# Patient Record
Sex: Male | Born: 1960 | Race: Black or African American | Hispanic: No | Marital: Single | Smoking: Current every day smoker
Health system: Southern US, Community
[De-identification: ages and names within clinical notes are randomized; demographics above are authoritative.]

## PROBLEM LIST (undated history)

## (undated) DIAGNOSIS — K259 Gastric ulcer, unspecified as acute or chronic, without hemorrhage or perforation: Secondary | ICD-10-CM

## (undated) DIAGNOSIS — I429 Cardiomyopathy, unspecified: Secondary | ICD-10-CM

## (undated) DIAGNOSIS — Z95811 Presence of heart assist device: Secondary | ICD-10-CM

## (undated) DIAGNOSIS — I509 Heart failure, unspecified: Secondary | ICD-10-CM

## (undated) DIAGNOSIS — M549 Dorsalgia, unspecified: Secondary | ICD-10-CM

## (undated) DIAGNOSIS — G8929 Other chronic pain: Secondary | ICD-10-CM

## (undated) DIAGNOSIS — Z8739 Personal history of other diseases of the musculoskeletal system and connective tissue: Secondary | ICD-10-CM

## (undated) DIAGNOSIS — I10 Essential (primary) hypertension: Secondary | ICD-10-CM

## (undated) DIAGNOSIS — K529 Noninfective gastroenteritis and colitis, unspecified: Secondary | ICD-10-CM

## (undated) DIAGNOSIS — I517 Cardiomegaly: Secondary | ICD-10-CM

## (undated) DIAGNOSIS — E119 Type 2 diabetes mellitus without complications: Secondary | ICD-10-CM

## (undated) HISTORY — DX: Other chronic pain: G89.29

## (undated) HISTORY — DX: Personal history of other diseases of the musculoskeletal system and connective tissue: Z87.39

## (undated) HISTORY — DX: Cardiomyopathy, unspecified: I42.9

## (undated) HISTORY — PX: ORCHIECTOMY: SHX2116

## (undated) HISTORY — PX: LUMBAR LAMINECTOMY/DECOMPRESSION MICRODISCECTOMY: SHX5026

## (undated) HISTORY — DX: Gastric ulcer, unspecified as acute or chronic, without hemorrhage or perforation: K25.9

## (undated) HISTORY — DX: Noninfective gastroenteritis and colitis, unspecified: K52.9

## (undated) HISTORY — DX: Dorsalgia, unspecified: M54.9

---

## 2000-08-25 ENCOUNTER — Emergency Department (HOSPITAL_COMMUNITY): Admission: EM | Admit: 2000-08-25 | Discharge: 2000-08-25 | Payer: Self-pay | Admitting: *Deleted

## 2001-01-30 ENCOUNTER — Encounter: Payer: Self-pay | Admitting: Orthopedic Surgery

## 2001-01-30 ENCOUNTER — Ambulatory Visit (HOSPITAL_COMMUNITY): Admission: RE | Admit: 2001-01-30 | Discharge: 2001-01-30 | Payer: Self-pay | Admitting: Orthopedic Surgery

## 2001-02-17 ENCOUNTER — Encounter: Admission: RE | Admit: 2001-02-17 | Discharge: 2001-02-17 | Payer: Self-pay | Admitting: Orthopedic Surgery

## 2001-02-17 ENCOUNTER — Encounter: Payer: Self-pay | Admitting: Orthopedic Surgery

## 2001-03-04 ENCOUNTER — Encounter: Payer: Self-pay | Admitting: Orthopedic Surgery

## 2001-03-04 ENCOUNTER — Encounter: Admission: RE | Admit: 2001-03-04 | Discharge: 2001-03-04 | Payer: Self-pay | Admitting: Orthopedic Surgery

## 2001-03-20 ENCOUNTER — Encounter: Admission: RE | Admit: 2001-03-20 | Discharge: 2001-03-20 | Payer: Self-pay | Admitting: Orthopedic Surgery

## 2001-03-20 ENCOUNTER — Encounter: Payer: Self-pay | Admitting: Orthopedic Surgery

## 2001-04-02 ENCOUNTER — Emergency Department (HOSPITAL_COMMUNITY): Admission: EM | Admit: 2001-04-02 | Discharge: 2001-04-02 | Payer: Self-pay | Admitting: Emergency Medicine

## 2001-04-02 ENCOUNTER — Encounter: Payer: Self-pay | Admitting: Emergency Medicine

## 2002-07-21 ENCOUNTER — Encounter: Payer: Self-pay | Admitting: Orthopedic Surgery

## 2002-07-21 ENCOUNTER — Encounter: Admission: RE | Admit: 2002-07-21 | Discharge: 2002-07-21 | Payer: Self-pay | Admitting: Orthopedic Surgery

## 2002-08-05 ENCOUNTER — Encounter: Payer: Self-pay | Admitting: Orthopedic Surgery

## 2002-08-05 ENCOUNTER — Encounter: Admission: RE | Admit: 2002-08-05 | Discharge: 2002-08-05 | Payer: Self-pay | Admitting: Orthopedic Surgery

## 2008-02-27 ENCOUNTER — Inpatient Hospital Stay (HOSPITAL_COMMUNITY): Admission: EM | Admit: 2008-02-27 | Discharge: 2008-02-28 | Payer: Self-pay | Admitting: Emergency Medicine

## 2008-05-03 ENCOUNTER — Emergency Department (HOSPITAL_COMMUNITY): Admission: EM | Admit: 2008-05-03 | Discharge: 2008-05-03 | Payer: Self-pay | Admitting: Emergency Medicine

## 2011-04-02 NOTE — Discharge Summary (Signed)
Theodore Welch, Theodore Welch              ACCOUNT NO.:  [id number]   MEDICAL RECORD NO.:  [id number]          PATIENT TYPE:  INP   LOCATION:  5030                         FACILITY:  [facility]   PHYSICIAN:  Herbie Saxon, MDDATE OF BIRTH:  Apr 27, 1961   DATE OF ADMISSION:  02/26/2008  DATE OF DISCHARGE:  02/28/2008                               DISCHARGE SUMMARY   DISCHARGE DIAGNOSES:  1. Diffuse ileus, resolved.  2. Gastroenteritis, improved.  3. Chronic back pain.  4. History of gastric ulcer.  5. History of degenerative disk disease.  6. History of seasonal allergies.   CONSULTS:  None.   PROCEDURES:  None.   RADIOLOGY:  The abdominal x-ray of February 28, 2008 shows nonspecific  bowel gas pattern.  A CT abdomen on February 26, 2008 was consistent with  diffuse  ileus.  No definite obstruction seen.  Note that abdomen x-ray  was on February 28, 2008.   HOSPITAL COURSE:  This 50 year old African American male presented to  the emergency room complaining of ongoing nausea, vomiting, and diarrhea  for about 10 days and was having severe abdominal cramps 3 days prior to  presentation.  The CT of abdomen on presentation showed diffuse ileus.  He was put on nothing oral, started on IV fluid hydration, p.r.n. Reglan  alternating with Phenergan, started on Protonix 40 mg IV daily.  He  declined nicotine patch and Lovenox for DVT prophylaxis.  However, with  bowel rest his abdominal cramps and vomiting has subsided, was started  on IV Cipro and Flagyl and diarrhea has also improved.  He is anxious to  be discharged today.  However, he was noted to be having moderate  hypertension on this admission, and he is going to be started on  amlodipine 5 mg daily on discharge.  The patient is tolerating thin  liquid diet and has been advanced to regular.  He is to be discharged to  home tonight if he tolerates lunch.   DISCHARGE CONDITION:  Unstable.   DIET:  To be heart healthy.   ACTIVITY:  No  restrictions.   FOLLOWUP:  Follow up with his primary care physician at the [facility]  in [place] in 5-7 days.   MEDICATIONS ON DISCHARGE:  1. Cipro 500 mg twice daily for 5 days.  2. Flagyl 500 mg t.i.d. for 5 days.  3. Imodium 2 mg p.o. every 8 hours for nausea.  4. Prilosec 20 p.o. daily.  5. Phenergan 12.5 mg p.o. every 8 hours as needed.  6. Amlodipine 5 mg p.o. daily.   His blood count needs to be monitored as an outpatient by the primary  care physician.  The patient has been counseled extensively on the need  to stop smoking cigarettes.   PHYSICAL EXAMINATION:  GENERAL:  On examination today, he is a middle-  aged male in no acute distress.  VITAL SIGNS:  Temperature is 98, pulse is 58, respiratory rate is 18,  and blood pressure is 153/94.  HEENT:  Pupils are equal and reacting to light and accommodation.  NECK:  Supple.  Oropharynx and the pharynx  are clear.  HEAD:  Atraumatic and normocephalic.  There is no elevated JVD.  No  thyromegaly.  No carotid bruits.  No submandibular lymphadenopathy.  CHEST:  Clinically clear.  No chest wall deformity or tenderness on the  ribcage.  Heart sounds 1 and 2, regular, and no murmurs, no heaves, no  thrill.  ABDOMEN:  Soft, nontender, no organomegaly.  Bowel sounds are  normoactive.  Inguinal orifices are patent.  NEUROLOGIC:  He is alert and oriented to time, place, and person.  He is  5 globally.  Deep tendon reflexes are 2+ globally.  Peripheral pulses  present.  No pedal edema.   LABORATORY DATA:  Sodium 137, potassium 4.1, chloride 107, bicarbonate  24, glucose 108, BUN 8, creatinine 1.0.  WBC 8.9, hematocrit 38.6, and  platelet count is 250.   Discharge greater than 30 minutes.      Herbie Saxon, MD  Electronically Signed     MIO/MEDQ  D:  02/28/2008  T:  02/29/2008  Job:  [id number]

## 2011-04-02 NOTE — H&P (Signed)
NAMEBRODRICK, CURRAN              ACCOUNT NO.:  [id number]   MEDICAL RECORD NO.:  [id number]          PATIENT TYPE:  INP   LOCATION:  5030                         FACILITY:  [facility]   PHYSICIAN:  Mobolaji B. Bakare, M.D.DATE OF BIRTH:  [birth year]   DATE OF ADMISSION:  02/26/2008  DATE OF DISCHARGE:                              HISTORY & PHYSICAL   PRIMARY CARE PHYSICIAN:  Unassigned.  Patient goes to [place] in [place].   CHIEF COMPLAINT:  Nausea, vomiting, diarrhea for about 10 days.   HISTORY OF PRESENTING COMPLAINT:  [name] is a 50 year old African-  American male with medical history of chronic back pain, degenerative  disk disease, and gastric ulcer about six years ago.  He was in his  usual state of health until last week when he developed nausea,  vomiting, diarrhea, associated with fever.  There was a history of a  sick contact.  These symptoms appeared to have been waxing and waning.  The abdominal pain especially was crampy in nature, associated with  nausea and vomiting, but he also had diarrhea and this lasted for about  two days last week.  He got somewhat better over the weekend, then the  symptoms recurred again on Monday.  He continues to have nausea and  abdominal pain, but the diarrhea seems to have subsided since three days  ago.  His current problem now is nausea and abdominal cramps.  He had a  CT scan of the abdomen done in the emergency room, which was negative  for intestinal obstruction.  Patient has an ileus noted on this CT.  His  potassium is 3.6.  He currently denies anymore fever.   REVIEW OF SYSTEMS:  No shortness of breath, chest pain, headaches.  There is no dysuria or increasing frequency of micturition.  Patient has  been unable to tolerate p.o. meals at all.   PAST MEDICAL HISTORY:  1. Chronic back pain.  2. A history of gastric ulcer six years ago.  Apparently from      patient's description, it was diagnosed on barium swallow and  follow-through.  3. History of allergies.  4. Degenerative disk disease.   PAST SURGICAL HISTORY:  Left testicular excision, likely secondary to  torsion, from patient's description.   CURRENT MEDICATIONS:  1. Acetaminophen 650 mg p.r.n.  2. Tramadol/acetaminophen 50 mg b.i.d.  3. Baclofen 10 mg t.i.d.  4. Chlorpheniramine 4 mg q.8h. p.r.n.   ALLERGIES:  No known drug allergies.   FAMILY HISTORY:  Both parents are deceased.  Father passed away at the  age of 25 from probable MI.  Mother had brain aneurysm in early 26s as  well.   SOCIAL HISTORY:  Patient is laid off.  He was a Naval architect.  He smokes  half a pack of cigarettes a day.  He has been smoking for about 25  years.  He is a retired Education administrator.  He lives with family.  Additionally,  he drinks alcohol, 1-2 beers per weekend.  He is single.   PHYSICAL EXAMINATION:  INITIAL VITALS:  Temperature 97.6, blood pressure  144/94, pulse 78,  respiratory rate 18, O2 sats 96%.  GENERAL:  Patient is awake, alert and oriented to time, place, and  person.  Normocephalic and atraumatic head.  Pupils are equal, round and reactive  to light.  Extraocular muscle movement intact.  No elevated JVD.  No  carotid bruit.  Mucous membranes moist.  No oral thrush.  LUNGS:  Clear clinically to auscultation.  CVS:  S1 and S2.  No murmur or gallop.  No rub.  ABDOMEN:  Nondistended.  It is soft.  There is mild periumbilical  tenderness without guarding or rebound.  Bowel sounds are hypoactive.  No inguinal hernias noted.  EXTREMITIES:  No pedal edema or calf tenderness.  Dorsalis pedis pulses  palpable bilaterally.  CNS:  No focal neurological deficits.   LABORATORY DATA:  White cell count 11.2, hemoglobin 13.5, hematocrit  44.5, platelets 305.  Normal differential.  Sodium 126, potassium 3.6,  chloride 103, CO2 25, glucose 104, BUN 11, creatinine 0.93, calcium 9.1,  total protein 6.7, albumin 4.1, AST 60, ALT 16, lipase 30.   X-ray of abdomen  showed air/fluid levels in the right lower quadrant,  probable partial small bowel obstruction versus reflex ileus.   CT scan of the abdomen and pelvis showed fluid in both large and small  bowels, that is ileus.  The pattern is most consistent with diffuse  ileus.  On CT of the abdomen, no definite bowel obstruction was seen.  Urinary bladder is unremarkable.  Prostate is normal in size.  No  colonic abnormality noted.   ASSESSMENT/PLAN:  [name] is a 50 year old African-American male  presenting with nausea, vomiting, and diarrhea, which has been  intermittent the last 10 days.__________ does include nausea and  abdominal cramp.  An abdominal x-ray and CT scan of the abdomen suggests  ileus.  Potassium is on the low side of normal at 3.6.  He is  hemodynamically stable.  He has mild leukocytosis.  He will be admitted  for further management.   ADMISSION DIAGNOSES:  1. Nausea, vomiting, diarrhea:  Likely viral gastroenteritis.  Patient      will be placed on a clear-liquid diet, IV fluids of normal saline      at 150 cc/hr with 20 mEq of potassium in the fluid x2 liters, then      reduce rates to 125 cc/hr.  Will give Phenergan 12.5 mg q.4h.      p.r.n.  Protonix 40 mg daily.  2. Ileus, likely secondary to gastroenteritis.  Would optimize      potassium and supplement potassium in IV fluids.  Will ambulate      p.r.n.  Give low dose Reglan at 5 mg q.8h., noting that patient      does not have anymore diarrhea at this time but would discontinue      Reglan should diarrhea recur.  3. Tobacco abuse:  Will give nicotine patch 14 mg daily and offer      tobacco cessation counseling.      Mobolaji B. Corky Downs, M.D.  Electronically Signed     MBB/MEDQ  D:  02/27/2008  T:  02/27/2008  Job:  [id number]

## 2011-08-13 LAB — COMPREHENSIVE METABOLIC PANEL
ALT: 11
ALT: 16
AST: 16
Albumin: 3.2 — ABNORMAL LOW
Alkaline Phosphatase: 86
Calcium: 9.1
Chloride: 109
Creatinine, Ser: 0.93
GFR calc Af Amer: >60
GFR calc Af Amer: >60
Glucose, Bld: 104 — ABNORMAL HIGH
Potassium: 4.1
Sodium: 136
Sodium: 142
Total Bilirubin: 1.3 — ABNORMAL HIGH
Total Protein: 5.2 — ABNORMAL LOW
Total Protein: 6.7

## 2011-08-13 LAB — URINALYSIS, ROUTINE W REFLEX MICROSCOPIC
Glucose, UA: NEGATIVE
Ketones, ur: 15 — AB
Nitrite: NEGATIVE
Specific Gravity, Urine: >1.046 — ABNORMAL HIGH
pH: 6

## 2011-08-13 LAB — CBC
Hemoglobin: 12.9 — ABNORMAL LOW
MCHC: 33.3
MCHC: 34.7
MCV: 90.2
Platelets: 250
RDW: 13.6
RDW: 13.8

## 2011-08-13 LAB — DIFFERENTIAL
Eosinophils Absolute: 0.4
Lymphocytes Relative: 19
Lymphs Abs: 2.1
Monocytes Relative: 9
Neutrophils Relative %: 67

## 2011-08-13 LAB — BASIC METABOLIC PANEL
BUN: 8
CO2: 24
Calcium: 8.3 — ABNORMAL LOW
GFR calc non Af Amer: >60
Glucose, Bld: 108 — ABNORMAL HIGH

## 2011-08-15 LAB — URINALYSIS, ROUTINE W REFLEX MICROSCOPIC
Bilirubin Urine: NEGATIVE
Glucose, UA: NEGATIVE
Ketones, ur: NEGATIVE
Protein, ur: NEGATIVE
Urobilinogen, UA: 0.2

## 2011-08-15 LAB — CBC
Hemoglobin: 15.9
MCHC: 34
RBC: 5.13
RDW: 13.3

## 2011-08-15 LAB — COMPREHENSIVE METABOLIC PANEL
ALT: 31
AST: 29
Alkaline Phosphatase: 105
Calcium: 9.3
GFR calc Af Amer: >60
Glucose, Bld: 120 — ABNORMAL HIGH
Potassium: 3.9
Sodium: 139
Total Protein: 6.9

## 2011-08-15 LAB — DIFFERENTIAL
Basophils Relative: 1
Eosinophils Absolute: 0.5
Eosinophils Relative: 5
Lymphs Abs: 1.9
Monocytes Absolute: 1
Monocytes Relative: 9

## 2011-08-15 LAB — URINE MICROSCOPIC-ADD ON

## 2013-11-15 DIAGNOSIS — M48062 Spinal stenosis, lumbar region with neurogenic claudication: Secondary | ICD-10-CM | POA: Insufficient documentation

## 2013-12-07 DIAGNOSIS — Z9889 Other specified postprocedural states: Secondary | ICD-10-CM | POA: Insufficient documentation

## 2014-01-24 DIAGNOSIS — M5416 Radiculopathy, lumbar region: Secondary | ICD-10-CM | POA: Insufficient documentation

## 2017-01-27 ENCOUNTER — Encounter: Payer: Non-veteran care | Attending: Physical Medicine & Rehabilitation

## 2017-01-27 ENCOUNTER — Encounter: Payer: Self-pay | Admitting: Physical Medicine & Rehabilitation

## 2017-01-27 ENCOUNTER — Ambulatory Visit (HOSPITAL_BASED_OUTPATIENT_CLINIC_OR_DEPARTMENT_OTHER): Payer: Non-veteran care | Admitting: Physical Medicine & Rehabilitation

## 2017-01-27 VITALS — BP 116/100 | HR 77 | Resp 14

## 2017-01-27 DIAGNOSIS — G894 Chronic pain syndrome: Secondary | ICD-10-CM | POA: Diagnosis not present

## 2017-01-27 DIAGNOSIS — F17210 Nicotine dependence, cigarettes, uncomplicated: Secondary | ICD-10-CM | POA: Insufficient documentation

## 2017-01-27 DIAGNOSIS — Z5181 Encounter for therapeutic drug level monitoring: Secondary | ICD-10-CM | POA: Insufficient documentation

## 2017-01-27 DIAGNOSIS — M961 Postlaminectomy syndrome, not elsewhere classified: Secondary | ICD-10-CM | POA: Insufficient documentation

## 2017-01-27 DIAGNOSIS — M797 Fibromyalgia: Secondary | ICD-10-CM | POA: Diagnosis not present

## 2017-01-27 DIAGNOSIS — M4802 Spinal stenosis, cervical region: Secondary | ICD-10-CM | POA: Insufficient documentation

## 2017-01-27 DIAGNOSIS — M791 Myalgia: Secondary | ICD-10-CM

## 2017-01-27 DIAGNOSIS — Z79899 Other long term (current) drug therapy: Secondary | ICD-10-CM

## 2017-01-27 DIAGNOSIS — M7918 Myalgia, other site: Secondary | ICD-10-CM | POA: Insufficient documentation

## 2017-01-27 NOTE — Patient Instructions (Signed)
Will need MRI from [place], will schedule lumbar medial branch blocks  May also require lumbar epidural if MBB not  Will need notes from neurosurgery from [facility] after evaluation   Trigger point injections perfornmed today

## 2017-01-27 NOTE — Progress Notes (Signed)
Subjective:    Patient ID: Theodore Welch, male    DOB: 07/06/1961, 56 y.o.   MRN: [id number] Consult requested by [facility], Dr [name] HPI CC Low back and  Bilateral neck/shoulder pain Secondary c/o pain in RLE  56 yo with hx of Low back pain and LLE since 2008, underwent bilateral foraminotony L4-5 and Left hemilaminectomy 11/23/2013 Patient has been evaluated by numerous physicians including primary care at the [facility], physical medicine and rehabilitation at the [facility], neurosurgery at [facility], upcoming appointment neurosurgery,  [facility]. Recent cervical MRI reviewed. Upcoming neurosurgery appointment is to evaluate for cervical ACDF.   Prior hx of syncope, poor balance, had 2 falls last week, while using cane rather than walker  Prior MRI of the lumbar spine in [facility] in 2002 demonstrated 6 lumbar vertebrae, last vertebrae transitional.  Patient indicates that his last MRI was performed through the [place] within the last 2 years, but does not have the report.  Had epidurals at L4-5 as well as L5- 6, these were performed at [facility] imaging in 2003  On disability from the [place] for Right shoulder rotator cuff but didn't have surgery Has had bilateral CTR, complains of dropping objects in both hands on a chronic basis.  Recent MRI of the cervical spine performed at the [place] in [place], 11/01/2016. This was compared to a prior MRI of the cervical spine which performed at [facility] on 09/07/2014.  Summary C2-C3. Left facet degenerative changes. No stenosis C3-4: Disc osteophyte complex worsened compared to prior study, mild central stenosis, no foraminal stenosis, left facet degenerative change. C4-5: Disc osteophyte complex contacting anterior cervical cord. Minimal flattening, moderate central stenosis, bilateral facet degenerative changes. Mild left foraminal stenosis C5-C6: Disc osteophyte complex eccentric left paracentral minimal  flattening left anterior cervical cord. Bilateral facet degenerative changes, mild to moderate central canal stenosis, moderate left foraminal stenosis. C6-7 disc osteophyte complex, mild central stenosis, mild right and moderate to severe left neural foraminal stenosis. C7-T1: No central stenosis. Possible mild left neural foraminal stenosis.  Physical therapy in 2015 and 2016. This was for neck pain as well as low back pain.  Prior back injections per imaging in 2008, which were helpful. Prior  injections performed by physical medicine rehabilitation at the [facility]. These were in several locations actually in both shoulder and neck. Likely trigger point injections  Smoked cannabis Saturday  Pain Inventory Average Pain 9 Pain Right Now 7 My pain is sharp, tingling and aching  In the last 24 hours, has pain interfered with the following? General activity 7 Relation with others 5 Enjoyment of life 4 What TIME of day is your pain at its worst? morning evening and night Sleep (in general) Poor  Pain is worse with: walking, bending, sitting and standing Pain improves with: medication and injections Relief from Meds: 5  Mobility walk with assistance use a walker how many minutes can you walk? 10 ability to climb steps?  no do you drive?  no  Function disabled: date disabled May 2015 I need assistance with the following:  dressing and household duties  Neuro/Psych numbness trouble walking dizziness anxiety  Prior Studies Any changes since last visit?  no  Physicians involved in your care Any changes since last visit?  no Primary care [employer] [facility]   No family history on file. Social History   Social History  . Marital status: Single    Spouse name:  N/A  . Number of children: N/A  . Years of education: N/A   Social History Main Topics  . Smoking status: Current Every Day Smoker    Types: Cigarettes    Start date: 2003  . Smokeless  tobacco: Never Used  . Alcohol use None  . Drug use: Unknown  . Sexual activity: Not Asked   Other Topics Concern  . None   Social History Narrative  . None   No past surgical history on file. No past medical history on file. BP (!) 116/100   Pulse 77   Resp 14   SpO2 98%   Opioid Risk Score:   Fall Risk Score:  `1  Depression screen PHQ 2/9  Depression screen PHQ 2/9 01/27/2017  Decreased Interest 0  Down, Depressed, Hopeless 2  PHQ - 2 Score 2  Altered sleeping 3  Tired, decreased energy 1  Change in appetite 3  Feeling bad or failure about yourself  0  Trouble concentrating 1  Moving slowly or fidgety/restless 0  Suicidal thoughts 0  PHQ-9 Score 10  Difficult doing work/chores Somewhat difficult   Review of Systems  Constitutional: Positive for diaphoresis.  HENT: Negative.   Eyes: Negative.   Respiratory: Positive for cough.   Cardiovascular: Negative.   Gastrointestinal: Negative.   Endocrine:       High blood sugar  Genitourinary: Negative.   Musculoskeletal: Positive for gait problem.  Skin: Negative.   Allergic/Immunologic: Positive for environmental allergies.  Neurological: Positive for dizziness and numbness.  Hematological: Negative.   Psychiatric/Behavioral: Positive for dysphoric mood. The patient is nervous/anxious.   All other systems reviewed and are negative.      Objective:   Physical Exam  Constitutional: He is oriented to person, place, and time. He appears well-developed and well-nourished.  HENT:  Head: Normocephalic and atraumatic.  Right Ear: External ear normal.  Left Ear: External ear normal.  Wears hearing aides, bilateral  Eyes: Conjunctivae and EOM are normal. Pupils are equal, round, and reactive to light.  Neck: Normal range of motion.  Cardiovascular: Normal rate, regular rhythm and normal heart sounds.   No murmur heard. Pulmonary/Chest: Effort normal and breath sounds normal. No respiratory distress. He has no  wheezes.  Abdominal: Soft. Bowel sounds are normal. He exhibits distension. There is no tenderness.  Musculoskeletal:       Right hip: Normal.       Left hip: Normal.       Cervical back: He exhibits decreased range of motion and tenderness.       Thoracic back: He exhibits decreased range of motion and tenderness.       Lumbar back: He exhibits decreased range of motion and tenderness.  Decreased lumbar range of motion 25% flexion, extension, lateral bending and rotation.  Neurological: He is alert and oriented to person, place, and time. Gait abnormal.  Reflex Scores:      Tricep reflexes are 3+ on the right side and 3+ on the left side.      Bicep reflexes are 3+ on the right side and 3+ on the left side.      Brachioradialis reflexes are 3+ on the right side and 3+ on the left side.      Patellar reflexes are 3+ on the right side and 3+ on the left side.      Achilles reflexes are 2+ on the right side and 2+ on the left side. Overflow in left upper limb, finger flexion with testing.  Brachioradialis reflex, negative Hoffmann's bilaterally  Nursing note and vitals reviewed.  Intact sensation to pinprick bilateral C5, C6 and left C7, reduced right C7, intact, bilateral C8, L2, L3, L4, reduced bilateral L5-S1, has stocking distribution pinprick reduction both lower extremities  Motor strength is 5/5 bilateral deltoids, biceps, triceps, grip, hip flexor, knee extensor, ankle dorsiflexor, plantar flexor  Gait using a rolling walker, for flex gait. No evidence to drag or knee instability  Tenderness to palpation bilateral trapezius, bilateral levator scapula, bilateral infraspinatus. Also has some tenderness around the lumbar  11/18 fibromyalgia tender points. Positive  Negative Tinel's bilaterally at the wrist negative straight leg raising bilaterally     Assessment & Plan:  1.  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  Pain not responsive to  conservative care, will trial MBB bilateral L6-S1 , may need to trial more proximal levels if needed  If painful segment identified and only gets temporary response with medial branch blocks, would proceed to radiofrequency neurotomies  RLE radicular pain may respond to L4-5 ESI but would need most recent MRI to review first  2. Neck pain has cervical spinal stenosis with increased deep tendon reflexes likely reflective of a mild myelopathy, undergoing neurosurgical evaluation for decompressive surgery. We will hold off on any spinal injections in the cervical spine for now.  3. Shoulder and neck pain. Much of this is myofascial, has had good response previously with trigger point injections. We will repeat today Trigger Point Injection  Indication: cervical and periscapular Myofascial pain not relieved by medication management and other conservative care.  Informed consent was obtained after describing risk and benefits of the procedure with the patient, this includes bleeding, bruising, infection and medication side effects.  The patient wishes to proceed and has given written consent.  The patient was placed in a seated  position.  The bilateral trapezius,Levator and  infraspinatus area was marked and prepped with Betadine.  It was entered with a 25-gauge 1-1/2 inch needle and 1 mL of 1% lidocaine was injected into each of 6 trigger points, after negative draw back for blood.  The patient tolerated the procedure well.  Post procedure instructions were given.  . 4. Widespread body pain, most likely has fibromyalgia syndrome, may benefit from Lyrica in place of gabapentin, already on duloxetine

## 2017-01-31 LAB — TOXASSURE SELECT,+ANTIDEPR,UR

## 2017-02-25 ENCOUNTER — Ambulatory Visit: Payer: Self-pay | Admitting: Physical Medicine & Rehabilitation

## 2017-03-03 ENCOUNTER — Telehealth: Payer: Self-pay | Admitting: Registered Nurse

## 2017-03-03 NOTE — Telephone Encounter (Signed)
Mr. Mcindoe had a UDS performed on 01/27/2017, his UDS is inconsistent. +THC.

## 2017-03-03 NOTE — Telephone Encounter (Signed)
-----   Message from Erick Colace, MD sent at 03/03/2017 12:18 PM EDT ----- Non narcotic ----- Message ----- From: Jones Bales, NP Sent: 03/03/2017  10:11 AM To: Erick Colace, MD  Dr. Wynn Banker you seen this patient on 01/27/2017, new patient. + THC, how would you like to proceed.

## 2017-03-04 ENCOUNTER — Telehealth: Payer: Self-pay | Admitting: *Deleted

## 2017-03-04 NOTE — Telephone Encounter (Signed)
Notified Theodore Welch he will be non narcotic treatment ( injections etc) but Dr Wynn Banker will not prescribe narcotics due to UDS positive for THC.  He acknowledges.

## 2017-03-24 ENCOUNTER — Encounter: Payer: Self-pay | Admitting: Physical Medicine & Rehabilitation

## 2017-03-24 ENCOUNTER — Ambulatory Visit (HOSPITAL_BASED_OUTPATIENT_CLINIC_OR_DEPARTMENT_OTHER): Payer: Non-veteran care | Admitting: Physical Medicine & Rehabilitation

## 2017-03-24 ENCOUNTER — Encounter: Payer: Non-veteran care | Attending: Physical Medicine & Rehabilitation

## 2017-03-24 VITALS — BP 142/92 | HR 70

## 2017-03-24 DIAGNOSIS — M797 Fibromyalgia: Secondary | ICD-10-CM | POA: Insufficient documentation

## 2017-03-24 DIAGNOSIS — M47816 Spondylosis without myelopathy or radiculopathy, lumbar region: Secondary | ICD-10-CM | POA: Diagnosis not present

## 2017-03-24 DIAGNOSIS — G894 Chronic pain syndrome: Secondary | ICD-10-CM | POA: Diagnosis present

## 2017-03-24 DIAGNOSIS — M961 Postlaminectomy syndrome, not elsewhere classified: Secondary | ICD-10-CM | POA: Diagnosis not present

## 2017-03-24 DIAGNOSIS — M4802 Spinal stenosis, cervical region: Secondary | ICD-10-CM | POA: Diagnosis not present

## 2017-03-24 DIAGNOSIS — F17210 Nicotine dependence, cigarettes, uncomplicated: Secondary | ICD-10-CM | POA: Diagnosis not present

## 2017-03-24 DIAGNOSIS — Z79899 Other long term (current) drug therapy: Secondary | ICD-10-CM | POA: Diagnosis not present

## 2017-03-24 DIAGNOSIS — Z5181 Encounter for therapeutic drug level monitoring: Secondary | ICD-10-CM | POA: Diagnosis not present

## 2017-03-24 NOTE — Progress Notes (Signed)
Bilateral Lumbar L5  medial branch blocks and S1dorsal ramus injection under fluoroscopic guidance  Indication: Lumbar pain which is not relieved by medication management or other conservative care and interfering with self-care and mobility. Note that pt has 6 lumbar vertebrae  Informed consent was obtained after describing risks and benefits of the procedure with the patient, this includes bleeding, infection, paralysis and medication side effects.  The patient wishes to proceed and has given written consent.  The patient was placed in prone position.  The lumbar area was marked and prepped with Betadine.  One mL of 1% lidocaine was injected into each of 4 areas into the skin and subcutaneous tissue.  Then a 22-gauge 3.5in spinal needle was inserted targeting the junction of the left S1 superior articular process and sacral ala junction. Needle was advanced under fluoroscopic guidance.  Bone contact was made.  Isovue 200 was injected x 0.5 mL demonstrating no intravascular uptake.  Then a solution  of 2% MPF lidocaine was injected x 0.5 mL.  Then the left L6 superior articular process in transverse process junction was targeted.  Bone contact was made.  Isovue 200 was injected x 0.5 mL demonstrating no intravascular uptake. Then a solution containing  2% MPF lidocaine was injected x 0.5 mL.  T.  This same procedure was performed on the right side using the same needle, technique and injectate.  Patient tolerated procedure well.  Post procedure instructions were given.

## 2017-03-24 NOTE — Patient Instructions (Signed)

## 2017-03-24 NOTE — Progress Notes (Signed)
  PROCEDURE RECORD [facility]   Name: Theodore Welch DOB:[birth year] MRN: [id number]  Date:03/24/2017  Physician: Claudette Laws, MD    Nurse/CMA: Bright CMA  Allergies: No Known Allergies  Consent Signed: Yes.    Is patient diabetic? Yes.    CBG today? 134  Pregnant: No. LMP: No LMP for male patient. (age 58-55)  Anticoagulants: no Anti-inflammatory: no Antibiotics: no  Procedure:bilateral l5-s1 Position: Prone Start Time:1012am  End Time1030:  Fluoro Time: 45s  RN/CMA Hales LPN Bright CMA    Time 941am 1024am    BP 142/92 151/99    Pulse 70 70    Respirations 16 16    O2 Sat 98 98    S/S 6 6    Pain Level 6/10 6/10     D/C home with [place] Dec sister, patient A & O X 3, D/C instructions reviewed, and sits independently.

## 2017-04-22 ENCOUNTER — Encounter: Payer: Self-pay | Admitting: Physical Medicine & Rehabilitation

## 2017-04-22 ENCOUNTER — Ambulatory Visit (HOSPITAL_BASED_OUTPATIENT_CLINIC_OR_DEPARTMENT_OTHER): Payer: Non-veteran care | Admitting: Physical Medicine & Rehabilitation

## 2017-04-22 ENCOUNTER — Encounter: Payer: Non-veteran care | Attending: Physical Medicine & Rehabilitation

## 2017-04-22 VITALS — BP 147/87 | HR 75

## 2017-04-22 DIAGNOSIS — Z5181 Encounter for therapeutic drug level monitoring: Secondary | ICD-10-CM | POA: Insufficient documentation

## 2017-04-22 DIAGNOSIS — M961 Postlaminectomy syndrome, not elsewhere classified: Secondary | ICD-10-CM | POA: Diagnosis not present

## 2017-04-22 DIAGNOSIS — G894 Chronic pain syndrome: Secondary | ICD-10-CM | POA: Insufficient documentation

## 2017-04-22 DIAGNOSIS — F17210 Nicotine dependence, cigarettes, uncomplicated: Secondary | ICD-10-CM | POA: Diagnosis not present

## 2017-04-22 DIAGNOSIS — M4802 Spinal stenosis, cervical region: Secondary | ICD-10-CM | POA: Diagnosis not present

## 2017-04-22 DIAGNOSIS — Z79899 Other long term (current) drug therapy: Secondary | ICD-10-CM | POA: Diagnosis not present

## 2017-04-22 DIAGNOSIS — M797 Fibromyalgia: Secondary | ICD-10-CM | POA: Insufficient documentation

## 2017-04-22 DIAGNOSIS — M47816 Spondylosis without myelopathy or radiculopathy, lumbar region: Secondary | ICD-10-CM | POA: Diagnosis not present

## 2017-04-22 NOTE — Patient Instructions (Signed)
Please call for another L5-S1 bilateral medial branch block. Once back pain starts increasing again

## 2017-04-22 NOTE — Progress Notes (Signed)
Subjective:    Patient ID: Theodore Welch, male    DOB: 1961/09/07, 56 y.o.   MRN: [id number]  HPI BIlateral  L5 S1 MBB performed one month ago resulted in pain relief  8/10 pre injection , 5/10 post injection  Still taking Gabapentin 600mg  TID Still taking meloxicam 7.5mg  BID Methocarbamol 750mg   TID Pain Inventory Average Pain 8 Pain Right Now 5 My pain is sharp, dull and aching  In the last 24 hours, has pain interfered with the following? General activity 6 Relation with others 7 Enjoyment of life 7 What TIME of day is your pain at its worst? morning evening and night Sleep (in general) NA  Pain is worse with: walking, bending and standing Pain improves with: medication and injections Relief from Meds: 6  Mobility walk with assistance use a walker how many minutes can you walk? 5-10 ability to climb steps?  no do you drive?  no  Function disabled: date disabled 2013 I need assistance with the following:  household duties  Neuro/Psych weakness numbness trouble walking dizziness anxiety  Prior Studies Any changes since last visit?  no  Physicians involved in your care Any changes since last visit?  no   No family history on file. Social History   Social History  . Marital status: Single    Spouse name: N/A  . Number of children: N/A  . Years of education: N/A   Social History Main Topics  . Smoking status: Current Every Day Smoker    Types: Cigarettes    Start date: 2003  . Smokeless tobacco: Never Used  . Alcohol use None  . Drug use: Unknown  . Sexual activity: Not Asked   Other Topics Concern  . None   Social History Narrative  . None   No past surgical history on file. No past medical history on file. BP (!) 147/87   Pulse 75   SpO2 94%   Opioid Risk Score:   Fall Risk Score:  `1  Depression screen PHQ 2/9  Depression screen [facility] 2/9 04/22/2017 01/27/2017  Decreased Interest 0 0  Down, Depressed, Hopeless 0 2  PHQ - 2  Score 0 2  Altered sleeping - 3  Tired, decreased energy - 1  Change in appetite - 3  Feeling bad or failure about yourself  - 0  Trouble concentrating - 1  Moving slowly or fidgety/restless - 0  Suicidal thoughts - 0  PHQ-9 Score - 10  Difficult doing work/chores - Somewhat difficult    Review of Systems  Constitutional: Negative.   HENT: Negative.   Eyes: Negative.   Respiratory: Positive for apnea and shortness of breath.   Cardiovascular: Negative.   Gastrointestinal: Negative.   Endocrine:       High blood sugars  Genitourinary: Negative.   Musculoskeletal: Positive for gait problem.  Skin: Negative.   Neurological: Positive for dizziness and numbness.  Hematological: Negative.   Psychiatric/Behavioral: The patient is nervous/anxious.   All other systems reviewed and are negative.      Objective:   Physical Exam  Constitutional: He is oriented to person, place, and time. He appears well-developed and well-nourished.  HENT:  Head: Normocephalic and atraumatic.  Eyes: Conjunctivae and EOM are normal. Pupils are equal, round, and reactive to light.  Neurological: He is alert and oriented to person, place, and time.  Psychiatric: He has a normal mood and affect.  Nursing note and vitals reviewed.   Lumbar ROM reduced Flex, ext ,  lateral rotation and bending Motor strength is 5/5 bilateral hip flexor, knee extensor, ankle dorsal flexor     Assessment & Plan:  1. 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 Good results from medial branch blocks, these are still effective. We discussed the difficulty in predicting duration response. He will call when another injection is needed.

## 2017-06-16 ENCOUNTER — Ambulatory Visit (HOSPITAL_BASED_OUTPATIENT_CLINIC_OR_DEPARTMENT_OTHER): Payer: Non-veteran care | Admitting: Physical Medicine & Rehabilitation

## 2017-06-16 ENCOUNTER — Encounter: Payer: Non-veteran care | Attending: Physical Medicine & Rehabilitation

## 2017-06-16 ENCOUNTER — Encounter: Payer: Self-pay | Admitting: Physical Medicine & Rehabilitation

## 2017-06-16 VITALS — BP 128/83 | HR 79 | Resp 14

## 2017-06-16 DIAGNOSIS — M797 Fibromyalgia: Secondary | ICD-10-CM | POA: Insufficient documentation

## 2017-06-16 DIAGNOSIS — M47816 Spondylosis without myelopathy or radiculopathy, lumbar region: Secondary | ICD-10-CM | POA: Diagnosis not present

## 2017-06-16 DIAGNOSIS — G894 Chronic pain syndrome: Secondary | ICD-10-CM | POA: Diagnosis not present

## 2017-06-16 DIAGNOSIS — Z79899 Other long term (current) drug therapy: Secondary | ICD-10-CM | POA: Insufficient documentation

## 2017-06-16 DIAGNOSIS — M4802 Spinal stenosis, cervical region: Secondary | ICD-10-CM | POA: Diagnosis not present

## 2017-06-16 DIAGNOSIS — M961 Postlaminectomy syndrome, not elsewhere classified: Secondary | ICD-10-CM | POA: Insufficient documentation

## 2017-06-16 DIAGNOSIS — F17210 Nicotine dependence, cigarettes, uncomplicated: Secondary | ICD-10-CM | POA: Insufficient documentation

## 2017-06-16 DIAGNOSIS — Z5181 Encounter for therapeutic drug level monitoring: Secondary | ICD-10-CM | POA: Diagnosis not present

## 2017-06-16 NOTE — Patient Instructions (Signed)

## 2017-06-16 NOTE — Progress Notes (Signed)
Bilateral Lumbar L5  medial branch blocks and S1dorsal ramus injection under fluoroscopic guidance  Indication: Lumbar pain which is not relieved by medication management or other conservative care and interfering with self-care and mobility. Note that pt has 6 lumbar vertebrae  Informed consent was obtained after describing risks and benefits of the procedure with the patient, this includes bleeding, infection, paralysis and medication side effects.  The patient wishes to proceed and has given written consent.  The patient was placed in prone position.  The lumbar area was marked and prepped with Betadine.  One mL of 1% lidocaine was injected into each of 4 areas into the skin and subcutaneous tissue.  Then a 22-gauge 3.5in spinal needle was inserted targeting the junction of the left S1 superior articular process and sacral ala junction. Needle was advanced under fluoroscopic guidance.  Bone contact was made.  Isovue 200 was injected x 0.5 mL demonstrating no intravascular uptake.  Then a solution  of 2% MPF lidocaine was injected x 0.5 mL.  Then the left L6 superior articular process in transverse process junction was targeted.  Bone contact was made.  Isovue 200 was injected x 0.5 mL demonstrating no intravascular uptake. Then a solution containing  2% MPF lidocaine was injected x 0.5 mL.  T.  This same procedure was performed on the right side using the same needle, technique and injectate.  Patient tolerated procedure well.  Post procedure instructions were given. 

## 2017-06-16 NOTE — Progress Notes (Signed)
  PROCEDURE RECORD [facility]   Name: Theodore Welch DOB:1961-10-23 MRN: [id number]  Date:06/16/2017  Physician: Claudette Laws, MD    Nurse/CMA: Daleiza Bacchi, CMA  Allergies: No Known Allergies  Consent Signed: Yes.    Is patient diabetic? Yes.    CBG today? ?  Pregnant: No. LMP: No LMP for male patient. (age 58-55)  Anticoagulants: no Anti-inflammatory: no Antibiotics: no  Procedure: bilateral medial branch block  Position: Prone Start Time:   10:02 am    End Time: 10:09am  Fluoro Time: 31  RN/CMA Markeshia Giebel, CMA Sher Hellinger, CMA    Time 9:45am 10:13am    BP 128/83 149/96    Pulse 79 77    Respirations 14 14    O2 Sat 96 97    S/S 6 6    Pain Level 7/10 7/10     D/C home with [name], patient A & O X 3, D/C instructions reviewed, and sits independently.

## 2017-07-14 ENCOUNTER — Encounter: Payer: Non-veteran care | Attending: Physical Medicine & Rehabilitation

## 2017-07-14 ENCOUNTER — Ambulatory Visit: Payer: Non-veteran care | Admitting: Physical Medicine & Rehabilitation

## 2017-07-14 DIAGNOSIS — M4802 Spinal stenosis, cervical region: Secondary | ICD-10-CM | POA: Insufficient documentation

## 2017-07-14 DIAGNOSIS — F17210 Nicotine dependence, cigarettes, uncomplicated: Secondary | ICD-10-CM | POA: Insufficient documentation

## 2017-07-14 DIAGNOSIS — M797 Fibromyalgia: Secondary | ICD-10-CM | POA: Insufficient documentation

## 2017-07-14 DIAGNOSIS — Z79899 Other long term (current) drug therapy: Secondary | ICD-10-CM | POA: Insufficient documentation

## 2017-07-14 DIAGNOSIS — G894 Chronic pain syndrome: Secondary | ICD-10-CM | POA: Insufficient documentation

## 2017-07-14 DIAGNOSIS — M961 Postlaminectomy syndrome, not elsewhere classified: Secondary | ICD-10-CM | POA: Insufficient documentation

## 2017-07-14 DIAGNOSIS — Z5181 Encounter for therapeutic drug level monitoring: Secondary | ICD-10-CM | POA: Insufficient documentation

## 2018-05-18 ENCOUNTER — Telehealth: Payer: Self-pay | Admitting: *Deleted

## 2018-05-18 NOTE — Telephone Encounter (Signed)
REFERRAL SENT TO SCHEDULING FROM [facility] [phone] EXT [phone].

## 2018-06-19 ENCOUNTER — Ambulatory Visit: Payer: Non-veteran care | Admitting: Cardiovascular Disease

## 2018-06-20 ENCOUNTER — Other Ambulatory Visit: Payer: Self-pay

## 2018-06-20 ENCOUNTER — Emergency Department (HOSPITAL_COMMUNITY)
Admission: EM | Admit: 2018-06-20 | Discharge: 2018-06-20 | Disposition: A | Discharge status: Home / Self Care | Payer: Medicare Other | Attending: Emergency Medicine | Admitting: Emergency Medicine

## 2018-06-20 ENCOUNTER — Encounter (HOSPITAL_COMMUNITY): Payer: Self-pay | Admitting: Emergency Medicine

## 2018-06-20 ENCOUNTER — Emergency Department (HOSPITAL_COMMUNITY): Payer: Medicare Other

## 2018-06-20 DIAGNOSIS — R0789 Other chest pain: Secondary | ICD-10-CM | POA: Diagnosis present

## 2018-06-20 DIAGNOSIS — R42 Dizziness and giddiness: Secondary | ICD-10-CM | POA: Diagnosis not present

## 2018-06-20 DIAGNOSIS — R0602 Shortness of breath: Secondary | ICD-10-CM | POA: Insufficient documentation

## 2018-06-20 DIAGNOSIS — Z7982 Long term (current) use of aspirin: Secondary | ICD-10-CM | POA: Diagnosis not present

## 2018-06-20 DIAGNOSIS — I2510 Atherosclerotic heart disease of native coronary artery without angina pectoris: Secondary | ICD-10-CM | POA: Diagnosis not present

## 2018-06-20 DIAGNOSIS — Z7984 Long term (current) use of oral hypoglycemic drugs: Secondary | ICD-10-CM | POA: Diagnosis not present

## 2018-06-20 DIAGNOSIS — Z79899 Other long term (current) drug therapy: Secondary | ICD-10-CM | POA: Diagnosis not present

## 2018-06-20 DIAGNOSIS — F17210 Nicotine dependence, cigarettes, uncomplicated: Secondary | ICD-10-CM | POA: Diagnosis not present

## 2018-06-20 DIAGNOSIS — R079 Chest pain, unspecified: Secondary | ICD-10-CM

## 2018-06-20 DIAGNOSIS — I5042 Chronic combined systolic (congestive) and diastolic (congestive) heart failure: Secondary | ICD-10-CM | POA: Diagnosis not present

## 2018-06-20 HISTORY — DX: Cardiomegaly: I51.7

## 2018-06-20 HISTORY — DX: Heart failure, unspecified: I50.9

## 2018-06-20 LAB — BASIC METABOLIC PANEL
ANION GAP: 17 — AB (ref 5–15)
BUN: 19 mg/dL (ref 6–20)
CO2: 23 mmol/L (ref 22–32)
Calcium: 8.9 mg/dL (ref 8.9–10.3)
Chloride: 97 mmol/L — ABNORMAL LOW (ref 98–111)
Creatinine, Ser: 1.24 mg/dL (ref 0.61–1.24)
Glucose, Bld: 109 mg/dL — ABNORMAL HIGH (ref 70–99)
Potassium: 3.6 mmol/L (ref 3.5–5.1)
SODIUM: 137 mmol/L (ref 135–145)

## 2018-06-20 LAB — CBC
HCT: 44.4 % (ref 39.0–52.0)
HEMOGLOBIN: 13.8 g/dL (ref 13.0–17.0)
MCH: 28.6 pg (ref 26.0–34.0)
MCHC: 31.1 g/dL (ref 30.0–36.0)
MCV: 92.1 fL (ref 78.0–100.0)
Platelets: 141 10*3/uL — ABNORMAL LOW (ref 150–400)
RBC: 4.82 MIL/uL (ref 4.22–5.81)
RDW: 14.5 % (ref 11.5–15.5)
WBC: 9.3 10*3/uL (ref 4.0–10.5)

## 2018-06-20 LAB — I-STAT TROPONIN, ED
TROPONIN I, POC: 0.04 ng/mL (ref 0.00–0.08)
Troponin i, poc: 0.01 ng/mL (ref 0.00–0.08)

## 2018-06-20 MED ORDER — SODIUM CHLORIDE 0.9 % IV BOLUS
500.0000 mL | Freq: Once | INTRAVENOUS | Status: AC
  Administered 2018-06-20: 500 mL via INTRAVENOUS

## 2018-06-20 MED ORDER — FENTANYL CITRATE (PF) 100 MCG/2ML IJ SOLN
50.0000 ug | Freq: Once | INTRAMUSCULAR | Status: DC
  Filled 2018-06-20: qty 2

## 2018-06-20 NOTE — ED Triage Notes (Addendum)
Reports L sided chest pain and sob since 1am.  States pain woke him up.  Denies nausea and vomiting.  Reports history of CHF- admitted x 2 in [place] over the past month for same.

## 2018-06-20 NOTE — ED Notes (Signed)
Patient transported to X-ray 

## 2018-06-20 NOTE — ED Notes (Signed)
ED Provider at bedside. 

## 2018-06-20 NOTE — ED Notes (Signed)
Assisted to restroom without event

## 2018-06-20 NOTE — ED Provider Notes (Signed)
[facility] EMERGENCY DEPARTMENT Provider Note   CSN: [id number] Arrival date & time: 06/20/18  0448     History   Chief Complaint Chief Complaint  Patient presents with  . Chest Pain    HPI Theodore Welch is a 57 y.o. male.  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  Patient states that he awoke from sleep at approximately midnight with complaint of left-sided chest pain was sharp in nature.  Currently is at a 6 out of 10.  Pain did not radiate.  Patient had associated shortness of breath and trouble breathing.  States that he took his diuretic pill prior to this and has been compliant.  States that his cardiologist is at [facility] and he plans on going back there for further treatment as he may be a candidate for heart transplant.  The onset of this condition was acute. The course is constant. Aggravating factors: none. Alleviating factors: none.       Past Medical History:  Diagnosis Date  . Cardiomyopathy, unspecified (HCC)   . CHF (congestive heart failure) (HCC)   . Chronic back pain   . Enlarged heart   . Gastric ulcer   . Gastroenteritis   . H/O degenerative disc disease     Patient Active Problem List   Diagnosis Date Noted  . Lumbar post-laminectomy syndrome 01/27/2017  . Spinal stenosis in cervical region 01/27/2017  . Fibromyalgia syndrome 01/27/2017  . Myofascial pain syndrome, cervical 01/27/2017    History reviewed. No pertinent surgical history.      Home Medications    Prior to Admission medications   Medication Sig Start Date End Date Taking? Authorizing Provider  amLODipine (NORVASC) 10 MG tablet Take 10 mg by mouth daily.    [provider]  aspirin EC 81 MG tablet Take 81 mg by mouth daily.    [provider]    atorvastatin (LIPITOR) 10 MG tablet Take 10 mg by mouth daily.    [provider]  carbamide peroxide (DEBROX) 6.5 % otic solution Place 5 drops into both ears 3 (three) times daily. 5-10 drops    [provider]  cetirizine (ZYRTEC) 10 MG tablet Take 10 mg by mouth at bedtime.    [provider]  clotrimazole (LOTRIMIN) 1 % external solution Apply 1 application topically daily.    [provider]  cyanocobalamin 500 MCG tablet Take 500 mcg by mouth daily.    [provider]  DULoxetine (CYMBALTA) 60 MG capsule Take 60 mg by mouth daily.    [provider]  fluticasone (FLONASE) 50 MCG/ACT nasal spray Place 1 spray into both nostrils 2 (two) times daily.    [provider]  gabapentin (NEURONTIN) 300 MG capsule Take 600 mg by mouth 3 (three) times daily.    [provider]  hydrochlorothiazide (HYDRODIURIL) 25 MG tablet Take 25 mg by mouth daily.    [provider]  hydrOXYzine (ATARAX/VISTARIL) 10 MG tablet Take 10 mg by mouth at bedtime as needed. One to two tablets    [provider]  Ipratropium-Albuterol (COMBIVENT) 20-100 MCG/ACT AERS respimat Inhale 1 puff into the lungs every 6 (six) hours.    [provider]  lidocaine (LIDODERM) 5 % Place 1 patch onto the skin daily. Remove & Discard patch within 12 hours or as directed  by MD    [provider]  lidocaine (LMX) 4 % cream Apply 1 application topically 2 (two) times daily as needed.    [provider]  meloxicam (MOBIC) 7.5 MG tablet Take 7.5 mg by mouth 2 (two) times daily.    [provider]  metFORMIN (GLUCOPHAGE-XR) 500 MG 24 hr tablet Take 500 mg by mouth 2 (two) times daily.    [provider]  methocarbamol (ROBAXIN) 750 MG tablet Take 750 mg by mouth 3 (three) times daily as needed. 02/21/14   [provider]  omeprazole (PRILOSEC) 20 MG capsule Take 20 mg by mouth daily.    [provider]  sildenafil (VIAGRA) 100 MG tablet Take 50 mg by mouth daily as needed for erectile dysfunction.    [provider]  thiamine 100 MG tablet Take 100 mg by mouth daily.    [provider]    Family History No family history on file.  Social History Social History   Tobacco Use  . Smoking status: Current Every Day Smoker    Types: Cigarettes    Start date: 2003  . Smokeless tobacco: Never Used  Substance Use Topics  . Alcohol use: Not Currently  . Drug use: Not Currently     Allergies   Patient has no known allergies.   Review of Systems Review of Systems  Constitutional: Negative for diaphoresis and fever.  Eyes: Negative for redness.  Respiratory: Positive for shortness of breath. Negative for cough.   Cardiovascular: Positive for chest pain. Negative for palpitations and leg swelling.  Gastrointestinal: Negative for abdominal pain, nausea and vomiting.  Genitourinary: Negative for dysuria.  Musculoskeletal: Negative for back pain and neck pain.  Skin: Negative for rash.  Neurological: Positive for dizziness. Negative for syncope and light-headedness.  Psychiatric/Behavioral: The patient is not nervous/anxious.      Physical Exam Updated Vital Signs BP (!) 89/79   Pulse 95   Resp 12   Ht 5\' 10"  (1.778 m)   Wt 65.3 kg (144 lb)   SpO2 100%   BMI 20.66 kg/m   Physical Exam  Constitutional: He appears well-developed and well-nourished.  HENT:  Head: Normocephalic and atraumatic.  Mouth/Throat: Mucous membranes are normal. Mucous membranes are not dry.  Eyes: Conjunctivae are normal.  Neck: Trachea normal and normal range of motion. Neck supple. Normal carotid pulses and no JVD present. No muscular tenderness present. Carotid bruit is not present. No tracheal deviation present.  Cardiovascular: Normal rate, regular rhythm, S1 normal, S2 normal, normal heart sounds and intact distal pulses. Exam reveals no distant heart sounds and  no decreased pulses.  No murmur heard. Pulmonary/Chest: Effort normal and breath sounds normal. No respiratory distress. He has no wheezes. He exhibits no tenderness.  Abdominal: Soft. Normal aorta and bowel sounds are normal. There is no tenderness. There is no rebound and no guarding.  Musculoskeletal: He exhibits no edema.  Neurological: He is alert.  Skin: Skin is warm and dry. He is not diaphoretic. No cyanosis. No pallor.  Psychiatric: He has a normal mood and affect.  Nursing note and vitals reviewed.    ED Treatments / Results  Labs (all labs ordered are listed, but only abnormal results are displayed) Labs Reviewed  BASIC METABOLIC PANEL - Abnormal; Notable for the following components:      Result Value   Chloride 97 (*)    Glucose, Bld 109 (*)    Anion gap 17 (*)    All  other components within normal limits  CBC - Abnormal; Notable for the following components:   Platelets 141 (*)    All other components within normal limits  I-STAT TROPONIN, ED  I-STAT TROPONIN, ED    EKG EKG Interpretation  Date/Time:  Saturday June 20 2018 04:52:55 EDT Ventricular Rate:  86 PR Interval:  174 QRS Duration: 82 QT Interval:  460 QTC Calculation: 550 R Axis:   -33 Text Interpretation:  Normal sinus rhythm Possible Left atrial enlargement Left axis deviation Inferior infarct , age undetermined Anterior infarct , age undetermined Abnormal ECG QT prolonged No old tracing to compare Confirmed by Raeford Razor [id number]) on 06/20/2018 6:57:36 AM   Radiology Dg Chest 2 View  Result Date: 06/20/2018 CLINICAL DATA:  Awoke with chest pain. EXAM: CHEST - 2 VIEW COMPARISON:  None. FINDINGS: The heart is enlarged. Mediastinal contours are normal. No pulmonary edema. No focal airspace disease, pleural effusion or pneumothorax. Widening of the right acromioclavicular joint. IMPRESSION: Cardiomegaly without congestive failure or acute pulmonary process. Electronically Signed   By: Rubye Oaks M.D.   On: 06/20/2018 06:15    Procedures Procedures (including critical care time)  Medications Ordered in ED Medications  fentaNYL (SUBLIMAZE) injection 50 mcg (has no administration in time range)  sodium chloride 0.9 % bolus 500 mL (has no administration in time range)     Initial Impression / Assessment and Plan / ED Course  I have reviewed the triage vital signs and the nursing notes.  Pertinent labs & imaging results that were available during my care of the patient were reviewed by me and considered in my medical decision making (see chart for details).     Patient seen and examined. Work-up initiated. Initial labs are reassuring. Medications ordered.   Vital signs reviewed and are as follows: BP (!) 88/79   Pulse 95   Resp 12   Ht 5\' 10"  (1.778 m)   Wt 65.3 kg (144 lb)   SpO2 100%   BMI 20.66 kg/m   ED ECG REPORT   Date: 06/20/2018  Rate: 86  Rhythm: normal sinus rhythm  QRS Axis: left  Intervals: QT prolonged  ST/T Wave abnormalities: normal  Conduction Disutrbances:none  Narrative Interpretation:   Old EKG Reviewed: none available  I have personally reviewed the EKG tracing and agree with the computerized printout as noted.  9:35 AM Repeat EKG/trop pending.   Records requested.  On first attempt, I received back results of 1 CMP only.  Secretary to try again.  12:03 PM Received 60 pages covering patient's admissions in June and July 2019.  Patient had left heart cath in 04/2018 demonstrating several areas of no more than 25% blockage.  He has EF in the 20 to 25% range.  He was discharged home with LifeVest.  Patient seen earlier by Dr. Juleen China.   Patient is currently asymptomatic.  He is feeling much better and ready for discharged home.  We discussed that he needs to call his cardiologist to let them know he was seen in the emergency department.  He is to follow-up with his cardiologist in 10 days and with [facility] in [place] later in the month for  consideration of heart transplant.  We discussed signs and symptoms that should cause his return to the hospital including recurrent or worsening chest pain, shortness of breath, or if he feels dizzy or lightheaded, or passes out.  Patient states that he is very willing to return if these symptoms occur.  He  is to continue his fluid restriction at home as prescribed by his doctor.  He will monitor his weights closely.  Questions from family answered.  Ready for discharge.  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  Chest pain today evaluated with chest x-ray, troponin x2, EKG x2.  EKGs are unchanged from previous compared to those during recent hospitalization.  Troponin negative.  Chest x-ray does not show any signs of overt failure.  Patient without tachycardia or hypoxia.  Patient's blood pressures have been low however he seems to be asymptomatic from the standpoint.  He is not lightheaded and has not passed out.  He states that he typically will stand up very slowly but his symptoms have not worsened recently.  Patient was discharged last month of the blood pressure of about 100 systolic.  He has been in the 80s to 90s here without signs of sepsis, cardiogenic shock.  No signs of AKI.  Feel patient is well compensated and has an appropriate therapy in place. No indications for admission or observation today. Pt/family seem motivated to return with worsening or changing symptoms.   ED Discharge Orders    None       Renne Crigler, Cordelia Poche 06/20/18 1212    Raeford Razor, MD 06/21/18 [phone]

## 2018-06-20 NOTE — Discharge Instructions (Signed)
Please read and follow all provided instructions.  Your diagnoses today include:  1. Chest pain, unspecified type    Tests performed today include:  An EKG of your heart  A chest x-ray  Cardiac enzymes - a blood test for heart muscle damage  Blood counts and electrolytes  Vital signs. See below for your results today.   Medications prescribed:   None  Take any prescribed medications only as directed.  Follow-up instructions: Please follow-up with your primary care provider and cardiologist.   Return instructions:  SEEK IMMEDIATE MEDICAL ATTENTION IF:  You have severe chest pain, especially if the pain is crushing or pressure-like and spreads to the arms, back, neck, or jaw, or if you have sweating, nausea (feeling sick to your stomach), or shortness of breath. THIS IS AN EMERGENCY. Don't wait to see if the pain will go away. Get medical help at once. Call 911 or 0 (operator). DO NOT drive yourself to the hospital.   Your chest pain gets worse and does not go away with rest.   You have an attack of chest pain lasting longer than usual, despite rest and treatment with the medications your caregiver has prescribed.   You wake from sleep with chest pain or shortness of breath.  You feel dizzy or faint.  You have chest pain not typical of your usual pain for which you originally saw your caregiver.   You have any other emergent concerns regarding your health.  Additional Information: Chest pain comes from many different causes. Your caregiver has diagnosed you as having chest pain that is not specific for one problem, but does not require admission.  You are at low risk for an acute heart condition or other serious illness.   Your vital signs today were: BP (!) 85/68 (BP Location: Right Arm)    Pulse 94    Temp 98.5 F (36.9 C) (Oral)    Resp 16    Ht 5\' 10"  (1.778 m)    Wt 65.3 kg (144 lb)    SpO2 100%    BMI 20.66 kg/m  If your blood pressure (BP) was elevated above  135/85 this visit, please have this repeated by your doctor within one month. --------------

## 2018-06-20 NOTE — ED Notes (Signed)
Report taken from [place], RN - care assumed at this time; resting quietly on stretcher with wife at bedside - states feels "so-so"; ccm showing SR rate 94 with occasional unifocal PVCs; reports "a little bit of dyspnea and left-sided chest pain; skin dark, warm, dry; resp even, nonlabored at 20 breaths per minute; able to speak in complete sentences; o2 sat 99% RA

## 2018-07-21 ENCOUNTER — Emergency Department (HOSPITAL_COMMUNITY): Payer: Medicare Other

## 2018-07-21 ENCOUNTER — Encounter (HOSPITAL_COMMUNITY): Payer: Self-pay | Admitting: Emergency Medicine

## 2018-07-21 ENCOUNTER — Other Ambulatory Visit: Payer: Self-pay

## 2018-07-21 ENCOUNTER — Inpatient Hospital Stay (HOSPITAL_COMMUNITY): Payer: Medicare Other

## 2018-07-21 ENCOUNTER — Inpatient Hospital Stay (HOSPITAL_COMMUNITY)
Admission: EM | Admit: 2018-07-21 | Discharge: 2018-08-12 | DRG: 001 | Disposition: A | Discharge status: Home / Self Care | Payer: Medicare Other | Attending: Surgery | Admitting: Surgery

## 2018-07-21 ENCOUNTER — Inpatient Hospital Stay: Payer: Self-pay

## 2018-07-21 DIAGNOSIS — I10 Essential (primary) hypertension: Secondary | ICD-10-CM | POA: Insufficient documentation

## 2018-07-21 DIAGNOSIS — J449 Chronic obstructive pulmonary disease, unspecified: Secondary | ICD-10-CM | POA: Diagnosis present

## 2018-07-21 DIAGNOSIS — Z7984 Long term (current) use of oral hypoglycemic drugs: Secondary | ICD-10-CM

## 2018-07-21 DIAGNOSIS — G9341 Metabolic encephalopathy: Secondary | ICD-10-CM | POA: Diagnosis present

## 2018-07-21 DIAGNOSIS — I5022 Chronic systolic (congestive) heart failure: Secondary | ICD-10-CM | POA: Diagnosis not present

## 2018-07-21 DIAGNOSIS — K7460 Unspecified cirrhosis of liver: Secondary | ICD-10-CM | POA: Diagnosis not present

## 2018-07-21 DIAGNOSIS — Z95811 Presence of heart assist device: Secondary | ICD-10-CM | POA: Diagnosis not present

## 2018-07-21 DIAGNOSIS — Z87891 Personal history of nicotine dependence: Secondary | ICD-10-CM | POA: Diagnosis not present

## 2018-07-21 DIAGNOSIS — G4733 Obstructive sleep apnea (adult) (pediatric): Secondary | ICD-10-CM | POA: Diagnosis present

## 2018-07-21 DIAGNOSIS — I420 Dilated cardiomyopathy: Secondary | ICD-10-CM | POA: Diagnosis present

## 2018-07-21 DIAGNOSIS — R188 Other ascites: Secondary | ICD-10-CM

## 2018-07-21 DIAGNOSIS — Z7189 Other specified counseling: Secondary | ICD-10-CM | POA: Diagnosis not present

## 2018-07-21 DIAGNOSIS — I82409 Acute embolism and thrombosis of unspecified deep veins of unspecified lower extremity: Secondary | ICD-10-CM

## 2018-07-21 DIAGNOSIS — I5082 Biventricular heart failure: Secondary | ICD-10-CM | POA: Diagnosis present

## 2018-07-21 DIAGNOSIS — D696 Thrombocytopenia, unspecified: Secondary | ICD-10-CM | POA: Diagnosis present

## 2018-07-21 DIAGNOSIS — M545 Low back pain: Secondary | ICD-10-CM | POA: Diagnosis not present

## 2018-07-21 DIAGNOSIS — R570 Cardiogenic shock: Secondary | ICD-10-CM | POA: Diagnosis present

## 2018-07-21 DIAGNOSIS — F17210 Nicotine dependence, cigarettes, uncomplicated: Secondary | ICD-10-CM | POA: Diagnosis present

## 2018-07-21 DIAGNOSIS — I081 Rheumatic disorders of both mitral and tricuspid valves: Secondary | ICD-10-CM | POA: Diagnosis present

## 2018-07-21 DIAGNOSIS — M7989 Other specified soft tissue disorders: Secondary | ICD-10-CM | POA: Diagnosis not present

## 2018-07-21 DIAGNOSIS — E785 Hyperlipidemia, unspecified: Secondary | ICD-10-CM | POA: Diagnosis present

## 2018-07-21 DIAGNOSIS — D649 Anemia, unspecified: Secondary | ICD-10-CM | POA: Diagnosis present

## 2018-07-21 DIAGNOSIS — D62 Acute posthemorrhagic anemia: Secondary | ICD-10-CM | POA: Diagnosis not present

## 2018-07-21 DIAGNOSIS — I82491 Acute embolism and thrombosis of other specified deep vein of right lower extremity: Secondary | ICD-10-CM | POA: Diagnosis present

## 2018-07-21 DIAGNOSIS — I509 Heart failure, unspecified: Secondary | ICD-10-CM | POA: Diagnosis not present

## 2018-07-21 DIAGNOSIS — I729 Aneurysm of unspecified site: Secondary | ICD-10-CM | POA: Diagnosis not present

## 2018-07-21 DIAGNOSIS — Z7901 Long term (current) use of anticoagulants: Secondary | ICD-10-CM

## 2018-07-21 DIAGNOSIS — I472 Ventricular tachycardia: Secondary | ICD-10-CM | POA: Diagnosis present

## 2018-07-21 DIAGNOSIS — I5023 Acute on chronic systolic (congestive) heart failure: Secondary | ICD-10-CM | POA: Diagnosis present

## 2018-07-21 DIAGNOSIS — I97630 Postprocedural hematoma of a circulatory system organ or structure following a cardiac catheterization: Secondary | ICD-10-CM | POA: Diagnosis not present

## 2018-07-21 DIAGNOSIS — K761 Chronic passive congestion of liver: Secondary | ICD-10-CM | POA: Diagnosis present

## 2018-07-21 DIAGNOSIS — E1142 Type 2 diabetes mellitus with diabetic polyneuropathy: Secondary | ICD-10-CM | POA: Diagnosis present

## 2018-07-21 DIAGNOSIS — Z7982 Long term (current) use of aspirin: Secondary | ICD-10-CM

## 2018-07-21 DIAGNOSIS — E119 Type 2 diabetes mellitus without complications: Secondary | ICD-10-CM

## 2018-07-21 DIAGNOSIS — F419 Anxiety disorder, unspecified: Secondary | ICD-10-CM | POA: Diagnosis present

## 2018-07-21 DIAGNOSIS — Z79899 Other long term (current) drug therapy: Secondary | ICD-10-CM | POA: Diagnosis not present

## 2018-07-21 DIAGNOSIS — E876 Hypokalemia: Secondary | ICD-10-CM | POA: Diagnosis present

## 2018-07-21 DIAGNOSIS — R0602 Shortness of breath: Secondary | ICD-10-CM

## 2018-07-21 DIAGNOSIS — Z720 Tobacco use: Secondary | ICD-10-CM | POA: Diagnosis present

## 2018-07-21 DIAGNOSIS — I361 Nonrheumatic tricuspid (valve) insufficiency: Secondary | ICD-10-CM | POA: Diagnosis not present

## 2018-07-21 DIAGNOSIS — F1290 Cannabis use, unspecified, uncomplicated: Secondary | ICD-10-CM | POA: Diagnosis present

## 2018-07-21 DIAGNOSIS — I340 Nonrheumatic mitral (valve) insufficiency: Secondary | ICD-10-CM | POA: Diagnosis not present

## 2018-07-21 DIAGNOSIS — I5043 Acute on chronic combined systolic (congestive) and diastolic (congestive) heart failure: Secondary | ICD-10-CM | POA: Diagnosis not present

## 2018-07-21 DIAGNOSIS — I429 Cardiomyopathy, unspecified: Secondary | ICD-10-CM | POA: Diagnosis not present

## 2018-07-21 DIAGNOSIS — K7469 Other cirrhosis of liver: Secondary | ICD-10-CM | POA: Diagnosis not present

## 2018-07-21 DIAGNOSIS — G8929 Other chronic pain: Secondary | ICD-10-CM | POA: Diagnosis present

## 2018-07-21 DIAGNOSIS — Z515 Encounter for palliative care: Secondary | ICD-10-CM | POA: Diagnosis present

## 2018-07-21 DIAGNOSIS — E440 Moderate protein-calorie malnutrition: Secondary | ICD-10-CM | POA: Diagnosis present

## 2018-07-21 DIAGNOSIS — J984 Other disorders of lung: Secondary | ICD-10-CM | POA: Diagnosis present

## 2018-07-21 DIAGNOSIS — I110 Hypertensive heart disease with heart failure: Principal | ICD-10-CM | POA: Diagnosis present

## 2018-07-21 DIAGNOSIS — G629 Polyneuropathy, unspecified: Secondary | ICD-10-CM | POA: Diagnosis present

## 2018-07-21 DIAGNOSIS — I97638 Postprocedural hematoma of a circulatory system organ or structure following other circulatory system procedure: Secondary | ICD-10-CM | POA: Diagnosis not present

## 2018-07-21 DIAGNOSIS — I82401 Acute embolism and thrombosis of unspecified deep veins of right lower extremity: Secondary | ICD-10-CM | POA: Diagnosis not present

## 2018-07-21 DIAGNOSIS — I43 Cardiomyopathy in diseases classified elsewhere: Secondary | ICD-10-CM | POA: Diagnosis not present

## 2018-07-21 DIAGNOSIS — Z681 Body mass index (BMI) 19 or less, adult: Secondary | ICD-10-CM

## 2018-07-21 DIAGNOSIS — Z8711 Personal history of peptic ulcer disease: Secondary | ICD-10-CM

## 2018-07-21 DIAGNOSIS — K7031 Alcoholic cirrhosis of liver with ascites: Secondary | ICD-10-CM | POA: Diagnosis present

## 2018-07-21 DIAGNOSIS — Z01818 Encounter for other preprocedural examination: Secondary | ICD-10-CM

## 2018-07-21 LAB — TROPONIN I
Troponin I: <0.03 ng/mL (ref <0.03)
Troponin I: <0.03 ng/mL (ref <0.03)
Troponin I: <0.03 ng/mL (ref <0.03)

## 2018-07-21 LAB — ECHOCARDIOGRAM COMPLETE
HEIGHTINCHES: 62 in
WEIGHTICAEL: 2384 [oz_av]

## 2018-07-21 LAB — BLOOD GAS, ARTERIAL
ACID-BASE EXCESS: 1.5 mmol/L (ref 0.0–2.0)
BICARBONATE: 24.2 mmol/L (ref 20.0–28.0)
Drawn by: 129711
O2 CONTENT: 2 L/min
O2 SAT: 97.5 %
PCO2 ART: 29.5 mmHg — AB (ref 32.0–48.0)
PH ART: 7.525 — AB (ref 7.350–7.450)
PO2 ART: 89.2 mmHg (ref 83.0–108.0)
Patient temperature: 98.6

## 2018-07-21 LAB — BASIC METABOLIC PANEL
Anion gap: 14 (ref 5–15)
BUN: 22 mg/dL — AB (ref 6–20)
CHLORIDE: 99 mmol/L (ref 98–111)
CO2: 24 mmol/L (ref 22–32)
Calcium: 9.6 mg/dL (ref 8.9–10.3)
Creatinine, Ser: 1.15 mg/dL (ref 0.61–1.24)
GFR calc Af Amer: >60 mL/min (ref >60)
GFR calc non Af Amer: >60 mL/min (ref >60)
Glucose, Bld: 93 mg/dL (ref 70–99)
POTASSIUM: 3.6 mmol/L (ref 3.5–5.1)
SODIUM: 137 mmol/L (ref 135–145)

## 2018-07-21 LAB — LIPID PANEL
CHOL/HDL RATIO: 3 ratio
CHOLESTEROL: 97 mg/dL (ref 0–200)
HDL: 32 mg/dL — ABNORMAL LOW (ref >40)
LDL Cholesterol: 57 mg/dL (ref 0–99)
Triglycerides: 39 mg/dL (ref <150)
VLDL: 8 mg/dL (ref 0–40)

## 2018-07-21 LAB — HEPATIC FUNCTION PANEL
ALK PHOS: 126 U/L (ref 38–126)
ALT: 15 U/L (ref 0–44)
AST: 23 U/L (ref 15–41)
Albumin: 3.9 g/dL (ref 3.5–5.0)
BILIRUBIN DIRECT: 1.1 mg/dL — AB (ref 0.0–0.2)
Indirect Bilirubin: 2.3 mg/dL — ABNORMAL HIGH (ref 0.3–0.9)
Total Bilirubin: 3.4 mg/dL — ABNORMAL HIGH (ref 0.3–1.2)
Total Protein: 7.5 g/dL (ref 6.5–8.1)

## 2018-07-21 LAB — CBC
HCT: 43.2 % (ref 39.0–52.0)
Hemoglobin: 14.1 g/dL (ref 13.0–17.0)
MCH: 28.3 pg (ref 26.0–34.0)
MCHC: 32.6 g/dL (ref 30.0–36.0)
MCV: 86.7 fL (ref 78.0–100.0)
Platelets: 160 10*3/uL (ref 150–400)
RBC: 4.98 MIL/uL (ref 4.22–5.81)
RDW: 15.7 % — AB (ref 11.5–15.5)
WBC: 7.8 10*3/uL (ref 4.0–10.5)

## 2018-07-21 LAB — CBC WITH DIFFERENTIAL/PLATELET
ABS IMMATURE GRANULOCYTES: 0.1 10*3/uL (ref 0.0–0.1)
BASOS ABS: 0.1 10*3/uL (ref 0.0–0.1)
Basophils Relative: 1 %
Eosinophils Absolute: 0.1 10*3/uL (ref 0.0–0.7)
Eosinophils Relative: 1 %
HEMATOCRIT: 40.4 % (ref 39.0–52.0)
HEMOGLOBIN: 13.1 g/dL (ref 13.0–17.0)
Immature Granulocytes: 1 %
LYMPHS ABS: 1.8 10*3/uL (ref 0.7–4.0)
LYMPHS PCT: 20 %
MCH: 28.5 pg (ref 26.0–34.0)
MCHC: 32.4 g/dL (ref 30.0–36.0)
MCV: 87.8 fL (ref 78.0–100.0)
MONO ABS: 1.2 10*3/uL — AB (ref 0.1–1.0)
Monocytes Relative: 13 %
NEUTROS ABS: 5.8 10*3/uL (ref 1.7–7.7)
Neutrophils Relative %: 64 %
Platelets: 148 10*3/uL — ABNORMAL LOW (ref 150–400)
RBC: 4.6 MIL/uL (ref 4.22–5.81)
RDW: 15.7 % — ABNORMAL HIGH (ref 11.5–15.5)
WBC: 9 10*3/uL (ref 4.0–10.5)

## 2018-07-21 LAB — GLUCOSE, CAPILLARY: GLUCOSE-CAPILLARY: 106 mg/dL — AB (ref 70–99)

## 2018-07-21 LAB — HEMOGLOBIN A1C
Hgb A1c MFr Bld: 6.5 % — ABNORMAL HIGH (ref 4.8–5.6)
Mean Plasma Glucose: 139.85 mg/dL

## 2018-07-21 LAB — BRAIN NATRIURETIC PEPTIDE: B Natriuretic Peptide: 1343.9 pg/mL — ABNORMAL HIGH (ref 0.0–100.0)

## 2018-07-21 LAB — CREATININE, SERUM: CREATININE: 1.1 mg/dL (ref 0.61–1.24)

## 2018-07-21 LAB — COOXEMETRY PANEL
Carboxyhemoglobin: 1.7 % — ABNORMAL HIGH (ref 0.5–1.5)
Methemoglobin: 1.6 % — ABNORMAL HIGH (ref 0.0–1.5)
O2 SAT: 43.2 %
Total hemoglobin: 13 g/dL (ref 12.0–16.0)

## 2018-07-21 LAB — I-STAT TROPONIN, ED: TROPONIN I, POC: 0.05 ng/mL (ref 0.00–0.08)

## 2018-07-21 LAB — AMMONIA: AMMONIA: 64 umol/L — AB (ref 9–35)

## 2018-07-21 MED ORDER — METHOCARBAMOL 500 MG PO TABS
750.0000 mg | ORAL_TABLET | Freq: Three times a day (TID) | ORAL | Status: DC | PRN
  Administered 2018-07-31 – 2018-08-02 (×4): 750 mg via ORAL
  Filled 2018-07-21 (×4): qty 2

## 2018-07-21 MED ORDER — MILRINONE LACTATE IN DEXTROSE 20-5 MG/100ML-% IV SOLN
0.3750 ug/kg/min | INTRAVENOUS | Status: DC
  Administered 2018-07-21: 0.25 ug/kg/min via INTRAVENOUS
  Administered 2018-07-22 – 2018-08-03 (×19): 0.375 ug/kg/min via INTRAVENOUS
  Filled 2018-07-21 (×23): qty 100

## 2018-07-21 MED ORDER — SODIUM CHLORIDE 0.9% FLUSH
10.0000 mL | Freq: Two times a day (BID) | INTRAVENOUS | Status: DC
  Administered 2018-07-21 – 2018-08-02 (×19): 10 mL

## 2018-07-21 MED ORDER — SODIUM CHLORIDE 0.9% FLUSH
10.0000 mL | INTRAVENOUS | Status: DC | PRN
  Administered 2018-07-31: 10 mL
  Filled 2018-07-21: qty 40

## 2018-07-21 MED ORDER — IOPAMIDOL (ISOVUE-370) INJECTION 76%
100.0000 mL | Freq: Once | INTRAVENOUS | Status: AC | PRN
  Administered 2018-07-21: 100 mL via INTRAVENOUS

## 2018-07-21 MED ORDER — IPRATROPIUM-ALBUTEROL 0.5-2.5 (3) MG/3ML IN SOLN: 3.0000 mL | Freq: Four times a day (QID) | RESPIRATORY_TRACT | Status: DC | PRN

## 2018-07-21 MED ORDER — IOPAMIDOL (ISOVUE-370) INJECTION 76%
INTRAVENOUS | Status: AC
  Filled 2018-07-21: qty 100

## 2018-07-21 MED ORDER — IPRATROPIUM-ALBUTEROL 0.5-2.5 (3) MG/3ML IN SOLN
3.0000 mL | Freq: Four times a day (QID) | RESPIRATORY_TRACT | Status: DC
  Administered 2018-07-21 (×3): 3 mL via RESPIRATORY_TRACT
  Filled 2018-07-21 (×2): qty 3

## 2018-07-21 MED ORDER — NICOTINE 7 MG/24HR TD PT24
7.0000 mg | MEDICATED_PATCH | Freq: Every day | TRANSDERMAL | Status: DC | PRN
  Filled 2018-07-21: qty 1

## 2018-07-21 MED ORDER — SODIUM CHLORIDE 0.9% FLUSH
3.0000 mL | Freq: Two times a day (BID) | INTRAVENOUS | Status: DC
  Administered 2018-07-21 – 2018-08-02 (×9): 3 mL via INTRAVENOUS

## 2018-07-21 MED ORDER — HYDROXYZINE HCL 25 MG PO TABS: 25.0000 mg | ORAL_TABLET | Freq: Three times a day (TID) | ORAL | Status: DC | PRN

## 2018-07-21 MED ORDER — SENNOSIDES-DOCUSATE SODIUM 8.6-50 MG PO TABS: 1.0000 | ORAL_TABLET | Freq: Every evening | ORAL | Status: DC | PRN

## 2018-07-21 MED ORDER — FUROSEMIDE 10 MG/ML IJ SOLN
80.0000 mg | Freq: Once | INTRAMUSCULAR | Status: AC
  Administered 2018-07-21: 80 mg via INTRAVENOUS
  Filled 2018-07-21: qty 8

## 2018-07-21 MED ORDER — IPRATROPIUM-ALBUTEROL 20-100 MCG/ACT IN AERS: 1.0000 | INHALATION_SPRAY | Freq: Four times a day (QID) | RESPIRATORY_TRACT | Status: DC

## 2018-07-21 MED ORDER — THIAMINE HCL 100 MG PO TABS: 100.0000 mg | ORAL_TABLET | Freq: Every day | ORAL | Status: DC

## 2018-07-21 MED ORDER — DULOXETINE HCL 60 MG PO CPEP
60.0000 mg | ORAL_CAPSULE | Freq: Every day | ORAL | Status: DC
  Administered 2018-07-21 – 2018-08-12 (×22): 60 mg via ORAL
  Filled 2018-07-21 (×23): qty 1

## 2018-07-21 MED ORDER — INSULIN ASPART 100 UNIT/ML ~~LOC~~ SOLN
0.0000 [IU] | Freq: Three times a day (TID) | SUBCUTANEOUS | Status: DC
  Administered 2018-07-24: 2 [IU] via SUBCUTANEOUS
  Administered 2018-07-27 – 2018-07-31 (×2): 1 [IU] via SUBCUTANEOUS

## 2018-07-21 MED ORDER — IPRATROPIUM-ALBUTEROL 0.5-2.5 (3) MG/3ML IN SOLN
RESPIRATORY_TRACT | Status: AC
  Administered 2018-07-21: 3 mL via RESPIRATORY_TRACT
  Filled 2018-07-21: qty 3

## 2018-07-21 MED ORDER — ASPIRIN EC 81 MG PO TBEC
81.0000 mg | DELAYED_RELEASE_TABLET | Freq: Every day | ORAL | Status: DC
  Administered 2018-07-21 – 2018-08-02 (×13): 81 mg via ORAL
  Filled 2018-07-21 (×13): qty 1

## 2018-07-21 MED ORDER — GABAPENTIN 300 MG PO CAPS
600.0000 mg | ORAL_CAPSULE | Freq: Three times a day (TID) | ORAL | Status: DC
  Administered 2018-07-21 – 2018-08-12 (×66): 600 mg via ORAL
  Filled 2018-07-21 (×67): qty 2

## 2018-07-21 MED ORDER — SPIRONOLACTONE 12.5 MG HALF TABLET
12.5000 mg | ORAL_TABLET | Freq: Every day | ORAL | Status: DC
  Administered 2018-07-21 – 2018-07-23 (×3): 12.5 mg via ORAL
  Filled 2018-07-21 (×3): qty 1

## 2018-07-21 MED ORDER — ACETAMINOPHEN 650 MG RE SUPP: 650.0000 mg | Freq: Four times a day (QID) | RECTAL | Status: DC | PRN

## 2018-07-21 MED ORDER — ATORVASTATIN CALCIUM 10 MG PO TABS
10.0000 mg | ORAL_TABLET | Freq: Every day | ORAL | Status: DC
  Administered 2018-07-21 – 2018-08-02 (×13): 10 mg via ORAL
  Filled 2018-07-21 (×13): qty 1

## 2018-07-21 MED ORDER — HYDROXYZINE PAMOATE 25 MG PO CAPS: 25.0000 mg | ORAL_CAPSULE | Freq: Three times a day (TID) | ORAL | Status: DC | PRN

## 2018-07-21 MED ORDER — ALBUTEROL SULFATE (2.5 MG/3ML) 0.083% IN NEBU
5.0000 mg | INHALATION_SOLUTION | Freq: Once | RESPIRATORY_TRACT | Status: AC
  Administered 2018-07-21: 5 mg via RESPIRATORY_TRACT
  Filled 2018-07-21: qty 6

## 2018-07-21 MED ORDER — PANTOPRAZOLE SODIUM 40 MG PO TBEC
40.0000 mg | DELAYED_RELEASE_TABLET | Freq: Every day | ORAL | Status: DC
  Administered 2018-07-21 – 2018-08-02 (×13): 40 mg via ORAL
  Filled 2018-07-21 (×13): qty 1

## 2018-07-21 MED ORDER — ACETAMINOPHEN 325 MG PO TABS
650.0000 mg | ORAL_TABLET | Freq: Four times a day (QID) | ORAL | Status: DC | PRN
  Administered 2018-07-29 – 2018-07-30 (×3): 650 mg via ORAL
  Filled 2018-07-21 (×3): qty 2

## 2018-07-21 MED ORDER — VITAMIN B-1 100 MG PO TABS
100.0000 mg | ORAL_TABLET | Freq: Every day | ORAL | Status: DC
Start: 2018-07-21 — End: 2018-08-12
  Administered 2018-07-21 – 2018-08-12 (×22): 100 mg via ORAL
  Filled 2018-07-21 (×22): qty 1

## 2018-07-21 MED ORDER — FUROSEMIDE 10 MG/ML IJ SOLN
80.0000 mg | Freq: Two times a day (BID) | INTRAMUSCULAR | Status: DC
  Administered 2018-07-21 – 2018-07-22 (×3): 80 mg via INTRAVENOUS
  Filled 2018-07-21 (×3): qty 8

## 2018-07-21 MED ORDER — ENOXAPARIN SODIUM 40 MG/0.4ML ~~LOC~~ SOLN
40.0000 mg | SUBCUTANEOUS | Status: DC
  Administered 2018-07-21: 40 mg via SUBCUTANEOUS
  Filled 2018-07-21: qty 0.4

## 2018-07-21 NOTE — Progress Notes (Signed)
Notified of coox. After discussion with Dr. Shirlee Latch, started milrinone 0.25 mcg/kg/min.

## 2018-07-21 NOTE — Progress Notes (Signed)
To the best of my knowledge, documentation by Audry Pili, nursing student at [facility] [facility] [employer] [facility], is correct.

## 2018-07-21 NOTE — ED Notes (Signed)
Pt now has 0/10 CP

## 2018-07-21 NOTE — H&P (Addendum)
Date: 07/21/2018               Patient Name:  Theodore Welch MRN: [id number]  DOB: 08/08/1961 Age / Sex: 57 y.o., male   PCP: [name], MD         Medical Service: Internal Medicine Teaching Service         Attending Physician: Dr. Inez Catalina, MD    First Contact: MS4 Steen Pager: [phone]  Second Contact: Dr. 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 He follows with [facility] since his diagnosis of NICM 04/2018 and most recently saw his HF-MD 8/22 who increased Lasix from 80 mg BID to 100 mg BID and again to 120 mg BID due to above complaint. His leg swelling improved however respiratory complaints have worsened and now reports he is unable to lay flat for any period of time due to orthopnea and PND.   He's not sure what caused his NICM or his current presentation and feels he is compliant with sodium and fluid restriction (although reports his cardiologist said ice was OK and has been having this "all day"). He denies recent illness or stressor, change in medication besides lasix increase and reports compliance with medications. Denies any fevers, chills, chest pressure, productive cough, abdominal pain, nausea, vomiting, changes in bowel movements or recent immobilization. Did report brief chest discomfort to EDP however denied any to me. Admits to asymmetric leg swelling, R>L, for several months. He weighs himself  regularly and reports his weight at home has been ~143-148 but feels his "normal" weight is ~170-180 lbs although does report this was before diuresis during his last admission.   In the ED temp 97.9*, pulse 92, BP 102/85, respirations 17 and was saturating 98% on RA. 2-view CXR with cardiomegaly BMP and CBC with differential wnl except for Plts 148,000. BNP 1300 and POC troponin 0.05. EKG without acute ischemic changes. He was given IV Lasix 80mg  x1, albuterol and IMTS was contacted for admission.   Meds: Current Meds  Medication Sig  . aspirin EC 81 MG tablet Take 81 mg by mouth daily.  Marland Kitchen atorvastatin (LIPITOR) 20 MG tablet Take 10 mg by mouth daily.   . carbamide peroxide (DEBROX) 6.5 % otic solution Place 5 drops into both ears 3 (three) times daily. 5-10 drops  . clotrimazole (LOTRIMIN) 1 % external solution Apply 1 application topically daily.  . cyanocobalamin 500 MCG tablet Take 500 mcg by mouth daily.  . DULoxetine (CYMBALTA) 60 MG capsule Take 60 mg by mouth daily.  . fluticasone (FLONASE) 50 MCG/ACT nasal spray Place 1 spray into both nostrils 2 (two) times daily as needed for allergies.   . furosemide (LASIX) 80 MG tablet Take 100 mg by mouth 2 (two) times daily.   Marland Kitchen gabapentin (NEURONTIN) 300 MG capsule Take 600 mg by mouth 3 (three) times daily.  . hydrOXYzine (VISTARIL) 25 MG capsule  Take 25 mg by mouth 3 (three) times daily as needed for anxiety.  . Ipratropium-Albuterol (COMBIVENT) 20-100 MCG/ACT AERS respimat Inhale 1 puff into the lungs every 6 (six) hours.  . lidocaine (LIDODERM) 5 % Place 1 patch onto the skin daily. Remove & Discard patch within 12 hours or as directed by MD  . metFORMIN (GLUCOPHAGE-XR) 500 MG 24 hr tablet Take 500 mg by mouth 2 (two) times daily.  . methocarbamol (ROBAXIN) 750 MG tablet Take 750 mg by mouth 3 (three) times daily as needed for muscle spasms.   Marland Kitchen omeprazole (PRILOSEC) 20 MG capsule Take 20 mg by mouth daily.  . polyethylene glycol (MIRALAX  / GLYCOLAX) packet Take 17 g by mouth daily as needed for mild constipation.  . sildenafil (VIAGRA) 100 MG tablet Take 50 mg by mouth daily as needed for erectile dysfunction.  . thiamine 100 MG tablet Take 100 mg by mouth daily.   Allergies: Allergies as of 07/21/2018  . (No Known Allergies)   Past Medical History:  Diagnosis Date  . Cardiomyopathy, unspecified (HCC)   . CHF (congestive heart failure) (HCC)   . Chronic back pain   . Enlarged heart   . Gastric ulcer   . Gastroenteritis   . H/O degenerative disc disease    Family History: Denied family history of cardiac or pulmonary disease and specifically denied history of SCD. Mother deceased from CNS aneurysm.   Social History: Current smoker, currently smoking 1/2 cigarette a few times a day. Denied any history of heavy EtOH use and last drink was 3-4 months ago. Denied any recreational drug use. Retired on disability, served in [employer].   Review of Systems: A complete ROS was negative except as per HPI.   Physical Exam: Blood pressure 112/89, pulse 97, temperature 97.9 F (36.6 C), temperature source Oral, resp. rate 20, height 5\' 2"  (1.575 m), weight 67.6 kg, SpO2 100 %. General: Very pleasant african [place] male who appears older than stated age. Fiance present at bedside. In no acute distress. Resting comfortably with HOB elevated ~80*.  HENT: +JVD to mandible. EOMI. No conjunctival injection, icterus or ptosis. Oropharynx clear, mucous membranes moist.  Cardiovascular: Regular rate and rhythm. No murmur or rub appreciated. Pulmonary: Bibasilar rales but breathing unlabored on RA. No wheezing and speaks in complete sentences.  Abdomen: Slightly distended but non-tender throughout. Normoactive bowel sounds. No pitting edema of abdominal wall appreciated today. Extremities: Warm and perfused. 1-2+ pitting edema BL LE (R>L). Slight TTP posterior fossa R knee but no erythema.  Skin: No diaphoresis. No cyanosis.  Psych: Mood  normal and affect was mood congruent. Responds to questions appropriately.   EKG: personally reviewed my interpretation is sinus tachycardia with rate 96. Narrow complex with normal axis. Biphasic P-waves V1-V3. No new Q-waves. No new TWI or ST segment changes.   CXR: personally reviewed my interpretation is stable cardiomegaly without acute infiltrate or significant pleural effusion.   ADMISSION 04/2018, Summary from [facility] Office Note 07/09/18: Admitted to OSH 04/28/18 with acute CP and SOB. TTE with EF 20-25% and R/LHC with non-obstructive CAD however RHC with markedly elevated filling pressures and low output with RA 15, PAP 41/26/32 and PCWP 23. During admission he was unable to tolerate GDMT due to persistently-low SBP's and a LifeVest was provided due to frequent episodes of NSVT. He was re-admitted 05/18/18 at OSH for syncope and volume overload. According to the office note, inotrope's to be discussed at their next visit  Assessment & Plan by Problem: Mr. Molina is a 57 year-old male with NICM (EF 20-25%; LHC without occlusion), DM2, HTN, HLD and ongoing tobacco use who presents with peripheral edema, orthopnea and PND concerning for decompensated heart failure which did not respond to outpatient therapy. Medication optimization limited by hypotension.   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 Unfortunately, his medication optimization is limited by persistently low SBP's and he is not on ACE-I/ARB, BB/CCB or nitrate. He feels a little better since receiving IV Lasix 80 mg in ED and is  saturating well on RA but unable to lower HOB without dyspnea and PND. He denied recent illness, stressor or significant chest pain and reports compliance with his medications at home. He does have asymmetric leg swelling on examination, R>L and this along with tachycardia, tachypnea, chest discomfort and elevated filling pressures confers an intermediate risk for PE via Wells Criteria.  -No current indication for inotrope's -CTA PE, Doppler RLE -Repeat ECHO to evaluate for valvular disease which could be contributing and also r/o pericardial effusion although very-low suspicion.  -Admit to telemetry -IV Lasix 80mg  BID - may benefit from Torsemide per outpatient HF MD -Monitor daily weights and strict I&os -Follow renal function closely -Trend troponins -Repeat EKG in AM -Life Vest in place -ASA, statin continued -UDS  Type 2 Diabetes Well-controlled. A1c today 6.5%. Takes Metformin 500 mg BID at home. Will hold during admission and cover with sensitive sliding-scale insulin.  -HH/Carb mod diet -Sensitive SSI  Hyperlipidemia Continue home Atorvastatin 10mg . Have ordered lipid panel for RF modification, increase statin intensity if needed.   Thrombocytopenia History of EtOH Use Plts 148,000 on admission, was 141,000 1 month ago. Denies any history of heavy EtOH use however do suspect he was being conservative with his history. Last drink >3 months ago, will not order CIWA at this time.  -Hepatic function panel added-on to AM labs, pending  COPD Tobacco Use PFTs apparently completed at OSH, not available for viewing in our system currently.  -Nicotine patch PRN -Tobacco cessation provided  Anxiety On Cymbalta 60mg  (for both anxiety and chronic back pain) and Hydroxyzine 25mg  TID prn at home. These will be continued during admission.   Diet: HH/Carb mod DVT PPx: Lovenox IVF: None Code: FULL  Dispo: Admit patient to Inpatient with expected length of stay greater than 2  midnights.  SignedNoemi Chapel, DO 07/21/2018, 7:39 AM  Pager: [phone]

## 2018-07-21 NOTE — Consult Note (Addendum)
Advanced Heart Failure Team Consult Note   Primary Physician: [name], MD PCP-Cardiologist:  No primary care provider on file.  Reason for Consultation: severe biventricular CHF  HPI:    Theodore Welch is seen today for evaluation of severe biventricular CHF at the request of Dr. Tawana Scale is a 57 y.o. 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  R/LHC 06/08/18 at [facility] showed no obstruction, but markedly elevated filling pressures and low cardiac output. (Dr. Nils Flack). Has been seen by Dr. Valorie Roosevelt at [facility]. Pt has lifevest in place due to frequent NSVT.   Pt presented to [facility] today with 2-3 weeks worsening of LE edema, orthopnea, and PND with acute worsening over the past 2-3 days. Most recently seen at [facility] 07/09/18 and lasix increased from 80 mg BID to 100, then 120 mg BID. He states his edema initially improved however his breathing continue to worsen.  Weights at home range 143 -148. Pertinent labs on admission include K 3.6, Cr 1.10, BNP 1343, Troponin negative, WBC 7.8, Hgb 14.1, Hgb A1C 6.5. Taken for Echo as below which showed marked LV dysfunction, so CHF team consulted.   Pt feeling somewhat better with IV lasix. Pt denies any fever, chills, chest pressure, cough, abd pain, nausea, or vomiting. He has been feeling worse the past 2-3 days. He initially felt better with increased lasix as above, but again worsened. He is SOB with minimal activity and very orthopnea. He also has + daytime fatigue and sleepiness. He has not had any alcohol or cigarettes since his HF diagnosis in June of this year. He had no history of "heart problems" prior to that.  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 No IV drug use, and has a very difficult time with needles. He has had swelling in  his legs and abdomen, decreased appetite and fatigue. He says he thinks he may have had a cMRI in [place].   Echo 07/21/18 LVEF 10-20%,  Severe, diffuse, HK with no specific WMA, Grade 3 DD, diastolic flattening of septum, Moderate to severe MR, Massive LAE, Severe RV dilation, Massive RAE, Malcoaption of the TV leaflets due to RV dilation with severe TR, Mod to Severe PI, trivial pericardial effusion  CT angio 07/21/18 -  No evidence of PE.  - Slightly irregular hepatic margins with ? Cirrhosis and + ascites.   Review of Systems: [y] = yes, [ ]  = no   General: Weight gain [ ] ; Weight loss [ ] ; Anorexia [ ] ; Fatigue [y]; Fever [ ] ; Chills [ ] ; Weakness [y]  Cardiac: Chest pain/pressure [ ] ; Resting SOB [y]; Exertional SOB [y]; Orthopnea [y]; Pedal Edema [y]; Palpitations [ ] ; Syncope [ ] ; Presyncope [ ] ; Paroxysmal nocturnal dyspnea[y]  Pulmonary: Cough [y]; Wheezing[ ] ; Hemoptysis[ ] ; Sputum [ ] ; Snoring [ ]   GI: Vomiting[ ] ; Dysphagia[ ] ; Melena[ ] ; Hematochezia [ ] ; Heartburn[ ] ; Abdominal pain [ ] ; Constipation [ ] ; Diarrhea [ ] ; BRBPR [ ]   GU: Hematuria[ ] ; Dysuria [ ] ; Nocturia[ ]   Vascular: Pain in legs with walking [ ] ; Pain in feet with lying flat [ ] ; Non-healing sores [ ] ; Stroke [ ] ; TIA [ ] ; Slurred speech [ ] ;  Neuro: Headaches[ ] ; Vertigo[ ] ; Seizures[ ] ; Paresthesias[ ] ;Blurred vision [ ] ; Diplopia [ ] ; Vision  changes [ ]   Ortho/Skin: Arthritis [y]; Joint pain [y]; Muscle pain [ ] ; Joint swelling [ ] ; Back Pain [ ] ; Rash [ ]   Psych: Depression[ ] ; Anxiety[ ]   Heme: Bleeding problems [ ] ; Clotting disorders [ ] ; Anemia [ ]   Endocrine: Diabetes [ ] ; Thyroid dysfunction[ ]   Home Medications Prior to Admission medications   Medication Sig Start Date End Date Taking? Authorizing Provider  aspirin EC 81 MG tablet Take 81 mg by mouth daily.   Yes [provider]  atorvastatin (LIPITOR) 20 MG tablet Take 10 mg by mouth daily.    Yes [provider]  carbamide  peroxide (DEBROX) 6.5 % otic solution Place 5 drops into both ears 3 (three) times daily. 5-10 drops   Yes [provider]  clotrimazole (LOTRIMIN) 1 % external solution Apply 1 application topically daily.   Yes [provider]  cyanocobalamin 500 MCG tablet Take 500 mcg by mouth daily.   Yes [provider]  DULoxetine (CYMBALTA) 60 MG capsule Take 60 mg by mouth daily.   Yes [provider]  fluticasone (FLONASE) 50 MCG/ACT nasal spray Place 1 spray into both nostrils 2 (two) times daily as needed for allergies.    Yes [provider]  furosemide (LASIX) 80 MG tablet Take 100 mg by mouth 2 (two) times daily.  06/10/18  Yes [provider]  gabapentin (NEURONTIN) 300 MG capsule Take 600 mg by mouth 3 (three) times daily.   Yes [provider]  hydrOXYzine (VISTARIL) 25 MG capsule Take 25 mg by mouth 3 (three) times daily as needed for anxiety.   Yes [provider]  Ipratropium-Albuterol (COMBIVENT) 20-100 MCG/ACT AERS respimat Inhale 1 puff into the lungs every 6 (six) hours.   Yes [provider]  lidocaine (LIDODERM) 5 % Place 1 patch onto the skin daily. Remove & Discard patch within 12 hours or as directed by MD   Yes [provider]  metFORMIN (GLUCOPHAGE-XR) 500 MG 24 hr tablet Take 500 mg by mouth 2 (two) times daily.   Yes [provider]  methocarbamol (ROBAXIN) 750 MG tablet Take 750 mg by mouth 3 (three) times daily as needed for muscle spasms.  02/21/14  Yes [provider]  omeprazole (PRILOSEC) 20 MG capsule Take 20 mg by mouth daily.   Yes [provider]  polyethylene glycol (MIRALAX / GLYCOLAX) packet Take 17 g by mouth daily as needed for mild constipation.   Yes [provider]  sildenafil (VIAGRA) 100 MG tablet Take 50 mg by mouth daily as needed for erectile dysfunction.   Yes [provider]  thiamine 100 MG tablet Take 100 mg by mouth daily.    Yes [provider]    Past Medical History: Past Medical History:  Diagnosis Date  . Cardiomyopathy, unspecified (HCC)   . CHF (congestive heart failure) (HCC)   . Chronic back pain   . Enlarged heart   . Gastric ulcer   . Gastroenteritis   . H/O degenerative disc disease     Past Surgical History: Past Surgical History:  Procedure Laterality Date  . LUMBAR LAMINECTOMY/DECOMPRESSION MICRODISCECTOMY    . ORCHIECTOMY      Family History: No family history on file.  Social History: Social History   Socioeconomic History  . Marital status: Single    Spouse name: Not on file  . Number of children: Not on file  . Years of education: Not on file  . Highest education level:  Not on file  Occupational History  . Not on file  Social Needs  . Financial resource strain: Not on file  . Food insecurity:    Worry: Not on file    Inability: Not on file  . Transportation needs:    Medical: Not on file    Non-medical: Not on file  Tobacco Use  . Smoking status: Current Every Day Smoker    Types: Cigarettes    Start date: 2003  . Smokeless tobacco: Never Used  Substance and Sexual Activity  . Alcohol use: Not Currently  . Drug use: Not Currently  . Sexual activity: Not on file  Lifestyle  . Physical activity:    Days per week: Not on file    Minutes per session: Not on file  . Stress: Not on file  Relationships  . Social connections:    Talks on phone: Not on file    Gets together: Not on file    Attends religious service: Not on file    Active member of club or organization: Not on file    Attends meetings of clubs or organizations: Not on file    Relationship status: Not on file  Other Topics Concern  . Not on file  Social History Narrative  . Not on file    Allergies:  No Known Allergies  Objective:    Vital Signs:   Temp:  [97.9 F (36.6 C)] 97.9 F (36.6 C) (09/03 0527) Pulse Rate:  [90-107] 107 (09/03 1015) Resp:  [13-30] 26 (09/03  1045) BP: (100-118)/(78-95) 110/82 (09/03 1100) SpO2:  [98 %-100 %] 100 % (09/03 1100) Weight:  [67.6 kg-67.7 kg] 67.7 kg (09/03 1108)    Weight change: Filed Weights   07/21/18 0529 07/21/18 1108  Weight: 67.6 kg 67.7 kg    Intake/Output:   Intake/Output Summary (Last 24 hours) at 07/21/2018 1428 Last data filed at 07/21/2018 1025 Gross per 24 hour  Intake -  Output 1175 ml  Net -1175 ml      Physical Exam    General:  Chronically ill and fatigued appearing. SOB with conversation.  HEENT: + scleral icterus Neck: supple. JVP to jaw. Carotids 2+ bilat; no bruits. No lymphadenopathy or thyromegaly appreciated. Cor: PMI nondisplaced. Regular, tachy. ? S3. ? RV lift. 3/6 HSM LLSB/apex Lungs: Diminished basilar sounds.  Abdomen: + distended, nontender. No hepatosplenomegaly. No bruits or masses. Good bowel sounds. Extremities: no cyanosis, clubbing, or rash. Trace to 1+ peripheral edema R>L  Neuro: alert & oriented x 3, but falls asleep easily. Cranial nerves grossly intact. moves all 4 extremities w/o difficulty. Affect flat but appropriate.   Telemetry   NSR/Sinus Tach 90-100s, narrow QRS. Personally reviewed  EKG    NSR 96 bpm, personally reviewed  Labs   Basic Metabolic Panel: Recent Labs  Lab 07/21/18 0540 07/21/18 1012  NA 137  --   K 3.6  --   CL 99  --   CO2 24  --   GLUCOSE 93  --   BUN 22*  --   CREATININE 1.15 1.10  CALCIUM 9.6  --     Liver Function Tests: Recent Labs  Lab 07/21/18 1012  AST 23  ALT 15  ALKPHOS 126  BILITOT 3.4*  PROT 7.5  ALBUMIN 3.9   No results for input(s): LIPASE, AMYLASE in the last 168 hours. No results for input(s): AMMONIA in the last 168 hours.  CBC: Recent Labs  Lab 07/21/18 0540 07/21/18 1012  WBC 9.0 7.8  NEUTROABS 5.8  --   HGB 13.1 14.1  HCT 40.4 43.2  MCV 87.8 86.7  PLT 148* 160    Cardiac Enzymes: Recent Labs  Lab 07/21/18 1012  TROPONINI <0.03    BNP: BNP (last 3 results) Recent Labs     07/21/18 0540  BNP 1,343.9*    ProBNP (last 3 results) No results for input(s): PROBNP in the last 8760 hours.   CBG: No results for input(s): GLUCAP in the last 168 hours.  Coagulation Studies: No results for input(s): LABPROT, INR in the last 72 hours.   Imaging   Dg Chest 2 View  Result Date: 07/21/2018 CLINICAL DATA:  Chest pain, shortness of breath and edema for 2 days. EXAM: CHEST - 2 VIEW COMPARISON:  Chest radiograph June 20, 2018 FINDINGS: Stable cardiomegaly. No pleural effusion or focal consolidation. Mediastinal silhouette is normal. Pulmonary vasculature appears normal. No pneumothorax. Soft tissue planes and included osseous structures are non suspicious. IMPRESSION: Stable cardiomegaly.  No acute pulmonary process. Electronically Signed   By: Awilda Metro M.D.   On: 07/21/2018 06:09   Ct Angio Chest Pe W Or Wo Contrast  Result Date: 07/21/2018 CLINICAL DATA:  Shortness of breath, diabetes mellitus, hypertension, occasional smoker EXAM: CT ANGIOGRAPHY CHEST WITH CONTRAST TECHNIQUE: Multidetector CT imaging of the chest was performed using the standard protocol during bolus administration of intravenous contrast. Multiplanar CT image reconstructions and MIPs were obtained to evaluate the vascular anatomy. CONTRAST:  ISOVUE-370 IOPAMIDOL (ISOVUE-370) INJECTION 76% IV COMPARISON:  None FINDINGS: Cardiovascular: Mild atherosclerotic calcification aorta. Aorta normal caliber. Pulmonary arteries wel opacified and patent. No evidence of pulmonary embolism. Dilatation of cardiac chambers. Minimal pericardial fluid or thickening. Significant reflux of contrast into IVC and hepatic veins. Mediastinum/Nodes: Esophagus unremarkable. Enlarged RIGHT paratracheal lymph node 11 mm short axis image 39. Few additional scattered normal size mediastinal lymph nodes. Base of cervical region unremarkable. Lungs/Pleura: Minimal dependent atelectasis in the posterior lungs. Lungs  otherwise clear. No pulmonary infiltrate, pleural effusion or pneumothorax. Upper Abdomen: Significant ascites in upper abdomen. Spleen normal size. Hepatic margins are question slightly irregular cannot exclude cirrhosis. Musculoskeletal: No acute osseous findings. Review of the MIP images confirms the above findings. IMPRESSION: No evidence of pulmonary embolism. No significant intrathoracic abnormalities. Upper abdominal ascites with question slightly nodular hepatic margins cannot exclude cirrhosis. Aortic Atherosclerosis (ICD10-I70.0). Electronically Signed   By: Ulyses Southward M.D.   On: 07/21/2018 13:24      Medications:     Current Medications: . aspirin EC  81 mg Oral Daily  . atorvastatin  10 mg Oral Daily  . DULoxetine  60 mg Oral Daily  . enoxaparin (LOVENOX) injection  40 mg Subcutaneous Q24H  . furosemide  80 mg Intravenous BID  . gabapentin  600 mg Oral TID  . insulin aspart  0-9 Units Subcutaneous TID WC  . iopamidol      . ipratropium-albuterol  3 mL Nebulization Q6H  . ipratropium-albuterol      . pantoprazole  40 mg Oral Daily  . sodium chloride flush  3 mL Intravenous Q12H  . thiamine  100 mg Oral Daily    Infusions:     Patient Profile   Theodore Welch is a 57 y.o. 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  Admitted with worsening SOB and orthopnea in setting of severe biventricular CHF.   Assessment/Plan   1. Severe, biventricular CHF -> NICM - Echo  07/21/18 LVEF 10-20%,  Severe, diffuse, HK with no specific WMA, Grade 3 DD, diastolic flattening of septum, Moderate to severe MR, Massive LAE, Severe RV dilation, Massive RAE, Malcoaption of the TV leaflets due to RV dilation with severe TR, Mod to Severe PI, trivial pericardial effusion - Live vest off for no. Will need to continue on d/c. - Medication optimization has been limited by low SBPs. He is not on ACE/ARB, BB, or nitrate.  - NYHA  IIIb - IV - Volume status overloaded on exam, but appears to be R>L CHF.   - Continue lasix 80 mg IV BID for now with decent response thus far. Should consider torsemide on d/c.  - Avoid BB for now with concerns for low output.  - Consider low dose ARB once diuresed/BP more stable.  - Will start spiro 12.5 mg daily - Consider digoxin.  - Check Ammonia.  - CTA PE negative. Doppler RLE pending - UDS pending. - Discussed with [facility]. Seen by Dr. Valorie Roosevelt there. Has not had a cMRI. They had lifevest on and were planning med optimization prior to planning anything per patient.  - Will need cMRI once can tolerate without orthopnea.  - Will order limited RUQ [name] to look for cirrhosis with ascites.  2. COPD - Stable. No wheeze.  3. DM2 - Per primary. Hgb A1c 6.5% 07/21/18, so well controlled.  4. History of EtOH Use - No ETOH > 3 months ago.  5. Tobacco Use - No PFTs available in system. ( complete at OSH)  - Encouraged cessation.  - Has Nicotine patch PRN 6. Anxiety - Per primary. Continuing home meds.  7. OSA on CPAP - Reports compliance.  Medication concerns reviewed with patient and pharmacy team. Barriers identified: None at this time.   Length of Stay: 0  Luane School  07/21/2018, 2:28 PM  Advanced Heart Failure Team Pager [phone] (M-F; 7a - 4p)  Please contact [facility] for night-coverage after hours (4p -7a ) and weekends on [email or web address]  Patient seen with PA, agree with the above note.    Patient was diagnosed with dilated cardiomyopathy in 6/19 in [place].  LHC/RHC showed elevated filling pressures, low output (numbers not accessible) and no significant coronary disease.  He was admitted with progressive dyspnea.  Echo done today was reviewed, EF 15%, moderate-severe probably functional MR, tricuspid valve malcoaptation with severe TR, severely dilated RV.  He is has been somewhat lethargic today.  He is in NSR, SBP 100s-110s.  Mildly tachy HR  around 100. Scleral icterus with tbili 3.6.   On exam, JVP 16 cm.  Cheyne-Stokes breathing.  Regular S1S2, mildly tachy, soft S3, lateral PMI, 3/6 HSM LLSB/apex.  1+ ankle edema.   1. Acute on chronic systolic CHF: Nonischemic cardiomyopathy based on cath 7/19 in [place]. Remote heavy ETOH, more recently about a 6 pack/week.  No drugs.  Echo was done today with biventricular failure.  There is is moderate to severe MR and severe TR with poor coaptation of TV leaflets.  On exam, he is markedly volume overloaded.  With lethargy and Cheyne Stokes breathing, I am concerned for low output HF.  - Suspect low output, place PICC line (will be done now) => follow CVP and co-ox, suspect he will need milrinone (will get co-ox tonight when PICC line is placed then likely start milrinone).  Will move to step-down.  - Plan for eventual cardiac MRI.  - Marked volume overload, continue Lasix 80 mg IV bid.  -  No beta blocker with suspicion for low output.  - No ACEI/ARB/spironolactone for now with soft BP at times, will try to start.  - Will eventually need RHC again this admission.  - Will likely need transplant versus LVAD evaluation.  LVAD may not be feasible with RV failure.  - HIV check pending.  2. OSA: Restart pm CPAP.  3. COPD: With lethargy, will get ABG.  Will need eventual PFTs.  He no longer smokes.  4. 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  - RUQ [name] to assess for cirrhosis.  - Check NH3.   Marca Ancona 07/21/2018 5:17 PM

## 2018-07-21 NOTE — ED Triage Notes (Signed)
Pt reports "I have fluid built on my lungs.  Can't lay down and sleep."  Pt wearing an external defibrillator.

## 2018-07-21 NOTE — Progress Notes (Signed)
  Echocardiogram 2D Echocardiogram has been performed.  Manuella Blackson G Tytianna Greenley 07/21/2018, 12:12 PM

## 2018-07-21 NOTE — ED Provider Notes (Addendum)
[facility] EMERGENCY DEPARTMENT Provider Note   CSN: [id number] Arrival date & time: 07/21/18  0518     History   Chief Complaint Chief Complaint  Patient presents with  . Shortness of Breath  . Chest Pain    HPI CARLESS SLATTEN is a 57 y.o. male.  HPI   THADDAEUS GRANJA is a 57 y.o. male, with a history of CHF with EF of 25%, heart transplant candidacy through [facility], cardiomyopathy, and LifeVest external defibrillator, presenting to the ED with shortness of breath for two days.  Accompanied by nausea, orthopnea, and lower extremity edema.  He states he has had increased lower extremity edema since mid August.  On August 22, his Lasix was increased to 100 mg twice daily, which has improved his lower extremity edema.  He experienced some chest pain last night, left chest, 5-6/10, "felt like a twinge," nonradiating.  Lasted for approximately 15 minutes.  No chest pain today. Patient states he is typically followed through the [place] for his healthcare.  He is being evaluated for heart transplant through [facility].  He cannot recall the physician's name with whom he met there at [facility]. Denies fever, cough, abdominal pain, vomiting, diarrhea, dizziness, syncope, or any other complaints.   Past Medical History:  Diagnosis Date  . Cardiomyopathy, unspecified ([place])   . CHF (congestive heart failure) ([place])   . Chronic back pain   . Enlarged heart   . Gastric ulcer   . Gastroenteritis   . H/O degenerative disc disease     Patient Active Problem List   Diagnosis Date Noted  . COPD (chronic obstructive pulmonary disease) ([place]) 07/21/2018  . HTN (hypertension) 07/21/2018  . Fibromyalgia syndrome 01/27/2017  . Myofascial pain syndrome, cervical 01/27/2017  . Lumbar radiculopathy 01/24/2014  . History of lumbar laminectomy for spinal cord decompression 12/07/2013  . 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    History reviewed. No  pertinent surgical history.      Home Medications    Prior to Admission medications   Medication Sig Start Date End Date Taking? Authorizing Provider  aspirin EC 81 MG tablet Take 81 mg by mouth daily.   Yes [provider]  atorvastatin (LIPITOR) 20 MG tablet Take 10 mg by mouth daily.    Yes [provider]  carbamide peroxide (DEBROX) 6.5 % otic solution Place 5 drops into both ears 3 (three) times daily. 5-10 drops   Yes [provider]  clotrimazole (LOTRIMIN) 1 % external solution Apply 1 application topically daily.   Yes [provider]  cyanocobalamin 500 MCG tablet Take 500 mcg by mouth daily.   Yes [provider]  DULoxetine (CYMBALTA) 60 MG capsule Take 60 mg by mouth daily.   Yes [provider]  fluticasone (FLONASE) 50 MCG/ACT nasal spray Place 1 spray into both nostrils 2 (two) times daily as needed for allergies.    Yes [provider]  furosemide (LASIX) 80 MG tablet Take 100 mg by mouth 2 (two) times daily.  06/10/18  Yes [provider]  gabapentin (NEURONTIN) 300 MG capsule Take 600 mg by mouth 3 (three) times daily.   Yes [provider]  hydrOXYzine (VISTARIL) 25 MG capsule Take 25 mg by mouth 3 (three) times daily as needed for anxiety.   Yes [provider]  Ipratropium-Albuterol (COMBIVENT) 20-100 MCG/ACT AERS respimat Inhale 1 puff into the lungs every 6 (six) hours.   Yes [provider]  lidocaine (LIDODERM) 5 % Place 1 patch onto the skin daily. Remove & Discard patch within 12 hours or as directed by MD   Yes [provider]  metFORMIN (GLUCOPHAGE-XR) 500 MG 24 hr tablet Take 500 mg by mouth 2 (two) times daily.   Yes [provider]  methocarbamol (ROBAXIN) 750 MG tablet Take 750 mg by mouth 3 (three) times daily as needed for muscle spasms.  02/21/14  Yes [provider]  omeprazole (PRILOSEC) 20 MG capsule Take 20 mg by mouth daily.   Yes  [provider]  polyethylene glycol (MIRALAX / GLYCOLAX) packet Take 17 g by mouth daily as needed for mild constipation.   Yes [provider]  sildenafil (VIAGRA) 100 MG tablet Take 50 mg by mouth daily as needed for erectile dysfunction.   Yes [provider]  thiamine 100 MG tablet Take 100 mg by mouth daily.   Yes [provider]    Family History No family history on file.  Social History Social History   Tobacco Use  . Smoking status: Current Every Day Smoker    Types: Cigarettes    Start date: 2003  . Smokeless tobacco: Never Used  Substance Use Topics  . Alcohol use: Not Currently  . Drug use: Not Currently     Allergies   Patient has no known allergies.   Review of Systems Review of Systems  Constitutional: Negative for chills, diaphoresis and fever.  Respiratory: Positive for shortness of breath. Negative for cough.   Cardiovascular: Positive for chest pain (resolved) and leg swelling.  Gastrointestinal: Positive for nausea. Negative for abdominal pain, diarrhea and vomiting.  All other systems reviewed and are negative.    Physical Exam Updated Vital Signs BP 102/85 (BP Location: Left Arm)   Pulse 97   Temp 97.9 F (36.6 C) (Oral)   Resp (!) 26   Ht 5' 2" (1.575 m)   Wt 67.6 kg   SpO2 99%   BMI 27.25 kg/m   Physical Exam  Constitutional: He appears well-developed and well-nourished. No distress.  HENT:  Head: Normocephalic and atraumatic.  Eyes: Conjunctivae are normal.  Neck: Neck supple.  Cardiovascular: Normal rate, regular rhythm, normal heart sounds and intact distal pulses.  Pulmonary/Chest: He has wheezes in the right lower field and the left lower field.  Patient initially with expiratory wheezing in the lung bases.  Wheezing resolved and shortness of breath improved following albuterol nebulizer.  Abdominal: Soft. There is no tenderness. There is no guarding.  Musculoskeletal: He exhibits edema.    Some mild, bilateral, pitting lower extremity edema.  No tenderness, erythema, or increased warmth.  Lymphadenopathy:    He has no cervical adenopathy.  Neurological: He is alert.  Skin: Skin is warm and dry. He is not diaphoretic.  Psychiatric: He has a normal mood and affect. His behavior is normal.  Nursing note and vitals reviewed.    ED Treatments / Results  Labs (all labs ordered are listed, but only abnormal results are displayed) Labs Reviewed  CBC WITH DIFFERENTIAL/PLATELET - Abnormal; Notable for the following components:      Result Value   RDW 15.7 (*)    Platelets 148 (*)    Monocytes Absolute 1.2 (*)    All other components within normal limits  BASIC METABOLIC PANEL - Abnormal; Notable for the following components:   BUN 22 (*)    All other components within normal limits  BRAIN NATRIURETIC PEPTIDE - Abnormal; Notable for the  following components:   B Natriuretic Peptide 1,343.9 (*)    All other components within normal limits  I-STAT TROPONIN, ED    EKG EKG Interpretation  Date/Time:  Tuesday July 21 2018 05:34:29 EDT Ventricular Rate:  96 PR Interval:    QRS Duration: 100 QT Interval:  370 QTC Calculation: 468 R Axis:   -46 Text Interpretation:  Sinus rhythm Ventricular premature complex Probable left atrial enlargement Inferior infarct, old Anterior infarct, old Lateral leads are also involved Confirmed by Dene Gentry [phone]) on 07/21/2018 7:12:56 AM Also confirmed by Dene Gentry [phone]), editor Hattie Perch ([id number])  on 07/21/2018 7:52:39 AM   Radiology Dg Chest 2 View  Result Date: 07/21/2018 CLINICAL DATA:  Chest pain, shortness of breath and edema for 2 days. EXAM: CHEST - 2 VIEW COMPARISON:  Chest radiograph June 20, 2018 FINDINGS: Stable cardiomegaly. No pleural effusion or focal consolidation. Mediastinal silhouette is normal. Pulmonary vasculature appears normal. No pneumothorax. Soft tissue planes and included osseous structures  are non suspicious. IMPRESSION: Stable cardiomegaly.  No acute pulmonary process. Electronically Signed   By: Elon Alas M.D.   On: 07/21/2018 06:09    Procedures .Critical Care Performed by: Lorayne Bender, PA-C Authorized by: Lorayne Bender, PA-C   Critical care provider statement:    Critical care time (minutes):  35   Critical care time was exclusive of:  Separately billable procedures and treating other patients   Critical care was necessary to treat or prevent imminent or life-threatening deterioration of the following conditions:  Cardiac failure   Critical care was time spent personally by me on the following activities:  Development of treatment plan with patient or surrogate, discussions with consultants, evaluation of patient's response to treatment, examination of patient, obtaining history from patient or surrogate, ordering and performing treatments and interventions, ordering and review of laboratory studies, ordering and review of radiographic studies, pulse oximetry, re-evaluation of patient's condition and review of old charts   I assumed direction of critical care for this patient from another provider in my specialty: no     (including critical care time)  Medications Ordered in ED Medications  furosemide (LASIX) injection 80 mg (has no administration in time range)  albuterol (PROVENTIL) (2.5 MG/3ML) 0.083% nebulizer solution 5 mg (5 mg Nebulization Given 07/21/18 0607)     Initial Impression / Assessment and Plan / ED Course  I have reviewed the triage vital signs and the nursing notes.  Pertinent labs & imaging results that were available during my care of the patient were reviewed by me and considered in my medical decision making (see chart for details).  Clinical Course as of Jul 22 755  Tue Jul 21, 2018  0630 Consistent with most recent previous value.  Platelets(!): 148 [SJ]  0746 Spoke with Dr. Danford Bad, IM resident, who will come evaluate the patient for  admission.   [SJ]    Clinical Course User Index [SJ] Joy, Shawn C, PA-C   Patient with complex cardiac and heart failure history presents with complaint of shortness of breath.  Peripheral edema with BNP over 1300, suspect CHF exacerbation.  He has no local cardiologist.  Admitted through internal medicine teaching service.  Findings and plan of care discussed with Dene Gentry, MD. Dr. Francia Greaves personally evaluated and examined this patient.  Vitals:   07/21/18 0615 07/21/18 0645 07/21/18 0700 07/21/18 0749  BP: 112/89 (!) 113/95 105/86 100/78  Pulse:  100 94 94  Resp:  18 (!) 28 (!) 28  Temp:      TempSrc:      SpO2:  100% 99% 99%  Weight:      Height:         Final Clinical Impressions(s) / ED Diagnoses   Final diagnoses:  Shortness of breath  Acute on chronic congestive heart failure, unspecified heart failure type [facility])    ED Discharge Orders    None       Layla Maw 07/21/18 0803    Valarie Merino, MD 07/23/18 1114   Added critical care time.   Lorayne Bender, PA-C 07/31/18 1745    Valarie Merino, MD 08/03/18 [phone]

## 2018-07-21 NOTE — Progress Notes (Signed)
Peripherally Inserted Central Catheter/Midline Placement  The IV Nurse has discussed with the patient and/or persons authorized to consent for the patient, the purpose of this procedure and the potential benefits and risks involved with this procedure.  The benefits include less needle sticks, lab draws from the catheter, and the patient may be discharged home with the catheter. Risks include, but not limited to, infection, bleeding, blood clot (thrombus formation), and puncture of an artery; nerve damage and irregular heartbeat and possibility to perform a PICC exchange if needed/ordered by physician.  Alternatives to this procedure were also discussed.  Bard Power PICC patient education guide, fact sheet on infection prevention and patient information card has been provided to patient /or left at bedside.    PICC/Midline Placement Documentation  PICC Double Lumen 07/21/18 PICC Right Basilic 39 cm 0 cm (Active)  Indication for Insertion or Continuance of Line Vasoactive infusions 07/21/2018  6:00 PM  Exposed Catheter (cm) 0 cm 07/21/2018  6:00 PM  Site Assessment Clean;Dry;Intact 07/21/2018  6:00 PM  Lumen #1 Status Flushed;Blood return noted 07/21/2018  6:00 PM  Lumen #2 Status Flushed;Blood return noted 07/21/2018  6:00 PM  Dressing Type Transparent 07/21/2018  6:00 PM  Dressing Status Clean;Dry;Intact;Antimicrobial disc in place 07/21/2018  6:00 PM  Dressing Intervention New dressing 07/21/2018  6:00 PM  Dressing Change Due 07/28/18 07/21/2018  6:00 PM       Stacie Glaze Horton 07/21/2018, 6:14 PM

## 2018-07-21 NOTE — Progress Notes (Signed)
After lasix was administered pt was noted to have cheyne-stokes respiration with periods of apnea lasting 30-35 seconds. Pt was difficult to arouse and unable to answer questions. SaO2 remained 94% or greater during apnea. Radial pulse rate 100%. Primary nurse notified. Oxgen was started at 2L per min using nasal canula and normal saline IV began 10 ml per hour after orders received.

## 2018-07-22 ENCOUNTER — Inpatient Hospital Stay (HOSPITAL_COMMUNITY): Payer: Medicare Other

## 2018-07-22 DIAGNOSIS — E785 Hyperlipidemia, unspecified: Secondary | ICD-10-CM

## 2018-07-22 DIAGNOSIS — I43 Cardiomyopathy in diseases classified elsewhere: Secondary | ICD-10-CM

## 2018-07-22 DIAGNOSIS — F419 Anxiety disorder, unspecified: Secondary | ICD-10-CM

## 2018-07-22 DIAGNOSIS — Z79899 Other long term (current) drug therapy: Secondary | ICD-10-CM

## 2018-07-22 DIAGNOSIS — G4733 Obstructive sleep apnea (adult) (pediatric): Secondary | ICD-10-CM

## 2018-07-22 DIAGNOSIS — I5043 Acute on chronic combined systolic (congestive) and diastolic (congestive) heart failure: Secondary | ICD-10-CM

## 2018-07-22 DIAGNOSIS — Z95828 Presence of other vascular implants and grafts: Secondary | ICD-10-CM

## 2018-07-22 DIAGNOSIS — R188 Other ascites: Secondary | ICD-10-CM

## 2018-07-22 DIAGNOSIS — D696 Thrombocytopenia, unspecified: Secondary | ICD-10-CM

## 2018-07-22 DIAGNOSIS — M7989 Other specified soft tissue disorders: Secondary | ICD-10-CM

## 2018-07-22 DIAGNOSIS — I5082 Biventricular heart failure: Secondary | ICD-10-CM

## 2018-07-22 DIAGNOSIS — J449 Chronic obstructive pulmonary disease, unspecified: Secondary | ICD-10-CM

## 2018-07-22 DIAGNOSIS — Z7984 Long term (current) use of oral hypoglycemic drugs: Secondary | ICD-10-CM

## 2018-07-22 DIAGNOSIS — Z7289 Other problems related to lifestyle: Secondary | ICD-10-CM

## 2018-07-22 DIAGNOSIS — E119 Type 2 diabetes mellitus without complications: Secondary | ICD-10-CM

## 2018-07-22 DIAGNOSIS — I110 Hypertensive heart disease with heart failure: Principal | ICD-10-CM

## 2018-07-22 DIAGNOSIS — I82491 Acute embolism and thrombosis of other specified deep vein of right lower extremity: Secondary | ICD-10-CM

## 2018-07-22 DIAGNOSIS — Z720 Tobacco use: Secondary | ICD-10-CM

## 2018-07-22 DIAGNOSIS — K7460 Unspecified cirrhosis of liver: Secondary | ICD-10-CM

## 2018-07-22 LAB — COMPREHENSIVE METABOLIC PANEL
ALBUMIN: 3.1 g/dL — AB (ref 3.5–5.0)
ALT: 13 U/L (ref 0–44)
ANION GAP: 9 (ref 5–15)
AST: 20 U/L (ref 15–41)
Alkaline Phosphatase: 110 U/L (ref 38–126)
BUN: 17 mg/dL (ref 6–20)
CHLORIDE: 106 mmol/L (ref 98–111)
CO2: 28 mmol/L (ref 22–32)
Calcium: 9.3 mg/dL (ref 8.9–10.3)
Creatinine, Ser: 1.13 mg/dL (ref 0.61–1.24)
GFR calc Af Amer: >60 mL/min (ref >60)
GLUCOSE: 104 mg/dL — AB (ref 70–99)
POTASSIUM: 3.2 mmol/L — AB (ref 3.5–5.1)
Sodium: 143 mmol/L (ref 135–145)
Total Bilirubin: 3.5 mg/dL — ABNORMAL HIGH (ref 0.3–1.2)
Total Protein: 6 g/dL — ABNORMAL LOW (ref 6.5–8.1)

## 2018-07-22 LAB — GLUCOSE, CAPILLARY
GLUCOSE-CAPILLARY: 89 mg/dL (ref 70–99)
GLUCOSE-CAPILLARY: 116 mg/dL — AB (ref 70–99)
Glucose-Capillary: 97 mg/dL (ref 70–99)
Glucose-Capillary: 100 mg/dL — ABNORMAL HIGH (ref 70–99)

## 2018-07-22 LAB — CBC
HEMATOCRIT: 39.7 % (ref 39.0–52.0)
Hemoglobin: 12.7 g/dL — ABNORMAL LOW (ref 13.0–17.0)
MCH: 28.1 pg (ref 26.0–34.0)
MCHC: 32 g/dL (ref 30.0–36.0)
MCV: 87.8 fL (ref 78.0–100.0)
Platelets: 152 10*3/uL (ref 150–400)
RBC: 4.52 MIL/uL (ref 4.22–5.81)
RDW: 15.8 % — AB (ref 11.5–15.5)
WBC: 6.3 10*3/uL (ref 4.0–10.5)

## 2018-07-22 LAB — COOXEMETRY PANEL
Carboxyhemoglobin: 1.9 % — ABNORMAL HIGH (ref 0.5–1.5)
Carboxyhemoglobin: 2.1 % — ABNORMAL HIGH (ref 0.5–1.5)
METHEMOGLOBIN: 1.4 % (ref 0.0–1.5)
Methemoglobin: 1.7 % — ABNORMAL HIGH (ref 0.0–1.5)
O2 Saturation: 52.6 %
O2 Saturation: 70.4 %
TOTAL HEMOGLOBIN: 12.8 g/dL (ref 12.0–16.0)
Total hemoglobin: 13.1 g/dL (ref 12.0–16.0)

## 2018-07-22 LAB — PROTIME-INR
INR: 1.41
Prothrombin Time: 17.2 seconds — ABNORMAL HIGH (ref 11.4–15.2)

## 2018-07-22 LAB — HIV ANTIBODY (ROUTINE TESTING W REFLEX): HIV Screen 4th Generation wRfx: NONREACTIVE

## 2018-07-22 MED ORDER — HEPARIN (PORCINE) IN NACL 100-0.45 UNIT/ML-% IJ SOLN
1350.0000 [IU]/h | INTRAMUSCULAR | Status: DC
  Administered 2018-07-22: 1050 [IU]/h via INTRAVENOUS
  Administered 2018-07-23: 1250 [IU]/h via INTRAVENOUS
  Administered 2018-07-24: 1350 [IU]/h via INTRAVENOUS
  Filled 2018-07-22 (×3): qty 250

## 2018-07-22 MED ORDER — DIGOXIN 125 MCG PO TABS
0.1250 mg | ORAL_TABLET | Freq: Every day | ORAL | Status: DC
  Administered 2018-07-22 – 2018-08-12 (×21): 0.125 mg via ORAL
  Filled 2018-07-22 (×21): qty 1

## 2018-07-22 MED ORDER — POTASSIUM CHLORIDE CRYS ER 20 MEQ PO TBCR
40.0000 meq | EXTENDED_RELEASE_TABLET | Freq: Two times a day (BID) | ORAL | Status: DC
  Administered 2018-07-22 – 2018-07-23 (×4): 40 meq via ORAL
  Filled 2018-07-22 (×4): qty 2

## 2018-07-22 MED ORDER — LOSARTAN POTASSIUM 25 MG PO TABS
12.5000 mg | ORAL_TABLET | Freq: Every day | ORAL | Status: DC
  Administered 2018-07-22: 12.5 mg via ORAL
  Filled 2018-07-22: qty 1

## 2018-07-22 MED ORDER — HEPARIN BOLUS VIA INFUSION
3350.0000 [IU] | Freq: Once | INTRAVENOUS | Status: AC
  Administered 2018-07-22: 3350 [IU] via INTRAVENOUS
  Filled 2018-07-22: qty 3350

## 2018-07-22 NOTE — Progress Notes (Signed)
RLE venous duplex prelim: short segment of DVT Right peroneal vein. Farrel Demark, RDMS, RVT    Called results to [facility]

## 2018-07-22 NOTE — Progress Notes (Signed)
  Date: 07/22/2018  Patient name: Theodore Welch  Medical record number: [id number]  Date of birth: 1961-11-16   I have seen and evaluated Theodore Welch and discussed their care with the Residency Team. Briefly, Theodore Welch is a 57yo man with PMH of DM2, HLD, HTN, OSA and combined NICM who presented with worsening LE edema, orthopnea and PND.  He was increased on his lasix with only minimal improvement.  He denies any increased sodium intake and reports only social drinking to [name], and possibly heavy ETOH use in the 70s.  In the ED, he was noted to be SOB but with good saturation on pulse ox.  He was given lasix and improved somewhat.  However, on re-evaluation by the team he was not doing well.  TTE showed a worsening of EF.  Heart Failure team did see him and started him on milrinone.  He is improved this morning.    Vitals:   07/22/18 0406 07/22/18 0716  BP: 92/76 106/86  Pulse: 86 92  Resp: 15 (!) 21  Temp:  98.2 F (36.8 C)  SpO2: 99% 97%   General: Thin gentleman, sitting up in bed, NAD Eyes: Anicteric sclerae HENT: JVP ~ 12cm, neck supple CV: Regular, no murmur noted Pulm: CTAB, no wheezing Abd: Soft, + distention, non tender Ext: Trace edema only, no rash or wounds Neuro: Grossly intact, moving all extremities, conversing easily.    Cr. 1.13  AST/ALT WNL  Assessment and Plan: I have seen and evaluated the patient as outlined above. I agree with the formulated Assessment and Plan as detailed in the residents' note, with the following changes:   1. Acute on chronic severe biventricular CHF, NICM - Medication optimization has been difficult due to low blood pressures, now on milrinone - CHF team following, have reviewed documentation and recommendations - Lasix 80mg  IV BID - Added losartan and spironolactone - Recommended c MRI once patient can lie flat - Monitor in SDU on telemetry - Daily weight - I/Os - Continue statin and aspirin - Off life vest for now - BMET  daily for renal function - May need transplant evaluation  2. DM2 - SSI, monitor CBG  3. Mild thrombocytopenia - Improved today, hepatic function WNL - Abd [name] with possible cirrhosis  -? Related to ETOH - Would check hepatitis panel  Other issues per resident note.   Inez Catalina, MD 9/4/20199:57 AM

## 2018-07-22 NOTE — Progress Notes (Addendum)
Subjective:  Theodore Welch is alert on exam. PICC line place last night and patient started on milrinone. He is net negative 2 L in last 24hrs.   He reports heavy alcohol use in the 70's and calls himself a social drinker. He is unable to give a certain number of drinks, but says he drinks a beer when watching a football game. He denies any family history of non - ischemic heart disease and reports his parents were healthy.   Objective:  Vital signs in last 24 hours: Vitals:   07/22/18 0406 07/22/18 0500 07/22/18 0716 07/22/18 1104  BP: 92/76  106/86 104/84  Pulse: 86  92 99  Resp: 15  (!) 21   Temp:   98.2 F (36.8 C) 97.6 F (36.4 C)  TempSrc:   Oral Oral  SpO2: 99%  97% 97%  Weight:  66.5 kg    Height:       Weight change: 0.136 kg  Intake/Output Summary (Last 24 hours) at 07/22/2018 1315 Last data filed at 07/22/2018 1152 Gross per 24 hour  Intake 649.03 ml  Output 3075 ml  Net -2425.97 ml   Physical Exam  Eyes: No scleral icterus.    Physical Exam Gen: Alert, NAD. Resting in bed , bed inclined HENT: /AT. Old scar on forehead  Eyes: No scleral icterus Neck: JVD present CV: RRR, no mrg Pulm: Bilateral rales in the bases Abdominal: Soft, NT MS: no LE edema      Assessment/Plan:  Principal Problem:   CHF (congestive heart failure) (HCC) Active Problems:   Tobacco use   Congestive heart failure (HCC)   Type 2 diabetes mellitus (HCC)   Hyperlipidemia   Ascites    Severe biventricular CHF 2/2 NICM Echo on 07/21/18 shows LVEF 10-20%. Patient presented with PND,orthopnea, and LE edema that has worsened over the past 2-3 weeks. PICC line placed. He is feeling better with diuresis and addition of milrinone overnight. He put out 2L overnight. Bilateral crackles at the bases. LE edema is much improved.   Will send for cardiac c-MRI when patient can tolerate lying flat. Appreciate possible RHC on Friday after pt is fully diuresed for further evaluation for advanced  therapies.   - following cardiology-heart failure recommendations  - Lasix 56m BID - Continue spirolactone 12.576mqd, ASA, Statin - Cardiology managing milrinone - Start losartan 12.28m30md - Start digoxin .1228m3mily   Cirrhosis  On admission total Billi 3.4 , Direct 1.1 / Albumin 3.9 / INR 1.4. Nl AST/ALT/Alk P. Mild thrombocytopenia on admission.  RUQ US 9[name] shows ascites and cirrhotic changes. -hepatitis panel   Mild Thrombocytopenia:  141,000 1 month ago. On admission 148,000, and 160 on repeat today.  - check B-12, Folate  DM2 Hgb A1c 6.5%. Holding home Metformin 500 mg BID.  - HH/Carb mod diet - Sensitive sling-scale insulin  HLD : LDL 57 HDL 32 on 9/4 -continue home atorvostatin 10mg70mxiety - Continue home Cymbalta (also for chronic low back pain) - Continue home PRN Hydroxyzine 228mg 70m  COPD : Recent PFT's completed at OSH.  Tobacco use  -nicotine patch PRN   Dispo: Pending further diuresis , workup , and planning of advance therapy options for biventricular CHF. Patient on milrinone; Will need planning to go home or to skilled nursing facility.   Diet: HH/Carb mod DVT PPx: Lovenox IVF: None Code: Full   Rhandi Despain,Lyndal Pulleycal Student 07/22/2018, 1:15 PM   Addendum: Nursing reports RLE venous duplex shows  DVT R. Peroneal vein. Will start heparin. Appreciate pharmacy recommendations.

## 2018-07-22 NOTE — Progress Notes (Addendum)
Advanced Heart Failure Rounding Note  PCP-Cardiologist: No primary care provider on file.   Subjective:    PICC line placed 07/21/18. Initial Coox 43%. Started on milrinone 0.25 mcg/kg/min.   Coox 52.6% this am. He diuresed well, CVP 13 with weight down 3 lbs.   Feeling much better this am on milrinone. More alert. Denies SOB at rest. No lightheadedness or dizziness, but hasn't really been out of bed.   Objective:   Weight Range: 66.5 kg Body mass index is 21.04 kg/m.   Vital Signs:   Temp:  [97.5 F (36.4 C)-98.2 F (36.8 C)] 98.2 F (36.8 C) (09/04 0716) Pulse Rate:  [86-107] 92 (09/04 0716) Resp:  [13-30] 21 (09/04 0716) BP: (92-118)/(73-95) 106/86 (09/04 0716) SpO2:  [97 %-100 %] 97 % (09/04 0716) Weight:  [66.5 kg-67.7 kg] 66.5 kg (09/04 0500)    Weight change: Filed Weights   07/21/18 0529 07/21/18 1108 07/22/18 0500  Weight: 67.6 kg 67.7 kg 66.5 kg    Intake/Output:   Intake/Output Summary (Last 24 hours) at 07/22/2018 0806 Last data filed at 07/22/2018 0400 Gross per 24 hour  Intake 289.03 ml  Output 2250 ml  Net -1960.97 ml      Physical Exam    General:  Fatigued appearing. No resp difficulty HEENT: Normal Neck: Supple. JVP ~12 cm. Carotids 2+ bilat; no bruits. No lymphadenopathy or thyromegaly appreciated. Cor: PMI nondisplaced. Regular. ? S3.  Lungs: Clear Abdomen: + distended, nontender. No hepatosplenomegaly. No bruits or masses. Good bowel sounds. Extremities: No cyanosis, clubbing, or rash. Trace edema.  Neuro: Alert & orientedx3, cranial nerves grossly intact. moves all 4 extremities w/o difficulty. Affect pleasant   Telemetry   NSR 80-90s, personally reviewed  EKG    No new tracings.    Labs    CBC Recent Labs    07/21/18 0540 07/21/18 1012 07/22/18 0437  WBC 9.0 7.8 6.3  NEUTROABS 5.8  --   --   HGB 13.1 14.1 12.7*  HCT 40.4 43.2 39.7  MCV 87.8 86.7 87.8  PLT 148* 160 152   Basic Metabolic Panel Recent Labs   07/21/18 0540 07/21/18 1012 07/22/18 0437  NA 137  --  143  K 3.6  --  3.2*  CL 99  --  106  CO2 24  --  28  GLUCOSE 93  --  104*  BUN 22*  --  17  CREATININE 1.15 1.10 1.13  CALCIUM 9.6  --  9.3   Liver Function Tests Recent Labs    07/21/18 1012 07/22/18 0437  AST 23 20  ALT 15 13  ALKPHOS 126 110  BILITOT 3.4* 3.5*  PROT 7.5 6.0*  ALBUMIN 3.9 3.1*   No results for input(s): LIPASE, AMYLASE in the last 72 hours. Cardiac Enzymes Recent Labs    07/21/18 1012 07/21/18 1447 07/21/18 2113  TROPONINI <0.03 <0.03 <0.03    BNP: BNP (last 3 results) Recent Labs    07/21/18 0540  BNP 1,343.9*    ProBNP (last 3 results) No results for input(s): PROBNP in the last 8760 hours.   D-Dimer No results for input(s): DDIMER in the last 72 hours. Hemoglobin A1C Recent Labs    07/21/18 1012  HGBA1C 6.5*   Fasting Lipid Panel Recent Labs    07/21/18 1012  CHOL 97  HDL 32*  LDLCALC 57  TRIG 39  CHOLHDL 3.0   Thyroid Function Tests No results for input(s): TSH, T4TOTAL, T3FREE, THYROIDAB in the last 72 hours.  Invalid input(s): FREET3  Other results:   Imaging    Ct Angio Chest Pe W Or Wo Contrast  Result Date: 07/21/2018 CLINICAL DATA:  Shortness of breath, diabetes mellitus, hypertension, occasional smoker EXAM: CT ANGIOGRAPHY CHEST WITH CONTRAST TECHNIQUE: Multidetector CT imaging of the chest was performed using the standard protocol during bolus administration of intravenous contrast. Multiplanar CT image reconstructions and MIPs were obtained to evaluate the vascular anatomy. CONTRAST:  ISOVUE-370 IOPAMIDOL (ISOVUE-370) INJECTION 76% IV COMPARISON:  None FINDINGS: Cardiovascular: Mild atherosclerotic calcification aorta. Aorta normal caliber. Pulmonary arteries wel opacified and patent. No evidence of pulmonary embolism. Dilatation of cardiac chambers. Minimal pericardial fluid or thickening. Significant reflux of contrast into IVC and hepatic veins.  Mediastinum/Nodes: Esophagus unremarkable. Enlarged RIGHT paratracheal lymph node 11 mm short axis image 39. Few additional scattered normal size mediastinal lymph nodes. Base of cervical region unremarkable. Lungs/Pleura: Minimal dependent atelectasis in the posterior lungs. Lungs otherwise clear. No pulmonary infiltrate, pleural effusion or pneumothorax. Upper Abdomen: Significant ascites in upper abdomen. Spleen normal size. Hepatic margins are question slightly irregular cannot exclude cirrhosis. Musculoskeletal: No acute osseous findings. Review of the MIP images confirms the above findings. IMPRESSION: No evidence of pulmonary embolism. No significant intrathoracic abnormalities. Upper abdominal ascites with question slightly nodular hepatic margins cannot exclude cirrhosis. Aortic Atherosclerosis (ICD10-I70.0). Electronically Signed   By: Ulyses Southward M.D.   On: 07/21/2018 13:24   [name] Ekg Site Rite  Result Date: 07/21/2018 If Site Rite image not attached, placement could not be confirmed due to current cardiac rhythm.  US Abdomen Limited Ruq  Result Date: 07/21/2018 CLINICAL DATA:  Abdominal distention and lower extremity edema for 2-3 weeks. Question cirrhosis or ascites. EXAM: ULTRASOUND ABDOMEN LIMITED RIGHT UPPER QUADRANT COMPARISON:  None. FINDINGS: Gallbladder: No gallstones or wall thickening visualized. The gallbladder is nondistended and single wall thickness is 2.2 mm. No sonographic Murphy sign noted by sonographer. Common bile duct: Diameter: 3.1 mm proximally and 6.7 mm along the midportion. No choledocholithiasis. Liver: No space-occupying mass of the included liver however there are morphologic changes of cirrhosis with surface nodularity noted in slight coarsened appearance of the liver echotexture. Moderate volume of ascites is demonstrated within all 4 quadrants. Portal vein is patent on color Doppler imaging with normal direction of blood flow towards the liver. IMPRESSION: 1. Cirrhotic  appearance of the liver with moderate volume ascites. No space-occupying mass of the liver is identified. 2. Unremarkable appearance of the gallbladder. Electronically Signed   By: Tollie Eth M.D.   On: 07/21/2018 21:29      Medications:     Scheduled Medications: . aspirin EC  81 mg Oral Daily  . atorvastatin  10 mg Oral Daily  . digoxin  0.125 mg Oral Daily  . DULoxetine  60 mg Oral Daily  . enoxaparin (LOVENOX) injection  40 mg Subcutaneous Q24H  . furosemide  80 mg Intravenous BID  . gabapentin  600 mg Oral TID  . insulin aspart  0-9 Units Subcutaneous TID WC  . losartan  12.5 mg Oral Daily  . pantoprazole  40 mg Oral Daily  . potassium chloride  40 mEq Oral BID  . sodium chloride flush  10-40 mL Intracatheter Q12H  . sodium chloride flush  3 mL Intravenous Q12H  . spironolactone  12.5 mg Oral Daily  . thiamine  100 mg Oral Daily     Infusions: . milrinone 0.25 mcg/kg/min (07/22/18 0400)     PRN Medications:  acetaminophen **OR** acetaminophen, hydrOXYzine,  ipratropium-albuterol, methocarbamol, nicotine, senna-docusate, sodium chloride flush    Patient Profile   Theodore Welch is a 57 y.o. 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  Admitted with worsening SOB and orthopnea in setting of severe biventricular CHF.   Assessment/Plan   1. Severe, biventricular CHF -> NICM - Echo 07/21/18 LVEF 10-20%,  Severe, diffuse, HK with no specific WMA, Grade 3 DD, diastolic flattening of septum, Moderate to severe MR, Massive LAE, Severe RV dilation, Massive RAE, Malcoaption of the TV leaflets due to RV dilation with severe TR, Mod to Severe PI, trivial pericardial effusion - Live vest off for no. Will need to continue on d/c. - Medication optimization has been limited by low SBPs. He is not on ACE/ARB, BB, or nitrate.  - Coox 52.6% on milrinone 0.25 mcg/kg/min this am. Will increase milrinone to 0.375  mcg/kg/min and follow.  - Volume status remains elevated. CVP 13 - Continue lasix 80 mg IV BID for now  - Avoid BB with low output.  - Add low dose losartan 12.5 mg daily.  - Continue  spiro 12.5 mg daily - Add digoxin 0.125 mg daily.  - Ammonia 64 07/21/18 - CTA PE negative. Doppler RLE pending - UDS pending. - Will need cMRI once can tolerate without orthopnea.  - Will likely need transplant evaluation. Poor candidate for LVAD with severe biventricular failure.  2. COPD - Stable. No wheeze.  3. DM2 - Per primary. Per primary. Hgb A1c 6.5% 07/21/18 4. History of EtOH Use - No heavy ETOH > 3 months ago. Still drinks about a 6-pack/week.  5. Tobacco Use - No PFTs available in system. ( complete at OSH)  - Encouraged cessation.  - Has Nicotine patch PRN 6. Anxiety - Per primary. Continuing home meds.  7. OSA on CPAP - Reports compliance. Continue qhs and prn.  8. Cirrhosis - With ascites and cirrhotic changes noted on RUQ [name] 07/21/18. - Ammonia 64 this am. Mentation much more clear with inotrope support. 9. Hypokalemia - K 3.2. Will supp.   Medication concerns reviewed with patient and pharmacy team. Barriers identified: None at this time.   Length of Stay: 1  Luane School  07/22/2018, 8:06 AM  Advanced Heart Failure Team Pager [phone] (M-F; 7a - 4p)  Please contact [facility] for night-coverage after hours (4p -7a ) and weekends on [email or web address]  Patient seen with PA, agree with the above note.  This morning, he looks better on milrinone.  Co-ox up to 53% but still low. Much clearer mentally.  Diuresed well, weight down 3 lbs.  CVP 13.   On exam, JVP 12-14 cm.  Regular S1S2, mildly tachy, soft S3, lateral PMI, 2/6 HSM LLSB/apex.  Trace ankle edema.   1. Acute on chronic systolic CHF: Nonischemic cardiomyopathy based on cath 7/19 in [place]. Remote heavy ETOH, more recently about a 6 pack/week.  HIV negative.  No drugs.  Echo was done this admission with  biventricular failure. There is moderate to severe MR and severe TR with poor coaptation of TV leaflets.  On exam, he is markedly volume overloaded.  Low output HF indicated by low Co-ox of 43% initially, now on milrinone with co-ox up to 53%.  CVP 13.   - Increase milrinone to 0.375.  Repeat co-ox in pm.   - Will order cardiac MRI.   - Continue Lasix 80 mg IV bid today, replace K.   -  No beta blocker with low output.  - Continue low dose spironolactone and will add losartan 12.5 daily.  - Add digoxin 0.125 daily.  - Will eventually need RHC again this admission => plan for after full diuresis, possibly Friday.  - I suspect that he is nearing the need for advanced therapies.  We may not be able to successfully wean him off milrinone.  He has been noted to have cirrhosis on RUQ [name] and has significant RV dysfunction.  LVAD may not be a good option for him with RV dysfunction, will see what his RHC looks like after diuresis.  He will need evaluation in a transplant center.  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   2. OSA: CPAP qhs.  3. COPD: Will need eventual PFTs.  He no longer smokes.  4. 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 ?component of ETOH cirrhosis given prior heavy ETOH. RUQ US showed ascites with evidence for cirrhosis. NH3 with elevated at 64 on 9/3.  He is much less lethargic today.  - Send viral hepatitis labs.   CRITICAL CARE Performed by: Marca Ancona  Total critical care time: 35 minutes  Critical care time was exclusive of separately billable procedures and treating other patients.  Critical care was necessary to treat or prevent imminent or life-threatening deterioration.  Critical care was time spent personally by me on the following activities: development of treatment plan with patient and/or surrogate as well as nursing, discussions with consultants, evaluation of patient's response to treatment, examination of patient, obtaining  history from patient or surrogate, ordering and performing treatments and interventions, ordering and review of laboratory studies, ordering and review of radiographic studies, pulse oximetry and re-evaluation of patient's condition.   Marca Ancona 07/22/2018 8:25 AM

## 2018-07-22 NOTE — Plan of Care (Signed)
  Problem: Education: Goal: Knowledge of General Education information will improve Description: Including pain rating scale, medication(s)/side effects and non-pharmacologic comfort measures Outcome: Progressing   Problem: Health Behavior/Discharge Planning: Goal: Ability to manage health-related needs will improve Outcome: Progressing   Problem: Clinical Measurements: Goal: Ability to maintain clinical measurements within normal limits will improve Outcome: Progressing Goal: Will remain free from infection Outcome: Progressing Goal: Diagnostic test results will improve Outcome: Progressing Goal: Respiratory complications will improve Outcome: Progressing Goal: Cardiovascular complication will be avoided Outcome: Progressing   Problem: Activity: Goal: Risk for activity intolerance will decrease Outcome: Progressing   Problem: Nutrition: Goal: Adequate nutrition will be maintained Outcome: Progressing   Problem: Coping: Goal: Level of anxiety will decrease Outcome: Progressing   Problem: Elimination: Goal: Will not experience complications related to bowel motility Outcome: Progressing Goal: Will not experience complications related to urinary retention Outcome: Progressing   Problem: Pain Managment: Goal: General experience of comfort will improve Outcome: Progressing   Problem: Safety: Goal: Ability to remain free from injury will improve Outcome: Progressing   Problem: Skin Integrity: Goal: Risk for impaired skin integrity will decrease Outcome: Progressing   Problem: Education: Goal: Ability to demonstrate management of disease process will improve Outcome: Progressing   Problem: Activity: Goal: Capacity to carry out activities will improve Outcome: Progressing   Problem: Cardiac: Goal: Ability to achieve and maintain adequate cardiopulmonary perfusion will improve Outcome: Progressing   

## 2018-07-22 NOTE — Progress Notes (Signed)
ANTICOAGULATION CONSULT NOTE - Initial Consult  Pharmacy Consult for heparin Indication: DVT  No Known Allergies  Patient Measurements: Height: 5\' 10"  (177.8 cm) Weight: 146 lb 9.7 oz (66.5 kg) IBW/kg (Calculated) : 73 Heparin Dosing Weight: 67.7 kg  Vital Signs: Temp: 97.6 F (36.4 C) (09/04 1104) Temp Source: Oral (09/04 1104) BP: 104/84 (09/04 1104) Pulse Rate: 99 (09/04 1104)  Labs: Recent Labs    07/21/18 0540 07/21/18 1012 07/21/18 1447 07/21/18 2113 07/22/18 0437  HGB 13.1 14.1  --   --  12.7*  HCT 40.4 43.2  --   --  39.7  PLT 148* 160  --   --  152  LABPROT  --   --   --   --  17.2*  INR  --   --   --   --  1.41  CREATININE 1.15 1.10  --   --  1.13  TROPONINI  --  <0.03 <0.03 <0.03  --     Estimated Creatinine Clearance: 66.2 mL/min (by C-G formula based on SCr of 1.13 mg/dL).   Medical History: Past Medical History:  Diagnosis Date  . Cardiomyopathy, unspecified (HCC)   . CHF (congestive heart failure) (HCC)   . Chronic back pain   . Enlarged heart   . Gastric ulcer   . Gastroenteritis   . H/O degenerative disc disease     Medications:  Scheduled:  . aspirin EC  81 mg Oral Daily  . atorvastatin  10 mg Oral Daily  . digoxin  0.125 mg Oral Daily  . DULoxetine  60 mg Oral Daily  . enoxaparin (LOVENOX) injection  40 mg Subcutaneous Q24H  . furosemide  80 mg Intravenous BID  . gabapentin  600 mg Oral TID  . insulin aspart  0-9 Units Subcutaneous TID WC  . losartan  12.5 mg Oral Daily  . pantoprazole  40 mg Oral Daily  . potassium chloride  40 mEq Oral BID  . sodium chloride flush  10-40 mL Intracatheter Q12H  . sodium chloride flush  3 mL Intravenous Q12H  . spironolactone  12.5 mg Oral Daily  . thiamine  100 mg Oral Daily    Assessment: 57 yom with hx of HF s/p PICC line placed last night and started on milrinone. RLE venous duplex showing short segment of DVT peroneal vein.   Plan to start heparin until cath on Friday. Last dose of  enoxaparin for DVT ppx was on 9/3 at 1817. Hgb 12.7, plt 152. No s/sx of bleeding. No anticoag PTA. CTA showing no evidence of pulmonary embolism.    Goal of Therapy:  Heparin level 0.3-0.7 units/ml Monitor platelets by anticoagulation protocol: Yes   Plan:  Give 3350 units bolus x 1 Start heparin infusion at 1050 units/hr Check anti-Xa level in 6 hours and daily while on heparin Continue to monitor H&H and platelets  Girard Cooter, PharmD Clinical Pharmacist  Pager: [phone] Phone: [phone] 07/22/2018,3:49 PM

## 2018-07-22 NOTE — Progress Notes (Signed)
Patient noted to have decreased responsiveness when nursing student entered room to give medication. I was immediately notified and patient aroused to sternal rub, answered questions approriately but unable to stay awake. Sats and HR remained stable throughout on monitor. O2 placed on patient. Rapid Response/ Andy PA/ Dr. Shirlee Latch with CHF team notified and up to patients bedside. Orders received. Will transfer to [facility] after PICC placement. Report called to [facility] [id number].

## 2018-07-22 NOTE — Progress Notes (Signed)
  RLE [name] today with DVT.   Discussed with Dr. Shirlee Latch. Will cover with heparin until after RHC on Friday.    Casimiro Needle [address]" [place], PA-C 07/22/2018 3:53 PM

## 2018-07-22 NOTE — Care Management (Signed)
CM received consult regarding obtaining records from outside facility.  Unit secretary will request records from appropriate facility

## 2018-07-23 ENCOUNTER — Inpatient Hospital Stay (HOSPITAL_COMMUNITY): Payer: Medicare Other

## 2018-07-23 DIAGNOSIS — I429 Cardiomyopathy, unspecified: Secondary | ICD-10-CM

## 2018-07-23 DIAGNOSIS — G8929 Other chronic pain: Secondary | ICD-10-CM

## 2018-07-23 DIAGNOSIS — I82401 Acute embolism and thrombosis of unspecified deep veins of right lower extremity: Secondary | ICD-10-CM

## 2018-07-23 DIAGNOSIS — R570 Cardiogenic shock: Secondary | ICD-10-CM

## 2018-07-23 DIAGNOSIS — M545 Low back pain: Secondary | ICD-10-CM

## 2018-07-23 LAB — VITAMIN B12: VITAMIN B 12: 1126 pg/mL — AB (ref 180–914)

## 2018-07-23 LAB — GLUCOSE, CAPILLARY
Glucose-Capillary: 85 mg/dL (ref 70–99)
Glucose-Capillary: 127 mg/dL — ABNORMAL HIGH (ref 70–99)

## 2018-07-23 LAB — BASIC METABOLIC PANEL
Anion gap: 12 (ref 5–15)
BUN: 15 mg/dL (ref 6–20)
CHLORIDE: 101 mmol/L (ref 98–111)
CO2: 26 mmol/L (ref 22–32)
Calcium: 8.8 mg/dL — ABNORMAL LOW (ref 8.9–10.3)
Creatinine, Ser: 1.08 mg/dL (ref 0.61–1.24)
Glucose, Bld: 94 mg/dL (ref 70–99)
Potassium: 3.7 mmol/L (ref 3.5–5.1)
SODIUM: 139 mmol/L (ref 135–145)

## 2018-07-23 LAB — CBC
HCT: 36.9 % — ABNORMAL LOW (ref 39.0–52.0)
Hemoglobin: 12.2 g/dL — ABNORMAL LOW (ref 13.0–17.0)
MCH: 28.6 pg (ref 26.0–34.0)
MCHC: 33.1 g/dL (ref 30.0–36.0)
MCV: 86.6 fL (ref 78.0–100.0)
PLATELETS: 154 10*3/uL (ref 150–400)
RBC: 4.26 MIL/uL (ref 4.22–5.81)
RDW: 15.8 % — AB (ref 11.5–15.5)
WBC: 7.6 10*3/uL (ref 4.0–10.5)

## 2018-07-23 LAB — COOXEMETRY PANEL
Carboxyhemoglobin: 2 % — ABNORMAL HIGH (ref 0.5–1.5)
METHEMOGLOBIN: 1.5 % (ref 0.0–1.5)
O2 Saturation: 77.9 %
TOTAL HEMOGLOBIN: 12.1 g/dL (ref 12.0–16.0)

## 2018-07-23 LAB — HEPARIN LEVEL (UNFRACTIONATED)
HEPARIN UNFRACTIONATED: 0.91 [IU]/mL — AB (ref 0.30–0.70)
Heparin Unfractionated: 0.19 IU/mL — ABNORMAL LOW (ref 0.30–0.70)
Heparin Unfractionated: 0.33 IU/mL (ref 0.30–0.70)

## 2018-07-23 LAB — HCV AB W REFLEX TO QUANT PCR: HCV Ab: <0.1 s/co ratio (ref 0.0–0.9)

## 2018-07-23 LAB — HEPATITIS B SURFACE ANTIGEN: Hepatitis B Surface Ag: NEGATIVE

## 2018-07-23 LAB — HCV INTERPRETATION

## 2018-07-23 LAB — HEPATITIS B CORE ANTIBODY, IGM: Hep B C IgM: NEGATIVE

## 2018-07-23 LAB — FOLATE: Folate: 15.6 ng/mL (ref >5.9)

## 2018-07-23 LAB — HEPATITIS B SURFACE ANTIBODY, QUANTITATIVE

## 2018-07-23 LAB — MAGNESIUM: MAGNESIUM: 1.8 mg/dL (ref 1.7–2.4)

## 2018-07-23 MED ORDER — FUROSEMIDE 10 MG/ML IJ SOLN
80.0000 mg | Freq: Two times a day (BID) | INTRAMUSCULAR | Status: AC
  Administered 2018-07-23 (×2): 80 mg via INTRAVENOUS
  Filled 2018-07-23 (×2): qty 8

## 2018-07-23 MED ORDER — SODIUM CHLORIDE 0.9 % IV SOLN: 250.0000 mL | INTRAVENOUS | Status: DC | PRN

## 2018-07-23 MED ORDER — SODIUM CHLORIDE 0.9% FLUSH
3.0000 mL | Freq: Two times a day (BID) | INTRAVENOUS | Status: DC
  Administered 2018-07-23: 3 mL via INTRAVENOUS

## 2018-07-23 MED ORDER — SODIUM CHLORIDE 0.9 % IV SOLN
INTRAVENOUS | Status: DC
  Administered 2018-07-24: via INTRAVENOUS

## 2018-07-23 MED ORDER — SODIUM CHLORIDE 0.9% FLUSH
3.0000 mL | INTRAVENOUS | Status: DC | PRN
  Administered 2018-07-23: 3 mL via INTRAVENOUS
  Filled 2018-07-23: qty 3

## 2018-07-23 MED ORDER — HEPARIN BOLUS VIA INFUSION
2000.0000 [IU] | Freq: Once | INTRAVENOUS | Status: AC
  Administered 2018-07-23: 2000 [IU] via INTRAVENOUS
  Filled 2018-07-23: qty 2000

## 2018-07-23 MED ORDER — MAGNESIUM SULFATE 2 GM/50ML IV SOLN
2.0000 g | Freq: Once | INTRAVENOUS | Status: AC
  Administered 2018-07-23: 2 g via INTRAVENOUS
  Filled 2018-07-23: qty 50

## 2018-07-23 MED ORDER — LOSARTAN POTASSIUM 25 MG PO TABS
12.5000 mg | ORAL_TABLET | Freq: Two times a day (BID) | ORAL | Status: DC
  Administered 2018-07-23 – 2018-08-02 (×22): 12.5 mg via ORAL
  Filled 2018-07-23 (×22): qty 1

## 2018-07-23 MED ORDER — GADOBENATE DIMEGLUMINE 529 MG/ML IV SOLN
22.0000 mL | Freq: Once | INTRAVENOUS | Status: AC | PRN
  Administered 2018-07-23: 22 mL via INTRAVENOUS

## 2018-07-23 NOTE — Progress Notes (Signed)
Went to put patient on CPAP.  Patient said he put himself on and used it earlier but wanted to take it off for awhile. Patient stated he would put himself back on when he was ready to use again.

## 2018-07-23 NOTE — Progress Notes (Signed)
Patient ID: Theodore Welch, male   DOB: Aug 11, 1961, 57 y.o.   MRN: [id number]     Advanced Heart Failure Rounding Note  PCP-Cardiologist: No primary care provider on file.   Subjective:    PICC line placed 07/21/18. Initial Coox 43%. Started on milrinone 0.25 mcg/kg/min, increased to 0.375 with persistently low co-ox.  Co-ox today is improved at 78%. CVP 9-10 today. Good diuresis yesterday.    Feeling much better on milrinone. No dyspnea, has walked in room.   Venous US yesterday showed right peroneal vein DVT, now on heparin gtt.   Objective:   Weight Range: 66.5 kg Body mass index is 21.04 kg/m.   Vital Signs:   Temp:  [97.2 F (36.2 C)-98.7 F (37.1 C)] 98.7 F (37.1 C) (09/05 0400) Pulse Rate:  [85-103] 85 (09/05 0600) Resp:  [13-22] 13 (09/05 0600) BP: (86-106)/(67-86) 89/67 (09/05 0600) SpO2:  [95 %-100 %] 95 % (09/05 0600) Weight:  [66.5 kg] 66.5 kg (09/05 0508) Last BM Date: 07/22/18  Weight change: Filed Weights   07/21/18 1108 07/22/18 0500 07/23/18 0508  Weight: 67.7 kg 66.5 kg 66.5 kg    Intake/Output:   Intake/Output Summary (Last 24 hours) at 07/23/2018 0645 Last data filed at 07/23/2018 0509 Gross per 24 hour  Intake 1221.43 ml  Output 3125 ml  Net -1903.57 ml      Physical Exam    General: NAD Neck: JVP 10 cm, no thyromegaly or thyroid nodule.  Lungs: Clear to auscultation bilaterally with normal respiratory effort. CV: Lateral PMI.  Heart regular S1/S2, no S3/S4, 2/6 HSM apex/LLSB.  No peripheral edema.   Abdomen: Soft, nontender, no hepatosplenomegaly, mild distention.  Skin: Intact without lesions or rashes.  Neurologic: Alert and oriented x 3.  Psych: Normal affect. Extremities: No clubbing or cyanosis.  HEENT: Normal.    Telemetry   NSR 80-90s, personally reviewed  EKG    No new tracings.    Labs    CBC Recent Labs    07/21/18 0540  07/22/18 0437 07/23/18 0225  WBC 9.0   < > 6.3 7.6  NEUTROABS 5.8  --   --   --   HGB 13.1    < > 12.7* 12.2*  HCT 40.4   < > 39.7 36.9*  MCV 87.8   < > 87.8 86.6  PLT 148*   < > 152 154   < > = values in this interval not displayed.   Basic Metabolic Panel Recent Labs    56/21/30 0437 07/23/18 0225  NA 143 139  K 3.2* 3.7  CL 106 101  CO2 28 26  GLUCOSE 104* 94  BUN 17 15  CREATININE 1.13 1.08  CALCIUM 9.3 8.8*  MG  --  1.8   Liver Function Tests Recent Labs    07/21/18 1012 07/22/18 0437  AST 23 20  ALT 15 13  ALKPHOS 126 110  BILITOT 3.4* 3.5*  PROT 7.5 6.0*  ALBUMIN 3.9 3.1*   No results for input(s): LIPASE, AMYLASE in the last 72 hours. Cardiac Enzymes Recent Labs    07/21/18 1012 07/21/18 1447 07/21/18 2113  TROPONINI <0.03 <0.03 <0.03    BNP: BNP (last 3 results) Recent Labs    07/21/18 0540  BNP 1,343.9*    ProBNP (last 3 results) No results for input(s): PROBNP in the last 8760 hours.   D-Dimer No results for input(s): DDIMER in the last 72 hours. Hemoglobin A1C Recent Labs    07/21/18 1012  HGBA1C  6.5*   Fasting Lipid Panel Recent Labs    07/21/18 1012  CHOL 97  HDL 32*  LDLCALC 57  TRIG 39  CHOLHDL 3.0   Thyroid Function Tests No results for input(s): TSH, T4TOTAL, T3FREE, THYROIDAB in the last 72 hours.  Invalid input(s): FREET3  Other results:   Imaging    No results found.   Medications:     Scheduled Medications: . aspirin EC  81 mg Oral Daily  . atorvastatin  10 mg Oral Daily  . digoxin  0.125 mg Oral Daily  . DULoxetine  60 mg Oral Daily  . furosemide  80 mg Intravenous BID  . gabapentin  600 mg Oral TID  . insulin aspart  0-9 Units Subcutaneous TID WC  . losartan  12.5 mg Oral BID  . pantoprazole  40 mg Oral Daily  . potassium chloride  40 mEq Oral BID  . sodium chloride flush  10-40 mL Intracatheter Q12H  . sodium chloride flush  3 mL Intravenous Q12H  . spironolactone  12.5 mg Oral Daily  . thiamine  100 mg Oral Daily    Infusions: . heparin 1,250 Units/hr (07/23/18 0330)  .  milrinone 0.375 mcg/kg/min (07/23/18 0037)    PRN Medications: acetaminophen **OR** acetaminophen, hydrOXYzine, ipratropium-albuterol, methocarbamol, nicotine, senna-docusate, sodium chloride flush    Patient Profile   CINQUE RIAL is a 57 y.o. 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  Admitted with worsening SOB and orthopnea in setting of severe biventricular CHF.   Assessment/Plan   1. Acute on chronic systolic CHF: Nonischemic cardiomyopathy based on cath 7/19 in [place]. Remote heavy ETOH, more recently about a 6 pack/week.  HIV negative.  No drugs.  Echo was done this admission with biventricular failure, LV EF 10-20% with dilated RV. There is moderate to severe MR and severe TR with poor coaptation of TV leaflets.  On exam, he was markedly volume overloaded initially.  Low output HF indicated by low Co-ox of 43% initially, now on milrinone 0.375 with co-ox up to 78%.  CVP down to 9-10 this morning, good diuresis again yesterday.   - Continue milrinone 0.375 today.    - Awaiting cardiac MRI.   - Continue Lasix 80 mg IV bid today, probably to po tomorrow. Replace K.   - No beta blocker with low output.  - Continue low dose spironolactone and will increase losartan to 12.5 mg bid.  - Continue digoxin 0.125 daily.  - RHC tomorrow after full diuresis.  We discussed risks/benefits of procedure and he agrees.  - I suspect that he is nearing the need for advanced therapies.  We may not be able to successfully wean him off milrinone.  He has been noted to have cirrhosis on RUQ [name] and has significant RV dysfunction.  LVAD may not be a good option for him with RV dysfunction, will see what his RHC looks like after diuresis.  He will need evaluation in a transplant center.  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   2. OSA: CPAP qhs.  3. COPD: Will need eventual PFTs.  He no longer smokes.  4. Cirrhosis:  Suspect congestive hepatopathy due to RV failure. ?component of ETOH cirrhosis given prior heavy ETOH. RUQ US showed ascites with evidence for cirrhosis. NH3 with elevated at 64 on 9/3.  He is much less lethargic today than initially.  - Send viral hepatitis labs => pending.  5. RLE DVT: He has a right peroneal vein (below knee) DVT.  Even though below knee, think he is a relatively high risk patient with severe cardiomyopathy.  Would favor 6 months anticoagulation.  He is on heparin gtt for now, switch to DOAC after RHC tomorrow.   CRITICAL CARE Performed by: Marca Ancona  Total critical care time: 35 minutes  Critical care time was exclusive of separately billable procedures and treating other patients.  Critical care was necessary to treat or prevent imminent or life-threatening deterioration.  Critical care was time spent personally by me on the following activities: development of treatment plan with patient and/or surrogate as well as nursing, discussions with consultants, evaluation of patient's response to treatment, examination of patient, obtaining history from patient or surrogate, ordering and performing treatments and interventions, ordering and review of laboratory studies, ordering and review of radiographic studies, pulse oximetry and re-evaluation of patient's condition.   Marca Ancona 07/23/2018 6:45 AM

## 2018-07-23 NOTE — Progress Notes (Signed)
ANTICOAGULATION CONSULT NOTE  Pharmacy Consult for heparin Indication: DVT  No Known Allergies  Patient Measurements: Height: 5\' 10"  (177.8 cm) Weight: 146 lb 9.7 oz (66.5 kg) IBW/kg (Calculated) : 73 Heparin Dosing Weight: 67.7 kg  Vital Signs: Temp: 97.7 F (36.5 C) (09/05 1148) Temp Source: Oral (09/05 1148) BP: 86/75 (09/05 1148) Pulse Rate: 97 (09/05 1148)  Labs: Recent Labs    07/21/18 1012 07/21/18 1447 07/21/18 2113 07/22/18 0437 07/23/18 0225 07/23/18 1203  HGB 14.1  --   --  12.7* 12.2*  --   HCT 43.2  --   --  39.7 36.9*  --   PLT 160  --   --  152 154  --   LABPROT  --   --   --  17.2*  --   --   INR  --   --   --  1.41  --   --   HEPARINUNFRC  --   --   --   --  0.19* 0.91*  CREATININE 1.10  --   --  1.13 1.08  --   TROPONINI <0.03 <0.03 <0.03  --   --   --     Estimated Creatinine Clearance: 69.3 mL/min (by C-G formula based on SCr of 1.08 mg/dL).   Assessment: 57 y.o. male with RLE DVT for heparin. No anticoagulation PTA. Heparin is infusing in central line; level was paused for 10 minutes and draw from same line as per nursing policy. Initial level was subtherapeutic at 0.19 - now supratherapeutic at 0.91, on 1250 units/hr.  When drawn from peripheral location, level came back therapeutic at 0.33. No s/sx of bleeding. No infusion issues. Hgb 12.2, plt 154.    Goal of Therapy:  Heparin level 0.3-0.7 units/ml Monitor platelets by anticoagulation protocol: Yes   Plan:  Increase heparin infusion slightly to 1300 units/hr to keep in goal range Monitor daily HL, CBC, s/sx of bleeding  Girard Cooter, PharmD Clinical Pharmacist  Pager: [phone] Phone: [phone] 07/23/2018,1:12 PM

## 2018-07-23 NOTE — Progress Notes (Signed)
ANTICOAGULATION CONSULT NOTE  Pharmacy Consult for heparin Indication: DVT  No Known Allergies  Patient Measurements: Height: 5\' 10"  (177.8 cm) Weight: 146 lb 9.7 oz (66.5 kg) IBW/kg (Calculated) : 73 Heparin Dosing Weight: 67.7 kg  Vital Signs: Temp: 97.2 F (36.2 C) (09/05 0000) Temp Source: Axillary (09/05 0000) BP: 101/76 (09/05 0028) Pulse Rate: 94 (09/05 0028)  Labs: Recent Labs    07/21/18 0540 07/21/18 1012 07/21/18 1447 07/21/18 2113 07/22/18 0437 07/23/18 0225  HGB 13.1 14.1  --   --  12.7* 12.2*  HCT 40.4 43.2  --   --  39.7 36.9*  PLT 148* 160  --   --  152 154  LABPROT  --   --   --   --  17.2*  --   INR  --   --   --   --  1.41  --   HEPARINUNFRC  --   --   --   --   --  0.19*  CREATININE 1.15 1.10  --   --  1.13  --   TROPONINI  --  <0.03 <0.03 <0.03  --   --     Estimated Creatinine Clearance: 66.2 mL/min (by C-G formula based on SCr of 1.13 mg/dL).   Assessment: 57 y.o. male with RLE DVT for heparin  Goal of Therapy:  Heparin level 0.3-0.7 units/ml Monitor platelets by anticoagulation protocol: Yes   Plan:  Heparin 2000 units IV bolus, then increase heparin 1250 units/hr Check heparin level in 6 hours.   Geannie Risen, PharmD, BCPS  07/23/2018,3:08 AM

## 2018-07-23 NOTE — Progress Notes (Signed)
  Date: 07/23/2018  Patient name: Theodore Welch  Medical record number: [id number]  Date of birth: [birth year]   I have seen and evaluated this patient and I have discussed the plan of care with the house staff. Please see Dr. Neita Garnet note for complete details. I concur with her findings.  Inez Catalina, MD 07/23/2018, 4:39 PM

## 2018-07-23 NOTE — Progress Notes (Addendum)
Subjective:  Theodore Welch was started on heparin gtt for right peroneal vein DVT yesterday.   He is alert and sitting on the side of his bed this morning. He has not tried lying flat, but reports feeling good when he walked around his room earlier.   Objective:  Vital signs in last 24 hours: Vitals:   07/23/18 0508 07/23/18 0600 07/23/18 0643 07/23/18 1148  BP:  (!) 89/67 97/74 (!) 86/75  Pulse:  85 92 97  Resp:  13 (!) 35 19  Temp:   97.7 F (36.5 C) 97.7 F (36.5 C)  TempSrc:   Oral Oral  SpO2:  95% 98% 100%  Weight: 66.5 kg     Height:       Weight change: -1.222 kg  Intake/Output Summary (Last 24 hours) at 07/23/2018 1206 Last data filed at 07/23/2018 1100 Gross per 24 hour  Intake 1101.43 ml  Output 1575 ml  Net -473.57 ml   Physical exam Gen: Alert, NAD, sitting on edge of bed CV: regular rate, rhythm .  Pulm: RRR, nl S1 S2 MS: no LE edema.   Assessment/Plan:  Principal Problem:   CHF (congestive heart failure) (HCC) Active Problems:   Tobacco use   Congestive heart failure (HCC)   Type 2 diabetes mellitus (HCC)   Hyperlipidemia   Ascites   Theodore Welch is a 57yo M with DM2,HTN, HLD, COPD, who presents with worsening of severe biventricular heart failure.  Severe biventricular CHF 2/2 NICM Echo on 07/21/18 shows LVEF 10-20%. Patient presented with PND,orthopnea, and LE edema that has worsened over the past 2-3 weeks. PICC line placed. He is feeling better with diuresis and new medications. Net 4L since admission.  Co-ox improving, currently 78% .  Will have cardiac c-MRI when patient can tolerate lying flat. Plan for [place] on 9/6 for further evaluation and planning of advanced therapies for CHF.    - following cardiology-heart failure recommendations  - IV Lasix '80mg'$  BID. Transition to PO tomorrow pending clinical improvement - Continue spirolactone 12.'5mg'$  qd, ASA, Statin - Cardiology managing milrinone. Will coordinate plan for continuation with  cardiology.  - Increase losartan 12.'5mg'$  to BID - Continue digoxin 0.125 - NPO at midnight  RLE DVT - Continue heparin gtt - switch to DOAC after RHC tomorrow  Cirrhosis  On admission total Billi 3.4 , Direct 1.1 / Albumin 3.9 / INR 1.4. Nl AST/ALT/Alk P. Mild thrombocytopenia on admission.  RUQ [name] 9/3 shows ascites and cirrhotic changes. Hepatitis panel neg.  Neg hep B antibody - will need immunization of hep B in future  DM2 CBGs controlled.  Hgb A1c 6.5%. Holding home Metformin 500 mg BID.  - HH/Carb mod diet - Sensitive sling-scale insulin  HLD : LDL 57 HDL 32 on 9/4 -continue home atorvostatin '10mg'$   Anxiety - Continue home Cymbalta (also for chronic low back pain) - Continue home PRN Hydroxyzine '25mg'$  TID   COPD : From record, PFT's 5 years ago at [facility] . Schedule PFT's pending clinical improvement  Tobacco use  -nicotine patch PRN  Dispo: Pending further diuresis , workup , and planning of advance therapy options for biventricular CHF. Patient on milrinone; Will need planning to go home or to skilled nursing facility.   Diet: HH/Carb mod DVT PPx: Lovenox IVF: None Code: Full    LOS: 2 days   Lyndal Pulley, Medical Student 07/23/2018, 12:06 PM   Attestation for Student Documentation:  I personally was present and performed or re-performed the history, physical  exam and medical decision-making activities of this service and have verified that the service and findings are accurately documented in the student's note.  Kalman Shan Beclabito, DO 07/23/2018, 3:13 PM

## 2018-07-23 NOTE — Care Management (Addendum)
Pt is alert and oriented, pt is independent from home with wife.  CM informed pt may discharge home on milrinone.  Pt informed CM that he is wanting to use his medicare benefit for his discharge needs including milrinone, pt also informed CM that he has active medicaid.  [facility] follow for discharge needs  [facility] informed [facility] of admit.  Pts PCP is at [facility] Dr. Marko Stai.  [facility] social worker is Alger Simons [phone] ext [phone]).  No request from [place] at this time

## 2018-07-24 ENCOUNTER — Encounter (HOSPITAL_COMMUNITY)
Admission: EM | Disposition: A | Discharge status: Home / Self Care | Payer: Self-pay | Source: Home / Self Care | Attending: Internal Medicine

## 2018-07-24 ENCOUNTER — Inpatient Hospital Stay (HOSPITAL_COMMUNITY): Payer: Medicare Other

## 2018-07-24 DIAGNOSIS — I5023 Acute on chronic systolic (congestive) heart failure: Secondary | ICD-10-CM

## 2018-07-24 HISTORY — PX: RIGHT HEART CATH: CATH118263

## 2018-07-24 LAB — BASIC METABOLIC PANEL
Anion gap: 9 (ref 5–15)
BUN: 19 mg/dL (ref 6–20)
CHLORIDE: 101 mmol/L (ref 98–111)
CO2: 25 mmol/L (ref 22–32)
Calcium: 8.8 mg/dL — ABNORMAL LOW (ref 8.9–10.3)
Creatinine, Ser: 1.02 mg/dL (ref 0.61–1.24)
GFR calc Af Amer: >60 mL/min (ref >60)
GFR calc non Af Amer: >60 mL/min (ref >60)
GLUCOSE: 95 mg/dL (ref 70–99)
POTASSIUM: 4 mmol/L (ref 3.5–5.1)
SODIUM: 135 mmol/L (ref 135–145)

## 2018-07-24 LAB — APTT: aPTT: 37 seconds — ABNORMAL HIGH (ref 24–36)

## 2018-07-24 LAB — URINALYSIS, ROUTINE W REFLEX MICROSCOPIC
BILIRUBIN URINE: NEGATIVE
Glucose, UA: NEGATIVE mg/dL
HGB URINE DIPSTICK: NEGATIVE
KETONES UR: NEGATIVE mg/dL
Leukocytes, UA: NEGATIVE
Nitrite: NEGATIVE
PH: 6 (ref 5.0–8.0)
Protein, ur: NEGATIVE mg/dL
Specific Gravity, Urine: 1.02 (ref 1.005–1.030)

## 2018-07-24 LAB — POCT I-STAT 3, VENOUS BLOOD GAS (G3P V)
ACID-BASE EXCESS: 1 mmol/L (ref 0.0–2.0)
BICARBONATE: 25.1 mmol/L (ref 20.0–28.0)
BICARBONATE: 25.8 mmol/L (ref 20.0–28.0)
O2 Saturation: 69 %
O2 Saturation: 70 %
PH VEN: 7.378 (ref 7.250–7.430)
TCO2: 26 mmol/L (ref 22–32)
TCO2: 27 mmol/L (ref 22–32)
pCO2, Ven: 42.7 mmHg — ABNORMAL LOW (ref 44.0–60.0)
pCO2, Ven: 43 mmHg — ABNORMAL LOW (ref 44.0–60.0)
pH, Ven: 7.387 (ref 7.250–7.430)
pO2, Ven: 37 mmHg (ref 32.0–45.0)
pO2, Ven: 38 mmHg (ref 32.0–45.0)

## 2018-07-24 LAB — HEPATITIS PANEL, ACUTE
HCV Ab: <0.1 s/co ratio (ref 0.0–0.9)
HEP B S AG: NEGATIVE
Hep A IgM: NEGATIVE
Hep B C IgM: NEGATIVE

## 2018-07-24 LAB — CBC
HCT: 38.2 % — ABNORMAL LOW (ref 39.0–52.0)
HEMATOCRIT: 39.1 % (ref 39.0–52.0)
Hemoglobin: 12.3 g/dL — ABNORMAL LOW (ref 13.0–17.0)
Hemoglobin: 12.7 g/dL — ABNORMAL LOW (ref 13.0–17.0)
MCH: 27.8 pg (ref 26.0–34.0)
MCH: 28.4 pg (ref 26.0–34.0)
MCHC: 32.2 g/dL (ref 30.0–36.0)
MCHC: 32.5 g/dL (ref 30.0–36.0)
MCV: 86.4 fL (ref 78.0–100.0)
MCV: 87.5 fL (ref 78.0–100.0)
Platelets: 152 10*3/uL (ref 150–400)
Platelets: 162 10*3/uL (ref 150–400)
RBC: 4.42 MIL/uL (ref 4.22–5.81)
RBC: 4.47 MIL/uL (ref 4.22–5.81)
RDW: 15.4 % (ref 11.5–15.5)
RDW: 15.7 % — ABNORMAL HIGH (ref 11.5–15.5)
WBC: 6.9 10*3/uL (ref 4.0–10.5)
WBC: 6.1 10*3/uL (ref 4.0–10.5)

## 2018-07-24 LAB — COOXEMETRY PANEL
CARBOXYHEMOGLOBIN: 1.7 % — AB (ref 0.5–1.5)
METHEMOGLOBIN: 0.7 % (ref 0.0–1.5)
O2 Saturation: 61.1 %
Total hemoglobin: 11.9 g/dL — ABNORMAL LOW (ref 12.0–16.0)

## 2018-07-24 LAB — ANTITHROMBIN III: AntiThromb III Func: 67 % — ABNORMAL LOW (ref 75–120)

## 2018-07-24 LAB — T4, FREE: Free T4: 1.15 ng/dL (ref 0.82–1.77)

## 2018-07-24 LAB — PROTIME-INR
INR: 1.24
PROTHROMBIN TIME: 15.5 s — AB (ref 11.4–15.2)

## 2018-07-24 LAB — URIC ACID: URIC ACID, SERUM: 9.4 mg/dL — AB (ref 3.7–8.6)

## 2018-07-24 LAB — TYPE AND SCREEN
ABO/RH(D): O POS
ANTIBODY SCREEN: NEGATIVE

## 2018-07-24 LAB — GLUCOSE, CAPILLARY
GLUCOSE-CAPILLARY: 92 mg/dL (ref 70–99)
Glucose-Capillary: 84 mg/dL (ref 70–99)
Glucose-Capillary: 196 mg/dL — ABNORMAL HIGH (ref 70–99)
Glucose-Capillary: 97 mg/dL (ref 70–99)

## 2018-07-24 LAB — CREATININE, SERUM
Creatinine, Ser: 0.95 mg/dL (ref 0.61–1.24)
GFR calc Af Amer: >60 mL/min (ref >60)
GFR calc non Af Amer: >60 mL/min (ref >60)

## 2018-07-24 LAB — MRSA PCR SCREENING: MRSA by PCR: NEGATIVE

## 2018-07-24 LAB — ABO/RH: ABO/RH(D): O POS

## 2018-07-24 LAB — POCT ACTIVATED CLOTTING TIME: ACTIVATED CLOTTING TIME: 114 s

## 2018-07-24 LAB — PREALBUMIN: Prealbumin: 10.7 mg/dL — ABNORMAL LOW (ref 18–38)

## 2018-07-24 LAB — TSH: TSH: 0.88 u[IU]/mL (ref 0.350–4.500)

## 2018-07-24 LAB — LACTATE DEHYDROGENASE: LDH: 180 U/L (ref 98–192)

## 2018-07-24 LAB — HEPARIN LEVEL (UNFRACTIONATED)
HEPARIN UNFRACTIONATED: 0.34 [IU]/mL (ref 0.30–0.70)
Heparin Unfractionated: 0.28 IU/mL — ABNORMAL LOW (ref 0.30–0.70)

## 2018-07-24 SURGERY — RIGHT HEART CATH
Anesthesia: LOCAL

## 2018-07-24 MED ORDER — IOPAMIDOL (ISOVUE-300) INJECTION 61%
100.0000 mL | Freq: Once | INTRAVENOUS | Status: AC | PRN
  Administered 2018-07-24: 100 mL via INTRAVENOUS

## 2018-07-24 MED ORDER — IOPAMIDOL (ISOVUE-300) INJECTION 61%
INTRAVENOUS | Status: AC
  Filled 2018-07-24: qty 100

## 2018-07-24 MED ORDER — APIXABAN 5 MG PO TABS: 5.0000 mg | ORAL_TABLET | Freq: Two times a day (BID) | ORAL | Status: DC

## 2018-07-24 MED ORDER — ONDANSETRON HCL 4 MG/2ML IJ SOLN: 4.0000 mg | Freq: Four times a day (QID) | INTRAMUSCULAR | Status: DC | PRN

## 2018-07-24 MED ORDER — SODIUM CHLORIDE 0.9% FLUSH
3.0000 mL | Freq: Two times a day (BID) | INTRAVENOUS | Status: DC
  Administered 2018-07-25 – 2018-08-02 (×5): 3 mL via INTRAVENOUS

## 2018-07-24 MED ORDER — MIDAZOLAM HCL 2 MG/2ML IJ SOLN
INTRAMUSCULAR | Status: AC
  Filled 2018-07-24: qty 2

## 2018-07-24 MED ORDER — FENTANYL CITRATE (PF) 100 MCG/2ML IJ SOLN
INTRAMUSCULAR | Status: DC | PRN
  Administered 2018-07-24 (×2): 25 ug via INTRAVENOUS

## 2018-07-24 MED ORDER — MIDAZOLAM HCL 2 MG/2ML IJ SOLN
INTRAMUSCULAR | Status: DC | PRN
  Administered 2018-07-24: 1 mg via INTRAVENOUS

## 2018-07-24 MED ORDER — SPIRONOLACTONE 25 MG PO TABS
25.0000 mg | ORAL_TABLET | Freq: Every day | ORAL | Status: DC
  Administered 2018-07-24 – 2018-08-02 (×10): 25 mg via ORAL
  Filled 2018-07-24 (×10): qty 1

## 2018-07-24 MED ORDER — FUROSEMIDE 10 MG/ML IJ SOLN
80.0000 mg | Freq: Two times a day (BID) | INTRAMUSCULAR | Status: AC
  Administered 2018-07-24: 80 mg via INTRAVENOUS
  Filled 2018-07-24 (×2): qty 8

## 2018-07-24 MED ORDER — HEPARIN (PORCINE) IN NACL 1000-0.9 UT/500ML-% IV SOLN
INTRAVENOUS | Status: DC | PRN
  Administered 2018-07-24: 500 mL

## 2018-07-24 MED ORDER — LIDOCAINE HCL (PF) 1 % IJ SOLN
INTRAMUSCULAR | Status: AC
  Filled 2018-07-24: qty 30

## 2018-07-24 MED ORDER — HEPARIN (PORCINE) IN NACL 1000-0.9 UT/500ML-% IV SOLN
INTRAVENOUS | Status: AC
  Filled 2018-07-24: qty 500

## 2018-07-24 MED ORDER — APIXABAN 5 MG PO TABS: 10.0000 mg | ORAL_TABLET | Freq: Two times a day (BID) | ORAL | Status: DC

## 2018-07-24 MED ORDER — SODIUM CHLORIDE 0.9 % IV SOLN: 250.0000 mL | INTRAVENOUS | Status: DC | PRN

## 2018-07-24 MED ORDER — LIDOCAINE HCL (PF) 1 % IJ SOLN
INTRAMUSCULAR | Status: DC | PRN
  Administered 2018-07-24: 15 mL

## 2018-07-24 MED ORDER — HEPARIN SODIUM (PORCINE) 5000 UNIT/ML IJ SOLN: 5000.0000 [IU] | Freq: Three times a day (TID) | INTRAMUSCULAR | Status: DC

## 2018-07-24 MED ORDER — SODIUM CHLORIDE 0.9% FLUSH: 3.0000 mL | INTRAVENOUS | Status: DC | PRN

## 2018-07-24 MED ORDER — ACETAMINOPHEN 325 MG PO TABS: 650.0000 mg | ORAL_TABLET | ORAL | Status: DC | PRN

## 2018-07-24 MED ORDER — FENTANYL CITRATE (PF) 100 MCG/2ML IJ SOLN
INTRAMUSCULAR | Status: AC
  Filled 2018-07-24: qty 2

## 2018-07-24 MED ORDER — HEPARIN (PORCINE) IN NACL 100-0.45 UNIT/ML-% IJ SOLN
1400.0000 [IU]/h | INTRAMUSCULAR | Status: DC
  Administered 2018-07-24: 1350 [IU]/h via INTRAVENOUS
  Administered 2018-07-26 – 2018-07-29 (×5): 1400 [IU]/h via INTRAVENOUS
  Filled 2018-07-24 (×6): qty 250

## 2018-07-24 MED ORDER — POTASSIUM CHLORIDE CRYS ER 20 MEQ PO TBCR
20.0000 meq | EXTENDED_RELEASE_TABLET | Freq: Once | ORAL | Status: AC
  Administered 2018-07-24: 20 meq via ORAL
  Filled 2018-07-24: qty 1

## 2018-07-24 SURGICAL SUPPLY — 9 items
CATH SWAN GANZ 7F STRAIGHT (CATHETERS) ×2 IMPLANT
PACK CARDIAC CATHETERIZATION (CUSTOM PROCEDURE TRAY) ×2 IMPLANT
PROTECTION STATION PRESSURIZED (MISCELLANEOUS) ×2
SHEATH PINNACLE 7F 10CM (SHEATH) ×2 IMPLANT
STATION PROTECTION PRESSURIZED (MISCELLANEOUS) ×1 IMPLANT
TRANSDUCER W/STOPCOCK (MISCELLANEOUS) ×2 IMPLANT
TUBING ART PRESS 72  MALE/FEM (TUBING) ×1
TUBING ART PRESS 72 MALE/FEM (TUBING) ×1 IMPLANT
WIRE EMERALD 3MM-J .025X260CM (WIRE) ×2 IMPLANT

## 2018-07-24 NOTE — Progress Notes (Addendum)
Subjective:  Theodore Welch went for c-MRI yesterday. He is scheduled for RHC today.  He reports doing well overnight. Discussed alcohol use and importance of abstinence. Diuresed approx 3.5 L yesterday, net negative 7.5 liters since admission.   Objective:  Vital signs in last 24 hours: Vitals:   07/23/18 1923 07/23/18 2358 07/24/18 0030 07/24/18 0434  BP: 94/84 (!) 86/72 (!) 88/76 95/74  Pulse: 98 92  91  Resp: (!) 21 14  14   Temp: 97.6 F (36.4 C) 97.7 F (36.5 C)  97.7 F (36.5 C)  TempSrc: Oral Oral  Oral  SpO2: 100% 100%  97%  Weight:    66.7 kg  Height:       Weight change: 0.2 kg  Intake/Output Summary (Last 24 hours) at 07/24/2018 2482 Last data filed at 07/24/2018 0430 Gross per 24 hour  Intake 680.45 ml  Output 4100 ml  Net -3419.55 ml   Physical Exam Gen: A/O x4, waking up in a pleasant mood, afebrile, nondiaphoretic  CV: RRR, no mrg's Pulm: CTAB, absent  MS: no LE edema  Assessment/Plan:  Principal Problem:   CHF (congestive heart failure) (HCC) Active Problems:   Tobacco use   Congestive heart failure (HCC)   Type 2 diabetes mellitus (HCC)   Hyperlipidemia   Ascites   Theodore Welch is a 57yo M with DM2,HTN, HLD, COPD, who presents with worsening of severe biventricular heart failure.  Severe biventricular CHF 2/2 NICM  Patient presented with PND,orthopnea, and LE edema that has worsened over the past 2-3 weeks. He is feeling better with diuresis and new medications. Net negative 7L since admission.  Co-ox improved since admission.  C-MRI yesterday without signs of infiltrative disease. HF Cards believes patient will likely be milrinone dependent on discharge and need evaluation in transplant center. RHC today.  - following cardiology-heart failure recommendations  - Patient wearing Lifevest at home  - Continue digoxin 0.125. Milrinone, ASA, Statin, losartan 12.5mg  BID - IV Lasix 80mg  BID today Transition to PO lasix 100mg  BID tomorrow. Consider  adding torsemide.  - Increase spirolactone  to 25mg  qd  RLE DVT - stop heparin gtt - switch to Xarelto 15mg  BID for 21 days followed by 20mg  once daily. Patient is smoker , recent hospitalizations, and heart failure. Agree w/ cardiology recs for 6 months of therapy.    Cirrhosis  RUQ [name] 9/3 shows cirrhotic changes. Heavy alcohol use off and on for many years.  He has quit drinking for 4 months now. Hepatitis panel neg.  Neg hep B antibody - will need immunization of hep B in future  DM2 CBGs controlled.  Hgb A1c 6.5%. Holding home Metformin 500 mg BID.  - HH/Carb mod diet - Sensitive sling-scale insulin  HLD : LDL 57 HDL 32 on 9/4 -continue home atorvostatin 10mg   Anxiety - Continue home Cymbalta (also for chronic low back pain) - Continue home PRN Hydroxyzine 25mg  TID   COPD : From record, PFT's 5 years ago at [facility] . Schedule PFT's pending clinical improvement  Tobacco use : -nicotine patch PRN  Dispo: Pending further diuresis , workup , and planning of advance therapy options for biventricular CHF. Patient likely dependent on milrinone and will need discharge to transplant center.   Diet: HH/Carb mod DVT PPx: Lovenox IVF: None Code: Full   LOS: 3 days   Gardenia Phlegm, Medical Student 07/24/2018, 7:13 AM   Attestation for Student Documentation:  I personally was present and performed or re-performed the history, physical exam  and medical decision-making activities of this service and have verified that the service and findings are accurately documented in the student's note.  Lanelle Bal, MD 07/24/2018, 1:14 PM

## 2018-07-24 NOTE — Progress Notes (Addendum)
ANTICOAGULATION CONSULT NOTE  Pharmacy Consult for heparin Indication: DVT  No Known Allergies  Patient Measurements: Height: 5\' 10"  (177.8 cm) Weight: 145 lb 1 oz (65.8 kg) IBW/kg (Calculated) : 73 Heparin Dosing Weight: 67.7 kg  Vital Signs: Temp: 98.3 F (36.8 C) (09/06 1118) Temp Source: Oral (09/06 1118) BP: 98/78 (09/06 1118) Pulse Rate: 92 (09/06 1118)  Labs: Recent Labs    07/21/18 1447 07/21/18 2113  07/22/18 0437 07/23/18 0225  07/23/18 1355 07/24/18 0245 07/24/18 0937  HGB  --   --    < > 12.7* 12.2*  --   --  12.3*  --   HCT  --   --   --  39.7 36.9*  --   --  38.2*  --   PLT  --   --   --  152 154  --   --  152  --   LABPROT  --   --   --  17.2*  --   --   --   --   --   INR  --   --   --  1.41  --   --   --   --   --   HEPARINUNFRC  --   --   --   --  0.19*   < > 0.33 0.28* 0.34  CREATININE  --   --   --  1.13 1.08  --   --  1.02  --   TROPONINI <0.03 <0.03  --   --   --   --   --   --   --    < > = values in this interval not displayed.    Estimated Creatinine Clearance: 72.6 mL/min (by C-G formula based on SCr of 1.02 mg/dL).   Assessment: 57 y.o. male with RLE DVT for heparin. No anticoagulation PTA.   Heparin level is therapeutic at 0.34, on 1350 units/hr. Hgb 12.3, plt 152. No s/sx of bleeding. No infusion issues.   Goal of Therapy:  Heparin level 0.3-0.7 units/ml Monitor platelets by anticoagulation protocol: Yes   Plan:  Continue heparin infusion at 1350 units/hr  Monitor daily HL, CBC, s/sx of bleeding  Girard Cooter, PharmD Clinical Pharmacist  Pager: [phone] Phone: [phone] 07/24/2018,11:55 AM  ADDENDUM Underwent RHC today. Plan to resume apixaban 10 mg twice daily for 7 days then 5 mg twice daily starting tonight per conversation with Dr. Shirlee Latch. Scr 1.02 (CrCl 72 mL/min). No signs/symptoms of bleeding.  1. Discontinue heparin infusion 2. Start apixaban 10 mg twice daily tonight 3. Monitor renal function, CBC, and for s/sx of  bleeding 4. Will provide education prior to discharge.  Girard Cooter, PharmD Clinical Pharmacist

## 2018-07-24 NOTE — Care Management Important Message (Signed)
Important Message  Patient Details  Name: Theodore Welch MRN: [id number] Date of Birth: 1961/11/02   Medicare Important Message Given:  Yes    Theodore Welch Theodore Welch 07/24/2018, 3:18 PM

## 2018-07-24 NOTE — Interval H&P Note (Signed)
History and Physical Interval Note:  07/24/2018 1:25 PM  Theodore Welch  has presented today for surgery, with the diagnosis of hf  The various methods of treatment have been discussed with the patient and family. After consideration of risks, benefits and other options for treatment, the patient has consented to  Procedure(s): RIGHT HEART CATH (N/A) as a surgical intervention .  The patient's history has been reviewed, patient examined, no change in status, stable for surgery.  I have reviewed the patient's chart and labs.  Questions were answered to the patient's satisfaction.     Dalton [employer]

## 2018-07-24 NOTE — Progress Notes (Signed)
  Date: 07/24/2018  Patient name: Theodore Welch  Medical record number: [id number]  Date of birth: 1961/05/18   I have seen and evaluated this patient and I have discussed the plan of care with the house staff. Please see Dr. Crista Elliot and MS4 Steen's note for complete details. I concur with their findings with the following additions/corrections:   Per review of pharmacy notes, Dr. Shirlee Latch preferred Apixaban (Eliquis) and this was started with the assistance of pharmacy.  Xarelto would be a reasonable alternative which is listed in Resident Documentation.  Eliquis will be started after RHC today.    Inez Catalina, MD 07/24/2018, 4:02 PM

## 2018-07-24 NOTE — Progress Notes (Signed)
Notified Md about pt's bp 86/64 with meds to be given. Clarification to hold or to give medication. Will continue to monitor. Karena Addison T

## 2018-07-24 NOTE — Progress Notes (Signed)
Back from the cath lab awake and alert, right groin dressing dry and intact. Instructed not to bend right knee and to call for any signs of bleeding. Bed rest emphasized.

## 2018-07-24 NOTE — Discharge Instructions (Addendum)

## 2018-07-24 NOTE — Progress Notes (Addendum)
Patient ID: Theodore Welch, male   DOB: 03/09/1961, 57 y.o.   MRN: [id number]     Advanced Heart Failure Rounding Note  PCP-Cardiologist: No primary care provider on file.   Subjective:    PICC line placed 07/21/18. Initial Coox 43%. Started on milrinone 0.25 mcg/kg/min, increased to 0.375 with persistently low co-ox.    Coox 61.1% today on milrinone 0.375 mcg/kg/min. CVP 9-10 cm.  Good diuresis again yesterday.   Feeling good this am. Snoring heavily on my arrival to room but awakens easily. Denies SOB, orthopnea, or PND.   Venous US 07/22/18 showed right peroneal vein DVT, now on heparin gtt.   cMRI 07/23/18 1.  Moderate dilated LV with EF 14%, severe diffuse hypokinesis. 2. Severely dilated RV with moderate to severe systolic dysfunction, EF 25%. 3. There mid-wall LGE at the inferior RV insertion site. This is a nonspecific LGE pattern and can be seen with pressure/volume overload.   Objective:    Weight Range: 66.7 kg Body mass index is 21.1 kg/m.   Vital Signs:   Temp:  [97.6 F (36.4 C)-98.3 F (36.8 C)] 98.3 F (36.8 C) (09/06 0700) Pulse Rate:  [91-112] 96 (09/06 0700) Resp:  [14-21] 14 (09/06 0700) BP: (81-100)/(68-84) 100/80 (09/06 0700) SpO2:  [97 %-100 %] 99 % (09/06 0700) Weight:  [66.7 kg] 66.7 kg (09/06 0434) Last BM Date: 07/23/18  Weight change: Filed Weights   07/22/18 0500 07/23/18 0508 07/24/18 0434  Weight: 66.5 kg 66.5 kg 66.7 kg   Intake/Output:   Intake/Output Summary (Last 24 hours) at 07/24/2018 0840 Last data filed at 07/24/2018 0800 Gross per 24 hour  Intake 701.57 ml  Output 4600 ml  Net -3898.43 ml    Physical Exam    General: NAD  HEENT: Normal Neck: Supple. JVP 9-10 cm. Carotids 2+ bilat; no bruits. No thyromegaly or nodule noted. Cor: PMI nondisplaced. RRR, 2/6 HSM apex/LLSB.  Lungs: CTAB, normal effort. Abdomen: Soft, non-tender, non-distended, no HSM. No bruits or masses. +BS  Extremities: No cyanosis, clubbing, or rash. R  and LLE no edema.  Neuro: Alert & orientedx3, cranial nerves grossly intact. moves all 4 extremities w/o difficulty. Affect pleasant   Telemetry   NSR 80-90s, personally reviewed.   EKG    No new tracings.    Labs    CBC Recent Labs    07/23/18 0225 07/24/18 0245  WBC 7.6 6.9  HGB 12.2* 12.3*  HCT 36.9* 38.2*  MCV 86.6 86.4  PLT 154 152   Basic Metabolic Panel Recent Labs    07/23/18 0225 07/24/18 0245  NA 139 135  K 3.7 4.0  CL 101 101  CO2 26 25  GLUCOSE 94 95  BUN 15 19  CREATININE 1.08 1.02  CALCIUM 8.8* 8.8*  MG 1.8  --    Liver Function Tests Recent Labs    07/21/18 1012 07/22/18 0437  AST 23 20  ALT 15 13  ALKPHOS 126 110  BILITOT 3.4* 3.5*  PROT 7.5 6.0*  ALBUMIN 3.9 3.1*   No results for input(s): LIPASE, AMYLASE in the last 72 hours. Cardiac Enzymes Recent Labs    07/21/18 1012 07/21/18 1447 07/21/18 2113  TROPONINI <0.03 <0.03 <0.03    BNP: BNP (last 3 results) Recent Labs    07/21/18 0540  BNP 1,343.9*    ProBNP (last 3 results) No results for input(s): PROBNP in the last 8760 hours.   D-Dimer No results for input(s): DDIMER in the last 72 hours. Hemoglobin A1C   Recent Labs    07/21/18 1012  HGBA1C 6.5*   Fasting Lipid Panel Recent Labs    07/21/18 1012  CHOL 97  HDL 32*  LDLCALC 57  TRIG 39  CHOLHDL 3.0   Thyroid Function Tests No results for input(s): TSH, T4TOTAL, T3FREE, THYROIDAB in the last 72 hours.  Invalid input(s): FREET3  Other results:   Imaging    Mr Cardiac Morphology W Wo Contrast  Result Date: 07/23/2018 CLINICAL DATA:  Cardiomyopathy of uncertain etiology EXAM: CARDIAC MRI TECHNIQUE: The patient was scanned on a 1.5 Tesla GE magnet. A dedicated cardiac coil was used. Functional imaging was done using Fiesta sequences. 2,3, and 4 chamber views were done to assess for RWMA's. Modified Simpson's rule using a short axis stack was used to calculate an ejection fraction on a dedicated work  station using Circle software. The patient received 28 cc of Multihance. After 10 minutes inversion recovery sequences were used to assess for infiltration and scar tissue. CONTRAST:  28 cc Multihance FINDINGS: Limited images of the lung fields with no gross abnormalities. There was a small circumferential pericardial effusion. Moderately dilated left ventricle with normal wall thickness. Severe diffuse hypokinesis, LV EF 14%. No LV thrombus noted. Severe RV dilation and moderate to severe systolic dysfunction, EF 25%. Moderate to severe biatrial enlargement. Severe tricuspid regurgitation. Moderate to severe mitral regurgitation (flow sequences to quantify mitral regurgitation were not done). Trileaflet aortic valve without significant stenosis or regurgitation. On delayed enhancement imaging, there was mid-wall late gadolinium enhancement (LGE) in the basal to mid inferoseptal wall RV at the inferior RV insertion site. Measurements: LVEDV 342 mL LVSV 48 mL LVEF 14% RVEDV 412 mL RVSV 102 mL RVEF 25% IMPRESSION: 1.  Moderate dilated LV with EF 14%, severe diffuse hypokinesis. 2. Severely dilated RV with moderate to severe systolic dysfunction, EF 25%. 3. There mid-wall LGE at the inferior RV insertion site. This is a nonspecific LGE pattern and can be seen with pressure/volume overload. Toure Edmonds Electronically Signed   By: Quentyn Kolbeck M.D.   On: 07/23/2018 22:18     Medications:     Scheduled Medications: . aspirin EC  81 mg Oral Daily  . atorvastatin  10 mg Oral Daily  . digoxin  0.125 mg Oral Daily  . DULoxetine  60 mg Oral Daily  . gabapentin  600 mg Oral TID  . insulin aspart  0-9 Units Subcutaneous TID WC  . losartan  12.5 mg Oral BID  . pantoprazole  40 mg Oral Daily  . sodium chloride flush  10-40 mL Intracatheter Q12H  . sodium chloride flush  3 mL Intravenous Q12H  . sodium chloride flush  3 mL Intravenous Q12H  . spironolactone  25 mg Oral Daily  . thiamine  100 mg Oral Daily     Infusions: . sodium chloride    . sodium chloride 10 mL/hr at 07/24/18 0027  . heparin 1,350 Units/hr (07/24/18 0402)  . milrinone 0.375 mcg/kg/min (07/24/18 0147)    PRN Medications: sodium chloride, acetaminophen **OR** acetaminophen, hydrOXYzine, ipratropium-albuterol, methocarbamol, nicotine, senna-docusate, sodium chloride flush, sodium chloride flush    Patient Profile   Eri E Wertheim is a 57 y.o. 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  Admitted with worsening SOB and orthopnea in setting of severe biventricular CHF.   Assessment/Plan   1. Acute on chronic systolic CHF: -  Nonischemic cardiomyopathy based on cath 7/19 in [place]. Remote heavy   ETOH, more recently about a 6 pack/week. HIV negative.  No drugs.  Echo was done this admission with biventricular failure, LV EF 10-20% with dilated RV. There is moderate to severe MR and severe TR with poor coaptation of TV leaflets.  On exam, he was markedly volume overloaded initially.  Low output HF indicated by low Co-ox of 43% initially.  - Coox 61.1% on milrinone 0.375 today.    - cMRI 07/23/18 LVEF EF 14%, Severe RF dysfunction (EF 25%) nonspecific RV insertion site LGE pattern likely from pressure/volume overload. - One more day IV lasix 80 mg IV bid with stable creatinine and CVP 9-10. He was on lasix 100 mg BID at home. Will likely need torsemide now.  - No beta blocker with low output.  - Increase spironolactone to 25 mg daily.  - Continue losartan 12.5 mg BID.  - Continue digoxin 0.125 daily.  - RHC today.  We discussed risks/benefits of procedure and he agrees.  - Suspect that he is nearing the need for advanced therapies.  We may not be able to successfully wean him off milrinone.  He has been noted to have cirrhosis on RUQ US and has significant RV dysfunction.  LVAD may not be a good option for him with RV dysfunction, will see what his RHC  looks like after diuresis.  He will need evaluation in a transplant center.  Will ask our LVAD nurses to see him.  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   2. OSA:  - CPAP qhs.   3. COPD: - Will need eventual PFTs.  He had mostly quit smoking before admission.  - No change to current plan.   4. 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 ?component of ETOH cirrhosis given prior heavy ETOH (now drinking only 4-5 beers in a week's time). RUQ US showed ascites with evidence for cirrhosis. NH3 with elevated at 64 on 9/3.  He is much less lethargic today than initially.  - Send viral hepatitis labs => Negative.  5. RLE DVT: -  He has a right peroneal vein (below knee) DVT.  Even though below knee, think he is a relatively high risk patient with severe cardiomyopathy.  Would favor 6 months anticoagulation.  -  He is on heparin gtt for now, switch to DOAC after RHC today.   Michael Andrew Tillery, PA-C  07/24/2018 8:40 AM  Advanced Heart Failure Team Pager [phone] (M-F; 7a - 4p)  Please contact [facility] for night-coverage after hours (4p -7a ) and weekends on [email or web address]  Patient seen with PA, agree with the above note.    He diuresed well again yesterday.  CVP 9-10 today with co-ox 61%.  Cardiac MRI yesterday as noted above, has biventricular failure.    On exam, JVP 9-10 cm.  Regular S1S2 with 2/6 HSM LLSB/apex. No edema. Mild abdominal distention but soft.   Continue milrinone 0.375 today and will give 1 more day of IV Lasix, likely to torsemide tomorrow.  Increase spironolactone to 25 mg daily and continue losartan and digoxin.  - RHC today, need to pay especial attention to RV function.   Cirrhotic-appearing liver on US.  Negative viral hepatitis labs.  Used to drink heavily, may be related to combination of ETOH and RV failure.  He now drinks just a few beers/week (corroborated by significant other).   I suspect he is at the point where he needs  advanced therapies.  He has biventricular   failure with significant RV dysfunction on imaging.  He also has a cirrhotic-appearing liver on US.  Albumin mildly low and total bilirubin mildly elevated.  Unfortunately, he was still smoking a little prior to admission (not heavily though).  - Check INR today for liver synthetic function and will get type and screen.  - Will evaluate for LVAD here.  Will see what RHC looks like today, but RV function may be prohibitive.  - Suspect he will be milrinone dependent.  If no LVAD, will need evaluation in transplant center.  He will need to stay completely off cigarettes and ETOH.   CRITICAL CARE Performed by: Janeene Sand  Total critical care time: 35 minutes  Critical care time was exclusive of separately billable procedures and treating other patients.  Critical care was necessary to treat or prevent imminent or life-threatening deterioration.  Critical care was time spent personally by me on the following activities: development of treatment plan with patient and/or surrogate as well as nursing, discussions with consultants, evaluation of patient's response to treatment, examination of patient, obtaining history from patient or surrogate, ordering and performing treatments and interventions, ordering and review of laboratory studies, ordering and review of radiographic studies, pulse oximetry and re-evaluation of patient's condition.  Chemere Steffler 07/24/2018 8:58 AM     

## 2018-07-24 NOTE — Progress Notes (Signed)
ANTICOAGULATION CONSULT NOTE - Follow Up Consult  Pharmacy Consult for heparin Indication: DVT  Labs: Recent Labs    07/21/18 1012 07/21/18 1447 07/21/18 2113 07/22/18 0437  07/23/18 0225 07/23/18 1203 07/23/18 1355 07/24/18 0245  HGB 14.1  --   --  12.7*  --  12.2*  --   --  12.3*  HCT 43.2  --   --  39.7  --  36.9*  --   --  38.2*  PLT 160  --   --  152  --  154  --   --  152  LABPROT  --   --   --  17.2*  --   --   --   --   --   INR  --   --   --  1.41  --   --   --   --   --   HEPARINUNFRC  --   --   --   --    < > 0.19* 0.91* 0.33 0.28*  CREATININE 1.10  --   --  1.13  --  1.08  --   --  1.02  TROPONINI <0.03 <0.03 <0.03  --   --   --   --   --   --    < > = values in this interval not displayed.    Assessment: 57yo male subtherapeutic on heparin after one level at low end of goal; order was never changed and heparin is currently running at 1250 units/hr.  Goal of Therapy:  Heparin level 0.3-0.7 units/ml   Plan:  Will increase heparin gtt by 1-2 units/kg/hr to 1350 units/hr and check level in 6 hours.    Vernard Gambles, PharmD, BCPS  07/24/2018,4:04 AM

## 2018-07-24 NOTE — Progress Notes (Signed)
ANTICOAGULATION CONSULT NOTE  Pharmacy Consult for heparin Indication: DVT  No Known Allergies  Patient Measurements: Height: 5\' 10"  (177.8 cm) Weight: 145 lb 1 oz (65.8 kg) IBW/kg (Calculated) : 73 Heparin Dosing Weight: 67.7 kg  Vital Signs: Temp: 97.6 F (36.4 C) (09/06 1418) Temp Source: Oral (09/06 1418) BP: 92/74 (09/06 1530) Pulse Rate: 92 (09/06 1530)  Labs: Recent Labs    07/21/18 2113  07/22/18 0437 07/23/18 0225  07/23/18 1355 07/24/18 0245 07/24/18 0937 07/24/18 1119  HGB  --    < > 12.7* 12.2*  --   --  12.3*  --  12.7*  HCT  --    < > 39.7 36.9*  --   --  38.2*  --  39.1  PLT  --    < > 152 154  --   --  152  --  162  LABPROT  --   --  17.2*  --   --   --   --   --   --   INR  --   --  1.41  --   --   --   --   --   --   HEPARINUNFRC  --   --   --  0.19*   < > 0.33 0.28* 0.34  --   CREATININE  --    < > 1.13 1.08  --   --  1.02  --  0.95  TROPONINI <0.03  --   --   --   --   --   --   --   --    < > = values in this interval not displayed.    Estimated Creatinine Clearance: 77.9 mL/min (by C-G formula based on SCr of 0.95 mg/dL).   Assessment: 57 y.o. male with RLE DVT for heparin. No anticoagulation PTA.   Heparin level is therapeutic this morning at 0.34, on 1350 units/hr. Hgb 12.7, plt 162. No s/sx of bleeding.   Patient s/p RHC this afternoon, original discussion was to restart apixaban, but plans have changed and will now restart heparin this afternoon in anticipation of possible heart pump.   Goal of Therapy:  Heparin level 0.3-0.7 units/ml Monitor platelets by anticoagulation protocol: Yes   Plan:  Restart heparin infusion at 1350 units/hr this evening Monitor daily HL, CBC, s/sx of bleeding  Sheppard Coil PharmD., BCPS Clinical Pharmacist 07/24/2018 4:01 PM

## 2018-07-24 NOTE — H&P (View-Only) (Signed)
Patient ID: Theodore Welch, male   DOB: 1961/11/13, 57 y.o.   MRN: [id number]     Advanced Heart Failure Rounding Note  PCP-Cardiologist: No primary care provider on file.   Subjective:    PICC line placed 07/21/18. Initial Coox 43%. Started on milrinone 0.25 mcg/kg/min, increased to 0.375 with persistently low co-ox.    Coox 61.1% today on milrinone 0.375 mcg/kg/min. CVP 9-10 cm.  Good diuresis again yesterday.   Feeling good this am. Snoring heavily on my arrival to room but awakens easily. Denies SOB, orthopnea, or PND.   Venous US 07/22/18 showed right peroneal vein DVT, now on heparin gtt.   cMRI 07/23/18 1.  Moderate dilated LV with EF 14%, severe diffuse hypokinesis. 2. Severely dilated RV with moderate to severe systolic dysfunction, EF 25%. 3. There mid-wall LGE at the inferior RV insertion site. This is a nonspecific LGE pattern and can be seen with pressure/volume overload.   Objective:    Weight Range: 66.7 kg Body mass index is 21.1 kg/m.   Vital Signs:   Temp:  [97.6 F (36.4 C)-98.3 F (36.8 C)] 98.3 F (36.8 C) (09/06 0700) Pulse Rate:  [91-112] 96 (09/06 0700) Resp:  [14-21] 14 (09/06 0700) BP: (81-100)/(68-84) 100/80 (09/06 0700) SpO2:  [97 %-100 %] 99 % (09/06 0700) Weight:  [66.7 kg] 66.7 kg (09/06 0434) Last BM Date: 07/23/18  Weight change: Filed Weights   07/22/18 0500 07/23/18 0508 07/24/18 0434  Weight: 66.5 kg 66.5 kg 66.7 kg   Intake/Output:   Intake/Output Summary (Last 24 hours) at 07/24/2018 0840 Last data filed at 07/24/2018 0800 Gross per 24 hour  Intake 701.57 ml  Output 4600 ml  Net -3898.43 ml    Physical Exam    General: NAD  HEENT: Normal Neck: Supple. JVP 9-10 cm. Carotids 2+ bilat; no bruits. No thyromegaly or nodule noted. Cor: PMI nondisplaced. RRR, 2/6 HSM apex/LLSB.  Lungs: CTAB, normal effort. Abdomen: Soft, non-tender, non-distended, no HSM. No bruits or masses. +BS  Extremities: No cyanosis, clubbing, or rash. R  and LLE no edema.  Neuro: Alert & orientedx3, cranial nerves grossly intact. moves all 4 extremities w/o difficulty. Affect pleasant   Telemetry   NSR 80-90s, personally reviewed.   EKG    No new tracings.    Labs    CBC Recent Labs    07/23/18 0225 07/24/18 0245  WBC 7.6 6.9  HGB 12.2* 12.3*  HCT 36.9* 38.2*  MCV 86.6 86.4  PLT 154 152   Basic Metabolic Panel Recent Labs    13/08/65 0225 07/24/18 0245  NA 139 135  K 3.7 4.0  CL 101 101  CO2 26 25  GLUCOSE 94 95  BUN 15 19  CREATININE 1.08 1.02  CALCIUM 8.8* 8.8*  MG 1.8  --    Liver Function Tests Recent Labs    07/21/18 1012 07/22/18 0437  AST 23 20  ALT 15 13  ALKPHOS 126 110  BILITOT 3.4* 3.5*  PROT 7.5 6.0*  ALBUMIN 3.9 3.1*   No results for input(s): LIPASE, AMYLASE in the last 72 hours. Cardiac Enzymes Recent Labs    07/21/18 1012 07/21/18 1447 07/21/18 2113  TROPONINI <0.03 <0.03 <0.03    BNP: BNP (last 3 results) Recent Labs    07/21/18 0540  BNP 1,343.9*    ProBNP (last 3 results) No results for input(s): PROBNP in the last 8760 hours.   D-Dimer No results for input(s): DDIMER in the last 72 hours. Hemoglobin A1C  Recent Labs    07/21/18 1012  HGBA1C 6.5*   Fasting Lipid Panel Recent Labs    07/21/18 1012  CHOL 97  HDL 32*  LDLCALC 57  TRIG 39  CHOLHDL 3.0   Thyroid Function Tests No results for input(s): TSH, T4TOTAL, T3FREE, THYROIDAB in the last 72 hours.  Invalid input(s): FREET3  Other results:   Imaging    Mr Cardiac Morphology W Wo Contrast  Result Date: 07/23/2018 CLINICAL DATA:  Cardiomyopathy of uncertain etiology EXAM: CARDIAC MRI TECHNIQUE: The patient was scanned on a 1.5 Tesla GE magnet. A dedicated cardiac coil was used. Functional imaging was done using Fiesta sequences. 2,3, and 4 chamber views were done to assess for RWMA's. Modified Simpson's rule using a short axis stack was used to calculate an ejection fraction on a dedicated work  [employer]. The patient received 28 cc of Multihance. After 10 minutes inversion recovery sequences were used to assess for infiltration and scar tissue. CONTRAST:  28 cc Multihance FINDINGS: Limited images of the lung fields with no gross abnormalities. There was a small circumferential pericardial effusion. Moderately dilated left ventricle with normal wall thickness. Severe diffuse hypokinesis, LV EF 14%. No LV thrombus noted. Severe RV dilation and moderate to severe systolic dysfunction, EF 25%. Moderate to severe biatrial enlargement. Severe tricuspid regurgitation. Moderate to severe mitral regurgitation (flow sequences to quantify mitral regurgitation were not done). Trileaflet aortic valve without significant stenosis or regurgitation. On delayed enhancement imaging, there was mid-wall late gadolinium enhancement (LGE) in the basal to mid inferoseptal wall RV at the inferior RV insertion site. Measurements: LVEDV 342 mL LVSV 48 mL LVEF 14% RVEDV 412 mL RVSV 102 mL RVEF 25% IMPRESSION: 1.  Moderate dilated LV with EF 14%, severe diffuse hypokinesis. 2. Severely dilated RV with moderate to severe systolic dysfunction, EF 25%. 3. There mid-wall LGE at the inferior RV insertion site. This is a nonspecific LGE pattern and can be seen with pressure/volume overload. Xavien Dauphinais Electronically Signed   By: Marca Ancona M.D.   On: 07/23/2018 22:18     Medications:     Scheduled Medications: . aspirin EC  81 mg Oral Daily  . atorvastatin  10 mg Oral Daily  . digoxin  0.125 mg Oral Daily  . DULoxetine  60 mg Oral Daily  . gabapentin  600 mg Oral TID  . insulin aspart  0-9 Units Subcutaneous TID WC  . losartan  12.5 mg Oral BID  . pantoprazole  40 mg Oral Daily  . sodium chloride flush  10-40 mL Intracatheter Q12H  . sodium chloride flush  3 mL Intravenous Q12H  . sodium chloride flush  3 mL Intravenous Q12H  . spironolactone  25 mg Oral Daily  . thiamine  100 mg Oral Daily     Infusions: . sodium chloride    . sodium chloride 10 mL/hr at 07/24/18 0027  . heparin 1,350 Units/hr (07/24/18 0402)  . milrinone 0.375 mcg/kg/min (07/24/18 0147)    PRN Medications: sodium chloride, acetaminophen **OR** acetaminophen, hydrOXYzine, ipratropium-albuterol, methocarbamol, nicotine, senna-docusate, sodium chloride flush, sodium chloride flush    Patient Profile   Theodore Welch is a 57 y.o. 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  Admitted with worsening SOB and orthopnea in setting of severe biventricular CHF.   Assessment/Plan   1. Acute on chronic systolic CHF: -  Nonischemic cardiomyopathy based on cath 7/19 in [place]. Remote heavy  ETOH, more recently about a 6 pack/week. HIV negative.  No drugs.  Echo was done this admission with biventricular failure, LV EF 10-20% with dilated RV. There is moderate to severe MR and severe TR with poor coaptation of TV leaflets.  On exam, he was markedly volume overloaded initially.  Low output HF indicated by low Co-ox of 43% initially.  - Coox 61.1% on milrinone 0.375 today.    - cMRI 07/23/18 LVEF EF 14%, Severe RF dysfunction (EF 25%) nonspecific RV insertion site LGE pattern likely from pressure/volume overload. - One more day IV lasix 80 mg IV bid with stable creatinine and CVP 9-10. He was on lasix 100 mg BID at home. Will likely need torsemide now.  - No beta blocker with low output.  - Increase spironolactone to 25 mg daily.  - Continue losartan 12.5 mg BID.  - Continue digoxin 0.125 daily.  - RHC today.  We discussed risks/benefits of procedure and he agrees.  - Suspect that he is nearing the need for advanced therapies.  We may not be able to successfully wean him off milrinone.  He has been noted to have cirrhosis on RUQ [name] and has significant RV dysfunction.  LVAD may not be a good option for him with RV dysfunction, will see what his RHC  looks like after diuresis.  He will need evaluation in a transplant center.  Will ask our LVAD nurses to see him.  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   2. OSA:  - CPAP qhs.   3. COPD: - Will need eventual PFTs.  He had mostly quit smoking before admission.  - No change to current plan.   4. 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 ?component of ETOH cirrhosis given prior heavy ETOH (now drinking only 4-5 beers in a week's time). RUQ US showed ascites with evidence for cirrhosis. NH3 with elevated at 64 on 9/3.  He is much less lethargic today than initially.  - Send viral hepatitis labs => Negative.  5. RLE DVT: -  He has a right peroneal vein (below knee) DVT.  Even though below knee, think he is a relatively high risk patient with severe cardiomyopathy.  Would favor 6 months anticoagulation.  -  He is on heparin gtt for now, switch to DOAC after RHC today.   Graciella Freer, PA-C  07/24/2018 8:40 AM  Advanced Heart Failure Team Pager [phone] (M-F; 7a - 4p)  Please contact [facility] for night-coverage after hours (4p -7a ) and weekends on [email or web address]  Patient seen with PA, agree with the above note.    He diuresed well again yesterday.  CVP 9-10 today with co-ox 61%.  Cardiac MRI yesterday as noted above, has biventricular failure.    On exam, JVP 9-10 cm.  Regular S1S2 with 2/6 HSM LLSB/apex. No edema. Mild abdominal distention but soft.   Continue milrinone 0.375 today and will give 1 more day of IV Lasix, likely to torsemide tomorrow.  Increase spironolactone to 25 mg daily and continue losartan and digoxin.  - RHC today, need to pay especial attention to RV function.   Cirrhotic-appearing liver on [name].  Negative viral hepatitis labs.  Used to drink heavily, may be related to combination of ETOH and RV failure.  He now drinks just a few beers/week (corroborated by significant other).   I suspect he is at the point where he needs  advanced therapies.  He has biventricular  failure with significant RV dysfunction on imaging.  He also has a cirrhotic-appearing liver on [name].  Albumin mildly low and total bilirubin mildly elevated.  Unfortunately, he was still smoking a little prior to admission (not heavily though).  - Check INR today for liver synthetic function and will get type and screen.  - Will evaluate for LVAD here.  Will see what RHC looks like today, but RV function may be prohibitive.  - Suspect he will be milrinone dependent.  If no LVAD, will need evaluation in transplant center.  He will need to stay completely off cigarettes and ETOH.   CRITICAL CARE Performed by: Marca Ancona  Total critical care time: 35 minutes  Critical care time was exclusive of separately billable procedures and treating other patients.  Critical care was necessary to treat or prevent imminent or life-threatening deterioration.  Critical care was time spent personally by me on the following activities: development of treatment plan with patient and/or surrogate as well as nursing, discussions with consultants, evaluation of patient's response to treatment, examination of patient, obtaining history from patient or surrogate, ordering and performing treatments and interventions, ordering and review of laboratory studies, ordering and review of radiographic studies, pulse oximetry and re-evaluation of patient's condition.  Marca Ancona 07/24/2018 8:58 AM

## 2018-07-24 NOTE — Progress Notes (Signed)
Transported to the cath lab by bed awake and alert. 

## 2018-07-25 DIAGNOSIS — E440 Moderate protein-calorie malnutrition: Secondary | ICD-10-CM

## 2018-07-25 LAB — BASIC METABOLIC PANEL
Anion gap: 8 (ref 5–15)
BUN: 15 mg/dL (ref 6–20)
CHLORIDE: 105 mmol/L (ref 98–111)
CO2: 25 mmol/L (ref 22–32)
Calcium: 8.8 mg/dL — ABNORMAL LOW (ref 8.9–10.3)
Creatinine, Ser: 0.94 mg/dL (ref 0.61–1.24)
GFR calc non Af Amer: >60 mL/min (ref >60)
Glucose, Bld: 98 mg/dL (ref 70–99)
POTASSIUM: 4.6 mmol/L (ref 3.5–5.1)
SODIUM: 138 mmol/L (ref 135–145)

## 2018-07-25 LAB — COOXEMETRY PANEL
Carboxyhemoglobin: 1.9 % — ABNORMAL HIGH (ref 0.5–1.5)
Methemoglobin: 0.9 % (ref 0.0–1.5)
O2 Saturation: 75.1 %
Total hemoglobin: 12.2 g/dL (ref 12.0–16.0)

## 2018-07-25 LAB — CBC
HEMATOCRIT: 38.9 % — AB (ref 39.0–52.0)
HEMOGLOBIN: 12.6 g/dL — AB (ref 13.0–17.0)
MCH: 28.3 pg (ref 26.0–34.0)
MCHC: 32.4 g/dL (ref 30.0–36.0)
MCV: 87.2 fL (ref 78.0–100.0)
Platelets: 165 10*3/uL (ref 150–400)
RBC: 4.46 MIL/uL (ref 4.22–5.81)
RDW: 15.6 % — ABNORMAL HIGH (ref 11.5–15.5)
WBC: 6.7 10*3/uL (ref 4.0–10.5)

## 2018-07-25 LAB — RAPID URINE DRUG SCREEN, HOSP PERFORMED
AMPHETAMINES: NOT DETECTED
Barbiturates: NOT DETECTED
Benzodiazepines: POSITIVE — AB
COCAINE: NOT DETECTED
Opiates: NOT DETECTED
Tetrahydrocannabinol: POSITIVE — AB

## 2018-07-25 LAB — HEPARIN LEVEL (UNFRACTIONATED)
HEPARIN UNFRACTIONATED: 0.29 [IU]/mL — AB (ref 0.30–0.70)
Heparin Unfractionated: 0.49 IU/mL (ref 0.30–0.70)

## 2018-07-25 LAB — PROTIME-INR
INR: 1.31
Prothrombin Time: 16.1 seconds — ABNORMAL HIGH (ref 11.4–15.2)

## 2018-07-25 LAB — GLUCOSE, CAPILLARY
GLUCOSE-CAPILLARY: 90 mg/dL (ref 70–99)
Glucose-Capillary: 98 mg/dL (ref 70–99)
Glucose-Capillary: 101 mg/dL — ABNORMAL HIGH (ref 70–99)
Glucose-Capillary: 108 mg/dL — ABNORMAL HIGH (ref 70–99)

## 2018-07-25 LAB — LUPUS ANTICOAGULANT PANEL
DRVVT: 32.8 s (ref 0.0–47.0)
PTT Lupus Anticoagulant: 40.4 s (ref 0.0–51.9)

## 2018-07-25 LAB — MAGNESIUM: Magnesium: 2 mg/dL (ref 1.7–2.4)

## 2018-07-25 MED ORDER — TORSEMIDE 20 MG PO TABS
60.0000 mg | ORAL_TABLET | Freq: Every day | ORAL | Status: DC
  Administered 2018-07-25: 60 mg via ORAL
  Filled 2018-07-25: qty 3

## 2018-07-25 MED ORDER — ENSURE ENLIVE PO LIQD
237.0000 mL | Freq: Two times a day (BID) | ORAL | Status: DC
  Administered 2018-07-25 – 2018-08-02 (×19): 237 mL via ORAL

## 2018-07-25 NOTE — Progress Notes (Signed)
Initial Nutrition Assessment  DOCUMENTATION CODES:  Non-severe (moderate) malnutrition in context of chronic illness  INTERVENTION:  Assessed patient current eating habits, HF diet knowledge and ability to follow HF guidelines in event receives LVAD  Pt has a good handle on HF diet.  He seems to have support w/ cooking +shopping He knows salt/fluid guidelines He has poor appetite/malnutrition, but relates this to manifestation of end stage HF  Provided further education. Answered patient/family questions  Ensure Enlive po BID, each supplement provides 350 kcal and 20 grams of protein  NUTRITION DIAGNOSIS:  Increased nutrient needs related to catabolic illness(End stage HF, cirrhosis) as evidenced by moderate muscle/fat loss and apparent loss of 40 lbs (20%) bx in just over 1 year  GOAL:  Patient will meet greater than or equal to 90% of their needs  MONITOR:  PO intake, Supplement acceptance, Labs, Weight trends  REASON FOR ASSESSMENT:  Consult LVAD Eval  ASSESSMENT:  57 y/o male PMHx DM2, HTN/HLD, OSA on CPAP, CHF, Cirrhosis, ongoing Tobacco abuse, past etoh abuse (3 mo ago). Diagnosed w/ NICM 04/2018. Has had worsening edema despite titration of diuretics. Presented w/ 2-3 hx of acute increase of BLE edema, Orthopnea and PND. Admitted for AoC HF. With worsening HF, LVAD process started.   Met with patient and family member. Patient reports a good appetite, but family member says the patient does not eat well at home and prefers to nap. Patient relates this to poor quality of sleep because of apnea. Which is understandable. This would improve with lvad  Going through diet recall, pt seems to have a very good handle on the HF diet. He recalls many of the items containing hidden salt without any prompting. He also verbalizes the salt/fluid guidelines without any prompting. They read labels. They do not eat fast food. They eat fresh/frozen items.   In event pt is selected for LVAD,  RD asked if he would be able to maintain this lifestyle, cook foods at home and be able to purchase groceries. He says he is certain he could. He notes he has many family members willing to assist him.   RD also reviewed importance of high calorie/protein diet given significant level of stress on his body. He already exhibits some muscle/fat wasting and weight loss.  Per Care Everywhere, pt has to have lost significant wt in the last year, being last documented May 2018 at [facility] at 189.2 and then in July 2018 at [facility] at 183 lbs. His dry weight this admission is 145 lbs.   Given suboptimal appetite, RD recommended supplements. He was agreeable. Will begin EE BID   Labs: Bgs 90-100, A1C 6/3:6.5 Meds: PPI, Demadex, Milrinone, Thiamine  Recent Labs  Lab 07/23/18 0225 07/24/18 0245 07/24/18 1119 07/25/18 0227  NA 139 135  --  138  K 3.7 4.0  --  4.6  CL 101 101  --  105  CO2 26 25  --  25  BUN 15 19  --  15  CREATININE 1.08 1.02 0.95 0.94  CALCIUM 8.8* 8.8*  --  8.8*  MG 1.8  --   --  2.0  GLUCOSE 94 95  --  98   NUTRITION - FOCUSED PHYSICAL EXAM:   Most Recent Value  Orbital Region  Mild depletion  Upper Arm Region  Moderate depletion  Thoracic and Lumbar Region  Moderate depletion  Buccal Region  Mild depletion  Temple Region  Mild depletion  Clavicle Bone Region  Moderate depletion  Clavicle  and Acromion Bone Region  Moderate depletion  Scapular Bone Region  Mild depletion  Dorsal Hand  No depletion  Patellar Region  No depletion  Anterior Thigh Region  Mild depletion  Posterior Calf Region  No depletion  Edema (RD Assessment)  None     Diet Order:   Diet Order            Diet Carb Modified Fluid consistency: Thin; Room service appropriate? Yes  Diet effective now             EDUCATION NEEDS:  Education needs have been addressed  Skin:  Skin Assessment: Reviewed RN Assessment  Last BM:  9/5  Height:  Ht Readings from Last 1 Encounters:  07/21/18 '5\' 10"'$   (1.778 m)   Weight:  Wt Readings from Last 1 Encounters:  07/25/18 67.3 kg   Wt Readings from Last 10 Encounters:  07/25/18 67.3 kg  06/20/18 65.3 kg  Dry wt: 66 kg   Ideal Body Weight:  75.45 kg  BMI:  Body mass index is 21.29 kg/m.  Estimated Nutritional Needs:  Kcal:  2100-2300 kcals (32-35 kcal/kg bw) Protein:  112-134g (1.7-2g/kg bw) Fluid:  Per MD fluid goals  Burtis Junes RD, LDN, CNSC Clinical Nutrition Available Tues-Sat via Pager: [phone] 07/25/2018 10:35 AM

## 2018-07-25 NOTE — Progress Notes (Addendum)
Subjective:  TheodoreTheodore Welch is out of bed and sitting in a chair on exam this morning. He reports feeling well and reviewing material regarding "the pump" (LVAD). He reports his case will be discussed on Monday and he is optimistic about receiving LVAD.   Objective:  Vital signs in last 24 hours: Vitals:   07/24/18 2335 07/25/18 0000 07/25/18 0353 07/25/18 0725  BP: (!) 87/68 (!) 86/68 92/73 101/81  Pulse: 99 94 97 96  Resp: (!) 30 (!) 30 (!) 22 20  Temp: 98.2 F (36.8 C)  (!) 97.5 F (36.4 C) 98.3 F (36.8 C)  TempSrc: Oral  Oral Oral  SpO2: 100% 97% 96% 95%  Weight:   67.3 kg   Height:       Weight change: 0.6 kg  Intake/Output Summary (Last 24 hours) at 07/25/2018 1127 Last data filed at 07/25/2018 0600 Gross per 24 hour  Intake 879.4 ml  Output 1600 ml  Net -720.6 ml   Physical Exam General: Pleasant mood, sitting in chair CV: RRR, nl s1, s2 Lungs: no rales, wheezes , rhonchi Abdomen: Soft, NT, ND  Ext: No LE edema. No erythema   Assessment/Plan:  Principal Problem:   CHF (congestive heart failure) (HCC) Active Problems:   Tobacco use   Congestive heart failure (HCC)   Type 2 diabetes mellitus (HCC)   Hyperlipidemia   Ascites  Patient Profile: Theodore Welch is a 57yo M with DM2,HTN, HLD, COPD, who presents with worsening of severe biventricular heart failure.  Patient presented with PND,orthopnea, and LE edema that has worsened over the past 2-3 weeks. He is feeling better with diuresisand new medications.Net negative 8.5 Lsinceadmission.Co-ox improved on milrinone.C-MRI on 9/5 without signs of infiltrative disease.  RHC on 9/6 showed good cardiac output and cardiology discussing possible LVAD placement with entire team early next week.  Severe biventricular CHF 2/2 NICM    C-MRI yesterday without signs of infiltrative disease. HF Cards believes patient will likely be milrinone dependent but discussion for LVAD underway and family/patient appear amenable.  Will still need to consider evaluation for transplant. - following cardiology-heart failure recommendations  - Patient wearing Lifevest at home  - Continue digoxin0.125. Milrinone .375, ASA 81mg , losartan 12.5mg  BID, spirolactone 25mg  -Stop IV lasix. Heart failure to start torsemide 60 mg daily  RLE DVT Right peroneal DVT. Plan for 6 months anticoagulation. - Continue heparin gtt for now with possible LVAD placement in mind early next week.  Cirrhosis  RUQ [name] 9/3 shows cirrhotic changes.Heavy alcohol use off and on for many years.  He has quit drinking for 4 months now. Hepatitis panel neg. Likely worsened 2/2 congestive heart failure. Neg hep B antibody. INR 1.2.  - will need immunization of hep B in future  DM2 CBGs controlled.Hgb A1c 6.5%. Holding home Metformin 500 mg BID.  - HH/Carb mod diet - Sensitive sling-scale insulin but CBG's ~90's  Anxiety - Continue home Cymbalta (also for chronic low back pain) - Continue home PRN Hydroxyzine 25mg  TID   COPD :From record, PFT's 5 years ago at [facility] . Schedule PFT's pending clinical improvement  Dispo: Pending further diuresis , workup , and planning of advance therapy options for biventricular CHF. Patient likely dependent on milrinone and will need discharge to transplant center.   Diet: HH/Carb mod DVT PPx: Heparin gtt IVF: None Code: Full [place] Phlegm, Medical Student 07/25/2018, 11:27 AM   Attestation for Student Documentation:  I personally was present and performed or re-performed the history, physical exam  and medical decision-making activities of this service and have verified that the service and findings are accurately documented in the student's note.  Lanelle Bal, MD 07/25/2018, 12:14 PM

## 2018-07-25 NOTE — Progress Notes (Addendum)
Patient ID: Theodore Welch, male   DOB: 1961/06/19, 57 y.o.   MRN: [id number]     Advanced Heart Failure Rounding Note  PCP-Cardiologist: No primary care provider on file.   Subjective:    PICC line placed 07/21/18. Initial Co-ox 43%. Started on milrinone 0.25 mcg/kg/min, increased to 0.375 with persistently low co-ox.   Coox 75% today on milrinone 0.375 mcg/kg/min. Sitting up in chair today, no CVP.     Feels good on milrinone. No complaints today, good appetite.   Venous US 07/22/18 showed right peroneal vein DVT, now on heparin gtt.   cMRI 07/23/18 1.  Moderate dilated LV with EF 14%, severe diffuse hypokinesis. 2. Severely dilated RV with moderate to severe systolic dysfunction, EF 25%. 3. There mid-wall LGE at the inferior RV insertion site. This is a nonspecific LGE pattern and can be seen with pressure/volume Overload.  RHC Procedural Findings (9/7): Hemodynamics (on milrinone 0.375 mcg/kg/min) RA mean 8 RV 40/12 PA 40/19, mean 27 PCWP mean 12 CVP/PCWP 0.66 PAPi 2.65 Oxygen saturations: PA 70% AO 95% Cardiac Output (Fick) 5.84  Cardiac Index (Fick) 3.18 PVR 2.6 WU (Fick) CardiacOutput (Thermo) 4.97 Cardiac Index (Themo) 2.71  PVR 3 WU (Thermo)   Objective:    Weight Range: 67.3 kg Body mass index is 21.29 kg/m.   Vital Signs:   Temp:  [97.4 F (36.3 C)-98.3 F (36.8 C)] 98.3 F (36.8 C) (09/07 0725) Pulse Rate:  [0-100] 96 (09/07 0725) Resp:  [0-60] 20 (09/07 0725) BP: (84-105)/(64-81) 101/81 (09/07 0725) SpO2:  [0 %-100 %] 95 % (09/07 0725) Weight:  [67.3 kg] 67.3 kg (09/07 0353) Last BM Date: 07/23/18  Weight change: Filed Weights   07/24/18 0434 07/24/18 0700 07/25/18 0353  Weight: 66.7 kg 65.8 kg 67.3 kg   Intake/Output:   Intake/Output Summary (Last 24 hours) at 07/25/2018 0938 Last data filed at 07/25/2018 0600 Gross per 24 hour  Intake 921.64 ml  Output 1600 ml  Net -678.36 ml    Physical Exam    General: NAD Neck: JVP 8 cm, no  thyromegaly or thyroid nodule.  Lungs: Clear to auscultation bilaterally with normal respiratory effort. CV: Lateral PMI.  Heart regular S1/S2, no S3/S4, 2/6 HSM LLSB/apex.  No peripheral edema.   Abdomen: Soft, nontender, no hepatosplenomegaly, no distention.  Skin: Intact without lesions or rashes.  Neurologic: Alert and oriented x 3.  Psych: Normal affect. Extremities: No clubbing or cyanosis.  HEENT: Normal.   Telemetry   NSR 90s, personally reviewed.   EKG    No new tracings.    Labs    CBC Recent Labs    07/24/18 1119 07/25/18 0227  WBC 6.1 6.7  HGB 12.7* 12.6*  HCT 39.1 38.9*  MCV 87.5 87.2  PLT 162 165   Basic Metabolic Panel Recent Labs    40/98/11 0225 07/24/18 0245 07/24/18 1119 07/25/18 0227  NA 139 135  --  138  K 3.7 4.0  --  4.6  CL 101 101  --  105  CO2 26 25  --  25  GLUCOSE 94 95  --  98  BUN 15 19  --  15  CREATININE 1.08 1.02 0.95 0.94  CALCIUM 8.8* 8.8*  --  8.8*  MG 1.8  --   --  2.0   Liver Function Tests No results for input(s): AST, ALT, ALKPHOS, BILITOT, PROT, ALBUMIN in the last 72 hours. No results for input(s): LIPASE, AMYLASE in the last 72 hours. Cardiac Enzymes No  results for input(s): CKTOTAL, CKMB, CKMBINDEX, TROPONINI in the last 72 hours.  BNP: BNP (last 3 results) Recent Labs    07/21/18 0540  BNP 1,343.9*    ProBNP (last 3 results) No results for input(s): PROBNP in the last 8760 hours.   D-Dimer No results for input(s): DDIMER in the last 72 hours. Hemoglobin A1C No results for input(s): HGBA1C in the last 72 hours. Fasting Lipid Panel No results for input(s): CHOL, HDL, LDLCALC, TRIG, CHOLHDL, LDLDIRECT in the last 72 hours. Thyroid Function Tests Recent Labs    07/24/18 1623  TSH 0.880    Other results:   Imaging    Dg Orthopantogram  Result Date: 07/24/2018 CLINICAL DATA:  Preoperative evaluation prior to a VAD placement. EXAM: ORTHOPANTOGRAM/PANORAMIC COMPARISON:  None. FINDINGS: Bilateral  upper molar metallic fillings. The mid lower teeth are either missing and replaced with a prosthesis or obscured by artifact. No dental caries seen. IMPRESSION: No visible dental cavities. Electronically Signed   By: Beckie Salts M.D.   On: 07/24/2018 23:12   Ct Chest W Contrast  Result Date: 07/24/2018 CLINICAL DATA:  VAD candidacy evaluation. EXAM: CT CHEST, ABDOMEN, AND PELVIS WITH CONTRAST TECHNIQUE: Multidetector CT imaging of the chest, abdomen and pelvis was performed following the standard protocol during bolus administration of intravenous contrast. CONTRAST:  ISOVUE-300 IOPAMIDOL (ISOVUE-300) INJECTION 61% COMPARISON:  Cardiac MRI dated 07/23/2018 and chest CTA dated 07/21/2018. Abdomen and pelvis CT dated 05/03/2008. FINDINGS: CT CHEST FINDINGS Cardiovascular: Diffusely enlarged heart and vomiting all 4 chambers. No pericardial effusion. Mild aortic and proximal coronary artery calcification. Mildly enlarged main pulmonary artery with a transverse diameter of 3.2 cm on image number 28 series 3. Normal sized aorta. Mediastinum/Nodes: Mildly enlarged mediastinal nodes without significant change. The previously demonstrated 1.1 cm short axis right anterior paratracheal node has a short axis diameter of 1.0 cm today on image number 22 series 3. Normal appearing thyroid gland. Lungs/Pleura: Interval minimal bilateral pleural fluid. Mild bibasilar atelectasis, increased. Musculoskeletal: Unremarkable bones. CT ABDOMEN PELVIS FINDINGS Hepatobiliary: Heterogeneous liver with an enlarged lateral segment left lobe and caudate lobe and somewhat small right lobe. Pancreas: Unremarkable. No pancreatic ductal dilatation or surrounding inflammatory changes. 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  The spleen is normal in size. Adrenals/Urinary Tract: Normal appearing adrenal glands. Bilateral renal cysts. Unremarkable bladder and ureters. Stomach/Bowel: Stomach is within normal limits. Appendix appears normal. No  evidence of bowel wall thickening, distention, or inflammatory changes. Vascular/Lymphatic: Atheromatous aortic and iliac artery calcifications without aneurysm. No enlarged lymph nodes. Reproductive: Mildly enlarged prostate gland. Other: Small to moderate amount of free peritoneal fluid with mild improvement since 07/21/2018. Musculoskeletal: Lumbar spine degenerative changes. IMPRESSION: 1. Cardiomegaly involving all 4 chambers. 2. Mildly enlarged main pulmonary artery, possibly due to pulmonary arterial hypertension. 3. Interval minimal bilateral pleural fluid. 4. Mild bibasilar atelectasis, increased. 5. Stable mild mediastinal adenopathy. 6. Heterogeneous liver with findings compatible with cirrhosis. 7. Small to moderate amount of ascites, improved. 8. Mild prostatic hypertrophy. Electronically Signed   By: Beckie Salts M.D.   On: 07/24/2018 22:43   Ct Abdomen Pelvis W Contrast  Result Date: 07/24/2018 CLINICAL DATA:  VAD candidacy evaluation. EXAM: CT CHEST, ABDOMEN, AND PELVIS WITH CONTRAST TECHNIQUE: Multidetector CT imaging of the chest, abdomen and pelvis was performed following the standard protocol during bolus administration of intravenous contrast. CONTRAST:  ISOVUE-300 IOPAMIDOL (ISOVUE-300) INJECTION 61% COMPARISON:  Cardiac MRI dated 07/23/2018 and chest CTA dated 07/21/2018. Abdomen and pelvis CT dated  05/03/2008. FINDINGS: CT CHEST FINDINGS Cardiovascular: Diffusely enlarged heart and vomiting all 4 chambers. No pericardial effusion. Mild aortic and proximal coronary artery calcification. Mildly enlarged main pulmonary artery with a transverse diameter of 3.2 cm on image number 28 series 3. Normal sized aorta. Mediastinum/Nodes: Mildly enlarged mediastinal nodes without significant change. The previously demonstrated 1.1 cm short axis right anterior paratracheal node has a short axis diameter of 1.0 cm today on image number 22 series 3. Normal appearing thyroid gland. Lungs/Pleura:  Interval minimal bilateral pleural fluid. Mild bibasilar atelectasis, increased. Musculoskeletal: Unremarkable bones. CT ABDOMEN PELVIS FINDINGS Hepatobiliary: Heterogeneous liver with an enlarged lateral segment left lobe and caudate lobe and somewhat small right lobe. Pancreas: Unremarkable. No pancreatic ductal dilatation or surrounding inflammatory changes. 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  The spleen is normal in size. Adrenals/Urinary Tract: Normal appearing adrenal glands. Bilateral renal cysts. Unremarkable bladder and ureters. Stomach/Bowel: Stomach is within normal limits. Appendix appears normal. No evidence of bowel wall thickening, distention, or inflammatory changes. Vascular/Lymphatic: Atheromatous aortic and iliac artery calcifications without aneurysm. No enlarged lymph nodes. Reproductive: Mildly enlarged prostate gland. Other: Small to moderate amount of free peritoneal fluid with mild improvement since 07/21/2018. Musculoskeletal: Lumbar spine degenerative changes. IMPRESSION: 1. Cardiomegaly involving all 4 chambers. 2. Mildly enlarged main pulmonary artery, possibly due to pulmonary arterial hypertension. 3. Interval minimal bilateral pleural fluid. 4. Mild bibasilar atelectasis, increased. 5. Stable mild mediastinal adenopathy. 6. Heterogeneous liver with findings compatible with cirrhosis. 7. Small to moderate amount of ascites, improved. 8. Mild prostatic hypertrophy. Electronically Signed   By: Beckie Salts M.D.   On: 07/24/2018 22:43     Medications:     Scheduled Medications: . aspirin EC  81 mg Oral Daily  . atorvastatin  10 mg Oral Daily  . digoxin  0.125 mg Oral Daily  . DULoxetine  60 mg Oral Daily  . gabapentin  600 mg Oral TID  . insulin aspart  0-9 Units Subcutaneous TID WC  . losartan  12.5 mg Oral BID  . pantoprazole  40 mg Oral Daily  . sodium chloride flush  10-40 mL Intracatheter Q12H  . sodium chloride flush  3 mL Intravenous Q12H  . sodium chloride flush   3 mL Intravenous Q12H  . spironolactone  25 mg Oral Daily  . thiamine  100 mg Oral Daily    Infusions: . sodium chloride 10 mL/hr at 07/24/18 1932  . heparin 1,350 Units/hr (07/25/18 0421)  . milrinone 0.375 mcg/kg/min (07/25/18 0421)    PRN Medications: sodium chloride, acetaminophen **OR** acetaminophen, hydrOXYzine, ipratropium-albuterol, methocarbamol, nicotine, ondansetron (ZOFRAN) IV, senna-docusate, sodium chloride flush, sodium chloride flush    Patient Profile   Theodore Welch is a 57 y.o. 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  Admitted with worsening SOB and orthopnea in setting of severe biventricular CHF.   Assessment/Plan   1. Acute on chronic systolic CHF:  Nonischemic cardiomyopathy based on cath 7/19 in [place]. Remote heavy ETOH, says he has quit drinking completely for about 2 months now. HIV negative.  No cocaine/amphetamines.  Echo was done this admission with biventricular failure, LV EF 10-20% with dilated RV. There is moderate to severe MR and severe TR with poor coaptation of TV leaflets.  On exam, he was markedly volume overloaded initially.  Low output HF indicated by low Co-ox of 43% initially.  cMRI 07/23/18 LVEF EF 14%, Severe RF dysfunction (EF 25%) with nonspecific  RV insertion site LGE pattern likely from pressure/volume overload. RHC showed good cardiac output on milrinone with filling pressures nearing normal.  PAPi 2.65 suggests that RV function is better than expected from imaging.  Co-ox 75% today, does not appear significantly volume overloaded on exam (no CVP this morning and is sitting in chair).  - Start torsemide 60 mg daily and follow CVP.  - Continue milrinone 0.375.  - No beta blocker with low output.  - Continue spironolactone 25 mg daily.  - Continue losartan 12.5 mg BID, no BP room to titrate.  - Continue digoxin 0.125 daily.  - Suspect that he will need for  advanced therapies.  I think that he is going to be milrinone-dependent.  He has been noted to have cirrhosis on RUQ [name] and CT and has significant RV dysfunction.  INR 1.2, so liver synthetic function is not markedly abnormal.  He was smoking marijuana and occasional cigarettes prior to admission, so will not be a transplant candidate until abstinent 6 months.  I do not think that he has that long to wait.  RHC on milrinone and with diuresis on 9/6 looked good, PAPi was 2.65 which seems to indicate adequate RV function. We have initiated LVAD workup, will need evaluation by LVAD surgeon.  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   2. OSA: CPAP qhs.   3. COPD:  He had mostly quit smoking before admission but still smoked a few cigarettes/week.  - Check PFTs.   4. 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 ?component of ETOH cirrhosis given prior heavy ETOH (has now quit). RUQ US showed ascites with evidence for cirrhosis. NH3 with initial elevation at 64 on 9/3. CT abdomen showed resolution of ascites but there was evidence for cirrhosis.  INR 1.2, adequate liver synthetic function.  - Send viral hepatitis labs => Negative.  5. RLE DVT:  He has a right peroneal vein (below knee) DVT.  Even though below knee, think he is a relatively high risk patient with severe cardiomyopathy.  Would favor 6 months anticoagulation.  -  He is on heparin gtt for now.  Will leave on heparin gtt due to possibility of LVAD this admission.  6. Valvular heart disease: Moderate to severe MR and severe TR on echo.  Suspect this is functional due to annular dilation. Hopefully TR has improved with diuresis.   Marca Ancona, MD  07/25/2018 9:38 AM  Advanced Heart Failure Team Pager [phone] (M-F; 7a - 4p)  Please contact [facility] for night-coverage after hours (4p -7a ) and weekends on [email or web address]

## 2018-07-25 NOTE — Progress Notes (Signed)
Patient declined CPAP at this time, no distress noted RCP will continue to follow.  

## 2018-07-25 NOTE — Progress Notes (Signed)
  Date: 07/25/2018  Patient name: Theodore Welch  Medical record number: [id number]  Date of birth: January 24, 1961   I have seen and evaluated this patient and I have discussed the plan of care with the house staff. Please see Dr Crista Elliot and MS4 Steen's note for complete details. I concur with their findings.  Yesterday, was decided to remain on heparin until decision about LVAD made.  CT surgery to see patient, possibly Monday.   Inez Catalina, MD 07/25/2018, 5:27 PM

## 2018-07-25 NOTE — Progress Notes (Signed)
ANTICOAGULATION CONSULT NOTE  Pharmacy Consult for heparin Indication: DVT  No Known Allergies  Patient Measurements: Height: 5\' 10"  (177.8 cm) Weight: 148 lb 5.9 oz (67.3 kg) IBW/kg (Calculated) : 73 Heparin Dosing Weight: 67.7 kg  Vital Signs: Temp: 98.3 F (36.8 C) (09/07 0725) Temp Source: Oral (09/07 0725) BP: 101/81 (09/07 0725) Pulse Rate: 96 (09/07 0725)  Labs: Recent Labs    07/24/18 0245 07/24/18 0937 07/24/18 1119 07/24/18 1623 07/25/18 0227  HGB 12.3*  --  12.7*  --  12.6*  HCT 38.2*  --  39.1  --  38.9*  PLT 152  --  162  --  165  APTT  --   --   --  37*  --   LABPROT  --   --   --  15.5* 16.1*  INR  --   --   --  1.24 1.31  HEPARINUNFRC 0.28* 0.34  --   --  0.29*  CREATININE 1.02  --  0.95  --  0.94    Estimated Creatinine Clearance: 80.5 mL/min (by C-G formula based on SCr of 0.94 mg/dL).   Assessment: 57 y.o. male with RLE DVT for heparin. No anticoagulation PTA.   Heparin level is slightly subtherapeutic this morning at 0.29, on 1350 units/hr. Hgb stable. No s/sx of bleeding or issues with infusion reported by nursing.   Patient s/p RHC 9/6, original discussion was to restart apixaban, but plans have changed. Restart heparin in anticipation of possible LVAD.   Goal of Therapy:  Heparin level 0.3-0.7 units/ml Monitor platelets by anticoagulation protocol: Yes   Plan:  Increase heparin infusion to 1400 units/hr  Check anti-Xa level in 6 hours and daily while on heparin Continue to monitor H&H and platelets   Marcelino Freestone, PharmD PGY2 Cardiology Pharmacy Resident Phone [phone] 07/25/2018 10:48 AM

## 2018-07-26 ENCOUNTER — Inpatient Hospital Stay (HOSPITAL_COMMUNITY): Payer: Medicare Other

## 2018-07-26 DIAGNOSIS — E119 Type 2 diabetes mellitus without complications: Secondary | ICD-10-CM

## 2018-07-26 DIAGNOSIS — I509 Heart failure, unspecified: Secondary | ICD-10-CM

## 2018-07-26 DIAGNOSIS — I5023 Acute on chronic systolic (congestive) heart failure: Secondary | ICD-10-CM

## 2018-07-26 LAB — CBC
HEMATOCRIT: 41.1 % (ref 39.0–52.0)
HEMOGLOBIN: 13.4 g/dL (ref 13.0–17.0)
MCH: 28.2 pg (ref 26.0–34.0)
MCHC: 32.6 g/dL (ref 30.0–36.0)
MCV: 86.5 fL (ref 78.0–100.0)
Platelets: 169 10*3/uL (ref 150–400)
RBC: 4.75 MIL/uL (ref 4.22–5.81)
RDW: 15.3 % (ref 11.5–15.5)
WBC: 6 10*3/uL (ref 4.0–10.5)

## 2018-07-26 LAB — HEPARIN LEVEL (UNFRACTIONATED): Heparin Unfractionated: 0.45 IU/mL (ref 0.30–0.70)

## 2018-07-26 LAB — BASIC METABOLIC PANEL
ANION GAP: 10 (ref 5–15)
BUN: 16 mg/dL (ref 6–20)
CHLORIDE: 100 mmol/L (ref 98–111)
CO2: 30 mmol/L (ref 22–32)
Calcium: 9.3 mg/dL (ref 8.9–10.3)
Creatinine, Ser: 1 mg/dL (ref 0.61–1.24)
GFR calc Af Amer: >60 mL/min (ref >60)
GFR calc non Af Amer: >60 mL/min (ref >60)
Glucose, Bld: 106 mg/dL — ABNORMAL HIGH (ref 70–99)
POTASSIUM: 3.9 mmol/L (ref 3.5–5.1)
Sodium: 140 mmol/L (ref 135–145)

## 2018-07-26 LAB — DIGOXIN LEVEL: Digoxin Level: 0.2 ng/mL — ABNORMAL LOW (ref 0.8–2.0)

## 2018-07-26 LAB — COOXEMETRY PANEL
Carboxyhemoglobin: 1.9 % — ABNORMAL HIGH (ref 0.5–1.5)
Methemoglobin: 0.6 % (ref 0.0–1.5)
O2 Saturation: 75.8 %
TOTAL HEMOGLOBIN: 13.3 g/dL (ref 12.0–16.0)

## 2018-07-26 LAB — GLUCOSE, CAPILLARY
GLUCOSE-CAPILLARY: 104 mg/dL — AB (ref 70–99)
Glucose-Capillary: 94 mg/dL (ref 70–99)
Glucose-Capillary: 115 mg/dL — ABNORMAL HIGH (ref 70–99)

## 2018-07-26 MED ORDER — TORSEMIDE 20 MG PO TABS: 40.0000 mg | ORAL_TABLET | Freq: Every day | ORAL | Status: DC

## 2018-07-26 MED ORDER — TORSEMIDE 20 MG PO TABS
40.0000 mg | ORAL_TABLET | Freq: Every day | ORAL | Status: DC
  Administered 2018-07-27: 40 mg via ORAL
  Filled 2018-07-26: qty 2

## 2018-07-26 NOTE — Progress Notes (Addendum)
Subjective:  [name] reports doing well overnight. He has been researching the LVAD and looking forward to discussion with CT Surgeon. He reports walking yesterday without difficulties. Denied nausea, vomiting, chest pain or dyspnea.   Objective:  Vital signs in last 24 hours: Vitals:   07/25/18 1944 07/25/18 2236 07/25/18 2300 07/26/18 0319  BP: (!) 89/71 97/64  93/72  Pulse:  91  (!) 56  Resp: (!) 22 (!) 24 (!) 25 19  Temp: 98.2 F (36.8 C) 98.3 F (36.8 C)  98.3 F (36.8 C)  TempSrc: Oral Oral  Oral  SpO2: 100% 97%  (!) 89%  Weight:    63.2 kg  Height:       Weight change: -4.1 kg  Intake/Output Summary (Last 24 hours) at 07/26/2018 7619 Last data filed at 07/26/2018 0510 Gross per 24 hour  Intake 1611.71 ml  Output 5401 ml  Net -3789.29 ml   Physical Exam General: Pleasant mood Neck: No JVD, trachea midline Heart : RRR, no m/r/g Lungs: normal effort, no rales, wheezes or rhonchi  Ext: No edema  Assessment/Plan:  Principal Problem:   CHF (congestive heart failure) (HCC) Active Problems:   Tobacco use   Congestive heart failure (HCC)   Type 2 diabetes mellitus (HCC)   Hyperlipidemia   Ascites   Malnutrition of moderate degree  Patient Profile: [name] is a 57yo M with DM2,HTN, HLD, COPD, who presents with worsening of severe biventricular heart failure. Patient presented with PND,orthopnea, and LE edema that has worsened over the past 2-3 weeks. He is feeling better with diuresisand new milrinone. Netnegative 12 Lsinceadmission.Co-ox improved on milrinone.C-MRI on 9/5 without signs of infiltrative disease.  RHC on 9/6 showed good cardiac output and cardiology discussing possible LVAD placement with entire team early next week.  Assessment and Plan: Severe biventricular CHF 2/2 NICM C-MRI on 09/05 without signs of infiltrative disease. HF cards believes patient will likely be milrinone dependent but discussion for LVAD underway and  family/patient appear amenable. Will still need to consider evaluation for transplant. - Following cardiology-heart failure recommendations - Patient wearing Lifevest at home - Continue digoxin0.125. Milrinone .375, ASA 81mg , losartan 12.5mg  BID, spirolactone 25mg  -3.7L out yesterday. Optivolemic on exam. Stop torsemide 60 mg today. Restart 40mg  daily tomorrow as per Heart Failure  RLE DVT Right peroneal DVT. Plan for 6 months anticoagulation. -Continue heparin gtt for now with possible LVAD placement in mind early next week.  Cirrhosis  RUQ [name] 9/3 shows cirrhotic changes.Heavy alcohol use off and on for many years. He has quit drinking for 4 months now. Hepatitis panel neg. Likely worsened 2/2 congestive heart failure. Neg hep B antibody. INR 1.2.  - will need immunization of hep B in future  DM2 CBGs controlled.Hgb A1c 6.5%. Holding home Metformin 500 mg BID.  - HH/Carb mod diet - Sensitive sling-scale insulin but CBG's ~90's  Anxiety - Continue home Cymbalta (also for chronic low back pain) - Continue home PRN Hydroxyzine 25mg  TID   COPD :From record, PFT's 5 years ago at [facility]. PFT's ordered  Dispo: Pending LVAD workup . Patientlikely dependent onmilrinone and will need discharge to transplant center.  Diet: HH/Carb mod DVT PPx: Heparin gtt IVF: None Code: Full Theodore Welch, Medical Student 07/26/2018, 7:13 AM   Attestation for Student Documentation:  I personally was present and performed or re-performed the history, physical exam and medical decision-making activities of this service and have verified that the service and findings are accurately documented in the student's  noteLanelle Bal, MD 07/26/2018, 10:20 AM

## 2018-07-26 NOTE — Progress Notes (Signed)
ANTICOAGULATION CONSULT NOTE  Pharmacy Consult for heparin Indication: DVT  No Known Allergies  Patient Measurements: Height: 5\' 10"  (177.8 cm) Weight: 139 lb 5.3 oz (63.2 kg) IBW/kg (Calculated) : 73 Heparin Dosing Weight: 67.7 kg  Vital Signs: Temp: 97.9 F (36.6 C) (09/08 0752) Temp Source: Oral (09/08 0752) BP: 86/72 (09/08 0752) Pulse Rate: 56 (09/08 0319)  Labs: Recent Labs    07/24/18 1119 07/24/18 1623 07/25/18 0227 07/25/18 1631 07/26/18 0308 07/26/18 0856  HGB 12.7*  --  12.6*  --  13.4  --   HCT 39.1  --  38.9*  --  41.1  --   PLT 162  --  165  --  169  --   APTT  --  37*  --   --   --   --   LABPROT  --  15.5* 16.1*  --   --   --   INR  --  1.24 1.31  --   --   --   HEPARINUNFRC  --   --  0.29* 0.49  --  0.45  CREATININE 0.95  --  0.94  --  1.00  --     Estimated Creatinine Clearance: 71.1 mL/min (by C-G formula based on SCr of 1 mg/dL).   Assessment: 57 y.o. male with RLE DVT for heparin. No anticoagulation PTA.   Patient s/p RHC 9/6, original discussion was to restart apixaban, but plans have changed. Restart heparin in anticipation of possible LVAD.  Heparin level is therapeutic this morning at 0.45, on 1400 units/hr. Hgb stable. No s/sx of bleeding or issues with infusion reported by nursing. Heparin drip was turned off at some point last night, level was checked later this morning within appropriate time frame.    Goal of Therapy:  Heparin level 0.3-0.7 units/ml Monitor platelets by anticoagulation protocol: Yes   Plan:  Continue heparin infusion to 1400 units/hr  Check anti-Xa level daily while on heparin Continue to monitor H&H and platelets   Marcelino Freestone, PharmD PGY2 Cardiology Pharmacy Resident Phone [phone] 07/26/2018 10:37 AM

## 2018-07-26 NOTE — Progress Notes (Signed)
  Date: 07/26/2018  Patient name: AMOD LIQUORI  Medical record number: [id number]  Date of birth: 1961-02-20   This patient's plan of care was discussed with the house staff. Please see their note for complete details. I concur with their findings.   Inez Catalina, MD 07/26/2018, 7:53 PM

## 2018-07-26 NOTE — Progress Notes (Signed)
Pre-op Cardiac Surgery  Carotid Findings:  Bilateral - 1% to 39% ICA stenosis. Vertebral artey flow is antegrade.  Upper Extremity Right Left  Brachial Pressures PICC line Triphasic 85 Triphasic       Lower  Extremity Right Left  Dorsalis Pedis 94 Biphasic 77 Biphasic  Posterior Tibial 94 Triphasic 81 Biphaic  Ankle/ Brachial index - Toe/Brachial index 1.11 / 1.61  0.95 / 0.74    Findings:  ABIs and Doppler waveforms are within normal limits bilaterally. TBIs indicate normal perfusion bilaterally.  Leta Jungling. RDCS [employer]  07/26/2018 4:32 PM

## 2018-07-26 NOTE — Consult Note (Signed)
[address]       [place], [address]             [phone]      Cardiothoracic Surgery Consultation   Reason for Consult: Nonischemic cardiomyopathy with acute on chronic biventricular systolic congestive heart failure  Referring Physician: Dr. Loralie Champagne  Theodore Welch is an 57 y.o. male.  HPI:   The patient is a 57 year old gentleman with history of type 2 diabetes, hyperlipidemia, obstructive sleep apnea on CPAP, tobacco abuse until recently, and chronic biventricular congestive heart failure with ejection fraction of 20 to 25% followed at [facility].  The patient reportedly had a right and left heart cath at [facility] on 06/08/2018 showing no obstructive coronary disease but markedly elevated filling pressures and low cardiac output.  He has been in a LifeVest due to frequent NSVT.  He has been seen at [facility] on 07/09/2018 and his Lasix was increased from 80 mg twice daily to 100 twice daily and then to 120 mg twice daily.  He said that his lower extremity edema improved but his breathing continued to worsen. The patient presented to the [facility] emergency department on 07/21/2018 with a 2 to 3-week history of worsening lower extremity edema, orthopnea, and PND with acute worsening over several days prior to presentation.  A 2D echocardiogram showed an ejection fraction of 10 to 20% with severe diffuse hypokinesis.  There is grade 3 diastolic dysfunction.  There is moderate to severe MR.  There is severe right ventricular dilation with severe TR.  There is moderate to severe pulmonary insufficiency.  There is marked dilatation of the left and right atrium.  He had a CT Angio of the chest on 07/21/2018 that showed no evidence of pulmonary embolism.  There were slightly irregular margins to the liver suggesting cirrhosis as well as a small amount of ascites.  A PICC line was placed and his initial co-ox was 43%.  He was started on milrinone 0.25 which was  increased to 0.375 due to persistently low Co-ox.  His Co-ox today was 76% on milrinone 0.375.  A cardiac MRI on 07/23/2018 showed a left ventricular ejection fraction of 14% with diffuse severe hypokinesis.  There was severe dilation of the RV with moderate to severe systolic dysfunction with ejection fraction 25%.  Right heart catheterization yesterday showed:  RHC Procedural Findings (9/7): Hemodynamics (on milrinone 0.375 mcg/kg/min) RA mean 8 RV 40/12 PA 40/19, mean 27 PCWP mean 12 CVP/PCWP 0.66 PAPi 2.65 Oxygen saturations: PA 70% AO 95% Cardiac Output (Fick) 5.84  Cardiac Index (Fick) 3.18 PVR 2.6 WU (Fick) CardiacOutput (Thermo) 4.97 Cardiac Index (Themo) 2.71  PVR 3 WU (Thermo)  He was diuresed about 5 L over the previous 2 days and his CVP this morning was 2-3.  He said that he feels much better and breathing well.  He did develop some pain in his right lower leg and on ultrasound showed a right peroneal DVT.  He has been placed on heparin.  Past Medical History:  Diagnosis Date  . Cardiomyopathy, unspecified ([place])   . CHF (congestive heart failure) ([place])   . Chronic back pain   . Enlarged heart   . Gastric ulcer   . Gastroenteritis   . H/O degenerative disc disease     Past Surgical History:  Procedure Laterality Date  . LUMBAR LAMINECTOMY/DECOMPRESSION MICRODISCECTOMY    . ORCHIECTOMY      History reviewed. No pertinent  family history.  Social History:  reports that he has been smoking cigarettes. He started smoking about 16 years ago. He has never used smokeless tobacco. He reports that he drank alcohol. He reports that he has current or past drug history.  Allergies: No Known Allergies  Medications:  I have reviewed the patient's current medications. Prior to Admission:  Medications Prior to Admission  Medication Sig Dispense Refill Last Dose  . aspirin EC 81 MG tablet Take 81 mg by mouth daily.   07/20/2018 at Unknown time  . atorvastatin (LIPITOR) 20 MG  tablet Take 10 mg by mouth daily.    07/20/2018 at Unknown time  . carbamide peroxide (DEBROX) 6.5 % otic solution Place 5 drops into both ears 3 (three) times daily. 5-10 drops   07/20/2018 at Unknown time  . clotrimazole (LOTRIMIN) 1 % external solution Apply 1 application topically daily.   07/20/2018 at Unknown time  . cyanocobalamin 500 MCG tablet Take 500 mcg by mouth daily.   07/20/2018 at Unknown time  . DULoxetine (CYMBALTA) 60 MG capsule Take 60 mg by mouth daily.   07/20/2018 at Unknown time  . fluticasone (FLONASE) 50 MCG/ACT nasal spray Place 1 spray into both nostrils 2 (two) times daily as needed for allergies.    unk  . furosemide (LASIX) 80 MG tablet Take 100 mg by mouth 2 (two) times daily.   0 07/20/2018 at Unknown time  . gabapentin (NEURONTIN) 300 MG capsule Take 600 mg by mouth 3 (three) times daily.   07/20/2018 at Unknown time  . hydrOXYzine (VISTARIL) 25 MG capsule Take 25 mg by mouth 3 (three) times daily as needed for anxiety.   06/19/2018 at Unknown time  . Ipratropium-Albuterol (COMBIVENT) 20-100 MCG/ACT AERS respimat Inhale 1 puff into the lungs every 6 (six) hours.   07/20/2018 at Unknown time  . lidocaine (LIDODERM) 5 % Place 1 patch onto the skin daily. Remove & Discard patch within 12 hours or as directed by MD   07/20/2018 at Unknown time  . metFORMIN (GLUCOPHAGE-XR) 500 MG 24 hr tablet Take 500 mg by mouth 2 (two) times daily.   07/20/2018 at Unknown time  . methocarbamol (ROBAXIN) 750 MG tablet Take 750 mg by mouth 3 (three) times daily as needed for muscle spasms.    unk  . omeprazole (PRILOSEC) 20 MG capsule Take 20 mg by mouth daily.   07/20/2018 at Unknown time  . polyethylene glycol (MIRALAX / GLYCOLAX) packet Take 17 g by mouth daily as needed for mild constipation.   unk  . sildenafil (VIAGRA) 100 MG tablet Take 50 mg by mouth daily as needed for erectile dysfunction.   unk  . thiamine 100 MG tablet Take 100 mg by mouth daily.   07/20/2018 at Unknown time   Scheduled: . aspirin  EC  81 mg Oral Daily  . atorvastatin  10 mg Oral Daily  . digoxin  0.125 mg Oral Daily  . DULoxetine  60 mg Oral Daily  . feeding supplement (ENSURE ENLIVE)  237 mL Oral BID BM  . gabapentin  600 mg Oral TID  . insulin aspart  0-9 Units Subcutaneous TID WC  . losartan  12.5 mg Oral BID  . pantoprazole  40 mg Oral Daily  . sodium chloride flush  10-40 mL Intracatheter Q12H  . sodium chloride flush  3 mL Intravenous Q12H  . sodium chloride flush  3 mL Intravenous Q12H  . spironolactone  25 mg Oral Daily  . thiamine  100 mg Oral Daily  . [START ON 07/27/2018] torsemide  40 mg Oral Daily   Continuous: . sodium chloride Stopped (07/25/18 2100)  . heparin 1,400 Units/hr (07/26/18 0510)  . milrinone 0.375 mcg/kg/min (07/26/18 0509)   ZYS:AYTKZS chloride, acetaminophen **OR** acetaminophen, hydrOXYzine, ipratropium-albuterol, methocarbamol, nicotine, ondansetron (ZOFRAN) IV, senna-docusate, sodium chloride flush, sodium chloride flush Anti-infectives (From admission, onward)   None      Results for orders placed or performed during the hospital encounter of 07/21/18 (from the past 48 hour(s))  Prealbumin     Status: Abnormal   Collection Time: 07/24/18  4:23 PM  Result Value Ref Range   Prealbumin 10.7 (L) 18 - 38 mg/dL    Comment: Performed at [facility] [address] [place], [place] [address]  Antithrombin III     Status: Abnormal   Collection Time: 07/24/18  4:23 PM  Result Value Ref Range   AntiThromb III Func 67 (L) 75 - 120 %    Comment: Performed at [facility] [address], [place], [place] [address]  Lupus anticoagulant panel     Status: None   Collection Time: 07/24/18  4:23 PM  Result Value Ref Range   PTT Lupus Anticoagulant 40.4 0.0 - 51.9 sec   DRVVT 32.8 0.0 - 47.0 sec   Lupus Anticoag Interp Comment:     Comment: (NOTE) No lupus anticoagulant was detected. Performed At: [facility] [place], [place] [id number] Rush Farmer MD CW:[id number]   T4, free     Status: None   Collection Time: 07/24/18  4:23 PM  Result Value Ref Range   Free T4 1.15 0.82 - 1.77 ng/dL    Comment: (NOTE) Biotin ingestion may interfere with free T4 tests. If the results are inconsistent with the TSH level, previous test results, or the clinical presentation, then consider biotin interference. If needed, order repeat testing after stopping biotin. Performed at [facility], [place] [address] [place], [place] [address]   TSH     Status: None   Collection Time: 07/24/18  4:23 PM  Result Value Ref Range   TSH 0.880 0.350 - 4.500 uIU/mL    Comment: Performed by a 3rd Generation assay with a functional sensitivity of <=0.01 uIU/mL. Performed at [facility], [place] [address] [place], [place] [address]   Uric acid     Status: Abnormal   Collection Time: 07/24/18  4:23 PM  Result Value Ref Range   Uric Acid, Serum 9.4 (H) 3.7 - 8.6 mg/dL    Comment: Performed at [facility] [address] [place], [place] [address]  Lactate dehydrogenase     Status: None   Collection Time: 07/24/18  4:23 PM  Result Value Ref Range   LDH 180 98 - 192 U/L    Comment: Performed at [facility], [place] [address] [place], [place] [address]  Protime-INR     Status: Abnormal   Collection Time: 07/24/18  4:23 PM  Result Value Ref Range   Prothrombin Time 15.5 (H) 11.4 - 15.2 seconds   INR 1.24     Comment: Performed at [facility] [address] [place], [place] [address]  APTT     Status: Abnormal   Collection Time: 07/24/18  4:23 PM  Result Value Ref Range   aPTT 37 (H) 24 - 36 seconds    Comment:        IF BASELINE aPTT  IS ELEVATED, SUGGEST PATIENT RISK ASSESSMENT BE USED TO DETERMINE APPROPRIATE ANTICOAGULANT THERAPY. Performed at [facility], [place] [address], [place], [place] [address]   Glucose, capillary     Status: Abnormal   Collection Time: 07/24/18  4:43 PM  Result Value Ref Range    Glucose-Capillary 196 (H) 70 - 99 mg/dL  Glucose, capillary     Status: None   Collection Time: 07/24/18  9:08 PM  Result Value Ref Range   Glucose-Capillary 97 70 - 99 mg/dL  Rapid urine drug screen (hospital performed)     Status: Abnormal   Collection Time: 07/24/18 11:52 PM  Result Value Ref Range   Opiates NONE DETECTED NONE DETECTED   Cocaine NONE DETECTED NONE DETECTED   Benzodiazepines POSITIVE (A) NONE DETECTED   Amphetamines NONE DETECTED NONE DETECTED   Tetrahydrocannabinol POSITIVE (A) NONE DETECTED   Barbiturates NONE DETECTED NONE DETECTED    Comment: (NOTE) DRUG SCREEN FOR MEDICAL PURPOSES ONLY.  IF CONFIRMATION IS NEEDED FOR ANY PURPOSE, NOTIFY LAB WITHIN 5 DAYS. LOWEST DETECTABLE LIMITS FOR URINE DRUG SCREEN Drug Class                     Cutoff (ng/mL) Amphetamine and metabolites    1000 Barbiturate and metabolites    200 Benzodiazepine                 191 Tricyclics and metabolites     300 Opiates and metabolites        300 Cocaine and metabolites        300 THC                            50 Performed at [facility], [place] [address] [place], [place] [address]   Urinalysis, Routine w reflex microscopic     Status: None   Collection Time: 07/24/18 11:52 PM  Result Value Ref Range   Color, Urine YELLOW YELLOW   APPearance CLEAR CLEAR   Specific Gravity, Urine 1.020 1.005 - 1.030   pH 6.0 5.0 - 8.0   Glucose, UA NEGATIVE NEGATIVE mg/dL   Hgb urine dipstick NEGATIVE NEGATIVE   Bilirubin Urine NEGATIVE NEGATIVE   Ketones, ur NEGATIVE NEGATIVE mg/dL   Protein, ur NEGATIVE NEGATIVE mg/dL   Nitrite NEGATIVE NEGATIVE   Leukocytes, UA NEGATIVE NEGATIVE    Comment: Performed at [facility] [address], [place], [place] [address]  Basic metabolic panel     Status: Abnormal   Collection Time: 07/25/18  2:27 AM  Result Value Ref Range   Sodium 138 135 - 145 mmol/L   Potassium 4.6 3.5 - 5.1 mmol/L   Chloride 105 98 - 111 mmol/L   CO2 25 22 -  32 mmol/L   Glucose, Bld 98 70 - 99 mg/dL   BUN 15 6 - 20 mg/dL   Creatinine, Ser 0.94 0.61 - 1.24 mg/dL   Calcium 8.8 (L) 8.9 - 10.3 mg/dL   GFR calc non Af Amer >60 >60 mL/min   GFR calc Af Amer >60 >60 mL/min    Comment: (NOTE) The eGFR has been calculated using the CKD EPI equation. This calculation has not been validated in all clinical situations. eGFR's persistently <60 mL/min signify possible Chronic Kidney Disease.    Anion gap 8 5 - 15    Comment: Performed at [facility] [address], [place], [place] [address]  CBC     Status: Abnormal   Collection Time: 07/25/18  2:27 AM  Result Value Ref Range   WBC 6.7 4.0 - 10.5 K/uL   RBC 4.46 4.22 - 5.81 MIL/uL   Hemoglobin 12.6 (L) 13.0 - 17.0 g/dL   HCT 38.9 (L) 39.0 - 52.0 %   MCV 87.2 78.0 - 100.0 fL   MCH 28.3 26.0 - 34.0 pg   MCHC 32.4 30.0 - 36.0 g/dL   RDW 15.6 (H) 11.5 - 15.5 %   Platelets 165 150 - 400 K/uL    Comment: Performed at [facility] [address] [place], [place] [address]  Magnesium     Status: None   Collection Time: 07/25/18  2:27 AM  Result Value Ref Range   Magnesium 2.0 1.7 - 2.4 mg/dL    Comment: Performed at [facility] [address], [place], [place] [address]  Protime-INR     Status: Abnormal   Collection Time: 07/25/18  2:27 AM  Result Value Ref Range   Prothrombin Time 16.1 (H) 11.4 - 15.2 seconds   INR 1.31     Comment: Performed at [facility] [address] [place], [place] [address]  Heparin level (unfractionated)     Status: Abnormal   Collection Time: 07/25/18  2:27 AM  Result Value Ref Range   Heparin Unfractionated 0.29 (L) 0.30 - 0.70 IU/mL    Comment: (NOTE) If heparin results are below expected values, and patient dosage has  been confirmed, suggest follow up testing of antithrombin III levels. Performed at [facility], [place] [address], [place], [place] [address]   .Cooxemetry Panel (carboxy, met, total hgb, O2 sat)     Status:  Abnormal   Collection Time: 07/25/18  5:10 AM  Result Value Ref Range   Total hemoglobin 12.2 12.0 - 16.0 g/dL   O2 Saturation 75.1 %   Carboxyhemoglobin 1.9 (H) 0.5 - 1.5 %   Methemoglobin 0.9 0.0 - 1.5 %  Glucose, capillary     Status: None   Collection Time: 07/25/18  8:10 AM  Result Value Ref Range   Glucose-Capillary 90 70 - 99 mg/dL  Glucose, capillary     Status: None   Collection Time: 07/25/18 11:35 AM  Result Value Ref Range   Glucose-Capillary 98 70 - 99 mg/dL  Heparin level (unfractionated)     Status: None   Collection Time: 07/25/18  4:31 PM  Result Value Ref Range   Heparin Unfractionated 0.49 0.30 - 0.70 IU/mL    Comment: (NOTE) If heparin results are below expected values, and patient dosage has  been confirmed, suggest follow up testing of antithrombin III levels. Performed at [facility], [place] [address] [place], [place] [address]   Glucose, capillary     Status: Abnormal   Collection Time: 07/25/18  4:50 PM  Result Value Ref Range   Glucose-Capillary 101 (H) 70 - 99 mg/dL  Glucose, capillary     Status: Abnormal   Collection Time: 07/25/18  8:55 PM  Result Value Ref Range   Glucose-Capillary 108 (H) 70 - 99 mg/dL  Basic metabolic panel     Status: Abnormal   Collection Time: 07/26/18  3:08 AM  Result Value Ref Range   Sodium 140 135 - 145 mmol/L   Potassium 3.9 3.5 - 5.1 mmol/L   Chloride 100 98 - 111 mmol/L   CO2 30 22 - 32 mmol/L   Glucose, Bld 106 (  H) 70 - 99 mg/dL   BUN 16 6 - 20 mg/dL   Creatinine, Ser 1.00 0.61 - 1.24 mg/dL   Calcium 9.3 8.9 - 10.3 mg/dL   GFR calc non Af Amer >60 >60 mL/min   GFR calc Af Amer >60 >60 mL/min    Comment: (NOTE) The eGFR has been calculated using the CKD EPI equation. This calculation has not been validated in all clinical situations. eGFR's persistently <60 mL/min signify possible Chronic Kidney Disease.    Anion gap 10 5 - 15    Comment: Performed at [facility] [address]  [place] [address]  CBC     Status: None   Collection Time: 07/26/18  3:08 AM  Result Value Ref Range   WBC 6.0 4.0 - 10.5 K/uL   RBC 4.75 4.22 - 5.81 MIL/uL   Hemoglobin 13.4 13.0 - 17.0 g/dL   HCT 41.1 39.0 - 52.0 %   MCV 86.5 78.0 - 100.0 fL   MCH 28.2 26.0 - 34.0 pg   MCHC 32.6 30.0 - 36.0 g/dL   RDW 15.3 11.5 - 15.5 %   Platelets 169 150 - 400 K/uL    Comment: Performed at [facility], [place] [address] [place], [place] [address]  Digoxin level     Status: Abnormal   Collection Time: 07/26/18  3:08 AM  Result Value Ref Range   Digoxin Level 0.2 (L) 0.8 - 2.0 ng/mL    Comment: Performed at [facility] [address] [place], [place] [address]  .Cooxemetry Panel (carboxy, met, total hgb, O2 sat)     Status: Abnormal   Collection Time: 07/26/18  3:38 AM  Result Value Ref Range   Total hemoglobin 13.3 12.0 - 16.0 g/dL   O2 Saturation 75.8 %   Carboxyhemoglobin 1.9 (H) 0.5 - 1.5 %   Methemoglobin 0.6 0.0 - 1.5 %  Glucose, capillary     Status: None   Collection Time: 07/26/18  7:43 AM  Result Value Ref Range   Glucose-Capillary 94 70 - 99 mg/dL  Heparin level (unfractionated)     Status: None   Collection Time: 07/26/18  8:56 AM  Result Value Ref Range   Heparin Unfractionated 0.45 0.30 - 0.70 IU/mL    Comment: (NOTE) If heparin results are below expected values, and patient dosage has  been confirmed, suggest follow up testing of antithrombin III levels. Performed at [facility], [place] [address] [place], [place] [address]   Glucose, capillary     Status: Abnormal   Collection Time: 07/26/18 11:08 AM  Result Value Ref Range   Glucose-Capillary 115 (H) 70 - 99 mg/dL    Dg Orthopantogram  Result Date: 07/24/2018 CLINICAL DATA:  Preoperative evaluation prior to a VAD placement. EXAM: ORTHOPANTOGRAM/PANORAMIC COMPARISON:  None. FINDINGS: Bilateral upper molar metallic fillings. The mid lower teeth are either missing and replaced with a prosthesis or  obscured by artifact. No dental caries seen. IMPRESSION: No visible dental cavities. Electronically Signed   By: Claudie Revering M.D.   On: 07/24/2018 23:12   Ct Chest W Contrast  Result Date: 07/24/2018 CLINICAL DATA:  VAD candidacy evaluation. EXAM: CT CHEST, ABDOMEN, AND PELVIS WITH CONTRAST TECHNIQUE: Multidetector CT imaging of the chest, abdomen and pelvis was performed following the standard protocol during bolus administration of intravenous contrast. CONTRAST:  156m ISOVUE-300 IOPAMIDOL (ISOVUE-300) INJECTION 61% COMPARISON:  Cardiac MRI dated 07/23/2018 and chest CTA dated 07/21/2018. Abdomen and pelvis  CT dated 05/03/2008. FINDINGS: CT CHEST FINDINGS Cardiovascular: Diffusely enlarged heart and vomiting all 4 chambers. No pericardial effusion. Mild aortic and proximal coronary artery calcification. Mildly enlarged main pulmonary artery with a transverse diameter of 3.2 cm on image number 28 series 3. Normal sized aorta. Mediastinum/Nodes: Mildly enlarged mediastinal nodes without significant change. The previously demonstrated 1.1 cm short axis right anterior paratracheal node has a short axis diameter of 1.0 cm today on image number 22 series 3. Normal appearing thyroid gland. Lungs/Pleura: Interval minimal bilateral pleural fluid. Mild bibasilar atelectasis, increased. Musculoskeletal: Unremarkable bones. CT ABDOMEN PELVIS FINDINGS Hepatobiliary: Heterogeneous liver with an enlarged lateral segment left lobe and caudate lobe and somewhat small right lobe. Pancreas: Unremarkable. No pancreatic ductal dilatation or surrounding inflammatory changes. 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  The spleen is normal in size. Adrenals/Urinary Tract: Normal appearing adrenal glands. Bilateral renal cysts. Unremarkable bladder and ureters. Stomach/Bowel: Stomach is within normal limits. Appendix appears normal. No evidence of bowel wall thickening, distention, or inflammatory changes. Vascular/Lymphatic: Atheromatous  aortic and iliac artery calcifications without aneurysm. No enlarged lymph nodes. Reproductive: Mildly enlarged prostate gland. Other: Small to moderate amount of free peritoneal fluid with mild improvement since 07/21/2018. Musculoskeletal: Lumbar spine degenerative changes. IMPRESSION: 1. Cardiomegaly involving all 4 chambers. 2. Mildly enlarged main pulmonary artery, possibly due to pulmonary arterial hypertension. 3. Interval minimal bilateral pleural fluid. 4. Mild bibasilar atelectasis, increased. 5. Stable mild mediastinal adenopathy. 6. Heterogeneous liver with findings compatible with cirrhosis. 7. Small to moderate amount of ascites, improved. 8. Mild prostatic hypertrophy. Electronically Signed   By: Claudie Revering M.D.   On: 07/24/2018 22:43   Ct Abdomen Pelvis W Contrast  Result Date: 07/24/2018 CLINICAL DATA:  VAD candidacy evaluation. EXAM: CT CHEST, ABDOMEN, AND PELVIS WITH CONTRAST TECHNIQUE: Multidetector CT imaging of the chest, abdomen and pelvis was performed following the standard protocol during bolus administration of intravenous contrast. CONTRAST:  159m ISOVUE-300 IOPAMIDOL (ISOVUE-300) INJECTION 61% COMPARISON:  Cardiac MRI dated 07/23/2018 and chest CTA dated 07/21/2018. Abdomen and pelvis CT dated 05/03/2008. FINDINGS: CT CHEST FINDINGS Cardiovascular: Diffusely enlarged heart and vomiting all 4 chambers. No pericardial effusion. Mild aortic and proximal coronary artery calcification. Mildly enlarged main pulmonary artery with a transverse diameter of 3.2 cm on image number 28 series 3. Normal sized aorta. Mediastinum/Nodes: Mildly enlarged mediastinal nodes without significant change. The previously demonstrated 1.1 cm short axis right anterior paratracheal node has a short axis diameter of 1.0 cm today on image number 22 series 3. Normal appearing thyroid gland. Lungs/Pleura: Interval minimal bilateral pleural fluid. Mild bibasilar atelectasis, increased. Musculoskeletal:  Unremarkable bones. CT ABDOMEN PELVIS FINDINGS Hepatobiliary: Heterogeneous liver with an enlarged lateral segment left lobe and caudate lobe and somewhat small right lobe. Pancreas: Unremarkable. No pancreatic ductal dilatation or surrounding inflammatory changes. 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  The spleen is normal in size. Adrenals/Urinary Tract: Normal appearing adrenal glands. Bilateral renal cysts. Unremarkable bladder and ureters. Stomach/Bowel: Stomach is within normal limits. Appendix appears normal. No evidence of bowel wall thickening, distention, or inflammatory changes. Vascular/Lymphatic: Atheromatous aortic and iliac artery calcifications without aneurysm. No enlarged lymph nodes. Reproductive: Mildly enlarged prostate gland. Other: Small to moderate amount of free peritoneal fluid with mild improvement since 07/21/2018. Musculoskeletal: Lumbar spine degenerative changes. IMPRESSION: 1. Cardiomegaly involving all 4 chambers. 2. Mildly enlarged main pulmonary artery, possibly due to pulmonary arterial hypertension. 3. Interval minimal bilateral pleural fluid. 4. Mild bibasilar atelectasis, increased. 5. Stable mild mediastinal adenopathy. 6.  Heterogeneous liver with findings compatible with cirrhosis. 7. Small to moderate amount of ascites, improved. 8. Mild prostatic hypertrophy. Electronically Signed   By: Claudie Revering M.D.   On: 07/24/2018 22:43    Review of Systems  Constitutional: Positive for malaise/fatigue. Negative for chills and fever.  HENT:       No pain in teeth or loose teeth.  Eyes: Negative.   Respiratory: Positive for cough and shortness of breath.   Cardiovascular: Positive for orthopnea, leg swelling and PND. Negative for chest pain.  Gastrointestinal: Negative.   Genitourinary: Negative.   Musculoskeletal: Positive for joint pain.  Skin: Negative.   Neurological: Negative for dizziness and loss of consciousness ( ).  Endo/Heme/Allergies: Negative.     Psychiatric/Behavioral: Negative.    Blood pressure (!) 86/61, pulse (!) 56, temperature 98.3 F (36.8 C), temperature source Oral, resp. rate 19, height _0  (1.778 m), weight 63.2 kg, SpO2 (!) 89 %. Physical Exam  Constitutional: He is oriented to person, place, and time. He appears well-developed and well-nourished. No distress.  HENT:  Head: Normocephalic and atraumatic.  Mouth/Throat: Oropharynx is clear and moist.  Missing a couple of front teeth but other teeth appear in fair condition.  Eyes: Pupils are equal, round, and reactive to light. EOM are normal.  Neck: Normal range of motion. Neck supple. No JVD present. No thyromegaly present.  Cardiovascular: Normal rate, regular rhythm and intact distal pulses.  Murmur heard. 1/6 systolic murmur at apex  Respiratory: Effort normal and breath sounds normal. No respiratory distress. He has no wheezes. He has no rales.  GI: Soft. Bowel sounds are normal. He exhibits no distension. There is no tenderness.  Musculoskeletal: Normal range of motion. He exhibits no edema or tenderness.  Lymphadenopathy:    He has no cervical adenopathy.  Neurological: He is alert and oriented to person, place, and time. He has normal strength. No cranial nerve deficit or sensory deficit.  Skin: Skin is warm and dry.  Psychiatric: He has a normal mood and affect.   Result status: Edited Result - FINAL                              *[facility]*                         [place] [place], [place] [address]                            [phone]  ------------------------------------------------------------------- Transthoracic Echocardiography  Patient:    Bing, Duffey MR #:       [id number] Study Date: 07/21/2018 Gender:     M Age:        41 Height:     157.5 cm Weight:     67.6 kg BSA:        1.74 m^2 Pt. Status: Room:       [address]    Varney Biles [id number]  ADMITTING    Cameron Sprang  REFERRING    Sid Falcon  PERFORMING   [facility], Inpatient  REFERRING    Molt, Bethany  SONOGRAPHER  Dance, Tiffany  cc:  ------------------------------------------------------------------- LV EF: 10% -   20%  ------------------------------------------------------------------- Indications:      CHF - 428.0.  ------------------------------------------------------------------- History:   PMH:  Enlarged Heart. Cardiomyopathy.  ------------------------------------------------------------------- Study Conclusions  - Left ventricle: The cavity size was moderately to severely   dilated. Wall thickness was normal. Systolic function was   severely reduced. The estimated ejection fraction was in the   range of 10% to 20%. Severe diffuse hypokinesis. Although no   diagnostic regional wall motion abnormality was identified, this   possibility cannot be completely excluded on the basis of this   study. Doppler parameters are consistent with a reversible   restrictive pattern, indicative of decreased left ventricular   diastolic compliance and/or increased left atrial pressure (grade   3 diastolic dysfunction). - Ventricular septum: The contour showed diastolic flattening.   These changes are consistent with RV volume and pressure   overload. - Mitral valve: There was moderate to severe regurgitation. - Left atrium: The atrium was massively dilated. - Right ventricle: The cavity size was severely dilated. Wall   thickness was normal. - Right atrium: The atrium was massively dilated. - Tricuspid valve: There was malcoaptation of the valve   leaflets--they do not appear to coapt due to RV dilation. There   was severe regurgitation. - Pulmonic valve: There was moderate to severe regurgitation. - Pericardium, extracardiac: A trivial pericardial effusion was   identified. Features were not consistent with  tamponade   physiology. Ascites was noted.  Impressions:  - Severe LV dysunction. All four chambers dilated. Severe TR due to   lack of coaptation of leaflets 2/2 RV dilation. Suspect that TR   jet underestimates filling pressures. Echo consistent with RV   pressure and volume overload. Moderate-severe MR and PR as well.   Trivial pericardial effusion without tamponade. Ascites present.  ------------------------------------------------------------------- Study data:  No prior study was available for comparison.  Study status:  Routine.  Procedure:  The patient reported no pain pre or post test. Transthoracic echocardiography. Image quality was adequate.  Study completion:  There were no complications. Transthoracic echocardiography.  M-mode, complete 2D, spectral Doppler, and color Doppler.  Birthdate:  Patient birthdate: 1961-06-17.  Age:  Patient is 57 yr old.  Sex:  Gender: male. BMI: 27.2 kg/m^2.  Blood pressure:     110/82  Patient status: Inpatient.  Study date:  Study date: 07/21/2018. Study time: 11:28 AM.  Location:  Bedside.  -------------------------------------------------------------------  ------------------------------------------------------------------- Left ventricle:  The cavity size was moderately to severely dilated. Wall thickness was normal. Systolic function was severely reduced. The estimated ejection fraction was in the range of 10% to 20%.  Severe diffuse hypokinesis. Although no diagnostic regional wall motion abnormality was identified, this possibility cannot be completely excluded on the basis of this study. Doppler parameters are consistent with a reversible restrictive pattern, indicative of decreased left ventricular diastolic compliance and/or increased left atrial pressure (grade 3 diastolic dysfunction).  ------------------------------------------------------------------- Aortic valve:   Trileaflet; normal thickness leaflets. Mobility  was not restricted.  Doppler:  Transvalvular velocity was within the normal range. There was no stenosis. There was no regurgitation.   ------------------------------------------------------------------- Aorta:  Aortic root: The aortic root was normal in size.  ------------------------------------------------------------------- Mitral valve:   Structurally normal valve.   Mobility was not restricted.  Doppler:  Transvalvular velocity was within the normal range. There was no evidence for  stenosis. There was moderate to severe regurgitation.    Peak gradient (D): 4 mm Hg.  ------------------------------------------------------------------- Left atrium:  The atrium was massively dilated.  ------------------------------------------------------------------- Right ventricle:  The cavity size was severely dilated. Wall thickness was normal. Systolic function was low normal.  ------------------------------------------------------------------- Ventricular septum:   The contour showed diastolic flattening. These changes are consistent with RV volume and pressure overload.   ------------------------------------------------------------------- Pulmonic valve:    The valve appears to be grossly normal. Doppler:  Transvalvular velocity was within the normal range. There was no evidence for stenosis. There was moderate to severe regurgitation.  ------------------------------------------------------------------- Tricuspid valve:   Structurally normal valve.   There was malcoaptation of the valve leaflets--they do not appear to coapt due to RV dilation.  Doppler:  Transvalvular velocity was within the normal range. There was severe regurgitation.  ------------------------------------------------------------------- Pulmonary artery:   The main pulmonary artery was normal-sized. Systolic pressure was within the normal  range.  ------------------------------------------------------------------- Right atrium:  The atrium was massively dilated.  ------------------------------------------------------------------- Pericardium:  A trivial pericardial effusion was identified. Doppler:  Features were not consistent with tamponade physiology.   ------------------------------------------------------------------- Systemic veins: Inferior vena cava: The vessel was dilated. The respirophasic diameter changes were blunted (< 50%), consistent with elevated central venous pressure.  ------------------------------------------------------------------- Abdomen:  Ascites was noted.  ------------------------------------------------------------------- Measurements   Left ventricle                          Value        Reference  LV ID, ED, PLAX chordal         (H)     56    mm     43 - 52  LV ID, ES, PLAX chordal         (H)     51.2  mm     23 - 38  LV fx shortening, PLAX chordal  (L)     9     %      >=29  LV PW thickness, ED                     13.4  mm     ----------  IVS/LV PW ratio, ED                     0.61         <=1.3  LV e&', lateral                          6.18  cm/s   ----------  LV E/e&', lateral                        16.83        ----------  LV e&', medial                           2.81  cm/s   ----------  LV E/e&', medial                         37.01        ----------  LV e&', average                          4.5   cm/s   ----------  LV E/e&', average                        23.14        ----------    Ventricular septum                      Value        Reference  IVS thickness, ED                       8.11  mm     ----------    LVOT                                    Value        Reference  LVOT ID, S                              19    mm     ----------  LVOT area                               2.84  cm^2   ----------    Aorta                                   Value        Reference   Aortic root ID, ED                      34    mm     ----------  Ascending aorta ID, A-P, S              34    mm     ----------    Left atrium                             Value        Reference  LA ID, A-P, ES                          51    mm     ----------  LA ID/bsa, A-P                  (H)     2.93  cm/m^2 <=2.2  LA volume, S                            120   ml     ----------  LA volume/bsa, S                        69    ml/m^2 ----------  LA volume, ES, 1-p A4C                  108   ml     ----------  LA volume/bsa, ES, 1-p A4C              62.1  ml/m^2 ----------  LA volume, ES, 1-p A2C  132   ml     ----------  LA volume/bsa, ES, 1-p A2C              75.9  ml/m^2 ----------    Mitral valve                            Value        Reference  Mitral E-wave peak velocity             104   cm/s   ----------  Mitral A-wave peak velocity             40.3  cm/s   ----------  Mitral deceleration time                165   ms     150 - 230  Mitral peak gradient, D                 4     mm Hg  ----------  Mitral E/A ratio, peak                  2.6          ----------    Pulmonary arteries                      Value        Reference  PA pressure, S, DP              (H)     36    mm Hg  <=30    Tricuspid valve                         Value        Reference  Tricuspid regurg peak velocity          227   cm/s   ----------  Tricuspid peak RV-RA gradient           21    mm Hg  ----------    Right atrium                            Value        Reference  RA ID, S-I, ES, A4C             (H)     70.1  mm     34 - 49  RA area, ES, A4C                (H)     36.9  cm^2   8.3 - 19.5  RA volume, ES, A/L                      158   ml     ----------  RA volume/bsa, ES, A/L                  90.9  ml/m^2 ----------    Systemic veins                          Value        Reference  Estimated CVP                           15  mm Hg  ----------    Right ventricle                          Value        Reference  RV ID, minor axis, ED, A4C base         46.8  mm     ----------  RV ID, minor axis, ED, A4C mid          28.9  mm     ----------  RV ID, major axis, ED, A4C      (H)     93.5  mm     55 - 91  TAPSE                                   18.3  mm     ----------  RV pressure, S, DP              (H)     36    mm Hg  <=30  RV s&', lateral, S                       8.76  cm/s   ----------    Pulmonic valve                          Value        Reference  Pulmonic regurg velocity, ED            93.7  cm/s   ----------  Legend: (L)  and  (H)  mark values outside specified reference range.  ------------------------------------------------------------------- Prepared and Electronically Authenticated by  Buford Dresser 2019-09-03T12:51:56                             *[facility]*                         [address], [place] [address]                            [phone]  ------------------------------------------------------------------- Transthoracic Echocardiography  (Report amended )  Patient:    Bladen, Umar MR #:       [id number] Study Date: 07/21/2018 Gender:     M Age:        65 Height:     157.5 cm Weight:     67.6 kg BSA:        1.74 m^2 Pt. Status: Room:       3E14C   ATTENDING    Varney Biles [id number]  ADMITTING    Cameron Sprang  REFERRING    Sid Falcon  PERFORMING   [facility], Inpatient  REFERRING    Molt, Bethany  SONOGRAPHER  Dance, Tiffany  cc:  ------------------------------------------------------------------- LV EF: 10% -   20%  ------------------------------------------------------------------- Indications:      CHF - 428.0.  -------------------------------------------------------------------  History:   PMH:  Enlarged Heart.  Cardiomyopathy.  ------------------------------------------------------------------- Study Conclusions  - Left ventricle: The cavity size was moderately to severely   dilated. Wall thickness was normal. Systolic function was   severely reduced. The estimated ejection fraction was in the   range of 10% to 20%. Severe diffuse hypokinesis. Although no   diagnostic regional wall motion abnormality was identified, this   possibility cannot be completely excluded on the basis of this   study. Doppler parameters are consistent with a reversible   restrictive pattern, indicative of decreased left ventricular   diastolic compliance and/or increased left atrial pressure (grade   3 diastolic dysfunction). - Ventricular septum: The contour showed diastolic flattening.   These changes are consistent with RV volume and pressure   overload. - Mitral valve: There was moderate to severe regurgitation. - Left atrium: The atrium was massively dilated. - Right ventricle: The cavity size was severely dilated. Wall   thickness was normal. - Right atrium: The atrium was massively dilated. - Tricuspid valve: There was malcoaptation of the valve   leaflets--they do not appear to coapt due to RV dilation. There   was severe regurgitation. - Pulmonic valve: There was moderate to severe regurgitation. - Pericardium, extracardiac: A trivial pericardial effusion was   identified. Features were not consistent with tamponade   physiology. Ascites was noted.  Impressions:  - Severe LV dysunction. All four chambers dilated. Severe TR due to   lack of coaptation of leaflets 2/2 RV dilation. Suspect that TR   jet underestimates filling pressures. Echo consistent with RV   pressure and volume overload. Moderate-severe MR and PR as well.   Trivial pericardial effusion without tamponade. Ascites present.  Recommendations:  Contacted primary team with results, recommend consulting advanced heart failure  service.  ------------------------------------------------------------------- Study data:  No prior study was available for comparison.  Study status:  Routine.  Procedure:  The patient reported no pain pre or post test. Transthoracic echocardiography. Image quality was adequate.  Study completion:  There were no complications. Transthoracic echocardiography.  M-mode, complete 2D, spectral Doppler, and color Doppler.  Birthdate:  Patient birthdate: 03/09/1961.  Age:  Patient is 57 yr old.  Sex:  Gender: male. BMI: 27.2 kg/m^2.  Blood pressure:     110/82  Patient status: Inpatient.  Study date:  Study date: 07/21/2018. Study time: 11:28 AM.  Location:  Bedside.  -------------------------------------------------------------------  ------------------------------------------------------------------- Left ventricle:  The cavity size was moderately to severely dilated. Wall thickness was normal. Systolic function was severely reduced. The estimated ejection fraction was in the range of 10% to 20%.  Severe diffuse hypokinesis. Although no diagnostic regional wall motion abnormality was identified, this possibility cannot be completely excluded on the basis of this study. Doppler parameters are consistent with a reversible restrictive pattern, indicative of decreased left ventricular diastolic compliance and/or increased left atrial pressure (grade 3 diastolic dysfunction).  ------------------------------------------------------------------- Aortic valve:   Trileaflet; normal thickness leaflets. Mobility was not restricted.  Doppler:  Transvalvular velocity was within the normal range. There was no stenosis. There was no regurgitation.   ------------------------------------------------------------------- Aorta:  Aortic root: The aortic root was normal in size.  ------------------------------------------------------------------- Mitral valve:   Structurally normal valve.   Mobility  was not restricted.  Doppler:  Transvalvular velocity was within the normal range. There was no evidence for stenosis. There was moderate to severe regurgitation.    Peak gradient (D): 4 mm Hg.  ------------------------------------------------------------------- Left atrium:  The atrium was massively dilated.  -------------------------------------------------------------------  Right ventricle:  The cavity size was severely dilated. Wall thickness was normal. Systolic function was low normal.  ------------------------------------------------------------------- Ventricular septum:   The contour showed diastolic flattening. These changes are consistent with RV volume and pressure overload.   ------------------------------------------------------------------- Pulmonic valve:    The valve appears to be grossly normal. Doppler:  Transvalvular velocity was within the normal range. There was no evidence for stenosis. There was moderate to severe regurgitation.  ------------------------------------------------------------------- Tricuspid valve:   Structurally normal valve.   There was malcoaptation of the valve leaflets--they do not appear to coapt due to RV dilation.  Doppler:  Transvalvular velocity was within the normal range. There was severe regurgitation.  ------------------------------------------------------------------- Pulmonary artery:   The main pulmonary artery was normal-sized. Systolic pressure was within the normal range.  ------------------------------------------------------------------- Right atrium:  The atrium was massively dilated.  ------------------------------------------------------------------- Pericardium:  A trivial pericardial effusion was identified. Doppler:  Features were not consistent with tamponade physiology.   ------------------------------------------------------------------- Systemic veins: Inferior vena cava: The vessel was dilated. The  respirophasic diameter changes were blunted (< 50%), consistent with elevated central venous pressure.  ------------------------------------------------------------------- Abdomen:  Ascites was noted.  ------------------------------------------------------------------- Measurements   Left ventricle                          Value        Reference  LV ID, ED, PLAX chordal         (H)     56    mm     43 - 52  LV ID, ES, PLAX chordal         (H)     51.2  mm     23 - 38  LV fx shortening, PLAX chordal  (L)     9     %      >=29  LV PW thickness, ED                     13.4  mm     ----------  IVS/LV PW ratio, ED                     0.61         <=1.3  LV e&', lateral                          6.18  cm/s   ----------  LV E/e&', lateral                        16.83        ----------  LV e&', medial                           2.81  cm/s   ----------  LV E/e&', medial                         37.01        ----------  LV e&', average                          4.5   cm/s   ----------  LV E/e&', average                        23.14        ----------  Ventricular septum                      Value        Reference  IVS thickness, ED                       8.11  mm     ----------    LVOT                                    Value        Reference  LVOT ID, S                              19    mm     ----------  LVOT area                               2.84  cm^2   ----------    Aorta                                   Value        Reference  Aortic root ID, ED                      34    mm     ----------  Ascending aorta ID, A-P, S              34    mm     ----------    Left atrium                             Value        Reference  LA ID, A-P, ES                          51    mm     ----------  LA ID/bsa, A-P                  (H)     2.93  cm/m^2 <=2.2  LA volume, S                            120   ml     ----------  LA volume/bsa, S                        69    ml/m^2 ----------  LA volume,  ES, 1-p A4C                  108   ml     ----------  LA volume/bsa, ES, 1-p A4C              62.1  ml/m^2 ----------  LA volume, ES, 1-p A2C                  132   ml     ----------  LA volume/bsa, ES, 1-p A2C  75.9  ml/m^2 ----------    Mitral valve                            Value        Reference  Mitral E-wave peak velocity             104   cm/s   ----------  Mitral A-wave peak velocity             40.3  cm/s   ----------  Mitral deceleration time                165   ms     150 - 230  Mitral peak gradient, D                 4     mm Hg  ----------  Mitral E/A ratio, peak                  2.6          ----------    Pulmonary arteries                      Value        Reference  PA pressure, S, DP              (H)     36    mm Hg  <=30    Tricuspid valve                         Value        Reference  Tricuspid regurg peak velocity          227   cm/s   ----------  Tricuspid peak RV-RA gradient           21    mm Hg  ----------    Right atrium                            Value        Reference  RA ID, S-I, ES, A4C             (H)     70.1  mm     34 - 49  RA area, ES, A4C                (H)     36.9  cm^2   8.3 - 19.5  RA volume, ES, A/L                      158   ml     ----------  RA volume/bsa, ES, A/L                  90.9  ml/m^2 ----------    Systemic veins                          Value        Reference  Estimated CVP                           15    mm Hg  ----------    Right ventricle  Value        Reference  RV ID, minor axis, ED, A4C base         46.8  mm     ----------  RV ID, minor axis, ED, A4C mid          28.9  mm     ----------  RV ID, major axis, ED, A4C      (H)     93.5  mm     55 - 91  TAPSE                                   18.3  mm     ----------  RV pressure, S, DP              (H)     36    mm Hg  <=30  RV s&', lateral, S                       8.76  cm/s   ----------    Pulmonic valve                          Value         Reference  Pulmonic regurg velocity, ED            93.7  cm/s   ----------  Legend: (L)  and  (H)  mark values outside specified reference range.  ------------------------------------------------------------------- Delford Field, Bridgette 2019-09-03T12:53:05    CLINICAL DATA:  VAD candidacy evaluation.  EXAM: CT CHEST, ABDOMEN, AND PELVIS WITH CONTRAST  TECHNIQUE: Multidetector CT imaging of the chest, abdomen and pelvis was performed following the standard protocol during bolus administration of intravenous contrast.  CONTRAST:  149m ISOVUE-300 IOPAMIDOL (ISOVUE-300) INJECTION 61%  COMPARISON:  Cardiac MRI dated 07/23/2018 and chest CTA dated 07/21/2018. Abdomen and pelvis CT dated 05/03/2008.  FINDINGS: CT CHEST FINDINGS  Cardiovascular: Diffusely enlarged heart and vomiting all 4 chambers. No pericardial effusion. Mild aortic and proximal coronary artery calcification. Mildly enlarged main pulmonary artery with a transverse diameter of 3.2 cm on image number 28 series 3. Normal sized aorta.  Mediastinum/Nodes: Mildly enlarged mediastinal nodes without significant change. The previously demonstrated 1.1 cm short axis right anterior paratracheal node has a short axis diameter of 1.0 cm today on image number 22 series 3. Normal appearing thyroid gland.  Lungs/Pleura: Interval minimal bilateral pleural fluid. Mild bibasilar atelectasis, increased.  Musculoskeletal: Unremarkable bones.  CT ABDOMEN PELVIS FINDINGS  Hepatobiliary: Heterogeneous liver with an enlarged lateral segment left lobe and caudate lobe and somewhat small right lobe.  Pancreas: Unremarkable. No pancreatic ductal dilatation or surrounding inflammatory changes.  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  The spleen is normal in size.  Adrenals/Urinary Tract: Normal appearing adrenal glands. Bilateral renal cysts. Unremarkable bladder and ureters.  Stomach/Bowel:  Stomach is within normal limits. Appendix appears normal. No evidence of bowel wall thickening, distention, or inflammatory changes.  Vascular/Lymphatic: Atheromatous aortic and iliac artery calcifications without aneurysm. No enlarged lymph nodes.  Reproductive: Mildly enlarged prostate gland.  Other: Small to moderate amount of free peritoneal fluid with mild improvement since 07/21/2018.  Musculoskeletal: Lumbar spine degenerative changes.  IMPRESSION: 1. Cardiomegaly involving all 4 chambers. 2. Mildly enlarged main pulmonary artery, possibly due to pulmonary arterial hypertension. 3. Interval minimal bilateral pleural fluid. 4. Mild bibasilar atelectasis, increased.  5. Stable mild mediastinal adenopathy. 6. Heterogeneous liver with findings compatible with cirrhosis. 7. Small to moderate amount of ascites, improved. 8. Mild prostatic hypertrophy.   Electronically Signed   By: Claudie Revering M.D.   On: 07/24/2018 22:43   CLINICAL DATA:  Preoperative evaluation prior to a VAD placement.  EXAM: ORTHOPANTOGRAM/PANORAMIC  COMPARISON:  None.  FINDINGS: Bilateral upper molar metallic fillings. The mid lower teeth are either missing and replaced with a prosthesis or obscured by artifact. No dental caries seen.  IMPRESSION: No visible dental cavities.   Electronically Signed   By: Claudie Revering M.D.   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  He was admitted with progressive fluid overload with worsening shortness of breath, orthopnea, and peripheral edema.  Echocardiogram on admission also showed a dilated and severely hypokinetic right ventricle with severe tricuspid regurgitation as well as moderate to severe mitral regurgitation.  He has improved since being started on  milrinone with good cardiac output and a drop in his filling pressures to normal.  His co-ox today was 76% with a CVP of 2-3.  His CVP/wedge ratio was 0.66 and his PAPi 2.65 on his right heart catheterization yesterday and his CVP is lower today.  This may suggest that he has better right ventricular function then his initial echocardiogram would suggest.  I agree that he will need advanced therapies and is not a transplant candidate at this time due to occasionally smoking marijuana and cigarettes prior to admission.  I think he is probably an acceptable candidate for LVAD therapy.  I would like to repeat his echocardiogram now that he has been diuresed to see if there has been improvement in his valvular function.  He may require tricuspid valve repair.  Moderate to severe pulmonary valve regurgitation is also concerning for longer-term right ventricular function.  He has had cirrhosis noted on right upper quadrant ultrasound and CT scan of the abdomen.  His INR is 1.2 and his transaminases are normal.  Total bilirubin is 3.5 and albumin is 3.1.  He tells me that he has never been a heavy ethanol user and only drinks an occasional beer.  There is some mention in his chart of having a previous heavy alcohol use.  It is certainly possible that his liver dysfunction now may be due to congestive hepatopathy due to right ventricular failure.  His hepatitis screen is negative.  He was a previous smoker but said that he has not smoked over the previous few months since he began having more shortness of breath.  I discussed advanced heart failure therapies with him including heart transplantation, LVAD therapy, and home inotropic therapy.  He is not currently a heart transplant candidate so I think LVAD therapy is the best option for him.  I discussed the surgical procedure, benefits and risks, as well as the postoperative recovery with him and his wife.  He is interested in proceeding with evaluation.  We will  discuss him tomorrow at [facility].  I spent 60 minutes performing this consultation and > 50% of this time was spent face to face counseling and coordinating the care of this patient's severe biventricular systolic congestive heart failure.  Theodore Welch 07/26/2018, 2:06 PM

## 2018-07-26 NOTE — Progress Notes (Signed)
Patient ID: Theodore Welch, male   DOB: Jul 26, 1961, 57 y.o.   MRN: [id number]     Advanced Heart Failure Rounding Note  PCP-Cardiologist: No primary care provider on file.   Subjective:    PICC line placed 07/21/18. Initial Co-ox 43%. Started on milrinone 0.25 mcg/kg/min, increased to 0.375 with persistently low co-ox.   Coox 76% today on milrinone 0.375 mcg/kg/min. Eating breakfast, feels good.  CVP down to 2-3.   Feels good on milrinone. No complaints today.   Venous US 07/22/18 showed right peroneal vein DVT, now on heparin gtt.   cMRI 07/23/18 1.  Moderate dilated LV with EF 14%, severe diffuse hypokinesis. 2. Severely dilated RV with moderate to severe systolic dysfunction, EF 25%. 3. There mid-wall LGE at the inferior RV insertion site. This is a nonspecific LGE pattern and can be seen with pressure/volume Overload.  RHC Procedural Findings (9/7): Hemodynamics (on milrinone 0.375 mcg/kg/min) RA mean 8 RV 40/12 PA 40/19, mean 27 PCWP mean 12 CVP/PCWP 0.66 PAPi 2.65 Oxygen saturations: PA 70% AO 95% Cardiac Output (Fick) 5.84  Cardiac Index (Fick) 3.18 PVR 2.6 WU (Fick) CardiacOutput (Thermo) 4.97 Cardiac Index (Themo) 2.71  PVR 3 WU (Thermo)   Objective:    Weight Range: 63.2 kg Body mass index is 19.99 kg/m.   Vital Signs:   Temp:  [97.5 F (36.4 C)-98.5 F (36.9 C)] 97.9 F (36.6 C) (09/08 0752) Pulse Rate:  [56-96] 56 (09/08 0319) Resp:  [16-25] 19 (09/08 0319) BP: (86-99)/(64-86) 86/72 (09/08 0752) SpO2:  [89 %-100 %] 89 % (09/08 0319) Weight:  [63.2 kg] 63.2 kg (09/08 0319) Last BM Date: 07/25/18  Weight change: Filed Weights   07/24/18 0700 07/25/18 0353 07/26/18 0319  Weight: 65.8 kg 67.3 kg 63.2 kg   Intake/Output:   Intake/Output Summary (Last 24 hours) at 07/26/2018 0844 Last data filed at 07/26/2018 0510 Gross per 24 hour  Intake 1236.48 ml  Output 5001 ml  Net -3764.52 ml    Physical Exam    General: NAD Neck: No JVD, no thyromegaly  or thyroid nodule.  Lungs: Clear to auscultation bilaterally with normal respiratory effort. CV: Nondisplaced PMI.  Heart regular S1/S2, no S3/S4, 1/6 HSM apex.  No peripheral edema.   Abdomen: Soft, nontender, no hepatosplenomegaly, no distention.  Skin: Intact without lesions or rashes.  Neurologic: Alert and oriented x 3.  Psych: Normal affect. Extremities: No clubbing or cyanosis.  HEENT: Normal.    Telemetry   NSR 90s, personally reviewed.   EKG    No new tracings.    Labs    CBC Recent Labs    07/25/18 0227 07/26/18 0308  WBC 6.7 6.0  HGB 12.6* 13.4  HCT 38.9* 41.1  MCV 87.2 86.5  PLT 165 169   Basic Metabolic Panel Recent Labs    78/29/56 0227 07/26/18 0308  NA 138 140  K 4.6 3.9  CL 105 100  CO2 25 30  GLUCOSE 98 106*  BUN 15 16  CREATININE 0.94 1.00  CALCIUM 8.8* 9.3  MG 2.0  --    Liver Function Tests No results for input(s): AST, ALT, ALKPHOS, BILITOT, PROT, ALBUMIN in the last 72 hours. No results for input(s): LIPASE, AMYLASE in the last 72 hours. Cardiac Enzymes No results for input(s): CKTOTAL, CKMB, CKMBINDEX, TROPONINI in the last 72 hours.  BNP: BNP (last 3 results) Recent Labs    07/21/18 0540  BNP 1,343.9*    ProBNP (last 3 results) No results for input(s): PROBNP  in the last 8760 hours.   D-Dimer No results for input(s): DDIMER in the last 72 hours. Hemoglobin A1C No results for input(s): HGBA1C in the last 72 hours. Fasting Lipid Panel No results for input(s): CHOL, HDL, LDLCALC, TRIG, CHOLHDL, LDLDIRECT in the last 72 hours. Thyroid Function Tests Recent Labs    07/24/18 1623  TSH 0.880    Other results:   Imaging    No results found.   Medications:     Scheduled Medications: . aspirin EC  81 mg Oral Daily  . atorvastatin  10 mg Oral Daily  . digoxin  0.125 mg Oral Daily  . DULoxetine  60 mg Oral Daily  . feeding supplement (ENSURE ENLIVE)  237 mL Oral BID BM  . gabapentin  600 mg Oral TID  .  insulin aspart  0-9 Units Subcutaneous TID WC  . losartan  12.5 mg Oral BID  . pantoprazole  40 mg Oral Daily  . sodium chloride flush  10-40 mL Intracatheter Q12H  . sodium chloride flush  3 mL Intravenous Q12H  . sodium chloride flush  3 mL Intravenous Q12H  . spironolactone  25 mg Oral Daily  . thiamine  100 mg Oral Daily  . [START ON 07/27/2018] torsemide  40 mg Oral Daily    Infusions: . sodium chloride Stopped (07/25/18 2100)  . heparin 1,400 Units/hr (07/26/18 0510)  . milrinone 0.375 mcg/kg/min (07/26/18 0509)    PRN Medications: sodium chloride, acetaminophen **OR** acetaminophen, hydrOXYzine, ipratropium-albuterol, methocarbamol, nicotine, ondansetron (ZOFRAN) IV, senna-docusate, sodium chloride flush, sodium chloride flush    Patient Profile   Theodore Welch is a 57 y.o. 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  Admitted with worsening SOB and orthopnea in setting of severe biventricular CHF.   Assessment/Plan   1. Acute on chronic systolic CHF:  Nonischemic cardiomyopathy based on cath 7/19 in [place]. Remote heavy ETOH, says he has quit drinking completely for about 2 months now. HIV negative.  No cocaine/amphetamines.  Echo was done this admission with biventricular failure, LV EF 10-20% with dilated RV. There is moderate to severe MR and severe TR with poor coaptation of TV leaflets.  On exam, he was markedly volume overloaded initially.  Low output HF indicated by low Co-ox of 43% initially.  cMRI 07/23/18 LVEF EF 14%, Severe RF dysfunction (EF 25%) with nonspecific RV insertion site LGE pattern likely from pressure/volume overload. RHC showed good cardiac output on milrinone with filling pressures nearing normal.  PAPi 2.65 suggests that RV function is better than expected from imaging.  Co-ox 76% today, CVP 2-3. Vigorous diuresis yesterday, weight down.  - Hold torsemide today, restart 40 mg daily  tomorrow.  - Continue milrinone 0.375.  - No beta blocker with low output.  - Continue spironolactone 25 mg daily.  - Continue losartan 12.5 mg BID, no BP room to titrate.  - Continue digoxin 0.125 daily, level ok today.  - Suspect that he will need for advanced therapies.  I think that he is going to be milrinone-dependent.  He has been noted to have cirrhosis on RUQ [name] and CT and has significant RV dysfunction.  INR 1.2, so liver synthetic function is not markedly abnormal.  He was smoking marijuana and occasional cigarettes prior to admission, so will not be a transplant candidate until abstinent 6 months.  I do not think that he has that long to wait.  RHC on milrinone and with  diuresis on 9/6 looked good, PAPi was 2.65 which seems to indicate adequate RV function. We have initiated LVAD workup, will need evaluation by LVAD surgeon.  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   2. OSA: CPAP qhs.   3. COPD:  He had mostly quit smoking before admission but still smoked a few cigarettes/week.  - Check PFTs => hopefully can get today.   4. 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 ?component of ETOH cirrhosis given prior heavy ETOH (has now quit). RUQ US showed ascites with evidence for cirrhosis. NH3 with initial elevation at 64 on 9/3. CT abdomen showed resolution of ascites but there was evidence for cirrhosis.  INR 1.2, adequate liver synthetic function.  - Send viral hepatitis labs => Negative.  5. RLE DVT:  He has a right peroneal vein (below knee) DVT.  Even though below knee, think he is a relatively high risk patient with severe cardiomyopathy.  Would favor 6 months anticoagulation.  -  He is on heparin gtt for now.  Will leave on heparin gtt due to possibility of LVAD this admission.  6. Valvular heart disease: Moderate to severe MR and severe TR on echo.  Suspect this is functional due to annular dilation. Hopefully TR has improved with diuresis.   Marca Ancona, MD  07/26/2018 8:44 AM  Advanced Heart Failure Team Pager [phone] (M-F; 7a - 4p)  Please contact [facility] for night-coverage after hours (4p -7a ) and weekends on [email or web address]

## 2018-07-26 NOTE — Progress Notes (Signed)
Patient declined CPAP at this time, no distress noted RCP will continue to follow.  

## 2018-07-27 ENCOUNTER — Encounter (HOSPITAL_COMMUNITY): Payer: Self-pay | Admitting: Cardiology

## 2018-07-27 ENCOUNTER — Inpatient Hospital Stay (HOSPITAL_COMMUNITY): Payer: Medicare Other

## 2018-07-27 DIAGNOSIS — Z515 Encounter for palliative care: Secondary | ICD-10-CM

## 2018-07-27 DIAGNOSIS — Z7189 Other specified counseling: Secondary | ICD-10-CM

## 2018-07-27 LAB — CBC
HCT: 39.7 % (ref 39.0–52.0)
Hemoglobin: 12.7 g/dL — ABNORMAL LOW (ref 13.0–17.0)
MCH: 27.7 pg (ref 26.0–34.0)
MCHC: 32 g/dL (ref 30.0–36.0)
MCV: 86.5 fL (ref 78.0–100.0)
Platelets: 166 10*3/uL (ref 150–400)
RBC: 4.59 MIL/uL (ref 4.22–5.81)
RDW: 15.1 % (ref 11.5–15.5)
WBC: 6.4 10*3/uL (ref 4.0–10.5)

## 2018-07-27 LAB — PULMONARY FUNCTION TEST
DL/VA % pred: 92 %
DL/VA: 4.26 ml/min/mmHg/L
DLCO COR % PRED: 56 %
DLCO UNC % PRED: 53 %
DLCO UNC: 17.14 ml/min/mmHg
DLCO cor: 18.2 ml/min/mmHg
FEF 25-75 Pre: 2.34 L/sec
FEF2575-%Pred-Pre: 79 %
FEV1-%PRED-PRE: 78 %
FEV1-PRE: 2.47 L
FEV1FVC-%PRED-PRE: 99 %
FEV6-%Pred-Pre: 80 %
FEV6-Pre: 3.14 L
FEV6FVC-%PRED-PRE: 104 %
FVC-%Pred-Pre: 79 %
FVC-Pre: 3.19 L
PRE FEV1/FVC RATIO: 78 %
Pre FEV6/FVC Ratio: 100 %
RV % PRED: 118 %
RV: 2.65 L
TLC % PRED: 82 %
TLC: 5.75 L

## 2018-07-27 LAB — COOXEMETRY PANEL
CARBOXYHEMOGLOBIN: 1.8 % — AB (ref 0.5–1.5)
METHEMOGLOBIN: 0.7 % (ref 0.0–1.5)
O2 Saturation: 72.2 %
TOTAL HEMOGLOBIN: 13.1 g/dL (ref 12.0–16.0)

## 2018-07-27 LAB — BASIC METABOLIC PANEL
Anion gap: 8 (ref 5–15)
BUN: 14 mg/dL (ref 6–20)
CALCIUM: 9.2 mg/dL (ref 8.9–10.3)
CO2: 30 mmol/L (ref 22–32)
Chloride: 101 mmol/L (ref 98–111)
Creatinine, Ser: 0.94 mg/dL (ref 0.61–1.24)
GFR calc Af Amer: >60 mL/min (ref >60)
GLUCOSE: 96 mg/dL (ref 70–99)
Potassium: 4.1 mmol/L (ref 3.5–5.1)
Sodium: 139 mmol/L (ref 135–145)

## 2018-07-27 LAB — HEPARIN LEVEL (UNFRACTIONATED): HEPARIN UNFRACTIONATED: 0.43 [IU]/mL (ref 0.30–0.70)

## 2018-07-27 LAB — GLUCOSE, CAPILLARY
GLUCOSE-CAPILLARY: 93 mg/dL (ref 70–99)
Glucose-Capillary: 98 mg/dL (ref 70–99)
Glucose-Capillary: 114 mg/dL — ABNORMAL HIGH (ref 70–99)
Glucose-Capillary: 138 mg/dL — ABNORMAL HIGH (ref 70–99)

## 2018-07-27 NOTE — Progress Notes (Addendum)
Subjective: Theodore Welch reports feeling well this morning without complaint. Coox 72% today. Notes ABIs and TBIs normal yesterday. No questions.  Objective:  Vital signs in last 24 hours: Vitals:   07/26/18 2300 07/27/18 0357 07/27/18 0735 07/27/18 1213  BP: 99/74 95/73 98/63  99/82  Pulse:   97 94  Resp: (!) 26 20 18  (!) 24  Temp: 98.2 F (36.8 C) 98.2 F (36.8 C) 98.5 F (36.9 C) 97.7 F (36.5 C)  TempSrc: Oral Oral Oral Oral  SpO2:   98% 97%  Weight:  65 kg    Height:       Weight change: 1.8 kg  Intake/Output Summary (Last 24 hours) at 07/27/2018 1219 Last data filed at 07/27/2018 0800 Gross per 24 hour  Intake 983.45 ml  Output 2975 ml  Net -1991.55 ml   Physical Exam General: Pleasant mood, eating breakfast Neck: No JVD, trachea midline Heart: RRR, no m/r/g Lungs: normal effort. CTAB Ext: No edema. 2+ dorsalis pedis and posterior tibial   Assessment/Plan:  Principal Problem:   CHF (congestive heart failure) (HCC) Active Problems:   Tobacco use   Congestive heart failure (HCC)   Type 2 diabetes mellitus (HCC)   Hyperlipidemia   Ascites   Malnutrition of moderate degree   Patient Profile: Theodore Welch is a 57yo M with DM2,HTN, HLD, COPD, who presents with worsening of severe biventricular heart failure. Patient presented with PND,orthopnea, and LE edema that has worsened over the past 2-3 weeks. He is feeling better with diuresisand new milrinone. Netnegative12Lsinceadmission.Co-ox improved on milrinone.C-MRI on 9/5 without signs of infiltrative disease. RHC on 9/6 showed good cardiac output and cardiology discussing possible LVAD placement.  Assessment and Plan: Severe biventricular CHF 2/2 NICM C-MRI on 09/05 without signs of infiltrative disease. Seen by CT Surgery yesterday and evaluating to schedule LVAD surgery. Will still need to considerevaluationfortransplant. ABIs and TBIs nl yesterday. - Following cardiology-heart failure  recommendations - Patient wearing Lifevest at home - Continue digoxin0.125. Milrinone.375, ASA81mg ,losartan12.5mg  BID, spirolactone 25mg  - Per CT surgery, TTE today to evaluate improvement of TV and MV after diuresis.  LVAD maybe this week  -1.7L out yesterday. Restart torsemide 40 mg today as per Heart Failure.   RLE DVT Right peroneal DVT. Plan for 6 months anticoagulation. -Continue heparin gtt for now with possible LVAD placementthis week  Cirrhosis  RUQ [name] 9/3 shows cirrhotic changes.Heavy alcohol use off and on for many years. He has quit drinking for 4 months now. Hepatitis panel neg.Likely worsened 2/2 congestive heart failure.Neg hep B antibody. INR 1.2. - will need immunization of hep B in future  DM2 CBGs controlled.Hgb A1c 6.5%. Holding home Metformin 500 mg BID.  - HH/Carb mod diet - Sensitive sling-scale insulinbut CBG's ~90's  Anxiety - Continue home Cymbalta (also for chronic low back pain) - Continue home PRN Hydroxyzine 25mg  TID   COPD : - PFT's today  Dispo: Pending LVAD workup . Patientlikely dependent onmilrinone and will need discharge to transplant center.  Diet: HH/Carb mod DVT HAL:[id number] gtt IVF: None Code: Full   LOS: 6 days   Gardenia Phlegm, Medical Student 07/27/2018, 12:19 PM   Attestation for Student Documentation:  I personally was present and performed or re-performed the history, physical exam and medical decision-making activities of this service and have verified that the service and findings are accurately documented in the student's note. Currently undergoing work-up for LVAD placement. Appreciate coordination of care by HF and CVTS.   Katriana Dortch, DO 07/27/2018, 1:09 PM

## 2018-07-27 NOTE — Progress Notes (Signed)
Patient ID: Theodore Welch, male   DOB: 1961/01/05, 57 y.o.   MRN: [id number]     Advanced Heart Failure Rounding Note  PCP-Cardiologist: No primary care provider on file.   Subjective:    PICC line placed 07/21/18. Initial Co-ox 43%. Started on milrinone 0.25 mcg/kg/min, increased to 0.375 with persistently low co-ox.   Coox 72% today on milrinone 0.375 mcg/kg/min. No complaints.   Feels good on milrinone.  CVP 5, no diuretic yesterday.    Venous US 07/22/18 showed right peroneal vein DVT, now on heparin gtt.   cMRI 07/23/18 1.  Moderate dilated LV with EF 14%, severe diffuse hypokinesis. 2. Severely dilated RV with moderate to severe systolic dysfunction, EF 25%. 3. There mid-wall LGE at the inferior RV insertion site. This is a nonspecific LGE pattern and can be seen with pressure/volume Overload.  RHC Procedural Findings (9/7): Hemodynamics (on milrinone 0.375 mcg/kg/min) RA mean 8 RV 40/12 PA 40/19, mean 27 PCWP mean 12 CVP/PCWP 0.66 PAPi 2.65 Oxygen saturations: PA 70% AO 95% Cardiac Output (Fick) 5.84  Cardiac Index (Fick) 3.18 PVR 2.6 WU (Fick) CardiacOutput (Thermo) 4.97 Cardiac Index (Themo) 2.71  PVR 3 WU (Thermo)   Objective:    Weight Range: 65 kg Body mass index is 20.56 kg/m.   Vital Signs:   Temp:  [97.4 F (36.3 C)-98.5 F (36.9 C)] 98.5 F (36.9 C) (09/09 0735) Pulse Rate:  [97] 97 (09/09 0735) Resp:  [18-26] 18 (09/09 0735) BP: (86-101)/(61-79) 98/63 (09/09 0735) SpO2:  [97 %-98 %] 98 % (09/09 0735) Weight:  [65 kg] 65 kg (09/09 0357) Last BM Date: 07/25/18  Weight change: Filed Weights   07/25/18 0353 07/26/18 0319 07/27/18 0357  Weight: 67.3 kg 63.2 kg 65 kg   Intake/Output:   Intake/Output Summary (Last 24 hours) at 07/27/2018 0806 Last data filed at 07/27/2018 0756 Gross per 24 hour  Intake 983.45 ml  Output 3050 ml  Net -2066.55 ml    Physical Exam    General: NAD Neck: No JVD, no thyromegaly or thyroid nodule.  Lungs: Clear  to auscultation bilaterally with normal respiratory effort. CV: Nondisplaced PMI.  Heart regular S1/S2, no S3/S4, 1/6 HSM apex.  No peripheral edema.   Abdomen: Soft, nontender, no hepatosplenomegaly, no distention.  Skin: Intact without lesions or rashes.  Neurologic: Alert and oriented x 3.  Psych: Normal affect. Extremities: No clubbing or cyanosis.  HEENT: Normal.    Telemetry   NSR 90s, personally reviewed.   EKG    No new tracings.    Labs    CBC Recent Labs    07/26/18 0308 07/27/18 0211  WBC 6.0 6.4  HGB 13.4 12.7*  HCT 41.1 39.7  MCV 86.5 86.5  PLT 169 166   Basic Metabolic Panel Recent Labs    40/98/11 0227 07/26/18 0308 07/27/18 0211  NA 138 140 139  K 4.6 3.9 4.1  CL 105 100 101  CO2 25 30 30   GLUCOSE 98 106* 96  BUN 15 16 14   CREATININE 0.94 1.00 0.94  CALCIUM 8.8* 9.3 9.2  MG 2.0  --   --    Liver Function Tests No results for input(s): AST, ALT, ALKPHOS, BILITOT, PROT, ALBUMIN in the last 72 hours. No results for input(s): LIPASE, AMYLASE in the last 72 hours. Cardiac Enzymes No results for input(s): CKTOTAL, CKMB, CKMBINDEX, TROPONINI in the last 72 hours.  BNP: BNP (last 3 results) Recent Labs    07/21/18 0540  BNP 1,343.9*  ProBNP (last 3 results) No results for input(s): PROBNP in the last 8760 hours.   D-Dimer No results for input(s): DDIMER in the last 72 hours. Hemoglobin A1C No results for input(s): HGBA1C in the last 72 hours. Fasting Lipid Panel No results for input(s): CHOL, HDL, LDLCALC, TRIG, CHOLHDL, LDLDIRECT in the last 72 hours. Thyroid Function Tests Recent Labs    07/24/18 1623  TSH 0.880    Other results:   Imaging    No results found.   Medications:     Scheduled Medications: . aspirin EC  81 mg Oral Daily  . atorvastatin  10 mg Oral Daily  . digoxin  0.125 mg Oral Daily  . DULoxetine  60 mg Oral Daily  . feeding supplement (ENSURE ENLIVE)  237 mL Oral BID BM  . gabapentin  600 mg  Oral TID  . insulin aspart  0-9 Units Subcutaneous TID WC  . losartan  12.5 mg Oral BID  . pantoprazole  40 mg Oral Daily  . sodium chloride flush  10-40 mL Intracatheter Q12H  . sodium chloride flush  3 mL Intravenous Q12H  . sodium chloride flush  3 mL Intravenous Q12H  . spironolactone  25 mg Oral Daily  . thiamine  100 mg Oral Daily  . torsemide  40 mg Oral Daily    Infusions: . sodium chloride Stopped (07/25/18 2100)  . heparin 1,400 Units/hr (07/26/18 2151)  . milrinone 0.375 mcg/kg/min (07/26/18 2307)    PRN Medications: sodium chloride, acetaminophen **OR** acetaminophen, hydrOXYzine, ipratropium-albuterol, methocarbamol, nicotine, ondansetron (ZOFRAN) IV, senna-docusate, sodium chloride flush, sodium chloride flush    Patient Profile   Theodore Welch is a 57 y.o. 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  Admitted with worsening SOB and orthopnea in setting of severe biventricular CHF.   Assessment/Plan   1. Acute on chronic systolic CHF:  Nonischemic cardiomyopathy based on cath 7/19 in [place]. Remote heavy ETOH, says he has quit drinking completely for about 2 months now. HIV negative.  No cocaine/amphetamines.  Echo was done this admission with biventricular failure, LV EF 10-20% with dilated RV. There is moderate to severe MR and severe TR with poor coaptation of TV leaflets.  On exam, he was markedly volume overloaded initially.  Low output HF indicated by low Co-ox of 43% initially.  cMRI 07/23/18 LVEF EF 14%, Severe RF dysfunction (EF 25%) with nonspecific RV insertion site LGE pattern likely from pressure/volume overload. RHC showed good cardiac output on milrinone with filling pressures nearing normal.  PAPi 2.65 suggests that RV function is better than expected from imaging.  Co-ox 72% today, CVP 5.  - Can restart torsemide 40 mg daily.   - Continue milrinone 0.375.  - No beta blocker with low  output.  - Continue spironolactone 25 mg daily.  - Continue losartan 12.5 mg BID, no BP room to titrate.  - Continue digoxin 0.125 daily.  - Suspect that he will need for advanced therapies.  I think that he is going to be milrinone-dependent.  He has been noted to have cirrhosis on RUQ [name] and CT and has significant RV dysfunction.  INR 1.2, so liver synthetic function is not markedly abnormal.  He was smoking marijuana and occasional cigarettes prior to admission, so will not be a transplant candidate until abstinent 6 months.  I do not think that he has that long to wait.  RHC on milrinone and with diuresis on 9/6  looked good, PAPi was 2.65 which seems to indicate adequate RV function. We have initiated LVAD workup, he has been seen by Dr. Laneta Simmers.  Will discuss in MRB, hopefully LVAD this week. 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   2. OSA: CPAP qhs.   3. COPD:  He had mostly quit smoking before admission but still smoked a few cigarettes/week.  - Check PFTs => needs today.   4. 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 ?component of ETOH cirrhosis given prior heavy ETOH (has now quit). RUQ US showed ascites with evidence for cirrhosis. NH3 with initial elevation at 64 on 9/3. CT abdomen showed resolution of ascites but there was evidence for cirrhosis.  INR 1.2, adequate liver synthetic function.  - Send viral hepatitis labs => Negative.  5. RLE DVT:  He has a right peroneal vein (below knee) DVT.  Even though below knee, think he is a relatively high risk patient with severe cardiomyopathy.  Would favor 6 months anticoagulation.  -  He is on heparin gtt for now.  Will leave on heparin gtt due to possibility of LVAD this admission.  6. Valvular heart disease: Moderate to severe MR and severe TR on echo.  Suspect this is functional due to annular dilation. Hopefully TR has improved with diuresis.  - Repeat echo now that he has been diuresed to reassess TR.    Marca Ancona, MD  07/27/2018 8:06 AM  Advanced Heart Failure Team Pager [phone] (M-F; 7a - 4p)  Please contact [facility] for night-coverage after hours (4p -7a ) and weekends on [email or web address]

## 2018-07-27 NOTE — Progress Notes (Signed)
  Date: 07/27/2018  Patient name: [facility]  Medical record number: [id number]  Date of birth: 1961-06-04   I have seen and evaluated this patient and I have discussed the plan of care with the house staff. Please see Dr. Lana Fish note for complete details. I concur with her findings.    Inez Catalina, MD 07/27/2018, 2:16 PM

## 2018-07-27 NOTE — Progress Notes (Signed)
RT placed pt on CPAP for the night on home setting of 4 as stated by pt. Pt comfortable and will call if he needs assistance. Rt will continue to monitor.

## 2018-07-27 NOTE — Progress Notes (Signed)
CARDIAC REHAB PHASE I   Attempted to see pt x2. First try, pt off the floor for testing.  Second attempt pt was in the middle of his VAD interview. Will try to catch pt for 6 minute walk test tomorrow.   Reynold Bowen, RN BSN 07/27/2018 2:14 PM

## 2018-07-27 NOTE — Progress Notes (Signed)
ANTICOAGULATION CONSULT NOTE  Pharmacy Consult for heparin Indication: DVT  No Known Allergies  Patient Measurements: Height: 5\' 10"  (177.8 cm) Weight: 143 lb 4.8 oz (65 kg) IBW/kg (Calculated) : 73 Heparin Dosing Weight: 67.7 kg  Vital Signs: Temp: 98.5 F (36.9 C) (09/09 0735) Temp Source: Oral (09/09 0735) BP: 98/63 (09/09 0735) Pulse Rate: 97 (09/09 0735)  Labs: Recent Labs    07/24/18 1623  07/25/18 0227 07/25/18 1631 07/26/18 0308 07/26/18 0856 07/27/18 0211  HGB  --   --  12.6*  --  13.4  --  12.7*  HCT  --   --  38.9*  --  41.1  --  39.7  PLT  --   --  165  --  169  --  166  APTT 37*  --   --   --   --   --   --   LABPROT 15.5*  --  16.1*  --   --   --   --   INR 1.24  --  1.31  --   --   --   --   HEPARINUNFRC  --    < > 0.29* 0.49  --  0.45 0.43  CREATININE  --   --  0.94  --  1.00  --  0.94   < > = values in this interval not displayed.    Estimated Creatinine Clearance: 77.8 mL/min (by C-G formula based on SCr of 0.94 mg/dL).   Assessment: 56 y.o. male with RLE DVT for heparin. No anticoagulation PTA.   Patient s/p RHC 9/6, original discussion was to restart apixaban, but plans have changed. Restart heparin in anticipation of possible LVAD.  Heparin level continues to be therapeutic this morning at 0.43, on 1400 units/hr. Hgb stable. No s/sx of bleeding or issues with infusion reported by nursing.  Goal of Therapy:  Heparin level 0.3-0.7 units/ml Monitor platelets by anticoagulation protocol: Yes   Plan:  Continue heparin infusion to 1400 units/hr  Check anti-Xa level daily while on heparin Continue to monitor H&H and platelets  Sheppard Coil PharmD., BCPS Clinical Pharmacist 07/27/2018 10:40 AM

## 2018-07-27 NOTE — Progress Notes (Signed)
VAD evaluation consent reviewed and signed by pt and caregiver. Initial VAD teaching completed with pt and caregiver(s). VAD teaching packet left at bedside for their review. Patient and caregiver(s) asked questions and had good interaction with VAD coordinator; all questions answered regarding VAD implant,  hospital stay, and what to expect when discharged home. Pt identified [name] as primary caregiver(s). Explained need for 24/7 care when pt discharged home;  both pt and caregiver(s) verbalized understanding of above.   Explained that LVAD can be implanted for two indications: 1. Bridge to transplant - used for patients who cannot safely wait for heart transplant.  2. Destination therapy - used for patients until end of life or recovery of heart function.  Patient and caregiver(s) acknowledge understanding that pt will be getting LVAD as Destination therapy because of current smoking.  [name] RN, BSN VAD Coordinator 24/7 Pager [phone]

## 2018-07-27 NOTE — Consult Note (Signed)
Consultation Note Date: 07/27/2018   Patient Name: Theodore Welch  DOB: [birth year]  MRN: [id number]  Age / Sex: 57 y.o., male  PCP: Reubin Milan, MD Referring Physician: Sid Falcon, MD  Reason for Consultation: LVAD eval  HPI/Patient Profile: 57 y.o. 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 Undergoing evaluation for LVAD currently.   Clinical Assessment and Goals of Care: I met today with Theodore Welch and we were joined mid-conversation by his significant other. We discussed LVAD procedure and care, recovery period, and expectations that this can be a rocky road. Theodore Welch is optimistic for LVAD and we shared in his goals for improved day-to-day QOL and to spend more time with his family and great grandchildren. He has 7 children and multiple grands and great grands. He has a large family and a sister that is a retired Marine scientist and nieces who are nurses that he knows plan to help him after surgery along with his significant other. Caregiver support is not an issue.   We discussed further about Living Will/HCPOA that he has not previously discussed. I encouraged Theodore Welch about the importance of this issue for all of [name] and that having this could help alleviate stress from his loved ones if they are ever faced with these decisions for him. He seemed somewhat uncomfortable discussing this but does express desire to review document so I did provide this for him. His significant other was more comfortable discussing and shares that her mother's wishes were clear and this enabled them to make sure that she had a peaceful death. I provided some examples of  discussion points and encouraged Theodore Welch to talk with his loved ones about his QOL and what he considers to be living vs existing and are there any scenarios he would NOT want his life prolonged. He has a difficult time thinking about this under the light of hope given him from current LVAD discussion. He does express that he has arrangements already in place for his funeral and that he is thankful that he has had a good life but is hopeful for more time.   Ultimately they asked good questions regarding LVAD and accepted Living Will packets. I left them with my contact information as well. Plan to follow up tomorrow.   Primary Decision Maker PATIENT    SUMMARY OF RECOMMENDATIONS   - Good candidate for LVAD from palliative viewpoint  - Somewhat uncomfortable discussing EOL issues/wishes  Code Status/Advance Care Planning:  Full code   Symptom Management:   Per primary and heart failure team.   Palliative Prophylaxis:   Bowel Regimen and Frequent Pain Assessment  Additional Recommendations (Limitations, Scope, Preferences):  Full Scope Treatment  Psycho-social/Spiritual:   Desire for further Chaplaincy support: yes he expresses  that he relies on his faith and trust in God  Additional Recommendations: Caregiving  Support/Resources  Prognosis:   Unable to determine  Discharge Planning: To Be Determined      Primary Diagnoses: Present on Admission: **None**   I have reviewed the medical record, interviewed the patient and family, and examined the patient. The following aspects are pertinent.  Past Medical History:  Diagnosis Date  . Cardiomyopathy, unspecified ([place])   . CHF (congestive heart failure) ([place])   . Chronic back pain   . Enlarged heart   . Gastric ulcer   . Gastroenteritis   . H/O degenerative disc disease    Social History   Socioeconomic History  . Marital status: Single    Spouse name: Not on file  . Number of children: Not on file  .  Years of education: Not on file  . Highest education level: Not on file  Occupational History  . Not on file  Social Needs  . Financial resource strain: Not on file  . Food insecurity:    Worry: Not on file    Inability: Not on file  . Transportation needs:    Medical: Not on file    Non-medical: Not on file  Tobacco Use  . Smoking status: Current Every Day Smoker    Types: Cigarettes    Start date: 2003  . Smokeless tobacco: Never Used  Substance and Sexual Activity  . Alcohol use: Not Currently  . Drug use: Not Currently  . Sexual activity: Not on file  Lifestyle  . Physical activity:    Days per week: Not on file    Minutes per session: Not on file  . Stress: Not on file  Relationships  . Social connections:    Talks on phone: Not on file    Gets together: Not on file    Attends religious service: Not on file    Active member of club or organization: Not on file    Attends meetings of clubs or organizations: Not on file    Relationship status: Not on file  Other Topics Concern  . Not on file  Social History Narrative  . Not on file   History reviewed. No pertinent family history. Scheduled Meds: . aspirin EC  81 mg Oral Daily  . atorvastatin  10 mg Oral Daily  . digoxin  0.125 mg Oral Daily  . DULoxetine  60 mg Oral Daily  . feeding supplement (ENSURE ENLIVE)  237 mL Oral BID BM  . gabapentin  600 mg Oral TID  . insulin aspart  0-9 Units Subcutaneous TID WC  . losartan  12.5 mg Oral BID  . pantoprazole  40 mg Oral Daily  . sodium chloride flush  10-40 mL Intracatheter Q12H  . sodium chloride flush  3 mL Intravenous Q12H  . sodium chloride flush  3 mL Intravenous Q12H  . spironolactone  25 mg Oral Daily  . thiamine  100 mg Oral Daily  . torsemide  40 mg Oral Daily   Continuous Infusions: . sodium chloride Stopped (07/25/18 2100)  . heparin 1,400 Units/hr (07/26/18 2151)  . milrinone 0.375 mcg/kg/min (07/27/18 1258)   PRN Meds:.sodium chloride,  acetaminophen **OR** acetaminophen, hydrOXYzine, ipratropium-albuterol, methocarbamol, nicotine, ondansetron (ZOFRAN) IV, senna-docusate, sodium chloride flush, sodium chloride flush No Known Allergies Review of Systems  Constitutional: Positive for fatigue.  Respiratory: Positive for shortness of breath.     Physical Exam  Constitutional: He is oriented to person, place, and time. He appears  well-developed.  HENT:  Head: Normocephalic and atraumatic.  Cardiovascular: Normal rate.  Pulmonary/Chest: Effort normal. No accessory muscle usage. No tachypnea. No respiratory distress.  Abdominal: Normal appearance.  Neurological: He is alert and oriented to person, place, and time.  Nursing note and vitals reviewed.   Vital Signs: BP 99/82 (BP Location: Left Arm)   Pulse 94   Temp 97.7 F (36.5 C) (Oral)   Resp (!) 24   Ht _0  (1.778 m)   Wt 65 kg   SpO2 97%   BMI 20.56 kg/m  Pain Scale: 0-10 POSS *See Group Information*: 1-Acceptable,Awake and alert Pain Score: 0-No pain   SpO2: SpO2: 97 % O2 Device:SpO2: 97 % O2 Flow Rate: .O2 Flow Rate (L/min): 2 L/min  IO: Intake/output summary:   Intake/Output Summary (Last 24 hours) at 07/27/2018 1441 Last data filed at 07/27/2018 0800 Gross per 24 hour  Intake 743.45 ml  Output 2575 ml  Net -1831.55 ml    LBM: Last BM Date: 07/25/18 Baseline Weight: Weight: 67.6 kg Most recent weight: Weight: 65 kg     Palliative Assessment/Data: 60%     Time In: 1245 Time Out: 1345 Time Total: 60 min Greater than 50%  of this time was spent counseling and coordinating care related to the above assessment and plan.  Signed by: Vinie Sill, NP Palliative Medicine Team Pager # [phone] (M-F 8a-5p) Team Phone # [phone] (Nights/Weekends)
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
## 2018-07-28 ENCOUNTER — Inpatient Hospital Stay (HOSPITAL_COMMUNITY): Payer: Medicare Other

## 2018-07-28 DIAGNOSIS — Z87891 Personal history of nicotine dependence: Secondary | ICD-10-CM

## 2018-07-28 DIAGNOSIS — I361 Nonrheumatic tricuspid (valve) insufficiency: Secondary | ICD-10-CM

## 2018-07-28 LAB — CBC WITH DIFFERENTIAL/PLATELET
ABS IMMATURE GRANULOCYTES: 0 10*3/uL (ref 0.0–0.1)
Basophils Absolute: 0.1 10*3/uL (ref 0.0–0.1)
Basophils Relative: 2 %
EOS PCT: 5 %
Eosinophils Absolute: 0.3 10*3/uL (ref 0.0–0.7)
HEMATOCRIT: 42.3 % (ref 39.0–52.0)
HEMOGLOBIN: 13.3 g/dL (ref 13.0–17.0)
Immature Granulocytes: 0 %
LYMPHS ABS: 2 10*3/uL (ref 0.7–4.0)
LYMPHS PCT: 33 %
MCH: 27.5 pg (ref 26.0–34.0)
MCHC: 31.4 g/dL (ref 30.0–36.0)
MCV: 87.4 fL (ref 78.0–100.0)
Monocytes Absolute: 1.1 10*3/uL — ABNORMAL HIGH (ref 0.1–1.0)
Monocytes Relative: 17 %
NEUTROS ABS: 2.7 10*3/uL (ref 1.7–7.7)
Neutrophils Relative %: 43 %
Platelets: 182 10*3/uL (ref 150–400)
RBC: 4.84 MIL/uL (ref 4.22–5.81)
RDW: 15.3 % (ref 11.5–15.5)
WBC: 6.1 10*3/uL (ref 4.0–10.5)

## 2018-07-28 LAB — COMPREHENSIVE METABOLIC PANEL
ALBUMIN: 3.1 g/dL — AB (ref 3.5–5.0)
ALT: 17 U/L (ref 0–44)
ANION GAP: 7 (ref 5–15)
AST: 25 U/L (ref 15–41)
Alkaline Phosphatase: 106 U/L (ref 38–126)
BUN: 13 mg/dL (ref 6–20)
CHLORIDE: 103 mmol/L (ref 98–111)
CO2: 30 mmol/L (ref 22–32)
Calcium: 9.4 mg/dL (ref 8.9–10.3)
Creatinine, Ser: 0.89 mg/dL (ref 0.61–1.24)
GFR calc Af Amer: >60 mL/min (ref >60)
GFR calc non Af Amer: >60 mL/min (ref >60)
Glucose, Bld: 88 mg/dL (ref 70–99)
POTASSIUM: 4.2 mmol/L (ref 3.5–5.1)
Sodium: 140 mmol/L (ref 135–145)
Total Bilirubin: 1.5 mg/dL — ABNORMAL HIGH (ref 0.3–1.2)
Total Protein: 5.9 g/dL — ABNORMAL LOW (ref 6.5–8.1)

## 2018-07-28 LAB — COOXEMETRY PANEL
CARBOXYHEMOGLOBIN: 1.6 % — AB (ref 0.5–1.5)
Carboxyhemoglobin: 1.6 % — ABNORMAL HIGH (ref 0.5–1.5)
METHEMOGLOBIN: 1.5 % (ref 0.0–1.5)
Methemoglobin: 1.4 % (ref 0.0–1.5)
O2 SAT: 54.4 %
O2 Saturation: 63 %
TOTAL HEMOGLOBIN: 14.2 g/dL (ref 12.0–16.0)
Total hemoglobin: 13.5 g/dL (ref 12.0–16.0)

## 2018-07-28 LAB — GLUCOSE, CAPILLARY
GLUCOSE-CAPILLARY: 89 mg/dL (ref 70–99)
GLUCOSE-CAPILLARY: 111 mg/dL — AB (ref 70–99)
GLUCOSE-CAPILLARY: 161 mg/dL — AB (ref 70–99)
Glucose-Capillary: 111 mg/dL — ABNORMAL HIGH (ref 70–99)

## 2018-07-28 LAB — AMMONIA: AMMONIA: 23 umol/L (ref 9–35)

## 2018-07-28 LAB — ECHOCARDIOGRAM COMPLETE
Height: 70 in
Weight: 2236.35 oz

## 2018-07-28 LAB — HEPARIN LEVEL (UNFRACTIONATED): HEPARIN UNFRACTIONATED: 0.53 [IU]/mL (ref 0.30–0.70)

## 2018-07-28 MED ORDER — TORSEMIDE 20 MG PO TABS
20.0000 mg | ORAL_TABLET | Freq: Every day | ORAL | Status: DC
  Administered 2018-07-28 – 2018-07-31 (×4): 20 mg via ORAL
  Filled 2018-07-28 (×4): qty 1

## 2018-07-28 NOTE — Progress Notes (Signed)
  Date: 07/28/2018  Patient name: Theodore Welch  Medical record number: [id number]  Date of birth: [birth year]   I have seen and evaluated this patient and I have discussed the plan of care with the house staff. Please see Dr. Godfrey Pick note for complete details. I concur with his findings.  Inez Catalina, MD 07/28/2018, 10:17 PM

## 2018-07-28 NOTE — Progress Notes (Addendum)
  CSW spoke with son's boss and emailed letter asking that pt son be excused from working Monday and Tuesday of next week to be present for patient surgery.   CSW informed by VAD coordinator that patient is requesting help getting his son, Theodore Welch [phone]), excused from work for his upcoming surgery.  CSW met with pt and confirmed that he would like CSW to contact his son's boss, Theodore Welch, to advocate that patient is excused from work Monday and Tuesday of next week.  CSW spoke with son's boss and emailed letter asking that pt son be excused from working Monday and Tuesday of next week to be present for patient surgery.   CSW will continue to follow   Jorge Ny, LCSW Clinical Social Worker

## 2018-07-28 NOTE — Progress Notes (Signed)
  Echocardiogram 2D Echocardiogram has been performed.  Theodore Welch 07/28/2018, 3:19 PM

## 2018-07-28 NOTE — Progress Notes (Signed)
ANTICOAGULATION CONSULT NOTE  Pharmacy Consult for heparin Indication: DVT  No Known Allergies  Patient Measurements: Height: 5\' 10"  (177.8 cm) Weight: 139 lb 12.4 oz (63.4 kg) IBW/kg (Calculated) : 73 Heparin Dosing Weight: 67.7 kg  Vital Signs: Temp: 98.5 F (36.9 C) (09/10 0758) Temp Source: Oral (09/10 0758) BP: 95/76 (09/10 0800) Pulse Rate: 97 (09/10 0758)  Labs: Recent Labs    07/26/18 0308 07/26/18 0856 07/27/18 0211 07/28/18 0338 07/28/18 0456  HGB 13.4  --  12.7*  --  13.3  HCT 41.1  --  39.7  --  42.3  PLT 169  --  166  --  182  HEPARINUNFRC  --  0.45 0.43 0.53  --   CREATININE 1.00  --  0.94  --  0.89    Estimated Creatinine Clearance: 80.1 mL/min (by C-G formula based on SCr of 0.89 mg/dL).   Assessment: 57 y.o. male with RLE DVT for heparin. No anticoagulation PTA.   Patient s/p RHC 9/6, original discussion was to restart apixaban, but plans have changed.   Heparin level continues to be therapeutic this morning at 0.53, on 1400 units/hr. Hgb stable. No s/sx of bleeding or issues with infusion reported by nursing.  Tentative plan for LVAD next Monday, will hold off on any oral anticoagulation for now.   Goal of Therapy:  Heparin level 0.3-0.7 units/ml Monitor platelets by anticoagulation protocol: Yes   Plan:  Continue heparin infusion to 1400 units/hr  Check anti-Xa level daily while on heparin Continue to monitor H&H and platelets  Sheppard Coil PharmD., BCPS Clinical Pharmacist 07/28/2018 8:07 AM

## 2018-07-28 NOTE — Progress Notes (Addendum)
Patient ID: Theodore Welch, male   DOB: 1961-02-22, 57 y.o.   MRN: [id number]     Advanced Heart Failure Rounding Note  PCP-Cardiologist: No primary care provider on file.   Subjective:    PICC line placed 07/21/18. Initial Co-ox 43%. Started on milrinone 0.25 mcg/kg/min, increased to 0.375 with persistently low co-ox.    Coox pending today on milrinone 0.375 mcg/kg/min. CVP ~3. Total Bilirubin 1.5. NH3 23.   Feeling OK this am. Denies SOB. No lightheadedness or dizziness. No palpitations.   Venous US 07/22/18 showed right peroneal vein DVT, now on heparin gtt.   PFTs 07/24/18  FVC 3.19 (79%) FEV1 2.47 (78%) DLCO 53% TLC 82%  cMRI 07/23/18 1.  Moderate dilated LV with EF 14%, severe diffuse hypokinesis. 2. Severely dilated RV with moderate to severe systolic dysfunction, EF 25%. 3. There mid-wall LGE at the inferior RV insertion site. This is a nonspecific LGE pattern and can be seen with pressure/volume Overload.  RHC Procedural Findings (9/7): Hemodynamics (on milrinone 0.375 mcg/kg/min) RA mean 8 RV 40/12 PA 40/19, mean 27 PCWP mean 12 CVP/PCWP 0.66 PAPi 2.65 Oxygen saturations: PA 70% AO 95% Cardiac Output (Fick) 5.84  Cardiac Index (Fick) 3.18 PVR 2.6 WU (Fick) CardiacOutput (Thermo) 4.97 Cardiac Index (Themo) 2.71  PVR 3 WU (Thermo)   Objective:    Weight Range: 63.4 kg Body mass index is 20.06 kg/m.   Vital Signs:   Temp:  [96.5 F (35.8 C)-98.5 F (36.9 C)] 98.5 F (36.9 C) (09/09 2300) Pulse Rate:  [94-98] 97 (09/09 2300) Resp:  [14-24] 23 (09/09 2300) BP: (98-105)/(63-85) 98/74 (09/09 2300) SpO2:  [91 %-98 %] 96 % (09/09 2300) Weight:  [63.4 kg] 63.4 kg (09/10 0453) Last BM Date: 07/27/18  Weight change: Filed Weights   07/26/18 0319 07/27/18 0357 07/28/18 0453  Weight: 63.2 kg 65 kg 63.4 kg   Intake/Output:   Intake/Output Summary (Last 24 hours) at 07/28/2018 0734 Last data filed at 07/28/2018 0300 Gross per 24 hour  Intake 507.17 ml    Output 2900 ml  Net -2392.83 ml    Physical Exam    General: NAD HEENT: Normal Neck: Supple. JVP not elevated. Carotids 2+ bilat; no bruits. No thyromegaly or nodule noted. Cor: PMI nondisplaced. RRR, 1/6 HSM apex.  Lungs: CTAB, normal effort. Abdomen: Soft, non-tender, non-distended, no HSM. No bruits or masses. +BS  Extremities: No cyanosis, clubbing, or rash. R and LLE no edema.  Neuro: Alert & orientedx3, cranial nerves grossly intact. moves all 4 extremities w/o difficulty. Affect pleasant   Telemetry   NSR 90s, personally reviewed.   EKG    No new tracings.    Labs    CBC Recent Labs    07/27/18 0211 07/28/18 0456  WBC 6.4 6.1  NEUTROABS  --  2.7  HGB 12.7* 13.3  HCT 39.7 42.3  MCV 86.5 87.4  PLT 166 182   Basic Metabolic Panel Recent Labs    41/93/79 0211 07/28/18 0456  NA 139 140  K 4.1 4.2  CL 101 103  CO2 30 30  GLUCOSE 96 88  BUN 14 13  CREATININE 0.94 0.89  CALCIUM 9.2 9.4   Liver Function Tests Recent Labs    07/28/18 0456  AST 25  ALT 17  ALKPHOS 106  BILITOT 1.5*  PROT 5.9*  ALBUMIN 3.1*   No results for input(s): LIPASE, AMYLASE in the last 72 hours. Cardiac Enzymes No results for input(s): CKTOTAL, CKMB, CKMBINDEX, TROPONINI in the last  72 hours.  BNP: BNP (last 3 results) Recent Labs    07/21/18 0540  BNP 1,343.9*    ProBNP (last 3 results) No results for input(s): PROBNP in the last 8760 hours.   D-Dimer No results for input(s): DDIMER in the last 72 hours. Hemoglobin A1C No results for input(s): HGBA1C in the last 72 hours. Fasting Lipid Panel No results for input(s): CHOL, HDL, LDLCALC, TRIG, CHOLHDL, LDLDIRECT in the last 72 hours. Thyroid Function Tests No results for input(s): TSH, T4TOTAL, T3FREE, THYROIDAB in the last 72 hours.  Invalid input(s): FREET3  Other results:   Imaging    No results found.   Medications:     Scheduled Medications: . aspirin EC  81 mg Oral Daily  . atorvastatin   10 mg Oral Daily  . digoxin  0.125 mg Oral Daily  . DULoxetine  60 mg Oral Daily  . feeding supplement (ENSURE ENLIVE)  237 mL Oral BID BM  . gabapentin  600 mg Oral TID  . insulin aspart  0-9 Units Subcutaneous TID WC  . losartan  12.5 mg Oral BID  . pantoprazole  40 mg Oral Daily  . sodium chloride flush  10-40 mL Intracatheter Q12H  . sodium chloride flush  3 mL Intravenous Q12H  . sodium chloride flush  3 mL Intravenous Q12H  . spironolactone  25 mg Oral Daily  . thiamine  100 mg Oral Daily  . torsemide  40 mg Oral Daily    Infusions: . sodium chloride Stopped (07/25/18 2100)  . heparin 1,400 Units/hr (07/27/18 1624)  . milrinone 0.375 mcg/kg/min (07/28/18 0057)    PRN Medications: sodium chloride, acetaminophen **OR** acetaminophen, hydrOXYzine, ipratropium-albuterol, methocarbamol, nicotine, ondansetron (ZOFRAN) IV, senna-docusate, sodium chloride flush, sodium chloride flush    Patient Profile   [name] is a 57 y.o. 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  Admitted with worsening SOB and orthopnea in setting of severe biventricular CHF.   Assessment/Plan   1. Acute on chronic systolic CHF:  Nonischemic cardiomyopathy based on cath 7/19 in [place]. Remote heavy ETOH, says he has quit drinking completely for about 2 months now. HIV negative.  No cocaine/amphetamines.  Echo was done this admission with biventricular failure, LV EF 10-20% with dilated RV. There is moderate to severe MR and severe TR with poor coaptation of TV leaflets.  On exam, he was markedly volume overloaded initially.  Low output HF indicated by low Co-ox of 43% initially.  cMRI 07/23/18 LVEF EF 14%, Severe RF dysfunction (EF 25%) with nonspecific RV insertion site LGE pattern likely from pressure/volume overload. RHC showed good cardiac output on milrinone with filling pressures nearing normal.  PAPi 2.65 suggests that RV  function is better than expected from imaging.   - Last co-ox 72% and stable CVP ~3  - Will cut torsemide back to 20 mg daily with prodigious UOP - Continue milrinone 0.375.  - No beta blocker with low output.  - Continue spironolactone 25 mg daily.  - Continue losartan 12.5 mg BID, no BP room to titrate.  - Continue digoxin 0.125 daily.  - Suspect that he will need for advanced therapies.  I think that he is going to be milrinone-dependent.  He has been noted to have cirrhosis on RUQ [name] and CT and has significant RV dysfunction.  - INR 1.31 07/25/18, so liver synthetic function is not markedly abnormal.  He was smoking marijuana and occasional cigarettes  prior to admission, so will not be a transplant candidate until abstinent 6 months. Do not think that he has that long to wait.  - RHC on milrinone and with diuresis on 9/6 looked good, PAPi was 2.65 which seems to indicate adequate RV function. We have initiated LVAD workup, he has been seen by Dr. Laneta Simmers.   - Discussed at VAD MRB 07/27/18 and approved. Plan tentatively for LVAD 08/03/18 (Monday) - He had been wearing Lifevest at home with h/o NSVT.   2. OSA:  - Continue CPAP qhs.   3. COPD:  He had mostly quit smoking before admission but still smoked a few cigarettes/week.  - PFTs 07/24/18 FVC 3.19 (79%), FEV1 2.47 (78%), DLCO 53%, TLC 82% 4. 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 ?component of ETOH cirrhosis given prior heavy ETOH (has now quit). RUQ US showed ascites with evidence for cirrhosis. NH3 with initial elevation at 64 on 9/3. CT abdomen showed resolution of ascites but there was evidence for cirrhosis.  -  INR 1.31 07/25/18, adequate liver synthetic function.  - Sent viral hepatitis labs => Negative.  - Total bili 1.5 today. Improved with decongestion (Improved from 3.5 on 07/22/18).  - NH3 down to 23.  5. RLE DVT:  He has a right peroneal vein (below knee) DVT.  Even though below knee, think he is a relatively high  risk patient with severe cardiomyopathy.  Would favor 6 months anticoagulation.  - Will leave on heparin gtt for now due to possibility of LVAD this admission.  6. Valvular heart disease: Moderate to severe MR and severe TR on echo.  Suspect this is functional due to annular dilation. Hopefully TR has improved with diuresis.  - Repeat echo now that he has been diuresed to reassess TR. May need repair at same time as VAD.  Continue current course of optimization for tentative LVAD 08/03/18. Repeat Echo pending. Bilirubin improved with decongestion.   Graciella Freer, PA-C  07/28/2018 7:34 AM  Advanced Heart Failure Team Pager [phone] (M-F; 7a - 4p)  Please contact [facility] for night-coverage after hours (4p -7a ) and weekends on [email or web address]  Patient seen with NP, agree with the above note.   He continues on milrinone 0.375.  Excellent UOP yesterday, CVP 3.  Pending co-ox today.   On exam, no JVD. No edema.  Regular S1S2, no S3.   Repeat tbili down to 1.5 and NH3 to 23.  He has responded well to decongestion.    CVP 3, vigorous UOP yesterday.  - Continue milrinone.  - Decrease torsemide to 20 mg daily.   PFTs adequate for LVAD surgery.   We discussed patient at [facility] yesterday, tentatively posted for LVAD on Monday.  Discussed with patient and girlfriend today.   Marca Ancona 07/28/2018 8:30 AM

## 2018-07-28 NOTE — Progress Notes (Signed)
MCS EDUCATION NOTE:   Provided brief equipment overview and demonstration with Heartmate III training loop including discussion on the following:  a) power module  b) system controller  c) universal Magazine features editor  d) battery clips  e) Batteries  f) Perc lock  g) Percutaneous lead   Demonstrated and discussed:  a) changing power source on system controller from tethered (power module) to untethered (battery) mode  b) changing power source on system controller from untethered (battery) to tethered (power module) mode  c) how to monitor battery life both on the system controller and on each individual battery  d) changing batteries   Reviewed and supplied a copy of home inspection check list stressing that only three pronged grounded power outlets can be used for VAD equipment. [name] confirmed home has electrical outlets that will support the equipment.   Identified the following lifestyle modifications while living on MCS:   1. No tub baths while pump implanted, and shower only when doctor gives permission.  2. No swimming or submersion in water while implanted with pump.  3. No contact sports or engaging in jumping activities.  4. Always have a backup controller, charged spare batteries, and battery clips nearby at all times in case of emergency.  5. Plan to sleep only when connected to the power module.  6. Keep a backup system controller, charged batteries, battery clips, and flashlight near you during sleep in case of electrical power outage.   Had patient and his girlfriend Clinical biochemist with mock power source connecter.   All questions have been answered at this time and contact information was provided should they encounter any further questions.  Mr. Rupani verbalized information of all teaching topics today.   Alyce Pagan RN VAD Coordinator  Office: [phone]  24/7 Pager: [phone]

## 2018-07-28 NOTE — Progress Notes (Addendum)
Subjective:   Patient continued to be worked up for LVAD yesterday. He was seen by social work and palliative care. He also went for PFT's  Theodore Welch feels well this morning. He reports he will be drinking his ensure while waiting for LVAD surgery planned for Monday.   Objective:  Vital signs in last 24 hours: Vitals:   07/28/18 0453 07/28/18 0758 07/28/18 0800 07/28/18 1112  BP:  95/74 95/76 (!) 85/64  Pulse:  97  95  Resp:  18 (!) 21 20  Temp:  98.5 F (36.9 C)  98 F (36.7 C)  TempSrc:  Oral  Oral  SpO2:      Weight: 63.4 kg     Height:       Weight change: -1.6 kg  Intake/Output Summary (Last 24 hours) at 07/28/2018 1115 Last data filed at 07/28/2018 1100 Gross per 24 hour  Intake 855.25 ml  Output 4525 ml  Net -3669.75 ml   Physical Exam General: Alert, energetic mood Heart: rrr, no mrg Lungs: CTAB. No w/r/r Extremites: No edema, clubbing cyanosis    Assessment/Plan:  Principal Problem:   CHF (congestive heart failure) (HCC) Active Problems:   Tobacco use   Congestive heart failure (HCC)   Type 2 diabetes mellitus (HCC)   Hyperlipidemia   Ascites   Malnutrition of moderate degree   Goals of care, counseling/discussion   Advance care planning   Palliative care encounter  Theodore Welch is a 57yo M with DM2,HTN, HLD, COPD, who presents with worsening of severe biventricular heart failure. Patient presented with PND,orthopnea, and LE edema that has worsened over the past 2-3 weeks. He is feeling better with diuresisand new milrinone. Netnegative12Lsinceadmission.Co-ox improved on milrinone.C-MRI on 9/5 without signs of infiltrative disease. RHC on 9/6 showed good cardiac output and CT surgery plans for LVAD placement on Monday.  Assessment and Plan: Severe biventricular CHF 2/2 NICM C-MRIon 09/05without signs of infiltrative disease. CT Surgery working patient up for LVAD surgery on Monday. Will still need to  considerevaluationfortransplant.  -Following cardiology-heart failure recommendations - Patient wearing Lifevest at home - Continue digoxin0.125. Milrinone.375, ASA81mg ,losartan12.5mg  BID, spirolactone 25mg ,  -Cutback Torsemide to 20 mgtoday as per Heart Failure.   RLE DVT Right peroneal DVT. Plan for 6 months anticoagulation. -Continue heparin gtt for now with LVAD placement planned for Monday  Cirrhosis  RUQ [name] 9/3 shows cirrhotic changes.Heavy alcohol use off and on for many years. He has quit drinking for 4 months now. Hepatitis panel neg.Likely worsened 2/2 congestive heart failure.Neg hep B antibody.TBili and NH3 improved with diuresis.  INR 1.31 on 9/7. - will need immunization of hep B in future  DM2 CBGs controlled.Hgb A1c 6.5%. Holding home Metformin 500 mg BID.  - HH/Carb mod diet - Sensitive sling-scale insulinbut CBG's contolled  Anxiety - Continue home Cymbalta (also for chronic low back pain) - Continue home PRN Hydroxyzine 25mg  TID    COPD (not supported on PFT's)  PFT's not consistent with COPD diagnosis. PFT's show moderate diffusion defect and minimal obstructive airway disease. Patient has quit smoking for months. Acceptable study with good patient effort noted and Hgb value 12.7. FEV1/FVC (99% predicted) , FVC (79% pred) , FEV1 (78% pred) , TLC (82% pred) , DLCO (53% pred)  Dispo: Pending LVAD  Diet: HH/Carb mod DVT [id number] gtt IVF: None Code: Full   LOS: 7 days   Gardenia Phlegm, Medical Student 07/28/2018, 11:15 AM   Attestation for Student Documentation:  I personally was present and performed  or re-performed the history, physical exam and medical decision-making activities of this service and have verified that the service and findings are accurately documented in the student's note.  Lanelle Bal, MD 07/28/2018, 7:10 PM

## 2018-07-28 NOTE — Progress Notes (Signed)
Palliative:  I met again today with Theodore Welch and his significant other, Theodore Welch, is at bedside. They are in good spirits in preparation for LVAD implant on Monday. He says that he is a little anxious but happy to have a plan in place. Theodore Welch says that this evening is her last day to work as she is quitting her job to care for him. He has an excellent support system.   I asked them if they had reviewed or had a chance to speak about HCPOA/Living Will and if so if they had any questions. They have not reviewed yet but say they plan to review today or tomorrow (although Theodore Welch talks about reviewing this weekend when his siblings and other family are present). I encouraged him to review and discussed the importance of HCPOA and that as it stands his children would be his decision makers in the event that he cannot speak for himself. I also explained that since he has multiple children if faced with decisions it could be difficult if they disagree and recommend a clear HCPOA of his choosing.   All questions/concerns addressed. Emotional support provided.   Exam: Alert, oriented. No distress. Sitting up in recliner in good spirits.   25 min  Alicia Parker, NP Palliative Medicine Team Pager # [phone] (M-F 8a-5p) Team Phone # [phone] (Nights/Weekends) 

## 2018-07-28 NOTE — Progress Notes (Signed)
CSW completed LVAD psychosocial assessment with patient and caregiver. Presented findings in MRB.  Patient and caregiver motivated to get LVAD and have good understanding of procedure and recovery process.  CSW will continue to follow and assist as needed.  Burna Sis, LCSW Clinical Social Worker [phone]

## 2018-07-28 NOTE — Care Management Note (Signed)
Case Management Note  Patient Details  Name: Theodore Welch MRN: [id number] Date of Birth: 1961-07-27  Subjective/Objective:   PT admitted with advanced HF - tentative plan is for pt to receive LVAD                 Action/Plan:  PTA Independent from home.  Pt has PCP and denies barriers with obtaining and paying for medications.     Expected Discharge Date:                  Expected Discharge Plan:     In-House Referral:  Clinical Social Work  Discharge planning Services  CM Consult  Post Acute Care Choice:    Choice offered to:     DME Arranged:    DME Agency:     HH Arranged:    HH Agency:     Status of Service:     If discussed at [employer], dates discussed:    Additional Comments:  Cherylann Parr, RN 07/28/2018, 12:57 PM

## 2018-07-28 NOTE — Progress Notes (Signed)
RT offered to place pt on CPAP for the night pt states he will do it himself that he was not quite ready to go on yet. RT will continue to monitor.

## 2018-07-28 NOTE — Progress Notes (Signed)
CARDIAC REHAB PHASE I   PRE:  Rate/Rhythm: 102 ST    BP: sitting 109/78    SaO2: 100 RA  MODE:  Ambulation: 870 ft in 6 minutes  POST:  Rate/Rhythm: 105 ST    BP: sitting 124/88     SaO2: 100 RA  Pt has been walking independently. sts he likes to walk. Ambulated independently with me, 870 ft in 6 minutes. No c/o, sts he doesn't have SOB anymore. Pt practiced IS, 1500 mL. He was excited to have something to work on. Discussed sternal precautions. Pt has good reception. Encouraged him to keep walking independently and we will f/u as time allows. [id number]   Harriet Masson CES, ACSM 07/28/2018 2:45 PM
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
## 2018-07-29 DIAGNOSIS — K7469 Other cirrhosis of liver: Secondary | ICD-10-CM

## 2018-07-29 LAB — COMPREHENSIVE METABOLIC PANEL
ALT: 19 U/L (ref 0–44)
AST: 25 U/L (ref 15–41)
Albumin: 3 g/dL — ABNORMAL LOW (ref 3.5–5.0)
Alkaline Phosphatase: 97 U/L (ref 38–126)
Anion gap: 10 (ref 5–15)
BILIRUBIN TOTAL: 1.6 mg/dL — AB (ref 0.3–1.2)
BUN: 19 mg/dL (ref 6–20)
CHLORIDE: 102 mmol/L (ref 98–111)
CO2: 26 mmol/L (ref 22–32)
Calcium: 9.3 mg/dL (ref 8.9–10.3)
Creatinine, Ser: 1.06 mg/dL (ref 0.61–1.24)
GFR calc Af Amer: >60 mL/min (ref >60)
GFR calc non Af Amer: >60 mL/min (ref >60)
GLUCOSE: 100 mg/dL — AB (ref 70–99)
POTASSIUM: 4.3 mmol/L (ref 3.5–5.1)
Sodium: 138 mmol/L (ref 135–145)
Total Protein: 5.6 g/dL — ABNORMAL LOW (ref 6.5–8.1)

## 2018-07-29 LAB — GLUCOSE, CAPILLARY
GLUCOSE-CAPILLARY: 121 mg/dL — AB (ref 70–99)
GLUCOSE-CAPILLARY: 143 mg/dL — AB (ref 70–99)
Glucose-Capillary: 83 mg/dL (ref 70–99)
Glucose-Capillary: 123 mg/dL — ABNORMAL HIGH (ref 70–99)

## 2018-07-29 LAB — COOXEMETRY PANEL
Carboxyhemoglobin: 1.5 % (ref 0.5–1.5)
Methemoglobin: 1.4 % (ref 0.0–1.5)
O2 SAT: 65.3 %
TOTAL HEMOGLOBIN: 12.8 g/dL (ref 12.0–16.0)

## 2018-07-29 LAB — HEPARIN LEVEL (UNFRACTIONATED): Heparin Unfractionated: 0.47 IU/mL (ref 0.30–0.70)

## 2018-07-29 LAB — HEPATITIS C ANTIBODY

## 2018-07-29 NOTE — Progress Notes (Addendum)
Patient ID: Theodore Welch, male   DOB: May 06, 1961, 57 y.o.   MRN: [id number]     Advanced Heart Failure Rounding Note  PCP-Cardiologist: No primary care provider on file.   Subjective:    PICC line placed 07/21/18. Initial Co-ox 43%. Started on milrinone 0.25 mcg/kg/min, increased to 0.375 with persistently low co-ox.    Coox 65.3% and 0.375 mcg/kg/min. CVP 4-5 Total Bilirubin 1.5. NH3 23.   Feeling OK this am. Denies lightheadedness or dizziness. No SOB. Had some R groin soreness after walking halls yesterday. Better now.   Venous US 07/22/18 showed right peroneal vein DVT, now on heparin gtt.   Echo 07/28/18 LVEF 20-25%, Grade 2 DD, mild to mod MR, Mod LAE, Mod RAE, Mod RV dilation, PA peak pressure 51 mm Hg, moderate TR.   PFTs 07/24/18  FVC 3.19 (79%) FEV1 2.47 (78%) DLCO 53% TLC 82%  cMRI 07/23/18 1.  Moderate dilated LV with EF 14%, severe diffuse hypokinesis. 2. Severely dilated RV with moderate to severe systolic dysfunction, EF 25%. 3. There mid-wall LGE at the inferior RV insertion site. This is a nonspecific LGE pattern and can be seen with pressure/volume Overload.  RHC Procedural Findings (9/7): Hemodynamics (on milrinone 0.375 mcg/kg/min) RA mean 8 RV 40/12 PA 40/19, mean 27 PCWP mean 12 CVP/PCWP 0.66 PAPi 2.65 Oxygen saturations: PA 70% AO 95% Cardiac Output (Fick) 5.84  Cardiac Index (Fick) 3.18 PVR 2.6 WU (Fick) CardiacOutput (Thermo) 4.97 Cardiac Index (Themo) 2.71  PVR 3 WU (Thermo)  Objective:    Weight Range: 64.1 kg Body mass index is 20.28 kg/m.   Vital Signs:   Temp:  [97.9 F (36.6 C)-98.5 F (36.9 C)] 98 F (36.7 C) (09/11 0340) Pulse Rate:  [95-99] 95 (09/10 2025) Resp:  [15-26] 15 (09/11 0340) BP: (85-106)/(64-76) 86/70 (09/11 0340) SpO2:  [94 %] 94 % (09/10 2025) Weight:  [64.1 kg] 64.1 kg (09/11 0340) Last BM Date: 07/28/18  Weight change: Filed Weights   07/27/18 0357 07/28/18 0453 07/29/18 0340  Weight: 65 kg 63.4 kg 64.1  kg   Intake/Output:   Intake/Output Summary (Last 24 hours) at 07/29/2018 0729 Last data filed at 07/29/2018 0036 Gross per 24 hour  Intake 1162.32 ml  Output 1875 ml  Net -712.68 ml    Physical Exam    General: Well appearing. No resp difficulty. HEENT: Normal Neck: Supple. JVP 5-6. Carotids 2+ bilat; no bruits. No thyromegaly or nodule noted. Cor: PMI nondisplaced. RRR, 1/6 HSM apex.  Lungs: CTAB, normal effort. Abdomen: Soft, non-tender, non-distended, no HSM. No bruits or masses. +BS  Extremities: No cyanosis, clubbing, or rash. R and LLE no edema.  Neuro: Alert & orientedx3, cranial nerves grossly intact. moves all 4 extremities w/o difficulty. Affect pleasant   Telemetry   NSR 90s, personally reviewed.   EKG    No new tracings.    Labs    CBC Recent Labs    07/27/18 0211 07/28/18 0456  WBC 6.4 6.1  NEUTROABS  --  2.7  HGB 12.7* 13.3  HCT 39.7 42.3  MCV 86.5 87.4  PLT 166 182   Basic Metabolic Panel Recent Labs    46/96/29 0456 07/29/18 0302  NA 140 138  K 4.2 4.3  CL 103 102  CO2 30 26  GLUCOSE 88 100*  BUN 13 19  CREATININE 0.89 1.06  CALCIUM 9.4 9.3   Liver Function Tests Recent Labs    07/28/18 0456 07/29/18 0302  AST 25 25  ALT 17  19  ALKPHOS 106 97  BILITOT 1.5* 1.6*  PROT 5.9* 5.6*  ALBUMIN 3.1* 3.0*   No results for input(s): LIPASE, AMYLASE in the last 72 hours. Cardiac Enzymes No results for input(s): CKTOTAL, CKMB, CKMBINDEX, TROPONINI in the last 72 hours.  BNP: BNP (last 3 results) Recent Labs    07/21/18 0540  BNP 1,343.9*    ProBNP (last 3 results) No results for input(s): PROBNP in the last 8760 hours.   D-Dimer No results for input(s): DDIMER in the last 72 hours. Hemoglobin A1C No results for input(s): HGBA1C in the last 72 hours. Fasting Lipid Panel No results for input(s): CHOL, HDL, LDLCALC, TRIG, CHOLHDL, LDLDIRECT in the last 72 hours. Thyroid Function Tests No results for input(s): TSH, T4TOTAL,  T3FREE, THYROIDAB in the last 72 hours.  Invalid input(s): FREET3  Other results:   Imaging    No results found.   Medications:     Scheduled Medications: . aspirin EC  81 mg Oral Daily  . atorvastatin  10 mg Oral Daily  . digoxin  0.125 mg Oral Daily  . DULoxetine  60 mg Oral Daily  . feeding supplement (ENSURE ENLIVE)  237 mL Oral BID BM  . gabapentin  600 mg Oral TID  . insulin aspart  0-9 Units Subcutaneous TID WC  . losartan  12.5 mg Oral BID  . pantoprazole  40 mg Oral Daily  . sodium chloride flush  10-40 mL Intracatheter Q12H  . sodium chloride flush  3 mL Intravenous Q12H  . sodium chloride flush  3 mL Intravenous Q12H  . spironolactone  25 mg Oral Daily  . thiamine  100 mg Oral Daily  . torsemide  20 mg Oral Daily    Infusions: . sodium chloride Stopped (07/25/18 2100)  . heparin 1,400 Units/hr (07/29/18 0539)  . milrinone 0.375 mcg/kg/min (07/29/18 0400)    PRN Medications: sodium chloride, acetaminophen **OR** acetaminophen, hydrOXYzine, ipratropium-albuterol, methocarbamol, nicotine, ondansetron (ZOFRAN) IV, senna-docusate, sodium chloride flush, sodium chloride flush    Patient Profile   Theodore Welch is a 57 y.o. 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  Admitted with worsening SOB and orthopnea in setting of severe biventricular CHF.   Assessment/Plan   1. Acute on chronic systolic CHF:  Nonischemic cardiomyopathy based on cath 7/19 in [place]. Remote heavy ETOH, says he has quit drinking completely for about 2 months now. HIV negative.  No cocaine/amphetamines.  Echo was done this admission with biventricular failure, LV EF 10-20% with dilated RV. There is moderate to severe MR and severe TR with poor coaptation of TV leaflets.  On exam, he was markedly volume overloaded initially.  Low output HF indicated by low Co-ox of 43% initially.  cMRI 07/23/18 LVEF EF 14%, Severe RF  dysfunction (EF 25%) with nonspecific RV insertion site LGE pattern likely from pressure/volume overload. RHC showed good cardiac output on milrinone with filling pressures nearing normal.  PAPi 2.65 suggests that RV function is better than expected from imaging.  Echo on 9/10 after diuresis showed EF 20-25%, RV looked better, mild to moderate MR, moderate TR.  - Coox 65.3% on milrinone 0.375 mcg/kg/min. CVP 4-5 this am.  - Continue torsemide 20 mg daily.  - No beta blocker with low output.  - Continue spironolactone 25 mg daily.  - Continue losartan 12.5 mg BID, no BP room to titrate.  - Continue digoxin 0.125 daily.  - Suspect that he will  need for advanced therapies. Think that he is going to be milrinone-dependent.  He has been noted to have cirrhosis on RUQ [name] and CT and has significant RV dysfunction.  - INR 1.31 07/25/18, so liver synthetic function is not markedly abnormal.  He was smoking marijuana and occasional cigarettes prior to admission, so will not be a transplant candidate until abstinent 6 months. Do not think that he has that long to wait.  - RHC on milrinone and with diuresis on 9/6 looked good, PAPi was 2.65 which seems to indicate adequate RV function. We have initiated LVAD workup, he has been seen by Dr. Laneta Simmers.   - Discussed at VAD MRB 07/27/18 and approved. Plan tentatively for LVAD 08/03/18 (Monday) - He had been wearing Lifevest at home with h/o NSVT.   - Cardiac Rehab following.  2. OSA:  - Continue CPAP qhs.   3. COPD:  He had mostly quit smoking before admission but still smoked a few cigarettes/week.  - PFTs 07/24/18 FVC 3.19 (79%), FEV1 2.47 (78%), DLCO 53%, TLC 82% - No change to current plan.   4. 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 ?component of ETOH cirrhosis given prior heavy ETOH (has now quit). RUQ US showed ascites with evidence for cirrhosis. NH3 with initial elevation at 64 on 9/3. CT abdomen showed resolution of ascites but there was  evidence for cirrhosis.  -  INR 1.31 07/25/18, adequate liver synthetic function.  - Sent viral hepatitis labs => Negative.  - Total bili 1.5 07/28/18. Improved with decongestion (Improved from 3.5 on 07/22/18).  - NH3 down to 23.  5. RLE DVT:  He has a right peroneal vein (below knee) DVT.  Even though below knee, think he is a relatively high risk patient with severe cardiomyopathy.  Would favor 6 months anticoagulation.  - Will leave on heparin gtt for now due to possibility of LVAD this admission.  6. Valvular heart disease: Moderate to severe MR and severe TR on initial echo.  Suspect this is functional due to annular dilation.  Repeat echo after diuresis on 9/10 showed mild-moderate MR, moderate TR.    Continue current course of optimization for tentative LVAD 08/03/18.   Graciella Freer, PA-C  07/29/2018 7:29 AM  Advanced Heart Failure Team Pager [phone] (M-F; 7a - 4p)  Please contact [facility] for night-coverage after hours (4p -7a ) and weekends on [email or web address]  Patient seen with NP, agree with the above note.   He continues on milrinone 0.375.  Excellent UOP yesterday, CVP 4-5.  Co-ox 63% today.   On exam, no JVD. No edema.  Regular S1S2, no S3.   Repeat tbili down to 1.5 and NH3 to 23.  He has responded well to decongestion.    CVP 4-5.  - Continue milrinone.  - Continue torsemide 20 mg daily.  - BP too soft to titrate meds.   Echo 9/10 showed improved RV function with mild-moderate MR, moderate TR now.   PFTs adequate for LVAD surgery.   Plan for LVAD on Monday.   Marca Ancona 07/29/2018 9:55 AM

## 2018-07-29 NOTE — Progress Notes (Signed)
  Date: 07/29/2018  Patient name: Theodore Welch  Medical record number: [id number]  Date of birth: 07-21-1961   I have seen and evaluated this patient and I have discussed the plan of care with the house staff. Please see Dr. Crista Elliot and MS4 Steen's note for complete details. I concur with their findings.  Inez Catalina, MD 07/29/2018, 5:12 PM

## 2018-07-29 NOTE — Progress Notes (Signed)
ANTICOAGULATION CONSULT NOTE  Pharmacy Consult for heparin Indication: DVT  No Known Allergies  Patient Measurements: Height: 5\' 10"  (177.8 cm) Weight: 141 lb 5 oz (64.1 kg) IBW/kg (Calculated) : 73 Heparin Dosing Weight: 67.7 kg  Vital Signs: Temp: 98 F (36.7 C) (09/11 0340) Temp Source: Oral (09/11 0340) BP: 86/70 (09/11 0340)  Labs: Recent Labs    07/27/18 0211 07/28/18 0338 07/28/18 0456 07/29/18 0302  HGB 12.7*  --  13.3  --   HCT 39.7  --  42.3  --   PLT 166  --  182  --   HEPARINUNFRC 0.43 0.53  --  0.47  CREATININE 0.94  --  0.89 1.06    Estimated Creatinine Clearance: 68 mL/min (by C-G formula based on SCr of 1.06 mg/dL).   Assessment: 57 y.o. male with RLE DVT for heparin. No anticoagulation PTA.   Patient s/p RHC 9/6, original discussion was to restart apixaban, but plans have changed.   Heparin level continues to be therapeutic this morning at 0.47, on 1400 units/hr. Hgb stable. No bleeding issues noted.   Tentative plan for LVAD next Monday, will hold off on any oral anticoagulation for now.   Goal of Therapy:  Heparin level 0.3-0.7 units/ml Monitor platelets by anticoagulation protocol: Yes   Plan:  Continue heparin infusion to 1400 units/hr  Check anti-Xa level daily while on heparin Continue to monitor H&H and platelets  Sheppard Coil PharmD., BCPS Clinical Pharmacist 07/29/2018 10:31 AM

## 2018-07-29 NOTE — Progress Notes (Signed)
CSW met with patient at bedside to check in.  CSW introduced patient to VAD CSW, Kennyth Lose, and further discussed upcoming surgery.  Patient remains optimistic about his surgery and seems anxious to get it over with.  No questions or concerns reported at this time.  CSW will continue to follow and assist as needed  Jorge Ny, LCSW Clinical Social Worker

## 2018-07-29 NOTE — Care Management Note (Signed)
Case Management Note  Patient Details  Name: Theodore Welch MRN: [id number] Date of Birth: Nov 11, 1961  Subjective/Objective:   PT admitted with advanced HF - tentative plan is for pt to receive LVAD                 Action/Plan:  PTA Independent from home.  Pt has PCP and denies barriers with obtaining and paying for medications.     Expected Discharge Date:                  Expected Discharge Plan:     In-House Referral:  Clinical Social Work  Discharge planning Services  CM Consult  Post Acute Care Choice:    Choice offered to:     DME Arranged:    DME Agency:     HH Arranged:    HH Agency:     Status of Service:     If discussed at [employer], dates discussed:    Additional Comments: 07/29/2018 Plan is for LVAD 08/03/18 Cherylann Parr, RN 07/29/2018, 9:33 AM

## 2018-07-29 NOTE — Progress Notes (Signed)
Subjective:  Palliative care followed up with patient yesterday regarding HCPOA/livinq will and he says he will have his nieces working in healthcare review the information this weekend. He was able to walk 870 ft in 6 minutes on walk test and started LVAD training.   Patient continues to look well on milrinone and current management.    Objective:  Vital signs in last 24 hours: Vitals:   07/28/18 2309 07/29/18 0300 07/29/18 0340 07/29/18 1132  BP: 91/70  (!) 86/70 102/73  Pulse:      Resp: (!) 23 18 15  (!) 24  Temp: 98.2 F (36.8 C)  98 F (36.7 C) (!) 97.4 F (36.3 C)  TempSrc: Oral  Oral Oral  SpO2:      Weight:   64.1 kg   Height:       Weight change: 0.7 kg  Intake/Output Summary (Last 24 hours) at 07/29/2018 1145 Last data filed at 07/29/2018 1014 Gross per 24 hour  Intake 814.24 ml  Output 1950 ml  Net -1135.76 ml   Physical Exam Gen: NAD, pleasant mood, sitting in chair Heart: RRR, no mrg Lungs: CTAB, no w/r/r Ext: no edema, cyanosis, clubbing   Assessment/Plan:  Principal Problem:   CHF (congestive heart failure) (HCC) Active Problems:   Tobacco use   Congestive heart failure (HCC)   Type 2 diabetes mellitus (HCC)   Hyperlipidemia   Ascites   Malnutrition of moderate degree   Goals of care, counseling/discussion   Advance care planning   Palliative care encounter  Theodore Welch is a 57yo M with DM2,HTN, HLD, COPD, who presents with worsening of severe biventricular heart failure. Patient presented with PND,orthopnea, and LE edema that has worsened over the past 2-3 weeks. Initial Co-ox 43% and LV EF on TTE was 10-20%. RV was dilated , moderate to severe MR and severe TR. Co-ox improved on milrinone.C-MRI on 9/5 without signs of infiltrative disease. Patient was negative 8.6L  on 9/6. RHC on 9/6 showed good cardiac output . TEE obtained on 9/10 showed improved EF to 20-25% , improved RV , and improved AV valve regurgitation. Patient is likely dependent  on milrinone and CT surgery consulted. CT surgery plans for LVAD placement on Monday 9/16,  continues to work patient up for this surgery, and to provide education/ training on LVAD.   Assessment and Plan: Severe biventricular CHF 2/2 NICM CT Surgery working patient up for LVAD surgery on Monday.Will still need to considerevaluationfortransplant.  -Following cardiology-heart failure recommendations - Patient wearing Lifevest at home - Continue digoxin0.125. Milrinone.375, ASA81mg ,losartan12.5mg  BID, spirolactone 25mg ,  -ContinueTorsemide 20 mgas per Heart Failure.  RLE DVT Right peroneal DVT. Plan for 6 months anticoagulation. 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 Likely worsened 2/2 congestive heart failure.He has quit drinking for 4 months now. Hepatitis panel neg,Neg hep B antibody, TBili and NH3 improved with diuresis,  INR 1.31 on 9/7. - will need immunization of hep B in future  DM2 CBGs controlled.Hgb A1c 6.5%. Holding home Metformin 500 mg BID.  - HH/Carb mod diet - Sensitive sling-scale insulinbut CBG's contolled  Anxiety - Continue home Cymbalta (also for chronic low back pain) - Continue home PRN Hydroxyzine 25mg  TID    COPD (not supported on PFT's)  PFT's not consistent with COPD diagnosis. PFT's show moderate diffusion defect and minimal obstructive airway disease. Patient has quit  smoking for months. Acceptable study with good patient effort noted and Hgb value 12.7. FEV1/FVC (99% predicted) , FVC (79% pred) , FEV1 (78% pred) , TLC (82% pred) , DLCO (53% pred)  Dispo: Pending LVAD Diet: HH/Carb mod DVT LOV:FIEPPIR gtt IVF: None   LOS: 8 days   Theodore Welch, Medical Student 07/29/2018, 11:45 AM

## 2018-07-29 NOTE — Progress Notes (Signed)
Nutrition Follow-up  DOCUMENTATION CODES:   Non-severe (moderate) malnutrition in context of chronic illness  INTERVENTION:   -Continue Ensure Enlive po BID, each supplement provides 350 kcal and 20 grams of protein  NUTRITION DIAGNOSIS:   Increased nutrient needs related to catabolic illness(End stage HF, cirrhosis) as evidenced by moderate fat depletion, moderate muscle depletion, percent weight loss.  Ongoing  GOAL:   Patient will meet greater than or equal to 90% of their needs  Progressing  MONITOR:   PO intake, Supplement acceptance, Labs, Weight trends  REASON FOR ASSESSMENT:   Consult LVAD Eval  ASSESSMENT:   57 y/o male PMHx DM2, HTN/HLD, OSA on CPAP, CHF, Cirrhosis, ongoing Tobacco abuse, past etoh abuse (3 mo ago). Diagnosed w/ NICM 04/2018. Has had worsening edema despite titration of diuretics. Presented w/ 2-3 hx of acute increase of BLE edema, Orthopnea and PND. Admitted for AoC HF. With worsening HF, LVAD process started.   Reviewed I/O's: -712 ml x 24 hours and -18.8 L since admission  Spoke with pt, who is in good spirits today. He was walking the hallways and laughing and joking with everyone on the unit today. He reports he has a great appetite; noted meal completion 100%. He shares that he is hungry and requesting steak and baked potatoes; he just received a snack of teddy grahams from nurse secretary prior to RD speaking with pt. He reports he is drinking his Ensure and is ready for lunch ("the doctor told me I can eat whatever I want as long as there's no salt in it"). Encouraged pt on continued good consumption of meals and supplement to promote healing post-op.   Pt confirms LVAD has been scheduled for Monday, 08/03/18; pt is looking forward to procedure.   Labs reviewed: CBGS: 83-161 (inpatient orders for glycemic control are 0-9 units insulin aspart TID with meals).   Diet Order:   Diet Order            Diet Carb Modified Fluid consistency: Thin;  Room service appropriate? Yes  Diet effective now              EDUCATION NEEDS:   Education needs have been addressed  Skin:  Skin Assessment: Reviewed RN Assessment  Last BM:  07/28/18  Height:   Ht Readings from Last 1 Encounters:  07/21/18 5\' 10"  (1.778 m)    Weight:   Wt Readings from Last 1 Encounters:  07/29/18 64.1 kg    Ideal Body Weight:  75.45 kg  BMI:  Body mass index is 20.28 kg/m.  Estimated Nutritional Needs:   Kcal:  2100-2300 kcals (32-35 kcal/kg bw)  Protein:  112-134g (1.7-2g/kg bw)  Fluid:  Per MD fluid goals    Satya Bohall A. Mayford Knife, RD, LDN, CDE Pager: [phone] After hours Pager: [phone]

## 2018-07-29 NOTE — Progress Notes (Signed)
[facility] EDUCATION NOTE:   One of our current patients Theodore Welch) visited with Mr. Headings today to answer questions about living on and caring for someone on [facility] to assist with decision making.    Alyce Pagan RN VAD Coordinator  Office: [phone]  24/7 Pager: [phone]

## 2018-07-29 NOTE — Progress Notes (Signed)
CARDIAC REHAB PHASE I   PRE:  Rate/Rhythm: 100 ST    BP: sitting 99/76    SaO2:   MODE:  Ambulation: 870 ft   POST:  Rate/Rhythm: 104 ST    BP: sitting 102/76     SaO2: 96 RA  Pt had previously walked x3 today. I offered to walk with him outside which he was happy to do. Pt ambulated independently to ED exit and stood for 5 min then returned. He has a slight limp today as his right groin is sore. Return to recliner.  [id number]   Harriet Masson CES, ACSM 07/29/2018 2:24 PM

## 2018-07-29 NOTE — Progress Notes (Signed)
RT offered to place pt on CPAP, pt states he will put it on himself when he is ready for bed. Pt has demonstrated to this RT that he is able to apply mask and turn machine on himself. RT will continue to monitor.

## 2018-07-30 ENCOUNTER — Inpatient Hospital Stay (HOSPITAL_COMMUNITY): Payer: Medicare Other

## 2018-07-30 DIAGNOSIS — I729 Aneurysm of unspecified site: Secondary | ICD-10-CM

## 2018-07-30 DIAGNOSIS — I509 Heart failure, unspecified: Secondary | ICD-10-CM

## 2018-07-30 LAB — CBC
HEMATOCRIT: 41.1 % (ref 39.0–52.0)
HEMOGLOBIN: 12.8 g/dL — AB (ref 13.0–17.0)
MCH: 27.9 pg (ref 26.0–34.0)
MCHC: 31.1 g/dL (ref 30.0–36.0)
MCV: 89.7 fL (ref 78.0–100.0)
Platelets: 190 10*3/uL (ref 150–400)
RBC: 4.58 MIL/uL (ref 4.22–5.81)
RDW: 15.6 % — ABNORMAL HIGH (ref 11.5–15.5)
WBC: 7.5 10*3/uL (ref 4.0–10.5)

## 2018-07-30 LAB — COMPREHENSIVE METABOLIC PANEL
ALK PHOS: 105 U/L (ref 38–126)
ALT: 20 U/L (ref 0–44)
ANION GAP: 5 (ref 5–15)
AST: 26 U/L (ref 15–41)
Albumin: 3.2 g/dL — ABNORMAL LOW (ref 3.5–5.0)
BILIRUBIN TOTAL: 1.5 mg/dL — AB (ref 0.3–1.2)
BUN: 15 mg/dL (ref 6–20)
CALCIUM: 9.5 mg/dL (ref 8.9–10.3)
CO2: 28 mmol/L (ref 22–32)
Chloride: 106 mmol/L (ref 98–111)
Creatinine, Ser: 0.99 mg/dL (ref 0.61–1.24)
GFR calc Af Amer: >60 mL/min (ref >60)
GLUCOSE: 127 mg/dL — AB (ref 70–99)
POTASSIUM: 4.5 mmol/L (ref 3.5–5.1)
Sodium: 139 mmol/L (ref 135–145)
TOTAL PROTEIN: 6.4 g/dL — AB (ref 6.5–8.1)

## 2018-07-30 LAB — COOXEMETRY PANEL
Carboxyhemoglobin: 1.7 % — ABNORMAL HIGH (ref 0.5–1.5)
METHEMOGLOBIN: 1.6 % — AB (ref 0.0–1.5)
O2 Saturation: 71 %
TOTAL HEMOGLOBIN: 12.1 g/dL (ref 12.0–16.0)

## 2018-07-30 LAB — FACTOR 5 LEIDEN

## 2018-07-30 LAB — GLUCOSE, CAPILLARY
GLUCOSE-CAPILLARY: 93 mg/dL (ref 70–99)
GLUCOSE-CAPILLARY: 112 mg/dL — AB (ref 70–99)
GLUCOSE-CAPILLARY: 114 mg/dL — AB (ref 70–99)
Glucose-Capillary: 116 mg/dL — ABNORMAL HIGH (ref 70–99)

## 2018-07-30 LAB — HEPARIN LEVEL (UNFRACTIONATED): Heparin Unfractionated: 1.48 IU/mL — ABNORMAL HIGH (ref 0.30–0.70)

## 2018-07-30 MED ORDER — HEPARIN (PORCINE) IN NACL 100-0.45 UNIT/ML-% IJ SOLN
1300.0000 [IU]/h | INTRAMUSCULAR | Status: DC
  Administered 2018-07-30: 1400 [IU]/h via INTRAVENOUS
  Administered 2018-07-31 – 2018-08-02 (×3): 1300 [IU]/h via INTRAVENOUS
  Filled 2018-07-30 (×4): qty 250

## 2018-07-30 NOTE — Progress Notes (Signed)
Patient complaining of pain in his Right groin.  Assessed area and it was swollen.  Notified MD, new orders for ultrasound.  Held Heparin gtt and held pressure to site per order.  Site has decreased in size and is now softer.  Jaclyn Shaggy Everhart

## 2018-07-30 NOTE — Progress Notes (Signed)
Received page from RN that right groin [name] was positive for hematoma. Discussed with Dr Shirlee Latch. Will hold heparin for 4 hours and have RN compress hematoma. Discussed with RN and adjusted heparin order.  Alford Highland, NP

## 2018-07-30 NOTE — Progress Notes (Addendum)
Patient ID: Theodore Welch, male   DOB: [birth year], 59 y.o.   MRN: [id number]     Advanced Heart Failure Rounding Note  PCP-Cardiologist: No primary care provider on file.   Subjective:    PICC line placed 07/21/18. Initial Co-ox 43%. Started on milrinone 0.25 mcg/kg/min, increased to 0.375 with persistently low co-ox.   Coox 71.0% this am on milrinone 0.375 mcg/kg/min. CVP 4-5   Feeling OK this am. Had some continued soreness in his groin with ambulation yesterday, but improved. Denies lightheadedness or dizziness. No SOB or orthopnea.   Venous US 07/22/18 showed right peroneal vein DVT, now on heparin gtt.   Echo 07/28/18 LVEF 20-25%, Grade 2 DD, mild to mod MR, Mod LAE, Mod RAE, Mod RV dilation, PA peak pressure 51 mm Hg, moderate TR.   PFTs 07/24/18  FVC 3.19 (79%) FEV1 2.47 (78%) DLCO 53% TLC 82%  cMRI 07/23/18 1.  Moderate dilated LV with EF 14%, severe diffuse hypokinesis. 2. Severely dilated RV with moderate to severe systolic dysfunction, EF 25%. 3. There mid-wall LGE at the inferior RV insertion site. This is a nonspecific LGE pattern and can be seen with pressure/volume Overload.  RHC Procedural Findings (9/7): Hemodynamics (on milrinone 0.375 mcg/kg/min) RA mean 8 RV 40/12 PA 40/19, mean 27 PCWP mean 12 CVP/PCWP 0.66 PAPi 2.65 Oxygen saturations: PA 70% AO 95% Cardiac Output (Fick) 5.84  Cardiac Index (Fick) 3.18 PVR 2.6 WU (Fick) CardiacOutput (Thermo) 4.97 Cardiac Index (Themo) 2.71  PVR 3 WU (Thermo)  Objective:    Weight Range: 64.4 kg Body mass index is 20.37 kg/m.   Vital Signs:   Temp:  [97.4 F (36.3 C)-98.4 F (36.9 C)] 98.3 F (36.8 C) (09/12 0618) Resp:  [18-27] 22 (09/12 0618) BP: (90-109)/(66-85) 91/75 (09/12 0618) Weight:  [64.4 kg] 64.4 kg (09/12 0618) Last BM Date: 07/29/18  Weight change: Filed Weights   07/28/18 0453 07/29/18 0340 07/30/18 0618  Weight: 63.4 kg 64.1 kg 64.4 kg   Intake/Output:   Intake/Output Summary (Last  24 hours) at 07/30/2018 0722 Last data filed at 07/30/2018 0450 Gross per 24 hour  Intake 376.96 ml  Output 3425 ml  Net -3048.04 ml    Physical Exam    General: Well appearing. No resp difficulty. HEENT: Normal Neck: Supple. JVP 5-6. Carotids 2+ bilat; no bruits. No thyromegaly or nodule noted. Cor: PMI nondisplaced. RRR, 1/6 HSM apex.  Lungs: CTAB, normal effort. Abdomen: Soft, non-tender, non-distended, no HSM. No bruits or masses. +BS  Extremities: No cyanosis, clubbing, or rash. R and LLE no edema.  Neuro: Alert & orientedx3, cranial nerves grossly intact. moves all 4 extremities w/o difficulty. Affect pleasant   Telemetry   NSR 90s, personally reviewed.   EKG    No new tracings.    Labs    CBC Recent Labs    07/28/18 0456  WBC 6.1  NEUTROABS 2.7  HGB 13.3  HCT 42.3  MCV 87.4  PLT 182   Basic Metabolic Panel Recent Labs    40/98/11 0456 07/29/18 0302  NA 140 138  K 4.2 4.3  CL 103 102  CO2 30 26  GLUCOSE 88 100*  BUN 13 19  CREATININE 0.89 1.06  CALCIUM 9.4 9.3   Liver Function Tests Recent Labs    07/28/18 0456 07/29/18 0302  AST 25 25  ALT 17 19  ALKPHOS 106 97  BILITOT 1.5* 1.6*  PROT 5.9* 5.6*  ALBUMIN 3.1* 3.0*   No results for input(s): LIPASE, AMYLASE  in the last 72 hours. Cardiac Enzymes No results for input(s): CKTOTAL, CKMB, CKMBINDEX, TROPONINI in the last 72 hours.  BNP: BNP (last 3 results) Recent Labs    07/21/18 0540  BNP 1,343.9*    ProBNP (last 3 results) No results for input(s): PROBNP in the last 8760 hours.   D-Dimer No results for input(s): DDIMER in the last 72 hours. Hemoglobin A1C No results for input(s): HGBA1C in the last 72 hours. Fasting Lipid Panel No results for input(s): CHOL, HDL, LDLCALC, TRIG, CHOLHDL, LDLDIRECT in the last 72 hours. Thyroid Function Tests No results for input(s): TSH, T4TOTAL, T3FREE, THYROIDAB in the last 72 hours.  Invalid input(s): FREET3  Other results:   Imaging     No results found.   Medications:     Scheduled Medications: . aspirin EC  81 mg Oral Daily  . atorvastatin  10 mg Oral Daily  . digoxin  0.125 mg Oral Daily  . DULoxetine  60 mg Oral Daily  . feeding supplement (ENSURE ENLIVE)  237 mL Oral BID BM  . gabapentin  600 mg Oral TID  . insulin aspart  0-9 Units Subcutaneous TID WC  . losartan  12.5 mg Oral BID  . pantoprazole  40 mg Oral Daily  . sodium chloride flush  10-40 mL Intracatheter Q12H  . sodium chloride flush  3 mL Intravenous Q12H  . sodium chloride flush  3 mL Intravenous Q12H  . spironolactone  25 mg Oral Daily  . thiamine  100 mg Oral Daily  . torsemide  20 mg Oral Daily    Infusions: . sodium chloride Stopped (07/25/18 2100)  . heparin 1,400 Units/hr (07/29/18 0539)  . milrinone 0.375 mcg/kg/min (07/29/18 0400)    PRN Medications: sodium chloride, acetaminophen **OR** acetaminophen, hydrOXYzine, ipratropium-albuterol, methocarbamol, nicotine, ondansetron (ZOFRAN) IV, senna-docusate, sodium chloride flush, sodium chloride flush    Patient Profile   Theodore Welch is a 57 y.o. 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  Admitted with worsening SOB and orthopnea in setting of severe biventricular CHF.   Assessment/Plan   1. Acute on chronic systolic CHF:  Nonischemic cardiomyopathy based on cath 7/19 in [place]. Remote heavy ETOH, says he has quit drinking completely for about 2 months now. HIV negative.  No cocaine/amphetamines.  Echo was done this admission with biventricular failure, LV EF 10-20% with dilated RV. There is moderate to severe MR and severe TR with poor coaptation of TV leaflets.  On exam, he was markedly volume overloaded initially.  Low output HF indicated by low Co-ox of 43% initially.  cMRI 07/23/18 LVEF EF 14%, Severe RF dysfunction (EF 25%) with nonspecific RV insertion site LGE pattern likely from pressure/volume  overload. RHC showed good cardiac output on milrinone with filling pressures nearing normal.  PAPi 2.65 suggests that RV function is better than expected from imaging.  Echo on 9/10 after diuresis showed EF 20-25%, RV looked better, mild to moderate MR, moderate TR.  - Coox 71% on milrinone 0.375 mcg/kg/min. CVP 4-5 this am.  - Continue torsemide 20 mg daily.  - No beta blocker with low output.  - Continue spironolactone 25 mg daily.  - Continue losartan 12.5 mg BID, no BP room to titrate.  - Continue digoxin 0.125 daily.  - Suspect that he will need for advanced therapies. Think that he is going to be milrinone-dependent.  He has been noted to have cirrhosis on RUQ [name] and CT  and has significant RV dysfunction.  - INR 1.31 07/25/18, so liver synthetic function is not markedly abnormal.  He was smoking marijuana and occasional cigarettes prior to admission, so will not be a transplant candidate until abstinent 6 months. Do not think that he has that long to wait.  - RHC on milrinone and with diuresis on 9/6 looked good, PAPi was 2.65 which seems to indicate adequate RV function. We have initiated LVAD workup, he has been seen by Dr. Laneta Simmers.   - Discussed at V[facility] MRB 07/27/18 and approved. Plan tentatively for LVAD 08/03/18 (Monday) - He had been wearing Lifevest at home with h/o NSVT.   - Cardiac Rehab following. .  2. OSA:  - Continue CPAP qhs.   3. COPD:  He had mostly quit smoking before admission but still smoked a few cigarettes/week.  - PFTs 07/24/18 FVC 3.19 (79%), FEV1 2.47 (78%), DLCO 53%, TLC 82% - No change to current plan.   4. 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 ?component of ETOH cirrhosis given prior heavy ETOH (has now quit). RUQ US showed ascites with evidence for cirrhosis. NH3 with initial elevation at 64 on 9/3. CT abdomen showed resolution of ascites but there was evidence for cirrhosis.  -  INR 1.31 07/25/18, adequate liver synthetic function.  - Sent viral  hepatitis labs => Negative.  - Total bili 1.6 07/29/18. Improved with decongestion (Improved from 3.5 on 07/22/18).  - NH3 down to 23 07/28/18 5. RLE DVT:  He has a right peroneal vein (below knee) DVT.  Even though below knee, think he is a relatively high risk patient with severe cardiomyopathy.  Would favor 6 months anticoagulation.  - Will leave on heparin gtt for now due to possibility of LVAD this admission.  - No change to current plan.   6. Valvular heart disease: Moderate to severe MR and severe TR on initial echo.  Suspect this is functional due to annular dilation.  Repeat echo after diuresis on 9/10 showed mild-moderate MR, moderate TR.   - No change to current plan.    Continue current course of optimization for tentative LVAD 08/03/18.   Graciella Freer, PA-C  07/30/2018 7:22 AM  Advanced Heart Failure Team Pager [phone] (M-F; 7a - 4p)   Patient seen with PA, agree with the above note.  Doing well, good co-ox and CVP 4-5.  Would continue current medical regimen, he is planned for LVAD implantation on Monday.  Will need to discuss with a transplant center.   Marca Ancona 07/30/2018 9:48 AM  Please contact [facility] for night-coverage after hours (4p -7a ) and weekends on [email or web address]

## 2018-07-30 NOTE — Progress Notes (Signed)
  Date: 07/30/2018  Patient name: Theodore Welch  Medical record number: [id number]  Date of birth: 1961/11/12   I have seen and evaluated this patient and I have discussed the plan of care with the house staff. Please see Dr. Crista Elliot and MS4 Steen's note for complete details. I concur with their findings.    Inez Catalina, MD 07/30/2018, 9:07 PM

## 2018-07-30 NOTE — Progress Notes (Addendum)
Subjective:  Theodore Welch sitting on side of bed on exam and reports he is doing well. On milrinone waiting on LVAD placement . Request to have his barber come by this week.  Objective:  Vital signs in last 24 hours: Vitals:   07/29/18 1619 07/29/18 2038 07/29/18 2334 07/30/18 0618  BP: 90/66 109/85 102/81 91/75  Pulse:      Resp: (!) 26 (!) 27 (!) 21 (!) 22  Temp: 98 F (36.7 C) 97.8 F (36.6 C) 98.4 F (36.9 C) 98.3 F (36.8 C)  TempSrc: Oral Oral Oral Oral  SpO2:      Weight:    64.4 kg  Height:       Weight change: 0.3 kg  Intake/Output Summary (Last 24 hours) at 07/30/2018 1610 Last data filed at 07/30/2018 0450 Gross per 24 hour  Intake 376.96 ml  Output 3425 ml  Net -3048.04 ml   Physical Exam Gen: Pleasant, NAD Heart: RRR, no mrg Lungs: CTAB, no w/r/r Ext: no LE edema, clubbing , cyanosis   Assessment/Plan:  Principal Problem:   CHF (congestive heart failure) (HCC) Active Problems:   Tobacco use   Congestive heart failure (HCC)   Type 2 diabetes mellitus (HCC)   Hyperlipidemia   Ascites   Malnutrition of moderate degree   Goals of care, counseling/discussion   Advance care planning   Palliative care encounter  Theodore Welch is a 57yo M with DM2,HTN, HLD, COPD, who presents with worsening of severe biventricular heart failure. Patient presented with PND,orthopnea, and LE edema that has worsened over the past 2-3 weeks. Initial Co-ox 43% and LV EF on TTE was 10-20%. RV was dilated , moderate to severe MR and severe TR. Co-ox improved on milrinone.C-MRI on 9/5 without signs of infiltrative disease. Patient was negative 8.6L on 9/6. RHC on 9/6 showed good cardiac output . TEE obtained on 9/10 showed improved EF to 20-25% , improved RV , and improved AV valve regurgitation. Patient is likely dependent on milrinone and CT surgery consulted. CT surgery plans forLVAD placementon Monday 9/16,  continues to work patient up for this surgery, and to provide  education/ training on LVAD.   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  -Following cardiology-heart failure recommendations - Patient wearing Lifevest at home - Continue digoxin0.125. Milrinone.375, ASA81mg ,losartan12.5mg  BID, spirolactone 25mg , -Continue Torsemide 20 mgas per Heart Failure.  RLE DVT Right peroneal DVT. Plan for 6 months anticoagulation. 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 Likely worsened 2/2 congestive heart failure.He has quit drinking for 4 months now. Hepatitis panel neg,Neg hep B antibody, TBili and NH3 improved with diuresis,INR 1.31 on 9/7. - will need immunization of hep B in future  DM2 CBGs controlled.Hgb A1c 6.5%. Holding home Metformin 500 mg BID.  - HH/Carb mod diet - Sensitive sling-scale insulinbut CBG'scontolled  Anxiety - Continue home Cymbalta (also for chronic low back pain) - Continue home PRN Hydroxyzine 25mg  TID   COPD (not supported on PFT's)  PFT's not consistent with COPD diagnosis. PFT's show moderate diffusion defect and minimal obstructive airway disease. Patient has quit smoking for months. Acceptable study with good patient effort noted and Hgb value 12.7. FEV1/FVC (99% predicted) , FVC (79% pred) , FEV1 (78% pred) , TLC (82% pred) , DLCO (53% pred)  Dispo:  Pending LVAD Diet: HH/Carb mod DVT VQM:[id number] gtt IVF: None   LOS: 9 days   Gardenia Phlegm, Medical Student 07/30/2018, 6:59 AM   Attestation for Student Documentation:  I personally was present and performed or re-performed the history, physical exam and medical decision-making activities of this service and have verified that the service and findings are  accurately documented in the student's note.  Lanelle Bal, MD 07/30/2018, 6:08 PM

## 2018-07-30 NOTE — Progress Notes (Signed)
CARDIAC REHAB PHASE I   PRE:  Rate/Rhythm: 103 ST  BP:  Supine:   Sitting: 101/76  Standing:    SaO2: 100%RA  MODE:  Ambulation: 870 ft   POST:  Rate/Rhythm: 103 ST  BP:  Supine:   Sitting: 109/81  Standing:    SaO2: 95%RA 1350-1415 Pt stated twice to me that he went outside yesterday. Seemed to really enjoy that so I offered to go with pt.. Pt walked independently to ED entrance with me by his side and he stood outside for 5 mins. No DOE and tolerated well. Back to room to recliner. Pt stated he has been walking in hall way independently and using IS.   Luetta Nutting, RN BSN  07/30/2018 2:12 PM

## 2018-07-30 NOTE — Progress Notes (Addendum)
CSW met with pt to check prior to surgery Monday.  Patient continues to be optimistic about surgery but acknowledges anxiety in waiting- relates it to Christmas morning  CSW spoke with pt again regarding finances since his fiance is quitting work to care for him- he states she is 65 and receiving about $1000 in [employer]- he feels as if they will be able to manage since her part time job was only bringing in a little over $100 per pay check (q2weeks)  Pt has no questions or concerns at this time regarding surgery  CSW will continue to follow and assist as needed  Jorge Ny, [place] Social Worker [phone]
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
## 2018-07-30 NOTE — Plan of Care (Signed)
Patient has been walking in hallway and ambulating in room.  Patient tolerating activity well.

## 2018-07-30 NOTE — Progress Notes (Signed)
6 Days Post-Op Procedure(s) (LRB): RIGHT HEART CATH (N/A) Subjective: No shortness of breath. Has been ambulating. Eating well. Only complaint is of some pain in right groin with ambulation. Has a small hematoma there from cath.   Remains on milrinone 0.375. CVP 4-5  Objective: Vital signs in last 24 hours: Temp:  [97.5 F (36.4 C)-98.4 F (36.9 C)] 97.5 F (36.4 C) (09/12 1225) Pulse Rate:  [31-54] 54 (09/12 1225) Cardiac Rhythm: Normal sinus rhythm (09/12 0900) Resp:  [17-27] 21 (09/12 1225) BP: (90-109)/(64-85) 102/79 (09/12 1225) SpO2:  [98 %-100 %] 100 % (09/12 1225) Weight:  [64.4 kg] 64.4 kg (09/12 0618)  Hemodynamic parameters for last 24 hours: CVP:  [6 mmHg-9 mmHg] 6 mmHg  Intake/Output from previous day: 09/11 0701 - 09/12 0700 In: 377 [I.V.:377] Out: 3425 [Urine:3425] Intake/Output this shift: Total I/O In: 10 [I.V.:10] Out: 2750 [Urine:2750]  General appearance: alert and cooperative Neurologic: intact Heart: regular rate and rhythm, S1, S2 normal, no murmur, click, rub or gallop Lungs: clear to auscultation bilaterally Extremities: extremities normal, atraumatic, no cyanosis or edema  Lab Results: Recent Labs    07/28/18 0456 07/30/18 1049  WBC 6.1 7.5  HGB 13.3 12.8*  HCT 42.3 41.1  PLT 182 190   BMET:  Recent Labs    07/29/18 0302 07/30/18 1049  NA 138 139  K 4.3 4.5  CL 102 106  CO2 26 28  GLUCOSE 100* 127*  BUN 19 15  CREATININE 1.06 0.99  CALCIUM 9.3 9.5    PT/INR: No results for input(s): LABPROT, INR in the last 72 hours. ABG    Component Value Date/Time   PHART 7.525 (H) 07/21/2018 1626   HCO3 25.8 07/24/2018 1346   HCO3 25.1 07/24/2018 1346   TCO2 27 07/24/2018 1346   TCO2 26 07/24/2018 1346   O2SAT 71.0 07/30/2018 0609   CBG (last 3)  Recent Labs    07/29/18 1621 07/29/18 2134 07/30/18 0804  GLUCAP 123* 143* 93    Assessment/Plan:  He has been stable on milrinone 0.375. I reviewed his repeat echo from  07/28/2018 and I thought his RV looked better. TR improved to moderate from severe and it is central. MR improved to mild to moderate. Pulmonary valve not seen well but had trivial PI. We will plane to proceed with LVAD on Monday. I discussed the operative procedure with the patient including alternatives, benefits and risks; including but not limited to bleeding, blood transfusion, infection, stroke, myocardial infarction, , heart block requiring a permanent pacemaker, organ dysfunction, pump thrombosis, GI bleeding and death.  Theodore Welch understands and agrees to proceed.   LOS: 9 days    Alleen Borne 07/30/2018

## 2018-07-30 NOTE — Progress Notes (Signed)
Palliative:  I met again today with Mr. Vold and Cinda Quest. He continues to be in good spirits and excited to be preparing for LVAD Monday. They are joking and laughing. They have no questions or concerns.   Discussed Living Will and he wishes to discuss with his family (especially niece who is a Marine scientist) Friday. I did tell them that it will need to notarized to be official and notary may be difficult to obtain on the weekend. Encouraged them to discuss the earlier the better.   Emotional support provided.   20 min  Vinie Sill, NP Palliative Medicine Team Pager # [phone] (M-F 8a-5p) Team Phone # [phone] (Nights/Weekends)

## 2018-07-30 NOTE — Progress Notes (Signed)
RT NOTE:  Pt has CPAP @ bedside and puts on when he is ready. RT available if assistance needed.

## 2018-07-30 NOTE — Progress Notes (Signed)
ANTICOAGULATION CONSULT NOTE  Pharmacy Consult for heparin Indication: DVT  No Known Allergies  Patient Measurements: Height: 5\' 10"  (177.8 cm) Weight: 141 lb 15.6 oz (64.4 kg) IBW/kg (Calculated) : 73 Heparin Dosing Weight: 67.7 kg  Vital Signs: Temp: 98.3 F (36.8 C) (09/12 0618) Temp Source: Oral (09/12 0618) BP: 91/75 (09/12 0618)  Labs: Recent Labs    07/28/18 0338 07/28/18 0456 07/29/18 0302  HGB  --  13.3  --   HCT  --  42.3  --   PLT  --  182  --   HEPARINUNFRC 0.53  --  0.47  CREATININE  --  0.89 1.06    Estimated Creatinine Clearance: 68.3 mL/min (by C-G formula based on SCr of 1.06 mg/dL).   Assessment: 57 y.o. male with RLE DVT for heparin. No anticoagulation PTA.   Patient s/p RHC 9/6, original discussion was to restart apixaban, but plans have changed.   Heparin level elevated at 1.48 on am labs, this was drawn from PICC which heparin is also infusing through. No bleeding or issues noted per RN, patient has been very stable on current heparin rate for a week so will assume the elevated level is due to erroneous lab draw. No rate changes, will follow up with am labs. Hgb stable this am at 12.8.   Tentative plan for LVAD next Monday.  Goal of Therapy:  Heparin level 0.3-0.7 units/ml Monitor platelets by anticoagulation protocol: Yes   Plan:  Continue heparin infusion to 1400 units/hr  Check anti-Xa level daily while on heparin Continue to monitor H&H and platelets  Sheppard Coil PharmD., BCPS Clinical Pharmacist 07/30/2018 8:00 AM

## 2018-07-31 DIAGNOSIS — I081 Rheumatic disorders of both mitral and tricuspid valves: Secondary | ICD-10-CM

## 2018-07-31 DIAGNOSIS — I97638 Postprocedural hematoma of a circulatory system organ or structure following other circulatory system procedure: Secondary | ICD-10-CM

## 2018-07-31 LAB — CBC
HCT: 38.3 % — ABNORMAL LOW (ref 39.0–52.0)
Hemoglobin: 12.3 g/dL — ABNORMAL LOW (ref 13.0–17.0)
MCH: 27.7 pg (ref 26.0–34.0)
MCHC: 32.1 g/dL (ref 30.0–36.0)
MCV: 86.3 fL (ref 78.0–100.0)
PLATELETS: 175 10*3/uL (ref 150–400)
RBC: 4.44 MIL/uL (ref 4.22–5.81)
RDW: 15.2 % (ref 11.5–15.5)
WBC: 8.5 10*3/uL (ref 4.0–10.5)

## 2018-07-31 LAB — HEPARIN LEVEL (UNFRACTIONATED): HEPARIN UNFRACTIONATED: 0.5 [IU]/mL (ref 0.30–0.70)

## 2018-07-31 LAB — COMPREHENSIVE METABOLIC PANEL
ALK PHOS: 97 U/L (ref 38–126)
ALT: 20 U/L (ref 0–44)
AST: 24 U/L (ref 15–41)
Albumin: 3.2 g/dL — ABNORMAL LOW (ref 3.5–5.0)
Anion gap: 8 (ref 5–15)
BUN: 19 mg/dL (ref 6–20)
CALCIUM: 9.7 mg/dL (ref 8.9–10.3)
CO2: 29 mmol/L (ref 22–32)
CREATININE: 1.01 mg/dL (ref 0.61–1.24)
Chloride: 103 mmol/L (ref 98–111)
GFR calc Af Amer: >60 mL/min (ref >60)
Glucose, Bld: 113 mg/dL — ABNORMAL HIGH (ref 70–99)
Potassium: 4.5 mmol/L (ref 3.5–5.1)
Sodium: 140 mmol/L (ref 135–145)
TOTAL PROTEIN: 6.3 g/dL — AB (ref 6.5–8.1)
Total Bilirubin: 1.7 mg/dL — ABNORMAL HIGH (ref 0.3–1.2)

## 2018-07-31 LAB — GLUCOSE, CAPILLARY
GLUCOSE-CAPILLARY: 101 mg/dL — AB (ref 70–99)
GLUCOSE-CAPILLARY: 142 mg/dL — AB (ref 70–99)
GLUCOSE-CAPILLARY: 153 mg/dL — AB (ref 70–99)
Glucose-Capillary: 100 mg/dL — ABNORMAL HIGH (ref 70–99)

## 2018-07-31 LAB — COOXEMETRY PANEL
Carboxyhemoglobin: 1.7 % — ABNORMAL HIGH (ref 0.5–1.5)
METHEMOGLOBIN: 1.4 % (ref 0.0–1.5)
O2 Saturation: 70.7 %
Total hemoglobin: 12.5 g/dL (ref 12.0–16.0)

## 2018-07-31 NOTE — Progress Notes (Signed)
  Date: 07/31/2018  Patient name: Theodore Welch  Medical record number: [id number]  Date of birth: 04-May-1961   I have seen and evaluated this patient and I have discussed the plan of care with the house staff. Please see Dr. Vincente Liberty and MS4 Steen's note for complete details. I concur with their findings.  Inez Catalina, MD 07/31/2018, 9:26 PM

## 2018-07-31 NOTE — Progress Notes (Addendum)
Subjective: Theodore Welch had a right groin [name] yesterday positive for hematoma. Heparin held for four hours and hematoma compressed. Pharmacy restarted heparin last night and aiming to keep level around approximately 3 while.  His hematoma is stable in size and only bothering him when he walks. He will continue to compress the hematoma and reports it will not keep him from being active today. Denies SOB.   Objective:  Vital signs in last 24 hours: Vitals:   07/31/18 0601 07/31/18 0754 07/31/18 1038 07/31/18 1217  BP:  96/71  105/78  Pulse:   98   Resp:  14  (!) 28  Temp:  98.5 F (36.9 C)  97.8 F (36.6 C)  TempSrc:  Oral  Oral  SpO2:      Weight: 63.7 kg     Height:       Weight change: -0.7 kg  Intake/Output Summary (Last 24 hours) at 07/31/2018 1227 Last data filed at 07/31/2018 1218 Gross per 24 hour  Intake 189.17 ml  Output 5875 ml  Net -5685.83 ml   Physical Exam Gen: NAD, pleasant mood.  Heart: RRR, no mrg, no JVD Lungs: CTAB, no w/r/r Groin: hematoma in R. Groin Ext: No LE edema, clubbing or cyanosis  Assessment/Plan:  Principal Problem:   CHF (congestive heart failure) (HCC) Active Problems:   Tobacco use   Congestive heart failure (HCC)   Type 2 diabetes mellitus (HCC)   Hyperlipidemia   Ascites   Malnutrition of moderate degree   Goals of care, counseling/discussion   Advance care planning   Palliative care encounter   Theodore Welch is a 57yo M with DM2,HTN, HLD, COPD, who presents with worsening of severe biventricular heart failure. Patient presented with PND,orthopnea, and LE edema that has worsened over the past 2-3 weeks.Initial Co-ox 43% and LV EF on TTE was 10-20%. RV was dilated , moderate to severe MR and severe TR.Co-ox improved on milrinone.C-MRI on 9/5 without signs of infiltrative disease. Patient was negative 8.6Lon 9/6. RHC on 9/6showed good cardiac output .TEE obtained on 9/10 showed improved EF to 20-25% , improved RV , and  improved AV valve regurgitation. Patient is likely dependent on milrinone and CT surgery consulted.CT surgery plans forLVAD placementon [id number]/45, continues to work patient up for this surgery, and to provide education/ training on LVAD. Developed R.groin hematoma on 9/12. Hematoma viewed on [name] and noted to be at previous heart cath site. Hematoma stable with stopping Heparin for 4 hours.   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  -Following cardiology-heart failure recommendations - Patient wearing Lifevest at home - Continue digoxin0.125. Milrinone.375, ASA81mg ,losartan12.5mg  BID, spirolactone 25mg , -Continue Torsemide 20 mgas per Heart Failure.  RLE DVT Right peroneal DVT. Plan for 6 months anticoagulation. 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 Likely worsened 2/2 congestive heart failure.He has quit drinking for 4 months now. Hepatitis panel neg,Neg hep B antibody,TBili and NH3 improved with diuresis,INR 1.31 on 9/7. - will need immunization of hep B in future  DM2 CBGs controlled.Hgb A1c 6.5%. Holding home Metformin 500 mg BID.  - HH/Carb mod diet - Sensitive sling-scale insulinbut CBG'scontolled  Anxiety - Continue home Cymbalta (also for chronic low back pain) - Continue home PRN Hydroxyzine 25mg  TID   COPD (not supported on PFT's)  PFT's not  consistent with COPD diagnosis. PFT's show moderate diffusion defect and minimal obstructive airway disease. Patient has quit smoking for months. Acceptable study with good patient effort noted and Hgb value 12.7. FEV1/FVC (99% predicted) , FVC (79% pred) , FEV1 (78% pred) , TLC (82% pred) , DLCO (53% pred)  7. R groin  hematoma Hematoma viewed on [name] and noted to be at previous heart cath site. Hematoma stable with stopping Heparin for 4 hours.   Dispo: Pending LVAD Diet: HH/Carb mod DVT [id number] gtt IVF: None   LOS: 10 days   Gardenia Phlegm, Medical Student 07/31/2018, 12:27 PM   Attestation for Student Documentation:  I re-performed the history, physical exam and medical decision-making activities of this service and have verified that the service and findings are accurately documented in the student's note.  Finola Rosal, DO 07/31/2018, 2:00 PM

## 2018-07-31 NOTE — Progress Notes (Addendum)
Patient ID: Theodore Welch, male   DOB: 02-28-1961, 57 y.o.   MRN: [id number]     Advanced Heart Failure Rounding Note  PCP-Cardiologist: No primary care provider on file.   Subjective:    PICC line placed 07/21/18. Initial Co-ox 43%. Started on milrinone 0.25 mcg/kg/min, increased to 0.375 with persistently low co-ox.    Coox 70.7% on milrinone 0.375 mcg/kg/min. CVP stable ~4-5. Cr stable at 1.01  R groin [name] with hematoma. Heparin held x 4 hours and compressed.   Feeling good this am. Hematoma improving, but remains somewhat sore. Denies SOB, lightheadedness, dizziness, or CP walking halls.   Venous US 07/22/18 showed right peroneal vein DVT, now on heparin gtt.   Echo 07/28/18 LVEF 20-25%, Grade 2 DD, mild to mod MR, Mod LAE, Mod RAE, Mod RV dilation, PA peak pressure 51 mm Hg, moderate TR.   PFTs 07/24/18  FVC 3.19 (79%) FEV1 2.47 (78%) DLCO 53% TLC 82%  cMRI 07/23/18 1.  Moderate dilated LV with EF 14%, severe diffuse hypokinesis. 2. Severely dilated RV with moderate to severe systolic dysfunction, EF 25%. 3. There mid-wall LGE at the inferior RV insertion site. This is a nonspecific LGE pattern and can be seen with pressure/volume Overload.  RHC Procedural Findings (9/7): Hemodynamics (on milrinone 0.375 mcg/kg/min) RA mean 8 RV 40/12 PA 40/19, mean 27 PCWP mean 12 CVP/PCWP 0.66 PAPi 2.65 Oxygen saturations: PA 70% AO 95% Cardiac Output (Fick) 5.84  Cardiac Index (Fick) 3.18 PVR 2.6 WU (Fick) CardiacOutput (Thermo) 4.97 Cardiac Index (Themo) 2.71  PVR 3 WU (Thermo)  Objective:    Weight Range: 63.7 kg Body mass index is 20.15 kg/m.   Vital Signs:   Temp:  [97.5 F (36.4 C)-98.4 F (36.9 C)] 98.2 F (36.8 C) (09/13 0305) Pulse Rate:  [31-100] 99 (09/13 0305) Resp:  [14-23] 15 (09/13 0305) BP: (89-107)/(64-82) 92/69 (09/13 0305) SpO2:  [98 %-100 %] 100 % (09/13 0305) Weight:  [63.7 kg] 63.7 kg (09/13 0601) Last BM Date: 07/29/18  Weight change: Filed  Weights   07/29/18 0340 07/30/18 0618 07/31/18 0601  Weight: 64.1 kg 64.4 kg 63.7 kg   Intake/Output:   Intake/Output Summary (Last 24 hours) at 07/31/2018 0727 Last data filed at 07/31/2018 0500 Gross per 24 hour  Intake 179.17 ml  Output 5450 ml  Net -5270.83 ml    Physical Exam    General: No resp difficulty. HEENT: Normal Neck: Supple. JVP 5-6. Carotids 2+ bilat; no bruits. No thyromegaly or nodule noted. Cor: PMI nondisplaced. RRR, 1/6 HSM apex.  Lungs: CTAB, normal effort. Abdomen: Soft, non-tender, non-distended, no HSM. No bruits or masses. +BS  Extremities: No cyanosis, clubbing, or rash. R and LLE no edema.  Neuro: Alert & orientedx3, cranial nerves grossly intact. moves all 4 extremities w/o difficulty. Affect pleasant   Telemetry   NSR/ST 90-100s, personally reviewed.   EKG    No new tracings.    Labs    CBC Recent Labs    07/30/18 1049 07/31/18 0628  WBC 7.5 8.5  HGB 12.8* 12.3*  HCT 41.1 38.3*  MCV 89.7 86.3  PLT 190 175   Basic Metabolic Panel Recent Labs    40/98/11 1049 07/31/18 0628  NA 139 140  K 4.5 4.5  CL 106 103  CO2 28 29  GLUCOSE 127* 113*  BUN 15 19  CREATININE 0.99 1.01  CALCIUM 9.5 9.7   Liver Function Tests Recent Labs    07/30/18 1049 07/31/18 0628  AST 26  24  ALT 20 20  ALKPHOS 105 97  BILITOT 1.5* 1.7*  PROT 6.4* 6.3*  ALBUMIN 3.2* 3.2*   No results for input(s): LIPASE, AMYLASE in the last 72 hours. Cardiac Enzymes No results for input(s): CKTOTAL, CKMB, CKMBINDEX, TROPONINI in the last 72 hours.  BNP: BNP (last 3 results) Recent Labs    07/21/18 0540  BNP 1,343.9*    ProBNP (last 3 results) No results for input(s): PROBNP in the last 8760 hours.   D-Dimer No results for input(s): DDIMER in the last 72 hours. Hemoglobin A1C No results for input(s): HGBA1C in the last 72 hours. Fasting Lipid Panel No results for input(s): CHOL, HDL, LDLCALC, TRIG, CHOLHDL, LDLDIRECT in the last 72  hours. Thyroid Function Tests No results for input(s): TSH, T4TOTAL, T3FREE, THYROIDAB in the last 72 hours.  Invalid input(s): FREET3  Other results:   Imaging    No results found.   Medications:     Scheduled Medications: . aspirin EC  81 mg Oral Daily  . atorvastatin  10 mg Oral Daily  . digoxin  0.125 mg Oral Daily  . DULoxetine  60 mg Oral Daily  . feeding supplement (ENSURE ENLIVE)  237 mL Oral BID BM  . gabapentin  600 mg Oral TID  . insulin aspart  0-9 Units Subcutaneous TID WC  . losartan  12.5 mg Oral BID  . pantoprazole  40 mg Oral Daily  . sodium chloride flush  10-40 mL Intracatheter Q12H  . sodium chloride flush  3 mL Intravenous Q12H  . sodium chloride flush  3 mL Intravenous Q12H  . spironolactone  25 mg Oral Daily  . thiamine  100 mg Oral Daily  . torsemide  20 mg Oral Daily    Infusions: . sodium chloride Stopped (07/25/18 2100)  . heparin 1,400 Units/hr (07/30/18 2250)  . milrinone 0.375 mcg/kg/min (07/30/18 1946)    PRN Medications: sodium chloride, acetaminophen **OR** acetaminophen, hydrOXYzine, ipratropium-albuterol, methocarbamol, nicotine, ondansetron (ZOFRAN) IV, senna-docusate, sodium chloride flush, sodium chloride flush    Patient Profile   Theodore Welch is a 57 y.o. 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  Admitted with worsening SOB and orthopnea in setting of severe biventricular CHF.   Assessment/Plan   1. Acute on chronic systolic CHF:  Nonischemic cardiomyopathy based on cath 7/19 in [place]. Remote heavy ETOH, says he has quit drinking completely for about 2 months now. HIV negative.  No cocaine/amphetamines.  Echo was done this admission with biventricular failure, LV EF 10-20% with dilated RV. There is moderate to severe MR and severe TR with poor coaptation of TV leaflets.  On exam, he was markedly volume overloaded initially.  Low output HF  indicated by low Co-ox of 43% initially.  cMRI 07/23/18 LVEF EF 14%, Severe RF dysfunction (EF 25%) with nonspecific RV insertion site LGE pattern likely from pressure/volume overload. RHC showed good cardiac output on milrinone with filling pressures nearing normal.  PAPi 2.65 suggests that RV function is better than expected from imaging.  Echo on 9/10 after diuresis showed EF 20-25%, RV looked better, mild to moderate MR, moderate TR.  - Coox 70.7% on milrinone 0.375 mcg/kg/min. CVP stable ~4-5 - Continue torsemide 20 mg daily.  - No beta blocker with low output.  - Continue spironolactone 25 mg daily.  - Continue losartan 12.5 mg BID, no BP room to titrate.  - Continue digoxin 0.125 daily.  - Suspect that  he will need for advanced therapies. Think that he is going to be milrinone-dependent.  He has been noted to have cirrhosis on RUQ [name] and CT and has significant RV dysfunction.  - INR 1.31 07/25/18, so liver synthetic function is not markedly abnormal.  He was smoking marijuana and occasional cigarettes prior to admission, so will not be a transplant candidate until abstinent 6 months. Do not think that he has that long to wait.  - RHC on milrinone and with diuresis on 9/6 looked good, PAPi was 2.65 which seems to indicate adequate RV function. We have initiated LVAD workup, he has been seen by Dr. Laneta Simmers.   - Discussed at VAD MRB 07/27/18 and approved. Plan for LVAD 08/03/18 (Monday) - He had been wearing Lifevest at home with h/o NSVT.   - Cardiac Rehab following.  - Will need to be discussed with transplant center for eventual transplant consideration.  2. OSA:  - Continue CPAP qhs.   3. COPD:  He had mostly quit smoking before admission but still smoked a few cigarettes/week.  - PFTs 07/24/18 FVC 3.19 (79%), FEV1 2.47 (78%), DLCO 53%, TLC 82% - No change to current plan.   4. 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 ?component of ETOH cirrhosis given prior heavy ETOH (has now  quit). RUQ US showed ascites with evidence for cirrhosis. NH3 with initial elevation at 64 on 9/3. CT abdomen showed resolution of ascites but there was evidence for cirrhosis.  -  INR 1.31 07/25/18, adequate liver synthetic function.  - Sent viral hepatitis labs => Negative.  - Total bili 1.6 07/29/18. Improved with decongestion (Improved from 3.5 on 07/22/18).  - NH3 down to 23 07/28/18 - No change to current plan.   5. RLE DVT:  He has a right peroneal vein (below knee) DVT.  Even though below knee, think he is a relatively high risk patient with severe cardiomyopathy.  Would favor 6 months anticoagulation.  - Will leave on heparin gtt for now due to possibility of LVAD this admission.  - No change to current plan.   6. Valvular heart disease: - Moderate to severe MR and severe TR on initial echo.  Suspect this is functional due to annular dilation.  Repeat echo after diuresis on 9/10 showed mild-moderate MR, moderate TR.   - No change to current plan.   7. R groin hematoma - Post cath, by [name] 07/30/18 - Improved with compression and holding heparin x 4 hrs.   Continue current course of optimization for tentative LVAD 08/03/18.   Graciella Freer, PA-C  07/31/2018 7:27 AM  Advanced Heart Failure Team Pager [phone] (M-F; 7a - 4p)  Please contact [facility] for night-coverage after hours (4p -7a ) and weekends on [email or web address]  Patient seen with PA, agree with the above note.    He remains stable today, co-ox 71% with CVP 4-5 on milrinone gtt and torsemide 20 mg daily.   Right groin hematoma from cath improved today, soft.   He remains on heparin gtt with below-the-knee DVT on left.    RV function, MR and TR looked better on repeat echo.  Plan for LVAD placement on Monday by Dr. Laneta Simmers. Discussed with Dr Edwena Blow at [facility].   Marca Ancona 07/31/2018 10:46 AM

## 2018-07-31 NOTE — Progress Notes (Signed)
CARDIAC REHAB PHASE I   PRE:  Rate/Rhythm: 101 ST  BP:  Supine: 109/81  Sitting:   Standing:    SaO2: 96%RA  MODE:  Ambulation: 870 ft   POST:  Rate/Rhythm: 104 ST  BP:  Supine: 108/80  Sitting:   Standing:    SaO2: 96%RA 1435-1500 Pt walked to ER entrance and we sat outside for a few minutes. Pt enjoyed being outside. Steady gait and no complaints. Tolerated well.    Luetta Nutting, RN BSN  07/31/2018 2:57 PM

## 2018-07-31 NOTE — Progress Notes (Addendum)
9/1[name] has been discussed with the VAD Medical Review board on 07/27/18. The team feels as if the patient is a good candidate for Destination LVAD therapy. The patient meets criteria for a LVAD implant as listed below:  1)  Has failed to respond to optimal medical management (including beta-blockers and ACE inhibitors if tolerated) for at least 45 of the last 60 days, or have been balloon dependent for 7 days, or IV inotrope dependent for 14 days; and,       *On Inotropes Milrinone started 07/21/18  2)  Has a left ventricular ejection fraction (LVEF) < 25% and, have demonstrated functional limitation with a peak oxygen consumption of <14 ml/kg/min unless balloon pump or inotrope dependent or physically unable to perform the test.         *EF 20-25% By echo (date) 07/28/18            *CPX unable to be completed due to Milrinone  3)  Social work and palliative care evaluations demonstrate appropriate support system in place for discharge to home with a VAD and that end of life discussions have taken place. Both services have expressed no concern regarding patient's candidacy.         *Social work consult (date): 07/27/18        *Palliative Care Consult (date): 07/27/18  4)  Primary caretaker identified that can be taught along with the patient how to manage        the VAD equipment.        *Name: Theodore Welch   5)  Deemed appropriate by our financial coordinator: Theodore Welch        Prior approval: has Medicare A&B. No prior auth required  6)  VAD Coordinator, Theodore Welch has met with patient and caregiver, shown them the VAD equipment and discussed with the patient and caregiver about lifestyle changes necessary for success on mechanical circulatory device.        *Met with Theodore Welch and Theodore Welch on 07/28/18       *Consent for VAD Evaluation/Caregiver Agreement/HIPPA Release/Photo Release signed on 07/24/18  7)  Patient has been discussed with [facility] Dr. Mosetta Pigeon and felt to be an appropriate  candidate.    8)  Intermacs profile: 3  INTERMACS 1: Critical cardiogenic shock describes a patient who is "crashing and burning", in which a patient has life-threatening hypotension and rapidly escalating inotropic pressor support, with critical organ hypoperfusion often confirmed by worsening acidosis and lactate levels.  INTERMACS 2: Progressive decline describes a patient who has been demonstrated "dependent" on inotropic support but nonetheless shows signs of continuing deterioration in nutrition, renal function, fluid retention, or other major status indicator. Patient profile 2 can also describe a patient with refractory volume overload, perhaps with evidence of impaired perfusion, in whom inotropic infusions cannot be maintained due to tachyarrhythmias, clinical ischemia, or other intolerance.  INTERMACS 3: Stable but inotrope dependent describes a patient who is clinically stable on mild-moderate doses of intravenous inotropes (or has a temporary circulatory support device) after repeated documentation of failure to wean without symptomatic hypotension, worsening symptoms, or progressive organ dysfunction (usually renal). It is critical to monitor nutrition, renal function, fluid balance, and overall status carefully in order to distinguish between a  patient who is truly stable at Patient Profile 3 and a patient who has unappreciated decline rendering them Patient Profile 2. This patient may be either at home or in the hospital.   INTERMACS 4: Resting symptoms describes a patient who is  at home on oral therapy but frequently has symptoms of congestion at rest or with activities of daily living (ADL). He or she may have orthopnea, shortness of breath during ADL such as dressing or bathing, gastrointestinal symptoms (abdominal discomfort, nausea, poor appetite), disabling ascites or severe lower extremity edema. This patient should be carefully considered for more intensive management and  surveillance programs, which may in some cases, reveal poor compliance that would compromise outcomes with any therapy.  .  INTERMACS 5: Exertion Intolerant describes a patient who is comfortable at rest but unable to engage in any activity, living predominantly within the house or housebound. This patient has no congestive symptoms, but may have chronically elevated volume status, frequently with renal dysfunction, and may be characterized as exercise intolerant.   INTERMACS 6: Exertion Limited also describes a patient who is comfortable at rest without evidence of fluid overload, but who is able to do some mild activity. Activities of daily living are comfortable and minor activities outside the home such as visiting friends or going to a restaurant can be performed, but fatigue results within a few minutes of any meaningful physical exertion. This patient has occasional episodes of worsening symptoms and is likely to have had a hospitalization for heart failure within the past year.   INTERMACS 7: Advanced NYHA Class 3 describes a patient who is clinically stable with a reasonable level of comfortable activity, despite history of previous decompensation that is not recent. This patient is usually able to walk more than a block. Any decompensation requiring intravenous diuretics or hospitalization within the previous month should make this person a Patient Profile 6 or lower.    9)  NYHA Class: [place] Coordinator  Office: [phone]  24/7 Pager: [phone]

## 2018-07-31 NOTE — Progress Notes (Signed)
ANTICOAGULATION CONSULT NOTE  Pharmacy Consult for heparin Indication: DVT  No Known Allergies  Patient Measurements: Height: 5\' 10"  (177.8 cm) Weight: 140 lb 6.9 oz (63.7 kg) IBW/kg (Calculated) : 73 Heparin Dosing Weight: 67.7 kg  Vital Signs: Temp: 98.2 F (36.8 C) (09/13 0305) Temp Source: Oral (09/13 0305) BP: 92/69 (09/13 0305) Pulse Rate: 99 (09/13 0305)  Labs: Recent Labs    07/29/18 0302 07/30/18 0500 07/30/18 1049 07/31/18 0628  HGB  --   --  12.8*  --   HCT  --   --  41.1  --   PLT  --   --  190  --   HEPARINUNFRC 0.47 1.48*  --  0.50  CREATININE 1.06  --  0.99  --     Estimated Creatinine Clearance: 72.4 mL/min (by C-G formula based on SCr of 0.99 mg/dL).   Assessment: 57 y.o. male with RLE DVT for heparin. No anticoagulation PTA.   Patient s/p RHC 9/6, original discussion was to restart apixaban, but plans have changed.   Heparin level within goal range this morning at 0.5,peripheral stick. Groin hematoma noted yesterday and heparin was paused for some time, then restarted last night. Will aim for low end of heparin range (~0.3). Hgb down slightly to 12.3, pltc stable.   Tentative plan for LVAD next Monday.   Goal of Therapy:  Heparin level 0.3-0.7 units/ml Monitor platelets by anticoagulation protocol: Yes   Plan:  Reduce heparin infusion to 1300 units/hr to keep on lower end of range Check anti-Xa level daily while on heparin Continue to monitor H&H and platelets  Sheppard Coil PharmD., BCPS Clinical Pharmacist 07/31/2018 7:22 AM

## 2018-07-31 NOTE — Progress Notes (Signed)
RT NOTE:  CPAP @ bedside. Pt manages when ready to wear.

## 2018-08-01 DIAGNOSIS — I509 Heart failure, unspecified: Secondary | ICD-10-CM

## 2018-08-01 LAB — CBC
HEMATOCRIT: 38.1 % — AB (ref 39.0–52.0)
Hemoglobin: 12.2 g/dL — ABNORMAL LOW (ref 13.0–17.0)
MCH: 28 pg (ref 26.0–34.0)
MCHC: 32 g/dL (ref 30.0–36.0)
MCV: 87.4 fL (ref 78.0–100.0)
Platelets: 194 10*3/uL (ref 150–400)
RBC: 4.36 MIL/uL (ref 4.22–5.81)
RDW: 15.4 % (ref 11.5–15.5)
WBC: 8.7 10*3/uL (ref 4.0–10.5)

## 2018-08-01 LAB — GLUCOSE, CAPILLARY
GLUCOSE-CAPILLARY: 112 mg/dL — AB (ref 70–99)
Glucose-Capillary: 90 mg/dL (ref 70–99)
Glucose-Capillary: 100 mg/dL — ABNORMAL HIGH (ref 70–99)
Glucose-Capillary: 84 mg/dL (ref 70–99)

## 2018-08-01 LAB — COMPREHENSIVE METABOLIC PANEL WITH GFR
ALT: 22 U/L (ref 0–44)
AST: 26 U/L (ref 15–41)
Albumin: 3.4 g/dL — ABNORMAL LOW (ref 3.5–5.0)
Alkaline Phosphatase: 103 U/L (ref 38–126)
Anion gap: 8 (ref 5–15)
BUN: 24 mg/dL — ABNORMAL HIGH (ref 6–20)
CO2: 30 mmol/L (ref 22–32)
Calcium: 10.2 mg/dL (ref 8.9–10.3)
Chloride: 101 mmol/L (ref 98–111)
Creatinine, Ser: 0.91 mg/dL (ref 0.61–1.24)
GFR calc Af Amer: >60 mL/min
GFR calc non Af Amer: >60 mL/min
Glucose, Bld: 111 mg/dL — ABNORMAL HIGH (ref 70–99)
Potassium: 4.6 mmol/L (ref 3.5–5.1)
Sodium: 139 mmol/L (ref 135–145)
Total Bilirubin: 1.9 mg/dL — ABNORMAL HIGH (ref 0.3–1.2)
Total Protein: 6.5 g/dL (ref 6.5–8.1)

## 2018-08-01 LAB — SURGICAL PCR SCREEN
MRSA, PCR: NEGATIVE
Staphylococcus aureus: NEGATIVE

## 2018-08-01 LAB — COOXEMETRY PANEL
CARBOXYHEMOGLOBIN: 1.8 % — AB (ref 0.5–1.5)
METHEMOGLOBIN: 1.4 % (ref 0.0–1.5)
O2 SAT: 74.8 %
TOTAL HEMOGLOBIN: 12.4 g/dL (ref 12.0–16.0)

## 2018-08-01 LAB — HEPARIN LEVEL (UNFRACTIONATED): Heparin Unfractionated: 0.43 IU/mL (ref 0.30–0.70)

## 2018-08-01 MED ORDER — TORSEMIDE 20 MG PO TABS
20.0000 mg | ORAL_TABLET | Freq: Every day | ORAL | Status: DC
  Administered 2018-08-02: 20 mg via ORAL
  Filled 2018-08-01: qty 1

## 2018-08-01 NOTE — Progress Notes (Signed)
8 Days Post-Op Procedure(s) (LRB): RIGHT HEART CATH (N/A) Subjective: Stable on heparin and milrinone with normal mixed venous saturation Complaining of pain in the right groin from post cath hematoma.  Ultrasound shows no pseudoaneurysm. Labs stable.  CVP normal. Chest x-ray ordered for tomorrow Plan HeartMate 3 LVAD implantation Monday Objective: Vital signs in last 24 hours: Temp:  [97.5 F (36.4 C)-98.3 F (36.8 C)] 97.9 F (36.6 C) (09/14 1217) Pulse Rate:  [98-113] 99 (09/14 1217) Cardiac Rhythm: Sinus tachycardia (09/14 1211) Resp:  [16-26] 18 (09/14 1217) BP: (93-106)/(67-80) 106/80 (09/14 1217) SpO2:  [98 %-100 %] 100 % (09/14 1217) Weight:  [63.1 kg] 63.1 kg (09/14 0555)  Hemodynamic parameters for last 24 hours: CVP:  [7 mmHg-10 mmHg] 10 mmHg  Intake/Output from previous day: 09/13 0701 - 09/14 0700 In: 1476.2 [P.O.:1077; I.V.:399.2] Out: 5625 [Urine:5625] Intake/Output this shift: Total I/O In: 620.7 [P.O.:477; I.V.:143.7] Out: -        Exam    General- alert and comfortable    Neck- no JVD, no cervical adenopathy palpable, no carotid bruit   Lungs- clear without rales, wheezes   Cor- regular rate and rhythm, no murmur , gallop   Abdomen- soft, non-tender   Extremities - warm, non-tender, minimal edema   Neuro- oriented, appropriate, no focal weakness   Lab Results: Recent Labs    07/31/18 0628 08/01/18 0500  WBC 8.5 8.7  HGB 12.3* 12.2*  HCT 38.3* 38.1*  PLT 175 194   BMET:  Recent Labs    07/31/18 0628 08/01/18 0500  NA 140 139  K 4.5 4.6  CL 103 101  CO2 29 30  GLUCOSE 113* 111*  BUN 19 24*  CREATININE 1.01 0.91  CALCIUM 9.7 10.2    PT/INR: No results for input(s): LABPROT, INR in the last 72 hours. ABG    Component Value Date/Time   PHART 7.525 (H) 07/21/2018 1626   HCO3 25.8 07/24/2018 1346   HCO3 25.1 07/24/2018 1346   TCO2 27 07/24/2018 1346   TCO2 26 07/24/2018 1346   O2SAT 74.8 08/01/2018 0605   CBG (last 3)  Recent  Labs    07/31/18 2110 08/01/18 0759 08/01/18 1220  GLUCAP 153* 90 100*    Assessment/Plan: S/P Procedure(s) (LRB): RIGHT HEART CATH (N/A) Mobilize Diuresis Plan HeartMate 3 implantation Monday a.m.  Procedure benefits and risks discussed with patient. Patient understands he is not a candidate for heart transplantation at this time because he is not on a heart transplant waiting list.  He understands the risks of bleeding, blood transfusion, postoperative RV dysfunction, postoperative organ failure postoperative infection, and death.  He agrees to proceed with surgery on September 16  LOS: 11 days    [name] 08/01/2018

## 2018-08-01 NOTE — Progress Notes (Signed)
ANTICOAGULATION CONSULT NOTE  Pharmacy Consult for heparin Indication: DVT  No Known Allergies  Patient Measurements: Height: 5\' 10"  (177.8 cm) Weight: 139 lb 1.8 oz (63.1 kg) IBW/kg (Calculated) : 73 Heparin Dosing Weight: 67.7 kg  Vital Signs: Temp: 97.9 F (36.6 C) (09/14 1217) Temp Source: Oral (09/14 1217) BP: 106/80 (09/14 1217) Pulse Rate: 99 (09/14 1217)  Labs: Recent Labs    07/30/18 0500  07/30/18 1049 07/31/18 0628 08/01/18 0500  HGB  --    < > 12.8* 12.3* 12.2*  HCT  --   --  41.1 38.3* 38.1*  PLT  --   --  190 175 194  HEPARINUNFRC 1.48*  --   --  0.50 0.43  CREATININE  --   --  0.99 1.01 0.91   < > = values in this interval not displayed.    Estimated Creatinine Clearance: 78 mL/min (by C-G formula based on SCr of 0.91 mg/dL).   Assessment: 57 y.o. male with RLE DVT for heparin. No anticoagulation PTA.   Patient s/p RHC 9/6, original discussion was to restart apixaban, but plans have changed.   Heparin level at goal this AM.    Tentative plan for LVAD next Monday.   Goal of Therapy:  Heparin level 0.3-0.7 units/ml Monitor platelets by anticoagulation protocol: Yes   Plan:  Continue IV heparin at current rate. Check anti-Xa level daily while on heparin Continue to monitor H&H and platelets  Jenetta Downer, [facility] Clinical Pharmacist Phone [phone]  08/01/2018 2:03 PM

## 2018-08-01 NOTE — Progress Notes (Signed)
Brief cardiology follow up note  Seen briefly today, states all his questions were answered with Dr. Donata Clay. No concerns at this time. In good spirits, visiting with family today.  General cardiology available (for advanced heart failure) today if any needs arise. We will follow up tomorrow. Pending VAD on 08/03/18.  Jodelle Red, MD, PhD [facility]  [address], [address] [place], [place] [address] [phone]

## 2018-08-01 NOTE — Progress Notes (Signed)
CARDIAC REHAB PHASE I   Pt visiting with numerous family members at this time.  Cardiac Rehab will f/u after surgery. Encouraged walking with nursing staff over the weekend.   Nikki Dom, RN 08/01/2018 3:11 PM

## 2018-08-01 NOTE — Progress Notes (Addendum)
Subjective:  Theodore Welch has had discomfort from R.groin hematoma overnight, but otherwise reports he is doing well. CO-OX this morning 74.8.   Objective:  Vital signs in last 24 hours: Vitals:   08/01/18 0000 08/01/18 0309 08/01/18 0400 08/01/18 0555  BP:  93/74    Pulse:  (!) 106    Resp: 19 (!) 21 (!) 21   Temp:  98.3 F (36.8 C)    TempSrc:  Oral    SpO2:  99%    Weight:    63.1 kg  Height:       Weight change: -0.6 kg  Intake/Output Summary (Last 24 hours) at 08/01/2018 0748 Last data filed at 08/01/2018 0555 Gross per 24 hour  Intake 1476.16 ml  Output 5075 ml  Net -3598.84 ml   Physical Exam Gen: NAD, sitting on side of bed Heart: RRR, no mrg,  Lungs: CTAB, no w/r/r Goin: hematoma in R.groin Ext: No LE edema, clubbing, or cyanosis  Assessment/Plan:  Principal Problem:   CHF (congestive heart failure) (HCC) Active Problems:   Tobacco use   Congestive heart failure (HCC)   Type 2 diabetes mellitus (HCC)   Hyperlipidemia   Ascites   Malnutrition of moderate degree   Goals of care, counseling/discussion   Advance care planning   Palliative care encounter  Theodore Welch is a 57yo M with DM2,HTN, HLD, COPD, who presents with worsening of severe biventricular heart failure. Patient presented with PND,orthopnea, and LE edema that has worsened over the past 2-3 weeks.Initial Co-ox 43% and LV EF on TTE was 10-20%. RV was dilated , moderate to severe MR and severe TR.Co-ox improved on milrinone.C-MRI on 9/5 without signs of infiltrative disease. Patient was negative 8.6Lon 9/6. RHC on 9/6showed good cardiac output .TEE obtained on 9/10 showed improved EF to 20-25% , improved RV , and improved AV valve regurgitation. Patient is likely dependent on milrinone and CT surgery consulted.CT surgery plans forLVAD placementon KCLEXN1/70, continues to work patient up for this surgery, and to provide education/ training on LVAD. Developed R.groin hematoma on 9/12.  Hematoma viewed on [name] and noted to be at previous heart cath site. Hematoma stable with stopping Heparin for 4 hours.   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  -Following cardiology-heart failure recommendations - Patient wearing Lifevest at home - Continue digoxin0.125. Milrinone.375, ASA81mg ,losartan12.5mg  BID, spirolactone 25mg , -Hold Torsemide 20 mg today . Substantial  urine output in last two days , decreased weight, & mild tachycardia. Resume in am.   RLE DVT Right peroneal DVT. Plan for 6 months anticoagulation. 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 Likely worsened 2/2 congestive heart failure.He has quit drinking for 4 months now. Hepatitis panel neg,Neg hep B antibody,TBili and NH3 improved with diuresis,INR 1.31 on 9/7. - will need immunization of hep B in future  DM2 CBGs controlled.Hgb A1c 6.5%. Holding home Metformin 500 mg BID.  - HH/Carb mod diet - Sensitive sling-scale insulinbut CBG'scontolled  Anxiety - Continue home Cymbalta (also for chronic low back pain) - Continue home PRN Hydroxyzine 25mg  TID   COPD (not supported on PFT's)  PFT's not consistent with COPD diagnosis. PFT's show moderate diffusion defect and minimal obstructive airway disease. Patient has quit smoking for months. Acceptable study with good patient effort noted and Hgb value 12.7.  FEV1/FVC (99% predicted) , FVC (79% pred) , FEV1 (78% pred) , TLC (82% pred) , DLCO (53% pred)  7. R groin hematoma Hematoma viewed on [name] and noted to be at previous heart cath site. Hematoma stable with stopping Heparin for 4 hours.  - hematoma stable   Dispo: Pending LVAD Diet: HH/Carb mod DVT  JXB:JYNWGNF gtt IVF: None   LOS: 11 days   Gardenia Phlegm, Medical Student 08/01/2018, 7:48 AM   Attestation for Student Documentation:  I personally was present and performed or re-performed the history, physical exam and medical decision-making activities of this service and have verified that the service and findings are accurately documented in the student's note. Awaiting LVAD placement on 08/03/2018. Holding Torsemide today as above. Hematoma appears stable.    Lanelle Bal, MD 08/01/2018, 9:37 AM

## 2018-08-02 ENCOUNTER — Inpatient Hospital Stay (HOSPITAL_COMMUNITY): Payer: Medicare Other

## 2018-08-02 DIAGNOSIS — I82409 Acute embolism and thrombosis of unspecified deep veins of unspecified lower extremity: Secondary | ICD-10-CM

## 2018-08-02 DIAGNOSIS — E1142 Type 2 diabetes mellitus with diabetic polyneuropathy: Secondary | ICD-10-CM

## 2018-08-02 LAB — BLOOD GAS, ARTERIAL
ACID-BASE EXCESS: 1.6 mmol/L (ref 0.0–2.0)
BICARBONATE: 25.6 mmol/L (ref 20.0–28.0)
DRAWN BY: 511331
FIO2: 21
O2 Saturation: 96.4 %
PCO2 ART: 39.1 mmHg (ref 32.0–48.0)
PH ART: 7.43 (ref 7.350–7.450)
Patient temperature: 98.2
pO2, Arterial: 83.1 mmHg (ref 83.0–108.0)

## 2018-08-02 LAB — COOXEMETRY PANEL
CARBOXYHEMOGLOBIN: 1.9 % — AB (ref 0.5–1.5)
METHEMOGLOBIN: 1.5 % (ref 0.0–1.5)
O2 Saturation: 77.3 %
Total hemoglobin: 11.2 g/dL — ABNORMAL LOW (ref 12.0–16.0)

## 2018-08-02 LAB — CBC
HCT: 37.3 % — ABNORMAL LOW (ref 39.0–52.0)
Hemoglobin: 11.7 g/dL — ABNORMAL LOW (ref 13.0–17.0)
MCH: 27.8 pg (ref 26.0–34.0)
MCHC: 31.4 g/dL (ref 30.0–36.0)
MCV: 88.6 fL (ref 78.0–100.0)
PLATELETS: 208 10*3/uL (ref 150–400)
RBC: 4.21 MIL/uL — AB (ref 4.22–5.81)
RDW: 15.6 % — AB (ref 11.5–15.5)
WBC: 10 10*3/uL (ref 4.0–10.5)

## 2018-08-02 LAB — HEPARIN LEVEL (UNFRACTIONATED): Heparin Unfractionated: 0.33 IU/mL (ref 0.30–0.70)

## 2018-08-02 LAB — GLUCOSE, CAPILLARY
GLUCOSE-CAPILLARY: 93 mg/dL (ref 70–99)
Glucose-Capillary: 77 mg/dL (ref 70–99)
Glucose-Capillary: 75 mg/dL (ref 70–99)
Glucose-Capillary: 156 mg/dL — ABNORMAL HIGH (ref 70–99)

## 2018-08-02 LAB — APTT: aPTT: 53 seconds — ABNORMAL HIGH (ref 24–36)

## 2018-08-02 LAB — BASIC METABOLIC PANEL
ANION GAP: 7 (ref 5–15)
BUN: 24 mg/dL — ABNORMAL HIGH (ref 6–20)
CALCIUM: 9.9 mg/dL (ref 8.9–10.3)
CO2: 27 mmol/L (ref 22–32)
CREATININE: 0.96 mg/dL (ref 0.61–1.24)
Chloride: 104 mmol/L (ref 98–111)
GLUCOSE: 140 mg/dL — AB (ref 70–99)
Potassium: 4.6 mmol/L (ref 3.5–5.1)
Sodium: 138 mmol/L (ref 135–145)

## 2018-08-02 LAB — PREPARE RBC (CROSSMATCH)

## 2018-08-02 LAB — MRSA PCR SCREENING: MRSA by PCR: NEGATIVE

## 2018-08-02 LAB — PROTIME-INR
INR: 1.03
PROTHROMBIN TIME: 13.4 s (ref 11.4–15.2)

## 2018-08-02 MED ORDER — MUPIROCIN 2 % EX OINT
1.0000 "application " | TOPICAL_OINTMENT | Freq: Two times a day (BID) | CUTANEOUS | Status: AC
  Administered 2018-08-02 (×2): 1 via NASAL
  Filled 2018-08-02: qty 22

## 2018-08-02 MED ORDER — CHLORHEXIDINE GLUCONATE 0.12 % MT SOLN
15.0000 mL | Freq: Once | OROMUCOSAL | Status: AC
  Administered 2018-08-03: 15 mL via OROMUCOSAL
  Filled 2018-08-02: qty 15

## 2018-08-02 MED ORDER — BISACODYL 5 MG PO TBEC
5.0000 mg | DELAYED_RELEASE_TABLET | Freq: Once | ORAL | Status: AC
  Administered 2018-08-02: 5 mg via ORAL
  Filled 2018-08-02: qty 1

## 2018-08-02 MED ORDER — SODIUM CHLORIDE 0.9 % IV SOLN
INTRAVENOUS | Status: AC
  Administered 2018-08-03: 1 [IU]/h via INTRAVENOUS
  Filled 2018-08-02: qty 1

## 2018-08-02 MED ORDER — TRANEXAMIC ACID 1000 MG/10ML IV SOLN
1.5000 mg/kg/h | INTRAVENOUS | Status: AC
  Administered 2018-08-03: 1.5 mg/kg/h via INTRAVENOUS
  Filled 2018-08-02: qty 25

## 2018-08-02 MED ORDER — VANCOMYCIN HCL 10 G IV SOLR
1250.0000 mg | INTRAVENOUS | Status: AC
  Administered 2018-08-03: 1250 mg via INTRAVENOUS
  Filled 2018-08-02: qty 1250

## 2018-08-02 MED ORDER — ALPRAZOLAM 0.25 MG PO TABS
0.2500 mg | ORAL_TABLET | ORAL | Status: DC | PRN
  Administered 2018-08-02: 0.5 mg via ORAL
  Filled 2018-08-02: qty 2

## 2018-08-02 MED ORDER — SODIUM CHLORIDE 0.9 % IV SOLN
750.0000 mg | INTRAVENOUS | Status: DC
  Filled 2018-08-02: qty 750

## 2018-08-02 MED ORDER — SODIUM CHLORIDE 0.9 % IV SOLN
0.0000 ug/min | INTRAVENOUS | Status: DC
  Filled 2018-08-02: qty 2

## 2018-08-02 MED ORDER — SODIUM CHLORIDE 0.9 % IV SOLN
600.0000 mg | INTRAVENOUS | Status: AC
  Administered 2018-08-03: 600 mg via INTRAVENOUS
  Filled 2018-08-02: qty 600

## 2018-08-02 MED ORDER — SODIUM CHLORIDE 0.9 % IV SOLN
INTRAVENOUS | Status: DC
  Filled 2018-08-02: qty 30

## 2018-08-02 MED ORDER — CHLORHEXIDINE GLUCONATE 4 % EX LIQD
60.0000 mL | Freq: Once | CUTANEOUS | Status: AC
  Administered 2018-08-03: 4 via TOPICAL
  Filled 2018-08-02: qty 60

## 2018-08-02 MED ORDER — DEXMEDETOMIDINE HCL IN NACL 400 MCG/100ML IV SOLN
0.1000 ug/kg/h | INTRAVENOUS | Status: AC
  Administered 2018-08-03: .5 ug/kg/h via INTRAVENOUS
  Filled 2018-08-02 (×3): qty 100

## 2018-08-02 MED ORDER — FLUCONAZOLE IN SODIUM CHLORIDE 400-0.9 MG/200ML-% IV SOLN
400.0000 mg | INTRAVENOUS | Status: AC
  Administered 2018-08-03: 400 mg via INTRAVENOUS
  Filled 2018-08-02: qty 200

## 2018-08-02 MED ORDER — EPINEPHRINE PF 1 MG/ML IJ SOLN
0.0000 ug/min | INTRAVENOUS | Status: AC
  Administered 2018-08-03: .5 ug/min via INTRAVENOUS
  Filled 2018-08-02: qty 4

## 2018-08-02 MED ORDER — VANCOMYCIN HCL 1 G IV SOLR
1000.0000 mg | INTRAVENOUS | Status: AC
  Administered 2018-08-03: 1000 mg
  Filled 2018-08-02 (×3): qty 1000

## 2018-08-02 MED ORDER — SODIUM CHLORIDE 0.9 % IV SOLN
1.5000 g | INTRAVENOUS | Status: AC
  Administered 2018-08-03: 1.5 g via INTRAVENOUS
  Filled 2018-08-02: qty 1.5

## 2018-08-02 MED ORDER — CHLORHEXIDINE GLUCONATE 4 % EX LIQD
60.0000 mL | Freq: Once | CUTANEOUS | Status: AC
  Administered 2018-08-02: 4 via TOPICAL
  Filled 2018-08-02: qty 60

## 2018-08-02 MED ORDER — MAGNESIUM SULFATE 50 % IJ SOLN
40.0000 meq | INTRAMUSCULAR | Status: DC
  Filled 2018-08-02: qty 9.85

## 2018-08-02 MED ORDER — TEMAZEPAM 15 MG PO CAPS
15.0000 mg | ORAL_CAPSULE | Freq: Once | ORAL | Status: AC | PRN
  Administered 2018-08-02: 15 mg via ORAL
  Filled 2018-08-02: qty 1

## 2018-08-02 MED ORDER — TRANEXAMIC ACID (OHS) BOLUS VIA INFUSION
15.0000 mg/kg | INTRAVENOUS | Status: AC
  Administered 2018-08-03: 954 mg via INTRAVENOUS
  Filled 2018-08-02: qty 954

## 2018-08-02 MED ORDER — TRANEXAMIC ACID (OHS) PUMP PRIME SOLUTION
2.0000 mg/kg | INTRAVENOUS | Status: DC
  Filled 2018-08-02: qty 1.27

## 2018-08-02 MED ORDER — CHLORHEXIDINE GLUCONATE 4 % EX LIQD: 60.0000 mL | Freq: Once | CUTANEOUS | Status: AC

## 2018-08-02 MED ORDER — NITROGLYCERIN IN D5W 200-5 MCG/ML-% IV SOLN
0.0000 ug/min | INTRAVENOUS | Status: DC
  Filled 2018-08-02: qty 250

## 2018-08-02 MED ORDER — DOPAMINE-DEXTROSE 3.2-5 MG/ML-% IV SOLN
0.0000 ug/kg/min | INTRAVENOUS | Status: DC
  Filled 2018-08-02: qty 250

## 2018-08-02 MED ORDER — DOBUTAMINE IN D5W 4-5 MG/ML-% IV SOLN
2.0000 ug/kg/min | INTRAVENOUS | Status: DC
  Filled 2018-08-02: qty 250

## 2018-08-02 MED ORDER — POTASSIUM CHLORIDE 2 MEQ/ML IV SOLN
80.0000 meq | INTRAVENOUS | Status: DC
  Filled 2018-08-02: qty 40

## 2018-08-02 MED ORDER — MILRINONE LACTATE IN DEXTROSE 20-5 MG/100ML-% IV SOLN
0.3000 ug/kg/min | INTRAVENOUS | Status: AC
  Administered 2018-08-03: 0.375 ug/kg/min via INTRAVENOUS
  Filled 2018-08-02: qty 100

## 2018-08-02 MED ORDER — VASOPRESSIN 20 UNIT/ML IV SOLN
0.0400 [IU]/min | INTRAVENOUS | Status: AC
  Administered 2018-08-03: 0.03 [IU]/min via INTRAVENOUS
  Filled 2018-08-02: qty 2

## 2018-08-02 MED ORDER — NOREPINEPHRINE 4 MG/250ML-% IV SOLN
0.0000 ug/min | INTRAVENOUS | Status: AC
  Administered 2018-08-03: 2 ug/min via INTRAVENOUS
  Filled 2018-08-02: qty 250

## 2018-08-02 NOTE — Progress Notes (Signed)
  Date: 08/02/2018  Patient name: Theodore Welch  Medical record number: [id number]  Date of birth: 1961-02-20   I have seen and evaluated this patient and I have discussed the plan of care with the house staff. Please see their note for complete details. I concur with their findings with the following additions/corrections:   57 year old man admitted with biventricular heart failure with LVEF 10 to 20%.  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  He has stabilized on milrinone, daily torsemide, spironolactone, low-dose losartan, and digoxin.  Held torsemide yesterday for mild tachycardia and weight loss, appearing slightly dry.  Resumed torsemide today.  Planning for LVAD tomorrow.  Co-ox stable today at 77%.  Continues to have some pain from the hematoma at his groin catheterization site.  He reports he is going down for imaging of this site today.  He remains on a heparin drip for his DVT.  Jessy Oto, M.D., Ph.D. 08/02/2018, 10:08 AM

## 2018-08-02 NOTE — Progress Notes (Signed)
   Subjective: The patient was ambulating the hallway today prior to our exam. He is to have imaging completed related to his hematoma that resulted from the catheterization. He otherwise denied acute concerns. The patient remains in agreement with completion of the LVAD placement on Monday.   Objective:  Vital signs in last 24 hours: Vitals:   08/01/18 1953 08/01/18 2328 08/02/18 0354 08/02/18 0500  BP: 99/63 92/76 98/60    Pulse: (!) 107 (!) 104 (!) 105   Resp: 18 (!) 37 (!) 22   Temp: 98.7 F (37.1 C) 98.3 F (36.8 C) 98.7 F (37.1 C)   TempSrc: Oral Oral Oral   SpO2: 99% 100% 100%   Weight:    63.6 kg  Height:       Physical Exam: General: A/Ox4, in no acute distress, afebrile Cardio: mildly tachycardic, rhythm regular, no MRG auscultated surprisingly  Pulm: CTAB, no w/r/r Abdo/Genitourinary: area of tenderness with mild ecchymosis at the site of the right femoral RHC and diminished hematoma  Ext: Bilateral lower extremities nontender, nonedematous   Assessment/Plan:  Principal Problem:   CHF (congestive heart failure) (HCC) Active Problems:   Tobacco use   Congestive heart failure (HCC)   Type 2 diabetes mellitus (HCC)   Hyperlipidemia   Ascites   Malnutrition of moderate degree   Goals of care, counseling/discussion   Advance care planning   Palliative care encounter  Theodore Welch is a 57yo M with DM2,HTN, HLD, COPD, who presents with worsening of severe biventricular heart failure. Patient presented with PND,orthopnea, and LE edema that worsened over 2-3 weeks prior to his admission.Initial Co-ox 43% and LV EF on TTE was 10-20% w/ his RV dilated , moderate to severe MR and severe TR.Co-ox improved on milrinone while he awaited further evaluation by the advanced heart failure cardiology team.C-MRI on 9/5 without signs of infiltrative disease was completed. Patient continued to diureses well throughout his stay and remained stable prior to his LVAD placement. Patient  did develop a small right groin hematoma on 9/12 likely secondary to the RHC as confirmed on ultrasound which improved with pressure and a temporary hold of his heparin.  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 Evaluated by CTS on 09/14 w/ plans to proceed with LVAD placement. No apparent contraindications noted. We agree with his primary cardiologists assessment and current treatment.  -Continue torsemide, digoxin, milrinone, ASA, losartan, and spironolactone as per Cardiology -Continue Heparin for VTE treament -Hematoma improving  DMII: No acute issues, CBG's stable ~140-77. Continue SSI sensitive and HH/Carb modified diet. Continue Gabapentin for peripheral neuropathy.   Cirrhosis: NO acute issues. Continue observation. Feel he will benefit most from the LVAD.   Dispo: Anticipated discharge in approximately when cleared by cardiothoracic surgery.   Lanelle Bal, MD 08/02/2018, 6:48 AM Pager: Pager# [phone]

## 2018-08-02 NOTE — Anesthesia Preprocedure Evaluation (Addendum)
Anesthesia Evaluation  Patient identified by MRN, date of birth, ID band Patient awake    Reviewed: Allergy & Precautions, NPO status , Patient's Chart, lab work & pertinent test results  History of Anesthesia Complications Negative for: history of anesthetic complications  Airway Mallampati: II  TM Distance: >3 FB Neck ROM: Full    Dental no notable dental hx. (+) Dental Advisory Given   Pulmonary sleep apnea and Continuous Positive Airway Pressure Ventilation , COPD, Current Smoker,    Pulmonary exam normal        Cardiovascular hypertension, +CHF and + DVT  Normal cardiovascular exam  Study Conclusions  - Left ventricle: The cavity size was moderately dilated. Wall thickness was normal. Systolic function was severely reduced. The estimated ejection fraction was in the range of 20% to 25%. Severe diffuse hypokinesis with no identifiable regional variations. Features are consistent with a pseudonormal left ventricular filling pattern, with concomitant abnormal relaxation and increased filling pressure (grade 2 diastolic dysfunction). No evidence of thrombus. - Ventricular septum: The contour showed diastolic flattening. These changes are consistent with RV volume overload. - Mitral valve: There was mild to moderate regurgitation directed centrally. - Left atrium: The atrium was moderately dilated. - Right ventricle: The cavity size was moderately dilated. - Right atrium: The atrium was moderately dilated. - Tricuspid valve: There was malcoaptation of the valve leaflets. There was moderate regurgitation directed centrally. - Pulmonary arteries: Systolic pressure was moderately increased. PA peak pressure: 51 mm Hg (S).      Neuro/Psych negative neurological ROS  negative psych ROS   GI/Hepatic Neg liver ROS, PUD,   Endo/Other  diabetes  Renal/GU negative Renal ROS     Musculoskeletal negative musculoskeletal  ROS (+)   Abdominal   Peds  Hematology negative hematology ROS (+)   Anesthesia Other Findings Day of surgery medications reviewed with the patient.  Reproductive/Obstetrics                            Anesthesia Physical Anesthesia Plan  ASA: IV  Anesthesia Plan: General   Post-op Pain Management:    Induction: Intravenous  PONV Risk Score and Plan: 2 and Ondansetron and Dexamethasone  Airway Management Planned: Oral ETT  Additional Equipment: Arterial line, PA Cath, 3D TEE and Ultrasound Guidance Line Placement  Intra-op Plan:   Post-operative Plan: Post-operative intubation/ventilation  Informed Consent: I have reviewed the patients History and Physical, chart, labs and discussed the procedure including the risks, benefits and alternatives for the proposed anesthesia with the patient or authorized representative who has indicated his/her understanding and acceptance.   Dental advisory given  Plan Discussed with: Anesthesiologist, CRNA and Surgeon  Anesthesia Plan Comments:        Anesthesia Quick Evaluation

## 2018-08-02 NOTE — Progress Notes (Signed)
9 Days Post-Op Procedure(s) (LRB): RIGHT HEART CATH (N/A) Subjective: Patient feels well but is slightly anxious regarding upcoming surgery Chest x-ray this a.m. shows clear lung fields Preop lab work reviewed and is satisfactory Normal sinus rhythm blood pressure 95/60, CVP is low Plan HeartMate 3 implantation in a.m., orders placed Continue milrinone and heparin on-call to surgery  Objective: Vital signs in last 24 hours: Temp:  [97.8 F (36.6 C)-98.7 F (37.1 C)] 98.1 F (36.7 C) (09/15 1110) Pulse Rate:  [100-107] 103 (09/15 1110) Cardiac Rhythm: Sinus tachycardia (09/15 1200) Resp:  [18-39] 39 (09/15 1200) BP: (90-104)/(60-76) 90/63 (09/15 1110) SpO2:  [99 %-100 %] 100 % (09/15 1200) Weight:  [63.6 kg] 63.6 kg (09/15 0500)  Hemodynamic parameters for last 24 hours: CVP:  [3 mmHg-8 mmHg] 3 mmHg  Intake/Output from previous day: 09/14 0701 - 09/15 0700 In: 1574.7 [P.O.:1074; I.V.:500.7] Out: 2550 [Urine:2550] Intake/Output this shift: Total I/O In: 240 [P.O.:240] Out: 1325 [Urine:1325]  Middle-aged thin male no acute distress Lungs clear Heart rate regular No edema  Lab Results: Recent Labs    08/01/18 0500 08/02/18 0407  WBC 8.7 10.0  HGB 12.2* 11.7*  HCT 38.1* 37.3*  PLT 194 208   BMET:  Recent Labs    08/01/18 0500 08/02/18 0407  NA 139 138  K 4.6 4.6  CL 101 104  CO2 30 27  GLUCOSE 111* 140*  BUN 24* 24*  CREATININE 0.91 0.96  CALCIUM 10.2 9.9    PT/INR: No results for input(s): LABPROT, INR in the last 72 hours. ABG    Component Value Date/Time   PHART 7.525 (H) 07/21/2018 1626   HCO3 25.8 07/24/2018 1346   HCO3 25.1 07/24/2018 1346   TCO2 27 07/24/2018 1346   TCO2 26 07/24/2018 1346   O2SAT 77.3 08/02/2018 0520   CBG (last 3)  Recent Labs    08/01/18 2118 08/02/18 0753 08/02/18 1202  GLUCAP 84 77 93    Assessment/Plan: S/P Procedure(s) (LRB): RIGHT HEART CATH (N/A) Nonischemic cardiomyopathy Cardiogenic shock requiring  milrinone inotropic support HeartMate 3 implantation plan for a.m.  Patient understands the benefits risks and alternatives of surgery.   LOS: 12 days    [name] 08/02/2018

## 2018-08-02 NOTE — Progress Notes (Signed)
Pt states he doesn't want to go on CPAP at the moment but can place it on himself when ready. RT made pt aware if he were to need any help to let us know. RT will continue to monitor as needed.

## 2018-08-02 NOTE — Progress Notes (Signed)
ANTICOAGULATION CONSULT NOTE   Pharmacy Consult for heparin Indication: DVT  No Known Allergies  Patient Measurements: Height: 5\' 10"  (177.8 cm) Weight: 140 lb 4.8 oz (63.6 kg) IBW/kg (Calculated) : 73 Heparin Dosing Weight: 67.7 kg  Vital Signs: Temp: 98.1 F (36.7 C) (09/15 1110) Temp Source: Oral (09/15 1110) BP: 90/63 (09/15 1110) Pulse Rate: 103 (09/15 1110)  Labs: Recent Labs    07/31/18 0628 08/01/18 0500 08/02/18 0407  HGB 12.3* 12.2* 11.7*  HCT 38.3* 38.1* 37.3*  PLT 175 194 208  HEPARINUNFRC 0.50 0.43 0.33  CREATININE 1.01 0.91 0.96    Estimated Creatinine Clearance: 74.5 mL/min (by C-G formula based on SCr of 0.96 mg/dL).   Assessment: 57 y.o. male with RLE DVT for heparin. No anticoagulation PTA. Heparin level at goal this morning.  Awaiting LVAD placement tomorrow.    Goal of Therapy:  Heparin level 0.3-0.7 units/ml Monitor platelets by anticoagulation protocol: Yes   Plan:  Continue IV heparin at current rate. Check anti-Xa level daily while on heparin Continue to monitor H&H and platelets  Jenetta Downer, [facility] Clinical Pharmacist Phone [phone]  08/02/2018 11:38 AM

## 2018-08-02 NOTE — Progress Notes (Signed)
Pt showering with Hibiclens.  Wife at bedside.Karena Addison T

## 2018-08-03 ENCOUNTER — Inpatient Hospital Stay (HOSPITAL_COMMUNITY): Payer: Medicare Other

## 2018-08-03 ENCOUNTER — Encounter (HOSPITAL_COMMUNITY)
Admission: EM | Disposition: A | Discharge status: Home / Self Care | Payer: Self-pay | Source: Home / Self Care | Attending: Internal Medicine

## 2018-08-03 ENCOUNTER — Inpatient Hospital Stay (HOSPITAL_COMMUNITY): Payer: Medicare Other | Admitting: Certified Registered Nurse Anesthetist

## 2018-08-03 DIAGNOSIS — Z95811 Presence of heart assist device: Secondary | ICD-10-CM

## 2018-08-03 HISTORY — PX: TEE WITHOUT CARDIOVERSION: SHX5443

## 2018-08-03 HISTORY — PX: INSERTION OF IMPLANTABLE LEFT VENTRICULAR ASSIST DEVICE: SHX5866

## 2018-08-03 LAB — POCT I-STAT, CHEM 8
BUN: 26 mg/dL — AB (ref 6–20)
BUN: 25 mg/dL — ABNORMAL HIGH (ref 6–20)
BUN: 22 mg/dL — AB (ref 6–20)
BUN: 23 mg/dL — AB (ref 6–20)
BUN: 22 mg/dL — AB (ref 6–20)
BUN: 20 mg/dL (ref 6–20)
BUN: 20 mg/dL (ref 6–20)
CALCIUM ION: 1.29 mmol/L (ref 1.15–1.40)
CALCIUM ION: 1.27 mmol/L (ref 1.15–1.40)
CALCIUM ION: 1.3 mmol/L (ref 1.15–1.40)
CHLORIDE: 100 mmol/L (ref 98–111)
CHLORIDE: 102 mmol/L (ref 98–111)
CHLORIDE: 100 mmol/L (ref 98–111)
CHLORIDE: 101 mmol/L (ref 98–111)
CHLORIDE: 101 mmol/L (ref 98–111)
CHLORIDE: 102 mmol/L (ref 98–111)
CREATININE: 0.6 mg/dL — AB (ref 0.61–1.24)
CREATININE: 0.6 mg/dL — AB (ref 0.61–1.24)
CREATININE: 0.6 mg/dL — AB (ref 0.61–1.24)
Calcium, Ion: 1.29 mmol/L (ref 1.15–1.40)
Calcium, Ion: 1.19 mmol/L (ref 1.15–1.40)
Calcium, Ion: 1.22 mmol/L (ref 1.15–1.40)
Calcium, Ion: 1.13 mmol/L — ABNORMAL LOW (ref 1.15–1.40)
Chloride: 103 mmol/L (ref 98–111)
Creatinine, Ser: 0.6 mg/dL — ABNORMAL LOW (ref 0.61–1.24)
Creatinine, Ser: 0.5 mg/dL — ABNORMAL LOW (ref 0.61–1.24)
Creatinine, Ser: 0.6 mg/dL — ABNORMAL LOW (ref 0.61–1.24)
Creatinine, Ser: 0.7 mg/dL (ref 0.61–1.24)
GLUCOSE: 109 mg/dL — AB (ref 70–99)
GLUCOSE: 111 mg/dL — AB (ref 70–99)
Glucose, Bld: 128 mg/dL — ABNORMAL HIGH (ref 70–99)
Glucose, Bld: 123 mg/dL — ABNORMAL HIGH (ref 70–99)
Glucose, Bld: 136 mg/dL — ABNORMAL HIGH (ref 70–99)
Glucose, Bld: 145 mg/dL — ABNORMAL HIGH (ref 70–99)
Glucose, Bld: 151 mg/dL — ABNORMAL HIGH (ref 70–99)
HCT: 36 % — ABNORMAL LOW (ref 39.0–52.0)
HCT: 30 % — ABNORMAL LOW (ref 39.0–52.0)
HCT: 27 % — ABNORMAL LOW (ref 39.0–52.0)
HCT: 27 % — ABNORMAL LOW (ref 39.0–52.0)
HEMATOCRIT: 34 % — AB (ref 39.0–52.0)
HEMATOCRIT: 30 % — AB (ref 39.0–52.0)
HEMATOCRIT: 26 % — AB (ref 39.0–52.0)
Hemoglobin: 12.2 g/dL — ABNORMAL LOW (ref 13.0–17.0)
Hemoglobin: 11.6 g/dL — ABNORMAL LOW (ref 13.0–17.0)
Hemoglobin: 10.2 g/dL — ABNORMAL LOW (ref 13.0–17.0)
Hemoglobin: 10.2 g/dL — ABNORMAL LOW (ref 13.0–17.0)
Hemoglobin: 8.8 g/dL — ABNORMAL LOW (ref 13.0–17.0)
Hemoglobin: 9.2 g/dL — ABNORMAL LOW (ref 13.0–17.0)
Hemoglobin: 9.2 g/dL — ABNORMAL LOW (ref 13.0–17.0)
POTASSIUM: 4.6 mmol/L (ref 3.5–5.1)
Potassium: 4.7 mmol/L (ref 3.5–5.1)
Potassium: 4.5 mmol/L (ref 3.5–5.1)
Potassium: 3.9 mmol/L (ref 3.5–5.1)
Potassium: 4.4 mmol/L (ref 3.5–5.1)
Potassium: 4.3 mmol/L (ref 3.5–5.1)
Potassium: 4.2 mmol/L (ref 3.5–5.1)
SODIUM: 137 mmol/L (ref 135–145)
SODIUM: 137 mmol/L (ref 135–145)
SODIUM: 140 mmol/L (ref 135–145)
Sodium: 136 mmol/L (ref 135–145)
Sodium: 136 mmol/L (ref 135–145)
Sodium: 138 mmol/L (ref 135–145)
Sodium: 139 mmol/L (ref 135–145)
TCO2: 31 mmol/L (ref 22–32)
TCO2: 30 mmol/L (ref 22–32)
TCO2: 28 mmol/L (ref 22–32)
TCO2: 28 mmol/L (ref 22–32)
TCO2: 30 mmol/L (ref 22–32)
TCO2: 29 mmol/L (ref 22–32)
TCO2: 28 mmol/L (ref 22–32)

## 2018-08-03 LAB — CBC
HCT: 26 % — ABNORMAL LOW (ref 39.0–52.0)
HCT: 27.7 % — ABNORMAL LOW (ref 39.0–52.0)
HEMATOCRIT: 37.4 % — AB (ref 39.0–52.0)
HEMOGLOBIN: 11.9 g/dL — AB (ref 13.0–17.0)
Hemoglobin: 8.4 g/dL — ABNORMAL LOW (ref 13.0–17.0)
Hemoglobin: 8.8 g/dL — ABNORMAL LOW (ref 13.0–17.0)
MCH: 28.1 pg (ref 26.0–34.0)
MCH: 28.6 pg (ref 26.0–34.0)
MCH: 28.4 pg (ref 26.0–34.0)
MCHC: 31.8 g/dL (ref 30.0–36.0)
MCHC: 32.3 g/dL (ref 30.0–36.0)
MCHC: 31.8 g/dL (ref 30.0–36.0)
MCV: 88.2 fL (ref 78.0–100.0)
MCV: 88.4 fL (ref 78.0–100.0)
MCV: 89.4 fL (ref 78.0–100.0)
PLATELETS: 120 10*3/uL — AB (ref 150–400)
PLATELETS: 134 10*3/uL — AB (ref 150–400)
Platelets: 219 10*3/uL (ref 150–400)
RBC: 4.24 MIL/uL (ref 4.22–5.81)
RBC: 2.94 MIL/uL — AB (ref 4.22–5.81)
RBC: 3.1 MIL/uL — AB (ref 4.22–5.81)
RDW: 15.6 % — ABNORMAL HIGH (ref 11.5–15.5)
RDW: 15.4 % (ref 11.5–15.5)
RDW: 15.5 % (ref 11.5–15.5)
WBC: 8.6 10*3/uL (ref 4.0–10.5)
WBC: 9.4 10*3/uL (ref 4.0–10.5)
WBC: 9.8 10*3/uL (ref 4.0–10.5)

## 2018-08-03 LAB — BASIC METABOLIC PANEL
Anion gap: 10 (ref 5–15)
Anion gap: 10 (ref 5–15)
BUN: 27 mg/dL — AB (ref 6–20)
BUN: 19 mg/dL (ref 6–20)
CHLORIDE: 99 mmol/L (ref 98–111)
CHLORIDE: 105 mmol/L (ref 98–111)
CO2: 28 mmol/L (ref 22–32)
CO2: 27 mmol/L (ref 22–32)
CREATININE: 0.78 mg/dL (ref 0.61–1.24)
CREATININE: 0.7 mg/dL (ref 0.61–1.24)
Calcium: 10.1 mg/dL (ref 8.9–10.3)
Calcium: 9 mg/dL (ref 8.9–10.3)
GFR calc Af Amer: >60 mL/min (ref >60)
GFR calc non Af Amer: >60 mL/min (ref >60)
GLUCOSE: 102 mg/dL — AB (ref 70–99)
Glucose, Bld: 109 mg/dL — ABNORMAL HIGH (ref 70–99)
POTASSIUM: 4.3 mmol/L (ref 3.5–5.1)
POTASSIUM: 4.3 mmol/L (ref 3.5–5.1)
SODIUM: 142 mmol/L (ref 135–145)
Sodium: 137 mmol/L (ref 135–145)

## 2018-08-03 LAB — POCT I-STAT 3, ART BLOOD GAS (G3+)
ACID-BASE EXCESS: 4 mmol/L — AB (ref 0.0–2.0)
Acid-Base Excess: 3 mmol/L — ABNORMAL HIGH (ref 0.0–2.0)
Acid-Base Excess: 2 mmol/L (ref 0.0–2.0)
Acid-Base Excess: 3 mmol/L — ABNORMAL HIGH (ref 0.0–2.0)
BICARBONATE: 29.3 mmol/L — AB (ref 20.0–28.0)
Bicarbonate: 27.6 mmol/L (ref 20.0–28.0)
Bicarbonate: 27.4 mmol/L (ref 20.0–28.0)
Bicarbonate: 27.8 mmol/L (ref 20.0–28.0)
O2 SAT: 100 %
O2 SAT: 100 %
O2 SAT: 100 %
O2 Saturation: 100 %
PCO2 ART: 42.9 mmHg (ref 32.0–48.0)
PCO2 ART: 43.9 mmHg (ref 32.0–48.0)
PCO2 ART: 43.4 mmHg (ref 32.0–48.0)
PO2 ART: 229 mmHg — AB (ref 83.0–108.0)
PO2 ART: 390 mmHg — AB (ref 83.0–108.0)
Patient temperature: 36.1
TCO2: 29 mmol/L (ref 22–32)
TCO2: 31 mmol/L (ref 22–32)
TCO2: 29 mmol/L (ref 22–32)
TCO2: 29 mmol/L (ref 22–32)
pCO2 arterial: 45.9 mmHg (ref 32.0–48.0)
pH, Arterial: 7.416 (ref 7.350–7.450)
pH, Arterial: 7.413 (ref 7.350–7.450)
pH, Arterial: 7.4 (ref 7.350–7.450)
pH, Arterial: 7.411 (ref 7.350–7.450)
pO2, Arterial: 166 mmHg — ABNORMAL HIGH (ref 83.0–108.0)
pO2, Arterial: 191 mmHg — ABNORMAL HIGH (ref 83.0–108.0)

## 2018-08-03 LAB — MAGNESIUM
MAGNESIUM: 2.7 mg/dL — AB (ref 1.7–2.4)
Magnesium: 1.7 mg/dL (ref 1.7–2.4)

## 2018-08-03 LAB — DIC (DISSEMINATED INTRAVASCULAR COAGULATION)PANEL
D-Dimer, Quant: 1.62 ug/mL-FEU — ABNORMAL HIGH (ref 0.00–0.50)
Fibrinogen: 352 mg/dL (ref 210–475)
INR: 1.33
Platelets: 120 10*3/uL — ABNORMAL LOW (ref 150–400)
Smear Review: NONE SEEN
aPTT: 35 seconds (ref 24–36)

## 2018-08-03 LAB — GLUCOSE, CAPILLARY
GLUCOSE-CAPILLARY: 91 mg/dL (ref 70–99)
GLUCOSE-CAPILLARY: 127 mg/dL — AB (ref 70–99)
GLUCOSE-CAPILLARY: 137 mg/dL — AB (ref 70–99)
GLUCOSE-CAPILLARY: 151 mg/dL — AB (ref 70–99)
GLUCOSE-CAPILLARY: 139 mg/dL — AB (ref 70–99)
GLUCOSE-CAPILLARY: 140 mg/dL — AB (ref 70–99)
Glucose-Capillary: 113 mg/dL — ABNORMAL HIGH (ref 70–99)
Glucose-Capillary: 118 mg/dL — ABNORMAL HIGH (ref 70–99)
Glucose-Capillary: 140 mg/dL — ABNORMAL HIGH (ref 70–99)
Glucose-Capillary: 167 mg/dL — ABNORMAL HIGH (ref 70–99)

## 2018-08-03 LAB — URINALYSIS, ROUTINE W REFLEX MICROSCOPIC
BILIRUBIN URINE: NEGATIVE
GLUCOSE, UA: NEGATIVE mg/dL
HGB URINE DIPSTICK: NEGATIVE
KETONES UR: NEGATIVE mg/dL
Leukocytes, UA: NEGATIVE
Nitrite: NEGATIVE
PROTEIN: NEGATIVE mg/dL
Specific Gravity, Urine: 1.005 (ref 1.005–1.030)
pH: 5 (ref 5.0–8.0)

## 2018-08-03 LAB — COOXEMETRY PANEL
Carboxyhemoglobin: 2.1 % — ABNORMAL HIGH (ref 0.5–1.5)
Carboxyhemoglobin: 1.6 % — ABNORMAL HIGH (ref 0.5–1.5)
Carboxyhemoglobin: 1.7 % — ABNORMAL HIGH (ref 0.5–1.5)
METHEMOGLOBIN: 2.1 % — AB (ref 0.0–1.5)
Methemoglobin: 0.8 % (ref 0.0–1.5)
Methemoglobin: 2 % — ABNORMAL HIGH (ref 0.0–1.5)
O2 Saturation: 70.2 %
O2 Saturation: 72.4 %
O2 Saturation: 74.7 %
TOTAL HEMOGLOBIN: 8.3 g/dL — AB (ref 12.0–16.0)
TOTAL HEMOGLOBIN: 8.8 g/dL — AB (ref 12.0–16.0)
Total hemoglobin: 12.3 g/dL (ref 12.0–16.0)

## 2018-08-03 LAB — FIBRINOGEN: Fibrinogen: 392 mg/dL (ref 210–475)

## 2018-08-03 LAB — CREATININE, SERUM
CREATININE: 0.73 mg/dL (ref 0.61–1.24)
GFR calc non Af Amer: >60 mL/min (ref >60)

## 2018-08-03 LAB — HEMOGLOBIN AND HEMATOCRIT, BLOOD
HEMATOCRIT: 28.6 % — AB (ref 39.0–52.0)
HEMOGLOBIN: 9.4 g/dL — AB (ref 13.0–17.0)

## 2018-08-03 LAB — HEPARIN LEVEL (UNFRACTIONATED): Heparin Unfractionated: 0.26 IU/mL — ABNORMAL LOW (ref 0.30–0.70)

## 2018-08-03 LAB — PLATELET COUNT: Platelets: 178 10*3/uL (ref 150–400)

## 2018-08-03 LAB — DIC (DISSEMINATED INTRAVASCULAR COAGULATION) PANEL: PROTHROMBIN TIME: 16.4 s — AB (ref 11.4–15.2)

## 2018-08-03 SURGERY — INSERTION OF IMPLANTABLE LEFT VENTRICULAR ASSIST DEVICE
Anesthesia: General | Site: Chest

## 2018-08-03 MED ORDER — POTASSIUM CHLORIDE 10 MEQ/50ML IV SOLN: 10.0000 meq | INTRAVENOUS | Status: AC

## 2018-08-03 MED ORDER — LACTATED RINGERS IV SOLN
INTRAVENOUS | Status: DC | PRN
  Administered 2018-08-03: 07:00:00 via INTRAVENOUS

## 2018-08-03 MED ORDER — DEXAMETHASONE SODIUM PHOSPHATE 10 MG/ML IJ SOLN
INTRAMUSCULAR | Status: AC
  Filled 2018-08-03: qty 1

## 2018-08-03 MED ORDER — EPINEPHRINE PF 1 MG/ML IJ SOLN
0.0000 ug/min | INTRAVENOUS | Status: DC
  Filled 2018-08-03 (×2): qty 4

## 2018-08-03 MED ORDER — SODIUM CHLORIDE 0.9% FLUSH: 3.0000 mL | INTRAVENOUS | Status: DC | PRN

## 2018-08-03 MED ORDER — ACETAMINOPHEN 160 MG/5ML PO SOLN
1000.0000 mg | Freq: Four times a day (QID) | ORAL | Status: AC
  Administered 2018-08-04 (×3): 1000 mg
  Filled 2018-08-03 (×2): qty 40.6

## 2018-08-03 MED ORDER — ROCURONIUM BROMIDE 50 MG/5ML IV SOSY
PREFILLED_SYRINGE | INTRAVENOUS | Status: AC
  Filled 2018-08-03: qty 5

## 2018-08-03 MED ORDER — SODIUM CHLORIDE 0.9 % IV SOLN
INTRAVENOUS | Status: DC | PRN
  Administered 2018-08-03: 750 mg via INTRAVENOUS

## 2018-08-03 MED ORDER — LIDOCAINE 2% (20 MG/ML) 5 ML SYRINGE
INTRAMUSCULAR | Status: AC
  Filled 2018-08-03: qty 5

## 2018-08-03 MED ORDER — RIFAMPIN 300 MG PO CAPS
600.0000 mg | ORAL_CAPSULE | Freq: Once | ORAL | Status: AC
  Administered 2018-08-04: 600 mg via ORAL
  Filled 2018-08-03: qty 2

## 2018-08-03 MED ORDER — PHENYLEPHRINE 40 MCG/ML (10ML) SYRINGE FOR IV PUSH (FOR BLOOD PRESSURE SUPPORT)
PREFILLED_SYRINGE | INTRAVENOUS | Status: AC
  Filled 2018-08-03: qty 10

## 2018-08-03 MED ORDER — MIDAZOLAM HCL 2 MG/2ML IJ SOLN
2.0000 mg | INTRAMUSCULAR | Status: DC | PRN
  Administered 2018-08-04: 1 mg via INTRAVENOUS
  Filled 2018-08-03: qty 2

## 2018-08-03 MED ORDER — HEPARIN SODIUM (PORCINE) 1000 UNIT/ML IJ SOLN
INTRAMUSCULAR | Status: AC
  Filled 2018-08-03: qty 1

## 2018-08-03 MED ORDER — HEMOSTATIC AGENTS (NO CHARGE) OPTIME
TOPICAL | Status: DC | PRN
  Administered 2018-08-03: 1 via TOPICAL

## 2018-08-03 MED ORDER — ACETAMINOPHEN 160 MG/5ML PO SOLN: 650.0000 mg | Freq: Once | ORAL | Status: AC

## 2018-08-03 MED ORDER — BISACODYL 5 MG PO TBEC
10.0000 mg | DELAYED_RELEASE_TABLET | Freq: Every day | ORAL | Status: DC
  Administered 2018-08-04 – 2018-08-11 (×4): 10 mg via ORAL
  Filled 2018-08-03 (×9): qty 2

## 2018-08-03 MED ORDER — FAMOTIDINE IN NACL 20-0.9 MG/50ML-% IV SOLN
20.0000 mg | Freq: Two times a day (BID) | INTRAVENOUS | Status: AC
  Administered 2018-08-03: 20 mg via INTRAVENOUS
  Filled 2018-08-03: qty 50

## 2018-08-03 MED ORDER — PHENYLEPHRINE 40 MCG/ML (10ML) SYRINGE FOR IV PUSH (FOR BLOOD PRESSURE SUPPORT)
PREFILLED_SYRINGE | INTRAVENOUS | Status: DC | PRN
  Administered 2018-08-03: 40 ug via INTRAVENOUS

## 2018-08-03 MED ORDER — CHLORHEXIDINE GLUCONATE 0.12% ORAL RINSE (MEDLINE KIT)
15.0000 mL | Freq: Two times a day (BID) | OROMUCOSAL | Status: DC
  Administered 2018-08-03 – 2018-08-04 (×2): 15 mL via OROMUCOSAL

## 2018-08-03 MED ORDER — ALBUMIN HUMAN 5 % IV SOLN
INTRAVENOUS | Status: DC | PRN
  Administered 2018-08-03 (×2): via INTRAVENOUS

## 2018-08-03 MED ORDER — MORPHINE SULFATE (PF) 2 MG/ML IV SOLN
2.0000 mg | INTRAVENOUS | Status: DC | PRN
  Administered 2018-08-04: 4 mg via INTRAVENOUS
  Administered 2018-08-04: 2 mg via INTRAVENOUS
  Administered 2018-08-04: 4 mg via INTRAVENOUS
  Administered 2018-08-04: 2 mg via INTRAVENOUS
  Administered 2018-08-04 – 2018-08-05 (×4): 4 mg via INTRAVENOUS
  Administered 2018-08-09 – 2018-08-11 (×4): 2 mg via INTRAVENOUS
  Filled 2018-08-03: qty 1
  Filled 2018-08-03: qty 2
  Filled 2018-08-03 (×2): qty 1
  Filled 2018-08-03: qty 2
  Filled 2018-08-03: qty 1
  Filled 2018-08-03 (×4): qty 2
  Filled 2018-08-03 (×2): qty 1
  Filled 2018-08-03: qty 2

## 2018-08-03 MED ORDER — VECURONIUM BROMIDE 10 MG IV SOLR
INTRAVENOUS | Status: AC
  Filled 2018-08-03: qty 10

## 2018-08-03 MED ORDER — DEXMEDETOMIDINE HCL IN NACL 400 MCG/100ML IV SOLN
0.1000 ug/kg/h | INTRAVENOUS | Status: DC
  Administered 2018-08-03 – 2018-08-04 (×2): 0.7 ug/kg/h via INTRAVENOUS
  Filled 2018-08-03 (×2): qty 100

## 2018-08-03 MED ORDER — MIDAZOLAM HCL 10 MG/2ML IJ SOLN
INTRAMUSCULAR | Status: AC
  Filled 2018-08-03: qty 2

## 2018-08-03 MED ORDER — BISACODYL 10 MG RE SUPP
10.0000 mg | Freq: Every day | RECTAL | Status: DC
  Filled 2018-08-03: qty 1

## 2018-08-03 MED ORDER — OXYCODONE HCL 5 MG PO TABS
5.0000 mg | ORAL_TABLET | ORAL | Status: DC | PRN
  Administered 2018-08-04 – 2018-08-05 (×7): 10 mg via ORAL
  Administered 2018-08-07 (×2): 5 mg via ORAL
  Administered 2018-08-08 – 2018-08-09 (×4): 10 mg via ORAL
  Administered 2018-08-09: 5 mg via ORAL
  Administered 2018-08-10 – 2018-08-12 (×9): 10 mg via ORAL
  Filled 2018-08-03 (×7): qty 2
  Filled 2018-08-03: qty 1
  Filled 2018-08-03 (×8): qty 2
  Filled 2018-08-03: qty 1
  Filled 2018-08-03 (×7): qty 2

## 2018-08-03 MED ORDER — METOCLOPRAMIDE HCL 5 MG/ML IJ SOLN
10.0000 mg | Freq: Four times a day (QID) | INTRAMUSCULAR | Status: DC
  Administered 2018-08-03 – 2018-08-06 (×11): 10 mg via INTRAVENOUS
  Filled 2018-08-03 (×8): qty 2

## 2018-08-03 MED ORDER — ARTIFICIAL TEARS OPHTHALMIC OINT
TOPICAL_OINTMENT | OPHTHALMIC | Status: DC | PRN
  Administered 2018-08-03: 1 via OPHTHALMIC

## 2018-08-03 MED ORDER — ASPIRIN 81 MG PO CHEW: 324.0000 mg | CHEWABLE_TABLET | Freq: Every day | ORAL | Status: DC

## 2018-08-03 MED ORDER — VANCOMYCIN HCL 1000 MG IV SOLR
INTRAVENOUS | Status: DC | PRN
  Administered 2018-08-03: 1000 mg

## 2018-08-03 MED ORDER — PROTAMINE SULFATE 10 MG/ML IV SOLN
INTRAVENOUS | Status: DC | PRN
  Administered 2018-08-03: 10 mg via INTRAVENOUS
  Administered 2018-08-03: 180 mg via INTRAVENOUS

## 2018-08-03 MED ORDER — ORAL CARE MOUTH RINSE
15.0000 mL | OROMUCOSAL | Status: DC
  Administered 2018-08-03 – 2018-08-04 (×7): 15 mL via OROMUCOSAL

## 2018-08-03 MED ORDER — THROMBIN 5000 UNITS EX SOLR
OROMUCOSAL | Status: DC | PRN
  Administered 2018-08-03 (×3): 4 mL via TOPICAL

## 2018-08-03 MED ORDER — ASPIRIN 300 MG RE SUPP: 300.0000 mg | Freq: Every day | RECTAL | Status: DC

## 2018-08-03 MED ORDER — PROTAMINE SULFATE 10 MG/ML IV SOLN
INTRAVENOUS | Status: AC
  Filled 2018-08-03: qty 25

## 2018-08-03 MED ORDER — NOREPINEPHRINE 4 MG/250ML-% IV SOLN
0.0000 ug/min | INTRAVENOUS | Status: DC
  Filled 2018-08-03: qty 250

## 2018-08-03 MED ORDER — LACTATED RINGERS IV SOLN
INTRAVENOUS | Status: DC | PRN
  Administered 2018-08-03 (×2): via INTRAVENOUS

## 2018-08-03 MED ORDER — DEXAMETHASONE SODIUM PHOSPHATE 10 MG/ML IJ SOLN
INTRAMUSCULAR | Status: DC | PRN
  Administered 2018-08-03: 10 mg via INTRAVENOUS

## 2018-08-03 MED ORDER — VANCOMYCIN HCL 1000 MG IV SOLR
INTRAVENOUS | Status: AC
  Filled 2018-08-03: qty 1000

## 2018-08-03 MED ORDER — ONDANSETRON HCL 4 MG/2ML IJ SOLN
4.0000 mg | Freq: Four times a day (QID) | INTRAMUSCULAR | Status: DC | PRN
  Administered 2018-08-06: 4 mg via INTRAVENOUS
  Filled 2018-08-03: qty 2

## 2018-08-03 MED ORDER — ROCURONIUM BROMIDE 50 MG/5ML IV SOSY
PREFILLED_SYRINGE | INTRAVENOUS | Status: AC
  Filled 2018-08-03: qty 15

## 2018-08-03 MED ORDER — ONDANSETRON HCL 4 MG/2ML IJ SOLN
INTRAMUSCULAR | Status: AC
  Filled 2018-08-03: qty 2

## 2018-08-03 MED ORDER — SODIUM CHLORIDE 0.45 % IV SOLN: INTRAVENOUS | Status: DC | PRN

## 2018-08-03 MED ORDER — ACETAMINOPHEN 500 MG PO TABS
1000.0000 mg | ORAL_TABLET | Freq: Four times a day (QID) | ORAL | Status: AC
  Administered 2018-08-04 – 2018-08-08 (×16): 1000 mg via ORAL
  Filled 2018-08-03 (×17): qty 2

## 2018-08-03 MED ORDER — PROPOFOL 10 MG/ML IV BOLUS
INTRAVENOUS | Status: AC
  Filled 2018-08-03: qty 20

## 2018-08-03 MED ORDER — MILRINONE LACTATE IN DEXTROSE 20-5 MG/100ML-% IV SOLN: 0.3000 ug/kg/min | INTRAVENOUS | Status: DC

## 2018-08-03 MED ORDER — SODIUM CHLORIDE 0.9 % IV SOLN
1.5000 g | Freq: Two times a day (BID) | INTRAVENOUS | Status: AC
  Administered 2018-08-03 – 2018-08-05 (×4): 1.5 g via INTRAVENOUS
  Filled 2018-08-03 (×4): qty 1.5

## 2018-08-03 MED ORDER — NOREPINEPHRINE BITARTRATE 1 MG/ML IV SOLN
0.0000 ug/min | INTRAVENOUS | Status: DC
  Filled 2018-08-03 (×3): qty 4

## 2018-08-03 MED ORDER — FENTANYL CITRATE (PF) 250 MCG/5ML IJ SOLN
INTRAMUSCULAR | Status: AC
  Filled 2018-08-03: qty 10

## 2018-08-03 MED ORDER — ALBUMIN HUMAN 5 % IV SOLN
250.0000 mL | INTRAVENOUS | Status: AC | PRN
  Administered 2018-08-03 (×3): 12.5 g via INTRAVENOUS
  Filled 2018-08-03 (×2): qty 250

## 2018-08-03 MED ORDER — ETOMIDATE 2 MG/ML IV SOLN
INTRAVENOUS | Status: DC | PRN
  Administered 2018-08-03: 14 mg via INTRAVENOUS

## 2018-08-03 MED ORDER — FENTANYL CITRATE (PF) 250 MCG/5ML IJ SOLN
INTRAMUSCULAR | Status: DC | PRN
  Administered 2018-08-03 (×2): 50 ug via INTRAVENOUS
  Administered 2018-08-03: 100 ug via INTRAVENOUS
  Administered 2018-08-03 (×2): 50 ug via INTRAVENOUS
  Administered 2018-08-03: 150 ug via INTRAVENOUS
  Administered 2018-08-03: 50 ug via INTRAVENOUS
  Administered 2018-08-03: 100 ug via INTRAVENOUS
  Administered 2018-08-03 (×2): 50 ug via INTRAVENOUS
  Administered 2018-08-03: 100 ug via INTRAVENOUS
  Administered 2018-08-03 (×3): 50 ug via INTRAVENOUS

## 2018-08-03 MED ORDER — ONDANSETRON HCL 4 MG/2ML IJ SOLN
INTRAMUSCULAR | Status: DC | PRN
  Administered 2018-08-03: 4 mg via INTRAVENOUS

## 2018-08-03 MED ORDER — THROMBIN 5000 UNITS EX SOLR
CUTANEOUS | Status: AC
  Filled 2018-08-03: qty 20000

## 2018-08-03 MED ORDER — MAGNESIUM SULFATE 4 GM/100ML IV SOLN
4.0000 g | Freq: Once | INTRAVENOUS | Status: AC
  Administered 2018-08-03: 4 g via INTRAVENOUS
  Filled 2018-08-03: qty 100

## 2018-08-03 MED ORDER — LACTATED RINGERS IV SOLN
INTRAVENOUS | Status: DC
  Administered 2018-08-05: 12:00:00 via INTRAVENOUS

## 2018-08-03 MED ORDER — LACTATED RINGERS IV SOLN
INTRAVENOUS | Status: DC
  Administered 2018-08-04: 06:00:00 via INTRAVENOUS

## 2018-08-03 MED ORDER — MIDAZOLAM HCL 5 MG/5ML IJ SOLN
INTRAMUSCULAR | Status: DC | PRN
  Administered 2018-08-03: 1 mg via INTRAVENOUS
  Administered 2018-08-03: 2 mg via INTRAVENOUS
  Administered 2018-08-03: 3 mg via INTRAVENOUS
  Administered 2018-08-03 (×2): 2 mg via INTRAVENOUS

## 2018-08-03 MED ORDER — ASPIRIN EC 325 MG PO TBEC
325.0000 mg | DELAYED_RELEASE_TABLET | Freq: Every day | ORAL | Status: DC
  Administered 2018-08-04 – 2018-08-07 (×4): 325 mg via ORAL
  Filled 2018-08-03 (×4): qty 1

## 2018-08-03 MED ORDER — INSULIN REGULAR BOLUS VIA INFUSION
0.0000 [IU] | Freq: Three times a day (TID) | INTRAVENOUS | Status: DC
  Filled 2018-08-03: qty 10

## 2018-08-03 MED ORDER — NOREPINEPHRINE 4 MG/250ML-% IV SOLN
0.0000 ug/min | INTRAVENOUS | Status: AC
  Filled 2018-08-03: qty 250

## 2018-08-03 MED ORDER — LACTATED RINGERS IV SOLN
500.0000 mL | Freq: Once | INTRAVENOUS | Status: AC | PRN
  Administered 2018-08-03: 500 mL via INTRAVENOUS

## 2018-08-03 MED ORDER — 0.9 % SODIUM CHLORIDE (POUR BTL) OPTIME
TOPICAL | Status: DC | PRN
  Administered 2018-08-03: 4000 mL

## 2018-08-03 MED ORDER — ROCURONIUM BROMIDE 10 MG/ML (PF) SYRINGE
PREFILLED_SYRINGE | INTRAVENOUS | Status: DC | PRN
  Administered 2018-08-03: 30 mg via INTRAVENOUS
  Administered 2018-08-03 (×2): 50 mg via INTRAVENOUS
  Administered 2018-08-03: 70 mg via INTRAVENOUS

## 2018-08-03 MED ORDER — HEPARIN SODIUM (PORCINE) 1000 UNIT/ML IJ SOLN
INTRAMUSCULAR | Status: DC | PRN
  Administered 2018-08-03: 19000 [IU] via INTRAVENOUS

## 2018-08-03 MED ORDER — ARTIFICIAL TEARS OPHTHALMIC OINT
TOPICAL_OINTMENT | OPHTHALMIC | Status: AC
  Filled 2018-08-03: qty 3.5

## 2018-08-03 MED ORDER — SODIUM CHLORIDE 0.9 % IV SOLN
INTRAVENOUS | Status: DC
  Filled 2018-08-03 (×3): qty 1

## 2018-08-03 MED ORDER — TRAMADOL HCL 50 MG PO TABS: 50.0000 mg | ORAL_TABLET | ORAL | Status: DC | PRN

## 2018-08-03 MED ORDER — MORPHINE SULFATE (PF) 2 MG/ML IV SOLN
1.0000 mg | INTRAVENOUS | Status: AC | PRN
  Administered 2018-08-03: 4 mg via INTRAVENOUS
  Administered 2018-08-03: 2 mg via INTRAVENOUS
  Filled 2018-08-03: qty 1
  Filled 2018-08-03: qty 2

## 2018-08-03 MED ORDER — SODIUM CHLORIDE 0.9% FLUSH
3.0000 mL | Freq: Two times a day (BID) | INTRAVENOUS | Status: DC
  Administered 2018-08-04 – 2018-08-08 (×9): 3 mL via INTRAVENOUS

## 2018-08-03 MED ORDER — FENTANYL CITRATE (PF) 250 MCG/5ML IJ SOLN
INTRAMUSCULAR | Status: AC
  Filled 2018-08-03: qty 5

## 2018-08-03 MED ORDER — VASOPRESSIN 20 UNIT/ML IV SOLN
0.0400 [IU]/min | INTRAVENOUS | Status: AC
  Filled 2018-08-03: qty 2

## 2018-08-03 MED ORDER — VECURONIUM BROMIDE 10 MG IV SOLR
INTRAVENOUS | Status: DC | PRN
  Administered 2018-08-03: 10 mg via INTRAVENOUS

## 2018-08-03 MED ORDER — CHLORHEXIDINE GLUCONATE 0.12 % MT SOLN
15.0000 mL | OROMUCOSAL | Status: AC
  Administered 2018-08-03: 15 mL via OROMUCOSAL

## 2018-08-03 MED ORDER — VANCOMYCIN HCL IN DEXTROSE 1-5 GM/200ML-% IV SOLN
1000.0000 mg | Freq: Two times a day (BID) | INTRAVENOUS | Status: AC
  Administered 2018-08-03 – 2018-08-04 (×3): 1000 mg via INTRAVENOUS
  Filled 2018-08-03 (×3): qty 200

## 2018-08-03 MED ORDER — FLUCONAZOLE IN SODIUM CHLORIDE 400-0.9 MG/200ML-% IV SOLN
400.0000 mg | Freq: Once | INTRAVENOUS | Status: AC
  Administered 2018-08-04: 400 mg via INTRAVENOUS
  Filled 2018-08-03: qty 200

## 2018-08-03 MED ORDER — SODIUM CHLORIDE 0.9 % IV SOLN: 250.0000 mL | INTRAVENOUS | Status: DC

## 2018-08-03 MED ORDER — DOCUSATE SODIUM 100 MG PO CAPS
200.0000 mg | ORAL_CAPSULE | Freq: Every day | ORAL | Status: DC
  Administered 2018-08-04 – 2018-08-11 (×8): 200 mg via ORAL
  Filled 2018-08-03 (×10): qty 2

## 2018-08-03 MED ORDER — EPINEPHRINE PF 1 MG/ML IJ SOLN
0.0000 ug/min | INTRAVENOUS | Status: DC
  Filled 2018-08-03: qty 4

## 2018-08-03 MED ORDER — ACETAMINOPHEN 650 MG RE SUPP
650.0000 mg | Freq: Once | RECTAL | Status: AC
  Administered 2018-08-03: 650 mg via RECTAL

## 2018-08-03 MED ORDER — MILRINONE LACTATE IN DEXTROSE 20-5 MG/100ML-% IV SOLN
0.3750 ug/kg/min | INTRAVENOUS | Status: DC
  Administered 2018-08-03 – 2018-08-04 (×3): 0.375 ug/kg/min via INTRAVENOUS
  Filled 2018-08-03 (×3): qty 100

## 2018-08-03 MED ORDER — THROMBIN 5000 UNITS EX SOLR
CUTANEOUS | Status: DC | PRN
  Administered 2018-08-03 (×3): 5000 [IU] via TOPICAL

## 2018-08-03 MED ORDER — PANTOPRAZOLE SODIUM 40 MG PO TBEC
40.0000 mg | DELAYED_RELEASE_TABLET | Freq: Every day | ORAL | Status: DC
  Administered 2018-08-05 – 2018-08-12 (×8): 40 mg via ORAL
  Filled 2018-08-03 (×9): qty 1

## 2018-08-03 MED ORDER — SODIUM CHLORIDE 0.9 % IV SOLN
INTRAVENOUS | Status: DC
  Administered 2018-08-03 – 2018-08-04 (×4): via INTRAVENOUS

## 2018-08-03 MED ORDER — LIDOCAINE 2% (20 MG/ML) 5 ML SYRINGE
INTRAMUSCULAR | Status: DC | PRN
  Administered 2018-08-03: 100 mg via INTRAVENOUS

## 2018-08-03 SURGICAL SUPPLY — 140 items
ADAPTER CARDIO PERF ANTE/RETRO (ADAPTER) ×3 IMPLANT
APPLICATOR CHLORAPREP 3ML ORNG (MISCELLANEOUS) ×3 IMPLANT
ATTRACTOMAT 16X20 MAGNETIC DRP (DRAPES) ×3 IMPLANT
BAG DECANTER FOR FLEXI CONT (MISCELLANEOUS) ×6 IMPLANT
BLADE STERNUM SYSTEM 6 (BLADE) ×6 IMPLANT
BLADE SURG 10 STRL SS (BLADE) IMPLANT
BLADE SURG 12 STRL SS (BLADE) ×3 IMPLANT
BLADE SURG 15 STRL LF DISP TIS (BLADE) ×2 IMPLANT
BLADE SURG 15 STRL SS (BLADE) ×1
BLADE SURG ROTATE 9660 (MISCELLANEOUS) ×3 IMPLANT
CANISTER SUCT 3000ML PPV (MISCELLANEOUS) ×6 IMPLANT
CANN PRFSN 3/8X14X24FR PCFC (MISCELLANEOUS)
CANN PRFSN 3/8XCNCT ST RT ANG (MISCELLANEOUS)
CANNULA AORTIC ROOT 9FR (CANNULA) ×3 IMPLANT
CANNULA ARTERIAL NVNT 3/8 20FR (MISCELLANEOUS) ×3 IMPLANT
CANNULA ARTERIAL VENT 3/8 20FR (CANNULA) ×3 IMPLANT
CANNULA GUNDRY RCSP 15FR (MISCELLANEOUS) ×3 IMPLANT
CANNULA MC2 2 STG 36/46 NON-V (CANNULA) ×2 IMPLANT
CANNULA PRFSN 3/8X14X24FR PCFC (MISCELLANEOUS) IMPLANT
CANNULA PRFSN 3/8XCNCT RT ANG (MISCELLANEOUS) IMPLANT
CANNULA SUMP PERICARDIAL (CANNULA) ×3 IMPLANT
CANNULA VEN MTL TIP RT (MISCELLANEOUS)
CANNULA VENOUS 2 STG 34/46 (CANNULA) ×1
CANNULA VENOUS LOW PROF 34X46 (CANNULA) ×3 IMPLANT
CATH FOLEY 2WAY SLVR  5CC 14FR (CATHETERS) ×1
CATH FOLEY 2WAY SLVR 5CC 14FR (CATHETERS) ×2 IMPLANT
CATH HEART VENT LEFT (CATHETERS) IMPLANT
CATH HYDRAGLIDE XL THORACIC (CATHETERS) ×3 IMPLANT
CATH ROBINSON RED A/P 18FR (CATHETERS) ×15 IMPLANT
CATH THORACIC 28FR (CATHETERS) IMPLANT
CATH THORACIC 36FR (CATHETERS) ×3 IMPLANT
CATH THORACIC 36FR RT ANG (CATHETERS) ×3 IMPLANT
CHLORAPREP W/TINT 26ML (MISCELLANEOUS) IMPLANT
CONN 1/2X1/2X1/2  BEN (MISCELLANEOUS) ×1
CONN 1/2X1/2X1/2 BEN (MISCELLANEOUS) ×2 IMPLANT
CONN 3/8X1/2 ST GISH (MISCELLANEOUS) ×6 IMPLANT
CONN ST 1/4X3/8  BEN (MISCELLANEOUS)
CONN ST 1/4X3/8 BEN (MISCELLANEOUS) IMPLANT
CONT SPEC 4OZ CLIKSEAL STRL BL (MISCELLANEOUS) ×3 IMPLANT
COVER BACK TABLE 60X90IN (DRAPES) ×3 IMPLANT
COVER SURGICAL LIGHT HANDLE (MISCELLANEOUS) ×6 IMPLANT
CRADLE DONUT ADULT HEAD (MISCELLANEOUS) ×6 IMPLANT
DRAIN CHANNEL 28F RND 3/8 FF (WOUND CARE) IMPLANT
DRAIN CHANNEL 32F RND 10.7 FF (WOUND CARE) IMPLANT
DRAPE BILATERAL SPLIT (DRAPES) ×3 IMPLANT
DRAPE INCISE IOBAN 66X45 STRL (DRAPES) ×6 IMPLANT
DRAPE SLUSH/WARMER DISC (DRAPES) ×6 IMPLANT
DRSG COVADERM 4X14 (GAUZE/BANDAGES/DRESSINGS) ×3 IMPLANT
ELECT BLADE 4.0 EZ CLEAN MEGAD (MISCELLANEOUS) ×3
ELECT BLADE 6.5 EXT (BLADE) ×3 IMPLANT
ELECT CAUTERY BLADE 6.4 (BLADE) ×3 IMPLANT
ELECT PAD GROUND ADT 9 (MISCELLANEOUS) ×6 IMPLANT
ELECT REM PT RETURN 9FT ADLT (ELECTROSURGICAL) ×9
ELECTRODE BLDE 4.0 EZ CLN MEGD (MISCELLANEOUS) ×2 IMPLANT
ELECTRODE REM PT RTRN 9FT ADLT (ELECTROSURGICAL) ×6 IMPLANT
FELT TEFLON 1X6 (MISCELLANEOUS) ×6 IMPLANT
FELT TEFLON 6X6 (MISCELLANEOUS) ×3 IMPLANT
GAUZE SPONGE 4X4 12PLY STRL (GAUZE/BANDAGES/DRESSINGS) ×3 IMPLANT
GLOVE BIO SURGEON STRL SZ 6 (GLOVE) ×12 IMPLANT
GLOVE BIO SURGEON STRL SZ 6.5 (GLOVE) ×6 IMPLANT
GLOVE BIO SURGEON STRL SZ7 (GLOVE) IMPLANT
GLOVE BIO SURGEON STRL SZ7.5 (GLOVE) ×3 IMPLANT
GLOVE BIOGEL M 6.5 STRL (GLOVE) ×6 IMPLANT
GLOVE BIOGEL M STRL SZ7.5 (GLOVE) ×6 IMPLANT
GLOVE BIOGEL PI IND STRL 6 (GLOVE) ×6 IMPLANT
GLOVE BIOGEL PI IND STRL 7.0 (GLOVE) ×6 IMPLANT
GLOVE BIOGEL PI INDICATOR 6 (GLOVE) ×3
GLOVE BIOGEL PI INDICATOR 7.0 (GLOVE) ×3
GLOVE EUDERMIC 7 POWDERFREE (GLOVE) ×12 IMPLANT
GOWN STRL REUS W/ TWL LRG LVL3 (GOWN DISPOSABLE) ×22 IMPLANT
GOWN STRL REUS W/ TWL XL LVL3 (GOWN DISPOSABLE) ×4 IMPLANT
GOWN STRL REUS W/TWL LRG LVL3 (GOWN DISPOSABLE) ×11
GOWN STRL REUS W/TWL XL LVL3 (GOWN DISPOSABLE) ×2
HEMOSTAT POWDER SURGIFOAM 1G (HEMOSTASIS) ×18 IMPLANT
HEMOSTAT SURGICEL 2X14 (HEMOSTASIS) ×6 IMPLANT
INSERT FOGARTY XLG (MISCELLANEOUS) IMPLANT
IV SOD CHL 0.9% 1000ML (IV SOLUTION) ×3 IMPLANT
KIT BASIN OR (CUSTOM PROCEDURE TRAY) ×6 IMPLANT
KIT CATH CPB BARTLE (MISCELLANEOUS) ×3 IMPLANT
KIT LVAD HEARTMATE 3 W-CNTRL (Prosthesis & Implant Heart) ×3 IMPLANT
KIT SUCTION CATH 14FR (SUCTIONS) ×6 IMPLANT
KIT TURNOVER KIT B (KITS) ×6 IMPLANT
LEAD PACING MYOCARDI (MISCELLANEOUS) ×3 IMPLANT
LINE VENT (MISCELLANEOUS) ×3 IMPLANT
NS IRRIG 1000ML POUR BTL (IV SOLUTION) ×30 IMPLANT
PACK E OPEN HEART (SUTURE) ×3 IMPLANT
PACK OPEN HEART (CUSTOM PROCEDURE TRAY) ×6 IMPLANT
PAD ARMBOARD 7.5X6 YLW CONV (MISCELLANEOUS) ×12 IMPLANT
PAD DEFIB R2 (MISCELLANEOUS) ×3 IMPLANT
PUNCH AORTIC ROTATE  4.5MM 8IN (MISCELLANEOUS) ×3 IMPLANT
PUNCH AORTIC ROTATE 4.5MM 8IN (MISCELLANEOUS) ×3 IMPLANT
SEALANT SURG COSEAL 8ML (VASCULAR PRODUCTS) ×3 IMPLANT
SET CARDIOPLEGIA MPS 5001102 (MISCELLANEOUS) ×3 IMPLANT
SHEATH AVANTI 11CM 5FR (SHEATH) IMPLANT
SPONGE LAP 18X18 X RAY DECT (DISPOSABLE) ×3 IMPLANT
SUCKER INTRACARDIAC WEIGHTED (SUCKER) ×3 IMPLANT
SUCKER WEIGHTED FLEX (MISCELLANEOUS) ×3 IMPLANT
SUT BONE WAX W31G (SUTURE) ×3 IMPLANT
SUT ETHIBOND 2 0 SH (SUTURE) ×5 IMPLANT
SUT ETHIBOND 2 0 SH 36X2 (SUTURE) ×10 IMPLANT
SUT ETHIBOND 2 0 V4 (SUTURE) IMPLANT
SUT ETHIBOND 2 0V4 GREEN (SUTURE) IMPLANT
SUT ETHIBOND NAB MH 2-0 36IN (SUTURE) ×36 IMPLANT
SUT ETHILON 3 0 FSL (SUTURE) ×3 IMPLANT
SUT PROLENE 3 0 SH 1 (SUTURE) ×3 IMPLANT
SUT PROLENE 3 0 SH 48 (SUTURE) ×3 IMPLANT
SUT PROLENE 3 0 SH DA (SUTURE) ×6 IMPLANT
SUT PROLENE 4 0 RB 1 (SUTURE) ×11
SUT PROLENE 4 0 SH DA (SUTURE) IMPLANT
SUT PROLENE 4-0 RB1 .5 CRCL 36 (SUTURE) ×22 IMPLANT
SUT PROLENE 5 0 C1 (SUTURE) IMPLANT
SUT SILK  1 MH (SUTURE) ×5
SUT SILK 1 MH (SUTURE) ×10 IMPLANT
SUT SILK 1 TIES 10X30 (SUTURE) ×3 IMPLANT
SUT SILK 2 0 SH CR/8 (SUTURE) ×6 IMPLANT
SUT STEEL 6MS V (SUTURE) ×12 IMPLANT
SUT STEEL SZ 6 DBL 3X14 BALL (SUTURE) ×3 IMPLANT
SUT TEM PAC WIRE 2 0 SH (SUTURE) IMPLANT
SUT VIC AB 1 CTX 27 (SUTURE) ×6 IMPLANT
SUT VIC AB 1 CTX 36 (SUTURE) ×3
SUT VIC AB 1 CTX36XBRD ANBCTR (SUTURE) ×6 IMPLANT
SUT VIC AB 2-0 CT1 27 (SUTURE) ×1
SUT VIC AB 2-0 CT1 TAPERPNT 27 (SUTURE) ×2 IMPLANT
SUT VIC AB 2-0 CTX 27 (SUTURE) ×6 IMPLANT
SUT VIC AB 3-0 SH 8-18 (SUTURE) ×3 IMPLANT
SUT VIC AB 3-0 X1 27 (SUTURE) ×9 IMPLANT
SYR 50ML LL SCALE MARK (SYRINGE) IMPLANT
SYSTEM SAHARA CHEST DRAIN ATS (WOUND CARE) ×6 IMPLANT
TAPE CLOTH SURG 4X10 WHT LF (GAUZE/BANDAGES/DRESSINGS) ×3 IMPLANT
TAPE PAPER 2X10 WHT MICROPORE (GAUZE/BANDAGES/DRESSINGS) ×3 IMPLANT
TOWEL GREEN STERILE (TOWEL DISPOSABLE) ×6 IMPLANT
TOWEL GREEN STERILE FF (TOWEL DISPOSABLE) ×3 IMPLANT
TRAY CATH LUMEN 1 20CM STRL (SET/KITS/TRAYS/PACK) ×3 IMPLANT
TRAY FOLEY SLVR 14FR TEMP STAT (SET/KITS/TRAYS/PACK) ×3 IMPLANT
TRAY FOLEY SLVR 16FR TEMP STAT (SET/KITS/TRAYS/PACK) ×3 IMPLANT
TUBE CONNECTING 12X1/4 (SUCTIONS) ×3 IMPLANT
UNDERPAD 30X30 (UNDERPADS AND DIAPERS) ×6 IMPLANT
VENT LEFT HEART 12002 (CATHETERS)
WATER STERILE IRR 1000ML POUR (IV SOLUTION) ×12 IMPLANT
YANKAUER SUCT BULB TIP NO VENT (SUCTIONS) ×3 IMPLANT

## 2018-08-03 NOTE — Op Note (Signed)
CARDIOVASCULAR SURGERY OPERATIVE NOTE  Theodore Welch [id number] 08/03/2018   Surgeon:  Alleen Borne, MD  First Assistant: Kerin Perna, MD  Preoperative Diagnosis: Class IV heart failure with LVEF 20%   Postoperative Diagnosis:  Same   Procedure  1. Median Sternotomy 2. Extracorporeal circulation 3.   Implantation of HeartMate 3 Left Ventricular Assist Device.   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  He was admitted with progressive fluid overload with worsening shortness of breath, orthopnea, and peripheral edema.  Echocardiogram on admission also showed a dilated and severely hypokinetic right ventricle with severe tricuspid regurgitation as well as moderate to severe mitral regurgitation.  He has improved since being started on milrinone with good cardiac output and a drop in his filling pressures to normal with Co-ox  76% with a CVP of 2-3.  His CVP/wedge ratio was 0.66 and his PAPi 2.65 on his right heart catheterization.  This may suggest that he has better right ventricular function then his initial echocardiogram would suggest.  I agree that he will need advanced therapies and is not a transplant candidate at this time due to occasionally smoking marijuana and cigarettes prior to admission.  I think he is acceptable candidate for LVAD therapy.   I discussed advanced heart failure therapies with him including heart transplantation, LVAD therapy, and home inotropic therapy.  He is not currently a heart transplant candidate so I think LVAD therapy is the best option for him.  I discussed the operative procedure with the patient and his wife including alternatives, benefits and risks; including but not limited to bleeding, blood transfusion, infection,  stroke, myocardial infarction, heart block requiring a permanent pacemaker, organ dysfunction, pump malfunction or thrombosis possibly requiring replacement, GI bleeding, right heart failure possibly requiring long term inotropic therapy or RVAD placement and death.  Theodore Welch understands and agrees to proceed.      Preparation:  The patient was seen in the preoperative holding area and the correct patient, correct operation were confirmed with the patient after reviewing the medical record and catheterization. The consent was signed by me. Preoperative antibiotics were given. A pulmonary arterial line and radial arterial line were placed by the anesthesia team. The patient's old PICC line was removed. The patient was taken back to the operating room and positioned supine on the operating room table. After being placed under general endotracheal anesthesia by the anesthesia team a foley catheter was placed. The neck, chest, abdomen, and both legs were prepped with betadine soap and solution and draped in the usual sterile manner. A surgical time-out was taken and the correct patient and operative procedure were confirmed with the nursing and anesthesia staff.   Pre-bypass TEE:   Complete TEE assessment was performed by Dr. Adonis Huguenin.   PHILIPPOS ROTI  ECHO TEE (OR) W 3D  Order# [id number]  Reading physician: Heather Roberts, MD Ordering physician: Alleen Borne, MD Study date: 08/03/18  Conclusions   Result status: Edited Result - FINAL   Septum: No Patent Foramen Ovale present.  Left atrium: Patent foramen ovale not present.  Left atrium: Cavity is moderately dilated.  Mitral valve: Dilated mitral annulus. No leaflet thickening and calcification present. Mild mitral annular calcification. Moderate to severe regurgitation.  Right ventricle: Normal wall thickness. Cavity is moderately to severely dilated. Moderate to  severely reduced systolic function.  Right atrium: Cavity is  moderately dilated.  Tricuspid valve: Valve has a dilated annulus. Mild to moderate regurgitation. The tricuspid valve regurgitation jet is central.  Pulmonic valve: Mild regurgitation.    Median sternotomy:    A median sternotomy was performed. The pericardium was opened in the midline. Right ventricular function appeared moderately depressed with mild distension. The ascending aorta was of small size and had no palpable plaque. There were no contraindications to aortic cannulation or cross-clamping. The pericardium was opened along the left diaphragm out to the apex. The left diaphragm was taken down from the anterior chest wall over a short distance medially using electrocautery. Then a 4 cm transverse incision was made to the left of the umbilicus and continued down to the rectus fascia using electrocautery. A skin punch was used to create the drive line exit site about 3 cm below the right costal margin in the anterior axillary line. The straight tunneling device was passed in a subcutaneous plane from the transverse abdominal incision to a point midway to the pericardium and then through the rectus fascia into the pre-peritoneal space. Care was taken to make sure that the peritoneum was separated from the posterior rectus fascia at this location and the entrance site of the trocar into the preperitoneal space was observed while passing the trocar to prevent intraabdominal injury. The  tunneling device was passed from the abdominal incision the the skin exit site.  Cardiopulmonary Bypass:   The patient was fully systemically heparinized and the ACT was maintained > 400 sec. The proximal aortic arch was cannulated with a 20 F aortic cannula for arterial inflow. Venous cannulation was performed via the right atrial appendage using a two-staged venous cannula. Carbon dioxide was insufflated into the pericardium at 6L/min throughout the procedure to minimize intracardiac air. The systemic temperature  was allowed to drift downward slightly during the procedure and the patient was actively rewarmed.   Insertion of apical outflow cannula:  The patient was placed on cardiopulmonary bypass. Laparotomy pads were placed behind the heart to aid exposure of the apex. A site was chosen on the anterior wall lateral to the LAD and superior to the true apex. A stab incision was made and a foley catheter placed through the coring knife and into the left ventricle. The balloon was inflated and with gentle traction on the catheter the coring knife was carefully advanced toward the mitral valve to create the ventriculotomy. The balloon was deflated and removed. A sump was placed into the LV and the ventricular core excised and sent to pathology. Trabeculations that might cause obstruction were excised. Then a series of 2-0 Ethibond horizontal mattress sutures were placed around the ventriculotomy. This took 12 sutures. The sutures were placed through the apical cuff. The sutures were tied and the apical cuff appeared to be well-sealed to the apex. Coseal was applied to seal needle holes. Then volume was left in the heart and the lungs inflated. The HeartMate 3 was brought onto the operative field. The inflow cannula was inserted into the apical cuff and the apical cuff lock engaged.  The outflow end of the pump was connected to vent suction and the heart and pump deaired by filling the heart and ventilating the lungs. With the LV distended the apical cuff appeared hemostatic. The drive line was then tunneled using the previously placed tunneling devices. The outflow graft and bend relief were already attached to the pump by the nurses. The black  line on the graft was oriented along the margin of the pericardium to prevent twisting of the graft. The bend relief was fully engaged and tested with an axial pull. The pump was turned into the appropriate position. The heart and pump were returned to the pericardium and appeared  to sit nicely.   Aortic outflow graft anastomosis:  The outflow graft was distended and positioned along the inferior margin of the heart and around the right atrium. The outflow graft was cut to the appropriate length. The ascending aorta was partially occluded with a Lemole clamp. A slightly diagonall midline aortotomy was created and the ends enlarged with a 4.5 mm punch. The outflow graft was anastomosed to the aorta using 4-0 prolene pledgetted sutures at the toe and heal that were run down each side. Coseal was used to seal the needle holes in the graft. The Lemole clamp was removed and the graft clamped with a peripheral Debakey clamp and deaired using a 21 gauge needle. The aortic anastomosis was hemostatic.   Weaning from bypass:  The patient was started on Milrinone 0.3 mcg/kg/min, epinephrine 4 mcg/kg/min, levophed at 4 mcg/kg/min and nitric oxide at 30 ppm. The HeartMate 3 pump was started at 3000 rpm. TEE confirmed that the intracardiac air was removed.  Cardiopulmonary bypass flow was decreased to half flow and the clamp removed from the outflow graft. The speed was gradually increased to 4000 rpm. Flow was decreased to 1/4 and the speed gradually increased to 4800 rpm. Cardiopulmonary bypass was discontinued and the speed increased to 5400 rpm.  Pulmonary artery pressures were normal  with a CVP of 8. CO was 5.5 L/min. TEE showed excellent apical cannula position. There was mild MR, trivial TR. RV function appeared good.  Completion:  Two temporary epicardial pacing wires were placed on the right ventricle and right atrium. Heparin was fully reversed with protamine and the aortic and venous cannulas removed. Hemostasis was achieved. Two mediastinal drainage tubes and a left pleural chest tube and a pocket drain were placed.  The sternum was closed with  #6 stainless steel wires. The fascia was closed with continuous # 1 vicryl suture. The subcutaneous tissue was closed with 2-0 vicryl  continuous suture. The skin was closed with 3-0 vicryl subcuticular suture. The abdominal incision was closed with continuous 3-0 vicryl subcutaneous suture and 3-0 vicryl subcuticular skin suture. The drive line exit site was re-approximated  3-0 nylon skin sutures. The two drive line fasteners were applied. All sponge, needle, and instrument counts were reported correct at the end of the case. Dry sterile dressings were placed over the incisions and around the chest tubes which were connected to pleurevac suction. The patient was then transported to the surgical intensive care unit in critical but stable condition.

## 2018-08-03 NOTE — Transfer of Care (Signed)
Immediate Anesthesia Transfer of Care Note  Patient: Theodore Welch  Procedure(s) Performed: INSERTION OF IMPLANTABLE LEFT VENTRICULAR ASSIST DEVICE - HM3 (N/A Chest) TRANSESOPHAGEAL ECHOCARDIOGRAM (TEE) (N/A )  Patient Location: SICU  Anesthesia Type:General  Level of Consciousness: sedated and Patient remains intubated per anesthesia plan  Airway & Oxygen Therapy: Patient remains intubated per anesthesia plan and Patient placed on Ventilator (see vital sign flow sheet for setting)  Post-op Assessment: Report given to RN and Post -op Vital signs reviewed and stable  Post vital signs: Reviewed and stable  Last Vitals:  Vitals Value Taken Time  BP 99/69 08/03/2018  1:18 PM  Temp    Pulse    Resp 12 08/03/2018  1:25 PM  SpO2    Vitals shown include unvalidated device data.  Last Pain:  Vitals:   08/03/18 0523  TempSrc: Oral  PainSc:       Patients Stated Pain Goal: 2 (08/02/18 0354)  Complications: No apparent anesthesia complications

## 2018-08-03 NOTE — Care Management Note (Addendum)
Case Management Note Previous note by Raynald Blend RNCM Patient Details  Name: YASSIN KRAVETS MRN: [id number] Date of Birth: 1960-11-22  Subjective/Objective:   PT admitted with advanced HF - tentative plan is for pt to receive LVAD                 Action/Plan:  PTA Independent from home.  Pt has PCP and denies barriers with obtaining and paying for medications.     Expected Discharge Date:                  Expected Discharge Plan:     In-House Referral:  Clinical Social Work  Discharge planning Services  CM Consult  Post Acute Care Choice:    Choice offered to:     DME Arranged:    DME Agency:     HH Arranged:    HH Agency:     Status of Service:     If discussed at [employer], dates discussed:    Additional Comments: 08/03/18 @ 1322-Crystalyn Delia RNCM-CM consult acknowledged. POD#0 LVAD placement, with CM to follow for needs.   Colleen Can RN, BSN, NCM-BC, ACM-RN [phone] 08/03/2018, 1:22 PM

## 2018-08-03 NOTE — Anesthesia Postprocedure Evaluation (Signed)
Anesthesia Post Note  Patient: Theodore Welch  Procedure(s) Performed: INSERTION OF IMPLANTABLE LEFT VENTRICULAR ASSIST DEVICE - HM3 (N/A Chest) TRANSESOPHAGEAL ECHOCARDIOGRAM (TEE) (N/A )     Patient location during evaluation: SICU Anesthesia Type: General Level of consciousness: sedated Pain management: pain level controlled Vital Signs Assessment: post-procedure vital signs reviewed and stable Respiratory status: patient remains intubated per anesthesia plan Cardiovascular status: stable Postop Assessment: no apparent nausea or vomiting Anesthetic complications: no    Last Vitals:  Vitals:   08/03/18 0523 08/03/18 1325  BP: 92/73 (!) 74/68  Pulse: 100 89  Resp: (!) 22 12  Temp: 37.1 C   SpO2: 99%     Last Pain:  Vitals:   08/03/18 0523  TempSrc: Oral  PainSc:                  Theodore Welch

## 2018-08-03 NOTE — Progress Notes (Signed)
  Echocardiogram Echocardiogram Transesophageal has been performed.  Celene Skeen 08/03/2018, 8:41 AM

## 2018-08-03 NOTE — Progress Notes (Signed)
Pharmacy Antibiotic Note  Theodore Welch is a 57 y.o. male admitted on 07/21/2018 with surgical prophylaxis.  Pharmacy has been consulted for vancomycin dosing x 48 hrs.  Plan: Continue vancomycin 1g IV q 12 hrs x 3 additional doses.  Height: 5\' 10"  (177.8 cm) Weight: 138 lb 0.1 oz (62.6 kg) IBW/kg (Calculated) : 73  Temp (24hrs), Avg:98.2 F (36.8 C), Min:97.6 F (36.4 C), Max:98.7 F (37.1 C)  Recent Labs  Lab 07/30/18 1049 07/31/18 0628 08/01/18 0500 08/02/18 0407 08/03/18 0412 08/03/18 0805 08/03/18 0919 08/03/18 0947 08/03/18 1024 08/03/18 1136  WBC 7.5 8.5 8.7 10.0 8.6  --   --   --   --   --   CREATININE 0.99 1.01 0.91 0.96 0.78 0.60* 0.60* 0.60* 0.50* 0.60*    Estimated Creatinine Clearance: 88 mL/min (A) (by C-G formula based on SCr of 0.6 mg/dL (L)).    No Known Allergies   Thank you for allowing pharmacy to be a part of this patient's care.  Jenetta Downer, [facility] Clinical Pharmacist Phone [phone]  08/03/2018 1:28 PM

## 2018-08-03 NOTE — Anesthesia Procedure Notes (Signed)
Central Venous Catheter Insertion Performed by: Heather Roberts, MD, anesthesiologist Start/End9/16/2019 6:48 AM, 08/03/2018 6:59 AM Patient location: Pre-op. Preanesthetic checklist: patient identified, IV checked, site marked, risks and benefits discussed, surgical consent, monitors and equipment checked, pre-op evaluation, timeout performed and anesthesia consent Position: Trendelenburg Lidocaine 1% used for infiltration and patient sedated Hand hygiene performed , maximum sterile barriers used  and Seldinger technique used Catheter size: 8.5 Fr Total catheter length 8. PA cath was placed.Sheath introducer Swan type:thermodilution PA Cath depth:50 Procedure performed using ultrasound guided technique. Ultrasound Notes:anatomy identified, needle tip was noted to be adjacent to the nerve/plexus identified, no ultrasound evidence of intravascular and/or intraneural injection and image(s) printed for medical record Attempts: 1 Following insertion, line sutured, dressing applied and Biopatch. Post procedure assessment: free fluid flow, blood return through all ports and no air  Patient tolerated the procedure well with no immediate complications.

## 2018-08-03 NOTE — Brief Op Note (Signed)
08/03/2018  1:05 PM  PATIENT:  Theodore Welch  57 y.o. male  PRE-OPERATIVE DIAGNOSIS:  ICM  POST-OPERATIVE DIAGNOSIS:  ICM  PROCEDURE:  Procedure(s) with comments: INSERTION OF IMPLANTABLE LEFT VENTRICULAR ASSIST DEVICE - HM3 (N/A) - HM3 TRANSESOPHAGEAL ECHOCARDIOGRAM (TEE) (N/A)  SURGEON:  Surgeon(s) and Role:    * Bartle, Payton Doughty, MD - Primary    * Kerin Perna, MD - Assisting  PHYSICIAN ASSISTANT: none  ASSISTANTS: Tanda Rockers, RNFA   ANESTHESIA:   general  EBL:  705 mL   BLOOD ADMINISTERED:3 units FFP  DRAINS: 2 mediastinal drains, left pleural and pocket drain.   LOCAL MEDICATIONS USED:  NONE  SPECIMEN:  Apical myocardial plug  DISPOSITION OF SPECIMEN:  PATHOLOGY  COUNTS:  YES  TOURNIQUET:  * No tourniquets in log *  DICTATION: .Note written in EPIC  PLAN OF CARE: Admit to inpatient   PATIENT DISPOSITION:  ICU - intubated and critically ill.   Delay start of Pharmacological VTE agent (>24hrs) due to surgical blood loss or risk of bleeding: yes

## 2018-08-03 NOTE — Progress Notes (Signed)
Transported pt to OR on tele monitor.  Report given to [facility] Anesthesia.  VS stable. Karena Addison T

## 2018-08-03 NOTE — OR Nursing (Signed)
11:30 - 45 minute call to SICU charge nurse 12:20 - 20 minute call to SICU charge nurse

## 2018-08-03 NOTE — Anesthesia Procedure Notes (Signed)
Arterial Line Insertion Start/End9/16/2019 7:00 AM, 08/03/2018 7:15 AM Performed by: Rosalio Macadamia, CRNA, CRNA  Patient location: Pre-op. Preanesthetic checklist: patient identified, IV checked, site marked, risks and benefits discussed, surgical consent, monitors and equipment checked, pre-op evaluation, timeout performed and anesthesia consent Lidocaine 1% used for infiltration and patient sedated Left, radial was placed Catheter size: 20 G Hand hygiene performed  and maximum sterile barriers used  Allen's test indicative of satisfactory collateral circulation Attempts: 2 Procedure performed without using ultrasound guided technique.

## 2018-08-03 NOTE — Progress Notes (Signed)
Chaplain received a request from the Nursing Unit for assistance in helping this patient with completion of a Advance Directive for a patient who is having surgery on tomorrow. The [facility] provided the Notary of the document, and visitors on the Unit provided witnesses for this the AD.  A copy was placed in the patient's medical record, and copies were provided for the designated HCPOA. Chaplain Janell Quiet [phone]

## 2018-08-03 NOTE — Progress Notes (Signed)
EKG obtained and placed in chart.  Prep clipped and hibiclens per order.  Karena Addison T

## 2018-08-04 ENCOUNTER — Encounter (HOSPITAL_COMMUNITY): Payer: Self-pay | Admitting: Surgery

## 2018-08-04 ENCOUNTER — Inpatient Hospital Stay (HOSPITAL_COMMUNITY): Payer: Medicare Other

## 2018-08-04 DIAGNOSIS — Z95811 Presence of heart assist device: Secondary | ICD-10-CM

## 2018-08-04 LAB — CBC
HEMATOCRIT: 28.7 % — AB (ref 39.0–52.0)
HEMOGLOBIN: 9.1 g/dL — AB (ref 13.0–17.0)
MCH: 28.4 pg (ref 26.0–34.0)
MCHC: 31.7 g/dL (ref 30.0–36.0)
MCV: 89.7 fL (ref 78.0–100.0)
Platelets: 144 10*3/uL — ABNORMAL LOW (ref 150–400)
RBC: 3.2 MIL/uL — AB (ref 4.22–5.81)
RDW: 15.9 % — ABNORMAL HIGH (ref 11.5–15.5)
WBC: 16.1 10*3/uL — ABNORMAL HIGH (ref 4.0–10.5)

## 2018-08-04 LAB — BPAM FFP
BLOOD PRODUCT EXPIRATION DATE: 201909192359
BLOOD PRODUCT EXPIRATION DATE: 201909202359
Blood Product Expiration Date: 201909192359
Blood Product Expiration Date: 201909202359
ISSUE DATE / TIME: 201909160943
ISSUE DATE / TIME: 201909160943
ISSUE DATE / TIME: 201909160943
ISSUE DATE / TIME: 201909162335
UNIT TYPE AND RH: 6200
UNIT TYPE AND RH: 6200
Unit Type and Rh: 6200
Unit Type and Rh: 6200

## 2018-08-04 LAB — POTASSIUM: Potassium: 3.9 mmol/L (ref 3.5–5.1)

## 2018-08-04 LAB — HEMOGLOBIN A1C
Hgb A1c MFr Bld: 6.2 % — ABNORMAL HIGH (ref 4.8–5.6)
Mean Plasma Glucose: 131 mg/dL

## 2018-08-04 LAB — COOXEMETRY PANEL
Carboxyhemoglobin: 1.5 % (ref 0.5–1.5)
Carboxyhemoglobin: 1.8 % — ABNORMAL HIGH (ref 0.5–1.5)
METHEMOGLOBIN: 1.7 % — AB (ref 0.0–1.5)
METHEMOGLOBIN: 1.9 % — AB (ref 0.0–1.5)
O2 SAT: 72.7 %
O2 Saturation: 70.5 %
TOTAL HEMOGLOBIN: 8 g/dL — AB (ref 12.0–16.0)
Total hemoglobin: 8 g/dL — ABNORMAL LOW (ref 12.0–16.0)

## 2018-08-04 LAB — MAGNESIUM
MAGNESIUM: 2.3 mg/dL (ref 1.7–2.4)
MAGNESIUM: 1.8 mg/dL (ref 1.7–2.4)

## 2018-08-04 LAB — PREPARE FRESH FROZEN PLASMA
UNIT DIVISION: 0
Unit division: 0
Unit division: 0
Unit division: 0

## 2018-08-04 LAB — BLOOD GAS, ARTERIAL
Acid-Base Excess: 1.4 mmol/L (ref 0.0–2.0)
BICARBONATE: 25.7 mmol/L (ref 20.0–28.0)
FIO2: 50
Nitric Oxide: 10
O2 SAT: 99.8 %
PEEP: 5 cmH2O
PO2 ART: 231 mmHg — AB (ref 83.0–108.0)
PRESSURE SUPPORT: 10 cmH2O
Patient temperature: 97.9
RATE: 12 resp/min
VT: 600 mL
pCO2 arterial: 41.2 mmHg (ref 32.0–48.0)
pH, Arterial: 7.41 (ref 7.350–7.450)

## 2018-08-04 LAB — COMPREHENSIVE METABOLIC PANEL
ALBUMIN: 3.6 g/dL (ref 3.5–5.0)
ALK PHOS: 66 U/L (ref 38–126)
ALT: 18 U/L (ref 0–44)
ANION GAP: 7 (ref 5–15)
AST: 64 U/L — ABNORMAL HIGH (ref 15–41)
BILIRUBIN TOTAL: 2.5 mg/dL — AB (ref 0.3–1.2)
BUN: 14 mg/dL (ref 6–20)
CALCIUM: 9.8 mg/dL (ref 8.9–10.3)
CO2: 27 mmol/L (ref 22–32)
Chloride: 107 mmol/L (ref 98–111)
Creatinine, Ser: 0.62 mg/dL (ref 0.61–1.24)
GFR calc Af Amer: >60 mL/min (ref >60)
GFR calc non Af Amer: >60 mL/min (ref >60)
Glucose, Bld: 103 mg/dL — ABNORMAL HIGH (ref 70–99)
Potassium: 4.5 mmol/L (ref 3.5–5.1)
Sodium: 141 mmol/L (ref 135–145)
TOTAL PROTEIN: 6.1 g/dL — AB (ref 6.5–8.1)

## 2018-08-04 LAB — POCT I-STAT 3, ART BLOOD GAS (G3+)
BICARBONATE: 25.1 mmol/L (ref 20.0–28.0)
O2 Saturation: 99 %
TCO2: 26 mmol/L (ref 22–32)
pCO2 arterial: 39 mmHg (ref 32.0–48.0)
pH, Arterial: 7.414 (ref 7.350–7.450)
pO2, Arterial: 134 mmHg — ABNORMAL HIGH (ref 83.0–108.0)

## 2018-08-04 LAB — POCT I-STAT EG7
Acid-Base Excess: 3 mmol/L — ABNORMAL HIGH (ref 0.0–2.0)
Bicarbonate: 28.4 mmol/L — ABNORMAL HIGH (ref 20.0–28.0)
CALCIUM ION: 1.17 mmol/L (ref 1.15–1.40)
HCT: 27 % — ABNORMAL LOW (ref 39.0–52.0)
HEMOGLOBIN: 9.2 g/dL — AB (ref 13.0–17.0)
O2 Saturation: 79 %
Potassium: 4.1 mmol/L (ref 3.5–5.1)
SODIUM: 142 mmol/L (ref 135–145)
TCO2: 30 mmol/L (ref 22–32)
pCO2, Ven: 44.8 mmHg (ref 44.0–60.0)
pH, Ven: 7.408 (ref 7.250–7.430)
pO2, Ven: 43 mmHg (ref 32.0–45.0)

## 2018-08-04 LAB — GLUCOSE, CAPILLARY
GLUCOSE-CAPILLARY: 116 mg/dL — AB (ref 70–99)
GLUCOSE-CAPILLARY: 121 mg/dL — AB (ref 70–99)
GLUCOSE-CAPILLARY: 125 mg/dL — AB (ref 70–99)
GLUCOSE-CAPILLARY: 126 mg/dL — AB (ref 70–99)
GLUCOSE-CAPILLARY: 153 mg/dL — AB (ref 70–99)
Glucose-Capillary: 120 mg/dL — ABNORMAL HIGH (ref 70–99)
Glucose-Capillary: 98 mg/dL (ref 70–99)
Glucose-Capillary: 104 mg/dL — ABNORMAL HIGH (ref 70–99)
Glucose-Capillary: 98 mg/dL (ref 70–99)
Glucose-Capillary: 106 mg/dL — ABNORMAL HIGH (ref 70–99)
Glucose-Capillary: 144 mg/dL — ABNORMAL HIGH (ref 70–99)
Glucose-Capillary: 91 mg/dL (ref 70–99)
Glucose-Capillary: 121 mg/dL — ABNORMAL HIGH (ref 70–99)
Glucose-Capillary: 132 mg/dL — ABNORMAL HIGH (ref 70–99)
Glucose-Capillary: 146 mg/dL — ABNORMAL HIGH (ref 70–99)

## 2018-08-04 LAB — CBC WITH DIFFERENTIAL/PLATELET
Abs Immature Granulocytes: 0.1 10*3/uL (ref 0.0–0.1)
BASOS ABS: 0 10*3/uL (ref 0.0–0.1)
BASOS PCT: 0 %
EOS ABS: 0.1 10*3/uL (ref 0.0–0.7)
EOS PCT: 1 %
HCT: 26.6 % — ABNORMAL LOW (ref 39.0–52.0)
Hemoglobin: 8.3 g/dL — ABNORMAL LOW (ref 13.0–17.0)
Immature Granulocytes: 1 %
Lymphocytes Relative: 7 %
Lymphs Abs: 0.9 10*3/uL (ref 0.7–4.0)
MCH: 27.7 pg (ref 26.0–34.0)
MCHC: 31.2 g/dL (ref 30.0–36.0)
MCV: 88.7 fL (ref 78.0–100.0)
MONO ABS: 2.1 10*3/uL — AB (ref 0.1–1.0)
Monocytes Relative: 17 %
NEUTROS ABS: 9.5 10*3/uL — AB (ref 1.7–7.7)
Neutrophils Relative %: 74 %
PLATELETS: 114 10*3/uL — AB (ref 150–400)
RBC: 3 MIL/uL — ABNORMAL LOW (ref 4.22–5.81)
RDW: 15.5 % (ref 11.5–15.5)
WBC: 12.7 10*3/uL — ABNORMAL HIGH (ref 4.0–10.5)

## 2018-08-04 LAB — POCT I-STAT, CHEM 8
BUN: 12 mg/dL (ref 6–20)
CALCIUM ION: 1.33 mmol/L (ref 1.15–1.40)
CHLORIDE: 101 mmol/L (ref 98–111)
Creatinine, Ser: 0.5 mg/dL — ABNORMAL LOW (ref 0.61–1.24)
Glucose, Bld: 116 mg/dL — ABNORMAL HIGH (ref 70–99)
HCT: 30 % — ABNORMAL LOW (ref 39.0–52.0)
Hemoglobin: 10.2 g/dL — ABNORMAL LOW (ref 13.0–17.0)
Potassium: 3.9 mmol/L (ref 3.5–5.1)
Sodium: 138 mmol/L (ref 135–145)
TCO2: 26 mmol/L (ref 22–32)

## 2018-08-04 LAB — CREATININE, SERUM: CREATININE: 0.56 mg/dL — AB (ref 0.61–1.24)

## 2018-08-04 LAB — PROTIME-INR
INR: 1.14
Prothrombin Time: 14.5 seconds (ref 11.4–15.2)

## 2018-08-04 LAB — BRAIN NATRIURETIC PEPTIDE: B Natriuretic Peptide: 658.6 pg/mL — ABNORMAL HIGH (ref 0.0–100.0)

## 2018-08-04 LAB — LACTATE DEHYDROGENASE: LDH: 240 U/L — ABNORMAL HIGH (ref 98–192)

## 2018-08-04 LAB — PHOSPHORUS: Phosphorus: 4.4 mg/dL (ref 2.5–4.6)

## 2018-08-04 MED ORDER — ORAL CARE MOUTH RINSE
15.0000 mL | Freq: Two times a day (BID) | OROMUCOSAL | Status: DC
  Administered 2018-08-04 (×2): 15 mL via OROMUCOSAL

## 2018-08-04 MED ORDER — INSULIN ASPART 100 UNIT/ML ~~LOC~~ SOLN
0.0000 [IU] | SUBCUTANEOUS | Status: DC
  Administered 2018-08-04 (×3): 2 [IU] via SUBCUTANEOUS

## 2018-08-04 MED ORDER — INSULIN DETEMIR 100 UNIT/ML ~~LOC~~ SOLN
10.0000 [IU] | Freq: Every day | SUBCUTANEOUS | Status: DC
  Filled 2018-08-04: qty 0.1

## 2018-08-04 MED ORDER — ENSURE ENLIVE PO LIQD
237.0000 mL | Freq: Two times a day (BID) | ORAL | Status: DC
  Administered 2018-08-04 – 2018-08-09 (×9): 237 mL via ORAL

## 2018-08-04 MED ORDER — INSULIN DETEMIR 100 UNIT/ML ~~LOC~~ SOLN
10.0000 [IU] | Freq: Once | SUBCUTANEOUS | Status: AC
  Administered 2018-08-04: 10 [IU] via SUBCUTANEOUS
  Filled 2018-08-04: qty 0.1

## 2018-08-04 MED ORDER — EPINEPHRINE PF 1 MG/ML IJ SOLN
0.5000 ug/min | INTRAVENOUS | Status: DC
  Administered 2018-08-05: 0.5 ug/min via INTRAVENOUS
  Filled 2018-08-04: qty 4

## 2018-08-04 MED ORDER — FUROSEMIDE 10 MG/ML IJ SOLN
40.0000 mg | Freq: Two times a day (BID) | INTRAMUSCULAR | Status: DC
  Administered 2018-08-04 (×2): 40 mg via INTRAVENOUS
  Filled 2018-08-04 (×3): qty 4

## 2018-08-04 MED ORDER — WARFARIN SODIUM 2.5 MG PO TABS
2.5000 mg | ORAL_TABLET | Freq: Once | ORAL | Status: AC
  Administered 2018-08-04: 2.5 mg via ORAL
  Filled 2018-08-04: qty 1

## 2018-08-04 MED ORDER — HYDRALAZINE HCL 20 MG/ML IJ SOLN
10.0000 mg | INTRAMUSCULAR | Status: DC | PRN
  Administered 2018-08-04 – 2018-08-06 (×4): 10 mg via INTRAVENOUS
  Filled 2018-08-04 (×5): qty 1

## 2018-08-04 MED ORDER — WARFARIN - PHYSICIAN DOSING INPATIENT
Freq: Every day | Status: DC
  Administered 2018-08-08: 1

## 2018-08-04 MED ORDER — ENOXAPARIN SODIUM 40 MG/0.4ML ~~LOC~~ SOLN
40.0000 mg | Freq: Every day | SUBCUTANEOUS | Status: DC
  Administered 2018-08-04 – 2018-08-05 (×2): 40 mg via SUBCUTANEOUS
  Filled 2018-08-04 (×2): qty 0.4

## 2018-08-04 NOTE — Procedures (Signed)
Extubation Procedure Note  Patient Details:   Name: Theodore Welch DOB: 1961/08/21 MRN: [id number]   Airway Documentation:    Vent end date: 08/04/18 Vent end time: 0911   Evaluation  O2 sats: stable throughout Complications: No apparent complications Patient did tolerate procedure well. Bilateral Breath Sounds: Clear, Diminished   Yes   Pt extubated to 3L [place] per Open Heart Rapid Wean Protocol. ABG within normal limits. NIF -35, VC 1.3L. Pt able to speak and has a strong non productive cough post extubation. Nitric turned off at this time. No stridor, no increased WOB, Pt denies SOB. VS within normal limits.   Carolan Shiver 08/04/2018, 9:12 AM

## 2018-08-04 NOTE — Progress Notes (Signed)
CSW met with patient and caregiver [name] at bedside. Patient was getting ready to transfer to chair and stated he is feeling well. Patient appeared in good spirits and motivated for recovery. Patient's caregiver noted that she has begun to read the VAD manual and ready to get started learning the dressing changes. Both patient and caregiver appear in good spirits and motivated for recovery. CSW continues to follow for supportive intervention throughout implant hospitalization. Jackie Brennan, LCSW, CCSW-MCS [phone]  

## 2018-08-04 NOTE — Progress Notes (Addendum)
Pt VS-  A line 90/70 (77); Blood Pressure via cuff- 102/66, Doppler pressure 102 R Brachial. HR 111, 02- 100% on Room Air -Doppler pressures correlating with SBP. PI event noted at 2005. Current VAD settings: PF- 4.4/ PI- 4.6/ Speed-5400/ PP- 3.8  @0000 - Pt Hx showed 12 PI events between 2000 and 0000. MAPS currently  (59-65), Pt is ST with increased ectopy  frequent PVCs, non sustained VT,  Dr. Laneta Simmers contacted orders given for 1 Albumin and 3 runs K, and additional Albumin x1 if needed.  Will continue to monitor.

## 2018-08-04 NOTE — Progress Notes (Signed)
Patient ID: Theodore Welch, male   DOB: May 07, 1961, 57 y.o.   MRN: [id number] HeartMate 3 Rounding Note  Subjective:    Extubated this am. Feels fine. Pain controlled. PA 31/13 CI 2.5 CVP 10 Epi 0.5, milrinone 0.375  LVAD INTERROGATION:  HeartMate IIl LVAD:  Flow 4.2 liters/min, speed 5400, power 4, PI 3.3.    Objective:    Vital Signs:   Temp:  [96.6 F (35.9 C)-98.2 F (36.8 C)] 97.3 F (36.3 C) (09/17 0930) Pulse Rate:  [28-118] 42 (09/17 0930) Resp:  [10-22] 21 (09/17 0930) BP: (74-117)/(49-85) 94/79 (09/17 0900) SpO2:  [100 %] 100 % (09/17 0930) Arterial Line BP: (70-131)/(53-82) 86/68 (09/17 0930) FiO2 (%):  [50 %] 50 % (09/17 0800) Weight:  [71.5 kg] 71.5 kg (09/17 0424) Last BM Date: 08/02/18 Mean arterial Pressure 80's off levophed  Intake/Output:   Intake/Output Summary (Last 24 hours) at 08/04/2018 1058 Last data filed at 08/04/2018 1000 Gross per 24 hour  Intake 5474.39 ml  Output 3645 ml  Net 1829.39 ml     Physical Exam: General:  Well appearing. No resp difficulty HEENT: normal Cor: occasional heart sounds with LVAD hum present. Lungs: clear Chest dressing dry Abdomen: soft, nontender, nondistended. No hepatosplenomegaly. No bruits or masses. few bowel sounds. Extremities: no cyanosis, clubbing, rash, edema Neuro: alert & orientedx3, cranial nerves grossly intact. moves all 4 extremities w/o difficulty. Affect pleasant  Telemetry: sinus 80's  Labs: Basic Metabolic Panel: Recent Labs  Lab 08/01/18 0500 08/02/18 0407 08/03/18 0412  08/03/18 1136 08/03/18 1224 08/03/18 1317 08/03/18 1329 08/03/18 1920 08/03/18 1926 08/04/18 0311  NA 139 138 137   < > 138 142 142 140  --  139 141  K 4.6 4.6 4.3   < > 4.4 4.1 4.3 4.3  --  4.2 4.5  CL 101 104 99   < > 101  --  105 102  --  103 107  CO2 30 27 28   --   --   --  27  --   --   --  27  GLUCOSE 111* 140* 102*   < > 145*  --  109* 111*  --  151* 103*  BUN 24* 24* 27*   < > 22*  --  19 20  --  20  14  CREATININE 0.91 0.96 0.78   < > 0.60*  --  0.70 0.70 0.73 0.60* 0.62  CALCIUM 10.2 9.9 10.1  --   --   --  9.0  --   --   --  9.8  MG  --   --   --   --   --   --  1.7  --  2.7*  --  2.3  PHOS  --   --   --   --   --   --   --   --   --   --  4.4   < > = values in this interval not displayed.    Liver Function Tests: Recent Labs  Lab 07/29/18 0302 07/30/18 1049 07/31/18 0628 08/01/18 0500 08/04/18 0311  AST 25 26 24 26  64*  ALT 19 20 20 22 18   ALKPHOS 97 105 97 103 66  BILITOT 1.6* 1.5* 1.7* 1.9* 2.5*  PROT 5.6* 6.4* 6.3* 6.5 6.1*  ALBUMIN 3.0* 3.2* 3.2* 3.4* 3.6   No results for input(s): LIPASE, AMYLASE in the last 168 hours. No results for input(s): AMMONIA in the last 168 hours.  CBC: Recent Labs  Lab 08/02/18 0407 08/03/18 0412  08/03/18 1025  08/03/18 1317 08/03/18 1329 08/03/18 1920 08/03/18 1926 08/04/18 0311  WBC 10.0 8.6  --   --   --  9.4  --  9.8  --  12.7*  NEUTROABS  --   --   --   --   --   --   --   --   --  9.5*  HGB 11.7* 11.9*   < > 9.4*   < > 8.4* 9.2* 8.8* 9.2* 8.3*  HCT 37.3* 37.4*   < > 28.6*   < > 26.0* 27.0* 27.7* 27.0* 26.6*  MCV 88.6 88.2  --   --   --  88.4  --  89.4  --  88.7  PLT 208 219  --  178  --  120*  120*  --  134*  --  114*   < > = values in this interval not displayed.    INR: Recent Labs  Lab 08/02/18 2309 08/03/18 1317 08/04/18 0311  INR 1.03 1.33 1.14    Other results:  EKG:   Imaging: Dg Chest Port 1 View  Result Date: 08/04/2018 CLINICAL DATA:  LVAD present EXAM: PORTABLE CHEST 1 VIEW COMPARISON:  Yesterday FINDINGS: Endotracheal tube tip between the clavicular heads and carina. There is a Swan-Ganz catheter from the right with tip at the main pulmonary artery. An orogastric tube at least reaches the stomach. Thoracic drains in place without visible pneumothorax. Stable cardiac enlargement. No edema or effusion. IMPRESSION: Stable chest.  No failure or visible pneumothorax. Electronically Signed   By:  Marnee Spring M.D.   On: 08/04/2018 09:16   Dg Chest Port 1 View  Result Date: 08/03/2018 CLINICAL DATA:  LVAD placement EXAM: PORTABLE CHEST 1 VIEW COMPARISON:  Yesterday FINDINGS: New partially covered LVAD. There is endotracheal tube with tip between the clavicular heads and carina. An orogastric tube reaches the stomach at least. Thoracic drains in place. Cardiomegaly similar to prior when accounting for differences in technique and lower volumes. Swan-Ganz catheter from the right with tip at the main pulmonary artery. No edema, effusion, or pneumothorax. Extensive artifact from EKG leads. IMPRESSION: Expected postoperative chest with hardware described above. Electronically Signed   By: Marnee Spring M.D.   On: 08/03/2018 14:07      Medications:     Scheduled Medications: . acetaminophen  1,000 mg Oral Q6H   Or  . acetaminophen (TYLENOL) oral liquid 160 mg/5 mL  1,000 mg Per Tube Q6H  . aspirin EC  325 mg Oral Daily   Or  . aspirin  324 mg Per Tube Daily   Or  . aspirin  300 mg Rectal Daily  . bisacodyl  10 mg Oral Daily   Or  . bisacodyl  10 mg Rectal Daily  . digoxin  0.125 mg Oral Daily  . docusate sodium  200 mg Oral Daily  . DULoxetine  60 mg Oral Daily  . enoxaparin (LOVENOX) injection  40 mg Subcutaneous QHS  . gabapentin  600 mg Oral TID  . insulin aspart  0-24 Units Subcutaneous Q4H  . insulin detemir  10 Units Subcutaneous Once  . [START ON 08/05/2018] insulin detemir  10 Units Subcutaneous Daily  . insulin regular  0-10 Units Intravenous TID WC  . mouth rinse  15 mL Mouth Rinse BID  . metoCLOPramide (REGLAN) injection  10 mg Intravenous Q6H  . [START ON 08/05/2018] pantoprazole  40 mg Oral Daily  .  sodium chloride flush  3 mL Intravenous Q12H  . thiamine  100 mg Oral Daily  . warfarin  2.5 mg Oral ONCE-1800     Infusions: . sodium chloride Stopped (08/04/18 0917)  . sodium chloride    . sodium chloride 10 mL/hr at 08/04/18 0401  . albumin human 12.5 g  (08/03/18 2051)  . cefUROXime (ZINACEF)  IV Stopped (08/04/18 0645)  . dexmedetomidine (PRECEDEX) IV infusion Stopped (08/04/18 0900)  . epinephrine 0.5 mcg/min (08/04/18 1000)  . insulin (NOVOLIN-R) infusion 0.9 mL/hr at 08/04/18 1000  . lactated ringers 10 mL/hr at 08/04/18 0700  . lactated ringers 20 mL/hr at 08/04/18 1000  . milrinone 0.375 mcg/kg/min (08/04/18 1011)  . norepinephrine (LEVOPHED) Adult infusion 1 mcg/min (08/04/18 1000)  . vancomycin 1,000 mg (08/04/18 0917)  . vasopressin (PITRESSIN) infusion - *FOR SHOCK* Stopped (08/03/18 1541)     PRN Medications:  sodium chloride, albumin human, midazolam, morphine injection, nicotine, ondansetron (ZOFRAN) IV, oxyCODONE, sodium chloride flush, traMADol   Assessment/Plan/Discussion:     POD 1 s/p Heartmate III for nonischemic cardiomyopathy with EF 10-20% on admission with dilated and hypokinetic RV, moderate to severe MR and severe TR. He tuned up nicely preop with milrinone and diuresis with reduction of CVP to normal, moderate MR and mild to moderate TR.  Hemodynamics look good on milrinone and small dose of epi.  MAP is up since extubation to 90 so I think we can diurese and use prn hydralazine if needed.  DC MT's today. Continue left pleural tube and pocket drain and probably remove them tomorrow.  Start Coumdin 2.5 tonight. Continue ASA. Lovenox 40 for DVT prophylaxis until INR rises. He had a right peroneal DVT preop.  Remove swan  DM: start Levemir 10 units and SSI every 4.  IS, OOB.   I reviewed the LVAD parameters from today, and compared the results to the patient's prior recorded data.  No programming changes were made.  The LVAD is functioning within specified parameters.  The patient performs LVAD self-test daily.  LVAD interrogation was negative for any significant power changes, alarms or PI events/speed drops.  LVAD equipment check completed and is in good working order.  Back-up equipment present.   LVAD  education done on emergency procedures and precautions and reviewed exit site care.  Length of Stay: [address]  Payton Doughty [facility] 08/04/2018, 10:58 AM

## 2018-08-04 NOTE — Progress Notes (Addendum)
Patient ID: Theodore Welch, male   DOB: 1961-02-05, 57 y.o.   MRN: [id number]     Advanced Heart Failure Rounding Note  PCP-Cardiologist: No primary care provider on file.   Subjective:   S/P HMIII on 9/16  On epi 0.5 mcg, norepi 1 mcg, and milrinone 0.375 mcg. CO-OX 73%. Extubated this morning.   Swan numbers this am.  CVP 12 PAP 26/13  CO 4.5 CI 2.5   Feeling ok.   LDH 240, creatinine 0.62, tbili 2.5, hgb 8.3, WBCs 12.7.   HMIII Speed 5400 Flow 4.5 Power 4 PI 2.6  Objective:    Weight Range: 71.5 kg Body mass index is 22.62 kg/m.   Vital Signs:   Temp:  [96.6 F (35.9 C)-98.2 F (36.8 C)] 97.5 F (36.4 C) (09/17 0815) Pulse Rate:  [28-118] 71 (09/17 0815) Resp:  [10-22] 18 (09/17 0815) BP: (74-117)/(49-85) 86/76 (09/17 0723) SpO2:  [100 %] 100 % (09/17 0911) Arterial Line BP: (70-131)/(53-82) 83/69 (09/17 0815) FiO2 (%):  [50 %] 50 % (09/17 0723) Weight:  [71.5 kg] 71.5 kg (09/17 0424) Last BM Date: 08/02/18  Weight change: Filed Weights   08/02/18 0500 08/03/18 0403 08/04/18 0424  Weight: 63.6 kg 62.6 kg 71.5 kg   Intake/Output:   Intake/Output Summary (Last 24 hours) at 08/04/2018 0926 Last data filed at 08/04/2018 0900 Gross per 24 hour  Intake 5310.4 ml  Output 3750 ml  Net 1560.4 ml    Physical Exam   CVP 10-11 Physical Exam: GENERAL:NAD . In bed.  HEENT: normal  NECK: Supple, JVP difficult to assess. Carotids  2+ bilaterally, no bruits.  No lymphadenopathy or thyromegaly appreciated.   CARDIAC:  Mechanical heart sounds with LVAD hum present.  LUNGS:  Clear to auscultation bilaterally on 2 liters .  ABDOMEN:  Soft, round, nontender, no bowel sounds.     LVAD exit site: dressing intact  EXTREMITIES:  Warm and dry, no cyanosis, clubbing, rash or edema  NEUROLOGIC:  A&Ox3 MAE with SCDs in place.   Telemetry   NSR 70-80s personally checked.   EKG    No new tracings.    Labs    CBC Recent Labs    08/03/18 1920 08/03/18 1926  08/04/18 0311  WBC 9.8  --  12.7*  NEUTROABS  --   --  9.5*  HGB 8.8* 9.2* 8.3*  HCT 27.7* 27.0* 26.6*  MCV 89.4  --  88.7  PLT 134*  --  114*   Basic Metabolic Panel Recent Labs    88/91/69 1317  08/03/18 1920 08/03/18 1926 08/04/18 0311  NA 142   < >  --  139 141  K 4.3   < >  --  4.2 4.5  CL 105   < >  --  103 107  CO2 27  --   --   --  27  GLUCOSE 109*   < >  --  151* 103*  BUN 19   < >  --  20 14  CREATININE 0.70   < > 0.73 0.60* 0.62  CALCIUM 9.0  --   --   --  9.8  MG 1.7  --  2.7*  --  2.3  PHOS  --   --   --   --  4.4   < > = values in this interval not displayed.   Liver Function Tests Recent Labs    08/04/18 0311  AST 64*  ALT 18  ALKPHOS 66  BILITOT 2.5*  PROT 6.1*  ALBUMIN 3.6   No results for input(s): LIPASE, AMYLASE in the last 72 hours. Cardiac Enzymes No results for input(s): CKTOTAL, CKMB, CKMBINDEX, TROPONINI in the last 72 hours.  BNP: BNP (last 3 results) Recent Labs    07/21/18 0540  BNP 1,343.9*    ProBNP (last 3 results) No results for input(s): PROBNP in the last 8760 hours.   D-Dimer Recent Labs    08/03/18 1317  DDIMER 1.62*   Hemoglobin A1C Recent Labs    08/03/18 0413  HGBA1C 6.2*   Fasting Lipid Panel No results for input(s): CHOL, HDL, LDLCALC, TRIG, CHOLHDL, LDLDIRECT in the last 72 hours. Thyroid Function Tests No results for input(s): TSH, T4TOTAL, T3FREE, THYROIDAB in the last 72 hours.  Invalid input(s): FREET3  Other results:   Imaging    Dg Chest Port 1 View  Result Date: 08/04/2018 CLINICAL DATA:  LVAD present EXAM: PORTABLE CHEST 1 VIEW COMPARISON:  Yesterday FINDINGS: Endotracheal tube tip between the clavicular heads and carina. There is a Swan-Ganz catheter from the right with tip at the main pulmonary artery. An orogastric tube at least reaches the stomach. Thoracic drains in place without visible pneumothorax. Stable cardiac enlargement. No edema or effusion. IMPRESSION: Stable chest.  No  failure or visible pneumothorax. Electronically Signed   By: Marnee Spring M.D.   On: 08/04/2018 09:16   Dg Chest Port 1 View  Result Date: 08/03/2018 CLINICAL DATA:  LVAD placement EXAM: PORTABLE CHEST 1 VIEW COMPARISON:  Yesterday FINDINGS: New partially covered LVAD. There is endotracheal tube with tip between the clavicular heads and carina. An orogastric tube reaches the stomach at least. Thoracic drains in place. Cardiomegaly similar to prior when accounting for differences in technique and lower volumes. Swan-Ganz catheter from the right with tip at the main pulmonary artery. No edema, effusion, or pneumothorax. Extensive artifact from EKG leads. IMPRESSION: Expected postoperative chest with hardware described above. Electronically Signed   By: Marnee Spring M.D.   On: 08/03/2018 14:07     Medications:     Scheduled Medications: . acetaminophen  1,000 mg Oral Q6H   Or  . acetaminophen (TYLENOL) oral liquid 160 mg/5 mL  1,000 mg Per Tube Q6H  . aspirin EC  325 mg Oral Daily   Or  . aspirin  324 mg Per Tube Daily   Or  . aspirin  300 mg Rectal Daily  . bisacodyl  10 mg Oral Daily   Or  . bisacodyl  10 mg Rectal Daily  . digoxin  0.125 mg Oral Daily  . docusate sodium  200 mg Oral Daily  . DULoxetine  60 mg Oral Daily  . gabapentin  600 mg Oral TID  . insulin regular  0-10 Units Intravenous TID WC  . mouth rinse  15 mL Mouth Rinse BID  . metoCLOPramide (REGLAN) injection  10 mg Intravenous Q6H  . [START ON 08/05/2018] pantoprazole  40 mg Oral Daily  . sodium chloride flush  3 mL Intravenous Q12H  . thiamine  100 mg Oral Daily    Infusions: . sodium chloride Stopped (08/04/18 0515)  . sodium chloride    . sodium chloride 10 mL/hr at 08/04/18 0401  . albumin human 12.5 g (08/03/18 2051)  . cefUROXime (ZINACEF)  IV Stopped (08/04/18 0645)  . dexmedetomidine (PRECEDEX) IV infusion 0.7 mcg/kg/hr (08/04/18 0700)  . epinephrine 0.5 mcg/min (08/04/18 0700)  . famotidine  (PEPCID) IV Stopped (08/03/18 2256)  . insulin (NOVOLIN-R) infusion 3.4 mL/hr at  08/04/18 0700  . lactated ringers 10 mL/hr at 08/04/18 0700  . lactated ringers 20 mL/hr at 08/04/18 0700  . milrinone 0.375 mcg/kg/min (08/04/18 0700)  . norepinephrine (LEVOPHED) Adult infusion 1 mcg/min (08/04/18 0700)  . vancomycin 1,000 mg (08/04/18 0917)  . vasopressin (PITRESSIN) infusion - *FOR SHOCK* Stopped (08/03/18 1541)    PRN Medications: sodium chloride, albumin human, midazolam, morphine injection, nicotine, ondansetron (ZOFRAN) IV, oxyCODONE, sodium chloride flush, traMADol    Patient Profile   Theodore Welch is a 57 y.o. 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  Admitted with worsening SOB and orthopnea in setting of severe biventricular CHF.   Assessment/Plan   1. Acute on chronic systolic CHF:  Nonischemic cardiomyopathy based on cath 7/19 in [place]. Remote heavy ETOH, says he has quit drinking completely for about 2 months now. HIV negative.  No cocaine/amphetamines.  Echo was done this admission with biventricular failure, LV EF 10-20% with dilated RV. There is moderate to severe MR and severe TR with poor coaptation of TV leaflets. On exam, he was markedly volume overloaded initially.  Low output HF indicated by low Co-ox of 43% initially.  cMRI 07/23/18 LVEF EF 14%, Severe RF dysfunction (EF 25%) with nonspecific RV insertion site LGE pattern likely from pressure/volume overload. RHC showed good cardiac output on milrinone with filling pressures nearing normal.  PAPi 2.65 suggested that RV function is better than expected from imaging.  Echo on 9/10 after diuresis showed EF 20-25%, RV looked better, mild to moderate MR, moderate TR.  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 CO-OX 73% today, good cardiac index from [place].  Now off norepinephrine, continues on low dose epi 0.5 and milrinone 0.375.  Weight up, CVP around 10.  - Start  Lasix 80 mg IV bid.  - on 325 mg asa.  - Timing of heparin drip/warfarin per CT surgery.  - INR 1.14  2. OSA:  In the community on CPAP.    3. COPD:  He had mostly quit smoking before admission but still smoked a few cigarettes/week.  - PFTs 07/24/18 FVC 3.19 (79%), FEV1 2.47 (78%), DLCO 53%, TLC 82% - No change to current plan.   4. 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 ?component of ETOH cirrhosis given prior heavy ETOH (has now quit). RUQ US showed ascites with evidence for cirrhosis. NH3 with initial elevation at 64 on 9/3. CT abdomen showed resolution of ascites but there was evidence for cirrhosis.  -  INR 1.31 07/25/18, adequate liver synthetic function.  - Sent viral hepatitis labs => Negative.  - Total bili 1.6 07/29/18. Improved with decongestion (Improved from 3.5 on 07/22/18).  - NH3 down to 23 07/28/18 - No change to current plan.   5. RLE DVT:  He has a right peroneal vein (below knee) DVT.  Even though below knee, think he is a relatively high risk patient with severe cardiomyopathy.  He will be on anticoagulation for LVAD.   6. Valvular heart disease: - Moderate to severe MR and severe TR on initial echo.  Suspect this is functional due to annular dilation.  Repeat echo after diuresis on 9/10 showed mild-moderate MR, moderate TR.   - No change to current plan.   7. R groin hematoma - Post cath, by [name] 07/30/18 - Improved with compression  Tonye Becket, NP  08/04/2018 9:26 AM  Advanced Heart Failure Team Pager [phone] (M-F; 7a - 4p)  Please  contact [facility] for night-coverage after hours (4p -7a ) and weekends on [email or web address]  Patient seen with NP, agree with the above note.   HM3 LVAD placed yesterday, now extubated and on low dose epinephrine 0.5 and milrinone 0.375.  Good CO by co-ox and thermodilution.  CVP 10 with significant weight-gain post-op.  Awake/alert, no complaints.  Renal function stable.  MAP 90s now.   On exam, normal LVAD sounds.  JVP  9-10 cm.  No edema.  Decreased BS at bases.   LVAD parameters assessed and stable.   Mild volume overload, will start Lasix 40 mg IV bid and follow response.  Renal function stable.   Tbili 2.5, continue to follow.   Has titrated off norepinephrine.  Next step will be to come off epinephrine.  Continue milrinone for now.   Initiation of heparin gtt/warfarin per TCTS.    CRITICAL CARE Performed by: Marca Ancona  Total critical care time: 35 minutes  Critical care time was exclusive of separately billable procedures and treating other patients.  Critical care was necessary to treat or prevent imminent or life-threatening deterioration.  Critical care was time spent personally by me on the following activities: development of treatment plan with patient and/or surrogate as well as nursing, discussions with consultants, evaluation of patient's response to treatment, examination of patient, obtaining history from patient or surrogate, ordering and performing treatments and interventions, ordering and review of laboratory studies, ordering and review of radiographic studies, pulse oximetry and re-evaluation of patient's condition.  Marca Ancona 08/04/2018 11:15 AM

## 2018-08-04 NOTE — Progress Notes (Signed)
Nutrition Follow-up  DOCUMENTATION CODES:   Non-severe (moderate) malnutrition in context of chronic illness  INTERVENTION:   Ensure Enlive po BID, each supplement provides 350 kcal and 20 grams of protein   NUTRITION DIAGNOSIS:   Moderate Malnutrition related to chronic illness(CHF, cirrhosis) as evidenced by moderate fat depletion, moderate muscle depletion.  GOAL:   Patient will meet greater than or equal to 90% of their needs  Progressing  MONITOR:   PO intake, Supplement acceptance, Labs, Weight trends  REASON FOR ASSESSMENT:   Consult LVAD Eval  ASSESSMENT:   57 y/o male PMHx DM2, HTN/HLD, OSA on CPAP, CHF, Cirrhosis, ongoing Tobacco abuse, past etoh abuse (3 mo ago). Diagnosed w/ NICM 04/2018. Has had worsening edema despite titration of diuretics. Presented w/ 2-3 hx of acute increase of BLE edema, Orthopnea and PND. Admitted for AoC HF. With worsening HF, LVAD process started.   9/16 LVAD placed, destination therapy 9/17 Extubated  Diet advanced to Heart Healthy Carb Mod today; pt ate 100% at lunch. Recorded po intake 90--100% of meals prior to procedure Pt drinking Ensure Enlive well prior to LVAD, will reorder today  Weight up post-op; current wt 71.5 kg; weight of 62.6 kg prior to procedure; pt weighed 67.7 kg on admission.   Labs: reviewed Meds: lasix, reglan  Diet Order:   Diet Order            Diet heart healthy/carb modified Room service appropriate? Yes; Fluid consistency: Thin  Diet effective now              EDUCATION NEEDS:   Education needs have been addressed  Skin:  Skin Assessment: Skin Integrity Issues: Skin Integrity Issues:: Incisions Incisions: chest: sternotomy  Last BM:  9/15  Height:   Ht Readings from Last 1 Encounters:  07/21/18 5\' 10"  (1.778 m)    Weight:   Wt Readings from Last 1 Encounters:  08/04/18 71.5 kg    Ideal Body Weight:  75.45 kg  BMI:  Body mass index is 22.62 kg/m.  Estimated Nutritional  Needs:   Kcal:  2100-2300 kcals   Protein:  105-120 g  Fluid:  1.5 L   Romelle Starcher MS, RD, LDN, CNSC [phone] Pager  [phone] Weekend/On-Call Pager

## 2018-08-04 NOTE — Progress Notes (Signed)
Initiated Open Heart Rapid Wean per Protocol 

## 2018-08-04 NOTE — Progress Notes (Signed)
CSW spoke with VAD Coordinator who stated patient did well during surgery and was transferred to ICU. CSW visited bedside although no family present at the time of visit. Patient intubated and sedated. CSW will follow up tomorrow with bedside visit. Lasandra Beech, LCSW, CCSW-MCS [phone]

## 2018-08-04 NOTE — Progress Notes (Signed)
LVAD Coordinator Rounding Note:  Admitted 07/21/18 due to worsening of LE edema, orthopnea and PND over the past 2-3 days. Dr. Shirlee Latch consulted.   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 Laneta Simmers under DT criteria due to current smoker and positive THC.  Pt was sitting up in bed on my arrival this morning. Pt was extubated around 0900. He is talkative and asking questions about his drive line and his controller.   Vital signs: Tmax: 97.9 HR: 77 Doppler Pressure: 92 Arterial BP: 81/72(77) O2 Sat: 100% on 4 L/[place] Wt:  157.6 lbs   LVAD interrogation reveals:   Speed: 5400 Flow: 4.3 Power:  3.8 PI: 3.4  Alarms: none Events:  1 PI overnight Hematocrit: 26.6 Fixed speed: 5400 Low speed limit: 5100   Drive Line: Daily dressing w/silver strip. Existing VAD dressing removed and site care performed using sterile technique. Drive line exit site cleaned with Chlora prep applicators x 2, allowed to dry, and gauze dressing with silver strip re-applied. Exit site healing and unincorporated, the velour is fully implanted at exit site. 1 suture intact. Small amount of bloody drainage. No redness, tenderness, foul odor or rash noted. Drive line anchor re-applied.  Next dressing change due 08/05/18.    Labs:  LDH trend: 240  INR trend: 1.14  Anticoagulation Plan: -INR Goal: 2-2.5 -ASA Dose: 325 mg until INR therapeutiv  Blood Products:  -intra op: 08/03/18 3 u FFP  Arrythmias:   Respiratory: Pt extubated around 0900 am. NO off.  Infection:   Renal:  -BUN/CRT: 14/0.62  Gtts: Milrinone 0.375 mcg/kg/min Levophed 1 mcg/min Epi 0.5 mcg/min   Adverse Events on VAD:  VAD Education: 1. Pt educated this morning on the need to hat/mask during drive line dressing change.  2. Pt educated on controller and the need for 2 batteries.  Plan/Recommendations:   1. VAD Coordinator or nurse champion to change drive line dressings. Next dressing change due 08/05/18. 2. Continue to mobilize pt.    3. Call VAD coordinator for any equipment issues or drive line concerns.   [name] RN, BSN VAD Coordinator 24/7 Pager [phone]

## 2018-08-05 ENCOUNTER — Inpatient Hospital Stay: Payer: Self-pay

## 2018-08-05 ENCOUNTER — Inpatient Hospital Stay (HOSPITAL_COMMUNITY): Payer: Medicare Other

## 2018-08-05 LAB — CBC WITH DIFFERENTIAL/PLATELET
ABS IMMATURE GRANULOCYTES: 0.1 10*3/uL (ref 0.0–0.1)
Abs Immature Granulocytes: 0.1 10*3/uL (ref 0.0–0.1)
BASOS ABS: 0.1 10*3/uL (ref 0.0–0.1)
Basophils Absolute: 0.1 10*3/uL (ref 0.0–0.1)
Basophils Relative: 0 %
Basophils Relative: 0 %
EOS ABS: 0.1 10*3/uL (ref 0.0–0.7)
Eosinophils Absolute: 0.8 10*3/uL — ABNORMAL HIGH (ref 0.0–0.7)
Eosinophils Relative: 5 %
Eosinophils Relative: 1 %
HCT: 26.3 % — ABNORMAL LOW (ref 39.0–52.0)
HEMATOCRIT: 25.7 % — AB (ref 39.0–52.0)
HEMOGLOBIN: 8.5 g/dL — AB (ref 13.0–17.0)
HEMOGLOBIN: 8.3 g/dL — AB (ref 13.0–17.0)
IMMATURE GRANULOCYTES: 0 %
IMMATURE GRANULOCYTES: 0 %
LYMPHS ABS: 1.2 10*3/uL (ref 0.7–4.0)
LYMPHS PCT: 9 %
Lymphocytes Relative: 8 %
Lymphs Abs: 1.3 10*3/uL (ref 0.7–4.0)
MCH: 28.8 pg (ref 26.0–34.0)
MCH: 28.4 pg (ref 26.0–34.0)
MCHC: 32.3 g/dL (ref 30.0–36.0)
MCHC: 32.3 g/dL (ref 30.0–36.0)
MCV: 89.2 fL (ref 78.0–100.0)
MCV: 88 fL (ref 78.0–100.0)
MONOS PCT: 19 %
Monocytes Absolute: 3 10*3/uL — ABNORMAL HIGH (ref 0.1–1.0)
Monocytes Absolute: 2.9 10*3/uL — ABNORMAL HIGH (ref 0.1–1.0)
Monocytes Relative: 20 %
NEUTROS ABS: 9.9 10*3/uL — AB (ref 1.7–7.7)
NEUTROS PCT: 72 %
Neutro Abs: 11.3 10*3/uL — ABNORMAL HIGH (ref 1.7–7.7)
Neutrophils Relative %: 66 %
Platelets: 146 10*3/uL — ABNORMAL LOW (ref 150–400)
Platelets: 131 10*3/uL — ABNORMAL LOW (ref 150–400)
RBC: 2.95 MIL/uL — AB (ref 4.22–5.81)
RBC: 2.92 MIL/uL — AB (ref 4.22–5.81)
RDW: 15.9 % — ABNORMAL HIGH (ref 11.5–15.5)
RDW: 15.9 % — AB (ref 11.5–15.5)
WBC: 15.2 10*3/uL — AB (ref 4.0–10.5)
WBC: 15.7 10*3/uL — AB (ref 4.0–10.5)

## 2018-08-05 LAB — BLOOD GAS, ARTERIAL
ACID-BASE EXCESS: 3.4 mmol/L — AB (ref 0.0–2.0)
BICARBONATE: 26.9 mmol/L (ref 20.0–28.0)
O2 CONTENT: 1 L/min
O2 SAT: 94.2 %
PCO2 ART: 37.2 mmHg (ref 32.0–48.0)
PH ART: 7.473 — AB (ref 7.350–7.450)
PO2 ART: 67 mmHg — AB (ref 83.0–108.0)
Patient temperature: 98.6

## 2018-08-05 LAB — PROTIME-INR
INR: 1.28
PROTHROMBIN TIME: 15.9 s — AB (ref 11.4–15.2)

## 2018-08-05 LAB — PHOSPHORUS: PHOSPHORUS: 3.4 mg/dL (ref 2.5–4.6)

## 2018-08-05 LAB — COMPREHENSIVE METABOLIC PANEL
ALBUMIN: 3.7 g/dL (ref 3.5–5.0)
ALT: 20 U/L (ref 0–44)
AST: 55 U/L — AB (ref 15–41)
Alkaline Phosphatase: 67 U/L (ref 38–126)
Anion gap: 10 (ref 5–15)
BILIRUBIN TOTAL: 2.3 mg/dL — AB (ref 0.3–1.2)
BUN: 13 mg/dL (ref 6–20)
CO2: 26 mmol/L (ref 22–32)
Calcium: 9.8 mg/dL (ref 8.9–10.3)
Chloride: 101 mmol/L (ref 98–111)
Creatinine, Ser: 0.51 mg/dL — ABNORMAL LOW (ref 0.61–1.24)
GFR calc Af Amer: >60 mL/min (ref >60)
GLUCOSE: 109 mg/dL — AB (ref 70–99)
POTASSIUM: 4.6 mmol/L (ref 3.5–5.1)
Sodium: 137 mmol/L (ref 135–145)
TOTAL PROTEIN: 5.9 g/dL — AB (ref 6.5–8.1)

## 2018-08-05 LAB — LACTATE DEHYDROGENASE: LDH: 263 U/L — AB (ref 98–192)

## 2018-08-05 LAB — MAGNESIUM
MAGNESIUM: 0.9 mg/dL — AB (ref 1.7–2.4)
MAGNESIUM: 2.1 mg/dL (ref 1.7–2.4)

## 2018-08-05 LAB — COOXEMETRY PANEL
Carboxyhemoglobin: 2.1 % — ABNORMAL HIGH (ref 0.5–1.5)
Methemoglobin: 1.8 % — ABNORMAL HIGH (ref 0.0–1.5)
O2 SAT: 66.9 %
Total hemoglobin: 8.4 g/dL — ABNORMAL LOW (ref 12.0–16.0)

## 2018-08-05 LAB — GLUCOSE, CAPILLARY
GLUCOSE-CAPILLARY: 116 mg/dL — AB (ref 70–99)
GLUCOSE-CAPILLARY: 93 mg/dL (ref 70–99)
Glucose-Capillary: 106 mg/dL — ABNORMAL HIGH (ref 70–99)
Glucose-Capillary: 148 mg/dL — ABNORMAL HIGH (ref 70–99)
Glucose-Capillary: 131 mg/dL — ABNORMAL HIGH (ref 70–99)

## 2018-08-05 MED ORDER — SODIUM CHLORIDE 0.9% FLUSH
10.0000 mL | Freq: Two times a day (BID) | INTRAVENOUS | Status: DC
  Administered 2018-08-05 – 2018-08-06 (×3): 10 mL
  Administered 2018-08-07: 20 mL
  Administered 2018-08-07: 10 mL

## 2018-08-05 MED ORDER — ALBUMIN HUMAN 5 % IV SOLN
12.5000 g | Freq: Once | INTRAVENOUS | Status: AC
  Administered 2018-08-05: 12.5 g via INTRAVENOUS
  Filled 2018-08-05: qty 250

## 2018-08-05 MED ORDER — POTASSIUM CHLORIDE 10 MEQ/100ML IV SOLN
10.0000 meq | INTRAVENOUS | Status: AC
  Administered 2018-08-05 (×3): 10 meq via INTRAVENOUS
  Filled 2018-08-05 (×3): qty 100

## 2018-08-05 MED ORDER — SODIUM CHLORIDE 0.9% FLUSH: 10.0000 mL | INTRAVENOUS | Status: DC | PRN

## 2018-08-05 MED ORDER — ALBUMIN HUMAN 5 % IV SOLN
INTRAVENOUS | Status: AC
  Administered 2018-08-05: 12.5 g via INTRAVENOUS
  Filled 2018-08-05: qty 250

## 2018-08-05 MED ORDER — INSULIN ASPART 100 UNIT/ML ~~LOC~~ SOLN
0.0000 [IU] | Freq: Three times a day (TID) | SUBCUTANEOUS | Status: DC
  Administered 2018-08-05 (×2): 2 [IU] via SUBCUTANEOUS
  Administered 2018-08-06: 8 [IU] via SUBCUTANEOUS
  Administered 2018-08-08 – 2018-08-11 (×2): 2 [IU] via SUBCUTANEOUS

## 2018-08-05 MED ORDER — MILRINONE LACTATE IN DEXTROSE 20-5 MG/100ML-% IV SOLN
0.1250 ug/kg/min | INTRAVENOUS | Status: DC
  Administered 2018-08-05 – 2018-08-06 (×2): 0.25 ug/kg/min via INTRAVENOUS
  Filled 2018-08-05 (×2): qty 100

## 2018-08-05 MED ORDER — ALBUMIN HUMAN 5 % IV SOLN
12.5000 g | Freq: Once | INTRAVENOUS | Status: AC
  Administered 2018-08-05: 12.5 g via INTRAVENOUS

## 2018-08-05 MED ORDER — TRAMADOL HCL 50 MG PO TABS
50.0000 mg | ORAL_TABLET | ORAL | Status: DC | PRN
  Administered 2018-08-07 – 2018-08-08 (×2): 50 mg via ORAL
  Filled 2018-08-05 (×2): qty 1

## 2018-08-05 MED ORDER — CHLORHEXIDINE GLUCONATE CLOTH 2 % EX PADS
6.0000 | MEDICATED_PAD | Freq: Every day | CUTANEOUS | Status: DC
  Administered 2018-08-05 – 2018-08-06 (×2): 6 via TOPICAL

## 2018-08-05 MED ORDER — MAGNESIUM SULFATE 2 GM/50ML IV SOLN
2.0000 g | Freq: Once | INTRAVENOUS | Status: AC
  Administered 2018-08-05: 2 g via INTRAVENOUS
  Filled 2018-08-05: qty 50

## 2018-08-05 MED ORDER — NOREPINEPHRINE 4 MG/250ML-% IV SOLN
0.0000 ug/min | INTRAVENOUS | Status: DC
  Administered 2018-08-05: 2 ug/min via INTRAVENOUS
  Filled 2018-08-05: qty 250

## 2018-08-05 MED ORDER — WARFARIN SODIUM 5 MG PO TABS
5.0000 mg | ORAL_TABLET | Freq: Once | ORAL | Status: AC
  Administered 2018-08-05: 5 mg via ORAL
  Filled 2018-08-05: qty 1

## 2018-08-05 MED FILL — Heparin Sodium (Porcine) Inj 1000 Unit/ML: INTRAMUSCULAR | Qty: 20 | Status: AC

## 2018-08-05 MED FILL — Potassium Chloride Inj 2 mEq/ML: INTRAVENOUS | Qty: 40 | Status: AC

## 2018-08-05 MED FILL — Electrolyte-R (PH 7.4) Solution: INTRAVENOUS | Qty: 4000 | Status: AC

## 2018-08-05 MED FILL — Sodium Chloride IV Soln 0.9%: INTRAVENOUS | Qty: 2000 | Status: AC

## 2018-08-05 MED FILL — Heparin Sodium (Porcine) Inj 1000 Unit/ML: INTRAMUSCULAR | Qty: 30 | Status: AC

## 2018-08-05 MED FILL — Sodium Bicarbonate IV Soln 8.4%: INTRAVENOUS | Qty: 50 | Status: AC

## 2018-08-05 MED FILL — Mannitol IV Soln 20%: INTRAVENOUS | Qty: 500 | Status: AC

## 2018-08-05 MED FILL — Magnesium Sulfate Inj 50%: INTRAMUSCULAR | Qty: 10 | Status: AC

## 2018-08-05 NOTE — Progress Notes (Signed)
CT surgery p.m. Rounds  Patient did well today, ambulating in hallway Epinephrine almost weaned off O2 sat 95% on room air CVP low Urine output adequate Coumadin 5 mg this p.m.

## 2018-08-05 NOTE — Progress Notes (Signed)
Patient ID: Theodore Welch, male   DOB: 25-Apr-1961, 57 y.o.   MRN: [id number]     Advanced Heart Failure Rounding Note  PCP-Cardiologist: No primary care provider on file.   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 CO-OX 67%. Aggressive diuresis yesterday, net negative 1720 cc.  Low Mg, had run of NSVT and multiple PI events.  CVP 12. MAP 80.   Up to chair yesterday.  Did not sleep well, otherwise feels ok.   LDH 240 => 263, creatinine 0.62 => 0.5, tbili 2.5 => 2.3, hgb 8.3 => 8.5, WBCs 12.7 => 15.   HMIII Speed 5400 Flow 4.3 Power 3.8 PI 4.0.  15 PI events.    Objective:    Weight Range: 71.9 kg Body mass index is 22.74 kg/m.   Vital Signs:   Temp:  [97.3 F (36.3 C)-99.4 F (37.4 C)] 99.4 F (37.4 C) (09/18 0400) Pulse Rate:  [32-117] 78 (09/18 0815) Resp:  [17-33] 31 (09/18 0815) BP: (68-120)/(47-102) 99/84 (09/18 0800) SpO2:  [89 %-100 %] 100 % (09/18 0815) Arterial Line BP: (79-125)/(49-84) 104/70 (09/18 0815) Weight:  [71.9 kg] 71.9 kg (09/18 0500) Last BM Date: 08/02/18  Weight change: Filed Weights   08/03/18 0403 08/04/18 0424 08/05/18 0500  Weight: 62.6 kg 71.5 kg 71.9 kg   Intake/Output:   Intake/Output Summary (Last 24 hours) at 08/05/2018 0902 Last data filed at 08/05/2018 0800 Gross per 24 hour  Intake 3769.25 ml  Output 5580 ml  Net -1810.75 ml    Physical Exam   CVP 10 Physical Exam: General: Well appearing this am. NAD.  HEENT: Normal. Neck: Supple, JVP 8 cm. Carotids OK.  Cardiac:  Mechanical heart sounds with LVAD hum present.  Lungs:  CTAB, normal effort.  Abdomen:  NT, ND, no HSM. No bruits or masses. +BS  LVAD exit site: Well-healed and incorporated. Dressing dry and intact. No erythema or drainage. Stabilization device present and accurately applied. Driveline dressing changed daily per sterile technique. Extremities:  Warm and dry. No cyanosis, clubbing,  rash, or edema.  Neuro:  Alert & oriented x 3. Cranial nerves grossly intact. Moves all 4 extremities w/o difficulty. Affect pleasant    Telemetry   Sinus tachy 100s, had episode of VT overnight. Personally checked.   EKG    No new tracings.    Labs    CBC Recent Labs    08/05/18 0252 08/05/18 0519  WBC 15.2* 15.7*  NEUTROABS 9.9* 11.3*  HGB 8.5* 8.3*  HCT 26.3* 25.7*  MCV 89.2 88.0  PLT 146* 131*   Basic Metabolic Panel Recent Labs    93/73/42 0311  08/04/18 1622 08/05/18 0252 08/05/18 0746  NA 141  --  138 137  --   K 4.5   < > 3.9 4.6  --   CL 107  --  101 101  --   CO2 27  --   --  26  --   GLUCOSE 103*  --  116* 109*  --   BUN 14  --  12 13  --   CREATININE 0.62   < > 0.50* 0.51*  --   CALCIUM 9.8  --   --  9.8  --   MG 2.3   < >  --  0.9* 2.1  PHOS 4.4  --   --  3.4  --    < > =  values in this interval not displayed.   Liver Function Tests Recent Labs    08/04/18 0311 08/05/18 0252  AST 64* 55*  ALT 18 20  ALKPHOS 66 67  BILITOT 2.5* 2.3*  PROT 6.1* 5.9*  ALBUMIN 3.6 3.7   No results for input(s): LIPASE, AMYLASE in the last 72 hours. Cardiac Enzymes No results for input(s): CKTOTAL, CKMB, CKMBINDEX, TROPONINI in the last 72 hours.  BNP: BNP (last 3 results) Recent Labs    07/21/18 0540 08/04/18 0902  BNP 1,343.9* 658.6*    ProBNP (last 3 results) No results for input(s): PROBNP in the last 8760 hours.   D-Dimer Recent Labs    08/03/18 1317  DDIMER 1.62*   Hemoglobin A1C Recent Labs    08/03/18 0413  HGBA1C 6.2*   Fasting Lipid Panel No results for input(s): CHOL, HDL, LDLCALC, TRIG, CHOLHDL, LDLDIRECT in the last 72 hours. Thyroid Function Tests No results for input(s): TSH, T4TOTAL, T3FREE, THYROIDAB in the last 72 hours.  Invalid input(s): FREET3  Other results:   Imaging    No results found.   Medications:     Scheduled Medications: . acetaminophen  1,000 mg Oral Q6H   Or  . acetaminophen (TYLENOL)  oral liquid 160 mg/5 mL  1,000 mg Per Tube Q6H  . aspirin EC  325 mg Oral Daily   Or  . aspirin  324 mg Per Tube Daily   Or  . aspirin  300 mg Rectal Daily  . bisacodyl  10 mg Oral Daily   Or  . bisacodyl  10 mg Rectal Daily  . digoxin  0.125 mg Oral Daily  . docusate sodium  200 mg Oral Daily  . DULoxetine  60 mg Oral Daily  . enoxaparin (LOVENOX) injection  40 mg Subcutaneous QHS  . feeding supplement (ENSURE ENLIVE)  237 mL Oral BID BM  . gabapentin  600 mg Oral TID  . insulin aspart  0-24 Units Subcutaneous Q4H  . insulin detemir  10 Units Subcutaneous Daily  . insulin regular  0-10 Units Intravenous TID WC  . mouth rinse  15 mL Mouth Rinse BID  . metoCLOPramide (REGLAN) injection  10 mg Intravenous Q6H  . pantoprazole  40 mg Oral Daily  . sodium chloride flush  3 mL Intravenous Q12H  . thiamine  100 mg Oral Daily  . Warfarin - Physician Dosing Inpatient   Does not apply q1800    Infusions: . sodium chloride Stopped (08/04/18 1055)  . sodium chloride    . sodium chloride 10 mL/hr at 08/05/18 0700  . dexmedetomidine (PRECEDEX) IV infusion Stopped (08/04/18 0900)  . epinephrine 0.5 mcg/min (08/05/18 0800)  . insulin (NOVOLIN-R) infusion Stopped (08/04/18 1133)  . lactated ringers Stopped (08/04/18 0715)  . lactated ringers 10 mL/hr at 08/05/18 0800  . milrinone    . norepinephrine (LEVOPHED) Adult infusion 2 mcg/min (08/05/18 0700)    PRN Medications: sodium chloride, hydrALAZINE, midazolam, morphine injection, nicotine, ondansetron (ZOFRAN) IV, oxyCODONE, sodium chloride flush, traMADol    Patient Profile   Theodore Welch is a 57 y.o. 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  Admitted with worsening SOB and orthopnea in setting of severe biventricular CHF.   Assessment/Plan   1. Acute on chronic systolic CHF:  Nonischemic cardiomyopathy based on cath 7/19 in [place]. Remote heavy ETOH,  says he has quit drinking completely for about 2 months now. HIV negative.  No cocaine/amphetamines.  Echo was done this admission with biventricular failure, LV EF 10-20% with dilated RV. There is moderate to severe MR and severe TR with poor coaptation of TV leaflets. On exam, he was markedly volume overloaded initially.  Low output HF indicated by low Co-ox of 43% initially.  cMRI 07/23/18 LVEF EF 14%, Severe RF dysfunction (EF 25%) with nonspecific RV insertion site LGE pattern likely from pressure/volume overload. RHC showed good cardiac output on milrinone with filling pressures nearing normal.  PAPi 2.65 suggested that RV function is better than expected from imaging.  Echo on 9/10 after diuresis showed EF 20-25%, RV looked better, mild to moderate MR, moderate TR. 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 CO-OX 67% today, CVP 10.  Had brisk diuresis yesterday with low Mg, PI events, and run of VT.  Currently on epinephrine 0.5, norepinephrine 1, milrinone 0.375.  MAP 80. Swan out.  - Hold diuretic for now.  - Decrease milrinone to 0.25 today.  Can likely stop norepinephrine this morning as well.  - 325 mg ASA until INR 2.  - Coumadin started.   2. OSA: In the community on CPAP.    3. COPD:  He had mostly quit smoking before admission but still smoked a few cigarettes/week. PFTs 07/24/18 FVC 3.19 (79%), FEV1 2.47 (78%), DLCO 53%, TLC 82%.  - No change to current plan.   4. 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 ?component of ETOH cirrhosis given prior heavy ETOH (has now quit). RUQ US showed ascites with evidence for cirrhosis. NH3 with initial elevation at 64 on 9/3. CT abdomen showed resolution of ascites but there was evidence for cirrhosis. Tbili 2.3 today.    5. RLE DVT:  He has a right peroneal vein (below knee) DVT.  Even though below knee, think he is a relatively high risk patient with severe cardiomyopathy.  He will be on anticoagulation for LVAD.   6. Valvular heart disease:  Moderate  to severe MR and severe TR on initial echo.  Suspect this was functional due to annular dilation.  Repeat echo after diuresis on 9/10 showed mild-moderate MR, moderate TR.    7. VT: Run overnight 9/17 in setting of hypomagnesemia and aggressive diuresis.  Replacing Mg, backing off on diuresis.   Mobilize today.   CRITICAL CARE Performed by: Marca Ancona  Total critical care time: 35 minutes  Critical care time was exclusive of separately billable procedures and treating other patients.  Critical care was necessary to treat or prevent imminent or life-threatening deterioration.  Critical care was time spent personally by me on the following activities: development of treatment plan with patient and/or surrogate as well as nursing, discussions with consultants, evaluation of patient's response to treatment, examination of patient, obtaining history from patient or surrogate, ordering and performing treatments and interventions, ordering and review of laboratory studies, ordering and review of radiographic studies, pulse oximetry and re-evaluation of patient's condition.  Marca Ancona, MD  08/05/2018 9:02 AM

## 2018-08-05 NOTE — Progress Notes (Signed)
LVAD Coordinator Rounding Note:  Admitted 07/21/18 due to worsening of LE edema, orthopnea and PND over the past 2-3 days. Dr. Shirlee Latch consulted.   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 Laneta Simmers under DT criteria due to current smoker and positive THC.  Pt lying in bed this am, c/o being very tired. Reports he was awake "all night" with multiple staff members in room. Dr. Shirlee Latch and Dr. Laneta Simmers at bedside assessing patient.    Vital signs: Tmax: 99.8 HR: 112 Doppler Pressure:   Arterial BP: 89/67 (74) O2: 98% on RA Wt: 138.0>157.6>158.5 lbs   LVAD interrogation reveals:   Speed: 5400 Flow: 4.3 Power:  3.8 PI: 3.9  Alarms: none Events:  11 PI events 9/17 Hematocrit: 26 Fixed speed: 5400 Low speed limit: 5100   Drive Line: daily dressing with silver strip and gauze dressing. VAD coordinator, Nurse Alla Feeling, or trained caregiver to perform dressing changes.    Labs:  LDH trend: 240>263  INR trend: 1.14>1.28  Anticoagulation Plan: -INR Goal: 2-2.5 -ASA Dose: 325 mg until INR therapeutiv  Blood Products:  -intra op: 08/03/18 3 u FFP  Arrythmias:   Respiratory: Pt extubated 08/05/18  Gtts: Milrinone 0.25 mcg/kg/min Epi 0.5 mcg/min   Adverse Events on VAD:  VAD Education: 1. Caregiver will observe dressing change today. 2. Delivered Patient VAD Discharge Binder to room for patient and caregiver review. Caregiver has begun reading HM III Patient Handbook per bedside nurse.   Plan/Recommendations:   1. VAD Coordinator or nurse champion to change drive line dressings.  2. Continue to mobilize pt.  3. Call VAD coordinator for any equipment issues or drive line concerns.   Hessie Diener RN, BSN VAD Coordinator 24/7 Pager [phone]

## 2018-08-05 NOTE — Progress Notes (Addendum)
Patient ID: Theodore Welch, male   DOB: Jan 16, 1961, 57 y.o.   MRN: [id number] HeartMate 3 Rounding Note  Subjective:    Had multiple PI events overnight and some NSVT after large diuresis yesterday. Gave him some albumin and this seemed to improve that. Put back on low dose levophed to support MAP.  Feels tired because he did not sleep much Passing flatus.  MAP 80 CVP 10 Epi 0.5, milrinone 0.375, NE 2  LVAD INTERROGATION:  HeartMate IIl LVAD:  Flow 4.1 liters/min, speed 5400, power 3.9, PI 3.3.    Objective:    Vital Signs:   Temp:  [97.3 F (36.3 C)-99.4 F (37.4 C)] 99.4 F (37.4 C) (09/18 0400) Pulse Rate:  [32-117] 78 (09/18 0815) Resp:  [17-33] 31 (09/18 0815) BP: (68-120)/(47-102) 99/84 (09/18 0800) SpO2:  [89 %-100 %] 100 % (09/18 0815) Arterial Line BP: (79-125)/(49-84) 104/70 (09/18 0815) Weight:  [71.9 kg] 71.9 kg (09/18 0500) Last BM Date: 08/02/18 Mean arterial Pressure 80  Intake/Output:   Intake/Output Summary (Last 24 hours) at 08/05/2018 0903 Last data filed at 08/05/2018 0800 Gross per 24 hour  Intake 3769.25 ml  Output 5580 ml  Net -1810.75 ml     Physical Exam: General:  Well appearing but tired because he did not sleep. No resp difficulty HEENT: normal Cor: occasional heart sounds with LVAD hum present. Lungs: clear Chest dressing dry Abdomen: soft, nontender, nondistended. No hepatosplenomegaly. No bruits or masses. Good bowel sounds. Extremities: no cyanosis, clubbing, rash, edema Neuro: alert & orientedx3, cranial nerves grossly intact. moves all 4 extremities w/o difficulty. Affect pleasant  Telemetry: sinus 80's  Labs: Basic Metabolic Panel: Recent Labs  Lab 08/02/18 0407 08/03/18 0412  08/03/18 1317 08/03/18 1329 08/03/18 1920 08/03/18 1926 08/04/18 0311 08/04/18 1612 08/04/18 1622 08/05/18 0252 08/05/18 0746  NA 138 137   < > 142 140  --  139 141  --  138 137  --   K 4.6 4.3   < > 4.3 4.3  --  4.2 4.5 3.9 3.9 4.6  --   CL  104 99   < > 105 102  --  103 107  --  101 101  --   CO2 27 28  --  27  --   --   --  27  --   --  26  --   GLUCOSE 140* 102*   < > 109* 111*  --  151* 103*  --  116* 109*  --   BUN 24* 27*   < > 19 20  --  20 14  --  12 13  --   CREATININE 0.96 0.78   < > 0.70 0.70 0.73 0.60* 0.62 0.56* 0.50* 0.51*  --   CALCIUM 9.9 10.1  --  9.0  --   --   --  9.8  --   --  9.8  --   MG  --   --    < > 1.7  --  2.7*  --  2.3 1.8  --  0.9* 2.1  PHOS  --   --   --   --   --   --   --  4.4  --   --  3.4  --    < > = values in this interval not displayed.    Liver Function Tests: Recent Labs  Lab 07/30/18 1049 07/31/18 0628 08/01/18 0500 08/04/18 0311 08/05/18 0252  AST 26 24 26  64* 55*  ALT 20 20 22 18 20   ALKPHOS 105 97 103 66 67  BILITOT 1.5* 1.7* 1.9* 2.5* 2.3*  PROT 6.4* 6.3* 6.5 6.1* 5.9*  ALBUMIN 3.2* 3.2* 3.4* 3.6 3.7   No results for input(s): LIPASE, AMYLASE in the last 168 hours. No results for input(s): AMMONIA in the last 168 hours.  CBC: Recent Labs  Lab 08/03/18 1920  08/04/18 0311 08/04/18 1612 08/04/18 1622 08/05/18 0252 08/05/18 0519  WBC 9.8  --  12.7* 16.1*  --  15.2* 15.7*  NEUTROABS  --   --  9.5*  --   --  9.9* 11.3*  HGB 8.8*   < > 8.3* 9.1* 10.2* 8.5* 8.3*  HCT 27.7*   < > 26.6* 28.7* 30.0* 26.3* 25.7*  MCV 89.4  --  88.7 89.7  --  89.2 88.0  PLT 134*  --  114* 144*  --  146* 131*   < > = values in this interval not displayed.    INR: Recent Labs  Lab 08/02/18 2309 08/03/18 1317 08/04/18 0311 08/05/18 0252  INR 1.03 1.33 1.14 1.28    Other results:  EKG:   Imaging: Dg Chest Port 1 View  Result Date: 08/04/2018 CLINICAL DATA:  LVAD present EXAM: PORTABLE CHEST 1 VIEW COMPARISON:  Yesterday FINDINGS: Endotracheal tube tip between the clavicular heads and carina. There is a Swan-Ganz catheter from the right with tip at the main pulmonary artery. An orogastric tube at least reaches the stomach. Thoracic drains in place without visible pneumothorax.  Stable cardiac enlargement. No edema or effusion. IMPRESSION: Stable chest.  No failure or visible pneumothorax. Electronically Signed   By: Marnee Spring M.D.   On: 08/04/2018 09:16   Dg Chest Port 1 View  Result Date: 08/03/2018 CLINICAL DATA:  LVAD placement EXAM: PORTABLE CHEST 1 VIEW COMPARISON:  Yesterday FINDINGS: New partially covered LVAD. There is endotracheal tube with tip between the clavicular heads and carina. An orogastric tube reaches the stomach at least. Thoracic drains in place. Cardiomegaly similar to prior when accounting for differences in technique and lower volumes. Swan-Ganz catheter from the right with tip at the main pulmonary artery. No edema, effusion, or pneumothorax. Extensive artifact from EKG leads. IMPRESSION: Expected postoperative chest with hardware described above. Electronically Signed   By: Marnee Spring M.D.   On: 08/03/2018 14:07     Medications:     Scheduled Medications: . acetaminophen  1,000 mg Oral Q6H   Or  . acetaminophen (TYLENOL) oral liquid 160 mg/5 mL  1,000 mg Per Tube Q6H  . aspirin EC  325 mg Oral Daily   Or  . aspirin  324 mg Per Tube Daily   Or  . aspirin  300 mg Rectal Daily  . bisacodyl  10 mg Oral Daily   Or  . bisacodyl  10 mg Rectal Daily  . digoxin  0.125 mg Oral Daily  . docusate sodium  200 mg Oral Daily  . DULoxetine  60 mg Oral Daily  . enoxaparin (LOVENOX) injection  40 mg Subcutaneous QHS  . feeding supplement (ENSURE ENLIVE)  237 mL Oral BID BM  . gabapentin  600 mg Oral TID  . insulin aspart  0-24 Units Subcutaneous Q4H  . insulin detemir  10 Units Subcutaneous Daily  . insulin regular  0-10 Units Intravenous TID WC  . mouth rinse  15 mL Mouth Rinse BID  . metoCLOPramide (REGLAN) injection  10 mg Intravenous Q6H  . pantoprazole  40 mg Oral Daily  .  sodium chloride flush  3 mL Intravenous Q12H  . thiamine  100 mg Oral Daily  . Warfarin - Physician Dosing Inpatient   Does not apply q1800    Infusions: .  sodium chloride Stopped (08/04/18 1055)  . sodium chloride    . sodium chloride 10 mL/hr at 08/05/18 0700  . dexmedetomidine (PRECEDEX) IV infusion Stopped (08/04/18 0900)  . epinephrine 0.5 mcg/min (08/05/18 0800)  . insulin (NOVOLIN-R) infusion Stopped (08/04/18 1133)  . lactated ringers Stopped (08/04/18 0715)  . lactated ringers 10 mL/hr at 08/05/18 0800  . milrinone    . norepinephrine (LEVOPHED) Adult infusion 2 mcg/min (08/05/18 0700)    PRN Medications: sodium chloride, hydrALAZINE, midazolam, morphine injection, nicotine, ondansetron (ZOFRAN) IV, oxyCODONE, sodium chloride flush, traMADol   Assessment/Plan/Discussion:     POD 2 s/p Heartmate III for nonischemic cardiomyopathy with EF 10-20% on admission with dilated and hypokinetic RV, moderate to severe MR and severe TR. He tuned up nicely preop with milrinone and diuresis with reduction of CVP to normal, moderate MR and mild to moderate TR.  Hemodynamics look good on milrinone and small dose of epi. Co-ox is 67% with arterial sat 94%. May be able to turn down milrinone but will discuss with Dr. Shirlee Latch. PI events and NSVT overnight probably due to large response to diuresis. Will need to diurese more slowly. Replace K+ and Mg 2+ as needed.   Output from pocket drain and left pleural tube low so can remove later today after he is up some.  Coumadin 5 mg tonight. Continue ASA. Lovenox 40 for DVT prophylaxis until INR rises. He had a right peroneal DVT preop.  Remove arterial line.  DM: glucose under good control. Will stop Levemir and continue SSI AC/HS.  IS, OOB.   I reviewed the LVAD parameters from today, and compared the results to the patient's prior recorded data.  No programming changes were made.  The LVAD is functioning within specified parameters.  The patient performs LVAD self-test daily.  LVAD interrogation was negative for any significant power changes, alarms or PI events/speed drops.  LVAD equipment check  completed and is in good working order.  Back-up equipment present.   LVAD education done on emergency procedures and precautions and reviewed exit site care.  Length of Stay: [address]  [facility] 08/05/2018, 9:03 AM

## 2018-08-05 NOTE — Progress Notes (Signed)
Drive Line: Daily dressing w/silver strip. Existing VAD dressing removed and site care performed using sterile technique. Drive line exit site cleaned with Chlora prep applicators x 2, allowed to dry, and gauze dressing with silver strip re-applied. Exit site healing and unincorporated, the velour is fully implanted at exit site. 1 suture intact. Moderate amount of bloody drainage. Clot dislodged from site during cleaning. No redness, tenderness, foul odor or rash noted. 1 drive line anchor intact; 2nd pulling away from skin. Second drive line anchor re-applied.  Next dressing change due 08/06/18.    VAD Education:  Significant other [name] at beside. Started teaching about dressing change and sterile technique. Discussed the importance of wearing cap/mask during every dressing change. [name] observed dressing change. Talked her through dressing change step by step, twice. Had her practice don/doff of sterile gloves. Reiterated importance of sterile technique through out entire dressing change process. Left 2 pairs of sterile gloves for her to practice with, along with a print out of the step by step process of the dressing change. Answered all questions regarding dressing change at this time.   Alyce Pagan RN VAD Coordinator  Office: [phone]  24/7 Pager: [phone]

## 2018-08-05 NOTE — Progress Notes (Signed)
Peripherally Inserted Central Catheter/Midline Placement  The IV Nurse has discussed with the patient and/or persons authorized to consent for the patient, the purpose of this procedure and the potential benefits and risks involved with this procedure.  The benefits include less needle sticks, lab draws from the catheter, and the patient may be discharged home with the catheter. Risks include, but not limited to, infection, bleeding, blood clot (thrombus formation), and puncture of an artery; nerve damage and irregular heartbeat and possibility to perform a PICC exchange if needed/ordered by physician.  Alternatives to this procedure were also discussed.  Bard Power PICC patient education guide, fact sheet on infection prevention and patient information card has been provided to patient /or left at bedside.    PICC/Midline Placement Documentation  PICC Double Lumen 08/05/18 PICC Right Brachial 39 cm 0 cm (Active)  Indication for Insertion or Continuance of Line Vasoactive infusions 08/05/2018 11:24 AM  Exposed Catheter (cm) 0 cm 08/05/2018 11:24 AM  Site Assessment Clean;Dry;Intact 08/05/2018 11:24 AM  Lumen #1 Status Flushed;Blood return noted 08/05/2018 11:24 AM  Lumen #2 Status Flushed;Blood return noted 08/05/2018 11:24 AM  Dressing Type Transparent 08/05/2018 11:24 AM  Dressing Status Clean;Dry;Intact;Antimicrobial disc in place 08/05/2018 11:24 AM  Dressing Intervention New dressing 08/05/2018 11:24 AM  Dressing Change Due 08/12/18 08/05/2018 11:24 AM       Reginia Forts [place] 08/05/2018, 11:25 AM

## 2018-08-05 NOTE — Progress Notes (Signed)
CSW met with patient's caregiver [name] at bedside. Patient was resting comfortable. Caregiver noted that he did not sleep well last night so was tired today. Caregiver mentioned she is ready to start observing dressing change and has been reading manual. Caregiver appears very supportive and motivated. CSW will continue to follow for support throughout implant hospitalization. Raquel Sarna, [place], [place]

## 2018-08-06 ENCOUNTER — Inpatient Hospital Stay (HOSPITAL_COMMUNITY): Payer: Medicare Other

## 2018-08-06 LAB — GLUCOSE, CAPILLARY
GLUCOSE-CAPILLARY: 117 mg/dL — AB (ref 70–99)
GLUCOSE-CAPILLARY: 203 mg/dL — AB (ref 70–99)
Glucose-Capillary: 111 mg/dL — ABNORMAL HIGH (ref 70–99)
Glucose-Capillary: 76 mg/dL (ref 70–99)
Glucose-Capillary: 121 mg/dL — ABNORMAL HIGH (ref 70–99)

## 2018-08-06 LAB — COMPREHENSIVE METABOLIC PANEL
ALK PHOS: 70 U/L (ref 38–126)
ALT: 17 U/L (ref 0–44)
AST: 30 U/L (ref 15–41)
Albumin: 3.3 g/dL — ABNORMAL LOW (ref 3.5–5.0)
Anion gap: 11 (ref 5–15)
BUN: 10 mg/dL (ref 6–20)
CALCIUM: 9.8 mg/dL (ref 8.9–10.3)
CHLORIDE: 101 mmol/L (ref 98–111)
CO2: 24 mmol/L (ref 22–32)
CREATININE: 0.55 mg/dL — AB (ref 0.61–1.24)
GFR calc Af Amer: >60 mL/min (ref >60)
GFR calc non Af Amer: >60 mL/min (ref >60)
Glucose, Bld: 145 mg/dL — ABNORMAL HIGH (ref 70–99)
Potassium: 4.2 mmol/L (ref 3.5–5.1)
SODIUM: 136 mmol/L (ref 135–145)
Total Bilirubin: 1.9 mg/dL — ABNORMAL HIGH (ref 0.3–1.2)
Total Protein: 6.1 g/dL — ABNORMAL LOW (ref 6.5–8.1)

## 2018-08-06 LAB — BPAM RBC
Blood Product Expiration Date: 201910092359
Blood Product Expiration Date: 201910092359
Blood Product Expiration Date: 201910112359
Blood Product Expiration Date: 201910112359
ISSUE DATE / TIME: 201909131214
Unit Type and Rh: 5100
Unit Type and Rh: 5100
Unit Type and Rh: 5100
Unit Type and Rh: 5100

## 2018-08-06 LAB — TYPE AND SCREEN
ABO/RH(D): O POS
Antibody Screen: NEGATIVE
Unit division: 0
Unit division: 0
Unit division: 0
Unit division: 0

## 2018-08-06 LAB — CBC WITH DIFFERENTIAL/PLATELET
Abs Immature Granulocytes: 0.1 10*3/uL (ref 0.0–0.1)
Basophils Absolute: 0.1 10*3/uL (ref 0.0–0.1)
Basophils Relative: 0 %
EOS ABS: 0.1 10*3/uL (ref 0.0–0.7)
EOS PCT: 1 %
HEMATOCRIT: 25.3 % — AB (ref 39.0–52.0)
Hemoglobin: 8.2 g/dL — ABNORMAL LOW (ref 13.0–17.0)
IMMATURE GRANULOCYTES: 0 %
LYMPHS ABS: 1.4 10*3/uL (ref 0.7–4.0)
Lymphocytes Relative: 9 %
MCH: 28.6 pg (ref 26.0–34.0)
MCHC: 32.4 g/dL (ref 30.0–36.0)
MCV: 88.2 fL (ref 78.0–100.0)
MONO ABS: 2.8 10*3/uL — AB (ref 0.1–1.0)
MONOS PCT: 18 %
Neutro Abs: 11.3 10*3/uL — ABNORMAL HIGH (ref 1.7–7.7)
Neutrophils Relative %: 72 %
Platelets: 140 10*3/uL — ABNORMAL LOW (ref 150–400)
RBC: 2.87 MIL/uL — ABNORMAL LOW (ref 4.22–5.81)
RDW: 16.2 % — AB (ref 11.5–15.5)
WBC: 15.7 10*3/uL — ABNORMAL HIGH (ref 4.0–10.5)

## 2018-08-06 LAB — COOXEMETRY PANEL
Carboxyhemoglobin: 2.7 % — ABNORMAL HIGH (ref 0.5–1.5)
Methemoglobin: 1.8 % — ABNORMAL HIGH (ref 0.0–1.5)
O2 Saturation: 71.7 %
TOTAL HEMOGLOBIN: 8.2 g/dL — AB (ref 12.0–16.0)

## 2018-08-06 LAB — PROTIME-INR
INR: 1.29
Prothrombin Time: 15.9 seconds — ABNORMAL HIGH (ref 11.4–15.2)

## 2018-08-06 LAB — MAGNESIUM: Magnesium: 1.9 mg/dL (ref 1.7–2.4)

## 2018-08-06 LAB — LACTATE DEHYDROGENASE: LDH: 255 U/L — AB (ref 98–192)

## 2018-08-06 LAB — CALCIUM, IONIZED: CALCIUM, IONIZED, SERUM: 4.9 mg/dL (ref 4.5–5.6)

## 2018-08-06 LAB — PHOSPHORUS: Phosphorus: 2.9 mg/dL (ref 2.5–4.6)

## 2018-08-06 MED ORDER — WARFARIN SODIUM 5 MG PO TABS
5.0000 mg | ORAL_TABLET | Freq: Once | ORAL | Status: AC
  Administered 2018-08-06: 5 mg via ORAL
  Filled 2018-08-06: qty 1

## 2018-08-06 MED ORDER — INFLUENZA VAC SPLIT QUAD 0.5 ML IM SUSY
0.5000 mL | PREFILLED_SYRINGE | INTRAMUSCULAR | Status: AC | PRN
  Administered 2018-08-12: 0.5 mL via INTRAMUSCULAR

## 2018-08-06 MED ORDER — SORBITOL 70 % SOLN
30.0000 mL | Freq: Once | Status: AC
  Administered 2018-08-06: 30 mL via ORAL
  Filled 2018-08-06: qty 30

## 2018-08-06 NOTE — Plan of Care (Deleted)
NA

## 2018-08-06 NOTE — Progress Notes (Signed)
CSW met with patient and caregiver at bedside. Caregiver states she was able to watch a dressing change again today and looking forward to participating in the dressing change soon. Patient stated he is doing well and feeling positive towards his recovery. Patient and caregiver appear motivated for recovery. CSW continues to follow for supportive needs. Raquel Sarna, [place], [place]

## 2018-08-06 NOTE — Evaluation (Signed)
Physical Therapy Evaluation Patient Details Name: Theodore Welch MRN: [id number] DOB: 17-Aug-1961 Today's Date: 08/06/2018   History of Present Illness  Pt is a 57 y.o. male admitted 07/21/18 with worsening SOB and orthopnea in setting of severe biventricular CHF. Now s/p LVAD on 9/16. PMH includes CHF, DDD, chronic back pain.    Clinical Impression  Pt presents with an overall decrease in functional mobility secondary to above. PTA, pt mod indep with intermittent use of rollator, drives, retired from work; lives with supportive fiance who will provide 24/7 support. Today, pt able to amb 400' with RW and intermittent min guard; VSS. Pt able to switch from wall to battery power with minimal cues. Already demonstrating great ability to manage device. Pt would benefit from continued acute PT services to maximize functional mobility and independence prior to d/c home.     Follow Up Recommendations Supervision for mobility/OOB;Other (comment)(OP Cardiac Rehab)    Equipment Recommendations  (TBD)    Recommendations for Other Services       Precautions / Restrictions Precautions Precautions: Sternal;Fall Precaution Comments: LVAD. Verbally reviewed sternal precautions      Mobility  Bed Mobility Overal bed mobility: Needs Assistance Bed Mobility: Sit to Supine       Sit to supine: Min assist   General bed mobility comments: min assist for LEs into bed, cues to maintain sternal precautions, increased time. Pt preferring to go from long sitting to supine despite educ on log roll  Transfers Overall transfer level: Needs assistance Equipment used: Rolling walker (2 wheeled) Transfers: Sit to/from Stand Sit to Stand: Min assist;Min guard         General transfer comment: Initial minA standing from recliner; min guard to stand from [facility] over toilet; cues for hands on knees  Ambulation/Gait Ambulation/Gait assistance: Min guard;+2 safety/equipment Gait Distance (Feet): 400  Feet Assistive device: Rolling walker (2 wheeled) Gait Pattern/deviations: Step-through pattern;Decreased stride length;Trunk flexed Gait velocity: Decreased Gait velocity interpretation: 1.31 - 2.62 ft/sec, indicative of limited community ambulator General Gait Details: Slow, steady amb with RW and intermittent min guard for balance. Pt intermittently dragging feet with trunk flexed but not interested in correcting despite cues. No standing rest breaks required; DOE 2/4  Stairs            Wheelchair Mobility    Modified Rankin (Stroke Patients Only)       Balance Overall balance assessment: Needs assistance   Sitting balance-Leahy Scale: Good       Standing balance-Leahy Scale: Fair                               Pertinent Vitals/Pain Pain Assessment: Faces Faces Pain Scale: Hurts a little bit Pain Location: Incision with coughing Pain Descriptors / Indicators: Grimacing;Sore Pain Intervention(s): Monitored during session    Home Living Family/patient expects to be discharged to:: Private residence Living Arrangements: Spouse/significant other(Fiance) Available Help at Discharge: Friend(s);Available 24 hours/day Type of Home: House Home Access: Ramped entrance     Home Layout: One level Home Equipment: Walker - 4 wheels;Hand held shower head;Shower seat;Cane - single point Additional Comments: Has grab bars for bathroom but not installed yet    Prior Function Level of Independence: Independent with assistive device(s)         Comments: walks with a rollator when outside home, use of 02 for cluster headaches     Hand Dominance   Dominant Hand: Right  Extremity/Trunk Assessment   Upper Extremity Assessment Upper Extremity Assessment: Overall WFL for tasks assessed    Lower Extremity Assessment Lower Extremity Assessment: Overall WFL for tasks assessed    Cervical / Trunk Assessment Cervical / Trunk Assessment: Kyphotic(forward  head)  Communication   Communication: No difficulties  Cognition Arousal/Alertness: Awake/alert Behavior During Therapy: WFL for tasks assessed/performed Overall Cognitive Status: Within Functional Limits for tasks assessed                                        General Comments General comments (skin integrity, edema, etc.): Fiance present throughout session and very supportive    Exercises     Assessment/Plan    PT Assessment Patient needs continued PT services  PT Problem List Decreased strength;Decreased activity tolerance;Decreased balance;Decreased mobility;Decreased knowledge of use of DME;Decreased knowledge of precautions;Cardiopulmonary status limiting activity       PT Treatment Interventions DME instruction;Gait training;Stair training;Functional mobility training;Therapeutic activities;Therapeutic exercise;Balance training;Patient/family education    PT Goals (Current goals can be found in the Care Plan section)  Acute Rehab PT Goals Patient Stated Goal: to live for his grandchildren PT Goal Formulation: With patient Time For Goal Achievement: 08/20/18 Potential to Achieve Goals: Good    Frequency Min 3X/week   Barriers to discharge        Co-evaluation PT/OT/SLP Co-Evaluation/Treatment: Yes Reason for Co-Treatment: Complexity of the patient's impairments (multi-system involvement) PT goals addressed during session: Mobility/safety with mobility;Balance;Proper use of DME OT goals addressed during session: ADL's and self-care       AM-PAC PT "6 Clicks" Daily Activity  Outcome Measure Difficulty turning over in bed (including adjusting bedclothes, sheets and blankets)?: A Little Difficulty moving from lying on back to sitting on the side of the bed? : Unable Difficulty sitting down on and standing up from a chair with arms (e.g., wheelchair, bedside commode, etc,.)?: Unable Help needed moving to and from a bed to chair (including a  wheelchair)?: A Little Help needed walking in hospital room?: A Little Help needed climbing 3-5 steps with a railing? : A Little 6 Click Score: 14    End of Session   Activity Tolerance: Patient tolerated treatment well Patient left: in bed;with call bell/phone within reach;with family/visitor present Nurse Communication: Mobility status PT Visit Diagnosis: Other abnormalities of gait and mobility (R26.89)    Time: 1001-1041 PT Time Calculation (min) (ACUTE ONLY): 40 min   Charges:   PT Evaluation $PT Eval High Complexity: 1 High         Ina Homes, PT, DPT Acute Rehabilitation Services  Pager [phone] Office [phone]  Malachy Chamber 08/06/2018, 1:30 PM

## 2018-08-06 NOTE — Progress Notes (Signed)
Patient ID: Theodore Welch, male   DOB: Jan 15, 1961, 57 y.o.   MRN: [id number] TCTS Evening Rounds:  Hemodynamically stable in sinus 105 CVP 10 VAD parameters stable Passing urine with foley out. Feels like he needs to have a BM but nothing coming out yet, just gas.  Exit site dressing dry.

## 2018-08-06 NOTE — Evaluation (Signed)
Occupational Therapy Evaluation Patient Details Name: Theodore Welch MRN: [id number] DOB: June 24, 1961 Today's Date: 08/06/2018    History of Present Illness Pt is a 57 y.o. male admitted 07/21/18 with worsening SOB and orthopnea in setting of severe biventricular CHF. Now s/p LVAD on 9/16. PMH includes CHF, DDD, chronic back pain.   Clinical Impression   Pt is typically modified independent in self care and ambulates with a rollator outside his home. He uses 02 for cluster headaches per his report. He presents with post operative pain, generalized weakness and impaired standing balance. He requires set up to moderate assistance for ADL. Pt walked entire unit with RW and manage LVAD power source change and donning/doffing vest with min assist. He vital signs were stable throughout. Pt likely to progress well and not requires post acute OT. Will follow acutely.    Follow Up Recommendations  No OT follow up    Equipment Recommendations  3 in 1 bedside commode    Recommendations for Other Services       Precautions / Restrictions Precautions Precautions: Sternal;Fall Precaution Comments: LVAD. Verbally reviewed sternal precautions Restrictions Weight Bearing Restrictions: Yes      Mobility Bed Mobility Overal bed mobility: Needs Assistance Bed Mobility: Sit to Supine       Sit to supine: Min assist   General bed mobility comments: min assist for LEs into bed, cues to maintain sternal precautions, increased time  Transfers Overall transfer level: Needs assistance Equipment used: Rolling walker (2 wheeled) Transfers: Sit to/from Stand Sit to Stand: Min assist;Min guard         General transfer comment: min assist to rise from recliner, min from 3 in 1, cues for hands on knees    Balance                                           ADL either performed or assessed with clinical judgement   ADL Overall ADL's : Needs assistance/impaired Eating/Feeding:  Set up;Sitting   Grooming: Min guard;Standing   Upper Body Bathing: Minimal assistance;Sitting   Lower Body Bathing: Moderate assistance;Sit to/from stand   Upper Body Dressing : Minimal assistance;Sitting   Lower Body Dressing: Moderate assistance;Sit to/from stand   Toilet Transfer: Ambulation;RW;BSC;Min guard(over toilet)   Toileting- Architect and Hygiene: Moderate assistance;Sit to/from stand   Tub/ Shower Transfer: Min guard;Ambulation;Rolling walker   Functional mobility during ADLs: Min guard;Rolling walker;+2 for safety/equipment General ADL Comments: min assist to manage changing power sources and LVAD vest, cues to check battery power and to take black bag with him, pt able to identify items in black bag     Vision Baseline Vision/History: Wears glasses Wears Glasses: At all times Patient Visual Report: No change from baseline       Perception     Praxis      Pertinent Vitals/Pain Pain Assessment: Faces Faces Pain Scale: Hurts a little bit Pain Location: Incision with coughing Pain Descriptors / Indicators: Grimacing;Sore Pain Intervention(s): Monitored during session;Repositioned     Hand Dominance Right   Extremity/Trunk Assessment Upper Extremity Assessment Upper Extremity Assessment: Overall WFL for tasks assessed   Lower Extremity Assessment Lower Extremity Assessment: Defer to PT evaluation   Cervical / Trunk Assessment Cervical / Trunk Assessment: Kyphotic(with forward head)   Communication Communication Communication: No difficulties   Cognition Arousal/Alertness: Awake/alert Behavior During Therapy: WFL for tasks assessed/performed  Overall Cognitive Status: Within Functional Limits for tasks assessed                                     General Comments       Exercises     Shoulder Instructions      Home Living Family/patient expects to be discharged to:: Private residence Living Arrangements:  Spouse/significant other(fiance) Available Help at Discharge: Friend(s);Available 24 hours/day Type of Home: House Home Access: Ramped entrance     Home Layout: One level     Bathroom Shower/Tub: Chief Strategy Officer: Standard     Home Equipment: Walker - 4 wheels;Hand held shower head;Shower seat;Cane - single point(has a grab bar, but not installed, 02)          Prior Functioning/Environment Level of Independence: Independent with assistive device(s)        Comments: walks with a rollator when outside home, use of 02 for cluster headaches        OT Problem List: Decreased strength;Decreased activity tolerance;Impaired balance (sitting and/or standing);Decreased knowledge of use of DME or AE;Pain;Cardiopulmonary status limiting activity;Impaired UE functional use      OT Treatment/Interventions: Self-care/ADL training;DME and/or AE instruction;Patient/family education;Balance training;Therapeutic activities;Energy conservation    OT Goals(Current goals can be found in the care plan section) Acute Rehab OT Goals Patient Stated Goal: to live for his grandchildren OT Goal Formulation: With patient Time For Goal Achievement: 08/20/18 Potential to Achieve Goals: Good ADL Goals Pt Will Perform Grooming: with supervision;standing Pt Will Perform Lower Body Bathing: with supervision;sit to/from stand Pt Will Perform Lower Body Dressing: with supervision;sit to/from stand Pt Will Transfer to Toilet: with supervision;ambulating Pt Will Perform Toileting - Clothing Manipulation and hygiene: with supervision;sit to/from stand Additional ADL Goal #1: Pt will perform bed mobility modified independently. Additional ADL Goal #2: Pt will manage LVAD equipment independently  OT Frequency: Min 2X/week   Barriers to D/C:            Co-evaluation PT/OT/SLP Co-Evaluation/Treatment: Yes Reason for Co-Treatment: Complexity of the patient's impairments (multi-system  involvement)   OT goals addressed during session: ADL's and self-care      AM-PAC PT "6 Clicks" Daily Activity     Outcome Measure Help from another person eating meals?: None Help from another person taking care of personal grooming?: A Little Help from another person toileting, which includes using toliet, bedpan, or urinal?: A Lot Help from another person bathing (including washing, rinsing, drying)?: A Lot Help from another person to put on and taking off regular upper body clothing?: A Little Help from another person to put on and taking off regular lower body clothing?: A Lot 6 Click Score: 16   End of Session Equipment Utilized During Treatment: Gait belt;Rolling walker Nurse Communication: Mobility status  Activity Tolerance: Patient tolerated treatment well Patient left: in bed;with call bell/phone within reach;with family/visitor present  OT Visit Diagnosis: Unsteadiness on feet (R26.81);Other abnormalities of gait and mobility (R26.89);Pain;Muscle weakness (generalized) (M62.81)                Time: 1001-1043 OT Time Calculation (min): 42 min Charges:  OT General Charges $OT Visit: 1 Visit OT Evaluation $OT Eval High Complexity: 1 High OT Treatments $Self Care/Home Management : 8-22 mins  Martie Round, OTR/L Acute Rehabilitation Services Pager: [phone] Office: [phone]  Evern Bio 08/06/2018, 11:44 AM

## 2018-08-06 NOTE — Progress Notes (Signed)
Patient ID: Theodore Welch, male   DOB: 1961-01-11, 57 y.o.   MRN: [id number]     Advanced Heart Failure Rounding Note  PCP-Cardiologist: No primary care provider on file.   Subjective:   - S/P HMIII on 9/16 - Extubated and Swan out 9/17  On epi 0.5 mcg and milrinone 0.25 mcg. CO-OX 72%.  I/Os negative with no diuretics.  CVP 5-7. MAP 80s.   Walked unit yesterday.  No dyspnea, pain at surgical site.   LDH 240 => 263 => 255, creatinine 0.62 => 0.5 => 0.55, tbili 2.5 => 2.3 => 1.9, hgb 8.3 => 8.5 => 8.2, WBCs 12.7 => 15 => 15.7.    HMIII Speed 5400 Flow 4.5 Power 3.9 PI 4.3. 7 PI events.    Objective:    Weight Range: 66.8 kg Body mass index is 21.13 kg/m.   Vital Signs:   Temp:  [98.6 F (37 C)-99.8 F (37.7 C)] 99.3 F (37.4 C) (09/19 0757) Pulse Rate:  [25-122] 112 (09/19 0800) Resp:  [18-39] 26 (09/19 0800) BP: (82-139)/(64-118) 84/68 (09/19 0800) SpO2:  [94 %-100 %] 100 % (09/19 0800) Arterial Line BP: (80-98)/(53-82) 97/82 (09/18 1045) Weight:  [66.8 kg] 66.8 kg (09/19 0500) Last BM Date: 08/02/18  Weight change: Filed Weights   08/04/18 0424 08/05/18 0500 08/06/18 0500  Weight: 71.5 kg 71.9 kg 66.8 kg   Intake/Output:   Intake/Output Summary (Last 24 hours) at 08/06/2018 0849 Last data filed at 08/06/2018 0823 Gross per 24 hour  Intake 1517.73 ml  Output 3095 ml  Net -1577.27 ml    Physical Exam   CVP 6 Physical Exam: General: Well appearing this am. NAD.  HEENT: Normal. Neck: Supple, JVP 7-8 cm. Carotids OK.  Cardiac:  Mechanical heart sounds with LVAD hum present.  Lungs:  CTAB, normal effort.  Abdomen:  NT, ND, no HSM. No bruits or masses. +BS  LVAD exit site: Well-healed and incorporated. Dressing dry and intact. No erythema or drainage. Stabilization device present and accurately applied. Driveline dressing changed daily per sterile technique. Extremities:  Warm and dry. No cyanosis, clubbing, rash, or edema.  Neuro:  Alert & oriented x 3.  Cranial nerves grossly intact. Moves all 4 extremities w/o difficulty. Affect pleasant      Telemetry   Sinus tachy 100s, personally reviewed.   EKG    Sinus tachy  Labs    CBC Recent Labs    08/05/18 0519 08/06/18 0500  WBC 15.7* 15.7*  NEUTROABS 11.3* 11.3*  HGB 8.3* 8.2*  HCT 25.7* 25.3*  MCV 88.0 88.2  PLT 131* 140*   Basic Metabolic Panel Recent Labs    66/44/03 0252 08/05/18 0746 08/06/18 0500  NA 137  --  136  K 4.6  --  4.2  CL 101  --  101  CO2 26  --  24  GLUCOSE 109*  --  145*  BUN 13  --  10  CREATININE 0.51*  --  0.55*  CALCIUM 9.8  --  9.8  MG 0.9* 2.1 1.9  PHOS 3.4  --  2.9   Liver Function Tests Recent Labs    08/05/18 0252 08/06/18 0500  AST 55* 30  ALT 20 17  ALKPHOS 67 70  BILITOT 2.3* 1.9*  PROT 5.9* 6.1*  ALBUMIN 3.7 3.3*   No results for input(s): LIPASE, AMYLASE in the last 72 hours. Cardiac Enzymes No results for input(s): CKTOTAL, CKMB, CKMBINDEX, TROPONINI in the last 72 hours.  BNP: BNP (last 3  results) Recent Labs    07/21/18 0540 08/04/18 0902  BNP 1,343.9* 658.6*    ProBNP (last 3 results) No results for input(s): PROBNP in the last 8760 hours.   D-Dimer Recent Labs    08/03/18 1317  DDIMER 1.62*   Hemoglobin A1C No results for input(s): HGBA1C in the last 72 hours. Fasting Lipid Panel No results for input(s): CHOL, HDL, LDLCALC, TRIG, CHOLHDL, LDLDIRECT in the last 72 hours. Thyroid Function Tests No results for input(s): TSH, T4TOTAL, T3FREE, THYROIDAB in the last 72 hours.  Invalid input(s): FREET3  Other results:   Imaging    [name] Ekg Site Rite  Result Date: 08/05/2018 If Site Rite image not attached, placement could not be confirmed due to current cardiac rhythm.    Medications:     Scheduled Medications: . acetaminophen  1,000 mg Oral Q6H   Or  . acetaminophen (TYLENOL) oral liquid 160 mg/5 mL  1,000 mg Per Tube Q6H  . aspirin EC  325 mg Oral Daily   Or  . aspirin  324 mg Per  Tube Daily   Or  . aspirin  300 mg Rectal Daily  . bisacodyl  10 mg Oral Daily   Or  . bisacodyl  10 mg Rectal Daily  . Chlorhexidine Gluconate Cloth  6 each Topical Daily  . digoxin  0.125 mg Oral Daily  . docusate sodium  200 mg Oral Daily  . DULoxetine  60 mg Oral Daily  . feeding supplement (ENSURE ENLIVE)  237 mL Oral BID BM  . gabapentin  600 mg Oral TID  . insulin aspart  0-24 Units Subcutaneous TID WC  . pantoprazole  40 mg Oral Daily  . sodium chloride flush  10-40 mL Intracatheter Q12H  . sodium chloride flush  3 mL Intravenous Q12H  . thiamine  100 mg Oral Daily  . warfarin  5 mg Oral ONCE-1800  . Warfarin - Physician Dosing Inpatient   Does not apply q1800    Infusions: . sodium chloride Stopped (08/04/18 1055)  . sodium chloride    . sodium chloride Stopped (08/05/18 0728)  . lactated ringers Stopped (08/04/18 0715)  . lactated ringers 10 mL/hr at 08/06/18 0800  . milrinone 0.25 mcg/kg/min (08/06/18 0800)    PRN Medications: sodium chloride, hydrALAZINE, Influenza vac split quadrivalent PF, morphine injection, nicotine, ondansetron (ZOFRAN) IV, oxyCODONE, sodium chloride flush, sodium chloride flush, traMADol    Patient Profile   Theodore Welch is a 57 y.o. 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  Admitted with worsening SOB and orthopnea in setting of severe biventricular CHF.   Assessment/Plan   1. Acute on chronic systolic CHF:  Nonischemic cardiomyopathy based on cath 7/19 in [place]. Remote heavy ETOH, says he has quit drinking completely for about 2 months now. HIV negative.  No cocaine/amphetamines.  Echo was done this admission with biventricular failure, LV EF 10-20% with dilated RV. There is moderate to severe MR and severe TR with poor coaptation of TV leaflets. On exam, he was markedly volume overloaded initially.  Low output HF indicated by low Co-ox of 43% initially.  cMRI  07/23/18 LVEF EF 14%, Severe RF dysfunction (EF 25%) with nonspecific RV insertion site LGE pattern likely from pressure/volume overload. RHC showed good cardiac output on milrinone with filling pressures nearing normal.  PAPi 2.65 suggested that RV function is better than expected from imaging.  Echo on 9/10 after diuresis showed EF 20-25%,  RV looked better, mild to moderate MR, moderate TR. 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 CO-OX 72% today, CVP 6.  Currently on epinephrine 0.5, milrinone 0.25.  MAP 80s.  - Good UOP, no Lasix for now.  - Decrease milrinone to 0.125 today.   - Stop epinephrine today.   - 325 mg ASA until INR 2.  - Coumadin.  - Ramp echo when off milrinone.  2. OSA: In the community on CPAP.    3. COPD:  He had mostly quit smoking before admission but still smoked a few cigarettes/week. PFTs 07/24/18 FVC 3.19 (79%), FEV1 2.47 (78%), DLCO 53%, TLC 82%.  - No change to current plan.   4. 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 ?component of ETOH cirrhosis given prior heavy ETOH (has now quit). RUQ US showed ascites with evidence for cirrhosis. NH3 with initial elevation at 64 on 9/3. CT abdomen showed resolution of ascites but there was evidence for cirrhosis. Tbili 1.9 today.    5. RLE DVT:  He has a right peroneal vein (below knee) DVT.  Even though below knee, think he is a relatively high risk patient with severe cardiomyopathy.  He will be on anticoagulation for LVAD.   6. Valvular heart disease:  Moderate to severe MR and severe TR on initial echo.  Suspect this was functional due to annular dilation.  Repeat echo after diuresis on 9/10 showed mild-moderate MR, moderate TR.    7. VT: Run overnight 9/17 in setting of hypomagnesemia and aggressive diuresis.  No further VT.    Mobilize today.   Marca Ancona, MD  08/06/2018 8:49 AM

## 2018-08-06 NOTE — Progress Notes (Signed)
Patient ID: Theodore Welch, male   DOB: 1961/03/01, 57 y.o.   MRN: [id number] HeartMate 3 Rounding Note  Subjective:    Feels better today. Ambulating well, pain controlled. Passing flatus but no BM yet.  MAP 80's CVP 5-6 Epi 0.5, milrinone 0.25  LVAD INTERROGATION:  HeartMate IIl LVAD:  Flow 4.4 liters/min, speed 5400, power 3.9, PI 3.3.    Objective:    Vital Signs:   Temp:  [98.6 F (37 C)-99.8 F (37.7 C)] 99.3 F (37.4 C) (09/19 0757) Pulse Rate:  [25-122] 112 (09/19 0800) Resp:  [18-39] 26 (09/19 0800) BP: (82-139)/(64-118) 84/68 (09/19 0800) SpO2:  [94 %-100 %] 100 % (09/19 0800) Arterial Line BP: (80-98)/(53-82) 97/82 (09/18 1045) Weight:  [66.8 kg] 66.8 kg (09/19 0500) Last BM Date: 08/02/18 Mean arterial Pressure 80's  Intake/Output:   Intake/Output Summary (Last 24 hours) at 08/06/2018 0829 Last data filed at 08/06/2018 0823 Gross per 24 hour  Intake 1517.73 ml  Output 3095 ml  Net -1577.27 ml     Physical Exam: General:  Well appearing. No resp difficulty HEENT: normal Cor: occasional heart sounds with LVAD hum present. Lungs: clear Chest dressing dry Abdomen: soft, nontender, nondistended. No hepatosplenomegaly. No bruits or masses. Good bowel sounds. Bloody drainage saturating drive line dressing. Extremities: no cyanosis, clubbing, rash, edema Neuro: alert & orientedx3, cranial nerves grossly intact. moves all 4 extremities w/o difficulty. Affect pleasant  Telemetry: sinus 90-100's  Labs: Basic Metabolic Panel: Recent Labs  Lab 08/03/18 0412  08/03/18 1317  08/03/18 1926 08/04/18 0311 08/04/18 1612 08/04/18 1622 08/05/18 0252 08/05/18 0746 08/06/18 0500  NA 137   < > 142   < > 139 141  --  138 137  --  136  K 4.3   < > 4.3   < > 4.2 4.5 3.9 3.9 4.6  --  4.2  CL 99   < > 105   < > 103 107  --  101 101  --  101  CO2 28  --  27  --   --  27  --   --  26  --  24  GLUCOSE 102*   < > 109*   < > 151* 103*  --  116* 109*  --  145*  BUN 27*    < > 19   < > 20 14  --  12 13  --  10  CREATININE 0.78   < > 0.70   < > 0.60* 0.62 0.56* 0.50* 0.51*  --  0.55*  CALCIUM 10.1  --  9.0  --   --  9.8  --   --  9.8  --  9.8  MG  --   --  1.7   < >  --  2.3 1.8  --  0.9* 2.1 1.9  PHOS  --   --   --   --   --  4.4  --   --  3.4  --  2.9   < > = values in this interval not displayed.    Liver Function Tests: Recent Labs  Lab 07/31/18 0628 08/01/18 0500 08/04/18 0311 08/05/18 0252 08/06/18 0500  AST 24 26 64* 55* 30  ALT 20 22 18 20 17   ALKPHOS 97 103 66 67 70  BILITOT 1.7* 1.9* 2.5* 2.3* 1.9*  PROT 6.3* 6.5 6.1* 5.9* 6.1*  ALBUMIN 3.2* 3.4* 3.6 3.7 3.3*   No results for input(s): LIPASE, AMYLASE in the last 168 hours.  No results for input(s): AMMONIA in the last 168 hours.  CBC: Recent Labs  Lab 08/04/18 0311 08/04/18 1612 08/04/18 1622 08/05/18 0252 08/05/18 0519 08/06/18 0500  WBC 12.7* 16.1*  --  15.2* 15.7* 15.7*  NEUTROABS 9.5*  --   --  9.9* 11.3* 11.3*  HGB 8.3* 9.1* 10.2* 8.5* 8.3* 8.2*  HCT 26.6* 28.7* 30.0* 26.3* 25.7* 25.3*  MCV 88.7 89.7  --  89.2 88.0 88.2  PLT 114* 144*  --  146* 131* 140*    INR: Recent Labs  Lab 08/02/18 2309 08/03/18 1317 08/04/18 0311 08/05/18 0252 08/06/18 0500  INR 1.03 1.33 1.14 1.28 1.29    Other results:  EKG:   Imaging: Dg Chest Port 1 View  Result Date: 08/05/2018 CLINICAL DATA:  57 year old male postoperative day 2 status post LVAD. EXAM: PORTABLE CHEST 1 VIEW COMPARISON:  08/04/2018 and earlier. FINDINGS: Portable AP semi upright view at 0631 hours. Extubated. Enteric tube removed. Swan-Ganz catheter removed, right IJ introducer sheath remains. Stable left chest tube. Partially visible LVAD. Mildly lower lung volumes with obscuration of the central diaphragm. No pneumothorax or pulmonary edema. No definite pleural effusion. Stable cardiomegaly and mediastinal contours. IMPRESSION: 1. Extubated, enteric tube and Swan-Ganz catheter removed. Stable left chest tube. 2.  Lower lung volumes with basilar atelectasis. No pneumothorax or edema. Electronically Signed   By: Odessa Fleming M.D.   On: 08/05/2018 10:40   [name] Ekg Site Rite  Result Date: 08/05/2018 If Site Rite image not attached, placement could not be confirmed due to current cardiac rhythm.    Medications:     Scheduled Medications: . acetaminophen  1,000 mg Oral Q6H   Or  . acetaminophen (TYLENOL) oral liquid 160 mg/5 mL  1,000 mg Per Tube Q6H  . aspirin EC  325 mg Oral Daily   Or  . aspirin  324 mg Per Tube Daily   Or  . aspirin  300 mg Rectal Daily  . bisacodyl  10 mg Oral Daily   Or  . bisacodyl  10 mg Rectal Daily  . Chlorhexidine Gluconate Cloth  6 each Topical Daily  . digoxin  0.125 mg Oral Daily  . docusate sodium  200 mg Oral Daily  . DULoxetine  60 mg Oral Daily  . feeding supplement (ENSURE ENLIVE)  237 mL Oral BID BM  . gabapentin  600 mg Oral TID  . insulin aspart  0-24 Units Subcutaneous TID WC  . pantoprazole  40 mg Oral Daily  . sodium chloride flush  10-40 mL Intracatheter Q12H  . sodium chloride flush  3 mL Intravenous Q12H  . thiamine  100 mg Oral Daily  . warfarin  5 mg Oral ONCE-1800  . Warfarin - Physician Dosing Inpatient   Does not apply q1800    Infusions: . sodium chloride Stopped (08/04/18 1055)  . sodium chloride    . sodium chloride Stopped (08/05/18 0728)  . lactated ringers Stopped (08/04/18 0715)  . lactated ringers 10 mL/hr at 08/06/18 0800  . milrinone 0.25 mcg/kg/min (08/06/18 0800)    PRN Medications: sodium chloride, hydrALAZINE, Influenza vac split quadrivalent PF, morphine injection, nicotine, ondansetron (ZOFRAN) IV, oxyCODONE, sodium chloride flush, sodium chloride flush, traMADol   Assessment/Plan/Discussion:     POD 3 s/p Heartmate III for nonischemic cardiomyopathy with EF 10-20% on admission with dilated and hypokinetic RV, moderate to severe MR and severe TR. He tuned up nicely preop with milrinone and diuresis with reduction of CVP  to normal, moderate MR and  mild to moderate TR.  Hemodynamics look good on milrinone and small dose of epi. Co-ox is 72%. DC epi.  Discuss decreasing milrinone.  Bloody drainage from drive line site. Will check it when dressing change done today.  INR has not bumped yet. Coumadin 5 mg tonight. Continue ASA. Hold further Lovenox with bloody drainage from drive line site. He had a right peroneal DVT preop.  Diuresed well on his own yesterday, -2L. Wt is down 11 lbs but probably not accurate. Still about 7 lbs over preop. CVP is low.   DC foley.  DM: glucose under adequate control. Continue SSI AC/HS.  IS, OOB, ambulate.   I reviewed the LVAD parameters from today, and compared the results to the patient's prior recorded data.  No programming changes were made.  The LVAD is functioning within specified parameters.  The patient performs LVAD self-test daily.  LVAD interrogation was negative for any significant power changes, alarms or PI events/speed drops.  LVAD equipment check completed and is in good working order.  Back-up equipment present.   LVAD education done on emergency procedures and precautions and reviewed exit site care.  Length of Stay: [address]  Payton Doughty [facility] 08/06/2018, 8:29 AM

## 2018-08-06 NOTE — Progress Notes (Signed)
LVAD Coordinator Rounding Note:  Admitted 07/21/18 due to worsening of LE edema, orthopnea and PND over the past 2-3 days. Dr. Shirlee Latch consulted.   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 Laneta Simmers under DT criteria due to current smoker and positive THC.  Pt lying in bed this am, states that he feels much better than yesterday. Dr. Laneta Simmers at bedside to assess driveline site. Nurse had to reinforce dressing this morning due to bloody saturation.  Vital signs: Tmax: 98.4 HR: 111 Doppler Pressure:  82 Arterial BP: 84/68 (74) O2: 100% on RA Wt: 138.0>157.6>158.5>147.2 lbs   LVAD interrogation reveals:   Speed: 5400 Flow: 4.5 Power:  4.0 PI: 3.8 Alarms: none Events:  50+ PI events for today Hematocrit: 25 Fixed speed: 5400 Low speed limit: 5100   Drive Line: Daily dressing w/silver strip. Existing VAD dressing removed and site care performed using sterile technique. Drive line exit site cleaned with Chlora prep applicators x 2, allowed to dry, and gauze dressing w/extra 4 x 4 along with silver strip re-applied. Exit site healing and unincorporated, the velour is fully implanted at exit site. 1 suture intact. Gross amount of bloody drainage. Clot wrapped around driveline, removed with cleaning. No active bleeding.  No redness, tenderness, foul odor or rash noted. Next dressing change due 08/07/18.     Labs:  LDH trend: 240>263>255  INR trend: 1.14>1.28>1.29  Anticoagulation Plan: -INR Goal: 2-2.5 -ASA Dose: 325 mg until INR therapeutiv  Blood Products:  -intra op: 08/03/18 3 u FFP  Arrythmias:   Respiratory: Pt extubated 08/05/18  Gtts: Milrinone 0.125 mcg/kg/min Epi 0.5 mcg/min off 08/06/18   Adverse Events on VAD:  VAD Education: 1. Delivered Patient VAD Discharge Binder to room for patient and caregiver review. Caregiver has begun reading HM III Patient Handbook per bedside nurse.   Plan/Recommendations:   1. VAD Coordinator or nurse champion to change drive line  dressings.  2. Continue to mobilize pt.  3. Call VAD coordinator for any equipment issues or drive line concerns.   [name] RN, BSN VAD Coordinator 24/7 Pager [phone]

## 2018-08-06 NOTE — Progress Notes (Signed)
Palliative:  I met today with Theodore Welch and Theodore Welch. Theodore Welch is very motivated to proceed with learning dressing change and LVAD care. [name] is having some pain but is relieved with prn medication. They are happy with progress. He appears to be doing very well. Offered encouragement and emotional support.   No charge  Vinie Sill, NP Palliative Medicine Team Pager # [phone] (M-F 8a-5p) Team Phone # [phone] (Nights/Weekends)

## 2018-08-07 LAB — BASIC METABOLIC PANEL
Anion gap: 11 (ref 5–15)
BUN: 11 mg/dL (ref 6–20)
CO2: 25 mmol/L (ref 22–32)
Calcium: 9.8 mg/dL (ref 8.9–10.3)
Chloride: 103 mmol/L (ref 98–111)
Creatinine, Ser: 0.54 mg/dL — ABNORMAL LOW (ref 0.61–1.24)
GFR calc Af Amer: >60 mL/min (ref >60)
GFR calc non Af Amer: >60 mL/min (ref >60)
Glucose, Bld: 103 mg/dL — ABNORMAL HIGH (ref 70–99)
Potassium: 4.1 mmol/L (ref 3.5–5.1)
Sodium: 139 mmol/L (ref 135–145)

## 2018-08-07 LAB — GLUCOSE, CAPILLARY
GLUCOSE-CAPILLARY: 75 mg/dL (ref 70–99)
Glucose-Capillary: 100 mg/dL — ABNORMAL HIGH (ref 70–99)
Glucose-Capillary: 102 mg/dL — ABNORMAL HIGH (ref 70–99)
Glucose-Capillary: 148 mg/dL — ABNORMAL HIGH (ref 70–99)

## 2018-08-07 LAB — CBC WITH DIFFERENTIAL/PLATELET
ABS IMMATURE GRANULOCYTES: 0.1 10*3/uL (ref 0.0–0.1)
BASOS ABS: 0 10*3/uL (ref 0.0–0.1)
Basophils Relative: 0 %
Eosinophils Absolute: 0.3 10*3/uL (ref 0.0–0.7)
Eosinophils Relative: 2 %
HCT: 24.3 % — ABNORMAL LOW (ref 39.0–52.0)
Hemoglobin: 7.8 g/dL — ABNORMAL LOW (ref 13.0–17.0)
IMMATURE GRANULOCYTES: 1 %
LYMPHS PCT: 11 %
Lymphs Abs: 1.3 10*3/uL (ref 0.7–4.0)
MCH: 28.5 pg (ref 26.0–34.0)
MCHC: 32.1 g/dL (ref 30.0–36.0)
MCV: 88.7 fL (ref 78.0–100.0)
Monocytes Absolute: 2.1 10*3/uL — ABNORMAL HIGH (ref 0.1–1.0)
Monocytes Relative: 17 %
NEUTROS ABS: 8.7 10*3/uL — AB (ref 1.7–7.7)
NEUTROS PCT: 69 %
Platelets: 182 10*3/uL (ref 150–400)
RBC: 2.74 MIL/uL — AB (ref 4.22–5.81)
RDW: 15.9 % — ABNORMAL HIGH (ref 11.5–15.5)
WBC: 12.4 10*3/uL — AB (ref 4.0–10.5)

## 2018-08-07 LAB — PROTIME-INR
INR: 1.28
Prothrombin Time: 15.8 seconds — ABNORMAL HIGH (ref 11.4–15.2)

## 2018-08-07 LAB — COOXEMETRY PANEL
Carboxyhemoglobin: 2.5 % — ABNORMAL HIGH (ref 0.5–1.5)
Methemoglobin: 0.8 % (ref 0.0–1.5)
O2 Saturation: 61.6 %
Total hemoglobin: 9.5 g/dL — ABNORMAL LOW (ref 12.0–16.0)

## 2018-08-07 LAB — MAGNESIUM: MAGNESIUM: 1.8 mg/dL (ref 1.7–2.4)

## 2018-08-07 LAB — LACTATE DEHYDROGENASE: LDH: 242 U/L — AB (ref 98–192)

## 2018-08-07 LAB — PHOSPHORUS: PHOSPHORUS: 3.8 mg/dL (ref 2.5–4.6)

## 2018-08-07 MED ORDER — FUROSEMIDE 10 MG/ML IJ SOLN
40.0000 mg | Freq: Once | INTRAMUSCULAR | Status: AC
  Administered 2018-08-07: 40 mg via INTRAVENOUS
  Filled 2018-08-07: qty 4

## 2018-08-07 MED ORDER — WARFARIN SODIUM 7.5 MG PO TABS
7.5000 mg | ORAL_TABLET | Freq: Once | ORAL | Status: AC
  Administered 2018-08-07: 7.5 mg via ORAL
  Filled 2018-08-07: qty 1

## 2018-08-07 MED ORDER — LOSARTAN POTASSIUM 25 MG PO TABS
25.0000 mg | ORAL_TABLET | Freq: Every day | ORAL | Status: DC
  Administered 2018-08-07 – 2018-08-12 (×6): 25 mg via ORAL
  Filled 2018-08-07 (×6): qty 1

## 2018-08-07 MED ORDER — FE FUMARATE-B12-VIT C-FA-IFC PO CAPS
1.0000 | ORAL_CAPSULE | Freq: Three times a day (TID) | ORAL | Status: DC
  Administered 2018-08-07 – 2018-08-12 (×16): 1 via ORAL
  Filled 2018-08-07 (×18): qty 1

## 2018-08-07 MED ORDER — POTASSIUM CHLORIDE CRYS ER 20 MEQ PO TBCR
20.0000 meq | EXTENDED_RELEASE_TABLET | Freq: Three times a day (TID) | ORAL | Status: AC
  Administered 2018-08-07 (×3): 20 meq via ORAL
  Filled 2018-08-07 (×3): qty 1

## 2018-08-07 NOTE — Progress Notes (Signed)
LVAD Coordinator Rounding Note:  Admitted 07/21/18 due to worsening of LE edema, orthopnea and PND over the past 2-3 days. Dr. Shirlee Latch consulted.   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 Laneta Simmers under DT criteria due to current smoker and positive THC.  Pt sitting up in the chair, states that he has been walking around the unit today.   Vital signs: Tmax: 98.1 HR: 110 Doppler Pressure: 104 Arterial BP: 107/94 (100) O2: 98% on RA Wt: 138.0>157.6>158.5>147.2>149.4 lbs   LVAD interrogation reveals:   Speed: 5400 Flow: 3.9 Power:  4.0 PI: 5.0 Alarms: none Events:  1 PI event for today Hematocrit: 25 Fixed speed: 5400 Low speed limit: 5100   Drive Line: Daily dressing w/silver strip. Existing VAD dressing removed and site care performed using sterile technique. Drive line exit site cleaned with Chlora prep applicators x 2, allowed to dry, and gauze dressing w/extra 4 x 4 along with silver strip re-applied. Exit site healing and unincorporated, the velour is fully implanted at exit site. 1 suture intact. Moderate amount of bloody drainage, much less than yesterday. No active bleeding.  No redness, tenderness, foul odor or rash noted. Next dressing change due 08/08/18, Jae Dire nurse champion will perform this dressing change along with caregiver.      Labs:  LDH trend: 240>263>255>242  INR trend: 1.14>1.28>1.29>1.28  Anticoagulation Plan: -INR Goal: 2-2.5 -ASA Dose: 325 mg until INR therapeutiv  Blood Products:  -intra op: 08/03/18 3 u FFP  Arrythmias:   Respiratory: Pt extubated 08/05/18  Gtts: Milrinone 0.125 mcg/kg/min off 08/07/18 Epi 0.5 mcg/min off 08/06/18   Adverse Events on VAD:  VAD Education: 1. Delivered Patient VAD Discharge Binder to room for patient and caregiver review. Caregiver has begun reading HM III Patient Handbook per bedside nurse.  2. Bedside nurse to practice donning sterile gloves with pt caregiver-[name].  Plan/Recommendations:   1. VAD  Coordinator or nurse champion to change drive line dressings.  2. Continue to mobilize pt.  3. Call VAD coordinator for any equipment issues or drive line concerns.  4. Pt may transfer to 2C, all pt home equipment has been ordered.  [name] RN, BSN VAD Coordinator 24/7 Pager [phone]

## 2018-08-07 NOTE — Progress Notes (Signed)
Patient ID: Theodore Welch, male   DOB: Oct 02, 1961, 57 y.o.   MRN: [id number]     Advanced Heart Failure Rounding Note  PCP-Cardiologist: No primary care provider on file.   Subjective:   - S/P HMIII on 9/16 - Extubated and Swan out 9/17  He is on milrinone 0.125 with co-ox 62%, CVP ranging 9-11.  MAP 80s-90s.   Walked unit yesterday.  No dyspnea, pain is controlled.   LDH 240 => 263 => 255 => 242, creatinine 0.62 => 0.5 => 0.55 => 0.54, tbili 2.5 => 2.3 => 1.9, hgb 8.3 => 8.5 => 8.2 => 7.8, WBCs 12.7 => 15 => 15.7 => 12.4.    HMIII Speed 5400 Flow 4.6 Power 3.8 PI 4.1. Few PI events.    Objective:    Weight Range: 67.8 kg Body mass index is 21.45 kg/m.   Vital Signs:   Temp:  [98.2 F (36.8 C)-99.7 F (37.6 C)] 99.7 F (37.6 C) (09/20 0400) Pulse Rate:  [34-124] 117 (09/20 0600) Resp:  [20-32] 24 (09/20 0600) BP: (77-111)/(50-94) 107/94 (09/20 0738) SpO2:  [92 %-100 %] 100 % (09/19 1900) Weight:  [67.8 kg] 67.8 kg (09/20 0500) Last BM Date: 08/02/18  Weight change: Filed Weights   08/05/18 0500 08/06/18 0500 08/07/18 0500  Weight: 71.9 kg 66.8 kg 67.8 kg   Intake/Output:   Intake/Output Summary (Last 24 hours) at 08/07/2018 0837 Last data filed at 08/07/2018 0700 Gross per 24 hour  Intake 1319.81 ml  Output 377 ml  Net 942.81 ml    Physical Exam   CVP 9-11 Physical Exam: General: Well appearing this am. NAD.  HEENT: Normal. Neck: Supple, JVP 8 cm. Carotids OK.  Cardiac:  Mechanical heart sounds with LVAD hum present.  Lungs:  CTAB, normal effort.  Abdomen:  NT, ND, no HSM. No bruits or masses. +BS  LVAD exit site: Well-healed and incorporated. Dressing dry and intact. No erythema or drainage. Stabilization device present and accurately applied. Driveline dressing changed daily per sterile technique. Extremities:  Warm and dry. No cyanosis, clubbing, rash, or edema.  Neuro:  Alert & oriented x 3. Cranial nerves grossly intact. Moves all 4 extremities w/o  difficulty. Affect pleasant     Telemetry   Sinus tachy 100s, personally reviewed.   EKG    Sinus tachy  Labs    CBC Recent Labs    08/06/18 0500 08/07/18 0536  WBC 15.7* 12.4*  NEUTROABS 11.3* 8.7*  HGB 8.2* 7.8*  HCT 25.3* 24.3*  MCV 88.2 88.7  PLT 140* 182   Basic Metabolic Panel Recent Labs    40/98/11 0500 08/07/18 0536  NA 136 139  K 4.2 4.1  CL 101 103  CO2 24 25  GLUCOSE 145* 103*  BUN 10 11  CREATININE 0.55* 0.54*  CALCIUM 9.8 9.8  MG 1.9 1.8  PHOS 2.9 3.8   Liver Function Tests Recent Labs    08/05/18 0252 08/06/18 0500  AST 55* 30  ALT 20 17  ALKPHOS 67 70  BILITOT 2.3* 1.9*  PROT 5.9* 6.1*  ALBUMIN 3.7 3.3*   No results for input(s): LIPASE, AMYLASE in the last 72 hours. Cardiac Enzymes No results for input(s): CKTOTAL, CKMB, CKMBINDEX, TROPONINI in the last 72 hours.  BNP: BNP (last 3 results) Recent Labs    07/21/18 0540 08/04/18 0902  BNP 1,343.9* 658.6*    ProBNP (last 3 results) No results for input(s): PROBNP in the last 8760 hours.   D-Dimer No results for input(s):  DDIMER in the last 72 hours. Hemoglobin A1C No results for input(s): HGBA1C in the last 72 hours. Fasting Lipid Panel No results for input(s): CHOL, HDL, LDLCALC, TRIG, CHOLHDL, LDLDIRECT in the last 72 hours. Thyroid Function Tests No results for input(s): TSH, T4TOTAL, T3FREE, THYROIDAB in the last 72 hours.  Invalid input(s): FREET3  Other results:   Imaging    No results found.   Medications:     Scheduled Medications: . acetaminophen  1,000 mg Oral Q6H   Or  . acetaminophen (TYLENOL) oral liquid 160 mg/5 mL  1,000 mg Per Tube Q6H  . aspirin EC  325 mg Oral Daily   Or  . aspirin  324 mg Per Tube Daily   Or  . aspirin  300 mg Rectal Daily  . bisacodyl  10 mg Oral Daily   Or  . bisacodyl  10 mg Rectal Daily  . Chlorhexidine Gluconate Cloth  6 each Topical Daily  . digoxin  0.125 mg Oral Daily  . docusate sodium  200 mg Oral  Daily  . DULoxetine  60 mg Oral Daily  . feeding supplement (ENSURE ENLIVE)  237 mL Oral BID BM  . ferrous fumarate-b12-vitamic C-folic acid  1 capsule Oral TID PC  . furosemide  40 mg Intravenous Once  . gabapentin  600 mg Oral TID  . insulin aspart  0-24 Units Subcutaneous TID WC  . losartan  25 mg Oral Daily  . pantoprazole  40 mg Oral Daily  . potassium chloride  20 mEq Oral TID  . sodium chloride flush  10-40 mL Intracatheter Q12H  . sodium chloride flush  3 mL Intravenous Q12H  . thiamine  100 mg Oral Daily  . warfarin  7.5 mg Oral ONCE-1800  . Warfarin - Physician Dosing Inpatient   Does not apply q1800    Infusions: . sodium chloride Stopped (08/04/18 1055)  . sodium chloride    . sodium chloride Stopped (08/05/18 0728)  . lactated ringers Stopped (08/04/18 0715)  . lactated ringers 10 mL/hr at 08/07/18 0700    PRN Medications: sodium chloride, hydrALAZINE, Influenza vac split quadrivalent PF, morphine injection, nicotine, ondansetron (ZOFRAN) IV, oxyCODONE, sodium chloride flush, sodium chloride flush, traMADol    Patient Profile   Theodore Welch is a 57 y.o. 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  Admitted with worsening SOB and orthopnea in setting of severe biventricular CHF.   Assessment/Plan   1. Acute on chronic systolic CHF:  Nonischemic cardiomyopathy based on cath 7/19 in [place]. Remote heavy ETOH, says he has quit drinking completely for about 2 months now. HIV negative.  No cocaine/amphetamines.  Echo was done this admission with biventricular failure, LV EF 10-20% with dilated RV. There is moderate to severe MR and severe TR with poor coaptation of TV leaflets. On exam, he was markedly volume overloaded initially.  Low output HF indicated by low Co-ox of 43% initially.  cMRI 07/23/18 LVEF EF 14%, Severe RF dysfunction (EF 25%) with nonspecific RV insertion site LGE pattern likely from  pressure/volume overload. RHC showed good cardiac output on milrinone with filling pressures nearing normal.  PAPi 2.65 suggested that RV function is better than expected from imaging.  Echo on 9/10 after diuresis showed EF 20-25%, RV looked better, mild to moderate MR, moderate TR. 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 CO-OX 62% today, CVP 9-11.  MAP 80s-90s.  Currently on milrinone 0.125.  - Will give  1 dose IV Lasix this morning, probably will need po Lasix tomorrow.   - Stop milrinone.    - 325 mg ASA until INR 2.  - Coumadin, no heparin for now.  - Ramp echo Monday.   2. OSA: In the community on CPAP.    3. COPD:  He had mostly quit smoking before admission but still smoked a few cigarettes/week. PFTs 07/24/18 FVC 3.19 (79%), FEV1 2.47 (78%), DLCO 53%, TLC 82%.  - No change to current plan.   4. 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 ?component of ETOH cirrhosis given prior heavy ETOH (has now quit). RUQ US showed ascites with evidence for cirrhosis. NH3 with initial elevation at 64 on 9/3. CT abdomen showed resolution of ascites but there was evidence for cirrhosis. Bilirubin trended down after surgery.   5. RLE DVT:  He has a right peroneal vein (below knee) DVT.  Even though below knee, think he is a relatively high risk patient with severe cardiomyopathy.  He will be on anticoagulation for LVAD.   6. Valvular heart disease:  Moderate to severe MR and severe TR on initial echo.  Suspect this was functional due to annular dilation.  Repeat echo after diuresis on 9/10 showed mild-moderate MR, moderate TR.    7. VT: Run overnight 9/17 in setting of hypomagnesemia and aggressive diuresis.  No further VT.    Continue to mobilize today, doing well with LVAD teaching.    Marca Ancona, MD  08/07/2018 8:37 AM

## 2018-08-07 NOTE — Progress Notes (Signed)
Physical Therapy Treatment Patient Details Name: Theodore Welch MRN: [id number] DOB: [birth year] Today's Date: 08/07/2018    History of Present Illness Pt is a 57 y.o. male admitted 07/21/18 with worsening SOB and orthopnea in setting of severe biventricular CHF. Now s/p LVAD on 9/16. PMH includes CHF, DDD, chronic back pain.    PT Comments    Pt pleasant on arrival, talking on phone with elbows up and out. Pt educated on all sternal precautions with frequent cues for elbows to remain in,  increased carryover through session with recall and demonstration, however further reinforcement required. Pt progressing nicely with 800 ft ambulation with use of Rolator with supervision for lines. Pt DOE 2/4 at end of walk. Pt encouraged to continue walking with nursing staff to increase activity tolerance. Pt able to transfer from wall power to batteries with only verbal cues. Needs mod cues for donning vest and adhering to precautions.   Vitals prior to session. Flow: 4.1 PI: 4.2 Power: 3.9 Speed: 5400 HR 106, BP 107/88 (95), RR 31, SpO2 92% (no post vitals as pt left in bathroom, claiming would be awhile) nurse present.        Follow Up Recommendations  Supervision for mobility/OOB;Other (comment)(Op cardiac rehab )     Equipment Recommendations  None recommended by PT    Recommendations for Other Services       Precautions / Restrictions Precautions Precautions: Sternal;Fall Precaution Comments: LVAD. Verbally reviewed sternal precautions Restrictions Weight Bearing Restrictions: Yes    Mobility  Bed Mobility               General bed mobility comments: in chair on arrival   Transfers Overall transfer level: Needs assistance Equipment used: 4-wheeled walker Transfers: Sit to/from Stand Sit to Stand: Supervision         General transfer comment: cues for hands on knees.   Ambulation/Gait Ambulation/Gait assistance: Supervision(supervision for lines ) Gait Distance  (Feet): 800 Feet Assistive device: 4-wheeled walker Gait Pattern/deviations: Step-through pattern;Decreased stride length;Trunk flexed Gait velocity: decreased  Gait velocity interpretation: 1.31 - 2.62 ft/sec, indicative of limited community ambulator General Gait Details: cues to look up with ambulation. pt steady with some feet drag. no rest break, slight SOB at end. VSS    Stairs             Wheelchair Mobility    Modified Rankin (Stroke Patients Only)       Balance Overall balance assessment: Modified Independent Sitting-balance support: No upper extremity supported;Feet supported Sitting balance-Leahy Scale: Good     Standing balance support: Bilateral upper extremity supported Standing balance-Leahy Scale: Fair Standing balance comment: can stand without support, but with movement bil UE reliance on rollator                             Cognition Arousal/Alertness: Awake/alert Behavior During Therapy: WFL for tasks assessed/performed Overall Cognitive Status: Within Functional Limits for tasks assessed                                 General Comments: decreased recall of precautions, early session remembered no push, carryover through session for all but stay in the tube, pt with repeated verbal and tactile cues to keep elbows in.       Exercises      General Comments        Pertinent Vitals/Pain Pain Assessment:  No/denies pain    Home Living                      Prior Function            PT Goals (current goals can now be found in the care plan section) Progress towards PT goals: Progressing toward goals    Frequency    Min 3X/week      PT Plan Current plan remains appropriate    Co-evaluation              AM-PAC PT "6 Clicks" Daily Activity  Outcome Measure  Difficulty turning over in bed (including adjusting bedclothes, sheets and blankets)?: A Little Difficulty moving from lying on back to  sitting on the side of the bed? : A Lot Difficulty sitting down on and standing up from a chair with arms (e.g., wheelchair, bedside commode, etc,.)?: None Help needed moving to and from a bed to chair (including a wheelchair)?: A Little Help needed walking in hospital room?: A Little Help needed climbing 3-5 steps with a railing? : A Little 6 Click Score: 18    End of Session Equipment Utilized During Treatment: Gait belt Activity Tolerance: Patient tolerated treatment well Patient left: Other (comment)(pt left on toilet with instruction to call nurse prior to standing. nurse aware. ) Nurse Communication: Mobility status;Patient requests pain meds PT Visit Diagnosis: Other abnormalities of gait and mobility (R26.89)     Time: 0900-0930 PT Time Calculation (min) (ACUTE ONLY): 30 min  Charges:  $Gait Training: 8-22 mins $Therapeutic Activity: 8-22 mins                     Carma Lair, [place]  [facility] [phone]    Carma Lair 08/07/2018, 9:58 AM

## 2018-08-07 NOTE — Progress Notes (Signed)
Patient ID: Theodore Welch, male   DOB: [birth year], 57 y.o.   MRN: [id number] HeartMate 3 Rounding Note  Subjective:    Feels well. Ambulating well, pain controlled. Bowels moving now after laxative last night.  MAP 90's CVP 11 milrinone 0.125  LVAD INTERROGATION:  HeartMate IIl LVAD:  Flow 4.2 liters/min, speed 5400, power 4, PI 3.8.    Objective:    Vital Signs:   Temp:  [98.2 F (36.8 C)-99.7 F (37.6 C)] 99.7 F (37.6 C) (09/20 0400) Pulse Rate:  [34-124] 117 (09/20 0600) Resp:  [20-32] 24 (09/20 0600) BP: (77-111)/(50-94) 105/92 (09/20 0600) SpO2:  [92 %-100 %] 100 % (09/19 1900) Weight:  [67.8 kg] 67.8 kg (09/20 0500) Last BM Date: 08/02/18 Mean arterial Pressure 90's  Intake/Output:   Intake/Output Summary (Last 24 hours) at 08/07/2018 0737 Last data filed at 08/07/2018 0700 Gross per 24 hour  Intake 1834.34 ml  Output 477 ml  Net 1357.34 ml     Physical Exam: General:  Well appearing. No resp difficulty HEENT: normal Cor: occasional heart sounds with LVAD hum present. Lungs: decreased left base Chest dressing dry Abdomen: soft, nontender, nondistended. No hepatosplenomegaly. No bruits or masses. Good bowel sounds. Minimal drainage on exit site dressing. Extremities: no cyanosis, clubbing, rash, edema Neuro: alert & orientedx3, cranial nerves grossly intact. moves all 4 extremities w/o difficulty. Affect pleasant  Telemetry: sinus 90-100's  Labs: Basic Metabolic Panel: Recent Labs  Lab 08/03/18 1317  08/04/18 0311 08/04/18 1612 08/04/18 1622 08/05/18 0252 08/05/18 0746 08/06/18 0500 08/07/18 0536  NA 142   < > 141  --  138 137  --  136 139  K 4.3   < > 4.5 3.9 3.9 4.6  --  4.2 4.1  CL 105   < > 107  --  101 101  --  101 103  CO2 27  --  27  --   --  26  --  24 25  GLUCOSE 109*   < > 103*  --  116* 109*  --  145* 103*  BUN 19   < > 14  --  12 13  --  10 11  CREATININE 0.70   < > 0.62 0.56* 0.50* 0.51*  --  0.55* 0.54*  CALCIUM 9.0  --  9.8  --    --  9.8  --  9.8 9.8  MG 1.7   < > 2.3 1.8  --  0.9* 2.1 1.9 1.8  PHOS  --   --  4.4  --   --  3.4  --  2.9 3.8   < > = values in this interval not displayed.    Liver Function Tests: Recent Labs  Lab 08/01/18 0500 08/04/18 0311 08/05/18 0252 08/06/18 0500  AST 26 64* 55* 30  ALT 22 18 20 17   ALKPHOS 103 66 67 70  BILITOT 1.9* 2.5* 2.3* 1.9*  PROT 6.5 6.1* 5.9* 6.1*  ALBUMIN 3.4* 3.6 3.7 3.3*   No results for input(s): LIPASE, AMYLASE in the last 168 hours. No results for input(s): AMMONIA in the last 168 hours.  CBC: Recent Labs  Lab 08/04/18 0311 08/04/18 1612 08/04/18 1622 08/05/18 0252 08/05/18 0519 08/06/18 0500 08/07/18 0536  WBC 12.7* 16.1*  --  15.2* 15.7* 15.7* 12.4*  NEUTROABS 9.5*  --   --  9.9* 11.3* 11.3* 8.7*  HGB 8.3* 9.1* 10.2* 8.5* 8.3* 8.2* 7.8*  HCT 26.6* 28.7* 30.0* 26.3* 25.7* 25.3* 24.3*  MCV 88.7 89.7  --  89.2 88.0 88.2 88.7  PLT 114* 144*  --  146* 131* 140* 182    INR: Recent Labs  Lab 08/03/18 1317 08/04/18 0311 08/05/18 0252 08/06/18 0500 08/07/18 0536  INR 1.33 1.14 1.28 1.29 1.28    Other results:  EKG:   Imaging: Dg Chest Port 1 View  Result Date: 08/06/2018 CLINICAL DATA:  Chest soreness, LVAD EXAM: PORTABLE CHEST 1 VIEW COMPARISON:  Portable chest x-ray of 08/05/2018 and 08/04/2017 FINDINGS: In the interval, the left chest tube has been removed and no left pneumothorax is seen. Right central venous line also has been removed and a PICC line is present from the right with the tip overlying the mid lower SVC. No pneumothorax is evident. Cardiomegaly is stable and there may be mild pulmonary vascular congestion present. LVAD remains. IMPRESSION: 1. Left chest tube removed.  No pneumothorax. 2. Right IJ central venous line removed with right PICC line tip overlying the mid lower SVC. No pneumothorax. 3. Stable cardiomegaly with mild pulmonary vascular congestion and left ventricular assist device remains. Electronically Signed    By: Dwyane Dee M.D.   On: 08/06/2018 09:48   [name] Ekg Site Rite  Result Date: 08/05/2018 If Site Rite image not attached, placement could not be confirmed due to current cardiac rhythm.    Medications:     Scheduled Medications: . acetaminophen  1,000 mg Oral Q6H   Or  . acetaminophen (TYLENOL) oral liquid 160 mg/5 mL  1,000 mg Per Tube Q6H  . aspirin EC  325 mg Oral Daily   Or  . aspirin  324 mg Per Tube Daily   Or  . aspirin  300 mg Rectal Daily  . bisacodyl  10 mg Oral Daily   Or  . bisacodyl  10 mg Rectal Daily  . Chlorhexidine Gluconate Cloth  6 each Topical Daily  . digoxin  0.125 mg Oral Daily  . docusate sodium  200 mg Oral Daily  . DULoxetine  60 mg Oral Daily  . feeding supplement (ENSURE ENLIVE)  237 mL Oral BID BM  . ferrous fumarate-b12-vitamic C-folic acid  1 capsule Oral TID PC  . gabapentin  600 mg Oral TID  . insulin aspart  0-24 Units Subcutaneous TID WC  . pantoprazole  40 mg Oral Daily  . sodium chloride flush  10-40 mL Intracatheter Q12H  . sodium chloride flush  3 mL Intravenous Q12H  . thiamine  100 mg Oral Daily  . warfarin  7.5 mg Oral ONCE-1800  . Warfarin - Physician Dosing Inpatient   Does not apply q1800    Infusions: . sodium chloride Stopped (08/04/18 1055)  . sodium chloride    . sodium chloride Stopped (08/05/18 0728)  . lactated ringers Stopped (08/04/18 0715)  . lactated ringers 10 mL/hr at 08/07/18 0700  . milrinone 0.125 mcg/kg/min (08/07/18 0700)    PRN Medications: sodium chloride, hydrALAZINE, Influenza vac split quadrivalent PF, morphine injection, nicotine, ondansetron (ZOFRAN) IV, oxyCODONE, sodium chloride flush, sodium chloride flush, traMADol   Assessment/Plan/Discussion:     POD 4 s/p Heartmate III for nonischemic cardiomyopathy with EF 10-20% on admission with dilated and hypokinetic RV, moderate to severe MR and severe TR. He tuned up nicely preop with milrinone and diuresis with reduction of CVP to normal,  moderate MR and mild to moderate TR.  Hemodynamics look good on milrinone 0.125.  Co-ox is pending this am. If ok can probably DC milrinone.    INR has not bumped yet. Coumadin 7.5 mg  tonight. Continue ASA. I would not start IV heparin POD 4 with some bleeding around drive line site.  He had a right peroneal DVT preop.  I/O + 1300 cc yesterday and weight up slightly. Will give a dose of lasix this am and could probably put on oral diuretic.   DM: glucose under adequate control. Continue SSI AC/HS.  IS, OOB, ambulate.  Transfer to [facility].   I reviewed the LVAD parameters from today, and compared the results to the patient's prior recorded data.  No programming changes were made.  The LVAD is functioning within specified parameters.  The patient performs LVAD self-test daily.  LVAD interrogation was negative for any significant power changes, alarms or PI events/speed drops.  LVAD equipment check completed and is in good working order.  Back-up equipment present.   LVAD education done on emergency procedures and precautions and reviewed exit site care.  Length of Stay: [address]  Payton Doughty [facility] 08/07/2018, 7:37 AM

## 2018-08-08 LAB — BASIC METABOLIC PANEL
ANION GAP: 9 (ref 5–15)
BUN: 12 mg/dL (ref 6–20)
CHLORIDE: 104 mmol/L (ref 98–111)
CO2: 27 mmol/L (ref 22–32)
CREATININE: 0.59 mg/dL — AB (ref 0.61–1.24)
Calcium: 9.6 mg/dL (ref 8.9–10.3)
GFR calc Af Amer: >60 mL/min (ref >60)
GFR calc non Af Amer: >60 mL/min (ref >60)
GLUCOSE: 95 mg/dL (ref 70–99)
Potassium: 4.1 mmol/L (ref 3.5–5.1)
Sodium: 140 mmol/L (ref 135–145)

## 2018-08-08 LAB — CBC WITH DIFFERENTIAL/PLATELET
ABS IMMATURE GRANULOCYTES: 0.1 10*3/uL (ref 0.0–0.1)
BASOS ABS: 0.1 10*3/uL (ref 0.0–0.1)
Basophils Relative: 1 %
Eosinophils Absolute: 0.5 10*3/uL (ref 0.0–0.7)
Eosinophils Relative: 4 %
HEMATOCRIT: 22.6 % — AB (ref 39.0–52.0)
HEMOGLOBIN: 7.2 g/dL — AB (ref 13.0–17.0)
Immature Granulocytes: 1 %
LYMPHS ABS: 1.3 10*3/uL (ref 0.7–4.0)
LYMPHS PCT: 12 %
MCH: 28.1 pg (ref 26.0–34.0)
MCHC: 31.9 g/dL (ref 30.0–36.0)
MCV: 88.3 fL (ref 78.0–100.0)
Monocytes Absolute: 2.1 10*3/uL — ABNORMAL HIGH (ref 0.1–1.0)
Monocytes Relative: 20 %
NEUTROS ABS: 6.7 10*3/uL (ref 1.7–7.7)
Neutrophils Relative %: 62 %
Platelets: 232 10*3/uL (ref 150–400)
RBC: 2.56 MIL/uL — AB (ref 4.22–5.81)
RDW: 15.6 % — ABNORMAL HIGH (ref 11.5–15.5)
WBC: 10.7 10*3/uL — AB (ref 4.0–10.5)

## 2018-08-08 LAB — PREPARE RBC (CROSSMATCH)

## 2018-08-08 LAB — GLUCOSE, CAPILLARY
GLUCOSE-CAPILLARY: 74 mg/dL (ref 70–99)
GLUCOSE-CAPILLARY: 140 mg/dL — AB (ref 70–99)
Glucose-Capillary: 117 mg/dL — ABNORMAL HIGH (ref 70–99)
Glucose-Capillary: 91 mg/dL (ref 70–99)

## 2018-08-08 LAB — COOXEMETRY PANEL
Carboxyhemoglobin: 2.2 % — ABNORMAL HIGH (ref 0.5–1.5)
Methemoglobin: 1.6 % — ABNORMAL HIGH (ref 0.0–1.5)
O2 SAT: 66.6 %
Total hemoglobin: 7.6 g/dL — ABNORMAL LOW (ref 12.0–16.0)

## 2018-08-08 LAB — PROTIME-INR
INR: 1.46
PROTHROMBIN TIME: 17.6 s — AB (ref 11.4–15.2)

## 2018-08-08 LAB — MAGNESIUM: Magnesium: 1.8 mg/dL (ref 1.7–2.4)

## 2018-08-08 LAB — PHOSPHORUS: Phosphorus: 4.4 mg/dL (ref 2.5–4.6)

## 2018-08-08 LAB — LACTATE DEHYDROGENASE: LDH: 225 U/L — ABNORMAL HIGH (ref 98–192)

## 2018-08-08 MED ORDER — SODIUM CHLORIDE 0.9% IV SOLUTION
Freq: Once | INTRAVENOUS | Status: AC
  Administered 2018-08-08: 10 mL/h via INTRAVENOUS

## 2018-08-08 MED ORDER — FUROSEMIDE 40 MG PO TABS: 40.0000 mg | ORAL_TABLET | Freq: Every day | ORAL | Status: DC

## 2018-08-08 MED ORDER — WARFARIN SODIUM 7.5 MG PO TABS
7.5000 mg | ORAL_TABLET | Freq: Once | ORAL | Status: AC
  Administered 2018-08-08: 7.5 mg via ORAL
  Filled 2018-08-08: qty 1

## 2018-08-08 NOTE — Progress Notes (Signed)
LVAD dressing changed using sterile technique by RN and patient's significant other. Driveline and anchors intact, no complications.

## 2018-08-08 NOTE — Progress Notes (Signed)
Patient ID: Theodore Welch, male   DOB: August 07, 1961, 57 y.o.   MRN: [id number] HeartMate 3 Rounding Note  Subjective:    Feels well. Ambulating well, pain controlled. Bowels moving, hungry  MAP 84 CVP 10   LVAD INTERROGATION:  HeartMate IIl LVAD:  Flow 4.1 liters/min, speed 5400, power 4, PI 4.3  Several PI events overnight.    Objective:    Vital Signs:   Temp:  [97.6 F (36.4 C)-98.5 F (36.9 C)] 97.9 F (36.6 C) (09/21 0753) Pulse Rate:  [79-93] 92 (09/21 0345) Resp:  [21-36] 26 (09/21 0753) BP: (76-100)/(47-84) 100/74 (09/21 0755) SpO2:  [97 %-98 %] 97 % (09/21 0753) Weight:  [64.7 kg] 64.7 kg (09/21 0659) Last BM Date: 08/07/18 Mean arterial Pressure 80's  Intake/Output:   Intake/Output Summary (Last 24 hours) at 08/08/2018 0925 Last data filed at 08/08/2018 0700 Gross per 24 hour  Intake 1364.24 ml  Output 2600 ml  Net -1235.76 ml     Physical Exam: General:  Well appearing. No resp difficulty HEENT: normal Cor: occasional heart sounds with LVAD hum present. Lungs: decreased left base Chest dressing dry Abdomen: soft, nontender, nondistended. No hepatosplenomegaly. No bruits or masses. Good bowel sounds. Minimal drainage on exit site dressing. Extremities: no cyanosis, clubbing, rash, edema Neuro: alert & orientedx3, cranial nerves grossly intact. moves all 4 extremities w/o difficulty. Affect pleasant  Telemetry: sinus 90-100's  Labs: Basic Metabolic Panel: Recent Labs  Lab 08/04/18 0311  08/04/18 1622 08/05/18 0252 08/05/18 0746 08/06/18 0500 08/07/18 0536 08/08/18 0500  NA 141  --  138 137  --  136 139 140  K 4.5   < > 3.9 4.6  --  4.2 4.1 4.1  CL 107  --  101 101  --  101 103 104  CO2 27  --   --  26  --  24 25 27   GLUCOSE 103*  --  116* 109*  --  145* 103* 95  BUN 14  --  12 13  --  10 11 12   CREATININE 0.62   < > 0.50* 0.51*  --  0.55* 0.54* 0.59*  CALCIUM 9.8  --   --  9.8  --  9.8 9.8 9.6  MG 2.3   < >  --  0.9* 2.1 1.9 1.8 1.8  PHOS  4.4  --   --  3.4  --  2.9 3.8 4.4   < > = values in this interval not displayed.    Liver Function Tests: Recent Labs  Lab 08/04/18 0311 08/05/18 0252 08/06/18 0500  AST 64* 55* 30  ALT 18 20 17   ALKPHOS 66 67 70  BILITOT 2.5* 2.3* 1.9*  PROT 6.1* 5.9* 6.1*  ALBUMIN 3.6 3.7 3.3*   No results for input(s): LIPASE, AMYLASE in the last 168 hours. No results for input(s): AMMONIA in the last 168 hours.  CBC: Recent Labs  Lab 08/05/18 0252 08/05/18 0519 08/06/18 0500 08/07/18 0536 08/08/18 0500  WBC 15.2* 15.7* 15.7* 12.4* 10.7*  NEUTROABS 9.9* 11.3* 11.3* 8.7* 6.7  HGB 8.5* 8.3* 8.2* 7.8* 7.2*  HCT 26.3* 25.7* 25.3* 24.3* 22.6*  MCV 89.2 88.0 88.2 88.7 88.3  PLT 146* 131* 140* 182 232    INR: Recent Labs  Lab 08/04/18 0311 08/05/18 0252 08/06/18 0500 08/07/18 0536 08/08/18 0500  INR 1.14 1.28 1.29 1.28 1.46    Other results:  EKG:   Imaging: No results found.   Medications:     Scheduled Medications: .  acetaminophen  1,000 mg Oral Q6H   Or  . acetaminophen (TYLENOL) oral liquid 160 mg/5 mL  1,000 mg Per Tube Q6H  . bisacodyl  10 mg Oral Daily   Or  . bisacodyl  10 mg Rectal Daily  . digoxin  0.125 mg Oral Daily  . docusate sodium  200 mg Oral Daily  . DULoxetine  60 mg Oral Daily  . feeding supplement (ENSURE ENLIVE)  237 mL Oral BID BM  . ferrous fumarate-b12-vitamic C-folic acid  1 capsule Oral TID PC  . gabapentin  600 mg Oral TID  . insulin aspart  0-24 Units Subcutaneous TID WC  . losartan  25 mg Oral Daily  . pantoprazole  40 mg Oral Daily  . sodium chloride flush  10-40 mL Intracatheter Q12H  . sodium chloride flush  3 mL Intravenous Q12H  . thiamine  100 mg Oral Daily  . warfarin  7.5 mg Oral ONCE-1800  . Warfarin - Physician Dosing Inpatient   Does not apply q1800    Infusions: . sodium chloride Stopped (08/04/18 1055)  . sodium chloride    . sodium chloride Stopped (08/05/18 0728)  . lactated ringers Stopped (08/04/18 0715)   . lactated ringers 10 mL/hr at 08/07/18 1200    PRN Medications: sodium chloride, hydrALAZINE, Influenza vac split quadrivalent PF, morphine injection, nicotine, ondansetron (ZOFRAN) IV, oxyCODONE, sodium chloride flush, sodium chloride flush, traMADol   Assessment/Plan/Discussion:     POD 5 s/p Heartmate III for nonischemic cardiomyopathy with EF 10-20% on admission with dilated and hypokinetic RV, moderate to severe MR and severe TR. He tuned up nicely preop with milrinone and diuresis with reduction of CVP to normal, moderate MR and mild to moderate TR.  Hemodynamics look good off milrinone since yesterday  Co-ox 67 this am.   INR has bumped to 1.46. Coumadin 7.5 mg tonight. Continue ASA. I think pharmacy can start dosing Coumadin after today.  I/O -970 cc yesterday and weight down. Not sure that 7 lbs is accurate but weight is about 4 lbs over preop. Oral diuretic per heart failure team.  DM: glucose under good control. He was on Metformin preop and Hgb A1c was 6.2 with bad heart failure so he may not need any meds now.   Expected acute postop blood loss anemia: Hgb trend is downward to 7.2 this am. No visible blood loss except around drive line a few days ago and blood draws. He is on iron replacement. Trying to avoid transfusion but may need to if it continues to drop.  IS, OOB, ambulate.  Teaching is going well.    I reviewed the LVAD parameters from today, and compared the results to the patient's prior recorded data.  No programming changes were made.  The LVAD is functioning within specified parameters.  The patient performs LVAD self-test daily.  LVAD interrogation was negative for any significant power changes, alarms or PI events/speed drops.  LVAD equipment check completed and is in good working order.  Back-up equipment present.   LVAD education done on emergency procedures and precautions and reviewed exit site care.  Length of Stay: [address]  Payton Doughty [facility] 08/08/2018,  9:25 AM

## 2018-08-08 NOTE — Progress Notes (Signed)
Pt sleeping now. Spoke with RN, he is quite ambulatory and she will walk with him later. Will f/u Monday unless we have time to check back later today. Ethelda Chick CES, ACSM 11:56 AM 08/08/2018

## 2018-08-08 NOTE — Progress Notes (Addendum)
Patient ID: Theodore Welch, male   DOB: Mar 30, 1961, 57 y.o.   MRN: [id number]     Advanced Heart Failure Rounding Note  PCP-Cardiologist: No primary care provider on file.   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  Co-ox 67%. Feels ok. Tired at times. Denies SOB. Had BM yesterday. Having some pocket pain. Improved with gabapentin.   Diuresed well on IV lasix Weight down 7 pounds. Hgb 7.2. INR 1.4  MAP 80-90   HMIII Speed 5400 Flow 4.1 Power 4.0 PI 4.3.  Objective:    Weight Range: 64.7 kg Body mass index is 20.47 kg/m.   Vital Signs:   Temp:  [97.6 F (36.4 C)-98.5 F (36.9 C)] 98 F (36.7 C) (09/21 1100) Pulse Rate:  [79-104] 104 (09/21 1100) Resp:  [21-36] 34 (09/21 1100) BP: (76-104)/(47-88) 104/88 (09/21 1100) SpO2:  [97 %-100 %] 100 % (09/21 1100) Weight:  [64.7 kg] 64.7 kg (09/21 0659) Last BM Date: 08/07/18  Weight change: Filed Weights   08/06/18 0500 08/07/18 0500 08/08/18 0659  Weight: 66.8 kg 67.8 kg 64.7 kg   Intake/Output:   Intake/Output Summary (Last 24 hours) at 08/08/2018 1221 Last data filed at 08/08/2018 1100 Gross per 24 hour  Intake 1440 ml  Output 1400 ml  Net 40 ml    Physical Exam   General:  NAD.  HEENT: normal  Neck: supple. JVP not elevated.  Carotids 2+ bilat; no bruits. No lymphadenopathy or thryomegaly appreciated. Cor: sternal wound healed nicely LVAD hum.  Lungs: Clear. Abdomen: soft, nontender, non-distended. No hepatosplenomegaly. No bruits or masses. Good bowel sounds. Driveline site clean. Anchor in place.  Extremities: no cyanosis, clubbing, rash. Warm no edema  Neuro: alert & oriented x 3. No focal deficits. Moves all 4 without problem     Telemetry   Sinus tachy 100-105, personally reviewed.   EKG    Sinus tachy  Labs    CBC Recent Labs    08/07/18 0536 08/08/18 0500  WBC 12.4* 10.7*  NEUTROABS 8.7* 6.7  HGB 7.8* 7.2*  HCT 24.3* 22.6*  MCV 88.7 88.3  PLT 182  232   Basic Metabolic Panel Recent Labs    40/98/11 0536 08/08/18 0500  NA 139 140  K 4.1 4.1  CL 103 104  CO2 25 27  GLUCOSE 103* 95  BUN 11 12  CREATININE 0.54* 0.59*  CALCIUM 9.8 9.6  MG 1.8 1.8  PHOS 3.8 4.4   Liver Function Tests Recent Labs    08/06/18 0500  AST 30  ALT 17  ALKPHOS 70  BILITOT 1.9*  PROT 6.1*  ALBUMIN 3.3*   No results for input(s): LIPASE, AMYLASE in the last 72 hours. Cardiac Enzymes No results for input(s): CKTOTAL, CKMB, CKMBINDEX, TROPONINI in the last 72 hours.  BNP: BNP (last 3 results) Recent Labs    07/21/18 0540 08/04/18 0902  BNP 1,343.9* 658.6*    ProBNP (last 3 results) No results for input(s): PROBNP in the last 8760 hours.   D-Dimer No results for input(s): DDIMER in the last 72 hours. Hemoglobin A1C No results for input(s): HGBA1C in the last 72 hours. Fasting Lipid Panel No results for input(s): CHOL, HDL, LDLCALC, TRIG, CHOLHDL, LDLDIRECT in the last 72 hours. Thyroid Function Tests No results for input(s): TSH, T4TOTAL, T3FREE, THYROIDAB in the last 72 hours.  Invalid input(s): FREET3  Other results:   Imaging    No results found.  Medications:     Scheduled Medications: . acetaminophen  1,000 mg Oral Q6H   Or  . acetaminophen (TYLENOL) oral liquid 160 mg/5 mL  1,000 mg Per Tube Q6H  . bisacodyl  10 mg Oral Daily   Or  . bisacodyl  10 mg Rectal Daily  . digoxin  0.125 mg Oral Daily  . docusate sodium  200 mg Oral Daily  . DULoxetine  60 mg Oral Daily  . feeding supplement (ENSURE ENLIVE)  237 mL Oral BID BM  . ferrous fumarate-b12-vitamic C-folic acid  1 capsule Oral TID PC  . gabapentin  600 mg Oral TID  . insulin aspart  0-24 Units Subcutaneous TID WC  . losartan  25 mg Oral Daily  . pantoprazole  40 mg Oral Daily  . sodium chloride flush  10-40 mL Intracatheter Q12H  . sodium chloride flush  3 mL Intravenous Q12H  . thiamine  100 mg Oral Daily  . warfarin  7.5 mg Oral ONCE-1800  .  Warfarin - Physician Dosing Inpatient   Does not apply q1800    Infusions: . sodium chloride Stopped (08/04/18 1055)  . sodium chloride    . sodium chloride Stopped (08/05/18 0728)  . lactated ringers Stopped (08/04/18 0715)  . lactated ringers 10 mL/hr at 08/07/18 1200    PRN Medications: sodium chloride, hydrALAZINE, Influenza vac split quadrivalent PF, morphine injection, nicotine, ondansetron (ZOFRAN) IV, oxyCODONE, sodium chloride flush, sodium chloride flush, traMADol    Patient Profile   Theodore Welch is a 57 y.o. 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  Admitted with worsening SOB and orthopnea in setting of severe biventricular CHF.   Assessment/Plan   1. Acute on chronic systolic CHF:  Nonischemic cardiomyopathy based on cath 7/19 in [place]. Remote heavy ETOH, says he has quit drinking completely for about 2 months now. HIV negative.  No cocaine/amphetamines.  Echo was done this admission with biventricular failure, LV EF 10-20% with dilated RV. There is moderate to severe MR and severe TR with poor coaptation of TV leaflets. On exam, he was markedly volume overloaded initially.  Low output HF indicated by low Co-ox of 43% initially.  cMRI 07/23/18 LVEF EF 14%, Severe RF dysfunction (EF 25%) with nonspecific RV insertion site LGE pattern likely from pressure/volume overload. RHC showed good cardiac output on milrinone with filling pressures nearing normal.  PAPi 2.65 suggested that RV function is better than expected from imaging.  Echo on 9/10 after diuresis showed EF 20-25%, RV looked better, mild to moderate MR, moderate TR. 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 CO-OX 62% today, CVP 9-11.  MAP 80s-90s.  Milrinone off co-ox 67% - weight down to 142 (was 139 pre-op).. Will switch to po lasix 40 daily.      - 325 mg ASA until INR 2.  - Coumadin, no heparin for now. INR 1.42. Discussed dosing with PharmD  personally. - Ramp echo Monday.   2. OSA: In the community on CPAP.    3. COPD:  He had mostly quit smoking before admission but still smoked a few cigarettes/week. PFTs 07/24/18 FVC 3.19 (79%), FEV1 2.47 (78%), DLCO 53%, TLC 82%.  - No change to current plan.   4. 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 ?component of ETOH cirrhosis given prior heavy ETOH (has now quit). RUQ US showed ascites with evidence for cirrhosis. NH3 with initial elevation at 64 on 9/3. CT abdomen showed  resolution of ascites but there was evidence for cirrhosis. Bilirubin trended down after surgery.   5. RLE DVT:  He has a right peroneal vein (below knee) DVT.  Even though below knee, think he is a relatively high risk patient with severe cardiomyopathy.  He is on anticoagulation for LVAD.   6. Valvular heart disease:  Moderate to severe MR and severe TR on initial echo.  Suspect this was functional due to annular dilation.  Repeat echo after diuresis on 9/10 showed mild-moderate MR, moderate TR.    7. VT: Run overnight 9/17 in setting of hypomagnesemia and aggressive diuresis.  No further VT.  Keep K> 4.0 MG > 2.0  8. Post-op anemia - hgb down to 7.2 Will give 1u RBCs  Continue to mobilize and educate.    Arvilla Meres, MD  08/08/2018 12:21 PM

## 2018-08-09 LAB — TYPE AND SCREEN
ABO/RH(D): O POS
ANTIBODY SCREEN: NEGATIVE
UNIT DIVISION: 0

## 2018-08-09 LAB — BASIC METABOLIC PANEL
Anion gap: 11 (ref 5–15)
BUN: 13 mg/dL (ref 6–20)
CHLORIDE: 104 mmol/L (ref 98–111)
CO2: 25 mmol/L (ref 22–32)
CREATININE: 0.55 mg/dL — AB (ref 0.61–1.24)
Calcium: 9.3 mg/dL (ref 8.9–10.3)
GFR calc Af Amer: >60 mL/min (ref >60)
GFR calc non Af Amer: >60 mL/min (ref >60)
Glucose, Bld: 90 mg/dL (ref 70–99)
POTASSIUM: 4.2 mmol/L (ref 3.5–5.1)
Sodium: 140 mmol/L (ref 135–145)

## 2018-08-09 LAB — CBC WITH DIFFERENTIAL/PLATELET
Abs Immature Granulocytes: 0.3 10*3/uL — ABNORMAL HIGH (ref 0.0–0.1)
BASOS ABS: 0.1 10*3/uL (ref 0.0–0.1)
BASOS PCT: 1 %
EOS ABS: 0.5 10*3/uL (ref 0.0–0.7)
EOS PCT: 5 %
HCT: 26.5 % — ABNORMAL LOW (ref 39.0–52.0)
Hemoglobin: 8.7 g/dL — ABNORMAL LOW (ref 13.0–17.0)
IMMATURE GRANULOCYTES: 3 %
Lymphocytes Relative: 16 %
Lymphs Abs: 1.6 10*3/uL (ref 0.7–4.0)
MCH: 28.9 pg (ref 26.0–34.0)
MCHC: 32.8 g/dL (ref 30.0–36.0)
MCV: 88 fL (ref 78.0–100.0)
Monocytes Absolute: 2.2 10*3/uL — ABNORMAL HIGH (ref 0.1–1.0)
Monocytes Relative: 22 %
NEUTROS PCT: 53 %
Neutro Abs: 5.5 10*3/uL (ref 1.7–7.7)
PLATELETS: 250 10*3/uL (ref 150–400)
RBC: 3.01 MIL/uL — AB (ref 4.22–5.81)
RDW: 15.3 % (ref 11.5–15.5)
WBC: 10.2 10*3/uL (ref 4.0–10.5)

## 2018-08-09 LAB — GLUCOSE, CAPILLARY
GLUCOSE-CAPILLARY: 103 mg/dL — AB (ref 70–99)
GLUCOSE-CAPILLARY: 117 mg/dL — AB (ref 70–99)
Glucose-Capillary: 109 mg/dL — ABNORMAL HIGH (ref 70–99)
Glucose-Capillary: 95 mg/dL (ref 70–99)

## 2018-08-09 LAB — COOXEMETRY PANEL
Carboxyhemoglobin: 2 % — ABNORMAL HIGH (ref 0.5–1.5)
Methemoglobin: 0.9 % (ref 0.0–1.5)
O2 Saturation: 61.1 %
Total hemoglobin: 11.4 g/dL — ABNORMAL LOW (ref 12.0–16.0)

## 2018-08-09 LAB — BPAM RBC
BLOOD PRODUCT EXPIRATION DATE: 201910172359
ISSUE DATE / TIME: 201909211424
UNIT TYPE AND RH: 5100

## 2018-08-09 LAB — MAGNESIUM: Magnesium: 2 mg/dL (ref 1.7–2.4)

## 2018-08-09 LAB — PROTIME-INR
INR: 1.63
Prothrombin Time: 19.2 seconds — ABNORMAL HIGH (ref 11.4–15.2)

## 2018-08-09 LAB — LACTATE DEHYDROGENASE: LDH: 224 U/L — AB (ref 98–192)

## 2018-08-09 LAB — PHOSPHORUS: PHOSPHORUS: 4.1 mg/dL (ref 2.5–4.6)

## 2018-08-09 MED ORDER — ENSURE ENLIVE PO LIQD: 237.0000 mL | Freq: Every day | ORAL | Status: DC | PRN

## 2018-08-09 MED ORDER — WARFARIN - PHARMACIST DOSING INPATIENT
Freq: Every day | Status: DC
  Administered 2018-08-11: 1

## 2018-08-09 MED ORDER — ASPIRIN EC 81 MG PO TBEC
81.0000 mg | DELAYED_RELEASE_TABLET | Freq: Every day | ORAL | Status: DC
  Administered 2018-08-09 – 2018-08-12 (×4): 81 mg via ORAL
  Filled 2018-08-09 (×4): qty 1

## 2018-08-09 MED ORDER — WARFARIN SODIUM 7.5 MG PO TABS
7.5000 mg | ORAL_TABLET | Freq: Once | ORAL | Status: AC
  Administered 2018-08-09: 7.5 mg via ORAL
  Filled 2018-08-09: qty 1

## 2018-08-09 MED ORDER — FUROSEMIDE 10 MG/ML IJ SOLN
40.0000 mg | Freq: Two times a day (BID) | INTRAMUSCULAR | Status: AC
  Administered 2018-08-09 (×2): 40 mg via INTRAVENOUS
  Filled 2018-08-09 (×2): qty 4

## 2018-08-09 MED ORDER — FUROSEMIDE 10 MG/ML IJ SOLN: 40.0000 mg | Freq: Two times a day (BID) | INTRAMUSCULAR | Status: DC

## 2018-08-09 NOTE — Progress Notes (Signed)
Drive Line: Daily dressing w/silver strip.Existing VAD dressing removed and site care performed using sterile technique. Drive line exit site cleaned with Chlora prep applicators x 2, allowed to dry, and gauze dressing w/extra 4 x 4 along with silver strip re-applied. Exit site healing and unincorporated, the velour is fully implanted at exit site. 1 suture intact. Small dried scab noted on site, under silver strip. No active bleeding. No redness, tenderness, foul odor or rash noted. Patient's fiance watched dressing change. [place], Mitzi Hansen

## 2018-08-09 NOTE — Progress Notes (Signed)
Patient ID: Theodore Welch, male   DOB: [birth year], 59 y.o.   MRN: [id number]     Advanced Heart Failure Rounding Note  PCP-Cardiologist: No primary care provider on file.   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  Co-ox 61%.  Doing well today, walking, no dyspnea.  Pain better.   CVP 13 today.  He is on po Lasix.    1 unit PRBCs yesterday, appropriate increase in hgb to 8.7.  LDH 224.   MAP 70s-80s.   HMIII Speed 5400 Flow 4.1 Power 3.9 PI 4.5.  He had about 15 PI events/24 hrs.   Objective:    Weight Range: 66.6 kg Body mass index is 21.07 kg/m.   Vital Signs:   Temp:  [97.7 F (36.5 C)-99.2 F (37.3 C)] 98 F (36.7 C) (09/22 0747) Pulse Rate:  [100-106] 104 (09/22 0747) Resp:  [14-34] 23 (09/22 0747) BP: (83-108)/(72-95) 104/73 (09/22 0747) SpO2:  [95 %-100 %] 95 % (09/22 0747) Weight:  [66.6 kg] 66.6 kg (09/22 0412) Last BM Date: 08/08/18  Weight change: Filed Weights   08/07/18 0500 08/08/18 0659 08/09/18 0412  Weight: 67.8 kg 64.7 kg 66.6 kg   Intake/Output:   Intake/Output Summary (Last 24 hours) at 08/09/2018 0854 Last data filed at 08/09/2018 0700 Gross per 24 hour  Intake 1644 ml  Output 1475 ml  Net 169 ml    Physical Exam   General: Well appearing this am. NAD.  HEENT: Normal. Neck: Supple, JVP 12 cm. Carotids OK.  Cardiac:  Mechanical heart sounds with LVAD hum present.  Lungs:  CTAB, normal effort.  Abdomen:  NT, ND, no HSM. No bruits or masses. +BS  LVAD exit site: Well-healed and incorporated. Dressing dry and intact. No erythema or drainage. Stabilization device present and accurately applied. Driveline dressing changed daily per sterile technique. Extremities:  Warm and dry. No cyanosis, clubbing, rash, or edema.  Neuro:  Alert & oriented x 3. Cranial nerves grossly intact. Moves all 4 extremities w/o difficulty. Affect pleasant     Telemetry   Sinus tachy 100s, personally reviewed.   Labs    CBC Recent Labs    08/08/18 0500 08/09/18 0537  WBC 10.7* 10.2  NEUTROABS 6.7 5.5  HGB 7.2* 8.7*  HCT 22.6* 26.5*  MCV 88.3 88.0  PLT 232 250   Basic Metabolic Panel Recent Labs    40/98/11 0500 08/09/18 0537  NA 140 140  K 4.1 4.2  CL 104 104  CO2 27 25  GLUCOSE 95 90  BUN 12 13  CREATININE 0.59* 0.55*  CALCIUM 9.6 9.3  MG 1.8 2.0  PHOS 4.4 4.1   Liver Function Tests No results for input(s): AST, ALT, ALKPHOS, BILITOT, PROT, ALBUMIN in the last 72 hours. No results for input(s): LIPASE, AMYLASE in the last 72 hours. Cardiac Enzymes No results for input(s): CKTOTAL, CKMB, CKMBINDEX, TROPONINI in the last 72 hours.  BNP: BNP (last 3 results) Recent Labs    07/21/18 0540 08/04/18 0902  BNP 1,343.9* 658.6*    ProBNP (last 3 results) No results for input(s): PROBNP in the last 8760 hours.   D-Dimer No results for input(s): DDIMER in the last 72 hours. Hemoglobin A1C No results for input(s): HGBA1C in the last 72 hours. Fasting Lipid Panel No results for input(s): CHOL, HDL, LDLCALC, TRIG, CHOLHDL, LDLDIRECT in the last 72 hours. Thyroid Function Tests No results for input(s): TSH, T4TOTAL, T3FREE, THYROIDAB in  the last 72 hours.  Invalid input(s): FREET3  Other results:   Imaging    No results found.   Medications:     Scheduled Medications: . bisacodyl  10 mg Oral Daily   Or  . bisacodyl  10 mg Rectal Daily  . digoxin  0.125 mg Oral Daily  . docusate sodium  200 mg Oral Daily  . DULoxetine  60 mg Oral Daily  . feeding supplement (ENSURE ENLIVE)  237 mL Oral BID BM  . ferrous fumarate-b12-vitamic C-folic acid  1 capsule Oral TID PC  . furosemide  40 mg Intravenous BID  . gabapentin  600 mg Oral TID  . insulin aspart  0-24 Units Subcutaneous TID WC  . losartan  25 mg Oral Daily  . pantoprazole  40 mg Oral Daily  . thiamine  100 mg Oral Daily  . Warfarin - Physician Dosing Inpatient   Does not apply q1800    Infusions: . lactated  ringers Stopped (08/04/18 0715)  . lactated ringers 10 mL/hr at 08/07/18 1200    PRN Medications: hydrALAZINE, Influenza vac split quadrivalent PF, morphine injection, nicotine, ondansetron (ZOFRAN) IV, oxyCODONE, traMADol    Patient Profile   [name] is a 57 y.o. 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  Admitted with worsening SOB and orthopnea in setting of severe biventricular CHF.   Assessment/Plan   1. Acute on chronic systolic CHF:  Nonischemic cardiomyopathy based on cath 7/19 in [place]. Remote heavy ETOH, says he has quit drinking completely for about 2 months now. HIV negative.  No cocaine/amphetamines.  Echo was done this admission with biventricular failure, LV EF 10-20% with dilated RV. There is moderate to severe MR and severe TR with poor coaptation of TV leaflets. On exam, he was markedly volume overloaded initially.  Low output HF indicated by low Co-ox of 43% initially.  cMRI 07/23/18 LVEF EF 14%, Severe RF dysfunction (EF 25%) with nonspecific RV insertion site LGE pattern likely from pressure/volume overload. RHC showed good cardiac output on milrinone with filling pressures nearing normal.  PAPi 2.65 suggested that RV function is better than expected from imaging.  Echo on 9/10 after diuresis showed EF 20-25%, RV looked better, mild to moderate MR, moderate TR. 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 CO-OX 61% today, CVP 13.  MAP 70s-80s.  Milrinone off.  Some volume overload still on exam.  - Will give Lasix 40 mg IV bid today.      - 325 mg ASA until INR 2.  - Coumadin, no heparin for now. INR 1.6.  - Ramp echo Monday.   - He remains on digoxin, check level in am. Will likely stop at followup.  2. OSA: In the community on CPAP.    3. COPD:  He had mostly quit smoking before admission but still smoked a few cigarettes/week. PFTs 07/24/18 FVC 3.19 (79%), FEV1 2.47 (78%), DLCO 53%, TLC 82%.  - No change to  current plan.   4. 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 ?component of ETOH cirrhosis given prior heavy ETOH (has now quit). RUQ US showed ascites with evidence for cirrhosis. NH3 with initial elevation at 64 on 9/3. CT abdomen showed resolution of ascites but there was evidence for cirrhosis. Bilirubin trended down after surgery.   5. RLE DVT:  He has a right peroneal vein (below knee) DVT.  Even though below knee, think he is a relatively high risk  patient with severe cardiomyopathy.  He is on anticoagulation for LVAD.   6. Valvular heart disease:  Moderate to severe MR and severe TR on initial echo.  Suspect this was functional due to annular dilation.  Repeat echo after diuresis on 9/10 showed mild-moderate MR, moderate TR.    7. VT: Run overnight 9/17 in setting of hypomagnesemia and aggressive diuresis.  No further VT.  Keep K> 4.0 MG > 2.0  8. Post-op anemia: Got 1 unit PRBCs on 9/21 with appropriate rise in hgb.   Continue to mobilize and educate.  Maybe home around Tuesday.   Marca Ancona, MD  08/09/2018 8:54 AM

## 2018-08-09 NOTE — Progress Notes (Signed)
ANTICOAGULATION CONSULT NOTE   Pharmacy Consult for Warfarin Indication: LVAD  No Known Allergies  Patient Measurements: Height: 5\' 10"  (177.8 cm) Weight: 146 lb 13.2 oz (66.6 kg) IBW/kg (Calculated) : 73 Heparin Dosing Weight: 67.7 kg  Vital Signs: Temp: 98.1 F (36.7 C) (09/22 1055) Temp Source: Oral (09/22 1055) BP: 98/77 (09/22 1055) Pulse Rate: 107 (09/22 1055)  Labs: Recent Labs    08/07/18 0536 08/08/18 0500 08/09/18 0537  HGB 7.8* 7.2* 8.7*  HCT 24.3* 22.6* 26.5*  PLT 182 232 250  LABPROT 15.8* 17.6* 19.2*  INR 1.28 1.46 1.63  CREATININE 0.54* 0.59* 0.55*    Estimated Creatinine Clearance: 93.7 mL/min (A) (by C-G formula based on SCr of 0.55 mg/dL (L)).   Assessment: 57 y.o. male with RLE DVT no PE on heparin preop.  S/p LVAD 9/16 on warfarin post op.  INR 1.7.  CBC improved post PRBC.    DC planning for this week, eating meals will decrease ensure supplements.    Goal of Therapy:  INR goal 2-2.5 Monitor platelets by anticoagulation protocol: Yes   Plan:  Warfarin 7.5mg  x1 repeat tonight  Daily INR   [name] Pharm.D. CPP, BCPS Clinical Pharmacist [phone] 08/09/2018 11:57 AM

## 2018-08-10 ENCOUNTER — Inpatient Hospital Stay (HOSPITAL_COMMUNITY): Payer: Medicare Other

## 2018-08-10 DIAGNOSIS — I361 Nonrheumatic tricuspid (valve) insufficiency: Secondary | ICD-10-CM

## 2018-08-10 DIAGNOSIS — Z95811 Presence of heart assist device: Secondary | ICD-10-CM

## 2018-08-10 DIAGNOSIS — I5022 Chronic systolic (congestive) heart failure: Secondary | ICD-10-CM

## 2018-08-10 LAB — GLUCOSE, CAPILLARY
GLUCOSE-CAPILLARY: 75 mg/dL (ref 70–99)
Glucose-Capillary: 92 mg/dL (ref 70–99)
Glucose-Capillary: 100 mg/dL — ABNORMAL HIGH (ref 70–99)
Glucose-Capillary: 150 mg/dL — ABNORMAL HIGH (ref 70–99)

## 2018-08-10 LAB — CBC
HEMATOCRIT: 29 % — AB (ref 39.0–52.0)
Hemoglobin: 9.2 g/dL — ABNORMAL LOW (ref 13.0–17.0)
MCH: 28.1 pg (ref 26.0–34.0)
MCHC: 31.7 g/dL (ref 30.0–36.0)
MCV: 88.7 fL (ref 78.0–100.0)
PLATELETS: 355 10*3/uL (ref 150–400)
RBC: 3.27 MIL/uL — AB (ref 4.22–5.81)
RDW: 15.1 % (ref 11.5–15.5)
WBC: 12.7 10*3/uL — ABNORMAL HIGH (ref 4.0–10.5)

## 2018-08-10 LAB — CBC WITH DIFFERENTIAL/PLATELET
Abs Immature Granulocytes: 0.4 10*3/uL — ABNORMAL HIGH (ref 0.0–0.1)
BASOS ABS: 0.1 10*3/uL (ref 0.0–0.1)
Basophils Relative: 1 %
EOS PCT: 4 %
Eosinophils Absolute: 0.4 10*3/uL (ref 0.0–0.7)
HEMATOCRIT: 27.7 % — AB (ref 39.0–52.0)
HEMOGLOBIN: 8.9 g/dL — AB (ref 13.0–17.0)
Immature Granulocytes: 4 %
LYMPHS ABS: 1.7 10*3/uL (ref 0.7–4.0)
LYMPHS PCT: 17 %
MCH: 28 pg (ref 26.0–34.0)
MCHC: 32.1 g/dL (ref 30.0–36.0)
MCV: 87.1 fL (ref 78.0–100.0)
MONO ABS: 2.4 10*3/uL — AB (ref 0.1–1.0)
Monocytes Relative: 24 %
Neutro Abs: 4.9 10*3/uL (ref 1.7–7.7)
Neutrophils Relative %: 50 %
Platelets: 328 10*3/uL (ref 150–400)
RBC: 3.18 MIL/uL — ABNORMAL LOW (ref 4.22–5.81)
RDW: 15.3 % (ref 11.5–15.5)
WBC: 9.9 10*3/uL (ref 4.0–10.5)

## 2018-08-10 LAB — DIGOXIN LEVEL: Digoxin Level: 0.3 ng/mL — ABNORMAL LOW (ref 0.8–2.0)

## 2018-08-10 LAB — COOXEMETRY PANEL
CARBOXYHEMOGLOBIN: 1.9 % — AB (ref 0.5–1.5)
METHEMOGLOBIN: 0.9 % (ref 0.0–1.5)
O2 Saturation: 61 %
TOTAL HEMOGLOBIN: 10 g/dL — AB (ref 12.0–16.0)

## 2018-08-10 LAB — BRAIN NATRIURETIC PEPTIDE: B NATRIURETIC PEPTIDE 5: 537 pg/mL — AB (ref 0.0–100.0)

## 2018-08-10 LAB — BASIC METABOLIC PANEL
Anion gap: 10 (ref 5–15)
BUN: 10 mg/dL (ref 6–20)
CALCIUM: 9.5 mg/dL (ref 8.9–10.3)
CO2: 27 mmol/L (ref 22–32)
Chloride: 101 mmol/L (ref 98–111)
Creatinine, Ser: 0.59 mg/dL — ABNORMAL LOW (ref 0.61–1.24)
GFR calc Af Amer: >60 mL/min (ref >60)
GLUCOSE: 87 mg/dL (ref 70–99)
Potassium: 4 mmol/L (ref 3.5–5.1)
Sodium: 138 mmol/L (ref 135–145)

## 2018-08-10 LAB — MAGNESIUM: Magnesium: 1.9 mg/dL (ref 1.7–2.4)

## 2018-08-10 LAB — ECHOCARDIOGRAM LIMITED
Height: 70 in
Weight: 2286.4 oz

## 2018-08-10 LAB — PROTIME-INR
INR: 1.79
Prothrombin Time: 20.7 seconds — ABNORMAL HIGH (ref 11.4–15.2)

## 2018-08-10 LAB — LACTATE DEHYDROGENASE: LDH: 217 U/L — ABNORMAL HIGH (ref 98–192)

## 2018-08-10 LAB — PHOSPHORUS: Phosphorus: 4.7 mg/dL — ABNORMAL HIGH (ref 2.5–4.6)

## 2018-08-10 MED ORDER — SPIRONOLACTONE 12.5 MG HALF TABLET
12.5000 mg | ORAL_TABLET | Freq: Every day | ORAL | Status: DC
  Administered 2018-08-10: 12.5 mg via ORAL
  Filled 2018-08-10: qty 1

## 2018-08-10 MED ORDER — WARFARIN SODIUM 7.5 MG PO TABS
7.5000 mg | ORAL_TABLET | Freq: Once | ORAL | Status: AC
  Administered 2018-08-10: 7.5 mg via ORAL
  Filled 2018-08-10: qty 1

## 2018-08-10 MED ORDER — MAGNESIUM SULFATE 2 GM/50ML IV SOLN
2.0000 g | Freq: Once | INTRAVENOUS | Status: AC
  Administered 2018-08-10: 2 g via INTRAVENOUS
  Filled 2018-08-10: qty 50

## 2018-08-10 MED ORDER — FUROSEMIDE 40 MG PO TABS
40.0000 mg | ORAL_TABLET | Freq: Two times a day (BID) | ORAL | Status: DC
  Administered 2018-08-10 – 2018-08-12 (×5): 40 mg via ORAL
  Filled 2018-08-10 (×5): qty 1

## 2018-08-10 NOTE — Care Management Important Message (Signed)
Important Message  Patient Details  Name: Theodore Welch MRN: [id number] Date of Birth: [birth year]   Medicare Important Message Given:  Yes    [name] [name] 08/10/2018, 11:45 AM

## 2018-08-10 NOTE — Progress Notes (Signed)
  Speed  Flow  PI  Power  LVIDD  AI  Aortic openings  MR  TR  Septum  RV   5400 4.6 3.3 3.9 4.8 none 5/5 Trace Trace Bow to right Mod HK   5500  4.7 3.5 4.0 4.9 none 5/5 Trace Trace Midline Mod HK  5600  4.8 3.5 4.2 4.9 none 5/5 trace mild Midline Mod HK  5700  4.9 3.6 4.4 4.8 none 5/5 minimal mild midline Mod HK                             Doppler MAP: 80 Auto cuff: 91   Ramp ECHO performed at bedside.  At completion of ramp study, patients primary and back up controller programmed:  Fixed speed: 5700 Low speed limit: 5400  Cherlyn Roberts RN, VAD Coordinator 24/7 pager [phone]

## 2018-08-10 NOTE — Progress Notes (Signed)
Physical Therapy Treatment Patient Details Name: Theodore Welch MRN: [id number] DOB: [birth year] Today's Date: 08/10/2018    History of Present Illness Pt is a 57 y.o. male admitted 07/21/18 with worsening SOB and orthopnea in setting of severe biventricular CHF. Now s/p LVAD on 9/16. PMH includes CHF, DDD, chronic back pain.    PT Comments    Pt moving well but remains unable to maintain elbows in with activity, needs cues to recall all precautions. Pt able to transition from main power to battery without physical assist however cues given to don vest in sitting and then place batteries and controller in. Pt initially standing with equipment hanging off his body onto chair causing tension on drive line to don vest and educated for importance of changing this technique. Pt impulsive with activity and resistant to cues to keep elbows in. Pt overall mobilizing well with no need for assist with transfers, gait or balance. Reinforced precautions and safety with lines but all further therapy needs have been met. Will sign off with pt aware and agreeable.  HR 113 Flow 4.1-4.4, speed 5400, PI 3.9-4.7, power 3.9   Follow Up Recommendations  Supervision for mobility/OOB;No PT follow up     Equipment Recommendations  None recommended by PT    Recommendations for Other Services       Precautions / Restrictions Precautions Precautions: Sternal;Fall Precaution Comments: LVAD. Verbally reviewed sternal precautions    Mobility  Bed Mobility Overal bed mobility: Independent                Transfers Overall transfer level: Independent                  Ambulation/Gait Ambulation/Gait assistance: Modified independent (Device/Increase time) Gait Distance (Feet): 800 Feet Assistive device: None Gait Pattern/deviations: WFL(Within Functional Limits)   Gait velocity interpretation: >4.37 ft/sec, indicative of normal walking speed General Gait Details: Pt able to walk with upright  posture without use of AD with stable gait and quick turns. Pt reports no SOB, dizziness or impaired function   Stairs             Wheelchair Mobility    Modified Rankin (Stroke Patients Only)       Balance     Sitting balance-Leahy Scale: Normal       Standing balance-Leahy Scale: Good                              Cognition Arousal/Alertness: Awake/alert Behavior During Therapy: Impulsive Overall Cognitive Status: Within Functional Limits for tasks assessed                                 General Comments: pt able to recall no pushing and lifting, states to keep elbows in but does not follow with activity      Exercises      General Comments        Pertinent Vitals/Pain Pain Assessment: No/denies pain    Home Living                      Prior Function            PT Goals (current goals can now be found in the care plan section) Progress towards PT goals: Goals met/education completed, patient discharged from PT    Frequency  PT Plan Current plan remains appropriate    Co-evaluation              AM-PAC PT "6 Clicks" Daily Activity  Outcome Measure  Difficulty turning over in bed (including adjusting bedclothes, sheets and blankets)?: None Difficulty moving from lying on back to sitting on the side of the bed? : None Difficulty sitting down on and standing up from a chair with arms (e.g., wheelchair, bedside commode, etc,.)?: None Help needed moving to and from a bed to chair (including a wheelchair)?: None Help needed walking in hospital room?: None Help needed climbing 3-5 steps with a railing? : A Little 6 Click Score: 23    End of Session   Activity Tolerance: Patient tolerated treatment well Patient left: in chair;with call bell/phone within reach Nurse Communication: Mobility status;Precautions PT Visit Diagnosis: Other abnormalities of gait and mobility (R26.89)     Time:  3462-1947 PT Time Calculation (min) (ACUTE ONLY): 25 min  Charges:  $Gait Training: 8-22 mins $Therapeutic Activity: 8-22 mins                     Martha Pager: [phone] Office: [place] 08/10/2018, 11:55 AM

## 2018-08-10 NOTE — Progress Notes (Signed)
Patient ID: Theodore Welch, male   DOB: October 13, 1961, 57 y.o.   MRN: [id number] HeartMate 3 Rounding Note  Subjective:    Feels well. Ambulating well, pain controlled. Bowels moving Diuresed well yesterday -4L  MAP 80's CVP 8   LVAD INTERROGATION:  HeartMate IIl LVAD:  Flow 5 liters/min, speed 5700, power 4, PI 3.4    Objective:    Vital Signs:   Temp:  [97.8 F (36.6 C)-99.5 F (37.5 C)] 99.5 F (37.5 C) (09/23 2000) Pulse Rate:  [97-113] 109 (09/23 1229) Resp:  [20-30] 25 (09/23 1526) BP: (90-110)/(62-93) 90/74 (09/23 2000) SpO2:  [100 %] 100 % (09/23 1526) Weight:  [64.8 kg] 64.8 kg (09/23 0600) Last BM Date: 08/10/18 Mean arterial Pressure 80's  Intake/Output:   Intake/Output Summary (Last 24 hours) at 08/10/2018 2210 Last data filed at 08/10/2018 2000 Gross per 24 hour  Intake 2060 ml  Output 3500 ml  Net -1440 ml     Physical Exam: General:  Well appearing. No resp difficulty HEENT: normal Cor: occasional heart sounds with LVAD hum present. Lungs: decreased left base Chest dressing dry Abdomen: soft, nontender, nondistended. No hepatosplenomegaly. No bruits or masses. Good bowel sounds. No drainage on exit site dressing. Extremities: no cyanosis, clubbing, rash, edema Neuro: alert & orientedx3, cranial nerves grossly intact. moves all 4 extremities w/o difficulty. Affect pleasant  Telemetry: sinus 90-100's  Labs: Basic Metabolic Panel: Recent Labs  Lab 08/06/18 0500 08/07/18 0536 08/08/18 0500 08/09/18 0537 08/10/18 0500  NA 136 139 140 140 138  K 4.2 4.1 4.1 4.2 4.0  CL 101 103 104 104 101  CO2 24 25 27 25 27   GLUCOSE 145* 103* 95 90 87  BUN 10 11 12 13 10   CREATININE 0.55* 0.54* 0.59* 0.55* 0.59*  CALCIUM 9.8 9.8 9.6 9.3 9.5  MG 1.9 1.8 1.8 2.0 1.9  PHOS 2.9 3.8 4.4 4.1 4.7*    Liver Function Tests: Recent Labs  Lab 08/04/18 0311 08/05/18 0252 08/06/18 0500  AST 64* 55* 30  ALT 18 20 17   ALKPHOS 66 67 70  BILITOT 2.5* 2.3* 1.9*   PROT 6.1* 5.9* 6.1*  ALBUMIN 3.6 3.7 3.3*   No results for input(s): LIPASE, AMYLASE in the last 168 hours. No results for input(s): AMMONIA in the last 168 hours.  CBC: Recent Labs  Lab 08/06/18 0500 08/07/18 0536 08/08/18 0500 08/09/18 0537 08/10/18 0500 08/10/18 1327  WBC 15.7* 12.4* 10.7* 10.2 9.9 12.7*  NEUTROABS 11.3* 8.7* 6.7 5.5 4.9  --   HGB 8.2* 7.8* 7.2* 8.7* 8.9* 9.2*  HCT 25.3* 24.3* 22.6* 26.5* 27.7* 29.0*  MCV 88.2 88.7 88.3 88.0 87.1 88.7  PLT 140* 182 232 250 328 355    INR: Recent Labs  Lab 08/06/18 0500 08/07/18 0536 08/08/18 0500 08/09/18 0537 08/10/18 0500  INR 1.29 1.28 1.46 1.63 1.79    Other results:  EKG:   Imaging: No results found.   Medications:     Scheduled Medications: . aspirin EC  81 mg Oral Daily  . bisacodyl  10 mg Oral Daily   Or  . bisacodyl  10 mg Rectal Daily  . digoxin  0.125 mg Oral Daily  . docusate sodium  200 mg Oral Daily  . DULoxetine  60 mg Oral Daily  . ferrous fumarate-b12-vitamic C-folic acid  1 capsule Oral TID PC  . furosemide  40 mg Oral BID  . gabapentin  600 mg Oral TID  . insulin aspart  0-24 Units Subcutaneous TID  WC  . losartan  25 mg Oral Daily  . pantoprazole  40 mg Oral Daily  . spironolactone  12.5 mg Oral Daily  . thiamine  100 mg Oral Daily  . Warfarin - Pharmacist Dosing Inpatient   Does not apply q1800    Infusions: . lactated ringers Stopped (08/04/18 0715)  . lactated ringers 10 mL/hr at 08/07/18 1200    PRN Medications: feeding supplement (ENSURE ENLIVE), hydrALAZINE, Influenza vac split quadrivalent PF, morphine injection, nicotine, ondansetron (ZOFRAN) IV, oxyCODONE, traMADol   Assessment/Plan/Discussion:     POD 7 s/p Heartmate III for nonischemic cardiomyopathy with EF 10-20% on admission with dilated and hypokinetic RV, moderate to severe MR and severe TR. He tuned up nicely preop with milrinone and diuresis with reduction of CVP to normal, moderate MR and mild to  moderate TR.  Hemodynamics look good.  Co-ox 67 this am. Ramp echo done today and speed increased to 5700.   INR has bumped to 1.79. Coumadin per pharmacy. Continue ASA.   Wt is almost down to preop.  DM: glucose under good control.   Expected acute postop blood loss anemia: Hgb improved after transfusion.   IS, OOB, ambulate.  Teaching is going well.  He is about ready to go home.    I reviewed the LVAD parameters from today, and compared the results to the patient's prior recorded data.  No programming changes were made.  The LVAD is functioning within specified parameters.  The patient performs LVAD self-test daily.  LVAD interrogation was negative for any significant power changes, alarms or PI events/speed drops.  LVAD equipment check completed and is in good working order.  Back-up equipment present.   LVAD education done on emergency procedures and precautions and reviewed exit site care.  Length of Stay: [address]  Alleen Borne 08/10/2018, 10:10 PM

## 2018-08-10 NOTE — Progress Notes (Signed)
ANTICOAGULATION CONSULT NOTE   Pharmacy Consult for Warfarin Indication: LVAD  No Known Allergies  Patient Measurements: Height: 5\' 10"  (177.8 cm) Weight: 142 lb 14.4 oz (64.8 kg) IBW/kg (Calculated) : 73 Heparin Dosing Weight: 67.7 kg  Vital Signs: Temp: 98.1 F (36.7 C) (09/23 0720) Temp Source: Oral (09/23 0720) BP: 110/81 (09/23 0600) Pulse Rate: 113 (09/23 1151)  Labs: Recent Labs    08/08/18 0500 08/09/18 0537 08/10/18 0500  HGB 7.2* 8.7* 8.9*  HCT 22.6* 26.5* 27.7*  PLT 232 250 328  LABPROT 17.6* 19.2* 20.7*  INR 1.46 1.63 1.79  CREATININE 0.59* 0.55* 0.59*    Estimated Creatinine Clearance: 91.1 mL/min (A) (by C-G formula based on SCr of 0.59 mg/dL (L)).   Assessment: 57 y.o. male with RLE DVT on heparin preop now s/p LVAD 9/16. Pt is currently on warfarin with INR trending up appropriately. CBC and LDH stable today.  Goal of Therapy:  INR goal 2-2.5 Monitor platelets by anticoagulation protocol: Yes   Plan:  -Warfarin 7.5mg  x1 repeat tonight  -Daily INR, LDH, CBC  Fredonia Highland, PharmD, BCPS Clinical Pharmacist [phone] Please check AMION for all [facility] numbers 08/10/2018

## 2018-08-10 NOTE — Progress Notes (Addendum)
Patient ID: Theodore Welch, male   DOB: 1961-06-29, 57 y.o.   MRN: [id number]     Advanced Heart Failure Rounding Note  PCP-Cardiologist: No primary care provider on file.   Subjective:    Denies SOB. Feels fine.Had BM today. Appetite improving.   HMIII Speed 5400 Flow 4.3  Power 3.8 PI 3.8      Objective:    Weight Range: 64.8 kg Body mass index is 20.5 kg/m.   Vital Signs:   Temp:  [97.8 F (36.6 C)-98.5 F (36.9 C)] 98.1 F (36.7 C) (09/23 0720) Pulse Rate:  [97-111] 97 (09/23 0720) Resp:  [20-30] 29 (09/23 0720) BP: (71-110)/(56-81) 110/81 (09/23 0600) SpO2:  [100 %] 100 % (09/22 2339) Weight:  [64.8 kg] 64.8 kg (09/23 0600) Last BM Date: 08/08/18  Weight change: Filed Weights   08/08/18 0659 08/09/18 0412 08/10/18 0600  Weight: 64.7 kg 66.6 kg 64.8 kg   Intake/Output:   Intake/Output Summary (Last 24 hours) at 08/10/2018 0910 Last data filed at 08/10/2018 0600 Gross per 24 hour  Intake 960 ml  Output 4300 ml  Net -3340 ml    Physical Exam  CVP 8  General:   No resp difficulty. Sitting on the side of the bed.  HEENT: normal Neck: supple. JVP 6-7 . Carotids 2+ bilat; no bruits. No lymphadenopathy or thryomegaly appreciated. Cor: PMI nondisplaced. Regular rate & rhythm. No rubs, gallops or murmurs.Sternal incision approximated.  Lungs: clear Abdomen: soft, nontender, nondistended. No hepatosplenomegaly. No bruits or masses. Good bowel sounds. Extremities: no cyanosis, clubbing, rash, edema Neuro: alert & orientedx3, cranial nerves grossly intact. moves all 4 extremities w/o difficulty. Affect pleasant   Telemetry   Sinus Tach 100s   Labs    CBC Recent Labs    08/09/18 0537 08/10/18 0500  WBC 10.2 9.9  NEUTROABS 5.5 4.9  HGB 8.7* 8.9*  HCT 26.5* 27.7*  MCV 88.0 87.1  PLT 250 328   Basic Metabolic Panel Recent Labs    25/95/63 0537 08/10/18 0500  NA 140 138  K 4.2 4.0  CL 104 101  CO2 25 27  GLUCOSE 90 87  BUN 13 10  CREATININE 0.55*  0.59*  CALCIUM 9.3 9.5  MG 2.0 1.9  PHOS 4.1 4.7*   Liver Function Tests No results for input(s): AST, ALT, ALKPHOS, BILITOT, PROT, ALBUMIN in the last 72 hours. No results for input(s): LIPASE, AMYLASE in the last 72 hours. Cardiac Enzymes No results for input(s): CKTOTAL, CKMB, CKMBINDEX, TROPONINI in the last 72 hours.  BNP: BNP (last 3 results) Recent Labs    07/21/18 0540 08/04/18 0902 08/10/18 0026  BNP 1,343.9* 658.6* 537.0*    ProBNP (last 3 results) No results for input(s): PROBNP in the last 8760 hours.   D-Dimer No results for input(s): DDIMER in the last 72 hours. Hemoglobin A1C No results for input(s): HGBA1C in the last 72 hours. Fasting Lipid Panel No results for input(s): CHOL, HDL, LDLCALC, TRIG, CHOLHDL, LDLDIRECT in the last 72 hours. Thyroid Function Tests No results for input(s): TSH, T4TOTAL, T3FREE, THYROIDAB in the last 72 hours.  Invalid input(s): FREET3  Other results:   Imaging    No results found.   Medications:     Scheduled Medications: . aspirin EC  81 mg Oral Daily  . bisacodyl  10 mg Oral Daily   Or  . bisacodyl  10 mg Rectal Daily  . digoxin  0.125 mg Oral Daily  . docusate sodium  200 mg Oral  Daily  . DULoxetine  60 mg Oral Daily  . ferrous fumarate-b12-vitamic C-folic acid  1 capsule Oral TID PC  . gabapentin  600 mg Oral TID  . insulin aspart  0-24 Units Subcutaneous TID WC  . losartan  25 mg Oral Daily  . pantoprazole  40 mg Oral Daily  . thiamine  100 mg Oral Daily  . Warfarin - Pharmacist Dosing Inpatient   Does not apply q1800    Infusions: . lactated ringers Stopped (08/04/18 0715)  . lactated ringers 10 mL/hr at 08/07/18 1200    PRN Medications: feeding supplement (ENSURE ENLIVE), hydrALAZINE, Influenza vac split quadrivalent PF, morphine injection, nicotine, ondansetron (ZOFRAN) IV, oxyCODONE, traMADol    Patient Profile   Theodore Welch is a 57 y.o. male with DM2, HLD, OSA, CPAP, tobacco use,  Chronic biventricular CHF (EF 20-25%) followed by [employer].  Admitted with worsening SOB and orthopnea in setting of severe biventricular CHF.   Assessment/Plan   1. Acute on chronic systolic CHF:  Nonischemic cardiomyopathy based on cath 7/19 in [place]. Remote heavy ETOH, says he has quit drinking completely for about 2 months now. HIV negative.  No cocaine/amphetamines.  Echo was done this admission with biventricular failure, LV EF 10-20% with dilated RV. There is moderate to severe MR and severe TR with poor coaptation of TV leaflets. On exam, he was markedly volume overloaded initially.  Low output HF indicated by low Co-ox of 43% initially.  cMRI 07/23/18 LVEF EF 14%, Severe RF dysfunction (EF 25%) with nonspecific RV insertion site LGE pattern likely from pressure/volume overload. RHC showed good cardiac output on milrinone with filling pressures nearing normal.  PAPi 2.65 suggested that RV function is better than expected from imaging.  Echo on 9/10 after diuresis showed EF 20-25%, RV looked better, mild to moderate MR, moderate TR. 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 CO-OX 61%. CVP 8. Volume status stable.  Maps 80-90s.  - Start 12.5 mg spiro daily.  - Stop IV lasix. Start lasix 40 mg twice a day po.      - 325 mg ASA until INR 2.  - INR 1.8.  - Ramp echo today.  - He remains on digoxin, dig level 0.3   2. OSA: In the community on CPAP.    3. COPD:  He had mostly quit smoking before admission but still smoked a few cigarettes/week. PFTs 07/24/18 FVC 3.19 (79%), FEV1 2.47 (78%), DLCO 53%, TLC 82%.  - No change to current plan.   4. 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 ?component of ETOH cirrhosis given prior heavy ETOH (has now quit). RUQ US showed ascites with evidence for cirrhosis. NH3 with initial elevation at 64 on 9/3. CT abdomen showed resolution of ascites but there was evidence for cirrhosis. Bilirubin trended down after surgery.   5. RLE DVT:  He has a right  peroneal vein (below knee) DVT.  Even though below knee, think he is a relatively high risk patient with severe cardiomyopathy.  He is on anticoagulation for LVAD.   - On coumadin.  6. Valvular heart disease:  Moderate to severe MR and severe TR on initial echo.  Suspect this was functional due to annular dilation.  Repeat echo after diuresis on 9/10 showed mild-moderate MR, moderate TR.    7. VT: Run overnight 9/17 in setting of hypomagnesemia and aggressive diuresis.  No further VT.  Keep K> 4.0 MG > 2.0  Mag 1.9 . Give 2 grams Mag.  8. Post-op anemia: Got 1 unit PRBCs on 9/21 with appropriate rise in hgb. Hgb 9.9.   Anticipate d/c tomorrow.   Tonye Becket, NP  08/10/2018 9:10 AM   Patient seen with NP, agree with the above note.  He is doing well today.  INR up to 1.8.  Volume status looks ok, will transition to po Lasix.   Ramp echo done today, speed increased to 5700 rpm.  Will watch overnight on higher speed.    If he is stable in the morning with therapeutic INR, I think that he can go home.    Marca Ancona 08/10/2018 2:21 PM

## 2018-08-10 NOTE — Progress Notes (Signed)
CSW met with pt and pt fiance, Theodore Welch, at bedside to check in.  Patient reports feeling good- reports better breathing following surgery and is remaining in good spirits regarding his recovery and current condition.  Pt caregiver, Cinda Quest, also reports feeling positive but having some concerns about dressing changes- is having problems with putting on the sterile gloves.  Per patient, concerns have already been reported to Heart Failure NP and they are planning on doing further teaching with patient and caregiver today.  Patient also reports that their neighbor is a retired Marine scientist and she can provide help and supervision for caregiver until she is comfortable.  Patient is feeling good about going home and is hopeful for discharge tomorrow.   CSW will continue to follow and assist as needed.  Theodore Ny, LCSW Clinical Social Worker

## 2018-08-10 NOTE — Progress Notes (Signed)
Occupational Therapy Treatment Patient Details Name: Theodore Welch MRN: [id number] DOB: 10-06-1961 Today's Date: 08/10/2018    History of present illness Pt is a 58 y.o. male admitted 07/21/18 with worsening SOB and orthopnea in setting of severe biventricular CHF. Now s/p LVAD on 9/16. PMH includes CHF, DDD, chronic back pain.   OT comments  Pt progressing towards established OT goals. Providing education on UB/LB dressing and compensatory techniques for adherence to sternal precautions. When switching from LVAD batteries, Min VCs for sequencing. However, pt requiring Max cues to adhere to sternal precautions and to not stand without control panel in his hand. Pt presenting with impulsivity and poor awareness. Continue to recommend dc home and will continue to follow acutely as admitted.    Follow Up Recommendations  No OT follow up    Equipment Recommendations  3 in 1 bedside commode    Recommendations for Other Services      Precautions / Restrictions Precautions Precautions: Sternal;Fall Precaution Comments: LVAD. Reviewed all sternal precautions. Pt able to verbalize understanding. However, demonstrated poor understanding.  Restrictions Weight Bearing Restrictions: No       Mobility Bed Mobility Overal bed mobility: Independent Bed Mobility: Sit to Supine       Sit to supine: Min guard   General bed mobility comments: Min Guard A for safety. Pt declining log roll technique  Transfers Overall transfer level: Independent Equipment used: 4-wheeled walker Transfers: Sit to/from Stand Sit to Stand: Supervision         General transfer comment: cues for hands on knees.     Balance Overall balance assessment: Modified Independent Sitting-balance support: No upper extremity supported;Feet supported Sitting balance-Leahy Scale: Normal       Standing balance-Leahy Scale: Good                             ADL either performed or assessed with clinical  judgement   ADL Overall ADL's : Needs assistance/impaired                 Upper Body Dressing : Sitting;Min guard;Cueing for sequencing;Cueing for safety;Cueing for compensatory techniques;Cueing for UE precautions Upper Body Dressing Details (indicate cue type and reason): Educating pt on UB dressing. When donning vest for LVAD controller and batteries, pt requiring Max cues for safety and to adheir to sternal precautions. Pt attempting to reach behind himself multiple times.    Lower Body Dressing Details (indicate cue type and reason): Educating pt on donning LB clothing and pt bringing his ankles to his knees while supine in bed. Pt also reporting he has and uses AE for LB ADLs.        Toileting - Clothing Manipulation Details (indicate cue type and reason): Educating pt on compensatory techniques for toilet hygiene after BM. Pt verbalized understanding     Functional mobility during ADLs: Min guard;Rolling walker General ADL Comments: Pt requiring Min cues when managing LVAD controls and batteries. Pt with decreased awareness of sternal precautions and required cues throughout.     Vision       Perception     Praxis      Cognition Arousal/Alertness: Awake/alert Behavior During Therapy: Impulsive Overall Cognitive Status: Within Functional Limits for tasks assessed  Exercises     Shoulder Instructions       General Comments fiance present throughout session. Soft BP    Pertinent Vitals/ Pain       Pain Assessment: Faces Faces Pain Scale: Hurts a little bit Pain Location: Incision with coughing Pain Descriptors / Indicators: Grimacing;Sore Pain Intervention(s): Monitored during session  Home Living                                          Prior Functioning/Environment              Frequency  Min 2X/week        Progress Toward Goals  OT Goals(current goals can now be  found in the care plan section)  Progress towards OT goals: Progressing toward goals  Acute Rehab OT Goals Patient Stated Goal: to live for his grandchildren OT Goal Formulation: With patient Time For Goal Achievement: 08/20/18 Potential to Achieve Goals: Good ADL Goals Pt Will Perform Grooming: with supervision;standing Pt Will Perform Lower Body Bathing: with supervision;sit to/from stand Pt Will Perform Lower Body Dressing: with supervision;sit to/from stand Pt Will Transfer to Toilet: with supervision;ambulating Pt Will Perform Toileting - Clothing Manipulation and hygiene: with supervision;sit to/from stand Additional ADL Goal #1: Pt will perform bed mobility modified independently. Additional ADL Goal #2: Pt will manage LVAD equipment independently  Plan Discharge plan remains appropriate    Co-evaluation                 AM-PAC PT "6 Clicks" Daily Activity     Outcome Measure   Help from another person eating meals?: None Help from another person taking care of personal grooming?: A Little Help from another person toileting, which includes using toliet, bedpan, or urinal?: A Little Help from another person bathing (including washing, rinsing, drying)?: A Little Help from another person to put on and taking off regular upper body clothing?: A Little Help from another person to put on and taking off regular lower body clothing?: A Little 6 Click Score: 19    End of Session Equipment Utilized During Treatment: Rolling walker  OT Visit Diagnosis: Unsteadiness on feet (R26.81);Other abnormalities of gait and mobility (R26.89);Pain;Muscle weakness (generalized) (M62.81)   Activity Tolerance Patient tolerated treatment well   Patient Left with call bell/phone within reach;with family/visitor present;in chair(EOB)   Nurse Communication Mobility status;Other (comment)(change dressing)        Time: 8099-8338 OT Time Calculation (min): 43 min  Charges: OT General  Charges $OT Visit: 1 Visit OT Treatments $Self Care/Home Management : 38-52 mins  Eutha Cude MSOT, OTR/L Acute Rehab Pager: [phone] Office: [phone]   Theodoro Grist Anatalia Kronk 08/10/2018, 5:34 PM

## 2018-08-11 LAB — CBC
HCT: 30.1 % — ABNORMAL LOW (ref 39.0–52.0)
Hemoglobin: 9.6 g/dL — ABNORMAL LOW (ref 13.0–17.0)
MCH: 28.2 pg (ref 26.0–34.0)
MCHC: 31.9 g/dL (ref 30.0–36.0)
MCV: 88.5 fL (ref 78.0–100.0)
PLATELETS: 368 10*3/uL (ref 150–400)
RBC: 3.4 MIL/uL — ABNORMAL LOW (ref 4.22–5.81)
RDW: 15 % (ref 11.5–15.5)
WBC: 12.8 10*3/uL — ABNORMAL HIGH (ref 4.0–10.5)

## 2018-08-11 LAB — BASIC METABOLIC PANEL
Anion gap: 11 (ref 5–15)
BUN: 11 mg/dL (ref 6–20)
CALCIUM: 9.8 mg/dL (ref 8.9–10.3)
CO2: 26 mmol/L (ref 22–32)
CREATININE: 0.63 mg/dL (ref 0.61–1.24)
Chloride: 101 mmol/L (ref 98–111)
GFR calc Af Amer: >60 mL/min (ref >60)
Glucose, Bld: 136 mg/dL — ABNORMAL HIGH (ref 70–99)
Potassium: 4.1 mmol/L (ref 3.5–5.1)
SODIUM: 138 mmol/L (ref 135–145)

## 2018-08-11 LAB — PROTIME-INR
INR: 1.68
Prothrombin Time: 19.7 seconds — ABNORMAL HIGH (ref 11.4–15.2)

## 2018-08-11 LAB — COOXEMETRY PANEL
CARBOXYHEMOGLOBIN: 1.8 % — AB (ref 0.5–1.5)
METHEMOGLOBIN: 1.5 % (ref 0.0–1.5)
O2 SAT: 67.5 %
TOTAL HEMOGLOBIN: 9.7 g/dL — AB (ref 12.0–16.0)

## 2018-08-11 LAB — LACTATE DEHYDROGENASE: LDH: 227 U/L — AB (ref 98–192)

## 2018-08-11 LAB — GLUCOSE, CAPILLARY
GLUCOSE-CAPILLARY: 103 mg/dL — AB (ref 70–99)
GLUCOSE-CAPILLARY: 147 mg/dL — AB (ref 70–99)
Glucose-Capillary: 105 mg/dL — ABNORMAL HIGH (ref 70–99)

## 2018-08-11 LAB — MAGNESIUM: MAGNESIUM: 2 mg/dL (ref 1.7–2.4)

## 2018-08-11 MED ORDER — WARFARIN SODIUM 10 MG PO TABS
10.0000 mg | ORAL_TABLET | Freq: Once | ORAL | Status: AC
  Administered 2018-08-11: 10 mg via ORAL
  Filled 2018-08-11: qty 1

## 2018-08-11 MED ORDER — ENSURE ENLIVE PO LIQD
237.0000 mL | Freq: Two times a day (BID) | ORAL | Status: DC
  Administered 2018-08-11 – 2018-08-12 (×2): 237 mL via ORAL

## 2018-08-11 MED ORDER — SPIRONOLACTONE 25 MG PO TABS
25.0000 mg | ORAL_TABLET | Freq: Every day | ORAL | Status: DC
  Administered 2018-08-11 – 2018-08-12 (×2): 25 mg via ORAL
  Filled 2018-08-11 (×2): qty 1

## 2018-08-11 NOTE — Progress Notes (Signed)
Driveline dressing change done by significant other with RN observing. Required some cueing for sterile technique. Existing VAD dressing removed. Site care performed using sterile technique. Drive line exit site cleansed with chlora prep x2 allowed to dry then silver strip and gauze dressing applied.

## 2018-08-11 NOTE — Progress Notes (Signed)
LVAD Coordinator Rounding Note:  Admitted 07/21/18 due to worsening of LE edema, orthopnea and PND over the past 2-3 days. Dr. Shirlee Latch consulted.   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 Laneta Simmers under DT criteria due to current smoker and positive THC.  Pt up in chair with caregiver at his bedside. Discharge teaching was rescheduled to today at 9:00. Pt excited to go home, but admits to being a "little anxious" with VAD, equipment, and lifestyle changes.  Vital signs: Tmax: 98.2 HR: 100 Doppler:  86 Auto BP:  103/82 (90)  O2: 99% on RA Wt: 138.0>157.6>158.5>147.2>149.4>142>146>142>146>142>144 lbs   LVAD interrogation reveals:  Speed: 5700 Flow: 4.6 Power:  4.4w PI: 3.2 Alarms: none Events: none Hematocrit: 27 Fixed speed: 5700 Low speed limit: 5400  Drive Line: Every other day dressing w/silver strip. Next dressing change due today.  Dressing may be changed by VAD Coordinator, Nurse Alla Feeling, or trained caregiver.     Labs:  LDH trend: 240>263>255>242>225>224>217>227  INR trend: 1.14>1.28>1.29>1.28>1.46>1.63>1.79>1.68  Anticoagulation Plan: -INR Goal: 2-2.5 -ASA Dose: 81 mg daily  Blood Products:  -intra op: 08/03/18 3 u FFP  Respiratory: Pt extubated 08/05/18  Gtts: Milrinone 0.125 mcg/kg/min off 08/07/18 Epi 0.5 mcg/min off 08/06/18   Adverse Events on VAD:  VAD Education: 1. Patient VAD discharge education today with patient and [name].   2. Every other day dressing changes per VAD Coordinator, Nurse Alla Feeling, or trained caregiver. [name] will change today under Okey Regal ([place]) supervisions.  3. Delivered VAD home equipment today.   Plan/Recommendations:   1. VAD Coordinator, nurse champion, or trained caregiver to change drive line dressings every other day.  2. Continue to mobilize pt.  3. Call VAD coordinator for any equipment issues or drive line concerns.   Hessie Diener RN, BSN VAD Coordinator 24/7 Pager [phone]

## 2018-08-11 NOTE — Discharge Summary (Signed)
Advanced Heart Failure Team  Discharge Summary   Patient ID: Theodore Welch MRN: [id number], DOB/AGE: [birth year] 57 y.o. Admit date: 07/21/2018 D/C date:     08/12/2018   Primary Discharge Diagnoses:  1. 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 OSA 3. COPD 4. Cirrhosis 5. RLE DVT 6. Valvular Heart Disease 7. VT 8. 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  Admitted with worsening SOB and orthopnea in setting of severe biventricular CHF.ECHO showed biventricular failure with LVEF 10-15%. Diuresed with IV lasix and placed with milrinone. CT surgery consulted for mechanical support. He was evaluated by the VAD Team and deemed appropriate for HMIII under DT.  Once optimizied he underwent HMIII on 08/03/2018 . Post operatively he was extubated on post op day 1 and  drips were gradually weaned off. He was able to tolerate regular diet and pain was controlled with appropriate medications. See below for details.   He and his fiance  received extensive VAD education. He will continue to  be followed closely in the VAD clinic.   1. Acute on chronic systolic CHF:  Nonischemic cardiomyopathy based on cath 7/19 in [place].Remote heavy ETOH, says he has quit drinking completely for about 2 months now. HIV negative.  No cocaine/amphetamines. Echo was done this admission with biventricular failure, LV EF 10-20% with dilated RV. There is moderate to severe MR and severe TR with poor coaptation of TV leaflets. On exam, he was markedly volume overloaded initially. Low output HF indicated by low Co-ox of 43% initially.  cMRI 07/23/18 LVEF EF 14%, Severe RF dysfunction (EF 25%) with nonspecific RV insertion site LGE pattern likely from pressure/volume overload. RHC showed good cardiac output on milrinone with filling pressures nearing normal.   PAPi 2.65 suggested that RV function is better than expected from imaging.  Echo on 9/10 after diuresis showed EF 20-25%, RV looked better, mild to moderate MR, moderate TR.  Once optimized he underwent HMIII LVAD on 9/16. Ramp ECHO completed on 9/23 with speed increased to 5700. CO-OX remained stable off milrinone.   Plan to continue spironolactone, lasix, and losartan.  - Renal function was followed closely and remained stable.  - Continue 81 mg asa.  - INR therapeutic at the time discharge. - He remains on digoxin, dig level 0.3  Should be able to stop as an outpatient.   2. OSA: In the community on CPAP.    3. COPD:  He had mostly quit smoking before admission but still smoked a few cigarettes/week. PFTs 07/24/18 FVC 3.19 (79%), FEV1 2.47 (78%), DLCO 53%, TLC 82%.  - No change to current plan.   4. 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 ?component of ETOH cirrhosis given prior heavy ETOH (has now quit). RUQ US showed ascites with evidence for cirrhosis. NH3 with initial elevation at 64 on 9/3. CT abdomen showed resolution of ascites but there was evidence for cirrhosis. Bilirubin trended down after surgery.   5. RLE DVT:  He has a right peroneal vein (below knee) DVT.  Even though below knee, think he is a relatively high risk patient with severe cardiomyopathy.  He is on anticoagulation for LVAD.   - On coumadin.  6. Valvular heart disease:  Moderate to severe MR and severe TR on initial echo.  Suspect this was functional  due to annular dilation.  Repeat echo after diuresis on 9/10 showed mild-moderate MR, moderate TR.    7. VT: Run overnight 9/17 in setting of hypomagnesemia and aggressive diuresis.  No further VT.  Keep K> 4.0 MG > 2.0  8. Post-op anemia: Got 1 unit PRBCs on 9/21 with appropriate rise in hgb. Plan to to check CBC next week.   LVAD Interrogation HM III:   Speed: 5700    Flow:  5    PI:  3.1     Power:   4         Discharge Weight: 144 pounds.  Discharge  Vitals: Blood pressure 98/87, pulse 99, temperature 98.4 F (36.9 C), temperature source Oral, resp. rate 16, height 5\' 10"  (1.778 m), weight 65.5 kg, SpO2 97 %.  Labs: Lab Results  Component Value Date   WBC 14.2 (H) 08/12/2018   HGB 9.3 (L) 08/12/2018   HCT 29.4 (L) 08/12/2018   MCV 89.1 08/12/2018   PLT 352 08/12/2018    Recent Labs  Lab 08/06/18 0500  08/12/18 0346  NA 136   < > 134*  K 4.2   < > 4.0  CL 101   < > 99  CO2 24   < > 24  BUN 10   < > 13  CREATININE 0.55*   < > 0.72  CALCIUM 9.8   < > 9.6  PROT 6.1*  --   --   BILITOT 1.9*  --   --   ALKPHOS 70  --   --   ALT 17  --   --   AST 30  --   --   GLUCOSE 145*   < > 187*   < > = values in this interval not displayed.   Lab Results  Component Value Date   CHOL 97 07/21/2018   HDL 32 (L) 07/21/2018   LDLCALC 57 07/21/2018   TRIG 39 07/21/2018   BNP (last 3 results) Recent Labs    07/21/18 0540 08/04/18 0902 08/10/18 0026  BNP 1,343.9* 658.6* 537.0*    ProBNP (last 3 results) No results for input(s): PROBNP in the last 8760 hours.   Diagnostic Studies/Procedures   No results found.  Discharge Medications   Allergies as of 08/12/2018   No Known Allergies     Medication List    STOP taking these medications   lidocaine 5 % Commonly known as:  LIDODERM     TAKE these medications   aspirin EC 81 MG tablet Take 81 mg by mouth daily.   atorvastatin 20 MG tablet Commonly known as:  LIPITOR Take 10 mg by mouth daily.   carbamide peroxide 6.5 % OTIC solution Commonly known as:  DEBROX Place 5 drops into both ears 3 (three) times daily. 5-10 drops   clotrimazole 1 % external solution Commonly known as:  LOTRIMIN Apply 1 application topically daily.   digoxin 0.125 MG tablet Commonly known as:  LANOXIN Take 1 tablet (0.125 mg total) by mouth daily. Start taking on:  08/13/2018   docusate sodium 100 MG capsule Commonly known as:  COLACE Take 2 capsules (200 mg total) by mouth daily  as needed for mild constipation.   DULoxetine 60 MG capsule Commonly known as:  CYMBALTA Take 60 mg by mouth daily.   fluticasone 50 MCG/ACT nasal spray Commonly known as:  FLONASE Place 1 spray into both nostrils 2 (two) times daily as needed for allergies.   furosemide 40 MG tablet Commonly  known as:  LASIX Take 1 tablet (40 mg total) by mouth 2 (two) times daily. What changed:    medication strength  how much to take  when to take this   gabapentin 300 MG capsule Commonly known as:  NEURONTIN Take 600 mg by mouth 3 (three) times daily.   hydrOXYzine 25 MG capsule Commonly known as:  VISTARIL Take 25 mg by mouth 3 (three) times daily as needed for anxiety.   Ipratropium-Albuterol 20-100 MCG/ACT Aers respimat Commonly known as:  COMBIVENT Inhale 1 puff into the lungs every 6 (six) hours.   losartan 25 MG tablet Commonly known as:  COZAAR Take 1 tablet (25 mg total) by mouth daily. Start taking on:  08/13/2018   metFORMIN 500 MG 24 hr tablet Commonly known as:  GLUCOPHAGE-XR Take 500 mg by mouth 2 (two) times daily.   methocarbamol 750 MG tablet Commonly known as:  ROBAXIN Take 750 mg by mouth 3 (three) times daily as needed for muscle spasms.   nicotine 7 mg/24hr patch Commonly known as:  NICODERM CQ - dosed in mg/24 hr Place 1 patch (7 mg total) onto the skin daily as needed (tobacco craving).   omeprazole 20 MG capsule Commonly known as:  PRILOSEC Take 20 mg by mouth daily.   oxyCODONE 5 MG immediate release tablet Commonly known as:  Oxy IR/ROXICODONE Take 1 tablet (5 mg total) by mouth every 8 (eight) hours as needed for severe pain.   polyethylene glycol packet Commonly known as:  MIRALAX / GLYCOLAX Take 17 g by mouth daily as needed for mild constipation.   sildenafil 100 MG tablet Commonly known as:  VIAGRA Take 50 mg by mouth daily as needed for erectile dysfunction.   spironolactone 25 MG tablet Commonly known as:  ALDACTONE Take 1 tablet  (25 mg total) by mouth daily. Start taking on:  08/13/2018   thiamine 100 MG tablet Take 100 mg by mouth daily.   traMADol 50 MG tablet Commonly known as:  ULTRAM Take 1 tablet (50 mg total) by mouth every 4 (four) hours as needed for moderate pain.   vitamin B-12 500 MCG tablet Commonly known as:  CYANOCOBALAMIN Take 500 mcg by mouth daily.   warfarin 5 MG tablet Commonly known as:  COUMADIN Take 7.5 mg daily except 5 mg on Wednesday and Friday       Disposition   The patient will be discharged in stable condition to home. Discharge Instructions    (HEART FAILURE PATIENTS) Call MD:  Anytime you have any of the following symptoms: 1) 3 pound weight gain in 24 hours or 5 pounds in 1 week 2) shortness of breath, with or without a dry hacking cough 3) swelling in the hands, feet or stomach 4) if you have to sleep on extra pillows at night in order to breathe.   Complete by:  As directed    Amb Referral to Cardiac Rehabilitation   Complete by:  As directed    Diagnosis:  Heart Failure (see criteria below if ordering Phase II) Comment - LVAD   Heart Failure Type:  Chronic Systolic   Diet - low sodium heart healthy   Complete by:  As directed    Heart Failure patients record your daily weight using the same scale at the same time of day   Complete by:  As directed    INR  Goal: 2 - 2.5   Complete by:  As directed    Goal:  2 - 2.5  Increase activity slowly   Complete by:  As directed    Page VAD Coordinator at [phone]  Notify for: any VAD alarms, sustained elevations of power >10 watts, sustained drop in Pulse Index <3   Complete by:  As directed    Notify for:   any VAD alarms sustained elevations of power >10 watts sustained drop in Pulse Index <3     Speed Settings:   Complete by:  As directed    Fixed 5700 RPM Low 5400 RPM     Follow-up Information    Laurey Morale, MD Follow up on 08/18/2018.   Specialty:  Cardiology Why:  VAD follow up at 1000 Contact  information: [address] [place] [address] [phone]             Duration of Discharge Encounter: Greater than 35 minutes   Signed, Autumn Pruitt NP-C  08/12/2018, 10:24 AM

## 2018-08-11 NOTE — Progress Notes (Signed)
ANTICOAGULATION CONSULT NOTE   Pharmacy Consult for Warfarin Indication: LVAD  No Known Allergies  Patient Measurements: Height: 5\' 10"  (177.8 cm) Weight: 144 lb 6.4 oz (65.5 kg) IBW/kg (Calculated) : 73 Heparin Dosing Weight: 67.7 kg  Vital Signs: Temp: 98.2 F (36.8 C) (09/24 0705) Temp Source: Oral (09/24 0705) BP: 91/66 (09/24 0705) Pulse Rate: 45 (09/24 0705)  Labs: Recent Labs    08/09/18 0537 08/10/18 0500 08/10/18 1327 08/11/18 0440  HGB 8.7* 8.9* 9.2*  --   HCT 26.5* 27.7* 29.0*  --   PLT 250 328 355  --   LABPROT 19.2* 20.7*  --  19.7*  INR 1.63 1.79  --  1.68  CREATININE 0.55* 0.59*  --  0.63    Estimated Creatinine Clearance: 92.1 mL/min (by C-G formula based on SCr of 0.63 mg/dL).   Assessment: 57 y.o. male with RLE DVT on heparin preop now s/p LVAD 9/16. Pt is currently on warfarin post/op. INR down this morning to 1.68, LVAD speed increased yesterday. LDH stable.  Goal of Therapy:  INR goal 2-2.5 Monitor platelets by anticoagulation protocol: Yes   Plan:  -Warfarin 10mg  x1 tonight  -Daily INR, LDH, CBC  Fredonia Highland, PharmD, BCPS Clinical Pharmacist [phone] Please check AMION for all [facility] Pharmacy numbers 08/11/2018

## 2018-08-11 NOTE — Progress Notes (Signed)
Nutrition Follow-up  DOCUMENTATION CODES:   Non-severe (moderate) malnutrition in context of chronic illness  INTERVENTION:   -Continue Ensure Enlive po BID, each supplement provides 350 kcal and 20 grams of protein -Continue ferrous fumarate-B12-vitamin C- folic acid supplement daily  NUTRITION DIAGNOSIS:   Moderate Malnutrition related to chronic illness(CHF, cirrhosis) as evidenced by moderate fat depletion, moderate muscle depletion.  Ongoing  GOAL:   Patient will meet greater than or equal to 90% of their needs  Progressing  MONITOR:   PO intake, Supplement acceptance, Labs, Weight trends  REASON FOR ASSESSMENT:   Consult LVAD Eval  ASSESSMENT:   57 y/o male PMHx DM2, HTN/HLD, OSA on CPAP, CHF, Cirrhosis, ongoing Tobacco abuse, past etoh abuse (3 mo ago). Diagnosed w/ NICM 04/2018. Has had worsening edema despite titration of diuretics. Presented w/ 2-3 hx of acute increase of BLE edema, Orthopnea and PND. Admitted for AoC HF. With worsening HF, LVAD process started.   9/16 LVAD placed, destination therapy 9/17 Extubated  Reviewed I/O's: -1 L x 24 hours and -21.9 L since 07/28/18  Reviewed MD notes; pt will likely discharge home today after LVAD teaching.   Pt in with nursing and receiving nursing care at time of visit. Pt remains with good appetite; noted meal completion 40-100%.  Wt has been stable over the past month.   Labs reviewed: CBGS: 103-150 (inpatient orders for glycemic control are 0-24 units insulin aspart TID with meals).   Diet Order:   Diet Order            Diet Carb Modified Fluid consistency: Thin; Room service appropriate? Yes  Diet effective now              EDUCATION NEEDS:   Education needs have been addressed  Skin:  Skin Assessment: Skin Integrity Issues: Skin Integrity Issues:: Incisions Incisions: chest: sternotomy  Last BM:  08/10/18  Height:   Ht Readings from Last 1 Encounters:  07/21/18 5\' 10"  (1.778 m)     Weight:   Wt Readings from Last 1 Encounters:  08/11/18 65.5 kg    Ideal Body Weight:  75.45 kg  BMI:  Body mass index is 20.72 kg/m.  Estimated Nutritional Needs:   Kcal:  2100-2300 kcals   Protein:  105-120 g  Fluid:  1.5 L    Isidoro Santillana A. Mayford Knife, RD, LDN, CDE Pager: [phone] After hours Pager: [phone]

## 2018-08-11 NOTE — Progress Notes (Signed)
[id number] Pt stated he has walked twice today independently.  Resting now.  Stated he feels comfortable going from batteries to power source and back. Discussed CRP 2 and referred to [place].  Gave walking instructions and encouraged sternal precautions. Gave pt low sodium diet and encouraged adherence.  Luetta Nutting RN BSN 08/11/2018 1:52 PM

## 2018-08-11 NOTE — Progress Notes (Signed)
LVAD Coordinator Rounding Note:  Admitted 07/21/18 due to worsening of LE edema, orthopnea and PND over the past 2-3 days. Dr. Shirlee Latch consulted.   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 Laneta Simmers under DT criteria due to current smoker and positive THC.  Pt denies any complaints, has been up walking and feels "good".  Pt sitting up in bed, caregiver at bedside. Discharge planning scheduled at 2 pm today; caregiver will be in attendance.   Vital signs: Tmax: 98.0 HR: 105 Doppler: 92 Auto BP: 110/93 (101)  O2: 99% on RA Wt: 138.0>157.6>158.5>147.2>149.4>142>146>142>146>142 lbs   LVAD interrogation reveals:  Speed: 5400 Flow: 4.2 Power:  4.0w PI: 3.8 Alarms: none Events:  9 PI events on 9/22 Hematocrit: 27 Fixed speed: 5400 Low speed limit: 5100  Drive Line: Every other day dressing w/silver strip. Next dressing change due 08/11/18. Dressing may be changed by VAD Coordinator, Nurse Alla Feeling, or trained caregiver.     Labs:  LDH trend: 240>263>255>242>225>224>217  INR trend: 1.14>1.28>1.29>1.28>1.46>1.63>1.79  Anticoagulation Plan: -INR Goal: 2-2.5 -ASA Dose: 81 mg daily  Blood Products:  -intra op: 08/03/18 3 u FFP  Respiratory: Pt extubated 08/05/18  Gtts: Milrinone 0.125 mcg/kg/min off 08/07/18 Epi 0.5 mcg/min off 08/06/18   Adverse Events on VAD:  VAD Education: 1. Patient VAD Discharge Binder at bedside, encouraged pt and caregiver to complete reading.   2. Next dressing change due tomorrow; will ask [name] to perform under nursing supervision.   Plan/Recommendations:   1. VAD Coordinator, nurse champion, or trained caregiver to change drive line dressings every other day.  2. Continue to mobilize pt.  3. Call VAD coordinator for any equipment issues or drive line concerns.   Hessie Diener RN, BSN VAD Coordinator 24/7 Pager [phone]

## 2018-08-11 NOTE — Progress Notes (Addendum)
Patient ID: Theodore Welch, male   DOB: December 20, 1960, 57 y.o.   MRN: [id number]     Advanced Heart Failure Rounding Note  PCP-Cardiologist: No primary care provider on file.   Subjective:    Yesterday Ramp ECHO completed. Speed increased to 5700. INR 1.7   Wants to go home. Denies SOB.   HMIII Speed 5700 Flow 4.8  Power 4 PI 3 3.2     Objective:    Weight Range: 65.5 kg Body mass index is 20.72 kg/m.   Vital Signs:   Temp:  [98 F (36.7 C)-99.5 F (37.5 C)] 98.2 F (36.8 C) (09/24 0705) Pulse Rate:  [45-113] 45 (09/24 0705) Resp:  [17-30] 17 (09/24 0705) BP: (90-110)/(62-93) 91/66 (09/24 0705) SpO2:  [99 %-100 %] 99 % (09/24 0705) Weight:  [65.5 kg] 65.5 kg (09/24 0400) Last BM Date: 08/10/18  Weight change: Filed Weights   08/09/18 0412 08/10/18 0600 08/11/18 0400  Weight: 66.6 kg 64.8 kg 65.5 kg   Intake/Output:   Intake/Output Summary (Last 24 hours) at 08/11/2018 0815 Last data filed at 08/11/2018 0500 Gross per 24 hour  Intake 1930 ml  Output 3200 ml  Net -1270 ml    Physical Exam  Physical Exam: GENERAL: NAD. Sitting in the chair.  HEENT: normal  NECK: Supple, JVP  6-7 .  Carotids 2+ bilaterally, no bruits.  No lymphadenopathy or thyromegaly appreciated.   CARDIAC:  Mechanical heart sounds with LVAD hum present.  LUNGS:  Clear to auscultation bilaterally.  ABDOMEN:  Soft, round, nontender, positive bowel sounds x4.     LVAD exit site:   Dressing dry and intact.  No erythema or drainage.  Stabilization device present and accurately applied.EXTREMITIES:  Warm and dry, no cyanosis, clubbing, rash or edema  NEUROLOGIC:  Alert and oriented x 4.  Gait steady.  No aphasia.  No dysarthria.  Affect pleasant.        Telemetry   SR - ST 90-100s  With occasional PVCs  Labs    CBC Recent Labs    08/09/18 0537 08/10/18 0500 08/10/18 1327  WBC 10.2 9.9 12.7*  NEUTROABS 5.5 4.9  --   HGB 8.7* 8.9* 9.2*  HCT 26.5* 27.7* 29.0*  MCV 88.0 87.1 88.7  PLT 250  328 355   Basic Metabolic Panel Recent Labs    40/98/11 0537 08/10/18 0500 08/11/18 0440  NA 140 138 138  K 4.2 4.0 4.1  CL 104 101 101  CO2 25 27 26   GLUCOSE 90 87 136*  BUN 13 10 11   CREATININE 0.55* 0.59* 0.63  CALCIUM 9.3 9.5 9.8  MG 2.0 1.9  --   PHOS 4.1 4.7*  --    Liver Function Tests No results for input(s): AST, ALT, ALKPHOS, BILITOT, PROT, ALBUMIN in the last 72 hours. No results for input(s): LIPASE, AMYLASE in the last 72 hours. Cardiac Enzymes No results for input(s): CKTOTAL, CKMB, CKMBINDEX, TROPONINI in the last 72 hours.  BNP: BNP (last 3 results) Recent Labs    07/21/18 0540 08/04/18 0902 08/10/18 0026  BNP 1,343.9* 658.6* 537.0*    ProBNP (last 3 results) No results for input(s): PROBNP in the last 8760 hours.   D-Dimer No results for input(s): DDIMER in the last 72 hours. Hemoglobin A1C No results for input(s): HGBA1C in the last 72 hours. Fasting Lipid Panel No results for input(s): CHOL, HDL, LDLCALC, TRIG, CHOLHDL, LDLDIRECT in the last 72 hours. Thyroid Function Tests No results for input(s): TSH, T4TOTAL, T3FREE, THYROIDAB  in the last 72 hours.  Invalid input(s): FREET3  Other results:   Imaging    No results found.   Medications:     Scheduled Medications: . aspirin EC  81 mg Oral Daily  . bisacodyl  10 mg Oral Daily   Or  . bisacodyl  10 mg Rectal Daily  . digoxin  0.125 mg Oral Daily  . docusate sodium  200 mg Oral Daily  . DULoxetine  60 mg Oral Daily  . ferrous fumarate-b12-vitamic C-folic acid  1 capsule Oral TID PC  . furosemide  40 mg Oral BID  . gabapentin  600 mg Oral TID  . insulin aspart  0-24 Units Subcutaneous TID WC  . losartan  25 mg Oral Daily  . pantoprazole  40 mg Oral Daily  . spironolactone  12.5 mg Oral Daily  . thiamine  100 mg Oral Daily  . warfarin  10 mg Oral ONCE-1800  . Warfarin - Pharmacist Dosing Inpatient   Does not apply q1800    Infusions: . lactated ringers Stopped (08/04/18  0715)  . lactated ringers 10 mL/hr at 08/07/18 1200    PRN Medications: feeding supplement (ENSURE ENLIVE), hydrALAZINE, Influenza vac split quadrivalent PF, morphine injection, nicotine, ondansetron (ZOFRAN) IV, oxyCODONE, traMADol    Patient Profile   Theodore Welch is a 57 y.o. 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  Admitted with worsening SOB and orthopnea in setting of severe biventricular CHF.   Assessment/Plan   1. Acute on chronic systolic CHF:  Nonischemic cardiomyopathy based on cath 7/19 in [place]. Remote heavy ETOH, says he has quit drinking completely for about 2 months now. HIV negative.  No cocaine/amphetamines.  Echo was done this admission with biventricular failure, LV EF 10-20% with dilated RV. There is moderate to severe MR and severe TR with poor coaptation of TV leaflets. On exam, he was markedly volume overloaded initially.  Low output HF indicated by low Co-ox of 43% initially.  cMRI 07/23/18 LVEF EF 14%, Severe RF dysfunction (EF 25%) with nonspecific RV insertion site LGE pattern likely from pressure/volume overload. RHC showed good cardiac output on milrinone with filling pressures nearing normal.  PAPi 2.65 suggested that RV function is better than expected from imaging.  Echo on 9/10 after diuresis showed EF 20-25%, RV looked better, mild to moderate MR, moderate TR. 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 Ramp ECHO completed with speed increased to 5700.  CO-OX 67.5%. Renal function stable.  - Increase spiro 25 mg daily.  - Continue lasix 40 mg twice a day po.      - 325 mg ASA until INR 2.  - INR down to 1.7.  - He remains on digoxin, dig level 0.3  Should be able to stop soon.  2. OSA: In the community on CPAP.    3. COPD:  He had mostly quit smoking before admission but still smoked a few cigarettes/week. PFTs 07/24/18 FVC 3.19 (79%), FEV1 2.47 (78%), DLCO 53%, TLC 82%.  - No change to current  plan.   4. 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 ?component of ETOH cirrhosis given prior heavy ETOH (has now quit). RUQ US showed ascites with evidence for cirrhosis. NH3 with initial elevation at 64 on 9/3. CT abdomen showed resolution of ascites but there was evidence for cirrhosis. Bilirubin trended down after surgery.   5. RLE DVT:  He has a right peroneal vein (below knee)  DVT.  Even though below knee, think he is a relatively high risk patient with severe cardiomyopathy.  He is on anticoagulation for LVAD.   - On coumadin.  6. Valvular heart disease:  Moderate to severe MR and severe TR on initial echo.  Suspect this was functional due to annular dilation.  Repeat echo after diuresis on 9/10 showed mild-moderate MR, moderate TR.    7. VT: Run overnight 9/17 in setting of hypomagnesemia and aggressive diuresis.  No further VT.  Keep K> 4.0 MG > 2.0  Check Mag  8. Post-op anemia: Got 1 unit PRBCs on 9/21 with appropriate rise in hgb. CBC pending.   Home after INR therapeutic and after LVAD teaching is complete. He is able to perform self checks.    Tonye Becket, NP  08/11/2018 8:15 AM   Patient seen with NP, agree with the above note.  He is stable today, tolerated increased LVAD speed with no problems.  No dyspnea, overall feels good.   INR 1.7.  Plan for home tomorrow if INR > 1.8.  Will decrease ASA to 81 mg daily for home.   Volume status looks ok. MAP around 90, will increase spironolactone to 25 mg daily.   Marca Ancona 08/11/2018 1:38 PM

## 2018-08-11 NOTE — Progress Notes (Signed)
VAD Discharge Teaching Note:  Discharge VAD teaching completed with patient and caregiver [name]).  The home inspection checklist has been reviewed and no unsafe conditions have been identified. Family reports that there are at least two dedicated grounded, 3-prong outlets with clearly labeled circuit breaker has been established in the bedroom for power module and Magazine features editor.   Both patient and caregivers have been trained on the following:  1. HM III LVAD overview of system operations  2. Overview of major lifestyle accommodations and cautions   3. Overview of system components (features and functions) 4. Changing power sources 5. Overview of alerts and alarms 6. How to identify and manage an emergency including when pump is running and when pump has stopped  7. Changing system controller 8. Maintain emergency contact list and medications  The patient and caregiver(s) have successfully demonstrated:  1. Changing power source batteries to MPU and from MPU to batteries 2. Perform system controller self test  3. Check and charge batteries  4. Change system controller 5. Paged VAD pager and programmed number in phones  A daily flow sheet with patient  weight, temperature,  flow, speed, power, and PI, along with daily self checks on system controller has been performed by patient and caregiver(s) during hospitalization and will also be done daily at home.   The caregiver(s) has been trained on percutaneous lead exit site care, care of the driveline and dressing changes. She will perform dressing change today and tomorrow under VAD Coordinator or Nurse Alla Feeling supervision. The importance of lead immobilization has been stressed to patient and caregiver(s) using the attachment device. The caregiver has successfully demonstrated the following: 1. Cleansing site with sterile technique 2. Dressing care and maintenance  3. Immobilizing driveline  The following routine activities and  maintenance have been reviewed with patient and caregiver(s) and both verbalize understanding:  1. Stressed importance of never disconnecting power from both controller power leads at the same time, and never disconnecting both batteries at the same time, or the power will be supplied by controller back up battery for @ 15 minutes.  2. Plug the universal battery charger (UBC) into properly grounded (3 prong) outlets dedicated to VAD equipment use. Do NOT use adapter (cheater plug) for ungrounded outlets or multiple portable socket outlets (power strips) 3. Do not connect the MPU or UBC to an outlet controlled by wall switch or the device may not work 4. Keep a backup system controller, charged batteries, battery clips, ander 5. Transfer from MPU to batteries during [facility] mains power failure. Keep a flashlight near you at night in case of power outage. 8. Clean battery, battery clip, and universal battery charger contacts weekly 9. Visually inspect percutaneous lead daily 10. Check cables and connectors when changing power source  11. Rotate batteries; keep all eight batteries charged 12. Always have backup system controller, battery clips, fully charged batteries, and spare fully charged batteries when traveling 13. Re-calibrate batteries every 70 uses; monitor battery life of 36 months or 360 uses; replace batteries at end of battery life   Identified the following changes in activities of daily living with pump:  1. No driving for at least six weeks and then only if doctor gives    permission to do so 2. No tub baths while pump implanted, and shower only if doctor gives    permission 3. No swimming or submersion in water while implanted with pump 4. Keep all VAD equipment away from water or moisture 5. Keep all  VAD connections clean and dry 6. No contact sports or engage in jumping activities 7. Avoid strong static electricity (touching TV/computer screens, vacuuming) 8. Never have an MRI  while implanted with the pump 9. Never leave or store batteries in extremely hot or cold places (such as  trunk of your car), or the battery life will be shortened 10. Call the doctor or hospital contact person if any change in how the pump                  sounds, feels, or works 11. Plan to sleep only when connected to the power module. 12. Keep a backup system controller, charged batteries, battery clips, and             flashlight near you during sleep in case of electrical power outage 13. Do not sleep on your stomach 14. Talk with doctor before any long distance travel plans 15. Patient will need antibiotics prior to any dental procedure; instructed to contact VAD coordinator before any dental procedures (including routine cleaning)   Discharge binder given to patient and include the following:  1. Discharge instructions sheet 2. List of emergency contacts 3. Wallet card 4. HM III Luggage tags 5. HM III Alarms for Patients and their Caregivers 6. HM III Patient Handbook 7. HM III Patient Education Program DVD 8. Daily diary sheets 9. [place] VAD Patient Education Booklet   Discharge equipment includes:  1. Two system controllers 2. One mobile power unit (MPU) with 20' patient cable 3. One universal Magazine features editor (UBC) 4. Eight fully charged batteries  5. Four battery clips 6. One travel case 7. One battery holster 8. Wearable accessory package 9. 20 dressings and 5 anchors  Following notification process completed with: [facility] [employer]    Discussed frequency and importance of INR checks; emphasized importance of maintaining INR goal to prevent clotting and or bleeding issues with pump. Patient will have INR managed with the assistance of Home Health RN ([facility]) while on home milrinone; current INR goal is 2.0 - 2.5.    The patient has completed a proficiency test for the HM II and all questions have been answered. The  pt and family have been instructed to call if any questions, problems, or concerns arise. Pt and caregiver successfully paged VAD coordinator using VAD pager emergency number and have been instructed to use this number only for emergencies. Patient and caregiver(s) asked appropriate questions, had good interaction with VAD coordinator, and verbalized understanding of above instructions.   Total elapsed time: 3 hours  Hessie Diener, RN VAD Coordinator  Office: [phone] 24/7 VAD Pager: [phone]

## 2018-08-11 NOTE — Plan of Care (Signed)
  Problem: Education: Goal: Knowledge of General Education information will improve Description Including pain rating scale, medication(s)/side effects and non-pharmacologic comfort measures Outcome: Progressing   Problem: Health Behavior/Discharge Planning: Goal: Ability to manage health-related needs will improve Outcome: Progressing   Problem: Clinical Measurements: Goal: Ability to maintain clinical measurements within normal limits will improve Outcome: Progressing Goal: Will remain free from infection Outcome: Progressing Goal: Diagnostic test results will improve Outcome: Progressing Goal: Respiratory complications will improve Outcome: Progressing Goal: Cardiovascular complication will be avoided Outcome: Progressing   Problem: Activity: Goal: Risk for activity intolerance will decrease Outcome: Progressing   Problem: Nutrition: Goal: Adequate nutrition will be maintained Outcome: Progressing   Problem: Coping: Goal: Level of anxiety will decrease Outcome: Progressing   Problem: Elimination: Goal: Will not experience complications related to bowel motility Outcome: Progressing Goal: Will not experience complications related to urinary retention Outcome: Progressing   Problem: Pain Managment: Goal: General experience of comfort will improve Outcome: Progressing   Problem: Skin Integrity: Goal: Risk for impaired skin integrity will decrease Outcome: Progressing   Problem: Education: Goal: Ability to demonstrate management of disease process will improve Outcome: Progressing   Problem: Activity: Goal: Capacity to carry out activities will improve Outcome: Progressing   Problem: Cardiac: Goal: Ability to achieve and maintain adequate cardiopulmonary perfusion will improve Outcome: Progressing   Problem: Education: Goal: Knowledge of the prescribed therapeutic regimen will improve Outcome: Progressing   Problem: Activity: Goal: Risk for activity  intolerance will decrease Outcome: Progressing   Problem: Cardiac: Goal: Ability to maintain an adequate cardiac output will improve Outcome: Progressing   Problem: Coping: Goal: Level of anxiety will decrease Outcome: Progressing   Problem: Fluid Volume: Goal: Risk for excess fluid volume will decrease Outcome: Progressing   Problem: Clinical Measurements: Goal: Ability to maintain clinical measurements within normal limits will improve Outcome: Progressing Goal: Will remain free from infection Outcome: Progressing   Problem: Respiratory: Goal: Will regain and/or maintain adequate ventilation Outcome: Progressing

## 2018-08-12 ENCOUNTER — Encounter: Payer: Self-pay | Admitting: *Deleted

## 2018-08-12 LAB — BASIC METABOLIC PANEL
ANION GAP: 11 (ref 5–15)
BUN: 13 mg/dL (ref 6–20)
CHLORIDE: 99 mmol/L (ref 98–111)
CO2: 24 mmol/L (ref 22–32)
Calcium: 9.6 mg/dL (ref 8.9–10.3)
Creatinine, Ser: 0.72 mg/dL (ref 0.61–1.24)
Glucose, Bld: 187 mg/dL — ABNORMAL HIGH (ref 70–99)
POTASSIUM: 4 mmol/L (ref 3.5–5.1)
SODIUM: 134 mmol/L — AB (ref 135–145)

## 2018-08-12 LAB — CBC
HEMATOCRIT: 29.4 % — AB (ref 39.0–52.0)
HEMOGLOBIN: 9.3 g/dL — AB (ref 13.0–17.0)
MCH: 28.2 pg (ref 26.0–34.0)
MCHC: 31.6 g/dL (ref 30.0–36.0)
MCV: 89.1 fL (ref 78.0–100.0)
Platelets: 352 10*3/uL (ref 150–400)
RBC: 3.3 MIL/uL — AB (ref 4.22–5.81)
RDW: 14.9 % (ref 11.5–15.5)
WBC: 14.2 10*3/uL — AB (ref 4.0–10.5)

## 2018-08-12 LAB — COOXEMETRY PANEL
Carboxyhemoglobin: 1.7 % — ABNORMAL HIGH (ref 0.5–1.5)
Methemoglobin: 1.5 % (ref 0.0–1.5)
O2 Saturation: 73.6 %
Total hemoglobin: 9.6 g/dL — ABNORMAL LOW (ref 12.0–16.0)

## 2018-08-12 LAB — LACTATE DEHYDROGENASE: LDH: 233 U/L — AB (ref 98–192)

## 2018-08-12 LAB — GLUCOSE, CAPILLARY
Glucose-Capillary: 83 mg/dL (ref 70–99)
Glucose-Capillary: 114 mg/dL — ABNORMAL HIGH (ref 70–99)

## 2018-08-12 LAB — PROTIME-INR
INR: 2.06
Prothrombin Time: 23 seconds — ABNORMAL HIGH (ref 11.4–15.2)

## 2018-08-12 MED ORDER — SPIRONOLACTONE 25 MG PO TABS: 25.0000 mg | ORAL_TABLET | Freq: Every day | ORAL | 6 refills | Status: DC

## 2018-08-12 MED ORDER — WARFARIN SODIUM 5 MG PO TABS: ORAL_TABLET | ORAL | 6 refills | Status: DC

## 2018-08-12 MED ORDER — OXYCODONE HCL 5 MG PO TABS: 5.0000 mg | ORAL_TABLET | Freq: Three times a day (TID) | ORAL | 0 refills | Status: DC | PRN

## 2018-08-12 MED ORDER — LOSARTAN POTASSIUM 25 MG PO TABS: 25.0000 mg | ORAL_TABLET | Freq: Every day | ORAL | 6 refills | Status: DC

## 2018-08-12 MED ORDER — TRAMADOL HCL 50 MG PO TABS: 50.0000 mg | ORAL_TABLET | ORAL | 0 refills | Status: DC | PRN

## 2018-08-12 MED ORDER — NICOTINE 7 MG/24HR TD PT24: 7.0000 mg | MEDICATED_PATCH | Freq: Every day | TRANSDERMAL | 0 refills | Status: DC | PRN

## 2018-08-12 MED ORDER — WARFARIN SODIUM 5 MG PO TABS: 5.0000 mg | ORAL_TABLET | Freq: Once | ORAL | Status: AC

## 2018-08-12 MED ORDER — WARFARIN SODIUM 5 MG PO TABS
5.0000 mg | ORAL_TABLET | Freq: Once | ORAL | Status: AC
  Administered 2018-08-12: 5 mg via ORAL
  Filled 2018-08-12: qty 1

## 2018-08-12 MED ORDER — DIGOXIN 125 MCG PO TABS: 0.1250 mg | ORAL_TABLET | Freq: Every day | ORAL | 6 refills | Status: DC

## 2018-08-12 MED ORDER — FUROSEMIDE 40 MG PO TABS: 40.0000 mg | ORAL_TABLET | Freq: Two times a day (BID) | ORAL | 6 refills | Status: DC

## 2018-08-12 MED ORDER — WARFARIN SODIUM 5 MG PO TABS: 5.0000 mg | ORAL_TABLET | Freq: Once | ORAL | Status: DC

## 2018-08-12 MED ORDER — DOCUSATE SODIUM 100 MG PO CAPS: 200.0000 mg | ORAL_CAPSULE | Freq: Every day | ORAL | 0 refills | Status: AC | PRN

## 2018-08-12 NOTE — Progress Notes (Signed)
Per MD order, PICC line removed. Cath intact at 39cm. Vaseline pressure gauze to site, pressure held x 5min. No bleeding to site. Pt instructed to keep dressing CDI x 24 hours. Avoid heavy lifting, pushing or pulling x 24 hours,  If bleeding occurs hold pressure, if bleeding does not stop contact MD or go to the ED. Pt does not have any questions. Margie Urbanowicz M  

## 2018-08-12 NOTE — Progress Notes (Signed)
Occupational Therapy Treatment and Discharge Patient Details Name: Theodore Welch MRN: [id number] DOB: 03-06-1961 Today's Date: 08/12/2018    History of present illness Pt is a 57 y.o. male admitted 07/21/18 with worsening SOB and orthopnea in setting of severe biventricular CHF. Now s/p LVAD on 9/16. PMH includes CHF, DDD, chronic back pain.   OT comments  Pt is functioning modified independently in ADL and mobility. He demonstrated ability to change LVAD power sources independently. He is eager to go home today. All OT goals met.  Follow Up Recommendations  No OT follow up    Equipment Recommendations  3 in 1 bedside commode    Recommendations for Other Services      Precautions / Restrictions Precautions Precautions: Sternal;Fall Precaution Comments: reviewed sternal precautions related to IADL       Mobility Bed Mobility               General bed mobility comments: pt OOB upon arrival  Transfers Overall transfer level: Independent Equipment used: 4-wheeled walker                  Balance             Standing balance-Leahy Scale: Good Standing balance comment: can release walker and ambulate short distances steadily                           ADL either performed or assessed with clinical judgement   ADL Overall ADL's : Modified independent                     Lower Body Dressing: Modified independent Lower Body Dressing Details (indicate cue type and reason): pt able to cross foot over opposite knee to reach feet Toilet Transfer: Modified Independent;RW;Ambulation   Toileting- Clothing Manipulation and Hygiene: Modified independent;Sit to/from stand       Functional mobility during ADLs: Modified independent;Rolling walker General ADL Comments: pt recalling to bring black bag with correct devices inside, able to state what to do in the event of a power outtage at home     Vision       Perception     Praxis       Cognition Arousal/Alertness: Awake/alert Behavior During Therapy: WFL for tasks assessed/performed Overall Cognitive Status: Within Functional Limits for tasks assessed                                          Exercises     Shoulder Instructions       General Comments      Pertinent Vitals/ Pain       Pain Assessment: No/denies pain  Home Living                                          Prior Functioning/Environment              Frequency           Progress Toward Goals  OT Goals(current goals can now be found in the care plan section)  Progress towards OT goals: Goals met/education completed, patient discharged from OT  Acute Rehab OT Goals Patient Stated Goal: to live for his grandchildren  Plan All goals met and education completed,  patient discharged from OT services    Co-evaluation                 AM-PAC PT "6 Clicks" Daily Activity     Outcome Measure   Help from another person eating meals?: None Help from another person taking care of personal grooming?: None Help from another person toileting, which includes using toliet, bedpan, or urinal?: None Help from another person bathing (including washing, rinsing, drying)?: None Help from another person to put on and taking off regular upper body clothing?: None Help from another person to put on and taking off regular lower body clothing?: None 6 Click Score: 24    End of Session Equipment Utilized During Treatment: Rolling walker  OT Visit Diagnosis: Unsteadiness on feet (R26.81);Other abnormalities of gait and mobility (R26.89)   Activity Tolerance Patient tolerated treatment well   Patient Left in chair;with call bell/phone within reach;with family/visitor present;with nursing/sitter in room   Nurse Communication          Time: [phone] OT Time Calculation (min): 14 min  Charges: OT General Charges $OT Visit: 1 Visit OT Treatments $Self  Care/Home Management : 8-22 mins  Nestor Lewandowsky, OTR/L Acute Rehabilitation Services Pager: [phone] Office: [phone]   Malka So 08/12/2018, 9:29 AM

## 2018-08-12 NOTE — Progress Notes (Addendum)
Patient ID: Theodore Welch, male   DOB: October 13, 1961, 57 y.o.   MRN: [id number]     Advanced Heart Failure Rounding Note  PCP-Cardiologist: No primary care provider on file.   Subjective:    INR 2.1   Denies SOB.. Want to go home.  Having some soreness.   HMIII Speed 5700 Flow 5  Power 4 PI 3.1 Rare PI events.    Objective:    Weight Range: 65.5 kg Body mass index is 20.72 kg/m.   Vital Signs:   Temp:  [97.7 F (36.5 C)-98.6 F (37 C)] 98.4 F (36.9 C) (09/25 0700) Pulse Rate:  [74-103] 95 (09/25 0700) Resp:  [16-30] 16 (09/25 0700) BP: (98-103)/(76-93) 98/87 (09/25 0700) SpO2:  [97 %-98 %] 97 % (09/25 0337) Weight:  [65.5 kg] 65.5 kg (09/25 0232) Last BM Date: 08/11/18  Weight change: Filed Weights   08/10/18 0600 08/11/18 0400 08/12/18 0232  Weight: 64.8 kg 65.5 kg 65.5 kg   Intake/Output:   Intake/Output Summary (Last 24 hours) at 08/12/2018 0730 Last data filed at 08/12/2018 0337 Gross per 24 hour  Intake 720 ml  Output 1700 ml  Net -980 ml    Physical Exam   Physical Exam: GENERAL:NAD. Sitting in the chair.  HEENT: normal  NECK: Supple, JVP 5-6  . Carotids 2+ bilaterally, no bruits.  No lymphadenopathy or thyromegaly appreciated.   CARDIAC:  Mechanical heart sounds with LVAD hum present.  LUNGS:  Clear to auscultation bilaterally.  ABDOMEN:  Soft, round, nontender, positive bowel sounds x4.     LVAD exit site: Dressing dry and intact.  No erythema or drainage.  Stabilization device present and accurately applied.  Driveline dressing is being changed daily per sterile technique. EXTREMITIES:  Warm and dry, no cyanosis, clubbing, rash or edema  NEUROLOGIC:  Alert and oriented x 4.    Affect pleasant.         Telemetry   SR-ST 90-100s   Labs    CBC Recent Labs    08/10/18 0500  08/11/18 0832 08/12/18 0346  WBC 9.9   < > 12.8* 14.2*  NEUTROABS 4.9  --   --   --   HGB 8.9*   < > 9.6* 9.3*  HCT 27.7*   < > 30.1* 29.4*  MCV 87.1   < > 88.5  89.1  PLT 328   < > 368 352   < > = values in this interval not displayed.   Basic Metabolic Panel Recent Labs    27/25/36 0500 08/11/18 0440 08/11/18 0832 08/12/18 0346  NA 138 138  --  134*  K 4.0 4.1  --  4.0  CL 101 101  --  99  CO2 27 26  --  24  GLUCOSE 87 136*  --  187*  BUN 10 11  --  13  CREATININE 0.59* 0.63  --  0.72  CALCIUM 9.5 9.8  --  9.6  MG 1.9  --  2.0  --   PHOS 4.7*  --   --   --    Liver Function Tests No results for input(s): AST, ALT, ALKPHOS, BILITOT, PROT, ALBUMIN in the last 72 hours. No results for input(s): LIPASE, AMYLASE in the last 72 hours. Cardiac Enzymes No results for input(s): CKTOTAL, CKMB, CKMBINDEX, TROPONINI in the last 72 hours.  BNP: BNP (last 3 results) Recent Labs    07/21/18 0540 08/04/18 0902 08/10/18 0026  BNP 1,343.9* 658.6* 537.0*    ProBNP (last 3  results) No results for input(s): PROBNP in the last 8760 hours.   D-Dimer No results for input(s): DDIMER in the last 72 hours. Hemoglobin A1C No results for input(s): HGBA1C in the last 72 hours. Fasting Lipid Panel No results for input(s): CHOL, HDL, LDLCALC, TRIG, CHOLHDL, LDLDIRECT in the last 72 hours. Thyroid Function Tests No results for input(s): TSH, T4TOTAL, T3FREE, THYROIDAB in the last 72 hours.  Invalid input(s): FREET3  Other results:   Imaging    No results found.   Medications:     Scheduled Medications: . aspirin EC  81 mg Oral Daily  . bisacodyl  10 mg Oral Daily   Or  . bisacodyl  10 mg Rectal Daily  . digoxin  0.125 mg Oral Daily  . docusate sodium  200 mg Oral Daily  . DULoxetine  60 mg Oral Daily  . feeding supplement (ENSURE ENLIVE)  237 mL Oral BID BM  . ferrous fumarate-b12-vitamic C-folic acid  1 capsule Oral TID PC  . furosemide  40 mg Oral BID  . gabapentin  600 mg Oral TID  . insulin aspart  0-24 Units Subcutaneous TID WC  . losartan  25 mg Oral Daily  . pantoprazole  40 mg Oral Daily  . spironolactone  25 mg Oral  Daily  . thiamine  100 mg Oral Daily  . warfarin  5 mg Oral ONCE-1800  . Warfarin - Pharmacist Dosing Inpatient   Does not apply q1800    Infusions: . lactated ringers Stopped (08/04/18 0715)  . lactated ringers 10 mL/hr at 08/07/18 1200    PRN Medications: hydrALAZINE, Influenza vac split quadrivalent PF, morphine injection, nicotine, ondansetron (ZOFRAN) IV, oxyCODONE, traMADol    Patient Profile   Theodore Welch is a 57 y.o. 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  Admitted with worsening SOB and orthopnea in setting of severe biventricular CHF.   Assessment/Plan   1. Acute on chronic systolic CHF:  Nonischemic cardiomyopathy based on cath 7/19 in [place]. Remote heavy ETOH, says he has quit drinking completely for about 2 months now. HIV negative.  No cocaine/amphetamines.  Echo was done this admission with biventricular failure, LV EF 10-20% with dilated RV. There is moderate to severe MR and severe TR with poor coaptation of TV leaflets. On exam, he was markedly volume overloaded initially.  Low output HF indicated by low Co-ox of 43% initially.  cMRI 07/23/18 LVEF EF 14%, Severe RF dysfunction (EF 25%) with nonspecific RV insertion site LGE pattern likely from pressure/volume overload. RHC showed good cardiac output on milrinone with filling pressures nearing normal.  PAPi 2.65 suggested that RV function is better than expected from imaging.  Echo on 9/10 after diuresis showed EF 20-25%, RV looked better, mild to moderate MR, moderate TR. 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 Ramp ECHO completed with speed increased to 5700.  CO-OX 74%. Renal function stable.  - Continue spiro 25 mg daily.  - Continue lasix 40 mg twice a day po.      - on 81 mg asa.  - INR 2.1  - He remains on digoxin, dig level 0.3  Should be able to stop soon.  2. OSA: In the community on CPAP.    3. COPD:  He had mostly quit smoking before  admission but still smoked a few cigarettes/week. PFTs 07/24/18 FVC 3.19 (79%), FEV1 2.47 (78%), DLCO 53%, TLC 82%.  - No change to current plan.  4. 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 ?component of ETOH cirrhosis given prior heavy ETOH (has now quit). RUQ US showed ascites with evidence for cirrhosis. NH3 with initial elevation at 64 on 9/3. CT abdomen showed resolution of ascites but there was evidence for cirrhosis. Bilirubin trended down after surgery.   5. RLE DVT:  He has a right peroneal vein (below knee) DVT.  Even though below knee, think he is a relatively high risk patient with severe cardiomyopathy.  He is on anticoagulation for LVAD.   - On coumadin.  6. Valvular heart disease:  Moderate to severe MR and severe TR on initial echo.  Suspect this was functional due to annular dilation.  Repeat echo after diuresis on 9/10 showed mild-moderate MR, moderate TR.    7. VT: Run overnight 9/17 in setting of hypomagnesemia and aggressive diuresis.  No further VT.  Keep K> 4.0 MG > 2.0  Mag and K ok.  8. Post-op anemia: Got 1 unit PRBCs on 9/21 with appropriate rise in hgb. Hgb 9.3.   Home today. Follow up early next week.   Tonye Becket, NP  08/12/2018 7:30 AM   Patient seen with NP, agree with the above note.  He is doing very well today.  No dyspnea.  INR therapeutic.  He looks euvolemic on exam. Weight stable.    I think he is ready to go home.  Will have close followup in LVAD clinic.   Meds for discharge: warfarin per pharmacist, ASA 81, Lasix 40 qam/20 qpm, spironolactone 25 daily, losartan 25 mg daily, digoxin 0.125 daily (will likely stop at followup), gabapentin.   Marca Ancona 08/12/2018 9:05 AM

## 2018-08-12 NOTE — Progress Notes (Signed)
ANTICOAGULATION CONSULT NOTE   Pharmacy Consult for Warfarin Indication: LVAD  No Known Allergies  Patient Measurements: Height: 5\' 10"  (177.8 cm) Weight: 144 lb 6.4 oz (65.5 kg) IBW/kg (Calculated) : 73 Heparin Dosing Weight: 67.7 kg  Vital Signs: Temp: 98.6 F (37 C) (09/25 0337) Temp Source: Oral (09/25 0337) BP: 102/86 (09/25 0337) Pulse Rate: 103 (09/25 0337)  Labs: Recent Labs    08/10/18 0500 08/10/18 1327 08/11/18 0440 08/11/18 0832 08/12/18 0346  HGB 8.9* 9.2*  --  9.6* 9.3*  HCT 27.7* 29.0*  --  30.1* 29.4*  PLT 328 355  --  368 352  LABPROT 20.7*  --  19.7*  --  23.0*  INR 1.79  --  1.68  --  2.06  CREATININE 0.59*  --  0.63  --  0.72    Estimated Creatinine Clearance: 92.1 mL/min (by C-G formula based on SCr of 0.72 mg/dL).   Assessment: 57 y.o. male with RLE DVT on heparin preop now s/p LVAD 9/16. Pt is currently on warfarin post/op. INR now therapeutic this morning at 2.06, CBC and LDH stable. Pt likely to be discharged home today, see recommendations below.  Goal of Therapy:  INR goal 2-2.5 Monitor platelets by anticoagulation protocol: Yes   Plan:  -Would discharge with 5mg  tablets and INR follow-up early next week (9/30 or 10/1) -Warfarin 5mg  Wed/Fri, 7.5mg  all other days -Warfarin education has been completed  Fredonia Highland, PharmD, BCPS Clinical Pharmacist [phone] Please check AMION for all [facility] numbers 08/12/2018

## 2018-08-12 NOTE — Care Management Note (Signed)
Case Management Note  Patient Details  Name: Theodore Welch MRN: [id number] Date of Birth: 06/29/1961  Subjective/Objective:   PT admitted with advanced HF - tentative plan is for pt to receive LVAD                 Action/Plan:  PTA Independent from home.  Pt has PCP and denies barriers with obtaining and paying for medications.     Expected Discharge Date:  08/12/18               Expected Discharge Plan:     In-House Referral:  Clinical Social Work  Discharge planning Services  CM Consult  Post Acute Care Choice:    Choice offered to:     DME Arranged:  3-N-1 DME Agency:  [facility]  HH Arranged:    HH Agency:     Status of Service:  Completed, signed off  If discussed at [employer], dates discussed:    Additional Comments: 08/12/2018  Pt is stable for discharge home, support at bedside.  HF team confirmed that pt does not require any HH at discharge, DME agency choice given - [facility] chosen and referral accepted.  Pt confirmed active relationship with PCP and denied barriers with obtaining/paying for medications.  No other CM needs determined at the time this note was written  07/29/18 Plan is for LVAD 08/03/18 Cherylann Parr, RN 08/12/2018, 2:14 PM

## 2018-08-12 NOTE — Progress Notes (Signed)
CSW met with pt and fiance to check in.  Reports that they plan on going to home today and is feeling good about taking care of the LVAD at home.  Caregiver reports increased confidence with dressing changes but is planning on doing it again today with VAD coordinator to make sure.  CSW will continue to follow patient in [place], [place] Social Worker [phone]

## 2018-08-12 NOTE — Progress Notes (Signed)
Patient provided with D/C instructions: patient and family verbalized understanding; Prescriptions provided; all belongings packed; PICC line removed per IV team; Kirt Boys, LVAD coordinator spoke with family and patient and reviewed dressing change as well as emergency numbers to call; patient vital signs stable at D/C; no signs of infections; drive line and PICC sites without signs of infection; patient ambulated with RN's to private vehicle with belongings.

## 2018-08-12 NOTE — Progress Notes (Addendum)
  LVAD Coordinator Rounding Note:  Admitted 07/21/18 due to worsening of LE edema, orthopnea and PND over the past 2-3 days. Dr. Shirlee Latch consulted.   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 Laneta Simmers under DT criteria due to current smoker and positive THC.  Pt sitting up in bedside, Theodore Welch (caregiver) at bedside. Reviewed discharge plan and all questions answered.    Vital signs: Tmax: 98.4 HR: 97 Doppler:  86 Auto BP:  103/82 (90)  O2: 98% on RA Wt: 138.0>157.6>158.5>147.2>149.4>142>146>142>146>142>144>144>142 lbs   LVAD interrogation reveals:  Speed: 5700 Flow: 4.7 Power:  4.2w PI: 3.7 Alarms: none Events: none Hematocrit: 27 Fixed speed: 5700 Low speed limit: 5400  Drive Line:  Existing VAD dressing removed and site care performed using sterile technique per Theodore Welch (caregiver) under VAD Coordinator observation. Drive line exit site cleaned with Chlora prep applicators x 2, allowed to dry, and gauze dressing with silver strip re-applied. Exit site unincorporated; one suture remains intact, the velour is fully implanted at exit site. Small amount bloody drainage; no redness, tenderness, foul odor or rash noted. Drive line anchor x 2 replaced.  I removed CT sutures, areas cleaned, and bandaids placed over sites.   Labs:  LDH trend: 240>263>255>242>225>224>217>227>>233  INR trend: 1.14>1.28>1.29>1.28>1.46>1.63>1.79>1.68>2.06  Anticoagulation Plan: -INR Goal: 2-2.5 -ASA Dose: 81 mg daily  Blood Products:  -intra op: 08/03/18 3 u FFP  Respiratory: Pt extubated 08/05/18  Gtts: Milrinone 0.125 mcg/kg/min off 08/07/18 Epi 0.5 mcg/min off 08/06/18   Adverse Events on VAD:  VAD Education: 1.  Patient and caregiver completed VAD discharge education yesterday.  2.  Theodore Welch performed dressing change today while I observed. She did very well with sterile technique and verbalized understanding and demonstrated appropriate steps for changing dressing.  3. Theodore Welch practiced paging VAD  pager again today and has no problems/questions about same.  4. Planned discharge home today; re-enforced pt/caregiver may page me with any questions or concerns when they get home. 5. Stressed importance of picking up warfarin Rx and not missing any doses - both verbalized understanding of same.   Plan/Recommendations:  1. Caregiver will perform dressing change today.  2. Pt will be discharged home today.  3. Call VAD coordinator for any equipment issues or drive line concerns.  Hessie Diener RN, BSN VAD Coordinator 24/7 Pager [phone]

## 2018-08-14 ENCOUNTER — Other Ambulatory Visit (HOSPITAL_COMMUNITY): Payer: Self-pay | Admitting: *Deleted

## 2018-08-14 ENCOUNTER — Telehealth (HOSPITAL_COMMUNITY): Payer: Self-pay

## 2018-08-14 ENCOUNTER — Emergency Department (HOSPITAL_COMMUNITY)
Admission: EM | Admit: 2018-08-14 | Discharge: 2018-08-15 | Disposition: A | Discharge status: Home / Self Care | Payer: Medicare Other | Attending: Emergency Medicine | Admitting: Emergency Medicine

## 2018-08-14 DIAGNOSIS — D72829 Elevated white blood cell count, unspecified: Secondary | ICD-10-CM | POA: Diagnosis not present

## 2018-08-14 DIAGNOSIS — F17210 Nicotine dependence, cigarettes, uncomplicated: Secondary | ICD-10-CM | POA: Diagnosis not present

## 2018-08-14 DIAGNOSIS — I509 Heart failure, unspecified: Secondary | ICD-10-CM | POA: Insufficient documentation

## 2018-08-14 DIAGNOSIS — R040 Epistaxis: Secondary | ICD-10-CM

## 2018-08-14 DIAGNOSIS — Z7982 Long term (current) use of aspirin: Secondary | ICD-10-CM | POA: Diagnosis not present

## 2018-08-14 DIAGNOSIS — Z79899 Other long term (current) drug therapy: Secondary | ICD-10-CM | POA: Insufficient documentation

## 2018-08-14 DIAGNOSIS — Z7984 Long term (current) use of oral hypoglycemic drugs: Secondary | ICD-10-CM | POA: Diagnosis not present

## 2018-08-14 DIAGNOSIS — Z95811 Presence of heart assist device: Secondary | ICD-10-CM

## 2018-08-14 DIAGNOSIS — Z7901 Long term (current) use of anticoagulants: Secondary | ICD-10-CM | POA: Insufficient documentation

## 2018-08-14 NOTE — Telephone Encounter (Signed)
Pt insurance is active and benefits verified through Medicare A/B. Co-pay $0.00, DED $185.00/$185.00 met, out of pocket $0.00/$0.00 met, co-insurance 20%. No pre-authorization. Passport, 08/14/18 @ 9:07AM, MOQ#[id number]  Patient insurance is active through [place]. CLE#[id number]  Will fax over [facility] Reimbursement form to Dr. [name].  Will contact patient to see if he is interested in the Cardiac Rehab Program. If interested, patient will need to complete follow up appt. Once completed, patient will be contacted for scheduling upon review by the RN Navigator.

## 2018-08-14 NOTE — Telephone Encounter (Signed)
Attempted to call patient in regards to Cardiac Rehab - LM on VM 

## 2018-08-15 ENCOUNTER — Encounter (HOSPITAL_COMMUNITY): Payer: Self-pay | Admitting: Emergency Medicine

## 2018-08-15 ENCOUNTER — Other Ambulatory Visit: Payer: Self-pay

## 2018-08-15 ENCOUNTER — Telehealth (HOSPITAL_COMMUNITY): Payer: Self-pay | Admitting: *Deleted

## 2018-08-15 DIAGNOSIS — Z95811 Presence of heart assist device: Secondary | ICD-10-CM

## 2018-08-15 DIAGNOSIS — R040 Epistaxis: Secondary | ICD-10-CM | POA: Diagnosis not present

## 2018-08-15 DIAGNOSIS — D72828 Other elevated white blood cell count: Secondary | ICD-10-CM

## 2018-08-15 LAB — PROTIME-INR
INR: 2.17
PROTHROMBIN TIME: 24 s — AB (ref 11.4–15.2)

## 2018-08-15 LAB — CBC
HCT: 30.2 % — ABNORMAL LOW (ref 39.0–52.0)
Hemoglobin: 9.6 g/dL — ABNORMAL LOW (ref 13.0–17.0)
MCH: 27.9 pg (ref 26.0–34.0)
MCHC: 31.8 g/dL (ref 30.0–36.0)
MCV: 87.8 fL (ref 78.0–100.0)
PLATELETS: 432 10*3/uL — AB (ref 150–400)
RBC: 3.44 MIL/uL — AB (ref 4.22–5.81)
RDW: 14.6 % (ref 11.5–15.5)
WBC: 17.1 10*3/uL — AB (ref 4.0–10.5)

## 2018-08-15 LAB — LACTATE DEHYDROGENASE: LDH: 210 U/L — ABNORMAL HIGH (ref 98–192)

## 2018-08-15 MED ORDER — OXYMETAZOLINE HCL 0.05 % NA SOLN
2.0000 | Freq: Once | NASAL | Status: AC
  Administered 2018-08-15: 2 via NASAL
  Filled 2018-08-15: qty 15

## 2018-08-15 MED ORDER — DOXYCYCLINE HYCLATE 50 MG PO CAPS: 100.0000 mg | ORAL_CAPSULE | Freq: Two times a day (BID) | ORAL | 0 refills | Status: AC

## 2018-08-15 NOTE — ED Notes (Signed)
BP 90/palp

## 2018-08-15 NOTE — ED Notes (Signed)
Charge RN notified of LVAD pt

## 2018-08-15 NOTE — ED Triage Notes (Signed)
PT states he was discharged from hospital on Wednesday.  C/o nosebleed (R nostril x 45 min).

## 2018-08-15 NOTE — ED Notes (Signed)
Spoke with Hessie Diener, VAD coordinator and lab orders received.

## 2018-08-15 NOTE — Telephone Encounter (Signed)
Pt presented to ED last night with nosebleed. ED MD called to report it had stopped after Afrin and pressure x 30 mins. He did report WBCs were 17 (were 14 at discharge). Pt has had no signs of infection. Dr. Shirlee Latch updated - will send Rx for Doxy 100 mg twice daily for 7 days.   Called pt and caregiver - left messages on both cell phones. Ask them to call VAD pager if any questions.  Hessie Diener RN, VAD Coordinator [phone]

## 2018-08-15 NOTE — Discharge Instructions (Addendum)
You may use nasal saline to keep your mucous membranes moist. You may use a humidifier.  Other than nasal saline, please do not put anything into your nose for the next 3-4 days. Please do not blow your nose for the next 3-4 days. If your nose begins to bleed again, at this time it is okay to blow your nose and blow out all of the clots and then spray Afrin nasal spray into both nostrils and hold direct pressure for 30 minutes without stopping. If this does not stop the bleeding, please return to the hospital.  

## 2018-08-15 NOTE — ED Notes (Signed)
Rapid response nurse notified of LVAD pt.

## 2018-08-15 NOTE — ED Provider Notes (Signed)
TIME SEEN: 12:41 AM  CHIEF COMPLAINT: Right-sided epistaxis  HPI: Patient is a 57 year old male with history of cardiomyopathy status post LVAD 08/03/18 who presents to the emergency department with right-sided epistaxis that started at 9:30 PM.  No injury to the nose.  Has not been putting anything into his nose.  No recent nasal or sinus surgery.  He is on Coumadin.  Has been holding pressure underneath his nose intermittently but nothing constant.  Continues to have a slow drip.  ROS: See HPI Constitutional: no fever  Eyes: no drainage  ENT: no runny nose   Cardiovascular:  no chest pain  Resp: no SOB  GI: no vomiting GU: no dysuria Integumentary: no rash  Allergy: no hives  Musculoskeletal: no leg swelling  Neurological: no slurred speech ROS otherwise negative  PAST MEDICAL HISTORY/PAST SURGICAL HISTORY:  Past Medical History:  Diagnosis Date  . Cardiomyopathy, unspecified (HCC)   . CHF (congestive heart failure) (HCC)   . Chronic back pain   . Enlarged heart   . Gastric ulcer   . Gastroenteritis   . H/O degenerative disc disease     MEDICATIONS:  Prior to Admission medications   Medication Sig Start Date End Date Taking? Authorizing Provider  aspirin EC 81 MG tablet Take 81 mg by mouth daily.    [provider]  atorvastatin (LIPITOR) 20 MG tablet Take 10 mg by mouth daily.     [provider]  carbamide peroxide (DEBROX) 6.5 % otic solution Place 5 drops into both ears 3 (three) times daily. 5-10 drops    [provider]  clotrimazole (LOTRIMIN) 1 % external solution Apply 1 application topically daily.    [provider]  cyanocobalamin 500 MCG tablet Take 500 mcg by mouth daily.    [provider]  digoxin (LANOXIN) 0.125 MG tablet Take 1 tablet (0.125 mg total) by mouth daily. 08/13/18   Clegg, Amy D, NP  docusate sodium (COLACE) 100 MG capsule Take 2 capsules (200 mg total) by mouth daily as needed for mild constipation.  08/12/18   Clegg, Amy D, NP  DULoxetine (CYMBALTA) 60 MG capsule Take 60 mg by mouth daily.    [provider]  fluticasone (FLONASE) 50 MCG/ACT nasal spray Place 1 spray into both nostrils 2 (two) times daily as needed for allergies.     [provider]  furosemide (LASIX) 40 MG tablet Take 1 tablet (40 mg total) by mouth 2 (two) times daily. 08/12/18   Clegg, Amy D, NP  gabapentin (NEURONTIN) 300 MG capsule Take 600 mg by mouth 3 (three) times daily.    [provider]  hydrOXYzine (VISTARIL) 25 MG capsule Take 25 mg by mouth 3 (three) times daily as needed for anxiety.    [provider]  Ipratropium-Albuterol (COMBIVENT) 20-100 MCG/ACT AERS respimat Inhale 1 puff into the lungs every 6 (six) hours.    [provider]  losartan (COZAAR) 25 MG tablet Take 1 tablet (25 mg total) by mouth daily. 08/13/18   Clegg, Amy D, NP  metFORMIN (GLUCOPHAGE-XR) 500 MG 24 hr tablet Take 500 mg by mouth 2 (two) times daily.    [provider]  methocarbamol (ROBAXIN) 750 MG tablet Take 750 mg by mouth 3 (three) times daily as needed for muscle spasms.  02/21/14   [provider]  nicotine (NICODERM CQ - DOSED IN MG/24 HR) 7 mg/24hr patch Place 1 patch (7 mg total) onto the skin daily as needed (tobacco craving). 08/12/18  Clegg, Amy D, NP  omeprazole (PRILOSEC) 20 MG capsule Take 20 mg by mouth daily.    [provider]  oxyCODONE (OXY IR/ROXICODONE) 5 MG immediate release tablet Take 1 tablet (5 mg total) by mouth every 8 (eight) hours as needed for severe pain. 08/12/18   Clegg, Amy D, NP  polyethylene glycol (MIRALAX / GLYCOLAX) packet Take 17 g by mouth daily as needed for mild constipation.    [provider]  sildenafil (VIAGRA) 100 MG tablet Take 50 mg by mouth daily as needed for erectile dysfunction.    [provider]  spironolactone (ALDACTONE) 25 MG tablet Take 1 tablet (25 mg total) by mouth daily. 08/13/18   Clegg, Amy  D, NP  thiamine 100 MG tablet Take 100 mg by mouth daily.    [provider]  traMADol (ULTRAM) 50 MG tablet Take 1 tablet (50 mg total) by mouth every 4 (four) hours as needed for moderate pain. 08/12/18   Tonye Becket D, NP  warfarin (COUMADIN) 5 MG tablet Take 7.5 mg daily except 5 mg on Wednesday and Friday 08/12/18   Tonye Becket D, NP    ALLERGIES:  No Known Allergies  SOCIAL HISTORY:  Social History   Tobacco Use  . Smoking status: Current Every Day Smoker    Types: Cigarettes    Start date: 2003  . Smokeless tobacco: Never Used  Substance Use Topics  . Alcohol use: Not Currently    FAMILY HISTORY: No family history on file.  EXAM: Pulse (!) 106   Temp 98.6 F (37 C) (Oral)   Resp 18   SpO2 99%  CONSTITUTIONAL: Alert and oriented and responds appropriately to questions. Well-appearing; well-nourished HEAD: Normocephalic EYES: Conjunctivae clear, pupils appear equal, EOMI ENT: normal nose; moist mucous membranes, patient does have a large clot noted in the right nostril with slow drip of bright red blood.  No blood in the posterior oropharynx.  Left nostril is normal. NECK: Supple, no meningismus, no nuchal rigidity, no LAD  CARD: + murmur CHEST: Surgical incision site is healing well with no redness, warmth, drainage, bleeding RESP: Normal chest excursion without splinting or tachypnea; breath sounds clear and equal bilaterally; no wheezes, no rhonchi, no rales, no hypoxia or respiratory distress, speaking full sentences ABD/GI: Normal bowel sounds; non-distended; soft, non-tender, no rebound, no guarding, no peritoneal signs, no hepatosplenomegaly BACK:  The back appears normal and is non-tender to palpation, there is no CVA tenderness EXT: Normal ROM in all joints; non-tender to palpation; no edema; normal capillary refill; no cyanosis, no calf tenderness or swelling    SKIN: Normal color for age and race; warm; no rash NEURO: Moves all extremities  equally PSYCH: The patient's mood and manner are appropriate. Grooming and personal hygiene are appropriate.  MEDICAL DECISION MAKING: Patient here with epistaxis.  We have sprayed Afrin in the right nostril as this appears to be likely an anterior bleed and will have him hold pressure for 30 minutes.  No blood in the posterior oropharynx.  No hemorrhage.  LVAD coordinator has been contacted.  Labs pending.  ED PROGRESS: 1:30 AM  Pt's bleeding has stopped after Afrin and direct pressure.  Will continue to monitor.  2:15 AM  No other bleeding.  Labs show hemoglobin of 9.6 which is near his recent baseline.  Does have a white blood cell count of 17 but no infectious symptoms including fever, cough, vomiting, diarrhea, rash, dysuria, hematuria.  INR is therapeutic at 2.17.  Will  discuss with cardiology on-call given leukocytosis but anticipate discharge home with close outpatient follow-up.  2:35 AM  D/w Hessie Diener, LVAD coordinator.  She states they will follow-up with patient tomorrow and he has an appointment on Tuesday with the LVAD clinic.  Patient abnormal with plan for discharge home.  At this time, I do not feel there is any life-threatening condition present. I have reviewed and discussed all results (EKG, imaging, lab, urine as appropriate) and exam findings with patient/family. I have reviewed nursing notes and appropriate previous records.  I feel the patient is safe to be discharged home without further emergent workup and can continue workup as an outpatient as needed. Discussed usual and customary return precautions. Patient/family verbalize understanding and are comfortable with this plan.  Outpatient follow-up has been provided if needed. All questions have been answered.    Marland KitchenEpistaxis Management Date/Time: 08/15/2018 2:18 AM Performed by: Brison Fiumara, Layla Maw, DO Authorized by: Skye Rodarte, Layla Maw, DO   Consent:    Consent obtained:  Verbal   Consent given by:  Patient Anesthesia (see  MAR for exact dosages):    Anesthesia method:  None Procedure details:    Treatment site:  R anterior   Repair method: Afrin and direct pressure.   Treatment complexity:  Limited   Treatment episode: initial   Post-procedure details:    Assessment:  Bleeding stopped   Patient tolerance of procedure:  Tolerated well, no immediate complications        Tyjon Bowen, Layla Maw, DO 08/15/18 [id number]

## 2018-08-18 ENCOUNTER — Ambulatory Visit (HOSPITAL_COMMUNITY)
Admission: RE | Admit: 2018-08-18 | Discharge: 2018-08-18 | Disposition: A | Discharge status: Home / Self Care | Payer: Medicare Other | Source: Ambulatory Visit | Attending: Cardiology | Admitting: Cardiology

## 2018-08-18 ENCOUNTER — Encounter (HOSPITAL_COMMUNITY): Payer: Self-pay

## 2018-08-18 ENCOUNTER — Ambulatory Visit (HOSPITAL_COMMUNITY): Payer: Self-pay | Admitting: Pharmacist

## 2018-08-18 VITALS — BP 99/73 | HR 105 | Ht 70.0 in | Wt 136.0 lb

## 2018-08-18 DIAGNOSIS — Z7982 Long term (current) use of aspirin: Secondary | ICD-10-CM | POA: Diagnosis not present

## 2018-08-18 DIAGNOSIS — I420 Dilated cardiomyopathy: Secondary | ICD-10-CM | POA: Insufficient documentation

## 2018-08-18 DIAGNOSIS — Z95811 Presence of heart assist device: Secondary | ICD-10-CM

## 2018-08-18 DIAGNOSIS — W19XXXA Unspecified fall, initial encounter: Secondary | ICD-10-CM | POA: Diagnosis not present

## 2018-08-18 DIAGNOSIS — Z4509 Encounter for adjustment and management of other cardiac device: Secondary | ICD-10-CM | POA: Diagnosis not present

## 2018-08-18 DIAGNOSIS — Z79899 Other long term (current) drug therapy: Secondary | ICD-10-CM | POA: Insufficient documentation

## 2018-08-18 DIAGNOSIS — Z7901 Long term (current) use of anticoagulants: Secondary | ICD-10-CM | POA: Diagnosis not present

## 2018-08-18 DIAGNOSIS — E785 Hyperlipidemia, unspecified: Secondary | ICD-10-CM | POA: Insufficient documentation

## 2018-08-18 DIAGNOSIS — E119 Type 2 diabetes mellitus without complications: Secondary | ICD-10-CM | POA: Diagnosis not present

## 2018-08-18 DIAGNOSIS — D72829 Elevated white blood cell count, unspecified: Secondary | ICD-10-CM | POA: Diagnosis not present

## 2018-08-18 DIAGNOSIS — R55 Syncope and collapse: Secondary | ICD-10-CM | POA: Diagnosis not present

## 2018-08-18 DIAGNOSIS — Z86718 Personal history of other venous thrombosis and embolism: Secondary | ICD-10-CM | POA: Diagnosis not present

## 2018-08-18 DIAGNOSIS — I5022 Chronic systolic (congestive) heart failure: Secondary | ICD-10-CM

## 2018-08-18 DIAGNOSIS — Z7984 Long term (current) use of oral hypoglycemic drugs: Secondary | ICD-10-CM | POA: Insufficient documentation

## 2018-08-18 DIAGNOSIS — J90 Pleural effusion, not elsewhere classified: Secondary | ICD-10-CM | POA: Insufficient documentation

## 2018-08-18 DIAGNOSIS — S80211A Abrasion, right knee, initial encounter: Secondary | ICD-10-CM | POA: Insufficient documentation

## 2018-08-18 DIAGNOSIS — R040 Epistaxis: Secondary | ICD-10-CM | POA: Insufficient documentation

## 2018-08-18 DIAGNOSIS — I517 Cardiomegaly: Secondary | ICD-10-CM

## 2018-08-18 DIAGNOSIS — G4733 Obstructive sleep apnea (adult) (pediatric): Secondary | ICD-10-CM | POA: Insufficient documentation

## 2018-08-18 DIAGNOSIS — K219 Gastro-esophageal reflux disease without esophagitis: Secondary | ICD-10-CM | POA: Insufficient documentation

## 2018-08-18 DIAGNOSIS — I428 Other cardiomyopathies: Secondary | ICD-10-CM | POA: Diagnosis not present

## 2018-08-18 DIAGNOSIS — K7030 Alcoholic cirrhosis of liver without ascites: Secondary | ICD-10-CM | POA: Insufficient documentation

## 2018-08-18 DIAGNOSIS — F17200 Nicotine dependence, unspecified, uncomplicated: Secondary | ICD-10-CM | POA: Diagnosis not present

## 2018-08-18 LAB — CBC
HCT: 31.3 % — ABNORMAL LOW (ref 39.0–52.0)
Hemoglobin: 9.7 g/dL — ABNORMAL LOW (ref 13.0–17.0)
MCH: 27.6 pg (ref 26.0–34.0)
MCHC: 31 g/dL (ref 30.0–36.0)
MCV: 89.2 fL (ref 78.0–100.0)
PLATELETS: 495 10*3/uL — AB (ref 150–400)
RBC: 3.51 MIL/uL — AB (ref 4.22–5.81)
RDW: 14.6 % (ref 11.5–15.5)
WBC: 15.7 10*3/uL — ABNORMAL HIGH (ref 4.0–10.5)

## 2018-08-18 LAB — BASIC METABOLIC PANEL
Anion gap: 11 (ref 5–15)
BUN: 18 mg/dL (ref 6–20)
CO2: 27 mmol/L (ref 22–32)
CREATININE: 0.78 mg/dL (ref 0.61–1.24)
Calcium: 10.1 mg/dL (ref 8.9–10.3)
Chloride: 99 mmol/L (ref 98–111)
GFR calc Af Amer: >60 mL/min (ref >60)
GLUCOSE: 108 mg/dL — AB (ref 70–99)
POTASSIUM: 4.3 mmol/L (ref 3.5–5.1)
SODIUM: 137 mmol/L (ref 135–145)

## 2018-08-18 LAB — PROTIME-INR
INR: 1.95
Prothrombin Time: 22.1 seconds — ABNORMAL HIGH (ref 11.4–15.2)

## 2018-08-18 LAB — LACTATE DEHYDROGENASE: LDH: 185 U/L (ref 98–192)

## 2018-08-18 MED ORDER — SPIRONOLACTONE 25 MG PO TABS: 12.5000 mg | ORAL_TABLET | Freq: Every day | ORAL | 6 refills | Status: DC

## 2018-08-18 MED ORDER — FUROSEMIDE 40 MG PO TABS: 20.0000 mg | ORAL_TABLET | Freq: Every day | ORAL | 6 refills | Status: DC

## 2018-08-18 NOTE — Progress Notes (Addendum)
Patient presents for hospital  follow up in VAD Clinic today following his VAD implant on 08/03/18. Reports no problems with VAD equipment or concerns with drive line.  Pt did have an episode of his nose bleeding Saturday pts caregiver [name] states that she did call the VAD pager but no one called her back. I had her practice doing this in clinic today.  Pt also states that he has "blacked out" 4x since discharge from the hospital. He states that he wasn't injured either time. Pt has a small abrasion on his right knee from one of the falls.   Vital Signs:  Doppler Pressure: 80   Automatc BP: 99/73 (84) HR: 105  SPO2: UTA  %  Weight: 136 lb w/o eqt Last weight: 144 lb Home weights:  lbs   VAD Indication: Destination Therapy    VAD interrogation & Equipment Management: Speed: 5700 Flow: 4.7 Power: 4.4 w    PI: 3.3  Alarms: no clinical alarms Events: 20-25 daily  Fixed speed: 5700 Low speed limit: 5400  Primary Controller:  Replace back up battery in 30 months. Back up controller:   Replace back up battery in 30 months.  Annual Equipment Maintenance on UBC/PM was performed on 07/2018.   I reviewed the LVAD parameters from today and compared the results to the patient's prior recorded data. LVAD interrogation was NEGATIVE for significant power changes, NEGATIVE for clinical alarms and STABLE for PI events/speed drops. No programming changes were made and pump is functioning within specified parameters. Pt is performing daily controller and system monitor self tests along with completing weekly and monthly maintenance for LVAD equipment.  LVAD equipment check completed and is in good working order. Back-up equipment present. Charged back up battery and performed self-test on equipment.   Exit Site Care: Drive line is being maintained daily by [name].  Existing VAD dressing removed and site care performed using sterile technique. Drive line exit site cleaned with Chlora  prep applicators x 2, allowed to dry, and gauze dressing with silver strip re-applied. Exit site healing and unincorporated, the velour is fully implanted at exit site. Single stitch removed today. No redness, tenderness, drainage, foul odor or rash noted. Drive line anchor re-applied. Pt denies fever or chills. May advance to twice weekly dressing changes.    BP & Labs:  MAP 80 - Doppler is reflecting MAP  Hgb 9.7 - No S/S of bleeding. Specifically denies melena/BRBPR or nosebleeds.  LDH stable at 185 with established baseline of 175 - 255. Denies tea-colored urine. No power elevations noted on interrogation.   2 view chest xray obtained today:  1. Resolution of previously noted pulmonary vascular congestion. 2. Stable cardiomegaly with small left effusion and LVAD.   Pt is requesting that his note from todays visit as well as labs and medication list be faxed over to the [place]. His doctor there is Dr. [name] fax number [phone].   Plan: 1. Stop Lasix for 4 days resume on Sunday at a dose of 20 mg once daily. 2. Stop Digoxin. 3. Decrease Spirolactone to 12.5 mg daily. 4. Refer to Cardiac Rehab.  5. Return to clinic in 1 week.    [name] RN VAD Coordinator  Office: [phone] 24/7 Emergency VAD Pager: [phone]    OZH:[id number], Lucita Lora, MD  Cardiology: Dr. Shirlee Latch  57 yo with history of nonischemic cardiomyopathy, RLE DVT, cirrhosis, smoking, and OSA returns for followup of CHF/LVAD placement.  Cardiomyopathy was diagnosed in 6/19 in [place], [place] at  that time showed low output.  He was admitted to [facility] in 9/19 with low output HF and was started on milrinone and diuresed.  Unable to wean off milrinone.  He had a degree of RV failure, but this improved on milrinone.  Valvular heart disease also looked better with milrinone and diuresis.  On 08/03/18, he had Heartmate 3 LVAD placed.  Speed was optimized by ramp echo post-op.  Post-op course was relatively  unremarkable.    Since going home, patient states that his breathing has been doing quite well.  No dyspnea walking on flat ground.  No BRBPR/melena.  Minimal surgical site pain.  He is still smoking a little and is going to get nicotine patches from the [place].  Mild lightheadedness with standing since coming home.  He actually has had 4 episodes where he has fallen, he thinks he passes out briefly with these episodes.  Each time, this has happened after standing up abruptly. He went to the ER with epistaxis last weekend, this resolved.  WBCs elevated at that time and he was started empirically on doxycycline.  He has had stable 20-25 PI events/day with no suction or low flow.  Good appetite.   Labs (9/19): LDH 210, INR 2.17, WBCs 17.1 => 15, hgb 9.7, creatinine 0.72  Denies LVAD alarms.  Denies driveline trauma, erythema or drainage.  Denies ICD shocks.   Reports taking Coumadin as prescribed and adherence to anticoagulation based dietary restrictions.  Denies bright red blood per rectum or melena, no dark urine or hematuria.    PMH: 1. Degenerative disc disease.  2. GERD 3. Chronic systolic CHF:  Nonischemic cardiomyopathy.  Dilated cardiomyopathy diagnosed 6/19 in [place].  LHC/RHC in 7/19 showed elevated filling pressures, low cardiac output, and no significant CAD.   - Echo (9/19): Severe LV dilation with EF 10-20%, moderate-severe MR, severely dilated RV with mildly decreased systolic function, severe TR.  - Cardiac MRI (9/19): EF 14%, moderate dilated LV, severely dilated RV with mod-severe systolic function and EF 25%, nonspecific RV insertion site LGE.  - RHC (5/19) on milrinone 0.375: mean RA 8, PA 40/19, mean PCWP 12, PAPi 2.65, CI 2.71.  - Echo (9/19, on milrinone and diuresed): EF 20-25% with moderate LV dilation, moderately dilated RV with mildly decreased systolic function, mild-moderate MR, moderate TR, PASP 51 mmHg.  Cannot rule out noncompaction.  - Heartmate 3 LVAD placement in 9/19.   4. OSA: CPAP use.  5. Active smoker 6. Type 2 diabetes 7. Hyperlipidemia.  8. H/o NSVT 9. 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  10. RLE DVT: found in 9/19.   Current Outpatient Medications  Medication Sig Dispense Refill  . aspirin EC 81 MG tablet Take 81 mg by mouth daily.    Marland Kitchen atorvastatin (LIPITOR) 20 MG tablet Take 20 mg by mouth daily.     . carbamide peroxide (DEBROX) 6.5 % otic solution Place 5 drops into both ears 3 (three) times daily. 5-10 drops    . clotrimazole (LOTRIMIN) 1 % external solution Apply 1 application topically daily.    . cyanocobalamin 500 MCG tablet Take 500 mcg by mouth daily.    Marland Kitchen doxycycline (VIBRAMYCIN) 50 MG capsule Take 2 capsules (100 mg total) by mouth 2 (two) times daily for 7 days. 28 capsule 0  . DULoxetine (CYMBALTA) 60 MG capsule Take 60 mg by mouth daily.    . fluticasone (FLONASE) 50 MCG/ACT nasal spray Place 1 spray into both nostrils 2 (two) times daily  as needed for allergies.     . furosemide (LASIX) 40 MG tablet Take 0.5 tablets (20 mg total) by mouth daily. 60 tablet 6  . gabapentin (NEURONTIN) 300 MG capsule Take 600 mg by mouth 3 (three) times daily.    . hydrOXYzine (VISTARIL) 25 MG capsule Take 25 mg by mouth 3 (three) times daily as needed for anxiety.    . Ipratropium-Albuterol (COMBIVENT) 20-100 MCG/ACT AERS respimat Inhale 1 puff into the lungs every 6 (six) hours.    Marland Kitchen losartan (COZAAR) 25 MG tablet Take 1 tablet (25 mg total) by mouth daily. 30 tablet 6  . metFORMIN (GLUCOPHAGE-XR) 500 MG 24 hr tablet Take 500 mg by mouth 2 (two) times daily.    . methocarbamol (ROBAXIN) 750 MG tablet Take 750 mg by mouth 3 (three) times daily as needed for muscle spasms.     Marland Kitchen omeprazole (PRILOSEC) 20 MG capsule Take 20 mg by mouth daily.    Marland Kitchen oxyCODONE (OXY IR/ROXICODONE) 5 MG immediate release tablet Take 1 tablet (5 mg total) by mouth every 8 (eight) hours as needed for severe pain. 30 tablet 0  . sildenafil  (VIAGRA) 100 MG tablet Take 50 mg by mouth daily as needed for erectile dysfunction.    Marland Kitchen spironolactone (ALDACTONE) 25 MG tablet Take 0.5 tablets (12.5 mg total) by mouth daily. 30 tablet 6  . thiamine 100 MG tablet Take 100 mg by mouth daily.    . traMADol (ULTRAM) 50 MG tablet Take 1 tablet (50 mg total) by mouth every 4 (four) hours as needed for moderate pain. 30 tablet 0  . warfarin (COUMADIN) 5 MG tablet Take 7.5 mg daily except 5 mg on Wednesday and Friday 45 tablet 6  . docusate sodium (COLACE) 100 MG capsule Take 2 capsules (200 mg total) by mouth daily as needed for mild constipation. (Patient not taking: Reported on 08/18/2018) 10 capsule 0  . nicotine (NICODERM CQ - DOSED IN MG/24 HR) 7 mg/24hr patch Place 1 patch (7 mg total) onto the skin daily as needed (tobacco craving). (Patient not taking: Reported on 08/18/2018) 28 patch 0  . polyethylene glycol (MIRALAX / GLYCOLAX) packet Take 17 g by mouth daily as needed for mild constipation.     No current facility-administered medications for this encounter.     Patient has no known allergies.  REVIEW OF SYSTEMS: All systems negative except as listed in HPI, PMH and Problem list.   LVAD INTERROGATION:  Please see LVAD nurse's note above for details.    I reviewed the LVAD parameters from today, and compared the results to the patient's prior recorded data.  No programming changes were made.  The LVAD is functioning within specified parameters.  The patient performs LVAD self-test daily.  LVAD interrogation was negative for any significant power changes, alarms or PI events/speed drops.  LVAD equipment check completed and is in good working order.  Back-up equipment present.   LVAD education done on emergency procedures and precautions and reviewed exit site care.    Vitals:   08/18/18 1025 08/18/18 1027  BP: (!) 80/0 99/73  Pulse: 99 (!) 105  Weight: 61.7 kg (136 lb)   Height: 5\' 10"  (1.778 m)     Physical Exam: GENERAL: Well  appearing, male who presents to clinic today in no acute distress. HEENT: normal  NECK: Supple, JVP not elevated.  2+ bilaterally, no bruits.  No lymphadenopathy or thyromegaly appreciated.   CARDIAC:  Mechanical heart sounds with LVAD hum present.  LUNGS:  Clear to auscultation bilaterally.  ABDOMEN:  Soft, round, nontender, positive bowel sounds x4.     LVAD exit site: well-healed and incorporated.  Dressing dry and intact.  No erythema or drainage.  Stabilization device present and accurately applied.  Driveline dressing is being changed daily per sterile technique. EXTREMITIES:  Warm and dry, no cyanosis, clubbing, rash or edema  NEUROLOGIC:  Alert and oriented x 4.  Gait steady.  No aphasia.  No dysarthria.  Affect pleasant.     EKG: NSR, LVH (9/19, personally reviewed).   ASSESSMENT AND PLAN:   1. 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  LVAD parameters stable, no suction or low flow.  Orthostatic symptoms, near-syncope x 4 with standing.  He is on Lasix 40 mg bid.   - Hold Lasix x 4 days, then decrease to 20 mg daily.   - Decrease spironolactone to 12.5 mg daily.  - He can stop digoxin.  - Cardiac rehab in future.  - Continue ASA 81 daily.  - Continue warfarin for INR 2-2.5.  No evidence for GI bleeding.  CBC today.  - Should be transplant candidate eventually if he can stop smoking.  2. Smoking: Needs to quit completely.  He will be getting nicotine patches from the [place].  3. ID: Elevated WBCs in ER this weekend, given doxycycline 100 mg bid x 7 days.  No fever, WBCs down to 15 today.  4. RLE DVT: On warfarin for LVAD.  5. OSA: Continue CPAP.  6. Hyperlipidemia: Atorvastatin.  7. Type II diabetes: Metformin, per PCP.   Marca Ancona 08/18/2018 4:25 PM

## 2018-08-18 NOTE — Patient Instructions (Addendum)
1. Stop Lasix for 4 days restart on Sunday at 20 mg once daily. 2. Stop Digoxin. 3. Decrease Spirolactone to 12.5mg  daily. 4. Refer to Cardiac Rehab.  5. Return to clinic in 1 week.

## 2018-08-24 ENCOUNTER — Other Ambulatory Visit (HOSPITAL_COMMUNITY): Payer: Self-pay | Admitting: *Deleted

## 2018-08-24 DIAGNOSIS — Z7901 Long term (current) use of anticoagulants: Secondary | ICD-10-CM

## 2018-08-24 DIAGNOSIS — Z95811 Presence of heart assist device: Secondary | ICD-10-CM

## 2018-08-26 ENCOUNTER — Ambulatory Visit (HOSPITAL_COMMUNITY)
Admission: RE | Admit: 2018-08-26 | Discharge: 2018-08-26 | Disposition: A | Discharge status: Home / Self Care | Payer: No Typology Code available for payment source | Source: Ambulatory Visit | Attending: Internal Medicine | Admitting: Internal Medicine

## 2018-08-26 ENCOUNTER — Encounter (HOSPITAL_COMMUNITY): Payer: Self-pay

## 2018-08-26 ENCOUNTER — Ambulatory Visit (HOSPITAL_COMMUNITY): Payer: Self-pay | Admitting: Pharmacist

## 2018-08-26 VITALS — BP 115/84 | HR 92 | Wt 146.8 lb

## 2018-08-26 DIAGNOSIS — Z86718 Personal history of other venous thrombosis and embolism: Secondary | ICD-10-CM | POA: Insufficient documentation

## 2018-08-26 DIAGNOSIS — I5022 Chronic systolic (congestive) heart failure: Secondary | ICD-10-CM | POA: Insufficient documentation

## 2018-08-26 DIAGNOSIS — Z4509 Encounter for adjustment and management of other cardiac device: Secondary | ICD-10-CM | POA: Insufficient documentation

## 2018-08-26 DIAGNOSIS — Z95811 Presence of heart assist device: Secondary | ICD-10-CM | POA: Diagnosis present

## 2018-08-26 DIAGNOSIS — Z7901 Long term (current) use of anticoagulants: Secondary | ICD-10-CM | POA: Diagnosis not present

## 2018-08-26 DIAGNOSIS — I428 Other cardiomyopathies: Secondary | ICD-10-CM | POA: Diagnosis not present

## 2018-08-26 DIAGNOSIS — Z7982 Long term (current) use of aspirin: Secondary | ICD-10-CM | POA: Insufficient documentation

## 2018-08-26 DIAGNOSIS — E785 Hyperlipidemia, unspecified: Secondary | ICD-10-CM | POA: Diagnosis not present

## 2018-08-26 DIAGNOSIS — Z7984 Long term (current) use of oral hypoglycemic drugs: Secondary | ICD-10-CM | POA: Insufficient documentation

## 2018-08-26 DIAGNOSIS — Z79899 Other long term (current) drug therapy: Secondary | ICD-10-CM | POA: Diagnosis not present

## 2018-08-26 DIAGNOSIS — Z87891 Personal history of nicotine dependence: Secondary | ICD-10-CM | POA: Diagnosis not present

## 2018-08-26 DIAGNOSIS — E119 Type 2 diabetes mellitus without complications: Secondary | ICD-10-CM | POA: Insufficient documentation

## 2018-08-26 DIAGNOSIS — G4733 Obstructive sleep apnea (adult) (pediatric): Secondary | ICD-10-CM | POA: Insufficient documentation

## 2018-08-26 DIAGNOSIS — K219 Gastro-esophageal reflux disease without esophagitis: Secondary | ICD-10-CM | POA: Diagnosis not present

## 2018-08-26 LAB — BASIC METABOLIC PANEL
ANION GAP: 8 (ref 5–15)
BUN: 6 mg/dL (ref 6–20)
CALCIUM: 9.6 mg/dL (ref 8.9–10.3)
CO2: 27 mmol/L (ref 22–32)
CREATININE: 0.62 mg/dL (ref 0.61–1.24)
Chloride: 107 mmol/L (ref 98–111)
GFR calc Af Amer: >60 mL/min (ref >60)
GLUCOSE: 94 mg/dL (ref 70–99)
Potassium: 4.1 mmol/L (ref 3.5–5.1)
Sodium: 142 mmol/L (ref 135–145)

## 2018-08-26 LAB — PROTIME-INR
INR: 3.45
Prothrombin Time: 34.5 seconds — ABNORMAL HIGH (ref 11.4–15.2)

## 2018-08-26 LAB — CBC
HEMATOCRIT: 32.9 % — AB (ref 39.0–52.0)
Hemoglobin: 9.6 g/dL — ABNORMAL LOW (ref 13.0–17.0)
MCH: 26.4 pg (ref 26.0–34.0)
MCHC: 29.2 g/dL — AB (ref 30.0–36.0)
MCV: 90.4 fL (ref 80.0–100.0)
NRBC: 0 % (ref 0.0–0.2)
PLATELETS: 460 10*3/uL — AB (ref 150–400)
RBC: 3.64 MIL/uL — ABNORMAL LOW (ref 4.22–5.81)
RDW: 14.9 % (ref 11.5–15.5)
WBC: 9.7 10*3/uL (ref 4.0–10.5)

## 2018-08-26 LAB — LACTATE DEHYDROGENASE: LDH: 195 U/L — AB (ref 98–192)

## 2018-08-26 MED ORDER — LOSARTAN POTASSIUM 25 MG PO TABS: 25.0000 mg | ORAL_TABLET | Freq: Two times a day (BID) | ORAL | 6 refills | Status: DC

## 2018-08-26 NOTE — Patient Instructions (Signed)
1. Increase Losartan 25mg  to twice a day.  2. Support group Oct 21st at 5pm in the Heart and Vascular conference room.

## 2018-08-26 NOTE — Progress Notes (Addendum)
Patient presents for 1 week follow up in VAD Clinic today with significant other [name]. Reports no problems with VAD equipment or concerns with drive line.  Pt also states that he had 1 fall when he was transferring from the couch to the recliner. He states that he stood up too fast and felt dizzy/lightheaded, but did not lose consciousness. He states that he wasn't injured when he fell.   Pt states that he has quit smoking over the weekend. He is wearing a Nicoderm patch currently.   Vital Signs:  Doppler Pressure: 90   Automatc BP: 115/84 (99) HR: 92  SPO2: UTA  %  Weight: 146.58 lb w/o eqt Last weight: 136 lb Home weights:137-144 lbs   VAD Indication: Destination Therapy    VAD interrogation & Equipment Management: Speed: 5700 Flow: 4.6 Power: 4.5 w    PI: 3.3  Alarms: no clinical alarms Events: 5-15 daily  Fixed speed: 5700 Low speed limit: 5400  Primary Controller:  Replace back up battery in 30 months. Back up controller:   Replace back up battery in 30 months.  Annual Equipment Maintenance on UBC/PM was performed on 07/2018.   I reviewed the LVAD parameters from today and compared the results to the patient's prior recorded data. LVAD interrogation was NEGATIVE for significant power changes, NEGATIVE for clinical alarms and STABLE for PI events/speed drops. No programming changes were made and pump is functioning within specified parameters. Pt is performing daily controller and system monitor self tests along with completing weekly and monthly maintenance for LVAD equipment.  LVAD equipment check completed and is in good working order. Back-up equipment present.   Exit Site Care: Drive line is being maintained daily by [name].  Existing VAD dressing removed and site care performed using sterile technique. Drive line exit site cleaned with Chlora prep applicators x 2, allowed to dry, and gauze dressing with silver strip re-applied. Exit site healing and  unincorporated, the velour is fully implanted at exit site. No redness, tenderness, drainage, foul odor or rash noted. Drive line anchor re-applied. Pt denies fever or chills. May advance to twice weekly dressing changes.    BP & Labs:  MAP 90 - Doppler is reflecting MAP  Hgb 9.6 - No S/S of bleeding. Specifically denies melena/BRBPR or nosebleeds.  LDH stable at 195 with established baseline of 175 - 255. Denies tea-colored urine. No power elevations noted on interrogation.    Plan: 1. Increase Losartan to 25mg  BID  2. Return to clinic in 1 week.    Alyce Pagan RN VAD Coordinator  Office: [phone]  24/7 Pager: [phone]   GNF:[id number], Lucita Lora, MD  Cardiology: Dr. Shirlee Latch  57 yo with history of nonischemic cardiomyopathy, RLE DVT, cirrhosis, smoking, and OSA returns for followup of CHF/LVAD placement.  Cardiomyopathy was diagnosed in 6/19 in [place], [place] at that time showed low output.  He was admitted to [facility] in 9/19 with low output HF and was started on milrinone and diuresed.  Unable to wean off milrinone.  He had a degree of RV failure, but this improved on milrinone.  Valvular heart disease also looked better with milrinone and diuresis.  On 08/03/18, he had Heartmate 3 LVAD placed.  Speed was optimized by ramp echo post-op.  Post-op course was relatively unremarkable.    At last appointment, we thought he was dehydrated and cut back Lasix considerably. He is now taking 20 mg daily Lasix.  He had one "dizzy spell" Friday of last week but did not  pass out. LVAD parameters improved, significantly fewer PI events.  No dyspnea walking on flat ground.  Able to do all ADLs.  He is not smoking.  Weight is up 4 lbs and he is eating better.  MAP higher in 90s.    Labs (9/19): LDH 210, INR 2.17, WBCs 17.1 => 15, hgb 9.7, creatinine 0.72 Labs (10/19): K 4.3, creatinine 0.78  Denies LVAD alarms.  Denies driveline trauma, erythema or drainage.  Denies ICD shocks.   Reports  taking Coumadin as prescribed and adherence to anticoagulation based dietary restrictions.  Denies bright red blood per rectum or melena, no dark urine or hematuria.    PMH: 1. Degenerative disc disease.  2. GERD 3. Chronic systolic CHF:  Nonischemic cardiomyopathy.  Dilated cardiomyopathy diagnosed 6/19 in [place].  LHC/RHC in 7/19 showed elevated filling pressures, low cardiac output, and no significant CAD.   - Echo (9/19): Severe LV dilation with EF 10-20%, moderate-severe MR, severely dilated RV with mildly decreased systolic function, severe TR.  - Cardiac MRI (9/19): EF 14%, moderate dilated LV, severely dilated RV with mod-severe systolic function and EF 25%, nonspecific RV insertion site LGE.  - RHC (5/19) on milrinone 0.375: mean RA 8, PA 40/19, mean PCWP 12, PAPi 2.65, CI 2.71.  - Echo (9/19, on milrinone and diuresed): EF 20-25% with moderate LV dilation, moderately dilated RV with mildly decreased systolic function, mild-moderate MR, moderate TR, PASP 51 mmHg.  Cannot rule out noncompaction.  - Heartmate 3 LVAD placement in 9/19.  4. OSA: CPAP use.  5. Active smoker 6. Type 2 diabetes 7. Hyperlipidemia.  8. H/o NSVT 9. 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  10. RLE DVT: found in 9/19.   Current Outpatient Medications  Medication Sig Dispense Refill  . aspirin EC 81 MG tablet Take 81 mg by mouth daily.    Marland Kitchen atorvastatin (LIPITOR) 20 MG tablet Take 20 mg by mouth daily.     . carbamide peroxide (DEBROX) 6.5 % otic solution Place 5 drops into both ears 3 (three) times daily. 5-10 drops    . clotrimazole (LOTRIMIN) 1 % external solution Apply 1 application topically daily.    . cyanocobalamin 500 MCG tablet Take 500 mcg by mouth daily.    Marland Kitchen docusate sodium (COLACE) 100 MG capsule Take 2 capsules (200 mg total) by mouth daily as needed for mild constipation. 10 capsule 0  . DULoxetine (CYMBALTA) 60 MG capsule Take 60 mg by mouth daily.    . fluticasone  (FLONASE) 50 MCG/ACT nasal spray Place 1 spray into both nostrils 2 (two) times daily as needed for allergies.     . furosemide (LASIX) 40 MG tablet Take 0.5 tablets (20 mg total) by mouth daily. 60 tablet 6  . gabapentin (NEURONTIN) 300 MG capsule Take 600 mg by mouth 3 (three) times daily.    . hydrOXYzine (VISTARIL) 25 MG capsule Take 25 mg by mouth 3 (three) times daily as needed for anxiety.    . Ipratropium-Albuterol (COMBIVENT) 20-100 MCG/ACT AERS respimat Inhale 1 puff into the lungs every 6 (six) hours.    Marland Kitchen losartan (COZAAR) 25 MG tablet Take 1 tablet (25 mg total) by mouth 2 (two) times daily. 30 tablet 6  . metFORMIN (GLUCOPHAGE-XR) 500 MG 24 hr tablet Take 500 mg by mouth 2 (two) times daily.    . methocarbamol (ROBAXIN) 750 MG tablet Take 750 mg by mouth 3 (three) times daily as needed for muscle spasms.     Marland Kitchen  nicotine (NICODERM CQ - DOSED IN MG/24 HR) 7 mg/24hr patch Place 1 patch (7 mg total) onto the skin daily as needed (tobacco craving). 28 patch 0  . omeprazole (PRILOSEC) 20 MG capsule Take 20 mg by mouth daily.    Marland Kitchen oxyCODONE (OXY IR/ROXICODONE) 5 MG immediate release tablet Take 1 tablet (5 mg total) by mouth every 8 (eight) hours as needed for severe pain. 30 tablet 0  . sildenafil (VIAGRA) 100 MG tablet Take 50 mg by mouth daily as needed for erectile dysfunction.    Marland Kitchen spironolactone (ALDACTONE) 25 MG tablet Take 0.5 tablets (12.5 mg total) by mouth daily. 30 tablet 6  . thiamine 100 MG tablet Take 100 mg by mouth daily.    . traMADol (ULTRAM) 50 MG tablet Take 1 tablet (50 mg total) by mouth every 4 (four) hours as needed for moderate pain. 30 tablet 0  . warfarin (COUMADIN) 5 MG tablet Take 7.5 mg daily except 5 mg on Wednesday and Friday 45 tablet 6  . polyethylene glycol (MIRALAX / GLYCOLAX) packet Take 17 g by mouth daily as needed for mild constipation.     No current facility-administered medications for this encounter.     Patient has no known allergies.  REVIEW  OF SYSTEMS: All systems negative except as listed in HPI, PMH and Problem list.   LVAD INTERROGATION:  Please see LVAD nurse's note above for details.    I reviewed the LVAD parameters from today, and compared the results to the patient's prior recorded data.  No programming changes were made.  The LVAD is functioning within specified parameters.  The patient performs LVAD self-test daily.  LVAD interrogation was negative for any significant power changes, alarms or PI events/speed drops.  LVAD equipment check completed and is in good working order.  Back-up equipment present.   LVAD education done on emergency procedures and precautions and reviewed exit site care.    Vitals:   08/26/18 0930 08/26/18 0931  BP: (!) 90/0 115/84  Pulse: 92   Weight: 66.6 kg (146 lb 12.8 oz)     Physical Exam: General: Well appearing this am. NAD.  HEENT: Normal. Neck: Supple, JVP 7-8 cm. Carotids OK.  Cardiac:  Mechanical heart sounds with LVAD hum present.  Lungs:  CTAB, normal effort.  Abdomen:  NT, ND, no HSM. No bruits or masses. +BS  LVAD exit site: Well-healed and incorporated. Dressing dry and intact. No erythema or drainage. Stabilization device present and accurately applied. Driveline dressing changed daily per sterile technique. Extremities:  Warm and dry. No cyanosis, clubbing, rash, or edema.  Neuro:  Alert & oriented x 3. Cranial nerves grossly intact. Moves all 4 extremities w/o difficulty. Affect pleasant      ASSESSMENT AND PLAN:   1. 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  LVAD parameters stable, no suction or low flow.  Orthostasis mostly resolved on lower dose of Lasix, fewer PI events.     - Continue Lasix at 20 mg daily.   - Continue spironolactone 12.5 mg daily.  - With elevated MAP, increase losartan to 25 mg bid.   - Cardiac rehab in future.  - Continue ASA 81 daily.  - Continue warfarin for INR 2-2.5.  No evidence for GI  bleeding.  CBC today.  - Should be transplant candidate eventually if he can stay off cigarettes.   2. Smoking: He has quit, using nicotine patches.  3. RLE DVT: On warfarin for LVAD.  4. OSA: Continue CPAP.  5. Hyperlipidemia: Atorvastatin.  6. Type II diabetes: Metformin, per PCP.   Marca Ancona 08/27/2018 3:05 PM

## 2018-08-26 NOTE — Progress Notes (Signed)
error 

## 2018-08-26 NOTE — Progress Notes (Addendum)
CSW met with patient in the clinic for follow up visit. Patient reports he is feeling well and adjusting at home to VAD life. Patient's primary caregiver [name] asked questions about contacting [employer] to make patient on the priority list. CSW assured her that when discharged from the hospital the [facility] send a letter to the [place]. Patient and caregiver verbalizes understanding and grateful for assistance. CSW encouraged patient and caregiver to attend the LVAD Support Group this month for added peer support. They appeared interested. CSW continues to follow for supportive intervention. Raquel Sarna, [place], [place]

## 2018-08-27 ENCOUNTER — Telehealth (HOSPITAL_COMMUNITY): Payer: Self-pay | Admitting: *Deleted

## 2018-08-27 NOTE — Telephone Encounter (Signed)
Clinical review of pt follow up appt on 08/26/18 cardiologist office note with Dr. Shirlee Latch and VAD Coordinator, Revonda Standard. Pt is making the expected progress in recovery.  Pt appropriate for scheduling for cardiac rehab.  Will forward to support staff for scheduling with pt consent.  Nikki Dom

## 2018-08-28 ENCOUNTER — Telehealth (HOSPITAL_COMMUNITY): Payer: Self-pay

## 2018-08-28 NOTE — Telephone Encounter (Signed)
Attempted to contact patient in regards to Cardiac Rehab - lm on vm °

## 2018-09-01 ENCOUNTER — Other Ambulatory Visit (HOSPITAL_COMMUNITY): Payer: Self-pay | Admitting: Unknown Physician Specialty

## 2018-09-01 DIAGNOSIS — Z7901 Long term (current) use of anticoagulants: Secondary | ICD-10-CM

## 2018-09-01 DIAGNOSIS — Z95811 Presence of heart assist device: Secondary | ICD-10-CM

## 2018-09-02 ENCOUNTER — Other Ambulatory Visit (HOSPITAL_COMMUNITY): Payer: Self-pay | Admitting: *Deleted

## 2018-09-02 ENCOUNTER — Ambulatory Visit (HOSPITAL_COMMUNITY): Payer: Self-pay | Admitting: Pharmacist

## 2018-09-02 ENCOUNTER — Ambulatory Visit (HOSPITAL_COMMUNITY)
Admission: RE | Admit: 2018-09-02 | Discharge: 2018-09-02 | Disposition: A | Discharge status: Home / Self Care | Payer: No Typology Code available for payment source | Source: Ambulatory Visit | Attending: Internal Medicine | Admitting: Internal Medicine

## 2018-09-02 ENCOUNTER — Encounter (HOSPITAL_COMMUNITY): Payer: Self-pay

## 2018-09-02 ENCOUNTER — Telehealth (HOSPITAL_COMMUNITY): Payer: Self-pay | Admitting: *Deleted

## 2018-09-02 VITALS — BP 114/87 | HR 95 | Wt 159.2 lb

## 2018-09-02 DIAGNOSIS — G4733 Obstructive sleep apnea (adult) (pediatric): Secondary | ICD-10-CM

## 2018-09-02 DIAGNOSIS — Z48812 Encounter for surgical aftercare following surgery on the circulatory system: Secondary | ICD-10-CM | POA: Diagnosis not present

## 2018-09-02 DIAGNOSIS — I5022 Chronic systolic (congestive) heart failure: Secondary | ICD-10-CM

## 2018-09-02 DIAGNOSIS — Z0120 Encounter for dental examination and cleaning without abnormal findings: Secondary | ICD-10-CM

## 2018-09-02 DIAGNOSIS — Z7901 Long term (current) use of anticoagulants: Secondary | ICD-10-CM | POA: Diagnosis present

## 2018-09-02 DIAGNOSIS — F17210 Nicotine dependence, cigarettes, uncomplicated: Secondary | ICD-10-CM | POA: Diagnosis not present

## 2018-09-02 DIAGNOSIS — Z95811 Presence of heart assist device: Secondary | ICD-10-CM

## 2018-09-02 DIAGNOSIS — Z720 Tobacco use: Secondary | ICD-10-CM | POA: Diagnosis not present

## 2018-09-02 DIAGNOSIS — E7800 Pure hypercholesterolemia, unspecified: Secondary | ICD-10-CM

## 2018-09-02 DIAGNOSIS — I159 Secondary hypertension, unspecified: Secondary | ICD-10-CM

## 2018-09-02 LAB — CBC
HCT: 33.3 % — ABNORMAL LOW (ref 39.0–52.0)
HEMOGLOBIN: 9.6 g/dL — AB (ref 13.0–17.0)
MCH: 26 pg (ref 26.0–34.0)
MCHC: 28.8 g/dL — ABNORMAL LOW (ref 30.0–36.0)
MCV: 90.2 fL (ref 80.0–100.0)
NRBC: 0 % (ref 0.0–0.2)
Platelets: 368 10*3/uL (ref 150–400)
RBC: 3.69 MIL/uL — ABNORMAL LOW (ref 4.22–5.81)
RDW: 15.2 % (ref 11.5–15.5)
WBC: 8.9 10*3/uL (ref 4.0–10.5)

## 2018-09-02 LAB — BASIC METABOLIC PANEL
ANION GAP: 7 (ref 5–15)
BUN: 8 mg/dL (ref 6–20)
CHLORIDE: 108 mmol/L (ref 98–111)
CO2: 28 mmol/L (ref 22–32)
Calcium: 9.2 mg/dL (ref 8.9–10.3)
Creatinine, Ser: 0.83 mg/dL (ref 0.61–1.24)
GFR calc non Af Amer: >60 mL/min (ref >60)
Glucose, Bld: 105 mg/dL — ABNORMAL HIGH (ref 70–99)
Potassium: 4.1 mmol/L (ref 3.5–5.1)
Sodium: 143 mmol/L (ref 135–145)

## 2018-09-02 LAB — PROTIME-INR
INR: 3.72
Prothrombin Time: 36.3 seconds — ABNORMAL HIGH (ref 11.4–15.2)

## 2018-09-02 LAB — LACTATE DEHYDROGENASE: LDH: 193 U/L — ABNORMAL HIGH (ref 98–192)

## 2018-09-02 MED ORDER — AMOXICILLIN 500 MG PO CAPS: 2000.0000 mg | ORAL_CAPSULE | Freq: Once | ORAL | 3 refills | Status: AC

## 2018-09-02 MED ORDER — FUROSEMIDE 40 MG PO TABS: 20.0000 mg | ORAL_TABLET | ORAL | 6 refills | Status: DC | PRN

## 2018-09-02 MED ORDER — SACUBITRIL-VALSARTAN 24-26 MG PO TABS: 1.0000 | ORAL_TABLET | Freq: Two times a day (BID) | ORAL | 4 refills | Status: DC

## 2018-09-02 NOTE — Progress Notes (Signed)
Patient presents for 1 week follow up in VAD Clinic today with significant other [name]. Reports no problems with VAD equipment or concerns with drive line. No episodes of dizziness or falling.   He is still wearing his Nicoderm patch, but says he has "smoked a few" cigarettes.   Upon VAD interrogation PI up into 6s-7s intermittently.  He states that he is not drinking much (less than a liter a day.) Educated on need to increase PO intake to at least 1.5L a day. He verbalized understanding.   Patient/caregiver  surveys completed.    Vital Signs:  Doppler Pressure: 102             Automatc BP: 114/87 (97) HR: 95 SPO2: 100 %  Weight: 159.2 lb w/o eqt Last weight: 149 lb Home weights: 139-148 lbs   VAD Indication: Destination Therapy    VAD interrogation & Equipment Management: Speed: 5700 Flow: 4.2 Power: 4.4 w PI: 5.7  Alarms: no clinical alarms Events: 5-15 daily  Fixed speed: 5700 Low speed limit: 5400  Primary Controller: Replace back up battery in 30 months. Back up controller: Replace back up battery in 30months.  Annual Equipment Maintenance on UBC/PM was performed on 07/2018.  I reviewed the LVAD parameters from todayand compared the results to the patient's prior recorded data.LVAD interrogation was NEGATIVEfor significant power changes, NEGATIVEfor clinicalalarms and STABLEfor PI events/speed drops. No programming changes were madeand pump is functioning within specified parameters. Pt is performing daily controller and system monitor self tests along with completing weekly and monthly maintenance for LVAD equipment.  LVAD equipment check completed and is in good working order. Back-up equipment present.   Exit Site Care: Drive line is being maintained [employer].  Existing VAD dressing removed and site care performed using sterile technique. Drive line exit site cleaned with Chlora prep applicators x 2, allowed to dry, and  gauze dressing with silver strip re-applied. Exit site healing and unincorporated, the velour is fully implanted at exit site. No redness, tenderness, drainage, foul odor or rash noted. Drive line anchor re-applied. Pt denies fever or chills. One more week of twice a week dressing changes.    BP &Labs:  MAP 102- Doppler is reflecting MAP  Hgb 9.6- No S/S of bleeding. Specifically denies melena/BRBPR or nosebleeds.  LDH stable at 193with established baseline of 175 - 255. Denies tea-colored urine. No power elevations noted on interrogation.    Plan: 1. Stop Losartan.  2. Start Entresto 24-26 BID 3. Lasix PRN- will have patient call VAD pager if he feels fluid is on board to assess VAD numbers prior to taking lasix. His PI has been running high intermittently from lack of PO intake.  4. Discussed need to increase PO intake.  5. Return to VAD clinic in 1 week.    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  Cardiomyopathy was diagnosed in 6/19 in [place], [place] at that time showed low output.  He was admitted to [facility] in 9/19 with low output HF and was started on milrinone and diuresed.  Unable to wean off milrinone.  He had a degree of RV failure, but this improved on milrinone.  Valvular heart disease also looked better with  milrinone and diuresis.  On 08/03/18, he had Heartmate 3 LVAD placed.  Speed was optimized by ramp echo post-op.  Post-op course was relatively unremarkable.    Labs (9/19): LDH 210, INR 2.17, WBCs 17.1 => 15, hgb 9.7, creatinine 0.72 Labs (10/19): K 4.3, creatinine 0.78  Feeling great today. Appetite improving and being more active. Waiting to hear back from CR.  Walked 1 mile + around the [employer] this week. Denies LVAD alarms, driveline trauma, erythema, or drainage. No ICD shocks. Have encouraged fluid intake with previous dehydration. Infrequent PI events.   PMH: 1. Degenerative disc disease.  2. GERD 3. Chronic systolic CHF:  Nonischemic cardiomyopathy.  Dilated cardiomyopathy diagnosed 6/19 in [place].  LHC/RHC in 7/19 showed elevated filling pressures, low cardiac output, and no significant CAD.   - Echo (9/19): Severe LV dilation with EF 10-20%, moderate-severe MR, severely dilated RV with mildly decreased systolic function, severe TR.  - Cardiac MRI (9/19): EF 14%, moderate dilated LV, severely dilated RV with mod-severe systolic function and EF 25%, nonspecific RV insertion site LGE.  - RHC (5/19) on milrinone 0.375: mean RA 8, PA 40/19, mean PCWP 12, PAPi 2.65, CI 2.71.  - Echo (9/19, on milrinone and diuresed): EF 20-25% with moderate LV dilation, moderately dilated RV with mildly decreased systolic function, mild-moderate MR, moderate TR, PASP 51 mmHg.  Cannot rule out noncompaction.  - Heartmate 3 LVAD placement in 9/19.  4. OSA: CPAP use.  5. Active smoker 6. Type 2 diabetes 7. Hyperlipidemia.  8. H/o NSVT 9. 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  10. RLE DVT: found in 9/19.   Current Outpatient Medications  Medication Sig Dispense Refill  . aspirin EC 81 MG tablet Take 81 mg by mouth daily.    Marland Kitchen atorvastatin (LIPITOR) 20 MG tablet Take 20 mg by mouth daily.     . carbamide peroxide (DEBROX) 6.5 % otic solution Place 5 drops into both ears 3 (three) times daily. 5-10 drops    . clotrimazole (LOTRIMIN) 1 % external solution Apply 1 application topically daily.    . cyanocobalamin 500 MCG tablet Take 500 mcg by mouth daily.    Marland Kitchen docusate sodium (COLACE) 100 MG capsule Take 2 capsules (200 mg total) by mouth daily as needed for mild constipation. 10 capsule 0  . DULoxetine (CYMBALTA) 60 MG  capsule Take 60 mg by mouth daily.    . fluticasone (FLONASE) 50 MCG/ACT nasal spray Place 1 spray into both nostrils 2 (two) times daily as needed for allergies.     . furosemide (LASIX) 40 MG tablet Take 0.5 tablets (20 mg total) by mouth daily. 60 tablet 6  . gabapentin (NEURONTIN) 300 MG capsule Take 600 mg by mouth 3 (three) times daily.    . hydrOXYzine (VISTARIL) 25 MG capsule Take 25 mg by mouth 3 (three) times daily as needed for anxiety.    . Ipratropium-Albuterol (COMBIVENT) 20-100 MCG/ACT AERS respimat Inhale 1 puff into the lungs every 6 (six) hours.    Marland Kitchen losartan (COZAAR) 25 MG tablet Take 1 tablet (25 mg total) by mouth 2 (two) times daily. 30 tablet 6  . metFORMIN (GLUCOPHAGE-XR) 500 MG 24 hr tablet Take 500 mg by mouth 2 (two) times daily.    . methocarbamol (ROBAXIN) 750 MG tablet Take 750 mg by mouth 3 (three) times daily as needed for muscle spasms.     . nicotine (NICODERM CQ - DOSED IN MG/24 HR) 7 mg/24hr patch Place  1 patch (7 mg total) onto the skin daily as needed (tobacco craving). 28 patch 0  . omeprazole (PRILOSEC) 20 MG capsule Take 20 mg by mouth daily.    Marland Kitchen oxyCODONE (OXY IR/ROXICODONE) 5 MG immediate release tablet Take 1 tablet (5 mg total) by mouth every 8 (eight) hours as needed for severe pain. 30 tablet 0  . polyethylene glycol (MIRALAX / GLYCOLAX) packet Take 17 g by mouth daily as needed for mild constipation.    . sildenafil (VIAGRA) 100 MG tablet Take 50 mg by mouth daily as needed for erectile dysfunction.    Marland Kitchen spironolactone (ALDACTONE) 25 MG tablet Take 0.5 tablets (12.5 mg total) by mouth daily. 30 tablet 6  . thiamine 100 MG tablet Take 100 mg by mouth daily.    . traMADol (ULTRAM) 50 MG tablet Take 1 tablet (50 mg total) by mouth every 4 (four) hours as needed for moderate pain. 30 tablet 0  . warfarin (COUMADIN) 5 MG tablet Take 7.5 mg daily except 5 mg on Wednesday and Friday 45 tablet 6   No current facility-administered medications for this  encounter.     Patient has no known allergies.  Review of systems complete and found to be negative unless listed in HPI.     LVAD INTERROGATION:  Please see LVAD nurse's note above for details.   I reviewed the LVAD parameters from today, and compared the results to the patient's prior recorded data.  No programming changes were made.  The LVAD is functioning within specified parameters.  The patient performs LVAD self-test daily.  LVAD interrogation was negative for any significant power changes, alarms or PI events/speed drops.  LVAD equipment check completed and is in good working order.  Back-up equipment present.   LVAD education done on emergency procedures and precautions and reviewed exit site care.   Vitals:   09/02/18 0900 09/02/18 0912  BP: (!) 102/0 114/87  Pulse: 95   SpO2: 100%   Weight: 72.2 kg (159 lb 3.2 oz)    Physical Exam: General: Well appearing this am. NAD.  HEENT: Normal. Neck: Supple, JVP 7-8 cm. Carotids OK.  Cardiac:  Mechanical heart sounds with LVAD hum present.  Lungs:  CTAB, normal effort.  Abdomen:  NT, ND, no HSM. No bruits or masses. +BS  LVAD exit site: Well-healed and incorporated. Dressing dry and intact. No erythema or drainage. Stabilization device present and accurately applied. Driveline dressing changed daily per sterile technique. Extremities:  Warm and dry. No cyanosis, clubbing, rash, or edema.  Neuro:  Alert & oriented x 3. Cranial nerves grossly intact. Moves all 4 extremities w/o difficulty. Affect pleasant       ASSESSMENT AND PLAN:   1. 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  - VAD interrogated personally. Parameters stable.   - NYHA II symptoms with recent VAD.  - Volume status stable on exam. Diuretics previously cut back for orthostasis.  - MAPs remain high.  - Stop losartan. Change to Entresto 24/26 mg BID. Reinforced need for hydration.  - Stop lasix. Change to as needed only.    - Continue spironolactone 12.5 mg daily.  - Cardiac rehab in future. Waiting to hear back. Have arranged.  - Continue ASA 81 daily.  - Continue warfarin for INR 2-2.5.  INR 3.72. Discussed dosing with PharmD personally. No evidence of GI bleeding.  - Should be transplant candidate eventually if he can stay off cigarettes.   2. Smoking:  -  Encouraged continued cessation. Using nicotine patches.  3. RLE DVT:  - On coumadin as above.  4. OSA:  - Continue CPAP.  5. Hyperlipidemia:  - Continue Atorvastatin.  6. Type II diabetes:  - Metformin, per PCP.  - Not ideal candidate for Jardiance right now with ongoing difficulties with fluid balance (on dry end)  Graciella Freer, PA-C  09/02/2018 9:31 AM   Greater than 50% of the 45 minute visit was spent in counseling/coordination of care regarding disease state education, salt/fluid restriction, sliding scale diuretics, and medication compliance.

## 2018-09-02 NOTE — Telephone Encounter (Signed)
Spoke with patient regarding Amoxicillan prescription called in for dental appointment Nov 4th with Dr. Chilton Si. Instructed patient to take 4 tablets 4min-1 hour prior to procedure. Patient verbalized understanding.   Alyce Pagan RN VAD Coordinator  Office: [phone]  24/7 Pager: [phone]

## 2018-09-02 NOTE — Progress Notes (Signed)
Patient presents for 1 week follow up in VAD Clinic today with significant other [name]. Reports no problems with VAD equipment or concerns with drive line. No episodes of dizziness or falling.   He is still wearing his Nicoderm patch, but says he has "smoked a few" cigarettes.   Upon VAD interrogation PI up into 6s-7s intermittently.  He states that he is not drinking much (less than a liter a day.) Educated on need to increase PO intake to at least 1.5L a day. He verbalized understanding.   Patient/caregiver  surveys completed.    Vital Signs:  Doppler Pressure: 102  Automatc BP: 114/87 (97) HR: 95  SPO2: 100 %  Weight: 159.2 lb w/o eqt Last weight: 149 lb Home weights: 139-148 lbs   VAD Indication: Destination Therapy    VAD interrogation & Equipment Management: Speed: 5700 Flow: 4.2 Power: 4.4 w    PI: 5.7  Alarms: no clinical alarms Events: 5-15 daily  Fixed speed: 5700 Low speed limit: 5400  Primary Controller:  Replace back up battery in 30 months. Back up controller:   Replace back up battery in 30 months.  Annual Equipment Maintenance on UBC/PM was performed on 07/2018.   I reviewed the LVAD parameters from today and compared the results to the patient's prior recorded data. LVAD interrogation was NEGATIVE for significant power changes, NEGATIVE for clinical alarms and STABLE for PI events/speed drops. No programming changes were made and pump is functioning within specified parameters. Pt is performing daily controller and system monitor self tests along with completing weekly and monthly maintenance for LVAD equipment.  LVAD equipment check completed and is in good working order. Back-up equipment present.   Exit Site Care: Drive line is being maintained daily by [name].  Existing VAD dressing removed and site care performed using sterile technique. Drive line exit site cleaned with Chlora prep applicators x 2, allowed to dry, and gauze dressing with  silver strip re-applied. Exit site healing and unincorporated, the velour is fully implanted at exit site. No redness, tenderness, drainage, foul odor or rash noted. Drive line anchor re-applied. Pt denies fever or chills. One more week of twice a week dressing changes.    BP & Labs:  MAP 102 - Doppler is reflecting MAP  Hgb 9.6- No S/S of bleeding. Specifically denies melena/BRBPR or nosebleeds.  LDH stable at 193 with established baseline of 175 - 255. Denies tea-colored urine. No power elevations noted on interrogation.    Plan: 1. Stop Losartan.  2. Start Entresto 24-26 BID 3. Lasix PRN- will have patient call VAD pager if he feels fluid is on board to assess VAD numbers prior to taking lasix. His PI has been running high intermittently from lack of PO intake.  4. Discussed need to increase PO intake.  5. Return to VAD clinic in 1 week.    Alyce Pagan RN VAD Coordinator  Office: [phone]  24/7 Pager: [phone]

## 2018-09-02 NOTE — Patient Instructions (Addendum)
1. Stop Losartan.  2. Start Entresto 24-26 twice day.  3. Stop daily Lasix- may use if feel like you have fluid on board- please call VAD coordinators prior to taking any doses.  4. Increase by mouth intake.  5. Return to VAD clinic in 1 week

## 2018-09-04 ENCOUNTER — Telehealth (HOSPITAL_COMMUNITY): Payer: Self-pay

## 2018-09-04 ENCOUNTER — Telehealth (HOSPITAL_COMMUNITY): Payer: Self-pay | Admitting: Pharmacist

## 2018-09-04 NOTE — Telephone Encounter (Signed)
2nd attempt to contact patient in regards to Cardiac Rehab - lm on vm. Sent letter.

## 2018-09-04 NOTE — Telephone Encounter (Signed)
Entresto 24-26 mg BID PA approved by [facility] Part D through 11/18/19.   Tyler Deis. Bonnye Fava, PharmD, BCPS, CPP Clinical Pharmacist Phone: [phone] 09/04/2018 12:30 PM

## 2018-09-08 ENCOUNTER — Other Ambulatory Visit (HOSPITAL_COMMUNITY): Payer: Self-pay | Admitting: *Deleted

## 2018-09-08 DIAGNOSIS — Z95811 Presence of heart assist device: Secondary | ICD-10-CM

## 2018-09-08 DIAGNOSIS — Z7901 Long term (current) use of anticoagulants: Secondary | ICD-10-CM

## 2018-09-08 DIAGNOSIS — I5043 Acute on chronic combined systolic (congestive) and diastolic (congestive) heart failure: Secondary | ICD-10-CM

## 2018-09-09 ENCOUNTER — Ambulatory Visit (HOSPITAL_COMMUNITY)
Admission: RE | Admit: 2018-09-09 | Discharge: 2018-09-09 | Disposition: A | Discharge status: Home / Self Care | Payer: No Typology Code available for payment source | Source: Ambulatory Visit | Attending: Internal Medicine | Admitting: Internal Medicine

## 2018-09-09 ENCOUNTER — Encounter (HOSPITAL_COMMUNITY): Payer: Self-pay

## 2018-09-09 ENCOUNTER — Ambulatory Visit (HOSPITAL_COMMUNITY): Payer: Self-pay | Admitting: Pharmacist

## 2018-09-09 VITALS — BP 109/81 | HR 94 | Ht 70.0 in | Wt 156.4 lb

## 2018-09-09 DIAGNOSIS — E785 Hyperlipidemia, unspecified: Secondary | ICD-10-CM | POA: Insufficient documentation

## 2018-09-09 DIAGNOSIS — Z86718 Personal history of other venous thrombosis and embolism: Secondary | ICD-10-CM | POA: Diagnosis not present

## 2018-09-09 DIAGNOSIS — K219 Gastro-esophageal reflux disease without esophagitis: Secondary | ICD-10-CM | POA: Diagnosis not present

## 2018-09-09 DIAGNOSIS — Z87891 Personal history of nicotine dependence: Secondary | ICD-10-CM | POA: Diagnosis not present

## 2018-09-09 DIAGNOSIS — I428 Other cardiomyopathies: Secondary | ICD-10-CM | POA: Diagnosis not present

## 2018-09-09 DIAGNOSIS — Z7901 Long term (current) use of anticoagulants: Secondary | ICD-10-CM | POA: Insufficient documentation

## 2018-09-09 DIAGNOSIS — I420 Dilated cardiomyopathy: Secondary | ICD-10-CM | POA: Diagnosis not present

## 2018-09-09 DIAGNOSIS — Z79899 Other long term (current) drug therapy: Secondary | ICD-10-CM | POA: Insufficient documentation

## 2018-09-09 DIAGNOSIS — E119 Type 2 diabetes mellitus without complications: Secondary | ICD-10-CM | POA: Insufficient documentation

## 2018-09-09 DIAGNOSIS — I10 Essential (primary) hypertension: Secondary | ICD-10-CM | POA: Diagnosis not present

## 2018-09-09 DIAGNOSIS — K761 Chronic passive congestion of liver: Secondary | ICD-10-CM | POA: Insufficient documentation

## 2018-09-09 DIAGNOSIS — Z7982 Long term (current) use of aspirin: Secondary | ICD-10-CM | POA: Diagnosis not present

## 2018-09-09 DIAGNOSIS — G4733 Obstructive sleep apnea (adult) (pediatric): Secondary | ICD-10-CM | POA: Insufficient documentation

## 2018-09-09 DIAGNOSIS — I5022 Chronic systolic (congestive) heart failure: Secondary | ICD-10-CM | POA: Insufficient documentation

## 2018-09-09 DIAGNOSIS — Z95811 Presence of heart assist device: Secondary | ICD-10-CM | POA: Diagnosis not present

## 2018-09-09 DIAGNOSIS — Z4509 Encounter for adjustment and management of other cardiac device: Secondary | ICD-10-CM | POA: Insufficient documentation

## 2018-09-09 DIAGNOSIS — I5043 Acute on chronic combined systolic (congestive) and diastolic (congestive) heart failure: Secondary | ICD-10-CM | POA: Diagnosis not present

## 2018-09-09 DIAGNOSIS — Z7984 Long term (current) use of oral hypoglycemic drugs: Secondary | ICD-10-CM | POA: Diagnosis not present

## 2018-09-09 LAB — CBC
HEMATOCRIT: 34.7 % — AB (ref 39.0–52.0)
HEMOGLOBIN: 10.2 g/dL — AB (ref 13.0–17.0)
MCH: 26.1 pg (ref 26.0–34.0)
MCHC: 29.4 g/dL — ABNORMAL LOW (ref 30.0–36.0)
MCV: 88.7 fL (ref 80.0–100.0)
NRBC: 0 % (ref 0.0–0.2)
Platelets: 319 10*3/uL (ref 150–400)
RBC: 3.91 MIL/uL — AB (ref 4.22–5.81)
RDW: 14.8 % (ref 11.5–15.5)
WBC: 7.7 10*3/uL (ref 4.0–10.5)

## 2018-09-09 LAB — PROTIME-INR
INR: 2.51
Prothrombin Time: 26.7 seconds — ABNORMAL HIGH (ref 11.4–15.2)

## 2018-09-09 LAB — BASIC METABOLIC PANEL
ANION GAP: 5 (ref 5–15)
BUN: 8 mg/dL (ref 6–20)
CALCIUM: 9.2 mg/dL (ref 8.9–10.3)
CHLORIDE: 109 mmol/L (ref 98–111)
CO2: 28 mmol/L (ref 22–32)
Creatinine, Ser: 0.8 mg/dL (ref 0.61–1.24)
GFR calc Af Amer: >60 mL/min (ref >60)
GFR calc non Af Amer: >60 mL/min (ref >60)
Glucose, Bld: 104 mg/dL — ABNORMAL HIGH (ref 70–99)
Potassium: 4.4 mmol/L (ref 3.5–5.1)
Sodium: 142 mmol/L (ref 135–145)

## 2018-09-09 LAB — LACTATE DEHYDROGENASE: LDH: 193 U/L — ABNORMAL HIGH (ref 98–192)

## 2018-09-09 MED ORDER — SACUBITRIL-VALSARTAN 49-51 MG PO TABS: 1.0000 | ORAL_TABLET | Freq: Two times a day (BID) | ORAL | 6 refills | Status: DC

## 2018-09-09 NOTE — Patient Instructions (Signed)
1. Increase Entresto to 49/51 mg twice daily. 2. Advance dressing changes to once weekly using weekly dressing kit.  5. Return to [place] clinic in 2 week.

## 2018-09-09 NOTE — Progress Notes (Signed)
error 

## 2018-09-09 NOTE — Progress Notes (Addendum)
Patient presents for 1 week follow up in [facility] today with significant other [name]. Reports no problems with VAD equipment or concerns with drive line.   Pt did stop Losartan and started Entresto 24-26 mg bid. Reports one dizzy episode with standing during past week; resolved after lying down for few mins.  He is wearing his Nicoderm patch and reports no smoking.    Pt has increased his fluid intake to > 1.5 liter/day.   Pt has  F/u with [place] pod[facility] 08/04/18. He has dental appt on 09/21/18 for cleaning. Pt has picked up prophylactic antibiotic for any dental procedure.   Vital Signs:  Doppler Pressure: 100  Automatc BP: 109/81 (101) HR: 94 SPO2: 100 % on RA  Weight: 156.4lb w/o eqt Last weight: 159.2 lb Home weights: 151 - 159 lbs  VAD Indication: Destination Therapy    VAD interrogation & Equipment Management: Speed: 5700 Flow: 4.3 Power: 4.4 w    PI: 5.7 Hct: 34  Alarms: no clinical alarms Events: 5-15 daily  Fixed speed: 5700 Low speed limit: 5400  Primary Controller:  Replace back up battery in 30 months. Back up controller:   Replace back up battery in 30 months.  Annual Equipment Maintenance on UBC/PM was performed on 07/2018.   I reviewed the LVAD parameters from today and compared the results to the patient's prior recorded data. LVAD interrogation was NEGATIVE for significant power changes, NEGATIVE for clinical alarms and STABLE for PI events/speed drops. No programming changes were made and pump is functioning within specified parameters. Pt is performing daily controller and system monitor self tests along with completing weekly and monthly maintenance for LVAD equipment.  LVAD equipment check completed and is in good working order. Back-up equipment present.   Exit Site Care: Drive line is being maintained weekly by [employer] using gauze dressing.  Existing VAD dressing removed and site care performed using sterile technique. Drive line exit site  cleaned with Chlora prep applicators x 2, allowed to dry, and gauze dressing with silver strip re-applied. Exit site incorporated, the velour is fully implanted at exit site. Some crusty residue around exit site removed with cleaning; no redness, tenderness, drainage, foul odor or rash noted. Drive line anchor re-applied. Pt denies fever or chills. Will advance to weekly dressing changes with weekly kit using Sorbaview dressing. Demonstrated use to [place] with verbal understanding. Anchor x 2 replaced. Provided pt with 3 weekly dressing kits and 3 extra anchors.     BP & Labs:  MAP 100 - Doppler is reflecting MAP  Hgb 10.2 - No S/S of bleeding. Specifically denies melena/BRBPR or nosebleeds.  LDH stable at 193 with established baseline of 175 - 255. Denies tea-colored urine. No power elevations noted on interrogation.   ICD: N/A   Plan: 1. Increase Entresto to 49/51 mg twice daily. 2. Advance dressing changes to once weekly using weekly dressing kit.  5. Return to [place] [facility] in 2 week.   Zada Girt RN VAD Coordinator  Office: [phone]  24/7 Pager: [phone]   EQA:[id number], Junious Dresser, MD  Cardiology: Dr. [name]  57 yo with history of nonischemic cardiomyopathy, RLE DVT, cirrhosis, smoking, and OSA returns for followup of CHF/LVAD placement.  Cardiomyopathy was diagnosed in 6/19 in [place], [place] at that time showed low output.  He was admitted to [facility] in 9/19 with low output HF and was started on milrinone and diuresed.  Unable to wean off milrinone.  He had a degree of RV failure, but this improved  on milrinone.  Valvular heart disease also looked better with milrinone and diuresis.  On 08/03/18, he had Heartmate 3 LVAD placed.  Speed was optimized by ramp echo post-op.  Post-op course was relatively unremarkable.    He is doing well today.  Uses cane or walker for balance still but denies lightheadedness.  Not short of breath with ambulation.  Going to start cardiac rehab soon.    LVAD parameters stable.  MAP mildly elevated in 90s.     Labs (9/19): LDH 210, INR 2.17, WBCs 17.1 => 15, hgb 9.7, creatinine 0.72 Labs (10/19): K 4.3, creatinine 0.78 => 0.83, hgb 10.2  Denies LVAD alarms.  Denies driveline trauma, erythema or drainage.  Denies ICD shocks.   Reports taking Coumadin as prescribed and adherence to anticoagulation based dietary restrictions.  Denies bright red blood per rectum or melena, no dark urine or hematuria.    PMH: 1. Degenerative disc disease.  2. GERD 3. Chronic systolic CHF:  Nonischemic cardiomyopathy.  Dilated cardiomyopathy diagnosed 6/19 in [place].  LHC/RHC in 7/19 showed elevated filling pressures, low cardiac output, and no significant CAD.   - Echo (9/19): Severe LV dilation with EF 10-20%, moderate-severe MR, severely dilated RV with mildly decreased systolic function, severe TR.  - Cardiac MRI (9/19): EF 14%, moderate dilated LV, severely dilated RV with mod-severe systolic function and EF 19%, nonspecific RV insertion site LGE.  - RHC (5/19) on milrinone 0.375: mean RA 8, PA 40/19, mean PCWP 12, PAPi 2.65, CI 2.71.  - Echo (9/19, on milrinone and diuresed): EF 20-25% with moderate LV dilation, moderately dilated RV with mildly decreased systolic function, mild-moderate MR, moderate TR, PASP 51 mmHg.  Cannot rule out noncompaction.  - Heartmate 3 LVAD placement in 9/19.  4. OSA: CPAP use.  5. Active smoker 6. Type 2 diabetes 7. Hyperlipidemia.  8. H/o NSVT 9. 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  10. RLE DVT: found in 9/19.   Current Outpatient Medications  Medication Sig Dispense Refill  . aspirin EC 81 MG tablet Take 81 mg by mouth daily.    Marland Kitchen atorvastatin (LIPITOR) 20 MG tablet Take 20 mg by mouth daily.     . carbamide peroxide (DEBROX) 6.5 % otic solution Place 5 drops into both ears 3 (three) times daily. 5-10 drops    . clotrimazole (LOTRIMIN) 1 % external solution Apply 1 application topically  daily.    . cyanocobalamin 500 MCG tablet Take 500 mcg by mouth daily.    . DULoxetine (CYMBALTA) 60 MG capsule Take 60 mg by mouth daily.    . fluticasone (FLONASE) 50 MCG/ACT nasal spray Place 1 spray into both nostrils 2 (two) times daily as needed for allergies.     Marland Kitchen gabapentin (NEURONTIN) 300 MG capsule Take 600 mg by mouth 3 (three) times daily.    . hydrOXYzine (VISTARIL) 25 MG capsule Take 25 mg by mouth 3 (three) times daily as needed for anxiety.    . Ipratropium-Albuterol (COMBIVENT) 20-100 MCG/ACT AERS respimat Inhale 1 puff into the lungs every 6 (six) hours.    . metFORMIN (GLUCOPHAGE-XR) 500 MG 24 hr tablet Take 500 mg by mouth 2 (two) times daily.    . methocarbamol (ROBAXIN) 750 MG tablet Take 750 mg by mouth 3 (three) times daily as needed for muscle spasms.     . nicotine (NICODERM CQ - DOSED IN MG/24 HR) 7 mg/24hr patch Place 1 patch (7 mg total) onto the skin daily as needed (tobacco  craving). 28 patch 0  . omeprazole (PRILOSEC) 20 MG capsule Take 20 mg by mouth daily.    Marland Kitchen oxyCODONE (OXY IR/ROXICODONE) 5 MG immediate release tablet Take 1 tablet (5 mg total) by mouth every 8 (eight) hours as needed for severe pain. 30 tablet 0  . sildenafil (VIAGRA) 100 MG tablet Take 50 mg by mouth daily as needed for erectile dysfunction.    Marland Kitchen spironolactone (ALDACTONE) 25 MG tablet Take 0.5 tablets (12.5 mg total) by mouth daily. 30 tablet 6  . thiamine 100 MG tablet Take 100 mg by mouth daily.    . traMADol (ULTRAM) 50 MG tablet Take 1 tablet (50 mg total) by mouth every 4 (four) hours as needed for moderate pain. 30 tablet 0  . warfarin (COUMADIN) 5 MG tablet Take 1 tablet (5 mg) daily except 1 and 1/2 tablets (7.5 mg ) on Monday and Friday    . docusate sodium (COLACE) 100 MG capsule Take 2 capsules (200 mg total) by mouth daily as needed for mild constipation. (Patient not taking: Reported on 09/09/2018) 10 capsule 0  . furosemide (LASIX) 40 MG tablet Take 0.5 tablets (20 mg total) by  mouth as needed. (Patient not taking: Reported on 09/09/2018) 60 tablet 6  . polyethylene glycol (MIRALAX / GLYCOLAX) packet Take 17 g by mouth daily as needed for mild constipation.    . sacubitril-valsartan (ENTRESTO) 49-51 MG Take 1 tablet by mouth 2 (two) times daily. 60 tablet 6   No current facility-administered medications for this encounter.     Patient has no known allergies.  REVIEW OF SYSTEMS: All systems negative except as listed in HPI, PMH and Problem list.   LVAD INTERROGATION:  Please see LVAD nurse's note above for details.    I reviewed the LVAD parameters from today, and compared the results to the patient's prior recorded data.  No programming changes were made.  The LVAD is functioning within specified parameters.  The patient performs LVAD self-test daily.  LVAD interrogation was negative for any significant power changes, alarms or PI events/speed drops.  LVAD equipment check completed and is in good working order.  Back-up equipment present.   LVAD education done on emergency procedures and precautions and reviewed exit site care.    Vitals:   09/09/18 0922 09/09/18 0923  BP: (!) 100/0 109/81  Pulse:  94  SpO2:  100%  Weight:  70.9 kg (156 lb 6.4 oz)  Height:  _0  (1.778 m)    Physical Exam: General: Well appearing this am. NAD.  HEENT: Normal. Neck: Supple, JVP 7-8 cm. Carotids OK.  Cardiac:  Mechanical heart sounds with LVAD hum present.  Lungs:  CTAB, normal effort.  Abdomen:  NT, ND, no HSM. No bruits or masses. +BS  LVAD exit site: Well-healed and incorporated. Dressing dry and intact. No erythema or drainage. Stabilization device present and accurately applied. Driveline dressing changed daily per sterile technique. Extremities:  Warm and dry. No cyanosis, clubbing, rash, or edema.  Neuro:  Alert & oriented x 3. Cranial nerves grossly intact. Moves all 4 extremities w/o difficulty. Affect pleasant        ASSESSMENT AND PLAN:   1. Chronic  systolic CHF: Nonischemic cardiomyopathy, now s/p Heartmate 3 LVAD in 9/19.  LVAD parameters stable, no suction or low flow.  No orthostatic symptoms, NYHA class II.  Few PI events.  Looks euvolemic, weight down 3 lbs.      - Can continue prn Lasix.    -  Continue spironolactone 12.5 mg daily.  - Increase Entresto to 49/51 bid with elevated MAP   - To start CR soon.   - Continue ASA 81 daily.  - Continue warfarin for INR 2-2.5.  No evidence for GI bleeding.  Hgb stable today.  - Should be transplant candidate eventually if he can stay off cigarettes.   2. Smoking: He has quit, using nicotine patches.  3. RLE DVT: On warfarin for LVAD.  4. OSA: Continue CPAP.  5. Hyperlipidemia: Atorvastatin.  6. Type II diabetes: Metformin, per PCP.   Loralie Champagne 09/09/2018 1:21 PM

## 2018-09-10 ENCOUNTER — Encounter (HOSPITAL_COMMUNITY): Payer: Self-pay

## 2018-09-10 NOTE — Addendum Note (Signed)
Encounter addended by: Marcy Siren, LCSW on: 09/10/2018 4:25 PM  Actions taken: Visit Navigator Flowsheet section accepted

## 2018-09-11 ENCOUNTER — Telehealth (HOSPITAL_COMMUNITY): Payer: Self-pay

## 2018-09-11 NOTE — Telephone Encounter (Signed)
3rd attempt to contact pt in regards to CR - lm on vm

## 2018-09-11 NOTE — Telephone Encounter (Signed)
Patient returned Cardiac Rehab phone call and stated he is interested in participate in Cardiac Rehab. Patient will come in for orientation on 11/24/2018 @ 1:30PM and will attend the 8:15AM exercise class.  Mailed homework package.  Went over insurance, patient verbalized understanding.

## 2018-09-21 ENCOUNTER — Other Ambulatory Visit (HOSPITAL_COMMUNITY): Payer: Self-pay | Admitting: *Deleted

## 2018-09-21 DIAGNOSIS — Z95811 Presence of heart assist device: Secondary | ICD-10-CM

## 2018-09-21 DIAGNOSIS — Z7901 Long term (current) use of anticoagulants: Secondary | ICD-10-CM

## 2018-09-23 ENCOUNTER — Encounter (HOSPITAL_COMMUNITY): Payer: Self-pay

## 2018-09-23 ENCOUNTER — Ambulatory Visit (HOSPITAL_COMMUNITY)
Admission: RE | Admit: 2018-09-23 | Discharge: 2018-09-23 | Disposition: A | Discharge status: Home / Self Care | Payer: Medicare Other | Source: Ambulatory Visit | Attending: Internal Medicine | Admitting: Internal Medicine

## 2018-09-23 ENCOUNTER — Ambulatory Visit (HOSPITAL_COMMUNITY): Payer: Self-pay | Admitting: Pharmacist

## 2018-09-23 DIAGNOSIS — E119 Type 2 diabetes mellitus without complications: Secondary | ICD-10-CM | POA: Diagnosis not present

## 2018-09-23 DIAGNOSIS — Z7901 Long term (current) use of anticoagulants: Secondary | ICD-10-CM | POA: Diagnosis present

## 2018-09-23 DIAGNOSIS — Z86718 Personal history of other venous thrombosis and embolism: Secondary | ICD-10-CM | POA: Insufficient documentation

## 2018-09-23 DIAGNOSIS — Z87891 Personal history of nicotine dependence: Secondary | ICD-10-CM | POA: Insufficient documentation

## 2018-09-23 DIAGNOSIS — Z95811 Presence of heart assist device: Secondary | ICD-10-CM

## 2018-09-23 DIAGNOSIS — Z7982 Long term (current) use of aspirin: Secondary | ICD-10-CM | POA: Diagnosis not present

## 2018-09-23 DIAGNOSIS — I420 Dilated cardiomyopathy: Secondary | ICD-10-CM | POA: Diagnosis not present

## 2018-09-23 DIAGNOSIS — Z7984 Long term (current) use of oral hypoglycemic drugs: Secondary | ICD-10-CM | POA: Diagnosis not present

## 2018-09-23 DIAGNOSIS — K219 Gastro-esophageal reflux disease without esophagitis: Secondary | ICD-10-CM | POA: Insufficient documentation

## 2018-09-23 DIAGNOSIS — K7030 Alcoholic cirrhosis of liver without ascites: Secondary | ICD-10-CM | POA: Insufficient documentation

## 2018-09-23 DIAGNOSIS — I5022 Chronic systolic (congestive) heart failure: Secondary | ICD-10-CM

## 2018-09-23 DIAGNOSIS — E785 Hyperlipidemia, unspecified: Secondary | ICD-10-CM | POA: Insufficient documentation

## 2018-09-23 DIAGNOSIS — G4733 Obstructive sleep apnea (adult) (pediatric): Secondary | ICD-10-CM | POA: Diagnosis not present

## 2018-09-23 DIAGNOSIS — Z79899 Other long term (current) drug therapy: Secondary | ICD-10-CM | POA: Insufficient documentation

## 2018-09-23 LAB — BASIC METABOLIC PANEL
ANION GAP: 7 (ref 5–15)
BUN: 9 mg/dL (ref 6–20)
CHLORIDE: 108 mmol/L (ref 98–111)
CO2: 27 mmol/L (ref 22–32)
Calcium: 9.4 mg/dL (ref 8.9–10.3)
Creatinine, Ser: 0.79 mg/dL (ref 0.61–1.24)
GFR calc non Af Amer: >60 mL/min (ref >60)
Glucose, Bld: 98 mg/dL (ref 70–99)
POTASSIUM: 3.5 mmol/L (ref 3.5–5.1)
Sodium: 142 mmol/L (ref 135–145)

## 2018-09-23 LAB — CBC
HEMATOCRIT: 34.4 % — AB (ref 39.0–52.0)
Hemoglobin: 10.3 g/dL — ABNORMAL LOW (ref 13.0–17.0)
MCH: 25.8 pg — ABNORMAL LOW (ref 26.0–34.0)
MCHC: 29.9 g/dL — AB (ref 30.0–36.0)
MCV: 86.2 fL (ref 80.0–100.0)
Platelets: 257 10*3/uL (ref 150–400)
RBC: 3.99 MIL/uL — ABNORMAL LOW (ref 4.22–5.81)
RDW: 14.4 % (ref 11.5–15.5)
WBC: 7.4 10*3/uL (ref 4.0–10.5)
nRBC: 0 % (ref 0.0–0.2)

## 2018-09-23 LAB — PROTIME-INR
INR: 2.28
Prothrombin Time: 24.8 seconds — ABNORMAL HIGH (ref 11.4–15.2)

## 2018-09-23 LAB — LACTATE DEHYDROGENASE: LDH: 185 U/L (ref 98–192)

## 2018-09-23 NOTE — Patient Instructions (Addendum)
1. No medication changes. 2. May increase salt and fluid intake.  3. Return to clinic in 2 weeks for follow up.  4. You may now shower  Here are some tips for washing:  . Avoid getting the driveline exit site dressing wet, and consider planning bathing times around exit site dressing changes. . Use only the shower bag we gave you to shower with and don't get creative with your equipment to shower. Water and electricity do not mix and will cause your pump to stop.   . Sit on a chair or bathing stool in the bathtub or shower stall, and use a basin of warm water and washcloth or sponge to wash. Or, wash at the sink while standing on a towel, so as not to get the floor or bath rug wet. . To wash your hair, try using a hand-held shower wand or sprayer while standing over the kitchen sink or bathtub. . Be sure that all floor surfaces are dry when walking around after bathing, to avoid slipping. . Do not use powder around the exit site dressing. . Anytime your dressing appears wet CHANGE IT!

## 2018-09-23 NOTE — Progress Notes (Addendum)
Patient presents for 2 week follow up in [facility] today with significant other [name]. Reports no problems with VAD equipment or concerns with drive line.   Denies any dizzy spells, or falls. States he has been drinking over 2 liters per day, and that he is urinating frequently.   He is wearing his Nicoderm patch and reports no smoking.    Patient had dental appt scheduled on 09/21/18 but the office had to reschedule. Pt has picked up prophylactic antibiotic for any dental procedure.  Patient states that he is starting cardiac rehab on January 7th.   Shower education provided. Patient and [name] verbalized understanding of all instructions.  . Avoid getting the driveline exit site dressing wet, and consider planning bathing times around exit site dressing changes. . Use only the shower bag we gave you to shower with and don't get creative with your equipment to shower. Water and electricity do not mix and will cause your pump to stop.   . Sit on a chair or bathing stool in the bathtub or shower stall, and use a basin of warm water and washcloth or sponge to wash. Or, wash at the sink while standing on a towel, so as not to get the floor or bath rug wet. . To wash your hair, try using a hand-held shower wand or sprayer while standing over the kitchen sink or bathtub. . Be sure that all floor surfaces are dry when walking around after bathing, to avoid slipping. . Do not use powder around the exit site dressing. . Anytime your dressing appears wet CHANGE IT!  Vital Signs:  Doppler Pressure: 100  Automatc BP: 129/71 (79) HR: 84 SPO2: 99 % on RA  Weight: 158.4lb w/o eqt Last weight: 156.4 lb Home weights: 151 - 159 lbs  VAD Indication: Destination Therapy    VAD interrogation & Equipment Management: Speed: 5700 Flow: 4.4 Power: 4.4 w    PI: 4.1 Hct: 34  Alarms: 1 low voltage Events: 30-42 per day  Fixed speed: 5700 Low speed limit: 5400  Primary Controller:  Replace  back up battery in 29 months. Back up controller:   Replace back up battery in 30 months.  Annual Equipment Maintenance on UBC/PM was performed on 07/2018.   I reviewed the LVAD parameters from today and compared the results to the patient's prior recorded data. LVAD interrogation was NEGATIVE for significant power changes, NEGATIVE for clinical alarms and STABLE for PI events/speed drops. No programming changes were made and pump is functioning within specified parameters. Pt is performing daily controller and system monitor self tests along with completing weekly and monthly maintenance for LVAD equipment.  LVAD equipment check completed and is in good working order. Back-up equipment present.   Exit Site Care: Drive line is being maintained weekly by [employer] using gauze dressing.  Existing VAD dressing removed and site care performed using sterile technique. Drive line exit site cleaned with Chlora prep applicators x 2, allowed to dry, and gauze dressing with silver strip re-applied. Exit site incorporated, the velour is fully implanted at exit site. Some crusty residue around exit site removed with cleaning; no redness, tenderness, drainage, foul odor or rash noted. Drive line anchor re-applied. Pt denies fever or chills. Will continue weekly dressing changes with weekly kit using Sorbaview dressing. Demonstrated use to [place] with verbal understanding. Anchor x 2 replaced. Provided pt with 4 weekly dressing kits and 4 extra anchors.     BP & Labs:  MAP 100 - Doppler  is reflecting MAP  Hgb 10.3  - No S/S of bleeding. Specifically denies melena/BRBPR or nosebleeds.  LDH stable at 185  with established baseline of 175 - 255. Denies tea-colored urine. No power elevations noted on interrogation.   ICD: N/A   Plan: 1. No medication changes. 2. May increase salt and fluid intake.  3. Return to clinic in 2 weeks for follow up.  4. Patient may shower.   Emerson Monte RN VAD  Coordinator  Office: [phone]  24/7 Pager: [phone]   BLT:[id number], Junious Dresser, MD  Cardiology: Dr. [name]  57 yo with history of nonischemic cardiomyopathy, RLE DVT, cirrhosis, smoking, and OSA returns for followup of CHF/LVAD placement.  Cardiomyopathy was diagnosed in 6/19 in [place], [place] at that time showed low output.  He was admitted to [facility] in 9/19 with low output HF and was started on milrinone and diuresed.  Unable to wean off milrinone.  He had a degree of RV failure, but this improved on milrinone.  Valvular heart disease also looked better with milrinone and diuresis.  On 08/03/18, he had Heartmate 3 LVAD placed.  Speed was optimized by ramp echo post-op.  Post-op course was relatively unremarkable.    He is doing well today. Frequent PI events, no low flows noted on device interrogation.  No exertional dyspnea walking on flat ground.  Able to do all ADLs.  No lightheadedness.  Using nicotine patch and not smoking.   Labs (9/19): LDH 210, INR 2.17, WBCs 17.1 => 15, hgb 9.7, creatinine 0.72 Labs (10/19): K 4.3, creatinine 0.78 => 0.83, hgb 10.2  Denies LVAD alarms.  Denies driveline trauma, erythema or drainage.  Denies ICD shocks.   Reports taking Coumadin as prescribed and adherence to anticoagulation based dietary restrictions.  Denies bright red blood per rectum or melena, no dark urine or hematuria.    PMH: 1. Degenerative disc disease.  2. GERD 3. Chronic systolic CHF:  Nonischemic cardiomyopathy.  Dilated cardiomyopathy diagnosed 6/19 in [place].  LHC/RHC in 7/19 showed elevated filling pressures, low cardiac output, and no significant CAD.   - Echo (9/19): Severe LV dilation with EF 10-20%, moderate-severe MR, severely dilated RV with mildly decreased systolic function, severe TR.  - Cardiac MRI (9/19): EF 14%, moderate dilated LV, severely dilated RV with mod-severe systolic function and EF 23%, nonspecific RV insertion site LGE.  - RHC (5/19) on milrinone 0.375: mean  RA 8, PA 40/19, mean PCWP 12, PAPi 2.65, CI 2.71.  - Echo (9/19, on milrinone and diuresed): EF 20-25% with moderate LV dilation, moderately dilated RV with mildly decreased systolic function, mild-moderate MR, moderate TR, PASP 51 mmHg.  Cannot rule out noncompaction.  - Heartmate 3 LVAD placement in 9/19.  4. OSA: CPAP use.  5. Prior smoker 6. Type 2 diabetes 7. Hyperlipidemia.  8. H/o NSVT 9. 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  10. RLE DVT: found in 9/19.   Current Outpatient Medications  Medication Sig Dispense Refill  . aspirin EC 81 MG tablet Take 81 mg by mouth daily.    Marland Kitchen atorvastatin (LIPITOR) 20 MG tablet Take 20 mg by mouth daily.     . carbamide peroxide (DEBROX) 6.5 % otic solution Place 5 drops into both ears 3 (three) times daily. 5-10 drops    . clotrimazole (LOTRIMIN) 1 % external solution Apply 1 application topically daily.    . cyanocobalamin 500 MCG tablet Take 500 mcg by mouth daily.    . DULoxetine (CYMBALTA) 60 MG capsule  Take 60 mg by mouth daily.    . fluticasone (FLONASE) 50 MCG/ACT nasal spray Place 1 spray into both nostrils 2 (two) times daily as needed for allergies.     Marland Kitchen gabapentin (NEURONTIN) 300 MG capsule Take 600 mg by mouth 3 (three) times daily.    . hydrOXYzine (VISTARIL) 25 MG capsule Take 25 mg by mouth 3 (three) times daily as needed for anxiety.    . Ipratropium-Albuterol (COMBIVENT) 20-100 MCG/ACT AERS respimat Inhale 1 puff into the lungs every 6 (six) hours.    . metFORMIN (GLUCOPHAGE-XR) 500 MG 24 hr tablet Take 500 mg by mouth 2 (two) times daily.    . methocarbamol (ROBAXIN) 750 MG tablet Take 750 mg by mouth 3 (three) times daily as needed for muscle spasms.     . nicotine (NICODERM CQ - DOSED IN MG/24 HR) 7 mg/24hr patch Place 1 patch (7 mg total) onto the skin daily as needed (tobacco craving). 28 patch 0  . omeprazole (PRILOSEC) 20 MG capsule Take 20 mg by mouth daily.    Marland Kitchen oxyCODONE (OXY IR/ROXICODONE) 5  MG immediate release tablet Take 1 tablet (5 mg total) by mouth every 8 (eight) hours as needed for severe pain. 30 tablet 0  . polyethylene glycol (MIRALAX / GLYCOLAX) packet Take 17 g by mouth daily as needed for mild constipation.    . sacubitril-valsartan (ENTRESTO) 49-51 MG Take 1 tablet by mouth 2 (two) times daily. 60 tablet 6  . sildenafil (VIAGRA) 100 MG tablet Take 50 mg by mouth daily as needed for erectile dysfunction.    Marland Kitchen spironolactone (ALDACTONE) 25 MG tablet Take 0.5 tablets (12.5 mg total) by mouth daily. 30 tablet 6  . thiamine 100 MG tablet Take 100 mg by mouth daily.    . traMADol (ULTRAM) 50 MG tablet Take 1 tablet (50 mg total) by mouth every 4 (four) hours as needed for moderate pain. 30 tablet 0  . warfarin (COUMADIN) 5 MG tablet Take 1 tablet (5 mg) daily except 1 and 1/2 tablets (7.5 mg ) on Monday and Friday    . docusate sodium (COLACE) 100 MG capsule Take 2 capsules (200 mg total) by mouth daily as needed for mild constipation. (Patient not taking: Reported on 09/09/2018) 10 capsule 0  . furosemide (LASIX) 40 MG tablet Take 0.5 tablets (20 mg total) by mouth as needed. (Patient not taking: Reported on 09/09/2018) 60 tablet 6   No current facility-administered medications for this encounter.     Patient has no known allergies.  REVIEW OF SYSTEMS: All systems negative except as listed in HPI, PMH and Problem list.   LVAD INTERROGATION:  Please see LVAD nurse's note above for details.    I reviewed the LVAD parameters from today, and compared the results to the patient's prior recorded data.  No programming changes were made.  The LVAD is functioning within specified parameters.  The patient performs LVAD self-test daily.  LVAD interrogation was negative for any significant power changes, alarms or PI events/speed drops.  LVAD equipment check completed and is in good working order.  Back-up equipment present.   LVAD education done on emergency procedures and  precautions and reviewed exit site care.    Vitals:   09/23/18 1032 09/23/18 1038  BP: (!) 100/0 129/71  Pulse: 85   SpO2: 99%   Weight: 71.8 kg (158 lb 6.4 oz)   MAP 89  Physical Exam: General: Well appearing this am. NAD.  HEENT: Normal. Neck: Supple, JVP  7-8 cm. Carotids OK.  Cardiac:  Mechanical heart sounds with LVAD hum present.  Lungs:  CTAB, normal effort.  Abdomen:  NT, ND, no HSM. No bruits or masses. +BS  LVAD exit site: Well-healed and incorporated. Dressing dry and intact. No erythema or drainage. Stabilization device present and accurately applied. Driveline dressing changed daily per sterile technique. Extremities:  Warm and dry. No cyanosis, clubbing, rash, or edema.  Neuro:  Alert & oriented x 3. Cranial nerves grossly intact. Moves all 4 extremities w/o difficulty. Affect pleasant        ASSESSMENT AND PLAN:   1. 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  LVAD parameters stable except for frequent PI events (no low flows).  No orthostatic symptoms, NYHA class II.   He does not look volume overloaded. MAP controlled.  - Continue to avoid Lasix.  With frequent PIs, will allow him to liberalize sodium and fluid intake a bit.     - Continue spironolactone 12.5 mg daily.  - Increase Entresto to 49/51 bid with elevated MAP   - Will be starting cardiac rehab.   - Continue ASA 81 daily.  - Continue warfarin for INR 2-2.5.  No evidence for GI bleeding. CBC today.  - Should be transplant candidate eventually if he can stay off cigarettes.   - Send BMET and LDH today.  2. Smoking: He has quit, using nicotine patches.  3. RLE DVT: On warfarin for LVAD.  4. OSA: Continue CPAP.  5. Hyperlipidemia: Atorvastatin.  6. Type II diabetes: Metformin, per PCP.   Loralie Champagne 09/24/2018 10:56 AM

## 2018-10-06 ENCOUNTER — Other Ambulatory Visit (HOSPITAL_COMMUNITY): Payer: Self-pay | Admitting: *Deleted

## 2018-10-06 DIAGNOSIS — Z7901 Long term (current) use of anticoagulants: Secondary | ICD-10-CM

## 2018-10-06 DIAGNOSIS — Z95811 Presence of heart assist device: Secondary | ICD-10-CM

## 2018-10-07 ENCOUNTER — Ambulatory Visit (HOSPITAL_COMMUNITY): Payer: Self-pay | Admitting: Pharmacist

## 2018-10-07 ENCOUNTER — Other Ambulatory Visit (HOSPITAL_COMMUNITY): Payer: Self-pay | Admitting: *Deleted

## 2018-10-07 ENCOUNTER — Ambulatory Visit (HOSPITAL_COMMUNITY)
Admission: RE | Admit: 2018-10-07 | Discharge: 2018-10-07 | Disposition: A | Discharge status: Home / Self Care | Payer: Medicare Other | Source: Ambulatory Visit | Attending: Internal Medicine | Admitting: Internal Medicine

## 2018-10-07 DIAGNOSIS — Z95811 Presence of heart assist device: Secondary | ICD-10-CM

## 2018-10-07 DIAGNOSIS — E785 Hyperlipidemia, unspecified: Secondary | ICD-10-CM | POA: Diagnosis not present

## 2018-10-07 DIAGNOSIS — I82401 Acute embolism and thrombosis of unspecified deep veins of right lower extremity: Secondary | ICD-10-CM | POA: Diagnosis not present

## 2018-10-07 DIAGNOSIS — I10 Essential (primary) hypertension: Secondary | ICD-10-CM

## 2018-10-07 DIAGNOSIS — G4733 Obstructive sleep apnea (adult) (pediatric): Secondary | ICD-10-CM | POA: Diagnosis not present

## 2018-10-07 DIAGNOSIS — Z7951 Long term (current) use of inhaled steroids: Secondary | ICD-10-CM | POA: Insufficient documentation

## 2018-10-07 DIAGNOSIS — Z7982 Long term (current) use of aspirin: Secondary | ICD-10-CM | POA: Insufficient documentation

## 2018-10-07 DIAGNOSIS — Z79899 Other long term (current) drug therapy: Secondary | ICD-10-CM | POA: Diagnosis not present

## 2018-10-07 DIAGNOSIS — K219 Gastro-esophageal reflux disease without esophagitis: Secondary | ICD-10-CM | POA: Insufficient documentation

## 2018-10-07 DIAGNOSIS — Z7901 Long term (current) use of anticoagulants: Secondary | ICD-10-CM

## 2018-10-07 DIAGNOSIS — K7030 Alcoholic cirrhosis of liver without ascites: Secondary | ICD-10-CM | POA: Insufficient documentation

## 2018-10-07 DIAGNOSIS — I5022 Chronic systolic (congestive) heart failure: Secondary | ICD-10-CM

## 2018-10-07 DIAGNOSIS — I420 Dilated cardiomyopathy: Secondary | ICD-10-CM | POA: Insufficient documentation

## 2018-10-07 DIAGNOSIS — F17210 Nicotine dependence, cigarettes, uncomplicated: Secondary | ICD-10-CM | POA: Diagnosis not present

## 2018-10-07 DIAGNOSIS — E119 Type 2 diabetes mellitus without complications: Secondary | ICD-10-CM | POA: Diagnosis not present

## 2018-10-07 DIAGNOSIS — Z7984 Long term (current) use of oral hypoglycemic drugs: Secondary | ICD-10-CM | POA: Insufficient documentation

## 2018-10-07 LAB — CBC
HCT: 34.2 % — ABNORMAL LOW (ref 39.0–52.0)
Hemoglobin: 10 g/dL — ABNORMAL LOW (ref 13.0–17.0)
MCH: 25.1 pg — AB (ref 26.0–34.0)
MCHC: 29.2 g/dL — AB (ref 30.0–36.0)
MCV: 85.7 fL (ref 80.0–100.0)
PLATELETS: 207 10*3/uL (ref 150–400)
RBC: 3.99 MIL/uL — AB (ref 4.22–5.81)
RDW: 14.6 % (ref 11.5–15.5)
WBC: 8.6 10*3/uL (ref 4.0–10.5)
nRBC: 0 % (ref 0.0–0.2)

## 2018-10-07 LAB — PROTIME-INR
INR: 1.88
PROTHROMBIN TIME: 21.4 s — AB (ref 11.4–15.2)

## 2018-10-07 LAB — BASIC METABOLIC PANEL
Anion gap: 7 (ref 5–15)
BUN: 7 mg/dL (ref 6–20)
CO2: 25 mmol/L (ref 22–32)
CREATININE: 0.86 mg/dL (ref 0.61–1.24)
Calcium: 9.3 mg/dL (ref 8.9–10.3)
Chloride: 109 mmol/L (ref 98–111)
GFR calc non Af Amer: >60 mL/min (ref >60)
Glucose, Bld: 107 mg/dL — ABNORMAL HIGH (ref 70–99)
Potassium: 4.1 mmol/L (ref 3.5–5.1)
Sodium: 141 mmol/L (ref 135–145)

## 2018-10-07 LAB — LACTATE DEHYDROGENASE: LDH: 194 U/L — AB (ref 98–192)

## 2018-10-07 MED ORDER — SACUBITRIL-VALSARTAN 97-103 MG PO TABS: 1.0000 | ORAL_TABLET | Freq: Two times a day (BID) | ORAL | 6 refills | Status: DC

## 2018-10-07 NOTE — Progress Notes (Addendum)
Patient presents for 2 week follow up in VAD Clinic today with significant other [name]. Reports no problems with VAD equipment or concerns with drive line. No episodes of dizziness or falling.   He is still wearing his Nicoderm patch, but says he has "smoked a few" cigarettes.   He reports walking 1 mile once a week on the treadmill at the [facility].    Provided with new patient vest to try.   Vital Signs:  Doppler Pressure: 120  Automatc BP: 131/82 (106)  138/90 (107) HR: 94 SPO2: 100%  Weight: 166.2lb w/o eqt Last weight: 159.2 lb Home weights: 152-163 lbs   VAD Indication: Destination Therapy    VAD interrogation & Equipment Management: Speed: 5700 Flow: 3.9 Power:  4.4w    PI: 7.5 Hct: 34  Alarms: no clinical alarms Events: 25-30 daily (2 suction events daily)  Fixed speed: 5700 Low speed limit: 5400  Primary Controller:  Replace back up battery in 29 months. Back up controller:   Replace back up battery in 29 months.  Annual Equipment Maintenance on UBC/PM was performed on 07/2018.   I reviewed the LVAD parameters from today and compared the results to the patient's prior recorded data. LVAD interrogation was NEGATIVE for significant power changes, NEGATIVE for clinical alarms and STABLE for PI events/speed drops. No programming changes were made and pump is functioning within specified parameters. Pt is performing daily controller and system monitor self tests along with completing weekly and monthly maintenance for LVAD equipment.  LVAD equipment check completed and is in good working order. Back-up equipment present.   Exit Site Care: Drive line is being maintained weekly by [name].  Existing VAD dressing removed and site care performed using sterile technique. Drive line exit site cleaned with Chlora prep applicators x 2, allowed to dry, and rinsed with saline. Sorbaview dressing with biopatch re-applied. Exit site healing and unincorporated, the velour is  fully implanted at exit site. Raised, itchy patches underneath dressing and anchor noted. Did not use skin prep. No redness, tenderness, drainage, or foul odor noted. Drive line anchor re-applied. Pt denies fever or chills.  Instructed [name] to wipe skin with saline wipe after Chlora prep applicator, and to no longer use skin prep swab. Provided with 4 weekly kits, 4 anchors, and sterile saline wipes.  BP & Labs:  MAP 120  - Doppler is reflecting modified systolic.  Hgb 10.0 - No S/S of bleeding. Specifically denies melena/BRBPR or nosebleeds.  LDH stable at 194 with established baseline of 175 - 255. Denies tea-colored urine. No power elevations noted on interrogation.    Plan: 1. Increase Entresto 97/103 BID 2. Return to clinic on Wednesday 11/27 1030 for labs & BP check.  3. Return to VAD clinic in 3 weeks for follow up.  Alyce Pagan RN VAD Coordinator  Office: [phone]  24/7 Pager: [phone]   OZD:GUYQIH, Lucita Lora, MD  Cardiology: Dr. Shirlee Latch  57 yo with history of nonischemic cardiomyopathy, RLE DVT, cirrhosis, smoking, and OSA returns for followup of CHF/LVAD placement.  Cardiomyopathy was diagnosed in 6/19 in [place], [place] at that time showed low output.  He was admitted to [facility] in 9/19 with low output HF and was started on milrinone and diuresed.  Unable to wean off milrinone.  He had a degree of RV failure, but this improved on milrinone.  Valvular heart disease also looked better with milrinone and diuresis.  On 08/03/18, he had Heartmate 3 LVAD placed.  Speed was optimized by ramp echo  post-op.  Post-op course was relatively unremarkable.    He is doing well.  Walking 1 mile/day at the [facility].  No exertional dyspnea.  No lightheadedness.  No BRBPR/melena.  MAP remains elevated. He is not using any Lasix.  Still with frequent PI events (around 25/day).    Labs (9/19): LDH 210, INR 2.17, WBCs 17.1 => 15, hgb 9.7, creatinine 0.72 Labs (10/19): K 4.3, creatinine  0.78 => 0.83, hgb 10.2 Labs (11/19): creatinine 0.79, hgb 10  Denies LVAD alarms.  Denies driveline trauma, erythema or drainage.  Denies ICD shocks.   Reports taking Coumadin as prescribed and adherence to anticoagulation based dietary restrictions.  Denies bright red blood per rectum or melena, no dark urine or hematuria.    PMH: 1. Degenerative disc disease.  2. GERD 3. Chronic systolic CHF:  Nonischemic cardiomyopathy.  Dilated cardiomyopathy diagnosed 6/19 in [place].  LHC/RHC in 7/19 showed elevated filling pressures, low cardiac output, and no significant CAD.   - Echo (9/19): Severe LV dilation with EF 10-20%, moderate-severe MR, severely dilated RV with mildly decreased systolic function, severe TR.  - Cardiac MRI (9/19): EF 14%, moderate dilated LV, severely dilated RV with mod-severe systolic function and EF 25%, nonspecific RV insertion site LGE.  - RHC (5/19) on milrinone 0.375: mean RA 8, PA 40/19, mean PCWP 12, PAPi 2.65, CI 2.71.  - Echo (9/19, on milrinone and diuresed): EF 20-25% with moderate LV dilation, moderately dilated RV with mildly decreased systolic function, mild-moderate MR, moderate TR, PASP 51 mmHg.  Cannot rule out noncompaction.  - Heartmate 3 LVAD placement in 9/19.  4. OSA: CPAP use.  5. Prior smoker 6. Type 2 diabetes 7. Hyperlipidemia.  8. H/o NSVT 9. 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  10. RLE DVT: found in 9/19.   Current Outpatient Medications  Medication Sig Dispense Refill  . aspirin EC 81 MG tablet Take 81 mg by mouth daily.    Marland Kitchen atorvastatin (LIPITOR) 20 MG tablet Take 20 mg by mouth daily.     . carbamide peroxide (DEBROX) 6.5 % otic solution Place 5 drops into both ears 3 (three) times daily. 5-10 drops    . clotrimazole (LOTRIMIN) 1 % external solution Apply 1 application topically daily.    . cyanocobalamin 500 MCG tablet Take 500 mcg by mouth daily.    Marland Kitchen docusate sodium (COLACE) 100 MG capsule Take 2  capsules (200 mg total) by mouth daily as needed for mild constipation. 10 capsule 0  . DULoxetine (CYMBALTA) 60 MG capsule Take 60 mg by mouth daily.    . fluticasone (FLONASE) 50 MCG/ACT nasal spray Place 1 spray into both nostrils 2 (two) times daily as needed for allergies.     Marland Kitchen gabapentin (NEURONTIN) 300 MG capsule Take 600 mg by mouth 3 (three) times daily.    . hydrOXYzine (VISTARIL) 25 MG capsule Take 25 mg by mouth 3 (three) times daily as needed for anxiety.    . Ipratropium-Albuterol (COMBIVENT) 20-100 MCG/ACT AERS respimat Inhale 1 puff into the lungs every 6 (six) hours.    . metFORMIN (GLUCOPHAGE-XR) 500 MG 24 hr tablet Take 500 mg by mouth 2 (two) times daily.    . methocarbamol (ROBAXIN) 750 MG tablet Take 750 mg by mouth 3 (three) times daily as needed for muscle spasms.     . nicotine (NICODERM CQ - DOSED IN MG/24 HR) 7 mg/24hr patch Place 1 patch (7 mg total) onto the skin daily as needed (tobacco craving).  28 patch 0  . omeprazole (PRILOSEC) 20 MG capsule Take 20 mg by mouth daily.    Marland Kitchen oxyCODONE (OXY IR/ROXICODONE) 5 MG immediate release tablet Take 1 tablet (5 mg total) by mouth every 8 (eight) hours as needed for severe pain. 30 tablet 0  . polyethylene glycol (MIRALAX / GLYCOLAX) packet Take 17 g by mouth daily as needed for mild constipation.    . sildenafil (VIAGRA) 100 MG tablet Take 50 mg by mouth daily as needed for erectile dysfunction.    Marland Kitchen spironolactone (ALDACTONE) 25 MG tablet Take 0.5 tablets (12.5 mg total) by mouth daily. 30 tablet 6  . thiamine 100 MG tablet Take 100 mg by mouth daily.    . traMADol (ULTRAM) 50 MG tablet Take 1 tablet (50 mg total) by mouth every 4 (four) hours as needed for moderate pain. 30 tablet 0  . warfarin (COUMADIN) 5 MG tablet Take 1 tablet (5 mg) daily except 1 and 1/2 tablets (7.5 mg ) on Monday, Wednesday and Friday    . furosemide (LASIX) 40 MG tablet Take 0.5 tablets (20 mg total) by mouth as needed. (Patient not taking: Reported  on 09/09/2018) 60 tablet 6  . sacubitril-valsartan (ENTRESTO) 97-103 MG Take 1 tablet by mouth 2 (two) times daily. 60 tablet 6   No current facility-administered medications for this encounter.     Patient has no known allergies.  REVIEW OF SYSTEMS: All systems negative except as listed in HPI, PMH and Problem list.   LVAD INTERROGATION:  Please see LVAD nurse's note above for details.    I reviewed the LVAD parameters from today, and compared the results to the patient's prior recorded data.  No programming changes were made.  The LVAD is functioning within specified parameters.  The patient performs LVAD self-test daily.  LVAD interrogation was negative for any significant power changes, alarms or PI events/speed drops.  LVAD equipment check completed and is in good working order.  Back-up equipment present.   LVAD education done on emergency procedures and precautions and reviewed exit site care.    Vitals:   10/07/18 1022 10/07/18 1034 10/07/18 1040  BP: (!) 120/0 131/82 138/90  Pulse: 94    SpO2:  100%   Weight: 75.4 kg (166 lb 3.2 oz)    MAP 107  Physical Exam: General: Well appearing this am. NAD.  HEENT: Normal. Neck: Supple, JVP 7-8 cm. Carotids OK.  Cardiac:  Mechanical heart sounds with LVAD hum present.  Lungs:  CTAB, normal effort.  Abdomen:  NT, ND, no HSM. No bruits or masses. +BS  LVAD exit site: Well-healed and incorporated. Dressing dry and intact. No erythema or drainage. Stabilization device present and accurately applied. Driveline dressing changed daily per sterile technique. Extremities:  Warm and dry. No cyanosis, clubbing, rash, or edema.  Neuro:  Alert & oriented x 3. Cranial nerves grossly intact. Moves all 4 extremities w/o difficulty. Affect pleasant      ASSESSMENT AND PLAN:   1. 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  LVAD parameters stable except for frequent PI events (no low flows).  No orthostatic  symptoms, NYHA class II.   He does not look volume overloaded. MAP remains high.  - He does not need Lasix.      - Continue spironolactone 12.5 mg daily.  - Increase Entresto to 97/103 bid with elevated MAP.  He will return next week for BP check.  - Will be starting cardiac rehab  in January.   - Continue ASA 81 daily.  - Continue warfarin for INR 2-2.5.  No evidence for GI bleeding. CBC today.  - Should be transplant candidate eventually if he can stay off cigarettes, still smoking a few a day but using nicotine patches.   - Pending BMET and LDH today.  2. Smoking: Still smoking a few cigs/day. Using nicotine patches.  Knows he needs to quit to be a transplant candidate.  3. RLE DVT: On warfarin for LVAD.  4. OSA: Continue CPAP.  5. Hyperlipidemia: Atorvastatin.  6. Type II diabetes: Metformin, per PCP.   Marca Ancona 10/07/2018 10:53 PM

## 2018-10-07 NOTE — Addendum Note (Signed)
Encounter addended by: Laurey Morale, MD on: 10/07/2018 11:07 PM  Actions taken: LOS modified

## 2018-10-14 ENCOUNTER — Ambulatory Visit (HOSPITAL_COMMUNITY): Payer: Self-pay | Admitting: Pharmacist

## 2018-10-14 ENCOUNTER — Encounter (HOSPITAL_COMMUNITY): Payer: Self-pay

## 2018-10-14 ENCOUNTER — Ambulatory Visit (HOSPITAL_COMMUNITY)
Admission: RE | Admit: 2018-10-14 | Discharge: 2018-10-14 | Disposition: A | Discharge status: Home / Self Care | Payer: Medicare Other | Source: Ambulatory Visit | Attending: Internal Medicine | Admitting: Internal Medicine

## 2018-10-14 DIAGNOSIS — Z95811 Presence of heart assist device: Secondary | ICD-10-CM | POA: Diagnosis present

## 2018-10-14 DIAGNOSIS — Z7901 Long term (current) use of anticoagulants: Secondary | ICD-10-CM

## 2018-10-14 LAB — PROTIME-INR
INR: 2.53
PROTHROMBIN TIME: 26.9 s — AB (ref 11.4–15.2)

## 2018-10-14 LAB — BASIC METABOLIC PANEL
Anion gap: 7 (ref 5–15)
BUN: 6 mg/dL (ref 6–20)
CALCIUM: 9 mg/dL (ref 8.9–10.3)
CHLORIDE: 107 mmol/L (ref 98–111)
CO2: 25 mmol/L (ref 22–32)
CREATININE: 0.83 mg/dL (ref 0.61–1.24)
GFR calc non Af Amer: >60 mL/min (ref >60)
Glucose, Bld: 109 mg/dL — ABNORMAL HIGH (ref 70–99)
Potassium: 4.2 mmol/L (ref 3.5–5.1)
SODIUM: 139 mmol/L (ref 135–145)

## 2018-10-14 MED ORDER — AMLODIPINE BESYLATE 5 MG PO TABS: 5.0000 mg | ORAL_TABLET | Freq: Every day | ORAL | 6 refills | Status: DC

## 2018-10-14 MED ORDER — SPIRONOLACTONE 25 MG PO TABS: 25.0000 mg | ORAL_TABLET | Freq: Every day | ORAL | 6 refills | Status: DC

## 2018-10-14 NOTE — Addendum Note (Signed)
Encounter addended by: Levonne Spiller, RN on: 10/14/2018 11:41 AM  Actions taken: Vitals modified, Sign clinical note

## 2018-10-14 NOTE — Patient Instructions (Addendum)
1. Increase spiro (Aldactone) to 25 mg daily. 2. Start amlodipine (Norvasc) 5 mg daily. 3. Return for scheduled VAD clinic visit.  4. Cicero Duck will call you with coumadin dosing instructions.

## 2018-10-14 NOTE — Progress Notes (Signed)
Patient here for BP check; Entresto increased last clinic visit to 97/103 bid.  Pt denies any dizziness or lightheadedness. He is not requiring prn Lasix.   He is c/o itching under sorbaview dressing - see below.  BP remains elevated today - reviewed with Dr. Shirlee Latch. Increased his aldactone and added amlodipine.   Vital Signs:  Doppler Pressure: 120  Automatc BP: 127/86 (102) HR: 83 SPO2: 97%  Weight: 165.2 lb w/o eqt Last weight: 166.2 lb   Exit Site Care: Drive line is being maintained weekly by [name].  Existing VAD dressing removed and site care performed using sterile technique. Drive line exit site cleaned with sterile saline then with betadine swabs; outer area rinsed with sterile saline; allowed to dry and Sorbaview dressing with biopatch re-applied. Exit site icorporated, the velour is fully implanted at exit site. Raised, itchy patches underneath dressing and anchor noted. Will continue to leave off skin prep and switch to betadine swabs for cleansing. Betadine swabs supplied to pt/caregiver. Exit site with no redness, tenderness, drainage, or foul odor noted. Drive line anchor re-applied. Pt denies fever or chills  Plan: 1. Increase spiro (Aldactone) to 25 mg daily. 2. Start amlodipine (Norvasc) 5 mg daily. 3. Return for scheduled VAD clinic visit.  4. Cicero Duck will call you with coumadin dosing instructions.   Hessie Diener RN VAD Coordinator  Office: [phone]  24/7 Pager: [phone]

## 2018-10-14 NOTE — Addendum Note (Signed)
Encounter addended by: Levonne Spiller, RN on: 10/14/2018 11:08 AM  Actions taken: Order Reconciliation Section accessed, Home Medications modified, Medication List reviewed, Medication taking status modified, Order list changed, Sign clinical note

## 2018-10-21 ENCOUNTER — Telehealth (HOSPITAL_COMMUNITY): Payer: Self-pay

## 2018-10-21 NOTE — Telephone Encounter (Signed)
Called pt to reschedule his orientation for CR, LMTCB.

## 2018-10-23 ENCOUNTER — Telehealth (HOSPITAL_COMMUNITY): Payer: Self-pay

## 2018-10-23 NOTE — Telephone Encounter (Signed)
Called and spoke to pt to reschedule for CR Rehab. Patient will come in for orientation on 11/17/18 @ 8AM and will attend the 815AM exercise class.

## 2018-10-27 ENCOUNTER — Encounter (HOSPITAL_COMMUNITY): Payer: Self-pay | Admitting: Unknown Physician Specialty

## 2018-10-27 ENCOUNTER — Other Ambulatory Visit (HOSPITAL_COMMUNITY): Payer: Self-pay | Admitting: *Deleted

## 2018-10-27 DIAGNOSIS — Z7901 Long term (current) use of anticoagulants: Secondary | ICD-10-CM

## 2018-10-27 DIAGNOSIS — Z95811 Presence of heart assist device: Secondary | ICD-10-CM

## 2018-10-28 ENCOUNTER — Ambulatory Visit (HOSPITAL_COMMUNITY): Payer: Self-pay | Admitting: Pharmacist

## 2018-10-28 ENCOUNTER — Encounter (HOSPITAL_COMMUNITY): Payer: Self-pay

## 2018-10-28 ENCOUNTER — Other Ambulatory Visit (HOSPITAL_COMMUNITY): Payer: Self-pay | Admitting: *Deleted

## 2018-10-28 ENCOUNTER — Ambulatory Visit (HOSPITAL_COMMUNITY)
Admission: RE | Admit: 2018-10-28 | Discharge: 2018-10-28 | Disposition: A | Discharge status: Home / Self Care | Payer: Medicare Other | Source: Ambulatory Visit | Attending: Internal Medicine | Admitting: Internal Medicine

## 2018-10-28 VITALS — BP 105/55 | HR 97 | Ht 70.0 in | Wt 165.2 lb

## 2018-10-28 DIAGNOSIS — K219 Gastro-esophageal reflux disease without esophagitis: Secondary | ICD-10-CM | POA: Diagnosis not present

## 2018-10-28 DIAGNOSIS — Z7901 Long term (current) use of anticoagulants: Secondary | ICD-10-CM

## 2018-10-28 DIAGNOSIS — B369 Superficial mycosis, unspecified: Secondary | ICD-10-CM

## 2018-10-28 DIAGNOSIS — Z720 Tobacco use: Secondary | ICD-10-CM

## 2018-10-28 DIAGNOSIS — Z7982 Long term (current) use of aspirin: Secondary | ICD-10-CM | POA: Insufficient documentation

## 2018-10-28 DIAGNOSIS — Z9989 Dependence on other enabling machines and devices: Secondary | ICD-10-CM | POA: Insufficient documentation

## 2018-10-28 DIAGNOSIS — I10 Essential (primary) hypertension: Secondary | ICD-10-CM

## 2018-10-28 DIAGNOSIS — I472 Ventricular tachycardia: Secondary | ICD-10-CM | POA: Diagnosis not present

## 2018-10-28 DIAGNOSIS — Z95811 Presence of heart assist device: Secondary | ICD-10-CM

## 2018-10-28 DIAGNOSIS — Z79899 Other long term (current) drug therapy: Secondary | ICD-10-CM | POA: Insufficient documentation

## 2018-10-28 DIAGNOSIS — I110 Hypertensive heart disease with heart failure: Secondary | ICD-10-CM | POA: Insufficient documentation

## 2018-10-28 DIAGNOSIS — R21 Rash and other nonspecific skin eruption: Secondary | ICD-10-CM | POA: Insufficient documentation

## 2018-10-28 DIAGNOSIS — I420 Dilated cardiomyopathy: Secondary | ICD-10-CM | POA: Insufficient documentation

## 2018-10-28 DIAGNOSIS — E119 Type 2 diabetes mellitus without complications: Secondary | ICD-10-CM | POA: Diagnosis not present

## 2018-10-28 DIAGNOSIS — Z5181 Encounter for therapeutic drug level monitoring: Secondary | ICD-10-CM

## 2018-10-28 DIAGNOSIS — E785 Hyperlipidemia, unspecified: Secondary | ICD-10-CM | POA: Insufficient documentation

## 2018-10-28 DIAGNOSIS — F17210 Nicotine dependence, cigarettes, uncomplicated: Secondary | ICD-10-CM | POA: Insufficient documentation

## 2018-10-28 DIAGNOSIS — G4733 Obstructive sleep apnea (adult) (pediatric): Secondary | ICD-10-CM | POA: Diagnosis not present

## 2018-10-28 DIAGNOSIS — Z7984 Long term (current) use of oral hypoglycemic drugs: Secondary | ICD-10-CM | POA: Diagnosis not present

## 2018-10-28 DIAGNOSIS — I5022 Chronic systolic (congestive) heart failure: Secondary | ICD-10-CM

## 2018-10-28 DIAGNOSIS — K7030 Alcoholic cirrhosis of liver without ascites: Secondary | ICD-10-CM | POA: Diagnosis not present

## 2018-10-28 DIAGNOSIS — Z86718 Personal history of other venous thrombosis and embolism: Secondary | ICD-10-CM | POA: Diagnosis not present

## 2018-10-28 LAB — COMPREHENSIVE METABOLIC PANEL
ALBUMIN: 3.8 g/dL (ref 3.5–5.0)
ALK PHOS: 149 U/L — AB (ref 38–126)
ALT: 12 U/L (ref 0–44)
AST: 22 U/L (ref 15–41)
Anion gap: 8 (ref 5–15)
BUN: 11 mg/dL (ref 6–20)
CALCIUM: 9.3 mg/dL (ref 8.9–10.3)
CHLORIDE: 107 mmol/L (ref 98–111)
CO2: 24 mmol/L (ref 22–32)
Creatinine, Ser: 0.76 mg/dL (ref 0.61–1.24)
GFR calc Af Amer: >60 mL/min (ref >60)
Glucose, Bld: 100 mg/dL — ABNORMAL HIGH (ref 70–99)
POTASSIUM: 4.6 mmol/L (ref 3.5–5.1)
SODIUM: 139 mmol/L (ref 135–145)
TOTAL PROTEIN: 7.1 g/dL (ref 6.5–8.1)
Total Bilirubin: 0.6 mg/dL (ref 0.3–1.2)

## 2018-10-28 LAB — CBC
HCT: 39.4 % (ref 39.0–52.0)
Hemoglobin: 11.6 g/dL — ABNORMAL LOW (ref 13.0–17.0)
MCH: 24.5 pg — ABNORMAL LOW (ref 26.0–34.0)
MCHC: 29.4 g/dL — ABNORMAL LOW (ref 30.0–36.0)
MCV: 83.3 fL (ref 80.0–100.0)
Platelets: 189 10*3/uL (ref 150–400)
RBC: 4.73 MIL/uL (ref 4.22–5.81)
RDW: 15.2 % (ref 11.5–15.5)
WBC: 7.3 10*3/uL (ref 4.0–10.5)
nRBC: 0 % (ref 0.0–0.2)

## 2018-10-28 LAB — PROTIME-INR
INR: 1.37
Prothrombin Time: 16.7 seconds — ABNORMAL HIGH (ref 11.4–15.2)

## 2018-10-28 LAB — LACTATE DEHYDROGENASE: LDH: 192 U/L (ref 98–192)

## 2018-10-28 LAB — PREALBUMIN: PREALBUMIN: 25.2 mg/dL (ref 18–38)

## 2018-10-28 MED ORDER — NYSTATIN 100000 UNIT/GM EX POWD: CUTANEOUS | 3 refills | Status: DC

## 2018-10-28 MED ORDER — WARFARIN SODIUM 5 MG PO TABS: ORAL_TABLET | ORAL | 3 refills | Status: DC

## 2018-10-28 MED ORDER — ENOXAPARIN SODIUM 80 MG/0.8ML ~~LOC~~ SOLN: 80.0000 mg | Freq: Two times a day (BID) | SUBCUTANEOUS | 3 refills | Status: DC

## 2018-10-28 MED ORDER — SACUBITRIL-VALSARTAN 97-103 MG PO TABS: 1.0000 | ORAL_TABLET | Freq: Two times a day (BID) | ORAL | 3 refills | Status: DC

## 2018-10-28 NOTE — Progress Notes (Signed)
Patient presents for 3 week follow up in VAD Clinic today with significant other [name]. Reports no problems with VAD equipment or concerns with drive line. No episodes of dizziness or falling.   He reports walking 1 mile once a week on the treadmill at the [facility].    Pt confirms he increased his Cleda Daub to 25 mg daily and started Norvasc 5 mg daily as instructed   Pt asking about beach trip week of Christmas. Will send him letter with nearest VAD center and 24/7 emergency contact information.   Nickolas Madrid, PharmD in and spoke with patient about subtherapeutic INR. Instructions given on how and why he needs to use Lovenox injections until INR higher. Pt will check INR Friday.   Vital Signs:  Doppler Pressure:  90  Automatc BP:  105/55 (89) HR: 97 SPO2: 99%  Weight: 165.2 lb w/o eqt Last weight: 166.2 lb Home weights: 157 - 164 lbs   VAD Indication: Destination Therapy due to smoking status  VAD interrogation & Equipment Management: Speed: 5700 Flow: 4.7 Power:  4.7w PI: 4.4 Hct: 34  Alarms: no clinical alarms Events:  30 - 60 PI events  Fixed speed: 5700 Low speed limit: 5400  Primary Controller: Replace back up battery in 28 months. Back up controller: Replace back up battery in 28months.  Annual Equipment Maintenance on UBC/PM was performed on 07/2018.  I reviewed the LVAD parameters from todayand compared the results to the patient's prior recorded data.LVAD interrogation was NEGATIVEfor significant power changes, NEGATIVEfor clinicalalarms and STABLEfor PI events/speed drops. No programming changes were madeand pump is functioning within specified parameters. Pt is performing daily controller and system monitor self tests along with completing weekly and monthly maintenance for LVAD equipment.  LVAD equipment check completed and is in good working order. Back-up equipment present.   Exit Site Care: Drive line is being maintained  [employer]. She reports she "ran out of weekly dressing kits" and used gauze dressing with silver; last changed Sunday 10/25/18. Wife reports his site looks better after rinsing area with sterile saline after cleansing with betadine swabs. Wife asked Katrina Stack, PA to look at site. Existing VAD dressing removed and site care performed using sterile technique.  Increased area of skin irritation noted. Tonye Becket, NP in to assess site. Drive line exit site cleaned with sterile saline only; allowed to dryandgauze dressing with silver strip re-applied. Exit site icorporated, the velour is fully implanted at exit site. Raised, itchy patches underneath dressing and anchor noted. Exit site with small amount yellow drainage;  no redness, tenderness, or foul odor noted. Drive line anchor replaced x 2. Pt denies fever or chills. [name] will switch to daily dressing kits and change daily until pt returns Friday for INR and wound check.       BP &Labs: Doppler 90 and is reflective of MAP.   Hgb 11.6 - No S/S of bleeding. Specifically denies melena/BRBPR or nosebleeds.  LDH stable at 192with established baseline of 175 - 255. Denies tea-colored urine. No power elevations noted on interrogation.   3 month Intermacs follow up completed including: Quality of Life, KCCQ-12, and Neurocognitive trail making.   Pt completed 1400 feet during 6 minute walk.   Patient Instructions: 1. Change VAD dressing daily until re-check Friday. Apply Nystatin powder on skin rash (except avoid right around drive line exit site) and dab with skin prep to seal. Clean site gingerly with sterile saline only. Use gauze dressing kits, avoid Sorbaview dressing and Tegaderm  covering. Need to avoid any moisture.  2. Return on Friday for INR and wound check.  2. Return to VAD Clinic in 6 weeks.  Hessie Diener, RN VAD Coordinator  Office: [phone]  24/7 Pager: [phone]   GNF:AOZHYQ, Lucita Lora, MD    Cardiology: Dr. Shirlee Latch  57 yo with history of nonischemic cardiomyopathy, RLE DVT, cirrhosis, smoking, and OSA returns for followup of CHF/LVAD placement.  Cardiomyopathy was diagnosed in 6/19 in [place], [place] at that time showed low output.  He was admitted to [facility] in 9/19 with low output HF and was started on milrinone and diuresed.  Unable to wean off milrinone.  He had a degree of RV failure, but this improved on milrinone.  Valvular heart disease also looked better with milrinone and diuresis.  On 08/03/18, he had Heartmate 3 LVAD placed.  Speed was optimized by ramp echo post-op.  Post-op course was relatively unremarkable.    He returns today for VAD follow up. Feeling great. Last visit spiro was increased and amlodipine was started for elevated MAPs. MAP is much improved at 90 today. Denies any dizziness. No SOB, orthopnea, or edema. No hematuria or dark stools. No s/s infection. He does have a lot of irritation under driveline dressing. Continues to have frequent PIs 30-60 daily. He is going to [facility] over Christmas. INR low at 1.37 today. Taking all medications. Drinking 3L fluid/day.   Labs (9/19): LDH 210, INR 2.17, WBCs 17.1 => 15, hgb 9.7, creatinine 0.72 Labs (10/19): K 4.3, creatinine 0.78 => 0.83, hgb 10.2 Labs (11/19): creatinine 0.79, hgb 10  Denies LVAD alarms.  Denies driveline trauma, erythema or drainage.  Denies ICD shocks.   Reports taking Coumadin as prescribed and adherence to anticoagulation based dietary restrictions.  Denies bright red blood per rectum or melena, no dark urine or hematuria.    PMH: 1. Degenerative disc disease.  2. GERD 3. Chronic systolic CHF:  Nonischemic cardiomyopathy.  Dilated cardiomyopathy diagnosed 6/19 in [place].  LHC/RHC in 7/19 showed elevated filling pressures, low cardiac output, and no significant CAD.   - Echo (9/19): Severe LV dilation with EF 10-20%, moderate-severe MR, severely dilated RV with mildly decreased systolic function,  severe TR.  - Cardiac MRI (9/19): EF 14%, moderate dilated LV, severely dilated RV with mod-severe systolic function and EF 25%, nonspecific RV insertion site LGE.  - RHC (5/19) on milrinone 0.375: mean RA 8, PA 40/19, mean PCWP 12, PAPi 2.65, CI 2.71.  - Echo (9/19, on milrinone and diuresed): EF 20-25% with moderate LV dilation, moderately dilated RV with mildly decreased systolic function, mild-moderate MR, moderate TR, PASP 51 mmHg.  Cannot rule out noncompaction.  - Heartmate 3 LVAD placement in 9/19.  4. OSA: CPAP use.  5. Prior smoker 6. Type 2 diabetes 7. Hyperlipidemia.  8. H/o NSVT 9. 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  10. RLE DVT: found in 9/19.   Current Outpatient Medications  Medication Sig Dispense Refill  . amLODipine (NORVASC) 5 MG tablet Take 1 tablet (5 mg total) by mouth daily. 30 tablet 6  . aspirin EC 81 MG tablet Take 81 mg by mouth daily.    Marland Kitchen atorvastatin (LIPITOR) 20 MG tablet Take 20 mg by mouth daily.     . carbamide peroxide (DEBROX) 6.5 % otic solution Place 5 drops into both ears 3 (three) times daily. 5-10 drops    . clotrimazole (LOTRIMIN) 1 % external solution Apply 1 application topically daily.    . cyanocobalamin  500 MCG tablet Take 500 mcg by mouth daily.    Marland Kitchen docusate sodium (COLACE) 100 MG capsule Take 2 capsules (200 mg total) by mouth daily as needed for mild constipation. 10 capsule 0  . DULoxetine (CYMBALTA) 60 MG capsule Take 60 mg by mouth daily.    . fluticasone (FLONASE) 50 MCG/ACT nasal spray Place 1 spray into both nostrils 2 (two) times daily as needed for allergies.     Marland Kitchen gabapentin (NEURONTIN) 300 MG capsule Take 600 mg by mouth 3 (three) times daily.    . hydrOXYzine (VISTARIL) 25 MG capsule Take 25 mg by mouth 3 (three) times daily as needed for anxiety.    . Ipratropium-Albuterol (COMBIVENT) 20-100 MCG/ACT AERS respimat Inhale 1 puff into the lungs every 6 (six) hours.    . metFORMIN (GLUCOPHAGE-XR)  500 MG 24 hr tablet Take 500 mg by mouth 2 (two) times daily.    . methocarbamol (ROBAXIN) 750 MG tablet Take 750 mg by mouth 3 (three) times daily as needed for muscle spasms.     . nicotine (NICODERM CQ - DOSED IN MG/24 HR) 7 mg/24hr patch Place 1 patch (7 mg total) onto the skin daily as needed (tobacco craving). 28 patch 0  . omeprazole (PRILOSEC) 20 MG capsule Take 20 mg by mouth daily.    Marland Kitchen oxyCODONE (OXY IR/ROXICODONE) 5 MG immediate release tablet Take 1 tablet (5 mg total) by mouth every 8 (eight) hours as needed for severe pain. 30 tablet 0  . polyethylene glycol (MIRALAX / GLYCOLAX) packet Take 17 g by mouth daily as needed for mild constipation.    . sacubitril-valsartan (ENTRESTO) 97-103 MG Take 1 tablet by mouth 2 (two) times daily. 180 tablet 3  . sildenafil (VIAGRA) 100 MG tablet Take 50 mg by mouth daily as needed for erectile dysfunction.    Marland Kitchen spironolactone (ALDACTONE) 25 MG tablet Take 1 tablet (25 mg total) by mouth daily. 30 tablet 6  . thiamine 100 MG tablet Take 100 mg by mouth daily.    . traMADol (ULTRAM) 50 MG tablet Take 1 tablet (50 mg total) by mouth every 4 (four) hours as needed for moderate pain. 30 tablet 0  . warfarin (COUMADIN) 5 MG tablet Take 1 tablet (5 mg) daily except 1 and 1/2 tablets (7.5 mg ) on Monday, Wednesday and Friday or as directed 180 tablet 3  . furosemide (LASIX) 40 MG tablet Take 0.5 tablets (20 mg total) by mouth as needed. (Patient not taking: Reported on 10/14/2018) 60 tablet 6   No current facility-administered medications for this encounter.     Patient has no known allergies.  Review of systems complete and found to be negative unless listed in HPI.   LVAD INTERROGATION:  Please see LVAD nurse's note from above.   I reviewed the LVAD parameters from today, and compared the results to the patient's prior recorded data.  No programming changes were made.  The LVAD is functioning within specified parameters.  The patient performs LVAD  self-test daily.  LVAD interrogation was negative for any significant power changes, alarms or PI events/speed drops.  LVAD equipment check completed and is in good working order.  Back-up equipment present.   LVAD education done on emergency procedures and precautions and reviewed exit site care.    Vitals:   10/28/18 1024 10/28/18 1025  BP: (!) 90/0 (!) 105/55  Pulse:  97  SpO2:  99%  Weight:  74.9 kg (165 lb 3.2 oz)  Height:  5'  10" (1.778 m)  MAP 89  Physical Exam: General: Well appearing this am. NAD.  HEENT: Normal. Neck: Supple, JVP 5-6 cm. Carotids OK.  Cardiac:  Mechanical heart sounds with LVAD hum present.  Lungs:  CTAB, normal effort.  Abdomen:  NT, ND, no HSM. No bruits or masses. +BS  LVAD exit site: Rash noted around driveline. See VAD coordinator photo. Dressing dry and intact. No erythema or drainage. Stabilization device present and accurately applied.  Extremities:  Warm and dry. No cyanosis, clubbing, rash, or edema.  Neuro:  Alert & oriented x 3. Cranial nerves grossly intact. Moves all 4 extremities w/o difficulty. Affect pleasant     ASSESSMENT AND PLAN:   1. 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  LVAD parameters stable except for frequent PI events (no low flows).  No orthostatic symptoms, NYHA class II.  Volume stable. MAP improved today.  - He does not need Lasix.      - Continue spironolactone 25 mg daily.  - Continue Entresto to 97/103 bid  - Continue amlodipine 5 mg daily. MAP improved from 120 to 90  - Will be starting cardiac rehab in January.  Has orientation 12/31 - Continue ASA 81 daily.  - Continue warfarin for INR 2-2.5.  No evidence for GI bleeding. INR low today at 1.37. Start lovenox bridge 80 mg BID with hx of DVT per pharmacy. Recheck INR on Friday. - Should be transplant candidate eventually if he can stay off cigarettes, still smoking a few a day but using nicotine patches.   - Creatinine  stable 0.76. LDH stable at 192. 2. Smoking: Still smoking a few cigs/day. Using nicotine patches.  Knows he needs to quit to be a transplant candidate.  3. RLE DVT: On warfarin for LVAD. See above. 4. OSA: Continue CPAP. No change. 5. Hyperlipidemia: Atorvastatin.  6. Type II diabetes: Metformin, per PCP.  7. HTN - Much better control with amlodipine and spiro.  8. Rash - ?Fungal infection under driveline dressing - See photo. Patient is having a lot of irritation and itching under driveline dressing. Will start nystatin powder (not at exit site, but around). He can do daily dressing changes with saline only for now. Provided dressing supplies. Recheck site on Friday. - Continue hydroxyzine to help with itching.  INR and rash check on Friday. Follow up in 6 weeks for full visit.  Alford Highland 10/28/2018 11:22 AM

## 2018-10-28 NOTE — Patient Instructions (Signed)
1 

## 2018-10-28 NOTE — Progress Notes (Signed)
Patient presents for 3 week follow up in VAD Clinic today with significant other [name]. Reports no problems with VAD equipment or concerns with drive line. No episodes of dizziness or falling.   He reports walking 1 mile once a week on the treadmill at the [facility].    Pt confirms he increased his Cleda Daub to 25 mg daily and started Norvasc 5 mg daily as instructed   Pt asking about beach trip week of Christmas. Will send him letter with nearest VAD center and 24/7 emergency contact information.   Nickolas Madrid, PharmD in and spoke with patient about subtherapeutic INR. Instructions given on how and why he needs to use Lovenox injections until INR higher. Pt will check INR Friday.   Vital Signs:  Doppler Pressure:  90  Automatc BP:  105/55 (89) HR: 97 SPO2: 99%  Weight: 165.2 lb w/o eqt Last weight: 166.2 lb Home weights: 157 - 164 lbs   VAD Indication: Destination Therapy due to smoking status  VAD interrogation & Equipment Management: Speed: 5700 Flow: 4.7 Power:  4.7w    PI: 4.4 Hct: 34  Alarms: no clinical alarms Events:  30 - 60 PI events  Fixed speed: 5700 Low speed limit: 5400  Primary Controller:  Replace back up battery in 28 months. Back up controller:   Replace back up battery in 28 months.  Annual Equipment Maintenance on UBC/PM was performed on 07/2018.   I reviewed the LVAD parameters from today and compared the results to the patient's prior recorded data. LVAD interrogation was NEGATIVE for significant power changes, NEGATIVE for clinical alarms and STABLE for PI events/speed drops. No programming changes were made and pump is functioning within specified parameters. Pt is performing daily controller and system monitor self tests along with completing weekly and monthly maintenance for LVAD equipment.  LVAD equipment check completed and is in good working order. Back-up equipment present.   Exit Site Care: Drive line is being maintained [employer]. She reports she "ran out of weekly dressing kits" and used gauze dressing with silver; last changed Sunday 10/25/18. Wife reports his site looks better after rinsing area with sterile saline after cleansing with betadine swabs. Wife asked Katrina Stack, PA to look at site. Existing VAD dressing removed and site care performed using sterile technique.  Increased area of skin irritation noted. Tonye Becket, NP in to assess site. Drive line exit site cleaned with sterile saline only; allowed to dry and gauze dressing with silver strip re-applied. Exit site icorporated, the velour is fully implanted at exit site. Raised, itchy patches underneath dressing and anchor noted. Exit site with small amount yellow drainage;  no redness, tenderness, or foul odor noted. Drive line anchor replaced x 2. Pt denies fever or chills. [name] will switch to daily dressing kits and change daily until pt returns Friday for INR and wound check.       BP & Labs: Doppler 90 and is reflective of MAP.   Hgb 11.6 - No S/S of bleeding. Specifically denies melena/BRBPR or nosebleeds.  LDH stable at 192 with established baseline of 175 - 255. Denies tea-colored urine. No power elevations noted on interrogation.   3 month Intermacs follow up completed including: Quality of Life, KCCQ-12, and Neurocognitive trail making.   Pt completed 1400 feet during 6 minute walk.   Patient Instructions: 1. Change VAD dressing daily until re-check Friday. Apply Nystatin powder on skin rash (except avoid right around drive line exit site) and dab with skin  prep to seal. Clean site gingerly with sterile saline only. Use gauze dressing kits, avoid Sorbaview dressing and Tegaderm covering. Need to avoid any moisture.  2. Return on Friday for INR and wound check.  2. Return to VAD Clinic in 6 weeks.  Hessie Diener, RN VAD Coordinator  Office: [phone]  24/7 Pager: [phone]

## 2018-10-29 ENCOUNTER — Encounter (HOSPITAL_COMMUNITY): Payer: Self-pay | Admitting: *Deleted

## 2018-10-29 ENCOUNTER — Telehealth (HOSPITAL_COMMUNITY): Payer: Self-pay | Admitting: Pharmacist

## 2018-10-29 MED FILL — ENOXAPARIN 80 MG/0.8 ML SYR: 80 | 5 days supply | Qty: 8 | Fill #0

## 2018-10-29 NOTE — Telephone Encounter (Signed)
Enoxaparin 80 mg BID PA approved by [employer] Part D through 11/18/19. [facility] pharmacy does not have any in stock. Will transfer to our Outpatient Pharmacy. Called and left VM for patient and fiance, [name], with this information. Copay should be $1.25.   Tyler Deis. Bonnye Fava, PharmD, BCPS, CPP Clinical Pharmacist Phone: [phone] 10/29/2018 11:16 AM

## 2018-10-30 ENCOUNTER — Ambulatory Visit (HOSPITAL_COMMUNITY)
Admission: RE | Admit: 2018-10-30 | Discharge: 2018-10-30 | Disposition: A | Discharge status: Home / Self Care | Payer: Medicare Other | Source: Ambulatory Visit | Attending: Cardiology | Admitting: Cardiology

## 2018-10-30 ENCOUNTER — Other Ambulatory Visit (HOSPITAL_COMMUNITY): Payer: Self-pay | Admitting: *Deleted

## 2018-10-30 ENCOUNTER — Ambulatory Visit (HOSPITAL_COMMUNITY): Payer: Self-pay | Admitting: Pharmacist

## 2018-10-30 DIAGNOSIS — Z5181 Encounter for therapeutic drug level monitoring: Secondary | ICD-10-CM

## 2018-10-30 DIAGNOSIS — Z4800 Encounter for change or removal of nonsurgical wound dressing: Secondary | ICD-10-CM | POA: Insufficient documentation

## 2018-10-30 DIAGNOSIS — Z95811 Presence of heart assist device: Secondary | ICD-10-CM

## 2018-10-30 DIAGNOSIS — R21 Rash and other nonspecific skin eruption: Secondary | ICD-10-CM | POA: Diagnosis not present

## 2018-10-30 DIAGNOSIS — Z7901 Long term (current) use of anticoagulants: Secondary | ICD-10-CM

## 2018-10-30 LAB — PROTIME-INR
INR: 1.63
Prothrombin Time: 19.2 seconds — ABNORMAL HIGH (ref 11.4–15.2)

## 2018-10-30 NOTE — Addendum Note (Signed)
Encounter addended by: Levonne Spiller, RN on: 10/30/2018 3:33 PM  Actions taken: Clinical Note Signed

## 2018-10-30 NOTE — Progress Notes (Signed)
Patient presents for wound check and INR.  Pt had rash around drive line exit site and abdomen. Tonye Becket, NP started pt on Nystatin powder with daily dressing changes using gauze dressing only two days ago. Site is drying, no new blisters/extension of rash noted. [name] (caregiver) believes it was reaction to [employer] dressing. Will only use gauze dressings with no Tegaderm sheets covering dressing.   Existing VAD dressing removed and site care performed using sterile technique. Drive line exit site cleaned with sterile saline only, allowed to dry, and gauze dressing with silver strip re-applied. Exit site healed and incorporated, the velour is fully implanted at exit site. No redness, tenderness, drainage, foul odor noted. Drive line anchor x 2 re-applied. Pt denies fever or chills. Provided pt with extra cut aquacel silver strips.   Instructed [name] to advance dressing changes to every other day and then twice weekly, then back to weekly if rash completely clears and pt has no drainage from site.    Hessie Diener, RN VAD Coordinator  Office: [phone]  24/7 Pager: [phone]

## 2018-11-02 ENCOUNTER — Ambulatory Visit (HOSPITAL_COMMUNITY): Payer: Self-pay | Admitting: Pharmacist

## 2018-11-02 ENCOUNTER — Telehealth (HOSPITAL_COMMUNITY): Payer: Self-pay | Admitting: Pharmacist

## 2018-11-02 ENCOUNTER — Ambulatory Visit (HOSPITAL_COMMUNITY)
Admission: RE | Admit: 2018-11-02 | Discharge: 2018-11-02 | Disposition: A | Discharge status: Home / Self Care | Payer: Medicare Other | Source: Ambulatory Visit | Attending: Cardiology | Admitting: Cardiology

## 2018-11-02 DIAGNOSIS — Z7901 Long term (current) use of anticoagulants: Secondary | ICD-10-CM | POA: Diagnosis present

## 2018-11-02 DIAGNOSIS — Z95811 Presence of heart assist device: Secondary | ICD-10-CM

## 2018-11-02 LAB — LACTATE DEHYDROGENASE: LDH: 176 U/L (ref 98–192)

## 2018-11-02 LAB — PROTIME-INR
INR: 2.05
Prothrombin Time: 22.9 seconds — ABNORMAL HIGH (ref 11.4–15.2)

## 2018-11-02 NOTE — Telephone Encounter (Signed)
Cardiac Rehab Medication Review by a Pharmacist  Does the patient  feel that his/her medications are working for him/her?  yes  Has the patient been experiencing any side effects to the medications prescribed?  no  Does the patient measure his/her own blood pressure or blood glucose at home? Patient takes BP occassionally - but can't recall what it was today  Does the patient have any problems obtaining medications due to transportation or finances?   no  Understanding of regimen: good Understanding of indications: good Potential of compliance: good  Pharmacist comments:  Patient reported he was recently taken off of furosemide, and has stopped his lovenox injections as of 11/02/2018. Patient also reported taking cyanocobalamin 1000 mcg by mouth daily. Medication changes were updated on patients medication list. Overall patient reported he is doing well and has been able to walk more as well as going to the [facility] to exercise.   Thank you for allowing pharmacy to be a part of this patient's care.  Lenord Carbo, PharmD PGY1 Pharmacy Resident Phone: [phone] 11/02/2018 5:03 PM

## 2018-11-13 ENCOUNTER — Other Ambulatory Visit (HOSPITAL_COMMUNITY): Payer: Self-pay | Admitting: *Deleted

## 2018-11-13 DIAGNOSIS — Z95811 Presence of heart assist device: Secondary | ICD-10-CM

## 2018-11-13 DIAGNOSIS — Z7901 Long term (current) use of anticoagulants: Secondary | ICD-10-CM

## 2018-11-16 ENCOUNTER — Ambulatory Visit (HOSPITAL_COMMUNITY): Payer: Self-pay | Admitting: Pharmacist

## 2018-11-16 ENCOUNTER — Ambulatory Visit (HOSPITAL_COMMUNITY)
Admission: RE | Admit: 2018-11-16 | Discharge: 2018-11-16 | Disposition: A | Discharge status: Home / Self Care | Payer: Medicare Other | Source: Ambulatory Visit | Attending: Cardiology | Admitting: Cardiology

## 2018-11-16 DIAGNOSIS — Z7901 Long term (current) use of anticoagulants: Secondary | ICD-10-CM | POA: Diagnosis not present

## 2018-11-16 DIAGNOSIS — Z95811 Presence of heart assist device: Secondary | ICD-10-CM | POA: Insufficient documentation

## 2018-11-16 LAB — PROTIME-INR
INR: 3.9
PROTHROMBIN TIME: 37.6 s — AB (ref 11.4–15.2)

## 2018-11-16 NOTE — Progress Notes (Signed)
Kari BaarsBilly E Manard 57 y.o. male DOB 04/26/1961 MRN [id number]       Nutrition  No diagnosis found. Past Medical History:  Diagnosis Date  . Cardiomyopathy, unspecified (HCC)   . CHF (congestive heart failure) (HCC)   . Chronic back pain  T II DM    . Enlarged heart   . Gastric ulcer   . Gastroenteritis   . H/O degenerative disc disease    Meds reviewed.    Current Outpatient Medications (Endocrine & Metabolic):  .  metFORMIN (GLUCOPHAGE-XR) 500 MG 24 hr tablet, Take 500 mg by mouth 2 (two) times daily.  Current Outpatient Medications (Cardiovascular):  .  amLODipine (NORVASC) 5 MG tablet, Take 1 tablet (5 mg total) by mouth daily. Marland Kitchen.  atorvastatin (LIPITOR) 20 MG tablet, Take 20 mg by mouth daily.  .  furosemide (LASIX) 40 MG tablet, Take 0.5 tablets (20 mg total) by mouth as needed. (Patient not taking: Reported on 10/14/2018) .  sacubitril-valsartan (ENTRESTO) 97-103 MG, Take 1 tablet by mouth 2 (two) times daily. .  sildenafil (VIAGRA) 100 MG tablet, Take 50 mg by mouth daily as needed for erectile dysfunction. Marland Kitchen.  spironolactone (ALDACTONE) 25 MG tablet, Take 1 tablet (25 mg total) by mouth daily.  Current Outpatient Medications (Respiratory):  .  fluticasone (FLONASE) 50 MCG/ACT nasal spray, Place 1 spray into both nostrils 2 (two) times daily as needed for allergies.  .  Ipratropium-Albuterol (COMBIVENT) 20-100 MCG/ACT AERS respimat, Inhale 1 puff into the lungs every 6 (six) hours.  Current Outpatient Medications (Analgesics):  .  aspirin EC 81 MG tablet, Take 81 mg by mouth daily. Marland Kitchen.  oxyCODONE (OXY IR/ROXICODONE) 5 MG immediate release tablet, Take 1 tablet (5 mg total) by mouth every 8 (eight) hours as needed for severe pain. .  traMADol (ULTRAM) 50 MG tablet, Take 1 tablet (50 mg total) by mouth every 4 (four) hours as needed for moderate pain.  Current Outpatient Medications (Hematological):  .  cyanocobalamin 500 MCG tablet, Take 1,000 mcg by mouth daily.  Marland Kitchen.   enoxaparin (LOVENOX) 80 MG/0.8ML injection, Inject 0.8 mLs (80 mg total) into the skin every 12 (twelve) hours. (Patient not taking: Reported on 11/02/2018) .  warfarin (COUMADIN) 5 MG tablet, Take 1 and 1/2 tablets (7.5 mg) daily except 1 tablet (5 mg) on Monday, Wednesday and Friday  Current Outpatient Medications (Other):  .  carbamide peroxide (DEBROX) 6.5 % otic solution, Place 5 drops into both ears 3 (three) times daily. 5-10 drops .  clotrimazole (LOTRIMIN) 1 % external solution, Apply 1 application topically daily. Marland Kitchen.  docusate sodium (COLACE) 100 MG capsule, Take 2 capsules (200 mg total) by mouth daily as needed for mild constipation. .  DULoxetine (CYMBALTA) 60 MG capsule, Take 60 mg by mouth daily. Marland Kitchen.  gabapentin (NEURONTIN) 300 MG capsule, Take 600 mg by mouth 3 (three) times daily. .  hydrOXYzine (VISTARIL) 25 MG capsule, Take 25 mg by mouth 3 (three) times daily as needed for anxiety. .  methocarbamol (ROBAXIN) 750 MG tablet, Take 750 mg by mouth 3 (three) times daily as needed for muscle spasms.  .  nicotine (NICODERM CQ - DOSED IN MG/24 HR) 7 mg/24hr patch, Place 1 patch (7 mg total) onto the skin daily as needed (tobacco craving). .  nystatin (MYCOSTATIN/NYSTOP) powder, Use with VAD dressing changes daily, advance as directed. Marland Kitchen.  omeprazole (PRILOSEC) 20 MG capsule, Take 20 mg by mouth daily. .  polyethylene glycol (MIRALAX / GLYCOLAX) packet, Take 17 g by  mouth daily as needed for mild constipation. .  thiamine 100 MG tablet, Take 100 mg by mouth daily.   HT: Ht Readings from Last 1 Encounters:  10/28/18 5\' 10"  (1.778 m)    WT: Wt Readings from Last 5 Encounters:  10/28/18 165 lb 3.2 oz (74.9 kg)  10/14/18 165 lb 3.2 oz (74.9 kg)  10/07/18 166 lb 3.2 oz (75.4 kg)  09/23/18 158 lb 6.4 oz (71.8 kg)  09/09/18 156 lb 6.4 oz (70.9 kg)     There is no height or weight on file to calculate BMI.   Current tobacco use? No       Labs:  Lipid Panel     Component Value  Date/Time   CHOL 97 07/21/2018 1012   TRIG 39 07/21/2018 1012   HDL 32 (L) 07/21/2018 1012   CHOLHDL 3.0 07/21/2018 1012   VLDL 8 07/21/2018 1012   LDLCALC 57 07/21/2018 1012    Lab Results  Component Value Date   HGBA1C 6.2 (H) 08/03/2018   CBG (last 3)  No results for input(s): GLUCAP in the last 72 hours.  Nutrition Diagnosis ? Food-and nutrition-related knowledge deficit related to lack of exposure to information as related to diagnosis of: ? CVD ? Type 2 Diabetes  Nutrition Goal(s):  ? To be determined  Plan:  Pt to attend nutrition classes ? Nutrition I ? Nutrition II ? Portion Distortion  ? Diabetes Blitz ? Diabetes Q & A Will provide client-centered nutrition education as part of interdisciplinary care.   Monitor and evaluate progress toward nutrition goal with team.  Ross Marcus, MS, RD, LDN 11/16/2018 10:06 AM

## 2018-11-17 ENCOUNTER — Encounter (HOSPITAL_COMMUNITY)
Admission: RE | Admit: 2018-11-17 | Discharge: 2018-11-17 | Disposition: A | Discharge status: Home / Self Care | Payer: Medicare Other | Source: Ambulatory Visit | Attending: Cardiology | Admitting: Cardiology

## 2018-11-17 ENCOUNTER — Encounter (HOSPITAL_COMMUNITY): Payer: Self-pay

## 2018-11-17 VITALS — Ht 67.75 in | Wt 166.2 lb

## 2018-11-17 DIAGNOSIS — I5022 Chronic systolic (congestive) heart failure: Secondary | ICD-10-CM | POA: Insufficient documentation

## 2018-11-17 DIAGNOSIS — Z95811 Presence of heart assist device: Secondary | ICD-10-CM | POA: Insufficient documentation

## 2018-11-17 LAB — GLUCOSE, CAPILLARY: Glucose-Capillary: 112 mg/dL — ABNORMAL HIGH (ref 70–99)

## 2018-11-17 NOTE — Progress Notes (Signed)
Cardiac Individual Treatment Plan  Patient Details  Name: Theodore Welch MRN: [id number] Date of Birth: 1961/05/23 Referring Provider:   Flowsheet Row CARDIAC REHAB PHASE II ORIENTATION from 11/17/2018 in [facility] CARDIAC REHAB  Referring Provider  Dr. Shirlee Latch      Initial Encounter Date:  Flowsheet Row CARDIAC REHAB PHASE II ORIENTATION from 11/17/2018 in [facility] CARDIAC REH[facility]  Date  11/17/18      Visit Diagnosis: 08/03/2018 LVAD (left ventricular assist device) present (HCC)  2019 Chronic systolic congestive heart failure (HCC)  Patient's Home Medications on Admission:  Current Outpatient Medications:  .  amLODipine (NORVASC) 5 MG tablet, Take 1 tablet (5 mg total) by mouth daily., Disp: 30 tablet, Rfl: 6 .  aspirin EC 81 MG tablet, Take 81 mg by mouth daily., Disp: , Rfl:  .  atorvastatin (LIPITOR) 20 MG tablet, Take 20 mg by mouth daily. , Disp: , Rfl:  .  carbamide peroxide (DEBROX) 6.5 % otic solution, Place 5 drops into both ears 3 (three) times daily. 5-10 drops, Disp: , Rfl:  .  clotrimazole (LOTRIMIN) 1 % external solution, Apply 1 application topically daily., Disp: , Rfl:  .  cyanocobalamin 500 MCG tablet, Take 1,000 mcg by mouth daily. , Disp: , Rfl:  .  docusate sodium (COLACE) 100 MG capsule, Take 2 capsules (200 mg total) by mouth daily as needed for mild constipation., Disp: 10 capsule, Rfl: 0 .  DULoxetine (CYMBALTA) 60 MG capsule, Take 60 mg by mouth daily., Disp: , Rfl:  .  enoxaparin (LOVENOX) 80 MG/0.8ML injection, Inject 0.8 mLs (80 mg total) into the skin every 12 (twelve) hours., Disp: 10 Syringe, Rfl: 3 .  fluticasone (FLONASE) 50 MCG/ACT nasal spray, Place 1 spray into both nostrils 2 (two) times daily as needed for allergies. , Disp: , Rfl:  .  furosemide (LASIX) 40 MG tablet, Take 0.5 tablets (20 mg total) by mouth as needed., Disp: 60 tablet, Rfl: 6 .  gabapentin (NEURONTIN) 300 MG capsule, Take 600 mg by mouth 3  (three) times daily., Disp: , Rfl:  .  hydrOXYzine (VISTARIL) 25 MG capsule, Take 25 mg by mouth 3 (three) times daily as needed for anxiety., Disp: , Rfl:  .  Ipratropium-Albuterol (COMBIVENT) 20-100 MCG/ACT AERS respimat, Inhale 1 puff into the lungs every 6 (six) hours., Disp: , Rfl:  .  metFORMIN (GLUCOPHAGE-XR) 500 MG 24 hr tablet, Take 500 mg by mouth 2 (two) times daily., Disp: , Rfl:  .  methocarbamol (ROBAXIN) 750 MG tablet, Take 750 mg by mouth 3 (three) times daily as needed for muscle spasms. , Disp: , Rfl:  .  nicotine (NICODERM CQ - DOSED IN MG/24 HR) 7 mg/24hr patch, Place 1 patch (7 mg total) onto the skin daily as needed (tobacco craving)., Disp: 28 patch, Rfl: 0 .  nystatin (MYCOSTATIN/NYSTOP) powder, Use with VAD dressing changes daily, advance as directed., Disp: 15 g, Rfl: 3 .  omeprazole (PRILOSEC) 20 MG capsule, Take 20 mg by mouth daily., Disp: , Rfl:  .  oxyCODONE (OXY IR/ROXICODONE) 5 MG immediate release tablet, Take 1 tablet (5 mg total) by mouth every 8 (eight) hours as needed for severe pain., Disp: 30 tablet, Rfl: 0 .  polyethylene glycol (MIRALAX / GLYCOLAX) packet, Take 17 g by mouth daily as needed for mild constipation., Disp: , Rfl:  .  sacubitril-valsartan (ENTRESTO) 97-103 MG, Take 1 tablet by mouth 2 (two) times daily., Disp: 180 tablet, Rfl: 3 .  sildenafil (VIAGRA) 100 MG tablet, Take 50 mg by mouth daily as needed for erectile dysfunction., Disp: , Rfl:  .  spironolactone (ALDACTONE) 25 MG tablet, Take 1 tablet (25 mg total) by mouth daily., Disp: 30 tablet, Rfl: 6 .  thiamine 100 MG tablet, Take 100 mg by mouth daily., Disp: , Rfl:  .  traMADol (ULTRAM) 50 MG tablet, Take 1 tablet (50 mg total) by mouth every 4 (four) hours as needed for moderate pain., Disp: 30 tablet, Rfl: 0 .  warfarin (COUMADIN) 5 MG tablet, Take 1 and 1/2 tablets (7.5 mg) daily except 1 tablet (5 mg) on Monday, Wednesday and Friday, Disp: , Rfl:   Past Medical History: Past Medical  History:  Diagnosis Date  . Cardiomyopathy, unspecified (HCC)   . CHF (congestive heart failure) (HCC)   . Chronic back pain   . Enlarged heart   . Gastric ulcer   . Gastroenteritis   . H/O degenerative disc disease     Tobacco Use: Social History   Tobacco Use  Smoking Status Current Every Day Smoker  . Types: Cigarettes  . Start date: 2003  Smokeless Tobacco Never Used  Tobacco Comment   Nicoderm patch     Labs: Recent Review Flowsheet Data    Labs for ITP Cardiac and Pulmonary Rehab Latest Ref Rng & Units 08/08/2018 08/09/2018 08/10/2018 08/11/2018 08/12/2018   Cholestrol 0 - 200 mg/dL - - - - -   LDLCALC 0 - 99 mg/dL - - - - -   HDL >95>40 mg/dL - - - - -   Trlycerides <150 mg/dL - - - - -   Hemoglobin A1c 4.8 - 5.6 % - - - - -   PHART 7.350 - 7.450 - - - - -   PCO2ART 32.0 - 48.0 mmHg - - - - -   HCO3 20.0 - 28.0 mmol/L - - - - -   TCO2 22 - 32 mmol/L - - - - -   O2SAT % 66.6 61.1 61.0 67.5 73.6      Capillary Blood Glucose: Lab Results  Component Value Date   GLUCAP 112 (H) 11/17/2018   GLUCAP 114 (H) 08/12/2018   GLUCAP 83 08/12/2018   GLUCAP 105 (H) 08/11/2018   GLUCAP 147 (H) 08/11/2018     Exercise Target Goals: Exercise Program Goal: Individual exercise prescription set using results from initial 6 min walk test and THRR while considering  patient's activity barriers and safety.   Exercise Prescription Goal: Initial exercise prescription builds to 30-45 minutes a day of aerobic activity, 2-3 days per week.  Home exercise guidelines will be given to patient during program as part of exercise prescription that the participant will acknowledge.  Activity Barriers & Risk Stratification: Activity Barriers & Cardiac Risk Stratification - 11/17/18 1046    Activity Barriers & Cardiac Risk Stratification          Activity Barriers  Other (comment);Muscular Weakness;Deconditioning;Balance Concerns    Comments  LVAD    Cardiac Risk Stratification  High            6 Minute Walk: 6 Minute Walk    6 Minute Walk    Row Name 11/17/18 1043   Phase  Initial   Distance  1485 feet   Walk Time  6 minutes   # of Rest Breaks  0   MPH  2.8   METS  3.73   RPE  11   VO2 Peak  13.06   Symptoms  No   Resting HR  84 bpm   Resting BP  78/0   Resting Oxygen Saturation   99 %   Max Ex. HR  123 bpm   Max Ex. BP  82/0   2 Minute Post BP  80/0          Oxygen Initial Assessment:   Oxygen Re-Evaluation:   Oxygen Discharge (Final Oxygen Re-Evaluation):   Initial Exercise Prescription: Initial Exercise Prescription - 11/17/18 1000    Date of Initial Exercise RX and Referring Provider          Date  11/17/18    Referring Provider  Dr. Shirlee Latch    Expected Discharge Date  02/26/19        NuStep          Level  3    SPM  85    Minutes  10    METs  3.6        Arm Ergometer          Level  1    Watts  47    Minutes  10    METs  4.3        Track          Laps  11    Minutes  10    METs  2.91        Prescription Details          Frequency (times per week)  3    Duration  Progress to 30 minutes of continuous aerobic without signs/symptoms of physical distress        Intensity          THRR 40-80% of Max Heartrate  64-129    Ratings of Perceived Exertion  11-13        Progression          Progression  Continue to progress workloads to maintain intensity without signs/symptoms of physical distress.        Resistance Training          Training Prescription  Yes    Weight  3 lbs.    Reps  10-15           Perform Capillary Blood Glucose checks as needed.  Exercise Prescription Changes:   Exercise Comments:   Exercise Goals and Review: Exercise Goals    Exercise Goals    Row Name 11/17/18 1047   Increase Physical Activity  Yes   Intervention  Provide advice, education, support and counseling about physical activity/exercise needs.;Develop an individualized exercise prescription for aerobic and resistive  training based on initial evaluation findings, risk stratification, comorbidities and participant's personal goals.   Expected Outcomes  Short Term: Attend rehab on a regular basis to increase amount of physical activity.   Increase Strength and Stamina  Yes   Intervention  Provide advice, education, support and counseling about physical activity/exercise needs.;Develop an individualized exercise prescription for aerobic and resistive training based on initial evaluation findings, risk stratification, comorbidities and participant's personal goals.   Expected Outcomes  Short Term: Increase workloads from initial exercise prescription for resistance, speed, and METs.;Short Term: Perform resistance training exercises routinely during rehab and add in resistance training at home;Long Term: Improve cardiorespiratory fitness, muscular endurance and strength as measured by increased METs and functional capacity ( )   Able to understand and use rate of perceived exertion (RPE) scale  Yes   Intervention  Provide education and explanation on how to use RPE scale   Expected  Outcomes  Short Term: Able to use RPE daily in rehab to express subjective intensity level;Long Term:  Able to use RPE to guide intensity level when exercising independently   Knowledge and understanding of Target Heart Rate Range (THRR)  Yes   Intervention  Provide education and explanation of THRR including how the numbers were predicted and where they are located for reference   Expected Outcomes  Short Term: Able to state/look up THRR;Long Term: Able to use THRR to govern intensity when exercising independently;Short Term: Able to use daily as guideline for intensity in rehab   Able to check pulse independently  Yes   Intervention  Provide education and demonstration on how to check pulse in carotid and radial arteries.;Review the importance of being able to check your own pulse for safety during independent exercise   Expected Outcomes   Short Term: Able to explain why pulse checking is important during independent exercise;Long Term: Able to check pulse independently and accurately   Understanding of Exercise Prescription  Yes   Intervention  Provide education, explanation, and written materials on patient's individual exercise prescription   Expected Outcomes  Short Term: Able to explain program exercise prescription;Long Term: Able to explain home exercise prescription to exercise independently          Exercise Goals Re-Evaluation :   Discharge Exercise Prescription (Final Exercise Prescription Changes):   Nutrition:  Target Goals: Understanding of nutrition guidelines, daily intake of sodium 1500mg , cholesterol 200mg , calories 30% from fat and 7% or less from saturated fats, daily to have 5 or more servings of fruits and vegetables.  Biometrics: Pre Biometrics - 11/17/18 1047    Pre Biometrics          Height  5' 7.75" (1.721 m)    Weight  75.4 kg    Waist Circumference  37 inches    Hip Circumference  39.5 inches    Waist to Hip Ratio  0.94 %    BMI (Calculated)  25.46    Triceps Skinfold  18 mm    % Body Fat  25.8 %    Grip Strength  42 kg    Flexibility  0 in    Single Leg Stand  30 seconds            Nutrition Therapy Plan and Nutrition Goals:   Nutrition Assessments:   Nutrition Goals Re-Evaluation:   Nutrition Goals Re-Evaluation:   Nutrition Goals Discharge (Final Nutrition Goals Re-Evaluation):   Psychosocial: Target Goals: Acknowledge presence or absence of significant depression and/or stress, maximize coping skills, provide positive support system. Participant is able to verbalize types and ability to use techniques and skills needed for reducing stress and depression.  Initial Review & Psychosocial Screening: Initial Psych Review & Screening - 11/17/18 1024    Initial Review          Current issues with  None Identified;Current Anxiety/Panic   health related anxiety         Family Dynamics          Good Support System?  Yes   family       Barriers          Psychosocial barriers to participate in program  The patient should benefit from training in stress management and relaxation.        Screening Interventions          Interventions  Encouraged to exercise;To provide support and resources with identified psychosocial needs    Expected Outcomes  Short Term goal: Utilizing psychosocial counselor, staff and physician to assist with identification of specific Stressors or current issues interfering with healing process. Setting desired goal for each stressor or current issue identified.;Long Term Goal: Stressors or current issues are controlled or eliminated.           Quality of Life Scores: Quality of Life - 11/17/18 1025    Quality of Life          Select  Quality of Life        Quality of Life Scores          Health/Function Pre  30 %    Socioeconomic Pre  30 %    Psych/Spiritual Pre  30 %    Family Pre  30 %    GLOBAL Pre  30 %          Scores of 19 and below usually indicate a poorer quality of life in these areas.  A difference of  2-3 points is a clinically meaningful difference.  A difference of 2-3 points in the total score of the Quality of Life Index has been associated with significant improvement in overall quality of life, self-image, physical symptoms, and general health in studies assessing change in quality of life.  PHQ-9: Recent Review Flowsheet Data    Depression screen [facility] 2/9 04/22/2017 01/27/2017   Decreased Interest 0 0   Down, Depressed, Hopeless 0 2   PHQ - 2 Score 0 2   Altered sleeping - 3   Tired, decreased energy - 1   Change in appetite - 3   Feeling bad or failure about yourself  - 0   Trouble concentrating - 1   Moving slowly or fidgety/restless - 0   Suicidal thoughts - 0   PHQ-9 Score - 10   Difficult doing work/chores - Somewhat difficult     Interpretation of Total Score  Total Score  Depression Severity:  1-4 = Minimal depression, 5-9 = Mild depression, 10-14 = Moderate depression, 15-19 = Moderately severe depression, 20-27 = Severe depression   Psychosocial Evaluation and Intervention:   Psychosocial Re-Evaluation:   Psychosocial Discharge (Final Psychosocial Re-Evaluation):   Vocational Rehabilitation: Provide vocational rehab assistance to qualifying candidates.   Vocational Rehab Evaluation & Intervention: Vocational Rehab - 11/17/18 1026    Initial Vocational Rehab Evaluation & Intervention          Assessment shows need for Vocational Rehabilitation  No   disabled           Education: Education Goals: Education classes will be provided on a weekly basis, covering required topics. Participant will state understanding/return demonstration of topics presented.  Learning Barriers/Preferences: Learning Barriers/Preferences - 11/17/18 1048    Learning Barriers/Preferences          Learning Barriers  Hearing    Learning Preferences  Audio;Computer/Internet;Group Instruction;Individual Instruction;Pictoral;Skilled Demonstration;Verbal Instruction;Video;Written Material           Education Topics: Count Your Pulse:  -Group instruction provided by verbal instruction, demonstration, patient participation and written materials to support subject.  Instructors address importance of being able to find your pulse and how to count your pulse when at home without a heart monitor.  Patients get hands on experience counting their pulse with staff help and individually.   Heart Attack, Angina, and Risk Factor Modification:  -Group instruction provided by verbal instruction, video, and written materials to support subject.  Instructors address signs and symptoms of angina and heart attacks.  Also discuss risk factors for heart disease and how to make changes to improve heart health risk factors.   Functional Fitness:  -Group instruction provided by verbal  instruction, demonstration, patient participation, and written materials to support subject.  Instructors address safety measures for doing things around the house.  Discuss how to get up and down off the floor, how to pick things up properly, how to safely get out of a chair without assistance, and balance training.   Meditation and Mindfulness:  -Group instruction provided by verbal instruction, patient participation, and written materials to support subject.  Instructor addresses importance of mindfulness and meditation practice to help reduce stress and improve awareness.  Instructor also leads participants through a meditation exercise.    Stretching for Flexibility and Mobility:  -Group instruction provided by verbal instruction, patient participation, and written materials to support subject.  Instructors lead participants through series of stretches that are designed to increase flexibility thus improving mobility.  These stretches are additional exercise for major muscle groups that are typically performed during regular warm up and cool down.   Hands Only CPR:  -Group verbal, video, and participation provides a basic overview of AHA guidelines for community CPR. Role-play of emergencies allow participants the opportunity to practice calling for help and chest compression technique with discussion of AED use.   Hypertension: -Group verbal and written instruction that provides a basic overview of hypertension including the most recent diagnostic guidelines, risk factor reduction with self-care instructions and medication management.    Nutrition I class: Heart Healthy Eating:  -Group instruction provided by PowerPoint slides, verbal discussion, and written materials to support subject matter. The instructor gives an explanation and review of the Therapeutic Lifestyle Changes diet recommendations, which includes a discussion on lipid goals, dietary fat, sodium, fiber, plant stanol/sterol  esters, sugar, and the components of a well-balanced, healthy diet.   Nutrition II class: Lifestyle Skills:  -Group instruction provided by PowerPoint slides, verbal discussion, and written materials to support subject matter. The instructor gives an explanation and review of label reading, grocery shopping for heart health, heart healthy recipe modifications, and ways to make healthier choices when eating out.   Diabetes Question & Answer:  -Group instruction provided by PowerPoint slides, verbal discussion, and written materials to support subject matter. The instructor gives an explanation and review of diabetes co-morbidities, pre- and post-prandial blood glucose goals, pre-exercise blood glucose goals, signs, symptoms, and treatment of hypoglycemia and hyperglycemia, and foot care basics.   Diabetes Blitz:  -Group instruction provided by PowerPoint slides, verbal discussion, and written materials to support subject matter. The instructor gives an explanation and review of the physiology behind type 1 and type 2 diabetes, diabetes medications and rational behind using different medications, pre- and post-prandial blood glucose recommendations and Hemoglobin A1c goals, diabetes diet, and exercise including blood glucose guidelines for exercising safely.    Portion Distortion:  -Group instruction provided by PowerPoint slides, verbal discussion, written materials, and food models to support subject matter. The instructor gives an explanation of serving size versus portion size, changes in portions sizes over the last 20 years, and what consists of a serving from each food group.   Stress Management:  -Group instruction provided by verbal instruction, video, and written materials to support subject matter.  Instructors review role of stress in heart disease and how to cope with stress positively.     Exercising on Your Own:  -Group instruction provided by verbal instruction, power point, and  written  materials to support subject.  Instructors discuss benefits of exercise, components of exercise, frequency and intensity of exercise, and end points for exercise.  Also discuss use of nitroglycerin and activating EMS.  Review options of places to exercise outside of rehab.  Review guidelines for sex with heart disease.   Cardiac Drugs I:  -Group instruction provided by verbal instruction and written materials to support subject.  Instructor reviews cardiac drug classes: antiplatelets, anticoagulants, beta blockers, and statins.  Instructor discusses reasons, side effects, and lifestyle considerations for each drug class.   Cardiac Drugs II:  -Group instruction provided by verbal instruction and written materials to support subject.  Instructor reviews cardiac drug classes: angiotensin converting enzyme inhibitors (ACE-I), angiotensin II receptor blockers (ARBs), nitrates, and calcium channel blockers.  Instructor discusses reasons, side effects, and lifestyle considerations for each drug class.   Anatomy and Physiology of the Circulatory System:  Group verbal and written instruction and models provide basic cardiac anatomy and physiology, with the coronary electrical and arterial systems. Review of: AMI, Angina, Valve disease, Heart Failure, Peripheral Artery Disease, Cardiac Arrhythmia, Pacemakers, and the ICD.   Other Education:  -Group or individual verbal, written, or video instructions that support the educational goals of the cardiac rehab program.   Holiday Eating Survival Tips:  -Group instruction provided by PowerPoint slides, verbal discussion, and written materials to support subject matter. The instructor gives patients tips, tricks, and techniques to help them not only survive but enjoy the holidays despite the onslaught of food that accompanies the holidays.   Knowledge Questionnaire Score: Knowledge Questionnaire Score - 11/17/18 1026    Knowledge Questionnaire Score           Pre Score  25/28           Core Components/Risk Factors/Patient Goals at Admission: Personal Goals and Risk Factors at Admission - 11/17/18 1049    Core Components/Risk Factors/Patient Goals on Admission           Weight Management  Yes;Weight Maintenance    Intervention  Weight Management: Develop a combined nutrition and exercise program designed to reach desired caloric intake, while maintaining appropriate intake of nutrient and fiber, sodium and fats, and appropriate energy expenditure required for the weight goal.;Weight Management: Provide education and appropriate resources to help participant work on and attain dietary goals.    Admit Weight  166 lb 3.6 oz (75.4 kg)    Expected Outcomes  Short Term: Continue to assess and modify interventions until short term weight is achieved;Long Term: Adherence to nutrition and physical activity/exercise program aimed toward attainment of established weight goal;Weight Maintenance: Understanding of the daily nutrition guidelines, which includes 25-35% calories from fat, 7% or less cal from saturated fats, less than 200mg  cholesterol, less than 1.5gm of sodium, & 5 or more servings of fruits and vegetables daily;Understanding recommendations for meals to include 15-35% energy as protein, 25-35% energy from fat, 35-60% energy from carbohydrates, less than 200mg  of dietary cholesterol, 20-35 gm of total fiber daily;Understanding of distribution of calorie intake throughout the day with the consumption of 4-5 meals/snacks    Diabetes  Yes    Intervention  Provide education about signs/symptoms and action to take for hypo/hyperglycemia.;Provide education about proper nutrition, including hydration, and aerobic/resistive exercise prescription along with prescribed medications to achieve blood glucose in normal ranges: Fasting glucose 65-99 mg/dL    Expected Outcomes  Short Term: Participant verbalizes understanding of the signs/symptoms and immediate  care of hyper/hypoglycemia, proper foot care and importance  of medication, aerobic/resistive exercise and nutrition plan for blood glucose control.;Long Term: Attainment of HbA1C < 7%.    Heart Failure  Yes    Intervention  Provide a combined exercise and nutrition program that is supplemented with education, support and counseling about heart failure. Directed toward relieving symptoms such as shortness of breath, decreased exercise tolerance, and extremity edema.    Expected Outcomes  Improve functional capacity of life;Short term: Attendance in program 2-3 days a week with increased exercise capacity. Reported lower sodium intake. Reported increased fruit and vegetable intake. Reports medication compliance.;Short term: Daily weights obtained and reported for increase. Utilizing diuretic protocols set by physician.;Long term: Adoption of self-care skills and reduction of barriers for early signs and symptoms recognition and intervention leading to self-care maintenance.    Hypertension  Yes    Intervention  Provide education on lifestyle modifcations including regular physical activity/exercise, weight management, moderate sodium restriction and increased consumption of fresh fruit, vegetables, and low fat dairy, alcohol moderation, and smoking cessation.;Monitor prescription use compliance.    Expected Outcomes  Long Term: Maintenance of blood pressure at goal levels.;Short Term: Continued assessment and intervention until BP is < 140/64mm HG in hypertensive participants. < 130/63mm HG in hypertensive participants with diabetes, heart failure or chronic kidney disease.    Lipids  Yes    Intervention  Provide education and support for participant on nutrition & aerobic/resistive exercise along with prescribed medications to achieve LDL 70mg , HDL >40mg .    Expected Outcomes  Short Term: Participant states understanding of desired cholesterol values and is compliant with medications prescribed. Participant  is following exercise prescription and nutrition guidelines.;Long Term: Cholesterol controlled with medications as prescribed, with individualized exercise RX and with personalized nutrition plan. Value goals: LDL < 70mg , HDL > 40 mg.    Stress  Yes    Intervention  Offer individual and/or small group education and counseling on adjustment to heart disease, stress management and health-related lifestyle change. Teach and support self-help strategies.;Refer participants experiencing significant psychosocial distress to appropriate mental health specialists for further evaluation and treatment. When possible, include family members and significant others in education/counseling sessions.    Expected Outcomes  Short Term: Participant demonstrates changes in health-related behavior, relaxation and other stress management skills, ability to obtain effective social support, and compliance with psychotropic medications if prescribed.;Long Term: Emotional wellbeing is indicated by absence of clinically significant psychosocial distress or social isolation.           Core Components/Risk Factors/Patient Goals Review:    Core Components/Risk Factors/Patient Goals at Discharge (Final Review):    ITP Comments: ITP Comments    Row Name 11/17/18 [phone]   ITP Comments  Dr. Armanda Magic, Medical Director       Comments:Patient attended orientation from [phone] to [phone] to review rules and guidelines for program. Completed 6 minute walk test, Intitial ITP, and exercise prescription.  VSS. Telemetry-sinus rhythm, LVH,   Asymptomatic. Deveron Furlong, RN, BSN Cardiac Pulmonary Rehab 11/17/18 11:54 AM

## 2018-11-17 NOTE — Progress Notes (Signed)
Kari BaarsBilly E Hollars 57 y.o. male DOB: 02/27/1961 MRN: [id number]      Nutrition Note  1. 08/03/2018 LVAD (left ventricular assist device) present (HCC)   2. 2019 Chronic systolic congestive heart failure [facility](HCC)    Past Medical History:  Diagnosis Date  . Cardiomyopathy, unspecified (HCC)   . CHF (congestive heart failure) (HCC)   . Chronic back pain   . Enlarged heart   . Gastric ulcer   . Gastroenteritis   . H/O degenerative disc disease    Meds reviewed.   Current Outpatient Medications (Endocrine & Metabolic):  .  metFORMIN (GLUCOPHAGE-XR) 500 MG 24 hr tablet, Take 500 mg by mouth 2 (two) times daily.  Current Outpatient Medications (Cardiovascular):  .  amLODipine (NORVASC) 5 MG tablet, Take 1 tablet (5 mg total) by mouth daily. Marland Kitchen.  atorvastatin (LIPITOR) 20 MG tablet, Take 20 mg by mouth daily.  .  furosemide (LASIX) 40 MG tablet, Take 0.5 tablets (20 mg total) by mouth as needed. .  sacubitril-valsartan (ENTRESTO) 97-103 MG, Take 1 tablet by mouth 2 (two) times daily. .  sildenafil (VIAGRA) 100 MG tablet, Take 50 mg by mouth daily as needed for erectile dysfunction. Marland Kitchen.  spironolactone (ALDACTONE) 25 MG tablet, Take 1 tablet (25 mg total) by mouth daily.  Current Outpatient Medications (Respiratory):  .  fluticasone (FLONASE) 50 MCG/ACT nasal spray, Place 1 spray into both nostrils 2 (two) times daily as needed for allergies.  .  Ipratropium-Albuterol (COMBIVENT) 20-100 MCG/ACT AERS respimat, Inhale 1 puff into the lungs every 6 (six) hours.  Current Outpatient Medications (Analgesics):  .  aspirin EC 81 MG tablet, Take 81 mg by mouth daily. Marland Kitchen.  oxyCODONE (OXY IR/ROXICODONE) 5 MG immediate release tablet, Take 1 tablet (5 mg total) by mouth every 8 (eight) hours as needed for severe pain. .  traMADol (ULTRAM) 50 MG tablet, Take 1 tablet (50 mg total) by mouth every 4 (four) hours as needed for moderate pain.  Current Outpatient Medications (Hematological):  .  cyanocobalamin 500  MCG tablet, Take 1,000 mcg by mouth daily.  Marland Kitchen.  enoxaparin (LOVENOX) 80 MG/0.8ML injection, Inject 0.8 mLs (80 mg total) into the skin every 12 (twelve) hours. Marland Kitchen.  warfarin (COUMADIN) 5 MG tablet, Take 1 and 1/2 tablets (7.5 mg) daily except 1 tablet (5 mg) on Monday, Wednesday and Friday  Current Outpatient Medications (Other):  .  carbamide peroxide (DEBROX) 6.5 % otic solution, Place 5 drops into both ears 3 (three) times daily. 5-10 drops .  clotrimazole (LOTRIMIN) 1 % external solution, Apply 1 application topically daily. Marland Kitchen.  docusate sodium (COLACE) 100 MG capsule, Take 2 capsules (200 mg total) by mouth daily as needed for mild constipation. .  DULoxetine (CYMBALTA) 60 MG capsule, Take 60 mg by mouth daily. Marland Kitchen.  gabapentin (NEURONTIN) 300 MG capsule, Take 600 mg by mouth 3 (three) times daily. .  hydrOXYzine (VISTARIL) 25 MG capsule, Take 25 mg by mouth 3 (three) times daily as needed for anxiety. .  methocarbamol (ROBAXIN) 750 MG tablet, Take 750 mg by mouth 3 (three) times daily as needed for muscle spasms.  .  nicotine (NICODERM CQ - DOSED IN MG/24 HR) 7 mg/24hr patch, Place 1 patch (7 mg total) onto the skin daily as needed (tobacco craving). .  nystatin (MYCOSTATIN/NYSTOP) powder, Use with VAD dressing changes daily, advance as directed. Marland Kitchen.  omeprazole (PRILOSEC) 20 MG capsule, Take 20 mg by mouth daily. .  polyethylene glycol (MIRALAX / GLYCOLAX) packet, Take 17 g  by mouth daily as needed for mild constipation. .  thiamine 100 MG tablet, Take 100 mg by mouth daily.   HT: Ht Readings from Last 1 Encounters:  11/17/18 5' 7.75" (1.721 m)    WT: Wt Readings from Last 5 Encounters:  11/17/18 166 lb 3.6 oz (75.4 kg)  10/28/18 165 lb 3.2 oz (74.9 kg)  10/14/18 165 lb 3.2 oz (74.9 kg)  10/07/18 166 lb 3.2 oz (75.4 kg)  09/23/18 158 lb 6.4 oz (71.8 kg)     Body mass index is 25.46 kg/m.   Current tobacco use? No  Labs:  Lipid Panel     Component Value Date/Time   CHOL 97  07/21/2018 1012   TRIG 39 07/21/2018 1012   HDL 32 (L) 07/21/2018 1012   CHOLHDL 3.0 07/21/2018 1012   VLDL 8 07/21/2018 1012   LDLCALC 57 07/21/2018 1012    Lab Results  Component Value Date   HGBA1C 6.2 (H) 08/03/2018   CBG (last 3)  Recent Labs    11/17/18 0835  GLUCAP 112*    Nutrition Note Spoke with pt. Nutrition plan and goals reviewed with pt. Pt is following Step 1 of the Therapeutic Lifestyle Changes diet. H3eart healthy diabetic eating tips reviewed (label reading, how to build a healthy plate, portion sizes, eating frequently across the day). Pt has Type 2 Diabetes. Last A1c indicates blood glucose well-controlled. This Clinical research associate went over Diabetes Education test results. Pt checks CBG's 2 times a day. Fasting CBG's reportedly 120-140's mg/dL. Per discussion, pt does not use canned/convenience foods often. Pt does not add salt to food. Pt does not eat out frequently. Pt expressed understanding of the information reviewed. Pt aware of nutrition education classes offered and would not like to attend nutrition classes.  Nutrition Diagnosis ? Food-and nutrition-related knowledge deficit related to lack of exposure to information as related to diagnosis of: ? CVD ? Type 2 Diabetes Nutrition Intervention ? Pt's individual nutrition plan and goals reviewed with pt. ? Pt given handouts for: ? Nutrition I class ? Nutrition II class  ? Diabetes Blitz Class ? Diabetes Q & A class ? DM   Nutrition Goal(s):  ? Pt to identify and limit food sources of saturated fat, trans fat, refined carbohydrates and sodium ? Pt to watch out for sweets and added sugar. ? Pt able to name foods that affect blood glucose.   Plan:  ? Pt to attend nutrition classes ? Nutrition I ? Nutrition II ? Portion Distortion  ? Diabetes Blitz ? Diabetes Q & Ae determined ? Will provide client-centered nutrition education as part of interdisciplinary care ? Monitor and evaluate progress toward nutrition goal with  team.   Ross Marcus, MS, RD, LDN 11/17/2018 12:11 PM

## 2018-11-19 ENCOUNTER — Telehealth (HOSPITAL_COMMUNITY): Payer: Self-pay | Admitting: Cardiology

## 2018-11-19 NOTE — Telephone Encounter (Signed)
PA submitted via [employer] for entresto

## 2018-11-20 ENCOUNTER — Other Ambulatory Visit (HOSPITAL_COMMUNITY): Payer: Self-pay | Admitting: Unknown Physician Specialty

## 2018-11-20 DIAGNOSIS — Z95811 Presence of heart assist device: Secondary | ICD-10-CM

## 2018-11-20 DIAGNOSIS — Z7901 Long term (current) use of anticoagulants: Secondary | ICD-10-CM

## 2018-11-21 ENCOUNTER — Telehealth (HOSPITAL_COMMUNITY): Payer: Self-pay | Admitting: Unknown Physician Specialty

## 2018-11-21 MED ORDER — DOXYCYCLINE HYCLATE 100 MG PO CAPS: 100.0000 mg | ORAL_CAPSULE | Freq: Two times a day (BID) | ORAL | 0 refills | Status: DC

## 2018-11-21 NOTE — Telephone Encounter (Signed)
Received page from pts fiance [name]. Stating that pt has "pus and blood gushing" from the old abdominal incision near his navel. [name] states that the pt looked down and saw the blood and fluid. Pt denies any trauma to the area. [name] states that there is a "hole" at one end of the incision that the fluid is draining from. [name] also states that the driveline has "pus" coming out of it. Pt denies fevers or chills. [name] was instructed to clean the old incision with a chloraprep and place a silver strip in the area that is draining and cover with 4 x 4. [name] also instructed that if the pt develops fever or chills to page VAD pager. Dr. Shirlee Latch aware. Pt instructed to start Doxycycline 100mg  bid. We will see the pt in clinic on Monday to assess extent of the wound and to assess the driveline. Doxycycline called into the [facility] on [address].  [name] RN, BSN VAD Coordinator 24/7 Pager [phone]

## 2018-11-23 ENCOUNTER — Other Ambulatory Visit (HOSPITAL_COMMUNITY): Payer: Medicare Other

## 2018-11-23 ENCOUNTER — Ambulatory Visit (HOSPITAL_COMMUNITY)
Admission: RE | Admit: 2018-11-23 | Discharge: 2018-11-23 | Disposition: A | Discharge status: Home / Self Care | Payer: Medicare HMO | Source: Ambulatory Visit | Attending: Cardiology | Admitting: Cardiology

## 2018-11-23 ENCOUNTER — Inpatient Hospital Stay (HOSPITAL_COMMUNITY)
Admission: AD | Admit: 2018-11-23 | Discharge: 2018-12-10 | DRG: 264 | Disposition: A | Discharge status: Home Health | Payer: Medicare HMO | Source: Ambulatory Visit | Attending: Cardiology | Admitting: Cardiology

## 2018-11-23 ENCOUNTER — Ambulatory Visit (HOSPITAL_COMMUNITY)
Admission: RE | Admit: 2018-11-23 | Discharge: 2018-11-23 | Disposition: A | Discharge status: Home / Self Care | Payer: Medicare HMO | Source: Ambulatory Visit | Attending: Internal Medicine | Admitting: Internal Medicine

## 2018-11-23 ENCOUNTER — Other Ambulatory Visit (HOSPITAL_COMMUNITY): Payer: Self-pay | Admitting: Unknown Physician Specialty

## 2018-11-23 DIAGNOSIS — Z79899 Other long term (current) drug therapy: Secondary | ICD-10-CM

## 2018-11-23 DIAGNOSIS — R1084 Generalized abdominal pain: Secondary | ICD-10-CM

## 2018-11-23 DIAGNOSIS — Y831 Surgical operation with implant of artificial internal device as the cause of abnormal reaction of the patient, or of later complication, without mention of misadventure at the time of the procedure: Secondary | ICD-10-CM | POA: Diagnosis present

## 2018-11-23 DIAGNOSIS — B9561 Methicillin susceptible Staphylococcus aureus infection as the cause of diseases classified elsewhere: Secondary | ICD-10-CM | POA: Diagnosis present

## 2018-11-23 DIAGNOSIS — T829XXA Unspecified complication of cardiac and vascular prosthetic device, implant and graft, initial encounter: Secondary | ICD-10-CM | POA: Diagnosis present

## 2018-11-23 DIAGNOSIS — F17210 Nicotine dependence, cigarettes, uncomplicated: Secondary | ICD-10-CM

## 2018-11-23 DIAGNOSIS — Z9079 Acquired absence of other genital organ(s): Secondary | ICD-10-CM | POA: Diagnosis not present

## 2018-11-23 DIAGNOSIS — I110 Hypertensive heart disease with heart failure: Secondary | ICD-10-CM | POA: Diagnosis present

## 2018-11-23 DIAGNOSIS — K7460 Unspecified cirrhosis of liver: Secondary | ICD-10-CM | POA: Diagnosis present

## 2018-11-23 DIAGNOSIS — I5022 Chronic systolic (congestive) heart failure: Secondary | ICD-10-CM | POA: Diagnosis present

## 2018-11-23 DIAGNOSIS — I82401 Acute embolism and thrombosis of unspecified deep veins of right lower extremity: Secondary | ICD-10-CM | POA: Diagnosis present

## 2018-11-23 DIAGNOSIS — Z7951 Long term (current) use of inhaled steroids: Secondary | ICD-10-CM | POA: Diagnosis not present

## 2018-11-23 DIAGNOSIS — Z716 Tobacco abuse counseling: Secondary | ICD-10-CM | POA: Diagnosis not present

## 2018-11-23 DIAGNOSIS — Z23 Encounter for immunization: Secondary | ICD-10-CM

## 2018-11-23 DIAGNOSIS — Z79891 Long term (current) use of opiate analgesic: Secondary | ICD-10-CM

## 2018-11-23 DIAGNOSIS — Z7982 Long term (current) use of aspirin: Secondary | ICD-10-CM

## 2018-11-23 DIAGNOSIS — T827XXA Infection and inflammatory reaction due to other cardiac and vascular devices, implants and grafts, initial encounter: Principal | ICD-10-CM | POA: Diagnosis present

## 2018-11-23 DIAGNOSIS — L089 Local infection of the skin and subcutaneous tissue, unspecified: Secondary | ICD-10-CM | POA: Diagnosis not present

## 2018-11-23 DIAGNOSIS — Z95811 Presence of heart assist device: Secondary | ICD-10-CM

## 2018-11-23 DIAGNOSIS — Z7901 Long term (current) use of anticoagulants: Secondary | ICD-10-CM | POA: Insufficient documentation

## 2018-11-23 DIAGNOSIS — I429 Cardiomyopathy, unspecified: Secondary | ICD-10-CM | POA: Diagnosis not present

## 2018-11-23 DIAGNOSIS — I5023 Acute on chronic systolic (congestive) heart failure: Secondary | ICD-10-CM

## 2018-11-23 DIAGNOSIS — I82409 Acute embolism and thrombosis of unspecified deep veins of unspecified lower extremity: Secondary | ICD-10-CM | POA: Diagnosis present

## 2018-11-23 DIAGNOSIS — L02211 Cutaneous abscess of abdominal wall: Secondary | ICD-10-CM | POA: Insufficient documentation

## 2018-11-23 DIAGNOSIS — Z7984 Long term (current) use of oral hypoglycemic drugs: Secondary | ICD-10-CM

## 2018-11-23 DIAGNOSIS — L03311 Cellulitis of abdominal wall: Secondary | ICD-10-CM | POA: Diagnosis present

## 2018-11-23 DIAGNOSIS — E785 Hyperlipidemia, unspecified: Secondary | ICD-10-CM | POA: Diagnosis present

## 2018-11-23 DIAGNOSIS — I509 Heart failure, unspecified: Secondary | ICD-10-CM

## 2018-11-23 DIAGNOSIS — Z720 Tobacco use: Secondary | ICD-10-CM | POA: Diagnosis present

## 2018-11-23 DIAGNOSIS — G4733 Obstructive sleep apnea (adult) (pediatric): Secondary | ICD-10-CM | POA: Diagnosis present

## 2018-11-23 DIAGNOSIS — B369 Superficial mycosis, unspecified: Secondary | ICD-10-CM | POA: Diagnosis present

## 2018-11-23 DIAGNOSIS — I428 Other cardiomyopathies: Secondary | ICD-10-CM | POA: Insufficient documentation

## 2018-11-23 DIAGNOSIS — T829XXD Unspecified complication of cardiac and vascular prosthetic device, implant and graft, subsequent encounter: Secondary | ICD-10-CM | POA: Diagnosis not present

## 2018-11-23 DIAGNOSIS — I10 Essential (primary) hypertension: Secondary | ICD-10-CM | POA: Diagnosis present

## 2018-11-23 DIAGNOSIS — I5043 Acute on chronic combined systolic (congestive) and diastolic (congestive) heart failure: Secondary | ICD-10-CM | POA: Diagnosis not present

## 2018-11-23 DIAGNOSIS — G8929 Other chronic pain: Secondary | ICD-10-CM | POA: Diagnosis present

## 2018-11-23 DIAGNOSIS — E119 Type 2 diabetes mellitus without complications: Secondary | ICD-10-CM | POA: Diagnosis present

## 2018-11-23 DIAGNOSIS — R21 Rash and other nonspecific skin eruption: Secondary | ICD-10-CM | POA: Diagnosis not present

## 2018-11-23 LAB — BASIC METABOLIC PANEL
ANION GAP: 15 (ref 5–15)
BUN: 11 mg/dL (ref 6–20)
CO2: 18 mmol/L — ABNORMAL LOW (ref 22–32)
Calcium: 9.2 mg/dL (ref 8.9–10.3)
Chloride: 105 mmol/L (ref 98–111)
Creatinine, Ser: 0.95 mg/dL (ref 0.61–1.24)
GFR calc Af Amer: >60 mL/min (ref >60)
GFR calc non Af Amer: >60 mL/min (ref >60)
Glucose, Bld: 250 mg/dL — ABNORMAL HIGH (ref 70–99)
Potassium: 4 mmol/L (ref 3.5–5.1)
Sodium: 138 mmol/L (ref 135–145)

## 2018-11-23 LAB — CBC
HCT: 39.9 % (ref 39.0–52.0)
Hemoglobin: 11.6 g/dL — ABNORMAL LOW (ref 13.0–17.0)
MCH: 24.5 pg — ABNORMAL LOW (ref 26.0–34.0)
MCHC: 29.1 g/dL — ABNORMAL LOW (ref 30.0–36.0)
MCV: 84.2 fL (ref 80.0–100.0)
Platelets: 312 10*3/uL (ref 150–400)
RBC: 4.74 MIL/uL (ref 4.22–5.81)
RDW: 16.9 % — ABNORMAL HIGH (ref 11.5–15.5)
WBC: 10.4 10*3/uL (ref 4.0–10.5)
nRBC: 0 % (ref 0.0–0.2)

## 2018-11-23 LAB — HEPARIN LEVEL (UNFRACTIONATED): Heparin Unfractionated: 0.11 IU/mL — ABNORMAL LOW (ref 0.30–0.70)

## 2018-11-23 LAB — GLUCOSE, CAPILLARY: Glucose-Capillary: 87 mg/dL (ref 70–99)

## 2018-11-23 LAB — PROTIME-INR
INR: 1.56
Prothrombin Time: 18.5 seconds — ABNORMAL HIGH (ref 11.4–15.2)

## 2018-11-23 LAB — LACTATE DEHYDROGENASE: LDH: 165 U/L (ref 98–192)

## 2018-11-23 MED ORDER — METFORMIN HCL ER 500 MG PO TB24
500.0000 mg | ORAL_TABLET | Freq: Two times a day (BID) | ORAL | Status: DC
  Administered 2018-11-23 – 2018-12-10 (×27): 500 mg via ORAL
  Filled 2018-11-23 (×34): qty 1

## 2018-11-23 MED ORDER — NYSTATIN 100000 UNIT/GM EX POWD
Freq: Two times a day (BID) | CUTANEOUS | Status: DC
  Administered 2018-12-04 – 2018-12-08 (×4): via TOPICAL
  Filled 2018-11-23: qty 15

## 2018-11-23 MED ORDER — HEPARIN (PORCINE) 25000 UT/250ML-% IV SOLN
1600.0000 [IU]/h | INTRAVENOUS | Status: DC
  Administered 2018-11-23: 1300 [IU]/h via INTRAVENOUS
  Administered 2018-11-24 – 2018-11-25 (×3): 1600 [IU]/h via INTRAVENOUS
  Filled 2018-11-23 (×4): qty 250

## 2018-11-23 MED ORDER — IPRATROPIUM-ALBUTEROL 0.5-2.5 (3) MG/3ML IN SOLN
3.0000 mL | Freq: Four times a day (QID) | RESPIRATORY_TRACT | Status: DC
  Administered 2018-11-23: 3 mL via RESPIRATORY_TRACT
  Filled 2018-11-23 (×2): qty 3

## 2018-11-23 MED ORDER — HYDROXYZINE HCL 10 MG PO TABS
25.0000 mg | ORAL_TABLET | Freq: Three times a day (TID) | ORAL | Status: DC | PRN
  Administered 2018-11-24 – 2018-12-09 (×7): 25 mg via ORAL
  Filled 2018-11-23 (×7): qty 3

## 2018-11-23 MED ORDER — DULOXETINE HCL 60 MG PO CPEP
60.0000 mg | ORAL_CAPSULE | Freq: Every day | ORAL | Status: DC
  Administered 2018-11-24 – 2018-12-10 (×16): 60 mg via ORAL
  Filled 2018-11-23 (×16): qty 1

## 2018-11-23 MED ORDER — TRAMADOL HCL 50 MG PO TABS
50.0000 mg | ORAL_TABLET | ORAL | Status: DC | PRN
  Administered 2018-11-26: 50 mg via ORAL
  Filled 2018-11-23: qty 1

## 2018-11-23 MED ORDER — VANCOMYCIN HCL 10 G IV SOLR
1500.0000 mg | Freq: Once | INTRAVENOUS | Status: AC
  Administered 2018-11-23: 1500 mg via INTRAVENOUS
  Filled 2018-11-23: qty 1500

## 2018-11-23 MED ORDER — DIPHENHYDRAMINE HCL 25 MG PO CAPS
25.0000 mg | ORAL_CAPSULE | Freq: Three times a day (TID) | ORAL | Status: DC | PRN
  Administered 2018-11-23 – 2018-12-09 (×22): 25 mg via ORAL
  Filled 2018-11-23 (×23): qty 1

## 2018-11-23 MED ORDER — VANCOMYCIN HCL IN DEXTROSE 1-5 GM/200ML-% IV SOLN
1000.0000 mg | Freq: Two times a day (BID) | INTRAVENOUS | Status: DC
  Administered 2018-11-24 – 2018-11-25 (×3): 1000 mg via INTRAVENOUS
  Filled 2018-11-23 (×3): qty 200

## 2018-11-23 MED ORDER — ONDANSETRON HCL 4 MG/2ML IJ SOLN: 4.0000 mg | Freq: Four times a day (QID) | INTRAMUSCULAR | Status: DC | PRN

## 2018-11-23 MED ORDER — AMLODIPINE BESYLATE 5 MG PO TABS
5.0000 mg | ORAL_TABLET | Freq: Every day | ORAL | Status: DC
  Administered 2018-11-24 – 2018-12-10 (×16): 5 mg via ORAL
  Filled 2018-11-23 (×17): qty 1

## 2018-11-23 MED ORDER — METHOCARBAMOL 500 MG PO TABS
750.0000 mg | ORAL_TABLET | Freq: Three times a day (TID) | ORAL | Status: DC | PRN
  Filled 2018-11-23: qty 2

## 2018-11-23 MED ORDER — GABAPENTIN 300 MG PO CAPS
600.0000 mg | ORAL_CAPSULE | Freq: Three times a day (TID) | ORAL | Status: DC
  Administered 2018-11-23 – 2018-12-10 (×49): 600 mg via ORAL
  Filled 2018-11-23 (×49): qty 2

## 2018-11-23 MED ORDER — ACETAMINOPHEN 325 MG PO TABS
650.0000 mg | ORAL_TABLET | ORAL | Status: DC | PRN
  Filled 2018-11-23: qty 2

## 2018-11-23 MED ORDER — FLUCONAZOLE IN SODIUM CHLORIDE 400-0.9 MG/200ML-% IV SOLN
800.0000 mg | Freq: Once | INTRAVENOUS | Status: AC
  Administered 2018-11-23: 800 mg via INTRAVENOUS
  Filled 2018-11-23: qty 400

## 2018-11-23 MED ORDER — INSULIN ASPART 100 UNIT/ML ~~LOC~~ SOLN
0.0000 [IU] | Freq: Three times a day (TID) | SUBCUTANEOUS | Status: DC
  Administered 2018-11-24: 1 [IU] via SUBCUTANEOUS
  Administered 2018-11-24: 3 [IU] via SUBCUTANEOUS
  Administered 2018-11-25: 1 [IU] via SUBCUTANEOUS
  Administered 2018-12-03 – 2018-12-04 (×2): 2 [IU] via SUBCUTANEOUS
  Administered 2018-12-04 – 2018-12-06 (×3): 1 [IU] via SUBCUTANEOUS
  Administered 2018-12-06: 2 [IU] via SUBCUTANEOUS
  Administered 2018-12-08 (×2): 1 [IU] via SUBCUTANEOUS

## 2018-11-23 MED ORDER — IOHEXOL 300 MG/ML  SOLN
80.0000 mL | Freq: Once | INTRAMUSCULAR | Status: AC | PRN
  Administered 2018-11-23: 80 mL via INTRAVENOUS

## 2018-11-23 MED ORDER — FLUCONAZOLE IN SODIUM CHLORIDE 400-0.9 MG/200ML-% IV SOLN: 400.0000 mg | INTRAVENOUS | Status: DC

## 2018-11-23 MED ORDER — VITAMIN B-12 1000 MCG PO TABS
1000.0000 ug | ORAL_TABLET | Freq: Every day | ORAL | Status: DC
  Administered 2018-11-24 – 2018-12-10 (×16): 1000 ug via ORAL
  Filled 2018-11-23 (×16): qty 1

## 2018-11-23 MED ORDER — VITAMIN B-1 100 MG PO TABS
100.0000 mg | ORAL_TABLET | Freq: Every day | ORAL | Status: DC
  Administered 2018-11-24 – 2018-12-10 (×16): 100 mg via ORAL
  Filled 2018-11-23 (×16): qty 1

## 2018-11-23 MED ORDER — SPIRONOLACTONE 25 MG PO TABS
25.0000 mg | ORAL_TABLET | Freq: Every day | ORAL | Status: DC
  Administered 2018-11-24 – 2018-12-10 (×16): 25 mg via ORAL
  Filled 2018-11-23 (×16): qty 1

## 2018-11-23 MED ORDER — SODIUM CHLORIDE 0.9 % IV SOLN
2.0000 g | Freq: Two times a day (BID) | INTRAVENOUS | Status: DC
  Administered 2018-11-23 – 2018-11-24 (×3): 2 g via INTRAVENOUS
  Filled 2018-11-23 (×3): qty 2

## 2018-11-23 MED ORDER — CARBAMIDE PEROXIDE 6.5 % OT SOLN
5.0000 [drp] | Freq: Three times a day (TID) | OTIC | Status: DC
  Administered 2018-11-23 – 2018-12-09 (×34): 5 [drp] via OTIC
  Filled 2018-11-23: qty 15

## 2018-11-23 MED ORDER — IPRATROPIUM-ALBUTEROL 20-100 MCG/ACT IN AERS: 1.0000 | INHALATION_SPRAY | Freq: Four times a day (QID) | RESPIRATORY_TRACT | Status: DC

## 2018-11-23 MED ORDER — OXYCODONE HCL 5 MG PO TABS
5.0000 mg | ORAL_TABLET | Freq: Three times a day (TID) | ORAL | Status: DC | PRN
  Administered 2018-11-26 – 2018-11-28 (×3): 5 mg via ORAL
  Filled 2018-11-23 (×3): qty 1

## 2018-11-23 MED ORDER — FLUTICASONE PROPIONATE 50 MCG/ACT NA SUSP
1.0000 | Freq: Two times a day (BID) | NASAL | Status: DC | PRN
  Administered 2018-11-29 (×2): 1 via NASAL
  Filled 2018-11-23: qty 16

## 2018-11-23 MED ORDER — SACUBITRIL-VALSARTAN 97-103 MG PO TABS
1.0000 | ORAL_TABLET | Freq: Two times a day (BID) | ORAL | Status: DC
  Administered 2018-11-23 – 2018-12-10 (×33): 1 via ORAL
  Filled 2018-11-23 (×35): qty 1

## 2018-11-23 MED ORDER — PANTOPRAZOLE SODIUM 40 MG PO TBEC
40.0000 mg | DELAYED_RELEASE_TABLET | Freq: Every day | ORAL | Status: DC
  Administered 2018-11-24 – 2018-12-10 (×16): 40 mg via ORAL
  Filled 2018-11-23 (×16): qty 1

## 2018-11-23 MED ORDER — SODIUM CHLORIDE 0.9 % IV SOLN
INTRAVENOUS | Status: DC | PRN
  Administered 2018-11-23: 250 mL via INTRAVENOUS

## 2018-11-23 MED ORDER — ATORVASTATIN CALCIUM 10 MG PO TABS
20.0000 mg | ORAL_TABLET | Freq: Every day | ORAL | Status: DC
  Administered 2018-11-24 – 2018-12-10 (×16): 20 mg via ORAL
  Filled 2018-11-23 (×16): qty 2

## 2018-11-23 MED ORDER — ASPIRIN EC 81 MG PO TBEC
81.0000 mg | DELAYED_RELEASE_TABLET | Freq: Every day | ORAL | Status: DC
  Administered 2018-11-24 – 2018-12-10 (×16): 81 mg via ORAL
  Filled 2018-11-23 (×16): qty 1

## 2018-11-23 NOTE — Progress Notes (Addendum)
Pharmacy Antibiotic Note  Theodore Welch is a 58 y.o. 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  Pharmacy has been consulted for vancomycin and cefepime dosing. Pt developed chills and failed doxycycline over the weekend.   Plan: Cefepime 2g IV q12h Vancomycin 1500mg  IV x1 then 1000mg  IV q12h Follow cultures, ID recs, renal function     Temp (24hrs), Avg:98.4 F (36.9 C), Min:98.4 F (36.9 C), Max:98.4 F (36.9 C)  Recent Labs  Lab 11/23/18 0900  WBC 10.4  CREATININE 0.95    Estimated Creatinine Clearance: 86.4 mL/min (by C-G formula based on SCr of 0.95 mg/dL).    No Known Allergies  Antimicrobials this admission: Vancomycin 1/6 >> Cefepime 1/6 >>  Dose adjustments this admission: none  Microbiology results: 1/6 BCx: sent  Fredonia Highland, PharmD, BCPS Clinical Pharmacist [phone] Please check AMION for all [facility] Pharmacy numbers 11/23/2018  [facility] consulted for fluconazole dosing in setting of candidemia. Scr stable at 0.95 (CrCl 86 mL/min).   Plan: 1. Order fluconazole 800 mg (12 mg/kg) IV loading dose once 2. Order fluconazole 400 mg IV daily starting on 1/7  3. Monitor clinical picture to transition to oral formulation   Sherron Monday, PharmD, BCCCP Clinical Pharmacist  Pager: [phone] Phone: [phone]

## 2018-11-23 NOTE — H&P (Addendum)
Advanced Heart Failure VAD History and Physical Note   PCP-Cardiologist: No primary care provider on file.   Reason for Admission: Possible Driveline Infection   HPI:   58 yo with history of nonischemic cardiomyopathy, RLE DVT, cirrhosis, smoking, and OSA. .  Cardiomyopathy was diagnosed in 6/19 in [place], LHC/RHC at that time showed low output.  He was admitted to [facility] in 9/19 with low output HF and was started on milrinone and diuresed.  Unable to wean off milrinone.  He had a degree of RV failure, but this improved on milrinone.  Valvular heart disease also looked better with milrinone and diuresis.  On 08/03/18, he had Heartmate 3 LVAD placed.  Speed was optimized by ramp echo post-op.  Post-op course was relatively unremarkable.    He has been doing well until Saturday he says was sitting down watching TV and  felt a "pop" on his abdomen then had had lots of pus come out. His wife has been changing the driveline dressing every other day. He denies dropping his controller. He called the VAD coordinators and Dr Shirlee Latch then he was started on doxycyline . He started doxycyline twice a day on 1/4 and has had 5 doses. He has also had chills over the weekend but no fever. Appetite has been ok. No bleeding issues. Denies SOB.   LVAD INTERROGATION:  HeartMate II LVAD:  Flow 5 liters/min, speed 5700, power 4.5 PI 3. Having 30 PI events.   Review of Systems: [y] = yes, [ ]  = no   General: Weight gain [ ] ; Weight loss [ ] ; Anorexia [ ] ; Fatigue [Y ]; Fever [ ] ; Chills [ ] ; Weakness [Y ]  Cardiac: Chest pain/pressure [ ] ; Resting SOB [ ] ; Exertional SOB [ ] ; Orthopnea [ ] ; Pedal Edema [ ] ; Palpitations [ ] ; Syncope [ ] ; Presyncope [ ] ; Paroxysmal nocturnal dyspnea[ ]   Pulmonary: Cough [ ] ; Wheezing[ ] ; Hemoptysis[ ] ; Sputum [ ] ; Snoring [ ]   GI: Vomiting[ ] ; Dysphagia[ ] ; Melena[ ] ; Hematochezia [ ] ; Heartburn[ ] ; Abdominal pain [ ] ; Constipation [ ] ; Diarrhea [ ] ; BRBPR [ ]   GU: Hematuria[ ] ;  Dysuria [ ] ; Nocturia[ ]   Vascular: Pain in legs with walking [ ] ; Pain in feet with lying flat [ ] ; Non-healing sores [ ] ; Stroke [ ] ; TIA [ ] ; Slurred speech [ ] ;  Neuro: Headaches[ ] ; Vertigo[ ] ; Seizures[ ] ; Paresthesias[ ] ;Blurred vision [ ] ; Diplopia [ ] ; Vision changes [ ]   Ortho/Skin: Arthritis [ ] ; Joint pain [ ] ; Muscle pain [ ] ; Joint swelling [ ] ; Back Pain [ ] ; Rash [ ]   Psych: Depression[ ] ; Anxiety[ ]   Heme: Bleeding problems [ ] ; Clotting disorders [ ] ; Anemia [ ]   Endocrine: Diabetes [Y ]; Thyroid dysfunction[ ]     Home Medications Prior to Admission medications   Medication Sig Start Date End Date Taking? Authorizing Provider  amLODipine (NORVASC) 5 MG tablet Take 1 tablet (5 mg total) by mouth daily. 10/14/18   Laurey Morale, MD  aspirin EC 81 MG tablet Take 81 mg by mouth daily.    [provider]  atorvastatin (LIPITOR) 20 MG tablet Take 20 mg by mouth daily.     [provider]  carbamide peroxide (DEBROX) 6.5 % otic solution Place 5 drops into both ears 3 (three) times daily. 5-10 drops    [provider]  clotrimazole (LOTRIMIN) 1 % external solution Apply 1 application topically daily.    [provider]  cyanocobalamin  500 MCG tablet Take 1,000 mcg by mouth daily.     [provider]  docusate sodium (COLACE) 100 MG capsule Take 2 capsules (200 mg total) by mouth daily as needed for mild constipation. 08/12/18   Clegg, Amy D, NP  doxycycline (VIBRAMYCIN) 100 MG capsule Take 1 capsule (100 mg total) by mouth 2 (two) times daily for 10 days. 11/21/18 12/01/18  Laurey Morale, MD  DULoxetine (CYMBALTA) 60 MG capsule Take 60 mg by mouth daily.    [provider]  enoxaparin (LOVENOX) 80 MG/0.8ML injection Inject 0.8 mLs (80 mg total) into the skin every 12 (twelve) hours. 10/28/18   Graciella Freer, PA-C  fluticasone (FLONASE) 50 MCG/ACT nasal spray Place 1 spray into both nostrils 2 (two) times daily as needed  for allergies.     [provider]  furosemide (LASIX) 40 MG tablet Take 0.5 tablets (20 mg total) by mouth as needed. 09/02/18   Laurey Morale, MD  gabapentin (NEURONTIN) 300 MG capsule Take 600 mg by mouth 3 (three) times daily.    [provider]  hydrOXYzine (VISTARIL) 25 MG capsule Take 25 mg by mouth 3 (three) times daily as needed for anxiety.    [provider]  Ipratropium-Albuterol (COMBIVENT) 20-100 MCG/ACT AERS respimat Inhale 1 puff into the lungs every 6 (six) hours.    [provider]  metFORMIN (GLUCOPHAGE-XR) 500 MG 24 hr tablet Take 500 mg by mouth 2 (two) times daily.    [provider]  methocarbamol (ROBAXIN) 750 MG tablet Take 750 mg by mouth 3 (three) times daily as needed for muscle spasms.  02/21/14   [provider]  nicotine (NICODERM CQ - DOSED IN MG/24 HR) 7 mg/24hr patch Place 1 patch (7 mg total) onto the skin daily as needed (tobacco craving). 08/12/18   Clegg, Amy D, NP  nystatin (MYCOSTATIN/NYSTOP) powder Use with VAD dressing changes daily, advance as directed. 10/28/18   Clegg, Amy D, NP  omeprazole (PRILOSEC) 20 MG capsule Take 20 mg by mouth daily.    [provider]  oxyCODONE (OXY IR/ROXICODONE) 5 MG immediate release tablet Take 1 tablet (5 mg total) by mouth every 8 (eight) hours as needed for severe pain. 08/12/18   Clegg, Amy D, NP  polyethylene glycol (MIRALAX / GLYCOLAX) packet Take 17 g by mouth daily as needed for mild constipation.    [provider]  sacubitril-valsartan (ENTRESTO) 97-103 MG Take 1 tablet by mouth 2 (two) times daily. 10/28/18   Laurey Morale, MD  sildenafil (VIAGRA) 100 MG tablet Take 50 mg by mouth daily as needed for erectile dysfunction.    [provider]  spironolactone (ALDACTONE) 25 MG tablet Take 1 tablet (25 mg total) by mouth daily. 10/14/18   Laurey Morale, MD  thiamine 100 MG tablet Take 100 mg by mouth daily.    [provider]    traMADol (ULTRAM) 50 MG tablet Take 1 tablet (50 mg total) by mouth every 4 (four) hours as needed for moderate pain. 08/12/18   Tonye Becket D, NP  warfarin (COUMADIN) 5 MG tablet Take 1 and 1/2 tablets (7.5 mg) daily except 1 tablet (5 mg) on Monday, Wednesday and Friday    [provider]    Past Medical History: Past Medical History:  Diagnosis Date  . Cardiomyopathy, unspecified (HCC)   . CHF (congestive heart failure) (HCC)   . Chronic back pain   . Enlarged heart   . Gastric  ulcer   . Gastroenteritis   . H/O degenerative disc disease     Past Surgical History: Past Surgical History:  Procedure Laterality Date  . INSERTION OF IMPLANTABLE LEFT VENTRICULAR ASSIST DEVICE N/A 08/03/2018   Procedure: INSERTION OF IMPLANTABLE LEFT VENTRICULAR ASSIST DEVICE - HM3;  Surgeon: Alleen Borne, MD;  Location: [facility] OR;  Service: Open Heart Surgery;  Laterality: N/A;  HM3  . LUMBAR LAMINECTOMY/DECOMPRESSION MICRODISCECTOMY    . ORCHIECTOMY    . RIGHT HEART CATH N/A 07/24/2018   Procedure: RIGHT HEART CATH;  Surgeon: Laurey Morale, MD;  Location: [facility] INVASIVE CV LAB;  Service: Cardiovascular;  Laterality: N/A;  . TEE WITHOUT CARDIOVERSION N/A 08/03/2018   Procedure: TRANSESOPHAGEAL ECHOCARDIOGRAM (TEE);  Surgeon: Alleen Borne, MD;  Location: [facility] OR;  Service: Open Heart Surgery;  Laterality: N/A;    Family History: No family history of coronary disease of heart failure. Social History: Social History   Socioeconomic History  . Marital status: Single    Spouse name: Not on file  . Number of children: 7  . Years of education: 9th  . Highest education level: 9th grade  Occupational History  . Occupation: disabled  Social Needs  . Financial resource strain: Hard  . Food insecurity:    Worry: Sometimes true    Inability: Sometimes true  . Transportation needs:    Medical: No    Non-medical: No  Tobacco Use  . Smoking status: Current Every Day Smoker    Types: Cigarettes     Start date: 2003  . Smokeless tobacco: Never Used  . Tobacco comment: Nicoderm patch   Substance and Sexual Activity  . Alcohol use: Not Currently  . Drug use: Not Currently  . Sexual activity: Not on file  Lifestyle  . Physical activity:    Days per week: Not on file    Minutes per session: Not on file  . Stress: Not on file  Relationships  . Social connections:    Talks on phone: Not on file    Gets together: Not on file    Attends religious service: Not on file    Active member of club or organization: Not on file    Attends meetings of clubs or organizations: Not on file    Relationship status: Not on file  Other Topics Concern  . Not on file  Social History Narrative  . Not on file    Allergies:  No Known Allergies  Objective:    Vital Signs:   BP: ()/()  Arterial Line BP: ()/()    There were no vitals filed for this visit.  Mean arterial Pressure 90   Physical Exam    General:   No resp difficulty HEENT: Normal Neck: supple. JVP 5-6 . Carotids 2+ bilat; no bruits. No lymphadenopathy or thyromegaly appreciated. Cor: Mechanical heart sounds with LVAD hum present. Lungs: Clear Abdomen: soft, tender, nondistended. No hepatosplenomegaly. No bruits or masses. Good bowel sounds. Indurated around the driveline. Copious exudate around to right of the driveline.  Driveline: C/D/I; securement device intact and driveline incorporated. No tunneling around the driveline.  Extremities: no cyanosis, clubbing, rash, edema Neuro: alert & orientedx3, cranial nerves grossly intact. moves all 4 extremities w/o difficulty. Affect pleasant   Telemetry   SR 90s   EKG  None    Labs    Basic Metabolic Panel: No results for input(s): NA, K, CL, CO2, GLUCOSE, BUN, CREATININE, CALCIUM, MG, PHOS in the last 168 hours.  Liver  Function Tests: No results for input(s): AST, ALT, ALKPHOS, BILITOT, PROT, ALBUMIN in the last 168 hours. No results for input(s): LIPASE, AMYLASE  in the last 168 hours. No results for input(s): AMMONIA in the last 168 hours.  CBC: Recent Labs  Lab 11/23/18 0900  WBC 10.4  HGB 11.6*  HCT 39.9  MCV 84.2  PLT 312    Cardiac Enzymes: No results for input(s): CKTOTAL, CKMB, CKMBINDEX, TROPONINI in the last 168 hours.  BNP: BNP (last 3 results) Recent Labs    07/21/18 0540 08/04/18 0902 08/10/18 0026  BNP 1,343.9* 658.6* 537.0*    ProBNP (last 3 results) No results for input(s): PROBNP in the last 8760 hours.   CBG: Recent Labs  Lab 11/17/18 0835  GLUCAP 112*    Coagulation Studies: Recent Labs    11/23/18 0900  LABPROT 18.5*  INR 1.56    Other results: EKG:   Imaging    No results found.    Patient Profile:   58 yo with history of nonischemic cardiomyopathy, RLE DVT, cirrhosis, smoking, OSA, and HMIII LVAD 07/2018.   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  Assessment/Plan:    1. 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  Wound culture obtained by Dr Maren BeachVantrigt in the VAD clinic. Blood CX x2 obtained.  Send for CT. Start vancomycin and cefepime. Consult ID.  2. 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  LVAD parameters stable except for frequent PI events (no low flows).   - He does not need Lasix.      - Continue spironolactone 25 mg daily.  - Continue Entresto to 97/103 bid  - Continue amlodipine 5 mg daily. - Continue ASA 81 daily.  - Continue warfarin for INR 2-2.5. Hold coumadin  -INR 1.5. Start heparin drip. Discussed with pharmacy.   2. Smoking: Still smoking a few cigs/day. Using nicotine patches.  Knows he needs to quit to be a transplant candidate.  3. RLE DVT: Start heparin 4. OSA: Continue CPAP. No change. 5. Hyperlipidemia: Atorvastatin.  6. Type II diabetes: Metformin. Start sliding scale.   7. HTN Stable. Continue home regimen.  8. Rash - ?Fungal infection under driveline dressing  Admit to 2C. Consult CT surgery and ID.   I  reviewed the LVAD parameters from today, and compared the results to the patient's prior recorded data.  No programming changes were made.  The LVAD is functioning within specified parameters.  The patient performs LVAD self-test daily.  LVAD interrogation was negative for any significant power changes, alarms or PI events/speed drops.  LVAD equipment check completed and is in good working order.  Back-up equipment present.   LVAD education done on emergency procedures and precautions and reviewed exit site care.  Length of Stay: 0  Tonye BecketAmy Clegg, NP 11/23/2018, 10:20 AM  VAD Team Pager [phone] (7am - 7am) +++VAD ISSUES ONLY+++   Advanced Heart Failure Team Pager [phone] (M-F; 7a - 4p)  Please contact [facility] for night-coverage after hours (4p -7a ) and weekends on [email or web address] for all non- LVAD Issues  Patient seen with NP, agree with the above note.   Patient has developed an infection at a site near his driveline with tracking back towards the driveline. Pus was expressed.  He is afebrile but had some chills over the weekend.  LVAD parameters stable.    On exam, no JVD.  Driveline infection as above. Clear lungs.  No edema.   We will admit Mr Garey HamMcClendon today.  He will  be started on vancomycin and cefepime for infection after blood cultures are drawn.  He will need CT abdomen/pelvis.  Dr. Donata Clay has seen, he will likely need debridement this admission. I will ask ID to see him.    Warfarin to be held for debridement.  INR 1.5 today so will start IV heparin gtt.   Marca Ancona 11/23/2018 12:31 PM

## 2018-11-23 NOTE — Progress Notes (Signed)
[facility]  [facility] Infusion Coordinator will follow pt with ID team to support Home Infusion Pharmacy services at [place] if home IV ABX are needed.  If patient discharges after hours, please call [phone].   Sedalia Muta 11/23/2018, 6:23 PM

## 2018-11-23 NOTE — Progress Notes (Signed)
Patient presents for sick visit in VAD Clinic today with significant other [name]. Reports no problems with VAD equipment. No episodes of dizziness or falling.   Pt paged Saturday night with issues from old abdominal incision post VAD implant (see note.)   Vital Signs:  Doppler Pressure:  78 Automatc BP:  82/42 (60) HR: 61 SPO2: 99%  Weight: 162 lb w/o eqt Last weight: 166.2 lb Home weights: 157 - 164 lbs   VAD Indication: Destination Therapy due to smoking status  VAD interrogation & Equipment Management: Speed: 5700 Flow: 5.1 Power:  4.4w    PI: 2.5 Hct: 39  Alarms: no clinical alarms Events:  30 - 40 PI events  Fixed speed: 5700 Low speed limit: 5400  Primary Controller:  Replace back up battery in 27 months. Back up controller:   Replace back up battery in 27 months.  Annual Equipment Maintenance on UBC/PM was performed on 07/2018.   I reviewed the LVAD parameters from today and compared the results to the patient's prior recorded data. LVAD interrogation was NEGATIVE for significant power changes, NEGATIVE for clinical alarms and STABLE for PI events/speed drops. No programming changes were made and pump is functioning within specified parameters. Pt is performing daily controller and system monitor self tests along with completing weekly and monthly maintenance for LVAD equipment.  LVAD equipment check completed and is in good working order. Back-up equipment present.   Exit Site Care: Drive line is being maintained [employer].  Existing VAD dressing removed and site care performed using sterile technique.Increased area of skin irritation noted that is worse today. [name] states that she has been using Nystatin powder to the area. Dr. Donata Clay in to assess site. Drive line exit site cleaned with sterile saline only; allowed to dry and gauze dressing with silver strip re-applied. Exit site incorporated, the velour is fully implanted at exit site.  Raised, itchy patches underneath dressing and anchor noted. Exit site with small amount yellow drainage;  no redness, tenderness, or foul odor noted. Drive line anchor replaced x 2. Pt denies fever or chills.    Upon assesment of abdomen pt has redness and cellutis. Large amount of bloody drainage is coming from the old incision. There is a hard area about the size of baseball between the driveline and opening of the old incision. There is scant amount of drainage from the actual drive line site. The abdominal incision tunnels approx 2-3 cm. This was cultured in clinic today. Redness marked on abdomen.       BP & Labs: Doppler 78 and is reflective of Modified systolic.   Hgb 11.6 - No S/S of bleeding. Specifically denies melena/BRBPR or nosebleeds.  LDH stable at 165 with established baseline of 175 - 255. Denies tea-colored urine. No power elevations noted on interrogation.     Patient Instructions: 1. Admit pt for IV antibiotics and possible wound debridement pending CT results.  [name], RN VAD Coordinator  Office: [phone]  24/7 Pager: [phone]

## 2018-11-23 NOTE — Progress Notes (Addendum)
  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  He had a short hospital stay after surgery and was discharged home with all incisions clean and dry. Over the weekend he developed bloody drainage from the periumbilical counterincision for the HeartMate 3 power cord.  He was started on oral antibiotic (doxycycline) and presented today to the clinic.  On exam there is a 2 cm skin opening of the mid abdominal incision.  This was prepped sterilely and cultures were obtained.  The depth of the wound is down to the rectus sheath, approximately 3 cm.  There is no tunneling towards the power cord exit site, no tunneling towards the pump pocket.  The wound was cleaned and packed with a aqua cell strip and this will be formed on a daily basis.  He also need broad-spectrum IV antibiotics until culture results are final.  His VAD parameters are in satisfactory range.  His anticoagulation is less than 2.0 INR.  Would recommend using heparin for anticoagulation while he is on IV antibiotics and we are observing the wound in case he needs a surgical procedure later in the week.  Objective: Vital signs in last 24 hours: Temp:  [98.4 F (36.9 C)] 98.4 F (36.9 C) (01/06 1018) Pulse Rate:  [61] 61 (01/06 1022) BP: (78-82)/(0-42) 82/42 (01/06 1022) Weight:  [73.5 kg] 73.5 kg (01/06 1018)  Hemodynamic parameters for last 24 hours:    Intake/Output from previous day: No intake/output data recorded. Intake/Output this shift: No intake/output data recorded. Exam Alert and comfortable No JVD Lungs clear Sternal incision well-healed nontender and stable No tenderness over the pump pocket proximal driveline Heart rhythm regular, normal VAD hum No pedal edema Neuro intact Lab Results: Recent Labs    11/23/18 0900  WBC 10.4  HGB 11.6*  HCT 39.9  PLT 312   BMET:  No results for input(s): NA, K, CL, CO2, GLUCOSE, BUN, CREATININE, CALCIUM in the last 72 hours.  PT/INR:  Recent Labs    11/23/18 0900  LABPROT 18.5*  INR 1.56   ABG    Component Value Date/Time   PHART 7.473 (H) 08/05/2018 0345   HCO3 26.9 08/05/2018 0345   TCO2 26 08/04/2018 1622   O2SAT 73.6 08/12/2018 0345   CBG (last 3)  No results for input(s): GLUCAP in the last 72 hours.  Assessment/Plan: S/P HeartMate 3 LVAD implantation September 2019 Superficial abdominal wound involving driveline tunnel   Admit for IV antibiotics and daily wound care Use heparin for anticoagulation until we are sure he will not need surgical debridement and wound VAC therapy.  CT scan of chest and abdomen is pending.  LOS: 0 days    Theodore Welch 11/23/2018

## 2018-11-23 NOTE — Consult Note (Signed)
[facility]    Date of Admission:  11/23/2018    Total days of antibiotics 3        Day 1 vancomycin        Day 1 cefepime              Reason for Consult: Wound infection following LVAD implantation    Referring Provider: Tonye Becket, NP  Assessment: He has developed an acute infection at a previous incision site.  When he was examined this morning in clinic it tunneling down to the rectus sheath but did not appear to connect with the pump or driveline.  Drainage Gram stain shows gram-positive cocci.  I favor continuing both vancomycin and cefepime pending final cultures.  Plan: 1. Continue current antibiotics pending final cultures  Principal Problem:   Complication involving left ventricular assist device (LVAD) Active Problems:   HTN (hypertension)   Tobacco use   Congestive heart failure (HCC)   DVT (deep venous thrombosis) (HCC)   OSA (obstructive sleep apnea)   Scheduled Meds: . amLODipine  5 mg Oral Daily  . aspirin EC  81 mg Oral Daily  . atorvastatin  20 mg Oral Daily  . carbamide peroxide  5 drop Both EARS TID  . DULoxetine  60 mg Oral Daily  . gabapentin  600 mg Oral TID  . insulin aspart  0-9 Units Subcutaneous TID WC  . Ipratropium-Albuterol  1 puff Inhalation Q6H  . metFORMIN  500 mg Oral BID  . nystatin   Topical BID  . pantoprazole  40 mg Oral Daily  . sacubitril-valsartan  1 tablet Oral BID  . spironolactone  25 mg Oral Daily  . thiamine  100 mg Oral Daily  . vitamin B-12  1,000 mcg Oral Daily   Continuous Infusions: . sodium chloride 250 mL (11/23/18 1622)  . ceFEPime (MAXIPIME) IV 2 g (11/23/18 1623)  . [START ON 11/24/2018] fluconazole (DIFLUCAN) IV    . fluconazole (DIFLUCAN) IV    . heparin    . vancomycin    . [START ON 11/24/2018] vancomycin     PRN Meds:.sodium chloride, acetaminophen, diphenhydrAMINE, fluticasone, hydrOXYzine, methocarbamol, ondansetron (ZOFRAN) IV, oxyCODONE, traMADol  HPI: Theodore Welch is a 58 y.o. male who underwent LVAD placement for cardiomyopathy several months ago.  He did well until 3 days ago when his wife noted some swelling over his mid abdomen at the site of the previous incision.  This was medial to his driveline exit site.  It was not causing any pain and he had not had any fever, chills or sweats.  He did not think anything of it until he suddenly felt a "pop" and noticed brown, purulent drainage from the site 2 days ago.  He was started on empiric doxycycline and seen in clinic this morning.  There was copious purulent drainage from the site leading to his admission.   Review of Systems: Review of Systems  Constitutional: Negative for chills, diaphoresis, fever and malaise/fatigue.    Past Medical History:  Diagnosis Date  . Cardiomyopathy, unspecified (HCC)   . CHF (congestive heart failure) (HCC)   . Chronic back pain   . Enlarged heart   . Gastric ulcer   . Gastroenteritis   . H/O degenerative disc disease     Social History   Tobacco Use  . Smoking status: Current Every Day Smoker    Types: Cigarettes    Start date: 2003  .  Smokeless tobacco: Never Used  . Tobacco comment: Nicoderm patch   Substance Use Topics  . Alcohol use: Not Currently  . Drug use: Not Currently    No family history on file. No Known Allergies  OBJECTIVE: There were no vitals taken for this visit.  Physical Exam Constitutional:      Comments: He is resting quietly in bed.  He is talkative and in good spirits.  Cardiovascular:     Comments: LVAD hum noted. Chest:       Lab Results Lab Results  Component Value Date   WBC 10.4 11/23/2018   HGB 11.6 (L) 11/23/2018   HCT 39.9 11/23/2018   MCV 84.2 11/23/2018   PLT 312 11/23/2018    Lab Results  Component Value Date   CREATININE 0.95 11/23/2018   BUN 11 11/23/2018   NA 138 11/23/2018   K 4.0 11/23/2018   CL 105 11/23/2018   CO2 18 (L) 11/23/2018    Lab Results  Component Value Date   ALT  12 10/28/2018   AST 22 10/28/2018   ALKPHOS 149 (H) 10/28/2018   BILITOT 0.6 10/28/2018     Microbiology: Recent Results (from the past 240 hour(s))  Culture, blood (routine x 2)     Status: None (Preliminary result)   Collection Time: 11/23/18  9:13 AM  Result Value Ref Range Status   Specimen Description BLOOD RIGHT FOREARM  Final   Special Requests   Final    BOTTLES DRAWN AEROBIC AND ANAEROBIC Blood Culture adequate volume Performed at [facility], [address] [place], [place] [address]    Culture NO GROWTH < 12 HOURS  Final   Report Status PENDING  Incomplete  Culture, blood (Routine X 2) w Reflex to ID Panel     Status: None (Preliminary result)   Collection Time: 11/23/18  9:17 AM  Result Value Ref Range Status   Specimen Description BLOOD LEFT FOREARM  Final   Special Requests   Final    BOTTLES DRAWN AEROBIC AND ANAEROBIC Blood Culture adequate volume Performed at [facility], [address] [place], [place] [address]    Culture NO GROWTH < 12 HOURS  Final   Report Status PENDING  Incomplete  Aerobic Culture (superficial specimen)     Status: None (Preliminary result)   Collection Time: 11/23/18 10:27 AM  Result Value Ref Range Status   Specimen Description WOUND LVAD  Final   Special Requests NONE  Final   Gram Stain   Final    RARE WBC PRESENT, PREDOMINANTLY PMN RARE GRAM POSITIVE COCCI Performed at [facility], [address] [place], [place] [address]    Culture PENDING  Incomplete   Report Status PENDING  Incomplete    Cliffton AstersJohn Sly Parlee, MD [facility] [phone] pager   [phone] cell 11/23/2018, 5:04 PM

## 2018-11-23 NOTE — Progress Notes (Signed)
ANTICOAGULATION CONSULT NOTE - Initial Consult  Pharmacy Consult for heparin Indication: LVAD  No Known Allergies  Patient Measurements:   Heparin Dosing Weight: 73kg  Vital Signs: Temp: 98.4 F (36.9 C) (01/06 1018) BP: 82/42 (01/06 1022) Pulse Rate: 61 (01/06 1022)  Labs: Recent Labs    11/23/18 0900  HGB 11.6*  HCT 39.9  PLT 312  LABPROT 18.5*  INR 1.56  CREATININE 0.95    Estimated Creatinine Clearance: 86.4 mL/min (by C-G formula based on SCr of 0.95 mg/dL).   Medical History: Past Medical History:  Diagnosis Date  . Cardiomyopathy, unspecified (HCC)   . CHF (congestive heart failure) (HCC)   . Chronic back pain   . Enlarged heart   . Gastric ulcer   . Gastroenteritis   . H/O degenerative disc disease     Assessment: 9 yoM with LVAD admitted with driveline infection. INR subtherapeutic on admit, will start heparin and hold warfarin in case procedures are needed.  Goal of Therapy:  Heparin level 0.3-0.7 units/ml Monitor platelets by anticoagulation protocol: Yes   Plan:  -Heparin 1300 units/hr -Check 6hr heparin level -Daily INR, LDH, CBC, heparin level  Fredonia Highland, PharmD, BCPS Clinical Pharmacist [phone] Please check AMION for all [facility] numbers 11/23/2018

## 2018-11-24 ENCOUNTER — Ambulatory Visit (HOSPITAL_COMMUNITY): Payer: Medicare Other

## 2018-11-24 ENCOUNTER — Telehealth (HOSPITAL_COMMUNITY): Payer: Self-pay

## 2018-11-24 ENCOUNTER — Other Ambulatory Visit: Payer: Self-pay

## 2018-11-24 ENCOUNTER — Encounter (HOSPITAL_COMMUNITY): Payer: Self-pay

## 2018-11-24 DIAGNOSIS — Z95811 Presence of heart assist device: Secondary | ICD-10-CM

## 2018-11-24 DIAGNOSIS — R21 Rash and other nonspecific skin eruption: Secondary | ICD-10-CM

## 2018-11-24 DIAGNOSIS — T829XXD Unspecified complication of cardiac and vascular prosthetic device, implant and graft, subsequent encounter: Secondary | ICD-10-CM

## 2018-11-24 DIAGNOSIS — B9561 Methicillin susceptible Staphylococcus aureus infection as the cause of diseases classified elsewhere: Secondary | ICD-10-CM

## 2018-11-24 DIAGNOSIS — T827XXA Infection and inflammatory reaction due to other cardiac and vascular devices, implants and grafts, initial encounter: Principal | ICD-10-CM

## 2018-11-24 DIAGNOSIS — I5043 Acute on chronic combined systolic (congestive) and diastolic (congestive) heart failure: Secondary | ICD-10-CM

## 2018-11-24 LAB — CBC
HCT: 34.4 % — ABNORMAL LOW (ref 39.0–52.0)
Hemoglobin: 10.7 g/dL — ABNORMAL LOW (ref 13.0–17.0)
MCH: 25.1 pg — ABNORMAL LOW (ref 26.0–34.0)
MCHC: 31.1 g/dL (ref 30.0–36.0)
MCV: 80.6 fL (ref 80.0–100.0)
Platelets: 333 10*3/uL (ref 150–400)
RBC: 4.27 MIL/uL (ref 4.22–5.81)
RDW: 17 % — ABNORMAL HIGH (ref 11.5–15.5)
WBC: 10 10*3/uL (ref 4.0–10.5)
nRBC: 0 % (ref 0.0–0.2)

## 2018-11-24 LAB — GLUCOSE, CAPILLARY
GLUCOSE-CAPILLARY: 66 mg/dL — AB (ref 70–99)
Glucose-Capillary: 101 mg/dL — ABNORMAL HIGH (ref 70–99)
Glucose-Capillary: 121 mg/dL — ABNORMAL HIGH (ref 70–99)
Glucose-Capillary: 213 mg/dL — ABNORMAL HIGH (ref 70–99)
Glucose-Capillary: 141 mg/dL — ABNORMAL HIGH (ref 70–99)

## 2018-11-24 LAB — BASIC METABOLIC PANEL
Anion gap: 10 (ref 5–15)
BUN: 8 mg/dL (ref 6–20)
CO2: 20 mmol/L — ABNORMAL LOW (ref 22–32)
Calcium: 9.1 mg/dL (ref 8.9–10.3)
Chloride: 109 mmol/L (ref 98–111)
Creatinine, Ser: 0.87 mg/dL (ref 0.61–1.24)
GFR calc non Af Amer: >60 mL/min (ref >60)
Glucose, Bld: 110 mg/dL — ABNORMAL HIGH (ref 70–99)
Potassium: 3.5 mmol/L (ref 3.5–5.1)
SODIUM: 139 mmol/L (ref 135–145)

## 2018-11-24 LAB — LACTATE DEHYDROGENASE: LDH: 127 U/L (ref 98–192)

## 2018-11-24 LAB — PROTIME-INR
INR: 1.61
Prothrombin Time: 18.9 seconds — ABNORMAL HIGH (ref 11.4–15.2)

## 2018-11-24 LAB — HEPARIN LEVEL (UNFRACTIONATED): Heparin Unfractionated: 0.2 IU/mL — ABNORMAL LOW (ref 0.30–0.70)

## 2018-11-24 MED ORDER — FLUCONAZOLE 100 MG PO TABS
100.0000 mg | ORAL_TABLET | Freq: Every day | ORAL | Status: DC
  Administered 2018-11-24 – 2018-11-27 (×4): 100 mg via ORAL
  Filled 2018-11-24 (×5): qty 1

## 2018-11-24 MED ORDER — IPRATROPIUM-ALBUTEROL 0.5-2.5 (3) MG/3ML IN SOLN: 3.0000 mL | RESPIRATORY_TRACT | Status: DC | PRN

## 2018-11-24 MED ORDER — WARFARIN SODIUM 4 MG PO TABS
4.0000 mg | ORAL_TABLET | Freq: Once | ORAL | Status: AC
  Administered 2018-11-24: 4 mg via ORAL
  Filled 2018-11-24: qty 1

## 2018-11-24 MED ORDER — FLUCONAZOLE 200 MG PO TABS
200.0000 mg | ORAL_TABLET | Freq: Every day | ORAL | Status: DC
  Filled 2018-11-24: qty 1

## 2018-11-24 MED ORDER — WARFARIN - PHARMACIST DOSING INPATIENT: Freq: Every day | Status: DC

## 2018-11-24 MED ORDER — WARFARIN SODIUM 4 MG PO TABS: 4.0000 mg | ORAL_TABLET | Freq: Once | ORAL | Status: DC

## 2018-11-24 MED ORDER — SODIUM CHLORIDE 0.9 % IV SOLN
2.0000 g | Freq: Three times a day (TID) | INTRAVENOUS | Status: DC
  Filled 2018-11-24: qty 2

## 2018-11-24 NOTE — Telephone Encounter (Signed)
Pt called to state he is currently in the hospital.

## 2018-11-24 NOTE — Progress Notes (Signed)
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 Shirlee Latch.   HM III LVAD implanted on 08/03/18 by Dr. Laneta Simmers under Destination Therapy criteria due to smoking status.  Pt awake, eating food from home. Reports Amy Clegg, NP has already been in and changed his DL dressing using silk tape. He says itching has been somewhat relieved by prn Benadryl.  After walking the halls, pt called VAD coordinator to room - dressing is coming off, silk tape not sticking, skin under dressing is wet. Re-dressed using gauze and ABD dressing, skin prep; secured with paper tape.   Vital signs: Temp: 97.8  HR: 84 Doppler Pressure: Automatic BP:  O2 Sat: 100% on RA Wt:  157 lbs   LVAD interrogation reveals:  Speed: 5700 Flow: 4.3 Power:  4.5w PI: 4.6 Alarms: none Events:  30 PI events on 11/23/17 Hematocrit: 39  Fixed speed: 5700 Low speed limit:  5400   Drive Line and periumbilical counterincision: changed per Tonye Becket, NP; anchor intact and accurately applied. Pt requires daily dressing changes. Reinforced by VAD Coordinator - see above note.   Labs:  LDH trend: 165>127  INR trend: 1.56>1.61  Anticoagulation Plan: -INR Goal:  2.0 - 2.5  -ASA Dose:  81 mg daily - Coumadin on hold for possible wound debridement in OR later this week   Device: N/A - no ICD   Infection:  - BC's x 2 11/23/18>>NTD - LVAD Wound culture 11/24/18>>preliminary rare gram pos cocci  Adverse Events on VAD:   Plan/Recommendations:  1. Contact VAD Coordinator for any VAD equipment or driveline issues.  Hessie Diener RN, VAD Coordinator 24/7 VAD Pager: [phone]

## 2018-11-24 NOTE — Progress Notes (Signed)
ANTICOAGULATION CONSULT NOTE - Follow-Up Consult  Pharmacy Consult for Warfarin restart (Remains on IV Heparin) Indication: LVAD  No Known Allergies  Patient Measurements: Weight: 157 lb 3 oz (71.3 kg)(Scale B) Heparin Dosing Weight: 73kg  Vital Signs: Temp: 98.1 F (36.7 C) (01/07 1500) Temp Source: Oral (01/07 1500) BP: 98/71 (01/07 1635) Pulse Rate: 85 (01/07 1500)  Labs: Recent Labs    11/23/18 0900 11/23/18 2204 11/24/18 0223 11/24/18 0918  HGB 11.6*  --  10.7*  --   HCT 39.9  --  34.4*  --   PLT 312  --  333  --   LABPROT 18.5*  --  18.9*  --   INR 1.56  --  1.61  --   HEPARINUNFRC  --  0.11*  --  0.20*  CREATININE 0.95  --  0.87  --     Estimated Creatinine Clearance: 92.2 mL/min (by C-G formula based on SCr of 0.87 mg/dL).   Medical History: Past Medical History:  Diagnosis Date  . Cardiomyopathy, unspecified (HCC)   . CHF (congestive heart failure) (HCC)   . Chronic back pain   . Enlarged heart   . Gastric ulcer   . Gastroenteritis   . H/O degenerative disc disease     Assessment: 36 yoM with LVAD admitted with driveline infection. INR was subtherapeutic on admit so heparin started. Warfarin was initially held for possible procedures. No procedures planned now so order to resume Warfarin.   INR remains subtherapeutic but did bump up slightly with start of IV Fluconazole therapy which can interact with Warfarin. Will be cautious and start back at lower dose in setting of this interaction. *PTA Dose = 7.5mg  TTSS, 5mg  MWF. Last dose was 11/22/18.  Goal of Therapy:  Heparin level 0.3-0.7 units/ml INR 2-2.5 Monitor platelets by anticoagulation protocol: Yes   Plan:  -Restart Warfarin at lower dose of 4mg  po x1 tonight (lower than normal 7.5 due to interaction noted) -Daily PT/INR  Link Snuffer, PharmD, BCPS, BCCCP Clinical Pharmacist Clinical phone 11/24/2018 until 10:30PM- #[phone] Please check AMION for all [facility] Pharm[facility] numbers 11/24/2018

## 2018-11-24 NOTE — Progress Notes (Addendum)
Advanced Heart Failure VAD Team Note  PCP-Cardiologist: No primary care provider on file.   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 Placed on vancomycin and cefepime.  Having ongoing abdominal tenderness. Denies SOB.   Wound CX pending.  Blood CX pending.    LVAD INTERROGATION:  HeartMate III LVAD:   Flow 4.1 liters/min, speed 5700, power 4 PI 4.3  Objective:    Vital Signs:   Temp:  [98.1 F (36.7 C)-98.3 F (36.8 C)] 98.3 F (36.8 C) (01/07 0723) Pulse Rate:  [92-93] 92 (01/07 0723) Resp:  [16-28] 22 (01/07 0723) BP: (91-112)/(74-93) 99/82 (01/07 0723) SpO2:  [98 %-100 %] 100 % (01/07 0723) Weight:  [71.3 kg] 71.3 kg (01/07 0500)   Mean arterial Pressure 88   Intake/Output:   Intake/Output Summary (Last 24 hours) at 11/24/2018 0759 Last data filed at 11/24/2018 0600 Gross per 24 hour  Intake 1193.52 ml  Output -  Net 1193.52 ml     Physical Exam    General:   No resp difficulty HEENT: normal Neck: supple. JVP 5-6 . Carotids 2+ bilat; no bruits. No lymphadenopathy or thyromegaly appreciated. Cor: Mechanical heart sounds with LVAD hum present. Lungs: clear Abdomen: soft, tender, nondistended. No hepatosplenomegaly. No bruits or masses. Good bowel sounds. Driveline: C/D/I; securement device intact and driveline incorporated Extremities: no cyanosis, clubbing, rash, edema Neuro: alert & orientedx3, cranial nerves grossly intact. moves all 4 extremities w/o difficulty. Affect pleasant Skin: Wound to right of the driveline 3 cm with copious purulence. LLQ rash with satellite lesions.    Telemetry  NSR 90s    EKG    N/A  Labs   Basic Metabolic Panel: Recent Labs  Lab 11/23/18 0900 11/24/18 0223  NA 138 139  K 4.0 3.5  CL 105 109  CO2 18* 20*  GLUCOSE 250* 110*  BUN 11 8  CREATININE 0.95 0.87  CALCIUM 9.2 9.1    Liver Function Tests: No results for input(s): AST, ALT, ALKPHOS, BILITOT, PROT,  ALBUMIN in the last 168 hours. No results for input(s): LIPASE, AMYLASE in the last 168 hours. No results for input(s): AMMONIA in the last 168 hours.  CBC: Recent Labs  Lab 11/23/18 0900 11/24/18 0223  WBC 10.4 10.0  HGB 11.6* 10.7*  HCT 39.9 34.4*  MCV 84.2 80.6  PLT 312 333    INR: Recent Labs  Lab 11/23/18 0900 11/24/18 0223  INR 1.56 1.61    Other results:  EKG:    Imaging   Ct Abdomen Pelvis W Contrast  Result Date: 11/23/2018 CLINICAL DATA:  Left ventricular assist device placement 2 days ago with draining abdominal incision and low-grade fever. EXAM: CT ABDOMEN AND PELVIS WITH CONTRAST TECHNIQUE: Multidetector CT imaging of the abdomen and pelvis was performed using the standard protocol following bolus administration of intravenous contrast. CONTRAST:  80mL OMNIPAQUE IOHEXOL 300 MG/ML  SOLN COMPARISON:  CT 07/24/2018. FINDINGS: Lower chest: Interval placement of a left ventricular assist device. No intrathoracic complications are identified. There is stable cardiomegaly and no significant pericardial effusion. There is a small residual left pleural effusion and mild left lower lobe atelectasis. There are inflammatory changes and ill-defined air-fluid collection in the anterior abdominal wall inferior to the LVAD drive line in the left upper quadrant. This measures 2.9 x 3.0 cm maximally on image 42/5 and extend to the skin incision. There is no drainable fluid collection. Hepatobiliary: Interval resolved changes of chronic passive congestion  in the liver. No focal hepatic abnormality. The gallbladder is contracted. No evidence of gallstones, gallbladder wall thickening or biliary dilatation. Pancreas: No acute findings. 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 No pancreatic ductal dilatation or surrounding inflammation. Spleen: Normal in size without focal abnormality. Adrenals/Urinary Tract: Both adrenal glands appear normal. Stable small renal cysts. No evidence of urinary tract  calculus or hydronephrosis. The bladder appears normal for its degree of distention. Stomach/Bowel: No evidence of bowel wall thickening, distention or surrounding inflammatory change. The appendix appears normal. Vascular/Lymphatic: There are no enlarged abdominal or pelvic lymph nodes. Mild aortic and branch vessel atherosclerosis. No acute vascular findings. Reproductive: The prostate gland and seminal vesicles appear normal. Other: No ascites or intra-abdominal fluid collection. As above, small ill-defined fluid collection along the drive line in the subcutaneous fat of the left upper quadrant of the anterior abdominal wall. Musculoskeletal: No acute or significant osseous findings. Degenerative disc disease noted at L4-5. IMPRESSION: 1. Interval placement of left ventricular assist device. There are no complications identified within the chest. There is a small amount of inflammation and ill-defined fluid along the drive line in the anterior abdominal wall of the left upper quadrant, but no drainable fluid collection. 2. Interval resolution of anasarca. Small residual left pleural effusion and left lower lobe atelectasis. 3. Interval resolution of chronic passive congestion changes in the liver. 4. No acute abdominopelvic findings. Electronically Signed   By: Carey Bullocks M.D.   On: 11/23/2018 13:07      Medications:     Scheduled Medications: . amLODipine  5 mg Oral Daily  . aspirin EC  81 mg Oral Daily  . atorvastatin  20 mg Oral Daily  . carbamide peroxide  5 drop Both EARS TID  . DULoxetine  60 mg Oral Daily  . gabapentin  600 mg Oral TID  . insulin aspart  0-9 Units Subcutaneous TID WC  . metFORMIN  500 mg Oral BID WC  . nystatin   Topical BID  . pantoprazole  40 mg Oral Daily  . sacubitril-valsartan  1 tablet Oral BID  . spironolactone  25 mg Oral Daily  . thiamine  100 mg Oral Daily  . vitamin B-12  1,000 mcg Oral Daily     Infusions: . sodium chloride Stopped (11/23/18  1702)  . ceFEPime (MAXIPIME) IV Stopped (11/24/18 0118)  . fluconazole (DIFLUCAN) IV    . heparin 1,450 Units/hr (11/24/18 0514)  . 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    Assessment/Plan:   1. 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  Wound culture obtained by Dr Maren Beach in the VAD clinic. Blood CX x2 obtained.  CT with small amount of inflammation and fluid along the driveline.  Driveline exit site ok but to right of driveline he has opening with copious purulence. I personally changed the dressing. Open wound is 3 cm with induration noted. Also appears to have fungal rash likely due to moisture.  WBC ok.  Blood CX pending.  Continue  vancomycin and cefepime.  ID appreciated.   Off coumadin for possible debridement.  2. 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  -VAD parameters stable. LVAD parameters stable except for frequent PI events (no low flows).  - He  does not need Lasix.  -Continue home regimen.  - Continue spironolactone25mg  daily.  -ContinueEntresto to 97/103 bid - Continue amlodipine 5 mg daily. - Continue ASA 81 daily.  - Continue warfarin for INR 2-2.5. Hold coumadin  -INR 1.6. Continue heparin drip. No coumadin for now.    2. Smoking: Still smoking a few cigs/day. Using nicotine patches. Knows he needs to quit to be a transplant candidate.  3. RLE DVT: Start heparin 4. OSA: Continue CPAP.No change. 5. Hyperlipidemia: Atorvastatin.  6. Type II diabetes: Metformin. Continue sliding scale.  7. HTN Stable. Continue home regimen.  8.Rash -?Fungal infection under  driveline dressing - Started diflucan 11/23/2018  Possible surgical debridement.    I reviewed the LVAD parameters from today, and compared the results to the patient's prior recorded data.  No programming changes were made.  The LVAD is functioning within specified parameters.  The patient performs LVAD self-test daily.  LVAD interrogation was negative for any significant power changes, alarms or PI events/speed drops.  LVAD equipment check completed and is in good working order.  Back-up equipment present.   LVAD education done on emergency procedures and precautions and reviewed exit site care.  Length of Stay: 1  Amy Clegg, NP 11/24/2018, 7:59 AM  VAD Team --- VAD ISSUES ONLY--- Pager [phone] (7am - 7am)  Advanced Heart Failure Team  Pager [phone] (M-F; 7a - 4p)  Please contact [facility] for night-coverage after hours (4p -7a ) and weekends on [email or web address]  Patient seen with NP, agree with the above note.    Driveline site appears benign but there is a draining wound to the right of it. CT abdomen showed no abscess.    Afebrile, no WBCs.  ID following.  Now on vancomycin/cefepime.   LVAD parameters and MAP stable.  He continues on his home cardiac meds.   INR held for possible wound debridement, he is now on heparin gtt.   Will need to discuss with Dr. Donata Clay whether or not patient will need OR debridement.   Marca Ancona 11/24/2018 8:28 AM

## 2018-11-24 NOTE — Progress Notes (Addendum)
Patient ID: Theodore Welch, male   DOB: 11-05-1961, 58 y.o.   MRN: [id number]         [facility] for Infectious Disease  Date of Admission:  11/23/2018           Day 2 vancomycin        Day 2 cefepime        Day 2 fluconazole ASSESSMENT: His wound culture is growing staph aureus.  Continue vancomycin and stop cefepime now.  He was started on fluconazole for possible fungal rash on his left lateral abdomen.  Fluconazole has excellent bioavailability.  I will change him to oral dosing now.  PLAN: 1. Continue vancomycin pending final culture results 2. Discontinue cefepime 3. Change fluconazole to oral form  Principal Problem:   Complication involving left ventricular assist device (LVAD) Active Problems:   HTN (hypertension)   Tobacco use   Congestive heart failure (HCC)   DVT (deep venous thrombosis) (HCC)   OSA (obstructive sleep apnea)   Scheduled Meds: . amLODipine  5 mg Oral Daily  . aspirin EC  81 mg Oral Daily  . atorvastatin  20 mg Oral Daily  . carbamide peroxide  5 drop Both EARS TID  . DULoxetine  60 mg Oral Daily  . fluconazole  200 mg Oral Daily  . gabapentin  600 mg Oral TID  . insulin aspart  0-9 Units Subcutaneous TID WC  . metFORMIN  500 mg Oral BID WC  . nystatin   Topical BID  . pantoprazole  40 mg Oral Daily  . sacubitril-valsartan  1 tablet Oral BID  . spironolactone  25 mg Oral Daily  . thiamine  100 mg Oral Daily  . vitamin B-12  1,000 mcg Oral Daily   Continuous Infusions: . sodium chloride Stopped (11/23/18 1702)  . ceFEPime (MAXIPIME) IV    . heparin 1,600 Units/hr (11/24/18 1048)  . vancomycin 1,000 mg (11/24/18 0506)   PRN Meds:.sodium chloride, acetaminophen, diphenhydrAMINE, fluticasone, hydrOXYzine, ipratropium-albuterol, methocarbamol, ondansetron (ZOFRAN) IV, oxyCODONE, traMADol   SUBJECTIVE: He is feeling better.  He tells me that he is not having nearly as much drainage from his abdominal wound.  He is not having any pain.   He still has some dry skin and itching lateral to the driveline exit site.  Review of Systems: Review of Systems  Constitutional: Negative for chills, diaphoresis and fever.  Skin: Positive for itching and rash.    No Known Allergies  OBJECTIVE: Vitals:   11/24/18 0006 11/24/18 0500 11/24/18 0514 11/24/18 0723  BP: 112/89  (!) 111/93 99/82  Pulse: 93  92 92  Resp: 16  (!) 22 (!) 22  Temp: 98.3 F (36.8 C)  98.2 F (36.8 C) 98.3 F (36.8 C)  TempSrc: Oral  Oral Oral  SpO2: 98%  100% 100%  Weight:  71.3 kg     Body mass index is 22.55 kg/m.  Physical Exam Constitutional:      Comments: He is sitting up in bed reading the newspaper.  He is in good spirits.  Cardiovascular:     Comments: LVAD hum present. Pulmonary:     Effort: Pulmonary effort is normal.     Breath sounds: Normal breath sounds.  Abdominal:     Palpations: Abdomen is soft.     Tenderness: There is no abdominal tenderness.     Comments: Clean dry gauze dressing over abdominal wound and driveline exit site.  He has some dry flaking skin on his left abdomen.  Lab Results Lab Results  Component Value Date   WBC 10.0 11/24/2018   HGB 10.7 (L) 11/24/2018   HCT 34.4 (L) 11/24/2018   MCV 80.6 11/24/2018   PLT 333 11/24/2018    Lab Results  Component Value Date   CREATININE 0.87 11/24/2018   BUN 8 11/24/2018   NA 139 11/24/2018   K 3.5 11/24/2018   CL 109 11/24/2018   CO2 20 (L) 11/24/2018    Lab Results  Component Value Date   ALT 12 10/28/2018   AST 22 10/28/2018   ALKPHOS 149 (H) 10/28/2018   BILITOT 0.6 10/28/2018     Microbiology: Recent Results (from the past 240 hour(s))  Culture, blood (routine x 2)     Status: None (Preliminary result)   Collection Time: 11/23/18  9:13 AM  Result Value Ref Range Status   Specimen Description BLOOD RIGHT FOREARM  Final   Special Requests   Final    BOTTLES DRAWN AEROBIC AND ANAEROBIC Blood Culture adequate volume   Culture   Final    NO  GROWTH < 24 HOURS Performed at [facility], [address], [place], [place] [address]    Report Status PENDING  Incomplete  Culture, blood (Routine X 2) w Reflex to ID Panel     Status: None (Preliminary result)   Collection Time: 11/23/18  9:17 AM  Result Value Ref Range Status   Specimen Description BLOOD LEFT FOREARM  Final   Special Requests   Final    BOTTLES DRAWN AEROBIC AND ANAEROBIC Blood Culture adequate volume   Culture   Final    NO GROWTH < 24 HOURS Performed at [facility], [address], [place], [place] [address]    Report Status PENDING  Incomplete  Aerobic Culture (superficial specimen)     Status: None (Preliminary result)   Collection Time: 11/23/18 10:27 AM  Result Value Ref Range Status   Specimen Description WOUND LVAD  Final   Special Requests NONE  Final   Gram Stain   Final    RARE WBC PRESENT, PREDOMINANTLY PMN RARE GRAM POSITIVE COCCI    Culture   Final    FEW STAPHYLOCOCCUS AUREUS SUSCEPTIBILITIES TO FOLLOW Performed at [facility], [address] [place], [place] [address]    Report Status PENDING  Incomplete    Cliffton Asters, MD [facility] for Infectious Disease [facility] [phone] pager   [phone] cell 11/24/2018, 12:19 PM

## 2018-11-24 NOTE — Progress Notes (Signed)
ANTICOAGULATION CONSULT NOTE   Pharmacy Consult for Heparin (holding warfarin) Indication: LVAD  No Known Allergies  Patient Measurements:   Heparin Dosing Weight: 73kg  Vital Signs: Temp: 98.3 F (36.8 C) (01/07 0006) Temp Source: Oral (01/07 0006) BP: 112/89 (01/07 0006) Pulse Rate: 93 (01/07 0006)  Labs: Recent Labs    11/23/18 0900 11/23/18 2204  HGB 11.6*  --   HCT 39.9  --   PLT 312  --   LABPROT 18.5*  --   INR 1.56  --   HEPARINUNFRC  --  0.11*  CREATININE 0.95  --     Estimated Creatinine Clearance: 86.4 mL/min (by C-G formula based on SCr of 0.95 mg/dL).   Medical History: Past Medical History:  Diagnosis Date  . Cardiomyopathy, unspecified (HCC)   . CHF (congestive heart failure) (HCC)   . Chronic back pain   . Enlarged heart   . Gastric ulcer   . Gastroenteritis   . H/O degenerative disc disease     Assessment: 70 yoM with LVAD admitted with driveline infection. INR subtherapeutic on admit, will start heparin and hold warfarin in case procedures are needed.  1/7 AM update: heparin level low, no issues per RN, INR 1.56  Goal of Therapy:  Heparin level 0.3-0.7 units/ml Monitor platelets by anticoagulation protocol: Yes   Plan:  -Inc heparin to 1450 units/hr -0930 HL -Daily INR, LDH, CBC, heparin level  Abran Duke, PharmD, BCPS Clinical Pharmacist Phone: [phone]

## 2018-11-24 NOTE — Progress Notes (Signed)
PHARMACY NOTE:  ANTIMICROBIAL RENAL DOSAGE ADJUSTMENT  Current antimicrobial regimen includes a mismatch between antimicrobial dosage and estimated renal function.  As per policy approved by the Pharmacy & Therapeutics and Medical Executive Committees, the antimicrobial dosage will be adjusted accordingly.  Current antimicrobial dosage:  Cefepime 2 gm Q 12 hours  Indication: Post-op wound infection- s/p LVAD  Renal Function:  Estimated Creatinine Clearance: 92.2 mL/min (by C-G formula based on SCr of 0.87 mg/dL). []      On intermittent HD, scheduled: []      On CRRT    Antimicrobial dosage has been changed to: Cefepime 2 gm Q 8 hours  Additional comments: Increased cefepime dose to cover for pseudomonas empirically  Thank you for allowing pharmacy to be a part of this patient's care.  Sharin Mons, PharmD, BCPS, BCIDP Infectious Diseases Clinical Pharmacist Phone: [phone] 11/24/2018 10:33 AM

## 2018-11-24 NOTE — Progress Notes (Signed)
ANTICOAGULATION CONSULT NOTE - Follow-Up Consult  Pharmacy Consult for heparin Indication: LVAD  No Known Allergies  Patient Measurements: Weight: 157 lb 3 oz (71.3 kg)(Scale B) Heparin Dosing Weight: 73kg  Vital Signs: Temp: 98.3 F (36.8 C) (01/07 0723) Temp Source: Oral (01/07 0723) BP: 99/82 (01/07 0723) Pulse Rate: 92 (01/07 0723)  Labs: Recent Labs    11/23/18 0900 11/23/18 2204 11/24/18 0223 11/24/18 0918  HGB 11.6*  --  10.7*  --   HCT 39.9  --  34.4*  --   PLT 312  --  333  --   LABPROT 18.5*  --  18.9*  --   INR 1.56  --  1.61  --   HEPARINUNFRC  --  0.11*  --  0.20*  CREATININE 0.95  --  0.87  --     Estimated Creatinine Clearance: 92.2 mL/min (by C-G formula based on SCr of 0.87 mg/dL).   Medical History: Past Medical History:  Diagnosis Date  . Cardiomyopathy, unspecified (HCC)   . CHF (congestive heart failure) (HCC)   . Chronic back pain   . Enlarged heart   . Gastric ulcer   . Gastroenteritis   . H/O degenerative disc disease     Assessment: 49 yoM with LVAD admitted with driveline infection. INR was subtherapeutic on admit so heparin started and warfarin held for now in case procedures are needed involving wound. INR remains subtherapeutic as expected, H/H and LDH are stable. Heparin level subtherapeutic at 0.20 after rate increase this morning, will increase further and recheck in the morning.  Goal of Therapy:  Heparin level 0.3-0.7 units/ml Monitor platelets by anticoagulation protocol: Yes   Plan:  -Increase heparin to 1600 units/hr -Daily INR, LDH, CBC, heparin level  Fredonia Highland, PharmD, BCPS Clinical Pharmacist [phone] Please check AMION for all [facility] Pharmacy numbers 11/24/2018

## 2018-11-25 ENCOUNTER — Inpatient Hospital Stay: Payer: Self-pay

## 2018-11-25 LAB — CBC
HCT: 34.8 % — ABNORMAL LOW (ref 39.0–52.0)
Hemoglobin: 10.6 g/dL — ABNORMAL LOW (ref 13.0–17.0)
MCH: 24.4 pg — ABNORMAL LOW (ref 26.0–34.0)
MCHC: 30.5 g/dL (ref 30.0–36.0)
MCV: 80 fL (ref 80.0–100.0)
Platelets: 324 10*3/uL (ref 150–400)
RBC: 4.35 MIL/uL (ref 4.22–5.81)
RDW: 16.5 % — AB (ref 11.5–15.5)
WBC: 8.1 10*3/uL (ref 4.0–10.5)
nRBC: 0 % (ref 0.0–0.2)

## 2018-11-25 LAB — VANCOMYCIN, PEAK: Vancomycin Pk: 20 ug/mL — ABNORMAL LOW (ref 30–40)

## 2018-11-25 LAB — GLUCOSE, CAPILLARY
Glucose-Capillary: 121 mg/dL — ABNORMAL HIGH (ref 70–99)
Glucose-Capillary: 92 mg/dL (ref 70–99)
Glucose-Capillary: 76 mg/dL (ref 70–99)
Glucose-Capillary: 106 mg/dL — ABNORMAL HIGH (ref 70–99)

## 2018-11-25 LAB — BASIC METABOLIC PANEL
Anion gap: 7 (ref 5–15)
BUN: 5 mg/dL — ABNORMAL LOW (ref 6–20)
CO2: 23 mmol/L (ref 22–32)
CREATININE: 0.87 mg/dL (ref 0.61–1.24)
Calcium: 9.1 mg/dL (ref 8.9–10.3)
Chloride: 111 mmol/L (ref 98–111)
GFR calc Af Amer: >60 mL/min (ref >60)
GFR calc non Af Amer: >60 mL/min (ref >60)
Glucose, Bld: 103 mg/dL — ABNORMAL HIGH (ref 70–99)
Potassium: 4.1 mmol/L (ref 3.5–5.1)
SODIUM: 141 mmol/L (ref 135–145)

## 2018-11-25 LAB — HEPARIN LEVEL (UNFRACTIONATED): HEPARIN UNFRACTIONATED: 0.46 [IU]/mL (ref 0.30–0.70)

## 2018-11-25 LAB — AEROBIC CULTURE W GRAM STAIN (SUPERFICIAL SPECIMEN)

## 2018-11-25 LAB — LACTATE DEHYDROGENASE: LDH: 128 U/L (ref 98–192)

## 2018-11-25 LAB — PROTIME-INR
INR: 1.7
Prothrombin Time: 19.8 seconds — ABNORMAL HIGH (ref 11.4–15.2)

## 2018-11-25 MED ORDER — WARFARIN SODIUM 5 MG PO TABS
5.0000 mg | ORAL_TABLET | Freq: Once | ORAL | Status: AC
  Administered 2018-11-25: 5 mg via ORAL
  Filled 2018-11-25: qty 1

## 2018-11-25 MED ORDER — CEFAZOLIN SODIUM-DEXTROSE 2-4 GM/100ML-% IV SOLN
2.0000 g | Freq: Three times a day (TID) | INTRAVENOUS | Status: DC
  Administered 2018-11-25 – 2018-12-03 (×25): 2 g via INTRAVENOUS
  Filled 2018-11-25 (×29): qty 100

## 2018-11-25 MED ORDER — WARFARIN - PHARMACIST DOSING INPATIENT
Freq: Every day | Status: DC
  Administered 2018-11-25 – 2018-12-08 (×6)

## 2018-11-25 MED ORDER — CEFAZOLIN SODIUM-DEXTROSE 1-4 GM/50ML-% IV SOLN
1.0000 g | Freq: Three times a day (TID) | INTRAVENOUS | Status: DC
  Filled 2018-11-25: qty 50

## 2018-11-25 MED ORDER — PNEUMOCOCCAL VAC POLYVALENT 25 MCG/0.5ML IJ INJ
0.5000 mL | INJECTION | INTRAMUSCULAR | Status: AC
  Administered 2018-11-27: 0.5 mL via INTRAMUSCULAR
  Filled 2018-11-25: qty 0.5

## 2018-11-25 NOTE — Progress Notes (Addendum)
Advanced Heart Failure VAD Team Note  PCP-Cardiologist: No primary care provider on file.   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 Initially on  vancomycin Theodore cefepime. Yesterday narrowed to vancomycin.   Remains on heparin drip.   Denies SOB . Abdomen feeling not as tender.    Wound CX- Staph aureus  Blood CX- NGTD.    LVAD INTERROGATION:  HeartMate III LVAD:   Flow 4.4  liters/min, speed 5700, power 4 PI 4.3   Objective:    Vital Signs:   Temp:  [97.2 F (36.2 C)-98.1 F (36.7 C)] 97.9 F (36.6 C) (01/08 0401) Pulse Rate:  [82-85] 82 (01/07 1941) Resp:  [19-24] 19 (01/08 0401) BP: (84-98)/(71-78) 94/78 (01/07 2355) SpO2:  [99 %] 99 % (01/07 1941) Weight:  [72.6 kg] 72.6 kg (01/08 0401)   Mean arterial Pressure 80s   Intake/Output:  No intake or output data in the 24 hours ending 11/25/18 0725   Physical Exam   Physical Exam: GENERAL: NAD HEENT: normal  NECK: Supple, JVP  .  2+ bilaterally, no bruits.  No lymphadenopathy or thyromegaly appreciated.   CARDIAC:  Mechanical heart sounds with LVAD hum present.  LUNGS:  Clear to auscultation bilaterally.  ABDOMEN:  Soft, round, nontender, positive bowel sounds x4.     LVAD exit site: well-healed Theodore incorporated.  Dressing dry Theodore intact.  No erythema or drainage.  Stabilization device present Theodore accurately applied.  Driveline dressing is being changed daily per sterile technique. EXTREMITIES:  Warm Theodore dry, no cyanosis, clubbing, rash or edema  NEUROLOGIC:  Alert Theodore oriented x 4.  No aphasia.  No dysarthria.  Affect pleasant.  SKin:  Wound to right of the driveline copoius exudate.   Telemetry  NSR 90s    EKG    N/A  Labs   Basic Metabolic Panel: Recent Labs  Lab 11/23/18 0900 11/24/18 0223  NA 138 139  K 4.0 3.5  CL 105 109  CO2 18* 20*  GLUCOSE 250* 110*  BUN 11 8  CREATININE 0.95 0.87  CALCIUM 9.2 9.1    Liver Function Tests: No  results for input(s): AST, ALT, ALKPHOS, BILITOT, PROT, ALBUMIN in the last 168 hours. No results for input(s): LIPASE, AMYLASE in the last 168 hours. No results for input(s): AMMONIA in the last 168 hours.  CBC: Recent Labs  Lab 11/23/18 0900 11/24/18 0223  WBC 10.4 10.0  HGB 11.6* 10.7*  HCT 39.9 34.4*  MCV 84.2 80.6  PLT 312 333    INR: Recent Labs  Lab 11/23/18 0900 11/24/18 0223  INR 1.56 1.61    Other results:  EKG:    Imaging   Ct Abdomen Pelvis W Contrast  Result Date: 11/23/2018 CLINICAL DATA:  Left ventricular assist device placement 2 days ago with draining abdominal incision Theodore low-grade fever. EXAM: CT ABDOMEN Theodore PELVIS WITH CONTRAST TECHNIQUE: Multidetector CT imaging of the abdomen Theodore pelvis was performed using the standard protocol following bolus administration of intravenous contrast. CONTRAST:  80mL OMNIPAQUE IOHEXOL 300 MG/ML  SOLN COMPARISON:  CT 07/24/2018. FINDINGS: Lower chest: Interval placement of a left ventricular assist device. No intrathoracic complications are identified. There is stable cardiomegaly Theodore no significant pericardial effusion. There is a small residual left pleural effusion Theodore mild left lower lobe atelectasis. There are inflammatory changes Theodore ill-defined air-fluid collection in the anterior abdominal wall inferior to the LVAD drive line in the left upper quadrant. This  measures 2.9 x 3.0 cm maximally on image 42/5 Theodore extend to the skin incision. There is no drainable fluid collection. Hepatobiliary: Interval resolved changes of chronic passive congestion in the liver. No focal hepatic abnormality. The gallbladder is contracted. No evidence of gallstones, gallbladder wall thickening or biliary dilatation. Pancreas: No acute findings. 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 No pancreatic ductal dilatation or surrounding inflammation. Spleen: Normal in size without focal abnormality. Adrenals/Urinary Tract: Both adrenal glands appear normal. Stable  small renal cysts. No evidence of urinary tract calculus or hydronephrosis. The bladder appears normal for its degree of distention. Stomach/Bowel: No evidence of bowel wall thickening, distention or surrounding inflammatory change. The appendix appears normal. Vascular/Lymphatic: There are no enlarged abdominal or pelvic lymph nodes. Mild aortic Theodore branch vessel atherosclerosis. No acute vascular findings. Reproductive: The prostate gland Theodore seminal vesicles appear normal. Other: No ascites or intra-abdominal fluid collection. As above, small ill-defined fluid collection along the drive line in the subcutaneous fat of the left upper quadrant of the anterior abdominal wall. Musculoskeletal: No acute or significant osseous findings. Degenerative disc disease noted at L4-5. IMPRESSION: 1. Interval placement of left ventricular assist device. There are no complications identified within the chest. There is a small amount of inflammation Theodore ill-defined fluid along the drive line in the anterior abdominal wall of the left upper quadrant, but no drainable fluid collection. 2. Interval resolution of anasarca. Small residual left pleural effusion Theodore left lower lobe atelectasis. 3. Interval resolution of chronic passive congestion changes in the liver. 4. No acute abdominopelvic findings. Electronically Signed   By: Carey Bullocks M.D.   On: 11/23/2018 13:07     Medications:     Scheduled Medications: . amLODipine  5 mg Oral Daily  . aspirin EC  81 mg Oral Daily  . atorvastatin  20 mg Oral Daily  . carbamide peroxide  5 drop Both EARS TID  . DULoxetine  60 mg Oral Daily  . fluconazole  100 mg Oral Daily  . gabapentin  600 mg Oral TID  . insulin aspart  0-9 Units Subcutaneous TID WC  . metFORMIN  500 mg Oral BID WC  . nystatin   Topical BID  . pantoprazole  40 mg Oral Daily  . sacubitril-valsartan  1 tablet Oral BID  . spironolactone  25 mg Oral Daily  . thiamine  100 mg Oral Daily  . vitamin B-12   1,000 mcg Oral Daily  . Warfarin - Pharmacist Dosing Inpatient   Does not apply q1800    Infusions: . sodium chloride Stopped (11/23/18 1702)  . heparin 1,600 Units/hr (11/25/18 0312)  . vancomycin 1,000 mg (11/25/18 0412)    PRN Medications: sodium chloride, acetaminophen, diphenhydrAMINE, fluticasone, hydrOXYzine, ipratropium-albuterol, methocarbamol, ondansetron (ZOFRAN) IV, oxyCODONE, traMADol   Patient Profile  Theodore Welch, Theodore Welch, Theodore Welch, Theodore Welch, Theodore Welch, Theodore Welch.   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    Assessment/Plan:   1. 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  Wound culture obtained by Dr Maren Beach in the VAD clinic. Blood CX x2 obtained.  CT with small amount of inflammation Theodore fluid along the driveline.  Driveline exit site ok but to right of driveline he has opening with copious purulence.Open wound is 3 cm with induration noted. Also appears to have fungal rash likely due to moisture. Continue conservative wound care with Aquacel AG Labs pending.  Blood CX -NGTD. Wound CX- Few Staphylococcus Aureus   Continue  Vancomycin. Off cefepime.   ID appreciated.  No plan for debridement per Dr Maren Beach. Restart coumadin.   2. Chronic systolic CHF: Nonischemic Welch, now s/p Heartmate 3 LVAD in 9/19.  -VAD parameters stable. LVAD parameters stable except for frequent PI events (no low flows).  - He does not need Lasix.  From HF perspective he is stable.  - Continue spironolactone25mg  daily.  -ContinueEntresto to 97/103 bid - Continue amlodipine 5 mg daily. - Continue ASA 81 daily.  - Continue warfarin for INR 2-2.5. Hold coumadin  -INR pending.  Continue heparin drip. Resume coumadin.    2. Theodore Welch: Still Theodore Welch a few cigs/day. Using nicotine patches. Knows he needs to quit to be a transplant candidate.  3. Theodore Welch: On heparin Theodore restarting coumadin.  4. Theodore Welch: Continue CPAP.No change. 5.  Hyperlipidemia: Atorvastatin.  6. Type II diabetes: Metformin. Continue sliding scale.  7. HTN Stable.  8.Rash -?Fungal infection under driveline dressing - Started diflucan 11/23/2018  No plan for surgical debridement.    I reviewed the LVAD parameters from today, Theodore compared the results to the patient's prior recorded data.  No programming changes were made.  The LVAD is functioning within specified parameters.  The patient performs LVAD self-test daily.  LVAD interrogation was negative for any significant power changes, alarms or PI events/speed drops.  LVAD equipment check completed Theodore is in good working order.  Back-up equipment present.   LVAD education done on emergency procedures Theodore precautions Theodore reviewed exit site care.  Length of Stay: 2  Tonye Becket, NP 11/25/2018, 7:25 AM  VAD Team --- VAD ISSUES ONLY--- Pager [phone] (7am - 7am)  Advanced Heart Failure Team  Pager [phone] (M-F; 7a - 4p)  Please contact [facility] for night-coverage after hours (4p -7a ) Theodore weekends on [email or web address]  Patient seen with NP, agree with the above note.    Some drainage from driveline exit site, Theodore there is also a draining wound to the right of it. CT abdomen showed no abscess.    Afebrile, normal WBCs.  ID following.  Wound culture with S aureus, currently on IV vancomycin.  Will make decision on final antibiotic regimen once sensitivities on S aureus have returned. Will likely need a PICC.   LVAD parameters Theodore MAP stable.  He continues on his home cardiac meds.   Warfarin restart as it does not appear that he will need surgical debridement.  Can stop heparin gtt when INR > 1.8 (pending INR this morning).   Marca Ancona 11/25/2018 8:35 AM

## 2018-11-25 NOTE — Progress Notes (Signed)
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 [name].   HM III LVAD implanted on 08/03/18 by Dr. Cyndia Bent under Destination Therapy criteria due to smoking status.  Pt awake, denies complaints. Says he has very little "itching" around and under Driveline dressing. Dressing intact with some drainage noted; Theodore Grinder, NP has already assessed.    Vital signs: Temp: 97.9 HR: 87 Doppler Pressure: 90 Automatic BP: 113/90 (98) O2 Sat: 99% on RA Wt:  157>160 lbs   LVAD interrogation reveals:  Speed: 5700 Flow: 4.2 Power:  4.4w PI: 4.5 Alarms: none Events:  30 PI events on 11/24/17 Hematocrit: 39  Fixed speed: 5700 Low speed limit:  5400   Drive Line site: Existing VAD dressing removed and site care performed using sterile technique. Drive line exit site cleaned with betadine swab, allowed to dry, and gauze dressing with silver strip re-applied. Exit site incorporated, but small opening around exit site, the velour is fully implanted at exit site. Moderate amount yellow drainage with no redness, tenderness, or foul odor noted; rash much improved. Skin prep and tape supplied in kit used. .Drive line anchor re-applied. Daily dressing changes using daily kit with silver strip required.          Periumbilical counterincision site: Existing dressing removed and site care performed using sterile technique. Site cleaned with betadine swab, allowed to dry and re-packed with aquacel silver strip, then covered with large aquacel and gauze dressing. Expressed small amount yellow drainage from site, no redness, tenderness, or foul odor noted. Twice daily dressing changes required.              Labs:  LDH trend: 165>127>128  INR trend: 1.56>1.61>1.70  Anticoagulation Plan: -INR Goal:  2.0 - 2.5  -ASA Dose:  81 mg daily - Coumadin re-started 11/24/18  Device: N/A - no ICD  Infection:  - BC's x 2 11/23/18>>NTD - LVAD Wound culture  11/24/18>>staphylococcus aureus   Plan/Recommendations:  1. Contact VAD Coordinator for any VAD equipment or driveline issues. 2. Daily drive line dressing changes; twice daily periumbilical counterincision site dressing changes.VAD Coordinator will change today.   Zada Girt RN, VAD Coordinator 24/7 VAD Pager: [phone]

## 2018-11-25 NOTE — Telephone Encounter (Signed)
Pt hospitalized from wound infection. Pt will DC home with IV antibiotics. Pt CRPII on hold until home health care resolved.  Pt aware of this plan and verbalized understanding. Deveron Furlong, RN, BSN Cardiac Pulmonary Rehab

## 2018-11-25 NOTE — Progress Notes (Signed)
Periumbilical counterincision site: Existing dressing removed and site care performed using sterile technique. Site cleaned with betadine swab, allowed to dry and re-packed with aquacel silver strip, then covered with large aquacel and gauze dressing. Expressed small amount yellow drainage from site, no redness, tenderness, or foul odor noted. Twice daily dressing changes required.   Caregiver at bedside and observed dressing change.   Hessie Diener RN, VAD Coordinator [phone]

## 2018-11-25 NOTE — Progress Notes (Signed)
Patient ID: Theodore Welch, male   DOB: 25-Dec-1960, 58 y.o.   MRN: [id number]         [facility] Infectious Disease  Date of Admission:  11/23/2018           Day 3 vancomycin        Day 3 fluconazole ASSESSMENT: His wound culture is growing MSSA.  Blood cultures remain negative.  I will change vancomycin to cefazolin and order PICC placement.  PLAN: 1. Change vancomycin to cefazolin 2. Continue fluconazole 3. PICC placement  Principal Problem:   Complication involving left ventricular assist device (LVAD) Active Problems:   HTN (hypertension)   Tobacco use   Congestive heart failure (HCC)   DVT (deep venous thrombosis) (HCC)   OSA (obstructive sleep apnea)   Scheduled Meds: . amLODipine  5 mg Oral Daily  . aspirin EC  81 mg Oral Daily  . atorvastatin  20 mg Oral Daily  . carbamide peroxide  5 drop Both EARS TID  . DULoxetine  60 mg Oral Daily  . fluconazole  100 mg Oral Daily  . gabapentin  600 mg Oral TID  . insulin aspart  0-9 Units Subcutaneous TID WC  . metFORMIN  500 mg Oral BID WC  . nystatin   Topical BID  . pantoprazole  40 mg Oral Daily  . [START ON 11/26/2018] pneumococcal 23 valent vaccine  0.5 mL Intramuscular Tomorrow-1000  . sacubitril-valsartan  1 tablet Oral BID  . spironolactone  25 mg Oral Daily  . thiamine  100 mg Oral Daily  . vitamin B-12  1,000 mcg Oral Daily  . warfarin  5 mg Oral ONCE-1800  . Warfarin - Pharmacist Dosing Inpatient   Does not apply q1800   Continuous Infusions: . sodium chloride Stopped (11/23/18 1702)  . heparin 1,600 Units/hr (11/25/18 0312)   PRN Meds:.sodium chloride, acetaminophen, diphenhydrAMINE, fluticasone, hydrOXYzine, ipratropium-albuterol, methocarbamol, ondansetron (ZOFRAN) IV, oxyCODONE, traMADol   SUBJECTIVE: He is feeling okay.  He is not having as much itching lateral to his driveline exit site.  He tells me that his nurse noted some drainage from the exit site this morning that is new.  Review of  Systems: Review of Systems  Constitutional: Negative for chills, diaphoresis and fever.  Gastrointestinal: Negative for abdominal pain.  Skin: Positive for itching and rash.    No Known Allergies  OBJECTIVE: Vitals:   11/25/18 0401 11/25/18 0726 11/25/18 0800 11/25/18 1134  BP:  99/80 113/90 90/84  Pulse:  85  87  Resp: 19 19  20   Temp: 97.9 F (36.6 C) 97.9 F (36.6 C)  (!) 96.5 F (35.8 C)  TempSrc: Oral Oral  Axillary  SpO2:      Weight: 72.6 kg     Height:       Body mass index is 22.97 kg/m.  Physical Exam Constitutional:      Comments: He is pleasant and in good spirits.  Cardiovascular:     Comments: LVAD hum present. Pulmonary:     Effort: Pulmonary effort is normal.     Breath sounds: Normal breath sounds.  Abdominal:     Palpations: Abdomen is soft.     Tenderness: There is no abdominal tenderness.     Comments: Clean dry gauze dressing over abdominal wound and driveline exit site.  He has some dry flaking skin on his left abdomen.  I reviewed the pictures taken this morning during dressing change by Cherlyn Roberts, RN.  He has continued purulent  drainage from the periumbilical wound and has new, purulent drainage from a small area around his driveline exit site.     Lab Results Lab Results  Component Value Date   WBC 8.1 11/25/2018   HGB 10.6 (L) 11/25/2018   HCT 34.8 (L) 11/25/2018   MCV 80.0 11/25/2018   PLT 324 11/25/2018    Lab Results  Component Value Date   CREATININE 0.87 11/25/2018   BUN 5 (L) 11/25/2018   NA 141 11/25/2018   K 4.1 11/25/2018   CL 111 11/25/2018   CO2 23 11/25/2018    Lab Results  Component Value Date   ALT 12 10/28/2018   AST 22 10/28/2018   ALKPHOS 149 (H) 10/28/2018   BILITOT 0.6 10/28/2018     Microbiology: Recent Results (from the past 240 hour(s))  Culture, blood (routine x 2)     Status: None (Preliminary result)   Collection Time: 11/23/18  9:13 AM  Result Value Ref Range Status   Specimen Description  BLOOD RIGHT FOREARM  Final   Special Requests   Final    BOTTLES DRAWN AEROBIC AND ANAEROBIC Blood Culture adequate volume   Culture   Final    NO GROWTH 2 DAYS Performed at [facility], [address] [place], [place] [address]    Report Status PENDING  Incomplete  Culture, blood (Routine X 2) w Reflex to ID Panel     Status: None (Preliminary result)   Collection Time: 11/23/18  9:17 AM  Result Value Ref Range Status   Specimen Description BLOOD LEFT FOREARM  Final   Special Requests   Final    BOTTLES DRAWN AEROBIC AND ANAEROBIC Blood Culture adequate volume   Culture   Final    NO GROWTH 2 DAYS Performed at [facility], [address] [place], [place] [address]    Report Status PENDING  Incomplete  Aerobic Culture (superficial specimen)     Status: None   Collection Time: 11/23/18 10:27 AM  Result Value Ref Range Status   Specimen Description WOUND LVAD  Final   Special Requests NONE  Final   Gram Stain   Final    RARE WBC PRESENT, PREDOMINANTLY PMN RARE GRAM POSITIVE COCCI Performed at [facility], [address], [place], [place] [address]    Culture FEW STAPHYLOCOCCUS AUREUS  Final   Report Status 11/25/2018 FINAL  Final   Organism ID, Bacteria STAPHYLOCOCCUS AUREUS  Final      Susceptibility   Staphylococcus aureus - MIC*    CIPROFLOXACIN <=0.5 SENSITIVE Sensitive     ERYTHROMYCIN >=8 RESISTANT Resistant     GENTAMICIN <=0.5 SENSITIVE Sensitive     OXACILLIN <=0.25 SENSITIVE Sensitive     TETRACYCLINE <=1 SENSITIVE Sensitive     VANCOMYCIN <=0.5 SENSITIVE Sensitive     TRIMETH/SULFA <=10 SENSITIVE Sensitive     CLINDAMYCIN RESISTANT Resistant     RIFAMPIN <=0.5 SENSITIVE Sensitive     Inducible Clindamycin POSITIVE Resistant     * FEW STAPHYLOCOCCUS AUREUS    Cliffton Asters, MD [facility] for Infectious Disease [facility] [phone] pager   [phone] cell 11/25/2018, 11:51 AM

## 2018-11-25 NOTE — Progress Notes (Signed)
Procedure(s) (LRB): ABDOMINAL WOUND DEBRIDEMENT (N/A) APPLICATION OF WOUND VAC (N/A) Subjective: CT scan shows indurated mid abdominal soft tissue without abscess.  Open mid abdominal wound being packed to level of anterior rectus sheath Patient will not need surgical debridement and may continue Coumadin Patient will need extended course of IV antibiotics-Ancef for MSSA and agree with recommended PICC line placement Continue daily dressing changes Objective: Vital signs in last 24 hours: Temp:  [96.5 F (35.8 C)-98.1 F (36.7 C)] 96.5 F (35.8 C) (01/08 1134) Pulse Rate:  [82-87] 87 (01/08 1134) Cardiac Rhythm: Normal sinus rhythm (01/08 0700) Resp:  [19-24] 20 (01/08 1134) BP: (84-113)/(71-90) 90/84 (01/08 1134) SpO2:  [99 %] 99 % (01/07 1941) Weight:  [72.6 kg] 72.6 kg (01/08 0401)  Hemodynamic parameters for last 24 hours:    Intake/Output from previous day: No intake/output data recorded. Intake/Output this shift: No intake/output data recorded.  Alert and comfortable No abdominal tenderness Normal VAD hum  Lab Results: Recent Labs    11/24/18 0223 11/25/18 0818  WBC 10.0 8.1  HGB 10.7* 10.6*  HCT 34.4* 34.8*  PLT 333 324   BMET:  Recent Labs    11/24/18 0223 11/25/18 0818  NA 139 141  K 3.5 4.1  CL 109 111  CO2 20* 23  GLUCOSE 110* 103*  BUN 8 5*  CREATININE 0.87 0.87  CALCIUM 9.1 9.1    PT/INR:  Recent Labs    11/25/18 0818  LABPROT 19.8*  INR 1.70   ABG    Component Value Date/Time   PHART 7.473 (H) 08/05/2018 0345   HCO3 26.9 08/05/2018 0345   TCO2 26 08/04/2018 1622   O2SAT 73.6 08/12/2018 0345   CBG (last 3)  Recent Labs    11/24/18 2107 11/25/18 0724 11/25/18 1130  GLUCAP 141* 121* 92    Assessment/Plan: S/P Procedure(s) (LRB): ABDOMINAL WOUND DEBRIDEMENT (N/A) APPLICATION OF WOUND VAC (N/A) Continue IV antibiotics and dressing changes Does not need surgical debridement at this time   LOS: 2 days    Kathlee Nations  Trigt III 11/25/2018

## 2018-11-25 NOTE — Progress Notes (Addendum)
Pharmacy Antibiotic Note  Theodore Welch is a 58 y.o. 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  Pharmacy initially consulted for vancomycin and cefepime dosing, now narrowed to cefazolin with wound culture growing MSSA. Blood cultures remain negative, pt afebrile, WBC normal.  Plan: -Cefazolin 2g IV q8h - utilizing more aggressive dosing than SSTI given hardware -Monitor LOT, blood cultures, renal function  Height: 5\' 10"  (177.8 cm) Weight: 160 lb 0.9 oz (72.6 kg) IBW/kg (Calculated) : 73  Temp (24hrs), Avg:97.6 F (36.4 C), Min:96.5 F (35.8 C), Max:98.1 F (36.7 C)  Recent Labs  Lab 11/23/18 0900 11/24/18 0223 11/25/18 0818  WBC 10.4 10.0 8.1  CREATININE 0.95 0.87 0.87  VANCOPEAK  --   --  20*    Estimated Creatinine Clearance: 93.9 mL/min (by C-G formula based on SCr of 0.87 mg/dL).    No Known Allergies  Antimicrobials this admission: Cefazolin 1/8 >> Fluconazole 1/6 >> Vancomycin 1/6 >>1/8 Cefepime 1/6 >>1/7  Dose adjustments this admission: none  Microbiology results: 1/6 BCx: NGTD 1/6 LVAD Wound Cx: MSSA  Fredonia Highland, PharmD, BCPS Clinical Pharmacist [phone] Please check AMION for all [facility] Pharmacy numbers 11/25/2018

## 2018-11-25 NOTE — Progress Notes (Signed)
ANTICOAGULATION CONSULT NOTE - Follow-Up Consult  Pharmacy Consult for heparin + warfarin Indication: LVAD  No Known Allergies  Patient Measurements: Height: 5\' 10"  (177.8 cm) Weight: 160 lb 0.9 oz (72.6 kg) IBW/kg (Calculated) : 73 Heparin Dosing Weight: 73kg  Vital Signs: Temp: 97.9 F (36.6 C) (01/08 0726) Temp Source: Oral (01/08 0726) BP: 113/90 (01/08 0800) Pulse Rate: 85 (01/08 0726)  Labs: Recent Labs    11/23/18 0900 11/23/18 2204 11/24/18 0223 11/24/18 0918 11/25/18 0818  HGB 11.6*  --  10.7*  --  10.6*  HCT 39.9  --  34.4*  --  34.8*  PLT 312  --  333  --  324  LABPROT 18.5*  --  18.9*  --  19.8*  INR 1.56  --  1.61  --  1.70  HEPARINUNFRC  --  0.11*  --  0.20* 0.46  CREATININE 0.95  --  0.87  --  0.87    Estimated Creatinine Clearance: 93.9 mL/min (by C-G formula based on SCr of 0.87 mg/dL).   Medical History: Past Medical History:  Diagnosis Date  . Cardiomyopathy, unspecified (HCC)   . CHF (congestive heart failure) (HCC)   . Chronic back pain   . Enlarged heart   . Gastric ulcer   . Gastroenteritis   . H/O degenerative disc disease     Assessment: 37 yoM with LVAD admitted with driveline infection. INR was subtherapeutic on admit so heparin started and warfarin held for now in case procedures are needed involving wound.   Warfarin resumed 1/7, INR subtherapeutic as expected, noted DDI with fluconazole. Heparin therapeutic, CBC and LDH stable.  *PTA Warfarin Dose = 7.5mg  Tues/Thurs/Sat/Sun, 5mg  Mon/Wed/Fri  Goal of Therapy:  Heparin level 0.3-0.7 units/ml  INR 2-2.5 Monitor platelets by anticoagulation protocol: Yes   Plan:  -Heparin 1600 units/hr -Warfarin 5mg  PO x1 tonight -Daily INR, CBC, heparin level, LDH  Fredonia Highland, PharmD, BCPS Clinical Pharmacist [phone] Please check AMION for all [facility] Pharmacy numbers 11/25/2018

## 2018-11-26 ENCOUNTER — Inpatient Hospital Stay: Admit: 2018-11-26 | Payer: Medicare Other | Admitting: Cardiothoracic Surgery

## 2018-11-26 ENCOUNTER — Encounter (HOSPITAL_COMMUNITY)
Admission: AD | Disposition: A | Discharge status: Home Health | Payer: Self-pay | Source: Ambulatory Visit | Attending: Cardiology

## 2018-11-26 DIAGNOSIS — I5023 Acute on chronic systolic (congestive) heart failure: Secondary | ICD-10-CM

## 2018-11-26 DIAGNOSIS — L089 Local infection of the skin and subcutaneous tissue, unspecified: Secondary | ICD-10-CM

## 2018-11-26 LAB — BASIC METABOLIC PANEL
Anion gap: 7 (ref 5–15)
BUN: 7 mg/dL (ref 6–20)
CO2: 24 mmol/L (ref 22–32)
Calcium: 9.1 mg/dL (ref 8.9–10.3)
Chloride: 109 mmol/L (ref 98–111)
Creatinine, Ser: 0.92 mg/dL (ref 0.61–1.24)
GFR calc Af Amer: >60 mL/min (ref >60)
GFR calc non Af Amer: >60 mL/min (ref >60)
Glucose, Bld: 103 mg/dL — ABNORMAL HIGH (ref 70–99)
Potassium: 3.9 mmol/L (ref 3.5–5.1)
Sodium: 140 mmol/L (ref 135–145)

## 2018-11-26 LAB — CBC
HCT: 32.6 % — ABNORMAL LOW (ref 39.0–52.0)
Hemoglobin: 10.2 g/dL — ABNORMAL LOW (ref 13.0–17.0)
MCH: 25.4 pg — ABNORMAL LOW (ref 26.0–34.0)
MCHC: 31.3 g/dL (ref 30.0–36.0)
MCV: 81.1 fL (ref 80.0–100.0)
Platelets: 349 10*3/uL (ref 150–400)
RBC: 4.02 MIL/uL — ABNORMAL LOW (ref 4.22–5.81)
RDW: 16.5 % — ABNORMAL HIGH (ref 11.5–15.5)
WBC: 8.6 10*3/uL (ref 4.0–10.5)
nRBC: 0 % (ref 0.0–0.2)

## 2018-11-26 LAB — LACTATE DEHYDROGENASE: LDH: 120 U/L (ref 98–192)

## 2018-11-26 LAB — GLUCOSE, CAPILLARY
Glucose-Capillary: 97 mg/dL (ref 70–99)
Glucose-Capillary: 116 mg/dL — ABNORMAL HIGH (ref 70–99)
Glucose-Capillary: 106 mg/dL — ABNORMAL HIGH (ref 70–99)
Glucose-Capillary: 99 mg/dL (ref 70–99)

## 2018-11-26 LAB — PROTIME-INR
INR: 1.83
Prothrombin Time: 20.9 seconds — ABNORMAL HIGH (ref 11.4–15.2)

## 2018-11-26 LAB — HEPARIN LEVEL (UNFRACTIONATED): Heparin Unfractionated: 0.31 IU/mL (ref 0.30–0.70)

## 2018-11-26 SURGERY — DEBRIDEMENT, WOUND, STERNUM
Anesthesia: General

## 2018-11-26 MED ORDER — WARFARIN SODIUM 7.5 MG PO TABS
7.5000 mg | ORAL_TABLET | Freq: Once | ORAL | Status: AC
  Administered 2018-11-26: 7.5 mg via ORAL
  Filled 2018-11-26: qty 1

## 2018-11-26 MED ORDER — HEPARIN (PORCINE) 25000 UT/250ML-% IV SOLN
1600.0000 [IU]/h | INTRAVENOUS | Status: DC
  Administered 2018-11-27: 1600 [IU]/h via INTRAVENOUS
  Filled 2018-11-26 (×2): qty 250

## 2018-11-26 MED ORDER — SODIUM CHLORIDE 0.9% FLUSH
10.0000 mL | Freq: Two times a day (BID) | INTRAVENOUS | Status: DC
  Administered 2018-11-26 – 2018-12-03 (×13): 10 mL
  Administered 2018-12-04: 30 mL
  Administered 2018-12-04 – 2018-12-09 (×7): 10 mL

## 2018-11-26 MED ORDER — HYDROMORPHONE HCL 1 MG/ML IJ SOLN
INTRAMUSCULAR | Status: AC
  Filled 2018-11-26: qty 1

## 2018-11-26 MED ORDER — SODIUM CHLORIDE 0.9% FLUSH
10.0000 mL | INTRAVENOUS | Status: DC | PRN
  Administered 2018-12-10: 10 mL
  Filled 2018-11-26: qty 40

## 2018-11-26 MED ORDER — HYDROMORPHONE HCL 1 MG/ML IJ SOLN
1.0000 mg | Freq: Once | INTRAMUSCULAR | Status: AC
  Administered 2018-11-26: 1 mg via INTRAVENOUS

## 2018-11-26 NOTE — Progress Notes (Addendum)
Advanced Heart Failure VAD Team Note  PCP-Cardiologist: No primary care provider on file.   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 Initially on  vancomycin and cefepime. Remains on vancomycin.   Denies SOB. Abdomen not as tender.   Wound CX- Staph aureus -->MSSA Blood CX- NGTD.    LVAD INTERROGATION:  HeartMate III LVAD:   Flow 4.4  liters/min, speed 5700 , power 4 PI 3.2 Objective:    Vital Signs:   Temp:  [96.5 F (35.8 C)-97.9 F (36.6 C)] 97.9 F (36.6 C) (01/09 0409) Pulse Rate:  [64-87] 79 (01/08 2000) Resp:  [15-27] 27 (01/09 0409) BP: (90-114)/(49-90) 101/55 (01/09 0409) SpO2:  [98 %-100 %] 100 % (01/08 2308) Weight:  [72.4 kg] 72.4 kg (01/09 0409)   Mean arterial Pressure 80 s  Intake/Output:   Intake/Output Summary (Last 24 hours) at 11/26/2018 0727 Last data filed at 11/26/2018 0647 Gross per 24 hour  Intake 681.33 ml  Output -  Net 681.33 ml     Physical Exam   Physical Exam: GENERAL:NAD HEENT: normal  NECK: Supple, JVP flat.  2+ bilaterally, no bruits.  No lymphadenopathy or thyromegaly appreciated.   CARDIAC:  Mechanical heart sounds with LVAD hum present.  LUNGS:  Clear to auscultation bilaterally.  ABDOMEN:  Soft, round, tender, positive bowel sounds x4.     LVAD exit site: Dressing dry and intact EXTREMITIES:  Warm and dry, no cyanosis, clubbing, rash or edema  NEUROLOGIC:  Alert and oriented x 4.    No aphasia.  No dysarthria.  Affect pleasant.  SKIN: Abdominal wound right of driveline. Full thickness 1x1.5x2 cm. Indurated.       Telemetry  NSR 90s    EKG    N/A  Labs   Basic Metabolic Panel: Recent Labs  Lab 11/23/18 0900 11/24/18 0223 11/25/18 0818 11/26/18 0220  NA 138 139 141 140  K 4.0 3.5 4.1 3.9  CL 105 109 111 109  CO2 18* 20* 23 24  GLUCOSE 250* 110* 103* 103*  BUN 11 8 5* 7  CREATININE 0.95 0.87 0.87 0.92  CALCIUM 9.2 9.1 9.1 9.1    Liver Function  Tests: No results for input(s): AST, ALT, ALKPHOS, BILITOT, PROT, ALBUMIN in the last 168 hours. No results for input(s): LIPASE, AMYLASE in the last 168 hours. No results for input(s): AMMONIA in the last 168 hours.  CBC: Recent Labs  Lab 11/23/18 0900 11/24/18 0223 11/25/18 0818 11/26/18 0220  WBC 10.4 10.0 8.1 8.6  HGB 11.6* 10.7* 10.6* 10.2*  HCT 39.9 34.4* 34.8* 32.6*  MCV 84.2 80.6 80.0 81.1  PLT 312 333 324 349    INR: Recent Labs  Lab 11/23/18 0900 11/24/18 0223 11/25/18 0818 11/26/18 0220  INR 1.56 1.61 1.70 1.83    Other results:  EKG:    Imaging   [name] Ekg Site Rite  Result Date: 11/25/2018 If Site Rite image not attached, placement could not be confirmed due to current cardiac rhythm.    Medications:     Scheduled Medications: . amLODipine  5 mg Oral Daily  . aspirin EC  81 mg Oral Daily  . atorvastatin  20 mg Oral Daily  . carbamide peroxide  5 drop Both EARS TID  . DULoxetine  60 mg Oral Daily  . fluconazole  100 mg Oral Daily  . gabapentin  600 mg Oral TID  . insulin aspart  0-9 Units Subcutaneous TID WC  .  metFORMIN  500 mg Oral BID WC  . nystatin   Topical BID  . pantoprazole  40 mg Oral Daily  . pneumococcal 23 valent vaccine  0.5 mL Intramuscular Tomorrow-1000  . sacubitril-valsartan  1 tablet Oral BID  . spironolactone  25 mg Oral Daily  . thiamine  100 mg Oral Daily  . vitamin B-12  1,000 mcg Oral Daily  . Warfarin - Pharmacist Dosing Inpatient   Does not apply q1800    Infusions: . sodium chloride Stopped (11/23/18 1702)  .  ceFAZolin (ANCEF) IV 2 g (11/26/18 0617)  . 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    Assessment/Plan:   1. 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  Wound culture obtained by Dr Maren Beach in the VAD clinic. Blood CX x2 obtained.  CT with small amount of inflammation and fluid along the driveline.  Driveline exit site ok but to right of driveline he has opening with copious purulence.  Open wound is 3 cm with induration noted. Also appears to have fungal rash likely due to moisture. Continue conservative wound care with Aquacel AG Now with less exudate. Continue twice daily dressing to abdominal wound.  - Blood CX -NGTD. - Wound CX- Few Staphylococcus Aureus  --> MSSA.  Transition to cefazolin per ID, will need prolonged course to heal.  PICC line placed today.  - No plan for debridement per Dr Maren Beach.  2. 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  - VAD parameters stable. LVAD parameters stable except for frequent PI events (no low flows).  - He does not need Lasix. Remains stable from HF perspective.  - Continue spironolactone25mg  daily.  -ContinueEntresto to 97/103 bid - Continue amlodipine 5 mg daily. - Continue ASA 81 daily.  - Continue warfarin for INR 2-2.5.  - INR 1.8. Continue heparin drip, likely stop tomorrow. Continue coumadin.   2. Smoking: Still smoking a few cigs/day. Using nicotine patches. Knows he needs to quit to be a transplant candidate.  3. RLE DVT: On heparin and restarting coumadin.  4. OSA: Continue CPAP.No change. 5. Hyperlipidemia: Atorvastatin.  6. Type II diabetes: Metformin. Continue sliding scale.  7. HTN Stable  8.Rash -?Fungal infection under driveline dressing - Started diflucan 11/23/2018. Continue oral diflucan.   No plan for surgical debridement.   I reviewed the LVAD parameters from today, and compared the results to the patient's prior recorded data.  No programming changes were made.  The LVAD is functioning within specified parameters.  The  patient performs LVAD self-test daily.  LVAD interrogation was negative for any significant power changes, alarms or PI events/speed drops.  LVAD equipment check completed and is in good working order.  Back-up equipment present.   LVAD education done on emergency procedures and precautions and reviewed exit site care.  Length of Stay: 3  Amy Clegg, NP 11/26/2018, 7:27 AM  VAD Team --- VAD ISSUES ONLY--- Pager [phone] (7am - 7am)  Advanced Heart Failure Team  Pager [phone] (M-F; 7a - 4p)  Please contact [facility] for night-coverage after hours (4p -7a ) and weekends on [email or web address]  Patient seen with NP, agree with the above note.   Would management as above.  He has MSSA, so transitioning antibiotics  to cefazolin.  He has PICC in place.  No surgical debridement.  INR 1.8 today, coumadin restarted.  Stop heparin gtt probably tomorrow.   Will monitor wound for an additional day.  If site is improving, possibly home tomorrow.   Marca AnconaDalton Keerthi Hazell 11/26/2018 10:33 AM

## 2018-11-26 NOTE — Progress Notes (Addendum)
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 [name].   HM III LVAD implanted on 08/03/18 by Dr. Cyndia Bent under Destination Therapy criteria due to smoking status.  Pt awake, denies complaints. Darrick Grinder, NP at bedside, assessing wounds. VAD Coordinator will change dressings.  Vital signs: Temp:  98.0 HR: 92 Doppler Pressure:  90 Automatic BP:  103/69 (81) O2 Sat: 94% on RA Wt:  157>160>159.6 lbs   LVAD interrogation reveals:  Speed: 5700 Flow: 4.7 Power:  4.3w PI: 3.4 Alarms: none Events:  15 PI events on 11/25/17 Hematocrit: 33  Fixed speed: 5700 Low speed limit:  5400   Drive Line site: Existing VAD dressing removed and site care performed using sterile technique. Drive line exit site cleaned with betadine swab, allowed to dry, and gauze dressing with silver strip re-applied. Exit site incorporated, but small opening around exit site, the velour is fully implanted at exit site. Small amount yellow drainage with no redness, tenderness, or foul odor noted; rash much improved. Skin prep and tape supplied in kit used. Drive line anchor re-applied. Daily dressing changes using daily kit with silver strip required.  Periumbilical counterincision site: Existing dressing removed and site care performed using sterile technique. Site cleaned with betadine swab, allowed to dry and re-packed with aquacel silver strip, then covered with large aquacel and gauze dressing. Moderate amount yellow drainage from site, no redness, tenderness, or foul odor noted. Twice daily dressing changes required.           Labs:  LDH trend: 165>127>128>120  INR trend: 1.56>1.61>1.70>1.83  Anticoagulation Plan: -INR Goal:  2.0 - 2.5  -ASA Dose:  81 mg daily - Coumadin re-started 11/24/18  Device: N/A - no ICD  Infection:  - BC's x 2 11/23/18>>NTD - LVAD Wound culture 11/24/18>>staphylococcus aureus   Plan/Recommendations:  1. Contact VAD Coordinator for any  VAD equipment or driveline issues. 2. Daily drive line dressing changes; twice daily periumbilical counterincision site dressing changes.VAD Coordinator will change today.    Zada Girt RN, VAD Coordinator 24/7 VAD Pager: [phone]

## 2018-11-26 NOTE — Progress Notes (Signed)
PHARMACY CONSULT NOTE FOR:  OUTPATIENT  PARENTERAL ANTIBIOTIC THERAPY (OPAT)  Indication: LVAD-associated MSSA infection Regimen: Cefazolin 2g IV q8h End date: 12/21/2018  IV antibiotic discharge orders are pended. To discharging provider:  please sign these orders via discharge navigator,  Select New Orders & click on the button choice - Manage This Unsigned Work.    Thank you for allowing pharmacy to be a part of this patient's care.  Roderic Scarce Zigmund Daniel, PharmD, BCPS PGY2 Infectious Diseases Pharmacy Resident Phone: [phone] 11/26/2018, 12:31 PM

## 2018-11-26 NOTE — Progress Notes (Signed)
Procedure(s) (LRB): ABDOMINAL WOUND DEBRIDEMENT (N/A) APPLICATION OF WOUND VAC (N/A) Subjective: The patient's abdominal wound personally examined and dressing change performed There is a tunneled space from the periumbilical incision to the power cord exit site.  I placed a long 3/4 inch strip of Aquacel into the space.  These dressing changes are currently very painful and he required IV medication.  He needs to remain hospitalized for wound care until the infection starts showing improvement and until effective wound care can be performed at home without IV pain medicine.  Objective: Vital signs in last 24 hours: Temp:  [97.4 F (36.3 C)-98 F (36.7 C)] 98 F (36.7 C) (01/09 1151) Pulse Rate:  [64-92] 92 (01/09 1151) Cardiac Rhythm: Normal sinus rhythm (01/09 1151) Resp:  [15-27] 20 (01/09 1151) BP: (91-114)/(49-81) 91/57 (01/09 1151) SpO2:  [93 %-100 %] 94 % (01/09 1151) Weight:  [72.4 kg] 72.4 kg (01/09 0409)  Hemodynamic parameters for last 24 hours:    Intake/Output from previous day: 01/08 0701 - 01/09 0700 In: 681.3 [P.O.:240; I.V.:241.3; IV Piggyback:200] Out: -  Intake/Output this shift: Total I/O In: 16 [I.V.:16] Out: -   Indurated tissue remains in the anterior abdominal wall There is a tunnel space between the umbilical incision and the exit site Normal VAD hum  Lab Results: Recent Labs    11/25/18 0818 11/26/18 0220  WBC 8.1 8.6  HGB 10.6* 10.2*  HCT 34.8* 32.6*  PLT 324 349   BMET:  Recent Labs    11/25/18 0818 11/26/18 0220  NA 141 140  K 4.1 3.9  CL 111 109  CO2 23 24  GLUCOSE 103* 103*  BUN 5* 7  CREATININE 0.87 0.92  CALCIUM 9.1 9.1    PT/INR:  Recent Labs    11/26/18 0220  LABPROT 20.9*  INR 1.83   ABG    Component Value Date/Time   PHART 7.473 (H) 08/05/2018 0345   HCO3 26.9 08/05/2018 0345   TCO2 26 08/04/2018 1622   O2SAT 73.6 08/12/2018 0345   CBG (last 3)  Recent Labs    11/25/18 2135 11/26/18 0818  11/26/18 1147  GLUCAP 106* 97 116*    Assessment/Plan: S/P Procedure(s) (LRB): ABDOMINAL WOUND DEBRIDEMENT (N/A) APPLICATION OF WOUND VAC (N/A) Abdominal wall infection-cellulitis with a tunnel space between the periumbilical incision and the power cord exit site.  MSSA staph cultured from wound.  Continue daily dressing changes and IV Ancef.  It does not appear the patient needs a wound VAC at this time but there is still a realistic chance this will be necessary   LOS: 3 days    [name] 11/26/2018

## 2018-11-26 NOTE — Progress Notes (Signed)
Patient ID: ZAVEN KLEMENS, male   DOB: July 22, 1961, 58 y.o.   MRN: [id number]         [facility]  Date of Admission:  11/23/2018           Day 4 MSSA therapy        Day 4 fluconazole ASSESSMENT: He has MSSA soft tissue infection and his counterincision site adjacent to his LVAD and driveline.  Improving on cefazolin.  Prospective data or guidelines to help determine optimal duration of therapy but I agree that he will need a long course of treatment.  I will plan on at least 4 weeks of IV cefazolin.  I will see him back in my clinic at that time to determine if he needs a longer course, conversion to oral cephalexin, or if it seems reasonable to stop at that time.  PLAN: 1. Continue cefazolin at least 24 more days 2. Continue fluconazole for 3 more days 3. I will arrange follow-up in my clinic and sign off now  Diagnosis: LVAD associated infection  Culture Result: MSSA  No Known Allergies  OPAT Orders Discharge antibiotics: Per pharmacy protocol cefazolin  Duration: 4 weeks End Date: 12/21/2018  [facility] Care Per Protocol:  Labs weekly while on IV antibiotics: _x_ CBC with differential _x_ BMP __ CMP __ CRP __ ESR __ Vancomycin trough __ CK  __ Please pull PIC at completion of IV antibiotics _x_ Please leave PIC in place until doctor has seen patient or been notified  Fax weekly labs to [phone]  Clinic Follow Up Appt: 12/21/2018  Principal Problem:   Complication involving left ventricular assist device (LVAD) Active Problems:   HTN (hypertension)   Tobacco use   Congestive heart failure (HCC)   DVT (deep venous thrombosis) (HCC)   OSA (obstructive sleep apnea)   Scheduled Meds: . amLODipine  5 mg Oral Daily  . aspirin EC  81 mg Oral Daily  . atorvastatin  20 mg Oral Daily  . carbamide peroxide  5 drop Both EARS TID  . DULoxetine  60 mg Oral Daily  . fluconazole  100 mg Oral Daily  . gabapentin  600 mg Oral TID  . insulin  aspart  0-9 Units Subcutaneous TID WC  . metFORMIN  500 mg Oral BID WC  . nystatin   Topical BID  . pantoprazole  40 mg Oral Daily  . pneumococcal 23 valent vaccine  0.5 mL Intramuscular Tomorrow-1000  . sacubitril-valsartan  1 tablet Oral BID  . sodium chloride flush  10-40 mL Intracatheter Q12H  . spironolactone  25 mg Oral Daily  . thiamine  100 mg Oral Daily  . vitamin B-12  1,000 mcg Oral Daily  . warfarin  7.5 mg Oral ONCE-1800  . Warfarin - Pharmacist Dosing Inpatient   Does not apply q1800   Continuous Infusions: . sodium chloride Stopped (11/23/18 1702)  .  ceFAZolin (ANCEF) IV 2 g (11/26/18 0617)  . heparin 1,600 Units/hr (11/26/18 1100)   PRN Meds:.sodium chloride, acetaminophen, diphenhydrAMINE, fluticasone, hydrOXYzine, ipratropium-albuterol, methocarbamol, ondansetron (ZOFRAN) IV, oxyCODONE, sodium chloride flush, traMADol   SUBJECTIVE: He is feeling well.  Review of Systems: Review of Systems  Constitutional: Negative for chills, diaphoresis and fever.  Gastrointestinal: Negative for abdominal pain.  Skin: Positive for itching and rash.    No Known Allergies  OBJECTIVE: Vitals:   11/25/18 2323 11/26/18 0409 11/26/18 0813 11/26/18 1151  BP:  (!) 101/55 103/69 (!) 91/57  Pulse:   84  92  Resp:  (!) '27 20 20  '$ Temp: (!) 97.4 F (36.3 C) 97.9 F (36.6 C) 98 F (36.7 C) 98 F (36.7 C)  TempSrc: Oral Oral Oral Oral  SpO2:   93% 94%  Weight:  72.4 kg    Height:       Body mass index is 22.9 kg/m.  Physical Exam Constitutional:      Comments: He is in good spirits.  Cardiovascular:     Comments: LVAD hum present. Pulmonary:     Effort: Pulmonary effort is normal.     Breath sounds: Normal breath sounds.  Abdominal:     Palpations: Abdomen is soft.     Tenderness: There is no abdominal tenderness.     Comments: Clean dry gauze dressing over abdominal wound and driveline exit site.       Lab Results Lab Results  Component Value Date   WBC 8.6  11/26/2018   HGB 10.2 (L) 11/26/2018   HCT 32.6 (L) 11/26/2018   MCV 81.1 11/26/2018   PLT 349 11/26/2018    Lab Results  Component Value Date   CREATININE 0.92 11/26/2018   BUN 7 11/26/2018   NA 140 11/26/2018   K 3.9 11/26/2018   CL 109 11/26/2018   CO2 24 11/26/2018    Lab Results  Component Value Date   ALT 12 10/28/2018   AST 22 10/28/2018   ALKPHOS 149 (H) 10/28/2018   BILITOT 0.6 10/28/2018     Microbiology: Recent Results (from the past 240 hour(s))  Culture, blood (routine x 2)     Status: None (Preliminary result)   Collection Time: 11/23/18  9:13 AM  Result Value Ref Range Status   Specimen Description BLOOD RIGHT FOREARM  Final   Special Requests   Final    BOTTLES DRAWN AEROBIC AND ANAEROBIC Blood Culture adequate volume   Culture   Final    NO GROWTH 3 DAYS Performed at [facility], [address], [place], [place] [address]    Report Status PENDING  Incomplete  Culture, blood (Routine X 2) w Reflex to ID Panel     Status: None (Preliminary result)   Collection Time: 11/23/18  9:17 AM  Result Value Ref Range Status   Specimen Description BLOOD LEFT FOREARM  Final   Special Requests   Final    BOTTLES DRAWN AEROBIC AND ANAEROBIC Blood Culture adequate volume   Culture   Final    NO GROWTH 3 DAYS Performed at [facility], [place] [address] [place], [place] [address]    Report Status PENDING  Incomplete  Aerobic Culture (superficial specimen)     Status: None   Collection Time: 11/23/18 10:27 AM  Result Value Ref Range Status   Specimen Description WOUND LVAD  Final   Special Requests NONE  Final   Gram Stain   Final    RARE WBC PRESENT, PREDOMINANTLY PMN RARE GRAM POSITIVE COCCI Performed at [facility], [place] [address] [place], [place] [address]    Culture FEW STAPHYLOCOCCUS AUREUS  Final   Report Status 11/25/2018 FINAL  Final   Organism ID, Bacteria STAPHYLOCOCCUS AUREUS  Final      Susceptibility   Staphylococcus aureus  - MIC*    CIPROFLOXACIN <=0.5 SENSITIVE Sensitive     ERYTHROMYCIN >=8 RESISTANT Resistant     GENTAMICIN <=0.5 SENSITIVE Sensitive     OXACILLIN <=0.25 SENSITIVE Sensitive     TETRACYCLINE <=1 SENSITIVE Sensitive  VANCOMYCIN <=0.5 SENSITIVE Sensitive     TRIMETH/SULFA <=10 SENSITIVE Sensitive     CLINDAMYCIN RESISTANT Resistant     RIFAMPIN <=0.5 SENSITIVE Sensitive     Inducible Clindamycin POSITIVE Resistant     * FEW STAPHYLOCOCCUS AUREUS    Michel Bickers, MD [facility] [place] [facility] [phone] pager   [phone] cell 11/26/2018, 11:57 AM

## 2018-11-26 NOTE — Care Management Important Message (Signed)
Important Message  Patient Details  Name: Theodore Welch MRN: [id number] Date of Birth: 1961/03/24   Medicare Important Message Given:  Yes    Qusai [place] P Jacki Couse 11/26/2018, 4:24 PM

## 2018-11-26 NOTE — Progress Notes (Signed)
Peripherally Inserted Central Catheter/Midline Placement  The IV Nurse has discussed with the patient and/or persons authorized to consent for the patient, the purpose of this procedure and the potential benefits and risks involved with this procedure.  The benefits include less needle sticks, lab draws from the catheter, and the patient may be discharged home with the catheter. Risks include, but not limited to, infection, bleeding, blood clot (thrombus formation), and puncture of an artery; nerve damage and irregular heartbeat and possibility to perform a PICC exchange if needed/ordered by physician.  Alternatives to this procedure were also discussed.  Bard Power PICC patient education guide, fact sheet on infection prevention and patient information card has been provided to patient /or left at bedside.    PICC/Midline Placement Documentation        Theodore Welch 11/26/2018, 9:40 AM

## 2018-11-26 NOTE — Plan of Care (Signed)

## 2018-11-26 NOTE — Progress Notes (Addendum)
ANTICOAGULATION CONSULT NOTE - Follow-Up Consult  Pharmacy Consult for heparin + warfarin Indication: LVAD  No Known Allergies  Patient Measurements: Height: 5\' 10"  (177.8 cm) Weight: 159 lb 9.8 oz (72.4 kg) IBW/kg (Calculated) : 73 Heparin Dosing Weight: 73kg  Vital Signs: Temp: 98 F (36.7 C) (01/09 0813) Temp Source: Oral (01/09 0813) BP: 103/69 (01/09 0813) Pulse Rate: 84 (01/09 0813)  Labs: Recent Labs    11/24/18 0223 11/24/18 0918 11/25/18 0818 11/26/18 0220  HGB 10.7*  --  10.6* 10.2*  HCT 34.4*  --  34.8* 32.6*  PLT 333  --  324 349  LABPROT 18.9*  --  19.8* 20.9*  INR 1.61  --  1.70 1.83  HEPARINUNFRC  --  0.20* 0.46 0.31  CREATININE 0.87  --  0.87 0.92    Estimated Creatinine Clearance: 88.5 mL/min (by C-G formula based on SCr of 0.92 mg/dL).   Medical History: Past Medical History:  Diagnosis Date  . Cardiomyopathy, unspecified (HCC)   . CHF (congestive heart failure) (HCC)   . Chronic back pain   . Enlarged heart   . Gastric ulcer   . Gastroenteritis   . H/O degenerative disc disease     Assessment: 76 yoM with LVAD admitted with driveline infection. INR was subtherapeutic on admit so heparin started and warfarin held for now in case procedures are needed involving wound.   Warfarin resumed 1/7, INR subtherapeutic as expected but trending up. Per MD, continue heparin likely one more day. LDH and CBC stable.  *PTA Warfarin Dose = 7.5mg  Tues/Thurs/Sat/Sun, 5mg  Mon/Wed/Fri  Goal of Therapy:  Heparin level 0.3-0.7 units/ml  INR 2-2.5 Monitor platelets by anticoagulation protocol: Yes   Plan:  -Continue heparin 1600 units/hr - can likely stop tomorrow -Warfarin 7.5mg  PO x1 tonight -Daily INR, CBC, LDH  Fredonia Highland, PharmD, BCPS Clinical Pharmacist [phone] Please check AMION for all [facility] Pharmacy numbers 11/26/2018

## 2018-11-27 ENCOUNTER — Ambulatory Visit (HOSPITAL_COMMUNITY): Payer: Medicare Other

## 2018-11-27 LAB — GLUCOSE, CAPILLARY
GLUCOSE-CAPILLARY: 64 mg/dL — AB (ref 70–99)
GLUCOSE-CAPILLARY: 89 mg/dL (ref 70–99)
Glucose-Capillary: 66 mg/dL — ABNORMAL LOW (ref 70–99)
Glucose-Capillary: 104 mg/dL — ABNORMAL HIGH (ref 70–99)
Glucose-Capillary: 85 mg/dL (ref 70–99)

## 2018-11-27 LAB — PROTIME-INR
INR: 2.05
Prothrombin Time: 22.8 seconds — ABNORMAL HIGH (ref 11.4–15.2)

## 2018-11-27 LAB — HEPARIN LEVEL (UNFRACTIONATED): Heparin Unfractionated: 0.39 IU/mL (ref 0.30–0.70)

## 2018-11-27 LAB — CBC
HCT: 31.2 % — ABNORMAL LOW (ref 39.0–52.0)
Hemoglobin: 9.5 g/dL — ABNORMAL LOW (ref 13.0–17.0)
MCH: 25.1 pg — ABNORMAL LOW (ref 26.0–34.0)
MCHC: 30.4 g/dL (ref 30.0–36.0)
MCV: 82.5 fL (ref 80.0–100.0)
Platelets: 337 10*3/uL (ref 150–400)
RBC: 3.78 MIL/uL — ABNORMAL LOW (ref 4.22–5.81)
RDW: 16.2 % — ABNORMAL HIGH (ref 11.5–15.5)
WBC: 9.2 10*3/uL (ref 4.0–10.5)
nRBC: 0 % (ref 0.0–0.2)

## 2018-11-27 LAB — BASIC METABOLIC PANEL
Anion gap: 6 (ref 5–15)
BUN: 6 mg/dL (ref 6–20)
CO2: 27 mmol/L (ref 22–32)
CREATININE: 0.92 mg/dL (ref 0.61–1.24)
Calcium: 8.8 mg/dL — ABNORMAL LOW (ref 8.9–10.3)
Chloride: 106 mmol/L (ref 98–111)
GFR calc Af Amer: >60 mL/min (ref >60)
GFR calc non Af Amer: >60 mL/min (ref >60)
Glucose, Bld: 84 mg/dL (ref 70–99)
Potassium: 3.9 mmol/L (ref 3.5–5.1)
Sodium: 139 mmol/L (ref 135–145)

## 2018-11-27 LAB — LACTATE DEHYDROGENASE: LDH: 140 U/L (ref 98–192)

## 2018-11-27 MED ORDER — HYDROMORPHONE HCL 1 MG/ML IJ SOLN
1.0000 mg | Freq: Two times a day (BID) | INTRAMUSCULAR | Status: DC | PRN
  Administered 2018-11-28 – 2018-12-07 (×9): 1 mg via INTRAVENOUS
  Filled 2018-11-27 (×11): qty 1

## 2018-11-27 MED ORDER — WARFARIN SODIUM 5 MG PO TABS
5.0000 mg | ORAL_TABLET | Freq: Once | ORAL | Status: AC
  Administered 2018-11-27: 5 mg via ORAL
  Filled 2018-11-27: qty 1

## 2018-11-27 NOTE — Care Management Note (Addendum)
Case Management Note  Patient Details  Name: BRADEN FURLONG MRN: [id number] Date of Birth: [birth year]  Subjective/Objective:   Pt admitted with drive line infection of LVAD                 Action/Plan:   PTA Independent from home.  Pt in agreement with discharging home on IV antibiotics.  CM provided Medicare .gov HH list and choice for DME - pt in agreement with [facility] - agency contacted and referral accepted for both [facility] and DME.   Expected Discharge Date:                  Expected Discharge Plan:  Home w Home Health Services  In-House Referral:     Discharge planning Services  CM Consult  Post Acute Care Choice:    Choice offered to:  Patient  DME Arranged:  IV pump/equipment DME Agency:  [facility]  HH Arranged:  RN [facility] Agency:  [facility]  Status of Service:  In process, will continue to follow  If discussed at Long Length of Stay Meetings, dates discussed:    Additional Comments:  Cherylann Parr, RN 11/27/2018, 9:27 AM

## 2018-11-27 NOTE — Plan of Care (Signed)

## 2018-11-27 NOTE — Progress Notes (Addendum)
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 [name].   HM III LVAD implanted on 08/03/18 by Dr. Cyndia Bent under Destination Therapy criteria due to smoking status.  Pt sitting up on side of bed reading a book. He says his pain is a 5/10 this morning, but otherwise he feels ok. He plans on going for a walk this morning.   Vital signs: Temp:  97.9 HR: 85 Doppler Pressure:  82 Automatic BP:  103/88 (94) O2 Sat: 94% on RA Wt:  157>160>159.6>162.0 lbs   LVAD interrogation reveals:  Speed: 5700 Flow: 4.6 Power:  4.5w PI: 3.2 Alarms: none Events:  7 PI events today Hematocrit: 32  Fixed speed: 5700 Low speed limit:  5400   Drive Line site: Existing VAD dressing removed and site care performed using sterile technique. Drive line exit site cleaned with betadine swab, allowed to dry, and gauze dressing with silver strip re-applied. Exit site incorporated, but small opening around exit site, the velour is fully implanted at exit site. Small amount yellow drainage with no redness, tenderness, or foul odor noted; rash much improved. Skin prep and tape supplied in kit used. Drive line anchor re-applied. Daily dressing changes using daily kit with silver strip required.  Periumbilical counterincision site:  Existing dressing removed and site care performed using sterile technique. Site cleaned with betadine swab, allowed to dry and re-packed with aquacel silver strip, then covered with large aquacel and gauze dressing. Moderate amount yellow drainage from site, no redness, tenderness, or foul odor noted.  Daily dressing changes required per Dr. Prescott Gum.    Labs:  LDH trend: 165>127>128>120>140  INR trend: 1.56>1.61>1.70>1.83>2.05  Anticoagulation Plan: -INR Goal:  2.0 - 2.5  -ASA Dose:  81 mg daily - Coumadin re-started 11/24/18  Device: N/A - no ICD  Infection:  - BC's x 2 11/23/18>>NTD - LVAD Wound culture 11/24/18>>staphylococcus  aureus   Plan/Recommendations:  1. Contact VAD Coordinator for any VAD equipment or driveline issues. 2. Daily drive line dressing changes;daily periumbilical counterincision site dressing changes. VAD coordinator to change dressings over the weekend.    Emerson Monte RN Pink Coordinator  Office: [phone]  24/7 Pager: [phone]

## 2018-11-27 NOTE — Telephone Encounter (Signed)
Original request denied, redetermination information faxed as original PA was answered incorrectly.

## 2018-11-27 NOTE — Progress Notes (Signed)
ANTICOAGULATION CONSULT NOTE - Follow-Up Consult  Pharmacy Consult for heparin + warfarin Indication: LVAD  No Known Allergies  Patient Measurements: Height: 5\' 10"  (177.8 cm) Weight: 162 lb 0.6 oz (73.5 kg) IBW/kg (Calculated) : 73 Heparin Dosing Weight: 73kg  Vital Signs: Temp: 98 F (36.7 C) (01/10 0348) Temp Source: Oral (01/10 0348) BP: 103/88 (01/10 0800)  Labs: Recent Labs    11/25/18 0818 11/26/18 0220 11/27/18 0435  HGB 10.6* 10.2* 9.5*  HCT 34.8* 32.6* 31.2*  PLT 324 349 337  LABPROT 19.8* 20.9* 22.8*  INR 1.70 1.83 2.05  HEPARINUNFRC 0.46 0.31 0.39  CREATININE 0.87 0.92 0.92    Estimated Creatinine Clearance: 89.3 mL/min (by C-G formula based on SCr of 0.92 mg/dL).   Medical History: Past Medical History:  Diagnosis Date  . Cardiomyopathy, unspecified (HCC)   . CHF (congestive heart failure) (HCC)   . Chronic back pain   . Enlarged heart   . Gastric ulcer   . Gastroenteritis   . H/O degenerative disc disease     Assessment: 19 yoM with LVAD admitted with driveline infection. INR was subtherapeutic on admit so heparin started and warfarin held for now in case procedures are needed involving wound.   Warfarin resumed 1/7, INR now therapeutic at 2.05. Heparin has been stopped and CBC is stable. Fluconazole course to end after tomorrow's dose.  *PTA Warfarin Dose = 7.5mg  Tues/Thurs/Sat/Sun, 5mg  Mon/Wed/Fri  Goal of Therapy:  INR 2-2.5 Monitor platelets by anticoagulation protocol: Yes   Plan:  -Warfarin 5mg  PO x1 tonight -Daily INR, CBC, LDH  Fredonia Highland, PharmD, BCPS Clinical Pharmacist [phone] Please check AMION for all [facility] Pharmacy numbers 11/27/2018

## 2018-11-27 NOTE — Progress Notes (Addendum)
Advanced Heart Failure VAD Team Note  PCP-Cardiologist: No primary care provider on file.   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 Initially on  vancomycin and cefepime. Antibiotics narrowed to cefazolin. Continuing on po diflucan.     Denies SOB. Having some abdominal tenderness.   Wound CX- Staph aureus -->MSSA Blood CX- NGTD.    LVAD INTERROGATION:  HeartMate III LVAD:   Flow 4.7  liters/min, speed 5700 , power 4.4  PI 3.1  Objective:    Vital Signs:   Temp:  [97.7 F (36.5 C)-98 F (36.7 C)] 98 F (36.7 C) (01/10 0348) Pulse Rate:  [82-92] 82 (01/09 2000) Resp:  [17-25] 25 (01/10 0348) BP: (89-103)/(57-69) 94/57 (01/10 0348) SpO2:  [91 %-94 %] 94 % (01/09 2333) Weight:  [73.5 kg] 73.5 kg (01/10 0556) Last BM Date: 11/26/18 Mean arterial Pressure 80s   Intake/Output:   Intake/Output Summary (Last 24 hours) at 11/27/2018 0743 Last data filed at 11/27/2018 0605 Gross per 24 hour  Intake 623.02 ml  Output -  Net 623.02 ml     Physical Exam    Physical Exam: GENERAL: NAD . HEENT: normal  NECK: Supple, JVP 5-6   .  2+ bilaterally, no bruits.  No lymphadenopathy or thyromegaly appreciated.   CARDIAC:  Mechanical heart sounds with LVAD hum present.  LUNGS:  Clear to auscultation bilaterally.  ABDOMEN:  Soft, round, nontender, positive bowel sounds x4.     LVAD exit site:   Dressing dry and intact.    Stabilization device present and accurately applied.  Driveline dressing is being changed daily per sterile technique. EXTREMITIES:  Warm and dry, no cyanosis, clubbing, rash or edema . RUE PICC NEUROLOGIC:  Alert and oriented x 4.   No aphasia.  No dysarthria.  Affect pleasant.    SKin: Abdominal wound dressing intact       Telemetry  NSR 70 personally reviewed.    EKG    N/A  Labs   Basic Metabolic Panel: Recent Labs  Lab 11/23/18 0900 11/24/18 0223 11/25/18 0818 11/26/18 0220 11/27/18 0435    NA 138 139 141 140 139  K 4.0 3.5 4.1 3.9 3.9  CL 105 109 111 109 106  CO2 18* 20* 23 24 27   GLUCOSE 250* 110* 103* 103* 84  BUN 11 8 5* 7 6  CREATININE 0.95 0.87 0.87 0.92 0.92  CALCIUM 9.2 9.1 9.1 9.1 8.8*    Liver Function Tests: No results for input(s): AST, ALT, ALKPHOS, BILITOT, PROT, ALBUMIN in the last 168 hours. No results for input(s): LIPASE, AMYLASE in the last 168 hours. No results for input(s): AMMONIA in the last 168 hours.  CBC: Recent Labs  Lab 11/23/18 0900 11/24/18 0223 11/25/18 0818 11/26/18 0220 11/27/18 0435  WBC 10.4 10.0 8.1 8.6 9.2  HGB 11.6* 10.7* 10.6* 10.2* 9.5*  HCT 39.9 34.4* 34.8* 32.6* 31.2*  MCV 84.2 80.6 80.0 81.1 82.5  PLT 312 333 324 349 337    INR: Recent Labs  Lab 11/23/18 0900 11/24/18 0223 11/25/18 0818 11/26/18 0220 11/27/18 0435  INR 1.56 1.61 1.70 1.83 2.05    Other results:  EKG:    Imaging   [name] Ekg Site Rite  Result Date: 11/25/2018 If Site Rite image not attached, placement could not be confirmed due to current cardiac rhythm.    Medications:     Scheduled Medications: . amLODipine  5 mg Oral Daily  . aspirin EC  81 mg Oral Daily  . atorvastatin  20 mg Oral Daily  . carbamide peroxide  5 drop Both EARS TID  . DULoxetine  60 mg Oral Daily  . fluconazole  100 mg Oral Daily  . gabapentin  600 mg Oral TID  . insulin aspart  0-9 Units Subcutaneous TID WC  . metFORMIN  500 mg Oral BID WC  . nystatin   Topical BID  . pantoprazole  40 mg Oral Daily  . pneumococcal 23 valent vaccine  0.5 mL Intramuscular Tomorrow-1000  . sacubitril-valsartan  1 tablet Oral BID  . sodium chloride flush  10-40 mL Intracatheter Q12H  . spironolactone  25 mg Oral Daily  . thiamine  100 mg Oral Daily  . vitamin B-12  1,000 mcg Oral Daily  . Warfarin - Pharmacist Dosing Inpatient   Does not apply q1800    Infusions: . sodium chloride Stopped (11/23/18 1702)  .  ceFAZolin (ANCEF) IV Stopped (11/27/18 [id number])    PRN  Medications: sodium chloride, acetaminophen, diphenhydrAMINE, fluticasone, hydrOXYzine, ipratropium-albuterol, methocarbamol, ondansetron (ZOFRAN) IV, oxyCODONE, sodium chloride flush, traMADol   Patient Profile  58 yo with history of nonischemic cardiomyopathy, RLE DVT, cirrhosis, smoking, OSA, and HMIII LVAD 07/2018.   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    Assessment/Plan:   1. 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  Wound culture obtained by Dr Maren BeachVantrigt in the VAD clinic. Blood CX x2 obtained.  CT with small amount of inflammation and fluid along the driveline.  Continue twice daily dressing to abdominal wound.  - Blood CX -NGTD. - Wound CX- Few Staphylococcus Aureus  --> MSSA.  Transitioned to cefazolin per ID, will need prolonged course to heal.   PICC line placed 11/26/2018.   -[facility]  - No plan for debridement per Dr Maren BeachVantrigt.  2. 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  - VAD parameters stable. LVAD parameters stable except for frequent PI events (no low flows).  - He does not need Lasix. Remains stable from HF perspective.  - Continue spironolactone25mg  daily.  -ContinueEntresto to 97/103 bid - Continue amlodipine 5 mg daily. - Continue ASA 81 daily.  - Continue warfarin for INR 2-2.5.  - INR 2. Continue coumadin. .   2. Smoking: Still smoking a few cigs/day. Using nicotine patches. Knows he needs to quit to be a transplant candidate.  3. RLE DVT: On heparin and restarting coumadin.  4. OSA: Continue CPAP.No change. 5. Hyperlipidemia: Atorvastatin.  6. Type II diabetes: Metformin. Continue sliding scale.  7. HTN Stable  8.Rash -?Fungal infection under driveline dressing - Started diflucan 11/23/2018. Continue diflucan for 2 more days.   ID appreciated. Timing discharge per Dr Donata ClayVan Trigt  I reviewed the LVAD parameters from today, and compared the results to the patient's prior  recorded data.  No programming changes were made.  The LVAD is functioning within specified parameters.  The patient performs LVAD self-test daily.  LVAD interrogation was negative for any significant power changes, alarms or PI events/speed drops.  LVAD equipment check completed and is in good working order.  Back-up equipment present.   LVAD education done on emergency procedures and precautions and reviewed exit site care.  Length of Stay: 4  Tonye BecketAmy Clegg, NP 11/27/2018, 7:43 AM  VAD Team --- VAD ISSUES ONLY--- Pager [phone] (7am - 7am)  Advanced Heart Failure Team  Pager [phone] (M-F; 7a - 4p)  Please contact [facility] for night-coverage after hours (4p -7a )  and weekends on [email or web address]  Patient seen with NP, agree with the above note.   Dressing changes remain painful, he has been getting IV medication.  No debridement or wound vac at this time, but may need wound vac.   On exam, normal LVAD sounds and no JVD.  Clear lungs.   He is on IV cefazolin for MSSA wound infection.  Will need inpatient management until wound shows improvement and he does not need IV medication for dressing changes.  Appreciate Dr. Zenaida Niece Trigt's assistance.   Marca Ancona 11/27/2018 8:48 AM

## 2018-11-27 NOTE — Discharge Summary (Signed)
Advanced Heart Failure Team  Discharge Summary   Patient ID: Theodore Welch MRN: [id number], DOB/AGE: 60/05/1961 60 y.o. Admit date: 11/23/2018 D/C date:     12/10/2018   Primary Discharge Diagnoses:  1. LVAD complication, driveline infection  Home antibiotics with cefazolin 2. Chronic Systolic Heart Failure: [facility] with HMIII LVAD 3. Smoker 4. H/O RLE DVT 5. OSA 6. Hyperlipidemia 7. Type II Diabetes 8. HTN 9. Rash   Hospital Course:  58 yo with history of nonischemic cardiomyopathy, RLE DVT, cirrhosis, smoking, OSA, and HMIII LVAD 07/2018.   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 Presented to the VAD clinic with open wound to the right of his driveline. Spontaneously opened and had copious purulent exudate.Blood cultures were obtained and he was placed on antibiotics. Antibiotics were narrowed based on culture results.   He was also followed by Dr Maren BeachVantrigt. Surgical debridement was not recommended. Conservative wound care was pursued. Coumadin was held on admit and heparin drip was started. Coumadin later restarted when it was determined he would not need surgical intervention.   [facility]. He will continue to be followed closely by the VAD team and for weekly dressing changes.   1. 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  Wound culture obtained by Dr Maren BeachVantrigt in the VAD clinic. Blood CX x2 obtained. ID was consulted on admission.  CT with small amount of inflammation and fluid along the driveline.  Continue twice daily dressing to abdominal wound. He had tunnel that extended to his driveline.   - Blood CX -NGTD. - Wound CX- Few Staphylococcus Aureus  --> MSSA.   Transitioned to cefazolin per ID, will need prolonged course to heal.   PICC line placed 11/26/2018.   - [facility]  - No plan for debridement per Dr Maren BeachVantrigt.  2. Chronic systolic CHF: Nonischemic cardiomyopathy, now s/p Heartmate 3 LVAD in  9/19.  - VAD parameters stable. LVAD parameters stable except for frequent PI events (no low flows).  - He does not need Lasix. Remains stable from HF perspective.  - Continue spironolactone25mg  daily.  -ContinueEntresto to 97/103 bid - Continue amlodipine 5 mg daily. - Continue ASA 81 daily.  - Continue warfarin for INR 2-2.5. - INR was followed and monitored daily.   2. Smoking: Still smoking a few cigs/day. Using nicotine patches. Knows he needs to quit to be a transplant candidate.  3. RLE DVT:On heparin and restarting coumadin.  4. OSA: Continue CPAP.No change. 5. Hyperlipidemia: Atorvastatin.  6. Type II diabetes: He was continued on his home regimen and had sliding scale coverage.  7. HTN Stable  8.Rash -?Fungal infection under driveline dressing - Started IV diflucan 11/23/2018 and later had oral diflucan for a few days with improvement in rash.    LVAD Interrogation HM 3:   Speed: 5700    Flow: 4.6     PI: 3.3     Power: 4       Discharge Weight: 165 lbs Discharge Vitals: Blood pressure (!) 82/71, pulse 100, temperature 98.1 F (36.7 C), temperature source Oral, resp. rate 18, height 5\' 10"  (1.778 m), weight 75.1 kg, SpO2 98 %.  Labs: Lab Results  Component Value Date   WBC 6.8 12/10/2018   HGB 9.4 (L) 12/10/2018   HCT 30.6 (L) 12/10/2018   MCV 81.6 12/10/2018   PLT 279 12/10/2018    Recent Labs  Lab 12/10/18 0606  NA 141  K 4.3  CL 108  CO2 27  BUN 10  CREATININE 0.81  CALCIUM 9.0  GLUCOSE 106*   Lab Results  Component Value Date   CHOL 97 07/21/2018   HDL 32 (L) 07/21/2018   LDLCALC 57 07/21/2018   TRIG 39 07/21/2018   BNP (last 3 results) Recent Labs    07/21/18 0540 08/04/18 0902 08/10/18 0026  BNP 1,343.9* 658.6* 537.0*    ProBNP (last 3 results) No results for input(s): PROBNP in the last 8760 hours.   Diagnostic Studies/Procedures   [name] Ekg Site Rite  Result Date: 12/08/2018 If Site Rite image not attached, placement  could not be confirmed due to current cardiac rhythm.  [name] Ekg Site Rite  Result Date: 12/08/2018 If Site Rite image not attached, placement could not be confirmed due to current cardiac rhythm.   Discharge Medications   Allergies as of 12/10/2018   No Known Allergies     Medication List    STOP taking these medications   doxycycline 100 MG capsule Commonly known as:  VIBRAMYCIN   enoxaparin 80 MG/0.8ML injection Commonly known as:  LOVENOX     TAKE these medications   amLODipine 5 MG tablet Commonly known as:  NORVASC Take 1 tablet (5 mg total) by mouth daily.   aspirin EC 81 MG tablet Take 81 mg by mouth daily.   atorvastatin 20 MG tablet Commonly known as:  LIPITOR Take 1 tablet (20 mg total) by mouth daily.   carbamide peroxide 6.5 % OTIC solution Commonly known as:  DEBROX Place 5 drops into both ears 3 (three) times daily. What changed:  additional instructions   ceFAZolin  IVPB Commonly known as:  ANCEF Inject 2 g into the vein every 8 (eight) hours for 25 days. Indication: LVAD-associated MSSA infection Last Day of Therapy:  12/21/2018 Labs - Once weekly:  CBC/D and BMP   docusate sodium 100 MG capsule Commonly known as:  COLACE Take 2 capsules (200 mg total) by mouth daily as needed for mild constipation.   DULoxetine 60 MG capsule Commonly known as:  CYMBALTA Take 60 mg by mouth daily.   fluticasone 50 MCG/ACT nasal spray Commonly known as:  FLONASE Place 1 spray into both nostrils 2 (two) times daily as needed for allergies.   furosemide 40 MG tablet Commonly known as:  LASIX Take 0.5 tablets (20 mg total) by mouth as needed. What changed:  reasons to take this   gabapentin 300 MG capsule Commonly known as:  NEURONTIN Take 600 mg by mouth 3 (three) times daily.   hydrOXYzine 25 MG capsule Commonly known as:  VISTARIL Take 25 mg by mouth 3 (three) times daily as needed for anxiety.   Ipratropium-Albuterol 20-100 MCG/ACT Aers  respimat Commonly known as:  COMBIVENT Inhale 1 puff into the lungs every 6 (six) hours.   metFORMIN 500 MG 24 hr tablet Commonly known as:  GLUCOPHAGE-XR Take 500 mg by mouth 2 (two) times daily.   methocarbamol 750 MG tablet Commonly known as:  ROBAXIN Take 750 mg by mouth 3 (three) times daily as needed for muscle spasms.   nicotine 7 mg/24hr patch Commonly known as:  NICODERM CQ - dosed in mg/24 hr Place 1 patch (7 mg total) onto the skin daily as needed (tobacco craving).   nystatin powder Commonly known as:  MYCOSTATIN/NYSTOP Use with VAD dressing changes daily, advance as directed.   omeprazole 20 MG capsule Commonly known as:  PRILOSEC Take 20 mg by mouth daily.   oxyCODONE 5 MG immediate  release tablet Commonly known as:  Oxy IR/ROXICODONE Take 1 tablet (5 mg total) by mouth every 8 (eight) hours as needed for severe pain.   polyethylene glycol packet Commonly known as:  MIRALAX / GLYCOLAX Take 17 g by mouth daily as needed for mild constipation.   sacubitril-valsartan 97-103 MG Commonly known as:  ENTRESTO Take 1 tablet by mouth 2 (two) times daily.   sildenafil 100 MG tablet Commonly known as:  VIAGRA Take 50 mg by mouth daily as needed for erectile dysfunction.   spironolactone 25 MG tablet Commonly known as:  ALDACTONE Take 1 tablet (25 mg total) by mouth daily.   thiamine 100 MG tablet Take 100 mg by mouth daily.   traMADol 50 MG tablet Commonly known as:  ULTRAM Take 1 tablet (50 mg total) by mouth every 4 (four) hours as needed for moderate pain.   vitamin B-12 500 MCG tablet Commonly known as:  CYANOCOBALAMIN Take 1,000 mcg by mouth daily.   warfarin 5 MG tablet Commonly known as:  COUMADIN Take as directed. If you are unsure how to take this medication, talk to your nurse or doctor. Original instructions:  Take 1 and 1/2 tablets (7.5 mg) daily except 1 tablet (5 mg) on Monday, Wednesday and Friday            Home Infusion Instuctions   (From admission, onward)         Start     Ordered   12/10/18 0000  Home infusion instructions [facility] May follow [facility] Dosing Protocol; May administer Cathflo as needed to maintain patency of vascular access device.; Flushing of vascular access device: per [facility] Protocol: 0.9% NaCl pre/post medica...    Question Answer Comment  Instructions May follow [facility] Dosing Protocol   Instructions May administer Cathflo as needed to maintain patency of vascular access device.   Instructions Flushing of vascular access device: per [facility] Protocol: 0.9% NaCl pre/post medication administration and prn patency; Heparin 100 u/ml, 5ml for implanted ports and Heparin 10u/ml, 5ml for all other central venous catheters.   Instructions May follow [facility] Anaphylaxis Protocol for First Dose Administration in the home: 0.9% NaCl at 25-50 ml/hr to maintain IV access for protocol meds. Epinephrine 0.3 ml IV/IM PRN and Benadryl 25-50 IV/IM PRN s/s of anaphylaxis.   Instructions [facility] Infusion Coordinator (RN) to assist per patient IV care needs in the home PRN.      12/10/18 0851           Durable Medical Equipment  (From admission, onward)         Start     Ordered   11/26/18 1030  Heart failure home health orders  (Heart failure home health orders / Face to face)  Once    Comments:  Heart Failure Follow-up Care:  Verify follow-up appointments per Patient Discharge Instructions. Confirm transportation arranged. Reconcile home medications with discharge medication list. Remove discontinued medications from use. Assist patient/caregiver to manage medications using pill box. Reinforce low sodium food selection Assessments: Vital signs and oxygen saturation at each visit. Assess home environment for safety concerns, caregiver support and availability of low-sodium foods. Consult Child psychotherapist, PT/OT, Dietitian, and CNA based on assessments. Perform comprehensive cardiopulmonary  assessment. Notify MD for any change in condition or weight gain of 3 pounds in one day or 5 pounds in one week with symptoms. Daily Weights and Symptom Monitoring: Ensure patient has access to scales. Teach patient/caregiver to weigh daily before breakfast  and after voiding using same scale and record.    Teach patient/caregiver to track weight and symptoms and when to notify Provider. Activity: Develop individualized activity plan with patient/caregiver.  He has PICC for home antibiotics. Possible d/c 1/10  Question Answer Comment  Heart Failure Follow-up Care [facility] (AHF) Clinic at [phone]   Obtain the following labs Basic Metabolic Panel   Lab frequency Other see comments   Fax lab results to Other see comments   Diet Low Sodium Heart Healthy   Fluid restrictions: See other comments no  limit     11/26/18 1030          Disposition   The patient will be discharged in stable condition to home. Discharge Instructions    Diet - low sodium heart healthy   Complete by:  As directed    Heart Failure patients record your daily weight using the same scale at the same time of day   Complete by:  As directed    Home infusion instructions [facility] May follow [facility] Dosing Protocol; May administer Cathflo as needed to maintain patency of vascular access device.; Flushing of vascular access device: per [facility] Protocol: 0.9% NaCl pre/post medica...   Complete by:  As directed    Instructions:  May follow [facility] Dosing Protocol   Instructions:  May administer Cathflo as needed to maintain patency of vascular access device.   Instructions:  Flushing of vascular access device: per [facility] Protocol: 0.9% NaCl pre/post medication administration and prn patency; Heparin 100 u/ml, 47ml for implanted ports and Heparin 10u/ml, 51ml for all other central venous catheters.   Instructions:  May follow [facility] Anaphylaxis Protocol for First Dose Administration in the home:  0.9% NaCl at 25-50 ml/hr to maintain IV access for protocol meds. Epinephrine 0.3 ml IV/IM PRN and Benadryl 25-50 IV/IM PRN s/s of anaphylaxis.   Instructions:  [facility] Infusion Coordinator (RN) to assist per patient IV care needs in the home PRN.   INR  Goal: 2 - 2.5   Complete by:  As directed    Goal:  2 - 2.5   Increase activity slowly   Complete by:  As directed    Speed Settings:   Complete by:  As directed    Fixed 5700 RPM Low 5400 RPM     Follow-up Information    [facility] - Dme Follow up.   Why:  IV pump and infusion Contact information: [address] [place] [address] [phone]        Health, [facility]-Home Follow up.   Specialty:  Home Health Services Why:  Registered Nurse Contact information: [address] [place] [address] [phone]             Duration of Discharge Encounter: Greater than 35 minutes   Signed, Alford Highland NP-C  12/10/2018, 12:10 PM

## 2018-11-27 NOTE — Progress Notes (Signed)
Pharmacy Antibiotic Note  Theodore Welch is a 58 y.o. 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  Pharmacy initially consulted for vancomycin and cefepime dosing, now narrowed to cefazolin with wound culture growing MSSA. Blood cultures remain negative, pt afebrile, WBC normal. Fluconazole course to end after tomorrow, cefazolin to continue through 2/3 per ID.  Plan: -Cefazolin 2g IV q8h -Monitor S/Sx infection, renal function  Height: 5\' 10"  (177.8 cm) Weight: 162 lb 0.6 oz (73.5 kg) IBW/kg (Calculated) : 73  Temp (24hrs), Avg:97.9 F (36.6 C), Min:97.7 F (36.5 C), Max:98 F (36.7 C)  Recent Labs  Lab 11/23/18 0900 11/24/18 0223 11/25/18 0818 11/26/18 0220 11/27/18 0435  WBC 10.4 10.0 8.1 8.6 9.2  CREATININE 0.95 0.87 0.87 0.92 0.92  VANCOPEAK  --   --  20*  --   --     Estimated Creatinine Clearance: 89.3 mL/min (by C-G formula based on SCr of 0.92 mg/dL).    No Known Allergies  Antimicrobials this admission: Cefazolin 1/8 >> (2/3) Fluconazole 1/6 >> (1/11) Vancomycin 1/6 >>1/8 Cefepime 1/6 >>1/7  Dose adjustments this admission: none  Microbiology results: 1/6 BCx: NGTD 1/6 LVAD Wound Cx: MSSA  Fredonia Highland, PharmD, BCPS Clinical Pharmacist [phone] Please check AMION for all [facility] numbers 11/27/2018

## 2018-11-28 DIAGNOSIS — T827XXA Infection and inflammatory reaction due to other cardiac and vascular devices, implants and grafts, initial encounter: Secondary | ICD-10-CM

## 2018-11-28 DIAGNOSIS — I5022 Chronic systolic (congestive) heart failure: Secondary | ICD-10-CM

## 2018-11-28 LAB — CBC
HCT: 32.8 % — ABNORMAL LOW (ref 39.0–52.0)
Hemoglobin: 9.7 g/dL — ABNORMAL LOW (ref 13.0–17.0)
MCH: 24.2 pg — ABNORMAL LOW (ref 26.0–34.0)
MCHC: 29.6 g/dL — ABNORMAL LOW (ref 30.0–36.0)
MCV: 81.8 fL (ref 80.0–100.0)
Platelets: 341 K/uL (ref 150–400)
RBC: 4.01 MIL/uL — ABNORMAL LOW (ref 4.22–5.81)
RDW: 16.3 % — ABNORMAL HIGH (ref 11.5–15.5)
WBC: 7.4 K/uL (ref 4.0–10.5)
nRBC: 0 % (ref 0.0–0.2)

## 2018-11-28 LAB — CULTURE, BLOOD (ROUTINE X 2)
CULTURE: NO GROWTH
Culture: NO GROWTH
SPECIAL REQUESTS: ADEQUATE
Special Requests: ADEQUATE

## 2018-11-28 LAB — BASIC METABOLIC PANEL
Anion gap: 7 (ref 5–15)
BUN: 5 mg/dL — ABNORMAL LOW (ref 6–20)
CO2: 29 mmol/L (ref 22–32)
Calcium: 9.1 mg/dL (ref 8.9–10.3)
Chloride: 107 mmol/L (ref 98–111)
Creatinine, Ser: 0.83 mg/dL (ref 0.61–1.24)
GFR calc non Af Amer: >60 mL/min (ref >60)
Glucose, Bld: 80 mg/dL (ref 70–99)
Potassium: 4.1 mmol/L (ref 3.5–5.1)
SODIUM: 143 mmol/L (ref 135–145)

## 2018-11-28 LAB — GLUCOSE, CAPILLARY
Glucose-Capillary: 84 mg/dL (ref 70–99)
Glucose-Capillary: 143 mg/dL — ABNORMAL HIGH (ref 70–99)
Glucose-Capillary: 110 mg/dL — ABNORMAL HIGH (ref 70–99)
Glucose-Capillary: 145 mg/dL — ABNORMAL HIGH (ref 70–99)

## 2018-11-28 LAB — PROTIME-INR
INR: 2.35
Prothrombin Time: 25.4 s — ABNORMAL HIGH (ref 11.4–15.2)

## 2018-11-28 LAB — LACTATE DEHYDROGENASE: LDH: 151 U/L (ref 98–192)

## 2018-11-28 MED ORDER — WARFARIN SODIUM 7.5 MG PO TABS
7.5000 mg | ORAL_TABLET | ORAL | Status: DC
  Administered 2018-11-28: 7.5 mg via ORAL
  Filled 2018-11-28: qty 1

## 2018-11-28 MED ORDER — WARFARIN SODIUM 5 MG PO TABS: 5.0000 mg | ORAL_TABLET | ORAL | Status: DC

## 2018-11-28 NOTE — Progress Notes (Signed)
Advanced Heart Failure VAD Team Note  PCP-Cardiologist: No primary care provider on file.   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 Initially on  vancomycin and cefepime. Antibiotics narrowed to cefazolin. Continuing on po diflucan.    Walking hall. Feels good. Afebrile. Still mild ab tenderness.   Wound CX- Staph aureus -->MSSA Blood CX- NGTD.    LVAD INTERROGATION:  HeartMate III LVAD:   Flow 4.9 liters/min, speed 5700 , power 5.0  PI 3.1   Objective:    Vital Signs:   Temp:  [97.3 F (36.3 C)-97.9 F (36.6 C)] 97.9 F (36.6 C) (01/11 0744) Pulse Rate:  [91] 91 (01/11 0744) Resp:  [10-19] 19 (01/11 0744) BP: (91-115)/(52-89) 108/66 (01/11 0744) SpO2:  [96 %-100 %] 100 % (01/11 0744) Weight:  [72.9 kg] 72.9 kg (01/11 0500) Last BM Date: 12/27/18 Mean arterial Pressure 70-80s   Intake/Output:   Intake/Output Summary (Last 24 hours) at 11/28/2018 1323 Last data filed at 11/28/2018 0500 Gross per 24 hour  Intake 460 ml  Output -  Net 460 ml     Physical Exam    Physical Exam: General:  NAD.  HEENT: normal  Neck: supple. JVP not elevated.  Carotids 2+ bilat; no bruits. No lymphadenopathy or thryomegaly appreciated. Cor: LVAD hum.  Lungs: Clear. Abdomen: obese soft, nontender, non-distended. No hepatosplenomegaly. No bruits or masses. Good bowel sounds. Driveline site with bulk dressing in place. Mild TTP.  Extremities: no cyanosis, clubbing, rash. Warm no edema  Neuro: alert & oriented x 3. No focal deficits. Moves all 4 without problem    Telemetry   NSR 70-90 Personally reviewed   EKG    N/A  Labs   Basic Metabolic Panel: Recent Labs  Lab 11/24/18 0223 11/25/18 0818 11/26/18 0220 11/27/18 0435 11/28/18 0533  NA 139 141 140 139 143  K 3.5 4.1 3.9 3.9 4.1  CL 109 111 109 106 107  CO2 20* 23 24 27 29   GLUCOSE 110* 103* 103* 84 80  BUN 8 5* 7 6 5*  CREATININE 0.87 0.87 0.92 0.92 0.83    CALCIUM 9.1 9.1 9.1 8.8* 9.1    Liver Function Tests: No results for input(s): AST, ALT, ALKPHOS, BILITOT, PROT, ALBUMIN in the last 168 hours. No results for input(s): LIPASE, AMYLASE in the last 168 hours. No results for input(s): AMMONIA in the last 168 hours.  CBC: Recent Labs  Lab 11/24/18 0223 11/25/18 0818 11/26/18 0220 11/27/18 0435 11/28/18 0533  WBC 10.0 8.1 8.6 9.2 7.4  HGB 10.7* 10.6* 10.2* 9.5* 9.7*  HCT 34.4* 34.8* 32.6* 31.2* 32.8*  MCV 80.6 80.0 81.1 82.5 81.8  PLT 333 324 349 337 341    INR: Recent Labs  Lab 11/24/18 0223 11/25/18 0818 11/26/18 0220 11/27/18 0435 11/28/18 0533  INR 1.61 1.70 1.83 2.05 2.35    Other results:  EKG:    Imaging   No results found.   Medications:     Scheduled Medications: . amLODipine  5 mg Oral Daily  . aspirin EC  81 mg Oral Daily  . atorvastatin  20 mg Oral Daily  . carbamide peroxide  5 drop Both EARS TID  . DULoxetine  60 mg Oral Daily  . fluconazole  100 mg Oral Daily  . gabapentin  600 mg Oral TID  . insulin aspart  0-9 Units Subcutaneous TID WC  . metFORMIN  500 mg Oral BID WC  . nystatin  Topical BID  . pantoprazole  40 mg Oral Daily  . sacubitril-valsartan  1 tablet Oral BID  . sodium chloride flush  10-40 mL Intracatheter Q12H  . spironolactone  25 mg Oral Daily  . thiamine  100 mg Oral Daily  . vitamin B-12  1,000 mcg Oral Daily  . Warfarin - Pharmacist Dosing Inpatient   Does not apply q1800    Infusions: . sodium chloride Stopped (11/23/18 1702)  .  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   Admitted with suspected  driveline infection.  Assessment/Plan:    1. LVAD Driveline infection.  - Wound CX- Few Staphylococcus Aureus  --> MSSA.  Transitioned to cefazolin per ID, will need prolonged course to heal.   - CT with small amount of inflammation and fluid along the driveline.  - Remains tender to palpation. Continue twice daily dressing to abdominal wound.  - Blood CX -NGTD. - PICC line placed 11/26/2018.   - Referred to [facility] for home antibiotics.  - No plan for OR debridement per Dr Maren Beach. - Finishing 5 day course of Diflucan for surrounding fungal rash Now complete - not ready for d/c until wound improves and is less painful   2. Chronic systolic CHF s/p HM-III VAD: Nonischemic cardiomyopathy, now s/p Heartmate 3 LVAD in 9/19.  - VAD interrogated personally. Parameters stable. - Volume status looks good. Not on lasix.Remains stable from HF perspective.  - Continue spironolactone25mg  daily.  -ContinueEntresto to 97/103 bid - Continue amlodipine 5 mg daily. - Continue ASA 81 daily.  - Continue warfarin for INR 2-2.5.  - INR 2.35. Discussed dosing with PharmD personally.   3. Smoking: Still smoking a few cigs/day. Using nicotine patches. Knows he needs to quit to be a transplant candidate.   4. RLE DVT: On coumadin.   5. OSA: Continue CPAP.No change.  6. Type II diabetes: Metformin. Continue sliding scale.   7. HTN - MAPs stable    I reviewed the LVAD parameters from today, and compared the results to the patient's prior recorded data.  No programming changes were made.  The LVAD is functioning within specified parameters.  The patient performs LVAD self-test daily.  LVAD interrogation was negative for any significant power changes, alarms or PI events/speed drops.  LVAD equipment check completed and is in good working order.  Back-up equipment present.   LVAD education done on emergency procedures and precautions and reviewed exit site care.  Length of Stay: 5  Arvilla Meres, MD 11/28/2018, 1:23 PM  VAD Team --- VAD ISSUES ONLY--- Pager [phone] (7am - 7am)  Advanced Heart Failure Team  Pager [phone] (M-F; 7a - 4p)  Please contact [facility] for night-coverage after hours (4p -7a ) and weekends on [email or web address]

## 2018-11-28 NOTE — Progress Notes (Addendum)
LVAD Coordinator Rounding Note:  Drive Line site: Existing VAD dressing removed and site care performed using sterile technique. Drive line exit site cleaned with betadine swab x 2, allowed to dry, and gauze dressing with silver strip re-applied. Exit site incorporated, but small opening around exit site, the velour is fully implanted at exit site. Small amount yellow/green drainage with no redness, tenderness, or foul odor noted; rash much improved. Skin prep and tape supplied in kit used. Drive line anchor re-applied. Daily dressing changes using daily kit with silver strip required.  Periumbilical counterincision site:  Existing dressing removed and site care performed using sterile technique. Site cleaned with betadine swab, allowed to dry and re-packed with aquacel silver strip, then covered with large aquacel and gauze dressing. Moderate amount yellow/brown drainage from site, no redness, tenderness, or foul odor noted.  Daily dressing changes required by VAD coordinator per Dr. Prescott Gum.          Emerson Monte RN [place] Coordinator  Office: [phone]  24/7 Pager: [phone]

## 2018-11-28 NOTE — Progress Notes (Signed)
ANTICOAGULATION CONSULT NOTE - Follow-Up Consult  Pharmacy Consult for heparin + warfarin Indication: LVAD  No Known Allergies  Patient Measurements: Height: 5\' 10"  (177.8 cm) Weight: 160 lb 11.5 oz (72.9 kg) IBW/kg (Calculated) : 73 Heparin Dosing Weight: 73kg  Vital Signs: Temp: 97.9 F (36.6 C) (01/11 0744) Temp Source: Oral (01/11 0744) BP: 108/66 (01/11 0744) Pulse Rate: 91 (01/11 0744)  Labs: Recent Labs    11/26/18 0220 11/27/18 0435 11/28/18 0533  HGB 10.2* 9.5* 9.7*  HCT 32.6* 31.2* 32.8*  PLT 349 337 341  LABPROT 20.9* 22.8* 25.4*  INR 1.83 2.05 2.35  HEPARINUNFRC 0.31 0.39  --   CREATININE 0.92 0.92 0.83    Estimated Creatinine Clearance: 98.8 mL/min (by C-G formula based on SCr of 0.83 mg/dL).   Medical History: Past Medical History:  Diagnosis Date  . Cardiomyopathy, unspecified (HCC)   . CHF (congestive heart failure) (HCC)   . Chronic back pain   . Enlarged heart   . Gastric ulcer   . Gastroenteritis   . H/O degenerative disc disease     Assessment: 81 yoM with LVAD admitted with driveline infection. INR was subtherapeutic on admit so heparin started and warfarin held for now in case procedures are needed involving wound.   Warfarin resumed 1/7, INR now therapeutic at 2.35. Heparin has been stopped and CBC is stable. Fluconazole course complete. Will restart home dose   *PTA Warfarin Dose = 7.5mg  Tues/Thurs/Sat/Sun, 5mg  Mon/Wed/Fri  Goal of Therapy:  INR 2-2.5 Monitor platelets by anticoagulation protocol: Yes   Plan:  -Warfarin 5mg  MWF / 7.5mg  TTSS -Daily INR, CBC, LDH  [name] Pharm.D. CPP, BCPS Clinical Pharmacist [phone] 11/28/2018 1:46 PM

## 2018-11-29 LAB — BASIC METABOLIC PANEL
Anion gap: 5 (ref 5–15)
BUN: 7 mg/dL (ref 6–20)
CO2: 29 mmol/L (ref 22–32)
Calcium: 8.8 mg/dL — ABNORMAL LOW (ref 8.9–10.3)
Chloride: 105 mmol/L (ref 98–111)
Creatinine, Ser: 1.08 mg/dL (ref 0.61–1.24)
GFR calc Af Amer: >60 mL/min (ref >60)
GFR calc non Af Amer: >60 mL/min (ref >60)
Glucose, Bld: 97 mg/dL (ref 70–99)
Potassium: 4 mmol/L (ref 3.5–5.1)
Sodium: 139 mmol/L (ref 135–145)

## 2018-11-29 LAB — CBC
HCT: 31.6 % — ABNORMAL LOW (ref 39.0–52.0)
Hemoglobin: 9.3 g/dL — ABNORMAL LOW (ref 13.0–17.0)
MCH: 24.2 pg — AB (ref 26.0–34.0)
MCHC: 29.4 g/dL — AB (ref 30.0–36.0)
MCV: 82.3 fL (ref 80.0–100.0)
Platelets: 305 10*3/uL (ref 150–400)
RBC: 3.84 MIL/uL — ABNORMAL LOW (ref 4.22–5.81)
RDW: 16.4 % — ABNORMAL HIGH (ref 11.5–15.5)
WBC: 7.4 10*3/uL (ref 4.0–10.5)
nRBC: 0 % (ref 0.0–0.2)

## 2018-11-29 LAB — GLUCOSE, CAPILLARY
Glucose-Capillary: 84 mg/dL (ref 70–99)
Glucose-Capillary: 155 mg/dL — ABNORMAL HIGH (ref 70–99)
Glucose-Capillary: 98 mg/dL (ref 70–99)
Glucose-Capillary: 105 mg/dL — ABNORMAL HIGH (ref 70–99)

## 2018-11-29 LAB — PROTIME-INR
INR: 2.58
Prothrombin Time: 27.3 seconds — ABNORMAL HIGH (ref 11.4–15.2)

## 2018-11-29 LAB — LACTATE DEHYDROGENASE: LDH: 149 U/L (ref 98–192)

## 2018-11-29 MED ORDER — WARFARIN SODIUM 5 MG PO TABS
5.0000 mg | ORAL_TABLET | Freq: Once | ORAL | Status: AC
  Administered 2018-11-29: 5 mg via ORAL
  Filled 2018-11-29: qty 1

## 2018-11-29 NOTE — Plan of Care (Signed)
  Problem: Health Behavior/Discharge Planning: Goal: Ability to manage health-related needs will improve Outcome: Progressing   Problem: Clinical Measurements: Goal: Will remain free from infection Outcome: Progressing Goal: Diagnostic test results will improve Outcome: Progressing   Problem: Pain Managment: Goal: General experience of comfort will improve Outcome: Progressing   Problem: Skin Integrity: Goal: Risk for impaired skin integrity will decrease Outcome: Progressing   Problem: Education: Goal: Knowledge of General Education information will improve Description Including pain rating scale, medication(s)/side effects and non-pharmacologic comfort measures Outcome: Adequate for Discharge   Problem: Clinical Measurements: Goal: Ability to maintain clinical measurements within normal limits will improve Outcome: Adequate for Discharge Goal: Respiratory complications will improve Outcome: Adequate for Discharge Goal: Cardiovascular complication will be avoided Outcome: Adequate for Discharge   Problem: Nutrition: Goal: Adequate nutrition will be maintained Outcome: Adequate for Discharge   Problem: Coping: Goal: Level of anxiety will decrease Outcome: Adequate for Discharge   Problem: Elimination: Goal: Will not experience complications related to bowel motility Outcome: Adequate for Discharge   Problem: Safety: Goal: Ability to remain free from injury will improve Outcome: Adequate for Discharge

## 2018-11-29 NOTE — Progress Notes (Signed)
Advanced Heart Failure VAD Team Note  PCP-Cardiologist: No primary care provider on file.   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 Initially on  vancomycin and cefepime. Antibiotics narrowed to cefazolin. Finished 5 day course of diflucan for associated fungal rash.   Ab pain and tenderness much improved. Able to change dressings without a problem. No fevers chills. Drainage much decreased. Walking halls.   Wound CX- Staph aureus -->MSSA Blood CX- NGTD.    LVAD INTERROGATION:  HeartMate III LVAD:   Flow 4.7 liters/min, speed 5700 , power 4.0  PI 4.3 VAD interrogated personally. Parameters stable.   Objective:    Vital Signs:   Temp:  [98 F (36.7 C)-98.6 F (37 C)] 98 F (36.7 C) (01/12 1129) Pulse Rate:  [78-90] 87 (01/12 1129) Resp:  [18-25] 24 (01/12 1129) BP: (85-96)/(62-79) 94/62 (01/12 1129) SpO2:  [98 %-100 %] 100 % (01/12 1129) Weight:  [75.3 kg] 75.3 kg (01/12 0437) Last BM Date: 11/26/18 Mean arterial Pressure 80s   Intake/Output:   Intake/Output Summary (Last 24 hours) at 11/29/2018 1512 Last data filed at 11/29/2018 0900 Gross per 24 hour  Intake 880 ml  Output 1 ml  Net 879 ml     Physical Exam    Physical Exam: General:  NAD.  HEENT: normal  Neck: supple. JVP not elevated.  Carotids 2+ bilat; no bruits. No lymphadenopathy or thryomegaly appreciated. Cor: LVAD hum.  Lungs: Clear. Abdomen: obese soft, nontender, non-distended. No hepatosplenomegaly. No bruits or masses. Good bowel sounds. Driveline site with bulk bandage. Nontender. Anchor in place.  Extremities: no cyanosis, clubbing, rash. Warm no edema  Neuro: alert & oriented x 3. No focal deficits. Moves all 4 without problem    Telemetry   NSR 80s Personally reviewed   EKG    N/A  Labs   Basic Metabolic Panel: Recent Labs  Lab 11/25/18 0818 11/26/18 0220 11/27/18 0435 11/28/18 0533 11/29/18 0447  NA 141 140 139 143 139    K 4.1 3.9 3.9 4.1 4.0  CL 111 109 106 107 105  CO2 23 24 27 29 29   GLUCOSE 103* 103* 84 80 97  BUN 5* 7 6 5* 7  CREATININE 0.87 0.92 0.92 0.83 1.08  CALCIUM 9.1 9.1 8.8* 9.1 8.8*    Liver Function Tests: No results for input(s): AST, ALT, ALKPHOS, BILITOT, PROT, ALBUMIN in the last 168 hours. No results for input(s): LIPASE, AMYLASE in the last 168 hours. No results for input(s): AMMONIA in the last 168 hours.  CBC: Recent Labs  Lab 11/25/18 0818 11/26/18 0220 11/27/18 0435 11/28/18 0533 11/29/18 0447  WBC 8.1 8.6 9.2 7.4 7.4  HGB 10.6* 10.2* 9.5* 9.7* 9.3*  HCT 34.8* 32.6* 31.2* 32.8* 31.6*  MCV 80.0 81.1 82.5 81.8 82.3  PLT 324 349 337 341 305    INR: Recent Labs  Lab 11/25/18 0818 11/26/18 0220 11/27/18 0435 11/28/18 0533 11/29/18 0447  INR 1.70 1.83 2.05 2.35 2.58    Other results:  EKG:    Imaging   No results found.   Medications:     Scheduled Medications: . amLODipine  5 mg Oral Daily  . aspirin EC  81 mg Oral Daily  . atorvastatin  20 mg Oral Daily  . carbamide peroxide  5 drop Both EARS TID  . DULoxetine  60 mg Oral Daily  . gabapentin  600 mg Oral TID  . insulin aspart  0-9 Units Subcutaneous  TID WC  . metFORMIN  500 mg Oral BID WC  . nystatin   Topical BID  . pantoprazole  40 mg Oral Daily  . sacubitril-valsartan  1 tablet Oral BID  . sodium chloride flush  10-40 mL Intracatheter Q12H  . spironolactone  25 mg Oral Daily  . thiamine  100 mg Oral Daily  . vitamin B-12  1,000 mcg Oral Daily  . warfarin  5 mg Oral ONCE-1800  . Warfarin - Pharmacist Dosing Inpatient   Does not apply q1800    Infusions: . sodium chloride Stopped (11/23/18 1702)  .  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  Assessment/Plan:    1. LVAD Driveline infection.  - Wound CX- Few Staphylococcus Aureus  --> MSSA.  Transitioned to cefazolin per ID, will need prolonged course to heal.  Completed 5 day course of fluconazole on 11/28/18 for surrounding fungal infection - CT with small amount of inflammation and fluid along the driveline.  - Pain and drainage much improved - Blood CX -NGTD. - PICC line placed 11/26/2018.   - [facility].  - No plan for OR debridement per Dr Maren BeachVantrigt. - VAD coordinators to reassess today. Hopefully home soon  2. Chronic systolic CHF s/p HM-III VAD: Nonischemic cardiomyopathy, now s/p Heartmate 3 LVAD in 9/19.  - VAD interrogated personally. Parameters stable. - Volume status looks good. Not on lasix.Remains stable from HF perspective.  - Continue spironolactone25mg  daily.  -ContinueEntresto to 97/103 bid - Continue amlodipine 5 mg daily. - Continue ASA 81 daily.  - Continue warfarin for INR 2-2.5.  - INR 2.58. Discussed dosing with PharmD personally.  3. Smoking: Still smoking a few cigs/day. Using nicotine patches. Knows he needs to quit to be a transplant candidate.   4. RLE DVT: On coumadin.   5. OSA: Continue CPAP.No change.  6. Type II diabetes: Metformin. Continue sliding scale.   7. HTN - MAPs stable in 80s. Continue current meds.    I reviewed the LVAD parameters from today, and compared the results to the patient's prior recorded data.  No programming changes were made.  The LVAD is functioning within specified parameters.  The patient performs LVAD self-test daily.  LVAD interrogation was negative for any significant power changes, alarms or PI events/speed drops.  LVAD equipment check completed and is in good working order.  Back-up  equipment present.   LVAD education done on emergency procedures and precautions and reviewed exit site care.  Length of Stay: 6  Arvilla Meresaniel Breslyn Abdo, MD 11/29/2018, 3:12 PM  VAD Team --- VAD ISSUES ONLY--- Pager [phone] (7am - 7am)  Advanced Heart Failure Team  Pager [phone] (M-F; 7a - 4p)  Please contact [facility] for night-coverage after hours (4p -7a ) and weekends on [email or web address]

## 2018-11-29 NOTE — Progress Notes (Signed)
ANTICOAGULATION CONSULT NOTE - Follow-Up Consult  Pharmacy Consult for heparin + warfarin Indication: LVAD  No Known Allergies  Patient Measurements: Height: 5\' 10"  (177.8 cm) Weight: 166 lb 0.1 oz (75.3 kg) IBW/kg (Calculated) : 73 Heparin Dosing Weight: 73kg  Vital Signs: Temp: 98 F (36.7 C) (01/12 1129) Temp Source: Oral (01/12 1129) BP: 94/62 (01/12 1129) Pulse Rate: 87 (01/12 1129)  Labs: Recent Labs    11/27/18 0435 11/28/18 0533 11/29/18 0447  HGB 9.5* 9.7* 9.3*  HCT 31.2* 32.8* 31.6*  PLT 337 341 305  LABPROT 22.8* 25.4* 27.3*  INR 2.05 2.35 2.58  HEPARINUNFRC 0.39  --   --   CREATININE 0.92 0.83 1.08    Estimated Creatinine Clearance: 76 mL/min (by C-G formula based on SCr of 1.08 mg/dL).   Medical History: Past Medical History:  Diagnosis Date  . Cardiomyopathy, unspecified (HCC)   . CHF (congestive heart failure) (HCC)   . Chronic back pain   . Enlarged heart   . Gastric ulcer   . Gastroenteritis   . H/O degenerative disc disease     Assessment: 51 yoM with LVAD admitted with driveline infection. INR was subtherapeutic on admit so heparin started and warfarin held for now in case procedures are needed involving wound.   Warfarin resumed 1/7, INR  therapeutic 2.5 but trending up. Heparin has been stopped and CBC is stable. Fluconazole course complete - can increase INR   *PTA Warfarin Dose = 7.5mg  Tues/Thurs/Sat/Sun, 5mg  Mon/Wed/Fri  Goal of Therapy:  INR 2-2.5 Monitor platelets by anticoagulation protocol: Yes   Plan:  -Warfarin 5mg  x1 today hope can restart home dose tomorrow  5mg  MWF / 7.5mg  TTSS -Daily INR, CBC, LDH  [name] Pharm.D. CPP, BCPS Clinical Pharmacist [phone] 11/29/2018 2:10 PM

## 2018-11-30 ENCOUNTER — Ambulatory Visit (HOSPITAL_COMMUNITY): Payer: Medicare Other

## 2018-11-30 DIAGNOSIS — I5022 Chronic systolic (congestive) heart failure: Secondary | ICD-10-CM

## 2018-11-30 LAB — GLUCOSE, CAPILLARY
Glucose-Capillary: 84 mg/dL (ref 70–99)
Glucose-Capillary: 103 mg/dL — ABNORMAL HIGH (ref 70–99)
Glucose-Capillary: 68 mg/dL — ABNORMAL LOW (ref 70–99)
Glucose-Capillary: 152 mg/dL — ABNORMAL HIGH (ref 70–99)
Glucose-Capillary: 124 mg/dL — ABNORMAL HIGH (ref 70–99)

## 2018-11-30 LAB — BASIC METABOLIC PANEL
Anion gap: 7 (ref 5–15)
BUN: 8 mg/dL (ref 6–20)
CO2: 29 mmol/L (ref 22–32)
Calcium: 9.1 mg/dL (ref 8.9–10.3)
Chloride: 106 mmol/L (ref 98–111)
Creatinine, Ser: 0.87 mg/dL (ref 0.61–1.24)
GFR calc Af Amer: >60 mL/min (ref >60)
GFR calc non Af Amer: >60 mL/min (ref >60)
Glucose, Bld: 85 mg/dL (ref 70–99)
POTASSIUM: 4.1 mmol/L (ref 3.5–5.1)
Sodium: 142 mmol/L (ref 135–145)

## 2018-11-30 LAB — CBC
HCT: 32.7 % — ABNORMAL LOW (ref 39.0–52.0)
Hemoglobin: 9.9 g/dL — ABNORMAL LOW (ref 13.0–17.0)
MCH: 24.9 pg — ABNORMAL LOW (ref 26.0–34.0)
MCHC: 30.3 g/dL (ref 30.0–36.0)
MCV: 82.4 fL (ref 80.0–100.0)
PLATELETS: 365 10*3/uL (ref 150–400)
RBC: 3.97 MIL/uL — AB (ref 4.22–5.81)
RDW: 16.2 % — ABNORMAL HIGH (ref 11.5–15.5)
WBC: 8 10*3/uL (ref 4.0–10.5)
nRBC: 0 % (ref 0.0–0.2)

## 2018-11-30 LAB — LACTATE DEHYDROGENASE: LDH: 162 U/L (ref 98–192)

## 2018-11-30 LAB — PROTIME-INR
INR: 2.49
Prothrombin Time: 26.6 seconds — ABNORMAL HIGH (ref 11.4–15.2)

## 2018-11-30 NOTE — Progress Notes (Addendum)
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 [name].   HM III LVAD implanted on 08/03/18 by Dr. Cyndia Bent under Destination Therapy criteria due to smoking status.  Pt laying in bed this morning. He just finished taking a bath. He says he feels good today and is looking forward to going home. He is requiring IV pain medication prior to dressing changes.  Abdominal area surrounding drive line is edematous and a hard pocket palpated. Hardness tracks along drive line.    Vital signs: Temp:  97.8 HR: 83 Doppler Pressure:  88 Automatic BP:  Not done O2 Sat: 98% on RA Wt:  157>160>159.6>162.0>167.6 lbs   LVAD interrogation reveals:  Speed: 5700 Flow: 4.8 Power:  4.4w PI: 3.2 Alarms: none Events:  5-10 daily Hematocrit: 32  Fixed speed: 5700 Low speed limit:  5400   Drive Line site: Existing VAD dressing removed and site care performed using sterile technique. Drive line exit site cleaned with betadine swab x 2, allowed to dry, and gauze dressing with silver strip re-applied. Exit site incorporated, but small opening around exit site, the velour is fully implanted at exit site. Small amount yellow/green drainage with no redness, tenderness, or foul odor noted; rash much improved, but site still "itching" per patient. Skin prep and tape supplied in kit used. Drive line anchor re-applied. Daily dressing changes using daily kit with silver strip required.  Periumbilical counterincision site:  Existing dressing removed and site care performed using sterile technique. Site cleaned with betadine swab x 2, allowed to dry and re-packed with aquacel silver strip (site tunnels approximately 10 cm), covered with large aquacel and gauze dressing. Moderate amount yellow/green drainage from site, no redness, tenderness, or foul odor noted.  A few new blisters noted on right abdomen. Daily dressing changes required by VAD coordinator per Dr. Prescott Gum.         Labs:  LDH trend: 165>127>128>120>140>162  INR trend: 1.56>1.61>1.70>1.83>2.05>2.49  Anticoagulation Plan: -INR Goal:  2.0 - 2.5  -ASA Dose:  81 mg daily - Coumadin re-started 11/24/18  Device: N/A - no ICD  Infection:  - BC's x 2 11/23/18>>NTD - LVAD Wound culture 11/24/18>>staphylococcus aureus   Plan/Recommendations:  1. Contact VAD Coordinator for any VAD equipment or driveline issues. 2. Daily drive line dressing changes;daily periumbilical counterincision site dressing changes. VAD coordinator to change dressings over the weekend.    Emerson Monte RN [place] Coordinator  Office: [phone]  24/7 Pager: [phone]

## 2018-11-30 NOTE — Progress Notes (Addendum)
Advanced Heart Failure VAD Team Note  PCP-Cardiologist: No primary care provider on file.   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 Initially on  vancomycin and cefepime. Antibiotics narrowed to cefazolin. Finished 5 day course of diflucan for associated fungal rash.   Feeling great this am. Anxious to go home, but wants it to be at the right time. Has been told Dr. PVT to assess wound today.   Wound CX- Staph aureus -->MSSA Blood CX- NGTD.    Hgb stable 9.9. WBC 8.0  LVAD Interrogation HM 3: Speed: 5700 Flow: 4.4 PI: 4.3 Power: 4.0. 4-5 PI events daily  Objective:    Vital Signs:   Temp:  [97.8 F (36.6 C)-98.1 F (36.7 C)] 97.8 F (36.6 C) (01/13 0800) Pulse Rate:  [80-104] 104 (01/12 2300) Resp:  [18-28] 28 (01/12 2300) BP: (81-104)/(57-87) 91/58 (01/13 0548) SpO2:  [98 %-100 %] 98 % (01/13 0548) Weight:  [76.2 kg] 76.2 kg (01/13 0548) Last BM Date: 11/26/18 Mean arterial Pressure 80s   Intake/Output:   Intake/Output Summary (Last 24 hours) at 11/30/2018 0846 Last data filed at 11/29/2018 0900 Gross per 24 hour  Intake 240 ml  Output 1 ml  Net 239 ml     Physical Exam    Physical Exam: General: NAD HEENT: Normal. Neck: Supple, JVP 6-7 cm. Carotids OK.  Cardiac:  Mechanical heart sounds with LVAD hum present.  Lungs:  CTAB, normal effort.  Abdomen:  NT, ND, no HSM. No bruits or masses. +BS  LVAD exit site: Well-healed and incorporated. Dressing dry and intact. No erythema or drainage. Stabilization device present and accurately applied. Driveline dressing changed daily per sterile technique. Extremities:  Warm and dry. No cyanosis, clubbing, or rash. No edema.  Neuro:  Alert & oriented x 3. Cranial nerves grossly intact. Moves all 4 extremities w/o difficulty. Affect pleasant     Telemetry   NSR 80-90s, personally reviewed.   EKG    No new tracings.    Labs   Basic Metabolic Panel: Recent Labs    Lab 11/26/18 0220 11/27/18 0435 11/28/18 0533 11/29/18 0447 11/30/18 0523  NA 140 139 143 139 142  K 3.9 3.9 4.1 4.0 4.1  CL 109 106 107 105 106  CO2 24 27 29 29 29   GLUCOSE 103* 84 80 97 85  BUN 7 6 5* 7 8  CREATININE 0.92 0.92 0.83 1.08 0.87  CALCIUM 9.1 8.8* 9.1 8.8* 9.1    Liver Function Tests: No results for input(s): AST, ALT, ALKPHOS, BILITOT, PROT, ALBUMIN in the last 168 hours. No results for input(s): LIPASE, AMYLASE in the last 168 hours. No results for input(s): AMMONIA in the last 168 hours.  CBC: Recent Labs  Lab 11/26/18 0220 11/27/18 0435 11/28/18 0533 11/29/18 0447 11/30/18 0523  WBC 8.6 9.2 7.4 7.4 8.0  HGB 10.2* 9.5* 9.7* 9.3* 9.9*  HCT 32.6* 31.2* 32.8* 31.6* 32.7*  MCV 81.1 82.5 81.8 82.3 82.4  PLT 349 337 341 305 365    INR: Recent Labs  Lab 11/26/18 0220 11/27/18 0435 11/28/18 0533 11/29/18 0447 11/30/18 0523  INR 1.83 2.05 2.35 2.58 2.49    Other results:  EKG:    Imaging   No results found.   Medications:     Scheduled Medications: . amLODipine  5 mg Oral Daily  . aspirin EC  81 mg Oral Daily  . atorvastatin  20 mg Oral Daily  . carbamide peroxide  5 drop Both EARS TID  . DULoxetine  60 mg Oral Daily  . gabapentin  600 mg Oral TID  . insulin aspart  0-9 Units Subcutaneous TID WC  . metFORMIN  500 mg Oral BID WC  . nystatin   Topical BID  . pantoprazole  40 mg Oral Daily  . sacubitril-valsartan  1 tablet Oral BID  . sodium chloride flush  10-40 mL Intracatheter Q12H  . spironolactone  25 mg Oral Daily  . thiamine  100 mg Oral Daily  . vitamin B-12  1,000 mcg Oral Daily  . Warfarin - Pharmacist Dosing Inpatient   Does not apply q1800    Infusions: . sodium chloride Stopped (11/23/18 1702)  .  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  Assessment/Plan:    1. LVAD Driveline infection.  - Wound CX- Few Staphylococcus Aureus  --> MSSA.  Transitioned to cefazolin per ID, will need prolonged course to heal.  Completed 5 day course of fluconazole on 11/28/18 for surrounding fungal infection - CT with small amount of inflammation and fluid along the driveline.  - Pain and drainage much improved.  - Blood CX -NGTD. - PICC line placed 11/26/2018.   - Referred to [facility] for home antibiotics.  - No plan for OR debridement per Dr Maren Beach. To reassess prior to discharge.  - VAD coordinators to reassess this am.   2. Chronic systolic CHF s/p HM-III VAD: Nonischemic cardiomyopathy, now s/p Heartmate 3 LVAD in 9/19.  - VAD interrogated personally. Parameters stable.   - Volume status stable off po lasix.   - Continue spironolactone25mg  daily.  -ContinueEntresto to 97/103 bid - Continue amlodipine 5 mg daily. - Continue ASA 81 daily.  - Continue warfarin for INR 2-2.5.  - INR 2.49. Dosing per PharmD.   3. Smoking: Still smoking a few cigs/day. Using nicotine patches. Knows he needs to quit to be a transplant candidate. Encouraged cessation.   4. RLE DVT: On coumadin. Denies bleeding.   5. OSA: Continue CPAP.No change.   6. Type II diabetes: Metformin. Continue sliding scale while inpatient.   7. HTN - MAPs stable. Continue current meds.   I reviewed the LVAD parameters from today, and compared the results to the patient's prior recorded data.  No programming changes were made.  The LVAD is functioning within specified parameters.  The patient performs LVAD self-test daily.  LVAD interrogation was negative for any significant power changes, alarms or PI events/speed drops.  LVAD  equipment check completed and is in good working order.  Back-up equipment present.   LVAD education done on emergency procedures and precautions and reviewed exit site care.   Length of Stay: [address]  [employer] 11/30/2018, 8:46 AM  VAD Team --- VAD ISSUES ONLY--- Pager [phone] (7am - 7am)  Advanced Heart Failure Team  Pager [phone] (M-F; 7a - 4p)  Please contact [facility] for night-coverage after hours (4p -7a ) and weekends on [email or web address]  Patient seen with PA, agree with the above note.   Site improving, less drainage and pain.  No fever.   - He will need cefazolin at least 24 days from 1/9.  Will need ID followup.  Has PICC, will need AHC.   LVAD parameters stable.  MAP controlled.   Possible home today after Dr Donata ClayVan Trigt evaluates site.   Marca AnconaDalton Rodriques Badie 11/30/2018 9:04 AM

## 2018-11-30 NOTE — Progress Notes (Signed)
Drive Line site: Existing VAD dressing removed and site care performed using sterile technique. Drive line exit site cleaned with betadine swab x 2, allowed to dry, and gauze dressing with silver strip re-applied. Exit site incorporated, but small opening around exit site, the velour is fully implanted at exit site. Small amount yellow/green drainage with no redness, tenderness, or foul odor noted; rash much improved, but site still "itching" per patient. Skin prep and tape supplied in kit used. Drive line anchor re-applied. Daily dressing changes using daily kit with silver strip required.  Periumbilical counterincision site:  Existing dressing removed and site care performed using sterile technique. Site cleaned with betadine swab, allowed to dry and re-packed with aquacel silver strip (site tunnels (@ 10 cm), covered with large aquacel and gauze dressing. Moderate amount yellow drainage from site, no redness, tenderness, or foul odor noted.  A few new blisters noted on right abdomen. Daily dressing changes required by VAD coordinator per Dr. Prescott Gum.  Zada Girt RN, VAD Coordinator 24/7 VAD pager: [phone]

## 2018-11-30 NOTE — Progress Notes (Signed)
Pharmacy Antibiotic Note  Theodore Welch is a 58 y.o. 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  Pharmacy initially consulted for vancomycin and cefepime dosing, now narrowed to cefazolin with wound culture growing MSSA. Blood cultures remain negative, pt afebrile, WBC normal. Continues to have drainage from driveline incision -plan OR for debridement 1/16. S/p Fluconazole x5 days cefazolin to continue through 2/3 per ID.  Plan: -Cefazolin 2g IV q8h -foow up new Cx data after debridement 1/16 -Monitor S/Sx infection, renal function  Height: 5\' 10"  (177.8 cm) Weight: 167 lb 15.9 oz (76.2 kg) IBW/kg (Calculated) : 73  Temp (24hrs), Avg:98 F (36.7 C), Min:97.8 F (36.6 C), Max:98.1 F (36.7 C)  Recent Labs  Lab 11/25/18 0818 11/26/18 0220 11/27/18 0435 11/28/18 0533 11/29/18 0447 11/30/18 0523  WBC 8.1 8.6 9.2 7.4 7.4 8.0  CREATININE 0.87 0.92 0.92 0.83 1.08 0.87  VANCOPEAK 20*  --   --   --   --   --     Estimated Creatinine Clearance: 94.4 mL/min (by C-G formula based on SCr of 0.87 mg/dL).    No Known Allergies  Antimicrobials this admission: Cefazolin 1/8 >> (2/3) Fluconazole 1/6 >> (1/11) Vancomycin 1/6 >>1/8 Cefepime 1/6 >>1/7  Dose adjustments this admission: none  Microbiology results: 1/6 BCx: NGTD 1/6 LVAD Wound Cx: MSSA  [name] Pharm.D. CPP, BCPS Clinical Pharmacist [phone] 11/30/2018 4:16 PM

## 2018-11-30 NOTE — Progress Notes (Signed)
ANTICOAGULATION CONSULT NOTE - Follow-Up Consult  Pharmacy Consult for heparin + warfarin Indication: LVAD  No Known Allergies  Patient Measurements: Height: 5\' 10"  (177.8 cm) Weight: 167 lb 15.9 oz (76.2 kg) IBW/kg (Calculated) : 73 Heparin Dosing Weight: 73kg  Vital Signs: Temp: 97.8 F (36.6 C) (01/13 0800) Temp Source: Oral (01/13 0800) BP: 101/86 (01/13 1400) Pulse Rate: 92 (01/13 1400)  Labs: Recent Labs    11/28/18 0533 11/29/18 0447 11/30/18 0523  HGB 9.7* 9.3* 9.9*  HCT 32.8* 31.6* 32.7*  PLT 341 305 365  LABPROT 25.4* 27.3* 26.6*  INR 2.35 2.58 2.49  CREATININE 0.83 1.08 0.87    Estimated Creatinine Clearance: 94.4 mL/min (by C-G formula based on SCr of 0.87 mg/dL).   Medical History: Past Medical History:  Diagnosis Date  . Cardiomyopathy, unspecified (HCC)   . CHF (congestive heart failure) (HCC)   . Chronic back pain   . Enlarged heart   . Gastric ulcer   . Gastroenteritis   . H/O degenerative disc disease     Assessment: 78 yoM with LVAD admitted with driveline infection. INR was subtherapeutic on admit so heparin started and warfarin held for now in case procedures are needed involving wound.   Warfarin resumed 1/7, INR  therapeutic 2.5  Heparin has been stopped and CBC is stable. Fluconazole course complete - can increase INR Warfarin now on hold for planned OR drive line debridement 7/71 - follow up need heparin bridge  *PTA Warfarin Dose = 7.5mg  Tues/Thurs/Sat/Sun, 5mg  Mon/Wed/Fri  Goal of Therapy:  INR 2-2.5 Monitor platelets by anticoagulation protocol: Yes   Plan:  Hold warfarin follow up restart post OR 1/16 follow up need for heparin bridge -Daily INR, CBC, LDH  [name] Pharm.D. CPP, BCPS Clinical Pharmacist [phone] 11/30/2018 4:05 PM

## 2018-11-30 NOTE — Plan of Care (Signed)

## 2018-11-30 NOTE — Progress Notes (Signed)
Procedure(s) (LRB): ABDOMINAL WOUND DEBRIDEMENT (N/A) APPLICATION OF WOUND VAC (N/A) Subjective: I have examined the patient's abdominal wound and spoken with the patient and wife. The abdominal wall infection has not responded to dressing changes and IV antibiotics. The patient will need surgical debridement in the OR and wound VAC placement later this week when the INR has normalized.  Plan surgery Thursday a.m. under general anesthesia.  Objective: Vital signs in last 24 hours: Temp:  [97.8 F (36.6 C)-98.1 F (36.7 C)] 97.8 F (36.6 C) (01/13 0800) Pulse Rate:  [80-104] 104 (01/12 2300) Cardiac Rhythm: Normal sinus rhythm (01/13 0811) Resp:  [18-31] 31 (01/13 1100) BP: (81-104)/(57-87) 91/58 (01/13 0548) SpO2:  [98 %] 98 % (01/13 0800) Weight:  [76.2 kg] 76.2 kg (01/13 0548)  Hemodynamic parameters for last 24 hours:    Intake/Output from previous day: 01/12 0701 - 01/13 0700 In: 240 [P.O.:240] Out: 1 [Stool:1] Intake/Output this shift: Total I/O In: 10 [I.V.:10] Out: -   Large area of indurated tissue in left midabdomen Normal VAD hum  lungs clear Neuro intact   Lab Results: Recent Labs    11/29/18 0447 11/30/18 0523  WBC 7.4 8.0  HGB 9.3* 9.9*  HCT 31.6* 32.7*  PLT 305 365   BMET:  Recent Labs    11/29/18 0447 11/30/18 0523  NA 139 142  K 4.0 4.1  CL 105 106  CO2 29 29  GLUCOSE 97 85  BUN 7 8  CREATININE 1.08 0.87  CALCIUM 8.8* 9.1    PT/INR:  Recent Labs    11/30/18 0523  LABPROT 26.6*  INR 2.49   ABG    Component Value Date/Time   PHART 7.473 (H) 08/05/2018 0345   HCO3 26.9 08/05/2018 0345   TCO2 26 08/04/2018 1622   O2SAT 73.6 08/12/2018 0345   CBG (last 3)  Recent Labs    11/29/18 2126 11/30/18 0744 11/30/18 1225  GLUCAP 105* 84 103*    Assessment/Plan: S/P Procedure(s) (LRB): ABDOMINAL WOUND DEBRIDEMENT (N/A) APPLICATION OF WOUND VAC (N/A) Mid abdominal wound infection with MSSA in the patient's VAD power cord  tunnel No evidence of involvement of the pump pocket Plan wound debridement and wound VAC placement in OR on January 16   LOS: 7 days    Theodore Welch 11/30/2018

## 2018-12-01 LAB — BASIC METABOLIC PANEL
Anion gap: 4 — ABNORMAL LOW (ref 5–15)
BUN: 7 mg/dL (ref 6–20)
CO2: 31 mmol/L (ref 22–32)
Calcium: 9 mg/dL (ref 8.9–10.3)
Chloride: 107 mmol/L (ref 98–111)
Creatinine, Ser: 0.85 mg/dL (ref 0.61–1.24)
GFR calc Af Amer: >60 mL/min (ref >60)
GFR calc non Af Amer: >60 mL/min (ref >60)
Glucose, Bld: 90 mg/dL (ref 70–99)
POTASSIUM: 4 mmol/L (ref 3.5–5.1)
Sodium: 142 mmol/L (ref 135–145)

## 2018-12-01 LAB — GLUCOSE, CAPILLARY
Glucose-Capillary: 91 mg/dL (ref 70–99)
Glucose-Capillary: 77 mg/dL (ref 70–99)
Glucose-Capillary: 174 mg/dL — ABNORMAL HIGH (ref 70–99)
Glucose-Capillary: 108 mg/dL — ABNORMAL HIGH (ref 70–99)
Glucose-Capillary: 104 mg/dL — ABNORMAL HIGH (ref 70–99)

## 2018-12-01 LAB — PROTIME-INR
INR: 2.48
Prothrombin Time: 26.5 seconds — ABNORMAL HIGH (ref 11.4–15.2)

## 2018-12-01 LAB — CBC
HEMATOCRIT: 32.7 % — AB (ref 39.0–52.0)
Hemoglobin: 9.9 g/dL — ABNORMAL LOW (ref 13.0–17.0)
MCH: 25.1 pg — ABNORMAL LOW (ref 26.0–34.0)
MCHC: 30.3 g/dL (ref 30.0–36.0)
MCV: 82.8 fL (ref 80.0–100.0)
Platelets: 354 10*3/uL (ref 150–400)
RBC: 3.95 MIL/uL — ABNORMAL LOW (ref 4.22–5.81)
RDW: 16.1 % — ABNORMAL HIGH (ref 11.5–15.5)
WBC: 8 10*3/uL (ref 4.0–10.5)
nRBC: 0 % (ref 0.0–0.2)

## 2018-12-01 LAB — LACTATE DEHYDROGENASE: LDH: 172 U/L (ref 98–192)

## 2018-12-01 NOTE — Progress Notes (Addendum)
Advanced Heart Failure VAD Team Note  PCP-Cardiologist: No primary care provider on file.   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 Initially on  vancomycin and cefepime. Antibiotics narrowed to cefazolin. Finished 5 day course of diflucan for associated fungal rash.   Dr. Donata Clay assessed 11/30/2018. To OR 12/03/2018.  Feeling OK today. OK with needing to go to OR, would rather it be fully taken care of   Wound CX- Staph aureus -->MSSA Blood CX- NGTD.    Hgb stable 9.9. WBC 8.0.  INR 2.48. Holding for OR.   LVAD Interrogation HM 3: Speed: 5700 Flow: 4.9 PI: 2.8 Power: 4.0. 10-15 PI events   Objective:    Vital Signs:   Temp:  [97.8 F (36.6 C)] 97.8 F (36.6 C) (01/14 0002) Pulse Rate:  [92] 92 (01/13 1400) Resp:  [19-33] 20 (01/14 0417) BP: (80-104)/(64-88) 85/64 (01/14 0417) SpO2:  [98 %] 98 % (01/13 1400) Weight:  [73.3 kg] 73.3 kg (01/14 0418) Last BM Date: 11/30/18 Mean arterial Pressure 70-80s  Intake/Output:   Intake/Output Summary (Last 24 hours) at 12/01/2018 0744 Last data filed at 12/01/2018 0000 Gross per 24 hour  Intake 520 ml  Output -  Net 520 ml     Physical Exam   General: Well appearing this am. NAD.  HEENT: Normal. Neck: Supple, JVP 7-8 cm. Carotids OK.  Cardiac:  Mechanical heart sounds with LVAD hum present.  Lungs:  CTAB, normal effort.  Abdomen:  NT, ND, no HSM. No bruits or masses. +BS  LVAD exit site: Bulky dressing in place. Stabilization device present and accurately applied. Driveline dressing changed daily per sterile technique. Extremities:  Warm and dry. No cyanosis, clubbing, rash, or edema.  Neuro:  Alert & oriented x 3. Cranial nerves grossly intact. Moves all 4 extremities w/o difficulty. Affect pleasant     Telemetry   NSR 80-90s, personally reviewed.   EKG    No new tracings.    Labs   Basic Metabolic Panel: Recent Labs  Lab 11/27/18 0435 11/28/18 0533  11/29/18 0447 11/30/18 0523 12/01/18 0424  NA 139 143 139 142 142  K 3.9 4.1 4.0 4.1 4.0  CL 106 107 105 106 107  CO2 27 29 29 29 31   GLUCOSE 84 80 97 85 90  BUN 6 5* 7 8 7   CREATININE 0.92 0.83 1.08 0.87 0.85  CALCIUM 8.8* 9.1 8.8* 9.1 9.0    Liver Function Tests: No results for input(s): AST, ALT, ALKPHOS, BILITOT, PROT, ALBUMIN in the last 168 hours. No results for input(s): LIPASE, AMYLASE in the last 168 hours. No results for input(s): AMMONIA in the last 168 hours.  CBC: Recent Labs  Lab 11/27/18 0435 11/28/18 0533 11/29/18 0447 11/30/18 0523 12/01/18 0424  WBC 9.2 7.4 7.4 8.0 8.0  HGB 9.5* 9.7* 9.3* 9.9* 9.9*  HCT 31.2* 32.8* 31.6* 32.7* 32.7*  MCV 82.5 81.8 82.3 82.4 82.8  PLT 337 341 305 365 354    INR: Recent Labs  Lab 11/27/18 0435 11/28/18 0533 11/29/18 0447 11/30/18 0523 12/01/18 0424  INR 2.05 2.35 2.58 2.49 2.48    Other results:  EKG:    Imaging   No results found.   Medications:     Scheduled Medications: . amLODipine  5 mg Oral Daily  . aspirin EC  81 mg Oral Daily  . atorvastatin  20 mg Oral Daily  . carbamide peroxide  5 drop Both EARS  TID  . DULoxetine  60 mg Oral Daily  . gabapentin  600 mg Oral TID  . insulin aspart  0-9 Units Subcutaneous TID WC  . metFORMIN  500 mg Oral BID WC  . nystatin   Topical BID  . pantoprazole  40 mg Oral Daily  . sacubitril-valsartan  1 tablet Oral BID  . sodium chloride flush  10-40 mL Intracatheter Q12H  . spironolactone  25 mg Oral Daily  . thiamine  100 mg Oral Daily  . vitamin B-12  1,000 mcg Oral Daily  . Warfarin - Pharmacist Dosing Inpatient   Does not apply q1800    Infusions: . sodium chloride Stopped (11/23/18 1702)  .  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  Assessment/Plan:    1. LVAD Driveline infection.  - Wound CX- Few Staphylococcus Aureus  --> MSSA.  Transitioned to cefazolin per ID, will need prolonged course to heal.  Completed 5 day course of fluconazole on 11/28/18 for surrounding fungal infection - CT with small amount of inflammation and fluid along the driveline.  - Pain and drainage much improved.  - Blood CX -NGTD. - PICC line placed 11/26/2018.   - Referred to [facility] for home antibiotics. Will continue for 24 days total from 11/26/2018. - Plan for OR debridement per Dr Maren Beach 12/03/2018  - VAD coordinators following.   2. Chronic systolic CHF s/p HM-III VAD: Nonischemic cardiomyopathy, now s/p Heartmate 3 LVAD in 9/19.  - VAD interrogated personally. Parameters stable.   - Volume status stable off po lasix.  - Continue spironolactone25mg  daily.  -ContinueEntresto to 97/103 bid - Continue amlodipine 5 mg daily. - Continue ASA 81 daily.  - Continue warfarin for INR 2-2.5.  - INR 2.48. Dosing per PharmD.   3. Smoking:  - Still smoking a few cigs/day as outpatient. Using nicotine patches. Knows he needs to quit to be a transplant candidate. Encouraged cessation.   4. RLE DVT: On coumadin. Denies bleeding.  - Coumadin now on hold for OR.   5. OSA:  - Continue CPAP.No change.    6. Type II diabetes: Metformin.  - Continue sliding scale while inpatient. No change.   7. HTN - MAPs stable. Continue current meds.   I reviewed the LVAD parameters from today, and compared the results to the patient's prior recorded data.  No programming changes were made.  The LVAD is functioning within specified parameters.  The patient performs LVAD self-test daily.  LVAD interrogation was negative for any significant power changes,  alarms or PI events/speed drops.  LVAD equipment check completed and is in good working order.  Back-up equipment present.   LVAD education done on emergency procedures and precautions and reviewed exit site care.   Length of Stay: [address]  [employer] 12/01/2018, 7:44 AM  VAD Team --- VAD ISSUES ONLY--- Pager [phone] (7am - 7am)  Advanced Heart Failure Team  Pager [phone] (M-F; 7a - 4p)  Please contact [facility] for night-coverage after hours (4p -7a ) and weekends on [email or web address]  Patient seen with PA, agree with the above note.  Mild discomfort at wound site.   Due to lack of response to dressing changes  and IV antibiotics, patient will need debridement in OR on Thursday with wound vac.  Warfarin held, start heparin gtt with INR < 1.8.   LVAD parameters stable, MAP is controlled.    Marca AnconaDalton  12/01/2018 8:12 AM

## 2018-12-01 NOTE — Progress Notes (Addendum)
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 [name].   HM III LVAD implanted on 08/03/18 by Dr. Cyndia Bent under Destination Therapy criteria due to smoking status.  Pt sitting up in bed this morning after returning from a walk around the unit. He is in good spirits today, saying "I have accepted that I have to go to the OR Thursday, I just want everything to be completed correctly so I can go home and stay home."   Plan is OR onThursday 12/03/2018 for wound debridement with Dr. Prescott Gum.    Vital signs: Temp:  98.1 HR: 84 Doppler Pressure:  86 Automatic BP:  102/70 (81) O2 Sat: 98% on RA Wt:  157>160>159.6>162.0>167.6>161.2 lbs   LVAD interrogation reveals:  Speed: 5700 Flow: 4.9 Power:  4.4w PI: 2.9 Alarms: none Events:  5-10 daily Hematocrit: 32  Fixed speed: 5700 Low speed limit:  5400   Drive Line site: He is requiring IV pain medication prior to dressing changes; premedicated by bedside RN with IV pain medication. Existing VAD dressing removed and site care performed using sterile technique. Drive line exit site cleaned with betadine swab x 2, allowed to dry, and gauze dressing with silver strip re-applied. Exit site incorporated, but small opening around exit site, the velour is fully implanted at exit site. Small amount yellow/green drainage with no redness, tenderness, or foul odor noted; rash much improved, but site still "itching" per patient. Skin prep and tape supplied in kit used. Abdominal area surrounding drive line is edematous and a hard pocket palpated. Hardness tracks along drive line. Drive line anchor re-applied. Daily dressing changes using daily kit with silver strip required.  Periumbilical counterincision site:  Existing dressing removed and site care performed using sterile technique. Site cleaned with betadine swab x 2, allowed to dry and re-packed with aquacel silver strip (site tunnels approximately 10 cm),  covered with large aquacel and gauze dressing. Moderate amount yellow/green drainage from site, no redness, tenderness, or foul odor noted. A few new blisters noted on right abdomen. Daily dressing changes required by VAD coordinator per Dr. Prescott Gum.          Labs:  LDH trend: 165>127>128>120>140>162>172  INR trend: 1.56>1.61>1.70>1.83>2.05>2.49>2.48  Anticoagulation Plan: -INR Goal:  2.0 - 2.5  -ASA Dose:  81 mg daily - Coumadin re-started 11/24/18  Device: N/A - no ICD  Infection:  - BC's x 2 11/23/18>>NTD - LVAD Wound culture 11/24/18>>staphylococcus aureus   Plan/Recommendations:  1. Contact VAD Coordinator for any VAD equipment or driveline issues. 2. Daily drive line dressing changes;daily periumbilical counterincision site dressing changes. VAD coordinator to change dressings over the weekend.  3. VAD coordinator to accompany patient to OR on Thursday.    Emerson Monte RN [place] Coordinator  Office: [phone]  24/7 Pager: [phone]

## 2018-12-01 NOTE — Progress Notes (Signed)
ANTICOAGULATION CONSULT NOTE - Follow-Up Consult  Pharmacy Consult for heparin + warfarin Indication: LVAD  No Known Allergies  Patient Measurements: Height: 5\' 10"  (177.8 cm) Weight: 161 lb 9.6 oz (73.3 kg) IBW/kg (Calculated) : 73 Heparin Dosing Weight: 73kg  Vital Signs: Temp: 98.1 F (36.7 C) (01/14 0800) Temp Source: Oral (01/14 0800) BP: 102/70 (01/14 0800)  Labs: Recent Labs    11/29/18 0447 11/30/18 0523 12/01/18 0424  HGB 9.3* 9.9* 9.9*  HCT 31.6* 32.7* 32.7*  PLT 305 365 354  LABPROT 27.3* 26.6* 26.5*  INR 2.58 2.49 2.48  CREATININE 1.08 0.87 0.85    Estimated Creatinine Clearance: 96.6 mL/min (by C-G formula based on SCr of 0.85 mg/dL).   Medical History: Past Medical History:  Diagnosis Date  . Cardiomyopathy, unspecified (HCC)   . CHF (congestive heart failure) (HCC)   . Chronic back pain   . Enlarged heart   . Gastric ulcer   . Gastroenteritis   . H/O degenerative disc disease     Assessment: 45 yoM with LVAD admitted with driveline infection. INR was subtherapeutic on admit so heparin started and warfarin held for now in case procedures are needed involving wound.   Warfarin resumed 1/7, INR is therapeutic, however will now hold warfarin for OR on 1/16. Will start heparin once INR < 1.8. LDH and H/H stable.  *PTA Warfarin Dose = 7.5mg  Tues/Thurs/Sat/Sun, 5mg  Mon/Wed/Fri  Goal of Therapy:  INR 2-2.5 Monitor platelets by anticoagulation protocol: Yes   Plan:  -Hold warfarin tonight -Follow INR  Fredonia Highland, PharmD, BCPS Clinical Pharmacist [phone] Please check AMION for all [facility] Pharmacy numbers 12/01/2018

## 2018-12-01 NOTE — Progress Notes (Signed)
Inpatient Diabetes Program Recommendations  AACE/ADA: New Consensus Statement on Inpatient Glycemic Control (2015)  Target Ranges:  Prepandial:   less than 140 mg/dL      Peak postprandial:   less than 180 mg/dL (1-2 hours)      Critically ill patients:  140 - 180 mg/dL   Lab Results  Component Value Date   GLUCAP 124 (H) 11/30/2018   HGBA1C 6.2 (H) 08/03/2018    Review of Glycemic Control Results for Theodore Welch, Theodore Welch (MRN [id number]) as of 12/01/2018 07:58  Ref. Range 11/30/2018 07:44 11/30/2018 12:25 11/30/2018 17:12 11/30/2018 18:18 11/30/2018 20:33  Glucose-Capillary Latest Ref Range: 70 - 99 mg/dL 84 845 (H) 68 (L) 364 (H) 124 (H)   Diabetes history: DM2 Outpatient Diabetes medications: Metformin 500 mg bid Current orders for Inpatient glycemic control: Metformin 500 mg bid + Novolog sensitive tid  Inpatient Diabetes Program Recommendations:   While in the hospital: -D/C Metformin  Thank you, Keelin Fischer. Hale Chalfin, RN, MSN, CDE  Diabetes Coordinator Inpatient Glycemic Control Team Team Pager [phone] (8am-5pm) 12/01/2018 8:03 AM

## 2018-12-01 NOTE — Plan of Care (Signed)
  Problem: Education: Goal: Knowledge of General Education information will improve Description Including pain rating scale, medication(s)/side effects and non-pharmacologic comfort measures Outcome: Progressing   Problem: Health Behavior/Discharge Planning: Goal: Ability to manage health-related needs will improve Outcome: Progressing   Problem: Clinical Measurements: Goal: Ability to maintain clinical measurements within normal limits will improve Outcome: Progressing Goal: Will remain free from infection Outcome: Progressing Goal: Diagnostic test results will improve Outcome: Progressing Goal: Cardiovascular complication will be avoided Outcome: Progressing   Problem: Nutrition: Goal: Adequate nutrition will be maintained Outcome: Progressing   Problem: Coping: Goal: Level of anxiety will decrease Outcome: Progressing   Problem: Pain Managment: Goal: General experience of comfort will improve Outcome: Progressing   Problem: Safety: Goal: Ability to remain free from injury will improve Outcome: Progressing   Problem: Skin Integrity: Goal: Risk for impaired skin integrity will decrease Outcome: Progressing   

## 2018-12-01 NOTE — Care Management Important Message (Signed)
Important Message  Patient Details  Name: Theodore Welch MRN: [id number] Date of Birth: 1961/05/23   Medicare Important Message Given:  Yes    Oralia Rud Nikole Swartzentruber 12/01/2018, 4:37 PM

## 2018-12-02 ENCOUNTER — Inpatient Hospital Stay (HOSPITAL_COMMUNITY): Admission: RE | Admit: 2018-12-02 | Payer: Medicare Other | Source: Ambulatory Visit

## 2018-12-02 ENCOUNTER — Ambulatory Visit (HOSPITAL_COMMUNITY): Payer: Medicare Other

## 2018-12-02 LAB — BASIC METABOLIC PANEL
Anion gap: 6 (ref 5–15)
BUN: 8 mg/dL (ref 6–20)
CO2: 28 mmol/L (ref 22–32)
Calcium: 9 mg/dL (ref 8.9–10.3)
Chloride: 108 mmol/L (ref 98–111)
Creatinine, Ser: 0.83 mg/dL (ref 0.61–1.24)
GFR calc Af Amer: >60 mL/min (ref >60)
GFR calc non Af Amer: >60 mL/min (ref >60)
Glucose, Bld: 112 mg/dL — ABNORMAL HIGH (ref 70–99)
Potassium: 3.8 mmol/L (ref 3.5–5.1)
Sodium: 142 mmol/L (ref 135–145)

## 2018-12-02 LAB — LACTATE DEHYDROGENASE: LDH: 180 U/L (ref 98–192)

## 2018-12-02 LAB — TYPE AND SCREEN
ABO/RH(D): O POS
Antibody Screen: NEGATIVE

## 2018-12-02 LAB — CBC
HCT: 32.3 % — ABNORMAL LOW (ref 39.0–52.0)
Hemoglobin: 9.7 g/dL — ABNORMAL LOW (ref 13.0–17.0)
MCH: 24.6 pg — ABNORMAL LOW (ref 26.0–34.0)
MCHC: 30 g/dL (ref 30.0–36.0)
MCV: 82 fL (ref 80.0–100.0)
NRBC: 0 % (ref 0.0–0.2)
Platelets: 369 10*3/uL (ref 150–400)
RBC: 3.94 MIL/uL — ABNORMAL LOW (ref 4.22–5.81)
RDW: 16 % — ABNORMAL HIGH (ref 11.5–15.5)
WBC: 7.8 10*3/uL (ref 4.0–10.5)

## 2018-12-02 LAB — GLUCOSE, CAPILLARY
GLUCOSE-CAPILLARY: 99 mg/dL (ref 70–99)
Glucose-Capillary: 98 mg/dL (ref 70–99)
Glucose-Capillary: 95 mg/dL (ref 70–99)
Glucose-Capillary: 203 mg/dL — ABNORMAL HIGH (ref 70–99)

## 2018-12-02 LAB — PROTIME-INR
INR: 1.87
Prothrombin Time: 21.3 seconds — ABNORMAL HIGH (ref 11.4–15.2)

## 2018-12-02 MED ORDER — HEPARIN (PORCINE) 25000 UT/250ML-% IV SOLN
1300.0000 [IU]/h | INTRAVENOUS | Status: DC
  Administered 2018-12-02: 1300 [IU]/h via INTRAVENOUS
  Filled 2018-12-02: qty 250

## 2018-12-02 NOTE — Plan of Care (Signed)
  Problem: Education: Goal: Knowledge of General Education information will improve Description Including pain rating scale, medication(s)/side effects and non-pharmacologic comfort measures Outcome: Progressing   Problem: Health Behavior/Discharge Planning: Goal: Ability to manage health-related needs will improve Outcome: Progressing   Problem: Clinical Measurements: Goal: Ability to maintain clinical measurements within normal limits will improve Outcome: Progressing Goal: Will remain free from infection Outcome: Progressing Goal: Diagnostic test results will improve Outcome: Progressing Goal: Cardiovascular complication will be avoided Outcome: Progressing   Problem: Nutrition: Goal: Adequate nutrition will be maintained Outcome: Progressing   Problem: Coping: Goal: Level of anxiety will decrease Outcome: Progressing   Problem: Pain Managment: Goal: General experience of comfort will improve Outcome: Progressing   Problem: Safety: Goal: Ability to remain free from injury will improve Outcome: Progressing   Problem: Skin Integrity: Goal: Risk for impaired skin integrity will decrease Outcome: Progressing

## 2018-12-02 NOTE — Progress Notes (Signed)
Pharmacy Antibiotic Note  Theodore Welch is a 58 y.o. 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  Pharmacy initially consulted for vancomycin and cefepime dosing, now narrowed to cefazolin with wound culture growing MSSA. Blood cultures remain negative, pt afebrile, WBC normal. Continues to have drainage from driveline incision, debridement in OR planned for tomorrow.  Plan: -Cefazolin 2g IV q8h through 2/3 per ID -Follow-up new Cx data after debridement 1/16 -Monitor S/Sx infection, renal function  Height: 5\' 10"  (177.8 cm) Weight: 168 lb (76.2 kg) IBW/kg (Calculated) : 73  Temp (24hrs), Avg:98.1 F (36.7 C), Min:97.6 F (36.4 C), Max:98.5 F (36.9 C)  Recent Labs  Lab 11/28/18 0533 11/29/18 0447 11/30/18 0523 12/01/18 0424 12/02/18 0505  WBC 7.4 7.4 8.0 8.0 7.8  CREATININE 0.83 1.08 0.87 0.85 0.83    Estimated Creatinine Clearance: 98.9 mL/min (by C-G formula based on SCr of 0.83 mg/dL).    No Known Allergies  Antimicrobials this admission: Cefazolin 1/8 >> (2/3) Fluconazole 1/6 >> (1/11) Vancomycin 1/6 >>1/8 Cefepime 1/6 >>1/7  Dose adjustments this admission: none  Microbiology results: 1/6 BCx: NGTD 1/6 LVAD Wound Cx: MSSA  Fredonia Highland, PharmD, BCPS Clinical Pharmacist [phone] Please check AMION for all [facility] Pharmacy numbers 12/02/2018

## 2018-12-02 NOTE — Plan of Care (Signed)

## 2018-12-02 NOTE — Progress Notes (Addendum)
Advanced Heart Failure VAD Team Note  PCP-Cardiologist: No primary care provider on file.   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 Initially on  vancomycin and cefepime. Antibiotics narrowed to cefazolin. Finished 5 day course of diflucan for associated fungal rash.   Dr. Donata Clay assessed 11/30/2018. To OR 12/03/2018.  Feeling good this am. No drainage around bandage. No lightheadedness or dizziness. No SOB or pain.   Wound CX- Staph aureus -->MSSA Blood CX- NGTD.    Hgb stable 9.7. WBC 7.8. INR 1.87. Holding coumadin for OR. Will need heparin today.   LVAD Interrogation HM 3: Speed: 5700 Flow: 5.0 PI: 2.3 Power: 4.0. Walking halls, so PI events not noted. VAD interrogated personally. Parameters stable.     Objective:    Vital Signs:   Temp:  [97.6 F (36.4 C)-98.5 F (36.9 C)] 98.5 F (36.9 C) (01/15 0823) Pulse Rate:  [53-104] 104 (01/15 0823) Resp:  [13-25] 16 (01/15 0823) BP: (75-109)/(45-85) 109/85 (01/15 0823) SpO2:  [98 %-99 %] 99 % (01/15 0300) Weight:  [76.2 kg-76.8 kg] 76.2 kg (01/15 0300) Last BM Date: 12/01/18   Mean arterial Pressure 70-80s  Intake/Output:   Intake/Output Summary (Last 24 hours) at 12/02/2018 0849 Last data filed at 12/01/2018 2256 Gross per 24 hour  Intake 690 ml  Output -  Net 690 ml     Physical Exam   General: NAD HEENT: Normal. Neck: Supple, JVP 7-8 cm. Carotids OK.  Cardiac:  Mechanical heart sounds with LVAD hum present.  Lungs:  CTAB, normal effort.  Abdomen:  NT, ND, no HSM. No bruits or masses. +BS  LVAD exit site: Well-healed and incorporated. Dressing dry and intact. No erythema or drainage. Stabilization device present and accurately applied. Driveline dressing changed daily per sterile technique. Extremities:  Warm and dry. No cyanosis, clubbing, rash, or edema.  Neuro:  Alert & oriented x 3. Cranial nerves grossly intact. Moves all 4 extremities w/o difficulty.  Affect pleasant     Telemetry   NSR 90s, personally reviewed.   EKG    No new tracings.    Labs   Basic Metabolic Panel: Recent Labs  Lab 11/28/18 0533 11/29/18 0447 11/30/18 0523 12/01/18 0424 12/02/18 0505  NA 143 139 142 142 142  K 4.1 4.0 4.1 4.0 3.8  CL 107 105 106 107 108  CO2 29 29 29 31 28   GLUCOSE 80 97 85 90 112*  BUN 5* 7 8 7 8   CREATININE 0.83 1.08 0.87 0.85 0.83  CALCIUM 9.1 8.8* 9.1 9.0 9.0    Liver Function Tests: No results for input(s): AST, ALT, ALKPHOS, BILITOT, PROT, ALBUMIN in the last 168 hours. No results for input(s): LIPASE, AMYLASE in the last 168 hours. No results for input(s): AMMONIA in the last 168 hours.  CBC: Recent Labs  Lab 11/28/18 0533 11/29/18 0447 11/30/18 0523 12/01/18 0424 12/02/18 0505  WBC 7.4 7.4 8.0 8.0 7.8  HGB 9.7* 9.3* 9.9* 9.9* 9.7*  HCT 32.8* 31.6* 32.7* 32.7* 32.3*  MCV 81.8 82.3 82.4 82.8 82.0  PLT 341 305 365 354 369    INR: Recent Labs  Lab 11/28/18 0533 11/29/18 0447 11/30/18 0523 12/01/18 0424 12/02/18 0505  INR 2.35 2.58 2.49 2.48 1.87    Other results:  EKG:    Imaging   No results found.   Medications:     Scheduled Medications: . amLODipine  5 mg Oral Daily  . aspirin EC  81 mg Oral Daily  . atorvastatin  20 mg Oral Daily  . carbamide peroxide  5 drop Both EARS TID  . DULoxetine  60 mg Oral Daily  . gabapentin  600 mg Oral TID  . insulin aspart  0-9 Units Subcutaneous TID WC  . metFORMIN  500 mg Oral BID WC  . nystatin   Topical BID  . pantoprazole  40 mg Oral Daily  . sacubitril-valsartan  1 tablet Oral BID  . sodium chloride flush  10-40 mL Intracatheter Q12H  . spironolactone  25 mg Oral Daily  . thiamine  100 mg Oral Daily  . vitamin B-12  1,000 mcg Oral Daily  . Warfarin - Pharmacist Dosing Inpatient   Does not apply q1800    Infusions: . sodium chloride Stopped (11/23/18 1702)  .  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  Assessment/Plan:    1. LVAD Driveline infection.  - Wound CX- Few Staphylococcus Aureus  --> MSSA.  Transitioned to cefazolin per ID, will need prolonged course to heal.  Completed 5 day course of fluconazole on 11/28/18 for surrounding fungal infection - CT with small amount of inflammation and fluid along the driveline.  - Pain and drainage much improved.  - Blood CX -NGTD. - PICC line placed 11/26/2018.   - Referred to [facility] for home antibiotics. Will continue for 24 days total from 11/26/2018. - Plan for OR debridement per Dr Maren Beach 12/03/2018.  - INR 1.87. Will need heparin bridge. Discussed timing with Pharm-D and Dr. Maren Beach.   2. Chronic systolic CHF s/p HM-III VAD: Nonischemic cardiomyopathy, now s/p Heartmate 3 LVAD in 9/19.  - VAD interrogated personally. Parameters stable.   - Volume status stable off po lasix.  - Continue spironolactone25mg  daily.  -ContinueEntresto 97/103 mg BID.  - Continue amlodipine 5 mg daily. - Continue ASA 81 daily.  - Continue warfarin for INR 2-2.5.  - INR 1.87. Dosing per PharmD.   3. Smoking:  - Still smoking a few cigs/day as outpatient. Using nicotine patches. Knows he needs to quit to be a transplant candidate. Encouraged cessation.   4. RLE DVT:   - Coumadin now on hold for OR.. Will need heparin bridge.   5. OSA:  - Continue CPAP.No change.    6. Type II diabetes: Metformin.  - Continue sliding scale while inpatient. No change.   7. HTN - MAPs stable. Continue current meds.   I reviewed the LVAD parameters from today, and compared the results to  the patient's prior recorded data.  No programming changes were made.  The LVAD is functioning within specified parameters.  The patient performs LVAD self-test daily.  LVAD interrogation was negative for any significant power changes, alarms or PI events/speed drops.  LVAD equipment check completed and is in good working order.  Back-up equipment present.   LVAD education done on emergency procedures and precautions and reviewed exit site care.   Length of Stay: [phone]  [employer] 12/02/2018, 8:49 AM  VAD Team --- VAD ISSUES ONLY--- Pager [phone] (7am - 7am)  Advanced Heart Failure Team  Pager [phone] (M-F; 7a - 4p)  Please contact [facility] for night-coverage after hours (  4p -7a ) and weekends on [email or web address]  Patient seen with PA, agree with the above note.   Stable today.  Will cover with heparin gtt while INR subtherapeutic.  To OR tomorrow for wound debridement and wound vac.    Marca AnconaDalton Kamry Faraci 12/02/2018

## 2018-12-02 NOTE — Progress Notes (Signed)
CBG 203; patient refuses carb modified/2G Sodium diet; has had family bring in regular potato chips, now eating fried chicken.  Will continue to monitor and encourage compliance.

## 2018-12-02 NOTE — Progress Notes (Signed)
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 [name].   HM III LVAD implanted on 08/03/18 by Dr. Cyndia Bent under Destination Therapy criteria due to smoking status.  Pt sitting up in bed this morning. Says he feels great and is ready to have his procedure tomorrow.  Plan OR onThursday 12/03/2018 for wound debridement with Dr. Prescott Gum.    Vital signs: Temp:  98.5 HR: 94 Doppler Pressure:  80 Automatic BP:  109/85 (94) O2 Sat: 98% on RA Wt:  157>160>159.6>162.0>167.6>169.3>168.0 lbs   LVAD interrogation reveals:  Speed: 5700 Flow: 4.6 Power:  4.4w PI: 3.0 Alarms: none Events:  5-10 daily Hematocrit: 32  Fixed speed: 5700 Low speed limit:  5400   Drive Line site: He is requiring IV pain medication prior to dressing changes; premedicated by bedside RN with IV pain medication. Existing VAD dressing removed and site care performed using sterile technique. Drive line exit site cleaned with betadine swab x 2, allowed to dry, and gauze dressing with silver strip re-applied. Exit site incorporated, but small opening around exit site, the velour is fully implanted at exit site. Small amount yellow drainage with no redness, tenderness, or foul odor noted; rash much improved. Skin prep and tape supplied in kit used. Abdominal area surrounding drive line is edematous (improved slightly) and a hard pocket palpated. Hardness tracks along drive line. Drive line anchor re-applied. Daily dressing changes using daily kit with silver strip required.  Periumbilical counterincision site:  Existing dressing removed and site care performed using sterile technique. Site cleaned with betadine swab x 2, allowed to dry and re-packed with aquacel silver strip, covered with large aquacel and gauze dressing. Moderate amount yellow/green drainage from site, no redness, tenderness, or foul odor noted. Blisters noted on right abdomen are drying up and healing. Daily  dressing changes required by VAD coordinator.          Labs:  LDH trend: 165>127>128>120>140>162>172>193  INR trend: 1.56>1.61>1.70>1.83>2.05>2.49>2.48>2.25  Anticoagulation Plan: -INR Goal:  2.0 - 2.5  -ASA Dose:  81 mg daily - Coumadin re-started 11/24/18  Device: N/A - no ICD  Infection:  - BC's x 2 11/23/18>>NTD - LVAD Wound culture 11/24/18>>staphylococcus aureus   Plan/Recommendations:  1. Contact VAD Coordinator for any VAD equipment or driveline issues. 2. Daily drive line dressing changes;daily periumbilical counterincision site dressing changes.  3. VAD coordinator to accompany patient to OR on Thursday morning.   Emerson Monte RN [place] Coordinator  Office: [phone]  24/7 Pager: [phone]

## 2018-12-02 NOTE — Progress Notes (Signed)
ANTICOAGULATION CONSULT NOTE - Follow-Up Consult  Pharmacy Consult for heparin + warfarin Indication: LVAD  No Known Allergies  Patient Measurements: Height: 5\' 10"  (177.8 cm) Weight: 168 lb (76.2 kg) IBW/kg (Calculated) : 73 Heparin Dosing Weight: 73kg  Vital Signs: Temp: 98.4 F (36.9 C) (01/15 1200) Temp Source: Oral (01/15 1200) BP: 105/88 (01/15 1300) Pulse Rate: 80 (01/15 1200)  Labs: Recent Labs    11/30/18 0523 12/01/18 0424 12/02/18 0505  HGB 9.9* 9.9* 9.7*  HCT 32.7* 32.7* 32.3*  PLT 365 354 369  LABPROT 26.6* 26.5* 21.3*  INR 2.49 2.48 1.87  CREATININE 0.87 0.85 0.83    Estimated Creatinine Clearance: 98.9 mL/min (by C-G formula based on SCr of 0.83 mg/dL).   Medical History: Past Medical History:  Diagnosis Date  . Cardiomyopathy, unspecified (HCC)   . CHF (congestive heart failure) (HCC)   . Chronic back pain   . Enlarged heart   . Gastric ulcer   . Gastroenteritis   . H/O degenerative disc disease     Assessment: 38 yoM with LVAD admitted with driveline infection. INR was subtherapeutic on admit so heparin started and warfarin held for now in case procedures are needed involving wound.   Warfarin resumed 1/7 but now on hold for 3 days with plans for OR tomorrow. INR 1.87, likely to be subtherapeutic this afternoon. Discussed with HF team and PVT - will start low-dose heparin bridge this evening and stop on call to OR. Pt previously therapeutic on 1600 units/hr.  *PTA Warfarin Dose = 7.5mg  Tues/Thurs/Sat/Sun, 5mg  Mon/Wed/Fri  Goal of Therapy:  INR 2-2.5 Heparin level 0.3-0.5 (lower goal with upcoming OR) Monitor platelets by anticoagulation protocol: Yes   Plan:  -Hold warfarin again tonight -Start heparin 1300 units/hr with no bolus at 1800 tonight - check 6hr heparin level -Stop heparin on call to OR    Fredonia Highland, PharmD, BCPS Clinical Pharmacist [phone] Please check AMION for all [facility] numbers 12/02/2018

## 2018-12-02 NOTE — Progress Notes (Signed)
Procedure(s) (LRB): ABDOMINAL WOUND DEBRIDEMENT (N/A) APPLICATION OF WOUND VAC (N/A) Subjective: Patient remains comfortable, ambulating in hallway Abdominal wound with minimal improvement after a week of packing wound with Aquacel and IV antibiotics We will proceed with debridement of abdominal wound and placement of wound VAC.  Coumadin has been held for 3 days.  Procedure, benefits, and risks discussed with patient.  Objective: Vital signs in last 24 hours: Temp:  [97.6 F (36.4 C)-98.5 F (36.9 C)] 98.4 F (36.9 C) (01/15 1200) Pulse Rate:  [53-104] 80 (01/15 1200) Cardiac Rhythm: Normal sinus rhythm (01/15 1300) Resp:  [16-25] 22 (01/15 1200) BP: (75-109)/(45-88) 105/88 (01/15 1300) SpO2:  [97 %-99 %] 97 % (01/15 1200) Weight:  [76.2 kg-76.8 kg] 76.2 kg (01/15 0300)  Hemodynamic parameters for last 24 hours:  Stable Intake/Output from previous day: 01/14 0701 - 01/15 0700 In: 930 [P.O.:720; I.V.:10; IV Piggyback:200] Out: 2 [Urine:1; Stool:1] Intake/Output this shift: Total I/O In: 240 [P.O.:240] Out: -   Exam Lungs clear Abdominal bandage intact Minimal peripheral edema Normal VAD Hum Neuro intact  Lab Results: Recent Labs    12/01/18 0424 12/02/18 0505  WBC 8.0 7.8  HGB 9.9* 9.7*  HCT 32.7* 32.3*  PLT 354 369   BMET:  Recent Labs    12/01/18 0424 12/02/18 0505  NA 142 142  K 4.0 3.8  CL 107 108  CO2 31 28  GLUCOSE 90 112*  BUN 7 8  CREATININE 0.85 0.83  CALCIUM 9.0 9.0    PT/INR:  Recent Labs    12/02/18 0505  LABPROT 21.3*  INR 1.87   ABG    Component Value Date/Time   PHART 7.473 (H) 08/05/2018 0345   HCO3 26.9 08/05/2018 0345   TCO2 26 08/04/2018 1622   O2SAT 73.6 08/12/2018 0345   CBG (last 3)  Recent Labs    12/01/18 2150 12/02/18 0821 12/02/18 1158  GLUCAP 104* 98 95    Assessment/Plan: S/P Procedure(s) (LRB): ABDOMINAL WOUND DEBRIDEMENT (N/A) APPLICATION OF WOUND VAC (N/A) Plan to open power cord tunnel,  debride abscess, irrigate and place wound VAC tomorrow under anesthesia.  Risks of bleeding, persistent infection, and pain discussed with patient.   LOS: 9 days    [name] 12/02/2018

## 2018-12-03 ENCOUNTER — Inpatient Hospital Stay (HOSPITAL_COMMUNITY): Payer: Medicare HMO | Admitting: Registered Nurse

## 2018-12-03 ENCOUNTER — Encounter (HOSPITAL_COMMUNITY): Payer: Self-pay | Admitting: *Deleted

## 2018-12-03 ENCOUNTER — Encounter (HOSPITAL_COMMUNITY)
Admission: AD | Disposition: A | Discharge status: Home Health | Payer: Self-pay | Source: Ambulatory Visit | Attending: Cardiology

## 2018-12-03 DIAGNOSIS — T827XXA Infection and inflammatory reaction due to other cardiac and vascular devices, implants and grafts, initial encounter: Secondary | ICD-10-CM

## 2018-12-03 HISTORY — PX: APPLICATION OF WOUND VAC: SHX5189

## 2018-12-03 HISTORY — PX: STERNAL WOUND DEBRIDEMENT: SHX1058

## 2018-12-03 LAB — BASIC METABOLIC PANEL
Anion gap: 7 (ref 5–15)
BUN: 8 mg/dL (ref 6–20)
CO2: 24 mmol/L (ref 22–32)
Calcium: 8.9 mg/dL (ref 8.9–10.3)
Chloride: 108 mmol/L (ref 98–111)
Creatinine, Ser: 0.89 mg/dL (ref 0.61–1.24)
GFR calc Af Amer: >60 mL/min (ref >60)
GFR calc non Af Amer: >60 mL/min (ref >60)
Glucose, Bld: 138 mg/dL — ABNORMAL HIGH (ref 70–99)
Potassium: 3.9 mmol/L (ref 3.5–5.1)
Sodium: 139 mmol/L (ref 135–145)

## 2018-12-03 LAB — PROTIME-INR
INR: 1.47
Prothrombin Time: 17.7 seconds — ABNORMAL HIGH (ref 11.4–15.2)

## 2018-12-03 LAB — CBC
HCT: 31.5 % — ABNORMAL LOW (ref 39.0–52.0)
Hemoglobin: 9.5 g/dL — ABNORMAL LOW (ref 13.0–17.0)
MCH: 24.2 pg — ABNORMAL LOW (ref 26.0–34.0)
MCHC: 30.2 g/dL (ref 30.0–36.0)
MCV: 80.4 fL (ref 80.0–100.0)
PLATELETS: 362 10*3/uL (ref 150–400)
RBC: 3.92 MIL/uL — ABNORMAL LOW (ref 4.22–5.81)
RDW: 16.2 % — ABNORMAL HIGH (ref 11.5–15.5)
WBC: 7.6 10*3/uL (ref 4.0–10.5)
nRBC: 0 % (ref 0.0–0.2)

## 2018-12-03 LAB — LACTATE DEHYDROGENASE: LDH: 180 U/L (ref 98–192)

## 2018-12-03 LAB — GLUCOSE, CAPILLARY
Glucose-Capillary: 97 mg/dL (ref 70–99)
Glucose-Capillary: 102 mg/dL — ABNORMAL HIGH (ref 70–99)
Glucose-Capillary: 188 mg/dL — ABNORMAL HIGH (ref 70–99)
Glucose-Capillary: 221 mg/dL — ABNORMAL HIGH (ref 70–99)

## 2018-12-03 LAB — HEPARIN LEVEL (UNFRACTIONATED): Heparin Unfractionated: 0.26 IU/mL — ABNORMAL LOW (ref 0.30–0.70)

## 2018-12-03 SURGERY — DEBRIDEMENT, WOUND, STERNUM
Anesthesia: General

## 2018-12-03 MED ORDER — LACTATED RINGERS IV SOLN
INTRAVENOUS | Status: DC | PRN
  Administered 2018-12-03: 09:00:00 via INTRAVENOUS

## 2018-12-03 MED ORDER — VANCOMYCIN HCL 1000 MG IV SOLR
INTRAVENOUS | Status: AC
  Filled 2018-12-03: qty 1000

## 2018-12-03 MED ORDER — HYDROMORPHONE HCL 1 MG/ML IJ SOLN
1.0000 mg | Freq: Once | INTRAMUSCULAR | Status: AC
  Administered 2018-12-03: 1 mg via INTRAVENOUS

## 2018-12-03 MED ORDER — EPHEDRINE SULFATE-NACL 50-0.9 MG/10ML-% IV SOSY
PREFILLED_SYRINGE | INTRAVENOUS | Status: DC | PRN
  Administered 2018-12-03: 5 mg via INTRAVENOUS

## 2018-12-03 MED ORDER — ALBUMIN HUMAN 5 % IV SOLN
INTRAVENOUS | Status: DC | PRN
  Administered 2018-12-03 (×2): via INTRAVENOUS

## 2018-12-03 MED ORDER — ONDANSETRON HCL 4 MG/2ML IJ SOLN
INTRAMUSCULAR | Status: DC | PRN
  Administered 2018-12-03: 4 mg via INTRAVENOUS

## 2018-12-03 MED ORDER — PROPOFOL 10 MG/ML IV BOLUS
INTRAVENOUS | Status: AC
  Filled 2018-12-03: qty 20

## 2018-12-03 MED ORDER — LIDOCAINE 2% (20 MG/ML) 5 ML SYRINGE
INTRAMUSCULAR | Status: AC
  Filled 2018-12-03: qty 5

## 2018-12-03 MED ORDER — WARFARIN SODIUM 5 MG PO TABS
5.0000 mg | ORAL_TABLET | Freq: Once | ORAL | Status: AC
  Administered 2018-12-03: 5 mg via ORAL
  Filled 2018-12-03: qty 1

## 2018-12-03 MED ORDER — FENTANYL CITRATE (PF) 250 MCG/5ML IJ SOLN
INTRAMUSCULAR | Status: DC | PRN
  Administered 2018-12-03: 100 ug via INTRAVENOUS
  Administered 2018-12-03 (×6): 50 ug via INTRAVENOUS

## 2018-12-03 MED ORDER — ONDANSETRON HCL 4 MG/2ML IJ SOLN
INTRAMUSCULAR | Status: AC
  Filled 2018-12-03: qty 2

## 2018-12-03 MED ORDER — SODIUM CHLORIDE 0.9 % IV SOLN
INTRAVENOUS | Status: DC | PRN
  Administered 2018-12-03: 25 ug/min via INTRAVENOUS

## 2018-12-03 MED ORDER — SUGAMMADEX SODIUM 200 MG/2ML IV SOLN
INTRAVENOUS | Status: DC | PRN
  Administered 2018-12-03: 200 mg via INTRAVENOUS
  Administered 2018-12-03: 100 mg via INTRAVENOUS

## 2018-12-03 MED ORDER — FENTANYL CITRATE (PF) 250 MCG/5ML IJ SOLN
INTRAMUSCULAR | Status: AC
  Filled 2018-12-03: qty 5

## 2018-12-03 MED ORDER — HEPARIN (PORCINE) 25000 UT/250ML-% IV SOLN
1000.0000 [IU]/h | INTRAVENOUS | Status: DC
  Administered 2018-12-03 – 2018-12-04 (×2): 600 [IU]/h via INTRAVENOUS
  Administered 2018-12-07: 800 [IU]/h via INTRAVENOUS
  Administered 2018-12-08: 1000 [IU]/h via INTRAVENOUS
  Filled 2018-12-03 (×4): qty 250

## 2018-12-03 MED ORDER — LACTATED RINGERS IV SOLN
INTRAVENOUS | Status: DC
  Administered 2018-12-03: 08:00:00 via INTRAVENOUS

## 2018-12-03 MED ORDER — LIDOCAINE 2% (20 MG/ML) 5 ML SYRINGE
INTRAMUSCULAR | Status: DC | PRN
  Administered 2018-12-03: 100 mg via INTRAVENOUS

## 2018-12-03 MED ORDER — ROCURONIUM BROMIDE 10 MG/ML (PF) SYRINGE
PREFILLED_SYRINGE | INTRAVENOUS | Status: DC | PRN
  Administered 2018-12-03: 50 mg via INTRAVENOUS

## 2018-12-03 MED ORDER — CEFAZOLIN SODIUM-DEXTROSE 2-4 GM/100ML-% IV SOLN
2.0000 g | Freq: Three times a day (TID) | INTRAVENOUS | Status: DC
  Administered 2018-12-04 – 2018-12-10 (×19): 2 g via INTRAVENOUS
  Filled 2018-12-03 (×20): qty 100

## 2018-12-03 MED ORDER — PHENYLEPHRINE 40 MCG/ML (10ML) SYRINGE FOR IV PUSH (FOR BLOOD PRESSURE SUPPORT)
PREFILLED_SYRINGE | INTRAVENOUS | Status: DC | PRN
  Administered 2018-12-03 (×6): 80 ug via INTRAVENOUS

## 2018-12-03 MED ORDER — VANCOMYCIN HCL 1000 MG IV SOLR
INTRAVENOUS | Status: DC | PRN
  Administered 2018-12-03: 1000 mg

## 2018-12-03 MED ORDER — PROPOFOL 10 MG/ML IV BOLUS
INTRAVENOUS | Status: DC | PRN
  Administered 2018-12-03: 170 mg via INTRAVENOUS

## 2018-12-03 MED ORDER — SODIUM CHLORIDE 0.9 % IR SOLN
Status: DC | PRN
  Administered 2018-12-03: 1000 mL

## 2018-12-03 MED ORDER — DEXAMETHASONE SODIUM PHOSPHATE 10 MG/ML IJ SOLN
INTRAMUSCULAR | Status: DC | PRN
  Administered 2018-12-03: 4 mg via INTRAVENOUS

## 2018-12-03 MED ORDER — ROCURONIUM BROMIDE 50 MG/5ML IV SOSY
PREFILLED_SYRINGE | INTRAVENOUS | Status: AC
  Filled 2018-12-03: qty 5

## 2018-12-03 SURGICAL SUPPLY — 81 items
ATTRACTOMAT 16X20 MAGNETIC DRP (DRAPES) ×3 IMPLANT
BAG DECANTER FOR FLEXI CONT (MISCELLANEOUS) ×3 IMPLANT
BENZOIN TINCTURE PRP APPL 2/3 (GAUZE/BANDAGES/DRESSINGS) IMPLANT
BLADE SURG 10 STRL SS (BLADE) ×3 IMPLANT
BLADE SURG 15 STRL LF DISP TIS (BLADE) IMPLANT
BLADE SURG 15 STRL SS (BLADE)
BNDG GAUZE ELAST 4 BULKY (GAUZE/BANDAGES/DRESSINGS) IMPLANT
CANISTER SUCT 3000ML PPV (MISCELLANEOUS) ×3 IMPLANT
CATH FOLEY 2WAY SLVR  5CC 16FR (CATHETERS)
CATH FOLEY 2WAY SLVR 5CC 16FR (CATHETERS) IMPLANT
CATH THORACIC 28FR RT ANG (CATHETERS) IMPLANT
CATH THORACIC 36FR (CATHETERS) IMPLANT
CATH THORACIC 36FR RT ANG (CATHETERS) IMPLANT
CLIP VESOCCLUDE SM WIDE 24/CT (CLIP) IMPLANT
CONN Y 3/8X3/8X3/8  BEN (MISCELLANEOUS)
CONN Y 3/8X3/8X3/8 BEN (MISCELLANEOUS) IMPLANT
CONT SPEC 4OZ CLIKSEAL STRL BL (MISCELLANEOUS) IMPLANT
COVER SURGICAL LIGHT HANDLE (MISCELLANEOUS) ×8 IMPLANT
COVER WAND RF STERILE (DRAPES) ×3 IMPLANT
DEPRESSOR TONGUE BLADE STERILE (MISCELLANEOUS) ×2 IMPLANT
DRAPE LAPAROSCOPIC ABDOMINAL (DRAPES) ×3 IMPLANT
DRAPE SLUSH/WARMER DISC (DRAPES) IMPLANT
DRAPE WARM FLUID 44X44 (DRAPE) IMPLANT
DRSG AQUACEL AG ADV 3.5X14 (GAUZE/BANDAGES/DRESSINGS) ×3 IMPLANT
DRSG CUTIMED SORBACT 7X9 (GAUZE/BANDAGES/DRESSINGS) ×2 IMPLANT
DRSG PAD ABDOMINAL 8X10 ST (GAUZE/BANDAGES/DRESSINGS) IMPLANT
DRSG TEGADERM 4X4.75 (GAUZE/BANDAGES/DRESSINGS) ×8 IMPLANT
DRSG VAC ATS LRG SENSATRAC (GAUZE/BANDAGES/DRESSINGS) ×3 IMPLANT
DRSG VAC ATS MED SENSATRAC (GAUZE/BANDAGES/DRESSINGS) ×3 IMPLANT
DRSG VAC ATS SM SENSATRAC (GAUZE/BANDAGES/DRESSINGS) ×5 IMPLANT
ELECT BLADE 4.0 EZ CLEAN MEGAD (MISCELLANEOUS) ×3
ELECT REM PT RETURN 9FT ADLT (ELECTROSURGICAL) ×3
ELECTRODE BLDE 4.0 EZ CLN MEGD (MISCELLANEOUS) IMPLANT
ELECTRODE REM PT RTRN 9FT ADLT (ELECTROSURGICAL) ×1 IMPLANT
FILTER STRAW FLUID ASPIR (MISCELLANEOUS) ×2 IMPLANT
GAUZE SPONGE 4X4 12PLY STRL (GAUZE/BANDAGES/DRESSINGS) ×3 IMPLANT
GAUZE XEROFORM 5X9 LF (GAUZE/BANDAGES/DRESSINGS) IMPLANT
GLOVE BIO SURGEON STRL SZ7.5 (GLOVE) ×6 IMPLANT
GOWN STRL REUS W/ TWL LRG LVL3 (GOWN DISPOSABLE) ×4 IMPLANT
GOWN STRL REUS W/TWL LRG LVL3 (GOWN DISPOSABLE) ×8
HANDPIECE INTERPULSE COAX TIP (DISPOSABLE) ×2
HEMOSTAT POWDER SURGIFOAM 1G (HEMOSTASIS) IMPLANT
HEMOSTAT SURGICEL 2X14 (HEMOSTASIS) IMPLANT
KIT BASIN OR (CUSTOM PROCEDURE TRAY) ×3 IMPLANT
KIT SUCTION CATH 14FR (SUCTIONS) IMPLANT
KIT TURNOVER KIT B (KITS) ×3 IMPLANT
MATRIX WOUND 3-LAYER 7X10 (Tissue) ×1 IMPLANT
MICROMATRIX 1000MG (Tissue) ×6 IMPLANT
NS IRRIG 1000ML POUR BTL (IV SOLUTION) ×3 IMPLANT
PACK CHEST (CUSTOM PROCEDURE TRAY) ×3 IMPLANT
PACK GENERAL/GYN (CUSTOM PROCEDURE TRAY) ×3 IMPLANT
PAD ARMBOARD 7.5X6 YLW CONV (MISCELLANEOUS) ×6 IMPLANT
SET HNDPC FAN SPRY TIP SCT (DISPOSABLE) ×1 IMPLANT
SOL PREP POV-IOD 4OZ 10% (MISCELLANEOUS) IMPLANT
SOLUTION PARTIC MCRMTRX 1000MG (Tissue) IMPLANT
SPONGE LAP 18X18 X RAY DECT (DISPOSABLE) ×3 IMPLANT
SPONGE LAP 4X18 RFD (DISPOSABLE) ×4 IMPLANT
STAPLER VISISTAT 35W (STAPLE) IMPLANT
STRAP MONTGOMERY 1.25X11-1/8 (MISCELLANEOUS) IMPLANT
SUT ETHILON 3 0 FSL (SUTURE) IMPLANT
SUT SILK 2 0 SH (SUTURE) ×2 IMPLANT
SUT STEEL 6MS V (SUTURE) IMPLANT
SUT STEEL STERNAL CCS#1 18IN (SUTURE) IMPLANT
SUT STEEL SZ 6 DBL 3X14 BALL (SUTURE) IMPLANT
SUT VIC AB 1 CTX 36 (SUTURE) ×4
SUT VIC AB 1 CTX36XBRD ANBCTR (SUTURE) ×2 IMPLANT
SUT VIC AB 2-0 CTX 27 (SUTURE) ×6 IMPLANT
SUT VIC AB 3-0 SH 8-18 (SUTURE) ×2 IMPLANT
SUT VIC AB 3-0 X1 27 (SUTURE) ×6 IMPLANT
SWAB COLLECTION DEVICE MRSA (MISCELLANEOUS) ×2 IMPLANT
SWAB CULTURE ESWAB REG 1ML (MISCELLANEOUS) IMPLANT
SYR 20ML ECCENTRIC (SYRINGE) ×2 IMPLANT
SYR 3ML LL SCALE MARK (SYRINGE) ×2 IMPLANT
SYR 5ML LL (SYRINGE) IMPLANT
SYR CONTROL 10ML LL (SYRINGE) ×2 IMPLANT
TOWEL GREEN STERILE (TOWEL DISPOSABLE) ×3 IMPLANT
TOWEL GREEN STERILE FF (TOWEL DISPOSABLE) ×3 IMPLANT
TRAY FOLEY MTR SLVR 16FR STAT (SET/KITS/TRAYS/PACK) IMPLANT
WATER STERILE IRR 1000ML POUR (IV SOLUTION) ×3 IMPLANT
WND VAC CANISTER 500ML (MISCELLANEOUS) ×5 IMPLANT
WOUND MATRIX 3-LAYER 7X10 (Tissue) ×1 IMPLANT

## 2018-12-03 NOTE — Brief Op Note (Signed)
12/03/2018  10:53 AM  PATIENT:  Theodore Welch  58 y.o. male  PRE-OPERATIVE DIAGNOSIS:  WOUND INFECTION abdominal wall  POST-OPERATIVE DIAGNOSIS:  WOUND INFECTION abdominal wall  PROCEDURE: Excisional debridement, irrigation, application of A-Cell powder and membrane, application of wound VAC  SURGEON:  Surgeon(s) and Role:    Kerin Perna, MD - Primary  PHYSICIAN ASSISTANT: NA  ASSISTANTS: none   ANESTHESIA:   general  EBL:  25 mL   BLOOD ADMINISTERED:none  DRAINS: wound vac   LOCAL MEDICATIONS USED:  NONE  SPECIMEN:  Scraping  DISPOSITION OF SPECIMEN:  microbiology  COUNTS:  YES  TOURNIQUET:  * No tourniquets in log *  DICTATION: .Dragon Dictation  PLAN OF CARE: return to 2 C  PATIENT DISPOSITION:  PACU - hemodynamically stable.   Delay start of Pharmacological VTE agent (>24hrs) due to surgical blood loss or risk of bleeding: yes  discussed with Charlaine Dalton, Pharmacy

## 2018-12-03 NOTE — Anesthesia Preprocedure Evaluation (Addendum)
Anesthesia Evaluation  Patient identified by MRN, date of birth, ID band Patient awake    Reviewed: Allergy & Precautions, NPO status , Patient's Chart, lab work & pertinent test results  History of Anesthesia Complications Negative for: history of anesthetic complications  Airway Mallampati: II  TM Distance: >3 FB Neck ROM: Full    Dental no notable dental hx. (+) Dental Advisory Given   Pulmonary sleep apnea and Continuous Positive Airway Pressure Ventilation , COPD, Current Smoker,    Pulmonary exam normal        Cardiovascular hypertension, +CHF and + DVT  Normal cardiovascular exam  Study Conclusions  - Left ventricle: The cavity size was moderately dilated. Wall thickness was normal. Systolic function was severely reduced. The estimated ejection fraction was in the range of 20% to 25%. Severe diffuse hypokinesis with no identifiable regional variations. Features are consistent with a pseudonormal left ventricular filling pattern, with concomitant abnormal relaxation and increased filling pressure (grade 2 diastolic dysfunction). No evidence of thrombus. - Ventricular septum: The contour showed diastolic flattening. These changes are consistent with RV volume overload. - Mitral valve: There was mild to moderate regurgitation directed centrally. - Left atrium: The atrium was moderately dilated. - Right ventricle: The cavity size was moderately dilated. - Right atrium: The atrium was moderately dilated. - Tricuspid valve: There was malcoaptation of the valve leaflets. There was moderate regurgitation directed centrally. - Pulmonary arteries: Systolic pressure was moderately increased. PA peak pressure: 51 mm Hg (S).      Neuro/Psych negative neurological ROS  negative psych ROS   GI/Hepatic Neg liver ROS, PUD,   Endo/Other  diabetes  Renal/GU negative Renal ROS     Musculoskeletal negative musculoskeletal  ROS (+)   Abdominal   Peds  Hematology negative hematology ROS (+)   Anesthesia Other Findings Day of surgery medications reviewed with the patient.  Reproductive/Obstetrics                             Anesthesia Physical  Anesthesia Plan  ASA: IV  Anesthesia Plan: General   Post-op Pain Management:    Induction: Intravenous  PONV Risk Score and Plan: 2 and Ondansetron and Dexamethasone  Airway Management Planned: Oral ETT  Additional Equipment:   Intra-op Plan:   Post-operative Plan: Extubation in OR  Informed Consent: I have reviewed the patients History and Physical, chart, labs and discussed the procedure including the risks, benefits and alternatives for the proposed anesthesia with the patient or authorized representative who has indicated his/her understanding and acceptance.     Dental advisory given  Plan Discussed with: Anesthesiologist, CRNA and Surgeon  Anesthesia Plan Comments:        Anesthesia Quick Evaluation

## 2018-12-03 NOTE — Op Note (Signed)
NAME: Theodore Welch, [place] MEDICAL RECORD [id number] ACCOUNT [id number] DATE OF BIRTH:01-Aug-1961 FACILITY: [facility] LOCATION: [facility] PHYSICIAN:PETER VAN TRIGT III, MD  OPERATIVE REPORT  DATE OF PROCEDURE:  12/03/2018  PROCEDURE: 1.  Debridement of abdominal wound infection, left ventricular assist device power cord incision. 2.  Irrigation of abdominal wound. 3.  Application of ACell powder and ACell membrane to abdominal wound. 4.  Wound vacuum-assisted closure application to open abdominal wound.  SURGEON:  [name], MD  ANESTHESIA:  General.  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  DESCRIPTION OF PROCEDURE:  After informed consent was obtained in the preop holding area and documentation was completed and all questions addressed, the patient was brought to the operating room.  The LVAD coordinator was in attendance for the entire  procedure.  General anesthesia was induced, and the patient remained stable.  The LVAD flows remained normal.  The parameters were monitored during the entire operation by the LVAD coordinator.  The patient was prepped and draped as a sterile field after the previously placed packing in the power cord tunnel was removed.  A proper out time-out was performed, and the patient was prepped and draped as a sterile field from the mid chest to the  lower abdomen.  A wound culture was obtained.  The wound was explored, and there was a space from the exit site of the power cord up to the mid abdominal incision for the power cord tunnel.  This tunnel was opened from the skin down to the rectus sheath.  There was no gross pus, but the tissues were  indurated, and there was friable granulation tissue.  All of this abnormal tissue was excised.  Hemostasis was obtained.  The wound  was irrigated with vancomycin irrigation.  We next placed 2 g of powder of ACell material into the wound.  This was covered with a sheet of ACell membrane, tacked to the margin of the wound with interrupted 3-0 Vicryl sutures.  This was covered with a Sorbact covering, also tacked to the skin  edges with 3-0 Vicryl.  This was covered with lubricant, and then a small wound VAC sponge was cut to the appropriate size and shape, covered with a wound VAC sheet, and wound VAC suction cord was applied.  There was a good vacuum compression of the  wound VAC sponge.  The patient was reversed from anesthesia, extubated and returned to recovery room in stable condition.  LN/NUANCE  D:12/03/2018 T:12/03/2018 JOB:[id number]

## 2018-12-03 NOTE — Progress Notes (Signed)
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 Shirlee Latch.   HM III LVAD implanted on 08/03/18 by Dr. Laneta Simmers under Destination Therapy criteria due to smoking status.  Pt currently in Short Stay awaiting wound debridement of driveline and periumbilical counterincision site today with Dr. Donata Clay. Pt states that he is ready for surgery.  Vital signs: Temp:  98.1 HR: 73 Doppler Pressure:  82 Automatic BP:  109/67 (82) O2 Sat: 100% on RA Wt:  157>160>159.6>162.0>167.6>169.3>168.0 lbs   LVAD interrogation reveals:  Speed: 5700 Flow: 4.8 Power:  4.4w PI: 3.1 Alarms: none Events:  5-10 daily Hematocrit: 32  Fixed speed: 5700 Low speed limit:  5400   Drive Line site: CDI-wound vac will be applied in OR today.  Periumbilical counterincision site:  CDI-wound vac will be applied in OR today.         Labs:  LDH trend: 165>127>128>120>140>162>172>193>180  INR trend: 1.56>1.61>1.70>1.83>2.05>2.49>2.48>2.25>1.47  Anticoagulation Plan: -INR Goal:  2.0 - 2.5  -ASA Dose:  81 mg daily - Coumadin re-started 11/24/18  Device: N/A - no ICD  Infection:  - BC's x 2 11/23/18>>NTD - LVAD Wound culture 11/24/18>>staphylococcus aureus   Plan/Recommendations:  1. Contact VAD Coordinator for any VAD equipment or driveline issues. 2. Daily drive line dressing changes;daily periumbilical counterincision site dressing changes.  3. VAD coordinator to accompany patient to OR on today. 4. Please see wound care in VAD coordinator procedure note from OR later today.   [name] RN VAD Coordinator  Office: [phone]  24/7 Pager: [phone]

## 2018-12-03 NOTE — Progress Notes (Signed)
Pt returned to unit with vad co-ordinator,made comfortable in bed ,care continues.

## 2018-12-03 NOTE — Anesthesia Postprocedure Evaluation (Signed)
Anesthesia Post Note  Patient: Theodore Welch  Procedure(s) Performed: WOUND DEBRIDEMENT WITH A-CELL (N/A ) APPLICATION OF WOUND VAC (N/A )     Patient location during evaluation: PACU Anesthesia Type: General Level of consciousness: sedated Pain management: pain level controlled Vital Signs Assessment: post-procedure vital signs reviewed and stable Respiratory status: spontaneous breathing and respiratory function stable Cardiovascular status: stable Postop Assessment: no apparent nausea or vomiting Anesthetic complications: no    Last Vitals:  Vitals:   12/03/18 1100 12/03/18 1117  BP: 99/81   Pulse: 86 86  Resp: 13   Temp: 36.5 C 36.7 C  SpO2: 98%     Last Pain:  Vitals:   12/03/18 1200  TempSrc:   PainSc: 6                  Reia Viernes DANIEL

## 2018-12-03 NOTE — Progress Notes (Addendum)
Advanced Heart Failure VAD Team Note  PCP-Cardiologist: No primary care provider on file.   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 Initially on  vancomycin and cefepime. Antibiotics narrowed to cefazolin. Finished 5 day course of diflucan for associated fungal rash.   Dr. Donata Clay assessed 11/30/2018. To OR 12/03/2018.  Feeling OK this am. Plan for OR. He is ready and on batteries. Denies SOB, lightheadedness, or dizziness. Mild discomfort around driveline.   Wound CX- Staph aureus -->MSSA Blood CX- NGTD.    Hgb stable 9.5. WBC 7.6. INR 1.47, covering with heparin for OR today.   LVAD Interrogation HM 3: Speed: 5700 Flow: 4.7 PI: 3.1 Power: 4.0. 10-20 PI events daily  Objective:    Vital Signs:   Temp:  [97.1 F (36.2 C)-98.5 F (36.9 C)] 98.2 F (36.8 C) (01/16 0300) Pulse Rate:  [67-104] 70 (01/16 0300) Resp:  [16-31] 21 (01/16 0300) BP: (76-109)/(58-88) 97/84 (01/16 0300) SpO2:  [97 %-99 %] 99 % (01/16 0300) Weight:  [76.5 kg] 76.5 kg (01/16 0300) Last BM Date: 12/02/18   Mean arterial Pressure 80-90s  Intake/Output:   Intake/Output Summary (Last 24 hours) at 12/03/2018 0731 Last data filed at 12/03/2018 0443 Gross per 24 hour  Intake 793.59 ml  Output -  Net 793.59 ml     Physical Exam   General: NAD HEENT: Normal. Neck: Supple, JVP 7-8 cm. Carotids OK.  Cardiac:  Mechanical heart sounds with LVAD hum present.  Lungs:  CTAB, normal effort.  Abdomen:  NT, ND, no HSM. No bruits or masses. +BS  LVAD exit site: Bulky dressing in place. Stabilization device present and accurately applied. Driveline dressing changed daily per sterile technique. Extremities:  Warm and dry. No cyanosis, clubbing, rash, or edema.  Neuro:  Alert & oriented x 3. Cranial nerves grossly intact. Moves all 4 extremities w/o difficulty. Affect pleasant     Telemetry   NSR 70-80s, personally reviewed.   EKG    No new tracings.     Labs   Basic Metabolic Panel: Recent Labs  Lab 11/29/18 0447 11/30/18 0523 12/01/18 0424 12/02/18 0505 12/03/18 0025  NA 139 142 142 142 139  K 4.0 4.1 4.0 3.8 3.9  CL 105 106 107 108 108  CO2 29 29 31 28 24   GLUCOSE 97 85 90 112* 138*  BUN 7 8 7 8 8   CREATININE 1.08 0.87 0.85 0.83 0.89  CALCIUM 8.8* 9.1 9.0 9.0 8.9    Liver Function Tests: No results for input(s): AST, ALT, ALKPHOS, BILITOT, PROT, ALBUMIN in the last 168 hours. No results for input(s): LIPASE, AMYLASE in the last 168 hours. No results for input(s): AMMONIA in the last 168 hours.  CBC: Recent Labs  Lab 11/29/18 0447 11/30/18 0523 12/01/18 0424 12/02/18 0505 12/03/18 0025  WBC 7.4 8.0 8.0 7.8 7.6  HGB 9.3* 9.9* 9.9* 9.7* 9.5*  HCT 31.6* 32.7* 32.7* 32.3* 31.5*  MCV 82.3 82.4 82.8 82.0 80.4  PLT 305 365 354 369 362    INR: Recent Labs  Lab 11/29/18 0447 11/30/18 0523 12/01/18 0424 12/02/18 0505 12/03/18 0025  INR 2.58 2.49 2.48 1.87 1.47    Other results:  EKG:    Imaging   No results found.   Medications:     Scheduled Medications: . amLODipine  5 mg Oral Daily  . aspirin EC  81 mg Oral Daily  . atorvastatin  20 mg Oral Daily  .  carbamide peroxide  5 drop Both EARS TID  . DULoxetine  60 mg Oral Daily  . gabapentin  600 mg Oral TID  . insulin aspart  0-9 Units Subcutaneous TID WC  . metFORMIN  500 mg Oral BID WC  . nystatin   Topical BID  . pantoprazole  40 mg Oral Daily  . sacubitril-valsartan  1 tablet Oral BID  . sodium chloride flush  10-40 mL Intracatheter Q12H  . spironolactone  25 mg Oral Daily  . thiamine  100 mg Oral Daily  . vitamin B-12  1,000 mcg Oral Daily  . Warfarin - Pharmacist Dosing Inpatient   Does not apply q1800    Infusions: . sodium chloride Stopped (11/23/18 1702)  .  ceFAZolin (ANCEF) IV 2 g (12/03/18 0448)  . heparin 1,300 Units/hr (12/03/18 0443)    PRN Medications: sodium chloride, acetaminophen, diphenhydrAMINE, fluticasone,  HYDROmorphone (DILAUDID) injection, hydrOXYzine, ipratropium-albuterol, methocarbamol, ondansetron (ZOFRAN) IV, oxyCODONE, sodium chloride flush, traMADol   Patient Profile  58 yo with history of nonischemic cardiomyopathy, RLE DVT, cirrhosis, smoking, OSA, and HMIII LVAD 07/2018.   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  Assessment/Plan:    1. LVAD Driveline infection.  - Wound CX- Few Staphylococcus Aureus  --> MSSA.  Transitioned to cefazolin per ID, will need prolonged course to heal.  Completed 5 day course of fluconazole on 11/28/18 for surrounding fungal infection - CT with small amount of inflammation and fluid along the driveline.  - Pain and drainage much improved.  - Blood CX -NGTD. - PICC line placed 11/26/2018.   - Referred to [facility] for home antibiotics. Will continue for 24 days total from 11/26/2018. - Plan for OR debridement per Dr Maren Beach this am. +/- Wound vac.  - INR 1.47. On heparin bridge for OR today.   2. Chronic systolic CHF s/p HM-III VAD: Nonischemic cardiomyopathy, now s/p Heartmate 3 LVAD in 9/19.  - VAD interrogated personally. Parameters stable.   - Volume status stable off po lasix.  - Continue spironolactone25mg  daily.  -ContinueEntresto 97/103 mg BID.  - Continue amlodipine 5 mg daily. - Continue ASA 81 daily.  - Continue warfarin for INR 2-2.5.  - INR 1.47. On hold for OR today. Dosing per PharmD when resumed.   3. Smoking:  - Still smoking a few cigs/day as outpatient. Using nicotine patches. Knows he needs to quit to be a transplant candidate. Encouraged cessation.   4. RLE DVT:   - Coumadin now on hold for OR. On heparin bridge.   5. OSA:  - Continue CPAP. No change.   6. Type II diabetes: Metformin.  - Continue sliding scale while inpatient. No change.    7. HTN - MAPs stable on meds as above.   I reviewed the LVAD parameters from today, and compared the results to the patient's prior recorded data.  No programming changes were  made.  The LVAD is functioning within specified parameters.  The patient performs LVAD self-test daily.  LVAD interrogation was negative for any significant power changes, alarms or PI events/speed drops.  LVAD equipment check completed and is in good working order.  Back-up equipment present.   LVAD education done on emergency procedures and precautions and reviewed exit site care.   Length of Stay: [address]  Theodore Welch 12/03/2018, 7:31 AM  VAD Team --- VAD ISSUES ONLY--- Pager [phone] (7am - 7am)  Advanced Heart Failure Team  Pager [phone] (M-F; 7a - 4p)  Please contact [facility] for night-coverage after hours (  4p -7a ) and weekends on [email or web address]  Patient seen with PA, agree with the above note.  He is going to OR today for wound debridement and possible wound vac.   Stable clinically, LVAD parameters reviewed and stable.  MAP controlled.   Will need to restart anticoagulation post-op.   Theodore Welch 12/03/2018 7:48 AM

## 2018-12-03 NOTE — Progress Notes (Signed)
ANTICOAGULATION CONSULT NOTE - Follow-Up Consult  Pharmacy Consult for heparin + warfarin Indication: LVAD  No Known Allergies  Patient Measurements: Height: 5\' 10"  (177.8 cm) Weight: 168 lb 10.4 oz (76.5 kg) IBW/kg (Calculated) : 73 Heparin Dosing Weight: 73kg  Vital Signs: Temp: 97.7 F (36.5 C) (01/16 1045) Temp Source: Oral (01/16 0700) BP: 105/56 (01/16 1045) Pulse Rate: 87 (01/16 1045)  Labs: Recent Labs    12/01/18 0424 12/02/18 0505 12/03/18 0025  HGB 9.9* 9.7* 9.5*  HCT 32.7* 32.3* 31.5*  PLT 354 369 362  LABPROT 26.5* 21.3* 17.7*  INR 2.48 1.87 1.47  HEPARINUNFRC  --   --  0.26*  CREATININE 0.85 0.83 0.89    Estimated Creatinine Clearance: 92.3 mL/min (by C-G formula based on SCr of 0.89 mg/dL).   Medical History: Past Medical History:  Diagnosis Date  . Cardiomyopathy, unspecified (HCC)   . CHF (congestive heart failure) (HCC)   . Chronic back pain   . Enlarged heart   . Gastric ulcer   . Gastroenteritis   . H/O degenerative disc disease     Assessment: 37 yoM with LVAD admitted with driveline infection. INR was subtherapeutic on admit so heparin started and warfarin held for now in case procedures are needed involving wound.   Warfarin initially resumed 1/7 but now on hold for 3 days with plans for OR today for wound debridement. Discussed with Dr. Donata Clay - ok to resume warfarin tonight and will begin low-dose heparin infusion with no bolus at 1800 tonight with no titrations until tomorrow. INR 1.47, H/H stable, no immediate post/op complications reported.  *PTA Warfarin Dose = 7.5mg  Tues/Thurs/Sat/Sun, 5mg  Mon/Wed/Fri  Goal of Therapy:  INR 2-2.5 Heparin level 0.3-0.5 (lower goal with upcoming OR) Monitor platelets by anticoagulation protocol: Yes   Plan:  -Warfarin 5mg  PO x1 tonight -Start heparin 600 units/hr at 1800 tonight with no bolus -Daily INR, heparin level, CBC, LDH   Fredonia Highland, PharmD, BCPS Clinical  Pharmacist [phone] Please check AMION for all [facility] numbers 12/03/2018

## 2018-12-03 NOTE — Anesthesia Procedure Notes (Signed)
Procedure Name: Intubation Date/Time: 12/03/2018 9:45 AM Performed by: Jearld Pies, CRNA Pre-anesthesia Checklist: Patient identified, Emergency Drugs available, Suction available and Patient being monitored Patient Re-evaluated:Patient Re-evaluated prior to induction Oxygen Delivery Method: Circle System Utilized Preoxygenation: Pre-oxygenation with 100% oxygen Induction Type: IV induction Ventilation: Mask ventilation without difficulty Laryngoscope Size: Mac and 4 Grade View: Grade III Tube type: Oral Tube size: 7.5 mm Number of attempts: 1 Airway Equipment and Method: Stylet and Oral airway Placement Confirmation: ETT inserted through vocal cords under direct vision,  positive ETCO2 and breath sounds checked- equal and bilateral Secured at: 21 cm Tube secured with: Tape Dental Injury: Teeth and Oropharynx as per pre-operative assessment

## 2018-12-03 NOTE — Progress Notes (Signed)
ANTICOAGULATION CONSULT NOTE - Follow Up Consult  Pharmacy Consult for heparin Indication: LVAD  Labs: Recent Labs    11/30/18 0523 12/01/18 0424 12/02/18 0505 12/03/18 0025  HGB 9.9* 9.9* 9.7* 9.5*  HCT 32.7* 32.7* 32.3* 31.5*  PLT 365 354 369 362  LABPROT 26.6* 26.5* 21.3* 17.7*  INR 2.49 2.48 1.87 1.47  HEPARINUNFRC  --   --   --  0.26*  CREATININE 0.87 0.85 0.83  --     Assessment/Plan:  57yo male slightly subtherapeutic on heparin though likely to continue to accumulate with plan to hold heparin on call to OR this am. Will continue gtt at current rate until off this am.   Vernard Gambles, PharmD, BCPS  12/03/2018,1:09 AM

## 2018-12-03 NOTE — Plan of Care (Signed)
Patient is progressing toward all care goals.  Patient has planned debridement of driveline wound site later this AM.  Dressing changes have been completed regularly by VAD Coordinators up to this point.  Dressing remains C/D/I this evening.  Patient is now NPO for procedure; however, prior this evening, patient was quite non-compliant with his 2Gm Sodium Diet, requesting and consuming regular potato chips and a fast food fried chicken dinner brought in by his wife.  Patient encouraged to be compliant.  IV Heparin gtt remains at 1300 units/hr until patient proceeds to procedure this AM.  LVAD parameters within his functional limit settings.  Will continue to monitor.

## 2018-12-03 NOTE — Progress Notes (Signed)
Pre Procedure note for inpatients:   LD EPLIN has been scheduled for Procedure(s): WOUND DEBRIDEMENT WITH A-CELL (N/A) APPLICATION OF WOUND VAC (N/A) today. The various methods of treatment have been discussed with the patient. After consideration of the risks, benefits and treatment options the patient has consented to the planned procedure.   The patient has been seen and labs reviewed. There are no changes in the patient's condition to prevent proceeding with the planned procedure today.  Recent labs:  Lab Results  Component Value Date   WBC 7.6 12/03/2018   HGB 9.5 (L) 12/03/2018   HCT 31.5 (L) 12/03/2018   PLT 362 12/03/2018   GLUCOSE 138 (H) 12/03/2018   CHOL 97 07/21/2018   TRIG 39 07/21/2018   HDL 32 (L) 07/21/2018   LDLCALC 57 07/21/2018   ALT 12 10/28/2018   AST 22 10/28/2018   NA 139 12/03/2018   K 3.9 12/03/2018   CL 108 12/03/2018   CREATININE 0.89 12/03/2018   BUN 8 12/03/2018   CO2 24 12/03/2018   TSH 0.880 07/24/2018   INR 1.47 12/03/2018   HGBA1C 6.2 (H) 08/03/2018    [name], MD 12/03/2018 8:58 AM

## 2018-12-03 NOTE — Progress Notes (Signed)
Pt left unit for operating theatre  Accompanied by nurse and transporter, with equipment.

## 2018-12-03 NOTE — Transfer of Care (Signed)
Immediate Anesthesia Transfer of Care Note  Patient: Theodore Welch  Procedure(s) Performed: WOUND DEBRIDEMENT WITH A-CELL (N/A ) APPLICATION OF WOUND VAC (N/A )  Patient Location: PACU  Anesthesia Type:General  Level of Consciousness: awake, alert  and oriented  Airway & Oxygen Therapy: Patient Spontanous Breathing  Post-op Assessment: Report given to RN and Post -op Vital signs reviewed and stable  Post vital signs: Reviewed and stable  Last Vitals:  Vitals Value Taken Time  BP 105/56   Temp    Pulse 90   Resp 14   SpO2 98     Last Pain:  Vitals:   12/03/18 0700  TempSrc: Oral  PainSc:      Sarah RN at bedside: LVAD values: Flow 4.7 Speed 5700 PI 3.1   Patients Stated Pain Goal: 0 (11/30/18 1100)  Complications: No apparent anesthesia complications

## 2018-12-03 NOTE — Progress Notes (Signed)
VAD Coordinator Procedure Note:   VAD Coordinator met patient in Short Stay 70. Pt undergoing wound debridement of drive line and periumbilical counterincision  per Dr. Prescott Gum. Hemodynamics and VAD parameters monitored by myself and anesthesia throughout the procedure. Blood pressures were obtained with automatic cuff on left arm.    Time: Doppler Auto  BP Flow PI Power Speed  Pre-procedure:  0900  109/67(82) 4.8 3.1 4.4 5700                    Secation Induction: 0940  132/111(1119) 4.1 7.7 4.5 5700   0945  80/66(73) 4.8 2.7 4.3 5700   1000  107/56(70) 4.9 3.0 4.4 5700   1015  71/56(61) 4.5 2.8 4.2 5700   1030  96/64(76) 4.6 2.9 4.2 5700  Recovery Area: 1045  105/56(68) 4.7 3.3 4.3 5700             Patient tolerated the procedure well. VAD Coordinator accompanied and remained with patient in recovery area. Wound VAC was placed to driveline area at -929.   Patient Disposition: Pt transferred to [facility] and handoff given to bedside nurse.  Tanda Rockers RN, BSN VAD Coordinator 24/7 Pager [phone]

## 2018-12-04 ENCOUNTER — Ambulatory Visit (HOSPITAL_COMMUNITY): Payer: Medicare Other

## 2018-12-04 ENCOUNTER — Encounter (HOSPITAL_COMMUNITY): Payer: Self-pay | Admitting: Cardiothoracic Surgery

## 2018-12-04 LAB — GLUCOSE, CAPILLARY
GLUCOSE-CAPILLARY: 92 mg/dL (ref 70–99)
Glucose-Capillary: 190 mg/dL — ABNORMAL HIGH (ref 70–99)
Glucose-Capillary: 147 mg/dL — ABNORMAL HIGH (ref 70–99)
Glucose-Capillary: 180 mg/dL — ABNORMAL HIGH (ref 70–99)

## 2018-12-04 LAB — BASIC METABOLIC PANEL
Anion gap: 9 (ref 5–15)
BUN: 14 mg/dL (ref 6–20)
CO2: 25 mmol/L (ref 22–32)
Calcium: 9.7 mg/dL (ref 8.9–10.3)
Chloride: 105 mmol/L (ref 98–111)
Creatinine, Ser: 0.88 mg/dL (ref 0.61–1.24)
GFR calc Af Amer: >60 mL/min (ref >60)
GFR calc non Af Amer: >60 mL/min (ref >60)
Glucose, Bld: 108 mg/dL — ABNORMAL HIGH (ref 70–99)
Potassium: 4 mmol/L (ref 3.5–5.1)
Sodium: 139 mmol/L (ref 135–145)

## 2018-12-04 LAB — PROTIME-INR
INR: 1.13
Prothrombin Time: 14.4 seconds (ref 11.4–15.2)

## 2018-12-04 LAB — CBC
HCT: 33 % — ABNORMAL LOW (ref 39.0–52.0)
Hemoglobin: 10 g/dL — ABNORMAL LOW (ref 13.0–17.0)
MCH: 24.3 pg — ABNORMAL LOW (ref 26.0–34.0)
MCHC: 30.3 g/dL (ref 30.0–36.0)
MCV: 80.1 fL (ref 80.0–100.0)
Platelets: 410 10*3/uL — ABNORMAL HIGH (ref 150–400)
RBC: 4.12 MIL/uL — ABNORMAL LOW (ref 4.22–5.81)
RDW: 15.9 % — ABNORMAL HIGH (ref 11.5–15.5)
WBC: 17.4 10*3/uL — ABNORMAL HIGH (ref 4.0–10.5)
nRBC: 0 % (ref 0.0–0.2)

## 2018-12-04 LAB — LACTATE DEHYDROGENASE: LDH: 183 U/L (ref 98–192)

## 2018-12-04 LAB — HEPARIN LEVEL (UNFRACTIONATED): Heparin Unfractionated: <0.1 IU/mL — ABNORMAL LOW (ref 0.30–0.70)

## 2018-12-04 MED ORDER — WARFARIN SODIUM 7.5 MG PO TABS
7.5000 mg | ORAL_TABLET | Freq: Once | ORAL | Status: AC
  Administered 2018-12-04: 7.5 mg via ORAL
  Filled 2018-12-04: qty 1

## 2018-12-04 NOTE — Progress Notes (Addendum)
ANTICOAGULATION CONSULT NOTE - Follow-Up Consult  Pharmacy Consult for heparin + warfarin Indication: LVAD  No Known Allergies  Patient Measurements: Height: 5\' 10"  (177.8 cm) Weight: 167 lb 15.9 oz (76.2 kg) IBW/kg (Calculated) : 73 Heparin Dosing Weight: 73kg  Vital Signs: Temp: 98.1 F (36.7 C) (01/17 0231) Temp Source: Oral (01/17 0231) BP: 113/89 (01/17 0231) Pulse Rate: 92 (01/17 0231)  Labs: Recent Labs    12/02/18 0505 12/03/18 0025 12/04/18 0617  HGB 9.7* 9.5* 10.0*  HCT 32.3* 31.5* 33.0*  PLT 369 362 410*  LABPROT 21.3* 17.7* 14.4  INR 1.87 1.47 1.13  HEPARINUNFRC  --  0.26* <0.10*  CREATININE 0.83 0.89 0.88    Estimated Creatinine Clearance: 93.3 mL/min (by C-G formula based on SCr of 0.88 mg/dL).   Medical History: Past Medical History:  Diagnosis Date  . Cardiomyopathy, unspecified (HCC)   . CHF (congestive heart failure) (HCC)   . Chronic back pain   . Enlarged heart   . Gastric ulcer   . Gastroenteritis   . H/O degenerative disc disease     Assessment: 77 yoM with LVAD admitted with driveline infection. INR was subtherapeutic on admit so heparin started and warfarin held for now in case procedures are needed involving wound.   Warfarin initially resumed 1/7 but then held for OR 1/16 for drive line wound debridement. Discussed with Dr. Donata Clay - ok to resume warfarin 1/16 post op and will begin low-dose heparin infusion with no bolus - HL 1/17 < 0.1 and plans were to increase heparin to therapuetic dose with goal 0.3-0.4 - but oozing around driveline site this am Will continue low dose heparin for now and reassess bleeding this afternoon.  H/H stable,   *PTA Warfarin Dose = 7.5mg  Tues/Thurs/Sat/Sun, 5mg  Mon/Wed/Fri  Goal of Therapy:  INR 2-2.5 Heparin level 0.3-0.4 (lower goal with upcoming OR) Monitor platelets by anticoagulation protocol: Yes   Plan:  -Warfarin 7.5mg  PO x1 tonight -continue  heparin 600 units/hr - recheck bleeding  this afternoon and increase if improved -Daily INR, heparin level, CBC, LDH   [name] Pharm.D. CPP, BCPS Clinical Pharmacist [phone] 12/04/2018 11:35 AM

## 2018-12-04 NOTE — Care Management Important Message (Signed)
Important Message  Patient Details  Name: Theodore Welch MRN: [id number] Date of Birth: 1961/02/27   Medicare Important Message Given:  Yes    Georgio Hattabaugh P Kristyanna Barcelo 12/04/2018, 4:43 PM

## 2018-12-04 NOTE — Progress Notes (Signed)
1 Day Post-Op Procedure(s) (LRB): WOUND DEBRIDEMENT WITH A-CELL (N/A) APPLICATION OF WOUND VAC (N/A) Subjective: Patient with bleeding from surgical site this morning with 200 cc in VAC canister of serosanguineous fluid. Patient should need no further excisional debridement so Coumadin can be resumed.  Low-dose heparin bridging titrated to the clinical bleeding will be needed. Objective: Vital signs in last 24 hours: Temp:  [97.9 F (36.6 C)-98.2 F (36.8 C)] 98.2 F (36.8 C) (01/17 1608) Pulse Rate:  [87-102] 92 (01/17 0231) Cardiac Rhythm: Normal sinus rhythm (01/17 1608) Resp:  [15-27] 23 (01/17 1608) BP: (86-117)/(53-96) 117/53 (01/17 1608) SpO2:  [97 %-99 %] 97 % (01/17 1608) Weight:  [76.2 kg] 76.2 kg (01/17 0231)  Hemodynamic parameters for last 24 hours:  Stable  Intake/Output from previous day: 01/16 0701 - 01/17 0700 In: 938.1 [I.V.:438.1; IV Piggyback:500] Out: 1650 [Urine:1575; Drains:50; Blood:25] Intake/Output this shift: Total I/O In: 385.3 [P.O.:240; I.V.:45.3; IV Piggyback:100] Out: 2350 [Urine:2350]  VAC sponge compressed Normal VAD hum  Lab Results: Recent Labs    12/03/18 0025 12/04/18 0617  WBC 7.6 17.4*  HGB 9.5* 10.0*  HCT 31.5* 33.0*  PLT 362 410*   BMET:  Recent Labs    12/03/18 0025 12/04/18 0617  NA 139 139  K 3.9 4.0  CL 108 105  CO2 24 25  GLUCOSE 138* 108*  BUN 8 14  CREATININE 0.89 0.88  CALCIUM 8.9 9.7    PT/INR:  Recent Labs    12/04/18 0617  LABPROT 14.4  INR 1.13   ABG    Component Value Date/Time   PHART 7.473 (H) 08/05/2018 0345   HCO3 26.9 08/05/2018 0345   TCO2 26 08/04/2018 1622   O2SAT 73.6 08/12/2018 0345   CBG (last 3)  Recent Labs    12/04/18 0849 12/04/18 1135 12/04/18 1612  GLUCAP 190* 92 147*    Assessment/Plan: S/P Procedure(s) (LRB): WOUND DEBRIDEMENT WITH A-CELL (N/A) APPLICATION OF WOUND VAC (N/A) Continue Coumadin reloading and wound VAC therapy.  Plan wound VAC sponge change in  OR in 1 week   LOS: 11 days    [name] 12/04/2018

## 2018-12-04 NOTE — Plan of Care (Signed)
Discussed plan of care re bleeding and heparin restart.

## 2018-12-04 NOTE — Progress Notes (Signed)
Patient ID: Theodore BaarsBilly E Welch, male   DOB: 05/07/1961, 58 y.o.   MRN: [id number]   Advanced Heart Failure VAD Team Note  PCP-Cardiologist: No primary care provider on file.   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 Initially on  vancomycin and cefepime. Antibiotics narrowed to cefazolin. Finished 5 day course of diflucan for associated fungal rash.   To OR 12/03/2018 for debridement and wound vac. Wound culture sent, pending.   Feeling OK this am. Denies SOB, lightheadedness, or dizziness.  He has bleeding this morning at wound vac site, appears to track along driveline.   Wound CX- Staph aureus -->MSSA Blood CX- NGTD.    Hgb stable 10. WBC 17. INR 1.13.  Coumadin restarted but heparin gtt still at low dose.   LVAD Interrogation HM 3: Speed: 5700 Flow: 4.4 PI: 4.0 Power: 4.4.   Objective:    Vital Signs:   Temp:  [96.8 F (36 C)-98.1 F (36.7 C)] 98.1 F (36.7 C) (01/17 0231) Pulse Rate:  [86-102] 92 (01/17 0231) Resp:  [13-23] 23 (01/17 0231) BP: (86-126)/(56-96) 113/89 (01/17 0231) SpO2:  [97 %-99 %] 99 % (01/17 0231) Weight:  [76.2 kg-76.5 kg] 76.2 kg (01/17 0231) Last BM Date: 12/02/18   Mean arterial Pressure 80s-90s  Intake/Output:   Intake/Output Summary (Last 24 hours) at 12/04/2018 0804 Last data filed at 12/04/2018 0600 Gross per 24 hour  Intake 938.1 ml  Output 1650 ml  Net -711.9 ml     Physical Exam   General: Well appearing this am. NAD.  HEENT: Normal. Neck: Supple, JVP 7-8 cm. Carotids OK.  Cardiac:  Mechanical heart sounds with LVAD hum present.  Lungs:  CTAB, normal effort.  Abdomen:  NT, ND, no HSM. No bruits or masses. +BS  LVAD exit site: Wound vac present, there is some bleeding tracking along driveline.  Extremities:  Warm and dry. No cyanosis, clubbing, rash, or edema.  Neuro:  Alert & oriented x 3. Cranial nerves grossly intact. Moves all 4 extremities w/o difficulty. Affect pleasant      Telemetry   NSR 70-80s, personally reviewed.   EKG    No new tracings.    Labs   Basic Metabolic Panel: Recent Labs  Lab 11/30/18 0523 12/01/18 0424 12/02/18 0505 12/03/18 0025 12/04/18 0617  NA 142 142 142 139 139  K 4.1 4.0 3.8 3.9 4.0  CL 106 107 108 108 105  CO2 29 31 28 24 25   GLUCOSE 85 90 112* 138* 108*  BUN 8 7 8 8 14   CREATININE 0.87 0.85 0.83 0.89 0.88  CALCIUM 9.1 9.0 9.0 8.9 9.7    Liver Function Tests: No results for input(s): AST, ALT, ALKPHOS, BILITOT, PROT, ALBUMIN in the last 168 hours. No results for input(s): LIPASE, AMYLASE in the last 168 hours. No results for input(s): AMMONIA in the last 168 hours.  CBC: Recent Labs  Lab 11/30/18 0523 12/01/18 0424 12/02/18 0505 12/03/18 0025 12/04/18 0617  WBC 8.0 8.0 7.8 7.6 17.4*  HGB 9.9* 9.9* 9.7* 9.5* 10.0*  HCT 32.7* 32.7* 32.3* 31.5* 33.0*  MCV 82.4 82.8 82.0 80.4 80.1  PLT 365 354 369 362 410*    INR: Recent Labs  Lab 11/30/18 0523 12/01/18 0424 12/02/18 0505 12/03/18 0025 12/04/18 0617  INR 2.49 2.48 1.87 1.47 1.13    Other results:  EKG:    Imaging   No results found.   Medications:     Scheduled  Medications: . amLODipine  5 mg Oral Daily  . aspirin EC  81 mg Oral Daily  . atorvastatin  20 mg Oral Daily  . carbamide peroxide  5 drop Both EARS TID  . DULoxetine  60 mg Oral Daily  . gabapentin  600 mg Oral TID  . insulin aspart  0-9 Units Subcutaneous TID WC  . metFORMIN  500 mg Oral BID WC  . nystatin   Topical BID  . pantoprazole  40 mg Oral Daily  . sacubitril-valsartan  1 tablet Oral BID  . sodium chloride flush  10-40 mL Intracatheter Q12H  . spironolactone  25 mg Oral Daily  . thiamine  100 mg Oral Daily  . vitamin B-12  1,000 mcg Oral Daily  . Warfarin - Pharmacist Dosing Inpatient   Does not apply q1800    Infusions: . sodium chloride Stopped (11/23/18 1702)  .  ceFAZolin (ANCEF) IV 2 g (12/04/18 0225)  . heparin 600 Units/hr (12/04/18 0350)  .  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  PICC line placed 11/26/2018. Transitioned to cefazolin per ID, will need prolonged course to heal.  Completed 5 day course of fluconazole on 11/28/18 for surrounding fungal infection.  CT with small amount of inflammation and fluid along the driveline. Blood CX NGTD.  WBCs higher today post-driveline site debridement on 1/16. Now with bleeding at wound vac site.  - Referred to [facility] for home antibiotics. Will continue for 24 days total from 11/26/2018. - Dr. Donata Clay and LVAD nurse coordinators to assess wound vac site, hopefully can get by with reinforcing it.   - He continues on low dose heparin.  Now that he has bleeding at wound vac site, will discuss timing of increasing heparin gtt with Dr. Donata Clay.  2. Chronic systolic CHF s/p HM-III VAD: Nonischemic cardiomyopathy, now s/p Heartmate 3 LVAD in 9/19. VAD interrogated personally. Parameters stable.  Volume status stable off po lasix. MAP mildly elevated at times.  - Continue spironolactone25mg  daily.  -ContinueEntresto 97/103 mg BID.  - Continue amlodipine 5 mg daily. - Continue ASA 81 daily.  - Warfarin restarted for INR 2-2.5.  Dosing of heparin gtt per Dr. Donata Clay for now with bleeding at wound vac site.   3. Smoking: Still smoking a few cigs/day as outpatient. Using nicotine patches. Knows he needs to quit to be a transplant candidate. Encouraged cessation.  4. H/o RLE DVT:   Warfarin/heparin.  5. OSA: Continue CPAP. No change.  6. Type II diabetes: Continue sliding scale while inpatient. No change.    I reviewed the LVAD parameters from today, and compared the results to the patient's prior recorded data.  No programming changes were made.  The LVAD is functioning within specified parameters.  The patient performs LVAD self-test daily.  LVAD interrogation was negative for any significant power changes, alarms or PI events/speed drops.  LVAD equipment check completed and is in good working order.  Back-up equipment present.   LVAD education done on emergency procedures and precautions and reviewed exit site care.   Length of Stay: 11    Shirlee Latch, MD 12/04/2018, 8:04 AM  VAD Team --- VAD ISSUES ONLY--- Pager [phone] (7am - 7am)  Advanced Heart Failure Team  Pager [phone] (M-F; 7a - 4p)  Please contact [facility] for night-coverage after hours (4p -7a ) and weekends on [email or web address]

## 2018-12-04 NOTE — Progress Notes (Signed)
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 Shirlee Latch.   HM III LVAD implanted on 08/03/18 by Dr. Laneta Simmers under Destination Therapy criteria due to smoking status.  Received page from bedside nurse this morning stating that the pt has had a lot of bleeding under wound VAC. Upon assessment, there is a moderate amount of blood pooling under tegaderm. Heparin is on hold per PVT and nurse has placed order for wound vac supplies.  Vital signs: Temp:  98.2 HR: 95 Doppler Pressure:  94 Automatic BP:  114/86 (92) O2 Sat: 99% on RA Wt:  157>160>159.6>162.0>167.6>169.3>168.0>167.9 lbs   LVAD interrogation reveals:  Speed: 5700 Flow: 4.4 Power:  4.4w PI: 3.6 Alarms: none Events:  5-10 daily  Hematocrit: 32  Fixed speed: 5700 Low speed limit:  5400   Drive Line site/Periumbilical counterincision site: wound VAC in placed -125. Since above note, bedside nurse has changed. CDI.  Labs:  LDH trend: 165>127>128>120>140>162>172>193>180>183  INR trend: 1.56>1.61>1.70>1.83>2.05>2.49>2.48>2.25>1.47>1.13  WBC: 7.6>17.4  Anticoagulation Plan: -INR Goal:  2.0 - 2.5  -ASA Dose:  81 mg daily - Coumadin re-started 11/24/18  Device: N/A - no ICD  Infection:  - BC's x 2 11/23/18>>NTD - LVAD Wound culture 11/24/18>>staphylococcus aureus   Plan/Recommendations:  1. Contact VAD Coordinator for any VAD equipment or driveline issues.  [name] RN VAD Coordinator  Office: [phone]  24/7 Pager: [phone]

## 2018-12-04 NOTE — Progress Notes (Addendum)
Patient upset that he had to be stuck for heparin level this A.M. I tried to explain to him the reason. The beginning of the shift he wanted me to disconnect his IV I explained I couldn't because he had a heparin drip. He took his wound VAC attached to the pole and left the floor for about 15 minutes. This morning he left the floor again . He returned back to the floor and upon assessment of wound vac to drive line it was found to be bleeding. LVAD coordinator Huntley Dec was notified and Dr.Mclean. Site was reinforced until Dr. Donata Clay can assess. No alarms on wound vac machine it continues to have continuous suction. Day RN aware and taking over the care.

## 2018-12-04 NOTE — Progress Notes (Signed)
Initial Nutrition Assessment  DOCUMENTATION CODES:   Not applicable  INTERVENTION:   -Ensure Enlive po BID, each supplement provides 350 kcal and 20 grams of protein -MVI with minerals daily  NUTRITION DIAGNOSIS:   Increased nutrient needs related to post-op healing as evidenced by estimated needs.  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 Pt admitted with driveline infection.   Pt admitted with LVAD driveline infection.  1/8- CT scan showed indurated mid abdomen soft tissue without abscess 1/9- s/p PICC placement 1/16- s/p wound debridement with wound vac placement  Reviewed I/O's: -712 ml x 24 hours and +5.8 L since admission  Drain output: 50 ml x 24 hours  RD drawn to pt chart secondary to LOS and wound vac placement.   Spoke with pt, who is in good spirits today; he reports he is ready to take a walk. Pt reports good appetite; PTA he consumes 3-4 meals per day, which usually consist of a meat, starch, and vegetable. Pt reports he has been very vigilant about his sodium intake; he does not add salt to food and reads labels when purchasing foods. He has been consuming 75-100% of meals during hospitalization- he reports he is happy to now be on a regular diet.   Pt denies any recent wt loss, however, endorses weight loss prior to LVAD. Since LVAD, pt has been working on regaining strength and improving nutritional status. He shares that he was also in cardiac rehab, which assisted him greatly.   Discussed importance of good meal and supplement intake to promote healing. He is amenable to Ensure supplements, which he was taking sporadically at home.   Last Hgb A1c: 6.2 (08/04/19). PTA DM medications are 500  mg metformin BID. Pt reports good glycemic control at home and able to verbalize DM diet and self-management practices.   Labs reviewed: CBGS: 92-221 (inpatient orders for glycemic control are 500 mg metformin XR BID and 0-9 units insulin aspart TID with meals).   NUTRITION - FOCUSED PHYSICAL EXAM:    Most Recent Value  Orbital Region  Mild depletion  Upper Arm Region  No depletion  Thoracic and Lumbar Region  No depletion  Buccal Region  No depletion  Temple Region  Mild depletion  Clavicle Bone Region  Mild depletion  Clavicle and Acromion Bone Region  No depletion  Scapular Bone Region  No depletion  Dorsal Hand  No depletion  Patellar Region  No depletion  Anterior Thigh Region  No depletion  Posterior Calf Region  No depletion  Edema (RD Assessment)  Mild  Hair  Reviewed  Eyes  Reviewed  Mouth  Reviewed  Skin  Reviewed  Nails  Reviewed       Diet Order:   Diet Order            Diet regular Room service appropriate? Yes; Fluid consistency: Thin  Diet effective now              EDUCATION NEEDS:   Education needs have been addressed  Skin:  Skin Assessment: Skin Integrity Issues: Skin Integrity Issues:: Wound VAC Wound Vac: lt abdomen  Last BM:  12/03/18  Height:   Ht Readings from Last 1 Encounters:  12/03/18 5\' 10"  (1.778 m)    Weight:   Wt Readings from Last 1 Encounters:  12/04/18 76.2 kg    Ideal Body Weight:  75.5 kg  BMI:  Body mass index is 24.1 kg/m.  Estimated Nutritional Needs:   Kcal:  2200-2400  Protein:  115-130 grams  Fluid:  >2.2 L    Ovide Dusek A. Mayford Knife, RD, LDN, CDE Pager: [phone] After hours Pager: [phone]

## 2018-12-05 LAB — BASIC METABOLIC PANEL
Anion gap: 9 (ref 5–15)
BUN: 12 mg/dL (ref 6–20)
CO2: 25 mmol/L (ref 22–32)
Calcium: 9.2 mg/dL (ref 8.9–10.3)
Chloride: 108 mmol/L (ref 98–111)
Creatinine, Ser: 0.94 mg/dL (ref 0.61–1.24)
GFR calc Af Amer: >60 mL/min (ref >60)
GFR calc non Af Amer: >60 mL/min (ref >60)
Glucose, Bld: 119 mg/dL — ABNORMAL HIGH (ref 70–99)
Potassium: 3.9 mmol/L (ref 3.5–5.1)
Sodium: 142 mmol/L (ref 135–145)

## 2018-12-05 LAB — CBC
HCT: 32.5 % — ABNORMAL LOW (ref 39.0–52.0)
Hemoglobin: 9.6 g/dL — ABNORMAL LOW (ref 13.0–17.0)
MCH: 24.1 pg — ABNORMAL LOW (ref 26.0–34.0)
MCHC: 29.5 g/dL — ABNORMAL LOW (ref 30.0–36.0)
MCV: 81.5 fL (ref 80.0–100.0)
Platelets: 375 10*3/uL (ref 150–400)
RBC: 3.99 MIL/uL — ABNORMAL LOW (ref 4.22–5.81)
RDW: 16.3 % — ABNORMAL HIGH (ref 11.5–15.5)
WBC: 8.8 10*3/uL (ref 4.0–10.5)
nRBC: 0 % (ref 0.0–0.2)

## 2018-12-05 LAB — GLUCOSE, CAPILLARY
Glucose-Capillary: 94 mg/dL (ref 70–99)
Glucose-Capillary: 68 mg/dL — ABNORMAL LOW (ref 70–99)
Glucose-Capillary: 133 mg/dL — ABNORMAL HIGH (ref 70–99)
Glucose-Capillary: 144 mg/dL — ABNORMAL HIGH (ref 70–99)

## 2018-12-05 LAB — PROTIME-INR
INR: 1.29
Prothrombin Time: 15.9 seconds — ABNORMAL HIGH (ref 11.4–15.2)

## 2018-12-05 LAB — HEPARIN LEVEL (UNFRACTIONATED): Heparin Unfractionated: <0.1 IU/mL — ABNORMAL LOW (ref 0.30–0.70)

## 2018-12-05 LAB — LACTATE DEHYDROGENASE: LDH: 150 U/L (ref 98–192)

## 2018-12-05 MED ORDER — WARFARIN SODIUM 7.5 MG PO TABS
7.5000 mg | ORAL_TABLET | Freq: Once | ORAL | Status: AC
  Administered 2018-12-05: 7.5 mg via ORAL
  Filled 2018-12-05: qty 1

## 2018-12-05 NOTE — Progress Notes (Signed)
ANTICOAGULATION CONSULT NOTE - Follow-Up Consult  Pharmacy Consult for heparin + warfarin Indication: LVAD  No Known Allergies  Patient Measurements: Height: 5\' 10"  (177.8 cm) Weight: 164 lb 3.9 oz (74.5 kg) IBW/kg (Calculated) : 73 Heparin Dosing Weight: 73kg  Vital Signs: Temp: 98.4 F (36.9 C) (01/18 0804) Temp Source: Oral (01/18 0804) BP: 97/80 (01/18 0804) Pulse Rate: 84 (01/18 0804)  Labs: Recent Labs    12/03/18 0025 12/04/18 0617 12/05/18 0251  HGB 9.5* 10.0* 9.6*  HCT 31.5* 33.0* 32.5*  PLT 362 410* 375  LABPROT 17.7* 14.4 15.9*  INR 1.47 1.13 1.29  HEPARINUNFRC 0.26* <0.10* <0.10*  CREATININE 0.89 0.88 0.94    Estimated Creatinine Clearance: 87.4 mL/min (by C-G formula based on SCr of 0.94 mg/dL).   Medical History: Past Medical History:  Diagnosis Date  . Cardiomyopathy, unspecified (HCC)   . CHF (congestive heart failure) (HCC)   . Chronic back pain   . Enlarged heart   . Gastric ulcer   . Gastroenteritis   . H/O degenerative disc disease     Assessment: 79 yoM with LVAD admitted with driveline infection. INR was subtherapeutic on admit so heparin started and warfarin held for now in case procedures are needed involving wound.   Warfarin initially resumed 1/7 but then held for OR 1/16 for drive line wound debridement. Discussed with Dr. Donata Clay - ok to resume warfarin 1/16 post op and will begin low-dose heparin infusion with no bolus - HL 1/17 < 0.1 and plans were to increase heparin to therapuetic dose with goal 0.3-0.4 - but oozing around driveline site.   Heparin level undetectable this morning, bleeding stable/resolved today. D/w cardiology will increase heparin slightly to 700 units/hr. Will continue to be conservative for the next few days.   *PTA Warfarin Dose = 7.5mg  Tues/Thurs/Sat/Sun, 5mg  Mon/Wed/Fri  Goal of Therapy:  INR 2-2.5 Heparin level 0.3-0.4 (lower goal with upcoming OR) Monitor platelets by anticoagulation protocol:  Yes   Plan:  -Repeat Warfarin 7.5mg  PO x1 tonight -Increase IV heparin 700 units/hr  -Daily INR, heparin level, CBC, LDH  Sheppard Coil PharmD., BCPS Clinical Pharmacist 12/05/2018 8:23 AM

## 2018-12-05 NOTE — Progress Notes (Signed)
Pt wandered off unit without RN knowledge, pt telemetry not showing.

## 2018-12-05 NOTE — Progress Notes (Signed)
Patient ID: Theodore BaarsBilly E Goddard, male   DOB: 03/02/1961, 58 y.o.   MRN: [id number]   Advanced Heart Failure VAD Team Note  PCP-Cardiologist: No primary care provider on file.   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 Initially on  vancomycin and cefepime. Antibiotics narrowed to cefazolin. Finished 5 day course of diflucan for associated fungal rash.   To OR 12/03/2018 for debridement and wound vac. Wound culture sent, pending.   Feeling ok. Walking halls. No CP or SOB. On warfarin and heparin. Had bleeding from wound vac site yesterday. Wound vac adjusted and reinforced. Now no bleeding. No fevers or chills. INR 1.29  Wound CX- Staph aureus -->MSSA Blood CX- NGTD.    LVAD Interrogation HM 3: Speed: 5700 Flow: 4.4 PI: 4.0 Power: 4.4.   Objective:    Vital Signs:   Temp:  [97.1 F (36.2 C)-98.4 F (36.9 C)] 98.4 F (36.9 C) (01/18 0804) Pulse Rate:  [81-97] 84 (01/18 0804) Resp:  [18-28] 22 (01/18 0804) BP: (77-117)/(53-86) 97/80 (01/18 0804) SpO2:  [96 %-99 %] 97 % (01/18 0305) Weight:  [74.5 kg] 74.5 kg (01/18 0604) Last BM Date: 12/02/18   Mean arterial Pressure 80s Intake/Output:   Intake/Output Summary (Last 24 hours) at 12/05/2018 0940 Last data filed at 12/05/2018 0604 Gross per 24 hour  Intake 183.66 ml  Output 3975 ml  Net -3791.34 ml     Physical Exam   General:  NAD. Sitting up on side of bed eating breakfast HEENT: normal  Neck: supple. JVP not elevated.  Carotids 2+ bilat; no bruits. No lymphadenopathy or thryomegaly appreciated. Cor: LVAD hum.  Lungs: Clear. Abdomen: obese soft, nontender, non-distended. No hepatosplenomegaly. No bruits or masses. Good bowel sounds. Driveline site looks ok. Dressing in place. Small hematoma. Wound vac in place.  Extremities: no cyanosis, clubbing, rash. Warm no edema  Neuro: alert & oriented x 3. No focal deficits. Moves all 4 without problem    Telemetry   NSR 80s,  personally reviewed.   EKG    No new tracings.    Labs   Basic Metabolic Panel: Recent Labs  Lab 12/01/18 0424 12/02/18 0505 12/03/18 0025 12/04/18 0617 12/05/18 0251  NA 142 142 139 139 142  K 4.0 3.8 3.9 4.0 3.9  CL 107 108 108 105 108  CO2 31 28 24 25 25   GLUCOSE 90 112* 138* 108* 119*  BUN 7 8 8 14 12   CREATININE 0.85 0.83 0.89 0.88 0.94  CALCIUM 9.0 9.0 8.9 9.7 9.2    Liver Function Tests: No results for input(s): AST, ALT, ALKPHOS, BILITOT, PROT, ALBUMIN in the last 168 hours. No results for input(s): LIPASE, AMYLASE in the last 168 hours. No results for input(s): AMMONIA in the last 168 hours.  CBC: Recent Labs  Lab 12/01/18 0424 12/02/18 0505 12/03/18 0025 12/04/18 0617 12/05/18 0251  WBC 8.0 7.8 7.6 17.4* 8.8  HGB 9.9* 9.7* 9.5* 10.0* 9.6*  HCT 32.7* 32.3* 31.5* 33.0* 32.5*  MCV 82.8 82.0 80.4 80.1 81.5  PLT 354 369 362 410* 375    INR: Recent Labs  Lab 12/01/18 0424 12/02/18 0505 12/03/18 0025 12/04/18 0617 12/05/18 0251  INR 2.48 1.87 1.47 1.13 1.29    Other results:  EKG:    Imaging   No results found.   Medications:     Scheduled Medications: . amLODipine  5 mg Oral Daily  . aspirin EC  81 mg Oral Daily  .  atorvastatin  20 mg Oral Daily  . carbamide peroxide  5 drop Both EARS TID  . DULoxetine  60 mg Oral Daily  . gabapentin  600 mg Oral TID  . insulin aspart  0-9 Units Subcutaneous TID WC  . metFORMIN  500 mg Oral BID WC  . nystatin   Topical BID  . pantoprazole  40 mg Oral Daily  . sacubitril-valsartan  1 tablet Oral BID  . sodium chloride flush  10-40 mL Intracatheter Q12H  . spironolactone  25 mg Oral Daily  . thiamine  100 mg Oral Daily  . vitamin B-12  1,000 mcg Oral Daily  . warfarin  7.5 mg Oral ONCE-1800  . Warfarin - Pharmacist Dosing Inpatient   Does not apply q1800    Infusions: . sodium chloride Stopped (11/23/18 1702)  .  ceFAZolin (ANCEF) IV 2 g (12/05/18 0234)  . heparin 600 Units/hr (12/04/18  2014)  . 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  PICC line placed 11/26/2018. Transitioned to cefazolin per ID, will need prolonged course to heal.  Completed 5 day course of fluconazole on 11/28/18 for surrounding fungal infection.  CT with small amount of inflammation and fluid along the driveline. Blood CX NGTD.   - Referred to [facility] for home [facility]. Will continue for 24 days total from 11/26/2018. - Dr. Donata Clay and LVAD nurse coordinators to assess wound vac site, hopefully can get by with reinforcing it.   - He continues on low dose heparin.  Now that he has bleeding at wound vac site, will discuss timing of increasing heparin gtt with Dr. Donata Clay.   2. Chronic systolic CHF s/p HM-III VAD: Nonischemic cardiomyopathy, now s/p Heartmate 3 LVAD in 9/19. VAD interrogated personally. Parameters stable.  Volume status stable off po lasix. MAP mildly elevated at times.  - Well compensated with VAD support. Volume status stable.  - Continue spironolactone25mg  daily.  -ContinueEntresto 97/103 mg BID.  - Continue amlodipine 5 mg daily. - Continue ASA 81 daily.  - Warfarin restarted for INR 2-2.5. INR 1.29. On low-dose heparin. Discussed dosing with PharmD personally.Watch for recurrent bleeding at wound vac site - VAD interrogated personally. Parameters stable. - LDH 150 - INR 1.29  3. Smoking: Still smoking a few cigs/day as outpatient. Using nicotine patches. Knows he needs to  quit to be a transplant candidate. Encouraged cessation.  4. H/o RLE DVT:  Warfarin/heparin.  5. OSA: Continue CPAP. No change.  6. Type II diabetes: Continue sliding scale while inpatient. No change.    I reviewed the LVAD parameters from today, and compared the results to the patient's prior recorded data.  No programming changes were made.  The LVAD is functioning within specified parameters.  The patient performs LVAD self-test daily.  LVAD interrogation was negative for any significant power changes, alarms or PI events/speed drops.  LVAD equipment check completed and is in good working order.  Back-up equipment present.   LVAD education done on emergency procedures and precautions and reviewed exit site care.   Length of Stay: 12  Arvilla Meres, MD 12/05/2018, 9:40  AM  VAD Team --- VAD ISSUES ONLY--- Pager [phone] (7am - 7am)  Advanced Heart Failure Team  Pager [phone] (M-F; 7a - 4p)  Please contact [facility] for night-coverage after hours (4p -7a ) and weekends on [email or web address]

## 2018-12-06 LAB — GLUCOSE, CAPILLARY
Glucose-Capillary: 125 mg/dL — ABNORMAL HIGH (ref 70–99)
Glucose-Capillary: 73 mg/dL (ref 70–99)
Glucose-Capillary: 143 mg/dL — ABNORMAL HIGH (ref 70–99)
Glucose-Capillary: 131 mg/dL — ABNORMAL HIGH (ref 70–99)

## 2018-12-06 LAB — PROTIME-INR
INR: 1.49
Prothrombin Time: 17.8 seconds — ABNORMAL HIGH (ref 11.4–15.2)

## 2018-12-06 LAB — HEPARIN LEVEL (UNFRACTIONATED): Heparin Unfractionated: <0.1 IU/mL — ABNORMAL LOW (ref 0.30–0.70)

## 2018-12-06 LAB — BASIC METABOLIC PANEL
Anion gap: 9 (ref 5–15)
BUN: 13 mg/dL (ref 6–20)
CO2: 25 mmol/L (ref 22–32)
Calcium: 9.2 mg/dL (ref 8.9–10.3)
Chloride: 108 mmol/L (ref 98–111)
Creatinine, Ser: 0.89 mg/dL (ref 0.61–1.24)
GFR calc Af Amer: >60 mL/min (ref >60)
GFR calc non Af Amer: >60 mL/min (ref >60)
Glucose, Bld: 129 mg/dL — ABNORMAL HIGH (ref 70–99)
Potassium: 4.1 mmol/L (ref 3.5–5.1)
Sodium: 142 mmol/L (ref 135–145)

## 2018-12-06 LAB — CBC
HCT: 31.6 % — ABNORMAL LOW (ref 39.0–52.0)
Hemoglobin: 9.5 g/dL — ABNORMAL LOW (ref 13.0–17.0)
MCH: 24.4 pg — ABNORMAL LOW (ref 26.0–34.0)
MCHC: 30.1 g/dL (ref 30.0–36.0)
MCV: 81 fL (ref 80.0–100.0)
Platelets: 362 10*3/uL (ref 150–400)
RBC: 3.9 MIL/uL — ABNORMAL LOW (ref 4.22–5.81)
RDW: 16.3 % — ABNORMAL HIGH (ref 11.5–15.5)
WBC: 8.5 10*3/uL (ref 4.0–10.5)
nRBC: 0 % (ref 0.0–0.2)

## 2018-12-06 LAB — LACTATE DEHYDROGENASE: LDH: 150 U/L (ref 98–192)

## 2018-12-06 MED ORDER — WARFARIN SODIUM 7.5 MG PO TABS
7.5000 mg | ORAL_TABLET | Freq: Once | ORAL | Status: AC
  Administered 2018-12-06: 7.5 mg via ORAL
  Filled 2018-12-06: qty 1

## 2018-12-06 NOTE — Progress Notes (Signed)
Patient ID: Theodore Welch, male   DOB: March 08, 1961, 58 y.o.   MRN: [id number]   Advanced Heart Failure VAD Team Note  PCP-Cardiologist: No primary care provider on file.   Subjective:    Admitted from the clinic 1/6 with wound infection to the right of his driveline. Initially on vancomycin and cefepime. Antibiotics narrowed to cefazolin. Finished 5 day course of diflucan for associated fungal rash.   To OR 12/03/2018 for debridement and wound vac. Wound culture sent, pending.   Feels OK. No further bleeding around wound vac site. Has about 300-400cc of bloody fluid in vac container. Remains on low-dose heparin and coumadin. INR 1.49. Hgb 9.6->9.5  Wound CX- Staph aureus -->MSSA Blood CX- NGTD.    LVAD Interrogation HM 3: Speed: 5700 Flow: 4.8 PI: 3.4 Power: 4.0 VAD interrogated personally. Parameters stable.   Objective:    Vital Signs:   Temp:  [97.7 F (36.5 C)-98.5 F (36.9 C)] 98.5 F (36.9 C) (01/19 0828) Pulse Rate:  [80-109] 93 (01/19 0828) Resp:  [16-23] 21 (01/19 0300) BP: (70-100)/(48-76) 92/76 (01/19 0828) SpO2:  [99 %-100 %] 100 % (01/19 0300) Last BM Date: 12/06/18   Mean arterial Pressure 70-80s Intake/Output:   Intake/Output Summary (Last 24 hours) at 12/06/2018 1051 Last data filed at 12/06/2018 1005 Gross per 24 hour  Intake 480 ml  Output 1350 ml  Net -870 ml     Physical Exam   General:  Sitting in chair watching TV. NAD.  HEENT: normal  Neck: supple. JVP not elevated.  Carotids 2+ bilat; no bruits. No lymphadenopathy or thryomegaly appreciated. Cor: LVAD hum.  Lungs: Clear. Abdomen: obese soft, nontender, non-distended. No hepatosplenomegaly. No bruits or masses. Good bowel sounds. Driveline site clean. Wound vac in place with good seal. Small hematoma above entry site. Anchor in place.  Extremities: no cyanosis, clubbing, rash. Warm no edema  Neuro: alert & oriented x 3. No focal deficits. Moves all 4 without problem    Telemetry   NSR  80-90s, personally reviewed.   EKG    No new tracings.    Labs   Basic Metabolic Panel: Recent Labs  Lab 12/02/18 0505 12/03/18 0025 12/04/18 0617 12/05/18 0251 12/06/18 0251  NA 142 139 139 142 142  K 3.8 3.9 4.0 3.9 4.1  CL 108 108 105 108 108  CO2 28 24 25 25 25   GLUCOSE 112* 138* 108* 119* 129*  BUN 8 8 14 12 13   CREATININE 0.83 0.89 0.88 0.94 0.89  CALCIUM 9.0 8.9 9.7 9.2 9.2    Liver Function Tests: No results for input(s): AST, ALT, ALKPHOS, BILITOT, PROT, ALBUMIN in the last 168 hours. No results for input(s): LIPASE, AMYLASE in the last 168 hours. No results for input(s): AMMONIA in the last 168 hours.  CBC: Recent Labs  Lab 12/02/18 0505 12/03/18 0025 12/04/18 0617 12/05/18 0251 12/06/18 0251  WBC 7.8 7.6 17.4* 8.8 8.5  HGB 9.7* 9.5* 10.0* 9.6* 9.5*  HCT 32.3* 31.5* 33.0* 32.5* 31.6*  MCV 82.0 80.4 80.1 81.5 81.0  PLT 369 362 410* 375 362    INR: Recent Labs  Lab 12/02/18 0505 12/03/18 0025 12/04/18 0617 12/05/18 0251 12/06/18 0251  INR 1.87 1.47 1.13 1.29 1.49    Other results:    Imaging   No results found.   Medications:     Scheduled Medications: . amLODipine  5 mg Oral Daily  . aspirin EC  81 mg Oral Daily  . atorvastatin  20 mg Oral  Daily  . carbamide peroxide  5 drop Both EARS TID  . DULoxetine  60 mg Oral Daily  . gabapentin  600 mg Oral TID  . insulin aspart  0-9 Units Subcutaneous TID WC  . metFORMIN  500 mg Oral BID WC  . nystatin   Topical BID  . pantoprazole  40 mg Oral Daily  . sacubitril-valsartan  1 tablet Oral BID  . sodium chloride flush  10-40 mL Intracatheter Q12H  . spironolactone  25 mg Oral Daily  . thiamine  100 mg Oral Daily  . vitamin B-12  1,000 mcg Oral Daily  . warfarin  7.5 mg Oral ONCE-1800  . Warfarin - Pharmacist Dosing Inpatient   Does not apply q1800    Infusions: . sodium chloride Stopped (11/23/18 1702)  .  ceFAZolin (ANCEF) IV 2 g (12/06/18 1046)  . heparin 800 Units/hr  (12/06/18 0813)  . 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  PICC line placed 11/26/2018. Transitioned to cefazolin per ID, will need prolonged course to heal.  Completed 5 day course of fluconazole on 11/28/18 for surrounding fungal infection.  CT with small amount of inflammation and fluid along the driveline. Blood CX NGTD.   - Referred to [facility] for home antibiotics. Will continue for 24 days total from 11/26/2018. - No fevers or chills. WBC 8.5k - Dr. Donata Clay and LVAD nurse coordinators to assess wound vac site, hopefully can get by with reinforcing it.   - He continues on low-dose heparin. No further bleeding around wound vac but does have about 400c of bloody fluid in canister. Will follow.   2. Chronic systolic CHF s/p HM-III VAD: Nonischemic cardiomyopathy, now s/p Heartmate 3 LVAD in 9/19. VAD interrogated personally. Parameters stable.  Volume status stable off po lasix. MAPs ok - Well compensated with VAD support. Volume status stable.  - Continue spironolactone25mg  daily.  -ContinueEntresto 97/103 mg BID.  - Continue amlodipine 5 mg daily. - Continue ASA 81 daily.  - Warfarin restarted for INR 2-2.5. INR 1.29. On low-dose heparin. Discussed dosing with PharmD personally.Watch for recurrent bleeding at wound vac site - VAD interrogated personally. Parameters stable. - LDH 150 - INR 1.49. Will continue low-dose heparin and coumadin. Discussed dosing with PharmD  personally.  3. Smoking: Still smoking a few cigs/day as outpatient. Using nicotine patches. Knows he needs to quit to be a transplant candidate. Encouraged cessation.  4. H/o RLE DVT:  Warfarin/heparin.  5. OSA: Continue CPAP. No change.  6. Type II diabetes: Well-controlled. Continue sliding scale while inpatient. No change.    I reviewed the LVAD parameters from today, and compared the results to the patient's prior recorded data.  No programming changes were made.  The LVAD is functioning within specified parameters.  The patient performs LVAD self-test daily.  LVAD interrogation was negative for any significant power changes, alarms or PI events/speed drops.  LVAD equipment check completed and is in good working order.  Back-up equipment present.   LVAD education done on emergency procedures and precautions and reviewed exit site care.   Length of Stay: 13  Arvilla Meres, MD 12/06/2018, 10:51 AM  VAD Team --- VAD ISSUES ONLY--- Pager [phone] (7am - 7am)  Advanced Heart Failure Team  Pager [phone] (M-F; 7a - 4p)  Please contact [facility] for night-coverage after hours (4p -7a ) and weekends on [email or web address]

## 2018-12-06 NOTE — Progress Notes (Signed)
ANTICOAGULATION CONSULT NOTE - Follow-Up Consult  Pharmacy Consult for heparin + warfarin Indication: LVAD  No Known Allergies  Patient Measurements: Height: 5\' 10"  (177.8 cm) Weight: 164 lb 3.9 oz (74.5 kg) IBW/kg (Calculated) : 73 Heparin Dosing Weight: 73kg  Vital Signs: Temp: 97.8 F (36.6 C) (01/19 0300) Temp Source: Oral (01/19 0300) BP: 100/74 (01/19 0300) Pulse Rate: 80 (01/19 0300)  Labs: Recent Labs    12/04/18 0617 12/05/18 0251 12/06/18 0251  HGB 10.0* 9.6* 9.5*  HCT 33.0* 32.5* 31.6*  PLT 410* 375 362  LABPROT 14.4 15.9* 17.8*  INR 1.13 1.29 1.49  HEPARINUNFRC <0.10* <0.10* <0.10*  CREATININE 0.88 0.94 0.89    Estimated Creatinine Clearance: 92.3 mL/min (by C-G formula based on SCr of 0.89 mg/dL).   Medical History: Past Medical History:  Diagnosis Date  . Cardiomyopathy, unspecified (HCC)   . CHF (congestive heart failure) (HCC)   . Chronic back pain   . Enlarged heart   . Gastric ulcer   . Gastroenteritis   . H/O degenerative disc disease     Assessment: 66 yoM with LVAD admitted with driveline infection. INR was subtherapeutic on admit so heparin started and warfarin held for now in case procedures are needed involving wound.   Warfarin initially resumed 1/7 but then held for OR 1/16 for drive line wound debridement. Discussed with Dr. Donata Clay - ok to resume warfarin 1/16 post op and will begin low-dose heparin infusion with no bolus - HL 1/17 < 0.1 and plans were to increase heparin to therapuetic dose with goal 0.3-0.4 - but oozing around driveline site.   Heparin level still undetectable this morning, bleeding stable/resolved. INR continues to trend up to 1.49. Hgb stable 9.5, LDH stable 150. Will increase heparin slightly, repeat higher dose warfarin.   *PTA Warfarin Dose = 7.5mg  Tues/Thurs/Sat/Sun, 5mg  Mon/Wed/Fri  Goal of Therapy:  INR 2-2.5 Heparin level 0.3-0.4 (lower goal with upcoming OR) Monitor platelets by anticoagulation  protocol: Yes   Plan:  -Repeat Warfarin 7.5mg  PO x1 tonight -Increase IV heparin 800 units/hr  -Daily INR, heparin level, CBC, LDH  Sheppard Coil PharmD., BCPS Clinical Pharmacist 12/06/2018 7:57 AM

## 2018-12-07 ENCOUNTER — Ambulatory Visit (HOSPITAL_COMMUNITY): Payer: Medicare Other

## 2018-12-07 LAB — CBC
HCT: 31.2 % — ABNORMAL LOW (ref 39.0–52.0)
Hemoglobin: 9.3 g/dL — ABNORMAL LOW (ref 13.0–17.0)
MCH: 23.9 pg — ABNORMAL LOW (ref 26.0–34.0)
MCHC: 29.8 g/dL — ABNORMAL LOW (ref 30.0–36.0)
MCV: 80.2 fL (ref 80.0–100.0)
Platelets: 324 10*3/uL (ref 150–400)
RBC: 3.89 MIL/uL — ABNORMAL LOW (ref 4.22–5.81)
RDW: 16 % — ABNORMAL HIGH (ref 11.5–15.5)
WBC: 8.5 10*3/uL (ref 4.0–10.5)
nRBC: 0 % (ref 0.0–0.2)

## 2018-12-07 LAB — BASIC METABOLIC PANEL
Anion gap: 8 (ref 5–15)
BUN: 12 mg/dL (ref 6–20)
CO2: 24 mmol/L (ref 22–32)
Calcium: 9 mg/dL (ref 8.9–10.3)
Chloride: 109 mmol/L (ref 98–111)
Creatinine, Ser: 0.94 mg/dL (ref 0.61–1.24)
GFR calc Af Amer: >60 mL/min (ref >60)
GFR calc non Af Amer: >60 mL/min (ref >60)
Glucose, Bld: 123 mg/dL — ABNORMAL HIGH (ref 70–99)
Potassium: 4.1 mmol/L (ref 3.5–5.1)
Sodium: 141 mmol/L (ref 135–145)

## 2018-12-07 LAB — AEROBIC CULTURE W GRAM STAIN (SUPERFICIAL SPECIMEN): Gram Stain: NONE SEEN

## 2018-12-07 LAB — LACTATE DEHYDROGENASE: LDH: 145 U/L (ref 98–192)

## 2018-12-07 LAB — PROTIME-INR
INR: 1.72
Prothrombin Time: 19.9 seconds — ABNORMAL HIGH (ref 11.4–15.2)

## 2018-12-07 LAB — GLUCOSE, CAPILLARY
GLUCOSE-CAPILLARY: 71 mg/dL (ref 70–99)
GLUCOSE-CAPILLARY: 125 mg/dL — AB (ref 70–99)
Glucose-Capillary: 118 mg/dL — ABNORMAL HIGH (ref 70–99)
Glucose-Capillary: 101 mg/dL — ABNORMAL HIGH (ref 70–99)

## 2018-12-07 LAB — HEPARIN LEVEL (UNFRACTIONATED)
Heparin Unfractionated: <0.1 IU/mL — ABNORMAL LOW (ref 0.30–0.70)
Heparin Unfractionated: <0.1 IU/mL — ABNORMAL LOW (ref 0.30–0.70)

## 2018-12-07 MED ORDER — WARFARIN SODIUM 7.5 MG PO TABS
7.5000 mg | ORAL_TABLET | Freq: Once | ORAL | Status: AC
  Administered 2018-12-07: 7.5 mg via ORAL
  Filled 2018-12-07: qty 1

## 2018-12-07 NOTE — Progress Notes (Signed)
ANTICOAGULATION CONSULT NOTE - Follow-Up Consult  Pharmacy Consult for heparin + warfarin Indication: LVAD  No Known Allergies  Patient Measurements: Height: 5\' 10"  (177.8 cm) Weight: 168 lb 3.4 oz (76.3 kg) IBW/kg (Calculated) : 73 Heparin Dosing Weight: 73kg  Vital Signs: Temp: 98 F (36.7 C) (01/20 0800) Temp Source: Oral (01/20 0800) BP: 121/85 (01/20 0800) Pulse Rate: 95 (01/20 0800)  Labs: Recent Labs    12/05/18 0251 12/06/18 0251 12/07/18 0247  HGB 9.6* 9.5* 9.3*  HCT 32.5* 31.6* 31.2*  PLT 375 362 324  LABPROT 15.9* 17.8* 19.9*  INR 1.29 1.49 1.72  HEPARINUNFRC <0.10* <0.10* <0.10*  CREATININE 0.94 0.89 0.94    Estimated Creatinine Clearance: 87.4 mL/min (by C-G formula based on SCr of 0.94 mg/dL).   Medical History: Past Medical History:  Diagnosis Date  . Cardiomyopathy, unspecified (HCC)   . CHF (congestive heart failure) (HCC)   . Chronic back pain   . Enlarged heart   . Gastric ulcer   . Gastroenteritis   . H/O degenerative disc disease     Assessment: 71 yoM with LVAD admitted with driveline infection. INR was subtherapeutic on admit so heparin started and warfarin held for now in case procedures are needed involving wound.   Warfarin initially resumed 1/7 but then held for OR 1/16 for drive line wound debridement. Discussed with Dr. Donata Clay - ok to resume warfarin 1/16 post op and will begin low-dose heparin infusion with no bolus - HL 1/17 < 0.1 and plans were to increase heparin to therapuetic dose with goal 0.3-0.4 - but oozing around driveline site.   Heparin level still undetectable this morning, bleeding stable/resolved. INR rising slowly.  CBC low but stable.  *PTA Warfarin Dose = 7.5mg  Tues/Thurs/Sat/Sun, 5mg  Mon/Wed/Fri  Goal of Therapy:  INR 2-2.5 Heparin level 0.3-0.4 (lower goal with upcoming OR) Monitor platelets by anticoagulation protocol: Yes   Plan:  -Repeat Warfarin 7.5mg  PO x1 tonight -Increase IV heparin 900  units/hr  -Recheck heparin level in 6 hrs -Daily INR, heparin level, CBC, LDH  Sheppard Coil PharmD., BCPS Clinical Pharmacist 12/07/2018 11:37 AM

## 2018-12-07 NOTE — Plan of Care (Signed)
?  Problem: Education: ?Goal: Knowledge of General Education information will improve ?Description: Including pain rating scale, medication(s)/side effects and non-pharmacologic comfort measures ?Outcome: Progressing ?  ?Problem: Health Behavior/Discharge Planning: ?Goal: Ability to manage health-related needs will improve ?Outcome: Progressing ?  ?Problem: Clinical Measurements: ?Goal: Ability to maintain clinical measurements within normal limits will improve ?Outcome: Progressing ?Goal: Will remain free from infection ?Outcome: Progressing ?Goal: Diagnostic test results will improve ?Outcome: Progressing ?Goal: Cardiovascular complication will be avoided ?Outcome: Progressing ?  ?Problem: Pain Managment: ?Goal: General experience of comfort will improve ?Outcome: Progressing ?  ?Problem: Safety: ?Goal: Ability to remain free from injury will improve ?Outcome: Progressing ?  ?Problem: Skin Integrity: ?Goal: Risk for impaired skin integrity will decrease ?Outcome: Progressing ?  ?

## 2018-12-07 NOTE — Progress Notes (Signed)
Patient ID: Theodore Welch, male   DOB: 1961-06-21, 58 y.o.   MRN: [id number]   Advanced Heart Failure VAD Team Note  PCP-Cardiologist: No primary care provider on file.   Subjective:    Admitted from the clinic 1/6 with wound infection to the right of his driveline. Initially on vancomycin and cefepime. Antibiotics narrowed to cefazolin. Finished 5 day course of diflucan for associated fungal rash.   To OR 12/03/2018 for debridement and wound vac. Wound culture sent, few S aureus pending susceptibilities.   Feels OK. No further bleeding around wound vac site. Has about 300 cc of bloody fluid in vac container. Remains on low-dose heparin and coumadin. INR 1.72. Hgb 9.6->9.5->9.3  Wound CX- Staph aureus -->MSSA Blood CX- NGTD.    LVAD Interrogation HM 3: Speed: 5700 Flow: 4.9 PI: 3.6 Power: 4.2. VAD interrogated personally. Parameters stable.   Objective:    Vital Signs:   Temp:  [97.4 F (36.3 C)-98.5 F (36.9 C)] 98 F (36.7 C) (01/20 0800) Pulse Rate:  [88-96] 95 (01/20 0800) Resp:  [19-24] 19 (01/20 0600) BP: (89-121)/(56-86) 121/85 (01/20 0800) SpO2:  [92 %-98 %] 98 % (01/19 2300) Weight:  [76.3 kg] 76.3 kg (01/20 0600) Last BM Date: 12/06/18   Mean arterial Pressure 80s-90s Intake/Output:   Intake/Output Summary (Last 24 hours) at 12/07/2018 7614 Last data filed at 12/07/2018 0800 Gross per 24 hour  Intake 1273.11 ml  Output 4000 ml  Net -2726.89 ml     Physical Exam   General: Well appearing this am. NAD.  HEENT: Normal. Neck: Supple, JVP 7-8 cm. Carotids OK.  Cardiac:  Mechanical heart sounds with LVAD hum present.  Lungs:  CTAB, normal effort.  Abdomen:  NT, ND, no HSM. No bruits or masses. +BS  LVAD exit site: Well-healed and incorporated. Dressing dry and intact. No erythema or drainage. Stabilization device present and accurately applied. Driveline dressing changed daily per sterile technique. Extremities:  Warm and dry. No cyanosis, clubbing, rash, or  edema.  Neuro:  Alert & oriented x 3. Cranial nerves grossly intact. Moves all 4 extremities w/o difficulty. Affect pleasant    Telemetry   NSR 80-90s, personally reviewed.   EKG    No new tracings.    Labs   Basic Metabolic Panel: Recent Labs  Lab 12/03/18 0025 12/04/18 0617 12/05/18 0251 12/06/18 0251 12/07/18 0247  NA 139 139 142 142 141  K 3.9 4.0 3.9 4.1 4.1  CL 108 105 108 108 109  CO2 24 25 25 25 24   GLUCOSE 138* 108* 119* 129* 123*  BUN 8 14 12 13 12   CREATININE 0.89 0.88 0.94 0.89 0.94  CALCIUM 8.9 9.7 9.2 9.2 9.0    Liver Function Tests: No results for input(s): AST, ALT, ALKPHOS, BILITOT, PROT, ALBUMIN in the last 168 hours. No results for input(s): LIPASE, AMYLASE in the last 168 hours. No results for input(s): AMMONIA in the last 168 hours.  CBC: Recent Labs  Lab 12/03/18 0025 12/04/18 0617 12/05/18 0251 12/06/18 0251 12/07/18 0247  WBC 7.6 17.4* 8.8 8.5 8.5  HGB 9.5* 10.0* 9.6* 9.5* 9.3*  HCT 31.5* 33.0* 32.5* 31.6* 31.2*  MCV 80.4 80.1 81.5 81.0 80.2  PLT 362 410* 375 362 324    INR: Recent Labs  Lab 12/03/18 0025 12/04/18 0617 12/05/18 0251 12/06/18 0251 12/07/18 0247  INR 1.47 1.13 1.29 1.49 1.72    Other results:    Imaging   No results found.   Medications:  Scheduled Medications: . amLODipine  5 mg Oral Daily  . aspirin EC  81 mg Oral Daily  . atorvastatin  20 mg Oral Daily  . carbamide peroxide  5 drop Both EARS TID  . DULoxetine  60 mg Oral Daily  . gabapentin  600 mg Oral TID  . insulin aspart  0-9 Units Subcutaneous TID WC  . metFORMIN  500 mg Oral BID WC  . nystatin   Topical BID  . pantoprazole  40 mg Oral Daily  . sacubitril-valsartan  1 tablet Oral BID  . sodium chloride flush  10-40 mL Intracatheter Q12H  . spironolactone  25 mg Oral Daily  . thiamine  100 mg Oral Daily  . vitamin B-12  1,000 mcg Oral Daily  . Warfarin - Pharmacist Dosing Inpatient   Does not apply q1800    Infusions: .  sodium chloride Stopped (11/23/18 1702)  .  ceFAZolin (ANCEF) IV 2 g (12/07/18 0300)  . heparin 800 Units/hr (12/07/18 0600)  . 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  PICC line placed 11/26/2018. Transitioned to cefazolin per ID, will need prolonged course to heal.  Completed 5 day course of fluconazole on 11/28/18 for surrounding fungal infection.  CT with small amount of inflammation and fluid along the driveline. Blood CX NGTD.  Referred to [facility] for home antibiotics. Will continue for 24 days total from 11/26/2018.  No fevers or chills. WBC 8.5 k. He continues on low-dose heparin. No further bleeding around wound vac. LDH 145.  - Will need wound VAC sponge change in OR later this week.  2. Chronic systolic CHF s/p HM-III VAD: Nonischemic cardiomyopathy, now s/p Heartmate 3 LVAD in 9/19. VAD interrogated personally. Parameters stable.  Volume status stable off po lasix. MAPs ok.  Well compensated with VAD support. Volume status stable. INR 1.72 today, he is on warfarin/heparin gtt.  - Continue spironolactone25mg  daily.  -ContinueEntresto 97/103 mg BID.  - Continue amlodipine 5 mg daily. - Continue ASA 81 daily.  - Continue warfarin + heparin, stop heparin gtt when INR >1.8.  Watch for recurrent bleeding at wound vac site 3. Smoking: Still smoking a few cigs/day as outpatient. Using nicotine patches. Knows he needs to quit to be a transplant candidate. Encouraged cessation.  4. H/o RLE  DVT:  Warfarin/heparin.  5. OSA: Continue CPAP. No change.  6. Type II diabetes: Well-controlled. Continue sliding scale while inpatient. No change.    I reviewed the LVAD parameters from today, and compared the results to the patient's prior recorded data.  No programming changes were made.  The LVAD is functioning within specified parameters.  The patient performs LVAD self-test daily.  LVAD interrogation was negative for any significant power changes, alarms or PI events/speed drops.  LVAD equipment check completed and is in good working order.  Back-up equipment present.   LVAD education done on emergency procedures and precautions and reviewed exit site care.   Length of Stay: 14  Marca Ancona, MD 12/07/2018, 8:12 AM  VAD Team --- VAD ISSUES ONLY--- Pager [phone] (7am - 7am)  Advanced Heart Failure Team  Pager [phone] (M-F; [place])  Please contact [facility] for night-coverage after hours (4p -7a ) and weekends on [email or web address]

## 2018-12-07 NOTE — Progress Notes (Signed)
ANTICOAGULATION CONSULT NOTE - Follow-Up Consult  Pharmacy Consult for heparin + warfarin Indication: LVAD  No Known Allergies  Patient Measurements: Height: 5\' 10"  (177.8 cm) Weight: 168 lb 3.4 oz (76.3 kg) IBW/kg (Calculated) : 73 Heparin Dosing Weight: 73kg  Vital Signs: Temp: 98.4 F (36.9 C) (01/20 1202) Temp Source: Oral (01/20 1202) BP: 105/85 (01/20 1219) Pulse Rate: 88 (01/20 1202)  Labs: Recent Labs    12/05/18 0251 12/06/18 0251 12/07/18 0247 12/07/18 1319  HGB 9.6* 9.5* 9.3*  --   HCT 32.5* 31.6* 31.2*  --   PLT 375 362 324  --   LABPROT 15.9* 17.8* 19.9*  --   INR 1.29 1.49 1.72  --   HEPARINUNFRC <0.10* <0.10* <0.10* <0.10*  CREATININE 0.94 0.89 0.94  --     Estimated Creatinine Clearance: 87.4 mL/min (by C-G formula based on SCr of 0.94 mg/dL).   Medical History: Past Medical History:  Diagnosis Date  . Cardiomyopathy, unspecified (HCC)   . CHF (congestive heart failure) (HCC)   . Chronic back pain   . Enlarged heart   . Gastric ulcer   . Gastroenteritis   . H/O degenerative disc disease     Assessment: 16 yoM with LVAD admitted with driveline infection. INR was subtherapeutic on admit so heparin started and warfarin held for now in case procedures are needed involving wound.   Warfarin initially resumed 1/7 but then held for OR 1/16 for drive line wound debridement. Discussed with Dr. Donata Clay - ok to resume warfarin 1/16 post op and will begin low-dose heparin infusion with no bolus - HL 1/17 < 0.1 and plans were to increase heparin to therapuetic dose with goal 0.3-0.4 - but oozing around driveline site.   Heparin level still undetectable this afternoon, bleeding stable/resolved. INR rising slowly.  CBC low but stable.  *PTA Warfarin Dose = 7.5mg  Tues/Thurs/Sat/Sun, 5mg  Mon/Wed/Fri  Goal of Therapy:  INR 2-2.5 Heparin level 0.3-0.4 (lower goal with upcoming OR) Monitor platelets by anticoagulation protocol: Yes   Plan:  -Repeat  Warfarin 7.5mg  PO x1 tonight -Increase IV heparin 1000 units/hr - discussed with Dr. Donata Clay. -Recheck heparin level with morning labs. -Daily INR, heparin level, CBC, LDH  Jenetta Downer, [facility] Clinical Pharmacist Phone [phone]  12/07/2018 3:35 PM

## 2018-12-07 NOTE — Progress Notes (Signed)
Pharmacy Antibiotic Note  Theodore Welch is a 58 y.o. 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  Pharmacy initially consulted for vancomycin and cefepime dosing, now narrowed to cefazolin with wound culture growing MSSA.  Repeat culture from 1/16 still w/ MSSA. Blood cultures remain negative, pt afebrile, WBC normal.   Plan: -Cefazolin 2g IV q8h through 2/3 per ID -Monitor renal function  Height: 5\' 10"  (177.8 cm) Weight: 168 lb 3.4 oz (76.3 kg) IBW/kg (Calculated) : 73  Temp (24hrs), Avg:98 F (36.7 C), Min:97.4 F (36.3 C), Max:98.3 F (36.8 C)  Recent Labs  Lab 12/03/18 0025 12/04/18 0617 12/05/18 0251 12/06/18 0251 12/07/18 0247  WBC 7.6 17.4* 8.8 8.5 8.5  CREATININE 0.89 0.88 0.94 0.89 0.94    Estimated Creatinine Clearance: 87.4 mL/min (by C-G formula based on SCr of 0.94 mg/dL).    No Known Allergies  Antimicrobials this admission: Cefazolin 1/8 >> (2/3) Fluconazole 1/6 >> (1/11) Vancomycin 1/6 >>1/8 Cefepime 1/6 >>1/7  Dose adjustments this admission: none  Microbiology results: 1/16 Wound - MSSA 1/6 LVAD Wound: MSSA 1/6 BCx: NG  Jenetta Downer, [facility] Clinical Pharmacist Phone [phone]  12/07/2018 11:43 AM

## 2018-12-07 NOTE — Progress Notes (Signed)
4 Days Post-Op Procedure(s) (LRB): WOUND DEBRIDEMENT WITH A-CELL (N/A) APPLICATION OF WOUND VAC (N/A) Subjective: Skin.  No more bleeding from wound VAC site Patient on low-dose heparin bridge while Coumadin is being reloaded Operative culture demonstrates light growth of Staphylococcus Mild-moderate serosanguineous drainage from wound VAC Minimal tenderness over power cord in epigastrium  Plan wound VAC change in OR on December 10 728 Objective: Vital signs in last 24 hours: Temp:  [97.4 F (36.3 C)-98.3 F (36.8 C)] 98 F (36.7 C) (01/20 0800) Pulse Rate:  [88-96] 95 (01/20 0800) Cardiac Rhythm: Normal sinus rhythm (01/20 0800) Resp:  [19-24] 19 (01/20 0600) BP: (89-121)/(56-86) 121/85 (01/20 0800) SpO2:  [92 %-98 %] 98 % (01/19 2300) Weight:  [76.3 kg] 76.3 kg (01/20 0600)  Hemodynamic parameters for last 24 hours:  Stable  Intake/Output from previous day: 01/19 0701 - 01/20 0700 In: 1273.1 [P.O.:680; I.V.:393.1; IV Piggyback:200] Out: 3550 [Urine:3550] Intake/Output this shift: Total I/O In: -  Out: 450 [Urine:450]  Wound VAC sponge collapsed without clot Abdominal wound nontender Normal VAD hum  Lab Results: Recent Labs    12/06/18 0251 12/07/18 0247  WBC 8.5 8.5  HGB 9.5* 9.3*  HCT 31.6* 31.2*  PLT 362 324   BMET:  Recent Labs    12/06/18 0251 12/07/18 0247  NA 142 141  K 4.1 4.1  CL 108 109  CO2 25 24  GLUCOSE 129* 123*  BUN 13 12  CREATININE 0.89 0.94  CALCIUM 9.2 9.0    PT/INR:  Recent Labs    12/07/18 0247  LABPROT 19.9*  INR 1.72   ABG    Component Value Date/Time   PHART 7.473 (H) 08/05/2018 0345   HCO3 26.9 08/05/2018 0345   TCO2 26 08/04/2018 1622   O2SAT 73.6 08/12/2018 0345   CBG (last 3)  Recent Labs    12/06/18 1658 12/06/18 2107 12/07/18 0802  GLUCAP 143* 131* 118*    Assessment/Plan: S/P Procedure(s) (LRB): WOUND DEBRIDEMENT WITH A-CELL (N/A) APPLICATION OF WOUND VAC (N/A) Continue IV heparin bridge until  INR approaches 2.0. Acceptable INR for wound VAC change  Is  2.0-2.4   LOS: 14 days    Theodore Welch 12/07/2018

## 2018-12-07 NOTE — Care Management Note (Addendum)
Case Management Note  Patient Details  Name: Theodore Welch MRN: [id number] Date of Birth: 02-Sep-1961  Subjective/Objective:   Pt admitted with drive line infection of LVAD                 Action/Plan:   PTA Independent from home.  Pt in agreement with discharging home on IV antibiotics.  CM provided Medicare .gov HH list and choice for DME - pt in agreement with [facility] - agency contacted and referral accepted for both [facility] and DME.   Expected Discharge Date:                  Expected Discharge Plan:  Home w Home Health Services  In-House Referral:     Discharge planning Services  CM Consult  Post Acute Care Choice:    Choice offered to:  Patient  DME Arranged:  IV pump/equipment DME Agency:  [facility]  HH Arranged:  RN [facility] Agency:  [facility]  Status of Service:  In process, will continue to follow  If discussed at Long Length of Stay Meetings, dates discussed:    Additional Comments: 12/07/2018  CVTS deferred DME preference to HF team - HF team in agreement to utilize [facility] - pt also in agreement.  [facility] contacted and tentative referral accepting pending order.  HF team will need to complete order form placed on shadow chart.  CM informed pt will need wound vac at discharge.  CM text paged attending to determine which wound vac DME agency is preferred - pt states no preference Cherylann Parr, RN 12/07/2018, 10:41 AM

## 2018-12-07 NOTE — Progress Notes (Signed)
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 Shirlee Latch.   HM III LVAD implanted on 08/03/18 by Dr. Laneta Simmers under Destination Therapy criteria due to smoking status.  Pt out of bed in the chair. States that he feels great. No problems with wound VAC over the weekend.  Vital signs: Temp:  98 HR: 95 Doppler Pressure:  84 Automatic BP:  121/85 (97) O2 Sat: 99% on RA Wt:  157>160>159.6>162.0>167.6>169.3>168.0>167.9>168.2 lbs   LVAD interrogation reveals:  Speed: 5700 Flow: 5.0 Power:  4.4w PI: 2.5 Alarms: none Events:  10-30 daily  Hematocrit: 32  Fixed speed: 5700 Low speed limit:  5400   Drive Line site/Periumbilical counterincision site: wound VAC in placed -125. CDI.  Labs:  LDH trend: 165>127>128>120>140>162>172>193>180>183>145  INR trend: 1.56>1.61>1.70>1.83>2.05>2.49>2.48>2.25>1.47>1.13>1.72  WBC: 7.6>17.4  Anticoagulation Plan: -INR Goal:  2.0 - 2.5  -ASA Dose:  81 mg daily - Coumadin re-started 11/24/18  Device: N/A - no ICD  Infection:  - BC's x 2 11/23/18>>NTD - LVAD Wound culture 11/24/18>>staphylococcus aureus   Plan/Recommendations:  1. Contact VAD Coordinator for any VAD equipment or driveline issues. 2. VAD coordinator will accompany to the OR on Wednesday.   [name] RN VAD Coordinator  Office: [phone]  24/7 Pager: [phone]

## 2018-12-08 ENCOUNTER — Inpatient Hospital Stay: Payer: Self-pay

## 2018-12-08 LAB — GLUCOSE, CAPILLARY
GLUCOSE-CAPILLARY: 150 mg/dL — AB (ref 70–99)
Glucose-Capillary: 121 mg/dL — ABNORMAL HIGH (ref 70–99)
Glucose-Capillary: 84 mg/dL (ref 70–99)
Glucose-Capillary: 124 mg/dL — ABNORMAL HIGH (ref 70–99)

## 2018-12-08 LAB — BASIC METABOLIC PANEL
Anion gap: 5 (ref 5–15)
BUN: 12 mg/dL (ref 6–20)
CO2: 27 mmol/L (ref 22–32)
Calcium: 9.1 mg/dL (ref 8.9–10.3)
Chloride: 108 mmol/L (ref 98–111)
Creatinine, Ser: 0.84 mg/dL (ref 0.61–1.24)
GFR calc Af Amer: >60 mL/min (ref >60)
GFR calc non Af Amer: >60 mL/min (ref >60)
Glucose, Bld: 110 mg/dL — ABNORMAL HIGH (ref 70–99)
Potassium: 4 mmol/L (ref 3.5–5.1)
Sodium: 140 mmol/L (ref 135–145)

## 2018-12-08 LAB — PROTIME-INR
INR: 1.75
Prothrombin Time: 20.2 seconds — ABNORMAL HIGH (ref 11.4–15.2)

## 2018-12-08 LAB — CBC
HCT: 29.5 % — ABNORMAL LOW (ref 39.0–52.0)
Hemoglobin: 9.2 g/dL — ABNORMAL LOW (ref 13.0–17.0)
MCH: 25.1 pg — ABNORMAL LOW (ref 26.0–34.0)
MCHC: 31.2 g/dL (ref 30.0–36.0)
MCV: 80.4 fL (ref 80.0–100.0)
Platelets: 314 10*3/uL (ref 150–400)
RBC: 3.67 MIL/uL — ABNORMAL LOW (ref 4.22–5.81)
RDW: 16.3 % — ABNORMAL HIGH (ref 11.5–15.5)
WBC: 7.1 10*3/uL (ref 4.0–10.5)
nRBC: 0 % (ref 0.0–0.2)

## 2018-12-08 LAB — LACTATE DEHYDROGENASE: LDH: 140 U/L (ref 98–192)

## 2018-12-08 LAB — HEPARIN LEVEL (UNFRACTIONATED): Heparin Unfractionated: <0.1 IU/mL — ABNORMAL LOW (ref 0.30–0.70)

## 2018-12-08 MED ORDER — HEPARIN (PORCINE) 25000 UT/250ML-% IV SOLN
1000.0000 [IU]/h | INTRAVENOUS | Status: DC
  Filled 2018-12-08: qty 250

## 2018-12-08 MED ORDER — ENSURE ENLIVE PO LIQD
237.0000 mL | Freq: Two times a day (BID) | ORAL | Status: DC
  Administered 2018-12-08 – 2018-12-10 (×4): 237 mL via ORAL

## 2018-12-08 MED ORDER — WARFARIN SODIUM 4 MG PO TABS
8.0000 mg | ORAL_TABLET | Freq: Once | ORAL | Status: AC
  Administered 2018-12-08: 8 mg via ORAL
  Filled 2018-12-08: qty 2

## 2018-12-08 MED ORDER — ADULT MULTIVITAMIN W/MINERALS CH
1.0000 | ORAL_TABLET | Freq: Every day | ORAL | Status: DC
  Administered 2018-12-09 – 2018-12-10 (×2): 1 via ORAL
  Filled 2018-12-08 (×2): qty 1

## 2018-12-08 NOTE — Progress Notes (Signed)
Patient ID: Theodore Welch, male   DOB: [birth year], 58 y.o.   MRN: [id number]   Advanced Heart Failure VAD Team Note  PCP-Cardiologist: No primary care provider on file.   Subjective:    Admitted from the clinic 1/6 with wound infection to the right of his driveline. Initially on vancomycin and cefepime. Antibiotics narrowed to cefazolin. Finished 5 day course of diflucan for associated fungal rash.   To OR 12/03/2018 for debridement and wound vac. Wound culture sent, few S aureus pending susceptibilities.   Feels OK. No further bleeding around wound vac site.  Remains on low-dose heparin and coumadin. INR 1.75. Hgb 9.6->9.5->9.3->9.2  Wound CX- Staph aureus -->MSSA Blood CX- NGTD.    LVAD Interrogation HM 3: Speed: 5700 Flow: 4.6 PI: 3.1 Power: 4.4. about 12 PI events/24 hrs.  VAD interrogated personally. Parameters stable.   Objective:    Vital Signs:   Temp:  [97.7 F (36.5 C)-98.4 F (36.9 C)] 98 F (36.7 C) (01/21 0409) Pulse Rate:  [81-98] 98 (01/21 0409) Resp:  [14-22] 21 (01/21 0409) BP: (73-121)/(56-85) 97/67 (01/21 0409) SpO2:  [98 %-100 %] 98 % (01/21 0409) Weight:  [77.8 kg] 77.8 kg (01/21 0409) Last BM Date: 12/07/18   Mean arterial Pressure 80s Intake/Output:   Intake/Output Summary (Last 24 hours) at 12/08/2018 0721 Last data filed at 12/08/2018 0600 Gross per 24 hour  Intake 4079.64 ml  Output 2210 ml  Net 1869.64 ml     Physical Exam   General: Well appearing this am. NAD.  HEENT: Normal. Neck: Supple, JVP 7-8 cm. Carotids OK.  Cardiac:  Mechanical heart sounds with LVAD hum present.  Lungs:  CTAB, normal effort.  Abdomen:  NT, ND, no HSM. No bruits or masses. +BS  LVAD exit site: Wound vac site stable with no bleeding.  Extremities:  Warm and dry. No cyanosis, clubbing, rash, or edema.  Neuro:  Alert & oriented x 3. Cranial nerves grossly intact. Moves all 4 extremities w/o difficulty. Affect pleasant     Telemetry   NSR 80-90s, personally  reviewed.   EKG    No new tracings.    Labs   Basic Metabolic Panel: Recent Labs  Lab 12/04/18 0617 12/05/18 0251 12/06/18 0251 12/07/18 0247 12/08/18 0256  NA 139 142 142 141 140  K 4.0 3.9 4.1 4.1 4.0  CL 105 108 108 109 108  CO2 25 25 25 24 27   GLUCOSE 108* 119* 129* 123* 110*  BUN 14 12 13 12 12   CREATININE 0.88 0.94 0.89 0.94 0.84  CALCIUM 9.7 9.2 9.2 9.0 9.1    Liver Function Tests: No results for input(s): AST, ALT, ALKPHOS, BILITOT, PROT, ALBUMIN in the last 168 hours. No results for input(s): LIPASE, AMYLASE in the last 168 hours. No results for input(s): AMMONIA in the last 168 hours.  CBC: Recent Labs  Lab 12/04/18 0617 12/05/18 0251 12/06/18 0251 12/07/18 0247 12/08/18 0256  WBC 17.4* 8.8 8.5 8.5 7.1  HGB 10.0* 9.6* 9.5* 9.3* 9.2*  HCT 33.0* 32.5* 31.6* 31.2* 29.5*  MCV 80.1 81.5 81.0 80.2 80.4  PLT 410* 375 362 324 314    INR: Recent Labs  Lab 12/04/18 0617 12/05/18 0251 12/06/18 0251 12/07/18 0247 12/08/18 0256  INR 1.13 1.29 1.49 1.72 1.75    Other results:    Imaging   No results found.   Medications:     Scheduled Medications: . amLODipine  5 mg Oral Daily  . aspirin EC  81 mg Oral  Daily  . atorvastatin  20 mg Oral Daily  . carbamide peroxide  5 drop Both EARS TID  . DULoxetine  60 mg Oral Daily  . gabapentin  600 mg Oral TID  . insulin aspart  0-9 Units Subcutaneous TID WC  . metFORMIN  500 mg Oral BID WC  . nystatin   Topical BID  . pantoprazole  40 mg Oral Daily  . sacubitril-valsartan  1 tablet Oral BID  . sodium chloride flush  10-40 mL Intracatheter Q12H  . spironolactone  25 mg Oral Daily  . thiamine  100 mg Oral Daily  . vitamin B-12  1,000 mcg Oral Daily  . Warfarin - Pharmacist Dosing Inpatient   Does not apply q1800    Infusions: . sodium chloride Stopped (11/23/18 1702)  .  ceFAZolin (ANCEF) IV 2 g (12/08/18 0452)  . heparin 1,000 Units/hr (12/08/18 0450)  . 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  PICC line placed 11/26/2018. Transitioned to cefazolin per ID, will need prolonged course to heal.  Completed 5 day course of fluconazole on 11/28/18 for surrounding fungal infection.  CT with small amount of inflammation and fluid along the driveline. Blood CX NGTD.  Referred to [facility] for home antibiotics. Will continue for 24 days total from 11/26/2018.  No fevers or chills. WBC 7.1 k. He continues on low-dose heparin. No further bleeding around wound vac. LDH 140.  - Will need wound VAC sponge change in OR later this week, ?Wednesday.  OK for INR 2-2.4 for wound vac change.  2. Chronic systolic CHF s/p HM-III VAD: Nonischemic cardiomyopathy, now s/p Heartmate 3 LVAD in 9/19. VAD interrogated personally. Parameters stable.  Volume status stable off po lasix. MAPs ok.  Well compensated with VAD support. Volume status stable. INR 1.75 today, he is on warfarin/heparin gtt.  - Continue spironolactone25mg  daily.  -ContinueEntresto 97/103 mg BID.  - Continue amlodipine 5 mg daily. - Continue ASA 81 daily.  - Continue warfarin + heparin, stop heparin gtt when INR >1.8.  Watch for recurrent bleeding at wound vac site 3. Smoking: Still smoking a few cigs/day as outpatient. Using nicotine patches. Knows he needs to quit to be a transplant candidate. Encouraged cessation.  4. H/o RLE DVT:  Warfarin/heparin.  5. OSA: Continue CPAP. No change.  6. Type II diabetes: Well-controlled. Continue sliding  scale while inpatient. No change.    I reviewed the LVAD parameters from today, and compared the results to the patient's prior recorded data.  No programming changes were made.  The LVAD is functioning within specified parameters.  The patient performs LVAD self-test daily.  LVAD interrogation was negative for any significant power changes, alarms or PI events/speed drops.  LVAD equipment check completed and is in good working order.  Back-up equipment present.   LVAD education done on emergency procedures and precautions and reviewed exit site care.   Length of Stay: 15  Marca Ancona, MD 12/08/2018, 7:21 AM  VAD Team --- VAD ISSUES ONLY--- Pager [phone] (7am - 7am)  Advanced Heart Failure Team  Pager [phone] (M-F; 7a - 4p)  Please contact [facility] for night-coverage after hours (4p -7a ) and weekends on [email or web address]

## 2018-12-08 NOTE — Progress Notes (Addendum)
5 Days Post-Op Procedure(s) (LRB): WOUND DEBRIDEMENT WITH A-CELL (N/A) APPLICATION OF WOUND VAC (N/A) Subjective: afebrile No bleeding from wound Plan VAC change in OR tomorrow am Stop iv heparin at 0500 Objective: Vital signs in last 24 hours: Temp:  [97.7 F (36.5 C)-98.4 F (36.9 C)] 98 F (36.7 C) (01/21 0409) Pulse Rate:  [81-98] 98 (01/21 0409) Cardiac Rhythm: Normal sinus rhythm (01/21 0409) Resp:  [14-22] 21 (01/21 0409) BP: (73-106)/(56-85) 97/67 (01/21 0409) SpO2:  [98 %-100 %] 98 % (01/21 0409) Weight:  [77.8 kg] 77.8 kg (01/21 0409)  Hemodynamic parameters for last 24 hours:  stable  Intake/Output from previous day: 01/20 0701 - 01/21 0700 In: 4079.6 [I.V.:212.4; IV Piggyback:3867.2] Out: 2210 [Urine:2210] Intake/Output this shift: No intake/output data recorded.  Vac sponge compressed w/o clot Normal VAD hum  WOUND measurements: 7 cm long, 3 cm wide, 2.5 cm deep  Lab Results: Recent Labs    12/07/18 0247 12/08/18 0256  WBC 8.5 7.1  HGB 9.3* 9.2*  HCT 31.2* 29.5*  PLT 324 314   BMET:  Recent Labs    12/07/18 0247 12/08/18 0256  NA 141 140  K 4.1 4.0  CL 109 108  CO2 24 27  GLUCOSE 123* 110*  BUN 12 12  CREATININE 0.94 0.84  CALCIUM 9.0 9.1    PT/INR:  Recent Labs    12/08/18 0256  LABPROT 20.2*  INR 1.75   ABG    Component Value Date/Time   PHART 7.473 (H) 08/05/2018 0345   HCO3 26.9 08/05/2018 0345   TCO2 26 08/04/2018 1622   O2SAT 73.6 08/12/2018 0345   CBG (last 3)  Recent Labs    12/07/18 1201 12/07/18 1624 12/07/18 2127  GLUCAP 71 101* 125*    Assessment/Plan: S/P Procedure(s) (LRB): WOUND DEBRIDEMENT WITH A-CELL (N/A) APPLICATION OF WOUND VAC (N/A) OR in am for [facility] change May be ready for home vac therapy if wound has improved   LOS: 15 days    [name] 12/08/2018

## 2018-12-08 NOTE — Progress Notes (Signed)
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 Shirlee Latch.   HM III LVAD implanted on 08/03/18 by Dr. Laneta Simmers under Destination Therapy criteria due to smoking status.  Pt walking around room, states that he feels great. Wound VAC started alarming leakage last night. Nurses  Vital signs: Temp:  97.8 HR: 98 Doppler Pressure:  80 Automatic BP:  97/67 (76) O2 Sat: 98% on RA Wt:  157>160>159.6>162.0>167.6>169.3>168.0>167.9>168.2>171.5 lbs   LVAD interrogation reveals:  Speed: 5700 Flow: 4.8 Power:  4.5w PI: 2.9 Alarms: none Events:  10-20 daily  Hematocrit: 29  Fixed speed: 5700 Low speed limit:  5400   Drive Line site/Periumbilical counterincision site: wound VAC in placed. CDI. Started alarming last night. Nurses have reinforced tegaderm, continues to alarm. Suction dropped to -75, PVT aware. Pt going to OR in the morning for wound vac change.   Labs:  LDH trend: 165>127>128>120>140>162>172>193>180>183>145>140  INR trend: 1.56>1.61>1.70>1.83>2.05>2.49>2.48>2.25>1.47>1.13>1.72>1.75  WBC: 7.6>17.4>7.1  Anticoagulation Plan: -INR Goal:  2.0 - 2.5  -ASA Dose:  81 mg daily - Coumadin re-started 11/24/18 -Heparin gtt per pharmacy  Device: N/A - no ICD  Infection:  - BC's x 2 11/23/18>>NTD - LVAD Wound culture 11/24/18>>staphylococcus aureus   Plan/Recommendations:  1. Contact VAD Coordinator for any VAD equipment or driveline issues. 2. VAD coordinator will accompany to the OR on Wednesday.  3. Pt is NPO after midnight.  [name] RN VAD Coordinator  Office: [phone]  24/7 Pager: [phone]

## 2018-12-08 NOTE — Progress Notes (Addendum)
Nutrition Follow-up  DOCUMENTATION CODES:   Not applicable  INTERVENTION:   -Ensure Enlive po BID, each supplement provides 350 kcal and 20 grams of protein -MVI with minerals daily  NUTRITION DIAGNOSIS:   Increased nutrient needs related to post-op healing as evidenced by estimated needs.  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 Pt admitted with driveline infection.   1/8- CT scan showed indurated mid abdomen soft tissue without abscess 1/9- s/p PICC placement 1/16- s/p wound debridement with wound vac placement  Reviewed I/O's: +1.9 L x 24 hours and +944 ml since 11/24/18  Spoke with pt, who was in good spirits today. He reports he is preparing to walk around the unit. He reports his appetite continues to be good; noted meal completion 75-100%. Pt shares plan for wound vac change tomorrow with CVTS with potential for discharge home later this week.   Discussed with pt importance of good meal and supplement intake to promote healing. Pt expressed understanding, however, denies any further nutrition related questions or concerns at this time.   Labs reviewed: CBGS: 101-125 (inpatient orders for glycemic control are 0-9 units insulin aspart TID with meals and 500 mg metformin BID).   Diet Order:   Diet Order            Diet NPO time specified  Diet effective midnight        Diet regular Room service appropriate? Yes; Fluid consistency: Thin  Diet effective now              EDUCATION NEEDS:   Education needs have been addressed  Skin:  Skin Assessment: Skin Integrity Issues: Skin Integrity Issues:: Wound VAC Wound Vac: lt abdomen  Last BM:  12/08/18  Height:   Ht  Readings from Last 1 Encounters:  12/03/18 5\' 10"  (1.778 m)    Weight:   Wt Readings from Last 1 Encounters:  12/08/18 77.8 kg    Ideal Body Weight:  75.5 kg  BMI:  Body mass index is 24.61 kg/m.  Estimated Nutritional Needs:   Kcal:  2200-2400  Protein:  115-130 grams  Fluid:  >2.2 L    Konni Kesinger A. Mayford Knife, RD, LDN, CDE Pager: [phone] After hours Pager: [phone]

## 2018-12-08 NOTE — Progress Notes (Signed)
ANTICOAGULATION CONSULT NOTE - Follow-Up Consult  Pharmacy Consult for heparin + warfarin Indication: LVAD  No Known Allergies  Patient Measurements: Height: 5\' 10"  (177.8 cm) Weight: 171 lb 8.3 oz (77.8 kg) IBW/kg (Calculated) : 73 Heparin Dosing Weight: 73kg  Vital Signs: Temp: 98 F (36.7 C) (01/21 0409) Temp Source: Oral (01/21 0409) BP: 97/67 (01/21 0409) Pulse Rate: 98 (01/21 0409)  Labs: Recent Labs    12/06/18 0251 12/07/18 0247 12/07/18 1319 12/08/18 0256  HGB 9.5* 9.3*  --  9.2*  HCT 31.6* 31.2*  --  29.5*  PLT 362 324  --  314  LABPROT 17.8* 19.9*  --  20.2*  INR 1.49 1.72  --  1.75  HEPARINUNFRC <0.10* <0.10* <0.10* <0.10*  CREATININE 0.89 0.94  --  0.84    Estimated Creatinine Clearance: 97.8 mL/min (by C-G formula based on SCr of 0.84 mg/dL).   Medical History: Past Medical History:  Diagnosis Date  . Cardiomyopathy, unspecified (HCC)   . CHF (congestive heart failure) (HCC)   . Chronic back pain   . Enlarged heart   . Gastric ulcer   . Gastroenteritis   . H/O degenerative disc disease     Assessment: 66 yoM with LVAD admitted with driveline infection. INR was subtherapeutic on admit so heparin started and warfarin held for now in case procedures are needed involving wound.   Warfarin initially resumed 1/7 but then held for OR 1/16 for drive line wound debridement. Discussed with Dr. Donata Clay - ok to resume warfarin 1/16 post op and will begin low-dose heparin infusion with no bolus - HL 1/17 < 0.1 and plans were to increase heparin to therapuetic dose with goal 0.3-0.4 - but oozing around driveline site.   Heparin level still undetectable, bleeding stable/resolved. INR rising slowly. CBC low but stable. Plan to go to OR in am for VAC change, will not make heparin changes at this time and ensure 0500 stop time is entered.   *PTA Warfarin Dose = 7.5mg  Tues/Thurs/Sat/Sun, 5mg  Mon/Wed/Fri  Goal of Therapy:  INR 2-2.5 Heparin level 0.3-0.4  (lower goal with upcoming OR) Monitor platelets by anticoagulation protocol: Yes   Plan:  -Warfarin 8mg  PO x1 tonight -Continue IV heparin 1000 units/hr  -heparin off at 0500 for OR tomorrow -Daily INR, heparin level, CBC, LDH  Sheppard Coil PharmD., BCPS Clinical Pharmacist 12/08/2018 8:31 AM

## 2018-12-08 NOTE — Anesthesia Preprocedure Evaluation (Addendum)
Anesthesia Evaluation  Patient identified by MRN, date of birth, ID band Patient awake    Reviewed: Allergy & Precautions, NPO status , Patient's Chart, lab work & pertinent test results  History of Anesthesia Complications Negative for: history of anesthetic complications  Airway Mallampati: II  TM Distance: >3 FB Neck ROM: Full    Dental no notable dental hx.    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; PUD; cirrhosis; OSA; DM  Reproductive/Obstetrics                            Anesthesia Physical Anesthesia Plan  ASA: IV  Anesthesia Plan: General   Post-op Pain Management:    Induction: Intravenous  PONV Risk Score and Plan: 1 and Treatment may vary due to age or medical condition and Ondansetron  Airway Management Planned: LMA and Oral ETT  Additional Equipment: None  Intra-op Plan:   Post-operative Plan: Extubation in OR  Informed Consent: I have reviewed the patients History and Physical, chart, labs and discussed the procedure including the risks, benefits and alternatives for the proposed anesthesia with the patient or authorized representative who has indicated his/her understanding and acceptance.     Dental advisory given  Plan Discussed with:   Anesthesia Plan Comments:        Anesthesia Quick Evaluation

## 2018-12-09 ENCOUNTER — Encounter (HOSPITAL_COMMUNITY): Payer: Medicare Other

## 2018-12-09 ENCOUNTER — Ambulatory Visit (HOSPITAL_COMMUNITY): Payer: Medicare Other

## 2018-12-09 ENCOUNTER — Encounter (HOSPITAL_COMMUNITY)
Admission: AD | Disposition: A | Discharge status: Home Health | Payer: Self-pay | Source: Ambulatory Visit | Attending: Cardiology

## 2018-12-09 ENCOUNTER — Inpatient Hospital Stay (HOSPITAL_COMMUNITY): Payer: Medicare HMO

## 2018-12-09 HISTORY — PX: APPLICATION OF WOUND VAC: SHX5189

## 2018-12-09 LAB — BASIC METABOLIC PANEL
Anion gap: 9 (ref 5–15)
BUN: 12 mg/dL (ref 6–20)
CO2: 23 mmol/L (ref 22–32)
Calcium: 9 mg/dL (ref 8.9–10.3)
Chloride: 109 mmol/L (ref 98–111)
Creatinine, Ser: 0.81 mg/dL (ref 0.61–1.24)
GFR calc Af Amer: >60 mL/min (ref >60)
GFR calc non Af Amer: >60 mL/min (ref >60)
Glucose, Bld: 123 mg/dL — ABNORMAL HIGH (ref 70–99)
Potassium: 4.2 mmol/L (ref 3.5–5.1)
Sodium: 141 mmol/L (ref 135–145)

## 2018-12-09 LAB — HEPARIN LEVEL (UNFRACTIONATED): Heparin Unfractionated: <0.1 IU/mL — ABNORMAL LOW (ref 0.30–0.70)

## 2018-12-09 LAB — GLUCOSE, CAPILLARY
Glucose-Capillary: 95 mg/dL (ref 70–99)
Glucose-Capillary: 76 mg/dL (ref 70–99)
Glucose-Capillary: 111 mg/dL — ABNORMAL HIGH (ref 70–99)
Glucose-Capillary: 57 mg/dL — ABNORMAL LOW (ref 70–99)
Glucose-Capillary: 212 mg/dL — ABNORMAL HIGH (ref 70–99)

## 2018-12-09 LAB — CBC
HCT: 29.8 % — ABNORMAL LOW (ref 39.0–52.0)
Hemoglobin: 9.3 g/dL — ABNORMAL LOW (ref 13.0–17.0)
MCH: 25.2 pg — ABNORMAL LOW (ref 26.0–34.0)
MCHC: 31.2 g/dL (ref 30.0–36.0)
MCV: 80.8 fL (ref 80.0–100.0)
Platelets: 276 10*3/uL (ref 150–400)
RBC: 3.69 MIL/uL — ABNORMAL LOW (ref 4.22–5.81)
RDW: 16.3 % — ABNORMAL HIGH (ref 11.5–15.5)
WBC: 6.7 10*3/uL (ref 4.0–10.5)
nRBC: 0 % (ref 0.0–0.2)

## 2018-12-09 LAB — PROTIME-INR
INR: 1.9
Prothrombin Time: 21.6 seconds — ABNORMAL HIGH (ref 11.4–15.2)

## 2018-12-09 LAB — LACTATE DEHYDROGENASE: LDH: 172 U/L (ref 98–192)

## 2018-12-09 SURGERY — APPLICATION, WOUND VAC
Anesthesia: General | Site: Abdomen

## 2018-12-09 MED ORDER — MIDAZOLAM HCL 2 MG/2ML IJ SOLN
INTRAMUSCULAR | Status: AC
  Filled 2018-12-09: qty 2

## 2018-12-09 MED ORDER — FENTANYL CITRATE (PF) 100 MCG/2ML IJ SOLN: 25.0000 ug | INTRAMUSCULAR | Status: DC | PRN

## 2018-12-09 MED ORDER — LIDOCAINE 2% (20 MG/ML) 5 ML SYRINGE
INTRAMUSCULAR | Status: DC | PRN
  Administered 2018-12-09: 100 mg via INTRAVENOUS

## 2018-12-09 MED ORDER — LACTATED RINGERS IV SOLN
INTRAVENOUS | Status: DC | PRN
  Administered 2018-12-09: 07:00:00 via INTRAVENOUS

## 2018-12-09 MED ORDER — WARFARIN SODIUM 7.5 MG PO TABS
7.5000 mg | ORAL_TABLET | Freq: Once | ORAL | Status: AC
  Administered 2018-12-09: 7.5 mg via ORAL
  Filled 2018-12-09: qty 1

## 2018-12-09 MED ORDER — PROPOFOL 10 MG/ML IV BOLUS
INTRAVENOUS | Status: DC | PRN
  Administered 2018-12-09 (×2): 50 mg via INTRAVENOUS

## 2018-12-09 MED ORDER — ETOMIDATE 2 MG/ML IV SOLN
INTRAVENOUS | Status: AC
  Filled 2018-12-09: qty 10

## 2018-12-09 MED ORDER — OXYCODONE HCL 5 MG PO TABS: 5.0000 mg | ORAL_TABLET | Freq: Once | ORAL | Status: DC | PRN

## 2018-12-09 MED ORDER — SODIUM CHLORIDE 0.9 % IV SOLN
INTRAVENOUS | Status: DC | PRN
  Administered 2018-12-09: 25 ug/min via INTRAVENOUS

## 2018-12-09 MED ORDER — OXYCODONE HCL 5 MG/5ML PO SOLN: 5.0000 mg | Freq: Once | ORAL | Status: DC | PRN

## 2018-12-09 MED ORDER — ONDANSETRON HCL 4 MG/2ML IJ SOLN: 4.0000 mg | Freq: Once | INTRAMUSCULAR | Status: DC | PRN

## 2018-12-09 MED ORDER — SODIUM CHLORIDE 0.9 % IR SOLN
Status: DC | PRN
  Administered 2018-12-09: 1000 mL

## 2018-12-09 MED ORDER — HEPARIN (PORCINE) 25000 UT/250ML-% IV SOLN: 700.0000 [IU]/h | INTRAVENOUS | Status: DC

## 2018-12-09 MED ORDER — PROPOFOL 10 MG/ML IV BOLUS
INTRAVENOUS | Status: AC
  Filled 2018-12-09: qty 40

## 2018-12-09 MED ORDER — ONDANSETRON HCL 4 MG/2ML IJ SOLN
INTRAMUSCULAR | Status: DC | PRN
  Administered 2018-12-09: 4 mg via INTRAVENOUS

## 2018-12-09 MED ORDER — MIDAZOLAM HCL 2 MG/2ML IJ SOLN
INTRAMUSCULAR | Status: DC | PRN
  Administered 2018-12-09: 1 mg via INTRAVENOUS

## 2018-12-09 MED ORDER — FENTANYL CITRATE (PF) 250 MCG/5ML IJ SOLN
INTRAMUSCULAR | Status: DC | PRN
  Administered 2018-12-09 (×2): 50 ug via INTRAVENOUS

## 2018-12-09 MED ORDER — FENTANYL CITRATE (PF) 250 MCG/5ML IJ SOLN
INTRAMUSCULAR | Status: AC
  Filled 2018-12-09: qty 5

## 2018-12-09 SURGICAL SUPPLY — 25 items
CANISTER SUCT 3000ML PPV (MISCELLANEOUS) ×3 IMPLANT
DRAPE LAPAROSCOPIC ABDOMINAL (DRAPES) ×3 IMPLANT
DRSG CUTIMED SORBACT 7X9 (GAUZE/BANDAGES/DRESSINGS) ×3 IMPLANT
DRSG TEGADERM 4X4.75 (GAUZE/BANDAGES/DRESSINGS) ×6 IMPLANT
ELECT REM PT RETURN 9FT ADLT (ELECTROSURGICAL) ×3
ELECTRODE REM PT RTRN 9FT ADLT (ELECTROSURGICAL) ×1 IMPLANT
GAUZE SPONGE 4X4 12PLY STRL (GAUZE/BANDAGES/DRESSINGS) ×3 IMPLANT
GLOVE BIO SURGEON STRL SZ7.5 (GLOVE) ×6 IMPLANT
GOWN STRL REUS W/ TWL LRG LVL3 (GOWN DISPOSABLE) ×2 IMPLANT
GOWN STRL REUS W/TWL LRG LVL3 (GOWN DISPOSABLE) ×4
KIT BASIN OR (CUSTOM PROCEDURE TRAY) ×3 IMPLANT
KIT TURNOVER KIT B (KITS) ×3 IMPLANT
MATRIX WOUND 3-LAYER 5X5 (Tissue) ×2 IMPLANT
MICROMATRIX 500MG (Tissue) ×3 IMPLANT
NS IRRIG 1000ML POUR BTL (IV SOLUTION) ×3 IMPLANT
PACK GENERAL/GYN (CUSTOM PROCEDURE TRAY) ×3 IMPLANT
PAD ARMBOARD 7.5X6 YLW CONV (MISCELLANEOUS) ×6 IMPLANT
SOLUTION PARTIC MCRMTRX 500MG (Tissue) ×1 IMPLANT
SURGILUBE 2OZ TUBE FLIPTOP (MISCELLANEOUS) ×3 IMPLANT
SUT VIC AB 3-0 SH 8-18 (SUTURE) ×3 IMPLANT
SUT VIC AB 3-0 X1 27 (SUTURE) ×6 IMPLANT
TOWEL GREEN STERILE (TOWEL DISPOSABLE) ×3 IMPLANT
TOWEL GREEN STERILE FF (TOWEL DISPOSABLE) ×3 IMPLANT
WATER STERILE IRR 1000ML POUR (IV SOLUTION) ×3 IMPLANT
WOUND MATRIX 3-LAYER 5X5 (Tissue) ×1 IMPLANT

## 2018-12-09 NOTE — Anesthesia Procedure Notes (Signed)
Procedure Name: LMA Insertion Performed by: Alvera Novel, CRNA Pre-anesthesia Checklist: Patient identified, Emergency Drugs available, Suction available and Patient being monitored Patient Re-evaluated:Patient Re-evaluated prior to induction Oxygen Delivery Method: Circle System Utilized Preoxygenation: Pre-oxygenation with 100% oxygen Induction Type: IV induction Ventilation: Mask ventilation without difficulty LMA: LMA with gastric port inserted LMA Size: 5.0 Number of attempts: 1 Placement Confirmation: positive ETCO2 Tube secured with: Tape Dental Injury: Teeth and Oropharynx as per pre-operative assessment  Comments: Inserted by Peggyann Juba, SRNA

## 2018-12-09 NOTE — Anesthesia Postprocedure Evaluation (Signed)
Anesthesia Post Note  Patient: Theodore Welch  Procedure(s) Performed: Removal of Wound Vac, Application of ACELL (N/A Abdomen)     Patient location during evaluation: PACU Anesthesia Type: General Level of consciousness: awake and alert Pain management: pain level controlled Vital Signs Assessment: post-procedure vital signs reviewed and stable Respiratory status: spontaneous breathing, nonlabored ventilation and respiratory function stable Cardiovascular status: blood pressure returned to baseline and stable Postop Assessment: no apparent nausea or vomiting Anesthetic complications: no    Last Vitals:  Vitals:   12/09/18 0855 12/09/18 1239  BP: 94/75 (!) 85/74  Pulse: 84 96  Resp: (!) 27 20  Temp: (!) 36.3 C (!) 36.4 C  SpO2: 99% 100%    Last Pain:  Vitals:   12/09/18 1239  TempSrc: Oral  PainSc: 0-No pain                 Lucretia Kern

## 2018-12-09 NOTE — Op Note (Signed)
NAME: JYLEN, RICHNER MEDICAL RECORD LY:[id number] ACCOUNT [id number] DATE OF BIRTH:20-May-1961 FACILITY: [facility] LOCATION: [facility] PHYSICIAN:PETER VAN TRIGT III, MD  OPERATIVE REPORT  DATE OF PROCEDURE:  12/09/2018  OPERATION: 1.  Removal of wound VAC from abdominal wound and irrigation of wound. 2.  Application of ACell powder (500 mg) and ACell sheet.  SURGEON:  [name], MD  PREOPERATIVE DIAGNOSIS:  Open abdominal wound infection at left ventricular assist device power cord exit site, left upper quadrant.  POSTOPERATIVE DIAGNOSIS:  Open abdominal wound infection at left ventricular assist device power cord exit site, left upper quadrant.  ANESTHESIA:  General.  CLINICAL NOTE:  The patient was prepared for weekly wound VAC change or removal and wound care.  Informed consent for this procedure had been unchanged with general anesthesia planned.  The patient's heparin had been stopped 3 hours prior to the  procedure.  Informed consent was documented in preop holding, and all questions were addressed.  DESCRIPTION OF PROCEDURE:  The patient was brought to the operating room and placed supine on the operating table, and general anesthesia was induced.  The patient remained stable.  The VAD coordinator was present in the operating room for the entire  procedure to monitor VAD parameters and hemodynamic status.  The previous wound VAC sheaths and wound VAC sponge were removed.  The abdomen and lower chest were prepped and draped as a sterile field.  A proper time-out was performed.  The previously placed Sorbact sheath was removed by dividing the small sutures, keeping it intact.  The wound was inspected.  The wound measured 5 cm long, 2.5 cm wide, and 1.5 cm deep.  There was no purulence.  It was starting to incorporate well.  The previously placed ACell products were still intact.  The wound was gently irrigated, and 500 mg of powder was inserted into  the depths of the  wound.  Over the powder, a ACell sheet was placed and tacked with 3-0 Vicryl sutures.  Over this, a Sorbact sheath was placed and tacked to the skin with 3-0 Vicryl sutures.  Over this a lubricant, sterile gel, was placed followed by  wet-to-dry dressings and Tegaderm dressings.  The patient was reversed from anesthesia and returned to recovery room in stable condition.  Blood loss was minimal.  LN/NUANCE  D:12/09/2018 T:12/09/2018 JOB:[id number]

## 2018-12-09 NOTE — Progress Notes (Addendum)
Patient ID: Theodore BaarsBilly E Welch, male   DOB: 05/26/1961, 58 y.o.   MRN: [id number]   Advanced Heart Failure VAD Team Note  PCP-Cardiologist: No primary care provider on file.   Subjective:    Admitted from the clinic 1/6 with wound infection to the right of his driveline. Initially on vancomycin and cefepime. Antibiotics narrowed to cefazolin. Finished 5 day course of diflucan for associated fungal rash.   To OR 12/03/2018 for debridement and wound vac. Wound culture sent, few S aureus pending susceptibilities.   Returned to the OR today. VAC removed.   Wants  to go home.    Wound CX- Staph aureus -->MSSA Blood CX- NGTD.    LVAD Interrogation HM 3: Speed: 5700 Flow: 4.8  PI: 3.2 Power: 4   VAD interrogated personally. Parameters stable.   Objective:    Vital Signs:   Temp:  [97.3 F (36.3 C)-98.7 F (37.1 C)] 97.3 F (36.3 C) (01/22 0855) Pulse Rate:  [56-100] 84 (01/22 0855) Resp:  [14-28] 27 (01/22 0855) BP: (87-112)/(67-93) 94/75 (01/22 0855) SpO2:  [97 %-100 %] 99 % (01/22 0855) Weight:  [74.7 kg] 74.7 kg (01/22 0500) Last BM Date: 12/07/18   Mean arterial Pressure 80s Intake/Output:   Intake/Output Summary (Last 24 hours) at 12/09/2018 1055 Last data filed at 12/09/2018 0844 Gross per 24 hour  Intake 1440 ml  Output 1840 ml  Net -400 ml     Physical Exam  Physical Exam: GENERAL: NAD. Walking in the halls HEENT: normal  NECK: Supple, JVP  5-6 .  2+ bilaterally, no bruits.  No lymphadenopathy or thyromegaly appreciated.   CARDIAC:  Mechanical heart sounds with LVAD hum present.  LUNGS:  Clear to auscultation bilaterally.  ABDOMEN:  Soft, round, nontender, positive bowel sounds x4.     LVAD exit site: Dressing intact.  EXTREMITIES:  Warm and dry, no cyanosis, clubbing, rash or edema  NEUROLOGIC:  Alert and oriented x 4.  Gait steady.  No aphasia.  No dysarthria.  Affect pleasant.      Telemetry   NSR 80-90 s  EKG    No new tracings.    Labs   Basic  Metabolic Panel: Recent Labs  Lab 12/04/18 0617 12/05/18 0251 12/06/18 0251 12/07/18 0247 12/08/18 0256  NA 139 142 142 141 140  K 4.0 3.9 4.1 4.1 4.0  CL 105 108 108 109 108  CO2 25 25 25 24 27   GLUCOSE 108* 119* 129* 123* 110*  BUN 14 12 13 12 12   CREATININE 0.88 0.94 0.89 0.94 0.84  CALCIUM 9.7 9.2 9.2 9.0 9.1    Liver Function Tests: No results for input(s): AST, ALT, ALKPHOS, BILITOT, PROT, ALBUMIN in the last 168 hours. No results for input(s): LIPASE, AMYLASE in the last 168 hours. No results for input(s): AMMONIA in the last 168 hours.  CBC: Recent Labs  Lab 12/04/18 0617 12/05/18 0251 12/06/18 0251 12/07/18 0247 12/08/18 0256  WBC 17.4* 8.8 8.5 8.5 7.1  HGB 10.0* 9.6* 9.5* 9.3* 9.2*  HCT 33.0* 32.5* 31.6* 31.2* 29.5*  MCV 80.1 81.5 81.0 80.2 80.4  PLT 410* 375 362 324 314    INR: Recent Labs  Lab 12/04/18 0617 12/05/18 0251 12/06/18 0251 12/07/18 0247 12/08/18 0256  INR 1.13 1.29 1.49 1.72 1.75    Other results:    Imaging   [place] Ekg Site Rite  Result Date: 12/08/2018 If Site Rite image not attached, placement could not be confirmed due to current cardiac rhythm.  [place]  Ekg Site Rite  Result Date: 12/08/2018 If Site Rite image not attached, placement could not be confirmed due to current cardiac rhythm.    Medications:     Scheduled Medications: . amLODipine  5 mg Oral Daily  . aspirin EC  81 mg Oral Daily  . atorvastatin  20 mg Oral Daily  . carbamide peroxide  5 drop Both EARS TID  . DULoxetine  60 mg Oral Daily  . feeding supplement (ENSURE ENLIVE)  237 mL Oral BID BM  . gabapentin  600 mg Oral TID  . insulin aspart  0-9 Units Subcutaneous TID WC  . metFORMIN  500 mg Oral BID WC  . multivitamin with minerals  1 tablet Oral Daily  . nystatin   Topical BID  . pantoprazole  40 mg Oral Daily  . sacubitril-valsartan  1 tablet Oral BID  . sodium chloride flush  10-40 mL Intracatheter Q12H  . spironolactone  25 mg Oral Daily  .  thiamine  100 mg Oral Daily  . vitamin B-12  1,000 mcg Oral Daily  . Warfarin - Pharmacist Dosing Inpatient   Does not apply q1800    Infusions: . sodium chloride Stopped (11/23/18 1702)  .  ceFAZolin (ANCEF) IV 2 g (12/09/18 1032)  . lactated ringers 10 mL/hr at 12/03/18 3220    PRN Medications: sodium chloride, acetaminophen, diphenhydrAMINE, fluticasone, HYDROmorphone (DILAUDID) injection, hydrOXYzine, ipratropium-albuterol, methocarbamol, ondansetron (ZOFRAN) IV, oxyCODONE, sodium chloride flush, traMADol   Patient Profile  58 yo with history of nonischemic cardiomyopathy, RLE DVT, cirrhosis, smoking, OSA, and HMIII LVAD 07/2018.   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  PICC line placed 11/26/2018. Transitioned to cefazolin per ID, will need prolonged course to heal.  Completed 5 day course of fluconazole on 11/28/18 for surrounding fungal infection.  CT with small amount of inflammation and fluid along the driveline. Blood CX NGTD.  Referred to [facility] for home antibiotics. Will continue for 24 days total from 11/26/2018.  Plan to continue until 12/21/2018. Place PICC.  Restart heparin later this morning.  Wound VAC now off. Continue current dressing. Plan to change next week.   2. Chronic systolic CHF s/p HM-III VAD: Nonischemic cardiomyopathy, now s/p Heartmate 3 LVAD in 9/19. VAD interrogated personally. Parameters stable.  Volume status stable off po lasix. MAPs ok.  Well compensated with VAD support. Volume status stable.  - INR pending.   - Continue spironolactone25mg  daily.  -ContinueEntresto 97/103 mg BID.  - Continue amlodipine 5 mg daily. - Continue ASA 81 daily.  3. Smoking: Still smoking a few cigs/day as outpatient. Using nicotine patches. Knows he needs to quit to be a transplant candidate. Encouraged cessation.  4. H/o RLE DVT:  Warfarin/heparin.  5. OSA: Continue  CPAP. No change.  6. Type II diabetes: Well-controlled. Continue sliding scale while inpatient. No change.   Referred to [facility] for home antibiotics.  He will follow up next week in the VAD clinic next week for Dr Maren Beach to change the dressing.   I spoke to [facility] Representative today with d/c update Anticipate d/c tomorrow.  I reviewed the LVAD parameters from today, and compared the results to the patient's prior recorded data.  No programming changes were made.  The LVAD is functioning within specified parameters.  The patient performs LVAD self-test daily.  LVAD interrogation was negative for any significant power changes, alarms or PI events/speed drops.  LVAD equipment  check completed and is in good working order.  Back-up equipment present.   LVAD education done on emergency procedures and precautions and reviewed exit site care.   Length of Stay: 16  Tonye Becket, NP 12/09/2018, 10:55 AM  VAD Team --- VAD ISSUES ONLY--- Pager [phone] (7am - 7am)  Advanced Heart Failure Team  Pager [phone] (M-F; 7a - 4p)  Please contact [facility] for night-coverage after hours (4p -7a ) and weekends on [email or web address]  Patient seen with NP, agree with the above note.    He had wound vac removed in the OR today and ACell applied.  He is having a PICC line placed today for home antibiotics.    Labs, vitals, and LVAD parameters reviewed and stable as above.    He should be ready for home tomorrow.   Marca Ancona 12/09/2018 3:42 PM

## 2018-12-09 NOTE — Progress Notes (Signed)
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 Shirlee Latch.   HM III LVAD implanted on 08/03/18 by Dr. Laneta Simmers under Destination Therapy criteria due to smoking status.  Vital signs: Temp:  97.5 HR: 96 Doppler Pressure:  84 Automatic BP:  94/75 (80) O2 Sat: 98% on RA Wt:  157>160>159.6>162.0>167.6>169.3>168.0>167.9>168.2>171.5 >164.7bs   LVAD interrogation reveals:  Speed: 5700 Flow: 4.7 Power:  4.4w PI: 3.2 Alarms: none Events:  9 PI events daily  Hematocrit: 31  Fixed speed: 5700 Low speed limit:  5400   Drive Line site/Periumbilical counterincision site: wound VAC in placed. CDI.    Labs:  LDH trend: 165>127>128>120>140>162>172>193>180>183>145>140>172  INR trend: 1.56>1.61>1.70>1.83>2.05>2.49>2.48>2.25>1.47>1.13>1.72>1.75>1.90  WBC: 7.6>17.4>7.1>6.7  Anticoagulation Plan: -INR Goal:  2.0 - 2.5  -ASA Dose:  81 mg daily - Coumadin re-started 11/24/18  Device: N/A - no ICD  Infection:  - BC's x 2 11/23/18>>negative; final - LVAD Wound culture 11/24/18>>staphylococcus aureus   Plan/Recommendations:  1. Contact VAD Coordinator for any VAD equipment or driveline issues. 2. Possible d/c home tomorrow per Dr. Marisue Brooklyn RN VAD Coordinator  Office: [phone]  24/7 Pager: [phone]

## 2018-12-09 NOTE — Brief Op Note (Signed)
12/09/2018  8:30 AM  PATIENT:  Theodore Welch  58 y.o. male  PRE-OPERATIVE DIAGNOSIS:  WOUND INFECTION  POST-OPERATIVE DIAGNOSIS:  WOUND INFECTION  PROCEDURE:  Procedure(s): Removal of Wound Vac, Application of ACELL (N/A) powder 500mg  and sheet Irrigation of wound  SURGEON:  Surgeon(s) and Role:    Kerin Perna, MD - Primary  PHYSICIAN ASSISTANT:   ASSISTANTS: none   ANESTHESIA:   general  EBL:  15 mL   BLOOD ADMINISTERED:none  DRAINS: none   LOCAL MEDICATIONS USED:  NONE  SPECIMEN:  No Specimen  DISPOSITION OF SPECIMEN:  N/A  COUNTS:  YES  TOURNIQUET:  * No tourniquets in log *  DICTATION: .Dragon Dictation  PLAN OF CARE: return to 2 C  PATIENT DISPOSITION:  PACU - hemodynamically stable.   Delay start of Pharmacological VTE agent (>24hrs) due to surgical blood loss or risk of bleeding: yes. Resume heparin 700 u/hr at noon 1-22 and donot increase, dose coumadin this pm   PLAN - DC tomorrow with iv Ancef via PICC , clinic dressing changes 3 times per week

## 2018-12-09 NOTE — Progress Notes (Signed)
VAD Coordinator Procedure Note:  VAD Coordinator met patient in holding room this am and received handoff from [facility] staff nurse. Pt undergoing VAD wound debridement with possible wound vac change per Dr. Darcey Nora. Hemodynamics and VAD parameters monitored by myself and anesthesia throughout the procedure. Blood pressures were obtained with automatic cuff on left arm and correlated with Doppler MAP.     Time: Doppler Auto  BP Flow PI Power Speed  Pre-procedure:           7:15 84 94/76 (80) 4.7 3.2 4.4 5700           Secation Induction:          7:40 90 104/92 (98) 4.7 3.3 4.4    7:50  82/63 (71) 5.1 1.9 4.4    8:00  86/69 (77) 5.0 2.0 4.4    8:10  87/70 (78) 4.9 2.1 4.9    8:20  83/71 (77) 4.8 3.3 4.4            Recovery Area:          8:35  112/93 (101) 4.8 3.1 4.5     Patient tolerated the procedure well. VAD Coordinator accompanied and remained with patient in recovery area.    Patient Disposition: [place] RN, VAD Coordinator [phone] 24/7 [place] Pager

## 2018-12-09 NOTE — Transfer of Care (Signed)
Immediate Anesthesia Transfer of Care Note  Patient: Theodore Welch  Procedure(s) Performed: Removal of Wound Vac, Application of ACELL (N/A Abdomen)  Patient Location: PACU  Anesthesia Type:General  Level of Consciousness: awake, alert  and oriented  Airway & Oxygen Therapy: Patient Spontanous Breathing and Patient connected to face mask oxygen  Post-op Assessment: Report given to RN and Post -op Vital signs reviewed and stable  Post vital signs: stable  Last Vitals:  Vitals Value Taken Time  BP 112/93 12/09/2018  8:33 AM  Temp    Pulse 83 12/09/2018  8:34 AM  Resp 19 12/09/2018  8:34 AM  SpO2 100 % 12/09/2018  8:34 AM  Vitals shown include unvalidated device data.  Last Pain:  Vitals:   12/09/18 0500  TempSrc: Oral  PainSc: 0-No pain      Patients Stated Pain Goal: 0 (12/03/18 1200)  Complications: No apparent anesthesia complications

## 2018-12-09 NOTE — Progress Notes (Signed)
Patient educated to have black emergency bag with him at all times, especially when he goes off-unit. Patient verbalized understanding.

## 2018-12-09 NOTE — Progress Notes (Addendum)
ANTICOAGULATION CONSULT NOTE - Follow-Up Consult  Pharmacy Consult for heparin + warfarin Indication: LVAD  No Known Allergies  Patient Measurements: Height: 5\' 10"  (177.8 cm) Weight: 164 lb 10.9 oz (74.7 kg) IBW/kg (Calculated) : 73 Heparin Dosing Weight: 73kg  Vital Signs: Temp: 97.3 F (36.3 C) (01/22 0855) Temp Source: Oral (01/22 0855) BP: 94/75 (01/22 0855) Pulse Rate: 84 (01/22 0855)  Labs: Recent Labs    12/07/18 0247 12/07/18 1319 12/08/18 0256  HGB 9.3*  --  9.2*  HCT 31.2*  --  29.5*  PLT 324  --  314  LABPROT 19.9*  --  20.2*  INR 1.72  --  1.75  HEPARINUNFRC <0.10* <0.10* <0.10*  CREATININE 0.94  --  0.84    Estimated Creatinine Clearance: 97.8 mL/min (by C-G formula based on SCr of 0.84 mg/dL).   Medical History: Past Medical History:  Diagnosis Date  . Cardiomyopathy, unspecified (HCC)   . CHF (congestive heart failure) (HCC)   . Chronic back pain   . Enlarged heart   . Gastric ulcer   . Gastroenteritis   . H/O degenerative disc disease     Assessment: 36 yoM with LVAD admitted with driveline infection. INR was subtherapeutic on admit so heparin started and warfarin held for now in case procedures are needed involving wound.   Warfarin initially resumed 1/7 but then held for OR 1/16 for drive line wound debridement. Discussed with Dr. Donata Clay - ok to resume warfarin 1/16 post op and will begin low-dose heparin infusion with no bolus - HL 1/17 < 0.1 and plans were to increase heparin to therapuetic dose with goal 0.3-0.4 - but oozing around driveline site.   Patient s/p wound vac change in OR this morning. OK to resume heparin warfarin this afternoon. Labs collection delayed d/t procedure. Will check now and depending on INR will add back heparin at low rate outlined by surgery.    *PTA Warfarin Dose = 7.5mg  Tues/Thurs/Sat/Sun, 5mg  Mon/Wed/Fri  Goal of Therapy:  INR 2-2.5 Monitor platelets by anticoagulation protocol: Yes   Plan:  INR  now Heparin to resume at 700 units/hr by surgery at noon today - no titration Plan for home tomorrow  Sheppard Coil PharmD., BCPS Clinical Pharmacist 12/09/2018 11:09 AM  Addendum:  INR 1.9 drawn after procedure. No heparin needed, D/w cardiology. Will give 7.5mg  of warfarin tonight in anticipation of d/c tomorrow.   12/09/2018 1:19 PM

## 2018-12-09 NOTE — Progress Notes (Signed)
Peripherally Inserted Central Catheter/Midline Placement  The IV Nurse has discussed with the patient and/or persons authorized to consent for the patient, the purpose of this procedure and the potential benefits and risks involved with this procedure.  The benefits include less needle sticks, lab draws from the catheter, and the patient may be discharged home with the catheter. Risks include, but not limited to, infection, bleeding, blood clot (thrombus formation), and puncture of an artery; nerve damage and irregular heartbeat and possibility to perform a PICC exchange if needed/ordered by physician.  Alternatives to this procedure were also discussed.  Bard Power PICC patient education guide, fact sheet on infection prevention and patient information card has been provided to patient /or left at bedside.    PICC/Midline Placement Documentation        Theodore Welch 12/09/2018, 4:23 PM

## 2018-12-09 NOTE — Progress Notes (Signed)
Called to room by patient, Tegaderm dressing covering abdominal incision was loose/peeling, ACell sheet intact underneath. Covered site with sterile gauze, called VAD coordinator who gave RN instructions for moist-to-dry sterile gauze dressing secured with tape. RN used sterile Surgilube to moisten site beneath moist-to-dry gauze. Anchor changed. Patient and family walked through dressing change steps.

## 2018-12-09 NOTE — Progress Notes (Signed)
Pre Procedure note for inpatients:   Theodore Welch has been scheduled for Procedure(s): WOUND VAC CHANGE (N/A) today. The various methods of treatment have been discussed with the patient. After consideration of the risks, benefits and treatment options the patient has consented to the planned procedure.   The patient has been seen and labs reviewed. There are no changes in the patient's condition to prevent proceeding with the planned procedure today.  Recent labs:  Lab Results  Component Value Date   WBC 7.1 12/08/2018   HGB 9.2 (L) 12/08/2018   HCT 29.5 (L) 12/08/2018   PLT 314 12/08/2018   GLUCOSE 110 (H) 12/08/2018   CHOL 97 07/21/2018   TRIG 39 07/21/2018   HDL 32 (L) 07/21/2018   LDLCALC 57 07/21/2018   ALT 12 10/28/2018   AST 22 10/28/2018   NA 140 12/08/2018   K 4.0 12/08/2018   CL 108 12/08/2018   CREATININE 0.84 12/08/2018   BUN 12 12/08/2018   CO2 27 12/08/2018   TSH 0.880 07/24/2018   INR 1.75 12/08/2018   HGBA1C 6.2 (H) 08/03/2018    [name], MD 12/09/2018 7:08 AM

## 2018-12-10 ENCOUNTER — Encounter (HOSPITAL_COMMUNITY): Payer: Self-pay | Admitting: Cardiothoracic Surgery

## 2018-12-10 LAB — CBC
HCT: 30.6 % — ABNORMAL LOW (ref 39.0–52.0)
Hemoglobin: 9.4 g/dL — ABNORMAL LOW (ref 13.0–17.0)
MCH: 25.1 pg — ABNORMAL LOW (ref 26.0–34.0)
MCHC: 30.7 g/dL (ref 30.0–36.0)
MCV: 81.6 fL (ref 80.0–100.0)
Platelets: 279 10*3/uL (ref 150–400)
RBC: 3.75 MIL/uL — ABNORMAL LOW (ref 4.22–5.81)
RDW: 16.2 % — ABNORMAL HIGH (ref 11.5–15.5)
WBC: 6.8 10*3/uL (ref 4.0–10.5)
nRBC: 0 % (ref 0.0–0.2)

## 2018-12-10 LAB — PROTIME-INR
INR: 2.13
PROTHROMBIN TIME: 23.5 s — AB (ref 11.4–15.2)

## 2018-12-10 LAB — BASIC METABOLIC PANEL
Anion gap: 6 (ref 5–15)
BUN: 10 mg/dL (ref 6–20)
CO2: 27 mmol/L (ref 22–32)
Calcium: 9 mg/dL (ref 8.9–10.3)
Chloride: 108 mmol/L (ref 98–111)
Creatinine, Ser: 0.81 mg/dL (ref 0.61–1.24)
GFR calc Af Amer: >60 mL/min (ref >60)
GFR calc non Af Amer: >60 mL/min (ref >60)
Glucose, Bld: 106 mg/dL — ABNORMAL HIGH (ref 70–99)
Potassium: 4.3 mmol/L (ref 3.5–5.1)
Sodium: 141 mmol/L (ref 135–145)

## 2018-12-10 LAB — GLUCOSE, CAPILLARY
Glucose-Capillary: 87 mg/dL (ref 70–99)
Glucose-Capillary: 116 mg/dL — ABNORMAL HIGH (ref 70–99)

## 2018-12-10 LAB — LACTATE DEHYDROGENASE: LDH: 154 U/L (ref 98–192)

## 2018-12-10 MED ORDER — OXYCODONE HCL 5 MG PO TABS: 5.0000 mg | ORAL_TABLET | Freq: Three times a day (TID) | ORAL | 0 refills | Status: DC | PRN

## 2018-12-10 MED ORDER — CEFAZOLIN IV (FOR PTA / DISCHARGE USE ONLY): 2.0000 g | Freq: Three times a day (TID) | INTRAVENOUS | 0 refills | Status: DC

## 2018-12-10 MED ORDER — ATORVASTATIN CALCIUM 20 MG PO TABS: 20.0000 mg | ORAL_TABLET | Freq: Every day | ORAL | 6 refills | Status: DC

## 2018-12-10 MED ORDER — CARBAMIDE PEROXIDE 6.5 % OT SOLN: 5.0000 [drp] | Freq: Three times a day (TID) | OTIC | 0 refills | Status: DC

## 2018-12-10 MED ORDER — HEPARIN SOD (PORK) LOCK FLUSH 100 UNIT/ML IV SOLN
250.0000 [IU] | INTRAVENOUS | Status: AC | PRN
  Administered 2018-12-10: 250 [IU]

## 2018-12-10 NOTE — Progress Notes (Signed)
Patient discharged home with caregiver. Discharge instructions and medications reviewed. Patient and caregiver verbalize understanding of wound care and dressing changes- declined reviewing and changing dressing at bedside. RN discussed PICC line care- patient declined to wait for afternoon dose of antibiotics and additional education- RN informed NP and home IV therapy. Patient will receive his remaining 2 doses of Ancef at home. PICC flushed and capped by IV team for home use.

## 2018-12-10 NOTE — Care Management Note (Signed)
Case Management Note  Patient Details  Name: Theodore Welch MRN: [id number] Date of Birth: 1961/06/26  Subjective/Objective:   Pt admitted with drive line infection of LVAD                 Action/Plan:   PTA Independent from home.  Pt in agreement with discharging home on IV antibiotics.  CM provided Medicare .gov HH list and choice for DME - pt in agreement with [facility] - agency contacted and referral accepted for both [facility] and DME.   Expected Discharge Date:  12/10/18               Expected Discharge Plan:  Home w Home Health Services  In-House Referral:     Discharge planning Services  CM Consult  Post Acute Care Choice:    Choice offered to:  Patient  DME Arranged:  IV pump/equipment DME Agency:  [facility]  HH Arranged:  RN [facility] Agency:  [facility]  Status of Service:  In process, will continue to follow  If discussed at Long Length of Stay Meetings, dates discussed:    Additional Comments: 12/10/2018  Pt to discharge home today in care of wife. Wife will perform dressing changes post discharge.  [facility] aware pt will discharge home today and are prepared for IV antibiotic home infusion  12/07/18 CVTS deferred DME preference to HF team - HF team in agreement to utilize [facility] - pt also in agreement.  [facility] contacted and tentative referral accepting pending order.  HF team will need to complete order form placed on shadow chart.  CM informed pt will need wound vac at discharge.  CM text paged attending to determine which wound vac DME agency is preferred - pt states no preference Cherylann Parr, RN 12/10/2018, 9:51 AM

## 2018-12-10 NOTE — Progress Notes (Addendum)
Patient ID: Theodore Welch, male   DOB: 11/02/1961, 58 y.o.   MRN: [id number]   Advanced Heart Failure VAD Team Note  PCP-Cardiologist: No primary care provider on file.   Subjective:    Wants to go home.    Wound CX- Staph aureus -->MSSA Blood CX- NGTD.    LVAD Interrogation HM 3: Speed: 5700 Flow: 4.6  PI: 3.3  Power: 4     Objective:    Vital Signs:   Temp:  [97.3 F (36.3 C)-98.3 F (36.8 C)] 98.1 F (36.7 C) (01/23 0753) Pulse Rate:  [83-100] 100 (01/23 0753) Resp:  [14-28] 18 (01/23 0753) BP: (82-112)/(71-90) 82/71 (01/23 0753) SpO2:  [98 %-100 %] 98 % (01/23 0753) Weight:  [75.1 kg] 75.1 kg (01/23 0316) Last BM Date: 12/09/18   Mean arterial Pressure 80s  Intake/Output:   Intake/Output Summary (Last 24 hours) at 12/10/2018 0839 Last data filed at 12/09/2018 2033 Gross per 24 hour  Intake 320 ml  Output 325 ml  Net -5 ml     Physical Exam   Physical Exam: GENERAL: Walking in the hall. NAD  HEENT: normal  NECK: Supple, JVP 5-6  .  2+ bilaterally, no bruits.  No lymphadenopathy or thyromegaly appreciated.   CARDIAC:  Mechanical heart sounds with LVAD hum present. Dressing change.   LUNGS:  Clear to auscultation bilaterally.  ABDOMEN:  Soft, round, nontender, positive bowel sounds x4.     LVAD exit site: Dressing intact.  EXTREMITIES:  Warm and dry, no cyanosis, clubbing, rash or edema  NEUROLOGIC:  Alert and oriented x 4.  Gait steady.  No aphasia.  No dysarthria.  Affect pleasant.      Telemetry   NSR 80-90 s  EKG    No new tracings.    Labs   Basic Metabolic Panel: Recent Labs  Lab 12/06/18 0251 12/07/18 0247 12/08/18 0256 12/09/18 1143 12/10/18 0606  NA 142 141 140 141 141  K 4.1 4.1 4.0 4.2 4.3  CL 108 109 108 109 108  CO2 25 24 27 23 27   GLUCOSE 129* 123* 110* 123* 106*  BUN 13 12 12 12 10   CREATININE 0.89 0.94 0.84 0.81 0.81  CALCIUM 9.2 9.0 9.1 9.0 9.0    Liver Function Tests: No results for input(s): AST, ALT, ALKPHOS,  BILITOT, PROT, ALBUMIN in the last 168 hours. No results for input(s): LIPASE, AMYLASE in the last 168 hours. No results for input(s): AMMONIA in the last 168 hours.  CBC: Recent Labs  Lab 12/06/18 0251 12/07/18 0247 12/08/18 0256 12/09/18 1143 12/10/18 0606  WBC 8.5 8.5 7.1 6.7 6.8  HGB 9.5* 9.3* 9.2* 9.3* 9.4*  HCT 31.6* 31.2* 29.5* 29.8* 30.6*  MCV 81.0 80.2 80.4 80.8 81.6  PLT 362 324 314 276 279    INR: Recent Labs  Lab 12/06/18 0251 12/07/18 0247 12/08/18 0256 12/09/18 1143 12/10/18 0606  INR 1.49 1.72 1.75 1.90 2.13    Other results:    Imaging   [place] Ekg Site Rite  Result Date: 12/08/2018 If Site Rite image not attached, placement could not be confirmed due to current cardiac rhythm.  [place] Ekg Site Rite  Result Date: 12/08/2018 If Site Rite image not attached, placement could not be confirmed due to current cardiac rhythm.    Medications:     Scheduled Medications: . amLODipine  5 mg Oral Daily  . aspirin EC  81 mg Oral Daily  . atorvastatin  20 mg Oral Daily  . carbamide peroxide  5 drop Both EARS TID  . DULoxetine  60 mg Oral Daily  . feeding supplement (ENSURE ENLIVE)  237 mL Oral BID BM  . gabapentin  600 mg Oral TID  . insulin aspart  0-9 Units Subcutaneous TID WC  . metFORMIN  500 mg Oral BID WC  . multivitamin with minerals  1 tablet Oral Daily  . nystatin   Topical BID  . pantoprazole  40 mg Oral Daily  . sacubitril-valsartan  1 tablet Oral BID  . sodium chloride flush  10-40 mL Intracatheter Q12H  . spironolactone  25 mg Oral Daily  . thiamine  100 mg Oral Daily  . vitamin B-12  1,000 mcg Oral Daily  . Warfarin - Pharmacist Dosing Inpatient   Does not apply q1800    Infusions: . sodium chloride Stopped (11/23/18 1702)  .  ceFAZolin (ANCEF) IV 2 g (12/10/18 0308)  . 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  PICC line placed 11/26/2018. Transitioned to cefazolin per ID, will need prolonged course to heal.  Completed 5 day course of fluconazole on 11/28/18 for surrounding fungal infection.  CT with small amount of inflammation and fluid along the driveline. Blood CX NGTD.  Referred to [facility] for home antibiotics. Will continue for 24 days total from 11/26/2018.  Plan to continue until 12/21/2018. Placed PICC.  - Stop heparin drip.   - Wound VAC now off. Continue current dressing. Plan to change next week in the VAD clinic.    2. Chronic systolic CHF s/p HM-III VAD: Nonischemic cardiomyopathy, now s/p Heartmate 3 LVAD in 9/19. VAD interrogated personally. Parameters stable.  Volume status stable off po lasix. MAPs ok.  Well compensated with VAD support. Volume stable.  INR stable.  - Continue spironolactone25mg  daily.  -ContinueEntresto 97/103 mg BID.  - Continue amlodipine 5 mg daily. - Continue ASA 81 daily.  3. Smoking: Still smoking a few cigs/day as outpatient. Using nicotine patches. Knows he needs to quit to be a transplant candidate. Encouraged cessation.  4. H/o RLE DVT:  Warfarin/heparin.  5. OSA: Continue CPAP. No change.  6. Type II diabetes: Well-controlled. Continue sliding scale while inpatient. No change.  Home today with [facility].    I  reviewed the LVAD parameters from today, and compared the results to the patient's prior recorded data.  No programming changes were made.  The LVAD is functioning within specified parameters.  The patient performs LVAD self-test daily.  LVAD interrogation was negative for  any significant power changes, alarms or PI events/speed drops.  LVAD equipment check completed and is in good working order.  Back-up equipment present.   LVAD education done on emergency procedures and precautions and reviewed exit site care.   Length of Stay: 17  Tonye Becket, NP 12/10/2018, 8:39 AM  VAD Team --- VAD ISSUES ONLY--- Pager [phone] (7am - 7am)  Advanced Heart Failure Team  Pager [phone] (M-F; 7a - 4p)  Please contact [facility] for night-coverage after hours (4p -7a ) and weekends on [email or web address]  Patient seen with NP, agree with the above note.  He is doing well today, ready to go home. INR therapeutic, wound looks ok.  He will continue cefazolin at home until 2/3.  Followup in VAD clinic and with ID.   Theodore Welch 12/10/2018 8:44 AM

## 2018-12-10 NOTE — Care Management Important Message (Signed)
Important Message  Patient Details  Name: Theodore Welch MRN: [id number] Date of Birth: 04-07-1961   Medicare Important Message Given:  Yes    Kjerstin Abrigo P Georgeanna Radziewicz 12/10/2018, 11:06 AM

## 2018-12-10 NOTE — Progress Notes (Signed)
ANTICOAGULATION CONSULT NOTE - Follow-Up Consult  Pharmacy Consult for warfarin Indication: LVAD  No Known Allergies  Patient Measurements: Height: 5\' 10"  (177.8 cm) Weight: 165 lb 9.1 oz (75.1 kg) IBW/kg (Calculated) : 73 Heparin Dosing Weight: 73kg  Vital Signs: Temp: 98.1 F (36.7 C) (01/23 0305) Temp Source: Oral (01/23 0305) BP: 109/80 (01/23 0305)  Labs: Recent Labs    12/07/18 1319  12/08/18 0256 12/09/18 1143 12/10/18 0606  HGB  --    < > 9.2* 9.3* 9.4*  HCT  --   --  29.5* 29.8* 30.6*  PLT  --   --  314 276 279  LABPROT  --   --  20.2* 21.6* 23.5*  INR  --   --  1.75 1.90 2.13  HEPARINUNFRC <0.10*  --  <0.10* <0.10*  --   CREATININE  --   --  0.84 0.81 0.81   < > = values in this interval not displayed.    Estimated Creatinine Clearance: 101.4 mL/min (by C-G formula based on SCr of 0.81 mg/dL).   Medical History: Past Medical History:  Diagnosis Date  . Cardiomyopathy, unspecified (HCC)   . CHF (congestive heart failure) (HCC)   . Chronic back pain   . Enlarged heart   . Gastric ulcer   . Gastroenteritis   . H/O degenerative disc disease     Assessment: 47 yoM with LVAD admitted with driveline infection. INR was subtherapeutic on admit so heparin started and warfarin held for now in case procedures are needed involving wound.   Warfarin initially resumed 1/7 but then held for OR 1/16 for drive line wound debridement. Discussed with Dr. Donata Clay - ok to resume warfarin 1/16 post op and will begin low-dose heparin infusion with no bolus - HL 1/17 < 0.1 and plans were to increase heparin to therapuetic dose with goal 0.3-0.4 - but oozing around driveline site.   Patient s/p wound vac change in OR 1/22. Heparin not needed postop with INR of 1.9, now up to 2.1. CBC stable, possible d/c later today. LDH stable.    *PTA Warfarin Dose = 7.5mg  Tues/Thurs/Sat/Sun, 5mg  Mon/Wed/Fri  Goal of Therapy:  INR 2-2.5 Monitor platelets by anticoagulation protocol:  Yes   Plan:  Resume home dose of warfarin if discharged today Will plan on rechecking INR next week - 1/29 which is his scheduled follow up in clinic  Sheppard Coil PharmD., BCPS Clinical Pharmacist 12/10/2018 7:28 AM

## 2018-12-10 NOTE — Discharge Instructions (Signed)
PICC Home Care Guide ° °A peripherally inserted central catheter (PICC) is a form of IV access that allows medicines and IV fluids to be quickly distributed throughout the body. The PICC is a long, thin, flexible tube (catheter) that is inserted into a vein in the upper arm. The catheter ends in a large vein in the chest (superior vena cava, or SVC). After the PICC is inserted, a chest X-ray may be done to make sure that it is in the correct place. °A PICC may be placed for different reasons, such as: °· To give medicines and liquid nutrition. °· To give IV fluids and blood products. °· If there is trouble placing a peripheral intravenous (PIV) catheter. °If taken care of properly, a PICC can remain in place for several months. Having a PICC can also allow a person to go home from the hospital sooner. Medicine and PICC care can be managed at home by a family member, caregiver, or home health care team. °What are the risks? °Generally, having a PICC is safe. However, problems may occur, including: °· A blood clot (thrombus) forming in or at the tip of the PICC. °· A blood clot forming in a vein (deep vein thrombosis) or traveling to the lung (pulmonary embolism). °· Inflammation of the vein (phlebitis) in which the PICC is placed. °· Infection. Central line associated blood stream infection (CLABSI) is a serious infection that often requires hospitalization. °· PICC movement (malposition). The PICC tip may move from its original position due to excessive physical activity, forceful coughing, sneezing, or vomiting. °· A break or cut in the PICC. It is important not to use scissors near the PICC. °· Nerve or tendon irritation or injury during PICC insertion. °How to take care of your PICC °Preventing problems °· You and any caregivers should wash your hands often with soap. Wash hands: °? Before touching the PICC line or the infusion device. °? Before changing a bandage (dressing). °· Flush the PICC as told by your  health care provider. Let your health care provider know right away if the PICC is hard to flush or does not flush. Do not use force to flush the PICC. °· Do not use a syringe that is less than 10 mL to flush the PICC. °· Avoid blood pressure checks on the arm in which the PICC is placed. °· Never pull or tug on the PICC. °· Do not take the PICC out yourself. Only a trained clinical professional should remove the PICC. °· Use clean and sterile supplies only. Keep the supplies in a dry place. Do not reuse needles, syringes, or any other supplies. Doing that can lead to infection. °· Keep pets and children away from your PICC line. °· Check the PICC insertion site every day for signs of infection. Check for: °? Leakage. °? Redness, swelling, or pain. °? Fluid or blood. °? Warmth. °? Pus or a bad smell. °PICC dressing care °· Keep your PICC bandage (dressing) clean and dry to prevent infection. °· Do not take baths, swim, or use a hot tub until your health care provider approves. Ask your health care provider if you can take showers. You may only be allowed to take sponge baths for bathing. When you are allowed to shower: °? Ask your health care provider to teach you how to wrap the PICC line. °? Cover the PICC line with clear plastic wrap and tape to keep it dry while showering. °· Follow instructions from your health care provider   about how to take care of your insertion site and dressing. Make sure you: °? Wash your hands with soap and water before you change your bandage (dressing). If soap and water are not available, use hand sanitizer. °? Change your dressing as told by your health care provider. °? Leave stitches (sutures), skin glue, or adhesive strips in place. These skin closures may need to stay in place for 2 weeks or longer. If adhesive strip edges start to loosen and curl up, you may trim the loose edges. Do not remove adhesive strips completely unless your health care provider tells you to do  that. °· Change your PICC dressing if it becomes loose or wet. °General instructions ° °· Carry your PICC identification card or wear a medical alert bracelet at all times. °· Keep the tube clamped at all times, unless it is being used. °· Carry a smooth-edge clamp with you at all times to place on the tube if it breaks. °· Do not use scissors or sharp objects near the tube. °· You may bend your arm and move it freely. If your PICC is near or at the bend of your elbow, avoid activity with repeated motion at the elbow. °· Avoid lifting heavy objects as told by your health care provider. °· Keep all follow-up visits as told by your health care provider. This is important. °Disposal of supplies °· Throw away any syringes in a disposal container that is meant for sharp items (sharps container). You can buy a sharps container from a pharmacy, or you can make one by using an empty hard plastic bottle with a cover. °· Place any used dressings or infusion bags into a plastic bag. Throw that bag in the trash. °Contact a health care provider if: °· You have pain in your arm, ear, face, or teeth. °· You have a fever or chills. °· You have redness, swelling, or pain around the insertion site. °· You have fluid or blood coming from the insertion site. °· Your insertion site feels warm to the touch. °· You have pus or a bad smell coming from the insertion site. °· Your skin feels hard and raised around the insertion site. °Get help right away if: °· Your PICC is accidentally pulled all the way out. If this happens, cover the insertion site with a bandage or gauze dressing. Do not throw the PICC away. Your health care provider will need to check it. °· Your PICC was tugged or pulled and has partially come out. Do not  push the PICC back in. °· You cannot flush the PICC, it is hard to flush, or the PICC leaks around the insertion site when it is flushed. °· You hear a "flushing" sound when the PICC is flushed. °· You feel your  heart racing or skipping beats. °· There is a hole or tear in the PICC. °· You have swelling in the arm in which the PICC was inserted. °· You have a red streak going up your arm from where the PICC was inserted. °Summary °· A peripherally inserted central catheter (PICC) is a long, thin, flexible tube (catheter) that is inserted into a vein in the upper arm. °· The PICC is inserted using a sterile technique by a specially trained nurse or physician. Only a trained clinical professional should remove it. °· Keep your PICC identification card with you at all times. °· Avoid blood pressure checks on the arm in which the PICC is placed. °· If cared for   properly, a PICC can remain in place for several months. Having a PICC can also allow a person to go home from the hospital sooner. °This information is not intended to replace advice given to you by your health care provider. Make sure you discuss any questions you have with your health care provider. °Document Released: 05/11/2003 Document Revised: 12/07/2016 Document Reviewed: 12/07/2016 °Elsevier Interactive Patient Education © 2019 [employer] ° °

## 2018-12-11 ENCOUNTER — Inpatient Hospital Stay (HOSPITAL_COMMUNITY): Admission: RE | Admit: 2018-12-11 | Payer: Medicare Other | Source: Ambulatory Visit

## 2018-12-11 ENCOUNTER — Telehealth (HOSPITAL_COMMUNITY): Payer: Self-pay | Admitting: Unknown Physician Specialty

## 2018-12-11 ENCOUNTER — Ambulatory Visit (HOSPITAL_COMMUNITY): Payer: Medicare Other

## 2018-12-11 ENCOUNTER — Telehealth (HOSPITAL_COMMUNITY): Payer: Self-pay

## 2018-12-11 NOTE — Telephone Encounter (Signed)
Called pt per Dr. Maren Beach and re-scheduled pt's hospital d/c f/u from next Wed to Tues at 8:00 am. Informed him wife does not need to do dressing change Monday as previously instructed, we will change Tues at clinic visit. Pt verbalized understanding of above.

## 2018-12-11 NOTE — Telephone Encounter (Signed)
[employer] denied PA approval for entresto as patient has Medicare.  PA submitted through [facility]. Waiting for response

## 2018-12-14 ENCOUNTER — Ambulatory Visit (HOSPITAL_COMMUNITY): Payer: Medicare Other

## 2018-12-14 ENCOUNTER — Other Ambulatory Visit (HOSPITAL_COMMUNITY): Payer: Self-pay | Admitting: Unknown Physician Specialty

## 2018-12-14 DIAGNOSIS — Z95811 Presence of heart assist device: Secondary | ICD-10-CM

## 2018-12-14 DIAGNOSIS — Z7901 Long term (current) use of anticoagulants: Secondary | ICD-10-CM

## 2018-12-15 ENCOUNTER — Ambulatory Visit (HOSPITAL_COMMUNITY): Payer: Self-pay | Admitting: Pharmacist

## 2018-12-15 ENCOUNTER — Ambulatory Visit (HOSPITAL_COMMUNITY)
Admission: RE | Admit: 2018-12-15 | Discharge: 2018-12-15 | Disposition: A | Discharge status: Home / Self Care | Payer: Medicare HMO | Source: Ambulatory Visit | Attending: Cardiology | Admitting: Cardiology

## 2018-12-15 VITALS — BP 98/0 | HR 95 | Ht 70.0 in | Wt 176.0 lb

## 2018-12-15 DIAGNOSIS — L089 Local infection of the skin and subcutaneous tissue, unspecified: Secondary | ICD-10-CM

## 2018-12-15 DIAGNOSIS — Z95811 Presence of heart assist device: Secondary | ICD-10-CM | POA: Insufficient documentation

## 2018-12-15 DIAGNOSIS — G8929 Other chronic pain: Secondary | ICD-10-CM

## 2018-12-15 DIAGNOSIS — T148XXA Other injury of unspecified body region, initial encounter: Secondary | ICD-10-CM

## 2018-12-15 DIAGNOSIS — T827XXA Infection and inflammatory reaction due to other cardiac and vascular devices, implants and grafts, initial encounter: Secondary | ICD-10-CM

## 2018-12-15 DIAGNOSIS — Z7901 Long term (current) use of anticoagulants: Secondary | ICD-10-CM | POA: Insufficient documentation

## 2018-12-15 DIAGNOSIS — T8189XD Other complications of procedures, not elsewhere classified, subsequent encounter: Secondary | ICD-10-CM

## 2018-12-15 DIAGNOSIS — G8918 Other acute postprocedural pain: Secondary | ICD-10-CM

## 2018-12-15 LAB — CBC
HCT: 33.4 % — ABNORMAL LOW (ref 39.0–52.0)
HEMOGLOBIN: 10.2 g/dL — AB (ref 13.0–17.0)
MCH: 25.1 pg — ABNORMAL LOW (ref 26.0–34.0)
MCHC: 30.5 g/dL (ref 30.0–36.0)
MCV: 82.3 fL (ref 80.0–100.0)
Platelets: 260 10*3/uL (ref 150–400)
RBC: 4.06 MIL/uL — ABNORMAL LOW (ref 4.22–5.81)
RDW: 16.4 % — ABNORMAL HIGH (ref 11.5–15.5)
WBC: 10 10*3/uL (ref 4.0–10.5)
nRBC: 0 % (ref 0.0–0.2)

## 2018-12-15 LAB — BASIC METABOLIC PANEL
Anion gap: 9 (ref 5–15)
BUN: 13 mg/dL (ref 6–20)
CO2: 23 mmol/L (ref 22–32)
Calcium: 9 mg/dL (ref 8.9–10.3)
Chloride: 106 mmol/L (ref 98–111)
Creatinine, Ser: 0.83 mg/dL (ref 0.61–1.24)
GFR calc Af Amer: >60 mL/min (ref >60)
GFR calc non Af Amer: >60 mL/min (ref >60)
Glucose, Bld: 187 mg/dL — ABNORMAL HIGH (ref 70–99)
Potassium: 4.4 mmol/L (ref 3.5–5.1)
Sodium: 138 mmol/L (ref 135–145)

## 2018-12-15 LAB — PROTIME-INR
INR: 1.4
Prothrombin Time: 17 seconds — ABNORMAL HIGH (ref 11.4–15.2)

## 2018-12-15 LAB — LACTATE DEHYDROGENASE: LDH: 186 U/L (ref 98–192)

## 2018-12-15 MED ORDER — ZINC GLUCONATE 100 MG PO TABS: 100.0000 mg | ORAL_TABLET | Freq: Every day | ORAL | 3 refills | Status: AC

## 2018-12-15 MED ORDER — OXYCODONE HCL 5 MG PO TABS: ORAL_TABLET | ORAL | 0 refills | Status: DC

## 2018-12-15 MED ORDER — ENOXAPARIN SODIUM 40 MG/0.4ML ~~LOC~~ SOLN: 40.0000 mg | Freq: Two times a day (BID) | SUBCUTANEOUS | 1 refills | Status: DC

## 2018-12-15 NOTE — Progress Notes (Signed)
Patient presents for hospital follow up in VAD Clinic today with significant other [name]. Reports no problems with VAD equipment or concerns with drive line. Denies any falls.   Vital Signs:  Doppler Pressure:  98 Automat[employer] BP:  91/79 (85) HR: 95 SPO2: 100%  Weight: 176 lb w/o eqt Last weight: 165.2 lb Home weights: 157 - 164 lbs   VAD Indication: Destination Therapy due to smoking status  VAD interrogation & Equipment Management: Speed: 5700 Flow: 4.7 Power:  4.5w    PI: 3.0 Hct: 30  Alarms: no clinical alarms Events:  10-15 PI events  Fixed speed: 5700 Low speed limit: 5400  Primary Controller:  Replace back up battery in 28 months. Back up controller:   Replace back up battery in 28 months.  Annual Equipment Maintenance on UBC/PM was performed on 07/2018.   I reviewed the LVAD parameters from today and compared the results to the patient's prior recorded data. LVAD interrogation was NEGATIVE for significant power changes, NEGATIVE for clinical alarms and STABLE for PI events/speed drops. No programming changes were made and pump is functioning within specified parameters. Pt is performing daily controller and system monitor self tests along with completing weekly and monthly maintenance for LVAD equipment.  LVAD equipment check completed and is in good working order. Back-up equipment present.   Exit Site Care: Drive line is being maintained every other day by [name]. Drive line exit site cleaned with sterile saline only; PVT replaced sorbact with Mepitel sheet, applied sterile gel, saline saturated gauze, dry gauze and occlusive tape. Pt given a box of gauze, and sterile gel.    BP & Labs: Doppler 98 and is reflective of Modified systolic.   Hgb 10.2 - No S/S of bleeding. Specifically denies melena/BRBPR or nosebleeds.  LDH stable at 186 with established baseline of 175 - 255. Denies tea-colored urine. No power elevations noted on interrogation.     Patient Instructions: 1. Start Zinc gluconate 100 mg daily for increased wound healing. 2. Take pain meds prior to clinic visit next week. 3. Change dressing Thurs, Sat, Mon. 4. Take 2 tablets of Coumadin tonight and then resume a dose of 1 and 1/2 pills daily. 5. Start Lovenox 40 mg injections every 12 hours. 6. Return to VAD clinic next Wed at 8:00 am and Friday 1/31 for INR recheck.   [name], RN VAD Coordinator  Office: [phone]  24/7 Pager: [phone]

## 2018-12-15 NOTE — Patient Instructions (Addendum)
1. Start Zinc gluconate 100 mg daily for increased wound healing. 2. Take pain meds prior to clinic visit next week. 3. Change dressing Thurs, Sat, Mon. 4. Take 2 tablets of Coumadin tonight and then resume a dose of 1 and 1/2 pills daily.  5. Return to VAD clinic next Wed at 8:00 am.

## 2018-12-16 ENCOUNTER — Encounter (HOSPITAL_COMMUNITY): Payer: Medicare HMO

## 2018-12-16 ENCOUNTER — Ambulatory Visit (HOSPITAL_COMMUNITY): Payer: Medicare Other

## 2018-12-16 MED FILL — ENOXAPARIN 80 MG/0.8 ML SYR: 80 | 5 days supply | Qty: 8 | Fill #1

## 2018-12-16 NOTE — Telephone Encounter (Signed)
[facility] faxed signed orders to continue home health services per MD Shirlee Latch, faxed to [phone] attn Francetta Found RN

## 2018-12-17 ENCOUNTER — Other Ambulatory Visit (HOSPITAL_COMMUNITY): Payer: Self-pay | Admitting: Unknown Physician Specialty

## 2018-12-17 DIAGNOSIS — Z95811 Presence of heart assist device: Secondary | ICD-10-CM

## 2018-12-17 DIAGNOSIS — Z7901 Long term (current) use of anticoagulants: Secondary | ICD-10-CM

## 2018-12-18 ENCOUNTER — Ambulatory Visit (HOSPITAL_COMMUNITY)
Admission: RE | Admit: 2018-12-18 | Discharge: 2018-12-18 | Disposition: A | Discharge status: Home / Self Care | Payer: Medicare HMO | Source: Ambulatory Visit | Attending: Cardiology | Admitting: Cardiology

## 2018-12-18 ENCOUNTER — Ambulatory Visit (HOSPITAL_COMMUNITY): Payer: Medicare Other

## 2018-12-18 ENCOUNTER — Ambulatory Visit (HOSPITAL_COMMUNITY): Payer: Self-pay | Admitting: Pharmacist

## 2018-12-18 DIAGNOSIS — Z7901 Long term (current) use of anticoagulants: Secondary | ICD-10-CM | POA: Diagnosis not present

## 2018-12-18 DIAGNOSIS — Z95811 Presence of heart assist device: Secondary | ICD-10-CM

## 2018-12-18 LAB — PROTIME-INR
INR: 2
Prothrombin Time: 22.5 seconds — ABNORMAL HIGH (ref 11.4–15.2)

## 2018-12-18 MED ORDER — FREESTYLE LANCETS MISC: 12 refills | Status: DC

## 2018-12-18 MED ORDER — GLUCOSE BLOOD VI STRP: ORAL_STRIP | 12 refills | Status: DC

## 2018-12-18 NOTE — Progress Notes (Signed)
Pt presented to clinic today for labs. [name] states that the "white sheet" is coming off of the wound and ask VAD coordinator to change.  Exit Site Care: Drive line is being maintained every other day by [name]. Old Mepitel sheet was removed. Drive line exit site cleaned with betadine only; Mepitel sheet applied, sterile gel, saline saturated gauze, dry gauze and occlusive tape.   Will return Wednesday for wound change w/PVT.   [name] RN, BSN VAD Coordinator 24/7 Pager [phone]

## 2018-12-18 NOTE — Addendum Note (Signed)
Encounter addended by: Lebron Quam, RN on: 12/18/2018 3:05 PM  Actions taken: Order list changed

## 2018-12-18 NOTE — Addendum Note (Signed)
Encounter addended by: Lebron Quam, RN on: 12/18/2018 1:52 PM  Actions taken: Clinical Note Signed

## 2018-12-21 ENCOUNTER — Encounter: Payer: Self-pay | Admitting: Internal Medicine

## 2018-12-21 ENCOUNTER — Ambulatory Visit (HOSPITAL_COMMUNITY): Payer: Medicare Other

## 2018-12-21 ENCOUNTER — Ambulatory Visit (HOSPITAL_COMMUNITY)
Admission: RE | Admit: 2018-12-21 | Discharge: 2018-12-21 | Disposition: A | Discharge status: Home / Self Care | Payer: Medicare HMO | Source: Ambulatory Visit | Attending: Internal Medicine | Admitting: Internal Medicine

## 2018-12-21 ENCOUNTER — Ambulatory Visit (INDEPENDENT_AMBULATORY_CARE_PROVIDER_SITE_OTHER): Payer: Medicare HMO | Admitting: Internal Medicine

## 2018-12-21 ENCOUNTER — Telehealth: Payer: Self-pay

## 2018-12-21 DIAGNOSIS — B9561 Methicillin susceptible Staphylococcus aureus infection as the cause of diseases classified elsewhere: Secondary | ICD-10-CM | POA: Diagnosis not present

## 2018-12-21 DIAGNOSIS — Z87891 Personal history of nicotine dependence: Secondary | ICD-10-CM

## 2018-12-21 DIAGNOSIS — Z95811 Presence of heart assist device: Secondary | ICD-10-CM | POA: Diagnosis not present

## 2018-12-21 DIAGNOSIS — T827XXA Infection and inflammatory reaction due to other cardiac and vascular devices, implants and grafts, initial encounter: Secondary | ICD-10-CM

## 2018-12-21 DIAGNOSIS — Z95828 Presence of other vascular implants and grafts: Secondary | ICD-10-CM

## 2018-12-21 DIAGNOSIS — T827XXD Infection and inflammatory reaction due to other cardiac and vascular devices, implants and grafts, subsequent encounter: Secondary | ICD-10-CM | POA: Diagnosis not present

## 2018-12-21 DIAGNOSIS — Z792 Long term (current) use of antibiotics: Secondary | ICD-10-CM

## 2018-12-21 MED ORDER — CEPHALEXIN 500 MG PO CAPS: 500.0000 mg | ORAL_CAPSULE | Freq: Three times a day (TID) | ORAL | 11 refills | Status: DC

## 2018-12-21 NOTE — Progress Notes (Signed)
[facility]  Patient Active Problem List   Diagnosis Date Noted  . Infection associated with driveline of left ventricular assist device (LVAD) (HCC) 11/28/2018    Priority: High  . Chronic systolic heart failure (HCC) 11/28/2018  . Complication involving left ventricular assist device (LVAD) 11/23/2018  . OSA (obstructive sleep apnea) 09/02/2018  . LVAD (left ventricular assist device) present (HCC) 08/03/2018  . DVT (deep venous thrombosis) (HCC) 08/02/2018  . Goals of care, counseling/discussion   . Advance care planning   . Palliative care encounter   . Malnutrition of moderate degree 07/25/2018  . Ascites   . COPD (chronic obstructive pulmonary disease) (HCC) 07/21/2018  . HTN (hypertension) 07/21/2018  . Tobacco use 07/21/2018  . Congestive heart failure (HCC) 07/21/2018  . CHF (congestive heart failure) (HCC) 07/21/2018  . Type 2 diabetes mellitus (HCC) 07/21/2018  . Hyperlipidemia 07/21/2018  . Fibromyalgia syndrome 01/27/2017  . Myofascial pain syndrome, cervical 01/27/2017  . Lumbar radiculopathy 01/24/2014  . History of lumbar laminectomy for spinal cord decompression 12/07/2013  . 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    Patient's Medications  New Prescriptions   CEPHALEXIN (KEFLEX) 500 MG CAPSULE    Take 1 capsule (500 mg total) by mouth 3 (three) times daily.  Previous Medications   AMLODIPINE (NORVASC) 5 MG TABLET    Take 1 tablet (5 mg total) by mouth daily.   ASPIRIN EC 81 MG TABLET    Take 81 mg by mouth daily.   ATORVASTATIN (LIPITOR) 20 MG TABLET    Take 1 tablet (20 mg total) by mouth daily.   CARBAMIDE PEROXIDE (DEBROX) 6.5 % OTIC SOLUTION    Place 5 drops into both ears 3 (three) times daily.   CYANOCOBALAMIN 500 MCG TABLET    Take 1,000 mcg by mouth daily.    DOCUSATE SODIUM (COLACE) 100 MG CAPSULE    Take 2 capsules (200 mg total) by mouth daily as needed for mild constipation.     DULOXETINE (CYMBALTA) 60 MG CAPSULE    Take 60 mg by mouth daily.   ENOXAPARIN (LOVENOX) 40 MG/0.4ML INJECTION    Inject 0.4 mLs (40 mg total) into the skin every 12 (twelve) hours for 10 doses.   FLUTICASONE (FLONASE) 50 MCG/ACT NASAL SPRAY    Place 1 spray into both nostrils 2 (two) times daily as needed for allergies.    FUROSEMIDE (LASIX) 40 MG TABLET    Take 0.5 tablets (20 mg total) by mouth as needed.   GABAPENTIN (NEURONTIN) 300 MG CAPSULE    Take 600 mg by mouth 3 (three) times daily.   GLUCOSE BLOOD (CONTOUR NEXT TEST) TEST STRIP    Use as instructed   HYDROXYZINE (VISTARIL) 25 MG CAPSULE    Take 25 mg by mouth 3 (three) times daily as needed for anxiety.   IPRATROPIUM-ALBUTEROL (COMBIVENT) 20-100 MCG/ACT AERS RESPIMAT    Inhale 1 puff into the lungs every 6 (six) hours.   LANCETS (FREESTYLE) LANCETS    Use as instructed   METFORMIN (GLUCOPHAGE-XR) 500 MG 24 HR TABLET    Take 500 mg by mouth 2 (two) times daily.   METHOCARBAMOL (ROBAXIN) 750 MG TABLET    Take 750 mg by mouth 3 (three) times daily as needed for muscle spasms.    NICOTINE (NICODERM CQ - DOSED IN MG/24 HR) 7 MG/24HR PATCH    Place 1 patch (7 mg total) onto the skin daily  as needed (tobacco craving).   NYSTATIN (MYCOSTATIN/NYSTOP) POWDER    Use with VAD dressing changes daily, advance as directed.   OMEPRAZOLE (PRILOSEC) 20 MG CAPSULE    Take 20 mg by mouth daily.   OXYCODONE (OXY IR/ROXICODONE) 5 MG IMMEDIATE RELEASE TABLET    Take 1 - 2 tabs q 4 - 6 hrs prn pain   POLYETHYLENE GLYCOL (MIRALAX / GLYCOLAX) PACKET    Take 17 g by mouth daily as needed for mild constipation.   SACUBITRIL-VALSARTAN (ENTRESTO) 97-103 MG    Take 1 tablet by mouth 2 (two) times daily.   SILDENAFIL (VIAGRA) 100 MG TABLET    Take 50 mg by mouth daily as needed for erectile dysfunction.   SPIRONOLACTONE (ALDACTONE) 25 MG TABLET    Take 1 tablet (25 mg total) by mouth daily.   THIAMINE 100 MG TABLET    Take 100 mg by mouth daily.   TRAMADOL  (ULTRAM) 50 MG TABLET    Take 1 tablet (50 mg total) by mouth every 4 (four) hours as needed for moderate pain.   WARFARIN (COUMADIN) 5 MG TABLET    Take 2 tablets tonight and the resume a dose of 1 and 1/2 tablets (7.5 mg) daily    ZINC GLUCONATE 100 MG TABS    Take 1 tablet (100 mg total) by mouth daily.  Modified Medications   No medications on file  Discontinued Medications   CEFAZOLIN (ANCEF) IVPB    Inject 2 g into the vein every 8 (eight) hours for 25 days. Indication: LVAD-associated MSSA infection Last Day of Therapy:  12/21/2018 Labs - Once weekly:  CBC/D and BMP    Subjective: Theodore Welch is in with his significant other, [name], for his hospital follow-up visit.  He had an LVAD placed last year and recently developed some spontaneous drainage from the previous site of his mid abdomen counterincision.  It was opened and noted to travel down to the rectus sheath but initially did not appear to involve the driveline or device.  Cultures grew MSSA.  CT scan showed a small amount of inflammation and ill-defined fluid along the driveline in the anterior abdominal wall.  He was treated with IV cefazolin.  Vantrigt debrided the area on 12/03/2018.  He found that there was a space from the exit site of the power cord up to the mid abdominal incision.  Cultures again grew MSSA.  [name] told me that yesterday the sore back covering over the wound came off.  She redressed it with gauze.  He has not had any fever.  He has tolerated his PICC and cefazolin well.  He has now completed 4 weeks of therapy.  Review of Systems: Review of Systems  Constitutional: Negative for chills, diaphoresis and fever.  Gastrointestinal: Negative for abdominal pain, diarrhea, nausea and vomiting.  Skin: Negative for rash.    Past Medical History:  Diagnosis Date  . Cardiomyopathy, unspecified (HCC)   . CHF (congestive heart failure) (HCC)   . Chronic back pain   . Enlarged heart   . Gastric ulcer   . Gastroenteritis    . H/O degenerative disc disease     Social History   Tobacco Use  . Smoking status: Former Smoker    Types: Cigarettes    Start date: 2003  . Smokeless tobacco: Never Used  . Tobacco comment: Nicoderm patch   Substance Use Topics  . Alcohol use: Not Currently  . Drug use: Not Currently    No family history  on file.  No Known Allergies  Objective: Vitals:   12/21/18 0956  Weight: 180 lb (81.6 kg)   Body mass index is 25.83 kg/m.  Physical Exam Constitutional:      Comments: He is in good spirits.  Cardiovascular:     Comments: LVAD hum present. Pulmonary:     Effort: Pulmonary effort is normal.     Breath sounds: Normal breath sounds.  Abdominal:     Comments: He has a clean dry gauze dressing over his abdominal wound and exit site.     Lab Results    Problem List Items Addressed This Visit      High   Infection associated with driveline of left ventricular assist device (LVAD) (HCC)    Given probable involvement of his device with his MSSA infection we will keep him on therapy but switch from IV cefazolin to oral cephalexin now.  I will need to review optimal management with Dr. Donata ClayVan Trigt.  I will have his PICC removed.  He will follow-up here in 6 weeks.      Relevant Medications   cephALEXin (KEFLEX) 500 MG capsule       Cliffton AstersJohn Athenia Rys, MD [facility] [phone] pager   [phone] cell 12/21/2018, 10:29 AM

## 2018-12-21 NOTE — Progress Notes (Addendum)
Pt presented to clinic today for dressing change. [name] states that the "white sheet" was pulled off by patient during sleep last night and she found in trash can. She says she did dressing change last night and everything looked "good".  Exit Site Care: Drive line is being maintained every other day by [name]. Old Mepitel sheet was removed. Drive line exit site cleaned with betadine only; Mepitel sheet applied, sterile gel, saline saturated gauze, dry gauze and occlusive tape.   Will return Wednesday for wound change w/PVT.  Ordered VAD abdominal binder for patient; will give to patient as soon as it arrives. Pt verbalized understanding of same.   Johnny Bridge, BSN VAD Coordinator 24/7 Pager [phone]

## 2018-12-21 NOTE — Assessment & Plan Note (Signed)
Given probable involvement of his device with his MSSA infection we will keep him on therapy but switch from IV cefazolin to oral cephalexin now.  I will need to review optimal management with Dr. Donata Clay.  I will have his PICC removed.  He will follow-up here in 6 weeks.

## 2018-12-21 NOTE — Telephone Encounter (Signed)
Per Dr. Orvan Falconer called [facility] with verbal order to stop Cefazolin and pull picc line. Spoke with Melissa, who was able to take the verbal order. Will inform Home health nurse to pull picc line today. Lorenso Courier, [place]

## 2018-12-22 ENCOUNTER — Other Ambulatory Visit (HOSPITAL_COMMUNITY): Payer: Self-pay | Admitting: Unknown Physician Specialty

## 2018-12-22 MED ORDER — BLOOD GLUCOSE METER KIT: PACK | 0 refills | Status: DC

## 2018-12-23 ENCOUNTER — Ambulatory Visit (HOSPITAL_COMMUNITY): Payer: Medicare Other

## 2018-12-23 ENCOUNTER — Ambulatory Visit (HOSPITAL_COMMUNITY): Payer: Self-pay | Admitting: Pharmacist

## 2018-12-23 ENCOUNTER — Ambulatory Visit (HOSPITAL_COMMUNITY)
Admission: RE | Admit: 2018-12-23 | Discharge: 2018-12-23 | Disposition: A | Discharge status: Home / Self Care | Payer: Medicare HMO | Source: Ambulatory Visit | Attending: Cardiology | Admitting: Cardiology

## 2018-12-23 ENCOUNTER — Other Ambulatory Visit (HOSPITAL_COMMUNITY): Payer: Self-pay | Admitting: *Deleted

## 2018-12-23 ENCOUNTER — Encounter (HOSPITAL_COMMUNITY): Payer: Medicare HMO

## 2018-12-23 ENCOUNTER — Encounter (HOSPITAL_COMMUNITY): Payer: Self-pay

## 2018-12-23 ENCOUNTER — Ambulatory Visit (HOSPITAL_COMMUNITY): Payer: Medicare HMO

## 2018-12-23 DIAGNOSIS — I5021 Acute systolic (congestive) heart failure: Secondary | ICD-10-CM

## 2018-12-23 DIAGNOSIS — Z95811 Presence of heart assist device: Secondary | ICD-10-CM

## 2018-12-23 DIAGNOSIS — Z7901 Long term (current) use of anticoagulants: Secondary | ICD-10-CM | POA: Insufficient documentation

## 2018-12-23 LAB — BASIC METABOLIC PANEL
Anion gap: 12 (ref 5–15)
BUN: 15 mg/dL (ref 6–20)
CALCIUM: 9.1 mg/dL (ref 8.9–10.3)
CO2: 22 mmol/L (ref 22–32)
Chloride: 105 mmol/L (ref 98–111)
Creatinine, Ser: 0.9 mg/dL (ref 0.61–1.24)
GFR calc Af Amer: >60 mL/min (ref >60)
GFR calc non Af Amer: >60 mL/min (ref >60)
Glucose, Bld: 106 mg/dL — ABNORMAL HIGH (ref 70–99)
Potassium: 4.5 mmol/L (ref 3.5–5.1)
Sodium: 139 mmol/L (ref 135–145)

## 2018-12-23 LAB — CBC
HCT: 35.4 % — ABNORMAL LOW (ref 39.0–52.0)
Hemoglobin: 10.9 g/dL — ABNORMAL LOW (ref 13.0–17.0)
MCH: 24.5 pg — ABNORMAL LOW (ref 26.0–34.0)
MCHC: 30.8 g/dL (ref 30.0–36.0)
MCV: 79.6 fL — AB (ref 80.0–100.0)
PLATELETS: 292 10*3/uL (ref 150–400)
RBC: 4.45 MIL/uL (ref 4.22–5.81)
RDW: 16.7 % — ABNORMAL HIGH (ref 11.5–15.5)
WBC: 10 10*3/uL (ref 4.0–10.5)
nRBC: 0 % (ref 0.0–0.2)

## 2018-12-23 LAB — LACTATE DEHYDROGENASE: LDH: 195 U/L — ABNORMAL HIGH (ref 98–192)

## 2018-12-23 LAB — PROTIME-INR
INR: 2.35
PROTHROMBIN TIME: 25.4 s — AB (ref 11.4–15.2)

## 2018-12-23 NOTE — Progress Notes (Signed)
Patient presents for one week f/u with wound check per Dr. Donata Clay in VAD Clinic today with significant other [facility]. Reports no problems with VAD equipment or concerns with drive line. Denies any falls.   [name] reports dressing is still intact from Monday clinic visit for dressing change. Pt still is very active during sleep at night and she fears he will pull drive line/dressing during restless sleep. Provided patient with abdominal binder to try wearing at night to decrease risk of drive line trauma. Pt agrees to try.  Pt saw Dr. Orvan Falconer on 12/21/18 and had IV antibiotics (Cefazolin) stopped; PICC line d/c'd, and PO antibiotics Keflex 500 mg tid started. Pt confirms he is currently taking as prescribed.  Dr. Donata Clay in to assess wound and change dressing - see below.  Vital Signs:  Doppler Pressure:  98 Automatc BP:  99/83 (88) HR: 101 SPO2: 98%  Weight: 175.4 lb w/o eqt Last weight: 176  lb   VAD Indication: Destination Therapy due to smoking status  VAD interrogation & Equipment Management: Speed: 5700 Flow: 4.9 Power:  4.4w    PI: 3.1 Hct: 33  Alarms: no clinical alarms Events:  10-15 PI events  Fixed speed: 5700 Low speed limit: 5400  Primary Controller:  Replace back up battery in 28 months. Back up controller:   Replace back up battery in 28 months.  Annual Equipment Maintenance on UBC/PM was performed on 07/2018.   I reviewed the LVAD parameters from today and compared the results to the patient's prior recorded data. LVAD interrogation was NEGATIVE for significant power changes, NEGATIVE for clinical alarms and STABLE for PI events/speed drops. No programming changes were made and pump is functioning within specified parameters. Pt is performing daily controller and system monitor self tests along with completing weekly and monthly maintenance for LVAD equipment.  LVAD equipment check completed and is in good working order. Back-up equipment present.    Exit Site Care: Drive line is being maintained every other day by [name]. Existing dressing removed using sterile technique per Dr. Donata Clay. Site cleansed with betadine swab.Mepitel sheet replaced after adding Acell powder to wound, , applied sterile gel, saline saturated gauze, dry gauze and occlusive tape. Pt given a box of gauze, and sterile gel, betadine swabs, 8 daily dressing kits, and 4 anchors. Instructed to continue every other day dressing changes.    BP & Labs: Doppler 98 and is reflective of Modified systolic.   Hgb 10.9 - No S/S of bleeding. Specifically denies melena/BRBPR or nosebleeds.  LDH stable at 195 with established baseline of 175 - 255. Denies tea-colored urine. No power elevations noted on interrogation.   Patient Instructions: 1. No change in meds. 2. [name], PharmD will call you with INR and warfarin instructions. 3. Will call you if any abnormal labs. 4. Continue every other day dressing changes to VAD drive line: cover Mepitel sheet with sterile gel, saline soaked gauze, then cover with dry gauze and occlusive tape. Cleanse area with betadine (CHG allergy).  5. Wear abdominal binder at night to protect drive line site from trauma. 6. Return to VAD clinic in one week for wound check with Dr. Donata Clay.   Hessie Diener, RN VAD Coordinator  Office: [phone]  24/7 Pager: [phone]

## 2018-12-23 NOTE — Addendum Note (Signed)
Encounter addended by: Levonne Spiller, RN on: 12/23/2018 3:34 PM  Actions taken: Vitals modified, Home Medications modified, Medication taking status modified, Order Reconciliation Section accessed, Medication List reviewed, Clinical Note Signed

## 2018-12-24 NOTE — Addendum Note (Signed)
Encounter addended by: Robyne Peers, RN on: 12/24/2018 3:21 PM  Actions taken: Visit Navigator Flowsheet section accepted, Clinical Note Signed, Episode resolved

## 2018-12-24 NOTE — Progress Notes (Signed)
Discharge Progress Report  Patient Details  Name: Theodore Welch MRN: [id number] Date of Birth: [birth year] Referring Provider:   Flowsheet Row CARDIAC REHAB PHASE II ORIENTATION from 11/17/2018 in [facility]  Referring Provider  Dr. Shirlee Latch       Number of Visits: 1   Reason for Discharge:  Early Exit:  Lack of attendance; hospitalization   Smoking History:  Social History   Tobacco Use  Smoking Status Former Smoker  . Types: Cigarettes  . Start date: 2003  Smokeless Tobacco Never Used  Tobacco Comment   Nicoderm patch     Diagnosis:  08/03/2018 LVAD (left ventricular assist device) present (HCC)  2019 Chronic systolic congestive heart failure (HCC)  ADL [facility]:   Initial Exercise Prescription: Initial Exercise Prescription - 11/17/18 1000    Date of Initial Exercise RX and Referring Provider          Date  11/17/18    Referring Provider  Dr. Shirlee Latch    Expected Discharge Date  02/26/19        NuStep          Level  3    SPM  85    Minutes  10    METs  3.6        Arm Ergometer          Level  1    Watts  47    Minutes  10    METs  4.3        Track          Laps  11    Minutes  10    METs  2.91        Prescription Details          Frequency (times per week)  3    Duration  Progress to 30 minutes of continuous aerobic without signs/symptoms of physical distress        Intensity          THRR 40-80% of Max Heartrate  64-129    Ratings of Perceived Exertion  11-13        Progression          Progression  Continue to progress workloads to maintain intensity without signs/symptoms of physical distress.        Resistance Training          Training Prescription  Yes    Weight  3 lbs.    Reps  10-15           Discharge Exercise Prescription (Final Exercise Prescription Changes):   Functional Capacity: 6 Minute Walk    6 Minute Walk    Row Name 11/17/18 1043   Phase  Initial   Distance  1485 feet    Walk Time  6 minutes   # of Rest Breaks  0   MPH  2.8   METS  3.73   RPE  11   VO2 Peak  13.06   Symptoms  No   Resting HR  84 bpm   Resting BP  78/0   Resting Oxygen Saturation   99 %   Max Ex. HR  123 bpm   Max Ex. BP  82/0   2 Minute Post BP  80/0          Psychological, QOL, Others - Outcomes: PHQ 2/9: Depression screen [facility] 2/9 04/22/2017 01/27/2017  Decreased Interest 0 0  Down, Depressed, Hopeless 0 2  PHQ - 2 Score  0 2  Altered sleeping - 3  Tired, decreased energy - 1  Change in appetite - 3  Feeling bad or failure about yourself  - 0  Trouble concentrating - 1  Moving slowly or fidgety/restless - 0  Suicidal thoughts - 0  PHQ-9 Score - 10  Difficult doing work/chores - Somewhat difficult    Quality of Life: Quality of Life - 11/17/18 1025    Quality of Life          Select  Quality of Life        Quality of Life Scores          Health/Function Pre  30 %    Socioeconomic Pre  30 %    Psych/Spiritual Pre  30 %    Family Pre  30 %    GLOBAL Pre  30 %           Personal Goals: Goals established at orientation with interventions provided to work toward goal. Personal Goals and Risk Factors at Admission - 11/17/18 1049    Core Components/Risk Factors/Patient Goals on Admission           Weight Management  Yes;Weight Maintenance    Intervention  Weight Management: Develop a combined nutrition and exercise program designed to reach desired caloric intake, while maintaining appropriate intake of nutrient and fiber, sodium and fats, and appropriate energy expenditure required for the weight goal.;Weight Management: Provide education and appropriate resources to help participant work on and attain dietary goals.    Admit Weight  166 lb 3.6 oz (75.4 kg)    Expected Outcomes  Short Term: Continue to assess and modify interventions until short term weight is achieved;Long Term: Adherence to nutrition and physical activity/exercise program aimed toward  attainment of established weight goal;Weight Maintenance: Understanding of the daily nutrition guidelines, which includes 25-35% calories from fat, 7% or less cal from saturated fats, less than 200mg  cholesterol, less than 1.5gm of sodium, & 5 or more servings of fruits and vegetables daily;Understanding recommendations for meals to include 15-35% energy as protein, 25-35% energy from fat, 35-60% energy from carbohydrates, less than 200mg  of dietary cholesterol, 20-35 gm of total fiber daily;Understanding of distribution of calorie intake throughout the day with the consumption of 4-5 meals/snacks    Diabetes  Yes    Intervention  Provide education about signs/symptoms and action to take for hypo/hyperglycemia.;Provide education about proper nutrition, including hydration, and aerobic/resistive exercise prescription along with prescribed medications to achieve blood glucose in normal ranges: Fasting glucose 65-99 mg/dL    Expected Outcomes  Short Term: Participant verbalizes understanding of the signs/symptoms and immediate care of hyper/hypoglycemia, proper foot care and importance of medication, aerobic/resistive exercise and nutrition plan for blood glucose control.;Long Term: Attainment of HbA1C < 7%.    Heart Failure  Yes    Intervention  Provide a combined exercise and nutrition program that is supplemented with education, support and counseling about heart failure. Directed toward relieving symptoms such as shortness of breath, decreased exercise tolerance, and extremity edema.    Expected Outcomes  Improve functional capacity of life;Short term: Attendance in program 2-3 days a week with increased exercise capacity. Reported lower sodium intake. Reported increased fruit and vegetable intake. Reports medication compliance.;Short term: Daily weights obtained and reported for increase. Utilizing diuretic protocols set by physician.;Long term: Adoption of self-care skills and reduction of barriers for  early signs and symptoms recognition and intervention leading to self-care maintenance.    Hypertension  Yes  Intervention  Provide education on lifestyle modifcations including regular physical activity/exercise, weight management, moderate sodium restriction and increased consumption of fresh fruit, vegetables, and low fat dairy, alcohol moderation, and smoking cessation.;Monitor prescription use compliance.    Expected Outcomes  Long Term: Maintenance of blood pressure at goal levels.;Short Term: Continued assessment and intervention until BP is < 140/6990mm HG in hypertensive participants. < 130/4480mm HG in hypertensive participants with diabetes, heart failure or chronic kidney disease.    Lipids  Yes    Intervention  Provide education and support for participant on nutrition & aerobic/resistive exercise along with prescribed medications to achieve LDL 70mg , HDL >40mg .    Expected Outcomes  Short Term: Participant states understanding of desired cholesterol values and is compliant with medications prescribed. Participant is following exercise prescription and nutrition guidelines.;Long Term: Cholesterol controlled with medications as prescribed, with individualized exercise RX and with personalized nutrition plan. Value goals: LDL < 70mg , HDL > 40 mg.    Stress  Yes    Intervention  Offer individual and/or small group education and counseling on adjustment to heart disease, stress management and health-related lifestyle change. Teach and support self-help strategies.;Refer participants experiencing significant psychosocial distress to appropriate mental health specialists for further evaluation and treatment. When possible, include family members and significant others in education/counseling sessions.    Expected Outcomes  Short Term: Participant demonstrates changes in health-related behavior, relaxation and other stress management skills, ability to obtain effective social support, and compliance  with psychotropic medications if prescribed.;Long Term: Emotional wellbeing is indicated by absence of clinically significant psychosocial distress or social isolation.            Personal Goals Discharge:   Exercise Goals and Review: Exercise Goals    Exercise Goals    Row Name 11/17/18 1047   Increase Physical Activity  Yes   Intervention  Provide advice, education, support and counseling about physical activity/exercise needs.;Develop an individualized exercise prescription for aerobic and resistive training based on initial evaluation findings, risk stratification, comorbidities and participant's personal goals.   Expected Outcomes  Short Term: Attend rehab on a regular basis to increase amount of physical activity.   Increase Strength and Stamina  Yes   Intervention  Provide advice, education, support and counseling about physical activity/exercise needs.;Develop an individualized exercise prescription for aerobic and resistive training based on initial evaluation findings, risk stratification, comorbidities and participant's personal goals.   Expected Outcomes  Short Term: Increase workloads from initial exercise prescription for resistance, speed, and METs.;Short Term: Perform resistance training exercises routinely during rehab and add in resistance training at home;Long Term: Improve cardiorespiratory fitness, muscular endurance and strength as measured by increased METs and functional capacity (6MWT)   Able to understand and use rate of perceived exertion (RPE) scale  Yes   Intervention  Provide education and explanation on how to use RPE scale   Expected Outcomes  Short Term: Able to use RPE daily in rehab to express subjective intensity level;Long Term:  Able to use RPE to guide intensity level when exercising independently   Knowledge and understanding of Target Heart Rate Range (THRR)  Yes   Intervention  Provide education and explanation of THRR including how the numbers were  predicted and where they are located for reference   Expected Outcomes  Short Term: Able to state/look up THRR;Long Term: Able to use THRR to govern intensity when exercising independently;Short Term: Able to use daily as guideline for intensity in rehab   Able to check pulse  independently  Yes   Intervention  Provide education and demonstration on how to check pulse in carotid and radial arteries.;Review the importance of being able to check your own pulse for safety during independent exercise   Expected Outcomes  Short Term: Able to explain why pulse checking is important during independent exercise;Long Term: Able to check pulse independently and accurately   Understanding of Exercise Prescription  Yes   Intervention  Provide education, explanation, and written materials on patient's individual exercise prescription   Expected Outcomes  Short Term: Able to explain program exercise prescription;Long Term: Able to explain home exercise prescription to exercise independently          Exercise Goals Re-Evaluation:   Nutrition & Weight - Outcomes: Pre Biometrics - 11/17/18 1047    Pre Biometrics          Height  5' 7.75" (1.721 m)    Weight  75.4 kg    Waist Circumference  37 inches    Hip Circumference  39.5 inches    Waist to Hip Ratio  0.94 %    BMI (Calculated)  25.46    Triceps Skinfold  18 mm    % Body Fat  25.8 %    Grip Strength  42 kg    Flexibility  0 in    Single Leg Stand  30 seconds            Nutrition: Nutrition Therapy & Goals - 11/17/18 1208    Nutrition Therapy          Diet  heart healthy, carb modified        Personal Nutrition Goals          Nutrition Goal  Pt to identify and limit food sources of saturated fat, trans fat, refined carbohydrates and sodium    Personal Goal #2  Pt to watch out for sweets and added sugar    Personal Goal #3  Pt able to name foods that affect blood glucose.        Intervention Plan          Intervention  Prescribe,  educate and counsel regarding individualized specific dietary modifications aiming towards targeted core components such as weight, hypertension, lipid management, diabetes, heart failure and other comorbidities.    Expected Outcomes  Short Term Goal: Understand basic principles of dietary content, such as calories, fat, sodium, cholesterol and nutrients.;Long Term Goal: Adherence to prescribed nutrition plan.           Nutrition Discharge: Nutrition Assessments - 11/17/18 1537    MEDFICTS Scores          Pre Score  43           Education Questionnaire Score: Knowledge Questionnaire Score - 11/17/18 1026    Knowledge Questionnaire Score          Pre Score  25/28           Goals reviewed with patient; copy given to patient.

## 2018-12-25 ENCOUNTER — Ambulatory Visit (HOSPITAL_COMMUNITY): Payer: Medicare Other

## 2018-12-25 ENCOUNTER — Telehealth (HOSPITAL_COMMUNITY): Payer: Self-pay | Admitting: Unknown Physician Specialty

## 2018-12-25 MED ORDER — OSELTAMIVIR PHOSPHATE 75 MG PO CAPS: 75.0000 mg | ORAL_CAPSULE | Freq: Every day | ORAL | 0 refills | Status: AC

## 2018-12-25 NOTE — Telephone Encounter (Signed)
Pt stopped by the office to inform us that his wife was in the ER and has been diagnosed with the flu. The pt was worried he may get the flu too. Per Otilio Saber pt was started on Tamiflu 75 mg daily for 10 days.   [name] RN, BSN VAD Coordinator 24/7 Pager [phone]

## 2018-12-28 ENCOUNTER — Ambulatory Visit (HOSPITAL_COMMUNITY): Payer: Medicare Other

## 2018-12-29 ENCOUNTER — Other Ambulatory Visit (HOSPITAL_COMMUNITY): Payer: Self-pay | Admitting: *Deleted

## 2018-12-29 DIAGNOSIS — Z95811 Presence of heart assist device: Secondary | ICD-10-CM

## 2018-12-29 DIAGNOSIS — Z7901 Long term (current) use of anticoagulants: Secondary | ICD-10-CM

## 2018-12-30 ENCOUNTER — Ambulatory Visit (HOSPITAL_COMMUNITY): Payer: Self-pay | Admitting: Pharmacist

## 2018-12-30 ENCOUNTER — Ambulatory Visit (HOSPITAL_COMMUNITY)
Admission: RE | Admit: 2018-12-30 | Discharge: 2018-12-30 | Disposition: A | Discharge status: Home / Self Care | Payer: Medicare HMO | Source: Ambulatory Visit | Attending: Internal Medicine | Admitting: Internal Medicine

## 2018-12-30 ENCOUNTER — Telehealth (HOSPITAL_COMMUNITY): Payer: Self-pay

## 2018-12-30 ENCOUNTER — Ambulatory Visit (HOSPITAL_COMMUNITY): Payer: Medicare Other

## 2018-12-30 ENCOUNTER — Other Ambulatory Visit (HOSPITAL_COMMUNITY): Payer: Medicare HMO

## 2018-12-30 ENCOUNTER — Encounter (HOSPITAL_COMMUNITY): Payer: Self-pay

## 2018-12-30 DIAGNOSIS — Z7901 Long term (current) use of anticoagulants: Secondary | ICD-10-CM

## 2018-12-30 DIAGNOSIS — Z95811 Presence of heart assist device: Secondary | ICD-10-CM | POA: Insufficient documentation

## 2018-12-30 LAB — CBC
HCT: 37 % — ABNORMAL LOW (ref 39.0–52.0)
Hemoglobin: 10.9 g/dL — ABNORMAL LOW (ref 13.0–17.0)
MCH: 23.7 pg — ABNORMAL LOW (ref 26.0–34.0)
MCHC: 29.5 g/dL — ABNORMAL LOW (ref 30.0–36.0)
MCV: 80.4 fL (ref 80.0–100.0)
Platelets: 299 10*3/uL (ref 150–400)
RBC: 4.6 MIL/uL (ref 4.22–5.81)
RDW: 17.1 % — ABNORMAL HIGH (ref 11.5–15.5)
WBC: 7.8 10*3/uL (ref 4.0–10.5)
nRBC: 0 % (ref 0.0–0.2)

## 2018-12-30 LAB — BASIC METABOLIC PANEL
Anion gap: 10 (ref 5–15)
BUN: 15 mg/dL (ref 6–20)
CO2: 22 mmol/L (ref 22–32)
Calcium: 9.1 mg/dL (ref 8.9–10.3)
Chloride: 106 mmol/L (ref 98–111)
Creatinine, Ser: 1 mg/dL (ref 0.61–1.24)
GFR calc Af Amer: >60 mL/min (ref >60)
GFR calc non Af Amer: >60 mL/min (ref >60)
Glucose, Bld: 113 mg/dL — ABNORMAL HIGH (ref 70–99)
POTASSIUM: 4.6 mmol/L (ref 3.5–5.1)
Sodium: 138 mmol/L (ref 135–145)

## 2018-12-30 LAB — LACTATE DEHYDROGENASE: LDH: 199 U/L — ABNORMAL HIGH (ref 98–192)

## 2018-12-30 LAB — PROTIME-INR
INR: 2.99
Prothrombin Time: 30.6 seconds — ABNORMAL HIGH (ref 11.4–15.2)

## 2018-12-30 MED ORDER — SACUBITRIL-VALSARTAN 49-51 MG PO TABS: 1.0000 | ORAL_TABLET | Freq: Two times a day (BID) | ORAL | 3 refills | Status: DC

## 2018-12-30 NOTE — Patient Instructions (Signed)
1. Decrease Entresto to 49-51mg  BID.  2. Return to VAD clinic in 1 week for wound check and labs

## 2018-12-30 NOTE — Progress Notes (Signed)
Patient presents for one week f/u with wound check per Dr. Donata Clay in VAD Clinic today with significant other [facility]. Reports no problems with VAD equipment or concerns with drive line. Denies any falls.   Patient reports that he has been wearing his abdominal binder while sleeping. "It is working really well."    [employer] rep in to assist with assessment of wound and change dressing - see below.  Vital Signs:  Temp: 96.3 Doppler Pressure:  102 Automatc BP:  103/72 (83) HR: 104 SPO2: 98%  Weight: 178.4 lb w/o eqt Last weight: 175.4  lb   VAD Indication: Destination Therapy due to smoking status  VAD interrogation & Equipment Management: Speed: 5700 Flow: 4.7 Power:  4.4w    PI: 3.4 Hct: 33  Alarms: no clinical alarms Events:  80+ PI events daily (previously 10-15 daily) Moderate amount are true suction events.  Fixed speed: 5700 Low speed limit: 5400  Primary Controller:  Replace back up battery in 28 months. Back up controller:   Replace back up battery in 28 months.  Annual Equipment Maintenance on UBC/PM was performed on 07/2018.   I reviewed the LVAD parameters from today and compared the results to the patient's prior recorded data. LVAD interrogation was NEGATIVE for significant power changes, NEGATIVE for clinical alarms and POSITIVE for PI events/speed drops. No programming changes were made and pump is functioning within specified parameters. Pt is performing daily controller and system monitor self tests along with completing weekly and monthly maintenance for LVAD equipment.  LVAD equipment check completed and is in good working order. Back-up equipment present.   Exit Site Care: Drive line is being maintained every other day by [name]. Existing dressing removed using sterile technique. Site cleansed with betadine swab x 2. Mepitel sheet replaced after adding 200mg  Acell powder to wound, applied sterile gel, saline saturated gauze, dry gauze and  occlusive tape. Pt has adequate dressing supplies at home. Instructed to continue every other day dressing changes.          BP & Labs: Doppler 102 and is reflective of Modified systolic.   Hgb 10.9 - No S/S of bleeding. Specifically denies melena/BRBPR or nosebleeds.  LDH stable at 199 with established baseline of 175 - 255. Denies tea-colored urine. No power elevations noted on interrogation.   Patient Instructions: 1. Decrease Entresto to 49-51mg  BID  2. [name], PharmD will call you with INR and warfarin instructions. 3. Will call you if any abnormal labs. 4. Continue every other day dressing changes to VAD drive line: cover Mepitel sheet with sterile gel, saline soaked gauze, then cover with dry gauze and occlusive tape. Cleanse area with betadine (CHG allergy).  5. Wear abdominal binder at night to protect drive line site from trauma. 6. Return to VAD clinic in one week for wound check with Dr. Donata Clay.   Alyce Pagan RN VAD Coordinator  Office: [phone]  24/7 Pager: [phone]

## 2018-12-30 NOTE — Addendum Note (Signed)
Encounter addended by: Bernita Raisin, RN on: 12/30/2018 3:27 PM  Actions taken: Clinical Note Signed

## 2018-12-30 NOTE — Telephone Encounter (Signed)
HH cert and plan of care faxed

## 2019-01-01 ENCOUNTER — Ambulatory Visit (HOSPITAL_COMMUNITY): Payer: Medicare Other

## 2019-01-04 ENCOUNTER — Ambulatory Visit (HOSPITAL_COMMUNITY): Payer: Medicare Other

## 2019-01-05 ENCOUNTER — Other Ambulatory Visit (HOSPITAL_COMMUNITY): Payer: Self-pay | Admitting: Unknown Physician Specialty

## 2019-01-05 DIAGNOSIS — Z7901 Long term (current) use of anticoagulants: Secondary | ICD-10-CM

## 2019-01-05 DIAGNOSIS — Z95811 Presence of heart assist device: Secondary | ICD-10-CM

## 2019-01-06 ENCOUNTER — Ambulatory Visit (HOSPITAL_COMMUNITY): Payer: Self-pay | Admitting: Pharmacist

## 2019-01-06 ENCOUNTER — Ambulatory Visit (HOSPITAL_COMMUNITY): Payer: Medicare Other

## 2019-01-06 ENCOUNTER — Ambulatory Visit (HOSPITAL_COMMUNITY)
Admission: RE | Admit: 2019-01-06 | Discharge: 2019-01-06 | Disposition: A | Discharge status: Home / Self Care | Payer: Medicare HMO | Source: Ambulatory Visit | Attending: Cardiology | Admitting: Cardiology

## 2019-01-06 ENCOUNTER — Encounter (HOSPITAL_COMMUNITY): Payer: Self-pay

## 2019-01-06 VITALS — BP 100/65 | HR 92 | Wt 178.9 lb

## 2019-01-06 DIAGNOSIS — Z7901 Long term (current) use of anticoagulants: Secondary | ICD-10-CM | POA: Diagnosis not present

## 2019-01-06 DIAGNOSIS — F17210 Nicotine dependence, cigarettes, uncomplicated: Secondary | ICD-10-CM | POA: Diagnosis not present

## 2019-01-06 DIAGNOSIS — T827XXA Infection and inflammatory reaction due to other cardiac and vascular devices, implants and grafts, initial encounter: Secondary | ICD-10-CM

## 2019-01-06 DIAGNOSIS — Z95811 Presence of heart assist device: Secondary | ICD-10-CM

## 2019-01-06 LAB — BASIC METABOLIC PANEL
Anion gap: 9 (ref 5–15)
BUN: 12 mg/dL (ref 6–20)
CHLORIDE: 111 mmol/L (ref 98–111)
CO2: 19 mmol/L — ABNORMAL LOW (ref 22–32)
Calcium: 8.9 mg/dL (ref 8.9–10.3)
Creatinine, Ser: 0.79 mg/dL (ref 0.61–1.24)
GFR calc Af Amer: >60 mL/min (ref >60)
GFR calc non Af Amer: >60 mL/min (ref >60)
Glucose, Bld: 87 mg/dL (ref 70–99)
POTASSIUM: 3.9 mmol/L (ref 3.5–5.1)
Sodium: 139 mmol/L (ref 135–145)

## 2019-01-06 LAB — CBC
HCT: 33.8 % — ABNORMAL LOW (ref 39.0–52.0)
Hemoglobin: 10.4 g/dL — ABNORMAL LOW (ref 13.0–17.0)
MCH: 25 pg — ABNORMAL LOW (ref 26.0–34.0)
MCHC: 30.8 g/dL (ref 30.0–36.0)
MCV: 81.3 fL (ref 80.0–100.0)
NRBC: 0 % (ref 0.0–0.2)
Platelets: 281 10*3/uL (ref 150–400)
RBC: 4.16 MIL/uL — ABNORMAL LOW (ref 4.22–5.81)
RDW: 17.4 % — ABNORMAL HIGH (ref 11.5–15.5)
WBC: 9.5 10*3/uL (ref 4.0–10.5)

## 2019-01-06 LAB — PROTIME-INR
INR: 2.18
Prothrombin Time: 24 seconds — ABNORMAL HIGH (ref 11.4–15.2)

## 2019-01-06 LAB — LACTATE DEHYDROGENASE: LDH: 196 U/L — AB (ref 98–192)

## 2019-01-06 NOTE — Addendum Note (Signed)
Encounter addended by: Bernita Raisin, RN on: 01/06/2019 2:28 PM  Actions taken: Clinical Note Signed

## 2019-01-06 NOTE — Progress Notes (Signed)
Patient presents for one week f/u with wound check per Dr. Donata Clay in VAD Clinic today with significant other [facility]. Reports no problems with VAD equipment or concerns with drive line. Denies any falls.   Patient reports that he has been wearing his abdominal binder while sleeping. Dr. Donata Clay discussed importance of wearing anchor closer to exit site to assist with tissue granulation. Will trial clear anchor to see if this breaks out skin. Sent home with 6 clear anchors.   [employer] rep in to assist with assessment of wound and change dressing - see below. Wound culture sent per Dr. Donata Clay.   Reports he is using Nicotine patches, but is still smoking 1 cigarette "a little bit here and there over a day."    Vital Signs:  Temp:  Doppler Pressure:  82 Automatc BP:  100/65 (83) HR: 92 SPO2: 99%  Weight: 178.9 lb w/o eqt Last weight: 178.4  lb   VAD Indication: Destination Therapy due to smoking status  VAD interrogation & Equipment Management: Speed: 5700 Flow: 4.8 Power:  4.4w    PI: 3.2 Hct: 33  Alarms: no clinical alarms Events:  2/19- 20 PI events no true suction, 2/18- 37 PI with 1 true suction, 2/17- 47 PI with 4 true suction  Fixed speed: 5700 Low speed limit: 5400  Primary Controller:  Replace back up battery in 26 months. Back up controller:   Replace back up battery in 28 months.  Annual Equipment Maintenance on UBC/PM was performed on 07/2018.   I reviewed the LVAD parameters from today and compared the results to the patient's prior recorded data. LVAD interrogation was NEGATIVE for significant power changes, NEGATIVE for clinical alarms and POSITIVE for PI events/speed drops. No programming changes were made and pump is functioning within specified parameters. Pt is performing daily controller and system monitor self tests along with completing weekly and monthly maintenance for LVAD equipment.  LVAD equipment check completed and is in good  working order. Back-up equipment present.   Exit Site Care: Drive line is being maintained every other day by [name]. Dr. Donata Clay performed dressing change today.   Existing dressing removed using sterile technique. Site cleansed with betadine swab x 2. Mepitel sheet replaced after adding 200mg  Acell powder to wound, applied sterile gel, saline saturated gauze, dry gauze and occlusive tape. Pt has adequate dressing supplies at home. Instructed to continue every other day dressing changes. Applied second achor next to dressing for increased drive line stability.        BP & Labs: Doppler 82 and is reflective of MAP  Hgb 10.4 - No S/S of bleeding. Specifically denies melena/BRBPR or nosebleeds.  LDH stable at 196 with established baseline of 175 - 255. Denies tea-colored urine. No power elevations noted on interrogation.   Patient Instructions: 1. No medication changes today.  2. Return to VAD clinic in one week for wound check with Dr. Donata Clay.   Alyce Pagan RN VAD Coordinator  Office: [phone]  24/7 Pager: [phone]

## 2019-01-08 ENCOUNTER — Ambulatory Visit (HOSPITAL_COMMUNITY): Payer: Medicare Other

## 2019-01-08 ENCOUNTER — Other Ambulatory Visit (HOSPITAL_COMMUNITY): Payer: Self-pay | Admitting: Unknown Physician Specialty

## 2019-01-08 DIAGNOSIS — Z7901 Long term (current) use of anticoagulants: Secondary | ICD-10-CM

## 2019-01-08 DIAGNOSIS — Z95811 Presence of heart assist device: Secondary | ICD-10-CM

## 2019-01-08 LAB — AEROBIC CULTURE W GRAM STAIN (SUPERFICIAL SPECIMEN): Culture: NO GROWTH

## 2019-01-11 ENCOUNTER — Ambulatory Visit (HOSPITAL_COMMUNITY): Payer: Medicare Other

## 2019-01-13 ENCOUNTER — Ambulatory Visit (HOSPITAL_COMMUNITY): Payer: Medicare Other

## 2019-01-13 ENCOUNTER — Ambulatory Visit (HOSPITAL_COMMUNITY)
Admission: RE | Admit: 2019-01-13 | Discharge: 2019-01-13 | Disposition: A | Discharge status: Home / Self Care | Payer: Medicare HMO | Source: Ambulatory Visit | Attending: Internal Medicine | Admitting: Internal Medicine

## 2019-01-13 ENCOUNTER — Ambulatory Visit (HOSPITAL_COMMUNITY): Payer: Self-pay | Admitting: Pharmacist

## 2019-01-13 DIAGNOSIS — X58XXXA Exposure to other specified factors, initial encounter: Secondary | ICD-10-CM | POA: Diagnosis not present

## 2019-01-13 DIAGNOSIS — Z95811 Presence of heart assist device: Secondary | ICD-10-CM

## 2019-01-13 DIAGNOSIS — Z7901 Long term (current) use of anticoagulants: Secondary | ICD-10-CM | POA: Diagnosis not present

## 2019-01-13 DIAGNOSIS — T827XXA Infection and inflammatory reaction due to other cardiac and vascular devices, implants and grafts, initial encounter: Secondary | ICD-10-CM | POA: Diagnosis present

## 2019-01-13 LAB — PROTIME-INR
INR: 2.5 — ABNORMAL HIGH (ref 0.8–1.2)
Prothrombin Time: 26.4 seconds — ABNORMAL HIGH (ref 11.4–15.2)

## 2019-01-13 MED ORDER — AMLODIPINE BESYLATE 5 MG PO TABS: 10.0000 mg | ORAL_TABLET | Freq: Every day | ORAL | 6 refills | Status: DC

## 2019-01-13 NOTE — Patient Instructions (Addendum)
1. Increase Norvasc (amlodopine) to 10 mg daily (2 tablets). 2. Return to clinic next week for wound check w/Dr. Donata Clay.

## 2019-01-13 NOTE — Progress Notes (Signed)
Patient presents for one week f/u with wound check per Dr. Donata Clay in VAD Clinic today with significant other [facility]. Reports no problems with VAD equipment or concerns with drive line. Denies any falls.   Patient reports that he has been wearing his abdominal binder while sleeping. Dr. Donata Clay discussed importance of wearing anchor closer to exit site to assist with tissue granulation. pt  tried the clear anchors as recommended last visit but his skin broke out.  Reports he is using Nicotine patches, but is still smoking 1 cigarette "a little bit here and there over a day."    Vital Signs:  Doppler Pressure:  98 Automatc BP:  123/77 (95) HR: 92 SPO2: 99%  Weight: 178.9 lb w/o eqt Last weight: 178.4  lb   VAD Indication: Destination Therapy due to smoking status  VAD interrogation & Equipment Management: Speed: 5700 Flow: 4.6 Power:  4.5w    PI: 3.4 Hct: 33  Alarms: no clinical alarms Events:  20-30 PI daily Fixed speed: 5700 Low speed limit: 5400  Primary Controller:  Replace back up battery in 26 months. Back up controller:   Replace back up battery in 28 months.  Annual Equipment Maintenance on UBC/PM was performed on 07/2018.   I reviewed the LVAD parameters from today and compared the results to the patient's prior recorded data. LVAD interrogation was NEGATIVE for significant power changes, NEGATIVE for clinical alarms and POSITIVE for PI events/speed drops. No programming changes were made and pump is functioning within specified parameters. Pt is performing daily controller and system monitor self tests along with completing weekly and monthly maintenance for LVAD equipment.  LVAD equipment check completed and is in good working order. Back-up equipment present.   Exit Site Care: Drive line is being maintained every other day by [name]. Dr. Donata Clay performed dressing change today.   Existing dressing removed using sterile technique. Site cleansed with  betadine swab x 2. Mepitel sheet replaced after adding 200mg  Acell powder to wound, applied sterile gel, saline saturated gauze, dry gauze and occlusive tape. Pt has adequate dressing supplies at home. Instructed to continue every other day dressing changes. Anchor replaced.       BP & Labs: Doppler 98 and is reflective of MAP  Hgb 10.4 - No S/S of bleeding. Specifically denies melena/BRBPR or nosebleeds.  LDH stable at 196 with established baseline of 175 - 255. Denies tea-colored urine. No power elevations noted on interrogation.   Patient Instructions: 1. Increase Norvasc to 10 mg daily.  2. Return to VAD clinic in one week for wound check with Dr. Donata Clay. 3. Return to VAD clinic for full visit in 1 month.    [name] RN VAD Coordinator  Office: [phone]  24/7 Pager: [phone]

## 2019-01-14 ENCOUNTER — Telehealth (HOSPITAL_COMMUNITY): Payer: Self-pay

## 2019-01-14 NOTE — Telephone Encounter (Signed)
Called [employer] to clarify Entresto dose and submit new PA for review. Clarified pt is on Entresto 49/51mg  and is not on losartan as indicated in their system.  PA submitted and will take 2-3 days for review.  [employer] [phone]. PA ID [id number]

## 2019-01-15 ENCOUNTER — Other Ambulatory Visit (HOSPITAL_COMMUNITY): Payer: Self-pay | Admitting: *Deleted

## 2019-01-15 ENCOUNTER — Ambulatory Visit (HOSPITAL_COMMUNITY): Payer: Medicare Other

## 2019-01-15 DIAGNOSIS — Z95811 Presence of heart assist device: Secondary | ICD-10-CM

## 2019-01-15 DIAGNOSIS — Z7901 Long term (current) use of anticoagulants: Secondary | ICD-10-CM

## 2019-01-15 LAB — AEROBIC CULTURE W GRAM STAIN (SUPERFICIAL SPECIMEN): Culture: NORMAL

## 2019-01-18 ENCOUNTER — Ambulatory Visit (HOSPITAL_COMMUNITY): Payer: Medicare Other

## 2019-01-20 ENCOUNTER — Ambulatory Visit (HOSPITAL_COMMUNITY): Payer: Medicare Other

## 2019-01-20 ENCOUNTER — Ambulatory Visit (HOSPITAL_COMMUNITY)
Admission: RE | Admit: 2019-01-20 | Discharge: 2019-01-20 | Disposition: A | Discharge status: Home / Self Care | Payer: Medicare HMO | Source: Ambulatory Visit | Attending: Cardiology | Admitting: Cardiology

## 2019-01-20 ENCOUNTER — Ambulatory Visit (HOSPITAL_COMMUNITY): Payer: Self-pay | Admitting: Pharmacist

## 2019-01-20 ENCOUNTER — Encounter (HOSPITAL_COMMUNITY): Payer: Self-pay

## 2019-01-20 DIAGNOSIS — Z95811 Presence of heart assist device: Secondary | ICD-10-CM | POA: Insufficient documentation

## 2019-01-20 DIAGNOSIS — Z7901 Long term (current) use of anticoagulants: Secondary | ICD-10-CM | POA: Diagnosis not present

## 2019-01-20 LAB — PROTIME-INR
INR: 4 — ABNORMAL HIGH (ref 0.8–1.2)
PROTHROMBIN TIME: 38.7 s — AB (ref 11.4–15.2)

## 2019-01-20 NOTE — Progress Notes (Addendum)
Patient presents for one week f/u with wound check per Dr. Donata Clay in VAD Clinic today with significant other [facility]. Reports no problems with VAD equipment or concerns with drive line. Denies any falls.   Patient reports that he has been wearing his abdominal binder while sleeping. Dr. Donata Clay discussed importance of wearing anchor closer to exit site to assist with tissue granulation. pt tried the white and clear anchors as recommended but his skin broke out. He is wearing one anchor on his hip.   Reports he is using Nicotine patches, but is still smoking 1 cigarette "a little bit here and there over a day."    Vital Signs:  Doppler Pressure:  100 Automatc BP:  99/75 (89) HR: 77 SPO2: 99%  Weight: 178.9 lb w/o eqt Last weight: 178.4  lb   VAD Indication: Destination Therapy due to smoking status  VAD interrogation & Equipment Management: Speed: 5700 Flow: 4.8 Power:  4.5w    PI: 3.1 Hct: 33  Alarms: no clinical alarms Events:  20-60 PI daily Fixed speed: 5700 Low speed limit: 5400  Primary Controller:  Replace back up battery in 26 months. Back up controller:   Replace back up battery in 28 months.  Annual Equipment Maintenance on UBC/PM was performed on 07/2018.   I reviewed the LVAD parameters from today and compared the results to the patient's prior recorded data. LVAD interrogation was NEGATIVE for significant power changes, NEGATIVE for clinical alarms and POSITIVE for PI events/speed drops. No programming changes were made and pump is functioning within specified parameters. Pt is performing daily controller and system monitor self tests along with completing weekly and monthly maintenance for LVAD equipment.  LVAD equipment check completed and is in good working order. Back-up equipment present.   Exit Site Care: Drive line is being maintained every other day by [name]. Dr. Donata Clay performed dressing change today.   Existing dressing removed using  sterile technique. Site cleansed with betadine swab x 2. Mepitel sheet replaced after adding 300mg  Acell powder to wound, applied sterile gel, saline saturated gauze, dry gauze and occlusive tape. Pt has adequate dressing supplies at home. Instructed to continue every other day dressing changes. Anchor replaced.          BP & Labs: Doppler 100 and is reflective of modified systolic.   Hgb not drawn today - No S/S of bleeding. Specifically denies melena/BRBPR or nosebleeds.  LDH stable at not drawn today with established baseline of 175 - 255. Denies tea-colored urine. No power elevations noted on interrogation.   Patient Instructions: 1. Return to VAD clinic in one week for wound check with Dr. Donata Clay.    Alyce Pagan RN VAD Coordinator  Office: [phone]  24/7 Pager: [phone]

## 2019-01-22 ENCOUNTER — Ambulatory Visit (HOSPITAL_COMMUNITY): Payer: Medicare Other

## 2019-01-25 ENCOUNTER — Ambulatory Visit (HOSPITAL_COMMUNITY): Payer: Medicare Other

## 2019-01-26 ENCOUNTER — Other Ambulatory Visit (HOSPITAL_COMMUNITY): Payer: Self-pay | Admitting: Unknown Physician Specialty

## 2019-01-26 DIAGNOSIS — Z7901 Long term (current) use of anticoagulants: Secondary | ICD-10-CM

## 2019-01-26 DIAGNOSIS — Z95811 Presence of heart assist device: Secondary | ICD-10-CM

## 2019-01-27 ENCOUNTER — Ambulatory Visit (HOSPITAL_COMMUNITY): Payer: Self-pay | Admitting: Pharmacist

## 2019-01-27 ENCOUNTER — Other Ambulatory Visit: Payer: Self-pay

## 2019-01-27 ENCOUNTER — Ambulatory Visit (HOSPITAL_COMMUNITY)
Admission: RE | Admit: 2019-01-27 | Discharge: 2019-01-27 | Disposition: A | Discharge status: Home / Self Care | Payer: Medicare HMO | Source: Ambulatory Visit | Attending: Cardiology | Admitting: Cardiology

## 2019-01-27 ENCOUNTER — Ambulatory Visit (HOSPITAL_COMMUNITY): Payer: Medicare Other

## 2019-01-27 DIAGNOSIS — Z95811 Presence of heart assist device: Secondary | ICD-10-CM

## 2019-01-27 DIAGNOSIS — Z7901 Long term (current) use of anticoagulants: Secondary | ICD-10-CM | POA: Diagnosis not present

## 2019-01-27 LAB — PROTIME-INR
INR: 3.4 — ABNORMAL HIGH (ref 0.8–1.2)
Prothrombin Time: 33.4 seconds — ABNORMAL HIGH (ref 11.4–15.2)

## 2019-01-27 NOTE — Progress Notes (Signed)
Patient presents for one week f/u with wound check in VAD Clinic today with significant other [name]. Reports no problems with VAD equipment or concerns with drive line. Denies any falls. Patient reports that he has been wearing his abdominal binder while sleeping.    Exit Site Care: Drive line is being maintained every other day by [name]. Existing dressing removed using sterile technique. Site cleansed with betadine swab x 2. Mepitel sheet replaced after adding Acell powder to wound, applied sterile gel, and sterile dry gauze and occlusive tape. Pt supplied with box of cotton tip applicators, box of 4x4 gauze, and a roll of medipore tape. Instructed to continue every other day dressing changes. Anchor replaced.         Will see back in clinic in one week for wound check with Dr. Donata Clay.     Alyce Pagan RN VAD Coordinator  Office: [phone]  24/7 Pager: [phone]

## 2019-01-28 ENCOUNTER — Encounter: Payer: Self-pay | Admitting: Internal Medicine

## 2019-01-28 ENCOUNTER — Ambulatory Visit (INDEPENDENT_AMBULATORY_CARE_PROVIDER_SITE_OTHER): Payer: Medicare HMO | Admitting: Internal Medicine

## 2019-01-28 DIAGNOSIS — T827XXA Infection and inflammatory reaction due to other cardiac and vascular devices, implants and grafts, initial encounter: Secondary | ICD-10-CM | POA: Diagnosis not present

## 2019-01-28 NOTE — Assessment & Plan Note (Signed)
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  His last two exit site cultures have been negative.  I agree with observation off of antibiotics.  He will be followed in the LVAD clinic weekly so I will see him back here as needed.

## 2019-01-28 NOTE — Progress Notes (Signed)
[place] for Infectious Disease  Patient Active Problem List   Diagnosis Date Noted  . Infection associated with driveline of left ventricular assist device (LVAD) ([place]) 11/28/2018    Priority: High  . Chronic systolic heart failure ([place]) 11/28/2018  . Complication involving left ventricular assist device (LVAD) 11/23/2018  . OSA (obstructive sleep apnea) 09/02/2018  . LVAD (left ventricular assist device) present ([place]) 08/03/2018  . DVT (deep venous thrombosis) ([place]) 08/02/2018  . Goals of care, counseling/discussion   . Advance care planning   . Palliative care encounter   . Malnutrition of moderate degree 07/25/2018  . Ascites   . COPD (chronic obstructive pulmonary disease) ([place]) 07/21/2018  . HTN (hypertension) 07/21/2018  . Tobacco use 07/21/2018  . Congestive heart failure ([place]) 07/21/2018  . CHF (congestive heart failure) ([place]) 07/21/2018  . Type 2 diabetes mellitus ([place]) 07/21/2018  . Hyperlipidemia 07/21/2018  . Fibromyalgia syndrome 01/27/2017  . Myofascial pain syndrome, cervical 01/27/2017  . Lumbar radiculopathy 01/24/2014  . History of lumbar laminectomy for spinal cord decompression 12/07/2013  . 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    Patient's Medications  New Prescriptions   No medications on file  Previous Medications   AMLODIPINE (NORVASC) 5 MG TABLET    Take 2 tablets (10 mg total) by mouth daily.   ASPIRIN EC 81 MG TABLET    Take 81 mg by mouth daily.   ATORVASTATIN (LIPITOR) 20 MG TABLET    Take 1 tablet (20 mg total) by mouth daily.   BLOOD GLUCOSE METER KIT AND SUPPLIES    Dispense based on patient and insurance preference. Use up to four times daily as directed. (FOR ICD-10 E10.9, E11.9).   CARBAMIDE PEROXIDE (DEBROX) 6.5 % OTIC SOLUTION    Place 5 drops into both ears 3 (three) times daily.   CYANOCOBALAMIN 500 MCG TABLET    Take 1,000 mcg by mouth daily.    DOCUSATE SODIUM (COLACE) 100 MG  CAPSULE    Take 2 capsules (200 mg total) by mouth daily as needed for mild constipation.   DULOXETINE (CYMBALTA) 60 MG CAPSULE    Take 60 mg by mouth daily.   ENOXAPARIN (LOVENOX) 40 MG/0.4ML INJECTION    Inject 0.4 mLs (40 mg total) into the skin every 12 (twelve) hours for 10 doses.   FLUTICASONE (FLONASE) 50 MCG/ACT NASAL SPRAY    Place 1 spray into both nostrils 2 (two) times daily as needed for allergies.    FUROSEMIDE (LASIX) 40 MG TABLET    Take 0.5 tablets (20 mg total) by mouth as needed.   GABAPENTIN (NEURONTIN) 300 MG CAPSULE    Take 600 mg by mouth 3 (three) times daily.   GLUCOSE BLOOD (CONTOUR NEXT TEST) TEST STRIP    Use as instructed   HYDROXYZINE (VISTARIL) 25 MG CAPSULE    Take 25 mg by mouth 3 (three) times daily as needed for anxiety.   IPRATROPIUM-ALBUTEROL (COMBIVENT) 20-100 MCG/ACT AERS RESPIMAT    Inhale 1 puff into the lungs every 6 (six) hours.   LANCETS (FREESTYLE) LANCETS    Use as instructed   METFORMIN (GLUCOPHAGE-XR) 500 MG 24 HR TABLET    Take 500 mg by mouth 2 (two) times daily.   METHOCARBAMOL (ROBAXIN) 750 MG TABLET    Take 750 mg by mouth 3 (three) times daily as needed for muscle spasms.    NICOTINE (NICODERM CQ - DOSED IN MG/24 HR) 7 MG/24HR PATCH  Place 1 patch (7 mg total) onto the skin daily as needed (tobacco craving).   NYSTATIN (MYCOSTATIN/NYSTOP) POWDER    Use with VAD dressing changes daily, advance as directed.   OMEPRAZOLE (PRILOSEC) 20 MG CAPSULE    Take 20 mg by mouth daily.   OXYCODONE (OXY IR/ROXICODONE) 5 MG IMMEDIATE RELEASE TABLET    Take 1 - 2 tabs q 4 - 6 hrs prn pain   POLYETHYLENE GLYCOL (MIRALAX / GLYCOLAX) PACKET    Take 17 g by mouth daily as needed for mild constipation.   SACUBITRIL-VALSARTAN (ENTRESTO) 49-51 MG    Take 1 tablet by mouth 2 (two) times daily.   SILDENAFIL (VIAGRA) 100 MG TABLET    Take 50 mg by mouth daily as needed for erectile dysfunction.   SPIRONOLACTONE (ALDACTONE) 25 MG TABLET    Take 1 tablet (25 mg total) by  mouth daily.   THIAMINE 100 MG TABLET    Take 100 mg by mouth daily.   TRAMADOL (ULTRAM) 50 MG TABLET    Take 1 tablet (50 mg total) by mouth every 4 (four) hours as needed for moderate pain.   WARFARIN (COUMADIN) 5 MG TABLET    Take  1 and 1/2 tablets (7.5 mg) daily   ZINC GLUCONATE 100 MG TABS    Take 1 tablet (100 mg total) by mouth daily.  Modified Medications   No medications on file  Discontinued Medications   No medications on file    Subjective: Theodore Welch is in for his routine follow-up visit.  He had an LVAD placed last year and recently developed some spontaneous drainage from the previous site of his mid abdomen counterincision.  It was opened and noted to travel down to the rectus sheath but initially did not appear to involve the driveline or device.  Cultures grew MSSA.  CT scan showed a small amount of inflammation and ill-defined fluid along the driveline in the anterior abdominal wall.  He was treated with IV cefazolin.  Dr. Lucianne Lei Trigt debrided the area on 12/03/2018.  He found that there was a space from the exit site of the power cord up to the mid abdominal incision.  Cultures again grew MSSA.  He completed 4 weeks of IV cefazolin and then converted to oral cephalexin little over 1 month ago.  He saw Dr. Prescott Gum last week who recommended that he stop cephalexin because the wound was looking better.  Review of Systems: Review of Systems  Constitutional: Negative for chills, diaphoresis and fever.  Gastrointestinal: Negative for abdominal pain, diarrhea, nausea and vomiting.  Skin: Negative for rash.    Past Medical History:  Diagnosis Date  . Cardiomyopathy, unspecified ([place])   . CHF (congestive heart failure) ([place])   . Chronic back pain   . Enlarged heart   . Gastric ulcer   . Gastroenteritis   . H/O degenerative disc disease     Social History   Tobacco Use  . Smoking status: Former Smoker    Types: Cigarettes    Start date: 2003  . Smokeless tobacco: Never Used   . Tobacco comment: Nicoderm patch   Substance Use Topics  . Alcohol use: Not Currently  . Drug use: Not Currently    No family history on file.  No Known Allergies  Objective: Vitals:   01/28/19 1341  Temp: 98.3 F (36.8 C)  Weight: 182 lb (82.6 kg)   Body mass index is 26.11 kg/m.  Physical Exam Constitutional:  Comments: He is in good spirits.  Cardiovascular:     Comments: LVAD hum present. Pulmonary:     Effort: Pulmonary effort is normal.     Breath sounds: Normal breath sounds.  Abdominal:     Comments: He has a clean dry gauze dressing over his abdominal wound and exit site.     Lab Results    Problem List Items Addressed This Visit      High   Infection associated with driveline of left ventricular assist device (LVAD) ([place])    I am hopeful that his MSSA LVAD driveline site infection has been cured.  His last two exit site cultures have been negative.  I agree with observation off of antibiotics.  He will be followed in the LVAD clinic weekly so I will see him back here as needed.          Michel Bickers, MD [facility] [phone] pager   [phone] cell 01/28/2019, 2:04 PM

## 2019-01-29 ENCOUNTER — Other Ambulatory Visit (HOSPITAL_COMMUNITY): Payer: Self-pay | Admitting: Unknown Physician Specialty

## 2019-01-29 ENCOUNTER — Ambulatory Visit (HOSPITAL_COMMUNITY): Payer: Medicare Other

## 2019-01-29 DIAGNOSIS — Z95811 Presence of heart assist device: Secondary | ICD-10-CM

## 2019-01-29 DIAGNOSIS — Z7901 Long term (current) use of anticoagulants: Secondary | ICD-10-CM

## 2019-02-01 ENCOUNTER — Ambulatory Visit (HOSPITAL_COMMUNITY): Payer: Medicare Other

## 2019-02-02 ENCOUNTER — Telehealth (HOSPITAL_COMMUNITY): Payer: Self-pay | Admitting: Licensed Clinical Social Worker

## 2019-02-02 NOTE — Telephone Encounter (Signed)
CSW contacted patient to assure food, medications and confirmation of VAD pager number if concerns or emergency arise. Patient instructed to stay home and use of proper hygiene and call if needed.  Patient informed that VAD team will call the day before any upcoming appointments with instructions due to current CoVid 19 outbreak. Patient verbalizes understanding and denies any current concerns. Jackie Stanlee Roehrig, LCSW, CCSW-MCS [phone]  

## 2019-02-03 ENCOUNTER — Ambulatory Visit (HOSPITAL_COMMUNITY)
Admission: RE | Admit: 2019-02-03 | Discharge: 2019-02-03 | Disposition: A | Discharge status: Home / Self Care | Payer: Medicare HMO | Source: Ambulatory Visit | Attending: Internal Medicine | Admitting: Internal Medicine

## 2019-02-03 ENCOUNTER — Ambulatory Visit (HOSPITAL_COMMUNITY): Payer: Medicare Other

## 2019-02-03 ENCOUNTER — Ambulatory Visit (HOSPITAL_COMMUNITY): Payer: Self-pay | Admitting: Pharmacist

## 2019-02-03 ENCOUNTER — Telehealth (HOSPITAL_COMMUNITY): Payer: Self-pay | Admitting: *Deleted

## 2019-02-03 ENCOUNTER — Encounter (HOSPITAL_COMMUNITY): Payer: Self-pay

## 2019-02-03 ENCOUNTER — Other Ambulatory Visit: Payer: Self-pay

## 2019-02-03 VITALS — BP 100/0 | HR 72

## 2019-02-03 DIAGNOSIS — Z95811 Presence of heart assist device: Secondary | ICD-10-CM

## 2019-02-03 DIAGNOSIS — T827XXD Infection and inflammatory reaction due to other cardiac and vascular devices, implants and grafts, subsequent encounter: Secondary | ICD-10-CM

## 2019-02-03 DIAGNOSIS — T829XXD Unspecified complication of cardiac and vascular prosthetic device, implant and graft, subsequent encounter: Secondary | ICD-10-CM | POA: Diagnosis not present

## 2019-02-03 DIAGNOSIS — F17210 Nicotine dependence, cigarettes, uncomplicated: Secondary | ICD-10-CM | POA: Insufficient documentation

## 2019-02-03 DIAGNOSIS — Z7901 Long term (current) use of anticoagulants: Secondary | ICD-10-CM

## 2019-02-03 DIAGNOSIS — Z4800 Encounter for change or removal of nonsurgical wound dressing: Secondary | ICD-10-CM | POA: Insufficient documentation

## 2019-02-03 DIAGNOSIS — B999 Unspecified infectious disease: Secondary | ICD-10-CM | POA: Diagnosis not present

## 2019-02-03 LAB — PROTIME-INR
INR: 1.7 — AB (ref 0.8–1.2)
Prothrombin Time: 20.1 seconds — ABNORMAL HIGH (ref 11.4–15.2)

## 2019-02-03 MED ORDER — DOXYCYCLINE HYCLATE 50 MG PO CAPS: 100.0000 mg | ORAL_CAPSULE | Freq: Two times a day (BID) | ORAL | 0 refills | Status: DC

## 2019-02-03 NOTE — Telephone Encounter (Signed)
Called patient per Dr. Maren Beach - will need to start Doxy 100 mg twice daily for 14 days based on initial results of LVAD drive line culture obtained today. Rx sent to local pharmacy. Updated [name], PharmD (who manages his INR).  Pt verbalized understanding of above. Will see him back in VAD clinic next Wednesday.  Hessie Diener RN, VAD Coordinator [phone]

## 2019-02-03 NOTE — Progress Notes (Signed)
Patient presents for one week f/u with wound check per Dr. Donata Clay in VAD Clinic today.  Reports no problems with VAD equipment or concerns with drive line.    Drive line site care today per Dr. Donata Clay. Increased amount of drainage; he obtained wound culture today. Will continue every other day dressing changes.   Vital Signs:  Doppler Pressure:  100 Automatc BP:  102/76 (91) HR: 72 SPO2: 100%  VAD Indication: Destination Therapy due to smoking status  VAD interrogation & Equipment Management: Speed: 5700 Flow: 4.5 Power:  4.4w    PI: 3.9 Hct: 33  Alarms: no clinical alarms Events:  20-60 PI daily Fixed speed: 5700 Low speed limit: 5400  I reviewed the LVAD parameters from today and compared the results to the patient's prior recorded data. LVAD interrogation was NEGATIVE for significant power changes, NEGATIVE for clinical alarms and POSITIVE for PI events/speed drops. No programming changes were made and pump is functioning within specified parameters. Pt is performing daily controller and system monitor self tests along with completing weekly and monthly maintenance for LVAD equipment.  LVAD equipment check completed and is in good working order. Back-up equipment present.   Exit Site Care: Drive line is being maintained every other day by [name]. Dr. Donata Clay performed dressing change today.   Existing dressing removed using sterile technique. Site cleansed with betadine swab x 2. Mepitel sheet replaced after adding 300mg  Acell powder to wound, applied sterile gel, saline saturated gauze, dry gauze and occlusive tape. Pt provided with 6 dressing kits for home use. Instructed to continue every other day dressing changes. Anchor replaced. Wound culture sent to lab.        Patient Instructions: 1. Return to VAD clinic in one week for wound check with Dr. Donata Clay.    Hessie Diener RN VAD Coordinator  Office: [phone]  24/7 Pager: [phone]

## 2019-02-05 ENCOUNTER — Ambulatory Visit (HOSPITAL_COMMUNITY): Payer: Medicare Other

## 2019-02-06 LAB — AEROBIC CULTURE W GRAM STAIN (SUPERFICIAL SPECIMEN): Gram Stain: NONE SEEN

## 2019-02-08 ENCOUNTER — Ambulatory Visit (HOSPITAL_COMMUNITY): Payer: Medicare Other

## 2019-02-09 ENCOUNTER — Other Ambulatory Visit (HOSPITAL_COMMUNITY): Payer: Self-pay | Admitting: Unknown Physician Specialty

## 2019-02-09 DIAGNOSIS — Z95811 Presence of heart assist device: Secondary | ICD-10-CM

## 2019-02-09 DIAGNOSIS — Z7901 Long term (current) use of anticoagulants: Secondary | ICD-10-CM

## 2019-02-10 ENCOUNTER — Ambulatory Visit (HOSPITAL_COMMUNITY): Payer: Medicare Other

## 2019-02-10 ENCOUNTER — Other Ambulatory Visit: Payer: Self-pay

## 2019-02-10 ENCOUNTER — Ambulatory Visit (HOSPITAL_COMMUNITY)
Admission: RE | Admit: 2019-02-10 | Discharge: 2019-02-10 | Disposition: A | Discharge status: Home / Self Care | Payer: Medicare HMO | Source: Ambulatory Visit | Attending: Cardiology | Admitting: Cardiology

## 2019-02-10 ENCOUNTER — Ambulatory Visit (HOSPITAL_COMMUNITY): Payer: Self-pay | Admitting: Pharmacist

## 2019-02-10 DIAGNOSIS — K219 Gastro-esophageal reflux disease without esophagitis: Secondary | ICD-10-CM | POA: Diagnosis not present

## 2019-02-10 DIAGNOSIS — I428 Other cardiomyopathies: Secondary | ICD-10-CM | POA: Diagnosis not present

## 2019-02-10 DIAGNOSIS — F17210 Nicotine dependence, cigarettes, uncomplicated: Secondary | ICD-10-CM | POA: Insufficient documentation

## 2019-02-10 DIAGNOSIS — I82409 Acute embolism and thrombosis of unspecified deep veins of unspecified lower extremity: Secondary | ICD-10-CM | POA: Insufficient documentation

## 2019-02-10 DIAGNOSIS — K7460 Unspecified cirrhosis of liver: Secondary | ICD-10-CM | POA: Insufficient documentation

## 2019-02-10 DIAGNOSIS — I420 Dilated cardiomyopathy: Secondary | ICD-10-CM | POA: Diagnosis not present

## 2019-02-10 DIAGNOSIS — E785 Hyperlipidemia, unspecified: Secondary | ICD-10-CM | POA: Insufficient documentation

## 2019-02-10 DIAGNOSIS — Z4800 Encounter for change or removal of nonsurgical wound dressing: Secondary | ICD-10-CM | POA: Insufficient documentation

## 2019-02-10 DIAGNOSIS — E118 Type 2 diabetes mellitus with unspecified complications: Secondary | ICD-10-CM | POA: Insufficient documentation

## 2019-02-10 DIAGNOSIS — I5022 Chronic systolic (congestive) heart failure: Secondary | ICD-10-CM | POA: Insufficient documentation

## 2019-02-10 DIAGNOSIS — G4733 Obstructive sleep apnea (adult) (pediatric): Secondary | ICD-10-CM | POA: Diagnosis not present

## 2019-02-10 DIAGNOSIS — Z79899 Other long term (current) drug therapy: Secondary | ICD-10-CM | POA: Insufficient documentation

## 2019-02-10 DIAGNOSIS — Z7901 Long term (current) use of anticoagulants: Secondary | ICD-10-CM | POA: Insufficient documentation

## 2019-02-10 DIAGNOSIS — Z7982 Long term (current) use of aspirin: Secondary | ICD-10-CM | POA: Diagnosis not present

## 2019-02-10 DIAGNOSIS — Z7984 Long term (current) use of oral hypoglycemic drugs: Secondary | ICD-10-CM | POA: Diagnosis not present

## 2019-02-10 DIAGNOSIS — Z95811 Presence of heart assist device: Secondary | ICD-10-CM

## 2019-02-10 LAB — COMPREHENSIVE METABOLIC PANEL
ALT: 14 U/L (ref 0–44)
ANION GAP: 11 (ref 5–15)
AST: 21 U/L (ref 15–41)
Albumin: 3.8 g/dL (ref 3.5–5.0)
Alkaline Phosphatase: 139 U/L — ABNORMAL HIGH (ref 38–126)
BUN: 8 mg/dL (ref 6–20)
CO2: 22 mmol/L (ref 22–32)
Calcium: 9.2 mg/dL (ref 8.9–10.3)
Chloride: 108 mmol/L (ref 98–111)
Creatinine, Ser: 0.91 mg/dL (ref 0.61–1.24)
GFR calc Af Amer: >60 mL/min (ref >60)
GFR calc non Af Amer: >60 mL/min (ref >60)
Glucose, Bld: 129 mg/dL — ABNORMAL HIGH (ref 70–99)
POTASSIUM: 3.5 mmol/L (ref 3.5–5.1)
Sodium: 141 mmol/L (ref 135–145)
Total Bilirubin: 1 mg/dL (ref 0.3–1.2)
Total Protein: 6.8 g/dL (ref 6.5–8.1)

## 2019-02-10 LAB — CBC
HCT: 37.2 % — ABNORMAL LOW (ref 39.0–52.0)
HEMOGLOBIN: 11.2 g/dL — AB (ref 13.0–17.0)
MCH: 24 pg — ABNORMAL LOW (ref 26.0–34.0)
MCHC: 30.1 g/dL (ref 30.0–36.0)
MCV: 79.7 fL — ABNORMAL LOW (ref 80.0–100.0)
NRBC: 0 % (ref 0.0–0.2)
Platelets: 300 10*3/uL (ref 150–400)
RBC: 4.67 MIL/uL (ref 4.22–5.81)
RDW: 16.5 % — ABNORMAL HIGH (ref 11.5–15.5)
WBC: 8.6 10*3/uL (ref 4.0–10.5)

## 2019-02-10 LAB — PREALBUMIN: Prealbumin: 23.8 mg/dL (ref 18–38)

## 2019-02-10 LAB — PROTIME-INR
INR: 1.9 — ABNORMAL HIGH (ref 0.8–1.2)
Prothrombin Time: 21.4 seconds — ABNORMAL HIGH (ref 11.4–15.2)

## 2019-02-10 LAB — LACTATE DEHYDROGENASE: LDH: 174 U/L (ref 98–192)

## 2019-02-10 NOTE — Patient Instructions (Addendum)
Daily dressing change instructions:  1. Pack silver strip under driveline.  2. Saline moistened 4x4 on top of silver strip under drive line 3. Top with daily dressing.   1. Return to VAD clinic in 1 month.  2. Continue current Coumadin regimen

## 2019-02-10 NOTE — Progress Notes (Addendum)
Patient presents for one week f/u with wound check per Dr. Prescott Gum in [facility] today.  Reports no problems with VAD equipment or concerns with drive line. Denies dizziness, falls, shortness of breath.   Drive line site care today per Dr. Prescott Gum. Decreased drainage noted. Patient on Doxycycline '100mg'$  BID day 7 of 14. Will continue until next week.  Reports he is still smoking "a pull every now and then" of a cigarette daily. Using Nicotine gum and patches. Discussed importance of complete cessation for 6 months before we can discuss transplant. Patient verbalized understanding and he said he would "be done" and "show [name] he is serious about transplant."   Vital Signs:  Doppler Pressure:  96 Automatc BP:  103/81 (88) HR: 82 SPO2: 100%  Weight: 184.6 lb w/o eqt Last weight: 178.9 lb Home weights:  lbs  VAD Indication: Destination Therapy due to smoking status  VAD interrogation & Equipment Management: Speed: 5700 Flow: 4.5 Power:  4.4w    PI: 4.4 Hct: 33  Alarms: few low voltage Events:  10-50 PI daily Fixed speed: 5700 Low speed limit: 5400  I reviewed the LVAD parameters from today and compared the results to the patient's prior recorded data. LVAD interrogation was NEGATIVE for significant power changes, NEGATIVE for clinical alarms and POSITIVE for PI events/speed drops. No programming changes were made and pump is functioning within specified parameters. Pt is performing daily controller and system monitor self tests along with completing weekly and monthly maintenance for LVAD equipment.  LVAD equipment check completed and is in good working order. Back-up equipment present.   Exit Site Care: Drive line is being maintained every other day by [employer]. Dr. Prescott Gum performed dressing change today.   Existing dressing removed using sterile technique. Site cleansed with betadine swab x 2. Aquacel silver strip packed in wound bed. Saline moistened 4x4 placed on top of  silver strip. Topped with dressing from daily kit. Will increase dressing changes to daily.  Pt provided with 8 dressing kits, silver strips, and sterile 4x4s. He states that he has sterile saline flushes at home. Drive line anchor intact.      BP & Labs: Doppler 96 and is reflective of Modified systolic.   Hgb 11.2 - No S/S of bleeding. Specifically denies melena/BRBPR or nosebleeds.  LDH stable at 174 with established baseline of 175 - 255. Denies tea-colored urine. No power elevations noted on interrogation.  6 month Intermacs follow up completed including:  Quality of Life, KCCQ-12, and Neurocognitive trail making. Neurocognitive trail making explained thoroughly prior to start. Patient states "my head is just not right today" and continued to incorrectly fill out test despite VAD coordinator stopping, reeducation, and having him start again.   6 minute walk not completed. Patient needed to go home.    Back up controller: Equipment not present during visit.    Patient Instructions: 1. No medication changes. 2. Daily dressings changes with silver strip and wet to dry.  3. Continue current Coumadin dosing.  4. Return to [place] [facility] in one week for wound check with Dr. Prescott Gum.   Emerson Monte RN VAD Coordinator  Office: [phone]  24/7 Pager: [phone]   58 yo with history of nonischemic cardiomyopathy, RLE DVT, cirrhosis, smoking, and OSA returns for followup of CHF/LVAD placement.  Cardiomyopathy was diagnosed in 6/19 in [place], [place] at that time showed low output.  He was admitted to [facility] in 9/19 with low output HF and was started on  milrinone and diuresed.  Unable to wean off milrinone.  He had a degree of RV failure, but this improved on milrinone.  Valvular heart disease also looked better with milrinone and diuresis.  On 08/03/18, he had Heartmate 3 LVAD placed.  Speed was optimized by ramp echo post-op.  Post-op course was relatively unremarkable.  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     He feels good.  Has been walking for exercise, no significant exertional dyspnea.  Still smokes a cigarette or 2 a day but has cut back a lot.  No lightheadedness.  Currently on doxycycline for 1 more week with driveline drainage, this is improving.  Still with frequent PI events (10-50) but improved.   Labs (9/19): LDH 210, INR 2.17, WBCs 17.1 => 15, hgb 9.7, creatinine 0.72 Labs (10/19): K 4.3, creatinine 0.78 => 0.83, hgb 10.2 Labs (11/19): creatinine 0.79, hgb 10 Labs (2/20): K 3.9, creatinine 0.79  Denies LVAD alarms.  Denies driveline trauma, erythema or drainage.  Denies ICD shocks.   Reports taking Coumadin as prescribed and adherence to anticoagulation based dietary restrictions.  Denies bright red blood per rectum or melena, no dark urine or hematuria.    PMH: 1. Degenerative disc disease.  2. GERD 3. Chronic systolic CHF:  Nonischemic cardiomyopathy.  Dilated cardiomyopathy diagnosed 6/19 in [place].  LHC/RHC in 7/19 showed elevated filling pressures, low cardiac output, and no significant CAD.   - Echo (9/19): Severe LV dilation with EF 10-20%, moderate-severe MR, severely dilated RV with mildly decreased systolic function, severe TR.  - Cardiac MRI (9/19): EF 14%, moderate dilated LV, severely dilated RV with mod-severe systolic function and EF 65%, nonspecific RV insertion site LGE.  - RHC (5/19) on milrinone 0.375: mean RA 8, PA 40/19, mean PCWP 12, PAPi 2.65, CI 2.71.  - Echo (9/19, on milrinone and diuresed): EF 20-25% with moderate LV dilation, moderately dilated RV with mildly decreased systolic function, mild-moderate MR, moderate TR, PASP 51 mmHg.  Cannot rule out noncompaction.  - Heartmate 3 LVAD placement in 9/19.  4. OSA: CPAP use.  5. Prior smoker 6. Type 2 diabetes 7. Hyperlipidemia.  8. H/o NSVT 9. 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  10. RLE DVT: found in 9/19.  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   Current Outpatient Medications  Medication Sig Dispense Refill   amLODipine (NORVASC) 5 MG tablet Take 2 tablets (10 mg total) by mouth daily. 60 tablet 6   aspirin EC 81 MG tablet Take 81 mg by mouth daily.     atorvastatin (LIPITOR) 20 MG tablet Take 1 tablet (20 mg total) by mouth daily. 30 tablet 6   carbamide peroxide (DEBROX) 6.5 % OTIC solution Place 5 drops into both ears 3 (three) times daily. 15 mL 0   cyanocobalamin 500 MCG tablet Take 1,000 mcg by mouth daily.      docusate sodium (COLACE) 100 MG capsule Take 2 capsules (200 mg total) by mouth daily as needed for mild constipation. 10 capsule 0   doxycycline (VIBRAMYCIN) 50 MG capsule Take 2 capsules (100 mg total) by mouth 2 (two) times daily. 56 capsule 0   DULoxetine (CYMBALTA) 60 MG capsule Take 60 mg by mouth daily.     fluticasone (FLONASE) 50 MCG/ACT nasal spray Place 1 spray into both nostrils 2 (two) times daily as needed for allergies.      gabapentin (NEURONTIN) 300 MG capsule Take 600 mg by mouth 3 (three) times  daily.     glucose blood (CONTOUR NEXT TEST) test strip Use as instructed 100 each 12   hydrOXYzine (VISTARIL) 25 MG capsule Take 25 mg by mouth 3 (three) times daily as needed for anxiety.     Ipratropium-Albuterol (COMBIVENT) 20-100 MCG/ACT AERS respimat Inhale 1 puff into the lungs every 6 (six) hours.     Lancets (FREESTYLE) lancets Use as instructed 100 each 12   metFORMIN (GLUCOPHAGE-XR) 500 MG 24 hr tablet Take 500 mg by mouth 2 (two) times daily.     methocarbamol (ROBAXIN) 750 MG tablet Take 750 mg by mouth 3 (three) times daily as needed for muscle spasms.      nicotine (NICODERM CQ - DOSED IN MG/24 HR) 7 mg/24hr patch Place 1 patch (7 mg total) onto the skin daily as needed (tobacco craving). 28 patch 0   nystatin (MYCOSTATIN/NYSTOP) powder Use with VAD dressing changes daily, advance as directed. 15 g 3   omeprazole (PRILOSEC) 20 MG capsule Take 20 mg by  mouth daily.     oxyCODONE (OXY IR/ROXICODONE) 5 MG immediate release tablet Take 1 - 2 tabs q 4 - 6 hrs prn pain 30 tablet 0   polyethylene glycol (MIRALAX / GLYCOLAX) packet Take 17 g by mouth daily as needed for mild constipation.     sacubitril-valsartan (ENTRESTO) 49-51 MG Take 1 tablet by mouth 2 (two) times daily. 60 tablet 3   sildenafil (VIAGRA) 100 MG tablet Take 50 mg by mouth daily as needed for erectile dysfunction.     spironolactone (ALDACTONE) 25 MG tablet Take 1 tablet (25 mg total) by mouth daily. 30 tablet 6   thiamine 100 MG tablet Take 100 mg by mouth daily.     traMADol (ULTRAM) 50 MG tablet Take 1 tablet (50 mg total) by mouth every 4 (four) hours as needed for moderate pain. 30 tablet 0   warfarin (COUMADIN) 5 MG tablet Take  1 and 1/2 tablets (7.5 mg) TTSa and 1 tab = '5mg'$  MWFSu     Zinc Gluconate 100 MG TABS Take 1 tablet (100 mg total) by mouth daily. 90 tablet 3   enoxaparin (LOVENOX) 40 MG/0.4ML injection Inject 0.4 mLs (40 mg total) into the skin every 12 (twelve) hours for 10 doses. 15 Syringe 1   No current facility-administered medications for this encounter.     Patient has no known allergies.  REVIEW OF SYSTEMS: All systems negative except as listed in HPI, PMH and Problem list.   LVAD INTERROGATION:  Please see LVAD nurse's note above for details.    I reviewed the LVAD parameters from today, and compared the results to the patient's prior recorded data.  No programming changes were made.  The LVAD is functioning within specified parameters.  The patient performs LVAD self-test daily.  LVAD interrogation was negative for any significant power changes, alarms or PI events/speed drops.  LVAD equipment check completed and is in good working order.  Back-up equipment present.   LVAD education done on emergency procedures and precautions and reviewed exit site care.    MAP 88 HR 70  Physical Exam: General: Well appearing this am. NAD.  HEENT:  Normal. Neck: Supple, JVP 7-8 cm. Carotids OK.  Cardiac:  Mechanical heart sounds with LVAD hum present.  Lungs:  CTAB, normal effort.  Abdomen:  NT, ND, no HSM. No bruits or masses. +BS  LVAD exit site: Well-healed and incorporated. Dressing dry and intact. No erythema or drainage. Stabilization device present and accurately applied.  Driveline dressing changed daily per sterile technique. Extremities:  Warm and dry. No cyanosis, clubbing, rash, or edema.  Neuro:  Alert & oriented x 3. Cranial nerves grossly intact. Moves all 4 extremities w/o difficulty. Affect pleasant      ASSESSMENT AND PLAN:   1. 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  LVAD parameters stable except for frequent PI events (no low flows) but these are considerably fewer than in the past.  No orthostatic symptoms, NYHA class I-II.   He does not look volume overloaded. MAP controlled.  - He does not need Lasix.      - Continue spironolactone 12.5 mg daily.  - Continue Entresto 97/103 bid.   - Cardiac rehab on hold with coronavirus.    - Continue ASA 81 daily.  - Continue warfarin for INR 2-2.5.  No evidence for GI bleeding. CBC today.  - Should be transplant candidate eventually if he can stay off cigarettes, still smoking 1-2 a day but using nicotine patches.   - Pending BMET and LDH today.  2. Smoking: Still smoking 1-2 cigs/day. Has cut back a lot using nicotine patches.  Knows he needs to quit to be a transplant candidate and working on it.  3. RLE DVT: On warfarin for LVAD.  4. OSA: Continue CPAP.  5. Hyperlipidemia: Atorvastatin.  6. Type II diabetes: Metformin, per PCP.  7. Driveline infection: Currently on doxycycline for 1 more week.   Followup 1 month.   Loralie Champagne 02/10/2019 1:23 PM

## 2019-02-12 ENCOUNTER — Ambulatory Visit (HOSPITAL_COMMUNITY): Payer: Medicare Other

## 2019-02-12 ENCOUNTER — Other Ambulatory Visit (HOSPITAL_COMMUNITY): Payer: Self-pay | Admitting: Unknown Physician Specialty

## 2019-02-12 DIAGNOSIS — Z95811 Presence of heart assist device: Secondary | ICD-10-CM

## 2019-02-12 DIAGNOSIS — Z7901 Long term (current) use of anticoagulants: Secondary | ICD-10-CM

## 2019-02-15 ENCOUNTER — Ambulatory Visit (HOSPITAL_COMMUNITY): Payer: Medicare Other

## 2019-02-16 ENCOUNTER — Other Ambulatory Visit (HOSPITAL_COMMUNITY): Payer: Self-pay | Admitting: Cardiology

## 2019-02-17 ENCOUNTER — Ambulatory Visit (HOSPITAL_COMMUNITY): Payer: Medicare Other

## 2019-02-17 ENCOUNTER — Ambulatory Visit (HOSPITAL_COMMUNITY)
Admission: RE | Admit: 2019-02-17 | Discharge: 2019-02-17 | Disposition: A | Discharge status: Home / Self Care | Payer: Medicare HMO | Source: Ambulatory Visit | Attending: Cardiology | Admitting: Cardiology

## 2019-02-17 ENCOUNTER — Encounter (HOSPITAL_COMMUNITY): Payer: Self-pay

## 2019-02-17 ENCOUNTER — Other Ambulatory Visit: Payer: Self-pay

## 2019-02-17 ENCOUNTER — Ambulatory Visit (HOSPITAL_COMMUNITY): Payer: Self-pay | Admitting: Pharmacist

## 2019-02-17 DIAGNOSIS — Z7901 Long term (current) use of anticoagulants: Secondary | ICD-10-CM | POA: Insufficient documentation

## 2019-02-17 DIAGNOSIS — Z95811 Presence of heart assist device: Secondary | ICD-10-CM | POA: Diagnosis not present

## 2019-02-17 DIAGNOSIS — T827XXD Infection and inflammatory reaction due to other cardiac and vascular devices, implants and grafts, subsequent encounter: Secondary | ICD-10-CM | POA: Diagnosis present

## 2019-02-17 DIAGNOSIS — T829XXD Unspecified complication of cardiac and vascular prosthetic device, implant and graft, subsequent encounter: Secondary | ICD-10-CM

## 2019-02-17 DIAGNOSIS — Z4800 Encounter for change or removal of nonsurgical wound dressing: Secondary | ICD-10-CM | POA: Diagnosis not present

## 2019-02-17 LAB — PROTIME-INR
INR: 2.1 — ABNORMAL HIGH (ref 0.8–1.2)
Prothrombin Time: 23.2 seconds — ABNORMAL HIGH (ref 11.4–15.2)

## 2019-02-17 MED ORDER — DOXYCYCLINE HYCLATE 50 MG PO CAPS: 100.0000 mg | ORAL_CAPSULE | Freq: Every day | ORAL | 3 refills | Status: DC

## 2019-02-17 NOTE — Progress Notes (Addendum)
Patient presents for one week f/u with wound check per Dr. Prescott Gum in [facility] today.  Reports no problems with VAD equipment or concerns with drive line. Denies dizziness, falls, shortness of breath.   Drive line site care today per Dr. Prescott Gum. Patient completed course of Doxycycline. Will continue Doxycycline 126m daily for suppression per Dr. VPrescott Gum    Vital Signs:  Doppler Pressure:  110    (Patient has not taken his morning medications yet) Automatc BP:  104/79 (96) HR: 71 SPO2: UTO  Weight:  lb w/o eqt Last weight: 184.6 lb Home weights:  lbs  VAD Indication: Destination Therapy due to smoking status  VAD interrogation & Equipment Management: Speed: 5700 Flow: 4.6 Power:  4.4w    PI: 3.8 Hct: 33  Alarms: few low voltage Events:  21 PI today, 80+ yesterday (minimal true suction) Fixed speed: 5700 Low speed limit: 5400  I reviewed the LVAD parameters from today and compared the results to the patient's prior recorded data. LVAD interrogation was NEGATIVE for significant power changes, NEGATIVE for clinical alarms and POSITIVE for PI events/speed drops. No programming changes were made and pump is functioning within specified parameters. Pt is performing daily controller and system monitor self tests along with completing weekly and monthly maintenance for LVAD equipment.  LVAD equipment check completed and is in good working order. Back-up equipment present.   Exit Site Care: Drive [facility]. VPrescott Gumperformed dressing change today.  Existing dressing removed using sterile technique. Site cleansed with betadine swab x 2. Aquacel silver strip packed in wound bed. Saline moistened 2x2 placed on top of silver strip. Topped with dressing from daily kit. Continue with daily silver strip, wet to dry dressing changes.  Pt provided adhesive remover pads. Drive line anchor intact.      BP & Labs: Doppler 110 and is  reflective of Modified systolic.   Hgb not drawn today - No S/S of bleeding. Specifically denies melena/BRBPR or nosebleeds.  LDH stable at not drawn today with established baseline of 175 - 255. Denies tea-colored urine. No power elevations noted on interrogation.   Patient Instructions: 1. Doxycycline 1066mdaily (suppression) 2. Daily dressings changes with silver strip and wet to dry.  3. Continue current Coumadin dosing.  [facility]. [facility] wound check with Dr. VaPrescott Gum  AlEmerson MonteN VAEmporiumoordinator  Office: [phone] Pager: [phone]

## 2019-02-17 NOTE — Addendum Note (Signed)
Encounter addended by: Bernita Raisin, RN on: 02/17/2019 9:13 AM  Actions taken: Clinical Note Signed

## 2019-02-19 ENCOUNTER — Ambulatory Visit (HOSPITAL_COMMUNITY): Payer: Medicare Other

## 2019-02-19 ENCOUNTER — Telehealth (HOSPITAL_COMMUNITY): Payer: Self-pay | Admitting: Licensed Clinical Social Worker

## 2019-02-19 NOTE — Telephone Encounter (Signed)
Message left for return call to review needs for Covid 19 public health concerns and emergency VAD pager number. Jackie Halena Mohar, LCSW, CCSW-MCS [phone]  

## 2019-02-22 ENCOUNTER — Ambulatory Visit (HOSPITAL_COMMUNITY): Payer: Medicare Other

## 2019-02-24 ENCOUNTER — Encounter (HOSPITAL_COMMUNITY): Payer: Self-pay

## 2019-02-24 ENCOUNTER — Ambulatory Visit (HOSPITAL_COMMUNITY)
Admission: RE | Admit: 2019-02-24 | Discharge: 2019-02-24 | Disposition: A | Discharge status: Home / Self Care | Payer: Medicare HMO | Source: Ambulatory Visit | Attending: Internal Medicine | Admitting: Internal Medicine

## 2019-02-24 ENCOUNTER — Other Ambulatory Visit: Payer: Self-pay

## 2019-02-24 ENCOUNTER — Ambulatory Visit (HOSPITAL_COMMUNITY): Payer: Medicare Other

## 2019-02-24 ENCOUNTER — Other Ambulatory Visit (HOSPITAL_COMMUNITY): Payer: Self-pay | Admitting: *Deleted

## 2019-02-24 ENCOUNTER — Ambulatory Visit (HOSPITAL_COMMUNITY): Payer: Self-pay | Admitting: Pharmacist

## 2019-02-24 DIAGNOSIS — Z4800 Encounter for change or removal of nonsurgical wound dressing: Secondary | ICD-10-CM | POA: Insufficient documentation

## 2019-02-24 DIAGNOSIS — Z7901 Long term (current) use of anticoagulants: Secondary | ICD-10-CM | POA: Insufficient documentation

## 2019-02-24 DIAGNOSIS — F17200 Nicotine dependence, unspecified, uncomplicated: Secondary | ICD-10-CM | POA: Insufficient documentation

## 2019-02-24 DIAGNOSIS — Z95811 Presence of heart assist device: Secondary | ICD-10-CM

## 2019-02-24 LAB — PROTIME-INR
INR: 2.9 — ABNORMAL HIGH (ref 0.8–1.2)
Prothrombin Time: 29.7 seconds — ABNORMAL HIGH (ref 11.4–15.2)

## 2019-02-24 NOTE — Addendum Note (Signed)
Encounter addended by: Levonne Spiller, RN on: 02/24/2019 9:03 AM  Actions taken: Vitals modified, Order Reconciliation Section accessed, Home Medications modified, Medication taking status modified

## 2019-02-24 NOTE — Progress Notes (Signed)
Patient presents for one week f/u with wound check per Dr. Donata [facility] today by himself due to Covid-19 restrictions. Reports no problems with VAD equipment or concerns with drive line. Denies any falls, dizziness, lightheadedness, or syncope.   Pt has not taken am meds this morning.   Vital Signs:  Doppler Pressure:  120 Automatc BP:  104/73 (89) HR: 78 SPO2:100 % RA  Weight: 185.4 lb w/o eqt Last weight: 178.9  lb   VAD Indication: Destination Therapy due to smoking status  VAD interrogation & Equipment Management: Speed: 5700 Flow: 4.3 Power:  4.4w    PI: 4.6 Hct: 33  Alarms: no clinical alarms Events:  > 100 PI events yesterday Fixed speed: 5700 Low speed limit: 5400 Hct: 33  Primary Controller:  Replace back up battery in 24 months. Back up controller:   Replace back up battery in 26 months.  Annual Equipment Maintenance on UBC/PM was performed on 07/2018.   I reviewed the LVAD parameters from today and compared the results to the patient's prior recorded data. LVAD interrogation was NEGATIVE for significant power changes, NEGATIVE for clinical alarms and POSITIVE for PI events/speed drops. No programming changes were made and pump is functioning within specified parameters. Pt is performing daily controller and system monitor self tests along with completing weekly and monthly maintenance for LVAD equipment.  LVAD equipment check completed and is in good working order. Back-up equipment present.   Exit Site Care: Drive line is being maintained daily by [name]. Dr. Donata Clay performed dressing change today.   Existing dressing removed using sterile technique. Site cleansed with betadine swab x 2. Per Dr. Maren Beach, site with complete incorporation, no tunneling noted. Silver strip with saline saturated gauze over site, dry gauze and occlusive tape. Pt provided with 9 daily dressing kits and skin adhesive remover. Instructed to continue daily dressing  changes. Anchor replaced.  Patient Instructions: 1. Return to [facility] in one week for wound check with Dr. Donata Clay.   Hessie Diener RN VAD Coordinator  Office: [phone]  24/7 Pager: [phone]

## 2019-02-24 NOTE — Addendum Note (Signed)
Encounter addended by: Levonne Spiller, RN on: 02/24/2019 3:31 PM  Actions taken: Clinical Note Signed

## 2019-02-24 NOTE — Addendum Note (Signed)
Encounter addended by: Levonne Spiller, RN on: 02/24/2019 12:47 PM  Actions taken: Pend clinical note

## 2019-02-26 ENCOUNTER — Ambulatory Visit (HOSPITAL_COMMUNITY): Payer: Medicare Other

## 2019-02-26 ENCOUNTER — Other Ambulatory Visit (HOSPITAL_COMMUNITY): Payer: Self-pay | Admitting: *Deleted

## 2019-02-26 DIAGNOSIS — Z7901 Long term (current) use of anticoagulants: Secondary | ICD-10-CM

## 2019-02-26 DIAGNOSIS — Z95811 Presence of heart assist device: Secondary | ICD-10-CM

## 2019-03-01 ENCOUNTER — Ambulatory Visit (HOSPITAL_COMMUNITY): Payer: Medicare Other

## 2019-03-03 ENCOUNTER — Ambulatory Visit (HOSPITAL_COMMUNITY): Payer: Medicare Other

## 2019-03-03 ENCOUNTER — Other Ambulatory Visit: Payer: Self-pay

## 2019-03-03 ENCOUNTER — Encounter (HOSPITAL_COMMUNITY): Payer: Self-pay

## 2019-03-03 ENCOUNTER — Ambulatory Visit (HOSPITAL_COMMUNITY)
Admission: RE | Admit: 2019-03-03 | Discharge: 2019-03-03 | Disposition: A | Discharge status: Home / Self Care | Payer: Medicare HMO | Source: Ambulatory Visit | Attending: Cardiology | Admitting: Cardiology

## 2019-03-03 ENCOUNTER — Ambulatory Visit (HOSPITAL_COMMUNITY): Payer: Self-pay | Admitting: Pharmacist

## 2019-03-03 DIAGNOSIS — Z7901 Long term (current) use of anticoagulants: Secondary | ICD-10-CM | POA: Diagnosis not present

## 2019-03-03 DIAGNOSIS — Z95811 Presence of heart assist device: Secondary | ICD-10-CM | POA: Diagnosis present

## 2019-03-03 LAB — PROTIME-INR
INR: 1.6 — ABNORMAL HIGH (ref 0.8–1.2)
Prothrombin Time: 19 seconds — ABNORMAL HIGH (ref 11.4–15.2)

## 2019-03-03 NOTE — Progress Notes (Addendum)
Patient presents for one week f/u with wound check per Dr. Donata [facility] in [facility] today by himself due to Covid-19 restrictions. Reports no problems with VAD equipment or concerns with drive line. Denies any falls, dizziness, lightheadedness, or syncope.   Pt has taken am meds this morning.   Vital Signs:  Doppler Pressure:  100 Automatc BP:  107/70 (84)  HR: 76 SPO2:100 % RA  Weight: 186.8 lb w/o eqt Last weight: 185.4lb   VAD Indication: Destination Therapy due to smoking status  VAD interrogation & Equipment Management: Speed: 5700 Flow: 4.3 Power:  4.3w    PI: 4.9 Hct: 33  Alarms: no clinical alarms Events:  > 80 PI events yesterday Fixed speed: 5700 Low speed limit: 5400 Hct: 33  Primary Controller:  Replace back up battery in 24 months. Back up controller:   Replace back up battery in 26 months.  Annual Equipment Maintenance on UBC/PM was performed on 07/2018.   I reviewed the LVAD parameters from today and compared the results to the patient's prior recorded data. LVAD interrogation was NEGATIVE for significant power changes, NEGATIVE for clinical alarms and POSITIVE for PI events/speed drops. No programming changes were made and pump is functioning within specified parameters. Pt is performing daily controller and system monitor self tests along with completing weekly and monthly maintenance for LVAD equipment.  LVAD equipment check completed and is in good working order. Back-up equipment present.   Exit Site Care: Drive line is being maintained daily by [name]. Dr. Donata Clay performed dressing change today.   Existing dressing removed using sterile technique. Site cleansed with betadine swab x 2. Per Dr. Maren Beach, site with complete incorporation, no tunneling noted. Silver strip with saline saturated gauze over site, dry gauze and occlusive tape. Pt provided with 12 daily dressing kits and 15 sterile saline flushes. Instructed to continue daily dressing  changes. Anchor replaced.        Patient Instructions: 1. Return to [facility] in 2 weeks for wound check with Dr. Donata Clay. 2. Continue Doxycycline for 1 more week.    Alyce Pagan RN VAD Coordinator  Office: [phone]  24/7 Pager: [phone]

## 2019-03-03 NOTE — Patient Instructions (Signed)
1. Continue dressing changes packing silver strip into wound bed. Top with moist gauze and daily dressing kit.  2. Continue Doxycycline for 1 more week, then stop.  3. Theodore Welch will be in touch regarding Coumadin dosing.  4. Return to [facility] in 2 weeks.

## 2019-03-11 ENCOUNTER — Other Ambulatory Visit (HOSPITAL_COMMUNITY): Payer: Self-pay | Admitting: *Deleted

## 2019-03-11 DIAGNOSIS — Z7901 Long term (current) use of anticoagulants: Secondary | ICD-10-CM

## 2019-03-11 DIAGNOSIS — Z95811 Presence of heart assist device: Secondary | ICD-10-CM

## 2019-03-17 ENCOUNTER — Other Ambulatory Visit: Payer: Self-pay

## 2019-03-17 ENCOUNTER — Encounter (HOSPITAL_COMMUNITY): Payer: Self-pay

## 2019-03-17 ENCOUNTER — Ambulatory Visit (HOSPITAL_COMMUNITY): Payer: Self-pay | Admitting: Pharmacist

## 2019-03-17 ENCOUNTER — Ambulatory Visit (HOSPITAL_COMMUNITY)
Admission: RE | Admit: 2019-03-17 | Discharge: 2019-03-17 | Disposition: A | Discharge status: Home / Self Care | Payer: Medicare HMO | Source: Ambulatory Visit | Attending: Cardiology | Admitting: Cardiology

## 2019-03-17 VITALS — BP 104/0 | HR 71 | Temp 98.2°F | Ht 70.0 in | Wt 187.8 lb

## 2019-03-17 DIAGNOSIS — G4733 Obstructive sleep apnea (adult) (pediatric): Secondary | ICD-10-CM | POA: Diagnosis not present

## 2019-03-17 DIAGNOSIS — K7030 Alcoholic cirrhosis of liver without ascites: Secondary | ICD-10-CM | POA: Insufficient documentation

## 2019-03-17 DIAGNOSIS — I5022 Chronic systolic (congestive) heart failure: Secondary | ICD-10-CM

## 2019-03-17 DIAGNOSIS — E785 Hyperlipidemia, unspecified: Secondary | ICD-10-CM | POA: Insufficient documentation

## 2019-03-17 DIAGNOSIS — Z95811 Presence of heart assist device: Secondary | ICD-10-CM | POA: Diagnosis not present

## 2019-03-17 DIAGNOSIS — Z7984 Long term (current) use of oral hypoglycemic drugs: Secondary | ICD-10-CM | POA: Insufficient documentation

## 2019-03-17 DIAGNOSIS — Z79899 Other long term (current) drug therapy: Secondary | ICD-10-CM | POA: Diagnosis not present

## 2019-03-17 DIAGNOSIS — Z86718 Personal history of other venous thrombosis and embolism: Secondary | ICD-10-CM | POA: Insufficient documentation

## 2019-03-17 DIAGNOSIS — Z7982 Long term (current) use of aspirin: Secondary | ICD-10-CM | POA: Insufficient documentation

## 2019-03-17 DIAGNOSIS — K219 Gastro-esophageal reflux disease without esophagitis: Secondary | ICD-10-CM | POA: Diagnosis not present

## 2019-03-17 DIAGNOSIS — E119 Type 2 diabetes mellitus without complications: Secondary | ICD-10-CM | POA: Diagnosis not present

## 2019-03-17 DIAGNOSIS — Z7901 Long term (current) use of anticoagulants: Secondary | ICD-10-CM | POA: Insufficient documentation

## 2019-03-17 DIAGNOSIS — I420 Dilated cardiomyopathy: Secondary | ICD-10-CM | POA: Diagnosis not present

## 2019-03-17 DIAGNOSIS — Z4801 Encounter for change or removal of surgical wound dressing: Secondary | ICD-10-CM | POA: Diagnosis not present

## 2019-03-17 DIAGNOSIS — Z87891 Personal history of nicotine dependence: Secondary | ICD-10-CM | POA: Insufficient documentation

## 2019-03-17 LAB — CBC
HCT: 37.5 % — ABNORMAL LOW (ref 39.0–52.0)
Hemoglobin: 11.4 g/dL — ABNORMAL LOW (ref 13.0–17.0)
MCH: 24.4 pg — ABNORMAL LOW (ref 26.0–34.0)
MCHC: 30.4 g/dL (ref 30.0–36.0)
MCV: 80.3 fL (ref 80.0–100.0)
Platelets: 206 10*3/uL (ref 150–400)
RBC: 4.67 MIL/uL (ref 4.22–5.81)
RDW: 16.7 % — ABNORMAL HIGH (ref 11.5–15.5)
WBC: 7.8 10*3/uL (ref 4.0–10.5)
nRBC: 0 % (ref 0.0–0.2)

## 2019-03-17 LAB — BASIC METABOLIC PANEL
Anion gap: 11 (ref 5–15)
BUN: 14 mg/dL (ref 6–20)
CO2: 22 mmol/L (ref 22–32)
Calcium: 9.4 mg/dL (ref 8.9–10.3)
Chloride: 108 mmol/L (ref 98–111)
Creatinine, Ser: 1.15 mg/dL (ref 0.61–1.24)
GFR calc Af Amer: >60 mL/min (ref >60)
GFR calc non Af Amer: >60 mL/min (ref >60)
Glucose, Bld: 95 mg/dL (ref 70–99)
Potassium: 4 mmol/L (ref 3.5–5.1)
Sodium: 141 mmol/L (ref 135–145)

## 2019-03-17 LAB — LACTATE DEHYDROGENASE: LDH: 199 U/L — ABNORMAL HIGH (ref 98–192)

## 2019-03-17 LAB — PROTIME-INR
INR: 1.9 — ABNORMAL HIGH (ref 0.8–1.2)
Prothrombin Time: 21.5 seconds — ABNORMAL HIGH (ref 11.4–15.2)

## 2019-03-17 NOTE — Progress Notes (Addendum)
Patient presents for hospital  follow up in VAD Clinic today following his VAD implant on 08/03/18. Reports no problems with VAD equipment or concerns with drive line.  Pt presents to VAD clinic today for dressing change per Dr. Maren BeachVanTrigt and 1 mo f/u with Dr. Shirlee LatchMcLean.  Pt reports he is feeling well and has no physical limitations at this time. He is staying busy keeping his 592 yr old grandson.   Pt completed his Doxy for drive line infection on 1/61/094/22/20, reports no changes in drainage to date.   Vital Signs:  Ht: 5/10" Temp: 187.8 Doppler Pressure: 104  Automatc BP: 112/48 (90) HR: 71 SPO2: 100%  Weight: 187.8 lb w/o eqt Last weight: 184.6 lb Home weights: 180 - 187  VAD Indication: Destination Therapy    VAD interrogation & Equipment Management: Speed: 5700 Flow: 4.3 Power:  4.5w    PI: 4.9  Alarms: no clinical alarms Events:  40 - 50 daily  Fixed speed: 5700 Low speed limit: 5400  Primary Controller:  Replace back up battery in 24 months. Back up controller:   Replace back up battery in 24  months.  Annual Equipment Maintenance on UBC/PM was performed on 07/2018.   I reviewed the LVAD parameters from today and compared the results to the patient's prior recorded data. LVAD interrogation was NEGATIVE for significant power changes, NEGATIVE for clinical alarms and STABLE for PI events/speed drops. No programming changes were made and pump is functioning within specified parameters. Pt is performing daily controller and system monitor self tests along with completing weekly and monthly maintenance for LVAD equipment.  LVAD equipment check completed and is in good working order. Back-up equipment present.   Exit Site Care: Drive line is being maintained daily by [employer].  Existing VAD dressing removed and site care performed using sterile technique per Dr. Maren BeachVanTrigt. Drive line exit site cleaned with betadine swabs x 2, allowed to dry, and gauze dressing with silver  strip re-applied. Exit site healing and unincorporated, the velour is exposed 1 -2 cm. Yellow drainage with no foul odor; no redness, tenderness, or rash noted. Drive line anchor re-applied. Pt denies fever or chills. Will continue daily dressing changes per Dr. Maren BeachVanTrigt, will hold placing moisture on dressing. Will see patient back in two weeks for dressing change. Pt will call us with his dressing supply needs, "left my list at home".  Wife called with list of dressing needs; pt supplied with 10 daily kits, 20 anchors, adhesive remover, and sterile applicators. Informed wife pt no longer needs sterile saline added to dressing routine. She verbalized understanding of same.    BP & Labs:  MAP 104 - Doppler is reflecting modified systolic  Hgb 11.4- No S/S of bleeding. Specifically denies melena/BRBPR or nosebleeds.  LDH stable at 199 with established baseline of 175 - 255. Denies tea-colored urine. No power elevations noted on interrogation.    Plan: 1. Continue daily dressing changes; omit the saline application to gauze. 2. Return to VAD clinic in two weeks for dressing change per Dr. Maren BeachVanTrigt 3. No change in meds. 4. Return to VAD Clinic in 2 months for full clinic visit with Dr. Shirlee LatchMcLean.   Hessie DienerMolly Reece RN VAD Coordinator  Office: [phone] 24/7 Emergency VAD Pager: [phone]    58 y.o. with history of nonischemic cardiomyopathy, RLE DVT, cirrhosis, smoking, and OSA returns for followup of CHF/LVAD placement.  [place].  He was admitted  to [facility] in [facility]/19 with low output HF and was started on milrinone and diuresed.  Unable to wean off milrinone.  He had a degree of RV failure, but this improved on milrinone.  Valvular heart disease also looked better with milrinone and diuresis.  On 08/03/18, he had Heartmate 3 LVAD placed.  Speed was optimized by ramp echo post-op.  Post-op course was relatively  unremarkable.  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     Recently, he has been doing well symptomatically.  No significant exertional dyspnea, very active in general.  No lightheaded spells. No orthopnea/PND.  He still has multiple PI events/day (up to 50) but no low flows.  He is now smoking only a cigarette every couple of days.    Labs (9/19): LDH 210, INR 2.17, WBCs 17.1 => 15, hgb 9.7, creatinine 0.72 Labs (10/19): K 4.3, creatinine 0.78 => 0.83, hgb 10.2 Labs (11/19): creatinine 0.79, hgb 10 Labs (2/20): K 3.9, creatinine 0.79 Labs (4/20): K 4, creatinine 1.15, hgb 11.4, LDH 199, INR 1.9  Denies LVAD alarms.  Denies driveline trauma, erythema or drainage.  Denies ICD shocks.   Reports taking Coumadin as prescribed and adherence to anticoagulation based dietary restrictions.  Denies bright red blood per rectum or melena, no dark urine or hematuria.    PMH: 1. Degenerative disc disease.  2. GERD 3. Chronic systolic CHF:  Nonischemic cardiomyopathy.  Dilated cardiomyopathy diagnosed 6/19 in [place].  LHC/RHC in 7/19 showed elevated filling pressures, low cardiac output, and no significant CAD.   - Echo (9/19): Severe LV dilation with EF 10-20%, moderate-severe MR, severely dilated RV with mildly decreased systolic function, severe TR.  - Cardiac MRI (9/19): EF 14%, moderate dilated LV, severely dilated RV with mod-severe systolic function and EF 25%, nonspecific RV insertion site LGE.  - RHC (5/19) on milrinone 0.375: mean RA 8, PA 40/19, mean PCWP 12, PAPi 2.65, CI 2.71.  - Echo (9/19, on milrinone and diuresed): EF 20-25% with moderate LV dilation, moderately dilated RV with mildly decreased systolic function, mild-moderate MR, moderate TR, PASP 51 mmHg.  Cannot rule out noncompaction.  - Heartmate 3 LVAD placement in 9/19.  4. OSA: CPAP use.  5. Prior smoker 6. Type 2 diabetes 7. Hyperlipidemia.  8. H/o NSVT 9. 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  10. RLE DVT: found in 9/19.  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   Current Outpatient Medications  Medication Sig Dispense Refill  . amLODipine (NORVASC) 5 MG tablet Take 2 tablets (10 mg total) by mouth daily. 60 tablet 6  . aspirin EC 81 MG tablet Take 81 mg by mouth daily.    Marland Kitchen atorvastatin (LIPITOR) 20 MG tablet Take 1 tablet (20 mg total) by mouth daily. 30 tablet 6  . carbamide peroxide (DEBROX) 6.5 % OTIC solution Place 5 drops into both ears 3 (three) times daily. 15 mL 0  . cyanocobalamin 500 MCG tablet Take 1,000 mcg by mouth daily.     Marland Kitchen docusate sodium (COLACE) 100 MG capsule Take 2 capsules (200 mg total) by mouth daily as needed for mild constipation. 10 capsule 0  . DULoxetine (CYMBALTA) 60 MG capsule Take 60 mg by mouth daily.    . fluticasone (FLONASE) 50 MCG/ACT nasal spray Place 1 spray into both nostrils 2 (two) times daily as needed for allergies.     Marland Kitchen gabapentin (NEURONTIN) 300 MG capsule Take 600 mg by mouth 3 (three) times daily.    Marland Kitchen  glucose blood (CONTOUR NEXT TEST) test strip Use as instructed 100 each 12  . hydrOXYzine (VISTARIL) 25 MG capsule Take 25 mg by mouth 3 (three) times daily as needed for anxiety.    . Ipratropium-Albuterol (COMBIVENT) 20-100 MCG/ACT AERS respimat Inhale 1 puff into the lungs every 6 (six) hours.    . Lancets (FREESTYLE) lancets Use as instructed 100 each 12  . metFORMIN (GLUCOPHAGE-XR) 500 MG 24 hr tablet Take 500 mg by mouth 2 (two) times daily.    . methocarbamol (ROBAXIN) 750 MG tablet Take 750 mg by mouth 3 (three) times daily as needed for muscle spasms.     . nicotine (NICODERM CQ - DOSED IN MG/24 HR) 7 mg/24hr patch Place 1 patch (7 mg total) onto the skin daily as needed (tobacco craving). 28 patch 0  . nystatin (MYCOSTATIN/NYSTOP) powder Use with VAD dressing changes daily, advance as directed. 15 g 3  . omeprazole (PRILOSEC) 20 MG capsule Take 20 mg by mouth daily.    Marland Kitchen oxyCODONE (OXY IR/ROXICODONE) 5 MG  immediate release tablet Take 1 - 2 tabs q 4 - 6 hrs prn pain 30 tablet 0  . polyethylene glycol (MIRALAX / GLYCOLAX) packet Take 17 g by mouth daily as needed for mild constipation.    . sacubitril-valsartan (ENTRESTO) 49-51 MG Take 1 tablet by mouth 2 (two) times daily. 60 tablet 3  . sildenafil (VIAGRA) 100 MG tablet Take 50 mg by mouth daily as needed for erectile dysfunction.    Marland Kitchen spironolactone (ALDACTONE) 25 MG tablet Take 1 tablet (25 mg total) by mouth daily. 30 tablet 6  . thiamine 100 MG tablet Take 100 mg by mouth daily.    . traMADol (ULTRAM) 50 MG tablet Take 1 tablet (50 mg total) by mouth every 4 (four) hours as needed for moderate pain. 30 tablet 0  . warfarin (COUMADIN) 5 MG tablet Take  1 and 1/2 tablets (7.5 mg) Tue and Sa and 1 tab = 5mg  AOD Or as directed by AHF clinic    . Zinc Gluconate 100 MG TABS Take 1 tablet (100 mg total) by mouth daily. 90 tablet 3  . doxycycline (VIBRAMYCIN) 50 MG capsule Take 2 capsules (100 mg total) by mouth daily. (Patient not taking: Reported on 03/17/2019) 56 capsule 3   No current facility-administered medications for this encounter.     Patient has no known allergies.  REVIEW OF SYSTEMS: All systems negative except as listed in HPI, PMH and Problem list.   LVAD INTERROGATION:  Please see LVAD nurse's note above for details.    I reviewed the LVAD parameters from today, and compared the results to the patient's prior recorded data.  No programming changes were made.  The LVAD is functioning within specified parameters.  The patient performs LVAD self-test daily.  LVAD interrogation was negative for any significant power changes, alarms or PI events/speed drops.  LVAD equipment check completed and is in good working order.  Back-up equipment present.   LVAD education done on emergency procedures and precautions and reviewed exit site care.    MAP 88-90  Physical Exam: General: Well appearing this am. NAD.  HEENT: Normal. Neck:  Supple, JVP 7-8 cm. Carotids OK.  Cardiac:  Mechanical heart sounds with LVAD hum present.  Lungs:  CTAB, normal effort.  Abdomen:  NT, ND, no HSM. No bruits or masses. +BS  LVAD exit site: Well-healed and incorporated. Dressing dry and intact. No erythema or drainage. Stabilization device present and accurately applied.  Driveline dressing changed daily per sterile technique. Extremities:  Warm and dry. No cyanosis, clubbing, rash, or edema.  Neuro:  Alert & oriented x 3. Cranial nerves grossly intact. Moves all 4 extremities w/o difficulty. Affect pleasant       ASSESSMENT AND PLAN:   1. 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  LVAD parameters stable except for frequent PI events (no low flows).  This is chronic for him.  No orthostatic symptoms, NYHA class I-II.   He does not look volume overloaded. MAP controlled.  - He does not need Lasix.      - Continue spironolactone 25 mg daily.  - Continue Entresto 97/103 bid.   - Cardiac rehab on hold with coronavirus.    - Continue ASA 81 daily.  - Continue warfarin for INR 2-2.5.  No evidence for GI bleeding. Hgb today is stable.  - Should be transplant candidate eventually if he can stay off cigarettes, he has almost quit. 2. Smoking: Still smoking a couple cigarettes/week. Has cut back a lot using nicotine patches.  Knows he needs to quit to be a transplant candidate and working on it.  3. RLE DVT: On warfarin for LVAD.  4. OSA: Continue CPAP.  5. Hyperlipidemia: Atorvastatin.  6. Type II diabetes: Metformin, per PCP.  7. Driveline infection: Resolved, now off doxycycline.    Marca Ancona 03/18/2019

## 2019-03-18 NOTE — Addendum Note (Signed)
Encounter addended by: Levonne Spiller, RN on: 03/18/2019 12:36 PM  Actions taken: Clinical Note Signed

## 2019-03-18 NOTE — Addendum Note (Signed)
Encounter addended by: Laurey Morale, MD on: 03/18/2019 10:02 PM  Actions taken: Charge Capture section accepted, Clinical Note Signed, Visit diagnoses modified, LOS modified

## 2019-03-19 ENCOUNTER — Telehealth (HOSPITAL_COMMUNITY): Payer: Self-pay | Admitting: Licensed Clinical Social Worker

## 2019-03-19 NOTE — Telephone Encounter (Signed)
CSW contacted patient/caregiver to follow up on status with coronavirus and if anything needed. Patient instructed to stay home and use of proper hygiene and call if needed. CSW shared information about LVAD Support Group meeting virtually on May 4th and obtained email address if interested. Patient denies any concerns at this time and verbalizes understanding of process for contacting VAD Coordinators if needed. CSW continues to follow as needed. Jackie Dez Stauffer, LCSW, CCSW-MCS [phone] 

## 2019-03-26 ENCOUNTER — Other Ambulatory Visit (HOSPITAL_COMMUNITY): Payer: Self-pay | Admitting: *Deleted

## 2019-03-26 DIAGNOSIS — Z95811 Presence of heart assist device: Secondary | ICD-10-CM

## 2019-03-26 DIAGNOSIS — Z7901 Long term (current) use of anticoagulants: Secondary | ICD-10-CM

## 2019-03-30 ENCOUNTER — Telehealth (HOSPITAL_COMMUNITY): Payer: Self-pay | Admitting: *Deleted

## 2019-03-30 NOTE — Telephone Encounter (Signed)
[name] (caregiver) texted picture of pt's drive line last night while performing daily dressing change. Reports it had no looked like this until that dressing change. Pt denies any trauma to site, also denies any fevers or chills, but does report "pain up in chest area". Dr. Donata Clay updated.    Called patient today per Dr. Donata Clay and instructed them to continue daily dressing changes and to pack silver strips into skin under opening. We will see patient in am for dressing change. Pt verbalized understanding of same.   Hessie Diener RN, VAD Coordinator [phone]

## 2019-03-31 ENCOUNTER — Inpatient Hospital Stay (HOSPITAL_COMMUNITY)
Admission: AD | Admit: 2019-03-31 | Discharge: 2019-04-05 | DRG: 315 | Disposition: A | Discharge status: Home Health | Payer: Medicare HMO | Source: Ambulatory Visit | Attending: Cardiology | Admitting: Cardiology

## 2019-03-31 ENCOUNTER — Other Ambulatory Visit (HOSPITAL_COMMUNITY): Payer: Self-pay | Admitting: *Deleted

## 2019-03-31 ENCOUNTER — Other Ambulatory Visit: Payer: Self-pay

## 2019-03-31 ENCOUNTER — Other Ambulatory Visit (HOSPITAL_COMMUNITY): Payer: Medicare HMO

## 2019-03-31 ENCOUNTER — Encounter (HOSPITAL_COMMUNITY): Payer: Self-pay

## 2019-03-31 ENCOUNTER — Ambulatory Visit (HOSPITAL_COMMUNITY)
Admission: RE | Admit: 2019-03-31 | Discharge: 2019-03-31 | Disposition: A | Discharge status: Home / Self Care | Payer: Medicare HMO | Source: Ambulatory Visit | Attending: Internal Medicine | Admitting: Internal Medicine

## 2019-03-31 ENCOUNTER — Ambulatory Visit (HOSPITAL_COMMUNITY): Admission: RE | Admit: 2019-03-31 | Payer: Non-veteran care | Source: Ambulatory Visit

## 2019-03-31 ENCOUNTER — Inpatient Hospital Stay (HOSPITAL_COMMUNITY): Payer: Medicare HMO

## 2019-03-31 VITALS — BP 90/72 | HR 87 | Temp 98.4°F

## 2019-03-31 DIAGNOSIS — Z79899 Other long term (current) drug therapy: Secondary | ICD-10-CM

## 2019-03-31 DIAGNOSIS — Z7901 Long term (current) use of anticoagulants: Secondary | ICD-10-CM | POA: Diagnosis not present

## 2019-03-31 DIAGNOSIS — K7460 Unspecified cirrhosis of liver: Secondary | ICD-10-CM | POA: Diagnosis present

## 2019-03-31 DIAGNOSIS — E119 Type 2 diabetes mellitus without complications: Secondary | ICD-10-CM | POA: Diagnosis present

## 2019-03-31 DIAGNOSIS — R7881 Bacteremia: Secondary | ICD-10-CM | POA: Diagnosis present

## 2019-03-31 DIAGNOSIS — L0390 Cellulitis, unspecified: Secondary | ICD-10-CM | POA: Diagnosis present

## 2019-03-31 DIAGNOSIS — Y838 Other surgical procedures as the cause of abnormal reaction of the patient, or of later complication, without mention of misadventure at the time of the procedure: Secondary | ICD-10-CM | POA: Diagnosis present

## 2019-03-31 DIAGNOSIS — Z7982 Long term (current) use of aspirin: Secondary | ICD-10-CM | POA: Diagnosis not present

## 2019-03-31 DIAGNOSIS — T829XXD Unspecified complication of cardiac and vascular prosthetic device, implant and graft, subsequent encounter: Secondary | ICD-10-CM | POA: Insufficient documentation

## 2019-03-31 DIAGNOSIS — I429 Cardiomyopathy, unspecified: Secondary | ICD-10-CM | POA: Insufficient documentation

## 2019-03-31 DIAGNOSIS — Z9079 Acquired absence of other genital organ(s): Secondary | ICD-10-CM

## 2019-03-31 DIAGNOSIS — E785 Hyperlipidemia, unspecified: Secondary | ICD-10-CM | POA: Diagnosis present

## 2019-03-31 DIAGNOSIS — J449 Chronic obstructive pulmonary disease, unspecified: Secondary | ICD-10-CM | POA: Insufficient documentation

## 2019-03-31 DIAGNOSIS — T827XXA Infection and inflammatory reaction due to other cardiac and vascular devices, implants and grafts, initial encounter: Principal | ICD-10-CM | POA: Diagnosis present

## 2019-03-31 DIAGNOSIS — G4700 Insomnia, unspecified: Secondary | ICD-10-CM | POA: Diagnosis not present

## 2019-03-31 DIAGNOSIS — B9561 Methicillin susceptible Staphylococcus aureus infection as the cause of diseases classified elsewhere: Secondary | ICD-10-CM | POA: Diagnosis present

## 2019-03-31 DIAGNOSIS — I428 Other cardiomyopathies: Secondary | ICD-10-CM | POA: Diagnosis present

## 2019-03-31 DIAGNOSIS — Z86718 Personal history of other venous thrombosis and embolism: Secondary | ICD-10-CM | POA: Diagnosis not present

## 2019-03-31 DIAGNOSIS — G8929 Other chronic pain: Secondary | ICD-10-CM | POA: Diagnosis present

## 2019-03-31 DIAGNOSIS — Z1159 Encounter for screening for other viral diseases: Secondary | ICD-10-CM

## 2019-03-31 DIAGNOSIS — Z95811 Presence of heart assist device: Secondary | ICD-10-CM | POA: Insufficient documentation

## 2019-03-31 DIAGNOSIS — Z7984 Long term (current) use of oral hypoglycemic drugs: Secondary | ICD-10-CM

## 2019-03-31 DIAGNOSIS — Z7951 Long term (current) use of inhaled steroids: Secondary | ICD-10-CM

## 2019-03-31 DIAGNOSIS — G4733 Obstructive sleep apnea (adult) (pediatric): Secondary | ICD-10-CM | POA: Diagnosis present

## 2019-03-31 DIAGNOSIS — Z87891 Personal history of nicotine dependence: Secondary | ICD-10-CM

## 2019-03-31 DIAGNOSIS — Z978 Presence of other specified devices: Secondary | ICD-10-CM | POA: Diagnosis not present

## 2019-03-31 DIAGNOSIS — Z95828 Presence of other vascular implants and grafts: Secondary | ICD-10-CM | POA: Diagnosis not present

## 2019-03-31 DIAGNOSIS — R1013 Epigastric pain: Secondary | ICD-10-CM | POA: Diagnosis not present

## 2019-03-31 DIAGNOSIS — Z79891 Long term (current) use of opiate analgesic: Secondary | ICD-10-CM | POA: Diagnosis not present

## 2019-03-31 DIAGNOSIS — I5022 Chronic systolic (congestive) heart failure: Secondary | ICD-10-CM | POA: Diagnosis present

## 2019-03-31 DIAGNOSIS — D72829 Elevated white blood cell count, unspecified: Secondary | ICD-10-CM | POA: Diagnosis not present

## 2019-03-31 DIAGNOSIS — Z4801 Encounter for change or removal of surgical wound dressing: Secondary | ICD-10-CM | POA: Insufficient documentation

## 2019-03-31 LAB — BLOOD CULTURE ID PANEL (REFLEXED)

## 2019-03-31 LAB — COMPREHENSIVE METABOLIC PANEL
ALT: 14 U/L (ref 0–44)
AST: 19 U/L (ref 15–41)
Albumin: 3.1 g/dL — ABNORMAL LOW (ref 3.5–5.0)
Alkaline Phosphatase: 132 U/L — ABNORMAL HIGH (ref 38–126)
Anion gap: 13 (ref 5–15)
BUN: 10 mg/dL (ref 6–20)
CO2: 23 mmol/L (ref 22–32)
Calcium: 9 mg/dL (ref 8.9–10.3)
Chloride: 97 mmol/L — ABNORMAL LOW (ref 98–111)
Creatinine, Ser: 1.25 mg/dL — ABNORMAL HIGH (ref 0.61–1.24)
GFR calc Af Amer: >60 mL/min (ref >60)
GFR calc non Af Amer: >60 mL/min (ref >60)
Glucose, Bld: 125 mg/dL — ABNORMAL HIGH (ref 70–99)
Potassium: 3.6 mmol/L (ref 3.5–5.1)
Sodium: 133 mmol/L — ABNORMAL LOW (ref 135–145)
Total Bilirubin: 1.2 mg/dL (ref 0.3–1.2)
Total Protein: 7.3 g/dL (ref 6.5–8.1)

## 2019-03-31 LAB — CBC
HCT: 39.2 % (ref 39.0–52.0)
Hemoglobin: 11.5 g/dL — ABNORMAL LOW (ref 13.0–17.0)
MCH: 23.9 pg — ABNORMAL LOW (ref 26.0–34.0)
MCHC: 29.3 g/dL — ABNORMAL LOW (ref 30.0–36.0)
MCV: 81.5 fL (ref 80.0–100.0)
Platelets: 232 10*3/uL (ref 150–400)
RBC: 4.81 MIL/uL (ref 4.22–5.81)
RDW: 16.4 % — ABNORMAL HIGH (ref 11.5–15.5)
WBC: 19.9 10*3/uL — ABNORMAL HIGH (ref 4.0–10.5)
nRBC: 0 % (ref 0.0–0.2)

## 2019-03-31 LAB — GLUCOSE, CAPILLARY: Glucose-Capillary: 134 mg/dL — ABNORMAL HIGH (ref 70–99)

## 2019-03-31 LAB — PROTIME-INR
INR: 1.9 — ABNORMAL HIGH (ref 0.8–1.2)
INR: 2.1 — ABNORMAL HIGH (ref 0.8–1.2)
Prothrombin Time: 21.7 seconds — ABNORMAL HIGH (ref 11.4–15.2)
Prothrombin Time: 23.1 seconds — ABNORMAL HIGH (ref 11.4–15.2)

## 2019-03-31 LAB — MRSA PCR SCREENING: MRSA by PCR: NEGATIVE

## 2019-03-31 LAB — LACTATE DEHYDROGENASE: LDH: 148 U/L (ref 98–192)

## 2019-03-31 LAB — SARS CORONAVIRUS 2 BY RT PCR (HOSPITAL ORDER, PERFORMED IN ~~LOC~~ HOSPITAL LAB): SARS Coronavirus 2: NEGATIVE

## 2019-03-31 MED ORDER — PIPERACILLIN-TAZOBACTAM 3.375 G IVPB
3.3750 g | Freq: Three times a day (TID) | INTRAVENOUS | Status: DC
  Administered 2019-03-31: 3.375 g via INTRAVENOUS
  Filled 2019-03-31 (×4): qty 50

## 2019-03-31 MED ORDER — FLUTICASONE PROPIONATE 50 MCG/ACT NA SUSP: 1.0000 | Freq: Two times a day (BID) | NASAL | Status: DC | PRN

## 2019-03-31 MED ORDER — DULOXETINE HCL 60 MG PO CPEP
60.0000 mg | ORAL_CAPSULE | Freq: Every day | ORAL | Status: DC
  Administered 2019-03-31 – 2019-04-05 (×6): 60 mg via ORAL
  Filled 2019-03-31 (×6): qty 1

## 2019-03-31 MED ORDER — VITAMIN B-1 100 MG PO TABS
100.0000 mg | ORAL_TABLET | Freq: Every day | ORAL | Status: DC
  Administered 2019-03-31 – 2019-04-05 (×7): 100 mg via ORAL
  Filled 2019-03-31 (×7): qty 1

## 2019-03-31 MED ORDER — WARFARIN SODIUM 7.5 MG PO TABS: 7.5000 mg | ORAL_TABLET | Freq: Once | ORAL | Status: DC

## 2019-03-31 MED ORDER — HYDROXYZINE PAMOATE 25 MG PO CAPS
25.0000 mg | ORAL_CAPSULE | Freq: Three times a day (TID) | ORAL | Status: DC | PRN
  Administered 2019-03-31: 25 mg via ORAL
  Filled 2019-03-31 (×2): qty 1

## 2019-03-31 MED ORDER — IOHEXOL 300 MG/ML  SOLN
100.0000 mL | Freq: Once | INTRAMUSCULAR | Status: AC | PRN
  Administered 2019-03-31: 100 mL via INTRAVENOUS

## 2019-03-31 MED ORDER — OXYCODONE HCL 5 MG PO TABS
5.0000 mg | ORAL_TABLET | Freq: Four times a day (QID) | ORAL | Status: DC | PRN
  Administered 2019-03-31 – 2019-04-03 (×7): 5 mg via ORAL
  Filled 2019-03-31 (×7): qty 1

## 2019-03-31 MED ORDER — CARBAMIDE PEROXIDE 6.5 % OT SOLN
5.0000 [drp] | Freq: Three times a day (TID) | OTIC | Status: DC
  Administered 2019-04-01 – 2019-04-05 (×8): 5 [drp] via OTIC
  Filled 2019-03-31: qty 15

## 2019-03-31 MED ORDER — ONDANSETRON HCL 4 MG/2ML IJ SOLN: 4.0000 mg | Freq: Four times a day (QID) | INTRAMUSCULAR | Status: DC | PRN

## 2019-03-31 MED ORDER — METHOCARBAMOL 500 MG PO TABS
750.0000 mg | ORAL_TABLET | Freq: Three times a day (TID) | ORAL | Status: DC | PRN
  Administered 2019-04-03: 750 mg via ORAL
  Filled 2019-03-31: qty 2

## 2019-03-31 MED ORDER — NICOTINE 7 MG/24HR TD PT24
7.0000 mg | MEDICATED_PATCH | Freq: Every day | TRANSDERMAL | Status: DC | PRN
  Filled 2019-03-31: qty 1

## 2019-03-31 MED ORDER — VANCOMYCIN HCL 10 G IV SOLR
2000.0000 mg | Freq: Once | INTRAVENOUS | Status: AC
  Administered 2019-03-31: 2000 mg via INTRAVENOUS
  Filled 2019-03-31: qty 2000

## 2019-03-31 MED ORDER — IPRATROPIUM-ALBUTEROL 20-100 MCG/ACT IN AERS: 1.0000 | INHALATION_SPRAY | Freq: Four times a day (QID) | RESPIRATORY_TRACT | Status: DC

## 2019-03-31 MED ORDER — INSULIN ASPART 100 UNIT/ML ~~LOC~~ SOLN
0.0000 [IU] | Freq: Three times a day (TID) | SUBCUTANEOUS | Status: DC
  Administered 2019-04-02: 3 [IU] via SUBCUTANEOUS
  Administered 2019-04-02 – 2019-04-04 (×4): 2 [IU] via SUBCUTANEOUS

## 2019-03-31 MED ORDER — VITAMIN B-12 1000 MCG PO TABS
1000.0000 ug | ORAL_TABLET | Freq: Every day | ORAL | Status: DC
  Administered 2019-03-31 – 2019-04-05 (×6): 1000 ug via ORAL
  Filled 2019-03-31 (×6): qty 1

## 2019-03-31 MED ORDER — ATORVASTATIN CALCIUM 10 MG PO TABS
20.0000 mg | ORAL_TABLET | Freq: Every day | ORAL | Status: DC
  Administered 2019-03-31 – 2019-04-05 (×7): 20 mg via ORAL
  Filled 2019-03-31 (×6): qty 2

## 2019-03-31 MED ORDER — SODIUM CHLORIDE 0.9% FLUSH
10.0000 mL | INTRAVENOUS | Status: DC | PRN
  Administered 2019-04-02: 10 mL
  Filled 2019-03-31: qty 40

## 2019-03-31 MED ORDER — ZINC SULFATE 220 (50 ZN) MG PO CAPS
220.0000 mg | ORAL_CAPSULE | Freq: Every day | ORAL | Status: DC
  Administered 2019-03-31 – 2019-04-05 (×6): 220 mg via ORAL
  Filled 2019-03-31 (×6): qty 1

## 2019-03-31 MED ORDER — WARFARIN - PHARMACIST DOSING INPATIENT: Freq: Every day | Status: DC

## 2019-03-31 MED ORDER — ASPIRIN EC 81 MG PO TBEC
81.0000 mg | DELAYED_RELEASE_TABLET | Freq: Every day | ORAL | Status: DC
  Administered 2019-04-01 – 2019-04-05 (×5): 81 mg via ORAL
  Filled 2019-03-31 (×6): qty 1

## 2019-03-31 MED ORDER — SODIUM CHLORIDE 0.9% FLUSH
10.0000 mL | Freq: Two times a day (BID) | INTRAVENOUS | Status: DC
  Administered 2019-04-01 – 2019-04-05 (×8): 10 mL

## 2019-03-31 MED ORDER — HYDROXYZINE HCL 25 MG PO TABS
25.0000 mg | ORAL_TABLET | Freq: Three times a day (TID) | ORAL | Status: DC | PRN
  Administered 2019-04-01 – 2019-04-04 (×5): 25 mg via ORAL
  Filled 2019-03-31 (×5): qty 1

## 2019-03-31 MED ORDER — PANTOPRAZOLE SODIUM 40 MG PO TBEC
40.0000 mg | DELAYED_RELEASE_TABLET | Freq: Every day | ORAL | Status: DC
  Administered 2019-03-31 – 2019-04-05 (×6): 40 mg via ORAL
  Filled 2019-03-31 (×6): qty 1

## 2019-03-31 MED ORDER — ATORVASTATIN CALCIUM 10 MG PO TABS: 20.0000 mg | ORAL_TABLET | Freq: Every day | ORAL | Status: DC

## 2019-03-31 MED ORDER — IPRATROPIUM-ALBUTEROL 0.5-2.5 (3) MG/3ML IN SOLN
3.0000 mL | Freq: Four times a day (QID) | RESPIRATORY_TRACT | Status: DC
  Administered 2019-03-31 – 2019-04-01 (×2): 3 mL via RESPIRATORY_TRACT
  Filled 2019-03-31 (×3): qty 3

## 2019-03-31 MED ORDER — POLYETHYLENE GLYCOL 3350 17 G PO PACK: 17.0000 g | PACK | Freq: Every day | ORAL | Status: DC | PRN

## 2019-03-31 MED ORDER — DOCUSATE SODIUM 100 MG PO CAPS: 200.0000 mg | ORAL_CAPSULE | Freq: Every day | ORAL | Status: DC | PRN

## 2019-03-31 MED ORDER — ACETAMINOPHEN 325 MG PO TABS
650.0000 mg | ORAL_TABLET | ORAL | Status: DC | PRN
  Administered 2019-04-03 (×2): 650 mg via ORAL
  Filled 2019-03-31 (×2): qty 2

## 2019-03-31 MED ORDER — ASPIRIN 81 MG PO CHEW: 81.0000 mg | CHEWABLE_TABLET | Freq: Every day | ORAL | Status: DC

## 2019-03-31 MED ORDER — NYSTATIN 100000 UNIT/GM EX POWD
Freq: Every day | CUTANEOUS | Status: DC | PRN
  Filled 2019-03-31: qty 15

## 2019-03-31 MED ORDER — SPIRONOLACTONE 25 MG PO TABS
25.0000 mg | ORAL_TABLET | Freq: Every day | ORAL | Status: DC
  Administered 2019-03-31 – 2019-04-05 (×6): 25 mg via ORAL
  Filled 2019-03-31 (×6): qty 1

## 2019-03-31 MED ORDER — VANCOMYCIN HCL IN DEXTROSE 750-5 MG/150ML-% IV SOLN
750.0000 mg | Freq: Three times a day (TID) | INTRAVENOUS | Status: DC
  Administered 2019-03-31: 750 mg via INTRAVENOUS
  Filled 2019-03-31 (×2): qty 150

## 2019-03-31 MED ORDER — GABAPENTIN 300 MG PO CAPS
600.0000 mg | ORAL_CAPSULE | Freq: Three times a day (TID) | ORAL | Status: DC
  Administered 2019-03-31 – 2019-04-05 (×16): 600 mg via ORAL
  Filled 2019-03-31 (×16): qty 2

## 2019-03-31 MED ORDER — INSULIN ASPART 100 UNIT/ML ~~LOC~~ SOLN: 0.0000 [IU] | Freq: Every day | SUBCUTANEOUS | Status: DC

## 2019-03-31 MED ORDER — TRAMADOL HCL 50 MG PO TABS
50.0000 mg | ORAL_TABLET | ORAL | Status: DC | PRN
  Administered 2019-04-02 – 2019-04-03 (×3): 50 mg via ORAL
  Filled 2019-03-31 (×3): qty 1

## 2019-03-31 MED ORDER — HEPARIN (PORCINE) 25000 UT/250ML-% IV SOLN: 1400.0000 [IU]/h | INTRAVENOUS | Status: DC

## 2019-03-31 NOTE — Addendum Note (Signed)
Encounter addended by: Shea Stakes, RN on: 03/31/2019 9:38 AM  Actions taken: Visit diagnoses modified, Order list changed, Diagnosis association updated

## 2019-03-31 NOTE — Progress Notes (Addendum)
Patient presents for one week f/u with wound check per Dr. Donata Clay in VAD Clinic today by himself due to Covid-19 restrictions. Reports no problems with VAD equipment but does have concerns over drive line site.    Pt had increased drainage from site Monday evening with some "chest pain" - reviewed with Dr. Maren Beach. [name] was instructed to   Pack open area with silver strip and come to scheduled clinic appt today. Since then, pt has developed increased LUQ/left lower chest tenderness. CBC, CMP, and blood cultures x 2 sites added to today's INR. Will send patient for CT chest/abd/pelvis per Dr. Maren Beach.  CBC results returned with WBC 20 - will admit patient per PVT. Dr. Shirlee Latch updated, Morrie Sheldon, NP placing admission orders.   Vital Signs:  Doppler Pressure:  70 Automatc BP:  90/72 (74) HR: 87 SPO2:100 % RA  Exit Site Care: Drive line is being maintained daily by [name]. Dr. Donata Clay performed dressing change today.   Existing dressing removed using sterile technique. Site with more drainage than usual; wound culture obtained per Dr. Maren Beach. Site cleansed with betadine swab x 2. tunneling 1 cm on medial and lateral side of DL exit site; packed with silver strips; dry gauze dressing and occlusive tape applied.      Pt transported to 2C12 via w/c. Report given to bedside nurse. VAD cart placed in room.   Hessie Diener RN VAD Coordinator  Office: [phone]  24/7 Pager: [phone]

## 2019-03-31 NOTE — Addendum Note (Signed)
Encounter addended by: Levonne Spiller, RN on: 03/31/2019 2:25 PM  Actions taken: Vitals modified

## 2019-03-31 NOTE — Progress Notes (Signed)
Pharmacy Anticoagulation Note  Theodore Welch is a 58 y.o. male admitted on 03/31/19 with LVAD driveline infection. Pharmacy has been consulted for heparin dosing.  Warfarin currently on hold; INR this evening was 2.1. With possible needfor I&D, will continue to hold warfarin and start heparin when INR drops to <1.8. Recheck INR with AM labs.  Plan: Continue to hold warfarin and recheck INR with AM labs Start heparin at 1400 units/hr when INR<1.8  Thank you for allowing pharmacy to be a part of this patient's care.  Vicki Mallet, PharmD, BCPS, [facility] Clinical Pharmacist 03/31/2019 8:47 PM

## 2019-03-31 NOTE — Addendum Note (Signed)
Encounter addended by: Levonne Spiller, RN on: 03/31/2019 9:09 AM  Actions taken: Visit diagnoses modified, Order list changed, Diagnosis association updated

## 2019-03-31 NOTE — H&P (Addendum)
Advanced Heart Failure VAD History and Physical Note   PCP-Cardiologist: Dr Shirlee Latch  Reason for Admission: Driveline Infection  HPI:    58 y.o. with history of nonischemic cardiomyopathy, RLE DVT, cirrhosis, smoking, and OSA returns for followup of CHF/LVAD placement.  Cardiomyopathy was diagnosed in 6/19 in [place], [place] at that time showed low output.  He was admitted to [facility] in 9/19 with low output HF and was started on milrinone and diuresed.  Unable to wean off milrinone.  He had a degree of RV failure, but this improved on milrinone.  Valvular heart disease also looked better with milrinone and diuresis.  On 08/03/18, he had Heartmate 3 LVAD placed.  Speed was optimized by ramp echo post-op.  Post-op course was relatively unremarkable.  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     Last seen in VAD clinic 4/29. He was doing well. He had completed course of doxy for driveline infection on 4/22.  His caregiver contacted VAD coordinator yesterday with increased drainage from driveline. Pt denied trauma, fever, and chills. He did note some "pain up in chest area". Dr Maren Beach advised for him to continue daily dressing changes and to pack silver strips under opening. He was scheduled for appointment today in VAD clinic for dressing change.   He is being admitted today for driveline infection. He developed LUQ/left lower chest tenderness with increased driveline site drainage on Saturday.  Labs revealed WBC 19.9, Hgb 11.5, INR 1.9. BMET pending. CT abd/chest/pelvis with contrast is pending. Blood and wound cultures have been sent. He will be started on Vanc/Zosyn. Once WBC improved, will plan to place PICC line.   LVAD INTERROGATION:  HeartMate II LVAD:  Flow 5.3 liters/min, speed 5700, power 4.4, PI 2.3.  Multiple PI events.     Review of systems complete and found to be negative unless listed in HPI.   Home Medications Prior to Admission medications   Medication Sig  Start Date End Date Taking? Authorizing Provider  amLODipine (NORVASC) 5 MG tablet Take 2 tablets (10 mg total) by mouth daily. 01/13/19   Laurey Morale, MD  aspirin EC 81 MG tablet Take 81 mg by mouth daily.    [provider]  atorvastatin (LIPITOR) 20 MG tablet Take 1 tablet (20 mg total) by mouth daily. 12/10/18   Clegg, Amy D, NP  carbamide peroxide (DEBROX) 6.5 % OTIC solution Place 5 drops into both ears 3 (three) times daily. 12/10/18   Clegg, Amy D, NP  cyanocobalamin 500 MCG tablet Take 1,000 mcg by mouth daily.     [provider]  docusate sodium (COLACE) 100 MG capsule Take 2 capsules (200 mg total) by mouth daily as needed for mild constipation. 08/12/18   Clegg, Amy D, NP  doxycycline (VIBRAMYCIN) 50 MG capsule Take 2 capsules (100 mg total) by mouth daily. Patient not taking: Reported on 03/17/2019 02/17/19   Laurey Morale, MD  DULoxetine (CYMBALTA) 60 MG capsule Take 60 mg by mouth daily.    [provider]  fluticasone (FLONASE) 50 MCG/ACT nasal spray Place 1 spray into both nostrils 2 (two) times daily as needed for allergies.     [provider]  gabapentin (NEURONTIN) 300 MG capsule Take 600 mg by mouth 3 (three) times daily.    [provider]  glucose blood (CONTOUR NEXT TEST) test strip Use as instructed 12/18/18   Laurey Morale, MD  hydrOXYzine (VISTARIL) 25 MG capsule Take 25 mg by mouth 3 (  three) times daily as needed for anxiety.    [provider]  Ipratropium-Albuterol (COMBIVENT) 20-100 MCG/ACT AERS respimat Inhale 1 puff into the lungs every 6 (six) hours.    [provider]  Lancets (FREESTYLE) lancets Use as instructed 12/18/18   Laurey Morale, MD  metFORMIN (GLUCOPHAGE-XR) 500 MG 24 hr tablet Take 500 mg by mouth 2 (two) times daily.    [provider]  methocarbamol (ROBAXIN) 750 MG tablet Take 750 mg by mouth 3 (three) times daily as needed for muscle spasms.  02/21/14   [provider]  nicotine (NICODERM CQ - DOSED IN MG/24 HR) 7 mg/24hr patch Place 1 patch (7 mg total) onto the skin daily as needed (tobacco craving). 08/12/18   Clegg, Amy D, NP  nystatin (MYCOSTATIN/NYSTOP) powder Use with VAD dressing changes daily, advance as directed. 10/28/18   Clegg, Amy D, NP  omeprazole (PRILOSEC) 20 MG capsule Take 20 mg by mouth daily.    [provider]  oxyCODONE (OXY IR/ROXICODONE) 5 MG immediate release tablet Take 1 - 2 tabs q 4 - 6 hrs prn pain 12/15/18   Donata Clay, Theron Arista, MD  polyethylene glycol [facility] / Ethelene Hal) packet Take 17 g by mouth daily as needed for mild constipation.    [provider]  sacubitril-valsartan (ENTRESTO) 49-51 MG Take 1 tablet by mouth 2 (two) times daily. 12/30/18   Laurey Morale, MD  sildenafil (VIAGRA) 100 MG tablet Take 50 mg by mouth daily as needed for erectile dysfunction.    [provider]  spironolactone (ALDACTONE) 25 MG tablet Take 1 tablet (25 mg total) by mouth daily. 10/14/18   Laurey Morale, MD  thiamine 100 MG tablet Take 100 mg by mouth daily.    [provider]  traMADol (ULTRAM) 50 MG tablet Take 1 tablet (50 mg total) by mouth every 4 (four) hours as needed for moderate pain. 08/12/18   Clegg, Amy D, NP  warfarin (COUMADIN) 5 MG tablet Take  1 and 1/2 tablets (7.5 mg) Tue and Sa and 1 tab = 5mg  AOD Or as directed by AHF clinic    [provider]  Zinc Gluconate 100 MG TABS Take 1 tablet (100 mg total) by mouth daily. 12/15/18   Kerin Perna, MD    Past Medical History: Past Medical History:  Diagnosis Date  . Cardiomyopathy, unspecified (HCC)   . CHF (congestive heart failure) (HCC)   . Chronic back pain   . Enlarged heart   . Gastric ulcer   . Gastroenteritis   . H/O degenerative disc disease     Past Surgical History: Past Surgical History:  Procedure Laterality Date  . APPLICATION OF WOUND VAC N/A 12/03/2018   Procedure: APPLICATION OF WOUND VAC;   Surgeon: Donata Clay, Theron Arista, MD;  Location: [facility] OR;  Service: Thoracic;  Laterality: N/A;  . APPLICATION OF WOUND VAC N/A 12/09/2018   Procedure: Removal of Wound Vac, Application of ACELL;  Surgeon: Kerin Perna, MD;  Location: [facility] OR;  Service: Thoracic;  Laterality: N/A;  . INSERTION OF IMPLANTABLE LEFT VENTRICULAR ASSIST DEVICE N/A 08/03/2018   Procedure: INSERTION OF IMPLANTABLE LEFT VENTRICULAR ASSIST DEVICE - HM3;  Surgeon: Alleen Borne, MD;  Location: [facility] OR;  Service: Open Heart Surgery;  Laterality: N/A;  HM3  . LUMBAR LAMINECTOMY/DECOMPRESSION MICRODISCECTOMY    . ORCHIECTOMY    . RIGHT HEART CATH N/A 07/24/2018   Procedure: RIGHT HEART CATH;  Surgeon: Laurey Morale, MD;  Location: [facility] INVASIVE CV LAB;  Service: Cardiovascular;  Laterality: N/A;  . STERNAL WOUND DEBRIDEMENT N/A 12/03/2018   Procedure: WOUND DEBRIDEMENT WITH A-CELL;  Surgeon: Kerin Perna, MD;  Location: [facility] OR;  Service: Thoracic;  Laterality: N/A;  . TEE WITHOUT CARDIOVERSION N/A 08/03/2018   Procedure: TRANSESOPHAGEAL ECHOCARDIOGRAM (TEE);  Surgeon: Alleen Borne, MD;  Location: [facility] OR;  Service: Open Heart Surgery;  Laterality: N/A;    Family History: Denies known family history of cardiac problems.  Social History: Social History   Socioeconomic History  . Marital status: Single    Spouse name: Not on file  . Number of children: 7  . Years of education: 9th  . Highest education level: 9th grade  Occupational History  . Occupation: disabled  Social Needs  . Financial resource strain: Hard  . Food insecurity:    Worry: Sometimes true    Inability: Sometimes true  . Transportation needs:    Medical: No    Non-medical: No  Tobacco Use  . Smoking status: Former Smoker    Types: Cigarettes    Start date: 2003  . Smokeless tobacco: Never Used  . Tobacco comment: Nicoderm patch   Substance and Sexual Activity  . Alcohol use: Not Currently  . Drug use: Not Currently  . Sexual activity: Not on  file  Lifestyle  . Physical activity:    Days per week: Not on file    Minutes per session: Not on file  . Stress: Not on file  Relationships  . Social connections:    Talks on phone: Not on file    Gets together: Not on file    Attends religious service: Not on file    Active member of club or organization: Not on file    Attends meetings of clubs or organizations: Not on file    Relationship status: Not on file  Other Topics Concern  . Not on file  Social History Narrative  . Not on file    Allergies:  No Known Allergies  Objective:    Vital Signs:   BP: ()/()  Arterial Line BP: ()/()    There were no vitals filed for this visit.  Mean arterial Pressure 80  Physical Exam    General:  Well appearing. No resp difficulty HEENT: Normal Neck: supple. JVP not elevated. Carotids 2+ bilat; no bruits. No lymphadenopathy or thyromegaly appreciated. Cor: Mechanical heart sounds with LVAD hum present. Lungs: Clear Abdomen: soft, nondistended. No hepatosplenomegaly. No bruits or masses. Good bowel sounds.  There is tenderness left upper quadrant/left lower chest.  Driveline: Increased drainage and driveline exit site; securement device intact and driveline incorporated Extremities: no cyanosis, clubbing, rash, edema Neuro: alert & orientedx3, cranial nerves grossly intact. moves all 4 extremities w/o difficulty. Affect pleasant   Telemetry   NSR with PVCs in 80s (personally reviewed).   EKG   Pending  Labs    Basic Metabolic Panel: No results for input(s): NA, K, CL, CO2, GLUCOSE, BUN, CREATININE, CALCIUM, MG, PHOS in the last 168 hours.  Liver Function Tests: No results for input(s): AST, ALT, ALKPHOS, BILITOT, PROT, ALBUMIN in the last 168 hours. No results for input(s): LIPASE, AMYLASE in the last 168 hours. No results for input(s): AMMONIA in the last 168 hours.  CBC: Recent Labs  Lab 03/31/19 0832  WBC 19.9*  HGB 11.5*  HCT 39.2  MCV 81.5  PLT 232     Cardiac Enzymes: No results for input(s): CKTOTAL, CKMB, CKMBINDEX, TROPONINI  in the last 168 hours.  BNP: BNP (last 3 results) Recent Labs    07/21/18 0540 08/04/18 0902 08/10/18 0026  BNP 1,343.9* 658.6* 537.0*    ProBNP (last 3 results) No results for input(s): PROBNP in the last 8760 hours.   CBG: No results for input(s): GLUCAP in the last 168 hours.  Coagulation Studies: Recent Labs    03/31/19 0819  LABPROT 21.7*  INR 1.9*     Imaging    No results found.    Patient Profile:   58 y.o. with history of nonischemic cardiomyopathy, RLE DVT, cirrhosis, smoking, and OSA returns for followup of CHF/LVAD placement.  Cardiomyopathy was diagnosed in 6/19 in [place], [place] at that time showed low output.  He was admitted to [facility] in 9/19 with low output HF and was started on milrinone and diuresed.  Unable to wean off milrinone.  He had a degree of RV failure, but this improved on milrinone.  Valvular heart disease also looked better with milrinone and diuresis.  On 08/03/18, he had Heartmate 3 LVAD placed.  Speed was optimized by ramp echo post-op.  Post-op course was relatively unremarkable.  Admitted from VAD clinic 5/13 with driveline infection.  Assessment/Plan:    1. Driveline infection - Completed course of doxy 4/22 for driveline infection - WBC 19.9 today. CT chest/abdomen/pelvis with contrast pending. Dr Donata Clay aware and following. Will follow up and see if he will need I&D. - Blood cx and wound cx pending - Start Vanc and Zosyn per pharmacy - Hold antihypertensives for now - Plan to place PICC line once WBC improved.  2. 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  LVAD parameters stable except for frequent PI events (no low flows).  This is chronic for him.  MAP 80. He is not volume overloaded. - He does not need Lasix.      - Hold Entresto and amlodipine for now with infection/concern for sepsis. Can continue  spironolactone for now.  - Continue ASA 81 daily.  - He has been on warfarin with goal INR 2-2.5. Hgb stable 11.5. INR 1.9. Pharmacy consulted for warfarin management while admitted. May need to hold tonight if CT looks like he will need I&D. Will f/u with Dr Donata Clay. - Should be transplant candidate eventually if he can stay off cigarettes, he has almost quit. 3. Smoking:  Knows he needs to quit to be a transplant candidate and working on it. Continue nicotine patches while inpatient 4. RLE DVT: On warfarin for LVAD.  5. OSA: Continue CPAP.  5. Hyperlipidemia: Continue atorvastatin.  6. Type II diabetes: SSI while inpatient.    I reviewed the LVAD parameters from today, and compared the results to the patient's prior recorded data.  No programming changes were made.  The LVAD is functioning within specified parameters.  The patient performs LVAD self-test daily.  LVAD interrogation was negative for any significant power changes, alarms or PI events/speed drops.  LVAD equipment check completed and is in good working order.  Back-up equipment present.   LVAD education done on emergency procedures and precautions and reviewed exit site care.  Length of Stay: 0  Theodore Highland, NP 03/31/2019, 10:34 AM  VAD Team Pager [phone] (7am - 7am) +++VAD ISSUES ONLY+++   Advanced Heart Failure Team Pager [phone] (M-F; 7a - 4p)  Please contact [facility] for night-coverage after hours (4p -7a ) and weekends on [email or web address] for all non- LVAD Issues  Patient  seen with NP, agree with the above note.   He developed LUQ/left lower chest tenderness/pain as well as increased driveline drainage on Saturday.  Has history of driveline infections.  He is afebrile but WBCs elevated at 19.  Has not felt good, fatigued.    MAP 80.  He has had frequent PI events but this is chronic for him.   On exam, no JVD.  Normal LVAD sounds.  Clear lungs.  Tenderness LUQ/left lower chest.  Drainage at driveline site.      Suspect driveline site infection.  He will need CT chest/abd/pelvis to assess for abscess. I will start him on vancomycin/Zosyn.  Had prior infection with MSSA in 1/20.  He may need debridement, will hold warfarin for now and start heparin gtt when INR < 1.8. Blood and wound cultures obtained.   MAP 80.  I will hold Entresto and amlodipine for now in case his MAP drops with early sepsis physiology.  Will continue spironolactone for now.    Theodore Welch 03/31/2019 2:04 PM

## 2019-03-31 NOTE — Addendum Note (Signed)
Encounter addended by: Levonne Spiller, RN on: 03/31/2019 2:44 PM  Actions taken: Vitals modified, Order Reconciliation Section accessed, Home Medications modified, Medication List reviewed, Medication taking status modified, Clinical Note Signed

## 2019-03-31 NOTE — Progress Notes (Addendum)
Pharmacy Antibiotic Note  Theodore Welch is a 58 y.o. male admitted on 03/31/19 with driveline infection. Pharmacy has been consulted for vancomycin and zosyn dosing.  No fevers noted, wbc 19 today on clinic check.  Vancomycin  750mg  IV Q 8 hrs. Goal AUC 400-550. Expected AUC: 485 SCr used: 1.1   INR was just below goal at 1.9, with possible need of I&D will hold warfarin and start heparin when INR drops to <1.8. Will recheck INR tonight.    Plan: Vancomycin 2000mg  now then 750mg  IV every 8 hours.  Zosyn 3.375g IV q8 hours Start heparin at 1400 units/hr when INR<1.8 Check INR again tonight   No data recorded.  Recent Labs  Lab 03/31/19 0832  WBC 19.9*    CrCl cannot be calculated (Unknown ideal weight.).    No Known Allergies  Thank you for allowing pharmacy to be a part of this patient's care.  Sheppard Coil PharmD., BCPS Clinical Pharmacist 03/31/2019 10:42 AM

## 2019-03-31 NOTE — Addendum Note (Signed)
Encounter addended by: Theresia Bough, CMA on: 03/31/2019 9:09 AM  Actions taken: Order list changed, Diagnosis association updated

## 2019-04-01 DIAGNOSIS — B9561 Methicillin susceptible Staphylococcus aureus infection as the cause of diseases classified elsewhere: Secondary | ICD-10-CM

## 2019-04-01 DIAGNOSIS — Z95811 Presence of heart assist device: Secondary | ICD-10-CM

## 2019-04-01 DIAGNOSIS — T827XXA Infection and inflammatory reaction due to other cardiac and vascular devices, implants and grafts, initial encounter: Principal | ICD-10-CM

## 2019-04-01 DIAGNOSIS — Z87891 Personal history of nicotine dependence: Secondary | ICD-10-CM

## 2019-04-01 DIAGNOSIS — R7881 Bacteremia: Secondary | ICD-10-CM

## 2019-04-01 DIAGNOSIS — R1013 Epigastric pain: Secondary | ICD-10-CM

## 2019-04-01 LAB — GLUCOSE, CAPILLARY
Glucose-Capillary: 119 mg/dL — ABNORMAL HIGH (ref 70–99)
Glucose-Capillary: 97 mg/dL (ref 70–99)
Glucose-Capillary: 73 mg/dL (ref 70–99)
Glucose-Capillary: 196 mg/dL — ABNORMAL HIGH (ref 70–99)

## 2019-04-01 LAB — CBC WITH DIFFERENTIAL/PLATELET
Abs Immature Granulocytes: 0.06 10*3/uL (ref 0.00–0.07)
Basophils Absolute: 0.1 10*3/uL (ref 0.0–0.1)
Basophils Relative: 1 %
Eosinophils Absolute: 0.1 10*3/uL (ref 0.0–0.5)
Eosinophils Relative: 1 %
HCT: 30.6 % — ABNORMAL LOW (ref 39.0–52.0)
Hemoglobin: 9.5 g/dL — ABNORMAL LOW (ref 13.0–17.0)
Immature Granulocytes: 0 %
Lymphocytes Relative: 9 %
Lymphs Abs: 1.4 10*3/uL (ref 0.7–4.0)
MCH: 24 pg — ABNORMAL LOW (ref 26.0–34.0)
MCHC: 31 g/dL (ref 30.0–36.0)
MCV: 77.3 fL — ABNORMAL LOW (ref 80.0–100.0)
Monocytes Absolute: 2.9 10*3/uL — ABNORMAL HIGH (ref 0.1–1.0)
Monocytes Relative: 19 %
Neutro Abs: 11 10*3/uL — ABNORMAL HIGH (ref 1.7–7.7)
Neutrophils Relative %: 70 %
Platelets: 196 10*3/uL (ref 150–400)
RBC: 3.96 MIL/uL — ABNORMAL LOW (ref 4.22–5.81)
RDW: 16.2 % — ABNORMAL HIGH (ref 11.5–15.5)
WBC: 15.5 10*3/uL — ABNORMAL HIGH (ref 4.0–10.5)
nRBC: 0 % (ref 0.0–0.2)

## 2019-04-01 LAB — BASIC METABOLIC PANEL
Anion gap: 11 (ref 5–15)
BUN: 10 mg/dL (ref 6–20)
CO2: 26 mmol/L (ref 22–32)
Calcium: 8.9 mg/dL (ref 8.9–10.3)
Chloride: 99 mmol/L (ref 98–111)
Creatinine, Ser: 1.11 mg/dL (ref 0.61–1.24)
GFR calc Af Amer: >60 mL/min (ref >60)
GFR calc non Af Amer: >60 mL/min (ref >60)
Glucose, Bld: 114 mg/dL — ABNORMAL HIGH (ref 70–99)
Potassium: 3.4 mmol/L — ABNORMAL LOW (ref 3.5–5.1)
Sodium: 136 mmol/L (ref 135–145)

## 2019-04-01 LAB — LACTATE DEHYDROGENASE: LDH: 129 U/L (ref 98–192)

## 2019-04-01 LAB — PROTIME-INR
INR: 2.2 — ABNORMAL HIGH (ref 0.8–1.2)
Prothrombin Time: 23.8 seconds — ABNORMAL HIGH (ref 11.4–15.2)

## 2019-04-01 MED ORDER — WARFARIN SODIUM 5 MG PO TABS
5.0000 mg | ORAL_TABLET | Freq: Once | ORAL | Status: AC
  Administered 2019-04-01: 5 mg via ORAL
  Filled 2019-04-01: qty 1

## 2019-04-01 MED ORDER — IPRATROPIUM-ALBUTEROL 0.5-2.5 (3) MG/3ML IN SOLN: 3.0000 mL | Freq: Four times a day (QID) | RESPIRATORY_TRACT | Status: DC | PRN

## 2019-04-01 MED ORDER — SODIUM CHLORIDE 0.9 % IV SOLN
INTRAVENOUS | Status: DC | PRN
  Administered 2019-04-01: 250 mL via INTRAVENOUS

## 2019-04-01 MED ORDER — WARFARIN - PHARMACIST DOSING INPATIENT
Freq: Every day | Status: DC
  Administered 2019-04-01 – 2019-04-02 (×2): 1
  Administered 2019-04-03 – 2019-04-04 (×2)

## 2019-04-01 MED ORDER — CEFAZOLIN SODIUM-DEXTROSE 2-4 GM/100ML-% IV SOLN
2.0000 g | Freq: Three times a day (TID) | INTRAVENOUS | Status: DC
  Administered 2019-04-01 – 2019-04-05 (×14): 2 g via INTRAVENOUS
  Filled 2019-04-01 (×16): qty 100

## 2019-04-01 NOTE — Consult Note (Signed)
[facility]    Date of Admission:  03/31/2019     Total days of antibiotics 1  Day 1 cefazolin   (previously vancomycin/zosyn)             Reason for Consult: MSSA Bacteremia     Referring Provider: [employer]  Primary Care Provider: [name], MD   Assessment: Theodore Welch is a 58 y.o. 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 He has MSSA growing in 1/4 bottles of blood as well as gram positive cocci on gram stain from superficial wound culture taken yesterday. He was started on vancomycin and zosyn now narrowed to cefazolin alone given micro data. His CT scan does not reveal any drainable abscess around the driveline site or previously infected counter incision (which seems to be healed up nicely) and no new suspicions of any enhancing fluid collections around intrathoracic hardware, no gas/sius tract formation along any component including at the site of his tenderness. I am not certain exactly what is causing his pain.   For his staphylococcus aureus bacteremia will continue cefazolin 2g IV TID. Checking a TEE for him would be recommended if he was a surgical candidate for pump exchange should it return positive - otherwise would not necessarily change his medical treatment but may be of help if would like to know more about risk for embolic event. Repeat blood cultures ordered to prove clearance.   His midline will need to be removed and replaced with PICC line as this was placed prior to documenting clearance of bacteremia and will need more durable line for duration of therapy.   He is feeling better and was disappointed to know that he will indeed need a PICC line for treatment again. I also informed him that given his history with infections and now bacteremic would also recommend continuation of oral suppressive therapy after IV induction.   Plan: 1. Continue Cefazolin alone.  6 weeks therapy recommended after clearance with oral suppressive therapy thereafter.  2. Repeat blood cultures 3. Needs Midline exchanged for PICC when blood clear 4. Will defer TEE to VAD team    Principal Problem:   MSSA bacteremia Active Problems:   Infection associated with driveline of left ventricular assist device (LVAD) (HCC)   . aspirin EC  81 mg Oral Daily  . atorvastatin  20 mg Oral q1800  . carbamide peroxide  5 drop Both EARS TID  . DULoxetine  60 mg Oral Daily  . gabapentin  600 mg Oral TID  . insulin aspart  0-15 Units Subcutaneous TID WC  . insulin aspart  0-5 Units Subcutaneous QHS  . pantoprazole  40 mg Oral Daily  . sodium chloride flush  10-40 mL Intracatheter Q12H  . spironolactone  25 mg Oral Daily  . thiamine  100 mg Oral Daily  . vitamin B-12  1,000 mcg Oral Daily  . zinc sulfate  220 mg Oral Daily    HPI: Theodore Welch is a 58 y.o. male admitted with worsening drainage from LVAD exit site. He underwent Heartmate 3 LVAD placement for advanced heart failure 08/03/18. Unfortunately in early January 2020 he required admission for increased drainage and cellulitis with abscess to his counter incision medial to driveline exit site. Wound cultures from surgical debridement at that time revealed MSSA; he was not bacteremic at that time. He was treated with IV cefazolin x 4 weeks after  surgical debridement and wound vac placement 1/16. He followed up with Dr. Orvan Falconerampbell in ID clinic and closely with VAD team monitoring the progress of his wound. He last saw Dr. Orvan Falconerampbell March 12th where he was off antibiotics about a month as his wound was healing up nicely and repeated superficial cultures x 2 were no growth of any organisms.   Early April he began experiencing drainage at the exit site - he was switched back to daily dressings with Aquacel silver strips and wet-to-dry gauze and treated with doxycycline x 21 days total with last dose 4/22.   He was in usual state of  health up until Saturday May 9th when he woke up "just not feeling well" and skipped breakfast and experienced some low back pain as well as substernal/epigastric pains that were reproducible with palpation and cough. They did his daily driveline dressing change and noted increased purulent drainage from the site and his wife, Theodore Welch called the VAD coordinator to send them a photo. He had no fevers/chills. He wears his driveline anchor device regularly. He recalls no trauma to the site but did pick up his young grand child recently and hopes this had nothing to do with current infection. His back pain has resolved since Saturday on it's own and substernal/epigastric pain persists with same quality.   Infectious work up thus far reveals leukocytosis and bacteremia with MSSA in 1/4 bottles. CT of the C/A/P w/ contrast reveals no mediastinal lymphadenopathy, no axillary lymphadenopathy, normal lungs/GI system, soft tissue attenuation in subcutaneous fat around the drive line w/o focal or rim enhancing fluid collection; improvement in previous stranding/edema at the adjacent incision that was previously infected.   Review of Systems: Review of Systems  Constitutional: Positive for malaise/fatigue. Negative for chills and fever.  Eyes: Negative for blurred vision and photophobia.  Respiratory: Positive for cough. Negative for sputum production and shortness of breath.   Cardiovascular: Negative for chest pain.  Gastrointestinal: Positive for abdominal pain. Negative for diarrhea, nausea and vomiting.  Genitourinary: Negative for dysuria.  Skin: Negative for rash.  Neurological: Negative for headaches.    Past Medical History:  Diagnosis Date  . Cardiomyopathy, unspecified (HCC)   . CHF (congestive heart failure) (HCC)   . Chronic back pain   . Enlarged heart   . Gastric ulcer   . Gastroenteritis   . H/O degenerative disc disease     Social History   Tobacco Use  . Smoking status: Former  Smoker    Types: Cigarettes    Start date: 2003  . Smokeless tobacco: Never Used  . Tobacco comment: Nicoderm patch   Substance Use Topics  . Alcohol use: Not Currently  . Drug use: Not Currently    History reviewed. No pertinent family history. No Known Allergies  OBJECTIVE: Blood pressure (!) 88/73, pulse 88, temperature 98.7 F (37.1 C), temperature source Oral, resp. rate 19, height 5\' 10"  (1.778 m), weight 80.1 kg, SpO2 100 %.  Physical Exam Constitutional:      Comments: Sitting up in recliner, pleasant and well appearing.   HENT:     Mouth/Throat:     Mouth: Mucous membranes are moist.     Pharynx: Oropharynx is clear.  Eyes:     General: No scleral icterus.    Pupils: Pupils are equal, round, and reactive to light.  Cardiovascular:     Rate and Rhythm: Normal rate and regular rhythm.     Comments: LVAD humm +, no native heart tones Pulmonary:  Effort: Pulmonary effort is normal. No respiratory distress.     Breath sounds: Normal breath sounds.  Abdominal:     General: There is distension.     Palpations: Abdomen is soft.     Tenderness: There is abdominal tenderness (epigastric/substernal point tenderness with palpation between xiphoid and chest tube scars. Scar tissue appreciated but otherwise soft w/o mass or fluctuance. No erythema. ).  Skin:    General: Skin is warm and dry.     Capillary Refill: Capillary refill takes less than 2 seconds.     Comments: L Midline in place   Neurological:     Mental Status: He is oriented to person, place, and time.       Lab Results Lab Results  Component Value Date   WBC 15.5 (H) 04/01/2019   HGB 9.5 (L) 04/01/2019   HCT 30.6 (L) 04/01/2019   MCV 77.3 (L) 04/01/2019   PLT 196 04/01/2019    Lab Results  Component Value Date   CREATININE 1.11 04/01/2019   BUN 10 04/01/2019   NA 136 04/01/2019   K 3.4 (L) 04/01/2019   CL 99 04/01/2019   CO2 26 04/01/2019    Lab Results  Component Value Date   ALT 14  03/31/2019   AST 19 03/31/2019   ALKPHOS 132 (H) 03/31/2019   BILITOT 1.2 03/31/2019     Microbiology: Recent Results (from the past 240 hour(s))  Culture, blood (Routine X 2) w Reflex to ID Panel     Status: None (Preliminary result)   Collection Time: 03/31/19  8:55 AM  Result Value Ref Range Status   Specimen Description BLOOD LEFT ANTECUBITAL  Final   Special Requests   Final    BOTTLES DRAWN AEROBIC AND ANAEROBIC Blood Culture results may not be optimal due to an inadequate volume of blood received in culture bottles   Culture  Setup Time   Final    ANAEROBIC BOTTLE ONLY GRAM POSITIVE COCCI CRITICAL RESULT CALLED TO, READ BACK BY AND VERIFIED WITH: V BRYK PHARMD 03/31/19 2344 JDW    Culture   Final    GRAM POSITIVE COCCI CULTURE REINCUBATED FOR BETTER GROWTH Performed at [facility], [address] [place], [place] [address]    Report Status PENDING  Incomplete  Blood Culture ID Panel (Reflexed)     Status: Abnormal   Collection Time: 03/31/19  8:55 AM  Result Value Ref Range Status   Enterococcus species NOT DETECTED NOT DETECTED Final   Listeria monocytogenes NOT DETECTED NOT DETECTED Final   Staphylococcus species DETECTED (A) NOT DETECTED Final    Comment: CRITICAL RESULT CALLED TO, READ BACK BY AND VERIFIED WITH: V BRYK PHARMD 03/31/19 2344 JDW    Staphylococcus aureus (BCID) DETECTED (A) NOT DETECTED Final    Comment: Methicillin (oxacillin) susceptible Staphylococcus aureus (MSSA). Preferred therapy is anti staphylococcal beta lactam antibiotic (Cefazolin or Nafcillin), unless clinically contraindicated. CRITICAL RESULT CALLED TO, READ BACK BY AND VERIFIED WITH: V BRYK PHARMD 03/31/19 2344 JDW    Methicillin resistance NOT DETECTED NOT DETECTED Final   Streptococcus species NOT DETECTED NOT DETECTED Final   Streptococcus agalactiae NOT DETECTED NOT DETECTED Final   Streptococcus pneumoniae NOT DETECTED NOT DETECTED Final   Streptococcus pyogenes NOT  DETECTED NOT DETECTED Final   Acinetobacter baumannii NOT DETECTED NOT DETECTED Final   Enterobacteriaceae species NOT DETECTED NOT DETECTED Final   Enterobacter cloacae complex NOT DETECTED NOT DETECTED Final   Escherichia coli NOT DETECTED NOT DETECTED Final  Klebsiella oxytoca NOT DETECTED NOT DETECTED Final   Klebsiella pneumoniae NOT DETECTED NOT DETECTED Final   Proteus species NOT DETECTED NOT DETECTED Final   Serratia marcescens NOT DETECTED NOT DETECTED Final   Haemophilus influenzae NOT DETECTED NOT DETECTED Final   Neisseria meningitidis NOT DETECTED NOT DETECTED Final   Pseudomonas aeruginosa NOT DETECTED NOT DETECTED Final   Candida albicans NOT DETECTED NOT DETECTED Final   Candida glabrata NOT DETECTED NOT DETECTED Final   Candida krusei NOT DETECTED NOT DETECTED Final   Candida parapsilosis NOT DETECTED NOT DETECTED Final   Candida tropicalis NOT DETECTED NOT DETECTED Final    Comment: Performed at [facility], [address] [place], [place] [address]  Culture, blood (Routine X 2) w Reflex to ID Panel     Status: None (Preliminary result)   Collection Time: 03/31/19  9:00 AM  Result Value Ref Range Status   Specimen Description BLOOD LEFT FOREARM  Final   Special Requests   Final    BOTTLES DRAWN AEROBIC AND ANAEROBIC Blood Culture adequate volume   Culture  Setup Time   Final    GRAM POSITIVE COCCI AEROBIC BOTTLE ONLY CRITICAL VALUE NOTED.  VALUE IS CONSISTENT WITH PREVIOUSLY REPORTED AND CALLED VALUE. Performed at [facility], [address] [place], [place] [address]    Culture PENDING  Incomplete   Report Status PENDING  Incomplete  Aerobic Culture (superficial specimen)     Status: None (Preliminary result)   Collection Time: 03/31/19 10:08 AM  Result Value Ref Range Status   Specimen Description WOUND LVAD DRIVELINE SITE  Final   Special Requests NONE  Final   Gram Stain   Final    RARE WBC PRESENT,BOTH PMN AND MONONUCLEAR RARE GRAM  POSITIVE COCCI Performed at [facility], [address] [place], [place] [address]    Culture PENDING  Incomplete   Report Status PENDING  Incomplete  MRSA PCR Screening     Status: None   Collection Time: 03/31/19 11:27 AM  Result Value Ref Range Status   MRSA by PCR NEGATIVE NEGATIVE Final    Comment:        The GeneXpert MRSA Assay (FDA approved for NASAL specimens only), is one component of a comprehensive MRSA colonization surveillance program. It is not intended to diagnose MRSA infection nor to guide or monitor treatment for MRSA infections. Performed at [facility], [address] [place], [place] [address]   SARS Coronavirus 2 (CEPHEID - Performed in [facility] hospital lab), Hosp Order     Status: None   Collection Time: 03/31/19  6:05 PM  Result Value Ref Range Status   SARS Coronavirus 2 NEGATIVE NEGATIVE Final    Comment: (NOTE) If result is NEGATIVE SARS-CoV-2 target nucleic acids are NOT DETECTED. The SARS-CoV-2 RNA is generally detectable in upper and lower  respiratory specimens during the acute phase of infection. The lowest  concentration of SARS-CoV-2 viral copies this assay can detect is 250  copies / mL. A negative result does not preclude SARS-CoV-2 infection  and should not be used as the sole basis for treatment or other  patient management decisions.  A negative result may occur with  improper specimen collection / handling, submission of specimen other  than nasopharyngeal swab, presence of viral mutation(s) within the  areas targeted by this assay, and inadequate number of viral copies  (<250 copies / mL). A negative result must be combined with clinical  observations, patient history, and epidemiological information. If result is POSITIVE SARS-CoV-2 target nucleic acids are DETECTED. The SARS-CoV-2 RNA is generally detectable in upper and lower  respiratory specimens dur ing the acute phase of infection.  Positive  results are  indicative of active infection with SARS-CoV-2.  Clinical  correlation with patient history and other diagnostic information is  necessary to determine patient infection status.  Positive results do  not rule out bacterial infection or co-infection with other viruses. If result is PRESUMPTIVE POSTIVE SARS-CoV-2 nucleic acids MAY BE PRESENT.   A presumptive positive result was obtained on the submitted specimen  and confirmed on repeat testing.  While 2019 novel coronavirus  (SARS-CoV-2) nucleic acids may be present in the submitted sample  additional confirmatory testing may be necessary for epidemiological  and / or clinical management purposes  to differentiate between  SARS-CoV-2 and other Sarbecovirus currently known to infect humans.  If clinically indicated additional testing with an alternate test  methodology [phone]) is advised. The SARS-CoV-2 RNA is generally  detectable in upper and lower respiratory sp ecimens during the acute  phase of infection. The expected result is Negative. Fact Sheet for Patients:  BoilerBrush.com.cy Fact Sheet for Healthcare Providers: https://pope.com/ This test is not yet approved or cleared by the [place] FDA and has been authorized for detection and/or diagnosis of SARS-CoV-2 by FDA under an Emergency Use Authorization (EUA).  This EUA will remain in effect (meaning this test can be used) for the duration of the COVID-19 declaration under Section 564(b)(1) of the Act, 21 U.S.C. section 360bbb-3(b)(1), unless the authorization is terminated or revoked sooner. Performed at [facility], [address], [place], [place] [address]     Rexene Alberts, MSN, NP-C [facility] Cell: [phone] Pager: [phone]  04/01/2019 9:52 AM

## 2019-04-01 NOTE — Plan of Care (Signed)
  Problem: Clinical Measurements: Goal: Will remain free from infection Outcome: Progressing Goal: Cardiovascular complication will be avoided Outcome: Progressing   Problem: Education: Goal: Patient will be able to verbalize current INR target range and antiplatelet therapy for discharge home Outcome: Progressing   Problem: Clinical Measurements: Goal: Ability to maintain clinical measurements within normal limits will improve Outcome: Progressing Goal: Will remain free from infection Outcome: Progressing Goal: Diagnostic test results will improve Outcome: Progressing Goal: Cardiovascular complication will be avoided Outcome: Progressing   Problem: Pain Managment: Goal: General experience of comfort will improve Outcome: Progressing   Problem: Safety: Goal: Ability to remain free from injury will improve Outcome: Progressing   Problem: Skin Integrity: Goal: Risk for impaired skin integrity will decrease Outcome: Progressing   

## 2019-04-01 NOTE — Progress Notes (Signed)
Patient ID: Theodore Welch, male   DOB: 07/05/1961, 58 y.o.   MRN: [id number]   Advanced Heart Failure VAD Team Note  PCP-Cardiologist: No primary care provider on file.   Subjective:    Less tenderness in LUQ this morning.  Tm 99.1, WBCs 15.5.  MSSA in blood cultures, now on cefazolin. Still significant driveline drainage.  MAP stable, he is off Entresto and amlodipine currently.   CT chest/abdomen/pelvis did not show abscess at driveline site.     LVAD INTERROGATION:  HeartMate 3 LVAD:   Flow 5.3 liters/min, speed 5700, power 4.6, PI 2.7.    Objective:    Vital Signs:   Temp:  [98.4 F (36.9 C)-99.7 F (37.6 C)] 98.4 F (36.9 C) (05/14 1135) Pulse Rate:  [88-89] 89 (05/14 1135) Resp:  [17-21] 21 (05/14 1135) BP: (81-121)/(26-73) 85/39 (05/14 1135) SpO2:  [97 %-100 %] 100 % (05/14 1135) Weight:  [80.1 kg] 80.1 kg (05/14 0649) Last BM Date: 03/30/19 Mean arterial Pressure 80  Intake/Output:   Intake/Output Summary (Last 24 hours) at 04/01/2019 1201 Last data filed at 04/01/2019 0933 Gross per 24 hour  Intake 1370 ml  Output 500 ml  Net 870 ml     Physical Exam    General:  Well appearing. No resp difficulty HEENT: normal Neck: supple. JVP not elevated. Carotids 2+ bilat; no bruits. No lymphadenopathy or thyromegaly appreciated. Cor: Mechanical heart sounds with LVAD hum present. Lungs: clear Abdomen: soft, nontender, nondistended. No hepatosplenomegaly. No bruits or masses. Good bowel sounds. Driveline: C/D/I; securement device intact and driveline incorporated Extremities: no cyanosis, clubbing, rash, edema Neuro: alert & orientedx3, cranial nerves grossly intact. moves all 4 extremities w/o difficulty. Affect pleasant   Telemetry   NSR with occasional PVCs (personally reviewed)  Labs   Basic Metabolic Panel: Recent Labs  Lab 03/31/19 1106 04/01/19 0354  NA 133* 136  K 3.6 3.4*  CL 97* 99  CO2 23 26  GLUCOSE 125* 114*  BUN 10 10  CREATININE 1.25*  1.11  CALCIUM 9.0 8.9    Liver Function Tests: Recent Labs  Lab 03/31/19 1106  AST 19  ALT 14  ALKPHOS 132*  BILITOT 1.2  PROT 7.3  ALBUMIN 3.1*   No results for input(s): LIPASE, AMYLASE in the last 168 hours. No results for input(s): AMMONIA in the last 168 hours.  CBC: Recent Labs  Lab 03/31/19 0832 04/01/19 0354  WBC 19.9* 15.5*  NEUTROABS  --  11.0*  HGB 11.5* 9.5*  HCT 39.2 30.6*  MCV 81.5 77.3*  PLT 232 196    INR: Recent Labs  Lab 03/31/19 0819 03/31/19 1926 04/01/19 0354  INR 1.9* 2.1* 2.2*    Other results:  EKG:    Imaging   Ct Chest W Contrast  Result Date: 03/31/2019 CLINICAL DATA:  Increased drainage from drive line site Monday evening with some "chest pain" EXAM: CT CHEST, ABDOMEN, AND PELVIS WITH CONTRAST TECHNIQUE: Multidetector CT imaging of the chest, abdomen and pelvis was performed following the standard protocol during bolus administration of intravenous contrast. CONTRAST:  OMNIPAQUE IOHEXOL 300 MG/ML  SOLN COMPARISON:  11/23/2018 FINDINGS: CT CHEST FINDINGS Cardiovascular: The heart size is normal. No substantial pericardial effusion. Coronary artery calcification is evident. Atherosclerotic calcification is noted in the wall of the thoracic aorta. Left ventricular assist device again noted. Mediastinum/Nodes: No mediastinal lymphadenopathy. There is no hilar lymphadenopathy. The esophagus has normal imaging features. There is no axillary lymphadenopathy. Lungs/Pleura: The central tracheobronchial airways are patent.  Centrilobular emphsyema noted. Dependent atelectasis noted both lower lobes. No pleural effusion. Musculoskeletal: No worrisome lytic or sclerotic osseous abnormality. Bilateral gynecomastia. CT ABDOMEN PELVIS FINDINGS Hepatobiliary: No suspicious focal abnormality within the liver parenchyma. Gallbladder decompressed. No intrahepatic or extrahepatic biliary dilation. Pancreas: No focal mass lesion. No dilatation of the main  duct. No intraparenchymal cyst. No peripancreatic edema. Spleen: No splenomegaly. No focal mass lesion. Adrenals/Urinary Tract: No adrenal nodule or mass. 3.4 cm water density lesion interpolar right kidney is compatible with a cyst. 5 mm low-density lesion lower pole left kidney is stable in the interval and too small to characterize. No evidence for hydroureter. The urinary bladder appears normal for the degree of distention. Stomach/Bowel: Stomach is unremarkable. No gastric wall thickening. No evidence of outlet obstruction. Duodenum is normally positioned as is the ligament of Treitz. No small bowel wall thickening. No small bowel dilatation. The terminal ileum is normal. The appendix is normal. No gross colonic mass. No colonic wall thickening. Diverticular changes are noted in the left colon without evidence of diverticulitis. Vascular/Lymphatic: There is abdominal aortic atherosclerosis without aneurysm. There is no gastrohepatic or hepatoduodenal ligament lymphadenopathy. No intraperitoneal or retroperitoneal lymphadenopathy. No pelvic sidewall lymphadenopathy. Reproductive: The prostate gland and seminal vesicles are unremarkable. Other: No intraperitoneal free fluid. Musculoskeletal: Soft tissue attenuation is seen in the subcutaneous fat around the drive line, but there is no focal or rim enhancing fluid collection. Previous exam showed a small focus of ill-defined edema/inflammation just caudal to the skin entry site in this has coalesced into a homogeneous focus of soft tissue attenuation with slight retraction of the overlying skin. The stranding/edema in the adjacent subcutaneous fat on the previous study has resolved. No worrisome lytic or sclerotic osseous abnormality. IMPRESSION: 1. No evidence for focal or rim enhancing fluid collection in the anterior abdominal wall along the drive line site. There is some confluent, homogeneous soft tissue around the drive line and just caudal to it, likely  reflecting granulation/evolving scar. The subcutaneous edema seen just caudal to the drive line on the previous study has resolved in the interval. 2. No acute findings identified in the chest, abdomen, or pelvis. 3.  Aortic Atherosclerois (ICD10-170.0) Electronically Signed   By: Kennith CenterEric  Mansell M.D.   On: 03/31/2019 15:17   Ct Abdomen Pelvis W Contrast  Result Date: 03/31/2019 CLINICAL DATA:  Increased drainage from drive line site Monday evening with some "chest pain" EXAM: CT CHEST, ABDOMEN, AND PELVIS WITH CONTRAST TECHNIQUE: Multidetector CT imaging of the chest, abdomen and pelvis was performed following the standard protocol during bolus administration of intravenous contrast. CONTRAST:  100mL OMNIPAQUE IOHEXOL 300 MG/ML  SOLN COMPARISON:  11/23/2018 FINDINGS: CT CHEST FINDINGS Cardiovascular: The heart size is normal. No substantial pericardial effusion. Coronary artery calcification is evident. Atherosclerotic calcification is noted in the wall of the thoracic aorta. Left ventricular assist device again noted. Mediastinum/Nodes: No mediastinal lymphadenopathy. There is no hilar lymphadenopathy. The esophagus has normal imaging features. There is no axillary lymphadenopathy. Lungs/Pleura: The central tracheobronchial airways are patent. Centrilobular emphsyema noted. Dependent atelectasis noted both lower lobes. No pleural effusion. Musculoskeletal: No worrisome lytic or sclerotic osseous abnormality. Bilateral gynecomastia. CT ABDOMEN PELVIS FINDINGS Hepatobiliary: No suspicious focal abnormality within the liver parenchyma. Gallbladder decompressed. No intrahepatic or extrahepatic biliary dilation. Pancreas: No focal mass lesion. No dilatation of the main duct. No intraparenchymal cyst. No peripancreatic edema. Spleen: No splenomegaly. No focal mass lesion. Adrenals/Urinary Tract: No adrenal nodule or mass. 3.4 cm water density lesion  interpolar right kidney is compatible with a cyst. 5 mm low-density  lesion lower pole left kidney is stable in the interval and too small to characterize. No evidence for hydroureter. The urinary bladder appears normal for the degree of distention. Stomach/Bowel: Stomach is unremarkable. No gastric wall thickening. No evidence of outlet obstruction. Duodenum is normally positioned as is the ligament of Treitz. No small bowel wall thickening. No small bowel dilatation. The terminal ileum is normal. The appendix is normal. No gross colonic mass. No colonic wall thickening. Diverticular changes are noted in the left colon without evidence of diverticulitis. Vascular/Lymphatic: There is abdominal aortic atherosclerosis without aneurysm. There is no gastrohepatic or hepatoduodenal ligament lymphadenopathy. No intraperitoneal or retroperitoneal lymphadenopathy. No pelvic sidewall lymphadenopathy. Reproductive: The prostate gland and seminal vesicles are unremarkable. Other: No intraperitoneal free fluid. Musculoskeletal: Soft tissue attenuation is seen in the subcutaneous fat around the drive line, but there is no focal or rim enhancing fluid collection. Previous exam showed a small focus of ill-defined edema/inflammation just caudal to the skin entry site in this has coalesced into a homogeneous focus of soft tissue attenuation with slight retraction of the overlying skin. The stranding/edema in the adjacent subcutaneous fat on the previous study has resolved. No worrisome lytic or sclerotic osseous abnormality. IMPRESSION: 1. No evidence for focal or rim enhancing fluid collection in the anterior abdominal wall along the drive line site. There is some confluent, homogeneous soft tissue around the drive line and just caudal to it, likely reflecting granulation/evolving scar. The subcutaneous edema seen just caudal to the drive line on the previous study has resolved in the interval. 2. No acute findings identified in the chest, abdomen, or pelvis. 3.  Aortic Atherosclerois (ICD10-170.0)  Electronically Signed   By: Kennith Center M.D.   On: 03/31/2019 15:17      Medications:     Scheduled Medications:  aspirin EC  81 mg Oral Daily   atorvastatin  20 mg Oral q1800   carbamide peroxide  5 drop Both EARS TID   DULoxetine  60 mg Oral Daily   gabapentin  600 mg Oral TID   insulin aspart  0-15 Units Subcutaneous TID WC   insulin aspart  0-5 Units Subcutaneous QHS   pantoprazole  40 mg Oral Daily   sodium chloride flush  10-40 mL Intracatheter Q12H   spironolactone  25 mg Oral Daily   thiamine  100 mg Oral Daily   vitamin B-12  1,000 mcg Oral Daily   zinc sulfate  220 mg Oral Daily     Infusions:   ceFAZolin (ANCEF) IV 2 g (04/01/19 1610)     PRN Medications:  acetaminophen, docusate sodium, fluticasone, hydrOXYzine, ipratropium-albuterol, methocarbamol, nicotine, nystatin, ondansetron (ZOFRAN) IV, oxyCODONE, polyethylene glycol, sodium chloride flush, traMADol   Assessment/Plan:    1. Driveline infection: MSSA driveline infection in 1/20.  Completed course of doxycycline as outpatient 4/22 for driveline infection. Admitted with upper abdominal tenderness and increased driveline drainage, WBCs 19.9 => 15.5 today.  CT chest/abd/pelvis did not show abscess. Now with MSSA in blood again.  - He is on cefazolin, will continue 6 wks.  Per ID, do not absolutely need TEE at this point unless there are additional complications.  He will need a suppressive antibiotic po after the 6 wks of cefazolin.  - For now, no plan for debridement.  Can restart warfarin.  - Will need midline exchanged for PICC when blood clears.   2. Chronic systolic CHF: Nonischemic cardiomyopathy, now  s/p Heartmate 3 LVAD in 9/19. LVAD parameters stable except for frequent PI events (no low flows). This is chronic for him. MAP 80. He is not volume overloaded.  LDH 129, INR 2.2 today.  - He does not need Lasix.  - Hold Entresto and amlodipine for now with infection/concern for  sepsis. Can continue spironolactone for now.  - Continue ASA 81 daily.  - Can continue warfarin for now given no plans for I&D at this point. - Should be transplant candidate eventually if he can stay off cigarettes,he has almost quit. 3. Smoking: Knows he needs to quit to be a transplant candidate and working on it. Continue nicotine patches while inpatient 4. RLE DVT: On warfarin for LVAD.  5. OSA: Continue CPAP.  5. Hyperlipidemia: Continue atorvastatin.  6. Type II diabetes: SSI while inpatient.   I reviewed the LVAD parameters from today, and compared the results to the patient's prior recorded data.  No programming changes were made.  The LVAD is functioning within specified parameters.  The patient performs LVAD self-test daily.  LVAD interrogation was negative for any significant power changes, alarms or PI events/speed drops.  LVAD equipment check completed and is in good working order.  Back-up equipment present.   LVAD education done on emergency procedures and precautions and reviewed exit site care.  Length of Stay: 1  Marca Ancona, MD 04/01/2019, 12:01 PM  VAD Team --- VAD ISSUES ONLY--- Pager [phone] (7am - 7am)  Advanced Heart Failure Team  Pager [phone] (M-F; 7a - 4p)  Please contact [facility] for night-coverage after hours (4p -7a ) and weekends on [email or web address]

## 2019-04-01 NOTE — Progress Notes (Signed)
Pt. States he can place his cpap on when he is ready.

## 2019-04-01 NOTE — Progress Notes (Signed)
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   HM III LVAD implanted on 08/03/18 by Dr. Laneta Simmers under Destination Therapy criteria.  Pt awake, sitting in chair. Back to bed for dressing change.   Vital signs: Temp: 98.4 HR: 89 Doppler Pressure:  86 Automatic BP:  88/73 (80) O2 Sat: 100% RA Wt: 175.4>176.6 lbs    LVAD interrogation reveals:  Speed: 5700 Flow: 5.4 Power:  5.1 PI: 2.2 Alarms:   Events:   Hematocrit: 31 Fixed speed: 5700 Low speed limit: 5400  Drive Line:  Daily dressing changes using daily kits; pack silver strips in tunneling areas. Bedside nurse may change.  Existing dressing removed using sterile technique. Dressing and site with yellow/tan drainage; increased drainage than yesterday. Site cleansed with betadine swab x 2. tunneling 1 cm on medial and 2 cm on lateral side of DL exit site; packed with silver strips; dry gauze dressing and occlusive tape applied. Less tenderness around exit site today.        Labs:  LDH trend: 148>129  INR trend: 2.1>2.2  WBC trend: 19.9>15.5   Anticoagulation Plan: -INR Goal: 2.0 - 2.5 -ASA Dose: 81 mg  Infection:  - 03/31/19 blood cultures x 2>>gm pos cocci  - 03/31/19 DL wound culture>>rare gram positive cocci    Plan/Recommendations:  1. Daily dressing changes using daily kits; pack tunneled areas with silver strips. Bedside nurse may change. 2. Call VAD Pager if any VAD equipment or drive line issues.  Hessie Diener RN, VAD Coordinator [phone]

## 2019-04-01 NOTE — Progress Notes (Signed)
  Subjective: Feels better. WBC down Epigastric tenderness better Agree with ID rec Rx for positive blood cultures  Objective: Vital signs in last 24 hours: Temp:  [98.4 F (36.9 C)-99.7 F (37.6 C)] 99.2 F (37.3 C) (05/14 1448) Pulse Rate:  [88-99] 99 (05/14 1448) Cardiac Rhythm: Normal sinus rhythm (05/14 0844) Resp:  [17-21] 21 (05/14 1135) BP: (81-121)/(26-73) 85/39 (05/14 1135) SpO2:  [97 %-100 %] 99 % (05/14 1448) Weight:  [80.1 kg] 80.1 kg (05/14 0649)  Hemodynamic parameters for last 24 hours:    Intake/Output from previous day: 05/13 0701 - 05/14 0700 In: 1150 [P.O.:600; IV Piggyback:550] Out: 500 [Urine:500] Intake/Output this shift: Total I/O In: 1580 [P.O.:1480; IV Piggyback:100] Out: -   Alert and comfortable Lungs clear Normal VAD hum  Lab Results: Recent Labs    03/31/19 0832 04/01/19 0354  WBC 19.9* 15.5*  HGB 11.5* 9.5*  HCT 39.2 30.6*  PLT 232 196   BMET:  Recent Labs    03/31/19 1106 04/01/19 0354  NA 133* 136  K 3.6 3.4*  CL 97* 99  CO2 23 26  GLUCOSE 125* 114*  BUN 10 10  CREATININE 1.25* 1.11  CALCIUM 9.0 8.9    PT/INR:  Recent Labs    04/01/19 0354  LABPROT 23.8*  INR 2.2*   ABG    Component Value Date/Time   PHART 7.473 (H) 08/05/2018 0345   HCO3 26.9 08/05/2018 0345   TCO2 26 08/04/2018 1622   O2SAT 73.6 08/12/2018 0345   CBG (last 3)  Recent Labs    03/31/19 2101 04/01/19 0635 04/01/19 1117  GLUCAP 134* 119* 97    Assessment/Plan: S/P  Drive line tunnel infection/ cellulitis Continue current hospitalized care   LOS: 1 day    [name] 04/01/2019

## 2019-04-01 NOTE — Progress Notes (Signed)
PHARMACY - PHYSICIAN COMMUNICATION CRITICAL VALUE ALERT - BLOOD CULTURE IDENTIFICATION (BCID)  Theodore Welch is an 58 y.o. male who presented to [facility] on 03/31/2019 with a chief complaint of increased drainage from driveline site with some CP; direct admitted by HF clinic given h/o driveline infection and concern for same.  Assessment:  1/4 bottles growing MSSA; currently on vanc and Zosyn for concern for driveline infection.  Name of physician (or Provider) Contacted: Dr Shirlee Latch  Current antibiotics: Vancomycin and Zosyn  Changes to prescribed antibiotics recommended:  Recommendations accepted by provider: Change to Ancef.  Results for orders placed or performed in visit on 03/31/19  Blood Culture ID Panel (Reflexed) (Collected: 03/31/2019  8:55 AM)  Result Value Ref Range   Enterococcus species NOT DETECTED NOT DETECTED   Listeria monocytogenes NOT DETECTED NOT DETECTED   Staphylococcus species DETECTED (A) NOT DETECTED   Staphylococcus aureus (BCID) DETECTED (A) NOT DETECTED   Methicillin resistance NOT DETECTED NOT DETECTED   Streptococcus species NOT DETECTED NOT DETECTED   Streptococcus agalactiae NOT DETECTED NOT DETECTED   Streptococcus pneumoniae NOT DETECTED NOT DETECTED   Streptococcus pyogenes NOT DETECTED NOT DETECTED   Acinetobacter baumannii NOT DETECTED NOT DETECTED   Enterobacteriaceae species NOT DETECTED NOT DETECTED   Enterobacter cloacae complex NOT DETECTED NOT DETECTED   Escherichia coli NOT DETECTED NOT DETECTED   Klebsiella oxytoca NOT DETECTED NOT DETECTED   Klebsiella pneumoniae NOT DETECTED NOT DETECTED   Proteus species NOT DETECTED NOT DETECTED   Serratia marcescens NOT DETECTED NOT DETECTED   Haemophilus influenzae NOT DETECTED NOT DETECTED   Neisseria meningitidis NOT DETECTED NOT DETECTED   Pseudomonas aeruginosa NOT DETECTED NOT DETECTED   Candida albicans NOT DETECTED NOT DETECTED   Candida glabrata NOT DETECTED NOT DETECTED   Candida  krusei NOT DETECTED NOT DETECTED   Candida parapsilosis NOT DETECTED NOT DETECTED   Candida tropicalis NOT DETECTED NOT DETECTED    Theodore Welch, PharmD, BCPS  04/01/2019  12:30 AM

## 2019-04-01 NOTE — Progress Notes (Addendum)
Pharmacy Anticoagulation Note  Theodore Welch is a 58 y.o. male admitted on 03/31/19 with LVAD driveline infection. Pharmacy has been consulted for heparin dosing.  INR came back this morning at 2.2- last dose is 5/12. Hgb 9.5, plt 196. LDH 129. No s/sx of bleeding. Since no plan for I&D, okay to continue warfarin.   PTA regimen 5 mg daily except 7.5 on T/Sat  Goals: INR goal: 2-2.5  Plan: Will continue warfarin 5 mg tonight Monitor daily INR, CBC, and for s/sx of bleeding   Thank you for allowing pharmacy to be a part of this patient's care.  Sherron Monday, PharmD, BCCCP Clinical Pharmacist  Pager: [phone] Phone: [phone] 04/01/2019 8:09 AM

## 2019-04-02 DIAGNOSIS — Z95828 Presence of other vascular implants and grafts: Secondary | ICD-10-CM

## 2019-04-02 DIAGNOSIS — D72829 Elevated white blood cell count, unspecified: Secondary | ICD-10-CM

## 2019-04-02 LAB — GLUCOSE, CAPILLARY
Glucose-Capillary: 147 mg/dL — ABNORMAL HIGH (ref 70–99)
Glucose-Capillary: 77 mg/dL (ref 70–99)
Glucose-Capillary: 161 mg/dL — ABNORMAL HIGH (ref 70–99)

## 2019-04-02 LAB — CULTURE, BLOOD (ROUTINE X 2): Special Requests: ADEQUATE

## 2019-04-02 LAB — CBC WITH DIFFERENTIAL/PLATELET
Abs Immature Granulocytes: 0.09 10*3/uL — ABNORMAL HIGH (ref 0.00–0.07)
Basophils Absolute: 0.1 10*3/uL (ref 0.0–0.1)
Basophils Relative: 1 %
Eosinophils Absolute: 0.1 10*3/uL (ref 0.0–0.5)
Eosinophils Relative: 1 %
HCT: 31.1 % — ABNORMAL LOW (ref 39.0–52.0)
Hemoglobin: 9.6 g/dL — ABNORMAL LOW (ref 13.0–17.0)
Immature Granulocytes: 1 %
Lymphocytes Relative: 9 %
Lymphs Abs: 1.2 10*3/uL (ref 0.7–4.0)
MCH: 23.8 pg — ABNORMAL LOW (ref 26.0–34.0)
MCHC: 30.9 g/dL (ref 30.0–36.0)
MCV: 77 fL — ABNORMAL LOW (ref 80.0–100.0)
Monocytes Absolute: 2.6 10*3/uL — ABNORMAL HIGH (ref 0.1–1.0)
Monocytes Relative: 20 %
Neutro Abs: 9 10*3/uL — ABNORMAL HIGH (ref 1.7–7.7)
Neutrophils Relative %: 68 %
Platelets: 227 10*3/uL (ref 150–400)
RBC: 4.04 MIL/uL — ABNORMAL LOW (ref 4.22–5.81)
RDW: 16 % — ABNORMAL HIGH (ref 11.5–15.5)
WBC: 13.1 10*3/uL — ABNORMAL HIGH (ref 4.0–10.5)
nRBC: 0 % (ref 0.0–0.2)

## 2019-04-02 LAB — BASIC METABOLIC PANEL
Anion gap: 11 (ref 5–15)
BUN: 8 mg/dL (ref 6–20)
CO2: 26 mmol/L (ref 22–32)
Calcium: 8.6 mg/dL — ABNORMAL LOW (ref 8.9–10.3)
Chloride: 100 mmol/L (ref 98–111)
Creatinine, Ser: 1.04 mg/dL (ref 0.61–1.24)
GFR calc Af Amer: >60 mL/min (ref >60)
GFR calc non Af Amer: >60 mL/min (ref >60)
Glucose, Bld: 115 mg/dL — ABNORMAL HIGH (ref 70–99)
Potassium: 3.5 mmol/L (ref 3.5–5.1)
Sodium: 137 mmol/L (ref 135–145)

## 2019-04-02 LAB — PROTIME-INR
INR: 2.1 — ABNORMAL HIGH (ref 0.8–1.2)
Prothrombin Time: 23.4 seconds — ABNORMAL HIGH (ref 11.4–15.2)

## 2019-04-02 LAB — LACTATE DEHYDROGENASE: LDH: 163 U/L (ref 98–192)

## 2019-04-02 MED ORDER — POTASSIUM CHLORIDE CRYS ER 20 MEQ PO TBCR
20.0000 meq | EXTENDED_RELEASE_TABLET | Freq: Once | ORAL | Status: AC
  Administered 2019-04-02: 20 meq via ORAL
  Filled 2019-04-02: qty 1

## 2019-04-02 MED ORDER — WARFARIN SODIUM 5 MG PO TABS
5.0000 mg | ORAL_TABLET | Freq: Once | ORAL | Status: AC
  Administered 2019-04-02: 5 mg via ORAL
  Filled 2019-04-02: qty 1

## 2019-04-02 MED ORDER — MELATONIN 3 MG PO TABS
3.0000 mg | ORAL_TABLET | Freq: Once | ORAL | Status: AC
  Administered 2019-04-02: 3 mg via ORAL
  Filled 2019-04-02: qty 1

## 2019-04-02 MED ORDER — NON FORMULARY: 2.0000 mg | Freq: Once | Status: DC

## 2019-04-02 NOTE — TOC Initial Note (Signed)
Transition of Care [facility]) - Initial/Assessment Note    Patient Details  Name: Theodore Welch MRN: [id number] Date of Birth: 10/20/1961  [facility] CM/SW Contact:    Cherylann Parr, RN Phone Number: 04/02/2019, 12:59 PM  Clinical Narrative:  LVAD hx - admitted  with a drive line infection.  Pt is independent from home.  Pt may need long term IV antibiotics at discharge - pt previously used [facility] for HH/IV - pt will be provided medicare.gov list if it is determined that HH/IV needs are determined necessary at discharge                 Expected Discharge Plan: Home w Home Health Services Barriers to Discharge: Continued Medical Work up   Patient Goals and CMS Choice        Expected Discharge Plan and Services Expected Discharge Plan: Home w Home Health Services       Living arrangements for the past 2 months: Single Family Home Expected Discharge Date: 04/07/19                                    Prior Living Arrangements/Services Living arrangements for the past 2 months: Single Family Home                     Activities of Daily Living Home Assistive Devices/Equipment: None ADL Screening (condition at time of admission) Patient's cognitive ability adequate to safely complete daily activities?: Yes Is the patient deaf or have difficulty hearing?: No Does the patient have difficulty seeing, even when wearing glasses/contacts?: No Does the patient have difficulty concentrating, remembering, or making decisions?: No Patient able to express need for assistance with ADLs?: Yes Does the patient have difficulty dressing or bathing?: No Independently performs ADLs?: Yes (appropriate for developmental age) Does the patient have difficulty walking or climbing stairs?: No Weakness of Legs: None Weakness of Arms/Hands: None  Permission Sought/Granted                  Emotional Assessment     Affect (typically observed):  Accepting, Adaptable Orientation: : Oriented to Self, Oriented to Place, Oriented to  Time, Oriented to Situation   Psych Involvement: No (comment)  Admission diagnosis:  Drive line infection  Patient Active Problem List   Diagnosis Date Noted  . MSSA bacteremia 04/01/2019  . 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 Chronic systolic heart failure (HCC) 11/28/2018  . Complication involving left ventricular assist device (LVAD) 11/23/2018  . OSA (obstructive sleep apnea) 09/02/2018  . LVAD (left ventricular assist device) present (HCC) 08/03/2018  . DVT (deep venous thrombosis) (HCC) 08/02/2018  . Goals of care, counseling/discussion   . Advance care planning   . Palliative care encounter   . Malnutrition of moderate degree 07/25/2018  . Ascites   . COPD (chronic obstructive pulmonary disease) (HCC) 07/21/2018  . HTN (hypertension) 07/21/2018  . Tobacco use 07/21/2018  . Congestive heart failure (HCC) 07/21/2018  . CHF (congestive heart failure) (HCC) 07/21/2018  . Type 2 diabetes mellitus (HCC) 07/21/2018  . Hyperlipidemia 07/21/2018  . Fibromyalgia syndrome 01/27/2017  . Myofascial pain syndrome, cervical 01/27/2017  . Lumbar radiculopathy 01/24/2014  . History of lumbar laminectomy for spinal cord decompression 12/07/2013  . 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   PCP:  [name],  MD Pharmacy:   [facility] [address] [place], [place] - [address] [place] [address] Phone: [phone] Fax: [phone]     Social Determinants of Health (SDOH) Interventions    Readmission Risk Interventions Readmission Risk Prevention Plan 04/02/2019  Transportation Screening Complete  HRI or Home Care Consult Complete  Social Work Consult for Recovery Care Planning/Counseling Complete  Palliative Care Screening Not Applicable  Medication  Review Oceanographer) Complete  Some recent data might be hidden

## 2019-04-02 NOTE — Plan of Care (Signed)
  Problem: Clinical Measurements: Goal: Will remain free from infection Outcome: Progressing Goal: Cardiovascular complication will be avoided Outcome: Progressing   Problem: Education: Goal: Patient will be able to verbalize current INR target range and antiplatelet therapy for discharge home Outcome: Progressing   Problem: Clinical Measurements: Goal: Ability to maintain clinical measurements within normal limits will improve Outcome: Progressing Goal: Will remain free from infection Outcome: Progressing Goal: Diagnostic test results will improve Outcome: Progressing Goal: Cardiovascular complication will be avoided Outcome: Progressing   Problem: Pain Managment: Goal: General experience of comfort will improve Outcome: Progressing   Problem: Safety: Goal: Ability to remain free from injury will improve Outcome: Progressing   Problem: Skin Integrity: Goal: Risk for impaired skin integrity will decrease Outcome: Progressing   

## 2019-04-02 NOTE — Progress Notes (Signed)
ANTICOAGULATION CONSULT NOTE - Follow Up Consult  Pharmacy Consult for Coumadin Indication: LVAD  No Known Allergies  Patient Measurements: Height: 5\' 10"  (177.8 cm) Weight: 176 lb 9.4 oz (80.1 kg) IBW/kg (Calculated) : 73   Vital Signs: Temp: 98.1 F (36.7 C) (05/15 1114) Temp Source: Oral (05/15 1114) BP: 80/50 (05/15 1114) Pulse Rate: 88 (05/15 1114)  Labs: Recent Labs    03/31/19 0832 03/31/19 1106 03/31/19 1926 04/01/19 0354 04/02/19 0500  HGB 11.5*  --   --  9.5* 9.6*  HCT 39.2  --   --  30.6* 31.1*  PLT 232  --   --  196 227  LABPROT  --   --  23.1* 23.8* 23.4*  INR  --   --  2.1* 2.2* 2.1*  CREATININE  --  1.25*  --  1.11 1.04    Estimated Creatinine Clearance: 78 mL/min (by C-G formula based on SCr of 1.04 mg/dL).  Assessment: Theodore Welch is a 58 y.o. male admitted on 03/31/19 with LVAD driveline infection. Pharmacy has been consulted for heparin dosing.  INR today 2.1. CBC, LDH stable. No s/sx of bleeding. Since no plan for I&D, okay to continue warfarin.   PTA regimen 5 mg daily except 7.5 on T/Sat  Goal of Therapy:  INR 2-2.5 Monitor platelets by anticoagulation protocol: Yes   Plan:  1. Coumadin 5 mg x 1 tonight. 2. Daily INR.  Jenetta Downer, [facility] Clinical Pharmacist Phone [phone]  04/02/2019 1:02 PM

## 2019-04-02 NOTE — Progress Notes (Signed)
Patient ID: Theodore Welch, male   DOB: February 15, 1961, 58 y.o.   MRN: [id number]   Advanced Heart Failure VAD Team Note  PCP-Cardiologist: No primary care provider on file.   Subjective:    Less epigastric tenderness.  Afebrile, WBCs down to 13.  MSSA in blood and wound cultures, now on cefazolin. MAP stable at 86 today, he is off Entresto and amlodipine currently.   CT chest/abdomen/pelvis did not show abscess at driveline site.    LVAD INTERROGATION:  HeartMate 3 LVAD:   Flow 5.0 liters/min, speed 5700, power 4.4, PI 3.3.  He has frequent PI events (chronic)  Objective:    Vital Signs:   Temp:  [98.1 F (36.7 C)-100.2 F (37.9 C)] 98.1 F (36.7 C) (05/15 1114) Pulse Rate:  [88-115] 88 (05/15 1114) Resp:  [20-35] 22 (05/14 2321) BP: (80-116)/(49-91) 80/50 (05/15 1114) SpO2:  [98 %-100 %] 100 % (05/15 1114) Weight:  [80.1 kg] 80.1 kg (05/15 0500) Last BM Date: 03/30/19 Mean arterial Pressure 80  Intake/Output:   Intake/Output Summary (Last 24 hours) at 04/02/2019 1439 Last data filed at 04/02/2019 1300 Gross per 24 hour  Intake 1366.49 ml  Output 450 ml  Net 916.49 ml     Physical Exam    General: Well appearing this am. NAD.  HEENT: Normal. Neck: Supple, JVP 7-8 cm. Carotids OK.  Cardiac:  Mechanical heart sounds with LVAD hum present.  Lungs:  CTAB, normal effort.  Abdomen:  NT, ND, no HSM. No bruits or masses. +BS  LVAD exit site: Less drainage today.  Tender spot along driveline track in epigastric area but much less so than at admission. Stabilization device present and accurately applied. Driveline dressing changed daily per sterile technique. Extremities:  Warm and dry. No cyanosis, clubbing, rash, or edema.  Neuro:  Alert & oriented x 3. Cranial nerves grossly intact. Moves all 4 extremities w/o difficulty. Affect pleasant     Telemetry   NSR with occasional PVCs (personally reviewed)  Labs   Basic Metabolic Panel: Recent Labs  Lab 03/31/19 1106  04/01/19 0354 04/02/19 0500  NA 133* 136 137  K 3.6 3.4* 3.5  CL 97* 99 100  CO2 23 26 26   GLUCOSE 125* 114* 115*  BUN 10 10 8   CREATININE 1.25* 1.11 1.04  CALCIUM 9.0 8.9 8.6*    Liver Function Tests: Recent Labs  Lab 03/31/19 1106  AST 19  ALT 14  ALKPHOS 132*  BILITOT 1.2  PROT 7.3  ALBUMIN 3.1*   No results for input(s): LIPASE, AMYLASE in the last 168 hours. No results for input(s): AMMONIA in the last 168 hours.  CBC: Recent Labs  Lab 03/31/19 0832 04/01/19 0354 04/02/19 0500  WBC 19.9* 15.5* 13.1*  NEUTROABS  --  11.0* 9.0*  HGB 11.5* 9.5* 9.6*  HCT 39.2 30.6* 31.1*  MCV 81.5 77.3* 77.0*  PLT 232 196 227    INR: Recent Labs  Lab 03/31/19 0819 03/31/19 1926 04/01/19 0354 04/02/19 0500  INR 1.9* 2.1* 2.2* 2.1*    Other results:  EKG:    Imaging   Ct Chest W Contrast  Result Date: 03/31/2019 CLINICAL DATA:  Increased drainage from drive line site Monday evening with some "chest pain" EXAM: CT CHEST, ABDOMEN, AND PELVIS WITH CONTRAST TECHNIQUE: Multidetector CT imaging of the chest, abdomen and pelvis was performed following the standard protocol during bolus administration of intravenous contrast. CONTRAST:  OMNIPAQUE IOHEXOL 300 MG/ML  SOLN COMPARISON:  11/23/2018 FINDINGS: CT CHEST FINDINGS  Cardiovascular: The heart size is normal. No substantial pericardial effusion. Coronary artery calcification is evident. Atherosclerotic calcification is noted in the wall of the thoracic aorta. Left ventricular assist device again noted. Mediastinum/Nodes: No mediastinal lymphadenopathy. There is no hilar lymphadenopathy. The esophagus has normal imaging features. There is no axillary lymphadenopathy. Lungs/Pleura: The central tracheobronchial airways are patent. Centrilobular emphsyema noted. Dependent atelectasis noted both lower lobes. No pleural effusion. Musculoskeletal: No worrisome lytic or sclerotic osseous abnormality. Bilateral gynecomastia. CT  ABDOMEN PELVIS FINDINGS Hepatobiliary: No suspicious focal abnormality within the liver parenchyma. Gallbladder decompressed. No intrahepatic or extrahepatic biliary dilation. Pancreas: No focal mass lesion. No dilatation of the main duct. No intraparenchymal cyst. No peripancreatic edema. Spleen: No splenomegaly. No focal mass lesion. Adrenals/Urinary Tract: No adrenal nodule or mass. 3.4 cm water density lesion interpolar right kidney is compatible with a cyst. 5 mm low-density lesion lower pole left kidney is stable in the interval and too small to characterize. No evidence for hydroureter. The urinary bladder appears normal for the degree of distention. Stomach/Bowel: Stomach is unremarkable. No gastric wall thickening. No evidence of outlet obstruction. Duodenum is normally positioned as is the ligament of Treitz. No small bowel wall thickening. No small bowel dilatation. The terminal ileum is normal. The appendix is normal. No gross colonic mass. No colonic wall thickening. Diverticular changes are noted in the left colon without evidence of diverticulitis. Vascular/Lymphatic: There is abdominal aortic atherosclerosis without aneurysm. There is no gastrohepatic or hepatoduodenal ligament lymphadenopathy. No intraperitoneal or retroperitoneal lymphadenopathy. No pelvic sidewall lymphadenopathy. Reproductive: The prostate gland and seminal vesicles are unremarkable. Other: No intraperitoneal free fluid. Musculoskeletal: Soft tissue attenuation is seen in the subcutaneous fat around the drive line, but there is no focal or rim enhancing fluid collection. Previous exam showed a small focus of ill-defined edema/inflammation just caudal to the skin entry site in this has coalesced into a homogeneous focus of soft tissue attenuation with slight retraction of the overlying skin. The stranding/edema in the adjacent subcutaneous fat on the previous study has resolved. No worrisome lytic or sclerotic osseous abnormality.  IMPRESSION: 1. No evidence for focal or rim enhancing fluid collection in the anterior abdominal wall along the drive line site. There is some confluent, homogeneous soft tissue around the drive line and just caudal to it, likely reflecting granulation/evolving scar. The subcutaneous edema seen just caudal to the drive line on the previous study has resolved in the interval. 2. No acute findings identified in the chest, abdomen, or pelvis. 3.  Aortic Atherosclerois (ICD10-170.0) Electronically Signed   By: Kennith Center M.D.   On: 03/31/2019 15:17   Ct Abdomen Pelvis W Contrast  Result Date: 03/31/2019 CLINICAL DATA:  Increased drainage from drive line site Monday evening with some "chest pain" EXAM: CT CHEST, ABDOMEN, AND PELVIS WITH CONTRAST TECHNIQUE: Multidetector CT imaging of the chest, abdomen and pelvis was performed following the standard protocol during bolus administration of intravenous contrast. CONTRAST:  OMNIPAQUE IOHEXOL 300 MG/ML  SOLN COMPARISON:  11/23/2018 FINDINGS: CT CHEST FINDINGS Cardiovascular: The heart size is normal. No substantial pericardial effusion. Coronary artery calcification is evident. Atherosclerotic calcification is noted in the wall of the thoracic aorta. Left ventricular assist device again noted. Mediastinum/Nodes: No mediastinal lymphadenopathy. There is no hilar lymphadenopathy. The esophagus has normal imaging features. There is no axillary lymphadenopathy. Lungs/Pleura: The central tracheobronchial airways are patent. Centrilobular emphsyema noted. Dependent atelectasis noted both lower lobes. No pleural effusion. Musculoskeletal: No worrisome lytic or sclerotic osseous abnormality.  Bilateral gynecomastia. CT ABDOMEN PELVIS FINDINGS Hepatobiliary: No suspicious focal abnormality within the liver parenchyma. Gallbladder decompressed. No intrahepatic or extrahepatic biliary dilation. Pancreas: No focal mass lesion. No dilatation of the main duct. No  intraparenchymal cyst. No peripancreatic edema. Spleen: No splenomegaly. No focal mass lesion. Adrenals/Urinary Tract: No adrenal nodule or mass. 3.4 cm water density lesion interpolar right kidney is compatible with a cyst. 5 mm low-density lesion lower pole left kidney is stable in the interval and too small to characterize. No evidence for hydroureter. The urinary bladder appears normal for the degree of distention. Stomach/Bowel: Stomach is unremarkable. No gastric wall thickening. No evidence of outlet obstruction. Duodenum is normally positioned as is the ligament of Treitz. No small bowel wall thickening. No small bowel dilatation. The terminal ileum is normal. The appendix is normal. No gross colonic mass. No colonic wall thickening. Diverticular changes are noted in the left colon without evidence of diverticulitis. Vascular/Lymphatic: There is abdominal aortic atherosclerosis without aneurysm. There is no gastrohepatic or hepatoduodenal ligament lymphadenopathy. No intraperitoneal or retroperitoneal lymphadenopathy. No pelvic sidewall lymphadenopathy. Reproductive: The prostate gland and seminal vesicles are unremarkable. Other: No intraperitoneal free fluid. Musculoskeletal: Soft tissue attenuation is seen in the subcutaneous fat around the drive line, but there is no focal or rim enhancing fluid collection. Previous exam showed a small focus of ill-defined edema/inflammation just caudal to the skin entry site in this has coalesced into a homogeneous focus of soft tissue attenuation with slight retraction of the overlying skin. The stranding/edema in the adjacent subcutaneous fat on the previous study has resolved. No worrisome lytic or sclerotic osseous abnormality. IMPRESSION: 1. No evidence for focal or rim enhancing fluid collection in the anterior abdominal wall along the drive line site. There is some confluent, homogeneous soft tissue around the drive line and just caudal to it, likely reflecting  granulation/evolving scar. The subcutaneous edema seen just caudal to the drive line on the previous study has resolved in the interval. 2. No acute findings identified in the chest, abdomen, or pelvis. 3.  Aortic Atherosclerois (ICD10-170.0) Electronically Signed   By: Kennith Center M.D.   On: 03/31/2019 15:17     Medications:     Scheduled Medications:  aspirin EC  81 mg Oral Daily   atorvastatin  20 mg Oral q1800   carbamide peroxide  5 drop Both EARS TID   DULoxetine  60 mg Oral Daily   gabapentin  600 mg Oral TID   insulin aspart  0-15 Units Subcutaneous TID WC   insulin aspart  0-5 Units Subcutaneous QHS   pantoprazole  40 mg Oral Daily   sodium chloride flush  10-40 mL Intracatheter Q12H   spironolactone  25 mg Oral Daily   thiamine  100 mg Oral Daily   vitamin B-12  1,000 mcg Oral Daily   warfarin  5 mg Oral ONCE-1800   Warfarin - Pharmacist Dosing Inpatient   Does not apply q1800   zinc sulfate  220 mg Oral Daily    Infusions:  sodium chloride 250 mL (04/01/19 1435)    ceFAZolin (ANCEF) IV 2 g (04/02/19 1410)    PRN Medications: sodium chloride, acetaminophen, docusate sodium, fluticasone, hydrOXYzine, ipratropium-albuterol, methocarbamol, nicotine, nystatin, ondansetron (ZOFRAN) IV, oxyCODONE, polyethylene glycol, sodium chloride flush, traMADol   Assessment/Plan:    1. Driveline infection: MSSA driveline infection in 1/20.  Completed course of doxycycline as outpatient 4/22 for driveline infection. Admitted with upper abdominal tenderness and increased driveline drainage, WBCs 19.9 => 15.5 =>  13 today.  CT chest/abd/pelvis did not show abscess. Now with MSSA in blood and wound cultures again.  - He is on cefazolin, will continue 6 wks.  Per ID, do not absolutely need TEE at this point unless there are additional complications.  He will need a suppressive antibiotic po after the 6 wks of cefazolin.  - For now, no plan for debridement.   - Will need  midline exchanged for PICC if 2nd set of cultures remain negative (hopefully Monday).   2. 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 LVAD parameters stable except for frequent PI events (no low flows). This is chronic for him. MAP 86. He is not volume overloaded.  LDH 163, INR 2.1 today.  - He does not need Lasix.  - Entresto and amlodipine were held with infection/concern for sepsis, MAP in 80s without them currently. He remains on spironolactone.   - Continue ASA 81 daily.  - Can continue warfarin for now given no plans for I&D at this point.  - Should be transplant candidate eventually if he can stay off cigarettes,he has almost quit. 3. Smoking: Knows he needs to quit to be a transplant candidate and working on it.  4. RLE DVT: On warfarin for LVAD.  5. OSA: Continue CPAP.  5. Hyperlipidemia: Continue atorvastatin.  6. Type II diabetes: SSI while inpatient.   I reviewed the LVAD parameters from today, and compared the results to the patient's prior recorded data.  No programming changes were made.  The LVAD is functioning within specified parameters.  The patient performs LVAD self-test daily.  LVAD interrogation was negative for any significant power changes, alarms or PI events/speed drops.  LVAD equipment check completed and is in good working order.  Back-up equipment present.   LVAD education done on emergency procedures and precautions and reviewed exit site care.  Length of Stay: 2  Marca Ancona, MD 04/02/2019, 2:39 PM  VAD Team --- VAD ISSUES ONLY--- Pager [phone] (7am - 7am)  Advanced Heart Failure Team  Pager [phone] (M-F; 7a - 4p)  Please contact [facility] for night-coverage after hours (4p -7a ) and weekends on [email or web address]

## 2019-04-02 NOTE — Progress Notes (Signed)
PHARMACY CONSULT NOTE FOR:  OUTPATIENT  PARENTERAL ANTIBIOTIC THERAPY (OPAT)  Indication: MSSA bactreremia Regimen: cefazolin 2g IV q8h End date: 05/13/2019  IV antibiotic discharge orders are pended. To discharging provider:  please sign these orders via discharge navigator,  Select New Orders & click on the button choice - Manage This Unsigned Work.     Thank you for allowing pharmacy to be a part of this patient's care.  Mickeal Skinner 04/02/2019, 1:39 PM

## 2019-04-02 NOTE — Plan of Care (Signed)
  Problem: Clinical Measurements: Goal: Will remain free from infection Outcome: Progressing Goal: Cardiovascular complication will be avoided Outcome: Progressing   Problem: Education: Goal: Patient will be able to verbalize current INR target range and antiplatelet therapy for discharge home Outcome: Progressing   Problem: Clinical Measurements: Goal: Ability to maintain clinical measurements within normal limits will improve Outcome: Progressing Goal: Will remain free from infection Outcome: Progressing Goal: Diagnostic test results will improve Outcome: Progressing Goal: Cardiovascular complication will be avoided Outcome: Progressing   Problem: Pain Managment: Goal: General experience of comfort will improve Outcome: Progressing   Problem: Safety: Goal: Ability to remain free from injury will improve Outcome: Progressing   Problem: Skin Integrity: Goal: Risk for impaired skin integrity will decrease Outcome: Progressing

## 2019-04-02 NOTE — Progress Notes (Signed)
[facility]   Reason for visit: Follow up on bacteremia  Interval History: repeat blood cultures sent. WBC down to 13.1.  Remains afebrile.  No new complaints.  No associated rash or diarrhea.      Physical Exam: Constitutional:  Vitals:   04/02/19 0547 04/02/19 0746  BP:  (!) 116/91  Pulse:  89  Resp:    Temp: 99.5 F (37.5 C) 99.3 F (37.4 C)  SpO2: 99% 100%   patient appears in NAD Eyes: anicteric Respiratory: Normal respiratory effort; CTA B Cardiovascular: LVAD GI: soft, nt, nd  Review of Systems: Constitutional: negative for fevers and chills Gastrointestinal: negative for nausea and diarrhea Integument/breast: negative for rash  Lab Results  Component Value Date   WBC 13.1 (H) 04/02/2019   HGB 9.6 (L) 04/02/2019   HCT 31.1 (L) 04/02/2019   MCV 77.0 (L) 04/02/2019   PLT 227 04/02/2019    Lab Results  Component Value Date   CREATININE 1.04 04/02/2019   BUN 8 04/02/2019   NA 137 04/02/2019   K 3.5 04/02/2019   CL 100 04/02/2019   CO2 26 04/02/2019    Lab Results  Component Value Date   ALT 14 03/31/2019   AST 19 03/31/2019   ALKPHOS 132 (H) 03/31/2019     Microbiology: Recent Results (from the past 240 hour(s))  Culture, blood (Routine X 2) w Reflex to ID Panel     Status: Abnormal   Collection Time: 03/31/19  8:55 AM  Result Value Ref Range Status   Specimen Description BLOOD LEFT ANTECUBITAL  Final   Special Requests   Final    BOTTLES DRAWN AEROBIC AND ANAEROBIC Blood Culture results may not be optimal due to an inadequate volume of blood received in culture bottles   Culture  Setup Time   Final    ANAEROBIC BOTTLE ONLY GRAM POSITIVE COCCI CRITICAL RESULT CALLED TO, READ BACK BY AND VERIFIED WITHMerlene Morse [facility] 03/31/19 2344 JDW Performed at [facility], [address], [place], [place] [address]    Culture STAPHYLOCOCCUS AUREUS (A)  Final   Report Status 04/02/2019 FINAL  Final   Organism ID, Bacteria  STAPHYLOCOCCUS AUREUS  Final      Susceptibility   Staphylococcus aureus - MIC*    CIPROFLOXACIN 1 SENSITIVE Sensitive     ERYTHROMYCIN >=8 RESISTANT Resistant     GENTAMICIN <=0.5 SENSITIVE Sensitive     OXACILLIN <=0.25 SENSITIVE Sensitive     TETRACYCLINE <=1 SENSITIVE Sensitive     VANCOMYCIN 1 SENSITIVE Sensitive     TRIMETH/SULFA <=10 SENSITIVE Sensitive     CLINDAMYCIN RESISTANT Resistant     RIFAMPIN <=0.5 SENSITIVE Sensitive     Inducible Clindamycin POSITIVE Resistant     * STAPHYLOCOCCUS AUREUS  Blood Culture ID Panel (Reflexed)     Status: Abnormal   Collection Time: 03/31/19  8:55 AM  Result Value Ref Range Status   Enterococcus species NOT DETECTED NOT DETECTED Final   Listeria monocytogenes NOT DETECTED NOT DETECTED Final   Staphylococcus species DETECTED (A) NOT DETECTED Final    Comment: CRITICAL RESULT CALLED TO, READ BACK BY AND VERIFIED WITH: V BRYK PHARMD 03/31/19 2344 JDW    Staphylococcus aureus (BCID) DETECTED (A) NOT DETECTED Final    Comment: Methicillin (oxacillin) susceptible Staphylococcus aureus (MSSA). Preferred therapy is anti staphylococcal beta lactam antibiotic (Cefazolin or Nafcillin), unless clinically contraindicated. CRITICAL RESULT CALLED TO, READ BACK BY AND VERIFIED WITH: V BRYK PHARMD  03/31/19 2344 JDW    Methicillin resistance NOT DETECTED NOT DETECTED Final   Streptococcus species NOT DETECTED NOT DETECTED Final   Streptococcus agalactiae NOT DETECTED NOT DETECTED Final   Streptococcus pneumoniae NOT DETECTED NOT DETECTED Final   Streptococcus pyogenes NOT DETECTED NOT DETECTED Final   Acinetobacter baumannii NOT DETECTED NOT DETECTED Final   Enterobacteriaceae species NOT DETECTED NOT DETECTED Final   Enterobacter cloacae complex NOT DETECTED NOT DETECTED Final   Escherichia coli NOT DETECTED NOT DETECTED Final   Klebsiella oxytoca NOT DETECTED NOT DETECTED Final   Klebsiella pneumoniae NOT DETECTED NOT DETECTED Final   Proteus  species NOT DETECTED NOT DETECTED Final   Serratia marcescens NOT DETECTED NOT DETECTED Final   Haemophilus influenzae NOT DETECTED NOT DETECTED Final   Neisseria meningitidis NOT DETECTED NOT DETECTED Final   Pseudomonas aeruginosa NOT DETECTED NOT DETECTED Final   Candida albicans NOT DETECTED NOT DETECTED Final   Candida glabrata NOT DETECTED NOT DETECTED Final   Candida krusei NOT DETECTED NOT DETECTED Final   Candida parapsilosis NOT DETECTED NOT DETECTED Final   Candida tropicalis NOT DETECTED NOT DETECTED Final    Comment: Performed at [facility], [address] [place], [place] [address]  Culture, blood (Routine X 2) w Reflex to ID Panel     Status: Abnormal   Collection Time: 03/31/19  9:00 AM  Result Value Ref Range Status   Specimen Description BLOOD LEFT FOREARM  Final   Special Requests   Final    BOTTLES DRAWN AEROBIC AND ANAEROBIC Blood Culture adequate volume   Culture  Setup Time   Final    GRAM POSITIVE COCCI IN BOTH AEROBIC AND ANAEROBIC BOTTLES CRITICAL VALUE NOTED.  VALUE IS CONSISTENT WITH PREVIOUSLY REPORTED AND CALLED VALUE. Performed at [facility], [address] [place], [place] [address]    Culture STAPHYLOCOCCUS AUREUS (A)  Final   Report Status 04/02/2019 FINAL  Final  Aerobic Culture (superficial specimen)     Status: None (Preliminary result)   Collection Time: 03/31/19 10:08 AM  Result Value Ref Range Status   Specimen Description WOUND LVAD DRIVELINE SITE  Final   Special Requests NONE  Final   Gram Stain   Final    RARE WBC PRESENT,BOTH PMN AND MONONUCLEAR RARE GRAM POSITIVE COCCI    Culture   Final    CULTURE REINCUBATED FOR BETTER GROWTH Performed at [facility], [address], [place], [place] [address]    Report Status PENDING  Incomplete  MRSA PCR Screening     Status: None   Collection Time: 03/31/19 11:27 AM  Result Value Ref Range Status   MRSA by PCR NEGATIVE NEGATIVE Final    Comment:        The  GeneXpert MRSA Assay (FDA approved for NASAL specimens only), is one component of a comprehensive MRSA colonization surveillance program. It is not intended to diagnose MRSA infection nor to guide or monitor treatment for MRSA infections. Performed at [facility], [address] [place], [place] [address]   SARS Coronavirus 2 (CEPHEID - Performed in [facility] hospital lab), Hosp Order     Status: None   Collection Time: 03/31/19  6:05 PM  Result Value Ref Range Status   SARS Coronavirus 2 NEGATIVE NEGATIVE Final    Comment: (NOTE) If result is NEGATIVE SARS-CoV-2 target nucleic acids are NOT DETECTED. The SARS-CoV-2 RNA is generally detectable in upper and lower  respiratory specimens during  the acute phase of infection. The lowest  concentration of SARS-CoV-2 viral copies this assay can detect is 250  copies / mL. A negative result does not preclude SARS-CoV-2 infection  and should not be used as the sole basis for treatment or other  patient management decisions.  A negative result may occur with  improper specimen collection / handling, submission of specimen other  than nasopharyngeal swab, presence of viral mutation(s) within the  areas targeted by this assay, and inadequate number of viral copies  (<250 copies / mL). A negative result must be combined with clinical  observations, patient history, and epidemiological information. If result is POSITIVE SARS-CoV-2 target nucleic acids are DETECTED. The SARS-CoV-2 RNA is generally detectable in upper and lower  respiratory specimens dur ing the acute phase of infection.  Positive  results are indicative of active infection with SARS-CoV-2.  Clinical  correlation with patient history and other diagnostic information is  necessary to determine patient infection status.  Positive results do  not rule out bacterial infection or co-infection with other viruses. If result is PRESUMPTIVE POSTIVE SARS-CoV-2 nucleic acids  MAY BE PRESENT.   A presumptive positive result was obtained on the submitted specimen  and confirmed on repeat testing.  While 2019 novel coronavirus  (SARS-CoV-2) nucleic acids may be present in the submitted sample  additional confirmatory testing may be necessary for epidemiological  and / or clinical management purposes  to differentiate between  SARS-CoV-2 and other Sarbecovirus currently known to infect humans.  If clinically indicated additional testing with an alternate test  methodology [phone]) is advised. The SARS-CoV-2 RNA is generally  detectable in upper and lower respiratory sp ecimens during the acute  phase of infection. The expected result is Negative. Fact Sheet for Patients:  BoilerBrush.com.cy Fact Sheet for Healthcare Providers: https://pope.com/ This test is not yet approved or cleared by the [place] FDA and has been authorized for detection and/or diagnosis of SARS-CoV-2 by FDA under an Emergency Use Authorization (EUA).  This EUA will remain in effect (meaning this test can be used) for the duration of the COVID-19 declaration under Section 564(b)(1) of the Act, 21 U.S.C. section 360bbb-3(b)(1), unless the authorization is terminated or revoked sooner. Performed at [facility], [address] [place], [place] [address]     Impression/Plan:  1. Bacteremia - s/p mssa on cefazolin.  Will need 6 weeks of IV cefazolin through June 25th.  Will consider suppressive antibiotics after that.   Will continue to follow.    2.  Leukocytosis - improved today.  Will continue to monitor.  3.  Access - has midline.  Will need picc line after clearance of blood cultures, likely Monday.  Dr. Orvan Falconer on tomorrow.

## 2019-04-02 NOTE — Progress Notes (Signed)
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   HM III LVAD implanted on 08/03/18 by Dr. Laneta Simmers under Destination Therapy criteria.  Pt lying in bed resting. It good spirits today.  Vital signs: Temp: 98.1 HR: 88 Doppler Pressure:  86 Automatic BP:  80/50 (59) O2 Sat: 100% RA Wt: 175.4>176.6>176.5lbs    LVAD interrogation reveals:  Speed: 5700 Flow: 5.0 Power:  4.4 PI: 3.3 Alarms:  none Events:  13 today Hematocrit: 31 Fixed speed: 5700 Low speed limit: 5400  Drive Line:  Daily dressing changes using daily kits; pack silver strips in tunneling areas. Bedside nurse may change.  Changed by bedside nurse this morning.  Labs:  LDH trend: 148>129>163  INR trend: 2.1>2.2>2.1  WBC trend: 19.9>15.5>13.1   Anticoagulation Plan: -INR Goal: 2.0 - 2.5 -ASA Dose: 81 mg  Infection:  - 03/31/19 blood cultures x 2>>gm pos cocci  - 03/31/19 DL wound culture>>rare gram positive cocci    Plan/Recommendations:  1. Daily dressing changes using daily kits; pack tunneled areas with silver strips. Bedside nurse may change. 2. Call VAD Pager if any VAD equipment or drive line issues.  [name] RN, VAD Coordinator [phone]

## 2019-04-03 LAB — GLUCOSE, CAPILLARY
Glucose-Capillary: 123 mg/dL — ABNORMAL HIGH (ref 70–99)
Glucose-Capillary: 139 mg/dL — ABNORMAL HIGH (ref 70–99)
Glucose-Capillary: 127 mg/dL — ABNORMAL HIGH (ref 70–99)
Glucose-Capillary: 110 mg/dL — ABNORMAL HIGH (ref 70–99)
Glucose-Capillary: 94 mg/dL (ref 70–99)

## 2019-04-03 LAB — CBC WITH DIFFERENTIAL/PLATELET
Abs Immature Granulocytes: 0.13 10*3/uL — ABNORMAL HIGH (ref 0.00–0.07)
Basophils Absolute: 0.1 10*3/uL (ref 0.0–0.1)
Basophils Relative: 1 %
Eosinophils Absolute: 0.4 10*3/uL (ref 0.0–0.5)
Eosinophils Relative: 3 %
HCT: 29.1 % — ABNORMAL LOW (ref 39.0–52.0)
Hemoglobin: 9 g/dL — ABNORMAL LOW (ref 13.0–17.0)
Immature Granulocytes: 1 %
Lymphocytes Relative: 10 %
Lymphs Abs: 1.3 10*3/uL (ref 0.7–4.0)
MCH: 23.8 pg — ABNORMAL LOW (ref 26.0–34.0)
MCHC: 30.9 g/dL (ref 30.0–36.0)
MCV: 77 fL — ABNORMAL LOW (ref 80.0–100.0)
Monocytes Absolute: 2.5 10*3/uL — ABNORMAL HIGH (ref 0.1–1.0)
Monocytes Relative: 18 %
Neutro Abs: 9.2 10*3/uL — ABNORMAL HIGH (ref 1.7–7.7)
Neutrophils Relative %: 67 %
Platelets: 242 10*3/uL (ref 150–400)
RBC: 3.78 MIL/uL — ABNORMAL LOW (ref 4.22–5.81)
RDW: 16 % — ABNORMAL HIGH (ref 11.5–15.5)
WBC: 13.7 10*3/uL — ABNORMAL HIGH (ref 4.0–10.5)
nRBC: 0 % (ref 0.0–0.2)

## 2019-04-03 LAB — AEROBIC CULTURE W GRAM STAIN (SUPERFICIAL SPECIMEN)

## 2019-04-03 LAB — BASIC METABOLIC PANEL
Anion gap: 12 (ref 5–15)
BUN: 8 mg/dL (ref 6–20)
CO2: 25 mmol/L (ref 22–32)
Calcium: 8.8 mg/dL — ABNORMAL LOW (ref 8.9–10.3)
Chloride: 100 mmol/L (ref 98–111)
Creatinine, Ser: 0.96 mg/dL (ref 0.61–1.24)
GFR calc Af Amer: >60 mL/min (ref >60)
GFR calc non Af Amer: >60 mL/min (ref >60)
Glucose, Bld: 119 mg/dL — ABNORMAL HIGH (ref 70–99)
Potassium: 3.3 mmol/L — ABNORMAL LOW (ref 3.5–5.1)
Sodium: 137 mmol/L (ref 135–145)

## 2019-04-03 LAB — PROTIME-INR
INR: 2.1 — ABNORMAL HIGH (ref 0.8–1.2)
Prothrombin Time: 23.4 seconds — ABNORMAL HIGH (ref 11.4–15.2)

## 2019-04-03 LAB — LACTATE DEHYDROGENASE: LDH: 146 U/L (ref 98–192)

## 2019-04-03 MED ORDER — WARFARIN SODIUM 7.5 MG PO TABS
7.5000 mg | ORAL_TABLET | Freq: Once | ORAL | Status: AC
  Administered 2019-04-03: 7.5 mg via ORAL
  Filled 2019-04-03: qty 1

## 2019-04-03 MED ORDER — POTASSIUM CHLORIDE CRYS ER 20 MEQ PO TBCR
40.0000 meq | EXTENDED_RELEASE_TABLET | Freq: Once | ORAL | Status: AC
  Administered 2019-04-03: 40 meq via ORAL
  Filled 2019-04-03: qty 2

## 2019-04-03 MED ORDER — TRAZODONE HCL 50 MG PO TABS
50.0000 mg | ORAL_TABLET | Freq: Every evening | ORAL | Status: DC | PRN
  Administered 2019-04-03: 50 mg via ORAL
  Filled 2019-04-03: qty 1

## 2019-04-03 NOTE — Progress Notes (Signed)
Patient ID: Theodore Welch, male   DOB: [birth year], 58 y.o.   MRN: [id number]   Advanced Heart Failure VAD Team Note  PCP-Cardiologist: No primary care provider on file.   Subjective:    MSSA in blood and wound cultures, now on cefazolin. Ab pain improved. No fevers or chills. No drainage.   MAP stable at 80-90 overnight, he is off Entresto and amlodipine currently. Wants something for sleep.   CT chest/abdomen/pelvis did not show abscess at driveline site.    LVAD INTERROGATION:  HeartMate 3 LVAD:   Flow 4.9 liters/min, speed 5700, power 4.4, PI 2.7 VAD interrogated personally. Parameters stable. He has frequent PI events (chronic)  Objective:    Vital Signs:   Temp:  [97.9 F (36.6 C)-100.5 F (38.1 C)] 97.9 F (36.6 C) (05/16 0735) Pulse Rate:  [84-100] 84 (05/16 0735) Resp:  [17-26] 18 (05/16 0735) BP: (73-110)/(45-81) 100/81 (05/16 0735) SpO2:  [98 %-100 %] 98 % (05/16 0735) Weight:  [79.8 kg] 79.8 kg (05/16 0500) Last BM Date: 03/30/19 Mean arterial Pressure 80-90  Intake/Output:   Intake/Output Summary (Last 24 hours) at 04/03/2019 0841 Last data filed at 04/03/2019 0734 Gross per 24 hour  Intake 1400 ml  Output 500 ml  Net 900 ml     Physical Exam    General:  NAD.  HEENT: normal  Neck: supple. JVP not elevated.  Carotids 2+ bilat; no bruits. No lymphadenopathy or thryomegaly appreciated. Cor: LVAD hum.  Lungs: Clear. Abdomen: obese soft, nontender, non-distended. No hepatosplenomegaly. No bruits or masses. Good bowel sounds. Driveline site clean. Anchor in place.  Tender along driveline track in epigastric area but much less so than at admission. Extremities: no cyanosis, clubbing, rash. Warm no edema  Neuro: alert & oriented x 3. No focal deficits. Moves all 4 without problem     Telemetry   NSR 80s with occasional PVCs (personally reviewed)  Labs   Basic Metabolic Panel: Recent Labs  Lab 03/31/19 1106 04/01/19 0354 04/02/19 0500 04/03/19  0500  NA 133* 136 137 137  K 3.6 3.4* 3.5 3.3*  CL 97* 99 100 100  CO2 23 26 26 25   GLUCOSE 125* 114* 115* 119*  BUN 10 10 8 8   CREATININE 1.25* 1.11 1.04 0.96  CALCIUM 9.0 8.9 8.6* 8.8*    Liver Function Tests: Recent Labs  Lab 03/31/19 1106  AST 19  ALT 14  ALKPHOS 132*  BILITOT 1.2  PROT 7.3  ALBUMIN 3.1*   No results for input(s): LIPASE, AMYLASE in the last 168 hours. No results for input(s): AMMONIA in the last 168 hours.  CBC: Recent Labs  Lab 03/31/19 0832 04/01/19 0354 04/02/19 0500 04/03/19 0500  WBC 19.9* 15.5* 13.1* 13.7*  NEUTROABS  --  11.0* 9.0* 9.2*  HGB 11.5* 9.5* 9.6* 9.0*  HCT 39.2 30.6* 31.1* 29.1*  MCV 81.5 77.3* 77.0* 77.0*  PLT 232 196 227 242    INR: Recent Labs  Lab 03/31/19 0819 03/31/19 1926 04/01/19 0354 04/02/19 0500 04/03/19 0500  INR 1.9* 2.1* 2.2* 2.1* 2.1*    Other results:  EKG:    Imaging   No results found.   Medications:     Scheduled Medications: . aspirin EC  81 mg Oral Daily  . atorvastatin  20 mg Oral q1800  . carbamide peroxide  5 drop Both EARS TID  . DULoxetine  60 mg Oral Daily  . gabapentin  600 mg Oral TID  . insulin aspart  0-15 Units Subcutaneous  TID WC  . insulin aspart  0-5 Units Subcutaneous QHS  . pantoprazole  40 mg Oral Daily  . sodium chloride flush  10-40 mL Intracatheter Q12H  . spironolactone  25 mg Oral Daily  . thiamine  100 mg Oral Daily  . vitamin B-12  1,000 mcg Oral Daily  . Warfarin - Pharmacist Dosing Inpatient   Does not apply q1800  . zinc sulfate  220 mg Oral Daily    Infusions: . sodium chloride 250 mL (04/01/19 1435)  .  ceFAZolin (ANCEF) IV 2 g (04/03/19 0517)    PRN Medications: sodium chloride, acetaminophen, docusate sodium, fluticasone, hydrOXYzine, ipratropium-albuterol, methocarbamol, nicotine, nystatin, ondansetron (ZOFRAN) IV, oxyCODONE, polyethylene glycol, sodium chloride flush, traMADol, traZODone   Assessment/Plan:    1. Driveline infection:  MSSA driveline infection in 1/20.  Completed course of doxycycline as outpatient 4/22 for driveline infection. Admitted with upper abdominal tenderness and increased driveline drainage, WBCs 19.9 => 15.5 => 13 => 13.7 today.  CT chest/abd/pelvis did not show abscess. Now with MSSA in blood and wound cultures again.  - He is on cefazolin, will continue 6 wks.  Per ID, do not absolutely need TEE at this point unless there are additional complications.  He will need a suppressive antibiotic po after the 6 wks of cefazolin.  - For now, no plan for debridement.   - Will need midline exchanged for PICC if 2nd set of cultures remain negative (hopefully Monday).   2. 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 LVAD parameters stable except for frequent PI events (no low flows). This is chronic for him. MAP 86. He is not volume overloaded.  LDH 146, INR 2.1 today. Discussed dosing with PharmD personally. - He does not need Lasix.  - Entresto and amlodipine were held with infection/concern for sepsis, MAP in 80s without them currently. He remains on spironolactone.   - Continue ASA 81 daily.  - Can continue warfarin for now given no plans for I&D at this point.  - Should be transplant candidate eventually if he can stay off cigarettes,he has almost quit. 3. Smoking: Knows he needs to quit to be a transplant candidate and working on it.  4. RLE DVT: On warfarin for LVAD.  5. OSA: Continue CPAP.  5. Hyperlipidemia: Continue atorvastatin.  6. Type II diabetes: SSI while inpatient.  7. Insomnia: Trazodone prn  I reviewed the LVAD parameters from today, and compared the results to the patient's prior recorded data.  No programming changes were made.  The LVAD is functioning within specified parameters.  The patient performs LVAD self-test daily.  LVAD interrogation was negative for any significant power changes, alarms or PI events/speed drops.  LVAD equipment check  completed and is in good working order.  Back-up equipment present.   LVAD education done on emergency procedures and precautions and reviewed exit site care.  Length of Stay: 3  Arvilla Meres, MD 04/03/2019, 8:41 AM  VAD Team --- VAD ISSUES ONLY--- Pager [phone] (7am - 7am)  Advanced Heart Failure Team  Pager [phone] (M-F; 7a - 4p)  Please contact [facility] for night-coverage after hours (4p -7a ) and weekends on [email or web address]

## 2019-04-03 NOTE — Progress Notes (Signed)
ANTICOAGULATION CONSULT NOTE - Follow Up Consult  Pharmacy Consult for Coumadin Indication: LVAD  No Known Allergies  Patient Measurements: Height: 5\' 10"  (177.8 cm) Weight: 175 lb 14.4 oz (79.8 kg) IBW/kg (Calculated) : 73   Vital Signs: Temp: 97.9 F (36.6 C) (05/16 0735) Temp Source: Oral (05/16 0735) BP: 100/81 (05/16 0735) Pulse Rate: 84 (05/16 0735)  Labs: Recent Labs    04/01/19 0354 04/02/19 0500 04/03/19 0500  HGB 9.5* 9.6* 9.0*  HCT 30.6* 31.1* 29.1*  PLT 196 227 242  LABPROT 23.8* 23.4* 23.4*  INR 2.2* 2.1* 2.1*  CREATININE 1.11 1.04 0.96    Estimated Creatinine Clearance: 84.5 mL/min (by C-G formula based on SCr of 0.96 mg/dL).  Assessment: Theodore Welch is a 58 y.o. male admitted on 03/31/19 with LVAD driveline infection. Pharmacy has been consulted for heparin dosing.  INR continues to be within goal today at 2.1. hgb trending down to 9, LDH stable. No s/sx of bleeding.  PTA regimen 5 mg daily except 7.5 on T/Sat  Goal of Therapy:  INR 2-2.5 Monitor platelets by anticoagulation protocol: Yes   Plan:  1. Coumadin 7.5 mg x 1 tonight per home regimen 2. Daily INR.  Sheppard Coil PharmD., BCPS Clinical Pharmacist 04/03/2019 9:13 AM

## 2019-04-03 NOTE — Progress Notes (Signed)
  Subjective: Patient continues to feel better Epigastric abdominal tenderness and low back pain significantly improved Last set of blood cultures negative VAD flow and function satisfactory Power cord exit site dressing change daily with 1 cm space packed with silver strips Objective: Vital signs in last 24 hours: Temp:  [97.9 F (36.6 C)-100.5 F (38.1 C)] 97.9 F (36.6 C) (05/16 0735) Pulse Rate:  [84-100] 84 (05/16 0735) Cardiac Rhythm: Normal sinus rhythm (05/16 0815) Resp:  [17-26] 18 (05/16 0735) BP: (73-110)/(45-81) 100/81 (05/16 0735) SpO2:  [98 %-100 %] 98 % (05/16 0735) Weight:  [79.8 kg] 79.8 kg (05/16 0500)  Hemodynamic parameters for last 24 hours:    Intake/Output from previous day: 05/15 0701 - 05/16 0700 In: 1160 [P.O.:960; IV Piggyback:200] Out: 0  Intake/Output this shift: Total I/O In: 240 [P.O.:240] Out: 500 [Urine:500]  Alert and comfortable Lungs clear Normal VAD hum Extremities warm Abdominal dressing clean and dry  Lab Results: Recent Labs    04/02/19 0500 04/03/19 0500  WBC 13.1* 13.7*  HGB 9.6* 9.0*  HCT 31.1* 29.1*  PLT 227 242   BMET:  Recent Labs    04/02/19 0500 04/03/19 0500  NA 137 137  K 3.5 3.3*  CL 100 100  CO2 26 25  GLUCOSE 115* 119*  BUN 8 8  CREATININE 1.04 0.96  CALCIUM 8.6* 8.8*    PT/INR:  Recent Labs    04/03/19 0500  LABPROT 23.4*  INR 2.1*   ABG    Component Value Date/Time   PHART 7.473 (H) 08/05/2018 0345   HCO3 26.9 08/05/2018 0345   TCO2 26 08/04/2018 1622   O2SAT 73.6 08/12/2018 0345   CBG (last 3)  Recent Labs    04/02/19 1615 04/02/19 2109 04/03/19 0609  GLUCAP 161* 123* 139*    Assessment/Plan: S/P  Continue antibiotics and daily dressing changes PICC line to be placed for home IV antibiotics  LOS: 3 days    [name] 04/03/2019

## 2019-04-04 LAB — GLUCOSE, CAPILLARY
Glucose-Capillary: 122 mg/dL — ABNORMAL HIGH (ref 70–99)
Glucose-Capillary: 82 mg/dL (ref 70–99)
Glucose-Capillary: 84 mg/dL (ref 70–99)
Glucose-Capillary: 103 mg/dL — ABNORMAL HIGH (ref 70–99)

## 2019-04-04 LAB — CBC WITH DIFFERENTIAL/PLATELET
Abs Immature Granulocytes: 0.16 10*3/uL — ABNORMAL HIGH (ref 0.00–0.07)
Basophils Absolute: 0.1 10*3/uL (ref 0.0–0.1)
Basophils Relative: 1 %
Eosinophils Absolute: 0.3 10*3/uL (ref 0.0–0.5)
Eosinophils Relative: 3 %
HCT: 29.2 % — ABNORMAL LOW (ref 39.0–52.0)
Hemoglobin: 8.9 g/dL — ABNORMAL LOW (ref 13.0–17.0)
Immature Granulocytes: 1 %
Lymphocytes Relative: 9 %
Lymphs Abs: 1.2 10*3/uL (ref 0.7–4.0)
MCH: 23.8 pg — ABNORMAL LOW (ref 26.0–34.0)
MCHC: 30.5 g/dL (ref 30.0–36.0)
MCV: 78.1 fL — ABNORMAL LOW (ref 80.0–100.0)
Monocytes Absolute: 2.2 10*3/uL — ABNORMAL HIGH (ref 0.1–1.0)
Monocytes Relative: 17 %
Neutro Abs: 9.2 10*3/uL — ABNORMAL HIGH (ref 1.7–7.7)
Neutrophils Relative %: 69 %
Platelets: 296 10*3/uL (ref 150–400)
RBC: 3.74 MIL/uL — ABNORMAL LOW (ref 4.22–5.81)
RDW: 16.1 % — ABNORMAL HIGH (ref 11.5–15.5)
WBC: 13.2 10*3/uL — ABNORMAL HIGH (ref 4.0–10.5)
nRBC: 0 % (ref 0.0–0.2)

## 2019-04-04 LAB — BASIC METABOLIC PANEL
Anion gap: 12 (ref 5–15)
BUN: 5 mg/dL — ABNORMAL LOW (ref 6–20)
CO2: 25 mmol/L (ref 22–32)
Calcium: 8.6 mg/dL — ABNORMAL LOW (ref 8.9–10.3)
Chloride: 101 mmol/L (ref 98–111)
Creatinine, Ser: 0.95 mg/dL (ref 0.61–1.24)
GFR calc Af Amer: >60 mL/min (ref >60)
GFR calc non Af Amer: >60 mL/min (ref >60)
Glucose, Bld: 111 mg/dL — ABNORMAL HIGH (ref 70–99)
Potassium: 3.6 mmol/L (ref 3.5–5.1)
Sodium: 138 mmol/L (ref 135–145)

## 2019-04-04 LAB — LACTATE DEHYDROGENASE: LDH: 163 U/L (ref 98–192)

## 2019-04-04 LAB — PROTIME-INR
INR: 2.3 — ABNORMAL HIGH (ref 0.8–1.2)
Prothrombin Time: 24.7 seconds — ABNORMAL HIGH (ref 11.4–15.2)

## 2019-04-04 MED ORDER — TEMAZEPAM 15 MG PO CAPS
30.0000 mg | ORAL_CAPSULE | Freq: Every day | ORAL | Status: DC
  Administered 2019-04-04: 30 mg via ORAL
  Filled 2019-04-04: qty 2

## 2019-04-04 MED ORDER — WARFARIN SODIUM 5 MG PO TABS
5.0000 mg | ORAL_TABLET | Freq: Once | ORAL | Status: AC
  Administered 2019-04-04: 5 mg via ORAL
  Filled 2019-04-04: qty 1

## 2019-04-04 MED ORDER — POTASSIUM CHLORIDE CRYS ER 20 MEQ PO TBCR
40.0000 meq | EXTENDED_RELEASE_TABLET | Freq: Once | ORAL | Status: AC
  Administered 2019-04-04: 40 meq via ORAL
  Filled 2019-04-04: qty 2

## 2019-04-04 MED ORDER — SACUBITRIL-VALSARTAN 24-26 MG PO TABS
1.0000 | ORAL_TABLET | Freq: Two times a day (BID) | ORAL | Status: DC
  Administered 2019-04-04 – 2019-04-05 (×3): 1 via ORAL
  Filled 2019-04-04 (×3): qty 1

## 2019-04-04 NOTE — Progress Notes (Signed)
ANTICOAGULATION CONSULT NOTE - Follow Up Consult  Pharmacy Consult for Coumadin Indication: LVAD  No Known Allergies  Patient Measurements: Height: 5\' 10"  (177.8 cm) Weight: 178 lb 2.1 oz (80.8 kg)(scale A) IBW/kg (Calculated) : 73   Vital Signs: Temp: 99.7 F (37.6 C) (05/17 0900) Temp Source: Oral (05/17 0900) BP: 95/73 (05/17 0900) Pulse Rate: 102 (05/17 0332)  Labs: Recent Labs    04/02/19 0500 04/03/19 0500 04/04/19 0720  HGB 9.6* 9.0* 8.9*  HCT 31.1* 29.1* 29.2*  PLT 227 242 296  LABPROT 23.4* 23.4* 24.7*  INR 2.1* 2.1* 2.3*  CREATININE 1.04 0.96 0.95    Estimated Creatinine Clearance: 85.4 mL/min (by C-G formula based on SCr of 0.95 mg/dL).  Assessment: Theodore Welch is a 58 y.o. male admitted on 03/31/19 with LVAD driveline infection. Pharmacy has been consulted for heparin dosing.  INR continues to be within goal today at 2.3. hgb trending down to 8.9, LDH stable. No s/sx of bleeding.  PTA regimen 5 mg daily except 7.5 on T/Sat  Goal of Therapy:  INR 2-2.5 Monitor platelets by anticoagulation protocol: Yes   Plan:  1. Coumadin 5 mg tonight per home regimen 2. Daily INR.  Sheppard Coil PharmD., BCPS Clinical Pharmacist 04/04/2019 10:11 AM

## 2019-04-04 NOTE — Progress Notes (Signed)
Patient ID: SALVADORE Welch, male   DOB: [birth year], 58 y.o.   MRN: [id number]   Advanced Heart Failure VAD Team Note  PCP-Cardiologist: No primary care provider on file.   Subjective:    MSSA in blood and wound cultures, now on cefazolin. Ab pain improved. No fevers or chills. No drainage.  Main complaint is insomnia.   MAPs in 80-90s off Entresto and amlodipine.    CT chest/abdomen/pelvis did not show abscess at driveline site.    LVAD INTERROGATION:  HeartMate 3 LVAD:   Flow 4.8 liters/min, speed 5700, power 4.5, PI 3.0 VAD interrogated personally. Parameters stable. Occasional PI events (chronic)  Objective:    Vital Signs:   Temp:  [97.9 F (36.6 C)-100.9 F (38.3 C)] 99.7 F (37.6 C) (05/17 1112) Pulse Rate:  [45-102] 89 (05/17 1112) Resp:  [20-26] 20 (05/17 1112) BP: (86-113)/(68-94) 113/83 (05/17 1112) SpO2:  [98 %-100 %] 98 % (05/17 1112) Weight:  [80.8 kg] 80.8 kg (05/17 0332) Last BM Date: 04/03/19 Mean arterial Pressure 80-90  Intake/Output:   Intake/Output Summary (Last 24 hours) at 04/04/2019 1113 Last data filed at 04/04/2019 0400 Gross per 24 hour  Intake 562 ml  Output 0 ml  Net 562 ml     Physical Exam    General:  NAD.  HEENT: normal  Neck: supple. JVP not elevated.  Carotids 2+ bilat; no bruits. No lymphadenopathy or thryomegaly appreciated. Cor: LVAD hum.  Lungs: Clear. Abdomen: obese soft, nontender, non-distended. No hepatosplenomegaly. No bruits or masses. Good bowel sounds. Driveline site clean. Anchor in place.  Extremities: no cyanosis, clubbing, rash. Warm no edema  Neuro: alert & oriented x 3. No focal deficits. Moves all 4 without problem     Telemetry   NSR 80s with occasional PVCs (personally reviewed)  Labs   Basic Metabolic Panel: Recent Labs  Lab 03/31/19 1106 04/01/19 0354 04/02/19 0500 04/03/19 0500 04/04/19 0720  NA 133* 136 137 137 138  K 3.6 3.4* 3.5 3.3* 3.6  CL 97* 99 100 100 101  CO2 23 26 26 25 25    GLUCOSE 125* 114* 115* 119* 111*  BUN 10 10 8 8  5*  CREATININE 1.25* 1.11 1.04 0.96 0.95  CALCIUM 9.0 8.9 8.6* 8.8* 8.6*    Liver Function Tests: Recent Labs  Lab 03/31/19 1106  AST 19  ALT 14  ALKPHOS 132*  BILITOT 1.2  PROT 7.3  ALBUMIN 3.1*   No results for input(s): LIPASE, AMYLASE in the last 168 hours. No results for input(s): AMMONIA in the last 168 hours.  CBC: Recent Labs  Lab 03/31/19 0832 04/01/19 0354 04/02/19 0500 04/03/19 0500 04/04/19 0720  WBC 19.9* 15.5* 13.1* 13.7* 13.2*  NEUTROABS  --  11.0* 9.0* 9.2* 9.2*  HGB 11.5* 9.5* 9.6* 9.0* 8.9*  HCT 39.2 30.6* 31.1* 29.1* 29.2*  MCV 81.5 77.3* 77.0* 77.0* 78.1*  PLT 232 196 227 242 296    INR: Recent Labs  Lab 03/31/19 1926 04/01/19 0354 04/02/19 0500 04/03/19 0500 04/04/19 0720  INR 2.1* 2.2* 2.1* 2.1* 2.3*    Other results:      Imaging   No results found.   Medications:     Scheduled Medications: . aspirin EC  81 mg Oral Daily  . atorvastatin  20 mg Oral q1800  . carbamide peroxide  5 drop Both EARS TID  . DULoxetine  60 mg Oral Daily  . gabapentin  600 mg Oral TID  . insulin aspart  0-15 Units Subcutaneous TID  WC  . insulin aspart  0-5 Units Subcutaneous QHS  . pantoprazole  40 mg Oral Daily  . potassium chloride  40 mEq Oral Once  . sodium chloride flush  10-40 mL Intracatheter Q12H  . spironolactone  25 mg Oral Daily  . temazepam  30 mg Oral QHS  . thiamine  100 mg Oral Daily  . vitamin B-12  1,000 mcg Oral Daily  . warfarin  5 mg Oral ONCE-1800  . Warfarin - Pharmacist Dosing Inpatient   Does not apply q1800  . zinc sulfate  220 mg Oral Daily    Infusions: . sodium chloride 250 mL (04/01/19 1435)  .  ceFAZolin (ANCEF) IV 2 g (04/04/19 0616)    PRN Medications: sodium chloride, acetaminophen, docusate sodium, fluticasone, hydrOXYzine, ipratropium-albuterol, methocarbamol, nicotine, nystatin, ondansetron (ZOFRAN) IV, oxyCODONE, polyethylene glycol, sodium chloride  flush, traMADol, traZODone   Assessment/Plan:    1. Driveline infection: MSSA driveline infection in 1/20.  Completed course of doxycycline as outpatient 4/22 for driveline infection. Admitted with upper abdominal tenderness and increased driveline drainage, WBCs 19.9 => 15.5 => 13 => 13.7 => 13.2 today.  CT chest/abd/pelvis did not show abscess. Now with MSSA in blood and wound cultures again.  - He is on cefazolin, will continue 6 wks.  Per ID, do not absolutely need TEE at this point unless there are additional complications.  He will need a suppressive antibiotic po after the 6 wks of cefazolin.  - For now, no plan for debridement.   - Will need midline exchanged for PICC if 2nd set of cultures remain negative (likely tomorrow).   - D/W Dr. Donata Clay at bedside  2. 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 LVAD parameters stable except for frequent PI events (no low flows). This is chronic for him. MAP 86. He is not volume overloaded.  LDH 163, INR 2.3 today. Discussed dosing with PharmD personally. - He does not need Lasix.  - Entresto and amlodipine were held with infection/concern for sepsis, MAP in 80s without them currently. He remains on spironolactone.   - Will restart low-dose Entresto today - Continue ASA 81 daily.  - Can continue warfarin for now given no plans for I&D at this point. INR 2.3. Personally reviewed - Should be transplant candidate eventually if he can stay off cigarettes,he has almost quit. 3. Smoking: Knows he needs to quit to be a transplant candidate and working on it.  4. RLE DVT: On warfarin for LVAD.  5. OSA: Continue CPAP.  5. Hyperlipidemia: Continue atorvastatin.  6. Type II diabetes: SSI while inpatient.  7. Insomnia: Will give temazepam  I reviewed the LVAD parameters from today, and compared the results to the patient's prior recorded data.  No programming changes were made.  The LVAD is functioning  within specified parameters.  The patient performs LVAD self-test daily.  LVAD interrogation was negative for any significant power changes, alarms or PI events/speed drops.  LVAD equipment check completed and is in good working order.  Back-up equipment present.   LVAD education done on emergency procedures and precautions and reviewed exit site care.  Length of Stay: 4  Arvilla Meres, MD 04/04/2019, 11:13 AM  VAD Team --- VAD ISSUES ONLY--- Pager [phone] (7am - 7am)  Advanced Heart Failure Team  Pager [phone] (M-F; 7a - 4p)  Please contact [facility] for night-coverage after hours (4p -7a ) and weekends on [email or web address]

## 2019-04-05 ENCOUNTER — Inpatient Hospital Stay: Payer: Self-pay

## 2019-04-05 DIAGNOSIS — Z978 Presence of other specified devices: Secondary | ICD-10-CM

## 2019-04-05 LAB — BASIC METABOLIC PANEL
Anion gap: 12 (ref 5–15)
BUN: 6 mg/dL (ref 6–20)
CO2: 24 mmol/L (ref 22–32)
Calcium: 9.1 mg/dL (ref 8.9–10.3)
Chloride: 103 mmol/L (ref 98–111)
Creatinine, Ser: 1.03 mg/dL (ref 0.61–1.24)
GFR calc Af Amer: >60 mL/min (ref >60)
GFR calc non Af Amer: >60 mL/min (ref >60)
Glucose, Bld: 125 mg/dL — ABNORMAL HIGH (ref 70–99)
Potassium: 4.1 mmol/L (ref 3.5–5.1)
Sodium: 139 mmol/L (ref 135–145)

## 2019-04-05 LAB — CBC WITH DIFFERENTIAL/PLATELET
Abs Immature Granulocytes: 0.25 10*3/uL — ABNORMAL HIGH (ref 0.00–0.07)
Basophils Absolute: 0.1 10*3/uL (ref 0.0–0.1)
Basophils Relative: 1 %
Eosinophils Absolute: 0.4 10*3/uL (ref 0.0–0.5)
Eosinophils Relative: 3 %
HCT: 33 % — ABNORMAL LOW (ref 39.0–52.0)
Hemoglobin: 9.9 g/dL — ABNORMAL LOW (ref 13.0–17.0)
Immature Granulocytes: 2 %
Lymphocytes Relative: 12 %
Lymphs Abs: 1.8 10*3/uL (ref 0.7–4.0)
MCH: 23.6 pg — ABNORMAL LOW (ref 26.0–34.0)
MCHC: 30 g/dL (ref 30.0–36.0)
MCV: 78.8 fL — ABNORMAL LOW (ref 80.0–100.0)
Monocytes Absolute: 2.3 10*3/uL — ABNORMAL HIGH (ref 0.1–1.0)
Monocytes Relative: 15 %
Neutro Abs: 10.2 10*3/uL — ABNORMAL HIGH (ref 1.7–7.7)
Neutrophils Relative %: 67 %
Platelets: 348 10*3/uL (ref 150–400)
RBC: 4.19 MIL/uL — ABNORMAL LOW (ref 4.22–5.81)
RDW: 16.3 % — ABNORMAL HIGH (ref 11.5–15.5)
WBC: 15.1 10*3/uL — ABNORMAL HIGH (ref 4.0–10.5)
nRBC: 0 % (ref 0.0–0.2)

## 2019-04-05 LAB — GLUCOSE, CAPILLARY
Glucose-Capillary: 116 mg/dL — ABNORMAL HIGH (ref 70–99)
Glucose-Capillary: 110 mg/dL — ABNORMAL HIGH (ref 70–99)
Glucose-Capillary: 81 mg/dL (ref 70–99)

## 2019-04-05 LAB — LACTATE DEHYDROGENASE: LDH: 174 U/L (ref 98–192)

## 2019-04-05 LAB — PROTIME-INR
INR: 2.2 — ABNORMAL HIGH (ref 0.8–1.2)
Prothrombin Time: 24 seconds — ABNORMAL HIGH (ref 11.4–15.2)

## 2019-04-05 MED ORDER — HEPARIN SOD (PORK) LOCK FLUSH 100 UNIT/ML IV SOLN
250.0000 [IU] | INTRAVENOUS | Status: AC | PRN
  Administered 2019-04-05: 250 [IU]

## 2019-04-05 MED ORDER — SODIUM CHLORIDE 0.9% FLUSH: 10.0000 mL | INTRAVENOUS | Status: DC | PRN

## 2019-04-05 MED ORDER — WARFARIN SODIUM 7.5 MG PO TABS: 7.5000 mg | ORAL_TABLET | ORAL | Status: DC

## 2019-04-05 MED ORDER — SACUBITRIL-VALSARTAN 49-51 MG PO TABS
1.0000 | ORAL_TABLET | Freq: Two times a day (BID) | ORAL | Status: DC
  Filled 2019-04-05: qty 1

## 2019-04-05 MED ORDER — CEFAZOLIN IV (FOR PTA / DISCHARGE USE ONLY): 2.0000 g | Freq: Three times a day (TID) | INTRAVENOUS | 0 refills | Status: AC

## 2019-04-05 MED ORDER — THIAMINE HCL 100 MG PO TABS: 100.0000 mg | ORAL_TABLET | Freq: Every day | ORAL | 0 refills | Status: AC

## 2019-04-05 MED ORDER — WARFARIN SODIUM 5 MG PO TABS
5.0000 mg | ORAL_TABLET | ORAL | Status: DC
  Administered 2019-04-05: 5 mg via ORAL
  Filled 2019-04-05: qty 1

## 2019-04-05 MED ORDER — CEFAZOLIN IV (FOR PTA / DISCHARGE USE ONLY): 2.0000 g | Freq: Three times a day (TID) | INTRAVENOUS | 0 refills | Status: DC

## 2019-04-05 NOTE — Care Management Important Message (Signed)
Important Message  Patient Details  Name: Theodore Welch MRN: [id number] Date of Birth: [birth year]   Medicare Important Message Given:  Yes    Adela Esteban Stefan Church 04/05/2019, 2:47 PM

## 2019-04-05 NOTE — Progress Notes (Signed)
[facility] for Infectious Disease  Date of Admission:  03/31/2019       Total days of antibiotics 6  Day 5 Cefazolin         ASSESSMENT: Theodore Welch is a very nice 58 y.o. male with LVAD related exitsite infection with associated blood stream infection due to MSSA in 3/4 culture bottles. CT scan revealed no drainable abscess that would require surgery during current admission. His epigastric and abdominal wall tenderness have resolved. Drainage from exit site has resolved based on yesterday's dressing change. He is walking around doing well and ready to discharge.  He has has continued to have some intermittent fevers 100.1 - 100.9 F, of which he is asymptomatic for, with ongoing leukocytosis 15.1K throughout the weekend. Blood cultures drawn on 5/15 remain no growth. His current midline catheter was placed during bacteremia and may be a potential contributor to this. Would remove his midline catheter and replace with single lumen PICC line today. Will treat with 6 weeks IV cefazolin from today with chronic suppressive therapy (keflex TID vs doxy bid) for presumed pump involvement in the setting of staph aureus bacteremia. He is comfortable with this plan. Will have him follow up with either Dr. Megan Salon or myself in ID clinic at the end of therapy. OPAT defined below.    PLAN: 1. Please remove midline catheter.  2. Place PICC line for long term access for OPAT 3. Wound care per VAD team   OPAT ORDERS:  Diagnosis: Bacteremia in LVAD patient with exitsite infection   Culture Result: MSSA   No Known Allergies  Discharge antibiotics: Cefazolin 2 gm IV TID   Duration: 6 weeks   End Date: June 28th   [place] and Maintenance Per Protocol _x_ Please pull PIC at completion of IV antibiotics __ Please leave PIC in place until doctor has seen patient or been notified  Labs weekly while on IV antibiotics: _x_ CBC with differential _x_ BMP __ BMP TWICE WEEKLY** __ CMP  __ CRP __ ESR __ Vancomycin trough  Fax weekly labs to [phone]  Clinic Follow Up Appt: June 23rd @ 9:00 am with Colletta [place]     Principal Problem:   MSSA bacteremia Active Problems:   Infection associated with driveline of left ventricular assist device (LVAD) ([place])   Type 2 diabetes mellitus ([place])   . aspirin EC  81 mg Oral Daily  . atorvastatin  20 mg Oral q1800  . carbamide peroxide  5 drop Both EARS TID  . DULoxetine  60 mg Oral Daily  . gabapentin  600 mg Oral TID  . insulin aspart  0-15 Units Subcutaneous TID WC  . insulin aspart  0-5 Units Subcutaneous QHS  . pantoprazole  40 mg Oral Daily  . sacubitril-valsartan  1 tablet Oral BID  . sodium chloride flush  10-40 mL Intracatheter Q12H  . spironolactone  25 mg Oral Daily  . temazepam  30 mg Oral QHS  . thiamine  100 mg Oral Daily  . vitamin B-12  1,000 mcg Oral Daily  . warfarin  5 mg Oral Once per day on Sun Mon Wed Thu Fri  . [START ON 04/06/2019] warfarin  7.5 mg Oral Once per day on Tue Sat  . Warfarin - Pharmacist Dosing Inpatient   Does not apply q1800  . zinc sulfate  220 mg Oral Daily    SUBJECTIVE: Feeling much better. "Ready to go home." Abdominal tenderness has resolved both over  epigastric space and DL site. He is unaware he has had some low-grade temperatures over the weekend and has no new symptoms or concerns to report. Appetitie better. Getting around walking easily.   Review of Systems: Review of Systems  Constitutional: Negative for chills, fever and malaise/fatigue.  Respiratory: Negative for cough and shortness of breath.   Cardiovascular: Negative for chest pain.  Gastrointestinal: Negative for abdominal pain, nausea and vomiting.  Musculoskeletal: Negative for back pain, joint pain and neck pain.  Neurological: Negative for dizziness and headaches.    No Known Allergies  OBJECTIVE: Vitals:   04/04/19 1930 04/04/19 2320 04/05/19 0346 04/05/19 0745  BP: 121/76 (!) 88/60 96/83 (!)  103/91  Pulse: 98 94 94 92  Resp: (!) 27 (!) 34 (!) 27 (!) 26  Temp: 98.6 F (37 C) 99.4 F (37.4 C) 98.5 F (36.9 C) 98.8 F (37.1 C)  TempSrc: Oral Oral Oral Oral  SpO2: 99%  100% 99%  Weight:   84.1 kg   Height:       Body mass index is 26.6 kg/m.  Physical Exam Vitals signs reviewed.  Constitutional:      Comments: Seated comfortably in recliner. Pleasant. Breathing comfortably.   HENT:     Mouth/Throat:     Mouth: Mucous membranes are moist.     Pharynx: Oropharynx is clear.  Eyes:     General: No scleral icterus. Cardiovascular:     Rate and Rhythm: Normal rate and regular rhythm.     Comments: LVAD hum+ with native S1/S2 auscultated  Pulmonary:     Effort: Pulmonary effort is normal.     Breath sounds: Normal breath sounds.  Abdominal:     General: Bowel sounds are normal.     Palpations: Abdomen is soft.     Tenderness: There is no abdominal tenderness.     Comments: Exit site dressing intact w/o drainage. Due to be changed soon.   Skin:    General: Skin is warm.     Capillary Refill: Capillary refill takes less than 2 seconds.     Comments: LUE Midline dressing coming off. Otherwise w/o tenderness, cording, drainage. Biopatch in place.   Neurological:     Mental Status: He is oriented to person, place, and time.     Lab Results Lab Results  Component Value Date   WBC 15.1 (H) 04/05/2019   HGB 9.9 (L) 04/05/2019   HCT 33.0 (L) 04/05/2019   MCV 78.8 (L) 04/05/2019   PLT 348 04/05/2019    Lab Results  Component Value Date   CREATININE 1.03 04/05/2019   BUN 6 04/05/2019   NA 139 04/05/2019   K 4.1 04/05/2019   CL 103 04/05/2019   CO2 24 04/05/2019    Lab Results  Component Value Date   ALT 14 03/31/2019   AST 19 03/31/2019   ALKPHOS 132 (H) 03/31/2019   BILITOT 1.2 03/31/2019     Microbiology: Recent Results (from the past 240 hour(s))  Culture, blood (Routine X 2) w Reflex to ID Panel     Status: Abnormal   Collection Time: 03/31/19   8:55 AM  Result Value Ref Range Status   Specimen Description BLOOD LEFT ANTECUBITAL  Final   Special Requests   Final    BOTTLES DRAWN AEROBIC AND ANAEROBIC Blood Culture results may not be optimal due to an inadequate volume of blood received in culture bottles   Culture  Setup Time   Final    ANAEROBIC BOTTLE ONLY  GRAM POSITIVE COCCI CRITICAL RESULT CALLED TO, READ BACK BY AND VERIFIED WITHAlona Bene [facility] 03/31/19 2344 JDW Performed at [facility], [place] [address], [place], [place] [address]    Culture STAPHYLOCOCCUS AUREUS (A)  Final   Report Status 04/02/2019 FINAL  Final   Organism ID, Bacteria STAPHYLOCOCCUS AUREUS  Final      Susceptibility   Staphylococcus aureus - MIC*    CIPROFLOXACIN 1 SENSITIVE Sensitive     ERYTHROMYCIN >=8 RESISTANT Resistant     GENTAMICIN <=0.5 SENSITIVE Sensitive     OXACILLIN <=0.25 SENSITIVE Sensitive     TETRACYCLINE <=1 SENSITIVE Sensitive     VANCOMYCIN 1 SENSITIVE Sensitive     TRIMETH/SULFA <=10 SENSITIVE Sensitive     CLINDAMYCIN RESISTANT Resistant     RIFAMPIN <=0.5 SENSITIVE Sensitive     Inducible Clindamycin POSITIVE Resistant     * STAPHYLOCOCCUS AUREUS  Blood Culture ID Panel (Reflexed)     Status: Abnormal   Collection Time: 03/31/19  8:55 AM  Result Value Ref Range Status   Enterococcus species NOT DETECTED NOT DETECTED Final   Listeria monocytogenes NOT DETECTED NOT DETECTED Final   Staphylococcus species DETECTED (A) NOT DETECTED Final    Comment: CRITICAL RESULT CALLED TO, READ BACK BY AND VERIFIED WITH: V BRYK PHARMD 03/31/19 2344 JDW    Staphylococcus aureus (BCID) DETECTED (A) NOT DETECTED Final    Comment: Methicillin (oxacillin) susceptible Staphylococcus aureus (MSSA). Preferred therapy is anti staphylococcal beta lactam antibiotic (Cefazolin or Nafcillin), unless clinically contraindicated. CRITICAL RESULT CALLED TO, READ BACK BY AND VERIFIED WITH: V BRYK PHARMD 03/31/19 2344 JDW    Methicillin resistance NOT  DETECTED NOT DETECTED Final   Streptococcus species NOT DETECTED NOT DETECTED Final   Streptococcus agalactiae NOT DETECTED NOT DETECTED Final   Streptococcus pneumoniae NOT DETECTED NOT DETECTED Final   Streptococcus pyogenes NOT DETECTED NOT DETECTED Final   Acinetobacter baumannii NOT DETECTED NOT DETECTED Final   Enterobacteriaceae species NOT DETECTED NOT DETECTED Final   Enterobacter cloacae complex NOT DETECTED NOT DETECTED Final   Escherichia coli NOT DETECTED NOT DETECTED Final   Klebsiella oxytoca NOT DETECTED NOT DETECTED Final   Klebsiella pneumoniae NOT DETECTED NOT DETECTED Final   Proteus species NOT DETECTED NOT DETECTED Final   Serratia marcescens NOT DETECTED NOT DETECTED Final   Haemophilus influenzae NOT DETECTED NOT DETECTED Final   Neisseria meningitidis NOT DETECTED NOT DETECTED Final   Pseudomonas aeruginosa NOT DETECTED NOT DETECTED Final   Candida albicans NOT DETECTED NOT DETECTED Final   Candida glabrata NOT DETECTED NOT DETECTED Final   Candida krusei NOT DETECTED NOT DETECTED Final   Candida parapsilosis NOT DETECTED NOT DETECTED Final   Candida tropicalis NOT DETECTED NOT DETECTED Final    Comment: Performed at [facility], [place] [address] [place], [place] [address]  Culture, blood (Routine X 2) w Reflex to ID Panel     Status: Abnormal   Collection Time: 03/31/19  9:00 AM  Result Value Ref Range Status   Specimen Description BLOOD LEFT FOREARM  Final   Special Requests   Final    BOTTLES DRAWN AEROBIC AND ANAEROBIC Blood Culture adequate volume   Culture  Setup Time   Final    GRAM POSITIVE COCCI IN BOTH AEROBIC AND ANAEROBIC BOTTLES CRITICAL VALUE NOTED.  VALUE IS CONSISTENT WITH PREVIOUSLY REPORTED AND CALLED VALUE. Performed at [facility], [place] [address], [place], [place] [address]    Culture STAPHYLOCOCCUS AUREUS (  A)  Final   Report Status 04/02/2019 FINAL  Final  Aerobic Culture (superficial specimen)     Status: None    Collection Time: 03/31/19 10:08 AM  Result Value Ref Range Status   Specimen Description WOUND LVAD DRIVELINE SITE  Final   Special Requests NONE  Final   Gram Stain   Final    RARE WBC PRESENT,BOTH PMN AND MONONUCLEAR RARE GRAM POSITIVE COCCI Performed at [facility], [place] [address], [place], [place] [address]    Culture MODERATE STAPHYLOCOCCUS AUREUS  Final   Report Status 04/03/2019 FINAL  Final   Organism ID, Bacteria STAPHYLOCOCCUS AUREUS  Final      Susceptibility   Staphylococcus aureus - MIC*    CIPROFLOXACIN <=0.5 SENSITIVE Sensitive     ERYTHROMYCIN >=8 RESISTANT Resistant     GENTAMICIN <=0.5 SENSITIVE Sensitive     OXACILLIN <=0.25 SENSITIVE Sensitive     TETRACYCLINE <=1 SENSITIVE Sensitive     VANCOMYCIN <=0.5 SENSITIVE Sensitive     TRIMETH/SULFA <=10 SENSITIVE Sensitive     CLINDAMYCIN RESISTANT Resistant     RIFAMPIN <=0.5 SENSITIVE Sensitive     Inducible Clindamycin POSITIVE Resistant     * MODERATE STAPHYLOCOCCUS AUREUS  MRSA PCR Screening     Status: None   Collection Time: 03/31/19 11:27 AM  Result Value Ref Range Status   MRSA by PCR NEGATIVE NEGATIVE Final    Comment:        The GeneXpert MRSA Assay (FDA approved for NASAL specimens only), is one component of a comprehensive MRSA colonization surveillance program. It is not intended to diagnose MRSA infection nor to guide or monitor treatment for MRSA infections. Performed at [facility], [place] [address], [place], [place] [address]   SARS Coronavirus 2 (CEPHEID - Performed in [facility] hospital lab), Hosp Order     Status: None   Collection Time: 03/31/19  6:05 PM  Result Value Ref Range Status   SARS Coronavirus 2 NEGATIVE NEGATIVE Final    Comment: (NOTE) If result is NEGATIVE SARS-CoV-2 target nucleic acids are NOT DETECTED. The SARS-CoV-2 RNA is generally detectable in upper and lower  respiratory specimens during the acute phase of infection. The lowest  concentration of  SARS-CoV-2 viral copies this assay can detect is 250  copies / mL. A negative result does not preclude SARS-CoV-2 infection  and should not be used as the sole basis for treatment or other  patient management decisions.  A negative result may occur with  improper specimen collection / handling, submission of specimen other  than nasopharyngeal swab, presence of viral mutation(s) within the  areas targeted by this assay, and inadequate number of viral copies  (<250 copies / mL). A negative result must be combined with clinical  observations, patient history, and epidemiological information. If result is POSITIVE SARS-CoV-2 target nucleic acids are DETECTED. The SARS-CoV-2 RNA is generally detectable in upper and lower  respiratory specimens dur ing the acute phase of infection.  Positive  results are indicative of active infection with SARS-CoV-2.  Clinical  correlation with patient history and other diagnostic information is  necessary to determine patient infection status.  Positive results do  not rule out bacterial infection or co-infection with other viruses. If result is PRESUMPTIVE POSTIVE SARS-CoV-2 nucleic acids MAY BE PRESENT.   A presumptive positive result was obtained on the submitted specimen  and confirmed on repeat testing.  While 2019 novel coronavirus  (SARS-CoV-2) nucleic acids may be present  in the submitted sample  additional confirmatory testing may be necessary for epidemiological  and / or clinical management purposes  to differentiate between  SARS-CoV-2 and other Sarbecovirus currently known to infect humans.  If clinically indicated additional testing with an alternate test  methodology [phone]) is advised. The SARS-CoV-2 RNA is generally  detectable in upper and lower respiratory sp ecimens during the acute  phase of infection. The expected result is Negative. Fact Sheet for Patients:  StrictlyIdeas.no Fact Sheet for Healthcare  Providers: BankingDealers.co.za This test is not yet approved or cleared by the [place] FDA and has been authorized for detection and/or diagnosis of SARS-CoV-2 by FDA under an Emergency Use Authorization (EUA).  This EUA will remain in effect (meaning this test can be used) for the duration of the COVID-19 declaration under Section 564(b)(1) of the Act, 21 U.S.C. section 360bbb-3(b)(1), unless the authorization is terminated or revoked sooner. Performed at [facility], [place] [address], [place], [place] [address]   Culture, blood (routine x 2)     Status: None (Preliminary result)   Collection Time: 04/02/19  8:55 AM  Result Value Ref Range Status   Specimen Description BLOOD RIGHT ANTECUBITAL  Final   Special Requests   Final    BOTTLES DRAWN AEROBIC AND ANAEROBIC Blood Culture adequate volume   Culture   Final    NO GROWTH 2 DAYS Performed at [facility], [place] [address] [place], [place] [address]    Report Status PENDING  Incomplete  Culture, blood (routine x 2)     Status: None (Preliminary result)   Collection Time: 04/02/19  9:07 AM  Result Value Ref Range Status   Specimen Description BLOOD RIGHT HAND  Final   Special Requests   Final    BOTTLES DRAWN AEROBIC AND ANAEROBIC Blood Culture adequate volume   Culture   Final    NO GROWTH 2 DAYS Performed at [facility], [place] [address] [place], [place] [address]    Report Status PENDING  Incomplete    Janene Madeira, MSN, NP-C [facility] [place] Cell: [phone] Pager: [phone]  04/05/2019  9:34 AM

## 2019-04-05 NOTE — TOC Progression Note (Addendum)
Transition of Care [facility]) - Progression Note    Patient Details  Name: Theodore Welch MRN: [id number] Date of Birth: 05-01-1961  Transition of [facility] CM/SW Contact  Cherylann Parr, RN Phone Number: 04/05/2019, 10:33 AM  Clinical Narrative:    Pt to discharge home today with IV antibitoics.  [facility] and [facility] aware.  Pt declined barriers with returning to PTA conditions.  Pt will be followed closely by HF team   Expected Discharge Plan: Home w Home Health Services Barriers to Discharge: Barriers Resolved  Expected Discharge Plan and Services Expected Discharge Plan: Home w Home Health Services     Post Acute Care Choice: Home Health, Durable Medical Equipment Living arrangements for the past 2 months: Single Family Home Expected Discharge Date: 04/05/19                 DME Agency: [facility]) Date DME Agency Contacted: 04/05/19 Time DME Agency Contacted: [phone] Representative spoke with at DME Agency: Pam HH Arranged: RN HH Agency: [facility] Date HH Agency Contacted: 03/29/19 Time HH Agency Contacted: [phone] Representative spoke with at [facility] Agency: Lupita Leash   Social Determinants of Health (SDOH) Interventions    Readmission Risk Interventions Readmission Risk Prevention Plan 04/02/2019  Transportation Screening Complete  HRI or Home Care Consult Complete  Social Work Consult for Recovery Care Planning/Counseling Complete  Palliative Care Screening Not Applicable  Medication Review [employer]) Complete  Some recent data might be hidden

## 2019-04-05 NOTE — Progress Notes (Signed)
Peripherally Inserted Central Catheter/Midline Placement  The IV Nurse has discussed with the patient and/or persons authorized to consent for the patient, the purpose of this procedure and the potential benefits and risks involved with this procedure.  The benefits include less needle sticks, lab draws from the catheter, and the patient may be discharged home with the catheter. Risks include, but not limited to, infection, bleeding, blood clot (thrombus formation), and puncture of an artery; nerve damage and irregular heartbeat and possibility to perform a PICC exchange if needed/ordered by physician.  Alternatives to this procedure were also discussed.  Bard Power PICC patient education guide, fact sheet on infection prevention and patient information card has been provided to patient /or left at bedside.    PICC/Midline Placement Documentation        Theodore Welch 04/05/2019, 4:39 PM

## 2019-04-05 NOTE — Plan of Care (Signed)
  Problem: Clinical Measurements: Goal: Will remain free from infection Outcome: Progressing Goal: Cardiovascular complication will be avoided Outcome: Progressing   Problem: Education: Goal: Patient will be able to verbalize current INR target range and antiplatelet therapy for discharge home Outcome: Progressing   Problem: Clinical Measurements: Goal: Ability to maintain clinical measurements within normal limits will improve Outcome: Progressing Goal: Will remain free from infection Outcome: Progressing Goal: Diagnostic test results will improve Outcome: Progressing Goal: Cardiovascular complication will be avoided Outcome: Progressing   Problem: Pain Managment: Goal: General experience of comfort will improve Outcome: Progressing   Problem: Safety: Goal: Ability to remain free from injury will improve Outcome: Progressing   Problem: Skin Integrity: Goal: Risk for impaired skin integrity will decrease Outcome: Progressing   

## 2019-04-05 NOTE — Progress Notes (Signed)
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   HM III LVAD implanted on 08/03/18 by Dr. Laneta Simmers under Destination Therapy criteria.  Pt sitting up in recliner eating lunch. He is looking forward to going home after PICC line placement. Follow up appointment scheduled. Provided patient with 10 daily, 6 anchors, and cotton tip applicators.   Vital signs: Temp: 98.2 HR: 101 Doppler Pressure:  94 Automatic BP:  108/91 (98) O2 Sat: 100% RA Wt: 175.4>176.6>176.5> 185.4lbs    LVAD interrogation reveals:  Speed: 5700 Flow: 5.1 Power:  4.5 PI: 3.2 Alarms:  none Events:   Hematocrit: 31 Fixed speed: 5700 Low speed limit: 5400  Drive Line:  Daily dressing changes using daily kits; pack silver strips in tunneling areas. Bedside nurse may change.  Changed by bedside nurse this morning.        Labs:  LDH trend: 148>129>163>174  INR trend: 2.1>2.2>2.1>2.2  WBC trend: 19.9>15.5>13.1>15.1   Anticoagulation Plan: -INR Goal: 2.0 - 2.5 -ASA Dose: 81 mg  Infection:  - 03/31/19 blood cultures x 2>>gm pos cocci  - 03/31/19 DL wound culture>>rare gram positive cocci    Plan/Recommendations:  1. Daily dressing changes using daily kits; pack tunneled areas with silver strips. Bedside nurse may change. 2. Call VAD Pager if any VAD equipment or drive line issues. 3. Plan for PICC insertion today. Potential discharge home today per Dr. Shirlee Latch.     Alyce Pagan RN VAD Coordinator  Office: [phone]  24/7 Pager: [phone]

## 2019-04-05 NOTE — Progress Notes (Signed)
ANTICOAGULATION CONSULT NOTE - Follow Up Consult  Pharmacy Consult for Coumadin Indication: LVAD  No Known Allergies  Patient Measurements: Height: 5\' 10"  (177.8 cm) Weight: 185 lb 6.5 oz (84.1 kg) IBW/kg (Calculated) : 73   Vital Signs: Temp: 98.5 F (36.9 C) (05/18 0346) Temp Source: Oral (05/18 0346) BP: 96/83 (05/18 0346) Pulse Rate: 94 (05/18 0346)  Labs: Recent Labs    04/03/19 0500 04/04/19 0720 04/05/19 0540  HGB 9.0* 8.9* 9.9*  HCT 29.1* 29.2* 33.0*  PLT 242 296 348  LABPROT 23.4* 24.7* 24.0*  INR 2.1* 2.3* 2.2*  CREATININE 0.96 0.95 1.03    Estimated Creatinine Clearance: 78.7 mL/min (by C-G formula based on SCr of 1.03 mg/dL).  Assessment: Theodore Welch is a 58 y.o. male admitted on 03/31/19 with LVAD driveline infection. Pharmacy has been consulted for warfarin dosing.  INR continues to be within goal today at 2.2. hgb stable about 9, LDH stable. No s/sx of bleeding.  PTA regimen 5 mg daily except 7.5 on Tu/Sat  Goal of Therapy:  INR 2-2.5 Monitor platelets by anticoagulation protocol: Yes   Plan:  1. Restart Home Coumadin 5mg  daily except 7.5mg  Tu/Sat 2. Daily INR.  [name] Pharm.D. CPP, BCPS Clinical Pharmacist [phone] 04/05/2019 9:05 AM

## 2019-04-05 NOTE — Progress Notes (Signed)
Patient ID: Theodore BaarsBilly E Branton, male   DOB: 01/01/1961, 58 y.o.   MRN: [id number]   Advanced Heart Failure VAD Team Note  PCP-Cardiologist: No primary care provider on file.   Subjective:    MSSA in blood and wound cultures, now on cefazolin. Abdominal pain much improved. No fevers or chills. Minimal drainage now.    MAP 90s, low dose Entresto started back yesterday.     CT chest/abdomen/pelvis did not show abscess at driveline site.    LVAD INTERROGATION:  HeartMate 3 LVAD:   Flow 5.1 liters/min, speed 5700, power 4.5, PI 2.4. VAD interrogated personally. Parameters stable. Occasional PI events (chronic)  Objective:    Vital Signs:   Temp:  [97.7 F (36.5 C)-100.1 F (37.8 C)] 98.8 F (37.1 C) (05/18 0745) Pulse Rate:  [89-98] 92 (05/18 0745) Resp:  [20-34] 26 (05/18 0745) BP: (88-121)/(60-91) 103/91 (05/18 0745) SpO2:  [97 %-100 %] 99 % (05/18 0745) Weight:  [84.1 kg] 84.1 kg (05/18 0346) Last BM Date: 04/04/19 Mean arterial Pressure 90s  Intake/Output:   Intake/Output Summary (Last 24 hours) at 04/05/2019 0940 Last data filed at 04/05/2019 0900 Gross per 24 hour  Intake 932.31 ml  Output 600 ml  Net 332.31 ml     Physical Exam    General: Well appearing this am. NAD.  HEENT: Normal. Neck: Supple, JVP 7-8 cm. Carotids OK.  Cardiac:  Mechanical heart sounds with LVAD hum present.  Lungs:  CTAB, normal effort.  Abdomen:  Tenderness completely resolved, ND, no HSM. No bruits or masses. +BS  LVAD exit site: Well-healed and incorporated. Dressing dry and intact. No erythema or drainage. Stabilization device present and accurately applied. Driveline dressing changed daily per sterile technique. Extremities:  Warm and dry. No cyanosis, clubbing, rash, or edema.  Neuro:  Alert & oriented x 3. Cranial nerves grossly intact. Moves all 4 extremities w/o difficulty. Affect pleasant     Telemetry   NSR 80s with occasional PVCs (personally reviewed)  Labs   Basic Metabolic  Panel: Recent Labs  Lab 04/01/19 0354 04/02/19 0500 04/03/19 0500 04/04/19 0720 04/05/19 0540  NA 136 137 137 138 139  K 3.4* 3.5 3.3* 3.6 4.1  CL 99 100 100 101 103  CO2 26 26 25 25 24   GLUCOSE 114* 115* 119* 111* 125*  BUN 10 8 8  5* 6  CREATININE 1.11 1.04 0.96 0.95 1.03  CALCIUM 8.9 8.6* 8.8* 8.6* 9.1    Liver Function Tests: Recent Labs  Lab 03/31/19 1106  AST 19  ALT 14  ALKPHOS 132*  BILITOT 1.2  PROT 7.3  ALBUMIN 3.1*   No results for input(s): LIPASE, AMYLASE in the last 168 hours. No results for input(s): AMMONIA in the last 168 hours.  CBC: Recent Labs  Lab 04/01/19 0354 04/02/19 0500 04/03/19 0500 04/04/19 0720 04/05/19 0540  WBC 15.5* 13.1* 13.7* 13.2* 15.1*  NEUTROABS 11.0* 9.0* 9.2* 9.2* 10.2*  HGB 9.5* 9.6* 9.0* 8.9* 9.9*  HCT 30.6* 31.1* 29.1* 29.2* 33.0*  MCV 77.3* 77.0* 77.0* 78.1* 78.8*  PLT 196 227 242 296 348    INR: Recent Labs  Lab 04/01/19 0354 04/02/19 0500 04/03/19 0500 04/04/19 0720 04/05/19 0540  INR 2.2* 2.1* 2.1* 2.3* 2.2*    Other results:      Imaging   [place] Ekg Site Rite  Result Date: 04/05/2019 If Site Rite image not attached, placement could not be confirmed due to current cardiac rhythm.    Medications:     Scheduled  Medications: . aspirin EC  81 mg Oral Daily  . atorvastatin  20 mg Oral q1800  . carbamide peroxide  5 drop Both EARS TID  . DULoxetine  60 mg Oral Daily  . gabapentin  600 mg Oral TID  . insulin aspart  0-15 Units Subcutaneous TID WC  . insulin aspart  0-5 Units Subcutaneous QHS  . pantoprazole  40 mg Oral Daily  . sacubitril-valsartan  1 tablet Oral BID  . sodium chloride flush  10-40 mL Intracatheter Q12H  . spironolactone  25 mg Oral Daily  . temazepam  30 mg Oral QHS  . thiamine  100 mg Oral Daily  . vitamin B-12  1,000 mcg Oral Daily  . warfarin  5 mg Oral Once per day on Sun Mon Wed Thu Fri  . [START ON 04/06/2019] warfarin  7.5 mg Oral Once per day on Tue Sat  . Warfarin -  Pharmacist Dosing Inpatient   Does not apply q1800  . zinc sulfate  220 mg Oral Daily    Infusions: . sodium chloride 250 mL (04/01/19 1435)  .  ceFAZolin (ANCEF) IV 2 g (04/05/19 0543)    PRN Medications: sodium chloride, acetaminophen, docusate sodium, fluticasone, hydrOXYzine, ipratropium-albuterol, methocarbamol, nicotine, nystatin, ondansetron (ZOFRAN) IV, oxyCODONE, polyethylene glycol, sodium chloride flush, traMADol, traZODone   Assessment/Plan:    1. Driveline infection: MSSA driveline infection in 1/20.  Completed course of doxycycline as outpatient 4/22 for driveline infection. Admitted with upper abdominal tenderness and increased driveline drainage, WBCs 19.9 => 15.5 => 13 => 13.7 => 13.2 => 15 today.  CT chest/abd/pelvis did not show abscess. Now with MSSA in blood and wound cultures again. Abdominal tenderness resolved, afebrile, much decreased drainage. No plans for surgical debridement for now.  - He is on cefazolin, will continue 6 wks.  Per ID, do not absolutely need TEE at this point unless there are additional complications.  He will need a suppressive antibiotic po after the 6 wks of cefazolin.  - PICC to be placed today.  2. 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 LVAD parameters stable except for frequent PI events (no low flows). This is chronic for him. MAP 90s. He is not volume overloaded.  LDH 174, INR 2.2 today. Discussed dosing with PharmD personally. - He does not need Lasix.  - Increase Entresto back to 49/51 bid (home dose) and continue spironolactone.  He may not need amlodipine.  - Continue ASA 81 daily.  - Can continue warfarin for now given no plans for I&D at this point. INR 2.2.  - Should be transplant candidate eventually if he can stay off cigarettes,he has almost quit. 3. Smoking: Knows he needs to quit to be a transplant candidate and working on it.  4. RLE DVT: On warfarin for LVAD.  5. OSA:  Continue CPAP.  5. Hyperlipidemia: Continue atorvastatin.  6. Type II diabetes: SSI while inpatient.  7. Insomnia: Will give temazepam 8. Disposition: Should be able to go home after PICC placed but will check with Dr. Donata Clay.  He will need followup with ID.  He will need LVAD followup.  Continue home meds except he will need cefazolin IV x 6 wks and will stop amlodipine for now.   I reviewed the LVAD parameters from today, and compared the results to the patient's prior recorded data.  No programming changes were made.  The LVAD is functioning within specified parameters.  The patient performs LVAD self-test daily.  LVAD interrogation was negative for any significant power changes, alarms or PI events/speed drops.  LVAD equipment check completed and is in good working order.  Back-up equipment present.   LVAD education done on emergency procedures and precautions and reviewed exit site care.  Length of Stay: 5  Marca Ancona, MD 04/05/2019, 9:40 AM  VAD Team --- VAD ISSUES ONLY--- Pager [phone] (7am - 7am)  Advanced Heart Failure Team  Pager [phone] (M-F; 7a - 4p)  Please contact [facility] for night-coverage after hours (4p -7a ) and weekends on [email or web address]

## 2019-04-05 NOTE — Plan of Care (Signed)
  Problem: Clinical Measurements: Goal: Will remain free from infection 04/05/2019 1421 by Don Perking, RN Outcome: Completed/Met 04/05/2019 0920 by Don Perking, RN Outcome: Progressing Goal: Cardiovascular complication will be avoided 04/05/2019 1421 by Don Perking, RN Outcome: Completed/Met 04/05/2019 0920 by Don Perking, RN Outcome: Progressing   Problem: Education: Goal: Patient will be able to verbalize current INR target range and antiplatelet therapy for discharge home 04/05/2019 1421 by Don Perking, RN Outcome: Completed/Met 04/05/2019 0920 by Don Perking, RN Outcome: Progressing   Problem: Clinical Measurements: Goal: Ability to maintain clinical measurements within normal limits will improve 04/05/2019 1421 by Don Perking, RN Outcome: Completed/Met 04/05/2019 0920 by Don Perking, RN Outcome: Progressing Goal: Will remain free from infection 04/05/2019 1421 by Don Perking, RN Outcome: Completed/Met 04/05/2019 0920 by Don Perking, RN Outcome: Progressing Goal: Diagnostic test results will improve 04/05/2019 1421 by Don Perking, RN Outcome: Completed/Met 04/05/2019 0920 by Don Perking, RN Outcome: Progressing Goal: Cardiovascular complication will be avoided 04/05/2019 1421 by Don Perking, RN Outcome: Completed/Met 04/05/2019 0920 by Don Perking, RN Outcome: Progressing   Problem: Pain Managment: Goal: General experience of comfort will improve 04/05/2019 1421 by Don Perking, RN Outcome: Completed/Met 04/05/2019 0920 by Don Perking, RN Outcome: Progressing   Problem: Safety: Goal: Ability to remain free from injury will improve 04/05/2019 1421 by Don Perking, RN Outcome: Completed/Met 04/05/2019 0920 by Don Perking, RN Outcome: Progressing   Problem: Skin Integrity: Goal: Risk for impaired skin integrity will decrease 04/05/2019 1421 by Don Perking, RN Outcome: Completed/Met 04/05/2019 0920 by Don Perking, RN Outcome: Progressing

## 2019-04-05 NOTE — Discharge Summary (Signed)
Advanced Heart Failure Team  Discharge Summary   Patient ID: Theodore Welch MRN: [id number], DOB/AGE: [birth year] 60 y.o. 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 Chronic systolic HF 3. Smoking 4. RLE DVT 5. OSA 6. Hyperlipidemia 7. Type 2 DM 8. Insomnia  Hospital Course:  Theodore Welch is a 58 y.o. 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  He was admitted from [place] [facility] on 5/13 with driveline infection. He was started on broad spectrum antibiotics. Blood and wound cultures were positive for MSSA. ID consulted and he transitioned to Cefazolin. CT chest/abd/pelvis showed no abscess. PICC line placed once blood cultures were clean.  1. Driveline infection: MSSA driveline infection in 1/20.  Completed course of doxycycline as outpatient 4/22 for driveline infection. Admitted with upper abdominal tenderness and increased driveline drainage, WBCs improved to 15 by day of DC. CT chest/abd/pelvis did not show abscess. MSSA in blood and wound cultures. Abdominal tenderness resolved, afebrile, much decreased drainage. No plans for surgical debridement for now.  - Continue cefazolin for 6 wks.  Per ID, do not absolutely need TEE at this point unless there are additional complications.  He will need a suppressive antibiotic po after the 6 wks of cefazolin.  - PICC placed on day of discharge and will be managed by [facility]. 2. Chronic systolic JJO:[id number] cardiomyopathy, now s/p Heartmate 3 LVAD in 9/19. LVAD parameters stable except for frequent PI events (no low flows). This is chronic for him. - He does not need Lasix. - Continue Entresto 49/51 bid and continue spironolactone. Now off amlodipine with stable MAPs - Continue ASA 81 daily.  -Continue warfarin for now given no plans for I&D at this point.  - Should be transplant candidate  eventually if he can stay off cigarettes,he has almost quit. 3. Smoking: Knows he needs to quit to be a transplant candidate and working on it. 4. RLE DVT: On warfarin for LVAD. 5. WFU:[id number].  5. Hyperlipidemia:Continue atorvastatin.  6. Type II diabetes:SSI while inpatient. 7. Insomnia: Given temazepam  PICC line and IV antibiotics will be managed by [facility]. He will be followed closely in VAD clinic, with appointment as below. He will also follow up with ID, with appointment as below.   LVAD Interrogation HM III:   Speed: 5700     Flow: 5.1      PI: 2.4     Power: 4.5           Discharge Weight: 185 lbs Discharge Vitals: Blood pressure (!) 103/91, pulse 92, temperature 98.8 F (37.1 C), temperature source Oral, resp. rate (!) 26, height _0  (1.778 m), weight 84.1 kg, SpO2 99 %.  Labs: Lab Results  Component Value Date   WBC 15.1 (H) 04/05/2019   HGB 9.9 (L) 04/05/2019   HCT 33.0 (L) 04/05/2019   MCV 78.8 (L) 04/05/2019   PLT 348 04/05/2019    Recent Labs  Lab 03/31/19 1106  04/05/19 0540  NA 133*   < > 139  K 3.6   < > 4.1  CL 97*   < > 103  CO2 23   < > 24  BUN 10   < > 6  CREATININE 1.25*   < > 1.03  CALCIUM 9.0   < > 9.1  PROT 7.3  --   --   BILITOT  1.2  --   --   ALKPHOS 132*  --   --   ALT 14  --   --   AST 19  --   --   GLUCOSE 125*   < > 125*   < > = values in this interval not displayed.   Lab Results  Component Value Date   CHOL 97 07/21/2018   HDL 32 (L) 07/21/2018   LDLCALC 57 07/21/2018   TRIG 39 07/21/2018   BNP (last 3 results) Recent Labs    07/21/18 0540 08/04/18 0902 08/10/18 0026  BNP 1,343.9* 658.6* 537.0*    ProBNP (last 3 results) No results for input(s): PROBNP in the last 8760 hours.   Diagnostic Studies/Procedures   CT chest/abdomen/pelvis 03/31/19: 1. No evidence for focal or rim enhancing fluid collection in the anterior abdominal wall along the drive line site. There is some confluent, homogeneous soft  tissue around the drive line and just caudal to it, likely reflecting granulation/evolving scar. The subcutaneous edema seen just caudal to the drive line on the previous study has resolved in the interval. 2. No acute findings identified in the chest, abdomen, or pelvis. 3.  Aortic Atherosclerois (ICD10-170.0)  Discharge Medications   Allergies as of 04/05/2019   No Known Allergies     Medication List    STOP taking these medications   amLODipine 5 MG tablet Commonly known as:  NORVASC   doxycycline 50 MG capsule Commonly known as:  VIBRAMYCIN     TAKE these medications   aspirin EC 81 MG tablet Take 81 mg by mouth daily.   atorvastatin 20 MG tablet Commonly known as:  LIPITOR Take 1 tablet (20 mg total) by mouth daily.   carbamide peroxide 6.5 % OTIC solution Commonly known as:  DEBROX Place 5 drops into both ears 3 (three) times daily.   ceFAZolin  IVPB Commonly known as:  ANCEF Inject 2 g into the vein every 8 (eight) hours. Indication:  MSSA bacteremia Last Day of Therapy:  05/16/2019 Labs - Once weekly:  CBC/D and BMP, Labs - Every other week:  ESR and CRP   docusate sodium 100 MG capsule Commonly known as:  COLACE Take 2 capsules (200 mg total) by mouth daily as needed for mild constipation.   DULoxetine 60 MG capsule Commonly known as:  CYMBALTA Take 60 mg by mouth daily.   fluticasone 50 MCG/ACT nasal spray Commonly known as:  FLONASE Place 1 spray into both nostrils 2 (two) times daily as needed for allergies.   freestyle lancets Use as instructed   gabapentin 300 MG capsule Commonly known as:  NEURONTIN Take 600 mg by mouth 3 (three) times daily.   glucose blood test strip Commonly known as:  Contour Next Test Use as instructed   hydrOXYzine 25 MG capsule Commonly known as:  VISTARIL Take 25 mg by mouth 3 (three) times daily as needed for anxiety.   Ipratropium-Albuterol 20-100 MCG/ACT Aers respimat Commonly known as:  COMBIVENT Inhale 1  puff into the lungs every 6 (six) hours.   metFORMIN 500 MG 24 hr tablet Commonly known as:  GLUCOPHAGE-XR Take 500 mg by mouth 2 (two) times daily.   methocarbamol 750 MG tablet Commonly known as:  ROBAXIN Take 750 mg by mouth 3 (three) times daily as needed for muscle spasms.   nicotine 7 mg/24hr patch Commonly known as:  NICODERM CQ - dosed in mg/24 hr Place 1 patch (7 mg total) onto the skin daily as needed (  tobacco craving).   nystatin powder Commonly known as:  MYCOSTATIN/NYSTOP Use with VAD dressing changes daily, advance as directed.   omeprazole 20 MG capsule Commonly known as:  PRILOSEC Take 20 mg by mouth daily.   oxyCODONE 5 MG immediate release tablet Commonly known as:  Oxy IR/ROXICODONE Take 1 - 2 tabs q 4 - 6 hrs prn pain What changed:    how much to take  how to take this  when to take this  reasons to take this  additional instructions   polyethylene glycol 17 g packet Commonly known as:  MIRALAX / GLYCOLAX Take 17 g by mouth daily as needed for mild constipation.   sacubitril-valsartan 49-51 MG Commonly known as:  Entresto Take 1 tablet by mouth 2 (two) times daily.   sildenafil 100 MG tablet Commonly known as:  VIAGRA Take 50 mg by mouth daily as needed for erectile dysfunction.   spironolactone 25 MG tablet Commonly known as:  ALDACTONE Take 1 tablet (25 mg total) by mouth daily.   thiamine 100 MG tablet Take 1 tablet (100 mg total) by mouth daily.   traMADol 50 MG tablet Commonly known as:  ULTRAM Take 1 tablet (50 mg total) by mouth every 4 (four) hours as needed for moderate pain.   vitamin B-12 500 MCG tablet Commonly known as:  CYANOCOBALAMIN Take 1,000 mcg by mouth daily.   warfarin 5 MG tablet Commonly known as:  COUMADIN Take as directed. If you are unsure how to take this medication, talk to your nurse or doctor. Original instructions:  Take 5-7.5 mg by mouth See admin instructions. Take  1 and 1/2 tablets (7.5 mg) Tue  and Sat Take 1 tablet by mouth all other days Or as directed by AHF clinic   Zinc Gluconate 100 MG Tabs Take 1 tablet (100 mg total) by mouth daily. What changed:  when to take this            Home Infusion Instuctions  (From admission, onward)         Start     Ordered   04/05/19 0000  Home infusion instructions [facility] May follow [place] Dosing Protocol; May administer Cathflo as needed to maintain patency of vascular access device.; Flushing of vascular access device: per [facility] Protocol: 0.9% NaCl pre/post medica...    Question Answer Comment  Instructions May follow [place] Dosing Protocol   Instructions May administer Cathflo as needed to maintain patency of vascular access device.   Instructions Flushing of vascular access device: per [facility] Protocol: 0.9% NaCl pre/post medication administration and prn patency; Heparin 100 u/ml, 29m for implanted ports and Heparin 10u/ml, 511mfor all other central venous catheters.   Instructions May follow [facility] Anaphylaxis Protocol for First Dose Administration in the home: 0.9% NaCl at 25-50 ml/hr to maintain IV access for protocol meds. Epinephrine 0.3 ml IV/IM PRN and Benadryl 25-50 IV/IM PRN s/s of anaphylaxis.   Instructions [facility] Infusion Coordinator (RN) to assist per patient IV care needs in the home PRN.      04/05/19 1020           Durable Medical Equipment  (From admission, onward)         Start     Ordered   04/05/19 0000  Heart failure home health orders  (Heart failure home health orders / Face to face)    Comments:  Heart Failure Follow-up Care:  Verify follow-up appointments per Patient Discharge Instructions. Confirm  transportation arranged. Reconcile home medications with discharge medication list. Remove discontinued medications from use. Assist patient/caregiver to manage medications using pill box. Reinforce low sodium food selection Assessments: Vital signs and oxygen saturation at  each visit. Assess home environment for safety concerns, caregiver support and availability of low-sodium foods. Consult Education officer, museum, PT/OT, Dietitian, and CNA based on assessments. Perform comprehensive cardiopulmonary assessment. Notify MD for any change in condition or weight gain of 3 pounds in one day or 5 pounds in one week with symptoms. Daily Weights and Symptom Monitoring: Ensure patient has access to scales. Teach patient/caregiver to weigh daily before breakfast and after voiding using same scale and record.    Teach patient/caregiver to track weight and symptoms and when to notify Provider. Activity: Develop individualized activity plan with patient/caregiver.  Change PICC line dressing  Labs as directed by ID.  Question Answer Comment  Heart Failure Follow-up Care [facility] at [phone]   Lab frequency Other see comments   Fax lab results to [facility] Clinic at [phone]   Diet Low Sodium Heart Healthy   Fluid restrictions: 1800 mL Fluid      04/05/19 1020           Discharge Care Instructions  (From admission, onward)         Start     Ordered   04/05/19 0000  Change dressing (specify)    Comments:  Change driveline dressing daily as directed by LVAD coordinator.   04/05/19 1020          Disposition   The patient will be discharged in stable condition to home. Discharge Instructions    (HEART FAILURE PATIENTS) Call MD:  Anytime you have any of the following symptoms: 1) 3 pound weight gain in 24 hours or 5 pounds in 1 week 2) shortness of breath, with or without a dry hacking cough 3) swelling in the hands, feet or stomach 4) if you have to sleep on extra pillows at night in order to breathe.   Complete by:  As directed    Call MD for:  redness, tenderness, or signs of infection (pain, swelling, redness, odor or green/yellow discharge around incision site)   Complete by:  As directed    Call MD for:  temperature >100.4    Complete by:  As directed    Change dressing (specify)   Complete by:  As directed    Change driveline dressing daily as directed by LVAD coordinator.   Diet - low sodium heart healthy   Complete by:  As directed    Face-to-face encounter (required for Medicare/Medicaid patients)   Complete by:  As directed    I Georgiana Shore certify that this patient is under my care and that I, or a nurse practitioner or physician's assistant working with me, had a face-to-face encounter that meets the physician face-to-face encounter requirements with this patient on 04/05/2019. The encounter with the patient was in whole, or in part for the following medical condition(s) which is the primary reason for home health care (List medical condition): systolic heart failure, bacteremia   The encounter with the patient was in whole, or in part, for the following medical condition, which is the primary reason for home health care:  systolic heart failure, bacteremia   I certify that, based on my findings, the following services are medically necessary home health services:  Nursing   Reason for Medically Necessary Home Health Services:  Skilled Nursing- Assessment and Training  for Infusion Therapy, Line Care, and Infection Control   My clinical findings support the need for the above services:  Shortness of breath with activity   Further, I certify that my clinical findings support that this patient is homebound due to:  Shortness of Breath with activity   Heart Failure patients record your daily weight using the same scale at the same time of day   Complete by:  As directed    Heart failure home health orders   Complete by:  As directed    Heart Failure Follow-up Care:  Verify follow-up appointments per Patient Discharge Instructions. Confirm transportation arranged. Reconcile home medications with discharge medication list. Remove discontinued medications from use. Assist patient/caregiver to manage medications  using pill box. Reinforce low sodium food selection Assessments: Vital signs and oxygen saturation at each visit. Assess home environment for safety concerns, caregiver support and availability of low-sodium foods. Consult Education officer, museum, PT/OT, Dietitian, and CNA based on assessments. Perform comprehensive cardiopulmonary assessment. Notify MD for any change in condition or weight gain of 3 pounds in one day or 5 pounds in one week with symptoms. Daily Weights and Symptom Monitoring: Ensure patient has access to scales. Teach patient/caregiver to weigh daily before breakfast and after voiding using same scale and record.    Teach patient/caregiver to track weight and symptoms and when to notify Provider. Activity: Develop individualized activity plan with patient/caregiver.  Change PICC line dressing  Labs as directed by ID.   Heart Failure Follow-up Care:  [facility] at [phone]   Lab frequency:  Other see comments   Fax lab results to:  [facility] Clinic at [phone]   Diet:  Low Sodium Heart Healthy   Fluid restrictions:  1800 mL Fluid   Home infusion instructions [facility] May follow [place] Dosing Protocol; May administer Cathflo as needed to maintain patency of vascular access device.; Flushing of vascular access device: per [facility] Protocol: 0.9% NaCl pre/post medica...   Complete by:  As directed    Instructions:  May follow [place] Dosing Protocol   Instructions:  May administer Cathflo as needed to maintain patency of vascular access device.   Instructions:  Flushing of vascular access device: per [facility] Protocol: 0.9% NaCl pre/post medication administration and prn patency; Heparin 100 u/ml, 70m for implanted ports and Heparin 10u/ml, 570mfor all other central venous catheters.   Instructions:  May follow [facility] Anaphylaxis Protocol for First Dose Administration in the home: 0.9% NaCl at 25-50 ml/hr to maintain IV access for protocol meds.  Epinephrine 0.3 ml IV/IM PRN and Benadryl 25-50 IV/IM PRN s/s of anaphylaxis.   Instructions:  [facility] Coord[facility] (RN) to assist per patient IV care needs in the home PRN.   INR  Goal: 2 - 2.5   Complete by:  As directed    Goal:  2 - 2.5   Increase activity slowly   Complete by:  As directed    Page VAD Coordinator at [phone] for: any VAD alarms, sustained elevations of power >10 watts, sustained drop in Pulse Index <3   Complete by:  As directed    Notify for:   any VAD alarms sustained elevations of power >10 watts sustained drop in Pulse Index <3     Speed Settings:   Complete by:  As directed    Fixed 5700 RPM Low 5100 RPM     Follow-up [facility]  Infectious Disease Follow up on 05/11/2019.   Specialty:  Infectious Diseases Why:  9:00 am appointment with Janene Madeira, NP  Contact information: [address] [place], [place] [id number] [place]       Larey Dresser, MD Follow up on 04/14/2019.   Specialty:  Cardiology Why:  VAD follow up. 8:30 am. Please arriave at 8 am. Garage code for May is [id number].  Contact information: [place] [address] [phone]             Duration of Discharge Encounter: Greater than 35 minutes   Signed, Georgiana Shore, NP  04/05/2019, 10:57 AM

## 2019-04-05 NOTE — Progress Notes (Signed)
Discharge instructions explained and discussed with patient. Extra driveline dressing changes given, IV team came to heparinized PICC. Pt going home with [place]. No voice complains at this time.

## 2019-04-05 NOTE — Discharge Instructions (Signed)
Information on my medicine - Coumadin®   (Warfarin) ° °This medication education was reviewed with me or my healthcare representative as part of my discharge preparation.   ° °Why was Coumadin prescribed for you? °Coumadin was prescribed for you because you have a blood clot or a medical condition that can cause an increased risk of forming blood clots. Blood clots can cause serious health problems by blocking the flow of blood to the heart, lung, or brain. Coumadin can prevent harmful blood clots from forming. °As a reminder your indication for Coumadin is:   Blood Clot Prevention After Heart Pump Surgery ° °What test will check on my response to Coumadin? °While on Coumadin (warfarin) you will need to have an INR test regularly to ensure that your dose is keeping you in the desired range. The INR (international normalized ratio) number is calculated from the result of the laboratory test called prothrombin time (PT). ° °If an INR APPOINTMENT HAS NOT ALREADY BEEN MADE FOR YOU please schedule an appointment to have this lab work done by your health care provider within 7 days. °Your INR goal is a number between:  2-2.5  ° °What  do you need to  know  About  COUMADIN? °Take Coumadin (warfarin) exactly as prescribed by your healthcare provider about the same time each day.  DO NOT stop taking without talking to the doctor who prescribed the medication.  Stopping without other blood clot prevention medication to take the place of Coumadin may increase your risk of developing a new clot or stroke.  Get refills before you run out. ° °What do you do if you miss a dose? °If you miss a dose, take it as soon as you remember on the same day then continue your regularly scheduled regimen the next day.  Do not take two doses of Coumadin at the same time. ° °Important Safety Information °A possible side effect of Coumadin (Warfarin) is an increased risk of bleeding. You should call your healthcare provider right away if you  experience any of the following: °? Bleeding from an injury or your nose that does not stop. °? Unusual colored urine (red or dark brown) or unusual colored stools (red or black). °? Unusual bruising for unknown reasons. °? A serious fall or if you hit your head (even if there is no bleeding). ° °Some foods or medicines interact with Coumadin® (warfarin) and might alter your response to warfarin. To help avoid this: °? Eat a balanced diet, maintaining a consistent amount of Vitamin K. °? Notify your provider about major diet changes you plan to make. °? Avoid alcohol or limit your intake to 1 drink for women and 2 drinks for men per day. °(1 drink is 5 oz. wine, 12 oz. beer, or 1.5 oz. liquor.) ° °Make sure that ANY health care provider who prescribes medication for you knows that you are taking Coumadin (warfarin).  Also make sure the healthcare provider who is monitoring your Coumadin knows when you have started a new medication including herbals and non-prescription products. ° °Coumadin® (Warfarin)  Major Drug Interactions  °Increased Warfarin Effect Decreased Warfarin Effect  °Alcohol (large quantities) °Antibiotics (esp. Septra/Bactrim, Flagyl, Cipro) °Amiodarone (Cordarone) °Aspirin (ASA) °Cimetidine (Tagamet) °Megestrol (Megace) °NSAIDs (ibuprofen, naproxen, etc.) °Piroxicam (Feldene) °Propafenone (Rythmol SR) °Propranolol (Inderal) °Isoniazid (INH) °Posaconazole (Noxafil) Barbiturates (Phenobarbital) °Carbamazepine (Tegretol) °Chlordiazepoxide (Librium) °Cholestyramine (Questran) °Griseofulvin °Oral Contraceptives °Rifampin °Sucralfate (Carafate) °Vitamin K  ° °Coumadin® (Warfarin) Major Herbal Interactions  °Increased Warfarin Effect Decreased Warfarin Effect  °  Garlic °Ginseng °Ginkgo biloba Coenzyme Q10 °Green tea °St. John’s wort   ° °Coumadin® (Warfarin) FOOD Interactions  °Eat a consistent number of servings per week of foods HIGH in Vitamin K °(1 serving = ½ cup)  °Collards (cooked, or boiled &  drained) °Kale (cooked, or boiled & drained) °Mustard greens (cooked, or boiled & drained) °Parsley *serving size only = ¼ cup °Spinach (cooked, or boiled & drained) °Swiss chard (cooked, or boiled & drained) °Turnip greens (cooked, or boiled & drained)  °Eat a consistent number of servings per week of foods MEDIUM-HIGH in Vitamin K °(1 serving = 1 cup)  °Asparagus (cooked, or boiled & drained) °Broccoli (cooked, boiled & drained, or raw & chopped) °Brussel sprouts (cooked, or boiled & drained) *serving size only = ½ cup °Lettuce, raw (green leaf, endive, romaine) °Spinach, raw °Turnip greens, raw & chopped  ° °These websites have more information on Coumadin (warfarin):  www.coumadin.com; °www.ahrq.gov/consumer/coumadin.htm; ° ° ° °

## 2019-04-05 NOTE — Progress Notes (Signed)
PHARMACY CONSULT NOTE FOR:  OUTPATIENT  PARENTERAL ANTIBIOTIC THERAPY (OPAT)  Indication: MSSA bactreremia Regimen: cefazolin 2g IV q8h End date: 05/16/2019  IV antibiotic discharge orders are pended. To discharging provider:  please sign these orders via discharge navigator,  Select New Orders & click on the button choice - Manage This Unsigned Work.     Thank you for allowing pharmacy to be a part of this patient's care.  Sharin Mons, PharmD, BCPS, BCIDP Infectious Diseases Clinical Pharmacist Phone: [phone] 04/05/2019, 10:25 AM

## 2019-04-07 LAB — CULTURE, BLOOD (ROUTINE X 2)
Culture: NO GROWTH
Culture: NO GROWTH
Special Requests: ADEQUATE
Special Requests: ADEQUATE

## 2019-04-13 ENCOUNTER — Telehealth (HOSPITAL_COMMUNITY): Payer: Self-pay

## 2019-04-13 ENCOUNTER — Other Ambulatory Visit (HOSPITAL_COMMUNITY): Payer: Self-pay | Admitting: *Deleted

## 2019-04-13 ENCOUNTER — Other Ambulatory Visit (HOSPITAL_COMMUNITY): Payer: Self-pay | Admitting: Cardiology

## 2019-04-13 ENCOUNTER — Telehealth (HOSPITAL_COMMUNITY): Payer: Self-pay | Admitting: Unknown Physician Specialty

## 2019-04-13 DIAGNOSIS — Z7901 Long term (current) use of anticoagulants: Secondary | ICD-10-CM

## 2019-04-13 DIAGNOSIS — T827XXA Infection and inflammatory reaction due to other cardiac and vascular devices, implants and grafts, initial encounter: Secondary | ICD-10-CM

## 2019-04-13 DIAGNOSIS — Z95811 Presence of heart assist device: Secondary | ICD-10-CM

## 2019-04-13 MED ORDER — CEPHALEXIN 500 MG PO CAPS: 500.0000 mg | ORAL_CAPSULE | Freq: Three times a day (TID) | ORAL | 0 refills | Status: DC

## 2019-04-13 NOTE — Telephone Encounter (Signed)
pts HH nurse called the heart failure clinic stating that the pts picc line is out 18 cm. Per Dr. Shirlee Latch pt will need picc exchange. Arranged for pt to have picc changed on Thursday morning at 0900 in IR here at [facility]. Pt was instructed not to use the picc line at this time. Pt was also instructed to start Keflex 500 tid until he gets his new line and can resume IV antibiotics. Pt verbalized understanding of all instructions.   [name] RN, BSN VAD Coordinator 24/7 Pager [phone]

## 2019-04-13 NOTE — Telephone Encounter (Signed)
Received VM from [facility] nurse Lawanna Kobus stating that patients PICC line is 18cm out of insertion site.  Last week it measured 1.5 cm.  Pt called nurse this morning to say that dressing was completely removed during sleep last night.  She assessed him at home to note that dressing was off and PICC line was not in normal position. Per Dr Shirlee Latch, PICC line needs to be removed and replaced as patient is on abx.  Advised VAD coordinator Maralyn Sago of same. She will contact patient with plan.

## 2019-04-14 ENCOUNTER — Ambulatory Visit (HOSPITAL_COMMUNITY)
Admit: 2019-04-14 | Discharge: 2019-04-14 | Disposition: A | Discharge status: Home / Self Care | Payer: Medicare HMO | Source: Ambulatory Visit | Attending: Internal Medicine | Admitting: Internal Medicine

## 2019-04-14 ENCOUNTER — Other Ambulatory Visit: Payer: Self-pay

## 2019-04-14 ENCOUNTER — Ambulatory Visit (HOSPITAL_COMMUNITY): Payer: Self-pay | Admitting: Pharmacist

## 2019-04-14 DIAGNOSIS — Z95811 Presence of heart assist device: Secondary | ICD-10-CM

## 2019-04-14 DIAGNOSIS — B9561 Methicillin susceptible Staphylococcus aureus infection as the cause of diseases classified elsewhere: Secondary | ICD-10-CM

## 2019-04-14 DIAGNOSIS — T827XXA Infection and inflammatory reaction due to other cardiac and vascular devices, implants and grafts, initial encounter: Secondary | ICD-10-CM | POA: Diagnosis present

## 2019-04-14 DIAGNOSIS — Z22321 Carrier or suspected carrier of Methicillin susceptible Staphylococcus aureus: Secondary | ICD-10-CM | POA: Insufficient documentation

## 2019-04-14 DIAGNOSIS — Z7901 Long term (current) use of anticoagulants: Secondary | ICD-10-CM | POA: Insufficient documentation

## 2019-04-14 DIAGNOSIS — Y838 Other surgical procedures as the cause of abnormal reaction of the patient, or of later complication, without mention of misadventure at the time of the procedure: Secondary | ICD-10-CM | POA: Insufficient documentation

## 2019-04-14 DIAGNOSIS — R7881 Bacteremia: Secondary | ICD-10-CM | POA: Diagnosis not present

## 2019-04-14 LAB — BASIC METABOLIC PANEL
Anion gap: 9 (ref 5–15)
BUN: 5 mg/dL — ABNORMAL LOW (ref 6–20)
CO2: 24 mmol/L (ref 22–32)
Calcium: 9.3 mg/dL (ref 8.9–10.3)
Chloride: 109 mmol/L (ref 98–111)
Creatinine, Ser: 0.85 mg/dL (ref 0.61–1.24)
GFR calc Af Amer: >60 mL/min (ref >60)
GFR calc non Af Amer: >60 mL/min (ref >60)
Glucose, Bld: 152 mg/dL — ABNORMAL HIGH (ref 70–99)
Potassium: 3.9 mmol/L (ref 3.5–5.1)
Sodium: 142 mmol/L (ref 135–145)

## 2019-04-14 LAB — CBC
HCT: 34.1 % — ABNORMAL LOW (ref 39.0–52.0)
Hemoglobin: 10.1 g/dL — ABNORMAL LOW (ref 13.0–17.0)
MCH: 23.3 pg — ABNORMAL LOW (ref 26.0–34.0)
MCHC: 29.6 g/dL — ABNORMAL LOW (ref 30.0–36.0)
MCV: 78.8 fL — ABNORMAL LOW (ref 80.0–100.0)
Platelets: 568 10*3/uL — ABNORMAL HIGH (ref 150–400)
RBC: 4.33 MIL/uL (ref 4.22–5.81)
RDW: 16.3 % — ABNORMAL HIGH (ref 11.5–15.5)
WBC: 8.5 10*3/uL (ref 4.0–10.5)
nRBC: 0 % (ref 0.0–0.2)

## 2019-04-14 LAB — PROTIME-INR
INR: 1.8 — ABNORMAL HIGH (ref 0.8–1.2)
Prothrombin Time: 20.4 seconds — ABNORMAL HIGH (ref 11.4–15.2)

## 2019-04-14 LAB — LACTATE DEHYDROGENASE: LDH: 179 U/L (ref 98–192)

## 2019-04-14 MED ORDER — ZOLPIDEM TARTRATE 5 MG PO TABS: 5.0000 mg | ORAL_TABLET | Freq: Every day | ORAL | 0 refills | Status: DC

## 2019-04-14 MED ORDER — ZOLPIDEM TARTRATE 5 MG PO TABS: 5.0000 mg | ORAL_TABLET | Freq: Every evening | ORAL | 0 refills | Status: DC | PRN

## 2019-04-14 MED ORDER — AMLODIPINE BESYLATE 5 MG PO TABS: 5.0000 mg | ORAL_TABLET | Freq: Every day | ORAL | 3 refills | Status: DC

## 2019-04-14 NOTE — Patient Instructions (Signed)
1. Start Amlodipine 5mg  daily 2. Start Ambien 5 mg every night at bedtime 3. Return to VAD clinic in 1 week for wound check with Dr. Donata Clay. 4. Report to the main entrance of the hospital at 0830 in the morning for picc exchange.

## 2019-04-14 NOTE — Addendum Note (Signed)
Encounter addended by: Laurey Morale, MD on: 04/14/2019 2:23 PM  Actions taken: Clinical Note Signed, Charge Capture section accepted, LOS modified

## 2019-04-14 NOTE — Progress Notes (Addendum)
Patient presents for hospital f/u with wound check per Dr. Prescott Gum in [facility] today by himself due to Covid-19 restrictions. Reports no problems with VAD equipment or concerns with drive line. Denies any falls, dizziness, lightheadedness, or syncope.   Pt has taken am meds this morning. Pt is doing well with IV antibiotics. His picc line was accidentally pulled out 18 cm yesterday while sleeping. He is scheduled for a picc line exchange in the morning at 0900. In the interim we have started Keflex 500 mg tid per Dr. [name]. He has follow up w/ID on 05/11/19, IV antibiotics are scheduled to stop 05/16/19.  Vital Signs:  Doppler Pressure:  112 Automatc BP:  108/75 (95)  HR: 95 SPO2:100 % RA  Weight: 186.8 lb w/o eqt Last weight: 186.8lb   VAD Indication: Destination Therapy due to smoking status  VAD interrogation & Equipment Management: Speed: 5700 Flow: 4.4 Power:  4.5w    PI: 4.7 Hct: 33  Alarms: no clinical alarms Events: 5-10 daily Fixed speed: 5700 Low speed limit: 5400   Primary Controller:  Replace back up battery in 23 months. Back up controller:   Replace back up battery in 25 months.  Annual Equipment Maintenance on UBC/PM was performed on 07/2018.   I reviewed the LVAD parameters from today and compared the results to the patient's prior recorded data. LVAD interrogation was NEGATIVE for significant power changes, NEGATIVE for clinical alarms and NEGATIVE for PI events/speed drops. No programming changes were made and pump is functioning within specified parameters. Pt is performing daily controller and system monitor self tests along with completing weekly and monthly maintenance for LVAD equipment.  LVAD equipment check completed and is in good working order. Back-up equipment present.   Exit Site Care: Drive line is being maintained daily by [employer]. VAD coordinator performed dressing change today and reviewed w/Dr.Van Trigt.   Existing dressing removed  using sterile technique. Pt has small amount of yellow/green drainage on the silver strips. Site cleansed with betadine swab x 2. Culture obtained. Site tunnels approx 2 cm on the bottom and 0.5 cm on the top. Small area of proud flesh noted, silver nitrate used on this area. Silver strip packed in tunneled areas. Another silver strip wrapped around driveline w/ dry gauze and occlusive tape. Pt provided with 10 daily dressing kits and 6 anchors. Instructed to continue daily dressing changes. Anchor secure.      Patient Instructions: 1. Start Amlodipine 5 mg daily 2. Start Ambien 5 mg every night at bedtime 3. Return to [place] [facility] in 1 week for wound check with Dr. Prescott Gum. 4. Report to the main entrance of the hospital at 0830 in the morning for picc exchange.   Tanda Rockers RN VAD Coordinator  Office: [phone]  24/7 Pager: [phone]     58 y.o. with history of nonischemic cardiomyopathy, RLE DVT, cirrhosis, smoking, and OSA returns for followup of CHF/LVAD placement.  Cardiomyopathy was diagnosed in 6/19 in [place], [place] at that time showed low output.  He was admitted to [facility] in 9/19 with low output HF and was started on milrinone and diuresed.  Unable to wean off milrinone.  He had a degree of RV failure, but this improved on milrinone.  Valvular heart disease also looked better with milrinone and diuresis.  On 08/03/18, he had Heartmate 3 LVAD placed.  Speed was optimized by ramp echo post-op.  Post-op course was relatively unremarkable.  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  No abscess on CT.  He was started on cefazolin IV for 6 wks.  BP-active meds decreased with low MAP.   He has been doing well since getting home. Decreased driveline drainage.  MAP 95 today.  No exertional dyspnea with his usual activities. PICC has been pulled almost out accidentally.   Labs (9/19): LDH 210, INR  2.17, WBCs 17.1 => 15, hgb 9.7, creatinine 0.72 Labs (10/19): K 4.3, creatinine 0.78 => 0.83, hgb 10.2 Labs (11/19): creatinine 0.79, hgb 10 Labs (2/20): K 3.9, creatinine 0.79 Labs (4/20): K 4, creatinine 1.15, hgb 11.4, LDH 199, INR 1.9 Labs (5/20): hgb 10.4, WBCs 8.5  Denies LVAD alarms.  Denies driveline trauma, erythema or drainage.  Denies ICD shocks.   Reports taking Coumadin as prescribed and adherence to anticoagulation based dietary restrictions.  Denies bright red blood per rectum or melena, no dark urine or hematuria.    PMH: 1. Degenerative disc disease.  2. GERD 3. Chronic systolic CHF:  Nonischemic cardiomyopathy.  Dilated cardiomyopathy diagnosed 6/19 in [place].  LHC/RHC in 7/19 showed elevated filling pressures, low cardiac output, and no significant CAD.   - Echo (9/19): Severe LV dilation with EF 10-20%, moderate-severe MR, severely dilated RV with mildly decreased systolic function, severe TR.  - Cardiac MRI (9/19): EF 14%, moderate dilated LV, severely dilated RV with mod-severe systolic function and EF 76%, nonspecific RV insertion site LGE.  - RHC (5/19) on milrinone 0.375: mean RA 8, PA 40/19, mean PCWP 12, PAPi 2.65, CI 2.71.  - Echo (9/19, on milrinone and diuresed): EF 20-25% with moderate LV dilation, moderately dilated RV with mildly decreased systolic function, mild-moderate MR, moderate TR, PASP 51 mmHg.  Cannot rule out noncompaction.  - Heartmate 3 LVAD placement in 9/19.  4. OSA: CPAP use.  5. Prior smoker 6. Type 2 diabetes 7. Hyperlipidemia.  8. H/o NSVT 9. 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  10. RLE DVT: found in 9/19.  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   Current Outpatient Medications  Medication Sig Dispense Refill  . aspirin EC 81 MG tablet Take 81 mg by mouth daily.    Marland Kitchen atorvastatin (LIPITOR) 20 MG tablet Take 1 tablet (20 mg total) by mouth daily. 30 tablet 6  . carbamide  peroxide (DEBROX) 6.5 % OTIC solution Place 5 drops into both ears 3 (three) times daily. 15 mL 0  . ceFAZolin (ANCEF) IVPB Inject 2 g into the vein every 8 (eight) hours. Indication:  MSSA bacteremia Last Day of Therapy:  05/16/2019 Labs - Once weekly:  CBC/D and BMP, Labs - Every other week:  ESR and CRP 123 Units 0  . cephALEXin (KEFLEX) 500 MG capsule Take 1 capsule (500 mg total) by mouth 3 (three) times daily. 20 capsule 0  . cyanocobalamin 500 MCG tablet Take 1,000 mcg by mouth daily.     . DULoxetine (CYMBALTA) 60 MG capsule Take 60 mg by mouth daily.    . fluticasone (FLONASE) 50 MCG/ACT nasal spray Place 1 spray into both nostrils 2 (two) times daily as needed for allergies.     Marland Kitchen gabapentin (NEURONTIN) 300 MG capsule Take 600 mg by mouth 3 (three) times daily.    Marland Kitchen glucose blood (CONTOUR NEXT TEST) test strip Use as instructed 100 each 12  . hydrOXYzine (VISTARIL) 25 MG capsule Take 25 mg by mouth 3 (three) times daily as needed for  anxiety.    . Ipratropium-Albuterol (COMBIVENT) 20-100 MCG/ACT AERS respimat Inhale 1 puff into the lungs every 6 (six) hours.    . Lancets (FREESTYLE) lancets Use as instructed 100 each 12  . metFORMIN (GLUCOPHAGE-XR) 500 MG 24 hr tablet Take 500 mg by mouth 2 (two) times daily.    . nicotine (NICODERM CQ - DOSED IN MG/24 HR) 7 mg/24hr patch Place 1 patch (7 mg total) onto the skin daily as needed (tobacco craving). 28 patch 0  . nystatin (MYCOSTATIN/NYSTOP) powder Use with VAD dressing changes daily, advance as directed. 15 g 3  . omeprazole (PRILOSEC) 20 MG capsule Take 20 mg by mouth daily.    . sacubitril-valsartan (ENTRESTO) 49-51 MG Take 1 tablet by mouth 2 (two) times daily. 60 tablet 3  . sildenafil (VIAGRA) 100 MG tablet Take 50 mg by mouth daily as needed for erectile dysfunction.    Marland Kitchen spironolactone (ALDACTONE) 25 MG tablet Take 1 tablet (25 mg total) by mouth daily. 30 tablet 6  . thiamine 100 MG tablet Take 1 tablet (100 mg total) by mouth  daily. 30 tablet 0  . traMADol (ULTRAM) 50 MG tablet Take 1 tablet (50 mg total) by mouth every 4 (four) hours as needed for moderate pain. 30 tablet 0  . warfarin (COUMADIN) 5 MG tablet Take 5-7.5 mg by mouth See admin instructions. Take  1 and 1/2 tablets (7.5 mg) Tue, Thur and Sat Take 1 tablet by mouth all other days Or as directed by [facility] clinic    . Zinc Gluconate 100 MG TABS Take 1 tablet (100 mg total) by mouth daily. (Patient taking differently: Take 100 mg by mouth 2 (two) times a day. ) 90 tablet 3  . amLODipine (NORVASC) 5 MG tablet Take 1 tablet (5 mg total) by mouth daily. 180 tablet 3  . docusate sodium (COLACE) 100 MG capsule Take 2 capsules (200 mg total) by mouth daily as needed for mild constipation. (Patient not taking: Reported on 04/14/2019) 10 capsule 0  . methocarbamol (ROBAXIN) 750 MG tablet Take 750 mg by mouth 3 (three) times daily as needed for muscle spasms.     Marland Kitchen oxyCODONE (OXY IR/ROXICODONE) 5 MG immediate release tablet Take 1 - 2 tabs q 4 - 6 hrs prn pain (Patient not taking: Reported on 04/14/2019) 30 tablet 0  . polyethylene glycol (MIRALAX / GLYCOLAX) packet Take 17 g by mouth daily as needed for mild constipation.    Marland Kitchen zolpidem (AMBIEN) 5 MG tablet Take 1 tablet (5 mg total) by mouth at bedtime. 30 tablet 0   No current facility-administered medications for this encounter.     Patient has no known allergies.  REVIEW OF SYSTEMS: All systems negative except as listed in HPI, PMH and Problem list.   LVAD INTERROGATION:  Please see LVAD nurse's note above for details.    I reviewed the LVAD parameters from today, and compared the results to the patient's prior recorded data.  No programming changes were made.  The LVAD is functioning within specified parameters.  The patient performs LVAD self-test daily.  LVAD interrogation was negative for any significant power changes, alarms or PI events/speed drops.  LVAD equipment check completed and is in good working  order.  Back-up equipment present.   LVAD education done on emergency procedures and precautions and reviewed exit site care.    MAP 95  Physical Exam: General: Well appearing this am. NAD.  HEENT: Normal. Neck: Supple, JVP 7-8 cm. Carotids OK.  Cardiac:  Mechanical heart sounds with LVAD hum present.  Lungs:  CTAB, normal effort.  Abdomen:  NT, ND, no HSM. No bruits or masses. +BS  LVAD exit site: Well-healed and incorporated. Dressing dry and intact. No erythema or drainage. Stabilization device present and accurately applied. Driveline dressing changed daily per sterile technique. Extremities:  Warm and dry. No cyanosis, clubbing, rash, or edema.  Neuro:  Alert & oriented x 3. Cranial nerves grossly intact. Moves all 4 extremities w/o difficulty. Affect pleasant       ASSESSMENT AND PLAN:   1. 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  LVAD parameters stable with 5-10 PI events/day.  NYHA class I-II.   He does not look volume overloaded. MAP in 90s now.   - He does not need Lasix.      - Continue spironolactone 25 mg daily.  - Continue Entresto 97/103 bid.   - Restart amlodipine 5 mg daily to help control MAP.  - Continue ASA 81 daily.  - Continue warfarin for INR 2-2.5.  No evidence for GI bleeding. Hgb today is stable.  - Should be transplant candidate eventually if he can stay off cigarettes, he has almost quit. 2. Smoking: Still smoking a couple cigarettes/week. Has cut back a lot using nicotine patches.  Knows he needs to quit to be a transplant candidate and working on it.  3. RLE DVT: On warfarin for LVAD.  4. OSA: Continue CPAP.  5. Hyperlipidemia: Atorvastatin.  6. Type II diabetes: Metformin, per PCP.  7. Driveline infection: Needs total of 6 wks cefazolin for MSSA.  Site is improved.  PICC is almost out, pulled back accidentally.  Will remove and have scheduled it to be replaced tomorrow.  Until then, he will take Keflex instead  of cefazolin.  He has followup with ID.   Loralie Champagne 04/14/2019

## 2019-04-15 ENCOUNTER — Ambulatory Visit (HOSPITAL_COMMUNITY)
Admission: RE | Admit: 2019-04-15 | Discharge: 2019-04-15 | Disposition: A | Discharge status: Home / Self Care | Payer: Medicare HMO | Source: Ambulatory Visit | Attending: Cardiology | Admitting: Cardiology

## 2019-04-15 DIAGNOSIS — Y831 Surgical operation with implant of artificial internal device as the cause of abnormal reaction of the patient, or of later complication, without mention of misadventure at the time of the procedure: Secondary | ICD-10-CM | POA: Insufficient documentation

## 2019-04-15 DIAGNOSIS — T827XXA Infection and inflammatory reaction due to other cardiac and vascular devices, implants and grafts, initial encounter: Secondary | ICD-10-CM | POA: Insufficient documentation

## 2019-04-15 MED ORDER — HEPARIN SOD (PORK) LOCK FLUSH 100 UNIT/ML IV SOLN
INTRAVENOUS | Status: AC
  Filled 2019-04-15: qty 5

## 2019-04-15 MED ORDER — LIDOCAINE HCL (PF) 1 % IJ SOLN
INTRAMUSCULAR | Status: DC | PRN
  Administered 2019-04-15: 5 mL

## 2019-04-15 MED ORDER — LIDOCAINE HCL 1 % IJ SOLN
INTRAMUSCULAR | Status: AC
  Filled 2019-04-15: qty 20

## 2019-04-15 NOTE — Procedures (Signed)
PROCEDURE SUMMARY:  Successful exchange of single lumen PICC line to right basilic vein. Length 38cm Tip at lower SVC/RA No complications PICC capped Ready for use. EBL = trace  Please see full dictation in Imaging section for details.   Selenia Mihok S Reyaansh Merlo PA-C 04/15/2019 9:20 AM

## 2019-04-16 ENCOUNTER — Other Ambulatory Visit (HOSPITAL_COMMUNITY): Payer: Self-pay | Admitting: *Deleted

## 2019-04-16 DIAGNOSIS — Z95811 Presence of heart assist device: Secondary | ICD-10-CM

## 2019-04-16 DIAGNOSIS — Z7901 Long term (current) use of anticoagulants: Secondary | ICD-10-CM

## 2019-04-16 LAB — AEROBIC CULTURE W GRAM STAIN (SUPERFICIAL SPECIMEN): Culture: NO GROWTH

## 2019-04-20 ENCOUNTER — Other Ambulatory Visit (HOSPITAL_COMMUNITY): Payer: Self-pay | Admitting: Unknown Physician Specialty

## 2019-04-20 ENCOUNTER — Telehealth (HOSPITAL_COMMUNITY): Payer: Self-pay | Admitting: Unknown Physician Specialty

## 2019-04-20 DIAGNOSIS — Z7901 Long term (current) use of anticoagulants: Secondary | ICD-10-CM

## 2019-04-20 DIAGNOSIS — Z95811 Presence of heart assist device: Secondary | ICD-10-CM

## 2019-04-20 NOTE — Telephone Encounter (Signed)
Received call from [facility] nurse stating that pts picc line dressing was not secure this morning and that the pt had taped all around the his arm. Upon removing the tape the nurse stated the  picc line was out 5 cm. There was not a previous documentation of where the picc was at the last visit. picc line flushes and draws blood. We will obtain a chest xray at the pts visit tomorrow to check for picc placement. Dr. Shirlee Latch aware and is ok with the pt using the picc line at this time. Pt was informed that he needs to place the tube portion of a sock securely over the picc line at all times. Pt verbalized understanding of these instructions.  [name] RN, BSN VAD Coordinator 24/7 Pager [phone]

## 2019-04-20 NOTE — Addendum Note (Signed)
Addended by: [name] B on: 04/20/2019 12:33 PM   Modules accepted: Orders

## 2019-04-21 ENCOUNTER — Ambulatory Visit (HOSPITAL_COMMUNITY)
Admission: RE | Admit: 2019-04-21 | Discharge: 2019-04-21 | Disposition: A | Discharge status: Home / Self Care | Payer: Medicare HMO | Source: Ambulatory Visit | Attending: Internal Medicine | Admitting: Internal Medicine

## 2019-04-21 ENCOUNTER — Other Ambulatory Visit (HOSPITAL_COMMUNITY): Payer: Self-pay | Admitting: *Deleted

## 2019-04-21 ENCOUNTER — Ambulatory Visit (HOSPITAL_COMMUNITY)
Admission: RE | Admit: 2019-04-21 | Discharge: 2019-04-21 | Disposition: A | Discharge status: Home / Self Care | Payer: Medicare HMO | Source: Ambulatory Visit | Attending: Cardiology | Admitting: Cardiology

## 2019-04-21 ENCOUNTER — Ambulatory Visit (HOSPITAL_COMMUNITY): Payer: Self-pay | Admitting: Pharmacist

## 2019-04-21 ENCOUNTER — Other Ambulatory Visit: Payer: Self-pay

## 2019-04-21 DIAGNOSIS — Z7901 Long term (current) use of anticoagulants: Secondary | ICD-10-CM | POA: Diagnosis present

## 2019-04-21 DIAGNOSIS — Z95811 Presence of heart assist device: Secondary | ICD-10-CM

## 2019-04-21 DIAGNOSIS — Z4800 Encounter for change or removal of nonsurgical wound dressing: Secondary | ICD-10-CM | POA: Diagnosis present

## 2019-04-21 LAB — CBC WITH DIFFERENTIAL/PLATELET
Abs Immature Granulocytes: 0.02 10*3/uL (ref 0.00–0.07)
Basophils Absolute: 0.1 10*3/uL (ref 0.0–0.1)
Basophils Relative: 2 %
Eosinophils Absolute: 0.4 10*3/uL (ref 0.0–0.5)
Eosinophils Relative: 6 %
HCT: 34 % — ABNORMAL LOW (ref 39.0–52.0)
Hemoglobin: 10 g/dL — ABNORMAL LOW (ref 13.0–17.0)
Immature Granulocytes: 0 %
Lymphocytes Relative: 26 %
Lymphs Abs: 1.8 10*3/uL (ref 0.7–4.0)
MCH: 23 pg — ABNORMAL LOW (ref 26.0–34.0)
MCHC: 29.4 g/dL — ABNORMAL LOW (ref 30.0–36.0)
MCV: 78.2 fL — ABNORMAL LOW (ref 80.0–100.0)
Monocytes Absolute: 0.8 10*3/uL (ref 0.1–1.0)
Monocytes Relative: 12 %
Neutro Abs: 3.6 10*3/uL (ref 1.7–7.7)
Neutrophils Relative %: 54 %
Platelets: 304 10*3/uL (ref 150–400)
RBC: 4.35 MIL/uL (ref 4.22–5.81)
RDW: 16.1 % — ABNORMAL HIGH (ref 11.5–15.5)
WBC: 6.8 10*3/uL (ref 4.0–10.5)
nRBC: 0 % (ref 0.0–0.2)

## 2019-04-21 LAB — BASIC METABOLIC PANEL
Anion gap: 11 (ref 5–15)
BUN: 9 mg/dL (ref 6–20)
CO2: 21 mmol/L — ABNORMAL LOW (ref 22–32)
Calcium: 9.1 mg/dL (ref 8.9–10.3)
Chloride: 108 mmol/L (ref 98–111)
Creatinine, Ser: 0.82 mg/dL (ref 0.61–1.24)
GFR calc Af Amer: >60 mL/min (ref >60)
GFR calc non Af Amer: >60 mL/min (ref >60)
Glucose, Bld: 123 mg/dL — ABNORMAL HIGH (ref 70–99)
Potassium: 4.5 mmol/L (ref 3.5–5.1)
Sodium: 140 mmol/L (ref 135–145)

## 2019-04-21 LAB — PROTIME-INR
INR: 2 — ABNORMAL HIGH (ref 0.8–1.2)
Prothrombin Time: 22.5 seconds — ABNORMAL HIGH (ref 11.4–15.2)

## 2019-04-21 LAB — LACTATE DEHYDROGENASE: LDH: 185 U/L (ref 98–192)

## 2019-04-21 NOTE — Progress Notes (Addendum)
Patient presents for dressing change with [facility] check in [facility] today by himself due to Covid-19 restrictions. Reports no problems with VAD equipment or concerns with drive line. Denies any falls, dizziness, lightheadedness, or syncope.   Exit Site Care: Drive line is being maintained daily by [name]. VAD coordinator performed dressing change today and reviewed w/Dr.Van Trigt.   Existing dressing removed using sterile technique. Pt has small amount of yellow/green drainage on the silver strips. Site cleansed with betadine swab x 2. Unable to fit Qtip into small area open around drive line to measure tracking. Silver strip packed under drive line into wound bed.  Another silver strip wrapped around driveline w/ dry gauze and occlusive tape. Pt provided with 10 daily dressing kits. Instructed to continue daily dressing changes. Anchor secure.            Yesterday HHRN called and stated that PICC line was out 5 cm. PICC at the 3rd circle above 0cm at exit site today. No other number for measurement seen on exposed portion of PICC. PICC dressing partially coming off. Per patient HHRN changed yesterday. Dressing removed using sterile technique. Cleansed site with betadine swab and allowed to dry. Placed PICC securement device. Placed biopatch and sterile dressing. Topped dressing with 2 medium tegaderms for extra securement of dressing. Patient using tube portion of sock for securement at all times. Patient sent for chest xray to confirm PICC placement. Per Dr. Shirlee Latch PICC is ok to use based off xray results.   Per Dr. Donata Clay we will see patient back in clinic in 2 weeks for dressing change and wound check.   Alyce Pagan RN VAD Coordinator  Office: [phone]  24/7 Pager: [phone]

## 2019-04-22 ENCOUNTER — Other Ambulatory Visit (HOSPITAL_COMMUNITY): Payer: Self-pay

## 2019-04-22 MED ORDER — SACUBITRIL-VALSARTAN 49-51 MG PO TABS: 1.0000 | ORAL_TABLET | Freq: Two times a day (BID) | ORAL | 11 refills | Status: DC

## 2019-04-29 ENCOUNTER — Other Ambulatory Visit (HOSPITAL_COMMUNITY): Payer: Self-pay | Admitting: *Deleted

## 2019-04-29 DIAGNOSIS — Z7901 Long term (current) use of anticoagulants: Secondary | ICD-10-CM

## 2019-04-29 DIAGNOSIS — Z95811 Presence of heart assist device: Secondary | ICD-10-CM

## 2019-05-05 ENCOUNTER — Other Ambulatory Visit: Payer: Self-pay

## 2019-05-05 ENCOUNTER — Ambulatory Visit (HOSPITAL_COMMUNITY)
Admission: RE | Admit: 2019-05-05 | Discharge: 2019-05-05 | Disposition: A | Discharge status: Home / Self Care | Payer: No Typology Code available for payment source | Source: Ambulatory Visit | Attending: Internal Medicine | Admitting: Internal Medicine

## 2019-05-05 ENCOUNTER — Other Ambulatory Visit (HOSPITAL_COMMUNITY): Payer: Self-pay | Admitting: *Deleted

## 2019-05-05 ENCOUNTER — Ambulatory Visit (HOSPITAL_COMMUNITY): Payer: Self-pay | Admitting: Pharmacist

## 2019-05-05 DIAGNOSIS — T827XXA Infection and inflammatory reaction due to other cardiac and vascular devices, implants and grafts, initial encounter: Secondary | ICD-10-CM

## 2019-05-05 DIAGNOSIS — Z95811 Presence of heart assist device: Secondary | ICD-10-CM

## 2019-05-05 DIAGNOSIS — X58XXXA Exposure to other specified factors, initial encounter: Secondary | ICD-10-CM | POA: Insufficient documentation

## 2019-05-05 DIAGNOSIS — Z7901 Long term (current) use of anticoagulants: Secondary | ICD-10-CM | POA: Diagnosis not present

## 2019-05-05 LAB — CBC WITH DIFFERENTIAL/PLATELET
Abs Immature Granulocytes: 0.03 10*3/uL (ref 0.00–0.07)
Basophils Absolute: 0.1 10*3/uL (ref 0.0–0.1)
Basophils Relative: 1 %
Eosinophils Absolute: 0.4 10*3/uL (ref 0.0–0.5)
Eosinophils Relative: 5 %
HCT: 38.6 % — ABNORMAL LOW (ref 39.0–52.0)
Hemoglobin: 11.4 g/dL — ABNORMAL LOW (ref 13.0–17.0)
Immature Granulocytes: 0 %
Lymphocytes Relative: 23 %
Lymphs Abs: 1.8 10*3/uL (ref 0.7–4.0)
MCH: 23.2 pg — ABNORMAL LOW (ref 26.0–34.0)
MCHC: 29.5 g/dL — ABNORMAL LOW (ref 30.0–36.0)
MCV: 78.5 fL — ABNORMAL LOW (ref 80.0–100.0)
Monocytes Absolute: 1 10*3/uL (ref 0.1–1.0)
Monocytes Relative: 13 %
Neutro Abs: 4.6 10*3/uL (ref 1.7–7.7)
Neutrophils Relative %: 58 %
Platelets: 247 10*3/uL (ref 150–400)
RBC: 4.92 MIL/uL (ref 4.22–5.81)
RDW: 16.8 % — ABNORMAL HIGH (ref 11.5–15.5)
WBC: 7.9 10*3/uL (ref 4.0–10.5)
nRBC: 0 % (ref 0.0–0.2)

## 2019-05-05 LAB — BASIC METABOLIC PANEL
Anion gap: 9 (ref 5–15)
BUN: 13 mg/dL (ref 6–20)
CO2: 24 mmol/L (ref 22–32)
Calcium: 8.8 mg/dL — ABNORMAL LOW (ref 8.9–10.3)
Chloride: 104 mmol/L (ref 98–111)
Creatinine, Ser: 1.06 mg/dL (ref 0.61–1.24)
GFR calc Af Amer: >60 mL/min (ref >60)
GFR calc non Af Amer: >60 mL/min (ref >60)
Glucose, Bld: 131 mg/dL — ABNORMAL HIGH (ref 70–99)
Potassium: 4.2 mmol/L (ref 3.5–5.1)
Sodium: 137 mmol/L (ref 135–145)

## 2019-05-05 LAB — SEDIMENTATION RATE: Sed Rate: 4 mm/hr (ref 0–16)

## 2019-05-05 LAB — LACTATE DEHYDROGENASE: LDH: 208 U/L — ABNORMAL HIGH (ref 98–192)

## 2019-05-05 LAB — PROTIME-INR
INR: 1.6 — ABNORMAL HIGH (ref 0.8–1.2)
Prothrombin Time: 18.7 seconds — ABNORMAL HIGH (ref 11.4–15.2)

## 2019-05-05 LAB — C-REACTIVE PROTEIN: CRP: <0.8 mg/dL (ref <1.0)

## 2019-05-05 NOTE — Progress Notes (Signed)
Patient presents for dressing change with wound check in [facility] today by himself due to Covid-19 restrictions. Reports no problems with VAD equipment or concerns with drive line. Denies any falls, dizziness, lightheadedness, or syncope.   Exit Site Care: Drive line is being maintained daily by [employer]. VAD coordinator performed dressing change today and reviewed w/Dr.Van Trigt.   Existing dressing removed using sterile technique. Pt has small amount of yellow/green drainage on the silver strips. Site cleansed with betadine swab x 2.  Silver strip wrapped around driveline w/ dry gauze and occlusive tape. Pt provided with 14 daily dressing kits, a box of betadine swabs, a box of adhesive remover, and 10 anchors. Instructed to continue daily dressing changes. Anchor secure.  Wound culture sent today. Per Dr. Prescott Gum we will see patient back in clinic in 2 weeks for dressing change and wound check.   Patient has an appointment with Colletta [place] ID 05/11/19.   Emerson Monte RN [place] Coordinator  Office: [phone]  24/7 Pager: [phone]

## 2019-05-07 LAB — AEROBIC CULTURE W GRAM STAIN (SUPERFICIAL SPECIMEN): Culture: NO GROWTH

## 2019-05-10 ENCOUNTER — Telehealth: Payer: Self-pay | Admitting: Infectious Diseases

## 2019-05-10 NOTE — Progress Notes (Signed)
Duplicate

## 2019-05-10 NOTE — Telephone Encounter (Signed)
COVID-19 Pre-Screening Questions: ° °Do you currently have a fever (>100 °F), chills or unexplained body aches? No  ° °Are you currently experiencing new cough, shortness of breath, sore throat, runny nose? No  °•  °Have you recently travelled outside the state of [place] in the last 14 days? No  °•  °1. Have you been in contact with someone that is currently pending confirmation of Covid19 testing or has been confirmed to have the Covid19 virus?  No  ° °

## 2019-05-11 ENCOUNTER — Other Ambulatory Visit: Payer: Self-pay

## 2019-05-11 ENCOUNTER — Ambulatory Visit (INDEPENDENT_AMBULATORY_CARE_PROVIDER_SITE_OTHER): Payer: Medicare HMO | Admitting: Infectious Diseases

## 2019-05-11 ENCOUNTER — Telehealth: Payer: Self-pay

## 2019-05-11 DIAGNOSIS — R7881 Bacteremia: Secondary | ICD-10-CM

## 2019-05-11 DIAGNOSIS — B9561 Methicillin susceptible Staphylococcus aureus infection as the cause of diseases classified elsewhere: Secondary | ICD-10-CM

## 2019-05-11 DIAGNOSIS — T827XXA Infection and inflammatory reaction due to other cardiac and vascular devices, implants and grafts, initial encounter: Secondary | ICD-10-CM

## 2019-05-11 MED ORDER — CEPHALEXIN 500 MG PO CAPS: 500.0000 mg | ORAL_CAPSULE | Freq: Three times a day (TID) | ORAL | 2 refills | Status: DC

## 2019-05-11 NOTE — Telephone Encounter (Signed)
Per Janene Madeira, Np called [facility] with verbal order to pull patients picc. Spoke with Amy who was able to take verbal order. [facility] will have home health nurse pull picc today Aundria Rud, [place]

## 2019-05-11 NOTE — Patient Instructions (Signed)
So nice to see you doing well today!  We will call your home health team to have your PICC line removed.   Will will transition you to antibiotics by mouth - this is called keflex and will need to be taken one pill three times a day for now.   Will see you back in 3 months here - if you have any trouble or change in your driveline please either call our office or ask your VAD team to give me a call.

## 2019-05-12 ENCOUNTER — Encounter: Payer: Self-pay | Admitting: Infectious Diseases

## 2019-05-12 NOTE — Assessment & Plan Note (Signed)
No signs of recurrent bacteremia on exam today and he appears to be recovering nicely on treatment with IV Cefazolin. He has nearly completed 6 weeks, inflammatory markers have normalized and he is doing very well clinically with regards to his wound. 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 We did not do a TEE in the hospital as they are not sensitive for VAD related endocarditis and there is no need for surgical planning at this point. The goal from what I understand it will be to have him abstain from cigarettes for 6 months and then have him evaluated for heart transplant. Will suppress with chronic cephalexin in hopes he will be a candidate.  He is in agreement with this plan and can handle TID dosing. May in the future consider BID dosing if he tolerates this well.  He can follow up in 3 months.

## 2019-05-12 NOTE — Progress Notes (Signed)
Patient: Theodore Welch  DOB: 1961-10-15 MRN: [id number] PCP: Reubin Milan, MD    Patient Active Problem List   Diagnosis Date Noted  . MSSA bacteremia 04/01/2019    Priority: High  . Infection associated with driveline of left ventricular assist device (LVAD) ([place]) 11/28/2018    Priority: High  . Chronic systolic heart failure ([place]) 11/28/2018  . Complication involving left ventricular assist device (LVAD) 11/23/2018  . OSA (obstructive sleep apnea) 09/02/2018  . LVAD (left ventricular assist device) present ([place]) 08/03/2018  . DVT (deep venous thrombosis) ([place]) 08/02/2018  . Goals of care, counseling/discussion   . Advance care planning   . Palliative care encounter   . Malnutrition of moderate degree 07/25/2018  . Ascites   . COPD (chronic obstructive pulmonary disease) ([place]) 07/21/2018  . HTN (hypertension) 07/21/2018  . Tobacco use 07/21/2018  . Congestive heart failure ([place]) 07/21/2018  . CHF (congestive heart failure) ([place]) 07/21/2018  . Type 2 diabetes mellitus ([place]) 07/21/2018  . Hyperlipidemia 07/21/2018  . Fibromyalgia syndrome 01/27/2017  . Myofascial pain syndrome, cervical 01/27/2017  . Lumbar radiculopathy 01/24/2014  . History of lumbar laminectomy for spinal cord decompression 12/07/2013  . 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     Subjective:  CC: Hospital follow up MSSA bacteremia and driveline infection. Feeling very well and hopeful PICC can come out.    Brief ID Hx: Theodore Welch is a 58 y.o. male with LVAD for end stage heart failure with history of MSSA bacteremia in the setting of infection of his driveline exitsite. He has required surgical debridement for deep cutaneous abscess tracking along power cord in the past and infection involving the counter incision after initial implant. Admitted May 2020 with recurrent MSSA infection and secondary bacteremia. Did not perform TEE as he is not presently a  surgical candidate for explant and hopes to go on to be a candidate for heart transplant.   HPI: Since hospital discharge he has been doing very well and recovered nicely. His wife has helped with infusions of the cefazolin and he has missed no doses. No concern for side effect to medication and has been tolerating this very well. Denies any fevers or chills. He says that he has been working with the [facility] regularly for wound care and has had a few superficial cultures collected that have had nothing growing. He is now advanced back to regular Aquacel strip without packing and every other day dressing changes to start soon. He states that there has been no drainage and he is having no tenderness at the site or to the upper abdomen as he was having in the hospital.   PICC line is without pain, drainage or erythema and is well maintained by [facility] Team. No swelling or altered sensation in affected distal extremity.   Review of Systems  Constitutional: Negative for chills, fever, malaise/fatigue and weight loss.  HENT: Negative for sore throat.        No dental problems  Respiratory: Negative for cough and sputum production.   Cardiovascular: Negative for chest pain and leg swelling.  Gastrointestinal: Negative for abdominal pain, diarrhea and vomiting.  Genitourinary: Negative for dysuria and flank pain.  Musculoskeletal: Negative for joint pain, myalgias and neck pain.  Skin: Negative for rash.  Neurological: Negative for dizziness, tingling and headaches.  Psychiatric/Behavioral: Negative for depression and substance abuse. The patient is not nervous/anxious and does not have insomnia.  Past Medical History:  Diagnosis Date  . Cardiomyopathy, unspecified ([place])   . CHF (congestive heart failure) ([place])   . Chronic back pain   . Enlarged heart   . Gastric ulcer   . Gastroenteritis   . H/O degenerative disc disease     Outpatient Medications Prior to Visit  Medication Sig  Dispense Refill  . amLODipine (NORVASC) 5 MG tablet Take 1 tablet (5 mg total) by mouth daily. 180 tablet 3  . aspirin EC 81 MG tablet Take 81 mg by mouth daily.    Marland Kitchen atorvastatin (LIPITOR) 20 MG tablet Take 1 tablet (20 mg total) by mouth daily. 30 tablet 6  . carbamide peroxide (DEBROX) 6.5 % OTIC solution Place 5 drops into both ears 3 (three) times daily. 15 mL 0  . ceFAZolin (ANCEF) IVPB Inject 2 g into the vein every 8 (eight) hours. Indication:  MSSA bacteremia Last Day of Therapy:  05/16/2019 Labs - Once weekly:  CBC/D and BMP, Labs - Every other week:  ESR and CRP 123 Units 0  . cyanocobalamin 500 MCG tablet Take 1,000 mcg by mouth daily.     Marland Kitchen docusate sodium (COLACE) 100 MG capsule Take 2 capsules (200 mg total) by mouth daily as needed for mild constipation. 10 capsule 0  . DULoxetine (CYMBALTA) 60 MG capsule Take 60 mg by mouth daily.    . fluticasone (FLONASE) 50 MCG/ACT nasal spray Place 1 spray into both nostrils 2 (two) times daily as needed for allergies.     Marland Kitchen gabapentin (NEURONTIN) 300 MG capsule Take 600 mg by mouth 3 (three) times daily.    Marland Kitchen glucose blood (CONTOUR NEXT TEST) test strip Use as instructed 100 each 12  . hydrOXYzine (VISTARIL) 25 MG capsule Take 25 mg by mouth 3 (three) times daily as needed for anxiety.    . Ipratropium-Albuterol (COMBIVENT) 20-100 MCG/ACT AERS respimat Inhale 1 puff into the lungs every 6 (six) hours.    . Lancets (FREESTYLE) lancets Use as instructed 100 each 12  . metFORMIN (GLUCOPHAGE-XR) 500 MG 24 hr tablet Take 500 mg by mouth 2 (two) times daily.    . methocarbamol (ROBAXIN) 750 MG tablet Take 750 mg by mouth 3 (three) times daily as needed for muscle spasms.     . nicotine (NICODERM CQ - DOSED IN MG/24 HR) 7 mg/24hr patch Place 1 patch (7 mg total) onto the skin daily as needed (tobacco craving). 28 patch 0  . nystatin (MYCOSTATIN/NYSTOP) powder Use with VAD dressing changes daily, advance as directed. 15 g 3  . omeprazole (PRILOSEC)  20 MG capsule Take 20 mg by mouth daily.    Marland Kitchen oxyCODONE (OXY IR/ROXICODONE) 5 MG immediate release tablet Take 1 - 2 tabs q 4 - 6 hrs prn pain 30 tablet 0  . polyethylene glycol (MIRALAX / GLYCOLAX) packet Take 17 g by mouth daily as needed for mild constipation.    . sacubitril-valsartan (ENTRESTO) 49-51 MG Take 1 tablet by mouth 2 (two) times daily. 60 tablet 11  . sildenafil (VIAGRA) 100 MG tablet Take 50 mg by mouth daily as needed for erectile dysfunction.    Marland Kitchen spironolactone (ALDACTONE) 25 MG tablet Take 1 tablet (25 mg total) by mouth daily. 30 tablet 6  . thiamine 100 MG tablet Take 1 tablet (100 mg total) by mouth daily. 30 tablet 0  . traMADol (ULTRAM) 50 MG tablet Take 1 tablet (50 mg total) by mouth every 4 (four) hours as needed for moderate pain. [place]  tablet 0  . warfarin (COUMADIN) 5 MG tablet Take 5-7.5 mg by mouth See admin instructions. Take  1 and 1/2 tablets (7.5 mg) Tue, Thur and Sat Take 1 tablet by mouth all other days Or as directed by AHF clinic    . Zinc Gluconate 100 MG TABS Take 1 tablet (100 mg total) by mouth daily. (Patient taking differently: Take 100 mg by mouth 2 (two) times a day. ) 90 tablet 3  . zolpidem (AMBIEN) 5 MG tablet Take 1 tablet (5 mg total) by mouth at bedtime. 30 tablet 0  . cephALEXin (KEFLEX) 500 MG capsule Take 1 capsule (500 mg total) by mouth 3 (three) times daily. 20 capsule 0   No facility-administered medications prior to visit.      No Known Allergies  Social History   Tobacco Use  . Smoking status: Former Smoker    Types: Cigarettes    Start date: 2003  . Smokeless tobacco: Never Used  . Tobacco comment: Nicoderm patch   Substance Use Topics  . Alcohol use: Not Currently  . Drug use: Not Currently    No family history on file.  Objective:   Vitals:   05/11/19 0903  Temp: (!) 97.4 F (36.3 C)  TempSrc: Oral  Weight: 185 lb (83.9 kg)   Body mass index is 26.54 kg/m.  Physical Exam Vitals signs reviewed.   Constitutional:      Appearance: He is well-developed.     Comments: Seated comfortably in chair during visit. Pleasant and in good spirits. Well-appearing.   HENT:     Mouth/Throat:     Dentition: Normal dentition. No dental abscesses.  Cardiovascular:     Rate and Rhythm: Normal rate and regular rhythm.     Comments: LVAD humm, no native heart tones heard.  Pulmonary:     Effort: Pulmonary effort is normal.     Breath sounds: Normal breath sounds.  Abdominal:     Palpations: Abdomen is soft.     Tenderness: There is no abdominal tenderness.     Comments: Exit site clean and dry with no drainage noted on bandage.   Lymphadenopathy:     Cervical: No cervical adenopathy.  Skin:    General: Skin is warm and dry.     Findings: No rash.  Neurological:     Mental Status: He is alert and oriented to person, place, and time.  Psychiatric:        Judgment: Judgment normal.     Comments: In good spirits today and engaged in care discussion.      Lab Results: Lab Results  Component Value Date   WBC 7.9 05/05/2019   HGB 11.4 (L) 05/05/2019   HCT 38.6 (L) 05/05/2019   MCV 78.5 (L) 05/05/2019   PLT 247 05/05/2019    Lab Results  Component Value Date   CREATININE 1.06 05/05/2019   BUN 13 05/05/2019   NA 137 05/05/2019   K 4.2 05/05/2019   CL 104 05/05/2019   CO2 24 05/05/2019    Sed Rate (mm/hr)  Date Value  05/05/2019 4   CRP (mg/dL)  Date Value  05/05/2019 <0.8     Assessment & Plan:   Problem List Items Addressed This Visit      High   Infection associated with driveline of left ventricular assist device (LVAD) ([place])    VAD team notes reviewed - he has had good tissue ingrowth during treatment course for infection and now no longer has space to  pack. Very little to no drainage noted on Aquacel strips. No tenderness on exam - did not assess site today as I have no dressing kits in the office.  Exitsite care per VAD team.       Relevant Medications   cephALEXin  (KEFLEX) 500 MG capsule   MSSA bacteremia    No signs of recurrent bacteremia on exam today and he appears to be recovering nicely on treatment with IV Cefazolin. He has nearly completed 6 weeks, inflammatory markers have normalized and he is doing very well clinically with regards to his wound. 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 We did not do a TEE in the hospital as they are not sensitive for VAD related endocarditis and there is no need for surgical planning at this point. The goal from what I understand it will be to have him abstain from cigarettes for 6 months and then have him evaluated for heart transplant. Will suppress with chronic cephalexin in hopes he will be a candidate.  He is in agreement with this plan and can handle TID dosing. May in the future consider BID dosing if he tolerates this well.  He can follow up in 3 months.          Janene Madeira, MSN, NP-C [facility] [email or web address] Pager: [phone] Office: [phone] [place]: [phone]

## 2019-05-12 NOTE — Assessment & Plan Note (Signed)
VAD team notes reviewed - he has had good tissue ingrowth during treatment course for infection and now no longer has space to pack. Very little to no drainage noted on Aquacel strips. No tenderness on exam - did not assess site today as I have no dressing kits in the office.  Exitsite care per VAD team.

## 2019-05-18 ENCOUNTER — Telehealth (HOSPITAL_COMMUNITY): Payer: Self-pay

## 2019-05-18 ENCOUNTER — Other Ambulatory Visit (HOSPITAL_COMMUNITY): Payer: Self-pay | Admitting: *Deleted

## 2019-05-18 DIAGNOSIS — I5043 Acute on chronic combined systolic (congestive) and diastolic (congestive) heart failure: Secondary | ICD-10-CM

## 2019-05-18 DIAGNOSIS — T827XXA Infection and inflammatory reaction due to other cardiac and vascular devices, implants and grafts, initial encounter: Secondary | ICD-10-CM

## 2019-05-18 DIAGNOSIS — Z7901 Long term (current) use of anticoagulants: Secondary | ICD-10-CM

## 2019-05-18 NOTE — Telephone Encounter (Signed)
COVID-19 pre-appointment screening questions:   Do you have a history of COVID-19 or a positive test result in the past 7-10 days? No  To the best of your knowledge, have you been in close contact with anyone with a confirmed diagnosis of COVID 19? No  Have you had any one or more of the following: Fever, chills, cough, shortness of breath (out of the normal for you) or any flu-like symptoms? No  Are you experiencing any of the following symptoms that is new or out of usual for you: No  . Ear, nose or throat discomfort . Sore throat . Headache . Muscle Pain . Diarrhea . Loss of taste or smell   Reviewed all the following with patient: . Use of hand sanitizer when entering the building . Everyone is required to wear a mask in the building, if you do not have a mask we are happy to provide you with one when you arrive . NO Visitor guidelines   If patient answers YES to any of questions they must change to a virtual visit and place note in comments about symptoms  

## 2019-05-19 ENCOUNTER — Other Ambulatory Visit: Payer: Self-pay

## 2019-05-19 ENCOUNTER — Encounter (HOSPITAL_COMMUNITY): Payer: Self-pay

## 2019-05-19 ENCOUNTER — Ambulatory Visit (HOSPITAL_COMMUNITY)
Admission: RE | Admit: 2019-05-19 | Discharge: 2019-05-19 | Disposition: A | Discharge status: Home / Self Care | Payer: No Typology Code available for payment source | Source: Ambulatory Visit | Attending: Cardiology | Admitting: Cardiology

## 2019-05-19 ENCOUNTER — Ambulatory Visit (HOSPITAL_COMMUNITY): Payer: Self-pay | Admitting: Pharmacist

## 2019-05-19 VITALS — BP 100/67 | HR 82 | Temp 98.5°F | Ht 71.0 in | Wt 191.2 lb

## 2019-05-19 DIAGNOSIS — Z7984 Long term (current) use of oral hypoglycemic drugs: Secondary | ICD-10-CM | POA: Diagnosis not present

## 2019-05-19 DIAGNOSIS — I5043 Acute on chronic combined systolic (congestive) and diastolic (congestive) heart failure: Secondary | ICD-10-CM | POA: Diagnosis not present

## 2019-05-19 DIAGNOSIS — Y838 Other surgical procedures as the cause of abnormal reaction of the patient, or of later complication, without mention of misadventure at the time of the procedure: Secondary | ICD-10-CM | POA: Insufficient documentation

## 2019-05-19 DIAGNOSIS — K219 Gastro-esophageal reflux disease without esophagitis: Secondary | ICD-10-CM | POA: Insufficient documentation

## 2019-05-19 DIAGNOSIS — Z7982 Long term (current) use of aspirin: Secondary | ICD-10-CM | POA: Diagnosis not present

## 2019-05-19 DIAGNOSIS — I5022 Chronic systolic (congestive) heart failure: Secondary | ICD-10-CM | POA: Diagnosis not present

## 2019-05-19 DIAGNOSIS — Z95811 Presence of heart assist device: Secondary | ICD-10-CM | POA: Insufficient documentation

## 2019-05-19 DIAGNOSIS — A4901 Methicillin susceptible Staphylococcus aureus infection, unspecified site: Secondary | ICD-10-CM | POA: Insufficient documentation

## 2019-05-19 DIAGNOSIS — Z7901 Long term (current) use of anticoagulants: Secondary | ICD-10-CM

## 2019-05-19 DIAGNOSIS — E785 Hyperlipidemia, unspecified: Secondary | ICD-10-CM | POA: Insufficient documentation

## 2019-05-19 DIAGNOSIS — Z86718 Personal history of other venous thrombosis and embolism: Secondary | ICD-10-CM | POA: Diagnosis not present

## 2019-05-19 DIAGNOSIS — T827XXA Infection and inflammatory reaction due to other cardiac and vascular devices, implants and grafts, initial encounter: Secondary | ICD-10-CM | POA: Diagnosis present

## 2019-05-19 DIAGNOSIS — Z79899 Other long term (current) drug therapy: Secondary | ICD-10-CM | POA: Insufficient documentation

## 2019-05-19 DIAGNOSIS — E119 Type 2 diabetes mellitus without complications: Secondary | ICD-10-CM | POA: Diagnosis not present

## 2019-05-19 DIAGNOSIS — I420 Dilated cardiomyopathy: Secondary | ICD-10-CM | POA: Insufficient documentation

## 2019-05-19 DIAGNOSIS — F17210 Nicotine dependence, cigarettes, uncomplicated: Secondary | ICD-10-CM | POA: Diagnosis not present

## 2019-05-19 DIAGNOSIS — K7460 Unspecified cirrhosis of liver: Secondary | ICD-10-CM | POA: Insufficient documentation

## 2019-05-19 DIAGNOSIS — G4733 Obstructive sleep apnea (adult) (pediatric): Secondary | ICD-10-CM | POA: Diagnosis not present

## 2019-05-19 LAB — PROTIME-INR
INR: 2.5 — ABNORMAL HIGH (ref 0.8–1.2)
Prothrombin Time: 26.3 seconds — ABNORMAL HIGH (ref 11.4–15.2)

## 2019-05-19 LAB — CBC
HCT: 38.3 % — ABNORMAL LOW (ref 39.0–52.0)
Hemoglobin: 11.2 g/dL — ABNORMAL LOW (ref 13.0–17.0)
MCH: 23.3 pg — ABNORMAL LOW (ref 26.0–34.0)
MCHC: 29.2 g/dL — ABNORMAL LOW (ref 30.0–36.0)
MCV: 79.6 fL — ABNORMAL LOW (ref 80.0–100.0)
Platelets: 343 10*3/uL (ref 150–400)
RBC: 4.81 MIL/uL (ref 4.22–5.81)
RDW: 17.2 % — ABNORMAL HIGH (ref 11.5–15.5)
WBC: 10.5 10*3/uL (ref 4.0–10.5)
nRBC: 0 % (ref 0.0–0.2)

## 2019-05-19 LAB — BASIC METABOLIC PANEL
Anion gap: 10 (ref 5–15)
BUN: 9 mg/dL (ref 6–20)
CO2: 24 mmol/L (ref 22–32)
Calcium: 8.9 mg/dL (ref 8.9–10.3)
Chloride: 104 mmol/L (ref 98–111)
Creatinine, Ser: 0.96 mg/dL (ref 0.61–1.24)
GFR calc Af Amer: >60 mL/min (ref >60)
GFR calc non Af Amer: >60 mL/min (ref >60)
Glucose, Bld: 138 mg/dL — ABNORMAL HIGH (ref 70–99)
Potassium: 3.7 mmol/L (ref 3.5–5.1)
Sodium: 138 mmol/L (ref 135–145)

## 2019-05-19 LAB — LACTATE DEHYDROGENASE: LDH: 202 U/L — ABNORMAL HIGH (ref 98–192)

## 2019-05-19 NOTE — Progress Notes (Addendum)
Patient presents for 2 mo f/u and wound check per Dr. Donata ClayVan Trigt in VAD Clinic today by himself due to Covid-19 restrictions. Reports no problems with VAD equipment or concerns with drive line. Denies any falls, dizziness, lightheadedness, or syncope.   Pt says he has been feeling fine, has no limitations with his daily activities. He is gaining weight from eating more. Says his blood sugars are well controlled and have ranged from 110 - 126 over last few weeks.  He saw Rexene AlbertsStephanie Dixon, NP in Infection Control on 04/14/19. His IV antibiotics were stopped, [facility], and he was started on Keflex 500 mg tid. He says his wife has continued daily dressing changes using gauze dressing with silver strip.   Vital Signs:  Temp:  98.5 Doppler Pressure:  90 Automatc BP:  100/67 (86) HR:  82 SPO2:99 % RA  Weight: 191.2 lb w/o eqt Last weight: 186.8 lb   VAD Indication: Destination Therapy due to smoking status  VAD interrogation & Equipment Management: Speed: 5700 Flow:  4.7 Power:  4.3w    PI: 3.3 Hct: 33  Alarms: no clinical alarms Events:  10 - 20 PI events daily Fixed speed: 5700 Low speed limit: 5400  Primary Controller:  Replace back up battery in 21 months. Back up controller:   Replace back up battery in 23 months.  Annual Equipment Maintenance on UBC/PM was performed on 07/2018.   I reviewed the LVAD parameters from today and compared the results to the patient's prior recorded data. LVAD interrogation was NEGATIVE for significant power changes, NEGATIVE for clinical alarms and NEGATIVE for PI events/speed drops. No programming changes were made and pump is functioning within specified parameters. Pt is performing daily controller and system monitor self tests along with completing weekly and monthly maintenance for LVAD equipment.  LVAD equipment check completed and is in good working order. Back-up equipment present.   Exit Site Care: Drive line is being  maintained daily by [employer]. Gauze dressing with anchor intact and accurately applied. Dr. Maren BeachVanTrigt performed dressing change today. Existing dressing removed using sterile technique. Pt has small amount of yellow/green drainage on the silver strips with no foul odor, rash, or tenderness noted. Exit site with complete tissue ingrowth, no tunneling noted. Pt provided with 7 daily dressing kits and 5 anchors. Instructed to continue daily dressing changes.  Patient Instructions: 1. Continue daily dressing changes. 2. Return for DL check in 2 weeks. 3. Return for full visit in 2 months. 4. PharmD will call you with INR results and coumadin dosing.   Hessie DienerMolly Reece RN VAD Coordinator  Office: [phone]  24/7 Pager: [phone]    58 y.o. with history of nonischemic cardiomyopathy, RLE DVT, cirrhosis, smoking, and OSA returns for followup of CHF/LVAD placement.  [place].  [facility] low output HF and was started on milrinone and diuresed.  Unable to wean off milrinone.  He had a degree of RV failure, but this improved on milrinone.  Valvular heart disease also looked better with milrinone and diuresis.  On 08/03/18, he had Heartmate 3 LVAD placed.  Speed was optimized by ramp echo post-op.  Post-op course was relatively unremarkable.  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  No abscess on CT.  He was started on cefazolin IV for 6  wks.  BP-active meds decreased with low MAP.   He is now on chronic Keflex for MSSA suppression.  Weight is stable.  MAP excellent today. No significant exertional dyspnea.  Driveline site looks better, no tunneling.  No BRBPR, melena.  No lightheadedness. Smoking "1/2" cigarette/day.   Labs (9/19): LDH 210, INR 2.17, WBCs 17.1 => 15, hgb 9.7, creatinine 0.72 Labs (10/19): K 4.3,  creatinine 0.78 => 0.83, hgb 10.2 Labs (11/19): creatinine 0.79, hgb 10 Labs (2/20): K 3.9, creatinine 0.79 Labs (4/20): K 4, creatinine 1.15, hgb 11.4, LDH 199, INR 1.9 Labs (5/20): hgb 10.4, WBCs 8.5 Labs (6/20): K 4.2, creatinine 1.06, hgb 11.2  Denies LVAD alarms.  Denies driveline trauma, erythema or drainage.  Denies ICD shocks.   Reports taking Coumadin as prescribed and adherence to anticoagulation based dietary restrictions.  Denies bright red blood per rectum or melena, no dark urine or hematuria.    PMH: 1. Degenerative disc disease.  2. GERD 3. Chronic systolic CHF:  Nonischemic cardiomyopathy.  Dilated cardiomyopathy diagnosed 6/19 in [place].  LHC/RHC in 7/19 showed elevated filling pressures, low cardiac output, and no significant CAD.   - Echo (9/19): Severe LV dilation with EF 10-20%, moderate-severe MR, severely dilated RV with mildly decreased systolic function, severe TR.  - Cardiac MRI (9/19): EF 14%, moderate dilated LV, severely dilated RV with mod-severe systolic function and EF 25%, nonspecific RV insertion site LGE.  - RHC (5/19) on milrinone 0.375: mean RA 8, PA 40/19, mean PCWP 12, PAPi 2.65, CI 2.71.  - Echo (9/19, on milrinone and diuresed): EF 20-25% with moderate LV dilation, moderately dilated RV with mildly decreased systolic function, mild-moderate MR, moderate TR, PASP 51 mmHg.  Cannot rule out noncompaction.  - Heartmate 3 LVAD placement in 9/19.  4. OSA: CPAP use.  5. Prior smoker 6. Type 2 diabetes 7. Hyperlipidemia.  8. H/o NSVT 9. 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  10. RLE DVT: found in 9/19.  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   Current Outpatient Medications  Medication Sig Dispense Refill  . amLODipine (NORVASC) 5 MG tablet Take 1 tablet (5 mg total) by mouth daily. 180 tablet 3  . aspirin EC 81 MG tablet Take 81 mg by mouth daily.    Marland Kitchen atorvastatin (LIPITOR) 20 MG tablet  Take 1 tablet (20 mg total) by mouth daily. 30 tablet 6  . carbamide peroxide (DEBROX) 6.5 % OTIC solution Place 5 drops into both ears 3 (three) times daily. 15 mL 0  . cephALEXin (KEFLEX) 500 MG capsule Take 1 capsule (500 mg total) by mouth 3 (three) times daily. 90 capsule 2  . cyanocobalamin 500 MCG tablet Take 1,000 mcg by mouth daily.     Marland Kitchen docusate sodium (COLACE) 100 MG capsule Take 2 capsules (200 mg total) by mouth daily as needed for mild constipation. 10 capsule 0  . DULoxetine (CYMBALTA) 60 MG capsule Take 60 mg by mouth daily.    . fluticasone (FLONASE) 50 MCG/ACT nasal spray Place 1 spray into both nostrils 2 (two) times daily as needed for allergies.     Marland Kitchen gabapentin (NEURONTIN) 300 MG capsule Take 600 mg by mouth 3 (three) times daily.    Marland Kitchen glucose blood (CONTOUR NEXT TEST) test strip Use as instructed 100 each 12  . hydrOXYzine (VISTARIL) 25 MG capsule Take 25 mg by mouth 3 (three) times daily as needed for anxiety.    . Ipratropium-Albuterol (COMBIVENT) 20-100 MCG/ACT  AERS respimat Inhale 1 puff into the lungs every 6 (six) hours.    . Lancets (FREESTYLE) lancets Use as instructed 100 each 12  . metFORMIN (GLUCOPHAGE-XR) 500 MG 24 hr tablet Take 500 mg by mouth 2 (two) times daily.    . methocarbamol (ROBAXIN) 750 MG tablet Take 750 mg by mouth 3 (three) times daily as needed for muscle spasms.     Marland Kitchen. nystatin (MYCOSTATIN/NYSTOP) powder Use with VAD dressing changes daily, advance as directed. 15 g 3  . omeprazole (PRILOSEC) 20 MG capsule Take 20 mg by mouth daily.    . polyethylene glycol (MIRALAX / GLYCOLAX) packet Take 17 g by mouth daily as needed for mild constipation.    . sacubitril-valsartan (ENTRESTO) 49-51 MG Take 1 tablet by mouth 2 (two) times daily. 60 tablet 11  . sildenafil (VIAGRA) 100 MG tablet Take 50 mg by mouth daily as needed for erectile dysfunction.    Marland Kitchen. spironolactone (ALDACTONE) 25 MG tablet Take 1 tablet (25 mg total) by mouth daily. 30 tablet 6  .  thiamine 100 MG tablet Take 1 tablet (100 mg total) by mouth daily. 30 tablet 0  . traMADol (ULTRAM) 50 MG tablet Take 1 tablet (50 mg total) by mouth every 4 (four) hours as needed for moderate pain. 30 tablet 0  . warfarin (COUMADIN) 5 MG tablet Take 5-7.5 mg by mouth See admin instructions. Take  1 and 1/2 tablets (7.5 mg) Tue, Thur and Sat Take 1 tablet by mouth all other days Or as directed by A[facility] clinic    . Zinc Gluconate 100 MG TABS Take 1 tablet (100 mg total) by mouth daily. (Patient taking differently: Take 100 mg by mouth 2 (two) times a day. ) 90 tablet 3  . zolpidem (AMBIEN) 5 MG tablet Take 1 tablet (5 mg total) by mouth at bedtime. 30 tablet 0  . nicotine (NICODERM CQ - DOSED IN MG/24 HR) 7 mg/24hr patch Place 1 patch (7 mg total) onto the skin daily as needed (tobacco craving). (Patient not taking: Reported on 05/19/2019) 28 patch 0   No current facility-administered medications for this encounter.     Patient has no known allergies.  REVIEW OF SYSTEMS: All systems negative except as listed in HPI, PMH and Problem list.   LVAD INTERROGATION:  Please see LVAD nurse's note above for details.    I reviewed the LVAD parameters from today, and compared the results to the patient's prior recorded data.  No programming changes were made.  The LVAD is functioning within specified parameters.  The patient performs LVAD self-test daily.  LVAD interrogation was negative for any significant power changes, alarms or PI events/speed drops.  LVAD equipment check completed and is in good working order.  Back-up equipment present.   LVAD education done on emergency procedures and precautions and reviewed exit site care.    MAP 86  Physical Exam: General: Well appearing this am. NAD.  HEENT: Normal. Neck: Supple, JVP 7-8 cm. Carotids OK.  Cardiac:  Mechanical heart sounds with LVAD hum present.  Lungs:  CTAB, normal effort.  Abdomen:  NT, ND, no HSM. No bruits or masses. +BS  LVAD exit  site: Looks better, no tunneling. Dressing dry and intact. No erythema or drainage. Stabilization device present and accurately applied. Driveline dressing changed daily per sterile technique. Extremities:  Warm and dry. No cyanosis, clubbing, rash, or edema.  Neuro:  Alert & oriented x 3. Cranial nerves grossly intact. Moves all 4 extremities w/o  difficulty. Affect pleasant      ASSESSMENT AND PLAN:   1. 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  LVAD parameters stable with 5-10 PI events/day.  NYHA class I-II.   He does not look volume overloaded. MAP controlled.  - He does not need Lasix.      - Continue spironolactone 25 mg daily.  - Continue Entresto 97/103 bid.   - Continue amlodipine 5 mg daily.  - Continue ASA 81 daily.  - Continue warfarin for INR 2-2.5.  No evidence for GI bleeding. Hgb today is stable.  - Should be transplant candidate eventually if he can stay off cigarettes, he is very close to quitting.  Will make transplant clinic referral when he is off totally.  2. Smoking: Still smoking "1/2" cigarette/day. Knows he needs to quit to be a transplant candidate and working on it.  3. RLE DVT: On warfarin for LVAD.  4. OSA: Continue CPAP.  5. Hyperlipidemia: Atorvastatin.  6. Type II diabetes: Metformin, per PCP.  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   Loralie Champagne 05/20/2019

## 2019-05-28 ENCOUNTER — Other Ambulatory Visit (HOSPITAL_COMMUNITY): Payer: Self-pay | Admitting: *Deleted

## 2019-05-28 DIAGNOSIS — Z95811 Presence of heart assist device: Secondary | ICD-10-CM

## 2019-05-31 ENCOUNTER — Telehealth: Payer: Self-pay | Admitting: Cardiology

## 2019-05-31 NOTE — Telephone Encounter (Signed)
I called pt to confirm appt with Dr Harrell Gave on 06-01-19.

## 2019-06-01 ENCOUNTER — Ambulatory Visit: Payer: Medicare HMO | Admitting: Cardiology

## 2019-06-01 NOTE — Telephone Encounter (Signed)
Per chart review, pt is an LVAD. Called pt to cancel appointment and inform that Dr. Harrell Gave recommends he continue to be followed be the [facility]. Pt states he has an appointment with the tomorrow 7/15. Pt encouraged to keep appointment.

## 2019-06-02 ENCOUNTER — Other Ambulatory Visit: Payer: Self-pay

## 2019-06-02 ENCOUNTER — Ambulatory Visit (HOSPITAL_COMMUNITY): Payer: Self-pay | Admitting: Pharmacist

## 2019-06-02 ENCOUNTER — Ambulatory Visit (HOSPITAL_COMMUNITY)
Admission: RE | Admit: 2019-06-02 | Discharge: 2019-06-02 | Disposition: A | Discharge status: Home / Self Care | Payer: No Typology Code available for payment source | Source: Ambulatory Visit | Attending: Cardiology | Admitting: Cardiology

## 2019-06-02 DIAGNOSIS — Z95811 Presence of heart assist device: Secondary | ICD-10-CM

## 2019-06-02 LAB — PROTIME-INR
INR: 2.2 — ABNORMAL HIGH (ref 0.8–1.2)
Prothrombin Time: 24 seconds — ABNORMAL HIGH (ref 11.4–15.2)

## 2019-06-02 NOTE — Progress Notes (Signed)
Patient presents for dressing change with wound check in [facility] today by himself due to Covid-19 restrictions. Reports no problems with VAD equipment or concerns with drive line. Denies any falls, dizziness, lightheadedness, or syncope.   Pt confirms he is taking Keflex 500 mg three times daily.   Exit Site Care: Drive line is being maintained every other day by [employer]. VAD coordinator performed dressing change today and reviewed w/Dr.Van Trigt.   Existing dressing removed using sterile technique. Pt has small amount of tan drainage on the silver strip. Site cleansed with betadine swab x 2, silver strip re-applied, and covered with gauze dressing, anchor secure. No redness, tenderness, or rash noted. Pt provided with 10 daily dressing kits, Aquacel silver, sterile scissors, sterile cup, and a box of adhesive remover.  Instructed to continue advance to every 3 day dressing changes. If drainage increases, instructed pt to return to every other day or daily dressing changes to keep dressing dry. Also instructed to call VAD pager if any signs of infection occur. Pt verbalized understanding of same.  1. Return to [facility] in two weeks for dressing change. 2. PharmD will call you with warfarin dosing.   Zada Girt RN [place] Coordinator  Office: [phone]  24/7 Pager: [phone]

## 2019-06-02 NOTE — Addendum Note (Signed)
Encounter addended by: Lezlie Octave, RN on: 06/02/2019 10:29 AM  Actions taken: Clinical Note Signed

## 2019-06-11 ENCOUNTER — Other Ambulatory Visit (HOSPITAL_COMMUNITY): Payer: Self-pay | Admitting: *Deleted

## 2019-06-11 DIAGNOSIS — Z7901 Long term (current) use of anticoagulants: Secondary | ICD-10-CM

## 2019-06-11 DIAGNOSIS — Z95811 Presence of heart assist device: Secondary | ICD-10-CM

## 2019-06-15 ENCOUNTER — Other Ambulatory Visit (HOSPITAL_COMMUNITY): Payer: Self-pay | Admitting: Adult Health

## 2019-06-16 ENCOUNTER — Ambulatory Visit (HOSPITAL_COMMUNITY): Payer: Self-pay | Admitting: Pharmacist

## 2019-06-16 ENCOUNTER — Ambulatory Visit (HOSPITAL_COMMUNITY)
Admission: RE | Admit: 2019-06-16 | Discharge: 2019-06-16 | Disposition: A | Discharge status: Home / Self Care | Payer: No Typology Code available for payment source | Source: Ambulatory Visit | Attending: Cardiology | Admitting: Cardiology

## 2019-06-16 ENCOUNTER — Other Ambulatory Visit: Payer: Self-pay

## 2019-06-16 DIAGNOSIS — G4709 Other insomnia: Secondary | ICD-10-CM | POA: Diagnosis not present

## 2019-06-16 DIAGNOSIS — Z7901 Long term (current) use of anticoagulants: Secondary | ICD-10-CM | POA: Diagnosis not present

## 2019-06-16 DIAGNOSIS — Z95811 Presence of heart assist device: Secondary | ICD-10-CM

## 2019-06-16 LAB — PROTIME-INR
INR: 2.2 — ABNORMAL HIGH (ref 0.8–1.2)
Prothrombin Time: 24.1 seconds — ABNORMAL HIGH (ref 11.4–15.2)

## 2019-06-16 MED ORDER — ZOLPIDEM TARTRATE 5 MG PO TABS: 5.0000 mg | ORAL_TABLET | Freq: Every day | ORAL | 3 refills | Status: DC

## 2019-06-16 NOTE — Progress Notes (Addendum)
Patient presents for dressing change with wound check in [facility] today by himself due to Covid-19 restrictions. Reports no problems with VAD equipment or concerns with drive line. Denies any falls, dizziness, lightheadedness, or syncope.   Vital Signs:  Temp: 98.4 Automatc BP: 95/75 (83) HR: 72 SPO2: UTO   LVAD assessment VAD Speed: 5700 rpms Flow: 4.8 Power: 4.4 w    PI: 3.3 Alarms: none Events: 70 PI events on 06/15/19  Fixed speed: 5700 Low speed limit: 5400  Exit Site Care: Drive line is being maintained every third day by [facility]; last dressing change was performed Monday.Dr. Darcey Nora performed dressing change today. Existing dressing removed using sterile technique. Pt has small amount of old bloody drainage on the silver strips. Site cleansed with betadine swab x 2.  Silver strip wrapped around driveline w/ dry gauze and occlusive tape; anchor secure. No tunneling noted. Pt has adequate dressing supplies at home. Instructed to advance to twice weekly dressing changes.  Return to [facility] in 2 weeks for dressing change and INR.   Zada Girt RN [place] Coordinator  Office: [phone]  24/7 Pager: [phone]

## 2019-06-16 NOTE — Addendum Note (Signed)
Encounter addended by: Lezlie Octave, RN on: 06/16/2019 9:37 AM  Actions taken: Clinical Note Signed

## 2019-06-16 NOTE — Addendum Note (Signed)
Encounter addended by: Lezlie Octave, RN on: 06/16/2019 9:28 AM  Actions taken: Visit diagnoses modified, Order list changed, Diagnosis association updated, Clinical Note Signed

## 2019-06-25 ENCOUNTER — Other Ambulatory Visit (HOSPITAL_COMMUNITY): Payer: Self-pay | Admitting: *Deleted

## 2019-06-25 DIAGNOSIS — Z95811 Presence of heart assist device: Secondary | ICD-10-CM

## 2019-06-25 DIAGNOSIS — Z7901 Long term (current) use of anticoagulants: Secondary | ICD-10-CM

## 2019-06-30 ENCOUNTER — Other Ambulatory Visit (HOSPITAL_COMMUNITY): Payer: Self-pay | Admitting: *Deleted

## 2019-06-30 ENCOUNTER — Ambulatory Visit (HOSPITAL_COMMUNITY)
Admission: RE | Admit: 2019-06-30 | Discharge: 2019-06-30 | Disposition: A | Discharge status: Home / Self Care | Payer: No Typology Code available for payment source | Source: Ambulatory Visit | Attending: Internal Medicine | Admitting: Internal Medicine

## 2019-06-30 ENCOUNTER — Other Ambulatory Visit: Payer: Self-pay

## 2019-06-30 ENCOUNTER — Ambulatory Visit (HOSPITAL_COMMUNITY): Payer: Self-pay | Admitting: Pharmacist

## 2019-06-30 DIAGNOSIS — Z4801 Encounter for change or removal of surgical wound dressing: Secondary | ICD-10-CM | POA: Insufficient documentation

## 2019-06-30 DIAGNOSIS — Z95811 Presence of heart assist device: Secondary | ICD-10-CM | POA: Diagnosis not present

## 2019-06-30 DIAGNOSIS — Z7901 Long term (current) use of anticoagulants: Secondary | ICD-10-CM | POA: Diagnosis not present

## 2019-06-30 LAB — PROTIME-INR
INR: 1.6 — ABNORMAL HIGH (ref 0.8–1.2)
Prothrombin Time: 18.9 seconds — ABNORMAL HIGH (ref 11.4–15.2)

## 2019-06-30 LAB — LACTATE DEHYDROGENASE: LDH: 206 U/L — ABNORMAL HIGH (ref 98–192)

## 2019-06-30 NOTE — Addendum Note (Signed)
Encounter addended by: Mertha Baars, RN on: 06/30/2019 9:51 AM  Actions taken: Clinical Note Signed

## 2019-06-30 NOTE — Progress Notes (Signed)
Patient presents for dressing change with wound check in [facility] today by himself due to Covid-19 restrictions. Reports no problems with VAD equipment or concerns with drive line. Denies any falls, dizziness, lightheadedness, or syncope.    Exit Site Care: Drive line is being maintained every third day by [name]; last dressing change was performed Sunday.Dr. Darcey Nora performed dressing change today. Existing dressing removed using sterile technique. Pt has small amount of sticky green-yellow drainage on the silver strips. No odor noted. Site cleansed with betadine swab x 2.  Silver strip wrapped around driveline w/ dry gauze and occlusive tape; anchor secure. No tunneling noted. Pt has adequate dressing supplies at home. Continue Keflex 500mg  TID. Continue twice weekly dressing changes.        Return to [facility] in 2 weeks for dressing change and INR.   Emerson Monte RN [place] Coordinator  Office: [phone]  24/7 Pager: [phone]

## 2019-07-09 ENCOUNTER — Other Ambulatory Visit (HOSPITAL_COMMUNITY): Payer: Self-pay | Admitting: *Deleted

## 2019-07-09 DIAGNOSIS — Z7901 Long term (current) use of anticoagulants: Secondary | ICD-10-CM

## 2019-07-09 DIAGNOSIS — Z95811 Presence of heart assist device: Secondary | ICD-10-CM

## 2019-07-14 ENCOUNTER — Encounter (HOSPITAL_COMMUNITY): Payer: Self-pay

## 2019-07-14 ENCOUNTER — Ambulatory Visit (HOSPITAL_COMMUNITY): Payer: Self-pay | Admitting: Pharmacist

## 2019-07-14 ENCOUNTER — Ambulatory Visit (HOSPITAL_COMMUNITY)
Admission: RE | Admit: 2019-07-14 | Discharge: 2019-07-14 | Disposition: A | Discharge status: Home / Self Care | Payer: No Typology Code available for payment source | Source: Ambulatory Visit | Attending: Cardiology | Admitting: Cardiology

## 2019-07-14 ENCOUNTER — Other Ambulatory Visit: Payer: Self-pay

## 2019-07-14 VITALS — BP 120/72 | HR 83 | Temp 98.2°F | Wt 191.4 lb

## 2019-07-14 DIAGNOSIS — F17210 Nicotine dependence, cigarettes, uncomplicated: Secondary | ICD-10-CM | POA: Diagnosis not present

## 2019-07-14 DIAGNOSIS — E119 Type 2 diabetes mellitus without complications: Secondary | ICD-10-CM | POA: Insufficient documentation

## 2019-07-14 DIAGNOSIS — A4901 Methicillin susceptible Staphylococcus aureus infection, unspecified site: Secondary | ICD-10-CM | POA: Insufficient documentation

## 2019-07-14 DIAGNOSIS — Z7982 Long term (current) use of aspirin: Secondary | ICD-10-CM | POA: Insufficient documentation

## 2019-07-14 DIAGNOSIS — Z79899 Other long term (current) drug therapy: Secondary | ICD-10-CM | POA: Diagnosis not present

## 2019-07-14 DIAGNOSIS — G4733 Obstructive sleep apnea (adult) (pediatric): Secondary | ICD-10-CM | POA: Insufficient documentation

## 2019-07-14 DIAGNOSIS — Z95811 Presence of heart assist device: Secondary | ICD-10-CM

## 2019-07-14 DIAGNOSIS — K219 Gastro-esophageal reflux disease without esophagitis: Secondary | ICD-10-CM | POA: Insufficient documentation

## 2019-07-14 DIAGNOSIS — Z4800 Encounter for change or removal of nonsurgical wound dressing: Secondary | ICD-10-CM | POA: Diagnosis not present

## 2019-07-14 DIAGNOSIS — I82401 Acute embolism and thrombosis of unspecified deep veins of right lower extremity: Secondary | ICD-10-CM | POA: Diagnosis not present

## 2019-07-14 DIAGNOSIS — Z7901 Long term (current) use of anticoagulants: Secondary | ICD-10-CM

## 2019-07-14 DIAGNOSIS — I428 Other cardiomyopathies: Secondary | ICD-10-CM | POA: Diagnosis not present

## 2019-07-14 DIAGNOSIS — I5022 Chronic systolic (congestive) heart failure: Secondary | ICD-10-CM

## 2019-07-14 DIAGNOSIS — E785 Hyperlipidemia, unspecified: Secondary | ICD-10-CM | POA: Insufficient documentation

## 2019-07-14 DIAGNOSIS — E7800 Pure hypercholesterolemia, unspecified: Secondary | ICD-10-CM | POA: Diagnosis not present

## 2019-07-14 LAB — COMPREHENSIVE METABOLIC PANEL
ALT: 17 U/L (ref 0–44)
AST: 23 U/L (ref 15–41)
Albumin: 3.5 g/dL (ref 3.5–5.0)
Alkaline Phosphatase: 99 U/L (ref 38–126)
Anion gap: 12 (ref 5–15)
BUN: 11 mg/dL (ref 6–20)
CO2: 19 mmol/L — ABNORMAL LOW (ref 22–32)
Calcium: 8.7 mg/dL — ABNORMAL LOW (ref 8.9–10.3)
Chloride: 107 mmol/L (ref 98–111)
Creatinine, Ser: 1 mg/dL (ref 0.61–1.24)
GFR calc Af Amer: >60 mL/min (ref >60)
GFR calc non Af Amer: >60 mL/min (ref >60)
Glucose, Bld: 120 mg/dL — ABNORMAL HIGH (ref 70–99)
Potassium: 3.7 mmol/L (ref 3.5–5.1)
Sodium: 138 mmol/L (ref 135–145)
Total Bilirubin: 0.6 mg/dL (ref 0.3–1.2)
Total Protein: 6.9 g/dL (ref 6.5–8.1)

## 2019-07-14 LAB — CBC
HCT: 38.5 % — ABNORMAL LOW (ref 39.0–52.0)
Hemoglobin: 11.5 g/dL — ABNORMAL LOW (ref 13.0–17.0)
MCH: 24.2 pg — ABNORMAL LOW (ref 26.0–34.0)
MCHC: 29.9 g/dL — ABNORMAL LOW (ref 30.0–36.0)
MCV: 81.1 fL (ref 80.0–100.0)
Platelets: 181 10*3/uL (ref 150–400)
RBC: 4.75 MIL/uL (ref 4.22–5.81)
RDW: 18.4 % — ABNORMAL HIGH (ref 11.5–15.5)
WBC: 7.2 10*3/uL (ref 4.0–10.5)
nRBC: 0 % (ref 0.0–0.2)

## 2019-07-14 LAB — LACTATE DEHYDROGENASE: LDH: 186 U/L (ref 98–192)

## 2019-07-14 LAB — PROTIME-INR
INR: 2.5 — ABNORMAL HIGH (ref 0.8–1.2)
Prothrombin Time: 26.9 seconds — ABNORMAL HIGH (ref 11.4–15.2)

## 2019-07-14 LAB — PREALBUMIN: Prealbumin: 25.2 mg/dL (ref 18–38)

## 2019-07-14 MED ORDER — ATORVASTATIN CALCIUM 20 MG PO TABS: 20.0000 mg | ORAL_TABLET | Freq: Every day | ORAL | 3 refills | Status: DC

## 2019-07-14 NOTE — Addendum Note (Signed)
Encounter addended by: Larey Dresser, MD on: 07/14/2019 7:16 PM  Actions taken: Clinical Note Signed, Charge Capture section accepted, Level of Service modified, Visit diagnoses modified

## 2019-07-14 NOTE — Patient Instructions (Addendum)
1. No medication changes today  2. Continue every 3 day dressing changes 2. Audry Riles PharmD will be in touch with your Coumadin dosing 3. Please bring all your equipment to your next visit  4. Return to clinic for dressing change and INR in 2 weeks 5. Return to clinic in 2 months for full VAD follow up

## 2019-07-14 NOTE — Addendum Note (Signed)
Encounter addended by: Mertha Baars, RN on: 07/14/2019 12:19 PM  Actions taken: Clinical Note Signed

## 2019-07-14 NOTE — Progress Notes (Addendum)
Patient presents for 2 mo f/u and wound check per Dr. Donata ClayVan Trigt in [facility] today by himself due to Covid-19 restrictions. Reports no problems with VAD equipment or concerns with drive line. Denies any falls, dizziness, lightheadedness, or syncope.   Pt says he has been feeling fine, has no limitations with his daily activities.   Reports that he is smoking 1/2 a pack of cigarettes a day. Encouraged complete cessation.   Vital Signs:  Temp:  98.2 Doppler Pressure:  100 Automatc BP:  120/72 (91) HR:  83 SPO2: UTO  Weight: 191.4 lb w/o eqt Last weight: 191.2 lb  VAD Indication: Destination Therapy due to smoking status  VAD interrogation & Equipment Management: Speed: 5700 Flow:  4.5 Power:  4.4w    PI: 4.7 Hct: 38  Alarms: no clinical alarms Events:  20-60 PI events daily Fixed speed: 5700 Low speed limit: 5400  Primary Controller:  Replace back up battery in 20 months. Back up controller:   Replace back up battery in 23 months.  Annual Equipment Maintenance on UBC/PM was performed on 07/2018.   I reviewed the LVAD parameters from today and compared the results to the patient's prior recorded data. LVAD interrogation was NEGATIVE for significant power changes, NEGATIVE for clinical alarms and STABLE for PI events/speed drops. No programming changes were made and pump is functioning within specified parameters. Pt is performing daily controller and system monitor self tests along with completing weekly and monthly maintenance for LVAD equipment.  LVAD equipment check completed and is in good working order. Back-up equipment present.   Exit Site Care: Drive line is being maintained daily by [employer]. Gauze dressing with anchor intact and accurately applied. Dr. Maren BeachVanTrigt performed dressing change today. Existing dressing removed using sterile technique. Exit site cleansed with betadine swab x 2 and allowed to dry. Site cleansed with sterile swab and allowed to dry. Silver  strip applied to site and topped with sterile gauze dressing. Pt has small amount of yellow/green drainage on the silver strips with no foul odor, rash, or tenderness noted. Exit site with complete tissue ingrowth, no tunneling noted. Silver nitrate swab to hypergranulated tissue around exit site. Pt provided with 14 daily dressing kits, 28 anchors (14 of each type of anchor due to patient having to alternate type due to skin breakouts,) a box of cotton tip applicators, and a box of alcohol pads. Instructed to continue daily dressing changes.    Addendum: Per Dr Donata ClayVan Trigt will trial not using silver strip for next 2 weeks to assess if patient has sensitivity to silver strip that is causing drainage. Patient aware and agrees to plan.       BP &Labs:  Doppler BP 100- Doppler is reflecting modified systolic  Hgb 11.5 - No S/S of bleeding. Specifically denies melena/BRBPR or nosebleeds.  LDH 186 with established baseline of 200- 250. Denies tea-colored urine. No power elevations noted on interrogation.   1 year Intermacs follow up completed including:  Quality of Life, KCCQ-12, and Neurocognitive trail making.   Pt completed 1300 feet during 6 minute walk.   Patient Instructions: 1. No medication changes today  2. Continue every 3 day dressing changes 2. Karle PlumberLauren Kemp PharmD will be in touch with your Coumadin dosing 3. Please bring all your equipment to your next visit  4. Return to clinic for dressing change and INR in 2 weeks 5. Return to clinic in 2 months for full VAD follow up  Alyce PaganAllison Haggard RN VAD Coordinator  Office: [phone]  24/7 Pager: [phone]   58 y.o. with history of nonischemic cardiomyopathy, RLE DVT, cirrhosis, smoking, and OSA returns for followup of CHF/LVAD placement.  Cardiomyopathy was diagnosed in 6/19 in [place], [place] at that time showed low output.  He was admitted to [facility] in 9/19 with low output HF and was started on milrinone and diuresed.   Unable to wean off milrinone.  He had a degree of RV failure, but this improved on milrinone.  Valvular heart disease also looked better with milrinone and diuresis.  On 08/03/18, he had Heartmate 3 LVAD placed.  Speed was optimized by ramp echo post-op.  Post-op course was relatively unremarkable.  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  No abscess on CT.  He was started on cefazolin IV for 6 wks.  BP-active meds decreased with low MAP.   He is now on chronic Keflex for MSSA suppression.  Weight is stable.  MAP 91.  Multiple PI events but this is stable for him. Feeling good, no significant dyspnea. Has great-grandchildren staying with him because of the impending [place] hurricane.  He has been playing with them a lot.  No lightheadedness.  Still smoking occasionally.    Labs (9/19): LDH 210, INR 2.17, WBCs 17.1 => 15, hgb 9.7, creatinine 0.72 Labs (10/19): K 4.3, creatinine 0.78 => 0.83, hgb 10.2 Labs (11/19): creatinine 0.79, hgb 10 Labs (2/20): K 3.9, creatinine 0.79 Labs (4/20): K 4, creatinine 1.15, hgb 11.4, LDH 199, INR 1.9 Labs (5/20): hgb 10.4, WBCs 8.5 Labs (6/20): K 4.2, creatinine 1.06, hgb 11.2 Labs (8/20): k 3.7, creatinine 1.0, hgb 11.5, LDH 186  Denies LVAD alarms.  Denies driveline trauma, erythema or drainage.  Denies ICD shocks.   Reports taking Coumadin as prescribed and adherence to anticoagulation based dietary restrictions.  Denies bright red blood per rectum or melena, no dark urine or hematuria.    PMH: 1. Degenerative disc disease.  2. GERD 3. Chronic systolic CHF:  Nonischemic cardiomyopathy.  Dilated cardiomyopathy diagnosed 6/19 in [place].  LHC/RHC in 7/19 showed elevated filling pressures, low cardiac output, and no significant CAD.   - Echo (9/19): Severe LV dilation with EF 10-20%, moderate-severe MR, severely dilated RV with mildly decreased systolic function, severe  TR.  - Cardiac MRI (9/19): EF 14%, moderate dilated LV, severely dilated RV with mod-severe systolic function and EF 25%, nonspecific RV insertion site LGE.  - RHC (5/19) on milrinone 0.375: mean RA 8, PA 40/19, mean PCWP 12, PAPi 2.65, CI 2.71.  - Echo (9/19) on milrinone and diuresed): EF 20-25% with moderate LV dilation, moderately dilated RV with mildly decreased systolic function, mild-moderate MR, moderate TR, PASP 51 mmHg.  Cannot rule out noncompaction.  - Heartmate 3 LVAD placement in 9/19.  4. OSA: CPAP use.  5. Prior smoker 6. Type 2 diabetes 7. Hyperlipidemia.  8. H/o NSVT 9. 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  10. RLE DVT: found in 9/19.  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   Current Outpatient Medications  Medication Sig Dispense Refill  . aspirin EC 81 MG tablet Take 81 mg by mouth daily.    Marland Kitchen atorvastatin (LIPITOR) 20 MG tablet Take 1 tablet (20 mg total) by mouth daily. 90 tablet 3  . carbamide peroxide (DEBROX) 6.5 % OTIC solution Place 5 drops into both ears 3 (three) times daily. 15 mL  0  . cephALEXin (KEFLEX) 500 MG capsule Take 1 capsule (500 mg total) by mouth 3 (three) times daily. 90 capsule 2  . cyanocobalamin 500 MCG tablet Take 1,000 mcg by mouth daily.     . DULoxetine (CYMBALTA) 60 MG capsule Take 60 mg by mouth daily.    . fluticasone (FLONASE) 50 MCG/ACT nasal spray Place 1 spray into both nostrils 2 (two) times daily as needed for allergies.     Marland Kitchen gabapentin (NEURONTIN) 300 MG capsule Take 600 mg by mouth 3 (three) times daily.    Marland Kitchen glucose blood (CONTOUR NEXT TEST) test strip Use as instructed 100 each 12  . hydrOXYzine (VISTARIL) 25 MG capsule Take 25 mg by mouth 3 (three) times daily as needed for anxiety.    . Ipratropium-Albuterol (COMBIVENT) 20-100 MCG/ACT AERS respimat Inhale 1 puff into the lungs every 6 (six) hours.    . Lancets (FREESTYLE) lancets Use as instructed 100 each 12  .  metFORMIN (GLUCOPHAGE-XR) 500 MG 24 hr tablet Take 500 mg by mouth 2 (two) times daily.    . methocarbamol (ROBAXIN) 750 MG tablet Take 750 mg by mouth 3 (three) times daily as needed for muscle spasms.     Marland Kitchen omeprazole (PRILOSEC) 20 MG capsule Take 20 mg by mouth daily.    . sacubitril-valsartan (ENTRESTO) 49-51 MG Take 1 tablet by mouth 2 (two) times daily. 60 tablet 11  . sildenafil (VIAGRA) 100 MG tablet Take 50 mg by mouth daily as needed for erectile dysfunction.    Marland Kitchen spironolactone (ALDACTONE) 25 MG tablet Take 1 tablet (25 mg total) by mouth daily. 30 tablet 6  . thiamine 100 MG tablet Take 1 tablet (100 mg total) by mouth daily. 30 tablet 0  . traMADol (ULTRAM) 50 MG tablet Take 1 tablet (50 mg total) by mouth every 4 (four) hours as needed for moderate pain. 30 tablet 0  . warfarin (COUMADIN) 5 MG tablet Take 5-7.5 mg by mouth See admin instructions. Take  1 and 1/2 tablets (7.5 mg) Tue, Thur and Sat Take 1 tablet by mouth all other days Or as directed by [facility] clinic    . Zinc Gluconate 100 MG TABS Take 1 tablet (100 mg total) by mouth daily. (Patient taking differently: Take 100 mg by mouth 2 (two) times a day. ) 90 tablet 3  . zolpidem (AMBIEN) 5 MG tablet Take 1 tablet (5 mg total) by mouth at bedtime. 30 tablet 3  . amLODipine (NORVASC) 5 MG tablet Take 1 tablet (5 mg total) by mouth daily. 180 tablet 3  . docusate sodium (COLACE) 100 MG capsule Take 2 capsules (200 mg total) by mouth daily as needed for mild constipation. (Patient not taking: Reported on 07/14/2019) 10 capsule 0  . nicotine (NICODERM CQ - DOSED IN MG/24 HR) 7 mg/24hr patch Place 1 patch (7 mg total) onto the skin daily as needed (tobacco craving). (Patient not taking: Reported on 05/19/2019) 28 patch 0  . nystatin (MYCOSTATIN/NYSTOP) powder Use with VAD dressing changes daily, advance as directed. (Patient not taking: Reported on 07/14/2019) 15 g 3  . polyethylene glycol (MIRALAX / GLYCOLAX) packet Take 17 g by mouth daily  as needed for mild constipation.     No current facility-administered medications for this encounter.     Patient has no known allergies.  REVIEW OF SYSTEMS: All systems negative except as listed in HPI, PMH and Problem list.   LVAD INTERROGATION:  Please see LVAD nurse's note above for details.  I reviewed the LVAD parameters from today, and compared the results to the patient's prior recorded data.  No programming changes were made.  The LVAD is functioning within specified parameters.  The patient performs LVAD self-test daily.  LVAD interrogation was negative for any significant power changes, alarms or PI events/speed drops.  LVAD equipment check completed and is in good working order.  Back-up equipment present.   LVAD education done on emergency procedures and precautions and reviewed exit site care.    MAP 91  Physical Exam: General: Well appearing this am. NAD.  HEENT: Normal. Neck: Supple, JVP 7-8 cm. Carotids OK.  Cardiac:  Mechanical heart sounds with LVAD hum present.  Lungs:  CTAB, normal effort.  Abdomen:  NT, ND, no HSM. No bruits or masses. +BS  LVAD exit site: Well-healed and incorporated. Dressing dry and intact. No erythema or drainage. Stabilization device present and accurately applied. Driveline dressing changed daily per sterile technique. Extremities:  Warm and dry. No cyanosis, clubbing, rash, or edema.  Neuro:  Alert & oriented x 3. Cranial nerves grossly intact. Moves all 4 extremities w/o difficulty. Affect pleasant      ASSESSMENT AND PLAN:   1. 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  LVAD parameters stable with 10+ PI events/day.  NYHA class I-II.   He does not look volume overloaded. MAP controlled.  - He does not need Lasix.      - Continue spironolactone 25 mg daily.  - Continue Entresto 49/51 bid.   - Continue amlodipine 5 mg daily.  - Continue ASA 81 daily.  - Continue warfarin for INR 2-2.5.  No  evidence for GI bleeding. Hgb today is stable.  - Should be transplant candidate eventually if he can stay off cigarettes, he is very close to quitting.  Will make transplant clinic referral when he is off totally.  2. Smoking: Still smoking a few cigarettes/day. Knows he needs to quit to be a transplant candidate and working on it.  3. RLE DVT: On warfarin for LVAD.  4. OSA: Continue CPAP.  5. Hyperlipidemia: Atorvastatin.  6. Type II diabetes: Metformin, per PCP.  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   Marca AnconaDalton Maysen Sudol 07/14/2019

## 2019-07-14 NOTE — Addendum Note (Signed)
Encounter addended by: Mertha Baars, RN on: 07/14/2019 12:00 PM  Actions taken: Clinical Note Signed, Pend clinical note

## 2019-07-14 NOTE — Addendum Note (Signed)
Encounter addended by: Mertha Baars, RN on: 07/14/2019 9:06 AM  Actions taken: Vitals modified, Home Medications modified, Medication List reviewed, Medication taking status modified, Order Reconciliation Section accessed

## 2019-07-14 NOTE — Addendum Note (Signed)
Encounter addended by: Mertha Baars, RN on: 07/14/2019 9:45 AM  Actions taken: Order Reconciliation Section accessed, Visit diagnoses modified, Order list changed, Diagnosis association updated, Clinical Note Signed

## 2019-07-23 ENCOUNTER — Other Ambulatory Visit (HOSPITAL_COMMUNITY): Payer: Self-pay | Admitting: *Deleted

## 2019-07-23 DIAGNOSIS — Z7901 Long term (current) use of anticoagulants: Secondary | ICD-10-CM

## 2019-07-23 DIAGNOSIS — Z95811 Presence of heart assist device: Secondary | ICD-10-CM

## 2019-07-23 DIAGNOSIS — E7800 Pure hypercholesterolemia, unspecified: Secondary | ICD-10-CM

## 2019-07-27 ENCOUNTER — Other Ambulatory Visit (HOSPITAL_COMMUNITY): Payer: Self-pay | Admitting: *Deleted

## 2019-07-27 DIAGNOSIS — Z95811 Presence of heart assist device: Secondary | ICD-10-CM

## 2019-07-27 DIAGNOSIS — Z7901 Long term (current) use of anticoagulants: Secondary | ICD-10-CM

## 2019-07-28 ENCOUNTER — Ambulatory Visit (HOSPITAL_COMMUNITY): Payer: Self-pay | Admitting: Pharmacist

## 2019-07-28 ENCOUNTER — Ambulatory Visit (HOSPITAL_COMMUNITY)
Admission: RE | Admit: 2019-07-28 | Discharge: 2019-07-28 | Disposition: A | Discharge status: Home / Self Care | Payer: No Typology Code available for payment source | Source: Ambulatory Visit | Attending: Cardiology | Admitting: Cardiology

## 2019-07-28 ENCOUNTER — Other Ambulatory Visit: Payer: Self-pay

## 2019-07-28 ENCOUNTER — Telehealth (HOSPITAL_COMMUNITY): Payer: Self-pay | Admitting: Pharmacist

## 2019-07-28 DIAGNOSIS — Z95811 Presence of heart assist device: Secondary | ICD-10-CM

## 2019-07-28 DIAGNOSIS — Z7901 Long term (current) use of anticoagulants: Secondary | ICD-10-CM

## 2019-07-28 LAB — PROTIME-INR
INR: 1.4 — ABNORMAL HIGH (ref 0.8–1.2)
Prothrombin Time: 16.8 seconds — ABNORMAL HIGH (ref 11.4–15.2)

## 2019-07-28 LAB — IRON AND TIBC
Iron: 30 ug/dL — ABNORMAL LOW (ref 45–182)
Saturation Ratios: 5 % — ABNORMAL LOW (ref 17.9–39.5)
TIBC: 550 ug/dL — ABNORMAL HIGH (ref 250–450)
UIBC: 520 ug/dL

## 2019-07-28 LAB — FOLATE: Folate: 20.4 ng/mL (ref >5.9)

## 2019-07-28 LAB — VITAMIN B12: Vitamin B-12: 756 pg/mL (ref 180–914)

## 2019-07-28 LAB — FERRITIN: Ferritin: 21 ng/mL — ABNORMAL LOW (ref 24–336)

## 2019-07-28 MED ORDER — ENOXAPARIN SODIUM 40 MG/0.4ML ~~LOC~~ SOLN: 40.0000 mg | Freq: Two times a day (BID) | SUBCUTANEOUS | 1 refills | Status: DC

## 2019-07-28 NOTE — Addendum Note (Signed)
Encounter addended by: Mertha Baars, RN on: 07/28/2019 1:12 PM  Actions taken: Clinical Note Signed

## 2019-07-28 NOTE — Addendum Note (Signed)
Encounter addended by: Mertha Baars, RN on: 07/28/2019 1:23 PM  Actions taken: Pend clinical note

## 2019-07-28 NOTE — Addendum Note (Signed)
Addended by: Cammie Mcgee on: 07/28/2019 02:33 PM   Modules accepted: Orders

## 2019-07-28 NOTE — Telephone Encounter (Signed)
INR < goal  Instructed to use enoxaparin 40mg q12h x 3 days  

## 2019-07-28 NOTE — Addendum Note (Signed)
Encounter addended by: Mertha Baars, RN on: 07/28/2019 1:28 PM  Actions taken: Clinical Note Signed

## 2019-07-28 NOTE — Progress Notes (Addendum)
Patient presents for dressing change with wound check in [facility] today by himself due to Covid-19 restrictions. Reports no problems with VAD equipment or concerns with drive line. Denies any falls, dizziness, lightheadedness, or syncope.    Exit Site Care: Drive line is being maintained every third day by [name]; last dressing change was performed Sunday.Dr. Darcey Nora performed dressing change today. Existing dressing removed using sterile technique. Pt has small amount of sticky green-yellow drainage on gauze. No odor noted. Site cleansed with betadine swab x 2.  No silver strip applied due to potential silver strip allergy. Topped w/ dry gauze and occlusive tape; anchor secure. No tunneling noted. Pt has adequate dressing supplies at home. Continue Keflex 500mg  TID. Continue twice weekly dressing changes.       Return to [facility] clinic in 2 weeks for dressing change and INR.   Batteries Manufacture Date: Number of uses: Re-calibration  04/2018 26-27 Performed by patient   Annual maintenance completed per [employer] on patient's home power module and Electrical engineer.    Backup system controller replaced this visit due to prong broken off in white power cable and unable to hook up to power source. Patient denies ever hooking back up controller to a power source to charge. All prongs intact on MPU.   SN: [id number]  Manufacture Date: 03/12/2019  Expiration date: 03/11/2022  Backup battery in backup controller:  SN: [id number]  Manufacture Date: 02/02/2019 Expiration date: 01/04/2021    Emerson Monte RN [place] Coordinator  Office: [phone]  24/7 Pager: [phone]

## 2019-07-28 NOTE — Progress Notes (Signed)
LVAD INR 

## 2019-07-29 ENCOUNTER — Other Ambulatory Visit (HOSPITAL_COMMUNITY): Payer: Self-pay | Admitting: Pharmacist

## 2019-07-29 ENCOUNTER — Telehealth (HOSPITAL_COMMUNITY): Payer: Self-pay | Admitting: *Deleted

## 2019-07-29 ENCOUNTER — Other Ambulatory Visit (HOSPITAL_COMMUNITY): Payer: Self-pay | Admitting: *Deleted

## 2019-07-29 DIAGNOSIS — Z95811 Presence of heart assist device: Secondary | ICD-10-CM

## 2019-07-29 DIAGNOSIS — E611 Iron deficiency: Secondary | ICD-10-CM

## 2019-07-29 MED ORDER — ENOXAPARIN SODIUM 40 MG/0.4ML ~~LOC~~ SOLN: 40.0000 mg | SUBCUTANEOUS | 1 refills | Status: DC

## 2019-07-29 NOTE — Telephone Encounter (Signed)
Spoke with patient regarding low iron lab results. Scheduled patient for 2 doses of IV Fereheme in short stay per Dr [name]. First dose appt 08/11/19 at 1000. Patient verbalized understanding.   Emerson Monte RN [place] Coordinator  Office: [phone]  24/7 Pager: [phone]

## 2019-07-30 ENCOUNTER — Other Ambulatory Visit (HOSPITAL_COMMUNITY): Payer: Self-pay | Admitting: Unknown Physician Specialty

## 2019-07-30 DIAGNOSIS — Z95811 Presence of heart assist device: Secondary | ICD-10-CM

## 2019-07-30 DIAGNOSIS — Z7901 Long term (current) use of anticoagulants: Secondary | ICD-10-CM

## 2019-08-02 ENCOUNTER — Ambulatory Visit (HOSPITAL_COMMUNITY)
Admission: RE | Admit: 2019-08-02 | Discharge: 2019-08-02 | Disposition: A | Discharge status: Home / Self Care | Payer: No Typology Code available for payment source | Source: Ambulatory Visit | Attending: Internal Medicine | Admitting: Internal Medicine

## 2019-08-02 ENCOUNTER — Ambulatory Visit (HOSPITAL_COMMUNITY): Payer: Self-pay | Admitting: Pharmacist

## 2019-08-02 ENCOUNTER — Other Ambulatory Visit: Payer: Self-pay

## 2019-08-02 DIAGNOSIS — Z95811 Presence of heart assist device: Secondary | ICD-10-CM | POA: Insufficient documentation

## 2019-08-02 DIAGNOSIS — Z7901 Long term (current) use of anticoagulants: Secondary | ICD-10-CM

## 2019-08-02 LAB — PROTIME-INR
INR: 2.2 — ABNORMAL HIGH (ref 0.8–1.2)
Prothrombin Time: 23.8 seconds — ABNORMAL HIGH (ref 11.4–15.2)

## 2019-08-02 NOTE — Progress Notes (Signed)
LVAD INR 

## 2019-08-06 ENCOUNTER — Other Ambulatory Visit (HOSPITAL_COMMUNITY): Payer: Self-pay | Admitting: Unknown Physician Specialty

## 2019-08-06 DIAGNOSIS — Z7901 Long term (current) use of anticoagulants: Secondary | ICD-10-CM

## 2019-08-06 DIAGNOSIS — Z95811 Presence of heart assist device: Secondary | ICD-10-CM

## 2019-08-11 ENCOUNTER — Ambulatory Visit (HOSPITAL_COMMUNITY)
Admission: RE | Admit: 2019-08-11 | Discharge: 2019-08-11 | Disposition: A | Discharge status: Home / Self Care | Payer: No Typology Code available for payment source | Source: Ambulatory Visit | Attending: Cardiology | Admitting: Cardiology

## 2019-08-11 ENCOUNTER — Ambulatory Visit (HOSPITAL_COMMUNITY)
Admission: RE | Admit: 2019-08-11 | Discharge: 2019-08-11 | Disposition: A | Discharge status: Home / Self Care | Payer: No Typology Code available for payment source | Source: Ambulatory Visit | Attending: Internal Medicine | Admitting: Internal Medicine

## 2019-08-11 ENCOUNTER — Ambulatory Visit (HOSPITAL_COMMUNITY): Payer: Self-pay | Admitting: Pharmacist

## 2019-08-11 ENCOUNTER — Other Ambulatory Visit: Payer: Self-pay

## 2019-08-11 DIAGNOSIS — Z95811 Presence of heart assist device: Secondary | ICD-10-CM

## 2019-08-11 DIAGNOSIS — E611 Iron deficiency: Secondary | ICD-10-CM

## 2019-08-11 DIAGNOSIS — Z4801 Encounter for change or removal of surgical wound dressing: Secondary | ICD-10-CM | POA: Diagnosis not present

## 2019-08-11 DIAGNOSIS — Z7901 Long term (current) use of anticoagulants: Secondary | ICD-10-CM | POA: Diagnosis not present

## 2019-08-11 LAB — PROTIME-INR
INR: 2.1 — ABNORMAL HIGH (ref 0.8–1.2)
Prothrombin Time: 23 seconds — ABNORMAL HIGH (ref 11.4–15.2)

## 2019-08-11 MED ORDER — SODIUM CHLORIDE 0.9 % IV SOLN
510.0000 mg | INTRAVENOUS | Status: DC
  Administered 2019-08-11: 510 mg via INTRAVENOUS
  Filled 2019-08-11: qty 17

## 2019-08-11 NOTE — Progress Notes (Signed)
Patient presents for dressing change with wound check in [facility] today with his wife [name]. Reports no problems with VAD equipment or concerns with drive line. Denies any falls, dizziness, lightheadedness, or syncope.   Exit Site Care: Drive line is being maintained every third day by [name]; last dressing change was performed yesterday. Dr. Darcey Nora performed dressing change today. Existing dressing removed using sterile technique. Pt has small amount of sticky green-yellow drainage on gauze. No odor noted. Site cleansed with betadine swab x 2.  No silver strip applied due to potential silver strip allergy. Topped w/ dry gauze and occlusive tape; anchor secure. No tunneling noted. Silver nitrate stick to hypergranulation tissue per Dr Prescott Gum. Pt provided with 10 daily kits and 6 anchors. Continue Keflex 500mg  TID. Increase dressing change frequency to daily until there is no drainage present, then may decrease frequency back to every other day/every 3 days.  Return to [facility] in 2 weeks for dressing change and INR.    Emerson Monte RN [place] Coordinator  Office: [phone]  24/7 Pager: [phone]

## 2019-08-11 NOTE — Discharge Instructions (Signed)

## 2019-08-11 NOTE — Progress Notes (Signed)
LVAD INR 

## 2019-08-12 ENCOUNTER — Encounter: Payer: Self-pay | Admitting: Infectious Diseases

## 2019-08-12 ENCOUNTER — Ambulatory Visit (INDEPENDENT_AMBULATORY_CARE_PROVIDER_SITE_OTHER): Payer: No Typology Code available for payment source | Admitting: Infectious Diseases

## 2019-08-12 DIAGNOSIS — T827XXA Infection and inflammatory reaction due to other cardiac and vascular devices, implants and grafts, initial encounter: Secondary | ICD-10-CM | POA: Diagnosis not present

## 2019-08-12 DIAGNOSIS — Z7185 Encounter for immunization safety counseling: Secondary | ICD-10-CM | POA: Insufficient documentation

## 2019-08-12 DIAGNOSIS — Z95811 Presence of heart assist device: Secondary | ICD-10-CM | POA: Diagnosis not present

## 2019-08-12 DIAGNOSIS — Z7189 Other specified counseling: Secondary | ICD-10-CM

## 2019-08-12 MED ORDER — CEPHALEXIN 500 MG PO CAPS: 500.0000 mg | ORAL_CAPSULE | Freq: Three times a day (TID) | ORAL | 11 refills | Status: DC

## 2019-08-12 NOTE — Patient Instructions (Signed)
Wonderful to see you and your wife today.   Please continue your antibiotic (Keflex) three times a day as you are currently taking.   We may be able to consider dropping you down to twice a day at your next visit. Will see how you continue to do.   Please schedule a follow up with Colletta [place] again in 9 months.

## 2019-08-12 NOTE — Assessment & Plan Note (Signed)
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  We discussed again how there was a high likelihood that his pump became infected and would presume this to be the case. He is doing well on his 3 times a day Keflex.  I may consider dropping him down to 500 twice a day for chronic suppression after at least a year of treatment dosing.  Will review and discuss further with pharmacy team.    I reviewed all previous clinic encounters with the vent team since his prior visit with me and notice waxing and waning drainage and hyper granulation tissue.  Of course with his wound on the abdomen not being fully healed he is at risk for acquiring other bacterial infections.  Would continue to monitor drainage and signs of pain to determine need for further cultures.  His white blood cell counts are normal and he has had no symptoms indicating relapsing bacteremia today.  We will have him come back in 9 months.  Provided him with refills of Keflex

## 2019-08-12 NOTE — Assessment & Plan Note (Signed)
I offered him a flu shot today.  He was just given intravenous iron yesterday would like a break from needles.  I will make him an appointment in 3 weeks for a injection with our clinic team.  We also discussed given his age he would be a good candidate for shingles vaccine.  I asked him to call his local [facility] or [facility] to see if they have the dose.  Many pharmacies now have standard orders to administer.  He and his wife will both consider this.

## 2019-08-12 NOTE — Progress Notes (Signed)
Patient: Theodore Welch  DOB: 10/05/1961 MRN: [id number] PCP: Cleta AlbertsMulles, Corazon, MD    Patient Active Problem List   Diagnosis Date Noted  . Infection associated with driveline of left ventricular assist device (LVAD) (HCC) 11/28/2018    Priority: High  . Complication involving left ventricular assist device (LVAD) 11/23/2018    Priority: High  . LVAD (left ventricular assist device) present (HCC) 08/03/2018    Priority: High  . Vaccine counseling 08/12/2019  . Chronic systolic heart failure (HCC) 11/28/2018  . OSA (obstructive sleep apnea) 09/02/2018  . DVT (deep venous thrombosis) (HCC) 08/02/2018  . Goals of care, counseling/discussion   . Advance care planning   . Palliative care encounter   . Malnutrition of moderate degree 07/25/2018  . Ascites   . COPD (chronic obstructive pulmonary disease) (HCC) 07/21/2018  . HTN (hypertension) 07/21/2018  . Tobacco use 07/21/2018  . Congestive heart failure (HCC) 07/21/2018  . CHF (congestive heart failure) (HCC) 07/21/2018  . Type 2 diabetes mellitus (HCC) 07/21/2018  . Hyperlipidemia 07/21/2018  . Fibromyalgia syndrome 01/27/2017  . Myofascial pain syndrome, cervical 01/27/2017  . Lumbar radiculopathy 01/24/2014  . History of lumbar laminectomy for spinal cord decompression 12/07/2013  . 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     Subjective:  CC: Hospital follow up MSSA bacteremia and LVAD patient; chronic driveline infection.  No concerns today.  His wife joins him for a visit.   Brief ID Hx: Theodore Welch is a 58 y.o. male with LVAD for end stage heart failure with history of MSSA bacteremia in the setting of infection of his driveline exitsite. He has required surgical debridement for deep cutaneous abscess tracking along power cord in the past and infection involving the counter incision after initial implant. Admitted May 2020 with recurrent MSSA infection and secondary bacteremia.  Did not perform TEE with plans for chronic suppression; he was not considered to be surgical candidate for device exchange with hopes to have him move forward with heart transplant candidacy.    HPI: Theodore Welch is doing very well on his Keflex 3 times a day.  He is not missed any doses of his antibiotic and has had no concerns for side effects.  He tells me he is gone back and forth [facility] every 2 weeks for a wound check of his driveline infection.  It waxes and wanes with drainage.  Recently needed chemical cautery with silver nitrate to treat hyper granulation tissue and has had some yellow/purulent drainage.  Increase dressing frequency to daily during these moments with twice a week thereafter.  He is not using chlorhexidine or silver due to contact dermatitis from these agents.  He is using Betadine to clean his site as directed by the bed clinic.  He has had no further episodes of pain in the lower sternum or over the driveline site like he experienced during hospitalization.  No fevers, chills.  He has not yet had a flu shot this year.   Review of Systems  Constitutional: Negative for chills and fever.  HENT: Negative for tinnitus.   Eyes: Negative for blurred vision and photophobia.  Respiratory: Negative for cough and sputum production.   Cardiovascular: Negative for chest pain.  Gastrointestinal: Negative for diarrhea, nausea and vomiting.  Genitourinary: Negative for dysuria.  Skin: Negative for rash.  Neurological: Negative for headaches.    Past Medical History:  Diagnosis Date  . Cardiomyopathy, unspecified (HCC)   . CHF (  congestive heart failure) (HCC)   . Chronic back pain   . Enlarged heart   . Gastric ulcer   . Gastroenteritis   . H/O degenerative disc disease     Outpatient Medications Prior to Visit  Medication Sig Dispense Refill  . aspirin EC 81 MG tablet Take 81 mg by mouth daily.    Marland Kitchen atorvastatin (LIPITOR) 20 MG tablet Take 1 tablet (20 mg total) by  mouth daily. 90 tablet 3  . carbamide peroxide (DEBROX) 6.5 % OTIC solution Place 5 drops into both ears 3 (three) times daily. 15 mL 0  . cyanocobalamin 500 MCG tablet Take 1,000 mcg by mouth daily.     Marland Kitchen docusate sodium (COLACE) 100 MG capsule Take 2 capsules (200 mg total) by mouth daily as needed for mild constipation. 10 capsule 0  . DULoxetine (CYMBALTA) 60 MG capsule Take 60 mg by mouth daily.    Marland Kitchen enoxaparin (LOVENOX) 40 MG/0.4ML injection Inject 0.4 mLs (40 mg total) into the skin daily. 4 mL 1  . fluticasone (FLONASE) 50 MCG/ACT nasal spray Place 1 spray into both nostrils 2 (two) times daily as needed for allergies.     Marland Kitchen gabapentin (NEURONTIN) 300 MG capsule Take 600 mg by mouth 3 (three) times daily.    Marland Kitchen glucose blood (CONTOUR NEXT TEST) test strip Use as instructed 100 each 12  . hydrOXYzine (VISTARIL) 25 MG capsule Take 25 mg by mouth 3 (three) times daily as needed for anxiety.    . Ipratropium-Albuterol (COMBIVENT) 20-100 MCG/ACT AERS respimat Inhale 1 puff into the lungs every 6 (six) hours.    . Lancets (FREESTYLE) lancets Use as instructed 100 each 12  . metFORMIN (GLUCOPHAGE-XR) 500 MG 24 hr tablet Take 500 mg by mouth 2 (two) times daily.    . methocarbamol (ROBAXIN) 750 MG tablet Take 750 mg by mouth 3 (three) times daily as needed for muscle spasms.     . nicotine (NICODERM CQ - DOSED IN MG/24 HR) 7 mg/24hr patch Place 1 patch (7 mg total) onto the skin daily as needed (tobacco craving). 28 patch 0  . nystatin (MYCOSTATIN/NYSTOP) powder Use with VAD dressing changes daily, advance as directed. 15 g 3  . omeprazole (PRILOSEC) 20 MG capsule Take 20 mg by mouth daily.    . polyethylene glycol (MIRALAX / GLYCOLAX) packet Take 17 g by mouth daily as needed for mild constipation.    . sacubitril-valsartan (ENTRESTO) 49-51 MG Take 1 tablet by mouth 2 (two) times daily. 60 tablet 11  . sildenafil (VIAGRA) 100 MG tablet Take 50 mg by mouth daily as needed for erectile dysfunction.     Marland Kitchen spironolactone (ALDACTONE) 25 MG tablet Take 1 tablet (25 mg total) by mouth daily. 30 tablet 6  . thiamine 100 MG tablet Take 1 tablet (100 mg total) by mouth daily. 30 tablet 0  . traMADol (ULTRAM) 50 MG tablet Take 1 tablet (50 mg total) by mouth every 4 (four) hours as needed for moderate pain. 30 tablet 0  . warfarin (COUMADIN) 5 MG tablet Take 5-7.5 mg by mouth See admin instructions. Take  1 and 1/2 tablets (7.5 mg) Tue, Thur and Sat Take 1 tablet by mouth all other days Or as directed by [facility] clinic    . Zinc Gluconate 100 MG TABS Take 1 tablet (100 mg total) by mouth daily. (Patient taking differently: Take 100 mg by mouth 2 (two) times a day. ) 90 tablet 3  . zolpidem (AMBIEN) 5 MG  tablet Take 1 tablet (5 mg total) by mouth at bedtime. 30 tablet 3  . cephALEXin (KEFLEX) 500 MG capsule Take 1 capsule (500 mg total) by mouth 3 (three) times daily. 90 capsule 2  . amLODipine (NORVASC) 5 MG tablet Take 1 tablet (5 mg total) by mouth daily. 180 tablet 3   No facility-administered medications prior to visit.      Allergies  Allergen Reactions  . Chlorhexidine Rash    Social History   Tobacco Use  . Smoking status: Former Smoker    Types: Cigarettes    Start date: 2003  . Smokeless tobacco: Never Used  . Tobacco comment: Nicoderm patch   Substance Use Topics  . Alcohol use: Not Currently  . Drug use: Not Currently    No family history on file.  Objective:   Vitals:   08/12/19 0940  Temp: 98 F (36.7 C)   There is no height or weight on file to calculate BMI.  Physical Exam Vitals signs reviewed.  Constitutional:      Appearance: He is well-developed.     Comments: Seated comfortably in chair during visit. Pleasant and in good spirits. Well-appearing.   HENT:     Mouth/Throat:     Dentition: Normal dentition. No dental abscesses.  Cardiovascular:     Rate and Rhythm: Normal rate and regular rhythm.     Comments: LVAD humm, no native heart tones heard.   Pulmonary:     Effort: Pulmonary effort is normal.     Breath sounds: Normal breath sounds.  Abdominal:     Palpations: Abdomen is soft.     Tenderness: There is no abdominal tenderness.     Comments: Abdominal dressing is clean and dry.  Did not observe site as he was at the [facility] clinic yesterday for care.  I have reviewed pictures from the last several encounters with their team.  Lymphadenopathy:     Cervical: No cervical adenopathy.  Skin:    General: Skin is warm and dry.     Findings: No rash.  Neurological:     Mental Status: He is alert and oriented to person, place, and time.  Psychiatric:        Judgment: Judgment normal.     Lab Results: Lab Results  Component Value Date   WBC 7.2 07/14/2019   HGB 11.5 (L) 07/14/2019   HCT 38.5 (L) 07/14/2019   MCV 81.1 07/14/2019   PLT 181 07/14/2019    Lab Results  Component Value Date   CREATININE 1.00 07/14/2019   BUN 11 07/14/2019   NA 138 07/14/2019   K 3.7 07/14/2019   CL 107 07/14/2019   CO2 19 (L) 07/14/2019    Sed Rate (mm/hr)  Date Value  05/05/2019 4   CRP (mg/dL)  Date Value  05/05/2019 <0.8     Assessment & Plan:   Problem List Items Addressed This Visit      High   LVAD (left ventricular assist device) present ([place])   Infection associated with driveline of left ventricular assist device (LVAD) ([place])    He has a history of driveline infection with secondary bacteremia due to MSSA.  We discussed again how there was a high likelihood that his pump became infected and would presume this to be the case. He is doing well on his 3 times a day Keflex.  I may consider dropping him down to 500 twice a day for chronic suppression after at least a year of treatment dosing.  Will review and discuss further with pharmacy team.    I reviewed all previous clinic encounters with the vent team since his prior visit with me and notice waxing and waning drainage and hyper granulation tissue.  Of course with his wound on  the abdomen not being fully healed he is at risk for acquiring other bacterial infections.  Would continue to monitor drainage and signs of pain to determine need for further cultures.  His white blood cell counts are normal and he has had no symptoms indicating relapsing bacteremia today.  We will have him come back in 9 months.  Provided him with refills of Keflex      Relevant Medications   cephALEXin (KEFLEX) 500 MG capsule     Unprioritized   Vaccine counseling    I offered him a flu shot today.  He was just given intravenous iron yesterday would like a break from needles.  I will make him an appointment in 3 weeks for a injection with our clinic team.  We also discussed given his age he would be a good candidate for shingles vaccine.  I asked him to [facility] to see if they have the dose.  Many pharmacies now have standard orders to administer.  He and his wife will both consider this.         Rexene Alberts, MSN, NP-C [facility]  [email or web address] Pager: [phone] Office: [phone] [facility] Main Line: [phone]

## 2019-08-18 ENCOUNTER — Ambulatory Visit (HOSPITAL_COMMUNITY)
Admission: RE | Admit: 2019-08-18 | Discharge: 2019-08-18 | Disposition: A | Discharge status: Home / Self Care | Payer: No Typology Code available for payment source | Source: Ambulatory Visit | Attending: Cardiology | Admitting: Cardiology

## 2019-08-18 ENCOUNTER — Other Ambulatory Visit: Payer: Self-pay

## 2019-08-18 DIAGNOSIS — E611 Iron deficiency: Secondary | ICD-10-CM | POA: Insufficient documentation

## 2019-08-18 DIAGNOSIS — Z95811 Presence of heart assist device: Secondary | ICD-10-CM | POA: Insufficient documentation

## 2019-08-18 MED ORDER — SODIUM CHLORIDE 0.9 % IV SOLN
510.0000 mg | INTRAVENOUS | Status: DC
  Administered 2019-08-18: 510 mg via INTRAVENOUS
  Filled 2019-08-18: qty 510

## 2019-08-20 ENCOUNTER — Other Ambulatory Visit (HOSPITAL_COMMUNITY): Payer: Self-pay | Admitting: *Deleted

## 2019-08-20 DIAGNOSIS — Z7901 Long term (current) use of anticoagulants: Secondary | ICD-10-CM

## 2019-08-20 DIAGNOSIS — Z95811 Presence of heart assist device: Secondary | ICD-10-CM

## 2019-08-25 ENCOUNTER — Other Ambulatory Visit (HOSPITAL_COMMUNITY): Payer: Self-pay | Admitting: *Deleted

## 2019-08-25 ENCOUNTER — Ambulatory Visit (HOSPITAL_COMMUNITY)
Admission: RE | Admit: 2019-08-25 | Discharge: 2019-08-25 | Disposition: A | Discharge status: Home / Self Care | Payer: No Typology Code available for payment source | Source: Ambulatory Visit | Attending: Internal Medicine | Admitting: Internal Medicine

## 2019-08-25 ENCOUNTER — Ambulatory Visit (HOSPITAL_COMMUNITY): Payer: Self-pay | Admitting: Pharmacist

## 2019-08-25 ENCOUNTER — Other Ambulatory Visit: Payer: Self-pay

## 2019-08-25 DIAGNOSIS — Z95811 Presence of heart assist device: Secondary | ICD-10-CM

## 2019-08-25 DIAGNOSIS — T827XXA Infection and inflammatory reaction due to other cardiac and vascular devices, implants and grafts, initial encounter: Secondary | ICD-10-CM

## 2019-08-25 DIAGNOSIS — Z7901 Long term (current) use of anticoagulants: Secondary | ICD-10-CM

## 2019-08-25 LAB — PROTIME-INR
INR: 2.3 — ABNORMAL HIGH (ref 0.8–1.2)
Prothrombin Time: 25 seconds — ABNORMAL HIGH (ref 11.4–15.2)

## 2019-08-25 NOTE — Addendum Note (Signed)
Encounter addended by: Mertha Baars, RN on: 08/25/2019 9:41 AM  Actions taken: Pend clinical note

## 2019-08-25 NOTE — Progress Notes (Signed)
Patient presents for dressing change with wound check in [facility] today with his wife [name]. Reports no problems with VAD equipment or concerns with drive line. Denies any falls, dizziness, lightheadedness, or syncope.   Per [name] patient was out of town over the weekend and did not have his dressing changed. She noted foul odor and "blister" underneath drive line site when she changed dressing on Monday. She states she has been cleaning with betadine but has not been scrubbing around exit site opening due to not wanting to discolor the drive line velour. Educated on importance of friction scrub directly at site. Reassured that it was ok that exposed velour may become discolored due to betadine. She states she has been rubbing alcohol pad around exit site "to dry up the area." Requested she stop doing that as this may be causing inflammation at site.   Exit Site Care: Drive line is being maintained daily by [name]; last dressing change was performed yesterday. Existing dressing removed using sterile technique. Pt has small amount of sticky green-yellow drainage on gauze. No odor noted. Site cleansed with betadine swab x 2.  No silver strip applied due to potential silver strip allergy. Topped w/ dry gauze and occlusive tape; anchor secure. No tunneling noted. Pt provided with 10 daily kits, a box of betadine swabs, and a box of adhesive remover. Continue Keflex 500mg  TID.         Return to [place] clinic in 1 week for dressing change and INR. Will order CT abdomen/pelvis with contrast to evaluate drive line site.    Emerson Monte RN [place] Coordinator  Office: [phone]  24/7 Pager: [phone]

## 2019-08-25 NOTE — Addendum Note (Signed)
Encounter addended by: Mertha Baars, RN on: 08/25/2019 9:52 AM  Actions taken: Clinical Note Signed

## 2019-08-25 NOTE — Progress Notes (Signed)
LVAD INR 

## 2019-08-26 ENCOUNTER — Ambulatory Visit (HOSPITAL_COMMUNITY)
Admission: RE | Admit: 2019-08-26 | Discharge: 2019-08-26 | Disposition: A | Discharge status: Home / Self Care | Payer: Medicare HMO | Source: Ambulatory Visit | Attending: Internal Medicine | Admitting: Internal Medicine

## 2019-08-26 DIAGNOSIS — Z95811 Presence of heart assist device: Secondary | ICD-10-CM | POA: Diagnosis not present

## 2019-08-26 DIAGNOSIS — T827XXA Infection and inflammatory reaction due to other cardiac and vascular devices, implants and grafts, initial encounter: Secondary | ICD-10-CM | POA: Diagnosis not present

## 2019-08-26 LAB — BASIC METABOLIC PANEL
Anion gap: 8 (ref 5–15)
BUN: 9 mg/dL (ref 6–20)
CO2: 23 mmol/L (ref 22–32)
Calcium: 8.9 mg/dL (ref 8.9–10.3)
Chloride: 109 mmol/L (ref 98–111)
Creatinine, Ser: 0.96 mg/dL (ref 0.61–1.24)
GFR calc Af Amer: >60 mL/min (ref >60)
GFR calc non Af Amer: >60 mL/min (ref >60)
Glucose, Bld: 113 mg/dL — ABNORMAL HIGH (ref 70–99)
Potassium: 4.1 mmol/L (ref 3.5–5.1)
Sodium: 140 mmol/L (ref 135–145)

## 2019-08-27 ENCOUNTER — Other Ambulatory Visit (HOSPITAL_COMMUNITY): Payer: Self-pay | Admitting: *Deleted

## 2019-08-27 ENCOUNTER — Ambulatory Visit (HOSPITAL_COMMUNITY): Payer: Medicare HMO

## 2019-08-27 DIAGNOSIS — Z7901 Long term (current) use of anticoagulants: Secondary | ICD-10-CM

## 2019-08-27 DIAGNOSIS — Z95811 Presence of heart assist device: Secondary | ICD-10-CM

## 2019-08-31 ENCOUNTER — Other Ambulatory Visit (HOSPITAL_COMMUNITY): Payer: Self-pay | Admitting: *Deleted

## 2019-08-31 ENCOUNTER — Other Ambulatory Visit: Payer: Self-pay

## 2019-08-31 ENCOUNTER — Other Ambulatory Visit (HOSPITAL_COMMUNITY): Payer: No Typology Code available for payment source

## 2019-08-31 ENCOUNTER — Emergency Department (HOSPITAL_COMMUNITY)
Admission: EM | Admit: 2019-08-31 | Discharge: 2019-08-31 | Disposition: A | Discharge status: Home / Self Care | Payer: No Typology Code available for payment source | Attending: Emergency Medicine | Admitting: Emergency Medicine

## 2019-08-31 ENCOUNTER — Ambulatory Visit (HOSPITAL_COMMUNITY)
Admission: RE | Admit: 2019-08-31 | Discharge: 2019-08-31 | Disposition: A | Discharge status: Home / Self Care | Payer: Medicare HMO | Source: Ambulatory Visit | Attending: Internal Medicine | Admitting: Internal Medicine

## 2019-08-31 ENCOUNTER — Emergency Department (HOSPITAL_COMMUNITY): Payer: No Typology Code available for payment source

## 2019-08-31 ENCOUNTER — Ambulatory Visit (HOSPITAL_COMMUNITY): Payer: Self-pay | Admitting: Pharmacist

## 2019-08-31 ENCOUNTER — Telehealth (HOSPITAL_COMMUNITY): Payer: Self-pay | Admitting: *Deleted

## 2019-08-31 ENCOUNTER — Encounter (HOSPITAL_COMMUNITY): Payer: Self-pay

## 2019-08-31 DIAGNOSIS — Z7901 Long term (current) use of anticoagulants: Secondary | ICD-10-CM | POA: Diagnosis present

## 2019-08-31 DIAGNOSIS — Y999 Unspecified external cause status: Secondary | ICD-10-CM | POA: Insufficient documentation

## 2019-08-31 DIAGNOSIS — Z7982 Long term (current) use of aspirin: Secondary | ICD-10-CM | POA: Insufficient documentation

## 2019-08-31 DIAGNOSIS — Y939 Activity, unspecified: Secondary | ICD-10-CM | POA: Insufficient documentation

## 2019-08-31 DIAGNOSIS — S161XXA Strain of muscle, fascia and tendon at neck level, initial encounter: Secondary | ICD-10-CM

## 2019-08-31 DIAGNOSIS — Z95811 Presence of heart assist device: Secondary | ICD-10-CM | POA: Diagnosis present

## 2019-08-31 DIAGNOSIS — Z86718 Personal history of other venous thrombosis and embolism: Secondary | ICD-10-CM | POA: Diagnosis not present

## 2019-08-31 DIAGNOSIS — I5022 Chronic systolic (congestive) heart failure: Secondary | ICD-10-CM | POA: Insufficient documentation

## 2019-08-31 DIAGNOSIS — T827XXA Infection and inflammatory reaction due to other cardiac and vascular devices, implants and grafts, initial encounter: Secondary | ICD-10-CM | POA: Diagnosis present

## 2019-08-31 DIAGNOSIS — Y929 Unspecified place or not applicable: Secondary | ICD-10-CM | POA: Diagnosis not present

## 2019-08-31 DIAGNOSIS — T07XXXA Unspecified multiple injuries, initial encounter: Secondary | ICD-10-CM | POA: Diagnosis present

## 2019-08-31 DIAGNOSIS — S39012A Strain of muscle, fascia and tendon of lower back, initial encounter: Secondary | ICD-10-CM | POA: Diagnosis not present

## 2019-08-31 DIAGNOSIS — I110 Hypertensive heart disease with heart failure: Secondary | ICD-10-CM | POA: Diagnosis not present

## 2019-08-31 DIAGNOSIS — I429 Cardiomyopathy, unspecified: Secondary | ICD-10-CM | POA: Diagnosis not present

## 2019-08-31 DIAGNOSIS — Z79899 Other long term (current) drug therapy: Secondary | ICD-10-CM | POA: Insufficient documentation

## 2019-08-31 DIAGNOSIS — Z87891 Personal history of nicotine dependence: Secondary | ICD-10-CM | POA: Diagnosis not present

## 2019-08-31 HISTORY — DX: Presence of heart assist device: Z95.811

## 2019-08-31 LAB — BASIC METABOLIC PANEL
Anion gap: 11 (ref 5–15)
BUN: 13 mg/dL (ref 6–20)
CO2: 19 mmol/L — ABNORMAL LOW (ref 22–32)
Calcium: 8.8 mg/dL — ABNORMAL LOW (ref 8.9–10.3)
Chloride: 105 mmol/L (ref 98–111)
Creatinine, Ser: 0.89 mg/dL (ref 0.61–1.24)
GFR calc Af Amer: >60 mL/min (ref >60)
GFR calc non Af Amer: >60 mL/min (ref >60)
Glucose, Bld: 94 mg/dL (ref 70–99)
Potassium: 3.7 mmol/L (ref 3.5–5.1)
Sodium: 135 mmol/L (ref 135–145)

## 2019-08-31 LAB — PROTIME-INR
INR: 1.9 — ABNORMAL HIGH (ref 0.8–1.2)
Prothrombin Time: 21.3 seconds — ABNORMAL HIGH (ref 11.4–15.2)

## 2019-08-31 LAB — CBC
HCT: 40.8 % (ref 39.0–52.0)
Hemoglobin: 12.9 g/dL — ABNORMAL LOW (ref 13.0–17.0)
MCH: 26.9 pg (ref 26.0–34.0)
MCHC: 31.6 g/dL (ref 30.0–36.0)
MCV: 85 fL (ref 80.0–100.0)
Platelets: 188 10*3/uL (ref 150–400)
RBC: 4.8 MIL/uL (ref 4.22–5.81)
RDW: 20.1 % — ABNORMAL HIGH (ref 11.5–15.5)
WBC: 7.1 10*3/uL (ref 4.0–10.5)
nRBC: 0 % (ref 0.0–0.2)

## 2019-08-31 LAB — LACTATE DEHYDROGENASE: LDH: 205 U/L — ABNORMAL HIGH (ref 98–192)

## 2019-08-31 MED ORDER — TRAMADOL HCL 50 MG PO TABS: 50.0000 mg | ORAL_TABLET | Freq: Four times a day (QID) | ORAL | 0 refills | Status: DC | PRN

## 2019-08-31 MED ORDER — IOHEXOL 300 MG/ML  SOLN
100.0000 mL | Freq: Once | INTRAMUSCULAR | Status: AC | PRN
  Administered 2019-08-31: 100 mL via INTRAVENOUS

## 2019-08-31 NOTE — ED Notes (Signed)
Patient verbalizes understanding of discharge instructions. Opportunity for questioning and answers were provided. Armband removed by staff, pt discharged from ED ambulatory to home.  
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
## 2019-08-31 NOTE — Discharge Instructions (Signed)
Return here as needed.  Follow-up with your primary doctor.  Use ice and heat on your neck and back.

## 2019-08-31 NOTE — Addendum Note (Signed)
Encounter addended by: Mertha Baars, RN on: 08/31/2019 12:21 PM  Actions taken: Clinical Note Signed
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
## 2019-08-31 NOTE — Progress Notes (Signed)
LVAD INR 

## 2019-09-01 ENCOUNTER — Other Ambulatory Visit (HOSPITAL_COMMUNITY): Payer: No Typology Code available for payment source

## 2019-09-02 ENCOUNTER — Ambulatory Visit: Payer: No Typology Code available for payment source

## 2019-09-03 ENCOUNTER — Other Ambulatory Visit (HOSPITAL_COMMUNITY): Payer: Self-pay | Admitting: Unknown Physician Specialty

## 2019-09-03 DIAGNOSIS — Z7901 Long term (current) use of anticoagulants: Secondary | ICD-10-CM

## 2019-09-03 DIAGNOSIS — Z95811 Presence of heart assist device: Secondary | ICD-10-CM
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
## 2019-09-06 ENCOUNTER — Other Ambulatory Visit: Payer: Self-pay

## 2019-09-06 ENCOUNTER — Ambulatory Visit (INDEPENDENT_AMBULATORY_CARE_PROVIDER_SITE_OTHER): Payer: No Typology Code available for payment source

## 2019-09-06 DIAGNOSIS — Z23 Encounter for immunization: Secondary | ICD-10-CM | POA: Diagnosis not present

## 2019-09-08 ENCOUNTER — Ambulatory Visit (HOSPITAL_COMMUNITY): Payer: Self-pay | Admitting: Pharmacist

## 2019-09-08 ENCOUNTER — Other Ambulatory Visit: Payer: Self-pay

## 2019-09-08 ENCOUNTER — Ambulatory Visit (HOSPITAL_COMMUNITY)
Admission: RE | Admit: 2019-09-08 | Discharge: 2019-09-08 | Disposition: A | Discharge status: Home / Self Care | Payer: No Typology Code available for payment source | Source: Ambulatory Visit | Attending: Internal Medicine | Admitting: Internal Medicine

## 2019-09-08 DIAGNOSIS — Z95811 Presence of heart assist device: Secondary | ICD-10-CM

## 2019-09-08 DIAGNOSIS — Z7901 Long term (current) use of anticoagulants: Secondary | ICD-10-CM

## 2019-09-08 DIAGNOSIS — Z4800 Encounter for change or removal of nonsurgical wound dressing: Secondary | ICD-10-CM | POA: Diagnosis not present

## 2019-09-08 LAB — PROTIME-INR
INR: 2.7 — ABNORMAL HIGH (ref 0.8–1.2)
Prothrombin Time: 27.9 seconds — ABNORMAL HIGH (ref 11.4–15.2)

## 2019-09-08 NOTE — Progress Notes (Signed)
Patient presents for dressing change with wound check in [facility] today with his wife [name]. Reports no problems with VAD equipment or concerns with drive line.    Exit Site Care: Drive line is being maintained daily by [name]; last dressing change was performed yesterday. Existing dressing removed using sterile technique. Pt has small amount of  green-yellow drainage on gauze. No odor noted. Site cleansed with betadine swab x 2.  No silver strip applied due to potential silver strip allergy. Topped w/ dry gauze and occlusive tape; anchor secure. No tunneling noted. Pt provided with 12 daily kits. Continue Keflex 500mg  TID.        Return to [place] clinic in 1 week for dressing change and INR.   Tanda Rockers RN [place] Coordinator  Office: [phone]  24/7 Pager: [phone]

## 2019-09-08 NOTE — Progress Notes (Signed)
LVAD INR 

## 2019-09-08 NOTE — Addendum Note (Signed)
Encounter addended by: [name], RN on: 09/08/2019 2:27 PM  Actions taken: Clinical Note Signed

## 2019-09-14 ENCOUNTER — Other Ambulatory Visit (HOSPITAL_COMMUNITY): Payer: Self-pay | Admitting: *Deleted

## 2019-09-14 DIAGNOSIS — Z95811 Presence of heart assist device: Secondary | ICD-10-CM

## 2019-09-14 DIAGNOSIS — Z7901 Long term (current) use of anticoagulants: Secondary | ICD-10-CM

## 2019-09-14 DIAGNOSIS — E611 Iron deficiency: Secondary | ICD-10-CM

## 2019-09-15 ENCOUNTER — Other Ambulatory Visit: Payer: Self-pay

## 2019-09-15 ENCOUNTER — Encounter (HOSPITAL_COMMUNITY): Payer: Self-pay

## 2019-09-15 ENCOUNTER — Ambulatory Visit (HOSPITAL_COMMUNITY): Payer: Self-pay | Admitting: Pharmacist

## 2019-09-15 ENCOUNTER — Ambulatory Visit (HOSPITAL_COMMUNITY)
Admission: RE | Admit: 2019-09-15 | Discharge: 2019-09-15 | Disposition: A | Discharge status: Home / Self Care | Payer: No Typology Code available for payment source | Source: Ambulatory Visit | Attending: Internal Medicine | Admitting: Internal Medicine

## 2019-09-15 VITALS — BP 134/92 | HR 75 | Temp 98.6°F | Ht 71.0 in | Wt 192.0 lb

## 2019-09-15 DIAGNOSIS — Z7901 Long term (current) use of anticoagulants: Secondary | ICD-10-CM | POA: Insufficient documentation

## 2019-09-15 DIAGNOSIS — Z86718 Personal history of other venous thrombosis and embolism: Secondary | ICD-10-CM | POA: Insufficient documentation

## 2019-09-15 DIAGNOSIS — E119 Type 2 diabetes mellitus without complications: Secondary | ICD-10-CM | POA: Diagnosis not present

## 2019-09-15 DIAGNOSIS — I5022 Chronic systolic (congestive) heart failure: Secondary | ICD-10-CM | POA: Diagnosis not present

## 2019-09-15 DIAGNOSIS — K219 Gastro-esophageal reflux disease without esophagitis: Secondary | ICD-10-CM | POA: Diagnosis not present

## 2019-09-15 DIAGNOSIS — E785 Hyperlipidemia, unspecified: Secondary | ICD-10-CM | POA: Insufficient documentation

## 2019-09-15 DIAGNOSIS — Z7982 Long term (current) use of aspirin: Secondary | ICD-10-CM | POA: Diagnosis not present

## 2019-09-15 DIAGNOSIS — Z79899 Other long term (current) drug therapy: Secondary | ICD-10-CM | POA: Insufficient documentation

## 2019-09-15 DIAGNOSIS — Z95811 Presence of heart assist device: Secondary | ICD-10-CM

## 2019-09-15 DIAGNOSIS — Z7984 Long term (current) use of oral hypoglycemic drugs: Secondary | ICD-10-CM | POA: Diagnosis not present

## 2019-09-15 DIAGNOSIS — E611 Iron deficiency: Secondary | ICD-10-CM | POA: Insufficient documentation

## 2019-09-15 DIAGNOSIS — F17210 Nicotine dependence, cigarettes, uncomplicated: Secondary | ICD-10-CM | POA: Diagnosis not present

## 2019-09-15 DIAGNOSIS — I428 Other cardiomyopathies: Secondary | ICD-10-CM | POA: Diagnosis not present

## 2019-09-15 DIAGNOSIS — G4733 Obstructive sleep apnea (adult) (pediatric): Secondary | ICD-10-CM | POA: Insufficient documentation

## 2019-09-15 DIAGNOSIS — K7460 Unspecified cirrhosis of liver: Secondary | ICD-10-CM | POA: Diagnosis not present

## 2019-09-15 DIAGNOSIS — Z7951 Long term (current) use of inhaled steroids: Secondary | ICD-10-CM | POA: Diagnosis not present

## 2019-09-15 LAB — CBC
HCT: 41.5 % (ref 39.0–52.0)
Hemoglobin: 13.6 g/dL (ref 13.0–17.0)
MCH: 28 pg (ref 26.0–34.0)
MCHC: 32.8 g/dL (ref 30.0–36.0)
MCV: 85.4 fL (ref 80.0–100.0)
Platelets: 184 10*3/uL (ref 150–400)
RBC: 4.86 MIL/uL (ref 4.22–5.81)
RDW: 19.6 % — ABNORMAL HIGH (ref 11.5–15.5)
WBC: 7.2 10*3/uL (ref 4.0–10.5)
nRBC: 0 % (ref 0.0–0.2)

## 2019-09-15 LAB — FERRITIN: Ferritin: 366 ng/mL — ABNORMAL HIGH (ref 24–336)

## 2019-09-15 LAB — FOLATE: Folate: 18.5 ng/mL (ref >5.9)

## 2019-09-15 LAB — BASIC METABOLIC PANEL
Anion gap: 11 (ref 5–15)
BUN: 9 mg/dL (ref 6–20)
CO2: 22 mmol/L (ref 22–32)
Calcium: 9.1 mg/dL (ref 8.9–10.3)
Chloride: 104 mmol/L (ref 98–111)
Creatinine, Ser: 0.93 mg/dL (ref 0.61–1.24)
GFR calc Af Amer: >60 mL/min (ref >60)
GFR calc non Af Amer: >60 mL/min (ref >60)
Glucose, Bld: 105 mg/dL — ABNORMAL HIGH (ref 70–99)
Potassium: 3.5 mmol/L (ref 3.5–5.1)
Sodium: 137 mmol/L (ref 135–145)

## 2019-09-15 LAB — LACTATE DEHYDROGENASE: LDH: 221 U/L — ABNORMAL HIGH (ref 98–192)

## 2019-09-15 LAB — PROTIME-INR
INR: 2 — ABNORMAL HIGH (ref 0.8–1.2)
Prothrombin Time: 22.5 seconds — ABNORMAL HIGH (ref 11.4–15.2)

## 2019-09-15 LAB — IRON AND TIBC
Iron: 50 ug/dL (ref 45–182)
Saturation Ratios: 13 % — ABNORMAL LOW (ref 17.9–39.5)
TIBC: 391 ug/dL (ref 250–450)
UIBC: 341 ug/dL

## 2019-09-15 LAB — VITAMIN B12: Vitamin B-12: 758 pg/mL (ref 180–914)

## 2019-09-15 MED ORDER — SACUBITRIL-VALSARTAN 97-103 MG PO TABS: 1.0000 | ORAL_TABLET | Freq: Two times a day (BID) | ORAL | 6 refills | Status: DC

## 2019-09-15 NOTE — Progress Notes (Signed)
LVAD INR 

## 2019-09-15 NOTE — Patient Instructions (Signed)
1. Increase Entresto dose - Rx sent 2. Continue same dose of warfarin 3. Return in 2 weeks for BP check, labs, and dsg change 4. Return in 2 mos for full visitl

## 2019-09-15 NOTE — Progress Notes (Addendum)
Patient presents for 2 mo f/u in [facility] today by himself due to Covid-19 restrictions. Reports no problems with VAD equipment or concerns with drive line. Denies any falls, dizziness, lightheadedness, or syncope.   Pt's BP elevated this am; he reports he took all of his am meds prior to visit this am. Per Dr. Shirlee Latch, will increase Entresto.   Pt says he has been feeling fine, has no limitations with his daily activities.He does report falling last week, his "feet got tangled up" and hit his head. Healing abrasion noted on right side of forehead; pt denies any neuro changes, headaches, or vision changes.   His wife has continued daily dressing changes and he denies any issues with VAD drive line exit site.  Vital Signs:  Temp:  98.6 Doppler Pressure: 112 Automatc BP:  134/92 (107) HR:  75 SPO2: 100% RA  Weight: 192 lb w/o eqt Last weight: 191.4 lb  VAD Indication: Destination Therapy due to smoking status  VAD interrogation & Equipment Management: Speed: 5700 Flow:  4.2 Power:  4.4w    PI: 6.5 Hct: 33  Alarms: no clinical alarms Events:  60 PI events daily Fixed speed: 5700 Low speed limit: 5400  Primary Controller:  Replace back up battery in 18 months. Back up controller:   Replace back up battery in 29 months.  Annual Equipment Maintenance on UBC/PM was performed on 07/2018.   Back up battery charged in back up controller during clinic visit.   I reviewed the LVAD parameters from today and compared the results to the patient's prior recorded data. LVAD interrogation was NEGATIVE for significant power changes, NEGATIVE for clinical alarms and NEGATIVE for PI events/speed drops. No programming changes were made and pump is functioning within specified parameters. Pt is performing daily controller and system monitor self tests along with completing weekly and monthly maintenance for LVAD equipment.  LVAD equipment check completed and is in good working order. Back-up  equipment present.   Exit Site Care: Drive line is being maintained daily by [name]. Gauze dressing with anchor intact and accurately applied.  Existing dressing removed using sterile technique. Pt has small amount of old bloody drainage on the silver strips with no foul odor, rash, or tenderness noted. Exit site with complete tissue ingrowth, no tunneling noted. Pt provided with 13 daily dressing kits and 5 anchors. Instructed to continue daily dressing changes.  Device: N/A   BP & Labs:  Doppler 112 - Doppler is reflecting MAP   Hgb 13.6  - No S/S of bleeding. Specifically denies melena/BRBPR or nosebleeds.   LDH stable at 221 with established baseline of 150 - 220. Denies tea-colored urine. No power elevations noted on interrogation.   Patient Instructions: 1. Increase Entresto dose; Rx sent  2. Continue same dose of warfarin 2. Return in two weeks for BP check, labs, and dressing change  3. Return in 2 mos for full visit  Hessie Diener RN VAD Coordinator  Office: [phone]  24/7 Pager: [phone]   58 y.o. with history of nonischemic cardiomyopathy, RLE DVT, cirrhosis, smoking, and OSA returns for followup of CHF/LVAD placement.  Cardiomyopathy was diagnosed in 6/19 in [place], [place] at that time showed low output.  He was admitted to [facility] in 9/19 with low output HF and was started on milrinone and diuresed.  Unable to wean off milrinone.  He had a degree of RV failure, but this improved on milrinone.  Valvular heart disease also looked better with milrinone and diuresis.  On 08/03/18, he had Heartmate 3 LVAD placed.  Speed was optimized by ramp echo post-op.  Post-op course was relatively unremarkable.  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  No abscess on CT.  He was started on cefazolin IV for 6 wks.  BP-active meds decreased with low MAP.   He is now on chronic Keflex for MSSA  suppression.  Symptomatically doing well.  No significant dyspnea with ADLs and walking on flat ground.  MAP is elevated, increased PI events recently without suction (may be due to HTN).  Still smoking some but trying to quit.  Driveline site doing ok with minimal drainage.   Denies LVAD alarms.  Denies driveline trauma, erythema or drainage.  Denies ICD shocks.   Reports taking Coumadin as prescribed and adherence to anticoagulation based dietary restrictions.  Denies bright red blood per rectum or melena, no dark urine or hematuria.    Labs (9/19): LDH 210, INR 2.17, WBCs 17.1 => 15, hgb 9.7, creatinine 0.72 Labs (10/19): K 4.3, creatinine 0.78 => 0.83, hgb 10.2 Labs (11/19): creatinine 0.79, hgb 10 Labs (2/20): K 3.9, creatinine 0.79 Labs (4/20): K 4, creatinine 1.15, hgb 11.4, LDH 199, INR 1.9 Labs (5/20): hgb 10.4, WBCs 8.5 Labs (6/20): K 4.2, creatinine 1.06, hgb 11.2 Labs (8/20): k 3.7, creatinine 1.0, hgb 11.5, LDH 186 Labs (10/20): K 3.5, creatinine 0.93, INR 2, LDH 221  PMH: 1. Degenerative disc disease.  2. GERD 3. Chronic systolic CHF:  Nonischemic cardiomyopathy.  Dilated cardiomyopathy diagnosed 6/19 in [place].  LHC/RHC in 7/19 showed elevated filling pressures, low cardiac output, and no significant CAD.   - Echo (9/19): Severe LV dilation with EF 10-20%, moderate-severe MR, severely dilated RV with mildly decreased systolic function, severe TR.  - Cardiac MRI (9/19): EF 14%, moderate dilated LV, severely dilated RV with mod-severe systolic function and EF 10%, nonspecific RV insertion site LGE.  - RHC (5/19) on milrinone 0.375: mean RA 8, PA 40/19, mean PCWP 12, PAPi 2.65, CI 2.71.  - Echo (9/19) on milrinone and diuresed): EF 20-25% with moderate LV dilation, moderately dilated RV with mildly decreased systolic function, mild-moderate MR, moderate TR, PASP 51 mmHg.  Cannot rule out noncompaction.  - Heartmate 3 LVAD placement in 9/19.  4. OSA: CPAP use.  5. Prior  smoker 6. Type 2 diabetes 7. Hyperlipidemia.  8. H/o NSVT 9. 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  10. RLE DVT: found in 9/19.  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   Current Outpatient Medications  Medication Sig Dispense Refill  . aspirin EC 81 MG tablet Take 81 mg by mouth daily.    Marland Kitchen atorvastatin (LIPITOR) 20 MG tablet Take 1 tablet (20 mg total) by mouth daily. 90 tablet 3  . carbamide peroxide (DEBROX) 6.5 % OTIC solution Place 5 drops into both ears 3 (three) times daily. 15 mL 0  . cephALEXin (KEFLEX) 500 MG capsule Take 1 capsule (500 mg total) by mouth 3 (three) times daily. 90 capsule 11  . cyanocobalamin 500 MCG tablet Take 1,000 mcg by mouth daily.     Marland Kitchen docusate sodium (COLACE) 100 MG capsule Take 2 capsules (200 mg total) by mouth daily as needed for mild constipation. 10 capsule 0  . DULoxetine (CYMBALTA) 60 MG capsule Take 60 mg by mouth daily.    . fluticasone (FLONASE) 50 MCG/ACT nasal spray Place  1 spray into both nostrils 2 (two) times daily as needed for allergies.     Marland Kitchen gabapentin (NEURONTIN) 300 MG capsule Take 600 mg by mouth 3 (three) times daily.    Marland Kitchen glucose blood (CONTOUR NEXT TEST) test strip Use as instructed 100 each 12  . hydrOXYzine (VISTARIL) 25 MG capsule Take 25 mg by mouth 3 (three) times daily as needed for anxiety.    . Ipratropium-Albuterol (COMBIVENT) 20-100 MCG/ACT AERS respimat Inhale 1 puff into the lungs every 6 (six) hours.    . Lancets (FREESTYLE) lancets Use as instructed 100 each 12  . metFORMIN (GLUCOPHAGE-XR) 500 MG 24 hr tablet Take 500 mg by mouth 2 (two) times daily.    . methocarbamol (ROBAXIN) 750 MG tablet Take 750 mg by mouth 3 (three) times daily as needed for muscle spasms.     Marland Kitchen nystatin (MYCOSTATIN/NYSTOP) powder Use with VAD dressing changes daily, advance as directed. 15 g 3  . omeprazole (PRILOSEC) 20 MG capsule Take 20 mg by mouth daily.    . polyethylene  glycol (MIRALAX / GLYCOLAX) packet Take 17 g by mouth daily as needed for mild constipation.    . sildenafil (VIAGRA) 100 MG tablet Take 50 mg by mouth daily as needed for erectile dysfunction.    Marland Kitchen spironolactone (ALDACTONE) 25 MG tablet Take 1 tablet (25 mg total) by mouth daily. 30 tablet 6  . thiamine 100 MG tablet Take 1 tablet (100 mg total) by mouth daily. 30 tablet 0  . traMADol (ULTRAM) 50 MG tablet Take 1 tablet (50 mg total) by mouth every 4 (four) hours as needed for moderate pain. 30 tablet 0  . warfarin (COUMADIN) 5 MG tablet Take 5-7.5 mg by mouth See admin instructions. Take  1 and 1/2 tablets (7.5 mg) Tue, Thur and Sat Take 1 tablet by mouth all other days Or as directed by [facility] clinic    . Zinc Gluconate 100 MG TABS Take 1 tablet (100 mg total) by mouth daily. (Patient taking differently: Take 100 mg by mouth 2 (two) times a day. ) 90 tablet 3  . zolpidem (AMBIEN) 5 MG tablet Take 1 tablet (5 mg total) by mouth at bedtime. 30 tablet 3  . amLODipine (NORVASC) 5 MG tablet Take 1 tablet (5 mg total) by mouth daily. 180 tablet 3  . enoxaparin (LOVENOX) 40 MG/0.4ML injection Inject 0.4 mLs (40 mg total) into the skin daily. (Patient not taking: Reported on 09/15/2019) 4 mL 1  . nicotine (NICODERM CQ - DOSED IN MG/24 HR) 7 mg/24hr patch Place 1 patch (7 mg total) onto the skin daily as needed (tobacco craving). (Patient not taking: Reported on 09/15/2019) 28 patch 0  . sacubitril-valsartan (ENTRESTO) 97-103 MG Take 1 tablet by mouth 2 (two) times daily. 60 tablet 6  . traMADol (ULTRAM) 50 MG tablet Take 1 tablet (50 mg total) by mouth every 6 (six) hours as needed for severe pain. 15 tablet 0   No current facility-administered medications for this encounter.     Chlorhexidine  REVIEW OF SYSTEMS: All systems negative except as listed in HPI, PMH and Problem list.   LVAD INTERROGATION:  Please see LVAD nurse's note above for details.    I reviewed the LVAD parameters from today,  and compared the results to the patient's prior recorded data.  No programming changes were made.  The LVAD is functioning within specified parameters.  The patient performs LVAD self-test daily.  LVAD interrogation was negative for any significant power  changes, alarms or PI events/speed drops.  LVAD equipment check completed and is in good working order.  Back-up equipment present.   LVAD education done on emergency procedures and precautions and reviewed exit site care.    MAP 107  Physical Exam: General: Well appearing this am. NAD.  HEENT: Normal. Neck: Supple, JVP 7-8 cm. Carotids OK.  Cardiac:  Mechanical heart sounds with LVAD hum present.  Lungs:  CTAB, normal effort.  Abdomen:  NT, ND, no HSM. No bruits or masses. +BS  LVAD exit site: Well-healed and incorporated. Dressing dry and intact. No erythema or drainage. Stabilization device present and accurately applied. Driveline dressing changed daily per sterile technique. Extremities:  Warm and dry. No cyanosis, clubbing, rash, or edema.  Neuro:  Alert & oriented x 3. Cranial nerves grossly intact. Moves all 4 extremities w/o difficulty. Affect pleasant      ASSESSMENT AND PLAN:   1. 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  LVAD parameters stable but still with multiple PIs, no suction.  Frequent PIs may be related to systemic hypertension.  NYHA class I-II.   He does not look volume overloaded. MAP high today.  - He does not need Lasix.      - Continue spironolactone 25 mg daily.  - Increase Entresto to 97/103 bid.  BMET 2 wks.  - Continue amlodipine 5 mg daily.  - Continue ASA 81 daily.  - Continue warfarin for INR 2-2.5.  No evidence for GI bleeding. Hgb today is stable.  - Should be transplant candidate eventually if he can quit smoking.  Will make transplant clinic referral when he is off totally.  2. Smoking: Still smoking a few cigarettes/day. Knows he needs to quit to be a  transplant candidate and working on it.  3. RLE DVT: On warfarin for LVAD.  4. OSA: Continue CPAP.  5. Hyperlipidemia: Atorvastatin.  6. Type II diabetes: Metformin, per PCP.  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 Driveline looks ok today.   Theodore Welch 09/15/2019

## 2019-09-16 MED ORDER — IOHEXOL 300 MG/ML  SOLN
100.0000 mL | Freq: Once | INTRAMUSCULAR | Status: AC | PRN
  Administered 2019-09-16: 100 mL via INTRAVENOUS

## 2019-09-23 ENCOUNTER — Other Ambulatory Visit (HOSPITAL_COMMUNITY): Payer: Self-pay | Admitting: *Deleted

## 2019-09-23 DIAGNOSIS — Z95811 Presence of heart assist device: Secondary | ICD-10-CM

## 2019-09-23 DIAGNOSIS — Z7901 Long term (current) use of anticoagulants: Secondary | ICD-10-CM

## 2019-09-29 ENCOUNTER — Encounter (HOSPITAL_COMMUNITY): Payer: Self-pay

## 2019-09-29 ENCOUNTER — Other Ambulatory Visit: Payer: Self-pay

## 2019-09-29 ENCOUNTER — Ambulatory Visit (HOSPITAL_COMMUNITY): Payer: Self-pay | Admitting: Pharmacist

## 2019-09-29 ENCOUNTER — Ambulatory Visit (HOSPITAL_COMMUNITY)
Admission: RE | Admit: 2019-09-29 | Discharge: 2019-09-29 | Disposition: A | Discharge status: Home / Self Care | Payer: No Typology Code available for payment source | Source: Ambulatory Visit | Attending: Cardiology | Admitting: Cardiology

## 2019-09-29 VITALS — BP 120/96 | HR 89

## 2019-09-29 DIAGNOSIS — Z95811 Presence of heart assist device: Secondary | ICD-10-CM | POA: Diagnosis present

## 2019-09-29 DIAGNOSIS — I159 Secondary hypertension, unspecified: Secondary | ICD-10-CM

## 2019-09-29 DIAGNOSIS — Z7901 Long term (current) use of anticoagulants: Secondary | ICD-10-CM | POA: Diagnosis not present

## 2019-09-29 LAB — BASIC METABOLIC PANEL
Anion gap: 8 (ref 5–15)
BUN: 14 mg/dL (ref 6–20)
CO2: 22 mmol/L (ref 22–32)
Calcium: 9 mg/dL (ref 8.9–10.3)
Chloride: 109 mmol/L (ref 98–111)
Creatinine, Ser: 0.92 mg/dL (ref 0.61–1.24)
GFR calc Af Amer: >60 mL/min (ref >60)
GFR calc non Af Amer: >60 mL/min (ref >60)
Glucose, Bld: 104 mg/dL — ABNORMAL HIGH (ref 70–99)
Potassium: 4.1 mmol/L (ref 3.5–5.1)
Sodium: 139 mmol/L (ref 135–145)

## 2019-09-29 LAB — PROTIME-INR
INR: 1.5 — ABNORMAL HIGH (ref 0.8–1.2)
Prothrombin Time: 18.1 seconds — ABNORMAL HIGH (ref 11.4–15.2)

## 2019-09-29 MED ORDER — AMLODIPINE BESYLATE 5 MG PO TABS: 10.0000 mg | ORAL_TABLET | Freq: Every day | ORAL | 3 refills | Status: DC

## 2019-09-29 NOTE — Patient Instructions (Signed)
1. Increase Amlodipine to 10mg  daily  2. Coumadin dosing per Ander Purpura PharmD 3. Return to [place] clinic in 2 weeks for labs, BP check, and dressing change

## 2019-09-29 NOTE — Addendum Note (Signed)
Encounter addended by: Mertha Baars, RN on: 09/29/2019 10:03 AM  Actions taken: Vitals modified, Medication List reviewed, Medication taking status modified, Home Medications modified, Order Reconciliation Section accessed, Visit diagnoses modified, Order list changed, Diagnosis association updated, Clinical Note Signed

## 2019-09-29 NOTE — Progress Notes (Signed)
Patient presents for BP check and dressing change in VAD Clinic today with [facility]. Reports no problems with VAD equipment or concerns with drive line. Denies any falls, dizziness, lightheadedness, or syncope.   Pt's BP elevated this am; he reports he took all of his am meds prior to visit this am. Per Dr. [name], will increase Amlodipine.   His wife has continued daily dressing changes and he denies any issues with VAD drive line exit site. [name] reports intermittent yellow drainage.   Vital Signs:  Temp:   Doppler Pressure: 110 Automatc BP:  120/96 (102) HR: 89 SPO2: 99% RA  Weight: 192 lb w/o eqt Last weight: 191.4 lb  VAD Indication: Destination Therapy due to smoking status  VAD interrogation & Equipment Management: Speed: 5700 Flow:  4.3 Power:  4.5w    PI: 5.9 Hct: 33  Alarms: no clinical alarms Events:  84 PI events today Fixed speed: 5700 Low speed limit: 5400  Primary Controller:  Replace back up battery in 17 months. Back up controller:   Replace back up battery in 29 months.  Annual Equipment Maintenance on UBC/PM was performed on 07/2018.   Back up battery charged in back up controller during clinic visit.   I reviewed the LVAD parameters from today and compared the results to the patient's prior recorded data. LVAD interrogation was NEGATIVE for significant power changes, NEGATIVE for clinical alarms and NEGATIVE for PI events/speed drops. No programming changes were made and pump is functioning within specified parameters. Pt is performing daily controller and system monitor self tests along with completing weekly and monthly maintenance for LVAD equipment.  LVAD equipment check completed and is in good working order. Back-up equipment present.   Exit Site Care: Drive line is being maintained daily by [employer]. Gauze dressing with anchor intact and accurately applied.  Existing dressing removed using sterile technique. Pt has small amount of yellow  drainage on the silver strips with no foul odor, rash, or tenderness noted. Exit site with complete tissue ingrowth, no tunneling noted. Pt provided with 13 daily dressing kits, 14 anchors, adhesive remover pads. Instructed to continue daily dressing changes.     Device: N/A   BP & Labs:  Doppler 110 - Doppler is reflecting MAP   Hgb not drawn today  - No S/S of bleeding. Specifically denies melena/BRBPR or nosebleeds.   LDH stable at not drawn today with established baseline of 150 - 220. Denies tea-colored urine. No power elevations noted on interrogation.   Patient Instructions: 1. Increase Amlodipine to 10mg  daily  2. Coumadin dosing per Ander Purpura PharmD 3. Return to [place] clinic in 2 weeks for labs, BP check, and dressing change   Emerson Monte RN [place] Coordinator  Office: [phone]  24/7 Pager: [phone]

## 2019-09-29 NOTE — Progress Notes (Signed)
LVAD INR 

## 2019-09-29 NOTE — Addendum Note (Signed)
Encounter addended by: Mertha Baars, RN on: 09/29/2019 1:05 PM  Actions taken: Clinical Note Signed

## 2019-10-01 ENCOUNTER — Other Ambulatory Visit (HOSPITAL_COMMUNITY): Payer: Self-pay | Admitting: *Deleted

## 2019-10-01 DIAGNOSIS — Z95811 Presence of heart assist device: Secondary | ICD-10-CM

## 2019-10-01 DIAGNOSIS — Z7901 Long term (current) use of anticoagulants: Secondary | ICD-10-CM

## 2019-10-04 ENCOUNTER — Other Ambulatory Visit (HOSPITAL_COMMUNITY): Payer: No Typology Code available for payment source

## 2019-10-08 ENCOUNTER — Other Ambulatory Visit (HOSPITAL_COMMUNITY): Payer: Self-pay | Admitting: *Deleted

## 2019-10-08 DIAGNOSIS — Z7901 Long term (current) use of anticoagulants: Secondary | ICD-10-CM

## 2019-10-08 DIAGNOSIS — Z95811 Presence of heart assist device: Secondary | ICD-10-CM

## 2019-10-13 ENCOUNTER — Ambulatory Visit (HOSPITAL_COMMUNITY)
Admission: RE | Admit: 2019-10-13 | Discharge: 2019-10-13 | Disposition: A | Discharge status: Home / Self Care | Payer: No Typology Code available for payment source | Source: Ambulatory Visit | Attending: Internal Medicine | Admitting: Internal Medicine

## 2019-10-13 ENCOUNTER — Other Ambulatory Visit: Payer: Self-pay

## 2019-10-13 ENCOUNTER — Ambulatory Visit (HOSPITAL_COMMUNITY): Payer: Self-pay | Admitting: Pharmacist

## 2019-10-13 DIAGNOSIS — Z7901 Long term (current) use of anticoagulants: Secondary | ICD-10-CM | POA: Insufficient documentation

## 2019-10-13 DIAGNOSIS — Z95811 Presence of heart assist device: Secondary | ICD-10-CM

## 2019-10-13 DIAGNOSIS — Z4801 Encounter for change or removal of surgical wound dressing: Secondary | ICD-10-CM | POA: Diagnosis present

## 2019-10-13 LAB — PROTIME-INR
INR: 2.2 — ABNORMAL HIGH (ref 0.8–1.2)
Prothrombin Time: 24 seconds — ABNORMAL HIGH (ref 11.4–15.2)

## 2019-10-13 LAB — BASIC METABOLIC PANEL
Anion gap: 12 (ref 5–15)
BUN: 14 mg/dL (ref 6–20)
CO2: 18 mmol/L — ABNORMAL LOW (ref 22–32)
Calcium: 9.1 mg/dL (ref 8.9–10.3)
Chloride: 106 mmol/L (ref 98–111)
Creatinine, Ser: 1.19 mg/dL (ref 0.61–1.24)
GFR calc Af Amer: >60 mL/min (ref >60)
GFR calc non Af Amer: >60 mL/min (ref >60)
Glucose, Bld: 109 mg/dL — ABNORMAL HIGH (ref 70–99)
Potassium: 4 mmol/L (ref 3.5–5.1)
Sodium: 136 mmol/L (ref 135–145)

## 2019-10-13 NOTE — Progress Notes (Signed)
Patient presents for BP check and dressing change in VAD Clinic today with [facility]. Reports no problems with VAD equipment or concerns with drive line. Denies any falls, dizziness, lightheadedness, or syncope.   His wife has continued daily dressing changes and he denies any issues with VAD drive line exit site. [name] reports intermittent yellow drainage.   Vital Signs:  Doppler Pressure: 98 Automatc BP:  114/61 (82) HR: 89 SPO2: 99% RA  Weight: 192 lb w/o eqt Last weight: 191.4 lb  VAD Indication: Destination Therapy due to smoking status  Exit Site Care: Drive line is being maintained daily by [employer]. Gauze dressing with anchor intact and accurately applied.  Existing dressing removed using sterile technique. Pt has scant amount of yellow drainage on the silver strips with no foul odor, rash, or tenderness noted. Exit site with complete tissue ingrowth, no tunneling noted. Pt provided with 20 daily dressing kits, betadine swabs, adhesive remover pads and sterile saline packs. Instructed to continue daily dressing changes and to notify VAD team if drainage increases or has foul odor.       Device: N/A   BP & Labs:  Doppler 88 - Doppler is reflecting MAP   Hgb not drawn today  - No S/S of bleeding. Specifically denies melena/BRBPR or nosebleeds.   LDH stable at not drawn today with established baseline of 150 - 220. Denies tea-colored urine. No power elevations noted on interrogation.   Patient Instructions: 1. Continue current medications. 3. Return to [place] clinic in 1 month for full visit w/Dr. [name].  Tanda Rockers RN [place] Coordinator  Office: [phone]  24/7 Pager: [phone]

## 2019-10-13 NOTE — Addendum Note (Signed)
Encounter addended by: [name], RN on: 10/13/2019 10:33 AM  Actions taken: Clinical Note Signed

## 2019-10-13 NOTE — Progress Notes (Signed)
LVAD INR 

## 2019-10-22 ENCOUNTER — Other Ambulatory Visit (HOSPITAL_COMMUNITY): Payer: Self-pay | Admitting: Unknown Physician Specialty

## 2019-10-22 DIAGNOSIS — Z95811 Presence of heart assist device: Secondary | ICD-10-CM

## 2019-10-22 DIAGNOSIS — Z7901 Long term (current) use of anticoagulants: Secondary | ICD-10-CM

## 2019-10-27 ENCOUNTER — Ambulatory Visit (HOSPITAL_COMMUNITY)
Admission: RE | Admit: 2019-10-27 | Discharge: 2019-10-27 | Disposition: A | Discharge status: Home / Self Care | Payer: No Typology Code available for payment source | Source: Ambulatory Visit | Attending: Cardiology | Admitting: Cardiology

## 2019-10-27 ENCOUNTER — Ambulatory Visit (HOSPITAL_COMMUNITY): Payer: Self-pay | Admitting: Pharmacist

## 2019-10-27 ENCOUNTER — Other Ambulatory Visit: Payer: Self-pay

## 2019-10-27 DIAGNOSIS — Z7901 Long term (current) use of anticoagulants: Secondary | ICD-10-CM | POA: Insufficient documentation

## 2019-10-27 DIAGNOSIS — Z95811 Presence of heart assist device: Secondary | ICD-10-CM

## 2019-10-27 LAB — PROTIME-INR
INR: 2.2 — ABNORMAL HIGH (ref 0.8–1.2)
Prothrombin Time: 24.5 seconds — ABNORMAL HIGH (ref 11.4–15.2)

## 2019-10-27 NOTE — Progress Notes (Signed)
LVAD INR 

## 2019-11-16 ENCOUNTER — Other Ambulatory Visit (HOSPITAL_COMMUNITY): Payer: Self-pay | Admitting: *Deleted

## 2019-11-16 DIAGNOSIS — I5022 Chronic systolic (congestive) heart failure: Secondary | ICD-10-CM

## 2019-11-16 DIAGNOSIS — Z95811 Presence of heart assist device: Secondary | ICD-10-CM

## 2019-11-16 DIAGNOSIS — Z7901 Long term (current) use of anticoagulants: Secondary | ICD-10-CM

## 2019-11-17 ENCOUNTER — Other Ambulatory Visit (HOSPITAL_COMMUNITY): Payer: Self-pay | Admitting: *Deleted

## 2019-11-17 ENCOUNTER — Other Ambulatory Visit: Payer: Self-pay

## 2019-11-17 ENCOUNTER — Ambulatory Visit (HOSPITAL_COMMUNITY)
Admission: RE | Admit: 2019-11-17 | Discharge: 2019-11-17 | Disposition: A | Discharge status: Home / Self Care | Payer: No Typology Code available for payment source | Source: Ambulatory Visit | Attending: Cardiology | Admitting: Cardiology

## 2019-11-17 ENCOUNTER — Ambulatory Visit (HOSPITAL_COMMUNITY): Payer: Self-pay | Admitting: Pharmacist

## 2019-11-17 ENCOUNTER — Encounter (HOSPITAL_COMMUNITY): Payer: Self-pay

## 2019-11-17 VITALS — BP 100/68 | HR 94 | Temp 98.5°F | Wt 187.4 lb

## 2019-11-17 DIAGNOSIS — K219 Gastro-esophageal reflux disease without esophagitis: Secondary | ICD-10-CM | POA: Insufficient documentation

## 2019-11-17 DIAGNOSIS — I110 Hypertensive heart disease with heart failure: Secondary | ICD-10-CM | POA: Diagnosis not present

## 2019-11-17 DIAGNOSIS — I82401 Acute embolism and thrombosis of unspecified deep veins of right lower extremity: Secondary | ICD-10-CM | POA: Insufficient documentation

## 2019-11-17 DIAGNOSIS — I428 Other cardiomyopathies: Secondary | ICD-10-CM | POA: Diagnosis not present

## 2019-11-17 DIAGNOSIS — Z95811 Presence of heart assist device: Secondary | ICD-10-CM | POA: Diagnosis not present

## 2019-11-17 DIAGNOSIS — Z720 Tobacco use: Secondary | ICD-10-CM | POA: Diagnosis not present

## 2019-11-17 DIAGNOSIS — I5022 Chronic systolic (congestive) heart failure: Secondary | ICD-10-CM

## 2019-11-17 DIAGNOSIS — Z7901 Long term (current) use of anticoagulants: Secondary | ICD-10-CM | POA: Diagnosis not present

## 2019-11-17 DIAGNOSIS — F17210 Nicotine dependence, cigarettes, uncomplicated: Secondary | ICD-10-CM | POA: Diagnosis not present

## 2019-11-17 DIAGNOSIS — Z79899 Other long term (current) drug therapy: Secondary | ICD-10-CM | POA: Diagnosis not present

## 2019-11-17 DIAGNOSIS — Z7982 Long term (current) use of aspirin: Secondary | ICD-10-CM | POA: Diagnosis not present

## 2019-11-17 DIAGNOSIS — E785 Hyperlipidemia, unspecified: Secondary | ICD-10-CM | POA: Insufficient documentation

## 2019-11-17 DIAGNOSIS — E118 Type 2 diabetes mellitus with unspecified complications: Secondary | ICD-10-CM | POA: Insufficient documentation

## 2019-11-17 DIAGNOSIS — K7460 Unspecified cirrhosis of liver: Secondary | ICD-10-CM | POA: Insufficient documentation

## 2019-11-17 DIAGNOSIS — G4733 Obstructive sleep apnea (adult) (pediatric): Secondary | ICD-10-CM | POA: Diagnosis not present

## 2019-11-17 DIAGNOSIS — Z7984 Long term (current) use of oral hypoglycemic drugs: Secondary | ICD-10-CM | POA: Insufficient documentation

## 2019-11-17 DIAGNOSIS — A4901 Methicillin susceptible Staphylococcus aureus infection, unspecified site: Secondary | ICD-10-CM | POA: Insufficient documentation

## 2019-11-17 LAB — CBC
HCT: 46 % (ref 39.0–52.0)
Hemoglobin: 15.6 g/dL (ref 13.0–17.0)
MCH: 30.4 pg (ref 26.0–34.0)
MCHC: 33.9 g/dL (ref 30.0–36.0)
MCV: 89.7 fL (ref 80.0–100.0)
Platelets: 176 10*3/uL (ref 150–400)
RBC: 5.13 MIL/uL (ref 4.22–5.81)
RDW: 14.1 % (ref 11.5–15.5)
WBC: 9 10*3/uL (ref 4.0–10.5)
nRBC: 0 % (ref 0.0–0.2)

## 2019-11-17 LAB — LACTATE DEHYDROGENASE: LDH: 250 U/L — ABNORMAL HIGH (ref 98–192)

## 2019-11-17 LAB — BASIC METABOLIC PANEL
Anion gap: 13 (ref 5–15)
BUN: 10 mg/dL (ref 6–20)
CO2: 21 mmol/L — ABNORMAL LOW (ref 22–32)
Calcium: 9.1 mg/dL (ref 8.9–10.3)
Chloride: 108 mmol/L (ref 98–111)
Creatinine, Ser: 1.02 mg/dL (ref 0.61–1.24)
GFR calc Af Amer: >60 mL/min (ref >60)
GFR calc non Af Amer: >60 mL/min (ref >60)
Glucose, Bld: 105 mg/dL — ABNORMAL HIGH (ref 70–99)
Potassium: 3.7 mmol/L (ref 3.5–5.1)
Sodium: 142 mmol/L (ref 135–145)

## 2019-11-17 LAB — PROTIME-INR
INR: 2.1 — ABNORMAL HIGH (ref 0.8–1.2)
Prothrombin Time: 23.4 seconds — ABNORMAL HIGH (ref 11.4–15.2)

## 2019-11-17 MED ORDER — CHANTIX STARTING MONTH PAK 0.5 MG X 11 & 1 MG X 42 PO TABS: ORAL_TABLET | ORAL | 0 refills | Status: DC

## 2019-11-17 NOTE — Progress Notes (Addendum)
Patient presents for 2 mo f/u in VAD Clinic today with his wife. Reports no problems with VAD equipment or concerns with drive line. Denies any falls, dizziness, lightheadedness, or syncope.   Pt's BP improved today. He has been taking increased doses of Amlodipine and Entresto. 100 PI events noted on interrogation, but few are true suction. He denies dizziness and lightheadedness. Reports drinking 2L per day.   His wife has continued daily dressing changes and he denies any issues with VAD drive line exit site.  Reports that he plans to quit smoking January 1st. Sent in prescription for Chantix to [facility] pharmacy per Dr Shirlee Latch.   Vital Signs:  Temp:  98.5 Doppler Pressure: 86 Automatc BP: 100/68 (84) HR: 94 SPO2: 99% RA  Weight: 187.4 lb w/o eqt Last weight: 192 lb  VAD Indication: Destination Therapy due to smoking status  VAD interrogation & Equipment Management: Speed: 5700 Flow:  4.5 Power:  4.5w    PI: 4.6 Hct: 38  Alarms: no clinical alarms Events:  100 PI events daily Fixed speed: 5700 Low speed limit: 5400  Primary Controller:  Replace back up battery in 16 months. Back up controller:   Replace back up battery in 29 months.  Annual Equipment Maintenance on UBC/PM was performed on 07/2019.   Back up battery charged in back up controller during clinic visit.   I reviewed the LVAD parameters from today and compared the results to the patient's prior recorded data. LVAD interrogation was NEGATIVE for significant power changes, NEGATIVE for clinical alarms and NEGATIVE for PI events/speed drops. No programming changes were made and pump is functioning within specified parameters. Pt is performing daily controller and system monitor self tests along with completing weekly and monthly maintenance for LVAD equipment.  LVAD equipment check completed and is in good working order. Back-up equipment present.   Exit Site Care: Drive line is being maintained daily by  [name]. Gauze dressing with anchor intact and accurately applied.  Existing dressing removed using sterile technique. Site cleansed with betadine swab x 2 and allowed to dry. Covered site with gauze dressing. Pt has small amount of greenish yellow drainage on the gauze with no foul odor, rash, or tenderness noted. Hypergranulation tissues notes. Exit site with complete tissue ingrowth. Tunnels 1 cm along outer portion of exit site. Pt provided with 14 daily dressing kits. Instructed to continue daily dressing changes and to notify VAD team if drainage increases or has foul odor or fever.       Device: N/A   BP & Labs:  Doppler 86 - Doppler is reflecting MAP   Hgb 15.6 - No S/S of bleeding. Specifically denies melena/BRBPR or nosebleeds.   LDH stable at 250 with established baseline of 150 - 220. Denies tea-colored urine. No power elevations noted on interrogation.   Patient Instructions: 1. Start Chantix for smoking cessation 2. Coumadin dosing per [name] PharmD 3. Continue daily dressing changes. Call for increased drainage, smell, or fever 4. Return to VAD clinic in 2 months. Scheduled for an ECHO the same day. (Echo at 0900, appointment at 1000)  Alyce Pagan RN VAD Coordinator  Office: [phone]  24/7 Pager: [phone]   58 y.o. with history of nonischemic cardiomyopathy, RLE DVT, cirrhosis, smoking, and OSA returns for followup of CHF/LVAD placement.  Cardiomyopathy was diagnosed in 6/19 in [place], [place] at that time showed low output.  He was admitted to [facility] in 9/19 with low output HF and was started on milrinone and diuresed.  Unable to wean off milrinone.  He had a degree of RV failure, but this improved on milrinone.  Valvular heart disease also looked better with milrinone and diuresis.  On 08/03/18, he had Heartmate 3 LVAD placed.  Speed was optimized by ramp echo post-op.  Post-op course was relatively unremarkable.  He was admitted in 1/20 with MSSA driveline  infection.    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  No abscess on CT.  He was started on cefazolin IV for 6 wks.  BP-active meds decreased with low MAP.   He is now on chronic Keflex for MSSA suppression. Still mild driveline site drainage. Symptomatically doing well.  No significant dyspnea with ADLs and walking on flat ground.  MAP is controlled.  Still with frequent PI events, have been chronic.  Plans to stop smoking with Chantix on 11/19/19.   Denies LVAD alarms.  Denies driveline trauma, erythema or drainage.  Denies ICD shocks.   Reports taking Coumadin as prescribed and adherence to anticoagulation based dietary restrictions.  Denies bright red blood per rectum or melena, no dark urine or hematuria.    Labs (9/19): LDH 210, INR 2.17, WBCs 17.1 => 15, hgb 9.7, creatinine 0.72 Labs (10/19): K 4.3, creatinine 0.78 => 0.83, hgb 10.2 Labs (11/19): creatinine 0.79, hgb 10 Labs (2/20): K 3.9, creatinine 0.79 Labs (4/20): K 4, creatinine 1.15, hgb 11.4, LDH 199, INR 1.9 Labs (5/20): hgb 10.4, WBCs 8.5 Labs (6/20): K 4.2, creatinine 1.06, hgb 11.2 Labs (8/20): k 3.7, creatinine 1.0, hgb 11.5, LDH 186 Labs (10/20): K 3.5, creatinine 0.93, INR 2, LDH 221 Labs (12/20): hgb 15.6, K 3.7, creatinine 1.02, LDH 250  PMH: 1. Degenerative disc disease.  2. GERD 3. Chronic systolic CHF:  Nonischemic cardiomyopathy.  Dilated cardiomyopathy diagnosed 6/19 in [place].  LHC/RHC in 7/19 showed elevated filling pressures, low cardiac output, and no significant CAD.   - Echo (9/19): Severe LV dilation with EF 10-20%, moderate-severe MR, severely dilated RV with mildly decreased systolic function, severe TR.  - Cardiac MRI (9/19): EF 14%, moderate dilated LV, severely dilated RV with mod-severe systolic function and EF 61%, nonspecific RV insertion site LGE.  - RHC (5/19) on milrinone 0.375: mean RA 8, PA 40/19, mean PCWP 12, PAPi 2.65, CI 2.71.  - Echo (9/19) on milrinone and  diuresed): EF 20-25% with moderate LV dilation, moderately dilated RV with mildly decreased systolic function, mild-moderate MR, moderate TR, PASP 51 mmHg.  Cannot rule out noncompaction.  - Heartmate 3 LVAD placement in 9/19.  4. OSA: CPAP use.  5. Prior smoker 6. Type 2 diabetes 7. Hyperlipidemia.  8. H/o NSVT 9. 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  10. RLE DVT: found in 9/19.  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   Current Outpatient Medications  Medication Sig Dispense Refill  . amLODipine (NORVASC) 5 MG tablet Take 2 tablets (10 mg total) by mouth daily. 180 tablet 3  . aspirin EC 81 MG tablet Take 81 mg by mouth daily.    Marland Kitchen atorvastatin (LIPITOR) 20 MG tablet Take 1 tablet (20 mg total) by mouth daily. 90 tablet 3  . carbamide peroxide (DEBROX) 6.5 % OTIC solution Place 5 drops into both ears 3 (three) times daily. 15 mL 0  . cephALEXin (KEFLEX) 500 MG capsule Take 1 capsule (500 mg total) by mouth 3 (three) times daily. 90 capsule 11  . cyanocobalamin 500 MCG tablet Take 1,000 mcg by  mouth daily.     Marland Kitchen docusate sodium (COLACE) 100 MG capsule Take 2 capsules (200 mg total) by mouth daily as needed for mild constipation. 10 capsule 0  . DULoxetine (CYMBALTA) 60 MG capsule Take 60 mg by mouth daily.    . fluticasone (FLONASE) 50 MCG/ACT nasal spray Place 1 spray into both nostrils 2 (two) times daily as needed for allergies.     Marland Kitchen gabapentin (NEURONTIN) 300 MG capsule Take 600 mg by mouth 3 (three) times daily.    Marland Kitchen glucose blood (CONTOUR NEXT TEST) test strip Use as instructed 100 each 12  . hydrOXYzine (VISTARIL) 25 MG capsule Take 25 mg by mouth 3 (three) times daily as needed for anxiety.    . Ipratropium-Albuterol (COMBIVENT) 20-100 MCG/ACT AERS respimat Inhale 1 puff into the lungs every 6 (six) hours.    . Lancets (FREESTYLE) lancets Use as instructed 100 each 12  . metFORMIN (GLUCOPHAGE-XR) 500 MG 24 hr tablet  Take 500 mg by mouth 2 (two) times daily.    . methocarbamol (ROBAXIN) 750 MG tablet Take 750 mg by mouth 3 (three) times daily as needed for muscle spasms.     Marland Kitchen omeprazole (PRILOSEC) 20 MG capsule Take 20 mg by mouth daily.    . polyethylene glycol (MIRALAX / GLYCOLAX) packet Take 17 g by mouth daily as needed for mild constipation.    . sacubitril-valsartan (ENTRESTO) 97-103 MG Take 1 tablet by mouth 2 (two) times daily. 60 tablet 6  . sildenafil (VIAGRA) 100 MG tablet Take 50 mg by mouth daily as needed for erectile dysfunction.    Marland Kitchen spironolactone (ALDACTONE) 25 MG tablet Take 1 tablet (25 mg total) by mouth daily. 30 tablet 6  . thiamine 100 MG tablet Take 1 tablet (100 mg total) by mouth daily. 30 tablet 0  . traMADol (ULTRAM) 50 MG tablet Take 1 tablet (50 mg total) by mouth every 4 (four) hours as needed for moderate pain. 30 tablet 0  . traMADol (ULTRAM) 50 MG tablet Take 1 tablet (50 mg total) by mouth every 6 (six) hours as needed for severe pain. 15 tablet 0  . warfarin (COUMADIN) 5 MG tablet Take 5-7.5 mg by mouth See admin instructions. Take  1 and 1/2 tablets (7.5 mg) Tue, Thur and Sat Take 1 tablet by mouth all other days Or as directed by [facility] clinic    . Zinc Gluconate 100 MG TABS Take 1 tablet (100 mg total) by mouth daily. (Patient taking differently: Take 100 mg by mouth 2 (two) times a day. ) 90 tablet 3  . zolpidem (AMBIEN) 5 MG tablet Take 1 tablet (5 mg total) by mouth at bedtime. 30 tablet 3  . enoxaparin (LOVENOX) 40 MG/0.4ML injection Inject 0.4 mLs (40 mg total) into the skin daily. (Patient not taking: Reported on 09/15/2019) 4 mL 1  . nicotine (NICODERM CQ - DOSED IN MG/24 HR) 7 mg/24hr patch Place 1 patch (7 mg total) onto the skin daily as needed (tobacco craving). (Patient not taking: Reported on 09/15/2019) 28 patch 0  . nystatin (MYCOSTATIN/NYSTOP) powder Use with VAD dressing changes daily, advance as directed. (Patient not taking: Reported on 09/29/2019) 15 g 3    . varenicline (CHANTIX STARTING MONTH PAK) 0.5 MG X 11 & 1 MG X 42 tablet Take one 0.5 mg tablet by mouth once daily for 3 days, then increase to one 0.5 mg tablet twice daily for 4 days, then increase to one 1 mg tablet twice daily. 53  tablet 0   No current facility-administered medications for this encounter.    Chlorhexidine  REVIEW OF SYSTEMS: All systems negative except as listed in HPI, PMH and Problem list.   LVAD INTERROGATION:  Please see LVAD nurse's note above for details.    I reviewed the LVAD parameters from today, and compared the results to the patient's prior recorded data.  No programming changes were made.  The LVAD is functioning within specified parameters.  The patient performs LVAD self-test daily.  LVAD interrogation was negative for any significant power changes, alarms or PI events/speed drops.  LVAD equipment check completed and is in good working order.  Back-up equipment present.   LVAD education done on emergency procedures and precautions and reviewed exit site care.    MAP 84  Physical Exam: General: Well appearing this am. NAD.  HEENT: Normal. Neck: Supple, JVP 7-8 cm. Carotids OK.  Cardiac:  Mechanical heart sounds with LVAD hum present.  Lungs:  CTAB, normal effort.  Abdomen:  NT, ND, no HSM. No bruits or masses. +BS  LVAD exit site: Well-healed and incorporated. Dressing dry and intact. Mild drainage. Stabilization device present and accurately applied. Driveline dressing changed daily per sterile technique. Extremities:  Warm and dry. No cyanosis, clubbing, rash, or edema.  Neuro:  Alert & oriented x 3. Cranial nerves grossly intact. Moves all 4 extremities w/o difficulty. Affect pleasant       ASSESSMENT AND PLAN:   1. 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  LVAD parameters stable but still with multiple PIs, no suction.  Frequent PIs may be related to systemic hypertension.  NYHA class I-II.   He  does not look volume overloaded. MAP controlled on current regimen.  Still with frequent PI events (chronic).  - He does not need Lasix.  Encouraged hydration.    - Continue spironolactone 25 mg daily.  - Continue Entresto to 97/103 bid.  - Continue amlodipine 5 mg daily.  - Continue ASA 81 daily.  - Continue warfarin for INR 2-2.5.  No evidence for GI bleeding. Hgb today is stable.  - Should be transplant candidate eventually if he can quit smoking.  Will make transplant clinic referral when he is off totally.  - Echo at next clinic appt in 2 months.  2. Smoking: Plans to quit with Chantix on 11/19/19.  3. RLE DVT: On warfarin for LVAD.  4. OSA: Continue CPAP.  5. Hyperlipidemia: Atorvastatin.  6. Type II diabetes: Metformin, per PCP.  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 Driveline stable.    Marca AnconaDalton Lakeithia Welch 11/19/2019

## 2019-11-17 NOTE — Patient Instructions (Signed)
1. Start Chantix for smoking cessation 2. Coumadin dosing per Ander Purpura PharmD 3. Continue daily dressing changes. Call for increased drainage, smell, or fever 4. Return to [place] clinic in 2 months. Scheduled for an ECHO the same day. (Echo at 0900, appointment at 76)

## 2019-11-17 NOTE — Progress Notes (Signed)
LVAD INR 

## 2019-11-23 ENCOUNTER — Telehealth (HOSPITAL_COMMUNITY): Payer: Self-pay | Admitting: Unknown Physician Specialty

## 2019-11-23 DIAGNOSIS — Z95811 Presence of heart assist device: Secondary | ICD-10-CM

## 2019-11-23 DIAGNOSIS — Z7901 Long term (current) use of anticoagulants: Secondary | ICD-10-CM

## 2019-11-23 NOTE — Telephone Encounter (Signed)
Pt called stating that he needs a "breathing test" to obtain his license. Pt states that he he complete the process to have his smart starter installed on his car due to his history of DUI but that he did not perform well on the breathing portion and was told he needed breathing test. Pt was informed that due to the rise of COVID pts in our health system we have suspended all PFTs until February. Pt was informed to reach back out at that time and we can see if they are testing again.  [name] RN, BSN VAD Coordinator 24/7 Pager [phone]

## 2019-11-25 ENCOUNTER — Other Ambulatory Visit (HOSPITAL_COMMUNITY): Payer: Self-pay | Admitting: *Deleted

## 2019-11-25 DIAGNOSIS — Z7901 Long term (current) use of anticoagulants: Secondary | ICD-10-CM

## 2019-11-25 DIAGNOSIS — Z95811 Presence of heart assist device: Secondary | ICD-10-CM

## 2019-12-01 ENCOUNTER — Ambulatory Visit (HOSPITAL_COMMUNITY): Payer: Self-pay | Admitting: Pharmacist

## 2019-12-01 ENCOUNTER — Other Ambulatory Visit: Payer: Self-pay

## 2019-12-01 ENCOUNTER — Ambulatory Visit (HOSPITAL_COMMUNITY)
Admission: RE | Admit: 2019-12-01 | Discharge: 2019-12-01 | Disposition: A | Discharge status: Home / Self Care | Payer: No Typology Code available for payment source | Source: Ambulatory Visit | Attending: Internal Medicine | Admitting: Internal Medicine

## 2019-12-01 DIAGNOSIS — Z95811 Presence of heart assist device: Secondary | ICD-10-CM

## 2019-12-01 DIAGNOSIS — Z7901 Long term (current) use of anticoagulants: Secondary | ICD-10-CM | POA: Diagnosis present

## 2019-12-01 LAB — PROTIME-INR
INR: 1.6 — ABNORMAL HIGH (ref 0.8–1.2)
Prothrombin Time: 18.7 seconds — ABNORMAL HIGH (ref 11.4–15.2)

## 2019-12-01 NOTE — Addendum Note (Signed)
Encounter addended by: Levonne Spiller, RN on: 12/01/2019 3:17 PM  Actions taken: Clinical Note Signed

## 2019-12-01 NOTE — Progress Notes (Signed)
Pt here for INR check.  Pt and wife provided with 14 daily dressing kits and 10 anchors for home use.  Hessie Diener RN, VAD Coordinator [phone]

## 2019-12-01 NOTE — Progress Notes (Signed)
LVAD INR 

## 2019-12-08 ENCOUNTER — Other Ambulatory Visit (HOSPITAL_COMMUNITY): Payer: Self-pay | Admitting: *Deleted

## 2019-12-08 DIAGNOSIS — B369 Superficial mycosis, unspecified: Secondary | ICD-10-CM

## 2019-12-08 MED ORDER — NYSTATIN 100000 UNIT/GM EX POWD: CUTANEOUS | 3 refills | Status: DC

## 2019-12-10 ENCOUNTER — Other Ambulatory Visit (HOSPITAL_COMMUNITY): Payer: Self-pay | Admitting: *Deleted

## 2019-12-10 DIAGNOSIS — B369 Superficial mycosis, unspecified: Secondary | ICD-10-CM

## 2019-12-10 DIAGNOSIS — Z7901 Long term (current) use of anticoagulants: Secondary | ICD-10-CM

## 2019-12-10 DIAGNOSIS — Z95811 Presence of heart assist device: Secondary | ICD-10-CM

## 2019-12-10 MED ORDER — NYSTATIN 100000 UNIT/GM EX POWD: CUTANEOUS | 3 refills | Status: DC

## 2019-12-13 ENCOUNTER — Other Ambulatory Visit (HOSPITAL_COMMUNITY): Payer: Self-pay | Admitting: *Deleted

## 2019-12-13 DIAGNOSIS — B350 Tinea barbae and tinea capitis: Secondary | ICD-10-CM

## 2019-12-13 DIAGNOSIS — B369 Superficial mycosis, unspecified: Secondary | ICD-10-CM

## 2019-12-13 MED ORDER — NYSTATIN 100000 UNIT/GM EX POWD: 1.0000 "application " | Freq: Three times a day (TID) | CUTANEOUS | 3 refills | Status: DC

## 2019-12-14 ENCOUNTER — Other Ambulatory Visit (HOSPITAL_COMMUNITY): Payer: Self-pay | Admitting: *Deleted

## 2019-12-14 NOTE — Progress Notes (Signed)
Submitted prior authorization for Nystatin powder via [employer] Portal.   Spoke with [facility]- cost out of pocket for patient without prior authorization approval is $9.50 with use of a GoodRx card.   Called and left voicemail message for patient explaining all the above.   Alyce Pagan RN VAD Coordinator  Office: [phone]  24/7 Pager: [phone]

## 2019-12-15 ENCOUNTER — Other Ambulatory Visit: Payer: Self-pay

## 2019-12-15 ENCOUNTER — Ambulatory Visit (HOSPITAL_COMMUNITY): Payer: Self-pay | Admitting: Pharmacist

## 2019-12-15 ENCOUNTER — Ambulatory Visit (HOSPITAL_COMMUNITY)
Admission: RE | Admit: 2019-12-15 | Discharge: 2019-12-15 | Disposition: A | Discharge status: Home / Self Care | Payer: No Typology Code available for payment source | Source: Ambulatory Visit | Attending: Internal Medicine | Admitting: Internal Medicine

## 2019-12-15 DIAGNOSIS — Z95811 Presence of heart assist device: Secondary | ICD-10-CM

## 2019-12-15 DIAGNOSIS — Z7901 Long term (current) use of anticoagulants: Secondary | ICD-10-CM

## 2019-12-15 LAB — PROTIME-INR
INR: 2.8 — ABNORMAL HIGH (ref 0.8–1.2)
Prothrombin Time: 29.2 seconds — ABNORMAL HIGH (ref 11.4–15.2)

## 2019-12-15 NOTE — Progress Notes (Signed)
LVAD INR 

## 2019-12-18 ENCOUNTER — Other Ambulatory Visit (HOSPITAL_COMMUNITY): Payer: Self-pay | Admitting: Cardiology

## 2019-12-24 ENCOUNTER — Other Ambulatory Visit (HOSPITAL_COMMUNITY): Payer: Self-pay | Admitting: Unknown Physician Specialty

## 2019-12-24 DIAGNOSIS — Z7901 Long term (current) use of anticoagulants: Secondary | ICD-10-CM

## 2019-12-24 DIAGNOSIS — Z95811 Presence of heart assist device: Secondary | ICD-10-CM

## 2019-12-29 ENCOUNTER — Telehealth (HOSPITAL_COMMUNITY): Payer: Self-pay | Admitting: *Deleted

## 2019-12-29 ENCOUNTER — Other Ambulatory Visit: Payer: Self-pay

## 2019-12-29 ENCOUNTER — Ambulatory Visit (HOSPITAL_COMMUNITY)
Admission: RE | Admit: 2019-12-29 | Discharge: 2019-12-29 | Disposition: A | Discharge status: Home / Self Care | Payer: No Typology Code available for payment source | Source: Ambulatory Visit | Attending: Internal Medicine | Admitting: Internal Medicine

## 2019-12-29 ENCOUNTER — Other Ambulatory Visit (HOSPITAL_COMMUNITY): Payer: Self-pay | Admitting: *Deleted

## 2019-12-29 ENCOUNTER — Ambulatory Visit (HOSPITAL_COMMUNITY): Payer: Self-pay | Admitting: Pharmacist

## 2019-12-29 DIAGNOSIS — Z95811 Presence of heart assist device: Secondary | ICD-10-CM | POA: Diagnosis not present

## 2019-12-29 DIAGNOSIS — Z7901 Long term (current) use of anticoagulants: Secondary | ICD-10-CM | POA: Insufficient documentation

## 2019-12-29 LAB — PROTIME-INR
INR: 1.6 — ABNORMAL HIGH (ref 0.8–1.2)
Prothrombin Time: 18.7 seconds — ABNORMAL HIGH (ref 11.4–15.2)

## 2019-12-29 NOTE — Telephone Encounter (Signed)
Left voicemail regarding appt time change: Echo scheduled 01/26/20 at 0800 and follow up appointment scheduled 01/26/20 at 0900. Requested call back to confirm he received message.  Alyce Pagan RN VAD Coordinator  Office: [phone]  24/7 Pager: [phone]

## 2019-12-29 NOTE — Progress Notes (Signed)
LVAD INR 

## 2020-01-07 ENCOUNTER — Other Ambulatory Visit (HOSPITAL_COMMUNITY): Payer: Self-pay | Admitting: *Deleted

## 2020-01-07 DIAGNOSIS — Z7901 Long term (current) use of anticoagulants: Secondary | ICD-10-CM

## 2020-01-07 DIAGNOSIS — Z95811 Presence of heart assist device: Secondary | ICD-10-CM

## 2020-01-12 ENCOUNTER — Other Ambulatory Visit: Payer: Self-pay

## 2020-01-12 ENCOUNTER — Ambulatory Visit (HOSPITAL_COMMUNITY)
Admission: RE | Admit: 2020-01-12 | Discharge: 2020-01-12 | Disposition: A | Discharge status: Home / Self Care | Payer: No Typology Code available for payment source | Source: Ambulatory Visit | Attending: Internal Medicine | Admitting: Internal Medicine

## 2020-01-12 ENCOUNTER — Ambulatory Visit (HOSPITAL_COMMUNITY): Payer: Self-pay | Admitting: Pharmacist

## 2020-01-12 DIAGNOSIS — Z95811 Presence of heart assist device: Secondary | ICD-10-CM

## 2020-01-12 DIAGNOSIS — Z7901 Long term (current) use of anticoagulants: Secondary | ICD-10-CM

## 2020-01-12 DIAGNOSIS — Z5181 Encounter for therapeutic drug level monitoring: Secondary | ICD-10-CM | POA: Insufficient documentation

## 2020-01-12 LAB — PROTIME-INR
INR: 2.5 — ABNORMAL HIGH (ref 0.8–1.2)
Prothrombin Time: 26.6 seconds — ABNORMAL HIGH (ref 11.4–15.2)

## 2020-01-12 NOTE — Progress Notes (Signed)
LVAD INR 

## 2020-01-17 DIAGNOSIS — L0291 Cutaneous abscess, unspecified: Secondary | ICD-10-CM

## 2020-01-17 HISTORY — DX: Cutaneous abscess, unspecified: L02.91

## 2020-01-19 ENCOUNTER — Encounter (HOSPITAL_COMMUNITY): Payer: No Typology Code available for payment source

## 2020-01-19 ENCOUNTER — Other Ambulatory Visit (HOSPITAL_COMMUNITY): Payer: No Typology Code available for payment source

## 2020-01-25 ENCOUNTER — Inpatient Hospital Stay (HOSPITAL_COMMUNITY): Payer: No Typology Code available for payment source

## 2020-01-25 ENCOUNTER — Other Ambulatory Visit: Payer: Self-pay

## 2020-01-25 ENCOUNTER — Inpatient Hospital Stay (HOSPITAL_COMMUNITY)
Admission: AD | Admit: 2020-01-25 | Discharge: 2020-02-09 | DRG: 264 | Disposition: A | Discharge status: Home Health | Payer: No Typology Code available for payment source | Source: Ambulatory Visit | Attending: Cardiology | Admitting: Cardiology

## 2020-01-25 ENCOUNTER — Ambulatory Visit (HOSPITAL_COMMUNITY)
Admission: RE | Admit: 2020-01-25 | Discharge: 2020-01-25 | Disposition: A | Discharge status: Home / Self Care | Payer: No Typology Code available for payment source | Source: Ambulatory Visit | Attending: Cardiology | Admitting: Cardiology

## 2020-01-25 ENCOUNTER — Other Ambulatory Visit (HOSPITAL_COMMUNITY): Payer: Self-pay | Admitting: Unknown Physician Specialty

## 2020-01-25 ENCOUNTER — Encounter (HOSPITAL_COMMUNITY): Payer: Self-pay | Admitting: Cardiology

## 2020-01-25 ENCOUNTER — Ambulatory Visit (HOSPITAL_COMMUNITY): Payer: Self-pay | Admitting: Pharmacist

## 2020-01-25 DIAGNOSIS — Z79899 Other long term (current) drug therapy: Secondary | ICD-10-CM

## 2020-01-25 DIAGNOSIS — G4733 Obstructive sleep apnea (adult) (pediatric): Secondary | ICD-10-CM | POA: Diagnosis present

## 2020-01-25 DIAGNOSIS — Z888 Allergy status to other drugs, medicaments and biological substances status: Secondary | ICD-10-CM

## 2020-01-25 DIAGNOSIS — A4901 Methicillin susceptible Staphylococcus aureus infection, unspecified site: Secondary | ICD-10-CM | POA: Diagnosis not present

## 2020-01-25 DIAGNOSIS — I110 Hypertensive heart disease with heart failure: Secondary | ICD-10-CM | POA: Diagnosis present

## 2020-01-25 DIAGNOSIS — Z86718 Personal history of other venous thrombosis and embolism: Secondary | ICD-10-CM

## 2020-01-25 DIAGNOSIS — Z91048 Other nonmedicinal substance allergy status: Secondary | ICD-10-CM | POA: Diagnosis not present

## 2020-01-25 DIAGNOSIS — F17210 Nicotine dependence, cigarettes, uncomplicated: Secondary | ICD-10-CM | POA: Diagnosis present

## 2020-01-25 DIAGNOSIS — T8141XA Infection following a procedure, superficial incisional surgical site, initial encounter: Secondary | ICD-10-CM | POA: Diagnosis not present

## 2020-01-25 DIAGNOSIS — Z7982 Long term (current) use of aspirin: Secondary | ICD-10-CM

## 2020-01-25 DIAGNOSIS — Z20822 Contact with and (suspected) exposure to covid-19: Secondary | ICD-10-CM | POA: Diagnosis present

## 2020-01-25 DIAGNOSIS — L03313 Cellulitis of chest wall: Secondary | ICD-10-CM | POA: Diagnosis present

## 2020-01-25 DIAGNOSIS — Z4801 Encounter for change or removal of surgical wound dressing: Secondary | ICD-10-CM | POA: Insufficient documentation

## 2020-01-25 DIAGNOSIS — Z95811 Presence of heart assist device: Secondary | ICD-10-CM

## 2020-01-25 DIAGNOSIS — Z7901 Long term (current) use of anticoagulants: Secondary | ICD-10-CM | POA: Insufficient documentation

## 2020-01-25 DIAGNOSIS — T829XXA Unspecified complication of cardiac and vascular prosthetic device, implant and graft, initial encounter: Secondary | ICD-10-CM

## 2020-01-25 DIAGNOSIS — I428 Other cardiomyopathies: Secondary | ICD-10-CM | POA: Diagnosis present

## 2020-01-25 DIAGNOSIS — J9382 Other air leak: Secondary | ICD-10-CM | POA: Diagnosis not present

## 2020-01-25 DIAGNOSIS — Z7984 Long term (current) use of oral hypoglycemic drugs: Secondary | ICD-10-CM | POA: Diagnosis not present

## 2020-01-25 DIAGNOSIS — E119 Type 2 diabetes mellitus without complications: Secondary | ICD-10-CM | POA: Diagnosis present

## 2020-01-25 DIAGNOSIS — J869 Pyothorax without fistula: Secondary | ICD-10-CM

## 2020-01-25 DIAGNOSIS — B9561 Methicillin susceptible Staphylococcus aureus infection as the cause of diseases classified elsewhere: Secondary | ICD-10-CM | POA: Diagnosis present

## 2020-01-25 DIAGNOSIS — T829XXD Unspecified complication of cardiac and vascular prosthetic device, implant and graft, subsequent encounter: Secondary | ICD-10-CM | POA: Diagnosis not present

## 2020-01-25 DIAGNOSIS — Z978 Presence of other specified devices: Secondary | ICD-10-CM | POA: Diagnosis not present

## 2020-01-25 DIAGNOSIS — Y831 Surgical operation with implant of artificial internal device as the cause of abnormal reaction of the patient, or of later complication, without mention of misadventure at the time of the procedure: Secondary | ICD-10-CM | POA: Diagnosis present

## 2020-01-25 DIAGNOSIS — T827XXA Infection and inflammatory reaction due to other cardiac and vascular devices, implants and grafts, initial encounter: Secondary | ICD-10-CM | POA: Diagnosis present

## 2020-01-25 DIAGNOSIS — E876 Hypokalemia: Secondary | ICD-10-CM | POA: Diagnosis not present

## 2020-01-25 DIAGNOSIS — K7460 Unspecified cirrhosis of liver: Secondary | ICD-10-CM | POA: Diagnosis present

## 2020-01-25 DIAGNOSIS — I5022 Chronic systolic (congestive) heart failure: Secondary | ICD-10-CM | POA: Diagnosis present

## 2020-01-25 DIAGNOSIS — L02213 Cutaneous abscess of chest wall: Secondary | ICD-10-CM | POA: Diagnosis not present

## 2020-01-25 DIAGNOSIS — M961 Postlaminectomy syndrome, not elsewhere classified: Secondary | ICD-10-CM | POA: Diagnosis present

## 2020-01-25 DIAGNOSIS — E785 Hyperlipidemia, unspecified: Secondary | ICD-10-CM | POA: Diagnosis present

## 2020-01-25 DIAGNOSIS — B999 Unspecified infectious disease: Secondary | ICD-10-CM

## 2020-01-25 DIAGNOSIS — J853 Abscess of mediastinum: Secondary | ICD-10-CM | POA: Diagnosis present

## 2020-01-25 DIAGNOSIS — Z09 Encounter for follow-up examination after completed treatment for conditions other than malignant neoplasm: Secondary | ICD-10-CM

## 2020-01-25 DIAGNOSIS — T8142XA Infection following a procedure, deep incisional surgical site, initial encounter: Secondary | ICD-10-CM | POA: Diagnosis not present

## 2020-01-25 HISTORY — DX: Essential (primary) hypertension: I10

## 2020-01-25 HISTORY — DX: Type 2 diabetes mellitus without complications: E11.9

## 2020-01-25 LAB — CBC
HCT: 42 % (ref 39.0–52.0)
Hemoglobin: 14.5 g/dL (ref 13.0–17.0)
MCH: 31.4 pg (ref 26.0–34.0)
MCHC: 34.5 g/dL (ref 30.0–36.0)
MCV: 90.9 fL (ref 80.0–100.0)
Platelets: 381 10*3/uL (ref 150–400)
RBC: 4.62 MIL/uL (ref 4.22–5.81)
RDW: 12.3 % (ref 11.5–15.5)
WBC: 11.6 10*3/uL — ABNORMAL HIGH (ref 4.0–10.5)
nRBC: 0 % (ref 0.0–0.2)

## 2020-01-25 LAB — BLOOD GAS, ARTERIAL
Acid-Base Excess: 2 mmol/L (ref 0.0–2.0)
Bicarbonate: 23.6 mmol/L (ref 20.0–28.0)
FIO2: 21
O2 Saturation: 97.9 %
Patient temperature: 37
pCO2 arterial: 22.5 mmHg — ABNORMAL LOW (ref 32.0–48.0)
pH, Arterial: 7.625 (ref 7.350–7.450)
pO2, Arterial: 84.4 mmHg (ref 83.0–108.0)

## 2020-01-25 LAB — COMPREHENSIVE METABOLIC PANEL
ALT: 21 U/L (ref 0–44)
AST: 20 U/L (ref 15–41)
Albumin: 2.9 g/dL — ABNORMAL LOW (ref 3.5–5.0)
Alkaline Phosphatase: 89 U/L (ref 38–126)
Anion gap: 13 (ref 5–15)
BUN: <5 mg/dL — ABNORMAL LOW (ref 6–20)
CO2: 27 mmol/L (ref 22–32)
Calcium: 9.2 mg/dL (ref 8.9–10.3)
Chloride: 99 mmol/L (ref 98–111)
Creatinine, Ser: 0.93 mg/dL (ref 0.61–1.24)
GFR calc Af Amer: >60 mL/min (ref >60)
GFR calc non Af Amer: >60 mL/min (ref >60)
Glucose, Bld: 126 mg/dL — ABNORMAL HIGH (ref 70–99)
Potassium: 3.4 mmol/L — ABNORMAL LOW (ref 3.5–5.1)
Sodium: 139 mmol/L (ref 135–145)
Total Bilirubin: 0.8 mg/dL (ref 0.3–1.2)
Total Protein: 7.3 g/dL (ref 6.5–8.1)

## 2020-01-25 LAB — URINALYSIS, ROUTINE W REFLEX MICROSCOPIC
Bacteria, UA: NONE SEEN
Bilirubin Urine: NEGATIVE
Glucose, UA: NEGATIVE mg/dL
Ketones, ur: NEGATIVE mg/dL
Leukocytes,Ua: NEGATIVE
Nitrite: NEGATIVE
Protein, ur: NEGATIVE mg/dL
Specific Gravity, Urine: 1.032 — ABNORMAL HIGH (ref 1.005–1.030)
pH: 8 (ref 5.0–8.0)

## 2020-01-25 LAB — PREALBUMIN: Prealbumin: 12 mg/dL — ABNORMAL LOW (ref 18–38)

## 2020-01-25 LAB — PROTIME-INR
INR: 3.2 — ABNORMAL HIGH (ref 0.8–1.2)
Prothrombin Time: 32.6 seconds — ABNORMAL HIGH (ref 11.4–15.2)

## 2020-01-25 LAB — PREPARE RBC (CROSSMATCH)

## 2020-01-25 LAB — LACTATE DEHYDROGENASE: LDH: 163 U/L (ref 98–192)

## 2020-01-25 LAB — MRSA PCR SCREENING: MRSA by PCR: NEGATIVE

## 2020-01-25 MED ORDER — METHOCARBAMOL 500 MG PO TABS: 750.0000 mg | ORAL_TABLET | Freq: Three times a day (TID) | ORAL | Status: DC | PRN

## 2020-01-25 MED ORDER — CEFAZOLIN SODIUM-DEXTROSE 2-4 GM/100ML-% IV SOLN
2.0000 g | INTRAVENOUS | Status: AC
  Administered 2020-01-26: 2 g via INTRAVENOUS

## 2020-01-25 MED ORDER — IPRATROPIUM-ALBUTEROL 20-100 MCG/ACT IN AERS: 1.0000 | INHALATION_SPRAY | Freq: Four times a day (QID) | RESPIRATORY_TRACT | Status: DC

## 2020-01-25 MED ORDER — PIPERACILLIN-TAZOBACTAM 3.375 G IVPB
3.3750 g | Freq: Three times a day (TID) | INTRAVENOUS | Status: DC
  Administered 2020-01-25 – 2020-01-27 (×6): 3.375 g via INTRAVENOUS
  Filled 2020-01-25 (×5): qty 50

## 2020-01-25 MED ORDER — DOCUSATE SODIUM 100 MG PO CAPS: 200.0000 mg | ORAL_CAPSULE | Freq: Every day | ORAL | Status: DC | PRN

## 2020-01-25 MED ORDER — SPIRONOLACTONE 25 MG PO TABS
25.0000 mg | ORAL_TABLET | Freq: Every day | ORAL | Status: DC
  Administered 2020-01-27 – 2020-02-09 (×14): 25 mg via ORAL
  Filled 2020-01-25 (×14): qty 1

## 2020-01-25 MED ORDER — POTASSIUM CHLORIDE CRYS ER 20 MEQ PO TBCR
40.0000 meq | EXTENDED_RELEASE_TABLET | Freq: Once | ORAL | Status: AC
  Administered 2020-01-25: 40 meq via ORAL
  Filled 2020-01-25: qty 2

## 2020-01-25 MED ORDER — SODIUM CHLORIDE 0.9% FLUSH
3.0000 mL | INTRAVENOUS | Status: DC | PRN
  Administered 2020-01-27: 3 mL via INTRAVENOUS

## 2020-01-25 MED ORDER — SACUBITRIL-VALSARTAN 49-51 MG PO TABS
1.0000 | ORAL_TABLET | Freq: Two times a day (BID) | ORAL | Status: DC
  Administered 2020-01-25 – 2020-02-02 (×16): 1 via ORAL
  Filled 2020-01-25 (×17): qty 1

## 2020-01-25 MED ORDER — PANTOPRAZOLE SODIUM 40 MG PO TBEC
40.0000 mg | DELAYED_RELEASE_TABLET | Freq: Every day | ORAL | Status: DC
  Administered 2020-01-27 – 2020-02-09 (×14): 40 mg via ORAL
  Filled 2020-01-25 (×14): qty 1

## 2020-01-25 MED ORDER — DULOXETINE HCL 60 MG PO CPEP
60.0000 mg | ORAL_CAPSULE | Freq: Every day | ORAL | Status: DC
  Administered 2020-01-27 – 2020-02-09 (×14): 60 mg via ORAL
  Filled 2020-01-25 (×14): qty 1

## 2020-01-25 MED ORDER — AMLODIPINE BESYLATE 10 MG PO TABS: 10.0000 mg | ORAL_TABLET | Freq: Every day | ORAL | Status: DC

## 2020-01-25 MED ORDER — SODIUM CHLORIDE 0.9 % IV SOLN: 250.0000 mL | INTRAVENOUS | Status: DC | PRN

## 2020-01-25 MED ORDER — SODIUM CHLORIDE 0.9% IV SOLUTION: Freq: Once | INTRAVENOUS | Status: AC

## 2020-01-25 MED ORDER — ONDANSETRON HCL 4 MG/2ML IJ SOLN: 4.0000 mg | Freq: Four times a day (QID) | INTRAMUSCULAR | Status: DC | PRN

## 2020-01-25 MED ORDER — VANCOMYCIN HCL 1250 MG/250ML IV SOLN
1250.0000 mg | Freq: Two times a day (BID) | INTRAVENOUS | Status: DC
  Administered 2020-01-26 – 2020-01-28 (×5): 1250 mg via INTRAVENOUS
  Filled 2020-01-25 (×6): qty 250

## 2020-01-25 MED ORDER — HYDROXYZINE HCL 25 MG PO TABS
25.0000 mg | ORAL_TABLET | Freq: Three times a day (TID) | ORAL | Status: DC | PRN
  Administered 2020-01-25: 17:00:00 25 mg via ORAL
  Filled 2020-01-25: qty 1

## 2020-01-25 MED ORDER — VITAMIN B-12 1000 MCG PO TABS
1000.0000 ug | ORAL_TABLET | Freq: Every day | ORAL | Status: DC
  Administered 2020-01-27 – 2020-02-09 (×14): 1000 ug via ORAL
  Filled 2020-01-25 (×14): qty 1

## 2020-01-25 MED ORDER — ACETAMINOPHEN 325 MG PO TABS
650.0000 mg | ORAL_TABLET | ORAL | Status: DC | PRN
  Administered 2020-02-03 – 2020-02-09 (×12): 650 mg via ORAL
  Filled 2020-01-25 (×13): qty 2

## 2020-01-25 MED ORDER — ZOLPIDEM TARTRATE 5 MG PO TABS
5.0000 mg | ORAL_TABLET | Freq: Every evening | ORAL | Status: DC | PRN
  Administered 2020-01-25 – 2020-02-08 (×14): 5 mg via ORAL
  Filled 2020-01-25 (×14): qty 1

## 2020-01-25 MED ORDER — ZINC SULFATE 220 (50 ZN) MG PO CAPS
220.0000 mg | ORAL_CAPSULE | Freq: Every day | ORAL | Status: DC
  Administered 2020-01-27 – 2020-02-09 (×14): 220 mg via ORAL
  Filled 2020-01-25 (×14): qty 1

## 2020-01-25 MED ORDER — ZINC GLUCONATE 100 MG PO TABS: 100.0000 mg | ORAL_TABLET | Freq: Two times a day (BID) | ORAL | Status: DC

## 2020-01-25 MED ORDER — ATORVASTATIN CALCIUM 10 MG PO TABS
20.0000 mg | ORAL_TABLET | Freq: Every day | ORAL | Status: DC
  Administered 2020-01-27 – 2020-02-05 (×10): 20 mg via ORAL
  Filled 2020-01-25 (×10): qty 2

## 2020-01-25 MED ORDER — ASPIRIN EC 81 MG PO TBEC
81.0000 mg | DELAYED_RELEASE_TABLET | Freq: Every day | ORAL | Status: DC
  Administered 2020-01-27 – 2020-02-09 (×14): 81 mg via ORAL
  Filled 2020-01-25 (×14): qty 1

## 2020-01-25 MED ORDER — FENTANYL CITRATE (PF) 100 MCG/2ML IJ SOLN
50.0000 ug | Freq: Once | INTRAMUSCULAR | Status: AC
  Administered 2020-01-25: 50 ug via INTRAVENOUS

## 2020-01-25 MED ORDER — VANCOMYCIN HCL 1750 MG/350ML IV SOLN
1750.0000 mg | Freq: Once | INTRAVENOUS | Status: AC
  Administered 2020-01-25: 1750 mg via INTRAVENOUS
  Filled 2020-01-25 (×2): qty 350

## 2020-01-25 MED ORDER — HYDROXYZINE PAMOATE 25 MG PO CAPS
25.0000 mg | ORAL_CAPSULE | Freq: Three times a day (TID) | ORAL | Status: DC | PRN
  Filled 2020-01-25: qty 1

## 2020-01-25 MED ORDER — SODIUM CHLORIDE 0.9 % IV SOLN
INTRAVENOUS | Status: DC | PRN
  Administered 2020-01-26 – 2020-02-07 (×2): 250 mL via INTRAVENOUS

## 2020-01-25 MED ORDER — SODIUM CHLORIDE 0.9% FLUSH
3.0000 mL | Freq: Two times a day (BID) | INTRAVENOUS | Status: DC
  Administered 2020-01-25 – 2020-02-08 (×15): 3 mL via INTRAVENOUS

## 2020-01-25 MED ORDER — HYDRALAZINE HCL 20 MG/ML IJ SOLN
10.0000 mg | INTRAMUSCULAR | Status: DC | PRN
  Administered 2020-01-25 – 2020-02-03 (×2): 10 mg via INTRAVENOUS
  Filled 2020-01-25 (×2): qty 1

## 2020-01-25 MED ORDER — IPRATROPIUM-ALBUTEROL 0.5-2.5 (3) MG/3ML IN SOLN
3.0000 mL | Freq: Four times a day (QID) | RESPIRATORY_TRACT | Status: DC
  Administered 2020-01-25: 3 mL via RESPIRATORY_TRACT
  Filled 2020-01-25: qty 3

## 2020-01-25 MED ORDER — IOHEXOL 300 MG/ML  SOLN
100.0000 mL | Freq: Once | INTRAMUSCULAR | Status: AC | PRN
  Administered 2020-01-25: 100 mL via INTRAVENOUS

## 2020-01-25 MED ORDER — VANCOMYCIN HCL 1750 MG/350ML IV SOLN: 1750.0000 mg | Freq: Once | INTRAVENOUS | Status: DC

## 2020-01-25 MED ORDER — THIAMINE HCL 100 MG PO TABS
100.0000 mg | ORAL_TABLET | Freq: Every day | ORAL | Status: DC
  Administered 2020-01-27 – 2020-02-09 (×14): 100 mg via ORAL
  Filled 2020-01-25 (×14): qty 1

## 2020-01-25 MED ORDER — SACUBITRIL-VALSARTAN 24-26 MG PO TABS: 1.0000 | ORAL_TABLET | Freq: Two times a day (BID) | ORAL | Status: DC

## 2020-01-25 MED ORDER — FENTANYL CITRATE (PF) 100 MCG/2ML IJ SOLN
100.0000 ug | Freq: Once | INTRAMUSCULAR | Status: DC
  Filled 2020-01-25: qty 2

## 2020-01-25 MED ORDER — SACUBITRIL-VALSARTAN 97-103 MG PO TABS
1.0000 | ORAL_TABLET | Freq: Two times a day (BID) | ORAL | Status: DC
  Filled 2020-01-25: qty 1

## 2020-01-25 MED ORDER — WARFARIN - PHARMACIST DOSING INPATIENT: Freq: Every day | Status: DC

## 2020-01-25 MED ORDER — POLYETHYLENE GLYCOL 3350 17 G PO PACK: 17.0000 g | PACK | Freq: Every day | ORAL | Status: DC | PRN

## 2020-01-25 MED ORDER — FLUTICASONE PROPIONATE 50 MCG/ACT NA SUSP: 1.0000 | Freq: Two times a day (BID) | NASAL | Status: DC | PRN

## 2020-01-25 NOTE — Progress Notes (Signed)
LVAD INR 

## 2020-01-25 NOTE — Progress Notes (Addendum)
Patient presents for sick visit in VAD Clinic today with his wife. Reports no problems with VAD equipment or concerns with drive line. Denies any falls, dizziness, lightheadedness, or syncope.   Pts caregiver paged this morning stating that he has a large sternal abscess that is painful. On arrival to clinic pt has large sternal abscess that is red and tender to touch. There is a 2-3" reddened area on streaking down abdomen. Pt denies any fever or chills.      His wife has continued daily dressing changes and he denies any issues with VAD drive line exit site.  Vital Signs:  Temp:  98 Doppler Pressure: 82 Automatc BP: 96/36 (73) HR: 73 SPO2: 99% RA  Weight: 184.2 lb w/o eqt Last weight: 187.4 lb  VAD Indication: Destination Therapy due to smoking status  VAD interrogation & Equipment Management: Speed: 5700 Flow:  4.9 Power:  4.5w    PI: 3.7 Hct: 42  Alarms: no clinical alarms Events:  70+ PI events daily Fixed speed: 5700 Low speed limit: 5400  Primary Controller:  Replace back up battery in 16 months. Back up controller:   Replace back up battery in 29 months.  Annual Equipment Maintenance on UBC/PM was performed on 07/2019.   Back up battery charged in back up controller during clinic visit.   I reviewed the LVAD parameters from today and compared the results to the patient's prior recorded data. LVAD interrogation was NEGATIVE for significant power changes, NEGATIVE for clinical alarms and NEGATIVE for PI events/speed drops. No programming changes were made and pump is functioning within specified parameters. Pt is performing daily controller and system monitor self tests along with completing weekly and monthly maintenance for LVAD equipment.  LVAD equipment check completed and is in good working order. Back-up equipment present.   Exit Site Care: Drive line is being maintained daily by [name]. Gauze dressing with anchor intact and accurately applied.  Existing  dressing removed using sterile technique. Site cleansed with betadine swab x 2 and allowed to dry. Covered site with gauze dressing. Pt has small amount of thick yellow drainage on the gauze with no foul odor, rash, or tenderness noted. Hypergranulation tissues notes. Exit site with complete tissue ingrowth. Tunnels 1 cm along outer portion of exit site. Pt has sufficient dressings at home. Next dressing change is due tomorrow 01/26/20.     Device: N/A   BP & Labs:  Doppler 82 - Doppler is reflecting MAP   Hgb 14.5 - No S/S of bleeding. Specifically denies melena/BRBPR or nosebleeds.   LDH stable at 163 with established baseline of 150 - 220. Denies tea-colored urine. No power elevations noted on interrogation.   22g PIV placed in left wrist.  BC drawn and sent x 2.   IV Vanc 1750 mg infusing currently.  Patient Instructions: 1. Admit to [facility] for surgical intervention of sternal abscess.  [name] RN VAD Coordinator  Office: [phone]  24/7 Pager: [phone]

## 2020-01-25 NOTE — H&P (Addendum)
Advanced Heart Failure Team History and Physical Note   PCP:  [name], MD  PCP-Cardiology: Dr. Shirlee Latch     Reason for Admission: Sternal Abcess  HPI:    59 y.o. with history of nonischemic cardiomyopathy, RLE DVT, cirrhosis, smoking, and OSA returns for followup of CHF/LVAD placement w/ CC of large/painful sternal abscess.  His Cardiomyopathy was first diagnosed in 6/19 in [place]. LHC/RHC at that time showed low output.  He was admitted to [facility] in 9/19 with low output HF and was started on milrinone and diuresed.  Unable to wean off milrinone.  He had a degree of RV failure, but this improved on milrinone.  Valvular heart disease also looked better with milrinone and diuresis.  On 08/03/18, he had Heartmate 3 LVAD placed.  Speed was optimized by ramp echo post-op.  Post-op course was relatively unremarkable.  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  No abscess on CT.  He was started on cefazolin IV for 6 wks.  BP-active meds decreased with low MAPs.   He is now on chronic Keflex for MSSA suppression.   Pt now presents to clinic w/ new large sternal abscess that is red and tender to touch. Complains of 7/10 pain. With low grade fever, temp 99.3 in clinic. Complains of chills.     VAD interrogation & Equipment Management: Speed: 5700 Flow:  4.9 Power:  4.5w PI: 3.7 Hct: 42  Alarms: no clinical alarms Events:  70+ PI events daily Fixed speed: 5700 Low speed limit: 5400    Review of Systems: [y] = yes, [ ]  = no   General: Weight gain [ ] ; Weight loss [ ] ; Anorexia [ ] ; Fatigue [ ] ; Fever [ ] ; Chills [ ] ; Weakness [ ]   Cardiac: Chest pain/pressure [ ] ; Resting SOB [ ] ; Exertional SOB [ ] ; Orthopnea [ ] ; Pedal Edema [ ] ; Palpitations [ ] ; Syncope [ ] ; Presyncope [ ] ; Paroxysmal nocturnal dyspnea[ ]   Pulmonary: Cough [ ] ; Wheezing[ ] ; Hemoptysis[ ] ; Sputum [ ] ; Snoring [ ]   GI:  Vomiting[ ] ; Dysphagia[ ] ; Melena[ ] ; Hematochezia [ ] ; Heartburn[ ] ; Abdominal pain [ ] ; Constipation [ ] ; Diarrhea [ ] ; BRBPR [ ]   GU: Hematuria[ ] ; Dysuria [ ] ; Nocturia[ ]   Vascular: Pain in legs with walking [ ] ; Pain in feet with lying flat [ ] ; Non-healing sores [ ] ; Stroke [ ] ; TIA [ ] ; Slurred speech [ ] ;  Neuro: Headaches[ ] ; Vertigo[ ] ; Seizures[ ] ; Paresthesias[ ] ;Blurred vision [ ] ; Diplopia [ ] ; Vision changes [ ]   Ortho/Skin: Arthritis [ ] ; Joint pain [ ] ; Muscle pain [ ] ; Joint swelling [ ] ; Back Pain [ ] ; Rash [ ]   Psych: Depression[ ] ; Anxiety[ ]   Heme: Bleeding problems [ ] ; Clotting disorders [ ] ; Anemia [ ]   Endocrine: Diabetes [ ] ; Thyroid dysfunction[ ]    Home Medications Prior to Admission medications   Medication Sig Start Date End Date Taking? Authorizing Provider  warfarin (COUMADIN) 5 MG tablet TAKE 1 TABLET BY MOUTH DAILY EXCEPT 1 AND 1/2 TABLETS ON MONDAY, [facility] AND FRIDAY AS DIRECTED 12/20/19   , MD  amLODipine (NORVASC) 5 MG tablet Take 2 tablets (10 mg total) by mouth daily. 09/29/19 12/28/19  , MD  aspirin EC 81 MG tablet Take 81 mg by mouth daily.    [provider]  atorvastatin (LIPITOR) 20 MG tablet Take  1 tablet (20 mg total) by mouth daily. 07/14/19   Larey Dresser, MD  carbamide peroxide (DEBROX) 6.5 % OTIC solution Place 5 drops into both ears 3 (three) times daily. 12/10/18   Clegg, Amy D, NP  cephALEXin (KEFLEX) 500 MG capsule Take 1 capsule (500 mg total) by mouth 3 (three) times daily. 08/12/19   [place], NP  cyanocobalamin 500 MCG tablet Take 1,000 mcg by mouth daily.     [provider]  docusate sodium (COLACE) 100 MG capsule Take 2 capsules (200 mg total) by mouth daily as needed for mild constipation. 08/12/18   Clegg, Amy D, NP  DULoxetine (CYMBALTA) 60 MG capsule Take 60 mg by mouth daily.    [provider]  enoxaparin (LOVENOX) 40 MG/0.4ML injection Inject 0.4 mLs (40 mg  total) into the skin daily. Patient not taking: Reported on 09/15/2019 07/29/19   Larey Dresser, MD  fluticasone [facility]) 50 MCG/ACT nasal spray Place 1 spray into both nostrils 2 (two) times daily as needed for allergies.     [provider]  gabapentin (NEURONTIN) 300 MG capsule Take 600 mg by mouth 3 (three) times daily.    [provider]  glucose blood (CONTOUR NEXT TEST) test strip Use as instructed 12/18/18   Larey Dresser, MD  hydrOXYzine (VISTARIL) 25 MG capsule Take 25 mg by mouth 3 (three) times daily as needed for anxiety.    [provider]  Ipratropium-Albuterol (COMBIVENT) 20-100 MCG/ACT AERS respimat Inhale 1 puff into the lungs every 6 (six) hours.    [provider]  Lancets (FREESTYLE) lancets Use as instructed 12/18/18   Larey Dresser, MD  metFORMIN (GLUCOPHAGE-XR) 500 MG 24 hr tablet Take 500 mg by mouth 2 (two) times daily.    [provider]  methocarbamol (ROBAXIN) 750 MG tablet Take 750 mg by mouth 3 (three) times daily as needed for muscle spasms.  02/21/14   [provider]  nicotine (NICODERM CQ - DOSED IN MG/24 HR) 7 mg/24hr patch Place 1 patch (7 mg total) onto the skin daily as needed (tobacco craving). Patient not taking: Reported on 09/15/2019 08/12/18   Darrick Grinder D, NP  nystatin (MYCOSTATIN/NYSTOP) powder Apply 1 application topically 3 (three) times daily. 12/13/19   Larey Dresser, MD  omeprazole (PRILOSEC) 20 MG capsule Take 20 mg by mouth daily.    [provider]  polyethylene glycol (MIRALAX / GLYCOLAX) packet Take 17 g by mouth daily as needed for mild constipation.    [provider]  sacubitril-valsartan (ENTRESTO) 97-103 MG Take 1 tablet by mouth 2 (two) times daily. 09/15/19   Larey Dresser, MD  sildenafil (VIAGRA) 100 MG tablet Take 50 mg by mouth daily as needed for erectile dysfunction.    [provider]  spironolactone (ALDACTONE) 25 MG tablet Take 1 tablet (25  mg total) by mouth daily. 10/14/18   Larey Dresser, MD  thiamine 100 MG tablet Take 1 tablet (100 mg total) by mouth daily. 04/05/19   Georgiana Shore, NP  traMADol (ULTRAM) 50 MG tablet Take 1 tablet (50 mg total) by mouth every 4 (four) hours as needed for moderate pain. 08/12/18   Clegg, Amy D, NP  traMADol (ULTRAM) 50 MG tablet Take 1 tablet (50 mg total) by mouth every 6 (six) hours as needed for severe pain. 08/31/19   Lawyer, Harrell Gave, PA-C  varenicline (CHANTIX STARTING MONTH PAK) 0.5 MG X 11 & 1 MG X 42  tablet Take one 0.5 mg tablet by mouth once daily for 3 days, then increase to one 0.5 mg tablet twice daily for 4 days, then increase to one 1 mg tablet twice daily. 11/17/19   Laurey Morale, MD  Zinc Gluconate 100 MG TABS Take 1 tablet (100 mg total) by mouth daily. Patient taking differently: Take 100 mg by mouth 2 (two) times a day.  12/15/18   Kerin Perna, MD  zolpidem (AMBIEN) 5 MG tablet Take 1 tablet (5 mg total) by mouth at bedtime. 06/16/19   Laurey Morale, MD    Past Medical History: Past Medical History:  Diagnosis Date  . Cardiomyopathy, unspecified (HCC)   . CHF (congestive heart failure) (HCC)   . Chronic back pain   . Enlarged heart   . Gastric ulcer   . Gastroenteritis   . H/O degenerative disc disease   . LVAD (left ventricular assist device) present [facility])     Past Surgical History: Past Surgical History:  Procedure Laterality Date  . APPLICATION OF WOUND VAC N/A 12/03/2018   Procedure: APPLICATION OF WOUND VAC;  Surgeon: Donata Clay, Theron Arista, MD;  Location: [facility] OR;  Service: Thoracic;  Laterality: N/A;  . APPLICATION OF WOUND VAC N/A 12/09/2018   Procedure: Removal of Wound Vac, Application of ACELL;  Surgeon: Kerin Perna, MD;  Location: [facility] OR;  Service: Thoracic;  Laterality: N/A;  . INSERTION OF IMPLANTABLE LEFT VENTRICULAR ASSIST DEVICE N/A 08/03/2018   Procedure: INSERTION OF IMPLANTABLE LEFT VENTRICULAR ASSIST DEVICE - HM3;  Surgeon: Alleen Borne, MD;  Location: [facility] OR;  Service: Open Heart Surgery;  Laterality: N/A;  HM3  . LUMBAR LAMINECTOMY/DECOMPRESSION MICRODISCECTOMY    . ORCHIECTOMY    . RIGHT HEART CATH N/A 07/24/2018   Procedure: RIGHT HEART CATH;  Surgeon: Laurey Morale, MD;  Location: [facility] INVASIVE CV LAB;  Service: Cardiovascular;  Laterality: N/A;  . STERNAL WOUND DEBRIDEMENT N/A 12/03/2018   Procedure: WOUND DEBRIDEMENT WITH A-CELL;  Surgeon: Kerin Perna, MD;  Location: [facility] OR;  Service: Thoracic;  Laterality: N/A;  . TEE WITHOUT CARDIOVERSION N/A 08/03/2018   Procedure: TRANSESOPHAGEAL ECHOCARDIOGRAM (TEE);  Surgeon: Alleen Borne, MD;  Location: [facility] OR;  Service: Open Heart Surgery;  Laterality: N/A;    Family History:  No family history on file.  Social History: Social History   Socioeconomic History  . Marital status: Single    Spouse name: Not on file  . Number of children: 7  . Years of education: 9th  . Highest education level: 9th grade  Occupational History  . Occupation: disabled  Tobacco Use  . Smoking status: Former Smoker    Types: Cigarettes    Start date: 2003  . Smokeless tobacco: Never Used  . Tobacco comment: Nicoderm patch   Substance and Sexual Activity  . Alcohol use: Not Currently  . Drug use: Not Currently  . Sexual activity: Not on file  Other Topics Concern  . Not on file  Social History Narrative  . Not on file   Social Determinants of Health   Financial Resource Strain:   . Difficulty of Paying Living Expenses: Not on file  Food Insecurity:   . Worried About Programme researcher, broadcasting/film/video in the Last Year: Not on file  . Ran Out of Food in the Last Year: Not on file  Transportation Needs:   . Lack of Transportation (Medical): Not on file  . Lack of Transportation (Non-Medical): Not on file  Physical Activity:   . Days of Exercise per Week: Not on file  . Minutes of Exercise per Session: Not on file  Stress:   . Feeling of Stress : Not on file  Social Connections:    . Frequency of Communication with Friends and Family: Not on file  . Frequency of Social Gatherings with Friends and Family: Not on file  . Attends Religious Services: Not on file  . Active Member of Clubs or Organizations: Not on file  . Attends Banker Meetings: Not on file  . Marital Status: Not on file    Allergies:  Allergies  Allergen Reactions  . Chlorhexidine Rash    Objective:    Vital Signs:   BP: ()/()  Arterial Line BP: ()/()    There were no vitals filed for this visit.   Physical Exam    General:  Well appearing. No respiratory difficulty HEENT: Normal Neck: Supple. no JVD. Carotids 2+ bilat; no bruits. No lymphadenopathy or thyromegaly appreciated. Cor: + LVAD Hum  Chest Wall: large, swollen erythematous abscess at distal end of sternotomy scar, tender to touch. No visible drainage Lungs: Clear Abdomen: Soft, nontender, nondistended. No hepatosplenomegaly. No bruits or masses. Good bowel sounds. Drive Line Site: anchor in place, small amount of thick yellow drainage on gauze, no foul odor rash or tenderness  Extremities: No cyanosis, clubbing, rash, edema Neuro: Alert & oriented x 3, cranial nerves grossly intact. moves all 4 extremities w/o difficulty. Affect pleasant.   Labs     Basic Metabolic Panel: Recent Labs  Lab 01/25/20 0958  NA 139  K 3.4*  CL 99  CO2 27  GLUCOSE 126*  BUN <5*  CREATININE 0.93  CALCIUM 9.2    Liver Function Tests: Recent Labs  Lab 01/25/20 0958  AST 20  ALT 21  ALKPHOS 89  BILITOT 0.8  PROT 7.3  ALBUMIN 2.9*   No results for input(s): LIPASE, AMYLASE in the last 168 hours. No results for input(s): AMMONIA in the last 168 hours.  CBC: Recent Labs  Lab 01/25/20 0958  WBC 11.6*  HGB 14.5  HCT 42.0  MCV 90.9  PLT 381    Cardiac Enzymes: No results for input(s): CKTOTAL, CKMB, CKMBINDEX, TROPONINI in the last 168 hours.  BNP: BNP (last 3 results) No results for input(s): BNP in the  last 8760 hours.  ProBNP (last 3 results) No results for input(s): PROBNP in the last 8760 hours.   CBG: No results for input(s): GLUCAP in the last 168 hours.  Coagulation Studies: Recent Labs    01/25/20 0958  LABPROT 32.6*  INR 3.2*    Imaging:  No results found.    Assessment/Plan   1. Sternal Abscess: - obtain blood cultures and start broad spectrum abx - start Vancomycin + Zosyn per pharmacy, can hold Keflex for now.   - check CBC - plan OR tomorrow for wound irrigation and debridement   2. LVAD:  - interrogation ok/ drive line site ok  - LDH stable  - hold coumadin in anticipation for procedure tomorrow  - FFP for INR reversal   3. 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  LVAD parameters. NYHA class I-II.   He does not look volume overloaded. Continue home regimen.   MAP stable currently.  - Hold amlodipine for now.   - Continue spironolactone 25 mg daily.  - Continue Entresto to 97/103 bid.  - Should be transplant candidate eventually if  he can quit smoking.  Will make transplant clinic referral when he is off totally.   4. H/o RLE DVT: On warfarin for LVAD.  - Will hold for surgical procedure.  - can use SCDs for DVT prophylaxis   5. OSA:  - Continue CPAP qhs.   5. Hyperlipidemia:  - Continue Atorvastatin 20 qhs  6. Type II diabetes:  - On Metformin. Managed by  PCP.   7. Recurrent MSSA driveline infection:  - on suppressive Keflex as outpatient. Will hold while on Vanc   Brittainy Simmons, PA-C 01/25/2020, 2:08 PM  Advanced Heart Failure Team Pager [phone] (M-F; 7a - 4p)  Please contact [facility] for night-coverage after hours (4p -7a ) and weekends on [email or web address]  Patient seen with PA, agree with the above note.   Patient reports development of swelling over the lower sternal area first noted yesterday.  No fever at home but has felt lethargic.    Stable LVAD interrogation but with more  PI events than his baseline.    Low grade fever to 99.   General: Well appearing this am. NAD.  HEENT: Normal. Neck: Supple, JVP 7-8 cm. Carotids OK.  Cardiac:  Mechanical heart sounds with LVAD hum present.  Lungs:  CTAB, normal effort.  Abdomen:  NT, ND, no HSM. No bruits or masses. +BS  LVAD exit site: Well-healed and incorporated. Dressing dry and intact. No erythema or drainage. Stabilization device present and accurately applied. Driveline dressing changed daily per sterile technique. Extremities:  Warm and dry. No cyanosis, clubbing, rash, or edema.  Neuro:  Alert & oriented x 3. Cranial nerves grossly intact. Moves all 4 extremities w/o difficulty. Affect pleasant   Skin: abscess over lower sternal area with surrounding erythema.  Driveline site benign.   Patient has a lower sternal area abscess.  It appears large with surrounding erythema.  He will need to be admitted for surgical debridement.  - Plan for OR tomorrow.  Per Dr. Donata Clay, patient will get 2 units FFP pre-OR.  INR 3.5 today, warfarin to be held.  - Hold Keflex and will give vancomycin and Zosyn.  - CT abdomen/pelvis to assess abscess.   Volume status looks ok, MAP in 70s.   - Stop amlodipine.  - Decrease Entresto dose to 49/51 bid.  - Continue spironolactone.   H/o driveline site infection but driveline site looks ok today.   Marca Ancona 01/25/2020 4:08 PM

## 2020-01-25 NOTE — Progress Notes (Signed)
Pt brought up from LVAD clinic Orson Eva RN administered 50 mcgs of fentanyl that was brought up with him and wasted 50 mcgs of fentanyl with Romeo Apple [employer] Knisely Charity fundraiser.

## 2020-01-25 NOTE — Progress Notes (Signed)
Procedure(s) (LRB): STERNAL WOUND IRRIGATION AND DEBRIDEMENT (N/A) APPLICATION OF WOUND VAC (N/A) Subjective: Patient examined and Lab values reviewed 59 yo diabetic s/p VAD 2019 Treated for drive line infection [id number] he presents with abscess over lower sternal incision Plan operative debridement tomorrow after FFP for INR 3.1 Objective: Vital signs in last 24 hours: Temp:  [99.1 F (37.3 C)-99.8 F (37.7 C)] 99.8 F (37.7 C) (03/09 1756) Pulse Rate:  [73-97] 96 (03/09 1756) Cardiac Rhythm: Normal sinus rhythm (03/09 1539) Resp:  [17-19] 19 (03/09 1756) BP: (82-131)/(0-105) 131/103 (03/09 1756) SpO2:  [99 %-100 %] 100 % (03/09 1725) Weight:  [85 kg] 85 kg (03/09 1245)  Hemodynamic parameters for last 24 hours:    Intake/Output from previous day: No intake/output data recorded. Intake/Output this shift: No intake/output data recorded.       Exam    General- alert and comfortable    Sternum- red fluctuant mass at low sternal incision    Neck- no JVD, no cervical adenopathy palpable, no carotid bruit   Lungs- clear without rales, wheezes   Cor- regular rate and rhythm, no murmur , gallop   normal VAD hum   Abdomen- soft, non-tender   Extremities - warm, non-tender, minimal edema   Neuro- oriented, appropriate, no focal weakness   Lab Results: Recent Labs    01/25/20 0958  WBC 11.6*  HGB 14.5  HCT 42.0  PLT 381   BMET:  Recent Labs    01/25/20 0958  NA 139  K 3.4*  CL 99  CO2 27  GLUCOSE 126*  BUN <5*  CREATININE 0.93  CALCIUM 9.2    PT/INR:  Recent Labs    01/25/20 0958  LABPROT 32.6*  INR 3.2*   ABG    Component Value Date/Time   PHART 7.473 (H) 08/05/2018 0345   HCO3 26.9 08/05/2018 0345   TCO2 26 08/04/2018 1622   O2SAT 73.6 08/12/2018 0345   CBG (last 3)  No results for input(s): GLUCAP in the last 72 hours.  Assessment/Plan: S/P Procedure(s) (LRB): STERNAL WOUND IRRIGATION AND DEBRIDEMENT (N/A) APPLICATION OF WOUND VAC  (N/A) Plan OR in am - procedure, benefits and risks d/w patient.IV antibiotics started   LOS: 0 days    [name] 01/25/2020

## 2020-01-25 NOTE — Progress Notes (Signed)
Pharmacy Antibiotic Note  Theodore Welch is a 59 y.o. male admitted on (Not on file) with cellulitis.  Pharmacy has been consulted for vancomycin and zosyn dosing.  Presenting to clinic with large sternal abscess with redness and painful. WBC 11.6, Tmax 99.3. Scr 0.93 (CrCl 90 mL/min).   Plan: Zosyn 3.375g IV q8h (4 hour infusion). Vancomycin 1750 mg IV then 1250 mg IV every 12 hours (estAUC 500) Monitor renal fx, cx results, clinical pic, and vanc levels as appopriate    Temp (24hrs), Avg:99.3 F (37.4 C), Min:99.3 F (37.4 C), Max:99.3 F (37.4 C)  Recent Labs  Lab 01/25/20 0958  WBC 11.6*  CREATININE 0.93    Estimated Creatinine Clearance: 90 mL/min (by C-G formula based on SCr of 0.93 mg/dL).    Allergies  Allergen Reactions  . Chlorhexidine Rash    Antimicrobials this admission: Vancomycin 3/9 >>  Zosyn 3/9 >>   Dose adjustments this admission: N/A  Microbiology results: 3/9 COVID: sent  Thank you for allowing pharmacy to be a part of this patient's care.  Sherron Monday, PharmD, BCCCP Clinical Pharmacist  Phone: [phone]  Please check AMION for all [facility] Pharmacy phone numbers After 10:00 PM, call Main Pharmacy [phone] 01/25/2020 2:02 PM

## 2020-01-25 NOTE — Progress Notes (Signed)
ANTICOAGULATION CONSULT NOTE - Initial Consult  Pharmacy Consult for warfarin Indication: LVAD  Allergies  Allergen Reactions  . Chlorhexidine Rash    Patient Measurements:   Heparin Dosing Weight: 85 kg  Vital Signs: Temp: 99.3 F (37.4 C) (03/09 1353) BP: 126/94 (03/09 1353) Pulse Rate: 88 (03/09 1353)  Labs: Recent Labs    01/25/20 0958  HGB 14.5  HCT 42.0  PLT 381  LABPROT 32.6*  INR 3.2*  CREATININE 0.93    Estimated Creatinine Clearance: 90 mL/min (by C-G formula based on SCr of 0.93 mg/dL).   Medical History: Past Medical History:  Diagnosis Date  . Cardiomyopathy, unspecified (HCC)   . CHF (congestive heart failure) (HCC)   . Chronic back pain   . Enlarged heart   . Gastric ulcer   . Gastroenteritis   . H/O degenerative disc disease   . LVAD (left ventricular assist device) present [facility])     Assessment: 58yom presenting with concern of large sternal abscess- starting on antibiotics. On warfarin PTA for hx LVAD (goal 2-2.5).   INR today came back supra-therapeutic at 3.2. Hgb 14.5, plt 381. LDH 163. No s/sx of bleeding.   PTA regimen is warfarin 5 mg daily except 7.5 mg TTS.   Goal of Therapy:  INR 2-2.5 Monitor platelets by anticoagulation protocol: Yes   Plan:  Hold warfarin tonight Monitor INR, CBC, and for s/sx of bleeding   Sherron Monday, PharmD, BCCCP Clinical Pharmacist  Phone: [phone]  Please check AMION for all [facility] Pharmacy phone numbers After 10:00 PM, call Main Pharmacy [phone] 01/25/2020,2:17 PM

## 2020-01-25 NOTE — Progress Notes (Signed)
CRITICAL VALUE ALERT  Critical Value:  ABG pH 7.625  Date & Time Notied:  01/25/20 at 2202  Provider Notified: Thamas Jaegers  Orders Received/Actions taken: Awaiting orders

## 2020-01-26 ENCOUNTER — Ambulatory Visit (HOSPITAL_COMMUNITY): Payer: No Typology Code available for payment source

## 2020-01-26 ENCOUNTER — Encounter (HOSPITAL_COMMUNITY): Payer: No Typology Code available for payment source

## 2020-01-26 ENCOUNTER — Inpatient Hospital Stay (HOSPITAL_COMMUNITY): Payer: No Typology Code available for payment source

## 2020-01-26 ENCOUNTER — Encounter (HOSPITAL_COMMUNITY)
Admission: AD | Disposition: A | Discharge status: Home Health | Payer: Self-pay | Source: Ambulatory Visit | Attending: Cardiology

## 2020-01-26 ENCOUNTER — Inpatient Hospital Stay (HOSPITAL_COMMUNITY): Payer: No Typology Code available for payment source | Admitting: Certified Registered Nurse Anesthetist

## 2020-01-26 ENCOUNTER — Inpatient Hospital Stay (HOSPITAL_COMMUNITY): Admission: RE | Admit: 2020-01-26 | Payer: Medicare HMO | Source: Home / Self Care | Admitting: Cardiothoracic Surgery

## 2020-01-26 DIAGNOSIS — T829XXD Unspecified complication of cardiac and vascular prosthetic device, implant and graft, subsequent encounter: Secondary | ICD-10-CM

## 2020-01-26 DIAGNOSIS — T8141XA Infection following a procedure, superficial incisional surgical site, initial encounter: Secondary | ICD-10-CM

## 2020-01-26 DIAGNOSIS — T8142XA Infection following a procedure, deep incisional surgical site, initial encounter: Secondary | ICD-10-CM

## 2020-01-26 DIAGNOSIS — I5022 Chronic systolic (congestive) heart failure: Secondary | ICD-10-CM

## 2020-01-26 HISTORY — PX: APPLICATION OF WOUND VAC: SHX5189

## 2020-01-26 HISTORY — PX: STERNAL WOUND DEBRIDEMENT: SHX1058

## 2020-01-26 LAB — BPAM FFP
Blood Product Expiration Date: 202103142359
Blood Product Expiration Date: 202103142359
Blood Product Expiration Date: 202103142359
ISSUE DATE / TIME: 202103091735
ISSUE DATE / TIME: 202103092106
ISSUE DATE / TIME: 202103092318
Unit Type and Rh: 6200
Unit Type and Rh: 6200
Unit Type and Rh: 6200

## 2020-01-26 LAB — ECHOCARDIOGRAM COMPLETE
Height: 71 in
Weight: 2797.2 oz

## 2020-01-26 LAB — PREPARE FRESH FROZEN PLASMA
Unit division: 0
Unit division: 0
Unit division: 0

## 2020-01-26 LAB — COMPREHENSIVE METABOLIC PANEL
ALT: 20 U/L (ref 0–44)
AST: 19 U/L (ref 15–41)
Albumin: 3 g/dL — ABNORMAL LOW (ref 3.5–5.0)
Alkaline Phosphatase: 92 U/L (ref 38–126)
Anion gap: 13 (ref 5–15)
BUN: <5 mg/dL — ABNORMAL LOW (ref 6–20)
CO2: 24 mmol/L (ref 22–32)
Calcium: 9.5 mg/dL (ref 8.9–10.3)
Chloride: 102 mmol/L (ref 98–111)
Creatinine, Ser: 1.11 mg/dL (ref 0.61–1.24)
GFR calc Af Amer: >60 mL/min (ref >60)
GFR calc non Af Amer: >60 mL/min (ref >60)
Glucose, Bld: 152 mg/dL — ABNORMAL HIGH (ref 70–99)
Potassium: 3.3 mmol/L — ABNORMAL LOW (ref 3.5–5.1)
Sodium: 139 mmol/L (ref 135–145)
Total Bilirubin: 1.5 mg/dL — ABNORMAL HIGH (ref 0.3–1.2)
Total Protein: 7.3 g/dL (ref 6.5–8.1)

## 2020-01-26 LAB — CBC
HCT: 40.3 % (ref 39.0–52.0)
Hemoglobin: 13.7 g/dL (ref 13.0–17.0)
MCH: 30.1 pg (ref 26.0–34.0)
MCHC: 34 g/dL (ref 30.0–36.0)
MCV: 88.6 fL (ref 80.0–100.0)
Platelets: 355 10*3/uL (ref 150–400)
RBC: 4.55 MIL/uL (ref 4.22–5.81)
RDW: 12.3 % (ref 11.5–15.5)
WBC: 20.9 10*3/uL — ABNORMAL HIGH (ref 4.0–10.5)
nRBC: 0 % (ref 0.0–0.2)

## 2020-01-26 LAB — HIV ANTIBODY (ROUTINE TESTING W REFLEX): HIV Screen 4th Generation wRfx: NONREACTIVE

## 2020-01-26 LAB — HEMOGLOBIN A1C
Hgb A1c MFr Bld: 5.5 % (ref 4.8–5.6)
Mean Plasma Glucose: 111.15 mg/dL

## 2020-01-26 LAB — SARS CORONAVIRUS 2 (TAT 6-24 HRS): SARS Coronavirus 2: NEGATIVE

## 2020-01-26 LAB — LACTATE DEHYDROGENASE: LDH: 193 U/L — ABNORMAL HIGH (ref 98–192)

## 2020-01-26 LAB — PROTIME-INR
INR: 2.2 — ABNORMAL HIGH (ref 0.8–1.2)
Prothrombin Time: 23.9 seconds — ABNORMAL HIGH (ref 11.4–15.2)

## 2020-01-26 LAB — GLUCOSE, CAPILLARY
Glucose-Capillary: 129 mg/dL — ABNORMAL HIGH (ref 70–99)
Glucose-Capillary: 108 mg/dL — ABNORMAL HIGH (ref 70–99)
Glucose-Capillary: 95 mg/dL (ref 70–99)

## 2020-01-26 SURGERY — DEBRIDEMENT, WOUND, STERNUM
Anesthesia: General

## 2020-01-26 MED ORDER — LACTATED RINGERS IV SOLN: INTRAVENOUS | Status: DC | PRN

## 2020-01-26 MED ORDER — TRAMADOL HCL 50 MG PO TABS
50.0000 mg | ORAL_TABLET | Freq: Four times a day (QID) | ORAL | Status: DC | PRN
  Administered 2020-02-04 – 2020-02-06 (×4): 100 mg via ORAL
  Filled 2020-01-26 (×4): qty 2

## 2020-01-26 MED ORDER — ONDANSETRON HCL 4 MG/2ML IJ SOLN: 4.0000 mg | Freq: Four times a day (QID) | INTRAMUSCULAR | Status: DC | PRN

## 2020-01-26 MED ORDER — SODIUM CHLORIDE 0.9 % IR SOLN
Status: DC | PRN
  Administered 2020-01-26: 1000 mL

## 2020-01-26 MED ORDER — OXYCODONE HCL 5 MG PO TABS
5.0000 mg | ORAL_TABLET | ORAL | Status: DC | PRN
  Administered 2020-01-28 – 2020-02-09 (×23): 10 mg via ORAL
  Filled 2020-01-26 (×23): qty 2

## 2020-01-26 MED ORDER — FENTANYL CITRATE (PF) 100 MCG/2ML IJ SOLN
25.0000 ug | INTRAMUSCULAR | Status: DC | PRN
  Administered 2020-01-26 – 2020-01-27 (×4): 50 ug via INTRAVENOUS
  Administered 2020-01-28: 25 ug via INTRAVENOUS
  Administered 2020-01-28 – 2020-02-08 (×6): 50 ug via INTRAVENOUS
  Filled 2020-01-26 (×12): qty 2

## 2020-01-26 MED ORDER — SODIUM CHLORIDE 0.9 % IV SOLN
20.0000 ug | Freq: Once | INTRAVENOUS | Status: AC
  Administered 2020-01-26: 20 ug via INTRAVENOUS
  Filled 2020-01-26: qty 5

## 2020-01-26 MED ORDER — ONDANSETRON HCL 4 MG/2ML IJ SOLN
INTRAMUSCULAR | Status: DC | PRN
  Administered 2020-01-26: 4 mg via INTRAVENOUS

## 2020-01-26 MED ORDER — IPRATROPIUM-ALBUTEROL 0.5-2.5 (3) MG/3ML IN SOLN: 3.0000 mL | RESPIRATORY_TRACT | Status: DC | PRN

## 2020-01-26 MED ORDER — SODIUM CHLORIDE 0.9% IV SOLUTION: Freq: Once | INTRAVENOUS | Status: DC

## 2020-01-26 MED ORDER — ALBUMIN HUMAN 5 % IV SOLN: INTRAVENOUS | Status: DC | PRN

## 2020-01-26 MED ORDER — ROCURONIUM BROMIDE 10 MG/ML (PF) SYRINGE
PREFILLED_SYRINGE | INTRAVENOUS | Status: DC | PRN
  Administered 2020-01-26: 20 mg via INTRAVENOUS
  Administered 2020-01-26: 50 mg via INTRAVENOUS

## 2020-01-26 MED ORDER — ZINC SULFATE 220 (50 ZN) MG PO CAPS: 220.0000 mg | ORAL_CAPSULE | Freq: Every day | ORAL | Status: DC

## 2020-01-26 MED ORDER — ACETAMINOPHEN 500 MG PO TABS
1000.0000 mg | ORAL_TABLET | Freq: Four times a day (QID) | ORAL | Status: AC
  Administered 2020-01-26 – 2020-01-31 (×13): 1000 mg via ORAL
  Filled 2020-01-26 (×14): qty 2

## 2020-01-26 MED ORDER — ACETAMINOPHEN 160 MG/5ML PO SOLN: 1000.0000 mg | Freq: Four times a day (QID) | ORAL | Status: AC

## 2020-01-26 MED ORDER — FLUTICASONE PROPIONATE 50 MCG/ACT NA SUSP
1.0000 | Freq: Every day | NASAL | Status: DC
  Administered 2020-01-27 – 2020-02-08 (×13): 1 via NASAL
  Filled 2020-01-26: qty 16

## 2020-01-26 MED ORDER — VANCOMYCIN HCL 1000 MG IV SOLR
INTRAVENOUS | Status: DC | PRN
  Administered 2020-01-26: 09:00:00 1000 mL

## 2020-01-26 MED ORDER — HEMOSTATIC AGENTS (NO CHARGE) OPTIME
TOPICAL | Status: DC | PRN
  Administered 2020-01-26 (×2): 1 via TOPICAL

## 2020-01-26 MED ORDER — ACETAMINOPHEN 10 MG/ML IV SOLN: 1000.0000 mg | Freq: Once | INTRAVENOUS | Status: DC | PRN

## 2020-01-26 MED ORDER — POTASSIUM CHLORIDE CRYS ER 20 MEQ PO TBCR
40.0000 meq | EXTENDED_RELEASE_TABLET | Freq: Once | ORAL | Status: AC
  Administered 2020-01-27: 40 meq via ORAL
  Filled 2020-01-26: qty 2

## 2020-01-26 MED ORDER — MIDAZOLAM HCL 2 MG/2ML IJ SOLN
INTRAMUSCULAR | Status: DC | PRN
  Administered 2020-01-26 (×2): 1 mg via INTRAVENOUS

## 2020-01-26 MED ORDER — FLUTICASONE PROPIONATE 50 MCG/ACT NA SUSP: 1.0000 | Freq: Every day | NASAL | Status: DC

## 2020-01-26 MED ORDER — FENTANYL CITRATE (PF) 100 MCG/2ML IJ SOLN: 25.0000 ug | INTRAMUSCULAR | Status: DC | PRN

## 2020-01-26 MED ORDER — BISACODYL 5 MG PO TBEC
10.0000 mg | DELAYED_RELEASE_TABLET | Freq: Every day | ORAL | Status: DC
  Administered 2020-01-26 – 2020-02-08 (×9): 10 mg via ORAL
  Filled 2020-01-26 (×12): qty 2

## 2020-01-26 MED ORDER — INSULIN ASPART 100 UNIT/ML ~~LOC~~ SOLN
0.0000 [IU] | Freq: Every day | SUBCUTANEOUS | Status: DC
  Administered 2020-01-31 – 2020-02-04 (×2): 2 [IU] via SUBCUTANEOUS

## 2020-01-26 MED ORDER — PROMETHAZINE HCL 25 MG/ML IJ SOLN: 6.2500 mg | INTRAMUSCULAR | Status: DC | PRN

## 2020-01-26 MED ORDER — SODIUM CHLORIDE 0.45 % IV SOLN: INTRAVENOUS | Status: DC

## 2020-01-26 MED ORDER — ALBUMIN HUMAN 5 % IV SOLN
12.5000 g | Freq: Once | INTRAVENOUS | Status: AC
  Administered 2020-01-26: 12.5 g via INTRAVENOUS
  Filled 2020-01-26 (×2): qty 250

## 2020-01-26 MED ORDER — INSULIN ASPART 100 UNIT/ML ~~LOC~~ SOLN
0.0000 [IU] | Freq: Three times a day (TID) | SUBCUTANEOUS | Status: DC
  Administered 2020-02-01: 17:00:00 2 [IU] via SUBCUTANEOUS

## 2020-01-26 MED ORDER — FENTANYL CITRATE (PF) 250 MCG/5ML IJ SOLN
INTRAMUSCULAR | Status: DC | PRN
  Administered 2020-01-26: 50 ug via INTRAVENOUS
  Administered 2020-01-26: 25 ug via INTRAVENOUS
  Administered 2020-01-26: 50 ug via INTRAVENOUS
  Administered 2020-01-26: 25 ug via INTRAVENOUS

## 2020-01-26 MED ORDER — ETOMIDATE 2 MG/ML IV SOLN
INTRAVENOUS | Status: DC | PRN
  Administered 2020-01-26: 16 mg via INTRAVENOUS
  Administered 2020-01-26: 4 mg via INTRAVENOUS

## 2020-01-26 MED ORDER — IPRATROPIUM-ALBUTEROL 0.5-2.5 (3) MG/3ML IN SOLN
3.0000 mL | Freq: Four times a day (QID) | RESPIRATORY_TRACT | Status: DC
  Administered 2020-01-26: 3 mL via RESPIRATORY_TRACT
  Filled 2020-01-26: qty 3

## 2020-01-26 MED ORDER — VANCOMYCIN HCL 1000 MG IV SOLR
INTRAVENOUS | Status: AC
  Filled 2020-01-26: qty 1000

## 2020-01-26 MED ORDER — PHENYLEPHRINE HCL-NACL 10-0.9 MG/250ML-% IV SOLN
INTRAVENOUS | Status: DC | PRN
  Administered 2020-01-26: 25 ug/min via INTRAVENOUS

## 2020-01-26 MED ORDER — SENNOSIDES-DOCUSATE SODIUM 8.6-50 MG PO TABS
1.0000 | ORAL_TABLET | Freq: Every day | ORAL | Status: DC
  Administered 2020-01-26 – 2020-02-08 (×10): 1 via ORAL
  Filled 2020-01-26 (×11): qty 1

## 2020-01-26 MED ORDER — SUGAMMADEX SODIUM 200 MG/2ML IV SOLN
INTRAVENOUS | Status: DC | PRN
  Administered 2020-01-26 (×2): 200 mg via INTRAVENOUS

## 2020-01-26 SURGICAL SUPPLY — 79 items
ANCHOR CATH FOLEY SECURE (MISCELLANEOUS) ×4 IMPLANT
ATTRACTOMAT 16X20 MAGNETIC DRP (DRAPES) ×1 IMPLANT
BAG DECANTER FOR FLEXI CONT (MISCELLANEOUS) ×1 IMPLANT
BANDAGE HEMOSTAT MRDH 4X4 STRL (MISCELLANEOUS) IMPLANT
BENZOIN TINCTURE PRP APPL 2/3 (GAUZE/BANDAGES/DRESSINGS) IMPLANT
BLADE CLIPPER SURG (BLADE) ×1 IMPLANT
BLADE SURG 10 STRL SS (BLADE) ×1 IMPLANT
BLADE SURG 15 STRL LF DISP TIS (BLADE) IMPLANT
BLADE SURG 15 STRL SS (BLADE)
BNDG GAUZE ELAST 4 BULKY (GAUZE/BANDAGES/DRESSINGS) IMPLANT
BNDG HEMOSTAT MRDH 4X4 STRL (MISCELLANEOUS) ×6
CANISTER SUCT 3000ML PPV (MISCELLANEOUS) ×3 IMPLANT
CANISTER WOUND CARE 500ML ATS (WOUND CARE) ×5 IMPLANT
CATH FOLEY 2WAY SLVR  5CC 16FR (CATHETERS)
CATH FOLEY 2WAY SLVR 5CC 16FR (CATHETERS) IMPLANT
CATH THORACIC 28FR RT ANG (CATHETERS) IMPLANT
CATH THORACIC 36FR (CATHETERS) IMPLANT
CLIP VESOCCLUDE SM WIDE 24/CT (CLIP) IMPLANT
CNTNR URN SCR LID CUP LEK RST (MISCELLANEOUS) IMPLANT
CONN Y 3/8X3/8X3/8  BEN (MISCELLANEOUS)
CONN Y 3/8X3/8X3/8 BEN (MISCELLANEOUS) IMPLANT
CONNECTOR Y ATS VAC SYSTEM (MISCELLANEOUS) ×2 IMPLANT
CONT SPEC 4OZ STRL OR WHT (MISCELLANEOUS) ×3
COVER SURGICAL LIGHT HANDLE (MISCELLANEOUS) ×2 IMPLANT
DRAPE LAPAROSCOPIC ABDOMINAL (DRAPES) ×3 IMPLANT
DRAPE SLUSH MACHINE 52X66 (DRAPES) ×2 IMPLANT
DRAPE SLUSH/WARMER DISC (DRAPES) IMPLANT
DRAPE WARM FLUID 44X44 (DRAPES) IMPLANT
DRSG AQUACEL AG ADV 3.5X14 (GAUZE/BANDAGES/DRESSINGS) ×3 IMPLANT
DRSG PAD ABDOMINAL 8X10 ST (GAUZE/BANDAGES/DRESSINGS) IMPLANT
DRSG TEGADERM 4X4.75 (GAUZE/BANDAGES/DRESSINGS) ×4 IMPLANT
DRSG VAC ATS LRG SENSATRAC (GAUZE/BANDAGES/DRESSINGS) ×1 IMPLANT
DRSG VAC ATS MED SENSATRAC (GAUZE/BANDAGES/DRESSINGS) ×3 IMPLANT
DRSG VAC ATS SM SENSATRAC (GAUZE/BANDAGES/DRESSINGS) ×1 IMPLANT
ELECT BLADE 4.0 EZ CLEAN MEGAD (MISCELLANEOUS) ×3
ELECT REM PT RETURN 9FT ADLT (ELECTROSURGICAL) ×3
ELECTRODE BLDE 4.0 EZ CLN MEGD (MISCELLANEOUS) IMPLANT
ELECTRODE REM PT RTRN 9FT ADLT (ELECTROSURGICAL) ×1 IMPLANT
GAUZE SPONGE 4X4 12PLY STRL (GAUZE/BANDAGES/DRESSINGS) ×3 IMPLANT
GAUZE XEROFORM 5X9 LF (GAUZE/BANDAGES/DRESSINGS) IMPLANT
GLOVE BIO SURGEON STRL SZ7.5 (GLOVE) ×6 IMPLANT
GLOVE BIOGEL PI IND STRL 6 (GLOVE) IMPLANT
GLOVE BIOGEL PI IND STRL 6.5 (GLOVE) IMPLANT
GLOVE BIOGEL PI INDICATOR 6 (GLOVE) ×4
GLOVE BIOGEL PI INDICATOR 6.5 (GLOVE) ×8
GOWN STRL REUS W/ TWL LRG LVL3 (GOWN DISPOSABLE) ×4 IMPLANT
GOWN STRL REUS W/TWL LRG LVL3 (GOWN DISPOSABLE) ×12
HANDPIECE INTERPULSE COAX TIP (DISPOSABLE)
HEMOSTAT POWDER SURGIFOAM 1G (HEMOSTASIS) IMPLANT
HEMOSTAT SURGICEL 2X14 (HEMOSTASIS) IMPLANT
KIT BASIN OR (CUSTOM PROCEDURE TRAY) ×3 IMPLANT
KIT SUCTION CATH 14FR (SUCTIONS) IMPLANT
KIT TURNOVER KIT B (KITS) ×3 IMPLANT
NS IRRIG 1000ML POUR BTL (IV SOLUTION) ×3 IMPLANT
PACK CHEST (CUSTOM PROCEDURE TRAY) ×3 IMPLANT
PACK GENERAL/GYN (CUSTOM PROCEDURE TRAY) ×1 IMPLANT
PAD ARMBOARD 7.5X6 YLW CONV (MISCELLANEOUS) ×6 IMPLANT
PAD NEG PRESSURE SENSATRAC (MISCELLANEOUS) ×2 IMPLANT
SET HNDPC FAN SPRY TIP SCT (DISPOSABLE) ×1 IMPLANT
SET INTERPULSE LAVAGE W/TIP (ORTHOPEDIC DISPOSABLE SUPPLIES) ×2 IMPLANT
SOL PREP POV-IOD 4OZ 10% (MISCELLANEOUS) IMPLANT
SPONGE LAP 18X18 RF (DISPOSABLE) ×3 IMPLANT
SPONGE LAP 4X18 RFD (DISPOSABLE) ×4 IMPLANT
STAPLER VISISTAT 35W (STAPLE) IMPLANT
SUT ETHILON 3 0 FSL (SUTURE) IMPLANT
SUT STEEL 6MS V (SUTURE) IMPLANT
SUT STEEL STERNAL CCS#1 18IN (SUTURE) IMPLANT
SUT STEEL SZ 6 DBL 3X14 BALL (SUTURE) IMPLANT
SUT VIC AB 1 CTX 36 (SUTURE)
SUT VIC AB 1 CTX36XBRD ANBCTR (SUTURE) ×2 IMPLANT
SUT VIC AB 2-0 CTX 27 (SUTURE) ×2 IMPLANT
SUT VIC AB 3-0 X1 27 (SUTURE) ×2 IMPLANT
SWAB COLLECTION DEVICE MRSA (MISCELLANEOUS) ×2 IMPLANT
SWAB CULTURE ESWAB REG 1ML (MISCELLANEOUS) IMPLANT
SYR 5ML LL (SYRINGE) IMPLANT
TOWEL GREEN STERILE (TOWEL DISPOSABLE) ×3 IMPLANT
TOWEL GREEN STERILE FF (TOWEL DISPOSABLE) ×3 IMPLANT
TRAY FOLEY MTR SLVR 16FR STAT (SET/KITS/TRAYS/PACK) IMPLANT
WATER STERILE IRR 1000ML POUR (IV SOLUTION) ×3 IMPLANT

## 2020-01-26 NOTE — Progress Notes (Signed)
  Echocardiogram 2D Echocardiogram has been performed.  Theodore Welch 01/26/2020, 3:11 PM

## 2020-01-26 NOTE — Anesthesia Procedure Notes (Signed)
Central Venous Catheter Insertion Performed by: Eilene Ghazi, MD, anesthesiologist Start/End3/08/2020 9:35 AM, 01/26/2020 9:50 AM Patient location: Pre-op. Preanesthetic checklist: patient identified, IV checked, site marked, risks and benefits discussed, surgical consent, monitors and equipment checked, pre-op evaluation, timeout performed and anesthesia consent Position: Trendelenburg Lidocaine 1% used for infiltration and patient sedated Hand hygiene performed  and maximum sterile barriers used  Catheter size: 8 Fr Total catheter length 16. Central line was placed.Double lumen Procedure performed using ultrasound guided technique. Ultrasound Notes:anatomy identified, needle tip was noted to be adjacent to the nerve/plexus identified, no ultrasound evidence of intravascular and/or intraneural injection and image(s) printed for medical record Attempts: 1 Following insertion, dressing applied, line sutured and Biopatch. Post procedure assessment: blood return through all ports  Patient tolerated the procedure well with no immediate complications.

## 2020-01-26 NOTE — Plan of Care (Signed)
  Problem: Education: Goal: Knowledge of General Education information will improve Description Including pain rating scale, medication(s)/side effects and non-pharmacologic comfort measures Outcome: Progressing   

## 2020-01-26 NOTE — Transfer of Care (Signed)
Immediate Anesthesia Transfer of Care Note  Patient: Theodore Welch  Procedure(s) Performed: STERNAL WOUND & driveline IRRIGATION AND DEBRIDEMENT (N/A ) APPLICATION OF WOUND VAC (N/A )  Patient Location: PACU  Anesthesia Type:General  Level of Consciousness: awake, alert  and oriented  Airway & Oxygen Therapy: Patient Spontanous Breathing  Post-op Assessment: Report given to RN and Post -op Vital signs reviewed and stable  Post vital signs: Reviewed and stable  Last Vitals:  Vitals Value Taken Time  BP 101/86 01/26/20 1036  Temp    Pulse 87 01/26/20 1040  Resp 25 01/26/20 1040  SpO2 96 % 01/26/20 1040  Vitals shown include unvalidated device data.  Last Pain:  Vitals:   01/26/20 0735  TempSrc:   PainSc: 2          Complications: No apparent anesthesia complications

## 2020-01-26 NOTE — Anesthesia Procedure Notes (Signed)
Procedure Name: Intubation Date/Time: 01/26/2020 8:44 AM Performed by: Valda Favia, CRNA Pre-anesthesia Checklist: Patient identified, Emergency Drugs available, Suction available and Patient being monitored Patient Re-evaluated:Patient Re-evaluated prior to induction Oxygen Delivery Method: Circle System Utilized Preoxygenation: Pre-oxygenation with 100% oxygen Induction Type: IV induction and Rapid sequence Laryngoscope Size: Mac and 4 Grade View: Grade I Tube type: Oral Tube size: 7.5 mm Number of attempts: 1 Airway Equipment and Method: Stylet and Oral airway Placement Confirmation: ETT inserted through vocal cords under direct vision,  positive ETCO2 and breath sounds checked- equal and bilateral Secured at: 21 cm Tube secured with: Tape Dental Injury: Teeth and Oropharynx as per pre-operative assessment

## 2020-01-26 NOTE — Anesthesia Preprocedure Evaluation (Signed)
Anesthesia Evaluation  Patient identified by MRN, date of birth, ID band Patient awake    Reviewed: Allergy & Precautions, NPO status , Patient's Chart, lab work & pertinent test results  Airway Mallampati: II  TM Distance: >3 FB Neck ROM: Full    Dental no notable dental hx.    Pulmonary neg pulmonary ROS, Current Smoker,    Pulmonary exam normal breath sounds clear to auscultation       Cardiovascular hypertension, +CHF  Normal cardiovascular exam Rhythm:Regular Rate:Normal  LVAD present   Neuro/Psych negative neurological ROS  negative psych ROS   GI/Hepatic negative GI ROS, Neg liver ROS,   Endo/Other  diabetes  Renal/GU negative Renal ROS  negative genitourinary   Musculoskeletal negative musculoskeletal ROS (+)   Abdominal   Peds negative pediatric ROS (+)  Hematology negative hematology ROS (+)   Anesthesia Other Findings   Reproductive/Obstetrics negative OB ROS                             Anesthesia Physical Anesthesia Plan  ASA: IV  Anesthesia Plan: General   Post-op Pain Management:    Induction: Intravenous  PONV Risk Score and Plan: 1 and Ondansetron, Dexamethasone and Treatment may vary due to age or medical condition  Airway Management Planned: Oral ETT  Additional Equipment:   Intra-op Plan:   Post-operative Plan: Possible Post-op intubation/ventilation  Informed Consent: I have reviewed the patients History and Physical, chart, labs and discussed the procedure including the risks, benefits and alternatives for the proposed anesthesia with the patient or authorized representative who has indicated his/her understanding and acceptance.     Dental advisory given  Plan Discussed with: CRNA and Surgeon  Anesthesia Plan Comments:         Anesthesia Quick Evaluation

## 2020-01-26 NOTE — Progress Notes (Signed)
LVAD Coordinator Rounding Note:  Admitted 01/25/20 per Dr. Shirlee Latch with new onset sternal abscess.   HM III LVAD implanted on 08/03/18 by Dr. Dr. Laneta Simmers under Destination Therapy criteria due to smoking status.   Vital signs: Temp: 99.5 HR: 89 Doppler Pressure: 84 Automatic BP:  96/70 (78) O2 Sat: 95% RA Wt: 187.4>174.8 lbs   LVAD interrogation reveals:  Speed: 5700 Flow: 4.9 Power:  4.1 PI: 3.2 Alarms: none  Events:  > 65 PI events on 01/25/20 Hematocrit: 40  Fixed speed:  5700 Low speed limit: 5400  Drive Line:  Wound vac in place post surgery today; anchor intact and accurately applied  Labs:  LDH trend: 163>193  INR trend: 3.2>2.2  Anticoagulation Plan: -INR Goal: 2.0 - 2.5 -ASA Dose: 81 mg daily  Device: N/A  Infection:  - 01/25/20 BCs>>pending - 01/25/20 sternal wound culture>>gm positive cocci - 01/26/20 intra-op sternal wound cultures>>pending  Plan/Recommendations:  1. Call VAD Coordinator if any VAD or drive line issues.  Hessie Diener RN, VAD Coordinator [phone]

## 2020-01-26 NOTE — Progress Notes (Signed)
VAD Coordinator Procedure Note:   VAD Coordinator met patient in 2C02. Pt undergoing sternal wound and drive line debridement per Dr. Darcey Nora.  Hemodynamics and VAD parameters monitored by myself and anesthesia throughout the procedure. Blood pressures were obtained with automatic cuff on left arm and correlated with Doppler modified systolic.     Time: Doppler Auto  BP Flow PI Power Speed  Pre-procedure:            7:45 84 90/51 (62) 4.9 3.2 4.4 5700    8:30  112/80 (90) 4.9 3.3 4.6   Secation Induction:           8:45  105/94 (99) 4.8 3.6 4.5     9:00  87/62 (74) 4.6 7.4 4.8     9:15  75/57 (64) 4.9 4.1 4.6     9:30  97/86 (92) 4.7 4.1 4.6     9:45  80/39 (47) 5.1 2.8 4.4     10:00  114/44 (60) 5.2 3.1 4.3     10:15  112/50 (64) 5.0 1.9 4.5                     Recovery Area:           10:30  101/86 (92) 4.7 3.0 4.4     10:45  92/77 (84) 4.7 2.7 4.5     Patient tolerated the procedure well. VAD Coordinator accompanied and remained with patient in recovery area.   Dr. Darcey Nora in to speak with patient; he will also update [name]. Pt verbalized understanding of same.   Patient Disposition: [place] RN, [place] Coordinator [phone]

## 2020-01-26 NOTE — Anesthesia Postprocedure Evaluation (Signed)
Anesthesia Post Note  Patient: JESSEN SIEGMAN  Procedure(s) Performed: STERNAL WOUND & driveline IRRIGATION AND DEBRIDEMENT (N/A ) APPLICATION OF WOUND VAC (N/A )     Patient location during evaluation: PACU Anesthesia Type: General Level of consciousness: awake and alert Pain management: pain level controlled Vital Signs Assessment: post-procedure vital signs reviewed and stable Respiratory status: spontaneous breathing, nonlabored ventilation, respiratory function stable and patient connected to nasal cannula oxygen Cardiovascular status: blood pressure returned to baseline and stable Postop Assessment: no apparent nausea or vomiting Anesthetic complications: no    Last Vitals:  Vitals:   01/26/20 1101 01/26/20 1102  BP:    Pulse: 89   Resp: (!) 22   Temp:  37.5 C  SpO2: 99%     Last Pain:  Vitals:   01/26/20 1102  TempSrc:   PainSc: 0-No pain                 Arren Laminack S

## 2020-01-26 NOTE — Progress Notes (Signed)
Patient via bed to OR for debridement.  [place] LVAD coordinator accompanied. Patient on battery power.  Wall unit taken to OR with patient.  Wife [name] at bedside.

## 2020-01-26 NOTE — Progress Notes (Signed)
Pre Procedure note for inpatients:   Theodore Welch has been scheduled for Procedure(s): STERNAL WOUND IRRIGATION AND DEBRIDEMENT (N/A) APPLICATION OF WOUND VAC (N/A) today. The various methods of treatment have been discussed with the patient. After consideration of the risks, benefits and treatment options the patient has consented to the planned procedure.   The patient has been seen and labs reviewed. There are no changes in the patient's condition to prevent proceeding with the planned procedure today.  Recent labs:  Lab Results  Component Value Date   WBC 20.9 (H) 01/26/2020   HGB 13.7 01/26/2020   HCT 40.3 01/26/2020   PLT 355 01/26/2020   GLUCOSE 152 (H) 01/26/2020   CHOL 97 07/21/2018   TRIG 39 07/21/2018   HDL 32 (L) 07/21/2018   LDLCALC 57 07/21/2018   ALT 20 01/26/2020   AST 19 01/26/2020   NA 139 01/26/2020   K 3.3 (L) 01/26/2020   CL 102 01/26/2020   CREATININE 1.11 01/26/2020   BUN <5 (L) 01/26/2020   CO2 24 01/26/2020   TSH 0.880 07/24/2018   INR 2.2 (H) 01/26/2020   HGBA1C 6.2 (H) 08/03/2018    [name], MD 01/26/2020 7:48 AM

## 2020-01-26 NOTE — Progress Notes (Signed)
Patient ID: Theodore Welch, male   DOB: 1961/03/27, 59 y.o.   MRN: [id number]     Advanced Heart Failure Rounding Note  PCP-Cardiologist: No primary care provider on file.   Subjective:    Slept ok overnight.  WBCs up to 21, Tm 100.1.  Abscess site less painful today.  Theodore Welch is on vancomycin/Zosyn.   CT abdomen: subxiphoid fluid collection with superficial and deep components, collection is along the course of the driveline. Cellulitis at surface, no evidence for sternal osteomyelitis.   LVAD: Heartmate 3. Flow 4.7 L/min, 5700 rpm, PI 3.2, power 4.5.  10 PI events.   Objective:   Weight Range: 79.3 kg Body mass index is 24.38 kg/m.   Vital Signs:   Temp:  [98.4 F (36.9 C)-100.1 F (37.8 C)] 98.4 F (36.9 C) (03/10 0445) Pulse Rate:  [91-113] 101 (03/10 0445) Resp:  [16-28] 20 (03/10 0503) BP: (82-131)/(74-110) 82/74 (03/10 0445) SpO2:  [95 %-100 %] 95 % (03/10 0445) Weight:  [79.3 kg-85 kg] 79.3 kg (03/10 0543)    Weight change: Filed Weights   01/25/20 2251 01/26/20 0543  Weight: 85 kg 79.3 kg    Intake/Output:   Intake/Output Summary (Last 24 hours) at 01/26/2020 0731 Last data filed at 01/26/2020 0321 Gross per 24 hour  Intake 1306.01 ml  Output 1600 ml  Net -293.99 ml      Physical Exam    General:  Well appearing. No resp difficulty HEENT: Normal Neck: Supple. JVP not elevated. Carotids 2+ bilat; no bruits. No lymphadenopathy or thyromegaly appreciated. Cor: PMI nondisplaced. Regular rate & rhythm. No rubs, gallops or murmurs. Lungs: Clear Abdomen: Soft, nontender, nondistended. No hepatosplenomegaly. No bruits or masses. Good bowel sounds. Extremities: No cyanosis, clubbing, rash, edema Neuro: Alert & orientedx3, cranial nerves grossly intact. moves all 4 extremities w/o difficulty. Affect pleasant Skin: Abscess/cellulitis dressed.   Telemetry   Sinus tachy 100s (personally reviewed)  Labs    CBC Recent Labs    01/25/20 0958 01/26/20 0442  WBC  11.6* 20.9*  HGB 14.5 13.7  HCT 42.0 40.3  MCV 90.9 88.6  PLT 381 355   Basic Metabolic Panel Recent Labs    75/30/05 0958 01/26/20 0442  NA 139 139  K 3.4* 3.3*  CL 99 102  CO2 27 24  GLUCOSE 126* 152*  BUN <5* <5*  CREATININE 0.93 1.11  CALCIUM 9.2 9.5   Liver Function Tests Recent Labs    01/25/20 0958 01/26/20 0442  AST 20 19  ALT 21 20  ALKPHOS 89 92  BILITOT 0.8 1.5*  PROT 7.3 7.3  ALBUMIN 2.9* 3.0*   No results for input(s): LIPASE, AMYLASE in the last 72 hours. Cardiac Enzymes No results for input(s): CKTOTAL, CKMB, CKMBINDEX, TROPONINI in the last 72 hours.  BNP: BNP (last 3 results) No results for input(s): BNP in the last 8760 hours.  ProBNP (last 3 results) No results for input(s): PROBNP in the last 8760 hours.   D-Dimer No results for input(s): DDIMER in the last 72 hours. Hemoglobin A1C No results for input(s): HGBA1C in the last 72 hours. Fasting Lipid Panel No results for input(s): CHOL, HDL, LDLCALC, TRIG, CHOLHDL, LDLDIRECT in the last 72 hours. Thyroid Function Tests No results for input(s): TSH, T4TOTAL, T3FREE, THYROIDAB in the last 72 hours.  Invalid input(s): FREET3  Other results:   Imaging    DG Chest 2 View  Result Date: 01/25/2020 CLINICAL DATA:  Sternal abscess. EXAM: CHEST - 2 VIEW COMPARISON:  CT immediately prior. Radiograph 04/21/2019 FINDINGS: Median sternotomy. Left ventricular assist device. Cardiomegaly is grossly similar to prior. No pulmonary edema, confluent airspace disease or pleural effusion. No pneumothorax. Mild soft tissue edema of the anterior chest wall. IMPRESSION: Stable cardiomegaly with left ventricular assist device in place. Subxiphoid fluid collection on CT is not demonstrated radiographically. Electronically Signed   By: Keith Rake M.D.   On: 01/25/2020 20:19   CT CHEST W CONTRAST  Result Date: 01/25/2020 CLINICAL DATA:  Soft tissue infection suspected, abdomen, no prior imaging LVAD  cellulitis; Soft tissue infection suspected, abdomen, no prior imaging LVAD cellulits Sternal abscess. EXAM: CT CHEST, ABDOMEN, AND PELVIS WITH CONTRAST TECHNIQUE: Multidetector CT imaging of the chest, abdomen and pelvis was performed following the standard protocol during bolus administration of intravenous contrast. CONTRAST:  14mL OMNIPAQUE IOHEXOL 300 MG/ML  SOLN COMPARISON:  Chest abdomen pelvis CT 08/31/2019 FINDINGS: CT CHEST FINDINGS Cardiovascular: Minor aortic atherosclerosis. Left ventricular assist device in place which causes regional streak artifact. Aortic conduit is patent. Cardiomegaly. No definite pericardial effusion, there is some stranding of the anterior pericardium. Coronary artery calcifications. Mediastinum/Nodes: Scattered mediastinal lymph nodes are prominent but all subcentimeter short axis. Few prominent left axillary lymph nodes, also subcentimeter. No hilar adenopathy. No esophageal wall thickening. Visualized thyroid nodule. Lungs/Pleura: Streaky opacities in the left lower lobe likely related to scarring. No acute airspace disease. Minimal apical emphysema. No pulmonary edema. No pleural fluid. Minimal mucus within the dependent trachea. Musculoskeletal: Drive line extends in the subxiphoid region in the subcutaneous tissues of the anterior abdomen just to the left of midline. Subcutaneous fluid collection just anterior to the driveline in the subxiphoid region measures 4.0 x 3.5 x 4.2 cm. This fluid collection appears contiguous with the deep fluid collection anterior inferior to the heart that measures approximately 3.1 x 5.4 x 3.6 cm, detailed evaluation partially obscured by streak artifact. Inflammatory fat stranding tracks to the anterior pericardium. Skin thickening and soft tissue edema superficial to the lower aspect of the median sternotomy. No discrete osseous erosions or periosteal thickening. Similar degenerative change in the spine. CT ABDOMEN PELVIS FINDINGS  Hepatobiliary: No focal hepatic lesion or intrahepatic fluid collection. Gallbladder physiologically distended, no calcified stone. No biliary dilatation. Pancreas: No ductal dilatation or inflammation. Spleen: Lobulated splenic contour with cleft posteriorly and small amount of adjacent perisplenic fluid. This measures simple fluid density. No focal splenic lesion. Adrenals/Urinary Tract: No adrenal nodule. No hydronephrosis or perinephric edema. Homogeneous renal enhancement with symmetric excretion on delayed phase imaging. Cyst in the upper right kidney is unchanged. Urinary bladder is partially distended without wall thickening. Stomach/Bowel: Stomach partially distended. Normal positioning of the ligament of Treitz. No bowel obstruction or inflammatory change. Fluid-filled minimally prominent loops of small bowel in the left mid abdomen and right lower quadrant are likely reactive, there is no associated bowel wall thickening. Normal appendix. Diverticulosis of the distal colon. No acute diverticulitis. Vascular/Lymphatic: Aortic atherosclerosis. No aortic aneurysm. The portal vein is patent. No enlarged lymph nodes in the abdomen or pelvis. Reproductive: Prostate is unremarkable. Other: Small amount of simple free fluid adjacent to the posterior spleen. No other ascites. No free air. Similar soft tissue density adjacent to the exiting portion of the drive wire in the left lower abdomen. No discrete subcutaneous fluid collection. Musculoskeletal: There are no acute or suspicious osseous abnormalities. Hemi transitional lumbosacral anatomy. Degenerative change in the lower lumbar spine. IMPRESSION: 1. Subxiphoid fluid collection with both superficial and deep components lateral likely  contiguous. Superficial component measures 4.0 x 3.5 x 4.2 cm, D component 3.1 x 5.4 x 3.6 cm. Collection is along the course of the drive wire from left ventricular assist device. Associated inflammatory changes in the  subcutaneous tissues in stranding in the anterior mediastinum. 2. Skin thickening and soft tissue edema superficial to the lower aspect of the median sternotomy consistent with cellulitis. No discrete osseous erosions or periosteal thickening to suggest osteomyelitis. 3. Small amount of simple free fluid adjacent to the spleen. 4. Colonic diverticulosis without diverticulitis. Aortic Atherosclerosis (ICD10-I70.0). Electronically Signed   By: Narda Rutherford M.D.   On: 01/25/2020 19:30   CT ABDOMEN W CONTRAST  Result Date: 01/25/2020 CLINICAL DATA:  Soft tissue infection suspected, abdomen, no prior imaging LVAD cellulitis; Soft tissue infection suspected, abdomen, no prior imaging LVAD cellulits Sternal abscess. EXAM: CT CHEST, ABDOMEN, AND PELVIS WITH CONTRAST TECHNIQUE: Multidetector CT imaging of the chest, abdomen and pelvis was performed following the standard protocol during bolus administration of intravenous contrast. CONTRAST:  OMNIPAQUE IOHEXOL 300 MG/ML  SOLN COMPARISON:  Chest abdomen pelvis CT 08/31/2019 FINDINGS: CT CHEST FINDINGS Cardiovascular: Minor aortic atherosclerosis. Left ventricular assist device in place which causes regional streak artifact. Aortic conduit is patent. Cardiomegaly. No definite pericardial effusion, there is some stranding of the anterior pericardium. Coronary artery calcifications. Mediastinum/Nodes: Scattered mediastinal lymph nodes are prominent but all subcentimeter short axis. Few prominent left axillary lymph nodes, also subcentimeter. No hilar adenopathy. No esophageal wall thickening. Visualized thyroid nodule. Lungs/Pleura: Streaky opacities in the left lower lobe likely related to scarring. No acute airspace disease. Minimal apical emphysema. No pulmonary edema. No pleural fluid. Minimal mucus within the dependent trachea. Musculoskeletal: Drive line extends in the subxiphoid region in the subcutaneous tissues of the anterior abdomen just to the left of  midline. Subcutaneous fluid collection just anterior to the driveline in the subxiphoid region measures 4.0 x 3.5 x 4.2 cm. This fluid collection appears contiguous with the deep fluid collection anterior inferior to the heart that measures approximately 3.1 x 5.4 x 3.6 cm, detailed evaluation partially obscured by streak artifact. Inflammatory fat stranding tracks to the anterior pericardium. Skin thickening and soft tissue edema superficial to the lower aspect of the median sternotomy. No discrete osseous erosions or periosteal thickening. Similar degenerative change in the spine. CT ABDOMEN PELVIS FINDINGS Hepatobiliary: No focal hepatic lesion or intrahepatic fluid collection. Gallbladder physiologically distended, no calcified stone. No biliary dilatation. Pancreas: No ductal dilatation or inflammation. Spleen: Lobulated splenic contour with cleft posteriorly and small amount of adjacent perisplenic fluid. This measures simple fluid density. No focal splenic lesion. Adrenals/Urinary Tract: No adrenal nodule. No hydronephrosis or perinephric edema. Homogeneous renal enhancement with symmetric excretion on delayed phase imaging. Cyst in the upper right kidney is unchanged. Urinary bladder is partially distended without wall thickening. Stomach/Bowel: Stomach partially distended. Normal positioning of the ligament of Treitz. No bowel obstruction or inflammatory change. Fluid-filled minimally prominent loops of small bowel in the left mid abdomen and right lower quadrant are likely reactive, there is no associated bowel wall thickening. Normal appendix. Diverticulosis of the distal colon. No acute diverticulitis. Vascular/Lymphatic: Aortic atherosclerosis. No aortic aneurysm. The portal vein is patent. No enlarged lymph nodes in the abdomen or pelvis. Reproductive: Prostate is unremarkable. Other: Small amount of simple free fluid adjacent to the posterior spleen. No other ascites. No free air. Similar soft tissue  density adjacent to the exiting portion of the drive wire in the left lower abdomen. No  discrete subcutaneous fluid collection. Musculoskeletal: There are no acute or suspicious osseous abnormalities. Hemi transitional lumbosacral anatomy. Degenerative change in the lower lumbar spine. IMPRESSION: 1. Subxiphoid fluid collection with both superficial and deep components lateral likely contiguous. Superficial component measures 4.0 x 3.5 x 4.2 cm, D component 3.1 x 5.4 x 3.6 cm. Collection is along the course of the drive wire from left ventricular assist device. Associated inflammatory changes in the subcutaneous tissues in stranding in the anterior mediastinum. 2. Skin thickening and soft tissue edema superficial to the lower aspect of the median sternotomy consistent with cellulitis. No discrete osseous erosions or periosteal thickening to suggest osteomyelitis. 3. Small amount of simple free fluid adjacent to the spleen. 4. Colonic diverticulosis without diverticulitis. Aortic Atherosclerosis (ICD10-I70.0). Electronically Signed   By: Narda Rutherford M.D.   On: 01/25/2020 19:30      Medications:     Scheduled Medications: . aspirin EC  81 mg Oral Daily  . atorvastatin  20 mg Oral Daily  . DULoxetine  60 mg Oral Daily  . ipratropium-albuterol  3 mL Nebulization QID  . pantoprazole  40 mg Oral Daily  . potassium chloride  40 mEq Oral Once  . sacubitril-valsartan  1 tablet Oral BID  . sodium chloride flush  3 mL Intravenous Q12H  . spironolactone  25 mg Oral Daily  . thiamine  100 mg Oral Daily  . vitamin B-12  1,000 mcg Oral Daily  . zinc sulfate  220 mg Oral Daily     Infusions: . sodium chloride    . sodium chloride 10 mL/hr at 01/26/20 0300  .  ceFAZolin (ANCEF) IV    . piperacillin-tazobactam (ZOSYN)  IV 12.5 mL/hr at 01/26/20 0300  . vancomycin Stopped (01/26/20 0244)     PRN Medications:  sodium chloride, sodium chloride, acetaminophen, docusate sodium, fluticasone,  hydrALAZINE, hydrOXYzine, methocarbamol, ondansetron (ZOFRAN) IV, polyethylene glycol, sodium chloride flush, zolpidem   Assessment/Plan   1. Subxiphoid abscess: With surrounding cellulitis.  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  CT abdomen shows subxiphoid fluid collection with superficial and deep components, collection is along the course of the driveline. Cellulitis at surface, no evidence for sternal osteomyelitis. Febrile to 100.1 overnight, WBCs 21. Blood cultures pending.  - Continue vancomycin/Zosyn.  - To OR today for abscess drainage.  2. 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  NYHA class I-II at baseline.   Theodore Welch does not look volume overloaded. MAP 78 this morning, was higher last night. - Theodore Welch does not need Lasix.  Encouraged hydration.    - Continue spironolactone 25 mg daily.  - Continue Entresto, dose decreased to 49/51 with infection but may need to increase again.    - Amlodipine held with infection and MAP 70s in office yesterday, may need to restart soon.  - Continue ASA 81 daily.  - Warfarin with goal INR 2-2.5.  No evidence for GI bleeding. Hgb today is stable. Warfarin held today for OR.  - Should be transplant candidate eventually if Theodore Welch can quit smoking.  Will make transplant clinic referral when Theodore Welch is off totally.  - Due for echo, will get this admission.  3. Smoking: Cutting back.   4. H/o RLE DVT: On warfarin for LVAD.  5. OSA: Continue CPAP.  6. Hyperlipidemia: Atorvastatin.  7. Type II diabetes: SSI while inpatient.    Length of Stay: 1  Marca Ancona, MD  01/26/2020,  7:31 AM  Advanced Heart Failure Team Pager [phone] (M-F; 7a - 4p)  Please contact [facility] for night-coverage after hours (4p -7a ) and weekends on [email or web address]

## 2020-01-26 NOTE — Brief Op Note (Addendum)
01/26/2020  10:40 AM  PATIENT:  Theodore Welch  59 y.o. male  PRE-OPERATIVE DIAGNOSIS:  STERNAL WOUND S/P VAD FROM 2019  POST-OPERATIVE DIAGNOSIS:  STERNAL WOUND S/P VAD FROM 2019  PROCEDURE:  Procedure(s): STERNAL WOUND & driveline IRRIGATION AND DEBRIDEMENT (N/A) APPLICATION OF WOUND VAC (N/A)   [ Sharp Debridemement]  Drive line exit site sharp debridement irrigation and wound VAC  SURGEON:  Surgeon(s) and Role:    Kerin Perna, MD - Primary  PHYSICIAN ASSISTANT:   ASSISTANTS: TO [place]  RNFA  ANESTHESIA:   general  EBL:100 cc BLOOD ADMINISTERED:none  DRAINS: none   LOCAL MEDICATIONS USED:  NONE  SPECIMEN:  Excision     Abscess tissue sent for cuiture  DISPOSITION OF SPECIMEN:  microbiology  COUNTS:  YES  TOURNIQUET:  * No tourniquets in log *  DICTATION: .Dragon Dictation  PLAN OF CARE: return to 2C  PATIENT DISPOSITION:  PACU - hemodynamically stable.   Delay start of Pharmacological VTE agent (>24hrs) due to surgical blood loss or risk of bleeding: yes

## 2020-01-26 NOTE — Op Note (Signed)
NAME: CASHIUS, GRANDSTAFF MEDICAL RECORD FU:[id number] ACCOUNT [id number] DATE OF BIRTH:09-13-1961 FACILITY: [facility] LOCATION: [facility] PHYSICIAN:Bijou Easler VAN TRIGT III, MD  OPERATIVE REPORT  DATE OF PROCEDURE:  01/26/2020  OPERATION: 1.  Excisional debridement, irrigation, and wound VAC placement on lower sternal wound from previous VAD placement, 2019. 2.  Excisional debridement, irrigation and wound VAC placement on exit site of LVAD power cord, which was implanted 2019.  SURGEON:  Kerin Perna, MD  ASSISTANT:  Caroline More, RNFA.  ANESTHESIA:  General by Dr. Eilene Ghazi.  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  DESCRIPTION OF PROCEDURE:  After informed consent was documented in the preoperative holding area and final issues addressed, the patient was brought directly to the operating room.  General anesthesia was induced.  The patient remained stable.  The  chest, neck and midabdomen were all prepped and draped as a sterile field.  The exit site of the power cord was included in the prepped sterile field.  A proper time-out was performed.  First, the abscess at the lower sternal incision was opened and necrotic tissue was sharply debrided.  This extended deep down into the substernal space.  No part of the VAD hardware was visualized, but the presence of the power cord as well as the  outflow vascular graft could be palpated.    Sharp debridement was carried out, as well  as irrigation with vancomycin irrigation.  A wound VAC was cut to the appropriate size and depth and placed in the wound and then subsequently connected to suction.  The power cord exit site at the left upper quadrant was opened approximately 5 cm.  This tissue was infected in the abdominal wall only.  It was sharply excised, irrigated, and hemostasis was achieved.  A separate wound VAC sponge and sterile sheet was  applied to the site.  Both wound VAC tubings were connected to a Y connector to the wound VAC pump at minus 125 mmHg suction.  Final dimensions of the substernal incision were 5 cm long x 4 cm deep x 3 cm wide.  The power cord exit site wound measured 5 cm long x 2 cm deep x 3 cm wide.  VN/NUANCE  D:01/26/2020 T:01/26/2020 JOB:[id number]

## 2020-01-26 NOTE — Anesthesia Procedure Notes (Signed)
Anesthesia Procedure Image    

## 2020-01-26 NOTE — Progress Notes (Signed)
ANTICOAGULATION CONSULT NOTE - Initial Consult  Pharmacy Consult for warfarin Indication: LVAD  Allergies  Allergen Reactions  . Chlorhexidine Rash  . Other Rash    Prep pads    Patient Measurements: Height: 5\' 11"  (180.3 cm) Weight: 174 lb 13.2 oz (79.3 kg) IBW/kg (Calculated) : 75.3 Heparin Dosing Weight: 85 kg  Vital Signs: Temp: 99.5 F (37.5 C) (03/10 1135) Temp Source: Oral (03/10 1135) BP: 89/74 (03/10 1135) Pulse Rate: 89 (03/10 1135)  Labs: Recent Labs    01/25/20 0958 01/26/20 0442  HGB 14.5 13.7  HCT 42.0 40.3  PLT 381 355  LABPROT 32.6* 23.9*  INR 3.2* 2.2*  CREATININE 0.93 1.11    Estimated Creatinine Clearance: 75.4 mL/min (by C-G formula based on SCr of 1.11 mg/dL).   Medical History: Past Medical History:  Diagnosis Date  . 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 Cardiomyopathy, unspecified (HCC)   . CHF (congestive heart failure) (HCC)   . Chronic back pain   . Diabetes mellitus without complication (HCC)   . Enlarged heart   . Gastric ulcer   . Gastroenteritis   . H/O degenerative disc disease   . Hypertension   . LVAD (left ventricular assist device) present [facility])     Assessment: 58yom presenting with concern of large sternal abscess- starting on antibiotics. On warfarin PTA for hx LVAD (goal 2-2.5).   INR today came back therapeutic at 2.2 - off warfarin. Hgb 13, plt 355. LDH 193. No s/sx of bleeding.   PTA regimen is warfarin 5 mg daily except 7.5 mg TTS.   Goal of Therapy:  INR 2-2.5 Monitor platelets by anticoagulation protocol: Yes   Plan:  Hold warfarin tonight Will start heparin infusion once INR<1.8 Monitor INR, CBC, and for s/sx of bleeding   [facility], PharmD, BCCCP Clinical Pharmacist  Phone: [phone]  Please check AMION for all [facility] Pharmacy phone numbers After 10:00 PM, call Main Pharmacy [phone] 01/26/2020,1:40 PM

## 2020-01-27 ENCOUNTER — Inpatient Hospital Stay (HOSPITAL_COMMUNITY): Payer: No Typology Code available for payment source

## 2020-01-27 DIAGNOSIS — L02213 Cutaneous abscess of chest wall: Secondary | ICD-10-CM

## 2020-01-27 DIAGNOSIS — A4901 Methicillin susceptible Staphylococcus aureus infection, unspecified site: Secondary | ICD-10-CM

## 2020-01-27 LAB — CBC
HCT: 32.9 % — ABNORMAL LOW (ref 39.0–52.0)
Hemoglobin: 11.1 g/dL — ABNORMAL LOW (ref 13.0–17.0)
MCH: 30.4 pg (ref 26.0–34.0)
MCHC: 33.7 g/dL (ref 30.0–36.0)
MCV: 90.1 fL (ref 80.0–100.0)
Platelets: 300 10*3/uL (ref 150–400)
RBC: 3.65 MIL/uL — ABNORMAL LOW (ref 4.22–5.81)
RDW: 12.5 % (ref 11.5–15.5)
WBC: 15.9 10*3/uL — ABNORMAL HIGH (ref 4.0–10.5)
nRBC: 0 % (ref 0.0–0.2)

## 2020-01-27 LAB — GLUCOSE, CAPILLARY
Glucose-Capillary: 90 mg/dL (ref 70–99)
Glucose-Capillary: 98 mg/dL (ref 70–99)
Glucose-Capillary: 97 mg/dL (ref 70–99)
Glucose-Capillary: 121 mg/dL — ABNORMAL HIGH (ref 70–99)

## 2020-01-27 LAB — BASIC METABOLIC PANEL
Anion gap: 11 (ref 5–15)
BUN: 7 mg/dL (ref 6–20)
CO2: 24 mmol/L (ref 22–32)
Calcium: 9 mg/dL (ref 8.9–10.3)
Chloride: 103 mmol/L (ref 98–111)
Creatinine, Ser: 0.89 mg/dL (ref 0.61–1.24)
GFR calc Af Amer: >60 mL/min (ref >60)
GFR calc non Af Amer: >60 mL/min (ref >60)
Glucose, Bld: 104 mg/dL — ABNORMAL HIGH (ref 70–99)
Potassium: 3.2 mmol/L — ABNORMAL LOW (ref 3.5–5.1)
Sodium: 138 mmol/L (ref 135–145)

## 2020-01-27 LAB — ACID FAST SMEAR (AFB, MYCOBACTERIA): Acid Fast Smear: NEGATIVE

## 2020-01-27 LAB — PROTIME-INR
INR: 2.7 — ABNORMAL HIGH (ref 0.8–1.2)
Prothrombin Time: 29 seconds — ABNORMAL HIGH (ref 11.4–15.2)

## 2020-01-27 LAB — LACTATE DEHYDROGENASE: LDH: 159 U/L (ref 98–192)

## 2020-01-27 LAB — VANCOMYCIN, TROUGH: Vancomycin Tr: 10 ug/mL — ABNORMAL LOW (ref 15–20)

## 2020-01-27 MED ORDER — POTASSIUM CHLORIDE CRYS ER 20 MEQ PO TBCR
40.0000 meq | EXTENDED_RELEASE_TABLET | Freq: Every day | ORAL | Status: DC
  Administered 2020-01-27 – 2020-01-31 (×5): 40 meq via ORAL
  Filled 2020-01-27 (×5): qty 2

## 2020-01-27 NOTE — Progress Notes (Addendum)
Patient ID: Theodore Welch, male   DOB: 08-Oct-1961, 59 y.o.   MRN: [id number]     Advanced Heart Failure Rounding Note  PCP-Cardiologist: No primary care provider on file.   Subjective:    S/P I&D with VAC placement x 2 --> 150 CC out    WBC trending down 21>16.   CT abdomen: subxiphoid fluid collection with superficial and deep components, collection is along the course of the driveline. Cellulitis at surface, no evidence for sternal osteomyelitis.   LVAD: Heartmate 3. Flow 4.9 L/min, 5700 rpm, PI 2.9, power 5.  10 PI events.   Objective:   Weight Range: 80.8 kg Body mass index is 24.84 kg/m.   Vital Signs:   Temp:  [98.5 F (36.9 C)-100.7 F (38.2 C)] 98.7 F (37.1 C) (03/11 0355) Pulse Rate:  [79-99] 83 (03/11 0355) Resp:  [18-33] 18 (03/11 0355) BP: (84-101)/(55-86) 97/86 (03/11 0355) SpO2:  [94 %-100 %] 100 % (03/11 0355) Weight:  [80.8 kg] 80.8 kg (03/11 0355)    Weight change: Filed Weights   01/25/20 2251 01/26/20 0543 01/27/20 0355  Weight: 85 kg 79.3 kg 80.8 kg    Intake/Output:   Intake/Output Summary (Last 24 hours) at 01/27/2020 0714 Last data filed at 01/26/2020 2329 Gross per 24 hour  Intake 2130 ml  Output 900 ml  Net 1230 ml      Physical Exam    Physical Exam: GENERAL: no acute distress. HEENT: normal  NECK: Supple, JVP 5-6 .  2+ bilaterally, no bruits.  No lymphadenopathy or thyromegaly appreciated.  RIJ CARDIAC:  Mechanical heart sounds with LVAD hum present. Sternal Vac  LUNGS:  Clear to auscultation bilaterally.  ABDOMEN:  Soft, round, nontender, positive bowel sounds x4.     LVAD exit site: VAC dressing in place. EXTREMITIES:  Warm and dry, no cyanosis, clubbing, rash or edema  NEUROLOGIC:  Alert and oriented x 4.    No aphasia.  No dysarthria.  Affect pleasant.      Telemetry  SR 80s -90s   Labs    CBC Recent Labs    01/26/20 0442 01/27/20 0229  WBC 20.9* 15.9*  HGB 13.7 11.1*  HCT 40.3 32.9*  MCV 88.6 90.1  PLT 355  782   Basic Metabolic Panel Recent Labs    01/26/20 0442 01/27/20 0229  NA 139 138  K 3.3* 3.2*  CL 102 103  CO2 24 24  GLUCOSE 152* 104*  BUN <5* 7  CREATININE 1.11 0.89  CALCIUM 9.5 9.0   Liver Function Tests Recent Labs    01/25/20 0958 01/26/20 0442  AST 20 19  ALT 21 20  ALKPHOS 89 92  BILITOT 0.8 1.5*  PROT 7.3 7.3  ALBUMIN 2.9* 3.0*   No results for input(s): LIPASE, AMYLASE in the last 72 hours. Cardiac Enzymes No results for input(s): CKTOTAL, CKMB, CKMBINDEX, TROPONINI in the last 72 hours.  BNP: BNP (last 3 results) No results for input(s): BNP in the last 8760 hours.  ProBNP (last 3 results) No results for input(s): PROBNP in the last 8760 hours.   D-Dimer No results for input(s): DDIMER in the last 72 hours. Hemoglobin A1C Recent Labs    01/26/20 0830  HGBA1C 5.5   Fasting Lipid Panel No results for input(s): CHOL, HDL, LDLCALC, TRIG, CHOLHDL, LDLDIRECT in the last 72 hours. Thyroid Function Tests No results for input(s): TSH, T4TOTAL, T3FREE, THYROIDAB in the last 72 hours.  Invalid input(s): FREET3  Other results:   Imaging  DG Chest Portable 1 View  Result Date: 01/26/2020 CLINICAL DATA:  Sternal wound and driveline irrigation and debridement. EXAM: PORTABLE CHEST 1 VIEW COMPARISON:  Chest radiograph from one day prior. FINDINGS: Endotracheal tube tip is 4.6 cm above the carina. Right internal jugular central venous catheter terminates in the middle third of the SVC. Intact sternotomy wires. Left ventricular assist device overlies the cardiac apex. Stable cardiomediastinal silhouette with mild cardiomegaly. No pneumothorax. No pleural effusion. Cephalization of the pulmonary vasculature without overt pulmonary edema. IMPRESSION: 1. No pneumothorax.  Well-positioned support structures. 2. Stable mild cardiomegaly without overt pulmonary edema. Electronically Signed   By: Delbert Phenix M.D.   On: 01/26/2020 10:32   ECHOCARDIOGRAM  COMPLETE  Result Date: 01/26/2020    ECHOCARDIOGRAM REPORT   Patient Name:   Theodore Welch [facility] Date of Exam: 01/26/2020 Medical Rec #:  [id number]        Height:       71.0 in Accession #:    [id number]       Weight:       174.8 lb Date of Birth:  May 20, 1961         BSA:          1.991 m Patient Age:    58 years         BP:           89/74 mmHg Patient Gender: M                HR:           89 bpm. Exam Location:  Inpatient Procedure: 2D Echo Indications:    CHF  History:        Patient has prior history of Echocardiogram examinations, most                 recent 08/10/2018. LVAD.  Sonographer:    Thurman Coyer RDCS (AE) Referring Phys: [id number] PETER VAN TRIGT IMPRESSIONS  1. Significantly limited study with very poor endocardial visualization.  2. Left ventricular ejection fraction, by estimation, is 20%. Left ventricular ejection fraction by PLAX is 20 %. The left ventricle has severely decreased function. Left ventricular endocardial border not optimally defined to evaluate regional wall motion. There is mild left ventricular hypertrophy. Left ventricular diastolic parameters are indeterminate.  3. Right ventricular systolic function was not well visualized. The right ventricular size is not well visualized.  4. The aortic valve was not well visualized. Aortic valve regurgitation is not visualized.  5. The mitral valve was not well visualized. No evidence of mitral valve regurgitation.  6. LV inflow cannula not visualized. Comparison(s): 08/10/18: LVEF 15-20%. FINDINGS  Left Ventricular Assist Device: LV inflow cannula not visualized. Left Ventricle: Left ventricular ejection fraction, by estimation, is 20%. Left ventricular ejection fraction by PLAX is 20 % The left ventricle has severely decreased function. Left ventricular endocardial border not optimally defined to evaluate regional wall motion. The left ventricular internal cavity size was normal in size. There is mild left ventricular hypertrophy. Left  ventricular diastolic parameters are indeterminate. Right Ventricle: The right ventricular size is not well visualized. Right vetricular wall thickness was not assessed. Right ventricular systolic function was not well visualized. Left Atrium: Left atrial size was not well visualized. Right Atrium: Right atrial size was not well visualized. Pericardium: The pericardium was not well visualized. Mitral Valve: The mitral valve was not well visualized. No evidence of mitral valve regurgitation. Tricuspid Valve: The tricuspid valve is not well visualized. Tricuspid  valve regurgitation is not demonstrated. Aortic Valve: The aortic valve was not well visualized. Aortic valve regurgitation is not visualized. Pulmonic Valve: The pulmonic valve was not well visualized. Pulmonic valve regurgitation is not visualized. Aorta: The aortic root was not well visualized. Venous: The inferior vena cava was not well visualized. IAS/Shunts: The interatrial septum was not well visualized.  LEFT VENTRICLE PLAX 2D LV EF:         Left            Diastology                ventricular     LV e' medial:   5.77 cm/s                ejection        LV E/e' medial: 11.6                fraction by                PLAX is 20 % LVIDd:         4.50 cm LVIDs:         4.10 cm LV PW:         1.10 cm LV IVS:        1.10 cm LVOT diam:     2.00 cm LVOT Area:     3.14 cm  RIGHT VENTRICLE TAPSE (M-mode): 1.2 cm LEFT ATRIUM           Index       RIGHT ATRIUM           Index LA diam:      3.50 cm 1.76 cm/m  RA Area:     16.40 cm LA Vol (A4C): 37.6 ml 18.88 ml/m RA Volume:   41.40 ml  20.79 ml/m   AORTA Ao Root diam: 3.20 cm MITRAL VALVE MV Area (PHT): 4.31 cm    SHUNTS MV Decel Time: 176 msec    Systemic Diam: 2.00 cm MV E velocity: 67.20 cm/s MV A velocity: 69.50 cm/s MV E/A ratio:  0.97 Zoila Shutter MD Electronically signed by Zoila Shutter MD Signature Date/Time: 01/26/2020/4:55:58 PM    Final      Medications:     Scheduled Medications: .  sodium chloride   Intravenous Once  . acetaminophen  1,000 mg Oral Q6H   Or  . acetaminophen (TYLENOL) oral liquid 160 mg/5 mL  1,000 mg Oral Q6H  . aspirin EC  81 mg Oral Daily  . atorvastatin  20 mg Oral Daily  . bisacodyl  10 mg Oral Daily  . DULoxetine  60 mg Oral Daily  . fluticasone  1 spray Each Nare Daily  . insulin aspart  0-15 Units Subcutaneous TID WC  . insulin aspart  0-5 Units Subcutaneous QHS  . pantoprazole  40 mg Oral Daily  . potassium chloride  40 mEq Oral Once  . sacubitril-valsartan  1 tablet Oral BID  . senna-docusate  1 tablet Oral QHS  . sodium chloride flush  3 mL Intravenous Q12H  . spironolactone  25 mg Oral Daily  . thiamine  100 mg Oral Daily  . vitamin B-12  1,000 mcg Oral Daily  . zinc sulfate  220 mg Oral Daily    Infusions: . sodium chloride    . sodium chloride    . sodium chloride 10 mL/hr at 01/26/20 0300  . piperacillin-tazobactam (ZOSYN)  IV 3.375 g (01/27/20 0006)  . vancomycin 1,250 mg (01/27/20  0303)    PRN Medications: sodium chloride, sodium chloride, acetaminophen, docusate sodium, fentaNYL (SUBLIMAZE) injection, hydrALAZINE, hydrOXYzine, ipratropium-albuterol, methocarbamol, ondansetron (ZOFRAN) IV, ondansetron (ZOFRAN) IV, oxyCODONE, polyethylene glycol, sodium chloride flush, traMADol, zolpidem   Assessment/Plan   1. Subxiphoid abscess: With surrounding cellulitis.  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  CT abdomen shows subxiphoid fluid collection with superficial and deep components, collection is along the course of the driveline. Cellulitis at surface, no evidence for sternal osteomyelitis. S/P I&D 3/10 with VAC placement x2 - Back to OR next week.  Febrile to 100.1 overnight, WBCs trending down 21>16 - Blood cultures pending.  - Continue vancomycin/Zosyn.  2. 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  NYHA class I-II at  baseline.   - Volume status stable.     - Continue spironolactone 25 mg daily.  - Continue Entresto, dose decreased to 49/51 with infection but may need to increase again.    - Amlodipine has been held.  - Continue ASA 81 daily. INR 2.7  - Warfarin with goal INR 2-2.5.  No evidence for GI bleeding. Hgb today is stable. Warfarin held today for OR.  - Should be transplant candidate eventually if he can quit smoking.  Will make transplant clinic referral when he is off totally.  - Due for echo, will get this admission.  3. Smoking: Cutting back.   4. H/o RLE DVT: On warfarin for LVAD.  5. OSA: Continue CPAP.  6. Hyperlipidemia: Atorvastatin.  7. Type II diabetes: SSI while inpatient.   8. HYpokalemia Supp K.   Length of Stay: 2  Tonye Becket, NP  01/27/2020, 7:14 AM  Advanced Heart Failure Team Pager [phone] (M-F; 7a - 4p)  Please contact [facility] for night-coverage after hours (4p -7a ) and weekends on [email or web address]  Patient seen with NP, agree with the above note.   He went to the OR yesterday, lower substernal abscess I&D, did not visualize LVAD hardware but able to palpate outflow graft.  Driveline site also with I&D.  Now with wound vacs to lower sternal site and driveline site.    Echo reviewed, no evidence for vegetation but difficult images.   He is now afebrile, WBCs trending down.  Wound culture from 3/9 with few S aureus.    MAP stable in 80s on current regimen.  On exam, volume status status and wound vacs functioning appropriately.   I will consult ID.  Very concerning infection, especially given prior MSSA driveline infection and already treating with suppressive Keflex. He will go back to the OR Monday.  There is a possibility that this could lead to the need for pump exchange.  Will continue vancomcyin and Zosyn.   Hold warfarin (INR 2.7), cover with heparin when INR < 2.   Blood cultures so far negative, if possible for S aureus, will need to consider TEE (will  review with ID).   Marca Ancona 01/27/2020 9:05 AM

## 2020-01-27 NOTE — Progress Notes (Signed)
LVAD Coordinator Rounding Note:  Admitted 01/25/20 per Dr. Shirlee Latch with new onset sternal abscess.   HM III LVAD implanted on 08/03/18 by Dr. Dr. Laneta Simmers under Destination Therapy criteria due to smoking status.   Pt lying in bed. Reports he "feels better than yesterday". Does report some surgical pain through night, but he reports it was relieved by prn pain medications.   Wound vac on sternal and DL site intact with dark bloody drainage noted in container.   Vital signs: Temp: 98.7 HR: 90 Doppler Pressure: 82 Automatic BP:  91/72 (80) O2 Sat: 99% RA Wt: 187.4>174.8>178  lbs   LVAD interrogation reveals:  Speed: 5700 Flow: 5.0 Power:  4.4w PI: 2.9 Alarms: none  Events:  > 70 PI events on 01/26/20 Hematocrit: 40  Fixed speed:  5700 Low speed limit: 5400  Drive Line:  Wound vac in place post surgery today; anchor intact and accurately applied  Labs:  LDH trend: 799>800>123  INR trend: 3.2>2.2>2.7  Anticoagulation Plan: -INR Goal: 2.0 - 2.5 -ASA Dose: 81 mg daily  Device: N/A  Infection:  - 01/25/20 BCs>>pending - 01/25/20 sternal wound culture>>gm positive cocci - 01/26/20 intra-op sternal wound cultures>>pending  Plan/Recommendations:  1. Call VAD Coordinator if any VAD or drive line issues.  Hessie Diener RN, VAD Coordinator [phone]

## 2020-01-27 NOTE — Consult Note (Signed)
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 Surgical specimen with no organisms seen on gram stain and few Staphylococcus aureus on culture which is likely to be MSSA. Remains on broad spectrum coverage with vancomycin and pip/tazo and can likely reduce to vancomycin until sensitivities are reported. Scheduled for repeat I&D on Monday. Currently afebrile and hemodynamically stable. Will need continued suppression as his LVAD is likely seeded.   PLAN:  1. Discontinue piperacillin-tazobactam.  2. Continue vancomycin. 3. Wound care per CVTS and primary team.  4. Monitor cultures for sensitivities and narrow antibiotics as able.    Principal Problem:   Abscess of sternal region Active Problems:   Complication involving left ventricular assist device (LVAD)   Staph aureus infection   . sodium chloride   Intravenous Once  . acetaminophen  1,000 mg Oral Q6H   Or  . acetaminophen (TYLENOL) oral liquid 160 mg/5 mL  1,000 mg Oral Q6H  . aspirin EC  81 mg Oral Daily  . atorvastatin  20 mg Oral Daily  . bisacodyl  10 mg Oral Daily  . DULoxetine  60 mg Oral Daily  . fluticasone  1 spray Each Nare Daily  . insulin aspart  0-15 Units Subcutaneous TID WC  . insulin aspart  0-5 Units Subcutaneous QHS  . pantoprazole  40 mg Oral Daily  . potassium chloride  40 mEq Oral Daily  . sacubitril-valsartan  1 tablet Oral BID  . senna-docusate  1 tablet Oral QHS  . sodium chloride flush  3 mL Intravenous Q12H  . spironolactone  25 mg Oral Daily  . thiamine  100 mg Oral Daily  . vitamin B-12  1,000 mcg  Oral Daily  . zinc sulfate  220 mg Oral Daily     HPI: Theodore Welch is a 59 y.o. male with previous medical history of hypertension, type 2 diabetes, and congestive heart failure s/p LVAD insertion (07/2018) and complicated by development of recurrent MSSA infection initially treated on 11/26/2018 and recurred on 04/01/2019 and maintained on chronic suppression with cephalexin who was admitted on 3/9 with new large sternal abscess.  CT scan abdomen/chest from 3/9 with subcutaneous fluid collection anterior to the driveline in the subxiphoid region measuring 4.0 x 3.5 x 4.2 cm and appears contiguous with the deep fluid collection anterior inferior to the heart that measures approximately 3.1 x 5.4 x 3.6 cm.  Inflammatory fat stranding tract to the anterior pericardium.  Theodore Welch was taken to the OR by Dr. Donata Clay on 3/10 for excisional debridement, irrigation, and wound VAC placement.  Per op note abscess at the lower sternal incision was opened and necrotic tissue was debrided which extended deep down into the substernal space.  The power cord exit site at the left upper quadrant was opened with tissue being infected in the abdominal wall only.  Specimens obtained during surgery with Gram stain showing no organisms and cultures remaining pending.  Theodore Welch has had a low-grade fever of 100.7 in the last  24 hours with mildly improving leukocytosis initially 20.9 down to 15.9 today.  Currently on broad-spectrum antimicrobial therapy with vancomycin and piperacillin-tazobactam. Plan to return to the OR next week. Theodore Welch should be a transplant candidate if able to quit smoking.   Overall feeling well today. Has been consistent with his cephalexin with no adverse side effects or missed doses. First noted area on his chest as a pimple that enlarged within a 24-48 hour time frame. No recent fevers or systemic symptoms.   Review of Systems: Review of Systems  Constitutional: Negative for  chills, fever and weight loss.  Respiratory: Negative for cough, shortness of breath and wheezing.   Cardiovascular: Negative for chest pain and leg swelling.  Gastrointestinal: Negative for abdominal pain, constipation, diarrhea, nausea and vomiting.  Skin: Negative for rash.     Past Medical History:  Diagnosis Date  . 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 Cardiomyopathy, unspecified ([place])   . CHF (congestive heart failure) ([place])   . Chronic back pain   . Diabetes mellitus without complication ([place])   . Enlarged heart   . Gastric ulcer   . Gastroenteritis   . H/O degenerative disc disease   . Hypertension   . LVAD (left ventricular assist device) present [facility])     Social History   Tobacco Use  . Smoking status: Current Every Day Smoker    Types: Cigarettes    Start date: 2003  . Smokeless tobacco: Never Used  . Tobacco comment: Nicoderm patch   Substance Use Topics  . Alcohol use: Not Currently  . Drug use: Not Currently    History reviewed. No pertinent family history.  Allergies  Allergen Reactions  . Chlorhexidine Rash  . Other Rash    Prep pads    OBJECTIVE: Blood pressure 101/81, pulse 96, temperature 98.7 F (37.1 C), temperature source Oral, resp. rate 18, height 5\' 11"  (1.803 m), weight 80.8 kg, SpO2 99 %.  Physical Exam Constitutional:      General: He is not in acute distress.    Appearance: He is well-developed.  Cardiovascular:     Comments: LVAD Hum.  Pulmonary:     Effort: Pulmonary effort is normal.     Breath sounds: Normal breath sounds.  Chest:     Comments: Small abscess noted near the xyphoid process. Wound vac in place with appropriate suction.  Skin:    General: Skin is warm and dry.  Neurological:     Mental Status: He is alert and oriented to person, place, and time.  Psychiatric:        Behavior: Behavior normal.        Thought Content: Thought content normal.        Judgment: Judgment normal.     Lab Results Lab  Results  Component Value Date   WBC 15.9 (H) 01/27/2020   HGB 11.1 (L) 01/27/2020   HCT 32.9 (L) 01/27/2020   MCV 90.1 01/27/2020   PLT 300 01/27/2020    Lab Results  Component Value Date   CREATININE 0.89 01/27/2020   BUN 7 01/27/2020   NA 138 01/27/2020   K 3.2 (L) 01/27/2020   CL 103 01/27/2020   CO2 24 01/27/2020    Lab Results  Component Value Date   ALT 20 01/26/2020   AST 19 01/26/2020   ALKPHOS 92 01/26/2020   BILITOT 1.5 (H) 01/26/2020     Microbiology: Recent Results (from the past 240 hour(s))  Culture, blood (Routine X 2)  w Reflex to ID Panel     Status: None (Preliminary result)   Collection Time: 01/25/20 11:00 AM   Specimen: BLOOD LEFT WRIST  Result Value Ref Range Status   Specimen Description BLOOD LEFT WRIST  Final   Special Requests   Final    BOTTLES DRAWN AEROBIC AND ANAEROBIC Blood Culture adequate volume   Culture   Final    NO GROWTH 2 DAYS Performed at [facility], [address] [place], [place] [address]    Report Status PENDING  Incomplete  Culture, blood (routine x 2)     Status: None (Preliminary result)   Collection Time: 01/25/20 12:59 PM   Specimen: BLOOD RIGHT WRIST  Result Value Ref Range Status   Specimen Description BLOOD RIGHT WRIST  Final   Special Requests   Final    BOTTLES DRAWN AEROBIC AND ANAEROBIC Blood Culture adequate volume   Culture   Final    NO GROWTH 2 DAYS Performed at [facility], [address] [place], [place] [address]    Report Status PENDING  Incomplete  Aerobic Culture (superficial specimen)     Status: None (Preliminary result)   Collection Time: 01/25/20  3:14 PM   Specimen: Wound  Result Value Ref Range Status   Specimen Description WOUND CHEST  Final   Special Requests NONE  Final   Gram Stain   Final    MODERATE WBC PRESENT, PREDOMINANTLY PMN RARE GRAM POSITIVE COCCI    Culture   Final    FEW STAPHYLOCOCCUS AUREUS SUSCEPTIBILITIES TO FOLLOW Performed at [facility]  Lab, [address], [place], [place] [address]    Report Status PENDING  Incomplete  SARS CORONAVIRUS 2 (TAT 6-24 HRS) Nasopharyngeal Nasopharyngeal Swab     Status: None   Collection Time: 01/25/20  9:40 PM   Specimen: Nasopharyngeal Swab  Result Value Ref Range Status   SARS Coronavirus 2 NEGATIVE NEGATIVE Final    Comment: (NOTE) SARS-CoV-2 target nucleic acids are NOT DETECTED. The SARS-CoV-2 RNA is generally detectable in upper and lower respiratory specimens during the acute phase of infection. Negative results do not preclude SARS-CoV-2 infection, do not rule out co-infections with other pathogens, and should not be used as the sole basis for treatment or other patient management decisions. Negative results must be combined with clinical observations, patient history, and epidemiological information. The expected result is Negative. Fact Sheet for Patients: HairSlick.no Fact Sheet for Healthcare Providers: quierodirigir.com This test is not yet approved or cleared by the [place] FDA and  has been authorized for detection and/or diagnosis of SARS-CoV-2 by FDA under an Emergency Use Authorization (EUA). This EUA will remain  in effect (meaning this test can be used) for the duration of the COVID-19 declaration under Section 56 4(b)(1) of the Act, 21 U.S.C. section 360bbb-3(b)(1), unless the authorization is terminated or revoked sooner. Performed at [facility], [address] [place], [place] [address]   MRSA PCR Screening     Status: None   Collection Time: 01/25/20  9:46 PM   Specimen: Nasal Mucosa; Nasopharyngeal  Result Value Ref Range Status   MRSA by PCR NEGATIVE NEGATIVE Final    Comment:        The GeneXpert MRSA Assay (FDA approved for NASAL specimens only), is one component of a comprehensive MRSA colonization surveillance program. It is not intended to diagnose MRSA infection nor to guide  or monitor treatment for MRSA infections. Performed at [facility], [address] [place], [place] [address]   Aerobic/Anaerobic Culture (surgical/deep wound)     Status: None (Preliminary result)   Collection Time: 01/26/20  9:15 AM   Specimen: PATH Soft tissue  Result Value Ref Range Status   Specimen Description TISSUE STERNAL WOUND IRRIGATION AND DEBRIDGEMENT  Final   Special Requests A  Final   Gram Stain   Final    FEW WBC PRESENT, PREDOMINANTLY PMN NO ORGANISMS SEEN    Culture   Final    FEW STAPHYLOCOCCUS AUREUS CULTURE REINCUBATED FOR BETTER GROWTH Performed at [facility], [address], [place], [place] [address]    Report Status PENDING  Incomplete     Marcos Eke, NP [facility] [phone] Pager  01/27/2020  2:08 PM

## 2020-01-27 NOTE — Progress Notes (Signed)
    ID Consulted for driveline infection   Discussed with Dr Drue Second. ID recommendations appreciated.   Izaih Kataoka NP-C  8:48 AM

## 2020-01-27 NOTE — Progress Notes (Signed)
1517: Pt wound vac had a "blockage alarm". Lower Vac line tubing is clogged with dry, old blood. Notified VAD coordinator, Kirt Boys. Paged PA, [place] about the situation. Was told he will come assess the pt after his meeting.  16:10 PA, [place] saw pt. Changed the lower vac line and sponge with Enid Derry, Charity fundraiser.  Wound vac is currently working, will keep closer monitoring.

## 2020-01-27 NOTE — Progress Notes (Addendum)
[address]       [place] [address]             [phone]      1 Day Post-Op Procedure(s) (LRB): STERNAL WOUND & driveline IRRIGATION AND DEBRIDEMENT (N/A) APPLICATION OF WOUND VAC (N/A) Subjective: A little weak today following procedure , but overall feels pretty well  Objective: Vital signs in last 24 hours: Temp:  [98.5 F (36.9 C)-100.7 F (38.2 C)] 98.7 F (37.1 C) (03/11 0735) Pulse Rate:  [79-99] 88 (03/11 0735) Cardiac Rhythm: Normal sinus rhythm (03/11 0704) Resp:  [18-33] 22 (03/11 0735) BP: (84-101)/(55-86) 91/72 (03/11 0735) SpO2:  [94 %-100 %] 96 % (03/11 0735) Weight:  [80.8 kg] 80.8 kg (03/11 0355)  Hemodynamic parameters for last 24 hours:    Intake/Output from previous day: 03/10 0701 - 03/11 0700 In: 2380 [P.O.:730; I.V.:1100; IV Piggyback:550] Out: 900 [Urine:600; Drains:100; Blood:200] Intake/Output this shift: No intake/output data recorded.  General appearance: alert, cooperative and no distress Wound: VACx 2, some bloody drainage  Lab Results: Recent Labs    01/26/20 0442 01/27/20 0229  WBC 20.9* 15.9*  HGB 13.7 11.1*  HCT 40.3 32.9*  PLT 355 300   BMET:  Recent Labs    01/26/20 0442 01/27/20 0229  NA 139 138  K 3.3* 3.2*  CL 102 103  CO2 24 24  GLUCOSE 152* 104*  BUN <5* 7  CREATININE 1.11 0.89  CALCIUM 9.5 9.0    PT/INR:  Recent Labs    01/27/20 0229  LABPROT 29.0*  INR 2.7*   ABG    Component Value Date/Time   PHART 7.625 (HH) 01/25/2020 2108   HCO3 23.6 01/25/2020 2108   TCO2 26 08/04/2018 1622   O2SAT 97.9 01/25/2020 2108   CBG (last 3)  Recent Labs    01/26/20 1649 01/26/20 2113 01/27/20 0607  GLUCAP 108* 95 90    Meds Scheduled Meds: . sodium chloride   Intravenous Once  . acetaminophen  1,000 mg Oral Q6H   Or  . acetaminophen (TYLENOL) oral liquid 160 mg/5 mL  1,000 mg Oral Q6H  . aspirin EC  81 mg Oral Daily  . atorvastatin  20 mg Oral Daily  . bisacodyl  10 mg  Oral Daily  . DULoxetine  60 mg Oral Daily  . fluticasone  1 spray Each Nare Daily  . insulin aspart  0-15 Units Subcutaneous TID WC  . insulin aspart  0-5 Units Subcutaneous QHS  . pantoprazole  40 mg Oral Daily  . potassium chloride  40 mEq Oral Once  . potassium chloride  40 mEq Oral Daily  . sacubitril-valsartan  1 tablet Oral BID  . senna-docusate  1 tablet Oral QHS  . sodium chloride flush  3 mL Intravenous Q12H  . spironolactone  25 mg Oral Daily  . thiamine  100 mg Oral Daily  . vitamin B-12  1,000 mcg Oral Daily  . zinc sulfate  220 mg Oral Daily   Continuous Infusions: . sodium chloride    . sodium chloride    . sodium chloride 10 mL/hr at 01/26/20 0300  . piperacillin-tazobactam (ZOSYN)  IV 3.375 g (01/27/20 0006)  . vancomycin 1,250 mg (01/27/20 0303)   PRN Meds:.sodium chloride, sodium chloride, acetaminophen, docusate sodium, fentaNYL (SUBLIMAZE) injection, hydrALAZINE, hydrOXYzine, ipratropium-albuterol, methocarbamol, ondansetron (ZOFRAN) IV, ondansetron (ZOFRAN) IV, oxyCODONE, polyethylene glycol, sodium chloride flush, traMADol, zolpidem  Xrays DG Chest 2 View  Result Date: 01/25/2020 CLINICAL DATA:  Sternal abscess. EXAM: CHEST - 2 VIEW COMPARISON:  CT immediately prior. Radiograph 04/21/2019 FINDINGS: Median sternotomy. Left ventricular assist device. Cardiomegaly is grossly similar to prior. No pulmonary edema, confluent airspace disease or pleural effusion. No pneumothorax. Mild soft tissue edema of the anterior chest wall. IMPRESSION: Stable cardiomegaly with left ventricular assist device in place. Subxiphoid fluid collection on CT is not demonstrated radiographically. Electronically Signed   By: Keith Rake M.D.   On: 01/25/2020 20:19   CT CHEST W CONTRAST  Result Date: 01/25/2020 CLINICAL DATA:  Soft tissue infection suspected, abdomen, no prior imaging LVAD cellulitis; Soft tissue infection suspected, abdomen, no prior imaging LVAD cellulits Sternal  abscess. EXAM: CT CHEST, ABDOMEN, AND PELVIS WITH CONTRAST TECHNIQUE: Multidetector CT imaging of the chest, abdomen and pelvis was performed following the standard protocol during bolus administration of intravenous contrast. CONTRAST:  131mL OMNIPAQUE IOHEXOL 300 MG/ML  SOLN COMPARISON:  Chest abdomen pelvis CT 08/31/2019 FINDINGS: CT CHEST FINDINGS Cardiovascular: Minor aortic atherosclerosis. Left ventricular assist device in place which causes regional streak artifact. Aortic conduit is patent. Cardiomegaly. No definite pericardial effusion, there is some stranding of the anterior pericardium. Coronary artery calcifications. Mediastinum/Nodes: Scattered mediastinal lymph nodes are prominent but all subcentimeter short axis. Few prominent left axillary lymph nodes, also subcentimeter. No hilar adenopathy. No esophageal wall thickening. Visualized thyroid nodule. Lungs/Pleura: Streaky opacities in the left lower lobe likely related to scarring. No acute airspace disease. Minimal apical emphysema. No pulmonary edema. No pleural fluid. Minimal mucus within the dependent trachea. Musculoskeletal: Drive line extends in the subxiphoid region in the subcutaneous tissues of the anterior abdomen just to the left of midline. Subcutaneous fluid collection just anterior to the driveline in the subxiphoid region measures 4.0 x 3.5 x 4.2 cm. This fluid collection appears contiguous with the deep fluid collection anterior inferior to the heart that measures approximately 3.1 x 5.4 x 3.6 cm, detailed evaluation partially obscured by streak artifact. Inflammatory fat stranding tracks to the anterior pericardium. Skin thickening and soft tissue edema superficial to the lower aspect of the median sternotomy. No discrete osseous erosions or periosteal thickening. Similar degenerative change in the spine. CT ABDOMEN PELVIS FINDINGS Hepatobiliary: No focal hepatic lesion or intrahepatic fluid collection. Gallbladder physiologically  distended, no calcified stone. No biliary dilatation. Pancreas: No ductal dilatation or inflammation. Spleen: Lobulated splenic contour with cleft posteriorly and small amount of adjacent perisplenic fluid. This measures simple fluid density. No focal splenic lesion. Adrenals/Urinary Tract: No adrenal nodule. No hydronephrosis or perinephric edema. Homogeneous renal enhancement with symmetric excretion on delayed phase imaging. Cyst in the upper right kidney is unchanged. Urinary bladder is partially distended without wall thickening. Stomach/Bowel: Stomach partially distended. Normal positioning of the ligament of Treitz. No bowel obstruction or inflammatory change. Fluid-filled minimally prominent loops of small bowel in the left mid abdomen and right lower quadrant are likely reactive, there is no associated bowel wall thickening. Normal appendix. Diverticulosis of the distal colon. No acute diverticulitis. Vascular/Lymphatic: Aortic atherosclerosis. No aortic aneurysm. The portal vein is patent. No enlarged lymph nodes in the abdomen or pelvis. Reproductive: Prostate is unremarkable. Other: Small amount of simple free fluid adjacent to the posterior spleen. No other ascites. No free air. Similar soft tissue density adjacent to the exiting portion of the drive wire in the left lower abdomen. No discrete subcutaneous fluid collection. Musculoskeletal: There are no acute or suspicious osseous abnormalities. Hemi transitional lumbosacral anatomy. Degenerative change in the lower lumbar spine. IMPRESSION: 1. Subxiphoid fluid  collection with both superficial and deep components lateral likely contiguous. Superficial component measures 4.0 x 3.5 x 4.2 cm, D component 3.1 x 5.4 x 3.6 cm. Collection is along the course of the drive wire from left ventricular assist device. Associated inflammatory changes in the subcutaneous tissues in stranding in the anterior mediastinum. 2. Skin thickening and soft tissue edema  superficial to the lower aspect of the median sternotomy consistent with cellulitis. No discrete osseous erosions or periosteal thickening to suggest osteomyelitis. 3. Small amount of simple free fluid adjacent to the spleen. 4. Colonic diverticulosis without diverticulitis. Aortic Atherosclerosis (ICD10-I70.0). Electronically Signed   By: Narda Rutherford M.D.   On: 01/25/2020 19:30   CT ABDOMEN W CONTRAST  Result Date: 01/25/2020 CLINICAL DATA:  Soft tissue infection suspected, abdomen, no prior imaging LVAD cellulitis; Soft tissue infection suspected, abdomen, no prior imaging LVAD cellulits Sternal abscess. EXAM: CT CHEST, ABDOMEN, AND PELVIS WITH CONTRAST TECHNIQUE: Multidetector CT imaging of the chest, abdomen and pelvis was performed following the standard protocol during bolus administration of intravenous contrast. CONTRAST:  OMNIPAQUE IOHEXOL 300 MG/ML  SOLN COMPARISON:  Chest abdomen pelvis CT 08/31/2019 FINDINGS: CT CHEST FINDINGS Cardiovascular: Minor aortic atherosclerosis. Left ventricular assist device in place which causes regional streak artifact. Aortic conduit is patent. Cardiomegaly. No definite pericardial effusion, there is some stranding of the anterior pericardium. Coronary artery calcifications. Mediastinum/Nodes: Scattered mediastinal lymph nodes are prominent but all subcentimeter short axis. Few prominent left axillary lymph nodes, also subcentimeter. No hilar adenopathy. No esophageal wall thickening. Visualized thyroid nodule. Lungs/Pleura: Streaky opacities in the left lower lobe likely related to scarring. No acute airspace disease. Minimal apical emphysema. No pulmonary edema. No pleural fluid. Minimal mucus within the dependent trachea. Musculoskeletal: Drive line extends in the subxiphoid region in the subcutaneous tissues of the anterior abdomen just to the left of midline. Subcutaneous fluid collection just anterior to the driveline in the subxiphoid region measures  4.0 x 3.5 x 4.2 cm. This fluid collection appears contiguous with the deep fluid collection anterior inferior to the heart that measures approximately 3.1 x 5.4 x 3.6 cm, detailed evaluation partially obscured by streak artifact. Inflammatory fat stranding tracks to the anterior pericardium. Skin thickening and soft tissue edema superficial to the lower aspect of the median sternotomy. No discrete osseous erosions or periosteal thickening. Similar degenerative change in the spine. CT ABDOMEN PELVIS FINDINGS Hepatobiliary: No focal hepatic lesion or intrahepatic fluid collection. Gallbladder physiologically distended, no calcified stone. No biliary dilatation. Pancreas: No ductal dilatation or inflammation. Spleen: Lobulated splenic contour with cleft posteriorly and small amount of adjacent perisplenic fluid. This measures simple fluid density. No focal splenic lesion. Adrenals/Urinary Tract: No adrenal nodule. No hydronephrosis or perinephric edema. Homogeneous renal enhancement with symmetric excretion on delayed phase imaging. Cyst in the upper right kidney is unchanged. Urinary bladder is partially distended without wall thickening. Stomach/Bowel: Stomach partially distended. Normal positioning of the ligament of Treitz. No bowel obstruction or inflammatory change. Fluid-filled minimally prominent loops of small bowel in the left mid abdomen and right lower quadrant are likely reactive, there is no associated bowel wall thickening. Normal appendix. Diverticulosis of the distal colon. No acute diverticulitis. Vascular/Lymphatic: Aortic atherosclerosis. No aortic aneurysm. The portal vein is patent. No enlarged lymph nodes in the abdomen or pelvis. Reproductive: Prostate is unremarkable. Other: Small amount of simple free fluid adjacent to the posterior spleen. No other ascites. No free air. Similar soft tissue density adjacent to the exiting portion of the  drive wire in the left lower abdomen. No discrete  subcutaneous fluid collection. Musculoskeletal: There are no acute or suspicious osseous abnormalities. Hemi transitional lumbosacral anatomy. Degenerative change in the lower lumbar spine. IMPRESSION: 1. Subxiphoid fluid collection with both superficial and deep components lateral likely contiguous. Superficial component measures 4.0 x 3.5 x 4.2 cm, D component 3.1 x 5.4 x 3.6 cm. Collection is along the course of the drive wire from left ventricular assist device. Associated inflammatory changes in the subcutaneous tissues in stranding in the anterior mediastinum. 2. Skin thickening and soft tissue edema superficial to the lower aspect of the median sternotomy consistent with cellulitis. No discrete osseous erosions or periosteal thickening to suggest osteomyelitis. 3. Small amount of simple free fluid adjacent to the spleen. 4. Colonic diverticulosis without diverticulitis. Aortic Atherosclerosis (ICD10-I70.0). Electronically Signed   By: Narda Rutherford M.D.   On: 01/25/2020 19:30   DG Chest Portable 1 View  Result Date: 01/26/2020 CLINICAL DATA:  Sternal wound and driveline irrigation and debridement. EXAM: PORTABLE CHEST 1 VIEW COMPARISON:  Chest radiograph from one day prior. FINDINGS: Endotracheal tube tip is 4.6 cm above the carina. Right internal jugular central venous catheter terminates in the middle third of the SVC. Intact sternotomy wires. Left ventricular assist device overlies the cardiac apex. Stable cardiomediastinal silhouette with mild cardiomegaly. No pneumothorax. No pleural effusion. Cephalization of the pulmonary vasculature without overt pulmonary edema. IMPRESSION: 1. No pneumothorax.  Well-positioned support structures. 2. Stable mild cardiomegaly without overt pulmonary edema. Electronically Signed   By: Delbert Phenix M.D.   On: 01/26/2020 10:32   ECHOCARDIOGRAM COMPLETE  Result Date: 01/26/2020    ECHOCARDIOGRAM REPORT   Patient Name:   [facility] Date of Exam: 01/26/2020  Medical Rec #:  [id number]        Height:       71.0 in Accession #:    [id number]       Weight:       174.8 lb Date of Birth:  [birth year]         BSA:          1.991 m Patient Age:    58 years         BP:           89/74 mmHg Patient Gender: M                HR:           89 bpm. Exam Location:  Inpatient Procedure: 2D Echo Indications:    CHF  History:        Patient has prior history of Echocardiogram examinations, most                 recent 08/10/2018. LVAD.  Sonographer:    Thurman Coyer RDCS (AE) Referring Phys: [id number] Akacia Boltz VAN TRIGT IMPRESSIONS  1. Significantly limited study with very poor endocardial visualization.  2. Left ventricular ejection fraction, by estimation, is 20%. Left ventricular ejection fraction by PLAX is 20 %. The left ventricle has severely decreased function. Left ventricular endocardial border not optimally defined to evaluate regional wall motion. There is mild left ventricular hypertrophy. Left ventricular diastolic parameters are indeterminate.  3. Right ventricular systolic function was not well visualized. The right ventricular size is not well visualized.  4. The aortic valve was not well visualized. Aortic valve regurgitation is not visualized.  5. The mitral valve was not well visualized. No evidence of mitral valve regurgitation.  6. LV  inflow cannula not visualized. Comparison(s): 08/10/18: LVEF 15-20%. FINDINGS  Left Ventricular Assist Device: LV inflow cannula not visualized. Left Ventricle: Left ventricular ejection fraction, by estimation, is 20%. Left ventricular ejection fraction by PLAX is 20 % The left ventricle has severely decreased function. Left ventricular endocardial border not optimally defined to evaluate regional wall motion. The left ventricular internal cavity size was normal in size. There is mild left ventricular hypertrophy. Left ventricular diastolic parameters are indeterminate. Right Ventricle: The right ventricular size is not well visualized. Right  vetricular wall thickness was not assessed. Right ventricular systolic function was not well visualized. Left Atrium: Left atrial size was not well visualized. Right Atrium: Right atrial size was not well visualized. Pericardium: The pericardium was not well visualized. Mitral Valve: The mitral valve was not well visualized. No evidence of mitral valve regurgitation. Tricuspid Valve: The tricuspid valve is not well visualized. Tricuspid valve regurgitation is not demonstrated. Aortic Valve: The aortic valve was not well visualized. Aortic valve regurgitation is not visualized. Pulmonic Valve: The pulmonic valve was not well visualized. Pulmonic valve regurgitation is not visualized. Aorta: The aortic root was not well visualized. Venous: The inferior vena cava was not well visualized. IAS/Shunts: The interatrial septum was not well visualized.  LEFT VENTRICLE PLAX 2D LV EF:         Left            Diastology                ventricular     LV e' medial:   5.77 cm/s                ejection        LV E/e' medial: 11.6                fraction by                PLAX is 20 % LVIDd:         4.50 cm LVIDs:         4.10 cm LV PW:         1.10 cm LV IVS:        1.10 cm LVOT diam:     2.00 cm LVOT Area:     3.14 cm  RIGHT VENTRICLE TAPSE (M-mode): 1.2 cm LEFT ATRIUM           Index       RIGHT ATRIUM           Index LA diam:      3.50 cm 1.76 cm/m  RA Area:     16.40 cm LA Vol (A4C): 37.6 ml 18.88 ml/m RA Volume:   41.40 ml  20.79 ml/m   AORTA Ao Root diam: 3.20 cm MITRAL VALVE MV Area (PHT): 4.31 cm    SHUNTS MV Decel Time: 176 msec    Systemic Diam: 2.00 cm MV E velocity: 67.20 cm/s MV A velocity: 69.50 cm/s MV E/A ratio:  0.97 Zoila Shutter MD Electronically signed by Zoila Shutter MD Signature Date/Time: 01/26/2020/4:55:58 PM    Final    Results for orders placed or performed during the hospital encounter of 01/25/20  Culture, blood (Routine X 2) w Reflex to ID Panel     Status: None (Preliminary result)    Collection Time: 01/25/20 11:00 AM   Specimen: BLOOD LEFT WRIST  Result Value Ref Range Status   Specimen Description BLOOD LEFT WRIST  Final   Special Requests  Final    BOTTLES DRAWN AEROBIC AND ANAEROBIC Blood Culture adequate volume   Culture   Final    NO GROWTH 2 DAYS Performed at [facility], [address] [place], [place] [address]    Report Status PENDING  Incomplete  SARS CORONAVIRUS 2 (TAT 6-24 HRS) Nasopharyngeal Nasopharyngeal Swab     Status: None   Collection Time: 01/25/20  9:40 PM   Specimen: Nasopharyngeal Swab  Result Value Ref Range Status   SARS Coronavirus 2 NEGATIVE NEGATIVE Final    Comment: (NOTE) SARS-CoV-2 target nucleic acids are NOT DETECTED. The SARS-CoV-2 RNA is generally detectable in upper and lower respiratory specimens during the acute phase of infection. Negative results do not preclude SARS-CoV-2 infection, do not rule out co-infections with other pathogens, and should not be used as the sole basis for treatment or other patient management decisions. Negative results must be combined with clinical observations, patient history, and epidemiological information. The expected result is Negative. Fact Sheet for Patients: HairSlick.no Fact Sheet for Healthcare Providers: quierodirigir.com This test is not yet approved or cleared by the [place] FDA and  has been authorized for detection and/or diagnosis of SARS-CoV-2 by FDA under an Emergency Use Authorization (EUA). This EUA will remain  in effect (meaning this test can be used) for the duration of the COVID-19 declaration under Section 56 4(b)(1) of the Act, 21 U.S.C. section 360bbb-3(b)(1), unless the authorization is terminated or revoked sooner. Performed at [facility], [address] [place], [place] [address]   MRSA PCR Screening     Status: None   Collection Time: 01/25/20  9:46 PM   Specimen: Nasal Mucosa;  Nasopharyngeal  Result Value Ref Range Status   MRSA by PCR NEGATIVE NEGATIVE Final    Comment:        The GeneXpert MRSA Assay (FDA approved for NASAL specimens only), is one component of a comprehensive MRSA colonization surveillance program. It is not intended to diagnose MRSA infection nor to guide or monitor treatment for MRSA infections. Performed at [facility], [address], [place], [place] [address]   Aerobic/Anaerobic Culture (surgical/deep wound)     Status: None (Preliminary result)   Collection Time: 01/26/20  9:15 AM   Specimen: PATH Soft tissue  Result Value Ref Range Status   Specimen Description TISSUE STERNAL WOUND IRRIGATION AND DEBRIDGEMENT  Final   Special Requests A  Final   Gram Stain   Final    FEW WBC PRESENT, PREDOMINANTLY PMN NO ORGANISMS SEEN Performed at [facility], [address], [place], [place] [address]    Culture PENDING  Incomplete   Report Status PENDING  Incomplete   Assessment/Plan: S/P Procedure(s) (LRB): STERNAL WOUND & driveline IRRIGATION AND DEBRIDEMENT (N/A) APPLICATION OF WOUND VAC (N/A)  1 VAC x 2, some bloody drainage, HCT 40.32.9- observe 2 Tmax 100.7, cont Vanco and zosyn for now, leukocytosis trend improving- ID to see 3 plan for return to OR on Monday    LOS: 2 days    Rowe Clack PA-C Pager [phone] 01/27/2020   Cultures of operative material- staph [ prior hx MSSA], acid fast smear negative Plan is contiue IV antibiotics, PRn operative wound debridement and irrigation with VAC [ sched Monday]  and will consult Dr Ulice Bold to assess for possible muscle flap coverage in the future

## 2020-01-27 NOTE — Progress Notes (Signed)
ANTICOAGULATION CONSULT NOTE - Initial Consult  Pharmacy Consult for warfarin Indication: LVAD  Allergies  Allergen Reactions  . Chlorhexidine Rash  . Other Rash    Prep pads    Patient Measurements: Height: 5\' 11"  (180.3 cm) Weight: 178 lb 2.1 oz (80.8 kg) IBW/kg (Calculated) : 75.3 Heparin Dosing Weight: 85 kg  Vital Signs: Temp: 98.7 F (37.1 C) (03/11 0735) Temp Source: Oral (03/11 0735) BP: 91/72 (03/11 0735) Pulse Rate: 88 (03/11 0735)  Labs: Recent Labs    01/25/20 0958 01/25/20 0958 01/26/20 0442 01/27/20 0229  HGB 14.5   < > 13.7 11.1*  HCT 42.0  --  40.3 32.9*  PLT 381  --  355 300  LABPROT 32.6*  --  23.9* 29.0*  INR 3.2*  --  2.2* 2.7*  CREATININE 0.93  --  1.11 0.89   < > = values in this interval not displayed.    Estimated Creatinine Clearance: 94 mL/min (by C-G formula based on SCr of 0.89 mg/dL).   Medical History: Past Medical History:  Diagnosis Date  . 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 Cardiomyopathy, unspecified (HCC)   . CHF (congestive heart failure) (HCC)   . Chronic back pain   . Diabetes mellitus without complication (HCC)   . Enlarged heart   . Gastric ulcer   . Gastroenteritis   . H/O degenerative disc disease   . Hypertension   . LVAD (left ventricular assist device) present [facility])     Assessment: 58yom presenting with concern of large sternal abscess- starting on antibiotics. On warfarin PTA for hx LVAD (goal 2-2.5).   INR today came back supratherapeutic at 2.7 - off warfarin. Hgb 11, plt 300. LDH 159. No s/sx of bleeding.   PTA regimen is warfarin 5 mg daily except 7.5 mg TTS.   Goal of Therapy:  INR 2-2.5 Monitor platelets by anticoagulation protocol: Yes   Plan:  Hold warfarin tonight Will start heparin infusion once INR<2 and ok with MD Monitor INR, CBC, and for s/sx of bleeding   [facility], PharmD, BCCCP Clinical Pharmacist  Phone: [phone]  Please check AMION for all [facility] Pharmacy phone  numbers After 10:00 PM, call Main Pharmacy [phone] 01/27/2020,9:57 AM

## 2020-01-27 NOTE — Progress Notes (Signed)
      [address]       [place] [address]             [phone]     Vac line tubing (lower VAC around drive line) became occluded with blood clots. Changed the tubing out with new dressing and currently functioning. Will need close ongoing observation as has potential to recur. May need more formal VAC replacement prior to Monday .   Rowe Clack, PA-C

## 2020-01-27 NOTE — Plan of Care (Signed)
  Problem: Education: Goal: Knowledge of General Education information will improve Description: Including pain rating scale, medication(s)/side effects and non-pharmacologic comfort measures 01/27/2020 2106 by Thea Gist, RN Outcome: Progressing 01/27/2020 2106 by Thea Gist, RN Outcome: Progressing

## 2020-01-28 DIAGNOSIS — L02213 Cutaneous abscess of chest wall: Secondary | ICD-10-CM

## 2020-01-28 LAB — AEROBIC CULTURE W GRAM STAIN (SUPERFICIAL SPECIMEN)

## 2020-01-28 LAB — COMPREHENSIVE METABOLIC PANEL
ALT: 14 U/L (ref 0–44)
AST: 17 U/L (ref 15–41)
Albumin: 2.6 g/dL — ABNORMAL LOW (ref 3.5–5.0)
Alkaline Phosphatase: 64 U/L (ref 38–126)
Anion gap: 8 (ref 5–15)
BUN: 5 mg/dL — ABNORMAL LOW (ref 6–20)
CO2: 24 mmol/L (ref 22–32)
Calcium: 8.8 mg/dL — ABNORMAL LOW (ref 8.9–10.3)
Chloride: 108 mmol/L (ref 98–111)
Creatinine, Ser: 0.71 mg/dL (ref 0.61–1.24)
GFR calc Af Amer: >60 mL/min (ref >60)
GFR calc non Af Amer: >60 mL/min (ref >60)
Glucose, Bld: 91 mg/dL (ref 70–99)
Potassium: 3.5 mmol/L (ref 3.5–5.1)
Sodium: 140 mmol/L (ref 135–145)
Total Bilirubin: 0.8 mg/dL (ref 0.3–1.2)
Total Protein: 6.1 g/dL — ABNORMAL LOW (ref 6.5–8.1)

## 2020-01-28 LAB — TYPE AND SCREEN
ABO/RH(D): O POS
Antibody Screen: NEGATIVE
Unit division: 0
Unit division: 0

## 2020-01-28 LAB — BPAM RBC
Blood Product Expiration Date: 202104082359
Blood Product Expiration Date: 202104082359
ISSUE DATE / TIME: 202103100922
ISSUE DATE / TIME: 202103100922
Unit Type and Rh: 5100
Unit Type and Rh: 5100

## 2020-01-28 LAB — CBC
HCT: 31.3 % — ABNORMAL LOW (ref 39.0–52.0)
Hemoglobin: 10.5 g/dL — ABNORMAL LOW (ref 13.0–17.0)
MCH: 31.1 pg (ref 26.0–34.0)
MCHC: 33.5 g/dL (ref 30.0–36.0)
MCV: 92.6 fL (ref 80.0–100.0)
Platelets: 311 10*3/uL (ref 150–400)
RBC: 3.38 MIL/uL — ABNORMAL LOW (ref 4.22–5.81)
RDW: 12.4 % (ref 11.5–15.5)
WBC: 10.9 10*3/uL — ABNORMAL HIGH (ref 4.0–10.5)
nRBC: 0 % (ref 0.0–0.2)

## 2020-01-28 LAB — GLUCOSE, CAPILLARY
Glucose-Capillary: 85 mg/dL (ref 70–99)
Glucose-Capillary: 105 mg/dL — ABNORMAL HIGH (ref 70–99)
Glucose-Capillary: 111 mg/dL — ABNORMAL HIGH (ref 70–99)
Glucose-Capillary: 79 mg/dL (ref 70–99)

## 2020-01-28 LAB — LACTATE DEHYDROGENASE: LDH: 130 U/L (ref 98–192)

## 2020-01-28 LAB — VANCOMYCIN, PEAK: Vancomycin Pk: 35 ug/mL (ref 30–40)

## 2020-01-28 LAB — PROTIME-INR
INR: 2 — ABNORMAL HIGH (ref 0.8–1.2)
Prothrombin Time: 22.6 seconds — ABNORMAL HIGH (ref 11.4–15.2)

## 2020-01-28 MED ORDER — SODIUM CHLORIDE 0.9 % IV SOLN
20.0000 ug | Freq: Once | INTRAVENOUS | Status: AC
  Administered 2020-01-28: 20 ug via INTRAVENOUS
  Filled 2020-01-28: qty 5

## 2020-01-28 MED ORDER — SODIUM CHLORIDE 0.9 % IV BOLUS
500.0000 mL | Freq: Once | INTRAVENOUS | Status: AC
  Administered 2020-01-28: 500 mL via INTRAVENOUS

## 2020-01-28 MED ORDER — CEFAZOLIN SODIUM-DEXTROSE 2-4 GM/100ML-% IV SOLN
2.0000 g | Freq: Three times a day (TID) | INTRAVENOUS | Status: DC
  Administered 2020-01-28 – 2020-02-09 (×37): 2 g via INTRAVENOUS
  Filled 2020-01-28 (×39): qty 100

## 2020-01-28 NOTE — Progress Notes (Signed)
Pharmacy Antibiotic Note  Theodore Welch is a 59 y.o. male admitted on 01/25/2020 with cellulitis and LVAD driveline infection. 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 Wound culture growing MSSA again. Pharmacy has been consulted for cefazolin dosing.  Pt is afebrile, WBC trending down to 10.9. SCr down to 0.71. Pt returning to OR on Monday.  Plan: Cefazolin 2 gm IV q8h Monitor renal fxn, clinical improvement, and post-op plans ID team following  Height: 5\' 11"  (180.3 cm) Weight: 178 lb 12.7 oz (81.1 kg) IBW/kg (Calculated) : 75.3  Temp (24hrs), Avg:98.1 F (36.7 C), Min:97.8 F (36.6 C), Max:98.3 F (36.8 C)  Recent Labs  Lab 01/25/20 0958 01/26/20 0442 01/27/20 0229 01/27/20 1700 01/28/20 0605  WBC 11.6* 20.9* 15.9*  --  10.9*  CREATININE 0.93 1.11 0.89  --  0.71  VANCOTROUGH  --   --   --  10*  --   VANCOPEAK  --   --   --   --  35    Estimated Creatinine Clearance: 104.6 mL/min (by C-G formula based on SCr of 0.71 mg/dL).    Allergies  Allergen Reactions  . Chlorhexidine Rash  . Other Rash    Prep pads    Antimicrobials this admission: Cefazolin 3/12 >> Vancomycin 3/9 >> 3/12 Zosyn 3/9 >> 3/10  Microbiology results: 3/9 COVID: negative 3/9 MRSA PCR: negative 3/9 Wound cx: MSSA 3/9 BCx: NGTD 3/10 AFB: negative   Thank you for allowing pharmacy to be a part of this patient's care.  5/9, PharmD PGY1 Pharmacy Resident  Please check AMION for all [facility] Pharmacy phone numbers After 10:00 PM, call Main Pharmacy [phone]  01/28/2020 11:53 AM

## 2020-01-28 NOTE — Progress Notes (Addendum)
ANTICOAGULATION CONSULT NOTE  Pharmacy Consult for warfarin Indication: LVAD  Allergies  Allergen Reactions  . Chlorhexidine Rash  . Other Rash    Prep pads    Patient Measurements: Height: 5\' 11"  (180.3 cm) Weight: 178 lb 12.7 oz (81.1 kg) IBW/kg (Calculated) : 75.3 Heparin Dosing Weight: 85 kg  Vital Signs: Temp: 98.1 F (36.7 C) (03/12 0836) Temp Source: Oral (03/12 0836) BP: 93/54 (03/12 0836) Pulse Rate: 80 (03/12 0836)  Labs: Recent Labs    01/26/20 0442 01/26/20 0442 01/27/20 0229 01/28/20 0605 01/28/20 0652  HGB 13.7   < > 11.1* 10.5*  --   HCT 40.3  --  32.9* 31.3*  --   PLT 355  --  300 311  --   LABPROT 23.9*  --  29.0*  --  22.6*  INR 2.2*  --  2.7*  --  2.0*  CREATININE 1.11  --  0.89 0.71  --    < > = values in this interval not displayed.    Estimated Creatinine Clearance: 104.6 mL/min (by C-G formula based on SCr of 0.71 mg/dL).   Medical History: Past Medical History:  Diagnosis Date  . 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 Cardiomyopathy, unspecified (HCC)   . CHF (congestive heart failure) (HCC)   . Chronic back pain   . Diabetes mellitus without complication (HCC)   . Enlarged heart   . Gastric ulcer   . Gastroenteritis   . H/O degenerative disc disease   . Hypertension   . LVAD (left ventricular assist device) present [facility])     Assessment: 58yom presenting with concern of large sternal abscess- starting on antibiotics. On warfarin PTA for hx LVAD (goal 2-2.5).   INR today came back therapeutic at 2 - off warfarin. Hgb 10.5, plt 311. LDH 130. Having bleeding from the wounds, s/p DDAVP 3/12.  PTA regimen is warfarin 5 mg daily except 7.5 mg TTS.   Goal of Therapy:  INR 2-2.5 Monitor platelets by anticoagulation protocol: Yes   Plan:  Hold warfarin tonight  Will start heparin infusion once INR<2 -okay to wait to recheck INR on 3/13 per MD Monitor INR, CBC, and for s/sx of bleeding   4/13, PharmD, BCCCP Clinical  Pharmacist  Phone: [phone]  Please check AMION for all [facility] Pharmacy phone numbers After 10:00 PM, call Main Pharmacy [phone] 01/28/2020,10:35 AM

## 2020-01-28 NOTE — Progress Notes (Signed)
[facility]  Date of Admission:  01/25/2020     Total days of antibiotics 4         ASSESSMENT:  Theodore Welch has infection with Staphylococcus aureus with sensitivities to be determined. Blood cultures remain without growth.  Afebrile overnight with improved leukocytosis. Dressings changed today with repeat surgery planned for Monday. Renal function stable without evidence of nephrotoxicity. Continue current dose of vancomycin pending sensitivity availability.   PLAN:  1. Continue vancomycin 2. Therapeutic drug monitoring of renal function and vancomycin levels 3. Wound care per CVTS.  Principal Problem:   Staph aureus infection Active Problems:   Complication involving left ventricular assist device (LVAD)   Abscess of sternal region   . sodium chloride   Intravenous Once  . acetaminophen  1,000 mg Oral Q6H   Or  . acetaminophen (TYLENOL) oral liquid 160 mg/5 mL  1,000 mg Oral Q6H  . aspirin EC  81 mg Oral Daily  . atorvastatin  20 mg Oral Daily  . bisacodyl  10 mg Oral Daily  . DULoxetine  60 mg Oral Daily  . fluticasone  1 spray Each Nare Daily  . insulin aspart  0-15 Units Subcutaneous TID WC  . insulin aspart  0-5 Units Subcutaneous QHS  . pantoprazole  40 mg Oral Daily  . potassium chloride  40 mEq Oral Daily  . sacubitril-valsartan  1 tablet Oral BID  . senna-docusate  1 tablet Oral QHS  . sodium chloride flush  3 mL Intravenous Q12H  . spironolactone  25 mg Oral Daily  . thiamine  100 mg Oral Daily  . vitamin B-12  1,000 mcg Oral Daily  . zinc sulfate  220 mg Oral Daily    SUBJECTIVE:  Afebrile overnight with no acute events. Cultures growing staphylococcus aureus with sensitivities remaining pending.   Allergies  Allergen Reactions  . Chlorhexidine Rash  . Other Rash    Prep pads     Review of Systems: Review of Systems  Constitutional: Negative for chills, fever and weight loss.  Respiratory: Negative for cough,  shortness of breath and wheezing.   Cardiovascular: Negative for chest pain and leg swelling.  Gastrointestinal: Negative for abdominal pain, constipation, diarrhea, nausea and vomiting.  Skin: Negative for rash.      OBJECTIVE: Vitals:   01/28/20 0327 01/28/20 0400 01/28/20 0621 01/28/20 0836  BP: (!) 89/73 92/82  (!) 93/54  Pulse:    80  Resp:  19  20  Temp: 97.8 F (36.6 C)   98.1 F (36.7 C)  TempSrc: Oral   Oral  SpO2:  98%    Weight:   81.1 kg   Height:       Body mass index is 24.94 kg/m.  Physical Exam Constitutional:      General: He is not in acute distress.    Appearance: He is well-developed.  Cardiovascular:     Heart sounds: Normal heart sounds.     Comments: LVAD hum Pulmonary:     Effort: Pulmonary effort is normal.     Breath sounds: Normal breath sounds.  Chest:     Comments: Dressing clean and dry. VAC in place.  Skin:    General: Skin is warm and dry.  Neurological:     Mental Status: He is alert and oriented to person, place, and time.  Psychiatric:        Behavior: Behavior normal.        Thought Content: Thought content  normal.        Judgment: Judgment normal.     Lab Results Lab Results  Component Value Date   WBC 10.9 (H) 01/28/2020   HGB 10.5 (L) 01/28/2020   HCT 31.3 (L) 01/28/2020   MCV 92.6 01/28/2020   PLT 311 01/28/2020    Lab Results  Component Value Date   CREATININE 0.71 01/28/2020   BUN 5 (L) 01/28/2020   NA 140 01/28/2020   K 3.5 01/28/2020   CL 108 01/28/2020   CO2 24 01/28/2020    Lab Results  Component Value Date   ALT 14 01/28/2020   AST 17 01/28/2020   ALKPHOS 64 01/28/2020   BILITOT 0.8 01/28/2020     Microbiology: Recent Results (from the past 240 hour(s))  Culture, blood (Routine X 2) w Reflex to ID Panel     Status: None (Preliminary result)   Collection Time: 01/25/20 11:00 AM   Specimen: BLOOD LEFT WRIST  Result Value Ref Range Status   Specimen Description BLOOD LEFT WRIST  Final    Special Requests   Final    BOTTLES DRAWN AEROBIC AND ANAEROBIC Blood Culture adequate volume   Culture   Final    NO GROWTH 3 DAYS Performed at [facility], [address] [place], [place] [address]    Report Status PENDING  Incomplete  Culture, blood (routine x 2)     Status: None (Preliminary result)   Collection Time: 01/25/20 12:59 PM   Specimen: BLOOD RIGHT WRIST  Result Value Ref Range Status   Specimen Description BLOOD RIGHT WRIST  Final   Special Requests   Final    BOTTLES DRAWN AEROBIC AND ANAEROBIC Blood Culture adequate volume   Culture   Final    NO GROWTH 3 DAYS Performed at [facility], [address] [place], [place] [address]    Report Status PENDING  Incomplete  Aerobic Culture (superficial specimen)     Status: None   Collection Time: 01/25/20  3:14 PM   Specimen: Wound  Result Value Ref Range Status   Specimen Description WOUND CHEST  Final   Special Requests NONE  Final   Gram Stain   Final    MODERATE WBC PRESENT, PREDOMINANTLY PMN RARE GRAM POSITIVE COCCI Performed at [facility], [address], [place], [place] [address]    Culture FEW STAPHYLOCOCCUS AUREUS  Final   Report Status 01/28/2020 FINAL  Final   Organism ID, Bacteria STAPHYLOCOCCUS AUREUS  Final      Susceptibility   Staphylococcus aureus - MIC*    CIPROFLOXACIN <=0.5 SENSITIVE Sensitive     ERYTHROMYCIN >=8 RESISTANT Resistant     GENTAMICIN <=0.5 SENSITIVE Sensitive     OXACILLIN 0.5 SENSITIVE Sensitive     TETRACYCLINE 2 SENSITIVE Sensitive     VANCOMYCIN 1 SENSITIVE Sensitive     TRIMETH/SULFA <=10 SENSITIVE Sensitive     CLINDAMYCIN RESISTANT Resistant     RIFAMPIN <=0.5 SENSITIVE Sensitive     Inducible Clindamycin POSITIVE Resistant     * FEW STAPHYLOCOCCUS AUREUS  SARS CORONAVIRUS 2 (TAT 6-24 HRS) Nasopharyngeal Nasopharyngeal Swab     Status: None   Collection Time: 01/25/20  9:40 PM   Specimen: Nasopharyngeal Swab  Result Value Ref Range Status    SARS Coronavirus 2 NEGATIVE NEGATIVE Final    Comment: (NOTE) SARS-CoV-2 target nucleic acids are NOT DETECTED. The SARS-CoV-2 RNA is generally detectable in upper and lower respiratory specimens during the acute phase  of infection. Negative results do not preclude SARS-CoV-2 infection, do not rule out co-infections with other pathogens, and should not be used as the sole basis for treatment or other patient management decisions. Negative results must be combined with clinical observations, patient history, and epidemiological information. The expected result is Negative. Fact Sheet for Patients: SugarRoll.be Fact Sheet for Healthcare Providers: https://www.woods-mathews.com/ This test is not yet approved or cleared by the [place] FDA and  has been authorized for detection and/or diagnosis of SARS-CoV-2 by FDA under an Emergency Use Authorization (EUA). This EUA will remain  in effect (meaning this test can be used) for the duration of the COVID-19 declaration under Section 56 4(b)(1) of the Act, 21 U.S.C. section 360bbb-3(b)(1), unless the authorization is terminated or revoked sooner. Performed at [facility], [place] [address], [place], [place] [address]   MRSA PCR Screening     Status: None   Collection Time: 01/25/20  9:46 PM   Specimen: Nasal Mucosa; Nasopharyngeal  Result Value Ref Range Status   MRSA by PCR NEGATIVE NEGATIVE Final    Comment:        The GeneXpert MRSA Assay (FDA approved for NASAL specimens only), is one component of a comprehensive MRSA colonization surveillance program. It is not intended to diagnose MRSA infection nor to guide or monitor treatment for MRSA infections. Performed at [facility], [place] [address], [place], [place] [address]   Aerobic/Anaerobic Culture (surgical/deep wound)     Status: None (Preliminary result)   Collection Time: 01/26/20  9:15 AM   Specimen: PATH Soft tissue   Result Value Ref Range Status   Specimen Description TISSUE STERNAL WOUND IRRIGATION AND [place]  Final   Special Requests A  Final   Gram Stain   Final    FEW WBC PRESENT, PREDOMINANTLY PMN NO ORGANISMS SEEN    Culture   Final    FEW STAPHYLOCOCCUS AUREUS SUSCEPTIBILITIES TO FOLLOW Performed at [facility], [place] [address], [place], [place] [address]    Report Status PENDING  Incomplete  Acid Fast Smear (AFB)     Status: None   Collection Time: 01/26/20  9:15 AM   Specimen: PATH Soft tissue  Result Value Ref Range Status   AFB Specimen Processing Concentration  Final   Acid Fast Smear Negative  Final    Comment: (NOTE) Performed At: [facility] [address] [place], [place] [id number] Rush Farmer MD ST:[id number]    Source (AFB) TISSUE  Final    Comment: STERNAL WOUND IRRIGATION AND DEBRIDGEMENTTIS STERNAL WOUND IRRIGATION AND [place], NP [facility]  01/28/2020  10:36 AM

## 2020-01-28 NOTE — Plan of Care (Signed)

## 2020-01-28 NOTE — Progress Notes (Signed)
Patient ID: Theodore Welch, male   DOB: [birth year], 59 y.o.   MRN: [id number] P    Advanced Heart Failure Rounding Note  PCP-Cardiologist: No primary care provider on file.   Subjective:    To OR 3/10, lower substernal abscess I&D, did not visualize LVAD hardware but able to palpate outflow graft.  Driveline site also with I&D.  Now with wound vacs to lower sternal site and driveline site.  Wound vacs occluded yesterday, had to replace tubing.  Looks like occluded again this morning and alarming.   WBC trending down 21>16>10.9. Afebrile.  He has been seen by ID, now on vancomycin alone.  Wound culture with S aureus, awaiting sensitivities.   MAP appears stable 70s-80s on current regimen.   Feeling better and has more of an appetite.    CT abdomen: subxiphoid fluid collection with superficial and deep components, collection is along the course of the driveline. Cellulitis at surface, no evidence for sternal osteomyelitis.   LVAD: Heartmate 3. Flow 5.1 L/min, 5700 rpm, PI 2.1, power 4.4.  Multiple PI events this morning.  LDH stable at 130.    Objective:   Weight Range: 81.1 kg Body mass index is 24.94 kg/m.   Vital Signs:   Temp:  [97.8 F (36.6 C)-98.7 F (37.1 C)] 98.1 F (36.7 C) (03/12 0836) Pulse Rate:  [75-96] 80 (03/12 0836) Resp:  [18-20] 20 (03/12 0836) BP: (86-101)/(54-82) 93/54 (03/12 0836) SpO2:  [95 %-99 %] 98 % (03/12 0400) Weight:  [81.1 kg] 81.1 kg (03/12 0621) Last BM Date: 01/27/20  Weight change: Filed Weights   01/26/20 0543 01/27/20 0355 01/28/20 0621  Weight: 79.3 kg 80.8 kg 81.1 kg    Intake/Output:   Intake/Output Summary (Last 24 hours) at 01/28/2020 0909 Last data filed at 01/28/2020 0700 Gross per 24 hour  Intake 1537.65 ml  Output 1400 ml  Net 137.65 ml      Physical Exam    General: Well appearing this am. NAD.  HEENT: Normal. Neck: Supple, JVP 7-8 cm. Carotids OK.  Cardiac:  Mechanical heart sounds with LVAD hum present.  Lungs:   CTAB, normal effort.  Abdomen:  NT, ND, no HSM. No bruits or masses. +BS  LVAD exit site: Wound vacs x 2 now on abdomen, 1 at driveline exit site.  Extremities:  Warm and dry. No cyanosis, clubbing, rash, or edema.  Neuro:  Alert & oriented x 3. Cranial nerves grossly intact. Moves all 4 extremities w/o difficulty. Affect pleasant    Telemetry  SR 80s -90s   Labs    CBC Recent Labs    01/27/20 0229 01/28/20 0605  WBC 15.9* 10.9*  HGB 11.1* 10.5*  HCT 32.9* 31.3*  MCV 90.1 92.6  PLT 300 311   Basic Metabolic Panel Recent Labs    78/46/96 0229 01/28/20 0605  NA 138 140  K 3.2* 3.5  CL 103 108  CO2 24 24  GLUCOSE 104* 91  BUN 7 5*  CREATININE 0.89 0.71  CALCIUM 9.0 8.8*   Liver Function Tests Recent Labs    01/26/20 0442 01/28/20 0605  AST 19 17  ALT 20 14  ALKPHOS 92 64  BILITOT 1.5* 0.8  PROT 7.3 6.1*  ALBUMIN 3.0* 2.6*   No results for input(s): LIPASE, AMYLASE in the last 72 hours. Cardiac Enzymes No results for input(s): CKTOTAL, CKMB, CKMBINDEX, TROPONINI in the last 72 hours.  BNP: BNP (last 3 results) No results for input(s): BNP in the last 8760 hours.  ProBNP (last 3 results) No results for input(s): PROBNP in the last 8760 hours.   D-Dimer No results for input(s): DDIMER in the last 72 hours. Hemoglobin A1C Recent Labs    01/26/20 0830  HGBA1C 5.5   Fasting Lipid Panel No results for input(s): CHOL, HDL, LDLCALC, TRIG, CHOLHDL, LDLDIRECT in the last 72 hours. Thyroid Function Tests No results for input(s): TSH, T4TOTAL, T3FREE, THYROIDAB in the last 72 hours.  Invalid input(s): FREET3  Other results:   Imaging    No results found.   Medications:     Scheduled Medications: . sodium chloride   Intravenous Once  . acetaminophen  1,000 mg Oral Q6H   Or  . acetaminophen (TYLENOL) oral liquid 160 mg/5 mL  1,000 mg Oral Q6H  . aspirin EC  81 mg Oral Daily  . atorvastatin  20 mg Oral Daily  . bisacodyl  10 mg Oral Daily  .  DULoxetine  60 mg Oral Daily  . fluticasone  1 spray Each Nare Daily  . insulin aspart  0-15 Units Subcutaneous TID WC  . insulin aspart  0-5 Units Subcutaneous QHS  . pantoprazole  40 mg Oral Daily  . potassium chloride  40 mEq Oral Daily  . sacubitril-valsartan  1 tablet Oral BID  . senna-docusate  1 tablet Oral QHS  . sodium chloride flush  3 mL Intravenous Q12H  . spironolactone  25 mg Oral Daily  . thiamine  100 mg Oral Daily  . vitamin B-12  1,000 mcg Oral Daily  . zinc sulfate  220 mg Oral Daily    Infusions: . sodium chloride    . sodium chloride    . sodium chloride 10 mL/hr at 01/26/20 0300  . vancomycin Stopped (01/28/20 0454)    PRN Medications: sodium chloride, sodium chloride, acetaminophen, docusate sodium, fentaNYL (SUBLIMAZE) injection, hydrALAZINE, hydrOXYzine, ipratropium-albuterol, methocarbamol, ondansetron (ZOFRAN) IV, ondansetron (ZOFRAN) IV, oxyCODONE, polyethylene glycol, sodium chloride flush, traMADol, zolpidem   Assessment/Plan   1. Subxiphoid abscess: With surrounding cellulitis.  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  CT abdomen showed subxiphoid fluid collection with superficial and deep components, collection is along the course of the driveline. Cellulitis at surface, no evidence for sternal osteomyelitis. S/P I&D 3/10, did not visualize LVAD hardware but able to palpate outflow graft.  Driveline site also with I&D.  Now with wound vacs to lower sternal site and driveline site.  Wound vac with alarm for occlusion this morning. Now afebrile with WBCs trending down. S aureus from wound culture awaiting sensitivities. Echo this admission did not show evidence for vegetations but technically difficult.  - Wound vacs may need tubing changed again versus replacement, surgery following.  - Back to OR for relook on Monday.  - Continue vancomycin pending S aureus sensitivities.  - ID following.  Very  concerning infection, especially given prior MSSA driveline infection and already treating with suppressive Keflex. There is a possibility that this could lead to the need for pump exchange, at the least he will need extended IV treatment.  - So far blood cultures negative.  If become positive with Staph, will need TEE.  2. 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  NYHA class I-II at baseline.  Volume status stable.  MAP stable.  More PIs than baseline this morning.   - Encourage po intake today, starting to eat better.  - Continue spironolactone 25 mg daily.  -  Continue Entresto, dose decreased to 49/51 with infection.    - Amlodipine has been held.  - Continue ASA 81 daily. - Warfarin with goal INR 2-2.5, now on hold for OR (INR 2 today).  Start heparin gtt when INR < 2.  - Should be transplant candidate eventually if he can quit smoking.  Will make transplant clinic referral when he is off totally.  3. Smoking: Cutting back.   4. H/o RLE DVT: Has been on warfarin for LVAD.  5. OSA: Continue CPAP.  6. Hyperlipidemia: Atorvastatin.  7. Type II diabetes: SSI while inpatient.    Ambulate.  Length of Stay: 3  Loralie Champagne, MD  01/28/2020, 9:09 AM  Advanced Heart Failure Team Pager [phone] (M-F; 7a - 4p)  Please contact [facility] for night-coverage after hours (4p -7a ) and weekends on [email or web address]

## 2020-01-28 NOTE — Progress Notes (Addendum)
LVAD Coordinator Rounding Note:  Admitted 01/25/20 per Dr. Shirlee Latch with new onset sternal abscess.   HM III LVAD implanted on 08/03/18 by Dr. Dr. Laneta Simmers under Destination Therapy criteria due to smoking status.   Pt lying in bed. Dr Donata Clay at bedside removing both drive line and sternal wound vacs due to occlusions. See dressing change note below.   DDAVP given per Dr Donata Clay for bleeding associated with wound vacs.   Plan to return to OR for washout with Dr Donata Clay on Monday 01/31/20 at 1515.   Dr Shirlee Latch aware of high number of PI events. Will give 500 cc NS bolus. (100cc per hour for 5 hours) Pt's bedside RN aware. (ordered at 1330)  Vital signs: Temp: 97.9 HR: 87 Doppler Pressure: 78 Automatic BP: 93/80 (86) O2 Sat: 98% RA Wt: 187.4>174.8>178>178.7  lbs   LVAD interrogation reveals:  Speed: 5700 Flow: 4.8 Power:  4.3w PI: 2.8 Alarms: none  Events:  > 120 PI events today Hematocrit: 31  Fixed speed:  5700 Low speed limit: 5400  Drive Line:  Both wound vacs removed by Dr Donata Clay using sterile technique. Both sites packed with MRDH and saline soaked gauze. Topped with dry gauze. Daily wet to dry dressing changes leaving MRDH in wound bed. Per Dr Donata Clay PA on call this weekend will change both dressings. Bedside RN allowed to replaced top layer of dry gauze if becomes saturated. Anchor intact and accurately applied. Picture below is of sternal wound.       Labs:  LDH trend: 388>875>797  INR trend: 3.2>2.2>2.7  Anticoagulation Plan: -INR Goal: 2.0 - 2.5 -ASA Dose: 81 mg daily  Device: N/A  Infection:  - 01/25/20 BCs>>pending - 01/25/20 sternal wound culture>>gm positive cocci - 01/26/20 intra-op sternal wound cultures>>pending  Plan/Recommendations:  1. Call VAD Coordinator if any VAD or drive line issues.  Alyce Pagan RN VAD Coordinator  Office: [phone]  24/7 Pager: [phone]

## 2020-01-28 NOTE — Progress Notes (Addendum)
2 Days Post-Op Procedure(s) (LRB): STERNAL WOUND & driveline IRRIGATION AND DEBRIDEMENT (N/A) APPLICATION OF WOUND VAC (N/A) Subjective: Bleeding at both debridement sites has precluded the effectiveness of wound VAC therapy.  Today under sterile technique both wound VAC sponges were removed and daily separate wet-to-dry dressing changes was initiated.  The sterile wound wet-to-dry dressing changes will be performed by the surgical team or if not available then the VAD coordinator on call and patient care plan/ efforts have been coordinated between the two providers.  Both wounds look clean with a fairly good granulation base. Objective: Vital signs in last 24 hours: Temp:  [97.8 F (36.6 C)-98.7 F (37.1 C)] 98.1 F (36.7 C) (03/12 0836) Pulse Rate:  [75-96] 80 (03/12 0836) Cardiac Rhythm: Normal sinus rhythm (03/12 0836) Resp:  [18-20] 20 (03/12 0836) BP: (86-101)/(54-82) 93/54 (03/12 0836) SpO2:  [95 %-99 %] 98 % (03/12 0400) Weight:  [81.1 kg] 81.1 kg (03/12 0621)  Hemodynamic parameters for last 24 hours:  Stable Intake/Output from previous day: 03/11 0701 - 03/12 0700 In: 1537.7 [P.O.:490; I.V.:47.7; IV Piggyback:1000] Out: 1400 [Urine:1350; Drains:50] Intake/Output this shift: No intake/output data recorded.  Exam Bleeding from the subcutaneous fat at the power cord exit site stopped with [facility] topical hemostatic pad and wet-to-dry dressing.  Normal VAD HUM  Lab Results: Recent Labs    01/27/20 0229 01/28/20 0605  WBC 15.9* 10.9*  HGB 11.1* 10.5*  HCT 32.9* 31.3*  PLT 300 311   BMET:  Recent Labs    01/27/20 0229 01/28/20 0605  NA 138 140  K 3.2* 3.5  CL 103 108  CO2 24 24  GLUCOSE 104* 91  BUN 7 5*  CREATININE 0.89 0.71  CALCIUM 9.0 8.8*    PT/INR:  Recent Labs    01/28/20 0652  LABPROT 22.6*  INR 2.0*   ABG    Component Value Date/Time   PHART 7.625 (HH) 01/25/2020 2108   HCO3 23.6 01/25/2020 2108   TCO2 26 08/04/2018 1622   O2SAT 97.9  01/25/2020 2108   CBG (last 3)  Recent Labs    01/27/20 1604 01/27/20 2214 01/28/20 0618  GLUCAP 97 121* 85    Assessment/Plan: S/P Procedure(s) (LRB): STERNAL WOUND & driveline IRRIGATION AND DEBRIDEMENT (N/A) APPLICATION OF WOUND VAC (N/A) Hold heparin today because of bleeding from wound Because patient will be returning to the OR hold Coumadin ----  until more definitive plans are made regarding discharge versus muscle flap reconstruction of the wound.   LOS: 3 days    Theodore Welch 01/28/2020

## 2020-01-28 NOTE — Plan of Care (Signed)
  Problem: Education: Goal: Knowledge of General Education information will improve Description: Including pain rating scale, medication(s)/side effects and non-pharmacologic comfort measures 01/28/2020 2306 by Reynold Bowen, RN Outcome: Progressing 01/28/2020 2054 by Reynold Bowen, RN Outcome: Progressing   Problem: Education: Goal: Knowledge of General Education information will improve Description: Including pain rating scale, medication(s)/side effects and non-pharmacologic comfort measures 01/28/2020 2306 by Reynold Bowen, RN Outcome: Progressing 01/28/2020 2054 by Reynold Bowen, RN Outcome: Progressing   Problem: Health Behavior/Discharge Planning: Goal: Ability to manage health-related needs will improve 01/28/2020 2306 by Reynold Bowen, RN Outcome: Progressing 01/28/2020 2054 by Reynold Bowen, RN Outcome: Progressing   Problem: Clinical Measurements: Goal: Ability to maintain clinical measurements within normal limits will improve 01/28/2020 2306 by Reynold Bowen, RN Outcome: Progressing 01/28/2020 2054 by Reynold Bowen, RN Outcome: Progressing Goal: Will remain free from infection 01/28/2020 2306 by Reynold Bowen, RN Outcome: Progressing 01/28/2020 2054 by Reynold Bowen, RN Outcome: Progressing Goal: Diagnostic test results will improve 01/28/2020 2306 by Reynold Bowen, RN Outcome: Progressing 01/28/2020 2054 by Reynold Bowen, RN Outcome: Progressing Goal: Respiratory complications will improve 01/28/2020 2306 by Reynold Bowen, RN Outcome: Progressing 01/28/2020 2054 by Reynold Bowen, RN Outcome: Progressing Goal: Cardiovascular complication will be avoided 01/28/2020 2306 by Reynold Bowen, RN Outcome: Progressing 01/28/2020 2054 by Reynold Bowen, RN Outcome: Progressing   Problem: Activity: Goal: Risk for activity intolerance will decrease 01/28/2020 2306 by Reynold Bowen, RN Outcome: Progressing 01/28/2020 2054 by  Reynold Bowen, RN Outcome: Progressing   Problem: Nutrition: Goal: Adequate nutrition will be maintained 01/28/2020 2306 by Reynold Bowen, RN Outcome: Progressing 01/28/2020 2054 by Reynold Bowen, RN Outcome: Progressing   Problem: Coping: Goal: Level of anxiety will decrease 01/28/2020 2306 by Reynold Bowen, RN Outcome: Progressing 01/28/2020 2054 by Reynold Bowen, RN Outcome: Progressing   Problem: Elimination: Goal: Will not experience complications related to bowel motility 01/28/2020 2306 by Reynold Bowen, RN Outcome: Progressing 01/28/2020 2054 by Reynold Bowen, RN Outcome: Progressing Goal: Will not experience complications related to urinary retention 01/28/2020 2306 by Reynold Bowen, RN Outcome: Progressing 01/28/2020 2054 by Reynold Bowen, RN Outcome: Progressing   Problem: Pain Managment: Goal: General experience of comfort will improve 01/28/2020 2306 by Reynold Bowen, RN Outcome: Progressing 01/28/2020 2054 by Reynold Bowen, RN Outcome: Progressing   Problem: Safety: Goal: Ability to remain free from injury will improve 01/28/2020 2306 by Reynold Bowen, RN Outcome: Progressing 01/28/2020 2054 by Reynold Bowen, RN Outcome: Progressing   Problem: Skin Integrity: Goal: Risk for impaired skin integrity will decrease 01/28/2020 2306 by Reynold Bowen, RN Outcome: Progressing 01/28/2020 2054 by Reynold Bowen, RN Outcome: Progressing

## 2020-01-28 NOTE — Progress Notes (Addendum)
Pt fell while ambulating in hall with NT. Pt stated that his flip flop folded under and caused him to trip and fall. Pt stated that he fell on his hands and knees and did not hit his head. VAD coordinator paged x2 with no response. On call MD paged. No new orders placed at this time. Pt stated that he is fine and does not have any pain. Will continue to monitor.

## 2020-01-29 DIAGNOSIS — A4901 Methicillin susceptible Staphylococcus aureus infection, unspecified site: Secondary | ICD-10-CM

## 2020-01-29 LAB — GLUCOSE, CAPILLARY
Glucose-Capillary: 85 mg/dL (ref 70–99)
Glucose-Capillary: 79 mg/dL (ref 70–99)
Glucose-Capillary: 108 mg/dL — ABNORMAL HIGH (ref 70–99)
Glucose-Capillary: 96 mg/dL (ref 70–99)

## 2020-01-29 LAB — CBC
HCT: 27.1 % — ABNORMAL LOW (ref 39.0–52.0)
Hemoglobin: 9 g/dL — ABNORMAL LOW (ref 13.0–17.0)
MCH: 30.9 pg (ref 26.0–34.0)
MCHC: 33.2 g/dL (ref 30.0–36.0)
MCV: 93.1 fL (ref 80.0–100.0)
Platelets: 313 10*3/uL (ref 150–400)
RBC: 2.91 MIL/uL — ABNORMAL LOW (ref 4.22–5.81)
RDW: 12.5 % (ref 11.5–15.5)
WBC: 10.2 10*3/uL (ref 4.0–10.5)
nRBC: 0 % (ref 0.0–0.2)

## 2020-01-29 LAB — LACTATE DEHYDROGENASE: LDH: 136 U/L (ref 98–192)

## 2020-01-29 LAB — BASIC METABOLIC PANEL
Anion gap: 10 (ref 5–15)
BUN: 6 mg/dL (ref 6–20)
CO2: 23 mmol/L (ref 22–32)
Calcium: 8.5 mg/dL — ABNORMAL LOW (ref 8.9–10.3)
Chloride: 104 mmol/L (ref 98–111)
Creatinine, Ser: 0.72 mg/dL (ref 0.61–1.24)
GFR calc Af Amer: >60 mL/min (ref >60)
GFR calc non Af Amer: >60 mL/min (ref >60)
Glucose, Bld: 92 mg/dL (ref 70–99)
Potassium: 3.5 mmol/L (ref 3.5–5.1)
Sodium: 137 mmol/L (ref 135–145)

## 2020-01-29 LAB — HEPARIN LEVEL (UNFRACTIONATED): Heparin Unfractionated: <0.1 IU/mL — ABNORMAL LOW (ref 0.30–0.70)

## 2020-01-29 LAB — PROTIME-INR
INR: 1.7 — ABNORMAL HIGH (ref 0.8–1.2)
Prothrombin Time: 19.7 seconds — ABNORMAL HIGH (ref 11.4–15.2)

## 2020-01-29 MED ORDER — POTASSIUM CHLORIDE CRYS ER 20 MEQ PO TBCR
20.0000 meq | EXTENDED_RELEASE_TABLET | Freq: Once | ORAL | Status: AC
  Administered 2020-01-29: 20 meq via ORAL
  Filled 2020-01-29: qty 1

## 2020-01-29 MED ORDER — HEPARIN (PORCINE) 25000 UT/250ML-% IV SOLN
600.0000 [IU]/h | INTRAVENOUS | Status: DC
  Administered 2020-01-29: 500 [IU]/h via INTRAVENOUS
  Administered 2020-01-31: 600 [IU]/h via INTRAVENOUS
  Filled 2020-01-29 (×2): qty 250

## 2020-01-29 NOTE — Progress Notes (Signed)
ANTICOAGULATION CONSULT NOTE  Pharmacy Consult for Heparin while Coumadin on hold Indication: LVAD  Allergies  Allergen Reactions  . Chlorhexidine Rash  . Other Rash    Prep pads    Patient Measurements: Height: 5\' 11"  (180.3 cm) Weight: 185 lb 6.5 oz (84.1 kg) IBW/kg (Calculated) : 75.3 Heparin Dosing Weight: 85 kg  Vital Signs: Temp: 98.1 F (36.7 C) (03/13 1613) Temp Source: Oral (03/13 1613) BP: 93/78 (03/13 1613) Pulse Rate: 76 (03/13 1613)  Labs: Recent Labs    01/27/20 0229 01/27/20 0229 01/28/20 0605 01/28/20 0652 01/29/20 0400 01/29/20 1510  HGB 11.1*   < > 10.5*  --  9.0*  --   HCT 32.9*  --  31.3*  --  27.1*  --   PLT 300  --  311  --  313  --   LABPROT 29.0*  --   --  22.6* 19.7*  --   INR 2.7*  --   --  2.0* 1.7*  --   HEPARINUNFRC  --   --   --   --   --  <0.10*  CREATININE 0.89  --  0.71  --  0.72  --    < > = values in this interval not displayed.    Estimated Creatinine Clearance: 104.6 mL/min (by C-G formula based on SCr of 0.72 mg/dL).   Medical History: Past Medical History:  Diagnosis Date  . 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 Cardiomyopathy, unspecified (HCC)   . CHF (congestive heart failure) (HCC)   . Chronic back pain   . Diabetes mellitus without complication (HCC)   . Enlarged heart   . Gastric ulcer   . Gastroenteritis   . H/O degenerative disc disease   . Hypertension   . LVAD (left ventricular assist device) present [facility])     Assessment: 58yom presenting with concern of large sternal abscess- starting on antibiotics. On warfarin PTA for hx LVAD (goal 2-2.5).  PTA regimen is warfarin 5 mg daily except 7.5 mg TTS.   Having bleeding from the wounds, s/p DDAVP 3/12.  Hgb trending down.  Pharmacy asked to start IV heparin while INR below 2. Heparin drip 500 uts/hr  HL < 0.1 - no titration for now per Dr 5/12 for possible need further debridement  Checking HL to ensure not > 0.3   Goal of Therapy:  Discussed  with Drs. McLean/VanTrigt > will not adjust heparin for now. INR 2-2.5 Monitor platelets by anticoagulation protocol: Yes   Plan:  Continue  IV heparin now at 500 units/hr. Check heparin level daily to ensure not > 0.3 and CBC. Will f/u ability to titrate heparin to therapeutic level depending on bleeding and surgery plans  F/u plans to resume Coumadin after OR on Monday.   Saturday Pharm.D. CPP, BCPS Clinical Pharmacist [phone] 01/29/2020 6:57 PM

## 2020-01-29 NOTE — Plan of Care (Signed)
?  Problem: Education: ?Goal: Knowledge of General Education information will improve ?Description: Including pain rating scale, medication(s)/side effects and non-pharmacologic comfort measures ?Outcome: Progressing ?  ?Problem: Health Behavior/Discharge Planning: ?Goal: Ability to manage health-related needs will improve ?Outcome: Progressing ?  ?Problem: Clinical Measurements: ?Goal: Ability to maintain clinical measurements within normal limits will improve ?Outcome: Progressing ?  ?Problem: Activity: ?Goal: Risk for activity intolerance will decrease ?Outcome: Progressing ?  ?Problem: Coping: ?Goal: Level of anxiety will decrease ?Outcome: Progressing ?  ?Problem: Pain Managment: ?Goal: General experience of comfort will improve ?Outcome: Progressing ?  ?

## 2020-01-29 NOTE — Progress Notes (Addendum)
ANTICOAGULATION CONSULT NOTE  Pharmacy Consult for Heparin while Coumadin on hold Indication: LVAD  Allergies  Allergen Reactions  . Chlorhexidine Rash  . Other Rash    Prep pads    Patient Measurements: Height: 5\' 11"  (180.3 cm) Weight: 185 lb 6.5 oz (84.1 kg) IBW/kg (Calculated) : 75.3 Heparin Dosing Weight: 85 kg  Vital Signs: Temp: 98.1 F (36.7 C) (03/13 0710) Temp Source: Oral (03/13 0710) BP: 97/86 (03/13 0710) Pulse Rate: 79 (03/13 0710)  Labs: Recent Labs    01/27/20 0229 01/27/20 0229 01/28/20 0605 01/28/20 0652 01/29/20 0400  HGB 11.1*   < > 10.5*  --  9.0*  HCT 32.9*  --  31.3*  --  27.1*  PLT 300  --  311  --  313  LABPROT 29.0*  --   --  22.6* 19.7*  INR 2.7*  --   --  2.0* 1.7*  CREATININE 0.89  --  0.71  --  0.72   < > = values in this interval not displayed.    Estimated Creatinine Clearance: 104.6 mL/min (by C-G formula based on SCr of 0.72 mg/dL).   Medical History: Past Medical History:  Diagnosis Date  . 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 Cardiomyopathy, unspecified (HCC)   . CHF (congestive heart failure) (HCC)   . Chronic back pain   . Diabetes mellitus without complication (HCC)   . Enlarged heart   . Gastric ulcer   . Gastroenteritis   . H/O degenerative disc disease   . Hypertension   . LVAD (left ventricular assist device) present [facility])     Assessment: 58yom presenting with concern of large sternal abscess- starting on antibiotics. On warfarin PTA for hx LVAD (goal 2-2.5).  PTA regimen is warfarin 5 mg daily except 7.5 mg TTS.   Having bleeding from the wounds, s/p DDAVP 3/12.  Hgb trending down.  Pharmacy asked to start IV heparin while INR below 2.   Goal of Therapy:  Discussed with Drs. McLean/VanTrigt > will not adjust heparin for now. INR 2-2.5 Monitor platelets by anticoagulation protocol: Yes   Plan:  Start IV heparin now at 500 units/hr. Check heparin level in 6 hrs to ensure not > 0.3. Daily heparin  level and CBC. Will f/u ability to titrate heparin to therapeutic level depending on bleeding. F/u plans to resume Coumadin after OR on Monday.  Monday, [facility] Clinical Pharmacist Phone [phone]  01/29/2020 8:16 AM

## 2020-01-29 NOTE — Progress Notes (Signed)
The lower drive line wound dressing was removed. The site was clean and  hemostatic.  The MRDH hemostatic dressing material was left in place. Using sterile technique, the surrounding skin was painted with betadine then the open wound was packed with new saline-moistened 4x4's then covered with a dry dressing and tape.   Gaynelle Arabian, PA-C [phone]

## 2020-01-29 NOTE — Progress Notes (Addendum)
Patient ID: Theodore Welch, male   DOB: [birth year], 58 y.o.   MRN: [id number] P    Advanced Heart Failure Rounding Note  PCP-Cardiologist: No primary care provider on file.   Subjective:    To OR 3/10, lower substernal abscess I&D, did not visualize LVAD hardware but able to palpate outflow graft.  Driveline site also with I&D.  Wound vacs placed lower sternal site and driveline site, now removed due to recurrent occlusion and wounds dressed.   Tripped on broken flip flop last night and fell, no injury and driveline was not pulled.   WBC trending down 21>16>10.9>10. Afebrile.  He has been seen by ID, now on cefazolin. Wound culture with MSSA.   MAP appears stable 80s on current regimen.   Eating and drinking better.  Less PI events today though still frequent.  Got IV fluid yesterday.    CT abdomen: subxiphoid fluid collection with superficial and deep components, collection is along the course of the driveline. Cellulitis at surface, no evidence for sternal osteomyelitis.   LVAD: Heartmate 3. Flow 5.0 L/min, 5700 rpm, PI 3.2, power 4.4.  Multiple PI events this morning but fewer than yesterday.  LDH stable at 136.    Objective:   Weight Range: 84.1 kg Body mass index is 25.86 kg/m.   Vital Signs:   Temp:  [97.8 F (36.6 C)-98.2 F (36.8 C)] 98.1 F (36.7 C) (03/13 0710) Pulse Rate:  [74-80] 79 (03/13 0710) Resp:  [12-25] 20 (03/13 0710) BP: (85-97)/(54-86) 97/86 (03/13 0710) SpO2:  [96 %-100 %] 100 % (03/13 0331) Weight:  [84.1 kg] 84.1 kg (03/13 0331) Last BM Date: 01/28/20  Weight change: Filed Weights   01/27/20 0355 01/28/20 0621 01/29/20 0331  Weight: 80.8 kg 81.1 kg 84.1 kg    Intake/Output:   Intake/Output Summary (Last 24 hours) at 01/29/2020 0811 Last data filed at 01/29/2020 0400 Gross per 24 hour  Intake 1102.15 ml  Output --  Net 1102.15 ml      Physical Exam    General: Well appearing this am. NAD.  HEENT: Normal. Neck: Supple, JVP 7-8 cm.  Carotids OK.  Cardiac:  Mechanical heart sounds with LVAD hum present.  Lungs:  CTAB, normal effort.  Abdomen:  NT, ND, no HSM. No bruits or masses. +BS  LVAD exit site: Well-healed and incorporated. Dressing dry and intact. No erythema or drainage. Stabilization device present and accurately applied. Driveline dressing changed daily per sterile technique. Extremities:  Warm and dry. No cyanosis, clubbing, rash, or edema.  Neuro:  Alert & oriented x 3. Cranial nerves grossly intact. Moves all 4 extremities w/o difficulty. Affect pleasant     Telemetry  SR 80s -90s   Labs    CBC Recent Labs    01/28/20 0605 01/29/20 0400  WBC 10.9* 10.2  HGB 10.5* 9.0*  HCT 31.3* 27.1*  MCV 92.6 93.1  PLT 311 829   Basic Metabolic Panel Recent Labs    01/28/20 0605 01/29/20 0400  NA 140 137  K 3.5 3.5  CL 108 104  CO2 24 23  GLUCOSE 91 92  BUN 5* 6  CREATININE 0.71 0.72  CALCIUM 8.8* 8.5*   Liver Function Tests Recent Labs    01/28/20 0605  AST 17  ALT 14  ALKPHOS 64  BILITOT 0.8  PROT 6.1*  ALBUMIN 2.6*   No results for input(s): LIPASE, AMYLASE in the last 72 hours. Cardiac Enzymes No results for input(s): CKTOTAL, CKMB, CKMBINDEX, TROPONINI in the last  72 hours.  BNP: BNP (last 3 results) No results for input(s): BNP in the last 8760 hours.  ProBNP (last 3 results) No results for input(s): PROBNP in the last 8760 hours.   D-Dimer No results for input(s): DDIMER in the last 72 hours. Hemoglobin A1C Recent Labs    01/26/20 0830  HGBA1C 5.5   Fasting Lipid Panel No results for input(s): CHOL, HDL, LDLCALC, TRIG, CHOLHDL, LDLDIRECT in the last 72 hours. Thyroid Function Tests No results for input(s): TSH, T4TOTAL, T3FREE, THYROIDAB in the last 72 hours.  Invalid input(s): FREET3  Other results:   Imaging    No results found.   Medications:     Scheduled Medications: . sodium chloride   Intravenous Once  . acetaminophen  1,000 mg Oral Q6H   Or    . acetaminophen (TYLENOL) oral liquid 160 mg/5 mL  1,000 mg Oral Q6H  . aspirin EC  81 mg Oral Daily  . atorvastatin  20 mg Oral Daily  . bisacodyl  10 mg Oral Daily  . DULoxetine  60 mg Oral Daily  . fluticasone  1 spray Each Nare Daily  . insulin aspart  0-15 Units Subcutaneous TID WC  . insulin aspart  0-5 Units Subcutaneous QHS  . pantoprazole  40 mg Oral Daily  . potassium chloride  20 mEq Oral Once  . potassium chloride  40 mEq Oral Daily  . sacubitril-valsartan  1 tablet Oral BID  . senna-docusate  1 tablet Oral QHS  . sodium chloride flush  3 mL Intravenous Q12H  . spironolactone  25 mg Oral Daily  . thiamine  100 mg Oral Daily  . vitamin B-12  1,000 mcg Oral Daily  . zinc sulfate  220 mg Oral Daily    Infusions: . sodium chloride 10 mL/hr at 01/28/20 2204  . sodium chloride    . sodium chloride 10 mL/hr at 01/26/20 0300  .  ceFAZolin (ANCEF) IV 2 g (01/29/20 0628)    PRN Medications: sodium chloride, sodium chloride, acetaminophen, docusate sodium, fentaNYL (SUBLIMAZE) injection, hydrALAZINE, hydrOXYzine, ipratropium-albuterol, methocarbamol, ondansetron (ZOFRAN) IV, ondansetron (ZOFRAN) IV, oxyCODONE, polyethylene glycol, sodium chloride flush, traMADol, zolpidem   Assessment/Plan   1. Subxiphoid abscess: With surrounding cellulitis.  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  CT abdomen showed subxiphoid fluid collection with superficial and deep components, collection is along the course of the driveline. Cellulitis at surface, no evidence for sternal osteomyelitis. S/P I&D 3/10, did not visualize LVAD hardware but able to palpate outflow graft.  Driveline site also with I&D.  Wound vacs placed to lower sternal site and driveline site but now removed with recurrent occlusions.  Now afebrile with WBCs down. MSSA from wound culture. Echo this admission did not show evidence for vegetations but technically difficult.   - Back to OR for relook on Monday. It is possible that he could need muscle flap reconstruction eventually.  - Continue Cefazolin x 8 wks.  - ID following.  Very concerning infection, especially given prior MSSA driveline infection and already treating with suppressive Keflex. There is a possibility that this could lead to the need for pump exchange, at the least he will need extended IV treatment.  2. 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  NYHA class I-II at baseline.  Volume status stable.  MAP stable.  Frequent PI events but less today with him eating and drinking better.    -  Continue to encourage po intake.  Think we can hold off on further IV fluid today.  - Continue spironolactone 25 mg daily.  - Continue Entresto, dose decreased to 49/51 with infection.    - Amlodipine has been held.  - Continue ASA 81 daily. - Warfarin with goal INR 2-2.5, now on hold for OR.  INR 1.7 today, will start low dose heparin.  - Should be transplant candidate eventually if he can quit smoking.  Will make transplant clinic referral when he is off totally.  3. Smoking: Cutting back.   4. H/o RLE DVT: Has been on warfarin for LVAD.  5. OSA: Continue CPAP.  6. Hyperlipidemia: Atorvastatin.  7. Type II diabetes: SSI while inpatient.    Ambulate.  Length of Stay: 4  Marca Ancona, MD  01/29/2020, 8:11 AM  Advanced Heart Failure Team Pager [phone] (M-F; 7a - 4p)  Please contact [facility] for night-coverage after hours (4p -7a ) and weekends on [email or web address]

## 2020-01-30 LAB — CBC
HCT: 26.9 % — ABNORMAL LOW (ref 39.0–52.0)
Hemoglobin: 9.1 g/dL — ABNORMAL LOW (ref 13.0–17.0)
MCH: 31.4 pg (ref 26.0–34.0)
MCHC: 33.8 g/dL (ref 30.0–36.0)
MCV: 92.8 fL (ref 80.0–100.0)
Platelets: 349 10*3/uL (ref 150–400)
RBC: 2.9 MIL/uL — ABNORMAL LOW (ref 4.22–5.81)
RDW: 12.4 % (ref 11.5–15.5)
WBC: 8.1 10*3/uL (ref 4.0–10.5)
nRBC: 0 % (ref 0.0–0.2)

## 2020-01-30 LAB — BASIC METABOLIC PANEL
Anion gap: 9 (ref 5–15)
BUN: <5 mg/dL — ABNORMAL LOW (ref 6–20)
CO2: 23 mmol/L (ref 22–32)
Calcium: 8.6 mg/dL — ABNORMAL LOW (ref 8.9–10.3)
Chloride: 108 mmol/L (ref 98–111)
Creatinine, Ser: 0.68 mg/dL (ref 0.61–1.24)
GFR calc Af Amer: >60 mL/min (ref >60)
GFR calc non Af Amer: >60 mL/min (ref >60)
Glucose, Bld: 96 mg/dL (ref 70–99)
Potassium: 3.8 mmol/L (ref 3.5–5.1)
Sodium: 140 mmol/L (ref 135–145)

## 2020-01-30 LAB — CULTURE, BLOOD (ROUTINE X 2)
Culture: NO GROWTH
Culture: NO GROWTH
Special Requests: ADEQUATE
Special Requests: ADEQUATE

## 2020-01-30 LAB — GLUCOSE, CAPILLARY
Glucose-Capillary: 110 mg/dL — ABNORMAL HIGH (ref 70–99)
Glucose-Capillary: 80 mg/dL (ref 70–99)
Glucose-Capillary: 96 mg/dL (ref 70–99)
Glucose-Capillary: 122 mg/dL — ABNORMAL HIGH (ref 70–99)

## 2020-01-30 LAB — PROTIME-INR
INR: 1.3 — ABNORMAL HIGH (ref 0.8–1.2)
Prothrombin Time: 16.4 seconds — ABNORMAL HIGH (ref 11.4–15.2)

## 2020-01-30 LAB — HEPARIN LEVEL (UNFRACTIONATED)
Heparin Unfractionated: <0.1 IU/mL — ABNORMAL LOW (ref 0.30–0.70)
Heparin Unfractionated: <0.1 IU/mL — ABNORMAL LOW (ref 0.30–0.70)

## 2020-01-30 LAB — LACTATE DEHYDROGENASE: LDH: 133 U/L (ref 98–192)

## 2020-01-30 NOTE — Progress Notes (Signed)
Pt places self on/off cpap as needed. RT will monitor. 

## 2020-01-30 NOTE — Progress Notes (Signed)
      [address]       [name] [address]             [phone]     The lower drive line wound dressing was removed. The outer covering was dry and the packing had some thin serosanguinous drainage.  The wound appeared  hemostatic after the gauze packing was removed.  The MRDH dressing material was left in place.  Using sterile technique, the surrounding skin was painted with betadine then the open wound was packed with new saline-moistened 4x4's then covered with a dry dressing and tape.  Dr. Maren Beach plans for wound exploration and irrigation in the OR tomorrow afternoon.   Gaynelle Arabian, PA-C [phone]

## 2020-01-30 NOTE — Progress Notes (Signed)
ANTICOAGULATION CONSULT NOTE  Pharmacy Consult for Heparin while Coumadin on hold Indication: LVAD  Allergies  Allergen Reactions  . Chlorhexidine Rash  . Other Rash    Prep pads    Patient Measurements: Height: 5\' 11"  (180.3 cm) Weight: 186 lb 15.2 oz (84.8 kg) IBW/kg (Calculated) : 75.3 Heparin Dosing Weight: 85 kg  Vital Signs: Temp: 98.2 F (36.8 C) (03/14 2014) Temp Source: Oral (03/14 2014) BP: 107/91 (03/14 2014) Pulse Rate: 69 (03/14 2014)  Labs: Recent Labs    01/28/20 0605 01/28/20 0605 01/28/20 0652 01/29/20 0400 01/29/20 1510 01/30/20 0335 01/30/20 2024  HGB 10.5*   < >  --  9.0*  --  9.1*  --   HCT 31.3*  --   --  27.1*  --  26.9*  --   PLT 311  --   --  313  --  349  --   LABPROT  --   --  22.6* 19.7*  --  16.4*  --   INR  --   --  2.0* 1.7*  --  1.3*  --   HEPARINUNFRC  --   --   --   --  <0.10* <0.10* <0.10*  CREATININE 0.71  --   --  0.72  --  0.68  --    < > = values in this interval not displayed.    Estimated Creatinine Clearance: 104.6 mL/min (by C-G formula based on SCr of 0.68 mg/dL).   Medical History: Past Medical History:  Diagnosis Date  . 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 Cardiomyopathy, unspecified (HCC)   . CHF (congestive heart failure) (HCC)   . Chronic back pain   . Diabetes mellitus without complication (HCC)   . Enlarged heart   . Gastric ulcer   . Gastroenteritis   . H/O degenerative disc disease   . Hypertension   . LVAD (left ventricular assist device) present [facility])     Assessment: 58yom presenting with concern of large sternal abscess- starting on antibiotics. On warfarin PTA for hx LVAD (goal 2-2.5).  PTA regimen is warfarin 5 mg daily except 7.5 mg TTS.   Having bleeding from the wounds, s/p DDAVP 3/12.  Hgb trending down.  Pharmacy asked to start IV heparin 3/13 while INR below 2. Heparin drip 600 uts/hr;  HL < 0.1 -very slow titrationsper Dr 4/13 plan OR in am for further debridement  Checking  HL to ensure not > 0.3   Goal of Therapy:  Discussed with Drs. McLean/VanTrigt > will adjust heparin slowly toward goal of ~ 0.3 INR 2-2.5 Monitor platelets by anticoagulation protocol: Yes   Plan:  Continue IV heparin 600 units/hr. Check heparin level daily to ensure not > 0.3 and CBC. Will f/u ability to titrate heparin to therapeutic level depending on bleeding and surgery plans  F/u plans to resume Coumadin after OR on Monday.   Wednesday Pharm.D. CPP, BCPS Clinical Pharmacist [phone] 01/30/2020 9:49 PM

## 2020-01-30 NOTE — Progress Notes (Addendum)
ANTICOAGULATION CONSULT NOTE  Pharmacy Consult for Heparin while Coumadin on hold Indication: LVAD  Allergies  Allergen Reactions  . Chlorhexidine Rash  . Other Rash    Prep pads    Patient Measurements: Height: 5\' 11"  (180.3 cm) Weight: 186 lb 15.2 oz (84.8 kg) IBW/kg (Calculated) : 75.3 Heparin Dosing Weight: 85 kg  Vital Signs: Temp: 97.9 F (36.6 C) (03/14 0300) Temp Source: Oral (03/14 0300) BP: 90/79 (03/14 0300) Pulse Rate: 78 (03/14 0300)  Labs: Recent Labs    01/28/20 0605 01/28/20 0605 01/28/20 0652 01/29/20 0400 01/29/20 1510 01/30/20 0335  HGB 10.5*   < >  --  9.0*  --  9.1*  HCT 31.3*  --   --  27.1*  --  26.9*  PLT 311  --   --  313  --  349  LABPROT  --   --  22.6* 19.7*  --  16.4*  INR  --   --  2.0* 1.7*  --  1.3*  HEPARINUNFRC  --   --   --   --  <0.10* <0.10*  CREATININE 0.71  --   --  0.72  --  0.68   < > = values in this interval not displayed.    Estimated Creatinine Clearance: 104.6 mL/min (by C-G formula based on SCr of 0.68 mg/dL).   Medical History: Past Medical History:  Diagnosis Date  . 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 Cardiomyopathy, unspecified (HCC)   . CHF (congestive heart failure) (HCC)   . Chronic back pain   . Diabetes mellitus without complication (HCC)   . Enlarged heart   . Gastric ulcer   . Gastroenteritis   . H/O degenerative disc disease   . Hypertension   . LVAD (left ventricular assist device) present [facility])     Assessment: 58yom presenting with concern of large sternal abscess- starting on antibiotics. On warfarin PTA for hx LVAD (goal 2-2.5).  PTA regimen is warfarin 5 mg daily except 7.5 mg TTS.   Having bleeding from the wounds, s/p DDAVP 3/12.  Hgb trending down.  Pharmacy asked to start IV heparin 3/13 while INR below 2. Heparin drip 500 uts/hr;  HL < 0.1 - no titration for now per Dr 4/13 for possible need further debridement  Checking HL to ensure not > 0.3   Goal of Therapy:   Discussed with Drs. McLean/VanTrigt > will adjust heparin slowly toward goal of ~ 0.3 INR 2-2.5 Monitor platelets by anticoagulation protocol: Yes   Plan:  Increase IV heparin to 600 units/hr. Check heparin level daily to ensure not > 0.3 and CBC. Will f/u ability to titrate heparin to therapeutic level depending on bleeding and surgery plans  F/u plans to resume Coumadin after OR on Monday.  Thursday, [facility] Clinical Pharmacist Phone [phone]  01/30/2020 8:07 AM

## 2020-01-30 NOTE — Progress Notes (Signed)
Patient ID: Theodore Welch, male   DOB: Aug 16, 1961, 59 y.o.   MRN: [id number] P    Advanced Heart Failure Rounding Note  PCP-Cardiologist: No primary care provider on file.   Subjective:    To OR 3/10, lower substernal abscess I&D, did not visualize LVAD hardware but able to palpate outflow graft.  Driveline site also with I&D.  Wound vacs placed lower sternal site and driveline site, now removed due to recurrent occlusion and wounds dressed.   WBC trending down 21>16>10.9>10>8. Afebrile.  He has been seen by ID, now on cefazolin. Wound culture with MSSA, blood cultures negative.   MAP appears stable 80s on current regimen.   Off warfarin for OR, INR 1.3. He is on heparin gtt 500 U/hr, no titration per Dr. Donata Clay.   Eating and drinking better.  Fewer PI events over the last day.    CT abdomen: subxiphoid fluid collection with superficial and deep components, collection is along the course of the driveline. Cellulitis at surface, no evidence for sternal osteomyelitis.   LVAD: Heartmate 3. Flow 4.5 L/min, 5700 rpm, PI 3.8, power 4.4. 10 PI events last day.  LDH stable at 133.     Objective:   Weight Range: 84.8 kg Body mass index is 26.07 kg/m.   Vital Signs:   Temp:  [97.9 F (36.6 C)-98.1 F (36.7 C)] 97.9 F (36.6 C) (03/14 0300) Pulse Rate:  [64-78] 78 (03/14 0300) Resp:  [15-28] 15 (03/14 0358) BP: (90-105)/(66-92) 98/84 (03/14 0834) SpO2:  [97 %-100 %] 100 % (03/14 0834) Weight:  [84.8 kg] 84.8 kg (03/14 0450) Last BM Date: 01/29/20  Weight change: Filed Weights   01/28/20 0621 01/29/20 0331 01/30/20 0450  Weight: 81.1 kg 84.1 kg 84.8 kg    Intake/Output:   Intake/Output Summary (Last 24 hours) at 01/30/2020 0846 Last data filed at 01/30/2020 0600 Gross per 24 hour  Intake 1095.53 ml  Output 900 ml  Net 195.53 ml      Physical Exam    General: Well appearing this am. NAD.  HEENT: Normal. Neck: Supple, JVP 7-8 cm. Carotids OK.  Cardiac:  Mechanical  heart sounds with LVAD hum present.  Lungs:  CTAB, normal effort.  Abdomen:  NT, ND, no HSM. No bruits or masses. +BS  LVAD exit site: Dressed with gauze.  Extremities:  Warm and dry. No cyanosis, clubbing, rash, or edema.  Neuro:  Alert & oriented x 3. Cranial nerves grossly intact. Moves all 4 extremities w/o difficulty. Affect pleasant     Telemetry  SR 80s -90s   Labs    CBC Recent Labs    01/29/20 0400 01/30/20 0335  WBC 10.2 8.1  HGB 9.0* 9.1*  HCT 27.1* 26.9*  MCV 93.1 92.8  PLT 313 349   Basic Metabolic Panel Recent Labs    56/97/94 0400 01/30/20 0335  NA 137 140  K 3.5 3.8  CL 104 108  CO2 23 23  GLUCOSE 92 96  BUN 6 <5*  CREATININE 0.72 0.68  CALCIUM 8.5* 8.6*   Liver Function Tests Recent Labs    01/28/20 0605  AST 17  ALT 14  ALKPHOS 64  BILITOT 0.8  PROT 6.1*  ALBUMIN 2.6*   No results for input(s): LIPASE, AMYLASE in the last 72 hours. Cardiac Enzymes No results for input(s): CKTOTAL, CKMB, CKMBINDEX, TROPONINI in the last 72 hours.  BNP: BNP (last 3 results) No results for input(s): BNP in the last 8760 hours.  ProBNP (last 3  results) No results for input(s): PROBNP in the last 8760 hours.   D-Dimer No results for input(s): DDIMER in the last 72 hours. Hemoglobin A1C No results for input(s): HGBA1C in the last 72 hours. Fasting Lipid Panel No results for input(s): CHOL, HDL, LDLCALC, TRIG, CHOLHDL, LDLDIRECT in the last 72 hours. Thyroid Function Tests No results for input(s): TSH, T4TOTAL, T3FREE, THYROIDAB in the last 72 hours.  Invalid input(s): FREET3  Other results:   Imaging    No results found.   Medications:     Scheduled Medications: . sodium chloride   Intravenous Once  . acetaminophen  1,000 mg Oral Q6H   Or  . acetaminophen (TYLENOL) oral liquid 160 mg/5 mL  1,000 mg Oral Q6H  . aspirin EC  81 mg Oral Daily  . atorvastatin  20 mg Oral Daily  . bisacodyl  10 mg Oral Daily  . DULoxetine  60 mg Oral  Daily  . fluticasone  1 spray Each Nare Daily  . insulin aspart  0-15 Units Subcutaneous TID WC  . insulin aspart  0-5 Units Subcutaneous QHS  . pantoprazole  40 mg Oral Daily  . potassium chloride  40 mEq Oral Daily  . sacubitril-valsartan  1 tablet Oral BID  . senna-docusate  1 tablet Oral QHS  . sodium chloride flush  3 mL Intravenous Q12H  . spironolactone  25 mg Oral Daily  . thiamine  100 mg Oral Daily  . vitamin B-12  1,000 mcg Oral Daily  . zinc sulfate  220 mg Oral Daily    Infusions: . sodium chloride 10 mL/hr at 01/29/20 1928  . sodium chloride    . sodium chloride 10 mL/hr at 01/26/20 0300  .  ceFAZolin (ANCEF) IV 2 g (01/30/20 0546)  . heparin 500 Units/hr (01/29/20 1928)    PRN Medications: sodium chloride, sodium chloride, acetaminophen, docusate sodium, fentaNYL (SUBLIMAZE) injection, hydrALAZINE, hydrOXYzine, ipratropium-albuterol, methocarbamol, ondansetron (ZOFRAN) IV, ondansetron (ZOFRAN) IV, oxyCODONE, polyethylene glycol, sodium chloride flush, traMADol, zolpidem   Assessment/Plan   1. Subxiphoid abscess: With surrounding cellulitis.  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  CT abdomen showed subxiphoid fluid collection with superficial and deep components, collection is along the course of the driveline. Cellulitis at surface, no evidence for sternal osteomyelitis. S/P I&D 3/10, did not visualize LVAD hardware but able to palpate outflow graft.  Driveline site also with I&D.  Wound vacs placed to lower sternal site and driveline site but now removed with recurrent occlusions.  Now afebrile with WBCs down. MSSA from wound culture. Echo this admission did not show evidence for vegetations but technically difficult.  - Back to OR for relook on Monday. It is possible that he could need muscle flap reconstruction eventually.  - Continue Cefazolin x 8 wks.  - ID following.  Very concerning infection, especially  given prior MSSA driveline infection and already treating with suppressive Keflex. There is a possibility that this could lead to the need for pump exchange, at the least he will need extended IV treatment.  2. 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  NYHA class I-II at baseline.  Volume status stable.  MAP stable. Fewer PI events with improved po intake.  - Continue to encourage po intake.  Think we can hold off on further IV fluid today.  - Continue spironolactone 25 mg daily.  - Continue Entresto, dose decreased to 49/51 with infection.    -  Amlodipine has been held.  - Continue ASA 81 daily. - Warfarin with goal INR 2-2.5, now on hold for OR.  INR 1.3 today, on low dose heparin gtt with no titration per Dr. Donata Clay.   - Should be transplant candidate eventually if he can quit smoking.  Will make transplant clinic referral when he is off totally.  3. Smoking: Cutting back.   4. H/o RLE DVT: Has been on warfarin for LVAD.  5. OSA: Continue CPAP.  6. Hyperlipidemia: Atorvastatin.  7. Type II diabetes: SSI while inpatient.    Ambulate.  Length of Stay: 5  Marca Ancona, MD  01/30/2020, 8:46 AM  Advanced Heart Failure Team Pager [phone] (M-F; 7a - 4p)  Please contact [facility] for night-coverage after hours (4p -7a ) and weekends on [email or web address]

## 2020-01-30 NOTE — Plan of Care (Signed)
  Problem: Education: Goal: Knowledge of General Education information will improve Description: Including pain rating scale, medication(s)/side effects and non-pharmacologic comfort measures Outcome: Progressing   Problem: Education: Goal: Knowledge of General Education information will improve Description: Including pain rating scale, medication(s)/side effects and non-pharmacologic comfort measures Outcome: Progressing   Problem: Health Behavior/Discharge Planning: Goal: Ability to manage health-related needs will improve Outcome: Progressing   Problem: Clinical Measurements: Goal: Ability to maintain clinical measurements within normal limits will improve Outcome: Progressing Goal: Will remain free from infection Outcome: Progressing Goal: Diagnostic test results will improve Outcome: Progressing   Problem: Activity: Goal: Risk for activity intolerance will decrease Outcome: Progressing   Problem: Nutrition: Goal: Adequate nutrition will be maintained Outcome: Progressing   Problem: Coping: Goal: Level of anxiety will decrease Outcome: Progressing   Problem: Elimination: Goal: Will not experience complications related to bowel motility Outcome: Progressing Goal: Will not experience complications related to urinary retention Outcome: Progressing   Problem: Pain Managment: Goal: General experience of comfort will improve Outcome: Progressing

## 2020-01-31 ENCOUNTER — Encounter (HOSPITAL_COMMUNITY)
Admission: AD | Disposition: A | Discharge status: Home Health | Payer: Self-pay | Source: Ambulatory Visit | Attending: Cardiology

## 2020-01-31 ENCOUNTER — Inpatient Hospital Stay (HOSPITAL_COMMUNITY): Payer: No Typology Code available for payment source | Admitting: Certified Registered Nurse Anesthetist

## 2020-01-31 DIAGNOSIS — B9561 Methicillin susceptible Staphylococcus aureus infection as the cause of diseases classified elsewhere: Secondary | ICD-10-CM

## 2020-01-31 DIAGNOSIS — Z91048 Other nonmedicinal substance allergy status: Secondary | ICD-10-CM

## 2020-01-31 DIAGNOSIS — T827XXA Infection and inflammatory reaction due to other cardiac and vascular devices, implants and grafts, initial encounter: Principal | ICD-10-CM

## 2020-01-31 DIAGNOSIS — Z95811 Presence of heart assist device: Secondary | ICD-10-CM

## 2020-01-31 HISTORY — PX: STERNAL WOUND DEBRIDEMENT: SHX1058

## 2020-01-31 HISTORY — PX: APPLICATION OF WOUND VAC: SHX5189

## 2020-01-31 LAB — GLUCOSE, CAPILLARY
Glucose-Capillary: 96 mg/dL (ref 70–99)
Glucose-Capillary: 85 mg/dL (ref 70–99)
Glucose-Capillary: 78 mg/dL (ref 70–99)
Glucose-Capillary: 82 mg/dL (ref 70–99)
Glucose-Capillary: 88 mg/dL (ref 70–99)
Glucose-Capillary: 212 mg/dL — ABNORMAL HIGH (ref 70–99)

## 2020-01-31 LAB — BASIC METABOLIC PANEL
Anion gap: 4 — ABNORMAL LOW (ref 5–15)
BUN: <5 mg/dL — ABNORMAL LOW (ref 6–20)
CO2: 26 mmol/L (ref 22–32)
Calcium: 8.8 mg/dL — ABNORMAL LOW (ref 8.9–10.3)
Chloride: 110 mmol/L (ref 98–111)
Creatinine, Ser: 0.75 mg/dL (ref 0.61–1.24)
GFR calc Af Amer: >60 mL/min (ref >60)
GFR calc non Af Amer: >60 mL/min (ref >60)
Glucose, Bld: 90 mg/dL (ref 70–99)
Potassium: 4.4 mmol/L (ref 3.5–5.1)
Sodium: 140 mmol/L (ref 135–145)

## 2020-01-31 LAB — CBC
HCT: 27.7 % — ABNORMAL LOW (ref 39.0–52.0)
Hemoglobin: 9.1 g/dL — ABNORMAL LOW (ref 13.0–17.0)
MCH: 30.6 pg (ref 26.0–34.0)
MCHC: 32.9 g/dL (ref 30.0–36.0)
MCV: 93.3 fL (ref 80.0–100.0)
Platelets: 368 10*3/uL (ref 150–400)
RBC: 2.97 MIL/uL — ABNORMAL LOW (ref 4.22–5.81)
RDW: 12.5 % (ref 11.5–15.5)
WBC: 7.3 10*3/uL (ref 4.0–10.5)
nRBC: 0 % (ref 0.0–0.2)

## 2020-01-31 LAB — PROTIME-INR
INR: 1.1 (ref 0.8–1.2)
Prothrombin Time: 14.2 seconds (ref 11.4–15.2)

## 2020-01-31 LAB — LACTATE DEHYDROGENASE: LDH: 134 U/L (ref 98–192)

## 2020-01-31 LAB — HEPARIN LEVEL (UNFRACTIONATED): Heparin Unfractionated: <0.1 IU/mL — ABNORMAL LOW (ref 0.30–0.70)

## 2020-01-31 LAB — PREPARE RBC (CROSSMATCH)

## 2020-01-31 SURGERY — DEBRIDEMENT, WOUND, STERNUM
Anesthesia: General

## 2020-01-31 MED ORDER — LACTATED RINGERS IV SOLN: INTRAVENOUS | Status: DC

## 2020-01-31 MED ORDER — ALBUMIN HUMAN 5 % IV SOLN: INTRAVENOUS | Status: DC | PRN

## 2020-01-31 MED ORDER — WARFARIN - PHYSICIAN DOSING INPATIENT
Freq: Every day | Status: DC
  Administered 2020-02-05: 1

## 2020-01-31 MED ORDER — ONDANSETRON HCL 4 MG/2ML IJ SOLN
INTRAMUSCULAR | Status: DC | PRN
  Administered 2020-01-31: 4 mg via INTRAVENOUS

## 2020-01-31 MED ORDER — VANCOMYCIN HCL 1000 MG IV SOLR
INTRAVENOUS | Status: AC
  Filled 2020-01-31: qty 1000

## 2020-01-31 MED ORDER — PHENYLEPHRINE 40 MCG/ML (10ML) SYRINGE FOR IV PUSH (FOR BLOOD PRESSURE SUPPORT)
PREFILLED_SYRINGE | INTRAVENOUS | Status: DC | PRN
  Administered 2020-01-31: 40 ug via INTRAVENOUS

## 2020-01-31 MED ORDER — ROCURONIUM BROMIDE 10 MG/ML (PF) SYRINGE
PREFILLED_SYRINGE | INTRAVENOUS | Status: DC | PRN
  Administered 2020-01-31: 40 mg via INTRAVENOUS

## 2020-01-31 MED ORDER — DEXAMETHASONE SODIUM PHOSPHATE 10 MG/ML IJ SOLN
INTRAMUSCULAR | Status: AC
  Filled 2020-01-31: qty 1

## 2020-01-31 MED ORDER — PHENYLEPHRINE HCL-NACL 10-0.9 MG/250ML-% IV SOLN
INTRAVENOUS | Status: DC | PRN
  Administered 2020-01-31: 20 ug/min via INTRAVENOUS

## 2020-01-31 MED ORDER — ETOMIDATE 2 MG/ML IV SOLN
INTRAVENOUS | Status: AC
  Filled 2020-01-31: qty 10

## 2020-01-31 MED ORDER — FENTANYL CITRATE (PF) 250 MCG/5ML IJ SOLN
INTRAMUSCULAR | Status: AC
  Filled 2020-01-31: qty 5

## 2020-01-31 MED ORDER — MIDAZOLAM HCL 2 MG/2ML IJ SOLN
INTRAMUSCULAR | Status: AC
  Filled 2020-01-31: qty 2

## 2020-01-31 MED ORDER — ONDANSETRON HCL 4 MG/2ML IJ SOLN: 4.0000 mg | Freq: Four times a day (QID) | INTRAMUSCULAR | Status: DC | PRN

## 2020-01-31 MED ORDER — WARFARIN SODIUM 2.5 MG PO TABS
2.5000 mg | ORAL_TABLET | Freq: Every day | ORAL | Status: DC
  Administered 2020-01-31 – 2020-02-01 (×2): 2.5 mg via ORAL
  Filled 2020-01-31 (×2): qty 1

## 2020-01-31 MED ORDER — SODIUM CHLORIDE 0.9% FLUSH: 10.0000 mL | INTRAVENOUS | Status: DC | PRN

## 2020-01-31 MED ORDER — MIDAZOLAM HCL 5 MG/5ML IJ SOLN
INTRAMUSCULAR | Status: DC | PRN
  Administered 2020-01-31: 2 mg via INTRAVENOUS

## 2020-01-31 MED ORDER — ETOMIDATE 2 MG/ML IV SOLN
INTRAVENOUS | Status: DC | PRN
  Administered 2020-01-31: 12 mg via INTRAVENOUS
  Administered 2020-01-31: 8 mg via INTRAVENOUS

## 2020-01-31 MED ORDER — ACETAMINOPHEN 500 MG PO TABS
1000.0000 mg | ORAL_TABLET | Freq: Four times a day (QID) | ORAL | Status: AC
  Administered 2020-01-31 – 2020-02-05 (×13): 1000 mg via ORAL
  Filled 2020-01-31 (×14): qty 2

## 2020-01-31 MED ORDER — ONDANSETRON HCL 4 MG/2ML IJ SOLN
INTRAMUSCULAR | Status: AC
  Filled 2020-01-31: qty 2

## 2020-01-31 MED ORDER — SODIUM CHLORIDE 0.9 % IR SOLN
Status: DC | PRN
  Administered 2020-01-31: 1000 mL

## 2020-01-31 MED ORDER — ACETAMINOPHEN 160 MG/5ML PO SOLN
1000.0000 mg | Freq: Four times a day (QID) | ORAL | Status: AC
  Administered 2020-02-03 – 2020-02-05 (×5): 1000 mg via ORAL
  Filled 2020-01-31 (×7): qty 40.6

## 2020-01-31 MED ORDER — SUGAMMADEX SODIUM 200 MG/2ML IV SOLN
INTRAVENOUS | Status: DC | PRN
  Administered 2020-01-31: 200 mg via INTRAVENOUS

## 2020-01-31 MED ORDER — BISACODYL 5 MG PO TBEC: 10.0000 mg | DELAYED_RELEASE_TABLET | Freq: Every day | ORAL | Status: DC

## 2020-01-31 MED ORDER — VANCOMYCIN HCL 1000 MG IV SOLR
INTRAVENOUS | Status: DC | PRN
  Administered 2020-01-31: 1000 mg via TOPICAL

## 2020-01-31 MED ORDER — HEPARIN (PORCINE) 25000 UT/250ML-% IV SOLN
850.0000 [IU]/h | INTRAVENOUS | Status: DC
  Administered 2020-02-01: 08:00:00 600 [IU]/h via INTRAVENOUS
  Administered 2020-02-02: 750 [IU]/h via INTRAVENOUS
  Administered 2020-02-04 – 2020-02-08 (×4): 850 [IU]/h via INTRAVENOUS
  Filled 2020-01-31 (×6): qty 250

## 2020-01-31 MED ORDER — SODIUM CHLORIDE 0.9% FLUSH
10.0000 mL | Freq: Two times a day (BID) | INTRAVENOUS | Status: DC
  Administered 2020-01-31 – 2020-02-05 (×10): 10 mL
  Administered 2020-02-05 – 2020-02-06 (×2): 20 mL
  Administered 2020-02-06 – 2020-02-08 (×5): 10 mL

## 2020-01-31 MED ORDER — FENTANYL CITRATE (PF) 250 MCG/5ML IJ SOLN
INTRAMUSCULAR | Status: DC | PRN
  Administered 2020-01-31 (×5): 50 ug via INTRAVENOUS

## 2020-01-31 MED ORDER — DEXAMETHASONE SODIUM PHOSPHATE 10 MG/ML IJ SOLN
INTRAMUSCULAR | Status: DC | PRN
  Administered 2020-01-31: 4 mg via INTRAVENOUS

## 2020-01-31 MED ORDER — SENNOSIDES-DOCUSATE SODIUM 8.6-50 MG PO TABS: 1.0000 | ORAL_TABLET | Freq: Every day | ORAL | Status: DC

## 2020-01-31 MED ORDER — PROPOFOL 10 MG/ML IV BOLUS
INTRAVENOUS | Status: DC | PRN
  Administered 2020-01-31: 20 mg via INTRAVENOUS
  Administered 2020-01-31: 50 mg via INTRAVENOUS

## 2020-01-31 SURGICAL SUPPLY — 36 items
BENZOIN TINCTURE PRP APPL 2/3 (GAUZE/BANDAGES/DRESSINGS) IMPLANT
BLADE SURG 10 STRL SS (BLADE) ×3 IMPLANT
CANISTER PREVENA 45 (CANNISTER) ×3 IMPLANT
CANISTER SUCT 3000ML PPV (MISCELLANEOUS) ×3 IMPLANT
CANISTER WOUND CARE 500ML ATS (WOUND CARE) ×3 IMPLANT
CONNECTOR Y ATS VAC SYSTEM (MISCELLANEOUS) ×3 IMPLANT
COVER SURGICAL LIGHT HANDLE (MISCELLANEOUS) ×3 IMPLANT
DRAPE LAPAROSCOPIC ABDOMINAL (DRAPES) ×3 IMPLANT
DRSG CUTIMED SORBACT 7X9 (GAUZE/BANDAGES/DRESSINGS) ×3 IMPLANT
DRSG VAC ATS MED SENSATRAC (GAUZE/BANDAGES/DRESSINGS) ×3 IMPLANT
DRSG VAC ATS SM SENSATRAC (GAUZE/BANDAGES/DRESSINGS) IMPLANT
ELECT REM PT RETURN 9FT ADLT (ELECTROSURGICAL) ×3
ELECTRODE REM PT RTRN 9FT ADLT (ELECTROSURGICAL) ×1 IMPLANT
GAUZE SPONGE 4X4 12PLY STRL (GAUZE/BANDAGES/DRESSINGS) ×3 IMPLANT
GLOVE BIO SURGEON STRL SZ7.5 (GLOVE) ×6 IMPLANT
GOWN STRL REUS W/ TWL LRG LVL3 (GOWN DISPOSABLE) ×2 IMPLANT
GOWN STRL REUS W/TWL LRG LVL3 (GOWN DISPOSABLE) ×6
KIT BASIN OR (CUSTOM PROCEDURE TRAY) ×3 IMPLANT
KIT SUCTION CATH 14FR (SUCTIONS) IMPLANT
KIT TURNOVER KIT B (KITS) ×3 IMPLANT
MATRIX WOUND 3-LAYER 10X15 (Tissue) ×2 IMPLANT
MICROMATRIX 1000MG (Tissue) ×15 IMPLANT
NS IRRIG 1000ML POUR BTL (IV SOLUTION) ×3 IMPLANT
PACK CHEST (CUSTOM PROCEDURE TRAY) IMPLANT
PACK GENERAL/GYN (CUSTOM PROCEDURE TRAY) ×3 IMPLANT
PAD ARMBOARD 7.5X6 YLW CONV (MISCELLANEOUS) ×6 IMPLANT
PROBE DEBRIDE SONICVAC MISONIX (TIP) ×3 IMPLANT
SOL PREP POV-IOD 4OZ 10% (MISCELLANEOUS) ×3 IMPLANT
SOLUTION PARTIC MCRMTRX 1000MG (Tissue) ×5 IMPLANT
SUT STEEL SZ 6 DBL 3X14 BALL (SUTURE) IMPLANT
SUT VIC AB 3-0 SH 8-18 (SUTURE) ×3 IMPLANT
SUT VIC AB 3-0 X1 27 (SUTURE) IMPLANT
TOWEL GREEN STERILE (TOWEL DISPOSABLE) ×3 IMPLANT
TOWEL GREEN STERILE FF (TOWEL DISPOSABLE) ×3 IMPLANT
TUBE IRRIGATION SET MISONIX (TUBING) ×3 IMPLANT
WOUND MATRIX 3-LAYER 10X15 (Tissue) ×1 IMPLANT

## 2020-01-31 NOTE — Transfer of Care (Signed)
Immediate Anesthesia Transfer of Care Note  Patient: Theodore Welch  Procedure(s) Performed: STERNAL WOUND IRRIGATION (N/A ) WOUND VAC CHANGE (N/A )  Patient Location: PACU  Anesthesia Type:General  Level of Consciousness: awake, alert  and oriented  Airway & Oxygen Therapy: Patient Spontanous Breathing  Post-op Assessment: Report given to RN and Post -op Vital signs reviewed and stable  Post vital signs: Reviewed and stable  Last Vitals:  Vitals Value Taken Time  BP 120/103 01/31/20 1913  Temp    Pulse 67 01/31/20 1915  Resp 23 01/31/20 1915  SpO2 97 % 01/31/20 1915  Vitals shown include unvalidated device data.  Last Pain:  Vitals:   01/31/20 1159  TempSrc:   PainSc: 0-No pain      Patients Stated Pain Goal: 2 (01/29/20 2300)  Complications: No apparent anesthesia complications

## 2020-01-31 NOTE — Progress Notes (Signed)
[facility]  Date of Admission:  01/25/2020     Total days of antibiotics 7         ASSESSMENT:  Mr. Maybury has MSSA abscess complicating his LVAD. Scheduled for repeat I&D today. Has remained afebrile and blood cultures finalized without growth. Will require prolonged course of Cefazolin followed by chronic suppression with cephalexin. Continue wound care per CVTS.    PLAN:  1. Repeat I&D today with CVTS. 2. Continue Cefazolin for MSSA infection.  3. Arrange PICC line in the next 24-48 hours for home health therapy.    Principal Problem:   Staph aureus infection Active Problems:   Complication involving left ventricular assist device (LVAD)   Abscess of sternal region   . sodium chloride   Intravenous Once  . acetaminophen  1,000 mg Oral Q6H   Or  . acetaminophen (TYLENOL) oral liquid 160 mg/5 mL  1,000 mg Oral Q6H  . aspirin EC  81 mg Oral Daily  . atorvastatin  20 mg Oral Daily  . bisacodyl  10 mg Oral Daily  . DULoxetine  60 mg Oral Daily  . fluticasone  1 spray Each Nare Daily  . insulin aspart  0-15 Units Subcutaneous TID WC  . insulin aspart  0-5 Units Subcutaneous QHS  . pantoprazole  40 mg Oral Daily  . potassium chloride  40 mEq Oral Daily  . sacubitril-valsartan  1 tablet Oral BID  . senna-docusate  1 tablet Oral QHS  . sodium chloride flush  3 mL Intravenous Q12H  . spironolactone  25 mg Oral Daily  . thiamine  100 mg Oral Daily  . vitamin B-12  1,000 mcg Oral Daily  . zinc sulfate  220 mg Oral Daily    SUBJECTIVE:  Afebrile overnight with no acute events. Planned repeat I&D today.   Allergies  Allergen Reactions  . Chlorhexidine Rash  . Other Rash    Prep pads     Review of Systems: Review of Systems  Constitutional: Negative for chills, fever and weight loss.  Respiratory: Negative for cough, shortness of breath and wheezing.   Cardiovascular: Negative for chest pain and leg swelling.  Gastrointestinal:  Negative for abdominal pain, constipation, diarrhea, nausea and vomiting.  Skin: Negative for rash.      OBJECTIVE: Vitals:   01/31/20 0021 01/31/20 0424 01/31/20 0500 01/31/20 0800  BP: 95/78     Pulse: 77   67  Resp: 20     Temp: 98.2 F (36.8 C) 98 F (36.7 C)  98 F (36.7 C)  TempSrc: Oral Oral  Oral  SpO2:    100%  Weight:   83.6 kg   Height:       Body mass index is 25.71 kg/m.  Physical Exam Constitutional:      General: He is not in acute distress.    Appearance: He is well-developed.  Cardiovascular:     Rate and Rhythm: Normal rate and regular rhythm.     Comments: LVAD hum Pulmonary:     Effort: Pulmonary effort is normal.     Breath sounds: Normal breath sounds.  Skin:    General: Skin is warm and dry.  Neurological:     Mental Status: He is alert and oriented to person, place, and time.  Psychiatric:        Behavior: Behavior normal.        Thought Content: Thought content normal.        Judgment: Judgment normal.  Lab Results Lab Results  Component Value Date   WBC 7.3 01/31/2020   HGB 9.1 (L) 01/31/2020   HCT 27.7 (L) 01/31/2020   MCV 93.3 01/31/2020   PLT 368 01/31/2020    Lab Results  Component Value Date   CREATININE 0.68 01/30/2020   BUN <5 (L) 01/30/2020   NA 140 01/30/2020   K 3.8 01/30/2020   CL 108 01/30/2020   CO2 23 01/30/2020    Lab Results  Component Value Date   ALT 14 01/28/2020   AST 17 01/28/2020   ALKPHOS 64 01/28/2020   BILITOT 0.8 01/28/2020     Microbiology: Recent Results (from the past 240 hour(s))  Culture, blood (Routine X 2) w Reflex to ID Panel     Status: None   Collection Time: 01/25/20 11:00 AM   Specimen: BLOOD LEFT WRIST  Result Value Ref Range Status   Specimen Description BLOOD LEFT WRIST  Final   Special Requests   Final    BOTTLES DRAWN AEROBIC AND ANAEROBIC Blood Culture adequate volume   Culture   Final    NO GROWTH 5 DAYS Performed at [facility], [address],  [place], [place] [address]    Report Status 01/30/2020 FINAL  Final  Culture, blood (routine x 2)     Status: None   Collection Time: 01/25/20 12:59 PM   Specimen: BLOOD RIGHT WRIST  Result Value Ref Range Status   Specimen Description BLOOD RIGHT WRIST  Final   Special Requests   Final    BOTTLES DRAWN AEROBIC AND ANAEROBIC Blood Culture adequate volume   Culture   Final    NO GROWTH 5 DAYS Performed at [facility], [address] [place], [place] [address]    Report Status 01/30/2020 FINAL  Final  Aerobic Culture (superficial specimen)     Status: None   Collection Time: 01/25/20  3:14 PM   Specimen: Wound  Result Value Ref Range Status   Specimen Description WOUND CHEST  Final   Special Requests NONE  Final   Gram Stain   Final    MODERATE WBC PRESENT, PREDOMINANTLY PMN RARE GRAM POSITIVE COCCI Performed at [facility], [address], [place], [place] [address]    Culture FEW STAPHYLOCOCCUS AUREUS  Final   Report Status 01/28/2020 FINAL  Final   Organism ID, Bacteria STAPHYLOCOCCUS AUREUS  Final      Susceptibility   Staphylococcus aureus - MIC*    CIPROFLOXACIN <=0.5 SENSITIVE Sensitive     ERYTHROMYCIN >=8 RESISTANT Resistant     GENTAMICIN <=0.5 SENSITIVE Sensitive     OXACILLIN 0.5 SENSITIVE Sensitive     TETRACYCLINE 2 SENSITIVE Sensitive     VANCOMYCIN 1 SENSITIVE Sensitive     TRIMETH/SULFA <=10 SENSITIVE Sensitive     CLINDAMYCIN RESISTANT Resistant     RIFAMPIN <=0.5 SENSITIVE Sensitive     Inducible Clindamycin POSITIVE Resistant     * FEW STAPHYLOCOCCUS AUREUS  SARS CORONAVIRUS 2 (TAT 6-24 HRS) Nasopharyngeal Nasopharyngeal Swab     Status: None   Collection Time: 01/25/20  9:40 PM   Specimen: Nasopharyngeal Swab  Result Value Ref Range Status   SARS Coronavirus 2 NEGATIVE NEGATIVE Final    Comment: (NOTE) SARS-CoV-2 target nucleic acids are NOT DETECTED. The SARS-CoV-2 RNA is generally detectable in upper and lower respiratory specimens  during the acute phase of infection. Negative results do not preclude SARS-CoV-2 infection, do not rule out co-infections with other pathogens, and  should not be used as the sole basis for treatment or other patient management decisions. Negative results must be combined with clinical observations, patient history, and epidemiological information. The expected result is Negative. Fact Sheet for Patients: SugarRoll.be Fact Sheet for Healthcare Providers: https://www.woods-mathews.com/ This test is not yet approved or cleared by the [place] FDA and  has been authorized for detection and/or diagnosis of SARS-CoV-2 by FDA under an Emergency Use Authorization (EUA). This EUA will remain  in effect (meaning this test can be used) for the duration of the COVID-19 declaration under Section 56 4(b)(1) of the Act, 21 U.S.C. section 360bbb-3(b)(1), unless the authorization is terminated or revoked sooner. Performed at [facility], [place] [address], [place], [place] [address]   MRSA PCR Screening     Status: None   Collection Time: 01/25/20  9:46 PM   Specimen: Nasal Mucosa; Nasopharyngeal  Result Value Ref Range Status   MRSA by PCR NEGATIVE NEGATIVE Final    Comment:        The GeneXpert MRSA Assay (FDA approved for NASAL specimens only), is one component of a comprehensive MRSA colonization surveillance program. It is not intended to diagnose MRSA infection nor to guide or monitor treatment for MRSA infections. Performed at [facility], [place] [address], [place], [place] [address]   Aerobic/Anaerobic Culture (surgical/deep wound)     Status: None (Preliminary result)   Collection Time: 01/26/20  9:15 AM   Specimen: PATH Soft tissue  Result Value Ref Range Status   Specimen Description TISSUE STERNAL WOUND IRRIGATION AND [place]  Final   Special Requests A  Final   Gram Stain   Final    FEW WBC PRESENT, PREDOMINANTLY  PMN NO ORGANISMS SEEN Performed at [facility], [place] [address] [place], [place] [address]    Culture   Final    FEW STAPHYLOCOCCUS AUREUS NO ANAEROBES ISOLATED; CULTURE IN PROGRESS FOR 5 DAYS    Report Status PENDING  Incomplete   Organism ID, Bacteria STAPHYLOCOCCUS AUREUS  Final      Susceptibility   Staphylococcus aureus - MIC*    CIPROFLOXACIN <=0.5 SENSITIVE Sensitive     ERYTHROMYCIN >=8 RESISTANT Resistant     GENTAMICIN <=0.5 SENSITIVE Sensitive     OXACILLIN 0.5 SENSITIVE Sensitive     TETRACYCLINE 2 SENSITIVE Sensitive     VANCOMYCIN <=0.5 SENSITIVE Sensitive     TRIMETH/SULFA <=10 SENSITIVE Sensitive     CLINDAMYCIN RESISTANT Resistant     RIFAMPIN <=0.5 SENSITIVE Sensitive     Inducible Clindamycin POSITIVE Resistant     * FEW STAPHYLOCOCCUS AUREUS  Acid Fast Smear (AFB)     Status: None   Collection Time: 01/26/20  9:15 AM   Specimen: PATH Soft tissue  Result Value Ref Range Status   AFB Specimen Processing Concentration  Final   Acid Fast Smear Negative  Final    Comment: (NOTE) Performed At: [facility] [address] [place], [place] [id number] Rush Farmer MD MV:[id number]    Source (AFB) TISSUE  Final    Comment: STERNAL WOUND IRRIGATION AND DEBRIDGEMENTTIS STERNAL WOUND IRRIGATION AND [place], NP [facility] [phone] Pager  01/31/2020  9:37 AM

## 2020-01-31 NOTE — Progress Notes (Signed)
ANTICOAGULATION CONSULT NOTE  Pharmacy Consult for Heparin while Coumadin on hold Indication: LVAD  Allergies  Allergen Reactions  . Chlorhexidine Rash  . Other Rash    Prep pads    Patient Measurements: Height: 5\' 11"  (180.3 cm) Weight: 184 lb 4.9 oz (83.6 kg) IBW/kg (Calculated) : 75.3 Heparin Dosing Weight: 85 kg  Vital Signs: Temp: (P) 98.5 F (36.9 C) (03/15 1930) Temp Source: Oral (03/15 1117) BP: 115/93 (03/15 1930) Pulse Rate: 67 (03/15 1930)  Labs: Recent Labs    01/29/20 0400 01/29/20 1510 01/30/20 0335 01/30/20 2024 01/31/20 0433 01/31/20 1030  HGB 9.0*  --  9.1*  --  9.1*  --   HCT 27.1*  --  26.9*  --  27.7*  --   PLT 313  --  349  --  368  --   LABPROT 19.7*  --  16.4*  --  14.2  --   INR 1.7*  --  1.3*  --  1.1  --   HEPARINUNFRC  --    < > <0.10* <0.10* <0.10*  --   CREATININE 0.72  --  0.68  --   --  0.75   < > = values in this interval not displayed.    Estimated Creatinine Clearance: 104.6 mL/min (by C-G formula based on SCr of 0.75 mg/dL).   Medical History: Past Medical History:  Diagnosis Date  . 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 Cardiomyopathy, unspecified (HCC)   . CHF (congestive heart failure) (HCC)   . Chronic back pain   . Diabetes mellitus without complication (HCC)   . Enlarged heart   . Gastric ulcer   . Gastroenteritis   . H/O degenerative disc disease   . Hypertension   . LVAD (left ventricular assist device) present [facility])     Assessment: 58yom presenting with concern of large sternal abscess- starting on antibiotics. On warfarin PTA for hx LVAD (goal 2-2.5).  PTA regimen is warfarin 5 mg daily except 7.5 mg TTS.   Having bleeding from the wounds, s/p DDAVP 3/12. Hgb stable overnight  Pharmacy asked to start IV heparin 3/13 while INR below 2.  Heparin drip 600 uts/hr;  HL < 0.1. No adjustments made due to OR. Patient now post-op with instructions to start heparin back in AM per CTS.    Goal of Therapy:   Heparin level <0.3 INR 2-2.5 Monitor platelets by anticoagulation protocol: Yes   Plan:  Restart  IV heparin at 600 units/hr starting at 0800 in AM 3/16. Check heparin level daily to ensure not > 0.3 and CBC. Will f/u ability to titrate heparin to therapeutic level depending on bleeding and surgery plans  F/u plans to resume Coumadin after OR.  Harout Scheurich A. 4/16, PharmD, BCPS, FNKF Clinical Pharmacist [place] Please utilize Amion for appropriate phone number to reach the unit pharmacist [facility])   01/31/2020 7:55 PM

## 2020-01-31 NOTE — Brief Op Note (Addendum)
01/31/2020  7:22 PM  PATIENT:  Theodore Welch  59 y.o. male  PRE-OPERATIVE DIAGNOSIS:  Sternal wound infection s/p VAD  POST-OPERATIVE DIAGNOSIS:  Sternal wound infection s/p VAD  PROCEDURE:  Procedure(s) with comments: STERNAL WOUND IRRIGATION (N/A) - MESONIC JET WOUND VAC CHANGE (N/A)  Vancomycin Irrigation  SURGEON:  Surgeon(s) and Role:    Kerin Perna, MD - Primary  PHYSICIAN ASSISTANT:   ASSISTANTS: none   ANESTHESIA:   general  EBL:  20 mL   BLOOD ADMINISTERED:none  DRAINS: none   LOCAL MEDICATIONS USED:  NONE  SPECIMEN:  No Specimen  DISPOSITION OF SPECIMEN:  N/A  COUNTS:  YES  TOURNIQUET:  * No tourniquets in log *  DICTATION: .Dragon Dictation  PLAN OF CARE: return to 2C  PATIENT DISPOSITION:  PACU - hemodynamically stable.   Delay start of Pharmacological VTE agent (>24hrs) due to surgical blood loss or risk of bleeding: yes-resume heparin in am   Findings and plan  Both wounds were cleaned with a base of good granulation tissue. Inspection and palpation of the upper wound showed no evidence of the power cord or the outflow graft. The biofilm was cleaned off the exposed velour on the power cord visible in the lower incision Because the wounds were clean ACell product was administered and wound VAC placed.  Plan will be to transition to home with wound VAC therapy and IV antibiotics with weekly wound VAC change in the VAD clinic. Low-dose Coumadin ordered for this p.m.  Lovett Sox MD

## 2020-01-31 NOTE — Progress Notes (Signed)
LVAD coordinator at bedside with patient.

## 2020-01-31 NOTE — Progress Notes (Signed)
Pre Procedure note for inpatients:   Theodore Welch has been scheduled for Procedure(s): STERNAL WOUND & driveline IRRIGATION AND DEBRIDEMENT (N/A) APPLICATION OF WOUND VAC (N/A) today. The various methods of treatment have been discussed with the patient. After consideration of the risks, benefits and treatment options the patient has consented to the planned procedure.   The patient has been seen and labs reviewed. There are no changes in the patient's condition to prevent proceeding with the planned procedure today.  Recent labs:  Lab Results  Component Value Date   WBC 7.3 01/31/2020   HGB 9.1 (L) 01/31/2020   HCT 27.7 (L) 01/31/2020   PLT 368 01/31/2020   GLUCOSE 96 01/30/2020   CHOL 97 07/21/2018   TRIG 39 07/21/2018   HDL 32 (L) 07/21/2018   LDLCALC 57 07/21/2018   ALT 14 01/28/2020   AST 17 01/28/2020   NA 140 01/30/2020   K 3.8 01/30/2020   CL 108 01/30/2020   CREATININE 0.68 01/30/2020   BUN <5 (L) 01/30/2020   CO2 23 01/30/2020   TSH 0.880 07/24/2018   INR 1.1 01/31/2020   HGBA1C 5.5 01/26/2020    Theodore Bussing, MD 01/31/2020 7:31 AM

## 2020-01-31 NOTE — Anesthesia Procedure Notes (Signed)
Procedure Name: Intubation Date/Time: 01/31/2020 5:40 PM Performed by: Adria Dill, CRNA Pre-anesthesia Checklist: Patient identified, Emergency Drugs available, Suction available and Patient being monitored Patient Re-evaluated:Patient Re-evaluated prior to induction Oxygen Delivery Method: Circle system utilized Preoxygenation: Pre-oxygenation with 100% oxygen Induction Type: IV induction Ventilation: Mask ventilation without difficulty Laryngoscope Size: Miller and 3 Grade View: Grade II Tube type: Oral Tube size: 7.5 mm Number of attempts: 1 Airway Equipment and Method: Stylet and Oral airway Placement Confirmation: ETT inserted through vocal cords under direct vision,  positive ETCO2 and breath sounds checked- equal and bilateral Secured at: 23 cm Tube secured with: Tape Dental Injury: Teeth and Oropharynx as per pre-operative assessment

## 2020-01-31 NOTE — Progress Notes (Signed)
Patient ID: Theodore Welch, male   DOB: December 20, 1960, 59 y.o.   MRN: [id number] P    Advanced Heart Failure Rounding Note  PCP-Cardiologist: No primary care provider on file.   Subjective:    To OR 3/10, lower substernal abscess I&D, did not visualize LVAD hardware but able to palpate outflow graft.  Driveline site also with I&D.  Wound vacs placed lower sternal site and driveline site, now removed due to recurrent occlusion and wounds dressed.   WBC trending down 21>16>10.9>10>8>7. Afebrile.  He has been seen by ID, now on cefazolin. Wound culture with MSSA, blood cultures negative so far.   MAP 80s-90s on current regimen.   Off warfarin for OR, INR 1.1. He is on heparin gtt 500 U/hr, no titration per Dr. Prescott Gum.   Eating and drinking better.  Few PI events.     CT abdomen: subxiphoid fluid collection with superficial and deep components, collection is along the course of the driveline. Cellulitis at surface, no evidence for sternal osteomyelitis.   LVAD: Heartmate 3. Flow 4.3 L/min, 5700 rpm, PI 5.6, power 4.3. 6 PI events last day.  LDH stable at 134.     Objective:   Weight Range: 83.6 kg Body mass index is 25.71 kg/m.   Vital Signs:   Temp:  [98 F (36.7 C)-98.3 F (36.8 C)] 98 F (36.7 C) (03/15 0800) Pulse Rate:  [67-81] 67 (03/15 0800) Resp:  [14-20] 20 (03/15 0021) BP: (95-111)/(78-100) 95/78 (03/15 0021) SpO2:  [97 %-100 %] 100 % (03/15 0800) Weight:  [83.6 kg] 83.6 kg (03/15 0500) Last BM Date: 01/29/20  Weight change: Filed Weights   01/29/20 0331 01/30/20 0450 01/31/20 0500  Weight: 84.1 kg 84.8 kg 83.6 kg    Intake/Output:   Intake/Output Summary (Last 24 hours) at 01/31/2020 0953 Last data filed at 01/31/2020 0900 Gross per 24 hour  Intake 883.78 ml  Output 300 ml  Net 583.78 ml      Physical Exam    General: Well appearing this am. NAD.  HEENT: Normal. Neck: Supple, JVP 7-8 cm. Carotids OK.  Cardiac:  Mechanical heart sounds with LVAD hum  present.  Lungs:  CTAB, normal effort.  Abdomen:  NT, ND, no HSM. No bruits or masses. +BS  LVAD exit site: Wounds at driveline site and lower sternum are dressed. Extremities:  Warm and dry. No cyanosis, clubbing, rash, or edema.  Neuro:  Alert & oriented x 3. Cranial nerves grossly intact. Moves all 4 extremities w/o difficulty. Affect pleasant     Telemetry   SR 80s -90s (personally reviewed)  Labs    CBC Recent Labs    01/30/20 0335 01/31/20 0433  WBC 8.1 7.3  HGB 9.1* 9.1*  HCT 26.9* 27.7*  MCV 92.8 93.3  PLT 349 277   Basic Metabolic Panel Recent Labs    01/29/20 0400 01/30/20 0335  NA 137 140  K 3.5 3.8  CL 104 108  CO2 23 23  GLUCOSE 92 96  BUN 6 <5*  CREATININE 0.72 0.68  CALCIUM 8.5* 8.6*   Liver Function Tests No results for input(s): AST, ALT, ALKPHOS, BILITOT, PROT, ALBUMIN in the last 72 hours. No results for input(s): LIPASE, AMYLASE in the last 72 hours. Cardiac Enzymes No results for input(s): CKTOTAL, CKMB, CKMBINDEX, TROPONINI in the last 72 hours.  BNP: BNP (last 3 results) No results for input(s): BNP in the last 8760 hours.  ProBNP (last 3 results) No results for input(s): PROBNP in the  last 8760 hours.   D-Dimer No results for input(s): DDIMER in the last 72 hours. Hemoglobin A1C No results for input(s): HGBA1C in the last 72 hours. Fasting Lipid Panel No results for input(s): CHOL, HDL, LDLCALC, TRIG, CHOLHDL, LDLDIRECT in the last 72 hours. Thyroid Function Tests No results for input(s): TSH, T4TOTAL, T3FREE, THYROIDAB in the last 72 hours.  Invalid input(s): FREET3  Other results:   Imaging    No results found.   Medications:     Scheduled Medications: . sodium chloride   Intravenous Once  . acetaminophen  1,000 mg Oral Q6H   Or  . acetaminophen (TYLENOL) oral liquid 160 mg/5 mL  1,000 mg Oral Q6H  . aspirin EC  81 mg Oral Daily  . atorvastatin  20 mg Oral Daily  . bisacodyl  10 mg Oral Daily  . DULoxetine   60 mg Oral Daily  . fluticasone  1 spray Each Nare Daily  . insulin aspart  0-15 Units Subcutaneous TID WC  . insulin aspart  0-5 Units Subcutaneous QHS  . pantoprazole  40 mg Oral Daily  . potassium chloride  40 mEq Oral Daily  . sacubitril-valsartan  1 tablet Oral BID  . senna-docusate  1 tablet Oral QHS  . sodium chloride flush  3 mL Intravenous Q12H  . spironolactone  25 mg Oral Daily  . thiamine  100 mg Oral Daily  . vitamin B-12  1,000 mcg Oral Daily  . zinc sulfate  220 mg Oral Daily    Infusions: . sodium chloride Stopped (01/30/20 0848)  . sodium chloride    . sodium chloride 10 mL/hr at 01/26/20 0300  .  ceFAZolin (ANCEF) IV 2 g (01/31/20 0546)  . heparin 600 Units/hr (01/31/20 0945)    PRN Medications: sodium chloride, sodium chloride, acetaminophen, docusate sodium, fentaNYL (SUBLIMAZE) injection, hydrALAZINE, hydrOXYzine, ipratropium-albuterol, methocarbamol, ondansetron (ZOFRAN) IV, ondansetron (ZOFRAN) IV, oxyCODONE, polyethylene glycol, sodium chloride flush, traMADol, zolpidem   Assessment/Plan   1. Subxiphoid abscess: With surrounding cellulitis.  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  CT abdomen showed subxiphoid fluid collection with superficial and deep components, collection is along the course of the driveline. Cellulitis at surface, no evidence for sternal osteomyelitis. S/P I&D 3/10, did not visualize LVAD hardware but able to palpate outflow graft.  Driveline site also with I&D.  Wound vacs placed to lower sternal site and driveline site but now removed with recurrent occlusions.  Now afebrile with WBCs down. MSSA from wound culture. Echo this admission did not show evidence for vegetations but technically difficult.  - Back to OR for repeat I&D today. It is possible that he could need muscle flap reconstruction eventually.  - Continue Cefazolin x 8 wks. He will need PICC, will likely arrange placement  tomorrow.  - ID following.  Very concerning infection, especially given prior MSSA driveline infection and already treating with suppressive Keflex. There is a possibility that this could lead to the need for pump exchange, at the least he will need extended IV treatment.  2. 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  NYHA class I-II at baseline.  Volume status stable.  MAP a bit higher 80s-90s. Fewer PI events with improved po intake.   - Continue spironolactone 25 mg daily.  - Continue Entresto, dose decreased to 49/51 with infection.    - Amlodipine has been held, probably restart tomorrow (will hold off today as he  is going to surgery).  - Continue ASA 81 daily. - Warfarin with goal INR 2-2.5, now on hold for OR.  INR 1.1 today, on low dose heparin gtt with no titration per Dr. Donata Clay.   - Should be transplant candidate eventually if he can quit smoking.  Will make transplant clinic referral when he is off totally.  3. Smoking: Cutting back.   4. H/o RLE DVT: Has been on warfarin for LVAD.  5. OSA: Continue CPAP.  6. Hyperlipidemia: Atorvastatin.  7. Type II diabetes: SSI while inpatient.    Ambulate.  Length of Stay: 6  Marca Ancona, MD  01/31/2020, 9:53 AM  Advanced Heart Failure Team Pager [phone] (M-F; 7a - 4p)  Please contact [facility] for night-coverage after hours (4p -7a ) and weekends on [email or web address]

## 2020-01-31 NOTE — Anesthesia Postprocedure Evaluation (Signed)
Anesthesia Post Note  Patient: ROBINSON BRINKLEY  Procedure(s) Performed: STERNAL WOUND IRRIGATION (N/A ) WOUND VAC CHANGE (N/A )     Patient location during evaluation: Nursing Unit Anesthesia Type: General Level of consciousness: awake and alert, patient cooperative and oriented Pain management: pain level controlled (pt says "omly a little sore") Vital Signs Assessment: post-procedure vital signs reviewed and stable Respiratory status: spontaneous breathing, nonlabored ventilation and respiratory function stable Cardiovascular status: blood pressure returned to baseline and stable (LVAD functioning well, PI 4.5) Postop Assessment: no apparent nausea or vomiting and adequate PO intake Anesthetic complications: no    Last Vitals:  Vitals:   01/31/20 1930 01/31/20 2041  BP: (!) 115/93 (!) 117/91  Pulse: 67   Resp: 19 (!) 21  Temp: 36.9 C 36.9 C  SpO2: 96%     Last Pain:  Vitals:   01/31/20 2100  TempSrc:   PainSc: 8                  Karista Aispuro,E. Sujata Maines

## 2020-01-31 NOTE — Progress Notes (Signed)
ANTICOAGULATION CONSULT NOTE  Pharmacy Consult for Heparin while Coumadin on hold Indication: LVAD  Allergies  Allergen Reactions  . Chlorhexidine Rash  . Other Rash    Prep pads    Patient Measurements: Height: 5\' 11"  (180.3 cm) Weight: 184 lb 4.9 oz (83.6 kg) IBW/kg (Calculated) : 75.3 Heparin Dosing Weight: 85 kg  Vital Signs: Temp: 98 F (36.7 C) (03/15 0424) Temp Source: Oral (03/15 0424) BP: 95/78 (03/15 0021) Pulse Rate: 77 (03/15 0021)  Labs: Recent Labs    01/29/20 0400 01/29/20 1510 01/30/20 0335 01/30/20 2024 01/31/20 0433  HGB 9.0*  --  9.1*  --  9.1*  HCT 27.1*  --  26.9*  --  27.7*  PLT 313  --  349  --  368  LABPROT 19.7*  --  16.4*  --  14.2  INR 1.7*  --  1.3*  --  1.1  HEPARINUNFRC  --    < > <0.10* <0.10* <0.10*  CREATININE 0.72  --  0.68  --   --    < > = values in this interval not displayed.    Estimated Creatinine Clearance: 104.6 mL/min (by C-G formula based on SCr of 0.68 mg/dL).   Medical History: Past Medical History:  Diagnosis Date  . 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 Cardiomyopathy, unspecified (HCC)   . CHF (congestive heart failure) (HCC)   . Chronic back pain   . Diabetes mellitus without complication (HCC)   . Enlarged heart   . Gastric ulcer   . Gastroenteritis   . H/O degenerative disc disease   . Hypertension   . LVAD (left ventricular assist device) present [facility])     Assessment: 58yom presenting with concern of large sternal abscess- starting on antibiotics. On warfarin PTA for hx LVAD (goal 2-2.5).  PTA regimen is warfarin 5 mg daily except 7.5 mg TTS.   Having bleeding from the wounds, s/p DDAVP 3/12. Hgb stable overnight  Pharmacy asked to start IV heparin 3/13 while INR below 2.  Heparin drip 600 uts/hr;  HL < 0.1. No adjustments planned as he is for OR today for further debridement.    Goal of Therapy:  Heparin level <0.3 INR 2-2.5 Monitor platelets by anticoagulation protocol: Yes   Plan:   Continue IV heparin 600 units/hr. Check heparin level daily to ensure not > 0.3 and CBC. Will f/u ability to titrate heparin to therapeutic level depending on bleeding and surgery plans  F/u plans to resume Coumadin after OR today.  4/13 PharmD., BCPS Clinical Pharmacist 01/31/2020 7:24 AM

## 2020-01-31 NOTE — Progress Notes (Signed)
Transported to short stay by bed, Lvad coordinator at bedside with pt.

## 2020-01-31 NOTE — Progress Notes (Signed)
VAD Coordinator Procedure Note:   VAD Coordinator met patient in holding room. Pt undergoing sternal wound and driveline wound washout/debridement per Dr. Prescott Gum. Hemodynamics and VAD parameters monitored by myself and anesthesia throughout the procedure. Blood pressures were obtained with automatic cuff on left arm and correlated with Doppler.    Time: Doppler Auto  BP Flow PI Power Speed  Pre-procedure:  1706  135/112(117) 4.1 5.8 4.2 5700                    Sedation Induction: 1732  124/102(109) 3.9 7.3 4.4 5700   1745  110/93(101) 4.8 3.1 4.3 5700   1800  09/4099(116) 4.5 4.7 4.3 5700   1815  85/58(67) 4.7 5.2 4.3 5700   1830  107/91(97) 4.6 4.4 4.3 5700   1845  125/79(90) 5.0 3.2 4.3 5700   1900  115/74(88) 3.9 7.6 4.5 5700                                               Recovery Area: 1920  115/92(100) 4.3 4.9 4.4 5700             Patient tolerated the procedure well. VAD Coordinator accompanied and remained with patient in recovery area.    Patient Disposition: pt was transferred to [facility] and handoff given to bedside nurse.  Tanda Rockers RN, BSN VAD Coordinator 24/7 Pager [phone]

## 2020-01-31 NOTE — Progress Notes (Signed)
LVAD Coordinator Rounding Note:  Admitted 01/25/20 per Dr. Shirlee Latch with new onset sternal abscess.   HM III LVAD implanted on 08/03/18 by Dr. Dr. Laneta Simmers under Destination Therapy criteria due to smoking status.   Pt lying in bed. Has wet/dry dressings on sternal wound and driveline.  Plan to return to OR for washout with Dr Donata Clay today at 1500.   Vital signs: Temp: 97.9 HR: 68 Doppler Pressure: 90 Automatic BP: 117/99 (106) O2 Sat: 100% RA Wt: 187.4>174.8>178>178.7>184.3  lbs   LVAD interrogation reveals:  Speed: 5700 Flow: 4.1 Power:  4.3w PI: 5.4 Alarms: none  Events:  5-10 PI daily Hematocrit: 27  Fixed speed:  5700 Low speed limit: 5400  Drive Line: CDI. PVT will change in OR today.    Labs:  LDH trend: 163>193>159>134  INR trend: 3.2>2.2>2.7>1.1  Anticoagulation Plan: -INR Goal: 2.0 - 2.5 -ASA Dose: 81 mg daily -Heparin 600 u/hr  Device: N/A  Infection:  - 01/25/20 BCs>>negative - 01/25/20 sternal wound culture>>MSSA - 01/26/20 intra-op sternal wound cultures>>MSSA  Plan/Recommendations:  1. Call VAD Coordinator if any VAD or drive line issues. 2. Will accompany pt to OR this afternoon.   [name] RN VAD Coordinator  Office: [phone]  24/7 Pager: [phone]

## 2020-01-31 NOTE — Progress Notes (Signed)
Patient updated that Dr. Alla German is still in surgery. LVAD coordinator remains at bedside. Patient alert and oriented x 4.

## 2020-01-31 NOTE — Anesthesia Preprocedure Evaluation (Addendum)
Anesthesia Evaluation  Patient identified by MRN, date of birth, ID band Patient awake    Reviewed: Allergy & Precautions, H&P , NPO status , Patient's Chart, lab work & pertinent test results  Airway Mallampati: III  TM Distance: >3 FB Neck ROM: Full    Dental no notable dental hx. (+) Teeth Intact, Dental Advisory Given   Pulmonary sleep apnea , COPD,  COPD inhaler, Current Smoker and Patient abstained from smoking.,    Pulmonary exam normal breath sounds clear to auscultation       Cardiovascular hypertension, Pt. on medications +CHF   Rhythm:Regular Rate:Normal     Neuro/Psych negative neurological ROS  negative psych ROS   GI/Hepatic Neg liver ROS, PUD,   Endo/Other  diabetes  Renal/GU negative Renal ROS  negative genitourinary   Musculoskeletal   Abdominal   Peds  Hematology negative hematology ROS (+)   Anesthesia Other Findings   Reproductive/Obstetrics negative OB ROS                            Anesthesia Physical Anesthesia Plan  ASA: IV  Anesthesia Plan: General   Post-op Pain Management:    Induction: Intravenous  PONV Risk Score and Plan: 2 and Ondansetron, Dexamethasone and Midazolam  Airway Management Planned: Oral ETT  Additional Equipment:   Intra-op Plan:   Post-operative Plan: Extubation in OR  Informed Consent: I have reviewed the patients History and Physical, chart, labs and discussed the procedure including the risks, benefits and alternatives for the proposed anesthesia with the patient or authorized representative who has indicated his/her understanding and acceptance.     Dental advisory given  Plan Discussed with: CRNA  Anesthesia Plan Comments:         Anesthesia Quick Evaluation

## 2020-02-01 ENCOUNTER — Inpatient Hospital Stay (HOSPITAL_COMMUNITY): Payer: No Typology Code available for payment source

## 2020-02-01 ENCOUNTER — Encounter: Payer: Self-pay | Admitting: *Deleted

## 2020-02-01 DIAGNOSIS — Z978 Presence of other specified devices: Secondary | ICD-10-CM

## 2020-02-01 LAB — BASIC METABOLIC PANEL
Anion gap: 10 (ref 5–15)
BUN: 6 mg/dL (ref 6–20)
CO2: 26 mmol/L (ref 22–32)
Calcium: 9.1 mg/dL (ref 8.9–10.3)
Chloride: 104 mmol/L (ref 98–111)
Creatinine, Ser: 0.82 mg/dL (ref 0.61–1.24)
GFR calc Af Amer: >60 mL/min (ref >60)
GFR calc non Af Amer: >60 mL/min (ref >60)
Glucose, Bld: 101 mg/dL — ABNORMAL HIGH (ref 70–99)
Potassium: 4.5 mmol/L (ref 3.5–5.1)
Sodium: 140 mmol/L (ref 135–145)

## 2020-02-01 LAB — CBC
HCT: 29 % — ABNORMAL LOW (ref 39.0–52.0)
Hemoglobin: 9.4 g/dL — ABNORMAL LOW (ref 13.0–17.0)
MCH: 30.3 pg (ref 26.0–34.0)
MCHC: 32.4 g/dL (ref 30.0–36.0)
MCV: 93.5 fL (ref 80.0–100.0)
Platelets: 434 10*3/uL — ABNORMAL HIGH (ref 150–400)
RBC: 3.1 MIL/uL — ABNORMAL LOW (ref 4.22–5.81)
RDW: 12.5 % (ref 11.5–15.5)
WBC: 14.5 10*3/uL — ABNORMAL HIGH (ref 4.0–10.5)
nRBC: 0 % (ref 0.0–0.2)

## 2020-02-01 LAB — PROTIME-INR
INR: 1.1 (ref 0.8–1.2)
Prothrombin Time: 14.3 seconds (ref 11.4–15.2)

## 2020-02-01 LAB — GLUCOSE, CAPILLARY
Glucose-Capillary: 104 mg/dL — ABNORMAL HIGH (ref 70–99)
Glucose-Capillary: 112 mg/dL — ABNORMAL HIGH (ref 70–99)
Glucose-Capillary: 79 mg/dL (ref 70–99)
Glucose-Capillary: 146 mg/dL — ABNORMAL HIGH (ref 70–99)
Glucose-Capillary: 117 mg/dL — ABNORMAL HIGH (ref 70–99)

## 2020-02-01 LAB — LACTATE DEHYDROGENASE: LDH: 143 U/L (ref 98–192)

## 2020-02-01 LAB — AEROBIC/ANAEROBIC CULTURE W GRAM STAIN (SURGICAL/DEEP WOUND)

## 2020-02-01 LAB — HEPARIN LEVEL (UNFRACTIONATED): Heparin Unfractionated: <0.1 IU/mL — ABNORMAL LOW (ref 0.30–0.70)

## 2020-02-01 NOTE — TOC Progression Note (Addendum)
Transition of Care [facility]) - Progression Note    Patient Details  Name: Theodore Welch MRN: [id number] Date of Birth: Mar 12, 1961  Transition of Care [facility]) CM/SW Contact  Leone Haven, RN Phone Number: 02/01/2020, 4:36 PM  Clinical Narrative:    Patient will need wound vac, NCM made referral to [facility] with [employer] for wound vac.  [facility]  [facility], NCM made April  Aware patient is here he goes to [place], PCP is [facility] , CS[facility] [employer] [phone] pager, office [phone] ext [phone].  NCM will need HHRN order for iv abx. Patient will go to office for wound vac change per MD notes.         Expected Discharge Plan and Services                                                 Social Determinants of Health (SDOH) Interventions    Readmission Risk Interventions Readmission Risk Prevention Plan 04/05/2019 04/02/2019  Transportation Screening - Complete  PCP or Specialist Appt within 3-5 Days Not Complete -  Not Complete comments earliest appt given with HF/LVAD team -  HRI or Home Care Consult - Complete  Social Work Consult for Recovery Care Planning/Counseling - Complete  Palliative Care Screening - Not Applicable  Medication Review [employer]) - Complete  Some recent data might be hidden

## 2020-02-01 NOTE — Progress Notes (Signed)
1 Day Post-Op Procedure(s) (LRB): STERNAL WOUND IRRIGATION (N/A) WOUND VAC CHANGE (N/A) Subjective: Patient comfortable today after debridement irrigation and ACell application with wound VAC placement yesterday.  Both wounds at the time of surgery were clean with a good bit of granulation tissue. The biofilm on the exposed of the lower at the power cord exit wound was removed with the Masonic jet device.  The upper lower sternal wound was also with good granulation tissue and there is no exposed power cord or outflow graft.  The plan will be for ACell incorporation and coverage of the 2 wounds with supplemental IV antibiotics for the MSSA. Objective: Vital signs in last 24 hours: Temp:  [97.7 F (36.5 C)-98.5 F (36.9 C)] 98.3 F (36.8 C) (03/16 1150) Pulse Rate:  [59-83] 83 (03/16 1150) Cardiac Rhythm: Normal sinus rhythm (03/16 1150) Resp:  [14-26] 26 (03/16 1150) BP: (95-120)/(82-105) 120/105 (03/16 1150) SpO2:  [96 %-100 %] 98 % (03/16 1150) Weight:  [81.2 kg] 81.2 kg (03/16 0500)  Hemodynamic parameters for last 24 hours:  Afebrile  Intake/Output from previous day: 03/15 0701 - 03/16 0700 In: 1909.6 [P.O.:720; I.V.:839.6; IV Piggyback:350] Out: 320 [Urine:300; Blood:20] Intake/Output this shift: Total I/O In: 376 [P.O.:240; I.V.:36; IV Piggyback:100] Out: 0   Alert and comfortable Minimal VAC drainage, no alarms no blood in sponge Normal VAD hum  Lab Results: Recent Labs    01/31/20 0433 02/01/20 0530  WBC 7.3 14.5*  HGB 9.1* 9.4*  HCT 27.7* 29.0*  PLT 368 434*   BMET:  Recent Labs    01/31/20 1030 02/01/20 0530  NA 140 140  K 4.4 4.5  CL 110 104  CO2 26 26  GLUCOSE 90 101*  BUN <5* 6  CREATININE 0.75 0.82  CALCIUM 8.8* 9.1    PT/INR:  Recent Labs    02/01/20 0549  LABPROT 14.3  INR 1.1   ABG    Component Value Date/Time   PHART 7.625 (HH) 01/25/2020 2108   HCO3 23.6 01/25/2020 2108   TCO2 26 08/04/2018 1622   O2SAT 97.9 01/25/2020 2108    CBG (last 3)  Recent Labs    02/01/20 0635 02/01/20 0731 02/01/20 1147  GLUCAP 104* 112* 79    Assessment/Plan: S/P Procedure(s) (LRB): STERNAL WOUND IRRIGATION (N/A) WOUND VAC CHANGE (N/A) Both wounds should heal by secondary intent with ACell application to augment healing. Okay to proceed with Coumadin loading on low-dose heparin.   LOS: 7 days    [name] 02/01/2020

## 2020-02-01 NOTE — Op Note (Signed)
NAME: SHOJI, PERTUIT MEDICAL RECORD WN:[id number] ACCOUNT [id number] DATE OF BIRTH:02/16/1961 FACILITY: [facility] LOCATION: [facility] PHYSICIAN:Seynabou Fults VAN TRIGT III, MD  OPERATIVE REPORT  DATE OF PROCEDURE:  01/31/2020  OPERATIONS: 1.  Lower sternal wound irrigation and placement of ACell product and wound VAC. 2.  Left ventricular assist device power cord exit site irrigation, application of ACell product, and placement of wound VAC drainage system.  SURGEON:  Kerin Perna, MD  ANESTHESIA:  General.  PREOPERATIVE DIAGNOSIS:  History of HeartMate 3 left ventricular assist device implantation with subsequent methicillin-sensitive Staphylococcus aureus infection of the power cord exit site as well as the lower portion of the sternal incision, both  involving the abdominal wall.  POSTOPERATIVE DIAGNOSIS:  History of HeartMate 3 left ventricular assist device implantation with subsequent methicillin-sensitive Staphylococcus aureus infection of the power cord exit site as well as the lower portion of the sternal incision, both  involving the abdominal wall.  DESCRIPTION OF PROCEDURE:  After informed consent was documented and final issues addressed with the patient, the patient was brought to the operating room and placed supine on the operating table.  General anesthesia was induced.  The patient remained  hemodynamically stable.  The VAD nurse coordinator was present for the entire procedure, including induction of anesthesia and wake up and transfer.  The chest and abdomen were prepped and draped as a sterile field.  A proper time-out was performed.  The lower sternal wound was inspected first.  The wound VAC had been removed.  There was a good bit of clean granulation tissue.  There was no exposed sternal bone or sternal wire.  There was no exposed power cord or outflow graft at the depth of the  wound.  The wound was irrigated with warm vancomycin irrigation.  After hemostasis was  achieved, ACell paste was placed in the depths of the wound, covered with ACell powder.  Over the ACell powder, an ACell sheet was attached to the skin edges with 3-0  Vicryl sutures, covered with a Sorbact membrane, also secured to the skin with sutures.  Sterile lubricant was placed on the Sorbact and a wound VAC sponge was cut to the appropriate size and shape for the wound and covered with wound VAC sterile skin  sheets.  Next, attention was directed to the power cord exit site.  This wound was also with clean granulation tissue, no purulence.  No sharp debridement was needed.  The wound was irrigated with warm irrigation of vancomycin and saline.  Also, the lower power  cord was cleaned with the Masonic jet irrigation device to remove the buildup of biofilm in the lower fibers on the outside of the power cord.  This resulted in a very nice clean appearance of the exposed velour.  When hemostasis was achieved,  ACell  paste was placed in the depths of the wound, covered with ACell powder.  Over the ACell powder, ACell membrane was tacked to the skin edges with Vicryl sutures and covered with a layer of Sorbact membrane.  Over the Sorbact, sterile lubricant was placed  and then the wound VAC sponge was cut to the appropriate size around the power cord and covered with the sterile wound VAC sheets.  The 2 suction catheters were placed in a Y connector to the pump and negative 125 suction was applied to both wounds.  The patient was reversed from anesthesia and returned to the recovery room in stable condition.  Estimated blood loss was 20 mL.  VN/NUANCE  D:02/01/2020 T:02/01/2020 JOB:[id number]

## 2020-02-01 NOTE — Plan of Care (Signed)
  Problem: Education: Goal: Knowledge of General Education information will improve Description: Including pain rating scale, medication(s)/side effects and non-pharmacologic comfort measures Outcome: Progressing   Problem: Education: Goal: Knowledge of General Education information will improve Description: Including pain rating scale, medication(s)/side effects and non-pharmacologic comfort measures Outcome: Progressing   Problem: Health Behavior/Discharge Planning: Goal: Ability to manage health-related needs will improve Outcome: Progressing   Problem: Clinical Measurements: Goal: Ability to maintain clinical measurements within normal limits will improve Outcome: Progressing Goal: Will remain free from infection Outcome: Progressing Goal: Diagnostic test results will improve Outcome: Progressing Goal: Respiratory complications will improve Outcome: Progressing Goal: Cardiovascular complication will be avoided Outcome: Progressing   Problem: Activity: Goal: Risk for activity intolerance will decrease Outcome: Progressing   Problem: Nutrition: Goal: Adequate nutrition will be maintained Outcome: Progressing   Problem: Coping: Goal: Level of anxiety will decrease Outcome: Progressing   Problem: Elimination: Goal: Will not experience complications related to bowel motility Outcome: Progressing Goal: Will not experience complications related to urinary retention Outcome: Progressing   Problem: Pain Managment: Goal: General experience of comfort will improve Outcome: Progressing   Problem: Safety: Goal: Ability to remain free from injury will improve Outcome: Progressing   Problem: Skin Integrity: Goal: Risk for impaired skin integrity will decrease Outcome: Progressing   Problem: Education: Goal: Patient will understand all VAD equipment and how it functions Outcome: Progressing Goal: Patient will be able to verbalize current INR target range and  antiplatelet therapy for discharge home Outcome: Progressing   Problem: Cardiac: Goal: LVAD will function as expected and patient will experience no clinical alarms Outcome: Progressing

## 2020-02-01 NOTE — Progress Notes (Signed)
ANTICOAGULATION CONSULT NOTE  Pharmacy Consult for Heparin while Coumadin on hold Indication: LVAD  Allergies  Allergen Reactions  . Chlorhexidine Rash  . Other Rash    Prep pads    Patient Measurements: Height: 5\' 11"  (180.3 cm) Weight: 179 lb 0.2 oz (81.2 kg) IBW/kg (Calculated) : 75.3 Heparin Dosing Weight: 85 kg  Vital Signs: Temp: 98.1 F (36.7 C) (03/16 0412) Temp Source: Oral (03/16 0412) BP: 95/82 (03/16 0412) Pulse Rate: 72 (03/16 0429)  Labs: Recent Labs    01/30/20 0335 01/30/20 0335 01/30/20 2024 01/31/20 0433 01/31/20 1030 02/01/20 0530 02/01/20 0549  HGB 9.1*   < >  --  9.1*  --  9.4*  --   HCT 26.9*  --   --  27.7*  --  29.0*  --   PLT 349  --   --  368  --  434*  --   LABPROT 16.4*  --   --  14.2  --   --  14.3  INR 1.3*  --   --  1.1  --   --  1.1  HEPARINUNFRC <0.10*  --  <0.10* <0.10*  --   --   --   CREATININE 0.68  --   --   --  0.75 0.82  --    < > = values in this interval not displayed.    Estimated Creatinine Clearance: 102 mL/min (by C-G formula based on SCr of 0.82 mg/dL).   Medical History: Past Medical History:  Diagnosis Date  . 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 Cardiomyopathy, unspecified (HCC)   . CHF (congestive heart failure) (HCC)   . Chronic back pain   . Diabetes mellitus without complication (HCC)   . Enlarged heart   . Gastric ulcer   . Gastroenteritis   . H/O degenerative disc disease   . Hypertension   . LVAD (left ventricular assist device) present [facility])     Assessment: 58yom presenting with concern of large sternal abscess- starting on antibiotics. On warfarin PTA for hx LVAD (goal 2-2.5).  PTA regimen is warfarin 5 mg daily except 7.5 mg TTS.   S/p I&D/driveline exploration yesterday in OR yesterday. No bleeding complications noted overnight. Hgb stable 9s, orders to restart low dose heparin this morning with no plans for titration. Low dose warfarin ordered by surgery last night. Pharmacy to follow  peripherally for now. INR 1.1 this morning.   ID: MSSA tissue/driveline infection. Scr 0.8. Planning 8 weeks of cefazolin. No dose adjustments warranted at this time.   Goal of Therapy:  Heparin level <0.3 INR 2-2.5 Monitor platelets by anticoagulation protocol: Yes   Plan:  IV heparin 600 units/hr. Check heparin level daily to ensure not > 0.3 and CBC. Will f/u ability to titrate heparin to therapeutic level depending on bleeding and surgery plans  Warfarin 2.5mg  daily per surgery  [facility] PharmD., BCPS Clinical Pharmacist 02/01/2020 7:33 AM

## 2020-02-01 NOTE — Progress Notes (Signed)
PHARMACY CONSULT NOTE FOR:  OUTPATIENT  PARENTERAL ANTIBIOTIC THERAPY (OPAT)  Indication: MSSA LVAD infection Regimen: Cefazolin (Ancef) 2 g q8h  End date: 03/27/2020  IV antibiotic discharge orders are pended. To discharging provider:  please sign these orders via discharge navigator,  Select New Orders & click on the button choice - Manage This Unsigned Work.     Thank you for allowing pharmacy to be a part of this patient's care.  Trixie Rude, PharmD Candidate  02/01/2020, 1:21 PM

## 2020-02-01 NOTE — Progress Notes (Signed)
LVAD Coordinator Rounding Note:  Admitted 01/25/20 per Dr. Shirlee Latch with new onset sternal abscess.   HM III LVAD implanted on 08/03/18 by Dr. Dr. Laneta Simmers under Destination Therapy criteria due to smoking status.   Pt ambulating in the room this morning. States that he had some pain over night but overall did well. No alarms from wound vac. Both sites are CDI-minimal drainage. WBC are up a bit from yesterday.  Vital signs: Temp: 98.1 HR: 65 Doppler Pressure: 100 Automatic BP: 113/99 (105) O2 Sat: 100% RA Wt: 187.4>174.8>178>178.7>184.3>179  lbs   LVAD interrogation reveals:  Speed: 5700 Flow: 4.8 Power:  4.4w PI: 3.7 Alarms: none  Events: 10 PI today; 50 yesterday Hematocrit: 29  Fixed speed:  5700 Low speed limit: 5400  Drive Line/sternal wound: CDI. Wound vac functioning well, no alarms. Minimal drainage.   Labs:  LDH trend: 163>193>159>134>143  INR trend: 3.2>2.2>2.7>1.1  Anticoagulation Plan: -INR Goal: 2.0 - 2.5 -ASA Dose: 81 mg daily -Heparin 600 u/hr-restarted at 0800 this morning -Coumadin restarted last night 3/15  Device: N/A  Infection:  - 01/25/20 BCs>>negative - 01/25/20 sternal wound culture>>MSSA - 01/26/20 intra-op sternal wound cultures>>MSSA  Plan/Recommendations:  1. Call VAD Coordinator if any VAD or drive line issues.  [name] RN VAD Coordinator  Office: [phone]  24/7 Pager: [phone]

## 2020-02-01 NOTE — Progress Notes (Signed)
[place] for Infectious Disease  Date of Admission:  01/25/2020     Total days of antibiotics 8         ASSESSMENT:  Theodore Welch is POD #1 from repeat sternal wound I&D with wound vac change. Continues to do well without complications. Cultures as previously noted positive for MSSA. Will plan for 8 weeks of IV treatment with Cefazolin and return to oral suppression with cephalexin. He was previously on tid dosing and may need qid dosing going forward. PICC line to be placed tomorrow. OPAT orders placed. Continue wound care per CVTS. Continue current dose of Cefazolin and will arrange follow up in ID clinic.   ID will sign off.   PLAN:  1. Continue Cefazolin through 5/10. 2. Wound care per CVTS.  3. PICC line placement prior to discharge. 4. Follow up in ID office in 4 weeks.   Diagnosis: MSSA Infection complicating LVAD   Culture Result: MSSA   Allergies  Allergen Reactions  . Chlorhexidine Rash  . Other Rash    Prep pads    OPAT Orders Discharge antibiotics: Cefazolin  Per pharmacy protocol  Aim for Vancomycin trough 15-20 or AUC 400-550 (unless otherwise indicated) Duration: 8 weeks  End Date: 03/27/20  [facility] Care Per Protocol:  Home health RN for IV administration and teaching; PICC line care and labs.    Labs weekly while on IV antibiotics: _X_ CBC with differential _X_ BMP __ CMP _X_ CRP _X_ ESR __ Vancomycin trough __ CK  __ Please pull PIC at completion of IV antibiotics _X_ Please leave PIC in place until doctor has seen patient or been notified  Fax weekly labs to [phone]  Clinic Follow Up Appt:  03/01/20 at 2:30 pm with Janene Madeira, NP    Principal Problem:   Staph aureus infection Active Problems:   Complication involving left ventricular assist device (LVAD)   Abscess of sternal region   . sodium chloride   Intravenous Once  . acetaminophen  1,000 mg Oral Q6H   Or  . acetaminophen (TYLENOL) oral liquid 160 mg/5 mL   1,000 mg Oral Q6H  . aspirin EC  81 mg Oral Daily  . atorvastatin  20 mg Oral Daily  . bisacodyl  10 mg Oral Daily  . DULoxetine  60 mg Oral Daily  . fluticasone  1 spray Each Nare Daily  . insulin aspart  0-15 Units Subcutaneous TID WC  . insulin aspart  0-5 Units Subcutaneous QHS  . pantoprazole  40 mg Oral Daily  . sacubitril-valsartan  1 tablet Oral BID  . senna-docusate  1 tablet Oral QHS  . sodium chloride flush  10-40 mL Intracatheter Q12H  . sodium chloride flush  3 mL Intravenous Q12H  . spironolactone  25 mg Oral Daily  . thiamine  100 mg Oral Daily  . vitamin B-12  1,000 mcg Oral Daily  . warfarin  2.5 mg Oral q1800  . Warfarin - Physician Dosing Inpatient   Does not apply q1800  . zinc sulfate  220 mg Oral Daily    SUBJECTIVE:  Afebrile overnight with no acute events. Feeling good today.   Allergies  Allergen Reactions  . Chlorhexidine Rash  . Other Rash    Prep pads     Review of Systems: Review of Systems  Constitutional: Negative for chills, fever and weight loss.  Respiratory: Negative for cough, shortness of breath and wheezing.   Cardiovascular: Negative for chest pain and leg swelling.  Gastrointestinal: Negative for abdominal pain, constipation, diarrhea, nausea and vomiting.  Skin: Negative for rash.      OBJECTIVE: Vitals:   02/01/20 0412 02/01/20 0429 02/01/20 0500 02/01/20 0735  BP: 95/82   (!) 113/99  Pulse: 70 72    Resp: (!) 21 20    Temp: 98.1 F (36.7 C)   98.1 F (36.7 C)  TempSrc: Oral   Oral  SpO2: 99% 100%    Weight:   81.2 kg   Height:       Body mass index is 24.97 kg/m.  Physical Exam Constitutional:      General: He is not in acute distress.    Appearance: He is well-developed.  Cardiovascular:     Rate and Rhythm: Normal rate and regular rhythm.     Comments: LVAD hum Pulmonary:     Effort: Pulmonary effort is normal.     Breath sounds: Normal breath sounds.  Skin:    General: Skin is warm and dry.    Neurological:     Mental Status: He is alert and oriented to person, place, and time.  Psychiatric:        Behavior: Behavior normal.        Thought Content: Thought content normal.        Judgment: Judgment normal.     Lab Results Lab Results  Component Value Date   WBC 14.5 (H) 02/01/2020   HGB 9.4 (L) 02/01/2020   HCT 29.0 (L) 02/01/2020   MCV 93.5 02/01/2020   PLT 434 (H) 02/01/2020    Lab Results  Component Value Date   CREATININE 0.82 02/01/2020   BUN 6 02/01/2020   NA 140 02/01/2020   K 4.5 02/01/2020   CL 104 02/01/2020   CO2 26 02/01/2020    Lab Results  Component Value Date   ALT 14 01/28/2020   AST 17 01/28/2020   ALKPHOS 64 01/28/2020   BILITOT 0.8 01/28/2020     Microbiology: Recent Results (from the past 240 hour(s))  Culture, blood (Routine X 2) w Reflex to ID Panel     Status: None   Collection Time: 01/25/20 11:00 AM   Specimen: BLOOD LEFT WRIST  Result Value Ref Range Status   Specimen Description BLOOD LEFT WRIST  Final   Special Requests   Final    BOTTLES DRAWN AEROBIC AND ANAEROBIC Blood Culture adequate volume   Culture   Final    NO GROWTH 5 DAYS Performed at [facility], [address], [place], [place] [address]    Report Status 01/30/2020 FINAL  Final  Culture, blood (routine x 2)     Status: None   Collection Time: 01/25/20 12:59 PM   Specimen: BLOOD RIGHT WRIST  Result Value Ref Range Status   Specimen Description BLOOD RIGHT WRIST  Final   Special Requests   Final    BOTTLES DRAWN AEROBIC AND ANAEROBIC Blood Culture adequate volume   Culture   Final    NO GROWTH 5 DAYS Performed at [facility], [place] [address] [place], [place] [address]    Report Status 01/30/2020 FINAL  Final  Aerobic Culture (superficial specimen)     Status: None   Collection Time: 01/25/20  3:14 PM   Specimen: Wound  Result Value Ref Range Status   Specimen Description WOUND CHEST  Final   Special Requests NONE  Final   Gram Stain    Final    MODERATE WBC PRESENT, PREDOMINANTLY PMN RARE  GRAM POSITIVE COCCI Performed at [facility], [place] [address], [place], [place] [address]    Culture FEW STAPHYLOCOCCUS AUREUS  Final   Report Status 01/28/2020 FINAL  Final   Organism ID, Bacteria STAPHYLOCOCCUS AUREUS  Final      Susceptibility   Staphylococcus aureus - MIC*    CIPROFLOXACIN <=0.5 SENSITIVE Sensitive     ERYTHROMYCIN >=8 RESISTANT Resistant     GENTAMICIN <=0.5 SENSITIVE Sensitive     OXACILLIN 0.5 SENSITIVE Sensitive     TETRACYCLINE 2 SENSITIVE Sensitive     VANCOMYCIN 1 SENSITIVE Sensitive     TRIMETH/SULFA <=10 SENSITIVE Sensitive     CLINDAMYCIN RESISTANT Resistant     RIFAMPIN <=0.5 SENSITIVE Sensitive     Inducible Clindamycin POSITIVE Resistant     * FEW STAPHYLOCOCCUS AUREUS  SARS CORONAVIRUS 2 (TAT 6-24 HRS) Nasopharyngeal Nasopharyngeal Swab     Status: None   Collection Time: 01/25/20  9:40 PM   Specimen: Nasopharyngeal Swab  Result Value Ref Range Status   SARS Coronavirus 2 NEGATIVE NEGATIVE Final    Comment: (NOTE) SARS-CoV-2 target nucleic acids are NOT DETECTED. The SARS-CoV-2 RNA is generally detectable in upper and lower respiratory specimens during the acute phase of infection. Negative results do not preclude SARS-CoV-2 infection, do not rule out co-infections with other pathogens, and should not be used as the sole basis for treatment or other patient management decisions. Negative results must be combined with clinical observations, patient history, and epidemiological information. The expected result is Negative. Fact Sheet for Patients: SugarRoll.be Fact Sheet for Healthcare Providers: https://www.woods-mathews.com/ This test is not yet approved or cleared by the [place] FDA and  has been authorized for detection and/or diagnosis of SARS-CoV-2 by FDA under an Emergency Use Authorization (EUA). This EUA will remain  in effect  (meaning this test can be used) for the duration of the COVID-19 declaration under Section 56 4(b)(1) of the Act, 21 U.S.C. section 360bbb-3(b)(1), unless the authorization is terminated or revoked sooner. Performed at [facility], [place] [address], [place], [place] [address]   MRSA PCR Screening     Status: None   Collection Time: 01/25/20  9:46 PM   Specimen: Nasal Mucosa; Nasopharyngeal  Result Value Ref Range Status   MRSA by PCR NEGATIVE NEGATIVE Final    Comment:        The GeneXpert MRSA Assay (FDA approved for NASAL specimens only), is one component of a comprehensive MRSA colonization surveillance program. It is not intended to diagnose MRSA infection nor to guide or monitor treatment for MRSA infections. Performed at [facility], [place] [address] [place],  [address]   Fungus Culture With Stain     Status: None (Preliminary result)   Collection Time: 01/26/20  9:15 AM   Specimen: PATH Soft tissue  Result Value Ref Range Status   Fungus Stain Final report  Final    Comment: (NOTE) Performed At: [facility] [place], [place] [id number] Rush Farmer MD OZ:[id number]    Fungus (Mycology) Culture PENDING  Incomplete   Fungal Source TISSUE  Final    Comment: STERNAL WOUND IRRIGATION AND DEBRIDGEMENT  Aerobic/Anaerobic Culture (surgical/deep wound)     Status: None (Preliminary result)   Collection Time: 01/26/20  9:15 AM   Specimen: PATH Soft tissue  Result Value Ref Range Status   Specimen Description TISSUE STERNAL WOUND IRRIGATION AND [place]  Final   Special Requests A  Final   Gram  Stain   Final    FEW WBC PRESENT, PREDOMINANTLY PMN NO ORGANISMS SEEN Performed at [facility], [place] [address] [place], [place] [address]    Culture FEW STAPHYLOCOCCUS AUREUS NO ANAEROBES ISOLATED   Final   Report Status PENDING  Incomplete   Organism ID, Bacteria STAPHYLOCOCCUS AUREUS  Final      Susceptibility    Staphylococcus aureus - MIC*    CIPROFLOXACIN <=0.5 SENSITIVE Sensitive     ERYTHROMYCIN >=8 RESISTANT Resistant     GENTAMICIN <=0.5 SENSITIVE Sensitive     OXACILLIN 0.5 SENSITIVE Sensitive     TETRACYCLINE 2 SENSITIVE Sensitive     VANCOMYCIN <=0.5 SENSITIVE Sensitive     TRIMETH/SULFA <=10 SENSITIVE Sensitive     CLINDAMYCIN RESISTANT Resistant     RIFAMPIN <=0.5 SENSITIVE Sensitive     Inducible Clindamycin POSITIVE Resistant     * FEW STAPHYLOCOCCUS AUREUS  Acid Fast Smear (AFB)     Status: None   Collection Time: 01/26/20  9:15 AM   Specimen: PATH Soft tissue  Result Value Ref Range Status   AFB Specimen Processing Concentration  Final   Acid Fast Smear Negative  Final    Comment: (NOTE) Performed At: [facility] [place], [place] [id number] Rush Farmer MD IY:[id number]    Source (AFB) TISSUE  Final    Comment: STERNAL WOUND IRRIGATION AND DEBRIDGEMENTTIS STERNAL WOUND IRRIGATION AND DEBRIDGEMENT   Fungus Culture Result     Status: None   Collection Time: 01/26/20  9:15 AM  Result Value Ref Range Status   Result 1 Comment  Final    Comment: (NOTE) KOH/Calcofluor preparation:  no fungus observed. Performed At: [facility] [address] [place], [place] [id number] Rush Farmer MD [id number]      Terri Piedra, [place] [facility] [phone] Pager  02/01/2020  9:44 AM

## 2020-02-01 NOTE — Progress Notes (Addendum)
Patient ID: Theodore Welch, male   DOB: 1961/06/25, 59 y.o.   MRN: [id number] P    Advanced Heart Failure Rounding Note  PCP-Cardiologist: No primary care provider on file.   Subjective:    To OR 3/10, lower substernal abscess I&D, did not visualize LVAD hardware but able to palpate outflow graft.  Driveline site also with I&D.  Wound vacs placed lower sternal site and driveline site, now removed due to recurrent occlusion and wounds dressed. Back to OR 3/15 for repeat I&D, 2 wound vacs again placed.   WBC trending down 21>16>10.9>10>8>7>14.3   ID following.  Now on cefazolin. Wound culture with MSSA, blood cultures negative so far.   CT abdomen: subxiphoid fluid collection with superficial and deep components, collection is along the course of the driveline. Cellulitis at surface, no evidence for sternal osteomyelitis.   Denies SOB.   LVAD: Heartmate 3. Flow 4.4 L/min, 5700 rpm, PI 4, power 4.5. 7 PI events last day.  LDH stable at 134.     Objective:   Weight Range: 81.2 kg Body mass index is 24.97 kg/m.   Vital Signs:   Temp:  [97.7 F (36.5 C)-98.5 F (36.9 C)] 98.1 F (36.7 C) (03/16 0412) Pulse Rate:  [59-79] 72 (03/16 0429) Resp:  [14-25] 20 (03/16 0429) BP: (95-120)/(82-103) 95/82 (03/16 0412) SpO2:  [96 %-100 %] 100 % (03/16 0429) Weight:  [81.2 kg] 81.2 kg (03/16 0500) Last BM Date: 01/30/20  Weight change: Filed Weights   01/30/20 0450 01/31/20 0500 02/01/20 0500  Weight: 84.8 kg 83.6 kg 81.2 kg    Intake/Output:   Intake/Output Summary (Last 24 hours) at 02/01/2020 0713 Last data filed at 02/01/2020 0010 Gross per 24 hour  Intake 1909.58 ml  Output 320 ml  Net 1589.58 ml      Physical Exam    Physical Exam: GENERAL: No acute distress. HEENT: normal  NECK: Supple, JVP flat .  2+ bilaterally, no bruits.  No lymphadenopathy or thyromegaly appreciated.  RIJ CARDIAC:  Mechanical heart sounds with LVAD hum present. VAC dressing sternum  LUNGS:  Clear  to auscultation bilaterally.  ABDOMEN:  Soft, round, nontender, positive bowel sounds x4.     LVAD exit site:VAC dressing around driveline.  EXTREMITIES:  Warm and dry, no cyanosis, clubbing, rash or edema  NEUROLOGIC:  Alert and oriented x 4.  Gait steady.  No aphasia.  No dysarthria.  Affect pleasant.       Telemetry   SR 70-80s   Labs    CBC Recent Labs    01/31/20 0433 02/01/20 0530  WBC 7.3 14.5*  HGB 9.1* 9.4*  HCT 27.7* 29.0*  MCV 93.3 93.5  PLT 368 355*   Basic Metabolic Panel Recent Labs    01/31/20 1030 02/01/20 0530  NA 140 140  K 4.4 4.5  CL 110 104  CO2 26 26  GLUCOSE 90 101*  BUN <5* 6  CREATININE 0.75 0.82  CALCIUM 8.8* 9.1   Liver Function Tests No results for input(s): AST, ALT, ALKPHOS, BILITOT, PROT, ALBUMIN in the last 72 hours. No results for input(s): LIPASE, AMYLASE in the last 72 hours. Cardiac Enzymes No results for input(s): CKTOTAL, CKMB, CKMBINDEX, TROPONINI in the last 72 hours.  BNP: BNP (last 3 results) No results for input(s): BNP in the last 8760 hours.  ProBNP (last 3 results) No results for input(s): PROBNP in the last 8760 hours.   D-Dimer No results for input(s): DDIMER in the last 72 hours. Hemoglobin  A1C No results for input(s): HGBA1C in the last 72 hours. Fasting Lipid Panel No results for input(s): CHOL, HDL, LDLCALC, TRIG, CHOLHDL, LDLDIRECT in the last 72 hours. Thyroid Function Tests No results for input(s): TSH, T4TOTAL, T3FREE, THYROIDAB in the last 72 hours.  Invalid input(s): FREET3  Other results:   Imaging    No results found.   Medications:     Scheduled Medications: . sodium chloride   Intravenous Once  . acetaminophen  1,000 mg Oral Q6H   Or  . acetaminophen (TYLENOL) oral liquid 160 mg/5 mL  1,000 mg Oral Q6H  . aspirin EC  81 mg Oral Daily  . atorvastatin  20 mg Oral Daily  . bisacodyl  10 mg Oral Daily  . bisacodyl  10 mg Oral Daily  . DULoxetine  60 mg Oral Daily  .  fluticasone  1 spray Each Nare Daily  . insulin aspart  0-15 Units Subcutaneous TID WC  . insulin aspart  0-5 Units Subcutaneous QHS  . pantoprazole  40 mg Oral Daily  . potassium chloride  40 mEq Oral Daily  . sacubitril-valsartan  1 tablet Oral BID  . senna-docusate  1 tablet Oral QHS  . senna-docusate  1 tablet Oral QHS  . sodium chloride flush  10-40 mL Intracatheter Q12H  . sodium chloride flush  3 mL Intravenous Q12H  . spironolactone  25 mg Oral Daily  . thiamine  100 mg Oral Daily  . vitamin B-12  1,000 mcg Oral Daily  . warfarin  2.5 mg Oral q1800  . Warfarin - Physician Dosing Inpatient   Does not apply q1800  . zinc sulfate  220 mg Oral Daily    Infusions: . sodium chloride Stopped (01/30/20 0848)  . sodium chloride    . sodium chloride Stopped (01/31/20 1915)  .  ceFAZolin (ANCEF) IV 2 g (02/01/20 0533)  . heparin    . lactated ringers 10 mL/hr at 01/31/20 1434  . lactated ringers 40 mL/hr at 01/31/20 2041    PRN Medications: sodium chloride, sodium chloride, acetaminophen, docusate sodium, fentaNYL (SUBLIMAZE) injection, hydrALAZINE, hydrOXYzine, ipratropium-albuterol, methocarbamol, ondansetron (ZOFRAN) IV, ondansetron (ZOFRAN) IV, ondansetron (ZOFRAN) IV, oxyCODONE, polyethylene glycol, sodium chloride flush, sodium chloride flush, traMADol, zolpidem   Assessment/Plan   1. Subxiphoid abscess: With surrounding cellulitis.  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  CT abdomen showed subxiphoid fluid collection with superficial and deep components, collection is along the course of the driveline. Cellulitis at surface, no evidence for sternal osteomyelitis. S/P I&D 3/10, did not visualize LVAD hardware but able to palpate outflow graft.  Driveline site also with I&D.  Wound vacs placed to lower sternal site and driveline site but now removed with recurrent occlusions.  Now afebrile with WBCs down. MSSA from wound  culture. Echo this admission did not show evidence for vegetations but technically difficult.  - S/P  I&D 3/15, 2 wound vacs placed.  - Continue Cefazolin x 8 wks. He will need PICC, will likely arrange placement tomorrow. WBC 14 today.  - ID following.  Very concerning infection, especially given prior MSSA driveline infection and already treating with suppressive Keflex. There is a possibility that this could lead to the need for pump exchange, at the least he will need extended IV treatment.  2. 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  NYHA class I-II at baseline.   - Volume status stable.   Maps  70-80s . Fewer PI events with improved po intake.   - Continue spironolactone 25 mg daily.  - Continue Entresto, dose decreased to 49/51 with infection.    - Amlodipine has been held. May restart tomorrow.  - Continue ASA 81 daily. - Warfarin with goal INR 2-2.5, now on hold for OR.  INR 1.1 today, start heparin drip at 0800.  - Should be transplant candidate eventually if he can quit smoking.  Will make transplant clinic referral when he is off totally.  3. Smoking: Cutting back.   4. H/o RLE DVT: Has been on warfarin for LVAD.  5. OSA: Continue CPAP.  6. Hyperlipidemia: Atorvastatin.  7. Type II diabetes: SSI while inpatient.    Place PICC next 24 hours. Stop LR.   Length of Stay: 7  Amy Clegg, NP  02/01/2020, 7:13 AM  Advanced Heart Failure Team Pager [phone] (M-F; 7a - 4p)  Please contact [facility] for night-coverage after hours (4p -7a ) and weekends on [email or web address]  Patient seen with NP, agree with the above note.  Patient underwent repeat I&D 3/15, 2 wound vacs again placed.  He will go home with wound vacs.   INR 1.1 today, he has been restarted on warfarin and will start coumadin this evening.   He will continue cefazolin to 5/10, will likely place PICC tomorrow and remove IJ central line.   Marca Ancona 02/01/2020 1:07 PM

## 2020-02-01 NOTE — Progress Notes (Signed)
  Consult placed for TOC.   He will need VAC at discharge with supplies for dressing changes.   Plan to change VAC dressing in the LVAD clinic per Dr Maren Beach  Please arrange.   Maciej Schweitzer NP-C  2:27 PM

## 2020-02-02 ENCOUNTER — Encounter (HOSPITAL_COMMUNITY): Payer: No Typology Code available for payment source

## 2020-02-02 ENCOUNTER — Inpatient Hospital Stay: Payer: Self-pay

## 2020-02-02 LAB — BASIC METABOLIC PANEL
Anion gap: 9 (ref 5–15)
BUN: 5 mg/dL — ABNORMAL LOW (ref 6–20)
CO2: 27 mmol/L (ref 22–32)
Calcium: 8.9 mg/dL (ref 8.9–10.3)
Chloride: 107 mmol/L (ref 98–111)
Creatinine, Ser: 0.95 mg/dL (ref 0.61–1.24)
GFR calc Af Amer: >60 mL/min (ref >60)
GFR calc non Af Amer: >60 mL/min (ref >60)
Glucose, Bld: 95 mg/dL (ref 70–99)
Potassium: 3.9 mmol/L (ref 3.5–5.1)
Sodium: 143 mmol/L (ref 135–145)

## 2020-02-02 LAB — PROTIME-INR
INR: 1.1 (ref 0.8–1.2)
Prothrombin Time: 14.3 seconds (ref 11.4–15.2)

## 2020-02-02 LAB — CBC
HCT: 28 % — ABNORMAL LOW (ref 39.0–52.0)
Hemoglobin: 8.9 g/dL — ABNORMAL LOW (ref 13.0–17.0)
MCH: 30.2 pg (ref 26.0–34.0)
MCHC: 31.8 g/dL (ref 30.0–36.0)
MCV: 94.9 fL (ref 80.0–100.0)
Platelets: 413 10*3/uL — ABNORMAL HIGH (ref 150–400)
RBC: 2.95 MIL/uL — ABNORMAL LOW (ref 4.22–5.81)
RDW: 12.8 % (ref 11.5–15.5)
WBC: 9.4 10*3/uL (ref 4.0–10.5)
nRBC: 0 % (ref 0.0–0.2)

## 2020-02-02 LAB — LACTATE DEHYDROGENASE: LDH: 136 U/L (ref 98–192)

## 2020-02-02 LAB — GLUCOSE, CAPILLARY
Glucose-Capillary: 95 mg/dL (ref 70–99)
Glucose-Capillary: 102 mg/dL — ABNORMAL HIGH (ref 70–99)
Glucose-Capillary: 113 mg/dL — ABNORMAL HIGH (ref 70–99)
Glucose-Capillary: 113 mg/dL — ABNORMAL HIGH (ref 70–99)

## 2020-02-02 LAB — HEPARIN LEVEL (UNFRACTIONATED): Heparin Unfractionated: <0.1 IU/mL — ABNORMAL LOW (ref 0.30–0.70)

## 2020-02-02 MED ORDER — AMLODIPINE BESYLATE 5 MG PO TABS
5.0000 mg | ORAL_TABLET | Freq: Every day | ORAL | Status: DC
  Administered 2020-02-02: 5 mg via ORAL
  Filled 2020-02-02: qty 1

## 2020-02-02 MED ORDER — WARFARIN SODIUM 4 MG PO TABS: 4.0000 mg | ORAL_TABLET | Freq: Every day | ORAL | Status: DC

## 2020-02-02 MED ORDER — WARFARIN SODIUM 5 MG PO TABS
5.0000 mg | ORAL_TABLET | Freq: Every day | ORAL | Status: DC
  Administered 2020-02-02: 5 mg via ORAL
  Filled 2020-02-02: qty 1

## 2020-02-02 MED ORDER — SACUBITRIL-VALSARTAN 97-103 MG PO TABS
1.0000 | ORAL_TABLET | Freq: Two times a day (BID) | ORAL | Status: DC
  Administered 2020-02-02 – 2020-02-09 (×14): 1 via ORAL
  Filled 2020-02-02 (×15): qty 1

## 2020-02-02 NOTE — TOC Initial Note (Addendum)
Transition of Care [facility]) - Initial/Assessment Note    Patient Details  Name: Theodore Welch MRN: [id number] Date of Birth: 1961-01-20  Transition of Care [facility]) CM/SW Contact:    Leone Haven, RN Phone Number: 02/02/2020, 10:10 AM  Clinical Narrative:                 Patient is from home , he will go home with wound vac , [facility] will be providing the wound Vac, but the dressing change for the wound Vac will be done at the office per MD note.  Patient will also go home on iv abx, for picc placement today.  NCM offered choice for [facility], he chose [facility], states he has always used [facility].  NCM made referral to [facility] with [facility]. Awaiting for call back to see if she can take referral.  NCM contacted Marquita at the [facility] and left message for a return call and also paged her.  NCM also left message with Gwynneth Aliment, another CSW at the [place] for return call.  Pam Chadler with [facility] will supply the iv medication. Patient states he knows how to infuse the abx , he has done this before.  NCM awaiting all back from the [place].  1402- NCM receved call from [facility] with the [place] , she states to fax the orders to [phone] to Dr. Marko Stai,  NCM gave her [place] Ana with [employer] and [facility] phone numbers as well so she can contact them regarding the iv medication and the wound vac.   1420- NCM received call from [facility]  , the Nurse with the [place]  [phone] she states she will call Lupita Leash with [facility] to give her auth for [facility] and she will call Jeri Modena to give her auth.    Expected Discharge Plan: Home w Home Health Services Barriers to Discharge: Continued Medical Work up   Patient Goals and CMS Choice Patient states their goals for this hospitalization and ongoing recovery are:: get better CMS Medicare.gov Compare Post Acute Care list provided to:: Patient Choice offered to / list presented to : Patient  Expected Discharge Plan and Services Expected Discharge Plan: Home w Home Health Services   Discharge  Planning Services: CM Consult Post Acute Care Choice: Home Health Living arrangements for the past 2 months: Single Family Home                 DME Arranged: (NA)         HH Arranged: RN HH Agency: [facility] Date HH Agency Contacted: 02/02/20 Time HH Agency Contacted: 1009 Representative spoke with at [facility] Agency: Lupita Leash  Prior Living Arrangements/Services Living arrangements for the past 2 months: Single Family Home Lives with:: Spouse Patient language and need for interpreter reviewed:: Yes Do you feel safe going back to the place where you live?: Yes      Need for Family Participation in Patient Care: Yes (Comment) Care giver support system in place?: Yes (comment)   Criminal Activity/Legal Involvement Pertinent to Current Situation/Hospitalization: No - Comment as needed  Activities of Daily Living Home Assistive Devices/Equipment: Cane (specify quad or straight), Oxygen, CPAP ADL Screening (condition at time of admission) Patient's cognitive ability adequate to safely complete daily activities?: Yes Is the patient deaf or have difficulty hearing?: No Does the patient have difficulty seeing, even when wearing glasses/contacts?: No Does the patient have difficulty concentrating, remembering, or making decisions?: No Patient able to express need for assistance with ADLs?:  Yes Does the patient have difficulty dressing or bathing?: No Independently performs ADLs?: Yes (appropriate for developmental age) Does the patient have difficulty walking or climbing stairs?: Yes Weakness of Legs: Both Weakness of Arms/Hands: None  Permission Sought/Granted                  Emotional Assessment   Attitude/Demeanor/Rapport: Engaged Affect (typically observed): Appropriate Orientation: : Oriented to Self, Oriented to Place, Oriented to  Time, Oriented to Situation Alcohol / Substance Use: Not Applicable Psych Involvement: No (comment)  Admission diagnosis:   Complication involving left ventricular assist device (LVAD) [S17.9XXA] Patient Active Problem List   Diagnosis Date Noted  . Abscess of sternal region 01/27/2020  . Staph aureus infection 01/27/2020  . Vaccine counseling 08/12/2019  . Infection associated with driveline of left ventricular assist device (LVAD) ([place]) 11/28/2018  . Chronic systolic heart failure ([place]) 11/28/2018  . Complication involving left ventricular assist device (LVAD) 11/23/2018  . OSA (obstructive sleep apnea) 09/02/2018  . LVAD (left ventricular assist device) present ([place]) 08/03/2018  . DVT (deep venous thrombosis) ([place]) 08/02/2018  . Goals of care, counseling/discussion   . Advance care planning   . Palliative care encounter   . Malnutrition of moderate degree 07/25/2018  . Ascites   . COPD (chronic obstructive pulmonary disease) ([place]) 07/21/2018  . HTN (hypertension) 07/21/2018  . Tobacco use 07/21/2018  . Congestive heart failure ([place]) 07/21/2018  . CHF (congestive heart failure) ([place]) 07/21/2018  . Type 2 diabetes mellitus ([place]) 07/21/2018  . Hyperlipidemia 07/21/2018  . Fibromyalgia syndrome 01/27/2017  . Myofascial pain syndrome, cervical 01/27/2017  . Lumbar radiculopathy 01/24/2014  . History of lumbar laminectomy for spinal cord decompression 12/07/2013  . 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   PCP:  Reubin Milan, MD Pharmacy:   [place] [place] [facility] [address] [place] [address] Phone: [phone] Fax: [phone]     Social Determinants of Health (SDOH) Interventions    Readmission Risk Interventions Readmission Risk Prevention Plan 04/05/2019 04/02/2019  Transportation Screening - Complete  PCP or Specialist Appt within 3-5 Days Not Complete -  Not Complete comments earliest appt given with HF/LVAD team -  [place] or [place] - Complete  Social Work Consult for [place] Planning/Counseling - Complete  Palliative Care Screening - Not Applicable  Medication Review Press photographer) - Complete  Some recent data might be hidden

## 2020-02-02 NOTE — Plan of Care (Signed)
  Problem: Education: Goal: Knowledge of General Education information will improve Description: Including pain rating scale, medication(s)/side effects and non-pharmacologic comfort measures Outcome: Progressing   Problem: Health Behavior/Discharge Planning: Goal: Ability to manage health-related needs will improve Outcome: Progressing   Problem: Clinical Measurements: Goal: Diagnostic test results will improve Outcome: Progressing Goal: Respiratory complications will improve Outcome: Progressing Goal: Cardiovascular complication will be avoided Outcome: Progressing   Problem: Activity: Goal: Risk for activity intolerance will decrease Outcome: Progressing   Problem: Nutrition: Goal: Adequate nutrition will be maintained Outcome: Progressing   Problem: Coping: Goal: Level of anxiety will decrease Outcome: Progressing   Problem: Pain Managment: Goal: General experience of comfort will improve Outcome: Progressing   Problem: Safety: Goal: Ability to remain free from injury will improve Outcome: Progressing   

## 2020-02-02 NOTE — Progress Notes (Signed)
ANTICOAGULATION CONSULT NOTE  Pharmacy Consult for Heparin to Coumadin Indication: LVAD  Allergies  Allergen Reactions  . Chlorhexidine Rash  . Other Rash    Prep pads    Patient Measurements: Height: 5\' 11"  (180.3 cm) Weight: 181 lb 7 oz (82.3 kg)(scale A) IBW/kg (Calculated) : 75.3 Heparin Dosing Weight: 85 kg  Vital Signs: Temp: 98 F (36.7 C) (03/17 0327) Temp Source: Axillary (03/17 0327) BP: 113/93 (03/17 0500) Pulse Rate: 72 (03/16 1936)  Labs: Recent Labs    01/31/20 0433 01/31/20 0433 01/31/20 1030 02/01/20 0530 02/01/20 0549 02/01/20 1735 02/02/20 0538 02/02/20 0539  HGB 9.1*   < >  --  9.4*  --   --  8.9*  --   HCT 27.7*  --   --  29.0*  --   --  28.0*  --   PLT 368  --   --  434*  --   --  413*  --   LABPROT 14.2  --   --   --  14.3  --  14.3  --   INR 1.1  --   --   --  1.1  --  1.1  --   HEPARINUNFRC <0.10*  --   --   --   --  <0.10*  --  <0.10*  CREATININE  --   --  0.75 0.82  --   --  0.95  --    < > = values in this interval not displayed.    Estimated Creatinine Clearance: 88.1 mL/min (by C-G formula based on SCr of 0.95 mg/dL).   Medical History: Past Medical History:  Diagnosis Date  . 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 Cardiomyopathy, unspecified (HCC)   . CHF (congestive heart failure) (HCC)   . Chronic back pain   . Diabetes mellitus without complication (HCC)   . Enlarged heart   . Gastric ulcer   . Gastroenteritis   . H/O degenerative disc disease   . Hypertension   . LVAD (left ventricular assist device) present [facility])     Assessment: 58yom presenting with concern of large sternal abscess- starting on antibiotics. On warfarin PTA for hx LVAD (goal 2-2.5).  PTA regimen is warfarin 5 mg daily except 7.5 mg TTS.   S/p I&D/driveline exploration yesterday in OR 3/15. No bleeding complications noted. Hgb stable 8.9. LDH stable 413.   Heparin level undetectable as expected on low dose heparin. INR unchanged at 1.1. Discussed  with surgery, will make slightly increase in IV heparin rate to 750 units/hr, no further titrations expected and increase warfarin dose tonight.    Goal of Therapy:  Heparin level <0.3 INR 2-2.5 Monitor platelets by anticoagulation protocol: Yes   Plan:  IV heparin 750 units/hr. Check heparin level daily to ensure not > 0.3 and CBC. Warfarin 5 mg tonight  4/15 PharmD., BCPS Clinical Pharmacist 02/02/2020 7:15 AM

## 2020-02-02 NOTE — Progress Notes (Signed)
LVAD Coordinator Rounding Note:  Admitted 01/25/20 per Dr. Shirlee Latch with new onset sternal abscess.   HM III LVAD implanted on 08/03/18 by Dr. Dr. Laneta Simmers under Destination Therapy criteria due to smoking status.   Pt sitting in chair at the sink giving himself a bath. States that he had some pain over night but overall did well. Complaining of 7/10 pain currently, but plans to take pain medication after he finishes bathing and getting dressed. No alarms from wound vac. Both sites are CDI-minimal drainage. WBC are trending down.   Vital signs: Temp: 97.8 HR: 61 Doppler Pressure: 110 Automatic BP: 121/97 (106) O2 Sat: 100% RA Wt: 187.4>174.8>178>178.7>184.3>179>181.4  lbs   LVAD interrogation reveals:  Speed: 5700 Flow: 4.3 Power:  4.4w PI: 4.5 Alarms: none  Events: 4 PI today; 16 yesterday Hematocrit: 29  Fixed speed:  5700 Low speed limit: 5400  Drive Line/sternal wound: CDI. Wound vac functioning well, no alarms. Minimal drainage.   Labs:  LDH trend: 163>193>159>134>143>136  INR trend: 3.2>2.2>2.7>1.1  Anticoagulation Plan: -INR Goal: 2.0 - 2.5 -ASA Dose: 81 mg daily -Heparin 750 u/hr-restarted 3/16 -Coumadin restarted 3/15  Device: N/A  Infection:  - 01/25/20 BCs>>negative - 01/25/20 sternal wound culture>>MSSA - 01/26/20 intra-op sternal wound cultures>>MSSA  Plan/Recommendations:  1. Call VAD Coordinator if any VAD or drive line issues.  Alyce Pagan RN VAD Coordinator  Office: [phone]  24/7 Pager: [phone]

## 2020-02-02 NOTE — Progress Notes (Signed)
Pt placed on PPV.   

## 2020-02-02 NOTE — Progress Notes (Signed)
Patient places self on CPAP.  RT assistance not needed at this time. 

## 2020-02-02 NOTE — Progress Notes (Addendum)
Patient ID: Theodore Welch, male   DOB: 24-Feb-1961, 59 y.o.   MRN: [id number] P    Advanced Heart Failure Rounding Note  PCP-Cardiologist: No primary care provider on file.   Subjective:    To OR 3/10, lower substernal abscess I&D, did not visualize LVAD hardware but able to palpate outflow graft.  Driveline site also with I&D.  Wound vacs placed lower sternal site and driveline site, now removed due to recurrent occlusion and wounds dressed. Back to OR 3/15 for repeat I&D, 2 wound vacs again placed.   WBC trending down 21>16>10.9>10>8>7>14.3 >9   ID following.  Now on cefazolin. Wound culture with MSSA, blood cultures negative so far.   CT abdomen: subxiphoid fluid collection with superficial and deep components, collection is along the course of the driveline. Cellulitis at surface, no evidence for sternal osteomyelitis.   Denies SOB.   LVAD: Heartmate 3. Flow 4.3 L/min, 5700 rpm, PI 4.8 power 4   LDH stable at 136.   Objective:   Weight Range: 82.3 kg Body mass index is 25.31 kg/m.   Vital Signs:   Temp:  [97.8 F (36.6 C)-98.7 F (37.1 C)] 98.7 F (37.1 C) (03/17 1200) Pulse Rate:  [72-88] 72 (03/16 1936) Resp:  [17-30] 19 (03/17 1200) BP: (94-121)/(75-97) 117/97 (03/17 1200) SpO2:  [97 %-100 %] 97 % (03/17 1200) Weight:  [82.3 kg] 82.3 kg (03/17 0500) Last BM Date: 02/01/20  Weight change: Filed Weights   01/31/20 0500 02/01/20 0500 02/02/20 0500  Weight: 83.6 kg 81.2 kg 82.3 kg    Intake/Output:   Intake/Output Summary (Last 24 hours) at 02/02/2020 1306 Last data filed at 02/02/2020 1007 Gross per 24 hour  Intake 1154 ml  Output 2600 ml  Net -1446 ml      Physical Exam     Physical Exam: GENERAL: No acute distress. HEENT: normal  NECK: Supple, JVP  5-6 .  2+ bilaterally, no bruits.  No lymphadenopathy or thyromegaly appreciated.  CARDIAC:  Mechanical heart sounds with LVAD hum present. VAC dressing.  LUNGS:  Clear to auscultation bilaterally.    ABDOMEN:  Soft, round, nontender, positive bowel sounds x4.     LVAD exit site: VAC dressing.  EXTREMITIES:  Warm and dry, no cyanosis, clubbing, rash or edema  NEUROLOGIC:  Alert and oriented x 4.  Gait steady.  No aphasia.  No dysarthria.  Affect pleasant.     Telemetry   SR 70-80s   Labs    CBC Recent Labs    02/01/20 0530 02/02/20 0538  WBC 14.5* 9.4  HGB 9.4* 8.9*  HCT 29.0* 28.0*  MCV 93.5 94.9  PLT 434* 413*   Basic Metabolic Panel Recent Labs    46/56/81 0530 02/02/20 0538  NA 140 143  K 4.5 3.9  CL 104 107  CO2 26 27  GLUCOSE 101* 95  BUN 6 5*  CREATININE 0.82 0.95  CALCIUM 9.1 8.9   Liver Function Tests No results for input(s): AST, ALT, ALKPHOS, BILITOT, PROT, ALBUMIN in the last 72 hours. No results for input(s): LIPASE, AMYLASE in the last 72 hours. Cardiac Enzymes No results for input(s): CKTOTAL, CKMB, CKMBINDEX, TROPONINI in the last 72 hours.  BNP: BNP (last 3 results) No results for input(s): BNP in the last 8760 hours.  ProBNP (last 3 results) No results for input(s): PROBNP in the last 8760 hours.   D-Dimer No results for input(s): DDIMER in the last 72 hours. Hemoglobin A1C No results for input(s): HGBA1C in  the last 72 hours. Fasting Lipid Panel No results for input(s): CHOL, HDL, LDLCALC, TRIG, CHOLHDL, LDLDIRECT in the last 72 hours. Thyroid Function Tests No results for input(s): TSH, T4TOTAL, T3FREE, THYROIDAB in the last 72 hours.  Invalid input(s): FREET3  Other results:   Imaging    [name] EKG SITE RITE  Result Date: 02/02/2020 If Site Rite image not attached, placement could not be confirmed due to current cardiac rhythm.    Medications:     Scheduled Medications: . sodium chloride   Intravenous Once  . acetaminophen  1,000 mg Oral Q6H   Or  . acetaminophen (TYLENOL) oral liquid 160 mg/5 mL  1,000 mg Oral Q6H  . amLODipine  5 mg Oral Daily  . aspirin EC  81 mg Oral Daily  . atorvastatin  20 mg Oral Daily   . bisacodyl  10 mg Oral Daily  . DULoxetine  60 mg Oral Daily  . fluticasone  1 spray Each Nare Daily  . insulin aspart  0-15 Units Subcutaneous TID WC  . insulin aspart  0-5 Units Subcutaneous QHS  . pantoprazole  40 mg Oral Daily  . sacubitril-valsartan  1 tablet Oral BID  . senna-docusate  1 tablet Oral QHS  . sodium chloride flush  10-40 mL Intracatheter Q12H  . sodium chloride flush  3 mL Intravenous Q12H  . spironolactone  25 mg Oral Daily  . thiamine  100 mg Oral Daily  . vitamin B-12  1,000 mcg Oral Daily  . warfarin  5 mg Oral q1800  . Warfarin - Physician Dosing Inpatient   Does not apply q1800  . zinc sulfate  220 mg Oral Daily    Infusions: . sodium chloride    . sodium chloride Stopped (01/31/20 1915)  .  ceFAZolin (ANCEF) IV 2 g (02/02/20 0644)  . heparin 750 Units/hr (02/02/20 0837)    PRN Medications: sodium chloride, sodium chloride, acetaminophen, docusate sodium, fentaNYL (SUBLIMAZE) injection, hydrALAZINE, hydrOXYzine, ipratropium-albuterol, methocarbamol, ondansetron (ZOFRAN) IV, ondansetron (ZOFRAN) IV, ondansetron (ZOFRAN) IV, oxyCODONE, polyethylene glycol, sodium chloride flush, sodium chloride flush, traMADol, zolpidem   Assessment/Plan   1. Subxiphoid abscess: With surrounding cellulitis.  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  CT abdomen showed subxiphoid fluid collection with superficial and deep components, collection is along the course of the driveline. Cellulitis at surface, no evidence for sternal osteomyelitis. S/P I&D 3/10, did not visualize LVAD hardware but able to palpate outflow graft.  Driveline site also with I&D.  Wound vacs placed to lower sternal site and driveline site but now removed with recurrent occlusions.  Now afebrile with WBCs down. MSSA from wound culture. Echo this admission did not show evidence for vegetations but technically difficult.  - S/P  I&D 3/15, 2 wound vacs  placed.  - ID appreciated. Recommendations for home antibiotics. Place PICC  - Continue Cefazolin x 8 wks. PICC today. Plan to continue antibiotics for total of 8 weeks. Very concerning infection, especially given prior MSSA driveline infection and already treating with suppressive Keflex. There is a possibility that this could lead to the need for pump exchange, at the least he will need extended IV treatment.  2. 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  - Volume status stable.  - Continue spironolactone 25 mg daily.  - Increase entresto to 97-103 twice a day (home dose). Maps in low 100s.     - Amlodipine started back at  5 mg daily.   - Continue ASA 81 daily. - Warfarin with goal INR 2-2.5. On coumadin INR 1.1. Continue heparin until heparin 1.8.  - Should be transplant candidate eventually if he can quit smoking.  Will make transplant clinic referral when he is off totally.  3. Smoking: Cutting back.   4. H/o RLE DVT: Has been on warfarin for LVAD.  5. OSA: Continue CPAP.  6. Hyperlipidemia: Atorvastatin.  7. Type II diabetes: SSI while inpatient.    Place PICC today. HH ordered for home antibiotics per ID recommendations.  TOC consulted for home VAC .   Plan d/c once INR therapeutic.   Length of Stay: [place], NP  02/02/2020, 1:06 PM  Advanced Heart Failure Team Pager [phone] (M-F; 7a - 4p)  Please contact [facility] for night-coverage after hours (4p -7a ) and weekends on [email or web address]  Patient seen with NP, agree with the above note.   Stable today, INR still low 1.1 and remains on heparin gtt.   MAP elevated, restarted amlodipine.   Afebrile, WBCs normal.  Continue cefazolin.   PICC today.  Home when INR up to 1.8. Will need 8 wks total cefazolin.   Loralie Champagne 02/02/2020 1:40 PM

## 2020-02-03 LAB — GLUCOSE, CAPILLARY
Glucose-Capillary: 95 mg/dL (ref 70–99)
Glucose-Capillary: 71 mg/dL (ref 70–99)
Glucose-Capillary: 125 mg/dL — ABNORMAL HIGH (ref 70–99)
Glucose-Capillary: 140 mg/dL — ABNORMAL HIGH (ref 70–99)

## 2020-02-03 LAB — PROTIME-INR
INR: 1.1 (ref 0.8–1.2)
Prothrombin Time: 14.4 seconds (ref 11.4–15.2)

## 2020-02-03 LAB — HEPARIN LEVEL (UNFRACTIONATED): Heparin Unfractionated: <0.1 IU/mL — ABNORMAL LOW (ref 0.30–0.70)

## 2020-02-03 LAB — LACTATE DEHYDROGENASE: LDH: 143 U/L (ref 98–192)

## 2020-02-03 LAB — CBC
HCT: 27.6 % — ABNORMAL LOW (ref 39.0–52.0)
Hemoglobin: 8.8 g/dL — ABNORMAL LOW (ref 13.0–17.0)
MCH: 30.6 pg (ref 26.0–34.0)
MCHC: 31.9 g/dL (ref 30.0–36.0)
MCV: 95.8 fL (ref 80.0–100.0)
Platelets: 438 10*3/uL — ABNORMAL HIGH (ref 150–400)
RBC: 2.88 MIL/uL — ABNORMAL LOW (ref 4.22–5.81)
RDW: 13 % (ref 11.5–15.5)
WBC: 10.2 10*3/uL (ref 4.0–10.5)
nRBC: 0 % (ref 0.0–0.2)

## 2020-02-03 LAB — BASIC METABOLIC PANEL
Anion gap: 9 (ref 5–15)
BUN: 7 mg/dL (ref 6–20)
CO2: 27 mmol/L (ref 22–32)
Calcium: 8.7 mg/dL — ABNORMAL LOW (ref 8.9–10.3)
Chloride: 106 mmol/L (ref 98–111)
Creatinine, Ser: 0.82 mg/dL (ref 0.61–1.24)
GFR calc Af Amer: >60 mL/min (ref >60)
GFR calc non Af Amer: >60 mL/min (ref >60)
Glucose, Bld: 97 mg/dL (ref 70–99)
Potassium: 4 mmol/L (ref 3.5–5.1)
Sodium: 142 mmol/L (ref 135–145)

## 2020-02-03 MED ORDER — WARFARIN SODIUM 7.5 MG PO TABS
7.5000 mg | ORAL_TABLET | Freq: Once | ORAL | Status: AC
  Administered 2020-02-03: 7.5 mg via ORAL
  Filled 2020-02-03: qty 1

## 2020-02-03 MED ORDER — SODIUM CHLORIDE 0.9% FLUSH: 10.0000 mL | INTRAVENOUS | Status: DC | PRN

## 2020-02-03 MED ORDER — AMLODIPINE BESYLATE 10 MG PO TABS
10.0000 mg | ORAL_TABLET | Freq: Every day | ORAL | Status: DC
  Administered 2020-02-03 – 2020-02-09 (×7): 10 mg via ORAL
  Filled 2020-02-03 (×7): qty 1

## 2020-02-03 NOTE — Progress Notes (Signed)
[place] RN aware of order to d/c CVC

## 2020-02-03 NOTE — Progress Notes (Signed)
LVAD Coordinator Rounding Note:  Admitted 01/25/20 per Dr. Shirlee Latch with new onset sternal abscess.   HM III LVAD implanted on 08/03/18 by Dr. Dr. Laneta Simmers under Destination Therapy criteria due to smoking status.   Pt sitting up in bed. PICC line placed this morning. Pt denies pain currently. No alarms from wound vac. Both sites are CDI-minimal drainage. WBC are trending down. PICC placed this morning.   Vital signs: Temp: 98.1 HR: 75 Doppler Pressure: 102 Automatic BP: 111/89 (98) O2 Sat: 100% RA Wt: 187.4>174.8>178>178.7>184.3>179>181.4>185.4  lbs   LVAD interrogation reveals:  Speed: 5700 Flow: 4.4 Power:  4.5w PI: 4.5 Alarms: none  Events: 2 PI today Hematocrit: 28  Fixed speed:  5700 Low speed limit: 5400  Drive Line/sternal wound: CDI. Wound vac functioning well, no alarms. Minimal drainage.   Labs:  LDH trend: 163>193>159>134>143>136>143  INR trend: 3.2>2.2>2.7>1.1  Anticoagulation Plan: -INR Goal: 2.0 - 2.5 -ASA Dose: 81 mg daily -Heparin 850 u/hr-restarted 3/16 -Coumadin restarted 3/15  Device: N/A  Infection:  - 01/25/20 BCs>>negative - 01/25/20 sternal wound culture>>MSSA - 01/26/20 intra-op sternal wound cultures>>MSSA  Plan/Recommendations:  1. Call VAD Coordinator if any VAD or drive line issues.  Alyce Pagan RN VAD Coordinator  Office: [phone]  24/7 Pager: [phone]

## 2020-02-03 NOTE — Progress Notes (Signed)
Patient ID: Theodore Welch, male   DOB: Dec 12, 1960, 59 y.o.   MRN: [id number] P    Advanced Heart Failure Rounding Note  PCP-Cardiologist: No primary care provider on file.   Subjective:    To OR 3/10, lower substernal abscess I&D, did not visualize LVAD hardware but able to palpate outflow graft.  Driveline site also with I&D.  Wound vacs placed lower sternal site and driveline site, now removed due to recurrent occlusion and wounds dressed. Back to OR 3/15 for repeat I&D, 2 wound vacs again placed.  He has noted an air leak at the lower wound vac that started this morning.   WBCs stable at 10.2.   ID has seen.  Now on cefazolin. Wound culture with MSSA, blood cultures negative so far.  PICC placed this morning for home abx.   CT abdomen: subxiphoid fluid collection with superficial and deep components, collection is along the course of the driveline. Cellulitis at surface, no evidence for sternal osteomyelitis.   MAP running 80s-90s.   LVAD: Heartmate 3. Flow 4.4 L/min, 5700 rpm, PI 3.2 power 4.4   LDH stable at 143.   Objective:   Weight Range: 84.1 kg Body mass index is 25.86 kg/m.   Vital Signs:   Temp:  [97.9 F (36.6 C)-98.7 F (37.1 C)] 98.1 F (36.7 C) (03/18 0809) Pulse Rate:  [64-75] 65 (03/18 0809) Resp:  [14-24] 19 (03/18 0809) BP: (98-117)/(83-97) 117/86 (03/18 0809) SpO2:  [97 %-100 %] 98 % (03/18 0316) Weight:  [84.1 kg] 84.1 kg (03/18 0323) Last BM Date: 02/01/20  Weight change: Filed Weights   02/01/20 0500 02/02/20 0500 02/03/20 0323  Weight: 81.2 kg 82.3 kg 84.1 kg    Intake/Output:   Intake/Output Summary (Last 24 hours) at 02/03/2020 0924 Last data filed at 02/03/2020 0631 Gross per 24 hour  Intake 1821.08 ml  Output 1250 ml  Net 571.08 ml      Physical Exam    General: Well appearing this am. NAD.  HEENT: Normal. Neck: Supple, JVP 7-8 cm. Carotids OK.  Cardiac:  Mechanical heart sounds with LVAD hum present.  Lungs:  CTAB, normal  effort.  Abdomen:  NT, ND, no HSM. No bruits or masses. +BS  LVAD exit site: Wound vac over driveline site.  Extremities:  Warm and dry. No cyanosis, clubbing, rash, or edema.  Neuro:  Alert & oriented x 3. Cranial nerves grossly intact. Moves all 4 extremities w/o difficulty. Affect pleasant     Telemetry   SR 70-80s   Labs    CBC Recent Labs    02/02/20 0538 02/03/20 0330  WBC 9.4 10.2  HGB 8.9* 8.8*  HCT 28.0* 27.6*  MCV 94.9 95.8  PLT 413* 438*   Basic Metabolic Panel Recent Labs    13/08/65 0538 02/03/20 0330  NA 143 142  K 3.9 4.0  CL 107 106  CO2 27 27  GLUCOSE 95 97  BUN 5* 7  CREATININE 0.95 0.82  CALCIUM 8.9 8.7*   Liver Function Tests No results for input(s): AST, ALT, ALKPHOS, BILITOT, PROT, ALBUMIN in the last 72 hours. No results for input(s): LIPASE, AMYLASE in the last 72 hours. Cardiac Enzymes No results for input(s): CKTOTAL, CKMB, CKMBINDEX, TROPONINI in the last 72 hours.  BNP: BNP (last 3 results) No results for input(s): BNP in the last 8760 hours.  ProBNP (last 3 results) No results for input(s): PROBNP in the last 8760 hours.   D-Dimer No results for input(s): DDIMER in  the last 72 hours. Hemoglobin A1C No results for input(s): HGBA1C in the last 72 hours. Fasting Lipid Panel No results for input(s): CHOL, HDL, LDLCALC, TRIG, CHOLHDL, LDLDIRECT in the last 72 hours. Thyroid Function Tests No results for input(s): TSH, T4TOTAL, T3FREE, THYROIDAB in the last 72 hours.  Invalid input(s): FREET3  Other results:   Imaging    No results found.   Medications:     Scheduled Medications: . sodium chloride   Intravenous Once  . acetaminophen  1,000 mg Oral Q6H   Or  . acetaminophen (TYLENOL) oral liquid 160 mg/5 mL  1,000 mg Oral Q6H  . amLODipine  10 mg Oral Daily  . aspirin EC  81 mg Oral Daily  . atorvastatin  20 mg Oral Daily  . bisacodyl  10 mg Oral Daily  . DULoxetine  60 mg Oral Daily  . fluticasone  1 spray  Each Nare Daily  . insulin aspart  0-15 Units Subcutaneous TID WC  . insulin aspart  0-5 Units Subcutaneous QHS  . pantoprazole  40 mg Oral Daily  . sacubitril-valsartan  1 tablet Oral BID  . senna-docusate  1 tablet Oral QHS  . sodium chloride flush  10-40 mL Intracatheter Q12H  . sodium chloride flush  3 mL Intravenous Q12H  . spironolactone  25 mg Oral Daily  . thiamine  100 mg Oral Daily  . vitamin B-12  1,000 mcg Oral Daily  . warfarin  7.5 mg Oral ONCE-1800  . Warfarin - Physician Dosing Inpatient   Does not apply q1800  . zinc sulfate  220 mg Oral Daily    Infusions: . sodium chloride    . sodium chloride 10 mL/hr at 02/03/20 0630  .  ceFAZolin (ANCEF) IV 2 g (02/03/20 0631)  . heparin 750 Units/hr (02/02/20 2225)    PRN Medications: sodium chloride, sodium chloride, acetaminophen, docusate sodium, fentaNYL (SUBLIMAZE) injection, hydrALAZINE, hydrOXYzine, ipratropium-albuterol, methocarbamol, ondansetron (ZOFRAN) IV, ondansetron (ZOFRAN) IV, ondansetron (ZOFRAN) IV, oxyCODONE, polyethylene glycol, sodium chloride flush, sodium chloride flush, sodium chloride flush, traMADol, zolpidem   Assessment/Plan   1. Subxiphoid abscess: With surrounding cellulitis.  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  CT abdomen showed subxiphoid fluid collection with superficial and deep components, collection is along the course of the driveline. Cellulitis at surface, no evidence for sternal osteomyelitis. S/P I&D 3/10, did not visualize LVAD hardware but able to palpate outflow graft.  Driveline site also with I&D.  Wound vacs placed to lower sternal site and driveline site but now removed with recurrent occlusions.  Now afebrile with WBCs down. MSSA from wound culture. Echo this admission did not show evidence for vegetations but technically difficult. S/P repeat I&D 3/15, 2 wound vacs placed. Lower wound vac has an air leak that developed  this morning.  - Wound vac air leaked fixed this am.  - Continue Cefazolin x 8 wks. PICC placed.  - Very concerning infection, especially given prior MSSA driveline infection and already treating with suppressive Keflex. There is a possibility that this could lead to the need for pump exchange, at the least he will need extended IV treatment.  2. 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  Volume status stable. MAP 80s-90s.  - Continue spironolactone 25 mg daily.  - Continue 97-103 twice a day (home dose). Maps in low 100s.     - Increase amlodipine to 10 mg daily.  - Continue  ASA 81 daily. - Warfarin with goal INR 2-2.5. On coumadin, INR 1.1. Adjust dose today. Continue heparin until heparin 1.8.  - Should be transplant candidate eventually if he can quit smoking.  Will make transplant clinic referral when he is off totally.  3. Smoking: Cutting back.   4. H/o RLE DVT: Has been on warfarin for LVAD.  5. OSA: Continue CPAP.  6. Hyperlipidemia: Atorvastatin.  7. Type II diabetes: SSI while inpatient.    Placed PICC today. HH ordered for home antibiotics per ID recommendations.  TOC consulted for home VAC .   Plan d/c once INR therapeutic.   Length of Stay: 9  Marca Ancona, MD  02/03/2020, 9:24 AM  Advanced Heart Failure Team Pager [phone] (M-F; 7a - 4p)  Please contact [facility] for night-coverage after hours (4p -7a ) and weekends on [email or web address]

## 2020-02-03 NOTE — Progress Notes (Addendum)
ANTICOAGULATION CONSULT NOTE  Pharmacy Consult for Heparin to Coumadin Indication: LVAD  Allergies  Allergen Reactions  . Chlorhexidine Rash  . Other Rash    Prep pads    Patient Measurements: Height: 5\' 11"  (180.3 cm) Weight: 185 lb 6.5 oz (84.1 kg)(scale A) IBW/kg (Calculated) : 75.3 Heparin Dosing Weight: 85 kg  Vital Signs: Temp: 97.9 F (36.6 C) (03/18 0316) Temp Source: Oral (03/18 0316) BP: 98/83 (03/18 0316) Pulse Rate: 71 (03/18 0316)  Labs: Recent Labs    02/01/20 0530 02/01/20 0530 02/01/20 0549 02/01/20 1735 02/02/20 0538 02/02/20 0539 02/03/20 0330 02/03/20 0331  HGB 9.4*   < >  --   --  8.9*  --  8.8*  --   HCT 29.0*  --   --   --  28.0*  --  27.6*  --   PLT 434*  --   --   --  413*  --  438*  --   LABPROT  --   --  14.3  --  14.3  --  14.4  --   INR  --   --  1.1  --  1.1  --  1.1  --   HEPARINUNFRC  --   --   --  <0.10*  --  <0.10*  --  <0.10*  CREATININE 0.82  --   --   --  0.95  --  0.82  --    < > = values in this interval not displayed.    Estimated Creatinine Clearance: 102 mL/min (by C-G formula based on SCr of 0.82 mg/dL).   Medical History: Past Medical History:  Diagnosis Date  . 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 Cardiomyopathy, unspecified (HCC)   . CHF (congestive heart failure) (HCC)   . Chronic back pain   . Diabetes mellitus without complication (HCC)   . Enlarged heart   . Gastric ulcer   . Gastroenteritis   . H/O degenerative disc disease   . Hypertension   . LVAD (left ventricular assist device) present [facility])     Assessment: 58yom presenting with concern of large sternal abscess- starting on antibiotics. On warfarin PTA for hx LVAD (goal 2-2.5).  PTA regimen is warfarin 5 mg daily except 7.5 mg TTS.   S/p I&D/driveline exploration yesterday in OR 3/15. No bleeding complications noted. Hgb stable 8.9. LDH stable 413.   Heparin level undetectable as expected on low dose heparin, increased yesterday. INR  unchanged at 1.1. CBC and LDH stable.   Goal of Therapy:  Heparin level <0.3 INR 2-2.5 Monitor platelets by anticoagulation protocol: Yes   Plan:  IV heparin increased to 850 units/hr per surgery Check heparin level daily to ensure not > 0.3 and CBC. Warfarin 7.5 mg tonight  4/15 PharmD., BCPS Clinical Pharmacist 02/03/2020 7:43 AM

## 2020-02-03 NOTE — Progress Notes (Signed)
Peripherally Inserted Central Catheter Placement  The IV Nurse has discussed with the patient and/or persons authorized to consent for the patient, the purpose of this procedure and the potential benefits and risks involved with this procedure.  The benefits include less needle sticks, lab draws from the catheter, and the patient may be discharged home with the catheter. Risks include, but not limited to, infection, bleeding, blood clot (thrombus formation), and puncture of an artery; nerve damage and irregular heartbeat and possibility to perform a PICC exchange if needed/ordered by physician.  Alternatives to this procedure were also discussed.  Bard Power PICC patient education guide, fact sheet on infection prevention and patient information card has been provided to patient /or left at bedside.    PICC/Midline Placement Documentation  PICC Single Lumen 02/03/20 PICC Right Basilic 38 cm 0 cm (Active)  Indication for Insertion or Continuance of Line Home intravenous therapies (PICC only) 02/03/20 0902  Exposed Catheter (cm) 0 cm 02/03/20 0902  Site Assessment Clean;Dry;Intact 02/03/20 0902  Line Status Flushed;Saline locked;Blood return noted 02/03/20 0902  Dressing Type Transparent;Securing device 02/03/20 0902  Dressing Status Clean;Dry;Intact 02/03/20 0902  Dressing Intervention New dressing 02/03/20 0902  Dressing Change Due 02/10/20 02/03/20 0902    Povidone-Iodine swabsticks used to clean and prep the PICC site since patient is allergic to chloroprep swabs.   Theodore Welch 02/03/2020, 9:05 AM

## 2020-02-03 NOTE — Progress Notes (Addendum)
3 Days Post-Op Procedure(s) (LRB): STERNAL WOUND IRRIGATION (N/A) WOUND VAC CHANGE (N/A) Subjective: Surgical site pain has improved Minimal serosanguineous drainage from wound VAC sites Heparin now at 850 units/h, Coumadin being resumed PICC line is in place for home IV antibiotics Plan home care with home health nurse for wound VAC assistance once INR greater than 1.7  Objective: Vital signs in last 24 hours: Temp:  [97.9 F (36.6 C)-98.2 F (36.8 C)] 98.2 F (36.8 C) (03/18 1137) Pulse Rate:  [62-75] 62 (03/18 1137) Cardiac Rhythm: Normal sinus rhythm (03/18 1426) Resp:  [13-21] 13 (03/18 1137) BP: (98-126)/(83-97) 126/93 (03/18 1137) SpO2:  [98 %-100 %] 98 % (03/18 0316) Weight:  [84.1 kg] 84.1 kg (03/18 0323)  H stable emodynamic parameters for last 24 hours:    Intake/Output from previous day: 03/17 0701 - 03/18 0700 In: 2712.6 [P.O.:1850; I.V.:269.1; IV Piggyback:593.5] Out: 1750 [Urine:1650; Drains:100] Intake/Output this shift: Total I/O In: 250 [P.O.:250] Out: 410 [Urine:400; Drains:10]  Alert and comfortable Lungs clear Normal VAD hum Wound VAC sites secure  Lab Results: Recent Labs    02/02/20 0538 02/03/20 0330  WBC 9.4 10.2  HGB 8.9* 8.8*  HCT 28.0* 27.6*  PLT 413* 438*   BMET:  Recent Labs    02/02/20 0538 02/03/20 0330  NA 143 142  K 3.9 4.0  CL 107 106  CO2 27 27  GLUCOSE 95 97  BUN 5* 7  CREATININE 0.95 0.82  CALCIUM 8.9 8.7*    PT/INR:  Recent Labs    02/03/20 0330  LABPROT 14.4  INR 1.1   ABG    Component Value Date/Time   PHART 7.625 (HH) 01/25/2020 2108   HCO3 23.6 01/25/2020 2108   TCO2 26 08/04/2018 1622   O2SAT 97.9 01/25/2020 2108   CBG (last 3)  Recent Labs    02/02/20 2124 02/03/20 0629 02/03/20 1135  GLUCAP 113* 95 71    Assessment/Plan: S/P Procedure(s) (LRB): STERNAL WOUND IRRIGATION (N/A) WOUND VAC CHANGE (N/A) Continue care with plans to transition to outpatient wound care for MSSA wound.   LOS: 9 days    Theodore Welch 02/03/2020

## 2020-02-04 ENCOUNTER — Encounter (HOSPITAL_COMMUNITY): Payer: Self-pay | Admitting: Cardiology

## 2020-02-04 ENCOUNTER — Inpatient Hospital Stay (HOSPITAL_COMMUNITY): Payer: No Typology Code available for payment source | Admitting: Certified Registered Nurse Anesthetist

## 2020-02-04 ENCOUNTER — Encounter (HOSPITAL_COMMUNITY)
Admission: AD | Disposition: A | Discharge status: Home Health | Payer: Self-pay | Source: Ambulatory Visit | Attending: Cardiology

## 2020-02-04 DIAGNOSIS — T8141XA Infection following a procedure, superficial incisional surgical site, initial encounter: Secondary | ICD-10-CM

## 2020-02-04 HISTORY — PX: STERNAL WOUND DEBRIDEMENT: SHX1058

## 2020-02-04 LAB — CBC
HCT: 28.2 % — ABNORMAL LOW (ref 39.0–52.0)
Hemoglobin: 9.1 g/dL — ABNORMAL LOW (ref 13.0–17.0)
MCH: 30.8 pg (ref 26.0–34.0)
MCHC: 32.3 g/dL (ref 30.0–36.0)
MCV: 95.6 fL (ref 80.0–100.0)
Platelets: 438 10*3/uL — ABNORMAL HIGH (ref 150–400)
RBC: 2.95 MIL/uL — ABNORMAL LOW (ref 4.22–5.81)
RDW: 13.4 % (ref 11.5–15.5)
WBC: 8.9 10*3/uL (ref 4.0–10.5)
nRBC: 0 % (ref 0.0–0.2)

## 2020-02-04 LAB — BASIC METABOLIC PANEL
Anion gap: 11 (ref 5–15)
BUN: 6 mg/dL (ref 6–20)
CO2: 26 mmol/L (ref 22–32)
Calcium: 9.2 mg/dL (ref 8.9–10.3)
Chloride: 106 mmol/L (ref 98–111)
Creatinine, Ser: 0.85 mg/dL (ref 0.61–1.24)
GFR calc Af Amer: >60 mL/min (ref >60)
GFR calc non Af Amer: >60 mL/min (ref >60)
Glucose, Bld: 98 mg/dL (ref 70–99)
Potassium: 3.8 mmol/L (ref 3.5–5.1)
Sodium: 143 mmol/L (ref 135–145)

## 2020-02-04 LAB — BPAM RBC
Blood Product Expiration Date: 202104132359
Unit Type and Rh: 5100

## 2020-02-04 LAB — HEPARIN LEVEL (UNFRACTIONATED): Heparin Unfractionated: <0.1 IU/mL — ABNORMAL LOW (ref 0.30–0.70)

## 2020-02-04 LAB — GLUCOSE, CAPILLARY
Glucose-Capillary: 101 mg/dL — ABNORMAL HIGH (ref 70–99)
Glucose-Capillary: 153 mg/dL — ABNORMAL HIGH (ref 70–99)
Glucose-Capillary: 95 mg/dL (ref 70–99)
Glucose-Capillary: 93 mg/dL (ref 70–99)
Glucose-Capillary: 85 mg/dL (ref 70–99)
Glucose-Capillary: 242 mg/dL — ABNORMAL HIGH (ref 70–99)

## 2020-02-04 LAB — TYPE AND SCREEN
ABO/RH(D): O POS
Antibody Screen: NEGATIVE
Unit division: 0

## 2020-02-04 LAB — LACTATE DEHYDROGENASE: LDH: 153 U/L (ref 98–192)

## 2020-02-04 LAB — PROTIME-INR
INR: 1.1 (ref 0.8–1.2)
Prothrombin Time: 14.4 seconds (ref 11.4–15.2)

## 2020-02-04 SURGERY — DEBRIDEMENT, WOUND, STERNUM
Anesthesia: Monitor Anesthesia Care | Site: Chest

## 2020-02-04 MED ORDER — NICOTINE 14 MG/24HR TD PT24
14.0000 mg | MEDICATED_PATCH | Freq: Every day | TRANSDERMAL | Status: DC
  Administered 2020-02-04 – 2020-02-09 (×6): 14 mg via TRANSDERMAL
  Filled 2020-02-04 (×6): qty 1

## 2020-02-04 MED ORDER — MIDAZOLAM HCL 2 MG/2ML IJ SOLN
INTRAMUSCULAR | Status: AC
  Filled 2020-02-04: qty 2

## 2020-02-04 MED ORDER — VANCOMYCIN HCL 1000 MG IV SOLR
INTRAVENOUS | Status: DC | PRN
  Administered 2020-02-04: 1000 mg

## 2020-02-04 MED ORDER — PROPOFOL 10 MG/ML IV BOLUS
INTRAVENOUS | Status: DC | PRN
  Administered 2020-02-04: 30 mg via INTRAVENOUS

## 2020-02-04 MED ORDER — MIDAZOLAM HCL 5 MG/5ML IJ SOLN
INTRAMUSCULAR | Status: DC | PRN
  Administered 2020-02-04: 2 mg via INTRAVENOUS

## 2020-02-04 MED ORDER — WARFARIN SODIUM 7.5 MG PO TABS
7.5000 mg | ORAL_TABLET | Freq: Once | ORAL | Status: DC
  Filled 2020-02-04: qty 1

## 2020-02-04 MED ORDER — FENTANYL CITRATE (PF) 250 MCG/5ML IJ SOLN
INTRAMUSCULAR | Status: AC
  Filled 2020-02-04: qty 5

## 2020-02-04 MED ORDER — PROPOFOL 500 MG/50ML IV EMUL
INTRAVENOUS | Status: DC | PRN
  Administered 2020-02-04: 75 ug/kg/min via INTRAVENOUS

## 2020-02-04 MED ORDER — VANCOMYCIN HCL 1000 MG IV SOLR
INTRAVENOUS | Status: AC
  Filled 2020-02-04: qty 1000

## 2020-02-04 MED ORDER — SODIUM CHLORIDE 0.9 % IR SOLN
Status: DC | PRN
  Administered 2020-02-04: 1000 mL

## 2020-02-04 MED ORDER — LACTATED RINGERS IV SOLN: INTRAVENOUS | Status: DC

## 2020-02-04 MED ORDER — WARFARIN SODIUM 7.5 MG PO TABS
7.5000 mg | ORAL_TABLET | Freq: Once | ORAL | Status: AC
  Administered 2020-02-04: 19:00:00 7.5 mg via ORAL

## 2020-02-04 MED ORDER — LIDOCAINE HCL (PF) 1 % IJ SOLN
INTRAMUSCULAR | Status: AC
  Filled 2020-02-04: qty 30

## 2020-02-04 MED ORDER — PROPOFOL 10 MG/ML IV BOLUS
INTRAVENOUS | Status: AC
  Filled 2020-02-04: qty 20

## 2020-02-04 MED ORDER — ISOSORB DINITRATE-HYDRALAZINE 20-37.5 MG PO TABS
0.5000 | ORAL_TABLET | Freq: Three times a day (TID) | ORAL | Status: DC
  Administered 2020-02-04 – 2020-02-05 (×3): 0.5 via ORAL
  Filled 2020-02-04 (×3): qty 1

## 2020-02-04 MED ORDER — FENTANYL CITRATE (PF) 100 MCG/2ML IJ SOLN
INTRAMUSCULAR | Status: DC | PRN
  Administered 2020-02-04 (×3): 50 ug via INTRAVENOUS

## 2020-02-04 MED ORDER — LIDOCAINE HCL 1 % IJ SOLN
INTRAMUSCULAR | Status: DC | PRN
  Administered 2020-02-04: 5 mL via INTRADERMAL

## 2020-02-04 MED ORDER — FENTANYL CITRATE (PF) 100 MCG/2ML IJ SOLN: 25.0000 ug | INTRAMUSCULAR | Status: DC | PRN

## 2020-02-04 SURGICAL SUPPLY — 65 items
ATTRACTOMAT 16X20 MAGNETIC DRP (DRAPES) ×1 IMPLANT
BAG DECANTER FOR FLEXI CONT (MISCELLANEOUS) ×1 IMPLANT
BENZOIN TINCTURE PRP APPL 2/3 (GAUZE/BANDAGES/DRESSINGS) IMPLANT
BLADE CLIPPER SURG (BLADE) ×3 IMPLANT
BLADE SURG 10 STRL SS (BLADE) ×1 IMPLANT
BNDG GAUZE ELAST 4 BULKY (GAUZE/BANDAGES/DRESSINGS) IMPLANT
CANISTER SUCT 3000ML PPV (MISCELLANEOUS) ×3 IMPLANT
CANISTER WOUND CARE 500ML ATS (WOUND CARE) ×2 IMPLANT
CATH FOLEY 2WAY SLVR  5CC 16FR (CATHETERS)
CATH FOLEY 2WAY SLVR 5CC 16FR (CATHETERS) IMPLANT
CATH THORACIC 28FR RT ANG (CATHETERS) IMPLANT
CATH THORACIC 36FR (CATHETERS) IMPLANT
CLIP VESOCCLUDE MED 24/CT (CLIP) ×2 IMPLANT
CLIP VESOCCLUDE SM WIDE 24/CT (CLIP) ×2 IMPLANT
CNTNR URN SCR LID CUP LEK RST (MISCELLANEOUS) IMPLANT
CONN Y 3/8X3/8X3/8  BEN (MISCELLANEOUS)
CONN Y 3/8X3/8X3/8 BEN (MISCELLANEOUS) IMPLANT
CONNECTOR Y ATS VAC SYSTEM (MISCELLANEOUS) ×2 IMPLANT
CONT SPEC 4OZ STRL OR WHT (MISCELLANEOUS)
COVER SURGICAL LIGHT HANDLE (MISCELLANEOUS) ×4 IMPLANT
DRAPE LAPAROSCOPIC ABDOMINAL (DRAPES) ×3 IMPLANT
DRAPE WARM FLUID 44X44 (DRAPES) IMPLANT
DRSG AQUACEL AG ADV 3.5X14 (GAUZE/BANDAGES/DRESSINGS) ×1 IMPLANT
DRSG PAD ABDOMINAL 8X10 ST (GAUZE/BANDAGES/DRESSINGS) IMPLANT
DRSG VAC ATS MED SENSATRAC (GAUZE/BANDAGES/DRESSINGS) ×4 IMPLANT
ELECT REM PT RETURN 9FT ADLT (ELECTROSURGICAL) ×3
ELECTRODE REM PT RTRN 9FT ADLT (ELECTROSURGICAL) ×1 IMPLANT
GAUZE SPONGE 4X4 12PLY STRL (GAUZE/BANDAGES/DRESSINGS) ×3 IMPLANT
GAUZE XEROFORM 5X9 LF (GAUZE/BANDAGES/DRESSINGS) IMPLANT
GLOVE BIO SURGEON STRL SZ7.5 (GLOVE) ×3 IMPLANT
GOWN STRL REUS W/ TWL LRG LVL3 (GOWN DISPOSABLE) ×4 IMPLANT
GOWN STRL REUS W/TWL LRG LVL3 (GOWN DISPOSABLE) ×9
HANDPIECE INTERPULSE COAX TIP (DISPOSABLE)
HEMOSTAT POWDER SURGIFOAM 1G (HEMOSTASIS) ×2 IMPLANT
HEMOSTAT SURGICEL 2X14 (HEMOSTASIS) IMPLANT
KIT BASIN OR (CUSTOM PROCEDURE TRAY) ×5 IMPLANT
KIT SUCTION CATH 14FR (SUCTIONS) IMPLANT
KIT TURNOVER KIT B (KITS) ×3 IMPLANT
NDL HYPO 25GX1X1/2 BEV (NEEDLE) IMPLANT
NEEDLE HYPO 25GX1X1/2 BEV (NEEDLE) ×3 IMPLANT
NS IRRIG 1000ML POUR BTL (IV SOLUTION) ×3 IMPLANT
PACK CHEST (CUSTOM PROCEDURE TRAY) ×5 IMPLANT
PAD ARMBOARD 7.5X6 YLW CONV (MISCELLANEOUS) ×6 IMPLANT
SET HNDPC FAN SPRY TIP SCT (DISPOSABLE) IMPLANT
SOL PREP POV-IOD 4OZ 10% (MISCELLANEOUS) IMPLANT
SPONGE LAP 18X18 RF (DISPOSABLE) ×1 IMPLANT
STAPLER VISISTAT 35W (STAPLE) IMPLANT
SURGILUBE 2OZ TUBE FLIPTOP (MISCELLANEOUS) ×2 IMPLANT
SUT ETHILON 3 0 FSL (SUTURE) IMPLANT
SUT SILK 3 0 SH CR/8 (SUTURE) ×2 IMPLANT
SUT STEEL 6MS V (SUTURE) IMPLANT
SUT STEEL STERNAL CCS#1 18IN (SUTURE) IMPLANT
SUT STEEL SZ 6 DBL 3X14 BALL (SUTURE) IMPLANT
SUT VIC AB 1 CTX 36 (SUTURE)
SUT VIC AB 1 CTX36XBRD ANBCTR (SUTURE) ×2 IMPLANT
SUT VIC AB 2-0 CTX 27 (SUTURE) ×2 IMPLANT
SUT VIC AB 3-0 X1 27 (SUTURE) ×2 IMPLANT
SWAB COLLECTION DEVICE MRSA (MISCELLANEOUS) IMPLANT
SWAB CULTURE ESWAB REG 1ML (MISCELLANEOUS) IMPLANT
SYR 5ML LL (SYRINGE) IMPLANT
SYR BULB IRRIGATION 50ML (SYRINGE) ×2 IMPLANT
SYR CONTROL 10ML LL (SYRINGE) ×2 IMPLANT
TOWEL GREEN STERILE (TOWEL DISPOSABLE) ×3 IMPLANT
TOWEL GREEN STERILE FF (TOWEL DISPOSABLE) ×3 IMPLANT
WATER STERILE IRR 1000ML POUR (IV SOLUTION) ×3 IMPLANT

## 2020-02-04 NOTE — Plan of Care (Signed)
  Problem: Education: Goal: Knowledge of General Education information will improve Description: Including pain rating scale, medication(s)/side effects and non-pharmacologic comfort measures Outcome: Progressing   Problem: Education: Goal: Knowledge of General Education information will improve Description: Including pain rating scale, medication(s)/side effects and non-pharmacologic comfort measures Outcome: Progressing   Problem: Health Behavior/Discharge Planning: Goal: Ability to manage health-related needs will improve Outcome: Progressing   Problem: Clinical Measurements: Goal: Ability to maintain clinical measurements within normal limits will improve Outcome: Progressing Goal: Will remain free from infection Outcome: Progressing   

## 2020-02-04 NOTE — Anesthesia Postprocedure Evaluation (Signed)
Anesthesia Post Note  Patient: Theodore Welch  Procedure(s) Performed: STERNAL WOUND DEBRIDEMENT WITH WOUND VAC CHANGE (N/A Chest)     Patient location during evaluation: PACU Anesthesia Type: MAC Level of consciousness: awake and alert Pain management: pain level controlled Vital Signs Assessment: post-procedure vital signs reviewed and stable Respiratory status: spontaneous breathing, nonlabored ventilation, respiratory function stable and patient connected to nasal cannula oxygen Cardiovascular status: stable and blood pressure returned to baseline Postop Assessment: no apparent nausea or vomiting Anesthetic complications: no    Last Vitals:  Vitals:   02/04/20 1220 02/04/20 1730  BP: 107/77 102/77  Pulse:    Resp:  (!) 22  Temp:  36.5 C  SpO2:  99%    Last Pain:  Vitals:   02/04/20 1750  TempSrc:   PainSc: 0-No pain                 Darlette Dubow S

## 2020-02-04 NOTE — Progress Notes (Signed)
ANTICOAGULATION CONSULT NOTE  Pharmacy Consult for Heparin to Coumadin Indication: LVAD  Allergies  Allergen Reactions  . Chlorhexidine Rash  . Other Rash    Prep pads    Patient Measurements: Height: 5\' 11"  (180.3 cm) Weight: 181 lb (82.1 kg) IBW/kg (Calculated) : 75.3 Heparin Dosing Weight: 85 kg  Vital Signs: Temp: 97.7 F (36.5 C) (03/19 0800) Temp Source: Oral (03/19 0800) BP: 116/90 (03/19 0800) Pulse Rate: 69 (03/19 0800)  Labs: Recent Labs    02/01/20 1735 02/02/20 0538 02/02/20 0538 02/02/20 0539 02/03/20 0330 02/03/20 0331 02/04/20 0404 02/04/20 0405  HGB  --  8.9*   < >  --  8.8*  --  9.1*  --   HCT  --  28.0*  --   --  27.6*  --  28.2*  --   PLT  --  413*  --   --  438*  --  438*  --   LABPROT  --  14.3  --   --  14.4  --  14.4  --   INR  --  1.1  --   --  1.1  --  1.1  --   HEPARINUNFRC   < >  --   --  <0.10*  --  <0.10*  --  <0.10*  CREATININE  --  0.95  --   --  0.82  --  0.85  --    < > = values in this interval not displayed.    Estimated Creatinine Clearance: 98.4 mL/min (by C-G formula based on SCr of 0.85 mg/dL).   Medical History: Past Medical History:  Diagnosis Date  . 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 Cardiomyopathy, unspecified (HCC)   . CHF (congestive heart failure) (HCC)   . Chronic back pain   . Diabetes mellitus without complication (HCC)   . Enlarged heart   . Gastric ulcer   . Gastroenteritis   . H/O degenerative disc disease   . Hypertension   . LVAD (left ventricular assist device) present [facility])     Assessment: 58yom presenting with concern of large sternal abscess- starting on antibiotics. On warfarin PTA for hx LVAD (goal 2-2.5).  PTA regimen is warfarin 5 mg daily except 7.5 mg TTS.   S/p I&D/driveline exploration yesterday in OR 3/15. No bleeding complications have been noted since. Hgb stable 9.1. LDH stable 438. Patient having wound vac alarms.   Heparin level undetectable as expected on low dose  heparin, increased to 850 units/hr by surgery. INR unchanged at 1.1, have been titrating to home dose.   Goal of Therapy:  Heparin level <0.3 INR 2-2.5 Monitor platelets by anticoagulation protocol: Yes   Plan:  IV heparin continued at 850 units/hr per surgery Check heparin level daily to ensure not > 0.3 and CBC. Warfarin 7.5 mg again tonight  4/15 PharmD., BCPS Clinical Pharmacist 02/04/2020 8:11 AM

## 2020-02-04 NOTE — Anesthesia Preprocedure Evaluation (Addendum)
Anesthesia Evaluation  Patient identified by MRN, date of birth, ID band Patient awake    Reviewed: Allergy & Precautions, NPO status , Patient's Chart, lab work & pertinent test results  Airway Mallampati: II  TM Distance: >3 FB Neck ROM: Full    Dental  (+) Teeth Intact, Dental Advisory Given   Pulmonary sleep apnea , COPD, Current Smoker,    breath sounds clear to auscultation       Cardiovascular hypertension, +CHF   Rhythm:Regular Rate:Normal     Neuro/Psych negative neurological ROS  negative psych ROS   GI/Hepatic Neg liver ROS, PUD,   Endo/Other  diabetes  Renal/GU negative Renal ROS     Musculoskeletal negative musculoskeletal ROS (+)   Abdominal Normal abdominal exam  (+)   Peds  Hematology negative hematology ROS (+)   Anesthesia Other Findings   Reproductive/Obstetrics                            Anesthesia Physical Anesthesia Plan  ASA: IV  Anesthesia Plan: MAC   Post-op Pain Management:    Induction: Intravenous  PONV Risk Score and Plan: Ondansetron and Propofol infusion  Airway Management Planned: Natural Airway and Simple Face Mask  Additional Equipment: None  Intra-op Plan:   Post-operative Plan:   Informed Consent: I have reviewed the patients History and Physical, chart, labs and discussed the procedure including the risks, benefits and alternatives for the proposed anesthesia with the patient or authorized representative who has indicated his/her understanding and acceptance.       Plan Discussed with: CRNA  Anesthesia Plan Comments:        Anesthesia Quick Evaluation

## 2020-02-04 NOTE — Transfer of Care (Signed)
Immediate Anesthesia Transfer of Care Note  Patient: Theodore Welch  Procedure(s) Performed: STERNAL WOUND DEBRIDEMENT WITH WOUND VAC CHANGE (N/A Chest)  Patient Location: PACU  Anesthesia Type:MAC  Level of Consciousness: awake, alert , oriented and patient cooperative  Airway & Oxygen Therapy: Patient Spontanous Breathing and Patient connected to nasal cannula oxygen  Post-op Assessment: Report given to RN and Post -op Vital signs reviewed and stable  Post vital signs: Reviewed and stable  Last Vitals:  Vitals Value Taken Time  BP    Temp    Pulse 65 02/04/20 1732  Resp 25 02/04/20 1732  SpO2 99 % 02/04/20 1732  Vitals shown include unvalidated device data.  Last Pain:  Vitals:   02/04/20 1220  TempSrc:   PainSc: 3       Patients Stated Pain Goal: 0 (56/86/16 8372)  Complications: No apparent anesthesia complications

## 2020-02-04 NOTE — Brief Op Note (Signed)
02/04/2020  5:37 PM  PATIENT:  Michael Boston Lamagna  59 y.o. male  PRE-OPERATIVE DIAGNOSIS:  sternal wound, LVAD driveline wound  POST-OPERATIVE DIAGNOSIS:  sternal wound, LVAD driveline wound  PROCEDURE:  Procedure(s): STERNAL WOUND DEBRIDEMENT WITH WOUND VAC CHANGE (N/A)  SURGEON:  Surgeon(s) and Role:    Kerin Perna, MD - Primary  PHYSICIAN ASSISTANT:   ASSISTANTS: none   ANESTHESIA:   local and IV sedation  EBL:  10 mL   BLOOD ADMINISTERED:none  DRAINS: none   LOCAL MEDICATIONS USED:  LIDOCAINE  and Amount: 5 ml  SPECIMEN:  No Specimen  DISPOSITION OF SPECIMEN:  N/A  COUNTS:  YES  TOURNIQUET:  * No tourniquets in log *  DICTATION: .Dragon Dictation  PLAN OF CARE: return to 2C  PATIENT DISPOSITION:  PACU - hemodynamically stable.   Delay start of Pharmacological VTE agent (>24hrs) due to surgical blood loss or risk of bleeding: yes

## 2020-02-04 NOTE — Progress Notes (Addendum)
Patient ID: Theodore Welch, male   DOB: 1961-02-16, 59 y.o.   MRN: [id number] P    Advanced Heart Failure Rounding Note  PCP-Cardiologist: No primary care provider on file.   Subjective:    To OR 3/10, lower substernal abscess I&D, did not visualize LVAD hardware but able to palpate outflow graft.  Driveline site also with I&D.  Wound vacs placed lower sternal site and driveline site, now removed due to recurrent occlusion and wounds dressed. Back to OR 3/15 for repeat I&D, 2 wound vacs again placed.  Blockage alarm noted on wound vac this am.     WBCs normal at 8.9. AF. Denies subjective fever/ chills. No sternal/abdominal pain.  ID has seen.  Now on cefazolin. Wound culture with MSSA, blood cultures negative so far. PICC placed for home abx.   CT abdomen: subxiphoid fluid collection with superficial and deep components, collection is along the course of the driveline. Cellulitis at surface, no evidence for sternal osteomyelitis.   MAP running low 100s-110.  INR still 1.1.   Only complaint this morning is a slight headache.   LVAD: Heartmate 3. Flow 4.3 L/min, 5700 rpm, PI 4.4  power 4.3. 4 PI events   LDH stable at 154.  Objective:   Weight Range: 82.1 kg Body mass index is 25.24 kg/m.   Vital Signs:   Temp:  [97.6 F (36.4 C)-98.4 F (36.9 C)] 97.6 F (36.4 C) (03/19 0349) Pulse Rate:  [62-74] 64 (03/19 0349) Resp:  [13-20] 15 (03/19 0349) BP: (103-126)/(86-104) 110/98 (03/19 0349) SpO2:  [98 %-100 %] 98 % (03/19 0349) Weight:  [82.1 kg] 82.1 kg (03/19 0619) Last BM Date: 02/01/20  Weight change: Filed Weights   02/02/20 0500 02/03/20 0323 02/04/20 0619  Weight: 82.3 kg 84.1 kg 82.1 kg    Intake/Output:   Intake/Output Summary (Last 24 hours) at 02/04/2020 0744 Last data filed at 02/04/2020 0357 Gross per 24 hour  Intake 986.45 ml  Output 3120 ml  Net -2133.55 ml      Physical Exam    General: Well appearing male, sitting up in bed. NAD.  HEENT:  Normal. Neck: Supple, no JVP. Carotids OK.  Cardiac:  +LVAD Hum Lungs:  CTAB, normal effort.  Abdomen: soft, non distended, non tender, No bruits or masses. +BS  LVAD exit site: Wound vac over driveline site.  Extremities:  Warm and dry. No cyanosis, clubbing, rash, or edema.  Neuro:  Alert & oriented x 3. Cranial nerves grossly intact. Moves all 4 extremities w/o difficulty. Affect pleasant     Telemetry   Unable to interpret rhythm. Lots of artifact. RN to fix leads.   Labs    CBC Recent Labs    02/03/20 0330 02/04/20 0404  WBC 10.2 8.9  HGB 8.8* 9.1*  HCT 27.6* 28.2*  MCV 95.8 95.6  PLT 438* 277*   Basic Metabolic Panel Recent Labs    02/03/20 0330 02/04/20 0404  NA 142 143  K 4.0 3.8  CL 106 106  CO2 27 26  GLUCOSE 97 98  BUN 7 6  CREATININE 0.82 0.85  CALCIUM 8.7* 9.2   Liver Function Tests No results for input(s): AST, ALT, ALKPHOS, BILITOT, PROT, ALBUMIN in the last 72 hours. No results for input(s): LIPASE, AMYLASE in the last 72 hours. Cardiac Enzymes No results for input(s): CKTOTAL, CKMB, CKMBINDEX, TROPONINI in the last 72 hours.  BNP: BNP (last 3 results) No results for input(s): BNP in the last 8760 hours.  ProBNP (  last 3 results) No results for input(s): PROBNP in the last 8760 hours.   D-Dimer No results for input(s): DDIMER in the last 72 hours. Hemoglobin A1C No results for input(s): HGBA1C in the last 72 hours. Fasting Lipid Panel No results for input(s): CHOL, HDL, LDLCALC, TRIG, CHOLHDL, LDLDIRECT in the last 72 hours. Thyroid Function Tests No results for input(s): TSH, T4TOTAL, T3FREE, THYROIDAB in the last 72 hours.  Invalid input(s): FREET3  Other results:   Imaging    No results found.   Medications:     Scheduled Medications: . sodium chloride   Intravenous Once  . acetaminophen  1,000 mg Oral Q6H   Or  . acetaminophen (TYLENOL) oral liquid 160 mg/5 mL  1,000 mg Oral Q6H  . amLODipine  10 mg Oral Daily  .  aspirin EC  81 mg Oral Daily  . atorvastatin  20 mg Oral Daily  . bisacodyl  10 mg Oral Daily  . DULoxetine  60 mg Oral Daily  . fluticasone  1 spray Each Nare Daily  . insulin aspart  0-15 Units Subcutaneous TID WC  . insulin aspart  0-5 Units Subcutaneous QHS  . pantoprazole  40 mg Oral Daily  . sacubitril-valsartan  1 tablet Oral BID  . senna-docusate  1 tablet Oral QHS  . sodium chloride flush  10-40 mL Intracatheter Q12H  . sodium chloride flush  3 mL Intravenous Q12H  . spironolactone  25 mg Oral Daily  . thiamine  100 mg Oral Daily  . vitamin B-12  1,000 mcg Oral Daily  . Warfarin - Physician Dosing Inpatient   Does not apply q1800  . zinc sulfate  220 mg Oral Daily    Infusions: . sodium chloride    . sodium chloride 10 mL/hr at 02/04/20 0512  .  ceFAZolin (ANCEF) IV 2 g (02/04/20 0513)  . heparin 850 Units/hr (02/03/20 1900)    PRN Medications: sodium chloride, sodium chloride, acetaminophen, docusate sodium, fentaNYL (SUBLIMAZE) injection, hydrALAZINE, hydrOXYzine, ipratropium-albuterol, methocarbamol, ondansetron (ZOFRAN) IV, ondansetron (ZOFRAN) IV, ondansetron (ZOFRAN) IV, oxyCODONE, polyethylene glycol, sodium chloride flush, sodium chloride flush, sodium chloride flush, traMADol, zolpidem   Assessment/Plan   1. Subxiphoid abscess: With surrounding cellulitis.  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  CT abdomen showed subxiphoid fluid collection with superficial and deep components, collection is along the course of the driveline. Cellulitis at surface, no evidence for sternal osteomyelitis. S/P I&D 3/10, did not visualize LVAD hardware but able to palpate outflow graft.  Driveline site also with I&D.  Wound vacs placed to lower sternal site and driveline site but now removed with recurrent occlusions.  Now afebrile with WBCs down. MSSA from wound culture. Echo this admission did not show evidence for vegetations  but technically difficult. S/P repeat I&D 3/15, 2 wound vacs placed. Developed air leak 3/18 that was fixed. Wound vac now w/ blockage alarm - Dr. Donata Clay to assess wound vac given new blockage alarm - Continue Cefazolin x 8 wks. PICC placed for home abx - Very concerning infection, especially given prior MSSA driveline infection and already treating with suppressive Keflex. There is a possibility that this could lead to the need for pump exchange, at the least he will need extended IV treatment.  2. 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  Volume status stable. MAPs higher today low 100s-110.  - Continue spironolactone 25 mg daily.  - Continue [name]  97-103 twice a day (home dose).      - Continue amlodipine to 10 mg daily.  - Continue ASA 81 daily. - Warfarin with goal INR 2-2.5. On coumadin, INR 1.1. Adjust dose today. Discussed w/ pharmacy. Continue heparin until heparin 1.8.  - Should be transplant candidate eventually if he can quit smoking.  Will make transplant clinic referral when he is off totally.  3. Smoking: Cutting back.   4. H/o RLE DVT: Has been on warfarin for LVAD.  5. OSA: Continue CPAP.  6. Hyperlipidemia: Atorvastatin.  7. Type II diabetes: SSI while inpatient.     Plan d/c once INR therapeutic.   Length of Stay: [address], PA-C  02/04/2020, 7:44 AM  Advanced Heart Failure Team Pager [phone] (M-F; 7a - 4p)  Please contact [facility] for night-coverage after hours (4p -7a ) and weekends on [email or web address]  Patient seen with PA, agree with the above note.   Wound vac obstructed today, unable to fix at bedside.    MAP still high around 100.   General: Well appearing this am. NAD.  HEENT: Normal. Neck: Supple, JVP 7-8 cm. Carotids OK.  Cardiac:  Mechanical heart sounds with LVAD hum present.  Lungs:  CTAB, normal effort.  Abdomen:  NT, ND, no HSM. No bruits or masses. +BS  LVAD exit site: Covered by  wound vac. Extremities:  Warm and dry. No cyanosis, clubbing, rash, or edema.  Neuro:  Alert & oriented x 3. Cranial nerves grossly intact. Moves all 4 extremities w/o difficulty. Affect pleasant    Will add Bidil 1/2 tab tid for BP.    Continue cefazolin, remains afebrile.   Will need to return to OR later today for wound vac repair.   Marca Ancona 02/04/2020 8:21 AM

## 2020-02-04 NOTE — Progress Notes (Signed)
VAD Coordinator Procedure Note:   VAD Coordinator met patient in OR undergoing sternal and drive line wound irrigation and wound vac changes per Dr. Prescott Gum. Hemodynamics and VAD parameters monitored by myself and anesthesia throughout the procedure. Blood pressures were obtained with automatic cuff on left arm.    Time: Doppler Auto  BP Flow PI Power Speed  Pre-procedure:  1635  120/100 (107) 4.4 4.4 4.3 5700                    Sedation Induction: 1640  111/88 (97) 4.2 5.0 4.3 5700   1645  89/56 (66) 4.5 3.5 4.3 5700   1700  80/67 (74) 4.6 5.4 4.1 5700   1715   4.2 7.6 4.3 5700  Recovery Area: 1735  102/77 (87) 4.5 4.3 4.3 5700   1745  106/91 (98) 4.2 4.3 4.6 5700   1800   3.9 5.4 4.5 5700             Patient tolerated the procedure well. VAD Coordinator accompanied and remained with patient in recovery area.    Patient Disposition: Pt tolerated procedure well. Transported back to 2C02.  Emerson Monte RN [place] Coordinator  Office: [phone]  24/7 Pager: [phone]

## 2020-02-04 NOTE — Progress Notes (Signed)
Clarification of Heparin order.  Spoke with Jill Side, LVAD coordinator to resume Heparin at previous rate.  Will resume.

## 2020-02-04 NOTE — Progress Notes (Signed)
LVAD Coordinator Rounding Note:  Admitted 01/25/20 per Dr. Shirlee Latch with new onset sternal abscess.   HM III LVAD implanted on 08/03/18 by Dr. Dr. Laneta Simmers under Destination Therapy criteria due to smoking status.   Pt sitting up in bed. States he has an ongoing headache since yesterday. BP mildly elevated. Plan to return to OR this afternoon for wound vac change due to blockage alarms.   Vital signs: Temp: 97.7 HR: 69 Doppler Pressure: 96 Automatic BP: 116/90 (101) O2 Sat: 98% RA Wt: 187.4>174.8>178>178.7>184.3>179>181.4>185.4>181  lbs   LVAD interrogation reveals:  Speed: 5700 Flow: 4.3 Power: 4.3w PI: 4.8 Alarms: none  Events: 5 PI today Hematocrit: 28  Fixed speed:  5700 Low speed limit: 5400  Drive Line/sternal wound: CDI. Wound vac alarming blockage. Plan to return to OR this afternoon with Dr Donata Clay.   Labs:  LDH trend: 163>193>159>134>143>136>143>153  INR trend: 3.2>2.2>2.7>1.1  Anticoagulation Plan: -INR Goal: 2.0 - 2.5 -ASA Dose: 81 mg daily -Heparin 850 u/hr-restarted 3/16 -Coumadin restarted 3/15  Device: N/A  Infection:  - 01/25/20 BCs>>negative - 01/25/20 sternal wound culture>>MSSA - 01/26/20 intra-op sternal wound cultures>>MSSA  Plan/Recommendations:  1. Call VAD Coordinator if any VAD or drive line issues. 2. VAD coordinator will accompany patient to OR this afternoon.   Alyce Pagan RN VAD Coordinator  Office: [phone]  24/7 Pager: [phone]

## 2020-02-04 NOTE — Progress Notes (Addendum)
Patient ate breakfast and last sip of water at 8:19 am. He has been made NPO. As discussed with Dr. Donata Clay, he will be taken to OR later today for wound VAC change as there is a blockage.  Pre Procedure note for inpatients:   Theodore Welch has been scheduled for WOUND VAC CHANGE and WOUND IRRIGATION today. The various methods of treatment have been discussed with the patient. After consideration of the risks, benefits and treatment options the patient has consented to the planned procedure.   The patient has been seen and labs reviewed. There are no changes in the patient's condition to prevent proceeding with the planned procedure today.  Recent labs:  Lab Results  Component Value Date   WBC 8.9 02/04/2020   HGB 9.1 (L) 02/04/2020   HCT 28.2 (L) 02/04/2020   PLT 438 (H) 02/04/2020   GLUCOSE 98 02/04/2020   CHOL 97 07/21/2018   TRIG 39 07/21/2018   HDL 32 (L) 07/21/2018   LDLCALC 57 07/21/2018   ALT 14 01/28/2020   AST 17 01/28/2020   NA 143 02/04/2020   K 3.8 02/04/2020   CL 106 02/04/2020   CREATININE 0.85 02/04/2020   BUN 6 02/04/2020   CO2 26 02/04/2020   TSH 0.880 07/24/2018   INR 1.1 02/04/2020   HGBA1C 5.5 01/26/2020    Theodore Perna III, MD 02/04/2020 1:34 PM

## 2020-02-04 NOTE — Progress Notes (Signed)
CPAP set up at bedside pt states he can place himself on/off when ready. I told him to call RT for further assistance.

## 2020-02-05 LAB — BASIC METABOLIC PANEL
Anion gap: 8 (ref 5–15)
BUN: 6 mg/dL (ref 6–20)
CO2: 26 mmol/L (ref 22–32)
Calcium: 9 mg/dL (ref 8.9–10.3)
Chloride: 107 mmol/L (ref 98–111)
Creatinine, Ser: 0.81 mg/dL (ref 0.61–1.24)
GFR calc Af Amer: >60 mL/min (ref >60)
GFR calc non Af Amer: >60 mL/min (ref >60)
Glucose, Bld: 93 mg/dL (ref 70–99)
Potassium: 4.2 mmol/L (ref 3.5–5.1)
Sodium: 141 mmol/L (ref 135–145)

## 2020-02-05 LAB — CBC
HCT: 29.4 % — ABNORMAL LOW (ref 39.0–52.0)
Hemoglobin: 9.2 g/dL — ABNORMAL LOW (ref 13.0–17.0)
MCH: 29.9 pg (ref 26.0–34.0)
MCHC: 31.3 g/dL (ref 30.0–36.0)
MCV: 95.5 fL (ref 80.0–100.0)
Platelets: 414 10*3/uL — ABNORMAL HIGH (ref 150–400)
RBC: 3.08 MIL/uL — ABNORMAL LOW (ref 4.22–5.81)
RDW: 13.8 % (ref 11.5–15.5)
WBC: 9.1 10*3/uL (ref 4.0–10.5)
nRBC: 0 % (ref 0.0–0.2)

## 2020-02-05 LAB — LACTATE DEHYDROGENASE: LDH: 146 U/L (ref 98–192)

## 2020-02-05 LAB — GLUCOSE, CAPILLARY
Glucose-Capillary: 90 mg/dL (ref 70–99)
Glucose-Capillary: 88 mg/dL (ref 70–99)
Glucose-Capillary: 90 mg/dL (ref 70–99)
Glucose-Capillary: 120 mg/dL — ABNORMAL HIGH (ref 70–99)

## 2020-02-05 LAB — PROTIME-INR
INR: 1.3 — ABNORMAL HIGH (ref 0.8–1.2)
Prothrombin Time: 16.1 seconds — ABNORMAL HIGH (ref 11.4–15.2)

## 2020-02-05 LAB — HEPARIN LEVEL (UNFRACTIONATED): Heparin Unfractionated: 0.1 IU/mL — ABNORMAL LOW (ref 0.30–0.70)

## 2020-02-05 MED ORDER — GABAPENTIN 300 MG PO CAPS
600.0000 mg | ORAL_CAPSULE | Freq: Three times a day (TID) | ORAL | Status: DC
  Administered 2020-02-05 – 2020-02-09 (×12): 600 mg via ORAL
  Filled 2020-02-05 (×12): qty 2

## 2020-02-05 MED ORDER — HYDRALAZINE HCL 25 MG PO TABS
25.0000 mg | ORAL_TABLET | Freq: Three times a day (TID) | ORAL | Status: DC
  Administered 2020-02-05 – 2020-02-06 (×4): 25 mg via ORAL
  Filled 2020-02-05 (×4): qty 1

## 2020-02-05 MED ORDER — WARFARIN SODIUM 7.5 MG PO TABS
7.5000 mg | ORAL_TABLET | Freq: Once | ORAL | Status: AC
  Administered 2020-02-05: 7.5 mg via ORAL
  Filled 2020-02-05: qty 1

## 2020-02-05 NOTE — Progress Notes (Signed)
Patient ID: Theodore Welch, male   DOB: [birth year], 59 y.o.   MRN: [id number] P    Advanced Heart Failure Rounding Note  PCP-Cardiologist: No primary care provider on file.   Subjective:    To OR 3/10, lower substernal abscess I&D, did not visualize LVAD hardware but able to palpate outflow graft.  Driveline site also with I&D.  Wound vacs placed lower sternal site and driveline site, now removed due to recurrent occlusion and wounds dressed. Back to OR 3/15 for repeat I&D, 2 wound vacs again placed.  To OR 3/19 to change the wound vac.   Wound vacs appear to be working appropriately today.   WBCs normal. Afebrile. No sternal/abdominal pain.  ID has seen.  Now on cefazolin. Wound culture with MSSA, blood cultures negative. PICC placed for home abx.   CT abdomen: subxiphoid fluid collection with superficial and deep components, collection is along the course of the driveline. Cellulitis at surface, no evidence for sternal osteomyelitis.   MAP 80s generally now.   INR 1.3  Still has headache.  No other neurological symptoms.  He feels that it is related to atorvastatin (does not take at home).   LVAD: Heartmate 3. Flow 4.3 L/min, 5700 rpm, PI 4.6  power 4.4. 15 PI events/24 hrs   LDH stable at 146.  Objective:   Weight Range: 82.9 kg Body mass index is 25.5 kg/m.   Vital Signs:   Temp:  [97.5 F (36.4 C)-98.2 F (36.8 C)] 98 F (36.7 C) (03/20 1109) Pulse Rate:  [61-79] 77 (03/20 1109) Resp:  [11-25] 11 (03/20 1109) BP: (84-117)/(71-100) 84/71 (03/20 1109) SpO2:  [98 %-100 %] 99 % (03/20 0750) Weight:  [82.1 kg-82.9 kg] 82.9 kg (03/20 0303) Last BM Date: 02/04/20  Weight change: Filed Weights   02/04/20 0619 02/04/20 1532 02/05/20 0303  Weight: 82.1 kg 82.1 kg 82.9 kg    Intake/Output:   Intake/Output Summary (Last 24 hours) at 02/05/2020 1156 Last data filed at 02/05/2020 1016 Gross per 24 hour  Intake 984.01 ml  Output 1085 ml  Net -100.99 ml      Physical  Exam    General: Well appearing this am. NAD.  HEENT: Normal. Neck: Supple, JVP 7-8 cm. Carotids OK.  Cardiac:  Mechanical heart sounds with LVAD hum present.  Lungs:  CTAB, normal effort.  Abdomen:  NT, ND, no HSM. No bruits or masses. +BS  LVAD exit site: Well-healed and incorporated. Dressing dry and intact. No erythema or drainage. Stabilization device present and accurately applied. Driveline dressing changed daily per sterile technique. Extremities:  Warm and dry. No cyanosis, clubbing, rash, or edema.  Neuro:  Alert & oriented x 3. Cranial nerves grossly intact. Moves all 4 extremities w/o difficulty. Affect pleasant     Telemetry   NSR 80s, personally reviewed.    Labs    CBC Recent Labs    02/04/20 0404 02/05/20 0630  WBC 8.9 9.1  HGB 9.1* 9.2*  HCT 28.2* 29.4*  MCV 95.6 95.5  PLT 438* 414*   Basic Metabolic Panel Recent Labs    07/37/10 0404 02/05/20 0630  NA 143 141  K 3.8 4.2  CL 106 107  CO2 26 26  GLUCOSE 98 93  BUN 6 6  CREATININE 0.85 0.81  CALCIUM 9.2 9.0   Liver Function Tests No results for input(s): AST, ALT, ALKPHOS, BILITOT, PROT, ALBUMIN in the last 72 hours. No results for input(s): LIPASE, AMYLASE in the last 72 hours. Cardiac Enzymes  No results for input(s): CKTOTAL, CKMB, CKMBINDEX, TROPONINI in the last 72 hours.  BNP: BNP (last 3 results) No results for input(s): BNP in the last 8760 hours.  ProBNP (last 3 results) No results for input(s): PROBNP in the last 8760 hours.   D-Dimer No results for input(s): DDIMER in the last 72 hours. Hemoglobin A1C No results for input(s): HGBA1C in the last 72 hours. Fasting Lipid Panel No results for input(s): CHOL, HDL, LDLCALC, TRIG, CHOLHDL, LDLDIRECT in the last 72 hours. Thyroid Function Tests No results for input(s): TSH, T4TOTAL, T3FREE, THYROIDAB in the last 72 hours.  Invalid input(s): FREET3  Other results:   Imaging    No results found.   Medications:      Scheduled Medications: . sodium chloride   Intravenous Once  . acetaminophen  1,000 mg Oral Q6H   Or  . acetaminophen (TYLENOL) oral liquid 160 mg/5 mL  1,000 mg Oral Q6H  . amLODipine  10 mg Oral Daily  . aspirin EC  81 mg Oral Daily  . bisacodyl  10 mg Oral Daily  . DULoxetine  60 mg Oral Daily  . fluticasone  1 spray Each Nare Daily  . insulin aspart  0-15 Units Subcutaneous TID WC  . insulin aspart  0-5 Units Subcutaneous QHS  . isosorbide-hydrALAZINE  0.5 tablet Oral TID  . nicotine  14 mg Transdermal Daily  . pantoprazole  40 mg Oral Daily  . sacubitril-valsartan  1 tablet Oral BID  . senna-docusate  1 tablet Oral QHS  . sodium chloride flush  10-40 mL Intracatheter Q12H  . sodium chloride flush  3 mL Intravenous Q12H  . spironolactone  25 mg Oral Daily  . thiamine  100 mg Oral Daily  . vitamin B-12  1,000 mcg Oral Daily  . Warfarin - Physician Dosing Inpatient   Does not apply q1800  . zinc sulfate  220 mg Oral Daily    Infusions: . sodium chloride    . sodium chloride 10 mL/hr at 02/04/20 0512  .  ceFAZolin (ANCEF) IV 2 g (02/05/20 0627)  . heparin 850 Units/hr (02/04/20 1002)    PRN Medications: sodium chloride, sodium chloride, acetaminophen, docusate sodium, fentaNYL (SUBLIMAZE) injection, hydrALAZINE, hydrOXYzine, ipratropium-albuterol, methocarbamol, ondansetron (ZOFRAN) IV, ondansetron (ZOFRAN) IV, ondansetron (ZOFRAN) IV, oxyCODONE, polyethylene glycol, sodium chloride flush, sodium chloride flush, sodium chloride flush, traMADol, zolpidem   Assessment/Plan   1. Subxiphoid abscess: With surrounding cellulitis.  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  CT abdomen showed subxiphoid fluid collection with superficial and deep components, collection is along the course of the driveline. Cellulitis at surface, no evidence for sternal osteomyelitis. S/P I&D 3/10, did not visualize LVAD hardware but able to  palpate outflow graft.  Driveline site also with I&D.  Wound vacs placed to lower sternal site and driveline site but now removed with recurrent occlusions.  Now afebrile with WBCs down. MSSA from wound culture. Echo this admission did not show evidence for vegetations but technically difficult. S/P repeat I&D 3/15, 2 wound vacs placed. Developed air leak 3/18 that was fixed. Back to OR 3/19 to change wound vac.  Vacs appear to be functioning normally now.  - Continue Cefazolin x 8 wks. PICC placed for home abx - Very concerning infection, especially given prior MSSA driveline infection and already treating with suppressive Keflex. There is a possibility that this could lead to the need for pump exchange, at the least he will  need extended IV treatment.  2. 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  Volume status stable. MAP coming down, 80s-90s.  - Continue spironolactone 25 mg daily.  - Continue Entresto 97-103 twice a day (home dose).      - Continue amlodipine to 10 mg daily.  - Stop Bidil, ?causing headache.  Use hydralazine 25 mg tid.  - Continue ASA 81 daily. - Warfarin with goal INR 2-2.5. On coumadin, INR 1.3. Adjust dose today. Discussed w/ pharmacy. Continue heparin until heparin 1.8.  - Should be transplant candidate eventually if he can quit smoking.  Will make transplant clinic referral when he is off totally.  3. Smoking: Cutting back.   4. H/o RLE DVT: Has been on warfarin for LVAD.  5. OSA: Continue CPAP.  6. Type II diabetes: SSI while inpatient.   7. Headache: Thinks med-related.  No neurological signs or other symptoms.   - Stop Bidil and atorvastatin as possible related meds.   Plan d/c once INR 1.8.   Length of Stay: 77  Marca Ancona, MD  02/05/2020, 11:56 AM  Advanced Heart Failure Team Pager [phone] (M-F; 7a - 4p)  Please contact [facility] for night-coverage after hours (4p -7a ) and weekends on [email or web address]

## 2020-02-05 NOTE — Plan of Care (Signed)
  Problem: Health Behavior/Discharge Planning: Goal: Ability to manage health-related needs will improve Outcome: Progressing   Problem: Clinical Measurements: Goal: Will remain free from infection Outcome: Progressing Goal: Diagnostic test results will improve Outcome: Progressing Goal: Cardiovascular complication will be avoided Outcome: Progressing   Problem: Coping: Goal: Level of anxiety will decrease Outcome: Progressing   Problem: Elimination: Goal: Will not experience complications related to bowel motility Outcome: Progressing   Problem: Pain Managment: Goal: General experience of comfort will improve Outcome: Progressing   Problem: Safety: Goal: Ability to remain free from injury will improve Outcome: Progressing   Problem: Skin Integrity: Goal: Risk for impaired skin integrity will decrease Outcome: Progressing   Problem: Education: Goal: Patient will understand all VAD equipment and how it functions Outcome: Progressing Goal: Patient will be able to verbalize current INR target range and antiplatelet therapy for discharge home Outcome: Progressing   Problem: Cardiac: Goal: LVAD will function as expected and patient will experience no clinical alarms Outcome: Progressing

## 2020-02-05 NOTE — Plan of Care (Signed)
  Problem: Health Behavior/Discharge Planning: Goal: Ability to manage health-related needs will improve Outcome: Progressing   Problem: Clinical Measurements: Goal: Will remain free from infection Outcome: Progressing Goal: Diagnostic test results will improve Outcome: Progressing Goal: Cardiovascular complication will be avoided Outcome: Progressing   Problem: Coping: Goal: Level of anxiety will decrease Outcome: Progressing   Problem: Elimination: Goal: Will not experience complications related to bowel motility Outcome: Progressing   Problem: Pain Managment: Goal: General experience of comfort will improve Outcome: Progressing   Problem: Safety: Goal: Ability to remain free from injury will improve Outcome: Progressing   Problem: Skin Integrity: Goal: Risk for impaired skin integrity will decrease Outcome: Progressing   Problem: Education: Goal: Patient will understand all VAD equipment and how it functions Outcome: Progressing Goal: Patient will be able to verbalize current INR target range and antiplatelet therapy for discharge home Outcome: Progressing   Problem: Cardiac: Goal: LVAD will function as expected and patient will experience no clinical alarms Outcome: Progressing   

## 2020-02-05 NOTE — Progress Notes (Addendum)
ANTICOAGULATION CONSULT NOTE  Pharmacy Consult for Heparin to Coumadin Indication: LVAD  Allergies  Allergen Reactions  . Chlorhexidine Rash  . Other Rash    Prep pads    Patient Measurements: Height: 5' 10.98" (180.3 cm) Weight: 182 lb 12.2 oz (82.9 kg) IBW/kg (Calculated) : 75.26 Heparin Dosing Weight: 85 kg  Vital Signs: Temp: 97.9 F (36.6 C) (03/20 0303) Temp Source: Oral (03/20 0303) BP: 117/91 (03/20 0303) Pulse Rate: 66 (03/20 0303)  Labs: Recent Labs    02/03/20 0330 02/03/20 0331 02/04/20 0404 02/04/20 0405 02/05/20 0630  HGB 8.8*  --  9.1*  --   --   HCT 27.6*  --  28.2*  --   --   PLT 438*  --  438*  --   --   LABPROT 14.4  --  14.4  --  16.1*  INR 1.1  --  1.1  --  1.3*  HEPARINUNFRC  --  <0.10*  --  <0.10* 0.10*  CREATININE 0.82  --  0.85  --   --     Estimated Creatinine Clearance: 98.4 mL/min (by C-G formula based on SCr of 0.85 mg/dL).   Medical History: Past Medical History:  Diagnosis Date  . 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 Cardiomyopathy, unspecified (HCC)   . CHF (congestive heart failure) (HCC)   . Chronic back pain   . Diabetes mellitus without complication (HCC)   . Enlarged heart   . Gastric ulcer   . Gastroenteritis   . H/O degenerative disc disease   . Hypertension   . LVAD (left ventricular assist device) present [facility])     Assessment: 58yom presenting with concern of large sternal abscess- starting on antibiotics. On warfarin PTA for hx LVAD (goal 2-2.5).  PTA regimen is warfarin 5 mg daily except 7.5 mg TTS.   S/p I&D/driveline exploration yesterday in OR 3/15. No bleeding complications have been noted since. Hgb stable 9.1. LDH stable 438. Patient having wound vac alarms.   Heparin level now 0.1 as expected on low dose heparin, previously increased to 850 units/hr by surgery. INR increased to 1.3, have been titrating to home dose.   Goal of Therapy:  Heparin level <0.3 INR 2-2.5 Monitor platelets by  anticoagulation protocol: Yes   Plan:  IV heparin continued at 850 units/hr per surgery Check heparin level daily to ensure not > 0.3 and daily CBC. Warfarin 7.5 mg x 1 today  Danae Orleans, PharmD, [facility] PGY2 Cardiology Pharmacy Resident Phone [phone] 02/05/2020       7:06 AM  Please check [email or web address] for unit-specific pharmacist phone numbers

## 2020-02-05 NOTE — Progress Notes (Signed)
Pt states he is able to place himself on CPAP dream station when he is ready. Pt setting is 5 cmH2O w/no oxygen bled into the system. RT will continue to monitor.

## 2020-02-06 ENCOUNTER — Inpatient Hospital Stay (HOSPITAL_COMMUNITY): Payer: No Typology Code available for payment source

## 2020-02-06 LAB — CBC
HCT: 27.5 % — ABNORMAL LOW (ref 39.0–52.0)
Hemoglobin: 8.7 g/dL — ABNORMAL LOW (ref 13.0–17.0)
MCH: 30.7 pg (ref 26.0–34.0)
MCHC: 31.6 g/dL (ref 30.0–36.0)
MCV: 97.2 fL (ref 80.0–100.0)
Platelets: 368 10*3/uL (ref 150–400)
RBC: 2.83 MIL/uL — ABNORMAL LOW (ref 4.22–5.81)
RDW: 14 % (ref 11.5–15.5)
WBC: 9.3 10*3/uL (ref 4.0–10.5)
nRBC: 0 % (ref 0.0–0.2)

## 2020-02-06 LAB — LACTATE DEHYDROGENASE: LDH: 151 U/L (ref 98–192)

## 2020-02-06 LAB — PROTIME-INR
INR: 1.7 — ABNORMAL HIGH (ref 0.8–1.2)
Prothrombin Time: 19.8 seconds — ABNORMAL HIGH (ref 11.4–15.2)

## 2020-02-06 LAB — GLUCOSE, CAPILLARY: Glucose-Capillary: 78 mg/dL (ref 70–99)

## 2020-02-06 LAB — HEPARIN LEVEL (UNFRACTIONATED): Heparin Unfractionated: <0.1 IU/mL — ABNORMAL LOW (ref 0.30–0.70)

## 2020-02-06 MED ORDER — HYDRALAZINE HCL 50 MG PO TABS
50.0000 mg | ORAL_TABLET | Freq: Three times a day (TID) | ORAL | Status: DC
  Administered 2020-02-06 – 2020-02-09 (×9): 50 mg via ORAL
  Filled 2020-02-06 (×9): qty 1

## 2020-02-06 MED ORDER — WARFARIN SODIUM 5 MG PO TABS
5.0000 mg | ORAL_TABLET | Freq: Once | ORAL | Status: AC
  Administered 2020-02-06: 5 mg via ORAL
  Filled 2020-02-06: qty 1

## 2020-02-06 MED ORDER — WARFARIN - PHARMACIST DOSING INPATIENT
Freq: Every day | Status: DC
  Administered 2020-02-06: 1

## 2020-02-06 NOTE — Plan of Care (Signed)
  Problem: Clinical Measurements: Goal: Will remain free from infection Outcome: Progressing Goal: Diagnostic test results will improve Outcome: Progressing Goal: Cardiovascular complication will be avoided Outcome: Progressing   Problem: Pain Managment: Goal: General experience of comfort will improve Outcome: Progressing   Problem: Safety: Goal: Ability to remain free from injury will improve Outcome: Progressing   

## 2020-02-06 NOTE — Progress Notes (Signed)
Pt is able to place himself on CPAP dream station for the night. RT ensured humidification chamber is full and settings are the same. Pt is set on home setting of 5 cmH2O w/no oxygen bled into the system. RT will continue to monitor.

## 2020-02-06 NOTE — Op Note (Signed)
NAME: Theodore Welch, Theodore Welch MEDICAL RECORD OB:[id number] ACCOUNT [id number] DATE OF BIRTH:[birth year] FACILITY: [facility] LOCATION: [facility] PHYSICIAN:Ilya Neely VAN TRIGT III, MD  OPERATIVE REPORT  DATE OF PROCEDURE:  02/04/2020  PROCEDURE:  Wound vacuum assisted closure  change and wound irrigation of left ventricular assist device driveline exit site and left ventricular assist device driveline tunnel.  SURGEON:  Kerin Perna, MD  ANESTHESIA:  IV conscious sedation, monitored by anesthesia, and local 1% lidocaine.  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  DESCRIPTION OF PROCEDURE:  The patient was brought to the preoperative holding where informed consent was documented and final issues were addressed and the patient demonstrated understanding and would like to proceed with the procedure, benefits and  risks.  He was then brought to the operating room and placed supine on the operating table.  He was administered IV sedation, monitored by anesthesia, and remained stable.  The patient was accompanied by the VAD coordinator the entire time that he was in  the preoperative area, as well as in the operating room and to the recovery room.  Previously placed wound VAC sponges and tubing were removed, as they were not functional from blockage of the system.  The wound VAC was discarded.  The chest and abdomen were prepped and draped as a sterile field.  A proper time-out was performed.  The wound VAC sites were examined.  Each had significant amount of ACell product placed 4 days previously and were covered with a Sorbact membrane which were intact.  There was no evidence  of blood clot or purulence.  Both sites were irrigated with  vancomycin irrigation.  Both sites were dry.  Some of the corners of the ACell sheet and Sorbact membrane were tidied up and secured with some additional 3-0 silk sutures with local anesthesia.  I then cut 2 new sponge segments to the appropriate size  and configuration.  These were then covered with the wound VAC sheets and the wound VAC suction applicators were applied.  There was good compression of each sponge at -125 mmHg suction.  The patient was reversed from anesthesia and returned to recovery  room in stable condition.  Blood loss was minimal.  JN/NUANCE  D:02/04/2020 T:02/06/2020 JOB:[id number]

## 2020-02-06 NOTE — Progress Notes (Signed)
Paged Jill Side VAD coordinator, pt still c/o headaches, throbbing left side of face and left eye, not effective with oxy, tylenol and tramadol given earlier. New order's rec'd. Will continue to monitor.

## 2020-02-06 NOTE — Progress Notes (Signed)
Patient ID: Theodore Welch, male   DOB: [birth year], 59 y.o.   MRN: [id number] P    Advanced Heart Failure Rounding Note  PCP-Cardiologist: No primary care provider on file.   Subjective:    To OR 3/10, lower substernal abscess I&D, did not visualize LVAD hardware but able to palpate outflow graft.  Driveline site also with I&D.  Wound vacs placed lower sternal site and driveline site, now removed due to recurrent occlusion and wounds dressed. Back to OR 3/15 for repeat I&D, 2 wound vacs again placed.  To OR 3/19 to change the wound vac.   Wound vacs appear to be working appropriately today.   WBCs normal. Afebrile. No sternal/abdominal pain.  ID has seen.  Now on cefazolin. Wound culture with MSSA, blood cultures negative. PICC placed for home abx.   CT abdomen: subxiphoid fluid collection with superficial and deep components, collection is along the course of the driveline. Cellulitis at surface, no evidence for sternal osteomyelitis.   MAP 80s generally now.   INR 1.7  Headache still present but improved off Bidil and atorvastatin.   LVAD: Heartmate 3. Flow 4.5 L/min, 5700 rpm, PI 3.9  power 4.3. Few PI events this morning.  LDH stable at 151.  Objective:   Weight Range: 84.7 kg Body mass index is 26.06 kg/m.   Vital Signs:   Temp:  [98 F (36.7 C)-98.3 F (36.8 C)] 98 F (36.7 C) (03/21 0754) Pulse Rate:  [70-79] 78 (03/21 0754) Resp:  [14-20] 19 (03/21 0754) BP: (93-113)/(54-84) 106/84 (03/21 0754) SpO2:  [98 %-99 %] 98 % (03/21 0754) Weight:  [84.7 kg] 84.7 kg (03/21 0500) Last BM Date: 02/05/20  Weight change: Filed Weights   02/04/20 1532 02/05/20 0303 02/06/20 0500  Weight: 82.1 kg 82.9 kg 84.7 kg    Intake/Output:   Intake/Output Summary (Last 24 hours) at 02/06/2020 1116 Last data filed at 02/06/2020 0800 Gross per 24 hour  Intake 1263.2 ml  Output 100 ml  Net 1163.2 ml      Physical Exam    General: Well appearing this am. NAD.  HEENT:  Normal. Neck: Supple, JVP 7-8 cm. Carotids OK.  Cardiac:  Mechanical heart sounds with LVAD hum present.  Lungs:  CTAB, normal effort.  Abdomen:  NT, ND, no HSM. No bruits or masses. +BS  LVAD exit site: Covered with wound vac Extremities:  Warm and dry. No cyanosis, clubbing, rash, or edema.  Neuro:  Alert & oriented x 3. Cranial nerves grossly intact. Moves all 4 extremities w/o difficulty. Affect pleasant     Telemetry   NSR 80s, personally reviewed.    Labs    CBC Recent Labs    02/05/20 0630 02/06/20 0435  WBC 9.1 9.3  HGB 9.2* 8.7*  HCT 29.4* 27.5*  MCV 95.5 97.2  PLT 414* 368   Basic Metabolic Panel Recent Labs    38/18/29 0404 02/05/20 0630  NA 143 141  K 3.8 4.2  CL 106 107  CO2 26 26  GLUCOSE 98 93  BUN 6 6  CREATININE 0.85 0.81  CALCIUM 9.2 9.0   Liver Function Tests No results for input(s): AST, ALT, ALKPHOS, BILITOT, PROT, ALBUMIN in the last 72 hours. No results for input(s): LIPASE, AMYLASE in the last 72 hours. Cardiac Enzymes No results for input(s): CKTOTAL, CKMB, CKMBINDEX, TROPONINI in the last 72 hours.  BNP: BNP (last 3 results) No results for input(s): BNP in the last 8760 hours.  ProBNP (last 3 results)  No results for input(s): PROBNP in the last 8760 hours.   D-Dimer No results for input(s): DDIMER in the last 72 hours. Hemoglobin A1C No results for input(s): HGBA1C in the last 72 hours. Fasting Lipid Panel No results for input(s): CHOL, HDL, LDLCALC, TRIG, CHOLHDL, LDLDIRECT in the last 72 hours. Thyroid Function Tests No results for input(s): TSH, T4TOTAL, T3FREE, THYROIDAB in the last 72 hours.  Invalid input(s): FREET3  Other results:   Imaging    No results found.   Medications:     Scheduled Medications:  sodium chloride   Intravenous Once   amLODipine  10 mg Oral Daily   aspirin EC  81 mg Oral Daily   bisacodyl  10 mg Oral Daily   DULoxetine  60 mg Oral Daily   fluticasone  1 spray Each Nare  Daily   gabapentin  600 mg Oral TID   hydrALAZINE  25 mg Oral Q8H   nicotine  14 mg Transdermal Daily   pantoprazole  40 mg Oral Daily   sacubitril-valsartan  1 tablet Oral BID   senna-docusate  1 tablet Oral QHS   sodium chloride flush  10-40 mL Intracatheter Q12H   sodium chloride flush  3 mL Intravenous Q12H   spironolactone  25 mg Oral Daily   thiamine  100 mg Oral Daily   vitamin B-12  1,000 mcg Oral Daily   Warfarin - Physician Dosing Inpatient   Does not apply q1800   zinc sulfate  220 mg Oral Daily    Infusions:  sodium chloride     sodium chloride Stopped (02/04/20 1517)    ceFAZolin (ANCEF) IV 2 g (02/06/20 0625)   heparin 850 Units/hr (02/05/20 2153)    PRN Medications: sodium chloride, sodium chloride, acetaminophen, docusate sodium, fentaNYL (SUBLIMAZE) injection, hydrALAZINE, hydrOXYzine, ipratropium-albuterol, methocarbamol, ondansetron (ZOFRAN) IV, ondansetron (ZOFRAN) IV, ondansetron (ZOFRAN) IV, oxyCODONE, polyethylene glycol, sodium chloride flush, sodium chloride flush, sodium chloride flush, traMADol, zolpidem   Assessment/Plan   1. Subxiphoid abscess: With surrounding cellulitis.  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  CT abdomen showed subxiphoid fluid collection with superficial and deep components, collection is along the course of the driveline. Cellulitis at surface, no evidence for sternal osteomyelitis. S/P I&D 3/10, did not visualize LVAD hardware but able to palpate outflow graft.  Driveline site also with I&D.  Wound vacs placed to lower sternal site and driveline site but now removed with recurrent occlusions.  Now afebrile with WBCs down. MSSA from wound culture. Echo this admission did not show evidence for vegetations but technically difficult. S/P repeat I&D 3/15, 2 wound vacs placed. Developed air leak 3/18 that was fixed. Back to OR 3/19 to change wound vac.  Vacs appear to  be functioning normally now.  - Continue Cefazolin x 8 wks. PICC placed for home abx - Very concerning infection, especially given prior MSSA driveline infection and already treating with suppressive Keflex. There is a possibility that this could lead to the need for pump exchange, at the least he will need extended IV treatment.  2. 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  Volume status stable. MAP now stable in 80s. - Continue spironolactone 25 mg daily.  - Continue Entresto 97-103 twice a day (home dose).      - Continue amlodipine to 10 mg daily.  - Continue hydralazine 25 mg tid.  - Continue ASA 81 daily. - Warfarin with goal  INR 2-2.5. On coumadin, INR 1.7. Adjust dose today. Discussed w/ pharmacy. Continue heparin until INR 1.8.  - Should be transplant candidate eventually if he can quit smoking.  Will make transplant clinic referral when he is off totally.  3. Smoking: Cutting back.   4. H/o RLE DVT: Has been on warfarin for LVAD.  5. OSA: Continue CPAP.  6. Type II diabetes: SSI while inpatient.   7. Headache: Thinks med-related.  No neurological signs or other symptoms.  Improved off Bidil and atorvastatin.   Hopefully will be ready for home tomorrow.  Will have Dr. Donata Clay assess wounds prior.   Length of Stay: 40  Marca Ancona, MD  02/06/2020, 11:16 AM  Advanced Heart Failure Team Pager [phone] (M-F; 7a - 4p)  Please contact [facility] for night-coverage after hours (4p -7a ) and weekends on [email or web address]

## 2020-02-06 NOTE — Progress Notes (Signed)
Notified VAD coordinator of CT scan of head done and of results.

## 2020-02-06 NOTE — Progress Notes (Signed)
ANTICOAGULATION CONSULT NOTE  Pharmacy Consult for Heparin to Coumadin Indication: LVAD  Allergies  Allergen Reactions  . Chlorhexidine Rash  . Other Rash    Prep pads    Patient Measurements: Height: 5' 10.98" (180.3 cm) Weight: 186 lb 11.7 oz (84.7 kg) IBW/kg (Calculated) : 75.26 Heparin Dosing Weight: 85 kg  Vital Signs: Temp: 98.3 F (36.8 C) (03/21 1122) Temp Source: Oral (03/21 1122) BP: 106/87 (03/21 1122) Pulse Rate: 70 (03/21 1122)  Labs: Recent Labs    02/04/20 0404 02/04/20 0404 02/04/20 0405 02/05/20 0630 02/06/20 0435 02/06/20 0936  HGB 9.1*   < >  --  9.2* 8.7*  --   HCT 28.2*  --   --  29.4* 27.5*  --   PLT 438*  --   --  414* 368  --   LABPROT 14.4  --   --  16.1* 19.8*  --   INR 1.1  --   --  1.3* 1.7*  --   HEPARINUNFRC  --   --  <0.10* 0.10*  --  <0.10*  CREATININE 0.85  --   --  0.81  --   --    < > = values in this interval not displayed.    Estimated Creatinine Clearance: 103.3 mL/min (by C-G formula based on SCr of 0.81 mg/dL).   Medical History: Past Medical History:  Diagnosis Date  . 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 Cardiomyopathy, unspecified (HCC)   . CHF (congestive heart failure) (HCC)   . Chronic back pain   . Diabetes mellitus without complication (HCC)   . Enlarged heart   . Gastric ulcer   . Gastroenteritis   . H/O degenerative disc disease   . Hypertension   . LVAD (left ventricular assist device) present [facility])     Assessment: 58yom presenting with concern of large sternal abscess- starting on antibiotics. On warfarin PTA for hx LVAD (goal 2-2.5).  PTA regimen is warfarin 5 mg daily except 7.5 mg TTS.   S/p I&D/driveline exploration yesterday in OR 3/15. No bleeding complications have been noted since. Hgb stable 9.1. LDH stable 438. Patient having wound vac alarms.   Heparin level now undetectable again as expected on low dose heparin, previously increased to 850 units/hr by surgery. INR increased to 1.7,  have been titrating to home dose.   Goal of Therapy:  Heparin level <0.3 INR 2-2.5 Monitor platelets by anticoagulation protocol: Yes   Plan:  IV heparin continued at 850 units/hr per surgery Check heparin level daily to ensure not > 0.3 and daily CBC. Warfarin 5 mg x 1 today INR daily  Danae Orleans, PharmD, [facility] PGY2 Cardiology Pharmacy Resident Phone [phone] 02/06/2020       12:10 PM  Please check [email or web address] for unit-specific pharmacist phone numbers

## 2020-02-07 LAB — HEPARIN LEVEL (UNFRACTIONATED): Heparin Unfractionated: 0.1 IU/mL — ABNORMAL LOW (ref 0.30–0.70)

## 2020-02-07 LAB — BASIC METABOLIC PANEL
Anion gap: 10 (ref 5–15)
BUN: 6 mg/dL (ref 6–20)
CO2: 26 mmol/L (ref 22–32)
Calcium: 9 mg/dL (ref 8.9–10.3)
Chloride: 105 mmol/L (ref 98–111)
Creatinine, Ser: 0.84 mg/dL (ref 0.61–1.24)
GFR calc Af Amer: >60 mL/min (ref >60)
GFR calc non Af Amer: >60 mL/min (ref >60)
Glucose, Bld: 97 mg/dL (ref 70–99)
Potassium: 3.9 mmol/L (ref 3.5–5.1)
Sodium: 141 mmol/L (ref 135–145)

## 2020-02-07 LAB — PROTIME-INR
INR: 1.5 — ABNORMAL HIGH (ref 0.8–1.2)
Prothrombin Time: 18.3 seconds — ABNORMAL HIGH (ref 11.4–15.2)

## 2020-02-07 LAB — CBC
HCT: 29.1 % — ABNORMAL LOW (ref 39.0–52.0)
Hemoglobin: 9.2 g/dL — ABNORMAL LOW (ref 13.0–17.0)
MCH: 30.5 pg (ref 26.0–34.0)
MCHC: 31.6 g/dL (ref 30.0–36.0)
MCV: 96.4 fL (ref 80.0–100.0)
Platelets: 347 10*3/uL (ref 150–400)
RBC: 3.02 MIL/uL — ABNORMAL LOW (ref 4.22–5.81)
RDW: 13.9 % (ref 11.5–15.5)
WBC: 7.6 10*3/uL (ref 4.0–10.5)
nRBC: 0 % (ref 0.0–0.2)

## 2020-02-07 LAB — LACTATE DEHYDROGENASE: LDH: 146 U/L (ref 98–192)

## 2020-02-07 MED ORDER — WARFARIN SODIUM 10 MG PO TABS
10.0000 mg | ORAL_TABLET | Freq: Once | ORAL | Status: AC
  Administered 2020-02-07: 10 mg via ORAL
  Filled 2020-02-07: qty 1

## 2020-02-07 MED ORDER — WARFARIN - PHARMACIST DOSING INPATIENT: Freq: Every day | Status: DC

## 2020-02-07 NOTE — Progress Notes (Addendum)
Patient ID: Theodore Welch, male   DOB: [birth year], 59 y.o.   MRN: [id number] P    Advanced Heart Failure Rounding Note  PCP-Cardiologist: No primary care provider on file.   Subjective:    To OR 3/10, lower substernal abscess I&D, did not visualize LVAD hardware but able to palpate outflow graft.  Driveline site also with I&D.  Wound vacs placed lower sternal site and driveline site, now removed due to recurrent occlusion and wounds dressed. Back to OR 3/15 for repeat I&D, 2 wound vacs again placed.  To OR 3/19 to change the wound vac.   Wound vacs appear to be working appropriately today.   WBCs normal. Afebrile. No sternal/abdominal pain.  ID has seen.  Now on cefazolin. Wound culture with MSSA, blood cultures negative. PICC placed for home abx.   CT abdomen: subxiphoid fluid collection with superficial and deep components, collection is along the course of the driveline. Cellulitis at surface, no evidence for sternal osteomyelitis.   MAP 80s- 90s   INR 1.5 today   Headaches improved of Bidil. Head CT 3/21 negative for acute process.   LVAD: Heartmate 3. Flow 4.8 L/min, 5700 rpm, PI 3.7  power 4.4. Multiple events but no low flows.  LDH stable at 146  Objective:   Weight Range: 82.8 kg Body mass index is 25.47 kg/m.   Vital Signs:   Temp:  [97.5 F (36.4 C)-98.3 F (36.8 C)] 98.1 F (36.7 C) (03/22 0747) Pulse Rate:  [65-83] 83 (03/22 0300) Resp:  [14-20] 19 (03/22 0747) BP: (101-119)/(74-90) 106/85 (03/22 0747) SpO2:  [97 %-100 %] 99 % (03/22 0300) Weight:  [82.8 kg] 82.8 kg (03/22 0444) Last BM Date: 02/06/20  Weight change: Filed Weights   02/05/20 0303 02/06/20 0500 02/07/20 0444  Weight: 82.9 kg 84.7 kg 82.8 kg    Intake/Output:   Intake/Output Summary (Last 24 hours) at 02/07/2020 0829 Last data filed at 02/07/2020 0747 Gross per 24 hour  Intake 1135.51 ml  Output 2270 ml  Net -1134.49 ml      Physical Exam    PHYSICAL EXAM: General:  Well  appearing AAM. No respiratory difficulty HEENT: normal Neck: supple. no JVD. Carotids 2+ bilat; no bruits. No lymphadenopathy or thyromegaly appreciated. Cor: + LVAD HUM  Lungs: clear Abdomen: soft, nontender, nondistended. No hepatosplenomegaly. No bruits or masses. Good bowel sounds. LVAD Driveline Site: covered w/ wound vac Extremities: no cyanosis, clubbing, rash, edema Neuro: alert & oriented x 3, cranial nerves grossly intact. moves all 4 extremities w/o difficulty. Affect pleasant.   Telemetry   NSR 80s, personally reviewed.    Labs    CBC Recent Labs    02/06/20 0435 02/07/20 0437  WBC 9.3 7.6  HGB 8.7* 9.2*  HCT 27.5* 29.1*  MCV 97.2 96.4  PLT 368 347   Basic Metabolic Panel Recent Labs    69/45/03 0630 02/07/20 0437  NA 141 141  K 4.2 3.9  CL 107 105  CO2 26 26  GLUCOSE 93 97  BUN 6 6  CREATININE 0.81 0.84  CALCIUM 9.0 9.0   Liver Function Tests No results for input(s): AST, ALT, ALKPHOS, BILITOT, PROT, ALBUMIN in the last 72 hours. No results for input(s): LIPASE, AMYLASE in the last 72 hours. Cardiac Enzymes No results for input(s): CKTOTAL, CKMB, CKMBINDEX, TROPONINI in the last 72 hours.  BNP: BNP (last 3 results) No results for input(s): BNP in the last 8760 hours.  ProBNP (last 3 results) No results for input(s):  PROBNP in the last 8760 hours.   D-Dimer No results for input(s): DDIMER in the last 72 hours. Hemoglobin A1C No results for input(s): HGBA1C in the last 72 hours. Fasting Lipid Panel No results for input(s): CHOL, HDL, LDLCALC, TRIG, CHOLHDL, LDLDIRECT in the last 72 hours. Thyroid Function Tests No results for input(s): TSH, T4TOTAL, T3FREE, THYROIDAB in the last 72 hours.  Invalid input(s): FREET3  Other results:   Imaging    CT HEAD WO CONTRAST  Result Date: 02/06/2020 CLINICAL DATA:  Headache EXAM: CT HEAD WITHOUT CONTRAST TECHNIQUE: Contiguous axial images were obtained from the base of the skull through the  vertex without intravenous contrast. COMPARISON:  None. FINDINGS: Brain: There is no acute intracranial hemorrhage, mass effect, or edema. No acute appearing loss of gray-white differentiation. There is right inferior frontal encephalomalacia. Additional patchy hypoattenuation in the supratentorial white matter is nonspecific but may reflect minor chronic microvascular ischemic changes. There is no extra-axial fluid collection. Ventricles and sulci are within normal limits in size and configuration. Vascular: There is atherosclerotic calcification at the skull base. Skull: Calvarium is unremarkable. Sinuses/Orbits: No acute finding. Other: None. IMPRESSION: No acute intracranial hemorrhage, mass effect, or evidence of acute infarction. Right inferior frontal encephalomalacia. Minor chronic microvascular ischemic changes. Electronically Signed   By: Guadlupe Spanish M.D.   On: 02/06/2020 17:55     Medications:     Scheduled Medications: . sodium chloride   Intravenous Once  . amLODipine  10 mg Oral Daily  . aspirin EC  81 mg Oral Daily  . bisacodyl  10 mg Oral Daily  . DULoxetine  60 mg Oral Daily  . fluticasone  1 spray Each Nare Daily  . gabapentin  600 mg Oral TID  . hydrALAZINE  50 mg Oral Q8H  . nicotine  14 mg Transdermal Daily  . pantoprazole  40 mg Oral Daily  . sacubitril-valsartan  1 tablet Oral BID  . senna-docusate  1 tablet Oral QHS  . sodium chloride flush  10-40 mL Intracatheter Q12H  . sodium chloride flush  3 mL Intravenous Q12H  . spironolactone  25 mg Oral Daily  . thiamine  100 mg Oral Daily  . vitamin B-12  1,000 mcg Oral Daily  . Warfarin - Pharmacist Dosing Inpatient   Does not apply q1600  . zinc sulfate  220 mg Oral Daily    Infusions: . sodium chloride    . sodium chloride Stopped (02/04/20 1517)  .  ceFAZolin (ANCEF) IV 2 g (02/07/20 0705)  . heparin 850 Units/hr (02/07/20 0700)    PRN Medications: sodium chloride, sodium chloride, acetaminophen, docusate  sodium, fentaNYL (SUBLIMAZE) injection, hydrALAZINE, hydrOXYzine, ipratropium-albuterol, methocarbamol, ondansetron (ZOFRAN) IV, ondansetron (ZOFRAN) IV, ondansetron (ZOFRAN) IV, oxyCODONE, polyethylene glycol, sodium chloride flush, sodium chloride flush, sodium chloride flush, traMADol, zolpidem   Assessment/Plan   1. Subxiphoid abscess: With surrounding cellulitis.  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  CT abdomen showed subxiphoid fluid collection with superficial and deep components, collection is along the course of the driveline. Cellulitis at surface, no evidence for sternal osteomyelitis. S/P I&D 3/10, did not visualize LVAD hardware but able to palpate outflow graft.  Driveline site also with I&D.  Wound vacs placed to lower sternal site and driveline site but now removed with recurrent occlusions.  Now afebrile with WBCs down. MSSA from wound culture. Echo this admission did not show evidence for vegetations but technically difficult. S/P repeat I&D  3/15, 2 wound vacs placed. Developed air leak 3/18 that was fixed. Back to OR 3/19 to change wound vac.  Vacs appear to be functioning normally now.  - Continue Cefazolin x 8 wks. PICC placed for home abx - Very concerning infection, especially given prior MSSA driveline infection and already treating with suppressive Keflex. There is a possibility that this could lead to the need for pump exchange, at the least he will need extended IV treatment.  2. 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  Volume status stable. MAP now stable in 80-90s. - Continue spironolactone 25 mg daily.  - Continue Entresto 97-103 twice a day (home dose).      - Continue amlodipine to 10 mg daily.  - Continue hydralazine 50 mg tid.  - Intolerant to Bidil (HAs) - Continue ASA 81 daily. - Warfarin with goal INR 2-2.5. On coumadin, INR 1.5. Adjust dose today. Discussed w/  pharmacy. Continue heparin until INR 1.8.  - Should be transplant candidate eventually if he can quit smoking.  Will make transplant clinic referral when he is off totally.  3. Smoking: Cutting back.   4. H/o RLE DVT: Has been on warfarin for LVAD.  5. OSA: Continue CPAP.  6. Type II diabetes: SSI while inpatient.   7. Headache: Thinks med-related.  No neurological signs or other symptoms.  Improved off Bidil and atorvastatin. Head CT 3/21 unremarkable.    Length of Stay: [address] PA-C  02/07/2020, 8:29 AM  Advanced Heart Failure Team Pager [phone] (M-F; 7a - 4p)  Please contact [facility] for night-coverage after hours (4p -7a ) and weekends on [email or web address]  Patient seen with PA, agree with the above note.   MAP 80s-90s.  Still with headache from time to time but somewhat better.  CT head showed no acute changes.   General: Well appearing this am. NAD.  HEENT: Normal. Neck: Supple, JVP 7-8 cm. Carotids OK.  Cardiac:  Mechanical heart sounds with LVAD hum present.  Lungs:  CTAB, normal effort.  Abdomen:  NT, ND, no HSM. No bruits or masses. +BS  LVAD exit site: Wound vac over site.  Extremities:  Warm and dry. No cyanosis, clubbing, rash, or edema.  Neuro:  Alert & oriented x 3. Cranial nerves grossly intact. Moves all 4 extremities w/o difficulty. Affect pleasant    INR 1.5 today, will increase warfarin and stop heparin/send home if INR > 1.7 tomorrow.   Wound vacs to remain in place.   Continue IV cefazolin total of 8 wks.   Continue current BP management, MAP improved.  ?If headaches due to BP, he had no acute changes on CT head and has been off nitrates a couple of days.  No history of migraines.   Loralie Champagne 02/07/2020 9:10 AM

## 2020-02-07 NOTE — Progress Notes (Signed)
ANTICOAGULATION CONSULT NOTE  Pharmacy Consult for Heparin to Coumadin Indication: LVAD  Allergies  Allergen Reactions  . Chlorhexidine Rash  . Other Rash    Prep pads    Patient Measurements: Height: 5' 10.98" (180.3 cm) Weight: 182 lb 8.7 oz (82.8 kg) IBW/kg (Calculated) : 75.26 Heparin Dosing Weight: 85 kg  Vital Signs: Temp: 98.1 F (36.7 C) (03/22 0747) Temp Source: Oral (03/22 0747) BP: 106/85 (03/22 0747) Pulse Rate: 83 (03/22 0300)  Labs: Recent Labs    02/05/20 0630 02/05/20 0630 02/06/20 0435 02/06/20 0936 02/07/20 0437  HGB 9.2*   < > 8.7*  --  9.2*  HCT 29.4*  --  27.5*  --  29.1*  PLT 414*  --  368  --  347  LABPROT 16.1*  --  19.8*  --  18.3*  INR 1.3*  --  1.7*  --  1.5*  HEPARINUNFRC 0.10*  --   --  <0.10* 0.10*  CREATININE 0.81  --   --   --  0.84   < > = values in this interval not displayed.    Estimated Creatinine Clearance: 99.6 mL/min (by C-G formula based on SCr of 0.84 mg/dL).   Medical History: Past Medical History:  Diagnosis Date  . 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 Cardiomyopathy, unspecified (HCC)   . CHF (congestive heart failure) (HCC)   . Chronic back pain   . Diabetes mellitus without complication (HCC)   . Enlarged heart   . Gastric ulcer   . Gastroenteritis   . H/O degenerative disc disease   . Hypertension   . LVAD (left ventricular assist device) present [facility])     Assessment: 58yom presenting with concern of large sternal abscess- starting on antibiotics. On warfarin PTA for hx LVAD (goal 2-2.5).  PTA regimen is warfarin 5 mg daily except 7.5 mg TTS.   S/p I&D/driveline exploration yesterday in OR 3/15. No bleeding complications have been noted since. Hgb stable 9.1. LDH stable 438. Patient having wound vac alarms.   Heparin level undetectable again as expected on low dose heparin, previously increased to 850 units/hr by surgery. INR 1.5 - s/p lower dose of warfarin last pm and pt ate a lot of collard greens  with dinner. Will boost tonight and anticipate dc in am   Goal of Therapy:  Heparin level <0.3 INR 2-2.5 Monitor platelets by anticoagulation protocol: Yes   Plan:  IV heparin continued at 850 units/hr per surgery Check heparin level daily to ensure not > 0.3 and daily CBC. Warfarin 10 mg x 1 today INR daily  [name] Pharm.D. CPP, BCPS Clinical Pharmacist [phone] 02/07/2020 10:33 AM    Please check [email or web address] for unit-specific pharmacist phone numbers

## 2020-02-07 NOTE — Progress Notes (Signed)
LVAD Coordinator Rounding Note:  Admitted 01/25/20 per Dr. Shirlee Latch with new onset sternal abscess.   HM III LVAD implanted on 08/03/18 by Dr. Dr. Laneta Simmers under Destination Therapy criteria due to smoking status.   Pt sitting up in bed. States he has still has mild HA, CT of head negative for any acute process, BP is better today.  Vital signs: Temp: 98.1 HR: 70 Doppler Pressure: 92 Automatic BP: 106/85 (94) O2 Sat: 99% RA Wt: 187.4>174.8>178>178.7>184.3>179>181.4>185.4>181>182.5  lbs   LVAD interrogation reveals:  Speed: 5700 Flow: 4.8 Power: 4.3w PI: 3.3 Alarms: none  Events: 10-20 daily Hematocrit: 29  Fixed speed:  5700 Low speed limit: 5400  Drive Line/sternal wound: CDI. Wound vac WNL.  Labs:  LDH trend: 158>309>407>680>881>103>159>458>592  INR trend: 3.2>2.2>2.7>1.1>1.5  Anticoagulation Plan: -INR Goal: 2.0 - 2.5 -ASA Dose: 81 mg daily -Heparin 850 u/hr -Coumadin restarted 3/15  Device: N/A  Infection:  - 01/25/20 BCs>>negative - 01/25/20 sternal wound culture>>MSSA - 01/26/20 intra-op sternal wound cultures>>MSSA  Plan/Recommendations:  1. Call VAD Coordinator if any VAD or drive line issues.   [name] RN VAD Coordinator  Office: [phone]  24/7 Pager: [phone]

## 2020-02-08 LAB — BASIC METABOLIC PANEL
Anion gap: 7 (ref 5–15)
BUN: 6 mg/dL (ref 6–20)
CO2: 27 mmol/L (ref 22–32)
Calcium: 9.2 mg/dL (ref 8.9–10.3)
Chloride: 107 mmol/L (ref 98–111)
Creatinine, Ser: 0.83 mg/dL (ref 0.61–1.24)
GFR calc Af Amer: >60 mL/min (ref >60)
GFR calc non Af Amer: >60 mL/min (ref >60)
Glucose, Bld: 102 mg/dL — ABNORMAL HIGH (ref 70–99)
Potassium: 4.1 mmol/L (ref 3.5–5.1)
Sodium: 141 mmol/L (ref 135–145)

## 2020-02-08 LAB — LACTATE DEHYDROGENASE: LDH: 156 U/L (ref 98–192)

## 2020-02-08 LAB — CBC
HCT: 29.9 % — ABNORMAL LOW (ref 39.0–52.0)
Hemoglobin: 9.5 g/dL — ABNORMAL LOW (ref 13.0–17.0)
MCH: 30.4 pg (ref 26.0–34.0)
MCHC: 31.8 g/dL (ref 30.0–36.0)
MCV: 95.8 fL (ref 80.0–100.0)
Platelets: 331 10*3/uL (ref 150–400)
RBC: 3.12 MIL/uL — ABNORMAL LOW (ref 4.22–5.81)
RDW: 14.2 % (ref 11.5–15.5)
WBC: 7.4 10*3/uL (ref 4.0–10.5)
nRBC: 0 % (ref 0.0–0.2)

## 2020-02-08 LAB — PROTIME-INR
INR: 1.5 — ABNORMAL HIGH (ref 0.8–1.2)
INR: 1.6 — ABNORMAL HIGH (ref 0.8–1.2)
Prothrombin Time: 17.6 seconds — ABNORMAL HIGH (ref 11.4–15.2)
Prothrombin Time: 18.7 seconds — ABNORMAL HIGH (ref 11.4–15.2)

## 2020-02-08 LAB — HEPARIN LEVEL (UNFRACTIONATED): Heparin Unfractionated: 0.11 IU/mL — ABNORMAL LOW (ref 0.30–0.70)

## 2020-02-08 MED ORDER — WARFARIN SODIUM 10 MG PO TABS
10.0000 mg | ORAL_TABLET | Freq: Once | ORAL | Status: AC
  Administered 2020-02-08: 18:00:00 10 mg via ORAL
  Filled 2020-02-08: qty 1

## 2020-02-08 NOTE — Progress Notes (Signed)
Paged MD to advise of patient compliant of swelling in the left anterior portion of head. Patient has had a headache for most of the day.  Awaiting response. RN will continue to monitor.

## 2020-02-08 NOTE — Progress Notes (Signed)
4 Days Post-Op Procedure(s) (LRB): STERNAL WOUND DEBRIDEMENT WITH WOUND VAC CHANGE (N/A) Subjective: Both wound VAC sites with functional sponges placed 4 days ago Ready for outpatient management whrn anticoagulation adequate- INR 1.5   Objective: Vital signs in last 24 hours: Temp:  [97.6 F (36.4 C)-98.9 F (37.2 C)] 98.9 F (37.2 C) (03/23 0702) Pulse Rate:  [74-94] 94 (03/23 0702) Cardiac Rhythm: Normal sinus rhythm (03/23 0702) Resp:  [12-23] 14 (03/23 0702) BP: (92-104)/(66-87) 99/66 (03/23 0817) SpO2:  [98 %-99 %] 99 % (03/23 0702) Weight:  [83.3 kg] 83.3 kg (03/23 0341)  Hemodynamic parameters for last 24 hours:   stable Intake/Output from previous day: 03/22 0701 - 03/23 0700 In: 1369.2 [P.O.:720; I.V.:172.9; IV Piggyback:476.3] Out: 2445 [Urine:2445] Intake/Output this shift: Total I/O In: 240 [P.O.:240] Out: -        Exam    General- alert and comfortable    Neck- no JVD, no cervical adenopathy palpable, no carotid bruit   Lungs- clear without rales, wheezes   Cor- regular rate and rhythm, normal VAD hum   Abdomen- soft, non-tender   Extremities - warm, non-tender, minimal edema   Neuro- oriented, appropriate, no focal weakness   Lab Results: Recent Labs    02/07/20 0437 02/08/20 0357  WBC 7.6 7.4  HGB 9.2* 9.5*  HCT 29.1* 29.9*  PLT 347 331   BMET:  Recent Labs    02/07/20 0437 02/08/20 0357  NA 141 141  K 3.9 4.1  CL 105 107  CO2 26 27  GLUCOSE 97 102*  BUN 6 6  CREATININE 0.84 0.83  CALCIUM 9.0 9.2    PT/INR:  Recent Labs    02/08/20 0357  LABPROT 17.6*  INR 1.5*   ABG    Component Value Date/Time   PHART 7.625 (HH) 01/25/2020 2108   HCO3 23.6 01/25/2020 2108   TCO2 26 08/04/2018 1622   O2SAT 97.9 01/25/2020 2108   CBG (last 3)  Recent Labs    02/05/20 1601 02/05/20 2120 02/06/20 0620  GLUCAP 90 120* 78    Assessment/Plan: S/P Procedure(s) (LRB): STERNAL WOUND DEBRIDEMENT WITH WOUND VAC CHANGE (N/A) Plan VAC  change Fri in VAD clinic   LOS: 14 days    [name] 02/08/2020

## 2020-02-08 NOTE — Progress Notes (Signed)
LVAD Coordinator Rounding Note:  Admitted 01/25/20 per Dr. Shirlee Latch with new onset sternal abscess.   HM III LVAD implanted on 08/03/18 by Dr. Dr. Laneta Simmers under Destination Therapy criteria due to smoking status.   Pt lying in bed asleep.   Vital signs: Temp: 98.9 HR: 80 Doppler Pressure: 90 Automatic BP: 99/66 (77) O2 Sat: 99% RA Wt: 187.4>174.8>178>178.7>184.3>179>181.4>185.4>181>182.5>183.6  lbs   LVAD interrogation reveals:  Speed: 5700 Flow: 4.5 Power: 4.4w PI: 3.4  Alarms: none  Events: 65 today; 45 yesterday Hematocrit: 29  Fixed speed:  5700 Low speed limit: 5400  Drive Line/sternal wound: CDI. Wound vac WNL.  Labs:  LDH trend: 163>193>159>130>136>133>134>143>136>143>153>151>146>156  INR trend: 3.2>2.2>2.7........1.1>1.3>1.7>1.5>1.5  Anticoagulation Plan: -INR Goal: 2.0 - 2.5 -ASA Dose: 81 mg daily -Heparin 850 u/hr -Coumadin restarted 3/15  Device: N/A  Infection:  - 01/25/20 BCs>>negative - 01/25/20 sternal wound culture>>MSSA - 01/26/20 intra-op sternal wound cultures>>MSSA  Plan/Recommendations:  1. Call VAD Coordinator if any VAD or drive line issues. 2. Per Boyce Medici, PA, will repeat INR at 13:00 today. 3. Discharge patient home when INR therapeutic.    Hessie Diener RN VAD Coordinator  Office: [phone]  24/7 Pager: [phone]

## 2020-02-08 NOTE — Progress Notes (Addendum)
Patient ID: Theodore Welch, male   DOB: Apr 12, 1961, 59 y.o.   MRN: [id number] P    Advanced Heart Failure Rounding Note  PCP-Cardiologist: No primary care provider on file.   Subjective:    To OR 3/10, lower substernal abscess I&D, did not visualize LVAD hardware but able to palpate outflow graft.  Driveline site also with I&D.  Wound vacs placed lower sternal site and driveline site, now removed due to recurrent occlusion and wounds dressed. Back to OR 3/15 for repeat I&D, 2 wound vacs again placed.  To OR 3/19 to change the wound vac.   WBCs normal. Afebrile. No sternal/abdominal pain.  ID has seen.  Now on cefazolin. Wound culture with MSSA, blood cultures negative. PICC placed for home abx.   CT abdomen: subxiphoid fluid collection with superficial and deep components, collection is along the course of the driveline. Cellulitis at surface, no evidence for sternal osteomyelitis.   MAPs improved 70s-80s  INR 1.5 today    Continues w/ slight HA. Head CT negative.    LVAD: Heartmate 3. Flow 4.9 L/min, 5750 rpm, PI 3.3  power 4.4. Multiple events but no low flows.  LDH stable at 156  Objective:   Weight Range: 83.3 kg Body mass index is 25.62 kg/m.   Vital Signs:   Temp:  [97.6 F (36.4 C)-98.9 F (37.2 C)] 98.9 F (37.2 C) (03/23 0702) Pulse Rate:  [74-94] 94 (03/23 0702) Resp:  [12-23] 14 (03/23 0702) BP: (92-104)/(66-87) 99/66 (03/23 0817) SpO2:  [98 %-99 %] 99 % (03/23 0702) Weight:  [83.3 kg] 83.3 kg (03/23 0341) Last BM Date: 02/06/20  Weight change: Filed Weights   02/06/20 0500 02/07/20 0444 02/08/20 0341  Weight: 84.7 kg 82.8 kg 83.3 kg    Intake/Output:   Intake/Output Summary (Last 24 hours) at 02/08/2020 0842 Last data filed at 02/08/2020 0721 Gross per 24 hour  Intake 1489.15 ml  Output 2025 ml  Net -535.85 ml      Physical Exam    PHYSICAL EXAM: General:  Well appearing. No respiratory difficulty HEENT: normal Neck: supple. no JVD. Carotids 2+  bilat; no bruits. No lymphadenopathy or thyromegaly appreciated. Cor: + LVAD HUM  Lungs: CTAB Abdomen: soft, nontender, nondistended. No hepatosplenomegaly. No bruits or masses. Good bowel sounds. LVAD Driveline Site: covered w/ wound vac Extremities: no cyanosis, clubbing, rash, edema Neuro: alert & oriented x 3, cranial nerves grossly intact. moves all 4 extremities w/o difficulty. Affect pleasant.   Telemetry   NSR 80s, personally reviewed.    Labs    CBC Recent Labs    02/07/20 0437 02/08/20 0357  WBC 7.6 7.4  HGB 9.2* 9.5*  HCT 29.1* 29.9*  MCV 96.4 95.8  PLT 347 331   Basic Metabolic Panel Recent Labs    40/98/11 0437 02/08/20 0357  NA 141 141  K 3.9 4.1  CL 105 107  CO2 26 27  GLUCOSE 97 102*  BUN 6 6  CREATININE 0.84 0.83  CALCIUM 9.0 9.2   Liver Function Tests No results for input(s): AST, ALT, ALKPHOS, BILITOT, PROT, ALBUMIN in the last 72 hours. No results for input(s): LIPASE, AMYLASE in the last 72 hours. Cardiac Enzymes No results for input(s): CKTOTAL, CKMB, CKMBINDEX, TROPONINI in the last 72 hours.  BNP: BNP (last 3 results) No results for input(s): BNP in the last 8760 hours.  ProBNP (last 3 results) No results for input(s): PROBNP in the last 8760 hours.   D-Dimer No results for input(s): DDIMER  in the last 72 hours. Hemoglobin A1C No results for input(s): HGBA1C in the last 72 hours. Fasting Lipid Panel No results for input(s): CHOL, HDL, LDLCALC, TRIG, CHOLHDL, LDLDIRECT in the last 72 hours. Thyroid Function Tests No results for input(s): TSH, T4TOTAL, T3FREE, THYROIDAB in the last 72 hours.  Invalid input(s): FREET3  Other results:   Imaging    No results found.   Medications:     Scheduled Medications: . sodium chloride   Intravenous Once  . amLODipine  10 mg Oral Daily  . aspirin EC  81 mg Oral Daily  . bisacodyl  10 mg Oral Daily  . DULoxetine  60 mg Oral Daily  . fluticasone  1 spray Each Nare Daily  .  gabapentin  600 mg Oral TID  . hydrALAZINE  50 mg Oral Q8H  . nicotine  14 mg Transdermal Daily  . pantoprazole  40 mg Oral Daily  . sacubitril-valsartan  1 tablet Oral BID  . senna-docusate  1 tablet Oral QHS  . sodium chloride flush  10-40 mL Intracatheter Q12H  . sodium chloride flush  3 mL Intravenous Q12H  . spironolactone  25 mg Oral Daily  . thiamine  100 mg Oral Daily  . vitamin B-12  1,000 mcg Oral Daily  . warfarin  10 mg Oral ONCE-1800  . Warfarin - Pharmacist Dosing Inpatient   Does not apply q1600  . zinc sulfate  220 mg Oral Daily    Infusions: . sodium chloride    . sodium chloride 250 mL (02/07/20 2155)  .  ceFAZolin (ANCEF) IV 2 g (02/08/20 0536)  . heparin 850 Units/hr (02/07/20 2242)    PRN Medications: sodium chloride, sodium chloride, acetaminophen, docusate sodium, fentaNYL (SUBLIMAZE) injection, hydrALAZINE, hydrOXYzine, ipratropium-albuterol, methocarbamol, ondansetron (ZOFRAN) IV, ondansetron (ZOFRAN) IV, ondansetron (ZOFRAN) IV, oxyCODONE, polyethylene glycol, sodium chloride flush, sodium chloride flush, sodium chloride flush, traMADol, zolpidem   Assessment/Plan   1. Subxiphoid abscess: With surrounding cellulitis.  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  CT abdomen showed subxiphoid fluid collection with superficial and deep components, collection is along the course of the driveline. Cellulitis at surface, no evidence for sternal osteomyelitis. S/P I&D 3/10, did not visualize LVAD hardware but able to palpate outflow graft.  Driveline site also with I&D.  Wound vacs placed to lower sternal site and driveline site but now removed with recurrent occlusions.  Now afebrile with WBCs down. MSSA from wound culture. Echo this admission did not show evidence for vegetations but technically difficult. S/P repeat I&D 3/15, 2 wound vacs placed. Developed air leak 3/18 that was fixed. Back to OR 3/19 to change  wound vac.  Vacs appear to be functioning normally now.  - Continue Cefazolin x 8 wks. PICC placed for home abx - Very concerning infection, especially given prior MSSA driveline infection and already treating with suppressive Keflex. There is a possibility that this could lead to the need for pump exchange, at the least he will need extended IV treatment.  2. 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  Volume status stable. MAP now stable in 70-80s. - Continue spironolactone 25 mg daily.  - Continue Entresto 97-103 twice a day (home dose).      - Continue amlodipine to 10 mg daily.  - Continue hydralazine 50 mg tid.  - Intolerant to Bidil (HAs) - Continue ASA 81 daily. - Warfarin with goal INR 2-2.5. On coumadin,  INR 1.5. Adjust dose today. Discussed w/ pharmacy. Continue heparin until INR 1.8.  - Should be transplant candidate eventually if he can quit smoking.  Will make transplant clinic referral when he is off totally.  3. Smoking: Cutting back.   4. H/o RLE DVT: Has been on warfarin for LVAD.  5. OSA: Continue CPAP.  6. Type II diabetes: SSI while inpatient.   7. Headache: Thinks med-related.  No neurological signs or other symptoms.  Improved off Bidil and atorvastatin. Head CT 3/21 unremarkable.    Length of Stay: [address], PA-C  02/08/2020, 8:42 AM  Advanced Heart Failure Team Pager [phone] (M-F; 7a - 4p)  Please contact [facility] for night-coverage after hours (4p -7a ) and weekends on [email or web address]  Patient seen with PA, agree with the above note.   MAP stable 70s-80s.  INR 1.5.  Still with slight headache but overall better.   General: Well appearing this am. NAD.  HEENT: Normal. Neck: Supple, JVP 7-8 cm. Carotids OK.  Cardiac:  Mechanical heart sounds with LVAD hum present.  Lungs:  CTAB, normal effort.  Abdomen:  NT, ND, no HSM. No bruits or masses. +BS  LVAD exit site: Wound vac in place.  Extremities:  Warm  and dry. No cyanosis, clubbing, rash, or edema.  Neuro:  Alert & oriented x 3. Cranial nerves grossly intact. Moves all 4 extremities w/o difficulty. Affect pleasant    Continue heparin gtt until INR > 1.7.  Hopefully home tomorrow.   Continue current BP meds, MAP stable.   Headache improving, off all meds that could cause and CT head ok.   Marca Ancona 02/08/2020 9:56 AM

## 2020-02-08 NOTE — Discharge Summary (Signed)
Advanced Heart Failure Team  Discharge Summary   Patient ID: Theodore Welch MRN: [id number], DOB/AGE: [birth year] 61 y.o. 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 malewith history of nonischemic cardiomyopathy w/ Heartmate 3 LVAD, h/o RLE DVT, cirrhosis, smoking, and OSA admitted 01/25/20 for subxiphoid abscess.   His Cardiomyopathy was first diagnosed in 6/19 in [name]. LHC/RHC at that time showed low output. He was [facility] in [facility]/19 with low output HF and was started on milrinone and diuresed. Unable to wean off milrinone. He had a degree of RV failure, but this improved on milrinone. Valvular heart disease also looked better with milrinone and diuresis. On 08/03/18, he had Heartmate 3 LVAD placed. Speed was optimized by ramp echo post-op. Post-op course was relatively unremarkable.  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 No abscess on CT. He was started on cefazolin IV for 6 wks, followed by chronic Keflex for MSSA suppression. BP-active meds decreased with low MAPs.   He presented to [facility] on 3/9 w/ CC of new lower sternal area abscess and low grade fever, temp 99. He was directly admitted for surgical debridement. VAD parameters stable on admit. Coumadin held. IV abx initiated. Blood cultures were negative. CT of abdomen showed subxiphoid fluid collection with superficial and deep components, collection is along the course of the driveline. Cellulitis at surface, no evidence for sternal osteomyelitis.  Taken to OR on 3/10 for lower substernal abscess  I&D. Did not visualize LVAD hardware but able to palpate outflow graft.  Driveline site also with I&D.  Wound vacs placed.  Wound culture with MSSA. Back to OR 3/15 for repeat I&D, 2 wound vacs again placed. Returned to OR 3/19 to change the wound vac due to blockage alert.   ID evaluated and recommended IV abx x 8 weeks. Placed on Cefazolin. PICC line placed for continuation of home abx. [place] services arranged. Per ID, after 8 weeks of IV treatment with Cefazolin, will return to oral suppression with cephalexin.  Coumadin was restarted w/ IV heparin bridge. VAD parameters remained stable. HF meds were adjusted. Bidil discontinued due to HAs. Hydralazine was increased to 50 tid. Head CT performed due to persistent HA and unremarkable.     On 3/24, IRN was therapeutic at 2.2. He was seen and examined by Dr. [name] and felt stable for discharge home. [facility] Clinic f/u arranged for 3/26. ID f/u arranged 4/14.     Discharge Weight Range: 182 lb Discharge Vitals: Blood pressure 106/85, pulse 83, temperature 98.2 F (36.8 C), temperature source Oral, resp. rate 18, height 5' 10.98" (1.803 m), weight 82.8 kg, SpO2 100 %.  Labs: Lab Results  Component Value Date   WBC 6.5 02/09/2020   HGB 9.3 (L) 02/09/2020   HCT 29.6 (L) 02/09/2020   MCV 95.5 02/09/2020   PLT 266 02/09/2020    Recent Labs  Lab 02/09/20 0500  NA 140  K 3.9  CL 105  CO2 26  BUN <5*  CREATININE 0.75  CALCIUM 9.2  GLUCOSE 110*   Lab Results  Component Value Date   CHOL 97 07/21/2018   HDL  32 (L) 07/21/2018   LDLCALC 57 07/21/2018   TRIG 39 07/21/2018   BNP (last 3 results) No results for input(s): BNP in the last 8760 hours.  ProBNP (last 3 results) No results for input(s): PROBNP in the last 8760 hours.   Diagnostic Studies/Procedures   No results found.  Discharge Medications   Allergies as of 02/09/2020      Reactions   Chlorhexidine Rash   Other Rash   Prep pads      Medication List    STOP taking  these medications   atorvastatin 20 MG tablet Commonly known as: LIPITOR   cephALEXin 500 MG capsule Commonly known as: KEFLEX   omeprazole 20 MG capsule Commonly known as: PRILOSEC Replaced by: pantoprazole 40 MG tablet     TAKE these medications   amLODipine 5 MG tablet Commonly known as: NORVASC Take 2 tablets (10 mg total) by mouth daily. What changed: when to take this   aspirin EC 81 MG tablet Take 81 mg by mouth daily.   carbamide peroxide 6.5 % OTIC solution Commonly known as: DEBROX Place 5 drops into both ears 3 (three) times daily. What changed: when to take this   ceFAZolin  IVPB Commonly known as: ANCEF Inject 2 g into the vein every 8 (eight) hours. Indication:  MSSA LVAD infection Last Day of Therapy:  03/27/2020 Labs - Once weekly:  CBC/D and BMP, Labs - Every other week:  ESR and CRP   Chantix Continuing Month Pak 1 MG tablet Generic drug: varenicline Take 1 mg by mouth 2 (two) times daily.   docusate sodium 100 MG capsule Commonly known as: COLACE Take 2 capsules (200 mg total) by mouth daily as needed for mild constipation.   fluticasone 50 MCG/ACT nasal spray Commonly known as: FLONASE Place 1 spray into both nostrils 2 (two) times daily as needed for allergies.   freestyle lancets Use as instructed   gabapentin 300 MG capsule Commonly known as: NEURONTIN Take 600 mg by mouth 2 (two) times daily as needed (nerve pain).   glucose blood test strip Commonly known as: Contour Next Test Use as instructed   hydrALAZINE 50 MG tablet Commonly known as: APRESOLINE Take 1 tablet (50 mg total) by mouth 3 (three) times daily.   Ipratropium-Albuterol 20-100 MCG/ACT Aers respimat Commonly known as: COMBIVENT Inhale 1 puff into the lungs every 6 (six) hours as needed for wheezing or shortness of breath.   nystatin powder Commonly known as: MYCOSTATIN/NYSTOP Apply 1 application topically 3 (three) times daily. What changed: additional  instructions   oxyCODONE 5 MG immediate release tablet Commonly known as: Oxy IR/ROXICODONE Take 1 tablet (5 mg total) by mouth every 4 (four) hours as needed for moderate pain (Take 1 tablet prior to [facility] Appointment on 3/26).   pantoprazole 40 MG tablet Commonly known as: PROTONIX Take 1 tablet (40 mg total) by mouth daily. Replaces: omeprazole 20 MG capsule   sacubitril-valsartan 97-103 MG Commonly known as: ENTRESTO Take 1 tablet by mouth 2 (two) times daily.   sildenafil 100 MG tablet Commonly known as: VIAGRA Take 50 mg by mouth daily as needed for erectile dysfunction.   spironolactone 25 MG tablet Commonly known as: ALDACTONE Take 1 tablet (25 mg total) by mouth daily.   thiamine 100 MG tablet Take 1 tablet (100 mg total) by mouth daily.   traMADol 50 MG tablet Commonly known as: ULTRAM Take 1 tablet (50 mg total) by mouth every 6 (six) hours as needed for  severe pain.   vitamin B-12 500 MCG tablet Commonly known as: CYANOCOBALAMIN Take 1,000 mcg by mouth daily.   warfarin 5 MG tablet Commonly known as: COUMADIN Take as directed. If you are unsure how to take this medication, talk to your nurse or doctor. Original instructions: TAKE 1 TABLET BY MOUTH DAILY EXCEPT 1 AND 1/2 TABLETS ON MONDAY, WEDNESDAY AND FRIDAY AS DIRECTED What changed: See the new instructions.   Zinc Gluconate 100 MG Tabs Take 1 tablet (100 mg total) by mouth daily. What changed: when to take this   zolpidem 5 MG tablet Commonly known as: Ambien Take 1 tablet (5 mg total) by mouth at bedtime.            Home Infusion Instuctions  (From admission, onward)         Start     Ordered   02/09/20 0000  Home infusion instructions    Question:  Instructions  Answer:  Flushing of vascular access device: 0.9% NaCl pre/post medication administration and prn patency; Heparin 100 u/ml, 76m for implanted ports and Heparin 10u/ml, 56mfor all other central venous catheters.   02/09/20 1031            Durable Medical Equipment  (From admission, onward)         Start     Ordered   02/02/20 0903  Heart failure home health orders  (Heart failure home health orders / Face to face)  Once    Comments: Heart Failure Follow-up Care:  Verify follow-up appointments per Patient Discharge Instructions. Confirm transportation arranged. Reconcile home medications with discharge medication list. Remove discontinued medications from use. Assist patient/caregiver to manage medications using pill box. Reinforce low sodium food selection Assessments: Vital signs and oxygen saturation at each visit. Assess home environment for safety concerns, caregiver support and availability of low-sodium foods. Consult SoEducation officer, museumPT/OT, Dietitian, and CNA based on assessments. Perform comprehensive cardiopulmonary assessment. Notify MD for any change in condition or weight gain of 3 pounds in one day or 5 pounds in one week with symptoms. Daily Weights and Symptom Monitoring: Ensure patient has access to scales. Teach patient/caregiver to weigh daily before breakfast and after voiding using same scale and record.    Teach patient/caregiver to track weight and symptoms and when to notify Provider. Activity: Develop individualized activity plan with patient/caregiver.  OPAT Orders Discharge antibiotics: Cefazolin  Per pharmacy protocol  Aim for Vancomycin trough 15-20 or AUC 400-550 (unless otherwise indicated) Duration: 8 weeks  [facility] Date[facility]27/20  [facility]  Home health RN for IV administration and teaching; PICC line care and labs.   Labs weekly while on IV antibiotics: _X_ CBC with differential _X_ BMP __ CMP _X_ CRP _X_ ESR __ Vancomycin trough __ CK  __ Please pull PIC at completion of IV antibiotics _X_ Please leave PIC in place until doctor has seen patient or been notified  Fax weekly labs to [phone] Follow Up Appt:  Question Answer  Comment  Heart Failure Follow-up Care Advanced Heart Failure (AHF) Clinic at [phone] Obtain the following labs Basic Metabolic Panel   Lab frequency Other see comments   Fax lab results to AHF Clinic at [phone] Diet Low Sodium Heart Healthy   Fluid restrictions: See other comments no restrictions      02/02/20 0904          Disposition   The patient will be discharged in stable condition to  home. Discharge Instructions    Home infusion instructions   Complete by: As directed    Instructions: Flushing of vascular access device: 0.9% NaCl pre/post medication administration and prn patency; Heparin 100 u/ml, 35m for implanted ports and Heparin 10u/ml, 575mfor all other central venous catheters.     Follow-up Information    DiRaleigh CallasNP Follow up.   Specialty: Infectious Diseases Why: 03/01/20 at 2:30 pm.  Contact information: [address] [place] [id number].   Why: [id number] Follow up on 02/11/2020.   Specialty: Cardiology Why: [facility] 10:00 AM. Parking Garage Code [phone] information: [address][phone]     [facility] Follow up.   Why: wound vac is in the room             Duration of Discharge Encounter: Greater than 35 minutes   Signed, BrNelida Gores/24/2021, 5:41 PM

## 2020-02-08 NOTE — Progress Notes (Addendum)
ANTICOAGULATION CONSULT NOTE  Pharmacy Consult for Heparin/Coumadin Indication: LVAD  Allergies  Allergen Reactions  . Chlorhexidine Rash  . Other Rash    Prep pads    Patient Measurements: Height: 5' 10.98" (180.3 cm) Weight: 183 lb 10.3 oz (83.3 kg) IBW/kg (Calculated) : 75.26 Heparin Dosing Weight: 85 kg  Vital Signs: Temp: 98.5 F (36.9 C) (03/23 0341) Temp Source: Oral (03/23 0341) BP: 92/70 (03/23 0341) Pulse Rate: 74 (03/23 0341)  Labs: Recent Labs    02/06/20 0435 02/06/20 0435 02/06/20 0936 02/07/20 0437 02/08/20 0357  HGB 8.7*   < >  --  9.2* 9.5*  HCT 27.5*  --   --  29.1* 29.9*  PLT 368  --   --  347 331  LABPROT 19.8*  --   --  18.3* 17.6*  INR 1.7*  --   --  1.5* 1.5*  HEPARINUNFRC  --   --  <0.10* 0.10* 0.11*  CREATININE  --   --   --  0.84 0.83   < > = values in this interval not displayed.    Estimated Creatinine Clearance: 100.8 mL/min (by C-G formula based on SCr of 0.83 mg/dL).   Medical History: Past Medical History:  Diagnosis Date  . 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 Cardiomyopathy, unspecified (HCC)   . CHF (congestive heart failure) (HCC)   . Chronic back pain   . Diabetes mellitus without complication (HCC)   . Enlarged heart   . Gastric ulcer   . Gastroenteritis   . H/O degenerative disc disease   . Hypertension   . LVAD (left ventricular assist device) present [facility])     Assessment: 58yom presenting with concern of large sternal abscess- starting on antibiotics. On warfarin PTA for hx LVAD (goal 2-2.5).  PTA regimen is warfarin 5 mg daily except 7.5 mg TTS.   S/p I&D/driveline exploration in OR 3/15. No bleeding complications have been noted since. Hgb stable ~ 9.5.  Pltc ok LDH stable 156. Patient having wound vac alarms.   Heparin level low (0.11) as expected on low dose heparin, previously increased to 850 units/hr by surgery. INR 1.5 - s/p lower dose of warfarin 3/21 and pt ate a lot of collard greens with  dinner.  Goal of Therapy:  Heparin level <0.3 INR 2-2.5 Monitor platelets by anticoagulation protocol: Yes   Plan:  IV heparin continued at 850 units/hr per surgery Check heparin level daily to ensure not > 0.3 and daily CBC. Warfarin 10 mg x 1 again today INR daily  Jenetta Downer, [facility] Clinical Pharmacist Phone [phone]  02/08/2020 7:26 AM    Please check [email or web address] for unit-specific pharmacist phone numbers

## 2020-02-09 LAB — BASIC METABOLIC PANEL
Anion gap: 9 (ref 5–15)
BUN: <5 mg/dL — ABNORMAL LOW (ref 6–20)
CO2: 26 mmol/L (ref 22–32)
Calcium: 9.2 mg/dL (ref 8.9–10.3)
Chloride: 105 mmol/L (ref 98–111)
Creatinine, Ser: 0.75 mg/dL (ref 0.61–1.24)
GFR calc Af Amer: >60 mL/min (ref >60)
GFR calc non Af Amer: >60 mL/min (ref >60)
Glucose, Bld: 110 mg/dL — ABNORMAL HIGH (ref 70–99)
Potassium: 3.9 mmol/L (ref 3.5–5.1)
Sodium: 140 mmol/L (ref 135–145)

## 2020-02-09 LAB — CBC
HCT: 29.6 % — ABNORMAL LOW (ref 39.0–52.0)
Hemoglobin: 9.3 g/dL — ABNORMAL LOW (ref 13.0–17.0)
MCH: 30 pg (ref 26.0–34.0)
MCHC: 31.4 g/dL (ref 30.0–36.0)
MCV: 95.5 fL (ref 80.0–100.0)
Platelets: 266 10*3/uL (ref 150–400)
RBC: 3.1 MIL/uL — ABNORMAL LOW (ref 4.22–5.81)
RDW: 14.3 % (ref 11.5–15.5)
WBC: 6.5 10*3/uL (ref 4.0–10.5)
nRBC: 0 % (ref 0.0–0.2)

## 2020-02-09 LAB — PROTIME-INR
INR: 2 — ABNORMAL HIGH (ref 0.8–1.2)
Prothrombin Time: 22.5 seconds — ABNORMAL HIGH (ref 11.4–15.2)

## 2020-02-09 LAB — HEPARIN LEVEL (UNFRACTIONATED): Heparin Unfractionated: 0.1 IU/mL — ABNORMAL LOW (ref 0.30–0.70)

## 2020-02-09 LAB — LACTATE DEHYDROGENASE: LDH: 161 U/L (ref 98–192)

## 2020-02-09 MED ORDER — SPIRONOLACTONE 25 MG PO TABS: 25.0000 mg | ORAL_TABLET | Freq: Every day | ORAL | 5 refills | Status: DC

## 2020-02-09 MED ORDER — CEFAZOLIN IV (FOR PTA / DISCHARGE USE ONLY): 2.0000 g | Freq: Three times a day (TID) | INTRAVENOUS | 0 refills | Status: DC

## 2020-02-09 MED ORDER — OXYCODONE HCL 5 MG PO TABS: 5.0000 mg | ORAL_TABLET | ORAL | 0 refills | Status: DC | PRN

## 2020-02-09 MED ORDER — LIDOCAINE HCL (PF) 1 % IJ SOLN
INTRAMUSCULAR | Status: AC
  Filled 2020-02-09: qty 5

## 2020-02-09 MED ORDER — HYDRALAZINE HCL 50 MG PO TABS: 50.0000 mg | ORAL_TABLET | Freq: Three times a day (TID) | ORAL | 5 refills | Status: DC

## 2020-02-09 MED ORDER — PANTOPRAZOLE SODIUM 40 MG PO TBEC: 40.0000 mg | DELAYED_RELEASE_TABLET | Freq: Every day | ORAL | 2 refills | Status: DC

## 2020-02-09 NOTE — Discharge Instructions (Signed)
Information on my medicine - Coumadin®   (Warfarin) ° °This medication education was reviewed with me or my healthcare representative as part of my discharge preparation.   °Why was Coumadin prescribed for you? °Coumadin was prescribed for you because you have a blood clot or a medical condition that can cause an increased risk of forming blood clots. Blood clots can cause serious health problems by blocking the flow of blood to the heart, lung, or brain. Coumadin can prevent harmful blood clots from forming. °As a reminder your indication for Coumadin is:   Blood Clot Prevention After Heart Pump Surgery ° °What test will check on my response to Coumadin? °While on Coumadin (warfarin) you will need to have an INR test regularly to ensure that your dose is keeping you in the desired range. The INR (international normalized ratio) number is calculated from the result of the laboratory test called prothrombin time (PT). ° °If an INR APPOINTMENT HAS NOT ALREADY BEEN MADE FOR YOU please schedule an appointment to have this lab work done by your health care provider within 7 days. °Your INR goal is a number between:  2 to 2.5. ° °What  do you need to  know  About  COUMADIN? °Take Coumadin (warfarin) exactly as prescribed by your healthcare provider about the same time each day.  DO NOT stop taking without talking to the doctor who prescribed the medication.  Stopping without other blood clot prevention medication to take the place of Coumadin may increase your risk of developing a new clot or stroke.  Get refills before you run out. ° °What do you do if you miss a dose? °If you miss a dose, take it as soon as you remember on the same day then continue your regularly scheduled regimen the next day.  Do not take two doses of Coumadin at the same time. ° °Important Safety Information °A possible side effect of Coumadin (Warfarin) is an increased risk of bleeding. You should call your healthcare provider right away if you  experience any of the following: °? Bleeding from an injury or your nose that does not stop. °? Unusual colored urine (red or dark brown) or unusual colored stools (red or black). °? Unusual bruising for unknown reasons. °? A serious fall or if you hit your head (even if there is no bleeding). ° °Some foods or medicines interact with Coumadin® (warfarin) and might alter your response to warfarin. To help avoid this: °? Eat a balanced diet, maintaining a consistent amount of Vitamin K. °? Notify your provider about major diet changes you plan to make. °? Avoid alcohol or limit your intake to 1 drink for women and 2 drinks for men per day. °(1 drink is 5 oz. wine, 12 oz. beer, or 1.5 oz. liquor.) ° °Make sure that ANY health care provider who prescribes medication for you knows that you are taking Coumadin (warfarin).  Also make sure the healthcare provider who is monitoring your Coumadin knows when you have started a new medication including herbals and non-prescription products. ° °Coumadin® (Warfarin)  Major Drug Interactions  °Increased Warfarin Effect Decreased Warfarin Effect  °Alcohol (large quantities) °Antibiotics (esp. Septra/Bactrim, Flagyl, Cipro) °Amiodarone (Cordarone) °Aspirin (ASA) °Cimetidine (Tagamet) °Megestrol (Megace) °NSAIDs (ibuprofen, naproxen, etc.) °Piroxicam (Feldene) °Propafenone (Rythmol SR) °Propranolol (Inderal) °Isoniazid (INH) °Posaconazole (Noxafil) Barbiturates (Phenobarbital) °Carbamazepine (Tegretol) °Chlordiazepoxide (Librium) °Cholestyramine (Questran) °Griseofulvin °Oral Contraceptives °Rifampin °Sucralfate (Carafate) °Vitamin K  ° °Coumadin® (Warfarin) Major Herbal Interactions  °Increased Warfarin Effect Decreased Warfarin Effect  °  Garlic °Ginseng °Ginkgo biloba Coenzyme Q10 °Green tea °St. John’s wort   ° °Coumadin® (Warfarin) FOOD Interactions  °Eat a consistent number of servings per week of foods HIGH in Vitamin K °(1 serving = ½ cup)  °Collards (cooked, or boiled &  drained) °Kale (cooked, or boiled & drained) °Mustard greens (cooked, or boiled & drained) °Parsley *serving size only = ¼ cup °Spinach (cooked, or boiled & drained) °Swiss chard (cooked, or boiled & drained) °Turnip greens (cooked, or boiled & drained)  °Eat a consistent number of servings per week of foods MEDIUM-HIGH in Vitamin K °(1 serving = 1 cup)  °Asparagus (cooked, or boiled & drained) °Broccoli (cooked, boiled & drained, or raw & chopped) °Brussel sprouts (cooked, or boiled & drained) *serving size only = ½ cup °Lettuce, raw (green leaf, endive, romaine) °Spinach, raw °Turnip greens, raw & chopped  ° °These websites have more information on Coumadin (warfarin):  www.coumadin.com; °www.ahrq.gov/consumer/coumadin.htm; ° ° ° °

## 2020-02-09 NOTE — Progress Notes (Signed)
Patient is not wearing the CPAP at this time.

## 2020-02-09 NOTE — Progress Notes (Signed)
Wound vac changed to home vac system. No complications, utilized the Y connection previously set up by physician in OR.  good suction noted with green lights. Suction pre set at . Verifying suction rate with CVTS team as pt was put to - on hospital wound vac pump.

## 2020-02-09 NOTE — Progress Notes (Signed)
ANTICOAGULATION CONSULT NOTE  Pharmacy Consult for Heparin/Coumadin Indication: LVAD  Allergies  Allergen Reactions  . Chlorhexidine Rash  . Other Rash    Prep pads    Patient Measurements: Height: 5' 10.98" (180.3 cm) Weight: 182 lb 8.7 oz (82.8 kg) IBW/kg (Calculated) : 75.26 Heparin Dosing Weight: 85 kg  Vital Signs: Temp: 98.2 F (36.8 C) (03/24 0844) Temp Source: Oral (03/24 0844) BP: 106/85 (03/24 0844) Pulse Rate: 83 (03/24 0844)  Labs: Recent Labs    02/07/20 0437 02/07/20 0437 02/08/20 0357 02/08/20 1300 02/09/20 0500  HGB 9.2*   < > 9.5*  --  9.3*  HCT 29.1*  --  29.9*  --  29.6*  PLT 347  --  331  --  266  LABPROT 18.3*   < > 17.6* 18.7* 22.5*  INR 1.5*   < > 1.5* 1.6* 2.0*  HEPARINUNFRC 0.10*  --  0.11*  --  0.10*  CREATININE 0.84  --  0.83  --  0.75   < > = values in this interval not displayed.    Estimated Creatinine Clearance: 104.6 mL/min (by C-G formula based on SCr of 0.75 mg/dL).   Medical History: Past Medical History:  Diagnosis Date  . 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 Cardiomyopathy, unspecified (HCC)   . CHF (congestive heart failure) (HCC)   . Chronic back pain   . Diabetes mellitus without complication (HCC)   . Enlarged heart   . Gastric ulcer   . Gastroenteritis   . H/O degenerative disc disease   . Hypertension   . LVAD (left ventricular assist device) present [facility])     Assessment: 58yom presenting with concern of large sternal abscess- starting on antibiotics. On warfarin PTA for hx LVAD (goal 2-2.5).  PTA regimen is warfarin 5 mg daily except 7.5 mg TTS.   S/p I&D/driveline exploration in OR 3/15. No bleeding complications have been noted since. Hgb stable ~ 9.5.  Pltc ok LDH stable 156.  Heparin level low  as expected on low dose heparin, previously increased to 850 units/hr by surgery. INR 2 after 2 boosted doses after a bunch of collard greens with dinner this week -  Goal of Therapy:  Heparin level <0.3 INR  2-2.5 Monitor platelets by anticoagulation protocol: Yes   Plan:  Stop heparin  Warfarin 7.5mg  TTSa and 5mg  all other days - per home dose  INR next week per LVAD team  Pharm.D. CPP, BCPS Clinical Pharmacist [phone] 02/09/2020 11:03 AM       Please check [email or web address] for unit-specific pharmacist phone numbers

## 2020-02-09 NOTE — Progress Notes (Signed)
Patient discharging with spouse, teaching given regarding wound vac and home healthcare needs. PICC line dressing changed. IV removed from R.FA clean dry, intact. Discharge instructions including follow up appointments and medication changes given to patient, he understands.

## 2020-02-09 NOTE — Plan of Care (Signed)
  Problem: Clinical Measurements: Goal: Will remain free from infection Outcome: Progressing Goal: Diagnostic test results will improve Outcome: Progressing Goal: Cardiovascular complication will be avoided Outcome: Progressing   Problem: Pain Managment: Goal: General experience of comfort will improve Outcome: Progressing   Problem: Safety: Goal: Ability to remain free from injury will improve Outcome: Progressing   

## 2020-02-09 NOTE — Progress Notes (Signed)
LVAD Coordinator Rounding Note:  Admitted 01/25/20 per Dr. Shirlee Latch with new onset sternal abscess.   HM III LVAD implanted on 08/03/18 by Dr. Dr. Laneta Simmers under Destination Therapy criteria due to smoking status.   Pt up in the chair. States he is ready to go home today.  Vital signs: Temp: 98.2 HR: 83 Doppler Pressure: 96 Automatic BP: 106/85 (93) O2 Sat: 100% RA Wt: 187.4>174.8>178>178.7>184.3>179>181.4>185.4>181>182.5>183.6 >182.5 lbs   LVAD interrogation reveals:  Speed: 5700 Flow: 4.7 Power: 4.4w PI: 3.2 Alarms: none  Events: 20 today; 80= yesterday Hematocrit: 29  Fixed speed:  5700 Low speed limit: 5400  Drive Line/sternal wound: CDI. Wound vac WNL.  Labs:  LDH trend: 163>193>159>130>136>133>134>143>136>143>153>151>146>156>161  INR trend: 3.2>2.2>2.7........1.1>1.3>1.7>1.5>1.5>2.0  Anticoagulation Plan: -INR Goal: 2.0 - 2.5 -ASA Dose: 81 mg daily -Heparin 850 u/hr- stopped today -Coumadin restarted 3/15  Device: N/A Infection:  - 01/25/20 BCs>>negative - 01/25/20 sternal wound culture>>MSSA - 01/26/20 intra-op sternal wound cultures>>MSSA  Plan/Recommendations:  1. Call VAD Coordinator if any VAD or drive line issues. 2. Discharge patient home today. Will need pain meds for pre medication prior to clinic appt Friday w/PVT and VAD coordinator. All supplies for wound vac change obtained today along w/Lidocaine. D/w Brittainy for pain meds at d/c. 3. Return to clinic Friday at 1000am for wound vac change.   [name] RN VAD Coordinator  Office: [phone]  24/7 Pager: [phone]

## 2020-02-09 NOTE — TOC Transition Note (Signed)
Transition of Care [facility]) - CM/SW Discharge Note   Patient Details  Name: Theodore Welch MRN: [id number] Date of Birth: 1961-05-26  [facility] CM/SW Contact:  Leone Haven, RN Phone Number: 02/09/2020, 11:24 AM   Clinical Narrative:    Patient is for dc today, Madison Hickman is aware and Lupita Leash with [facility] is aware.  Pam states they will provide the medication for his night time dose today after he gets the 2 pm dose here today. [facility] will see him in the am for the am dose of iv abx.    Final next level of care: Home w Home Health Services Barriers to Discharge: No Barriers Identified   Patient Goals and CMS Choice Patient states their goals for this hospitalization and ongoing recovery are:: get better CMS Medicare.gov Compare Post Acute Care list provided to:: Patient Choice offered to / list presented to : Patient  Discharge Placement                       Discharge Plan and Services   Discharge Planning Services: CM Consult Post Acute Care Choice: Home Health          DME Arranged: Negative pressure wound device DME Agency: [employer] Date DME Agency Contacted: 02/02/20 Time DME Agency Contacted: 1600 Representative spoke with at DME Agency: [facility] HH Arranged: RN, IV Antibiotics HH Agency: [facility] Date HH Agency Contacted: 02/02/20 Time HH Agency Contacted: 1600 Representative spoke with at [facility] Agency: Lupita Leash  Social Determinants of Health (SDOH) Interventions     Readmission Risk Interventions Readmission Risk Prevention Plan 04/05/2019 04/02/2019  Transportation Screening - Complete  PCP or Specialist Appt within 3-5 Days Not Complete -  Not Complete comments earliest appt given with HF/LVAD team -  HRI or Home Care Consult - Complete  Social Work Consult for Recovery Care Planning/Counseling - Complete  Palliative Care Screening - Not Applicable  Medication Review Oceanographer) - Complete  Some recent data might  be hidden

## 2020-02-09 NOTE — Progress Notes (Signed)
5 Days Post-Op Procedure(s) (LRB): STERNAL WOUND DEBRIDEMENT WITH WOUND VAC CHANGE (N/A) Subjective: Wound VAC sponges compressed and nonobstructed Minimal serosanguineous drainage in pump  chamber INR is therapeutic and IV heparin stopped Patient will transition to home care with follow-up in the VAD clinic for wound VAC change in 48 hours. Objective: Vital signs in last 24 hours: Temp:  [97.6 F (36.4 C)-98.5 F (36.9 C)] 98.2 F (36.8 C) (03/24 0844) Pulse Rate:  [80-90] 83 (03/24 0844) Cardiac Rhythm: Normal sinus rhythm;Other (Comment) (03/24 0700) Resp:  [18-20] 18 (03/24 0844) BP: (92-112)/(63-89) 106/85 (03/24 0844) SpO2:  [98 %-100 %] 100 % (03/24 0844) Weight:  [82.8 kg] 82.8 kg (03/24 0500)  Hemodynamic parameters for last 24 hours:    Intake/Output from previous day: 03/23 0701 - 03/24 0700 In: 1550.1 [P.O.:960; I.V.:265.7; IV Piggyback:324.4] Out: 4145 [Urine:4145] Intake/Output this shift: Total I/O In: -  Out: 650 [Urine:650]  Exam  Lungs clear Normal VAD hum Neuro intact Extremities warm No abdominal pain or tenderness Wound VAC sponges compressed.  Lab Results: Recent Labs    02/08/20 0357 02/09/20 0500  WBC 7.4 6.5  HGB 9.5* 9.3*  HCT 29.9* 29.6*  PLT 331 266   BMET:  Recent Labs    02/08/20 0357 02/09/20 0500  NA 141 140  K 4.1 3.9  CL 107 105  CO2 27 26  GLUCOSE 102* 110*  BUN 6 <5*  CREATININE 0.83 0.75  CALCIUM 9.2 9.2    PT/INR:  Recent Labs    02/09/20 0500  LABPROT 22.5*  INR 2.0*   ABG    Component Value Date/Time   PHART 7.625 (HH) 01/25/2020 2108   HCO3 23.6 01/25/2020 2108   TCO2 26 08/04/2018 1622   O2SAT 97.9 01/25/2020 2108   CBG (last 3)  No results for input(s): GLUCAP in the last 72 hours.  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 Discharge instructions reviewed with patient regarding wound VAC and follow-up care.  LOS: 15 days    Theodore Welch 02/09/2020

## 2020-02-09 NOTE — Progress Notes (Addendum)
Patient ID: Theodore Welch, male   DOB: 1961-06-01, 59 y.o.   MRN: [id number] P    Advanced Heart Failure Rounding Note  PCP-Cardiologist: No primary care provider on file.   Subjective:    To OR 3/10, lower substernal abscess I&D, did not visualize LVAD hardware but able to palpate outflow graft.  Driveline site also with I&D.  Wound vacs placed lower sternal site and driveline site, now removed due to recurrent occlusion and wounds dressed. Back to OR 3/15 for repeat I&D, 2 wound vacs again placed.  To OR 3/19 to change the wound vac.   WBCs normal. Afebrile. No sternal/abdominal pain.  ID has seen.  Now on cefazolin. Wound culture with MSSA, blood cultures negative. PICC placed for home abx.   CT abdomen: subxiphoid fluid collection with superficial and deep components, collection is along the course of the driveline. Cellulitis at surface, no evidence for sternal osteomyelitis.   INR 2.0 today. No complaints today. Reading to go home.   LVAD: Heartmate 3. Flow 4.97L/min, 5700 rpm, PI 3.2  power 4.4. LDH stable at 161  Objective:   Weight Range: 82.8 kg Body mass index is 25.47 kg/m.   Vital Signs:   Temp:  [97.6 F (36.4 C)-98.5 F (36.9 C)] 98.2 F (36.8 C) (03/24 0844) Pulse Rate:  [80-90] 83 (03/24 0844) Resp:  [18-23] 18 (03/24 0844) BP: (92-112)/(63-89) 106/85 (03/24 0844) SpO2:  [98 %-100 %] 100 % (03/24 0844) Weight:  [82.8 kg] 82.8 kg (03/24 0500) Last BM Date: 02/08/20  Weight change: Filed Weights   02/07/20 0444 02/08/20 0341 02/09/20 0500  Weight: 82.8 kg 83.3 kg 82.8 kg    Intake/Output:   Intake/Output Summary (Last 24 hours) at 02/09/2020 1009 Last data filed at 02/09/2020 0847 Gross per 24 hour  Intake 1310.14 ml  Output 3870 ml  Net -2559.86 ml      Physical Exam    PHYSICAL EXAM: General:  Well appearing. OOB sitting in chair. No respiratory difficulty HEENT: normal Neck: supple. no JVD. Carotids 2+ bilat; no bruits. No lymphadenopathy or  thyromegaly appreciated. Cor: + LVAD HUM  Lungs: CTAB, no wheezing  Abdomen: soft, nontender, nondistended. No hepatosplenomegaly. No bruits or masses. Good bowel sounds. LVAD Driveline Site: covered w/ wound vac Extremities: no cyanosis, clubbing, rash, edema Neuro: alert & oriented x 3, cranial nerves grossly intact. moves all 4 extremities w/o difficulty. Affect pleasant.   Telemetry   NSR 80s, 4 beats NSVT personally reviewed.    Labs    CBC Recent Labs    02/08/20 0357 02/09/20 0500  WBC 7.4 6.5  HGB 9.5* 9.3*  HCT 29.9* 29.6*  MCV 95.8 95.5  PLT 331 266   Basic Metabolic Panel Recent Labs    31/49/70 0357 02/09/20 0500  NA 141 140  K 4.1 3.9  CL 107 105  CO2 27 26  GLUCOSE 102* 110*  BUN 6 <5*  CREATININE 0.83 0.75  CALCIUM 9.2 9.2   Liver Function Tests No results for input(s): AST, ALT, ALKPHOS, BILITOT, PROT, ALBUMIN in the last 72 hours. No results for input(s): LIPASE, AMYLASE in the last 72 hours. Cardiac Enzymes No results for input(s): CKTOTAL, CKMB, CKMBINDEX, TROPONINI in the last 72 hours.  BNP: BNP (last 3 results) No results for input(s): BNP in the last 8760 hours.  ProBNP (last 3 results) No results for input(s): PROBNP in the last 8760 hours.   D-Dimer No results for input(s): DDIMER in the last 72 hours. Hemoglobin  A1C No results for input(s): HGBA1C in the last 72 hours. Fasting Lipid Panel No results for input(s): CHOL, HDL, LDLCALC, TRIG, CHOLHDL, LDLDIRECT in the last 72 hours. Thyroid Function Tests No results for input(s): TSH, T4TOTAL, T3FREE, THYROIDAB in the last 72 hours.  Invalid input(s): FREET3  Other results:   Imaging    No results found.   Medications:     Scheduled Medications: . sodium chloride   Intravenous Once  . amLODipine  10 mg Oral Daily  . aspirin EC  81 mg Oral Daily  . bisacodyl  10 mg Oral Daily  . DULoxetine  60 mg Oral Daily  . fluticasone  1 spray Each Nare Daily  . gabapentin   600 mg Oral TID  . hydrALAZINE  50 mg Oral Q8H  . lidocaine (PF)      . nicotine  14 mg Transdermal Daily  . pantoprazole  40 mg Oral Daily  . sacubitril-valsartan  1 tablet Oral BID  . senna-docusate  1 tablet Oral QHS  . sodium chloride flush  10-40 mL Intracatheter Q12H  . sodium chloride flush  3 mL Intravenous Q12H  . spironolactone  25 mg Oral Daily  . thiamine  100 mg Oral Daily  . vitamin B-12  1,000 mcg Oral Daily  . Warfarin - Pharmacist Dosing Inpatient   Does not apply q1600  . zinc sulfate  220 mg Oral Daily    Infusions: . sodium chloride    . sodium chloride 10 mL/hr at 02/09/20 0600  .  ceFAZolin (ANCEF) IV Stopped (02/09/20 0536)  . heparin 850 Units/hr (02/09/20 0600)    PRN Medications: sodium chloride, sodium chloride, acetaminophen, docusate sodium, fentaNYL (SUBLIMAZE) injection, hydrALAZINE, hydrOXYzine, ipratropium-albuterol, methocarbamol, ondansetron (ZOFRAN) IV, ondansetron (ZOFRAN) IV, ondansetron (ZOFRAN) IV, oxyCODONE, polyethylene glycol, sodium chloride flush, sodium chloride flush, sodium chloride flush, traMADol, zolpidem   Assessment/Plan   1. Subxiphoid abscess: With surrounding cellulitis.  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  CT abdomen showed subxiphoid fluid collection with superficial and deep components, collection is along the course of the driveline. Cellulitis at surface, no evidence for sternal osteomyelitis. S/P I&D 3/10, did not visualize LVAD hardware but able to palpate outflow graft.  Driveline site also with I&D.  Wound vacs placed to lower sternal site and driveline site but now removed with recurrent occlusions.  Now afebrile with WBCs down. MSSA from wound culture. Echo this admission did not show evidence for vegetations but technically difficult. S/P repeat I&D 3/15, 2 wound vacs placed. Developed air leak 3/18 that was fixed. Back to OR 3/19 to change wound vac.  Vacs  appear to be functioning normally now.  - Continue Cefazolin x 8 wks. PICC placed for home abx - Very concerning infection, especially given prior MSSA driveline infection and already treating with suppressive Keflex. There is a possibility that this could lead to the need for pump exchange, at the least he will need extended IV treatment.  2. 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  Volume status stable. MAPs stable - Continue spironolactone 25 mg daily.  - Continue Entresto 97-103 twice a day (home dose).      - Continue amlodipine to 10 mg daily.  - Continue hydralazine 50 mg tid.  - Intolerant to Bidil (HAs) - Continue ASA 81 daily. - Warfarin with goal INR 2-2.5. On coumadin, INR 2.0 today. Stop IV heparin.Will discuss home dosing  w/ pharmacy.  - Should be transplant candidate eventually if he can quit smoking.  Will make transplant clinic referral when he is off totally.  3. Smoking: Cutting back.   4. H/o RLE DVT: Has been on warfarin for LVAD.  5. OSA: Continue CPAP.  6. Type II diabetes: SSI while inpatient. Continue home regimen on d/c 7. Headache: Thinks med-related.  No neurological signs or other symptoms.  Improved off Bidil and atorvastatin. Head CT 3/21 unremarkable.   Stable for d/c home today. He has close VAD clinic f/u on Friday, 3/26. Dr. Prescott Gum to change wound vac at f/u. He will need pain meds for pre medication prior to clinic appt Friday, per Dr. Prescott Gum. Will give Rx for oxycodone. Home Health Services in place.   Length of Stay: [address], PA-C  02/09/2020, 10:09 AM  Advanced Heart Failure Team Pager [phone] (M-F; 7a - 4p)  Please contact [facility] for night-coverage after hours (4p -7a ) and weekends on [email or web address]  Patient seen with PA, agree with the above note.    INR now therapeutic, will stop heparin gtt.  He can go home today.  He will need followup in CHF clinic and will have followup  with Dr. Prescott Gum for wound vac.  Headache better.  Continue current BP med regimen.  Continue cefazolin x 8 wks total.   Loralie Champagne 02/09/2020 10:22 AM

## 2020-02-10 ENCOUNTER — Other Ambulatory Visit: Payer: Self-pay

## 2020-02-10 ENCOUNTER — Ambulatory Visit (HOSPITAL_COMMUNITY)
Admission: RE | Admit: 2020-02-10 | Discharge: 2020-02-10 | Disposition: A | Discharge status: Home / Self Care | Payer: No Typology Code available for payment source | Source: Ambulatory Visit | Attending: Adult Health | Admitting: Adult Health

## 2020-02-10 VITALS — BP 93/63 | HR 66 | Temp 99.8°F

## 2020-02-10 DIAGNOSIS — B029 Zoster without complications: Secondary | ICD-10-CM | POA: Diagnosis not present

## 2020-02-10 DIAGNOSIS — B027 Disseminated zoster: Secondary | ICD-10-CM | POA: Diagnosis not present

## 2020-02-10 DIAGNOSIS — T827XXA Infection and inflammatory reaction due to other cardiac and vascular devices, implants and grafts, initial encounter: Secondary | ICD-10-CM

## 2020-02-10 NOTE — Progress Notes (Signed)
CSW received request to provide patient with information on covid vaccine sites. CSW provided information on both the super site and the [facility] site at the [facility]. Patient grateful for the information and will follow up and return call if needed. Lasandra Beech, LCSW, CCSW-MCS [phone]

## 2020-02-10 NOTE — Progress Notes (Addendum)
Wound care note:  Vital Signs:  Temp:  99.8 Automatc BP: 93/63 (79) HR: 66 SPO2: 98% RA  Chest wound: Wound vac was removed along with sorbact and all sutures by Dr. Donata Clay. Wound bed not visible due to acell sheet. Wound edges were cleansed with betadine, wound bed measures 6.5 cm x 2 cm. acell placed in the wound bed, topped with saline gauze, sterile gel and mepitel. Wound vac sponge placed over wound bed with wound supplies.   Driveline:  Wound vac was removed along with sorbact and all sutures by Dr. Donata Clay. Wound bed not visible due to acell sheet. Wound edges were cleansed with betadine, wound bed measures 3 cm x 1 cm. approx 1" of velour exposed from driveline. acell placed in the wound bed, topped with saline gauze, sterile gel and mepitel. Wound vac sponge placed over wound bed with wound supplies.   Tubing from both sites place in y-connector and attached to wound vac machine. Suction set at -125. Working properly.  Will f/u with pt next Thursday for wound vac change w/PVT.  [name] RN, BSN VAD Coordinator 24/7 Pager [phone]  Both the lower sternal and driveline exit site wounds are showing incorporation of the ACell product. No purulence or cellulitis noted. Minimal drainage from wound VAC lines. Continue wound VAC therapy another week, then reassess wounds with dressing change in clinic April 1. patient examined and medical record reviewed,agree with above note. [name] 02/10/2020

## 2020-02-10 NOTE — Addendum Note (Signed)
Encounter addended by: Kerin Perna, MD on: 02/10/2020 5:11 PM  Actions taken: Clinical Note Signed

## 2020-02-11 ENCOUNTER — Encounter (HOSPITAL_COMMUNITY): Payer: No Typology Code available for payment source

## 2020-02-11 ENCOUNTER — Other Ambulatory Visit (HOSPITAL_COMMUNITY): Payer: Self-pay | Admitting: Unknown Physician Specialty

## 2020-02-11 DIAGNOSIS — T827XXA Infection and inflammatory reaction due to other cardiac and vascular devices, implants and grafts, initial encounter: Secondary | ICD-10-CM

## 2020-02-11 DIAGNOSIS — Z7901 Long term (current) use of anticoagulants: Secondary | ICD-10-CM

## 2020-02-12 ENCOUNTER — Encounter (HOSPITAL_COMMUNITY): Payer: Self-pay | Admitting: Emergency Medicine

## 2020-02-12 ENCOUNTER — Inpatient Hospital Stay (HOSPITAL_COMMUNITY)
Admission: EM | Admit: 2020-02-12 | Discharge: 2020-02-14 | DRG: 866 | Disposition: A | Discharge status: Home Health | Payer: No Typology Code available for payment source | Attending: Internal Medicine | Admitting: Internal Medicine

## 2020-02-12 ENCOUNTER — Other Ambulatory Visit: Payer: Self-pay

## 2020-02-12 DIAGNOSIS — K5900 Constipation, unspecified: Secondary | ICD-10-CM | POA: Diagnosis present

## 2020-02-12 DIAGNOSIS — E785 Hyperlipidemia, unspecified: Secondary | ICD-10-CM | POA: Diagnosis present

## 2020-02-12 DIAGNOSIS — F17210 Nicotine dependence, cigarettes, uncomplicated: Secondary | ICD-10-CM | POA: Diagnosis present

## 2020-02-12 DIAGNOSIS — Z7982 Long term (current) use of aspirin: Secondary | ICD-10-CM | POA: Diagnosis not present

## 2020-02-12 DIAGNOSIS — Z95828 Presence of other vascular implants and grafts: Secondary | ICD-10-CM | POA: Diagnosis not present

## 2020-02-12 DIAGNOSIS — M797 Fibromyalgia: Secondary | ICD-10-CM | POA: Diagnosis present

## 2020-02-12 DIAGNOSIS — E119 Type 2 diabetes mellitus without complications: Secondary | ICD-10-CM | POA: Diagnosis present

## 2020-02-12 DIAGNOSIS — Z95811 Presence of heart assist device: Secondary | ICD-10-CM

## 2020-02-12 DIAGNOSIS — B027 Disseminated zoster: Principal | ICD-10-CM | POA: Diagnosis present

## 2020-02-12 DIAGNOSIS — B9561 Methicillin susceptible Staphylococcus aureus infection as the cause of diseases classified elsewhere: Secondary | ICD-10-CM | POA: Diagnosis present

## 2020-02-12 DIAGNOSIS — Z7901 Long term (current) use of anticoagulants: Secondary | ICD-10-CM | POA: Diagnosis not present

## 2020-02-12 DIAGNOSIS — Z20822 Contact with and (suspected) exposure to covid-19: Secondary | ICD-10-CM | POA: Diagnosis present

## 2020-02-12 DIAGNOSIS — I428 Other cardiomyopathies: Secondary | ICD-10-CM | POA: Diagnosis present

## 2020-02-12 DIAGNOSIS — Z720 Tobacco use: Secondary | ICD-10-CM | POA: Diagnosis not present

## 2020-02-12 DIAGNOSIS — I110 Hypertensive heart disease with heart failure: Secondary | ICD-10-CM | POA: Diagnosis present

## 2020-02-12 DIAGNOSIS — G4733 Obstructive sleep apnea (adult) (pediatric): Secondary | ICD-10-CM | POA: Diagnosis present

## 2020-02-12 DIAGNOSIS — Z888 Allergy status to other drugs, medicaments and biological substances status: Secondary | ICD-10-CM

## 2020-02-12 DIAGNOSIS — Z86718 Personal history of other venous thrombosis and embolism: Secondary | ICD-10-CM | POA: Diagnosis not present

## 2020-02-12 DIAGNOSIS — Z79899 Other long term (current) drug therapy: Secondary | ICD-10-CM

## 2020-02-12 DIAGNOSIS — Z91048 Other nonmedicinal substance allergy status: Secondary | ICD-10-CM | POA: Diagnosis not present

## 2020-02-12 DIAGNOSIS — K7460 Unspecified cirrhosis of liver: Secondary | ICD-10-CM | POA: Diagnosis not present

## 2020-02-12 DIAGNOSIS — J449 Chronic obstructive pulmonary disease, unspecified: Secondary | ICD-10-CM | POA: Diagnosis present

## 2020-02-12 DIAGNOSIS — T827XXA Infection and inflammatory reaction due to other cardiac and vascular devices, implants and grafts, initial encounter: Secondary | ICD-10-CM | POA: Diagnosis not present

## 2020-02-12 DIAGNOSIS — Z8711 Personal history of peptic ulcer disease: Secondary | ICD-10-CM

## 2020-02-12 DIAGNOSIS — Z8619 Personal history of other infectious and parasitic diseases: Secondary | ICD-10-CM | POA: Diagnosis not present

## 2020-02-12 DIAGNOSIS — L02818 Cutaneous abscess of other sites: Secondary | ICD-10-CM | POA: Diagnosis present

## 2020-02-12 DIAGNOSIS — I5022 Chronic systolic (congestive) heart failure: Secondary | ICD-10-CM | POA: Diagnosis present

## 2020-02-12 DIAGNOSIS — R519 Headache, unspecified: Secondary | ICD-10-CM | POA: Diagnosis not present

## 2020-02-12 DIAGNOSIS — T829XXA Unspecified complication of cardiac and vascular prosthetic device, implant and graft, initial encounter: Secondary | ICD-10-CM | POA: Diagnosis present

## 2020-02-12 DIAGNOSIS — B029 Zoster without complications: Secondary | ICD-10-CM | POA: Diagnosis present

## 2020-02-12 LAB — COMPREHENSIVE METABOLIC PANEL
ALT: 11 U/L (ref 0–44)
AST: 22 U/L (ref 15–41)
Albumin: 3.7 g/dL (ref 3.5–5.0)
Alkaline Phosphatase: 103 U/L (ref 38–126)
Anion gap: 11 (ref 5–15)
BUN: <5 mg/dL — ABNORMAL LOW (ref 6–20)
CO2: 24 mmol/L (ref 22–32)
Calcium: 9.8 mg/dL (ref 8.9–10.3)
Chloride: 102 mmol/L (ref 98–111)
Creatinine, Ser: 0.66 mg/dL (ref 0.61–1.24)
GFR calc Af Amer: >60 mL/min (ref >60)
GFR calc non Af Amer: >60 mL/min (ref >60)
Glucose, Bld: 118 mg/dL — ABNORMAL HIGH (ref 70–99)
Potassium: 4.1 mmol/L (ref 3.5–5.1)
Sodium: 137 mmol/L (ref 135–145)
Total Bilirubin: 0.9 mg/dL (ref 0.3–1.2)
Total Protein: 7.9 g/dL (ref 6.5–8.1)

## 2020-02-12 LAB — CBC WITH DIFFERENTIAL/PLATELET
Abs Immature Granulocytes: 0.05 10*3/uL (ref 0.00–0.07)
Basophils Absolute: 0.1 10*3/uL (ref 0.0–0.1)
Basophils Relative: 1 %
Eosinophils Absolute: 0.4 10*3/uL (ref 0.0–0.5)
Eosinophils Relative: 5 %
HCT: 35.5 % — ABNORMAL LOW (ref 39.0–52.0)
Hemoglobin: 11.3 g/dL — ABNORMAL LOW (ref 13.0–17.0)
Immature Granulocytes: 1 %
Lymphocytes Relative: 18 %
Lymphs Abs: 1.5 10*3/uL (ref 0.7–4.0)
MCH: 30.2 pg (ref 26.0–34.0)
MCHC: 31.8 g/dL (ref 30.0–36.0)
MCV: 94.9 fL (ref 80.0–100.0)
Monocytes Absolute: 1.7 10*3/uL — ABNORMAL HIGH (ref 0.1–1.0)
Monocytes Relative: 21 %
Neutro Abs: 4.6 10*3/uL (ref 1.7–7.7)
Neutrophils Relative %: 54 %
Platelets: 217 10*3/uL (ref 150–400)
RBC: 3.74 MIL/uL — ABNORMAL LOW (ref 4.22–5.81)
RDW: 13.8 % (ref 11.5–15.5)
WBC: 8.4 10*3/uL (ref 4.0–10.5)
nRBC: 0 % (ref 0.0–0.2)

## 2020-02-12 LAB — GLUCOSE, CAPILLARY: Glucose-Capillary: 126 mg/dL — ABNORMAL HIGH (ref 70–99)

## 2020-02-12 LAB — PROTIME-INR
INR: 1.7 — ABNORMAL HIGH (ref 0.8–1.2)
Prothrombin Time: 20.2 seconds — ABNORMAL HIGH (ref 11.4–15.2)

## 2020-02-12 LAB — SARS CORONAVIRUS 2 (TAT 6-24 HRS): SARS Coronavirus 2: NEGATIVE

## 2020-02-12 LAB — LACTATE DEHYDROGENASE: LDH: 196 U/L — ABNORMAL HIGH (ref 98–192)

## 2020-02-12 LAB — SURGICAL PCR SCREEN
MRSA, PCR: NEGATIVE
Staphylococcus aureus: NEGATIVE

## 2020-02-12 MED ORDER — ONDANSETRON HCL 4 MG/2ML IJ SOLN: 4.0000 mg | Freq: Four times a day (QID) | INTRAMUSCULAR | Status: DC | PRN

## 2020-02-12 MED ORDER — PANTOPRAZOLE SODIUM 40 MG PO TBEC
40.0000 mg | DELAYED_RELEASE_TABLET | Freq: Every day | ORAL | Status: DC
  Administered 2020-02-13 – 2020-02-14 (×2): 40 mg via ORAL
  Filled 2020-02-12 (×2): qty 1

## 2020-02-12 MED ORDER — HYDROMORPHONE HCL 2 MG PO TABS
1.0000 mg | ORAL_TABLET | Freq: Once | ORAL | Status: AC
  Administered 2020-02-12: 1 mg via ORAL
  Filled 2020-02-12: qty 1

## 2020-02-12 MED ORDER — AMLODIPINE BESYLATE 10 MG PO TABS: 10.0000 mg | ORAL_TABLET | Freq: Every day | ORAL | Status: DC

## 2020-02-12 MED ORDER — HYDRALAZINE HCL 50 MG PO TABS: 50.0000 mg | ORAL_TABLET | Freq: Three times a day (TID) | ORAL | Status: DC

## 2020-02-12 MED ORDER — THIAMINE HCL 100 MG PO TABS
100.0000 mg | ORAL_TABLET | Freq: Every day | ORAL | Status: DC
  Administered 2020-02-13 – 2020-02-14 (×2): 100 mg via ORAL
  Filled 2020-02-12 (×2): qty 1

## 2020-02-12 MED ORDER — GABAPENTIN 300 MG PO CAPS: 600.0000 mg | ORAL_CAPSULE | Freq: Three times a day (TID) | ORAL | Status: DC

## 2020-02-12 MED ORDER — WARFARIN - PHARMACIST DOSING INPATIENT: Freq: Every day | Status: DC

## 2020-02-12 MED ORDER — NYSTATIN 100000 UNIT/GM EX POWD
1.0000 "application " | Freq: Three times a day (TID) | CUTANEOUS | Status: DC
  Administered 2020-02-12 – 2020-02-14 (×4): 1 via TOPICAL
  Filled 2020-02-12: qty 15

## 2020-02-12 MED ORDER — IPRATROPIUM-ALBUTEROL 0.5-2.5 (3) MG/3ML IN SOLN: 3.0000 mL | Freq: Four times a day (QID) | RESPIRATORY_TRACT | Status: DC | PRN

## 2020-02-12 MED ORDER — MUPIROCIN 2 % EX OINT
1.0000 "application " | TOPICAL_OINTMENT | Freq: Two times a day (BID) | CUTANEOUS | Status: DC
  Administered 2020-02-12 – 2020-02-14 (×4): 1 via NASAL
  Filled 2020-02-12: qty 22

## 2020-02-12 MED ORDER — PANTOPRAZOLE SODIUM 40 MG PO TBEC: 40.0000 mg | DELAYED_RELEASE_TABLET | Freq: Every day | ORAL | Status: DC

## 2020-02-12 MED ORDER — DEXTROSE 5 % IV SOLN
800.0000 mg | Freq: Three times a day (TID) | INTRAVENOUS | Status: DC
  Administered 2020-02-12 – 2020-02-14 (×6): 800 mg via INTRAVENOUS
  Filled 2020-02-12 (×12): qty 16

## 2020-02-12 MED ORDER — TRAMADOL HCL 50 MG PO TABS
50.0000 mg | ORAL_TABLET | Freq: Four times a day (QID) | ORAL | Status: DC | PRN
  Filled 2020-02-12 (×2): qty 1

## 2020-02-12 MED ORDER — ACETAMINOPHEN 325 MG PO TABS
650.0000 mg | ORAL_TABLET | ORAL | Status: DC | PRN
  Administered 2020-02-13 (×2): 650 mg via ORAL
  Filled 2020-02-12 (×3): qty 2

## 2020-02-12 MED ORDER — ZOLPIDEM TARTRATE 5 MG PO TABS
5.0000 mg | ORAL_TABLET | Freq: Every evening | ORAL | Status: DC | PRN
  Administered 2020-02-12 – 2020-02-13 (×2): 5 mg via ORAL
  Filled 2020-02-12 (×2): qty 1

## 2020-02-12 MED ORDER — DEXTROSE 5 % IV SOLN
800.0000 mg | Freq: Once | INTRAVENOUS | Status: AC
  Administered 2020-02-12: 800 mg via INTRAVENOUS
  Filled 2020-02-12: qty 16

## 2020-02-12 MED ORDER — GABAPENTIN 300 MG PO CAPS
600.0000 mg | ORAL_CAPSULE | Freq: Three times a day (TID) | ORAL | Status: DC
  Administered 2020-02-12 – 2020-02-14 (×6): 600 mg via ORAL
  Filled 2020-02-12 (×6): qty 2

## 2020-02-12 MED ORDER — AMLODIPINE BESYLATE 10 MG PO TABS
10.0000 mg | ORAL_TABLET | Freq: Every day | ORAL | Status: DC
  Administered 2020-02-13 – 2020-02-14 (×2): 10 mg via ORAL
  Filled 2020-02-12 (×2): qty 1

## 2020-02-12 MED ORDER — THIAMINE HCL 100 MG PO TABS: 100.0000 mg | ORAL_TABLET | Freq: Every day | ORAL | Status: DC

## 2020-02-12 MED ORDER — SPIRONOLACTONE 25 MG PO TABS
25.0000 mg | ORAL_TABLET | Freq: Every day | ORAL | Status: DC
  Administered 2020-02-13 – 2020-02-14 (×2): 25 mg via ORAL
  Filled 2020-02-12 (×2): qty 1

## 2020-02-12 MED ORDER — ZINC SULFATE 220 (50 ZN) MG PO CAPS
220.0000 mg | ORAL_CAPSULE | Freq: Every day | ORAL | Status: DC
  Administered 2020-02-13 – 2020-02-14 (×2): 220 mg via ORAL
  Filled 2020-02-12 (×2): qty 1

## 2020-02-12 MED ORDER — SODIUM CHLORIDE 0.9% FLUSH: 10.0000 mL | INTRAVENOUS | Status: DC | PRN

## 2020-02-12 MED ORDER — INSULIN ASPART 100 UNIT/ML ~~LOC~~ SOLN
0.0000 [IU] | Freq: Three times a day (TID) | SUBCUTANEOUS | Status: DC
  Administered 2020-02-12 – 2020-02-14 (×5): 2 [IU] via SUBCUTANEOUS

## 2020-02-12 MED ORDER — DOCUSATE SODIUM 100 MG PO CAPS
200.0000 mg | ORAL_CAPSULE | Freq: Every day | ORAL | Status: DC | PRN
  Administered 2020-02-13: 09:00:00 200 mg via ORAL
  Filled 2020-02-12: qty 2

## 2020-02-12 MED ORDER — CEFAZOLIN SODIUM-DEXTROSE 2-4 GM/100ML-% IV SOLN
2.0000 g | Freq: Three times a day (TID) | INTRAVENOUS | Status: DC
  Administered 2020-02-12 – 2020-02-14 (×7): 2 g via INTRAVENOUS
  Filled 2020-02-12 (×9): qty 100

## 2020-02-12 MED ORDER — OXYCODONE HCL 5 MG PO TABS
5.0000 mg | ORAL_TABLET | ORAL | Status: DC | PRN
  Administered 2020-02-12 – 2020-02-14 (×6): 5 mg via ORAL
  Filled 2020-02-12 (×6): qty 1

## 2020-02-12 MED ORDER — HYDRALAZINE HCL 50 MG PO TABS
50.0000 mg | ORAL_TABLET | Freq: Three times a day (TID) | ORAL | Status: DC
  Administered 2020-02-12 – 2020-02-13 (×3): 50 mg via ORAL
  Filled 2020-02-12 (×3): qty 1

## 2020-02-12 MED ORDER — SODIUM CHLORIDE 0.9% FLUSH
10.0000 mL | Freq: Two times a day (BID) | INTRAVENOUS | Status: DC
  Administered 2020-02-12 – 2020-02-13 (×3): 10 mL

## 2020-02-12 MED ORDER — WARFARIN SODIUM 7.5 MG PO TABS: 7.5000 mg | ORAL_TABLET | Freq: Once | ORAL | Status: DC

## 2020-02-12 MED ORDER — SODIUM CHLORIDE 0.9 % IV SOLN
INTRAVENOUS | Status: DC | PRN
  Administered 2020-02-12: 1000 mL via INTRAVENOUS

## 2020-02-12 MED ORDER — FLUTICASONE PROPIONATE 50 MCG/ACT NA SUSP
1.0000 | Freq: Two times a day (BID) | NASAL | Status: DC | PRN
  Filled 2020-02-12: qty 16

## 2020-02-12 MED ORDER — SACUBITRIL-VALSARTAN 97-103 MG PO TABS
1.0000 | ORAL_TABLET | Freq: Two times a day (BID) | ORAL | Status: DC
  Administered 2020-02-12 – 2020-02-14 (×4): 1 via ORAL
  Filled 2020-02-12 (×5): qty 1

## 2020-02-12 MED ORDER — CARBAMIDE PEROXIDE 6.5 % OT SOLN
5.0000 [drp] | Freq: Three times a day (TID) | OTIC | Status: DC
  Administered 2020-02-12 – 2020-02-14 (×5): 5 [drp] via OTIC
  Filled 2020-02-12: qty 15

## 2020-02-12 NOTE — H&P (Signed)
Advanced Heart Failure Team History and Physical Note   PCP:  [name], MD  PCP-Cardiology: Dr. Shirlee Latch     Reason for Admission: Disseminated Zoster  HPI:    59 y.o. with history of nonischemic cardiomyopathy, RLE DVT, cirrhosis, smoking, and OSA returns for followup of CHF/LVAD placemen  His Cardiomyopathy was first diagnosed in 6/19 in [place]. LHC/RHC at that time showed low output.  He was admitted to [facility] in 9/19 with low output HF and was started on milrinone and diuresed. On 08/03/18, he had Heartmate 3 LVAD placed.  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  No abscess on CT.  He was started on cefazolin IV for 6 wks.  BP-active meds decreased with low MAPs.   Recently admitted 3/9-24/21 with extensive lower sternal abscess. Taken to OR on 3/10 for  I&D. Did not visualize LVAD hardware but able to palpate outflow graft. Driveline site also with I&D. Wound vacs placed. Wound culture with MSSA. Back to OR 3/15 for repeat I&D, 2 wound vacs again placed. Returned to OR 3/19 to change the wound vac due to blockage alert.  ID evaluated and recommended IV abx x 8 weeks. Placed on Cefazolin. PICC line placed for continuation of home abx. HH services arranged. Per ID, after 8 weeks of IV treatment with Cefazolin, will return to oral suppression with cephalexin.  Developed HAs prior to d/c and CT head on 3/21 unremarkable.   Over past few days has had worsening HAs in back of scalp and behind left ear. Descibes it as lancinating pain. Also having fevers. In ER afebrile but found to have vesicular rash on back of head behind left ear and also isolated spots on his arms (see photos in Media tab). EDP d/w ID and felt to possibly have disseminated zoster. Started on IV acyclovir  No problems with wound vac  VAD interrogation & Equipment Management: Speed: 5700 Flow:  4.1 Power:  4.6w PI:  6.6 Hct: 42   + PI events   Review of Systems: [y] = yes, [ ]  = no   General: Weight gain [ ] ; Weight loss [ ] ; Anorexia [ ] ; Fatigue [ y]; Fever ]; Chills [ ] ; Weakness [ ]   Cardiac: Chest pain/pressure [ ] ; Resting SOB [ ] ; Exertional SOB [ ] ; Orthopnea [ ] ; Pedal Edema [ ] ; Palpitations [ ] ; Syncope [ ] ; Presyncope [ ] ; Paroxysmal nocturnal dyspnea[ ]   Pulmonary: Cough [ ] ; Wheezing[ ] ; Hemoptysis[ ] ; Sputum [ ] ; Snoring [ ]   GI: Vomiting[ ] ; Dysphagia[ ] ; Melena[ ] ; Hematochezia [ ] ; Heartburn[ ] ; Abdominal pain [ ] ; Constipation [ ] ; Diarrhea [ ] ; BRBPR [ ]   GU: Hematuria[ ] ; Dysuria [ ] ; Nocturia[ ]   Vascular: Pain in legs with walking [ ] ; Pain in feet with lying flat [ ] ; Non-healing sores [ ] ; Stroke [ ] ; TIA [ ] ; Slurred speech [ ] ;  Neuro: Headaches[y ]; Vertigo[ ] ; Seizures[ ] ; Paresthesias[ ] ;Blurred vision [ ] ; Diplopia [ ] ; Vision changes [ ]   Ortho/Skin: Arthritis ]; Joint pain [ y]; Muscle pain [ ] ; Joint swelling [ ] ; Back Pain [ ] ; Rash [ ]   Psych: Depression[ ] ; Anxiety[ ]   Heme: Bleeding problems [ ] ; Clotting disorders [ ] ; Anemia [ ]   Endocrine: Diabetes [ ] ; Thyroid dysfunction[ ]    Home Medications Prior to Admission medications   Medication Sig Start Date End Date Taking?  Authorizing Provider  warfarin (COUMADIN) 5 MG tablet TAKE 1 TABLET BY MOUTH DAILY EXCEPT 1 AND 1/2 TABLETS ON MONDAY, [facility] AND FRIDAY AS DIRECTED 12/20/19   Laurey Morale, MD  amLODipine (NORVASC) 5 MG tablet Take 2 tablets (10 mg total) by mouth daily. 09/29/19 12/28/19  Laurey Morale, MD  aspirin EC 81 MG tablet Take 81 mg by mouth daily.    [provider]  atorvastatin (LIPITOR) 20 MG tablet Take 1 tablet (20 mg total) by mouth daily. 07/14/19   Laurey Morale, MD  carbamide peroxide (DEBROX) 6.5 % OTIC solution Place 5 drops into both ears 3 (three) times daily. 12/10/18   Clegg, Amy D, NP  cephALEXin (KEFLEX) 500 MG capsule Take 1 capsule (500 mg total) by mouth 3  (three) times daily. 08/12/19   Blanchard Kelch, NP  cyanocobalamin 500 MCG tablet Take 1,000 mcg by mouth daily.     [provider]  docusate sodium (COLACE) 100 MG capsule Take 2 capsules (200 mg total) by mouth daily as needed for mild constipation. 08/12/18   Clegg, Amy D, NP  DULoxetine (CYMBALTA) 60 MG capsule Take 60 mg by mouth daily.    [provider]  enoxaparin (LOVENOX) 40 MG/0.4ML injection Inject 0.4 mLs (40 mg total) into the skin daily. Patient not taking: Reported on 09/15/2019 07/29/19   Laurey Morale, MD  fluticasone [facility]) 50 MCG/ACT nasal spray Place 1 spray into both nostrils 2 (two) times daily as needed for allergies.     [provider]  gabapentin (NEURONTIN) 300 MG capsule Take 600 mg by mouth 3 (three) times daily.    [provider]  glucose blood (CONTOUR NEXT TEST) test strip Use as instructed 12/18/18   Laurey Morale, MD  hydrOXYzine (VISTARIL) 25 MG capsule Take 25 mg by mouth 3 (three) times daily as needed for anxiety.    [provider]  Ipratropium-Albuterol (COMBIVENT) 20-100 MCG/ACT AERS respimat Inhale 1 puff into the lungs every 6 (six) hours.    [provider]  Lancets (FREESTYLE) lancets Use as instructed 12/18/18   Laurey Morale, MD  metFORMIN (GLUCOPHAGE-XR) 500 MG 24 hr tablet Take 500 mg by mouth 2 (two) times daily.    [provider]  methocarbamol (ROBAXIN) 750 MG tablet Take 750 mg by mouth 3 (three) times daily as needed for muscle spasms.  02/21/14   [provider]  nicotine (NICODERM CQ - DOSED IN MG/24 HR) 7 mg/24hr patch Place 1 patch (7 mg total) onto the skin daily as needed (tobacco craving). Patient not taking: Reported on 09/15/2019 08/12/18   Tonye Becket D, NP  nystatin (MYCOSTATIN/NYSTOP) powder Apply 1 application topically 3 (three) times daily. 12/13/19   Laurey Morale, MD  omeprazole (PRILOSEC) 20 MG capsule Take 20 mg by mouth daily.    [provider]  polyethylene glycol (MIRALAX / GLYCOLAX) packet Take 17 g by mouth daily as needed for mild constipation.    [provider]  sacubitril-valsartan (ENTRESTO) 97-103 MG Take 1 tablet by mouth 2 (two) times daily. 09/15/19   Laurey Morale, MD  sildenafil (VIAGRA) 100 MG tablet Take 50 mg by mouth daily as needed for erectile dysfunction.    [provider]  spironolactone (ALDACTONE) 25 MG tablet Take 1 tablet (25 mg total) by mouth daily. 10/14/18   Laurey Morale, MD  thiamine 100 MG tablet Take 1 tablet (100 mg total) by mouth daily. 04/05/19  Alford Highland, NP  traMADol (ULTRAM) 50 MG tablet Take 1 tablet (50 mg total) by mouth every 4 (four) hours as needed for moderate pain. 08/12/18   Clegg, Amy D, NP  traMADol (ULTRAM) 50 MG tablet Take 1 tablet (50 mg total) by mouth every 6 (six) hours as needed for severe pain. 08/31/19   Lawyer, Cristal Deer, PA-C  varenicline (CHANTIX STARTING MONTH PAK) 0.5 MG X 11 & 1 MG X 42 tablet Take one 0.5 mg tablet by mouth once daily for 3 days, then increase to one 0.5 mg tablet twice daily for 4 days, then increase to one 1 mg tablet twice daily. 11/17/19   Laurey Morale, MD  Zinc Gluconate 100 MG TABS Take 1 tablet (100 mg total) by mouth daily. Patient taking differently: Take 100 mg by mouth 2 (two) times a day.  12/15/18   Kerin Perna, MD  zolpidem (AMBIEN) 5 MG tablet Take 1 tablet (5 mg total) by mouth at bedtime. 06/16/19   Laurey Morale, MD    Past Medical History: Past Medical History:  Diagnosis Date  . 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 Cardiomyopathy, unspecified (HCC)   . CHF (congestive heart failure) (HCC)   . Chronic back pain   . Diabetes mellitus without complication (HCC)   . Enlarged heart   . Gastric ulcer   . Gastroenteritis   . H/O degenerative disc disease   . Hypertension   . LVAD (left ventricular assist device) present [facility])     Past Surgical History: Past Surgical  History:  Procedure Laterality Date  . APPLICATION OF WOUND VAC N/A 12/03/2018   Procedure: APPLICATION OF WOUND VAC;  Surgeon: Donata Clay, Theron Arista, MD;  Location: [facility] OR;  Service: Thoracic;  Laterality: N/A;  . APPLICATION OF WOUND VAC N/A 12/09/2018   Procedure: Removal of Wound Vac, Application of ACELL;  Surgeon: Kerin Perna, MD;  Location: [facility] OR;  Service: Thoracic;  Laterality: N/A;  . APPLICATION OF WOUND VAC N/A 01/26/2020   Procedure: APPLICATION OF WOUND VAC;  Surgeon: Kerin Perna, MD;  Location: [facility] OR;  Service: Thoracic;  Laterality: N/A;  . APPLICATION OF WOUND VAC N/A 01/31/2020   Procedure: WOUND VAC CHANGE;  Surgeon: Kerin Perna, MD;  Location: [facility] OR;  Service: Thoracic;  Laterality: N/A;  . INSERTION OF IMPLANTABLE LEFT VENTRICULAR ASSIST DEVICE N/A 08/03/2018   Procedure: INSERTION OF IMPLANTABLE LEFT VENTRICULAR ASSIST DEVICE - HM3;  Surgeon: Alleen Borne, MD;  Location: [facility] OR;  Service: Open Heart Surgery;  Laterality: N/A;  HM3  . LUMBAR LAMINECTOMY/DECOMPRESSION MICRODISCECTOMY    . ORCHIECTOMY    . RIGHT HEART CATH N/A 07/24/2018   Procedure: RIGHT HEART CATH;  Surgeon: Laurey Morale, MD;  Location: [facility] INVASIVE CV LAB;  Service: Cardiovascular;  Laterality: N/A;  . STERNAL WOUND DEBRIDEMENT N/A 12/03/2018   Procedure: WOUND DEBRIDEMENT WITH A-CELL;  Surgeon: Kerin Perna, MD;  Location: [facility] OR;  Service: Thoracic;  Laterality: N/A;  . STERNAL WOUND DEBRIDEMENT N/A 01/26/2020   Procedure: STERNAL WOUND & driveline IRRIGATION AND DEBRIDEMENT;  Surgeon: Kerin Perna, MD;  Location: [facility] OR;  Service: Thoracic;  Laterality: N/A;  . STERNAL WOUND DEBRIDEMENT N/A 01/31/2020   Procedure: STERNAL WOUND IRRIGATION;  Surgeon: Kerin Perna, MD;  Location: [facility] OR;  Service: Thoracic;  Laterality: N/A;  MESONIC JET  . STERNAL WOUND DEBRIDEMENT N/A 02/04/2020   Procedure: STERNAL WOUND DEBRIDEMENT WITH WOUND VAC CHANGE;  Surgeon: Prescott Gum, Collier Salina, MD;  Location: [place];   Service: Thoracic;  Laterality: N/A;  . TEE WITHOUT CARDIOVERSION N/A 08/03/2018   Procedure: TRANSESOPHAGEAL ECHOCARDIOGRAM (TEE);  Surgeon: Gaye Pollack, MD;  Location: [place];  Service: Open Heart Surgery;  Laterality: N/A;    Family History:  No family history on file.  Social History: Social History   Socioeconomic History  . Marital status: Single    Spouse name: Not on file  . Number of children: 7  . Years of education: 9th  . Highest education level: 9th grade  Occupational History  . Occupation: disabled  Tobacco Use  . Smoking status: Current Every Day Smoker    Types: Cigarettes    Start date: 2003  . Smokeless tobacco: Never Used  . Tobacco comment: Nicoderm patch   Substance and Sexual Activity  . Alcohol use: Not Currently  . Drug use: Not Currently  . Sexual activity: Not on file  Other Topics Concern  . Not on file  Social History Narrative  . Not on file   Social Determinants of Health   Financial Resource Strain:   . Difficulty of Paying Living Expenses:   Food Insecurity:   . Worried About Charity fundraiser in the Last Year:   . Arboriculturist in the Last Year:   Transportation Needs:   . Film/video editor (Medical):   Marland Kitchen Lack of Transportation (Non-Medical):   Physical Activity:   . Days of Exercise per Week:   . Minutes of Exercise per Session:   Stress:   . Feeling of Stress :   Social Connections:   . Frequency of Communication with Friends and Family:   . Frequency of Social Gatherings with Friends and Family:   . Attends Religious Services:   . Active Member of Clubs or Organizations:   . Attends Archivist Meetings:   Marland Kitchen Marital Status:     Allergies:  Allergies  Allergen Reactions  . Chlorhexidine Rash  . Other Rash    Prep pads    Objective:    Vital Signs:   Temp:  [98.2 F (36.8 C)-98.4 F (36.9 C)] 98.4 F (36.9 C) (03/27 0905) Pulse Rate:  [82-107] 104 (03/27 1345) Resp:  [15-29] 21 (03/27  1345) BP: (103)/(96) 103/96 (03/27 0905) SpO2:  [97 %-100 %] 98 % (03/27 1345)     Physical Exam    General:  Uncomfortable. No resp difficulty HEENT: normal + vesicular rash on occiput and behind left ear  Neck: supple. JVP not elevated.  Carotids 2+ bilat; no bruits. No lymphadenopathy or thryomegaly appreciated. Cor: LVAD hum.  Wound vac seated well  Lungs: Clear. Abdomen: obese soft, nontender, non-distended. No hepatosplenomegaly. No bruits or masses. Good bowel sounds. Driveline site clean. Anchor in place.  Extremities: no cyanosis, clubbing, rash. Warm no edema  Isolated vessicles on arms  Neuro: alert & oriented x 3. No focal deficits. Moves all 4 without problem    Labs     Basic Metabolic Panel: Recent Labs  Lab 02/07/20 0437 02/07/20 0437 02/08/20 0357 02/09/20 0500 02/12/20 1128  NA 141  --  141 140 137  K 3.9  --  4.1 3.9 4.1  CL 105  --  107 105 102  CO2 26  --  27 26 24   GLUCOSE 97  --  102* 110* 118*  BUN 6  --  6 <5* <5*  CREATININE 0.84  --  0.83 0.75 0.66  CALCIUM 9.0   < > 9.2 9.2 9.8   < > = values in this interval not displayed.    Liver Function Tests: Recent Labs  Lab 02/12/20 1128  AST 22  ALT 11  ALKPHOS 103  BILITOT 0.9  PROT 7.9  ALBUMIN 3.7   No results for input(s): LIPASE, AMYLASE in the last 168 hours. No results for input(s): AMMONIA in the last 168 hours.  CBC: Recent Labs  Lab 02/06/20 0435 02/07/20 0437 02/08/20 0357 02/09/20 0500 02/12/20 1128  WBC 9.3 7.6 7.4 6.5 8.4  NEUTROABS  --   --   --   --  4.6  HGB 8.7* 9.2* 9.5* 9.3* 11.3*  HCT 27.5* 29.1* 29.9* 29.6* 35.5*  MCV 97.2 96.4 95.8 95.5 94.9  PLT 368 347 331 266 217    Cardiac Enzymes: No results for input(s): CKTOTAL, CKMB, CKMBINDEX, TROPONINI in the last 168 hours.  BNP: BNP (last 3 results) No results for input(s): BNP in the last 8760 hours.  ProBNP (last 3 results) No results for input(s): PROBNP in the last 8760 hours.   CBG: Recent  Labs  Lab 02/05/20 1601 02/05/20 2120 02/06/20 0620  GLUCAP 90 120* 78    Coagulation Studies: Recent Labs    02/12/20 1128  LABPROT 20.2*  INR 1.7*    Imaging: No results found.    Assessment/Plan    1. Headaches and fevers - head CT negative 3/21 - suspect possible disseminated zoster - acyclovir started. No meningeal signs on exam - ID input appreciated  - contact and airborne precautions - check cultures  2. Sternal Abscess: - s/p derbridement on 3/10 - cx + for MSSA - on Ancef - wound vac in place   3. LVAD:  - VAD interrogated personally. Parameters stable. - INR 1.7 Discussed dosing with PharmD personally. - LDH 196  4. 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  LVAD parameters. NYHA class I-II.   He does not look volume overloaded. Continue home regimen.   MAP stable currently.  - volume status stable  - Continue spironolactone 25 mg daily.  - Continue Entresto to 97/103 bid.  - Should be transplant candidate eventually if he can quit smoking.  Will make transplant clinic referral when he is off totally.   5. H/o RLE DVT: On warfarin for LVAD.  - Will hold for surgical procedure.  - can use SCDs for DVT prophylaxis   6. OSA:  - Continue CPAP qhs.   7. Hyperlipidemia:  - Continue Atorvastatin 20 qhs  8. Type II diabetes:  - cover with SSI   Arvilla Meres, MD 02/12/2020, 3:39 PM  Advanced Heart Failure Team Pager [phone] (M-F; 7a - 4p)  Please contact [facility] for night-coverage after hours (4p -7a ) and weekends on [email or web address]

## 2020-02-12 NOTE — Progress Notes (Signed)
ANTICOAGULATION CONSULT NOTE  Pharmacy Consult for Warfarin Indication: LVAD  Allergies  Allergen Reactions  . Chlorhexidine Rash  . Other Rash    Prep pads    Patient Measurements:   Heparin Dosing Weight: 85 kg  Vital Signs: Temp: 98.4 F (36.9 C) (03/27 0905) Temp Source: Oral (03/27 0845) BP: 103/96 (03/27 0905) Pulse Rate: 104 (03/27 1345)  Labs: Recent Labs    02/12/20 1128  HGB 11.3*  HCT 35.5*  PLT 217  LABPROT 20.2*  INR 1.7*  CREATININE 0.66    Estimated Creatinine Clearance: 104.6 mL/min (by C-G formula based on SCr of 0.66 mg/dL).   Medical History: Past Medical History:  Diagnosis Date  . 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 Cardiomyopathy, unspecified (HCC)   . CHF (congestive heart failure) (HCC)   . Chronic back pain   . Diabetes mellitus without complication (HCC)   . Enlarged heart   . Gastric ulcer   . Gastroenteritis   . H/O degenerative disc disease   . Hypertension   . LVAD (left ventricular assist device) present [facility])     Assessment: 58yom presenting with headache and possible shingles, he is on chronic antibiotics for sternal abscess recently debrided. On warfarin PTA for hx LVAD (goal 2-2.5).   Recent discharge regimen was warfarin 7.5mg  TTSa and 5mg  all other days.  Hgb stable 11.3.  Pltc ok LDH stable 196.  INR below goal this morning at 1.7  Rash discussed with ID and they recommend continuing IV acyclovir, will enter doses.   Goal of Therapy:  INR 2-2.5 Monitor platelets by anticoagulation protocol: Yes   Plan:  Warfarin 10mg  tonight Daily INR Acyclovir 800mg  IV q8 hours - next tonight 22:00  PharmD., BCPS Clinical Pharmacist 02/12/2020 3:08 PM

## 2020-02-12 NOTE — ED Provider Notes (Signed)
[facility] EMERGENCY DEPARTMENT Provider Note   CSN: [id number] Arrival date & time: 02/12/20  0847     History Chief Complaint  Patient presents with  . Headache    (LVAD)  . Rash    Theodore Welch is a 59 y.o. male.  The history is provided by the patient and medical records. No language interpreter was used.  Headache Rash Associated symptoms: headaches    Theodore Welch is a 59 y.o. male who presents to the Emergency Department complaining of rash and headache. He presents the emergency department for evaluation of left sided headache that is been present for the last week. Pain is located throughout the left occipital and temporal head and radiates down to the left lateral neck. Pain is constant but worse with movement of his head. Three days ago he developed an associated rash on the back of his head that is painful to palpation. He reports temperature to 99 at home. He has a history of LVAD with complication of sternal abscess and is currently on PICC line antibiotics. He denies any shortness of breath, nausea, vomiting. He has generalized weakness, which is at his baseline. Symptoms are severe and constant nature.    Past Medical History:  Diagnosis Date  . 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 Cardiomyopathy, unspecified ([place])   . CHF (congestive heart failure) ([place])   . Chronic back pain   . Diabetes mellitus without complication ([place])   . Enlarged heart   . Gastric ulcer   . Gastroenteritis   . H/O degenerative disc disease   . Hypertension   . LVAD (left ventricular assist device) present [facility])     Patient Active Problem List   Diagnosis Date Noted  . Abscess of sternal region 01/27/2020  . Staph aureus infection 01/27/2020  . Vaccine counseling 08/12/2019  . Infection associated with driveline of left ventricular assist device (LVAD) ([place]) 11/28/2018  . Chronic systolic heart failure ([place]) 11/28/2018  . Complication involving left  ventricular assist device (LVAD) 11/23/2018  . OSA (obstructive sleep apnea) 09/02/2018  . LVAD (left ventricular assist device) present ([place]) 08/03/2018  . DVT (deep venous thrombosis) ([place]) 08/02/2018  . Goals of care, counseling/discussion   . Advance care planning   . Palliative care encounter   . Malnutrition of moderate degree 07/25/2018  . Ascites   . COPD (chronic obstructive pulmonary disease) ([place]) 07/21/2018  . HTN (hypertension) 07/21/2018  . Tobacco use 07/21/2018  . Congestive heart failure ([place]) 07/21/2018  . CHF (congestive heart failure) ([place]) 07/21/2018  . Type 2 diabetes mellitus ([place]) 07/21/2018  . Hyperlipidemia 07/21/2018  . Fibromyalgia syndrome 01/27/2017  . Myofascial pain syndrome, cervical 01/27/2017  . Lumbar radiculopathy 01/24/2014  . History of lumbar laminectomy for spinal cord decompression 12/07/2013  . 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    Past Surgical History:  Procedure Laterality Date  . APPLICATION OF WOUND VAC N/A 12/03/2018   Procedure: APPLICATION OF WOUND VAC;  Surgeon: Prescott Gum, Collier Salina, MD;  Location: [place];  Service: Thoracic;  Laterality: N/A;  . APPLICATION OF WOUND VAC N/A 12/09/2018   Procedure: Removal of Wound Vac, Application of ACELL;  Surgeon: Ivin Poot, MD;  Location: [place];  Service: Thoracic;  Laterality: N/A;  . APPLICATION OF WOUND VAC N/A 01/26/2020   Procedure: APPLICATION OF WOUND VAC;  Surgeon: Ivin Poot, MD;  Location: [place];  Service: Thoracic;  Laterality: N/A;  .  APPLICATION OF WOUND VAC N/A 01/31/2020   Procedure: WOUND VAC CHANGE;  Surgeon: Ivin Poot, MD;  Location: [place];  Service: Thoracic;  Laterality: N/A;  . INSERTION OF IMPLANTABLE LEFT VENTRICULAR ASSIST DEVICE N/A 08/03/2018   Procedure: INSERTION OF IMPLANTABLE LEFT VENTRICULAR ASSIST DEVICE - HM3;  Surgeon: Gaye Pollack, MD;  Location: [place];  Service: Open Heart Surgery;  Laterality: N/A;  HM3  .  LUMBAR LAMINECTOMY/DECOMPRESSION MICRODISCECTOMY    . ORCHIECTOMY    . RIGHT HEART CATH N/A 07/24/2018   Procedure: RIGHT HEART CATH;  Surgeon: Larey Dresser, MD;  Location: [place] [facility];  Service: Cardiovascular;  Laterality: N/A;  . STERNAL WOUND DEBRIDEMENT N/A 12/03/2018   Procedure: WOUND DEBRIDEMENT WITH A-CELL;  Surgeon: Ivin Poot, MD;  Location: [place];  Service: Thoracic;  Laterality: N/A;  . STERNAL WOUND DEBRIDEMENT N/A 01/26/2020   Procedure: STERNAL WOUND & driveline IRRIGATION AND DEBRIDEMENT;  Surgeon: Ivin Poot, MD;  Location: [place];  Service: Thoracic;  Laterality: N/A;  . STERNAL WOUND DEBRIDEMENT N/A 01/31/2020   Procedure: STERNAL WOUND IRRIGATION;  Surgeon: Ivin Poot, MD;  Location: [place];  Service: Thoracic;  Laterality: N/A;  [place] JET  . STERNAL WOUND DEBRIDEMENT N/A 02/04/2020   Procedure: STERNAL WOUND DEBRIDEMENT WITH WOUND VAC CHANGE;  Surgeon: Ivin Poot, MD;  Location: [place];  Service: Thoracic;  Laterality: N/A;  . TEE WITHOUT CARDIOVERSION N/A 08/03/2018   Procedure: TRANSESOPHAGEAL ECHOCARDIOGRAM (TEE);  Surgeon: Gaye Pollack, MD;  Location: [place];  Service: Open Heart Surgery;  Laterality: N/A;       No family history on file.  Social History   Tobacco Use  . Smoking status: Current Every Day Smoker    Types: Cigarettes    Start date: 2003  . Smokeless tobacco: Never Used  . Tobacco comment: Nicoderm patch   Substance Use Topics  . Alcohol use: Not Currently  . Drug use: Not Currently    Home Medications Prior to Admission medications   Medication Sig Start Date End Date Taking? Authorizing Provider  amLODipine (NORVASC) 5 MG tablet Take 2 tablets (10 mg total) by mouth daily. Patient taking differently: Take 10 mg by mouth in the morning and at bedtime.  09/29/19 01/25/20  Larey Dresser, MD  aspirin EC 81 MG tablet Take 81 mg by mouth daily.    [provider]  carbamide peroxide (DEBROX) 6.5 % OTIC  solution Place 5 drops into both ears 3 (three) times daily. Patient taking differently: Place 5 drops into both ears 2 (two) times daily.  12/10/18   Clegg, Amy D, NP  ceFAZolin (ANCEF) IVPB Inject 2 g into the vein every 8 (eight) hours. Indication:  MSSA LVAD infection Last Day of Therapy:  03/27/2020 Labs - Once weekly:  CBC/D and BMP, Labs - Every other week:  ESR and CRP 02/09/20 04/04/20  Lyda Jester M, PA-C  cyanocobalamin 500 MCG tablet Take 1,000 mcg by mouth daily.     [provider]  docusate sodium (COLACE) 100 MG capsule Take 2 capsules (200 mg total) by mouth daily as needed for mild constipation. Patient not taking: Reported on 01/25/2020 08/12/18   Darrick Grinder D, NP  fluticasone (FLONASE) 50 MCG/ACT nasal spray Place 1 spray into both nostrils 2 (two) times daily as needed for allergies.     [provider]  gabapentin (NEURONTIN) 300 MG capsule Take 600 mg by mouth 2 (two) times daily as  needed (nerve pain).     [provider]  glucose blood (CONTOUR NEXT TEST) test strip Use as instructed 12/18/18   Larey Dresser, MD  hydrALAZINE (APRESOLINE) 50 MG tablet Take 1 tablet (50 mg total) by mouth 3 (three) times daily. 02/09/20   Lyda Jester M, PA-C  Ipratropium-Albuterol (COMBIVENT) 20-100 MCG/ACT AERS respimat Inhale 1 puff into the lungs every 6 (six) hours as needed for wheezing or shortness of breath.     [provider]  Lancets (FREESTYLE) lancets Use as instructed 12/18/18   Larey Dresser, MD  nystatin (MYCOSTATIN/NYSTOP) powder Apply 1 application topically 3 (three) times daily. Patient taking differently: Apply 1 application topically 3 (three) times daily. bamdeAGE 12/13/19   Larey Dresser, MD  oxyCODONE (OXY IR/ROXICODONE) 5 MG immediate release tablet Take 1 tablet (5 mg total) by mouth every 4 (four) hours as needed for moderate pain (Take 1 tablet prior to VAD Appointment on 3/26). 02/09/20   Lyda Jester M, PA-C    pantoprazole (PROTONIX) 40 MG tablet Take 1 tablet (40 mg total) by mouth daily. 02/09/20   Lyda Jester M, PA-C  sacubitril-valsartan (ENTRESTO) 97-103 MG Take 1 tablet by mouth 2 (two) times daily. 09/15/19   Larey Dresser, MD  sildenafil (VIAGRA) 100 MG tablet Take 50 mg by mouth daily as needed for erectile dysfunction.    [provider]  spironolactone (ALDACTONE) 25 MG tablet Take 1 tablet (25 mg total) by mouth daily. 02/09/20   Lyda Jester M, PA-C  thiamine 100 MG tablet Take 1 tablet (100 mg total) by mouth daily. 04/05/19   Georgiana Shore, NP  traMADol (ULTRAM) 50 MG tablet Take 1 tablet (50 mg total) by mouth every 6 (six) hours as needed for severe pain. 08/31/19   Lawyer, Harrell Gave, PA-C  varenicline (CHANTIX CONTINUING MONTH PAK) 1 MG tablet Take 1 mg by mouth 2 (two) times daily.    [provider]  warfarin (COUMADIN) 5 MG tablet TAKE 1 TABLET BY MOUTH DAILY EXCEPT 1 AND 1/2 TABLETS ON MONDAY, WEDNESDAY AND FRIDAY AS DIRECTED Patient taking differently: Take 5-7.5 mg by mouth See admin instructions. Take 5 mg by mouth at bedtime on Sun/Mon/Wed/Fri and 7.5 mg on Tues/Thurs/Sat 12/20/19   Larey Dresser, MD  Zinc Gluconate 100 MG TABS Take 1 tablet (100 mg total) by mouth daily. Patient taking differently: Take 100 mg by mouth 2 (two) times a day.  12/15/18   Ivin Poot, MD  zolpidem (AMBIEN) 5 MG tablet Take 1 tablet (5 mg total) by mouth at bedtime. Patient not taking: Reported on 01/25/2020 06/16/19   Larey Dresser, MD    Allergies    Chlorhexidine and Other  Review of Systems   Review of Systems  Skin: Positive for rash.  Neurological: Positive for headaches.  All other systems reviewed and are negative.   Physical Exam Updated Vital Signs BP (!) 103/96   Pulse 96   Temp 98.4 F (36.9 C)   Resp 15   SpO2 100%   Physical Exam Vitals and nursing note reviewed.  Constitutional:      Appearance: He is well-developed.  HENT:      Head: Normocephalic and atraumatic.  Cardiovascular:     Comments:   LVAD in place with wound back to the sternal and left upper quadrant region. Dressing is clean, dry and intact. Mechanical hum Pulmonary:     Effort: Pulmonary effort is normal. No respiratory distress.  Breath sounds: Normal breath sounds.  Abdominal:     Palpations: Abdomen is soft.     Tenderness: There is no abdominal tenderness. There is no guarding or rebound.  Musculoskeletal:        General: No tenderness.     Comments: PICC line in the right upper extremity  Skin:    General: Skin is warm and dry.     Comments: There is a vesicular rash to the left occipital region. There are also scattered vesicles to the left ear as well as the right shoulder and sternal region.  Neurological:     Mental Status: He is alert and oriented to person, place, and time.     Comments: EOM I. Pupils equal round and reactive. No asymmetry of facial movements. Visual fields are grossly intact. Five out of five strength in all four extremities with sensation to light touch intact in all four extremities  Psychiatric:        Behavior: Behavior normal.     ED Results / Procedures / Treatments   Labs (all labs ordered are listed, but only abnormal results are displayed) Labs Reviewed - No data to display  EKG None  Radiology No results found.  Procedures Procedures (including critical care time)  Medications Ordered in ED Medications - No data to display  ED Course  I have reviewed the triage vital signs and the nursing notes.  Pertinent labs & imaging results that were available during my care of the patient were reviewed by me and considered in my medical decision making (see chart for details).    MDM Rules/Calculators/A&P                     Patient with LVAD complicated with MSSA infection here for evaluation of left sided headache. He is non-toxic appearing on evaluation with no focal neurologic deficits.  He does have a rash consistent with zoster to the occipital scalp. He also has a few scattered vesicles on the trunk and right shoulder concerning for possible disseminated zoster.  D/w Dr. Tommy Medal with ID - recommends IV acyclovir with airborne and contact precautions as well as sending a sample for VZV PCR analysis.    Discuss with Cardiology - plan to admit for further treatment. Final Clinical Impression(s) / ED Diagnoses Final diagnoses:  None    Rx / DC Orders ED Discharge Orders    None       Quintella Reichert, MD 02/12/20 1624

## 2020-02-12 NOTE — ED Triage Notes (Addendum)
Pt reports L sided headache since he was discharged from hospital on Wednesday. pts wife states she noticed a rash to the L side of patient's scalp and forehead that started yesterday. Red, blistery rash present to L posterior scalp with scabbed over lesions on L forehead as well. Pt reports h/a is constant but at times more intense with flashes of light to his L eye. Pt is an LVAD patient, states they made LVAD RN aware he was coming to the ED. Pt a/ox4, resp e/u, nad. Also currently receiving abx through R PICC line.

## 2020-02-12 NOTE — ED Notes (Signed)
Report called to Shanda Bumps, [facility] RN

## 2020-02-13 DIAGNOSIS — B027 Disseminated zoster: Principal | ICD-10-CM

## 2020-02-13 DIAGNOSIS — I428 Other cardiomyopathies: Secondary | ICD-10-CM

## 2020-02-13 DIAGNOSIS — Z95811 Presence of heart assist device: Secondary | ICD-10-CM

## 2020-02-13 DIAGNOSIS — Z720 Tobacco use: Secondary | ICD-10-CM

## 2020-02-13 DIAGNOSIS — Z8619 Personal history of other infectious and parasitic diseases: Secondary | ICD-10-CM

## 2020-02-13 DIAGNOSIS — K7460 Unspecified cirrhosis of liver: Secondary | ICD-10-CM

## 2020-02-13 DIAGNOSIS — Z91048 Other nonmedicinal substance allergy status: Secondary | ICD-10-CM

## 2020-02-13 LAB — GLUCOSE, CAPILLARY
Glucose-Capillary: 162 mg/dL — ABNORMAL HIGH (ref 70–99)
Glucose-Capillary: 136 mg/dL — ABNORMAL HIGH (ref 70–99)
Glucose-Capillary: 103 mg/dL — ABNORMAL HIGH (ref 70–99)
Glucose-Capillary: 142 mg/dL — ABNORMAL HIGH (ref 70–99)
Glucose-Capillary: 143 mg/dL — ABNORMAL HIGH (ref 70–99)

## 2020-02-13 LAB — BASIC METABOLIC PANEL
Anion gap: 10 (ref 5–15)
BUN: 5 mg/dL — ABNORMAL LOW (ref 6–20)
CO2: 25 mmol/L (ref 22–32)
Calcium: 9.7 mg/dL (ref 8.9–10.3)
Chloride: 102 mmol/L (ref 98–111)
Creatinine, Ser: 0.69 mg/dL (ref 0.61–1.24)
GFR calc Af Amer: >60 mL/min (ref >60)
GFR calc non Af Amer: >60 mL/min (ref >60)
Glucose, Bld: 136 mg/dL — ABNORMAL HIGH (ref 70–99)
Potassium: 4 mmol/L (ref 3.5–5.1)
Sodium: 137 mmol/L (ref 135–145)

## 2020-02-13 LAB — CBC
HCT: 36.3 % — ABNORMAL LOW (ref 39.0–52.0)
Hemoglobin: 11.7 g/dL — ABNORMAL LOW (ref 13.0–17.0)
MCH: 30.1 pg (ref 26.0–34.0)
MCHC: 32.2 g/dL (ref 30.0–36.0)
MCV: 93.3 fL (ref 80.0–100.0)
Platelets: 248 10*3/uL (ref 150–400)
RBC: 3.89 MIL/uL — ABNORMAL LOW (ref 4.22–5.81)
RDW: 13.8 % (ref 11.5–15.5)
WBC: 8.9 10*3/uL (ref 4.0–10.5)
nRBC: 0 % (ref 0.0–0.2)

## 2020-02-13 LAB — PROTIME-INR
INR: 1.5 — ABNORMAL HIGH (ref 0.8–1.2)
Prothrombin Time: 18.4 seconds — ABNORMAL HIGH (ref 11.4–15.2)

## 2020-02-13 LAB — LACTATE DEHYDROGENASE: LDH: 175 U/L (ref 98–192)

## 2020-02-13 MED ORDER — WARFARIN - PHARMACIST DOSING INPATIENT: Freq: Every day | Status: DC

## 2020-02-13 MED ORDER — TRAMADOL HCL 50 MG PO TABS
100.0000 mg | ORAL_TABLET | Freq: Four times a day (QID) | ORAL | Status: DC | PRN
  Administered 2020-02-13 – 2020-02-14 (×2): 100 mg via ORAL
  Filled 2020-02-13 (×2): qty 2

## 2020-02-13 MED ORDER — MAGNESIUM HYDROXIDE 400 MG/5ML PO SUSP
30.0000 mL | Freq: Once | ORAL | Status: AC
  Administered 2020-02-13: 30 mL via ORAL
  Filled 2020-02-13: qty 30

## 2020-02-13 MED ORDER — WARFARIN SODIUM 5 MG PO TABS
10.0000 mg | ORAL_TABLET | Freq: Once | ORAL | Status: AC
  Administered 2020-02-13: 10 mg via ORAL
  Filled 2020-02-13: qty 2

## 2020-02-13 NOTE — Progress Notes (Signed)
ANTICOAGULATION CONSULT NOTE  Pharmacy Consult for Warfarin Indication: LVAD  Allergies  Allergen Reactions  . Chlorhexidine Rash  . Other Rash    Prep pads    Patient Measurements: Height: 5\' 10"  (177.8 cm) Weight: 178 lb 12.7 oz (81.1 kg) IBW/kg (Calculated) : 73 Heparin Dosing Weight: 85 kg  Vital Signs: Temp: 98.7 F (37.1 C) (03/28 0800) Temp Source: Oral (03/28 0800) BP: 106/92 (03/28 0800) Pulse Rate: 93 (03/28 0800)  Labs: Recent Labs    02/12/20 1128 02/13/20 0540  HGB 11.3* 11.7*  HCT 35.5* 36.3*  PLT 217 248  LABPROT 20.2* 18.4*  INR 1.7* 1.5*  CREATININE 0.66 0.69    Estimated Creatinine Clearance: 101.4 mL/min (by C-G formula based on SCr of 0.69 mg/dL).   Medical History: Past Medical History:  Diagnosis Date  . 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 Cardiomyopathy, unspecified (H[facility])   . CHF (congestive heart failure) ([facility])   . Chronic back pain   . Diabetes mellitus without complication ([facility])   . Enlarged heart   . Gastric ulcer   . Gastroenteritis   . H/O degenerative disc disease   . Hypertension   . LVAD (left ventricular assist device) present [facility])     Assessment: 58yom presenting with headache and possible shingles, he is on chronic antibiotics for sternal abscess recently debrided. On warfarin PTA for hx LVAD (goal 2-2.5).   Recent discharge regimen was warfarin 7.5mg  TTSa and 5mg  all other days.  Hgb stable 11.7.  Pltc ok LDH stable 175.  INR below goal this morning at 1.5, warfarin accidentally cancelled yesterday. Will give boosted dose tonight, if INR still low in am will start low dose heparin per discussion with HF team.   Goal of Therapy:  INR 2-2.5 Monitor platelets by anticoagulation protocol: Yes   Plan:  Warfarin 10mg  tonight Daily INR  [facility] PharmD., BCPS Clinical Pharmacist 02/13/2020 10:04 AM

## 2020-02-13 NOTE — Progress Notes (Signed)
Advanced Heart Failure VAD Team Note  PCP-Cardiologist: No primary care provider on file.   Subjective:    Still with HA and neck pain. Mental status ok. No meningeal signs.  AF  Constipated.   No SOB, orthopnea or PND  LVAD INTERROGATION:  HeartMate 3 LVAD:   Flow 4.6 liters/min, speed 5700, power 5.0, PI 5.4 +PI events.    Objective:    Vital Signs:   Temp:  [97.8 F (36.6 C)-99.6 F (37.6 C)] 98.7 F (37.1 C) (03/28 0800) Pulse Rate:  [36-108] 93 (03/28 0800) Resp:  [14-34] 21 (03/28 0800) BP: (106-142)/(86-113) 106/92 (03/28 0800) SpO2:  [94 %-100 %] 100 % (03/28 0800) Weight:  [81.1 kg-81.8 kg] 81.1 kg (03/28 0500) Last BM Date: 02/10/20 Mean arterial Pressure 90s  Intake/Output:   Intake/Output Summary (Last 24 hours) at 02/13/2020 1113 Last data filed at 02/13/2020 1000 Gross per 24 hour  Intake 1562.25 ml  Output 2600 ml  Net -1037.75 ml     Physical Exam    General:  Well appearing. No resp difficulty HEENT: normal + vesicular rash on occiput and behind left ear  Neck: supple. JVP . Carotids 2+ bilat; no bruits. No lymphadenopathy or thyromegaly appreciated. Cor: Mechanical heart sounds with LVAD hum present. Wound vac ok  Lungs: clear Abdomen: soft, nontender, nondistended. No hepatosplenomegaly. No bruits or masses. Good bowel sounds. Driveline: C/D/I; securement device intact and driveline incorporated Extremities: no cyanosis, clubbing, rash, edema. Few random vesicles on extremities  Neuro: alert & orientedx3, cranial nerves grossly intact. moves all 4 extremities w/o difficulty. Affect pleasant   Telemetry   NSR 90s Personally reviewed  Labs   Basic Metabolic Panel: Recent Labs  Lab 02/07/20 0437 02/07/20 0437 02/08/20 0357 02/08/20 0357 02/09/20 0500 02/12/20 1128 02/13/20 0540  NA 141  --  141  --  140 137 137  K 3.9  --  4.1  --  3.9 4.1 4.0  CL 105  --  107  --  105 102 102  CO2 26  --  27  --  26 24 25   GLUCOSE 97  --   102*  --  110* 118* 136*  BUN 6  --  6  --  <5* <5* 5*  CREATININE 0.84  --  0.83  --  0.75 0.66 0.69  CALCIUM 9.0   < > 9.2   < > 9.2 9.8 9.7   < > = values in this interval not displayed.    Liver Function Tests: Recent Labs  Lab 02/12/20 1128  AST 22  ALT 11  ALKPHOS 103  BILITOT 0.9  PROT 7.9  ALBUMIN 3.7   No results for input(s): LIPASE, AMYLASE in the last 168 hours. No results for input(s): AMMONIA in the last 168 hours.  CBC: Recent Labs  Lab 02/07/20 0437 02/08/20 0357 02/09/20 0500 02/12/20 1128 02/13/20 0540  WBC 7.6 7.4 6.5 8.4 8.9  NEUTROABS  --   --   --  4.6  --   HGB 9.2* 9.5* 9.3* 11.3* 11.7*  HCT 29.1* 29.9* 29.6* 35.5* 36.3*  MCV 96.4 95.8 95.5 94.9 93.3  PLT 347 331 266 217 248    INR: Recent Labs  Lab 02/08/20 0357 02/08/20 1300 02/09/20 0500 02/12/20 1128 02/13/20 0540  INR 1.5* 1.6* 2.0* 1.7* 1.5*    Other results:   Imaging    No results found.   Medications:     Scheduled Medications: . amLODipine  10 mg Oral Daily  .  carbamide peroxide  5 drop Both EARS TID  . gabapentin  600 mg Oral TID  . hydrALAZINE  50 mg Oral TID  . insulin aspart  0-15 Units Subcutaneous TID WC  . mupirocin ointment  1 application Nasal BID  . nystatin  1 application Topical TID  . pantoprazole  40 mg Oral Daily  . sacubitril-valsartan  1 tablet Oral BID  . sodium chloride flush  10-40 mL Intracatheter Q12H  . spironolactone  25 mg Oral Daily  . thiamine  100 mg Oral Daily  . zinc sulfate  220 mg Oral Daily     Infusions: . sodium chloride 10 mL/hr at 02/13/20 0800  . acyclovir Stopped (02/13/20 0441)  .  ceFAZolin (ANCEF) IV Stopped (02/13/20 0617)     PRN Medications:  sodium chloride, acetaminophen, docusate sodium, fluticasone, ipratropium-albuterol, ondansetron (ZOFRAN) IV, oxyCODONE, sodium chloride flush, traMADol, zolpidem   Assessment/Plan:    1. Headaches and fevers -> suspect disseminated zoster - head CT negative  3/21 - acyclovir started. No meningeal signs on exam - ID input appreciated. I have reached out to them today to as well. Continue IV acyclovir, No need for LP at this time  - contact and airborne precautions in place - BCx NGTD  2. Sternal Abscess: - s/p derbridement on 3/10 - cx + for MSSA - on Ancef - wound vac in place. stable  3. LVAD:  - VAD interrogated personally. Parameters stable. - INR 1.5 Discussed dosing with PharmD personally. Will dose aggressively today. If INR not bumping will need heparin - LDH 175  4. 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 LVAD parameters.NYHA class I-II. He does not look volume overloaded. Continue home regimen.  - MAPs high. Will restart amlodipine - volume status stable - Continue spironolactone 25 mg daily.  -ContinueEntresto to 97/103 bid.  - Should be transplant candidate eventually if he can quit smoking. Will make transplant clinic referral when he is off totally.   5. H/o RLE DVT: On warfarin for LVAD.  - INR dosing at above  6. OSA:  - Continue CPAP qhs.   7. Hyperlipidemia:  - Continue Atorvastatin 20 qhs  8. Type II diabetes:  - cover with SSI   Length of Stay: 1  Arvilla Meres, MD 02/13/2020, 11:13 AM  VAD Team --- VAD ISSUES ONLY--- Pager [phone] (7am - 7am)  Advanced Heart Failure Team  Pager [phone] (M-F; 7a - 4p)  Please contact [facility] for night-coverage after hours (4p -7a ) and weekends on [email or web address]

## 2020-02-13 NOTE — Consult Note (Signed)
Date of Admission:  02/12/2020          Reason for Consult: Disseminated zoster    Referring Provider: Dr. Haroldine Laws   Assessment:  1. Disseminated zoster 2. Recurrent MSSA infection involving LVAD, sternal abscess status post I&D currently on cefazolin with plans for chronic Keflex 3. LVAD 4. Cardiomyopathy  Plan:  1. Continue IV acyclovir 2. Consider CT with contrast to exclude CNS involvement though he does not have much to suggest CNS complication of zoster and these tend to occur later in disease course 3. Continue cefazolin  Dr. Megan Salon to check on the patient tomorrow.  Principal Problem:   Disseminated zoster Active Problems:   LVAD (left ventricular assist device) present (HCC)   Complication involving left ventricular assist device (LVAD)   Infection associated with driveline of left ventricular assist device (LVAD) (HCC)   Chronic systolic (congestive) heart failure (HCC)   Headache   Scheduled Meds: . amLODipine  10 mg Oral Daily  . carbamide peroxide  5 drop Both EARS TID  . gabapentin  600 mg Oral TID  . hydrALAZINE  50 mg Oral TID  . insulin aspart  0-15 Units Subcutaneous TID WC  . mupirocin ointment  1 application Nasal BID  . nystatin  1 application Topical TID  . pantoprazole  40 mg Oral Daily  . sacubitril-valsartan  1 tablet Oral BID  . sodium chloride flush  10-40 mL Intracatheter Q12H  . spironolactone  25 mg Oral Daily  . thiamine  100 mg Oral Daily  . zinc sulfate  220 mg Oral Daily   Continuous Infusions: . sodium chloride 10 mL/hr at 02/13/20 0800  . acyclovir Stopped (02/13/20 0441)  .  ceFAZolin (ANCEF) IV Stopped (02/13/20 0617)   PRN Meds:.sodium chloride, acetaminophen, docusate sodium, fluticasone, ipratropium-albuterol, ondansetron (ZOFRAN) IV, oxyCODONE, sodium chloride flush, traMADol, zolpidem  HPI: Theodore Welch is a 59 y.o. male with nonischemic cardiomyopathy with an LVAD, also comorbid cirrhosis and smoking.  His  course has been complicated by recurrent MSSA infection January 2020, then recurrence of driveline infection with MSSA bacteremia in May 2020.  More recently admitted in March 9 with an extensive lower sternal abscess.  He had I&D of this area.  He was then taken back to the OR on the 15th repeat I&D.  And again on the 19th.  Bacteremia involving driveline, but also overt bacteremia.  He had admit recent admission in March with extensive lower sternal abscess.  He was on IV cefazolin with plans for 8 weeks of therapy and then conversion to chronic Keflex again.  In the interim however he has developed severe headaches had a noncontrasted head CT on 21 March.  He then began  developing a rashoccipital region of his head consistent with herpes zoster.  He then also developed additional satellite lesions on chest and abdomen which raise concern for disseminated zoster.  I was called yesterday by ER physician Dr Ralene Bathe who took pictures of the rash.  I advised IV acyclovir my understanding is that she was able to unroofed one of the vesicles and sent for varicella-zoster PCR.  I would continue to treat him with IV acyclovir for now.  We could continue with conventional treatment of 7 to 10 days with IV acyclovir or consider change to highly bioavailable Valtrex.  For now continue to observe onto heart with IV acyclovir.  We will continue cefazolin for his deep MSSA infection  Dr. Megan Salon is back tomorrow.  Review of Systems: Review of Systems  Constitutional: Positive for fever. Negative for chills, malaise/fatigue and weight loss.  HENT: Negative for congestion and sore throat.   Eyes: Negative for blurred vision and photophobia.  Respiratory: Negative for cough, shortness of breath and wheezing.   Cardiovascular: Negative for chest pain, palpitations and leg swelling.  Gastrointestinal: Negative for abdominal pain, blood in stool, constipation, diarrhea, heartburn, melena, nausea and  vomiting.  Genitourinary: Negative for dysuria, flank pain and hematuria.  Musculoskeletal: Negative for back pain, falls, joint pain and myalgias.  Skin: Negative for itching and rash.  Neurological: Positive for headaches. Negative for dizziness, focal weakness, loss of consciousness and weakness.  Endo/Heme/Allergies: Does not bruise/bleed easily.  Psychiatric/Behavioral: Negative for depression and suicidal ideas. The patient does not have insomnia.     Past Medical History:  Diagnosis Date  . 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 Cardiomyopathy, unspecified (HCC)   . CHF (congestive heart failure) (HCC)   . Chronic back pain   . Diabetes mellitus without complication (HCC)   . Enlarged heart   . Gastric ulcer   . Gastroenteritis   . H/O degenerative disc disease   . Hypertension   . LVAD (left ventricular assist device) present [facility])     Social History   Tobacco Use  . Smoking status: Current Every Day Smoker    Types: Cigarettes    Start date: 2003  . Smokeless tobacco: Never Used  . Tobacco comment: Nicoderm patch   Substance Use Topics  . Alcohol use: Not Currently  . Drug use: Not Currently    No family history on file. Allergies  Allergen Reactions  . Chlorhexidine Rash  . Other Rash    Prep pads    OBJECTIVE: Blood pressure (!) 106/92, pulse 93, temperature 98.7 F (37.1 C), temperature source Oral, resp. rate (!) 21, height 5\' 10"  (1.778 m), weight 81.1 kg, SpO2 100 %.  Physical Exam Constitutional:      General: He is not in acute distress.    Appearance: Normal appearance. He is well-developed. He is not ill-appearing or diaphoretic.  HENT:     Head: Normocephalic and atraumatic.     Right Ear: Hearing and external ear normal.     Left Ear: Hearing and external ear normal.     Nose: No nasal deformity or rhinorrhea.  Eyes:     General: No scleral icterus.    Conjunctiva/sclera: Conjunctivae normal.     Right eye: Right conjunctiva is not  injected.     Left eye: Left conjunctiva is not injected.     Pupils: Pupils are equal, round, and reactive to light.  Neck:     Vascular: No JVD.  Cardiovascular:     Heart sounds: S1 normal and S2 normal.  Pulmonary:     Effort: Pulmonary effort is normal. No respiratory distress.  Abdominal:     General: Bowel sounds are normal.     Palpations: Abdomen is soft.  Musculoskeletal:        General: Normal range of motion.     Right shoulder: Normal.     Left shoulder: Normal.     Cervical back: Normal range of motion and neck supple.     Right hip: Normal.     Left hip: Normal.     Right knee: Normal.     Left knee: Normal.  Lymphadenopathy:     Head:     Right side of head: No submandibular, preauricular or posterior  auricular adenopathy.     Left side of head: No submandibular, preauricular or posterior auricular adenopathy.     Cervical: No cervical adenopathy.     Right cervical: No superficial or deep cervical adenopathy.    Left cervical: No superficial or deep cervical adenopathy.  Skin:    General: Skin is warm and dry.     Coloration: Skin is not pale.     Findings: Rash present. No abrasion, bruising, ecchymosis, erythema or lesion.     Nails: There is no clubbing.  Neurological:     Mental Status: He is alert and oriented to person, place, and time.     Cranial Nerves: No cranial nerve deficit or dysarthria.     Sensory: No sensory deficit.     Motor: No weakness.     Coordination: Coordination normal.     Gait: Gait normal.  Psychiatric:        Attention and Perception: He is attentive.        Mood and Affect: Mood normal.        Speech: Speech normal.        Behavior: Behavior normal. Behavior is cooperative.        Thought Content: Thought content normal.        Judgment: Judgment normal.    Vesicular rash with some areas of more intense erythema.   Picture from yeseterday   Scalp        These are exquisitely tender  Similar today    Lesions on chest also similar to that cured yesterday.    He showed me additional ones under the dressing vacuum    Lab Results Lab Results  Component Value Date   WBC 8.9 02/13/2020   HGB 11.7 (L) 02/13/2020   HCT 36.3 (L) 02/13/2020   MCV 93.3 02/13/2020   PLT 248 02/13/2020    Lab Results  Component Value Date   CREATININE 0.69 02/13/2020   BUN 5 (L) 02/13/2020   NA 137 02/13/2020   K 4.0 02/13/2020   CL 102 02/13/2020   CO2 25 02/13/2020    Lab Results  Component Value Date   ALT 11 02/12/2020   AST 22 02/12/2020   ALKPHOS 103 02/12/2020   BILITOT 0.9 02/12/2020     Microbiology: Recent Results (from the past 240 hour(s))  SARS CORONAVIRUS 2 (TAT 6-24 HRS) Nasopharyngeal Nasopharyngeal Swab     Status: None   Collection Time: 02/12/20  2:17 PM   Specimen: Nasopharyngeal Swab  Result Value Ref Range Status   SARS Coronavirus 2 NEGATIVE NEGATIVE Final    Comment: (NOTE) SARS-CoV-2 target nucleic acids are NOT DETECTED. The SARS-CoV-2 RNA is generally detectable in upper and lower respiratory specimens during the acute phase of infection. Negative results do not preclude SARS-CoV-2 infection, do not rule out co-infections with other pathogens, and should not be used as the sole basis for treatment or other patient management decisions. Negative results must be combined with clinical observations, patient history, and epidemiological information. The expected result is Negative. Fact Sheet for Patients: HairSlick.no Fact Sheet for Healthcare Providers: quierodirigir.com This test is not yet approved or cleared by the [place] FDA and  has been authorized for detection and/or diagnosis of SARS-CoV-2 by FDA under an Emergency Use Authorization (EUA). This EUA will remain  in effect (meaning this test can be used) for the duration of the COVID-19 declaration under Section 56 4(b)(1) of the Act, 21  U.S.C. section 360bbb-3(b)(1), unless the authorization is  terminated or revoked sooner. Performed at [facility], [address], [place], [place] [address]   Culture, blood (routine x 2)     Status: None (Preliminary result)   Collection Time: 02/12/20  2:22 PM   Specimen: BLOOD RIGHT HAND  Result Value Ref Range Status   Specimen Description BLOOD RIGHT HAND  Final   Special Requests   Final    BOTTLES DRAWN AEROBIC AND ANAEROBIC Blood Culture adequate volume   Culture   Final    NO GROWTH < 24 HOURS Performed at [facility], [address], [place], [place] [address]    Report Status PENDING  Incomplete  Culture, blood (routine x 2)     Status: None (Preliminary result)   Collection Time: 02/12/20  3:37 PM   Specimen: BLOOD  Result Value Ref Range Status   Specimen Description BLOOD LEFT ANTECUBITAL  Final   Special Requests   Final    BOTTLES DRAWN AEROBIC AND ANAEROBIC Blood Culture results may not be optimal due to an inadequate volume of blood received in culture bottles   Culture   Final    NO GROWTH < 24 HOURS Performed at [facility], [address], [place], [place] [address]    Report Status PENDING  Incomplete  Surgical PCR screen     Status: None   Collection Time: 02/12/20  5:14 PM   Specimen: Nasal Mucosa; Nasal Swab  Result Value Ref Range Status   MRSA, PCR NEGATIVE NEGATIVE Final   Staphylococcus aureus NEGATIVE NEGATIVE Final    Comment: (NOTE) The Xpert SA Assay (FDA approved for NASAL specimens in patients 65 years of age and older), is one component of a comprehensive surveillance program. It is not intended to diagnose infection nor to guide or monitor treatment. Performed at [facility], [address] [place], [place] [address]     Acey Lav, MD [facility] for Infectious Disease [facility] [phone] pager  02/13/2020, 11:23 AM

## 2020-02-14 ENCOUNTER — Ambulatory Visit: Payer: No Typology Code available for payment source

## 2020-02-14 ENCOUNTER — Other Ambulatory Visit (HOSPITAL_COMMUNITY): Payer: No Typology Code available for payment source

## 2020-02-14 ENCOUNTER — Other Ambulatory Visit (HOSPITAL_COMMUNITY): Payer: Self-pay | Admitting: Cardiology

## 2020-02-14 DIAGNOSIS — B9561 Methicillin susceptible Staphylococcus aureus infection as the cause of diseases classified elsewhere: Secondary | ICD-10-CM

## 2020-02-14 DIAGNOSIS — G4709 Other insomnia: Secondary | ICD-10-CM

## 2020-02-14 DIAGNOSIS — Z95828 Presence of other vascular implants and grafts: Secondary | ICD-10-CM

## 2020-02-14 DIAGNOSIS — T827XXA Infection and inflammatory reaction due to other cardiac and vascular devices, implants and grafts, initial encounter: Secondary | ICD-10-CM

## 2020-02-14 DIAGNOSIS — Z91048 Other nonmedicinal substance allergy status: Secondary | ICD-10-CM

## 2020-02-14 DIAGNOSIS — B027 Disseminated zoster: Principal | ICD-10-CM

## 2020-02-14 DIAGNOSIS — R519 Headache, unspecified: Secondary | ICD-10-CM

## 2020-02-14 DIAGNOSIS — Z95811 Presence of heart assist device: Secondary | ICD-10-CM

## 2020-02-14 LAB — BASIC METABOLIC PANEL
Anion gap: 11 (ref 5–15)
BUN: 16 mg/dL (ref 6–20)
CO2: 24 mmol/L (ref 22–32)
Calcium: 9.4 mg/dL (ref 8.9–10.3)
Chloride: 100 mmol/L (ref 98–111)
Creatinine, Ser: 1.15 mg/dL (ref 0.61–1.24)
GFR calc Af Amer: >60 mL/min (ref >60)
GFR calc non Af Amer: >60 mL/min (ref >60)
Glucose, Bld: 148 mg/dL — ABNORMAL HIGH (ref 70–99)
Potassium: 4.1 mmol/L (ref 3.5–5.1)
Sodium: 135 mmol/L (ref 135–145)

## 2020-02-14 LAB — CBC
HCT: 33.4 % — ABNORMAL LOW (ref 39.0–52.0)
Hemoglobin: 10.8 g/dL — ABNORMAL LOW (ref 13.0–17.0)
MCH: 30.8 pg (ref 26.0–34.0)
MCHC: 32.3 g/dL (ref 30.0–36.0)
MCV: 95.2 fL (ref 80.0–100.0)
Platelets: 217 10*3/uL (ref 150–400)
RBC: 3.51 MIL/uL — ABNORMAL LOW (ref 4.22–5.81)
RDW: 13.9 % (ref 11.5–15.5)
WBC: 9.7 10*3/uL (ref 4.0–10.5)
nRBC: 0 % (ref 0.0–0.2)

## 2020-02-14 LAB — PROTIME-INR
INR: 1.6 — ABNORMAL HIGH (ref 0.8–1.2)
Prothrombin Time: 18.8 seconds — ABNORMAL HIGH (ref 11.4–15.2)

## 2020-02-14 LAB — GLUCOSE, CAPILLARY
Glucose-Capillary: 146 mg/dL — ABNORMAL HIGH (ref 70–99)
Glucose-Capillary: 116 mg/dL — ABNORMAL HIGH (ref 70–99)
Glucose-Capillary: 93 mg/dL (ref 70–99)
Glucose-Capillary: 126 mg/dL — ABNORMAL HIGH (ref 70–99)

## 2020-02-14 LAB — MAGNESIUM: Magnesium: 2 mg/dL (ref 1.7–2.4)

## 2020-02-14 LAB — LACTATE DEHYDROGENASE: LDH: 152 U/L (ref 98–192)

## 2020-02-14 MED ORDER — OXYCODONE HCL 5 MG PO TABS: 5.0000 mg | ORAL_TABLET | Freq: Four times a day (QID) | ORAL | 0 refills | Status: DC | PRN

## 2020-02-14 MED ORDER — TRAMADOL HCL 50 MG PO TABS: 100.0000 mg | ORAL_TABLET | Freq: Four times a day (QID) | ORAL | 0 refills | Status: DC | PRN

## 2020-02-14 MED ORDER — VALACYCLOVIR HCL 1 G PO TABS: 1000.0000 mg | ORAL_TABLET | Freq: Three times a day (TID) | ORAL | 0 refills | Status: AC

## 2020-02-14 MED ORDER — WARFARIN SODIUM 7.5 MG PO TABS
7.5000 mg | ORAL_TABLET | Freq: Once | ORAL | Status: AC
  Administered 2020-02-14: 7.5 mg via ORAL
  Filled 2020-02-14: qty 1

## 2020-02-14 MED ORDER — MUPIROCIN 2 % EX OINT: 1.0000 "application " | TOPICAL_OINTMENT | Freq: Two times a day (BID) | CUTANEOUS | 0 refills | Status: DC

## 2020-02-14 MED ORDER — VALACYCLOVIR HCL 1 G PO TABS: 1000.0000 mg | ORAL_TABLET | Freq: Three times a day (TID) | ORAL | 0 refills | Status: DC

## 2020-02-14 MED FILL — valACYclovir HCL 1 GM TABS: 1 | 7 days supply | Qty: 21 | Fill #0

## 2020-02-14 MED FILL — OXYCODONE HCL 5 MG TABS: 5 | 5 days supply | Qty: 20 | Fill #0

## 2020-02-14 MED FILL — traMADol HCL 50 MG TABS: 50 | 5 days supply | Qty: 20 | Fill #0

## 2020-02-14 NOTE — Progress Notes (Signed)
LVAD Coordinator Rounding Note:  Admitted 02/12/20 to Dr. Gala Romney for severe headache possibly due to disseminated zoster.   HM III LVAD implanted on 08/03/18 by Dr. Laneta Simmers under Destination Therapy criteria due to smoking.  Vital signs: Temp: 98.5 HR: 101 Doppler Pressure: not done Automatic BP: 106/56 (72) O2 Sat: 98% RA Wt:  180.3>178.7>178.5 lbs   LVAD interrogation reveals:  Speed: 5700 Flow: 4.4 Power:  4.4w PI: 5.0  Alarms: none Events:  >120 PI events today Hct: 33  Fixed speed: 5700 Low speed limit: 5400  Back up battery replaced on primary controller [id number] with [id number] with 13 months   Drive Line: wound vac in place on sternal and DL exit site with minimal dark bloody drainage. Scheduled wound vac change per Dr. Maren Beach, Thursday of this week.   Labs:  LDH trend: 175>152                         .  INR trend: 1.5>1.6  Anticoagulation Plan: -INR Goal:  2.0 - 2.5 -ASA Dose:  81 mg daily  Device: N/A  Infection:  - 02/12/20 blood culture>>pending  Plan/Recommendations:  1. Call VAD Coordinator for any LVAD equipment or drive line issues.  Hessie Diener RN, VAD Coordinator 24/7 VAD Pager: [phone]

## 2020-02-14 NOTE — TOC Transition Note (Addendum)
Transition of Care [facility]) - CM/SW Discharge Note   Patient Details  Name: Theodore Welch MRN: [id number] Date of Birth: Feb 21, 1961  Transition of Care [facility]) CM/SW Contact:  Epifanio Lesches, RN Phone Number: 02/14/2020, 3:58 PM   Clinical Narrative:   Readmitted with c/o HA, disseminated zoster. Previously admitted 3/9-3/24/2021 MSSA LVAD infection/ s/p I&d, hx of Chronic Systolic Heart Failure/ Nonischemic CardiomyopathyHeartmate 3 LVAD (implanted 07/2018). Pt will transition to home today with the resumption of HH services, [facility] and [facility]. Resides with fiance' Theodore Welch. States Theodore Welch is his caregiver and assists with his IV ABX THERAPY. States independent with ADL's. DME: cane, walker.    Bartolo Darter      [phone]      Theodore Welch is to provide pt with transportation to home.      FAX # [phone] (Dr. Mulles/PCP)   Final next level of care: Home w Home Health Services Barriers to Discharge: No Barriers Identified   Patient Goals and CMS Choice     Discharge Placement     Discharge Plan and Services     HH Arranged: RN [facility] Agency: [facility] and [facility] ([facility]) Date HH Agency Contacted: 02/14/20 Time HH Agency Contacted: 1556 Representative spoke with at [facility] Agency: Theodore Welch  Social Determinants of Health (SDOH) Interventions     Readmission Risk Interventions Readmission Risk Prevention Plan 04/05/2019 04/02/2019  Transportation Screening - Complete  PCP or Specialist Appt within 3-5 Days Not Complete -  Not Complete comments earliest appt given with HF/LVAD team -  HRI or Home Care Consult - Complete  Social Work Consult for Recovery Care Planning/Counseling - Complete  Palliative Care Screening - Not Applicable  Medication Review [employer]) - Complete  Some recent data might be hidden

## 2020-02-14 NOTE — Progress Notes (Signed)
Patient ID: Theodore Welch, male   DOB: 14-Sep-1961, 59 y.o.   MRN: [id number]         [facility] for Infectious Disease  Date of Admission:  02/12/2020           Day 20 MSSA IV antibiotics        Day 3 IV acyclovir ASSESSMENT: He seems to be doing well on intensified IV cefazolin therapy for breakthrough of his MSSA LVAD infection.  He also has acute dermatomal zoster.  It seems like therewere 1 or 2 lesions on his chest and abdomen.  In my experience it is not unusual to have a few satellite lesions with uncomplicated dermatomal zoster.  I doubt that this is true disseminated zoster and am comfortable switching him to oral valacyclovir and discharging him home to complete therapy.  PLAN: 1. Change IV acyclovir to oral Valacyclovir 1000 mg 3 times daily for 7 more days 2. Continue IV cefazolin  Diagnosis: MSSA Infection complicating LVAD   Culture Result: MSSA        Allergies  Allergen Reactions  . Chlorhexidine Rash  . Other Rash    Prep pads    OPAT Orders Discharge antibiotics: Cefazolin  Per pharmacy protocol  Aim for Vancomycin trough 15-20 or AUC 400-550 (unless otherwise indicated) Duration: 8 weeks  End Date: 03/27/20  [facility] Care Per Protocol:  Home health RN for IV administration and teaching; PICC line care and labs.   Labs weekly while on IV antibiotics: _X_ CBC with differential _X_ BMP __ CMP _X_ CRP _X_ ESR __ Vancomycin trough __ CK  __ Please pull PIC at completion of IV antibiotics _X_ Please leave PIC in place until doctor has seen patient or been notified  Fax weekly labs to [phone]  Clinic Follow Up Appt:  03/01/20 at 2:30 pm with Theodore Madeira, NP    Principal Problem:   Disseminated zoster Active Problems:   Infection associated with driveline of left ventricular assist device (LVAD) ([place])   LVAD (left ventricular assist device) present ([place])   Complication involving left ventricular assist device  (LVAD)   Chronic systolic (congestive) heart failure (HCC)   Headache   Scheduled Meds: . amLODipine  10 mg Oral Daily  . carbamide peroxide  5 drop Both EARS TID  . gabapentin  600 mg Oral TID  . hydrALAZINE  50 mg Oral TID  . insulin aspart  0-15 Units Subcutaneous TID WC  . mupirocin ointment  1 application Nasal BID  . nystatin  1 application Topical TID  . pantoprazole  40 mg Oral Daily  . sacubitril-valsartan  1 tablet Oral BID  . sodium chloride flush  10-40 mL Intracatheter Q12H  . spironolactone  25 mg Oral Daily  . thiamine  100 mg Oral Daily  . warfarin  7.5 mg Oral ONCE-1600  . Warfarin - Pharmacist Dosing Inpatient   Does not apply q1800  . zinc sulfate  220 mg Oral Daily   Continuous Infusions: . sodium chloride Stopped (02/14/20 1141)  . acyclovir 166 mL/hr at 02/14/20 1200  .  ceFAZolin (ANCEF) IV Stopped (02/14/20 0635)   PRN Meds:.sodium chloride, acetaminophen, docusate sodium, fluticasone, ipratropium-albuterol, ondansetron (ZOFRAN) IV, oxyCODONE, sodium chloride flush, traMADol, zolpidem   SUBJECTIVE: Theodore Welch is feeling better.  His headache is not as severe and he feels like his rash is drying up.  He has not noted any new skin lesions since admission.  He has not had any problems tolerating his PICC  or cefazolin.  Review of Systems: Review of Systems  Constitutional: Negative for fever.  Gastrointestinal: Negative for abdominal pain, diarrhea, nausea and vomiting.  Skin: Positive for itching and rash.  Neurological: Positive for headaches.    Allergies  Allergen Reactions  . Chlorhexidine Rash  . Other Rash    Prep pads    OBJECTIVE: Vitals:   02/14/20 0900 02/14/20 1000 02/14/20 1100 02/14/20 1212  BP:  111/83 (!) 115/103   Pulse: (!) 111 69 75   Resp: 19 18 (!) 22   Temp:    98.5 F (36.9 C)  TempSrc:      SpO2: 100% 100% 99%   Weight:      Height:       Body mass index is 25.62 kg/m.  Physical Exam Constitutional:       Comments: He is very pleasant and in good spirits as usual.  Skin:    Comments: The skin lesions on the left side of his scalp, neck and face scabbed over.  I do not see any new vesicles.  There is dry skin excoriations on his right forearm which I believe are due to tape reaction at a previous IV site.     Lab Results Lab Results  Component Value Date   WBC 9.7 02/14/2020   HGB 10.8 (L) 02/14/2020   HCT 33.4 (L) 02/14/2020   MCV 95.2 02/14/2020   PLT 217 02/14/2020    Lab Results  Component Value Date   CREATININE 1.15 02/14/2020   BUN 16 02/14/2020   NA 135 02/14/2020   K 4.1 02/14/2020   CL 100 02/14/2020   CO2 24 02/14/2020    Lab Results  Component Value Date   ALT 11 02/12/2020   AST 22 02/12/2020   ALKPHOS 103 02/12/2020   BILITOT 0.9 02/12/2020     Microbiology: Recent Results (from the past 240 hour(s))  SARS CORONAVIRUS 2 (TAT 6-24 HRS) Nasopharyngeal Nasopharyngeal Swab     Status: None   Collection Time: 02/12/20  2:17 PM   Specimen: Nasopharyngeal Swab  Result Value Ref Range Status   SARS Coronavirus 2 NEGATIVE NEGATIVE Final    Comment: (NOTE) SARS-CoV-2 target nucleic acids are NOT DETECTED. The SARS-CoV-2 RNA is generally detectable in upper and lower respiratory specimens during the acute phase of infection. Negative results do not preclude SARS-CoV-2 infection, do not rule out co-infections with other pathogens, and should not be used as the sole basis for treatment or other patient management decisions. Negative results must be combined with clinical observations, patient history, and epidemiological information. The expected result is Negative. Fact Sheet for Patients: SugarRoll.be Fact Sheet for Healthcare Providers: https://www.woods-mathews.com/ This test is not yet approved or cleared by the [place] FDA and  has been authorized for detection and/or diagnosis of SARS-CoV-2 by FDA under an  Emergency Use Authorization (EUA). This EUA will remain  in effect (meaning this test can be used) for the duration of the COVID-19 declaration under Section 56 4(b)(1) of the Act, 21 U.S.C. section 360bbb-3(b)(1), unless the authorization is terminated or revoked sooner. Performed at [facility], [place] [address], [place], [place] [address]   Culture, blood (routine x 2)     Status: None (Preliminary result)   Collection Time: 02/12/20  2:22 PM   Specimen: BLOOD RIGHT HAND  Result Value Ref Range Status   Specimen Description BLOOD RIGHT HAND  Final   Special Requests   Final    BOTTLES DRAWN AEROBIC  AND ANAEROBIC Blood Culture adequate volume   Culture NO GROWTH 2 DAYS  Final   Report Status PENDING  Incomplete  Culture, blood (routine x 2)     Status: None (Preliminary result)   Collection Time: 02/12/20  3:37 PM   Specimen: BLOOD  Result Value Ref Range Status   Specimen Description BLOOD LEFT ANTECUBITAL  Final   Special Requests   Final    BOTTLES DRAWN AEROBIC AND ANAEROBIC Blood Culture results may not be optimal due to an inadequate volume of blood received in culture bottles   Culture NO GROWTH 2 DAYS  Final   Report Status PENDING  Incomplete  Surgical PCR screen     Status: None   Collection Time: 02/12/20  5:14 PM   Specimen: Nasal Mucosa; Nasal Swab  Result Value Ref Range Status   MRSA, PCR NEGATIVE NEGATIVE Final   Staphylococcus aureus NEGATIVE NEGATIVE Final    Comment: (NOTE) The Xpert SA Assay (FDA approved for NASAL specimens in patients 50 years of age and older), is one component of a comprehensive surveillance program. It is not intended to diagnose infection nor to guide or monitor treatment. Performed at [facility], [place] [address] [place], [place] [address]     Michel Bickers, [place] [facility] [phone] pager   [phone] cell 02/14/2020, 1:57 PM

## 2020-02-14 NOTE — Progress Notes (Signed)
Patient Alert and oriented to baseline. Stable and ambulatory to baseline. Patient verbalized understanding of the discharge instructions.  Patient belongings were taken by the patient. PICC in place with recent dressing change today. Discharging with PICC per orders. Time allowed for questions by patient.

## 2020-02-14 NOTE — Progress Notes (Addendum)
Patient had frequent PI events from midnight to 2am. LVAD numbers were good throughout the night and patient was asymptomatic. Patient was moving around a lot during these hours.   BP has been on the softer side tonight, MAP has been >65. Hydralazine was not given. Patient has been urinating over 1.5 L per shift since admission. RN attempted but could not get an accurate CVP reading.    0715: Able to get CVP reading. CVP 3

## 2020-02-14 NOTE — Plan of Care (Signed)

## 2020-02-14 NOTE — Progress Notes (Signed)
  Subjective: Wound vac drainage minimal Will change vac sponges thurs am at bedside  Objective: Vital signs in last 24 hours: Temp:  [98.2 F (36.8 C)-98.8 F (37.1 C)] 98.5 F (36.9 C) (03/29 1212) Pulse Rate:  [29-111] 75 (03/29 1100) Cardiac Rhythm: Normal sinus rhythm (03/29 1200) Resp:  [15-37] 22 (03/29 1100) BP: (79-115)/(50-103) 115/103 (03/29 1100) SpO2:  [94 %-100 %] 99 % (03/29 1100) Weight:  [81 kg] 81 kg (03/29 0500)  Hemodynamic parameters for last 24 hours: CVP:  [3 mmHg-7 mmHg] 7 mmHg  Intake/Output from previous day: 03/28 0701 - 03/29 0700 In: 1411.8 [P.O.:520; I.V.:50.6; IV Piggyback:841.2] Out: 4400 [Urine:4400] Intake/Output this shift: Total I/O In: 1197.9 [P.O.:820; I.V.:46.7; IV Piggyback:331.2] Out: 1300 [Urine:1300]  Normal VAD hum nsr Lungs clear  Lab Results: Recent Labs    02/13/20 0540 02/14/20 0400  WBC 8.9 9.7  HGB 11.7* 10.8*  HCT 36.3* 33.4*  PLT 248 217   BMET:  Recent Labs    02/13/20 0540 02/14/20 0400  NA 137 135  K 4.0 4.1  CL 102 100  CO2 25 24  GLUCOSE 136* 148*  BUN 5* 16  CREATININE 0.69 1.15  CALCIUM 9.7 9.4    PT/INR:  Recent Labs    02/14/20 0400  LABPROT 18.8*  INR 1.6*   ABG    Component Value Date/Time   PHART 7.625 (HH) 01/25/2020 2108   HCO3 23.6 01/25/2020 2108   TCO2 26 08/04/2018 1622   O2SAT 97.9 01/25/2020 2108   CBG (last 3)  Recent Labs    02/14/20 0643 02/14/20 1137 02/14/20 1200  GLUCAP 146* 93 116*    Assessment/Plan: S/P drive line tunnel debridement MSSA growing from surgical specimen   LOS: 2 days    Theodore Welch 02/14/2020

## 2020-02-14 NOTE — Progress Notes (Signed)
PHARMACY CONSULT NOTE FOR:  OUTPATIENT  PARENTERAL ANTIBIOTIC THERAPY (OPAT)  Indication: MSSA LVAD infection Regimen: Cefazolin (Ancef) 2g IV q8h End date: 03/27/2020  IV antibiotic discharge orders are pended. To discharging provider:  please sign these orders via discharge navigator,  Select New Orders & click on the button choice - Manage This Unsigned Work.     Thank you for allowing pharmacy to be a part of this patient's care.  Trixie Rude, PharmD Candidate  02/14/2020, 2:27 PM

## 2020-02-14 NOTE — Progress Notes (Signed)
Advanced Heart Failure VAD Team Note  PCP-Cardiologist: No primary care provider on file.   Subjective:    Remains on IV acyclovir. Afebrile. Still with pain and HA. No other neuro signs/sx.   LVAD INTERROGATION:  HeartMate 3 LVAD:   Flow 5.0 liters/min, speed 5700, power 4.0, PI 3.1  Frequent PI events  Objective:    Vital Signs:   Temp:  [98.2 F (36.8 C)-98.8 F (37.1 C)] 98.2 F (36.8 C) (03/29 0810) Pulse Rate:  [29-106] 86 (03/29 0818) Resp:  [15-37] 16 (03/29 0818) BP: (79-106)/(50-89) 106/56 (03/29 0818) SpO2:  [93 %-100 %] 100 % (03/29 0818) Weight:  [81 kg] 81 kg (03/29 0500) Last BM Date: 02/10/20 Mean arterial Pressure 70-80s  Intake/Output:   Intake/Output Summary (Last 24 hours) at 02/14/2020 0918 Last data filed at 02/14/2020 0900 Gross per 24 hour  Intake 1645.75 ml  Output 4325 ml  Net -2679.25 ml     Physical Exam    General:  Well appearing. No resp difficulty HEENT: normal + vesicular rash on occiput and behind left ear  General:  NAD.  Cor: LVAD hum. Wound vac in place Lungs: Clear. Abdomen: obese soft, nontender, non-distended. No hepatosplenomegaly. No bruits or masses. Good bowel sounds. Driveline site clean. Anchor in place.  Extremities: no cyanosis, clubbingWarm no edema  Scattered vessicles on chest and arms Neuro: alert & oriented x 3. No focal deficits. Moves all 4 without problem   Telemetry   NSR 80-90s Personally reviewed  Labs   Basic Metabolic Panel: Recent Labs  Lab 02/08/20 0357 02/08/20 0357 02/09/20 0500 02/09/20 0500 02/12/20 1128 02/13/20 0540 02/14/20 0400  NA 141  --  140  --  137 137 135  K 4.1  --  3.9  --  4.1 4.0 4.1  CL 107  --  105  --  102 102 100  CO2 27  --  26  --  24 25 24   GLUCOSE 102*  --  110*  --  118* 136* 148*  BUN 6  --  <5*  --  <5* 5* 16  CREATININE 0.83  --  0.75  --  0.66 0.69 1.15  CALCIUM 9.2   < > 9.2   < > 9.8 9.7 9.4  MG  --   --   --   --   --   --  2.0   < > = values in  this interval not displayed.    Liver Function Tests: Recent Labs  Lab 02/12/20 1128  AST 22  ALT 11  ALKPHOS 103  BILITOT 0.9  PROT 7.9  ALBUMIN 3.7   No results for input(s): LIPASE, AMYLASE in the last 168 hours. No results for input(s): AMMONIA in the last 168 hours.  CBC: Recent Labs  Lab 02/08/20 0357 02/09/20 0500 02/12/20 1128 02/13/20 0540 02/14/20 0400  WBC 7.4 6.5 8.4 8.9 9.7  NEUTROABS  --   --  4.6  --   --   HGB 9.5* 9.3* 11.3* 11.7* 10.8*  HCT 29.9* 29.6* 35.5* 36.3* 33.4*  MCV 95.8 95.5 94.9 93.3 95.2  PLT 331 266 217 248 217    INR: Recent Labs  Lab 02/08/20 1300 02/09/20 0500 02/12/20 1128 02/13/20 0540 02/14/20 0400  INR 1.6* 2.0* 1.7* 1.5* 1.6*    Other results:   Imaging   No results found.   Medications:     Scheduled Medications: . amLODipine  10 mg Oral Daily  . carbamide peroxide  5 drop  Both EARS TID  . gabapentin  600 mg Oral TID  . hydrALAZINE  50 mg Oral TID  . insulin aspart  0-15 Units Subcutaneous TID WC  . mupirocin ointment  1 application Nasal BID  . nystatin  1 application Topical TID  . pantoprazole  40 mg Oral Daily  . sacubitril-valsartan  1 tablet Oral BID  . sodium chloride flush  10-40 mL Intracatheter Q12H  . spironolactone  25 mg Oral Daily  . thiamine  100 mg Oral Daily  . Warfarin - Pharmacist Dosing Inpatient   Does not apply q1800  . zinc sulfate  220 mg Oral Daily    Infusions: . sodium chloride 10 mL/hr at 02/14/20 0800  . acyclovir 800 mg (02/14/20 0403)  .  ceFAZolin (ANCEF) IV Stopped (02/14/20 8921)    PRN Medications: sodium chloride, acetaminophen, docusate sodium, fluticasone, ipratropium-albuterol, ondansetron (ZOFRAN) IV, oxyCODONE, sodium chloride flush, traMADol, zolpidem   Assessment/Plan:    1. Headaches and fevers -> suspect disseminated zoster - head CT negative 3/21 - acyclovir started. No meningeal signs on exam - ID input appreciated. Continue IV acyclovir for  several more days then switch to po  - contact and airborne precautions in place. Continue to lesions crust over - BCx NGTD  2. Sternal Abscess: - s/p derbridement on 3/10 - cx + for MSSA - on Ancef - wound vac in place stable - Afebrile. WBC ok   3. LVAD:  - VAD interrogated personally. Parameters stable. - INR 1.6 Discussed dosing with PharmD personally. No need for IV heparin at this point - LDH 152 - Has frequent PI events which is chronic. Can give some IVF as needed  4. 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 LVAD parameters.NYHA class I-II. He does not look volume overloaded. Continue home regimen.  - Maps ok  - volume status stable - Continue spironolactone 25 mg daily.  -ContinueEntresto to 97/103 bid.  - Should be transplant candidate eventually if he can quit smoking. Will make transplant clinic referral when he is off totally.   5. H/o RLE DVT: On warfarin for LVAD.  - INR dosing at above  6. OSA:  - Continue CPAP qhs.   7. Hyperlipidemia:  - Continue Atorvastatin 20 qhs  8. Type II diabetes:  - cover with SSI   Length of Stay: 2  Glori Bickers, MD 02/14/2020, 9:18 AM  VAD Team --- VAD ISSUES ONLY--- Pager [phone] (7am - 7am)  Advanced Heart Failure Team  Pager [phone] (M-F; 7a - 4p)  Please contact [facility] for night-coverage after hours (4p -7a ) and weekends on [email or web address]

## 2020-02-14 NOTE — Progress Notes (Signed)
ANTICOAGULATION CONSULT NOTE  Pharmacy Consult for Warfarin Indication: LVAD  Allergies  Allergen Reactions  . Chlorhexidine Rash  . Other Rash    Prep pads    Patient Measurements: Height: 5\' 10"  (177.8 cm) Weight: 178 lb 9.2 oz (81 kg)(RN held wound vac and LVAD) IBW/kg (Calculated) : 73 Heparin Dosing Weight: 85 kg  Vital Signs: Temp: 98.2 F (36.8 C) (03/29 0810) Temp Source: Oral (03/29 0810) BP: 106/56 (03/29 0818) Pulse Rate: 86 (03/29 0818)  Labs: Recent Labs    02/12/20 1128 02/12/20 1128 02/13/20 0540 02/14/20 0400  HGB 11.3*   < > 11.7* 10.8*  HCT 35.5*  --  36.3* 33.4*  PLT 217  --  248 217  LABPROT 20.2*  --  18.4* 18.8*  INR 1.7*  --  1.5* 1.6*  CREATININE 0.66  --  0.69 1.15   < > = values in this interval not displayed.    Estimated Creatinine Clearance: 70.5 mL/min (by C-G formula based on SCr of 1.15 mg/dL).   Medical History: Past Medical History:  Diagnosis Date  . 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 Cardiomyopathy, unspecified (HCC)   . CHF (congestive heart failure) (HCC)   . Chronic back pain   . Diabetes mellitus without complication (HCC)   . Enlarged heart   . Gastric ulcer   . Gastroenteritis   . H/O degenerative disc disease   . Hypertension   . LVAD (left ventricular assist device) present [facility])     Assessment: 58yom presenting with headache and possible shingles, he is on chronic antibiotics for sternal abscess recently debrided. On warfarin PTA for hx LVAD (goal 2-2.5).   Recent discharge regimen was warfarin 7.5mg  TTSa and 5mg  all other days.  Hgb stable at 10.8, plt 217. LDH stable 152.  INR up slightly but below goal this morning at 1.6, warfarin restarted on 3/28 (missed dose accidentally on 3/27)- hold on IV heparin for now. No s/sx of bleeding.   Goal of Therapy:  INR 2-2.5 Monitor platelets by anticoagulation protocol: Yes   Plan:  Warfarin 7.5 mg tonight Daily INR  4/28, PharmD,  BCCCP Clinical Pharmacist  Phone: [phone]  Please check AMION for all [facility] phone numbers After 10:00 PM, call Main Pharmacy [phone] 02/14/2020 9:17 AM

## 2020-02-14 NOTE — Discharge Summary (Addendum)
Advanced Heart Failure Team  Discharge Summary   Patient ID: Theodore Welch MRN: [id number], DOB/AGE: September 25, 1961 59 y.o. Admit date: 02/12/2020 D/C date:     02/14/2020   Primary Discharge Diagnoses:  1. Headaches and fevers -> Disseminated Zoster  2. Sternal Abscess 3. LVAD 4. Chronic Systolic Heart Failure  5. H/O RLE DVT 6. OSA 7. Hyperlipemia  8. Type II DM   Hospital Course:  Mr [name] is a 59 year old with history of NICM, RLE DVT, cirrhosis, smoking, OSA, chronic systolic heart failure with HMIII VAD.   Admitted worsening headache in back of scalp and behind ear. ER afebrile but found to have vesicular rash on back of head behind left ear and also isolated spots on his arms (see photos in Media tab). ID consulted and felt to possibly have disseminated zoster. Started on IV acyclovir. He gradually improved and was switched to valtrex 1 gram tid for 7 days.   From HF/LVAD perspective he was stable. INR was 1.6 on the day of discharge. Placed on higher dose of coumadin. Check INR in the VAD clinic on Thursday.   He will continue home IV antibiotic regimen.   Evaluated by Dr Megan Salon, Dr Lawson Fiscal and Dr Haroldine Laws and deemed stable for discharge.   See below for detailed problem list.   1. Headaches and fevers -> suspect disseminated zoster - head CT negative 3/21 - acyclovir started. No meningeal signs on exam - ID Consulted and input appreciated. Placed on IV acyclovir then switched to valtrex 1 gram tid for 7 more days.  Lesions crusted  - BCx NGTD   2. Sternal Abscess: - s/p derbridement on 3/10 - cx + for MSSA - on Ancef- Continue until 03/27/20 - wound vac in place stable. Dressing changes per Dr Darcey Nora  - Afebrile. WBC ok    3. LVAD:  - VAD interrogated personally. Parameters stable. - INR 1.6 . Repeat INR on Thursday in [facility].  - Discussed dosing with PharmD personally. No need for IV heparin at this point - LDH 152 - Has frequent PI events which is  chronic. Can give some IVF as needed   4. 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  LVAD parameters. NYHA class I-II.   He does not look volume overloaded. Continue home regimen.    - Maps ok  - volume status stable - Continue spironolactone 25 mg daily.  - Continue Entresto to 97/103 bid.  - Should be transplant candidate eventually if he can quit smoking.  Will make transplant clinic referral when he is off totally.    5. H/o RLE DVT: On warfarin for LVAD.  - INR dosing at above   6. OSA:  - Continue CPAP qhs.   7. Hyperlipidemia:  - Continue Atorvastatin 20 qhs   8. Type II diabetes:  - cover with SSI   LVAD Interrogation HM III:   Speed: 5700    Flow: 4.6      PI: 4.2      Power:   4    Back-up speed:    5700     Discharge Vitals: Blood pressure (!) 106/92, pulse (!) 101, temperature 98.5 F (36.9 C), resp. rate (!) 28, height '5\' 10"'$  (1.778 m), weight 81 kg, SpO2 98 %.  Labs: Lab Results  Component Value Date   WBC 9.7 02/14/2020   HGB 10.8 (L) 02/14/2020   HCT 33.4 (L) 02/14/2020   MCV 95.2 02/14/2020  PLT 217 02/14/2020    Recent Labs  Lab 02/12/20 1128 02/13/20 0540 02/14/20 0400  NA 137   < > 135  K 4.1   < > 4.1  CL 102   < > 100  CO2 24   < > 24  BUN <5*   < > 16  CREATININE 0.66   < > 1.15  CALCIUM 9.8   < > 9.4  PROT 7.9  --   --   BILITOT 0.9  --   --   ALKPHOS 103  --   --   ALT 11  --   --   AST 22  --   --   GLUCOSE 118*   < > 148*   < > = values in this interval not displayed.   Lab Results  Component Value Date   CHOL 97 07/21/2018   HDL 32 (L) 07/21/2018   LDLCALC 57 07/21/2018   TRIG 39 07/21/2018   BNP (last 3 results) No results for input(s): BNP in the last 8760 hours.  ProBNP (last 3 results) No results for input(s): PROBNP in the last 8760 hours.   Diagnostic Studies/Procedures   No results found.  Discharge Medications   Allergies as of 02/14/2020       Reactions    Chlorhexidine Rash   Other Rash   Prep pads        Medication List     TAKE these medications    amLODipine 5 MG tablet Commonly known as: NORVASC Take 2 tablets (10 mg total) by mouth daily.   aspirin EC 81 MG tablet Take 81 mg by mouth daily.   carbamide peroxide 6.5 % OTIC solution Commonly known as: DEBROX Place 5 drops into both ears 3 (three) times daily. What changed: when to take this   ceFAZolin  IVPB Commonly known as: ANCEF Inject 2 g into the vein every 8 (eight) hours. Indication:  MSSA LVAD infection Last Day of Therapy:  03/27/2020 Labs - Once weekly:  CBC/D and BMP, Labs - Every other week:  ESR and CRP   Chantix Continuing Month Pak 1 MG tablet Generic drug: varenicline Take 1 mg by mouth daily.   docusate sodium 100 MG capsule Commonly known as: COLACE Take 2 capsules (200 mg total) by mouth daily as needed for mild constipation.   fluticasone 50 MCG/ACT nasal spray Commonly known as: FLONASE Place 1 spray into both nostrils 2 (two) times daily as needed for allergies.   freestyle lancets Use as instructed   gabapentin 300 MG capsule Commonly known as: NEURONTIN Take 600 mg by mouth 3 (three) times daily.   glucose blood test strip Commonly known as: Contour Next Test Use as instructed   hydrALAZINE 50 MG tablet Commonly known as: APRESOLINE Take 1 tablet (50 mg total) by mouth 3 (three) times daily.   Ipratropium-Albuterol 20-100 MCG/ACT Aers respimat Commonly known as: COMBIVENT Inhale 1 puff into the lungs every 6 (six) hours as needed for wheezing or shortness of breath.   mupirocin ointment 2 % Commonly known as: BACTROBAN Place 1 application into the nose 2 (two) times daily for 2 days.   nystatin powder Commonly known as: MYCOSTATIN/NYSTOP Apply 1 application topically 3 (three) times daily. What changed: additional instructions   oxyCODONE 5 MG immediate release tablet Commonly known as: Oxy IR/ROXICODONE Take 1 tablet  (5 mg total) by mouth every 6 (six) hours as needed for severe pain. What changed:  when to take this reasons  to take this   pantoprazole 40 MG tablet Commonly known as: PROTONIX Take 1 tablet (40 mg total) by mouth daily.   sacubitril-valsartan 97-103 MG Commonly known as: ENTRESTO Take 1 tablet by mouth 2 (two) times daily.   sildenafil 100 MG tablet Commonly known as: VIAGRA Take 50 mg by mouth daily as needed for erectile dysfunction.   spironolactone 25 MG tablet Commonly known as: ALDACTONE Take 1 tablet (25 mg total) by mouth daily.   thiamine 100 MG tablet Take 1 tablet (100 mg total) by mouth daily.   traMADol 50 MG tablet Commonly known as: ULTRAM Take 2 tablets (100 mg total) by mouth every 6 (six) hours as needed for severe pain. What changed: how much to take   valACYclovir 1000 MG tablet Commonly known as: Valtrex Take 1 tablet (1,000 mg total) by mouth 3 (three) times daily for 7 days.   vitamin B-12 500 MCG tablet Commonly known as: CYANOCOBALAMIN Take 1,000 mcg by mouth daily.   warfarin 5 MG tablet Commonly known as: COUMADIN Take as directed. If you are unsure how to take this medication, talk to your nurse or doctor. Original instructions: TAKE 1 TABLET BY MOUTH DAILY EXCEPT 1 AND 1/2 TABLETS ON MONDAY, WEDNESDAY AND FRIDAY AS DIRECTED What changed: See the new instructions.   Zinc Gluconate 100 MG Tabs Take 1 tablet (100 mg total) by mouth daily. What changed: when to take this   zolpidem 5 MG tablet Commonly known as: Ambien Take 1 tablet (5 mg total) by mouth at bedtime.               Home Infusion Instuctions  (From admission, onward)           Start     Ordered   02/14/20 0000  Home infusion instructions    Question:  Instructions  Answer:  Flushing of vascular access device: 0.9% NaCl pre/post medication administration and prn patency; Heparin 100 u/ml, 28m for implanted ports and Heparin 10u/ml, 56109mfor all other central  venous catheters.   02/14/20 1535            Disposition   The patient will be discharged in stable condition to home. Discharge Instructions     (HEART FAILURE PATIENTS) Call MD:  Anytime you have any of the following symptoms: 1) 3 pound weight gain in 24 hours or 5 pounds in 1 week 2) shortness of breath, with or without a dry hacking cough 3) swelling in the hands, feet or stomach 4) if you have to sleep on extra pillows at night in order to breathe.   Complete by: As directed    Diet - low sodium heart healthy   Complete by: As directed    Heart Failure patients record your daily weight using the same scale at the same time of day   Complete by: As directed    Home infusion instructions   Complete by: As directed    Instructions: Flushing of vascular access device: 0.9% NaCl pre/post medication administration and prn patency; Heparin 100 u/ml, 109m44mor implanted ports and Heparin 10u/ml, 109ml16mr all other central venous catheters.   INR  Goal: 2 - 2.5   Complete by: As directed    Goal: 2 - 2.5   Increase activity slowly   Complete by: As directed    Page VAD Coordinator at [phone] for: any VAD alarms, sustained elevations of power >10 watts, sustained drop in Pulse Index <3  Complete by: As directed    Notify for:  any VAD alarms sustained elevations of power >10 watts sustained drop in Pulse Index <3     Speed Settings:   Complete by: As directed    Fixed 5700 RPM Low 5300 RPM           Duration of Discharge Encounter: Greater than 35 minutes   Signed, Amy Clegg NP-C  02/14/2020, 3:38 PM  Patient seen and examined with the above-signed Advanced Practice Provider and/or Housestaff. I personally reviewed laboratory data, imaging studies and relevant notes. I independently examined the patient and formulated the important aspects of the plan. I have edited the note to reflect any of my changes or salient points. I have personally discussed the plan  with the patient and/or family.  Case d/w ID team and feel he is stable for discharge with oral antivirals for zoster. Will d/c today and follow closely in [facility].  Glori Bickers, MD  11:10 PM

## 2020-02-15 ENCOUNTER — Other Ambulatory Visit: Payer: Self-pay | Admitting: *Deleted

## 2020-02-15 ENCOUNTER — Other Ambulatory Visit (HOSPITAL_COMMUNITY): Payer: Self-pay | Admitting: *Deleted

## 2020-02-15 DIAGNOSIS — B369 Superficial mycosis, unspecified: Secondary | ICD-10-CM

## 2020-02-15 DIAGNOSIS — Z95811 Presence of heart assist device: Secondary | ICD-10-CM

## 2020-02-15 LAB — VARICELLA-ZOSTER BY PCR: Varicella-Zoster, PCR: POSITIVE — AB

## 2020-02-15 MED ORDER — MUPIROCIN 2 % EX OINT: 1.0000 "application " | TOPICAL_OINTMENT | Freq: Two times a day (BID) | CUTANEOUS | 0 refills | Status: AC

## 2020-02-15 MED ORDER — NYSTATIN 100000 UNIT/GM EX POWD: 1.0000 "application " | Freq: Three times a day (TID) | CUTANEOUS | 3 refills | Status: DC

## 2020-02-16 ENCOUNTER — Other Ambulatory Visit (HOSPITAL_COMMUNITY): Payer: Self-pay | Admitting: *Deleted

## 2020-02-16 ENCOUNTER — Other Ambulatory Visit (HOSPITAL_COMMUNITY): Payer: Self-pay | Admitting: Cardiology

## 2020-02-16 ENCOUNTER — Other Ambulatory Visit: Payer: Self-pay

## 2020-02-16 ENCOUNTER — Encounter (HOSPITAL_COMMUNITY): Payer: Self-pay

## 2020-02-16 ENCOUNTER — Ambulatory Visit (HOSPITAL_COMMUNITY)
Admission: RE | Admit: 2020-02-16 | Discharge: 2020-02-16 | Disposition: A | Discharge status: Home / Self Care | Payer: No Typology Code available for payment source | Source: Ambulatory Visit | Attending: Internal Medicine | Admitting: Internal Medicine

## 2020-02-16 ENCOUNTER — Ambulatory Visit (HOSPITAL_COMMUNITY): Payer: Self-pay | Admitting: Pharmacist

## 2020-02-16 DIAGNOSIS — T827XXA Infection and inflammatory reaction due to other cardiac and vascular devices, implants and grafts, initial encounter: Secondary | ICD-10-CM

## 2020-02-16 DIAGNOSIS — Z95811 Presence of heart assist device: Secondary | ICD-10-CM

## 2020-02-16 DIAGNOSIS — Z7901 Long term (current) use of anticoagulants: Secondary | ICD-10-CM

## 2020-02-16 DIAGNOSIS — Z4801 Encounter for change or removal of surgical wound dressing: Secondary | ICD-10-CM | POA: Insufficient documentation

## 2020-02-16 DIAGNOSIS — G4709 Other insomnia: Secondary | ICD-10-CM

## 2020-02-16 LAB — BASIC METABOLIC PANEL
Anion gap: 11 (ref 5–15)
BUN: 9 mg/dL (ref 6–20)
CO2: 23 mmol/L (ref 22–32)
Calcium: 9.3 mg/dL (ref 8.9–10.3)
Chloride: 101 mmol/L (ref 98–111)
Creatinine, Ser: 0.99 mg/dL (ref 0.61–1.24)
GFR calc Af Amer: >60 mL/min (ref >60)
GFR calc non Af Amer: >60 mL/min (ref >60)
Glucose, Bld: 93 mg/dL (ref 70–99)
Potassium: 4.4 mmol/L (ref 3.5–5.1)
Sodium: 135 mmol/L (ref 135–145)

## 2020-02-16 LAB — CBC
HCT: 34.9 % — ABNORMAL LOW (ref 39.0–52.0)
Hemoglobin: 11 g/dL — ABNORMAL LOW (ref 13.0–17.0)
MCH: 30.6 pg (ref 26.0–34.0)
MCHC: 31.5 g/dL (ref 30.0–36.0)
MCV: 96.9 fL (ref 80.0–100.0)
Platelets: 234 10*3/uL (ref 150–400)
RBC: 3.6 MIL/uL — ABNORMAL LOW (ref 4.22–5.81)
RDW: 13.7 % (ref 11.5–15.5)
WBC: 9.3 10*3/uL (ref 4.0–10.5)
nRBC: 0 % (ref 0.0–0.2)

## 2020-02-16 LAB — PROTIME-INR
INR: 1.7 — ABNORMAL HIGH (ref 0.8–1.2)
Prothrombin Time: 20.3 seconds — ABNORMAL HIGH (ref 11.4–15.2)

## 2020-02-16 LAB — LACTATE DEHYDROGENASE: LDH: 190 U/L (ref 98–192)

## 2020-02-16 MED ORDER — WARFARIN SODIUM 5 MG PO TABS: ORAL_TABLET | ORAL | 3 refills | Status: DC

## 2020-02-16 NOTE — Progress Notes (Addendum)
Pt presented to VAD clinic with c/o alarms on wound vac that started at 3:30 am this am. It was alarming "leak" and had no suction. It also alarmed "canister full"; pt changed canister as advised. Wound vac continued to alarm intermittently about a leak and continued to have no suction.  Attempted to secure all edges of current dressing with no improvement in suction.  Dr. Maren Beach to clinic for full wound vac change. Supplies obtained from OR.  Dr. Maren Beach performed wound vac change using sterile technique. Sternal wound cleaned with betadine swabs, found to have several clots; removed by Dr. Maren Beach.  Acell 500 gm applied in wound, covered by Mepitel dressing, sterile gel, and saline soaked 2 x 2's. Covered with wound VAC with immediate suction of 125 mmHg.   DL exit site care per Dr. Maren Beach. Site cleaned with betadine swabs, evidence of Acell tissue in site, covered with sterile gel, saline soaked 2 x 2's and covered with sterile gauze. Anchor intact and DL replaced in same.  Dr. Maren Beach instructed pt to change DL site daily (or every other day if no drainage present on dressing). Cleanse with betadine, apply sterile gel, cover with saline soaked 2 x 2's, and then cover with sterile gauze.   Pt provided with 6 daily dressing kits, 5 anchors, 2 x 2 gauze, sterile gel, and sterile saline for home use. Pt is to return next Tuesday for wound vac change per PVT and full hospital d/c visit with Dr. Shirlee Latch.  PharmD will contact patient with INR results and warfarin dosing instruction.   Hessie Diener RN, VAD Coordinator [phone]  Lower sternal wound examined and wound VAC change.  The ACell product is showing signs of incorporation.  No purulence.  200 mg of ACell powder was applied and covered with Mepitel, lubricant, and a appropriate sized wound VAC sponge  Power cord exit site wound showing evidence of incorporation of ACell product with minimal drainage.  I performed a dressing change  using lubricant over the ACell powder and wet-to-dry dressing changes around the power cord.  patient examined and medical record reviewed,agree with above note. [name] 02/16/2020

## 2020-02-16 NOTE — Progress Notes (Signed)
Sternal wound site pictured below:    Hessie Diener RN, VAD Coordinator [phone]

## 2020-02-16 NOTE — Progress Notes (Signed)
LVAD INR 

## 2020-02-17 ENCOUNTER — Encounter (HOSPITAL_COMMUNITY): Payer: No Typology Code available for payment source

## 2020-02-17 ENCOUNTER — Ambulatory Visit: Payer: No Typology Code available for payment source | Attending: Internal Medicine

## 2020-02-17 DIAGNOSIS — Z23 Encounter for immunization: Secondary | ICD-10-CM

## 2020-02-17 LAB — CULTURE, BLOOD (ROUTINE X 2)
Culture: NO GROWTH
Culture: NO GROWTH
Special Requests: ADEQUATE

## 2020-02-21 ENCOUNTER — Ambulatory Visit (HOSPITAL_COMMUNITY): Payer: Self-pay | Admitting: Pharmacist

## 2020-02-21 ENCOUNTER — Other Ambulatory Visit: Payer: Self-pay

## 2020-02-21 ENCOUNTER — Encounter (HOSPITAL_COMMUNITY): Payer: No Typology Code available for payment source

## 2020-02-21 ENCOUNTER — Other Ambulatory Visit (HOSPITAL_COMMUNITY): Payer: Self-pay | Admitting: *Deleted

## 2020-02-21 ENCOUNTER — Telehealth: Payer: Self-pay

## 2020-02-21 ENCOUNTER — Ambulatory Visit (HOSPITAL_COMMUNITY)
Admission: RE | Admit: 2020-02-21 | Discharge: 2020-02-21 | Disposition: A | Discharge status: Home / Self Care | Payer: No Typology Code available for payment source | Source: Ambulatory Visit | Attending: Cardiology | Admitting: Cardiology

## 2020-02-21 DIAGNOSIS — T827XXA Infection and inflammatory reaction due to other cardiac and vascular devices, implants and grafts, initial encounter: Secondary | ICD-10-CM | POA: Insufficient documentation

## 2020-02-21 DIAGNOSIS — Z4801 Encounter for change or removal of surgical wound dressing: Secondary | ICD-10-CM | POA: Diagnosis not present

## 2020-02-21 DIAGNOSIS — Z95811 Presence of heart assist device: Secondary | ICD-10-CM

## 2020-02-21 DIAGNOSIS — Z7901 Long term (current) use of anticoagulants: Secondary | ICD-10-CM | POA: Insufficient documentation

## 2020-02-21 LAB — BASIC METABOLIC PANEL
Anion gap: 11 (ref 5–15)
BUN: 10 mg/dL (ref 6–20)
CO2: 20 mmol/L — ABNORMAL LOW (ref 22–32)
Calcium: 9.7 mg/dL (ref 8.9–10.3)
Chloride: 104 mmol/L (ref 98–111)
Creatinine, Ser: 0.77 mg/dL (ref 0.61–1.24)
GFR calc Af Amer: >60 mL/min (ref >60)
GFR calc non Af Amer: >60 mL/min (ref >60)
Glucose, Bld: 107 mg/dL — ABNORMAL HIGH (ref 70–99)
Potassium: 4.6 mmol/L (ref 3.5–5.1)
Sodium: 135 mmol/L (ref 135–145)

## 2020-02-21 LAB — PROTIME-INR
INR: 2.3 — ABNORMAL HIGH (ref 0.8–1.2)
Prothrombin Time: 25.3 seconds — ABNORMAL HIGH (ref 11.4–15.2)

## 2020-02-21 LAB — SEDIMENTATION RATE: Sed Rate: 20 mm/hr — ABNORMAL HIGH (ref 0–16)

## 2020-02-21 LAB — C-REACTIVE PROTEIN: CRP: 1.2 mg/dL — ABNORMAL HIGH (ref <1.0)

## 2020-02-21 NOTE — Progress Notes (Signed)
Pt presented to VAD clinic for dressing change. Pt changed to wet/dry over the weekend as pt states that he wound vac kept leaking after several attempts to fix. Attempted to secure all edges of current dressing with no improvement in suction.  Dr. Maren Beach to clinic for full wound vac change.   Dr. Maren Beach performed dressing change using sterile technique. Sternal wound cleaned with betadine swabs, found to have several clots; removed by Dr. Maren Beach.  Cellulite applied in wound, covered by Mepitel dressing, and saline soaked 2 x 2's. Covered with sterile gauze and tape.   DL exit site care per Dr. Maren Beach. Site cleaned with betadine swabs, Cellulite placed in site, packed w/ saline soaked 2 x 2's and covered with sterile gauze and tape. Anchor intact and DL replaced in same.  Dr. Maren Beach instructed pt to change DL site Wednesday and Thursday. Cleanse with betadine, cover with saline soaked 2 x 2's, and then cover with sterile gauze.   Pt provided with 4 x 4 gauze and 2 x 2 gauze. Pt is to return next Friday for dressing change per PVT.  PharmD will contact patient with INR results and warfarin dosing instruction.   [name] RN, VAD Coordinator [phone]

## 2020-02-21 NOTE — Progress Notes (Signed)
LVAD INR 

## 2020-02-21 NOTE — Telephone Encounter (Signed)
Melina Copa with [facility] Called office today to follow up on patient's missed appointment with home health. Per Rn patient and emergency contact did not answer her calls. Was not home when she did a drive by. RN was unable to do labs and dressing change.  Spoke with patient who states he was at [facility] today. Labs were done at appointment. Did not have picc dressing change. Will call home health RN to reschedule appointment.  Spoke with Maralyn Sago, RN who will call patient. Will draw labs next week.  Lorenso Courier, [place]

## 2020-02-22 ENCOUNTER — Encounter (HOSPITAL_COMMUNITY): Payer: No Typology Code available for payment source

## 2020-02-23 ENCOUNTER — Encounter (HOSPITAL_COMMUNITY): Payer: No Typology Code available for payment source

## 2020-02-23 ENCOUNTER — Ambulatory Visit (HOSPITAL_COMMUNITY): Payer: Self-pay | Admitting: Pharmacist

## 2020-02-23 DIAGNOSIS — Z95811 Presence of heart assist device: Secondary | ICD-10-CM

## 2020-02-23 LAB — POCT INR: INR: 2.9 (ref 2.0–3.0)

## 2020-02-23 NOTE — Progress Notes (Signed)
LVAD INR 

## 2020-02-24 ENCOUNTER — Telehealth: Payer: Self-pay

## 2020-02-24 ENCOUNTER — Other Ambulatory Visit (HOSPITAL_COMMUNITY): Payer: Self-pay | Admitting: Cardiology

## 2020-02-24 DIAGNOSIS — T827XXA Infection and inflammatory reaction due to other cardiac and vascular devices, implants and grafts, initial encounter: Secondary | ICD-10-CM

## 2020-02-24 DIAGNOSIS — B0230 Zoster ocular disease, unspecified: Secondary | ICD-10-CM

## 2020-02-24 MED ORDER — PREDNISONE 20 MG PO TABS: 60.0000 mg | ORAL_TABLET | Freq: Every day | ORAL | 0 refills | Status: AC

## 2020-02-24 MED ORDER — VALACYCLOVIR HCL 1 G PO TABS: 1000.0000 mg | ORAL_TABLET | Freq: Three times a day (TID) | ORAL | 0 refills | Status: AC

## 2020-02-24 MED ORDER — OXYCODONE HCL 5 MG PO TABS: 5.0000 mg | ORAL_TABLET | Freq: Four times a day (QID) | ORAL | 0 refills | Status: DC | PRN

## 2020-02-24 MED ORDER — TRAMADOL HCL 50 MG PO TABS: 100.0000 mg | ORAL_TABLET | Freq: Four times a day (QID) | ORAL | 0 refills | Status: DC | PRN

## 2020-02-24 NOTE — Telephone Encounter (Signed)
Called to discuss with Terreon and [name] -   He has no new lesions and some have resolved completely, where he has ongoing crusted rash to left ear and back of head. He is now having tinnitus in the left ear in addition to recurrence of neck pain which makes it hard to turn his head at night.  He also told me that he has some 'dimming effect' to his left eye. He had a lesion over the left eyelid that has resolved. No lesions to nose. No altered smell/taste.   Will place urgent referral for ophthalmology locally to get him seen to assess for occular zoster consideration.   Will resume Valtrex 1 gm PO TID x 10 days  Add prednisone 60 mg QAM with food x 5 days then stop with tinnitus and neck pain.  He is already taking gabapentin 600 mg TID.  May consider adding TCA at upcoming visit if still painful.   Verbalized understanding of the same.     Rexene Alberts, MSN, NP-C [facility]  [email or web address] Pager: [phone] Office: [phone] [facility] Main Line: [phone]

## 2020-02-24 NOTE — Telephone Encounter (Signed)
Patient called office today regarding concerns for Shingles outbreak in back of left ear. Patient states rash is still there even after completing Valacyclovir.  States he is having pain on Lt eat and Lt eye. Would like to know what he should do next.Will forward message Dr. Orvan Falconer and Rexene Alberts, NP Lorenso Courier, CMA

## 2020-02-24 NOTE — Addendum Note (Signed)
Addended by: Blanchard Kelch on: 02/24/2020 02:04 PM   Modules accepted: Orders

## 2020-02-25 ENCOUNTER — Other Ambulatory Visit (HOSPITAL_COMMUNITY): Payer: Self-pay | Admitting: *Deleted

## 2020-02-25 ENCOUNTER — Ambulatory Visit (HOSPITAL_COMMUNITY)
Admission: RE | Admit: 2020-02-25 | Discharge: 2020-02-25 | Disposition: A | Discharge status: Home / Self Care | Payer: No Typology Code available for payment source | Source: Ambulatory Visit | Attending: Cardiology | Admitting: Cardiology

## 2020-02-25 ENCOUNTER — Other Ambulatory Visit: Payer: Self-pay

## 2020-02-25 DIAGNOSIS — Z95811 Presence of heart assist device: Secondary | ICD-10-CM

## 2020-02-25 DIAGNOSIS — Z4800 Encounter for change or removal of nonsurgical wound dressing: Secondary | ICD-10-CM | POA: Diagnosis not present

## 2020-02-25 DIAGNOSIS — Z7901 Long term (current) use of anticoagulants: Secondary | ICD-10-CM

## 2020-02-25 LAB — FUNGUS CULTURE RESULT

## 2020-02-25 LAB — FUNGUS CULTURE WITH STAIN

## 2020-02-25 LAB — FUNGAL ORGANISM REFLEX

## 2020-02-25 NOTE — Progress Notes (Signed)
Pt presented to VAD clinic with wife for dressing change per Dr. Maren Beach.   Dr. Maren Beach performed dressing change using sterile technique. Sternal wound cleaned with betadine swabs.   Acell applied to upper portion of wound, Cellulite applied to lower portion, covered by Mepitel dressing, and saline soaked 2 x 2's. Covered with sterile gauze and tape.   DL exit site care per Dr. Maren Beach. Site cleaned with betadine swabs, Cellulite placed in site, covered by Mepitel dressing and saline soaked 2 x 2's and covered with sterile gauze and tape. Anchor intact and DL replaced in same.    Dr. Maren Beach instructed pt to change DL and sternal dressings Sunday. Cleanse with betadine, cover with saline soaked 2 x 2's, and then cover with sterile gauze.   Pt provided with 4 x 4 gauze, 6 daily dressing kits. Pt is to return next Tuesday for dressing change per PVT.   Hessie Diener RN, VAD Coordinator [phone]

## 2020-02-28 ENCOUNTER — Encounter: Payer: Self-pay | Admitting: Infectious Diseases

## 2020-02-28 ENCOUNTER — Encounter: Payer: Self-pay | Admitting: Internal Medicine

## 2020-02-28 ENCOUNTER — Ambulatory Visit (HOSPITAL_COMMUNITY): Payer: Self-pay | Admitting: Pharmacist

## 2020-02-28 DIAGNOSIS — Z95811 Presence of heart assist device: Secondary | ICD-10-CM

## 2020-02-28 LAB — POCT INR: INR: 4.3 — AB (ref 2.0–3.0)

## 2020-02-28 NOTE — Progress Notes (Signed)
LVAD INR 

## 2020-02-29 ENCOUNTER — Ambulatory Visit (HOSPITAL_COMMUNITY): Payer: Self-pay | Admitting: Pharmacist

## 2020-02-29 ENCOUNTER — Other Ambulatory Visit: Payer: Self-pay

## 2020-02-29 ENCOUNTER — Telehealth: Payer: Self-pay

## 2020-02-29 ENCOUNTER — Ambulatory Visit (HOSPITAL_COMMUNITY)
Admission: RE | Admit: 2020-02-29 | Discharge: 2020-02-29 | Disposition: A | Discharge status: Home / Self Care | Payer: No Typology Code available for payment source | Source: Ambulatory Visit | Attending: Cardiology | Admitting: Cardiology

## 2020-02-29 DIAGNOSIS — Z4801 Encounter for change or removal of surgical wound dressing: Secondary | ICD-10-CM | POA: Diagnosis not present

## 2020-02-29 DIAGNOSIS — Z95811 Presence of heart assist device: Secondary | ICD-10-CM | POA: Diagnosis not present

## 2020-02-29 DIAGNOSIS — Z7901 Long term (current) use of anticoagulants: Secondary | ICD-10-CM

## 2020-02-29 LAB — CBC WITH DIFFERENTIAL/PLATELET
Abs Immature Granulocytes: 0.25 10*3/uL — ABNORMAL HIGH (ref 0.00–0.07)
Basophils Absolute: 0.1 10*3/uL (ref 0.0–0.1)
Basophils Relative: 1 %
Eosinophils Absolute: 0.1 10*3/uL (ref 0.0–0.5)
Eosinophils Relative: 1 %
HCT: 38.1 % — ABNORMAL LOW (ref 39.0–52.0)
Hemoglobin: 12.1 g/dL — ABNORMAL LOW (ref 13.0–17.0)
Immature Granulocytes: 2 %
Lymphocytes Relative: 18 %
Lymphs Abs: 1.9 10*3/uL (ref 0.7–4.0)
MCH: 30 pg (ref 26.0–34.0)
MCHC: 31.8 g/dL (ref 30.0–36.0)
MCV: 94.5 fL (ref 80.0–100.0)
Monocytes Absolute: 1.2 10*3/uL — ABNORMAL HIGH (ref 0.1–1.0)
Monocytes Relative: 12 %
Neutro Abs: 6.8 10*3/uL (ref 1.7–7.7)
Neutrophils Relative %: 66 %
Platelets: 269 10*3/uL (ref 150–400)
RBC: 4.03 MIL/uL — ABNORMAL LOW (ref 4.22–5.81)
RDW: 14.1 % (ref 11.5–15.5)
WBC: 10.3 10*3/uL (ref 4.0–10.5)
nRBC: 0 % (ref 0.0–0.2)

## 2020-02-29 LAB — BASIC METABOLIC PANEL
Anion gap: 12 (ref 5–15)
BUN: 11 mg/dL (ref 8–23)
CO2: 22 mmol/L (ref 22–32)
Calcium: 9.4 mg/dL (ref 8.9–10.3)
Chloride: 104 mmol/L (ref 98–111)
Creatinine, Ser: 0.97 mg/dL (ref 0.61–1.24)
GFR calc Af Amer: >60 mL/min (ref >60)
GFR calc non Af Amer: >60 mL/min (ref >60)
Glucose, Bld: 113 mg/dL — ABNORMAL HIGH (ref 70–99)
Potassium: 3.6 mmol/L (ref 3.5–5.1)
Sodium: 138 mmol/L (ref 135–145)

## 2020-02-29 LAB — PROTIME-INR
INR: 2.6 — ABNORMAL HIGH (ref 0.8–1.2)
Prothrombin Time: 27.9 seconds — ABNORMAL HIGH (ref 11.4–15.2)

## 2020-02-29 NOTE — Progress Notes (Signed)
Pt presented to VAD clinic with wife for dressing change.  Sternal wound:  Existing dressing removed using sterile technique. Cleansed site with betadine swab x 2 and allowed to dry. 250 gm Acell powder applied to upper portion of wound. Cellerate applied to lower portion of wound. Covered with mepitel, sterile gel, and saline soaked 2 x 2s. Covered with sterile gauze and tape.      Exit site: Existing drive line dressing removed using sterile technique. Cleansed with betadine swab x 2 and allowed to dry. Cellerate applied to site. Covered with with mepitel, sterile gel, and saline soaked 2 x 2s. Covered with sterile gauze and tape. Anchor intact.      Dr. Maren Beach instructed pt to change DL and sternal dressings Sunday. Cleanse with betadine, cover with saline soaked 2 x 2's, and then cover with sterile gauze.   Pt has adequate dressing supplies at home. Pt is to return next Tuesday for dressing change per PVT.   Alyce Pagan RN VAD Coordinator  Office: [phone]  24/7 Pager: [phone]

## 2020-02-29 NOTE — Progress Notes (Signed)
LVAD INR 

## 2020-02-29 NOTE — Telephone Encounter (Signed)
COVID-19 Pre-Screening Questions:02/29/20   Do you currently have a fever (>100 F), chills or unexplained body aches? NO  Are you currently experiencing new cough, shortness of breath, sore throat, runny nose? NO  .  Have you recently travelled outside the state of [place] in the last 14 days? NO  .  Have you been in contact with someone that is currently pending confirmation of Covid19 testing or has been confirmed to have the Covid19 virus?  NO  **If the patient answers NO to ALL questions -  advise the patient to please call the clinic before coming to the office should any symptoms develop.     

## 2020-03-01 ENCOUNTER — Ambulatory Visit (INDEPENDENT_AMBULATORY_CARE_PROVIDER_SITE_OTHER): Payer: No Typology Code available for payment source | Admitting: Infectious Diseases

## 2020-03-01 ENCOUNTER — Encounter: Payer: Self-pay | Admitting: Infectious Diseases

## 2020-03-01 DIAGNOSIS — A4901 Methicillin susceptible Staphylococcus aureus infection, unspecified site: Secondary | ICD-10-CM

## 2020-03-01 DIAGNOSIS — T827XXA Infection and inflammatory reaction due to other cardiac and vascular devices, implants and grafts, initial encounter: Secondary | ICD-10-CM

## 2020-03-01 DIAGNOSIS — B027 Disseminated zoster: Secondary | ICD-10-CM | POA: Diagnosis not present

## 2020-03-01 MED ORDER — GABAPENTIN 300 MG PO CAPS: 600.0000 mg | ORAL_CAPSULE | Freq: Three times a day (TID) | ORAL | 0 refills | Status: DC

## 2020-03-01 MED ORDER — AMITRIPTYLINE HCL 10 MG PO TABS: 10.0000 mg | ORAL_TABLET | Freq: Every day | ORAL | 0 refills | Status: DC

## 2020-03-01 NOTE — Progress Notes (Signed)
Patient: Theodore Welch  DOB: 1960-12-08 MRN: [id number] PCP: Reubin Milan, MD    Patient Active Problem List   Diagnosis Date Noted  . Infection associated with driveline of left ventricular assist device (LVAD) ([place]) 11/28/2018    Priority: High  . Complication involving left ventricular assist device (LVAD) 11/23/2018    Priority: High  . LVAD (left ventricular assist device) present ([place]) 08/03/2018    Priority: High  . Disseminated zoster 02/12/2020  . Abscess of sternal region 01/27/2020  . Staph aureus infection 01/27/2020  . Vaccine counseling 08/12/2019  . Chronic systolic (congestive) heart failure ([place]) 11/28/2018  . DVT (deep venous thrombosis) ([place]) 08/02/2018  . Advance care planning   . Ascites   . COPD (chronic obstructive pulmonary disease) ([place]) 07/21/2018  . HTN (hypertension) 07/21/2018  . Tobacco use 07/21/2018  . Congestive heart failure ([place]) 07/21/2018  . CHF (congestive heart failure) ([place]) 07/21/2018  . Type 2 diabetes mellitus ([place]) 07/21/2018  . Hyperlipidemia 07/21/2018  . Fibromyalgia syndrome 01/27/2017  . Myofascial pain syndrome, cervical 01/27/2017  . Lumbar radiculopathy 01/24/2014  . History of lumbar laminectomy for spinal cord decompression 12/07/2013  . 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     Subjective:  CC: Hospital follow up MSSA abscess involving proximal driveline s/p    ID Hx: Theodore Welch is a 59 y.o. 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    January-2020: MSSA infection of counter incision to mid abdomen; s/p serial debridements. Initially did not involve exit site at this time. Received IV Cefazolin x 4 weeks --> PO Cephalexin x 4 weeks.   [id number]: recurrent MSSA infection, now involving driveline exit site and secondary bacteremia. TEE deferred due to low  sensitivity for detecting VAD endocarditis. 6 weeks IV Cefazolin s/p abd wall debridement --> cephalexin for chronic suppression for duration pump is in place.    March-2021 sudden onset subxiphoid abscess involving proximal driveline, MSSA on cultures. Blood cultures negative. S/P I&D of abdominal wall exit site and subxiphoid space. 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    HPI: Theodore Welch is here today alone for hospital follow up recurrent MSSA infections involving his VAD equipment with most recent I&D of subxiphoid space and abdominal wall exit site. He has done well with IV cefazolin TID and has had no trouble with dosing, no side effects noted and no trouble with PICC line. He has followed regularly with VAD team for wound checks and Cellerate applications. He states the wound is looking great at both locations and healing nicely. No further fevers/chills and no pain at either site.   Complicating his refractory MSSA infection he also is suffering significantly from first onset zoster infection. He was re-admitted at the end of March for blistered rash to the back left head, few scattered distal lesions to arms, left eyelid and behind left ear with associated fevers and painful headaches/neck pain. He was given IV acyclovir during hospitalization until he had some improvement and transitioned to oral valtrex 1 gm TID to complete 7 days with guidance from Dr. Megan Salon.   He called ID clinic on 4/08 after about a week following completion of Valtrex to report worsened concerns over pain to left neck with headaches and now dimming to vision involving left eye  and tinnitus. Restarted PO Valtrex 1g TID for 14 days to extend for more complicated picture, started on high dose prednisone x 5d and sent to ophthalmology for evaluation. Records are not available to me today but he was started on drops and advised to  follow up again in 1 month for follow up but no definitive evidence of occular zoster per his report.   He completed the PO Prednisone yesterday and reported it did seem to help a little. He is now on day 6/14 of Valtrex.  He still has severe pain to the back of his left ear around the mastoid bone to the point he cannot wear glasses comfortably. Tinnitus is better. No new blisters [name] his wife has been keeping a close eye on this). The scattered lesions to the right arm have healed. He is in need of a refill of chronic Gabapentin as he cannot see his PCP until September through [place]. He is having a very difficult time sleeping and reports to need Tramadol a few times a day with Tylenol in between; these provide ineffective relief.   He has had no symptoms to suggest facial palsy, no fluid draining from left ear, no blisters to nose, no altered sense of smell, normal gait without dizziness.    Review of Systems  Constitutional: Negative for chills and fever.  HENT: Positive for ear pain and tinnitus. Negative for hearing loss.   Eyes: Negative for blurred vision, photophobia and pain.  Respiratory: Negative for cough and sputum production.   Cardiovascular: Negative for chest pain.  Gastrointestinal: Negative for diarrhea, nausea and vomiting.  Genitourinary: Negative for dysuria.  Musculoskeletal: Positive for neck pain.  Skin: Positive for rash.  Neurological: Positive for tingling and headaches. Negative for dizziness.    Past Medical History:  Diagnosis Date  . 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 Cardiomyopathy, unspecified ([place])   . CHF (congestive heart failure) ([place])   . Chronic back pain   . Diabetes mellitus without complication ([place])   . Enlarged heart   . Gastric ulcer   . Gastroenteritis   . H/O degenerative disc disease   . Hypertension   . LVAD (left ventricular assist device) present [facility])     Outpatient Medications Prior to Visit  Medication Sig Dispense  Refill  . ceFAZolin (ANCEF) IVPB Inject 2 g into the vein every 8 (eight) hours. Indication:  MSSA LVAD infection Last Day of Therapy:  03/27/2020 Labs - Once weekly:  CBC/D and BMP, Labs - Every other week:  ESR and CRP 165 Units 0  . amLODipine (NORVASC) 5 MG tablet Take 2 tablets (10 mg total) by mouth daily. 180 tablet 3  . aspirin EC 81 MG tablet Take 81 mg by mouth daily.    . carbamide peroxide (DEBROX) 6.5 % OTIC solution Place 5 drops into both ears 3 (three) times daily. (Patient taking differently: Place 5 drops into both ears 2 (two) times daily. ) 15 mL 0  . cyanocobalamin 500 MCG tablet Take 1,000 mcg by mouth daily.     Marland Kitchen docusate sodium (COLACE) 100 MG capsule Take 2 capsules (200 mg total) by mouth daily as needed for mild constipation. 10 capsule 0  . fluticasone (FLONASE) 50 MCG/ACT nasal spray Place 1 spray into both nostrils 2 (two) times daily as needed for allergies.     Marland Kitchen glucose blood (CONTOUR NEXT TEST) test strip Use as instructed 100 each 12  . hydrALAZINE (APRESOLINE) 50 MG tablet Take 1  tablet (50 mg total) by mouth 3 (three) times daily. 90 tablet 5  . Ipratropium-Albuterol (COMBIVENT) 20-100 MCG/ACT AERS respimat Inhale 1 puff into the lungs every 6 (six) hours as needed for wheezing or shortness of breath.     . Lancets (FREESTYLE) lancets Use as instructed 100 each 12  . nystatin (MYCOSTATIN/NYSTOP) powder Apply 1 application topically 3 (three) times daily. 15 g 3  . oxyCODONE (OXY IR/ROXICODONE) 5 MG immediate release tablet Take 1 tablet (5 mg total) by mouth every 6 (six) hours as needed for severe pain. 20 tablet 0  . pantoprazole (PROTONIX) 40 MG tablet Take 1 tablet (40 mg total) by mouth daily. 30 tablet 2  . sacubitril-valsartan (ENTRESTO) 97-103 MG Take 1 tablet by mouth 2 (two) times daily. 60 tablet 6  . sildenafil (VIAGRA) 100 MG tablet Take 50 mg by mouth daily as needed for erectile dysfunction.    Marland Kitchen spironolactone (ALDACTONE) 25 MG tablet Take 1  tablet (25 mg total) by mouth daily. 30 tablet 5  . thiamine 100 MG tablet Take 1 tablet (100 mg total) by mouth daily. 30 tablet 0  . traMADol (ULTRAM) 50 MG tablet Take 2 tablets (100 mg total) by mouth every 6 (six) hours as needed for severe pain. 20 tablet 0  . valACYclovir (VALTREX) 1000 MG tablet Take 1 tablet (1,000 mg total) by mouth 3 (three) times daily for 10 days. 30 tablet 0  . varenicline (CHANTIX CONTINUING MONTH PAK) 1 MG tablet Take 1 mg by mouth daily.     Marland Kitchen warfarin (COUMADIN) 5 MG tablet Take 7.5 mg (1.5 tab) MWF and 5 mg all other days or as directed by HF Clinic. 100 tablet 3  . Zinc Gluconate 100 MG TABS Take 1 tablet (100 mg total) by mouth daily. (Patient taking differently: Take 100 mg by mouth 2 (two) times a day. ) 90 tablet 3  . zolpidem (AMBIEN) 5 MG tablet TAKE 1 TABLET BY MOUTH EVERY DAY AT BEDTIME 30 tablet 2  . gabapentin (NEURONTIN) 300 MG capsule Take 600 mg by mouth 3 (three) times daily.      No facility-administered medications prior to visit.     Allergies  Allergen Reactions  . Chlorhexidine Rash  . Other Rash    Prep pads    Social History   Tobacco Use  . Smoking status: Current Every Day Smoker    Types: Cigarettes    Start date: 2003  . Smokeless tobacco: Never Used  . Tobacco comment: Nicoderm patch   Substance Use Topics  . Alcohol use: Not Currently  . Drug use: Not Currently    No family history on file.  Objective:   Vitals:   03/01/20 1429  Weight: 195 lb (88.5 kg)   Body mass index is 27.98 kg/m.  Physical Exam Vitals reviewed.  Constitutional:      Appearance: He is well-developed.     Comments: He appears uncomfortable today.   HENT:     Head:      Comments: Multiple clustered crusted lesions noted overlying marked area along C2 - C3 dermatome. Covered with Calamine lotion residue.     Right Ear: Hearing, tympanic membrane, ear canal and external ear normal.     Left Ear: Hearing, tympanic membrane and ear  canal normal. No drainage, swelling or tenderness (no tenderness to pinna or during canal exam). There is mastoid tenderness.     Ears:     Comments: No blisters noted with careful inspection  of internal canal     Nose: Nose normal.     Mouth/Throat:     Dentition: Normal dentition. No dental abscesses.  Cardiovascular:     Rate and Rhythm: Normal rate and regular rhythm.     Comments: LVAD humm, no native heart tones heard.  Pulmonary:     Effort: Pulmonary effort is normal.     Breath sounds: Normal breath sounds.  Abdominal:     Palpations: Abdomen is soft.     Tenderness: There is no abdominal tenderness.     Comments: Abdominal dressings clean and dry. Sites not directly observed, chart photos reviewed of sternal and external driveline site. There appears to be healthy tissue ingrowth without any purulent drainage. Wound margins are clean and without erythema or swelling. There is yellow material in the wound bed scattered throughout granulation tissue c/w accelerator applications.   Lymphadenopathy:     Cervical: No cervical adenopathy.  Skin:    General: Skin is warm and dry.     Findings: No rash.  Neurological:     Mental Status: He is alert and oriented to person, place, and time.  Psychiatric:        Judgment: Judgment normal.     Lab Results: Lab Results  Component Value Date   WBC 10.3 02/29/2020   HGB 12.1 (L) 02/29/2020   HCT 38.1 (L) 02/29/2020   MCV 94.5 02/29/2020   PLT 269 02/29/2020    Lab Results  Component Value Date   CREATININE 0.97 02/29/2020   BUN 11 02/29/2020   NA 138 02/29/2020   K 3.6 02/29/2020   CL 104 02/29/2020   CO2 22 02/29/2020    Sed Rate (mm/hr)  Date Value  02/21/2020 20 (H)  05/05/2019 4   CRP (mg/dL)  Date Value  02/21/2020 1.2 (H)  05/05/2019 <0.8     Assessment & Plan:   Problem List Items Addressed This Visit      High   Infection associated with driveline of left ventricular assist device (LVAD) ([place])     Unfortunately Tiandre had refractory subxiphoid abscess formation despite chronic suppressive therapy with Cephalexin. He is now well debrided and the site is healing nicely. There was no definitive findings on CT indicating sternal osteomyelitis and bone overall remarked upon by Dr. Prescott Gum to be healthy appearing and no necrosis/softening noted. Will continue with 8 weeks of IV therapy and transition back to Cephalexin 500 TID at least until wounds are closed. May consider BID thereafter for presumed pump infection given there was no other findings on CT or exam concerning for other sites along driveline to be involved.         Unprioritized   Staph aureus infection    Nasal PCR for MSSA and MRSA are both recently negative. No indication to decolonize nares with mupiricin.       Disseminated zoster    Primary outbreak for [facility] with involvement along the C2 and probably C3 dermatome with scattered isolated lesions to arms. He is on extended course of therapy with Valtrex to complete 14 days (now on day 6) for more complicated presentation.  Initially I had concern over [place] however he has no facial paralysis (although does note some tics at times to the left neck/head) and there are no vesicles or evidence of previous vesicles involving the left ear canal and reassuring there is no otic involvement.   He has had improvement with 5 day course of prednisone specifically with  tinnitus and to a degree neck pain.  He has been seen by Dr. Katy Fitch with Ophthalmology and sounds like there was no definitive optic involvement; was started on some drops (?steroid) with follow up in a few weeks - records requested for review.    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 Will refill x 1 at current dose for him to bridge to next appt with PCP. May consider increasing this for effect but I worry it won't help given his long-standing  exposure to it and concern over side effects with higher doses. Will trial 10 mg QHS Amitriptyline with dose titration after 1 week if he finds it helpful and no significant side effects. May need referral to Neurology for assistance with neuropathic pain.   Would advise Shingrix vaccine in the near future - CDC recommendations without a specific timeline but will defer during active treatment and likely minimum 2 weeks following completion of antiviral to avoid impact on efficacy.       ADDENDUM: Amitriptyline can increase warfarin AUC and put him at higher bleeding risk. Will discuss with [place] trial of Alpha-Lipoic Acid 600 mg once daily with increase to 1200 mg after 5 days to see if this offers help. Low threshold to add back Prednisone if tinnitus returns. Blood sugars noted on labs to be 230s and with open wounds would like to defer and use shortest course possible.          RTC early May to transition off IV to PO antibiotic and follow up again on shingles.   Janene Madeira, MSN, NP-C [facility] for Infectious Disease [email or web address] Pager: [phone] Office: [phone] [place]: [phone]

## 2020-03-01 NOTE — Patient Instructions (Addendum)
Please continue the Valtrex 1 blue pill three times a day for another 8 days.   Will start a new medication every evening called Amitriptyline 10 mg tablet, one every night before bed. Studies have shown that this may help more with the nerve pain associated with shingles.   I am going to call you next week to check in and see if we need to increase this dose.  Please try to take your tramadol only 2 times a day while adding this medication - combined together can increase likelihood of side effects.   Will continue your IV cefazolin three times a day until May 10th. Will see you back shortly before this date and plan to change you back to Keflex at that point.

## 2020-03-01 NOTE — Assessment & Plan Note (Signed)
Unfortunately Theodore Welch had refractory subxiphoid abscess formation despite chronic suppressive therapy with Cephalexin. He is now well debrided and the site is healing nicely. There was no definitive findings on CT indicating sternal osteomyelitis and bone overall remarked upon by Dr. Donata Clay to be healthy appearing and no necrosis/softening noted. Will continue with 8 weeks of IV therapy and transition back to Cephalexin 500 TID at least until wounds are closed. May consider BID thereafter for presumed pump infection given there was no other findings on CT or exam concerning for other sites along driveline to be involved.

## 2020-03-01 NOTE — Assessment & Plan Note (Signed)
Nasal PCR for MSSA and MRSA are both recently negative. No indication to decolonize nares with mupiricin.

## 2020-03-01 NOTE — Assessment & Plan Note (Addendum)
Primary outbreak for [facility] with involvement along the C2 and probably C3 dermatome with scattered isolated lesions to arms. He is on extended course of therapy with Valtrex to complete 14 days (now on day 6) for more complicated presentation.  Initially I had concern over [place] Syndrome however he has no facial paralysis (although does note some tics at times to the left neck/head) and there are no vesicles or evidence of previous vesicles involving the left ear canal and reassuring there is no otic involvement.  There is probably no benefit to treat beyond this extended duration as persistent viral replication is unlikely to be ongoing factor.   He has been seen by Dr. Dione Booze with Ophthalmology and sounds like there was no definitive optic involvement; was started on some drops (?steroid) with follow up in a few weeks - records requested for review.    He is experiencing significantly painful post-herpetic neuralgia. He has had improvement with 5 day course of prednisone specifically with tinnitus and to a degree neck pain.  Already on gabapentin 1800 mg in divided doses. Will refill x 1 at current dose for him to bridge to next appt with PCP. May consider increasing this for effect but I worry it won't help given his long-standing exposure to it and concern over side effects with higher doses. Will trial 10 mg QHS Amitriptyline with dose titration after 1 week if he finds it helpful and no significant side effects. May need referral to Neurology for assistance with neuropathic pain.   Would advise Shingrix vaccine in the near future - CDC recommendations without a specific timeline but will defer during active treatment and likely minimum 2 weeks following completion of antiviral to avoid impact on efficacy.       ADDENDUM: Amitriptyline can increase warfarin AUC and put him at higher bleeding risk. Will discuss with [place] trial of Alpha-Lipoic Acid 600 mg once daily with increase to 1200 mg after  5 days to see if this offers help. Low threshold to add back Prednisone if tinnitus returns. Blood sugars noted on labs to be 230s and with open wounds would like to defer and use shortest course possible. Could switch from gabapetin to Lyrica alternatively to see if he has better response to that.

## 2020-03-03 ENCOUNTER — Other Ambulatory Visit (HOSPITAL_COMMUNITY): Payer: Self-pay | Admitting: *Deleted

## 2020-03-03 DIAGNOSIS — Z7901 Long term (current) use of anticoagulants: Secondary | ICD-10-CM

## 2020-03-03 DIAGNOSIS — Z95811 Presence of heart assist device: Secondary | ICD-10-CM

## 2020-03-06 ENCOUNTER — Encounter: Payer: Self-pay | Admitting: Infectious Diseases

## 2020-03-07 ENCOUNTER — Other Ambulatory Visit: Payer: Self-pay

## 2020-03-07 ENCOUNTER — Ambulatory Visit (HOSPITAL_COMMUNITY)
Admission: RE | Admit: 2020-03-07 | Discharge: 2020-03-07 | Disposition: A | Discharge status: Home / Self Care | Payer: No Typology Code available for payment source | Source: Ambulatory Visit | Attending: Internal Medicine | Admitting: Internal Medicine

## 2020-03-07 ENCOUNTER — Ambulatory Visit (HOSPITAL_COMMUNITY): Payer: Self-pay | Admitting: Pharmacist

## 2020-03-07 DIAGNOSIS — Z95811 Presence of heart assist device: Secondary | ICD-10-CM | POA: Insufficient documentation

## 2020-03-07 DIAGNOSIS — Z7901 Long term (current) use of anticoagulants: Secondary | ICD-10-CM | POA: Diagnosis not present

## 2020-03-07 LAB — CBC WITH DIFFERENTIAL/PLATELET
Abs Immature Granulocytes: 0.1 10*3/uL — ABNORMAL HIGH (ref 0.00–0.07)
Basophils Absolute: 0.1 10*3/uL (ref 0.0–0.1)
Basophils Relative: 1 %
Eosinophils Absolute: 0.5 10*3/uL (ref 0.0–0.5)
Eosinophils Relative: 6 %
HCT: 34.9 % — ABNORMAL LOW (ref 39.0–52.0)
Hemoglobin: 11.1 g/dL — ABNORMAL LOW (ref 13.0–17.0)
Immature Granulocytes: 1 %
Lymphocytes Relative: 17 %
Lymphs Abs: 1.5 10*3/uL (ref 0.7–4.0)
MCH: 29.8 pg (ref 26.0–34.0)
MCHC: 31.8 g/dL (ref 30.0–36.0)
MCV: 93.6 fL (ref 80.0–100.0)
Monocytes Absolute: 1 10*3/uL (ref 0.1–1.0)
Monocytes Relative: 11 %
Neutro Abs: 5.8 10*3/uL (ref 1.7–7.7)
Neutrophils Relative %: 64 %
Platelets: 243 10*3/uL (ref 150–400)
RBC: 3.73 MIL/uL — ABNORMAL LOW (ref 4.22–5.81)
RDW: 13.3 % (ref 11.5–15.5)
WBC: 9 10*3/uL (ref 4.0–10.5)
nRBC: 0 % (ref 0.0–0.2)

## 2020-03-07 LAB — PROTIME-INR
INR: 2.4 — ABNORMAL HIGH (ref 0.8–1.2)
Prothrombin Time: 25.8 seconds — ABNORMAL HIGH (ref 11.4–15.2)

## 2020-03-07 LAB — BASIC METABOLIC PANEL
Anion gap: 10 (ref 5–15)
BUN: 10 mg/dL (ref 8–23)
CO2: 21 mmol/L — ABNORMAL LOW (ref 22–32)
Calcium: 9.4 mg/dL (ref 8.9–10.3)
Chloride: 108 mmol/L (ref 98–111)
Creatinine, Ser: 0.86 mg/dL (ref 0.61–1.24)
GFR calc Af Amer: >60 mL/min (ref >60)
GFR calc non Af Amer: >60 mL/min (ref >60)
Glucose, Bld: 134 mg/dL — ABNORMAL HIGH (ref 70–99)
Potassium: 4.1 mmol/L (ref 3.5–5.1)
Sodium: 139 mmol/L (ref 135–145)

## 2020-03-07 NOTE — Progress Notes (Signed)
LVAD INR 

## 2020-03-07 NOTE — Addendum Note (Signed)
Encounter addended by: Lebron Quam, RN on: 03/07/2020 1:32 PM  Actions taken: Clinical Note Signed

## 2020-03-07 NOTE — Progress Notes (Signed)
Pt presented to VAD clinic with wife for dressing change.  Sternal wound:  Existing dressing removed using sterile technique. Cleansed site with betadine swab x 2 and allowed to dry. Cellerate applied to wound bed. Covered with mepitel, and saline soaked 2 x 2s. Covered with sterile gauze and tape.      Exit site: Existing drive line dressing removed using sterile technique. Cleansed with betadine swab x 2 and allowed to dry. Cellerate applied to site. Covered with with mepitel, and saline soaked 2 x 2s. Covered with sterile gauze and tape. Anchor intact.       Dr. Maren Beach instructed pts caregiver to change DL and sternal dressings daily. Cleanse with betadine, cover with saline soaked 2 x 2's, and then cover with sterile gauze.   Pt given 4 x 4, 2 x 2 and a box of betadine. Pt is to return next Tuesday for dressing change per PVT.   [name] RN VAD Coordinator  Office: [phone]  24/7 Pager: [phone]

## 2020-03-08 ENCOUNTER — Telehealth: Payer: Self-pay | Admitting: Infectious Diseases

## 2020-03-08 NOTE — Telephone Encounter (Signed)
Theodore Welch reports an improvement in the tingling pain since last week with increasing gabapentin and adding ALA supplement. He is not sure what dose [name] is giving him but thinks it is one pill. I told him he can increase to 1200 mg once a day and if needed we can increase the gabapentin to where he takes 900 mg BID with 600 mg in the afternoon to slowly increase and avoid sedation for him.   He will continue current gaba dose for now 600/600/900 and continue on ALA 1200 mg QD.

## 2020-03-09 LAB — ACID FAST CULTURE WITH REFLEXED SENSITIVITIES (MYCOBACTERIA): Acid Fast Culture: NEGATIVE

## 2020-03-10 ENCOUNTER — Other Ambulatory Visit (HOSPITAL_COMMUNITY): Payer: Self-pay | Admitting: Unknown Physician Specialty

## 2020-03-10 DIAGNOSIS — Z95811 Presence of heart assist device: Secondary | ICD-10-CM

## 2020-03-10 DIAGNOSIS — Z7901 Long term (current) use of anticoagulants: Secondary | ICD-10-CM

## 2020-03-13 ENCOUNTER — Ambulatory Visit (HOSPITAL_COMMUNITY): Payer: Self-pay | Admitting: Pharmacist

## 2020-03-13 ENCOUNTER — Other Ambulatory Visit (HOSPITAL_COMMUNITY): Payer: No Typology Code available for payment source

## 2020-03-13 ENCOUNTER — Other Ambulatory Visit: Payer: Self-pay

## 2020-03-13 ENCOUNTER — Ambulatory Visit: Payer: No Typology Code available for payment source

## 2020-03-13 ENCOUNTER — Ambulatory Visit
Admission: RE | Admit: 2020-03-13 | Discharge: 2020-03-13 | Disposition: A | Discharge status: Home / Self Care | Payer: No Typology Code available for payment source | Source: Ambulatory Visit | Attending: Cardiology | Admitting: Cardiology

## 2020-03-13 DIAGNOSIS — Z23 Encounter for immunization: Secondary | ICD-10-CM | POA: Diagnosis not present

## 2020-03-13 DIAGNOSIS — Z95811 Presence of heart assist device: Secondary | ICD-10-CM

## 2020-03-13 DIAGNOSIS — Z7901 Long term (current) use of anticoagulants: Secondary | ICD-10-CM | POA: Insufficient documentation

## 2020-03-13 DIAGNOSIS — Z4801 Encounter for change or removal of surgical wound dressing: Secondary | ICD-10-CM | POA: Diagnosis not present

## 2020-03-13 LAB — PROTIME-INR
INR: 3.1 — ABNORMAL HIGH (ref 0.8–1.2)
Prothrombin Time: 32 seconds — ABNORMAL HIGH (ref 11.4–15.2)

## 2020-03-13 NOTE — Progress Notes (Signed)
   Covid-19 Vaccination Clinic  Name:  Theodore Welch    MRN: [id number] DOB: 07-29-1961  03/13/2020  Mr. [name] was observed post Covid-19 immunization for 15 minutes without incident. He was provided with Vaccine Information Sheet and instruction to access the V-Safe system.   Mr. [name] was instructed to call 911 with any severe reactions post vaccine: Marland Kitchen Difficulty breathing  . Swelling of face and throat  . A fast heartbeat  . A bad rash all over body  . Dizziness and weakness   Immunizations Administered    Name Date Dose VIS Date Route   Pfizer COVID-19 Vaccine 03/13/2020  9:29 AM 0.3 mL 01/12/2019 Intramuscular   Manufacturer: [employer], [employer]   Lot: [id number]   NDC: [address]-7

## 2020-03-13 NOTE — Progress Notes (Signed)
LVAD INR 

## 2020-03-13 NOTE — Progress Notes (Signed)
Pt presented to VAD clinic alone for dressing change. Pt states that he got his second COVID shot this morning.  Sternal wound:  Existing dressing removed using sterile technique. Cleansed site with betadine swab x 2 and allowed to dry. Cellerate applied to wound bed. Covered with mepitel, and saline soaked 2 x 2s. Covered with sterile gauze and tape.    Exit site: Existing drive line dressing removed using sterile technique. Cleansed with betadine swab x 2 and allowed to dry. Cellerate applied to site. Covered with with mepitel, and saline soaked 2 x 2s. Covered with sterile gauze and tape. Anchor intact.       Dr. Maren Beach instructed pts caregiver to change DL and sternal dressings daily. Cleanse with betadine, cover with saline soaked 2 x 2's, and then cover with sterile gauze. Change the driveline dressing every 2 days.  Pt is to return next Monday for dressing change per PVT.   [name] RN VAD Coordinator  Office: [phone]  24/7 Pager: [phone]

## 2020-03-13 NOTE — Addendum Note (Signed)
Encounter addended by: Lebron Quam, RN on: 03/13/2020 1:15 PM  Actions taken: Clinical Note Signed

## 2020-03-15 ENCOUNTER — Telehealth: Payer: Self-pay

## 2020-03-15 NOTE — Telephone Encounter (Signed)
Patient called requesting to speak with Judeth Cornfield to discuss antibiotic regimen after PICC line is removed. Let him know provider isn't in the office but that RN would pass his message along.   Secret Kristensen Loyola Mast, RN

## 2020-03-16 ENCOUNTER — Ambulatory Visit (HOSPITAL_COMMUNITY): Payer: Self-pay | Admitting: Pharmacist

## 2020-03-16 DIAGNOSIS — Z95811 Presence of heart assist device: Secondary | ICD-10-CM

## 2020-03-16 LAB — POCT INR: INR: 3.2 — AB (ref 2.0–3.0)

## 2020-03-17 ENCOUNTER — Other Ambulatory Visit (HOSPITAL_COMMUNITY): Payer: Self-pay | Admitting: Unknown Physician Specialty

## 2020-03-17 DIAGNOSIS — Z7901 Long term (current) use of anticoagulants: Secondary | ICD-10-CM

## 2020-03-17 DIAGNOSIS — Z95811 Presence of heart assist device: Secondary | ICD-10-CM

## 2020-03-17 NOTE — Telephone Encounter (Signed)
Returned patient's call but had to leave a VM.

## 2020-03-20 ENCOUNTER — Other Ambulatory Visit: Payer: Self-pay

## 2020-03-20 ENCOUNTER — Ambulatory Visit (HOSPITAL_COMMUNITY): Payer: Self-pay | Admitting: Pharmacist

## 2020-03-20 ENCOUNTER — Ambulatory Visit (HOSPITAL_COMMUNITY)
Admission: RE | Admit: 2020-03-20 | Discharge: 2020-03-20 | Disposition: A | Discharge status: Home / Self Care | Payer: Medicare HMO | Source: Ambulatory Visit | Attending: Internal Medicine | Admitting: Internal Medicine

## 2020-03-20 DIAGNOSIS — Z95811 Presence of heart assist device: Secondary | ICD-10-CM

## 2020-03-20 DIAGNOSIS — Z7901 Long term (current) use of anticoagulants: Secondary | ICD-10-CM | POA: Diagnosis not present

## 2020-03-20 LAB — PROTIME-INR
INR: 2.6 — ABNORMAL HIGH (ref 0.8–1.2)
Prothrombin Time: 27.1 seconds — ABNORMAL HIGH (ref 11.4–15.2)

## 2020-03-20 NOTE — Progress Notes (Signed)
Pt presented to VAD clinic alone for dressing change. Pt states that he is feeling much better today.   Sternal wound:  Existing dressing removed using sterile technique. Scant yellow drainage. Cleansed site with betadine swab x 2 and allowed to dry. Cellerate applied to wound bed. Covered with mepitel, and saline soaked 2 x 2s. Covered with sterile gauze and tape.     Exit site: Existing drive line dressing removed using sterile technique. Scant yellow drainage. Cleansed with betadine swab x 2 and allowed to dry. Silver nitrate to proud flesh. Cellerate applied to site. Covered with with mepitel, and saline soaked 2 x 2s. Covered with sterile gauze and tape. Anchor intact.       Dr. Maren Beach instructed pts caregiver to change DL and sternal dressings daily. Cleanse with betadine, cover with saline soaked 2 x 2's, and then cover with sterile gauze. Change the driveline dressing every 2 days.  Pt is to return next Tuesday for a full visit   [name] RN VAD Coordinator  Office: [phone]  24/7 Pager: [phone]

## 2020-03-20 NOTE — Addendum Note (Signed)
Encounter addended by: Lebron Quam, RN on: 03/20/2020 10:53 AM  Actions taken: Clinical Note Signed

## 2020-03-20 NOTE — Progress Notes (Signed)
LVAD INR 

## 2020-03-23 ENCOUNTER — Encounter: Payer: Self-pay | Admitting: Infectious Diseases

## 2020-03-23 ENCOUNTER — Other Ambulatory Visit: Payer: Self-pay

## 2020-03-23 ENCOUNTER — Ambulatory Visit (INDEPENDENT_AMBULATORY_CARE_PROVIDER_SITE_OTHER): Payer: Medicare HMO | Admitting: Infectious Diseases

## 2020-03-23 DIAGNOSIS — B027 Disseminated zoster: Secondary | ICD-10-CM

## 2020-03-23 DIAGNOSIS — L02213 Cutaneous abscess of chest wall: Secondary | ICD-10-CM | POA: Diagnosis not present

## 2020-03-23 DIAGNOSIS — T827XXA Infection and inflammatory reaction due to other cardiac and vascular devices, implants and grafts, initial encounter: Secondary | ICD-10-CM | POA: Diagnosis not present

## 2020-03-23 MED ORDER — CEPHALEXIN 500 MG PO CAPS: 500.0000 mg | ORAL_CAPSULE | Freq: Three times a day (TID) | ORAL | 11 refills | Status: DC

## 2020-03-23 NOTE — Assessment & Plan Note (Signed)
S/P serial debridements and now nearly resolved. As above.

## 2020-03-23 NOTE — Patient Instructions (Signed)
So nice to see you are doing better!  OK for the shingles shot 3 weeks after your last COVID shot  Will remove your PICC line today   I want you to get in 3 doses of the keflex antibiotic pill today then tomorrow start taking it 4 times a day until your chest and driveline heal up completely. THEN we will drop it back to 3 times a day again.   You can continue the ALA supplement if you like or start tapering off of it since your pain from shingles is gone    Please come back in 2 months so we can see how your wounds are doing.

## 2020-03-23 NOTE — Assessment & Plan Note (Addendum)
His wounds are healing nicely. Will go ahead and transition back to oral cephalexin 500 mg QID until they are completely epithelialized then reduce back to TID for chronic suppression for the duration of the life of pump. We did discuss alternative BID option with Doxycycline however he would like to avoid GI side effects that usually come with that and stick with cephalexin.  D/C picc line today.

## 2020-03-23 NOTE — Assessment & Plan Note (Addendum)
He has recovered from acute zoster and resolved his neuropathy/tinnitus with adding ALA supplement.  I advised he could continue this if he chooses but would be OK to wean off now. No further valtrex needed. Shingrix vaccine to follow 29m after his last COVID shot.

## 2020-03-23 NOTE — Progress Notes (Signed)
Per verbal order from Rexene Alberts, FNP, 38 cm Single Lumen Peripherally Inserted Central Catheter removed from right basilic, tip intact. No sutures present. RN confirmed length per chart. Dressing was clean and dry. Petroleum dressing applied. Pt advised no heavy lifting with this arm, leave dressing for 24 hours and call the office or seek emergent care if dressing becomes soaked with blood or sharp pain presents. Patient verbalized understanding and agreement.  Patient's questions answered to their satisfaction. Patient tolerated procedure well, RN walked patient to check out. Notified home health and [facility] pharmacy. Andree Coss, RN

## 2020-03-23 NOTE — Progress Notes (Signed)
Patient: Theodore Welch  DOB: December 24, 1960 MRN: [id number] PCP: Reubin Milan, MD    Patient Active Problem List   Diagnosis Date Noted  . Infection associated with driveline of left ventricular assist device (LVAD) ([place]) 11/28/2018    Priority: High  . Complication involving left ventricular assist device (LVAD) 11/23/2018    Priority: High  . LVAD (left ventricular assist device) present ([place]) 08/03/2018    Priority: High  . Disseminated zoster 02/12/2020  . Abscess of sternal region 01/27/2020  . Staph aureus infection 01/27/2020  . Vaccine counseling 08/12/2019  . Chronic systolic (congestive) heart failure ([place]) 11/28/2018  . DVT (deep venous thrombosis) ([place]) 08/02/2018  . Advance care planning   . Ascites   . COPD (chronic obstructive pulmonary disease) ([place]) 07/21/2018  . HTN (hypertension) 07/21/2018  . Tobacco use 07/21/2018  . Congestive heart failure ([place]) 07/21/2018  . CHF (congestive heart failure) ([place]) 07/21/2018  . Type 2 diabetes mellitus ([place]) 07/21/2018  . Hyperlipidemia 07/21/2018  . Fibromyalgia syndrome 01/27/2017  . Myofascial pain syndrome, cervical 01/27/2017  . Lumbar radiculopathy 01/24/2014  . History of lumbar laminectomy for spinal cord decompression 12/07/2013  . 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     Subjective:  CC: Hospital follow up MSSA abscess involving proximal driveline s/p    ID Hx: Theodore Welch is a 59 y.o. 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    January-2020: MSSA infection of counter incision to mid abdomen; s/p serial debridements. Initially did not involve exit site at this time. Received IV Cefazolin x 4 weeks --> PO Cephalexin x 4 weeks.   YQM-2500: recurrent MSSA infection, now involving driveline exit site and secondary bacteremia. TEE deferred due to low  sensitivity for detecting VAD endocarditis. 6 weeks IV Cefazolin s/p abd wall debridement --> cephalexin for chronic suppression for duration pump is in place.    March-2021 sudden onset subxiphoid abscess involving proximal driveline, MSSA on cultures. Blood cultures negative. S/P I&D of abdominal wall exit site and subxiphoid space. 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    HPI: [name] and his wife, Theodore Welch are here today for follow up. He has improved significantly with regards to shingles outbreak. He received his last COVID vaccine 4/26 and plans to get his Shingrix series in 3 weeks. He is hopeful he can avoid having a recurrent outbreak in the future. All headaches, neck pain and ear pain/tinnitus are gone.   His sternal wound is nearly healed over entirely - only a sliver left; and his driveline is also nearly healed over. Very little drainage to both. He has been following with Dr. Prescott Gum and LVAD team closely for these wounds.  He is anxious to get his PICC line out.    Review of Systems  Constitutional: Negative for chills and fever.  HENT: Negative for tinnitus.   Eyes: Negative for blurred vision and photophobia.  Respiratory: Negative for cough and sputum production.   Cardiovascular: Negative for chest pain.  Gastrointestinal: Negative for diarrhea, nausea and vomiting.  Genitourinary: Negative for dysuria.  Skin: Negative for rash.  Neurological: Negative for headaches.  PICC line is without pain, drainage or erythema and is well maintained by [facility] Team. No swelling or altered sensation in affected distal extremity.  Past Medical History:  Diagnosis Date  . 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 Cardiomyopathy, unspecified ([place])   . CHF (congestive heart failure) ([place])   . Chronic back pain   . Diabetes mellitus without complication ([place])   . Enlarged heart     . Gastric ulcer   . Gastroenteritis   . H/O degenerative disc disease   . Hypertension   . LVAD (left ventricular assist device) present [facility])     Outpatient Medications Prior to Visit  Medication Sig Dispense Refill  . aspirin EC 81 MG tablet Take 81 mg by mouth daily.    . carbamide peroxide (DEBROX) 6.5 % OTIC solution Place 5 drops into both ears 3 (three) times daily. (Patient taking differently: Place 5 drops into both ears 2 (two) times daily. ) 15 mL 0  . cyanocobalamin 500 MCG tablet Take 1,000 mcg by mouth daily.     Marland Kitchen docusate sodium (COLACE) 100 MG capsule Take 2 capsules (200 mg total) by mouth daily as needed for mild constipation. 10 capsule 0  . fluticasone (FLONASE) 50 MCG/ACT nasal spray Place 1 spray into both nostrils 2 (two) times daily as needed for allergies.     Marland Kitchen gabapentin (NEURONTIN) 300 MG capsule Take 2 capsules (600 mg total) by mouth 3 (three) times daily. 180 capsule 0  . glucose blood (CONTOUR NEXT TEST) test strip Use as instructed 100 each 12  . hydrALAZINE (APRESOLINE) 50 MG tablet Take 1 tablet (50 mg total) by mouth 3 (three) times daily. 90 tablet 5  . Ipratropium-Albuterol (COMBIVENT) 20-100 MCG/ACT AERS respimat Inhale 1 puff into the lungs every 6 (six) hours as needed for wheezing or shortness of breath.     . Lancets (FREESTYLE) lancets Use as instructed 100 each 12  . nystatin (MYCOSTATIN/NYSTOP) powder Apply 1 application topically 3 (three) times daily. 15 g 3  . oxyCODONE (OXY IR/ROXICODONE) 5 MG immediate release tablet Take 1 tablet (5 mg total) by mouth every 6 (six) hours as needed for severe pain. 20 tablet 0  . pantoprazole (PROTONIX) 40 MG tablet Take 1 tablet (40 mg total) by mouth daily. 30 tablet 2  . sacubitril-valsartan (ENTRESTO) 97-103 MG Take 1 tablet by mouth 2 (two) times daily. 60 tablet 6  . sildenafil (VIAGRA) 100 MG tablet Take 50 mg by mouth daily as needed for erectile dysfunction.    Marland Kitchen spironolactone (ALDACTONE) 25 MG  tablet Take 1 tablet (25 mg total) by mouth daily. 30 tablet 5  . thiamine 100 MG tablet Take 1 tablet (100 mg total) by mouth daily. 30 tablet 0  . traMADol (ULTRAM) 50 MG tablet Take 2 tablets (100 mg total) by mouth every 6 (six) hours as needed for severe pain. 20 tablet 0  . varenicline (CHANTIX CONTINUING MONTH PAK) 1 MG tablet Take 1 mg by mouth daily.     Marland Kitchen warfarin (COUMADIN) 5 MG tablet Take 7.5 mg (1.5 tab) MWF and 5 mg all other days or as directed by HF Clinic. 100 tablet 3  . Zinc Gluconate 100 MG TABS Take 1 tablet (100 mg total) by mouth daily. (Patient taking differently: Take 100 mg by mouth 2 (two) times a day. ) 90 tablet 3  . zolpidem (AMBIEN) 5 MG tablet TAKE 1 TABLET BY MOUTH EVERY DAY AT BEDTIME 30 tablet 2  . amitriptyline (ELAVIL) 10 MG tablet Take 1 tablet (10 mg total) by mouth at bedtime. 30 tablet 0  . ceFAZolin (ANCEF) IVPB Inject  2 g into the vein every 8 (eight) hours. Indication:  MSSA LVAD infection Last Day of Therapy:  03/27/2020 Labs - Once weekly:  CBC/D and BMP, Labs - Every other week:  ESR and CRP 165 Units 0  . amLODipine (NORVASC) 5 MG tablet Take 2 tablets (10 mg total) by mouth daily. 180 tablet 3   No facility-administered medications prior to visit.     Allergies  Allergen Reactions  . Chlorhexidine Rash  . Other Rash    Prep pads    Social History   Tobacco Use  . Smoking status: Current Every Day Smoker    Types: Cigarettes    Start date: 2003  . Smokeless tobacco: Never Used  . Tobacco comment: Nicoderm patch   Substance Use Topics  . Alcohol use: Not Currently  . Drug use: Not Currently    No family history on file.  Objective:   Vitals:   03/23/20 0934  Temp: 97.7 F (36.5 C)  TempSrc: Oral  Weight: 187 lb (84.8 kg)   Body mass index is 26.83 kg/m.  Physical Exam Vitals reviewed.  Constitutional:      Appearance: He is well-developed.     Comments: He appears comfortable and seated in chair. In good spirits     HENT:     Head:     Comments: All lesions are resolved. Only a few scars left.     Right Ear: Hearing normal.     Left Ear: Hearing normal. No drainage, swelling or tenderness. No mastoid tenderness.     Nose: Nose normal.     Mouth/Throat:     Dentition: Normal dentition. No dental abscesses.  Cardiovascular:     Rate and Rhythm: Normal rate and regular rhythm.     Comments: LVAD humm, no native heart tones heard.  Pulmonary:     Effort: Pulmonary effort is normal.     Breath sounds: Normal breath sounds.  Abdominal:     Palpations: Abdomen is soft.     Tenderness: There is no abdominal tenderness.     Comments: Abdominal dressings clean and dry. Sites not directly observed, chart photos reviewed of sternal and external driveline site. There appears to be healthy tissue ingrowth without any purulent drainage. The substernal wound has nearly epithelialized and drive line tissue ingrowth looks good. Wound margins are clean and without erythema or swelling.   Lymphadenopathy:     Cervical: No cervical adenopathy.  Skin:    General: Skin is warm and dry.     Findings: No rash.  Neurological:     Mental Status: He is alert and oriented to person, place, and time.  Psychiatric:        Judgment: Judgment normal.   RUE PICC line - clean/dry dressing. Insertion site w/o erythema, tenderness, drainage, cording or distal swelling of affected extremity     Lab Results: Lab Results  Component Value Date   WBC 9.0 03/07/2020   HGB 11.1 (L) 03/07/2020   HCT 34.9 (L) 03/07/2020   MCV 93.6 03/07/2020   PLT 243 03/07/2020    Lab Results  Component Value Date   CREATININE 0.86 03/07/2020   BUN 10 03/07/2020   NA 139 03/07/2020   K 4.1 03/07/2020   CL 108 03/07/2020   CO2 21 (L) 03/07/2020    Sed Rate (mm/hr)  Date Value  02/21/2020 20 (H)  05/05/2019 4   CRP (mg/dL)  Date Value  02/21/2020 1.2 (H)  05/05/2019 <0.8  Assessment & Plan:   Problem List Items Addressed  This Visit      High   Infection associated with driveline of left ventricular assist device (LVAD) ([place])    His wounds are healing nicely. Will go ahead and transition back to oral cephalexin 500 mg QID until they are completely epithelialized then reduce back to TID for chronic suppression for the duration of the life of pump. We did discuss alternative BID option with Doxycycline however he would like to avoid GI side effects that usually come with that and stick with cephalexin.  D/C picc line today.       Relevant Medications   cephALEXin (KEFLEX) 500 MG capsule     Unprioritized   Abscess of sternal region    S/P serial debridements and now nearly resolved. As above.       Disseminated zoster    He has recovered from acute zoster and resolved his neuropathy/tinnitus with adding ALA supplement.  I advised he could continue this if he chooses but would be OK to wean off now. No further valtrex needed. Shingrix vaccine to follow 35mafter his last COVID shot.       Relevant Medications   cephALEXin (KEFLEX) 500 MG capsule      Return in about 2 months (around 05/23/2020).   SJanene Madeira MSN, NP-C RGood Samaritan Hospitalfor Infectious Disease [email or web address] Pager: [phone]: [phone] [phone]

## 2020-03-27 ENCOUNTER — Other Ambulatory Visit (HOSPITAL_COMMUNITY): Payer: Self-pay | Admitting: *Deleted

## 2020-03-27 DIAGNOSIS — Z7901 Long term (current) use of anticoagulants: Secondary | ICD-10-CM

## 2020-03-27 DIAGNOSIS — I5022 Chronic systolic (congestive) heart failure: Secondary | ICD-10-CM

## 2020-03-27 DIAGNOSIS — Z95811 Presence of heart assist device: Secondary | ICD-10-CM

## 2020-03-28 ENCOUNTER — Ambulatory Visit (HOSPITAL_COMMUNITY): Payer: Self-pay | Admitting: Pharmacist

## 2020-03-28 ENCOUNTER — Ambulatory Visit (HOSPITAL_COMMUNITY)
Admission: RE | Admit: 2020-03-28 | Discharge: 2020-03-28 | Disposition: A | Discharge status: Home / Self Care | Payer: Medicare HMO | Source: Ambulatory Visit | Attending: Cardiology | Admitting: Cardiology

## 2020-03-28 ENCOUNTER — Other Ambulatory Visit: Payer: Self-pay

## 2020-03-28 VITALS — BP 98/0 | HR 89 | Ht 71.0 in | Wt 192.0 lb

## 2020-03-28 DIAGNOSIS — Z7901 Long term (current) use of anticoagulants: Secondary | ICD-10-CM | POA: Insufficient documentation

## 2020-03-28 DIAGNOSIS — G4733 Obstructive sleep apnea (adult) (pediatric): Secondary | ICD-10-CM | POA: Diagnosis not present

## 2020-03-28 DIAGNOSIS — Z95811 Presence of heart assist device: Secondary | ICD-10-CM

## 2020-03-28 DIAGNOSIS — F17210 Nicotine dependence, cigarettes, uncomplicated: Secondary | ICD-10-CM | POA: Diagnosis not present

## 2020-03-28 DIAGNOSIS — I428 Other cardiomyopathies: Secondary | ICD-10-CM | POA: Insufficient documentation

## 2020-03-28 DIAGNOSIS — E785 Hyperlipidemia, unspecified: Secondary | ICD-10-CM | POA: Insufficient documentation

## 2020-03-28 DIAGNOSIS — I5022 Chronic systolic (congestive) heart failure: Secondary | ICD-10-CM | POA: Insufficient documentation

## 2020-03-28 DIAGNOSIS — K7460 Unspecified cirrhosis of liver: Secondary | ICD-10-CM | POA: Diagnosis not present

## 2020-03-28 DIAGNOSIS — Z86718 Personal history of other venous thrombosis and embolism: Secondary | ICD-10-CM | POA: Diagnosis not present

## 2020-03-28 DIAGNOSIS — Z79899 Other long term (current) drug therapy: Secondary | ICD-10-CM | POA: Insufficient documentation

## 2020-03-28 DIAGNOSIS — Z7982 Long term (current) use of aspirin: Secondary | ICD-10-CM | POA: Diagnosis not present

## 2020-03-28 DIAGNOSIS — Y838 Other surgical procedures as the cause of abnormal reaction of the patient, or of later complication, without mention of misadventure at the time of the procedure: Secondary | ICD-10-CM | POA: Insufficient documentation

## 2020-03-28 DIAGNOSIS — E119 Type 2 diabetes mellitus without complications: Secondary | ICD-10-CM | POA: Diagnosis not present

## 2020-03-28 DIAGNOSIS — K219 Gastro-esophageal reflux disease without esophagitis: Secondary | ICD-10-CM | POA: Insufficient documentation

## 2020-03-28 DIAGNOSIS — T827XXA Infection and inflammatory reaction due to other cardiac and vascular devices, implants and grafts, initial encounter: Secondary | ICD-10-CM | POA: Diagnosis present

## 2020-03-28 LAB — LACTATE DEHYDROGENASE: LDH: 211 U/L — ABNORMAL HIGH (ref 98–192)

## 2020-03-28 LAB — COMPREHENSIVE METABOLIC PANEL
ALT: 9 U/L (ref 0–44)
AST: 17 U/L (ref 15–41)
Albumin: 3.9 g/dL (ref 3.5–5.0)
Alkaline Phosphatase: 128 U/L — ABNORMAL HIGH (ref 38–126)
Anion gap: 10 (ref 5–15)
BUN: 12 mg/dL (ref 8–23)
CO2: 23 mmol/L (ref 22–32)
Calcium: 9.5 mg/dL (ref 8.9–10.3)
Chloride: 106 mmol/L (ref 98–111)
Creatinine, Ser: 0.92 mg/dL (ref 0.61–1.24)
GFR calc Af Amer: >60 mL/min (ref >60)
GFR calc non Af Amer: >60 mL/min (ref >60)
Glucose, Bld: 154 mg/dL — ABNORMAL HIGH (ref 70–99)
Potassium: 4.5 mmol/L (ref 3.5–5.1)
Sodium: 139 mmol/L (ref 135–145)
Total Bilirubin: 0.8 mg/dL (ref 0.3–1.2)
Total Protein: 7.6 g/dL (ref 6.5–8.1)

## 2020-03-28 LAB — CBC
HCT: 40.2 % (ref 39.0–52.0)
Hemoglobin: 12.8 g/dL — ABNORMAL LOW (ref 13.0–17.0)
MCH: 29.8 pg (ref 26.0–34.0)
MCHC: 31.8 g/dL (ref 30.0–36.0)
MCV: 93.7 fL (ref 80.0–100.0)
Platelets: 302 10*3/uL (ref 150–400)
RBC: 4.29 MIL/uL (ref 4.22–5.81)
RDW: 13.7 % (ref 11.5–15.5)
WBC: 8 10*3/uL (ref 4.0–10.5)
nRBC: 0 % (ref 0.0–0.2)

## 2020-03-28 LAB — PROTIME-INR
INR: 2 — ABNORMAL HIGH (ref 0.8–1.2)
Prothrombin Time: 22.1 seconds — ABNORMAL HIGH (ref 11.4–15.2)

## 2020-03-28 MED ORDER — GABAPENTIN 300 MG PO CAPS: 600.0000 mg | ORAL_CAPSULE | Freq: Four times a day (QID) | ORAL | 6 refills | Status: DC

## 2020-03-28 NOTE — Patient Instructions (Signed)
1. No change in medications 2. Sternal dressing and driveline dressing every other. 3. Return to clinic next week for dressing change and labs and 2 mo to see Dr. Shirlee Latch.

## 2020-03-28 NOTE — Progress Notes (Signed)
LVAD INR 

## 2020-03-28 NOTE — Progress Notes (Addendum)
Patient presents for hospital f/u in VAD Clinic today with his wife. Reports no problems with VAD equipment or concerns with drive line. Denies any falls, dizziness, lightheadedness, or syncope.   Pt was hospitalized 3/9-3/24 for sternal wound and driveline debridement pt was on IV antibiotics but is now on Keflex 500 mg qid.  His wife has continued daily dressing changes on his sternum and every other day on his driveline.   Vital Signs:  Temp:  89 Doppler Pressure: 98 Automatc BP: 123/61 (90) HR: 89 SPO2: 100% RA  Weight: 199.6 lb w/o eqt Last weight: 184.2 lb  VAD Indication: Destination Therapy due to smoking status  VAD interrogation & Equipment Management: Speed: 5700 Flow:  4.6 Power:  4.5w    PI: 4.5 Hct: 35  Alarms: no clinical alarms Events: 50 - 60+ PI events daily Fixed speed: 5700 Low speed limit: 5400  Primary Controller:  Replace back up battery in 13 months. Back up controller:   Replace back up battery in 26 months.  Annual Equipment Maintenance on UBC/PM was performed on 07/2019.   Back up battery charged in back up controller during clinic visit.   I reviewed the LVAD parameters from today and compared the results to the patient's prior recorded data. LVAD interrogation was NEGATIVE for significant power changes, NEGATIVE for clinical alarms and NEGATIVE for PI events/speed drops. No programming changes were made and pump is functioning within specified parameters. Pt is performing daily controller and system monitor self tests along with completing weekly and monthly maintenance for LVAD equipment.  LVAD equipment check completed and is in good working order. Back-up equipment present.   Sternal wound:  Existing dressing removed using sterile technique. Scant yellow drainage. Cleansed site with betadine swab x 2 and allowed to dry. Cellerate applied to wound bed. Covered with mepitel, and saline soaked 2 x 2s. Covered with sterile gauze and tape.        Exit Site Care: Drive line is being maintained daily by [name]. Gauze dressing with anchor intact and accurately applied.  Existing dressing removed using sterile technique. Site cleansed with betadine swab x 2 and allowed to dry. Covered site with gauze dressing. Pt has small amount of thick yellow drainage on the gauze with no foul odor, rash, or tenderness noted. Hypergranulation tissues noted. Exit site with complete tissue ingrowth. Site was re cultured today.Tunnels 1 cm along outer portion of exit site. Pt given 14 daily dressings for home     Device: N/A   BP & Labs:  Doppler 98 - Doppler is reflecting MAP   Hgb 12.8 - No S/S of bleeding. Specifically denies melena/BRBPR or nosebleeds.   LDH stable at 211 with established baseline of 150 - 220. Denies tea-colored urine. No power elevations noted on interrogation.   1.5 year Intermacs follow up completed including:  Quality of Life, KCCQ-12, and Neurocognitive trail making.   Pt completed 1410 feet during 6 minute walk.  Back up controller:  11V backup battery charged during this visit.  Patient Instructions: 1. No change in medications 2. Sternal dressing and driveline dressing every other day 3. Return to clinic next week for dressing change and labs and 2 mo to see Dr. Shirlee Latch.   [name] RN VAD Coordinator  Office: [phone]  24/7 Pager: [phone]     59 y.o. with history of nonischemic cardiomyopathy, RLE DVT, cirrhosis, smoking, and OSA returns for followup of CHF/LVAD placement.  Cardiomyopathy was diagnosed in 6/19 in [place], [place] at  that time showed low output.  He was admitted to [facility] in 9/19 with low output HF and was started on milrinone and diuresed.  Unable to wean off milrinone.  He had a degree of RV failure, but this improved on milrinone.  Valvular heart disease also looked better with milrinone and diuresis.  On 08/03/18, he had Heartmate 3 LVAD placed.  Speed was optimized by ramp echo  post-op.  Post-op course was relatively unremarkable.  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  No abscess on CT.  He was started on cefazolin IV for 6 wks.  BP-active meds decreased with low MAP.   He was admitted in 3/21 with subxiphoid abscess and cellulitis.  CT showed collection along the course of the driveline.  There was no evidence for sternal osteomyelitis.  Abscess was debrided, MSSA grew from wound cultures.  Wound vac was placed.  Patient was started on cefazolin, which was recently stopped after completing 8 wks.   Patient was re-admitted later in 3/21 with facial Zoster.  He was treated with acyclovir and this has resolved.   He returns today for followup of LVAD.  Currently taking cephalexin qid.  MAP 90 today.  Still with frequent PIs (this is chronic) but less than in the past.  His abscess wound site is now healing nicely (see picture above).  He is now smoking 2 cigarettes/day.  He is active, minimal dyspnea.  No lightheadedness.  MAP stable at 90 today.   Denies LVAD alarms.  Reports taking Coumadin as prescribed and adherence to anticoagulation based dietary restrictions.  Denies bright red blood per rectum or melena, no dark urine or hematuria.    Labs (9/19): LDH 210, INR 2.17, WBCs 17.1 => 15, hgb 9.7, creatinine 0.72 Labs (10/19): K 4.3, creatinine 0.78 => 0.83, hgb 10.2 Labs (11/19): creatinine 0.79, hgb 10 Labs (2/20): K 3.9, creatinine 0.79 Labs (4/20): K 4, creatinine 1.15, hgb 11.4, LDH 199, INR 1.9 Labs (5/20): hgb 10.4, WBCs 8.5 Labs (6/20): K 4.2, creatinine 1.06, hgb 11.2 Labs (8/20): k 3.7, creatinine 1.0, hgb 11.5, LDH 186 Labs (10/20): K 3.5, creatinine 0.93, INR 2, LDH 221 Labs (12/20): hgb 15.6, K 3.7, creatinine 1.02, LDH 250 Labs (4/21): K 4.1, creatinine 0.86, hgb 11.1  PMH: 1. Degenerative disc disease.  2. GERD 3. Chronic systolic CHF:  Nonischemic  cardiomyopathy.  Dilated cardiomyopathy diagnosed 6/19 in [place].  LHC/RHC in 7/19 showed elevated filling pressures, low cardiac output, and no significant CAD.   - Echo (9/19): Severe LV dilation with EF 10-20%, moderate-severe MR, severely dilated RV with mildly decreased systolic function, severe TR.  - Cardiac MRI (9/19): EF 14%, moderate dilated LV, severely dilated RV with mod-severe systolic function and EF 25%, nonspecific RV insertion site LGE.  - RHC (5/19) on milrinone 0.375: mean RA 8, PA 40/19, mean PCWP 12, PAPi 2.65, CI 2.71.  - Echo (9/19) on milrinone and diuresed): EF 20-25% with moderate LV dilation, moderately dilated RV with mildly decreased systolic function, mild-moderate MR, moderate TR, PASP 51 mmHg.  Cannot rule out noncompaction.  - Heartmate 3 LVAD placement in 9/19.  4. OSA: CPAP use.  5. Prior smoker 6. Type 2 diabetes 7. Hyperlipidemia.  8. H/o NSVT 9. 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  10. RLE DVT: found in 9/19.  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 Subxiphoid abscess involving  collection along driveline in 3/21, MSSA.   12. Facial Zoster 3/21  Current Outpatient Medications  Medication Sig Dispense Refill  . amLODipine (NORVASC) 5 MG tablet Take 2 tablets (10 mg total) by mouth daily. 180 tablet 3  . aspirin EC 81 MG tablet Take 81 mg by mouth daily.    . carbamide peroxide (DEBROX) 6.5 % OTIC solution Place 5 drops into both ears 3 (three) times daily. (Patient taking differently: Place 5 drops into both ears 2 (two) times daily. ) 15 mL 0  . cephALEXin (KEFLEX) 500 MG capsule Take 1 capsule (500 mg total) by mouth 3 (three) times daily. (Patient taking differently: Take 500 mg by mouth 4 (four) times daily. ) 90 capsule 11  . cyanocobalamin 500 MCG tablet Take 1,000 mcg by mouth daily.     . fluticasone (FLONASE) 50 MCG/ACT nasal spray Place 1 spray into both nostrils 2 (two) times daily as  needed for allergies.     Marland Kitchen gabapentin (NEURONTIN) 300 MG capsule Take 2 capsules (600 mg total) by mouth 4 (four) times daily. 240 capsule 6  . glucose blood (CONTOUR NEXT TEST) test strip Use as instructed 100 each 12  . hydrALAZINE (APRESOLINE) 50 MG tablet Take 1 tablet (50 mg total) by mouth 3 (three) times daily. 90 tablet 5  . Ipratropium-Albuterol (COMBIVENT) 20-100 MCG/ACT AERS respimat Inhale 1 puff into the lungs every 6 (six) hours as needed for wheezing or shortness of breath.     . Lancets (FREESTYLE) lancets Use as instructed 100 each 12  . nystatin (MYCOSTATIN/NYSTOP) powder Apply 1 application topically 3 (three) times daily. 15 g 3  . pantoprazole (PROTONIX) 40 MG tablet Take 1 tablet (40 mg total) by mouth daily. 30 tablet 2  . sacubitril-valsartan (ENTRESTO) 97-103 MG Take 1 tablet by mouth 2 (two) times daily. 60 tablet 6  . sildenafil (VIAGRA) 100 MG tablet Take 50 mg by mouth daily as needed for erectile dysfunction.    Marland Kitchen spironolactone (ALDACTONE) 25 MG tablet Take 1 tablet (25 mg total) by mouth daily. 30 tablet 5  . thiamine 100 MG tablet Take 1 tablet (100 mg total) by mouth daily. 30 tablet 0  . varenicline (CHANTIX CONTINUING MONTH PAK) 1 MG tablet Take 1 mg by mouth daily.     Marland Kitchen warfarin (COUMADIN) 5 MG tablet Take 7.5 mg (1.5 tab) MWF and 5 mg all other days or as directed by HF Clinic. 100 tablet 3  . Zinc Gluconate 100 MG TABS Take 1 tablet (100 mg total) by mouth daily. (Patient taking differently: Take 100 mg by mouth 2 (two) times a day. ) 90 tablet 3  . zolpidem (AMBIEN) 5 MG tablet TAKE 1 TABLET BY MOUTH EVERY DAY AT BEDTIME 30 tablet 2  . docusate sodium (COLACE) 100 MG capsule Take 2 capsules (200 mg total) by mouth daily as needed for mild constipation. (Patient not taking: Reported on 03/28/2020) 10 capsule 0  . traMADol (ULTRAM) 50 MG tablet Take 2 tablets (100 mg total) by mouth every 6 (six) hours as needed for severe pain. (Patient not taking: Reported on  03/28/2020) 20 tablet 0   No current facility-administered medications for this encounter.    Chlorhexidine and Other  REVIEW OF SYSTEMS: All systems negative except as listed in HPI, PMH and Problem list.   LVAD INTERROGATION:  Please see LVAD nurse's note above for details.    I reviewed the LVAD parameters from today, and compared the results to the  patient's prior recorded data.  No programming changes were made.  The LVAD is functioning within specified parameters.  The patient performs LVAD self-test daily.  LVAD interrogation was negative for any significant power changes, alarms or PI events/speed drops.  LVAD equipment check completed and is in good working order.  Back-up equipment present.   LVAD education done on emergency procedures and precautions and reviewed exit site care.   MAP 90  Physical Exam: General: Well appearing this am. NAD.  HEENT: Normal. Neck: Supple, JVP 7-8 cm. Carotids OK.  Cardiac:  Mechanical heart sounds with LVAD hum present.  Lungs:  CTAB, normal effort.  Abdomen:  NT, ND, no HSM. No bruits or masses. +BS  LVAD exit site: Well-healed and incorporated. Dressing dry and intact. No erythema or drainage. Stabilization device present and accurately applied. Driveline dressing changed daily per sterile technique. Extremities:  Warm and dry. No cyanosis, clubbing, rash, or edema.  Neuro:  Alert & oriented x 3. Cranial nerves grossly intact. Moves all 4 extremities w/o difficulty. Affect pleasant      ASSESSMENT AND PLAN:   1. 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  LVAD parameters stable but still with multiple PIs, no suction.  Frequent PIs may be related to systemic hypertension, seem to be improving now with lower MAP.  NYHA class I-II.   He does not look volume overloaded. MAP better on current regimen.   - He does not need Lasix.  Encouraged hydration.    - Continue spironolactone 25 mg daily.  -  Continue Entresto to 97/103 bid.  - Continue amlodipine 5 mg daily.  - Continue ASA 81 daily.  - Continue warfarin for INR 2-2.5.  No evidence for GI bleeding. Pending hgb.  - Should be transplant candidate eventually if he can quit smoking.  Will make transplant clinic referral when he is off totally.  2. Smoking: He has cut back a lot, still taking Chantix. Encouraged to quit.  3. RLE DVT: On warfarin for LVAD.  4. OSA: Continue CPAP.  5. Hyperlipidemia: Atorvastatin.  6. Type II diabetes: Per PCP.  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 Driveline site and abscess site look much better.     Followup in 2 months.   Loralie Champagne 03/28/2020

## 2020-03-29 ENCOUNTER — Telehealth (HOSPITAL_COMMUNITY): Payer: Self-pay

## 2020-03-29 NOTE — Telephone Encounter (Signed)
HH orders signed and faxed to [facility]

## 2020-03-30 LAB — AEROBIC CULTURE W GRAM STAIN (SUPERFICIAL SPECIMEN): Culture: NO GROWTH

## 2020-03-31 ENCOUNTER — Other Ambulatory Visit (HOSPITAL_COMMUNITY): Payer: Self-pay | Admitting: Unknown Physician Specialty

## 2020-03-31 DIAGNOSIS — Z95811 Presence of heart assist device: Secondary | ICD-10-CM

## 2020-03-31 DIAGNOSIS — Z7901 Long term (current) use of anticoagulants: Secondary | ICD-10-CM

## 2020-04-04 ENCOUNTER — Ambulatory Visit (HOSPITAL_COMMUNITY): Payer: Self-pay | Admitting: Pharmacist

## 2020-04-04 ENCOUNTER — Ambulatory Visit (HOSPITAL_COMMUNITY)
Admission: RE | Admit: 2020-04-04 | Discharge: 2020-04-04 | Disposition: A | Discharge status: Home / Self Care | Payer: Medicare HMO | Source: Ambulatory Visit | Attending: Internal Medicine | Admitting: Internal Medicine

## 2020-04-04 ENCOUNTER — Other Ambulatory Visit: Payer: Self-pay

## 2020-04-04 DIAGNOSIS — Z7901 Long term (current) use of anticoagulants: Secondary | ICD-10-CM | POA: Insufficient documentation

## 2020-04-04 DIAGNOSIS — Z95811 Presence of heart assist device: Secondary | ICD-10-CM

## 2020-04-04 LAB — PROTIME-INR
INR: 2 — ABNORMAL HIGH (ref 0.8–1.2)
Prothrombin Time: 22.1 seconds — ABNORMAL HIGH (ref 11.4–15.2)

## 2020-04-04 NOTE — Addendum Note (Signed)
Encounter addended by: Levonne Spiller, RN on: 04/04/2020 11:03 AM  Actions taken: Clinical Note Signed

## 2020-04-04 NOTE — Progress Notes (Signed)
Pt presented to VAD clinic alone for dressing change. Pt reports he just returned from trip to [facility] visiting family.   Sternal wound:  Wife performing every other day dressing changes using saline soaked 2 x 2's covered with dry dressing. Existing dressing removed using sterile technique. Scant yellow drainage. Cleansed site with betadine swab x 2 and allowed to dry. Covered with sterile gauze and tape.     Exit site: Wife performing dressing changes every two days using saline soaked 2x2's and covering with gauze. Existing drive line dressing removed using sterile technique. Small amount thick yellow drainage with no foul odor. Hypergranulated tissue around exit site with no redness, tenderness, velour exposed @ 2 cm with partial tissue ingrowth.  Cleansed with betadine swab x 2 and allowed to dry. Covered with dry sterile gauze and tape. Anchor intact. Instructed pt to continue every other day dressing changes, cover with dry sterile gauze.     Pt is to return next Tuesday for dressing change.    Hessie Diener RN VAD Coordinator  Office: [phone]  24/7 Pager: [phone]

## 2020-04-04 NOTE — Progress Notes (Signed)
LVAD INR 

## 2020-04-07 ENCOUNTER — Other Ambulatory Visit (HOSPITAL_COMMUNITY): Payer: Self-pay | Admitting: *Deleted

## 2020-04-07 DIAGNOSIS — Z7901 Long term (current) use of anticoagulants: Secondary | ICD-10-CM

## 2020-04-07 DIAGNOSIS — Z95811 Presence of heart assist device: Secondary | ICD-10-CM

## 2020-04-09 ENCOUNTER — Other Ambulatory Visit (HOSPITAL_COMMUNITY): Payer: Self-pay | Admitting: *Deleted

## 2020-04-09 ENCOUNTER — Telehealth (HOSPITAL_COMMUNITY): Payer: Self-pay | Admitting: *Deleted

## 2020-04-09 DIAGNOSIS — Z95811 Presence of heart assist device: Secondary | ICD-10-CM

## 2020-04-09 NOTE — Telephone Encounter (Signed)
Received call from patient stating his sternal wound started bleeding last night. He was sitting in his recliner and noticed his shirt had blood on it. Wife [name] notes "small blackhead-like hole" that the blood is coming from. Denies trauma. INR 2.0.  [name] has been changing sternal dressing 1-2 times per day since Friday. Denies pain, fever, or chills.   Will bring to clinic tomorrow for labs and dressing change. Dr Donata Clay made aware of the above.     Alyce Pagan RN VAD Coordinator  Office: [phone]  24/7 Pager: [phone]

## 2020-04-10 ENCOUNTER — Ambulatory Visit (HOSPITAL_COMMUNITY): Payer: Self-pay | Admitting: Pharmacist

## 2020-04-10 ENCOUNTER — Other Ambulatory Visit: Payer: Self-pay

## 2020-04-10 ENCOUNTER — Ambulatory Visit (HOSPITAL_COMMUNITY)
Admission: RE | Admit: 2020-04-10 | Discharge: 2020-04-10 | Disposition: A | Discharge status: Home / Self Care | Payer: Medicare HMO | Source: Ambulatory Visit | Attending: Internal Medicine | Admitting: Internal Medicine

## 2020-04-10 DIAGNOSIS — Z95811 Presence of heart assist device: Secondary | ICD-10-CM

## 2020-04-10 DIAGNOSIS — Z7901 Long term (current) use of anticoagulants: Secondary | ICD-10-CM | POA: Diagnosis not present

## 2020-04-10 LAB — CBC
HCT: 36.6 % — ABNORMAL LOW (ref 39.0–52.0)
Hemoglobin: 11.5 g/dL — ABNORMAL LOW (ref 13.0–17.0)
MCH: 29 pg (ref 26.0–34.0)
MCHC: 31.4 g/dL (ref 30.0–36.0)
MCV: 92.2 fL (ref 80.0–100.0)
Platelets: 318 10*3/uL (ref 150–400)
RBC: 3.97 MIL/uL — ABNORMAL LOW (ref 4.22–5.81)
RDW: 13.2 % (ref 11.5–15.5)
WBC: 9.5 10*3/uL (ref 4.0–10.5)
nRBC: 0 % (ref 0.0–0.2)

## 2020-04-10 LAB — PROTIME-INR
INR: 2.1 — ABNORMAL HIGH (ref 0.8–1.2)
Prothrombin Time: 22.8 seconds — ABNORMAL HIGH (ref 11.4–15.2)

## 2020-04-10 NOTE — Progress Notes (Signed)
LVAD INR 

## 2020-04-10 NOTE — Progress Notes (Signed)
Pt presented to VAD clinic with [facility] for dressing change.   Reports sternal wound has been bleeding since Friday. [name] has had to change sternal dressing 1-2 times per day since Friday. CBC & INR drawn today.   Sternal wound:  Wife performing daily dressing changes using 4 x 4s. Existing dressing removed using sterile technique. Moderate amount of bloody drainage. Cleansed site with betadine swab x 2 and allowed to dry. Covered site with Cellerate gel. Topped with saline moistened 2 x 2 and covered with sterile 4 x 4 gauze and tape.     Exit site: Wife performing dressing changes every other day using saline soaked 2x2's and covering with gauze. Existing drive line dressing removed using sterile technique. Scant amount thick yellow drainage with no foul odor. Hypergranulated tissue around exit site with no redness, tenderness, velour exposed @ 2 cm with partial tissue ingrowth.  Cleansed with betadine swab x 2 and allowed to dry. Covered with dry sterile gauze and tape. NO SILVER STRIP. Anchor intact. Instructed pt to continue every other day dressing changes, cover with dry sterile gauze.      Pt is to return next Wednesday for dressing change. Pt to notify VAD coordinators if bleeding continues.   Alyce Pagan RN VAD Coordinator  Office: [phone]  24/7 Pager: [phone]

## 2020-04-10 NOTE — Addendum Note (Signed)
Encounter addended by: Bernita Raisin, RN on: 04/10/2020 2:02 PM  Actions taken: Clinical Note Signed

## 2020-04-11 ENCOUNTER — Telehealth (HOSPITAL_COMMUNITY): Payer: Self-pay

## 2020-04-11 ENCOUNTER — Other Ambulatory Visit (HOSPITAL_COMMUNITY): Payer: Medicare HMO

## 2020-04-11 NOTE — Telephone Encounter (Signed)
HH orders signed and faxed back to [facility]

## 2020-04-12 ENCOUNTER — Ambulatory Visit (HOSPITAL_COMMUNITY)
Admission: RE | Admit: 2020-04-12 | Discharge: 2020-04-12 | Disposition: A | Discharge status: Home / Self Care | Payer: Medicare HMO | Source: Ambulatory Visit | Attending: Internal Medicine | Admitting: Internal Medicine

## 2020-04-12 ENCOUNTER — Other Ambulatory Visit: Payer: Self-pay

## 2020-04-12 DIAGNOSIS — A4901 Methicillin susceptible Staphylococcus aureus infection, unspecified site: Secondary | ICD-10-CM | POA: Insufficient documentation

## 2020-04-12 DIAGNOSIS — Z4801 Encounter for change or removal of surgical wound dressing: Secondary | ICD-10-CM | POA: Insufficient documentation

## 2020-04-12 DIAGNOSIS — T827XXA Infection and inflammatory reaction due to other cardiac and vascular devices, implants and grafts, initial encounter: Secondary | ICD-10-CM | POA: Insufficient documentation

## 2020-04-12 NOTE — Progress Notes (Signed)
Pt presented to VAD clinic with [facility] for dressing change with Dr Donata Clay.   Pt reports continued bloody drainage from sternal wound. [name] is changing dressing twice daily.   Sternal wound: Existing dressing removed using sterile technique. Moderate amount of bloody drainage. Cleansed site with betadine swab x 2 and allowed to dry. Dr Donata Clay opened sternal wound using cotton tip applicator. Site tunnels 3 cm. Silver nitrate swabs to wound bed. Packed site with saline soaked 1/2 inch plain packing gauze. Topped with 2 x 2s and covered with sterile 4 x 4 gauze and tape.      Exit site: Existing drive line dressing removed using sterile technique. Cleansed with betadine swab x 2 and allowed to dry. Covered with dry sterile gauze and tape. NO SILVER STRIP. Small amount thick yellow drainage with no foul odor. Hypergranulated tissue around exit site with no redness, tenderness, velour exposed @ 2 cm with partial tissue ingrowth. Silver nitrate to hypergranulation tissue at exit site.  Anchor intact. Instructed pt to continue every other day dressing changes, cover with dry sterile gauze.       Pt is to return Friday  for dressing change. Pt to notify VAD coordinators if bleeding worsens.   Alyce Pagan RN VAD Coordinator  Office: [phone]  24/7 Pager: [phone]

## 2020-04-14 ENCOUNTER — Ambulatory Visit (HOSPITAL_COMMUNITY)
Admission: RE | Admit: 2020-04-14 | Discharge: 2020-04-14 | Disposition: A | Discharge status: Home / Self Care | Payer: Medicare HMO | Source: Ambulatory Visit | Attending: Internal Medicine | Admitting: Internal Medicine

## 2020-04-14 ENCOUNTER — Other Ambulatory Visit: Payer: Self-pay

## 2020-04-14 ENCOUNTER — Other Ambulatory Visit (HOSPITAL_COMMUNITY): Payer: Self-pay | Admitting: Unknown Physician Specialty

## 2020-04-14 DIAGNOSIS — T827XXA Infection and inflammatory reaction due to other cardiac and vascular devices, implants and grafts, initial encounter: Secondary | ICD-10-CM | POA: Insufficient documentation

## 2020-04-14 DIAGNOSIS — X58XXXA Exposure to other specified factors, initial encounter: Secondary | ICD-10-CM | POA: Insufficient documentation

## 2020-04-14 DIAGNOSIS — Z7901 Long term (current) use of anticoagulants: Secondary | ICD-10-CM

## 2020-04-14 DIAGNOSIS — R579 Shock, unspecified: Secondary | ICD-10-CM | POA: Diagnosis not present

## 2020-04-14 LAB — AEROBIC CULTURE W GRAM STAIN (SUPERFICIAL SPECIMEN)

## 2020-04-14 MED ORDER — DOXYCYCLINE HYCLATE 100 MG PO CAPS: 100.0000 mg | ORAL_CAPSULE | Freq: Two times a day (BID) | ORAL | 0 refills | Status: DC

## 2020-04-14 NOTE — Progress Notes (Signed)
Pt presented to VAD clinic with [facility] for dressing change with Dr Donata Clay.   Pt reports continued bloody drainage from sternal wound. [name] had to change the dressing yesterday.   Sternal wound: Existing dressing removed using sterile technique. Moderate amount of bloody drainage. Cleansed site with betadine swab x 2 and allowed to dry. Site tunnels 5 cm. Silver nitrate swabs to wound bed. Packed site with saline soaked 1/2 inch plain packing gauze. Topped with 2 x 2s and covered with sterile 4 x 4 gauze and tape.  Final culture results from 5/26: few Staphyloccous aureus        Exit site: Existing drive line dressing removed using sterile technique. Cleansed with betadine swab x 2 and allowed to dry. Covered with saline moistened 2 x 2.. NO SILVER STRIP. Small amount thick yellow drainage with no foul odor. Hypergranulated tissue around exit site with no redness, tenderness, velour exposed @ 2 cm with partial tissue ingrowth. Silver nitrate to hypergranulation tissue at exit site.  Anchor intact.       Plan: 1. Start Doxycycline 100 mg bid for 2 weeks 2. Increase Dressing changes on both sternum and driveline as instructed today by Dr. Donata Clay 3. Return to clinic on Tuesday  [name] RN VAD Coordinator  Office: [phone]  24/7 Pager: [phone]

## 2020-04-16 ENCOUNTER — Other Ambulatory Visit: Payer: Self-pay

## 2020-04-16 ENCOUNTER — Inpatient Hospital Stay (HOSPITAL_COMMUNITY)
Admission: EM | Admit: 2020-04-16 | Discharge: 2020-05-06 | DRG: 981 | Disposition: A | Discharge status: Home / Self Care | Payer: Medicare HMO | Attending: Cardiology | Admitting: Cardiology

## 2020-04-16 ENCOUNTER — Emergency Department (HOSPITAL_COMMUNITY): Payer: Medicare HMO

## 2020-04-16 ENCOUNTER — Inpatient Hospital Stay (HOSPITAL_COMMUNITY): Payer: Medicare HMO

## 2020-04-16 DIAGNOSIS — M549 Dorsalgia, unspecified: Secondary | ICD-10-CM | POA: Diagnosis present

## 2020-04-16 DIAGNOSIS — K567 Ileus, unspecified: Secondary | ICD-10-CM | POA: Diagnosis not present

## 2020-04-16 DIAGNOSIS — T827XXA Infection and inflammatory reaction due to other cardiac and vascular devices, implants and grafts, initial encounter: Principal | ICD-10-CM

## 2020-04-16 DIAGNOSIS — K9189 Other postprocedural complications and disorders of digestive system: Secondary | ICD-10-CM | POA: Diagnosis not present

## 2020-04-16 DIAGNOSIS — I5022 Chronic systolic (congestive) heart failure: Secondary | ICD-10-CM | POA: Diagnosis present

## 2020-04-16 DIAGNOSIS — L02213 Cutaneous abscess of chest wall: Secondary | ICD-10-CM | POA: Diagnosis not present

## 2020-04-16 DIAGNOSIS — E876 Hypokalemia: Secondary | ICD-10-CM | POA: Diagnosis not present

## 2020-04-16 DIAGNOSIS — Z7901 Long term (current) use of anticoagulants: Secondary | ICD-10-CM

## 2020-04-16 DIAGNOSIS — I428 Other cardiomyopathies: Secondary | ICD-10-CM | POA: Diagnosis present

## 2020-04-16 DIAGNOSIS — Y831 Surgical operation with implant of artificial internal device as the cause of abnormal reaction of the patient, or of later complication, without mention of misadventure at the time of the procedure: Secondary | ICD-10-CM | POA: Diagnosis present

## 2020-04-16 DIAGNOSIS — K7460 Unspecified cirrhosis of liver: Secondary | ICD-10-CM | POA: Diagnosis present

## 2020-04-16 DIAGNOSIS — R61 Generalized hyperhidrosis: Secondary | ICD-10-CM | POA: Diagnosis present

## 2020-04-16 DIAGNOSIS — I5043 Acute on chronic combined systolic (congestive) and diastolic (congestive) heart failure: Secondary | ICD-10-CM | POA: Diagnosis not present

## 2020-04-16 DIAGNOSIS — T148XXA Other injury of unspecified body region, initial encounter: Secondary | ICD-10-CM

## 2020-04-16 DIAGNOSIS — M869 Osteomyelitis, unspecified: Secondary | ICD-10-CM | POA: Diagnosis present

## 2020-04-16 DIAGNOSIS — Z20822 Contact with and (suspected) exposure to covid-19: Secondary | ICD-10-CM | POA: Diagnosis present

## 2020-04-16 DIAGNOSIS — U071 COVID-19: Secondary | ICD-10-CM | POA: Diagnosis present

## 2020-04-16 DIAGNOSIS — L0390 Cellulitis, unspecified: Secondary | ICD-10-CM | POA: Diagnosis present

## 2020-04-16 DIAGNOSIS — Z95811 Presence of heart assist device: Secondary | ICD-10-CM | POA: Diagnosis not present

## 2020-04-16 DIAGNOSIS — A4901 Methicillin susceptible Staphylococcus aureus infection, unspecified site: Secondary | ICD-10-CM | POA: Diagnosis not present

## 2020-04-16 DIAGNOSIS — Z0189 Encounter for other specified special examinations: Secondary | ICD-10-CM

## 2020-04-16 DIAGNOSIS — E785 Hyperlipidemia, unspecified: Secondary | ICD-10-CM | POA: Diagnosis present

## 2020-04-16 DIAGNOSIS — D62 Acute posthemorrhagic anemia: Secondary | ICD-10-CM | POA: Diagnosis present

## 2020-04-16 DIAGNOSIS — Z86718 Personal history of other venous thrombosis and embolism: Secondary | ICD-10-CM

## 2020-04-16 DIAGNOSIS — G4733 Obstructive sleep apnea (adult) (pediatric): Secondary | ICD-10-CM | POA: Diagnosis present

## 2020-04-16 DIAGNOSIS — B9561 Methicillin susceptible Staphylococcus aureus infection as the cause of diseases classified elsewhere: Secondary | ICD-10-CM | POA: Diagnosis present

## 2020-04-16 DIAGNOSIS — Z8711 Personal history of peptic ulcer disease: Secondary | ICD-10-CM

## 2020-04-16 DIAGNOSIS — G8929 Other chronic pain: Secondary | ICD-10-CM | POA: Diagnosis present

## 2020-04-16 DIAGNOSIS — J95811 Postprocedural pneumothorax: Secondary | ICD-10-CM

## 2020-04-16 DIAGNOSIS — E119 Type 2 diabetes mellitus without complications: Secondary | ICD-10-CM | POA: Diagnosis present

## 2020-04-16 DIAGNOSIS — F17210 Nicotine dependence, cigarettes, uncomplicated: Secondary | ICD-10-CM | POA: Diagnosis present

## 2020-04-16 DIAGNOSIS — R7881 Bacteremia: Secondary | ICD-10-CM | POA: Diagnosis present

## 2020-04-16 DIAGNOSIS — Z79899 Other long term (current) drug therapy: Secondary | ICD-10-CM

## 2020-04-16 DIAGNOSIS — Z8619 Personal history of other infectious and parasitic diseases: Secondary | ICD-10-CM

## 2020-04-16 DIAGNOSIS — Z79891 Long term (current) use of opiate analgesic: Secondary | ICD-10-CM

## 2020-04-16 DIAGNOSIS — R579 Shock, unspecified: Secondary | ICD-10-CM | POA: Diagnosis present

## 2020-04-16 DIAGNOSIS — I110 Hypertensive heart disease with heart failure: Secondary | ICD-10-CM | POA: Diagnosis present

## 2020-04-16 DIAGNOSIS — I9789 Other postprocedural complications and disorders of the circulatory system, not elsewhere classified: Secondary | ICD-10-CM | POA: Diagnosis not present

## 2020-04-16 DIAGNOSIS — R609 Edema, unspecified: Secondary | ICD-10-CM | POA: Diagnosis not present

## 2020-04-16 LAB — COMPREHENSIVE METABOLIC PANEL
ALT: 14 U/L (ref 0–44)
AST: 19 U/L (ref 15–41)
Albumin: 3.4 g/dL — ABNORMAL LOW (ref 3.5–5.0)
Alkaline Phosphatase: 105 U/L (ref 38–126)
Anion gap: 11 (ref 5–15)
BUN: 12 mg/dL (ref 8–23)
CO2: 21 mmol/L — ABNORMAL LOW (ref 22–32)
Calcium: 9.3 mg/dL (ref 8.9–10.3)
Chloride: 105 mmol/L (ref 98–111)
Creatinine, Ser: 1.01 mg/dL (ref 0.61–1.24)
GFR calc Af Amer: >60 mL/min (ref >60)
GFR calc non Af Amer: >60 mL/min (ref >60)
Glucose, Bld: 178 mg/dL — ABNORMAL HIGH (ref 70–99)
Potassium: 4.8 mmol/L (ref 3.5–5.1)
Sodium: 137 mmol/L (ref 135–145)
Total Bilirubin: 0.6 mg/dL (ref 0.3–1.2)
Total Protein: 7.1 g/dL (ref 6.5–8.1)

## 2020-04-16 LAB — GLUCOSE, CAPILLARY
Glucose-Capillary: 120 mg/dL — ABNORMAL HIGH (ref 70–99)
Glucose-Capillary: 111 mg/dL — ABNORMAL HIGH (ref 70–99)
Glucose-Capillary: 95 mg/dL (ref 70–99)
Glucose-Capillary: 143 mg/dL — ABNORMAL HIGH (ref 70–99)

## 2020-04-16 LAB — CBC WITH DIFFERENTIAL/PLATELET
Abs Immature Granulocytes: 0.1 10*3/uL — ABNORMAL HIGH (ref 0.00–0.07)
Basophils Absolute: 0.1 10*3/uL (ref 0.0–0.1)
Basophils Relative: 1 %
Eosinophils Absolute: 0.5 10*3/uL (ref 0.0–0.5)
Eosinophils Relative: 3 %
HCT: 33.6 % — ABNORMAL LOW (ref 39.0–52.0)
Hemoglobin: 10.5 g/dL — ABNORMAL LOW (ref 13.0–17.0)
Immature Granulocytes: 1 %
Lymphocytes Relative: 21 %
Lymphs Abs: 3.4 10*3/uL (ref 0.7–4.0)
MCH: 28.8 pg (ref 26.0–34.0)
MCHC: 31.3 g/dL (ref 30.0–36.0)
MCV: 92.3 fL (ref 80.0–100.0)
Monocytes Absolute: 1.6 10*3/uL — ABNORMAL HIGH (ref 0.1–1.0)
Monocytes Relative: 10 %
Neutro Abs: 10.9 10*3/uL — ABNORMAL HIGH (ref 1.7–7.7)
Neutrophils Relative %: 64 %
Platelets: 424 10*3/uL — ABNORMAL HIGH (ref 150–400)
RBC: 3.64 MIL/uL — ABNORMAL LOW (ref 4.22–5.81)
RDW: 13.4 % (ref 11.5–15.5)
WBC: 16.6 10*3/uL — ABNORMAL HIGH (ref 4.0–10.5)
nRBC: 0 % (ref 0.0–0.2)

## 2020-04-16 LAB — HEMOGLOBIN A1C
Hgb A1c MFr Bld: 6.2 % — ABNORMAL HIGH (ref 4.8–5.6)
Mean Plasma Glucose: 131.24 mg/dL

## 2020-04-16 LAB — PROTIME-INR
INR: 2.5 — ABNORMAL HIGH (ref 0.8–1.2)
Prothrombin Time: 26.2 seconds — ABNORMAL HIGH (ref 11.4–15.2)

## 2020-04-16 LAB — FERRITIN: Ferritin: 56 ng/mL (ref 24–336)

## 2020-04-16 LAB — D-DIMER, QUANTITATIVE: D-Dimer, Quant: 0.96 ug/mL-FEU — ABNORMAL HIGH (ref 0.00–0.50)

## 2020-04-16 LAB — SARS CORONAVIRUS 2 BY RT PCR (HOSPITAL ORDER, PERFORMED IN ~~LOC~~ HOSPITAL LAB): SARS Coronavirus 2: POSITIVE — AB

## 2020-04-16 LAB — LACTATE DEHYDROGENASE: LDH: 182 U/L (ref 98–192)

## 2020-04-16 LAB — CBG MONITORING, ED: Glucose-Capillary: 152 mg/dL — ABNORMAL HIGH (ref 70–99)

## 2020-04-16 LAB — PREPARE RBC (CROSSMATCH)

## 2020-04-16 LAB — MRSA PCR SCREENING: MRSA by PCR: NEGATIVE

## 2020-04-16 MED ORDER — VARENICLINE TARTRATE 1 MG PO TABS
1.0000 mg | ORAL_TABLET | Freq: Every day | ORAL | Status: DC
  Administered 2020-04-16 – 2020-05-06 (×19): 1 mg via ORAL
  Filled 2020-04-16 (×22): qty 1

## 2020-04-16 MED ORDER — PIPERACILLIN-TAZOBACTAM 3.375 G IVPB
3.3750 g | Freq: Three times a day (TID) | INTRAVENOUS | Status: DC
  Administered 2020-04-16: 3.375 g via INTRAVENOUS
  Filled 2020-04-16: qty 50

## 2020-04-16 MED ORDER — PANTOPRAZOLE SODIUM 40 MG PO TBEC
40.0000 mg | DELAYED_RELEASE_TABLET | Freq: Every day | ORAL | Status: DC
  Administered 2020-04-16 – 2020-05-06 (×20): 40 mg via ORAL
  Filled 2020-04-16 (×19): qty 1

## 2020-04-16 MED ORDER — VANCOMYCIN HCL 1250 MG/250ML IV SOLN
1250.0000 mg | Freq: Two times a day (BID) | INTRAVENOUS | Status: DC
  Filled 2020-04-16: qty 250

## 2020-04-16 MED ORDER — GABAPENTIN 300 MG PO CAPS
600.0000 mg | ORAL_CAPSULE | Freq: Four times a day (QID) | ORAL | Status: DC
  Administered 2020-04-16 – 2020-04-18 (×9): 600 mg via ORAL
  Filled 2020-04-16 (×9): qty 2

## 2020-04-16 MED ORDER — VANCOMYCIN HCL IN DEXTROSE 1-5 GM/200ML-% IV SOLN: 1000.0000 mg | Freq: Two times a day (BID) | INTRAVENOUS | Status: DC

## 2020-04-16 MED ORDER — ZOLPIDEM TARTRATE 5 MG PO TABS
5.0000 mg | ORAL_TABLET | Freq: Every evening | ORAL | Status: DC | PRN
  Administered 2020-04-16 – 2020-05-05 (×14): 5 mg via ORAL
  Filled 2020-04-16 (×14): qty 1

## 2020-04-16 MED ORDER — IPRATROPIUM-ALBUTEROL 20-100 MCG/ACT IN AERS
1.0000 | INHALATION_SPRAY | Freq: Four times a day (QID) | RESPIRATORY_TRACT | Status: DC | PRN
  Administered 2020-04-22: 1 via RESPIRATORY_TRACT
  Filled 2020-04-16 (×2): qty 4

## 2020-04-16 MED ORDER — SODIUM CHLORIDE 0.9% IV SOLUTION: Freq: Once | INTRAVENOUS | Status: AC

## 2020-04-16 MED ORDER — VITAMIN B-12 1000 MCG PO TABS
1000.0000 ug | ORAL_TABLET | Freq: Every day | ORAL | Status: DC
  Administered 2020-04-16 – 2020-05-05 (×19): 1000 ug via ORAL
  Filled 2020-04-16 (×20): qty 1

## 2020-04-16 MED ORDER — FLUTICASONE PROPIONATE 50 MCG/ACT NA SUSP
1.0000 | Freq: Two times a day (BID) | NASAL | Status: DC | PRN
  Filled 2020-04-16: qty 16

## 2020-04-16 MED ORDER — MUPIROCIN CALCIUM 2 % EX CREA
TOPICAL_CREAM | Freq: Two times a day (BID) | CUTANEOUS | Status: DC
  Administered 2020-04-20 – 2020-05-02 (×5): 1 via TOPICAL
  Filled 2020-04-16 (×3): qty 15

## 2020-04-16 MED ORDER — INSULIN ASPART 100 UNIT/ML ~~LOC~~ SOLN: 0.0000 [IU] | Freq: Every day | SUBCUTANEOUS | Status: DC

## 2020-04-16 MED ORDER — ONDANSETRON HCL 4 MG/2ML IJ SOLN: 4.0000 mg | Freq: Four times a day (QID) | INTRAMUSCULAR | Status: DC | PRN

## 2020-04-16 MED ORDER — CEFAZOLIN SODIUM-DEXTROSE 2-4 GM/100ML-% IV SOLN
2.0000 g | Freq: Three times a day (TID) | INTRAVENOUS | Status: DC
  Administered 2020-04-16 – 2020-05-06 (×56): 2 g via INTRAVENOUS
  Filled 2020-04-16 (×63): qty 100

## 2020-04-16 MED ORDER — THIAMINE HCL 100 MG PO TABS
100.0000 mg | ORAL_TABLET | Freq: Every day | ORAL | Status: DC
  Administered 2020-04-16 – 2020-05-06 (×20): 100 mg via ORAL
  Filled 2020-04-16 (×20): qty 1

## 2020-04-16 MED ORDER — PIPERACILLIN-TAZOBACTAM 3.375 G IVPB: 3.3750 g | Freq: Four times a day (QID) | INTRAVENOUS | Status: DC

## 2020-04-16 MED ORDER — ZINC SULFATE 220 (50 ZN) MG PO CAPS
220.0000 mg | ORAL_CAPSULE | Freq: Every day | ORAL | Status: DC
  Administered 2020-04-16 – 2020-04-19 (×4): 220 mg via ORAL
  Filled 2020-04-16 (×4): qty 1

## 2020-04-16 MED ORDER — PHENYLEPHRINE 40 MCG/ML (10ML) SYRINGE FOR IV PUSH (FOR BLOOD PRESSURE SUPPORT): 80.0000 ug | PREFILLED_SYRINGE | Freq: Once | INTRAVENOUS | Status: DC | PRN

## 2020-04-16 MED ORDER — PHENYLEPHRINE HCL-NACL 10-0.9 MG/250ML-% IV SOLN
0.0000 ug/min | INTRAVENOUS | Status: DC
  Administered 2020-04-16: 20 ug/min via INTRAVENOUS
  Filled 2020-04-16: qty 250

## 2020-04-16 MED ORDER — TRANEXAMIC ACID FOR EPISTAXIS
500.0000 mg | Freq: Once | TOPICAL | Status: AC
  Administered 2020-04-16: 500 mg via TOPICAL
  Filled 2020-04-16: qty 10

## 2020-04-16 MED ORDER — LIDOCAINE-EPINEPHRINE (PF) 2 %-1:200000 IJ SOLN
INTRAMUSCULAR | Status: AC
  Administered 2020-04-16: 20 mL
  Filled 2020-04-16: qty 20

## 2020-04-16 MED ORDER — INSULIN ASPART 100 UNIT/ML ~~LOC~~ SOLN
0.0000 [IU] | Freq: Three times a day (TID) | SUBCUTANEOUS | Status: DC
  Administered 2020-04-17: 2 [IU] via SUBCUTANEOUS
  Administered 2020-04-19: 3 [IU] via SUBCUTANEOUS
  Administered 2020-04-19 – 2020-04-21 (×4): 2 [IU] via SUBCUTANEOUS
  Administered 2020-04-22 – 2020-04-24 (×3): 3 [IU] via SUBCUTANEOUS
  Administered 2020-04-25 (×2): 2 [IU] via SUBCUTANEOUS
  Administered 2020-04-26: 3 [IU] via SUBCUTANEOUS

## 2020-04-16 MED ORDER — ZINC GLUCONATE 100 MG PO TABS: 100.0000 mg | ORAL_TABLET | Freq: Every morning | ORAL | Status: DC

## 2020-04-16 MED ORDER — ACETAMINOPHEN 325 MG PO TABS
650.0000 mg | ORAL_TABLET | ORAL | Status: DC | PRN
  Administered 2020-04-17 – 2020-05-04 (×5): 650 mg via ORAL
  Filled 2020-04-16 (×6): qty 2

## 2020-04-16 MED ORDER — VANCOMYCIN HCL 1750 MG/350ML IV SOLN
1750.0000 mg | Freq: Once | INTRAVENOUS | Status: AC
  Administered 2020-04-16: 1750 mg via INTRAVENOUS
  Filled 2020-04-16: qty 350

## 2020-04-16 NOTE — H&P (Addendum)
Advanced Heart Failure Team History and Physical Note   PCP:  [name], MD  PCP-Cardiology: No primary care provider on file.     Reason for Admission: Bleeding from power cord tunnel infection site   HPI:    59 y.o. with history of nonischemic cardiomyopathy, RLE DVT, cirrhosis, smoking, and OSA returns for followup of CHF/LVAD placement.  Cardiomyopathy was diagnosed in 6/19 in [place], [place] at that time showed low output.  He was admitted to [facility] in 9/19 with low output HF and was started on milrinone and diuresed.  Unable to wean off milrinone.  He had a degree of RV failure, but this improved on milrinone.  Valvular heart disease also looked better with milrinone and diuresis.  On 08/03/18, he had Heartmate 3 LVAD placed.  Speed was optimized by ramp echo post-op.  Post-op course was relatively unremarkable.  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  No abscess on CT.  He was started on cefazolin IV for 6 wks.  BP-active meds decreased with low MAP.   He was admitted in 3/21 with subxiphoid abscess and cellulitis.  CT showed collection along the course of the driveline.  There was no evidence for sternal osteomyelitis.  Abscess was debrided, MSSA grew from wound cultures.  Wound vac was placed.  Patient was started on cefazolin, which was recently stopped after completing 8 wks.   Patient was re-admitted later in 3/21 with facial Zoster.  He was treated with acyclovir and this has resolved.   Recently, the lower sternal wound site had reopened with serosanguineous drainage culturing MSSA.  He had been on cephalexin and doxycycline.  Earlier this morning, he was admitted with uncontrolled bleeding from lower sternal wound that started overnight.  He was initially hypotensive with MAP 62.  Afebrile, WBCs 16.6, hgb 10.5 with INR 2.5.  He had a suture placed by the ER MD in the wound with  control of bleeding.  He got 1 unit FFP and will receive 1 unit PRBCs with active bleeding/hypotension. He is afebrile, MAP now 86 on phenylephrine 20.   Of note, he has also testing positive for COVID-19 though asymptomatic with no cough or fever. CXR with no active disease.   Denies LVAD alarms.  Reports taking Coumadin as prescribed and adherence to anticoagulation based dietary restrictions.  Denies bright red blood per rectum or melena, no dark urine or hematuria.   VAD interrogation : Heartmate 3 Speed: 5700 Flow:  4.4 Power:  4.5w PI: 8  Review of Systems: All systems reviewed and negative except as per HPI.   Home Medications Prior to Admission medications   Medication Sig Start Date End Date Taking? Authorizing Provider  amLODipine (NORVASC) 5 MG tablet Take 2 tablets (10 mg total) by mouth daily. 09/29/19 03/28/20  Laurey Morale, MD  aspirin EC 81 MG tablet Take 81 mg by mouth daily.    [provider]  carbamide peroxide (DEBROX) 6.5 % OTIC solution Place 5 drops into both ears 3 (three) times daily. Patient taking differently: Place 5 drops into both ears 2 (two) times daily.  12/10/18   Clegg, Amy D, NP  cephALEXin (KEFLEX) 500 MG capsule Take 1 capsule (500 mg total) by mouth 3 (three) times daily. Patient taking differently: Take 500 mg by mouth 4 (four) times daily.  03/23/20   Blanchard Kelch, NP  cyanocobalamin 500 MCG tablet Take  1,000 mcg by mouth daily.     [provider]  docusate sodium (COLACE) 100 MG capsule Take 2 capsules (200 mg total) by mouth daily as needed for mild constipation. Patient not taking: Reported on 03/28/2020 08/12/18   Darrick Grinder D, NP  doxycycline (VIBRAMYCIN) 100 MG capsule Take 1 capsule (100 mg total) by mouth 2 (two) times daily for 14 days. 04/14/20 04/28/20  Ivin Poot, MD  fluticasone (FLONASE) 50 MCG/ACT nasal spray Place 1 spray into both nostrils 2 (two) times daily as needed for allergies.     [provider]  gabapentin (NEURONTIN) 300 MG capsule Take 2 capsules (600 mg total) by mouth 4 (four) times daily. 03/28/20 04/27/20  Larey Dresser, MD  glucose blood (CONTOUR NEXT TEST) test strip Use as instructed 12/18/18   Larey Dresser, MD  hydrALAZINE (APRESOLINE) 50 MG tablet Take 1 tablet (50 mg total) by mouth 3 (three) times daily. 02/09/20   Lyda Jester M, PA-C  Ipratropium-Albuterol (COMBIVENT) 20-100 MCG/ACT AERS respimat Inhale 1 puff into the lungs every 6 (six) hours as needed for wheezing or shortness of breath.     [provider]  Lancets (FREESTYLE) lancets Use as instructed 12/18/18   Larey Dresser, MD  nystatin (MYCOSTATIN/NYSTOP) powder Apply 1 application topically 3 (three) times daily. 02/15/20   Larey Dresser, MD  pantoprazole (PROTONIX) 40 MG tablet Take 1 tablet (40 mg total) by mouth daily. 02/09/20   Lyda Jester M, PA-C  sacubitril-valsartan (ENTRESTO) 97-103 MG Take 1 tablet by mouth 2 (two) times daily. 09/15/19   Larey Dresser, MD  sildenafil (VIAGRA) 100 MG tablet Take 50 mg by mouth daily as needed for erectile dysfunction.    [provider]  spironolactone (ALDACTONE) 25 MG tablet Take 1 tablet (25 mg total) by mouth daily. 02/09/20   Lyda Jester M, PA-C  thiamine 100 MG tablet Take 1 tablet (100 mg total) by mouth daily. 04/05/19   [place]iana Shore, NP  traMADol (ULTRAM) 50 MG tablet Take 2 tablets (100 mg total) by mouth every 6 (six) hours as needed for severe pain. Patient not taking: Reported on 03/28/2020 02/24/20   Lyda Jester M, PA-C  varenicline (CHANTIX CONTINUING MONTH PAK) 1 MG tablet Take 1 mg by mouth daily.     [provider]  warfarin (COUMADIN) 5 MG tablet Take 7.5 mg (1.5 tab) MWF and 5 mg all other days or as directed by HF Clinic. 02/16/20   Bensimhon, Shaune Pascal, MD  Zinc Gluconate 100 MG TABS Take 1 tablet (100 mg total) by mouth daily. Patient taking differently: Take 100 mg by mouth  2 (two) times a day.  12/15/18   Ivin Poot, MD  zolpidem (AMBIEN) 5 MG tablet TAKE 1 TABLET BY MOUTH EVERY DAY AT BEDTIME 02/15/20   Larey Dresser, MD    Past Medical History: Past Medical History:  Diagnosis Date  . 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 Cardiomyopathy, unspecified ([place])   . CHF (congestive heart failure) ([place])   . Chronic back pain   . Diabetes mellitus without complication ([place])   . Enlarged heart   . Gastric ulcer   . Gastroenteritis   . H/O degenerative disc disease   . Hypertension   . LVAD (left ventricular assist device) present [facility])     Past Surgical History: Past Surgical History:  Procedure Laterality Date  . APPLICATION OF WOUND VAC N/A 12/03/2018   Procedure:  APPLICATION OF WOUND VAC;  Surgeon: Donata Clay, Theron Arista, MD;  Location: [facility] OR;  Service: Thoracic;  Laterality: N/A;  . APPLICATION OF WOUND VAC N/A 12/09/2018   Procedure: Removal of Wound Vac, Application of ACELL;  Surgeon: Kerin Perna, MD;  Location: [facility] OR;  Service: Thoracic;  Laterality: N/A;  . APPLICATION OF WOUND VAC N/A 01/26/2020   Procedure: APPLICATION OF WOUND VAC;  Surgeon: Kerin Perna, MD;  Location[facility] OR;  Service: Thoracic;  Laterality: N/A;  . APPLICATION OF WOUND VAC N/A 01/31/2020   Procedure: WOUND VAC CHANGE;  Surgeon: Kerin Perna, MD;  Location: [facility] OR;  Service: Thoracic;  Laterality: N/A;  . INSERTION OF IMPLANTABLE LEFT VENTRICULAR ASSIST DEVICE N/A 08/03/2018   Procedure: INSERTION OF IMPLANTABLE LEFT VENTRICULAR ASSIST DEVICE - HM3;  Surgeon: Alleen Borne, MD;  Location: [facility] OR;  Service: Open Heart Surgery;  Laterality: N/A;  HM3  . LUMBAR LAMINECTOMY/DECOMPRESSION MICRODISCECTOMY    . ORCHIECTOMY    . RIGHT HEART CATH N/A 07/24/2018   Procedure: RIGHT HEART CATH;  Surgeon: Laurey Morale, MD;  Location: [facility] INVASIVE CV LAB;  Service: Cardiovascular;  Laterality: N/A;  . STERNAL WOUND DEBRIDEMENT N/A 12/03/2018   Procedure: WOUND DEBRIDEMENT WITH  A-CELL;  Surgeon: Kerin Perna, MD;  Location: [facility] OR;  Service: Thoracic;  Laterality: N/A;  . STERNAL WOUND DEBRIDEMENT N/A 01/26/2020   Procedure: STERNAL WOUND & driveline IRRIGATION AND DEBRIDEMENT;  Surgeon: Kerin Perna, MD;  Location: [facility] OR;  Service: Thoracic;  Laterality: N/A;  . STERNAL WOUND DEBRIDEMENT N/A 01/31/2020   Procedure: STERNAL WOUND IRRIGATION;  Surgeon: Kerin Perna, MD;  Location: [facility] OR;  Service: Thoracic;  Laterality: N/A;  MESONIC JET  . STERNAL WOUND DEBRIDEMENT N/A 02/04/2020   Procedure: STERNAL WOUND DEBRIDEMENT WITH WOUND VAC CHANGE;  Surgeon: Kerin Perna, MD;  Location: [facility] OR;  Service: Thoracic;  Laterality: N/A;  . TEE WITHOUT CARDIOVERSION N/A 08/03/2018   Procedure: TRANSESOPHAGEAL ECHOCARDIOGRAM (TEE);  Surgeon: Alleen Borne, MD;  Location: [facility] OR;  Service: Open Heart Surgery;  Laterality: N/A;    Family History:  No history of cardiomyopathy.   Social History: Social History   Socioeconomic History  . Marital status: Single    Spouse name: Not on file  . Number of children: 7  . Years of education: 9th  . Highest education level: 9th grade  Occupational History  . Occupation: disabled  Tobacco Use  . Smoking status: Current Every Day Smoker    Types: Cigarettes    Start date: 2003  . Smokeless tobacco: Never Used  . Tobacco comment: Nicoderm patch   Substance and Sexual Activity  . Alcohol use: Not Currently  . Drug use: Not Currently  . Sexual activity: Not on file  Other Topics Concern  . Not on file  Social History Narrative  . Not on file   Social Determinants of Health   Financial Resource Strain:   . Difficulty of Paying Living Expenses:   Food Insecurity:   . Worried About Programme researcher, broadcasting/film/video in the Last Year:   . Barista in the Last Year:   Transportation Needs:   . Freight forwarder (Medical):   Marland Kitchen Lack of Transportation (Non-Medical):   Physical Activity:   . Days of Exercise per Week:     . Minutes of Exercise per Session:   Stress:   . Feeling of Stress :   Social Connections:   .  Frequency of Communication with Friends and Family:   . Frequency of Social Gatherings with Friends and Family:   . Attends Religious Services:   . Active Member of Clubs or Organizations:   . Attends Banker Meetings:   Marland Kitchen Marital Status:     Allergies:  Allergies  Allergen Reactions  . Chlorhexidine Rash  . Other Rash    Prep pads    Objective:    Vital Signs:   Temp:  [98.3 F (36.8 C)-98.6 F (37 C)] 98.6 F (37 C) (05/30 0622) Pulse Rate:  [84-90] 87 (05/30 0622) Resp:  [16-22] 19 (05/30 0622) BP: (66-103)/(0-90) 88/64 (05/30 0622) SpO2:  [80 %-100 %] 100 % (05/30 0620) Weight:  [89.4 kg] 89.4 kg (05/30 0416)   Filed Weights   04/16/20 0416  Weight: 89.4 kg     Physical Exam     General: Well appearing this am. NAD.  HEENT: Normal. Neck: Supple, JVP 7-8 cm. Carotids OK.  Cardiac:  Mechanical heart sounds with LVAD hum present.  Lungs:  CTAB, normal effort.  Abdomen:  NT, ND, no HSM. No bruits or masses. +BS  LVAD exit site: Sternal wound site now dressed without active bleeding Extremities:  Warm and dry. No cyanosis, clubbing, rash, or edema.  Neuro:  Alert & oriented x 3. Cranial nerves grossly intact. Moves all 4 extremities w/o difficulty. Affect pleasant     Telemetry   NSR 80s (personally reviewed)  EKG   NSR 89, inferior Qs (personally reviewed)  Labs     Basic Metabolic Panel: Recent Labs  Lab 04/16/20 0408  NA 137  K 4.8  CL 105  CO2 21*  GLUCOSE 178*  BUN 12  CREATININE 1.01  CALCIUM 9.3    Liver Function Tests: Recent Labs  Lab 04/16/20 0408  AST 19  ALT 14  ALKPHOS 105  BILITOT 0.6  PROT 7.1  ALBUMIN 3.4*   No results for input(s): LIPASE, AMYLASE in the last 168 hours. No results for input(s): AMMONIA in the last 168 hours.  CBC: Recent Labs  Lab 04/10/20 0902 04/16/20 0408  WBC 9.5 16.6*   NEUTROABS  --  10.9*  HGB 11.5* 10.5*  HCT 36.6* 33.6*  MCV 92.2 92.3  PLT 318 424*    Cardiac Enzymes: No results for input(s): CKTOTAL, CKMB, CKMBINDEX, TROPONINI in the last 168 hours.  BNP: BNP (last 3 results) No results for input(s): BNP in the last 8760 hours.  ProBNP (last 3 results) No results for input(s): PROBNP in the last 8760 hours.   CBG: Recent Labs  Lab 04/16/20 0359  GLUCAP 152*    Coagulation Studies: Recent Labs    04/16/20 0408  LABPROT 26.2*  INR 2.5*    Imaging: DG Chest Portable 1 View  Result Date: 04/16/2020 CLINICAL DATA:  Dyspnea EXAM: PORTABLE CHEST 1 VIEW COMPARISON:  02/01/2020 chest radiograph. FINDINGS: Intact sternotomy wires. Partially visualized LVAD overlying the left ventricular apex. Stable cardiomediastinal silhouette with mild cardiomegaly. No pneumothorax. No pleural effusion. No overt pulmonary edema. No acute consolidative airspace disease. IMPRESSION: Stable mild cardiomegaly without overt pulmonary edema. No active pulmonary disease. Electronically Signed   By: Delbert Phenix M.D.   On: 04/16/2020 04:54      Assessment/Plan   1. 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  He previously required debridement of lower sternal wound with wound VAC therapy and IV antibiotics.  This was completed with 3 surgical procedures including  debridement and ACell application in March 2021.  More recently, the lower sternal wound reopened with drainage again culturing MSSA and he had been on cephalexin + doxycycline.  Today, he developed persistent bleeding from the wound site and came to ER, bleeding now stopped with suture.  Hgb 10.7, INR 2.5.  He is getting 1 unit PRBCs and 1 unit FFP.  - Will cover with vancomycin/Zosyn => discussed with Rexene Alberts with ID who recommended addition of cefazolin.  Will ask ID to formally consult.  - CT chest/abd/pelvis today.  - Return to OR when  INR lower (probably Tuesday, seen by Dr. Donata Clay).  2. COVID-19 infection: Asymptomatic with no fever, no cough/dyspnea.  WBCS 16.6.   - Will send ferritin, D dimer.  - Discuss monoclonal antibody infusion with Dr. Kendrick Fries.  3. 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  LVAD parameters stable.  Has been NYHA class II.   He does not look volume overloaded.  MAP low initially, started on phenylephrine (may have been at least partly vagal as very diaphoretic with bleeding).  MAP now 86 on phenylephrine 20.  Hopefully MAP will stabilize with IV abx and PRBCs/FFP.  - Will attempt to wean off phenylephrine now.   - He does not need Lasix.      - For now, will hold spironolactone, Entresto, amlodipine.  Restart if BP trends up significantly.   - Hold ASA and warfarin with bleeding.   4. Smoking: He has cut back a lot, still taking Chantix. Encouraged to quit.  5. H/o RLE DVT: On warfarin for LVAD.  6. OSA: Continue CPAP.  7. Hyperlipidemia: Atorvastatin.  8. Type II diabetes: SSI     Marca Ancona, MD 04/16/2020, 6:42 AM  Advanced Heart Failure Team Pager [phone] (M-F; 7a - 4p)  Please contact [facility] for night-coverage after hours (4p -7a ) and weekends on [email or web address]

## 2020-04-16 NOTE — Consult Note (Signed)
Date of Admission:  04/16/2020          Reason for Consult: hx of MSSAB, sternal osteomyelitis, driveilne infection and + COVID test   Referring Provider: Dr. Shirlee Latch   Assessment:  1. Sternal wound bleed causing hypotension 2. Hx of recurrent  MSSA bacteremia in context of   3. 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 Sternal action with concern for osteomyelitis status post I&D and 8 weeks of IV cefazolin followed by Keflex sp I and D  5. History of disseminated zoster infection 6. COVID-19 positive PCR with cycle threshold number of 37.9  Plan:  1. Narrow to cefazolin 2. Follow-up blood cultures 3. If he has further exploration of his wound with debridement please send for cultures 4. He needs to remain on airborne and contact precautions with his COVID-19 positive test   Active Problems:   Infection associated with driveline of left ventricular assist device (LVAD) (HCC)   Scheduled Meds: . gabapentin  600 mg Oral QID  . insulin aspart  0-15 Units Subcutaneous TID WC  . insulin aspart  0-5 Units Subcutaneous QHS  . mupirocin cream   Topical BID  . pantoprazole  40 mg Oral Daily  . thiamine  100 mg Oral Daily  . varenicline  1 mg Oral Daily  . vitamin B-12  1,000 mcg Oral Daily  . zinc sulfate  220 mg Oral Daily   Continuous Infusions: . phenylephrine (NEO-SYNEPHRINE) Adult infusion 2.5 mcg/min (04/16/20 1100)  . piperacillin-tazobactam (ZOSYN)  IV Stopped (04/16/20 1012)  . vancomycin     PRN Meds:.acetaminophen, fluticasone, Ipratropium-Albuterol, ondansetron (ZOFRAN) IV, zolpidem  HPI: Theodore Welch is a 59 y.o. male  male with history of cirrhosis, HM3 LVAD placed 07-2018.  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    January-2020: MSSA infection of counter incision to mid abdomen; s/p serial debridements. Initially did not involve exit site at this time. Received IV Cefazolin  x 4 weeks --> PO Cephalexin x 4 weeks.   May-2020: recurrent MSSA infection, now involving driveline exit site and secondary bacteremia. TEE deferred due to low sensitivity for detecting VAD endocarditis. 6 weeks IV Cefazolin s/p abd wall debridement --> cephalexin for chronic suppression for duration pump is in place.   March-2021 sudden onset subxiphoid abscess involving proximal driveline, MSSA on cultures. Blood cultures negative. S/P I&D of abdominal wall exit site and subxiphoid space. IV cefazolin x 8 weeks, and seen by Rexene Alberts in early May.  His PICC line was discontinued he was placed on Keflex 500 mg 4 times daily.  He is not supposed to be on doxycycline this was provided as an alternative to Keflex but he disliked the GI side effect profile of doxycycline.  Recently, the lower sternal wound site had reopened with serosanguineous drainage culturing MSSA. Earlier this morning, he was admitted with uncontrolled bleeding from lower sternal wound that started overnight.  He was initially hypotensive with MAP 62.  In the ER controlled the bleeding was accomplished by the ER physician.  Patient received a unit of FFP 1 unit of packed red blood cells.  He has been afebrile blood cultures were taken.  He has now undergone CT of the chest abdomen pelvis I have reviewed personally and I agree with the findings.    The CT of the chest showed that there is still a small superficial fluid density in the midline chest  wall anterior to the xiphoid process that is increased since CT scan done in January 25, 2020.  There is also some stable soft tissue tracking along the proximal driveline in the subcutaneous ventral left abdominal wall.  There were no new fluid collections present.  Dr. Donata Clay is seen the patient and is considering wound exploration.  Patient has tested + for COVID 19 with cycle threshold of 37.9.  He and his wife had completed Covid vaccinations with Pfizer vaccine in late  April.  He is unaware of ever having had Covid and is unaware of any sick contacts.  Most recent Covid testing that he underwent was in March which was negative  Patient has been placed on vancomycin and Zosyn.  I was called this morning by Dr. Shirlee Latch and also texted by Rexene Alberts about the patient. Judeth Cornfield was concerned about vanomycin "creep" on his Staph aureus isolated with MIC being 2 now.  I think we can narrow him to cefazolin alone.  His cycle threshold for COVID-19 not shown to be terribly infectious but is not as high as I would like it to be.  He is clearly asymptomatic as far as possible COVID-19 infection.  It is not possible for me to tell if this positive test represents persistent positive PCR versus early infection on the way to becoming more infectious.  He needs to remain in airborne and contact precautions in the interim.        Review of Systems: Review of Systems  Constitutional: Negative for chills, diaphoresis, fever, malaise/fatigue and weight loss.  HENT: Negative for congestion, hearing loss, sore throat and tinnitus.   Eyes: Negative for blurred vision and double vision.  Respiratory: Negative for cough, sputum production, shortness of breath and wheezing.   Cardiovascular: Negative for chest pain, palpitations and leg swelling.  Gastrointestinal: Positive for abdominal pain. Negative for blood in stool, constipation, diarrhea, heartburn, melena, nausea and vomiting.  Genitourinary: Negative for dysuria, flank pain and hematuria.  Musculoskeletal: Negative for back pain, falls, joint pain and myalgias.  Skin: Negative for itching and rash.  Neurological: Positive for dizziness and weakness. Negative for sensory change, focal weakness, loss of consciousness and headaches.  Endo/Heme/Allergies: Does not bruise/bleed easily.  Psychiatric/Behavioral: Negative for depression, memory loss and suicidal ideas. The patient is not nervous/anxious.     Past  Medical History:  Diagnosis Date  . 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 Cardiomyopathy, unspecified (HCC)   . CHF (congestive heart failure) (HCC)   . Chronic back pain   . Diabetes mellitus without complication (HCC)   . Enlarged heart   . Gastric ulcer   . Gastroenteritis   . H/O degenerative disc disease   . Hypertension   . LVAD (left ventricular assist device) present [facility])     Social History   Tobacco Use  . Smoking status: Current Every Day Smoker    Types: Cigarettes    Start date: 2003  . Smokeless tobacco: Never Used  . Tobacco comment: Nicoderm patch   Substance Use Topics  . Alcohol use: Not Currently  . Drug use: Not Currently    No family history on file. Allergies  Allergen Reactions  . Chlorhexidine Rash  . Other Rash    Prep pads    OBJECTIVE: Blood pressure (!) 86/75, pulse 87, temperature 99.3 F (37.4 C), temperature source Oral, resp. rate 16, height 5\' 11"  (1.803 m), weight 89.4 kg, SpO2 99 %.  Physical Exam Constitutional:  Appearance: He is well-developed.  HENT:     Head: Normocephalic and atraumatic.  Eyes:     Conjunctiva/sclera: Conjunctivae normal.  Cardiovascular:     Rate and Rhythm: Normal rate and regular rhythm.  Pulmonary:     Effort: Pulmonary effort is normal. No respiratory distress.     Breath sounds: No wheezing.  Abdominal:     General: There is no distension.     Palpations: Abdomen is soft.  Musculoskeletal:        General: No tenderness. Normal range of motion.     Cervical back: Normal range of motion and neck supple.  Skin:    General: Skin is warm and dry.     Coloration: Skin is not pale.     Findings: No erythema or rash.  Neurological:     General: No focal deficit present.     Mental Status: He is alert and oriented to person, place, and time.  Psychiatric:        Mood and Affect: Mood normal.        Behavior: Behavior normal.        Thought Content: Thought content normal.        Judgment:  Judgment normal.    Bandages in place  Lab Results Lab Results  Component Value Date   WBC 16.6 (H) 04/16/2020   HGB 10.5 (L) 04/16/2020   HCT 33.6 (L) 04/16/2020   MCV 92.3 04/16/2020   PLT 424 (H) 04/16/2020    Lab Results  Component Value Date   CREATININE 1.01 04/16/2020   BUN 12 04/16/2020   NA 137 04/16/2020   K 4.8 04/16/2020   CL 105 04/16/2020   CO2 21 (L) 04/16/2020    Lab Results  Component Value Date   ALT 14 04/16/2020   AST 19 04/16/2020   ALKPHOS 105 04/16/2020   BILITOT 0.6 04/16/2020     Microbiology: Recent Results (from the past 240 hour(s))  Aerobic Culture (superficial specimen)     Status: None   Collection Time: 04/12/20  9:30 AM   Specimen: Wound  Result Value Ref Range Status   Specimen Description WOUND STERNUM  Final   Special Requests LVAD INFECTION  Final   Gram Stain   Final    RARE WBC PRESENT,BOTH PMN AND MONONUCLEAR NO ORGANISMS SEEN Performed at [facility], [address], [place], [place] [address]    Culture FEW STAPHYLOCOCCUS AUREUS  Final   Report Status 04/14/2020 FINAL  Final   Organism ID, Bacteria STAPHYLOCOCCUS AUREUS  Final      Susceptibility   Staphylococcus aureus - MIC*    CIPROFLOXACIN <=0.5 SENSITIVE Sensitive     ERYTHROMYCIN >=8 RESISTANT Resistant     GENTAMICIN <=0.5 SENSITIVE Sensitive     OXACILLIN 1 SENSITIVE Sensitive     TETRACYCLINE 2 SENSITIVE Sensitive     VANCOMYCIN 2 SENSITIVE Sensitive     TRIMETH/SULFA <=10 SENSITIVE Sensitive     CLINDAMYCIN RESISTANT Resistant     RIFAMPIN <=0.5 SENSITIVE Sensitive     Inducible Clindamycin POSITIVE Resistant     * FEW STAPHYLOCOCCUS AUREUS  SARS Coronavirus 2 by RT PCR (hospital order, performed in [facility] hospital lab) Nasopharyngeal Nasopharyngeal Swab     Status: Abnormal   Collection Time: 04/16/20  4:21 AM   Specimen: Nasopharyngeal Swab  Result Value Ref Range Status   SARS Coronavirus 2 POSITIVE (A) NEGATIVE Final    Comment:  RESULT CALLED TO, READ BACK BY AND  VERIFIED WITH: RN Marye Round OLDLAND AT 2751 BY MESSAN HOUEGNIFIO ON 04/16/2020 (NOTE) SARS-CoV-2 target nucleic acids are DETECTED SARS-CoV-2 RNA is generally detectable in upper respiratory specimens  during the acute phase of infection.  Positive results are indicative  of the presence of the identified virus, but do not rule out bacterial infection or co-infection with other pathogens not detected by the test.  Clinical correlation with patient history and  other diagnostic information is necessary to determine patient infection status.  The expected result is negative. Fact Sheet for Patients:   StrictlyIdeas.no  Fact Sheet for Healthcare Providers:   BankingDealers.co.za   This test is not yet approved or cleared by the [place] FDA and  has been authorized for detection and/or diagnosis of SARS-CoV-2 by FDA under an Emergency Use Authorization (EUA).  This EUA will remain in effe ct (meaning this test can be used) for the duration of  the COVID-19 declaration under Section 564(b)(1) of the Act, 21 U.S.C. section 360-bbb-3(b)(1), unless the authorization is terminated or revoked sooner. Performed at [facility], [place] [address], [place], [place] [address]   Blood culture (routine x 2)     Status: None (Preliminary result)   Collection Time: 04/16/20  5:20 AM   Specimen: BLOOD RIGHT HAND  Result Value Ref Range Status   Specimen Description BLOOD RIGHT HAND  Final   Special Requests   Final    BOTTLES DRAWN AEROBIC AND ANAEROBIC Blood Culture adequate volume   Culture   Final    NO GROWTH <12 HOURS Performed at [facility], [place] [address], [place], [place] [address]    Report Status PENDING  Incomplete  MRSA PCR Screening     Status: None   Collection Time: 04/16/20  8:27 AM   Specimen: Nasal Mucosa; Nasopharyngeal  Result Value Ref Range Status   MRSA by PCR NEGATIVE NEGATIVE  Final    Comment:        The GeneXpert MRSA Assay (FDA approved for NASAL specimens only), is one component of a comprehensive MRSA colonization surveillance program. It is not intended to diagnose MRSA infection nor to guide or monitor treatment for MRSA infections. Performed at [facility], [place] [address], [place], [place] [address]     Alcide Evener, [place] [facility] [phone] pager  04/16/2020, 12:16 PM

## 2020-04-16 NOTE — ED Triage Notes (Addendum)
Pt arrived from home c/o bleeding from chest r/t abscess that was drained wendsday associated with LVAD. Pt having uncontrolled bleeding from site. MD at bedside.

## 2020-04-16 NOTE — Progress Notes (Signed)
Pharmacy Antibiotic Note  Theodore Welch is a 59 y.o. male admitted on 04/16/2020 LVAD sternal wound infection. Pt has hx of recurrent MSSA infection and is now s/p 8 weeks cefazolin followed by cephalexin. Pharmacy initially consulted for broad spectrum antibiotics, ID now involved and recommends resuming cefazolin.   Plan: -Stop vancomycin and pip/tazo -Begin cefazolin 2g IV q8h   Height: 5\' 11"  (180.3 cm) Weight: 89.4 kg (197 lb) IBW/kg (Calculated) : 75.3  Temp (24hrs), Avg:98.9 F (37.2 C), Min:98.3 F (36.8 C), Max:99.3 F (37.4 C)  Recent Labs  Lab 04/10/20 0902 04/16/20 0408  WBC 9.5 16.6*  CREATININE  --  1.01    Estimated Creatinine Clearance: 81.8 mL/min (by C-G formula based on SCr of 1.01 mg/dL).    Allergies  Allergen Reactions  . Chlorhexidine Rash  . Other Rash    Prep pads    Thank you for allowing pharmacy to be a part of this patient's care.  04/18/20, PharmD, BCPS Clinical Pharmacist [phone] Please check AMION for all [facility] Pharmacy numbers 04/16/2020

## 2020-04-16 NOTE — ED Provider Notes (Signed)
[facility] EMERGENCY DEPARTMENT Provider Note   CSN: [id number] Arrival date & time: 04/16/20  0355     History No chief complaint on file.   Theodore Welch is a 59 y.o. male.  Level 5 caveat for acuity of condition.  Patient has an LVAD and is here with bleeding wound from his sternum.  He is diaphoretic and short of breath.  He apparently has had a chronic wound which is being followed by the LVAD clinic and has required debridement several times in the past.  It started bleeding today woke him from sleep and EMS reports soaking through multiple dressings.  Patient does take Coumadin.  He denies any chest pain.  He denies abdominal pain.  He has had some diarrhea recently for being on doxycycline.  No fever.  No chills nausea or vomiting.  He feels his shortness of breath is at baseline.  The history is provided by the patient and the EMS personnel. The history is limited by the condition of the patient.       Past Medical History:  Diagnosis Date  . 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 Cardiomyopathy, unspecified (HCC)   . CHF (congestive heart failure) (HCC)   . Chronic back pain   . Diabetes mellitus without complication (HCC)   . Enlarged heart   . Gastric ulcer   . Gastroenteritis   . H/O degenerative disc disease   . Hypertension   . LVAD (left ventricular assist device) present [facility])     Patient Active Problem List   Diagnosis Date Noted  . Disseminated zoster 02/12/2020  . Abscess of sternal region 01/27/2020  . Staph aureus infection 01/27/2020  . Vaccine counseling 08/12/2019  . 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 Chronic systolic (congestive) heart failure (HCC) 11/28/2018  . Complication involving left ventricular assist device (LVAD) 11/23/2018  . LVAD (left ventricular assist device) present (HCC) 08/03/2018  . DVT (deep venous thrombosis) (HCC) 08/02/2018  . Advance care  planning   . Ascites   . COPD (chronic obstructive pulmonary disease) (HCC) 07/21/2018  . HTN (hypertension) 07/21/2018  . Tobacco use 07/21/2018  . Congestive heart failure (HCC) 07/21/2018  . CHF (congestive heart failure) (HCC) 07/21/2018  . Type 2 diabetes mellitus (HCC) 07/21/2018  . Hyperlipidemia 07/21/2018  . Fibromyalgia syndrome 01/27/2017  . Myofascial pain syndrome, cervical 01/27/2017  . Lumbar radiculopathy 01/24/2014  . History of lumbar laminectomy for spinal cord decompression 12/07/2013  . 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    Past Surgical History:  Procedure Laterality Date  . APPLICATION OF WOUND VAC N/A 12/03/2018   Procedure: APPLICATION OF WOUND VAC;  Surgeon: Donata Clay, Theron Arista, MD;  Location: [facility] OR;  Service: Thoracic;  Laterality: N/A;  . APPLICATION OF WOUND VAC N/A 12/09/2018   Procedure: Removal of Wound Vac, Application of ACELL;  Surgeon: Kerin Perna, MD;  Location: [facility] OR;  Service: Thoracic;  Laterality: N/A;  . APPLICATION OF WOUND VAC N/A 01/26/2020   Procedure: APPLICATION OF WOUND VAC;  Surgeon: Kerin Perna, MD;  Location: [facility] OR;  Service: Thoracic;  Laterality: N/A;  . APPLICATION OF WOUND VAC N/A 01/31/2020   Procedure: WOUND VAC CHANGE;  Surgeon: Kerin Perna, MD;  Location: [facility] OR;  Service: Thoracic;  Laterality: N/A;  . INSERTION OF IMPLANTABLE LEFT VENTRICULAR ASSIST DEVICE N/A 08/03/2018   Procedure: INSERTION OF IMPLANTABLE LEFT VENTRICULAR ASSIST DEVICE -  HM3;  Surgeon: Alleen Borne, MD;  Location: [facility] OR;  Service: Open Heart Surgery;  Laterality: N/A;  HM3  . LUMBAR LAMINECTOMY/DECOMPRESSION MICRODISCECTOMY    . ORCHIECTOMY    . RIGHT HEART CATH N/A 07/24/2018   Procedure: RIGHT HEART CATH;  Surgeon: Laurey Morale, MD;  Location: [facility] INVASIVE CV LAB;  Service: Cardiovascular;  Laterality: N/A;  . STERNAL WOUND DEBRIDEMENT N/A 12/03/2018   Procedure: WOUND DEBRIDEMENT WITH A-CELL;  Surgeon:  Kerin Perna, MD;  Location: [facility] OR;  Service: Thoracic;  Laterality: N/A;  . STERNAL WOUND DEBRIDEMENT N/A 01/26/2020   Procedure: STERNAL WOUND & driveline IRRIGATION AND DEBRIDEMENT;  Surgeon: Kerin Perna, MD;  Location: [facility] OR;  Service: Thoracic;  Laterality: N/A;  . STERNAL WOUND DEBRIDEMENT N/A 01/31/2020   Procedure: STERNAL WOUND IRRIGATION;  Surgeon: Kerin Perna, MD;  Location: [facility] OR;  Service: Thoracic;  Laterality: N/A;  MESONIC JET  . STERNAL WOUND DEBRIDEMENT N/A 02/04/2020   Procedure: STERNAL WOUND DEBRIDEMENT WITH WOUND VAC CHANGE;  Surgeon: Kerin Perna, MD;  Location: [facility] OR;  Service: Thoracic;  Laterality: N/A;  . TEE WITHOUT CARDIOVERSION N/A 08/03/2018   Procedure: TRANSESOPHAGEAL ECHOCARDIOGRAM (TEE);  Surgeon: Alleen Borne, MD;  Location: [facility] OR;  Service: Open Heart Surgery;  Laterality: N/A;       No family history on file.  Social History   Tobacco Use  . Smoking status: Current Every Day Smoker    Types: Cigarettes    Start date: 2003  . Smokeless tobacco: Never Used  . Tobacco comment: Nicoderm patch   Substance Use Topics  . Alcohol use: Not Currently  . Drug use: Not Currently    Home Medications Prior to Admission medications   Medication Sig Start Date End Date Taking? Authorizing Provider  amLODipine (NORVASC) 5 MG tablet Take 2 tablets (10 mg total) by mouth daily. 09/29/19 03/28/20  Laurey Morale, MD  aspirin EC 81 MG tablet Take 81 mg by mouth daily.    [provider]  carbamide peroxide (DEBROX) 6.5 % OTIC solution Place 5 drops into both ears 3 (three) times daily. Patient taking differently: Place 5 drops into both ears 2 (two) times daily.  12/10/18   Clegg, Amy D, NP  cephALEXin (KEFLEX) 500 MG capsule Take 1 capsule (500 mg total) by mouth 3 (three) times daily. Patient taking differently: Take 500 mg by mouth 4 (four) times daily.  03/23/20   Blanchard Kelch, NP  cyanocobalamin 500 MCG tablet Take 1,000 mcg by  mouth daily.     [provider]  docusate sodium (COLACE) 100 MG capsule Take 2 capsules (200 mg total) by mouth daily as needed for mild constipation. Patient not taking: Reported on 03/28/2020 08/12/18   Tonye Becket D, NP  doxycycline (VIBRAMYCIN) 100 MG capsule Take 1 capsule (100 mg total) by mouth 2 (two) times daily for 14 days. 04/14/20 04/28/20  Kerin Perna, MD  fluticasone (FLONASE) 50 MCG/ACT nasal spray Place 1 spray into both nostrils 2 (two) times daily as needed for allergies.     [provider]  gabapentin (NEURONTIN) 300 MG capsule Take 2 capsules (600 mg total) by mouth 4 (four) times daily. 03/28/20 04/27/20  Laurey Morale, MD  glucose blood (CONTOUR NEXT TEST) test strip Use as instructed 12/18/18   Laurey Morale, MD  hydrALAZINE (APRESOLINE) 50 MG tablet Take 1 tablet (50 mg total) by mouth 3 (three) times daily. 02/09/20  Simmons, Brittainy M, PA-C  Ipratropium-Albuterol (COMBIVENT) 20-100 MCG/ACT AERS respimat Inhale 1 puff into the lungs every 6 (six) hours as needed for wheezing or shortness of breath.     [provider]  Lancets (FREESTYLE) lancets Use as instructed 12/18/18   Larey Dresser, MD  nystatin (MYCOSTATIN/NYSTOP) powder Apply 1 application topically 3 (three) times daily. 02/15/20   Larey Dresser, MD  pantoprazole (PROTONIX) 40 MG tablet Take 1 tablet (40 mg total) by mouth daily. 02/09/20   Lyda Jester M, PA-C  sacubitril-valsartan (ENTRESTO) 97-103 MG Take 1 tablet by mouth 2 (two) times daily. 09/15/19   Larey Dresser, MD  sildenafil (VIAGRA) 100 MG tablet Take 50 mg by mouth daily as needed for erectile dysfunction.    [provider]  spironolactone (ALDACTONE) 25 MG tablet Take 1 tablet (25 mg total) by mouth daily. 02/09/20   Lyda Jester M, PA-C  thiamine 100 MG tablet Take 1 tablet (100 mg total) by mouth daily. 04/05/19   Georgiana Shore, NP  traMADol (ULTRAM) 50 MG tablet Take 2 tablets (100 mg  total) by mouth every 6 (six) hours as needed for severe pain. Patient not taking: Reported on 03/28/2020 02/24/20   Lyda Jester M, PA-C  varenicline (CHANTIX CONTINUING MONTH PAK) 1 MG tablet Take 1 mg by mouth daily.     [provider]  warfarin (COUMADIN) 5 MG tablet Take 7.5 mg (1.5 tab) MWF and 5 mg all other days or as directed by HF Clinic. 02/16/20   Bensimhon, Shaune Pascal, MD  Zinc Gluconate 100 MG TABS Take 1 tablet (100 mg total) by mouth daily. Patient taking differently: Take 100 mg by mouth 2 (two) times a day.  12/15/18   Ivin Poot, MD  zolpidem (AMBIEN) 5 MG tablet TAKE 1 TABLET BY MOUTH EVERY DAY AT BEDTIME 02/15/20   Larey Dresser, MD    Allergies    Chlorhexidine and Other  Review of Systems   Review of Systems  Unable to perform ROS: Acuity of condition    Physical Exam Updated Vital Signs BP (!) 66/0 (BP Location: Right Arm)   Pulse 89   Temp 98.3 F (36.8 C) (Oral)   Resp (!) 21   SpO2 95%   Physical Exam Vitals and nursing note reviewed.  Constitutional:      General: He is not in acute distress.    Appearance: He is well-developed. He is ill-appearing and diaphoretic.  HENT:     Head: Normocephalic and atraumatic.     Mouth/Throat:     Pharynx: No oropharyngeal exudate.  Eyes:     Conjunctiva/sclera: Conjunctivae normal.     Pupils: Pupils are equal, round, and reactive to light.  Neck:     Comments: No meningismus. Cardiovascular:     Rate and Rhythm: Normal rate and regular rhythm.     Heart sounds: Normal heart sounds. No murmur.     Comments: LVAD hum Pulmonary:     Effort: Pulmonary effort is normal. No respiratory distress.     Breath sounds: Normal breath sounds.     Comments:   There is a sternal wound which is oozing bright red blood approximately 2 cm in size.  No pulsatile bleed Chest:     Chest wall: Tenderness present.  Abdominal:     Palpations: Abdomen is soft.     Tenderness: There is no abdominal  tenderness. There is no guarding or rebound.     Comments: Driveline  in place  Musculoskeletal:        General: No tenderness. Normal range of motion.     Cervical back: Normal range of motion and neck supple.  Skin:    General: Skin is warm.  Neurological:     Mental Status: He is alert and oriented to person, place, and time.     Cranial Nerves: No cranial nerve deficit.     Motor: No abnormal muscle tone.     Coordination: Coordination normal.     Comments: No ataxia on finger to nose bilaterally. No pronator drift. 5/5 strength throughout. CN 2-12 intact.Equal grip strength. Sensation intact.   Psychiatric:        Behavior: Behavior normal.     ED Results / Procedures / Treatments   Labs (all labs ordered are listed, but only abnormal results are displayed) Labs Reviewed  SARS CORONAVIRUS 2 BY RT PCR (HOSPITAL ORDER, PERFORMED IN [facility]) - Abnormal; Notable for the following components:      Result Value   SARS Coronavirus 2 POSITIVE (*)    All other components within normal limits  CBC WITH DIFFERENTIAL/PLATELET - Abnormal; Notable for the following components:   WBC 16.6 (*)    RBC 3.64 (*)    Hemoglobin 10.5 (*)    HCT 33.6 (*)    Platelets 424 (*)    Neutro Abs 10.9 (*)    Monocytes Absolute 1.6 (*)    Abs Immature Granulocytes 0.10 (*)    All other components within normal limits  COMPREHENSIVE METABOLIC PANEL - Abnormal; Notable for the following components:   CO2 21 (*)    Glucose, Bld 178 (*)    Albumin 3.4 (*)    All other components within normal limits  PROTIME-INR - Abnormal; Notable for the following components:   Prothrombin Time 26.2 (*)    INR 2.5 (*)    All other components within normal limits  CBG MONITORING, ED - Abnormal; Notable for the following components:   Glucose-Capillary 152 (*)    All other components within normal limits  CULTURE, BLOOD (ROUTINE X 2)  CULTURE, BLOOD (ROUTINE X 2)  LACTATE DEHYDROGENASE  TYPE AND  SCREEN  PREPARE RBC (CROSSMATCH)  PREPARE FRESH FROZEN PLASMA  PREPARE RBC (CROSSMATCH)    EKG EKG Interpretation  Date/Time:  Sunday Apr 16 2020 04:22:27 EDT Ventricular Rate:  89 PR Interval:    QRS Duration: 58 QT Interval:  459 QTC Calculation: 559 R Axis:   -61 Text Interpretation: Sinus rhythm Probable left atrial enlargement Inferior infarct, old Prolonged QT interval Artifact No significant change was found Confirmed by Glynn Octave [phone]) on 04/16/2020 4:39:43 AM   Radiology DG Chest Portable 1 View  Result Date: 04/16/2020 CLINICAL DATA:  Dyspnea EXAM: PORTABLE CHEST 1 VIEW COMPARISON:  02/01/2020 chest radiograph. FINDINGS: Intact sternotomy wires. Partially visualized LVAD overlying the left ventricular apex. Stable cardiomediastinal silhouette with mild cardiomegaly. No pneumothorax. No pleural effusion. No overt pulmonary edema. No acute consolidative airspace disease. IMPRESSION: Stable mild cardiomegaly without overt pulmonary edema. No active pulmonary disease. Electronically Signed   By: Delbert Phenix M.D.   On: 04/16/2020 04:54    Procedures .Marland KitchenLaceration Repair  Date/Time: 04/16/2020 4:42 AM Performed by: Glynn Octave, MD Authorized by: Glynn Octave, MD   Consent:    Consent obtained:  Emergent situation   Consent given by:  Patient   Risks discussed:  Infection, poor wound healing, vascular damage, tendon damage, retained foreign body, pain, poor cosmetic  result and need for additional repair   Alternatives discussed:  No treatment Anesthesia (see MAR for exact dosages):    Anesthesia method:  Local infiltration   Local anesthetic:  Lidocaine 2% WITH epi Laceration details:    Location:  Trunk   Trunk location:  L chest   Length (cm):  2 Exploration:    Hemostasis achieved with:  Epinephrine and direct pressure Skin repair:    Repair method:  Sutures   Suture size:  4-0   Wound skin closure material used: vicryl.   Number of sutures:   3 Approximation:    Approximation:  Close Post-procedure details:    Dressing:  Sterile dressing   Patient tolerance of procedure:  Tolerated well, no immediate complications .Critical Care Performed by: Glynn Octave, MD Authorized by: Glynn Octave, MD   Critical care provider statement:    Critical care time (minutes):  75   Critical care was necessary to treat or prevent imminent or life-threatening deterioration of the following conditions:  Shock   Critical care was time spent personally by me on the following activities:  Discussions with consultants, evaluation of patient's response to treatment, examination of patient, ordering and performing treatments and interventions, ordering and review of laboratory studies, ordering and review of radiographic studies, pulse oximetry, re-evaluation of patient's condition, obtaining history from patient or surrogate and review of old charts   (including critical care time)  Medications Ordered in ED Medications  lidocaine-EPINEPHrine (XYLOCAINE W/EPI) 2 %-1:200000 (PF) injection (has no administration in time range)  tranexamic acid (CYKLOKAPRON) 1000 MG/10ML topical solution 500 mg (has no administration in time range)  phenylephrine (NEOSYNEPHRINE) 10-0.9 MG/250ML-% infusion (has no administration in time range)    ED Course  I have reviewed the triage vital signs and the nursing notes.  Pertinent labs & imaging results that were available during my care of the patient were reviewed by me and considered in my medical decision making (see chart for details).    MDM Rules/Calculators/A&P                     Patient is alert and ill-appearing.  Map is 66 by Doppler.  There is active bleeding from the sternal wound the patient is diaphoretic complaining of shortness of breath.  Pressure was applied and wound was injected with lidocaine with epinephrine.  Topical TXA applied and sutures placed to control bleeding.  Rapid response  nurse and LVAD coordinator at bedside.  Chart reviewed.  Patient seems to have a chronic wound in this area that has been debrided in the clinic.  Discussed with Dr. Cliffton Asters who is covering for Dr. Maren Beach. He will consult on patient after he is admitted.  Hemoglobin stable at 10.5. INR 2.5.  Bleeding has improved after sutures placed. TXA applied topically.  Discussed with Dr. Maren Beach who has come to the bedside and evaluated patient.  He recommends CT chest abdomen pelvis without contrast.  Will escalate antibiotics to include Zosyn.  Transfusing FFP as well as packed red blood cells.  Discussed with Dr. Shirlee Latch of cardiology.  He agrees with antibiotics, trying to wean Neo-Synephrine and transfuse FFP and packed red blood cells.  He will place admission orders.  Patient map improving and mental status improving.  His diaphoresis has resolved.  Denies chest pain or shortness of breath.  CT scan pending.  Covid did return positive.  Patient with no respiratory distress or increased work of breathing.  Discussed with Dr. Shirlee Latch at bedside.  CT scan pending.  Map has improved and will try to titrate down Neo-Synephrine. PRBCs and FFP ordered.  Theodore Welch was evaluated in Emergency Department on 04/16/2020 for the symptoms described in the history of present illness. He was evaluated in the context of the global COVID-19 pandemic, which necessitated consideration that the patient might be at risk for infection with the SARS-CoV-2 virus that causes COVID-19. Institutional protocols and algorithms that pertain to the evaluation of patients at risk for COVID-19 are in a state of rapid change based on information released by regulatory bodies including the CDC and federal and state organizations. These policies and algorithms were followed during the patient's care in the ED.  Final Clinical Impression(s) / ED Diagnoses Final diagnoses:  None    Rx / DC Orders ED Discharge Orders     None       Fumiye Lubben, Jeannett Senior, MD 04/16/20 [phone]

## 2020-04-16 NOTE — Progress Notes (Addendum)
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  He previously required debridement of lower sternal wound with wound VAC therapy and IV antibiotics.  This was completed with 3 surgical procedures including debridement and ACell application in March 2021.  The lower sternal wound eventually healed in secondarily.    The exit site in the left upper quadrant is incorporated but still with some drainage.  More recently, the lower sternal incision reopened with serosanguineous drainage culturing MSSA.  He has been on oral antibiotics, daily dressing changes and weekly wound checks in the [facility] clinic.  Recently the drainage has been more bloody but his hemoglobin has remained stable.  He was admitted to the ED earlier today with persistent bleeding from the wound which could not be controlled with pressure.  On presentation to the ED his mean arterial pressure was 62, sinus rhythm, O2 sats 95 and afebrile.  His white count showed an increased to 18,000 from 12,000 and blood cultures were taken.  The ER physician put a suture in the small opening to control the bleeding.  Patient will be admitted.  His INR is 2.5 and he will receive FFP to reverse his anticoagulation.  Hemoglobin is stable at 10 g.  A CT scan of the chest and abdomen is pending.  He will need wound exploration after he receives IV antibiotics and INR is corrected, probably used a.m.  I suspect the bleeding is from a soft tissue vessel in the power cord tunnel.  Ultimately the patient may need a muscle flap to cover the proximal portion of the power cord.  Plan of care discussed with patient and wife in the ED.  LVAD parameters are stable with flow 4.7, PI 4.4, speed 5700 RPM  Objective: Vital signs in last 24 hours: Temp:  [98.3 F (36.8 C)] 98.3 F (36.8 C) (05/30 0411) Pulse Rate:  [88-90] 90 (05/30 0446) Resp:   [16-22] 18 (05/30 0515) BP: (66-100)/(0-71) 87/66 (05/30 0515) SpO2:  [80 %-95 %] 80 % (05/30 0446) Weight:  [89.4 kg] 89.4 kg (05/30 0416)  Hemodynamic parameters for last 24 hours:    Intake/Output from previous day: No intake/output data recorded. Intake/Output this shift: No intake/output data recorded.     Exam Middle-age AA male in ED anxious but no acute distress Mean arterial pressure 75 on 20 mcg neo through peripheral IV Normal VAD hum Neuro intact Lungs clear Serosanguineous drainage from 5 mm opening in lower sternal scar No peripheral edema extremities warm  Lab Results: Recent Labs    04/16/20 0408  WBC 16.6*  HGB 10.5*  HCT 33.6*  PLT 424*   BMET:  Recent Labs    04/16/20 0408  NA 137  K 4.8  CL 105  CO2 21*  GLUCOSE 178*  BUN 12  CREATININE 1.01  CALCIUM 9.3    PT/INR:  Recent Labs    04/16/20 0408  LABPROT 26.2*  INR 2.5*   ABG    Component Value Date/Time   PHART 7.625 (HH) 01/25/2020 2108   HCO3 23.6 01/25/2020 2108   TCO2 26 08/04/2018 1622   O2SAT 97.9 01/25/2020 2108   CBG (last 3)  Recent Labs    04/16/20 0359  GLUCAP 152*    Assessment/Plan: 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  Now with bleeding from power cord tunnel infection site  Plan CT scan, blood cultures,  IV vancomycin and Zosyn and reversal of INR for wound exploration in OR probably Tuesday a.m.  Patient's Covid rapid screen just returned positive.  Change lower sternal dressing as needed.    LOS: 0 days    Tharon Aquas Trigt III 04/16/2020

## 2020-04-16 NOTE — Progress Notes (Addendum)
Pharmacy Antibiotic Note  Theodore Welch is a 59 y.o. male admitted on 04/16/2020 with wound infection.  Pharmacy has been consulted for vancomycin dosing.  Plan: Vancomycin 1750 mg IV x 1 then 1250 IV q12 hours F/u renal function, cultures and clinical course  Height: 5\' 11"  (180.3 cm) Weight: 89.4 kg (197 lb) IBW/kg (Calculated) : 75.3  Temp (24hrs), Avg:98.3 F (36.8 C), Min:98.3 F (36.8 C), Max:98.3 F (36.8 C)  Recent Labs  Lab 04/10/20 0902 04/16/20 0408  WBC 9.5 16.6*    Estimated Creatinine Clearance: 89.8 mL/min (by C-G formula based on SCr of 0.92 mg/dL).    Allergies  Allergen Reactions  . Chlorhexidine Rash  . Other Rash    Prep pads    Thank you for allowing pharmacy to be a part of this patient's care.  04/18/20 [place] 04/16/2020 4:45 AM   Addum:  Add zosyn 3.375 gm IV q8 hours.

## 2020-04-17 DIAGNOSIS — Z95811 Presence of heart assist device: Secondary | ICD-10-CM

## 2020-04-17 DIAGNOSIS — T827XXA Infection and inflammatory reaction due to other cardiac and vascular devices, implants and grafts, initial encounter: Secondary | ICD-10-CM

## 2020-04-17 DIAGNOSIS — I5043 Acute on chronic combined systolic (congestive) and diastolic (congestive) heart failure: Secondary | ICD-10-CM

## 2020-04-17 LAB — BPAM FFP
Blood Product Expiration Date: 202106022359
Blood Product Expiration Date: 202106042359
Blood Product Expiration Date: 202106042359
ISSUE DATE / TIME: 202105300549
ISSUE DATE / TIME: 202105301203
ISSUE DATE / TIME: 202105301323
Unit Type and Rh: 5100
Unit Type and Rh: 5100
Unit Type and Rh: 5100

## 2020-04-17 LAB — CBC
HCT: 23.4 % — ABNORMAL LOW (ref 39.0–52.0)
HCT: 29.3 % — ABNORMAL LOW (ref 39.0–52.0)
Hemoglobin: 7.4 g/dL — ABNORMAL LOW (ref 13.0–17.0)
Hemoglobin: 9.7 g/dL — ABNORMAL LOW (ref 13.0–17.0)
MCH: 29 pg (ref 26.0–34.0)
MCH: 29.4 pg (ref 26.0–34.0)
MCHC: 31.6 g/dL (ref 30.0–36.0)
MCHC: 33.1 g/dL (ref 30.0–36.0)
MCV: 91.8 fL (ref 80.0–100.0)
MCV: 88.8 fL (ref 80.0–100.0)
Platelets: 268 10*3/uL (ref 150–400)
Platelets: 262 10*3/uL (ref 150–400)
RBC: 2.55 MIL/uL — ABNORMAL LOW (ref 4.22–5.81)
RBC: 3.3 MIL/uL — ABNORMAL LOW (ref 4.22–5.81)
RDW: 14.5 % (ref 11.5–15.5)
RDW: 14.2 % (ref 11.5–15.5)
WBC: 15 10*3/uL — ABNORMAL HIGH (ref 4.0–10.5)
WBC: 15.3 10*3/uL — ABNORMAL HIGH (ref 4.0–10.5)
nRBC: 0 % (ref 0.0–0.2)
nRBC: 0 % (ref 0.0–0.2)

## 2020-04-17 LAB — PREPARE FRESH FROZEN PLASMA
Unit division: 0
Unit division: 0

## 2020-04-17 LAB — BASIC METABOLIC PANEL
Anion gap: 14 (ref 5–15)
BUN: 13 mg/dL (ref 8–23)
CO2: 19 mmol/L — ABNORMAL LOW (ref 22–32)
Calcium: 9 mg/dL (ref 8.9–10.3)
Chloride: 102 mmol/L (ref 98–111)
Creatinine, Ser: 1.11 mg/dL (ref 0.61–1.24)
GFR calc Af Amer: >60 mL/min (ref >60)
GFR calc non Af Amer: >60 mL/min (ref >60)
Glucose, Bld: 151 mg/dL — ABNORMAL HIGH (ref 70–99)
Potassium: 4.4 mmol/L (ref 3.5–5.1)
Sodium: 135 mmol/L (ref 135–145)

## 2020-04-17 LAB — GLUCOSE, CAPILLARY
Glucose-Capillary: 105 mg/dL — ABNORMAL HIGH (ref 70–99)
Glucose-Capillary: 128 mg/dL — ABNORMAL HIGH (ref 70–99)
Glucose-Capillary: 91 mg/dL (ref 70–99)
Glucose-Capillary: 91 mg/dL (ref 70–99)
Glucose-Capillary: 155 mg/dL — ABNORMAL HIGH (ref 70–99)

## 2020-04-17 LAB — SARS CORONAVIRUS 2 BY RT PCR (HOSPITAL ORDER, PERFORMED IN ~~LOC~~ HOSPITAL LAB): SARS Coronavirus 2: NEGATIVE

## 2020-04-17 LAB — PREPARE RBC (CROSSMATCH)

## 2020-04-17 LAB — PROTIME-INR
INR: 1.9 — ABNORMAL HIGH (ref 0.8–1.2)
Prothrombin Time: 21.4 seconds — ABNORMAL HIGH (ref 11.4–15.2)

## 2020-04-17 LAB — LACTATE DEHYDROGENASE: LDH: 194 U/L — ABNORMAL HIGH (ref 98–192)

## 2020-04-17 MED ORDER — PHYTONADIONE 5 MG PO TABS
2.5000 mg | ORAL_TABLET | Freq: Once | ORAL | Status: AC
  Administered 2020-04-17: 2.5 mg via ORAL
  Filled 2020-04-17: qty 1

## 2020-04-17 MED ORDER — SODIUM CHLORIDE 0.9% IV SOLUTION: Freq: Once | INTRAVENOUS | Status: AC

## 2020-04-17 NOTE — Progress Notes (Signed)
Patient called RN that he was "bleeding bad". Gown and bed linen soaked with blood, and a tennis ball size clot found on  Bleeding sternal wound. Pressure was held for about 10 minutes for complete hemostasis. Pressure dressing applied to site. Patient stood up to use the commode and became dizzy and diaphoretic. Cuff  and doppler pressures were stable.     Patient had a run of bigeminy with subsequent drop in Flow to 2.5 and PI increased to 9.6. Numbers settle down to baseline. CBC lab sent stat. Paged VAD oncall abut drop in hemoglobin from 10.5 to 7.4 . Order received to give one unit of PRBC

## 2020-04-17 NOTE — Progress Notes (Signed)
  Subjective: Bleeding from abdominal wall incision over VAD power cord tunnel. INR remains elevated at 1.9.  We will give more FFP today in order to optimize his risk of bleeding during wound exploration and debridement tomorrow.  Objective: Vital signs in last 24 hours: Temp:  [98 F (36.7 C)-99.2 F (37.3 C)] 99.1 F (37.3 C) (05/31 0800) Pulse Rate:  [77-102] 77 (05/31 1000) Cardiac Rhythm: Normal sinus rhythm (05/31 0741) Resp:  [14-30] 15 (05/31 1100) BP: (91-111)/(75-97) 106/90 (05/31 0800) SpO2:  [93 %-100 %] 100 % (05/31 1000) Weight:  [86 kg] 86 kg (05/31 0659)  Hemodynamic parameters for last 24 hours:    Intake/Output from previous day: 05/30 0701 - 05/31 0700 In: 2553.9 [P.O.:540; I.V.:140.9; Blood:1282.8; IV Piggyback:590.2] Out: 3950 [Urine:3950] Intake/Output this shift: Total I/O In: 210 [IV Piggyback:210] Out: -     Lab Results: Recent Labs    04/17/20 0059 04/17/20 0546  WBC 15.0* 15.3*  HGB 7.4* 9.7*  HCT 23.4* 29.3*  PLT 268 262   BMET:  Recent Labs    04/16/20 0408 04/17/20 0059  NA 137 135  K 4.8 4.4  CL 105 102  CO2 21* 19*  GLUCOSE 178* 151*  BUN 12 13  CREATININE 1.01 1.11  CALCIUM 9.3 9.0    PT/INR:  Recent Labs    04/17/20 0059  LABPROT 21.4*  INR 1.9*   ABG    Component Value Date/Time   PHART 7.625 (HH) 01/25/2020 2108   HCO3 23.6 01/25/2020 2108   TCO2 26 08/04/2018 1622   O2SAT 97.9 01/25/2020 2108   CBG (last 3)  Recent Labs    04/16/20 1942 04/17/20 0645 04/17/20 1126  GLUCAP 143* 128* 91    Assessment/Plan: S/P HeartMate 3 placement 2019  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  Continue with FFP transfusion to improve INR prior to surgery.    LOS: 1 day    Theodore Welch 04/17/2020

## 2020-04-17 NOTE — Progress Notes (Addendum)
Advanced Heart Failure VAD Team Note  PCP-Cardiologist: No primary care provider on file.   Subjective:   Events  5/31 Acute blood loss sternal wound. Hb down to 7.4 Received 1PRBCs. Hgb up to 9.7   Anxious this morning. Denies pain.   LVAD INTERROGATION:  HeartMate III LVAD:   Flow 4/3  liters/min, speed 5700, power 5 PI 3.9  > 60 PI events 1 low flow  Objective:    Vital Signs:   Temp:  [98 F (36.7 C)-99.3 F (37.4 C)] 99.1 F (37.3 C) (05/31 0503) Pulse Rate:  [30-102] 91 (05/31 0503) Resp:  [14-31] 26 (05/31 0503) BP: (81-128)/(67-97) 94/83 (05/31 0503) SpO2:  [93 %-100 %] 99 % (05/31 0503) Weight:  [86 kg] 86 kg (05/31 0659) Last BM Date: (PTA) Mean arterial Pressure 80-90s  Intake/Output:   Intake/Output Summary (Last 24 hours) at 04/17/2020 0705 Last data filed at 04/17/2020 0651 Gross per 24 hour  Intake 2553.93 ml  Output 3950 ml  Net -1396.07 ml     Physical Exam    General:  No resp difficulty HEENT: normal Neck: supple. JVP flat  . Carotids 2+ bilat; no bruits. No lymphadenopathy or thyromegaly appreciated. Cor: Mechanical heart sounds with LVAD hum present. Sternal wound dressing dry and intact   Lungs: clear Abdomen: soft, nontender, nondistended. No hepatosplenomegaly. No bruits or masses. Good bowel sounds. Driveline: C/D/I; securement device intact and driveline incorporated Extremities: no cyanosis, clubbing, rash, edema Neuro: alert & orientedx3, cranial nerves grossly intact. moves all 4 extremities w/o difficulty. Affect pleasant   Telemetry   SR with PVCs 80-90s   EKG   n/a   Labs   Basic Metabolic Panel: Recent Labs  Lab 04/16/20 0408 04/17/20 0059  NA 137 135  K 4.8 4.4  CL 105 102  CO2 21* 19*  GLUCOSE 178* 151*  BUN 12 13  CREATININE 1.01 1.11  CALCIUM 9.3 9.0    Liver Function Tests: Recent Labs  Lab 04/16/20 0408  AST 19  ALT 14  ALKPHOS 105  BILITOT 0.6  PROT 7.1  ALBUMIN 3.4*   No results for  input(s): LIPASE, AMYLASE in the last 168 hours. No results for input(s): AMMONIA in the last 168 hours.  CBC: Recent Labs  Lab 04/10/20 0902 04/16/20 0408 04/17/20 0059 04/17/20 0546  WBC 9.5 16.6* 15.0* 15.3*  NEUTROABS  --  10.9*  --   --   HGB 11.5* 10.5* 7.4* 9.7*  HCT 36.6* 33.6* 23.4* 29.3*  MCV 92.2 92.3 91.8 88.8  PLT 318 424* 268 262    INR: Recent Labs  Lab 04/10/20 0902 04/16/20 0408 04/17/20 0059  INR 2.1* 2.5* 1.9*    Other results:  EKG:    Imaging   CT ABDOMEN PELVIS WO CONTRAST  Result Date: 04/16/2020 CLINICAL DATA:  Left ventricular assist device. Chest site bleeding. EXAM: CT CHEST, ABDOMEN AND PELVIS WITHOUT CONTRAST TECHNIQUE: Multidetector CT imaging of the chest, abdomen and pelvis was performed following the standard protocol without IV contrast. COMPARISON:  01/25/2020 CT chest, abdomen and pelvis. FINDINGS: CT CHEST FINDINGS Cardiovascular: Mild cardiomegaly. No significant pericardial effusion/thickening. Stable position of left ventricular cyst devise with intact appearing left abdominal driveline and outflow tract cannula anastomosed with the ascending thoracic aorta. There is a small amount of superficial fluid density in the midline chest wall anterior to the xiphoid process measured 2.7 cm width (series 3/image 49), decreased from 3.5 cm on 01/25/2020 CT. A small amount of soft tissue tracking along  the proximal drive line in the subcutaneous ventral left abdominal wall (series 3/image 78) is not appreciably changed. No new focal fluid collections along the drive line or along the left ventricular assist device components. Atherosclerotic nonaneurysmal thoracic aorta. Normal caliber pulmonary arteries. Mediastinum/Nodes: No discrete thyroid nodules. Unremarkable esophagus. No pathologically enlarged axillary, mediastinal or hilar lymph nodes, noting limited sensitivity for the detection of hilar adenopathy on this noncontrast study. No  pneumomediastinum. No acute mediastinal hematoma. Lungs/Pleura: No pneumothorax. No pleural effusion. Mild hypoventilatory changes in the dependent lower lobes bilaterally. No acute consolidative airspace disease, lung masses or significant pulmonary nodules. Musculoskeletal: No aggressive appearing focal osseous lesions. Intact sternotomy wires. Symmetric mild bilateral gynecomastia, unchanged. CT ABDOMEN PELVIS FINDINGS Hepatobiliary: Normal liver with no liver mass. Normal gallbladder with no radiopaque cholelithiasis. No biliary ductal dilatation. Pancreas: Normal, with no mass or duct dilation. Spleen: Normal size. No mass. Adrenals/Urinary Tract: Normal adrenals. No renal stones. No hydronephrosis. Simple 3.1 cm anterior interpolar right renal cyst. No additional contour deforming renal lesions. Normal bladder. Stomach/Bowel: Normal non-distended stomach. Normal caliber small bowel with no small bowel wall thickening. Normal appendix. Mild left colonic diverticulosis, with no large bowel wall thickening or significant pericolonic fat stranding. Vascular/Lymphatic: Atherosclerotic nonaneurysmal abdominal aorta. No pathologically enlarged lymph nodes in the abdomen or pelvis. Reproductive: Normal size prostate with nonspecific internal prostatic calcifications. Other: No pneumoperitoneum, ascites or focal fluid collection. Musculoskeletal: No aggressive appearing focal osseous lesions. Moderate lower lumbar spondylosis. IMPRESSION: 1. No evidence of acute LVAD complication on this noncontrast scan. Small amount of chronic superficial fluid density in the midline chest wall anterior to the xiphoid process is decreased since 01/25/2020 CT. Stable small amount of soft tissue tracking along the proximal drive line in the subcutaneous ventral left abdominal wall. No new fluid collections. No acute mediastinal or intra-abdominal hematoma. 2. Mild cardiomegaly. 3. No acute abnormality in the abdomen or pelvis. 4. Mild  left colonic diverticulosis. 5. Aortic Atherosclerosis (ICD10-I70.0). Electronically Signed   By: Ilona Sorrel M.D.   On: 04/16/2020 07:45   CT CHEST WO CONTRAST  Result Date: 04/16/2020 CLINICAL DATA:  Left ventricular assist device. Chest site bleeding. EXAM: CT CHEST, ABDOMEN AND PELVIS WITHOUT CONTRAST TECHNIQUE: Multidetector CT imaging of the chest, abdomen and pelvis was performed following the standard protocol without IV contrast. COMPARISON:  01/25/2020 CT chest, abdomen and pelvis. FINDINGS: CT CHEST FINDINGS Cardiovascular: Mild cardiomegaly. No significant pericardial effusion/thickening. Stable position of left ventricular cyst devise with intact appearing left abdominal driveline and outflow tract cannula anastomosed with the ascending thoracic aorta. There is a small amount of superficial fluid density in the midline chest wall anterior to the xiphoid process measured 2.7 cm width (series 3/image 49), decreased from 3.5 cm on 01/25/2020 CT. A small amount of soft tissue tracking along the proximal drive line in the subcutaneous ventral left abdominal wall (series 3/image 78) is not appreciably changed. No new focal fluid collections along the drive line or along the left ventricular assist device components. Atherosclerotic nonaneurysmal thoracic aorta. Normal caliber pulmonary arteries. Mediastinum/Nodes: No discrete thyroid nodules. Unremarkable esophagus. No pathologically enlarged axillary, mediastinal or hilar lymph nodes, noting limited sensitivity for the detection of hilar adenopathy on this noncontrast study. No pneumomediastinum. No acute mediastinal hematoma. Lungs/Pleura: No pneumothorax. No pleural effusion. Mild hypoventilatory changes in the dependent lower lobes bilaterally. No acute consolidative airspace disease, lung masses or significant pulmonary nodules. Musculoskeletal: No aggressive appearing focal osseous lesions. Intact sternotomy wires. Symmetric mild bilateral  gynecomastia, unchanged. CT ABDOMEN PELVIS FINDINGS Hepatobiliary: Normal liver with no liver mass. Normal gallbladder with no radiopaque cholelithiasis. No biliary ductal dilatation. Pancreas: Normal, with no mass or duct dilation. Spleen: Normal size. No mass. Adrenals/Urinary Tract: Normal adrenals. No renal stones. No hydronephrosis. Simple 3.1 cm anterior interpolar right renal cyst. No additional contour deforming renal lesions. Normal bladder. Stomach/Bowel: Normal non-distended stomach. Normal caliber small bowel with no small bowel wall thickening. Normal appendix. Mild left colonic diverticulosis, with no large bowel wall thickening or significant pericolonic fat stranding. Vascular/Lymphatic: Atherosclerotic nonaneurysmal abdominal aorta. No pathologically enlarged lymph nodes in the abdomen or pelvis. Reproductive: Normal size prostate with nonspecific internal prostatic calcifications. Other: No pneumoperitoneum, ascites or focal fluid collection. Musculoskeletal: No aggressive appearing focal osseous lesions. Moderate lower lumbar spondylosis. IMPRESSION: 1. No evidence of acute LVAD complication on this noncontrast scan. Small amount of chronic superficial fluid density in the midline chest wall anterior to the xiphoid process is decreased since 01/25/2020 CT. Stable small amount of soft tissue tracking along the proximal drive line in the subcutaneous ventral left abdominal wall. No new fluid collections. No acute mediastinal or intra-abdominal hematoma. 2. Mild cardiomegaly. 3. No acute abnormality in the abdomen or pelvis. 4. Mild left colonic diverticulosis. 5. Aortic Atherosclerosis (ICD10-I70.0). Electronically Signed   By: Delbert Phenix M.D.   On: 04/16/2020 07:45   DG Chest Portable 1 View  Result Date: 04/16/2020 CLINICAL DATA:  Dyspnea EXAM: PORTABLE CHEST 1 VIEW COMPARISON:  02/01/2020 chest radiograph. FINDINGS: Intact sternotomy wires. Partially visualized LVAD overlying the left  ventricular apex. Stable cardiomediastinal silhouette with mild cardiomegaly. No pneumothorax. No pleural effusion. No overt pulmonary edema. No acute consolidative airspace disease. IMPRESSION: Stable mild cardiomegaly without overt pulmonary edema. No active pulmonary disease. Electronically Signed   By: Delbert Phenix M.D.   On: 04/16/2020 04:54      Medications:     Scheduled Medications: . gabapentin  600 mg Oral QID  . insulin aspart  0-15 Units Subcutaneous TID WC  . insulin aspart  0-5 Units Subcutaneous QHS  . mupirocin cream   Topical BID  . pantoprazole  40 mg Oral Daily  . thiamine  100 mg Oral Daily  . varenicline  1 mg Oral Daily  . vitamin B-12  1,000 mcg Oral Daily  . zinc sulfate  220 mg Oral Daily     Infusions: .  ceFAZolin (ANCEF) IV 2 g (04/17/20 0651)  . phenylephrine (NEO-SYNEPHRINE) Adult infusion Stopped (04/16/20 1342)     PRN Medications:  acetaminophen, fluticasone, Ipratropium-Albuterol, ondansetron (ZOFRAN) IV, zolpidem   Patient Profile     Assessment/Plan:    1. 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 He previously required debridement of lower sternalwound with woundVAC therapy and IV antibiotics.This was completed with 3 surgical procedures including debridement and ACell application in March 2021.  More recently, the lower sternal wound reopened with drainage again culturing MSSA and he had been on cephalexin + doxycycline.  The day of admission, he developed persistent bleeding from the wound site and came to ER, bleeding now stopped with suture.  Bleeding again last night, now stopped again. Got an additional 1 unit PRBCs this morning. Hgb 7.4 => 9.7.   - Overall he has received 2 units PRBCs + FFP.  - ID consulted and switched antibiotics to to cefazolin.   - CT chest/abd/pelvis--> Stable small amount of soft tissue tracking along the proximal drive line in the subcutaneous  ventral left abdominal wall.  -INR down 1.9  - Return to OR tomorrow.   2. COVID-19 infection: Asymptomatic with no fever, no cough/dyspnea.  WBCS 16.6-->15.2 - Ferritin normal, D dimer 0.96.  LDH stable 194. - Discussed monoclonal antibody infusion with Dr. Kendrick Fries => hold off for now as not clear that he is actively infected.   - Per ID, maintain airborne precautions but can recheck COVID-19 testing tomorrow.  3. 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 LVAD parameters stable.  Has been NYHA class II. He does not look volume overloaded.  MAP low initially, started on phenylephrine (may have been at least partly vagal as very diaphoretic with bleeding).   - Maps 80-90s, now off phenylephrine.  - Volume status stable.  - He does not need Lasix.   - For now, will hold spironolactone, Entresto, amlodipine.  - Hold ASA and warfarin with bleeding.  4. Smoking:He has cut back a lot, still taking Chantix. Encouraged to quit. 5. H/o RLE DVT: On warfarin for LVAD but held for wound exploration tomorrow.  6. OSA: Continue CPAP.  7. Hyperlipidemia: Atorvastatin.  8. Type II diabetes:SSI 9. Anemia, Acute blood loss: Hgb down to 7.4 early am. Received 1UPRBCs up to 9.7 - Follow CBC, repeat in afternoon.    I reviewed the LVAD parameters from today, and compared the results to the patient's prior recorded data.  No programming changes were made.  The LVAD is functioning within specified parameters.  The patient performs LVAD self-test daily.  LVAD interrogation was negative for any significant power changes, alarms or PI events/speed drops.  LVAD equipment check completed and is in good working order.  Back-up equipment present.   LVAD education done on emergency procedures and precautions and reviewed exit site care.  Length of Stay: 1  Amy Clegg, NP 04/17/2020, 7:05 AM  VAD Team --- VAD ISSUES ONLY--- Pager [phone] (7am - 7am)  Advanced Heart  Failure Team  Pager [phone] (M-F; 7a - 4p)  Please contact [facility] for night-coverage after hours (4p -7a ) and weekends on [email or web address]  Patient seen with NP, agree with the above note.   He had further bleeding from sternal wound site last night, now stopped.  He had another unit PRBCs this morning, hgb up to 9.7.  He is anxious about the bleeding.  Now on cefazolin alone for driveline infection.   General: Well appearing this am. NAD.  HEENT: Normal. Neck: Supple, JVP 7-8 cm. Carotids OK.  Cardiac:  Mechanical heart sounds with LVAD hum present.  Lungs:  CTAB, normal effort.  Abdomen:  NT, ND, no HSM. No bruits or masses. +BS  LVAD exit site: Well-healed and incorporated. Dressing dry and intact. No erythema or drainage. Stabilization device present and accurately applied. Driveline dressing changed daily per sterile technique. Extremities:  Warm and dry. No cyanosis, clubbing, rash, or edema.  Neuro:  Alert & oriented x 3. Cranial nerves grossly intact. Moves all 4 extremities w/o difficulty. Affect pleasant    To OR tomorrow to explore sternal wound.  Repeat CBC this afternoon, with active bleeding transfuse hgb < 8.   ID has seen, continue on cefazolin for now.  CT chest/abdomen/pelvis did not show evidence for worsening infection.   He will need to remain on COVID-19 precautions for now though it is not clear that he is actively infected.  He is asymptomatic. Monoclonal antibodies not recommended.   Marca Ancona 04/17/2020 9:16 AM

## 2020-04-17 NOTE — Progress Notes (Signed)
[facility]    Date of Admission:  04/16/2020   Total days of antibiotics 3           ID: Theodore Welch is a 59 y.o. male with ongoing MSSA driveline infection admitted for recurrent bleeding from sternal wound, and ongoing sternal wound infection+ on cx Active Problems:   Infection associated with driveline of left ventricular assist device (LVAD) (HCC)    Subjective: Had episode of bleeding last night, remains afebrile. Received a unit of blood transfusion  He is trying to find out how he acquired covid. He reports being quite safe, wearing mask, and is fully vaccinated. Denies any recent symptoms. He denies cough, no loss of taste of smell.  Medications:  . gabapentin  600 mg Oral QID  . insulin aspart  0-15 Units Subcutaneous TID WC  . insulin aspart  0-5 Units Subcutaneous QHS  . mupirocin cream   Topical BID  . pantoprazole  40 mg Oral Daily  . thiamine  100 mg Oral Daily  . varenicline  1 mg Oral Daily  . vitamin B-12  1,000 mcg Oral Daily  . zinc sulfate  220 mg Oral Daily    Objective: Vital signs in last 24 hours: Temp:  [98 F (36.7 C)-99.2 F (37.3 C)] 99.1 F (37.3 C) (05/31 0800) Pulse Rate:  [77-102] 77 (05/31 1000) Resp:  [14-30] 15 (05/31 1100) BP: (91-111)/(75-97) 106/90 (05/31 0800) SpO2:  [93 %-100 %] 100 % (05/31 1000) Weight:  [86 kg] 86 kg (05/31 0659) Physical Exam  Constitutional: He is oriented to person, place, and time. He appears well-developed and well-nourished. No distress.  HENT:  Mouth/Throat: Oropharynx is clear and moist. No oropharyngeal exudate.  Chest wall = bandaged with small shadow/spotting from bleeding. Abdominal: Soft. Bowel sounds are normal. He exhibits no distension. There is no tenderness.  Neurological: He is alert and oriented to person, place, and time.  Skin: Skin is warm and dry. No rash noted. No erythema.  Psychiatric: He has a normal mood and affect. His behavior is normal.      Lab Results Recent Labs    04/16/20 0408 04/16/20 0408 04/17/20 0059 04/17/20 0546  WBC 16.6*   < > 15.0* 15.3*  HGB 10.5*   < > 7.4* 9.7*  HCT 33.6*   < > 23.4* 29.3*  NA 137  --  135  --   K 4.8  --  4.4  --   CL 105  --  102  --   CO2 21*  --  19*  --   BUN 12  --  13  --   CREATININE 1.01  --  1.11  --    < > = values in this interval not displayed.   Liver Panel Recent Labs    04/16/20 0408  PROT 7.1  ALBUMIN 3.4*  AST 19  ALT 14  ALKPHOS 105  BILITOT 0.6    Microbiology: Reviewed covid 5/31 negative covid 5/30 positive (CT value of 37) Studies/Results: CT ABDOMEN PELVIS WO CONTRAST  Result Date: 04/16/2020 CLINICAL DATA:  Left ventricular assist device. Chest site bleeding. EXAM: CT CHEST, ABDOMEN AND PELVIS WITHOUT CONTRAST TECHNIQUE: Multidetector CT imaging of the chest, abdomen and pelvis was performed following the standard protocol without IV contrast. COMPARISON:  01/25/2020 CT chest, abdomen and pelvis. FINDINGS: CT CHEST FINDINGS Cardiovascular: Mild cardiomegaly. No significant pericardial effusion/thickening. Stable position of left ventricular cyst devise with intact appearing left abdominal driveline  and outflow tract cannula anastomosed with the ascending thoracic aorta. There is a small amount of superficial fluid density in the midline chest wall anterior to the xiphoid process measured 2.7 cm width (series 3/image 49), decreased from 3.5 cm on 01/25/2020 CT. A small amount of soft tissue tracking along the proximal drive line in the subcutaneous ventral left abdominal wall (series 3/image 78) is not appreciably changed. No new focal fluid collections along the drive line or along the left ventricular assist device components. Atherosclerotic nonaneurysmal thoracic aorta. Normal caliber pulmonary arteries. Mediastinum/Nodes: No discrete thyroid nodules. Unremarkable esophagus. No pathologically enlarged axillary, mediastinal or hilar lymph nodes, noting  limited sensitivity for the detection of hilar adenopathy on this noncontrast study. No pneumomediastinum. No acute mediastinal hematoma. Lungs/Pleura: No pneumothorax. No pleural effusion. Mild hypoventilatory changes in the dependent lower lobes bilaterally. No acute consolidative airspace disease, lung masses or significant pulmonary nodules. Musculoskeletal: No aggressive appearing focal osseous lesions. Intact sternotomy wires. Symmetric mild bilateral gynecomastia, unchanged. CT ABDOMEN PELVIS FINDINGS Hepatobiliary: Normal liver with no liver mass. Normal gallbladder with no radiopaque cholelithiasis. No biliary ductal dilatation. Pancreas: Normal, with no mass or duct dilation. Spleen: Normal size. No mass. Adrenals/Urinary Tract: Normal adrenals. No renal stones. No hydronephrosis. Simple 3.1 cm anterior interpolar right renal cyst. No additional contour deforming renal lesions. Normal bladder. Stomach/Bowel: Normal non-distended stomach. Normal caliber small bowel with no small bowel wall thickening. Normal appendix. Mild left colonic diverticulosis, with no large bowel wall thickening or significant pericolonic fat stranding. Vascular/Lymphatic: Atherosclerotic nonaneurysmal abdominal aorta. No pathologically enlarged lymph nodes in the abdomen or pelvis. Reproductive: Normal size prostate with nonspecific internal prostatic calcifications. Other: No pneumoperitoneum, ascites or focal fluid collection. Musculoskeletal: No aggressive appearing focal osseous lesions. Moderate lower lumbar spondylosis. IMPRESSION: 1. No evidence of acute LVAD complication on this noncontrast scan. Small amount of chronic superficial fluid density in the midline chest wall anterior to the xiphoid process is decreased since 01/25/2020 CT. Stable small amount of soft tissue tracking along the proximal drive line in the subcutaneous ventral left abdominal wall. No new fluid collections. No acute mediastinal or intra-abdominal  hematoma. 2. Mild cardiomegaly. 3. No acute abnormality in the abdomen or pelvis. 4. Mild left colonic diverticulosis. 5. Aortic Atherosclerosis (ICD10-I70.0). Electronically Signed   By: Delbert Phenix M.D.   On: 04/16/2020 07:45   CT CHEST WO CONTRAST  Result Date: 04/16/2020 CLINICAL DATA:  Left ventricular assist device. Chest site bleeding. EXAM: CT CHEST, ABDOMEN AND PELVIS WITHOUT CONTRAST TECHNIQUE: Multidetector CT imaging of the chest, abdomen and pelvis was performed following the standard protocol without IV contrast. COMPARISON:  01/25/2020 CT chest, abdomen and pelvis. FINDINGS: CT CHEST FINDINGS Cardiovascular: Mild cardiomegaly. No significant pericardial effusion/thickening. Stable position of left ventricular cyst devise with intact appearing left abdominal driveline and outflow tract cannula anastomosed with the ascending thoracic aorta. There is a small amount of superficial fluid density in the midline chest wall anterior to the xiphoid process measured 2.7 cm width (series 3/image 49), decreased from 3.5 cm on 01/25/2020 CT. A small amount of soft tissue tracking along the proximal drive line in the subcutaneous ventral left abdominal wall (series 3/image 78) is not appreciably changed. No new focal fluid collections along the drive line or along the left ventricular assist device components. Atherosclerotic nonaneurysmal thoracic aorta. Normal caliber pulmonary arteries. Mediastinum/Nodes: No discrete thyroid nodules. Unremarkable esophagus. No pathologically enlarged axillary, mediastinal or hilar lymph nodes, noting limited sensitivity for the detection of hilar  adenopathy on this noncontrast study. No pneumomediastinum. No acute mediastinal hematoma. Lungs/Pleura: No pneumothorax. No pleural effusion. Mild hypoventilatory changes in the dependent lower lobes bilaterally. No acute consolidative airspace disease, lung masses or significant pulmonary nodules. Musculoskeletal: No aggressive  appearing focal osseous lesions. Intact sternotomy wires. Symmetric mild bilateral gynecomastia, unchanged. CT ABDOMEN PELVIS FINDINGS Hepatobiliary: Normal liver with no liver mass. Normal gallbladder with no radiopaque cholelithiasis. No biliary ductal dilatation. Pancreas: Normal, with no mass or duct dilation. Spleen: Normal size. No mass. Adrenals/Urinary Tract: Normal adrenals. No renal stones. No hydronephrosis. Simple 3.1 cm anterior interpolar right renal cyst. No additional contour deforming renal lesions. Normal bladder. Stomach/Bowel: Normal non-distended stomach. Normal caliber small bowel with no small bowel wall thickening. Normal appendix. Mild left colonic diverticulosis, with no large bowel wall thickening or significant pericolonic fat stranding. Vascular/Lymphatic: Atherosclerotic nonaneurysmal abdominal aorta. No pathologically enlarged lymph nodes in the abdomen or pelvis. Reproductive: Normal size prostate with nonspecific internal prostatic calcifications. Other: No pneumoperitoneum, ascites or focal fluid collection. Musculoskeletal: No aggressive appearing focal osseous lesions. Moderate lower lumbar spondylosis. IMPRESSION: 1. No evidence of acute LVAD complication on this noncontrast scan. Small amount of chronic superficial fluid density in the midline chest wall anterior to the xiphoid process is decreased since 01/25/2020 CT. Stable small amount of soft tissue tracking along the proximal drive line in the subcutaneous ventral left abdominal wall. No new fluid collections. No acute mediastinal or intra-abdominal hematoma. 2. Mild cardiomegaly. 3. No acute abnormality in the abdomen or pelvis. 4. Mild left colonic diverticulosis. 5. Aortic Atherosclerosis (ICD10-I70.0). Electronically Signed   By: Delbert Phenix M.D.   On: 04/16/2020 07:45   DG Chest Portable 1 View  Result Date: 04/16/2020 CLINICAL DATA:  Dyspnea EXAM: PORTABLE CHEST 1 VIEW COMPARISON:  02/01/2020 chest radiograph.  FINDINGS: Intact sternotomy wires. Partially visualized LVAD overlying the left ventricular apex. Stable cardiomediastinal silhouette with mild cardiomegaly. No pneumothorax. No pleural effusion. No overt pulmonary edema. No acute consolidative airspace disease. IMPRESSION: Stable mild cardiomegaly without overt pulmonary edema. No active pulmonary disease. Electronically Signed   By: Delbert Phenix M.D.   On: 04/16/2020 04:54     Assessment/Plan: MSSA driveline infection = continue on cefazolin. Agree with I x D   COVID+ test, recovered covid, from asymptomatic case = his initial + test from 5/30, was barely positive/non-infectious range with CT value of 37.  it is likely he may have been infected several weeks-months ago. Per hospital policy, if he has 2 negative PCR tests, he can be removed from isolation or pull off of isolation after 10 days. We will repeat tomorrow am. In terms of surgery, if repeat PCR is negative, then does not need to have airboren/contact isolation    [facility] for Infectious Diseases Cell: [phone] Pager: [phone]  04/17/2020, 3:02 PM

## 2020-04-18 ENCOUNTER — Other Ambulatory Visit (HOSPITAL_COMMUNITY): Payer: Medicare HMO

## 2020-04-18 ENCOUNTER — Inpatient Hospital Stay (HOSPITAL_COMMUNITY): Payer: Medicare HMO

## 2020-04-18 ENCOUNTER — Inpatient Hospital Stay (HOSPITAL_COMMUNITY): Payer: Medicare HMO | Admitting: Certified Registered Nurse Anesthetist

## 2020-04-18 ENCOUNTER — Encounter (HOSPITAL_COMMUNITY)
Admission: EM | Disposition: A | Discharge status: Home / Self Care | Payer: Self-pay | Source: Home / Self Care | Attending: Cardiology

## 2020-04-18 ENCOUNTER — Encounter (HOSPITAL_COMMUNITY): Payer: Self-pay | Admitting: Cardiology

## 2020-04-18 DIAGNOSIS — I5043 Acute on chronic combined systolic (congestive) and diastolic (congestive) heart failure: Secondary | ICD-10-CM

## 2020-04-18 DIAGNOSIS — T827XXA Infection and inflammatory reaction due to other cardiac and vascular devices, implants and grafts, initial encounter: Secondary | ICD-10-CM

## 2020-04-18 DIAGNOSIS — Z95811 Presence of heart assist device: Secondary | ICD-10-CM

## 2020-04-18 HISTORY — PX: WOUND EXPLORATION: SHX6188

## 2020-04-18 HISTORY — PX: APPLICATION OF WOUND VAC: SHX5189

## 2020-04-18 HISTORY — PX: WOUND DEBRIDEMENT: SHX247

## 2020-04-18 LAB — CBC
HCT: 24.9 % — ABNORMAL LOW (ref 39.0–52.0)
HCT: 39.3 % (ref 39.0–52.0)
Hemoglobin: 8.2 g/dL — ABNORMAL LOW (ref 13.0–17.0)
Hemoglobin: 13.5 g/dL (ref 13.0–17.0)
MCH: 28.9 pg (ref 26.0–34.0)
MCH: 29.9 pg (ref 26.0–34.0)
MCHC: 32.9 g/dL (ref 30.0–36.0)
MCHC: 34.4 g/dL (ref 30.0–36.0)
MCV: 87.7 fL (ref 80.0–100.0)
MCV: 87.1 fL (ref 80.0–100.0)
Platelets: 234 10*3/uL (ref 150–400)
Platelets: 118 10*3/uL — ABNORMAL LOW (ref 150–400)
RBC: 2.84 MIL/uL — ABNORMAL LOW (ref 4.22–5.81)
RBC: 4.51 MIL/uL (ref 4.22–5.81)
RDW: 14 % (ref 11.5–15.5)
RDW: 13.8 % (ref 11.5–15.5)
WBC: 8.3 10*3/uL (ref 4.0–10.5)
WBC: 18.5 10*3/uL — ABNORMAL HIGH (ref 4.0–10.5)
nRBC: 0 % (ref 0.0–0.2)
nRBC: 0 % (ref 0.0–0.2)

## 2020-04-18 LAB — SURGICAL PCR SCREEN
MRSA, PCR: NEGATIVE
Staphylococcus aureus: NEGATIVE

## 2020-04-18 LAB — GLUCOSE, CAPILLARY
Glucose-Capillary: 128 mg/dL — ABNORMAL HIGH (ref 70–99)
Glucose-Capillary: 80 mg/dL (ref 70–99)
Glucose-Capillary: 121 mg/dL — ABNORMAL HIGH (ref 70–99)

## 2020-04-18 LAB — COMPREHENSIVE METABOLIC PANEL
ALT: 13 U/L (ref 0–44)
AST: 24 U/L (ref 15–41)
Albumin: 2.3 g/dL — ABNORMAL LOW (ref 3.5–5.0)
Alkaline Phosphatase: 54 U/L (ref 38–126)
Anion gap: 9 (ref 5–15)
BUN: 11 mg/dL (ref 8–23)
CO2: 25 mmol/L (ref 22–32)
Calcium: 7.6 mg/dL — ABNORMAL LOW (ref 8.9–10.3)
Chloride: 109 mmol/L (ref 98–111)
Creatinine, Ser: 0.79 mg/dL (ref 0.61–1.24)
GFR calc Af Amer: >60 mL/min (ref >60)
GFR calc non Af Amer: >60 mL/min (ref >60)
Glucose, Bld: 80 mg/dL (ref 70–99)
Potassium: 3.9 mmol/L (ref 3.5–5.1)
Sodium: 143 mmol/L (ref 135–145)
Total Bilirubin: 1.6 mg/dL — ABNORMAL HIGH (ref 0.3–1.2)
Total Protein: 4.6 g/dL — ABNORMAL LOW (ref 6.5–8.1)

## 2020-04-18 LAB — BASIC METABOLIC PANEL
Anion gap: 15 (ref 5–15)
BUN: 11 mg/dL (ref 8–23)
CO2: 24 mmol/L (ref 22–32)
Calcium: 9.4 mg/dL (ref 8.9–10.3)
Chloride: 102 mmol/L (ref 98–111)
Creatinine, Ser: 0.86 mg/dL (ref 0.61–1.24)
GFR calc Af Amer: >60 mL/min (ref >60)
GFR calc non Af Amer: >60 mL/min (ref >60)
Glucose, Bld: 103 mg/dL — ABNORMAL HIGH (ref 70–99)
Potassium: 4.1 mmol/L (ref 3.5–5.1)
Sodium: 141 mmol/L (ref 135–145)

## 2020-04-18 LAB — BLOOD CULTURE ID PANEL (REFLEXED)

## 2020-04-18 LAB — POCT I-STAT 7, (LYTES, BLD GAS, ICA,H+H)
Acid-Base Excess: 5 mmol/L — ABNORMAL HIGH (ref 0.0–2.0)
Acid-Base Excess: 0 mmol/L (ref 0.0–2.0)
Bicarbonate: 28.8 mmol/L — ABNORMAL HIGH (ref 20.0–28.0)
Bicarbonate: 25.7 mmol/L (ref 20.0–28.0)
Calcium, Ion: 1.19 mmol/L (ref 1.15–1.40)
Calcium, Ion: 1.11 mmol/L — ABNORMAL LOW (ref 1.15–1.40)
HCT: 25 % — ABNORMAL LOW (ref 39.0–52.0)
HCT: 30 % — ABNORMAL LOW (ref 39.0–52.0)
Hemoglobin: 8.5 g/dL — ABNORMAL LOW (ref 13.0–17.0)
Hemoglobin: 10.2 g/dL — ABNORMAL LOW (ref 13.0–17.0)
O2 Saturation: 100 %
O2 Saturation: 100 %
Potassium: 4.6 mmol/L (ref 3.5–5.1)
Potassium: 4.6 mmol/L (ref 3.5–5.1)
Sodium: 138 mmol/L (ref 135–145)
Sodium: 138 mmol/L (ref 135–145)
TCO2: 30 mmol/L (ref 22–32)
TCO2: 27 mmol/L (ref 22–32)
pCO2 arterial: 40.4 mmHg (ref 32.0–48.0)
pCO2 arterial: 43.4 mmHg (ref 32.0–48.0)
pH, Arterial: 7.46 — ABNORMAL HIGH (ref 7.350–7.450)
pH, Arterial: 7.38 (ref 7.350–7.450)
pO2, Arterial: 266 mmHg — ABNORMAL HIGH (ref 83.0–108.0)
pO2, Arterial: 253 mmHg — ABNORMAL HIGH (ref 83.0–108.0)

## 2020-04-18 LAB — PROTIME-INR
INR: 1.5 — ABNORMAL HIGH (ref 0.8–1.2)
INR: 1.4 — ABNORMAL HIGH (ref 0.8–1.2)
Prothrombin Time: 17.3 seconds — ABNORMAL HIGH (ref 11.4–15.2)
Prothrombin Time: 16.9 seconds — ABNORMAL HIGH (ref 11.4–15.2)

## 2020-04-18 LAB — ECHO INTRAOPERATIVE TEE
Height: 71 in
Weight: 3026.47 oz

## 2020-04-18 LAB — PREPARE RBC (CROSSMATCH)

## 2020-04-18 LAB — MASSIVE TRANSFUSION PROTOCOL ORDER (BLOOD BANK NOTIFICATION)

## 2020-04-18 LAB — SARS CORONAVIRUS 2 BY RT PCR (HOSPITAL ORDER, PERFORMED IN ~~LOC~~ HOSPITAL LAB): SARS Coronavirus 2: POSITIVE — AB

## 2020-04-18 LAB — LACTATE DEHYDROGENASE: LDH: 140 U/L (ref 98–192)

## 2020-04-18 SURGERY — WOUND EXPLORATION
Anesthesia: General

## 2020-04-18 MED ORDER — SACUBITRIL-VALSARTAN 24-26 MG PO TABS
1.0000 | ORAL_TABLET | Freq: Two times a day (BID) | ORAL | Status: DC
  Administered 2020-04-19 (×2): 1 via ORAL
  Filled 2020-04-18 (×3): qty 1

## 2020-04-18 MED ORDER — SODIUM CHLORIDE 0.9 % IV SOLN: INTRAVENOUS | Status: DC | PRN

## 2020-04-18 MED ORDER — SODIUM CHLORIDE 0.9% FLUSH: 10.0000 mL | INTRAVENOUS | Status: DC | PRN

## 2020-04-18 MED ORDER — SODIUM CHLORIDE 0.9 % IV SOLN
INTRAVENOUS | Status: DC | PRN
  Administered 2020-04-18: 500 mL

## 2020-04-18 MED ORDER — MIDAZOLAM HCL 2 MG/2ML IJ SOLN
INTRAMUSCULAR | Status: AC
  Filled 2020-04-18: qty 2

## 2020-04-18 MED ORDER — POTASSIUM CHLORIDE 2 MEQ/ML IV SOLN
80.0000 meq | INTRAVENOUS | Status: DC
  Filled 2020-04-18: qty 40

## 2020-04-18 MED ORDER — MAGNESIUM SULFATE 50 % IJ SOLN
40.0000 meq | INTRAMUSCULAR | Status: DC
  Filled 2020-04-18: qty 9.85

## 2020-04-18 MED ORDER — LACTATED RINGERS IV SOLN
INTRAVENOUS | Status: DC | PRN
Start: 2020-04-18 — End: 2020-04-18

## 2020-04-18 MED ORDER — INSULIN REGULAR(HUMAN) IN NACL 100-0.9 UT/100ML-% IV SOLN
INTRAVENOUS | Status: AC
  Administered 2020-04-18: 2.6 [IU]/h via INTRAVENOUS
  Filled 2020-04-18: qty 100

## 2020-04-18 MED ORDER — 0.9 % SODIUM CHLORIDE (POUR BTL) OPTIME
TOPICAL | Status: DC | PRN
  Administered 2020-04-18: 2000 mL

## 2020-04-18 MED ORDER — TRANEXAMIC ACID (OHS) PUMP PRIME SOLUTION
2.0000 mg/kg | INTRAVENOUS | Status: DC
  Filled 2020-04-18: qty 1.72

## 2020-04-18 MED ORDER — FENTANYL CITRATE (PF) 100 MCG/2ML IJ SOLN: 50.0000 ug | Freq: Once | INTRAMUSCULAR | Status: DC

## 2020-04-18 MED ORDER — PROPOFOL 10 MG/ML IV BOLUS
INTRAVENOUS | Status: DC | PRN
  Administered 2020-04-18 (×2): 20 mg via INTRAVENOUS

## 2020-04-18 MED ORDER — VANCOMYCIN HCL 1000 MG IV SOLR
INTRAVENOUS | Status: DC | PRN
  Administered 2020-04-18: 1000 mL

## 2020-04-18 MED ORDER — SODIUM CHLORIDE (PF) 0.9 % IJ SOLN
OROMUCOSAL | Status: DC | PRN
  Administered 2020-04-18 (×2): 4 mL via TOPICAL

## 2020-04-18 MED ORDER — DEXMEDETOMIDINE HCL IN NACL 400 MCG/100ML IV SOLN
0.4000 ug/kg/h | INTRAVENOUS | Status: DC
  Administered 2020-04-18: 0.9 ug/kg/h via INTRAVENOUS
  Administered 2020-04-18 – 2020-04-19 (×2): 1.1 ug/kg/h via INTRAVENOUS
  Filled 2020-04-18 (×2): qty 100

## 2020-04-18 MED ORDER — CALCIUM CHLORIDE 10 % IV SOLN
1.0000 g | Freq: Once | INTRAVENOUS | Status: AC
  Administered 2020-04-18: 1 g via INTRAVENOUS

## 2020-04-18 MED ORDER — SODIUM CHLORIDE 0.9 % IV SOLN
INTRAVENOUS | Status: DC
  Filled 2020-04-18: qty 30

## 2020-04-18 MED ORDER — TRANEXAMIC ACID (OHS) BOLUS VIA INFUSION
15.0000 mg/kg | INTRAVENOUS | Status: AC
  Administered 2020-04-18: 1287 mg via INTRAVENOUS
  Filled 2020-04-18: qty 1287

## 2020-04-18 MED ORDER — SODIUM CHLORIDE 0.9% IV SOLUTION: Freq: Once | INTRAVENOUS | Status: DC

## 2020-04-18 MED ORDER — ALBUMIN HUMAN 25 % IV SOLN: 50.0000 g | Freq: Once | INTRAVENOUS | Status: DC

## 2020-04-18 MED ORDER — PHENYLEPHRINE HCL-NACL 20-0.9 MG/250ML-% IV SOLN
30.0000 ug/min | INTRAVENOUS | Status: DC
  Filled 2020-04-18: qty 250

## 2020-04-18 MED ORDER — HEPARIN SODIUM (PORCINE) 1000 UNIT/ML IJ SOLN
INTRAMUSCULAR | Status: DC | PRN
  Administered 2020-04-18: 28000 [IU] via INTRAVENOUS

## 2020-04-18 MED ORDER — PHENYLEPHRINE HCL-NACL 10-0.9 MG/250ML-% IV SOLN: 30.0000 ug/min | INTRAVENOUS | Status: DC

## 2020-04-18 MED ORDER — FENTANYL 2500MCG IN NS 250ML (10MCG/ML) PREMIX INFUSION: 50.0000 ug/h | INTRAVENOUS | Status: DC

## 2020-04-18 MED ORDER — NOREPINEPHRINE 4 MG/250ML-% IV SOLN
INTRAVENOUS | Status: DC | PRN
  Administered 2020-04-18: 2 ug/min via INTRAVENOUS

## 2020-04-18 MED ORDER — SODIUM CHLORIDE 0.9 % IV SOLN
INTRAVENOUS | Status: AC
  Filled 2020-04-18: qty 1.2

## 2020-04-18 MED ORDER — HEMOSTATIC AGENTS (NO CHARGE) OPTIME
TOPICAL | Status: DC | PRN
  Administered 2020-04-18 (×3): 1 via TOPICAL

## 2020-04-18 MED ORDER — VANCOMYCIN HCL 1500 MG/300ML IV SOLN
1500.0000 mg | INTRAVENOUS | Status: DC
  Filled 2020-04-18: qty 300

## 2020-04-18 MED ORDER — TRANEXAMIC ACID 1000 MG/10ML IV SOLN
1.5000 mg/kg/h | INTRAVENOUS | Status: AC
  Administered 2020-04-18: 1.5 mg/kg/h via INTRAVENOUS
  Filled 2020-04-18: qty 25

## 2020-04-18 MED ORDER — FENTANYL CITRATE (PF) 250 MCG/5ML IJ SOLN
INTRAMUSCULAR | Status: AC
  Filled 2020-04-18: qty 5

## 2020-04-18 MED ORDER — DEXMEDETOMIDINE HCL IN NACL 400 MCG/100ML IV SOLN
0.1000 ug/kg/h | INTRAVENOUS | Status: DC
  Filled 2020-04-18: qty 100

## 2020-04-18 MED ORDER — SODIUM CHLORIDE 0.9% FLUSH
10.0000 mL | Freq: Two times a day (BID) | INTRAVENOUS | Status: DC
  Administered 2020-04-18: 10 mL

## 2020-04-18 MED ORDER — ORAL CARE MOUTH RINSE
15.0000 mL | OROMUCOSAL | Status: DC
  Administered 2020-04-18 – 2020-04-19 (×6): 15 mL via OROMUCOSAL

## 2020-04-18 MED ORDER — HYDRALAZINE HCL 20 MG/ML IJ SOLN
10.0000 mg | Freq: Four times a day (QID) | INTRAMUSCULAR | Status: DC | PRN
  Administered 2020-05-01: 10 mg via INTRAVENOUS
  Filled 2020-04-18: qty 1

## 2020-04-18 MED ORDER — LACTATED RINGERS IV SOLN: INTRAVENOUS | Status: DC | PRN

## 2020-04-18 MED ORDER — NOREPINEPHRINE 4 MG/250ML-% IV SOLN
0.0000 ug/min | INTRAVENOUS | Status: DC
  Administered 2020-04-18: 3 ug/min via INTRAVENOUS
  Administered 2020-04-19: 6 ug/min via INTRAVENOUS
  Filled 2020-04-18: qty 250

## 2020-04-18 MED ORDER — NITROGLYCERIN IN D5W 200-5 MCG/ML-% IV SOLN
2.0000 ug/min | INTRAVENOUS | Status: DC
  Filled 2020-04-18: qty 250

## 2020-04-18 MED ORDER — ETOMIDATE 2 MG/ML IV SOLN
INTRAVENOUS | Status: AC
  Filled 2020-04-18: qty 10

## 2020-04-18 MED ORDER — FENTANYL 2500MCG IN NS 250ML (10MCG/ML) PREMIX INFUSION
0.0000 ug/h | INTRAVENOUS | Status: DC
  Administered 2020-04-18: 100 ug/h via INTRAVENOUS
  Administered 2020-04-19: 200 ug/h via INTRAVENOUS
  Filled 2020-04-18 (×2): qty 250

## 2020-04-18 MED ORDER — ETOMIDATE 2 MG/ML IV SOLN
INTRAVENOUS | Status: DC | PRN
  Administered 2020-04-18: 20 mg via INTRAVENOUS

## 2020-04-18 MED ORDER — PLASMA-LYTE 148 IV SOLN
INTRAVENOUS | Status: DC
  Filled 2020-04-18: qty 2.5

## 2020-04-18 MED ORDER — SODIUM CHLORIDE 0.9 % IV SOLN
1.0000 g | Freq: Once | INTRAVENOUS | Status: DC
  Filled 2020-04-18: qty 10

## 2020-04-18 MED ORDER — ALBUMIN HUMAN 5 % IV SOLN
12.5000 g | Freq: Once | INTRAVENOUS | Status: AC
  Administered 2020-04-18: 12.5 g via INTRAVENOUS

## 2020-04-18 MED ORDER — PROTAMINE SULFATE 10 MG/ML IV SOLN
INTRAVENOUS | Status: DC | PRN
  Administered 2020-04-18: 280 mg via INTRAVENOUS

## 2020-04-18 MED ORDER — MIDAZOLAM HCL 2 MG/2ML IJ SOLN
INTRAMUSCULAR | Status: AC
  Filled 2020-04-18: qty 4

## 2020-04-18 MED ORDER — MILRINONE LACTATE IN DEXTROSE 20-5 MG/100ML-% IV SOLN
0.3000 ug/kg/min | INTRAVENOUS | Status: DC
  Filled 2020-04-18: qty 100

## 2020-04-18 MED ORDER — DEXMEDETOMIDINE HCL IN NACL 400 MCG/100ML IV SOLN
0.1000 ug/kg/h | INTRAVENOUS | Status: AC
  Administered 2020-04-18: .4 ug/kg/h via INTRAVENOUS
  Filled 2020-04-18: qty 100

## 2020-04-18 MED ORDER — FENTANYL BOLUS VIA INFUSION
50.0000 ug | INTRAVENOUS | Status: DC | PRN
  Filled 2020-04-18: qty 50

## 2020-04-18 MED ORDER — INSULIN REGULAR(HUMAN) IN NACL 100-0.9 UT/100ML-% IV SOLN
INTRAVENOUS | Status: DC
  Filled 2020-04-18: qty 100

## 2020-04-18 MED ORDER — LIDOCAINE 2% (20 MG/ML) 5 ML SYRINGE
INTRAMUSCULAR | Status: DC | PRN
  Administered 2020-04-18: 60 mg via INTRAVENOUS

## 2020-04-18 MED ORDER — PROPOFOL 10 MG/ML IV BOLUS
INTRAVENOUS | Status: AC
  Filled 2020-04-18: qty 20

## 2020-04-18 MED ORDER — MIDAZOLAM HCL 5 MG/5ML IJ SOLN
INTRAMUSCULAR | Status: DC | PRN
  Administered 2020-04-18: 1 mg via INTRAVENOUS
  Administered 2020-04-18: 2 mg via INTRAVENOUS
  Administered 2020-04-18: 1 mg via INTRAVENOUS

## 2020-04-18 MED ORDER — GABAPENTIN 250 MG/5ML PO SOLN
600.0000 mg | Freq: Four times a day (QID) | ORAL | Status: AC
  Administered 2020-04-19: 600 mg
  Filled 2020-04-18 (×3): qty 12

## 2020-04-18 MED ORDER — CEFAZOLIN SODIUM-DEXTROSE 2-3 GM-%(50ML) IV SOLR
INTRAVENOUS | Status: DC | PRN
  Administered 2020-04-18: 2 g via INTRAVENOUS

## 2020-04-18 MED ORDER — FENTANYL CITRATE (PF) 250 MCG/5ML IJ SOLN
INTRAMUSCULAR | Status: DC | PRN
  Administered 2020-04-18: 100 ug via INTRAVENOUS
  Administered 2020-04-18: 50 ug via INTRAVENOUS
  Administered 2020-04-18: 75 ug via INTRAVENOUS
  Administered 2020-04-18: 25 ug via INTRAVENOUS
  Administered 2020-04-18: 50 ug via INTRAVENOUS
  Administered 2020-04-18: 100 ug via INTRAVENOUS
  Administered 2020-04-18: 50 ug via INTRAVENOUS
  Administered 2020-04-18 (×3): 100 ug via INTRAVENOUS

## 2020-04-18 MED ORDER — SUCCINYLCHOLINE CHLORIDE 200 MG/10ML IV SOSY
PREFILLED_SYRINGE | INTRAVENOUS | Status: DC | PRN
  Administered 2020-04-18: 100 mg via INTRAVENOUS

## 2020-04-18 MED ORDER — ROCURONIUM BROMIDE 10 MG/ML (PF) SYRINGE
PREFILLED_SYRINGE | INTRAVENOUS | Status: DC | PRN
  Administered 2020-04-18: 50 mg via INTRAVENOUS
  Administered 2020-04-18: 30 mg via INTRAVENOUS
  Administered 2020-04-18: 40 mg via INTRAVENOUS
  Administered 2020-04-18: 30 mg via INTRAVENOUS
  Administered 2020-04-18 (×2): 50 mg via INTRAVENOUS

## 2020-04-18 SURGICAL SUPPLY — 90 items
BAG DECANTER FOR FLEXI CONT (MISCELLANEOUS) ×2 IMPLANT
BANDAGE HEMOSTAT MRDH 4X4 STRL (MISCELLANEOUS) IMPLANT
BLADE CLIPPER SURG (BLADE) ×2 IMPLANT
BLADE CORE FAN STRYKER (BLADE) ×2 IMPLANT
BLADE SURG SZ11 CARB STEEL (BLADE) ×2 IMPLANT
BNDG HEMOSTAT MRDH 4X4 STRL (MISCELLANEOUS) ×4
CANISTER SUCT 3000ML PPV (MISCELLANEOUS) ×4 IMPLANT
CANISTER WOUND CARE 500ML ATS (WOUND CARE) ×2 IMPLANT
CANNULA OPTISITE PERFUSION 18F (CANNULA) ×2 IMPLANT
CATH CPB KIT VANTRIGT (MISCELLANEOUS) ×2 IMPLANT
CLIP VESOCCLUDE MED 24/CT (CLIP) ×2 IMPLANT
CLIP VESOCCLUDE MED 6/CT (CLIP) ×2 IMPLANT
CLIP VESOCCLUDE SM WIDE 6/CT (CLIP) ×2 IMPLANT
COVER SURGICAL LIGHT HANDLE (MISCELLANEOUS) ×4 IMPLANT
DERMABOND ADVANCED (GAUZE/BANDAGES/DRESSINGS) ×2
DERMABOND ADVANCED .7 DNX12 (GAUZE/BANDAGES/DRESSINGS) IMPLANT
DRAPE CHEST BREAST 15X10 FENES (DRAPES) ×2 IMPLANT
DRAPE INCISE IOBAN 66X45 STRL (DRAPES) ×4 IMPLANT
DRSG AQUACEL AG ADV 3.5X14 (GAUZE/BANDAGES/DRESSINGS) ×2 IMPLANT
DRSG PAD ABDOMINAL 8X10 ST (GAUZE/BANDAGES/DRESSINGS) ×2 IMPLANT
DRSG VAC ATS MED SENSATRAC (GAUZE/BANDAGES/DRESSINGS) ×2 IMPLANT
DRSG VERSA FOAM LRG 10X15 (GAUZE/BANDAGES/DRESSINGS) ×2 IMPLANT
ELECT BLADE 4.0 EZ CLEAN MEGAD (MISCELLANEOUS) ×4
ELECT KIT SAFEOP EMG/NMJ (KITS) ×4
ELECT REM PT RETURN 9FT ADLT (ELECTROSURGICAL) ×4
ELECTRODE BLDE 4.0 EZ CLN MEGD (MISCELLANEOUS) IMPLANT
ELECTRODE REM PT RTRN 9FT ADLT (ELECTROSURGICAL) ×2 IMPLANT
FELT TEFLON 1X6 (MISCELLANEOUS) ×4 IMPLANT
FEMORAL VENOUS CANN RAP (CANNULA) ×2 IMPLANT
GAUZE SPONGE 4X4 12PLY STRL (GAUZE/BANDAGES/DRESSINGS) ×6 IMPLANT
GLOVE BIO SURGEON STRL SZ 6.5 (GLOVE) ×6 IMPLANT
GLOVE BIO SURGEON STRL SZ7.5 (GLOVE) ×6 IMPLANT
GLOVE BIO SURGEONS STRL SZ 6.5 (GLOVE) ×6
GLOVE BIOGEL PI IND STRL 6 (GLOVE) IMPLANT
GLOVE BIOGEL PI IND STRL 6.5 (GLOVE) IMPLANT
GLOVE BIOGEL PI INDICATOR 6 (GLOVE) ×4
GLOVE BIOGEL PI INDICATOR 6.5 (GLOVE) ×4
GLOVE SURG SIGNA 7.5 PF LTX (GLOVE) ×2 IMPLANT
GOWN STRL REUS W/ TWL LRG LVL3 (GOWN DISPOSABLE) ×4 IMPLANT
GOWN STRL REUS W/TWL LRG LVL3 (GOWN DISPOSABLE) ×16
HEMOSTAT POWDER SURGIFOAM 1G (HEMOSTASIS) ×4 IMPLANT
KIT BASIN OR (CUSTOM PROCEDURE TRAY) ×4 IMPLANT
KIT DILATOR VASC 18G NDL (KITS) ×2 IMPLANT
KIT DRAINAGE VACCUM ASSIST (KITS) ×2 IMPLANT
KIT EMG SRFC ELECT (KITS) IMPLANT
KIT SUCTION CATH 14FR (SUCTIONS) ×2 IMPLANT
KIT TURNOVER KIT B (KITS) ×4 IMPLANT
LINE VENT (MISCELLANEOUS) ×2 IMPLANT
NS IRRIG 1000ML POUR BTL (IV SOLUTION) ×6 IMPLANT
PACK CHEST (CUSTOM PROCEDURE TRAY) ×2 IMPLANT
PACK GENERAL/GYN (CUSTOM PROCEDURE TRAY) ×2 IMPLANT
PAD ARMBOARD 7.5X6 YLW CONV (MISCELLANEOUS) ×8 IMPLANT
SEALANT SURG COSEAL 8ML (VASCULAR PRODUCTS) ×2 IMPLANT
SET CANNULATION TOURNIQUET (MISCELLANEOUS) ×2 IMPLANT
SET CARDIOPLEGIA MPS 5001102 (MISCELLANEOUS) ×2 IMPLANT
SET MICROPUNCTURE 5F STIFF (MISCELLANEOUS) ×2 IMPLANT
SHEATH PINNACLE 5F 10CM (SHEATH) ×4 IMPLANT
SPONGE LAP 18X18 RF (DISPOSABLE) ×8 IMPLANT
SPONGE LAP 4X18 RFD (DISPOSABLE) ×4 IMPLANT
SURGIFLO W/THROMBIN 8M KIT (HEMOSTASIS) ×2 IMPLANT
SUT ETHIBOND 2 0 SH (SUTURE) ×8
SUT ETHIBOND 2 0 SH 36X2 (SUTURE) IMPLANT
SUT PROLENE 3 0 SH DA (SUTURE) ×4 IMPLANT
SUT PROLENE 3 0 SH1 36 (SUTURE) ×4 IMPLANT
SUT PROLENE 4 0 RB 1 (SUTURE) ×24
SUT PROLENE 4-0 RB1 .5 CRCL 36 (SUTURE) IMPLANT
SUT PROLENE 5 0 C 1 36 (SUTURE) ×4 IMPLANT
SUT SILK  1 MH (SUTURE) ×8
SUT SILK 1 MH (SUTURE) IMPLANT
SUT SILK 1 TIES 10X30 (SUTURE) ×2 IMPLANT
SUT SILK 2 0 SH CR/8 (SUTURE) ×2 IMPLANT
SUT VIC AB 1 CT1 27 (SUTURE) ×4
SUT VIC AB 1 CT1 27XBRD ANBCTR (SUTURE) IMPLANT
SUT VIC AB 1 CTX 18 (SUTURE) ×2 IMPLANT
SUT VIC AB 2-0 CTX 27 (SUTURE) ×2 IMPLANT
SUT VIC AB 3-0 X1 27 (SUTURE) ×4 IMPLANT
SWAB COLLECTION DEVICE MRSA (MISCELLANEOUS) ×4 IMPLANT
SWAB CULTURE ESWAB REG 1ML (MISCELLANEOUS) ×4 IMPLANT
SYR 10ML LL (SYRINGE) ×2 IMPLANT
SYR 20ML LL LF (SYRINGE) ×8 IMPLANT
TAPE CLOTH SURG 4X10 WHT LF (GAUZE/BANDAGES/DRESSINGS) ×4 IMPLANT
TAPE UMBILICAL COTTON 1/8X30 (MISCELLANEOUS) ×2 IMPLANT
TOWEL GREEN STERILE (TOWEL DISPOSABLE) ×4 IMPLANT
TOWEL GREEN STERILE FF (TOWEL DISPOSABLE) ×6 IMPLANT
TRAY FOLEY SLVR 16FR TEMP STAT (SET/KITS/TRAYS/PACK) ×2 IMPLANT
TUBE CONNECTING 20'X1/4 (TUBING) ×1
TUBE CONNECTING 20X1/4 (TUBING) ×1 IMPLANT
WATER STERILE IRR 1000ML POUR (IV SOLUTION) ×4 IMPLANT
WIRE TORQFLEX AUST .018X40CM (WIRE) ×2 IMPLANT
YANKAUER SUCT BULB TIP NO VENT (SUCTIONS) ×2 IMPLANT

## 2020-04-18 NOTE — Transfer of Care (Signed)
Immediate Anesthesia Transfer of Care Note  Patient: Theodore Welch  Procedure(s) Performed: REPAIR OF LVAD (N/A ) Debridement Abdominal Wound Application Of Wound Vac (N/A )  Patient Location: ICU  Anesthesia Type:General  Level of Consciousness: sedated and Patient remains intubated per anesthesia plan  Airway & Oxygen Therapy: Patient remains intubated per anesthesia plan and Patient placed on Ventilator (see vital sign flow sheet for setting)  Post-op Assessment: Report given to RN and Post -op Vital signs reviewed and stable abp 108/90; HR 80 spo2 100%  Post vital signs: Reviewed and stable  Last Vitals:  Vitals Value Taken Time  BP    Temp    Pulse 78 04/18/20 1647  Resp 20 04/18/20 1647  SpO2 100 % 04/18/20 1647  Vitals shown include unvalidated device data.  Last Pain:  Vitals:   04/18/20 0904  TempSrc: Oral  PainSc:       Patients Stated Pain Goal: 0 (04/17/20 1045)  Complications: No apparent anesthesia complications

## 2020-04-18 NOTE — Op Note (Signed)
NAME: Theodore Welch, Theodore Welch MEDICAL RECORD EL:[id number] ACCOUNT [id number] DATE OF BIRTH:20-Apr-1961 FACILITY: [facility] LOCATION: [facility] PHYSICIAN:Waqas Bruhl VAN TRIGT III, MD  OPERATIVE REPORT  DATE OF PROCEDURE:  04/18/2020  SURGEON:  Kerin Perna, MD  ASSISTANT:  Doree Fudge, PA-C.  OPERATIONS: 1. Cardiopulmonary Bypass for  exploration of upper abdominal and lower chest wound from infected ventricular assist device power cord tunnel. 2.  Partial sternotomy for exposure of the ventricular assist device outflow graft bleeding. 3.  Repair of bleeding of ventricular assist device outflow graft with primary suture repair 4.  Irrigation and sharp debridement of chronically infected indolent upper abdominal wound and ventricular assist device driveline tunnel. 5.  Placement of wound VAC.  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  DESCRIPTION OF PROCEDURE:  After informed consent was obtained and final questions addressed with both the patient and his wife, the patient was brought to the operating room by the OR team with isolation precautions because of the patient's COVID  positive status.  The patient was placed supine on the operating table and general anesthesia was achieved.  A radial A-line and a central line were placed by the anesthesia team.  Later, a transesophageal echo probe was placed by the anesthesia team.  The patient's previous chest dressing was removed and the chest, abdomen and legs to the knees were prepped and draped as a sterile field.  A proper time-out was performed.  The indurated tissue around the subxiphoid wound was excised sharply.  The dissection was carried down to the rectus sheath and to the xiphoid.  The xiphoid process  was partially debrided.  The wound had been cultured and the indolent, indurated,  infected tissue was sharply excised.  The lower coating of the power cord was encountered and this was carefully removed.  The space deep to the power cord contained the outflow graft and during the dissection and irrigation of the wound,  this started significantly bleeding.  Visualization  was impossible in the deep wound, so pressure was applied as both groins were accessed for fem-fem cardiopulmonary bypass.  An 18-French arterial cannula was placed in the femoral artery through a pursestring of 5-0 Prolene and a 23-25 French cannula was  placed in the right femoral vein and advanced to the right atrium over a guidewire.  The patient was heparinized and placed on cardiopulmonary bypass as the HeartMate 3 speed and flow were reduced down and turned off.  After going on bypass and using the cardiopulmonary bypass suction catheters and further improving exposure with partial lower sternotomy and removal of a lower sternal wire, the outflow graft could be more easily visualized.  Two bleeding points were  identified, probably related to the infection.  The graft itself had no purulence or fibrinous exudate or membrane.  Prolene mattress sutures were placed carefully with 4.0 needle repair the bleeding sites.  The wound was then irrigated with vancomycin  irrigation.  Topical hemostatic agents including MRDH were applied and hemostasis was adequate.   The ventilator was resumed.  The patient was then weaned off cardiopulmonary bypass and then placed back on HeartMate 3 support without any difficulty in  hemodynamics.  The patient remained in sinus rhythm with minimal inotropic support required.  After weaning off bypass, the patient was given protamine without adverse reaction.  The venous cannula was removed from the right femoral vein and the groin  was held for 30 minutes.  The left femoral artery cannula was removed and  the pursestrings tied.  Both groins were irrigated and closed in a standard fashion.  The opened infected lower chest and upper abdominal wound was then closed with wound VAC sponges using a white sponge for the deep tissue and a black sponge on top.  These were covered with sterile wound VAC sheets and the suction catheter was applied to  the sponges, which compressed normally.  The patient was then returned to the ICU after chest x-ray showed good placement of lines and no pneumothorax.  The patient transported to the ICU in stable condition.  Total cardiopulmonary bypass time was 57  minutes.  VN/NUANCE  D:04/18/2020 T:04/18/2020 JOB:[id number]

## 2020-04-18 NOTE — Anesthesia Procedure Notes (Signed)
Procedure Name: Intubation Date/Time: 04/18/2020 11:22 AM Performed by: Wilburn Cornelia, CRNA Pre-anesthesia Checklist: Patient identified, Emergency Drugs available, Suction available, Patient being monitored and Timeout performed Patient Re-evaluated:Patient Re-evaluated prior to induction Oxygen Delivery Method: Circle system utilized Preoxygenation: Pre-oxygenation with 100% oxygen Induction Type: IV induction and Rapid sequence Laryngoscope Size: Mac and 4 Grade View: Grade II Tube type: Oral Tube size: 7.5 mm Number of attempts: 1 Airway Equipment and Method: Stylet Placement Confirmation: ETT inserted through vocal cords under direct vision,  positive ETCO2,  CO2 detector and breath sounds checked- equal and bilateral Secured at: 23 cm Tube secured with: Tape Dental Injury: Teeth and Oropharynx as per pre-operative assessment

## 2020-04-18 NOTE — Plan of Care (Signed)
  Problem: Education: Goal: Knowledge of General Education information will improve Description: Including pain rating scale, medication(s)/side effects and non-pharmacologic comfort measures Outcome: Progressing   Problem: Health Behavior/Discharge Planning: Goal: Ability to manage health-related needs will improve Outcome: Progressing   Problem: Clinical Measurements: Goal: Ability to maintain clinical measurements within normal limits will improve Outcome: Progressing Goal: Will remain free from infection Outcome: Progressing   Problem: Activity: Goal: Risk for activity intolerance will decrease Outcome: Progressing   Problem: Coping: Goal: Level of anxiety will decrease Outcome: Progressing   Problem: Elimination: Goal: Will not experience complications related to bowel motility Outcome: Completed/Met Goal: Will not experience complications related to urinary retention Outcome: Progressing   Problem: Elimination: Goal: Will not experience complications related to urinary retention Outcome: Progressing

## 2020-04-18 NOTE — Progress Notes (Signed)
RT NOTES: Transported patient on vent from OR to room 2H23 without incident. Report given to 2H RT.

## 2020-04-18 NOTE — Progress Notes (Signed)
ID PROGRESS NOTE  Day #4:cefazolin   24hr event: blood cx + MSSA, received FFP and RBC for surgery, underwent Ix D of subxiphoid abscess and chest wound , repair of vad outflow graft, now with wound vac. Repeat covid test + (barely, with CT value of 39.7, cutoff of 40 as negative)  Patient in PACU  Plan:  1.disseminated MSSA VAD driveline/subxiphoid abscess with secondary bacteremia = continue on cefazolin. Repeat blood cx tomorrow to document clearance. Plan for 6-8wk given burden of disease, with chronic suppression.  2. Asymptomatic covid disease= his CT values are just barely over the threshold of detection, not in the infectious range. Suspect he had infection > 2  Weeks ago.  Recommend to keep on airborne/contact for the next 5 days. Repeat covid test, if negative, then remove off of isolation  Will provide further recs tomorrow.

## 2020-04-18 NOTE — Progress Notes (Addendum)
Advanced Heart Failure VAD Team Note  PCP-Cardiologist: No primary care provider on file.   Subjective:    5/31 Acute blood loss sternal wound. Hgb down to 7.4. Received 1 unit PRBCs yesterday. Hgb 7.4>>9.7>>8.2. Going to OR today.   Blood cultures 1/2 positive for MSSA. ID following. On cefazolin. AF.  WBC 15>>8.3.   Feels ok this morning. No complaints. Denies dyspnea.   LVAD INTERROGATION:  HeartMate III LVAD:   Flow 4.4  liters/min, speed 5700, power 4.1 PI 4.4  Multiple PI events. No low flows   Objective:    Vital Signs:   Temp:  [99 F (37.2 C)-99.4 F (37.4 C)] 99.3 F (37.4 C) (06/01 0000) Pulse Rate:  [74-92] 80 (06/01 0800) Resp:  [14-28] 26 (06/01 0800) BP: (89-118)/(73-97) 104/92 (06/01 0800) SpO2:  [93 %-100 %] 100 % (06/01 0800) Weight:  [85.8 kg] 85.8 kg (06/01 0600) Last BM Date: (PTA) Mean arterial Pressure 80-90s  Intake/Output:   Intake/Output Summary (Last 24 hours) at 04/18/2020 0834 Last data filed at 04/18/2020 0400 Gross per 24 hour  Intake 924 ml  Output 3060 ml  Net -2136 ml     Physical Exam    General:  Well appearing, sitting up in bed. No resp difficulty HEENT: normal Neck: supple. JVP flat  . Carotids 2+ bilat; no bruits. No lymphadenopathy or thyromegaly appreciated. Cor: Mechanical heart sounds with LVAD hum present. Sternal wound dressing dry and intact   Lungs: clear Abdomen: soft, nontender, nondistended. No hepatosplenomegaly. No bruits or masses. Good bowel sounds. Driveline: C/D/I; securement device intact and driveline incorporated, bandaged. No visible drainage.  Extremities: no cyanosis, clubbing, rash, edema Neuro: alert & orientedx3, cranial nerves grossly intact. moves all 4 extremities w/o difficulty. Affect pleasant   Telemetry   NSR with PVCs 80-90s   EKG   n/a   Labs   Basic Metabolic Panel: Recent Labs  Lab 04/16/20 0408 04/17/20 0059 04/18/20 0401  NA 137 135 141  K 4.8 4.4 4.1  CL 105 102 102    CO2 21* 19* 24  GLUCOSE 178* 151* 103*  BUN 12 13 11   CREATININE 1.01 1.11 0.86  CALCIUM 9.3 9.0 9.4    Liver Function Tests: Recent Labs  Lab 04/16/20 0408  AST 19  ALT 14  ALKPHOS 105  BILITOT 0.6  PROT 7.1  ALBUMIN 3.4*   No results for input(s): LIPASE, AMYLASE in the last 168 hours. No results for input(s): AMMONIA in the last 168 hours.  CBC: Recent Labs  Lab 04/16/20 0408 04/17/20 0059 04/17/20 0546 04/18/20 0401  WBC 16.6* 15.0* 15.3* 8.3  NEUTROABS 10.9*  --   --   --   HGB 10.5* 7.4* 9.7* 8.2*  HCT 33.6* 23.4* 29.3* 24.9*  MCV 92.3 91.8 88.8 87.7  PLT 424* 268 262 234    INR: Recent Labs  Lab 04/16/20 0408 04/17/20 0059 04/18/20 0401  INR 2.5* 1.9* 1.5*    Other results:  EKG:    Imaging   No results found.   Medications:     Scheduled Medications: . gabapentin  600 mg Oral QID  . insulin aspart  0-15 Units Subcutaneous TID WC  . insulin aspart  0-5 Units Subcutaneous QHS  . mupirocin cream   Topical BID  . pantoprazole  40 mg Oral Daily  . thiamine  100 mg Oral Daily  . varenicline  1 mg Oral Daily  . vitamin B-12  1,000 mcg Oral Daily  . zinc sulfate  220 mg Oral Daily    Infusions: .  ceFAZolin (ANCEF) IV 2 g (04/18/20 0626)  . 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 He previously required debridement of lower sternalwound with woundVAC therapy and IV antibiotics.This was completed with 3 surgical procedures including debridement and ACell application in March 2021.  More recently, the lower sternal wound reopened with drainage again culturing MSSA and he had been on cephalexin + doxycycline.  The day of admission, he developed persistent  bleeding from the wound site and came to ER, bleeding now stopped with suture.  Bleeding again 5/30, now stopped again. Got an additional 1 unit PRBCs on 5/31. Hgb 7.4 => 9.7=> 8.2.  Additional 1 unit PRBCs now ordered.  - Overall he has received 2 units PRBCs + FFP.  - ID consulted and switched antibiotics to cefazolin.  1/2 blood cultures + for MSSA - CT chest/abd/pelvis--> Stable small amount of soft tissue tracking along the proximal drive line in the subcutaneous ventral left abdominal wall.  -INR down 1.5  - Return to OR today  2. COVID-19 infection: Asymptomatic with no fever, no cough/dyspnea.  WBCS 16.6-->15.2->8.3 - Ferritin normal, D dimer 0.96.  LDH stable 140. - Discussed monoclonal antibody infusion with Dr. Kendrick Fries => hold off for now as not clear that he is actively infected.   - Per ID, maintain airborne precautions. Repeat COVID Test 6/1 positive.   3. 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 LVAD parameters stable.  Has been NYHA class II. He does not look volume overloaded.  MAP low initially, started on phenylephrine (may have been at least partly vagal as very diaphoretic with bleeding).   - Maps 80-90s, now off phenylephrine.  - Volume status stable.  - He does not need Lasix.   - For now, will hold spironolactone, Entresto, amlodipine.  - Hold ASA and warfarin with bleeding.  4. Smoking:He has cut back a lot, still taking Chantix. Encouraged to quit. 5. H/o RLE DVT: On warfarin for LVAD but held for wound exploration 6. OSA: Continue CPAP.  7. Hyperlipidemia: Atorvastatin.  8. Type II diabetes:SSI 9. Anemia, Acute blood loss: Hgb down to 8.2 today. Transfuse w/ 1 unit today - Follow CBC, repeat in afternoon.    I reviewed the LVAD parameters from today, and compared the results to the patient's prior recorded data.  No programming changes were made.  The LVAD is functioning within specified parameters.  The patient  performs LVAD self-test daily.  LVAD interrogation was negative for any significant power changes, alarms or PI events/speed drops.  LVAD equipment check completed and is in good working order.  Back-up equipment present.   LVAD education done on emergency procedures and precautions and reviewed exit site care.  Length of Stay: 2  Robbie Lis, PA-C 04/18/2020, 8:34 AM  VAD Team --- VAD ISSUES ONLY--- Pager [phone] (7am - 7am)  Advanced Heart Failure Team  Pager [phone] (M-F; 7a - 4p)  Please contact [facility] for night-coverage after hours (4p -7a ) and weekends on [email or web address]  Patient seen with PA, agree with the above note.  Hgb back down to 8.4 today.  1/2 MSSA in blood.  Repeat COVID-19 test was positive. MAP generally 90s.   General: Well  appearing this am. NAD.  HEENT: Normal. Neck: Supple, JVP 7-8 cm. Carotids OK.  Cardiac:  Mechanical heart sounds with LVAD hum present.  Lungs:  CTAB, normal effort.  Abdomen:  NT, ND, no HSM. No bruits or masses. +BS  LVAD exit site: Covered with dressing.  Extremities:  Warm and dry. No cyanosis, clubbing, rash, or edema.  Neuro:  Alert & oriented x 3. Cranial nerves grossly intact. Moves all 4 extremities w/o difficulty. Affect pleasant    To OR today for debridement of sternal wound.    On cefazolin for MSSA in blood cultures, ID following.   Continue COVID-19 precautions, he is asymptomatic from standpoint of COVID.   Restart Entresto at 24/26 bid after surgery with elevated MAP.     Hgb lower again today, with active bleeding will give 1 unit PRBCs.   INR 1.5 today, restart warfarin after surgery, timing per Dr. Prescott Gum.   Loralie Champagne 04/18/2020 9:26 AM

## 2020-04-18 NOTE — Anesthesia Procedure Notes (Signed)
Central Venous Catheter Insertion Performed by: Marcene Duos, MD, anesthesiologist Start/End6/11/2019 11:22 AM, 04/18/2020 11:35 AM Patient location: Pre-op. Preanesthetic checklist: patient identified, IV checked, site marked, risks and benefits discussed, surgical consent, monitors and equipment checked, pre-op evaluation, timeout performed and anesthesia consent Position: Trendelenburg Lidocaine 1% used for infiltration and patient sedated Hand hygiene performed , maximum sterile barriers used  and Seldinger technique used Catheter size: 8 Fr Total catheter length 16. Central line was placed.Double lumen Procedure performed using ultrasound guided technique. Ultrasound Notes:anatomy identified, needle tip was noted to be adjacent to the nerve/plexus identified, no ultrasound evidence of intravascular and/or intraneural injection and image(s) printed for medical record Attempts: 1 Following insertion, dressing applied, line sutured and Biopatch. Post procedure assessment: blood return through all ports and free fluid flow  Patient tolerated the procedure well with no immediate complications.

## 2020-04-18 NOTE — Progress Notes (Signed)
Pre Procedure note for inpatients:   Theodore Welch has been scheduled for Procedure(s): IRRIGATION AND DEBRIDEMENT OF CHEST WOUND WITH WOUND VAC (N/A) today. The various methods of treatment have been discussed with the patient. After consideration of the risks, benefits and treatment options the patient has consented to the planned procedure.   The patient has been seen and labs reviewed. There are no changes in the patient's condition to prevent proceeding with the planned procedure today.  Recent labs:  Lab Results  Component Value Date   WBC 8.3 04/18/2020   HGB 8.2 (L) 04/18/2020   HCT 24.9 (L) 04/18/2020   PLT 234 04/18/2020   GLUCOSE 103 (H) 04/18/2020   CHOL 97 07/21/2018   TRIG 39 07/21/2018   HDL 32 (L) 07/21/2018   LDLCALC 57 07/21/2018   ALT 14 04/16/2020   AST 19 04/16/2020   NA 141 04/18/2020   K 4.1 04/18/2020   CL 102 04/18/2020   CREATININE 0.86 04/18/2020   BUN 11 04/18/2020   CO2 24 04/18/2020   TSH 0.880 07/24/2018   INR 1.5 (H) 04/18/2020   HGBA1C 6.2 (H) 04/16/2020     Procedure planned to explore upper abdomen lower sternal wound for recurent infection of power cord tunnel. He had 1/2 blood cultures positive 72 hrs ago. CT shows small subxyphoid collection above power cord and oujtflow bend relief. I expect he will   eventually need muscle flap coverage of wound at a later date to resolve this recurrent infection. This information was d/w patient and wife including risk of bleeding and redo sternotomy. [name], MD 04/18/2020 10:50 AM

## 2020-04-18 NOTE — Brief Op Note (Addendum)
04/16/2020 - 04/18/2020  3:15 PM  PATIENT:  Theodore Welch  59 y.o. male  PRE-OPERATIVE DIAGNOSIS:  1. SUBXIPHOID ABSCESS and CELLULITIS    POST-OPERATIVE DIAGNOSIS:  1. SUBXIPHOID ABSCESS and CELLULITIS  2. BLEEDING from OUTFLOW LINE ol VAD  PROCEDURE:  IRRIGATION AND DEBRIDEMENT OF CHEST WOUND WITH WOUND VAC, REDO EXPOSURE of MEDIAN STERNOTOMY INCISION and REPAIR OF VAD OUTFLOW GRAFT BLEEDING on  Fem-Fem Cardiopulmonary Bypass  SURGEON:  Surgeon(s) and Role:    Kerin Perna, MD - Primary  PHYSICIAN ASSISTANT: Doree Fudge PA-C  ANESTHESIA:   general  EBL:  700cc  BLOOD ADMINISTERED:Multiple products. Please see perfusion and anesthesia record  COUNTS CORRECTION: YES  DICTATION: .Dragon Dictation  PLAN OF CARE: Admit to inpatient   PATIENT DISPOSITION:  ICU - intubated and hemodynamically stable.   Delay start of Pharmacological VTE agent (>24hrs) due to surgical blood loss or risk of bleeding: yes- no heparin until am rounds  PLAN- clean wound with VAC and iv Ancef- eval for muscle flap coverage of wound and exposed VAD components next week

## 2020-04-18 NOTE — Anesthesia Preprocedure Evaluation (Addendum)
Anesthesia Evaluation  Patient identified by MRN, date of birth, ID band Patient awake    Reviewed: Allergy & Precautions, NPO status , Patient's Chart, lab work & pertinent test results  Airway Mallampati: II  TM Distance: >3 FB Neck ROM: Full    Dental  (+) Dental Advisory Given   Pulmonary COPD (O2 prn),  COPD inhaler and oxygen dependent, Current Smoker and Patient abstained from smoking.,    breath sounds clear to auscultation       Cardiovascular hypertension, Pt. on medications +CHF and + DVT   Rhythm:Regular Rate:Normal  ECG: NSR, rate 87  ECHO: 1. Significantly limited study with very poor endocardial visualization. 2. Left ventricular ejection fraction, by estimation, is 20%. Left ventricular ejection fraction by PLAX is 20 %. The left ventricle has severely decreased function. Left ventricular endocardial border not optimally defined to evaluate regional wall motion. There is mild left ventricular hypertrophy. Left ventricular diastolic parameters are indeterminate. 3. Right ventricular systolic function was not well visualized. The right ventricular size is not well visualized. 4. The aortic valve was not well visualized. Aortic valve regurgitation is not visualized. 5. The mitral valve was not well visualized. No evidence of mitral valve regurgitation. 6. LV inflow cannula not visualized. Comparison(s): 08/10/18: LVEF 15-20%.  LVAD (left ventricular assist device) present   S/P HeartMate 3 placement 2019 MSSA infection of LVAD power cord tunnel with recurrent drainage associated with bleeding in upper abdominal incision   Neuro/Psych negative neurological ROS  negative psych ROS   GI/Hepatic Neg liver ROS, PUD,   Endo/Other  diabetes  Renal/GU negative Renal ROS     Musculoskeletal Chronic back pain   Abdominal   Peds  Hematology  (+) anemia ,   Anesthesia Other Findings BLEEDING FROM WOUND   Reproductive/Obstetrics                          Anesthesia Physical Anesthesia Plan  ASA: IV  Anesthesia Plan: General   Post-op Pain Management:    Induction: Intravenous  PONV Risk Score and Plan: 1 and Ondansetron, Dexamethasone and Treatment may vary due to age or medical condition  Airway Management Planned: Oral ETT  Additional Equipment: Arterial line  Intra-op Plan:   Post-operative Plan: Extubation in OR  Informed Consent: I have reviewed the patients History and Physical, chart, labs and discussed the procedure including the risks, benefits and alternatives for the proposed anesthesia with the patient or authorized representative who has indicated his/her understanding and acceptance.     Dental advisory given  Plan Discussed with: Surgeon and CRNA  Anesthesia Plan Comments:       Anesthesia Quick Evaluation

## 2020-04-18 NOTE — Progress Notes (Signed)
PHARMACY - PHYSICIAN COMMUNICATION CRITICAL VALUE ALERT - BLOOD CULTURE IDENTIFICATION (BCID)  Theodore Welch is an 59 y.o. male who presented to [facility] on 04/16/2020 with a chief complaint of wound infection  Assessment:  1/2 BC with MSSA  Name of physician (or Provider) Contacted: n/a  Current antibiotics: Ancef  Changes to prescribed antibiotics recommended:  None  Results for orders placed or performed in visit on 03/31/19  Blood Culture ID Panel (Reflexed) (Collected: 03/31/2019  8:55 AM)  Result Value Ref Range   Enterococcus species NOT DETECTED NOT DETECTED   Listeria monocytogenes NOT DETECTED NOT DETECTED   Staphylococcus species DETECTED (A) NOT DETECTED   Staphylococcus aureus (BCID) DETECTED (A) NOT DETECTED   Methicillin resistance NOT DETECTED NOT DETECTED   Streptococcus species NOT DETECTED NOT DETECTED   Streptococcus agalactiae NOT DETECTED NOT DETECTED   Streptococcus pneumoniae NOT DETECTED NOT DETECTED   Streptococcus pyogenes NOT DETECTED NOT DETECTED   Acinetobacter baumannii NOT DETECTED NOT DETECTED   Enterobacteriaceae species NOT DETECTED NOT DETECTED   Enterobacter cloacae complex NOT DETECTED NOT DETECTED   Escherichia coli NOT DETECTED NOT DETECTED   Klebsiella oxytoca NOT DETECTED NOT DETECTED   Klebsiella pneumoniae NOT DETECTED NOT DETECTED   Proteus species NOT DETECTED NOT DETECTED   Serratia marcescens NOT DETECTED NOT DETECTED   Haemophilus influenzae NOT DETECTED NOT DETECTED   Neisseria meningitidis NOT DETECTED NOT DETECTED   Pseudomonas aeruginosa NOT DETECTED NOT DETECTED   Candida albicans NOT DETECTED NOT DETECTED   Candida glabrata NOT DETECTED NOT DETECTED   Candida krusei NOT DETECTED NOT DETECTED   Candida parapsilosis NOT DETECTED NOT DETECTED   Candida tropicalis NOT DETECTED NOT DETECTED    [name] 04/18/2020  3:51 AM

## 2020-04-18 NOTE — Anesthesia Procedure Notes (Signed)
Arterial Line Insertion Start/End6/11/2019 11:14 AM, 04/18/2020 11:16 AM Performed by: Rachel Moulds, CRNA, CRNA  Patient location: OR. Preanesthetic checklist: patient identified, IV checked, site marked, risks and benefits discussed, surgical consent, monitors and equipment checked, pre-op evaluation, timeout performed and anesthesia consent Lidocaine 1% used for infiltration and patient sedated Right, radial was placed Catheter size: 20 G Hand hygiene performed  and maximum sterile barriers used  Allen's test indicative of satisfactory collateral circulation Attempts: 2 Procedure performed using ultrasound guided technique. Following insertion, Biopatch and dressing applied. Post procedure assessment: normal  Patient tolerated the procedure well with no immediate complications.

## 2020-04-19 ENCOUNTER — Inpatient Hospital Stay: Payer: Self-pay

## 2020-04-19 ENCOUNTER — Inpatient Hospital Stay (HOSPITAL_COMMUNITY): Payer: Medicare HMO

## 2020-04-19 ENCOUNTER — Other Ambulatory Visit (HOSPITAL_COMMUNITY): Payer: Medicare HMO

## 2020-04-19 ENCOUNTER — Encounter: Payer: Self-pay | Admitting: *Deleted

## 2020-04-19 LAB — CBC
HCT: 35.4 % — ABNORMAL LOW (ref 39.0–52.0)
Hemoglobin: 11.9 g/dL — ABNORMAL LOW (ref 13.0–17.0)
MCH: 29.8 pg (ref 26.0–34.0)
MCHC: 33.6 g/dL (ref 30.0–36.0)
MCV: 88.7 fL (ref 80.0–100.0)
Platelets: 168 10*3/uL (ref 150–400)
RBC: 3.99 MIL/uL — ABNORMAL LOW (ref 4.22–5.81)
RDW: 14.2 % (ref 11.5–15.5)
WBC: 18 10*3/uL — ABNORMAL HIGH (ref 4.0–10.5)
nRBC: 0 % (ref 0.0–0.2)

## 2020-04-19 LAB — BPAM PLATELET PHERESIS
Blood Product Expiration Date: 202106032359
Blood Product Expiration Date: 202106042359
Blood Product Expiration Date: 202106042359
ISSUE DATE / TIME: 202106011411
ISSUE DATE / TIME: 202106011436
Unit Type and Rh: 7300
Unit Type and Rh: 9500
Unit Type and Rh: 9500

## 2020-04-19 LAB — PREPARE CRYOPRECIPITATE
Unit division: 0
Unit division: 0

## 2020-04-19 LAB — BASIC METABOLIC PANEL
Anion gap: 8 (ref 5–15)
BUN: 13 mg/dL (ref 8–23)
CO2: 25 mmol/L (ref 22–32)
Calcium: 8.2 mg/dL — ABNORMAL LOW (ref 8.9–10.3)
Chloride: 109 mmol/L (ref 98–111)
Creatinine, Ser: 1.03 mg/dL (ref 0.61–1.24)
GFR calc Af Amer: >60 mL/min (ref >60)
GFR calc non Af Amer: >60 mL/min (ref >60)
Glucose, Bld: 168 mg/dL — ABNORMAL HIGH (ref 70–99)
Potassium: 4.6 mmol/L (ref 3.5–5.1)
Sodium: 142 mmol/L (ref 135–145)

## 2020-04-19 LAB — PREPARE PLATELET PHERESIS
Unit division: 0
Unit division: 0
Unit division: 0

## 2020-04-19 LAB — POCT I-STAT 7, (LYTES, BLD GAS, ICA,H+H)
Acid-Base Excess: 1 mmol/L (ref 0.0–2.0)
Acid-Base Excess: 2 mmol/L (ref 0.0–2.0)
Bicarbonate: 26.5 mmol/L (ref 20.0–28.0)
Bicarbonate: 26.2 mmol/L (ref 20.0–28.0)
Calcium, Ion: 1.24 mmol/L (ref 1.15–1.40)
Calcium, Ion: 1.23 mmol/L (ref 1.15–1.40)
HCT: 33 % — ABNORMAL LOW (ref 39.0–52.0)
HCT: 31 % — ABNORMAL LOW (ref 39.0–52.0)
Hemoglobin: 11.2 g/dL — ABNORMAL LOW (ref 13.0–17.0)
Hemoglobin: 10.5 g/dL — ABNORMAL LOW (ref 13.0–17.0)
O2 Saturation: 100 %
O2 Saturation: 99 %
Patient temperature: 37.5
Patient temperature: 37
Potassium: 4.5 mmol/L (ref 3.5–5.1)
Potassium: 4.4 mmol/L (ref 3.5–5.1)
Sodium: 143 mmol/L (ref 135–145)
Sodium: 144 mmol/L (ref 135–145)
TCO2: 28 mmol/L (ref 22–32)
TCO2: 27 mmol/L (ref 22–32)
pCO2 arterial: 46.9 mmHg (ref 32.0–48.0)
pCO2 arterial: 36.5 mmHg (ref 32.0–48.0)
pH, Arterial: 7.362 (ref 7.350–7.450)
pH, Arterial: 7.464 — ABNORMAL HIGH (ref 7.350–7.450)
pO2, Arterial: 202 mmHg — ABNORMAL HIGH (ref 83.0–108.0)
pO2, Arterial: 143 mmHg — ABNORMAL HIGH (ref 83.0–108.0)

## 2020-04-19 LAB — GLUCOSE, CAPILLARY
Glucose-Capillary: 165 mg/dL — ABNORMAL HIGH (ref 70–99)
Glucose-Capillary: 174 mg/dL — ABNORMAL HIGH (ref 70–99)
Glucose-Capillary: 152 mg/dL — ABNORMAL HIGH (ref 70–99)
Glucose-Capillary: 126 mg/dL — ABNORMAL HIGH (ref 70–99)
Glucose-Capillary: 101 mg/dL — ABNORMAL HIGH (ref 70–99)
Glucose-Capillary: 105 mg/dL — ABNORMAL HIGH (ref 70–99)

## 2020-04-19 LAB — PROTIME-INR
INR: 1.3 — ABNORMAL HIGH (ref 0.8–1.2)
Prothrombin Time: 15.6 seconds — ABNORMAL HIGH (ref 11.4–15.2)

## 2020-04-19 LAB — BPAM CRYOPRECIPITATE
Blood Product Expiration Date: 202106011908
Blood Product Expiration Date: 202106012040
ISSUE DATE / TIME: 202106011438
ISSUE DATE / TIME: 202106011510
Unit Type and Rh: 5100
Unit Type and Rh: 5100

## 2020-04-19 LAB — LACTATE DEHYDROGENASE: LDH: 252 U/L — ABNORMAL HIGH (ref 98–192)

## 2020-04-19 MED ORDER — ZINC SULFATE 220 (50 ZN) MG PO CAPS
220.0000 mg | ORAL_CAPSULE | Freq: Two times a day (BID) | ORAL | Status: DC
  Administered 2020-04-19 – 2020-05-06 (×33): 220 mg via ORAL
  Filled 2020-04-19 (×32): qty 1

## 2020-04-19 MED ORDER — NOREPINEPHRINE 4 MG/250ML-% IV SOLN: 0.0000 ug/min | INTRAVENOUS | Status: DC

## 2020-04-19 MED ORDER — FENTANYL CITRATE (PF) 100 MCG/2ML IJ SOLN
25.0000 ug | INTRAMUSCULAR | Status: DC | PRN
  Administered 2020-04-23 – 2020-04-29 (×5): 25 ug via INTRAVENOUS
  Filled 2020-04-19 (×6): qty 2

## 2020-04-19 MED ORDER — HEPARIN (PORCINE) 25000 UT/250ML-% IV SOLN
700.0000 [IU]/h | INTRAVENOUS | Status: DC
  Administered 2020-04-19: 500 [IU]/h via INTRAVENOUS
  Administered 2020-04-21: 700 [IU]/h via INTRAVENOUS
  Filled 2020-04-19 (×2): qty 250

## 2020-04-19 MED ORDER — GABAPENTIN 300 MG PO CAPS
600.0000 mg | ORAL_CAPSULE | Freq: Four times a day (QID) | ORAL | Status: DC
  Administered 2020-04-19 – 2020-05-06 (×64): 600 mg via ORAL
  Filled 2020-04-19 (×64): qty 2

## 2020-04-19 MED ORDER — OXYCODONE HCL 5 MG PO TABS
5.0000 mg | ORAL_TABLET | ORAL | Status: DC | PRN
  Administered 2020-04-19 – 2020-04-26 (×16): 5 mg via ORAL
  Filled 2020-04-19 (×17): qty 1

## 2020-04-19 NOTE — Progress Notes (Signed)
[facility]    Date of Admission:  04/16/2020   Total days of antibiotics 5  ID: Theodore Welch is a 59 y.o. male with  MSSA bacteremia and Active Problems:   Infection associated with driveline of left ventricular assist device (LVAD) (HCC)    Subjective: had post op temps of up to 100.8 F last night, afebrile this morning. Feeling parched, like a desert, drinking fluids and eating jello. Denies significant discomfort from debridement. Has wound vac in place. Still upset about how he may have contracted covid, though no pulmonary symptoms. His children are driving from out of state to come to [facility].  His blood cx from 5/30 - 1/4 bottles + MSSA  Medications:  . gabapentin  600 mg Oral QID  . insulin aspart  0-15 Units Subcutaneous TID WC  . insulin aspart  0-5 Units Subcutaneous QHS  . mupirocin cream   Topical BID  . pantoprazole  40 mg Oral Daily  . thiamine  100 mg Oral Daily  . varenicline  1 mg Oral Daily  . vitamin B-12  1,000 mcg Oral Daily  . zinc sulfate  220 mg Oral BID    Objective: Vital signs in last 24 hours: Temp:  [98.2 F (36.8 C)-100.8 F (38.2 C)] 99.1 F (37.3 C) (06/02 1500) Pulse Rate:  [27-102] 70 (06/02 1500) Resp:  [13-28] 17 (06/02 1500) BP: (46-114)/(25-97) 114/97 (06/02 1500) SpO2:  [94 %-100 %] 99 % (06/02 1500) Arterial Line BP: (64-135)/(51-91) 110/65 (06/02 1300) FiO2 (%):  [40 %-50 %] 40 % (06/02 0759) Weight:  [92.4 kg] 92.4 kg (06/02 0300) Physical Exam  Constitutional: He is oriented to person, place, and time. He appears well-developed and well-nourished. No distress.  HENT:  Mouth/Throat: Oropharynx is clear and moist. No oropharyngeal exudate.  Cardiovascular: generator hum Chesta/bd wall = wound vac in place Pulmonary/Chest: Effort normal and breath sounds normal. No respiratory distress. He has no wheezes.  Abdominal: Soft. Bowel sounds are normal. He exhibits no distension.  Mild tenderness on  palpation Neurological: He is alert and oriented to person, place, and time.  Ext: left upper arm echymosis Skin: Skin is warm and dry. No rash noted. No erythema.  Psychiatric: He has a normal mood and affect. His behavior is normal.     Lab Results Recent Labs    04/18/20 1709 04/18/20 1709 04/19/20 0341 04/19/20 0341 04/19/20 0350 04/19/20 0832  WBC 18.5*  --  18.0*  --   --   --   HGB 13.5   < > 11.9*   < > 11.2* 10.5*  HCT 39.3   < > 35.4*   < > 33.0* 31.0*  NA 143   < > 142   < > 143 144  K 3.9   < > 4.6   < > 4.5 4.4  CL 109  --  109  --   --   --   CO2 25  --  25  --   --   --   BUN 11  --  13  --   --   --   CREATININE 0.79  --  1.03  --   --   --    < > = values in this interval not displayed.   Liver Panel Recent Labs    04/18/20 1709  PROT 4.6*  ALBUMIN 2.3*  AST 24  ALT 13  ALKPHOS 54  BILITOT 1.6*   Sedimentation Rate No  results for input(s): ESRSEDRATE in the last 72 hours. C-Reactive Protein No results for input(s): CRP in the last 72 hours.  Microbiology: 5/30 blood cx 1 of 4 MSSA 6/1 wound cx from OR - reincubating Studies/Results: DG Chest Port 1 View  Result Date: 04/19/2020 CLINICAL DATA:  LVAD.  COVID positive EXAM: PORTABLE CHEST 1 VIEW COMPARISON:  Yesterday FINDINGS: Endotracheal tube tip is just below the clavicular heads. The enteric tube reaches the stomach, advanced in the interval. Right IJ line with tip near the SVC origin. LVAD in place. No edema or visible pneumonia. No effusion or pneumothorax. Cardiomegaly. IMPRESSION: Unremarkable hardware. No evidence of active cardiopulmonary disease. Electronically Signed   By: Monte Fantasia M.D.   On: 04/19/2020 09:17   DG CHEST PORT 1 VIEW  Result Date: 04/18/2020 CLINICAL DATA:  Check gastric catheter placement EXAM: PORTABLE CHEST 1 VIEW COMPARISON:  04/18/2020 FINDINGS: LVAD is again identified. Cardiac shadow is enlarged. Postsurgical changes are seen. Right jugular central line and  endotracheal tube are noted and stable. Endoscopic ultrasound unit has been removed. A gastric catheter has been placed. Tip lies just above the gastroesophageal junction with the proximal side port in the distal esophagus. This should be advanced several cm further into the stomach. No focal infiltrate or effusion is seen. No pneumothorax is noted. No bony abnormality is seen. IMPRESSION: Tubes and lines as described above. Gastric catheter should be advanced several cm deeper into the stomach as it lies at the gastroesophageal junction currently. No focal infiltrate is seen. Electronically Signed   By: Inez Catalina M.D.   On: 04/18/2020 23:16   DG Chest Portable 1 View  Result Date: 04/18/2020 CLINICAL DATA:  59 year old male LVAD repair, irrigation and debridement of chest wound, postoperative day zero. EXAM: PORTABLE CHEST 1 VIEW COMPARISON:  Portable chest 04/16/2020 and earlier. FINDINGS: Portable AP supine view at 1553 hours. Probable transesophageal echo probe in place. LVAD hardware at the cardiac apex appears stable. Intubated, endotracheal tube tip in good position between the level the clavicles and carina. Right IJ central line placed, tip at the lower SVC level just above the carina. No pneumothorax or pleural effusion is evident on this supine view. Stable cardiac size and mediastinal contours. Lower lung volumes. Mild perihilar atelectasis suspected with no overt edema. Paucity of bowel gas in the upper abdomen. IMPRESSION: 1. Lines and tubes appear appropriately placed as above, with probable transesophageal echo probe in place. 2. Lower lung volumes with mild atelectasis. No edema or pneumothorax is evident. Electronically Signed   By: Genevie Ann M.D.   On: 04/18/2020 16:07   DG Abd Portable 1V  Result Date: 04/18/2020 CLINICAL DATA:  Orogastric tube placement EXAM: PORTABLE ABDOMEN - 1 VIEW COMPARISON:  None. FINDINGS: Orogastric tube tip and side port are in the stomach. Nonobstructive bowel  gas pattern. IMPRESSION: Orogastric tube tip and side port in the stomach. Electronically Signed   By: Ulyses Jarred M.D.   On: 04/18/2020 23:22   [name] EKG SITE RITE  Result Date: 04/19/2020 If Site Rite image not attached, placement could not be confirmed due to current cardiac rhythm.    Assessment/Plan: MSSA LVAD driveline infection, subxyphoid abscess s/p debridement with secondary bacteremia = will continue on cefazolin. Since still bacteremic, we will repeat blood cx, would wait 72hr+ to ensure he has cleared bacteremia before picc line to (left arm). Would discontinue central line if no longer needed, when possible. Will plan for long term IV abtx with chronic  suppression.  Covid, asymptomatic = will repeat PCR testing on 6/5 morning, if negative, can discontinue off of isolation (day 7 of hospitalization since isolated). Again, his last test was barely positive ( in the non-reproducible, non-infectious range)      [facility] Cell: [phone] Pager: [phone]  04/19/2020, 4:47 PM

## 2020-04-19 NOTE — Progress Notes (Signed)
1 Day Post-Op Procedure(s) (LRB): REPAIR OF LVAD (N/A) Debridement Abdominal Wound Application Of Wound Vac (N/A) Subjective: Sternal debridement and repair of VAD outflow graft on   fem-fem bypass Extubated neuro intact HM3 speed up to 5500 rpm Will start low dose heparin after fem a-line removed Back to OR in 48 hrs for wound vac change Objective: Vital signs in last 24 hours: Temp:  [98.6 F (37 C)-100.8 F (38.2 C)] 98.6 F (37 C) (06/02 0759) Pulse Rate:  [27-102] 54 (06/02 0759) Cardiac Rhythm: Sinus bradycardia (06/02 0351) Resp:  [13-29] 20 (06/02 0759) BP: (46-127)/(25-95) 81/64 (06/02 0759) SpO2:  [94 %-100 %] 100 % (06/02 0759) Arterial Line BP: (64-147)/(51-98) 81/70 (06/02 0700) FiO2 (%):  [40 %-50 %] 40 % (06/02 0759) Weight:  [92.4 kg] 92.4 kg (06/02 0300)  Hemodynamic parameters for last 24 hours:    EXAM Normal VAD hum Wound VAC w/o bleeding neuro intact  Intake/Output from previous day: 06/01 0701 - 06/02 0700 In: 8487.8 [I.V.:3345.4; NGEXB:2841; IV Piggyback:563.4] Out: 6380 [Urine:2030; Drains:100; Blood:4250] Intake/Output this shift: No intake/output data recorded.   Lab Results: Recent Labs    04/18/20 1709 04/18/20 1709 04/19/20 0341 04/19/20 0350  WBC 18.5*  --  18.0*  --   HGB 13.5   < > 11.9* 11.2*  HCT 39.3   < > 35.4* 33.0*  PLT 118*  --  168  --    < > = values in this interval not displayed.   BMET:  Recent Labs    04/18/20 1709 04/18/20 1709 04/19/20 0341 04/19/20 0350  NA 143   < > 142 143  K 3.9   < > 4.6 4.5  CL 109  --  109  --   CO2 25  --  25  --   GLUCOSE 80  --  168*  --   BUN 11  --  13  --   CREATININE 0.79  --  1.03  --   CALCIUM 7.6*  --  8.2*  --    < > = values in this interval not displayed.    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

## 2020-04-19 NOTE — Progress Notes (Addendum)
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   HM III LVAD implanted on 08/03/18 by Dr. Laneta Simmers under Destination Therapy criteria due to smoking status.  Recently, the lower sternal wound site had reopened with serosanguineous drainage culturing MSSA.  He had been on cephalexin and doxycycline.  He presented to ED on 04/16/20 with bleeding from sternal wound with SOB and diaphoresis. He was initially hypotensive with MAP 62.  Afebrile, WBCs 16.6, hgb 10.5 with INR 2.5.  He had a suture placed by the ER MD in the wound with control of bleeding.   Vital signs: Temp: 100.8   HR: 73 Doppler Pressure: 82 Automatic BP: 104/92 (98) O2 Sat: 100% RA Wt: 197>189>189>203 lbs   LVAD interrogation reveals:  Speed: 5700 Flow: 4.5 Power: 4.3w PI: 3.6  Alarms: one Low Flow  Events:  90 PI events on 5/31 Hematocrit: 25  Fixed speed: 5700 Low speed limit: 5400  Drive Line: gauze dressing C/D/I with anchor attached and accurately applied. Weekly dressing changes or as needed to keep site clean and dry. Pt has CHG allergy - clean with betadine. Next dressing will be done today in OR after sternal wound debridement.   Labs:  LDH trend: 182>194>140  INR trend: 2.5>1.9>1.5>1.4  Anticoagulation Plan: -INR Goal: 2.0 - 2.5 - warfarin on hold -ASA Dose: 81 mg daily - on hold  Blood Products:  - 04/16/20>FFP x 2; PRBC x 1 - 04/17/20>>FFP x 1; PRBC x 1  Device: N/A  Respiratory: intubated intra op 04/18/20  Infection:  - 04/16/20>>Covid positive - 04/16/20>>BCs staph aureus - 04/18/20>>sternal wound culture intra-op>>pending   Plan/Recommendations:  1. Call VAD Coordinator if any VAD equipment or drive line issues. 2. VAD Coordinator will accompany patient to OR today for sternal wound debridement.   Hessie Diener RN, VAD Coordinator 24/7 VAD Pager: [phone]

## 2020-04-19 NOTE — Progress Notes (Signed)
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   HM III LVAD implanted on 08/03/18 by Dr. Laneta Simmers under Destination Therapy criteria due to smoking status.  Pt awake, drowsy. BS nurse reports he was just extubated. Answers questions, MOE x 4, denies complaints other than being "thirsty".   HR down this am; nurse reports Precedex just stopped.   Sternal wound vac in place with no leaks noted.   Vital signs: Temp: 98.6 HR: 48 Doppler Pressure:  80 A line: 84/66 (74) O2 Sat: 100% 4 L/ Wt: 197>189>189>203.7 lbs   LVAD interrogation reveals:  Speed: 5500 Flow: 3.9 Power: 3.9w PI: 4.6  Alarms: six Low Flows overnight Events:  20 PI events on 6/1 Hematocrit: 35  Fixed speed: 5500 Low speed limit: 5200  Drive Line: gauze dressing C/D/I with anchor attached and accurately applied. Every other day dressing changes or as needed to keep site clean and dry. Pt has CHG allergy - clean with betadine. Next dressing will be done Friday in OR after sternal wound debridement.   Labs:  LDH trend: 182>194>140>252  INR trend: 2.5>1.9>1.5>1.4>1.3  Anticoagulation Plan: -INR Goal: 2.0 - 2.5 - warfarin on hold -ASA Dose: 81 mg daily - on hold  Blood Products:  - 04/16/20>FFP x 2; PRBC x 1 - 04/17/20>>FFP x 1; PRBC x 1  Intra-op: - 04/18/20>>PRBC x 6; Plts x 2; FFP x 2; Cell saver 2330 ml  Device: N/A  Respiratory: intubated intra op 04/18/20 - extubated 04/19/20 am  Infection:  - 04/16/20>>Covid positive - 04/16/20>>BCs staph aureus - 04/18/20>>sternal wound culture intra-op>>pending   Plan/Recommendations:  1. Call VAD Coordinator if any VAD equipment or drive line issues. 2. VAD Coordinator will accompany patient to OR Friday for wound VAD change per Dr. Maren Beach.  Hessie Diener RN, VAD Coordinator 24/7 VAD Pager: [phone]

## 2020-04-19 NOTE — Progress Notes (Addendum)
Advanced Heart Failure VAD Team Note  PCP-Cardiologist: No primary care provider on file.   Subjective:    Here w/ subxiphoid abscess and cellulitis + bleeding from wound site. Admit Hgb 7.4. COVID +   S/p multiple transfusions. Has received total of 3 units PRBCs and 5 units FFP. Hgb up to 11.2 today.   Blood cultures 1/2 positive for MSSA.  On cefazolin.  WBC 15>>8.3>>18K. Spiked temp overnight, 100.8. ID following.  Went to OR 6/1 for I&D of chest wound + repair of VAD outflow graft. Required fem-fem cardiopulmonary bypass. Wound vac placed.   Extubated. A&O. Requiring low dose NE 3 mcg. MAPs in the 70s. Abdomen sore.  He had multiple low flow alarms overnight, lowest was 1.6.  LDL 252.    LVAD INTERROGATION:  HeartMate III LVAD:   Flow 4.0  liters/min, speed 5500, power 4.0 PI 4.5  Multiple PI events and multiple low flow alarms overnight, lowest flow 1.6  Objective:    Vital Signs:   Temp:  [98.6 F (37 C)-100.8 F (38.2 C)] 98.6 F (37 C) (06/02 0900) Pulse Rate:  [27-102] 48 (06/02 0900) Resp:  [13-29] 17 (06/02 0900) BP: (46-115)/(25-93) 99/77 (06/02 0900) SpO2:  [94 %-100 %] 100 % (06/02 0900) Arterial Line BP: (64-147)/(51-98) 90/61 (06/02 0900) FiO2 (%):  [40 %-50 %] 40 % (06/02 0759) Weight:  [92.4 kg] 92.4 kg (06/02 0300) Last BM Date: (PTA) Mean arterial Pressure 80-90s  Intake/Output:   Intake/Output Summary (Last 24 hours) at 04/19/2020 0934 Last data filed at 04/19/2020 0900 Gross per 24 hour  Intake 8245.79 ml  Output 6460 ml  Net 1785.79 ml     Physical Exam    General:  fatigue appearing, sitting up in bed. No resp difficulty HEENT: normal Neck: supple. JVP flat. Carotids 2+ bilat; no bruits. No lymphadenopathy or thyromegaly appreciated. Cor: Mechanical heart sounds with LVAD hum present. Sternal wound dressing dry and intact. No visible drainage. Wound vac in place w/ good seal    Lungs: clear Abdomen: mildly tender and mildly distended. No  hepatosplenomegaly. No bruits or masses. Good bowel sounds. Driveline: C/D/I; securement device intact and driveline incorporated, bandaged. No visible drainage.  Extremities: no cyanosis, clubbing, rash, edema Neuro: alert & orientedx3, cranial nerves grossly intact. moves all 4 extremities w/o difficulty. Affect pleasant   Telemetry   NSR with PVCs 80-90s   EKG   n/a   Labs   Basic Metabolic Panel: Recent Labs  Lab 04/16/20 0408 04/16/20 0408 04/17/20 0059 04/17/20 0059 04/18/20 0401 04/18/20 0401 04/18/20 1155 04/18/20 1303 04/18/20 1709 04/19/20 0341 04/19/20 0350  NA 137   < > 135   < > 141   < > 138 138 143 142 143  K 4.8   < > 4.4   < > 4.1   < > 4.6 4.6 3.9 4.6 4.5  CL 105  --  102  --  102  --   --   --  109 109  --   CO2 21*  --  19*  --  24  --   --   --  25 25  --   GLUCOSE 178*  --  151*  --  103*  --   --   --  80 168*  --   BUN 12  --  13  --  11  --   --   --  11 13  --   CREATININE 1.01  --  1.11  --  0.86  --   --   --  0.79 1.03  --   CALCIUM 9.3   < > 9.0   < > 9.4  --   --   --  7.6* 8.2*  --    < > = values in this interval not displayed.    Liver Function Tests: Recent Labs  Lab 04/16/20 0408 04/18/20 1709  AST 19 24  ALT 14 13  ALKPHOS 105 54  BILITOT 0.6 1.6*  PROT 7.1 4.6*  ALBUMIN 3.4* 2.3*   No results for input(s): LIPASE, AMYLASE in the last 168 hours. No results for input(s): AMMONIA in the last 168 hours.  CBC: Recent Labs  Lab 04/16/20 0408 04/16/20 0408 04/17/20 0059 04/17/20 0059 04/17/20 0546 04/17/20 0546 04/18/20 0401 04/18/20 0401 04/18/20 1155 04/18/20 1303 04/18/20 1709 04/19/20 0341 04/19/20 0350  WBC 16.6*   < > 15.0*  --  15.3*  --  8.3  --   --   --  18.5* 18.0*  --   NEUTROABS 10.9*  --   --   --   --   --   --   --   --   --   --   --   --   HGB 10.5*   < > 7.4*   < > 9.7*   < > 8.2*   < > 8.5* 10.2* 13.5 11.9* 11.2*  HCT 33.6*   < > 23.4*   < > 29.3*   < > 24.9*   < > 25.0* 30.0* 39.3 35.4* 33.0*   MCV 92.3   < > 91.8  --  88.8  --  87.7  --   --   --  87.1 88.7  --   PLT 424*   < > 268  --  262  --  234  --   --   --  118* 168  --    < > = values in this interval not displayed.    INR: Recent Labs  Lab 04/16/20 0408 04/17/20 0059 04/18/20 0401 04/18/20 1709 04/19/20 0341  INR 2.5* 1.9* 1.5* 1.4* 1.3*    Other results:  EKG:    Imaging   DG Chest Port 1 View  Result Date: 04/19/2020 CLINICAL DATA:  LVAD.  COVID positive EXAM: PORTABLE CHEST 1 VIEW COMPARISON:  Yesterday FINDINGS: Endotracheal tube tip is just below the clavicular heads. The enteric tube reaches the stomach, advanced in the interval. Right IJ line with tip near the SVC origin. LVAD in place. No edema or visible pneumonia. No effusion or pneumothorax. Cardiomegaly. IMPRESSION: Unremarkable hardware. No evidence of active cardiopulmonary disease. Electronically Signed   By: Marnee Spring M.D.   On: 04/19/2020 09:17   DG CHEST PORT 1 VIEW  Result Date: 04/18/2020 CLINICAL DATA:  Check gastric catheter placement EXAM: PORTABLE CHEST 1 VIEW COMPARISON:  04/18/2020 FINDINGS: LVAD is again identified. Cardiac shadow is enlarged. Postsurgical changes are seen. Right jugular central line and endotracheal tube are noted and stable. Endoscopic ultrasound unit has been removed. A gastric catheter has been placed. Tip lies just above the gastroesophageal junction with the proximal side port in the distal esophagus. This should be advanced several cm further into the stomach. No focal infiltrate or effusion is seen. No pneumothorax is noted. No bony abnormality is seen. IMPRESSION: Tubes and lines as described above. Gastric catheter should be advanced several cm deeper into the stomach as it lies at the gastroesophageal junction currently. No focal  infiltrate is seen. Electronically Signed   By: Inez Catalina M.D.   On: 04/18/2020 23:16   DG Chest Portable 1 View  Result Date: 04/18/2020 CLINICAL DATA:  59 year old male LVAD  repair, irrigation and debridement of chest wound, postoperative day zero. EXAM: PORTABLE CHEST 1 VIEW COMPARISON:  Portable chest 04/16/2020 and earlier. FINDINGS: Portable AP supine view at 1553 hours. Probable transesophageal echo probe in place. LVAD hardware at the cardiac apex appears stable. Intubated, endotracheal tube tip in good position between the level the clavicles and carina. Right IJ central line placed, tip at the lower SVC level just above the carina. No pneumothorax or pleural effusion is evident on this supine view. Stable cardiac size and mediastinal contours. Lower lung volumes. Mild perihilar atelectasis suspected with no overt edema. Paucity of bowel gas in the upper abdomen. IMPRESSION: 1. Lines and tubes appear appropriately placed as above, with probable transesophageal echo probe in place. 2. Lower lung volumes with mild atelectasis. No edema or pneumothorax is evident. Electronically Signed   By: Genevie Ann M.D.   On: 04/18/2020 16:07   DG Abd Portable 1V  Result Date: 04/18/2020 CLINICAL DATA:  Orogastric tube placement EXAM: PORTABLE ABDOMEN - 1 VIEW COMPARISON:  None. FINDINGS: Orogastric tube tip and side port are in the stomach. Nonobstructive bowel gas pattern. IMPRESSION: Orogastric tube tip and side port in the stomach. Electronically Signed   By: Ulyses Jarred M.D.   On: 04/18/2020 23:22   [name] EKG SITE RITE  Result Date: 04/19/2020 If Site Rite image not attached, placement could not be confirmed due to current cardiac rhythm.    Medications:     Scheduled Medications: . sodium chloride   Intravenous Once  . gabapentin  600 mg Per Tube QID  . insulin aspart  0-15 Units Subcutaneous TID WC  . insulin aspart  0-5 Units Subcutaneous QHS  . mouth rinse  15 mL Mouth Rinse 10 times per day  . mupirocin cream   Topical BID  . pantoprazole  40 mg Oral Daily  . sacubitril-valsartan  1 tablet Oral BID  . sodium chloride flush  10-40 mL Intracatheter Q12H  . thiamine   100 mg Oral Daily  . varenicline  1 mg Oral Daily  . vitamin B-12  1,000 mcg Oral Daily  . zinc sulfate  220 mg Oral Daily    Infusions: .  ceFAZolin (ANCEF) IV Stopped (04/19/20 0603)  . heparin    . 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 He previously required debridement of lower sternalwound with woundVAC therapy and IV antibiotics.This was completed with 3 surgical procedures including debridement and ACell application in March 8921.  More recently, the lower sternal wound reopened with drainage again culturing MSSA and he had been on cephalexin + doxycycline.  The day of admission, he developed persistent bleeding from the wound site and came to ER, bleeding now stopped with suture.  Bleeding again 5/30, now stopped again. Got an additional 1 units PRBCs on 5/31 and again 6/1.  - Overall he has received 3 units PRBCs + 5 units FFP.  - ID consulted and switched antibiotics to cefazolin.  1/2 blood cultures + for MSSA - WBC now up to 18K and  febrile overnight, mTemp 100.8. Abx recs per ID - CT chest/abd/pelvis--> Stable small amount of soft tissue tracking along the proximal drive line in the subcutaneous ventral left abdominal wall.  - s/p I&D + wound vac placement on 6/1 - Coumadin on hold for procedures. Continue IV heparin.  - INR down 1.3 - Plan Return to OR in 48 hr for wound vac change 2. COVID-19 infection: Asymptomatic. No cough/dyspnea.  WBCS 16.6-->15.2->8.3->18. Febrile overnight, mTemp 100.8 (also in setting of DL infection).  - Ferritin normal, D dimer 0.96.  LDH stable 252 - Discussed monoclonal antibody infusion with Dr. Kendrick Fries =>  hold off for now as not clear that he is actively infected.   - Per ID, maintain airborne precautions. Repeat COVID Test 6/1 positive.   3. 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 LVAD parameters stable.  Has been NYHA class II. He does not look grossly volume overloaded but wt up 4 lb, likely 2/2 perioperative fluids.  MAPs low initially, started on phenylephrine (may have been at least partly vagal as very diaphoretic with bleeding).  Now off  phenylephrine. On low dose NE, 3 mcg. MAPs low 70s. - May need dose of IV Lasix. Will defer to MD. Lasix.   - For now, will hold spironolactone and amlodipine. Received dose of Entresto this am.  - Hold ASA and warfarin with bleeding.  4. Smoking:He has cut back a lot, still taking Chantix. Encouraged to quit. 5. H/o RLE DVT: On warfarin for LVAD but held for wound exploration 6. OSA: Continue CPAP.  7. Hyperlipidemia: Atorvastatin.  8. Type II diabetes:SSI 9. Anemia, Acute blood loss: Hgb stable but trending back down, 10.5 today. Monitor for bleeding.   I reviewed the LVAD parameters from today, and compared the results to the patient's prior recorded data.  No programming changes were made.  The LVAD is functioning within specified parameters.  The patient performs LVAD self-test daily.  LVAD interrogation was negative for any significant power changes, alarms or PI events/speed drops.  LVAD equipment check completed and is in good working order.  Back-up equipment present.   LVAD education done on emergency procedures and precautions and reviewed exit site care.  Length of Stay: [address] Delmer Islam 04/19/2020, 9:34 AM  VAD Team --- VAD ISSUES ONLY--- Pager [phone] (7am - 7am)  Advanced Heart Failure Team  Pager [phone] (M-F; 7a - 4p)  Please contact [facility] for night-coverage after hours (4p -7a ) and weekends on [email or web address]  Patient seen with PA, agree with the above note.    Yesterday to OR for sternal debridement, had to go on pump to repair LVAD outflow graft.  Doing well today, now off norepinephrine.  Started back on low dose heparin.  No complaints, hungry.   General: Well appearing this am. NAD.  HEENT: Normal. Neck: Supple, JVP 8 cm. Carotids OK.  Cardiac:  Mechanical heart sounds with LVAD hum present.  Lungs:  CTAB, normal effort.  Abdomen:  NT, ND, no HSM. No bruits or masses. +BS  LVAD exit site: Well-healed and incorporated. Dressing dry and intact. No erythema or drainage. Stabilization device present and accurately applied. Driveline dressing changed daily per sterile technique. Extremities:  Warm and dry. No cyanosis, clubbing, rash, or edema.  Neuro:  Alert & oriented x 3. Cranial nerves grossly intact. Moves all 4 extremities w/o difficulty. Affect pleasant    Continue low dose heparin and off warfarin.  Plan for OR to  change wound vac Friday, then next week potentially for muscle flap closure.   Hold Entresto for now, just stopped norepinephrine.    WBCs 18, suspect some of this is stress response post-op.  He is on cefazolin for MSSA bacteremia.   Marca Ancona 04/19/2020 2:06 PM

## 2020-04-19 NOTE — Progress Notes (Signed)
VAD Coordinator Procedure Note:   VAD Coordinator met patient in 2C25. Pt undergoing sternal wound debridement per Dr. Darcey Nora today. Hemodynamics and VAD parameters monitored by myself and anesthesia throughout the procedure. Blood pressures were obtained with automatic cuff on right arm initially until A-line placed per anesthesia. Pt taken directly to OR 15 due to positive Covid status.      Time: Doppler Auto  BP/Aline Flow PI Power Speed  Pre-procedure:          Auto BP 11:00  120/97 (106) 4.5 3.6 4.3 5700           Secation Induction:          11:15  97/87 (92) 4.7 3.3 4.4            R radial A-line 11:30  107/89 (96) 4.7 3.2 4.3    11:45  100/89(95) 4.8 3.1 4.3    12:00  101/90 (96) 4.8 3.2 4.4    12:15  94/85 (89) 5.0 3.1 4.4    12:30  105/94 (100) 4.8 3.3 4.3    12:45  84/75 (80) 4.2 8.3 4.3    13:00  11/98 (106) 4.5 5.1 4.3    13:15  105/98 (100) 4.6 4.6 4.3    13:30  113/102 (106) 4.5 5.5 4.4    13:45  90/77 (85) 4.7 4.6 4.1   Weaning VAD 13:53      5500   13:56  106/90 (98) 3.7 7.7 3.7 5200   13:58      off  Pump on 14:53         14:55  83/73 (78) 4.0 5.7 3.7 5400   15:00  108/87 (96) 3.6 5.3 3.7    15:15  94/75 (81) 4.1 3.1 3.8    15:30  95/91 (94) 4.3 3.1 3.8    15:45  112/107 (110) 4.0 4.7 3.8    16:00  111/91 (101) 3.8 5.8 3.9    16:15   3.7 5.9 3.8    16:30   3.5 7.0 3.4             [facility] [name]) updated by VAD Coordinator via phone throughout the case. Dr. [name] and Tanda Rockers, [place] Coordinator updated her personally when patient placed on bypass. Dr. Lawson Fiscal spoke with her at end of case.   VAD Coordinator accompanied patient to [place] along with OR team. Report to bedside RN per OR team.     Patient Disposition: 4C62  Zada Girt RN, VAD Coordinator 24/7 VAD Pager: [phone]

## 2020-04-19 NOTE — Progress Notes (Signed)
Per ID place PICC line after 2pm today if blood culture resulted negative after 72 hours.

## 2020-04-19 NOTE — Procedures (Signed)
Extubation Procedure Note  Patient Details:   Name: Theodore Welch DOB: Feb 16, 1961 MRN: [id number]   Airway Documentation:    Vent end date: 04/19/20 Vent end time: 0837   Evaluation  O2 sats: stable throughout Complications: No apparent complications Patient did tolerate procedure well. Bilateral Breath Sounds: Diminished, Clear   Yes   Order received for extubation.  NIF >-20, VC 1000 ml.  Cuff leak positive prior to extubation.  Extubated to 4l [place].  Patient tolerated well; no complications noted.  No stridor noted.    Lysbeth Penner [facility] 04/19/2020, 8:37 AM

## 2020-04-19 NOTE — Anesthesia Postprocedure Evaluation (Signed)
Anesthesia Post Note  Patient: Theodore Welch  Procedure(s) Performed: REPAIR OF LVAD (N/A ) Debridement Abdominal Wound Application Of Wound Vac (N/A )     Patient location during evaluation: SICU Anesthesia Type: General Level of consciousness: sedated Pain management: pain level controlled Vital Signs Assessment: post-procedure vital signs reviewed and stable Respiratory status: patient remains intubated per anesthesia plan Cardiovascular status: stable Postop Assessment: no apparent nausea or vomiting Anesthetic complications: no    Last Vitals:  Vitals:   04/19/20 1300 04/19/20 1400  BP: 103/75 90/69  Pulse:  (!) 59  Resp: (!) 22 (!) 22  Temp: 37.3 C 37.2 C  SpO2:  100%    Last Pain:  Vitals:   04/19/20 1423  TempSrc:   PainSc: 2                  Kennieth Rad

## 2020-04-20 ENCOUNTER — Other Ambulatory Visit (HOSPITAL_COMMUNITY): Payer: Medicare HMO

## 2020-04-20 ENCOUNTER — Inpatient Hospital Stay (HOSPITAL_COMMUNITY): Payer: Medicare HMO

## 2020-04-20 LAB — PREPARE FRESH FROZEN PLASMA
Unit division: 0
Unit division: 0
Unit division: 0
Unit division: 0
Unit division: 0
Unit division: 0
Unit division: 0
Unit division: 0
Unit division: 0
Unit division: 0
Unit division: 0
Unit division: 0
Unit division: 0
Unit division: 0
Unit division: 0
Unit division: 0
Unit division: 0
Unit division: 0
Unit division: 0

## 2020-04-20 LAB — BPAM RBC
Blood Product Expiration Date: 202106242359
Blood Product Expiration Date: 202106242359
Blood Product Expiration Date: 202106262359
Blood Product Expiration Date: 202106262359
Blood Product Expiration Date: 202106272359
Blood Product Expiration Date: 202106282359
Blood Product Expiration Date: 202106282359
Blood Product Expiration Date: 202106282359
Blood Product Expiration Date: 202106282359
Blood Product Expiration Date: 202106282359
Blood Product Expiration Date: 202106282359
Blood Product Expiration Date: 202106282359
Blood Product Expiration Date: 202106292359
Blood Product Expiration Date: 202106292359
Blood Product Expiration Date: 202106292359
Blood Product Expiration Date: 202106292359
Blood Product Expiration Date: 202106292359
Blood Product Expiration Date: 202106292359
Blood Product Expiration Date: 202106292359
Blood Product Expiration Date: 202107022359
Blood Product Expiration Date: 202107052359
Blood Product Expiration Date: 202107052359
Blood Product Expiration Date: 202107052359
ISSUE DATE / TIME: 202105300946
ISSUE DATE / TIME: 202105310226
ISSUE DATE / TIME: 202106010828
ISSUE DATE / TIME: 202106011024
ISSUE DATE / TIME: 202106011024
ISSUE DATE / TIME: 202106011200
ISSUE DATE / TIME: 202106011200
ISSUE DATE / TIME: 202106011252
ISSUE DATE / TIME: 202106011252
ISSUE DATE / TIME: 202106011252
ISSUE DATE / TIME: 202106011252
ISSUE DATE / TIME: 202106011301
ISSUE DATE / TIME: 202106011419
ISSUE DATE / TIME: 202106011419
ISSUE DATE / TIME: 202106011522
ISSUE DATE / TIME: 202106011522
ISSUE DATE / TIME: 202106020905
ISSUE DATE / TIME: 202106020905
ISSUE DATE / TIME: 202106021026
ISSUE DATE / TIME: 202106021316
ISSUE DATE / TIME: 202106021317
ISSUE DATE / TIME: 202106022019
ISSUE DATE / TIME: 202106022019
Unit Type and Rh: 5100
Unit Type and Rh: 5100
Unit Type and Rh: 5100
Unit Type and Rh: 5100
Unit Type and Rh: 5100
Unit Type and Rh: 5100
Unit Type and Rh: 5100
Unit Type and Rh: 5100
Unit Type and Rh: 5100
Unit Type and Rh: 5100
Unit Type and Rh: 5100
Unit Type and Rh: 5100
Unit Type and Rh: 5100
Unit Type and Rh: 5100
Unit Type and Rh: 5100
Unit Type and Rh: 5100
Unit Type and Rh: 5100
Unit Type and Rh: 5100
Unit Type and Rh: 5100
Unit Type and Rh: 5100
Unit Type and Rh: 5100
Unit Type and Rh: 5100
Unit Type and Rh: 5100

## 2020-04-20 LAB — POCT I-STAT, CHEM 8
BUN: 11 mg/dL (ref 8–23)
BUN: 11 mg/dL (ref 8–23)
BUN: 11 mg/dL (ref 8–23)
Calcium, Ion: 1.06 mmol/L — ABNORMAL LOW (ref 1.15–1.40)
Calcium, Ion: 0.85 mmol/L — CL (ref 1.15–1.40)
Calcium, Ion: 0.92 mmol/L — ABNORMAL LOW (ref 1.15–1.40)
Chloride: 105 mmol/L (ref 98–111)
Chloride: 102 mmol/L (ref 98–111)
Chloride: 103 mmol/L (ref 98–111)
Creatinine, Ser: 0.6 mg/dL — ABNORMAL LOW (ref 0.61–1.24)
Creatinine, Ser: 0.6 mg/dL — ABNORMAL LOW (ref 0.61–1.24)
Creatinine, Ser: 0.7 mg/dL (ref 0.61–1.24)
Glucose, Bld: 120 mg/dL — ABNORMAL HIGH (ref 70–99)
Glucose, Bld: 154 mg/dL — ABNORMAL HIGH (ref 70–99)
Glucose, Bld: 207 mg/dL — ABNORMAL HIGH (ref 70–99)
HCT: 34 % — ABNORMAL LOW (ref 39.0–52.0)
HCT: 31 % — ABNORMAL LOW (ref 39.0–52.0)
HCT: 32 % — ABNORMAL LOW (ref 39.0–52.0)
Hemoglobin: 11.6 g/dL — ABNORMAL LOW (ref 13.0–17.0)
Hemoglobin: 10.5 g/dL — ABNORMAL LOW (ref 13.0–17.0)
Hemoglobin: 10.9 g/dL — ABNORMAL LOW (ref 13.0–17.0)
Potassium: 4.7 mmol/L (ref 3.5–5.1)
Potassium: 3.8 mmol/L (ref 3.5–5.1)
Potassium: 4.4 mmol/L (ref 3.5–5.1)
Sodium: 138 mmol/L (ref 135–145)
Sodium: 137 mmol/L (ref 135–145)
Sodium: 142 mmol/L (ref 135–145)
TCO2: 26 mmol/L (ref 22–32)
TCO2: 25 mmol/L (ref 22–32)
TCO2: 25 mmol/L (ref 22–32)

## 2020-04-20 LAB — TYPE AND SCREEN
ABO/RH(D): O POS
Antibody Screen: NEGATIVE
Unit division: 0
Unit division: 0
Unit division: 0
Unit division: 0
Unit division: 0
Unit division: 0
Unit division: 0
Unit division: 0
Unit division: 0
Unit division: 0
Unit division: 0
Unit division: 0
Unit division: 0
Unit division: 0
Unit division: 0
Unit division: 0
Unit division: 0
Unit division: 0
Unit division: 0
Unit division: 0
Unit division: 0
Unit division: 0
Unit division: 0

## 2020-04-20 LAB — BPAM FFP
Blood Product Expiration Date: 202106042359
Blood Product Expiration Date: 202106042359
Blood Product Expiration Date: 202106052359
Blood Product Expiration Date: 202106052359
Blood Product Expiration Date: 202106062359
Blood Product Expiration Date: 202106062359
Blood Product Expiration Date: 202106062359
Blood Product Expiration Date: 202106062359
Blood Product Expiration Date: 202106062359
Blood Product Expiration Date: 202106062359
Blood Product Expiration Date: 202106062359
Blood Product Expiration Date: 202106062359
Blood Product Expiration Date: 202106062359
Blood Product Expiration Date: 202106062359
Blood Product Expiration Date: 202106062359
Blood Product Expiration Date: 202106062359
Blood Product Expiration Date: 202106082359
Blood Product Expiration Date: 202106132359
Blood Product Expiration Date: 202106162359
Blood Product Expiration Date: 202106182359
Blood Product Expiration Date: 202106182359
Blood Product Expiration Date: 202106192359
ISSUE DATE / TIME: 202105311645
ISSUE DATE / TIME: 202105311645
ISSUE DATE / TIME: 202106011253
ISSUE DATE / TIME: 202106011253
ISSUE DATE / TIME: 202106011253
ISSUE DATE / TIME: 202106011253
ISSUE DATE / TIME: 202106011302
ISSUE DATE / TIME: 202106011302
ISSUE DATE / TIME: 202106020029
ISSUE DATE / TIME: 202106020029
ISSUE DATE / TIME: 202106020906
ISSUE DATE / TIME: 202106021627
ISSUE DATE / TIME: 202106021710
ISSUE DATE / TIME: 202106021710
ISSUE DATE / TIME: 202106022300
ISSUE DATE / TIME: 202106022300
ISSUE DATE / TIME: 202106030045
ISSUE DATE / TIME: 202106030045
ISSUE DATE / TIME: 202106030126
ISSUE DATE / TIME: 202106030126
ISSUE DATE / TIME: 202106030610
ISSUE DATE / TIME: 202106030819
Unit Type and Rh: 5100
Unit Type and Rh: 5100
Unit Type and Rh: 5100
Unit Type and Rh: 5100
Unit Type and Rh: 5100
Unit Type and Rh: 5100
Unit Type and Rh: 5100
Unit Type and Rh: 5100
Unit Type and Rh: 5100
Unit Type and Rh: 5100
Unit Type and Rh: 5100
Unit Type and Rh: 5100
Unit Type and Rh: 600
Unit Type and Rh: 600
Unit Type and Rh: 6200
Unit Type and Rh: 6200
Unit Type and Rh: 6200
Unit Type and Rh: 6200
Unit Type and Rh: 8400
Unit Type and Rh: 8400
Unit Type and Rh: 9500
Unit Type and Rh: 9500

## 2020-04-20 LAB — POCT I-STAT 7, (LYTES, BLD GAS, ICA,H+H)
Acid-Base Excess: 0 mmol/L (ref 0.0–2.0)
Acid-Base Excess: 1 mmol/L (ref 0.0–2.0)
Acid-base deficit: 1 mmol/L (ref 0.0–2.0)
Bicarbonate: 24.6 mmol/L (ref 20.0–28.0)
Bicarbonate: 24.6 mmol/L (ref 20.0–28.0)
Bicarbonate: 26.1 mmol/L (ref 20.0–28.0)
Calcium, Ion: 0.9 mmol/L — ABNORMAL LOW (ref 1.15–1.40)
Calcium, Ion: 1.14 mmol/L — ABNORMAL LOW (ref 1.15–1.40)
Calcium, Ion: 0.87 mmol/L — CL (ref 1.15–1.40)
HCT: 23 % — ABNORMAL LOW (ref 39.0–52.0)
HCT: 33 % — ABNORMAL LOW (ref 39.0–52.0)
HCT: 30 % — ABNORMAL LOW (ref 39.0–52.0)
Hemoglobin: 11.2 g/dL — ABNORMAL LOW (ref 13.0–17.0)
Hemoglobin: 7.8 g/dL — ABNORMAL LOW (ref 13.0–17.0)
Hemoglobin: 10.2 g/dL — ABNORMAL LOW (ref 13.0–17.0)
O2 Saturation: 100 %
O2 Saturation: 100 %
O2 Saturation: 100 %
Potassium: 4.6 mmol/L (ref 3.5–5.1)
Potassium: 4.7 mmol/L (ref 3.5–5.1)
Potassium: 3.9 mmol/L (ref 3.5–5.1)
Sodium: 138 mmol/L (ref 135–145)
Sodium: 139 mmol/L (ref 135–145)
Sodium: 143 mmol/L (ref 135–145)
TCO2: 26 mmol/L (ref 22–32)
TCO2: 26 mmol/L (ref 22–32)
TCO2: 28 mmol/L (ref 22–32)
pCO2 arterial: 34.3 mmHg (ref 32.0–48.0)
pCO2 arterial: 41.2 mmHg (ref 32.0–48.0)
pCO2 arterial: 52.6 mmHg — ABNORMAL HIGH (ref 32.0–48.0)
pH, Arterial: 7.384 (ref 7.350–7.450)
pH, Arterial: 7.464 — ABNORMAL HIGH (ref 7.350–7.450)
pH, Arterial: 7.303 — ABNORMAL LOW (ref 7.350–7.450)
pO2, Arterial: 249 mmHg — ABNORMAL HIGH (ref 83.0–108.0)
pO2, Arterial: 340 mmHg — ABNORMAL HIGH (ref 83.0–108.0)
pO2, Arterial: 502 mmHg — ABNORMAL HIGH (ref 83.0–108.0)

## 2020-04-20 LAB — URINALYSIS, ROUTINE W REFLEX MICROSCOPIC
Bilirubin Urine: NEGATIVE
Glucose, UA: NEGATIVE mg/dL
Hgb urine dipstick: NEGATIVE
Ketones, ur: NEGATIVE mg/dL
Leukocytes,Ua: NEGATIVE
Nitrite: NEGATIVE
Protein, ur: NEGATIVE mg/dL
Specific Gravity, Urine: 1.014 (ref 1.005–1.030)
pH: 6 (ref 5.0–8.0)

## 2020-04-20 LAB — IRON AND TIBC
Iron: 12 ug/dL — ABNORMAL LOW (ref 45–182)
Saturation Ratios: 5 % — ABNORMAL LOW (ref 17.9–39.5)
TIBC: 253 ug/dL (ref 250–450)
UIBC: 241 ug/dL

## 2020-04-20 LAB — BASIC METABOLIC PANEL
Anion gap: 6 (ref 5–15)
BUN: 10 mg/dL (ref 8–23)
CO2: 28 mmol/L (ref 22–32)
Calcium: 8.3 mg/dL — ABNORMAL LOW (ref 8.9–10.3)
Chloride: 106 mmol/L (ref 98–111)
Creatinine, Ser: 0.76 mg/dL (ref 0.61–1.24)
GFR calc Af Amer: >60 mL/min (ref >60)
GFR calc non Af Amer: >60 mL/min (ref >60)
Glucose, Bld: 109 mg/dL — ABNORMAL HIGH (ref 70–99)
Potassium: 3.9 mmol/L (ref 3.5–5.1)
Sodium: 140 mmol/L (ref 135–145)

## 2020-04-20 LAB — GLUCOSE, CAPILLARY
Glucose-Capillary: 104 mg/dL — ABNORMAL HIGH (ref 70–99)
Glucose-Capillary: 148 mg/dL — ABNORMAL HIGH (ref 70–99)
Glucose-Capillary: 111 mg/dL — ABNORMAL HIGH (ref 70–99)
Glucose-Capillary: 133 mg/dL — ABNORMAL HIGH (ref 70–99)
Glucose-Capillary: 112 mg/dL — ABNORMAL HIGH (ref 70–99)

## 2020-04-20 LAB — FERRITIN: Ferritin: 109 ng/mL (ref 24–336)

## 2020-04-20 LAB — CBC
HCT: 30 % — ABNORMAL LOW (ref 39.0–52.0)
Hemoglobin: 9.7 g/dL — ABNORMAL LOW (ref 13.0–17.0)
MCH: 29.7 pg (ref 26.0–34.0)
MCHC: 32.3 g/dL (ref 30.0–36.0)
MCV: 91.7 fL (ref 80.0–100.0)
Platelets: 151 10*3/uL (ref 150–400)
RBC: 3.27 MIL/uL — ABNORMAL LOW (ref 4.22–5.81)
RDW: 14.6 % (ref 11.5–15.5)
WBC: 10.8 10*3/uL — ABNORMAL HIGH (ref 4.0–10.5)
nRBC: 0 % (ref 0.0–0.2)

## 2020-04-20 LAB — CULTURE, BLOOD (ROUTINE X 2): Special Requests: ADEQUATE

## 2020-04-20 LAB — PROTIME-INR
INR: 1.2 (ref 0.8–1.2)
Prothrombin Time: 14.5 seconds (ref 11.4–15.2)

## 2020-04-20 LAB — PREPARE RBC (CROSSMATCH)

## 2020-04-20 LAB — HEPARIN LEVEL (UNFRACTIONATED): Heparin Unfractionated: <0.1 IU/mL — ABNORMAL LOW (ref 0.30–0.70)

## 2020-04-20 LAB — LACTATE DEHYDROGENASE: LDH: 191 U/L (ref 98–192)

## 2020-04-20 MED ORDER — CEFAZOLIN SODIUM-DEXTROSE 2-4 GM/100ML-% IV SOLN
2.0000 g | INTRAVENOUS | Status: AC
  Administered 2020-04-21: 2 g via INTRAVENOUS

## 2020-04-20 MED ORDER — DEXTROSE-NACL 5-0.45 % IV SOLN: INTRAVENOUS | Status: DC

## 2020-04-20 MED ORDER — SPIRONOLACTONE 25 MG PO TABS
25.0000 mg | ORAL_TABLET | Freq: Every day | ORAL | Status: DC
  Administered 2020-04-20 – 2020-04-29 (×9): 25 mg via ORAL
  Filled 2020-04-20 (×9): qty 1

## 2020-04-20 NOTE — Progress Notes (Signed)
Pharmacy Antibiotic Note  Theodore Welch is a 59 y.o. male admitted on 04/16/2020 LVAD sternal wound infection. Pt has hx of recurrent MSSA infection and is now s/p 8 weeks cefazolin followed by cephalexin.  S/p exploration in OR 6/1 with plans to go back to OR 6/4.   Afebrile, wbc just above normal at 10.8. Renal function normal.  Plan is for extended course of ancef per ID.   5/30 Bcx 1/2 MSSA  6/1 sternum: reincubate  Plan: Continue cefazolin 2g IV q8h for now  Height: 5\' 11"  (180.3 cm) Weight: 94.7 kg (208 lb 12.4 oz) IBW/kg (Calculated) : 75.3  Temp (24hrs), Avg:98.9 F (37.2 C), Min:98.1 F (36.7 C), Max:99.5 F (37.5 C)  Recent Labs  Lab 04/17/20 0059 04/17/20 0546 04/18/20 0401 04/18/20 1356 04/18/20 1433 04/18/20 1517 04/18/20 1709 04/19/20 0341 04/20/20 0500  WBC  --  15.3* 8.3  --   --   --  18.5* 18.0* 10.8*  CREATININE   < >  --  0.86   < > 0.70 0.60* 0.79 1.03 0.76   < > = values in this interval not displayed.    Estimated Creatinine Clearance: 114 mL/min (by C-G formula based on SCr of 0.76 mg/dL).    Allergies  Allergen Reactions  . Chlorhexidine Rash  . Other Rash    Prep pads    Thank you for allowing pharmacy to be a part of this patient's care.  06/20/20 PharmD., BCPS Clinical Pharmacist 04/20/2020 11:00 AM

## 2020-04-20 NOTE — Progress Notes (Addendum)
Advanced Heart Failure VAD Team Note  PCP-Cardiologist: No primary care provider on file.   Subjective:    Here w/ subxiphoid abscess and cellulitis + bleeding from wound site. Admit Hgb 7.4. COVID +   S/p multiple transfusions. Has received total of 3 units PRBCs and 5 units FFP. Hgb slowly trending back down, 9.7 today.   Blood cultures 1/2 positive for MSSA.  On cefazolin.  WBC improving, 18>>10. AF past 24 hrs. ID following.  Went to OR 6/1 for I&D of chest wound + repair of VAD outflow graft. Required fem-fem cardiopulmonary bypass. Wound vac placed. Going back to OR tomorrow for wound vac change.     Wt has been trending up, now at 208. Cleda Daub and [place] on hold. Was weaned of NE yesterday.  MAPs 70s-90s.   Sitting up in bed, playing on cell phone. Feels ok.    LVAD INTERROGATION:  HeartMate III LVAD:   Flow 4.4  liters/min, speed 5500, power 4.0 PI 3.3  Multiple PI events no further low flow alarms  Objective:    Vital Signs:   Temp:  [98.1 F (36.7 C)-99.5 F (37.5 C)] 99.1 F (37.3 C) (06/03 1028) Pulse Rate:  [57-173] 70 (06/03 1028) Resp:  [15-30] 22 (06/03 1028) BP: (74-125)/(44-97) 108/90 (06/03 1020) SpO2:  [80 %-100 %] 100 % (06/03 1028) Arterial Line BP: (109-122)/(65-77) 110/65 (06/02 1300) Weight:  [94.7 kg] 94.7 kg (06/03 0600) Last BM Date: (PTA) Mean arterial Pressure 80-90s  Intake/Output:   Intake/Output Summary (Last 24 hours) at 04/20/2020 1049 Last data filed at 04/20/2020 0900 Gross per 24 hour  Intake 401.58 ml  Output 2515 ml  Net -2113.42 ml     Physical Exam    General:  sitting up in bed. No resp difficulty HEENT: normal Neck: supple. JVP flat. Carotids 2+ bilat; no bruits. No lymphadenopathy or thyromegaly appreciated. Cor: Mechanical heart sounds with LVAD hum present. Sternal wound dressing dry and intact. No visible drainage. Wound vac in place w/ good seal    Lungs: clear Abdomen: mildly distended. No hepatosplenomegaly. No  bruits or masses. Good bowel sounds. Driveline: C/D/I; securement device intact and driveline incorporated, bandaged. No visible drainage.  Extremities: no cyanosis, clubbing, rash, edema Neuro: alert & orientedx3, cranial nerves grossly intact. moves all 4 extremities w/o difficulty. Affect pleasant GU: + Foley, urine clear/yellow    Telemetry   NSR 70s  EKG   n/a   Labs   Basic Metabolic Panel: Recent Labs  Lab 04/17/20 0059 04/17/20 0059 04/18/20 0401 04/18/20 0401 04/18/20 1155 04/18/20 1433 04/18/20 1513 04/18/20 1517 04/18/20 1709 04/19/20 0341 04/19/20 0350 04/19/20 0832 04/20/20 0500  NA 135   < > 141   < >   < > 137   < > 142 143 142 143 144 140  K 4.4   < > 4.1   < >   < > 4.4   < > 3.8 3.9 4.6 4.5 4.4 3.9  CL 102   < > 102  --    < > 103  --  102 109 109  --   --  106  CO2 19*  --  24  --   --   --   --   --  25 25  --   --  28  GLUCOSE 151*   < > 103*  --    < > 207*  --  154* 80 168*  --   --  109*  BUN 13   < >  11  --    < > 11  --  11 11 13   --   --  10  CREATININE 1.11   < > 0.86  --    < > 0.70  --  0.60* 0.79 1.03  --   --  0.76  CALCIUM 9.0   < > 9.4   < >  --   --   --   --  7.6* 8.2*  --   --  8.3*   < > = values in this interval not displayed.    Liver Function Tests: Recent Labs  Lab 04/16/20 0408 04/18/20 1709  AST 19 24  ALT 14 13  ALKPHOS 105 54  BILITOT 0.6 1.6*  PROT 7.1 4.6*  ALBUMIN 3.4* 2.3*   No results for input(s): LIPASE, AMYLASE in the last 168 hours. No results for input(s): AMMONIA in the last 168 hours.  CBC: Recent Labs  Lab 04/16/20 0408 04/17/20 0059 04/17/20 0546 04/17/20 0546 04/18/20 0401 04/18/20 1155 04/18/20 1709 04/19/20 0341 04/19/20 0350 04/19/20 0832 04/20/20 0500  WBC 16.6*   < > 15.3*  --  8.3  --  18.5* 18.0*  --   --  10.8*  NEUTROABS 10.9*  --   --   --   --   --   --   --   --   --   --   HGB 10.5*   < > 9.7*   < > 8.2*   < > 13.5 11.9* 11.2* 10.5* 9.7*  HCT 33.6*   < > 29.3*   < >  24.9*   < > 39.3 35.4* 33.0* 31.0* 30.0*  MCV 92.3   < > 88.8  --  87.7  --  87.1 88.7  --   --  91.7  PLT 424*   < > 262  --  234  --  118* 168  --   --  151   < > = values in this interval not displayed.    INR: Recent Labs  Lab 04/17/20 0059 04/18/20 0401 04/18/20 1709 04/19/20 0341 04/20/20 0500  INR 1.9* 1.5* 1.4* 1.3* 1.2    Other results:  EKG:    Imaging   DG Chest Port 1 View  Result Date: 04/20/2020 CLINICAL DATA:  Left ventricular assist device. EXAM: PORTABLE CHEST 1 VIEW COMPARISON:  04/20/2019 FINDINGS: The LVAD is in stable position. No complicating features are demonstrated. The right IJ central venous catheter is stable. The endotracheal tube and NG tubes have been removed. Stable cardiac enlargement.  No acute pulmonary findings. IMPRESSION: 1. Removal of endotracheal and NG tubes. 2. Stable LVAD. 3. No acute pulmonary findings. Electronically Signed   By: Marijo Sanes M.D.   On: 04/20/2020 09:38   DG Chest Port 1 View  Result Date: 04/19/2020 CLINICAL DATA:  LVAD.  COVID positive EXAM: PORTABLE CHEST 1 VIEW COMPARISON:  Yesterday FINDINGS: Endotracheal tube tip is just below the clavicular heads. The enteric tube reaches the stomach, advanced in the interval. Right IJ line with tip near the SVC origin. LVAD in place. No edema or visible pneumonia. No effusion or pneumothorax. Cardiomegaly. IMPRESSION: Unremarkable hardware. No evidence of active cardiopulmonary disease. Electronically Signed   By: Monte Fantasia M.D.   On: 04/19/2020 09:17   DG CHEST PORT 1 VIEW  Result Date: 04/18/2020 CLINICAL DATA:  Check gastric catheter placement EXAM: PORTABLE CHEST 1 VIEW COMPARISON:  04/18/2020 FINDINGS: LVAD is again identified. Cardiac shadow is enlarged.  Postsurgical changes are seen. Right jugular central line and endotracheal tube are noted and stable. Endoscopic ultrasound unit has been removed. A gastric catheter has been placed. Tip lies just above the  gastroesophageal junction with the proximal side port in the distal esophagus. This should be advanced several cm further into the stomach. No focal infiltrate or effusion is seen. No pneumothorax is noted. No bony abnormality is seen. IMPRESSION: Tubes and lines as described above. Gastric catheter should be advanced several cm deeper into the stomach as it lies at the gastroesophageal junction currently. No focal infiltrate is seen. Electronically Signed   By: Alcide Clever M.D.   On: 04/18/2020 23:16   DG Chest Portable 1 View  Result Date: 04/18/2020 CLINICAL DATA:  58 year old male LVAD repair, irrigation and debridement of chest wound, postoperative day zero. EXAM: PORTABLE CHEST 1 VIEW COMPARISON:  Portable chest 04/16/2020 and earlier. FINDINGS: Portable AP supine view at 1553 hours. Probable transesophageal echo probe in place. LVAD hardware at the cardiac apex appears stable. Intubated, endotracheal tube tip in good position between the level the clavicles and carina. Right IJ central line placed, tip at the lower SVC level just above the carina. No pneumothorax or pleural effusion is evident on this supine view. Stable cardiac size and mediastinal contours. Lower lung volumes. Mild perihilar atelectasis suspected with no overt edema. Paucity of bowel gas in the upper abdomen. IMPRESSION: 1. Lines and tubes appear appropriately placed as above, with probable transesophageal echo probe in place. 2. Lower lung volumes with mild atelectasis. No edema or pneumothorax is evident. Electronically Signed   By: Odessa Fleming M.D.   On: 04/18/2020 16:07   DG Abd Portable 1V  Result Date: 04/18/2020 CLINICAL DATA:  Orogastric tube placement EXAM: PORTABLE ABDOMEN - 1 VIEW COMPARISON:  None. FINDINGS: Orogastric tube tip and side port are in the stomach. Nonobstructive bowel gas pattern. IMPRESSION: Orogastric tube tip and side port in the stomach. Electronically Signed   By: Deatra Robinson M.D.   On: 04/18/2020 23:22     [name] EKG SITE RITE  Result Date: 04/19/2020 If Site Rite image not attached, placement could not be confirmed due to current cardiac rhythm.    Medications:     Scheduled Medications: . gabapentin  600 mg Oral QID  . insulin aspart  0-15 Units Subcutaneous TID WC  . insulin aspart  0-5 Units Subcutaneous QHS  . mupirocin cream   Topical BID  . pantoprazole  40 mg Oral Daily  . thiamine  100 mg Oral Daily  . varenicline  1 mg Oral Daily  . vitamin B-12  1,000 mcg Oral Daily  . zinc sulfate  220 mg Oral BID    Infusions: .  ceFAZolin (ANCEF) IV Stopped (04/20/20 8127)  . [START ON 04/21/2020]  ceFAZolin (ANCEF) IV    . dextrose 5 % and 0.45% NaCl    . heparin 700 Units/hr (04/20/20 0857)  . norepinephrine (LEVOPHED) Adult infusion Stopped (04/19/20 1110)    PRN Medications: acetaminophen, fentaNYL (SUBLIMAZE) injection, fluticasone, hydrALAZINE, Ipratropium-Albuterol, ondansetron (ZOFRAN) IV, oxyCODONE, sodium chloride flush, zolpidem   Assessment/Plan:    1. 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 He previously required debridement of lower sternalwound with woundVAC therapy and IV antibiotics.This was completed with 3 surgical procedures including debridement and ACell application in March 2021.  More recently, the lower sternal wound reopened with drainage again culturing MSSA and he had been on cephalexin + doxycycline.  The day of admission, he developed persistent bleeding from the wound site and came to ER, bleeding now stopped with suture.  Bleeding again 5/30, now stopped again. Got an additional 1 units PRBCs on 5/31 and again 6/1.  - Overall he has received 3 units PRBCs + 5 units FFP.  - ID consulted and switched antibiotics to cefazolin.  1/2 blood cultures + for MSSA - WBC trending down, 18>>10. AF past 24 hr.  - Continue abx per ID  - CT chest/abd/pelvis--> Stable small amount of soft tissue tracking  along the proximal drive line in the subcutaneous ventral left abdominal wall.  - s/p I&D + wound vac placement on 6/1. Plan return to OR 6/4 for wound vac replacement and potentially muscle flap closure next week  - Coumadin on hold for procedures. Continue IV heparin.  - INR down 1.2 2. COVID-19 infection: Asymptomatic. No cough/dyspnea.  WBCS 16.6-->15.2->8.3->18->10. AF past 24 hs. - Ferritin normal, D dimer 0.96.  LDH stable 191 - Discussed monoclonal antibody infusion with Dr. Kendrick Fries => hold off for now as not clear that he is actively infected. His last test was barely positive ( in the non-reproducible, non-infectious range) - Per ID, will repeat PCR testing on 6/5. If negative, can discontinue off of isolation   3. 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 LVAD parameters stable.  Has been NYHA class II. He does not look grossly volume overloaded but wt up 4 lb, likely 2/2 perioperative fluids.  MAPs low initially, started on phenylephrine (may have been at least partly vagal as very diaphoretic with bleeding).  Now off  Phenylephrine and NE. MAPs 70s-90s. - Wt trending up, likely 2/2 perioperative IVFs and transfusions. Will resume spironolactone 25 mg daily. Continue to hold [facility] for now. - Hold ASA and warfarin with bleeding.  4. Smoking:He has cut back a lot, still taking Chantix. Encouraged to quit. 5. H/o RLE DVT: On warfarin for LVAD but held for wound exploration 6. OSA: Continue CPAP.  7. Hyperlipidemia: Atorvastatin.  8. Type II diabetes:SSI 9. Anemia, Acute blood loss: Hgb trending back down, 9.7 today. Monitor   I reviewed the LVAD parameters from today, and compared the results to the patient's prior recorded data.  No programming changes were made.  The LVAD is functioning within specified parameters.  The patient performs LVAD self-test daily.  LVAD interrogation was negative for any significant power changes, alarms or  PI events/speed drops.  LVAD equipment check completed and is in good working order.  Back-up equipment present.   LVAD education done on emergency procedures and precautions and reviewed exit site care.  Length of Stay: [address] [place] 04/20/2020, 10:49 AM  VAD Team --- VAD ISSUES ONLY--- Pager [phone] (7am - 7am)  Advanced Heart Failure Team  Pager [phone] (M-F; 7a - 4p)  Please contact [facility] for night-coverage after hours (4p -7a ) and weekends on [email or web address]  Patient seen with PA, agree with the above note.   Stable today.  MAP 70s-90s.  He remains on cefazolin and heparin gtt.    General: Well appearing this am. NAD.  HEENT: Normal. Neck: Supple, JVP 7-8 cm. Carotids OK.  Cardiac:  Mechanical heart sounds with LVAD hum present.  Lungs:  CTAB, normal effort.  Abdomen:  NT, ND, no HSM. No bruits or masses. +BS  LVAD exit site: Wound vac over lower sternal wound.  Extremities:  Warm and dry. No cyanosis, clubbing, rash, or  edema.  Neuro:  Alert & oriented x 3. Cranial nerves grossly intact. Moves all 4 extremities w/o difficulty. Affect pleasant    Continue low dose heparin and off warfarin.  Plan for OR to change wound vac Friday, then next week potentially for muscle flap closure.   Started spironolactone this morning, can restart low dose Entresto.  CVP 7.  Continue low dose heparin and off warfarin.  Plan for OR to change wound vac Friday, then next week potentially for muscle flap closure.   Hold Entresto for now, just stopped norepinephrine.    WBCs 18, suspect some of this is stress response post-op.  He is on cefazolin for MSSA bacteremia. Blood cultures repeated today, need to wait 72 hrs to ensure bacteremia is cleared before PICC placed.  Will remove CVL after OR trip tomorrow.   COVID-19+ but asymptomatic.  Repeat testing on 6/5 am and if negative, can discontinue precautions.   Marca Ancona 04/20/2020 3:20 PM

## 2020-04-20 NOTE — Progress Notes (Signed)
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   HM III LVAD implanted on 08/03/18 by Dr. Laneta Simmers under Destination Therapy criteria due to smoking status.  Pt awake, sitting up in bed, breakfast tray in front of pt. Denies complaints this am. Remains on isolation for positive Covid status.   Vital signs: Temp: 99.1 HR: 68 Doppler Pressure:  80 Auto cuff: 97/67 (78) O2 Sat: 100% 2 L/[place] Wt: 197>189>189>203.7>208.7 lbs   LVAD interrogation reveals:  Speed: 5500 Flow: 4.5 Power: 4.1w PI: 3.4  Alarms: none Events:  Few PI events Hematocrit: 35  Fixed speed: 5500 Low speed limit: 5200  Drive Line: gauze dressing C/D/I with anchor attached and accurately applied. Will advance to twice weekly dressing changes or as needed to keep site clean and dry. Pt has CHG allergy - clean with betadine. Next dressing will be done Friday in OR after sternal wound debridement.   Labs:  LDH trend: 182>194>140>252>191  INR trend: 2.5>1.9>1.5>1.4>1.3>1.2  Anticoagulation Plan: -INR Goal: 2.0 - 2.5 - warfarin on hold -ASA Dose: 81 mg daily - on hold  Blood Products:  - 04/16/20>FFP x 2; PRBC x 1 - 04/17/20>>FFP x 1; PRBC x 1  Intra-op: - 04/18/20>>PRBC x 6; Plts x 2; FFP x 2; Cell saver 2330 ml  Device: N/A  Respiratory: intubated intra op 04/18/20 - extubated 04/19/20 am  Infection:  - 04/16/20>>Covid positive - 04/16/20>>BCs staph aureus - 04/18/20>>sternal wound culture intra-op>>pending  Gtts: - Heparin 700 units/hr   Plan/Recommendations:  1. Call VAD Coordinator if any VAD equipment or drive line issues. 2. VAD Coordinator will accompany patient to OR Friday for wound VAD change per Dr. Maren Beach.  Hessie Diener RN, VAD Coordinator 24/7 VAD Pager: [phone]

## 2020-04-20 NOTE — Progress Notes (Signed)
2 Days Post-Op Procedure(s) (LRB): REPAIR OF LVAD (N/A) Debridement Abdominal Wound Application Of Wound Vac (N/A) Subjective: Heparin increased w/o change in VAC output Plan wound irrigation and VAC change in OR tomorrow VAD parameters are satisfactory  Objective: Vital signs in last 24 hours: Temp:  [98.1 F (36.7 C)-99.5 F (37.5 C)] 99.5 F (37.5 C) (06/03 1318) Pulse Rate:  [57-173] 76 (06/03 1318) Cardiac Rhythm: Normal sinus rhythm (06/03 0730) Resp:  [15-30] 16 (06/03 1318) BP: (74-125)/(44-97) 97/82 (06/03 1200) SpO2:  [80 %-100 %] 98 % (06/03 1318) Weight:  [94.7 kg] 94.7 kg (06/03 0600)  Hemodynamic parameters for last 24 hours:  stable  Intake/Output from previous day: 06/02 0701 - 06/03 0700 In: 442.6 [I.V.:175.8; IV Piggyback:266.8] Out: 2475 [Urine:2325; Drains:150] Intake/Output this shift: Total I/O In: 43.3 [I.V.:10; IV Piggyback:33.3] Out: 525 [Urine:525]       Exam    General- alert and comfortable    Neck- no JVD, no cervical adenopathy palpable, no carotid bruit   Lungs- clear without rales, wheezes   Cor- regular rate and rhythm, normal VAD hum   Abdomen- soft, non-tender   Extremities - warm, non-tender, minimal edema   Neuro- oriented, appropriate, no focal weakness   Lab Results: Recent Labs    04/19/20 0341 04/19/20 0350 04/19/20 0832 04/20/20 0500  WBC 18.0*  --   --  10.8*  HGB 11.9*   < > 10.5* 9.7*  HCT 35.4*   < > 31.0* 30.0*  PLT 168  --   --  151   < > = values in this interval not displayed.   BMET:  Recent Labs    04/19/20 0341 04/19/20 0350 04/19/20 0832 04/20/20 0500  NA 142   < > 144 140  K 4.6   < > 4.4 3.9  CL 109  --   --  106  CO2 25  --   --  28  GLUCOSE 168*  --   --  109*  BUN 13  --   --  10  CREATININE 1.03  --   --  0.76  CALCIUM 8.2*  --   --  8.3*   < > = values in this interval not displayed.    PT/INR:  Recent Labs    04/20/20 0500  LABPROT 14.5  INR 1.2   ABG    Component Value  Date/Time   PHART 7.464 (H) 04/19/2020 0832   HCO3 26.2 04/19/2020 0832   TCO2 27 04/19/2020 0832   ACIDBASEDEF 1.0 04/18/2020 1513   O2SAT 99.0 04/19/2020 0832   CBG (last 3)  Recent Labs    04/20/20 0519 04/20/20 0822 04/20/20 1138  GLUCAP 104* 148* 111*    Assessment/Plan: S/P Procedure(s) (LRB): REPAIR OF LVAD (N/A) Debridement Abdominal Wound Application Of Wound Vac (N/A) DC heparin in am for wound care in the oR   LOS: 4 days    [name] 04/20/2020

## 2020-04-21 ENCOUNTER — Inpatient Hospital Stay (HOSPITAL_COMMUNITY): Payer: Medicare HMO | Admitting: Certified Registered Nurse Anesthetist

## 2020-04-21 ENCOUNTER — Encounter (HOSPITAL_COMMUNITY)
Admission: EM | Disposition: A | Discharge status: Home / Self Care | Payer: Self-pay | Source: Home / Self Care | Attending: Cardiology

## 2020-04-21 DIAGNOSIS — I9789 Other postprocedural complications and disorders of the circulatory system, not elsewhere classified: Secondary | ICD-10-CM

## 2020-04-21 HISTORY — PX: APPLICATION OF WOUND VAC: SHX5189

## 2020-04-21 LAB — CULTURE, BLOOD (ROUTINE X 2)
Culture: NO GROWTH
Special Requests: ADEQUATE

## 2020-04-21 LAB — BASIC METABOLIC PANEL
Anion gap: 10 (ref 5–15)
BUN: 8 mg/dL (ref 8–23)
CO2: 28 mmol/L (ref 22–32)
Calcium: 8.6 mg/dL — ABNORMAL LOW (ref 8.9–10.3)
Chloride: 105 mmol/L (ref 98–111)
Creatinine, Ser: 0.69 mg/dL (ref 0.61–1.24)
GFR calc Af Amer: >60 mL/min (ref >60)
GFR calc non Af Amer: >60 mL/min (ref >60)
Glucose, Bld: 121 mg/dL — ABNORMAL HIGH (ref 70–99)
Potassium: 3.8 mmol/L (ref 3.5–5.1)
Sodium: 143 mmol/L (ref 135–145)

## 2020-04-21 LAB — PROTIME-INR
INR: 1.1 (ref 0.8–1.2)
Prothrombin Time: 13.7 seconds (ref 11.4–15.2)

## 2020-04-21 LAB — CBC
HCT: 28.5 % — ABNORMAL LOW (ref 39.0–52.0)
Hemoglobin: 9.1 g/dL — ABNORMAL LOW (ref 13.0–17.0)
MCH: 29.6 pg (ref 26.0–34.0)
MCHC: 31.9 g/dL (ref 30.0–36.0)
MCV: 92.8 fL (ref 80.0–100.0)
Platelets: 173 10*3/uL (ref 150–400)
RBC: 3.07 MIL/uL — ABNORMAL LOW (ref 4.22–5.81)
RDW: 14.1 % (ref 11.5–15.5)
WBC: 7.6 10*3/uL (ref 4.0–10.5)
nRBC: 0 % (ref 0.0–0.2)

## 2020-04-21 LAB — GLUCOSE, CAPILLARY
Glucose-Capillary: 104 mg/dL — ABNORMAL HIGH (ref 70–99)
Glucose-Capillary: 170 mg/dL — ABNORMAL HIGH (ref 70–99)

## 2020-04-21 LAB — LACTATE DEHYDROGENASE: LDH: 162 U/L (ref 98–192)

## 2020-04-21 LAB — HEPARIN LEVEL (UNFRACTIONATED): Heparin Unfractionated: <0.1 IU/mL — ABNORMAL LOW (ref 0.30–0.70)

## 2020-04-21 SURGERY — APPLICATION, WOUND VAC
Anesthesia: General

## 2020-04-21 MED ORDER — ETOMIDATE 2 MG/ML IV SOLN
INTRAVENOUS | Status: DC | PRN
  Administered 2020-04-21: 14 mg via INTRAVENOUS

## 2020-04-21 MED ORDER — MIDAZOLAM HCL 2 MG/2ML IJ SOLN
INTRAMUSCULAR | Status: AC
  Filled 2020-04-21: qty 2

## 2020-04-21 MED ORDER — LIDOCAINE 2% (20 MG/ML) 5 ML SYRINGE
INTRAMUSCULAR | Status: DC | PRN
  Administered 2020-04-21: 60 mg via INTRAVENOUS

## 2020-04-21 MED ORDER — DAKINS (FULL STRENGTH) SOLUTION 0.5%
CUTANEOUS | Status: DC
  Filled 2020-04-21: qty 1000

## 2020-04-21 MED ORDER — PHENYLEPHRINE HCL-NACL 10-0.9 MG/250ML-% IV SOLN
INTRAVENOUS | Status: DC | PRN
  Administered 2020-04-21: 50 ug/min via INTRAVENOUS

## 2020-04-21 MED ORDER — LIDOCAINE 2% (20 MG/ML) 5 ML SYRINGE
INTRAMUSCULAR | Status: AC
  Filled 2020-04-21: qty 5

## 2020-04-21 MED ORDER — SACUBITRIL-VALSARTAN 49-51 MG PO TABS
1.0000 | ORAL_TABLET | Freq: Two times a day (BID) | ORAL | Status: DC
  Administered 2020-04-21 – 2020-04-22 (×3): 1 via ORAL
  Filled 2020-04-21 (×5): qty 1

## 2020-04-21 MED ORDER — LACTATED RINGERS IV SOLN: INTRAVENOUS | Status: DC | PRN

## 2020-04-21 MED ORDER — SODIUM CHLORIDE 0.9 % IR SOLN
Status: DC | PRN
  Administered 2020-04-21: 2000 mL

## 2020-04-21 MED ORDER — ONDANSETRON HCL 4 MG/2ML IJ SOLN
INTRAMUSCULAR | Status: DC | PRN
  Administered 2020-04-21: 4 mg via INTRAVENOUS

## 2020-04-21 MED ORDER — ETOMIDATE 2 MG/ML IV SOLN
INTRAVENOUS | Status: AC
  Filled 2020-04-21: qty 10

## 2020-04-21 MED ORDER — FENTANYL CITRATE (PF) 100 MCG/2ML IJ SOLN
INTRAMUSCULAR | Status: DC | PRN
  Administered 2020-04-21 (×3): 50 ug via INTRAVENOUS
  Administered 2020-04-21: 100 ug via INTRAVENOUS
  Administered 2020-04-21 (×2): 50 ug via INTRAVENOUS

## 2020-04-21 MED ORDER — ROCURONIUM BROMIDE 10 MG/ML (PF) SYRINGE
PREFILLED_SYRINGE | INTRAVENOUS | Status: DC | PRN
  Administered 2020-04-21: 50 mg via INTRAVENOUS

## 2020-04-21 MED ORDER — PHENYLEPHRINE 40 MCG/ML (10ML) SYRINGE FOR IV PUSH (FOR BLOOD PRESSURE SUPPORT)
PREFILLED_SYRINGE | INTRAVENOUS | Status: DC | PRN
  Administered 2020-04-21: 160 ug via INTRAVENOUS

## 2020-04-21 MED ORDER — CEFAZOLIN SODIUM-DEXTROSE 2-4 GM/100ML-% IV SOLN
INTRAVENOUS | Status: AC
  Filled 2020-04-21: qty 100

## 2020-04-21 MED ORDER — VANCOMYCIN HCL 1000 MG IV SOLR
INTRAVENOUS | Status: DC | PRN
  Administered 2020-04-21: 1000 mL

## 2020-04-21 MED ORDER — ROCURONIUM BROMIDE 10 MG/ML (PF) SYRINGE
PREFILLED_SYRINGE | INTRAVENOUS | Status: AC
  Filled 2020-04-21: qty 10

## 2020-04-21 MED ORDER — SUGAMMADEX SODIUM 200 MG/2ML IV SOLN
INTRAVENOUS | Status: DC | PRN
  Administered 2020-04-21: 200 mg via INTRAVENOUS

## 2020-04-21 MED ORDER — DAKINS (1/2 STRENGTH) 0.25 % EX SOLN
CUTANEOUS | Status: AC
  Filled 2020-04-21 (×2): qty 473

## 2020-04-21 MED ORDER — DEXAMETHASONE SODIUM PHOSPHATE 10 MG/ML IJ SOLN
INTRAMUSCULAR | Status: DC | PRN
  Administered 2020-04-21: 10 mg via INTRAVENOUS

## 2020-04-21 MED ORDER — PROPOFOL 10 MG/ML IV BOLUS
INTRAVENOUS | Status: AC
  Filled 2020-04-21: qty 20

## 2020-04-21 MED ORDER — DEXAMETHASONE SODIUM PHOSPHATE 10 MG/ML IJ SOLN
INTRAMUSCULAR | Status: AC
  Filled 2020-04-21: qty 1

## 2020-04-21 MED ORDER — SODIUM CHLORIDE 0.9 % IV SOLN
20.0000 ug | Freq: Once | INTRAVENOUS | Status: DC
  Filled 2020-04-21 (×2): qty 5

## 2020-04-21 MED ORDER — ONDANSETRON HCL 4 MG/2ML IJ SOLN
INTRAMUSCULAR | Status: AC
  Filled 2020-04-21: qty 2

## 2020-04-21 MED ORDER — HEPARIN (PORCINE) 25000 UT/250ML-% IV SOLN
900.0000 [IU]/h | INTRAVENOUS | Status: DC
  Administered 2020-04-22: 700 [IU]/h via INTRAVENOUS
  Administered 2020-04-24: 900 [IU]/h via INTRAVENOUS
  Filled 2020-04-21 (×5): qty 250

## 2020-04-21 MED ORDER — MIDAZOLAM HCL 5 MG/5ML IJ SOLN
INTRAMUSCULAR | Status: DC | PRN
  Administered 2020-04-21: 2 mg via INTRAVENOUS

## 2020-04-21 MED ORDER — FENTANYL CITRATE (PF) 250 MCG/5ML IJ SOLN
INTRAMUSCULAR | Status: AC
  Filled 2020-04-21: qty 5

## 2020-04-21 MED ORDER — SUCCINYLCHOLINE CHLORIDE 200 MG/10ML IV SOSY
PREFILLED_SYRINGE | INTRAVENOUS | Status: AC
  Filled 2020-04-21: qty 10

## 2020-04-21 MED ORDER — SUCCINYLCHOLINE CHLORIDE 200 MG/10ML IV SOSY
PREFILLED_SYRINGE | INTRAVENOUS | Status: DC | PRN
  Administered 2020-04-21: 120 mg via INTRAVENOUS

## 2020-04-21 MED FILL — Heparin Sodium (Porcine) Inj 1000 Unit/ML: INTRAMUSCULAR | Qty: 20 | Status: AC

## 2020-04-21 MED FILL — Potassium Chloride Inj 2 mEq/ML: INTRAVENOUS | Qty: 40 | Status: AC

## 2020-04-21 MED FILL — Vancomycin HCl IV Soln 1500 MG/300ML (Base Equivalent): INTRAVENOUS | Qty: 300 | Status: AC

## 2020-04-21 MED FILL — Mannitol IV Soln 20%: INTRAVENOUS | Qty: 500 | Status: AC

## 2020-04-21 MED FILL — Heparin Sodium (Porcine) Inj 1000 Unit/ML: INTRAMUSCULAR | Qty: 30 | Status: AC

## 2020-04-21 MED FILL — Calcium Chloride Inj 10%: INTRAVENOUS | Qty: 10 | Status: AC

## 2020-04-21 MED FILL — Electrolyte-R (PH 7.4) Solution: INTRAVENOUS | Qty: 5000 | Status: AC

## 2020-04-21 MED FILL — Sodium Chloride IV Soln 0.9%: INTRAVENOUS | Qty: 4000 | Status: AC

## 2020-04-21 MED FILL — Sodium Bicarbonate IV Soln 8.4%: INTRAVENOUS | Qty: 50 | Status: AC

## 2020-04-21 SURGICAL SUPPLY — 56 items
BENZOIN TINCTURE PRP APPL 2/3 (GAUZE/BANDAGES/DRESSINGS) IMPLANT
BLADE CLIPPER SURG (BLADE) ×3 IMPLANT
BLADE SURG 10 STRL SS (BLADE) IMPLANT
BLADE SURG 15 STRL LF DISP TIS (BLADE) IMPLANT
BLADE SURG 15 STRL SS (BLADE)
BNDG GAUZE ELAST 4 BULKY (GAUZE/BANDAGES/DRESSINGS) IMPLANT
CANISTER SUCT 3000ML PPV (MISCELLANEOUS) ×3 IMPLANT
CANISTER WOUND CARE 500ML ATS (WOUND CARE) ×3 IMPLANT
CLIP VESOCCLUDE SM WIDE 24/CT (CLIP) IMPLANT
CNTNR URN SCR LID CUP LEK RST (MISCELLANEOUS) IMPLANT
CONT SPEC 4OZ STRL OR WHT (MISCELLANEOUS)
DRAPE LAPAROSCOPIC ABDOMINAL (DRAPES) ×3 IMPLANT
DRAPE SLUSH/WARMER DISC (DRAPES) IMPLANT
DRSG PAD ABDOMINAL 8X10 ST (GAUZE/BANDAGES/DRESSINGS) IMPLANT
DRSG TEGADERM 4X4.5 CHG (GAUZE/BANDAGES/DRESSINGS) ×3 IMPLANT
DRSG VAC ATS LRG SENSATRAC (GAUZE/BANDAGES/DRESSINGS) ×3 IMPLANT
DRSG VAC ATS MED SENSATRAC (GAUZE/BANDAGES/DRESSINGS) ×3 IMPLANT
DRSG VAC ATS SM SENSATRAC (GAUZE/BANDAGES/DRESSINGS) ×3 IMPLANT
DRSG VERSA FOAM LRG 10X15 (GAUZE/BANDAGES/DRESSINGS) ×3 IMPLANT
ELECT REM PT RETURN 9FT ADLT (ELECTROSURGICAL) ×3
ELECTRODE REM PT RTRN 9FT ADLT (ELECTROSURGICAL) ×1 IMPLANT
GAUZE PACKING IODOFORM 1X5 (PACKING) ×3 IMPLANT
GAUZE SPONGE 4X4 12PLY STRL (GAUZE/BANDAGES/DRESSINGS) IMPLANT
GAUZE SPONGE 4X4 12PLY STRL LF (GAUZE/BANDAGES/DRESSINGS) ×3 IMPLANT
GAUZE XEROFORM 5X9 LF (GAUZE/BANDAGES/DRESSINGS) IMPLANT
GLOVE BIO SURGEON STRL SZ7.5 (GLOVE) ×6 IMPLANT
GOWN STRL REUS W/ TWL LRG LVL3 (GOWN DISPOSABLE) ×2 IMPLANT
GOWN STRL REUS W/TWL LRG LVL3 (GOWN DISPOSABLE) ×6
HANDPIECE INTERPULSE COAX TIP (DISPOSABLE) ×3
HEMOSTAT POWDER SURGIFOAM 1G (HEMOSTASIS) IMPLANT
HEMOSTAT SURGICEL 2X14 (HEMOSTASIS) IMPLANT
KIT BASIN OR (CUSTOM PROCEDURE TRAY) ×3 IMPLANT
KIT SUCTION CATH 14FR (SUCTIONS) IMPLANT
KIT TURNOVER KIT B (KITS) ×3 IMPLANT
NS IRRIG 1000ML POUR BTL (IV SOLUTION) ×3 IMPLANT
PACK GENERAL/GYN (CUSTOM PROCEDURE TRAY) ×3 IMPLANT
PAD ARMBOARD 7.5X6 YLW CONV (MISCELLANEOUS) ×6 IMPLANT
SET HNDPC FAN SPRY TIP SCT (DISPOSABLE) ×1 IMPLANT
SPONGE LAP 4X18 RFD (DISPOSABLE) ×3 IMPLANT
STAPLER VISISTAT 35W (STAPLE) IMPLANT
SUT ETHIBOND 2 0 SH (SUTURE) ×6
SUT ETHIBOND 2 0 SH 36X2 (SUTURE) ×2 IMPLANT
SUT ETHILON 3 0 FSL (SUTURE) IMPLANT
SUT PROLENE 4 0 RB 1 (SUTURE) ×3
SUT PROLENE 4-0 RB1 .5 CRCL 36 (SUTURE) ×1 IMPLANT
SUT VIC AB 1 CTX 36 (SUTURE)
SUT VIC AB 1 CTX36XBRD ANBCTR (SUTURE) IMPLANT
SUT VIC AB 2-0 CTX 27 (SUTURE) IMPLANT
SUT VIC AB 3-0 X1 27 (SUTURE) IMPLANT
SWAB COLLECTION DEVICE MRSA (MISCELLANEOUS) ×3 IMPLANT
SWAB CULTURE ESWAB REG 1ML (MISCELLANEOUS) ×3 IMPLANT
TAPE CLOTH SURG 4X10 WHT LF (GAUZE/BANDAGES/DRESSINGS) ×3 IMPLANT
TOWEL GREEN STERILE (TOWEL DISPOSABLE) ×3 IMPLANT
TOWEL GREEN STERILE FF (TOWEL DISPOSABLE) ×3 IMPLANT
TRAY FOLEY MTR SLVR 16FR STAT (SET/KITS/TRAYS/PACK) IMPLANT
WATER STERILE IRR 1000ML POUR (IV SOLUTION) ×3 IMPLANT

## 2020-04-21 NOTE — Anesthesia Procedure Notes (Signed)
Procedure Name: Intubation Date/Time: 04/21/2020 3:30 PM Performed by: Rosiland Oz, CRNA Pre-anesthesia Checklist: Patient identified, Emergency Drugs available, Suction available, Patient being monitored and Timeout performed Patient Re-evaluated:Patient Re-evaluated prior to induction Oxygen Delivery Method: Circle system utilized Preoxygenation: Pre-oxygenation with 100% oxygen Induction Type: IV induction and Rapid sequence Laryngoscope Size: Miller and 3 Grade View: Grade I Tube type: Oral Tube size: 7.5 mm Number of attempts: 1 Airway Equipment and Method: Stylet Placement Confirmation: ETT inserted through vocal cords under direct vision,  positive ETCO2 and breath sounds checked- equal and bilateral Secured at: 22 cm Tube secured with: Tape

## 2020-04-21 NOTE — Brief Op Note (Signed)
04/21/2020  4:38 PM  PATIENT:  Theodore Welch  59 y.o. male  PRE-OPERATIVE DIAGNOSIS:  VAD WOUND INFECTION  POST-OPERATIVE DIAGNOSIS:  VAD WOUND INFECTION  PROCEDURE:  Procedure(s): WOUND VAC CHANGE (N/A) Irrigation of power cord tunnel wound and VAC change Sharp Debridement of power cord exit site SURGEON:  Surgeon(s) and Role:    Kerin Perna, MD - Primary  PHYSICIAN ASSISTANT:   ASSISTANTS: none   ANESTHESIA:   general  EBL:  5 cc   BLOOD ADMINISTERED:none  DRAINS: wound vac to upper abdominal wound   LOCAL MEDICATIONS USED:  NONE  SPECIMEN:  Scraping  DISPOSITION OF SPECIMEN:  microbiology  COUNTS:  YES  TOURNIQUET:  * No tourniquets in log *  DICTATION: .Dragon Dictation  PLAN OF CARE: return to 2H bed  PATIENT DISPOSITION:  ICU - extubated and stable.   Delay start of Pharmacological VTE agent (>24hrs) due to surgical blood loss or risk of bleeding: yes Resume heparin in 8 hours - 12 midnight

## 2020-04-21 NOTE — Progress Notes (Addendum)
VAD Coordinator Procedure Note:   VAD Coordinator met patient in 2C23. Pt undergoing wound vac change and LVAD drive line debridement per Dr. Darcey Nora today. Hemodynamics and VAD parameters monitored by myself and anesthesia throughout the procedure. Blood pressures were obtained with automatic cuff on left arm. Pt taken directly to OR 15 due to positive Covid status.         Time: Auto  BP Flow PI Power Speed  15:45 105/80 (89) 4.4 3.2 4.0 5500  16:00 118/95 (101) 3.9 5.9 4.0   16:15 103/89 (95) 3.7 5.9 4.0   16:30 128/101 (113) 3.7 6.0 4.0   16:45 117/75 (86) 4.0 5.1 5.0    [facility]) updated by Dr. Darcey Nora.   Pt extubated in OR per anesthesia. Foley cath dc'd at 16:30 as directed by Dr. Darcey Nora.    VAD Coordinator accompanied patient to [place] along with OR team. Pt fully awake, alert, oriented to person, time, place. MOE x 4; moving self from bed to bed; wanting to walk. Transferred patient to 63C01.  Large tegaderm applied over DL exit site dressing. Updated BS nurse Dr. Darcey Nora wants daily dressing changes using daily kits; pack site with 1" iodoform gauze, then cover with large tegaderm. BS nurse may change this dressing; order updated.   Patient Disposition: 8T15 - 7W62  Zada Girt RN, VAD Coordinator 24/7 VAD Pager: [phone]

## 2020-04-21 NOTE — Progress Notes (Signed)
Patient received from OR to 2H23 accompanied by Anesthesia.  Patient is alert and oriented x4.  BP, O2, and RR are all within normal limits.  Patient is ready for transfer to [facility].  Malva Limes RN

## 2020-04-21 NOTE — Progress Notes (Signed)
Pre Procedure note for inpatients:   Theodore Welch has been scheduled for Procedure(s): REPAIR OF LVAD (N/A) Debridement Abdominal Wound Application Of Wound Vac (N/A) today. The various methods of treatment have been discussed with the patient. After consideration of the risks, benefits and treatment options the patient has consented to the planned procedure.   The patient has been seen and labs reviewed. There are no changes in the patient's condition to prevent proceeding with the planned procedure today.  Recent labs:  Lab Results  Component Value Date   WBC 7.6 04/21/2020   HGB 9.1 (L) 04/21/2020   HCT 28.5 (L) 04/21/2020   PLT 173 04/21/2020   GLUCOSE 121 (H) 04/21/2020   CHOL 97 07/21/2018   TRIG 39 07/21/2018   HDL 32 (L) 07/21/2018   LDLCALC 57 07/21/2018   ALT 13 04/18/2020   AST 24 04/18/2020   NA 143 04/21/2020   K 3.8 04/21/2020   CL 105 04/21/2020   CREATININE 0.69 04/21/2020   BUN 8 04/21/2020   CO2 28 04/21/2020   TSH 0.880 07/24/2018   INR 1.1 04/21/2020   HGBA1C 6.2 (H) 04/16/2020    Theodore Bussing, MD 04/21/2020 7:38 AM

## 2020-04-21 NOTE — Transfer of Care (Signed)
Immediate Anesthesia Transfer of Care Note  Patient: Theodore Welch  Procedure(s) Performed: WOUND VAC CHANGE (N/A )  Patient Location: SICU  Anesthesia Type:General  Level of Consciousness: awake and patient cooperative  Airway & Oxygen Therapy: Patient Spontanous Breathing  Post-op Assessment: Report given to RN and Post -op Vital signs reviewed and stable  Post vital signs: Reviewed and stable  Last Vitals:  Vitals Value Taken Time  BP    Temp    Pulse    Resp    SpO2      Last Pain:  Vitals:   04/21/20 0800  TempSrc: Bladder  PainSc: 0-No pain      Patients Stated Pain Goal: 0 (04/20/20 2200)  Complications: No apparent anesthesia complications

## 2020-04-21 NOTE — Op Note (Signed)
NAME: Theodore Welch, Theodore Welch MEDICAL RECORD VQ:[id number] ACCOUNT [id number] DATE OF BIRTH:01-Nov-1961 FACILITY: [facility] LOCATION: [facility] PHYSICIAN:Novalee Horsfall VAN TRIGT III, MD  OPERATIVE REPORT  DATE OF PROCEDURE:  04/21/2020  SURGEON:  [name], MD  OPERATION: 1.  Wound irrigation and wound VAC change of abdominal ventricular assist device power cord tunnel wound. 2.  Excisional debridement of ventricular assist device power cord exit site.  DESCRIPTION OF PROCEDURE:  After informed consent was documented and final issues addressed with the patient, the patient was brought to the operating room and placed supine on the operating table where general anesthesia was induced.  For the entire  procedure, the VAD coordinator was present to monitor the VAD equipment and functioning and to help manage with hemodynamics.  The patient was prepped and draped as a sterile field after the black sponge had been removed from the upper abdominal wound.  The wound was inspected.  It was clean without purulence.  The deeper white sponge was then removed.  The wound was irrigated  with vancomycin irrigation.  The exposed power cord was clean.  The exposed outflow graft segment was also clean without bleeding.  After irrigation, a new white sponge was placed on the outflow graft and then a larger black sponge placed to fill the  rest of the wound and sterile dressings where the wound VAC sheaths were placed and connected to suction.  Next, attention was directed to the power cord exit site.  There was chronically inflamed and indolent- appearing infection of the exit site, even though the power cord was totally incorporated.  There was some granulation tissue as well.  The skin and  scarred unhealthy tissue was sharply debrided around the power cord.  Hemostasis was achieved.  This was also irrigated with vancomycin irrigation and the wound was packed with iodoform, followed by dry dressings and a sterile  sheath cover.  The patient  was then reversed from anesthesia, extubated and returned to the ICU in stable condition.  Blood loss was minimal.  The next step will be to assess the patient for muscle flap to cover the upper abdominal power cord tunnel wound in conjunction with reconstructive plastic surgery.  VN/NUANCE  D:04/21/2020 T:04/21/2020 JOB:[id number]

## 2020-04-21 NOTE — Plan of Care (Signed)

## 2020-04-21 NOTE — Progress Notes (Signed)
Advanced Heart Failure VAD Team Note  PCP-Cardiologist: No primary care provider on file.   Subjective:    Here w/ subxiphoid abscess and cellulitis + bleeding from wound site. Admit Hgb 7.4. COVID +   S/p multiple transfusions. Has received total of 3 units PRBCs and 5 units FFP. Hgb stable at 9.1 today.   Blood cultures 1/2 positive for MSSA.  On cefazolin.  WBC improving, 18>>10>>7.6 . AF past 24 hrs. ID following.  Went to OR 6/1 for I&D of chest wound + repair of VAD outflow graft. Required fem-fem cardiopulmonary bypass. Wound vac placed.   LVAD INTERROGATION:  HeartMate III LVAD:   Flow 4.1   liters/min, speed 5500, power 4 PI 4.1      Objective:    Vital Signs:   Temp:  [98.4 F (36.9 C)-100.2 F (37.9 C)] 99 F (37.2 C) (06/04 0800) Pulse Rate:  [61-84] 63 (06/04 0800) Resp:  [15-25] 25 (06/04 0800) BP: (91-135)/(73-106) 99/84 (06/04 0800) SpO2:  [90 %-100 %] 100 % (06/04 0800) Weight:  [94.2 kg] 94.2 kg (06/04 0500) Last BM Date: 04/20/20 Mean arterial Pressure 90s Intake/Output:   Intake/Output Summary (Last 24 hours) at 04/21/2020 0951 Last data filed at 04/21/2020 0800 Gross per 24 hour  Intake 710.83 ml  Output 5225 ml  Net -4514.17 ml     Physical Exam    GENERAL: NAD HEENT: normal  NECK: Supple, JVP not elevated.  2+ bilaterally, no bruits.  No lymphadenopathy or thyromegaly appreciated.   CARDIAC:  Mechanical heart sounds with LVAD hum present.  LUNGS:  Clear to auscultation bilaterally.  ABDOMEN:  Soft, round, nontender, positive bowel sounds x4.     LVAD exit site:  Dressing dry and intact.  No erythema or drainage.  Stabilization device present and accurately applied.   EXTREMITIES:  Warm and dry, no cyanosis, clubbing, rash or edema  NEUROLOGIC:  Alert and oriented x 3.  No aphasia.  No dysarthria.  Affect pleasant.      Telemetry  NSR 70-80s   EKG   n/a   Labs   Basic Metabolic Panel: Recent Labs  Lab 04/18/20 0401 04/18/20 0401  04/18/20 1155 04/18/20 1517 04/18/20 1709 04/18/20 1709 04/19/20 0341 04/19/20 0350 04/19/20 0832 04/20/20 0500 04/21/20 0525  NA 141   < >   < > 142 143   < > 142 143 144 140 143  K 4.1   < >   < > 3.8 3.9   < > 4.6 4.5 4.4 3.9 3.8  CL 102  --    < > 102 109  --  109  --   --  106 105  CO2 24  --   --   --  25  --  25  --   --  28 28  GLUCOSE 103*  --    < > 154* 80  --  168*  --   --  109* 121*  BUN 11  --    < > 11 11  --  13  --   --  10 8  CREATININE 0.86  --    < > 0.60* 0.79  --  1.03  --   --  0.76 0.69  CALCIUM 9.4   < >  --   --  7.6*   < > 8.2*  --   --  8.3* 8.6*   < > = values in this interval not displayed.    Liver Function Tests: Recent Labs  Lab 04/16/20 0408 04/18/20 1709  AST 19 24  ALT 14 13  ALKPHOS 105 54  BILITOT 0.6 1.6*  PROT 7.1 4.6*  ALBUMIN 3.4* 2.3*   No results for input(s): LIPASE, AMYLASE in the last 168 hours. No results for input(s): AMMONIA in the last 168 hours.  CBC: Recent Labs  Lab 04/16/20 0408 04/17/20 0059 04/18/20 0401 04/18/20 1155 04/18/20 1709 04/18/20 1709 04/19/20 0341 04/19/20 0350 04/19/20 0832 04/20/20 0500 04/21/20 0525  WBC 16.6*   < > 8.3  --  18.5*  --  18.0*  --   --  10.8* 7.6  NEUTROABS 10.9*  --   --   --   --   --   --   --   --   --   --   HGB 10.5*   < > 8.2*   < > 13.5   < > 11.9* 11.2* 10.5* 9.7* 9.1*  HCT 33.6*   < > 24.9*   < > 39.3   < > 35.4* 33.0* 31.0* 30.0* 28.5*  MCV 92.3   < > 87.7  --  87.1  --  88.7  --   --  91.7 92.8  PLT 424*   < > 234  --  118*  --  168  --   --  151 173   < > = values in this interval not displayed.    INR: Recent Labs  Lab 04/18/20 0401 04/18/20 1709 04/19/20 0341 04/20/20 0500 04/21/20 0525  INR 1.5* 1.4* 1.3* 1.2 1.1    Other results:  EKG:    Imaging   DG Chest Port 1 View  Result Date: 04/20/2020 CLINICAL DATA:  Left ventricular assist device. EXAM: PORTABLE CHEST 1 VIEW COMPARISON:  04/20/2019 FINDINGS: The LVAD is in stable position. No  complicating features are demonstrated. The right IJ central venous catheter is stable. The endotracheal tube and NG tubes have been removed. Stable cardiac enlargement.  No acute pulmonary findings. IMPRESSION: 1. Removal of endotracheal and NG tubes. 2. Stable LVAD. 3. No acute pulmonary findings. Electronically Signed   By: Rudie Meyer M.D.   On: 04/20/2020 09:38     Medications:     Scheduled Medications: . gabapentin  600 mg Oral QID  . insulin aspart  0-15 Units Subcutaneous TID WC  . insulin aspart  0-5 Units Subcutaneous QHS  . mupirocin cream   Topical BID  . pantoprazole  40 mg Oral Daily  . spironolactone  25 mg Oral Daily  . thiamine  100 mg Oral Daily  . varenicline  1 mg Oral Daily  . vitamin B-12  1,000 mcg Oral Daily  . zinc sulfate  220 mg Oral BID    Infusions: .  ceFAZolin (ANCEF) IV Stopped (04/21/20 0622)  .  ceFAZolin (ANCEF) IV    . dextrose 5 % and 0.45% NaCl 50 mL/hr at 04/21/20 0800  . heparin 700 Units/hr (04/21/20 0800)  . norepinephrine (LEVOPHED) Adult infusion Stopped (04/19/20 1110)    PRN Medications: acetaminophen, fentaNYL (SUBLIMAZE) injection, fluticasone, hydrALAZINE, Ipratropium-Albuterol, ondansetron (ZOFRAN) IV, oxyCODONE, sodium chloride flush, zolpidem   Assessment/Plan:    1. 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 He previously required debridement of lower sternalwound with woundVAC therapy and IV antibiotics.This was completed with 3 surgical procedures including debridement and ACell application in March 2021.  More recently, the lower sternal wound reopened with drainage again culturing MSSA and he had been on cephalexin +  doxycycline.  The day of admission, he developed persistent bleeding from the wound site and came to ER, bleeding now stopped with suture.  Bleeding again 5/30, now stopped again. Got an additional 1 units PRBCs on 5/31 and again 6/1. Overall he has  received 3 units PRBCs + 5 units FFP. ID consulted and switched antibiotics to cefazolin.  1/2 blood cultures + for MSSA.  WBC trending down, 18>>10>>7.6 . AF past 24 hr.  - Continue abx per ID, cefazolin.   - CT chest/abd/pelvis--> Stable small amount of soft tissue tracking along the proximal drive line in the subcutaneous ventral left abdominal wall.  - s/p I&D + wound vac placement on 6/1. Plan return to OR 6/4 for wound vac replacement and potentially muscle flap closure next week  - Coumadin on hold for procedures. Continue IV heparin.  2. COVID-19 infection: Asymptomatic. No cough/dyspnea.  WBCS 16.6-->15.2->8.3->18->10>7.6.   AF past 24 hs.  Ferritin normal, D dimer 0.96.  LDH stable 162  - Discussed monoclonal antibody infusion with Dr. Lake Bells => hold off for now as not clear that he is actively infected. His last test was barely positive ( in the non-reproducible, non-infectious range) - Per ID, will repeat PCR testing on 6/5. If negative, can discontinue off of isolation   3. 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 LVAD parameters stable.  Has been NYHA class II. He does not look grossly volume overloaded but wt up 4 lb, likely 2/2 perioperative fluids.  MAPs low initially, started on phenylephrine (may have been at least partly vagal as very diaphoretic with bleeding).  Now off phenylephrine and NE.  - Continue spironolactone 25 mg daily.  - Can start Entresto 49/51 bid.  - Hold ASA and warfarin with bleeding.  4. Smoking:He has cut back a lot, still taking Chantix. Encouraged to quit. 5. H/o RLE DVT: On warfarin for LVAD but held for wound exploration 6. OSA: Continue CPAP.  7. Hyperlipidemia: Atorvastatin.  8. Type II diabetes:SSI 9. Anemia, Acute blood loss: Hgb 9.1 today.    I reviewed the LVAD parameters from today, and compared the results to the patient's prior recorded data.  No programming changes were made.  The LVAD is  functioning within specified parameters.  The patient performs LVAD self-test daily.  LVAD interrogation was negative for any significant power changes, alarms or PI events/speed drops.  LVAD equipment check completed and is in good working order.  Back-up equipment present.   LVAD education done on emergency procedures and precautions and reviewed exit site care.  Length of Stay: 5  Loralie Champagne 04/21/2020 1:40 PM  VAD Team --- VAD ISSUES ONLY--- Pager [phone] (7am - 7am)  Advanced Heart Failure Team  Pager [phone] (M-F; 7a - 4p)  Please contact [facility] for night-coverage after hours (4p -7a ) and weekends on [email or web address]

## 2020-04-21 NOTE — Progress Notes (Addendum)
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   HM III LVAD implanted on 08/03/18 by Dr. Laneta Simmers under Destination Therapy criteria due to smoking status.  Pt awake, sitting up in bed, denies complaints other than being hungry for "real food". Remains on isolation for positive Covid status.   Vital signs: Temp: 98.8 HR: 63 Doppler Pressure:  114 Auto cuff: 99/84 (91) O2 Sat: 100% 2 L/[place] Wt: 197>189>189>203.7>208.7>207.6 lbs   LVAD interrogation reveals:  Speed: 5500 Flow: 3.8 Power: 4.0w PI: 5.2  Alarms: none Events:  Few PI Hematocrit: 28  Fixed speed: 5500 Low speed limit: 5200  Drive Line: gauze dressing C/D/I with anchor attached and accurately applied. Twice weekly dressing changes or as needed to keep site clean and dry. Pt has CHG allergy - clean with betadine. Next dressing will be done Friday in OR after sternal wound debridement.   Labs:  LDH trend: 182>194>140>252>191>162  INR trend: 2.5>1.9>1.5>1.4>1.3>1.2>1.1  Anticoagulation Plan: -INR Goal: 2.0 - 2.5 - warfarin on hold -ASA Dose: 81 mg daily - on hold  Blood Products:  - 04/16/20>FFP x 2; PRBC x 1 - 04/17/20>>FFP x 1; PRBC x 1  Intra-op: - 04/18/20>>PRBC x 6; Plts x 2; FFP x 2; Cell saver 2330 ml  Device: N/A  Respiratory: intubated intra op 04/18/20 - extubated 04/19/20 am  Infection:  - 04/16/20>>Covid positive - 04/16/20>>BCs staph aureus - 04/18/20>>sternal wound culture intra-op>>staph aureus - 04/20/20>>BCs pending  Gtts: - Heparin 700 units/hr   Plan/Recommendations:  1. Call VAD Coordinator if any VAD equipment or drive line issues. 2. VAD Coordinator will accompany patient to OR Friday for wound VAD change per Dr. Maren Beach.  Hessie Diener RN, VAD Coordinator 24/7 VAD Pager: [phone]

## 2020-04-21 NOTE — Progress Notes (Signed)
ID PROGRESS NOTE   Theodore Welch underwent wound vac change, and prelim report suggest no reaccumulation of purulence in the wound bed. He remains afebrile.  A/P: 60yo M with MSSA LVAD driveline infection, with subxyphoid abscess and bacteremia s/p washout, now with wound vac  1) MSSA bacteremia = we have not documented if he has cleared his bloodstream infection. Blood cx from 6/3 are NGTD < 24hr. Would wait 72 hrs before deciding to place picc line In the meantime, continue with cefazolin 2gm IV Q 8hr  2) covid-19 infection, asymptomatic = can repeat test tomorrow morning (cepheid 2hr test), if negative, then can remove off of airborne/contact isolation. If still positive, then he will be on isolation through Monday, and since he is asymptomatic - he has finished his 10d of quarantine.  Dr Luciana Axe to see on Monday, if questions, dr Orvan Falconer available for questions over the weekend.  Duke Salvia Drue Second MD MPH [facility] [phone]

## 2020-04-21 NOTE — Anesthesia Preprocedure Evaluation (Signed)
Anesthesia Evaluation  Patient identified by MRN, date of birth, ID band Patient awake    Reviewed: Allergy & Precautions, NPO status , Patient's Chart, lab work & pertinent test results  Airway Mallampati: II  TM Distance: >3 FB Neck ROM: Full    Dental  (+) Dental Advisory Given   Pulmonary COPD (O2 prn),  COPD inhaler and oxygen dependent, Current Smoker and Patient abstained from smoking.,    breath sounds clear to auscultation       Cardiovascular hypertension, Pt. on medications +CHF and + DVT   Rhythm:Regular Rate:Normal  ECG: NSR, rate 87  ECHO: 1. Significantly limited study with very poor endocardial visualization. 2. Left ventricular ejection fraction, by estimation, is 20%. Left ventricular ejection fraction by PLAX is 20 %. The left ventricle has severely decreased function. Left ventricular endocardial border not optimally defined to evaluate regional wall motion. There is mild left ventricular hypertrophy. Left ventricular diastolic parameters are indeterminate. 3. Right ventricular systolic function was not well visualized. The right ventricular size is not well visualized. 4. The aortic valve was not well visualized. Aortic valve regurgitation is not visualized. 5. The mitral valve was not well visualized. No evidence of mitral valve regurgitation. 6. LV inflow cannula not visualized. Comparison(s): 08/10/18: LVEF 15-20%.  LVAD (left ventricular assist device) present   S/P HeartMate 3 placement 2019 MSSA infection of LVAD power cord tunnel with recurrent drainage associated with bleeding in upper abdominal incision   Neuro/Psych negative neurological ROS  negative psych ROS   GI/Hepatic Neg liver ROS, PUD,   Endo/Other  diabetes  Renal/GU negative Renal ROS     Musculoskeletal Chronic back pain   Abdominal   Peds  Hematology  (+) anemia ,   Anesthesia Other Findings BLEEDING FROM WOUND   Reproductive/Obstetrics                             Anesthesia Physical  Anesthesia Plan  ASA: IV  Anesthesia Plan: General   Post-op Pain Management:    Induction: Intravenous  PONV Risk Score and Plan: 1 and Ondansetron, Dexamethasone and Treatment may vary due to age or medical condition  Airway Management Planned: Oral ETT  Additional Equipment:   Intra-op Plan:   Post-operative Plan: Extubation in OR  Informed Consent: I have reviewed the patients History and Physical, chart, labs and discussed the procedure including the risks, benefits and alternatives for the proposed anesthesia with the patient or authorized representative who has indicated his/her understanding and acceptance.     Dental advisory given  Plan Discussed with: Surgeon and CRNA  Anesthesia Plan Comments:         Anesthesia Quick Evaluation

## 2020-04-21 NOTE — Progress Notes (Signed)
Day of Surgery Procedure(s) (LRB): WOUND VAC CHANGE (N/A) Subjective: The patient was taken back to the OR for wound VAC change of the upper abdominal wound involving the VAD power cord tunnel.  It appeared to be clean without purulence.  It was irrigated and covered with a wound VAC sponge.  The potential plan would be for muscle flap coverage of this defect with reconstructive plastic surgery next week.  The patient will remain on IV heparin until then.  He is now stable and can be moved to the progressive care unit to [facility].  Continue IV antibiotics-Ancef for the MSSA infection. He is still under isolation for testing positive for Covid but without clinical stigmata of the viral process.  Objective: Vital signs in last 24 hours: Temp:  [98.4 F (36.9 C)-99.7 F (37.6 C)] 99.5 F (37.5 C) (06/04 1400) Pulse Rate:  [54-79] 67 (06/04 1726) Cardiac Rhythm: Normal sinus rhythm (06/04 0800) Resp:  [17-25] 19 (06/04 1726) BP: (91-135)/(74-106) 108/85 (06/04 1726) SpO2:  [96 %-100 %] 96 % (06/04 1726) Weight:  [94.2 kg] 94.2 kg (06/04 0500)  Hemodynamic parameters for last 24 hours:    Intake/Output from previous day: 06/03 0701 - 06/04 0700 In: 587.6 [I.V.:431; IV Piggyback:156.6] Out: 5375 [Urine:5325; Drains:50] Intake/Output this shift: Total I/O In: 1183.8 [I.V.:1107.1; IV Piggyback:76.7] Out: 355 [Urine:350; Blood:5]  Exam Wound VAC sponge compressed no bloody drainage Lungs clear Neuro intact Normal VAD hum  Lab Results: Recent Labs    04/20/20 0500 04/21/20 0525  WBC 10.8* 7.6  HGB 9.7* 9.1*  HCT 30.0* 28.5*  PLT 151 173   BMET:  Recent Labs    04/20/20 0500 04/21/20 0525  NA 140 143  K 3.9 3.8  CL 106 105  CO2 28 28  GLUCOSE 109* 121*  BUN 10 8  CREATININE 0.76 0.69  CALCIUM 8.3* 8.6*    PT/INR:  Recent Labs    04/21/20 0525  LABPROT 13.7  INR 1.1   ABG    Component Value Date/Time   PHART 7.464 (H) 04/19/2020 0832   HCO3 26.2 04/19/2020  0832   TCO2 27 04/19/2020 0832   ACIDBASEDEF 1.0 04/18/2020 1513   O2SAT 99.0 04/19/2020 0832   CBG (last 3)  Recent Labs    04/20/20 1607 04/20/20 2144 04/21/20 1209  GLUCAP 133* 112* 104*    Assessment/Plan: S/P Procedure(s) (LRB): WOUND VAC CHANGE (N/A) Mobilize Diuresis Muscle flap coverage of abdominal wound next week.  Continue to hold Coumadin   LOS: 5 days    Theodore Welch 04/21/2020

## 2020-04-22 ENCOUNTER — Inpatient Hospital Stay: Payer: Self-pay

## 2020-04-22 LAB — BASIC METABOLIC PANEL
Anion gap: 8 (ref 5–15)
BUN: 9 mg/dL (ref 8–23)
CO2: 27 mmol/L (ref 22–32)
Calcium: 9.3 mg/dL (ref 8.9–10.3)
Chloride: 105 mmol/L (ref 98–111)
Creatinine, Ser: 0.76 mg/dL (ref 0.61–1.24)
GFR calc Af Amer: >60 mL/min (ref >60)
GFR calc non Af Amer: >60 mL/min (ref >60)
Glucose, Bld: 146 mg/dL — ABNORMAL HIGH (ref 70–99)
Potassium: 3.9 mmol/L (ref 3.5–5.1)
Sodium: 140 mmol/L (ref 135–145)

## 2020-04-22 LAB — CBC
HCT: 31.4 % — ABNORMAL LOW (ref 39.0–52.0)
Hemoglobin: 10.4 g/dL — ABNORMAL LOW (ref 13.0–17.0)
MCH: 30 pg (ref 26.0–34.0)
MCHC: 33.1 g/dL (ref 30.0–36.0)
MCV: 90.5 fL (ref 80.0–100.0)
Platelets: 221 10*3/uL (ref 150–400)
RBC: 3.47 MIL/uL — ABNORMAL LOW (ref 4.22–5.81)
RDW: 13.7 % (ref 11.5–15.5)
WBC: 14.3 10*3/uL — ABNORMAL HIGH (ref 4.0–10.5)
nRBC: 0 % (ref 0.0–0.2)

## 2020-04-22 LAB — PROTIME-INR
INR: 1 (ref 0.8–1.2)
Prothrombin Time: 13.2 seconds (ref 11.4–15.2)

## 2020-04-22 LAB — HEPARIN LEVEL (UNFRACTIONATED): Heparin Unfractionated: <0.1 IU/mL — ABNORMAL LOW (ref 0.30–0.70)

## 2020-04-22 LAB — GLUCOSE, CAPILLARY
Glucose-Capillary: 188 mg/dL — ABNORMAL HIGH (ref 70–99)
Glucose-Capillary: 159 mg/dL — ABNORMAL HIGH (ref 70–99)
Glucose-Capillary: 88 mg/dL (ref 70–99)
Glucose-Capillary: 109 mg/dL — ABNORMAL HIGH (ref 70–99)

## 2020-04-22 LAB — SARS CORONAVIRUS 2 BY RT PCR (HOSPITAL ORDER, PERFORMED IN ~~LOC~~ HOSPITAL LAB): SARS Coronavirus 2: NEGATIVE

## 2020-04-22 LAB — LACTATE DEHYDROGENASE: LDH: 231 U/L — ABNORMAL HIGH (ref 98–192)

## 2020-04-22 MED ORDER — BISACODYL 5 MG PO TBEC
5.0000 mg | DELAYED_RELEASE_TABLET | Freq: Every day | ORAL | Status: DC | PRN
  Filled 2020-04-22: qty 1

## 2020-04-22 MED ORDER — AMLODIPINE BESYLATE 5 MG PO TABS
5.0000 mg | ORAL_TABLET | Freq: Every day | ORAL | Status: DC
  Administered 2020-04-22 – 2020-04-25 (×4): 5 mg via ORAL
  Filled 2020-04-22 (×5): qty 1

## 2020-04-22 NOTE — Progress Notes (Signed)
1 Day Post-Op Procedure(s) (LRB): WOUND VAC CHANGE (N/A) Subjective: Ambulating in hallway now off Covid restriction isolatio Wound VAC with sponge compressed minimal serosanguineous drainage Heparin drip increased to 800 units/h Culture of the power cord exit site from OR remains negative to date Plan return to the OR for muscle flap on Thursday a.m. with Dr. Ulice Bold Continue IV Ancef, good nutrition and maintain hemoglobin until surgery We will place PICC line and remove IJ central line  Objective: Vital signs in last 24 hours: Temp:  [98 F (36.7 C)-98.7 F (37.1 C)] 98.3 F (36.8 C) (06/05 0800) Pulse Rate:  [63-92] 74 (06/05 0006) Cardiac Rhythm: Normal sinus rhythm (06/05 0800) Resp:  [15-21] 18 (06/05 0500) BP: (98-124)/(76-101) 113/84 (06/05 0800) SpO2:  [94 %-100 %] 98 % (06/05 0800) Weight:  [91.4 kg] 91.4 kg (06/05 0241)  Hemodynamic parameters for last 24 hours:  Table  Intake/Output from previous day: 06/04 0701 - 06/05 0700 In: 1880.7 [I.V.:1604; IV Piggyback:276.7] Out: 1610 [RUEAV:4098; Blood:5] Intake/Output this shift: Total I/O In: 956.6 [P.O.:580; I.V.:376.6] Out: -         Exam    General- alert and comfortable    Neck- no JVD, no cervical adenopathy palpable, no carotid bruit   Lungs- clear without rales, wheezes   Cor- regular rate and rhythm, no murmur , normal VAD hum   Abdomen- soft, non-tender   Extremities - warm, non-tender, minimal edema   Neuro- oriented, appropriate, no focal weakness   Lab Results: Recent Labs    04/21/20 0525 04/22/20 1303  WBC 7.6 14.3*  HGB 9.1* 10.4*  HCT 28.5* 31.4*  PLT 173 221   BMET:  Recent Labs    04/21/20 0525 04/22/20 1303  NA 143 140  K 3.8 3.9  CL 105 105  CO2 28 27  GLUCOSE 121* 146*  BUN 8 9  CREATININE 0.69 0.76  CALCIUM 8.6* 9.3    PT/INR:  Recent Labs    04/22/20 0250  LABPROT 13.2  INR 1.0   ABG    Component Value Date/Time   PHART 7.464 (H) 04/19/2020 0832    HCO3 26.2 04/19/2020 0832   TCO2 27 04/19/2020 0832   ACIDBASEDEF 1.0 04/18/2020 1513   O2SAT 99.0 04/19/2020 0832   CBG (last 3)  Recent Labs    04/21/20 1829 04/21/20 2107 04/22/20 0552  GLUCAP 170* 188* 159*    Assessment/Plan: S/P Procedure(s) (LRB): WOUND VAC CHANGE (N/A) Plan muscle flap coverage of open lower chest wound Thursday Continue IV heparin until OR   LOS: 6 days    [name] 04/22/2020

## 2020-04-22 NOTE — Progress Notes (Addendum)
Patient ID: Theodore Welch, male   DOB: 1961/04/06, 59 y.o.   MRN: [id number]   Advanced Heart Failure VAD Team Note  PCP-Cardiologist: No primary care provider on file.   Subjective:    Here w/ subxiphoid abscess and cellulitis + bleeding from wound site. Admit Hgb 7.4. COVID +.   Speed decreased 5700 to 5500 at admission.   S/p multiple transfusions. Has received total of 3 units PRBCs and 5 units FFP. Hgb stabilized but pending today.   Blood cultures 1/2 positive for MSSA.  On cefazolin.  Afebrile. ID following.  MAP 90s, feels good.   Went to OR 6/1 for I&D of chest wound + repair of VAD outflow graft. Required fem-fem cardiopulmonary bypass. Wound vac placed. Back to OR 6/4 for debridement and wound vac change.   Repeat COVID-19 test negative today.   LVAD INTERROGATION:  HeartMate III LVAD:   Flow 3.9  liters/min, speed 5500, power 4.1 PI 6.3      Objective:    Vital Signs:   Temp:  [98 F (36.7 C)-99.5 F (37.5 C)] 98.3 F (36.8 C) (06/05 0800) Pulse Rate:  [59-92] 74 (06/05 0006) Resp:  [15-22] 18 (06/05 0500) BP: (98-124)/(76-101) 113/84 (06/05 0800) SpO2:  [94 %-100 %] 98 % (06/05 0800) Weight:  [91.4 kg] 91.4 kg (06/05 0241) Last BM Date: 04/21/20 Mean arterial Pressure 90s Intake/Output:   Intake/Output Summary (Last 24 hours) at 04/22/2020 1133 Last data filed at 04/22/2020 0900 Gross per 24 hour  Intake 2200.01 ml  Output 1630 ml  Net 570.01 ml     Physical Exam    General: Well appearing this am. NAD.  HEENT: Normal. Neck: Supple, JVP 7-8 cm. Carotids OK.  Cardiac:  Mechanical heart sounds with LVAD hum present.  Lungs:  CTAB, normal effort.  Abdomen:  NT, ND, no HSM. No bruits or masses. +BS  LVAD exit site: Well-healed and incorporated. Dressing dry and intact. No erythema or drainage. Stabilization device present and accurately applied. Driveline dressing changed daily per sterile technique. Extremities:  Warm and dry. No cyanosis, clubbing,  rash, or edema.  Neuro:  Alert & oriented x 3. Cranial nerves grossly intact. Moves all 4 extremities w/o difficulty. Affect pleasant     Telemetry  NSR 70-80s   EKG   n/a   Labs   Basic Metabolic Panel: Recent Labs  Lab 04/18/20 0401 04/18/20 0401 04/18/20 1155 04/18/20 1517 04/18/20 1709 04/18/20 1709 04/19/20 0341 04/19/20 0350 04/19/20 0832 04/20/20 0500 04/21/20 0525  NA 141   < >   < > 142 143   < > 142 143 144 140 143  K 4.1   < >   < > 3.8 3.9   < > 4.6 4.5 4.4 3.9 3.8  CL 102  --    < > 102 109  --  109  --   --  106 105  CO2 24  --   --   --  25  --  25  --   --  28 28  GLUCOSE 103*  --    < > 154* 80  --  168*  --   --  109* 121*  BUN 11  --    < > 11 11  --  13  --   --  10 8  CREATININE 0.86  --    < > 0.60* 0.79  --  1.03  --   --  0.76 0.69  CALCIUM 9.4   < >  --   --  7.6*   < > 8.2*  --   --  8.3* 8.6*   < > = values in this interval not displayed.    Liver Function Tests: Recent Labs  Lab 04/16/20 0408 04/18/20 1709  AST 19 24  ALT 14 13  ALKPHOS 105 54  BILITOT 0.6 1.6*  PROT 7.1 4.6*  ALBUMIN 3.4* 2.3*   No results for input(s): LIPASE, AMYLASE in the last 168 hours. No results for input(s): AMMONIA in the last 168 hours.  CBC: Recent Labs  Lab 04/16/20 0408 04/17/20 0059 04/18/20 0401 04/18/20 1155 04/18/20 1709 04/18/20 1709 04/19/20 0341 04/19/20 0350 04/19/20 0832 04/20/20 0500 04/21/20 0525  WBC 16.6*   < > 8.3  --  18.5*  --  18.0*  --   --  10.8* 7.6  NEUTROABS 10.9*  --   --   --   --   --   --   --   --   --   --   HGB 10.5*   < > 8.2*   < > 13.5   < > 11.9* 11.2* 10.5* 9.7* 9.1*  HCT 33.6*   < > 24.9*   < > 39.3   < > 35.4* 33.0* 31.0* 30.0* 28.5*  MCV 92.3   < > 87.7  --  87.1  --  88.7  --   --  91.7 92.8  PLT 424*   < > 234  --  118*  --  168  --   --  151 173   < > = values in this interval not displayed.    INR: Recent Labs  Lab 04/18/20 1709 04/19/20 0341 04/20/20 0500 04/21/20 0525 04/22/20 0250    INR 1.4* 1.3* 1.2 1.1 1.0    Other results:  EKG:    Imaging   No results found.   Medications:     Scheduled Medications: . amLODipine  5 mg Oral Daily  . gabapentin  600 mg Oral QID  . insulin aspart  0-15 Units Subcutaneous TID WC  . insulin aspart  0-5 Units Subcutaneous QHS  . mupirocin cream   Topical BID  . pantoprazole  40 mg Oral Daily  . sacubitril-valsartan  1 tablet Oral BID  . sodium hypochlorite   Irrigation To OR  . spironolactone  25 mg Oral Daily  . thiamine  100 mg Oral Daily  . varenicline  1 mg Oral Daily  . vitamin B-12  1,000 mcg Oral Daily  . zinc sulfate  220 mg Oral BID    Infusions: .  ceFAZolin (ANCEF) IV 2 g (04/22/20 0506)  . dextrose 5 % and 0.45% NaCl 50 mL/hr at 04/22/20 0900  . heparin 700 Units/hr (04/22/20 0900)  . norepinephrine (LEVOPHED) Adult infusion Stopped (04/19/20 1110)    PRN Medications: acetaminophen, bisacodyl, fentaNYL (SUBLIMAZE) injection, fluticasone, hydrALAZINE, Ipratropium-Albuterol, ondansetron (ZOFRAN) IV, oxyCODONE, sodium chloride flush, zolpidem   Assessment/Plan:    1. 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 He previously required debridement of lower sternalwound with woundVAC therapy and IV antibiotics.This was completed with 3 surgical procedures including debridement and ACell application in March 2021.  More recently, the lower sternal wound reopened with drainage again culturing MSSA and he had been on cephalexin + doxycycline.  The day of admission, he developed persistent bleeding from the wound site and came to ER, bleeding now stopped with suture.  Bleeding again 5/30, now stopped again. Got an additional 1 units  PRBCs on 5/31 and again 6/1. Overall he has received 3 units PRBCs + 5 units FFP.  ID consulted and switched antibiotics to cefazolin.  1/2 blood cultures + for MSSA. CT chest/abd/pelvis--> Stable small amount of soft tissue  tracking along the proximal drive line in the subcutaneous ventral left abdominal wall.  S/p I&D + wound vac placement on 6/1.   Back to OR for debridement and wound vac change on 6/4.  Afebrile.  - Continue cefazolin IV   - Will need to be seen by plastic surgery for possible muscle flap closure next week  - Coumadin on hold for procedures. Continue IV heparin (has been restarted s/p OR yesterday).  2. COVID-19 infection: Asymptomatic. No cough/dyspnea.  Repeat COVID-19 test negative today. - He can come off isolation.    3. 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 LVAD parameters stable.  Has been NYHA class II.  MAPs low initially, started on phenylephrine (may have been at least partly vagal as very diaphoretic with bleeding).  Now off phenylephrine and NE.  - Continue spironolactone 25 mg daily.  - Continue Entresto 49/51 bid.  - Add back amlodipine 5 mg daily.  - Hold ASA and warfarin with bleeding. He is on heparin gtt.  - Increase speed back to 5700 rpm prior to discharge.  4. Smoking:He has cut back a lot, still taking Chantix. Encouraged to quit. 5. H/o RLE DVT: On warfarin for LVAD but held for wound exploration 6. OSA: Continue CPAP.  7. Hyperlipidemia: Atorvastatin.  8. Type II diabetes:SSI 9. Anemia, Acute blood loss: Hgb has stabilized.   Ambulate in halls.     I reviewed the LVAD parameters from today, and compared the results to the patient's prior recorded data.  No programming changes were made.  The LVAD is functioning within specified parameters.  The patient performs LVAD self-test daily.  LVAD interrogation was negative for any significant power changes, alarms or PI events/speed drops.  LVAD equipment check completed and is in good working order.  Back-up equipment present.   LVAD education done on emergency procedures and precautions and reviewed exit site care.  Length of Stay: 6  Marca Ancona 04/22/2020 11:33  AM  VAD Team --- VAD ISSUES ONLY--- Pager [phone] (7am - 7am)  Advanced Heart Failure Team  Pager [phone] (M-F; 7a - 4p)  Please contact [facility] for night-coverage after hours (4p -7a ) and weekends on [email or web address]

## 2020-04-22 NOTE — Progress Notes (Addendum)
ANTICOAGULATION CONSULT NOTE   Pharmacy Consult for heparin - per TCTS dosing Indication: LVAD  Allergies  Allergen Reactions  . Chlorhexidine Rash  . Other Rash    Prep pads    Patient Measurements: Height: 5\' 11"  (180.3 cm) Weight: 91.4 kg (201 lb 9.6 oz) IBW/kg (Calculated) : 75.3 Heparin Dosing Weight: ~ 90 kg  Vital Signs: Temp: 98.3 F (36.8 C) (06/05 0800) Temp Source: Oral (06/05 0800) BP: 113/84 (06/05 0800)  Labs: Recent Labs    04/20/20 0500 04/20/20 0500 04/21/20 0525 04/22/20 0250 04/22/20 1303  HGB 9.7*   < > 9.1*  --  10.4*  HCT 30.0*  --  28.5*  --  31.4*  PLT 151  --  173  --  221  LABPROT 14.5  --  13.7 13.2  --   INR 1.2  --  1.1 1.0  --   HEPARINUNFRC <0.10*  --  <0.10* <0.10*  --   CREATININE 0.76  --  0.69  --  0.76   < > = values in this interval not displayed.    Estimated Creatinine Clearance: 112.1 mL/min (by C-G formula based on SCr of 0.76 mg/dL).   Medical History: Past Medical History:  Diagnosis Date  . 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 Cardiomyopathy, unspecified (HCC)   . CHF (congestive heart failure) (HCC)   . Chronic back pain   . Diabetes mellitus without complication (HCC)   . Enlarged heart   . Gastric ulcer   . Gastroenteritis   . H/O degenerative disc disease   . Hypertension   . LVAD (left ventricular assist device) present (HCC)     Medications:   Infusions:  .  ceFAZolin (ANCEF) IV 2 g (04/22/20 0506)  . dextrose 5 % and 0.45% NaCl 50 mL/hr at 04/22/20 1200  . heparin 700 Units/hr (04/22/20 1200)  . norepinephrine (LEVOPHED) Adult infusion Stopped (04/19/20 1110)    Assessment: 59 yo male with LVAD, MSSA driveline infection.  Chronic Coumadin on hold for procedures.  Heparin started 6/3 at low dose.  Currently heparin level remains undetectable on 700 units/hr.  No overt bleeding or complications noted.  Goal of Therapy:  Monitor platelets by anticoagulation protocol: Yes   Plan:  Increase IV  Heparin to 800 units/hr. No plans to titrate. Daily heparin level and CBC.  77, Reece Leader, BCCP Clinical Pharmacist  04/22/2020 2:59 PM   [facility] pharmacy phone numbers are listed on [email or web address]

## 2020-04-22 NOTE — Anesthesia Postprocedure Evaluation (Signed)
Anesthesia Post Note  Patient: Theodore Welch  Procedure(s) Performed: WOUND VAC CHANGE (N/A )     Patient location during evaluation: PACU Anesthesia Type: General Level of consciousness: awake and alert Pain management: pain level controlled Vital Signs Assessment: post-procedure vital signs reviewed and stable Respiratory status: spontaneous breathing, nonlabored ventilation, respiratory function stable and patient connected to nasal cannula oxygen Cardiovascular status: blood pressure returned to baseline and stable Postop Assessment: no apparent nausea or vomiting Anesthetic complications: no    Last Vitals:  Vitals:   04/22/20 0500 04/22/20 0800  BP: 117/82 113/84  Pulse:    Resp: 18   Temp: 36.9 C 36.8 C  SpO2: 98% 98%    Last Pain:  Vitals:   04/22/20 0900  TempSrc:   PainSc: 5                  Kennieth Rad

## 2020-04-22 NOTE — Progress Notes (Signed)
PICC order received to remove CVC.  Per Dr Drue Second ID note on 04-21-20, PICC not to be placed until [facility] negative x 72 hours (04-23-20 @2325 )  RN and  Dr Cherly Hensen notified.  PICC order cancelled to be reordered Monday if BC negative.

## 2020-04-23 LAB — BASIC METABOLIC PANEL
Anion gap: 11 (ref 5–15)
BUN: 11 mg/dL (ref 8–23)
CO2: 25 mmol/L (ref 22–32)
Calcium: 8.8 mg/dL — ABNORMAL LOW (ref 8.9–10.3)
Chloride: 107 mmol/L (ref 98–111)
Creatinine, Ser: 1 mg/dL (ref 0.61–1.24)
GFR calc Af Amer: >60 mL/min (ref >60)
GFR calc non Af Amer: >60 mL/min (ref >60)
Glucose, Bld: 127 mg/dL — ABNORMAL HIGH (ref 70–99)
Potassium: 3.9 mmol/L (ref 3.5–5.1)
Sodium: 143 mmol/L (ref 135–145)

## 2020-04-23 LAB — CBC
HCT: 31.5 % — ABNORMAL LOW (ref 39.0–52.0)
Hemoglobin: 10.3 g/dL — ABNORMAL LOW (ref 13.0–17.0)
MCH: 30.1 pg (ref 26.0–34.0)
MCHC: 32.7 g/dL (ref 30.0–36.0)
MCV: 92.1 fL (ref 80.0–100.0)
Platelets: 238 10*3/uL (ref 150–400)
RBC: 3.42 MIL/uL — ABNORMAL LOW (ref 4.22–5.81)
RDW: 13.9 % (ref 11.5–15.5)
WBC: 9.8 10*3/uL (ref 4.0–10.5)
nRBC: 0 % (ref 0.0–0.2)

## 2020-04-23 LAB — LACTATE DEHYDROGENASE: LDH: 204 U/L — ABNORMAL HIGH (ref 98–192)

## 2020-04-23 LAB — AEROBIC/ANAEROBIC CULTURE W GRAM STAIN (SURGICAL/DEEP WOUND)

## 2020-04-23 LAB — PROTIME-INR
INR: 1 (ref 0.8–1.2)
Prothrombin Time: 12.5 seconds (ref 11.4–15.2)

## 2020-04-23 LAB — HEPARIN LEVEL (UNFRACTIONATED)
Heparin Unfractionated: <0.1 IU/mL — ABNORMAL LOW (ref 0.30–0.70)
Heparin Unfractionated: <0.1 IU/mL — ABNORMAL LOW (ref 0.30–0.70)

## 2020-04-23 LAB — GLUCOSE, CAPILLARY
Glucose-Capillary: 110 mg/dL — ABNORMAL HIGH (ref 70–99)
Glucose-Capillary: 204 mg/dL — ABNORMAL HIGH (ref 70–99)
Glucose-Capillary: 121 mg/dL — ABNORMAL HIGH (ref 70–99)
Glucose-Capillary: 157 mg/dL — ABNORMAL HIGH (ref 70–99)

## 2020-04-23 MED ORDER — SACUBITRIL-VALSARTAN 97-103 MG PO TABS
1.0000 | ORAL_TABLET | Freq: Two times a day (BID) | ORAL | Status: DC
  Administered 2020-04-23 – 2020-04-26 (×8): 1 via ORAL
  Filled 2020-04-23 (×10): qty 1

## 2020-04-23 MED ORDER — ASPIRIN 81 MG PO CHEW
81.0000 mg | CHEWABLE_TABLET | Freq: Every day | ORAL | Status: DC
  Administered 2020-04-23 – 2020-05-06 (×13): 81 mg via ORAL
  Filled 2020-04-23 (×13): qty 1

## 2020-04-23 NOTE — Plan of Care (Deleted)
  Problem: Education: Goal: Knowledge of General Education information will improve Description: Including pain rating scale, medication(s)/side effects and non-pharmacologic comfort measures 04/23/2020 0033 by Reynold Bowen, RN Outcome: Progressing 04/23/2020 0032 by Reynold Bowen, RN Outcome: Progressing   Problem: Health Behavior/Discharge Planning: Goal: Ability to manage health-related needs will improve 04/23/2020 0033 by Reynold Bowen, RN Outcome: Progressing 04/23/2020 0032 by Reynold Bowen, RN Outcome: Progressing   Problem: Clinical Measurements: Goal: Ability to maintain clinical measurements within normal limits will improve 04/23/2020 0033 by Reynold Bowen, RN Outcome: Progressing 04/23/2020 0032 by Reynold Bowen, RN Outcome: Progressing Goal: Will remain free from infection 04/23/2020 0033 by Reynold Bowen, RN Outcome: Progressing 04/23/2020 0032 by Reynold Bowen, RN Outcome: Progressing Goal: Diagnostic test results will improve 04/23/2020 0033 by Reynold Bowen, RN Outcome: Progressing 04/23/2020 0032 by Reynold Bowen, RN Outcome: Progressing Goal: Respiratory complications will improve 04/23/2020 0033 by Reynold Bowen, RN Outcome: Progressing 04/23/2020 0032 by Reynold Bowen, RN Outcome: Progressing Goal: Cardiovascular complication will be avoided 04/23/2020 0033 by Reynold Bowen, RN Outcome: Progressing 04/23/2020 0032 by Reynold Bowen, RN Outcome: Progressing   Problem: Activity: Goal: Risk for activity intolerance will decrease 04/23/2020 0033 by Reynold Bowen, RN Outcome: Progressing 04/23/2020 0032 by Reynold Bowen, RN Outcome: Progressing   Problem: Nutrition: Goal: Adequate nutrition will be maintained 04/23/2020 0033 by Reynold Bowen, RN Outcome: Progressing 04/23/2020 0032 by Reynold Bowen, RN Outcome: Progressing   Problem: Coping: Goal: Level of anxiety will decrease 04/23/2020 0033 by Reynold Bowen, RN Outcome:  Progressing 04/23/2020 0032 by Reynold Bowen, RN Outcome: Progressing   Problem: Elimination: Goal: Will not experience complications related to urinary retention 04/23/2020 0033 by Reynold Bowen, RN Outcome: Progressing 04/23/2020 0032 by Reynold Bowen, RN Outcome: Progressing   Problem: Pain Managment: Goal: General experience of comfort will improve 04/23/2020 0033 by Reynold Bowen, RN Outcome: Progressing 04/23/2020 0032 by Reynold Bowen, RN Outcome: Progressing   Problem: Safety: Goal: Ability to remain free from injury will improve 04/23/2020 0033 by Reynold Bowen, RN Outcome: Progressing 04/23/2020 0032 by Reynold Bowen, RN Outcome: Progressing   Problem: Skin Integrity: Goal: Risk for impaired skin integrity will decrease 04/23/2020 0033 by Reynold Bowen, RN Outcome: Progressing 04/23/2020 0032 by Reynold Bowen, RN Outcome: Progressing

## 2020-04-23 NOTE — Progress Notes (Signed)
ANTICOAGULATION CONSULT NOTE   Pharmacy Consult for heparin - per TCTS dosing Indication: LVAD  Allergies  Allergen Reactions  . Chlorhexidine Rash  . Other Rash    Prep pads    Patient Measurements: Height: 5\' 11"  (180.3 cm) Weight: 91.5 kg (201 lb 11.5 oz) IBW/kg (Calculated) : 75.3 Heparin Dosing Weight: ~ 90 kg  Vital Signs: Temp: 98.5 F (36.9 C) (06/06 1907) Temp Source: Oral (06/06 1907) BP: 110/91 (06/06 1907) Pulse Rate: 77 (06/06 1907)  Labs: Recent Labs    04/21/20 0525 04/21/20 0525 04/22/20 0250 04/22/20 1303 04/23/20 0348  HGB 9.1*   < >  --  10.4* 10.3*  HCT 28.5*  --   --  31.4* 31.5*  PLT 173  --   --  221 238  LABPROT 13.7  --  13.2  --  12.5  INR 1.1  --  1.0  --  1.0  HEPARINUNFRC <0.10*  --  <0.10*  --  <0.10*  CREATININE 0.69  --   --  0.76 1.00   < > = values in this interval not displayed.    Estimated Creatinine Clearance: 89.8 mL/min (by C-G formula based on SCr of 1 mg/dL).   Medical History: Past Medical History:  Diagnosis Date  . 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 Cardiomyopathy, unspecified (HCC)   . CHF (congestive heart failure) (HCC)   . Chronic back pain   . Diabetes mellitus without complication (HCC)   . Enlarged heart   . Gastric ulcer   . Gastroenteritis   . H/O degenerative disc disease   . Hypertension   . LVAD (left ventricular assist device) present (HCC)     Medications:   Infusions:  .  ceFAZolin (ANCEF) IV 2 g (04/23/20 1344)  . dextrose 5 % and 0.45% NaCl 50 mL/hr at 04/23/20 1959  . heparin 900 Units/hr (04/23/20 1600)  . norepinephrine (LEVOPHED) Adult infusion Stopped (04/19/20 1110)    Assessment: 59 yo male with LVAD, MSSA driveline infection.  Chronic Coumadin on hold for procedures.  Heparin started 6/3 at low dose.  Currently heparin level remains undetectable on 900 units/hr.    Goal of Therapy:  Monitor platelets by anticoagulation protocol: Yes   Plan:  Continue heparin at 900  units/hr No plans to titrate. Daily heparin level and CBC.  77, PharmD Clinical Pharmacist **Pharmacist phone directory can now be found on [email or web address] ([email or web address]).  Listed under [facility].

## 2020-04-23 NOTE — Plan of Care (Signed)

## 2020-04-23 NOTE — Progress Notes (Signed)
Patient ID: Theodore Welch, male   DOB: Jul 17, 1961, 59 y.o.   MRN: [id number]   Advanced Heart Failure VAD Team Note  PCP-Cardiologist: No primary care provider on file.   Subjective:    Here w/ subxiphoid abscess and cellulitis + bleeding from wound site. Admit Hgb 7.4. COVID +.   Speed decreased 5700 to 5500 at admission.   S/p multiple transfusions. Has received total of 3 units PRBCs and 5 units FFP. Hgb stabilized but pending today.   Blood cultures 1/2 positive for MSSA.  On cefazolin.  Afebrile. ID following.  MAP 90s, feels good.   Went to OR 6/1 for I&D of chest wound + repair of VAD outflow graft. Required fem-fem cardiopulmonary bypass. Wound vac placed. Back to OR 6/4 for debridement and wound vac change.   Repeat COVID-19 test negative 6/5.   LVAD INTERROGATION:  HeartMate III LVAD:   Flow 4  liters/min, speed 5700, power 4 PI 5.8      Objective:    Vital Signs:   Temp:  [98.4 F (36.9 C)-98.5 F (36.9 C)] 98.4 F (36.9 C) (06/06 0722) Pulse Rate:  [84] 84 (06/06 0722) Resp:  [17-20] 17 (06/06 0722) BP: (82-122)/(72-90) 122/90 (06/06 0722) SpO2:  [98 %-100 %] 100 % (06/06 0722) Weight:  [91.5 kg] 91.5 kg (06/06 0340) Last BM Date: 04/21/20 Mean arterial Pressure 90s Intake/Output:   Intake/Output Summary (Last 24 hours) at 04/23/2020 0948 Last data filed at 04/23/2020 0400 Gross per 24 hour  Intake 1529.89 ml  Output 600 ml  Net 929.89 ml     Physical Exam    General: Well appearing this am. NAD.  HEENT: Normal. Neck: Supple, JVP 7-8 cm. Carotids OK.  Cardiac:  Mechanical heart sounds with LVAD hum present.  Lungs:  CTAB, normal effort.  Abdomen:  NT, ND, no HSM. No bruits or masses. +BS  LVAD exit site: Well-healed and incorporated. Dressing dry and intact. No erythema or drainage. Stabilization device present and accurately applied. Driveline dressing changed daily per sterile technique. Extremities:  Warm and dry. No cyanosis, clubbing, rash, or  edema.  Neuro:  Alert & oriented x 3. Cranial nerves grossly intact. Moves all 4 extremities w/o difficulty. Affect pleasant     Telemetry  NSR 70-80s   EKG   n/a   Labs   Basic Metabolic Panel: Recent Labs  Lab 04/19/20 0341 04/19/20 0341 04/19/20 0350 04/19/20 0832 04/20/20 0500 04/20/20 0500 04/21/20 0525 04/22/20 1303 04/23/20 0348  NA 142  --    < > 144 140  --  143 140 143  K 4.6  --    < > 4.4 3.9  --  3.8 3.9 3.9  CL 109  --   --   --  106  --  105 105 107  CO2 25  --   --   --  28  --  28 27 25   GLUCOSE 168*  --   --   --  109*  --  121* 146* 127*  BUN 13  --   --   --  10  --  8 9 11   CREATININE 1.03  --   --   --  0.76  --  0.69 0.76 1.00  CALCIUM 8.2*   < >  --   --  8.3*   < > 8.6* 9.3 8.8*   < > = values in this interval not displayed.    Liver Function Tests: Recent Labs  Lab 04/18/20  1709  AST 24  ALT 13  ALKPHOS 54  BILITOT 1.6*  PROT 4.6*  ALBUMIN 2.3*   No results for input(s): LIPASE, AMYLASE in the last 168 hours. No results for input(s): AMMONIA in the last 168 hours.  CBC: Recent Labs  Lab 04/19/20 0341 04/19/20 0350 04/19/20 0832 04/20/20 0500 04/21/20 0525 04/22/20 1303 04/23/20 0348  WBC 18.0*  --   --  10.8* 7.6 14.3* 9.8  HGB 11.9*   < > 10.5* 9.7* 9.1* 10.4* 10.3*  HCT 35.4*   < > 31.0* 30.0* 28.5* 31.4* 31.5*  MCV 88.7  --   --  91.7 92.8 90.5 92.1  PLT 168  --   --  151 173 221 238   < > = values in this interval not displayed.    INR: Recent Labs  Lab 04/19/20 0341 04/20/20 0500 04/21/20 0525 04/22/20 0250 04/23/20 0348  INR 1.3* 1.2 1.1 1.0 1.0    Other results:  EKG:    Imaging   [name] EKG SITE RITE  Result Date: 04/22/2020 If Site Rite image not attached, placement could not be confirmed due to current cardiac rhythm.    Medications:     Scheduled Medications: . amLODipine  5 mg Oral Daily  . gabapentin  600 mg Oral QID  . insulin aspart  0-15 Units Subcutaneous TID WC  . insulin aspart   0-5 Units Subcutaneous QHS  . mupirocin cream   Topical BID  . pantoprazole  40 mg Oral Daily  . sacubitril-valsartan  1 tablet Oral BID  . spironolactone  25 mg Oral Daily  . thiamine  100 mg Oral Daily  . varenicline  1 mg Oral Daily  . vitamin B-12  1,000 mcg Oral Daily  . zinc sulfate  220 mg Oral BID    Infusions: .  ceFAZolin (ANCEF) IV 2 g (04/23/20 7893)  . dextrose 5 % and 0.45% NaCl 50 mL/hr at 04/22/20 2121  . heparin 800 Units/hr (04/22/20 1602)  . norepinephrine (LEVOPHED) Adult infusion Stopped (04/19/20 1110)    PRN Medications: acetaminophen, bisacodyl, fentaNYL (SUBLIMAZE) injection, fluticasone, hydrALAZINE, Ipratropium-Albuterol, ondansetron (ZOFRAN) IV, oxyCODONE, sodium chloride flush, zolpidem   Assessment/Plan:    1. 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 He previously required debridement of lower sternalwound with woundVAC therapy and IV antibiotics.This was completed with 3 surgical procedures including debridement and ACell application in March 8101.  More recently, the lower sternal wound reopened with drainage again culturing MSSA and he had been on cephalexin + doxycycline.  The day of admission, he developed persistent bleeding from the wound site and came to ER, bleeding now stopped with suture.  Bleeding again 5/30, now stopped again. Got an additional 1 units PRBCs on 5/31 and again 6/1. Overall he has received 3 units PRBCs + 5 units FFP.  ID consulted and switched antibiotics to cefazolin.  1/2 blood cultures + for MSSA. CT chest/abd/pelvis--> Stable small amount of soft tissue tracking along the proximal drive line in the subcutaneous ventral left abdominal wall.  S/p I&D + wound vac placement on 6/1.   Back to OR for debridement and wound vac change on 6/4.  Afebrile.  - Continue cefazolin IV   - Plan for Thursday am muscle flap closure with plastic surgery.   - Coumadin on hold for  procedures. Continue IV heparin.  2. COVID-19 infection: Asymptomatic. No cough/dyspnea.  Repeat COVID-19 test negative 6/5. - He is off isolation.  3. 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 LVAD parameters stable.  Has been NYHA class II.  MAPs low initially, started on phenylephrine (may have been at least partly vagal as very diaphoretic with bleeding).  Now off phenylephrine and NE.  - Continue spironolactone 25 mg daily.  - Increase Entresto to home 97/103 bid dosing.  - Continue amlodipine 5 mg daily.  - He is on heparin gtt with need for surgery Thursday.  - Restart ASA 81.  - Increase speed back to 5700 rpm.  4. Smoking:He has cut back a lot, still taking Chantix. Encouraged to quit. 5. H/o RLE DVT: On warfarin for LVAD but held for wound exploration 6. OSA: Continue CPAP.  7. Hyperlipidemia: Atorvastatin.  8. Type II diabetes:SSI 9. Anemia, Acute blood loss: Hgb has stabilized.   Ambulate in halls.     I reviewed the LVAD parameters from today, and compared the results to the patient's prior recorded data.  No programming changes were made.  The LVAD is functioning within specified parameters.  The patient performs LVAD self-test daily.  LVAD interrogation was negative for any significant power changes, alarms or PI events/speed drops.  LVAD equipment check completed and is in good working order.  Back-up equipment present.   LVAD education done on emergency procedures and precautions and reviewed exit site care.  Length of Stay: 7  Marca Ancona 04/23/2020 9:48 AM  VAD Team --- VAD ISSUES ONLY--- Pager [phone] (7am - 7am)  Advanced Heart Failure Team  Pager [phone] (M-F; 7a - 4p)  Please contact [facility] for night-coverage after hours (4p -7a ) and weekends on [email or web address]

## 2020-04-23 NOTE — Progress Notes (Signed)
ANTICOAGULATION CONSULT NOTE   Pharmacy Consult for heparin - per TCTS dosing Indication: LVAD  Allergies  Allergen Reactions  . Chlorhexidine Rash  . Other Rash    Prep pads    Patient Measurements: Height: 5\' 11"  (180.3 cm) Weight: 91.5 kg (201 lb 11.5 oz) IBW/kg (Calculated) : 75.3 Heparin Dosing Weight: ~ 90 kg  Vital Signs: Temp: 97.6 F (36.4 C) (06/06 1053) Temp Source: Oral (06/06 1053) BP: 113/93 (06/06 1053) Pulse Rate: 89 (06/06 1053)  Labs: Recent Labs    04/21/20 0525 04/21/20 0525 04/22/20 0250 04/22/20 1303 04/23/20 0348  HGB 9.1*   < >  --  10.4* 10.3*  HCT 28.5*  --   --  31.4* 31.5*  PLT 173  --   --  221 238  LABPROT 13.7  --  13.2  --  12.5  INR 1.1  --  1.0  --  1.0  HEPARINUNFRC <0.10*  --  <0.10*  --  <0.10*  CREATININE 0.69  --   --  0.76 1.00   < > = values in this interval not displayed.    Estimated Creatinine Clearance: 89.8 mL/min (by C-G formula based on SCr of 1 mg/dL).   Medical History: Past Medical History:  Diagnosis Date  . 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 Cardiomyopathy, unspecified (HCC)   . CHF (congestive heart failure) (HCC)   . Chronic back pain   . Diabetes mellitus without complication (HCC)   . Enlarged heart   . Gastric ulcer   . Gastroenteritis   . H/O degenerative disc disease   . Hypertension   . LVAD (left ventricular assist device) present (HCC)     Medications:   Infusions:  .  ceFAZolin (ANCEF) IV 2 g (04/23/20 06/23/20)  . dextrose 5 % and 0.45% NaCl 50 mL/hr at 04/23/20 0800  . heparin 900 Units/hr (04/23/20 1300)  . norepinephrine (LEVOPHED) Adult infusion Stopped (04/19/20 1110)    Assessment: 59 yo male with LVAD, MSSA driveline infection.  Chronic Coumadin on hold for procedures.  Heparin started 6/3 at low dose.  Currently heparin level remains undetectable on 800 units/hr.  No overt bleeding or complications noted.  Goal of Therapy:  Monitor platelets by anticoagulation protocol:  Yes   Plan:  Increase IV Heparin to 900 units/hr per discussion with Dr. 77 No plans to titrate. Daily heparin level and CBC.  Donata Clay, Reece Leader, BCCP Clinical Pharmacist  04/23/2020 3:02 PM   [facility] pharmacy phone numbers are listed on [email or web address]

## 2020-04-24 ENCOUNTER — Inpatient Hospital Stay: Payer: Self-pay

## 2020-04-24 DIAGNOSIS — A4901 Methicillin susceptible Staphylococcus aureus infection, unspecified site: Secondary | ICD-10-CM

## 2020-04-24 DIAGNOSIS — I5043 Acute on chronic combined systolic (congestive) and diastolic (congestive) heart failure: Secondary | ICD-10-CM

## 2020-04-24 LAB — CBC
HCT: 31.3 % — ABNORMAL LOW (ref 39.0–52.0)
Hemoglobin: 10.2 g/dL — ABNORMAL LOW (ref 13.0–17.0)
MCH: 30.2 pg (ref 26.0–34.0)
MCHC: 32.6 g/dL (ref 30.0–36.0)
MCV: 92.6 fL (ref 80.0–100.0)
Platelets: 270 10*3/uL (ref 150–400)
RBC: 3.38 MIL/uL — ABNORMAL LOW (ref 4.22–5.81)
RDW: 13.8 % (ref 11.5–15.5)
WBC: 9.4 10*3/uL (ref 4.0–10.5)
nRBC: 0 % (ref 0.0–0.2)

## 2020-04-24 LAB — GLUCOSE, CAPILLARY
Glucose-Capillary: 176 mg/dL — ABNORMAL HIGH (ref 70–99)
Glucose-Capillary: 105 mg/dL — ABNORMAL HIGH (ref 70–99)
Glucose-Capillary: 102 mg/dL — ABNORMAL HIGH (ref 70–99)
Glucose-Capillary: 129 mg/dL — ABNORMAL HIGH (ref 70–99)

## 2020-04-24 LAB — BASIC METABOLIC PANEL
Anion gap: 7 (ref 5–15)
BUN: 10 mg/dL (ref 8–23)
CO2: 27 mmol/L (ref 22–32)
Calcium: 8.8 mg/dL — ABNORMAL LOW (ref 8.9–10.3)
Chloride: 109 mmol/L (ref 98–111)
Creatinine, Ser: 0.84 mg/dL (ref 0.61–1.24)
GFR calc Af Amer: >60 mL/min (ref >60)
GFR calc non Af Amer: >60 mL/min (ref >60)
Glucose, Bld: 142 mg/dL — ABNORMAL HIGH (ref 70–99)
Potassium: 4 mmol/L (ref 3.5–5.1)
Sodium: 143 mmol/L (ref 135–145)

## 2020-04-24 LAB — PROTIME-INR
INR: 1 (ref 0.8–1.2)
Prothrombin Time: 13 seconds (ref 11.4–15.2)

## 2020-04-24 LAB — TYPE AND SCREEN
ABO/RH(D): O POS
Antibody Screen: NEGATIVE
Unit division: 0
Unit division: 0

## 2020-04-24 LAB — LACTATE DEHYDROGENASE: LDH: 160 U/L (ref 98–192)

## 2020-04-24 LAB — BPAM RBC
Blood Product Expiration Date: 202107052359
Blood Product Expiration Date: 202107062359
Unit Type and Rh: 5100
Unit Type and Rh: 5100

## 2020-04-24 LAB — HEPARIN LEVEL (UNFRACTIONATED): Heparin Unfractionated: <0.1 IU/mL — ABNORMAL LOW (ref 0.30–0.70)

## 2020-04-24 MED ORDER — FUROSEMIDE 10 MG/ML IJ SOLN
40.0000 mg | Freq: Once | INTRAMUSCULAR | Status: AC
  Administered 2020-04-24: 40 mg via INTRAVENOUS
  Filled 2020-04-24: qty 4

## 2020-04-24 MED ORDER — POTASSIUM CHLORIDE CRYS ER 20 MEQ PO TBCR
40.0000 meq | EXTENDED_RELEASE_TABLET | Freq: Once | ORAL | Status: AC
  Administered 2020-04-24: 40 meq via ORAL
  Filled 2020-04-24: qty 2

## 2020-04-24 MED ORDER — HYDRALAZINE HCL 50 MG PO TABS
50.0000 mg | ORAL_TABLET | Freq: Three times a day (TID) | ORAL | Status: DC
  Administered 2020-04-24 – 2020-04-27 (×9): 50 mg via ORAL
  Filled 2020-04-24 (×9): qty 1

## 2020-04-24 NOTE — Progress Notes (Signed)
[facility]  Date of Admission:  04/16/2020     Total days of antibiotics 10         ASSESSMENT:  [name] is doing well and has remained afebrile with no acute events. Scheduled for pectoralis flap on Thursday. Blood cultures from 6/3 without growth to date and surgical specimens from 6/4 without growth. Continue current dose of Cefazolin. Will need 6 weeks of therapy followed by chronic suppression with cephalexin.    PLAN:  1. Continue Cefazolin 2. Wound care per CVTS and Plastic Surgery 3. Monitor cultures until finalized. 4. OK for central line placement as blood cultures are clear.   Active Problems:   Infection associated with driveline of left ventricular assist device (LVAD) (HCC)   Staph aureus infection   . amLODipine  5 mg Oral Daily  . aspirin  81 mg Oral Daily  . gabapentin  600 mg Oral QID  . insulin aspart  0-15 Units Subcutaneous TID WC  . insulin aspart  0-5 Units Subcutaneous QHS  . mupirocin cream   Topical BID  . pantoprazole  40 mg Oral Daily  . sacubitril-valsartan  1 tablet Oral BID  . spironolactone  25 mg Oral Daily  . thiamine  100 mg Oral Daily  . varenicline  1 mg Oral Daily  . vitamin B-12  1,000 mcg Oral Daily  . zinc sulfate  220 mg Oral BID    SUBJECTIVE:  Afebrile overnight with no acute events. Previous Sars-CoV-2 testing on Saturday negative and now off isolation. Feeling okay today with no new complaints.   Allergies  Allergen Reactions  . Chlorhexidine Rash  . Other Rash    Prep pads     Review of Systems: Review of Systems  Constitutional: Negative for chills, fever and weight loss.  Respiratory: Negative for cough, shortness of breath and wheezing.   Cardiovascular: Negative for chest pain and leg swelling.  Gastrointestinal: Negative for abdominal pain, constipation, diarrhea, nausea and vomiting.  Skin: Negative for rash.    OBJECTIVE: Vitals:   04/23/20 2234 04/24/20 0448  04/24/20 0500 04/24/20 0745  BP: (!) 119/93   102/82  Pulse: 80   83  Resp: 18   (!) 25  Temp: 98.1 F (36.7 C) 99.1 F (37.3 C)  98.6 F (37 C)  TempSrc: Oral Oral  Oral  SpO2: 100% 99%  100%  Weight:   91.1 kg   Height:       Body mass index is 28.01 kg/m.  Physical Exam Constitutional:      General: He is not in acute distress.    Appearance: He is well-developed.     Comments: Seated in the chair; pleasant.   Cardiovascular:     Rate and Rhythm: Normal rate and regular rhythm.     Comments: Wound VAC present and patent. Surgical site dressing clean and dry. No evidence of infection. LVAD hum present.  Pulmonary:     Effort: Pulmonary effort is normal.     Breath sounds: Normal breath sounds.  Skin:    General: Skin is warm and dry.  Neurological:     Mental Status: He is alert and oriented to person, place, and time.  Psychiatric:        Behavior: Behavior normal.        Thought Content: Thought content normal.        Judgment: Judgment normal.     Lab Results Lab Results  Component Value Date  WBC 9.4 04/24/2020   HGB 10.2 (L) 04/24/2020   HCT 31.3 (L) 04/24/2020   MCV 92.6 04/24/2020   PLT 270 04/24/2020    Lab Results  Component Value Date   CREATININE 0.84 04/24/2020   BUN 10 04/24/2020   NA 143 04/24/2020   K 4.0 04/24/2020   CL 109 04/24/2020   CO2 27 04/24/2020    Lab Results  Component Value Date   ALT 13 04/18/2020   AST 24 04/18/2020   ALKPHOS 54 04/18/2020   BILITOT 1.6 (H) 04/18/2020     Microbiology: Recent Results (from the past 240 hour(s))  SARS Coronavirus 2 by RT PCR (hospital order, performed in [facility] hospital lab) Nasopharyngeal Nasopharyngeal Swab     Status: Abnormal   Collection Time: 04/16/20  4:21 AM   Specimen: Nasopharyngeal Swab  Result Value Ref Range Status   SARS Coronavirus 2 POSITIVE (A) NEGATIVE Final    Comment: RESULT CALLED TO, READ BACK BY AND VERIFIED WITH: RN BRITTANY OLDLAND AT 0550 BY MESSAN  HOUEGNIFIO ON 04/16/2020 (NOTE) SARS-CoV-2 target nucleic acids are DETECTED SARS-CoV-2 RNA is generally detectable in upper respiratory specimens  during the acute phase of infection.  Positive results are indicative  of the presence of the identified virus, but do not rule out bacterial infection or co-infection with other pathogens not detected by the test.  Clinical correlation with patient history and  other diagnostic information is necessary to determine patient infection status.  The expected result is negative. Fact Sheet for Patients:   BoilerBrush.com.cy  Fact Sheet for Healthcare Providers:   https://pope.com/   This test is not yet approved or cleared by the [place] FDA and  has been authorized for detection and/or diagnosis of SARS-CoV-2 by FDA under an Emergency Use Authorization (EUA).  This EUA will remain in effe ct (meaning this test can be used) for the duration of  the COVID-19 declaration under Section 564(b)(1) of the Act, 21 U.S.C. section 360-bbb-3(b)(1), unless the authorization is terminated or revoked sooner. Performed at [facility], [address], [place], [place] [address]   Blood culture (routine x 2)     Status: Abnormal   Collection Time: 04/16/20  5:20 AM   Specimen: BLOOD RIGHT HAND  Result Value Ref Range Status   Specimen Description BLOOD RIGHT HAND  Final   Special Requests   Final    BOTTLES DRAWN AEROBIC AND ANAEROBIC Blood Culture adequate volume   Culture  Setup Time   Final    GRAM POSITIVE COCCI IN CLUSTERS ANAEROBIC BOTTLE ONLY CRITICAL RESULT CALLED TO, READ BACK BY AND VERIFIED WITH: PHARMD LAURA SEAY AT 0348 BY MESSAN HOUEGNIFIO ON 6 1 2021 Performed at [facility], [address], [place], [place] [address]    Culture STAPHYLOCOCCUS AUREUS (A)  Final   Report Status 04/20/2020 FINAL  Final   Organism ID, Bacteria STAPHYLOCOCCUS AUREUS  Final      Susceptibility    Staphylococcus aureus - MIC*    CIPROFLOXACIN <=0.5 SENSITIVE Sensitive     ERYTHROMYCIN >=8 RESISTANT Resistant     GENTAMICIN <=0.5 SENSITIVE Sensitive     OXACILLIN 0.5 SENSITIVE Sensitive     TETRACYCLINE 2 SENSITIVE Sensitive     VANCOMYCIN 1 SENSITIVE Sensitive     TRIMETH/SULFA <=10 SENSITIVE Sensitive     CLINDAMYCIN RESISTANT Resistant     RIFAMPIN <=0.5 SENSITIVE Sensitive     Inducible Clindamycin POSITIVE Resistant     *  STAPHYLOCOCCUS AUREUS  Blood Culture ID Panel (Reflexed)     Status: Abnormal   Collection Time: 04/16/20  5:20 AM  Result Value Ref Range Status   Enterococcus species NOT DETECTED NOT DETECTED Final   Listeria monocytogenes NOT DETECTED NOT DETECTED Final   Staphylococcus species DETECTED (A) NOT DETECTED Final    Comment: CRITICAL RESULT CALLED TO, READ BACK BY AND VERIFIED WITH: PHARMD LAURA SEAY AT 0348 BY MESSAN HOUEGNIFIO ON 6 1 2021    Staphylococcus aureus (BCID) DETECTED (A) NOT DETECTED Final    Comment: Methicillin (oxacillin) susceptible Staphylococcus aureus (MSSA). Preferred therapy is anti staphylococcal beta lactam antibiotic (Cefazolin or Nafcillin), unless clinically contraindicated. CRITICAL RESULT CALLED TO, READ BACK BY AND VERIFIED WITH: PHARMD LAURA SEAY AT 0348 BY MESSAN HOUEGNIFIO ON 6 1 2021    Methicillin resistance NOT DETECTED NOT DETECTED Final   Streptococcus species NOT DETECTED NOT DETECTED Final   Streptococcus agalactiae NOT DETECTED NOT DETECTED Final   Streptococcus pneumoniae NOT DETECTED NOT DETECTED Final   Streptococcus pyogenes NOT DETECTED NOT DETECTED Final   Acinetobacter baumannii NOT DETECTED NOT DETECTED Final   Enterobacteriaceae species NOT DETECTED NOT DETECTED Final   Enterobacter cloacae complex NOT DETECTED NOT DETECTED Final   Escherichia coli NOT DETECTED NOT DETECTED Final   Klebsiella oxytoca NOT DETECTED NOT DETECTED Final   Klebsiella pneumoniae NOT DETECTED NOT DETECTED Final   Proteus  species NOT DETECTED NOT DETECTED Final   Serratia marcescens NOT DETECTED NOT DETECTED Final   Haemophilus influenzae NOT DETECTED NOT DETECTED Final   Neisseria meningitidis NOT DETECTED NOT DETECTED Final   Pseudomonas aeruginosa NOT DETECTED NOT DETECTED Final   Candida albicans NOT DETECTED NOT DETECTED Final   Candida glabrata NOT DETECTED NOT DETECTED Final   Candida krusei NOT DETECTED NOT DETECTED Final   Candida parapsilosis NOT DETECTED NOT DETECTED Final   Candida tropicalis NOT DETECTED NOT DETECTED Final    Comment: Performed at [facility], [address] [place], [place] [address]  MRSA PCR Screening     Status: None   Collection Time: 04/16/20  8:27 AM   Specimen: Nasal Mucosa; Nasopharyngeal  Result Value Ref Range Status   MRSA by PCR NEGATIVE NEGATIVE Final    Comment:        The GeneXpert MRSA Assay (FDA approved for NASAL specimens only), is one component of a comprehensive MRSA colonization surveillance program. It is not intended to diagnose MRSA infection nor to guide or monitor treatment for MRSA infections. Performed at [facility], [address], [place], [place] [address]   Blood culture (routine x 2)     Status: None   Collection Time: 04/16/20  2:35 PM   Specimen: BLOOD  Result Value Ref Range Status   Specimen Description BLOOD RIGHT ANTECUBITAL  Final   Special Requests   Final    BOTTLES DRAWN AEROBIC AND ANAEROBIC Blood Culture adequate volume   Culture   Final    NO GROWTH 5 DAYS Performed at [facility], [address], [place], [place] [address]    Report Status 04/21/2020 FINAL  Final  SARS Coronavirus 2 by RT PCR (hospital order, performed in [facility] hospital lab) Nasopharyngeal Nasopharyngeal Swab     Status: None   Collection Time: 04/17/20 11:30 AM   Specimen: Nasopharyngeal Swab  Result Value Ref Range Status   SARS Coronavirus 2 NEGATIVE NEGATIVE Final    Comment: (NOTE) SARS-CoV-2 target nucleic  acids  are NOT DETECTED. The SARS-CoV-2 RNA is generally detectable in upper and lower respiratory specimens during the acute phase of infection. The lowest concentration of SARS-CoV-2 viral copies this assay can detect is 250 copies / mL. A negative result does not preclude SARS-CoV-2 infection and should not be used as the sole basis for treatment or other patient management decisions.  A negative result may occur with improper specimen collection / handling, submission of specimen other than nasopharyngeal swab, presence of viral mutation(s) within the areas targeted by this assay, and inadequate number of viral copies (<250 copies / mL). A negative result must be combined with clinical observations, patient history, and epidemiological information. Fact Sheet for Patients:   BoilerBrush.com.cy Fact Sheet for Healthcare Providers: https://pope.com/ This test is not yet approved or cleared  by the [place] FDA and has been authorized for detection and/or diagnosis of SARS-CoV-2 by FDA under an Emergency Use Authorization (EUA).  This EUA will remain in effect (meaning this test can be used) for the duration of the COVID-19 declaration under Section 564(b)(1) of the Act, 21 U.S.C. section 360bbb-3(b)(1), unless the authorization is terminated or revoked sooner. Performed at [facility], [address] [place], [place] [address]   SARS Coronavirus 2 by RT PCR (hospital order, performed in [facility] hospital lab) Nasopharyngeal Nasal Mucosa     Status: Abnormal   Collection Time: 04/18/20  4:14 AM   Specimen: Nasal Mucosa; Nasopharyngeal  Result Value Ref Range Status   SARS Coronavirus 2 POSITIVE (A) NEGATIVE Final    Comment: RESULT CALLED TO, READ BACK BY AND VERIFIED WITH: RN AMO MELVIN AT 0612 ON 04/18/2020 BY MESSAN HOUEGNIFIO (NOTE) SARS-CoV-2 target nucleic acids are DETECTED SARS-CoV-2 RNA is generally detectable in  upper respiratory specimens  during the acute phase of infection.  Positive results are indicative  of the presence of the identified virus, but do not rule out bacterial infection or co-infection with other pathogens not detected by the test.  Clinical correlation with patient history and  other diagnostic information is necessary to determine patient infection status.  The expected result is negative. Fact Sheet for Patients:   BoilerBrush.com.cy  Fact Sheet for Healthcare Providers:   https://pope.com/   This test is not yet approved or cleared by the [place] FDA and  has been authorized for detection and/or diagnosis of SARS-CoV-2 by FDA under an Emergency Use Authorization (EUA).  This EUA will remain in effect (mea ning this test can be used) for the duration of  the COVID-19 declaration under Section 564(b)(1) of the Act, 21 U.S.C. section 360-bbb-3(b)(1), unless the authorization is terminated or revoked sooner. Performed at [facility], [address] [place], [place] [address]   Surgical pcr screen     Status: None   Collection Time: 04/18/20  4:17 AM   Specimen: Nasal Mucosa; Nasal Swab  Result Value Ref Range Status   MRSA, PCR NEGATIVE NEGATIVE Final   Staphylococcus aureus NEGATIVE NEGATIVE Final    Comment: (NOTE) The Xpert SA Assay (FDA approved for NASAL specimens in patients 40 years of age and older), is one component of a comprehensive surveillance program. It is not intended to diagnose infection nor to guide or monitor treatment. Performed at [facility], [address], [place], [place] [address]   Aerobic/Anaerobic Culture (surgical/deep wound)     Status: None   Collection Time: 04/18/20 12:14 PM   Specimen: Wound  Result Value Ref Range Status  Specimen Description WOUND  Final   Special Requests STERUM  Final   Gram Stain   Final    ABUNDANT WBC PRESENT, PREDOMINANTLY PMN NO  ORGANISMS SEEN    Culture   Final    FEW STAPHYLOCOCCUS AUREUS NO ANAEROBES ISOLATED Performed at [facility], [address], [place], [place] [address]    Report Status 04/23/2020 FINAL  Final   Organism ID, Bacteria STAPHYLOCOCCUS AUREUS  Final      Susceptibility   Staphylococcus aureus - MIC*    CIPROFLOXACIN <=0.5 SENSITIVE Sensitive     ERYTHROMYCIN >=8 RESISTANT Resistant     GENTAMICIN <=0.5 SENSITIVE Sensitive     OXACILLIN 0.5 SENSITIVE Sensitive     TETRACYCLINE 2 SENSITIVE Sensitive     VANCOMYCIN 1 SENSITIVE Sensitive     TRIMETH/SULFA <=10 SENSITIVE Sensitive     CLINDAMYCIN RESISTANT Resistant     RIFAMPIN <=0.5 SENSITIVE Sensitive     Inducible Clindamycin POSITIVE Resistant     * FEW STAPHYLOCOCCUS AUREUS  Culture, blood (Routine X 2) w Reflex to ID Panel     Status: None (Preliminary result)   Collection Time: 04/20/20 11:22 PM   Specimen: BLOOD  Result Value Ref Range Status   Specimen Description BLOOD RT IJ  Final   Special Requests   Final    BOTTLES DRAWN AEROBIC ONLY Blood Culture adequate volume   Culture   Final    NO GROWTH 4 DAYS Performed at [facility], [address], [place], [place] [address]    Report Status PENDING  Incomplete  Culture, blood (Routine X 2) w Reflex to ID Panel     Status: None (Preliminary result)   Collection Time: 04/20/20 11:25 PM   Specimen: BLOOD  Result Value Ref Range Status   Specimen Description BLOOD LEFT ANTECUBITAL  Final   Special Requests   Final    BOTTLES DRAWN AEROBIC AND ANAEROBIC Blood Culture adequate volume   Culture   Final    NO GROWTH 4 DAYS Performed at [facility], [address] [place], [place] [address]    Report Status PENDING  Incomplete  Aerobic/Anaerobic Culture (surgical/deep wound)     Status: None (Preliminary result)   Collection Time: 04/21/20  4:11 PM   Specimen: Wound  Result Value Ref Range Status   Specimen Description WOUND  Final   Special Requests  LVAD DRIVE LINE PT ON CEFAZOLIN  Final   Gram Stain   Final    FEW WBC PRESENT, PREDOMINANTLY PMN NO ORGANISMS SEEN    Culture   Final    NO GROWTH 2 DAYS Performed at [facility], [address] [place], [place] [address]    Report Status PENDING  Incomplete  SARS Coronavirus 2 by RT PCR (hospital order, performed in [facility] hospital lab) Nasopharyngeal Nasopharyngeal Swab     Status: None   Collection Time: 04/22/20  8:48 AM   Specimen: Nasopharyngeal Swab  Result Value Ref Range Status   SARS Coronavirus 2 NEGATIVE NEGATIVE Final    Comment: (NOTE) SARS-CoV-2 target nucleic acids are NOT DETECTED. The SARS-CoV-2 RNA is generally detectable in upper and lower respiratory specimens during the acute phase of infection. The lowest concentration of SARS-CoV-2 viral copies this assay can detect is 250 copies / mL. A negative result does not preclude SARS-CoV-2 infection and should not be used as the sole basis for treatment or other patient management decisions.  A negative result  may occur with improper specimen collection / handling, submission of specimen other than nasopharyngeal swab, presence of viral mutation(s) within the areas targeted by this assay, and inadequate number of viral copies (<250 copies / mL). A negative result must be combined with clinical observations, patient history, and epidemiological information. Fact Sheet for Patients:   StrictlyIdeas.no Fact Sheet for Healthcare Providers: BankingDealers.co.za This test is not yet approved or cleared  by the [place] FDA and has been authorized for detection and/or diagnosis of SARS-CoV-2 by FDA under an Emergency Use Authorization (EUA).  This EUA will remain in effect (meaning this test can be used) for the duration of the COVID-19 declaration under Section 564(b)(1) of the Act, 21 U.S.C. section 360bbb-3(b)(1), unless the authorization is terminated  or revoked sooner. Performed at [facility], [place] [address], [place], [place] [address]      Terri Piedra, [place] [facility]  04/24/2020  9:53 AM

## 2020-04-24 NOTE — Progress Notes (Addendum)
Patient ID: KALUP JAQUITH, male   DOB: December 31, 1960, 59 y.o.   MRN: [id number]   Advanced Heart Failure VAD Team Note  PCP-Cardiologist: No primary care provider on file.   Subjective:    Here w/ subxiphoid abscess and cellulitis + bleeding from wound site. Admit Hgb 7.4. COVID +.   Speed increased back to 5700.   S/p multiple transfusions. Has received total of 3 units PRBCs and 5 units FFP. Hgb stabilized.   Blood cultures 1/2 positive for MSSA.  On cefazolin.    Went to OR 6/1 for I&D of chest wound + repair of VAD outflow graft. Required fem-fem cardiopulmonary bypass. Wound vac placed. Back to OR 6/4 for debridement and wound vac change.   Repeat COVID-19 test negative 6/5.   Feeling ok. Denies SOB. Complaining of leg edema.    LVAD INTERROGATION:  HeartMate III LVAD:   Flow 4.5   liters/min, speed 5700, power 5 PI 3.6      Objective:    Vital Signs:   Temp:  [97.6 F (36.4 C)-99.1 F (37.3 C)] 98.6 F (37 C) (06/07 0745) Pulse Rate:  [74-89] 83 (06/07 0745) Resp:  [16-26] 25 (06/07 0745) BP: (102-119)/(82-93) 102/82 (06/07 0745) SpO2:  [98 %-100 %] 100 % (06/07 0745) Weight:  [91.1 kg] 91.1 kg (06/07 0500) Last BM Date: 04/23/20 Mean arterial Pressure 90s  Intake/Output:   Intake/Output Summary (Last 24 hours) at 04/24/2020 0945 Last data filed at 04/24/2020 0457 Gross per 24 hour  Intake 1746.3 ml  Output 825 ml  Net 921.3 ml     Physical Exam    Physical Exam: GENERAL: no acute distress. Sitting in the chair.  HEENT: normal  NECK: Supple, JVP 11-12   .  2+ bilaterally, no bruits.  No lymphadenopathy or thyromegaly appreciated.  RIJ  CARDIAC:  Mechanical heart sounds with LVAD hum present. VAC dressing in place.  LUNGS:  Clear to auscultation bilaterally.  ABDOMEN:  Soft, round, nontender, positive bowel sounds x4.     LVAD exit site: Dressing dry and intact.  No erythema or drainage.  Stabilization device present and accurately applied.  Driveline dressing  is being changed daily per sterile technique. EXTREMITIES:  Warm and dry, no cyanosis, clubbing, rash. R and LLE 1+  edema  NEUROLOGIC:  Alert and oriented x 3.  No aphasia.  No dysarthria.  Affect pleasant.       Telemetry  NSR   EKG   n/a   Labs   Basic Metabolic Panel: Recent Labs  Lab 04/20/20 0500 04/20/20 0500 04/21/20 0525 04/21/20 0525 04/22/20 1303 04/23/20 0348 04/24/20 0455  NA 140  --  143  --  140 143 143  K 3.9  --  3.8  --  3.9 3.9 4.0  CL 106  --  105  --  105 107 109  CO2 28  --  28  --  27 25 27   GLUCOSE 109*  --  121*  --  146* 127* 142*  BUN 10  --  8  --  9 11 10   CREATININE 0.76  --  0.69  --  0.76 1.00 0.84  CALCIUM 8.3*   < > 8.6*   < > 9.3 8.8* 8.8*   < > = values in this interval not displayed.    Liver Function Tests: Recent Labs  Lab 04/18/20 1709  AST 24  ALT 13  ALKPHOS 54  BILITOT 1.6*  PROT 4.6*  ALBUMIN 2.3*   No  results for input(s): LIPASE, AMYLASE in the last 168 hours. No results for input(s): AMMONIA in the last 168 hours.  CBC: Recent Labs  Lab 04/20/20 0500 04/21/20 0525 04/22/20 1303 04/23/20 0348 04/24/20 0455  WBC 10.8* 7.6 14.3* 9.8 9.4  HGB 9.7* 9.1* 10.4* 10.3* 10.2*  HCT 30.0* 28.5* 31.4* 31.5* 31.3*  MCV 91.7 92.8 90.5 92.1 92.6  PLT 151 173 221 238 270    INR: Recent Labs  Lab 04/20/20 0500 04/21/20 0525 04/22/20 0250 04/23/20 0348 04/24/20 0455  INR 1.2 1.1 1.0 1.0 1.0    Other results:  EKG:    Imaging   [name] EKG SITE RITE  Result Date: 04/22/2020 If Site Rite image not attached, placement could not be confirmed due to current cardiac rhythm.    Medications:     Scheduled Medications: . amLODipine  5 mg Oral Daily  . aspirin  81 mg Oral Daily  . gabapentin  600 mg Oral QID  . insulin aspart  0-15 Units Subcutaneous TID WC  . insulin aspart  0-5 Units Subcutaneous QHS  . mupirocin cream   Topical BID  . pantoprazole  40 mg Oral Daily  . sacubitril-valsartan  1 tablet Oral  BID  . spironolactone  25 mg Oral Daily  . thiamine  100 mg Oral Daily  . varenicline  1 mg Oral Daily  . vitamin B-12  1,000 mcg Oral Daily  . zinc sulfate  220 mg Oral BID    Infusions: .  ceFAZolin (ANCEF) IV 2 g (04/24/20 0617)  . dextrose 5 % and 0.45% NaCl 50 mL/hr at 04/23/20 1959  . heparin 900 Units/hr (04/23/20 1600)  . norepinephrine (LEVOPHED) Adult infusion Stopped (04/19/20 1110)    PRN Medications: acetaminophen, bisacodyl, fentaNYL (SUBLIMAZE) injection, fluticasone, hydrALAZINE, Ipratropium-Albuterol, ondansetron (ZOFRAN) IV, oxyCODONE, sodium chloride flush, zolpidem   Assessment/Plan:    1. 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 He previously required debridement of lower sternalwound with woundVAC therapy and IV antibiotics.This was completed with 3 surgical procedures including debridement and ACell application in March 2021.  More recently, the lower sternal wound reopened with drainage again culturing MSSA and he had been on cephalexin + doxycycline.  The day of admission, he developed persistent bleeding from the wound site and came to ER, bleeding now stopped with suture.  Bleeding again 5/30, now stopped again. Got an additional 1 units PRBCs on 5/31 and again 6/1. Overall he has received 3 units PRBCs + 5 units FFP.  ID consulted and switched antibiotics to cefazolin.  1/2 blood cultures + for MSSA. CT chest/abd/pelvis--> Stable small amount of soft tissue tracking along the proximal drive line in the subcutaneous ventral left abdominal wall.  S/p I&D + wound vac placement on 6/1.   Back to OR for debridement and wound vac change on 6/4.   - Continue cefazolin IV   - Plan for Thursday am muscle flap closure with plastic surgery.   - Coumadin on hold for procedures. Continue IV heparin.  2. COVID-19 infection: Asymptomatic. No cough/dyspnea.  Repeat COVID-19 test negative 6/5. - He is off isolation.     3. 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 LVAD parameters stable.  Has been NYHA class II.  MAPs low initially, started on phenylephrine (may have been at least partly vagal as very diaphoretic with bleeding).  Now off phenylephrine and NE.  - Volume status elevated. Give 40 mg IV  lasix 1.  - Continue spironolactone 25 mg daily.  - Continue Entresto to home 97/103 bid dosing.  - Continue amlodipine 5 mg daily would not increase with leg edema.  - May need to add hydralazine. He was on  hydralazine 50 mg tid prior to admit.   - He is on heparin gtt with need for surgery Thursday.  - Continue ASA 81.  - Continue  5700 rpm.  4. Smoking:He has cut back a lot, still taking Chantix. Encouraged to quit. 5. H/o RLE DVT: On warfarin for LVAD but held for wound exploration 6. OSA: Continue CPAP.  7. Hyperlipidemia: Atorvastatin.  8. Type II diabetes:SSI 9. Anemia, Acute blood loss: Hgb has stabilized.   PICC line today. Continue to diurese.   I reviewed the LVAD parameters from today, and compared the results to the patient's prior recorded data.  No programming changes were made.  The LVAD is functioning within specified parameters.  The patient performs LVAD self-test daily.  LVAD interrogation was negative for any significant power changes, alarms or PI events/speed drops.  LVAD equipment check completed and is in good working order.  Back-up equipment present.   LVAD education done on emergency procedures and precautions and reviewed exit site care.  Length of Stay: 8  Amy Clegg NP-C  04/24/2020 9:45 AM  VAD Team --- VAD ISSUES ONLY--- Pager [phone] (7am - 7am)  Advanced Heart Failure Team  Pager [phone] (M-F; 7a - 4p)  Please contact [facility] for night-coverage after hours (4p -7a ) and weekends on [email or web address]  Patient seen with NP, agree with the above note.   Stable today, afebrile. Hemoglobin stable.    General: Well  appearing this am. NAD.  HEENT: Normal. Neck: Supple, JVP 8-9 cm. Carotids OK.  Cardiac:  Mechanical heart sounds with LVAD hum present.  Lungs:  CTAB, normal effort.  Abdomen:  NT, ND, no HSM. No bruits or masses. +BS  LVAD exit site: Well-healed and incorporated. Dressing dry and intact. No erythema or drainage. Stabilization device present and accurately applied. Driveline dressing changed daily per sterile technique. Extremities:  Warm and dry. No cyanosis, clubbing, rash. 1+ ankle edema.  Neuro:  Alert & oriented x 3. Cranial nerves grossly intact. Moves all 4 extremities w/o difficulty. Affect pleasant    MAP elevated in 90s today, will restart home hydralazine 50 mg tid.   Lasix 40 mg IV x 1 today with mild volume overload.   Remains on cefazolin, ID contemplating rifampin/rifabutin.  Place PICC and remove IJ CVL.   Plan for muscle flap on Thursday.  Cover with heparin gtt until then.   Marca Ancona 04/24/2020 11:35 AM

## 2020-04-24 NOTE — Progress Notes (Signed)
VAST consulted to check established CVC after initial order to place PICC was placed. Contacted unit RN via SecureChat regarding order; she stated, "physician wants CL removed once PICC placed". VAST RN educated proper order to place and advised current order would be discontinued. Foye Clock, pt's nurse then asked if pt's PICC would be placed today as pt is complaining about CL in his neck. Advised that PICC team (2 RN's) has been extremely busy today and both PICC RN's are currently on separate campuses, so it is unlikely. Also advised there is not a PICC RN available after 7pm tonight.  Foye Clock responded with " Amy Clegg the NP with the HF team asked someone from your team earlier today if they could, they told her it would be done today". VAST RN responded, I can't speak to that! We only have 2 PICC RN's at any given time maximum; currently one is at [facility] and other one is at [facility]. They have to prioritize the PICC's not only by time the order was placed, but also by why it is needed. When other access is present, sometimes those pt's have to wait. Unfortunately, I don't know the exact issues for them today.

## 2020-04-24 NOTE — Progress Notes (Signed)
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   HM III LVAD implanted on 08/03/18 by Dr. Laneta Simmers under Destination Therapy criteria due to smoking status.  Pt awake, sitting up in recliner. States he walked in the hallway this morning. Reports good appetite. Had pain overnight and received IV pain medication at that time. Denies pain currently. Plan for possible muscle flap on Thursday with Dr Ulice Bold and Dr Donata Clay.   Vital signs: Temp: 98.8 HR: 96 Doppler Pressure: 98 Auto cuff: 102/84 (91) O2 Sat: 100% on RA Wt: 197>189>189>203.7>208.7>207.6>200.8 lbs   LVAD interrogation reveals:  Speed: 5500 Flow: 3.9 Power: 4.4w PI: 5.7  Alarms: none Events: none Hematocrit: 32  Fixed speed: 5500 Low speed limit: 5200  Drive Line:  Existing VAD dressing removed and site care performed using sterile technique. Drive line exit site cleaned with betadine swab x 2 (patient has CHG allergy), allowed to dry, packed with Dakin solution soaked iodoform packing guaze, and covered with sterile gauze dressing. Exit site open with velour exposed. Site with minimal serosanguinous drainage. No redness, tenderness, rash, or foul odor noted. Drive line anchor re-applied. Daily dressings per bedside RN or VAD coordinator. Next dressing change due: 04/25/20.       Labs:  LDH trend: 182>194>140>252>191>162>160  INR trend: 2.5>1.9>1.5>1.4>1.3>1.2>1.1>1.0  Anticoagulation Plan: -INR Goal: 2.0 - 2.5 - warfarin on hold -ASA Dose: 81 mg daily - on hold  Blood Products:  - 04/16/20>FFP x 2; PRBC x 1 - 04/17/20>>FFP x 1; PRBC x 1  Intra-op: - 04/18/20>>PRBC x 6; Plts x 2; FFP x 2; Cell saver 2330 ml  Device: N/A  Respiratory: intubated intra op 04/18/20 - extubated 04/19/20 am   Infection:  - 04/16/20>>Covid positive - 04/16/20>>BCs staph aureus - 04/18/20>>sternal wound culture intra-op>>staph aureus - 04/20/20>>BCs pending - 04/22/20>> COVID  negative  Gtts: - Heparin 700 units/hr   Plan/Recommendations:  1. Call VAD Coordinator if any VAD equipment or drive line issues. 2. Daily drive line dressing change  Alyce Pagan RN VAD Coordinator  Office: [phone]  24/7 Pager: [phone]

## 2020-04-24 NOTE — Progress Notes (Signed)
ANTICOAGULATION CONSULT NOTE   Pharmacy Consult for heparin - per TCTS dosing Indication: LVAD  Allergies  Allergen Reactions  . Chlorhexidine Rash  . Other Rash    Prep pads    Patient Measurements: Height: 5\' 11"  (180.3 cm) Weight: 91.1 kg (200 lb 13.4 oz) IBW/kg (Calculated) : 75.3 Heparin Dosing Weight: ~ 90 kg  Vital Signs: Temp: 98.6 F (37 C) (06/07 0745) Temp Source: Oral (06/07 0745) BP: 102/82 (06/07 0745) Pulse Rate: 83 (06/07 0745)  Labs: Recent Labs    04/22/20 0250 04/22/20 0250 04/22/20 1303 04/23/20 0348 04/23/20 2122 04/24/20 0455  HGB  --    < > 10.4* 10.3*  --  10.2*  HCT  --   --  31.4* 31.5*  --  31.3*  PLT  --   --  221 238  --  270  LABPROT 13.2  --   --  12.5  --  13.0  INR 1.0  --   --  1.0  --  1.0  HEPARINUNFRC <0.10*   < >  --  <0.10* <0.10* <0.10*  CREATININE  --   --  0.76 1.00  --  0.84   < > = values in this interval not displayed.    Estimated Creatinine Clearance: 106.6 mL/min (by C-G formula based on SCr of 0.84 mg/dL).   Medical History: Past Medical History:  Diagnosis Date  . 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 Cardiomyopathy, unspecified (HCC)   . CHF (congestive heart failure) (HCC)   . Chronic back pain   . Diabetes mellitus without complication (HCC)   . Enlarged heart   . Gastric ulcer   . Gastroenteritis   . H/O degenerative disc disease   . Hypertension   . LVAD (left ventricular assist device) present (HCC)     Medications:   Infusions:  .  ceFAZolin (ANCEF) IV 2 g (04/24/20 0617)  . dextrose 5 % and 0.45% NaCl 50 mL/hr at 04/23/20 1959  . heparin 900 Units/hr (04/23/20 1600)  . norepinephrine (LEVOPHED) Adult infusion Stopped (04/19/20 1110)    Assessment: 59 yo male with LVAD - HM3 + ASA 81mg  daily, MSSA bacteremia and  driveline infection.  Chronic Coumadin on hold for procedures - plan flap 6/10.  Heparin started 6/3 at low dose.  Currently heparin level remains undetectable on 900 units/hr. -  No titrations per MD for now  No overt bleeding or complications noted.  Goal of Therapy:  Monitor platelets by anticoagulation protocol: Yes   Plan:  Continue IV Heparin  900 units/hr per discussion with Dr. 77 No plans to titrate. Daily heparin level and CBC. Follow up post procedure completion to restart PTA warfarin  Pharm.D. CPP, BCPS Clinical Pharmacist [phone] 04/24/2020 10:24 AM     [facility] pharmacy phone numbers are listed on [email or web address]

## 2020-04-25 LAB — GLUCOSE, CAPILLARY
Glucose-Capillary: 142 mg/dL — ABNORMAL HIGH (ref 70–99)
Glucose-Capillary: 125 mg/dL — ABNORMAL HIGH (ref 70–99)
Glucose-Capillary: 97 mg/dL (ref 70–99)
Glucose-Capillary: 100 mg/dL — ABNORMAL HIGH (ref 70–99)

## 2020-04-25 LAB — BASIC METABOLIC PANEL
Anion gap: 8 (ref 5–15)
BUN: 5 mg/dL — ABNORMAL LOW (ref 8–23)
CO2: 27 mmol/L (ref 22–32)
Calcium: 9 mg/dL (ref 8.9–10.3)
Chloride: 107 mmol/L (ref 98–111)
Creatinine, Ser: 0.72 mg/dL (ref 0.61–1.24)
GFR calc Af Amer: >60 mL/min (ref >60)
GFR calc non Af Amer: >60 mL/min (ref >60)
Glucose, Bld: 131 mg/dL — ABNORMAL HIGH (ref 70–99)
Potassium: 4 mmol/L (ref 3.5–5.1)
Sodium: 142 mmol/L (ref 135–145)

## 2020-04-25 LAB — CULTURE, BLOOD (ROUTINE X 2)
Culture: NO GROWTH
Culture: NO GROWTH
Special Requests: ADEQUATE
Special Requests: ADEQUATE

## 2020-04-25 LAB — CBC
HCT: 31.3 % — ABNORMAL LOW (ref 39.0–52.0)
Hemoglobin: 10.1 g/dL — ABNORMAL LOW (ref 13.0–17.0)
MCH: 30 pg (ref 26.0–34.0)
MCHC: 32.3 g/dL (ref 30.0–36.0)
MCV: 92.9 fL (ref 80.0–100.0)
Platelets: 292 10*3/uL (ref 150–400)
RBC: 3.37 MIL/uL — ABNORMAL LOW (ref 4.22–5.81)
RDW: 13.9 % (ref 11.5–15.5)
WBC: 8.7 10*3/uL (ref 4.0–10.5)
nRBC: 0 % (ref 0.0–0.2)

## 2020-04-25 LAB — PROTIME-INR
INR: 1 (ref 0.8–1.2)
Prothrombin Time: 12.4 seconds (ref 11.4–15.2)

## 2020-04-25 LAB — HEPARIN LEVEL (UNFRACTIONATED): Heparin Unfractionated: <0.1 IU/mL — ABNORMAL LOW (ref 0.30–0.70)

## 2020-04-25 LAB — LACTATE DEHYDROGENASE: LDH: 168 U/L (ref 98–192)

## 2020-04-25 MED ORDER — RIFABUTIN 150 MG PO CAPS
300.0000 mg | ORAL_CAPSULE | Freq: Every day | ORAL | Status: DC
  Administered 2020-04-25 – 2020-05-06 (×11): 300 mg via ORAL
  Filled 2020-04-25 (×12): qty 2

## 2020-04-25 NOTE — Progress Notes (Signed)
R IJ removed per order after placement of PICC line. Vaseline gauze dressing to site, pt instructed to leave in place for 24hrs. Pt tolerated well, no s/s of distress, catheter intact upon removal

## 2020-04-25 NOTE — TOC Benefit Eligibility Note (Signed)
Transition of Care [facility]) Benefit Eligibility Note    Patient Details  Name: Theodore Welch MRN: [id number] Date of Birth: 1961-03-27   Medication/Dose: rifabutin and rifampin  Covered?: Yes  Tier: 3 Drug(and 4)  Prescription Coverage Preferred Pharmacy: [facility]  Spoke with Person/Company/Phone Number:: Courtland G. W[employer] PH# [phone]  Co-Pay: $1.30  30 or 90 day supply  Prior Approval: No  Deductible: Met       Shelda Altes Phone Number: 04/25/2020, 8:31 AM

## 2020-04-25 NOTE — Progress Notes (Signed)
   Called by staff nurse and VAD coordinator for increased sob.   Ok this morning but now SOB sitting on the side of the bed.    Suspect he has some volume overload.  Set up CVP.  Start lasix 40 mg IV twice a day.   Eppie Barhorst NP-C  10:15 AM

## 2020-04-25 NOTE — Progress Notes (Addendum)
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   HM III LVAD implanted on 08/03/18 by Dr. Laneta Simmers under Destination Therapy criteria due to smoking status.  Pt sitting up on side of bed. C/o severe left sided chest pain that he describes as "sharp." He is conversationally short of breath. Abdomen is distended. Reports bowel movement overnight. Tonye Becket NP to bedside- will start Lasix 40 mg IV BID for volume overload. Bedside RN to set up CVP.    Vital signs: Temp: 98.7 HR: 93 Doppler Pressure: 96 Auto cuff: 116/83 (95) O2 Sat: 98% on RA Wt: 197>189>189>203.7>208.7>207.6>200.8>200.6 lbs   LVAD interrogation reveals:  Speed: 5500 Flow: 4.3 Power: 4.5w PI: 4.3  Alarms: none Events: none Hematocrit: 32  Fixed speed: 5500 Low speed limit: 5200  Drive Line: Dressing change per bedside RN today. Next dressing change due: 04/26/20.    Labs:  LDH trend: 182>194>140>252>191>162>160>168  INR trend: 2.5>1.9>1.5>1.4>1.3>1.2>1.1>1.0>1.0  Anticoagulation Plan: -INR Goal: 2.0 - 2.5 - warfarin on hold -ASA Dose: 81 mg daily - on hold  Blood Products:  - 04/16/20>FFP x 2; PRBC x 1 - 04/17/20>>FFP x 1; PRBC x 1  Intra-op: - 04/18/20>>PRBC x 6; Plts x 2; FFP x 2; Cell saver 2330 ml  Device: N/A  Respiratory: intubated intra op 04/18/20 - extubated 04/19/20 am   Infection:  - 04/16/20>>Covid positive - 04/16/20>>BCs staph aureus - 04/18/20>>sternal wound culture intra-op>>staph aureus - 04/20/20>>BCs pending - 04/22/20>> COVID negative  Gtts: - Heparin 700 units/hr   Plan/Recommendations:  1. Call VAD Coordinator if any VAD equipment or drive line issues. 2. Daily drive line dressing change per bedside RN  Alyce Pagan RN VAD Coordinator  Office: [phone]  24/7 Pager: [phone]

## 2020-04-25 NOTE — Plan of Care (Signed)

## 2020-04-25 NOTE — Progress Notes (Addendum)
Patient ID: Theodore Welch, male   DOB: [birth year], 59 y.o.   MRN: [id number]   Advanced Heart Failure VAD Team Note  PCP-Cardiologist: No primary care provider on file.   Subjective:    Here w/ subxiphoid abscess and cellulitis + bleeding from wound site. Admit Hgb 7.4. COVID +.   Speed increased back to 5700.   S/p multiple transfusions. Has received total of 3 units PRBCs and 5 units FFP. Hgb stabilized.   Blood cultures 1/2 positive for MSSA.  On cefazolin.    Went to OR 6/1 for I&D of chest wound + repair of VAD outflow graft. Required fem-fem cardiopulmonary bypass. Wound vac placed. Back to OR 6/4 for debridement and wound vac change.   Repeat COVID-19 test negative 6/5.   Yesterday given 40 mg IV lasix . Negative > 3 liters.   Denies SOB.   LVAD INTERROGATION:  HeartMate III LVAD:   Flow 4.5   liters/min, speed 5700, power 4 PI 3.2    Objective:    Vital Signs:   Temp:  [97.9 F (36.6 C)-98.9 F (37.2 C)] 98.9 F (37.2 C) (06/08 0310) Pulse Rate:  [65-80] 80 (06/07 2309) Resp:  [14-25] 19 (06/08 0310) BP: (98-115)/(80-92) 98/80 (06/08 0310) SpO2:  [97 %-100 %] 97 % (06/08 0310) Weight:  [91 kg] 91 kg (06/08 0310) Last BM Date: 04/24/20 Mean arterial Pressure 80s  Intake/Output:   Intake/Output Summary (Last 24 hours) at 04/25/2020 0819 Last data filed at 04/25/2020 0640 Gross per 24 hour  Intake 555.98 ml  Output 3675 ml  Net -3119.02 ml     Physical Exam    Physical Exam: GENERAL: No acute distress. HEENT: normal  NECK: Supple, JVP 6-7   .  2+ bilaterally, no bruits.  No lymphadenopathy or thyromegaly appreciated.   CARDIAC:  Mechanical heart sounds with LVAD hum present.  VAC dressing in place.  LUNGS:  Clear to auscultation bilaterally.  ABDOMEN:  Soft, round, nontender, positive bowel sounds x4.     LVAD exit site: Dressing dry and intact.  Stabilization device present and accurately applied.  Driveline dressing is being changed daily per sterile  technique. EXTREMITIES:  Warm and dry, no cyanosis, clubbing, rash or edema  NEUROLOGIC:  Alert and oriented x 3  No aphasia.  No dysarthria.  Affect pleasant.       Telemetry  NSR   EKG   n/a   Labs   Basic Metabolic Panel: Recent Labs  Lab 04/21/20 0525 04/21/20 0525 04/22/20 1303 04/22/20 1303 04/23/20 0348 04/24/20 0455 04/25/20 0540  NA 143  --  140  --  143 143 142  K 3.8  --  3.9  --  3.9 4.0 4.0  CL 105  --  105  --  107 109 107  CO2 28  --  27  --  25 27 27   GLUCOSE 121*  --  146*  --  127* 142* 131*  BUN 8  --  9  --  11 10 5*  CREATININE 0.69  --  0.76  --  1.00 0.84 0.72  CALCIUM 8.6*   < > 9.3   < > 8.8* 8.8* 9.0   < > = values in this interval not displayed.    Liver Function Tests: Recent Labs  Lab 04/18/20 1709  AST 24  ALT 13  ALKPHOS 54  BILITOT 1.6*  PROT 4.6*  ALBUMIN 2.3*   No results for input(s): LIPASE, AMYLASE in the last 168 hours.  No results for input(s): AMMONIA in the last 168 hours.  CBC: Recent Labs  Lab 04/21/20 0525 04/22/20 1303 04/23/20 0348 04/24/20 0455 04/25/20 0540  WBC 7.6 14.3* 9.8 9.4 8.7  HGB 9.1* 10.4* 10.3* 10.2* 10.1*  HCT 28.5* 31.4* 31.5* 31.3* 31.3*  MCV 92.8 90.5 92.1 92.6 92.9  PLT 173 221 238 270 292    INR: Recent Labs  Lab 04/21/20 0525 04/22/20 0250 04/23/20 0348 04/24/20 0455 04/25/20 0540  INR 1.1 1.0 1.0 1.0 1.0    Other results:  EKG:    Imaging   [name] EKG SITE RITE  Result Date: 04/24/2020 If Site Rite image not attached, placement could not be confirmed due to current cardiac rhythm.    Medications:     Scheduled Medications: . amLODipine  5 mg Oral Daily  . aspirin  81 mg Oral Daily  . gabapentin  600 mg Oral QID  . hydrALAZINE  50 mg Oral Q8H  . insulin aspart  0-15 Units Subcutaneous TID WC  . insulin aspart  0-5 Units Subcutaneous QHS  . mupirocin cream   Topical BID  . pantoprazole  40 mg Oral Daily  . sacubitril-valsartan  1 tablet Oral BID  .  spironolactone  25 mg Oral Daily  . thiamine  100 mg Oral Daily  . varenicline  1 mg Oral Daily  . vitamin B-12  1,000 mcg Oral Daily  . zinc sulfate  220 mg Oral BID    Infusions: .  ceFAZolin (ANCEF) IV 2 g (04/25/20 0542)  . dextrose 5 % and 0.45% NaCl 50 mL/hr at 04/25/20 0309  . heparin 900 Units/hr (04/24/20 1802)  . norepinephrine (LEVOPHED) Adult infusion Stopped (04/19/20 1110)    PRN Medications: acetaminophen, bisacodyl, fentaNYL (SUBLIMAZE) injection, fluticasone, hydrALAZINE, Ipratropium-Albuterol, ondansetron (ZOFRAN) IV, oxyCODONE, sodium chloride flush, zolpidem   Assessment/Plan:    1. 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 He previously required debridement of lower sternalwound with woundVAC therapy and IV antibiotics.This was completed with 3 surgical procedures including debridement and ACell application in March 4008.  More recently, the lower sternal wound reopened with drainage again culturing MSSA and he had been on cephalexin + doxycycline.  The day of admission, he developed persistent bleeding from the wound site and came to ER, bleeding now stopped with suture.  Bleeding again 5/30, now stopped again. Got an additional 1 units PRBCs on 5/31 and again 6/1. Overall he has received 3 units PRBCs + 5 units FFP.  ID consulted and switched antibiotics to cefazolin.  1/2 blood cultures + for MSSA. CT chest/abd/pelvis--> Stable small amount of soft tissue tracking along the proximal drive line in the subcutaneous ventral left abdominal wall.  S/p I&D + wound vac placement on 6/1.   Back to OR for debridement and wound vac change on 6/4.   - Continue cefazolin IV  . PICC placed 04/25/20  - Plan for Thursday am muscle flap closure with plastic surgery.   - Coumadin on hold for procedures. Continue IV heparin.  2. COVID-19 infection: Asymptomatic. No cough/dyspnea.  Repeat COVID-19 test negative 6/5. - He is off  isolation.    3. 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 LVAD parameters stable.  Has been NYHA class II.  MAPs low initially, started on phenylephrine (may have been at least partly vagal as very diaphoretic with bleeding).  Now off phenylephrine and NE.  - Volume status improved. No diuretics  today.  Renal function stable.  - Maps stable.  - Continue spironolactone 25 mg daily.  - Continue Entresto to home 97/103 bid dosing.  - Continue amlodipine 5 mg daily would not increase with leg edema.  - can increase hydralazine to 75 mg tid with MAP still in 90s.    - He is on heparin gtt with need for surgery Thursday.  - Continue ASA 81.  - Continue  5700 rpm.  4. Smoking:He has cut back a lot, still taking Chantix. Encouraged to quit. 5. H/o RLE DVT: On warfarin for LVAD but held for wound exploration 6. OSA: Continue CPAP.  7. Hyperlipidemia: Atorvastatin.  8. Type II diabetes:SSI 9. Anemia, Acute blood loss: Hgb has stabilized.    I reviewed the LVAD parameters from today, and compared the results to the patient's prior recorded data.  No programming changes were made.  The LVAD is functioning within specified parameters.  The patient performs LVAD self-test daily.  LVAD interrogation was negative for any significant power changes, alarms or PI events/speed drops.  LVAD equipment check completed and is in good working order.  Back-up equipment present.   LVAD education done on emergency procedures and precautions and reviewed exit site care.  Length of Stay: 9  Amy Clegg NP-C  04/25/2020 8:19 AM  VAD Team --- VAD ISSUES ONLY--- Pager [phone] (7am - 7am)  Advanced Heart Failure Team  Pager [phone] (M-F; 7a - 4p)  Please contact [facility] for night-coverage after hours (4p -7a ) and weekends on [email or web address]  Patient seen with NP, agree with the above note.   IJ removed yesterday and PICC placed for cefazolin infusion at home.   Stable, MAP 90s, good diuresis yesterday.   General: Well appearing this am. NAD.  HEENT: Normal. Neck: Supple, JVP 7-8 cm. Carotids OK.  Cardiac:  Mechanical heart sounds with LVAD hum present.  Lungs:  CTAB, normal effort.  Abdomen:  NT, ND, no HSM. No bruits or masses. +BS  LVAD exit site: Well-healed and incorporated. Dressing dry and intact. No erythema or drainage. Stabilization device present and accurately applied. Driveline dressing changed daily per sterile technique. Extremities:  Warm and dry. No cyanosis, clubbing, rash, or edema.  Neuro:  Alert & oriented x 3. Cranial nerves grossly intact. Moves all 4 extremities w/o difficulty. Affect pleasant    Today, I will increase hydralazine to 75 mg tid with MAP still elevated.  Does not need Lasix.   Plan for muscle flap wound coverage on Thursday .   Remains on heparin gtt, warfarin on hold.   Marca Ancona 04/25/2020 8:57 AM

## 2020-04-25 NOTE — Progress Notes (Signed)
ANTICOAGULATION CONSULT NOTE   Pharmacy Consult for heparin - per TCTS dosing Indication: LVAD  Allergies  Allergen Reactions   Chlorhexidine Rash   Other Rash    Prep pads    Patient Measurements: Height: 5\' 11"  (180.3 cm) Weight: 91 kg (200 lb 9.9 oz) IBW/kg (Calculated) : 75.3 Heparin Dosing Weight: ~ 90 kg  Vital Signs: Temp: 98.8 F (37.1 C) (06/08 1208) Temp Source: Oral (06/08 1208) BP: 123/81 (06/08 1208) Pulse Rate: 90 (06/08 1208)  Labs: Recent Labs    04/23/20 0348 04/23/20 0348 04/23/20 2122 04/24/20 0455 04/25/20 0540  HGB 10.3*   < >  --  10.2* 10.1*  HCT 31.5*  --   --  31.3* 31.3*  PLT 238  --   --  270 292  LABPROT 12.5  --   --  13.0 12.4  INR 1.0  --   --  1.0 1.0  HEPARINUNFRC <0.10*   < > <0.10* <0.10* <0.10*  CREATININE 1.00  --   --  0.84 0.72   < > = values in this interval not displayed.    Estimated Creatinine Clearance: 111.9 mL/min (by C-G formula based on SCr of 0.72 mg/dL).   Medical History: Past Medical History:  Diagnosis Date   Abscess 01/2020   sternal abscess   Cardiomyopathy, unspecified (HCC)    CHF (congestive heart failure) (HCC)    Chronic back pain    Diabetes mellitus without complication (HCC)    Enlarged heart    Gastric ulcer    Gastroenteritis    H/O degenerative disc disease    Hypertension    LVAD (left ventricular assist device) present (HCC)     Medications:   Infusions:    ceFAZolin (ANCEF) IV 2 g (04/25/20 1356)   dextrose 5 % and 0.45% NaCl 50 mL/hr at 04/25/20 0309   heparin 900 Units/hr (04/24/20 1802)   norepinephrine (LEVOPHED) Adult infusion Stopped (04/19/20 1110)    Assessment: 59 yo male with LVAD - HM3 + ASA 81mg  daily, MSSA bacteremia and  driveline infection.  Chronic Coumadin on hold for procedures - plan flap 6/10.  Heparin started 6/3 at low dose.  Currently heparin level remains undetectable on 900 units/hr. - No titrations per MD for now h/h and pltc remain  low stable  No overt bleeding or complications noted.  Goal of Therapy:  Monitor platelets by anticoagulation protocol: Yes   Plan:  Continue IV Heparin  900 units/hr per discussion with Dr. 77 No plans to titrate. Daily heparin level and CBC. Follow up post procedure completion to restart PTA warfarin  Pharm.D. CPP, BCPS Clinical Pharmacist [phone] 04/25/2020 3:16 PM     [facility] pharmacy phone numbers are listed on [email or web address]

## 2020-04-25 NOTE — Progress Notes (Signed)
Peripherally Inserted Central Catheter Placement  The IV Nurse has discussed with the patient and/or persons authorized to consent for the patient, the purpose of this procedure and the potential benefits and risks involved with this procedure.  The benefits include less needle sticks, lab draws from the catheter, and the patient may be discharged home with the catheter. Risks include, but not limited to, infection, bleeding, blood clot (thrombus formation), and puncture of an artery; nerve damage and irregular heartbeat and possibility to perform a PICC exchange if needed/ordered by physician.  Alternatives to this procedure were also discussed.  Bard Power PICC patient education guide, fact sheet on infection prevention and patient information card has been provided to patient /or left at bedside.    PICC Placement Documentation  PICC Triple Lumen 04/25/20 PICC Right Brachial 41 cm 0 cm (Active)  Indication for Insertion or Continuance of Line Prolonged intravenous therapies 04/25/20 0141  Exposed Catheter (cm) 0 cm 04/25/20 0141  Site Assessment Clean;Dry;Intact 04/25/20 0141  Lumen #1 Status Blood return noted;Flushed;Saline locked 04/25/20 0141  Lumen #2 Status Blood return noted;Flushed;Saline locked 04/25/20 0141  Lumen #3 Status Blood return noted;Flushed;Saline locked 04/25/20 0141  Dressing Type Transparent;Occlusive;Securing device 04/25/20 0141  Dressing Status Clean;Dry;Intact 04/25/20 0141  Line Adjustment (NICU/IV Team Only) No 04/25/20 0141  Dressing Intervention New dressing 04/25/20 0141  Dressing Change Due 05/02/20 04/25/20 0141       Christeen Douglas 04/25/2020, 2:01 AM

## 2020-04-25 NOTE — TOC Initial Note (Signed)
Transition of Care [facility]) - Initial/Assessment Note    Patient Details  Name: Theodore Welch MRN: [id number] Date of Birth: Jul 17, 1961  [facility] CM/SW Contact:    Zenon Mayo, RN Phone Number: 04/25/2020, 4:49 PM  Clinical Narrative:                 Patient transferred from [facility], from home, has a friend that stays with him.  He is indep at home. He uses the [facility] on [address]. His PCP is at the [facility].  He is a LVAD patient, he has a wound vac on right now but the plan is for flap closure on Thursday.  TOC team will continue to follow for dc needs.  Expected Discharge Plan: Home/Self Care Barriers to Discharge: Continued Medical Work up   Patient Goals and CMS Choice Patient states their goals for this hospitalization and ongoing recovery are:: get better   Choice offered to / list presented to : NA  Expected Discharge Plan and Services Expected Discharge Plan: Home/Self Care   Discharge Planning Services: CM Consult   Living arrangements for the past 2 months: Single Family Home                                      Prior Living Arrangements/Services Living arrangements for the past 2 months: Single Family Home Lives with:: Friends Patient language and need for interpreter reviewed:: Yes Do you feel safe going back to the place where you live?: Yes      Need for Family Participation in Patient Care: Yes (Comment) Care giver support system in place?: Yes (comment)   Criminal Activity/Legal Involvement Pertinent to Current Situation/Hospitalization: No - Comment as needed  Activities of Daily Living Home Assistive Devices/Equipment: Walker (specify type), Other (Comment)(vad equipment) ADL Screening (condition at time of admission) Patient's cognitive ability adequate to safely complete daily activities?: Yes Is the patient deaf or have difficulty hearing?: No Does the patient have difficulty seeing, even when  wearing glasses/contacts?: No Does the patient have difficulty concentrating, remembering, or making decisions?: No Patient able to express need for assistance with ADLs?: Yes Does the patient have difficulty dressing or bathing?: No Independently performs ADLs?: Yes (appropriate for developmental age) Does the patient have difficulty walking or climbing stairs?: No Weakness of Legs: None Weakness of Arms/Hands: None  Permission Sought/Granted                  Emotional Assessment   Attitude/Demeanor/Rapport: Engaged Affect (typically observed): Appropriate Orientation: : Oriented to Situation, Oriented to  Time, Oriented to Place, Oriented to Self Alcohol / Substance Use: Not Applicable Psych Involvement: No (comment)  Admission diagnosis:  Shock ([place]) [R57.9] LVAD (left ventricular assist device) present ([place]) [Z95.811] Bleeding from wound [T14.8XXA] Infection associated with driveline of left ventricular assist device (LVAD) ([place]) [U54.7XXA] Patient Active Problem List   Diagnosis Date Noted  . Acute on chronic combined systolic and diastolic CHF (congestive heart failure) ([place])   . Disseminated zoster 02/12/2020  . Abscess of sternal region 01/27/2020  . Staph aureus infection 01/27/2020  . Vaccine counseling 08/12/2019  . Infection associated with driveline of left ventricular assist device (LVAD) ([place]) 11/28/2018  . Chronic systolic (congestive) heart failure ([place]) 11/28/2018  . Complication involving left ventricular assist device (LVAD) 11/23/2018  . LVAD (left ventricular assist device) present ([place]) 08/03/2018  . DVT (  deep venous thrombosis) (HCC) 08/02/2018  . Advance care planning   . Ascites   . COPD (chronic obstructive pulmonary disease) (HCC) 07/21/2018  . HTN (hypertension) 07/21/2018  . Tobacco use 07/21/2018  . Congestive heart failure (HCC) 07/21/2018  . CHF (congestive heart failure) (HCC) 07/21/2018  . Type 2 diabetes mellitus (HCC) 07/21/2018  .  Hyperlipidemia 07/21/2018  . Fibromyalgia syndrome 01/27/2017  . Myofascial pain syndrome, cervical 01/27/2017  . Lumbar radiculopathy 01/24/2014  . History of lumbar laminectomy for spinal cord decompression 12/07/2013  . 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   PCP:  [name], MD Pharmacy:   [facility] [address] [place] - [address] [place] [address] Phone: [phone] Fax: [phone]  [facility] [place], [place] - [address] [place] [address] Phone: [phone] Fax: [phone]     Social Determinants of Health (SDOH) Interventions    Readmission Risk Interventions Readmission Risk Prevention Plan 04/25/2020 02/14/2020 04/05/2019  Transportation Screening Complete Complete -  PCP or Specialist Appt within 3-5 Days - - Not Complete  Not Complete comments - - earliest appt given with HF/LVAD team  HRI or Home Care Consult Complete - -  Social Work Consult for Recovery Care Planning/Counseling Complete - -  Palliative Care Screening Not Applicable - -  Medication Review (RN Care Manager) Complete - -  Some recent data might be hidden

## 2020-04-26 ENCOUNTER — Inpatient Hospital Stay (HOSPITAL_COMMUNITY): Payer: Medicare HMO

## 2020-04-26 ENCOUNTER — Inpatient Hospital Stay: Payer: Self-pay

## 2020-04-26 DIAGNOSIS — R609 Edema, unspecified: Secondary | ICD-10-CM

## 2020-04-26 LAB — GLUCOSE, CAPILLARY
Glucose-Capillary: 166 mg/dL — ABNORMAL HIGH (ref 70–99)
Glucose-Capillary: 195 mg/dL — ABNORMAL HIGH (ref 70–99)
Glucose-Capillary: 86 mg/dL (ref 70–99)
Glucose-Capillary: 84 mg/dL (ref 70–99)
Glucose-Capillary: 144 mg/dL — ABNORMAL HIGH (ref 70–99)

## 2020-04-26 LAB — BASIC METABOLIC PANEL
Anion gap: 9 (ref 5–15)
BUN: 7 mg/dL — ABNORMAL LOW (ref 8–23)
CO2: 25 mmol/L (ref 22–32)
Calcium: 9.2 mg/dL (ref 8.9–10.3)
Chloride: 106 mmol/L (ref 98–111)
Creatinine, Ser: 0.84 mg/dL (ref 0.61–1.24)
GFR calc Af Amer: >60 mL/min (ref >60)
GFR calc non Af Amer: >60 mL/min (ref >60)
Glucose, Bld: 169 mg/dL — ABNORMAL HIGH (ref 70–99)
Potassium: 4.1 mmol/L (ref 3.5–5.1)
Sodium: 140 mmol/L (ref 135–145)

## 2020-04-26 LAB — HEPARIN LEVEL (UNFRACTIONATED): Heparin Unfractionated: <0.1 IU/mL — ABNORMAL LOW (ref 0.30–0.70)

## 2020-04-26 LAB — AEROBIC/ANAEROBIC CULTURE W GRAM STAIN (SURGICAL/DEEP WOUND): Culture: NO GROWTH

## 2020-04-26 LAB — BLOOD GAS, ARTERIAL
Acid-Base Excess: 2.9 mmol/L — ABNORMAL HIGH (ref 0.0–2.0)
Bicarbonate: 26.5 mmol/L (ref 20.0–28.0)
Drawn by: 35043
FIO2: 21
O2 Saturation: 81.2 %
Patient temperature: 36.2
pCO2 arterial: 35.8 mmHg (ref 32.0–48.0)
pH, Arterial: 7.478 — ABNORMAL HIGH (ref 7.350–7.450)
pO2, Arterial: 41.2 mmHg — ABNORMAL LOW (ref 83.0–108.0)

## 2020-04-26 LAB — HEPATIC FUNCTION PANEL
ALT: 10 U/L (ref 0–44)
AST: 15 U/L (ref 15–41)
Albumin: 2.9 g/dL — ABNORMAL LOW (ref 3.5–5.0)
Alkaline Phosphatase: 76 U/L (ref 38–126)
Bilirubin, Direct: <0.1 mg/dL (ref 0.0–0.2)
Total Bilirubin: 0.6 mg/dL (ref 0.3–1.2)
Total Protein: 6 g/dL — ABNORMAL LOW (ref 6.5–8.1)

## 2020-04-26 LAB — PROTIME-INR
INR: 1 (ref 0.8–1.2)
Prothrombin Time: 12.9 seconds (ref 11.4–15.2)

## 2020-04-26 LAB — CBC
HCT: 32.2 % — ABNORMAL LOW (ref 39.0–52.0)
Hemoglobin: 10.3 g/dL — ABNORMAL LOW (ref 13.0–17.0)
MCH: 29.4 pg (ref 26.0–34.0)
MCHC: 32 g/dL (ref 30.0–36.0)
MCV: 92 fL (ref 80.0–100.0)
Platelets: 326 10*3/uL (ref 150–400)
RBC: 3.5 MIL/uL — ABNORMAL LOW (ref 4.22–5.81)
RDW: 13.8 % (ref 11.5–15.5)
WBC: 9 10*3/uL (ref 4.0–10.5)
nRBC: 0 % (ref 0.0–0.2)

## 2020-04-26 LAB — LACTATE DEHYDROGENASE: LDH: 159 U/L (ref 98–192)

## 2020-04-26 LAB — URINALYSIS, ROUTINE W REFLEX MICROSCOPIC
Bilirubin Urine: NEGATIVE
Glucose, UA: NEGATIVE mg/dL
Hgb urine dipstick: NEGATIVE
Ketones, ur: NEGATIVE mg/dL
Leukocytes,Ua: NEGATIVE
Nitrite: NEGATIVE
Protein, ur: NEGATIVE mg/dL
Specific Gravity, Urine: 1.003 — ABNORMAL LOW (ref 1.005–1.030)
pH: 6 (ref 5.0–8.0)

## 2020-04-26 LAB — PREPARE RBC (CROSSMATCH)

## 2020-04-26 MED ORDER — SODIUM CHLORIDE 0.9% FLUSH: 10.0000 mL | INTRAVENOUS | Status: DC | PRN

## 2020-04-26 MED ORDER — SODIUM CHLORIDE 0.9% FLUSH
10.0000 mL | Freq: Two times a day (BID) | INTRAVENOUS | Status: DC
  Administered 2020-04-26: 20 mL
  Administered 2020-04-26 – 2020-04-28 (×4): 10 mL
  Administered 2020-04-28: 30 mL
  Administered 2020-04-30 – 2020-05-01 (×2): 10 mL
  Administered 2020-05-02: 30 mL
  Administered 2020-05-02 – 2020-05-03 (×2): 10 mL
  Administered 2020-05-04: 30 mL
  Administered 2020-05-04 – 2020-05-05 (×3): 10 mL

## 2020-04-26 MED ORDER — AMLODIPINE BESYLATE 10 MG PO TABS
10.0000 mg | ORAL_TABLET | Freq: Every day | ORAL | Status: DC
  Administered 2020-04-26: 10 mg via ORAL
  Filled 2020-04-26: qty 1

## 2020-04-26 MED ORDER — CEFAZOLIN SODIUM-DEXTROSE 2-4 GM/100ML-% IV SOLN
2.0000 g | INTRAVENOUS | Status: AC
  Administered 2020-04-27: 2 g via INTRAVENOUS

## 2020-04-26 NOTE — Plan of Care (Signed)

## 2020-04-26 NOTE — Progress Notes (Signed)
Patient called to me his room and showed me that his PICC line was hanging out of his arm. Catheter intact site no complications pressure dressing applied. I notified IV team about the PICC and patient will need a new one today plans is for him to go to surgery tomorrow. Morning labs are now delayed phlebotomy notified to come and draw labs.

## 2020-04-26 NOTE — H&P (View-Only) (Signed)
[facility] ° °Reason for Consult: Sternal Wound °Referring Physician: Dr. Peter Van Trigt, MD ° °Theodore Welch is an 59 y.o. male.  °HPI: Patient is a 59 yo male with hx of NICM, chronic systolic HF with LVAD in place (placed September 2019), cirrhosis, DM, Hx of RLE DVT, HTN. 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 Patient is currently Covid-19 +. ° °Patient most recently presented to the ED on 04/16/20 for evaluation of a uncontrolled bleeding wound from sternum.  °Patient most recently underwent surgical intervention on 04/18/20 and 04/21/20 with CT surgery. He currently has a wound vac in place over sternal wound.  ° °He is feeling good today, he reports he has been ambulating around the unit and has no current complaints. He is hopeful to heal this up quickly and be discharged so he can return home.  ° °Past Medical History:  °Diagnosis Date  °• Abscess 01/2020  ° sternal abscess  °• Cardiomyopathy, unspecified (HCC)   °• CHF (congestive heart failure) (HCC)   °• Chronic back pain   °• Diabetes mellitus without complication (HCC)   °• Enlarged heart   °• Gastric ulcer   °• Gastroenteritis   °• H/O degenerative disc disease   °• Hypertension   °• LVAD (left ventricular assist device) present (HCC)   ° ° °Past Surgical History:  °Procedure Laterality Date  °• APPLICATION OF WOUND VAC N/A 12/03/2018  ° Procedure: APPLICATION OF WOUND VAC;  Surgeon: Van Trigt, Peter, MD;  [facility]: [facility];  Service: Thoracic;  Laterality: N/A;  °• APPLICATION OF WOUND VAC N/A 12/09/2018  ° Procedure: Removal of Wound Vac, Application of ACELL;  Surgeon: Van Trigt, Peter, MD;  Location: [facility];  Service: Thoracic;  Laterality: N/A;  °• APPLICATION OF WOUND VAC N/A 01/26/2020  ° Procedure: APPLICATION OF WOUND VAC;  Surgeon: Van Trigt, Peter, MD;  [facility]: [facility];  Service: Thoracic;  Laterality: N/A;  °• APPLICATION OF WOUND VAC N/A 01/31/2020  °  Procedure: WOUND VAC CHANGE;  Surgeon: Van Trigt, Peter, MD;  [facility]: [facility];  Service: Thoracic;  Laterality: N/A;  °• APPLICATION OF WOUND VAC N/A 04/18/2020  ° Procedure: Application Of Wound Vac;  Surgeon: Van Trigt, Peter, MD;  [facility];  Service: Vascular;  Laterality: N/A;  °• APPLICATION OF WOUND VAC N/A 04/21/2020  ° Procedure: WOUND VAC CHANGE;  Surgeon: Van Trigt, Peter, MD;  [facility];  Service: Thoracic;  Laterality: N/A;  °• INSERTION OF IMPLANTABLE LEFT VENTRICULAR ASSIST DEVICE N/A 08/03/2018  ° Procedure: INSERTION OF IMPLANTABLE LEFT VENTRICULAR ASSIST DEVICE - HM3;  Surgeon: Bartle, Bryan K, MD;  [facility]: [facility];  Service: Open Heart Surgery;  Laterality: N/A;  HM3  °• LUMBAR LAMINECTOMY/DECOMPRESSION MICRODISCECTOMY    °• ORCHIECTOMY    °• RIGHT HEART CATH N/A 07/24/2018  ° Procedure: RIGHT HEART CATH;  Surgeon: McLean, Dalton S, MD;  [facility]: [facility];  Service: Cardiovascular;  Laterality: N/A;  °• STERNAL WOUND DEBRIDEMENT N/A 12/03/2018  ° Procedure: WOUND DEBRIDEMENT WITH A-CELL;  Surgeon: Van Trigt, Peter, MD;  [facility];  Service: Thoracic;  Laterality: N/A;  °• STERNAL WOUND DEBRIDEMENT N/A 01/26/2020  ° Procedure: STERNAL WOUND & driveline IRRIGATION AND DEBRIDEMENT;  Surgeon: Van Trigt, Peter, MD;  [facility];  Service: Thoracic;  Laterality: N/A;  °• STERNAL WOUND DEBRIDEMENT N/A 01/31/2020  ° Procedure: STERNAL WOUND IRRIGATION;  Surgeon: Van Trigt,   Peter, MD;  [facility];  Service: Thoracic;  Laterality: N/A;  MESONIC JET  °• STERNAL WOUND DEBRIDEMENT N/A 02/04/2020  ° Procedure: STERNAL WOUND DEBRIDEMENT WITH WOUND VAC CHANGE;  Surgeon: Van Trigt, Peter, MD;  [facility];  Service: Thoracic;  Laterality: N/A;  °• TEE WITHOUT CARDIOVERSION N/A 08/03/2018  ° Procedure: TRANSESOPHAGEAL ECHOCARDIOGRAM (TEE);  Surgeon: Bartle, Bryan K, MD;  [facility];  Service: Open Heart Surgery;  Laterality: N/A;  °• WOUND DEBRIDEMENT  04/18/2020  ° Procedure:  Debridement Abdominal Wound;  Surgeon: Van Trigt, Peter, MD;  [facility];  Service: Vascular;;  °• WOUND EXPLORATION N/A 04/18/2020  ° Procedure: REPAIR OF LVAD;  Surgeon: Van Trigt, Peter, MD;  [facility];  Service: Vascular;  Laterality: N/A;  ° ° °History reviewed. No pertinent family history. ° °Social History:  reports that he has been smoking cigarettes. He started smoking about 18 years ago. He has never used smokeless tobacco. He reports previous alcohol use. He reports previous drug use. ° °Allergies:  °Allergies  °Allergen Reactions  °• Chlorhexidine Rash  °• Other Rash  °  Prep pads  ° ° °Medications: I have reviewed the patient's current medications. ° °Results for orders placed or performed during the hospital encounter of 04/16/20 (from the past 48 hour(s))  °Glucose, capillary     Status: Abnormal  ° Collection Time: 04/24/20  4:07 PM  °Result Value Ref Range  ° Glucose-Capillary 102 (H) 70 - 99 mg/dL  °  Comment: Glucose reference range applies only to samples taken after fasting for at least 8 hours.  ° Comment 1 Notify RN   ° Comment 2 Document in Chart   °Glucose, capillary     Status: Abnormal  ° Collection Time: 04/24/20  9:05 PM  °Result Value Ref Range  ° Glucose-Capillary 129 (H) 70 - 99 mg/dL  °  Comment: Glucose reference range applies only to samples taken after fasting for at least 8 hours.  °Protime-INR     Status: None  ° Collection Time: 04/25/20  5:40 AM  °Result Value Ref Range  ° Prothrombin Time 12.4 11.4 - 15.2 seconds  ° INR 1.0 0.8 - 1.2  °  Comment: (NOTE) °INR goal varies based on device and disease states. °Performed at [facility], [address] [place], [place] °[address] °  °Lactate dehydrogenase     Status: None  ° Collection Time: 04/25/20  5:40 AM  °Result Value Ref Range  ° LDH 168 98 - 192 U/L  °  Comment: Performed at [facility], [address] [place], [place] [address]  °Basic metabolic panel     Status: Abnormal  ° Collection Time:  04/25/20  5:40 AM  °Result Value Ref Range  ° Sodium 142 135 - 145 mmol/L  ° Potassium 4.0 3.5 - 5.1 mmol/L  ° Chloride 107 98 - 111 mmol/L  ° CO2 27 22 - 32 mmol/L  ° Glucose, Bld 131 (H) 70 - 99 mg/dL  °  Comment: Glucose reference range applies only to samples taken after fasting for at least 8 hours.  ° BUN 5 (L) 8 - 23 mg/dL  ° Creatinine, Ser 0.72 0.61 - 1.24 mg/dL  ° Calcium 9.0 8.9 - 10.3 mg/dL  ° GFR calc non Af Amer >60 >60 mL/min  ° GFR calc Af Amer >60 >60 mL/min  ° Anion gap 8 5 - 15  °  Comment: Performed at [facility],   [address] [place], [place] [address]  °CBC     Status: Abnormal  ° Collection Time: 04/25/20  5:40 AM  °Result Value Ref Range  ° WBC 8.7 4.0 - 10.5 K/uL  ° RBC 3.37 (L) 4.22 - 5.81 MIL/uL  ° Hemoglobin 10.1 (L) 13.0 - 17.0 g/dL  ° HCT 31.3 (L) 39.0 - 52.0 %  ° MCV 92.9 80.0 - 100.0 fL  ° MCH 30.0 26.0 - 34.0 pg  ° MCHC 32.3 30.0 - 36.0 g/dL  ° RDW 13.9 11.5 - 15.5 %  ° Platelets 292 150 - 400 K/uL  ° nRBC 0.0 0.0 - 0.2 %  °  Comment: Performed at [facility], [address] [place], [place] [address]  °Heparin level (unfractionated)     Status: Abnormal  ° Collection Time: 04/25/20  5:40 AM  °Result Value Ref Range  ° Heparin Unfractionated <0.10 (L) 0.30 - 0.70 IU/mL  °  Comment: (NOTE) °If heparin results are below expected values, and patient dosage has  °been confirmed, suggest follow up testing of antithrombin III levels. °Performed at [facility], [address] [place], [place] °[address] °  °Glucose, capillary     Status: Abnormal  ° Collection Time: 04/25/20  5:58 AM  °Result Value Ref Range  ° Glucose-Capillary 142 (H) 70 - 99 mg/dL  °  Comment: Glucose reference range applies only to samples taken after fasting for at least 8 hours.  °Glucose, capillary     Status: Abnormal  ° Collection Time: 04/25/20 10:56 AM  °Result Value Ref Range  ° Glucose-Capillary 125 (H) 70 - 99 mg/dL  °  Comment: Glucose reference range applies only to samples taken  after fasting for at least 8 hours.  °Glucose, capillary     Status: None  ° Collection Time: 04/25/20  4:59 PM  °Result Value Ref Range  ° Glucose-Capillary 97 70 - 99 mg/dL  °  Comment: Glucose reference range applies only to samples taken after fasting for at least 8 hours.  °Glucose, capillary     Status: Abnormal  ° Collection Time: 04/25/20  9:06 PM  °Result Value Ref Range  ° Glucose-Capillary 100 (H) 70 - 99 mg/dL  °  Comment: Glucose reference range applies only to samples taken after fasting for at least 8 hours.  °Blood gas, arterial     Status: Abnormal  ° Collection Time: 04/26/20  5:23 AM  °Result Value Ref Range  ° FIO2 21.00   ° pH, Arterial 7.478 (H) 7.350 - 7.450  ° pCO2 arterial 35.8 32.0 - 48.0 mmHg  ° pO2, Arterial 41.2 (L) 83.0 - 108.0 mmHg  ° Bicarbonate 26.5 20.0 - 28.0 mmol/L  ° Acid-Base Excess 2.9 (H) 0.0 - 2.0 mmol/L  ° O2 Saturation 81.2 %  ° Patient temperature 36.2   ° Collection site LEFT RADIAL   ° Drawn by 35043   °  Comment: COLLECTED BY RT  ° Sample type ARTERIAL DRAW   ° Allens test (pass/fail) PASS PASS  °  Comment: Performed at  Hospital Lab, [address] [place],  [address]  °Glucose, capillary     Status: Abnormal  ° Collection Time: 04/26/20  6:33 AM  °Result Value Ref Range  ° Glucose-Capillary 166 (H) 70 - 99 mg/dL  °  Comment: Glucose reference range applies only to samples taken after fasting for at least 8 hours.  °Protime-INR     Status: None  ° Collection Time:   04/26/20  6:47 AM  °Result Value Ref Range  ° Prothrombin Time 12.9 11.4 - 15.2 seconds  ° INR 1.0 0.8 - 1.2  °  Comment: (NOTE) °INR goal varies based on device and disease states. °Performed at [facility], [address] [place], [place] °[address] °  °Lactate dehydrogenase     Status: None  ° Collection Time: 04/26/20  6:47 AM  °Result Value Ref Range  ° LDH 159 98 - 192 U/L  °  Comment: Performed at [facility], [address] [place], [place] [address]  °Heparin level  (unfractionated)     Status: Abnormal  ° Collection Time: 04/26/20  6:47 AM  °Result Value Ref Range  ° Heparin Unfractionated <0.10 (L) 0.30 - 0.70 IU/mL  °  Comment: (NOTE) °If heparin results are below expected values, and patient dosage has  °been confirmed, suggest follow up testing of antithrombin III levels. °Performed at [facility], [address] [place], [place] °[address] °  °Basic metabolic panel     Status: Abnormal  ° Collection Time: 04/26/20  6:47 AM  °Result Value Ref Range  ° Sodium 140 135 - 145 mmol/L  ° Potassium 4.1 3.5 - 5.1 mmol/L  ° Chloride 106 98 - 111 mmol/L  ° CO2 25 22 - 32 mmol/L  ° Glucose, Bld 169 (H) 70 - 99 mg/dL  °  Comment: Glucose reference range applies only to samples taken after fasting for at least 8 hours.  ° BUN 7 (L) 8 - 23 mg/dL  ° Creatinine, Ser 0.84 0.61 - 1.24 mg/dL  ° Calcium 9.2 8.9 - 10.3 mg/dL  ° GFR calc non Af Amer >60 >60 mL/min  ° GFR calc Af Amer >60 >60 mL/min  ° Anion gap 9 5 - 15  °  Comment: Performed at [facility], [address] [place], [place] [address]  °CBC     Status: Abnormal  ° Collection Time: 04/26/20  6:47 AM  °Result Value Ref Range  ° WBC 9.0 4.0 - 10.5 K/uL  ° RBC 3.50 (L) 4.22 - 5.81 MIL/uL  ° Hemoglobin 10.3 (L) 13.0 - 17.0 g/dL  ° HCT 32.2 (L) 39.0 - 52.0 %  ° MCV 92.0 80.0 - 100.0 fL  ° MCH 29.4 26.0 - 34.0 pg  ° MCHC 32.0 30.0 - 36.0 g/dL  ° RDW 13.8 11.5 - 15.5 %  ° Platelets 326 150 - 400 K/uL  ° nRBC 0.0 0.0 - 0.2 %  °  Comment: Performed at [facility], [address] [place], [place] [address]  °Hepatic function panel     Status: Abnormal  ° Collection Time: 04/26/20  6:47 AM  °Result Value Ref Range  ° Total Protein 6.0 (L) 6.5 - 8.1 g/dL  ° Albumin 2.9 (L) 3.5 - 5.0 g/dL  ° AST 15 15 - 41 U/L  ° ALT 10 0 - 44 U/L  ° Alkaline Phosphatase 76 38 - 126 U/L  ° Total Bilirubin 0.6 0.3 - 1.2 mg/dL  ° Bilirubin, Direct <0.1 0.0 - 0.2 mg/dL  ° Indirect Bilirubin NOT CALCULATED 0.3 - 0.9 mg/dL  °  Comment:  Performed at [facility], [address] [place], [place] [address]  °Glucose, capillary     Status: Abnormal  ° Collection Time: 04/26/20  7:38 AM  °Result Value Ref Range  ° Glucose-Capillary 195 (H) 70 - 99 mg/dL  °  Comment: Glucose reference   range applies only to samples taken after fasting for at least 8 hours.  °Glucose, capillary     Status: None  ° Collection Time: 04/26/20 11:05 AM  °Result Value Ref Range  ° Glucose-Capillary 86 70 - 99 mg/dL  °  Comment: Glucose reference range applies only to samples taken after fasting for at least 8 hours.  ° ° °DG Chest Port 1 View ° °Result Date: 04/26/2020 °CLINICAL DATA:  LVAD EXAM: PORTABLE CHEST 1 VIEW COMPARISON:  Portable exam 0617 hours compared to 04/20/2020 FINDINGS: LVAD projects over cardiac apex. Enlargement of cardiac silhouette post median sternotomy. Mediastinal contours and pulmonary vascularity normal. Lungs clear. No infiltrate, pleural effusion, or pneumothorax. Osseous structures unremarkable. IMPRESSION: Enlargement of cardiac silhouette post LVAD. No acute abnormalities. Electronically Signed   By: Mark  Boles M.D.   On: 04/26/2020 09:06  ° °VAS US LOWER EXTREMITY VENOUS (DVT) ° °Result Date: 04/26/2020 ° Lower Venous DVTStudy Indications: Edema, and Post-op.  Comparison Study: 07/22/18 DVT right pero Performing Technologist: Jill Parker RDMS, RVT  Examination Guidelines: A complete evaluation includes B-mode imaging, spectral Doppler, color Doppler, and power Doppler as needed of all accessible portions of each vessel. Bilateral testing is considered an integral part of a complete examination. Limited examinations for reoccurring indications may be performed as noted. The reflux portion of the exam is performed with the patient in reverse Trendelenburg.  +---------+---------------+---------+-----------+----------+--------------+  RIGHT     Compressibility Phasicity Spontaneity Properties Thrombus Aging   +---------+---------------+---------+-----------+----------+--------------+  CFV       Full            Yes       Yes                                    +---------+---------------+---------+-----------+----------+--------------+  SFJ       Full                                                             +---------+---------------+---------+-----------+----------+--------------+  FV Prox   Full                                                             +---------+---------------+---------+-----------+----------+--------------+  FV Mid    Full                                                             +---------+---------------+---------+-----------+----------+--------------+  FV Distal Full                                                             +---------+---------------+---------+-----------+----------+--------------+  PFV       Full                                                             +---------+---------------+---------+-----------+----------+--------------+    POP      Full           Yes      Yes                                 +---------+---------------+---------+-----------+----------+--------------+ PTV      Full                                                        +---------+---------------+---------+-----------+----------+--------------+ PERO     Full                                                        +---------+---------------+---------+-----------+----------+--------------+   +---------+---------------+---------+-----------+----------+--------------+ LEFT     CompressibilityPhasicitySpontaneityPropertiesThrombus Aging +---------+---------------+---------+-----------+----------+--------------+ CFV      Full           Yes      Yes                                 +---------+---------------+---------+-----------+----------+--------------+ SFJ      Full                                                         +---------+---------------+---------+-----------+----------+--------------+ FV Prox  Full                                                        +---------+---------------+---------+-----------+----------+--------------+ FV Mid   Full                                                        +---------+---------------+---------+-----------+----------+--------------+ FV DistalFull                                                        +---------+---------------+---------+-----------+----------+--------------+ PFV      Full                                                        +---------+---------------+---------+-----------+----------+--------------+ POP      Full           Yes      Yes                                 +---------+---------------+---------+-----------+----------+--------------+  POP       Full            Yes       Yes                                    +---------+---------------+---------+-----------+----------+--------------+  PTV       Full                                                             +---------+---------------+---------+-----------+----------+--------------+  PERO      Full                                                             +---------+---------------+---------+-----------+----------+--------------+     Summary: RIGHT: - There is no evidence of deep vein thrombosis in the lower extremity.  - No cystic structure found in the popliteal fossa.  LEFT: - There is no evidence of deep vein thrombosis in the lower extremity.  - No cystic structure found in the popliteal fossa. - Ultrasound characteristics of enlarged lymph nodes noted in the groin.  *See table(s) above for measurements and observations.    Preliminary   ° °US EKG SITE RITE ° °Result Date: 04/26/2020 °If Site Rite image not attached, placement could not be confirmed due to current cardiac rhythm. ° ° °Review of Systems  °Constitutional: Negative.   °Respiratory: Negative.   °Cardiovascular: Negative.   °Gastrointestinal: Negative.   °Skin: Positive for rash (left antecubital fossa).  °Neurological: Negative for focal weakness.  ° °Blood  pressure (!) 109/91, pulse 85, temperature 98 °F (36.7 °C), temperature source Oral, resp. rate 20, height 5' 11" (1.803 m), weight 91 kg, SpO2 99 %. °Physical Exam  °Constitutional: He is oriented to person, place, and time.  °Elderly BM °  °HENT:  °Head: Normocephalic and atraumatic.  °Eyes: Pupils are equal, round, and reactive to light.  °Cardiovascular:  °LVAD  °Respiratory: Effort normal. He exhibits tenderness (mild). He exhibits no mass, no edema and no swelling.  ° ° °Wound vac in place over midline sternum, 200 cc serosanguinous drainage noted in canister. Good seal noted. No periwound erythema.   °GI: Soft. He exhibits no distension.  °Driveline dressing in place - LLQ  °Neurological: He is alert and oriented to person, place, and time.  °Skin: Skin is warm and dry. No erythema.  °Psychiatric: He has a normal mood and affect. Judgment normal.  ° ° °Assessment/Plan: ° °Sternal wound:  ° °Plan for sternal wound debridement, possible muscle flap to sternum, application of A-cell, application of wound vac tomorrow with Dr. Dillingham and Dr. Van Trigt. ° °NPO after midnight tonight. ° °Optimize nutritional status with MV w/ vit C, protein shakes as able. ° °Discussed plan with patient, all of his questions were answered to his content. He is ready for surgery and in good spirits.  ° °Appreciate the opportunity to participate in his care.  ° °Masen Salvas J Kaida Games, PA-C °04/26/2020, 1:09 PM  ° ° ° ° °

## 2020-04-26 NOTE — Progress Notes (Signed)
Peripherally Inserted Central Catheter Placement  The IV Nurse has discussed with the patient and/or persons authorized to consent for the patient, the purpose of this procedure and the potential benefits and risks involved with this procedure.  The benefits include less needle sticks, lab draws from the catheter, and the patient may be discharged home with the catheter. Risks include, but not limited to, infection, bleeding, blood clot (thrombus formation), and puncture of an artery; nerve damage and irregular heartbeat and possibility to perform a PICC exchange if needed/ordered by physician.  Alternatives to this procedure were also discussed.  Bard Power PICC patient education guide, fact sheet on infection prevention and patient information card has been provided to patient /or left at bedside.    PICC Placement Documentation  PICC Triple Lumen 04/26/20 PICC Right Brachial 38 cm 0 cm (Active)  Exposed Catheter (cm) 0 cm 04/26/20 1519  Site Assessment Clean;Dry;Intact 04/26/20 1519  Lumen #1 Status Flushed;Saline locked;Blood return noted 04/26/20 1519  Lumen #2 Status Flushed;Saline locked;Blood return noted 04/26/20 1519  Lumen #3 Status Flushed;Saline locked;Blood return noted 04/26/20 1519  Dressing Type Transparent;Securing device 04/26/20 1519  Dressing Status Clean;Dry;Intact 04/26/20 1519  Dressing Change Due 05/03/20 04/26/20 1519       Romie Jumper 04/26/2020, 3:31 PM

## 2020-04-26 NOTE — Progress Notes (Signed)
5 Days Post-Op Procedure(s) (LRB): WOUND VAC CHANGE (N/A) Subjective:  No more chest pain or shortness of breath Chest x-ray today is clear Laboratory values satisfactory Plan muscle flap placement in OR tomorrow with Dr. Everett Graff right rectus flap to cover hardware. Objective: Vital signs in last 24 hours: Temp:  [97.2 F (36.2 C)-99.1 F (37.3 C)] 98 F (36.7 C) (06/09 1115) Pulse Rate:  [80-91] 85 (06/09 1115) Cardiac Rhythm: Normal sinus rhythm (06/09 1115) Resp:  [16-21] 20 (06/09 1115) BP: (99-119)/(59-93) 109/91 (06/09 1115) SpO2:  [96 %-100 %] 99 % (06/09 1115) Weight:  [91 kg] 91 kg (06/09 1106)  Hemodynamic parameters for last 24 hours: CVP:  [7 mmHg-8 mmHg] 7 mmHg  Intake/Output from previous day: 06/08 0701 - 06/09 0700 In: 808 [P.O.:240; I.V.:278.4; IV Piggyback:289.6] Out: 2900 [Urine:2900] Intake/Output this shift: Total I/O In: 54.5 [I.V.:54.5] Out: -        Exam    General- alert and comfortable    Neck- no JVD, no cervical adenopathy palpable, no carotid bruit   Lungs- clear without rales, wheezes   Cor- regular rate and rhythm, normal VAD hum without murmur , gallop   Abdomen- soft, non-tender   Extremities - warm, non-tender, minimal edema   Neuro- oriented, appropriate, no focal weakness   Lab Results: Recent Labs    04/25/20 0540 04/26/20 0647  WBC 8.7 9.0  HGB 10.1* 10.3*  HCT 31.3* 32.2*  PLT 292 326   BMET:  Recent Labs    04/25/20 0540 04/26/20 0647  NA 142 140  K 4.0 4.1  CL 107 106  CO2 27 25  GLUCOSE 131* 169*  BUN 5* 7*  CREATININE 0.72 0.84  CALCIUM 9.0 9.2    PT/INR:  Recent Labs    04/26/20 0647  LABPROT 12.9  INR 1.0   ABG    Component Value Date/Time   PHART 7.478 (H) 04/26/2020 0523   HCO3 26.5 04/26/2020 0523   TCO2 27 04/19/2020 0832   ACIDBASEDEF 1.0 04/18/2020 1513   O2SAT 81.2 04/26/2020 0523   CBG (last 3)  Recent Labs    04/26/20 0633 04/26/20 0738 04/26/20 1105  GLUCAP  166* 195* 86    Assessment/Plan: S/P Procedure(s) (LRB): WOUND VAC CHANGE (N/A) Muscle flap coverage of LVAD hardware exposed in power cord tunnel in a.m.  DC heparin on-call the OR. Procedure discussed in detail with patient and wife including benefits and risks.  LOS: 10 days    [name] 04/26/2020

## 2020-04-26 NOTE — Progress Notes (Signed)
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   HM III LVAD implanted on 08/03/18 by Dr. Laneta Simmers under Destination Therapy criteria due to smoking status.  Pt sitting up in recliner this morning. Shared with me that his PICC line was accidentally pulled out this morning. He voices frustration regarding needing it replaced. Plan for OR tomorrow for muscle flap with Dr Ulice Bold and Dr Donata Clay.    Vital signs: Temp: 97.9 HR: 85 Doppler Pressure: 98 Auto cuff: 119/81 (94) O2 Sat: 98% on RA Wt: 197>189>189>203.7>208.7>207.6>200.8>200.6>200.6 lbs   LVAD interrogation reveals:  Speed: 5500 Flow: 4.0 Power: 4.5w PI: 5.8  Alarms: none Events: 21 PI events overnight (was diuresed yesterday) Hematocrit: 32  Fixed speed: 5500 Low speed limit: 5200  Drive Line: Dressing change per bedside RN today. Next dressing change due: 04/27/20.    Labs:  LDH trend: 182>194>140>252>191>162>160>168>159  INR trend: 2.5>1.9>1.5>1.4>1.3>1.2>1.1>1.0>1.0>1.0  Anticoagulation Plan: -INR Goal: 2.0 - 2.5 - warfarin on hold -ASA Dose: 81 mg daily - on hold  Blood Products:  - 04/16/20>FFP x 2; PRBC x 1 - 04/17/20>>FFP x 1; PRBC x 1  Intra-op: - 04/18/20>>PRBC x 6; Plts x 2; FFP x 2; Cell saver 2330 ml  Device: N/A  Respiratory: intubated intra op 04/18/20 - extubated 04/19/20 am   Infection:  - 04/16/20>>Covid positive - 04/16/20>>BCs staph aureus - 04/18/20>>sternal wound culture intra-op>>staph aureus - 04/20/20>>BCs pending - 04/22/20>> COVID negative  Gtts: - Heparin 900 units/hr   Plan/Recommendations:  1. Call VAD Coordinator if any VAD equipment or drive line issues. 2. Daily drive line dressing change per bedside RN 3. VAD coordinator to accompany patient to OR tomorrow morning   Alyce Pagan RN VAD Coordinator  Office: [phone]  24/7 Pager: [phone]

## 2020-04-26 NOTE — Progress Notes (Signed)
Pharmacy Antibiotic Note  Theodore Welch is a 59 y.o. male admitted on 04/16/2020 LVAD sternal wound infection. Pt has hx of recurrent MSSA infection and is now s/p 8 weeks cefazolin followed by cephalexin.  S/p exploration in OR 6/1 with plans to go back to OR 6/4.   Afebrile, WBC 9, Scr 0.85.  Plan is for extended course of ancef per ID.   5/30 Bcx 1/2 MSSA  6/1 sternum: no orgs seen  Plan: Continue cefazolin 2g IV q8h for now Add on rifabutin 300 mg qd Monitor cx results, clinical pic, and cost on rifabutin  Height: 5\' 11"  (180.3 cm) Weight: 91 kg (200 lb 9.9 oz)(scale B ) IBW/kg (Calculated) : 75.3  Temp (24hrs), Avg:98.4 F (36.9 C), Min:97.2 F (36.2 C), Max:99.1 F (37.3 C)  Recent Labs  Lab 04/22/20 1303 04/23/20 0348 04/24/20 0455 04/25/20 0540 04/26/20 0647  WBC 14.3* 9.8 9.4 8.7 9.0  CREATININE 0.76 1.00 0.84 0.72 0.84    Estimated Creatinine Clearance: 106.6 mL/min (by C-G formula based on SCr of 0.84 mg/dL).    Allergies  Allergen Reactions  . Chlorhexidine Rash  . Other Rash    Prep pads    Thank you for allowing pharmacy to be a part of this patient's care.  06/26/20, PharmD, BCCCP Clinical Pharmacist  Phone: [phone] 04/26/2020 12:36 PM  Please check AMION for all [facility] phone numbers After 10:00 PM, call Main Pharmacy [phone]

## 2020-04-26 NOTE — Plan of Care (Signed)
  Problem: Education: Goal: Knowledge of General Education information will improve Description: Including pain rating scale, medication(s)/side effects and non-pharmacologic comfort measures 04/26/2020 1014 by Alesia Morin, RN Outcome: Progressing 04/26/2020 0950 by Alesia Morin, RN Outcome: Progressing   Problem: Health Behavior/Discharge Planning: Goal: Ability to manage health-related needs will improve 04/26/2020 1014 by Alesia Morin, RN Outcome: Progressing 04/26/2020 0950 by Alesia Morin, RN Outcome: Progressing   Problem: Clinical Measurements: Goal: Ability to maintain clinical measurements within normal limits will improve 04/26/2020 1014 by Alesia Morin, RN Outcome: Progressing 04/26/2020 0950 by Alesia Morin, RN Outcome: Progressing Goal: Will remain free from infection 04/26/2020 1014 by Alesia Morin, RN Outcome: Progressing 04/26/2020 0950 by Alesia Morin, RN Outcome: Progressing Goal: Diagnostic test results will improve 04/26/2020 1014 by Alesia Morin, RN Outcome: Progressing 04/26/2020 0950 by Alesia Morin, RN Outcome: Progressing Goal: Respiratory complications will improve 04/26/2020 1014 by Alesia Morin, RN Outcome: Progressing 04/26/2020 0950 by Alesia Morin, RN Outcome: Progressing Goal: Cardiovascular complication will be avoided 04/26/2020 1014 by Alesia Morin, RN Outcome: Progressing 04/26/2020 0950 by Alesia Morin, RN Outcome: Progressing   Problem: Activity: Goal: Risk for activity intolerance will decrease 04/26/2020 1014 by Alesia Morin, RN Outcome: Progressing 04/26/2020 0950 by Alesia Morin, RN Outcome: Progressing   Problem: Nutrition: Goal: Adequate nutrition will be maintained 04/26/2020 1014 by Alesia Morin, RN Outcome: Progressing 04/26/2020 0950 by Alesia Morin, RN Outcome: Progressing   Problem: Coping: Goal: Level of anxiety will decrease 04/26/2020  1014 by Alesia Morin, RN Outcome: Progressing 04/26/2020 0950 by Alesia Morin, RN Outcome: Progressing   Problem: Elimination: Goal: Will not experience complications related to urinary retention 04/26/2020 1014 by Alesia Morin, RN Outcome: Progressing 04/26/2020 0950 by Alesia Morin, RN Outcome: Progressing   Problem: Pain Managment: Goal: General experience of comfort will improve 04/26/2020 1014 by Alesia Morin, RN Outcome: Progressing 04/26/2020 0950 by Alesia Morin, RN Outcome: Progressing   Problem: Safety: Goal: Ability to remain free from injury will improve 04/26/2020 1014 by Alesia Morin, RN Outcome: Progressing 04/26/2020 0950 by Alesia Morin, RN Outcome: Progressing   Problem: Skin Integrity: Goal: Risk for impaired skin integrity will decrease 04/26/2020 1014 by Alesia Morin, RN Outcome: Progressing 04/26/2020 0950 by Alesia Morin, RN Outcome: Progressing

## 2020-04-26 NOTE — Consult Note (Signed)
[facility]  Reason for Consult: Sternal Wound Referring Physician: Dr. Ivin Poot, MD  Theodore Welch is an 59 y.o. male.  HPI: Patient is a 59 yo male with hx of NICM, chronic systolic HF with LVAD in place (placed September 2019), cirrhosis, DM, Hx of RLE DVT, HTN. 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 Patient is currently Covid-19 +.  Patient most recently presented to the ED on 04/16/20 for evaluation of a uncontrolled bleeding wound from sternum.  Patient most recently underwent surgical intervention on 04/18/20 and 04/21/20 with CT surgery. He currently has a wound vac in place over sternal wound.   He is feeling good today, he reports he has been ambulating around the unit and has no current complaints. He is hopeful to heal this up quickly and be discharged so he can return home.   Past Medical History:  Diagnosis Date   Abscess 01/2020   sternal abscess   Cardiomyopathy, unspecified ([place])    CHF (congestive heart failure) (HCC)    Chronic back pain    Diabetes mellitus without complication ([place])    Enlarged heart    Gastric ulcer    Gastroenteritis    H/O degenerative disc disease    Hypertension    LVAD (left ventricular assist device) present [facility])     Past Surgical History:  Procedure Laterality Date   APPLICATION OF WOUND VAC N/A 12/03/2018   Procedure: APPLICATION OF WOUND VAC;  Surgeon: Ivin Poot, MD;  Location: [place];  Service: Thoracic;  Laterality: N/A;   APPLICATION OF WOUND VAC N/A 12/09/2018   Procedure: Removal of Wound Vac, Application of ACELL;  Surgeon: Ivin Poot, MD;  Location: [place];  Service: Thoracic;  Laterality: N/A;   APPLICATION OF WOUND VAC N/A 01/26/2020   Procedure: APPLICATION OF WOUND VAC;  Surgeon: Ivin Poot, MD;  Location: [place];  Service: Thoracic;  Laterality: N/A;   APPLICATION OF WOUND VAC N/A 01/31/2020    Procedure: WOUND VAC CHANGE;  Surgeon: Ivin Poot, MD;  Location: [place];  Service: Thoracic;  Laterality: N/A;   APPLICATION OF WOUND VAC N/A 04/18/2020   Procedure: Application Of Wound Vac;  Surgeon: Ivin Poot, MD;  Location: [place];  Service: Vascular;  Laterality: N/A;   APPLICATION OF WOUND VAC N/A 04/21/2020   Procedure: WOUND VAC CHANGE;  Surgeon: Ivin Poot, MD;  Location: [place];  Service: Thoracic;  Laterality: N/A;   INSERTION OF IMPLANTABLE LEFT VENTRICULAR ASSIST DEVICE N/A 08/03/2018   Procedure: INSERTION OF IMPLANTABLE LEFT VENTRICULAR ASSIST DEVICE - HM3;  Surgeon: Gaye Pollack, MD;  Location: [place];  Service: Open Heart Surgery;  Laterality: N/A;  HM3   LUMBAR LAMINECTOMY/DECOMPRESSION MICRODISCECTOMY     ORCHIECTOMY     RIGHT HEART CATH N/A 07/24/2018   Procedure: RIGHT HEART CATH;  Surgeon: Larey Dresser, MD;  Location: [place] [facility];  Service: Cardiovascular;  Laterality: N/A;   STERNAL WOUND DEBRIDEMENT N/A 12/03/2018   Procedure: WOUND DEBRIDEMENT WITH A-CELL;  Surgeon: Ivin Poot, MD;  Location: [place];  Service: Thoracic;  Laterality: N/A;   STERNAL WOUND DEBRIDEMENT N/A 01/26/2020   Procedure: STERNAL WOUND & driveline IRRIGATION AND DEBRIDEMENT;  Surgeon: Ivin Poot, MD;  Location: [facility] OR;  Service: Thoracic;  Laterality: N/A;   STERNAL WOUND DEBRIDEMENT N/A 01/31/2020   Procedure: STERNAL WOUND IRRIGATION;  Surgeon: Prescott Gum,  Theron Arista, MD;  Location: [facility] OR;  Service: Thoracic;  Laterality: N/A;  MESONIC JET   STERNAL WOUND DEBRIDEMENT N/A 02/04/2020   Procedure: STERNAL WOUND DEBRIDEMENT WITH WOUND VAC CHANGE;  Surgeon: Kerin Perna, MD;  Location: [facility] OR;  Service: Thoracic;  Laterality: N/A;   TEE WITHOUT CARDIOVERSION N/A 08/03/2018   Procedure: TRANSESOPHAGEAL ECHOCARDIOGRAM (TEE);  Surgeon: Alleen Borne, MD;  Location: [facility] OR;  Service: Open Heart Surgery;  Laterality: N/A;   WOUND DEBRIDEMENT  04/18/2020   Procedure:  Debridement Abdominal Wound;  Surgeon: Kerin Perna, MD;  Location: [facility] OR;  Service: Vascular;;   WOUND EXPLORATION N/A 04/18/2020   Procedure: REPAIR OF LVAD;  Surgeon: Kerin Perna, MD;  Location: [facility] OR;  Service: Vascular;  Laterality: N/A;    History reviewed. No pertinent family history.  Social History:  reports that he has been smoking cigarettes. He started smoking about 18 years ago. He has never used smokeless tobacco. He reports previous alcohol use. He reports previous drug use.  Allergies:  Allergies  Allergen Reactions   Chlorhexidine Rash   Other Rash    Prep pads    Medications: I have reviewed the patient's current medications.  Results for orders placed or performed during the hospital encounter of 04/16/20 (from the past 48 hour(s))  Glucose, capillary     Status: Abnormal   Collection Time: 04/24/20  4:07 PM  Result Value Ref Range   Glucose-Capillary 102 (H) 70 - 99 mg/dL    Comment: Glucose reference range applies only to samples taken after fasting for at least 8 hours.   Comment 1 Notify RN    Comment 2 Document in Chart   Glucose, capillary     Status: Abnormal   Collection Time: 04/24/20  9:05 PM  Result Value Ref Range   Glucose-Capillary 129 (H) 70 - 99 mg/dL    Comment: Glucose reference range applies only to samples taken after fasting for at least 8 hours.  Protime-INR     Status: None   Collection Time: 04/25/20  5:40 AM  Result Value Ref Range   Prothrombin Time 12.4 11.4 - 15.2 seconds   INR 1.0 0.8 - 1.2    Comment: (NOTE) INR goal varies based on device and disease states. Performed at [facility], [address] [place], [place] [address]   Lactate dehydrogenase     Status: None   Collection Time: 04/25/20  5:40 AM  Result Value Ref Range   LDH 168 98 - 192 U/L    Comment: Performed at [facility], [address], [place], [place] [address]  Basic metabolic panel     Status: Abnormal   Collection Time:  04/25/20  5:40 AM  Result Value Ref Range   Sodium 142 135 - 145 mmol/L   Potassium 4.0 3.5 - 5.1 mmol/L   Chloride 107 98 - 111 mmol/L   CO2 27 22 - 32 mmol/L   Glucose, Bld 131 (H) 70 - 99 mg/dL    Comment: Glucose reference range applies only to samples taken after fasting for at least 8 hours.   BUN 5 (L) 8 - 23 mg/dL   Creatinine, Ser 8.65 0.61 - 1.24 mg/dL   Calcium 9.0 8.9 - 78.4 mg/dL   GFR calc non Af Amer >60 >60 mL/min   GFR calc Af Amer >60 >60 mL/min   Anion gap 8 5 - 15    Comment: Performed at [facility],  [address] [place], [place] [address]  CBC     Status: Abnormal   Collection Time: 04/25/20  5:40 AM  Result Value Ref Range   WBC 8.7 4.0 - 10.5 K/uL   RBC 3.37 (L) 4.22 - 5.81 MIL/uL   Hemoglobin 10.1 (L) 13.0 - 17.0 g/dL   HCT 50.5 (L) 39.7 - 67.3 %   MCV 92.9 80.0 - 100.0 fL   MCH 30.0 26.0 - 34.0 pg   MCHC 32.3 30.0 - 36.0 g/dL   RDW 41.9 37.9 - 02.4 %   Platelets 292 150 - 400 K/uL   nRBC 0.0 0.0 - 0.2 %    Comment: Performed at [facility], [address], [place], [place] [address]  Heparin level (unfractionated)     Status: Abnormal   Collection Time: 04/25/20  5:40 AM  Result Value Ref Range   Heparin Unfractionated <0.10 (L) 0.30 - 0.70 IU/mL    Comment: (NOTE) If heparin results are below expected values, and patient dosage has  been confirmed, suggest follow up testing of antithrombin III levels. Performed at [facility], [address], [place], [place] [address]   Glucose, capillary     Status: Abnormal   Collection Time: 04/25/20  5:58 AM  Result Value Ref Range   Glucose-Capillary 142 (H) 70 - 99 mg/dL    Comment: Glucose reference range applies only to samples taken after fasting for at least 8 hours.  Glucose, capillary     Status: Abnormal   Collection Time: 04/25/20 10:56 AM  Result Value Ref Range   Glucose-Capillary 125 (H) 70 - 99 mg/dL    Comment: Glucose reference range applies only to samples taken  after fasting for at least 8 hours.  Glucose, capillary     Status: None   Collection Time: 04/25/20  4:59 PM  Result Value Ref Range   Glucose-Capillary 97 70 - 99 mg/dL    Comment: Glucose reference range applies only to samples taken after fasting for at least 8 hours.  Glucose, capillary     Status: Abnormal   Collection Time: 04/25/20  9:06 PM  Result Value Ref Range   Glucose-Capillary 100 (H) 70 - 99 mg/dL    Comment: Glucose reference range applies only to samples taken after fasting for at least 8 hours.  Blood gas, arterial     Status: Abnormal   Collection Time: 04/26/20  5:23 AM  Result Value Ref Range   FIO2 21.00    pH, Arterial 7.478 (H) 7.350 - 7.450   pCO2 arterial 35.8 32.0 - 48.0 mmHg   pO2, Arterial 41.2 (L) 83.0 - 108.0 mmHg   Bicarbonate 26.5 20.0 - 28.0 mmol/L   Acid-Base Excess 2.9 (H) 0.0 - 2.0 mmol/L   O2 Saturation 81.2 %   Patient temperature 36.2    Collection site LEFT RADIAL    Drawn by [id number]     Comment: COLLECTED BY RT   Sample type ARTERIAL DRAW    Allens test (pass/fail) PASS PASS    Comment: Performed at [facility], [address] [place], [place] [address]  Glucose, capillary     Status: Abnormal   Collection Time: 04/26/20  6:33 AM  Result Value Ref Range   Glucose-Capillary 166 (H) 70 - 99 mg/dL    Comment: Glucose reference range applies only to samples taken after fasting for at least 8 hours.  Protime-INR     Status: None   Collection Time:  04/26/20  6:47 AM  Result Value Ref Range   Prothrombin Time 12.9 11.4 - 15.2 seconds   INR 1.0 0.8 - 1.2    Comment: (NOTE) INR goal varies based on device and disease states. Performed at [facility], [address] [place], [place] [address]   Lactate dehydrogenase     Status: None   Collection Time: 04/26/20  6:47 AM  Result Value Ref Range   LDH 159 98 - 192 U/L    Comment: Performed at [facility], [address], [place], [place] [address]  Heparin level  (unfractionated)     Status: Abnormal   Collection Time: 04/26/20  6:47 AM  Result Value Ref Range   Heparin Unfractionated <0.10 (L) 0.30 - 0.70 IU/mL    Comment: (NOTE) If heparin results are below expected values, and patient dosage has  been confirmed, suggest follow up testing of antithrombin III levels. Performed at [facility], [address], [place], [place] [address]   Basic metabolic panel     Status: Abnormal   Collection Time: 04/26/20  6:47 AM  Result Value Ref Range   Sodium 140 135 - 145 mmol/L   Potassium 4.1 3.5 - 5.1 mmol/L   Chloride 106 98 - 111 mmol/L   CO2 25 22 - 32 mmol/L   Glucose, Bld 169 (H) 70 - 99 mg/dL    Comment: Glucose reference range applies only to samples taken after fasting for at least 8 hours.   BUN 7 (L) 8 - 23 mg/dL   Creatinine, Ser 9.14 0.61 - 1.24 mg/dL   Calcium 9.2 8.9 - 78.2 mg/dL   GFR calc non Af Amer >60 >60 mL/min   GFR calc Af Amer >60 >60 mL/min   Anion gap 9 5 - 15    Comment: Performed at [facility], [address], [place], [place] [address]  CBC     Status: Abnormal   Collection Time: 04/26/20  6:47 AM  Result Value Ref Range   WBC 9.0 4.0 - 10.5 K/uL   RBC 3.50 (L) 4.22 - 5.81 MIL/uL   Hemoglobin 10.3 (L) 13.0 - 17.0 g/dL   HCT 30.8 (L) 65.7 - 84.6 %   MCV 92.0 80.0 - 100.0 fL   MCH 29.4 26.0 - 34.0 pg   MCHC 32.0 30.0 - 36.0 g/dL   RDW 96.2 95.2 - 84.1 %   Platelets 326 150 - 400 K/uL   nRBC 0.0 0.0 - 0.2 %    Comment: Performed at [facility], [address], [place], [place] [address]  Hepatic function panel     Status: Abnormal   Collection Time: 04/26/20  6:47 AM  Result Value Ref Range   Total Protein 6.0 (L) 6.5 - 8.1 g/dL   Albumin 2.9 (L) 3.5 - 5.0 g/dL   AST 15 15 - 41 U/L   ALT 10 0 - 44 U/L   Alkaline Phosphatase 76 38 - 126 U/L   Total Bilirubin 0.6 0.3 - 1.2 mg/dL   Bilirubin, Direct <1.0 0.0 - 0.2 mg/dL   Indirect Bilirubin NOT CALCULATED 0.3 - 0.9 mg/dL    Comment:  Performed at [facility], [address], [place], [place] [address]  Glucose, capillary     Status: Abnormal   Collection Time: 04/26/20  7:38 AM  Result Value Ref Range   Glucose-Capillary 195 (H) 70 - 99 mg/dL    Comment: Glucose reference  range applies only to samples taken after fasting for at least 8 hours.  Glucose, capillary     Status: None   Collection Time: 04/26/20 11:05 AM  Result Value Ref Range   Glucose-Capillary 86 70 - 99 mg/dL    Comment: Glucose reference range applies only to samples taken after fasting for at least 8 hours.    DG Chest Port 1 View  Result Date: 04/26/2020 CLINICAL DATA:  LVAD EXAM: PORTABLE CHEST 1 VIEW COMPARISON:  Portable exam 0617 hours compared to 04/20/2020 FINDINGS: LVAD projects over cardiac apex. Enlargement of cardiac silhouette post median sternotomy. Mediastinal contours and pulmonary vascularity normal. Lungs clear. No infiltrate, pleural effusion, or pneumothorax. Osseous structures unremarkable. IMPRESSION: Enlargement of cardiac silhouette post LVAD. No acute abnormalities. Electronically Signed   By: Ulyses Southward M.D.   On: 04/26/2020 09:06   VAS [name] LOWER EXTREMITY VENOUS (DVT)  Result Date: 04/26/2020  Lower Venous DVTStudy Indications: Edema, and Post-op.  Comparison Study: 07/22/18 DVT right pero Performing Technologist: Jeb Levering RDMS, RVT  Examination Guidelines: A complete evaluation includes B-mode imaging, spectral Doppler, color Doppler, and power Doppler as needed of all accessible portions of each vessel. Bilateral testing is considered an integral part of a complete examination. Limited examinations for reoccurring indications may be performed as noted. The reflux portion of the exam is performed with the patient in reverse Trendelenburg.  +---------+---------------+---------+-----------+----------+--------------+  RIGHT     Compressibility Phasicity Spontaneity Properties Thrombus Aging   +---------+---------------+---------+-----------+----------+--------------+  CFV       Full            Yes       Yes                                    +---------+---------------+---------+-----------+----------+--------------+  SFJ       Full                                                             +---------+---------------+---------+-----------+----------+--------------+  FV Prox   Full                                                             +---------+---------------+---------+-----------+----------+--------------+  FV Mid    Full                                                             +---------+---------------+---------+-----------+----------+--------------+  FV Distal Full                                                             +---------+---------------+---------+-----------+----------+--------------+  PFV       Full                                                             +---------+---------------+---------+-----------+----------+--------------+  POP       Full            Yes       Yes                                    +---------+---------------+---------+-----------+----------+--------------+  PTV       Full                                                             +---------+---------------+---------+-----------+----------+--------------+  PERO      Full                                                             +---------+---------------+---------+-----------+----------+--------------+   +---------+---------------+---------+-----------+----------+--------------+  LEFT      Compressibility Phasicity Spontaneity Properties Thrombus Aging  +---------+---------------+---------+-----------+----------+--------------+  CFV       Full            Yes       Yes                                    +---------+---------------+---------+-----------+----------+--------------+  SFJ       Full                                                              +---------+---------------+---------+-----------+----------+--------------+  FV Prox   Full                                                             +---------+---------------+---------+-----------+----------+--------------+  FV Mid    Full                                                             +---------+---------------+---------+-----------+----------+--------------+  FV Distal Full                                                             +---------+---------------+---------+-----------+----------+--------------+  PFV       Full                                                             +---------+---------------+---------+-----------+----------+--------------+  POP       Full            Yes       Yes                                    +---------+---------------+---------+-----------+----------+--------------+  PTV       Full                                                             +---------+---------------+---------+-----------+----------+--------------+  PERO      Full                                                             +---------+---------------+---------+-----------+----------+--------------+     Summary: RIGHT: - There is no evidence of deep vein thrombosis in the lower extremity.  - No cystic structure found in the popliteal fossa.  LEFT: - There is no evidence of deep vein thrombosis in the lower extremity.  - No cystic structure found in the popliteal fossa. - Ultrasound characteristics of enlarged lymph nodes noted in the groin.  *See table(s) above for measurements and observations.    Preliminary    [name] EKG SITE RITE  Result Date: 04/26/2020 If Site Rite image not attached, placement could not be confirmed due to current cardiac rhythm.   Review of Systems  Constitutional: Negative.   Respiratory: Negative.   Cardiovascular: Negative.   Gastrointestinal: Negative.   Skin: Positive for rash (left antecubital fossa).  Neurological: Negative for focal weakness.   Blood  pressure (!) 109/91, pulse 85, temperature 98 F (36.7 C), temperature source Oral, resp. rate 20, height 5\' 11"  (1.803 m), weight 91 kg, SpO2 99 %. Physical Exam  Constitutional: He is oriented to person, place, and time.  Elderly BM   HENT:  Head: Normocephalic and atraumatic.  Eyes: Pupils are equal, round, and reactive to light.  Cardiovascular:  LVAD  Respiratory: Effort normal. He exhibits tenderness (mild). He exhibits no mass, no edema and no swelling.    Wound vac in place over midline sternum, 200 cc serosanguinous drainage noted in canister. Good seal noted. No periwound erythema.   GI: Soft. He exhibits no distension.  Driveline dressing in place - LLQ  Neurological: He is alert and oriented to person, place, and time.  Skin: Skin is warm and dry. No erythema.  Psychiatric: He has a normal mood and affect. Judgment normal.    Assessment/Plan:  Sternal wound:   Plan for sternal wound debridement, possible muscle flap to sternum, application of A-cell, application of wound vac tomorrow with Dr. and Dr. Ulice Bold.  NPO after midnight tonight.  Optimize nutritional status with MV w/ vit C, protein shakes as able.  Discussed plan with patient, all of his questions were answered to his content. He is ready for surgery and in good spirits.   Appreciate the opportunity to participate in his care.   Donata Clay Chance Munter, PA-C 04/26/2020, 1:09 PM

## 2020-04-26 NOTE — Progress Notes (Addendum)
Patient ID: Theodore Welch, male   DOB: 1961/05/11, 59 y.o.   MRN: [id number]   Advanced Heart Failure VAD Team Note  PCP-Cardiologist: No primary care provider on file.   Subjective:    Here w/ subxiphoid abscess and cellulitis + bleeding from wound site. Admit Hgb 7.4. COVID +.   Speed increased back to 5700.   S/p multiple transfusions. Has received total of 3 units PRBCs and 5 units FFP. Hgb stabilized.   Blood cultures 1/2 positive for MSSA.  On cefazolin and rifabutin added .    Went to OR 6/1 for I&D of chest wound + repair of VAD outflow graft. Required fem-fem cardiopulmonary bypass. Wound vac placed. Back to OR 6/4 for debridement and wound vac change.   Repeat COVID-19 test negative 6/5.   Yesterday he was diuresed with IV lasix x2. Negative > 2 liters.   Frustrated this morning because PICC line came out.   LVAD INTERROGATION:  HeartMate III LVAD:   Flow 4.6  liters/min, speed 5700, power 4  PI 3.8 . Rare PI events.    Objective:    Vital Signs:   Temp:  [97.2 F (36.2 C)-99.1 F (37.3 C)] 97.9 F (36.6 C) (06/09 0728) Pulse Rate:  [80-91] 80 (06/09 0728) Resp:  [14-21] 16 (06/09 0728) BP: (99-123)/(59-95) 119/81 (06/09 0728) SpO2:  [96 %-100 %] 98 % (06/09 0728) Weight:  [91 kg] 91 kg (06/09 0600) Last BM Date: 04/24/20 Mean arterial Pressure 90s  Intake/Output:   Intake/Output Summary (Last 24 hours) at 04/26/2020 0815 Last data filed at 04/26/2020 0600 Gross per 24 hour  Intake 808 ml  Output 2900 ml  Net -2092 ml     Physical Exam    Physical Exam: GENERAL: No acute distress. HEENT: normal  NECK: Supple, JVP  6-7 .  2+ bilaterally, no bruits.  No lymphadenopathy or thyromegaly appreciated.   CARDIAC:  Mechanical heart sounds with LVAD hum present. VAC in place.  LUNGS:  Clear to auscultation bilaterally.  ABDOMEN:  Soft, round, nontender, positive bowel sounds x4.     LVAD exit site: Dressing dry and intact.  No erythema or drainage.   Stabilization device present and accurately applied.  Driveline dressing is being changed daily per sterile technique. EXTREMITIES:  Warm and dry, no cyanosis, clubbing, rash or edema  NEUROLOGIC:  Alert and oriented x 3.  No aphasia.  No dysarthria.  Affect pleasant.       Telemetry  NSR   EKG   n/a   Labs   Basic Metabolic Panel: Recent Labs  Lab 04/22/20 1303 04/22/20 1303 04/23/20 0348 04/23/20 0348 04/24/20 0455 04/25/20 0540 04/26/20 0647  NA 140  --  143  --  143 142 140  K 3.9  --  3.9  --  4.0 4.0 4.1  CL 105  --  107  --  109 107 106  CO2 27  --  25  --  27 27 25   GLUCOSE 146*  --  127*  --  142* 131* 169*  BUN 9  --  11  --  10 5* 7*  CREATININE 0.76  --  1.00  --  0.84 0.72 0.84  CALCIUM 9.3   < > 8.8*   < > 8.8* 9.0 9.2   < > = values in this interval not displayed.    Liver Function Tests: Recent Labs  Lab 04/26/20 0647  AST 15  ALT 10  ALKPHOS 76  BILITOT 0.6  PROT  6.0*  ALBUMIN 2.9*   No results for input(s): LIPASE, AMYLASE in the last 168 hours. No results for input(s): AMMONIA in the last 168 hours.  CBC: Recent Labs  Lab 04/22/20 1303 04/23/20 0348 04/24/20 0455 04/25/20 0540 04/26/20 0647  WBC 14.3* 9.8 9.4 8.7 9.0  HGB 10.4* 10.3* 10.2* 10.1* 10.3*  HCT 31.4* 31.5* 31.3* 31.3* 32.2*  MCV 90.5 92.1 92.6 92.9 92.0  PLT 221 238 270 292 326    INR: Recent Labs  Lab 04/22/20 0250 04/23/20 0348 04/24/20 0455 04/25/20 0540 04/26/20 0647  INR 1.0 1.0 1.0 1.0 1.0    Other results:  EKG:    Imaging   [name] EKG SITE RITE  Result Date: 04/26/2020 If Site Rite image not attached, placement could not be confirmed due to current cardiac rhythm.  [name] EKG SITE RITE  Result Date: 04/24/2020 If Site Rite image not attached, placement could not be confirmed due to current cardiac rhythm.    Medications:     Scheduled Medications: . amLODipine  5 mg Oral Daily  . aspirin  81 mg Oral Daily  . gabapentin  600 mg Oral QID  .  hydrALAZINE  50 mg Oral Q8H  . insulin aspart  0-15 Units Subcutaneous TID WC  . insulin aspart  0-5 Units Subcutaneous QHS  . mupirocin cream   Topical BID  . pantoprazole  40 mg Oral Daily  . rifabutin  300 mg Oral Daily  . sacubitril-valsartan  1 tablet Oral BID  . spironolactone  25 mg Oral Daily  . thiamine  100 mg Oral Daily  . varenicline  1 mg Oral Daily  . vitamin B-12  1,000 mcg Oral Daily  . zinc sulfate  220 mg Oral BID    Infusions: .  ceFAZolin (ANCEF) IV 2 g (04/26/20 0522)  . dextrose 5 % and 0.45% NaCl 50 mL/hr at 04/25/20 0309  . heparin 900 Units/hr (04/24/20 1802)  . norepinephrine (LEVOPHED) Adult infusion Stopped (04/19/20 1110)    PRN Medications: acetaminophen, bisacodyl, fentaNYL (SUBLIMAZE) injection, fluticasone, hydrALAZINE, Ipratropium-Albuterol, ondansetron (ZOFRAN) IV, oxyCODONE, sodium chloride flush, zolpidem   Assessment/Plan:    1. 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 He previously required debridement of lower sternalwound with woundVAC therapy and IV antibiotics.This was completed with 3 surgical procedures including debridement and ACell application in March 2021.  More recently, the lower sternal wound reopened with drainage again culturing MSSA and he had been on cephalexin + doxycycline.  The day of admission, he developed persistent bleeding from the wound site and came to ER, bleeding now stopped with suture.  Bleeding again 5/30, now stopped again. Got an additional 1 units PRBCs on 5/31 and again 6/1. Overall he has received 3 units PRBCs + 5 units FFP.  ID consulted and switched antibiotics to cefazolin.  1/2 blood cultures + for MSSA. CT chest/abd/pelvis--> Stable small amount of soft tissue tracking along the proximal drive line in the subcutaneous ventral left abdominal wall.  S/p I&D + wound vac placement on 6/1.   Back to OR for debridement and wound vac change on 6/4.   -  Continue cefazolin IV + rifabutin . ID looking into cost of rifabutin.  - PiCC out this morning will need to replace.   - Plan for Thursday am muscle flap closure with plastic surgery.   - Coumadin on hold for procedures. Continue IV heparin.  2. COVID-19 infection: Asymptomatic. No cough/dyspnea.  Repeat COVID-19  test negative 6/5. - He is off isolation.    3. 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 LVAD parameters stable.  Has been NYHA class II.  MAPs low initially, started on phenylephrine (may have been at least partly vagal as very diaphoretic with bleeding).  Now off phenylephrine and NE.  -Maps 90s. No weight this morning.  - Volume status improved after a couple doses of lasix.  - Continue spironolactone 25 mg daily.  - Continue Entresto to home 97/103 bid dosing.  - Increase amlodipine to 10 mg daily.   - Continue hydralazine to 50 mg tid - Remains on heparin drip for surgery Thursday.  - Continue ASA 81.  - Continue  5700 rpm.  4. Smoking:He has cut back a lot, still taking Chantix. Encouraged to quit. 5. H/o RLE DVT: On warfarin for LVAD but held for wound exploration 6. OSA: Continue CPAP.  7. Hyperlipidemia: Atorvastatin.  8. Type II diabetes:SSI 9. Anemia, Acute blood loss: Hgb has stabilized.    I reviewed the LVAD parameters from today, and compared the results to the patient's prior recorded data.  No programming changes were made.  The LVAD is functioning within specified parameters.  The patient performs LVAD self-test daily.  LVAD interrogation was negative for any significant power changes, alarms or PI events/speed drops.  LVAD equipment check completed and is in good working order.  Back-up equipment present.   LVAD education done on emergency procedures and precautions and reviewed exit site care.  Length of Stay: [place] NP-C  04/26/2020 8:15 AM  VAD Team --- VAD ISSUES ONLY--- Pager [phone] (7am -  7am)  Advanced Heart Failure Team  Pager [phone] (M-F; 7a - 4p)  Please contact [facility] for night-coverage after hours (4p -7a ) and weekends on [email or web address]  Patient seen with NP, agree with the above note.   I/Os negative with Lasix yesterday.  MAP around 90.  PICC line came out by accident.   General: Well appearing this am. NAD.  HEENT: Normal. Neck: Supple, JVP 7-8 cm. Carotids OK.  Cardiac:  Mechanical heart sounds with LVAD hum present.  Lungs:  CTAB, normal effort.  Abdomen:  NT, ND, no HSM. No bruits or masses. +BS  LVAD exit site: Sternal wound dressed.  Extremities:  Warm and dry. No cyanosis, clubbing, rash, or edema.  Neuro:  Alert & oriented x 3. Cranial nerves grossly intact. Moves all 4 extremities w/o difficulty. Affect pleasant    Can hold off on Lasix today, volume looks ok.   MAP still mildly elevated, increase amlodipine to 10 mg daily.   Continue heparin gtt while off warfarin.   MSSA covering with cefazolin and rifabutin.   To OR for muscle flap coverage on Thursday.   Loralie Champagne 04/26/2020 8:53 AM

## 2020-04-26 NOTE — TOC Benefit Eligibility Note (Signed)
Transition of Care [facility]) Benefit Eligibility Note    Patient Details  Name: [name] MRN: [id number] Date of Birth: [birth year]   Medication/Dose: RIFAMPIN 150 MG BID   CO-PAY- $1.30     and    RIFAMPIN  150 MG BID  COPAY-41.30  Covered?: Yes  Tier: (TIER- 4 DRUG)  Prescription Coverage Preferred Pharmacy: [facility], [facility] [employer] [facility]  Spoke with Person/Company/Phone Number:: SHAUNTAY   @   [employer]  RX # [phone]  Co-Pay: $1.30  Prior Approval: No  Deductible: (NO DEDUCTIBLE WITH PLAN  and  PATIENT HAS LOWE INCOME SUBSIDY)  Additional Notes: MEDICAID [place] ACCESS   CO-PAY- $4.00 FOR EACH PRESCRIPTION    Mardene Sayer Phone Number: 04/26/2020, 5:51 PM

## 2020-04-26 NOTE — Progress Notes (Signed)
Lower venous duplex       has been completed. Preliminary results can be found under CV proc through chart review. Taleigha Pinson, BS, RDMS, RVT   

## 2020-04-26 NOTE — Plan of Care (Signed)

## 2020-04-26 NOTE — Progress Notes (Signed)
Anesthesia called concerning covid test results if should treat pt as positive or negative for OR tomorrow. Talked with Dr. Luciana Axe, and stated that pt has completed his treatment and his off isolation. So negative test result ok

## 2020-04-26 NOTE — Progress Notes (Signed)
ANTICOAGULATION CONSULT NOTE   Pharmacy Consult for heparin - per TCTS dosing Indication: LVAD  Allergies  Allergen Reactions  . Chlorhexidine Rash  . Other Rash    Prep pads    Patient Measurements: Height: 5\' 11"  (180.3 cm) Weight: 91 kg (200 lb 9.9 oz)(scale B ) IBW/kg (Calculated) : 75.3 Heparin Dosing Weight: ~ 90 kg  Vital Signs: Temp: 97.9 F (36.6 C) (06/09 0728) Temp Source: Oral (06/09 0728) BP: 119/81 (06/09 0728) Pulse Rate: 80 (06/09 0728)  Labs: Recent Labs    04/24/20 0455 04/24/20 0455 04/25/20 0540 04/26/20 0647  HGB 10.2*   < > 10.1* 10.3*  HCT 31.3*  --  31.3* 32.2*  PLT 270  --  292 326  LABPROT 13.0  --  12.4 12.9  INR 1.0  --  1.0 1.0  HEPARINUNFRC <0.10*  --  <0.10* <0.10*  CREATININE 0.84  --  0.72 0.84   < > = values in this interval not displayed.    Estimated Creatinine Clearance: 106.6 mL/min (by C-G formula based on SCr of 0.84 mg/dL).   Medical History: Past Medical History:  Diagnosis Date  . 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 Cardiomyopathy, unspecified (HCC)   . CHF (congestive heart failure) (HCC)   . Chronic back pain   . Diabetes mellitus without complication (HCC)   . Enlarged heart   . Gastric ulcer   . Gastroenteritis   . H/O degenerative disc disease   . Hypertension   . LVAD (left ventricular assist device) present (HCC)     Medications:   Infusions:  .  ceFAZolin (ANCEF) IV 2 g (04/26/20 0522)  . dextrose 5 % and 0.45% NaCl 50 mL/hr at 04/25/20 0309  . heparin 900 Units/hr (04/24/20 1802)  . norepinephrine (LEVOPHED) Adult infusion Stopped (04/19/20 1110)    Assessment: 59 yo male with LVAD - HM3 + ASA 81mg  daily, MSSA bacteremia and  driveline infection.  Chronic Coumadin on hold for procedures - plan flap 6/10.  Heparin started 6/3 at low dose.  Currently heparin level remains undetectable on 900 units/hr. - No titrations per MD for now. Hgb 10.3, plt 326. LDH 159. No overt bleeding or complications  noted.  Goal of Therapy:  Monitor platelets by anticoagulation protocol: Yes   Plan:  Continue IV Heparin  900 units/hr per discussion with Dr. 77 No plans to titrate. Daily heparin level and CBC. Follow up post procedure completion to restart PTA warfarin  , PharmD, BCCCP Clinical Pharmacist  Phone: [phone] 04/26/2020 8:15 AM  Please check AMION for all [facility] phone numbers After 10:00 PM, call Main Pharmacy [phone]

## 2020-04-27 ENCOUNTER — Inpatient Hospital Stay (HOSPITAL_COMMUNITY): Payer: Medicare HMO | Admitting: Certified Registered"

## 2020-04-27 ENCOUNTER — Encounter (HOSPITAL_COMMUNITY): Payer: Self-pay | Admitting: Cardiology

## 2020-04-27 ENCOUNTER — Encounter (HOSPITAL_COMMUNITY)
Admission: EM | Disposition: A | Discharge status: Home / Self Care | Payer: Self-pay | Source: Home / Self Care | Attending: Cardiology

## 2020-04-27 ENCOUNTER — Inpatient Hospital Stay (HOSPITAL_COMMUNITY): Payer: Medicare HMO

## 2020-04-27 DIAGNOSIS — T827XXA Infection and inflammatory reaction due to other cardiac and vascular devices, implants and grafts, initial encounter: Secondary | ICD-10-CM

## 2020-04-27 DIAGNOSIS — L02213 Cutaneous abscess of chest wall: Secondary | ICD-10-CM

## 2020-04-27 HISTORY — PX: STERNAL WOUND DEBRIDEMENT: SHX1058

## 2020-04-27 HISTORY — PX: APPLICATION OF A-CELL OF CHEST/ABDOMEN: SHX6302

## 2020-04-27 HISTORY — PX: PECTORALIS FLAP: SHX6228

## 2020-04-27 HISTORY — PX: APPLICATION OF WOUND VAC: SHX5189

## 2020-04-27 LAB — CBC
HCT: 33 % — ABNORMAL LOW (ref 39.0–52.0)
HCT: 28.8 % — ABNORMAL LOW (ref 39.0–52.0)
Hemoglobin: 10.4 g/dL — ABNORMAL LOW (ref 13.0–17.0)
Hemoglobin: 9.2 g/dL — ABNORMAL LOW (ref 13.0–17.0)
MCH: 29.2 pg (ref 26.0–34.0)
MCH: 29.8 pg (ref 26.0–34.0)
MCHC: 31.5 g/dL (ref 30.0–36.0)
MCHC: 31.9 g/dL (ref 30.0–36.0)
MCV: 92.7 fL (ref 80.0–100.0)
MCV: 93.2 fL (ref 80.0–100.0)
Platelets: 329 10*3/uL (ref 150–400)
Platelets: 317 10*3/uL (ref 150–400)
RBC: 3.56 MIL/uL — ABNORMAL LOW (ref 4.22–5.81)
RBC: 3.09 MIL/uL — ABNORMAL LOW (ref 4.22–5.81)
RDW: 13.6 % (ref 11.5–15.5)
RDW: 13.8 % (ref 11.5–15.5)
WBC: 8.2 10*3/uL (ref 4.0–10.5)
WBC: 19.2 10*3/uL — ABNORMAL HIGH (ref 4.0–10.5)
nRBC: 0 % (ref 0.0–0.2)
nRBC: 0 % (ref 0.0–0.2)

## 2020-04-27 LAB — BASIC METABOLIC PANEL
Anion gap: 8 (ref 5–15)
BUN: 6 mg/dL — ABNORMAL LOW (ref 8–23)
CO2: 27 mmol/L (ref 22–32)
Calcium: 9.3 mg/dL (ref 8.9–10.3)
Chloride: 106 mmol/L (ref 98–111)
Creatinine, Ser: 0.74 mg/dL (ref 0.61–1.24)
GFR calc Af Amer: >60 mL/min (ref >60)
GFR calc non Af Amer: >60 mL/min (ref >60)
Glucose, Bld: 138 mg/dL — ABNORMAL HIGH (ref 70–99)
Potassium: 3.9 mmol/L (ref 3.5–5.1)
Sodium: 141 mmol/L (ref 135–145)

## 2020-04-27 LAB — PREPARE RBC (CROSSMATCH)

## 2020-04-27 LAB — GLUCOSE, CAPILLARY
Glucose-Capillary: 131 mg/dL — ABNORMAL HIGH (ref 70–99)
Glucose-Capillary: 96 mg/dL (ref 70–99)
Glucose-Capillary: 178 mg/dL — ABNORMAL HIGH (ref 70–99)

## 2020-04-27 LAB — PROTIME-INR
INR: 1 (ref 0.8–1.2)
Prothrombin Time: 13.2 seconds (ref 11.4–15.2)

## 2020-04-27 LAB — ACID FAST SMEAR (AFB, MYCOBACTERIA): Acid Fast Smear: NEGATIVE

## 2020-04-27 LAB — LACTATE DEHYDROGENASE: LDH: 168 U/L (ref 98–192)

## 2020-04-27 LAB — HEPARIN LEVEL (UNFRACTIONATED): Heparin Unfractionated: <0.1 IU/mL — ABNORMAL LOW (ref 0.30–0.70)

## 2020-04-27 SURGERY — DEBRIDEMENT, WOUND, STERNUM
Anesthesia: General | Site: Abdomen

## 2020-04-27 MED ORDER — SODIUM CHLORIDE 0.9 % IV SOLN: INTRAVENOUS | Status: DC | PRN

## 2020-04-27 MED ORDER — ONDANSETRON HCL 4 MG/2ML IJ SOLN
INTRAMUSCULAR | Status: DC | PRN
  Administered 2020-04-27: 4 mg via INTRAVENOUS

## 2020-04-27 MED ORDER — SACUBITRIL-VALSARTAN 97-103 MG PO TABS
1.0000 | ORAL_TABLET | Freq: Two times a day (BID) | ORAL | Status: DC
  Administered 2020-04-28 – 2020-04-29 (×3): 1 via ORAL
  Filled 2020-04-27 (×5): qty 1

## 2020-04-27 MED ORDER — SENNOSIDES-DOCUSATE SODIUM 8.6-50 MG PO TABS
1.0000 | ORAL_TABLET | Freq: Every day | ORAL | Status: DC
  Administered 2020-04-27 – 2020-04-28 (×2): 1 via ORAL
  Filled 2020-04-27 (×3): qty 1

## 2020-04-27 MED ORDER — FENTANYL CITRATE (PF) 100 MCG/2ML IJ SOLN
INTRAMUSCULAR | Status: AC
  Administered 2020-04-27: 50 ug via INTRAVENOUS
  Filled 2020-04-27: qty 2

## 2020-04-27 MED ORDER — ACETAMINOPHEN 500 MG PO TABS
1000.0000 mg | ORAL_TABLET | Freq: Four times a day (QID) | ORAL | Status: AC
  Administered 2020-04-27 – 2020-05-02 (×17): 1000 mg via ORAL
  Filled 2020-04-27 (×17): qty 2

## 2020-04-27 MED ORDER — MIDAZOLAM HCL 2 MG/2ML IJ SOLN
INTRAMUSCULAR | Status: AC
  Filled 2020-04-27: qty 2

## 2020-04-27 MED ORDER — NALOXONE HCL 0.4 MG/ML IJ SOLN: 0.4000 mg | INTRAMUSCULAR | Status: DC | PRN

## 2020-04-27 MED ORDER — ALBUMIN HUMAN 5 % IV SOLN: INTRAVENOUS | Status: DC | PRN

## 2020-04-27 MED ORDER — ONDANSETRON HCL 4 MG/2ML IJ SOLN: 4.0000 mg | Freq: Four times a day (QID) | INTRAMUSCULAR | Status: DC | PRN

## 2020-04-27 MED ORDER — DIPHENHYDRAMINE HCL 50 MG/ML IJ SOLN: 12.5000 mg | Freq: Four times a day (QID) | INTRAMUSCULAR | Status: DC | PRN

## 2020-04-27 MED ORDER — SODIUM CHLORIDE 0.9% FLUSH: 9.0000 mL | INTRAVENOUS | Status: DC | PRN

## 2020-04-27 MED ORDER — PROPOFOL 10 MG/ML IV BOLUS
INTRAVENOUS | Status: AC
  Filled 2020-04-27: qty 40

## 2020-04-27 MED ORDER — PHENYLEPHRINE HCL-NACL 10-0.9 MG/250ML-% IV SOLN
INTRAVENOUS | Status: DC | PRN
  Administered 2020-04-27: 20 ug/min via INTRAVENOUS

## 2020-04-27 MED ORDER — FENTANYL CITRATE (PF) 250 MCG/5ML IJ SOLN
INTRAMUSCULAR | Status: AC
  Filled 2020-04-27: qty 5

## 2020-04-27 MED ORDER — SODIUM CHLORIDE 0.9 % IV SOLN
20.0000 ug | Freq: Once | INTRAVENOUS | Status: AC
  Administered 2020-04-27: 20 ug via INTRAVENOUS
  Filled 2020-04-27: qty 5

## 2020-04-27 MED ORDER — ETOMIDATE 2 MG/ML IV SOLN
INTRAVENOUS | Status: DC | PRN
Start: 2020-04-27 — End: 2020-04-27
  Administered 2020-04-27: 12 mg via INTRAVENOUS

## 2020-04-27 MED ORDER — 0.9 % SODIUM CHLORIDE (POUR BTL) OPTIME
TOPICAL | Status: DC | PRN
  Administered 2020-04-27: 1000 mL

## 2020-04-27 MED ORDER — LIDOCAINE-EPINEPHRINE 1 %-1:100000 IJ SOLN
INTRAMUSCULAR | Status: DC | PRN
  Administered 2020-04-27: 10 mL

## 2020-04-27 MED ORDER — LIDOCAINE 2% (20 MG/ML) 5 ML SYRINGE
INTRAMUSCULAR | Status: DC | PRN
  Administered 2020-04-27: 50 mg via INTRAVENOUS

## 2020-04-27 MED ORDER — INSULIN ASPART 100 UNIT/ML ~~LOC~~ SOLN
0.0000 [IU] | Freq: Three times a day (TID) | SUBCUTANEOUS | Status: DC
  Administered 2020-04-27 – 2020-04-28 (×2): 4 [IU] via SUBCUTANEOUS
  Administered 2020-04-28 – 2020-04-29 (×2): 2 [IU] via SUBCUTANEOUS
  Administered 2020-05-02: 8 [IU] via SUBCUTANEOUS

## 2020-04-27 MED ORDER — OXYCODONE HCL 5 MG PO TABS
5.0000 mg | ORAL_TABLET | ORAL | Status: DC | PRN
  Administered 2020-05-03: 10 mg via ORAL
  Filled 2020-04-27: qty 2

## 2020-04-27 MED ORDER — MIDAZOLAM HCL 5 MG/5ML IJ SOLN
INTRAMUSCULAR | Status: DC | PRN
  Administered 2020-04-27: 2 mg via INTRAVENOUS

## 2020-04-27 MED ORDER — VANCOMYCIN HCL 1000 MG IV SOLR
INTRAVENOUS | Status: DC | PRN
  Administered 2020-04-27: 1000 mL

## 2020-04-27 MED ORDER — LIDOCAINE-EPINEPHRINE (PF) 1 %-1:200000 IJ SOLN
INTRAMUSCULAR | Status: AC
  Filled 2020-04-27: qty 30

## 2020-04-27 MED ORDER — ACETAMINOPHEN 160 MG/5ML PO SOLN
1000.0000 mg | Freq: Four times a day (QID) | ORAL | Status: AC
  Filled 2020-04-27: qty 40.6

## 2020-04-27 MED ORDER — ROCURONIUM BROMIDE 10 MG/ML (PF) SYRINGE
PREFILLED_SYRINGE | INTRAVENOUS | Status: DC | PRN
  Administered 2020-04-27 (×2): 30 mg via INTRAVENOUS
  Administered 2020-04-27: 70 mg via INTRAVENOUS

## 2020-04-27 MED ORDER — LACTATED RINGERS IV SOLN: INTRAVENOUS | Status: DC | PRN

## 2020-04-27 MED ORDER — DIPHENHYDRAMINE HCL 12.5 MG/5ML PO ELIX: 12.5000 mg | ORAL_SOLUTION | Freq: Four times a day (QID) | ORAL | Status: DC | PRN

## 2020-04-27 MED ORDER — LACTATED RINGERS IV SOLN
INTRAVENOUS | Status: DC | PRN
Start: 2020-04-27 — End: 2020-04-27

## 2020-04-27 MED ORDER — AMLODIPINE BESYLATE 10 MG PO TABS
10.0000 mg | ORAL_TABLET | Freq: Every day | ORAL | Status: DC
  Administered 2020-04-28 – 2020-04-29 (×2): 10 mg via ORAL
  Filled 2020-04-27 (×2): qty 1

## 2020-04-27 MED ORDER — SUGAMMADEX SODIUM 200 MG/2ML IV SOLN
INTRAVENOUS | Status: DC | PRN
  Administered 2020-04-27: 200 mg via INTRAVENOUS

## 2020-04-27 MED ORDER — TRAMADOL HCL 50 MG PO TABS: 50.0000 mg | ORAL_TABLET | Freq: Four times a day (QID) | ORAL | Status: DC | PRN

## 2020-04-27 MED ORDER — HYDRALAZINE HCL 50 MG PO TABS
50.0000 mg | ORAL_TABLET | Freq: Three times a day (TID) | ORAL | Status: DC
  Administered 2020-04-29: 50 mg via ORAL
  Filled 2020-04-27 (×2): qty 1

## 2020-04-27 MED ORDER — SODIUM CHLORIDE 0.9% IV SOLUTION: Freq: Once | INTRAVENOUS | Status: DC

## 2020-04-27 MED ORDER — BISACODYL 5 MG PO TBEC
10.0000 mg | DELAYED_RELEASE_TABLET | Freq: Every day | ORAL | Status: DC
  Administered 2020-04-27 – 2020-04-29 (×3): 10 mg via ORAL
  Filled 2020-04-27 (×3): qty 2

## 2020-04-27 MED ORDER — CEFAZOLIN SODIUM-DEXTROSE 2-4 GM/100ML-% IV SOLN: 2.0000 g | INTRAVENOUS | Status: DC

## 2020-04-27 MED ORDER — FENTANYL 50 MCG/ML IV PCA SOLN
INTRAVENOUS | Status: DC
  Administered 2020-04-28 (×6): 15 ug via INTRAVENOUS
  Administered 2020-04-29: 0 ug via INTRAVENOUS
  Administered 2020-04-29: 45 ug via INTRAVENOUS
  Administered 2020-04-29: 30 ug via INTRAVENOUS
  Administered 2020-04-29 – 2020-04-30 (×3): 15 ug via INTRAVENOUS
  Administered 2020-04-30: 75 ug via INTRAVENOUS
  Administered 2020-04-30: 0 ug via INTRAVENOUS
  Administered 2020-04-30: 15 ug via INTRAVENOUS
  Administered 2020-05-01: 1 ug via INTRAVENOUS
  Administered 2020-05-01: 0 ug via INTRAVENOUS
  Administered 2020-05-01: 15 ug via INTRAVENOUS
  Filled 2020-04-27: qty 20

## 2020-04-27 MED ORDER — FENTANYL CITRATE (PF) 250 MCG/5ML IJ SOLN
INTRAMUSCULAR | Status: DC | PRN
  Administered 2020-04-27: 50 ug via INTRAVENOUS
  Administered 2020-04-27: 150 ug via INTRAVENOUS
  Administered 2020-04-27 (×5): 50 ug via INTRAVENOUS

## 2020-04-27 MED ORDER — HEMOSTATIC AGENTS (NO CHARGE) OPTIME
TOPICAL | Status: DC | PRN
  Administered 2020-04-27 (×3): 1 via TOPICAL

## 2020-04-27 MED ORDER — DEXTROSE IN LACTATED RINGERS 5 % IV SOLN: INTRAVENOUS | Status: DC

## 2020-04-27 MED FILL — Sodium Chloride IV Soln 0.9%: INTRAVENOUS | Qty: 3000 | Status: AC

## 2020-04-27 MED FILL — Heparin Sodium (Porcine) Inj 1000 Unit/ML: INTRAMUSCULAR | Qty: 30 | Status: AC

## 2020-04-27 SURGICAL SUPPLY — 128 items
APPLIER CLIP 9.375 MED OPEN (MISCELLANEOUS)
ATCH SMKEVC FLXB CAUT HNDSWH (FILTER) ×2 IMPLANT
ATTRACTOMAT 16X20 MAGNETIC DRP (DRAPES) ×2 IMPLANT
BAG DECANTER FOR FLEXI CONT (MISCELLANEOUS) ×6 IMPLANT
BENZOIN TINCTURE PRP APPL 2/3 (GAUZE/BANDAGES/DRESSINGS) IMPLANT
BIOPATCH RED 1 DISK 7.0 (GAUZE/BANDAGES/DRESSINGS) ×2 IMPLANT
BIOPATCH RED 1IN DISK 7.0MM (GAUZE/BANDAGES/DRESSINGS)
BLADE CLIPPER SURG (BLADE) ×2 IMPLANT
BLADE SURG 10 STRL SS (BLADE) ×2 IMPLANT
BLADE SURG 15 STRL LF DISP TIS (BLADE) ×2 IMPLANT
BLADE SURG 15 STRL SS (BLADE)
BNDG GAUZE ELAST 4 BULKY (GAUZE/BANDAGES/DRESSINGS) IMPLANT
CANISTER SUCT 3000ML PPV (MISCELLANEOUS) ×6 IMPLANT
CANISTER WOUNDNEG PRESSURE 500 (CANNISTER) ×2 IMPLANT
CATH FOLEY 2WAY SLVR  5CC 16FR (CATHETERS)
CATH FOLEY 2WAY SLVR 5CC 16FR (CATHETERS) IMPLANT
CATH THORACIC 28FR RT ANG (CATHETERS) IMPLANT
CATH THORACIC 36FR (CATHETERS) IMPLANT
CHLORAPREP W/TINT 26 (MISCELLANEOUS) ×2 IMPLANT
CLIP APPLIE 9.375 MED OPEN (MISCELLANEOUS) IMPLANT
CLIP VESOCCLUDE SM WIDE 24/CT (CLIP) ×2 IMPLANT
CLOSURE WOUND 1/2 X4 (GAUZE/BANDAGES/DRESSINGS)
CNTNR URN SCR LID CUP LEK RST (MISCELLANEOUS) IMPLANT
CONN Y 3/8X3/8X3/8  BEN (MISCELLANEOUS)
CONN Y 3/8X3/8X3/8 BEN (MISCELLANEOUS) IMPLANT
CONT SPEC 4OZ STRL OR WHT (MISCELLANEOUS) ×8
CORD BIPOLAR FORCEPS 12FT (ELECTRODE) ×4 IMPLANT
COUNTER NEEDLE 20 DBL MAG RED (NEEDLE) ×2 IMPLANT
COVER SURGICAL LIGHT HANDLE (MISCELLANEOUS) ×6 IMPLANT
COVER WAND RF STERILE (DRAPES) ×2 IMPLANT
DRAIN CHANNEL 19F RND (DRAIN) ×4 IMPLANT
DRAPE HALF SHEET 40X57 (DRAPES) ×2 IMPLANT
DRAPE INCISE 23X17 IOBAN STRL (DRAPES)
DRAPE INCISE 23X17 STRL (DRAPES) ×2 IMPLANT
DRAPE INCISE IOBAN 23X17 STRL (DRAPES) IMPLANT
DRAPE LAPAROSCOPIC ABDOMINAL (DRAPES) ×2 IMPLANT
DRAPE ORTHO SPLIT 77X108 STRL (DRAPES)
DRAPE SLUSH/WARMER DISC (DRAPES) ×2 IMPLANT
DRAPE SURG 17X23 STRL (DRAPES) ×8 IMPLANT
DRAPE SURG ORHT 6 SPLT 77X108 (DRAPES) ×4 IMPLANT
DRAPE WARM FLUID 44X44 (DRAPES) ×2 IMPLANT
DRESSING PEEL AND PLAC PRVNA20 (GAUZE/BANDAGES/DRESSINGS) IMPLANT
DRSG AQUACEL AG ADV 3.5X14 (GAUZE/BANDAGES/DRESSINGS) ×4 IMPLANT
DRSG EMULSION OIL 3X3 NADH (GAUZE/BANDAGES/DRESSINGS) IMPLANT
DRSG MEPITEL 3X4 ME34 (GAUZE/BANDAGES/DRESSINGS) ×2 IMPLANT
DRSG PAD ABDOMINAL 8X10 ST (GAUZE/BANDAGES/DRESSINGS) ×2 IMPLANT
DRSG PEEL AND PLACE PREVENA 20 (GAUZE/BANDAGES/DRESSINGS) ×4
ELECT BLADE 4.0 EZ CLEAN MEGAD (MISCELLANEOUS) ×4
ELECT BLADE 6.5 EXT (BLADE) ×2 IMPLANT
ELECT CAUTERY BLADE 6.4 (BLADE) ×4 IMPLANT
ELECT REM PT RETURN 9FT ADLT (ELECTROSURGICAL) ×8
ELECTRODE BLDE 4.0 EZ CLN MEGD (MISCELLANEOUS) IMPLANT
ELECTRODE REM PT RTRN 9FT ADLT (ELECTROSURGICAL) ×4 IMPLANT
EVACUATOR SILICONE 100CC (DRAIN) ×4 IMPLANT
EVACUATOR SMOKE ACCUVAC VALLEY (FILTER)
FORCEPS BIPOLAR SPETZLER 8 1.0 (NEUROSURGERY SUPPLIES) ×2 IMPLANT
GAUZE 4X4 16PLY RFD (DISPOSABLE) ×2 IMPLANT
GAUZE SPONGE 4X4 12PLY STRL (GAUZE/BANDAGES/DRESSINGS) ×8 IMPLANT
GAUZE XEROFORM 5X9 LF (GAUZE/BANDAGES/DRESSINGS) IMPLANT
GLOVE BIO SURGEON STRL SZ 6.5 (GLOVE) ×5 IMPLANT
GLOVE BIO SURGEON STRL SZ7.5 (GLOVE) ×6 IMPLANT
GLOVE BIO SURGEONS STRL SZ 6.5 (GLOVE) ×3
GOWN STRL REUS W/ TWL LRG LVL3 (GOWN DISPOSABLE) ×12 IMPLANT
GOWN STRL REUS W/TWL LRG LVL3 (GOWN DISPOSABLE) ×20
HANDPIECE INTERPULSE COAX TIP (DISPOSABLE)
HEMOSTAT HEMOBLAST BELLOWS (HEMOSTASIS) ×6 IMPLANT
HEMOSTAT POWDER SURGIFOAM 1G (HEMOSTASIS) IMPLANT
HEMOSTAT SURGICEL 2X14 (HEMOSTASIS) IMPLANT
KIT BASIN OR (CUSTOM PROCEDURE TRAY) ×6 IMPLANT
KIT SUCTION CATH 14FR (SUCTIONS) IMPLANT
KIT TURNOVER KIT B (KITS) ×6 IMPLANT
MARKER SKIN DUAL TIP RULER LAB (MISCELLANEOUS) ×2 IMPLANT
MATRIX SURG PERF 6LAYER 10X15 (Tissue) ×2 IMPLANT
NDL 18GX1X1/2 (RX/OR ONLY) (NEEDLE) IMPLANT
NEEDLE 18GX1X1/2 (RX/OR ONLY) (NEEDLE) ×4 IMPLANT
NEEDLE 22X1 1/2 (OR ONLY) (NEEDLE) ×2 IMPLANT
NS IRRIG 1000ML POUR BTL (IV SOLUTION) ×8 IMPLANT
PACK CHEST (CUSTOM PROCEDURE TRAY) ×2 IMPLANT
PACK GENERAL/GYN (CUSTOM PROCEDURE TRAY) ×4 IMPLANT
PACK SURGICAL SETUP 50X90 (CUSTOM PROCEDURE TRAY) ×4 IMPLANT
PAD ABD 8X10 STRL (GAUZE/BANDAGES/DRESSINGS) ×4 IMPLANT
PAD ARMBOARD 7.5X6 YLW CONV (MISCELLANEOUS) ×14 IMPLANT
PENCIL BUTTON HOLSTER BLD 10FT (ELECTRODE) ×2 IMPLANT
PREFILTER EVAC NS 1 1/3-3/8IN (MISCELLANEOUS) ×2 IMPLANT
SET HNDPC FAN SPRY TIP SCT (DISPOSABLE) IMPLANT
SOL PREP POV-IOD 4OZ 10% (MISCELLANEOUS) IMPLANT
SPONGE LAP 18X18 RF (DISPOSABLE) ×10 IMPLANT
STAPLER VISISTAT 35W (STAPLE) ×2 IMPLANT
STRIP CLOSURE SKIN 1/2X4 (GAUZE/BANDAGES/DRESSINGS) ×2 IMPLANT
SUT CHROMIC 4 0 P 3 18 (SUTURE) IMPLANT
SUT ETHIBOND 0 MO6 C/R (SUTURE) ×4 IMPLANT
SUT ETHILON 3 0 FSL (SUTURE) IMPLANT
SUT ETHILON 4 0 PS 2 18 (SUTURE) IMPLANT
SUT ETHILON 5 0 P 3 18 (SUTURE)
SUT MNCRL AB 3-0 PS2 18 (SUTURE) IMPLANT
SUT MNCRL+ AB 3-0 CT1 36 (SUTURE) IMPLANT
SUT MON AB 2-0 CT1 36 (SUTURE) ×12 IMPLANT
SUT MON AB 4-0 PS1 27 (SUTURE) ×8 IMPLANT
SUT MON AB 5-0 PS2 18 (SUTURE) ×12 IMPLANT
SUT MONOCRYL AB 3-0 CT1 36IN (SUTURE) ×12
SUT NYLON ETHILON 5-0 P-3 1X18 (SUTURE) IMPLANT
SUT PDS AB 0 CT 36 (SUTURE) IMPLANT
SUT PROLENE 3 0 PS 1 (SUTURE) IMPLANT
SUT SILK 3 0SH CR/8 30 (SUTURE) ×2 IMPLANT
SUT STEEL 6MS V (SUTURE) IMPLANT
SUT STEEL STERNAL CCS#1 18IN (SUTURE) IMPLANT
SUT STEEL SZ 6 DBL 3X14 BALL (SUTURE) IMPLANT
SUT VIC AB 1 CTX 36 (SUTURE)
SUT VIC AB 1 CTX36XBRD ANBCTR (SUTURE) ×4 IMPLANT
SUT VIC AB 2-0 CTX 27 (SUTURE) ×4 IMPLANT
SUT VIC AB 3-0 FS2 27 (SUTURE) IMPLANT
SUT VIC AB 3-0 SH 8-18 (SUTURE) ×2 IMPLANT
SUT VIC AB 3-0 X1 27 (SUTURE) ×4 IMPLANT
SUT VICRYL 4-0 PS2 18IN ABS (SUTURE) ×8 IMPLANT
SWAB COLLECTION DEVICE MRSA (MISCELLANEOUS) IMPLANT
SWAB CULTURE ESWAB REG 1ML (MISCELLANEOUS) IMPLANT
SYR 10ML LL (SYRINGE) ×2 IMPLANT
SYR 50ML SLIP (SYRINGE) IMPLANT
SYR 5ML LL (SYRINGE) IMPLANT
SYR BULB EAR ULCER 3OZ GRN STR (SYRINGE) IMPLANT
SYR BULB IRRIG 60ML STRL (SYRINGE) ×2 IMPLANT
TAPE CLOTH SURG 4X10 WHT LF (GAUZE/BANDAGES/DRESSINGS) ×2 IMPLANT
TOWEL GREEN STERILE (TOWEL DISPOSABLE) ×6 IMPLANT
TOWEL GREEN STERILE FF (TOWEL DISPOSABLE) ×6 IMPLANT
TRAY FOLEY MTR SLVR 14FR STAT (SET/KITS/TRAYS/PACK) ×4 IMPLANT
TUBE CONNECTING 12'X1/4 (SUCTIONS) ×1
TUBE CONNECTING 12X1/4 (SUCTIONS) ×3 IMPLANT
WATER STERILE IRR 1000ML POUR (IV SOLUTION) ×4 IMPLANT

## 2020-04-27 NOTE — Anesthesia Procedure Notes (Signed)
Procedure Name: Intubation Date/Time: 04/27/2020 8:17 AM Performed by: Griffin Dakin, CRNA Pre-anesthesia Checklist: Patient identified, Emergency Drugs available, Suction available and Patient being monitored Patient Re-evaluated:Patient Re-evaluated prior to induction Oxygen Delivery Method: Circle system utilized Preoxygenation: Pre-oxygenation with 100% oxygen Induction Type: IV induction Ventilation: Mask ventilation without difficulty Laryngoscope Size: Mac and 4 Grade View: Grade II Tube type: Oral Tube size: 8.0 mm Number of attempts: 1 Airway Equipment and Method: Stylet Placement Confirmation: ETT inserted through vocal cords under direct vision,  positive ETCO2 and breath sounds checked- equal and bilateral Secured at: 23 cm Tube secured with: Tape Dental Injury: Teeth and Oropharynx as per pre-operative assessment

## 2020-04-27 NOTE — Progress Notes (Addendum)
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   HM III LVAD implanted on 08/03/18 by Dr. Laneta Simmers under Destination Therapy criteria due to smoking status.  Pt awake, alert, lying in bed in holding room prior to surgery this am. Pt denies complaints, says he's "ready to get this done" so he can go home. He says Dr. Maren Beach told him he would spend a few days in ICU.    Vital signs: Temp: 98.8 HR: 81 Doppler Pressure: 94 Auto cuff: 94/77 (80) O2 Sat: 98% on RA Wt: 197>189>189>203.7>208.7>207.6>200.8>200.6>200.6>193.5 lbs   LVAD interrogation reveals:  Speed: 5500 Flow: 4.6 Power: 4.5w PI: 2.9  Alarms: none Events: >32 PI events yesterday; 67 PI events so far today Hematocrit: 32  Fixed speed: 5700 Low speed limit: 5400  Drive Line: Daily dressing change per bedside RN. Clean with betadine, no skin prep (skin intolerance). Pack with 4 x 4 soaked in Dakins solution, cover with dry gauze. Change as needed to keep dressing C/D/I.     Labs:  LDH trend: 182>194>140>252>191>162>160>168>159>168  INR trend: 2.5>1.9>1.5>1.4>1.3>1.2>1.1>1.0>1.0>1.0>1.0  Anticoagulation Plan: -INR Goal: 2.0 - 2.5 - warfarin on hold -ASA Dose: 81 mg daily - on hold  Blood Products:  - 04/16/20>FFP x 2; PRBC x 1 - 04/17/20>>FFP x 1; PRBC x 1  Intra-op: - 04/18/20>>PRBC x 6; Plts x 2; FFP x 2; Cell saver 2330 ml  Device: N/A  Respiratory: intubated intra op 04/18/20 - extubated 04/19/20 am   Infection:  - 04/16/20>>Covid positive - 04/16/20>>BCs staph aureus - 04/18/20>>sternal wound culture intra-op>>staph aureus - 04/20/20>>BCs pending - 04/22/20>> COVID negative  Gtts: - Heparin 900 units/hr   Plan/Recommendations:  1. Call VAD Coordinator if any VAD equipment or drive line issues. 2. Daily drive line dressing change per bedside RN   Hessie Diener RN VAD Coordinator  Office: [phone]  24/7 Pager: [phone]

## 2020-04-27 NOTE — OR Nursing (Signed)
Forty-five minute call to [facility] charge nurse made at [phone]. Spoke to [place]. Twenty minute call to 2 Heart Charge nurse made at 1136. Spoke to [place].

## 2020-04-27 NOTE — Progress Notes (Signed)
Day of Surgery Procedure(s) (LRB): STERNAL WOUND DEBRIDEMENT (N/A) MUSCLE FLAP TO STERNUM (N/A) APPLICATION OF A-CELL OF CHEST (N/A) APPLICATION OF PREVENA INCISIONAL WOUND VAC (N/A) Subjective: Hemodynamically stable after muscle flap using right rectus to cover open lower substernal wound with exposed power cord of VAD.  Moderate drainage from Jackson-Pratt drains in the left pectoral and abdominal locations.  Postop hemoglobin slightly decreased from 10.5 down to 9.2 VAD flows satisfactory. Objective: Vital signs in last 24 hours: Temp:  [98 F (36.7 C)-98.8 F (37.1 C)] 98 F (36.7 C) (06/10 1414) Pulse Rate:  [80-101] 80 (06/10 0331) Cardiac Rhythm: Normal sinus rhythm (06/10 1300) Resp:  [20-23] 23 (06/10 1425) BP: (93-109)/(63-88) 93/63 (06/10 0331) SpO2:  [98 %-100 %] 100 % (06/10 1425) Weight:  [87.8 kg] 87.8 kg (06/10 0630)  Hemodynamic parameters for last 24 hours: CVP:  [7 mmHg-9 mmHg] 7 mmHg  Intake/Output from previous day: 06/09 0701 - 06/10 0700 In: 1996.5 [P.O.:240; I.V.:1479.9; IV Piggyback:276.6] Out: 2500 [Urine:2450; Drains:50] Intake/Output this shift: Total I/O In: 1500 [I.V.:1000; IV Piggyback:500] Out: 900 [Urine:750; Blood:150]  Responsive but still sedated Comfortable with PCA pump LVAD hum  Lab Results: Recent Labs    04/27/20 0600 04/27/20 1429  WBC 8.2 19.2*  HGB 10.4* 9.2*  HCT 33.0* 28.8*  PLT 329 317   BMET:  Recent Labs    04/26/20 0647 04/27/20 0600  NA 140 141  K 4.1 3.9  CL 106 106  CO2 25 27  GLUCOSE 169* 138*  BUN 7* 6*  CREATININE 0.84 0.74  CALCIUM 9.2 9.3    PT/INR:  Recent Labs    04/27/20 0600  LABPROT 13.2  INR 1.0   ABG    Component Value Date/Time   PHART 7.478 (H) 04/26/2020 0523   HCO3 26.5 04/26/2020 0523   TCO2 27 04/19/2020 0832   ACIDBASEDEF 1.0 04/18/2020 1513   O2SAT 81.2 04/26/2020 0523   CBG (last 3)  Recent Labs    04/26/20 1646 04/26/20 2111 04/27/20 0615  GLUCAP 84 144* 131*     Assessment/Plan: S/P Procedure(s) (LRB): STERNAL WOUND DEBRIDEMENT (N/A) MUSCLE FLAP TO STERNUM (N/A) APPLICATION OF A-CELL OF CHEST (N/A) APPLICATION OF PREVENA INCISIONAL WOUND VAC (N/A) Hold Coumadin and start redosing tomorrow p.m. Hold heparin until a.m. tomorrow   LOS: 11 days    Theodore Welch 04/27/2020

## 2020-04-27 NOTE — Progress Notes (Signed)
Patient ID: BARTLOMIEJ JENKINSON, male   DOB: Jul 23, 1961, 59 y.o.   MRN: [id number] TCTS Evening Rounds:  Hemodynamically stable VAD parameters stable. sats 100% on RA. Drain output low.  Urine output good.  CBC    Component Value Date/Time   WBC 19.2 (H) 04/27/2020 1429   RBC 3.09 (L) 04/27/2020 1429   HGB 9.2 (L) 04/27/2020 1429   HCT 28.8 (L) 04/27/2020 1429   PLT 317 04/27/2020 1429   MCV 93.2 04/27/2020 1429   MCH 29.8 04/27/2020 1429   MCHC 31.9 04/27/2020 1429   RDW 13.8 04/27/2020 1429   LYMPHSABS 3.4 04/16/2020 0408   MONOABS 1.6 (H) 04/16/2020 0408   EOSABS 0.5 04/16/2020 0408   BASOSABS 0.1 04/16/2020 0408

## 2020-04-27 NOTE — Op Note (Signed)
Operative Report  Date of operation: 04/27/2020  Patient: Theodore Welch, MRN: [id number], 59 y.o. male.   Date of birth: [birth year]  Location: [facility] Main Operating Room Inpatient  Preoperative Diagnosis:  Abdominal / sternal wound with drive line exposure  Postoperative Diagnosis:  Same  Procedure:  1. Right pedicle Rectus Muscle flap to abdominal / sternal wound defect. 2. Placement of Acell sheet 8 x 13 cm to abdominal wound. 3. Excision and debridement of Drive Line site 2 x 2 cm skin and soft tissue with Acell sheet (2 x 2 cm) placement 4.  Placement of Prevena VAC to sternal wound  Surgeon:  Dr. Lyndee Leo Khyan Oats  Assistant:  Dr. Tharon Aquas Trigt,   Second Assistant:  Roetta Sessions, PA  Anesthesia:  General  EBL:  250cc  Drains:  2 number 68 blake round drains  Condition:  Stable  Complications: None  Procedure in Detail: Consent was confirmed.  The patient was taken to the operating room and placed on the operating room table.   Antibiotics were administered.  The patient was taken to the operating room and underwent general anesthesia.  A time out was called and all information was confirmed to be correct. SCD's were placed on the legs.  A Foley catheter was placed.  A pillow was placed under the knees. The patient was prepped and draped in the standard sterile fashion.   The borders of the right rectus muscle were marked with a marking pen.  1% lidocaine with epinephrine was injected in the lateral aspect for where the incision was going to be placed.  The #10 blade was used to make the incision that included a cutaneous portion of the flap.  The Bovie was then used to dissect down to the rectus fascia.  The tuberosity the lateral border of the rectus was located and incised.  Care was taken to include the fascia and soft tissue above the ellipse of skin and subcutaneous tissue that would go into the defect.  The rectus sheath was lifted off the muscle laterally and  superior to the cutaneous portion of the flap.  Dissection was done with the tissue scissors and the bipolar was used to obtain hemostasis.  For the larger vessels the clips were used.  The inferior portion of the rectus muscle was divided and the inferior epigastric vessels were clipped.  The Bovie was used to obtain hemostasis on the muscle fibers.  The superior portion of the rectus fascia was divided in order to flip the muscle superiorly into the defect.  This was tunneled under a bridge of skin between the sternal wound and the abdominal wound.  Once the flap was in place the cutaneous portion of the flap was too thick and bulky.  Care was taken to make sure the flap was not twisted.  For this reason the skin and fat was excised.  It was still excellent blood flow to the muscle.  It appeared very healthy.  Undermining was performed for 5 cm in the lateral direction of the chest in order to help provide a tension-free closure.  The rectus muscle was loosely tacked in place to the sternal structure.  This was to help prevent it from dropping down and inferiorly.  Hemablast was utilized to help obtain hemostasis.  Sponge was applied and pressure held for 3 minutes.  The sponge was then removed.   A drain was placed and brought out through the skin laterally on the left side.  The drain was  tacked in place with a 3-0 silk.  Dr. Maren Beach assisted throughout and provided closure to the sternal wound.  This was done with combination of 2-0 Monocryl followed by 4-0 Monocryl.  The Prevena VAC was applied to the sternal wound.  There was excellent suction at 125 mmHg pressure.  The rectus fascia was brought together to provide closure to the abdominal wall above the arcuate line.  This was done with interrupted 1-0 Ethibond sutures using a figure-of-eight stitch.  The rectus fascia inferior to the arcuate line was able to be closed primarily.  There was some tension.  For this reason the 6 layer ACell 10 x 15 sheet  was utilized.  The stenosis was achieved with electrocautery and the Hemablast was also applied using the manufacture guidelines.  The sheet was cut in half in the long direction.  The 1-0 Ethibond suture was used with figure-of-eight stitches to close the fascia.  The ACell sheet was then used as an onlay.  The sheet was tacked in place with 5-0 Vicryl.  The drain was placed superficial to the ACell. It was brought out from the right lower abdomen.  It was secured with a 3-0 silk. The abdominal wall was closed with buried 2-0 and 3-0 Monocryl.  The subcuticular layer was closed with the 4-0 Monocryl followed by the 5-0 Monocryl.    Dermabond was applied.  A sterile dressing was applied.  The driveline site was debrided with a #15 blade.  This included a 2 x 2 centimeter area of skin and soft tissue.  Irrigation was used to clean the wound.  The 2 x 2 centimeter piece of ACell sheet was applied and tacked in place with a 5-0 Vicryl.  This was covered with Mepilex followed by KY gel and 4 x 4 gauze. Patient was allowed to wake up and was taken to the recovery room. The family was notified at the end of the case.   Dr. Donata Clay assisted throughout the case.  He was essential in retraction and counter traction when needed to make the case progress smoothly.  This retraction and assistance made it possible to see the tissue plans for the procedure.  The assistance was needed for blood control, tissue re-approximation and assisted with closure of the incision site.

## 2020-04-27 NOTE — Anesthesia Postprocedure Evaluation (Signed)
Anesthesia Post Note  Patient: Theodore Welch  Procedure(s) Performed: STERNAL WOUND DEBRIDEMENT (N/A ) MUSCLE FLAP TO STERNUM (N/A ) APPLICATION OF A-CELL OF CHEST (N/A ) APPLICATION OF PREVENA INCISIONAL WOUND VAC (N/A Abdomen)     Patient location during evaluation: SICU Anesthesia Type: General Level of consciousness: awake Pain management: pain level controlled Vital Signs Assessment: post-procedure vital signs reviewed and stable Respiratory status: spontaneous breathing Cardiovascular status: stable Postop Assessment: no apparent nausea or vomiting Anesthetic complications: no   No complications documented.  Last Vitals:  Vitals:   04/26/20 2355 04/27/20 0331  BP: 93/83 93/63  Pulse: 86 80  Resp: (!) 22 20  Temp: 37.1 C 36.9 C  SpO2: 98% 100%    Last Pain:  Vitals:   04/27/20 1336  TempSrc:   PainSc: Asleep                 Shelton Silvas

## 2020-04-27 NOTE — TOC Benefit Eligibility Note (Addendum)
Transition of Care [facility]) Benefit Eligibility Note    Patient Details  Name: Theodore Welch MRN: [id number] Date of Birth: Apr 22, 1961   Medication/Dose: Rifabutin 150 mg bid (only comes in '150mg'$ . not '300mg'$ )  Covered?: Yes  Tier:  (4)  Prescription Coverage Preferred Pharmacy: [facility]  Spoke with Person/Company/Phone Number:: Per Luberta Mutter.W[employer] PH# [phone]  Co-Pay: $1.50  Prior Approval: No  Deductible: Met  Additional Notes: [place]- $4.00 FOR [place] $1.30    Theodore Welch Phone Number: 04/27/2020, 3:24 PM

## 2020-04-27 NOTE — Progress Notes (Signed)
Patient ID: Theodore Welch, male   DOB: [birth year], 59 y.o.   MRN: [id number] P  Advanced Heart Failure VAD Team Note  PCP-Cardiologist: No primary care provider on file.   Subjective:    Here w/ subxiphoid abscess and cellulitis + bleeding from wound site. Admit Hgb 7.4. COVID +.   Speed increased back to 5700.   S/p multiple transfusions. Has received total of 3 units PRBCs and 5 units FFP. Hgb stabilized.   Blood cultures 1/2 positive for MSSA.  On cefazolin and rifabutin added .    Went to OR 6/1 for I&D of chest wound + repair of VAD outflow graft. Required fem-fem cardiopulmonary bypass. Wound vac placed. Back to OR 6/4 for debridement and wound vac change.   Back to OR 6/10 for rectus muscle flap over wound and wound VAC.   Repeat COVID-19 test negative 6/5.   Back from OR now, drowsy.  MAP on cuff low but nurse dopplered MAP 86.  LVAD INTERROGATION:  HeartMate III LVAD:   Flow 3.8  liters/min, speed 5700, power 4.1 PI 7. PI events today around time of surgery  Objective:    Vital Signs:   Temp:  [98.1 F (36.7 C)-98.8 F (37.1 C)] 98.4 F (36.9 C) (06/10 0331) Pulse Rate:  [80-101] 80 (06/10 0331) Resp:  [20-22] 20 (06/10 0331) BP: (93-109)/(63-92) 93/63 (06/10 0331) SpO2:  [98 %-100 %] 100 % (06/10 0331) Weight:  [87.8 kg] 87.8 kg (06/10 0630) Last BM Date: 04/24/20 Mean arterial Pressure 86  Intake/Output:   Intake/Output Summary (Last 24 hours) at 04/27/2020 1311 Last data filed at 04/27/2020 1300 Gross per 24 hour  Intake 3442.08 ml  Output 3400 ml  Net 42.08 ml     Physical Exam    General: Drowsy. NAD.  HEENT: Normal. Neck: Supple, JVP 7-8 cm. Carotids OK.  Cardiac:  Mechanical heart sounds with LVAD hum present.  Lungs:  CTAB, normal effort.  Abdomen:  NT, ND, no HSM. No bruits or masses. +BS  LVAD exit site: Wound vac over sternal wound.  Driveline site w/o drainage. Extremities:  Warm and dry. No cyanosis, clubbing, rash, or edema.  Neuro:   Alert & oriented x 3. Cranial nerves grossly intact. Moves all 4 extremities w/o difficulty. Affect pleasant     Telemetry  NSR   EKG   n/a   Labs   Basic Metabolic Panel: Recent Labs  Lab 04/23/20 0348 04/23/20 0348 04/24/20 0455 04/24/20 0455 04/25/20 0540 04/26/20 0647 04/27/20 0600  NA 143  --  143  --  142 140 141  K 3.9  --  4.0  --  4.0 4.1 3.9  CL 107  --  109  --  107 106 106  CO2 25  --  27  --  27 25 27   GLUCOSE 127*  --  142*  --  131* 169* 138*  BUN 11  --  10  --  5* 7* 6*  CREATININE 1.00  --  0.84  --  0.72 0.84 0.74  CALCIUM 8.8*   < > 8.8*   < > 9.0 9.2 9.3   < > = values in this interval not displayed.    Liver Function Tests: Recent Labs  Lab 04/26/20 0647  AST 15  ALT 10  ALKPHOS 76  BILITOT 0.6  PROT 6.0*  ALBUMIN 2.9*   No results for input(s): LIPASE, AMYLASE in the last 168 hours. No results for input(s): AMMONIA in the last 168 hours.  CBC: Recent Labs  Lab 04/23/20 0348 04/24/20 0455 04/25/20 0540 04/26/20 0647 04/27/20 0600  WBC 9.8 9.4 8.7 9.0 8.2  HGB 10.3* 10.2* 10.1* 10.3* 10.4*  HCT 31.5* 31.3* 31.3* 32.2* 33.0*  MCV 92.1 92.6 92.9 92.0 92.7  PLT 238 270 292 326 329    INR: Recent Labs  Lab 04/23/20 0348 04/24/20 0455 04/25/20 0540 04/26/20 0647 04/27/20 0600  INR 1.0 1.0 1.0 1.0 1.0    Other results:  EKG:    Imaging   DG Chest Port 1 View  Result Date: 04/26/2020 CLINICAL DATA:  LVAD EXAM: PORTABLE CHEST 1 VIEW COMPARISON:  Portable exam 0617 hours compared to 04/20/2020 FINDINGS: LVAD projects over cardiac apex. Enlargement of cardiac silhouette post median sternotomy. Mediastinal contours and pulmonary vascularity normal. Lungs clear. No infiltrate, pleural effusion, or pneumothorax. Osseous structures unremarkable. IMPRESSION: Enlargement of cardiac silhouette post LVAD. No acute abnormalities. Electronically Signed   By: Ulyses Southward M.D.   On: 04/26/2020 09:06   VAS [name] LOWER EXTREMITY VENOUS  (DVT)  Result Date: 04/26/2020  Lower Venous DVTStudy Indications: Edema, and Post-op.  Comparison Study: 07/22/18 DVT right pero Performing Technologist: Jeb Levering RDMS, RVT  Examination Guidelines: A complete evaluation includes B-mode imaging, spectral Doppler, color Doppler, and power Doppler as needed of all accessible portions of each vessel. Bilateral testing is considered an integral part of a complete examination. Limited examinations for reoccurring indications may be performed as noted. The reflux portion of the exam is performed with the patient in reverse Trendelenburg.  +---------+---------------+---------+-----------+----------+--------------+ RIGHT    CompressibilityPhasicitySpontaneityPropertiesThrombus Aging +---------+---------------+---------+-----------+----------+--------------+ CFV      Full           Yes      Yes                                 +---------+---------------+---------+-----------+----------+--------------+ SFJ      Full                                                        +---------+---------------+---------+-----------+----------+--------------+ FV Prox  Full                                                        +---------+---------------+---------+-----------+----------+--------------+ FV Mid   Full                                                        +---------+---------------+---------+-----------+----------+--------------+ FV DistalFull                                                        +---------+---------------+---------+-----------+----------+--------------+ PFV      Full                                                        +---------+---------------+---------+-----------+----------+--------------+  POP      Full           Yes      Yes                                 +---------+---------------+---------+-----------+----------+--------------+ PTV      Full                                                         +---------+---------------+---------+-----------+----------+--------------+ PERO     Full                                                        +---------+---------------+---------+-----------+----------+--------------+   +---------+---------------+---------+-----------+----------+--------------+ LEFT     CompressibilityPhasicitySpontaneityPropertiesThrombus Aging +---------+---------------+---------+-----------+----------+--------------+ CFV      Full           Yes      Yes                                 +---------+---------------+---------+-----------+----------+--------------+ SFJ      Full                                                        +---------+---------------+---------+-----------+----------+--------------+ FV Prox  Full                                                        +---------+---------------+---------+-----------+----------+--------------+ FV Mid   Full                                                        +---------+---------------+---------+-----------+----------+--------------+ FV DistalFull                                                        +---------+---------------+---------+-----------+----------+--------------+ PFV      Full                                                        +---------+---------------+---------+-----------+----------+--------------+ POP      Full           Yes      Yes                                 +---------+---------------+---------+-----------+----------+--------------+  PTV      Full                                                        +---------+---------------+---------+-----------+----------+--------------+ PERO     Full                                                        +---------+---------------+---------+-----------+----------+--------------+     Summary: RIGHT: - There is no evidence of deep vein thrombosis in the lower extremity.  - No cystic structure  found in the popliteal fossa.  LEFT: - There is no evidence of deep vein thrombosis in the lower extremity.  - No cystic structure found in the popliteal fossa. - Ultrasound characteristics of enlarged lymph nodes noted in the groin.  *See table(s) above for measurements and observations. Electronically signed by Monica Martinez MD on 04/26/2020 at 3:55:50 PM.    Final    [name] EKG SITE RITE  Result Date: 04/26/2020 If Site Rite image not attached, placement could not be confirmed due to current cardiac rhythm.    Medications:     Scheduled Medications: . sodium chloride   Intravenous Once  . acetaminophen  1,000 mg Oral Q6H   Or  . acetaminophen (TYLENOL) oral liquid 160 mg/5 mL  1,000 mg Oral Q6H  . amLODipine  10 mg Oral Daily  . aspirin  81 mg Oral Daily  . bisacodyl  10 mg Oral Daily  . fentaNYL   Intravenous Q4H  . gabapentin  600 mg Oral QID  . hydrALAZINE  50 mg Oral Q8H  . insulin aspart  0-24 Units Subcutaneous TID AC & HS  . insulin aspart  0-5 Units Subcutaneous QHS  . mupirocin cream   Topical BID  . pantoprazole  40 mg Oral Daily  . rifabutin  300 mg Oral Daily  . sacubitril-valsartan  1 tablet Oral BID  . senna-docusate  1 tablet Oral QHS  . sodium chloride flush  10-40 mL Intracatheter Q12H  . spironolactone  25 mg Oral Daily  . thiamine  100 mg Oral Daily  . varenicline  1 mg Oral Daily  . vitamin B-12  1,000 mcg Oral Daily  . zinc sulfate  220 mg Oral BID    Infusions: . sodium chloride    .  ceFAZolin (ANCEF) IV 2 g (04/26/20 2146)  . desmopressin (DDAVP) IV    . dextrose 5% lactated ringers    . dextrose 5 % and 0.45% NaCl 50 mL/hr at 04/25/20 0309  . heparin Stopped (04/27/20 1517)  . norepinephrine (LEVOPHED) Adult infusion Stopped (04/19/20 1110)    PRN Medications: Place/Maintain arterial line **AND** sodium chloride, acetaminophen, bisacodyl, diphenhydrAMINE **OR** diphenhydrAMINE, fentaNYL (SUBLIMAZE) injection, fluticasone, hydrALAZINE,  Ipratropium-Albuterol, naloxone **AND** sodium chloride flush, ondansetron (ZOFRAN) IV, ondansetron (ZOFRAN) IV, oxyCODONE, oxyCODONE, sodium chloride flush, sodium chloride flush, traMADol, zolpidem   Assessment/Plan:    1. 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 He previously required debridement of lower sternalwound with woundVAC therapy and IV antibiotics.This was completed with 3 surgical procedures including debridement and ACell application in March 6160.  More recently, the  lower sternal wound reopened with drainage again culturing MSSA and he had been on cephalexin + doxycycline.  The day of admission, he developed persistent bleeding from the wound site and came to ER, bleeding now stopped with suture.  Bleeding again 5/30, now stopped again. Got an additional 1 units PRBCs on 5/31 and again 6/1. Overall he has received 3 units PRBCs + 5 units FFP.  ID consulted and switched antibiotics to cefazolin.  1/2 blood cultures + for MSSA. CT chest/abd/pelvis--> Stable small amount of soft tissue tracking along the proximal drive line in the subcutaneous ventral left abdominal wall.  S/p I&D + wound vac placement on 6/1.   Back to OR for debridement and wound vac change on 6/4.  Back to OR for rectus muscle flap to cover sternal wound + wound vac on 6/10.  - Continue cefazolin IV + rifabutin . ID looking into cost of rifabutin.  - Resume low dose heparin gtt and warfarin when ok with surgery.  2. COVID-19 infection: Asymptomatic. No cough/dyspnea.  Repeat COVID-19 test negative 6/5. - He is off isolation.    3. 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 LVAD parameters stable.  Has been NYHA class II.  MAPs low initially, started on phenylephrine (may have been at least partly vagal as very diaphoretic with bleeding).  Speed back up to 5700 rpm. Nurse dopplered 86 for MAP though cuff pressure low  post-OR.  - Hold BP-active meds this afternoon.  4. Smoking:He has cut back a lot, still taking Chantix. Encouraged to quit. 5. H/o RLE DVT: On warfarin for LVAD but held for wound exploration 6. OSA: Continue CPAP.  7. Hyperlipidemia: Atorvastatin.  8. Type II diabetes:SSI 9. Anemia, Acute blood loss: Hgb has stabilized.   I reviewed the LVAD parameters from today, and compared the results to the patient's prior recorded data.  No programming changes were made.  The LVAD is functioning within specified parameters.  The patient performs LVAD self-test daily.  LVAD interrogation was negative for any significant power changes, alarms or PI events/speed drops.  LVAD equipment check completed and is in good working order.  Back-up equipment present.   LVAD education done on emergency procedures and precautions and reviewed exit site care.  Length of Stay: 11  Marca Ancona NP-C  04/27/2020 1:11 PM  VAD Team --- VAD ISSUES ONLY--- Pager [phone] (7am - 7am)  Advanced Heart Failure Team  Pager [phone] (M-F; 7a - 4p)  Please contact [facility] for night-coverage after hours (4p -7a ) and weekends on [email or web address]

## 2020-04-27 NOTE — Progress Notes (Signed)
Pre Procedure note for inpatients:   Theodore Welch has been scheduled for Procedure(s): STERNAL WOUND DEBRIDEMENT (N/A) POSSIBLE MUSCLE FLAP TO STERNUM (N/A) APPLICATION OF A-CELL OF CHEST (N/A) APPLICATION OF WOUND VAC (N/A) today. The various methods of treatment have been discussed with the patient. After consideration of the risks, benefits and treatment options the patient has consented to the planned procedure.   The patient has been seen and labs reviewed. There are no changes in the patient's condition to prevent proceeding with the planned procedure today.  Recent labs:  Lab Results  Component Value Date   WBC 8.2 04/27/2020   HGB 10.4 (L) 04/27/2020   HCT 33.0 (L) 04/27/2020   PLT 329 04/27/2020   GLUCOSE 138 (H) 04/27/2020   CHOL 97 07/21/2018   TRIG 39 07/21/2018   HDL 32 (L) 07/21/2018   LDLCALC 57 07/21/2018   ALT 10 04/26/2020   AST 15 04/26/2020   NA 141 04/27/2020   K 3.9 04/27/2020   CL 106 04/27/2020   CREATININE 0.74 04/27/2020   BUN 6 (L) 04/27/2020   CO2 27 04/27/2020   TSH 0.880 07/24/2018   INR 1.0 04/27/2020   HGBA1C 6.2 (H) 04/16/2020    [name], MD 04/27/2020 7:59 AM

## 2020-04-27 NOTE — Anesthesia Preprocedure Evaluation (Addendum)
Anesthesia Evaluation  Patient identified by MRN, date of birth, ID band Patient awake    Reviewed: Allergy & Precautions, NPO status , Patient's Chart, lab work & pertinent test results  Airway Mallampati: II  TM Distance: >3 FB Neck ROM: Full    Dental  (+) Teeth Intact, Dental Advisory Given   Pulmonary COPD, Current Smoker,    breath sounds clear to auscultation       Cardiovascular hypertension, +CHF   Rhythm:Regular Rate:Normal     Neuro/Psych  Neuromuscular disease negative psych ROS   GI/Hepatic Neg liver ROS, PUD,   Endo/Other  diabetes  Renal/GU negative Renal ROS     Musculoskeletal negative musculoskeletal ROS (+)   Abdominal Normal abdominal exam  (+)   Peds  Hematology negative hematology ROS (+)   Anesthesia Other Findings   Reproductive/Obstetrics                            Anesthesia Physical Anesthesia Plan  ASA: IV  Anesthesia Plan: General   Post-op Pain Management:    Induction: Intravenous  PONV Risk Score and Plan: 2 and Ondansetron and Midazolam  Airway Management Planned: Oral ETT  Additional Equipment: None  Intra-op Plan:   Post-operative Plan: Extubation in OR  Informed Consent: I have reviewed the patients History and Physical, chart, labs and discussed the procedure including the risks, benefits and alternatives for the proposed anesthesia with the patient or authorized representative who has indicated his/her understanding and acceptance.     Dental advisory given  Plan Discussed with: CRNA  Anesthesia Plan Comments:        Anesthesia Quick Evaluation

## 2020-04-27 NOTE — Interval H&P Note (Signed)
History and Physical Interval Note:  04/27/2020 8:10 AM  Theodore Welch  has presented today for surgery, with the diagnosis of STERNAL WOUND INFECTION AND VAD SITE.  The various methods of treatment have been discussed with the patient and family. After consideration of risks, benefits and other options for treatment, the patient has consented to  Procedure(s): STERNAL WOUND DEBRIDEMENT (N/A) POSSIBLE MUSCLE FLAP TO STERNUM (N/A) APPLICATION OF A-CELL OF CHEST (N/A) APPLICATION OF WOUND VAC (N/A) as a surgical intervention.  The patient's history has been reviewed, patient examined, no change in status, stable for surgery.  I have reviewed the patient's chart and labs.  Questions were answered to the patient's satisfaction.     Alena Bills [name]

## 2020-04-27 NOTE — Transfer of Care (Signed)
Immediate Anesthesia Transfer of Care Note  Patient: Theodore Welch  Procedure(s) Performed: STERNAL WOUND DEBRIDEMENT (N/A ) MUSCLE FLAP TO STERNUM (N/A ) APPLICATION OF A-CELL OF CHEST (N/A ) APPLICATION OF PREVENA INCISIONAL WOUND VAC (N/A Abdomen)  Patient Location: ICU  Anesthesia Type:General  Level of Consciousness: awake, alert  and oriented  Airway & Oxygen Therapy: Patient Spontanous Breathing  Post-op Assessment: Report given to RN and Post -op Vital signs reviewed and stable  Post vital signs: Reviewed and stable  Last Vitals:  Vitals Value Taken Time  BP    Temp    Pulse    Resp    SpO2      Last Pain:  Vitals:   04/27/20 0331  TempSrc: Oral  PainSc: 0-No pain      Patients Stated Pain Goal: 0 (04/20/20 2200)  Complications: No complications documented.

## 2020-04-27 NOTE — Progress Notes (Signed)
VAD Coordinator Procedure Note:   VAD Coordinator met patient in holding room 79. Pt undergoing sternal wound debridement with possible muscle flat to sternum per Dr. Darcey Nora and Dr. Marla Roe.  Hemodynamics and VAD parameters monitored by myself and anesthesia throughout the procedure. Blood pressures were obtained with automatic cuff on left arm and correlated with Doppler modified systolic.     Time: Doppler Auto  BP Flow PI Power Speed  Pre-procedure:           07:30 94 94/77 (80) 4.6 2.9 4.5            Secation Induction:          08:15  84/72 (78) 3.5 4.2 4.4    08:30  86/70 (76) 4.9 3.3 4.3    08:45  78/52 (60) 4.8 2.9 4.3    09:00  117/81 (94) 4.5 3.1 4.5    09:15  95/66 (74) 4.9 3.1 4.4    09:30  100/83 (90) 4.8 3.0 4.6    09:45  117/93 (102) 4.5 3.4 4.5    10:00  131/103 (115) 4.2 5.2 4.5    10:15  91/73 (80) 4.3 4.8 4.4    10:30  88/72 (79) 4.7 3.2 4.3    10:45  98/83 (90) 4.5 2.9 4.3    11:00  110/94 (101) 4.4 3.6 4.3    11:15  106/88 (96) 4.4 3.7 4.4    11:30  115/79 (89) 4.2 4.6 4.4    11:45  119/100 (108) 4.2 5.0 4.4    12:00  113/90 (98) 3.7 6.8 4.4    12:15  103/85 (94) 4.0 5.0 4.5                       Per Dr. Darcey Nora, daily dressing changes on drive line exit site starting Saturday. Orders updated.   Dr. Prescott Gum updated [place].  Patient Disposition: 2H12  Zada Girt RN, VAD Coordinator 24/7 VAD pager: [phone]

## 2020-04-27 NOTE — Brief Op Note (Signed)
04/16/2020 - 04/27/2020  12:06 PM  PATIENT:  Genevie Cheshire E Rottmann  59 y.o. male  PRE-OPERATIVE DIAGNOSIS:  STERNAL WOUND INFECTION AND VAD SITE  POST-OPERATIVE DIAGNOSIS:  STERNAL WOUND INFECTION AND VAD SITE  PROCEDURE:  Procedure(s): STERNAL WOUND DEBRIDEMENT (N/A) MUSCLE FLAP TO STERNUM (N/A) APPLICATION OF A-CELL OF CHEST (N/A) APPLICATION OF PREVENA INCISIONAL WOUND VAC (N/A)  SURGEON:  Surgeon(s) and Role: * Dillingham, Alena Bills, DO - Primary  PHYSICIAN ASSISTANT: Kathlee Nations trigt MD  ASSISTANTS: Matt PA-C  ANESTHESIA:   general  EBL:  150 mL   BLOOD ADMINISTERED:none  DRAINS: 2 blake drains to Bulb suction   LOCAL MEDICATIONS USED:  NONE  SPECIMEN:  No Specimen  DISPOSITION OF SPECIMEN:  N/A  COUNTS:  YES  TOURNIQUET:  * No tourniquets in log *  DICTATION: .Dragon Dictation  PLAN OF CARE: transfer to 2H  PATIENT DISPOSITION:  PACU - hemodynamically stable.   Delay start of Pharmacological VTE agent (>24hrs) due to surgical blood loss or risk of bleeding: yes Start heparin in am afer rounds

## 2020-04-27 NOTE — Progress Notes (Signed)
ANTICOAGULATION CONSULT NOTE   Pharmacy Consult for heparin - per TCTS dosing Indication: LVAD  Allergies  Allergen Reactions  . Chlorhexidine Rash  . Other Rash    Prep pads    Patient Measurements: Height: 5\' 11"  (180.3 cm) Weight: 87.8 kg (193 lb 9 oz) IBW/kg (Calculated) : 75.3 Heparin Dosing Weight: ~ 90 kg  Vital Signs: Temp: 98.4 F (36.9 C) (06/10 0331) Temp Source: Oral (06/10 0331) BP: 93/63 (06/10 0331) Pulse Rate: 80 (06/10 0331)  Labs: Recent Labs    04/25/20 0540 04/25/20 0540 04/26/20 0647 04/27/20 0600  HGB 10.1*   < > 10.3* 10.4*  HCT 31.3*  --  32.2* 33.0*  PLT 292  --  326 329  LABPROT 12.4  --  12.9 13.2  INR 1.0  --  1.0 1.0  HEPARINUNFRC <0.10*  --  <0.10* <0.10*  CREATININE 0.72  --  0.84 0.74   < > = values in this interval not displayed.    Estimated Creatinine Clearance: 103.3 mL/min (by C-G formula based on SCr of 0.74 mg/dL).   Medical History: Past Medical History:  Diagnosis Date  . 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 Cardiomyopathy, unspecified (HCC)   . CHF (congestive heart failure) (HCC)   . Chronic back pain   . Diabetes mellitus without complication (HCC)   . Enlarged heart   . Gastric ulcer   . Gastroenteritis   . H/O degenerative disc disease   . Hypertension   . LVAD (left ventricular assist device) present (HCC)     Medications:   Infusions:  . sodium chloride    .  ceFAZolin (ANCEF) IV 2 g (04/26/20 2146)  . desmopressin (DDAVP) IV    . dextrose 5% lactated ringers    . dextrose 5 % and 0.45% NaCl 50 mL/hr at 04/25/20 0309  . heparin Stopped (04/27/20 06/27/20)  . norepinephrine (LEVOPHED) Adult infusion Stopped (04/19/20 1110)    Assessment: 59 yo male with LVAD - HM3 + ASA 81mg  daily, MSSA bacteremia and  driveline infection.  Chronic Coumadin on hold for procedures - plan flap 6/10.  Heparin started 6/3 at low dose.  Heparin level this morning prior to OR remains undetectable on 900 units/hr. - No  titrations per MD for now. Hgb 10.4, plt 329. LDH 168. No overt bleeding or complications noted.  Underwent sternal wound debridement with flap and wound VAC application on 6/10 - discussed with Dr and will stay off heparin tonight, plan to reassess restart in AM.   Goal of Therapy:  Monitor platelets by anticoagulation protocol: Yes   Plan:  No heparin infusion overnight - will discuss restart in AM Follow up post procedure completion to restart PTA warfarin  8/10, PharmD, BCCCP Clinical Pharmacist  Phone: [phone] 04/27/2020 1:58 PM  Please check AMION for all [facility] Pharmacy phone numbers After 10:00 PM, call Main Pharmacy [phone]

## 2020-04-27 NOTE — Care Management (Signed)
Benefits check placed with the TOC cma's to find cost for Rifabutin 300 mg by mouth once a day for 30 day supply.

## 2020-04-28 ENCOUNTER — Encounter (HOSPITAL_COMMUNITY): Payer: Self-pay | Admitting: Cardiothoracic Surgery

## 2020-04-28 LAB — BASIC METABOLIC PANEL
Anion gap: 6 (ref 5–15)
BUN: 8 mg/dL (ref 8–23)
CO2: 25 mmol/L (ref 22–32)
Calcium: 9.1 mg/dL (ref 8.9–10.3)
Chloride: 106 mmol/L (ref 98–111)
Creatinine, Ser: 0.82 mg/dL (ref 0.61–1.24)
GFR calc Af Amer: >60 mL/min (ref >60)
GFR calc non Af Amer: >60 mL/min (ref >60)
Glucose, Bld: 127 mg/dL — ABNORMAL HIGH (ref 70–99)
Potassium: 3.9 mmol/L (ref 3.5–5.1)
Sodium: 137 mmol/L (ref 135–145)

## 2020-04-28 LAB — CBC
HCT: 26.7 % — ABNORMAL LOW (ref 39.0–52.0)
HCT: 24.1 % — ABNORMAL LOW (ref 39.0–52.0)
Hemoglobin: 8.4 g/dL — ABNORMAL LOW (ref 13.0–17.0)
Hemoglobin: 7.6 g/dL — ABNORMAL LOW (ref 13.0–17.0)
MCH: 29.6 pg (ref 26.0–34.0)
MCH: 29.3 pg (ref 26.0–34.0)
MCHC: 31.5 g/dL (ref 30.0–36.0)
MCHC: 31.5 g/dL (ref 30.0–36.0)
MCV: 94 fL (ref 80.0–100.0)
MCV: 93.1 fL (ref 80.0–100.0)
Platelets: 271 10*3/uL (ref 150–400)
Platelets: 245 10*3/uL (ref 150–400)
RBC: 2.84 MIL/uL — ABNORMAL LOW (ref 4.22–5.81)
RBC: 2.59 MIL/uL — ABNORMAL LOW (ref 4.22–5.81)
RDW: 13.9 % (ref 11.5–15.5)
RDW: 13.8 % (ref 11.5–15.5)
WBC: 13.3 10*3/uL — ABNORMAL HIGH (ref 4.0–10.5)
WBC: 13.7 10*3/uL — ABNORMAL HIGH (ref 4.0–10.5)
nRBC: 0 % (ref 0.0–0.2)
nRBC: 0 % (ref 0.0–0.2)

## 2020-04-28 LAB — BPAM FFP
Blood Product Expiration Date: 202106102359
Blood Product Expiration Date: 202106112359
ISSUE DATE / TIME: 202106101407
ISSUE DATE / TIME: 202106101407
Unit Type and Rh: 5100
Unit Type and Rh: 6200

## 2020-04-28 LAB — HEPARIN LEVEL (UNFRACTIONATED): Heparin Unfractionated: <0.1 IU/mL — ABNORMAL LOW (ref 0.30–0.70)

## 2020-04-28 LAB — GLUCOSE, CAPILLARY
Glucose-Capillary: 124 mg/dL — ABNORMAL HIGH (ref 70–99)
Glucose-Capillary: 169 mg/dL — ABNORMAL HIGH (ref 70–99)
Glucose-Capillary: 101 mg/dL — ABNORMAL HIGH (ref 70–99)
Glucose-Capillary: 118 mg/dL — ABNORMAL HIGH (ref 70–99)

## 2020-04-28 LAB — PREPARE FRESH FROZEN PLASMA
Unit division: 0
Unit division: 0

## 2020-04-28 LAB — LACTATE DEHYDROGENASE: LDH: 153 U/L (ref 98–192)

## 2020-04-28 LAB — PROTIME-INR
INR: 1.2 (ref 0.8–1.2)
Prothrombin Time: 14.3 seconds (ref 11.4–15.2)

## 2020-04-28 MED ORDER — WARFARIN SODIUM 2.5 MG PO TABS
2.5000 mg | ORAL_TABLET | Freq: Once | ORAL | Status: AC
  Administered 2020-04-28: 2.5 mg via ORAL
  Filled 2020-04-28: qty 1

## 2020-04-28 MED ORDER — SODIUM CHLORIDE 0.9 % IV SOLN: INTRAVENOUS | Status: DC | PRN

## 2020-04-28 MED ORDER — SODIUM CHLORIDE 0.9 % IV BOLUS
500.0000 mL | Freq: Once | INTRAVENOUS | Status: AC
  Administered 2020-04-28: 500 mL via INTRAVENOUS

## 2020-04-28 MED ORDER — SODIUM CHLORIDE 0.9 % IV SOLN
INTRAVENOUS | Status: DC | PRN
  Administered 2020-04-28: 500 mL via INTRAVENOUS

## 2020-04-28 MED ORDER — WARFARIN - PHYSICIAN DOSING INPATIENT: Freq: Every day | Status: DC

## 2020-04-28 MED ORDER — HEPARIN (PORCINE) 25000 UT/250ML-% IV SOLN
1200.0000 [IU]/h | INTRAVENOUS | Status: DC
  Administered 2020-04-28: 500 [IU]/h via INTRAVENOUS
  Administered 2020-05-01: 700 [IU]/h via INTRAVENOUS
  Administered 2020-05-03: 800 [IU]/h via INTRAVENOUS
  Administered 2020-05-04: 1200 [IU]/h via INTRAVENOUS
  Filled 2020-04-28 (×6): qty 250

## 2020-04-28 NOTE — Progress Notes (Signed)
1 Day Post-Op Procedure(s) (LRB): STERNAL WOUND DEBRIDEMENT (N/A) MUSCLE FLAP TO STERNUM (N/A) APPLICATION OF A-CELL OF CHEST (N/A) APPLICATION OF PREVENA INCISIONAL WOUND VAC (N/A) Subjective: Pain fairly well controlled with PCA Abdomen moderately distended with positive flatus but no bowel movement Jackson-Pratt drains with decreasing serosanguineous output Hemoglobin dropped to 8.2 from 10.0 Low-dose heparin resumed with slow Coumadin reload plan VAD parameters satisfactory  Objective: Vital signs in last 24 hours: Temp:  [98 F (36.7 C)-99.6 F (37.6 C)] 99.2 F (37.3 C) (06/11 0400) Pulse Rate:  [35-96] 81 (06/11 1000) Cardiac Rhythm: Normal sinus rhythm (06/11 0831) Resp:  [18-29] 27 (06/11 1000) BP: (55-113)/(46-88) 104/84 (06/11 1000) SpO2:  [92 %-100 %] 100 % (06/11 1000) Weight:  [88.2 kg] 88.2 kg (06/11 0500)  Hemodynamic parameters for last 24 hours: CVP:  [3 mmHg-14 mmHg] 14 mmHg  Intake/Output from previous day: 06/10 0701 - 06/11 0700 In: 3783.9 [P.O.:100; I.V.:2194.1; Blood:641; IV Piggyback:848.8] Out: 2125 [Urine:1675; Drains:300; Blood:150] Intake/Output this shift: Total I/O In: 243.4 [P.O.:120; I.V.:23.4; IV Piggyback:100] Out: 25 [Drains:25] Exam Alert and appropriate Lungs clear Normal VAD hum Abdomen nontender  Lab Results: Recent Labs    04/27/20 1429 04/28/20 0400  WBC 19.2* 13.3*  HGB 9.2* 8.4*  HCT 28.8* 26.7*  PLT 317 271   BMET:  Recent Labs    04/27/20 0600 04/28/20 0400  NA 141 137  K 3.9 3.9  CL 106 106  CO2 27 25  GLUCOSE 138* 127*  BUN 6* 8  CREATININE 0.74 0.82  CALCIUM 9.3 9.1    PT/INR:  Recent Labs    04/28/20 0400  LABPROT 14.3  INR 1.2   ABG    Component Value Date/Time   PHART 7.478 (H) 04/26/2020 0523   HCO3 26.5 04/26/2020 0523   TCO2 27 04/19/2020 0832   ACIDBASEDEF 1.0 04/18/2020 1513   O2SAT 81.2 04/26/2020 0523   CBG (last 3)  Recent Labs    04/27/20 1807 04/27/20 2210  04/28/20 0641  GLUCAP 96 178* 124*    Assessment/Plan: S/P Procedure(s) (LRB): STERNAL WOUND DEBRIDEMENT (N/A) MUSCLE FLAP TO STERNUM (N/A) APPLICATION OF A-CELL OF CHEST (N/A) APPLICATION OF PREVENA INCISIONAL WOUND VAC (N/A) Follow daily CBC, start oral iron and folic acid for expected postoperative anemia   LOS: 12 days    [name] 04/28/2020

## 2020-04-28 NOTE — Progress Notes (Signed)
ANTICOAGULATION CONSULT NOTE   Pharmacy Consult for heparin - per TCTS dosing Indication: LVAD  Allergies  Allergen Reactions  . Chlorhexidine Rash  . Other Rash    Prep pads    Patient Measurements: Height: 5\' 11"  (180.3 cm) Weight: 88.2 kg (194 lb 7.1 oz) IBW/kg (Calculated) : 75.3 Heparin Dosing Weight: ~ 90 kg  Vital Signs: Temp: 99.2 F (37.3 C) (06/11 0400) Temp Source: Oral (06/11 0400) BP: 97/77 (06/11 0500) Pulse Rate: 81 (06/11 0500)  Labs: Recent Labs    04/26/20 0647 04/26/20 0647 04/27/20 0600 04/27/20 0600 04/27/20 1429 04/28/20 0400  HGB 10.3*   < > 10.4*   < > 9.2* 8.4*  HCT 32.2*   < > 33.0*  --  28.8* 26.7*  PLT 326   < > 329  --  317 271  LABPROT 12.9  --  13.2  --   --  14.3  INR 1.0  --  1.0  --   --  1.2  HEPARINUNFRC <0.10*  --  <0.10*  --   --  <0.10*  CREATININE 0.84  --  0.74  --   --  0.82   < > = values in this interval not displayed.    Estimated Creatinine Clearance: 100.8 mL/min (by C-G formula based on SCr of 0.82 mg/dL).   Medical History: Past Medical History:  Diagnosis Date  . 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 Cardiomyopathy, unspecified (HCC)   . CHF (congestive heart failure) (HCC)   . Chronic back pain   . Diabetes mellitus without complication (HCC)   . Enlarged heart   . Gastric ulcer   . Gastroenteritis   . H/O degenerative disc disease   . Hypertension   . LVAD (left ventricular assist device) present (HCC)     Medications:   Infusions:  . sodium chloride    .  ceFAZolin (ANCEF) IV 2 g (04/28/20 06/28/20)  . heparin    . norepinephrine (LEVOPHED) Adult infusion Stopped (04/19/20 1110)    Assessment: 59 yo male with LVAD - HM3 + ASA 81mg  daily, MSSA bacteremia and  driveline infection.  Chronic Coumadin on hold for procedures - plan flap 6/10.  Heparin started 6/3 at low dose.  Underwent sternal wound debridement with flap and wound VAC application on 6/10 - discussed with Dr - off heparin  overnight on 6/10. MD okay to start heparin infusion 500 units/hr and ordered warfarin 2.5 mg.   Goal of Therapy:  Monitor platelets by anticoagulation protocol: Yes   Plan:  Restarting heparin 500 units/hr  Will get AM labs (no titration)  Monitor daily INR  Donata Clay, PharmD, BCCCP Clinical Pharmacist  Phone: [phone] 04/28/2020 7:56 AM  Please check AMION for all [facility]acy phone numbers After 10:00 PM, call Main Pharmacy [phone]

## 2020-04-28 NOTE — Progress Notes (Signed)
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   HM III LVAD implanted on 08/03/18 by Dr. Laneta Simmers under Destination Therapy criteria due to smoking status.  Pt asleep in bed this morning. PCA Fentanyl available as needed. Wound vac in place. Minimal drainage noted. JP drain in place- charged.    Vital signs: Temp: 99.2 HR: 77 Doppler Pressure: 98 Auto cuff: 109/88 (95) O2 Sat: 100% on RA Wt: 197>189>189>203.7>208.7>207.6>200.8>200.6>200.6>193.5>194.4 lbs   LVAD interrogation reveals:  Speed: 5500 Flow: 4.3 Power: 4.3w PI: 4.1  Alarms: none Events: 10 PI so far today Hematocrit: 32  Fixed speed: 5700 Low speed limit: 5400  Drive Line: Daily dressing change per bedside RN starting 04/29/20. Clean with betadine, no skin prep (skin intolerance). Take down outer portion of dressing. Leave Mepitel dressing in place. Cover with sterile gel and saline moistened 4 x 4 and sterile VAD dressing.    Labs:  LDH trend: 182>194>140>252>191>162>160>168>159>168>153  INR trend: 2.5>1.9>1.5>1.4>1.3>1.2>1.1>1.0>1.0>1.0>1.0>1.2  Anticoagulation Plan: -INR Goal: 2.0 - 2.5 - warfarin on hold -ASA Dose: 81 mg daily - on hold  Blood Products:  - 04/16/20>FFP x 2; PRBC x 1 - 04/17/20>>FFP x 1; PRBC x 1  Intra-op: - 04/18/20>>PRBC x 6; Plts x 2; FFP x 2; Cell saver 2330 ml  Device: N/A  Respiratory: intubated intra op 04/18/20 - extubated 04/19/20 am   Infection:  - 04/16/20>>Covid positive - 04/16/20>>BCs staph aureus - 04/18/20>>sternal wound culture intra-op>>staph aureus - 04/20/20>>BCs pending - 04/22/20>> COVID negative  Gtts: - Heparin 900 units/hr   Plan/Recommendations:  1. Call VAD Coordinator if any VAD equipment or drive line issues. 2. Daily drive line dressing change per bedside RN   Alyce Pagan RN VAD Coordinator  Office: [phone]  24/7 Pager: [phone]

## 2020-04-28 NOTE — Progress Notes (Signed)
1 Day Post-Op  Subjective: Patient is status post rectus muscle advancement flap to cover sternal wound with exposed driveline wire on 04/27/2020 with Dr. Ulice Bold.  Patient is sitting up in bedside chair this a.m. eating breakfast.  He reports some vomiting yesterday.  He reports some soreness in his abdomen, is on pain pump.  No measurable fevers last night.  No flatus or BM yet.  Objective: Vital signs in last 24 hours: Temp:  [98 F (36.7 C)-99.6 F (37.6 C)] 99.2 F (37.3 C) (06/11 0400) Pulse Rate:  [35-96] 81 (06/11 0500) Resp:  [18-29] 26 (06/11 0830) BP: (55-113)/(46-82) 97/77 (06/11 0500) SpO2:  [92 %-100 %] 100 % (06/11 0830) Weight:  [88.2 kg] 88.2 kg (06/11 0500) Last BM Date: 04/24/20  Intake/Output from previous day: 06/10 0701 - 06/11 0700 In: 3783.9 [P.O.:100; I.V.:2194.1; Blood:641; IV Piggyback:848.8] Out: 2125 [Urine:1675; Drains:300; Blood:150] Intake/Output this shift: No intake/output data recorded.  General appearance: alert, cooperative, no distress and Sitting up in bedside chair eating breakfast Head: Normocephalic, without obvious abnormality, atraumatic Resp: Unlabored Chest wall: Some tenderness to palpation of chest, Prevena VAC in place with good seal noted.  No drainage in canister.  JP drain in place along left chest wall as well as right lower abdomen.  Drains recently emptied by nursing staff per patient, drainage is serosanguineous.  No fluid collections noted. Extremities: extremities normal, atraumatic, no cyanosis or edema Neurologic: Grossly normal  Lab Results:  CBC Latest Ref Rng & Units 04/28/2020 04/27/2020 04/27/2020  WBC 4.0 - 10.5 K/uL 13.3(H) 19.2(H) 8.2  Hemoglobin 13.0 - 17.0 g/dL 3.7(D) 4.2(A) 10.4(L)  Hematocrit 39 - 52 % 26.7(L) 28.8(L) 33.0(L)  Platelets 150 - 400 K/uL 271 317 329    BMET Recent Labs    04/27/20 0600 04/28/20 0400  NA 141 137  K 3.9 3.9  CL 106 106  CO2 27 25  GLUCOSE 138* 127*  BUN 6* 8   CREATININE 0.74 0.82  CALCIUM 9.3 9.1   PT/INR Recent Labs    04/27/20 0600 04/28/20 0400  LABPROT 13.2 14.3  INR 1.0 1.2   ABG Recent Labs    04/26/20 0523  PHART 7.478*  HCO3 26.5    Studies/Results: DG Chest Port 1 View  Result Date: 04/27/2020 CLINICAL DATA:  Evaluate LVAD EXAM: PORTABLE CHEST 1 VIEW COMPARISON:  04/26/2020 FINDINGS: LVAD again noted projecting over the cardiac apex. Prior median sternotomy. Interval placement of a right-sided PICC line with distal tip terminating at the level of the superior cavoatrial junction. Stable cardiomegaly. No focal airspace consolidation, pleural effusion, or pneumothorax. IMPRESSION: 1. Interval placement of right-sided PICC line with distal tip terminating at the level of the superior cavoatrial junction. 2. Stable LVAD. Electronically Signed   By: Duanne Guess D.O.   On: 04/27/2020 15:36   VAS [name] LOWER EXTREMITY VENOUS (DVT)  Result Date: 04/26/2020  Lower Venous DVTStudy Indications: Edema, and Post-op.  Comparison Study: 07/22/18 DVT right pero Performing Technologist: Jeb Levering RDMS, RVT  Examination Guidelines: A complete evaluation includes B-mode imaging, spectral Doppler, color Doppler, and power Doppler as needed of all accessible portions of each vessel. Bilateral testing is considered an integral part of a complete examination. Limited examinations for reoccurring indications may be performed as noted. The reflux portion of the exam is performed with the patient in reverse Trendelenburg.  +---------+---------------+---------+-----------+----------+--------------+ RIGHT    CompressibilityPhasicitySpontaneityPropertiesThrombus Aging +---------+---------------+---------+-----------+----------+--------------+ CFV      Full  Yes      Yes                                 +---------+---------------+---------+-----------+----------+--------------+ SFJ      Full                                                         +---------+---------------+---------+-----------+----------+--------------+ FV Prox  Full                                                        +---------+---------------+---------+-----------+----------+--------------+ FV Mid   Full                                                        +---------+---------------+---------+-----------+----------+--------------+ FV DistalFull                                                        +---------+---------------+---------+-----------+----------+--------------+ PFV      Full                                                        +---------+---------------+---------+-----------+----------+--------------+ POP      Full           Yes      Yes                                 +---------+---------------+---------+-----------+----------+--------------+ PTV      Full                                                        +---------+---------------+---------+-----------+----------+--------------+ PERO     Full                                                        +---------+---------------+---------+-----------+----------+--------------+   +---------+---------------+---------+-----------+----------+--------------+ LEFT     CompressibilityPhasicitySpontaneityPropertiesThrombus Aging +---------+---------------+---------+-----------+----------+--------------+ CFV      Full           Yes      Yes                                 +---------+---------------+---------+-----------+----------+--------------+ SFJ  Full                                                        +---------+---------------+---------+-----------+----------+--------------+ FV Prox  Full                                                        +---------+---------------+---------+-----------+----------+--------------+ FV Mid   Full                                                         +---------+---------------+---------+-----------+----------+--------------+ FV DistalFull                                                        +---------+---------------+---------+-----------+----------+--------------+ PFV      Full                                                        +---------+---------------+---------+-----------+----------+--------------+ POP      Full           Yes      Yes                                 +---------+---------------+---------+-----------+----------+--------------+ PTV      Full                                                        +---------+---------------+---------+-----------+----------+--------------+ PERO     Full                                                        +---------+---------------+---------+-----------+----------+--------------+     Summary: RIGHT: - There is no evidence of deep vein thrombosis in the lower extremity.  - No cystic structure found in the popliteal fossa.  LEFT: - There is no evidence of deep vein thrombosis in the lower extremity.  - No cystic structure found in the popliteal fossa. - Ultrasound characteristics of enlarged lymph nodes noted in the groin.  *See table(s) above for measurements and observations. Electronically signed by Sherald Hess MD on 04/26/2020 at 3:55:50 PM.    Final     Anti-infectives: Anti-infectives (From admission, onward)   Start     Dose/Rate Route Frequency Ordered Stop   04/27/20 0911  vancomycin (VANCOCIN) 1,000 mg  in sodium chloride 0.9 % 1,000 mL irrigation  Status:  Discontinued          As needed 04/27/20 0911 04/27/20 1230   04/27/20 0815  ceFAZolin (ANCEF) IVPB 2g/100 mL premix  Status:  Discontinued        2 g 200 mL/hr over 30 Minutes Intravenous On call to O.R. 04/27/20 0809 04/27/20 0812   04/27/20 0800  ceFAZolin (ANCEF) IVPB 2g/100 mL premix        2 g 200 mL/hr over 30 Minutes Intravenous To ShortStay Surgical 04/26/20 0827 04/27/20 0630    04/25/20 1530  rifabutin (MYCOBUTIN) capsule 300 mg     Discontinue     300 mg Oral Daily 04/25/20 1529     04/21/20 1506  vancomycin (VANCOCIN) 1,000 mg in sodium chloride 0.9 % 1,000 mL irrigation  Status:  Discontinued          As needed 04/21/20 1506 04/21/20 1649   04/21/20 1300  ceFAZolin (ANCEF) IVPB 2g/100 mL premix        2 g 200 mL/hr over 30 Minutes Intravenous To ShortStay Surgical 04/20/20 0834 04/21/20 1557   04/18/20 1400  vancomycin (VANCOREADY) IVPB 1500 mg/300 mL  Status:  Discontinued        1,500 mg 150 mL/hr over 120 Minutes Intravenous To Surgery 04/18/20 1348 04/18/20 1639   04/18/20 1127  vancomycin (VANCOCIN) 1,000 mg in sodium chloride 0.9 % 1,000 mL irrigation  Status:  Discontinued          As needed 04/18/20 1128 04/18/20 1631   04/16/20 1700  vancomycin (VANCOREADY) IVPB 1250 mg/250 mL  Status:  Discontinued        1,250 mg 166.7 mL/hr over 90 Minutes Intravenous Every 12 hours 04/16/20 0746 04/16/20 1241   04/16/20 1700  ceFAZolin (ANCEF) IVPB 2g/100 mL premix     Discontinue     2 g 200 mL/hr over 30 Minutes Intravenous Every 8 hours 04/16/20 1247     04/16/20 0600  piperacillin-tazobactam (ZOSYN) IVPB 3.375 g  Status:  Discontinued        3.375 g 12.5 mL/hr over 240 Minutes Intravenous Every 6 hours 04/16/20 0516 04/16/20 0535   04/16/20 0600  piperacillin-tazobactam (ZOSYN) IVPB 3.375 g  Status:  Discontinued        3.375 g 12.5 mL/hr over 240 Minutes Intravenous Every 8 hours 04/16/20 0536 04/16/20 1241   04/16/20 0530  vancomycin (VANCOCIN) IVPB 1000 mg/200 mL premix  Status:  Discontinued        1,000 mg 200 mL/hr over 60 Minutes Intravenous Every 12 hours 04/16/20 0516 04/16/20 0535   04/16/20 0430  vancomycin (VANCOREADY) IVPB 1750 mg/350 mL        1,750 mg 175 mL/hr over 120 Minutes Intravenous  Once 04/16/20 0429 04/16/20 0742      Assessment/Plan: s/p Procedure(s): STERNAL WOUND DEBRIDEMENT MUSCLE FLAP TO STERNUM APPLICATION OF A-CELL OF  CHEST APPLICATION OF PREVENA INCISIONAL WOUND VAC  Will leave Prevena wound VAC in place for a minimum of 1 week.   Continue to follow JP drain output. Total of 300 cc of output yesterday.  This a.m. 15 cc from chest drain and 10 cc from abdomen drain.     LOS: 12 days    Charlies Constable, PA-C 04/28/2020

## 2020-04-28 NOTE — Progress Notes (Addendum)
Patient ID: Theodore Welch, male   DOB: Jul 19, 1961, 59 y.o.   MRN: [id number] P  Advanced Heart Failure VAD Team Note  PCP-Cardiologist: No primary care provider on file.   Subjective:    Here w/ subxiphoid abscess and cellulitis + bleeding from wound site. Admit Hgb 7.4. COVID +.   Speed increased back to 5700.   S/p multiple transfusions. Has received total of 3 units PRBCs and 5 units FFP. Hgb stabilized.   Blood cultures 1/2 positive for MSSA.  On cefazolin and rifabutin added .    Went to OR 6/1 for I&D of chest wound + repair of VAD outflow graft. Required fem-fem cardiopulmonary bypass. Wound vac placed. Back to OR 6/4 for debridement and wound vac change.   Back to OR 6/10 for rectus muscle flap over wound and wound VAC.   Repeat COVID-19 test negative 6/5.   OOB sitting in chair. 8/10 post op pain. Using pain pump. No dyspnea. Cuff MAPs in the 80s.   LVAD INTERROGATION:  HeartMate III LVAD:   Flow 4.6  liters/min, speed 5750, power 4.2 PI 2.9 multiple PI events   Objective:    Vital Signs:   Temp:  [98 F (36.7 C)-99.6 F (37.6 C)] 99.2 F (37.3 C) (06/11 0400) Pulse Rate:  [35-96] 81 (06/11 0500) Resp:  [18-29] 27 (06/11 0500) BP: (55-113)/(46-82) 97/77 (06/11 0500) SpO2:  [92 %-100 %] 99 % (06/11 0500) Weight:  [88.2 kg] 88.2 kg (06/11 0500) Last BM Date: 04/24/20 Mean arterial Pressure 86  Intake/Output:   Intake/Output Summary (Last 24 hours) at 04/28/2020 0744 Last data filed at 04/28/2020 0600 Gross per 24 hour  Intake 3783.88 ml  Output 2125 ml  Net 1658.88 ml     Physical Exam    PHYSICAL EXAM: General:  Well appearing, OOB sitting up in chair. No respiratory difficulty HEENT: normal Neck: supple. no JVD. Carotids 2+ bilat; no bruits. No lymphadenopathy or thyromegaly appreciated. Cor: + LVAD HUM, + sternal wound vac w/ good seal  Lungs: clear Abdomen: mildly distended soft, nontender, nondistended. No hepatosplenomegaly. No bruits or masses.  Good bowel sounds. Dive-line Site: stable w/ clean/dry bandaging  Extremities: no cyanosis, clubbing, rash, edema Neuro: alert & oriented x 3, cranial nerves grossly intact. moves all 4 extremities w/o difficulty. Affect pleasant.  Telemetry   NSR 80s-90s   EKG   n/a   Labs   Basic Metabolic Panel: Recent Labs  Lab 04/24/20 0455 04/24/20 0455 04/25/20 0540 04/25/20 0540 04/26/20 0647 04/27/20 0600 04/28/20 0400  NA 143  --  142  --  140 141 137  K 4.0  --  4.0  --  4.1 3.9 3.9  CL 109  --  107  --  106 106 106  CO2 27  --  27  --  25 27 25   GLUCOSE 142*  --  131*  --  169* 138* 127*  BUN 10  --  5*  --  7* 6* 8  CREATININE 0.84  --  0.72  --  0.84 0.74 0.82  CALCIUM 8.8*   < > 9.0   < > 9.2 9.3 9.1   < > = values in this interval not displayed.    Liver Function Tests: Recent Labs  Lab 04/26/20 0647  AST 15  ALT 10  ALKPHOS 76  BILITOT 0.6  PROT 6.0*  ALBUMIN 2.9*   No results for input(s): LIPASE, AMYLASE in the last 168 hours. No results for input(s): AMMONIA in  the last 168 hours.  CBC: Recent Labs  Lab 04/25/20 0540 04/26/20 0647 04/27/20 0600 04/27/20 1429 04/28/20 0400  WBC 8.7 9.0 8.2 19.2* 13.3*  HGB 10.1* 10.3* 10.4* 9.2* 8.4*  HCT 31.3* 32.2* 33.0* 28.8* 26.7*  MCV 92.9 92.0 92.7 93.2 94.0  PLT 292 326 329 317 271    INR: Recent Labs  Lab 04/24/20 0455 04/25/20 0540 04/26/20 0647 04/27/20 0600 04/28/20 0400  INR 1.0 1.0 1.0 1.0 1.2    Other results:  EKG:    Imaging   DG Chest Port 1 View  Result Date: 04/27/2020 CLINICAL DATA:  Evaluate LVAD EXAM: PORTABLE CHEST 1 VIEW COMPARISON:  04/26/2020 FINDINGS: LVAD again noted projecting over the cardiac apex. Prior median sternotomy. Interval placement of a right-sided PICC line with distal tip terminating at the level of the superior cavoatrial junction. Stable cardiomegaly. No focal airspace consolidation, pleural effusion, or pneumothorax. IMPRESSION: 1. Interval placement of  right-sided PICC line with distal tip terminating at the level of the superior cavoatrial junction. 2. Stable LVAD. Electronically Signed   By: Duanne Guess D.O.   On: 04/27/2020 15:36   VAS [name] LOWER EXTREMITY VENOUS (DVT)  Result Date: 04/26/2020  Lower Venous DVTStudy Indications: Edema, and Post-op.  Comparison Study: 07/22/18 DVT right pero Performing Technologist: Jeb Levering RDMS, RVT  Examination Guidelines: A complete evaluation includes B-mode imaging, spectral Doppler, color Doppler, and power Doppler as needed of all accessible portions of each vessel. Bilateral testing is considered an integral part of a complete examination. Limited examinations for reoccurring indications may be performed as noted. The reflux portion of the exam is performed with the patient in reverse Trendelenburg.  +---------+---------------+---------+-----------+----------+--------------+ RIGHT    CompressibilityPhasicitySpontaneityPropertiesThrombus Aging +---------+---------------+---------+-----------+----------+--------------+ CFV      Full           Yes      Yes                                 +---------+---------------+---------+-----------+----------+--------------+ SFJ      Full                                                        +---------+---------------+---------+-----------+----------+--------------+ FV Prox  Full                                                        +---------+---------------+---------+-----------+----------+--------------+ FV Mid   Full                                                        +---------+---------------+---------+-----------+----------+--------------+ FV DistalFull                                                        +---------+---------------+---------+-----------+----------+--------------+ PFV  Full                                                         +---------+---------------+---------+-----------+----------+--------------+ POP      Full           Yes      Yes                                 +---------+---------------+---------+-----------+----------+--------------+ PTV      Full                                                        +---------+---------------+---------+-----------+----------+--------------+ PERO     Full                                                        +---------+---------------+---------+-----------+----------+--------------+   +---------+---------------+---------+-----------+----------+--------------+ LEFT     CompressibilityPhasicitySpontaneityPropertiesThrombus Aging +---------+---------------+---------+-----------+----------+--------------+ CFV      Full           Yes      Yes                                 +---------+---------------+---------+-----------+----------+--------------+ SFJ      Full                                                        +---------+---------------+---------+-----------+----------+--------------+ FV Prox  Full                                                        +---------+---------------+---------+-----------+----------+--------------+ FV Mid   Full                                                        +---------+---------------+---------+-----------+----------+--------------+ FV DistalFull                                                        +---------+---------------+---------+-----------+----------+--------------+ PFV      Full                                                        +---------+---------------+---------+-----------+----------+--------------+  POP      Full           Yes      Yes                                 +---------+---------------+---------+-----------+----------+--------------+ PTV      Full                                                         +---------+---------------+---------+-----------+----------+--------------+ PERO     Full                                                        +---------+---------------+---------+-----------+----------+--------------+     Summary: RIGHT: - There is no evidence of deep vein thrombosis in the lower extremity.  - No cystic structure found in the popliteal fossa.  LEFT: - There is no evidence of deep vein thrombosis in the lower extremity.  - No cystic structure found in the popliteal fossa. - Ultrasound characteristics of enlarged lymph nodes noted in the groin.  *See table(s) above for measurements and observations. Electronically signed by Sherald Hess MD on 04/26/2020 at 3:55:50 PM.    Final      Medications:     Scheduled Medications: . sodium chloride   Intravenous Once  . acetaminophen  1,000 mg Oral Q6H   Or  . acetaminophen (TYLENOL) oral liquid 160 mg/5 mL  1,000 mg Oral Q6H  . amLODipine  10 mg Oral Daily  . aspirin  81 mg Oral Daily  . bisacodyl  10 mg Oral Daily  . fentaNYL   Intravenous Q4H  . gabapentin  600 mg Oral QID  . hydrALAZINE  50 mg Oral Q8H  . insulin aspart  0-24 Units Subcutaneous TID AC & HS  . insulin aspart  0-5 Units Subcutaneous QHS  . mupirocin cream   Topical BID  . pantoprazole  40 mg Oral Daily  . rifabutin  300 mg Oral Daily  . sacubitril-valsartan  1 tablet Oral BID  . senna-docusate  1 tablet Oral QHS  . sodium chloride flush  10-40 mL Intracatheter Q12H  . spironolactone  25 mg Oral Daily  . thiamine  100 mg Oral Daily  . varenicline  1 mg Oral Daily  . vitamin B-12  1,000 mcg Oral Daily  . zinc sulfate  220 mg Oral BID    Infusions: . sodium chloride    .  ceFAZolin (ANCEF) IV 2 g (04/28/20 7253)  . dextrose 5% lactated ringers    . dextrose 5 % and 0.45% NaCl Stopped (04/28/20 0220)  . norepinephrine (LEVOPHED) Adult infusion Stopped (04/19/20 1110)    PRN Medications: Place/Maintain arterial line **AND** sodium chloride,  acetaminophen, bisacodyl, diphenhydrAMINE **OR** diphenhydrAMINE, fentaNYL (SUBLIMAZE) injection, fluticasone, hydrALAZINE, Ipratropium-Albuterol, naloxone **AND** sodium chloride flush, ondansetron (ZOFRAN) IV, ondansetron (ZOFRAN) IV, oxyCODONE, oxyCODONE, sodium chloride flush, sodium chloride flush, traMADol, zolpidem   Assessment/Plan:    1. 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 He previously required debridement of lower sternalwound with woundVAC therapy and IV antibiotics.This was  completed with 3 surgical procedures including debridement and ACell application in March 2376.  More recently, the lower sternal wound reopened with drainage again culturing MSSA and he had been on cephalexin + doxycycline.  The day of admission, he developed persistent bleeding from the wound site and came to ER, bleeding now stopped with suture.  Bleeding again 5/30, now stopped again. Got an additional 1 units PRBCs on 5/31 and again 6/1. Overall he has received 3 units PRBCs + 5 units FFP.  ID consulted and switched antibiotics to cefazolin.  1/2 blood cultures + for MSSA. CT chest/abd/pelvis--> Stable small amount of soft tissue tracking along the proximal drive line in the subcutaneous ventral left abdominal wall.  S/p I&D + wound vac placement on 6/1.   Back to OR for debridement and wound vac change on 6/4.  Back to OR for rectus muscle flap to cover sternal wound + wound vac on 6/10.  - Continue cefazolin IV + rifabutin. ID looking into cost of rifabutin.  - Restart heparin gtt today and warfarin tonight, per Dr. Prescott Gum.  2. COVID-19 infection: Asymptomatic. No cough/dyspnea.  Repeat COVID-19 test negative 6/5. - He is off isolation.    3. 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 LVAD parameters stable.  Has been NYHA class II.  MAPs low initially, started on phenylephrine (may have been at least  partly vagal as very diaphoretic with bleeding).  Speed back up to 5700 rpm. Cuff MAPs stable in the 80s - Restart Entresto, spiro, hydralazine and amlodipine. Monitor BP closely  4. Smoking:He has cut back a lot, still taking Chantix. Encouraged to quit. 5. H/o RLE DVT: On warfarin for LVAD but held for wound exploration 6. OSA: Continue CPAP.  7. Hyperlipidemia: Atorvastatin.  8. Type II diabetes:SSI 9. Anemia, Acute blood loss: Hgb down, 10.4>>9.2>>8.4. Expected surgical blood loss. Monitor closely    I reviewed the LVAD parameters from today, and compared the results to the patient's prior recorded data.  No programming changes were made.  The LVAD is functioning within specified parameters.  The patient performs LVAD self-test daily.  LVAD interrogation was negative for any significant power changes, alarms or PI events/speed drops.  LVAD equipment check completed and is in good working order.  Back-up equipment present.   LVAD education done on emergency procedures and precautions and reviewed exit site care.  Length of Stay: [address] Ladoris Gene  04/28/2020 7:44 AM  VAD Team --- VAD ISSUES ONLY--- Pager [phone] (7am - 7am)  Advanced Heart Failure Team  Pager [phone] (M-F; 7a - 4p)  Please contact [facility] for night-coverage after hours (4p -7a ) and weekends on [email or web address]  Patient seen with PA, agree with the above note.   Stable this morning post-op. MAP 80s-92.  Pain at surgical site, using PCA.  CVP 5.   General: Well appearing this am. NAD.  HEENT: Normal. Neck: Supple, JVP 7-8 cm. Carotids OK.  Cardiac:  Mechanical heart sounds with LVAD hum present.  Lungs:  CTAB, normal effort.  Abdomen:  NT, ND, no HSM. No bruits or masses. +BS  LVAD exit site: Well-healed and incorporated. Dressing dry and intact. No erythema or drainage. Stabilization device present and accurately applied. Driveline dressing changed daily per sterile technique. Wound vac on chest.    Extremities:  Warm and dry. No cyanosis, clubbing, rash, or edema.  Neuro:  Alert & oriented x 3. Cranial nerves grossly intact. Moves all 4  extremities w/o difficulty. Affect pleasant    Continue home BP meds, stable MAP today.   No diuretic needed.   Continues on cefazolin/rifabutin, has PICC for home infusion.   Heparin to restart today and warfarin tonight, home when INR up to 1.8.   Marca Ancona 04/28/2020 9:45 AM

## 2020-04-29 LAB — CBC
HCT: 26.5 % — ABNORMAL LOW (ref 39.0–52.0)
Hemoglobin: 8.4 g/dL — ABNORMAL LOW (ref 13.0–17.0)
MCH: 29.2 pg (ref 26.0–34.0)
MCHC: 31.7 g/dL (ref 30.0–36.0)
MCV: 92 fL (ref 80.0–100.0)
Platelets: 237 10*3/uL (ref 150–400)
RBC: 2.88 MIL/uL — ABNORMAL LOW (ref 4.22–5.81)
RDW: 13.6 % (ref 11.5–15.5)
WBC: 12.4 10*3/uL — ABNORMAL HIGH (ref 4.0–10.5)
nRBC: 0 % (ref 0.0–0.2)

## 2020-04-29 LAB — COMPREHENSIVE METABOLIC PANEL
ALT: 7 U/L (ref 0–44)
AST: 11 U/L — ABNORMAL LOW (ref 15–41)
Albumin: 2.8 g/dL — ABNORMAL LOW (ref 3.5–5.0)
Alkaline Phosphatase: 61 U/L (ref 38–126)
Anion gap: 8 (ref 5–15)
BUN: 6 mg/dL — ABNORMAL LOW (ref 8–23)
CO2: 25 mmol/L (ref 22–32)
Calcium: 8.9 mg/dL (ref 8.9–10.3)
Chloride: 107 mmol/L (ref 98–111)
Creatinine, Ser: 0.78 mg/dL (ref 0.61–1.24)
GFR calc Af Amer: >60 mL/min (ref >60)
GFR calc non Af Amer: >60 mL/min (ref >60)
Glucose, Bld: 126 mg/dL — ABNORMAL HIGH (ref 70–99)
Potassium: 3.7 mmol/L (ref 3.5–5.1)
Sodium: 140 mmol/L (ref 135–145)
Total Bilirubin: 0.7 mg/dL (ref 0.3–1.2)
Total Protein: 5.7 g/dL — ABNORMAL LOW (ref 6.5–8.1)

## 2020-04-29 LAB — GLUCOSE, CAPILLARY
Glucose-Capillary: 141 mg/dL — ABNORMAL HIGH (ref 70–99)
Glucose-Capillary: 127 mg/dL — ABNORMAL HIGH (ref 70–99)
Glucose-Capillary: 87 mg/dL (ref 70–99)
Glucose-Capillary: 95 mg/dL (ref 70–99)

## 2020-04-29 LAB — HEPARIN LEVEL (UNFRACTIONATED): Heparin Unfractionated: <0.1 IU/mL — ABNORMAL LOW (ref 0.30–0.70)

## 2020-04-29 LAB — LACTATE DEHYDROGENASE: LDH: 130 U/L (ref 98–192)

## 2020-04-29 LAB — PROTIME-INR
INR: 1.2 (ref 0.8–1.2)
Prothrombin Time: 14.6 seconds (ref 11.4–15.2)

## 2020-04-29 LAB — PREPARE RBC (CROSSMATCH)

## 2020-04-29 MED ORDER — SODIUM CHLORIDE 0.9 % IV BOLUS
500.0000 mL | Freq: Once | INTRAVENOUS | Status: AC
  Administered 2020-04-29: 500 mL via INTRAVENOUS

## 2020-04-29 MED ORDER — WARFARIN SODIUM 2.5 MG PO TABS
2.5000 mg | ORAL_TABLET | Freq: Once | ORAL | Status: AC
  Administered 2020-04-29: 2.5 mg via ORAL
  Filled 2020-04-29: qty 1

## 2020-04-29 MED ORDER — HYDRALAZINE HCL 25 MG PO TABS
25.0000 mg | ORAL_TABLET | Freq: Three times a day (TID) | ORAL | Status: DC
  Administered 2020-04-30 – 2020-05-06 (×16): 25 mg via ORAL
  Filled 2020-04-29 (×20): qty 1

## 2020-04-29 MED ORDER — SODIUM CHLORIDE 0.9% IV SOLUTION: Freq: Once | INTRAVENOUS | Status: AC

## 2020-04-29 NOTE — Progress Notes (Signed)
04/28/20: Received page from bedside RN at 2350 re: MAP in 60s. VAD numbers stable with flow 4.1 PI 2.7, no bleeding noted from wound vac or JP drains. No BP meds given this evening. Using Fentanyl PCA. Order given for 500 cc NS bolus. Dr Gala Romney aware of the above- will recheck CBC and transfuse 1 unit PRBC due to Hgb trending down over the last few days. RN verbalized understanding of orders.   04/29/20: Received page this morning re: persistent low MAP. Doppler 84 (possible modified systolic) with auto cuff BP 74/47 (54). VAD numbers stable; no bleeding noted. Patient is asymptomatic. Will give 500 cc NS bolus. Dr Gala Romney aware.   Exit site care: Discussed daily drive line dressing change with bedside RN: Take dressing down to the mepitel, leaving mepitel in place, cleansing with betadine, coat mepitel with sterile gel, top with with saline moistened guaze, and sterile 4 x 4s. She verbalized understanding.   Alyce Pagan RN VAD Coordinator  Office: [phone]  24/7 Pager: [phone]

## 2020-04-29 NOTE — Progress Notes (Signed)
Pharmacy Antibiotic Note  Theodore Welch is a 59 y.o. male admitted on 04/16/2020 LVAD sternal wound infection. Pt has hx of recurrent MSSA infection and is now s/p 8 weeks cefazolin followed by cephalexin.  S/p exploration in OR 6/1 with wound vac change in OR 6/4. Sternal wound debridement and muscle flap done on 6/10.  Afebrile, WBC 12.4, Scr 0.79.  Plan is for extended course of ancef per ID.   5/30 Bcx 1/2 MSSA  6/1 sternum: MSSA 6/3 Bcx ngF 6/4 wound: ngF  Plan: Continue cefazolin 2g IV q8h Continue rifabutin 300 mg qd  Height: 5\' 11"  (180.3 cm) Weight: 91.8 kg (202 lb 6.1 oz) IBW/kg (Calculated) : 75.3  Temp (24hrs), Avg:98.9 F (37.2 C), Min:98.3 F (36.8 C), Max:99.7 F (37.6 C)  Recent Labs  Lab 04/25/20 0540 04/25/20 0540 04/26/20 0647 04/26/20 0647 04/27/20 0600 04/27/20 1429 04/28/20 0400 04/28/20 1844 04/29/20 0500  WBC 8.7   < > 9.0   < > 8.2 19.2* 13.3* 13.7* 12.4*  CREATININE 0.72  --  0.84  --  0.74  --  0.82  --  0.78   < > = values in this interval not displayed.    Estimated Creatinine Clearance: 112.3 mL/min (by C-G formula based on SCr of 0.78 mg/dL).    Allergies  Allergen Reactions  . Chlorhexidine Rash  . Other Rash    Prep pads    Thank you for allowing pharmacy to be a part of this patient's care.  06/29/20, PharmD, BCPS PGY2 Cardiology Pharmacy Resident Phone [phone] 04/29/2020       2:28 PM  Please check [email or web address] for unit-specific pharmacist phone numbers

## 2020-04-29 NOTE — Progress Notes (Signed)
Patient ID: Theodore Welch, male   DOB: 12/29/1960, 59 y.o.   MRN: [id number]   Advanced Heart Failure VAD Team Note  PCP-Cardiologist: No primary care provider on file.   Subjective:    Events: - Admitted w/ subxiphoid abscess and cellulitis + bleeding from wound site. Admit Hgb 7.4. COVID +.   Speed increased back to 5700.  - S/p multiple transfusions. Has received total of 3 units PRBCs and 5 units FFP.  - Blood cultures 1/2 positive for MSSA.  On cefazolin and rifabutin added .   - Went to OR 6/1 for I&D of chest wound + repair of VAD outflow graft. Required fem-fem cardiopulmonary bypass. Wound vac placed. Back to OR 6/4 for debridement and wound vac change.  - Back to OR 6/10 for rectus muscle flap over wound and wound VAC.   Received another unit RBCs last night for hgb 7.6. Hgb 8.4 this am. No obvious bleeding. MAP in 60s this am. Given 500 cc NS. MAPs now in 47s,  Says he is feeling some better today but still with pain on R side. Had several BMs this am. Denies SOB. Tolerating clears.   LVAD INTERROGATION:  HeartMate III LVAD:   Flow 4.5  liters/min, speed 5700, power 4.0 PI 3.8 multiple PI events this am. Better this afternoon. Personally reviewed   Objective:    Vital Signs:   Temp:  [98.2 F (36.8 C)-99.7 F (37.6 C)] 98.3 F (36.8 C) (06/12 0800) Pulse Rate:  [79-98] 84 (06/12 1200) Resp:  [17-30] 26 (06/12 1200) BP: (60-96)/(47-80) 96/68 (06/12 1200) SpO2:  [98 %-100 %] 100 % (06/12 0800) Weight:  [91.8 kg] 91.8 kg (06/12 0415) Last BM Date: 04/24/20 Mean arterial Pressure 66 -> 78 Intake/Output:   Intake/Output Summary (Last 24 hours) at 04/29/2020 1327 Last data filed at 04/29/2020 1200 Gross per 24 hour  Intake 1839.72 ml  Output 2045 ml  Net -205.28 ml     Physical Exam    General:  NAD.  HEENT: normal  Neck: supple. JVP not elevated.  Carotids 2+ bilat; no bruits. No lymphadenopathy or thryomegaly appreciated. Cor: LVAD hum. + sternal wound vac    Lungs: Clear. Abdomen:  soft, nontender, + distended. No hepatosplenomegaly. No bruits or masses. Good bowel sounds. Driveline site clean. Anchor in place.  Extremities: no cyanosis, clubbing, rash. Warm no edema  Neuro: alert & oriented x 3. No focal deficits. Moves all 4 without problem   Telemetry   NSR 80-90s Personally reviewed    Labs   Basic Metabolic Panel: Recent Labs  Lab 04/25/20 0540 04/25/20 0540 04/26/20 0647 04/26/20 0647 04/27/20 0600 04/28/20 0400 04/29/20 0500  NA 142  --  140  --  141 137 140  K 4.0  --  4.1  --  3.9 3.9 3.7  CL 107  --  106  --  106 106 107  CO2 27  --  25  --  27 25 25   GLUCOSE 131*  --  169*  --  138* 127* 126*  BUN 5*  --  7*  --  6* 8 6*  CREATININE 0.72  --  0.84  --  0.74 0.82 0.78  CALCIUM 9.0   < > 9.2   < > 9.3 9.1 8.9   < > = values in this interval not displayed.    Liver Function Tests: Recent Labs  Lab 04/26/20 0647 04/29/20 0500  AST 15 11*  ALT 10 7  ALKPHOS 76 61  BILITOT 0.6 0.7  PROT 6.0* 5.7*  ALBUMIN 2.9* 2.8*   No results for input(s): LIPASE, AMYLASE in the last 168 hours. No results for input(s): AMMONIA in the last 168 hours.  CBC: Recent Labs  Lab 04/27/20 0600 04/27/20 1429 04/28/20 0400 04/28/20 1844 04/29/20 0500  WBC 8.2 19.2* 13.3* 13.7* 12.4*  HGB 10.4* 9.2* 8.4* 7.6* 8.4*  HCT 33.0* 28.8* 26.7* 24.1* 26.5*  MCV 92.7 93.2 94.0 93.1 92.0  PLT 329 317 271 245 237    INR: Recent Labs  Lab 04/25/20 0540 04/26/20 0647 04/27/20 0600 04/28/20 0400 04/29/20 0500  INR 1.0 1.0 1.0 1.2 1.2    Other results:  EKG:    Imaging   DG Chest Port 1 View  Result Date: 04/27/2020 CLINICAL DATA:  Evaluate LVAD EXAM: PORTABLE CHEST 1 VIEW COMPARISON:  04/26/2020 FINDINGS: LVAD again noted projecting over the cardiac apex. Prior median sternotomy. Interval placement of a right-sided PICC line with distal tip terminating at the level of the superior cavoatrial junction. Stable  cardiomegaly. No focal airspace consolidation, pleural effusion, or pneumothorax. IMPRESSION: 1. Interval placement of right-sided PICC line with distal tip terminating at the level of the superior cavoatrial junction. 2. Stable LVAD. Electronically Signed   By: Duanne Guess D.O.   On: 04/27/2020 15:36     Medications:     Scheduled Medications:  sodium chloride   Intravenous Once   acetaminophen  1,000 mg Oral Q6H   Or   acetaminophen (TYLENOL) oral liquid 160 mg/5 mL  1,000 mg Oral Q6H   amLODipine  10 mg Oral Daily   aspirin  81 mg Oral Daily   bisacodyl  10 mg Oral Daily   fentaNYL   Intravenous Q4H   gabapentin  600 mg Oral QID   hydrALAZINE  50 mg Oral Q8H   insulin aspart  0-24 Units Subcutaneous TID AC & HS   mupirocin cream   Topical BID   pantoprazole  40 mg Oral Daily   rifabutin  300 mg Oral Daily   sacubitril-valsartan  1 tablet Oral BID   senna-docusate  1 tablet Oral QHS   sodium chloride flush  10-40 mL Intracatheter Q12H   spironolactone  25 mg Oral Daily   thiamine  100 mg Oral Daily   varenicline  1 mg Oral Daily   vitamin B-12  1,000 mcg Oral Daily   Warfarin - Physician Dosing Inpatient   Does not apply q1600   zinc sulfate  220 mg Oral BID    Infusions:  sodium chloride     sodium chloride     sodium chloride 500 mL (04/28/20 0827)    ceFAZolin (ANCEF) IV 2 g (04/29/20 0557)   heparin 500 Units/hr (04/29/20 0900)   norepinephrine (LEVOPHED) Adult infusion Stopped (04/19/20 1110)    PRN Medications: Place/Maintain arterial line **AND** sodium chloride, sodium chloride, sodium chloride, acetaminophen, bisacodyl, diphenhydrAMINE **OR** diphenhydrAMINE, fentaNYL (SUBLIMAZE) injection, fluticasone, hydrALAZINE, Ipratropium-Albuterol, naloxone **AND** sodium chloride flush, ondansetron (ZOFRAN) IV, ondansetron (ZOFRAN) IV, oxyCODONE, oxyCODONE, sodium chloride flush, sodium chloride flush, traMADol,  zolpidem   Assessment/Plan:    1. 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 He previously required debridement of lower sternalwound with woundVAC therapy and IV antibiotics.This was completed with 3 surgical procedures including debridement and ACell application in March 2021.  More recently, the lower sternal wound reopened with drainage again culturing MSSA and he had been on cephalexin + doxycycline.  The day of  admission, he developed persistent bleeding from the wound site and came to ER, bleeding now stopped with suture.  Bleeding again 5/30, now stopped again. Got an additional 1 units PRBCs on 5/31 and again 6/1. Overall he has received 3 units PRBCs + 5 units FFP.  ID consulted and switched antibiotics to cefazolin.  1/2 blood cultures + for MSSA. CT chest/abd/pelvis--> Stable small amount of soft tissue tracking along the proximal drive line in the subcutaneous ventral left abdominal wall.  S/p I&D + wound vac placement on 6/1.   Back to OR for debridement and wound vac change on 6/4.  Back to OR for rectus muscle flap to cover sternal wound + wound vac on 6/10.  - Wound vac site looks good. No drainage.  - Continue cefazolin IV + rifabutin. ID looking into cost of rifabutin.  - Continue heparin gtt  and warfarin. INR 1.2 Discussed dosing with PharmD personally. 2. COVID-19 infection: Asymptomatic. No cough/dyspnea.  Repeat COVID-19 test negative 6/5. - He is off isolation.    3. 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 LVAD parameters stable.  Has been NYHA class II.  MAPs low initially, started on phenylephrine (may have been at least partly vagal as very diaphoretic with bleeding).  Speed back up to 5700 rpm. MAPs low today after restarting home meds yesterday.  - Stop Entresto, amlodipine and spiro. Cut hydralazine to 25 tid. Can use prn IV hydral for HTN - VAD interrogated  personally. Parameters stable. 4. Smoking:He has cut back a lot, still taking Chantix. Encouraged to quit. 5. H/o RLE DVT: On warfarin for LVAD but held for wound exploration 6. OSA: Continue CPAP.  7. Hyperlipidemia: Atorvastatin.  8. Type II diabetes:SSI 9. Anemia, Acute blood loss: -  Hgb down, 10.4>>9.2>>8.4 > 7.1 > 8.4 after 1u RRBC last night Expected surgical blood loss. Monitor closely 10. Post-op ileus. - improving   I reviewed the LVAD parameters from today, and compared the results to the patient's prior recorded data.  No programming changes were made.  The LVAD is functioning within specified parameters.  The patient performs LVAD self-test daily.  LVAD interrogation was negative for any significant power changes, alarms or PI events/speed drops.  LVAD equipment check completed and is in good working order.  Back-up equipment present.   LVAD education done on emergency procedures and precautions and reviewed exit site care.  Length of Stay: 13  Arvilla Meres MD 04/29/2020 1:27 PM  VAD Team --- VAD ISSUES ONLY--- Pager [phone] (7am - 7am)  Advanced Heart Failure Team  Pager [phone] (M-F; 7a - 4p)  Please contact [facility] for night-coverage after hours (4p -7a ) and weekends on [email or web address]

## 2020-04-29 NOTE — Plan of Care (Signed)

## 2020-04-29 NOTE — Progress Notes (Signed)
ANTICOAGULATION CONSULT NOTE   Pharmacy Consult for heparin - per TCTS dosing Indication: LVAD  Allergies  Allergen Reactions  . Chlorhexidine Rash  . Other Rash    Prep pads    Patient Measurements: Height: 5\' 11"  (180.3 cm) Weight: 91.8 kg (202 lb 6.1 oz) IBW/kg (Calculated) : 75.3 Heparin Dosing Weight: ~ 90 kg  Vital Signs: Temp: 98.3 F (36.8 C) (06/12 0800) Temp Source: Oral (06/12 0800) BP: 96/68 (06/12 1200) Pulse Rate: 84 (06/12 1200)  Labs: Recent Labs    04/27/20 0600 04/27/20 1429 04/28/20 0400 04/28/20 0400 04/28/20 1844 04/29/20 0500  HGB 10.4*   < > 8.4*   < > 7.6* 8.4*  HCT 33.0*   < > 26.7*  --  24.1* 26.5*  PLT 329   < > 271  --  245 237  LABPROT 13.2  --  14.3  --   --  14.6  INR 1.0  --  1.2  --   --  1.2  HEPARINUNFRC <0.10*  --  <0.10*  --   --  <0.10*  CREATININE 0.74  --  0.82  --   --  0.78   < > = values in this interval not displayed.    Estimated Creatinine Clearance: 112.3 mL/min (by C-G formula based on SCr of 0.78 mg/dL).   Medical History: Past Medical History:  Diagnosis Date  . 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 Cardiomyopathy, unspecified (HCC)   . CHF (congestive heart failure) (HCC)   . Chronic back pain   . Diabetes mellitus without complication (HCC)   . Enlarged heart   . Gastric ulcer   . Gastroenteritis   . H/O degenerative disc disease   . Hypertension   . LVAD (left ventricular assist device) present (HCC)     Medications:   Infusions:  . sodium chloride    . sodium chloride    . sodium chloride 500 mL (04/28/20 0827)  .  ceFAZolin (ANCEF) IV 2 g (04/29/20 0557)  . heparin 500 Units/hr (04/29/20 0900)  . norepinephrine (LEVOPHED) Adult infusion Stopped (04/19/20 1110)    Assessment: 59 yo male with LVAD - HM3 + ASA 81mg  daily, MSSA bacteremia and  driveline infection.  Chronic Coumadin has been on hold for procedures - flap 6/10.  Heparin started 6/3 at low dose.  PTA dose of warfarin: 7.5 mg  MWF, 5 mg all other days   Underwent sternal wound debridement with flap and wound VAC application on 6/10 - discussed with Dr - off heparin overnight on 6/10. MD okay to start heparin infusion 500 units/hr and ordered warfarin 2.5 mg on 6/11.  INR today is 1.2  Goal of Therapy:  Monitor platelets by anticoagulation protocol: Yes   Plan:  Continue heparin 500 units/hr  Warfarin 2.5 mg x 1 again today Will get AM heparin labs (no titration)  Monitor daily INR  8/10, PharmD, [facility] PGY2 Cardiology Pharmacy Resident Phone [phone] 04/29/2020       2:26 PM  Please check [email or web address] for unit-specific pharmacist phone numbers

## 2020-04-29 NOTE — Progress Notes (Signed)
Pt refused standing weight this morning due to increased pain. Bed weight obtained.

## 2020-04-30 LAB — TYPE AND SCREEN
ABO/RH(D): O POS
Antibody Screen: NEGATIVE
Unit division: 0
Unit division: 0
Unit division: 0
Unit division: 0

## 2020-04-30 LAB — BPAM RBC
Blood Product Expiration Date: 202107122359
Blood Product Expiration Date: 202107122359
Blood Product Expiration Date: 202107122359
Blood Product Expiration Date: 202107122359
ISSUE DATE / TIME: 202106100807
ISSUE DATE / TIME: 202106120020
Unit Type and Rh: 5100
Unit Type and Rh: 5100
Unit Type and Rh: 5100
Unit Type and Rh: 5100

## 2020-04-30 LAB — BASIC METABOLIC PANEL
Anion gap: 7 (ref 5–15)
BUN: 5 mg/dL — ABNORMAL LOW (ref 8–23)
CO2: 24 mmol/L (ref 22–32)
Calcium: 8.7 mg/dL — ABNORMAL LOW (ref 8.9–10.3)
Chloride: 110 mmol/L (ref 98–111)
Creatinine, Ser: 0.71 mg/dL (ref 0.61–1.24)
GFR calc Af Amer: >60 mL/min (ref >60)
GFR calc non Af Amer: >60 mL/min (ref >60)
Glucose, Bld: 110 mg/dL — ABNORMAL HIGH (ref 70–99)
Potassium: 3.3 mmol/L — ABNORMAL LOW (ref 3.5–5.1)
Sodium: 141 mmol/L (ref 135–145)

## 2020-04-30 LAB — CBC
HCT: 25.4 % — ABNORMAL LOW (ref 39.0–52.0)
Hemoglobin: 8 g/dL — ABNORMAL LOW (ref 13.0–17.0)
MCH: 29.1 pg (ref 26.0–34.0)
MCHC: 31.5 g/dL (ref 30.0–36.0)
MCV: 92.4 fL (ref 80.0–100.0)
Platelets: 242 10*3/uL (ref 150–400)
RBC: 2.75 MIL/uL — ABNORMAL LOW (ref 4.22–5.81)
RDW: 13.4 % (ref 11.5–15.5)
WBC: 9.2 10*3/uL (ref 4.0–10.5)
nRBC: 0 % (ref 0.0–0.2)

## 2020-04-30 LAB — PROTIME-INR
INR: 1.2 (ref 0.8–1.2)
Prothrombin Time: 14.6 seconds (ref 11.4–15.2)

## 2020-04-30 LAB — LACTATE DEHYDROGENASE: LDH: 137 U/L (ref 98–192)

## 2020-04-30 LAB — GLUCOSE, CAPILLARY
Glucose-Capillary: 101 mg/dL — ABNORMAL HIGH (ref 70–99)
Glucose-Capillary: 129 mg/dL — ABNORMAL HIGH (ref 70–99)

## 2020-04-30 LAB — HEPARIN LEVEL (UNFRACTIONATED): Heparin Unfractionated: <0.1 IU/mL — ABNORMAL LOW (ref 0.30–0.70)

## 2020-04-30 LAB — MAGNESIUM: Magnesium: 1.6 mg/dL — ABNORMAL LOW (ref 1.7–2.4)

## 2020-04-30 MED ORDER — POTASSIUM CHLORIDE CRYS ER 20 MEQ PO TBCR
20.0000 meq | EXTENDED_RELEASE_TABLET | Freq: Once | ORAL | Status: AC
  Administered 2020-04-30: 20 meq via ORAL
  Filled 2020-04-30: qty 1

## 2020-04-30 MED ORDER — POTASSIUM CHLORIDE 10 MEQ/50ML IV SOLN
10.0000 meq | INTRAVENOUS | Status: AC
  Administered 2020-04-30 (×2): 10 meq via INTRAVENOUS
  Filled 2020-04-30 (×2): qty 50

## 2020-04-30 MED ORDER — MAGNESIUM SULFATE 2 GM/50ML IV SOLN
2.0000 g | Freq: Once | INTRAVENOUS | Status: AC
  Administered 2020-04-30: 2 g via INTRAVENOUS
  Filled 2020-04-30: qty 50

## 2020-04-30 MED ORDER — WARFARIN SODIUM 2.5 MG PO TABS
2.5000 mg | ORAL_TABLET | Freq: Once | ORAL | Status: AC
  Administered 2020-04-30: 2.5 mg via ORAL
  Filled 2020-04-30: qty 1

## 2020-04-30 MED ORDER — POTASSIUM CHLORIDE CRYS ER 20 MEQ PO TBCR
40.0000 meq | EXTENDED_RELEASE_TABLET | Freq: Once | ORAL | Status: AC
  Administered 2020-04-30: 40 meq via ORAL
  Filled 2020-04-30: qty 2

## 2020-04-30 NOTE — Progress Notes (Addendum)
ANTICOAGULATION CONSULT NOTE   Pharmacy Consult for heparin - per TCTS dosing Indication: LVAD  Allergies  Allergen Reactions  . Chlorhexidine Rash  . Other Rash    Prep pads    Patient Measurements: Height: 5\' 11"  (180.3 cm) Weight: 92.3 kg (203 lb 7.8 oz) IBW/kg (Calculated) : 75.3 Heparin Dosing Weight: ~ 90 kg  Vital Signs: Temp: 98.2 F (36.8 C) (06/13 0800) Temp Source: Oral (06/13 0800) BP: 100/80 (06/13 1000) Pulse Rate: 80 (06/13 0400)  Labs: Recent Labs    04/28/20 0400 04/28/20 0400 04/28/20 1844 04/28/20 1844 04/29/20 0500 04/30/20 0455  HGB 8.4*   < > 7.6*   < > 8.4* 8.0*  HCT 26.7*   < > 24.1*  --  26.5* 25.4*  PLT 271   < > 245  --  237 242  LABPROT 14.3  --   --   --  14.6 14.6  INR 1.2  --   --   --  1.2 1.2  HEPARINUNFRC <0.10*  --   --   --  <0.10* <0.10*  CREATININE 0.82  --   --   --  0.78 0.71   < > = values in this interval not displayed.    Estimated Creatinine Clearance: 112.6 mL/min (by C-G formula based on SCr of 0.71 mg/dL).   Medical History: Past Medical History:  Diagnosis Date  . 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 Cardiomyopathy, unspecified (HCC)   . CHF (congestive heart failure) (HCC)   . Chronic back pain   . Diabetes mellitus without complication (HCC)   . Enlarged heart   . Gastric ulcer   . Gastroenteritis   . H/O degenerative disc disease   . Hypertension   . LVAD (left ventricular assist device) present (HCC)     Medications:   Infusions:  . sodium chloride    . sodium chloride    . sodium chloride 500 mL (04/28/20 0827)  .  ceFAZolin (ANCEF) IV Stopped (04/30/20 0027)  . heparin 500 Units/hr (04/30/20 0900)  . magnesium sulfate bolus IVPB    . norepinephrine (LEVOPHED) Adult infusion Stopped (04/19/20 1110)    Assessment: 59 yo male with LVAD - HM3 + ASA 81mg  daily, MSSA bacteremia and  driveline infection.  Chronic Coumadin has been on hold for procedures - flap 6/10.  Heparin started 6/3 at low  dose.  PTA dose of warfarin: 7.5 mg MWF, 5 mg all other days   Underwent sternal wound debridement with flap and wound VAC application on 6/10 - discussed with Dr - off heparin overnight on 6/10. MD okay to start heparin infusion 500 units/hr and ordered warfarin to resume on 6/11.   INR today is 1.2  Goal of Therapy:  Monitor platelets by anticoagulation protocol: Yes   Plan:  Continue heparin 500 units/hr  Warfarin 2.5 mg x 1 again today Will get AM heparin labs (no titration)  Monitor daily INR  8/10, PharmD, [facility] PGY2 Cardiology Pharmacy Resident Phone [phone] 04/30/2020       1:06 PM  Please check [email or web address] for unit-specific pharmacist phone numbers

## 2020-04-30 NOTE — Plan of Care (Signed)
  Problem: Education: Goal: Knowledge of General Education information will improve Description Including pain rating scale, medication(s)/side effects and non-pharmacologic comfort measures Outcome: Progressing   

## 2020-04-30 NOTE — Progress Notes (Addendum)
Patient ID: Theodore Welch, male   DOB: 01-05-1961, 59 y.o.   MRN: [id number]   Advanced Heart Failure VAD Team Note  PCP-Cardiologist: No primary care provider on file.   Subjective:    Events: - Admitted w/ subxiphoid abscess and cellulitis + bleeding from wound site. Admit Hgb 7.4. COVID +.   Speed increased back to 5700.  - S/p multiple transfusions. Has received total of 3 units PRBCs and 5 units FFP.  - Blood cultures 1/2 positive for MSSA.  On cefazolin and rifabutin added .   - Went to OR 6/1 for I&D of chest wound + repair of VAD outflow graft. Required fem-fem cardiopulmonary bypass. Wound vac placed. Back to OR 6/4 for debridement and wound vac change.  - Back to OR 6/10 for rectus muscle flap over wound and wound VAC.   Feeling much better today. Improved pain control. Still using PCA. Multiple BMs. Says he is hungry today.   LVAD INTERROGATION:  HeartMate III LVAD:   Flow 4.8  liters/min, speed 5700, power 4.4  PI 2.7 multiple VAD interrogated personally. Parameters stable.  Objective:    Vital Signs:   Temp:  [98 F (36.7 C)-99.3 F (37.4 C)] 98.2 F (36.8 C) (06/13 0800) Pulse Rate:  [79-89] 80 (06/13 0400) Resp:  [17-30] 24 (06/13 0900) BP: (89-104)/(68-80) 89/73 (06/13 0800) SpO2:  [100 %] 100 % (06/13 0838) Weight:  [92.3 kg] 92.3 kg (06/13 0510) Last BM Date: 04/29/20 Mean arterial Pressure 80 Intake/Output:   Intake/Output Summary (Last 24 hours) at 04/30/2020 1020 Last data filed at 04/30/2020 0900 Gross per 24 hour  Intake 1624.82 ml  Output 1160 ml  Net 464.82 ml     Physical Exam    General:  NAD.  HEENT: normal  Neck: supple. JVP not elevated.  Carotids 2+ bilat; no bruits. No lymphadenopathy or thryomegaly appreciated. Cor: LVAD hum.  Lungs: Clear. Abdomen: soft, nontender, + distended. Dressings in place No hepatosplenomegaly. No bruits or masses. Good bowel sounds. Driveline site clean. Anchor in place.  Extremities: no cyanosis, clubbing,  rash. Warm no edema  Neuro: alert & oriented x 3. No focal deficits. Moves all 4 without problem    Telemetry   NSR 80s Personally reviewed    Labs   Basic Metabolic Panel: Recent Labs  Lab 04/26/20 0647 04/26/20 0647 04/27/20 0600 04/27/20 0600 04/28/20 0400 04/29/20 0500 04/30/20 0455 04/30/20 0547  NA 140  --  141  --  137 140 141  --   K 4.1  --  3.9  --  3.9 3.7 3.3*  --   CL 106  --  106  --  106 107 110  --   CO2 25  --  27  --  25 25 24   --   GLUCOSE 169*  --  138*  --  127* 126* 110*  --   BUN 7*  --  6*  --  8 6* 5*  --   CREATININE 0.84  --  0.74  --  0.82 0.78 0.71  --   CALCIUM 9.2   < > 9.3   < > 9.1 8.9 8.7*  --   MG  --   --   --   --   --   --   --  1.6*   < > = values in this interval not displayed.    Liver Function Tests: Recent Labs  Lab 04/26/20 0647 04/29/20 0500  AST 15 11*  ALT 10  7  ALKPHOS 76 61  BILITOT 0.6 0.7  PROT 6.0* 5.7*  ALBUMIN 2.9* 2.8*   No results for input(s): LIPASE, AMYLASE in the last 168 hours. No results for input(s): AMMONIA in the last 168 hours.  CBC: Recent Labs  Lab 04/27/20 1429 04/28/20 0400 04/28/20 1844 04/29/20 0500 04/30/20 0455  WBC 19.2* 13.3* 13.7* 12.4* 9.2  HGB 9.2* 8.4* 7.6* 8.4* 8.0*  HCT 28.8* 26.7* 24.1* 26.5* 25.4*  MCV 93.2 94.0 93.1 92.0 92.4  PLT 317 271 245 237 242    INR: Recent Labs  Lab 04/26/20 0647 04/27/20 0600 04/28/20 0400 04/29/20 0500 04/30/20 0455  INR 1.0 1.0 1.2 1.2 1.2    Other results:  EKG:    Imaging   No results found.   Medications:     Scheduled Medications: . sodium chloride   Intravenous Once  . acetaminophen  1,000 mg Oral Q6H   Or  . acetaminophen (TYLENOL) oral liquid 160 mg/5 mL  1,000 mg Oral Q6H  . aspirin  81 mg Oral Daily  . bisacodyl  10 mg Oral Daily  . fentaNYL   Intravenous Q4H  . gabapentin  600 mg Oral QID  . hydrALAZINE  25 mg Oral Q8H  . insulin aspart  0-24 Units Subcutaneous TID AC & HS  . mupirocin cream    Topical BID  . pantoprazole  40 mg Oral Daily  . potassium chloride  20 mEq Oral Once  . rifabutin  300 mg Oral Daily  . senna-docusate  1 tablet Oral QHS  . sodium chloride flush  10-40 mL Intracatheter Q12H  . thiamine  100 mg Oral Daily  . varenicline  1 mg Oral Daily  . vitamin B-12  1,000 mcg Oral Daily  . Warfarin - Physician Dosing Inpatient   Does not apply q1600  . zinc sulfate  220 mg Oral BID    Infusions: . sodium chloride    . sodium chloride    . sodium chloride 500 mL (04/28/20 0827)  .  ceFAZolin (ANCEF) IV Stopped (04/30/20 0027)  . heparin 500 Units/hr (04/30/20 0900)  . norepinephrine (LEVOPHED) Adult infusion Stopped (04/19/20 1110)    PRN Medications: Place/Maintain arterial line **AND** sodium chloride, sodium chloride, sodium chloride, acetaminophen, bisacodyl, diphenhydrAMINE **OR** diphenhydrAMINE, fentaNYL (SUBLIMAZE) injection, fluticasone, hydrALAZINE, Ipratropium-Albuterol, naloxone **AND** sodium chloride flush, ondansetron (ZOFRAN) IV, ondansetron (ZOFRAN) IV, oxyCODONE, oxyCODONE, sodium chloride flush, sodium chloride flush, traMADol, zolpidem   Assessment/Plan:    1. 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 He previously required debridement of lower sternalwound with woundVAC therapy and IV antibiotics.This was completed with 3 surgical procedures including debridement and ACell application in March 2021.  More recently, the lower sternal wound reopened with drainage again culturing MSSA and he had been on cephalexin + doxycycline.  The day of admission, he developed persistent bleeding from the wound site and came to ER, bleeding now stopped with suture.  Bleeding again 5/30, now stopped again. Got an additional 1 units PRBCs on 5/31 and again 6/1. Overall he has received 3 units PRBCs + 5 units FFP.  ID consulted and switched antibiotics to cefazolin.  1/2 blood cultures + for MSSA. CT  chest/abd/pelvis--> Stable small amount of soft tissue tracking along the proximal drive line in the subcutaneous ventral left abdominal wall.  S/p I&D + wound vac placement on 6/1.   Back to OR for debridement and wound vac change on 6/4.  Back to OR  for rectus muscle flap to cover sternal wound + wound vac on 6/10.  - Wound vac site looks good. No drainage.  - Continue cefazolin IV + rifabutin. ID looking into cost of rifabutin.  - Continue heparin gtt  and warfarin. INR remains 1.2 Discussed dosing with PharmD personally. - I think we can start to wean PCA. Will d/w team. - Advance diet - Mobilize 2. COVID-19 infection: Asymptomatic. No cough/dyspnea.  Repeat COVID-19 test negative 6/5. - He is off isolation.    3. 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 LVAD parameters stable.  Has been NYHA class II.  MAPs low initially, started on phenylephrine (may have been at least partly vagal as very diaphoretic with bleeding).  Speed back up to 5700 rpm. MAPs recently low after restarting home meds  - Off Entresto, amlodipine and spiro. MAPs 80. Continue hydralazine 25 tid. Can use prn IV hydral for HTN - VAD interrogated personally. Parameters stable. 4. Smoking:He has cut back a lot, still taking Chantix. Encouraged to quit. 5. H/o RLE DVT: On warfarin for LVAD but held for wound exploration 6. OSA: Continue CPAP.  7. Hyperlipidemia: Atorvastatin.  8. Type II diabetes:SSI 9. Anemia, Acute blood loss: -  Hgb down, 10.4>>9.2>>8.4 > 7.1 > 8.4 -> 8.0 - Expected surgical blood loss. Monitor closely 10. Post-op ileus. - improving - advancing diet. Wean PCA 11. Hypokalemia/hypomag - supp   I reviewed the LVAD parameters from today, and compared the results to the patient's prior recorded data.  No programming changes were made.  The LVAD is functioning within specified parameters.  The patient performs LVAD self-test daily.  LVAD interrogation was  negative for any significant power changes, alarms or PI events/speed drops.  LVAD equipment check completed and is in good working order.  Back-up equipment present.   LVAD education done on emergency procedures and precautions and reviewed exit site care.  Length of Stay: 14  Glori Bickers MD 04/30/2020 10:20 AM  VAD Team --- VAD ISSUES ONLY--- Pager [phone] (7am - 7am)  Advanced Heart Failure Team  Pager [phone] (M-F; 7a - 4p)  Please contact [facility] for night-coverage after hours (4p -7a ) and weekends on [email or web address]

## 2020-05-01 LAB — CBC
HCT: 25.1 % — ABNORMAL LOW (ref 39.0–52.0)
Hemoglobin: 7.9 g/dL — ABNORMAL LOW (ref 13.0–17.0)
MCH: 29.5 pg (ref 26.0–34.0)
MCHC: 31.5 g/dL (ref 30.0–36.0)
MCV: 93.7 fL (ref 80.0–100.0)
Platelets: 295 10*3/uL (ref 150–400)
RBC: 2.68 MIL/uL — ABNORMAL LOW (ref 4.22–5.81)
RDW: 13.4 % (ref 11.5–15.5)
WBC: 8.9 10*3/uL (ref 4.0–10.5)
nRBC: 0 % (ref 0.0–0.2)

## 2020-05-01 LAB — GLUCOSE, CAPILLARY
Glucose-Capillary: 82 mg/dL (ref 70–99)
Glucose-Capillary: 81 mg/dL (ref 70–99)
Glucose-Capillary: 136 mg/dL — ABNORMAL HIGH (ref 70–99)

## 2020-05-01 LAB — BASIC METABOLIC PANEL
Anion gap: 7 (ref 5–15)
BUN: 5 mg/dL — ABNORMAL LOW (ref 8–23)
CO2: 24 mmol/L (ref 22–32)
Calcium: 8.7 mg/dL — ABNORMAL LOW (ref 8.9–10.3)
Chloride: 111 mmol/L (ref 98–111)
Creatinine, Ser: 0.74 mg/dL (ref 0.61–1.24)
GFR calc Af Amer: >60 mL/min (ref >60)
GFR calc non Af Amer: >60 mL/min (ref >60)
Glucose, Bld: 107 mg/dL — ABNORMAL HIGH (ref 70–99)
Potassium: 3.9 mmol/L (ref 3.5–5.1)
Sodium: 142 mmol/L (ref 135–145)

## 2020-05-01 LAB — LACTATE DEHYDROGENASE: LDH: 142 U/L (ref 98–192)

## 2020-05-01 LAB — PROTIME-INR
INR: 1.2 (ref 0.8–1.2)
Prothrombin Time: 14.7 seconds (ref 11.4–15.2)

## 2020-05-01 LAB — HEPARIN LEVEL (UNFRACTIONATED): Heparin Unfractionated: <0.1 IU/mL — ABNORMAL LOW (ref 0.30–0.70)

## 2020-05-01 MED ORDER — SPIRONOLACTONE 12.5 MG HALF TABLET
12.5000 mg | ORAL_TABLET | Freq: Every day | ORAL | Status: DC
  Administered 2020-05-01 – 2020-05-04 (×4): 12.5 mg via ORAL
  Filled 2020-05-01 (×4): qty 1

## 2020-05-01 MED ORDER — LOPERAMIDE HCL 2 MG PO CAPS
2.0000 mg | ORAL_CAPSULE | ORAL | Status: DC | PRN
  Administered 2020-05-01 (×2): 2 mg via ORAL
  Filled 2020-05-01 (×2): qty 1

## 2020-05-01 MED ORDER — WARFARIN SODIUM 7.5 MG PO TABS
7.5000 mg | ORAL_TABLET | Freq: Once | ORAL | Status: AC
  Administered 2020-05-01: 7.5 mg via ORAL
  Filled 2020-05-01: qty 1

## 2020-05-01 MED ORDER — FE FUMARATE-B12-VIT C-FA-IFC PO CAPS
1.0000 | ORAL_CAPSULE | Freq: Two times a day (BID) | ORAL | Status: DC
  Administered 2020-05-01 – 2020-05-06 (×10): 1 via ORAL
  Filled 2020-05-01 (×10): qty 1

## 2020-05-01 MED ORDER — WARFARIN - PHARMACIST DOSING INPATIENT
Freq: Every day | Status: DC
  Administered 2020-05-01 – 2020-05-02 (×2): 1

## 2020-05-01 NOTE — Progress Notes (Signed)
4 Days Post-Op Procedure(s) (LRB): STERNAL WOUND DEBRIDEMENT (N/A) MUSCLE FLAP TO STERNUM (N/A) APPLICATION OF A-CELL OF CHEST (N/A) APPLICATION OF PREVENA INCISIONAL WOUND VAC (N/A) Subjective: Making progress, JP drains with minimal output and skin flap compressed, Praveena sponge compressed;  Hemoglobin stable at 8.0, oral iron started We will stop PCA and increase heparin while Coumadin is being redosed  Objective: Vital signs in last 24 hours: Temp:  [98.2 F (36.8 C)-98.7 F (37.1 C)] 98.2 F (36.8 C) (06/14 0819) Pulse Rate:  [77-83] 80 (06/14 0819) Cardiac Rhythm: Normal sinus rhythm (06/14 0819) Resp:  [18-24] 18 (06/14 0819) BP: (88-104)/(71-86) 97/85 (06/14 0819) SpO2:  [100 %] 100 % (06/14 0819) Weight:  [89.2 kg] 89.2 kg (06/14 0500)  Hemodynamic parameters for last 24 hours: CVP:  [3 mmHg-7 mmHg] 5 mmHg  Intake/Output from previous day: 06/13 0701 - 06/14 0700 In: 1775.1 [P.O.:1400; I.V.:125.1; IV Piggyback:250] Out: 2427.5 [Urine:2375; Drains:52.5] Intake/Output this shift: Total I/O In: 455 [P.O.:240; I.V.:15; IV Piggyback:200] Out: 250 [Urine:250]       Exam    General- alert and comfortable    Neck- no JVD, no cervical adenopathy palpable, no carotid bruit   Lungs- clear without rales, wheezes   Cor- regular rate and rhythm, normal VAD hum , gallop   Abdomen- soft, non-tender   Extremities - warm, non-tender, minimal edema   Neuro- oriented, appropriate, no focal weakness   Lab Results: Recent Labs    04/30/20 0455 05/01/20 0500  WBC 9.2 8.9  HGB 8.0* 7.9*  HCT 25.4* 25.1*  PLT 242 295   BMET:  Recent Labs    04/30/20 0455 05/01/20 0500  NA 141 142  K 3.3* 3.9  CL 110 111  CO2 24 24  GLUCOSE 110* 107*  BUN 5* 5*  CREATININE 0.71 0.74  CALCIUM 8.7* 8.7*    PT/INR:  Recent Labs    05/01/20 0500  LABPROT 14.7  INR 1.2   ABG    Component Value Date/Time   PHART 7.478 (H) 04/26/2020 0523   HCO3 26.5 04/26/2020 0523   TCO2  27 04/19/2020 0832   ACIDBASEDEF 1.0 04/18/2020 1513   O2SAT 81.2 04/26/2020 0523   CBG (last 3)  Recent Labs    04/29/20 2057 04/30/20 0638 04/30/20 2212  GLUCAP 95 101* 129*    Assessment/Plan: S/P Procedure(s) (LRB): STERNAL WOUND DEBRIDEMENT (N/A) MUSCLE FLAP TO STERNUM (N/A) APPLICATION OF A-CELL OF CHEST (N/A) APPLICATION OF PREVENA INCISIONAL WOUND VAC (N/A) Mobilize Diuresis DC PCA   LOS: 15 days    Theodore Welch 05/01/2020

## 2020-05-01 NOTE — Progress Notes (Signed)
4 Days Post-Op  Subjective: Doing well today, reports he has been feeling better after this weekend. No more diarrhea.  No f/c/sob/cp. Reports he has been using IS and this has been helpful with congestion. No abdominal pain, denies feeling of distension.  Reports he is ready to begin ambulating and would like to be home for father's day.  Objective: Vital signs in last 24 hours: Temp:  [98.2 F (36.8 C)-98.7 F (37.1 C)] 98.2 F (36.8 C) (06/14 0819) Pulse Rate:  [77-83] 80 (06/14 0819) Resp:  [18-23] 18 (06/14 0819) BP: (91-104)/(73-86) 97/85 (06/14 0819) SpO2:  [100 %] 100 % (06/14 0819) Weight:  [89.2 kg] 89.2 kg (06/14 0500) Last BM Date: 05/01/20  Intake/Output from previous day: 06/13 0701 - 06/14 0700 In: 1775.1 [P.O.:1400; I.V.:125.1; IV Piggyback:250] Out: 2427.5 [Urine:2375; Drains:52.5] Intake/Output this shift: Total I/O In: 455 [P.O.:240; I.V.:15; IV Piggyback:200] Out: 250 [Urine:250]  General appearance: alert, cooperative, no distress and sitting up in bedside chair, eating lunch Head: Normocephalic, without obvious abnormality, atraumatic Chest wall: Prevena vac in place - good seal noted - no drainage in canister., minimal TTP. GI: soft, non-tender, 2 JP drains in place, dressing in place over LVAD insertion site.  Extremities:no edema - compression stockings on b/l LE Neurologic: Grossly normal   Lab Results:  CBC Latest Ref Rng & Units 05/01/2020 04/30/2020 04/29/2020  WBC 4.0 - 10.5 K/uL 8.9 9.2 12.4(H)  Hemoglobin 13.0 - 17.0 g/dL 7.9(L) 8.0(L) 8.4(L)  Hematocrit 39 - 52 % 25.1(L) 25.4(L) 26.5(L)  Platelets 150 - 400 K/uL 295 242 237    BMET Recent Labs    04/30/20 0455 05/01/20 0500  NA 141 142  K 3.3* 3.9  CL 110 111  CO2 24 24  GLUCOSE 110* 107*  BUN 5* 5*  CREATININE 0.71 0.74  CALCIUM 8.7* 8.7*   PT/INR Recent Labs    04/30/20 0455 05/01/20 0500  LABPROT 14.6 14.7  INR 1.2 1.2   ABG No results for input(s): PHART, HCO3 in  the last 72 hours.  Invalid input(s): PCO2, PO2  Studies/Results: No results found.  Anti-infectives: Anti-infectives (From admission, onward)   Start     Dose/Rate Route Frequency Ordered Stop   04/27/20 0911  vancomycin (VANCOCIN) 1,000 mg in sodium chloride 0.9 % 1,000 mL irrigation  Status:  Discontinued          As needed 04/27/20 0911 04/27/20 1230   04/27/20 0815  ceFAZolin (ANCEF) IVPB 2g/100 mL premix  Status:  Discontinued        2 g 200 mL/hr over 30 Minutes Intravenous On call to O.R. 04/27/20 0809 04/27/20 0812   04/27/20 0800  ceFAZolin (ANCEF) IVPB 2g/100 mL premix        2 g 200 mL/hr over 30 Minutes Intravenous To ShortStay Surgical 04/26/20 0827 04/27/20 0630   04/25/20 1530  rifabutin (MYCOBUTIN) capsule 300 mg     Discontinue     300 mg Oral Daily 04/25/20 1529     04/21/20 1506  vancomycin (VANCOCIN) 1,000 mg in sodium chloride 0.9 % 1,000 mL irrigation  Status:  Discontinued          As needed 04/21/20 1506 04/21/20 1649   04/21/20 1300  ceFAZolin (ANCEF) IVPB 2g/100 mL premix        2 g 200 mL/hr over 30 Minutes Intravenous To ShortStay Surgical 04/20/20 0834 04/21/20 1557   04/18/20 1400  vancomycin (VANCOREADY) IVPB 1500 mg/300 mL  Status:  Discontinued  1,500 mg 150 mL/hr over 120 Minutes Intravenous To Surgery 04/18/20 1348 04/18/20 1639   04/18/20 1127  vancomycin (VANCOCIN) 1,000 mg in sodium chloride 0.9 % 1,000 mL irrigation  Status:  Discontinued          As needed 04/18/20 1128 04/18/20 1631   04/16/20 1700  vancomycin (VANCOREADY) IVPB 1250 mg/250 mL  Status:  Discontinued        1,250 mg 166.7 mL/hr over 90 Minutes Intravenous Every 12 hours 04/16/20 0746 04/16/20 1241   04/16/20 1700  ceFAZolin (ANCEF) IVPB 2g/100 mL premix     Discontinue     2 g 200 mL/hr over 30 Minutes Intravenous Every 8 hours 04/16/20 1247     04/16/20 0600  piperacillin-tazobactam (ZOSYN) IVPB 3.375 g  Status:  Discontinued        3.375 g 12.5 mL/hr over 240  Minutes Intravenous Every 6 hours 04/16/20 0516 04/16/20 0535   04/16/20 0600  piperacillin-tazobactam (ZOSYN) IVPB 3.375 g  Status:  Discontinued        3.375 g 12.5 mL/hr over 240 Minutes Intravenous Every 8 hours 04/16/20 0536 04/16/20 1241   04/16/20 0530  vancomycin (VANCOCIN) IVPB 1000 mg/200 mL premix  Status:  Discontinued        1,000 mg 200 mL/hr over 60 Minutes Intravenous Every 12 hours 04/16/20 0516 04/16/20 0535   04/16/20 0430  vancomycin (VANCOREADY) IVPB 1750 mg/350 mL        1,750 mg 175 mL/hr over 120 Minutes Intravenous  Once 04/16/20 0429 04/16/20 0742      Assessment/Plan: s/p Procedure(s): STERNAL WOUND DEBRIDEMENT MUSCLE FLAP TO STERNUM APPLICATION OF A-CELL OF CHEST APPLICATION OF PREVENA INCISIONAL WOUND VAC  Continue to monitor JP drains - doing well Prevena vac in place - plan to remove later this week     LOS: 15 days    Leslee Home, PA-C 05/01/2020

## 2020-05-01 NOTE — Progress Notes (Signed)
ANTICOAGULATION CONSULT NOTE   Pharmacy Consult for heparin - per TCTS dosing, warfarin Indication: LVAD  Allergies  Allergen Reactions  . Chlorhexidine Rash  . Other Rash    Prep pads    Patient Measurements: Height: 5\' 11"  (180.3 cm) Weight: 89.2 kg (196 lb 10.4 oz) IBW/kg (Calculated) : 75.3 Heparin Dosing Weight: ~ 90 kg  Vital Signs: Temp: 98.2 F (36.8 C) (06/14 0819) Temp Source: Oral (06/14 0819) BP: 97/85 (06/14 0819) Pulse Rate: 80 (06/14 0819)  Labs: Recent Labs    04/29/20 0500 04/29/20 0500 04/30/20 0455 05/01/20 0500  HGB 8.4*   < > 8.0* 7.9*  HCT 26.5*  --  25.4* 25.1*  PLT 237  --  242 295  LABPROT 14.6  --  14.6 14.7  INR 1.2  --  1.2 1.2  HEPARINUNFRC <0.10*  --  <0.10* <0.10*  CREATININE 0.78  --  0.71 0.74   < > = values in this interval not displayed.    Estimated Creatinine Clearance: 103.3 mL/min (by C-G formula based on SCr of 0.74 mg/dL).   Medical History: Past Medical History:  Diagnosis Date  . 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 Cardiomyopathy, unspecified (HCC)   . CHF (congestive heart failure) (HCC)   . Chronic back pain   . Diabetes mellitus without complication (HCC)   . Enlarged heart   . Gastric ulcer   . Gastroenteritis   . H/O degenerative disc disease   . Hypertension   . LVAD (left ventricular assist device) present (HCC)     Medications:   Infusions:  . sodium chloride    . sodium chloride    . sodium chloride 500 mL (04/28/20 0827)  .  ceFAZolin (ANCEF) IV 2 g (05/01/20 0622)  . heparin 500 Units/hr (05/01/20 0600)    Assessment: 59 yo male with LVAD - HM3 + ASA 81mg  daily, MSSA bacteremia and  driveline infection.  Chronic Coumadin has been on hold for procedures - flap 6/10.    PTA dose of warfarin: 7.5 mg MWF, 5 mg all other days   Underwent sternal wound debridement with flap and wound VAC application on 6/10. Warfarin restarted on 6/11 per MD. Discussed with Dr 8/10 today, plan to increase  heparin infusion to 700 units/hr without any titration. INR today is 1.2 - pharmacy to manage warfarin dosing. Hgb 7.9, plt 295. No s/sx of bleeding.   Goal of Therapy:  INR: 2-2.5  Monitor platelets by anticoagulation protocol: Yes   Plan:  Increase heparin infusion to 700 units/hr (titration per discussion with MD) Warfarin 7.5 tonight as per PTA regimen Will get AM heparin labs (no titration)  Monitor daily INR  8/11, PharmD, BCCCP Clinical Pharmacist  Phone: [phone] 05/01/2020 9:59 AM  Please check AMION for all [facility] phone numbers After 10:00 PM, call Main Pharmacy [phone]

## 2020-05-01 NOTE — Plan of Care (Signed)
  Problem: Clinical Measurements: Goal: Ability to maintain clinical measurements within normal limits will improve 05/01/2020 2123 by Rema Fendt, RN Outcome: Progressing 05/01/2020 2118 by Rema Fendt, RN Outcome: Progressing Goal: Will remain free from infection 05/01/2020 2123 by Rema Fendt, RN Outcome: Progressing 05/01/2020 2118 by Rema Fendt, RN Outcome: Progressing Goal: Diagnostic test results will improve 05/01/2020 2123 by Rema Fendt, RN Outcome: Progressing 05/01/2020 2118 by Rema Fendt, RN Outcome: Progressing Goal: Respiratory complications will improve 05/01/2020 2123 by Rema Fendt, RN Outcome: Progressing 05/01/2020 2118 by Rema Fendt, RN Outcome: Progressing Goal: Cardiovascular complication will be avoided 05/01/2020 2123 by Rema Fendt, RN Outcome: Progressing 05/01/2020 2118 by Rema Fendt, RN Outcome: Progressing

## 2020-05-01 NOTE — Progress Notes (Addendum)
Patient ID: Theodore Welch, male   DOB: [birth year], 59 y.o.   MRN: [id number]   Advanced Heart Failure VAD Team Note  PCP-Cardiologist: No primary care provider on file.   Subjective:    Events: - Admitted w/ subxiphoid abscess and cellulitis + bleeding from wound site. Admit Hgb 7.4. COVID +.   Speed increased back to 5700.  - S/p multiple transfusions. Has received total of 3 units PRBCs and 5 units FFP.  - Blood cultures 1/2 positive for MSSA.  On cefazolin and rifabutin added .   - Went to OR 6/1 for I&D of chest wound + repair of VAD outflow graft. Required fem-fem cardiopulmonary bypass. Wound vac placed. Back to OR 6/4 for debridement and wound vac change.  - Back to OR 6/10 for rectus muscle flap over wound and wound VAC.   Having multiple bowel movements. (on daily dulcolax+ senna). Complaining of cough. Denies SOB    LVAD INTERROGATION:  HeartMate III LVAD:   Flow 4.5  liters/min, speed 5700, power 4  PI 4 multiple VAD interrogated personally. Parameters stable.  Objective:    Vital Signs:   Temp:  [98.2 F (36.8 C)-98.7 F (37.1 C)] 98.5 F (36.9 C) (06/14 0535) Pulse Rate:  [77-83] 77 (06/13 2320) Resp:  [19-30] 19 (06/14 0000) BP: (88-104)/(71-86) 103/82 (06/14 0617) SpO2:  [100 %] 100 % (06/13 2320) Weight:  [89.2 kg] 89.2 kg (06/14 0500) Last BM Date: 05/01/20 Mean arterial Pressure 80s  Intake/Output:   Intake/Output Summary (Last 24 hours) at 05/01/2020 0717 Last data filed at 05/01/2020 0600 Gross per 24 hour  Intake 1775.05 ml  Output 2427.5 ml  Net -652.45 ml     Physical Exam   CVP 7-8  Physical Exam: GENERAL: No acute distress. HEENT: normal  NECK: Supple, JVP 7-8   Carotids 2+ bilaterally, no bruits.  No lymphadenopathy or thyromegaly appreciated.   CARDIAC:  Mechanical heart sounds with LVAD hum present.  LUNGS:  Coarse throughout.  ABDOMEN:  Soft, round, nontender, positive bowel sounds x4.     LVAD exit site:  Dressing dry and intact.  No  erythema or drainage.  Stabilization device present and accurately applied.  Driveline dressing is being changed daily per sterile technique. EXTREMITIES:  Warm and dry, no cyanosis, clubbing, rash or edema . RUE PICC NEUROLOGIC:  Alert and oriented x 3.    No aphasia.  No dysarthria.  Affect pleasant.    Skin : VAC dressing in place. Sternal wound.     Telemetry  NSR 80s personally reviewed.    Labs   Basic Metabolic Panel: Recent Labs  Lab 04/27/20 0600 04/27/20 0600 04/28/20 0400 04/28/20 0400 04/29/20 0500 04/30/20 0455 04/30/20 0547 05/01/20 0500  NA 141  --  137  --  140 141  --  142  K 3.9  --  3.9  --  3.7 3.3*  --  3.9  CL 106  --  106  --  107 110  --  111  CO2 27  --  25  --  25 24  --  24  GLUCOSE 138*  --  127*  --  126* 110*  --  107*  BUN 6*  --  8  --  6* 5*  --  5*  CREATININE 0.74  --  0.82  --  0.78 0.71  --  0.74  CALCIUM 9.3   < > 9.1   < > 8.9 8.7*  --  8.7*  MG  --   --   --   --   --   --  1.6*  --    < > = values in this interval not displayed.    Liver Function Tests: Recent Labs  Lab 04/26/20 0647 04/29/20 0500  AST 15 11*  ALT 10 7  ALKPHOS 76 61  BILITOT 0.6 0.7  PROT 6.0* 5.7*  ALBUMIN 2.9* 2.8*   No results for input(s): LIPASE, AMYLASE in the last 168 hours. No results for input(s): AMMONIA in the last 168 hours.  CBC: Recent Labs  Lab 04/28/20 0400 04/28/20 1844 04/29/20 0500 04/30/20 0455 05/01/20 0500  WBC 13.3* 13.7* 12.4* 9.2 8.9  HGB 8.4* 7.6* 8.4* 8.0* 7.9*  HCT 26.7* 24.1* 26.5* 25.4* 25.1*  MCV 94.0 93.1 92.0 92.4 93.7  PLT 271 245 237 242 295    INR: Recent Labs  Lab 04/27/20 0600 04/28/20 0400 04/29/20 0500 04/30/20 0455 05/01/20 0500  INR 1.0 1.2 1.2 1.2 1.2    Other results:  EKG:    Imaging   No results found.   Medications:     Scheduled Medications: . sodium chloride   Intravenous Once  . acetaminophen  1,000 mg Oral Q6H   Or  . acetaminophen (TYLENOL) oral liquid 160 mg/5 mL   1,000 mg Oral Q6H  . aspirin  81 mg Oral Daily  . bisacodyl  10 mg Oral Daily  . fentaNYL   Intravenous Q4H  . gabapentin  600 mg Oral QID  . hydrALAZINE  25 mg Oral Q8H  . insulin aspart  0-24 Units Subcutaneous TID AC & HS  . mupirocin cream   Topical BID  . pantoprazole  40 mg Oral Daily  . rifabutin  300 mg Oral Daily  . senna-docusate  1 tablet Oral QHS  . sodium chloride flush  10-40 mL Intracatheter Q12H  . thiamine  100 mg Oral Daily  . varenicline  1 mg Oral Daily  . vitamin B-12  1,000 mcg Oral Daily  . Warfarin - Physician Dosing Inpatient   Does not apply q1600  . zinc sulfate  220 mg Oral BID    Infusions: . sodium chloride    . sodium chloride    . sodium chloride 500 mL (04/28/20 0827)  .  ceFAZolin (ANCEF) IV 2 g (05/01/20 0622)  . heparin 500 Units/hr (05/01/20 0600)  . norepinephrine (LEVOPHED) Adult infusion Stopped (04/19/20 1110)    PRN Medications: Place/Maintain arterial line **AND** sodium chloride, sodium chloride, sodium chloride, acetaminophen, bisacodyl, diphenhydrAMINE **OR** diphenhydrAMINE, fluticasone, hydrALAZINE, Ipratropium-Albuterol, naloxone **AND** sodium chloride flush, ondansetron (ZOFRAN) IV, ondansetron (ZOFRAN) IV, oxyCODONE, oxyCODONE, sodium chloride flush, sodium chloride flush, traMADol, zolpidem   Assessment/Plan:    1. 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 He previously required debridement of lower sternalwound with woundVAC therapy and IV antibiotics.This was completed with 3 surgical procedures including debridement and ACell application in March 2021.  More recently, the lower sternal wound reopened with drainage again culturing MSSA and he had been on cephalexin + doxycycline.  The day of admission, he developed persistent bleeding from the wound site and came to ER, bleeding now stopped with suture.  Bleeding again 5/30, now stopped again. Got an additional 1  units PRBCs on 5/31 and again 6/1. Overall he has received 3 units PRBCs + 5 units FFP.  ID consulted and switched antibiotics to cefazolin.  1/2 blood cultures + for MSSA. CT chest/abd/pelvis--> Stable small amount of soft tissue tracking along the proximal drive line in the subcutaneous ventral left abdominal wall.  S/p I&D + wound vac placement on 6/1.   Back to OR for debridement and wound vac change on 6/4. - Back to OR for rectus muscle flap to cover sternal wound + wound vac on 6/10.  - Wound vac site looks good. No drainage.  - Continue cefazolin IV + rifabutin. ID looking into cost of rifabutin.  - Continue heparin gtt  and warfarin. INR 1.2  -Discussed dosing with PharmD personally. - Stop daily dulcolax and senna.  - Add incentive spirometer.  2. COVID-19 infection: Asymptomatic. No cough/dyspnea.  Repeat COVID-19 test negative 6/5. - He is off isolation.    3. 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 LVAD parameters stable. MAPs low initially, started on phenylephrine (may have been at least partly vagal as very diaphoretic with bleeding).  Speed back up to 5700 rpm. MAPs recently low after restarting home meds  - Volume status stable. CVP 7-8   - Maps in the 80s,  - Off Entresto, amlodipine.  Add 12.5 mg spiro daily.   -Continue hydralazine 25 tid. Can use prn IV hydral for HTN - VAD interrogated personally. Parameters stable. 4. Smoking:He has cut back a lot, still taking Chantix. Encouraged to quit. 5. H/o RLE DVT: On warfarin for LVAD but held for wound exploration 6. OSA: Continue CPAP.  7. Hyperlipidemia: Atorvastatin.  8. Type II diabetes:SSI 9. Anemia, Acute blood loss: -  Hgb down, 10.4>>9.2>>8.4 > 7.1 > 8.4 -> 8.0->7.9  - Transfuse if Hgb< 7.5  - Expected surgical blood loss. Monitor closely 10. Post-op ileus. - improving - advancing diet. Wean PCA 11. Hypokalemia/hypomag - Check Mag in am.   -Add spiro 12.5 mg  daily. BMET in am  - Needs to use IS every hour.  - Consult PT.    I reviewed the LVAD parameters from today, and compared the results to the patient's prior recorded data.  No programming changes were made.  The LVAD is functioning within specified parameters.  The patient performs LVAD self-test daily.  LVAD interrogation was negative for any significant power changes, alarms or PI events/speed drops.  LVAD equipment check completed and is in good working order.  Back-up equipment present.   LVAD education done on emergency procedures and precautions and reviewed exit site care.  Length of Stay: Dickinson NP-C  05/01/2020 7:17 AM  VAD Team --- VAD ISSUES ONLY--- Pager [phone] (7am - 7am)  Advanced Heart Failure Team  Pager [phone] (M-F; 7a - 4p)  Please contact [facility] for night-coverage after hours (4p -7a ) and weekends on [email or web address]   Patient seen and examined with Darrick Grinder, NP. We discussed all aspects of the encounter. I agree with the assessment and plan as stated above.   Continues to progress. Site looks good. Continues to use PCA. Having frequent BMs. VAD parameters stable. INR still 1.2. On heparin. No bleeding  General:  Sitting up. NAD.  HEENT: normal  Neck: supple. JVP not elevated.  Carotids 2+ bilat; no bruits. No lymphadenopathy or thryomegaly appreciated. Cor: LVAD hum.  Lungs: Clear. Abdomen: obese soft, nontender, non-distended. Wound vac and dressing ok.  No hepatosplenomegaly. No bruits or masses. Good bowel sounds. Driveline site clean. Anchor in place.  Extremities: no cyanosis, clubbing, rash. Warm no edema  Neuro: alert & oriented x 3. No focal deficits. Moves all 4 without problem   Continues to progress. Agree with stopping laxatives. Wean PCA. Management of wound VAC per Dr. PVT.  INR remains low. Discussed dosing with PharmD personally. Continue heparin. Mobilize.   Arvilla Meres, MD  8:36 AM

## 2020-05-01 NOTE — Progress Notes (Signed)
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   HM III LVAD implanted on 08/03/18 by Dr. Laneta Simmers under Destination Therapy criteria due to smoking status.  Pt up in the chair this morning, states that he feels very tired this morning and has had diarrhea most of the weekend.   Vital signs: Temp: 98.2 HR: 85 Doppler Pressure: 100 Auto cuff: 97/85 (90) O2 Sat: 100% on RA Wt: 197>189>189>203.7>208.7>207.6>200.8>200.6>200.6>193.5>194.4>196.6 lbs   LVAD interrogation reveals:  Speed: 5700 Flow: 4.6 Power: 4.3w PI: 3.3  Alarms: none Events: 10 PI so far today; 90+ PI yesterday Hematocrit: 25  Fixed speed: 5700 Low speed limit: 5400  Drive Line: Daily dressing change per bedside RN starting 04/29/20. Clean with betadine, no skin prep (skin intolerance). Take down outer portion of dressing. Leave Mepitel dressing in place. Cover with sterile gel and saline moistened 4 x 4 and sterile VAD dressing. RN to perform today. Continue daily.   Labs:  LDH trend: 182>194>140>252>191>162>160>168>159>168>153>142  INR trend: 2.5>1.9>1.5>1.4>1.3>1.2>1.1>1.0>1.0>1.0>1.0>1.2  Anticoagulation Plan: -INR Goal: 2.0 - 2.5 - warfarin on hold -ASA Dose: 81 mg daily - on hold  Blood Products:  - 04/16/20>FFP x 2; PRBC x 1 - 04/17/20>>FFP x 1; PRBC x 1  Intra-op: - 04/18/20>>PRBC x 6; Plts x 2; FFP x 2; Cell saver 2330 ml  Device: N/A  Respiratory: intubated intra op 04/18/20 - extubated 04/19/20 am   Infection:  - 04/16/20>>Covid positive - 04/16/20>>BCs staph aureus - 04/18/20>>sternal wound culture intra-op>>staph aureus - 04/20/20>>BCs pending - 04/22/20>> COVID negative  Gtts: - Heparin 500 units/hr   Plan/Recommendations:  1. Call VAD Coordinator if any VAD equipment or drive line issues. 2. Daily drive line dressing change per bedside RN   [name] RN VAD Coordinator  Office: [phone]  24/7 Pager: [phone]

## 2020-05-02 LAB — PROTIME-INR
INR: 1.1 (ref 0.8–1.2)
Prothrombin Time: 14.1 seconds (ref 11.4–15.2)

## 2020-05-02 LAB — GLUCOSE, CAPILLARY
Glucose-Capillary: 228 mg/dL — ABNORMAL HIGH (ref 70–99)
Glucose-Capillary: 91 mg/dL (ref 70–99)
Glucose-Capillary: 103 mg/dL — ABNORMAL HIGH (ref 70–99)
Glucose-Capillary: 81 mg/dL (ref 70–99)

## 2020-05-02 LAB — CBC
HCT: 24.9 % — ABNORMAL LOW (ref 39.0–52.0)
Hemoglobin: 7.8 g/dL — ABNORMAL LOW (ref 13.0–17.0)
MCH: 29.1 pg (ref 26.0–34.0)
MCHC: 31.3 g/dL (ref 30.0–36.0)
MCV: 92.9 fL (ref 80.0–100.0)
Platelets: 310 10*3/uL (ref 150–400)
RBC: 2.68 MIL/uL — ABNORMAL LOW (ref 4.22–5.81)
RDW: 13.4 % (ref 11.5–15.5)
WBC: 9.4 10*3/uL (ref 4.0–10.5)
nRBC: 0 % (ref 0.0–0.2)

## 2020-05-02 LAB — BASIC METABOLIC PANEL
Anion gap: 8 (ref 5–15)
BUN: 7 mg/dL — ABNORMAL LOW (ref 8–23)
CO2: 23 mmol/L (ref 22–32)
Calcium: 8.7 mg/dL — ABNORMAL LOW (ref 8.9–10.3)
Chloride: 112 mmol/L — ABNORMAL HIGH (ref 98–111)
Creatinine, Ser: 0.69 mg/dL (ref 0.61–1.24)
GFR calc Af Amer: >60 mL/min (ref >60)
GFR calc non Af Amer: >60 mL/min (ref >60)
Glucose, Bld: 108 mg/dL — ABNORMAL HIGH (ref 70–99)
Potassium: 3.6 mmol/L (ref 3.5–5.1)
Sodium: 143 mmol/L (ref 135–145)

## 2020-05-02 LAB — LACTATE DEHYDROGENASE: LDH: 158 U/L (ref 98–192)

## 2020-05-02 LAB — MAGNESIUM: Magnesium: 1.8 mg/dL (ref 1.7–2.4)

## 2020-05-02 LAB — HEPARIN LEVEL (UNFRACTIONATED): Heparin Unfractionated: <0.1 IU/mL — ABNORMAL LOW (ref 0.30–0.70)

## 2020-05-02 MED ORDER — MAGNESIUM SULFATE 2 GM/50ML IV SOLN
2.0000 g | Freq: Once | INTRAVENOUS | Status: AC
  Administered 2020-05-02: 2 g via INTRAVENOUS
  Filled 2020-05-02: qty 50

## 2020-05-02 MED ORDER — SACUBITRIL-VALSARTAN 24-26 MG PO TABS
1.0000 | ORAL_TABLET | Freq: Two times a day (BID) | ORAL | Status: DC
  Administered 2020-05-02 – 2020-05-05 (×7): 1 via ORAL
  Filled 2020-05-02 (×7): qty 1

## 2020-05-02 MED ORDER — WARFARIN SODIUM 5 MG PO TABS
5.0000 mg | ORAL_TABLET | Freq: Once | ORAL | Status: AC
  Administered 2020-05-02: 5 mg via ORAL
  Filled 2020-05-02: qty 1

## 2020-05-02 MED FILL — Heparin Sodium (Porcine) Inj 1000 Unit/ML: INTRAMUSCULAR | Qty: 30 | Status: AC

## 2020-05-02 MED FILL — Sodium Chloride IV Soln 0.9%: INTRAVENOUS | Qty: 2000 | Status: AC

## 2020-05-02 NOTE — Progress Notes (Signed)
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   HM III LVAD implanted on 08/03/18 by Dr. Laneta Simmers under Destination Therapy criteria due to smoking status.  Pt up in the chair this morning, states that he feels much better and has been walking around the unit.  Surgeon is recommending IV Ancef for 2 weeks post discharge.    Vital signs: Temp: 98.3 HR: 72 Doppler Pressure: 118 Auto cuff: 118/99 (107) O2 Sat: 100% on RA Wt: 197>189>189>203.7>208.7>207.6>200.8>200.6>200.6>193.5>194.4>196.6>197 lbs   LVAD interrogation reveals:  Speed: 5700 Flow: 4.5 Power: 4.4w PI: 3.8  Alarms: none Events: 10 PI so far today; 90+ PI yesterday Hematocrit: 25  Fixed speed: 5700 Low speed limit: 5400  Drive Line: Daily dressing change per bedside RN starting 04/29/20. Clean with betadine, no skin prep (skin intolerance). Take down outer portion of dressing. Leave Mepitel dressing in place. Cover with sterile gel and saline moistened 4 x 4 and sterile VAD dressing. RN to perform today. Continue daily.   Labs:  LDH trend: 182>194>140>252>191>162>160>168>159>168>153>142>158  INR trend: 2.5>1.9>1.5>1.4>1.3>1.2>1.1>1.0>1.0>1.0>1.0>1.2>1.1  Anticoagulation Plan: -INR Goal: 2.0 - 2.5 - warfarin on hold -ASA Dose: 81 mg daily - on hold  Blood Products:  - 04/16/20>FFP x 2; PRBC x 1 - 04/17/20>>FFP x 1; PRBC x 1  Intra-op: - 04/18/20>>PRBC x 6; Plts x 2; FFP x 2; Cell saver 2330 ml  Device: N/A  Respiratory: intubated intra op 04/18/20 - extubated 04/19/20 am   Infection:  - 04/16/20>>Covid positive - 04/16/20>>BCs staph aureus - 04/18/20>>sternal wound culture intra-op>>staph aureus - 04/20/20>>BCs negative  - 04/22/20>> COVID negative  Gtts: - Heparin 800 units/hr   Plan/Recommendations:  1. Call VAD Coordinator if any VAD equipment or drive line issues. 2. Daily drive line dressing change per bedside RN   [name] RN VAD  Coordinator  Office: [phone]  24/7 Pager: [phone]

## 2020-05-02 NOTE — Progress Notes (Signed)
PT Cancellation Note  Patient Details Name: Theodore Welch MRN: [id number] DOB: 08-09-1961   Cancelled Treatment:    Reason Eval/Treat Not Completed: PT screened, no needs identified, will sign off. Pt amb independently in room and on unit with steady gait.   Angelina Ok [facility] 05/02/2020, 9:14 AM Skip Mayer PT Acute Rehabilitation Services Pager [phone] Office [phone]

## 2020-05-02 NOTE — TOC Initial Note (Addendum)
Transition of Care [facility]) - Initial/Assessment Note    Patient Details  Name: Theodore Welch MRN: [id number] Date of Birth: [birth year]  Transition of Care [facility]) CM/SW Contact:    Carles Collet, RN Phone Number: 05/02/2020, 3:49 PM  Clinical Narrative:           Damaris Schooner w patient. He states that he understands he will probably be going home at the end of the week, when his INR is in range. He is familiar with home health and IV abx.  Requesting [place] and Pam w [facility]. Notified Pam that patient will need 2 weeks ancef (per latest note) and Durene Fruits [facility]. Butch Penny is checking nurse availability. Patient is agreeable to another [place] if [facility] is not available.       [facility] accepted case       Expected Discharge Plan: [place] Barriers to Discharge: Continued Medical Work up   Patient Goals and CMS Choice Patient states their goals for this hospitalization and ongoing recovery are:: to go home CMS Medicare.gov Compare Post Acute Care list provided to:: Patient Choice offered to / list presented to : Patient  Expected Discharge Plan and Services Expected Discharge Plan: [place]   Discharge Planning Services: CM Consult Post Acute Care Choice: Home Health, Durable Medical Equipment Living arrangements for the past 2 months: [place] Arranged: RN, IV Antibiotics HH Agency: Tour manager, [place] ([facility]) Date [place]: 05/02/20 Time [place]: [place] Representative spoke with at [place]: Floy Sabina  Prior Living Arrangements/Services Living arrangements for the past 2 months: [place] with:: Friends Patient language and need for interpreter reviewed:: Yes Do you feel safe going back to the place where you live?: Yes      Need for Family Participation in Patient Care: Yes (Comment) Care giver support system in place?: Yes (comment)   Criminal  Activity/Legal Involvement Pertinent to Current Situation/Hospitalization: No - Comment as needed  Activities of Daily Living Home Assistive Devices/Equipment: Environmental consultant (specify type), Other (Comment) (vad equipment) ADL Screening (condition at time of admission) Patient's cognitive ability adequate to safely complete daily activities?: Yes Is the patient deaf or have difficulty hearing?: No Does the patient have difficulty seeing, even when wearing glasses/contacts?: No Does the patient have difficulty concentrating, remembering, or making decisions?: No Patient able to express need for assistance with ADLs?: Yes Does the patient have difficulty dressing or bathing?: No Independently performs ADLs?: Yes (appropriate for developmental age) Does the patient have difficulty walking or climbing stairs?: No Weakness of Legs: None Weakness of Arms/Hands: None  Permission Sought/Granted                  Emotional Assessment   Attitude/Demeanor/Rapport: Engaged Affect (typically observed): Appropriate Orientation: : Oriented to Situation, Oriented to  Time, Oriented to Place, Oriented to Self Alcohol / Substance Use: Not Applicable Psych Involvement: No (comment)  Admission diagnosis:  Shock ([place]) [R57.9] LVAD (left ventricular assist device) present ([place]) [Z95.811] Bleeding from wound [T14.8XXA] Infection associated with driveline of left ventricular assist device (LVAD) ([place]) [U27.7XXA] Patient Active Problem List   Diagnosis Date Noted  . Acute on chronic combined systolic and diastolic CHF (congestive heart failure) ([place])   . Disseminated zoster 02/12/2020  . Abscess of sternal region  01/27/2020  . Staph aureus infection 01/27/2020  . Vaccine counseling 08/12/2019  . 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 Chronic systolic (congestive) heart failure (HCC) 11/28/2018  . Complication involving left ventricular assist  device (LVAD) 11/23/2018  . LVAD (left ventricular assist device) present (HCC) 08/03/2018  . DVT (deep venous thrombosis) (HCC) 08/02/2018  . Advance care planning   . Ascites   . COPD (chronic obstructive pulmonary disease) (HCC) 07/21/2018  . HTN (hypertension) 07/21/2018  . Tobacco use 07/21/2018  . Congestive heart failure (HCC) 07/21/2018  . CHF (congestive heart failure) (HCC) 07/21/2018  . Type 2 diabetes mellitus (HCC) 07/21/2018  . Hyperlipidemia 07/21/2018  . Fibromyalgia syndrome 01/27/2017  . Myofascial pain syndrome, cervical 01/27/2017  . Lumbar radiculopathy 01/24/2014  . History of lumbar laminectomy for spinal cord decompression 12/07/2013  . 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   PCP:  [name], MD Pharmacy:   [facility] [address], [place] - [address] [place] [address] Phone: [phone] Fax: [phone]  [facility] [place], [place] - [address] [place] [address] Phone: [phone] Fax: [phone]     Social Determinants of Health (SDOH) Interventions    Readmission Risk Interventions Readmission Risk Prevention Plan 04/25/2020 02/14/2020 04/05/2019  Transportation Screening Complete Complete -  PCP or Specialist Appt within 3-5 Days - - Not Complete  Not Complete comments - - earliest appt given with HF/LVAD team  HRI or Home Care Consult Complete - -  Social Work Consult for Recovery Care Planning/Counseling Complete - -  Palliative Care Screening Not Applicable - -  Medication Review (RN Care Manager) Complete - -  Some recent data might be hidden

## 2020-05-02 NOTE — Plan of Care (Signed)
  Problem: Clinical Measurements: Goal: Ability to maintain clinical measurements within normal limits will improve Outcome: Progressing Goal: Will remain free from infection Outcome: Progressing Goal: Diagnostic test results will improve Outcome: Progressing Goal: Cardiovascular complication will be avoided Outcome: Progressing   Problem: Elimination: Goal: Will not experience complications related to urinary retention Outcome: Progressing   Problem: Safety: Goal: Ability to remain free from injury will improve Outcome: Progressing   Problem: Skin Integrity: Goal: Risk for impaired skin integrity will decrease Outcome: Progressing   

## 2020-05-02 NOTE — Progress Notes (Addendum)
Patient ID: Theodore Welch, male   DOB: 12/29/1960, 59 y.o.   MRN: [id number]   Advanced Heart Failure VAD Team Note  PCP-Cardiologist: No primary care provider on file.   Subjective:    Events: - Admitted w/ subxiphoid abscess and cellulitis + bleeding from wound site. Admit Hgb 7.4. COVID +.   Speed increased back to 5700.  - S/p multiple transfusions. Has received total of 3 units PRBCs and 5 units FFP.  - Blood cultures 1/2 positive for MSSA.  On cefazolin and rifabutin added .   - Went to OR 6/1 for I&D of chest wound + repair of VAD outflow graft. Required fem-fem cardiopulmonary bypass. Wound vac placed. Back to OR 6/4 for debridement and wound vac change.  - Back to OR 6/10 for rectus muscle flap over wound and wound VAC.   Feeling better today. Wants to walk.   LVAD INTERROGATION:  HeartMate III LVAD:   Flow 4.1 liters/min, speed 5700, power 4  PI 5.1   Objective:    Vital Signs:   Temp:  [98.1 F (36.7 C)-98.8 F (37.1 C)] 98.3 F (36.8 C) (06/15 0742) Pulse Rate:  [70-88] 70 (06/15 0742) Resp:  [15-24] 18 (06/15 0742) BP: (86-118)/(63-99) 118/99 (06/15 0742) SpO2:  [98 %-99 %] 98 % (06/15 0742) Weight:  [89.4 kg] 89.4 kg (06/15 0500) Last BM Date: 05/01/20 Mean arterial Pressure 80s  Intake/Output:   Intake/Output Summary (Last 24 hours) at 05/02/2020 0823 Last data filed at 05/02/2020 0800 Gross per 24 hour  Intake 990.59 ml  Output 1612.5 ml  Net -621.91 ml     Physical Exam   CVP 8  Physical Exam: GENERAL: No acute distress. Sitting in the chair.  HEENT: normal  NECK: Supple, JVP 6-7  .  2+ bilaterally, no bruits.  No lymphadenopathy or thyromegaly appreciated.   CARDIAC:  Mechanical heart sounds with LVAD hum present. VAC dressing intact  LUNGS:  Clear to auscultation bilaterally.  ABDOMEN:  Soft, round, nontender, positive bowel sounds x4.     LVAD exit site:  Dressing dry and intact.  No erythema or drainage.  Stabilization device present and  accurately applied.  Driveline dressing is being changed daily per sterile technique. EXTREMITIES:  Warm and dry, no cyanosis, clubbing, rash or edema . RUE PICC NEUROLOGIC:  Alert and oriented x 3.    No aphasia.  No dysarthria.  Affect pleasant.      Telemetry  NSR 80s personally reviewed.    Labs   Basic Metabolic Panel: Recent Labs  Lab 04/28/20 0400 04/28/20 0400 04/29/20 0500 04/29/20 0500 04/30/20 0455 04/30/20 0547 05/01/20 0500 05/02/20 0419  NA 137  --  140  --  141  --  142 143  K 3.9  --  3.7  --  3.3*  --  3.9 3.6  CL 106  --  107  --  110  --  111 112*  CO2 25  --  25  --  24  --  24 23  GLUCOSE 127*  --  126*  --  110*  --  107* 108*  BUN 8  --  6*  --  5*  --  5* 7*  CREATININE 0.82  --  0.78  --  0.71  --  0.74 0.69  CALCIUM 9.1   < > 8.9   < > 8.7*  --  8.7* 8.7*  MG  --   --   --   --   --  1.6*  --  1.8   < > = values in this interval not displayed.    Liver Function Tests: Recent Labs  Lab 04/26/20 0647 04/29/20 0500  AST 15 11*  ALT 10 7  ALKPHOS 76 61  BILITOT 0.6 0.7  PROT 6.0* 5.7*  ALBUMIN 2.9* 2.8*   No results for input(s): LIPASE, AMYLASE in the last 168 hours. No results for input(s): AMMONIA in the last 168 hours.  CBC: Recent Labs  Lab 04/28/20 1844 04/29/20 0500 04/30/20 0455 05/01/20 0500 05/02/20 0419  WBC 13.7* 12.4* 9.2 8.9 9.4  HGB 7.6* 8.4* 8.0* 7.9* 7.8*  HCT 24.1* 26.5* 25.4* 25.1* 24.9*  MCV 93.1 92.0 92.4 93.7 92.9  PLT 245 237 242 295 310    INR: Recent Labs  Lab 04/28/20 0400 04/29/20 0500 04/30/20 0455 05/01/20 0500 05/02/20 0419  INR 1.2 1.2 1.2 1.2 1.1    Other results:  EKG:    Imaging   No results found.   Medications:     Scheduled Medications: . sodium chloride   Intravenous Once  . acetaminophen  1,000 mg Oral Q6H   Or  . acetaminophen (TYLENOL) oral liquid 160 mg/5 mL  1,000 mg Oral Q6H  . aspirin  81 mg Oral Daily  . ferrous fumarate-b12-vitamic C-folic acid  1 capsule  Oral BID PC  . gabapentin  600 mg Oral QID  . hydrALAZINE  25 mg Oral Q8H  . insulin aspart  0-24 Units Subcutaneous TID AC & HS  . mupirocin cream   Topical BID  . pantoprazole  40 mg Oral Daily  . rifabutin  300 mg Oral Daily  . sodium chloride flush  10-40 mL Intracatheter Q12H  . spironolactone  12.5 mg Oral Daily  . thiamine  100 mg Oral Daily  . varenicline  1 mg Oral Daily  . vitamin B-12  1,000 mcg Oral Daily  . Warfarin - Pharmacist Dosing Inpatient   Does not apply q1600  . zinc sulfate  220 mg Oral BID    Infusions: . sodium chloride    . sodium chloride    . sodium chloride 500 mL (04/28/20 0827)  .  ceFAZolin (ANCEF) IV 2 g (05/02/20 1884)  . heparin 700 Units/hr (05/02/20 0400)    PRN Medications: Place/Maintain arterial line **AND** sodium chloride, sodium chloride, sodium chloride, acetaminophen, bisacodyl, fluticasone, hydrALAZINE, Ipratropium-Albuterol, loperamide, ondansetron (ZOFRAN) IV, oxyCODONE, oxyCODONE, sodium chloride flush, sodium chloride flush, traMADol, zolpidem   Assessment/Plan:    1. 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 He previously required debridement of lower sternalwound with woundVAC therapy and IV antibiotics.This was completed with 3 surgical procedures including debridement and ACell application in March 2021.  More recently, the lower sternal wound reopened with drainage again culturing MSSA and he had been on cephalexin + doxycycline.  The day of admission, he developed persistent bleeding from the wound site and came to ER, bleeding now stopped with suture.  Bleeding again 5/30, now stopped again. Got an additional 1 units PRBCs on 5/31 and again 6/1. Overall he has received 3 units PRBCs + 5 units FFP.  ID consulted and switched antibiotics to cefazolin.  1/2 blood cultures + for MSSA. CT chest/abd/pelvis--> Stable small amount of soft tissue tracking along the proximal  drive line in the subcutaneous ventral left abdominal wall.  S/p I&D + wound vac placement on 6/1.   Back to OR for debridement and wound vac change on 6/4. -  Back to OR for rectus muscle flap to cover sternal wound + wound vac on 6/10.  - Wound vac site looks good. No drainage.  - Continue cefazolin IV + rifabutin. ID looking into cost of rifabutin.  - Continue heparin gtt  and warfarin. INR 1.1  -Discussed dosing with PharmD personally. - Continue incentive spirometer.  2. COVID-19 infection: Asymptomatic. No cough/dyspnea.  Repeat COVID-19 test negative 6/5. - He is off isolation.    3. 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 LVAD parameters stable. MAPs low initially, started on phenylephrine (may have been at least partly vagal as very diaphoretic with bleeding).  Speed back up to 5700 rpm. MAPs recently low after restarting home meds  - Volume status stable. CVP 7-8   - Maps in the 100s.  -Continue 12.5 mg spiro 25 daily.  - Add entresto 24-26 mg twice a day.  -Continue hydralazine 25 tid. Can use prn IV hydral for HTN - VAD interrogated personally. Parameters stable. 4. Smoking:He has cut back a lot, still taking Chantix. Encouraged to quit. 5. H/o RLE DVT: On warfarin for LVAD but held for wound exploration 6. OSA: Continue CPAP.  7. Hyperlipidemia: Atorvastatin.  8. Type II diabetes:SSI 9. Anemia, Acute blood loss: -  Hgb down, 10.4>>9.2>>8.4 > 7.1 > 8.4 -> 8.0->7.9 ->7.8  - Transfuse if Hgb< 7.5  - Expected surgical blood loss. Monitor closely 10. Post-op ileus. - improving - advancing diet. Wean PCA 11. Hypokalemia/hypomag - Mag 1.8. Give 2 grams mag.     I reviewed the LVAD parameters from today, and compared the results to the patient's prior recorded data.  No programming changes were made.  The LVAD is functioning within specified parameters.  The patient performs LVAD self-test daily.  LVAD interrogation was negative for  any significant power changes, alarms or PI events/speed drops.  LVAD equipment check completed and is in good working order.  Back-up equipment present.   LVAD education done on emergency procedures and precautions and reviewed exit site care.  Length of Stay: 16  Amy Clegg NP-C  05/02/2020 8:23 AM  VAD Team --- VAD ISSUES ONLY--- Pager [phone] (7am - 7am)  Advanced Heart Failure Team  Pager [phone] (M-F; 7a - 4p)  Please contact [facility] for night-coverage after hours (4p -7a ) and weekends on [email or web address]  Patient seen and examined with the above-signed Advanced Practice Provider and/or Housestaff. I personally reviewed laboratory data, imaging studies and relevant notes. I independently examined the patient and formulated the important aspects of the plan. I have edited the note to reflect any of my changes or salient points. I have personally discussed the plan with the patient and/or family.  H is feeling better today. More energy. Appetite improved. Ab less bloated. Bowels working. Remains on abx. Afebrile. INR 1.1. On heparin without bleeding  General:  NAD.  HEENT: normal  Neck: supple. JVP not elevated.  Carotids 2+ bilat; no bruits. No lymphadenopathy or thryomegaly appreciated. Cor: LVAD hum.  Lungs: Clear. Abdomen: obese soft, nontender, + distended wound vac and dressings ok  No hepatosplenomegaly. No bruits or masses. Good bowel sounds. Driveline site clean. Anchor in place.  Extremities: no cyanosis, clubbing, rash. Warm no edema  Neuro: alert & oriented x 3. No focal deficits. Moves all 4 without problem   Continues to improve. Advancing diet. Now off PCA. Agree with restarting Entresto for elevated MAPs. Continue heparin. Warfarin dosing d/w PharmD. VAD interrogated personally. Parameters stable.  Continue to ambulate.   Glori Bickers, MD  5:40 PM

## 2020-05-02 NOTE — Progress Notes (Signed)
ANTICOAGULATION CONSULT NOTE   Pharmacy Consult for heparin - per TCTS dosing, warfarin Indication: LVAD  Allergies  Allergen Reactions  . Chlorhexidine Rash  . Other Rash    Prep pads    Patient Measurements: Height: 5\' 11"  (180.3 cm) Weight: 89.4 kg (197 lb 1.5 oz) IBW/kg (Calculated) : 75.3 Heparin Dosing Weight: ~ 90 kg  Vital Signs: Temp: 98.6 F (37 C) (06/15 1141) Temp Source: Oral (06/15 1141) BP: 104/80 (06/15 1141) Pulse Rate: 70 (06/15 1141)  Labs: Recent Labs    04/30/20 0455 04/30/20 0455 05/01/20 0500 05/02/20 0419 05/02/20 0420  HGB 8.0*   < > 7.9* 7.8*  --   HCT 25.4*  --  25.1* 24.9*  --   PLT 242  --  295 310  --   LABPROT 14.6  --  14.7 14.1  --   INR 1.2  --  1.2 1.1  --   HEPARINUNFRC <0.10*  --  <0.10*  --  <0.10*  CREATININE 0.71  --  0.74 0.69  --    < > = values in this interval not displayed.    Estimated Creatinine Clearance: 103.3 mL/min (by C-G formula based on SCr of 0.69 mg/dL).   Medical History: Past Medical History:  Diagnosis Date  . 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 Cardiomyopathy, unspecified (HCC)   . CHF (congestive heart failure) (HCC)   . Chronic back pain   . Diabetes mellitus without complication (HCC)   . Enlarged heart   . Gastric ulcer   . Gastroenteritis   . H/O degenerative disc disease   . Hypertension   . LVAD (left ventricular assist device) present (HCC)     Medications:   Infusions:  . sodium chloride    . sodium chloride    . sodium chloride 500 mL (04/28/20 0827)  .  ceFAZolin (ANCEF) IV 2 g (05/02/20 05/04/20)  . heparin 800 Units/hr (05/02/20 05/04/20)    Assessment: 59 yo male with LVAD - HM3 + ASA 81mg  daily, MSSA bacteremia and  driveline infection.  Chronic Coumadin has been on hold for procedures - flap 6/10.    PTA dose of warfarin: 7.5 mg MWF, 5 mg all other days   Underwent sternal wound debridement with flap and wound VAC application on 6/10. Warfarin restarted on 6/11 per MD.  Plan to increase heparin infusion to 800 units/hr without any titration per MD. Heparin level is undetectable as expected. INR today is 1.1 - pharmacy to manage warfarin dosing. Hgb 7.8, plt 310. No s/sx of bleeding.   Goal of Therapy:  INR: 2-2.5  Monitor platelets by anticoagulation protocol: Yes   Plan:  Increase heparin infusion to 800 units/hr per MD Warfarin 5 tonight  Will get AM heparin labs (no titration)  Monitor daily INR  8/10, PharmD, BCCCP Clinical Pharmacist  Phone: [phone] 05/02/2020 1:31 PM  Please check AMION for all [facility] Pharmacy phone numbers After 10:00 PM, call Main Pharmacy [phone]

## 2020-05-02 NOTE — Progress Notes (Signed)
Pharmacy Antibiotic Note  Theodore Welch is a 59 y.o. male admitted on 04/16/2020 LVAD sternal wound infection. Pt has hx of recurrent MSSA infection and is now s/p 8 weeks cefazolin followed by cephalexin.  S/p exploration in OR 6/1 with wound vac change in OR 6/4. Sternal wound debridement and muscle flap done on 6/10.  Afebrile, WBC 9.4, Scr 0.69.    5/30 Bcx 1/2 MSSA  6/1 sternum: MSSA 6/3 Bcx ngF 6/4 wound: ngF  Plan: Continue cefazolin 2g IV q8h Continue rifabutin 300 mg qd Plan for listed antibiotics until July 7th, 2021 After would start cephalexin suppressive therapy - ambulatory ID appointment scheduled prior to stop date on IV antibiotic orders  Height: 5\' 11"  (180.3 cm) Weight: 89.4 kg (197 lb 1.5 oz) IBW/kg (Calculated) : 75.3  Temp (24hrs), Avg:98.5 F (36.9 C), Min:98.2 F (36.8 C), Max:98.8 F (37.1 C)  Recent Labs  Lab 04/28/20 0400 04/28/20 0400 04/28/20 1844 04/29/20 0500 04/30/20 0455 05/01/20 0500 05/02/20 0419  WBC 13.3*   < > 13.7* 12.4* 9.2 8.9 9.4  CREATININE 0.82  --   --  0.78 0.71 0.74 0.69   < > = values in this interval not displayed.    Estimated Creatinine Clearance: 103.3 mL/min (by C-G formula based on SCr of 0.69 mg/dL).    Allergies  Allergen Reactions  . Chlorhexidine Rash  . Other Rash    Prep pads    Thank you for allowing pharmacy to be a part of this patient's care.  05/04/20, PharmD, BCCCP Clinical Pharmacist  Phone: [phone] 05/02/2020 1:30 PM  Please check AMION for all [facility] Pharmacy phone numbers After 10:00 PM, call Main Pharmacy [phone]

## 2020-05-02 NOTE — Progress Notes (Signed)
Patient's PICC line dressing was changed this AM using sterile technique. New stat lock was applied and alcohol was used as cleanser to skin. Patient has an allergy to chlorhexidine so a Biopatch disc was not placed on patients skin under the outer dressing. Patient tolerated the procedure well and stated the dressing feels much better. Will continue to monitor patient closely.

## 2020-05-02 NOTE — Progress Notes (Signed)
5 Days Post-Op Procedure(s) (LRB): STERNAL WOUND DEBRIDEMENT (N/A) MUSCLE FLAP TO STERNUM (N/A) APPLICATION OF A-CELL OF CHEST (N/A) APPLICATION OF PREVENA INCISIONAL WOUND VAC (N/A) Subjective: Patient continues to do well with minimal pain, minimal output from wound drains Patient on heparin bridge while Coumadin loading in progress as directed by pharmD Praveena sponge over lower sternal wound compressed with minimal drainage Objective: Vital signs in last 24 hours: Temp:  [98.1 F (36.7 C)-98.8 F (37.1 C)] 98.3 F (36.8 C) (06/15 0742) Pulse Rate:  [70-88] 70 (06/15 0742) Cardiac Rhythm: Normal sinus rhythm (06/15 0742) Resp:  [15-24] 18 (06/15 0742) BP: (86-118)/(63-99) 118/99 (06/15 0742) SpO2:  [98 %-99 %] 98 % (06/15 0742) Weight:  [89.4 kg] 89.4 kg (06/15 0500)  Hemodynamic parameters for last 24 hours: CVP:  [5 mmHg-8 mmHg] 7 mmHg  Intake/Output from previous day: 06/14 0701 - 06/15 0700 In: 1162.6 [P.O.:720; I.V.:142.6; IV Piggyback:300] Out: 1612.5 [Urine:1600; Drains:12.5] Intake/Output this shift: Total I/O In: 228 [I.V.:28; IV Piggyback:200] Out: 12.5 [Drains:12.5]  Exam Alert and oriented Lungs clear Normal VAD hum Peripheral edema improved  Lab Results: Recent Labs    05/01/20 0500 05/02/20 0419  WBC 8.9 9.4  HGB 7.9* 7.8*  HCT 25.1* 24.9*  PLT 295 310   BMET:  Recent Labs    05/01/20 0500 05/02/20 0419  NA 142 143  K 3.9 3.6  CL 111 112*  CO2 24 23  GLUCOSE 107* 108*  BUN 5* 7*  CREATININE 0.74 0.69  CALCIUM 8.7* 8.7*    PT/INR:  Recent Labs    05/02/20 0419  LABPROT 14.1  INR 1.1   ABG    Component Value Date/Time   PHART 7.478 (H) 04/26/2020 0523   HCO3 26.5 04/26/2020 0523   TCO2 27 04/19/2020 0832   ACIDBASEDEF 1.0 04/18/2020 1513   O2SAT 81.2 04/26/2020 0523   CBG (last 3)  Recent Labs    05/01/20 1603 05/01/20 2116 05/02/20 0836  GLUCAP 81 136* 228*    Assessment/Plan: S/P Procedure(s) (LRB): STERNAL  WOUND DEBRIDEMENT (N/A) MUSCLE FLAP TO STERNUM (N/A) APPLICATION OF A-CELL OF CHEST (N/A) APPLICATION OF PREVENA INCISIONAL WOUND VAC (N/A) Mobilize Continue IV Ancef and would recommend home IV Ancef through PICC line for 2 weeks postop   LOS: 16 days    Theodore Welch 05/02/2020

## 2020-05-03 LAB — BASIC METABOLIC PANEL
Anion gap: 7 (ref 5–15)
BUN: 7 mg/dL — ABNORMAL LOW (ref 8–23)
CO2: 23 mmol/L (ref 22–32)
Calcium: 8.8 mg/dL — ABNORMAL LOW (ref 8.9–10.3)
Chloride: 111 mmol/L (ref 98–111)
Creatinine, Ser: 0.67 mg/dL (ref 0.61–1.24)
GFR calc Af Amer: >60 mL/min (ref >60)
GFR calc non Af Amer: >60 mL/min (ref >60)
Glucose, Bld: 111 mg/dL — ABNORMAL HIGH (ref 70–99)
Potassium: 3.7 mmol/L (ref 3.5–5.1)
Sodium: 141 mmol/L (ref 135–145)

## 2020-05-03 LAB — CBC
HCT: 25.9 % — ABNORMAL LOW (ref 39.0–52.0)
Hemoglobin: 8.1 g/dL — ABNORMAL LOW (ref 13.0–17.0)
MCH: 29 pg (ref 26.0–34.0)
MCHC: 31.3 g/dL (ref 30.0–36.0)
MCV: 92.8 fL (ref 80.0–100.0)
Platelets: 314 10*3/uL (ref 150–400)
RBC: 2.79 MIL/uL — ABNORMAL LOW (ref 4.22–5.81)
RDW: 13.2 % (ref 11.5–15.5)
WBC: 8.1 10*3/uL (ref 4.0–10.5)
nRBC: 0 % (ref 0.0–0.2)

## 2020-05-03 LAB — GLUCOSE, CAPILLARY
Glucose-Capillary: 117 mg/dL — ABNORMAL HIGH (ref 70–99)
Glucose-Capillary: 74 mg/dL (ref 70–99)
Glucose-Capillary: 99 mg/dL (ref 70–99)
Glucose-Capillary: 99 mg/dL (ref 70–99)

## 2020-05-03 LAB — HEPARIN LEVEL (UNFRACTIONATED): Heparin Unfractionated: <0.1 IU/mL — ABNORMAL LOW (ref 0.30–0.70)

## 2020-05-03 LAB — LACTATE DEHYDROGENASE: LDH: 150 U/L (ref 98–192)

## 2020-05-03 LAB — PROTIME-INR
INR: 1.3 — ABNORMAL HIGH (ref 0.8–1.2)
Prothrombin Time: 16 seconds — ABNORMAL HIGH (ref 11.4–15.2)

## 2020-05-03 MED ORDER — MUPIROCIN 2 % EX OINT
TOPICAL_OINTMENT | CUTANEOUS | Status: AC
  Filled 2020-05-03: qty 22

## 2020-05-03 MED ORDER — WARFARIN SODIUM 7.5 MG PO TABS
7.5000 mg | ORAL_TABLET | Freq: Once | ORAL | Status: AC
  Administered 2020-05-03: 7.5 mg via ORAL
  Filled 2020-05-03: qty 1

## 2020-05-03 NOTE — Progress Notes (Signed)
Drive line site care: Existing VAD dressing removed and site care performed using sterile technique. Drive line exit site covered with Mepitel dressing (covering Acell powder). Skin around Mepitel dressing cleaned with betadine swabs x 2, DL rinsed with sterile saline, alowed to dry and covered with sterile gel, saline soaked 2 x 2's, then covered with dry sterile gauze dressing. Exit site with mepitel dressing intact,  the velour is exposed under this dressing @ 2 cm. Moderate amount serous drainage on gauze dressing, no redness, tenderness, foul odor or rash noted. Drive line anchor re-applied.      [name] at bedside; reviewed dressing change with wife. This is what Dr. Maren Beach will want her to do daily at home after patient discharge. She has done this in the past and verbalizes understanding of same. We will make sure she has supplies at time of discharge.   Pt saw Plastics today and says he will have f/u with them next week. He would like his VAD visit scheduled on same day.   Hessie Diener RN, VAD Coordinator [phone]

## 2020-05-03 NOTE — Progress Notes (Signed)
6 Days Post-Op  Subjective: No changes from yesterday PM, doing well. Fiance/caregiver at bedside.  Dr. Ulice Bold saw patient with me today.  Objective: Vital signs in last 24 hours: Temp:  [98.1 F (36.7 C)-99.5 F (37.5 C)] 98.1 F (36.7 C) (06/16 0710) Pulse Rate:  [70-90] 88 (06/16 0710) Resp:  [15-20] 15 (06/16 0710) BP: (92-119)/(78-92) 117/89 (06/16 0710) SpO2:  [96 %-99 %] 97 % (06/16 0710) Weight:  [89.4 kg-92.6 kg] 89.4 kg (06/16 0636) Last BM Date: 05/01/20  Intake/Output from previous day: 06/15 0701 - 06/16 0700 In: 1319.7 [P.O.:240; I.V.:579.7; IV Piggyback:500] Out: 812.5 [Urine:775; Drains:37.5] Intake/Output this shift: No intake/output data recorded.  General appearance: alert, cooperative and no distress Chest wall: Sternal incision healing well, no wounds noted. No drainage noted. No fluid collections or fluid wave noted. No erythema. Chest was JP drain with minimal serosanguinous output.  Incision/wound: Right lower abdomen JP drain in place - serosanguinous drainage in bulb. Abdominal incision healing well with a small area of exposed granulation tissue. No periwound erythema, drainage or foul odors noted.    Lab Results:  CBC Latest Ref Rng & Units 05/03/2020 05/02/2020 05/01/2020  WBC 4.0 - 10.5 K/uL 8.1 9.4 8.9  Hemoglobin 13.0 - 17.0 g/dL 8.1(L) 7.8(L) 7.9(L)  Hematocrit 39 - 52 % 25.9(L) 24.9(L) 25.1(L)  Platelets 150 - 400 K/uL 314 310 295    BMET Recent Labs    05/02/20 0419 05/03/20 0415  NA 143 141  K 3.6 3.7  CL 112* 111  CO2 23 23  GLUCOSE 108* 111*  BUN 7* 7*  CREATININE 0.69 0.67  CALCIUM 8.7* 8.8*   PT/INR Recent Labs    05/02/20 0419 05/03/20 0415  LABPROT 14.1 16.0*  INR 1.1 1.3*   ABG No results for input(s): PHART, HCO3 in the last 72 hours.  Invalid input(s): PCO2, PO2  Studies/Results: No results found.  Anti-infectives: Anti-infectives (From admission, onward)   Start     Dose/Rate Route Frequency Ordered  Stop   04/27/20 0911  vancomycin (VANCOCIN) 1,000 mg in sodium chloride 0.9 % 1,000 mL irrigation  Status:  Discontinued          As needed 04/27/20 0911 04/27/20 1230   04/27/20 0815  ceFAZolin (ANCEF) IVPB 2g/100 mL premix  Status:  Discontinued        2 g 200 mL/hr over 30 Minutes Intravenous On call to O.R. 04/27/20 0809 04/27/20 0812   04/27/20 0800  ceFAZolin (ANCEF) IVPB 2g/100 mL premix        2 g 200 mL/hr over 30 Minutes Intravenous To ShortStay Surgical 04/26/20 0827 04/27/20 0630   04/25/20 1530  rifabutin (MYCOBUTIN) capsule 300 mg     Discontinue     300 mg Oral Daily 04/25/20 1529     04/21/20 1506  vancomycin (VANCOCIN) 1,000 mg in sodium chloride 0.9 % 1,000 mL irrigation  Status:  Discontinued          As needed 04/21/20 1506 04/21/20 1649   04/21/20 1300  ceFAZolin (ANCEF) IVPB 2g/100 mL premix        2 g 200 mL/hr over 30 Minutes Intravenous To ShortStay Surgical 04/20/20 0834 04/21/20 1557   04/18/20 1400  vancomycin (VANCOREADY) IVPB 1500 mg/300 mL  Status:  Discontinued        1,500 mg 150 mL/hr over 120 Minutes Intravenous To Surgery 04/18/20 1348 04/18/20 1639   04/18/20 1127  vancomycin (VANCOCIN) 1,000 mg in sodium chloride 0.9 % 1,000 mL irrigation  Status:  Discontinued          As needed 04/18/20 1128 04/18/20 1631   04/16/20 1700  vancomycin (VANCOREADY) IVPB 1250 mg/250 mL  Status:  Discontinued        1,250 mg 166.7 mL/hr over 90 Minutes Intravenous Every 12 hours 04/16/20 0746 04/16/20 1241   04/16/20 1700  ceFAZolin (ANCEF) IVPB 2g/100 mL premix     Discontinue     2 g 200 mL/hr over 30 Minutes Intravenous Every 8 hours 04/16/20 1247     04/16/20 0600  piperacillin-tazobactam (ZOSYN) IVPB 3.375 g  Status:  Discontinued        3.375 g 12.5 mL/hr over 240 Minutes Intravenous Every 6 hours 04/16/20 0516 04/16/20 0535   04/16/20 0600  piperacillin-tazobactam (ZOSYN) IVPB 3.375 g  Status:  Discontinued        3.375 g 12.5 mL/hr over 240 Minutes  Intravenous Every 8 hours 04/16/20 0536 04/16/20 1241   04/16/20 0530  vancomycin (VANCOCIN) IVPB 1000 mg/200 mL premix  Status:  Discontinued        1,000 mg 200 mL/hr over 60 Minutes Intravenous Every 12 hours 04/16/20 0516 04/16/20 0535   04/16/20 0430  vancomycin (VANCOREADY) IVPB 1750 mg/350 mL        1,750 mg 175 mL/hr over 120 Minutes Intravenous  Once 04/16/20 0429 04/16/20 0742      Assessment/Plan: s/p Procedure(s): STERNAL WOUND DEBRIDEMENT MUSCLE FLAP TO STERNUM APPLICATION OF A-CELL OF CHEST APPLICATION OF PREVENA INCISIONAL WOUND VAC  Chest wall JP drain and prevena vac removed during today's visit, prevena vac replaced, leave in place for additional 1 week of vac therapy.  Plan to remove right lower abdomen JP drain in ~ 1 week pending output.  Changed aquacell Ag dressing today, leave in place for 1 week.     LOS: 17 days    Charlies Constable, PA-C 05/03/2020

## 2020-05-03 NOTE — Progress Notes (Signed)
6 Days Post-Op Procedure(s) (LRB): STERNAL WOUND DEBRIDEMENT (N/A) MUSCLE FLAP TO STERNUM (N/A) APPLICATION OF A-CELL OF CHEST (N/A) APPLICATION OF PREVENA INCISIONAL WOUND VAC (N/A) Subjective: Postop coumadin reloading ongoing with heparin bridge- now at 1000 u/hr One JP drain out today Hb stable about 8.0 g Objective: Vital signs in last 24 hours: Temp:  [98.1 F (36.7 C)-99.5 F (37.5 C)] 98.1 F (36.7 C) (06/16 0710) Pulse Rate:  [70-90] 88 (06/16 0710) Cardiac Rhythm: Normal sinus rhythm (06/16 0730) Resp:  [15-20] 15 (06/16 0710) BP: (92-119)/(78-92) 117/89 (06/16 0710) SpO2:  [96 %-99 %] 97 % (06/16 0710) Weight:  [89.4 kg-92.6 kg] 89.4 kg (06/16 0636)  Hemodynamic parameters for last 24 hours: CVP:  [3 mmHg-7 mmHg] 6 mmHg  Intake/Output from previous day: 06/15 0701 - 06/16 0700 In: 1319.7 [P.O.:240; I.V.:579.7; IV Piggyback:500] Out: 812.5 [Urine:775; Drains:37.5] Intake/Output this shift: No intake/output data recorded.       Exam    General- alert and comfortable    Neck- no JVD, no cervical adenopathy palpable, no carotid bruit   Lungs- clear without rales, wheezes   Cor- regular rate and rhythm, normal VAD hum , gallop   Abdomen- soft, non-tender   Extremities - warm, non-tender, minimal edema   Neuro- oriented, appropriate, no focal weakness   Lab Results: Recent Labs    05/02/20 0419 05/03/20 0415  WBC 9.4 8.1  HGB 7.8* 8.1*  HCT 24.9* 25.9*  PLT 310 314   BMET:  Recent Labs    05/02/20 0419 05/03/20 0415  NA 143 141  K 3.6 3.7  CL 112* 111  CO2 23 23  GLUCOSE 108* 111*  BUN 7* 7*  CREATININE 0.69 0.67  CALCIUM 8.7* 8.8*    PT/INR:  Recent Labs    05/03/20 0415  LABPROT 16.0*  INR 1.3*   ABG    Component Value Date/Time   PHART 7.478 (H) 04/26/2020 0523   HCO3 26.5 04/26/2020 0523   TCO2 27 04/19/2020 0832   ACIDBASEDEF 1.0 04/18/2020 1513   O2SAT 81.2 04/26/2020 0523   CBG (last 3)  Recent Labs    05/02/20 1615  05/02/20 2118 05/03/20 0610  GLUCAP 103* 81 117*    Assessment/Plan: S/P Procedure(s) (LRB): STERNAL WOUND DEBRIDEMENT (N/A) MUSCLE FLAP TO STERNUM (N/A) APPLICATION OF A-CELL OF CHEST (N/A) APPLICATION OF PREVENA INCISIONAL WOUND VAC (N/A) Doing well - home when INR approaches 2.0 2 wks of IV Ancef at home thru PICC Daily dressing change of power cord exit site - cont with  mepitel, lube and wet to dry Keep main incision clean with betadine and cover with light dry dressing   LOS: 17 days    Theodore Welch 05/03/2020

## 2020-05-03 NOTE — Progress Notes (Signed)
PHARMACY CONSULT NOTE FOR:  OUTPATIENT  PARENTERAL ANTIBIOTIC THERAPY (OPAT)  Indication: MSSA bacteremia/LVAD infection Regimen: Cefazolin 2 gm IV q 8 hours + Rifabutin 300 mg daily End date: 05/24/20  IV antibiotic discharge orders are pended. To discharging provider:  please sign these orders via discharge navigator,  Select New Orders & click on the button choice - Manage This Unsigned Work.     Thank you for allowing pharmacy to be a part of this patient's care.  Sharin Mons, PharmD, BCPS, BCIDP Infectious Diseases Clinical Pharmacist Phone: [phone] 05/03/2020, 12:03 PM

## 2020-05-03 NOTE — Progress Notes (Addendum)
Patient ID: Theodore Welch, male   DOB: [birth year], 59 y.o.   MRN: [id number]   Advanced Heart Failure VAD Team Note  PCP-Cardiologist:  Dr. [name]  Subjective:    Events: - Admitted w/ subxiphoid abscess and cellulitis + bleeding from wound site. Admit Hgb 7.4. COVID +.   Speed increased back to 5700.  - S/p multiple transfusions. Has received total of 3 units PRBCs and 5 units FFP.  - Blood cultures 1/2 positive for MSSA.  On cefazolin and rifabutin added .   - Went to OR 6/1 for I&D of chest wound + repair of VAD outflow graft. Required fem-fem cardiopulmonary bypass. Wound vac placed. Back to OR 6/4 for debridement and wound vac change.  - Back to OR 6/10 for rectus muscle flap over wound and wound VAC.   INR remains subtherapeutic at 1.3. Continues w/ heparin bridge. Hgb stable 8.1. MAPs upper 90s.   Feels well. Sitting up in bed. No complaints.     LVAD INTERROGATION:  HeartMate III LVAD:   Flow 4.3 liters/min, speed 5700, power 4.3 PI 4.3  Objective:    Vital Signs:   Temp:  [98.1 F (36.7 C)-99.5 F (37.5 C)] 98.1 F (36.7 C) (06/16 0710) Pulse Rate:  [70-90] 88 (06/16 0710) Resp:  [15-20] 15 (06/16 0710) BP: (92-119)/(78-92) 117/89 (06/16 0710) SpO2:  [96 %-99 %] 97 % (06/16 0710) Weight:  [89.4 kg-92.6 kg] 89.4 kg (06/16 0636) Last BM Date: 05/03/20 Mean arterial Pressure 80s  Intake/Output:   Intake/Output Summary (Last 24 hours) at 05/03/2020 1041 Last data filed at 05/03/2020 0600 Gross per 24 hour  Intake 1091.65 ml  Output 800 ml  Net 291.65 ml     Physical Exam   Physical Exam: General:  NAD.  HEENT: normal  Neck: supple. JVP not elevated.  Carotids 2+ bilat; no bruits. No lymphadenopathy or thryomegaly appreciated. Cor: LVAD hum.  Lungs: Clear. Abdomen:soft, nontender, mildly distended. Vac and dressing ok No hepatosplenomegaly. No bruits or masses. Good bowel sounds. Driveline site clean. Anchor in place.  Extremities: no cyanosis, clubbing,  rash. Warm no edema  Neuro: alert & oriented x 3. No focal deficits. Moves all 4 without problem   Telemetry  NSR  70s Personally reviewed  Labs   Basic Metabolic Panel: Recent Labs  Lab 04/29/20 0500 04/29/20 0500 04/30/20 0455 04/30/20 0455 04/30/20 0547 05/01/20 0500 05/02/20 0419 05/03/20 0415  NA 140  --  141  --   --  142 143 141  K 3.7  --  3.3*  --   --  3.9 3.6 3.7  CL 107  --  110  --   --  111 112* 111  CO2 25  --  24  --   --  24 23 23   GLUCOSE 126*  --  110*  --   --  107* 108* 111*  BUN 6*  --  5*  --   --  5* 7* 7*  CREATININE 0.78  --  0.71  --   --  0.74 0.69 0.67  CALCIUM 8.9   < > 8.7*   < >  --  8.7* 8.7* 8.8*  MG  --   --   --   --  1.6*  --  1.8  --    < > = values in this interval not displayed.    Liver Function Tests: Recent Labs  Lab 04/29/20 0500  AST 11*  ALT 7  ALKPHOS 61  BILITOT 0.7  PROT 5.7*  ALBUMIN 2.8*   No results for input(s): LIPASE, AMYLASE in the last 168 hours. No results for input(s): AMMONIA in the last 168 hours.  CBC: Recent Labs  Lab 04/29/20 0500 04/30/20 0455 05/01/20 0500 05/02/20 0419 05/03/20 0415  WBC 12.4* 9.2 8.9 9.4 8.1  HGB 8.4* 8.0* 7.9* 7.8* 8.1*  HCT 26.5* 25.4* 25.1* 24.9* 25.9*  MCV 92.0 92.4 93.7 92.9 92.8  PLT 237 242 295 310 314    INR: Recent Labs  Lab 04/29/20 0500 04/30/20 0455 05/01/20 0500 05/02/20 0419 05/03/20 0415  INR 1.2 1.2 1.2 1.1 1.3*    Other results:  EKG:    Imaging   No results found.   Medications:     Scheduled Medications: . sodium chloride   Intravenous Once  . aspirin  81 mg Oral Daily  . ferrous fumarate-b12-vitamic C-folic acid  1 capsule Oral BID PC  . gabapentin  600 mg Oral QID  . hydrALAZINE  25 mg Oral Q8H  . insulin aspart  0-24 Units Subcutaneous TID AC & HS  . mupirocin cream   Topical BID  . pantoprazole  40 mg Oral Daily  . rifabutin  300 mg Oral Daily  . sacubitril-valsartan  1 tablet Oral BID  . sodium chloride flush  10-40  mL Intracatheter Q12H  . spironolactone  12.5 mg Oral Daily  . thiamine  100 mg Oral Daily  . varenicline  1 mg Oral Daily  . vitamin B-12  1,000 mcg Oral Daily  . Warfarin - Pharmacist Dosing Inpatient   Does not apply q1600  . zinc sulfate  220 mg Oral BID    Infusions: . sodium chloride    . sodium chloride    . sodium chloride Stopped (05/03/20 0300)  .  ceFAZolin (ANCEF) IV Stopped (05/03/20 0545)  . 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 He previously required debridement of lower sternalwound with woundVAC therapy and IV antibiotics.This was completed with 3 surgical procedures including debridement and ACell application in March 2021.  More recently, the lower sternal wound reopened with drainage again culturing MSSA and he had been on cephalexin + doxycycline.  The day of admission, he developed persistent bleeding from the wound site and came to ER, bleeding now stopped with suture.  Bleeding again 5/30, now stopped again. Got an additional 1 units PRBCs on 5/31 and again 6/1. Overall he has received 3 units PRBCs + 5 units FFP.  ID consulted and switched antibiotics to cefazolin.  1/2 blood cultures + for MSSA. CT chest/abd/pelvis--> Stable small amount of soft tissue tracking along the proximal drive line in the subcutaneous ventral left abdominal wall.  S/p I&D + wound vac placement on 6/1.   Back to OR for debridement and wound vac change on 6/4. - Back to OR for rectus muscle flap to cover sternal wound + wound vac on 6/10.  - Wound vac site looks  good.  - Continue cefazolin IV + rifabutin. ID looking into cost of rifabutin.  - Continue heparin gtt  and warfarin. INR 1.3  -Discussed dosing with PharmD personally. - Continue incentive spirometer.  2. COVID-19 infection: Asymptomatic. No cough/dyspnea.  Repeat COVID-19 test  negative 6/5. - He is off isolation.    3. 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 LVAD parameters stable. MAPs low initially, started on phenylephrine (may have been at least partly vagal as very diaphoretic with bleeding).  Speed back up to 5700 rpm. MAPs recently low after restarting home meds  - Volume status stable. CVP 6 - MAPs in the 90s - Continue 12.5 mg spiro 25 daily.  - Continue Entresto 24-26 mg twice a day. Consider titrating back to 97-103 if MAPs remain elevated.  -Continue hydralazine 25 tid. Can use prn IV hydral for HTN - VAD interrogated personally. Parameters stable. 4. Smoking:He has cut back a lot, still taking Chantix. Encouraged to quit. 5. H/o RLE DVT: on coumadin  6. OSA: Continue CPAP.  7. Hyperlipidemia: Atorvastatin.  8. Type II diabetes:SSI 9. Anemia, Acute blood loss: -  Hgb down, 10.4>>9.2>>8.4 > 7.1 > 8.4 -> 8.0->7.9 ->7.8->8.1 - Transfuse if Hgb< 7.5  - Expected surgical blood loss. Monitor closely 10. Post-op ileus. - resolved. Now off PCA 11. Hypokalemia/hypomag - K 3.7 today  - Mg pending - supp as needed     I reviewed the LVAD parameters from today, and compared the results to the patient's prior recorded data.  No programming changes were made.  The LVAD is functioning within specified parameters.  The patient performs LVAD self-test daily.  LVAD interrogation was negative for any significant power changes, alarms or PI events/speed drops.  LVAD equipment check completed and is in good working order.  Back-up equipment present.   LVAD education done on emergency procedures and precautions and reviewed exit site  care.  Length of Stay: 17  Brittainy Simmons NP-C  05/03/2020 10:41 AM  VAD Team --- VAD ISSUES ONLY--- Pager [phone] (7am - 7am)  Advanced Heart Failure Team  Pager [phone] (M-F; 7a - 4p)  Please contact [facility] for night-coverage after hours (4p -7a ) and weekends on [email or web address]   Patient seen and examined with the above-signed Advanced Practice Provider and/or Housestaff. I personally reviewed laboratory data, imaging studies and relevant notes. I independently examined the patient and formulated the important aspects of the plan. I have edited the note to reflect any of my changes or salient points. I have personally discussed the plan with the patient and/or family.  Continue to progress. Feeling better. Appetite good. Afebrile on IV abx. One JP drain out. INR 1.3. No bleeding on heparin.   General:  NAD.  HEENT: normal  Neck: supple. JVP not elevated.  Carotids 2+ bilat; no bruits. No lymphadenopathy or thryomegaly appreciated. Cor: LVAD hum.  Lungs: Clear. Abdomen: obese soft, nontender, non-distended. No hepatosplenomegaly. No bruits or masses. Good bowel sounds. Driveline site clean. Anchor in place.  Extremities: no cyanosis, clubbing, rash. Warm no edema  Neuro: alert & oriented x 3. No focal deficits. Moves all 4 without problem   Continues to improve. Continue heparin until INR > = 1.8. Continue IV abx. Appreciate ID's help for home abx. VAD interrogated personally. Parameters stable.  Arvilla Meres, MD  12:50 PM

## 2020-05-03 NOTE — Progress Notes (Signed)
Patient refusing to stand for daily weight.  He states "I don't care how you do it, or what time you do it, but I'm not getting up".  Patient followed this statement by saying "it's just too much to do".  Patient understands importance of standing for daily weight but still declines.  Staff will attempt to stand patient again later this morning.

## 2020-05-03 NOTE — Progress Notes (Signed)
ANTICOAGULATION CONSULT NOTE   Pharmacy Consult for heparin - per TCTS dosing, warfarin Indication: LVAD  Allergies  Allergen Reactions  . Chlorhexidine Rash  . Other Rash    Prep pads    Patient Measurements: Height: 5\' 11"  (180.3 cm) Weight: 89.4 kg (197 lb 1.5 oz) IBW/kg (Calculated) : 75.3 Heparin Dosing Weight: ~ 90 kg  Vital Signs: Temp: 98.1 F (36.7 C) (06/16 0710) Temp Source: Oral (06/16 0710) BP: 117/89 (06/16 0710) Pulse Rate: 88 (06/16 0710)  Labs: Recent Labs    05/01/20 0500 05/01/20 0500 05/02/20 0419 05/02/20 0420 05/03/20 0415  HGB 7.9*   < > 7.8*  --  8.1*  HCT 25.1*  --  24.9*  --  25.9*  PLT 295  --  310  --  314  LABPROT 14.7  --  14.1  --  16.0*  INR 1.2  --  1.1  --  1.3*  HEPARINUNFRC <0.10*  --   --  <0.10* <0.10*  CREATININE 0.74  --  0.69  --  0.67   < > = values in this interval not displayed.    Estimated Creatinine Clearance: 103.3 mL/min (by C-G formula based on SCr of 0.67 mg/dL).   Medical History: Past Medical History:  Diagnosis Date  . 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 Cardiomyopathy, unspecified (HCC)   . CHF (congestive heart failure) (HCC)   . Chronic back pain   . Diabetes mellitus without complication (HCC)   . Enlarged heart   . Gastric ulcer   . Gastroenteritis   . H/O degenerative disc disease   . Hypertension   . LVAD (left ventricular assist device) present (HCC)     Medications:   Infusions:  . sodium chloride    . sodium chloride    . sodium chloride Stopped (05/03/20 0300)  .  ceFAZolin (ANCEF) IV Stopped (05/03/20 0545)  . heparin 1,000 Units/hr (05/03/20 05/05/20)    Assessment: 59 yo male with LVAD - HM3 + ASA 81mg  daily, MSSA bacteremia and  driveline infection.  Chronic Coumadin has been on hold for procedures - flap 6/10.    PTA dose of warfarin: 7.5 mg MWF, 5 mg all other days   Underwent sternal wound debridement with flap and wound VAC application on 6/10. Warfarin restarted on 6/11  per MD. Plan to increase heparin infusion to 1000 units/hr without any titration per MD. Heparin level is undetectable as expected.   INR today increased from 1.1 to 1.3 - pharmacy to manage warfarin dosing as of 6/14. Hgb 8.1, plt 314. LDH WNL. No s/sx of bleeding.   Goal of Therapy:  INR: 2-2.5  Monitor platelets by anticoagulation protocol: Yes   Plan:  Increase heparin infusion to 1000 units/hr per MD Warfarin 7.5 tonight  Will get AM heparin labs (no titration)  Monitor daily INR  8/11, PharmD, BCCCP Clinical Pharmacist  Phone: [phone] 05/03/2020 10:43 AM  Please check AMION for all [facility] Pharmacy phone numbers After 10:00 PM, call Main Pharmacy [phone]

## 2020-05-03 NOTE — Progress Notes (Signed)
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   HM III LVAD implanted on 08/03/18 by Dr. Laneta Simmers under Destination Therapy criteria due to smoking status.  Pt up in the chair this morning, denies complaints.  Dr. Maren Beach  is recommending IV Ancef for 2 weeks post discharge; Rexene Alberts, ID and Dr. Gala Romney updated.   Pt reports Dr. Ulice Bold is coming by later to dc wound vac and one wound drain. Dr. Maren Beach at bedside and agrees with plan.     Vital signs: Temp: 98.4 HR: 85 Doppler Pressure: 88 Auto cuff: 117/89 (99) O2 Sat: 96% on RA Wt: 197>189>189>203.7>208.7>207.6>200.8>200.6>200.6>193.5>194.4>196.6>197>204>197 lbs   LVAD interrogation reveals:  Speed: 5700 Flow: 4.5 Power: 4.5w PI: 3.5  Alarms: none Events: 70 PI so far today; > 40 PI yesterday Hematocrit: 26  Fixed speed: 5700 Low speed limit: 5400  Drive Line: Daily dressing change per bedside RN.  Clean with betadine, no skin prep (skin intolerance). Take down outer portion of dressing. Leave Mepitel dressing in place. Cover with sterile gel and saline moistened 4 x 4 and sterile VAD dressing. RN to perform today. Continue daily dressing changes.    Labs:  LDH trend: 182>194>140>252>191>162>160>168>159>168>153>142>158>150  INR trend: 2.5>1.9>1.5>1.4>1.3>1.2>1.1>1.0>1.0>1.0>1.0>1.2>1.1>1.3  Anticoagulation Plan: -INR Goal: 2.0 - 2.5  -ASA Dose: 81 mg daily   Blood Products:  - 04/16/20>FFP x 2; PRBC x 1 - 04/17/20>>FFP x 1; PRBC x 1 - 04/27/20>>FFP x 2 - 04/29/20>>PRBC x 1  Intra-op: - 04/18/20>>PRBC x 6; Plts x 2; FFP x 2; Cell saver 2330 ml  Device: N/A  Respiratory: intubated intra op 04/18/20 - extubated 04/19/20 am   Infection:  - 04/16/20>>Covid positive - 04/16/20>>BCs staph aureus - 04/18/20>>sternal wound culture intra-op>>staph aureus - 04/20/20>>BCs negative  - 04/22/20>> COVID negative  Gtts: - Heparin 1000  units/hr   Plan/Recommendations:  1. Call VAD Coordinator if any VAD equipment or drive line issues. 2. Daily drive line dressing change per bedside RN   Hessie Diener RN VAD Coordinator  Office: [phone]  24/7 Pager: [phone]

## 2020-05-04 LAB — GLUCOSE, CAPILLARY
Glucose-Capillary: 111 mg/dL — ABNORMAL HIGH (ref 70–99)
Glucose-Capillary: 81 mg/dL (ref 70–99)
Glucose-Capillary: 106 mg/dL — ABNORMAL HIGH (ref 70–99)
Glucose-Capillary: 97 mg/dL (ref 70–99)

## 2020-05-04 LAB — BASIC METABOLIC PANEL
Anion gap: 11 (ref 5–15)
BUN: 7 mg/dL — ABNORMAL LOW (ref 8–23)
CO2: 23 mmol/L (ref 22–32)
Calcium: 9.2 mg/dL (ref 8.9–10.3)
Chloride: 108 mmol/L (ref 98–111)
Creatinine, Ser: 0.72 mg/dL (ref 0.61–1.24)
GFR calc Af Amer: >60 mL/min (ref >60)
GFR calc non Af Amer: >60 mL/min (ref >60)
Glucose, Bld: 107 mg/dL — ABNORMAL HIGH (ref 70–99)
Potassium: 4 mmol/L (ref 3.5–5.1)
Sodium: 142 mmol/L (ref 135–145)

## 2020-05-04 LAB — CBC
HCT: 25.7 % — ABNORMAL LOW (ref 39.0–52.0)
Hemoglobin: 8.1 g/dL — ABNORMAL LOW (ref 13.0–17.0)
MCH: 28.8 pg (ref 26.0–34.0)
MCHC: 31.5 g/dL (ref 30.0–36.0)
MCV: 91.5 fL (ref 80.0–100.0)
Platelets: 180 10*3/uL (ref 150–400)
RBC: 2.81 MIL/uL — ABNORMAL LOW (ref 4.22–5.81)
RDW: 13.1 % (ref 11.5–15.5)
WBC: 7.8 10*3/uL (ref 4.0–10.5)
nRBC: 0 % (ref 0.0–0.2)

## 2020-05-04 LAB — HEPARIN LEVEL (UNFRACTIONATED)
Heparin Unfractionated: <0.1 IU/mL — ABNORMAL LOW (ref 0.30–0.70)
Heparin Unfractionated: <0.1 IU/mL — ABNORMAL LOW (ref 0.30–0.70)

## 2020-05-04 LAB — PROTIME-INR
INR: 1.3 — ABNORMAL HIGH (ref 0.8–1.2)
Prothrombin Time: 16.1 seconds — ABNORMAL HIGH (ref 11.4–15.2)

## 2020-05-04 LAB — LACTATE DEHYDROGENASE: LDH: 184 U/L (ref 98–192)

## 2020-05-04 LAB — MAGNESIUM: Magnesium: 1.7 mg/dL (ref 1.7–2.4)

## 2020-05-04 MED ORDER — MAGNESIUM SULFATE 4 GM/100ML IV SOLN
4.0000 g | Freq: Once | INTRAVENOUS | Status: AC
  Administered 2020-05-04: 4 g via INTRAVENOUS
  Filled 2020-05-04: qty 100

## 2020-05-04 MED ORDER — WARFARIN SODIUM 7.5 MG PO TABS
7.5000 mg | ORAL_TABLET | Freq: Once | ORAL | Status: AC
  Administered 2020-05-04: 7.5 mg via ORAL
  Filled 2020-05-04: qty 1

## 2020-05-04 MED ORDER — SPIRONOLACTONE 25 MG PO TABS
25.0000 mg | ORAL_TABLET | Freq: Every day | ORAL | Status: DC
  Administered 2020-05-05 – 2020-05-06 (×2): 25 mg via ORAL
  Filled 2020-05-04 (×2): qty 1

## 2020-05-04 MED ORDER — SPIRONOLACTONE 12.5 MG HALF TABLET
12.5000 mg | ORAL_TABLET | Freq: Once | ORAL | Status: AC
  Administered 2020-05-04: 12.5 mg via ORAL
  Filled 2020-05-04: qty 1

## 2020-05-04 NOTE — Progress Notes (Signed)
ANTICOAGULATION CONSULT NOTE   Pharmacy Consult for heparin - per TCTS dosing, warfarin Indication: LVAD  Allergies  Allergen Reactions  . Chlorhexidine Rash  . Other Rash    Prep pads    Patient Measurements: Height: 5\' 11"  (180.3 cm) Weight: 89.4 kg (197 lb 1.5 oz) IBW/kg (Calculated) : 75.3 Heparin Dosing Weight: ~ 90 kg  Vital Signs: Temp: 98.8 F (37.1 C) (06/17 0709) Temp Source: Oral (06/17 0709) BP: 101/87 (06/17 0709) Pulse Rate: 87 (06/17 0709)  Labs: Recent Labs    05/02/20 0419 05/02/20 0419 05/02/20 0420 05/03/20 0415 05/04/20 0332  HGB 7.8*   < >  --  8.1* 8.1*  HCT 24.9*  --   --  25.9* 25.7*  PLT 310  --   --  314 180  LABPROT 14.1  --   --  16.0* 16.1*  INR 1.1  --   --  1.3* 1.3*  HEPARINUNFRC  --   --  <0.10* <0.10* <0.10*  CREATININE 0.69  --   --  0.67 0.72   < > = values in this interval not displayed.    Estimated Creatinine Clearance: 103.3 mL/min (by C-G formula based on SCr of 0.72 mg/dL).   Medical History: Past Medical History:  Diagnosis Date  . 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 Cardiomyopathy, unspecified (HCC)   . CHF (congestive heart failure) (HCC)   . Chronic back pain   . Diabetes mellitus without complication (HCC)   . Enlarged heart   . Gastric ulcer   . Gastroenteritis   . H/O degenerative disc disease   . Hypertension   . LVAD (left ventricular assist device) present (HCC)     Medications:   Infusions:  . sodium chloride    . sodium chloride    . sodium chloride 10 mL/hr at 05/04/20 0501  .  ceFAZolin (ANCEF) IV 2 g (05/04/20 0504)  . heparin 1,200 Units/hr (05/04/20 05/06/20)    Assessment: 59 yo male with LVAD - HM3 + ASA 81mg  daily, MSSA bacteremia and  driveline infection.  Chronic Coumadin has been on hold for procedures - flap 6/10.    PTA dose of warfarin: 7.5 mg MWF, 5 mg all other days   Underwent sternal wound debridement with flap and wound VAC application on 6/10. Warfarin restarted on 6/11  per MD. Plan to increase heparin infusion to 1200 units/hr without any futher titration except for as per MD. Heparin level is undetectable as expected.   INR today increased from 1.1 to 1.3 - pharmacy to manage warfarin dosing as of 6/14. Hgb 8.1, plt 314. LDH WNL. No s/sx of bleeding.   Goal of Therapy:  INR: 2-2.5  Monitor platelets by anticoagulation protocol: Yes   Plan:  Increase heparin infusion to 1200 units/hr per MD Check heparin level in 6 hrs Warfarin 7.5 tonight  Will get AM heparin labs (no titration)  Monitor daily INR  8/11, 7/14, [facility] Clinical Pharmacist  05/04/2020 11:17 AM   [facility] pharmacy phone numbers are listed on [email or web address]

## 2020-05-04 NOTE — Progress Notes (Signed)
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   HM III LVAD implanted on 08/03/18 by Dr. Laneta Simmers under Destination Therapy criteria due to smoking status.  Pt up in the chair this morning, denies complaints.  Dr. Maren Beach  is recommending IV Ancef for 2 weeks post discharge; Rexene Alberts, ID and Dr. Gala Romney aware.    Vital signs: Temp: 98.2 HR: 87 Doppler Pressure: not done Auto cuff: 101/87 (94) O2 Sat: 100% on RA Wt: 197>189>189>203.7>...>193.5>194.4>196.6>197>204>197> lbs   LVAD interrogation reveals:  Speed: 5700 Flow: 4.1 Power: 4.3w PI: 5.3  Alarms: none Events: 70 PI so far today; > 40 PI yesterday Hematocrit: 26  Fixed speed: 5700 Low speed limit: 5400  Drive Line: Daily dressing change per bedside RN.  Clean with betadine, no skin prep (skin intolerance). Take down outer portion of dressing. Leave Mepitel dressing in place. Cover with sterile gel and saline moistened 4 x 4 and sterile VAD dressing. RN to perform today. Continue daily dressing changes.    Labs:  LDH trend: 182>194>140>252>...>159>168>153>142>158>150>184  INR trend: 2.5>1.9>1.5>1.4>1.3>1.2>1.1>1.0>1.0>1.0>1.0>1.2>1.1>1.3  Anticoagulation Plan: -INR Goal: 2.0 - 2.5  -ASA Dose: 81 mg daily   Blood Products:  - 04/16/20>FFP x 2; PRBC x 1 - 04/17/20>>FFP x 1; PRBC x 1 - 04/27/20>>FFP x 2 - 04/29/20>>PRBC x 1  Intra-op: - 04/18/20>>PRBC x 6; Plts x 2; FFP x 2; Cell saver 2330 ml  Device: N/A  Respiratory: intubated intra op 04/18/20 - extubated 04/19/20 am   Infection:  - 04/16/20>>Covid positive - 04/16/20>>BCs staph aureus - 04/18/20>>sternal wound culture intra-op>>staph aureus - 04/20/20>>BCs negative  - 04/22/20>> COVID negative  Gtts: - Heparin 1200 units/hr   Plan/Recommendations:  1. Call VAD Coordinator if any VAD equipment or drive line issues. 2. Daily drive line dressing change per bedside RN   [name] RN VAD  Coordinator  Office: [phone]  24/7 Pager: [phone]

## 2020-05-04 NOTE — Progress Notes (Addendum)
Patient ID: Theodore Welch, male   DOB: 04/25/1961, 58 y.o.   MRN: [id number]   Advanced Heart Failure VAD Team Note  PCP-Cardiologist:  Dr. Shirlee Latch  Subjective:    Events: - Admitted w/ subxiphoid abscess and cellulitis + bleeding from wound site. Admit Hgb 7.4. COVID +.   Speed increased back to 5700.  - S/p multiple transfusions. Has received total of 3 units PRBCs and 5 units FFP.  - Blood cultures 1/2 positive for MSSA.  On cefazolin and rifabutin added .   - Went to OR 6/1 for I&D of chest wound + repair of VAD outflow graft. Required fem-fem cardiopulmonary bypass. Wound vac placed. Back to OR 6/4 for debridement and wound vac change.  - Back to OR 6/10 for rectus muscle flap over wound and wound VAC.   INR unchanged.  Remains subtherapeutic at 1.3. Continues w/ heparin bridge. Hgb stable 8.1.  Feels well today. Has ambulated several times. Had BM earlier. Reading news paper. No complaints.    LVAD INTERROGATION:  HeartMate III LVAD:   Flow 4.7 liters/min, speed 5700, power 4.4 PI 4.7  Objective:    Vital Signs:   Temp:  [98.4 F (36.9 C)-98.8 F (37.1 C)] 98.8 F (37.1 C) (06/17 0709) Pulse Rate:  [79-87] 87 (06/17 0709) Resp:  [20-24] 24 (06/17 0457) BP: (101-121)/(85-87) 101/87 (06/17 0709) SpO2:  [96 %-100 %] 100 % (06/17 0709) Weight:  [89.4 kg] 89.4 kg (06/17 0700) Last BM Date: 05/03/20 Mean arterial Pressure 80s  Intake/Output:   Intake/Output Summary (Last 24 hours) at 05/04/2020 1102 Last data filed at 05/04/2020 0600 Gross per 24 hour  Intake 1633.41 ml  Output 1945 ml  Net -311.59 ml     Physical Exam   General:  Well appearing. No respiratory difficulty HEENT: normal Neck: supple. no JVD. Carotids 2+ bilat; no bruits. No lymphadenopathy or thyromegaly appreciated. Cor: + LVAD HUM  Lungs: CTAB  Cardiac: + LVAD hum. + Sternal wound vac w/ good seal  Abdomen: soft, nontender, nondistended. No hepatosplenomegaly. No bruits or masses. Good bowel  sounds. Driveline site clean. Anchor in place. Extremities: no cyanosis, clubbing, rash, edema.   Neuro: alert & oriented x 3, cranial nerves grossly intact. moves all 4 extremities w/o difficulty. Affect pleasant.   Telemetry  NSR  70s Personally reviewed  Labs   Basic Metabolic Panel: Recent Labs  Lab 04/30/20 0455 04/30/20 0455 04/30/20 0547 05/01/20 0500 05/01/20 0500 05/02/20 0419 05/03/20 0415 05/04/20 0332  NA 141  --   --  142  --  143 141 142  K 3.3*  --   --  3.9  --  3.6 3.7 4.0  CL 110  --   --  111  --  112* 111 108  CO2 24  --   --  24  --  23 23 23   GLUCOSE 110*  --   --  107*  --  108* 111* 107*  BUN 5*  --   --  5*  --  7* 7* 7*  CREATININE 0.71  --   --  0.74  --  0.69 0.67 0.72  CALCIUM 8.7*   < >  --  8.7*   < > 8.7* 8.8* 9.2  MG  --   --  1.6*  --   --  1.8  --  1.7   < > = values in this interval not displayed.    Liver Function Tests: Recent Labs  Lab 04/29/20 0500  AST 11*  ALT 7  ALKPHOS 61  BILITOT 0.7  PROT 5.7*  ALBUMIN 2.8*   No results for input(s): LIPASE, AMYLASE in the last 168 hours. No results for input(s): AMMONIA in the last 168 hours.  CBC: Recent Labs  Lab 04/30/20 0455 05/01/20 0500 05/02/20 0419 05/03/20 0415 05/04/20 0332  WBC 9.2 8.9 9.4 8.1 7.8  HGB 8.0* 7.9* 7.8* 8.1* 8.1*  HCT 25.4* 25.1* 24.9* 25.9* 25.7*  MCV 92.4 93.7 92.9 92.8 91.5  PLT 242 295 310 314 180    INR: Recent Labs  Lab 04/30/20 0455 05/01/20 0500 05/02/20 0419 05/03/20 0415 05/04/20 0332  INR 1.2 1.2 1.1 1.3* 1.3*    Other results:  EKG:    Imaging   No results found.   Medications:     Scheduled Medications: . sodium chloride   Intravenous Once  . aspirin  81 mg Oral Daily  . ferrous fumarate-b12-vitamic C-folic acid  1 capsule Oral BID PC  . gabapentin  600 mg Oral QID  . hydrALAZINE  25 mg Oral Q8H  . insulin aspart  0-24 Units Subcutaneous TID AC & HS  . mupirocin cream   Topical BID  . pantoprazole  40 mg  Oral Daily  . rifabutin  300 mg Oral Daily  . sacubitril-valsartan  1 tablet Oral BID  . sodium chloride flush  10-40 mL Intracatheter Q12H  . spironolactone  12.5 mg Oral Daily  . thiamine  100 mg Oral Daily  . varenicline  1 mg Oral Daily  . vitamin B-12  1,000 mcg Oral Daily  . Warfarin - Pharmacist Dosing Inpatient   Does not apply q1600  . zinc sulfate  220 mg Oral BID    Infusions: . sodium chloride    . sodium chloride    . sodium chloride 10 mL/hr at 05/04/20 0501  .  ceFAZolin (ANCEF) IV 2 g (05/04/20 0504)  . heparin 1,200 Units/hr (05/04/20 0924)    PRN Medications: Place/Maintain arterial line **AND** sodium chloride, sodium chloride, sodium chloride, acetaminophen, bisacodyl, fluticasone, hydrALAZINE, Ipratropium-Albuterol, loperamide, ondansetron (ZOFRAN) IV, oxyCODONE, oxyCODONE, sodium chloride flush, sodium chloride flush, traMADol, zolpidem   Assessment/Plan:    1. 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 He previously required debridement of lower sternalwound with woundVAC therapy and IV antibiotics.This was completed with 3 surgical procedures including debridement and ACell application in March 2021.  More recently, the lower sternal wound reopened with drainage again culturing MSSA and he had been on cephalexin + doxycycline.  The day of admission, he developed persistent bleeding from the wound site and came to ER, bleeding now stopped with suture.  Bleeding again 5/30, now stopped again. Got an additional 1 units PRBCs on 5/31 and again 6/1. Overall he has received 3 units PRBCs + 5 units FFP.  ID consulted and switched antibiotics to cefazolin.  1/2 blood cultures + for MSSA. CT chest/abd/pelvis--> Stable small amount of soft tissue tracking along the proximal drive line in the subcutaneous ventral left abdominal wall.  S/p I&D + wound vac placement on 6/1.   Back to OR for debridement and wound vac  change on 6/4. - Back to OR for rectus muscle flap to cover sternal wound + wound vac on 6/10.  - Wound vac site looks good.  - Continue cefazolin IV + rifabutin.  - INR 1.3 today, continue warfarin w/ heparin bridge until INR reaches 1.5  - Discussed dosing with PharmD personally.  -  Continue incentive spirometer.  2. COVID-19 infection: Asymptomatic. No cough/dyspnea.  Repeat COVID-19 test negative 6/5. - He is off isolation.    3. 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 LVAD parameters stable. MAPs low initially, started on phenylephrine (may have been at least partly vagal as very diaphoretic with bleeding).  Speed back up to 5700 rpm. MAPs recently low after restarting home meds  - Volume status stable. CVP 6 - MAPs in the 90s - Increase spiro back up to 25 daily.  - Continue Entresto 24-26 mg twice a day. Consider titrating back to 97-103 if MAPs remain elevated.  -Continue hydralazine 25 tid. Can use prn IV hydral for HTN - VAD interrogated personally. Parameters stable. 4. Smoking:He has cut back a lot, still taking Chantix. Encouraged to quit. 5. H/o RLE DVT: on coumadin  6. OSA: Continue CPAP.  7. Hyperlipidemia: Atorvastatin.  8. Type II diabetes:SSI 9. Anemia, Acute blood loss: -  Hgb down, 10.4>>9.2>>8.4 > 7.1 > 8.4 -> 8.0->7.9 ->7.8->8.1->8.1 - Transfuse if Hgb< 7.5  - Expected surgical blood loss. Monitor closely 10. Post-op ileus. - resolved. Now off PCA 11. Hypokalemia/hypomag - K 4.0 today  - Mg 1.7. Will supp w/ 4 mg MgSO4 x 1    I reviewed the LVAD parameters from today, and compared the results to the patient's prior recorded data.  No programming changes were made.  The LVAD is functioning within specified parameters.  The patient performs LVAD self-test daily.  LVAD interrogation was negative for any significant power changes, alarms or PI events/speed drops.  LVAD equipment check completed and is in good working  order.  Back-up equipment present.   LVAD education done on emergency procedures and precautions and reviewed exit site care.  Length of Stay: [place] NP-C  05/04/2020 11:02 AM  VAD Team --- VAD ISSUES ONLY--- Pager [phone] (7am - 7am)  Advanced Heart Failure Team  Pager [phone] (M-F; 7a - 4p)  Please contact [facility] for night-coverage after hours (4p -7a ) and weekends on [email or web address]  Patient seen and examined with the above-signed Advanced Practice Provider and/or Housestaff. I personally reviewed laboratory data, imaging studies and relevant notes. I independently examined the patient and formulated the important aspects of the plan. I have edited the note to reflect any of my changes or salient points. I have personally discussed the plan with the patient and/or family.  Feeling good. Ambualting. On IV abx. No f/c. INR still low. Rifabutin likely contributing. On heparin. No bleeding. Wound site ok.   General:  NAD.  HEENT: normal  Neck: supple. JVP not elevated.  Carotids 2+ bilat; no bruits. No lymphadenopathy or thryomegaly appreciated. Cor: LVAD hum.  Lungs: Clear. Abdomen: obese soft, nontender, non-distended. Dressing and vac ok. No hepatosplenomegaly. No bruits or masses. Good bowel sounds. Driveline site clean. Anchor in place.  Extremities: no cyanosis, clubbing, rash. Warm no edema  Neuro: alert & oriented x 3. No focal deficits. Moves all 4 without problem   Continue heparin/warfarin. Discussed dosing with PharmD personally. Continue iv abx. VAD interrogated personally. Parameters stable. Wound site looks good. Continue abx.   Glori Bickers, MD  6:25 PM

## 2020-05-04 NOTE — Progress Notes (Addendum)
7 Days Post-Op Procedure(s) (LRB): STERNAL WOUND DEBRIDEMENT (N/A) MUSCLE FLAP TO STERNUM (N/A) APPLICATION OF A-CELL OF CHEST (N/A) APPLICATION OF PREVENA INCISIONAL WOUND VAC (N/A) Subjective: Patient doing well Surgical sites healing-Praveena changed over lower sternal incision, one JP remains in the space created by the right rectus flap with minimal output.  Heparin level undetectable, heparin dose increased to 1200 units/h as Coumadin load progresses. Patient now on rifabutin which may be influencing Coumadin metabolism Objective: Vital signs in last 24 hours: Temp:  [98.4 F (36.9 C)-98.8 F (37.1 C)] 98.8 F (37.1 C) (06/17 0709) Pulse Rate:  [79-87] 87 (06/17 0709) Cardiac Rhythm: Normal sinus rhythm (06/17 0719) Resp:  [20-24] 24 (06/17 0457) BP: (101-121)/(85-87) 101/87 (06/17 0709) SpO2:  [96 %-100 %] 100 % (06/17 0709) Weight:  [89.4 kg] 89.4 kg (06/17 0700)  Hemodynamic parameters for last 24 hours: CVP:  [2 mmHg-5 mmHg] 5 mmHg  Intake/Output from previous day: 06/16 0701 - 06/17 0700 In: 1873.4 [P.O.:1440; I.V.:233.4; IV Piggyback:200] Out: 1945 [Urine:1925; Drains:20] Intake/Output this shift: No intake/output data recorded.  Alert and comfortable Lungs clear Normal VAD parameters-VAD flow 4.7 L Prevena wound VAC compressed Lab Results: Recent Labs    05/03/20 0415 05/04/20 0332  WBC 8.1 7.8  HGB 8.1* 8.1*  HCT 25.9* 25.7*  PLT 314 180   BMET:  Recent Labs    05/03/20 0415 05/04/20 0332  NA 141 142  K 3.7 4.0  CL 111 108  CO2 23 23  GLUCOSE 111* 107*  BUN 7* 7*  CREATININE 0.67 0.72  CALCIUM 8.8* 9.2    PT/INR:  Recent Labs    05/04/20 0332  LABPROT 16.1*  INR 1.3*   ABG    Component Value Date/Time   PHART 7.478 (H) 04/26/2020 0523   HCO3 26.5 04/26/2020 0523   TCO2 27 04/19/2020 0832   ACIDBASEDEF 1.0 04/18/2020 1513   O2SAT 81.2 04/26/2020 0523   CBG (last 3)  Recent Labs    05/03/20 1640 05/03/20 2113 05/04/20 0617   GLUCAP 99 99 111*    Assessment/Plan: S/P Procedure(s) (LRB): STERNAL WOUND DEBRIDEMENT (N/A) MUSCLE FLAP TO STERNUM (N/A) APPLICATION OF A-CELL OF CHEST (N/A) APPLICATION OF PREVENA INCISIONAL WOUND VAC (N/A) Continue daily driveline exit site dressings Continue heparin bridge until INR > 1.5   LOS: 18 days    [name] 05/04/2020

## 2020-05-05 LAB — PROTIME-INR
INR: 1.6 — ABNORMAL HIGH (ref 0.8–1.2)
INR: 1.5 — ABNORMAL HIGH (ref 0.8–1.2)
INR: 1.5 — ABNORMAL HIGH (ref 0.8–1.2)
Prothrombin Time: 18.5 seconds — ABNORMAL HIGH (ref 11.4–15.2)
Prothrombin Time: 17.7 seconds — ABNORMAL HIGH (ref 11.4–15.2)
Prothrombin Time: 17.7 seconds — ABNORMAL HIGH (ref 11.4–15.2)

## 2020-05-05 LAB — HEPARIN LEVEL (UNFRACTIONATED): Heparin Unfractionated: <0.1 IU/mL — ABNORMAL LOW (ref 0.30–0.70)

## 2020-05-05 LAB — BASIC METABOLIC PANEL
Anion gap: 8 (ref 5–15)
BUN: 9 mg/dL (ref 8–23)
CO2: 25 mmol/L (ref 22–32)
Calcium: 9 mg/dL (ref 8.9–10.3)
Chloride: 109 mmol/L (ref 98–111)
Creatinine, Ser: 0.77 mg/dL (ref 0.61–1.24)
GFR calc Af Amer: >60 mL/min (ref >60)
GFR calc non Af Amer: >60 mL/min (ref >60)
Glucose, Bld: 109 mg/dL — ABNORMAL HIGH (ref 70–99)
Potassium: 4.2 mmol/L (ref 3.5–5.1)
Sodium: 142 mmol/L (ref 135–145)

## 2020-05-05 LAB — GLUCOSE, CAPILLARY
Glucose-Capillary: 115 mg/dL — ABNORMAL HIGH (ref 70–99)
Glucose-Capillary: 77 mg/dL (ref 70–99)
Glucose-Capillary: 90 mg/dL (ref 70–99)

## 2020-05-05 LAB — CBC
HCT: 25.8 % — ABNORMAL LOW (ref 39.0–52.0)
Hemoglobin: 8 g/dL — ABNORMAL LOW (ref 13.0–17.0)
MCH: 28.8 pg (ref 26.0–34.0)
MCHC: 31 g/dL (ref 30.0–36.0)
MCV: 92.8 fL (ref 80.0–100.0)
Platelets: 356 10*3/uL (ref 150–400)
RBC: 2.78 MIL/uL — ABNORMAL LOW (ref 4.22–5.81)
RDW: 13.2 % (ref 11.5–15.5)
WBC: 7.2 10*3/uL (ref 4.0–10.5)
nRBC: 0 % (ref 0.0–0.2)

## 2020-05-05 LAB — LACTATE DEHYDROGENASE: LDH: 168 U/L (ref 98–192)

## 2020-05-05 MED ORDER — SACUBITRIL-VALSARTAN 49-51 MG PO TABS
1.0000 | ORAL_TABLET | Freq: Two times a day (BID) | ORAL | Status: DC
  Administered 2020-05-05 – 2020-05-06 (×2): 1 via ORAL
  Filled 2020-05-05 (×3): qty 1

## 2020-05-05 MED ORDER — WARFARIN SODIUM 10 MG PO TABS
10.0000 mg | ORAL_TABLET | Freq: Once | ORAL | Status: AC
  Administered 2020-05-05: 10 mg via ORAL

## 2020-05-05 MED ORDER — SACUBITRIL-VALSARTAN 49-51 MG PO TABS: 1.0000 | ORAL_TABLET | Freq: Two times a day (BID) | ORAL | 0 refills | Status: DC

## 2020-05-05 MED ORDER — OXYCODONE HCL 5 MG PO TABS: 5.0000 mg | ORAL_TABLET | ORAL | 0 refills | Status: DC | PRN

## 2020-05-05 MED ORDER — WARFARIN SODIUM 10 MG PO TABS
10.0000 mg | ORAL_TABLET | Freq: Once | ORAL | Status: DC
  Filled 2020-05-05: qty 1

## 2020-05-05 MED ORDER — RIFABUTIN 150 MG PO CAPS: 300.0000 mg | ORAL_CAPSULE | Freq: Every day | ORAL | 0 refills | Status: AC

## 2020-05-05 MED FILL — ENTRESTO 49 MG-51 MG TABLET: 49-51 | 30 days supply | Qty: 60 | Fill #0

## 2020-05-05 MED FILL — oxyCODONE HCL 5 MG TABS: 5 | 5 days supply | Qty: 30 | Fill #0

## 2020-05-05 MED FILL — RIFABUTIN 150 MG CAPSULE: 150 | 18 days supply | Qty: 36 | Fill #0

## 2020-05-05 NOTE — Progress Notes (Addendum)
Patient ID: Theodore Welch, male   DOB: 17-Sep-1961, 59 y.o.   MRN: [id number]   Advanced Heart Failure VAD Team Note  PCP-Cardiologist:  Dr. Shirlee Latch  Subjective:    Events: - Admitted w/ subxiphoid abscess and cellulitis + bleeding from wound site. Admit Hgb 7.4. COVID +.   Speed increased back to 5700.  - S/p multiple transfusions. Has received total of 3 units PRBCs and 5 units FFP.  - Blood cultures 1/2 positive for MSSA.  On cefazolin and rifabutin added .   - Went to OR 6/1 for I&D of chest wound + repair of VAD outflow graft. Required fem-fem cardiopulmonary bypass. Wound vac placed. Back to OR 6/4 for debridement and wound vac change.  - Back to OR 6/10 for rectus muscle flap over wound and wound VAC.   Frustrated wants to go home.    LVAD INTERROGATION:  HeartMate III LVAD:   Flow 4.5liters/min, speed 5700, power 4 PI 3.2   Objective:    Vital Signs:   Temp:  [98 F (36.7 C)-98.9 F (37.2 C)] 98.9 F (37.2 C) (06/18 0356) Pulse Rate:  [80-88] 80 (06/18 0356) Resp:  [15-21] 15 (06/18 0356) BP: (103-119)/(82-87) 115/82 (06/18 0356) SpO2:  [98 %-100 %] 99 % (06/18 0356) Weight:  [89.4 kg] 89.4 kg (06/18 0623) Last BM Date: 05/03/20 Mean arterial Pressure 90s   Intake/Output:   Intake/Output Summary (Last 24 hours) at 05/05/2020 0928 Last data filed at 05/05/2020 0617 Gross per 24 hour  Intake 1879.93 ml  Output 1407 ml  Net 472.93 ml     Physical Exam   Physical Exam: GENERAL: No acute distress. Sitting in the chair.  HEENT: normal  NECK: Supple, JVP  7-8 .  2+ bilaterally, no bruits.  No lymphadenopathy or thyromegaly appreciated.   CARDIAC:  Mechanical heart sounds with LVAD hum present. Sternal VAC  LUNGS:  Clear to auscultation bilaterally.  ABDOMEN:  Soft, round, nontender, positive bowel sounds x4.     LVAD exit site: Dressing dry and intact.  No erythema or drainage.  Stabilization device present and accurately applied.  Driveline dressing is being  changed daily per sterile technique. EXTREMITIES:  Warm and dry, no cyanosis, clubbing, rash or edema . RUE PICC  NEUROLOGIC:  Alert and oriented x 3.    No aphasia.  No dysarthria.  Affect pleasant.      Telemetry  NSR 70s   Labs   Basic Metabolic Panel: Recent Labs  Lab 04/30/20 0455 04/30/20 0547 05/01/20 0500 05/01/20 0500 05/02/20 0419 05/02/20 0419 05/03/20 0415 05/04/20 0332 05/05/20 0312  NA   < >  --  142  --  143  --  141 142 142  K   < >  --  3.9  --  3.6  --  3.7 4.0 4.2  CL   < >  --  111  --  112*  --  111 108 109  CO2   < >  --  24  --  23  --  23 23 25   GLUCOSE   < >  --  107*  --  108*  --  111* 107* 109*  BUN   < >  --  5*  --  7*  --  7* 7* 9  CREATININE   < >  --  0.74  --  0.69  --  0.67 0.72 0.77  CALCIUM   < >  --  8.7*   < > 8.7*   < >  8.8* 9.2 9.0  MG  --  1.6*  --   --  1.8  --   --  1.7  --    < > = values in this interval not displayed.    Liver Function Tests: Recent Labs  Lab 04/29/20 0500  AST 11*  ALT 7  ALKPHOS 61  BILITOT 0.7  PROT 5.7*  ALBUMIN 2.8*   No results for input(s): LIPASE, AMYLASE in the last 168 hours. No results for input(s): AMMONIA in the last 168 hours.  CBC: Recent Labs  Lab 05/01/20 0500 05/02/20 0419 05/03/20 0415 05/04/20 0332 05/05/20 0312  WBC 8.9 9.4 8.1 7.8 7.2  HGB 7.9* 7.8* 8.1* 8.1* 8.0*  HCT 25.1* 24.9* 25.9* 25.7* 25.8*  MCV 93.7 92.9 92.8 91.5 92.8  PLT 295 310 314 180 356    INR: Recent Labs  Lab 05/01/20 0500 05/02/20 0419 05/03/20 0415 05/04/20 0332 05/05/20 0312  INR 1.2 1.1 1.3* 1.3* 1.6*    Other results:  EKG:    Imaging   No results found.   Medications:     Scheduled Medications: . sodium chloride   Intravenous Once  . aspirin  81 mg Oral Daily  . ferrous [id number] C-folic acid  1 capsule Oral BID PC  . gabapentin  600 mg Oral QID  . hydrALAZINE  25 mg Oral Q8H  . insulin aspart  0-24 Units Subcutaneous TID AC & HS  . mupirocin cream    Topical BID  . pantoprazole  40 mg Oral Daily  . rifabutin  300 mg Oral Daily  . sacubitril-valsartan  1 tablet Oral BID  . sodium chloride flush  10-40 mL Intracatheter Q12H  . spironolactone  25 mg Oral Daily  . thiamine  100 mg Oral Daily  . varenicline  1 mg Oral Daily  . vitamin B-12  1,000 mcg Oral Daily  . Warfarin - Pharmacist Dosing Inpatient   Does not apply q1600  . zinc sulfate  220 mg Oral BID    Infusions: . sodium chloride    . sodium chloride    . sodium chloride 10 mL/hr at 05/04/20 0501  .  ceFAZolin (ANCEF) IV 2 g (05/05/20 0617)  . heparin 1,200 Units/hr (05/04/20 0924)    PRN Medications: Place/Maintain arterial line **AND** sodium chloride, sodium chloride, sodium chloride, acetaminophen, bisacodyl, fluticasone, hydrALAZINE, Ipratropium-Albuterol, loperamide, ondansetron (ZOFRAN) IV, oxyCODONE, oxyCODONE, sodium chloride flush, sodium chloride flush, traMADol, zolpidem   Assessment/Plan:    1. 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 He previously required debridement of lower sternalwound with woundVAC therapy and IV antibiotics.This was completed with 3 surgical procedures including debridement and ACell application in March 1025.  More recently, the lower sternal wound reopened with drainage again culturing MSSA and he had been on cephalexin + doxycycline.  The day of admission, he developed persistent bleeding from the wound site and came to ER, bleeding now stopped with suture.  Bleeding again 5/30, now stopped again. Got an additional 1 units PRBCs on 5/31 and again 6/1. Overall he has received 3 units PRBCs + 5 units FFP.  ID consulted and switched antibiotics to cefazolin.  1/2 blood cultures + for MSSA. CT chest/abd/pelvis--> Stable small amount of soft tissue tracking along the proximal drive line in the subcutaneous ventral left abdominal wall.  S/p I&D + wound vac placement on 6/1.   Back to OR  for debridement and wound vac change on 6/4. - Back to  OR for rectus muscle flap to cover sternal wound + wound vac on 6/10.  - Wound vac site looks good.  - Continue cefazolin IV + rifabutin until 05/24/2020 then he will be on keflex for life long suppression.  - INR 1.6 today, continue warfarin w/ heparin bridge until INR reaches 1.8.  - Repeat INR at 1200 if up will possibly send home. - Discussed dosing with PharmD personally.  - Continue incentive spirometer.  2. COVID-19 infection: Asymptomatic. No cough/dyspnea.  Repeat COVID-19 test negative 6/5. - He is off isolation.    3. 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 LVAD parameters stable. MAPs low initially, started on phenylephrine (may have been at least partly vagal as very diaphoretic with bleeding).  Speed back up to 5700 rpm. MAPs recently low after restarting home meds  - Maps 100s.  - Continue spiro 25 daily.  - Increase entresto to 49-51 mg twice a day.  - Renal function stable.  -Continue hydralazine 25 tid. Can use prn IV hydral for HTN - VAD interrogated personally. Parameters stable. 4. Smoking:He has cut back a lot, still taking Chantix. Encouraged to quit. 5. H/o RLE DVT: on coumadin  6. OSA: Continue CPAP.  7. Hyperlipidemia: Atorvastatin.  8. Type II diabetes:SSI 9. Anemia, Acute blood loss: -  Hgb 8 today.  - Transfuse if Hgb< 7.5  - Expected surgical blood loss. Monitor closely 10. Post-op ileus. - resolved. Now off PCA 11. Hypokalemia/hypomag - K 4.2 today   -He has home Prevena in place.    TOC set up the following.  Marland Kitchen                    HH Arranged: RN, IV Antibiotics HH Agency: [facility] Date HH Agency Contacted: 05/02/20    I reviewed the LVAD parameters from today, and compared the results to the patient's prior recorded data.  No programming changes were made.  The LVAD is functioning within specified parameters.  The  patient performs LVAD self-test daily.  LVAD interrogation was negative for any significant power changes, alarms or PI events/speed drops.  LVAD equipment check completed and is in good working order.  Back-up equipment present.   LVAD education done on emergency procedures and precautions and reviewed exit site care.  Length of Stay: 19  Amy Clegg NP-C  05/05/2020 9:28 AM  VAD Team --- VAD ISSUES ONLY--- Pager [phone] (7am - 7am)  Advanced Heart Failure Team  Pager [phone] (M-F; 7a - 4p)  Please contact [facility] for night-coverage after hours (4p -7a ) and weekends on [email or web address]  Patient seen and examined with the above-signed Advanced Practice Provider and/or Housestaff. I personally reviewed laboratory data, imaging studies and relevant notes. I independently examined the patient and formulated the important aspects of the plan. I have edited the note to reflect any of my changes or salient points. I have personally discussed the plan with the patient and/or family.  Frutstrated. Wants to go home. Otherwise feels ok. Eating ok. Walking halls. On heparin/coumadin. INR 1.5  General:  NAD.  HEENT: normal  Neck: supple. JVP not elevated.  Carotids 2+ bilat; no bruits. No lymphadenopathy or thryomegaly appreciated. Cor: LVAD hum.  Lungs: Clear. Abdomen: obese soft, nontender, + distended. Wound vac ok No hepatosplenomegaly. No bruits or masses. Good bowel sounds. Driveline site clean. Anchor in place.  Extremities: no cyanosis, clubbing, rash. Warm no edema  Neuro: alert & oriented  x 3. No focal deficits. Moves all 4 without problem   Remains on IV abx and heparin/coumadin. VAD interrogated personally. Parameters stable. INR 1.5. Home tomorrow when INR 1.6 or greater   Arvilla Meres, MD  5:07 PM

## 2020-05-05 NOTE — Progress Notes (Signed)
ANTICOAGULATION CONSULT NOTE   Pharmacy Consult for heparin - per [facility] dosing, warfarin Indication: LVAD  Allergies  Allergen Reactions  . Chlorhexidine Rash  . Other Rash    Prep pads    Patient Measurements: Height: 5\' 11"  (180.3 cm) Weight: 89.4 kg (197 lb 1.5 oz) IBW/kg (Calculated) : 75.3 Heparin Dosing Weight: ~ 90 kg  Vital Signs: Temp: 98.1 F (36.7 C) (06/18 1127) Temp Source: Oral (06/18 1127) BP: 120/97 (06/18 1127) Pulse Rate: 80 (06/18 1127)  Labs: Recent Labs    05/03/20 0415 05/03/20 0415 05/04/20 0332 05/04/20 1933 05/05/20 0312 05/05/20 1157  HGB 8.1*   < > 8.1*  --  8.0*  --   HCT 25.9*  --  25.7*  --  25.8*  --   PLT 314  --  180  --  356  --   LABPROT 16.0*   < > 16.1*  --  18.5* 17.7*  INR 1.3*   < > 1.3*  --  1.6* 1.5*  HEPARINUNFRC <0.10*   < > <0.10* <0.10* <0.10*  --   CREATININE 0.67  --  0.72  --  0.77  --    < > = values in this interval not displayed.    Estimated Creatinine Clearance: 103.3 mL/min (by C-G formula based on SCr of 0.77 mg/dL).   Medical History: Past Medical History:  Diagnosis Date  . 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 Cardiomyopathy, unspecified (HCC)   . CHF (congestive heart failure) (HCC)   . Chronic back pain   . Diabetes mellitus without complication (HCC)   . Enlarged heart   . Gastric ulcer   . Gastroenteritis   . H/O degenerative disc disease   . Hypertension   . LVAD (left ventricular assist device) present (HCC)     Medications:   Infusions:  . sodium chloride    . sodium chloride    . sodium chloride 10 mL/hr at 05/04/20 0501  .  ceFAZolin (ANCEF) IV 2 g (05/05/20 1416)  . heparin 1,200 Units/hr (05/04/20 05/06/20)    Assessment: 59 yo male with LVAD - HM3 + ASA 81mg  daily, MSSA bacteremia and  driveline infection.  Chronic Coumadin has been on hold for procedures - flap 6/10.    PTA dose of warfarin: 7.5 mg MWF, 5 mg all other days   Underwent sternal wound debridement with flap and  wound VAC application on 6/10. Warfarin restarted on 6/11 per MD. Plan to increase heparin infusion to 1200 units/hr without any futher titration except for as per MD. Heparin level is undetectable as expected.   INR today increased from 1.1 to 1.5 - pharmacy to manage warfarin dosing as of 6/14. Hgb 8, plt 356. LDH WNL. No s/sx of bleeding.   Goal of Therapy:  INR: 2-2.5  Monitor platelets by anticoagulation protocol: Yes   Plan:  Continue IV heparin at 1200 units/hr per MD Warfarin 10 mg x 1 tonight  Will get AM heparin labs (no titration)  Repeating INR at 1600 pm.  If 1.6 or greater can likely go home today per Dr. 8/11.  7/14, Gala Romney, BCCP Clinical Pharmacist  05/05/2020 2:33 PM   [facility] pharmacy phone numbers are listed on [email or web address]

## 2020-05-05 NOTE — Discharge Summary (Signed)
Advanced [facility] Failure Team  Discharge Summary   Patient ID: Theodore Welch MRN: [id number], DOB/AGE: 1961/05/10 59 y.o. Admit date: 04/16/2020 D/C date:     05/06/2020   Primary Discharge Diagnoses:  1. Recurrent MSSA driveline infection with bleeding 2. COVID-19 infection 3. 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 4. Smoking:He has cut back a lot, still taking Chantix. Encouraged to quit. 5.H/oRLE DVT 6. OSA 7. Hyperlipidemia 8. Type II diabetes 9. Anemia, Acute blood loss: 10. Post-op ileus. 11. Hypokalemia/hypomag  Interval history for 05/06/2020:   Feels good. Eager to go home. INR 1.9. Heparin off this am. No SOB. Appetite good.   LVAD Interrogation HM III Speed: 5700 Flow: 4.6 PI: 3.6 Power: 4.0. VAD interrogated personally. Parameters stable.  Hospital Course:  Theodore Welch is a 59 year old with history of nonischemic cardiomyopathy, RLE DVT, cirrhosis, smoking, and OSA returns for followup of CHF/LVAD placement.   He has ongoing issues with driveline infections. Admitted with worsening driveline infection despite oral antibiotics. He had been on cephalexin and doxycycline. On admit he was positive for COVID but asymptomatic. Initially on airborne precautions but later removed with negative COVID test on 04/22/20.   He underwent reconctructive surgery with rectus muscle flap to cover sternal wound. ID consulted and followed closeley. Plan to continue cefazolin IV + rifabutin until 05/24/2020 then he will be on keflex for life long suppression. He has follow up with ID and Plasitcs Team. He will empty JP drain. D/C with prevena vac in place. He is also being discharged with RUE PICC line.   See below for detailed problem list. He will continue to be followed closely in the VAD clinic.  1. 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 He previously required debridement of lower  sternalwound with woundVAC therapy and IV antibiotics.This was completed with 3 surgical procedures including debridement and ACell application in March 7124.More recently, the lower sternal wound reopened with drainage again culturing MSSA and he had been on cephalexin + doxycycline. The day of admission, he developed persistent bleeding from the wound site and came to ER, bleeding now stopped with suture.  Bleeding again 5/30, now stopped again. Got an additional 1 units PRBCs on 5/31 and again 6/1. Overall he has received 3 units PRBCs + 5 units FFP.  ID consulted and switched antibiotics to cefazolin. 1/2 blood cultures + for MSSA. CT chest/abd/pelvis--> Stable small amount of soft tissue tracking along the proximal drive line in the subcutaneous ventral left abdominal wall.  S/p I&D + wound vac placement on 6/1.  Back to OR for debridement and wound vac change on 6/4. - Back to OR for rectus muscle flap to cover sternal wound + wound vac on 6/10.  - Wound vac site looks good.  - Continue cefazolin IV + rifabutin until 05/24/2020 then he will be on keflex for life long suppression.  - INR 1.9 today, Discussed dosing with PharmD personally prior to d/c - Continue incentive spirometer.  2. COVID-19 infection:  Initially on airbourne precautions. ID followed closely.  Repeat COVID-19 test negative 6/5. - He is off isolation.    3. 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 LVAD parameters stable. MAPs low initially, started on phenylephrine (may have been at least partly vagal as very diaphoretic with bleeding). Speed back up to 5700 rpm. MAPs recently low after restarting home meds so home regimen has been  adjusted.  - Continue spiro 25 daily.  - Continue entresto to 49-51 mg twice a day. Lower dose than previous. He has meds for d/c    -Continue hydralazine 25 tid. Can use prn IV hydral for HTN -VAD interrogated personally. Parameters stable. 4.  Smoking:He has cut back a lot, still taking Chantix. Encouraged to quit. 5.H/oRLE DVT: on coumadin  6. OSA: Continue CPAP. 7. Hyperlipidemia: Atorvastatin. 8. Type II diabetes:SSI 9. Anemia, Acute blood loss: -  Hgb 8 today.  - Transfuse if Hgb< 7.5  - Expected surgical blood loss. Monitor closely 10. Post-op ileus. - resolved. Now off PCA 11. Hypokalemia/hypomag -Supplemented as needed throughout admission. K+ 4.1 at the time of discharge.   TOC set up the following.  Marland Kitchen  HH Arranged: RN, IV Antibiotics HH Agency: [facility] manager, [place] ([facility]) Date HH Agency Contacted: 05/02/20  LVAD Interrogation HM III:   Speed: 5700    Flow:   4.5    PI: 3,2      Power:  4     Back-up speed:   5400     Discharge Physical Exam and Vitals:  Blood pressure 115/90, pulse 82, temperature 98.7 F (37.1 C), temperature source Oral, resp. rate (!) 23, height 5' 11" (1.803 m), weight 88.4 kg, SpO2 100 %.  General: Pleasant male appearing in NAD Psych: Normal affect. Neuro: Alert and oriented X 3. Moves all extremities spontaneously. HEENT: Normal  Neck: Supple without bruits. JVP 7-8. Lungs:  Resp regular and unlabored, CTA without wheezing or rales. Heart: Mechanical heart sounds noted. Sternal vac in place.  Extremities: No clubbing, cyanosis or edema. DP/PT/Radials 2+ and equal bilaterally. PICC along RUE.    Labs: Lab Results  Component Value Date   WBC 7.1 05/06/2020   HGB 8.2 (L) 05/06/2020   HCT 26.1 (L) 05/06/2020   MCV 91.9 05/06/2020   PLT 356 05/06/2020    Recent Labs  Lab 05/06/20 0345  NA 141  K 4.1  CL 106  CO2 26  BUN 9  CREATININE 0.76  CALCIUM 9.4  GLUCOSE 104*   Lab Results  Component Value Date   CHOL 97 07/21/2018   HDL 32 (L) 07/21/2018   LDLCALC 57 07/21/2018   TRIG 39 07/21/2018   BNP (last 3 results) No results for input(s): BNP in the last 8760 hours.  ProBNP (last 3 results) No results for input(s): PROBNP  in the last 8760 hours.   Diagnostic Studies/Procedures   No results found.  Discharge Medications   Allergies as of 05/06/2020      Reactions   Chlorhexidine Rash   Other Rash   Prep pads      Medication List    STOP taking these medications   amLODipine 5 MG tablet Commonly known as: NORVASC   cephALEXin 500 MG capsule Commonly known as: KEFLEX   doxycycline 100 MG capsule Commonly known as: VIBRAMYCIN   sacubitril-valsartan 97-103 MG Commonly known as: ENTRESTO Replaced by: sacubitril-valsartan 49-51 MG     TAKE these medications   acetaminophen 500 MG tablet Commonly known as: TYLENOL Take 1,000 mg by mouth every 6 (six) hours as needed for mild pain.   aspirin EC 81 MG tablet Take 81 mg by mouth daily.   carbamide peroxide 6.5 % OTIC solution Commonly known as: DEBROX Place 5 drops into both ears 3 (three) times daily. What changed: when to take this   ceFAZolin  IVPB Commonly known as: ANCEF Inject 2 g into the vein  every 8 (eight) hours for 22 days. Indication:  LVAD driveline infection First Dose: No Last Day of Therapy:  05/24/2020 Labs - Once weekly:  CBC/D and BMP, Labs - Every other week:  ESR and CRP Method of administration: IV Push Method of administration may be changed at the discretion of home infusion pharmacist based upon assessment of the patient and/or caregiver's ability to self-administer the medication ordered.   ceFAZolin 2-4 GM/100ML-% IVPB Commonly known as: ANCEF Inject 100 mLs (2 g total) into the vein every 8 (eight) hours for 19 days.   Chantix Continuing Month Pak 1 MG tablet Generic drug: varenicline Take 1 mg by mouth daily.   docusate sodium 100 MG capsule Commonly known as: COLACE Take 2 capsules (200 mg total) by mouth daily as needed for mild constipation.   fluticasone 50 MCG/ACT nasal spray Commonly known as: FLONASE Place 1 spray into both nostrils 2 (two) times daily as needed for allergies.   freestyle  lancets Use as instructed   gabapentin 300 MG capsule Commonly known as: NEURONTIN Take 2 capsules (600 mg total) by mouth 4 (four) times daily.   glucose blood test strip Commonly known as: Contour Next Test Use as instructed   hydrALAZINE 50 MG tablet Commonly known as: APRESOLINE Take 0.5 tablets (25 mg total) by mouth 3 (three) times daily. What changed: how much to take   Ipratropium-Albuterol 20-100 MCG/ACT Aers respimat Commonly known as: COMBIVENT Inhale 1 puff into the lungs every 6 (six) hours as needed for wheezing or shortness of breath.   nystatin powder Commonly known as: MYCOSTATIN/NYSTOP Apply 1 application topically 3 (three) times daily.   oxyCODONE 5 MG immediate release tablet Commonly known as: Oxy IR/ROXICODONE Take 1-2 tablets (5-10 mg total) by mouth every 4 (four) hours as needed for moderate pain.   pantoprazole 40 MG tablet Commonly known as: PROTONIX Take 1 tablet (40 mg total) by mouth daily.   rifabutin 150 MG capsule Commonly known as: MYCOBUTIN Take 2 capsules (300 mg total) by mouth daily for 18 days.   sacubitril-valsartan 49-51 MG Commonly known as: ENTRESTO Take 1 tablet by mouth 2 (two) times daily. Replaces: sacubitril-valsartan 97-103 MG   sildenafil 100 MG tablet Commonly known as: VIAGRA Take 50 mg by mouth daily as needed for erectile dysfunction.   spironolactone 25 MG tablet Commonly known as: ALDACTONE Take 1 tablet (25 mg total) by mouth daily.   thiamine 100 MG tablet Take 1 tablet (100 mg total) by mouth daily.   traMADol 50 MG tablet Commonly known as: ULTRAM Take 2 tablets (100 mg total) by mouth every 6 (six) hours as needed for severe pain.   vitamin B-12 500 MCG tablet Commonly known as: CYANOCOBALAMIN Take 1,000 mcg by mouth daily.   warfarin 5 MG tablet Commonly known as: COUMADIN Take as directed. If you are unsure how to take this medication, talk to your nurse or doctor. Original instructions:  Take 7.5 mg (1.5 tab) MWF and 5 mg all other days or as directed by HF Clinic.   Zinc Gluconate 100 MG Tabs Take 1 tablet (100 mg total) by mouth daily. What changed: when to take this   zolpidem 5 MG tablet Commonly known as: AMBIEN TAKE 1 TABLET BY MOUTH EVERY DAY AT BEDTIME            Durable Medical Equipment  (From admission, onward)         Start     Ordered   05/02/20 1533  Heart failure home health orders  (Heart failure home health orders / Face to face)  Once       Comments: Heart Failure Follow-up Care:  Verify follow-up appointments per Patient Discharge Instructions. Confirm transportation arranged. Reconcile home medications with discharge medication list. Remove discontinued medications from use. Assist patient/caregiver to manage medications using pill box. Reinforce low sodium food selection Assessments: Vital signs and oxygen saturation at each visit. Assess home environment for safety concerns, caregiver support and availability of low-sodium foods. Consult Education officer, museum, PT/OT, Dietitian, and CNA based on assessments. Perform comprehensive cardiopulmonary assessment. Notify MD for any change in condition or weight gain of 3 pounds in one day or 5 pounds in one week with symptoms. Daily Weights and Symptom Monitoring: Ensure patient has access to scales. Teach patient/caregiver to weigh daily before breakfast and after voiding using same scale and record.    Teach patient/caregiver to track weight and symptoms and when to notify Provider. Activity: Develop individualized activity plan with patient/caregiver.  See OPAC orders for Ancef until 05/23/2020  Question Answer Comment  Heart Failure Follow-up Care [facility] at [phone]   Obtain the following labs Other see comments   Lab frequency Weekly   Fax lab results to Other see comments   Diet Low Sodium Heart Healthy   Fluid restrictions: 1800 mL Fluid      05/02/20 1533            Discharge Care Instructions  (From admission, onward)         Start     Ordered   05/06/20 0000  Change dressing on IV access line weekly and PRN  (Home infusion instructions - [facility] )        05/06/20 1324          Disposition   The patient will be discharged in stable condition to home. Discharge Instructions    (HEART FAILURE PATIENTS) Call MD:  Anytime you have any of the following symptoms: 1) 3 pound weight gain in 24 hours or 5 pounds in 1 week 2) shortness of breath, with or without a dry hacking cough 3) swelling in the hands, feet or stomach 4) if you have to sleep on extra pillows at night in order to breathe.   Complete by: As directed    [facility] pharmacist to adjust dose for Vancomycin, Aminoglycosides and other anti-infective therapies as requested by physician.   Complete by: As directed    [facility] to provide Cath Flo 27m   Complete by: As directed    Administer for PICC line occlusion and as ordered by physician for other access device issues.   Anaphylaxis Kit: Provided to treat any anaphylactic reaction to the medication being provided to the patient if First Dose or when requested by physician   Complete by: As directed    Epinephrine 126mml vial / amp: Administer 0.47m77m0.47ml47mubcutaneously once for moderate to severe anaphylaxis, nurse to call physician and pharmacy when reaction occurs and call 911 if needed for immediate care   Diphenhydramine 50mg68mIV vial: Administer 25-50mg 847mM PRN for first dose reaction, rash, itching, mild reaction, nurse to call physician and pharmacy when reaction occurs   Sodium Chloride 0.9% NS 500ml I75mdminister if needed for hypovolemic blood pressure drop or as ordered by physician after call to physician with anaphylactic reaction   Change dressing on IV access line weekly and PRN   Complete by: As directed  Diet - low sodium heart healthy   Complete by: As  directed    Flush IV access with Sodium Chloride 0.9% and Heparin 10 units/ml or 100 units/ml   Complete by: As directed    Home infusion instructions - [facility]   Complete by: As directed    Instructions: Flush IV access with Sodium Chloride 0.9% and Heparin 10units/ml or 100units/ml   Change dressing on IV access line: Weekly and PRN   Instructions Cath Flo 46m: Administer for PICC Line occlusion and as ordered by physician for other access device   [facility] pharmacist to adjust dose for: Vancomycin, Aminoglycosides and other anti-infective therapies as requested by physician   INR  Goal: 2 - 2.5   Complete by: As directed    Goal: 2 - 2.5   Increase activity slowly   Complete by: As directed    Method of administration may be changed at the discretion of home infusion pharmacist based upon assessment of the patient and/or caregiver's ability to self-administer the medication ordered   Complete by: As directed    Page VAD Coordinator at [phone] Notify for: any VAD alarms, sustained elevations of power >10 watts, sustained drop in Pulse Index <3   Complete by: As directed    Notify for:  any VAD alarms sustained elevations of power >10 watts sustained drop in Pulse Index <3     Speed Settings:   Complete by: As directed    Fixed 5700 RPM Low 5300 RPM      Follow-up Information    Dillingham, CLoel Lofty DO In 2 weeks.   Specialty: Plastic Surgery Contact information: [address] [place] [id number]   Why: for home health services Contact information: [phone]       [facility] Follow up.   Why: PCarolynn Sayerswith [facility] will be following the patient for home infusion needs.                Duration of Discharge Encounter: Total time spent 45 minutes. Over half that time spent discussing above.      Signed, BErma Heritage PA-C 05/06/2020, 1:29 PM Pager: [phone]

## 2020-05-05 NOTE — Progress Notes (Signed)
Pharmacy Antibiotic Note  Theodore Welch is a 59 y.o. male admitted on 04/16/2020 LVAD sternal wound infection. Pt has hx of recurrent MSSA infection and is now s/p 8 weeks cefazolin followed by cephalexin.  S/p exploration in OR 6/1 with wound vac change in OR 6/4. Sternal wound debridement and muscle flap done on 6/10.  Afebrile, WBC 9.4, Scr 0.69.    5/30 Bcx 1/2 MSSA  6/1 sternum: MSSA 6/3 Bcx ngF 6/4 wound: ngF  Plan: Continue cefazolin 2g IV q8h Continue rifabutin 300 mg qd Plan for listed antibiotics until July 7th, 2021 After would start cephalexin suppressive therapy - ambulatory ID appointment scheduled prior to stop date on IV antibiotic orders  Height: 5\' 11"  (180.3 cm) Weight: 89.4 kg (197 lb 1.5 oz) IBW/kg (Calculated) : 75.3  Temp (24hrs), Avg:98.3 F (36.8 C), Min:98 F (36.7 C), Max:98.9 F (37.2 C)  Recent Labs  Lab 05/01/20 0500 05/02/20 0419 05/03/20 0415 05/04/20 0332 05/05/20 0312  WBC 8.9 9.4 8.1 7.8 7.2  CREATININE 0.74 0.69 0.67 0.72 0.77    Estimated Creatinine Clearance: 103.3 mL/min (by C-G formula based on SCr of 0.77 mg/dL).    Allergies  Allergen Reactions  . Chlorhexidine Rash  . Other Rash    Prep pads    Thank you for allowing pharmacy to be a part of this patient's care.  05/07/20, Reece Leader, BCCP Clinical Pharmacist  05/05/2020 2:36 PM   [facility] pharmacy phone numbers are listed on [email or web address]

## 2020-05-05 NOTE — Progress Notes (Addendum)
8 Days Post-Op  Subjective: Doing well, Family member with patient.  No complaints. Hopeful to be d/c'ed prior to fathers day.  Objective: Vital signs in last 24 hours: Temp:  [98.1 F (36.7 C)-98.9 F (37.2 C)] 98.6 F (37 C) (06/18 1632) Pulse Rate:  [78-88] 78 (06/18 1632) Resp:  [15-29] 29 (06/18 1632) BP: (103-121)/(82-97) 121/97 (06/18 1632) SpO2:  [99 %-100 %] 100 % (06/18 1632) Weight:  [89.4 kg] 89.4 kg (06/18 0623) Last BM Date: 05/03/20  Intake/Output from previous day: 06/17 0701 - 06/18 0700 In: 2119.9 [P.O.:1200; I.V.:383.3; IV Piggyback:536.7] Out: 1407 [Urine:1375; Drains:32] Intake/Output this shift: Total I/O In: 550 [P.O.:550] Out: -   General appearance: alert, cooperative, no distress and sitting in chair Head: Normocephalic, without obvious abnormality, atraumatic Resp: unlabored Chest wall: no tenderness, Prevena wound vac in place. Good seal noted. No drainage in canister. No TTP. No erythema. Aquacell Ag dressing in place over right abdominal incision. Right abdomen JP drain in place. Serosanguinous fluid in bulb.    Lab Results:  CBC Latest Ref Rng & Units 05/05/2020 05/04/2020 05/03/2020  WBC 4.0 - 10.5 K/uL 7.2 7.8 8.1  Hemoglobin 13.0 - 17.0 g/dL 8.0(L) 8.1(L) 8.1(L)  Hematocrit 39 - 52 % 25.8(L) 25.7(L) 25.9(L)  Platelets 150 - 400 K/uL 356 180 314    BMET Recent Labs    05/04/20 0332 05/05/20 0312  NA 142 142  K 4.0 4.2  CL 108 109  CO2 23 25  GLUCOSE 107* 109*  BUN 7* 9  CREATININE 0.72 0.77  CALCIUM 9.2 9.0   PT/INR Recent Labs    05/05/20 1157 05/05/20 1530  LABPROT 17.7* 17.7*  INR 1.5* 1.5*   ABG No results for input(s): PHART, HCO3 in the last 72 hours.  Invalid input(s): PCO2, PO2  Studies/Results: No results found.  Anti-infectives: Anti-infectives (From admission, onward)   Start     Dose/Rate Route Frequency Ordered Stop   05/06/20 0000  rifabutin (MYCOBUTIN) 150 MG capsule     Discontinue     300 mg Oral  Daily 05/05/20 0957 05/24/20 2359   04/27/20 0911  vancomycin (VANCOCIN) 1,000 mg in sodium chloride 0.9 % 1,000 mL irrigation  Status:  Discontinued          As needed 04/27/20 0911 04/27/20 1230   04/27/20 0815  ceFAZolin (ANCEF) IVPB 2g/100 mL premix  Status:  Discontinued        2 g 200 mL/hr over 30 Minutes Intravenous On call to O.R. 04/27/20 0809 04/27/20 0812   04/27/20 0800  ceFAZolin (ANCEF) IVPB 2g/100 mL premix        2 g 200 mL/hr over 30 Minutes Intravenous To ShortStay Surgical 04/26/20 0827 04/27/20 0630   04/25/20 1530  rifabutin (MYCOBUTIN) capsule 300 mg     Discontinue     300 mg Oral Daily 04/25/20 1529     04/21/20 1506  vancomycin (VANCOCIN) 1,000 mg in sodium chloride 0.9 % 1,000 mL irrigation  Status:  Discontinued          As needed 04/21/20 1506 04/21/20 1649   04/21/20 1300  ceFAZolin (ANCEF) IVPB 2g/100 mL premix        2 g 200 mL/hr over 30 Minutes Intravenous To ShortStay Surgical 04/20/20 0834 04/21/20 1557   04/18/20 1400  vancomycin (VANCOREADY) IVPB 1500 mg/300 mL  Status:  Discontinued        1,500 mg 150 mL/hr over 120 Minutes Intravenous To Surgery 04/18/20 1348 04/18/20 1639  04/18/20 1127  vancomycin (VANCOCIN) 1,000 mg in sodium chloride 0.9 % 1,000 mL irrigation  Status:  Discontinued          As needed 04/18/20 1128 04/18/20 1631   04/16/20 1700  vancomycin (VANCOREADY) IVPB 1250 mg/250 mL  Status:  Discontinued        1,250 mg 166.7 mL/hr over 90 Minutes Intravenous Every 12 hours 04/16/20 0746 04/16/20 1241   04/16/20 1700  ceFAZolin (ANCEF) IVPB 2g/100 mL premix     Discontinue     2 g 200 mL/hr over 30 Minutes Intravenous Every 8 hours 04/16/20 1247     04/16/20 0600  piperacillin-tazobactam (ZOSYN) IVPB 3.375 g  Status:  Discontinued        3.375 g 12.5 mL/hr over 240 Minutes Intravenous Every 6 hours 04/16/20 0516 04/16/20 0535   04/16/20 0600  piperacillin-tazobactam (ZOSYN) IVPB 3.375 g  Status:  Discontinued        3.375 g 12.5  mL/hr over 240 Minutes Intravenous Every 8 hours 04/16/20 0536 04/16/20 1241   04/16/20 0530  vancomycin (VANCOCIN) IVPB 1000 mg/200 mL premix  Status:  Discontinued        1,000 mg 200 mL/hr over 60 Minutes Intravenous Every 12 hours 04/16/20 0516 04/16/20 0535   04/16/20 0430  vancomycin (VANCOREADY) IVPB 1750 mg/350 mL        1,750 mg 175 mL/hr over 120 Minutes Intravenous  Once 04/16/20 0429 04/16/20 0742      Assessment/Plan: s/p Procedure(s): STERNAL WOUND DEBRIDEMENT MUSCLE FLAP TO STERNUM APPLICATION OF A-CELL OF CHEST APPLICATION OF PREVENA INCISIONAL WOUND VAC  Doing well.  Wound vac in place, good seal noted. Discussed with patient and caregiver if wound vac machine dies after discharge and prior to post-op visit with plastics - Remove wound vac sponge, cover with vaseline and 4x4 gauze daily. Caregiver feels comfortable with this. They will also have [facility] who will be present intermittently throughout the week that can assist.   May be able to remove JP drain prior to d/c due to low output if still hospitalized Monday. If not, can remove post-discharge in clinic.   Discussed with patient to call our office with any questions or concerns after d/c.     LOS: 19 days    Charlies Constable, PA-C 05/05/2020

## 2020-05-05 NOTE — Plan of Care (Signed)
  Problem: Clinical Measurements: Goal: Ability to maintain clinical measurements within normal limits will improve Outcome: Progressing Goal: Will remain free from infection Outcome: Progressing Goal: Diagnostic test results will improve Outcome: Progressing Goal: Cardiovascular complication will be avoided Outcome: Progressing   Problem: Elimination: Goal: Will not experience complications related to urinary retention Outcome: Progressing   Problem: Safety: Goal: Ability to remain free from injury will improve Outcome: Progressing   Problem: Skin Integrity: Goal: Risk for impaired skin integrity will decrease Outcome: Progressing   

## 2020-05-05 NOTE — Progress Notes (Signed)
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   HM III LVAD implanted on 08/03/18 by Dr. Laneta Simmers under Destination Therapy criteria due to smoking status.  Pt up in the chair this morning, denies complaints. States he is ready to go home and is "getting a bit of cabin fever".  Dr. Maren Beach  is recommending IV Ancef for 2 weeks post discharge; Rexene Alberts, ID and Dr. Gala Romney aware.    Vital signs: Temp: 98.9 HR: 87 Doppler Pressure: 100 Auto cuff: not done O2 Sat: 99% on RA Wt: 197>189>189>203.7>...>193.5>194.4>196.6>197>204>197> lbs   LVAD interrogation reveals:  Speed: 5700 Flow: 4.4 Power: 4.4w PI: 4.2  Alarms: none Events: 3-5 PI daily Hematocrit: 26  Fixed speed: 5700 Low speed limit: 5400  Drive Line: Daily dressing change per bedside RN.  Clean with betadine, no skin prep (skin intolerance). Take down outer portion of dressing. Leave Mepitel dressing in place. Cover with sterile gel and saline moistened 4 x 4 and sterile VAD dressing. RN to perform today. Continue daily dressing changes.    Labs:  LDH trend: 182>194>140>252>...[phone]  INR trend: 2.5>1.9>1.5>1.4>1.3>1.2>1.1>1.0>1.0>1.0>1.0>1.2>1.1>1.3>1.6  Anticoagulation Plan: -INR Goal: 2.0 - 2.5  -ASA Dose: 81 mg daily   Blood Products:  - 04/16/20>FFP x 2; PRBC x 1 - 04/17/20>>FFP x 1; PRBC x 1 - 04/27/20>>FFP x 2 - 04/29/20>>PRBC x 1  Intra-op: - 04/18/20>>PRBC x 6; Plts x 2; FFP x 2; Cell saver 2330 ml  Device: N/A  Respiratory: intubated intra op 04/18/20 - extubated 04/19/20 am   Infection:  - 04/16/20>>Covid positive - 04/16/20>>BCs staph aureus - 04/18/20>>sternal wound culture intra-op>>staph aureus - 04/20/20>>BCs negative  - 04/22/20>> COVID negative  Gtts: - Heparin 1200 units/hr   Plan/Recommendations:  1. Call VAD Coordinator if any VAD equipment or drive line issues. 2. Daily drive line dressing change  per bedside RN   [name] RN VAD Coordinator  Office: [phone]  24/7 Pager: [phone]

## 2020-05-05 NOTE — TOC Transition Note (Signed)
Transition of Care [facility]) - CM/SW Discharge Note   Patient Details  Name: Theodore Welch MRN: [id number] Date of Birth: 1961/05/27  Transition of Care [place]) CM/SW Contact:  Janae Bridgeman, RN Phone Number: 05/05/2020, 11:30 AM   Clinical Narrative:    I spoke with Jeri Modena, RNCM with [facility] ([facility]) will be meeting with the patient today concerning discharge infusion needs.  The VA is pending for insurance authorization and patient is discharging today or tomorrow based on laboratory results.  [facility] will be following for discharge needs for home health.  Will continue to follow for discharge needs.     Barriers to Discharge: Continued Medical Work up   Patient Goals and CMS Choice Patient states their goals for this hospitalization and ongoing recovery are:: to go home CMS Medicare.gov Compare Post Acute Care list provided to:: Patient Choice offered to / list presented to : Patient  Discharge Placement                       Discharge Plan and Services   Discharge Planning Services: CM Consult Post Acute Care Choice: Home Health, Durable Medical Equipment                    HH Arranged: RN, IV Antibiotics HH Agency: Surveyor, mining, [facility] ([facility]) Date [facility] Agency Contacted: 05/02/20 Time HH Agency Contacted: 1549 Representative spoke with at [facility] Agency: Elita Quick and Lupita Leash  Social Determinants of Health (SDOH) Interventions     Readmission Risk Interventions Readmission Risk Prevention Plan 05/05/2020 04/25/2020 02/14/2020  Transportation Screening Complete Complete Complete  PCP or Specialist Appt within 3-5 Days - - -  Not Complete comments - - -  HRI or Home Care Consult - Complete -  Social Work Consult for Recovery Care Planning/Counseling - Complete -  Palliative Care Screening - Not Applicable -  Medication Review [facility]) Complete Complete -  PCP or Specialist appointment within 3-5 days of  discharge Complete - -  HRI or Home Care Consult Complete - -  SW Recovery Care/Counseling Consult Complete - -  Palliative Care Screening Complete - -  Skilled Nursing Facility Complete - -  Some recent data might be hidden

## 2020-05-06 LAB — CBC
HCT: 26.1 % — ABNORMAL LOW (ref 39.0–52.0)
Hemoglobin: 8.2 g/dL — ABNORMAL LOW (ref 13.0–17.0)
MCH: 28.9 pg (ref 26.0–34.0)
MCHC: 31.4 g/dL (ref 30.0–36.0)
MCV: 91.9 fL (ref 80.0–100.0)
Platelets: 356 10*3/uL (ref 150–400)
RBC: 2.84 MIL/uL — ABNORMAL LOW (ref 4.22–5.81)
RDW: 13.1 % (ref 11.5–15.5)
WBC: 7.1 10*3/uL (ref 4.0–10.5)
nRBC: 0 % (ref 0.0–0.2)

## 2020-05-06 LAB — BASIC METABOLIC PANEL
Anion gap: 9 (ref 5–15)
BUN: 9 mg/dL (ref 8–23)
CO2: 26 mmol/L (ref 22–32)
Calcium: 9.4 mg/dL (ref 8.9–10.3)
Chloride: 106 mmol/L (ref 98–111)
Creatinine, Ser: 0.76 mg/dL (ref 0.61–1.24)
GFR calc Af Amer: >60 mL/min (ref >60)
GFR calc non Af Amer: >60 mL/min (ref >60)
Glucose, Bld: 104 mg/dL — ABNORMAL HIGH (ref 70–99)
Potassium: 4.1 mmol/L (ref 3.5–5.1)
Sodium: 141 mmol/L (ref 135–145)

## 2020-05-06 LAB — LACTATE DEHYDROGENASE: LDH: 180 U/L (ref 98–192)

## 2020-05-06 LAB — HEPARIN LEVEL (UNFRACTIONATED): Heparin Unfractionated: 0.1 IU/mL — ABNORMAL LOW (ref 0.30–0.70)

## 2020-05-06 LAB — PROTIME-INR
INR: 1.9 — ABNORMAL HIGH (ref 0.8–1.2)
Prothrombin Time: 20.7 seconds — ABNORMAL HIGH (ref 11.4–15.2)

## 2020-05-06 MED ORDER — CEFAZOLIN SODIUM-DEXTROSE 2-4 GM/100ML-% IV SOLN: 2.0000 g | Freq: Three times a day (TID) | INTRAVENOUS | 30 refills | Status: DC

## 2020-05-06 MED ORDER — HYDRALAZINE HCL 50 MG PO TABS: 25.0000 mg | ORAL_TABLET | Freq: Three times a day (TID) | ORAL | 5 refills | Status: DC

## 2020-05-06 MED ORDER — CEFAZOLIN IV (FOR PTA / DISCHARGE USE ONLY): 2.0000 g | Freq: Three times a day (TID) | INTRAVENOUS | 0 refills | Status: DC

## 2020-05-06 NOTE — Progress Notes (Signed)
[place] for heparin - per TCTS dosing, warfarin Indication: LVAD  Allergies  Allergen Reactions  . Chlorhexidine Rash  . Other Rash    Prep pads    Patient Measurements: Height: 5\' 11"  (180.3 cm) Weight: 88.4 kg (194 lb 14.2 oz) IBW/kg (Calculated) : 75.3 Heparin Dosing Weight: ~ 90 kg  Vital Signs: Temp: 98.5 F (36.9 C) (06/19 0335) Temp Source: Oral (06/19 0335) BP: 103/79 (06/19 0335) Pulse Rate: 77 (06/19 0335)  Labs: Recent Labs    05/04/20 0332 05/04/20 0332 05/04/20 1933 05/05/20 0312 05/05/20 0312 05/05/20 1157 05/05/20 1530 05/06/20 0345  HGB 8.1*   < >  --  8.0*  --   --   --  8.2*  HCT 25.7*  --   --  25.8*  --   --   --  26.1*  PLT 180  --   --  356  --   --   --  356  LABPROT 16.1*   < >  --  18.5*   < > 17.7* 17.7* 20.7*  INR 1.3*   < >  --  1.6*   < > 1.5* 1.5* 1.9*  HEPARINUNFRC <0.10*   < > <0.10* <0.10*  --   --   --  0.10*  CREATININE 0.72  --   --  0.77  --   --   --  0.76   < > = values in this interval not displayed.    Estimated Creatinine Clearance: 103.3 mL/min (by C-G formula based on SCr of 0.76 mg/dL).   Medical History: Past Medical History:  Diagnosis Date  . 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 Cardiomyopathy, unspecified ([place])   . CHF (congestive heart failure) ([place])   . Chronic back pain   . Diabetes mellitus without complication ([place])   . Enlarged heart   . Gastric ulcer   . Gastroenteritis   . H/O degenerative disc disease   . Hypertension   . LVAD (left ventricular assist device) present (H[facility])     Medications:   Infusions:  . sodium chloride    . sodium chloride    . sodium chloride Stopped (05/05/20 0847)  .  ceFAZolin (ANCEF) IV 2 g (05/06/20 0626)    Assessment: 59 yo male with LVAD - HM3 + ASA 81mg  daily, MSSA bacteremia and  driveline infection.  Chronic Coumadin has been on hold for procedures - flap 6/10.    PTA dose of warfarin: 7.5 mg MWF, 5 mg all other  days   Underwent sternal wound debridement with flap and wound VAC application on 9/48. Warfarin restarted on 6/11 per MD.  INR up to 1.9 this morning, heparin off. Hgb stable at 8.2. LDH 180.   Patient appears to have been very stable on outpatient dose of warfarin, with high dose given last night (10mg ) expect INR to trend up over weekend. Will plan on restarting home dose of warfarin at discharge today with 5mg  through the weekend and starting previous regimen of 7.5mg  on MWF and 5mg  all other days next week. Will pass along information to HF clinic pharmacist, patient has appointment in HF clinic for dressing change/INR next Wednesday 6/23  Goal of Therapy:  INR: 2-2.5  Monitor platelets by anticoagulation protocol: Yes   Plan:  Heparin off Recommend to restart previous dose of warfarin at discharge Warfarin 5mg  daily except 7.5mg  on MWF  INR check 6/23  Erin Hearing PharmD., BCPS Clinical Pharmacist 05/06/2020  7:24 AM  [facility] pharmacy phone numbers are listed on [email or web address]

## 2020-05-06 NOTE — TOC Transition Note (Signed)
Transition of Care [facility]) - CM/SW Discharge Note   Patient Details  Name: Theodore Welch MRN: [id number] Date of Birth: [birth year]  Transition [facility]) CM/SW Contact:  Lawerance Sabal, RN Phone Number: 05/06/2020, 12:37 PM   Clinical Narrative:    Raiford Simmonds of [facility], meds were being delivered last night to the home in anticipation of DC today. Notified [facility] that patient will DC today.       Barriers to Discharge: Continued Medical Work up   Patient Goals and CMS Choice Patient states their goals for this hospitalization and ongoing recovery are:: to go home CMS Medicare.gov Compare Post Acute Care list provided to:: Patient Choice offered to / list presented to : Patient  Discharge Placement                       Discharge Plan and Services   Discharge Planning Services: CM Consult Post Acute Care Choice: Home Health, Durable Medical Equipment                    HH Arranged: RN, IV Antibiotics HH Agency: Surveyor, mining, [facility] Date [facility] Agency Contacted: 05/02/20 Time HH Agency Contacted: [phone] Representative spoke with at [facility] Agency: Elita Quick and Lupita Leash  Social Determinants of Health (SDOH) Interventions     Readmission Risk Interventions Readmission Risk Prevention Plan 05/05/2020 04/25/2020 02/14/2020  Transportation Screening Complete Complete Complete  PCP or Specialist Appt within 3-5 Days - - -  Not Complete comments - - -  HRI or Home Care Consult - Complete -  Social Work Consult for Recovery Care Planning/Counseling - Complete -  Palliative Care Screening - Not Applicable -  Medication Review Oceanographer) Complete Complete -  PCP or Specialist appointment within 3-5 days of discharge Complete - -  HRI or Home Care Consult Complete - -  SW Recovery Care/Counseling Consult Complete - -  Palliative Care Screening Complete - -  Skilled Nursing Facility Complete - -  Some recent data might be hidden

## 2020-05-09 ENCOUNTER — Other Ambulatory Visit (HOSPITAL_COMMUNITY): Payer: Self-pay | Admitting: Unknown Physician Specialty

## 2020-05-09 DIAGNOSIS — T827XXA Infection and inflammatory reaction due to other cardiac and vascular devices, implants and grafts, initial encounter: Secondary | ICD-10-CM

## 2020-05-09 DIAGNOSIS — Z7901 Long term (current) use of anticoagulants: Secondary | ICD-10-CM

## 2020-05-10 ENCOUNTER — Ambulatory Visit (HOSPITAL_COMMUNITY): Payer: Self-pay | Admitting: Pharmacist

## 2020-05-10 ENCOUNTER — Other Ambulatory Visit: Payer: Self-pay

## 2020-05-10 ENCOUNTER — Encounter (HOSPITAL_COMMUNITY): Payer: Self-pay

## 2020-05-10 ENCOUNTER — Ambulatory Visit (HOSPITAL_COMMUNITY)
Admit: 2020-05-10 | Discharge: 2020-05-10 | Disposition: A | Discharge status: Home / Self Care | Payer: Medicare HMO | Source: Ambulatory Visit | Attending: Internal Medicine | Admitting: Internal Medicine

## 2020-05-10 DIAGNOSIS — I428 Other cardiomyopathies: Secondary | ICD-10-CM | POA: Insufficient documentation

## 2020-05-10 DIAGNOSIS — E785 Hyperlipidemia, unspecified: Secondary | ICD-10-CM | POA: Insufficient documentation

## 2020-05-10 DIAGNOSIS — F17200 Nicotine dependence, unspecified, uncomplicated: Secondary | ICD-10-CM | POA: Diagnosis not present

## 2020-05-10 DIAGNOSIS — K7460 Unspecified cirrhosis of liver: Secondary | ICD-10-CM | POA: Insufficient documentation

## 2020-05-10 DIAGNOSIS — Z86718 Personal history of other venous thrombosis and embolism: Secondary | ICD-10-CM | POA: Diagnosis not present

## 2020-05-10 DIAGNOSIS — E119 Type 2 diabetes mellitus without complications: Secondary | ICD-10-CM | POA: Diagnosis not present

## 2020-05-10 DIAGNOSIS — I5022 Chronic systolic (congestive) heart failure: Secondary | ICD-10-CM | POA: Diagnosis not present

## 2020-05-10 DIAGNOSIS — Z95811 Presence of heart assist device: Secondary | ICD-10-CM | POA: Diagnosis not present

## 2020-05-10 DIAGNOSIS — K219 Gastro-esophageal reflux disease without esophagitis: Secondary | ICD-10-CM | POA: Diagnosis not present

## 2020-05-10 DIAGNOSIS — Z7982 Long term (current) use of aspirin: Secondary | ICD-10-CM | POA: Diagnosis not present

## 2020-05-10 DIAGNOSIS — Z7901 Long term (current) use of anticoagulants: Secondary | ICD-10-CM | POA: Diagnosis not present

## 2020-05-10 DIAGNOSIS — T827XXA Infection and inflammatory reaction due to other cardiac and vascular devices, implants and grafts, initial encounter: Secondary | ICD-10-CM | POA: Diagnosis not present

## 2020-05-10 DIAGNOSIS — Z79899 Other long term (current) drug therapy: Secondary | ICD-10-CM | POA: Diagnosis not present

## 2020-05-10 DIAGNOSIS — G4733 Obstructive sleep apnea (adult) (pediatric): Secondary | ICD-10-CM | POA: Diagnosis not present

## 2020-05-10 LAB — CBC
HCT: 32.9 % — ABNORMAL LOW (ref 39.0–52.0)
Hemoglobin: 10 g/dL — ABNORMAL LOW (ref 13.0–17.0)
MCH: 28.2 pg (ref 26.0–34.0)
MCHC: 30.4 g/dL (ref 30.0–36.0)
MCV: 92.9 fL (ref 80.0–100.0)
Platelets: 418 10*3/uL — ABNORMAL HIGH (ref 150–400)
RBC: 3.54 MIL/uL — ABNORMAL LOW (ref 4.22–5.81)
RDW: 13.3 % (ref 11.5–15.5)
WBC: 11.2 10*3/uL — ABNORMAL HIGH (ref 4.0–10.5)
nRBC: 0 % (ref 0.0–0.2)

## 2020-05-10 LAB — BASIC METABOLIC PANEL
Anion gap: 11 (ref 5–15)
BUN: 9 mg/dL (ref 8–23)
CO2: 22 mmol/L (ref 22–32)
Calcium: 9.7 mg/dL (ref 8.9–10.3)
Chloride: 105 mmol/L (ref 98–111)
Creatinine, Ser: 0.81 mg/dL (ref 0.61–1.24)
GFR calc Af Amer: >60 mL/min (ref >60)
GFR calc non Af Amer: >60 mL/min (ref >60)
Glucose, Bld: 92 mg/dL (ref 70–99)
Potassium: 4.6 mmol/L (ref 3.5–5.1)
Sodium: 138 mmol/L (ref 135–145)

## 2020-05-10 LAB — PROTIME-INR
INR: 1.7 — ABNORMAL HIGH (ref 0.8–1.2)
Prothrombin Time: 19.6 seconds — ABNORMAL HIGH (ref 11.4–15.2)

## 2020-05-10 LAB — LACTATE DEHYDROGENASE: LDH: 253 U/L — ABNORMAL HIGH (ref 98–192)

## 2020-05-10 NOTE — Addendum Note (Signed)
Encounter addended by: Laurey Morale, MD on: 05/10/2020 10:27 PM  Actions taken: Level of Service modified

## 2020-05-10 NOTE — Patient Instructions (Signed)
1. No medication changes 2. Coumadin dosing per [name] PharmD- 12.5 mg (2.5 tablets) tonight, then continue current regimen (7.5 mg Monday, Wednesday, Friday 5 mg all other days) 3. Return on Friday for dressing change and wound vac removal with Dr Zenaida Niece Trigt 4. Return for full follow up visit in 2 weeks

## 2020-05-10 NOTE — Progress Notes (Signed)
LVAD INR 

## 2020-05-10 NOTE — Progress Notes (Addendum)
Patient presents for hospital f/u in [facility] today with his wife. Reports no problems with VAD equipment or concerns with drive line. Denies any falls, dizziness, lightheadedness, or syncope.   Pt was hospitalized 5/30-6/18 for sternal wound and driveline debridement. Pt discharged on Ancef and Rifabutin until 05/24/20.   Has sternal Prevena wound vac in place. Plan to remove in clinic on Friday with Dr Prescott Gum. Scant drainage in chamber. JP drain with 10 - 25 cc dark bloody drainage daily.   Abdominal incision with dressing in place. Dressing removed using sterile technique. Site cleansed with betadine swab. JP site cleansed with betadine swab. Covered abdominal incision with allevyn border. JP site covered with 2 x 2. Provided with extra allevyn border is dressing comes off prior to appointment Friday.    Vital Signs:  Temp:   Doppler Pressure: 74 Automatc BP: 127/51 (81) HR: 87 SPO2: 99% RA  Weight: 194.2 lb w/o eqt Last weight: 194 lb at discharge  VAD Indication: Destination Therapy due to smoking status  VAD interrogation & Equipment Management: Speed: 5700 Flow:  5.1 Power:  4.4w    PI: 2.9 Hct: 26  Alarms: no clinical alarms Events: 50 - 60+ PI events daily Fixed speed: 5700 Low speed limit: 5400  Primary Controller:  Replace back up battery in 22 months. Back up controller:   Replace back up battery in 26 months.  Annual Equipment Maintenance on UBC/PM was performed on 07/2019.   Back up battery charged in back up controller during clinic visit.   I reviewed the LVAD parameters from today and compared the results to the patient's prior recorded data. LVAD interrogation was NEGATIVE for significant power changes, NEGATIVE for clinical alarms and NEGATIVE for PI events/speed drops. No programming changes were made and pump is functioning within specified parameters. Pt is performing daily controller and system monitor self tests along with completing weekly and  monthly maintenance for LVAD equipment.  LVAD equipment check completed and is in good working order. Back-up equipment present.    Exit Site Care: Drive line is being maintained daily by [employer]. Gauze dressing with anchor intact and accurately applied.  Existing dressing removed using sterile technique. Site cleansed with betadine swab x 2 and allowed to dry. Cellerate applied to wound bed. Topped with mepitel, sterile gel, saline moistened 2 x 2, and sterile 4 x 4s. Acell sheet seen in wound bed. Clear suture noted on lateral aspect of wound bed. Small amount of thick, clear/yellow drainage noted. No foul odor or tenderness. Some redness noted around site with "bumpy" rash. Exit site with minimal tissue ingrown. Anchor intact. Continue daily dressing changes.      Device: N/A    BP & Labs:  Doppler 74 - Doppler is reflecting MAP   Hgb 10.0 - No S/S of bleeding. Specifically denies melena/BRBPR or nosebleeds.   LDH stable at 253 with established baseline of 150 - 220. Denies tea-colored urine. No power elevations noted on interrogation.    Patient Instructions: 1. No medication changes 2. Coumadin dosing per Ander Purpura PharmD- 12.5 mg (2.5 tablets) tonight, then continue current regimen (7.5 mg Monday, Wednesday, Friday 5 mg all other days) 3. Return on Friday for dressing change and wound vac removal with Dr Lucianne Lei Trigt 4. Return for full follow up visit in 2 weeks   Emerson Monte RN [place] Coordinator  Office: [phone]  24/7 Pager: [phone]    59 y.o. with history of nonischemic cardiomyopathy, RLE DVT, cirrhosis, smoking, and OSA  returns for followup of CHF/LVAD placement.  Cardiomyopathy was diagnosed in 6/19 in [place], [place] at that time showed low output.  He was admitted to [facility] in 9/19 with low output HF and was started on milrinone and diuresed.  Unable to wean off milrinone.  He had a degree of RV failure, but this improved on milrinone.  Valvular heart disease also  looked better with milrinone and diuresis.  On 08/03/18, he had Heartmate 3 LVAD placed.  Speed was optimized by ramp echo post-op.  Post-op course was relatively unremarkable.  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  No abscess on CT.  He was started on cefazolin IV for 6 wks.  BP-active meds decreased with low MAP.   He was admitted in 3/21 with subxiphoid abscess and cellulitis.  CT showed collection along the course of the driveline.  There was no evidence for sternal osteomyelitis.  Abscess was debrided, MSSA grew from wound cultures.  Wound vac was placed.  Patient was started on cefazolin, which was recently stopped after completing 8 wks.   Patient was re-admitted later in 3/21 with facial Zoster.  He was treated with acyclovir and this has resolved.   He was admitted in 6/21 with bleeding from the site of the subxiphoid abscess as well as cellulitis.  He required multiple units of PRBCs.  He went to the OR initially for I&D.  Blood and wound cultures grew MSSA.  He then went back to the OR for rectus flap over wound site (plastics).  He was started on cefazolin and rifabutin, has PICC.   He returns today for followup of LVAD.  Wound site is healing.  Weight is stable.  MAP 81.  He denies dyspnea or orthopnea.  No lightheadedness.    Denies LVAD alarms.  Reports taking Coumadin as prescribed and adherence to anticoagulation based dietary restrictions.  Denies bright red blood per rectum or melena, no dark urine or hematuria.    Labs (9/19): LDH 210, INR 2.17, WBCs 17.1 => 15, hgb 9.7, creatinine 0.72 Labs (10/19): K 4.3, creatinine 0.78 => 0.83, hgb 10.2 Labs (11/19): creatinine 0.79, hgb 10 Labs (2/20): K 3.9, creatinine 0.79 Labs (4/20): K 4, creatinine 1.15, hgb 11.4, LDH 199, INR 1.9 Labs (5/20): hgb 10.4, WBCs 8.5 Labs (6/20): K 4.2, creatinine 1.06, hgb 11.2 Labs (8/20): k 3.7, creatinine 1.0,  hgb 11.5, LDH 186 Labs (10/20): K 3.5, creatinine 0.93, INR 2, LDH 221 Labs (12/20): hgb 15.6, K 3.7, creatinine 1.02, LDH 250 Labs (4/21): K 4.1, creatinine 0.86, hgb 11.1 Labs (6/21): K 4.1, creatinine 0.76, hgb 10, WBCs 11.2  PMH: 1. Degenerative disc disease.  2. GERD 3. Chronic systolic CHF:  Nonischemic cardiomyopathy.  Dilated cardiomyopathy diagnosed 6/19 in [place].  LHC/RHC in 7/19 showed elevated filling pressures, low cardiac output, and no significant CAD.   - Echo (9/19): Severe LV dilation with EF 10-20%, moderate-severe MR, severely dilated RV with mildly decreased systolic function, severe TR.  - Cardiac MRI (9/19): EF 14%, moderate dilated LV, severely dilated RV with mod-severe systolic function and EF 49%, nonspecific RV insertion site LGE.  - RHC (5/19) on milrinone 0.375: mean RA 8, PA 40/19, mean PCWP 12, PAPi 2.65, CI 2.71.  - Echo (9/19) on milrinone and diuresed): EF 20-25% with moderate LV dilation, moderately dilated RV with mildly decreased systolic function, mild-moderate MR, moderate TR, PASP 51 mmHg.  Cannot rule  out noncompaction.  - Heartmate 3 LVAD placement in 9/19.  4. OSA: CPAP use.  5. Prior smoker 6. Type 2 diabetes 7. Hyperlipidemia.  8. H/o NSVT 9. 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  10. RLE DVT: found in 9/19.  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 Subxiphoid abscess involving collection along driveline in 4/65, MSSA.  6/21 Rectus flap to cover abscess site.  12. Facial Zoster 3/21  Current Outpatient Medications  Medication Sig Dispense Refill  . acetaminophen (TYLENOL) 500 MG tablet Take 1,000 mg by mouth every 6 (six) hours as needed for mild pain.    Marland Kitchen aspirin EC 81 MG tablet Take 81 mg by mouth daily.    . carbamide peroxide (DEBROX) 6.5 % OTIC solution Place 5 drops into both ears 3 (three) times daily. (Patient taking differently: Place 5 drops into both ears every other  day. ) 15 mL 0  . ceFAZolin (ANCEF) 2-4 GM/100ML-% IVPB Inject 100 mLs (2 g total) into the vein every 8 (eight) hours for 19 days. 1 each 30  . ceFAZolin (ANCEF) IVPB Inject 2 g into the vein every 8 (eight) hours for 22 days. Indication:  LVAD driveline infection First Dose: No Last Day of Therapy:  05/24/2020 Labs - Once weekly:  CBC/D and BMP, Labs - Every other week:  ESR and CRP Method of administration: IV Push Method of administration may be changed at the discretion of home infusion pharmacist based upon assessment of the patient and/or caregiver's ability to self-administer the medication ordered. 66 Units 0  . fluticasone (FLONASE) 50 MCG/ACT nasal spray Place 1 spray into both nostrils 2 (two) times daily as needed for allergies.     . hydrALAZINE (APRESOLINE) 50 MG tablet Take 0.5 tablets (25 mg total) by mouth 3 (three) times daily. (Patient taking differently: Take 50 mg by mouth 3 (three) times daily. ) 90 tablet 5  . Ipratropium-Albuterol (COMBIVENT) 20-100 MCG/ACT AERS respimat Inhale 1 puff into the lungs every 6 (six) hours as needed for wheezing or shortness of breath.     . oxyCODONE (OXY IR/ROXICODONE) 5 MG immediate release tablet Take 1-2 tablets (5-10 mg total) by mouth every 4 (four) hours as needed for moderate pain. 30 tablet 0  . pantoprazole (PROTONIX) 40 MG tablet Take 1 tablet (40 mg total) by mouth daily. 30 tablet 2  . rifabutin (MYCOBUTIN) 150 MG capsule Take 2 capsules (300 mg total) by mouth daily for 18 days. 36 capsule 0  . sacubitril-valsartan (ENTRESTO) 49-51 MG Take 1 tablet by mouth 2 (two) times daily. 60 tablet 0  . sildenafil (VIAGRA) 100 MG tablet Take 50 mg by mouth daily as needed for erectile dysfunction.    Marland Kitchen spironolactone (ALDACTONE) 25 MG tablet Take 1 tablet (25 mg total) by mouth daily. 30 tablet 5  . thiamine 100 MG tablet Take 1 tablet (100 mg total) by mouth daily. 30 tablet 0  . varenicline (CHANTIX CONTINUING MONTH PAK) 1 MG tablet Take 1  mg by mouth daily.     Marland Kitchen warfarin (COUMADIN) 5 MG tablet Take 7.5 mg (1.5 tab) MWF and 5 mg all other days or as directed by HF Clinic. 100 tablet 3  . Zinc Gluconate 100 MG TABS Take 1 tablet (100 mg total) by mouth daily. (Patient taking differently: Take 100 mg by mouth 2 (two) times a day. ) 90 tablet 3  . zolpidem (AMBIEN) 5 MG tablet TAKE 1 TABLET BY MOUTH EVERY  DAY AT BEDTIME (Patient taking differently: Take 5 mg by mouth at bedtime. ) 30 tablet 2  . cyanocobalamin 500 MCG tablet Take 1,000 mcg by mouth daily.  (Patient not taking: Reported on 05/10/2020)    . docusate sodium (COLACE) 100 MG capsule Take 2 capsules (200 mg total) by mouth daily as needed for mild constipation. (Patient not taking: Reported on 05/10/2020) 10 capsule 0  . gabapentin (NEURONTIN) 300 MG capsule Take 2 capsules (600 mg total) by mouth 4 (four) times daily. 240 capsule 6  . glucose blood (CONTOUR NEXT TEST) test strip Use as instructed (Patient not taking: Reported on 05/10/2020) 100 each 12  . Lancets (FREESTYLE) lancets Use as instructed (Patient not taking: Reported on 05/10/2020) 100 each 12  . nystatin (MYCOSTATIN/NYSTOP) powder Apply 1 application topically 3 (three) times daily. (Patient not taking: Reported on 05/10/2020) 15 g 3  . traMADol (ULTRAM) 50 MG tablet Take 2 tablets (100 mg total) by mouth every 6 (six) hours as needed for severe pain. (Patient not taking: Reported on 03/28/2020) 20 tablet 0   No current facility-administered medications for this encounter.    Chlorhexidine and Other  REVIEW OF SYSTEMS: All systems negative except as listed in HPI, PMH and Problem list.   LVAD INTERROGATION:  Please see LVAD nurse's note above for details.    I reviewed the LVAD parameters from today, and compared the results to the patient's prior recorded data.  No programming changes were made.  The LVAD is functioning within specified parameters.  The patient performs LVAD self-test daily.  LVAD  interrogation was negative for any significant power changes, alarms or PI events/speed drops.  LVAD equipment check completed and is in good working order.  Back-up equipment present.   LVAD education done on emergency procedures and precautions and reviewed exit site care.   MAP 81  Physical Exam: General: Well appearing this am. NAD.  HEENT: Normal. Neck: Supple, JVP 7-8 cm. Carotids OK.  Cardiac:  Mechanical heart sounds with LVAD hum present.  Lungs:  CTAB, normal effort.  Abdomen:  NT, ND, no HSM. No bruits or masses. +BS  LVAD exit site: Dressings intact, see pictures above.  Extremities:  Warm and dry. No cyanosis, clubbing, rash, or edema.  Neuro:  Alert & oriented x 3. Cranial nerves grossly intact. Moves all 4 extremities w/o difficulty. Affect pleasant      ASSESSMENT AND PLAN:   1. 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  LVAD parameters stable but still with frequent PIs, no suction.  Frequent PIs may be related to systemic hypertension, seem to be improving now with lower MAP.  NYHA class I-II.   He does not look volume overloaded. MAP controlled.   - He does not need Lasix.  Encouraged hydration.    - Continue spironolactone 25 mg daily.  - Continue Entresto 49/51 bid.  - Continue hydralazine 50 mg tid.  - Continue ASA 81 daily.  - Continue warfarin for INR 2-2.5.  No evidence for GI bleeding. Hgb stable.   - Should be transplant candidate eventually if he can quit smoking.  Will make transplant clinic referral when he is off totally.  2. Smoking: He has cut back a lot, still taking Chantix. Encouraged to quit.  3. RLE DVT: On warfarin for LVAD.  4. OSA: Continue CPAP.  5. Hyperlipidemia: Atorvastatin.  6. Type II diabetes: Per PCP.  7. 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   - Continue cefazolin via PICC and rifabutin per ID.  After these antibiotics are  completed, he will have long-term Keflex and doxycycline for MSSA suppression.  - Followup Friday to get wound vac removed.  - Followup with plastics scheduled.    Loralie Champagne 05/10/2020

## 2020-05-12 ENCOUNTER — Other Ambulatory Visit (HOSPITAL_COMMUNITY): Payer: Self-pay | Admitting: *Deleted

## 2020-05-12 ENCOUNTER — Ambulatory Visit (HOSPITAL_COMMUNITY)
Admission: RE | Admit: 2020-05-12 | Discharge: 2020-05-12 | Disposition: A | Discharge status: Home / Self Care | Payer: Medicare HMO | Source: Ambulatory Visit | Attending: Cardiology | Admitting: Cardiology

## 2020-05-12 ENCOUNTER — Other Ambulatory Visit: Payer: Self-pay

## 2020-05-12 DIAGNOSIS — T827XXA Infection and inflammatory reaction due to other cardiac and vascular devices, implants and grafts, initial encounter: Secondary | ICD-10-CM

## 2020-05-12 DIAGNOSIS — Z95811 Presence of heart assist device: Secondary | ICD-10-CM

## 2020-05-12 NOTE — Progress Notes (Signed)
Patient presents for wound vac change in [facility] today with his wife. Reports no problems with VAD equipment or concerns with drive line. Denies any falls, dizziness, lightheadedness, or syncope.   Pt was hospitalized 5/30-6/18 for sternal wound and driveline debridement. Pt discharged on Ancef and Rifabutin until 05/24/20.   Sternal incision care: Has sternal Prevena wound vac in place. Scant drainage in chamber. Removed. Cleaned with betadine by Dr. Prescott Gum. Mepilex dressing applied.    Abdominal incision care: Abdominal incision with dressing in place. Dressing removed using sterile technique. Site cleansed with betadine swab. JP site cleansed with betadine swab. Covered abdominal incision with mepilex border. JP site covered with 2 x 2.    Exit Site Care: Drive line is being maintained daily by [employer]. Gauze dressing with anchor intact and accurately applied.  Existing dressing removed using sterile technique. Site cleansed with betadine swab x 2 and allowed to dry. Acell sheet removed and wound bed debrided per Dr. Prescott Gum. Cellerate applied to wound bed. Topped with mepitel, and saline moistened 2 x 2, and sterile 4 x 4s. Small amount of thick, clear/yellow drainage noted. No foul odor or tenderness. Some redness noted around site with "bumpy" rash. Exit site with minimal tissue ingrown. Anchor changed. Continue daily dressing changes using saline moinstened 2 x 2. Covered with driveline dressing kit sponge.      Patient Instructions: 1. Return on Tuesday for dressing change and labs  Tanda Rockers RN [place] Coordinator  Office: [phone]  24/7 Pager: [phone]

## 2020-05-15 ENCOUNTER — Ambulatory Visit: Payer: No Typology Code available for payment source | Admitting: Infectious Diseases

## 2020-05-15 ENCOUNTER — Other Ambulatory Visit: Payer: Self-pay

## 2020-05-15 ENCOUNTER — Ambulatory Visit (INDEPENDENT_AMBULATORY_CARE_PROVIDER_SITE_OTHER): Payer: Medicare HMO | Admitting: Plastic Surgery

## 2020-05-15 ENCOUNTER — Encounter: Payer: Self-pay | Admitting: Plastic Surgery

## 2020-05-15 VITALS — BP 115/78 | HR 107 | Temp 97.1°F | Ht 70.0 in | Wt 194.0 lb

## 2020-05-15 DIAGNOSIS — E119 Type 2 diabetes mellitus without complications: Secondary | ICD-10-CM

## 2020-05-15 DIAGNOSIS — T8130XA Disruption of wound, unspecified, initial encounter: Secondary | ICD-10-CM | POA: Insufficient documentation

## 2020-05-15 DIAGNOSIS — T8132XA Disruption of internal operation (surgical) wound, not elsewhere classified, initial encounter: Secondary | ICD-10-CM

## 2020-05-15 DIAGNOSIS — I5022 Chronic systolic (congestive) heart failure: Secondary | ICD-10-CM

## 2020-05-15 NOTE — Progress Notes (Signed)
   Subjective:    Patient ID: Theodore Welch, male    DOB: 19-Dec-1960, 59 y.o.   MRN: [id number]  The patient is a 59-year black male here for follow-up after a vertical rectus flap 2 weeks ago.  He is now at home and is doing extremely well.  His pain is well controlled.  He has not had any issues with the drain.  Drain output has been extremely minimal.  Went ahead and took the drain out today.  I changed the abdominal dressing and placed a Mepilex border.  This seems to be doing very well for him and not irritating his skin.  There is no sign of fluid retention infection.     Review of Systems  Constitutional: Negative for activity change and appetite change.  Eyes: Negative.   Respiratory: Negative.   Cardiovascular: Negative.   Genitourinary: Negative.   Psychiatric/Behavioral: Negative.        Objective:   Physical Exam Vitals and nursing note reviewed.  Constitutional:      Appearance: Normal appearance.  Cardiovascular:     Rate and Rhythm: Normal rate.  Pulmonary:     Effort: Pulmonary effort is normal.  Neurological:     General: No focal deficit present.     Mental Status: He is alert and oriented to person, place, and time.  Psychiatric:        Mood and Affect: Mood normal.        Behavior: Behavior normal.        Assessment & Plan:     ICD-10-CM   1. Type 2 diabetes mellitus without complication, without long-term current use of insulin (HCC)  E11.9   2. Chronic systolic congestive heart failure (HCC)  I50.22   3. Sternal wound dehiscence, initial encounter  T81.32XA     The drain was removed from the right abdominal area the patient should put Vaseline to this once a day for the next couple of days.  It should then closed up and you should not need to apply anything.  The abdominal wound sternal incisions can be covered with a Mepilex border dressing.  I would like to see him back in 2 weeks.

## 2020-05-16 ENCOUNTER — Ambulatory Visit (HOSPITAL_COMMUNITY): Payer: Self-pay | Admitting: Pharmacist

## 2020-05-16 ENCOUNTER — Institutional Professional Consult (permissible substitution): Payer: Medicare HMO | Admitting: Plastic Surgery

## 2020-05-16 ENCOUNTER — Ambulatory Visit (HOSPITAL_COMMUNITY)
Admission: RE | Admit: 2020-05-16 | Discharge: 2020-05-16 | Disposition: A | Discharge status: Home / Self Care | Payer: Medicare HMO | Source: Ambulatory Visit | Attending: Internal Medicine | Admitting: Internal Medicine

## 2020-05-16 DIAGNOSIS — Z7901 Long term (current) use of anticoagulants: Secondary | ICD-10-CM | POA: Insufficient documentation

## 2020-05-16 DIAGNOSIS — Z95811 Presence of heart assist device: Secondary | ICD-10-CM | POA: Diagnosis not present

## 2020-05-16 DIAGNOSIS — T827XXA Infection and inflammatory reaction due to other cardiac and vascular devices, implants and grafts, initial encounter: Secondary | ICD-10-CM

## 2020-05-16 LAB — PROTIME-INR
INR: 1.6 — ABNORMAL HIGH (ref 0.8–1.2)
Prothrombin Time: 18.7 seconds — ABNORMAL HIGH (ref 11.4–15.2)

## 2020-05-16 LAB — LACTATE DEHYDROGENASE: LDH: 261 U/L — ABNORMAL HIGH (ref 98–192)

## 2020-05-16 NOTE — Progress Notes (Signed)
Patient presents for wound vac change in [facility] today with his wife. Reports no problems with VAD equipment or concerns with drive line. Denies any falls, dizziness, lightheadedness, or syncope.   Pt was hospitalized 5/30-6/18 for sternal wound and driveline debridement. Pt discharged on Ancef and Rifabutin until 05/24/20.   Sternal incision care: CDI. Mepilex secure.  Abdominal incision care: Abdominal incision with Mepilex dressing in place. Wife states mepiliex is due to be changed tomorrow. CDI.  JP drain removed yesterday on RLQ at Dr. Eusebio Friendly office. Pt has moderate amount of serousangineous drainage from this area. Does not tunnel. Cleaned with betadine and covered with folded 4 x 4.    Exit Site Care: Drive line is being maintained daily by [employer]. Gauze dressing with anchor intact and accurately applied.  Existing dressing removed using sterile technique. Site cleansed with betadine swab x 2 and allowed to dry. Cellerate applied to wound bed. Topped with saline moistened 2 x 2, mepitel and dry 4 x 4. Scant amount of thick, clear/yellow drainage noted. No foul odor or tenderness. Some redness noted around site with "bumpy" rash but this is much better than last week. Exit site with minimal tissue ingrown. Anchor changed. Continue daily dressing changes using saline moinstened 2 x 2. Covered with driveline dressing kit sponge. Per Dr. Prescott Gum please send Barber home with tube of Cellurate next week to apply to the wound every other day.         Patient Instructions: 1. Return on Tuesday for dressing change and labs  Tanda Rockers RN [place] Coordinator  Office: [phone]  24/7 Pager: [phone]

## 2020-05-16 NOTE — Progress Notes (Signed)
LVAD INR 

## 2020-05-17 ENCOUNTER — Other Ambulatory Visit (HOSPITAL_COMMUNITY): Payer: Medicare HMO

## 2020-05-19 ENCOUNTER — Other Ambulatory Visit (HOSPITAL_COMMUNITY): Payer: Self-pay | Admitting: *Deleted

## 2020-05-19 DIAGNOSIS — Z95811 Presence of heart assist device: Secondary | ICD-10-CM

## 2020-05-19 DIAGNOSIS — Z7901 Long term (current) use of anticoagulants: Secondary | ICD-10-CM

## 2020-05-22 NOTE — Progress Notes (Signed)
Patient: Theodore Welch  DOB: 03/21/1961 MRN: [id number] PCP: Reubin Milan, MD    Patient Active Problem List   Diagnosis Date Noted  . Infection associated with driveline of left ventricular assist device (LVAD) ([place]) 11/28/2018    Priority: High  . Complication involving left ventricular assist device (LVAD) 11/23/2018    Priority: High  . LVAD (left ventricular assist device) present ([place]) 08/03/2018    Priority: High  . Sternal wound dehiscence 05/15/2020  . Acute on chronic combined systolic and diastolic CHF (congestive heart failure) ([place])   . Disseminated zoster 02/12/2020  . Abscess of sternal region 01/27/2020  . Staph aureus infection 01/27/2020  . Vaccine counseling 08/12/2019  . Chronic systolic (congestive) heart failure ([place]) 11/28/2018  . DVT (deep venous thrombosis) ([place]) 08/02/2018  . Advance care planning   . Ascites   . COPD (chronic obstructive pulmonary disease) ([place]) 07/21/2018  . HTN (hypertension) 07/21/2018  . Tobacco use 07/21/2018  . Congestive heart failure ([place]) 07/21/2018  . CHF (congestive heart failure) ([place]) 07/21/2018  . Type 2 diabetes mellitus ([place]) 07/21/2018  . Hyperlipidemia 07/21/2018  . Fibromyalgia syndrome 01/27/2017  . Myofascial pain syndrome, cervical 01/27/2017  . Lumbar radiculopathy 01/24/2014  . History of lumbar laminectomy for spinal cord decompression 12/07/2013  . 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      Subjective:  CC: Hospital follow up MSSA abscess involving proximal driveline s/p repeat debridement.    ID Hx: Theodore Welch is a 59 y.o. 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    January-2020: MSSA infection of counter incision to mid abdomen; s/p serial debridements. Initially did not involve exit site at this time. Received IV Cefazolin x 4 weeks --> PO  Cephalexin x 4 weeks.   [id number]: recurrent MSSA infection, now involving driveline exit site and secondary bacteremia. TEE deferred due to low sensitivity for detecting VAD endocarditis. 6 weeks IV Cefazolin s/p abd wall debridement --> cephalexin for chronic suppression for duration pump is in place.    March-2021 sudden onset subxiphoid abscess involving proximal driveline, MSSA on cultures. Blood cultures negative. S/P I&D of abdominal wall exit site and subxiphoid space. IV cefazolin x 8 weeks (May 10th 2021).  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    Apr 16, 2020 - readmitted following re-opening and bleeding of subxiphoid wound. +MSSA and taken for debridement 6/01. Required repair of outflow graft at this surgery as well (?sharp puncture vs degenerated graft d/t infection). Vertical Rectus Flap repair performed with serial debridements. BC (-). PICC Cefazolin + Rifabutin planned through 7/7   HPI: He is ready to get the picc line out. Feeling well overall and no concerns today. He states that the sternal incision is healed 95% and only a small bandaid covers a dry scab now. His lower mid-abdomen incision where the previous counter incision was is still draining a bit and Theodore Welch changes this once a day. The drainage is clear/rust colored and thin. No odor or purulence. The driveline exit site is clean and has scant to no drainage. Healing in but slowly.  Denies any fevers or chills. No tenderness anywhere along tunneled power cord.   He has seen Dr. Marla Roe with plastic surgery last week to follow up on vertical rectus flap - he was doing very well  and had JP drain pulled. No signs of infection at that time. Has another appointment in a week. Sees the VAD team after our appointment today.    Review of Systems  Constitutional: Negative for chills and fever.  HENT: Negative for tinnitus.   Eyes: Negative for  blurred vision and photophobia.  Respiratory: Negative for cough and sputum production.   Cardiovascular: Negative for chest pain.  Gastrointestinal: Negative for diarrhea, nausea and vomiting.  Genitourinary: Negative for dysuria.  Skin: Negative for rash.  Neurological: Negative for headaches.  PICC line is without pain, drainage or erythema and is well maintained by [facility]. No swelling or altered sensation in affected distal extremity.    Past Medical History:  Diagnosis Date  . 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 Cardiomyopathy, unspecified ([place])   . CHF (congestive heart failure) ([place])   . Chronic back pain   . Diabetes mellitus without complication ([place])   . Enlarged heart   . Gastric ulcer   . Gastroenteritis   . H/O degenerative disc disease   . Hypertension   . LVAD (left ventricular assist device) present [facility])     Outpatient Medications Prior to Visit  Medication Sig Dispense Refill  . acetaminophen (TYLENOL) 500 MG tablet Take 1,000 mg by mouth every 6 (six) hours as needed for mild pain.    Marland Kitchen aspirin EC 81 MG tablet Take 81 mg by mouth daily.    . carbamide peroxide (DEBROX) 6.5 % OTIC solution Place 5 drops into both ears 3 (three) times daily. (Patient taking differently: Place 5 drops into both ears every other day. ) 15 mL 0  . cyanocobalamin 500 MCG tablet Take 1,000 mcg by mouth daily.     Marland Kitchen docusate sodium (COLACE) 100 MG capsule Take 2 capsules (200 mg total) by mouth daily as needed for mild constipation. 10 capsule 0  . fluticasone (FLONASE) 50 MCG/ACT nasal spray Place 1 spray into both nostrils 2 (two) times daily as needed for allergies.     Marland Kitchen glucose blood (CONTOUR NEXT TEST) test strip Use as instructed 100 each 12  . hydrALAZINE (APRESOLINE) 50 MG tablet Take 0.5 tablets (25 mg total) by mouth 3 (three) times daily. (Patient taking differently: Take 50 mg by mouth 3 (three) times daily. ) 90 tablet 5  . Ipratropium-Albuterol (COMBIVENT)  20-100 MCG/ACT AERS respimat Inhale 1 puff into the lungs every 6 (six) hours as needed for wheezing or shortness of breath.     . Lancets (FREESTYLE) lancets Use as instructed 100 each 12  . nystatin (MYCOSTATIN/NYSTOP) powder Apply 1 application topically 3 (three) times daily. 15 g 3  . oxyCODONE (OXY IR/ROXICODONE) 5 MG immediate release tablet Take 1-2 tablets (5-10 mg total) by mouth every 4 (four) hours as needed for moderate pain. 30 tablet 0  . pantoprazole (PROTONIX) 40 MG tablet Take 1 tablet (40 mg total) by mouth daily. 30 tablet 2  . rifabutin (MYCOBUTIN) 150 MG capsule Take 2 capsules (300 mg total) by mouth daily for 18 days. 36 capsule 0  . sacubitril-valsartan (ENTRESTO) 49-51 MG Take 1 tablet by mouth 2 (two) times daily. 60 tablet 0  . sildenafil (VIAGRA) 100 MG tablet Take 50 mg by mouth daily as needed for erectile dysfunction.    Marland Kitchen spironolactone (ALDACTONE) 25 MG tablet Take 1 tablet (25 mg total) by mouth daily. 30 tablet 5  . thiamine 100 MG tablet Take 1 tablet (100 mg total) by mouth daily. [place]  tablet 0  . traMADol (ULTRAM) 50 MG tablet Take 2 tablets (100 mg total) by mouth every 6 (six) hours as needed for severe pain. 20 tablet 0  . varenicline (CHANTIX CONTINUING MONTH PAK) 1 MG tablet Take 1 mg by mouth daily.     . warfarin (COUMADIN) 5 MG tablet Take 7.5 mg (1.5 tab) MWF and 5 mg all other days or as directed by HF Clinic. 100 tablet 3  . Zinc Gluconate 100 MG TABS Take 1 tablet (100 mg total) by mouth daily. (Patient taking differently: Take 100 mg by mouth 2 (two) times a day. ) 90 tablet 3  . zolpidem (AMBIEN) 5 MG tablet TAKE 1 TABLET BY MOUTH EVERY DAY AT BEDTIME (Patient taking differently: Take 5 mg by mouth at bedtime. ) 30 tablet 2  . ceFAZolin (ANCEF) 2-4 GM/100ML-% IVPB Inject 100 mLs (2 g total) into the vein every 8 (eight) hours for 19 days. 1 each 30  . ceFAZolin (ANCEF) IVPB Inject 2 g into the vein every 8 (eight) hours for 22 days. Indication:  LVAD  driveline infection First Dose: No Last Day of Therapy:  05/24/2020 Labs - Once weekly:  CBC/D and BMP, Labs - Every other week:  ESR and CRP Method of administration: IV Push Method of administration may be changed at the discretion of home infusion pharmacist based upon assessment of the patient and/or caregiver's ability to self-administer the medication ordered. 66 Units 0  . gabapentin (NEURONTIN) 300 MG capsule Take 2 capsules (600 mg total) by mouth 4 (four) times daily. 240 capsule 6   No facility-administered medications prior to visit.     Allergies  Allergen Reactions  . Chlorhexidine Rash  . Other Rash    Prep pads    Social History   Tobacco Use  . Smoking status: Current Every Day Smoker    Types: Cigarettes    Start date: 2003  . Smokeless tobacco: Never Used  . Tobacco comment: Nicoderm patch   Vaping Use  . Vaping Use: Never used  Substance Use Topics  . Alcohol use: Not Currently  . Drug use: Not Currently     Objective:   There were no vitals filed for this visit. There is no height or weight on file to calculate BMI.  Physical Exam HENT:     Mouth/Throat:     Mouth: No oral lesions.     Dentition: Normal dentition. No dental caries.  Eyes:     General: No scleral icterus. Cardiovascular:     Rate and Rhythm: Normal rate and regular rhythm.     Heart sounds: Normal heart sounds.  Pulmonary:     Effort: Pulmonary effort is normal.     Breath sounds: Normal breath sounds.  Abdominal:     General: There is no distension.     Palpations: Abdomen is soft.     Tenderness: There is no abdominal tenderness.       Comments: Small shadowing to dressing at lower abdomen  LLQ driveline exit site bandage is clean and dry.  Lower sternal incision is healed up nicely with only a small scab.   Lymphadenopathy:     Cervical: No cervical adenopathy.  Skin:    General: Skin is warm and dry.     Findings: No rash.  Neurological:     Mental Status: He  is alert and oriented to person, place, and time.   RUE PICC line - clean/dry dressing. Insertion site w/o erythema, tenderness, drainage,   cording or distal swelling of affected extremity     Lab Results: Lab Results  Component Value Date   WBC 11.2 (H) 05/10/2020   HGB 10.0 (L) 05/10/2020   HCT 32.9 (L) 05/10/2020   MCV 92.9 05/10/2020   PLT 418 (H) 05/10/2020    Lab Results  Component Value Date   CREATININE 0.81 05/10/2020   BUN 9 05/10/2020   NA 138 05/10/2020   K 4.6 05/10/2020   CL 105 05/10/2020   CO2 22 05/10/2020    Sed Rate (mm/hr)  Date Value  02/21/2020 20 (H)  05/05/2019 4   CRP (mg/dL)  Date Value  02/21/2020 1.2 (H)  05/05/2019 <0.8     Assessment & Plan:   Problem List Items Addressed This Visit      High   Infection associated with driveline of left ventricular assist device (LVAD) ([place]) - Primary    He is doing well overall. No tenderness overlying any of the tunneled perc lead. While the exit site has not healed in yet it is clean and without much drainage per chart review of photos from LVAD clinic.  Will complete the rifabutin in 1 day and stop. Will notify VAD Coumadin pharmacist to increase monitoring for the short term.  He will get in 3 doses of Cephalexin today and start QID dosing tomorrow 05-24-20.  Will have home health pull PICC today if they can.  RTC in 6 weeks.   Will check ESR / CRP and WBC today for therapeutic monitoring on long term antibiotics. INR today as he is due (will forward results)       Relevant Medications   cephALEXin (KEFLEX) 500 MG capsule   Other Relevant Orders   C-reactive protein   Sedimentation rate   CBC     Unprioritized   Abscess of sternal region    Other Visit Diagnoses    Chronic anticoagulation       Relevant Orders   Protime-INR      Return in about 6 weeks (around 07/04/2020).   Theodore Madeira, MSN, NP-C [facility] [email or web address] Pager: [phone] Office: [phone] [place]: [phone]

## 2020-05-23 ENCOUNTER — Ambulatory Visit (HOSPITAL_COMMUNITY): Payer: Self-pay | Admitting: Pharmacist

## 2020-05-23 ENCOUNTER — Ambulatory Visit (HOSPITAL_COMMUNITY)
Admission: RE | Admit: 2020-05-23 | Discharge: 2020-05-23 | Disposition: A | Discharge status: Home / Self Care | Payer: Medicare HMO | Source: Ambulatory Visit | Attending: Cardiology | Admitting: Cardiology

## 2020-05-23 ENCOUNTER — Encounter: Payer: Self-pay | Admitting: Infectious Diseases

## 2020-05-23 ENCOUNTER — Other Ambulatory Visit: Payer: Self-pay

## 2020-05-23 ENCOUNTER — Ambulatory Visit (INDEPENDENT_AMBULATORY_CARE_PROVIDER_SITE_OTHER): Payer: Medicare HMO | Admitting: Infectious Diseases

## 2020-05-23 DIAGNOSIS — L299 Pruritus, unspecified: Secondary | ICD-10-CM | POA: Diagnosis not present

## 2020-05-23 DIAGNOSIS — L02213 Cutaneous abscess of chest wall: Secondary | ICD-10-CM

## 2020-05-23 DIAGNOSIS — Z7901 Long term (current) use of anticoagulants: Secondary | ICD-10-CM

## 2020-05-23 DIAGNOSIS — T827XXA Infection and inflammatory reaction due to other cardiac and vascular devices, implants and grafts, initial encounter: Secondary | ICD-10-CM

## 2020-05-23 DIAGNOSIS — Z4801 Encounter for change or removal of surgical wound dressing: Secondary | ICD-10-CM | POA: Insufficient documentation

## 2020-05-23 DIAGNOSIS — Z95811 Presence of heart assist device: Secondary | ICD-10-CM | POA: Insufficient documentation

## 2020-05-23 LAB — FUNGUS CULTURE WITH STAIN

## 2020-05-23 LAB — FUNGUS CULTURE RESULT

## 2020-05-23 LAB — FUNGAL ORGANISM REFLEX

## 2020-05-23 MED ORDER — CEPHALEXIN 500 MG PO CAPS
500.0000 mg | ORAL_CAPSULE | Freq: Four times a day (QID) | ORAL | 5 refills | Status: DC
Start: 2020-05-23 — End: 2020-06-18

## 2020-05-23 MED ORDER — HYDROXYZINE HCL 50 MG PO TABS: 50.0000 mg | ORAL_TABLET | Freq: Three times a day (TID) | ORAL | 3 refills | Status: DC | PRN

## 2020-05-23 NOTE — Patient Instructions (Addendum)
Nice to see you again today!   Continue your Rifabutin until that is done.   Will call your home health team to have your PICC line taken out today.   Start taking your keflex today - get three doses in today  Starting tomorrow 7/7 I want you to start taking it 4 times a day.   Please come back to see me in 6 weeks.   I drew an INR for you today and will send results to Lauren at the LVAD clinic.

## 2020-05-23 NOTE — Progress Notes (Signed)
LVAD INR 

## 2020-05-23 NOTE — Addendum Note (Signed)
Encounter addended by: Bernita Raisin, RN on: 05/23/2020 1:36 PM  Actions taken: Pend clinical note, Clinical Note Signed

## 2020-05-23 NOTE — Addendum Note (Signed)
Encounter addended by: Bernita Raisin, RN on: 05/23/2020 11:13 AM  Actions taken: Visit diagnoses modified, Pharmacy for encounter modified, Order list changed, Diagnosis association updated

## 2020-05-23 NOTE — Progress Notes (Signed)
Patient presents for wound vac change in [facility] today with his wife. Reports no problems with VAD equipment or concerns with drive line. Denies any falls, dizziness, lightheadedness, or syncope.   Pt was hospitalized 5/30-6/18 for sternal wound and driveline debridement. Pt discharged on Ancef and Rifabutin until 05/24/20.   Sternal incision care: Bandaid in place. Removed; small intact scab noted. Cleansed with betadine. Left open to air.    Abdominal incision care: Abdominal incision with Mepilex dressing in place. Dressing removed. Moderate amount of serosanguinous drainage from lower portion of incision. Cleansed with betadine. Covered with Mepilex dressing. Provided Mepilex dressings for home use.    JP drain site. Scant old bloody drainage on dressing. Cleaned with betadine and covered with folded 4 x 4.    Exit Site Care: Drive line is being maintained daily by [employer]. Gauze dressing with anchor intact and accurately applied.  Existing dressing removed using sterile technique. Site cleansed with betadine swab x 2 and allowed to dry. Cellerate applied to wound bed. Topped with saline moistened 2 x 2, mepitel and dry 4 x 4. Scant amount of thick, clear/yellow drainage noted. No foul odor or tenderness. Some redness noted around site with "bumpy" rash but this is much better than last week. Exit site with minimal tissue ingrown. Anchor changed. Continue daily dressing changes using saline moinstened 2 x 2. Covered with driveline dressing kit sponge. Continue Cellerate to wound bed every other day. Provided with 14 daily dressing kits, saline flushes, cotton tip applicators, and 2 x 2s for home use.      Patient Instructions: 1. Return on Monday for full visit.    Emerson Monte RN [place] Coordinator  Office: [phone]  24/7 Pager: [phone]

## 2020-05-23 NOTE — Assessment & Plan Note (Signed)
He is doing well overall. No tenderness overlying any of the tunneled perc lead. While the exit site has not healed in yet it is clean and without much drainage per chart review of photos from LVAD clinic.  Will complete the rifabutin in 1 day and stop. Will notify VAD Coumadin pharmacist to increase monitoring for the short term.  He will get in 3 doses of Cephalexin today and start QID dosing tomorrow 05-24-20.  Will have home health pull PICC today if they can.  RTC in 6 weeks.   Will check ESR / CRP and WBC today for therapeutic monitoring on long term antibiotics. INR today as he is due (will forward results)

## 2020-05-24 LAB — CBC
HCT: 35 % — ABNORMAL LOW (ref 38.5–50.0)
Hemoglobin: 11.3 g/dL — ABNORMAL LOW (ref 13.2–17.1)
MCH: 27.6 pg (ref 27.0–33.0)
MCHC: 32.3 g/dL (ref 32.0–36.0)
MCV: 85.6 fL (ref 80.0–100.0)
MPV: 9.1 fL (ref 7.5–12.5)
Platelets: 324 10*3/uL (ref 140–400)
RBC: 4.09 10*6/uL — ABNORMAL LOW (ref 4.20–5.80)
RDW: 12.7 % (ref 11.0–15.0)
WBC: 9.1 10*3/uL (ref 3.8–10.8)

## 2020-05-24 LAB — PROTIME-INR
INR: 2 — ABNORMAL HIGH
Prothrombin Time: 20.3 s — ABNORMAL HIGH (ref 9.0–11.5)

## 2020-05-24 LAB — C-REACTIVE PROTEIN: CRP: 12.6 mg/L — ABNORMAL HIGH (ref <8.0)

## 2020-05-24 LAB — SEDIMENTATION RATE: Sed Rate: 6 mm/h (ref 0–20)

## 2020-05-24 NOTE — Progress Notes (Signed)
CRP up from 63m ago but no baseline with current wound flare.  Will follow again in 6 weeks after transition back to QID cephalexin. Consider repeat scan if still elevatedClinically he is improved and non-tender to the abdomen at any previous incision sites.

## 2020-05-24 NOTE — Progress Notes (Signed)
Spoke with Coretta gave verbal order per Rexene Alberts to pull picc line today. Verbal order read back and understood.  Valarie Cones

## 2020-05-26 ENCOUNTER — Other Ambulatory Visit (HOSPITAL_COMMUNITY): Payer: Self-pay | Admitting: *Deleted

## 2020-05-26 DIAGNOSIS — Z7901 Long term (current) use of anticoagulants: Secondary | ICD-10-CM

## 2020-05-26 DIAGNOSIS — Z95811 Presence of heart assist device: Secondary | ICD-10-CM

## 2020-05-29 ENCOUNTER — Ambulatory Visit (HOSPITAL_COMMUNITY): Payer: Self-pay | Admitting: Pharmacist

## 2020-05-29 ENCOUNTER — Other Ambulatory Visit: Payer: Self-pay

## 2020-05-29 ENCOUNTER — Ambulatory Visit (HOSPITAL_COMMUNITY)
Admission: RE | Admit: 2020-05-29 | Discharge: 2020-05-29 | Disposition: A | Discharge status: Home / Self Care | Payer: Medicare HMO | Source: Ambulatory Visit | Attending: Internal Medicine | Admitting: Internal Medicine

## 2020-05-29 ENCOUNTER — Encounter (HOSPITAL_COMMUNITY): Payer: Medicare HMO

## 2020-05-29 VITALS — BP 90/0 | HR 97 | Ht 71.0 in | Wt 200.2 lb

## 2020-05-29 DIAGNOSIS — G4733 Obstructive sleep apnea (adult) (pediatric): Secondary | ICD-10-CM | POA: Diagnosis not present

## 2020-05-29 DIAGNOSIS — K219 Gastro-esophageal reflux disease without esophagitis: Secondary | ICD-10-CM | POA: Insufficient documentation

## 2020-05-29 DIAGNOSIS — E785 Hyperlipidemia, unspecified: Secondary | ICD-10-CM | POA: Insufficient documentation

## 2020-05-29 DIAGNOSIS — I5022 Chronic systolic (congestive) heart failure: Secondary | ICD-10-CM | POA: Insufficient documentation

## 2020-05-29 DIAGNOSIS — K7030 Alcoholic cirrhosis of liver without ascites: Secondary | ICD-10-CM | POA: Insufficient documentation

## 2020-05-29 DIAGNOSIS — Z86718 Personal history of other venous thrombosis and embolism: Secondary | ICD-10-CM | POA: Insufficient documentation

## 2020-05-29 DIAGNOSIS — E119 Type 2 diabetes mellitus without complications: Secondary | ICD-10-CM | POA: Diagnosis not present

## 2020-05-29 DIAGNOSIS — Z79899 Other long term (current) drug therapy: Secondary | ICD-10-CM | POA: Insufficient documentation

## 2020-05-29 DIAGNOSIS — Z95811 Presence of heart assist device: Secondary | ICD-10-CM | POA: Insufficient documentation

## 2020-05-29 DIAGNOSIS — F17210 Nicotine dependence, cigarettes, uncomplicated: Secondary | ICD-10-CM | POA: Insufficient documentation

## 2020-05-29 DIAGNOSIS — T827XXA Infection and inflammatory reaction due to other cardiac and vascular devices, implants and grafts, initial encounter: Secondary | ICD-10-CM | POA: Insufficient documentation

## 2020-05-29 DIAGNOSIS — T829XXD Unspecified complication of cardiac and vascular prosthetic device, implant and graft, subsequent encounter: Secondary | ICD-10-CM

## 2020-05-29 DIAGNOSIS — Z7901 Long term (current) use of anticoagulants: Secondary | ICD-10-CM | POA: Diagnosis not present

## 2020-05-29 DIAGNOSIS — Z720 Tobacco use: Secondary | ICD-10-CM

## 2020-05-29 DIAGNOSIS — Z7982 Long term (current) use of aspirin: Secondary | ICD-10-CM | POA: Insufficient documentation

## 2020-05-29 DIAGNOSIS — Z4801 Encounter for change or removal of surgical wound dressing: Secondary | ICD-10-CM | POA: Insufficient documentation

## 2020-05-29 DIAGNOSIS — L089 Local infection of the skin and subcutaneous tissue, unspecified: Secondary | ICD-10-CM

## 2020-05-29 DIAGNOSIS — T148XXA Other injury of unspecified body region, initial encounter: Secondary | ICD-10-CM

## 2020-05-29 DIAGNOSIS — I420 Dilated cardiomyopathy: Secondary | ICD-10-CM | POA: Insufficient documentation

## 2020-05-29 LAB — BASIC METABOLIC PANEL
Anion gap: 10 (ref 5–15)
BUN: 7 mg/dL — ABNORMAL LOW (ref 8–23)
CO2: 23 mmol/L (ref 22–32)
Calcium: 9.2 mg/dL (ref 8.9–10.3)
Chloride: 108 mmol/L (ref 98–111)
Creatinine, Ser: 0.8 mg/dL (ref 0.61–1.24)
GFR calc Af Amer: >60 mL/min (ref >60)
GFR calc non Af Amer: >60 mL/min (ref >60)
Glucose, Bld: 122 mg/dL — ABNORMAL HIGH (ref 70–99)
Potassium: 4.2 mmol/L (ref 3.5–5.1)
Sodium: 141 mmol/L (ref 135–145)

## 2020-05-29 LAB — CBC
HCT: 36.3 % — ABNORMAL LOW (ref 39.0–52.0)
Hemoglobin: 11 g/dL — ABNORMAL LOW (ref 13.0–17.0)
MCH: 27 pg (ref 26.0–34.0)
MCHC: 30.3 g/dL (ref 30.0–36.0)
MCV: 89.2 fL (ref 80.0–100.0)
Platelets: 275 10*3/uL (ref 150–400)
RBC: 4.07 MIL/uL — ABNORMAL LOW (ref 4.22–5.81)
RDW: 13.2 % (ref 11.5–15.5)
WBC: 8.2 10*3/uL (ref 4.0–10.5)
nRBC: 0 % (ref 0.0–0.2)

## 2020-05-29 LAB — PROTIME-INR
INR: 2.5 — ABNORMAL HIGH (ref 0.8–1.2)
Prothrombin Time: 26.1 seconds — ABNORMAL HIGH (ref 11.4–15.2)

## 2020-05-29 LAB — LACTATE DEHYDROGENASE: LDH: 201 U/L — ABNORMAL HIGH (ref 98–192)

## 2020-05-29 NOTE — Progress Notes (Signed)
LVAD INR 

## 2020-05-29 NOTE — Progress Notes (Signed)
Show:Clear all _0 Manual_1 Template_2 Copied  Added by: _3 Theodore Gully, RN  _4 Hover for details Patient presents for hospital f/u in [facility] today with his wife. Reports no problems with VAD equipment or concerns with drive line. Denies any falls, dizziness, lightheadedness, or syncope.   Pt was hospitalized 3/9-3/24 for sternal wound and driveline debridement pt was on IV antibiotics but is now on Keflex 500 mg qid.  See wound care notes below.  Vital Signs:  Doppler Pressure: 90 Automatc BP: 117/59 (82) HR: 97 SPO2: 100% RA  Weight: 200.2 lb w/o eqt Last weight: 194.2 lb  VAD Indication: Destination Therapy due to smoking status  VAD interrogation & Equipment Management: Speed: 5700 Flow:  5.1 Power:  4.5w PI: 3.1 Hct: 36  Alarms: no clinical alarms Events: 40 - 50+ PI events daily Fixed speed: 5700 Low speed limit: 5400  Primary Controller: Replace back up battery in 13 months. Back up controller: Replace back up battery in 68month.  Annual Equipment Maintenance on UBC/PM was performed on 07/2019.  I reviewed the LVAD parameters from todayand compared the results to the patient's prior recorded data.LVAD interrogation was NEGATIVEfor significant power changes, NEGATIVEfor clinicalalarms and NEGATIVE for PI events/speed drops. No programming changes were madeand pump is functioning within specified parameters. Pt is performing daily controller and system monitor self tests along with completing weekly and monthly maintenance for LVAD equipment.  LVAD equipment check completed and is in good working order. Back-up equipment present.   Sternal incision:   Abdominal incision care: Abdominal incision with Mepilex dressing in place. Dressing removed. Moderate amount of serosanguinous drainage from lower portion of incision. Cleansed with betadine. Opened area on the bottom of incision-large amount of serousangineous  expressed-tunnels 3 cm. Culture sent. Packed with plain packing gauze and covered with Ca Alginate strip and Mepilex. Provided Mepilex dressings for home use.Wife instructed to change daily or as needed when saturated. Pt given Ca Alginate and plain packing strips for home use.      Exit Site Care: Drive line is being [facility] dressing with anchor intact and accurately applied. Existing dressing removed using sterile technique. Site cleansed with betadine swab x 2 and allowed to dry. Cellerate applied to wound bed. Topped with saline moistened 2 x 2, mepitel and dry 4 x 4. Scant amount of thick, clear/yellow drainage noted. No foul odor or tenderness. Exit site with minimal tissue ingrown. Anchor changed. Continue daily dressing changes using saline moinstened 2 x 2. Covered with driveline dressing kit sponge. Continue Cellerate to wound bed every other day. Provided with 14 daily dressing kits, saline flushes, cotton tip applicators, and 2 x 2s for home use.     Device:N/A  BP &Labs:  Doppler 90- Doppler is reflecting MAP  Hgb 11- No S/S of bleeding. Specifically denies melena/BRBPR or nosebleeds.  LDH stable at 201 with established baseline of 150 - 220. Denies tea-colored urine. No power elevations noted on interrogation.    Patient Instructions: 1. No change in medications 2. Lower abdomen dressing and driveline dressing change daily. 3. Return to clinic next week for wound care  STanda RockersRN VColeridgeCoordinator  Office: [phone] 24/7 Pager: [phone]         59y.o. with history of nonischemic cardiomyopathy, RLE DVT, cirrhosis, smoking, and OSA returns for followup of CHF/LVAD placement.  [place]  He was admitted  to [facility] in 9/19 with low output HF and was started on milrinone and diuresed.  Unable to wean off milrinone.  He had a degree of  RV failure, but this improved on milrinone.  Valvular heart disease also looked better with milrinone and diuresis.  On 08/03/18, he had Heartmate 3 LVAD placed.  Speed was optimized by ramp echo post-op.  Post-op course was relatively unremarkable.  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  No abscess on CT.  He was started on cefazolin IV for 6 wks.  BP-active meds decreased with low MAP.   He was admitted in 3/21 with subxiphoid abscess and cellulitis.  CT showed collection along the course of the driveline.  There was no evidence for sternal osteomyelitis.  Abscess was debrided, MSSA grew from wound cultures.  Wound vac was placed.  Patient was started on cefazolin, which was recently stopped after completing 8 wks.   Patient was re-admitted later in 3/21 with facial Zoster.  He was treated with acyclovir and this has resolved.   He was admitted in 6/21 with bleeding from the site of the subxiphoid abscess as well as cellulitis.  He required multiple units of PRBCs.  He went to the OR initially for I&D.  Blood and wound cultures grew MSSA.  He then went back to the OR for rectus flap over wound site (plastics).  He was started on cefazolin and rifabutin, has PICC.    Today he returns for HF follow up.Overall feeling fine. Denies SOB/PND/Orthopnea. Appetite ok. No fever or chills. Weight at home has been stable. Denies BRBPR. Using CPAP nightly. Wife has been changins dressing to abdomen. Taking all medications.  Denies LVAD alarms.  Reports taking Coumadin as prescribed and adherence to anticoagulation based dietary restrictions.  Denies bright red blood per rectum or melena, no dark urine or hematuria.    Labs (9/19): LDH 210, INR 2.17, WBCs 17.1 => 15, hgb 9.7, creatinine 0.72 Labs (10/19): K 4.3, creatinine 0.78 => 0.83, hgb 10.2 Labs (11/19): creatinine 0.79, hgb 10 Labs (2/20): K 3.9, creatinine  0.79 Labs (4/20): K 4, creatinine 1.15, hgb 11.4, LDH 199, INR 1.9 Labs (5/20): hgb 10.4, WBCs 8.5 Labs (6/20): K 4.2, creatinine 1.06, hgb 11.2 Labs (8/20): k 3.7, creatinine 1.0, hgb 11.5, LDH 186 Labs (10/20): K 3.5, creatinine 0.93, INR 2, LDH 221 Labs (12/20): hgb 15.6, K 3.7, creatinine 1.02, LDH 250 Labs (4/21): K 4.1, creatinine 0.86, hgb 11.1 Labs (6/21): K 4.1, creatinine 0.76, hgb 10, WBCs 11.2  PMH: 1. Degenerative disc disease.  2. GERD 3. Chronic systolic CHF:  Nonischemic cardiomyopathy.  Dilated cardiomyopathy diagnosed 6/19 in [place].  LHC/RHC in 7/19 showed elevated filling pressures, low cardiac output, and no significant CAD.   - Echo (9/19): Severe LV dilation with EF 10-20%, moderate-severe MR, severely dilated RV with mildly decreased systolic function, severe TR.  - Cardiac MRI (9/19): EF 14%, moderate dilated LV, severely dilated RV with mod-severe systolic function and EF 85%, nonspecific RV insertion site LGE.  - RHC (5/19) on milrinone 0.375: mean RA 8, PA 40/19, mean PCWP 12, PAPi 2.65, CI 2.71.  - Echo (9/19) on milrinone and diuresed): EF 20-25% with moderate LV dilation, moderately dilated RV with mildly decreased systolic function, mild-moderate MR, moderate TR, PASP 51 mmHg.  Cannot rule out noncompaction.  - Heartmate 3 LVAD placement in 9/19.  4. OSA: CPAP use.  5. Prior smoker 6. Type 2 diabetes 7. Hyperlipidemia.  8. H/o NSVT 9. 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  10. RLE DVT: found in 9/19.  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 Subxiphoid abscess involving collection along driveline in 2/20, MSSA.  6/21 Rectus flap to cover abscess site.  12. Facial Zoster 3/21  Current Outpatient Medications  Medication Sig Dispense Refill  . acetaminophen (TYLENOL) 500 MG tablet Take 1,000 mg by mouth every 6 (six) hours as needed for mild pain.    Marland Kitchen aspirin EC 81 MG tablet Take 81 mg by mouth  daily.    . carbamide peroxide (DEBROX) 6.5 % OTIC solution Place 5 drops into both ears 3 (three) times daily. 15 mL 0  . cephALEXin (KEFLEX) 500 MG capsule Take 1 capsule (500 mg total) by mouth 4 (four) times daily. 120 capsule 5  . cyanocobalamin 500 MCG tablet Take 1,000 mcg by mouth daily.     Marland Kitchen docusate sodium (COLACE) 100 MG capsule Take 2 capsules (200 mg total) by mouth daily as needed for mild constipation. 10 capsule 0  . fluticasone (FLONASE) 50 MCG/ACT nasal spray Place 1 spray into both nostrils 2 (two) times daily as needed for allergies.     Marland Kitchen gabapentin (NEURONTIN) 300 MG capsule Take 2 capsules (600 mg total) by mouth 4 (four) times daily. 240 capsule 6  . glucose blood (CONTOUR NEXT TEST) test strip Use as instructed 100 each 12  . hydrALAZINE (APRESOLINE) 50 MG tablet Take 0.5 tablets (25 mg total) by mouth 3 (three) times daily. (Patient taking differently: Take 50 mg by mouth 3 (three) times daily. ) 90 tablet 5  . hydrOXYzine (ATARAX/VISTARIL) 50 MG tablet Take 1 tablet (50 mg total) by mouth 3 (three) times daily as needed for itching. 40 tablet 3  . Ipratropium-Albuterol (COMBIVENT) 20-100 MCG/ACT AERS respimat Inhale 1 puff into the lungs every 6 (six) hours as needed for wheezing or shortness of breath.     . Lancets (FREESTYLE) lancets Use as instructed 100 each 12  . oxyCODONE (OXY IR/ROXICODONE) 5 MG immediate release tablet Take 1-2 tablets (5-10 mg total) by mouth every 4 (four) hours as needed for moderate pain. 30 tablet 0  . pantoprazole (PROTONIX) 40 MG tablet Take 1 tablet (40 mg total) by mouth daily. 30 tablet 2  . sacubitril-valsartan (ENTRESTO) 49-51 MG Take 1 tablet by mouth 2 (two) times daily. 60 tablet 0  . sildenafil (VIAGRA) 100 MG tablet Take 50 mg by mouth daily as needed for erectile dysfunction.    Marland Kitchen spironolactone (ALDACTONE) 25 MG tablet Take 1 tablet (25 mg total) by mouth daily. 30 tablet 5  . thiamine 100 MG tablet Take 1 tablet (100 mg total)  by mouth daily. 30 tablet 0  . varenicline (CHANTIX CONTINUING MONTH PAK) 1 MG tablet Take 1 mg by mouth daily.     Marland Kitchen warfarin (COUMADIN) 5 MG tablet Take 7.5 mg (1.5 tab) MWF and 5 mg all other days or as directed by HF Clinic. 100 tablet 3  . Zinc Gluconate 100 MG TABS Take 1 tablet (100 mg total) by mouth daily. 90 tablet 3  . zolpidem (AMBIEN) 5 MG tablet TAKE 1 TABLET BY MOUTH EVERY DAY AT BEDTIME 30 tablet 2  . nystatin (MYCOSTATIN/NYSTOP) powder Apply 1 application topically 3 (three) times daily. (Patient not taking: Reported on 05/29/2020) 15 g 3  . traMADol (ULTRAM) 50 MG tablet Take 2 tablets (100  mg total) by mouth every 6 (six) hours as needed for severe pain. (Patient not taking: Reported on 05/29/2020) 20 tablet 0   No current facility-administered medications for this encounter.    Chlorhexidine and Other  REVIEW OF SYSTEMS: All systems negative except as listed in HPI, PMH and Problem list.   LVAD INTERROGATION:  Please see LVAD nurse's note above for details.    I reviewed the LVAD parameters from today, and compared the results to the patient's prior recorded data.  No programming changes were made.  The LVAD is functioning within specified parameters.  The patient performs LVAD self-test daily.  LVAD interrogation was negative for any significant power changes, alarms or PI events/speed drops.  LVAD equipment check completed and is in good working order.  Back-up equipment present.   LVAD education done on emergency procedures and precautions and reviewed exit site care.   Wt Readings from Last 3 Encounters:  05/29/20 90.8 kg (200 lb 3.2 oz)  05/15/20 88 kg (194 lb)  05/10/20 88.1 kg (194 lb 3.2 oz)     Vitals:   05/29/20 1259 05/29/20 1300  BP: (!) 117/59 (!) 90/0  Pulse: 97   SpO2: 100%     Physical Exam: GENERAL: Well appearing, male who presents to clinic today in no acute distress. HEENT: normal  NECK: Supple, JVP 5-6  .  2+ bilaterally, no bruits.  No  lymphadenopathy or thyromegaly appreciated.   CARDIAC:  Mechanical heart sounds with LVAD hum present.  LUNGS:  Clear to auscultation bilaterally.  ABDOMEN:  Soft, round, nontender, positive bowel sounds x4.   R side of abdomen now with small opening with sero sang exudate.   LVAD exit site:  Full thickness wound around driveline with granulation tissue noted. Dressing change per Dr Brandy Hale instructions.  EXTREMITIES:  Warm and dry, no cyanosis, clubbing, rash or edema  NEUROLOGIC:  Alert and oriented x 4.  Gait steady.  No aphasia.  No dysarthria.  Affect pleasant.        ASSESSMENT AND PLAN:   1. 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  LVAD parameters stable but still with frequent PIs, no suction.  Frequent PIs may be related to systemic hypertension, seem to be improving now with lower MAP.   NYHA I-II. Volume status stable. Does not need lasix. - Continue spironolactone 25 mg daily.  - Continue Entresto 49/51 bid.  - Continue hydralazine 50 mg tid.  - Continue ASA 81 daily.  - Continue warfarin for INR 2-2.5. INR stable. Hgb stable.   - Should be transplant candidate eventually if he can quit smoking.  Will make transplant clinic referral when he is off totally.  2. Smoking: He has cut back a lot, still taking Chantix. Encouraged to quit.  3. RLE DVT: On warfarin for LVAD.  4. OSA: Continue CPAP. Continue nightly .  5. Hyperlipidemia: Atorvastatin.  6. Type II diabetes: Per PCP.  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   - Continue cefazolin via PICC and rifabutin per ID.  After these antibiotics are completed, he will have long-term Keflex and doxycycline for MSSA suppression.  - Continue wound care per Dr Darcey Nora.  - R side of abdomen now with opening within scar- moderate sero/sang exudate. Packed with packing strip and and covered with calcium alginate. His wife will  change daily.  - Check CBC- WBC not elevated. Check culture.  -  Followup with plastics scheduled.    Check CBC, BMET, LDH, INR today. ---> Reviewed.   Follow up next week to reassess.   See VAD coordinators note above.   Aravind Chrismer NP-C  05/29/2020

## 2020-05-29 NOTE — Progress Notes (Signed)
Patient presents for hospital f/u in [facility] today with his wife. Reports no problems with VAD equipment or concerns with drive line. Denies any falls, dizziness, lightheadedness, or syncope.   Pt was hospitalized 3/9-3/24 for sternal wound and driveline debridement pt was on IV antibiotics but is now on Keflex 500 mg qid.  See wound care notes below.  Vital Signs:  Doppler Pressure: 90 Automatc BP: 117/59 (82) HR: 97 SPO2: 100% RA  Weight: 200.2 lb w/o eqt Last weight: 194.2 lb  VAD Indication: Destination Therapy due to smoking status  VAD interrogation & Equipment Management: Speed: 5700 Flow:  5.1 Power:  4.5w    PI: 3.1 Hct: 36  Alarms: no clinical alarms Events: 40 - 50+ PI events daily Fixed speed: 5700 Low speed limit: 5400  Primary Controller:  Replace back up battery in 13 months. Back up controller:   Replace back up battery in 26 months.  Annual Equipment Maintenance on UBC/PM was performed on 07/2019.   I reviewed the LVAD parameters from today and compared the results to the patient's prior recorded data. LVAD interrogation was NEGATIVE for significant power changes, NEGATIVE for clinical alarms and NEGATIVE for PI events/speed drops. No programming changes were made and pump is functioning within specified parameters. Pt is performing daily controller and system monitor self tests along with completing weekly and monthly maintenance for LVAD equipment.  LVAD equipment check completed and is in good working order. Back-up equipment present.   Sternal incision:   Abdominal incision care: Abdominal incision with Mepilex dressing in place. Dressing removed. Moderate amount of serosanguinous drainage from lower portion of incision. Cleansed with betadine. Opened area on the bottom of incision-large amount of serousangineous expressed-tunnels 3 cm. Culture sent. Packed with plain packing gauze and covered with Ca Alginate strip and Mepilex. Provided Mepilex  dressings for home use. Wife instructed to change daily or as needed when saturated. Pt given Ca Alginate and plain packing strips for home use.      Exit Site Care: Drive line is being maintained daily by [employer]. Gauze dressing with anchor intact and accurately applied.  Existing dressing removed using sterile technique. Site cleansed with betadine swab x 2 and allowed to dry. Cellerate applied to wound bed. Topped with saline moistened 2 x 2, mepitel and dry 4 x 4. Scant amount of thick, clear/yellow drainage noted. No foul odor or tenderness. Exit site with minimal tissue ingrown. Anchor changed. Continue daily dressing changes using saline moinstened 2 x 2. Covered with driveline dressing kit sponge. Continue Cellerate to wound bed every other day. Provided with 14 daily dressing kits, saline flushes, cotton tip applicators, and 2 x 2s for home use.      Device: N/A   BP & Labs:  Doppler 90 - Doppler is reflecting MAP   Hgb 11 - No S/S of bleeding. Specifically denies melena/BRBPR or nosebleeds.   LDH stable at 201 with established baseline of 150 - 220. Denies tea-colored urine. No power elevations noted on interrogation.    Patient Instructions: 1. No change in medications 2. Lower abdomen dressing and driveline dressing change daily. 3. Return to clinic next week for wound care  Tanda Rockers RN [place] Coordinator  Office: [phone]  24/7 Pager: [phone]

## 2020-05-30 ENCOUNTER — Other Ambulatory Visit: Payer: Self-pay | Admitting: *Deleted

## 2020-05-30 ENCOUNTER — Ambulatory Visit (INDEPENDENT_AMBULATORY_CARE_PROVIDER_SITE_OTHER): Payer: Medicare HMO | Admitting: Plastic Surgery

## 2020-05-30 ENCOUNTER — Other Ambulatory Visit (HOSPITAL_COMMUNITY): Payer: Self-pay | Admitting: *Deleted

## 2020-05-30 ENCOUNTER — Encounter: Payer: Self-pay | Admitting: Plastic Surgery

## 2020-05-30 ENCOUNTER — Other Ambulatory Visit (HOSPITAL_COMMUNITY): Payer: Self-pay | Admitting: Cardiology

## 2020-05-30 VITALS — BP 104/79 | HR 88 | Temp 98.6°F

## 2020-05-30 DIAGNOSIS — Z95811 Presence of heart assist device: Secondary | ICD-10-CM

## 2020-05-30 DIAGNOSIS — E119 Type 2 diabetes mellitus without complications: Secondary | ICD-10-CM

## 2020-05-30 DIAGNOSIS — T8130XA Disruption of wound, unspecified, initial encounter: Secondary | ICD-10-CM

## 2020-05-30 DIAGNOSIS — I159 Secondary hypertension, unspecified: Secondary | ICD-10-CM

## 2020-05-30 DIAGNOSIS — I509 Heart failure, unspecified: Secondary | ICD-10-CM

## 2020-05-30 DIAGNOSIS — I5022 Chronic systolic (congestive) heart failure: Secondary | ICD-10-CM

## 2020-05-30 DIAGNOSIS — T829XXD Unspecified complication of cardiac and vascular prosthetic device, implant and graft, subsequent encounter: Secondary | ICD-10-CM

## 2020-05-30 MED ORDER — SACUBITRIL-VALSARTAN 49-51 MG PO TABS: 1.0000 | ORAL_TABLET | Freq: Two times a day (BID) | ORAL | 0 refills | Status: DC

## 2020-05-30 NOTE — Progress Notes (Signed)
   Subjective:    Patient ID: Theodore Welch, male    DOB: [birth year], 59 y.o.   MRN: [id number]  The patient is a 59 year old male here for follow-up after undergoing surgery for a sternal and LVAD infection.  His surgery was June 10.  The LVAD is still in place.  Has a 5 mm area of opening on the abdomen.  The heart and vascular folks have been packing it with Nu Gauze.  It tracks approximately 3 cm deep.  It looks like serous that is draining.  The patient states that they did a culture at the clinic.     Review of Systems  Constitutional: Negative.   Eyes: Negative.   Respiratory: Negative.   Genitourinary: Negative.   Musculoskeletal: Negative.   Skin: Positive for wound.       Objective:   Physical Exam Vitals and nursing note reviewed.  Constitutional:      Appearance: Normal appearance.  HENT:     Head: Normocephalic and atraumatic.  Cardiovascular:     Rate and Rhythm: Normal rate.     Pulses: Normal pulses.  Pulmonary:     Effort: Pulmonary effort is normal.  Abdominal:    Neurological:     General: No focal deficit present.     Mental Status: He is alert and oriented to person, place, and time.  Psychiatric:        Mood and Affect: Mood normal.        Assessment & Plan:     ICD-10-CM   1. Chronic systolic congestive heart failure (HCC)  I50.22   2. Congestive heart failure, unspecified HF chronicity, unspecified heart failure type (HCC)  I50.9   3. Type 2 diabetes mellitus without complication, without long-term current use of insulin (HCC)  E11.9   4. Complication involving left ventricular assist device (LVAD), subsequent encounter  T82.9XXD   5. Abdominal wound dehiscence, initial encounter  T81.30XA     I placed donated a cell sheet and powder in the area I would like him to cover it with KY gel for the next 3 days.  He can then start packing it again for the weekend.  I had like to see him back in 1 to 2 weeks.  The patient agrees with this plan.

## 2020-05-31 LAB — AEROBIC CULTURE W GRAM STAIN (SUPERFICIAL SPECIMEN): Gram Stain: NONE SEEN

## 2020-06-02 ENCOUNTER — Telehealth (HOSPITAL_COMMUNITY): Payer: Self-pay | Admitting: Nurse Practitioner

## 2020-06-02 ENCOUNTER — Other Ambulatory Visit (HOSPITAL_COMMUNITY): Payer: Self-pay | Admitting: Unknown Physician Specialty

## 2020-06-02 DIAGNOSIS — Z7901 Long term (current) use of anticoagulants: Secondary | ICD-10-CM

## 2020-06-02 DIAGNOSIS — Z95811 Presence of heart assist device: Secondary | ICD-10-CM

## 2020-06-02 NOTE — Telephone Encounter (Signed)
Theodore Welch from [place] [employer] accounting [phone],  Trying to verify auth for pt VAD clinic upcoming visit. Please return call with info.

## 2020-06-05 ENCOUNTER — Other Ambulatory Visit: Payer: Self-pay

## 2020-06-05 ENCOUNTER — Ambulatory Visit (HOSPITAL_COMMUNITY): Payer: Self-pay | Admitting: Pharmacist

## 2020-06-05 ENCOUNTER — Ambulatory Visit (HOSPITAL_COMMUNITY)
Admission: RE | Admit: 2020-06-05 | Discharge: 2020-06-05 | Disposition: A | Discharge status: Home / Self Care | Payer: No Typology Code available for payment source | Source: Ambulatory Visit | Attending: Cardiology | Admitting: Cardiology

## 2020-06-05 DIAGNOSIS — Z95811 Presence of heart assist device: Secondary | ICD-10-CM | POA: Insufficient documentation

## 2020-06-05 DIAGNOSIS — R531 Weakness: Secondary | ICD-10-CM | POA: Diagnosis present

## 2020-06-05 DIAGNOSIS — Z4801 Encounter for change or removal of surgical wound dressing: Secondary | ICD-10-CM | POA: Insufficient documentation

## 2020-06-05 DIAGNOSIS — Z7901 Long term (current) use of anticoagulants: Secondary | ICD-10-CM

## 2020-06-05 LAB — PROTIME-INR
INR: 2.4 — ABNORMAL HIGH (ref 0.8–1.2)
Prothrombin Time: 25 seconds — ABNORMAL HIGH (ref 11.4–15.2)

## 2020-06-05 MED ORDER — HYDRALAZINE HCL 50 MG PO TABS: 25.0000 mg | ORAL_TABLET | Freq: Three times a day (TID) | ORAL | 5 refills | Status: DC

## 2020-06-05 MED ORDER — PANTOPRAZOLE SODIUM 40 MG PO TBEC: 40.0000 mg | DELAYED_RELEASE_TABLET | Freq: Every day | ORAL | 6 refills | Status: DC

## 2020-06-05 NOTE — Addendum Note (Signed)
Encounter addended by: Lebron Quam, RN on: 06/05/2020 10:21 AM  Actions taken: Order Reconciliation Section accessed

## 2020-06-05 NOTE — Progress Notes (Signed)
LVAD INR 

## 2020-06-05 NOTE — Progress Notes (Signed)
Patient presents for wound care in [facility] today with his wife. Reports no problems with VAD equipment or concerns with drive line. Denies any falls, dizziness, lightheadedness, or syncope.   Pt was hospitalized 3/9-3/24 for sternal wound and driveline debridement pt was on IV antibiotics but is now on Keflex 500 mg qid.  Pt states that he is not feeling well today. He reports weakness and decreased appetite. Pt refused being stuck again to check a CBC. D/W Dr. Shirlee Latch, will drop Hydralazine d/t pts BP being low today and having increased PI events.  See wound care notes below.  Vital Signs:  Doppler Pressure: 74 Automatc BP: 92/68 (76) HR: 97 SPO2: 100% RA  Weight: 200.2 lb w/o eqt Last weight: 194.2 lb  VAD Indication: Destination Therapy due to smoking status  VAD interrogation & Equipment Management: Speed: 5700 Flow:  4.6 Power:  4.3w    PI: 3.7 Hct: 36  Alarms: no clinical alarms Events: 100 + PI events for today Fixed speed: 5700 Low speed limit: 5400  Primary Controller:  Replace back up battery in 13 months. Back up controller:   Replace back up battery in 26 months.  Annual Equipment Maintenance on UBC/PM was performed on 07/2019.   I reviewed the LVAD parameters from today and compared the results to the patient's prior recorded data. LVAD interrogation was NEGATIVE for significant power changes, NEGATIVE for clinical alarms and NEGATIVE for PI events/speed drops. No programming changes were made and pump is functioning within specified parameters. Pt is performing daily controller and system monitor self tests along with completing weekly and monthly maintenance for LVAD equipment.  LVAD equipment check completed and is in good working order. Back-up equipment present.    Abdominal incision care: Abdominal incision with Mepilex dressing in place. Dressing removed. Small amount of serosanguinous drainage from lower portion of incision. Cleansed with  betadine. Opened area on the bottom of incision tunnels 2 cm.  Cellurate applied to wound. Packed with Ca Alginate strip and covered w/saline soaked 2 x 2 and Ca alginate strip, gauze and tape. Wife instructed to change every other day or as needed when saturated. Pt given 14 daily dressing kits, saline flushes, and adhesive remover. The top portion of the sternal incision was left OTA. D/W Stepahanie Dixon the culture from abdominal incision from last week. Will continue current treatment plan for now.     Exit Site Care: Drive line is being maintained daily by [name]. Gauze dressing with anchor intact and accurately applied.  Existing dressing removed using sterile technique. Site cleansed with betadine swab x 2 and allowed to dry. Cellerate applied to wound bed. Topped with saline moistened 2 x 2, mepitel and dry 4 x 4. Scant amount of thick, clear/yellow drainage noted. No foul odor or tenderness. Exit site with minimal tissue ingrown. Anchor changed. Continue daily dressing changes using saline moinstened 2 x 2. Covered with driveline dressing kit sponge. Continue Cellerate to wound bed every other day. Provided with 14 daily dressing kits, saline flushes, cotton tip applicators, and 2 x 2s for home use.      Device: N/A   BP & Labs:  Doppler 74 - Doppler is reflecting MAP   Hgb 11 - No S/S of bleeding. Specifically denies melena/BRBPR or nosebleeds.   LDH stable at 201 with established baseline of 150 - 220. Denies tea-colored urine. No power elevations noted on interrogation.    Patient Instructions: 1. Decrease Hydralazine 25 mg tid 2. Change abdominal dressing  every other day and driveline daily 3. Return to clinic in 1 week for BP check, wound care and full set of labs.   Tanda Rockers RN [place] Coordinator  Office: [phone]  24/7 Pager: [phone]

## 2020-06-05 NOTE — Addendum Note (Signed)
Encounter addended by: Lebron Quam, RN on: 06/05/2020 11:56 AM  Actions taken: Vitals modified, Pharmacy for encounter modified, Order list changed, Clinical Note Signed

## 2020-06-08 LAB — ACID FAST CULTURE WITH REFLEXED SENSITIVITIES (MYCOBACTERIA): Acid Fast Culture: NEGATIVE

## 2020-06-09 ENCOUNTER — Other Ambulatory Visit: Payer: Self-pay

## 2020-06-09 ENCOUNTER — Other Ambulatory Visit (HOSPITAL_COMMUNITY): Payer: Self-pay | Admitting: Unknown Physician Specialty

## 2020-06-09 ENCOUNTER — Ambulatory Visit (HOSPITAL_COMMUNITY)
Admission: RE | Admit: 2020-06-09 | Discharge: 2020-06-09 | Disposition: A | Discharge status: Home / Self Care | Payer: No Typology Code available for payment source | Source: Ambulatory Visit | Attending: Cardiology | Admitting: Cardiology

## 2020-06-09 ENCOUNTER — Inpatient Hospital Stay (HOSPITAL_COMMUNITY)
Admission: AD | Admit: 2020-06-09 | Discharge: 2020-06-18 | DRG: 378 | Disposition: A | Discharge status: Home / Self Care | Payer: No Typology Code available for payment source | Source: Ambulatory Visit | Attending: Cardiology | Admitting: Cardiology

## 2020-06-09 ENCOUNTER — Inpatient Hospital Stay (HOSPITAL_COMMUNITY): Payer: No Typology Code available for payment source

## 2020-06-09 VITALS — BP 70/50 | HR 85

## 2020-06-09 DIAGNOSIS — K5521 Angiodysplasia of colon with hemorrhage: Secondary | ICD-10-CM | POA: Diagnosis not present

## 2020-06-09 DIAGNOSIS — Z95811 Presence of heart assist device: Secondary | ICD-10-CM | POA: Diagnosis not present

## 2020-06-09 DIAGNOSIS — E119 Type 2 diabetes mellitus without complications: Secondary | ICD-10-CM

## 2020-06-09 DIAGNOSIS — Z86718 Personal history of other venous thrombosis and embolism: Secondary | ICD-10-CM

## 2020-06-09 DIAGNOSIS — T827XXA Infection and inflammatory reaction due to other cardiac and vascular devices, implants and grafts, initial encounter: Secondary | ICD-10-CM | POA: Diagnosis present

## 2020-06-09 DIAGNOSIS — F17210 Nicotine dependence, cigarettes, uncomplicated: Secondary | ICD-10-CM | POA: Diagnosis present

## 2020-06-09 DIAGNOSIS — Z86010 Personal history of colonic polyps: Secondary | ICD-10-CM

## 2020-06-09 DIAGNOSIS — T829XXD Unspecified complication of cardiac and vascular prosthetic device, implant and graft, subsequent encounter: Secondary | ICD-10-CM

## 2020-06-09 DIAGNOSIS — G4730 Sleep apnea, unspecified: Secondary | ICD-10-CM | POA: Diagnosis present

## 2020-06-09 DIAGNOSIS — L02213 Cutaneous abscess of chest wall: Secondary | ICD-10-CM | POA: Diagnosis present

## 2020-06-09 DIAGNOSIS — I5022 Chronic systolic (congestive) heart failure: Secondary | ICD-10-CM | POA: Diagnosis present

## 2020-06-09 DIAGNOSIS — Z792 Long term (current) use of antibiotics: Secondary | ICD-10-CM | POA: Diagnosis not present

## 2020-06-09 DIAGNOSIS — I255 Ischemic cardiomyopathy: Secondary | ICD-10-CM

## 2020-06-09 DIAGNOSIS — I420 Dilated cardiomyopathy: Secondary | ICD-10-CM | POA: Diagnosis present

## 2020-06-09 DIAGNOSIS — Y831 Surgical operation with implant of artificial internal device as the cause of abnormal reaction of the patient, or of later complication, without mention of misadventure at the time of the procedure: Secondary | ICD-10-CM | POA: Diagnosis present

## 2020-06-09 DIAGNOSIS — K921 Melena: Secondary | ICD-10-CM | POA: Diagnosis present

## 2020-06-09 DIAGNOSIS — G4733 Obstructive sleep apnea (adult) (pediatric): Secondary | ICD-10-CM | POA: Diagnosis present

## 2020-06-09 DIAGNOSIS — D689 Coagulation defect, unspecified: Secondary | ICD-10-CM | POA: Diagnosis present

## 2020-06-09 DIAGNOSIS — I110 Hypertensive heart disease with heart failure: Secondary | ICD-10-CM | POA: Diagnosis present

## 2020-06-09 DIAGNOSIS — Z8711 Personal history of peptic ulcer disease: Secondary | ICD-10-CM

## 2020-06-09 DIAGNOSIS — K922 Gastrointestinal hemorrhage, unspecified: Secondary | ICD-10-CM | POA: Diagnosis not present

## 2020-06-09 DIAGNOSIS — D62 Acute posthemorrhagic anemia: Secondary | ICD-10-CM | POA: Diagnosis present

## 2020-06-09 DIAGNOSIS — Z7901 Long term (current) use of anticoagulants: Secondary | ICD-10-CM

## 2020-06-09 DIAGNOSIS — E861 Hypovolemia: Secondary | ICD-10-CM | POA: Diagnosis present

## 2020-06-09 DIAGNOSIS — A4901 Methicillin susceptible Staphylococcus aureus infection, unspecified site: Secondary | ICD-10-CM

## 2020-06-09 DIAGNOSIS — E785 Hyperlipidemia, unspecified: Secondary | ICD-10-CM | POA: Diagnosis present

## 2020-06-09 DIAGNOSIS — K31811 Angiodysplasia of stomach and duodenum with bleeding: Secondary | ICD-10-CM | POA: Diagnosis present

## 2020-06-09 DIAGNOSIS — I959 Hypotension, unspecified: Secondary | ICD-10-CM | POA: Diagnosis present

## 2020-06-09 DIAGNOSIS — Z20822 Contact with and (suspected) exposure to covid-19: Secondary | ICD-10-CM | POA: Diagnosis present

## 2020-06-09 LAB — HEPATIC FUNCTION PANEL
ALT: 9 U/L (ref 0–44)
AST: 17 U/L (ref 15–41)
Albumin: 3.4 g/dL — ABNORMAL LOW (ref 3.5–5.0)
Alkaline Phosphatase: 74 U/L (ref 38–126)
Bilirubin, Direct: 0.2 mg/dL (ref 0.0–0.2)
Indirect Bilirubin: 0.5 mg/dL (ref 0.3–0.9)
Total Bilirubin: 0.7 mg/dL (ref 0.3–1.2)
Total Protein: 6.5 g/dL (ref 6.5–8.1)

## 2020-06-09 LAB — CBC
HCT: 16 % — ABNORMAL LOW (ref 39.0–52.0)
HCT: 21.1 % — ABNORMAL LOW (ref 39.0–52.0)
Hemoglobin: 4.5 g/dL — CL (ref 13.0–17.0)
Hemoglobin: 6.7 g/dL — CL (ref 13.0–17.0)
MCH: 26.9 pg (ref 26.0–34.0)
MCH: 28.5 pg (ref 26.0–34.0)
MCHC: 28.1 g/dL — ABNORMAL LOW (ref 30.0–36.0)
MCHC: 31.8 g/dL (ref 30.0–36.0)
MCV: 95.8 fL (ref 80.0–100.0)
MCV: 89.8 fL (ref 80.0–100.0)
Platelets: 320 10*3/uL (ref 150–400)
Platelets: 231 10*3/uL (ref 150–400)
RBC: 1.67 MIL/uL — ABNORMAL LOW (ref 4.22–5.81)
RBC: 2.35 MIL/uL — ABNORMAL LOW (ref 4.22–5.81)
RDW: 17.1 % — ABNORMAL HIGH (ref 11.5–15.5)
RDW: 15.6 % — ABNORMAL HIGH (ref 11.5–15.5)
WBC: 11.8 10*3/uL — ABNORMAL HIGH (ref 4.0–10.5)
WBC: 11.6 10*3/uL — ABNORMAL HIGH (ref 4.0–10.5)
nRBC: 0 % (ref 0.0–0.2)
nRBC: 0 % (ref 0.0–0.2)

## 2020-06-09 LAB — BASIC METABOLIC PANEL
Anion gap: 14 (ref 5–15)
BUN: 27 mg/dL — ABNORMAL HIGH (ref 8–23)
CO2: 15 mmol/L — ABNORMAL LOW (ref 22–32)
Calcium: 8.9 mg/dL (ref 8.9–10.3)
Chloride: 107 mmol/L (ref 98–111)
Creatinine, Ser: 1.18 mg/dL (ref 0.61–1.24)
GFR calc Af Amer: >60 mL/min (ref >60)
GFR calc non Af Amer: >60 mL/min (ref >60)
Glucose, Bld: 156 mg/dL — ABNORMAL HIGH (ref 70–99)
Potassium: 4.2 mmol/L (ref 3.5–5.1)
Sodium: 136 mmol/L (ref 135–145)

## 2020-06-09 LAB — MRSA PCR SCREENING: MRSA by PCR: NEGATIVE

## 2020-06-09 LAB — IRON AND TIBC
Iron: 22 ug/dL — ABNORMAL LOW (ref 45–182)
Saturation Ratios: 5 % — ABNORMAL LOW (ref 17.9–39.5)
TIBC: 413 ug/dL (ref 250–450)
UIBC: 391 ug/dL

## 2020-06-09 LAB — SARS CORONAVIRUS 2 BY RT PCR (HOSPITAL ORDER, PERFORMED IN ~~LOC~~ HOSPITAL LAB): SARS Coronavirus 2: NEGATIVE

## 2020-06-09 LAB — LACTATE DEHYDROGENASE
LDH: 142 U/L (ref 98–192)
LDH: 131 U/L (ref 98–192)

## 2020-06-09 LAB — PROTIME-INR
INR: 3.2 — ABNORMAL HIGH (ref 0.8–1.2)
Prothrombin Time: 31.8 seconds — ABNORMAL HIGH (ref 11.4–15.2)

## 2020-06-09 LAB — PREPARE RBC (CROSSMATCH)

## 2020-06-09 MED ORDER — ACETAMINOPHEN 500 MG PO TABS: 1000.0000 mg | ORAL_TABLET | Freq: Four times a day (QID) | ORAL | Status: DC | PRN

## 2020-06-09 MED ORDER — ZINC GLUCONATE 100 MG PO TABS: 100.0000 mg | ORAL_TABLET | Freq: Every day | ORAL | Status: DC

## 2020-06-09 MED ORDER — PANTOPRAZOLE SODIUM 40 MG PO TBEC: 40.0000 mg | DELAYED_RELEASE_TABLET | Freq: Every day | ORAL | Status: DC

## 2020-06-09 MED ORDER — OXYCODONE HCL 5 MG PO TABS: 5.0000 mg | ORAL_TABLET | ORAL | Status: DC | PRN

## 2020-06-09 MED ORDER — CEPHALEXIN 500 MG PO CAPS
500.0000 mg | ORAL_CAPSULE | Freq: Four times a day (QID) | ORAL | Status: DC
  Administered 2020-06-09 – 2020-06-13 (×14): 500 mg via ORAL
  Filled 2020-06-09 (×18): qty 1

## 2020-06-09 MED ORDER — GABAPENTIN 300 MG PO CAPS
600.0000 mg | ORAL_CAPSULE | Freq: Four times a day (QID) | ORAL | Status: DC
  Administered 2020-06-09 – 2020-06-18 (×33): 600 mg via ORAL
  Filled 2020-06-09 (×33): qty 2

## 2020-06-09 MED ORDER — VARENICLINE TARTRATE 1 MG PO TABS
1.0000 mg | ORAL_TABLET | Freq: Every day | ORAL | Status: DC
  Filled 2020-06-09 (×3): qty 1

## 2020-06-09 MED ORDER — SODIUM CHLORIDE 0.9% IV SOLUTION: Freq: Once | INTRAVENOUS | Status: DC

## 2020-06-09 MED ORDER — PANTOPRAZOLE SODIUM 40 MG IV SOLR
40.0000 mg | Freq: Two times a day (BID) | INTRAVENOUS | Status: DC
  Administered 2020-06-09 – 2020-06-18 (×17): 40 mg via INTRAVENOUS
  Filled 2020-06-09 (×18): qty 40

## 2020-06-09 MED ORDER — SODIUM CHLORIDE 0.9% IV SOLUTION: Freq: Once | INTRAVENOUS | Status: AC

## 2020-06-09 MED ORDER — ONDANSETRON HCL 4 MG/2ML IJ SOLN: 4.0000 mg | Freq: Four times a day (QID) | INTRAMUSCULAR | Status: DC | PRN

## 2020-06-09 MED ORDER — THIAMINE HCL 100 MG PO TABS
100.0000 mg | ORAL_TABLET | Freq: Every day | ORAL | Status: DC
  Administered 2020-06-10 – 2020-06-18 (×8): 100 mg via ORAL
  Filled 2020-06-09 (×9): qty 1

## 2020-06-09 MED ORDER — ZOLPIDEM TARTRATE 5 MG PO TABS
5.0000 mg | ORAL_TABLET | Freq: Every day | ORAL | Status: DC
  Administered 2020-06-09 – 2020-06-17 (×9): 5 mg via ORAL
  Filled 2020-06-09 (×9): qty 1

## 2020-06-09 NOTE — Progress Notes (Signed)
ANTICOAGULATION CONSULT NOTE - Follow Up Consult  Pharmacy Consult for Warfarin  Indication: LVAD  Allergies  Allergen Reactions  . Pineapple Hives  . Chlorhexidine Rash  . Other Rash    Prep pads    Patient Measurements:     Vital Signs: Temp: 98.7 F (37.1 C) (07/23 1506) Temp Source: Oral (07/23 1506) BP: 70/50 (07/23 1352) Pulse Rate: 69 (07/23 1506)  Labs: Recent Labs    06/09/20 1047  HGB 4.5*  HCT 16.0*  PLT 320  LABPROT 31.8*  INR 3.2*  CREATININE 1.18    Estimated Creatinine Clearance: 75.8 mL/min (by C-G formula based on SCr of 1.18 mg/dL).   Assessment: 59yom with HF s/p LVAD HM3 placed 9/19 admitted with low Hgb4.5, INR 3.2 - increased for 2.4 on 7/19.   Hold warfarin and replace prbc.   MAPs 70s hold PTA entresto and spiro for now, Cr stable    Goal of Therapy:  2-2.5  Monitor platelets by anticoagulation protocol: Yes   Plan:  Hold warfarin Replace PRBC F/u GI plan  [name] Pharm.D. CPP, BCPS Clinical Pharmacist [phone] 06/09/2020 3:57 PM

## 2020-06-09 NOTE — Progress Notes (Signed)
Patient presents for sick visit in [facility] today with his wife. Reports no problems with VAD equipment or concerns with drive line.  Pt was hospitalized 3/9-3/24 for sternal wound and driveline debridement pt was on IV antibiotics but is now on Keflex 500 mg qid.  Pts wife called the office this morning stating that the pt is still not feeling well. He reports weakness and decreased appetite. Pt is unable to walk across the floor without being SOB. Denies fever, but states he has been having a lot chills.   Pt arrived to clinic in [facility]. BP 85/65 (75). BC drawn, 20g PIV started in LFA and NS bolus started. BC drawn x 2.   See wound care notes below.  Vital Signs:  Doppler Pressure: 74 Automatc BP: 85/65 (75) HR: 85 SPO2: 100% RA  Weight: 200.2 lb w/o eqt Last weight: 194.2 lb  VAD Indication: Destination Therapy due to smoking status  VAD interrogation & Equipment Management: Speed: 5700 Flow:  4.3 Power:  4.4w    PI: 4.4 Hct: 36  Alarms: no clinical alarms Events: 70 + PI events for today Fixed speed: 5700 Low speed limit: 5400  Primary Controller:  Replace back up battery in 13 months. Back up controller:   Replace back up battery in 26 months.  Annual Equipment Maintenance on UBC/PM was performed on 07/2019.   I reviewed the LVAD parameters from today and compared the results to the patient's prior recorded data. LVAD interrogation was NEGATIVE for significant power changes, NEGATIVE for clinical alarms and NEGATIVE for PI events/speed drops. No programming changes were made and pump is functioning within specified parameters. Pt is performing daily controller and system monitor self tests along with completing weekly and monthly maintenance for LVAD equipment.  LVAD equipment check completed and is in good working order. Back-up equipment present.    Abdominal incision care: Abdominal incision with Mepilex dressing in place. Dressing removed. Small amount of  serosanguinous drainage from lower portion of incision and now there is an open area on the top of the incision, this area was cultured. Cleansed with betadine. Opened area on the bottom of incision tunnels 2 cm.  Cellurate applied to wound. Packed with Ca Alginate strip and covered w/saline soaked 2 x 2 and Ca alginate strip, gauze and tape. Wife instructed to change every other day or as needed when saturated.       Exit Site Care: Drive line is being maintained daily by [employer]. Gauze dressing with anchor intact and accurately applied.  Existing dressing removed using sterile technique. Site cleansed with betadine swab x 2 and allowed to dry. Cellerate applied to wound bed. Topped with saline moistened 2 x 2, mepitel and dry 4 x 4. Scant amount of thick, clear/yellow drainage noted. No foul odor or tenderness. Exit site with minimal tissue ingrown. Anchor changed. Every other day dressing changes using saline moinstened 2 x 2. Covered with driveline dressing kit sponge.      Device: N/A   BP & Labs:  Doppler 74 - Doppler is reflecting MAP   Hgb 4.5 - No S/S of bleeding. Pt states he had a black stool on Tuesday but    LDH stable at 142 with established baseline of 150 - 220. Denies tea-colored urine. No power elevations noted on interrogation.    Patient Instructions: 1. Admit for GI bleed   Tanda Rockers RN [place] Coordinator  Office: [phone]  24/7 Pager: [phone]

## 2020-06-09 NOTE — Consult Note (Addendum)
Consultation  Referring Provider:  Cardiology / Jearld Pies Primary Care Physician:  [name], MD Primary Gastroenterologist:   [facility]  Reason for Consultation:   GI bleed  HPI: Theodore Welch is a 59 y.o. male, being admitted to the hospital this afternoon after being seen in the heart failure clinic this morning.  He had presented complaining of weakness and chills and was found to be hypotensive with blood pressure 85/65.  He has been on Keflex for a cellulitis.  Labs were drawn, and returned showing WBC of 11.8, hemoglobin 4.5/hematocrit 16, platelets 320.  BUN 27/creatinine 1.18 and INR of 3.2. Patient is on Coumadin and aspirin and has history of chronic congestive heart failure/nonischemic cardiomyopathy and is status post LVAD placement September 2019.  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 Patient is maintained on Protonix 40 mg p.o. daily. EUS was done through [facility] in 2018 for a prominent ampulla, no reports or biopsies available. Patient relates that he has had prior colonoscopies through the [facility] in [place], and keeps up-to-date with those. He believes that he has had some small polyps in the past but was told that everything looked good at the time of last colonoscopy which was about 4 years ago. He does not think that he has had prior endoscopy. Patient has had a difficult course over the past year and a half.  He was admitted in January 2020 for a driveline infection, then readmitted in May 2020 for MSSA infection.  He required admission in March 2021 with subxiphoid abscess and cellulitis, and required debridement and then wound VAC.  He was admitted in June 2021 with bleeding from his sternal/subxiphoid wound.  He did require multiple transfusions at that time.  Currently has a PICC  line in. Reviewing labs in June 2021 hemoglobin in the 8.2-10 range, hemoglobin on 05/29/2020 was 11 hematocrit of 36.3. Patient denies any recent issues with abdominal pain or changes in bowel habits. No heartburn or indigestion, no dysphagia, no nausea or vomiting. He says he had a small black stool this past Saturday, Sunday had a normal bowel movement, then on Tuesday he had a black bowel movement and has not had any bowel movement since. He says he is usually very regular so this was somewhat concerning to him. He feels that his abdominal wounds are continuing to improve and he has not had any bleeding from the wounds at home over the past couple of weeks. He has been stable since arrival to the hospital after 1 L bolus.  CT of the abdomen and pelvis has been done stat this afternoon to rule out retroperitoneal  Hematoma/abscess. This shows a small amount of air in the anterior abdominal wall to the right of the umbilicus without definitive hematoma or fluid collection the wound is open to the skin site. Colon shows no obstruction or inflammatory changes mild diverticular changes small bowel and stomach appear normal. No focal liver abnormality noted   Past Medical History:  Diagnosis Date  . 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 Cardiomyopathy, unspecified (HCC)   . CHF (congestive heart failure) (HCC)   . Chronic back pain   . Diabetes mellitus without complication (HCC)   . Enlarged heart   . Gastric ulcer   . Gastroenteritis   . H/O degenerative disc disease   .  Hypertension   . LVAD (left ventricular assist device) present [facility])     Past Surgical History:  Procedure Laterality Date  . APPLICATION OF A-CELL OF CHEST/ABDOMEN N/A 04/27/2020   Procedure: APPLICATION OF A-CELL OF CHEST;  Surgeon: Peggye Form, DO;  Location: [facility] OR;  Service: Plastics;  Laterality: N/A;  . APPLICATION OF WOUND VAC N/A 12/03/2018   Procedure: APPLICATION OF WOUND VAC;  Surgeon: Donata Clay, Theron Arista,  MD;  Location: [facility] OR;  Service: Thoracic;  Laterality: N/A;  . APPLICATION OF WOUND VAC N/A 12/09/2018   Procedure: Removal of Wound Vac, Application of ACELL;  Surgeon: Kerin Perna, MD;  Location: [facility] OR;  Service: Thoracic;  Laterality: N/A;  . APPLICATION OF WOUND VAC N/A 01/26/2020   Procedure: APPLICATION OF WOUND VAC;  Surgeon: Kerin Perna, MD;  Location: [facility] OR;  Service: Thoracic;  Laterality: N/A;  . APPLICATION OF WOUND VAC N/A 01/31/2020   Procedure: WOUND VAC CHANGE;  Surgeon: Kerin Perna, MD;  Location: [facility] OR;  Service: Thoracic;  Laterality: N/A;  . APPLICATION OF WOUND VAC N/A 04/18/2020   Procedure: Application Of Wound Vac;  Surgeon: Kerin Perna, MD;  Location: [facility] OR;  Service: Vascular;  Laterality: N/A;  . APPLICATION OF WOUND VAC N/A 04/21/2020   Procedure: WOUND VAC CHANGE;  Surgeon: Kerin Perna, MD;  Location: [facility] OR;  Service: Thoracic;  Laterality: N/A;  . APPLICATION OF WOUND VAC N/A 04/27/2020   Procedure: APPLICATION OF PREVENA INCISIONAL WOUND VAC;  Surgeon: Peggye Form, DO;  Location: [facility] OR;  Service: Plastics;  Laterality: N/A;  . INSERTION OF IMPLANTABLE LEFT VENTRICULAR ASSIST DEVICE N/A 08/03/2018   Procedure: INSERTION OF IMPLANTABLE LEFT VENTRICULAR ASSIST DEVICE - HM3;  Surgeon: Alleen Borne, MD;  Location: [facility] OR;  Service: Open Heart Surgery;  Laterality: N/A;  HM3  . LUMBAR LAMINECTOMY/DECOMPRESSION MICRODISCECTOMY    . ORCHIECTOMY    . PECTORALIS FLAP N/A 04/27/2020   Procedure: MUSCLE FLAP TO STERNUM;  Surgeon: Peggye Form, DO;  Location: [facility] OR;  Service: Plastics;  Laterality: N/A;  . RIGHT HEART CATH N/A 07/24/2018   Procedure: RIGHT HEART CATH;  Surgeon: Laurey Morale, MD;  Location: [facility] INVASIVE CV LAB;  Service: Cardiovascular;  Laterality: N/A;  . STERNAL WOUND DEBRIDEMENT N/A 12/03/2018   Procedure: WOUND DEBRIDEMENT WITH A-CELL;  Surgeon: Kerin Perna, MD;  Location: [facility] OR;  Service: Thoracic;  Laterality: N/A;    . STERNAL WOUND DEBRIDEMENT N/A 01/26/2020   Procedure: STERNAL WOUND & driveline IRRIGATION AND DEBRIDEMENT;  Surgeon: Kerin Perna, MD;  Location: [facility] OR;  Service: Thoracic;  Laterality: N/A;  . STERNAL WOUND DEBRIDEMENT N/A 01/31/2020   Procedure: STERNAL WOUND IRRIGATION;  Surgeon: Kerin Perna, MD;  Location: [facility] OR;  Service: Thoracic;  Laterality: N/A;  MESONIC JET  . STERNAL WOUND DEBRIDEMENT N/A 02/04/2020   Procedure: STERNAL WOUND DEBRIDEMENT WITH WOUND VAC CHANGE;  Surgeon: Kerin Perna, MD;  Location: [facility] OR;  Service: Thoracic;  Laterality: N/A;  . STERNAL WOUND DEBRIDEMENT N/A 04/27/2020   Procedure: STERNAL WOUND DEBRIDEMENT;  Surgeon: Kerin Perna, MD;  Location: [facility] OR;  Service: Thoracic;  Laterality: N/A;  . TEE WITHOUT CARDIOVERSION N/A 08/03/2018   Procedure: TRANSESOPHAGEAL ECHOCARDIOGRAM (TEE);  Surgeon: Alleen Borne, MD;  Location: [facility] OR;  Service: Open Heart Surgery;  Laterality: N/A;  . WOUND DEBRIDEMENT  04/18/2020   Procedure: Debridement Abdominal Wound;  Surgeon: Kerin Perna, MD;  Location: [facility] OR;  Service: Vascular;;  . WOUND EXPLORATION N/A 04/18/2020   Procedure: REPAIR OF LVAD;  Surgeon: Kerin Perna, MD;  Location: [facility] OR;  Service: Vascular;  Laterality: N/A;    Prior to Admission medications   Medication Sig Start Date End Date Taking? Authorizing Provider  acetaminophen (TYLENOL) 500 MG tablet Take 1,000 mg by mouth every 6 (six) hours as needed for mild pain.    [provider]  aspirin EC 81 MG tablet Take 81 mg by mouth daily.    [provider]  carbamide peroxide (DEBROX) 6.5 % OTIC solution Place 5 drops into both ears 3 (three) times daily. 12/10/18   Clegg, Amy D, NP  cephALEXin (KEFLEX) 500 MG capsule Take 1 capsule (500 mg total) by mouth 4 (four) times daily. 05/23/20   Blanchard Kelch, NP  cyanocobalamin 500 MCG tablet Take 1,000 mcg by mouth daily.     [provider]  docusate sodium (COLACE) 100  MG capsule Take 2 capsules (200 mg total) by mouth daily as needed for mild constipation. 08/12/18   Clegg, Amy D, NP  fluticasone (FLONASE) 50 MCG/ACT nasal spray Place 1 spray into both nostrils 2 (two) times daily as needed for allergies.     [provider]  gabapentin (NEURONTIN) 300 MG capsule Take 2 capsules (600 mg total) by mouth 4 (four) times daily. 03/28/20 05/29/20  Laurey Morale, MD  glucose blood (CONTOUR NEXT TEST) test strip Use as instructed 12/18/18   Laurey Morale, MD  hydrALAZINE (APRESOLINE) 50 MG tablet Take 0.5 tablets (25 mg total) by mouth 3 (three) times daily. 06/05/20   Laurey Morale, MD  hydrOXYzine (ATARAX/VISTARIL) 50 MG tablet Take 1 tablet (50 mg total) by mouth 3 (three) times daily as needed for itching. 05/23/20   Laurey Morale, MD  Ipratropium-Albuterol (COMBIVENT) 20-100 MCG/ACT AERS respimat Inhale 1 puff into the lungs every 6 (six) hours as needed for wheezing or shortness of breath.     [provider]  Lancets (FREESTYLE) lancets Use as instructed 12/18/18   Laurey Morale, MD  nystatin (MYCOSTATIN/NYSTOP) powder Apply 1 application topically 3 (three) times daily. 02/15/20   Laurey Morale, MD  oxyCODONE (OXY IR/ROXICODONE) 5 MG immediate release tablet Take 1-2 tablets (5-10 mg total) by mouth every 4 (four) hours as needed for moderate pain. 05/05/20   Clegg, Amy D, NP  pantoprazole (PROTONIX) 40 MG tablet Take 1 tablet (40 mg total) by mouth daily. 06/05/20   Laurey Morale, MD  sacubitril-valsartan (ENTRESTO) 49-51 MG Take 1 tablet by mouth 2 (two) times daily. 05/30/20   Laurey Morale, MD  sildenafil (VIAGRA) 100 MG tablet Take 50 mg by mouth daily as needed for erectile dysfunction.    [provider]  spironolactone (ALDACTONE) 25 MG tablet Take 1 tablet (25 mg total) by mouth daily. 02/09/20   Robbie Lis M, PA-C  thiamine 100 MG tablet Take 1 tablet (100 mg total) by mouth daily. 04/05/19   Alford Highland,  NP  traMADol (ULTRAM) 50 MG tablet Take 2 tablets (100 mg total) by mouth every 6 (six) hours as needed for severe pain. 02/24/20   Allayne Butcher, PA-C  varenicline (CHANTIX CONTINUING MONTH PAK) 1 MG tablet Take 1 mg by mouth daily.     [provider]  warfarin (COUMADIN) 5 MG tablet Take 7.5 mg (1.5 tab) MWF and 5 mg all other days or as directed by  [facility]. 02/16/20   Bensimhon, Bevelyn Buckles, MD  Zinc Gluconate 100 MG TABS Take 1 tablet (100 mg total) by mouth daily. 12/15/18   Kerin Perna, MD  zolpidem (AMBIEN) 5 MG tablet TAKE 1 TABLET BY MOUTH EVERY DAY AT BEDTIME 02/15/20   Laurey Morale, MD    No current facility-administered medications for this encounter.    Allergies as of 06/09/2020 - Review Complete 05/23/2020  Allergen Reaction Noted  . Chlorhexidine Rash 08/11/2019  . Other Rash 01/25/2020    No family history on file.  Social History   Socioeconomic History  . Marital status: Single    Spouse name: Not on file  . Number of children: 7  . Years of education: 9th  . Highest education level: 9th grade  Occupational History  . Occupation: disabled  Tobacco Use  . Smoking status: Current Every Day Smoker    Types: Cigarettes    Start date: 2003  . Smokeless tobacco: Never Used  . Tobacco comment: Nicoderm patch   Vaping Use  . Vaping Use: Never used  Substance and Sexual Activity  . Alcohol use: Not Currently  . Drug use: Not Currently  . Sexual activity: Not on file  Other Topics Concern  . Not on file  Social History Narrative  . Not on file   Social Determinants of Health   Financial Resource Strain:   . Difficulty of Paying Living Expenses:   Food Insecurity:   . Worried About Programme researcher, broadcasting/film/video in the Last Year:   . Barista in the Last Year:   Transportation Needs:   . Freight forwarder (Medical):   Marland Kitchen Lack of Transportation (Non-Medical):   Physical Activity:   . Days of Exercise per Week:   . Minutes of  Exercise per Session:   Stress:   . Feeling of Stress :   Social Connections:   . Frequency of Communication with Friends and Family:   . Frequency of Social Gatherings with Friends and Family:   . Attends Religious Services:   . Active Member of Clubs or Organizations:   . Attends Banker Meetings:   Marland Kitchen Marital Status:   Intimate Partner Violence:   . Fear of Current or Ex-Partner:   . Emotionally Abused:   Marland Kitchen Physically Abused:   . Sexually Abused:     Review of Systems: Gen: Denies any fever, chills, sweats, anorexia, fatigue, weakness, malaise, weight loss, and sleep disorder CV: Denies chest pain, angina, palpitations, syncope, orthopnea, PND, peripheral edema, and claudication. Resp: Denies dyspnea at rest, dyspnea with exercise, cough, sputum, wheezing, coughing up blood, and pleurisy. GI: Denies vomiting blood, jaundice, and fecal incontinence.   Denies dysphagia or odynophagia. GU : Denies urinary burning, blood in urine, urinary frequency, urinary hesitancy, nocturnal urination, and urinary incontinence. MS: Denies joint pain, limitation of movement, and swelling, stiffness, low back pain, extremity pain. Denies muscle weakness, cramps, atrophy.  Derm: Denies rash, itching, dry skin, hives, moles, warts, or unhealing ulcers.  Psych: Denies depression, anxiety, memory loss, suicidal ideation, hallucinations, paranoia, and confusion. Heme: Denies bruising, bleeding, and enlarged lymph nodes. Neuro:  Denies any headaches, dizziness, paresthesias. Endo:  Denies any problems with DM, thyroid, adrenal function.  Physical Exam: Vital signs in last 24 hours: Pulse Rate:  [85] 85 (07/23 1242) BP: (70)/(50) 70/50 (07/23 1242) SpO2:  [100 %] 100 % (07/23 1242)   General:   Alert,  Well-developed, well-nourished, older African-American male pleasant  and cooperative in NAD Head:  Normocephalic and atraumatic. Eyes:  Sclera clear, no icterus.   Conjunctiva pale Ears:   Normal auditory acuity. Nose:  No deformity, discharge,  or lesions. Mouth:  No deformity or lesions.   Neck:  Supple; no masses or thyromegaly. Lungs:  Clear throughout to auscultation.   No wheezes, crackles, or rhonchi. Heart: LVAD hum Abdomen:  Soft, protuberant, nontender no palpable mass or hepatosplenomegaly, several abdominal incisional scars, LVAD present in the left mid abdomen, he has 2 abdominal wounds which are covered. Rectal:  Deferred  Msk:  Symmetrical without gross deformities. . Pulses:  Normal pulses noted. Extremities:  Without clubbing or edema. Neurologic:  Alert and  oriented x4;  grossly normal neurologically. Skin:  Intact without significant lesions or rashes.. Psych:  Alert and cooperative. Normal mood and affect.  Intake/Output from previous day: No intake/output data recorded. Intake/Output this shift: No intake/output data recorded.  Lab Results: Recent Labs    06/09/20 1047  WBC 11.8*  HGB 4.5*  HCT 16.0*  PLT 320   BMET Recent Labs    06/09/20 1047  NA 136  K 4.2  CL 107  CO2 15*  GLUCOSE 156*  BUN 27*  CREATININE 1.18  CALCIUM 8.9   LFT Recent Labs    06/09/20 1048  PROT 6.5  ALBUMIN 3.4*  AST 17  ALT 9  ALKPHOS 74  BILITOT 0.7  BILIDIR 0.2  IBILI 0.5   PT/INR Recent Labs    06/09/20 1047  LABPROT 31.8*  INR 3.2*   Hepatitis Panel No results for input(s): HEPBSAG, HCVAB, HEPAIGM, HEPBIGM in the last 72 hours.   IMPRESSION:  #39 59 year old African-American male admitted with complaints of weakness over the past 24 to 48 hours, and found to have hemoglobin of 4.5. This is in the setting of severe heart failure, status post LVAD placement September 2019 and on chronic Coumadin and aspirin. Patient does report to black stools in the past week, no bowel movement over the past 3 days.  Hemoglobin 2 weeks ago was 11. Expect that he has had a subacute GI bleed over the past couple of weeks. We will need to rule out  peptic ulcer disease, chronic gastropathy, AVMs. Doubt lower GI bleed as would expect more obvious bleeding and patient on Coumadin aspirin with diverticular bleed etc.  Currently hemodynamically stable  #2 chronic anticoagulation-on Coumadin and aspirin, INR 3.2 #3 complicated history over the past 6 months with subxiphoid abscess and cellulitis, and admission in June 2021 for bleeding from the subxiphoid wound requiring multiple units of blood. #3 adult onset diabetes mellitus #4 cirrhosis, child's class a by prior reports felt congestive hepatopathy plus minus EtOH. Cirrhosis not mentioned on CT today and no varices mentioned on CT 5. Smoker 6. Obstructive sleep apnea 7. History of colon polyps-colonoscopies have been done through the [place] in [place], up-to-date with last colonoscopy 4 years ago  Plan; hold Coumadin and allow INR to drift. Will follow cardiology recommendations as to how low we can allow INR to get. Full liquid diet today. Have started IV PPI twice daily Transfuse to keep hemoglobin about 8. He will need EGD this admission, but as long as not actively hemorrhaging will allow INR to drift closer to 2-2.5 prior to proceeding with endoscopy.  Thank you will follow with you   Amy EsterwoodPA-C  06/09/2020, 12:51 PM  GI ATTENDING  History, laboratories, x-rays reviewed.  Agree with comprehensive consultation note as outlined above.  The  patient presents with weakness secondary to what appears to be an interval upper GI bleed based on history and change in hemoglobin.  Chronic anticoagulation as noted.  Multiple cardiovascular problems as noted.  CT scan does NOT show any evidence of retroperitoneal bleed or other hemorrhagic process.  Agree with plans for IV PPI, careful transfusion to desired hemoglobin, and plans for upper endoscopy when INR a bit lower-should therapeutics be required.  If he were to become unstable with recurrent acute bleeding, we would need to intervene  sooner.  We will continue to follow closely with you.  Thank you.  Wilhemina Bonito. Eda Keys., M.D. [facility] Division of Gastroenterology

## 2020-06-09 NOTE — H&P (Addendum)
Advanced Heart Failure VAD History and Physical Note   PCP-Cardiologist: Dr. Shirlee Latch   Reason for Admission: GIB   HPI:    59 y.o. with history of nonischemic cardiomyopathy, RLE DVT, cirrhosis, smoking, and OSA returns for followup of CHF/LVAD placement.  Cardiomyopathy was diagnosed in 6/19 in [place], [place] at that time showed low output.  He was admitted to [facility] in 9/19 with low output HF and was started on milrinone and diuresed.  Unable to wean off milrinone.  He had a degree of RV failure, but this improved on milrinone.  Valvular heart disease also looked better with milrinone and diuresis.  On 08/03/18, he had Heartmate 3 LVAD placed.  Speed was optimized by ramp echo post-op.  Post-op course was relatively unremarkable.  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  No abscess on CT.  He was started on cefazolin IV for 6 wks.  BP-active meds decreased with low MAP.   He was admitted in 3/21 with subxiphoid abscess and cellulitis.  CT showed collection along the course of the driveline.  There was no evidence for sternal osteomyelitis.  Abscess was debrided, MSSA grew from wound cultures.  Wound vac was placed.  Patient was started on cefazolin, which was recently stopped after completing 8 wks.   Patient was re-admitted later in 3/21 with facial Zoster.  He was treated with acyclovir and this has resolved.   He was admitted in 6/21 with bleeding from the site of the subxiphoid abscess as well as cellulitis.  He required multiple units of PRBCs.  He went to the OR initially for I&D.  Blood and wound cultures grew MSSA.  He then went back to the OR for rectus flap over wound site (plastics).  He was started on cefazolin and rifabutin, has PICC.   Seen in VAD clinic 7/19 and not feeling well. Complained of weakness and decreased appetite. BP was low and increased PI events on vad interrogation. He  refused being stuck again to check a CBC but labs the week prior showed stable h/o at 11, c/w his baseline. INR at visit on 7/19 was 2.4. Hydralazine was reduced to 25 mg tid.   He presents back to clinic today w/ worsening fatigue/ weakness and new exertional dyspnea, also w/ syncopal episode this morning, he "blacked out" in the car while his wife was driving him to his appt. He had melanotic stool x 1 3 days ago. No BM since that time. Has been constipated.   In clinic, found to be hypotensive w/ SBPs in the 70s and markedly anemic w/ Hgb of 4.2. INR 3.2. He denies low back, abdominal or flank pain. He was given 1L fluid bolus w/ improvement of SBP to 90. He is being admitted for symptomatic anemia and w/u for suspected GIB.    LVAD INTERROGATION:  HeartMate II LVAD:  Flow 4.3 liters/min, speed 5700, power 4.4, PI 4.4, 70 + PI events for today   Review of Systems: [y] = yes,  = no   General: Weight gain ; Weight loss ; Anorexia [ Y]; Fatigue [ Y]; Fever ; Chills ; Weakness [ Y]  Cardiac: Chest pain/pressure ; Resting SOB ; Exertional SOB [Y ]; Orthopnea ; Pedal Edema ; Palpitations ; Syncope [ Y]; Presyncope [ Y]; Paroxysmal nocturnal dyspnea[ ]   Pulmonary: Cough ; Wheezing[ ] ; Hemoptysis[ ] ;  Sputum ; Snoring   GI: Vomiting[ ] ; Dysphagia[ ] ; Melena[Y ]; Hematochezia ; Heartburn[ ] ; Abdominal pain ; Constipation [ Y]; Diarrhea ; BRBPR   GU: Hematuria[ ] ; Dysuria ; Nocturia[ ]   Vascular: Pain in legs with walking ; Pain in feet with lying flat ; Non-healing sores ; Stroke ; TIA ; Slurred speech ;  Neuro: Headaches[ ] ; Vertigo[ ] ; Seizures[ ] ; Paresthesias[ ] ;Blurred vision [Y ]; Diplopia ; Vision changes   Ortho/Skin: Arthritis ; Joint pain ; Muscle pain ; Joint swelling ; Back Pain ; Rash   Psych: Depression[ ] ; Anxiety[ ]   Heme: Bleeding problems [ Y]; Clotting disorders ; Anemia [ Y]    Endocrine: Diabetes ; Thyroid dysfunction[ ]     Home Medications Prior to Admission medications   Medication Sig Start Date End Date Taking? Authorizing Provider  acetaminophen (TYLENOL) 500 MG tablet Take 1,000 mg by mouth every 6 (six) hours as needed for mild pain.    [provider]  aspirin EC 81 MG tablet Take 81 mg by mouth daily.    [provider]  carbamide peroxide (DEBROX) 6.5 % OTIC solution Place 5 drops into both ears 3 (three) times daily. 12/10/18   Clegg, Amy D, NP  cephALEXin (KEFLEX) 500 MG capsule Take 1 capsule (500 mg total) by mouth 4 (four) times daily. 05/23/20   Blanchard Kelch, NP  cyanocobalamin 500 MCG tablet Take 1,000 mcg by mouth daily.     [provider]  docusate sodium (COLACE) 100 MG capsule Take 2 capsules (200 mg total) by mouth daily as needed for mild constipation. 08/12/18   Clegg, Amy D, NP  fluticasone (FLONASE) 50 MCG/ACT nasal spray Place 1 spray into both nostrils 2 (two) times daily as needed for allergies.     [provider]  gabapentin (NEURONTIN) 300 MG capsule Take 2 capsules (600 mg total) by mouth 4 (four) times daily. 03/28/20 05/29/20  Laurey Morale, MD  glucose blood (CONTOUR NEXT TEST) test strip Use as instructed 12/18/18   Laurey Morale, MD  hydrALAZINE (APRESOLINE) 50 MG tablet Take 0.5 tablets (25 mg total) by mouth 3 (three) times daily. 06/05/20   Laurey Morale, MD  hydrOXYzine (ATARAX/VISTARIL) 50 MG tablet Take 1 tablet (50 mg total) by mouth 3 (three) times daily as needed for itching. 05/23/20   Laurey Morale, MD  Ipratropium-Albuterol (COMBIVENT) 20-100 MCG/ACT AERS respimat Inhale 1 puff into the lungs every 6 (six) hours as needed for wheezing or shortness of breath.     [provider]  Lancets (FREESTYLE) lancets Use as instructed 12/18/18   Laurey Morale, MD  nystatin (MYCOSTATIN/NYSTOP) powder Apply 1 application topically 3 (three) times daily. 02/15/20   Laurey Morale, MD  oxyCODONE (OXY IR/ROXICODONE) 5 MG immediate release tablet Take 1-2 tablets (5-10 mg total) by mouth every 4 (four) hours as needed for moderate pain. 05/05/20   Clegg, Amy D, NP  pantoprazole (PROTONIX) 40 MG tablet Take 1 tablet (40 mg total) by mouth daily. 06/05/20   Laurey Morale, MD  sacubitril-valsartan (ENTRESTO) 49-51 MG Take 1 tablet by mouth 2 (two) times daily. 05/30/20   Laurey Morale, MD  sildenafil (VIAGRA) 100 MG tablet Take 50 mg by mouth daily as needed for erectile dysfunction.    [provider]  spironolactone (ALDACTONE) 25 MG  tablet Take 1 tablet (25 mg total) by mouth daily. 02/09/20   Robbie Lis M, PA-C  thiamine 100 MG tablet Take 1 tablet (100 mg total) by mouth daily. 04/05/19   Alford Highland, NP  traMADol (ULTRAM) 50 MG tablet Take 2 tablets (100 mg total) by mouth every 6 (six) hours as needed for severe pain. 02/24/20   Allayne Butcher, PA-C  varenicline (CHANTIX CONTINUING MONTH PAK) 1 MG tablet Take 1 mg by mouth daily.     [provider]  warfarin (COUMADIN) 5 MG tablet Take 7.5 mg (1.5 tab) MWF and 5 mg all other days or as directed by HF Clinic. 02/16/20   Bensimhon, Bevelyn Buckles, MD  Zinc Gluconate 100 MG TABS Take 1 tablet (100 mg total) by mouth daily. 12/15/18   Kerin Perna, MD  zolpidem (AMBIEN) 5 MG tablet TAKE 1 TABLET BY MOUTH EVERY DAY AT BEDTIME 02/15/20   Laurey Morale, MD    Past Medical History: Past Medical History:  Diagnosis Date  . 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 Cardiomyopathy, unspecified (HCC)   . CHF (congestive heart failure) (HCC)   . Chronic back pain   . Diabetes mellitus without complication (HCC)   . Enlarged heart   . Gastric ulcer   . Gastroenteritis   . H/O degenerative disc disease   . Hypertension   . LVAD (left ventricular assist device) present [facility])     Past Surgical History: Past Surgical History:  Procedure Laterality Date  . APPLICATION OF A-CELL OF  CHEST/ABDOMEN N/A 04/27/2020   Procedure: APPLICATION OF A-CELL OF CHEST;  Surgeon: Peggye Form, DO;  Location: [facility] OR;  Service: Plastics;  Laterality: N/A;  . APPLICATION OF WOUND VAC N/A 12/03/2018   Procedure: APPLICATION OF WOUND VAC;  Surgeon: Donata Clay, Theron Arista, MD;  Location: [facility] OR;  Service: Thoracic;  Laterality: N/A;  . APPLICATION OF WOUND VAC N/A 12/09/2018   Procedure: Removal of Wound Vac, Application of ACELL;  Surgeon: Kerin Perna, MD;  Location: [facility] OR;  Service: Thoracic;  Laterality: N/A;  . APPLICATION OF WOUND VAC N/A 01/26/2020   Procedure: APPLICATION OF WOUND VAC;  Surgeon: Kerin Perna, MD;  [facility] OR;  Service: Thoracic;  Laterality: N/A;  . APPLICATION OF WOUND VAC N/A 01/31/2020   Procedure: WOUND VAC CHANGE;  Surgeon: Kerin Perna, MD;  Location: [facility] OR;  Service: Thoracic;  Laterality: N/A;  . APPLICATION OF WOUND VAC N/A 04/18/2020   Procedure: Application Of Wound Vac;  Surgeon: Kerin Perna, MD;  Location: [facility] OR;  Service: Vascular;  Laterality: N/A;  . APPLICATION OF WOUND VAC N/A 04/21/2020   Procedure: WOUND VAC CHANGE;  Surgeon: Kerin Perna, MD;  Location: [facility] OR;  Service: Thoracic;  Laterality: N/A;  . APPLICATION OF WOUND VAC N/A 04/27/2020   Procedure: APPLICATION OF PREVENA INCISIONAL WOUND VAC;  Surgeon: Peggye Form, DO;  Location: [facility] OR;  Service: Plastics;  Laterality: N/A;  . INSERTION OF IMPLANTABLE LEFT VENTRICULAR ASSIST DEVICE N/A 08/03/2018   Procedure: INSERTION OF IMPLANTABLE LEFT VENTRICULAR ASSIST DEVICE - HM3;  Surgeon: Alleen Borne, MD;  Location: [facility] OR;  Service: Open Heart Surgery;  Laterality: N/A;  HM3  . LUMBAR LAMINECTOMY/DECOMPRESSION MICRODISCECTOMY    . ORCHIECTOMY    . PECTORALIS FLAP N/A 04/27/2020   Procedure: MUSCLE FLAP TO STERNUM;  Surgeon: Peggye Form, DO;  Location: [facility] OR;  Service: Plastics;  Laterality: N/A;  .  RIGHT HEART CATH N/A 07/24/2018   Procedure: RIGHT HEART CATH;   Surgeon: Laurey Morale, MD;  Location: [facility] INVASIVE CV LAB;  Service: Cardiovascular;  Laterality: N/A;  . STERNAL WOUND DEBRIDEMENT N/A 12/03/2018   Procedure: WOUND DEBRIDEMENT WITH A-CELL;  Surgeon: Kerin Perna, MD;  Location: [facility] OR;  Service: Thoracic;  Laterality: N/A;  . STERNAL WOUND DEBRIDEMENT N/A 01/26/2020   Procedure: STERNAL WOUND & driveline IRRIGATION AND DEBRIDEMENT;  Surgeon: Kerin Perna, MD;  Location: [facility] OR;  Service: Thoracic;  Laterality: N/A;  . STERNAL WOUND DEBRIDEMENT N/A 01/31/2020   Procedure: STERNAL WOUND IRRIGATION;  Surgeon: Kerin Perna, MD;  Location: [facility] OR;  Service: Thoracic;  Laterality: N/A;  MESONIC JET  . STERNAL WOUND DEBRIDEMENT N/A 02/04/2020   Procedure: STERNAL WOUND DEBRIDEMENT WITH WOUND VAC CHANGE;  Surgeon: Kerin Perna, MD;  Location: [facility] OR;  Service: Thoracic;  Laterality: N/A;  . STERNAL WOUND DEBRIDEMENT N/A 04/27/2020   Procedure: STERNAL WOUND DEBRIDEMENT;  Surgeon: Kerin Perna, MD;  Location: [facility] OR;  Service: Thoracic;  Laterality: N/A;  . TEE WITHOUT CARDIOVERSION N/A 08/03/2018   Procedure: TRANSESOPHAGEAL ECHOCARDIOGRAM (TEE);  Surgeon: Alleen Borne, MD;  Location: [facility] OR;  Service: Open Heart Surgery;  Laterality: N/A;  . WOUND DEBRIDEMENT  04/18/2020   Procedure: Debridement Abdominal Wound;  Surgeon: Kerin Perna, MD;  Location: [facility] OR;  Service: Vascular;;  . WOUND EXPLORATION N/A 04/18/2020   Procedure: REPAIR OF LVAD;  Surgeon: Kerin Perna, MD;  Location: [facility] OR;  Service: Vascular;  Laterality: N/A;    Family History: No family history on file.  Social History: Social History   Socioeconomic History  . Marital status: Single    Spouse name: Not on file  . Number of children: 7  . Years of education: 9th  . Highest education level: 9th grade  Occupational History  . Occupation: disabled  Tobacco Use  . Smoking status: Current Every Day Smoker    Types: Cigarettes    Start date: 2003  .  Smokeless tobacco: Never Used  . Tobacco comment: Nicoderm patch   Vaping Use  . Vaping Use: Never used  Substance and Sexual Activity  . Alcohol use: Not Currently  . Drug use: Not Currently  . Sexual activity: Not on file  Other Topics Concern  . Not on file  Social History Narrative  . Not on file   Social Determinants of Health   Financial Resource Strain:   . Difficulty of Paying Living Expenses:   Food Insecurity:   . Worried About Programme researcher, broadcasting/film/video in the Last Year:   . Barista in the Last Year:   Transportation Needs:   . Freight forwarder (Medical):   Marland Kitchen Lack of Transportation (Non-Medical):   Physical Activity:   . Days of Exercise per Week:   . Minutes of Exercise per Session:   Stress:   . Feeling of Stress :   Social Connections:   . Frequency of Communication with Friends and Family:   . Frequency of Social Gatherings with Friends and Family:   . Attends Religious Services:   . Active Member of Clubs or Organizations:   . Attends Banker Meetings:   Marland Kitchen Marital Status:     Allergies:  Allergies  Allergen Reactions  . Chlorhexidine Rash  . Other Rash    Prep pads    Objective:    Vital Signs:  Doppler Pressure: 74  Automatc BP: 85/65 (75) HR: 85 SPO2: 100% RA  Mean arterial Pressure 75   Physical Exam    General: fatigue/ weak appearing. No resp difficulty HEENT: Normal Neck: supple. No JVP .Carotids 2+ bilat; no bruits. No lymphadenopathy or thyromegaly appreciated. Cor: Mechanical heart sounds with LVAD hum present. Lungs: Clear Abdomen: soft, nontender, nondistended. No hepatosplenomegaly. No bruits or masses. Good bowel sounds. Driveline: C/D/I; securement device intact and driveline incorporated Extremities: no cyanosis, clubbing, rash, edema Neuro: alert & orientedx3, cranial nerves grossly intact. moves all 4 extremities w/o difficulty. Affect pleasant   Telemetry   NSR 80s   EKG   No new EKG to  review   Labs    Basic Metabolic Panel: Recent Labs  Lab 06/09/20 1047  NA 136  K 4.2  CL 107  CO2 15*  GLUCOSE 156*  BUN 27*  CREATININE 1.18  CALCIUM 8.9    Liver Function Tests: Recent Labs  Lab 06/09/20 1048  AST 17  ALT 9  ALKPHOS 74  BILITOT 0.7  PROT 6.5  ALBUMIN 3.4*   No results for input(s): LIPASE, AMYLASE in the last 168 hours. No results for input(s): AMMONIA in the last 168 hours.  CBC: Recent Labs  Lab 06/09/20 1047  WBC 11.8*  HGB 4.5*  HCT 16.0*  MCV 95.8  PLT 320    Cardiac Enzymes: No results for input(s): CKTOTAL, CKMB, CKMBINDEX, TROPONINI in the last 168 hours.  BNP: BNP (last 3 results) No results for input(s): BNP in the last 8760 hours.  ProBNP (last 3 results) No results for input(s): PROBNP in the last 8760 hours.   CBG: No results for input(s): GLUCAP in the last 168 hours.  Coagulation Studies: Recent Labs    06/09/20 1047  LABPROT 31.8*  INR 3.2*    Other results: EKG: obtaining EKG on admit   Imaging     No results found.    Assessment/Plan:    1. Anemia/ Suspected GIB - Hgb 4.5 (down from 11 7/12). INR 3.2  - suspect GIB given history of melanotic stool earlier this week x 1 - however given significant drop in hgb after just 1 episode of melena, will obtain CT of A/P to r/o RPB - BP improved after IVF bolus - Stop ASA - Hold Coumadin. Follow INR  - Transfuse x 2 units RBCs now. Repeat CBC post transfusion  - Obtain GI consult, will need EGD +/- colonoscopy  - IV Protonix  2. 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   - hold coumadin for active bleed/ supratherapeutic INR. - follow LDH  - Hold CHF/ BP active meds given hypotension/hypovolemia   - Should be transplant candidate eventually if he can quit smoking.  Will make transplant clinic referral when he is off totally.  3. 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   - He completed IV abx course w/ cefazolin via PICC and rifabutin.  Now on long-term Keflex for MSSA suppression.  - Dressing changed today. He has a small amount of serosanguinous drainage from lower portion of incision and now there is an open area on the top of the incision, this area was cultured. Will follow results.  - WBC 11.8. AF. - CT of C/A/P ordered to r/o complication with bleeding at wound flap site, will also assess for signs of worsening infection  - Continue wound care per Dr Maren Beach.  - Continue Keflex  4. Smoking: He has cut back a lot, still taking Chantix. Encouraged to quit.  5. H/o RLE DVT: Holding coumadin for GIB/anemia.  - SCDs for VTE prophylaxis   6. OSA: Continue CPAP. Continue nightly .  7. Hyperlipidemia: Atorvastatin.  8. Type II diabetes:  - SSI   I reviewed the LVAD parameters from today, and compared the results to the patient's prior recorded data.  No programming changes were made.  The LVAD is functioning within specified parameters.  The patient performs LVAD self-test daily.  LVAD interrogation was negative for any significant power changes, alarms or PI events/speed drops.  LVAD equipment check completed and is in good working order.  Back-up equipment present.   LVAD education done on emergency procedures and precautions and reviewed exit site care.  Length of Stay: 0  Robbie Lis, PA-C 06/09/2020, 12:14 PM  VAD Team Pager [phone] (7am - 7am) +++VAD ISSUES ONLY+++   Advanced Heart Failure Team Pager [phone] (M-F; 7a - 4p)  Please contact [facility] for night-coverage after hours (4p -7a ) and weekends on [email or web address] for all non- LVAD Issues  Patient seen with PA, agree with the above note.   Patient has been feeling poorly for about a week now.  He came in to the office today, MAP low in 60s.  Improved to 70s after 1 L IV fluid.  Hgb down to 4.5.  He does not have a history of GI bleeding.  He had an  episode of hematochezia earlier this week, has not had a BM since that time.  INR 3.2 today.    He is on chronic suppressive Keflex for subxiphoid abscess. He had a rectus muscle flap covering the resulting wound.  Still with drainage at rectus wound site.   General: Well appearing this am. NAD.  HEENT: Normal. Neck: Supple, JVP 7-8 cm. Carotids OK.  Cardiac:  Mechanical heart sounds with LVAD hum present.  Lungs:  CTAB, normal effort.  Abdomen:  NT, ND, no HSM. No bruits or masses. +BS  LVAD exit site: Well-healed and incorporated. Dressing dry and intact. No erythema or drainage. Stabilization device present and accurately applied. Driveline dressing changed daily per sterile technique. Extremities:  Warm and dry. No cyanosis, clubbing, rash, or edema.  Neuro:  Alert & oriented x 3. Cranial nerves grossly intact. Moves all 4 extremities w/o difficulty. Affect pleasant    Suspect GI bleeding related to ASA + warfarin, possible AVM given LVAD.   - Hold warfarin and stop ASA.  - Will transfuse 2 units PRBCs, CBC after transfusion.  - Hold Entresto, spironolactone, hydralazine with low BP.  - GI consult, will need endoscopy.  - IV PPI.  - Will get CT chest/abdomen/pelvis without contrast to rule out hemorrhage at wound site in chest/upper abdomen (think less likely than GI bleeding.   Continue Keflex for chronic wound infection.   Marca Ancona 06/09/2020 4:22 PM

## 2020-06-10 DIAGNOSIS — K922 Gastrointestinal hemorrhage, unspecified: Secondary | ICD-10-CM

## 2020-06-10 DIAGNOSIS — D689 Coagulation defect, unspecified: Secondary | ICD-10-CM

## 2020-06-10 DIAGNOSIS — Z7901 Long term (current) use of anticoagulants: Secondary | ICD-10-CM

## 2020-06-10 DIAGNOSIS — K921 Melena: Secondary | ICD-10-CM | POA: Diagnosis not present

## 2020-06-10 DIAGNOSIS — D62 Acute posthemorrhagic anemia: Secondary | ICD-10-CM

## 2020-06-10 DIAGNOSIS — I420 Dilated cardiomyopathy: Secondary | ICD-10-CM

## 2020-06-10 DIAGNOSIS — I5022 Chronic systolic (congestive) heart failure: Secondary | ICD-10-CM | POA: Diagnosis not present

## 2020-06-10 DIAGNOSIS — Z95811 Presence of heart assist device: Secondary | ICD-10-CM | POA: Diagnosis not present

## 2020-06-10 LAB — CBC
HCT: 24.2 % — ABNORMAL LOW (ref 39.0–52.0)
Hemoglobin: 7.5 g/dL — ABNORMAL LOW (ref 13.0–17.0)
MCH: 27.5 pg (ref 26.0–34.0)
MCHC: 31 g/dL (ref 30.0–36.0)
MCV: 88.6 fL (ref 80.0–100.0)
Platelets: 249 10*3/uL (ref 150–400)
RBC: 2.73 MIL/uL — ABNORMAL LOW (ref 4.22–5.81)
RDW: 15.6 % — ABNORMAL HIGH (ref 11.5–15.5)
WBC: 8.2 10*3/uL (ref 4.0–10.5)
nRBC: 0.2 % (ref 0.0–0.2)

## 2020-06-10 LAB — PROTIME-INR
INR: 2.7 — ABNORMAL HIGH (ref 0.8–1.2)
Prothrombin Time: 27.7 seconds — ABNORMAL HIGH (ref 11.4–15.2)

## 2020-06-10 LAB — AEROBIC CULTURE W GRAM STAIN (SUPERFICIAL SPECIMEN): Gram Stain: NONE SEEN

## 2020-06-10 LAB — BASIC METABOLIC PANEL
Anion gap: 8 (ref 5–15)
BUN: 19 mg/dL (ref 8–23)
CO2: 21 mmol/L — ABNORMAL LOW (ref 22–32)
Calcium: 9 mg/dL (ref 8.9–10.3)
Chloride: 110 mmol/L (ref 98–111)
Creatinine, Ser: 0.93 mg/dL (ref 0.61–1.24)
GFR calc Af Amer: >60 mL/min (ref >60)
GFR calc non Af Amer: >60 mL/min (ref >60)
Glucose, Bld: 116 mg/dL — ABNORMAL HIGH (ref 70–99)
Potassium: 4.3 mmol/L (ref 3.5–5.1)
Sodium: 139 mmol/L (ref 135–145)

## 2020-06-10 LAB — RETICULOCYTES
Immature Retic Fract: 41.4 % — ABNORMAL HIGH (ref 2.3–15.9)
Retic Count, Absolute: 157.3 10*3/uL (ref 19.0–186.0)
Retic Ct Pct: 10.6 % — ABNORMAL HIGH (ref 0.4–3.1)

## 2020-06-10 LAB — HAPTOGLOBIN: Haptoglobin: 131 mg/dL (ref 32–363)

## 2020-06-10 LAB — LACTATE DEHYDROGENASE: LDH: 146 U/L (ref 98–192)

## 2020-06-10 NOTE — Progress Notes (Signed)
ANTICOAGULATION CONSULT NOTE - Follow Up Consult  Pharmacy Consult for Warfarin  Indication: LVAD  Allergies  Allergen Reactions   Pineapple Hives   Chlorhexidine Rash   Other Rash    Prep pads    Patient Measurements: Weight: 85.5 kg (188 lb 7.9 oz)   Vital Signs: Temp: 97 F (36.1 C) (07/24 1111) Temp Source: Axillary (07/24 1111) BP: 103/80 (07/24 0800) Pulse Rate: 65 (07/24 1111)  Labs: Recent Labs    06/09/20 1047 06/09/20 1047 06/09/20 1925 06/10/20 0343  HGB 4.5*   < > 6.7* 7.5*  HCT 16.0*  --  21.1* 24.2*  PLT 320  --  231 249  LABPROT 31.8*  --   --  27.7*  INR 3.2*  --   --  2.7*  CREATININE 1.18  --   --  0.93   < > = values in this interval not displayed.    Estimated Creatinine Clearance: 88.8 mL/min (by C-G formula based on SCr of 0.93 mg/dL).   Assessment: 59yom with HF s/p LVAD HM3 placed 9/19 admitted with low Hgb4.5, INR 3.2 on admission - increased from 2.4 on 7/19.    Still having some melena overnight. Hgb now up to 7.5 after PRBCs. INR today is 2.7. Plt 294, LDH stable at 146.   Goal of Therapy:  2-2.5  Monitor platelets by anticoagulation protocol: Yes   Plan:  Hold warfarin Replace PRBC F/u GI plan  Sherron Monday, PharmD, BCCCP Clinical Pharmacist  Phone: [phone] 06/10/2020 12:07 PM  Please check AMION for all [facility] Pharmacy phone numbers After 10:00 PM, call Main Pharmacy [phone]

## 2020-06-10 NOTE — Progress Notes (Addendum)
Patient ID: Theodore Welch, male   DOB: 1961/09/01, 59 y.o.   MRN: [id number]    Progress Note   Subjective   Day # 2  CC; weakness, anemia  Patient up in chair, says he is feeling fine, he did have black stool x 1 again this morning.   Coumadin on hold-INR 2.7 today Hemoglobin 7.5 post 1 unit yesterday   Objective   Vital signs in last 24 hours: Temp:  [97 F (36.1 C)-99.1 F (37.3 C)] 98.6 F (37 C) (07/24 1523) Pulse Rate:  [44-88] 81 (07/24 1200) Resp:  [16-31] 26 (07/24 1200) BP: (78-114)/(54-88) 110/83 (07/24 1200) SpO2:  [98 %-100 %] 100 % (07/24 1200) Weight:  [85.5 kg] 85.5 kg (07/24 0600)   General: Older African-American male in NAD, pleasant Heart: LVAD hum Lungs: Respirations even and unlabored, lungs CTA bilaterally Abdomen:  Soft, nontender and nondistended. Normal bowel sounds. Extremities:  Without edema. Neurologic:  Alert and oriented,  grossly normal neurologically. Psych:  Cooperative. Normal mood and affect.  Intake/Output from previous day: 07/23 0701 - 07/24 0700 In: 1447.4 [P.O.:240; I.V.:81.7; Blood:1125.8] Out: 2175 [Urine:2175] Intake/Output this shift: No intake/output data recorded.  Lab Results: Recent Labs    06/09/20 1047 06/09/20 1925 06/10/20 0343  WBC 11.8* 11.6* 8.2  HGB 4.5* 6.7* 7.5*  HCT 16.0* 21.1* 24.2*  PLT 320 231 249   BMET Recent Labs    06/09/20 1047 06/10/20 0343  NA 136 139  K 4.2 4.3  CL 107 110  CO2 15* 21*  GLUCOSE 156* 116*  BUN 27* 19  CREATININE 1.18 0.93  CALCIUM 8.9 9.0   LFT Recent Labs    06/09/20 1048  PROT 6.5  ALBUMIN 3.4*  AST 17  ALT 9  ALKPHOS 74  BILITOT 0.7  BILIDIR 0.2  IBILI 0.5   PT/INR Recent Labs    06/09/20 1047 06/10/20 0343  LABPROT 31.8* 27.7*  INR 3.2* 2.7*    Studies/Results: CT ABDOMEN PELVIS WO CONTRAST  Result Date: 06/09/2020 CLINICAL DATA:  Decreased hemoglobin and weakness with black tarry stools, initial encounter EXAM: CT ABDOMEN AND PELVIS  WITHOUT CONTRAST TECHNIQUE: Multidetector CT imaging of the abdomen and pelvis was performed following the standard protocol without IV contrast. COMPARISON:  04/16/2020 FINDINGS: Lower chest: Minimal scarring is noted in the left lung base stable from the prior exam. LVAD is noted in the left chest. Previously seen soft tissue changes along the anterior chest wall are slightly more prominent and likely related to the sternal wound debridement and pectoralis flap. Hepatobiliary: No focal liver abnormality is seen. No gallstones, gallbladder wall thickening, or biliary dilatation. Pancreas: Unremarkable. No pancreatic ductal dilatation or surrounding inflammatory changes. Spleen: Normal in size without focal abnormality. Adrenals/Urinary Tract: Adrenal glands are within normal limits. Kidneys are well visualize without renal calculi. Stable right renal cyst is noted. No obstructive changes are seen. The bladder is well distended. Stomach/Bowel: Colon shows no obstructive or inflammatory changes. The appendix is within normal limits. No definitive colonic mass is seen. Mild diverticular changes noted without diverticulitis. The small bowel and stomach appear within normal limits. Vascular/Lymphatic: Aortic atherosclerosis. No enlarged abdominal or pelvic lymph nodes. Reproductive: Prostate is unremarkable. Other: No abdominal wall hernia or abnormality. No abdominopelvic ascites. Small amount of air is noted in the anterior abdominal wall just to the right of the umbilicus with some localized inflammatory change. This may be related to the recent surgery. No fluid collection is identified. Musculoskeletal: No acute or significant  osseous findings. IMPRESSION: LVAD in place without definitive complicating factors. Mild fluid attenuation in the anterior chest wall inferiorly increased from the prior exam likely related to the recent sternal surgery and reconstruction flap. Small amount of air in the anterior abdominal  wall to the right of the umbilicus without definitive hematoma or fluid collection. This may be related to the recent surgery. The wound is open to the skin site. No other focal abnormality is noted. Electronically Signed   By: Alcide Clever M.D.   On: 06/09/2020 14:53       Assessment / Plan:    #13 59 year old male admitted with weakness over the previous 48 hours and found to have hemoglobin of 4.5, in setting of severe heart failure status post LVAD placement September 2019 who is on Coumadin and aspirin. Patient had noted intermittent black stool over the past week  Presentation consistent with subacute GI bleed, rule out peptic ulcer disease, chronic gastropathy, AVMs, lower suspicion for lower GI source at present  He has been hemodynamically stable since admission, transfused x1.  #2 complicated recent history with subxiphoid abscess and cellulitis and admission in June 2021 for bleeding from the subxiphoid wound requiring multiple units of blood #3 adult onset diabetes mellitus 4 prior diagnosis of child's a cirrhosis,-CT on admit with no mention of cirrhosis. #5 history of colon polyps-up-to-date with colonoscopies done through the [place], last colonoscopy 4 years ago  Plan; allow regular diet today and tomorrow Continue to hold Coumadin and allow INR to drift IV PPI twice daily Transfuse to keep hemoglobin 7-8 Current plan is for upper endoscopy on Monday a.m.   Active Problems:   GIB (gastrointestinal bleeding)   LOS: 1 day   Amy EsterwoodPA-C  06/10/2020, 3:49 PM   GI ATTENDING  Interval history and data reviewed.  Patient personally seen and examined.  Agree with comprehensive interval progress note as outlined above.  Patient is sitting comfortably in his room.  No particular complaints.  He did report having had 1 dark stool.  Plans for upper endoscopy/enteroscopy Monday morning with INR at or below 2.  Patient is high risk.  Discussed with Dr. Gala Romney and the patient.   We will continue to follow.  Wilhemina Bonito. Eda Keys., M.D. [facility] Division of Gastroenterology

## 2020-06-10 NOTE — Progress Notes (Signed)
Advanced Heart Failure VAD Team Note  PCP-Cardiologist: No primary care provider on file.   Subjective:    Got 3u RBCs.  hgb 4.5-> 7.5. feels better. MAPs improved with volume resuscitation   Had some melena overnight with old blood per his report. No BM this am yet  Denies ab pain or SOB.  LVAD INTERROGATION:  HeartMate 3 LVAD:   Flow 4.3 liters/min, speed 5700, power 4.0 W, PI 3.4 VAD interrogated personally. Parameters stable.   Objective:    Vital Signs:   Temp:  [98.5 F (36.9 C)-99.1 F (37.3 C)] 98.6 F (37 C) (07/24 0400) Pulse Rate:  [44-88] 71 (07/24 0400) Resp:  [16-31] 22 (07/24 0400) BP: (70-114)/(50-88) 106/73 (07/24 0400) SpO2:  [98 %-100 %] 100 % (07/24 0400)   Mean arterial Pressure 80s  Intake/Output:   Intake/Output Summary (Last 24 hours) at 06/10/2020 0555 Last data filed at 06/10/2020 0400 Gross per 24 hour  Intake 1447.42 ml  Output 1775 ml  Net -327.58 ml     Physical Exam    General:  Well appearing. No resp difficulty HEENT: normal Neck: supple. JVP flat. Carotids 2+ bilat; no bruits. No lymphadenopathy or thyromegaly appreciated. Cor: Mechanical heart sounds with LVAD hum present. Lungs: clear Abdomen: soft, nontender, nondistended. No hepatosplenomegaly. No bruits or masses. Good bowel sounds. Ab dressing ok  Driveline: C/D/I; securement device intact and driveline incorporated Extremities: no cyanosis, clubbing, rash, edema Neuro: alert & orientedx3, cranial nerves grossly intact. moves all 4 extremities w/o difficulty. Affect pleasant   Telemetry   NSR 70s Personally reviewed  Labs   Basic Metabolic Panel: Recent Labs  Lab 06/09/20 1047 06/10/20 0343  NA 136 139  K 4.2 4.3  CL 107 110  CO2 15* 21*  GLUCOSE 156* 116*  BUN 27* 19  CREATININE 1.18 0.93  CALCIUM 8.9 9.0    Liver Function Tests: Recent Labs  Lab 06/09/20 1048  AST 17  ALT 9  ALKPHOS 74  BILITOT 0.7  PROT 6.5  ALBUMIN 3.4*   No results for  input(s): LIPASE, AMYLASE in the last 168 hours. No results for input(s): AMMONIA in the last 168 hours.  CBC: Recent Labs  Lab 06/09/20 1047 06/09/20 1925 06/10/20 0343  WBC 11.8* 11.6* 8.2  HGB 4.5* 6.7* 7.5*  HCT 16.0* 21.1* 24.2*  MCV 95.8 89.8 88.6  PLT 320 231 249    INR: Recent Labs  Lab 06/05/20 0915 06/09/20 1047 06/10/20 0343  INR 2.4* 3.2* 2.7*    Other results:     Imaging   CT ABDOMEN PELVIS WO CONTRAST  Result Date: 06/09/2020 CLINICAL DATA:  Decreased hemoglobin and weakness with black tarry stools, initial encounter EXAM: CT ABDOMEN AND PELVIS WITHOUT CONTRAST TECHNIQUE: Multidetector CT imaging of the abdomen and pelvis was performed following the standard protocol without IV contrast. COMPARISON:  04/16/2020 FINDINGS: Lower chest: Minimal scarring is noted in the left lung base stable from the prior exam. LVAD is noted in the left chest. Previously seen soft tissue changes along the anterior chest wall are slightly more prominent and likely related to the sternal wound debridement and pectoralis flap. Hepatobiliary: No focal liver abnormality is seen. No gallstones, gallbladder wall thickening, or biliary dilatation. Pancreas: Unremarkable. No pancreatic ductal dilatation or surrounding inflammatory changes. Spleen: Normal in size without focal abnormality. Adrenals/Urinary Tract: Adrenal glands are within normal limits. Kidneys are well visualize without renal calculi. Stable right renal cyst is noted. No obstructive changes are seen. The bladder is  well distended. Stomach/Bowel: Colon shows no obstructive or inflammatory changes. The appendix is within normal limits. No definitive colonic mass is seen. Mild diverticular changes noted without diverticulitis. The small bowel and stomach appear within normal limits. Vascular/Lymphatic: Aortic atherosclerosis. No enlarged abdominal or pelvic lymph nodes. Reproductive: Prostate is unremarkable. Other: No abdominal  wall hernia or abnormality. No abdominopelvic ascites. Small amount of air is noted in the anterior abdominal wall just to the right of the umbilicus with some localized inflammatory change. This may be related to the recent surgery. No fluid collection is identified. Musculoskeletal: No acute or significant osseous findings. IMPRESSION: LVAD in place without definitive complicating factors. Mild fluid attenuation in the anterior chest wall inferiorly increased from the prior exam likely related to the recent sternal surgery and reconstruction flap. Small amount of air in the anterior abdominal wall to the right of the umbilicus without definitive hematoma or fluid collection. This may be related to the recent surgery. The wound is open to the skin site. No other focal abnormality is noted. Electronically Signed   By: Alcide Clever M.D.   On: 06/09/2020 14:53      Medications:     Scheduled Medications:  sodium chloride   Intravenous Once   cephALEXin  500 mg Oral QID   gabapentin  600 mg Oral QID   pantoprazole (PROTONIX) IV  40 mg Intravenous Q12H   thiamine  100 mg Oral Daily   varenicline  1 mg Oral Daily   zolpidem  5 mg Oral QHS     Infusions:   PRN Medications:  acetaminophen, ondansetron (ZOFRAN) IV, oxyCODONE   Assessment/Plan:    1. Acute GI bleeding with symptomatic anemia and hypotension - Hgb 4.5 on admit (down from 11 7/12). INR 3.2. CT a/p on 7/23 no RP bleed - BP improved after IVF bolus - Hgb up to 7.5 after 3u RBC on 7/23. Will try to limit RBC transfusion given possible transplant candidacy. Transfuse HGB < 7.0 - ASA stopped. Warfarin on hold. INR 2.7 today - On IV protonix - GI has seen. Plan EGD once INR < 2.0. can give 1mg  vit K as needed  - Daily CBCs 2. 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  - holding coumadin for active bleed/ supratherapeutic INR. - follow LDH. 146 today - CHF/ BP meds on hold given  hypotension/hypovolemia. Restart as needed - Should be transplant candidate eventually if he can quit smoking. 3. 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  - He completed IV abx course w/ cefazolin via PICC and rifabutin. Now on long-term Keflex for MSSA suppression.  - Dressing changed 7/23. He has a small amount of serosanguinous drainage from lower portion of incisionand now there is an open area on the top of the incision, this area was cultured.Wound/BCX drawn.7/23. No results yet.  - WBC 11.8 -> 8.2. AF. - CT stable  - Continue wound care per Dr 8/23.  - Continue Keflex  4. Smoking: He has cut back a lot, still taking Chantix. Encouraged to quit.  5. H/o RLE DVT: Holding coumadin for GIB/anemia.  - SCDs for VTE prophylaxis   6. OSA: Continue CPAP. Continue nightly . 7. Hyperlipidemia: Atorvastatin.  8. Type II diabetes:  - SSI   Can go to 2C  I reviewed the LVAD parameters from today, and compared the results to the patient's prior recorded data.  No programming changes were  made.  The LVAD is functioning within specified parameters.  The patient performs LVAD self-test daily.  LVAD interrogation was negative for any significant power changes, alarms or PI events/speed drops.  LVAD equipment check completed and is in good working order.  Back-up equipment present.   LVAD education done on emergency procedures and precautions and reviewed exit site care.  Length of Stay: 1  Arvilla Meres, MD 06/10/2020, 5:55 AM  VAD Team --- VAD ISSUES ONLY--- Pager [phone] (7am - 7am)  Advanced Heart Failure Team  Pager [phone] (M-F; 7a - 4p)  Please contact [facility] for night-coverage after hours (4p -7a ) and weekends on [email or web address]

## 2020-06-11 DIAGNOSIS — Z95811 Presence of heart assist device: Secondary | ICD-10-CM | POA: Diagnosis not present

## 2020-06-11 DIAGNOSIS — K921 Melena: Secondary | ICD-10-CM | POA: Diagnosis not present

## 2020-06-11 DIAGNOSIS — D689 Coagulation defect, unspecified: Secondary | ICD-10-CM | POA: Diagnosis not present

## 2020-06-11 DIAGNOSIS — D62 Acute posthemorrhagic anemia: Secondary | ICD-10-CM | POA: Diagnosis not present

## 2020-06-11 LAB — CBC
HCT: 21.3 % — ABNORMAL LOW (ref 39.0–52.0)
Hemoglobin: 7 g/dL — ABNORMAL LOW (ref 13.0–17.0)
MCH: 28.8 pg (ref 26.0–34.0)
MCHC: 32.9 g/dL (ref 30.0–36.0)
MCV: 87.7 fL (ref 80.0–100.0)
Platelets: 248 10*3/uL (ref 150–400)
RBC: 2.43 MIL/uL — ABNORMAL LOW (ref 4.22–5.81)
RDW: 15.4 % (ref 11.5–15.5)
WBC: 10.2 10*3/uL (ref 4.0–10.5)
nRBC: 0 % (ref 0.0–0.2)

## 2020-06-11 LAB — BASIC METABOLIC PANEL
Anion gap: 7 (ref 5–15)
BUN: 19 mg/dL (ref 8–23)
CO2: 21 mmol/L — ABNORMAL LOW (ref 22–32)
Calcium: 8.8 mg/dL — ABNORMAL LOW (ref 8.9–10.3)
Chloride: 107 mmol/L (ref 98–111)
Creatinine, Ser: 0.85 mg/dL (ref 0.61–1.24)
GFR calc Af Amer: >60 mL/min (ref >60)
GFR calc non Af Amer: >60 mL/min (ref >60)
Glucose, Bld: 142 mg/dL — ABNORMAL HIGH (ref 70–99)
Potassium: 3.9 mmol/L (ref 3.5–5.1)
Sodium: 135 mmol/L (ref 135–145)

## 2020-06-11 LAB — PREPARE RBC (CROSSMATCH)

## 2020-06-11 LAB — PROTIME-INR
INR: 2 — ABNORMAL HIGH (ref 0.8–1.2)
Prothrombin Time: 21.6 seconds — ABNORMAL HIGH (ref 11.4–15.2)

## 2020-06-11 LAB — LACTATE DEHYDROGENASE: LDH: 141 U/L (ref 98–192)

## 2020-06-11 NOTE — Progress Notes (Signed)
ANTICOAGULATION CONSULT NOTE - Follow Up Consult  Pharmacy Consult for Warfarin  Indication: LVAD  Allergies  Allergen Reactions  . Pineapple Hives  . Chlorhexidine Rash  . Other Rash    Prep pads    Patient Measurements: Weight: 85.3 kg (188 lb 0.8 oz)   Vital Signs: Temp: 98.8 F (37.1 C) (07/25 1230) Temp Source: Oral (07/25 1230) BP: 106/88 (07/25 1230) Pulse Rate: 68 (07/25 1115)  Labs: Recent Labs    06/09/20 1047 06/09/20 1047 06/09/20 1925 06/09/20 1925 06/10/20 0343 06/11/20 0233  HGB 4.5*   < > 6.7*   < > 7.5* 7.0*  HCT 16.0*   < > 21.1*  --  24.2* 21.3*  PLT 320   < > 231  --  249 248  LABPROT 31.8*  --   --   --  27.7* 21.6*  INR 3.2*  --   --   --  2.7* 2.0*  CREATININE 1.18  --   --   --  0.93 0.85   < > = values in this interval not displayed.    Estimated Creatinine Clearance: 97.2 mL/min (by C-G formula based on SCr of 0.85 mg/dL).   Assessment: 59yom with HF s/p LVAD HM3 placed 9/19 admitted with low Hgb4.5, INR 3.2 on admission - increased from 2.4 on 7/19.    No bowel movement overnight. Hgb trending down to 7 - ordered for PRBCs. INR today drifted down to 2. Plt 248, LDH stable at 141.   Goal of Therapy:  2-2.5  Monitor platelets by anticoagulation protocol: Yes   Plan:  Continue to hold warfarin Replace PRBC F/u GI plan - plan for EGD tomorrow at 0900  Sherron Monday, PharmD, BCCCP Clinical Pharmacist  Phone: [phone] 06/11/2020 2:06 PM  Please check AMION for all [facility] phone numbers After 10:00 PM, call Main Pharmacy [phone]

## 2020-06-11 NOTE — H&P (View-Only) (Signed)
Patient ID: Theodore Welch, male   DOB: 24-Jul-1961, 59 y.o.   MRN: [id number]    Progress Note   Subjective   Day # 3 CC; melena, anemia  Coumadin on hold - INR 2.0  HGB 7.0/21.3- transfused x 1 this am  Patient out in the lobby visiting with his brother, feels fine posttransfusion. He has not had a bowel movement today    Objective   Vital signs in last 24 hours: Temp:  [97.8 F (36.6 C)-98.8 F (37.1 C)] 98.8 F (37.1 C) (07/25 1230) Pulse Rate:  [68-100] 68 (07/25 1115) Resp:  [14-36] 15 (07/25 1230) BP: (90-117)/(69-89) 106/88 (07/25 1230) SpO2:  [99 %-100 %] 100 % (07/25 0720) Weight:  [85.3 kg] 85.3 kg (07/25 0300) Last BM Date: 06/10/20 General:     AA male  in NAD Heart:  LVAD hum Lungs: Respirations even and unlabored, lungs CTA bilaterally Abdomen:  Soft, nontender and nondistended. Normal bowel sounds. Extremities:  Without edema. Neurologic:  Alert and oriented,  grossly normal neurologically. Psych:  Cooperative. Normal mood and affect.  Intake/Output from previous day: 07/24 0701 - 07/25 0700 In: 1130 [P.O.:1130] Out: 2525 [Urine:2525] Intake/Output this shift: Total I/O In: 695 [P.O.:360; Blood:335] Out: -   Lab Results: Recent Labs    06/09/20 1925 06/10/20 0343 06/11/20 0233  WBC 11.6* 8.2 10.2  HGB 6.7* 7.5* 7.0*  HCT 21.1* 24.2* 21.3*  PLT 231 249 248   BMET Recent Labs    06/09/20 1047 06/10/20 0343 06/11/20 0233  NA 136 139 135  K 4.2 4.3 3.9  CL 107 110 107  CO2 15* 21* 21*  GLUCOSE 156* 116* 142*  BUN 27* 19 19  CREATININE 1.18 0.93 0.85  CALCIUM 8.9 9.0 8.8*   LFT Recent Labs    06/09/20 1048  PROT 6.5  ALBUMIN 3.4*  AST 17  ALT 9  ALKPHOS 74  BILITOT 0.7  BILIDIR 0.2  IBILI 0.5   PT/INR Recent Labs    06/10/20 0343 06/11/20 0233  LABPROT 27.7* 21.6*  INR 2.7* 2.0*    Studies/Results: CT ABDOMEN PELVIS WO CONTRAST  Result Date: 06/09/2020 CLINICAL DATA:  Decreased hemoglobin and weakness with black  tarry stools, initial encounter EXAM: CT ABDOMEN AND PELVIS WITHOUT CONTRAST TECHNIQUE: Multidetector CT imaging of the abdomen and pelvis was performed following the standard protocol without IV contrast. COMPARISON:  04/16/2020 FINDINGS: Lower chest: Minimal scarring is noted in the left lung base stable from the prior exam. LVAD is noted in the left chest. Previously seen soft tissue changes along the anterior chest wall are slightly more prominent and likely related to the sternal wound debridement and pectoralis flap. Hepatobiliary: No focal liver abnormality is seen. No gallstones, gallbladder wall thickening, or biliary dilatation. Pancreas: Unremarkable. No pancreatic ductal dilatation or surrounding inflammatory changes. Spleen: Normal in size without focal abnormality. Adrenals/Urinary Tract: Adrenal glands are within normal limits. Kidneys are well visualize without renal calculi. Stable right renal cyst is noted. No obstructive changes are seen. The bladder is well distended. Stomach/Bowel: Colon shows no obstructive or inflammatory changes. The appendix is within normal limits. No definitive colonic mass is seen. Mild diverticular changes noted without diverticulitis. The small bowel and stomach appear within normal limits. Vascular/Lymphatic: Aortic atherosclerosis. No enlarged abdominal or pelvic lymph nodes. Reproductive: Prostate is unremarkable. Other: No abdominal wall hernia or abnormality. No abdominopelvic ascites. Small amount of air is noted in the anterior abdominal wall just to the right of the umbilicus with  some localized inflammatory change. This may be related to the recent surgery. No fluid collection is identified. Musculoskeletal: No acute or significant osseous findings. IMPRESSION: LVAD in place without definitive complicating factors. Mild fluid attenuation in the anterior chest wall inferiorly increased from the prior exam likely related to the recent sternal surgery and  reconstruction flap. Small amount of air in the anterior abdominal wall to the right of the umbilicus without definitive hematoma or fluid collection. This may be related to the recent surgery. The wound is open to the skin site. No other focal abnormality is noted. Electronically Signed   By: Alcide Clever M.D.   On: 06/09/2020 14:53       Assessment / Plan:    #55 59 year old African-American male status post LVAD 2019, on chronic Coumadin and aspirin admitted with 2-day history of weakness and intermittent black stool over the previous week.  Hemoglobin 4.5 on admit. Presentation consistent with subacute GI bleed-rule out peptic ulcer disease, chronic gastropathy, AVMs, low suspicion for GI source of present  Hemodynamically stable since admission, transfusion x2 total-had 1 unit this a.m. for hemoglobin of 7  #2 complicated recent history with driveline infection, then subxiphoid abscess and cellulitis with admission in June 2021 for bleeding from the subxiphoid wound required multiple units of blood #3 adult onset diabetes mellitus 4.  Prior diagnosis of child's a cirrhosis-CT scan on admit with no mention of cirrhosis #5 history of colon polyps-up-to-date with [facility]-last colonoscopy 4 years ago Has history of polyps  Plan; regular diet today, n.p.o. after midnight Coumadin continues on hold Continue IV PPI twice daily Patient is scheduled for upper endoscopy and enteroscopy with Dr. Barron Alvine tomorrow a.m. Further recommendations pending findings at EGD and enteroscopy. We'll need to restart Coumadin post procedures.     Active Problems:   GIB (gastrointestinal bleeding)   Melena   Acute blood loss anemia   Coagulopathy (HCC)   Chronic anticoagulation   Congestive dilated cardiomyopathy (HCC)     LOS: 2 days   Amy Esterwood PA-C 06/11/2020, 1:25 PM  GI ATTENDING  Interval history and data reviewed.  Patient seen and examined.   Agree with interval progress note as outlined above.  Patient is clinically stable.  Hemoglobin drifted a bit.  He did receive 1 unit of blood earlier.  INR now 2.  He is scheduled for upper endoscopy/enteroscopy with Dr. Barron Alvine tomorrow to evaluate recent upper GI bleed.  Patient is high risk.  Discussed with Dr. Gala Romney.  LVAD nurse will be available during the procedure.  Procedure time is 9 AM.  Wilhemina Bonito. Eda Keys., M.D. [facility] Division of Gastroenterology

## 2020-06-11 NOTE — Progress Notes (Signed)
Advanced Heart Failure VAD Team Note  PCP-Cardiologist: No primary care provider on file.   Subjective:    Got 3u RBCs.  hgb 4.5-> 7.5. feels better. MAPs improved with volume resuscitation   Feels good today. No BM overnight. Passing gas. No CP or SOB.   Off warfarin INR 2.0  Hgb 7.5 -> 7.0     LVAD INTERROGATION:  HeartMate 3 LVAD:   Flow 4.6 liters/min, speed 5700, power 4.0 W, PI 3.4 VAD interrogated personally. Parameters stable.  Objective:    Vital Signs:   Temp:  [97 F (36.1 C)-98.6 F (37 C)] 98.2 F (36.8 C) (07/25 0720) Pulse Rate:  [65-100] 77 (07/25 0720) Resp:  [15-36] 23 (07/25 0800) BP: (90-117)/(69-89) 90/69 (07/25 0720) SpO2:  [98 %-100 %] 100 % (07/25 0720) Weight:  [85.3 kg] 85.3 kg (07/25 0300) Last BM Date: 06/10/20 Mean arterial Pressure 70-80s  Intake/Output:   Intake/Output Summary (Last 24 hours) at 06/11/2020 0856 Last data filed at 06/11/2020 0447 Gross per 24 hour  Intake 890 ml  Output 2525 ml  Net -1635 ml     Physical Exam    General:  NAD.  HEENT: normal  Neck: supple. JVP not elevated.  Carotids 2+ bilat; no bruits. No lymphadenopathy or thryomegaly appreciated. Cor: LVAD hum.  Lungs: Clear. Abdomen: soft, nontender, non-distended. No hepatosplenomegaly. No bruits or masses. Good bowel sounds. Driveline site clean. Anchor in place. Ab dressing in place c/d/i Extremities: no cyanosis, clubbing, rash. Warm no edema  Neuro: alert & oriented x 3. No focal deficits. Moves all 4 without problem    Telemetry   NSR 70-80s Personally reviewed  Labs   Basic Metabolic Panel: Recent Labs  Lab 06/09/20 1047 06/10/20 0343 06/11/20 0233  NA 136 139 135  K 4.2 4.3 3.9  CL 107 110 107  CO2 15* 21* 21*  GLUCOSE 156* 116* 142*  BUN 27* 19 19  CREATININE 1.18 0.93 0.85  CALCIUM 8.9 9.0 8.8*    Liver Function Tests: Recent Labs  Lab 06/09/20 1048  AST 17  ALT 9  ALKPHOS 74  BILITOT 0.7  PROT 6.5  ALBUMIN 3.4*   No  results for input(s): LIPASE, AMYLASE in the last 168 hours. No results for input(s): AMMONIA in the last 168 hours.  CBC: Recent Labs  Lab 06/09/20 1047 06/09/20 1925 06/10/20 0343 06/11/20 0233  WBC 11.8* 11.6* 8.2 10.2  HGB 4.5* 6.7* 7.5* 7.0*  HCT 16.0* 21.1* 24.2* 21.3*  MCV 95.8 89.8 88.6 87.7  PLT 320 231 249 248    INR: Recent Labs  Lab 06/05/20 0915 06/09/20 1047 06/10/20 0343 06/11/20 0233  INR 2.4* 3.2* 2.7* 2.0*    Other results:     Imaging   CT ABDOMEN PELVIS WO CONTRAST  Result Date: 06/09/2020 CLINICAL DATA:  Decreased hemoglobin and weakness with black tarry stools, initial encounter EXAM: CT ABDOMEN AND PELVIS WITHOUT CONTRAST TECHNIQUE: Multidetector CT imaging of the abdomen and pelvis was performed following the standard protocol without IV contrast. COMPARISON:  04/16/2020 FINDINGS: Lower chest: Minimal scarring is noted in the left lung base stable from the prior exam. LVAD is noted in the left chest. Previously seen soft tissue changes along the anterior chest wall are slightly more prominent and likely related to the sternal wound debridement and pectoralis flap. Hepatobiliary: No focal liver abnormality is seen. No gallstones, gallbladder wall thickening, or biliary dilatation. Pancreas: Unremarkable. No pancreatic ductal dilatation or surrounding inflammatory changes. Spleen: Normal in size without  focal abnormality. Adrenals/Urinary Tract: Adrenal glands are within normal limits. Kidneys are well visualize without renal calculi. Stable right renal cyst is noted. No obstructive changes are seen. The bladder is well distended. Stomach/Bowel: Colon shows no obstructive or inflammatory changes. The appendix is within normal limits. No definitive colonic mass is seen. Mild diverticular changes noted without diverticulitis. The small bowel and stomach appear within normal limits. Vascular/Lymphatic: Aortic atherosclerosis. No enlarged abdominal or pelvic  lymph nodes. Reproductive: Prostate is unremarkable. Other: No abdominal wall hernia or abnormality. No abdominopelvic ascites. Small amount of air is noted in the anterior abdominal wall just to the right of the umbilicus with some localized inflammatory change. This may be related to the recent surgery. No fluid collection is identified. Musculoskeletal: No acute or significant osseous findings. IMPRESSION: LVAD in place without definitive complicating factors. Mild fluid attenuation in the anterior chest wall inferiorly increased from the prior exam likely related to the recent sternal surgery and reconstruction flap. Small amount of air in the anterior abdominal wall to the right of the umbilicus without definitive hematoma or fluid collection. This may be related to the recent surgery. The wound is open to the skin site. No other focal abnormality is noted. Electronically Signed   By: Alcide Clever M.D.   On: 06/09/2020 14:53     Medications:     Scheduled Medications: . sodium chloride   Intravenous Once  . cephALEXin  500 mg Oral QID  . gabapentin  600 mg Oral QID  . pantoprazole (PROTONIX) IV  40 mg Intravenous Q12H  . thiamine  100 mg Oral Daily  . varenicline  1 mg Oral Daily  . zolpidem  5 mg Oral QHS    Infusions:   PRN Medications: acetaminophen, ondansetron (ZOFRAN) IV, oxyCODONE   Assessment/Plan:    1. Acute GI bleeding with symptomatic anemia and hypotension - Hgb 4.5 on admit (down from 11 7/12). INR 3.2. CT a/p on 7/23 no RP bleed - BP improved after IVF bolus - Hgb up to 7.5 after 3u RBC on 7/23. Trying to limit RBC transfusion given possible transplant candidacy. Hwever with hgb 7.5 -> 7.0 will give one more unit - ASA stopped. Warfarin on hold. INR 2.0 today - On IV protonix - GI has seen. Plan EGD once INR < 2.0. I d/w Dr. Marina Goodell will plan tomorrow ~ 9am. NPO ordered.  - Daily CBCs 2. Chronic systolic CHF: Nonischemic cardiomyopathy, now s/p Heartmate 3 LVAD in  9/19.  - holding coumadin for active bleed/ supratherapeutic INR. - LDH 141 today - CHF/ BP meds on hold given hypotension/hypovolemia. Restart as needed. MAPs ok today - Should be transplant candidate eventually if he can quit smoking. - VAD interrogated personally. Parameters stable. 3. 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  - He completed IV abx course w/ cefazolin via PICC and rifabutin. Now on long-term Keflex for MSSA suppression.  - Dressing changed 7/23. He has a small amount of serosanguinous drainage from lower portion of incisionand now there is an open area on the top of the incision, this area was cultured.Wound/BCX drawn -> NGTD - CT stable  - Continue wound care per Dr Maren Beach.  - Continue Keflex  4. Smoking: He has cut back a lot, still taking Chantix. Encouraged to quit.  5. H/o RLE DVT: Holding coumadin for GIB/anemia.  - SCDs for VTE prophylaxis   6. OSA: Continue CPAP. Continue nightly . 7. Hyperlipidemia:  Atorvastatin.  8. Type II diabetes:  - SSI   I reviewed the LVAD parameters from today, and compared the results to the patient's prior recorded data.  No programming changes were made.  The LVAD is functioning within specified parameters.  The patient performs LVAD self-test daily.  LVAD interrogation was negative for any significant power changes, alarms or PI events/speed drops.  LVAD equipment check completed and is in good working order.  Back-up equipment present.   LVAD education done on emergency procedures and precautions and reviewed exit site care.  Length of Stay: 2  Arvilla Meres, MD 06/11/2020, 8:56 AM  VAD Team --- VAD ISSUES ONLY--- Pager [phone] (7am - 7am)  Advanced Heart Failure Team  Pager [phone] (M-F; 7a - 4p)  Please contact [facility] for night-coverage after hours (4p -7a ) and weekends on [email or web address]

## 2020-06-11 NOTE — Progress Notes (Addendum)
Patient ID: Theodore Welch, male   DOB: 01/30/1961, 59 y.o.   MRN: [id number]    Progress Note   Subjective   Day # 3 CC; melena, anemia  Coumadin on hold - INR 2.0  HGB 7.0/21.3- transfused x 1 this am  Patient out in the lobby visiting with his brother, feels fine posttransfusion. He has not had a bowel movement today    Objective   Vital signs in last 24 hours: Temp:  [97.8 F (36.6 C)-98.8 F (37.1 C)] 98.8 F (37.1 C) (07/25 1230) Pulse Rate:  [68-100] 68 (07/25 1115) Resp:  [14-36] 15 (07/25 1230) BP: (90-117)/(69-89) 106/88 (07/25 1230) SpO2:  [99 %-100 %] 100 % (07/25 0720) Weight:  [85.3 kg] 85.3 kg (07/25 0300) Last BM Date: 06/10/20 General:     AA male  in NAD Heart:  LVAD hum Lungs: Respirations even and unlabored, lungs CTA bilaterally Abdomen:  Soft, nontender and nondistended. Normal bowel sounds. Extremities:  Without edema. Neurologic:  Alert and oriented,  grossly normal neurologically. Psych:  Cooperative. Normal mood and affect.  Intake/Output from previous day: 07/24 0701 - 07/25 0700 In: 1130 [P.O.:1130] Out: 2525 [Urine:2525] Intake/Output this shift: Total I/O In: 695 [P.O.:360; Blood:335] Out: -   Lab Results: Recent Labs    06/09/20 1925 06/10/20 0343 06/11/20 0233  WBC 11.6* 8.2 10.2  HGB 6.7* 7.5* 7.0*  HCT 21.1* 24.2* 21.3*  PLT 231 249 248   BMET Recent Labs    06/09/20 1047 06/10/20 0343 06/11/20 0233  NA 136 139 135  K 4.2 4.3 3.9  CL 107 110 107  CO2 15* 21* 21*  GLUCOSE 156* 116* 142*  BUN 27* 19 19  CREATININE 1.18 0.93 0.85  CALCIUM 8.9 9.0 8.8*   LFT Recent Labs    06/09/20 1048  PROT 6.5  ALBUMIN 3.4*  AST 17  ALT 9  ALKPHOS 74  BILITOT 0.7  BILIDIR 0.2  IBILI 0.5   PT/INR Recent Labs    06/10/20 0343 06/11/20 0233  LABPROT 27.7* 21.6*  INR 2.7* 2.0*    Studies/Results: CT ABDOMEN PELVIS WO CONTRAST  Result Date: 06/09/2020 CLINICAL DATA:  Decreased hemoglobin and weakness with black  tarry stools, initial encounter EXAM: CT ABDOMEN AND PELVIS WITHOUT CONTRAST TECHNIQUE: Multidetector CT imaging of the abdomen and pelvis was performed following the standard protocol without IV contrast. COMPARISON:  04/16/2020 FINDINGS: Lower chest: Minimal scarring is noted in the left lung base stable from the prior exam. LVAD is noted in the left chest. Previously seen soft tissue changes along the anterior chest wall are slightly more prominent and likely related to the sternal wound debridement and pectoralis flap. Hepatobiliary: No focal liver abnormality is seen. No gallstones, gallbladder wall thickening, or biliary dilatation. Pancreas: Unremarkable. No pancreatic ductal dilatation or surrounding inflammatory changes. Spleen: Normal in size without focal abnormality. Adrenals/Urinary Tract: Adrenal glands are within normal limits. Kidneys are well visualize without renal calculi. Stable right renal cyst is noted. No obstructive changes are seen. The bladder is well distended. Stomach/Bowel: Colon shows no obstructive or inflammatory changes. The appendix is within normal limits. No definitive colonic mass is seen. Mild diverticular changes noted without diverticulitis. The small bowel and stomach appear within normal limits. Vascular/Lymphatic: Aortic atherosclerosis. No enlarged abdominal or pelvic lymph nodes. Reproductive: Prostate is unremarkable. Other: No abdominal wall hernia or abnormality. No abdominopelvic ascites. Small amount of air is noted in the anterior abdominal wall just to the right of the umbilicus with   some localized inflammatory change. This may be related to the recent surgery. No fluid collection is identified. Musculoskeletal: No acute or significant osseous findings. IMPRESSION: LVAD in place without definitive complicating factors. Mild fluid attenuation in the anterior chest wall inferiorly increased from the prior exam likely related to the recent sternal surgery and  reconstruction flap. Small amount of air in the anterior abdominal wall to the right of the umbilicus without definitive hematoma or fluid collection. This may be related to the recent surgery. The wound is open to the skin site. No other focal abnormality is noted. Electronically Signed   By: Alcide Clever M.D.   On: 06/09/2020 14:53       Assessment / Plan:    #55 59 year old African-American male status post LVAD 2019, on chronic Coumadin and aspirin admitted with 2-day history of weakness and intermittent black stool over the previous week.  Hemoglobin 4.5 on admit. Presentation consistent with subacute GI bleed-rule out peptic ulcer disease, chronic gastropathy, AVMs, low suspicion for GI source of present  Hemodynamically stable since admission, transfusion x2 total-had 1 unit this a.m. for hemoglobin of 7  #2 complicated recent history with driveline infection, then subxiphoid abscess and cellulitis with admission in June 2021 for bleeding from the subxiphoid wound required multiple units of blood #3 adult onset diabetes mellitus 4.  Prior diagnosis of child's a cirrhosis-CT scan on admit with no mention of cirrhosis #5 history of colon polyps-up-to-date [facility]-last colonoscopy 4 years ago Has history of polyps  Plan; regular diet today, n.p.o. after midnight Coumadin continues on hold Continue IV PPI twice daily Patient is scheduled for upper endoscopy and enteroscopy with Dr. Barron Alvine tomorrow a.m. Further recommendations pending findings at EGD and enteroscopy. We'll need to restart Coumadin post procedures.     Active Problems:   GIB (gastrointestinal bleeding)   Melena   Acute blood loss anemia   Coagulopathy (HCC)   Chronic anticoagulation   Congestive dilated cardiomyopathy (HCC)     LOS: 2 days   Amy Esterwood PA-C 06/11/2020, 1:25 PM  GI ATTENDING  Interval history and data reviewed.  Patient seen and examined.   Agree with interval progress note as outlined above.  Patient is clinically stable.  Hemoglobin drifted a bit.  He did receive 1 unit of blood earlier.  INR now 2.  He is scheduled for upper endoscopy/enteroscopy with Dr. Barron Alvine tomorrow to evaluate recent upper GI bleed.  Patient is high risk.  Discussed with Dr. Gala Romney.  LVAD nurse will be available during the procedure.  Procedure time is 9 AM.  Wilhemina Bonito. Eda Keys., M.D. [facility]

## 2020-06-12 ENCOUNTER — Encounter (HOSPITAL_COMMUNITY): Payer: Self-pay | Admitting: Cardiology

## 2020-06-12 ENCOUNTER — Inpatient Hospital Stay (HOSPITAL_COMMUNITY): Payer: No Typology Code available for payment source | Admitting: Certified Registered Nurse Anesthetist

## 2020-06-12 ENCOUNTER — Encounter (HOSPITAL_COMMUNITY): Payer: Self-pay

## 2020-06-12 ENCOUNTER — Other Ambulatory Visit (HOSPITAL_COMMUNITY): Payer: No Typology Code available for payment source

## 2020-06-12 ENCOUNTER — Encounter (HOSPITAL_COMMUNITY)
Admission: AD | Disposition: A | Discharge status: Home / Self Care | Payer: Self-pay | Source: Ambulatory Visit | Attending: Cardiology

## 2020-06-12 DIAGNOSIS — K31811 Angiodysplasia of stomach and duodenum with bleeding: Secondary | ICD-10-CM | POA: Diagnosis not present

## 2020-06-12 DIAGNOSIS — Z95811 Presence of heart assist device: Secondary | ICD-10-CM | POA: Diagnosis not present

## 2020-06-12 DIAGNOSIS — K5521 Angiodysplasia of colon with hemorrhage: Secondary | ICD-10-CM

## 2020-06-12 DIAGNOSIS — K921 Melena: Secondary | ICD-10-CM | POA: Diagnosis not present

## 2020-06-12 DIAGNOSIS — D62 Acute posthemorrhagic anemia: Secondary | ICD-10-CM | POA: Diagnosis not present

## 2020-06-12 HISTORY — PX: ENTEROSCOPY: SHX5533

## 2020-06-12 HISTORY — PX: HOT HEMOSTASIS: SHX5433

## 2020-06-12 LAB — TYPE AND SCREEN
ABO/RH(D): O POS
Antibody Screen: NEGATIVE
Unit division: 0
Unit division: 0
Unit division: 0
Unit division: 0

## 2020-06-12 LAB — BPAM RBC
Blood Product Expiration Date: 202108012359
Blood Product Expiration Date: 202108272359
Blood Product Expiration Date: 202108272359
Blood Product Expiration Date: 202108272359
ISSUE DATE / TIME: 202107231317
ISSUE DATE / TIME: 202107231317
ISSUE DATE / TIME: 202107232243
ISSUE DATE / TIME: 202107250944
Unit Type and Rh: 5100
Unit Type and Rh: 5100
Unit Type and Rh: 5100
Unit Type and Rh: 5100

## 2020-06-12 LAB — BASIC METABOLIC PANEL
Anion gap: 9 (ref 5–15)
BUN: 19 mg/dL (ref 8–23)
CO2: 20 mmol/L — ABNORMAL LOW (ref 22–32)
Calcium: 9.1 mg/dL (ref 8.9–10.3)
Chloride: 109 mmol/L (ref 98–111)
Creatinine, Ser: 1.16 mg/dL (ref 0.61–1.24)
GFR calc Af Amer: >60 mL/min (ref >60)
GFR calc non Af Amer: >60 mL/min (ref >60)
Glucose, Bld: 143 mg/dL — ABNORMAL HIGH (ref 70–99)
Potassium: 3.9 mmol/L (ref 3.5–5.1)
Sodium: 138 mmol/L (ref 135–145)

## 2020-06-12 LAB — CBC
HCT: 23.4 % — ABNORMAL LOW (ref 39.0–52.0)
Hemoglobin: 7.4 g/dL — ABNORMAL LOW (ref 13.0–17.0)
MCH: 27.6 pg (ref 26.0–34.0)
MCHC: 31.6 g/dL (ref 30.0–36.0)
MCV: 87.3 fL (ref 80.0–100.0)
Platelets: 233 10*3/uL (ref 150–400)
RBC: 2.68 MIL/uL — ABNORMAL LOW (ref 4.22–5.81)
RDW: 16.1 % — ABNORMAL HIGH (ref 11.5–15.5)
WBC: 10.5 10*3/uL (ref 4.0–10.5)
nRBC: 0 % (ref 0.0–0.2)

## 2020-06-12 LAB — PROTIME-INR
INR: 1.3 — ABNORMAL HIGH (ref 0.8–1.2)
Prothrombin Time: 15.8 seconds — ABNORMAL HIGH (ref 11.4–15.2)

## 2020-06-12 LAB — LACTATE DEHYDROGENASE: LDH: 165 U/L (ref 98–192)

## 2020-06-12 LAB — HEMOGLOBIN FREE, PLASMA: Hgb, Plasma: 1.2 mg/dL (ref 0.0–4.9)

## 2020-06-12 SURGERY — ENTEROSCOPY
Anesthesia: Monitor Anesthesia Care

## 2020-06-12 MED ORDER — PROPOFOL 500 MG/50ML IV EMUL
INTRAVENOUS | Status: DC | PRN
  Administered 2020-06-12: 100 ug/kg/min via INTRAVENOUS
  Administered 2020-06-12: 175 ug/kg/min via INTRAVENOUS

## 2020-06-12 MED ORDER — GLUCAGON HCL RDNA (DIAGNOSTIC) 1 MG IJ SOLR
INTRAMUSCULAR | Status: DC | PRN
  Administered 2020-06-12: .5 mg via INTRAVENOUS

## 2020-06-12 MED ORDER — PHENYLEPHRINE HCL-NACL 10-0.9 MG/250ML-% IV SOLN
INTRAVENOUS | Status: DC | PRN
Start: 2020-06-12 — End: 2020-06-12
  Administered 2020-06-12: 25 ug/min via INTRAVENOUS

## 2020-06-12 MED ORDER — LACTATED RINGERS IV SOLN
INTRAVENOUS | Status: DC | PRN
Start: 2020-06-12 — End: 2020-06-12

## 2020-06-12 MED ORDER — LIDOCAINE 2% (20 MG/ML) 5 ML SYRINGE
INTRAMUSCULAR | Status: DC | PRN
  Administered 2020-06-12: 60 mg via INTRAVENOUS

## 2020-06-12 MED ORDER — PROPOFOL 10 MG/ML IV BOLUS
INTRAVENOUS | Status: DC | PRN
  Administered 2020-06-12 (×3): 20 mg via INTRAVENOUS

## 2020-06-12 MED ORDER — VANCOMYCIN HCL IN DEXTROSE 1-5 GM/200ML-% IV SOLN
1000.0000 mg | Freq: Two times a day (BID) | INTRAVENOUS | Status: DC
  Administered 2020-06-12 – 2020-06-15 (×6): 1000 mg via INTRAVENOUS
  Filled 2020-06-12 (×8): qty 200

## 2020-06-12 NOTE — Progress Notes (Signed)
VAD Coordinator Procedure Note:   VAD Coordinator met patient in Endoscopy. Pt undergoing small bowel endoscopy   per Dr. Bryan Lemma. Hemodynamics and VAD parameters monitored by myself and anesthesia throughout the procedure. Blood pressures were obtained with automatic cuff on left arm.    Time: Doppler Auto  BP Flow PI Power Speed  Pre-procedure:  0900  116/80(89) 4.4 3.5 4.4 5700                    Sedation Induction: 0935  93/78(85) 4.5 3.3 4.2 5700   0945  90/75(81) 4.5 3.3 4.3 5700   1000  102/80(88) 4.3 4.0 4.4 5700   1015  92/74(81) 4.5 3.5 4.3 5700  Recovery Area: 1026  82/62(65) 4.6 3.5 4.3 5700             Patient tolerated the procedure well. VAD Coordinator accompanied and remained with patient in recovery area.    Patient Disposition: pt transported to [facility] on batteries and handoff given to [place].  Tanda Rockers RN, BSN VAD Coordinator 24/7 Pager [phone]

## 2020-06-12 NOTE — Progress Notes (Signed)
Pharmacy Antibiotic Note  Theodore Welch is a 59 y.o. male with possible wound infection. Patient has a VAD  SCr 1.16  Height: 5\' 10"  (177.8 cm) Weight: 85.7 kg (189 lb) IBW/kg (Calculated) : 73  Temp (24hrs), Avg:98.2 F (36.8 C), Min:97.9 F (36.6 C), Max:98.5 F (36.9 C)  Recent Labs  Lab 06/09/20 1047 06/09/20 1925 06/10/20 0343 06/11/20 0233 06/12/20 0156  WBC 11.8* 11.6* 8.2 10.2 10.5  CREATININE 1.18  --  0.93 0.85 1.16    Estimated Creatinine Clearance: 69 mL/min (by C-G formula based on SCr of 1.16 mg/dL).    Allergies  Allergen Reactions  . Pineapple Hives  . Chlorhexidine Rash  . Other Rash    Prep pads    Plan: vanc 1 g q12h - est auc 523 Monitor renal fx lvls prn  06/14/20, PharmD, BCPS, BCCCP Clinical Pharmacist [phone]  Please check AMION for all [facility] numbers  06/12/2020 4:48 PM

## 2020-06-12 NOTE — Progress Notes (Addendum)
Advanced Heart Failure VAD Team Note  PCP-Cardiologist: No primary care provider on file.   Subjective:    Got 3u RBCs 7/23 and 1u 7/25 .    Off warfarin INR 1.3  Hgb 7.5 -> 7.0 ->7.4   Denies SOB. No BM over the last 24 hours.    LVAD INTERROGATION:  HeartMate 3 LVAD:   Flow 4.5 liters/min, speed 5700, power 4.3 W, PI 2.8. > 60 PIC events. VAD interrogated personally. Parameters stable.  Objective:    Vital Signs:   Temp:  [97.9 F (36.6 C)-98.8 F (37.1 C)] 98 F (36.7 C) (07/26 0339) Pulse Rate:  [68-84] 74 (07/26 0339) Resp:  [14-23] 20 (07/26 0339) BP: (86-116)/(62-88) 86/71 (07/26 0339) SpO2:  [99 %-100 %] 100 % (07/26 0339) Weight:  [85.9 kg] 85.9 kg (07/26 0339) Last BM Date: 06/10/20 Mean arterial Pressure   Intake/Output:   Intake/Output Summary (Last 24 hours) at 06/12/2020 0749 Last data filed at 06/11/2020 2331 Gross per 24 hour  Intake 1415 ml  Output 800 ml  Net 615 ml     Physical Exam    Physical Exam: GENERAL: NAD. In bed.  HEENT: normal  NECK: Supple, JVP  Flat .  2+ bilaterally, no bruits.  No lymphadenopathy or thyromegaly appreciated.   CARDIAC:  Mechanical heart sounds with LVAD hum present.  LUNGS:  Clear to auscultation bilaterally.  ABDOMEN:  Soft, round, nontender, positive bowel sounds x4.     LVAD exit site: Dressing dry and intact.  No erythema or drainage.  Stabilization device present and accurately applied.  Driveline dressing is being changed daily per sterile technique. EXTREMITIES:  Warm and dry, no cyanosis, clubbing, rash or edema  NEUROLOGIC:  Alert and oriented x 3  No aphasia.  No dysarthria.  Affect pleasant.       Telemetry   NSR 70s   Labs   Basic Metabolic Panel: Recent Labs  Lab 06/09/20 1047 06/09/20 1047 06/10/20 0343 06/11/20 0233 06/12/20 0156  NA 136  --  139 135 138  K 4.2  --  4.3 3.9 3.9  CL 107  --  110 107 109  CO2 15*  --  21* 21* 20*  GLUCOSE 156*  --  116* 142* 143*  BUN 27*  --   19 19 19   CREATININE 1.18  --  0.93 0.85 1.16  CALCIUM 8.9   < > 9.0 8.8* 9.1   < > = values in this interval not displayed.    Liver Function Tests: Recent Labs  Lab 06/09/20 1048  AST 17  ALT 9  ALKPHOS 74  BILITOT 0.7  PROT 6.5  ALBUMIN 3.4*   No results for input(s): LIPASE, AMYLASE in the last 168 hours. No results for input(s): AMMONIA in the last 168 hours.  CBC: Recent Labs  Lab 06/09/20 1047 06/09/20 1925 06/10/20 0343 06/11/20 0233 06/12/20 0156  WBC 11.8* 11.6* 8.2 10.2 10.5  HGB 4.5* 6.7* 7.5* 7.0* 7.4*  HCT 16.0* 21.1* 24.2* 21.3* 23.4*  MCV 95.8 89.8 88.6 87.7 87.3  PLT 320 231 249 248 233    INR: Recent Labs  Lab 06/05/20 0915 06/09/20 1047 06/10/20 0343 06/11/20 0233 06/12/20 0156  INR 2.4* 3.2* 2.7* 2.0* 1.3*    Other results:     Imaging   No results found.   Medications:     Scheduled Medications: . sodium chloride   Intravenous Once  . cephALEXin  500 mg Oral QID  . gabapentin  600 mg  Oral QID  . pantoprazole (PROTONIX) IV  40 mg Intravenous Q12H  . thiamine  100 mg Oral Daily  . zolpidem  5 mg Oral QHS    Infusions:   PRN Medications: acetaminophen, ondansetron (ZOFRAN) IV, oxyCODONE   Assessment/Plan:    1. Acute GI bleeding with symptomatic anemia and hypotension - Hgb 4.5 on admit (down from 11 7/12). INR 3.2. CT a/p on 7/23 no RP bleed - BP improved after IVF bolus - Hgb up to 7.5 after 3u RBC on 7/23 and 1u PRBC 7/26 Hgb 7.4 . - ASA stopped. Warfarin on hold. INR 1.3 - On IV protonix - GI has seen. Plan EGD today  2. 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  - holding coumadin for active bleed/ supratherapeutic INR. - LDH stable.  - CHF/ BP meds on hold given hypotension/hypovolemia. Restart as needed. MAPs ok today - Should be transplant candidate eventually if he can quit smoking. - VAD interrogated personally. Parameters stable. 3. Recurrent MSSA  driveline infection with subxiphoid abscess: Now s/p rectus flap coverage of wound site.  - He completed IV abx course w/ cefazolin via PICC and rifabutin. Now on long-term Keflex for MSSA suppression.  - Dressing changed 7/23. He has a small amount of serosanguinous drainage from lower portion of incisionand now there is an open area on the top of the incision, this area was cultured.Wound/BCX drawn -> NGTD - CT stable  - Continue wound care per Dr Vantrigt/Dr Ulice Bold.  - Continue Keflex  4. Smoking: He stopped chantix. He has not smoked in over 2 weeks.   5. H/o RLE DVT: Holding coumadin for GIB/anemia.  - SCDs for VTE prophylaxis   6. OSA: Continue CPAP. Continue nightly . 7. Hyperlipidemia: Atorvastatin.  8. Type II diabetes:  - SSI   I reviewed the LVAD parameters from today, and compared the results to the patient's prior recorded data.  No programming changes were made.  The LVAD is functioning within specified parameters.  The patient performs LVAD self-test daily.  LVAD interrogation was negative for any significant power changes, alarms or PI events/speed drops.  LVAD equipment check completed and is in good working order.  Back-up equipment present.   LVAD education done on emergency procedures and precautions and reviewed exit site care.  Length of Stay: 3  Tonye Becket, NP 06/12/2020, 7:49 AM  VAD Team --- VAD ISSUES ONLY--- Pager [phone] (7am - 7am)  Advanced Heart Failure Team  Pager [phone] (M-F; 7a - 4p)  Please contact [facility] for night-coverage after hours (4p -7a ) and weekends on [email or web address]  Patient seen with NP, agree with the above note.   Stable today, hgb 7.4.  Has had 4 units PRBCs.  No BM yesterday. INR 1.3 with warfarin on hold.   General: Well appearing this am. NAD.  HEENT: Normal. Neck: Supple, JVP 7-8 cm. Carotids OK.  Cardiac:  Mechanical heart sounds with LVAD hum present.  Lungs:  CTAB, normal effort.  Abdomen:  NT, ND, no HSM. No  bruits or masses. +BS  LVAD exit site: Well-healed and incorporated. Dressing dry and intact. No erythema or drainage. Stabilization device present and accurately applied. Driveline dressing changed daily per sterile technique. Extremities:  Warm and dry. No cyanosis, clubbing, rash, or edema.  Neuro:  Alert & oriented x 3. Cranial nerves grossly intact. Moves all 4 extremities w/o difficulty. Affect pleasant    Plan for EGD with enteroscopy today.  Suspect  upper GI bleeding.  He will stay off ASA, I may lower his warfarin dose at home as well.   MAP 70s-80s, he can stay off BP-active meds for now.   Marca Ancona 06/12/2020 8:51 AM

## 2020-06-12 NOTE — Anesthesia Preprocedure Evaluation (Addendum)
Anesthesia Evaluation  Patient identified by MRN, date of birth, ID band Patient awake    Reviewed: Allergy & Precautions, NPO status , Patient's Chart, lab work & pertinent test results  Airway Mallampati: II  TM Distance: >3 FB Neck ROM: Full    Dental  (+) Teeth Intact   Pulmonary Current Smoker,    breath sounds clear to auscultation       Cardiovascular hypertension,  Rhythm:Regular Rate:Normal  Mechanical hum from LVAD   Neuro/Psych    GI/Hepatic   Endo/Other  diabetes  Renal/GU      Musculoskeletal   Abdominal   Peds  Hematology   Anesthesia Other Findings   Reproductive/Obstetrics                            Anesthesia Physical Anesthesia Plan  ASA: III  Anesthesia Plan: MAC   Post-op Pain Management:    Induction: Intravenous  PONV Risk Score and Plan: Ondansetron  Airway Management Planned: Natural Airway and Simple Face Mask  Additional Equipment:   Intra-op Plan:   Post-operative Plan:   Informed Consent: I have reviewed the patients History and Physical, chart, labs and discussed the procedure including the risks, benefits and alternatives for the proposed anesthesia with the patient or authorized representative who has indicated his/her understanding and acceptance.       Plan Discussed with: CRNA and Anesthesiologist  Anesthesia Plan Comments:        Anesthesia Quick Evaluation

## 2020-06-12 NOTE — Transfer of Care (Signed)
Immediate Anesthesia Transfer of Care Note  Patient: Theodore Welch  Procedure(s) Performed: ENTEROSCOPY (N/A ) HOT HEMOSTASIS (ARGON PLASMA COAGULATION/BICAP) (N/A )  Patient Location: Endoscopy Unit  Anesthesia Type:MAC  Level of Consciousness: awake, alert , oriented, patient cooperative and responds to stimulation  Airway & Oxygen Therapy: Patient Spontanous Breathing and Patient connected to nasal cannula oxygen  Post-op Assessment: Report given to RN, Post -op Vital signs reviewed and stable and Patient moving all extremities X 4  Post vital signs: Reviewed and stable  Last Vitals:  Vitals Value Taken Time  BP 82/62 06/12/20 1032  Temp 36.8 C 06/12/20 1030  Pulse 79 06/12/20 1032  Resp 22 06/12/20 1038  SpO2 100 % 06/12/20 1032  Vitals shown include unvalidated device data.  Last Pain:  Vitals:   06/12/20 1030  TempSrc: Oral  PainSc: 0-No pain         Complications: No complications documented.

## 2020-06-12 NOTE — Progress Notes (Signed)
Patient via wheelchair to Endoscopy 

## 2020-06-12 NOTE — Anesthesia Postprocedure Evaluation (Signed)
Anesthesia Post Note  Patient: Theodore Welch  Procedure(s) Performed: ENTEROSCOPY (N/A ) HOT HEMOSTASIS (ARGON PLASMA COAGULATION/BICAP) (N/A )     Patient location during evaluation: Endoscopy Anesthesia Type: MAC Level of consciousness: awake and alert Pain management: pain level controlled Vital Signs Assessment: post-procedure vital signs reviewed and stable Respiratory status: spontaneous breathing, nonlabored ventilation, respiratory function stable and patient connected to nasal cannula oxygen Cardiovascular status: stable and blood pressure returned to baseline Postop Assessment: no apparent nausea or vomiting Anesthetic complications: no   No complications documented.  Last Vitals:  Vitals:   06/12/20 1215 06/12/20 1335  BP: (!) 123/59 103/83  Pulse: 67   Resp: 20 23  Temp:    SpO2: 92%     Last Pain:  Vitals:   06/12/20 1050  TempSrc:   PainSc: 0-No pain                 Keiran Gaffey COKER

## 2020-06-12 NOTE — Op Note (Signed)
[facility] Patient Name: Theodore Welch Procedure Date : 06/12/2020 MRN: [id number] Attending MD: Doristine Locks , MD Date of Birth: December 02, 1960 CSN: [id number] Age: 59 Admit Type: Inpatient Procedure:                Small bowel enteroscopy Indications:              Acute post hemorrhagic anemia, Melena                           59 yo male with LVAD (on Coumadin) admitted with                            symptomatic anemia and melena, with Hgb 4.5 and                            elevatated INR on admission. Has received a total                            of 4U pRBCs with Hgb 7.4 this AM, and IRR now 1.3. Providers:                Doristine Locks, MD, Glory Rosebush, RN, Lawson Radar, Technician, Virgel Gess, CRNA Referring MD:             [name] Medicines:                Monitored Anesthesia Care Complications:            No immediate complications. Estimated Blood Loss:     Estimated blood loss was minimal. Procedure:                Pre-Anesthesia Assessment:                           - Prior to the procedure, a History and Physical                            was performed, and patient medications and                            allergies were reviewed. The patient's tolerance of                            previous anesthesia was also reviewed. The risks                            and benefits of the procedure and the sedation                            options and risks were discussed with the patient.                            All questions were answered, and informed consent  was obtained. Prior Anticoagulants: The patient has                            taken Coumadin (warfarin), last dose was 3 days                            prior to procedure. ASA Grade Assessment: III - A                            patient with severe systemic disease. After                            reviewing the risks and benefits,  the patient was                            deemed in satisfactory condition to undergo the                            procedure.                           After obtaining informed consent, the endoscope was                            passed under direct vision. Throughout the                            procedure, the patient's blood pressure, pulse, and                            oxygen saturations were monitored continuously. The                            PCF-H190DL ([id number] Olympus pediatric colonoscope                            was introduced through the mouth and advanced to                            the fourth part of duodenum. The small bowel                            enteroscopy was accomplished without difficulty.                            The patient tolerated the procedure well. Scope In: Scope Out: Findings:      The esophagus was normal.      The stomach was normal.      A single angioectasia with active, brisk bleeding was found in the       proximal second portion of the duodenum. This was adjacent to the minor       papilla. Coagulation for hemostasis using argon plasma was successful.      Normal mucosa was found in the duodenal bulb and in the fourth portion  of the duodenum.      Localized mucosal changes characterized by granularity were found in the       fourth portion of the duodenum. Biopsies were not performed due to       active bleeding in the small bowel. Impression:               - Normal esophagus.                           - Normal stomach.                           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                            Treated with argon plasma coagulation (APC).                           - Normal mucosa was found in the duodenal bulb and                            in the fourth portion of the duodenum.                           - Localized area of mucosal irregularity in the 4th                            portion of the  duodenum. Appearance could be                            consistent with small adenoma vs telangeictasia vs                            benign granularity. This was not biopsied due to                            the active small bowel bleeding already.                           - No specimens collected. Recommendation:           - Return patient to hospital ward for ongoing care.                           - Use Protonix (pantoprazole) 40 mg PO BID for 8                            weeks to promote clot formation and mucosal                            healing, then will reduce and potentially d/c                            altogther.                           -  Ok to resume full liquids now, and advance as                            tolerated tomorrow if no evidence of rebleed.                           - Continue holding Coumadin for now. If no                            rebleeding, can resume in 24-48 hours, but                            potentially at lower INR target per Cardiology                            service.                           - ASA held.                           - Could consider repeat enteroscopy in the future                            with biopsy of the noted area of mucosal                            irregularity in the 4th portion of the duodenum. Procedure Code(s):        --- Professional ---                           [phone], Small intestinal endoscopy, enteroscopy                            beyond second portion of duodenum, not including                            ileum; with control of bleeding (eg, injection,                            bipolar cautery, unipolar cautery, laser, heater                            probe, stapler, plasma coagulator) Diagnosis Code(s):        --- Professional ---                           K31.811, Angiodysplasia of stomach and duodenum                            with bleeding                           K31.89, Other diseases of  stomach and duodenum  D62, Acute posthemorrhagic anemia                           K92.1, Melena (includes Hematochezia) CPT copyright 2019 [employer]. All rights reserved. The codes documented in this report are preliminary and upon coder review may  be revised to meet current compliance requirements. Doristine Locks, MD 06/12/2020 10:38:57 AM Number of Addenda: 0

## 2020-06-12 NOTE — Interval H&P Note (Signed)
History and Physical Interval Note:  06/12/2020 9:27 AM  Theodore Welch  has presented today for surgery, with the diagnosis of GI bleed.  The various methods of treatment have been discussed with the patient and family. After consideration of risks, benefits and other options for treatment, the patient has consented to  Procedure(s): ENTEROSCOPY (N/A) as a surgical intervention.  The patient's history has been reviewed, patient examined, no change in status, stable for surgery.  I have reviewed the patient's chart and labs.  Questions were answered to the patient's satisfaction.     Theodore Welch

## 2020-06-12 NOTE — Progress Notes (Signed)
LVAD Coordinator Rounding Note:  Admitted 06/09/20 due to GIB and hgb of 4.5.   HM III LVAD implanted on 08/03/18 by Dr. Cyndia Bent under Destination Therapy criteria due to smoking status.  Met pt in endoscopy this morning for small bowel endoscopy. Pt did well with procedure. Pt was actively bleeding.  Vital signs: Temp: 98.3 HR: 78 Doppler Pressure: 78 Auto cuff: 94/81(87) O2 Sat: 96% on RA Wt: 189  lbs   LVAD interrogation reveals:  Speed: 5700 Flow: 4.4 Power: 4.4w PI: 3.5  Alarms: none Events: 70+ today Hematocrit: 23  Fixed speed: 5700 Low speed limit: 5400  Drive Line: Every other day dressing change per bedside RN.  Clean with betadine, no skin prep (skin intolerance). Take down outer portion of dressing. Leave Mepitel dressing in place. Cover with sterile gel and saline moistened 4 x 4 and sterile VAD dressing. Next dressing change due 06/13/20   Abdominal muscle flap incision: Abdominal incision with gauze dressing in place. Dressing removed. Small amount of serosanguinous drainage. Cleansed with betadine. Opened area on the bottom of incision tunnels 2 cm.  Cellurate applied to wound. Packed with Ca Alginate strip and covered w/saline soaked 2 x 2 and Ca alginate strip, gauze and tape.daily dressing changes to this area.       Labs:  LDH trend: 165  INR trend: 1.3  Anticoagulation Plan: -INR Goal: 2.0 - 2.5  -ASA Dose: stopped 06/09/20  Blood Products:  - 06/09/20- 3 u PRBCs - 06/11/20- 1 u PRBCs  Device: N/A    Infection:  - 04/16/20>>Covid positive - 04/16/20>>BCs staph aureus - 04/18/20>>sternal wound culture intra-op>>staph aureus - 04/20/20>>BCs negative  - 04/22/20>> COVID negative - 05/29/20>> abdominal muscle flap cutlure>> Rare Diphtheriods (cornebacterium species) - 06/09/20>> abdominal muscle flap cutlure>> Rare Diphtheriods (cornebacterium species)   Plan/Recommendations:  1. Call VAD Coordinator if any VAD equipment or drive line issues. 2.  Every other day drive line dressing change and daily abdominal dressing changes per bedside RN   Tanda Rockers RN [place] Coordinator  Office: [phone]  24/7 Pager: [phone]

## 2020-06-13 ENCOUNTER — Encounter (HOSPITAL_COMMUNITY): Payer: Self-pay | Admitting: Gastroenterology

## 2020-06-13 ENCOUNTER — Ambulatory Visit: Payer: Medicare HMO | Admitting: Plastic Surgery

## 2020-06-13 DIAGNOSIS — I420 Dilated cardiomyopathy: Secondary | ICD-10-CM | POA: Diagnosis not present

## 2020-06-13 DIAGNOSIS — K5521 Angiodysplasia of colon with hemorrhage: Secondary | ICD-10-CM | POA: Diagnosis not present

## 2020-06-13 DIAGNOSIS — Z7901 Long term (current) use of anticoagulants: Secondary | ICD-10-CM | POA: Diagnosis not present

## 2020-06-13 DIAGNOSIS — Z95811 Presence of heart assist device: Secondary | ICD-10-CM | POA: Diagnosis not present

## 2020-06-13 LAB — PROTIME-INR
INR: 1.1 (ref 0.8–1.2)
Prothrombin Time: 14.2 seconds (ref 11.4–15.2)

## 2020-06-13 LAB — BASIC METABOLIC PANEL
Anion gap: 5 (ref 5–15)
BUN: 14 mg/dL (ref 8–23)
CO2: 22 mmol/L (ref 22–32)
Calcium: 8.7 mg/dL — ABNORMAL LOW (ref 8.9–10.3)
Chloride: 108 mmol/L (ref 98–111)
Creatinine, Ser: 0.86 mg/dL (ref 0.61–1.24)
GFR calc Af Amer: >60 mL/min (ref >60)
GFR calc non Af Amer: >60 mL/min (ref >60)
Glucose, Bld: 144 mg/dL — ABNORMAL HIGH (ref 70–99)
Potassium: 4 mmol/L (ref 3.5–5.1)
Sodium: 135 mmol/L (ref 135–145)

## 2020-06-13 LAB — CBC WITH DIFFERENTIAL/PLATELET
Abs Immature Granulocytes: 0.05 10*3/uL (ref 0.00–0.07)
Basophils Absolute: 0.1 10*3/uL (ref 0.0–0.1)
Basophils Relative: 1 %
Eosinophils Absolute: 0.8 10*3/uL — ABNORMAL HIGH (ref 0.0–0.5)
Eosinophils Relative: 8 %
HCT: 31.5 % — ABNORMAL LOW (ref 39.0–52.0)
Hemoglobin: 9.9 g/dL — ABNORMAL LOW (ref 13.0–17.0)
Immature Granulocytes: 1 %
Lymphocytes Relative: 16 %
Lymphs Abs: 1.5 10*3/uL (ref 0.7–4.0)
MCH: 27.7 pg (ref 26.0–34.0)
MCHC: 31.4 g/dL (ref 30.0–36.0)
MCV: 88 fL (ref 80.0–100.0)
Monocytes Absolute: 0.9 10*3/uL (ref 0.1–1.0)
Monocytes Relative: 10 %
Neutro Abs: 6.2 10*3/uL (ref 1.7–7.7)
Neutrophils Relative %: 64 %
Platelets: 244 10*3/uL (ref 150–400)
RBC: 3.58 MIL/uL — ABNORMAL LOW (ref 4.22–5.81)
RDW: 15.6 % — ABNORMAL HIGH (ref 11.5–15.5)
WBC: 9.5 10*3/uL (ref 4.0–10.5)
nRBC: 0 % (ref 0.0–0.2)

## 2020-06-13 LAB — CBC
HCT: 21.4 % — ABNORMAL LOW (ref 39.0–52.0)
Hemoglobin: 6.6 g/dL — CL (ref 13.0–17.0)
MCH: 27.5 pg (ref 26.0–34.0)
MCHC: 30.8 g/dL (ref 30.0–36.0)
MCV: 89.2 fL (ref 80.0–100.0)
Platelets: 235 10*3/uL (ref 150–400)
RBC: 2.4 MIL/uL — ABNORMAL LOW (ref 4.22–5.81)
RDW: 15.9 % — ABNORMAL HIGH (ref 11.5–15.5)
WBC: 9.9 10*3/uL (ref 4.0–10.5)
nRBC: 0 % (ref 0.0–0.2)

## 2020-06-13 LAB — PREPARE RBC (CROSSMATCH)

## 2020-06-13 LAB — LACTATE DEHYDROGENASE: LDH: 148 U/L (ref 98–192)

## 2020-06-13 MED ORDER — SPIRONOLACTONE 12.5 MG HALF TABLET
12.5000 mg | ORAL_TABLET | Freq: Every day | ORAL | Status: DC
  Administered 2020-06-13 – 2020-06-18 (×6): 12.5 mg via ORAL
  Filled 2020-06-13 (×6): qty 1

## 2020-06-13 MED ORDER — SODIUM CHLORIDE 0.9% IV SOLUTION: Freq: Once | INTRAVENOUS | Status: AC

## 2020-06-13 MED ORDER — SODIUM CHLORIDE 0.9% IV SOLUTION: Freq: Once | INTRAVENOUS | Status: DC

## 2020-06-13 MED ORDER — SACUBITRIL-VALSARTAN 24-26 MG PO TABS
1.0000 | ORAL_TABLET | Freq: Two times a day (BID) | ORAL | Status: DC
  Administered 2020-06-13 – 2020-06-18 (×11): 1 via ORAL
  Filled 2020-06-13 (×11): qty 1

## 2020-06-13 NOTE — Plan of Care (Signed)
  Problem: Clinical Measurements: Goal: Ability to maintain clinical measurements within normal limits will improve Outcome: Progressing Goal: Will remain free from infection Outcome: Progressing Goal: Diagnostic test results will improve Outcome: Progressing Goal: Cardiovascular complication will be avoided Outcome: Progressing   Problem: Nutrition: Goal: Adequate nutrition will be maintained Outcome: Progressing   Problem: Coping: Goal: Level of anxiety will decrease Outcome: Progressing   Problem: Elimination: Goal: Will not experience complications related to bowel motility Outcome: Progressing Goal: Will not experience complications related to urinary retention Outcome: Progressing   Problem: Pain Managment: Goal: General experience of comfort will improve Outcome: Progressing   Problem: Safety: Goal: Ability to remain free from injury will improve Outcome: Progressing   Problem: Skin Integrity: Goal: Risk for impaired skin integrity will decrease Outcome: Progressing

## 2020-06-13 NOTE — Progress Notes (Addendum)
Advanced Heart Failure VAD Team Note  PCP-Cardiologist: No primary care provider on file.   Subjective:    Got 3u RBCs 7/23,  1u 7/25, & 1U 7/27    S/P EGD with bleeding angioectasia in duodenum.   Off warfarin INR 1.1  Hgb 7.5 -> 7.0 ->7.4->6.6    Frustrated. Wants something to eat.   LVAD INTERROGATION:  HeartMate 3 LVAD:   Flow 4.5 liters/min, speed 5700, power 4 W, PI 3.4 2.8. > 60 PI events. VAD interrogated personally. Parameters stable.  Objective:    Vital Signs:   Temp:  [97.9 F (36.6 C)-98.8 F (37.1 C)] 98.4 F (36.9 C) (07/27 0658) Pulse Rate:  [54-95] 70 (07/27 0658) Resp:  [16-23] 19 (07/27 0658) BP: (75-123)/(57-97) 119/97 (07/27 0658) SpO2:  [92 %-100 %] 100 % (07/27 0658) Weight:  [85.7 kg-85.8 kg] 85.8 kg (07/27 0250) Last BM Date: 06/12/20 Mean arterial Pressure 100s Intake/Output:   Intake/Output Summary (Last 24 hours) at 06/13/2020 0704 Last data filed at 06/13/2020 0656 Gross per 24 hour  Intake 1580 ml  Output 1000 ml  Net 580 ml     Physical Exam     GENERAL: No acute distress. Sitting in the chair HEENT: normal  NECK: Supple, JVP 5-6  .  2+ bilaterally, no bruits.  No lymphadenopathy or thyromegaly appreciated.   CARDIAC:  Mechanical heart sounds with LVAD hum present.  LUNGS:  Clear to auscultation bilaterally.  ABDOMEN:  Soft, round, nontender, positive bowel sounds x4.     LVAD exit site:  Dressing dry and intact.  No erythema or drainage.  Stabilization device present and accurately applied.   EXTREMITIES:  Warm and dry, no cyanosis, clubbing, rash or edema  NEUROLOGIC:  Alert and oriented x 3.    No aphasia.  No dysarthria.  Affect pleasant.       Telemetry   NSR 70s   Labs   Basic Metabolic Panel: Recent Labs  Lab 06/09/20 1047 06/09/20 1047 06/10/20 0343 06/10/20 0343 06/11/20 0233 06/12/20 0156 06/13/20 0111  NA 136  --  139  --  135 138 135  K 4.2  --  4.3  --  3.9 3.9 4.0  CL 107  --  110  --  107 109  108  CO2 15*  --  21*  --  21* 20* 22  GLUCOSE 156*  --  116*  --  142* 143* 144*  BUN 27*  --  19  --  19 19 14   CREATININE 1.18  --  0.93  --  0.85 1.16 0.86  CALCIUM 8.9   < > 9.0   < > 8.8* 9.1 8.7*   < > = values in this interval not displayed.    Liver Function Tests: Recent Labs  Lab 06/09/20 1048  AST 17  ALT 9  ALKPHOS 74  BILITOT 0.7  PROT 6.5  ALBUMIN 3.4*   No results for input(s): LIPASE, AMYLASE in the last 168 hours. No results for input(s): AMMONIA in the last 168 hours.  CBC: Recent Labs  Lab 06/09/20 1925 06/10/20 0343 06/11/20 0233 06/12/20 0156 06/13/20 0111  WBC 11.6* 8.2 10.2 10.5 9.9  HGB 6.7* 7.5* 7.0* 7.4* 6.6*  HCT 21.1* 24.2* 21.3* 23.4* 21.4*  MCV 89.8 88.6 87.7 87.3 89.2  PLT 231 249 248 233 235    INR: Recent Labs  Lab 06/09/20 1047 06/10/20 0343 06/11/20 0233 06/12/20 0156 06/13/20 0111  INR 3.2* 2.7* 2.0* 1.3* 1.1  Other results:     Imaging   No results found.   Medications:     Scheduled Medications: . sodium chloride   Intravenous Once  . sodium chloride   Intravenous Once  . cephALEXin  500 mg Oral QID  . gabapentin  600 mg Oral QID  . pantoprazole (PROTONIX) IV  40 mg Intravenous Q12H  . thiamine  100 mg Oral Daily  . zolpidem  5 mg Oral QHS    Infusions: . vancomycin 1,000 mg (06/12/20 1834)    PRN Medications: acetaminophen, ondansetron (ZOFRAN) IV, oxyCODONE   Assessment/Plan:    1. Acute GI bleeding with symptomatic anemia and hypotension - Hgb 4.5 on admit (down from 11 7/12). INR 3.2. CT a/p on 7/23 no RP bleed - BP improved after IVF bolus - 3u RBC on 7/23, 1u PRBC 7/25, and today 1U PRBCs  Hgb down to 6.6-  ASA stopped. Warfarin on hold. INR 1.1 - Continue protonix 40 mg twice a day x 8 weeks then reduce to daily.  - EGD with bleeding angioectasia in duodenum.  - Holding anticoagulation for 24-48 hours.  -GI appreciated.  - Will need octreotide as an outpatient.  2. Chronic  systolic CHF: Nonischemic cardiomyopathy, now s/p Heartmate 3 LVAD in 9/19.  - holding coumadin for active bleed/ supratherapeutic INR. - LDH Stable.  - CHF/ BP meds on hold. Maps higher today. Add 12.5 mg spironolactone daily.  - Should be transplant candidate eventually if he can quit smoking. - VAD interrogated personally. Parameters stable. 3. 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  - He completed IV abx course w/ cefazolin via PICC and rifabutin. Has been on long-term Keflex for MSSA suppression.  - Dressing changed 7/23. He has a small amount of serosanguinous drainage from lower portion of incisionand now there is an open area on the top of the incision, this area was cultured.Wound/BCX drawn -> NGTD - CT stable  - Continue wound care per Dr Vantrigt/Dr Ulice Bold.  - Switched from Keflex to vancomycin while inpatient for corynebacterium in wound.  4. Smoking: He stopped chantix. He has not smoked in over 2 weeks.   5. H/o RLE DVT: Holding coumadin for GIB/anemia.  - SCDs for VTE prophylaxis   6. OSA: Continue CPAP. Continue nightly . 7. Hyperlipidemia: Atorvastatin.  8. Type II diabetes:  - SSI   I reviewed the LVAD parameters from today, and compared the results to the patient's prior recorded data.  No programming changes were made.  The LVAD is functioning within specified parameters.  The patient performs LVAD self-test daily.  LVAD interrogation was negative for any significant power changes, alarms or PI events/speed drops.  LVAD equipment check completed and is in good working order.  Back-up equipment present.   LVAD education done on emergency procedures and precautions and reviewed exit site care.  Length of Stay: 4  Tonye Becket, NP 06/13/2020, 7:04 AM  VAD Team --- VAD ISSUES ONLY--- Pager [phone] (7am - 7am)  Advanced Heart Failure Team  Pager [phone] (M-F; 7a - 4p)  Please contact [facility] for night-coverage after hours (4p -7a ) and weekends on [email or web address]  Patient seen with NP, agree with the above note.    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  No further BM overnight, hgb 6.6 this morning.  Has had 1 unit PRBCs.  MAP 90s-100s.    General: Well appearing this am.  NAD.  HEENT: Normal. Neck: Supple, JVP 7-8 cm. Carotids OK.  Cardiac:  Mechanical heart sounds with LVAD hum present.  Lungs:  CTAB, normal effort.  Abdomen:  NT, ND, no HSM. No bruits or masses. +BS  LVAD exit site: Well-healed and incorporated. Dressing dry and intact. No erythema or drainage. Stabilization device present and accurately applied. Driveline dressing changed daily per sterile technique. Extremities:  Warm and dry. No cyanosis, clubbing, rash, or edema.  Neuro:  Alert & oriented x 3. Cranial nerves grossly intact. Moves all 4 extremities w/o difficulty. Affect pleasant    With hgb 6.6, will give total of 2 units PRBCs this morning.  Repeat CBC afterwards. He will stay off ASA.  I will lower INR goal to 1.8-2.3.  He will need octreotide as outpatient.   He has had Corynebacterium in wound cultures most recently.  Discussed with ID, will have on vancomycin while in hospital and linezolid course when goes home.   MAP rising, will restart spironolactone and Entresto at low dose, titrate up as needed.   Marca Ancona 06/13/2020 8:01 AM

## 2020-06-13 NOTE — Progress Notes (Signed)
LVAD Coordinator Rounding Note:  Admitted 06/09/20 due to GIB and hgb of 4.5.   HM III LVAD implanted on 08/03/18 by Dr. Cyndia Bent under Destination Therapy criteria due to smoking status.  Pt lying in bed this morning states that he did not sleep much last night due to blood draws and other nursing care. pts hgb down to 6.6 today.  Vital signs: Temp: 98.4 HR: 78 Doppler Pressure: 108 Auto cuff: 110/86(96) O2 Sat: 100% on RA Wt: 189  lbs   LVAD interrogation reveals:  Speed: 5700 Flow: 4.5 Power: 4.3w PI: 3.7  Alarms: none Events: 3 today Hematocrit: 21  Fixed speed: 5700 Low speed limit: 5400  Drive Line: Every other day dressing change per bedside RN.  Gauze dressing with anchor intact and accurately applied. Existing dressing removed using sterile technique. Site cleansed with betadine swab x 2 and allowed to dry. Cellerate applied to wound bed. Topped with saline moistened 2 x 2, mepitel and dry 4 x 4. Scant amount of thick, clear/yellow drainage noted. No foul odor or tenderness. Exit site with minimal tissue ingrown. Anchor changed. Recultured today. Every other day dressing changes using saline moinstened 2 x 2. Covered with driveline dressing kit sponge.  Next dressing change due 06/15/20    Abdominal muscle flap incision: Abdominal incision with gauze dressing in place. Dressing removed. Small amount of serosanguinous drainage. Cleansed with betadine. Opened area on the bottom of incision tunnels 2 cm.  Cellurate applied to wound. Packed with Ca Alginate strip and covered w/saline soaked 2 x 2 and Ca alginate strip, gauze and tape.Every other day dressing changes to this area.     Labs:  LDH trend: 165>148  INR trend: 1.3>1.1  Anticoagulation Plan: -INR Goal: 2.0 - 2.5  -ASA Dose: stopped 06/09/20  Blood Products:  - 06/09/20- 3 u PRBCs - 06/11/20- 1 u PRBCs  Device: N/A   Infection:  - 04/16/20>>Covid positive - 04/16/20>>BCs staph aureus - 04/18/20>>sternal  wound culture intra-op>>staph aureus - 04/20/20>>BCs negative  - 04/22/20>> COVID negative - 05/29/20>> abdominal muscle flap cutlure>> Rare Diphtheriods (cornebacterium species) - 06/09/20>> abdominal muscle flap cutlure>> Rare Diphtheriods (cornebacterium species) - 06/13/20>>culture driveline>>pending   Plan/Recommendations:  1. Call VAD Coordinator if any VAD equipment or drive line issues. 2. Every other day drive line dressing change and abdominal dressing changes per bedside RN   Tanda Rockers RN [place] Coordinator  Office: [phone]  24/7 Pager: [phone]

## 2020-06-13 NOTE — Progress Notes (Signed)
          Daily Rounding Note  06/13/2020, 12:46 PM  LOS: 4 days   SUBJECTIVE:   Chief complaint: Melena, blood loss anemia    No BM's for 2 d.  No abd pain, nausea. Tolerated liquids, then first solids at lunch.   Feels well, no SOB, weakness/dizziness  OBJECTIVE:         Vital signs in last 24 hours:    Temp:  [97.9 F (36.6 C)-98.8 F (37.1 C)] 98.1 F (36.7 C) (07/27 1241) Pulse Rate:  [54-80] 70 (07/27 1137) Resp:  [16-23] 16 (07/27 1241) BP: (93-123)/(73-97) 93/74 (07/27 1241) SpO2:  [97 %-100 %] 97 % (07/27 1137) Weight:  [85.8 kg] 85.8 kg (07/27 0250) Last BM Date: 06/12/20 Filed Weights   06/12/20 0339 06/12/20 0910 06/13/20 0250  Weight: 85.9 kg 85.7 kg 85.8 kg   General: looks well.  Alert and comfortable   Heart: VAD hum Chest: clear bil.   Abdomen: soft, NT, slightly obese.  Active normal BSDressings covering VAD driveline site are clean and dry.    Extremities: no CCE Neuro/Psych:  Pleasant, calm, fully alert and oriented.   No tremors.    Intake/Output from previous day: 07/26 0701 - 07/27 0700 In: 1580 [P.O.:600; I.V.:460; Blood:320; IV Piggyback:200] Out: 1000 [Urine:1000]  Intake/Output this shift: Total I/O In: 1035 [P.O.:240; I.V.:250; Blood:345; IV Piggyback:200] Out: -   Lab Results: Recent Labs    06/11/20 0233 06/12/20 0156 06/13/20 0111  WBC 10.2 10.5 9.9  HGB 7.0* 7.4* 6.6*  HCT 21.3* 23.4* 21.4*  PLT 248 233 235   BMET Recent Labs    06/11/20 0233 06/12/20 0156 06/13/20 0111  NA 135 138 135  K 3.9 3.9 4.0  CL 107 109 108  CO2 21* 20* 22  GLUCOSE 142* 143* 144*  BUN 19 19 14   CREATININE 0.85 1.16 0.86  CALCIUM 8.8* 9.1 8.7*   LFT No results for input(s): PROT, ALBUMIN, AST, ALT, ALKPHOS, BILITOT, BILIDIR, IBILI in the last 72 hours. PT/INR Recent Labs    06/12/20 0156 06/13/20 0111  LABPROT 15.8* 14.2  INR 1.3* 1.1    ASSESMENT:   *   Melena, blood loss  anemia in LVAD pt on Counadin.   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  Isolated area of irregularity in D4, ? Small adenoma vs AVM vs granularity; not biopsied due to active AVM bleed/APC Up to date on colonoscopies done at [facility], hx small colon polyps.   *  Blood loss anemia.  Hgb 4.5 >> >> 7.4 >> 6.6.  6 PRBCs thus far, 2 today.    *   Chronic coumadin on hold.  INR 3.2 >> 1.1.      PLAN   *   Protonix 40 mg po bid of 8 weeks, then reduce to daily dosing.    *   Resume Coumadin per LVAD team but could be as soon as later today or tomorrow.      [facility]  06/13/2020, 12:46 PM Phone [phone]

## 2020-06-14 DIAGNOSIS — Z7901 Long term (current) use of anticoagulants: Secondary | ICD-10-CM | POA: Diagnosis not present

## 2020-06-14 DIAGNOSIS — D62 Acute posthemorrhagic anemia: Secondary | ICD-10-CM | POA: Diagnosis not present

## 2020-06-14 DIAGNOSIS — I420 Dilated cardiomyopathy: Secondary | ICD-10-CM | POA: Diagnosis not present

## 2020-06-14 DIAGNOSIS — Z95811 Presence of heart assist device: Secondary | ICD-10-CM | POA: Diagnosis not present

## 2020-06-14 DIAGNOSIS — K5521 Angiodysplasia of colon with hemorrhage: Secondary | ICD-10-CM | POA: Diagnosis not present

## 2020-06-14 LAB — TYPE AND SCREEN
ABO/RH(D): O POS
Antibody Screen: NEGATIVE
Unit division: 0
Unit division: 0

## 2020-06-14 LAB — BASIC METABOLIC PANEL
Anion gap: 10 (ref 5–15)
BUN: 14 mg/dL (ref 8–23)
CO2: 22 mmol/L (ref 22–32)
Calcium: 9.2 mg/dL (ref 8.9–10.3)
Chloride: 107 mmol/L (ref 98–111)
Creatinine, Ser: 0.93 mg/dL (ref 0.61–1.24)
GFR calc Af Amer: >60 mL/min (ref >60)
GFR calc non Af Amer: >60 mL/min (ref >60)
Glucose, Bld: 87 mg/dL (ref 70–99)
Potassium: 4.4 mmol/L (ref 3.5–5.1)
Sodium: 139 mmol/L (ref 135–145)

## 2020-06-14 LAB — BPAM RBC
Blood Product Expiration Date: 202108272359
Blood Product Expiration Date: 202108282359
ISSUE DATE / TIME: 202107270412
ISSUE DATE / TIME: 202107271006
Unit Type and Rh: 5100
Unit Type and Rh: 5100

## 2020-06-14 LAB — CBC
HCT: 30.7 % — ABNORMAL LOW (ref 39.0–52.0)
Hemoglobin: 9.4 g/dL — ABNORMAL LOW (ref 13.0–17.0)
MCH: 27 pg (ref 26.0–34.0)
MCHC: 30.6 g/dL (ref 30.0–36.0)
MCV: 88.2 fL (ref 80.0–100.0)
Platelets: 237 10*3/uL (ref 150–400)
RBC: 3.48 MIL/uL — ABNORMAL LOW (ref 4.22–5.81)
RDW: 15.5 % (ref 11.5–15.5)
WBC: 12.3 10*3/uL — ABNORMAL HIGH (ref 4.0–10.5)
nRBC: 0 % (ref 0.0–0.2)

## 2020-06-14 LAB — PROTIME-INR
INR: 1.1 (ref 0.8–1.2)
Prothrombin Time: 14 seconds (ref 11.4–15.2)

## 2020-06-14 LAB — LACTATE DEHYDROGENASE: LDH: 201 U/L — ABNORMAL HIGH (ref 98–192)

## 2020-06-14 MED ORDER — DIPHENHYDRAMINE HCL 25 MG PO CAPS
25.0000 mg | ORAL_CAPSULE | Freq: Four times a day (QID) | ORAL | Status: DC | PRN
  Administered 2020-06-14 – 2020-06-18 (×8): 25 mg via ORAL
  Filled 2020-06-14 (×8): qty 1

## 2020-06-14 MED ORDER — WARFARIN - PHARMACIST DOSING INPATIENT
Freq: Every day | Status: DC
  Administered 2020-06-14: 1

## 2020-06-14 MED ORDER — BISACODYL 5 MG PO TBEC
5.0000 mg | DELAYED_RELEASE_TABLET | Freq: Every day | ORAL | Status: DC | PRN
  Administered 2020-06-14 – 2020-06-15 (×2): 5 mg via ORAL
  Filled 2020-06-14 (×2): qty 1

## 2020-06-14 MED ORDER — WARFARIN SODIUM 5 MG PO TABS
5.0000 mg | ORAL_TABLET | Freq: Once | ORAL | Status: AC
  Administered 2020-06-14: 5 mg via ORAL
  Filled 2020-06-14: qty 1

## 2020-06-14 NOTE — Progress Notes (Signed)
Patient ID: Theodore Welch, male   DOB: 10/08/1961, 59 y.o.   MRN: [id number]   Advanced Heart Failure VAD Team Note  PCP-Cardiologist: No primary care provider on file.   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    No further BRBPR/melena.    No complaints, walking in hall.  MAP stable 80s.   No labs yet.   LVAD INTERROGATION:  HeartMate 3 LVAD:   Flow 4.2 liters/min, speed 5700, power 4.3 W, PI 3.4.  Frequent PI events this morning. VAD interrogated personally. Parameters stable.  Objective:    Vital Signs:   Temp:  [97.9 F (36.6 C)-98.8 F (37.1 C)] 98.2 F (36.8 C) (07/28 0328) Pulse Rate:  [63-80] 63 (07/28 0328) Resp:  [15-23] 15 (07/28 0328) BP: (87-113)/(72-91) 97/82 (07/28 0328) SpO2:  [97 %-100 %] 100 % (07/28 0328) Weight:  [85.5 kg] 85.5 kg (07/28 0500) Last BM Date: 06/12/20 Mean arterial Pressure 80s Intake/Output:   Intake/Output Summary (Last 24 hours) at 06/14/2020 0807 Last data filed at 06/14/2020 0630 Gross per 24 hour  Intake 2195 ml  Output 1650 ml  Net 545 ml     Physical Exam    General: Well appearing this am. NAD.  HEENT: Normal. Neck: Supple, JVP 7-8 cm. Carotids OK.  Cardiac:  Mechanical heart sounds with LVAD hum present.  Lungs:  CTAB, normal effort.  Abdomen:  NT, ND, no HSM. No bruits or masses. +BS. Dressing on abdominal wall C/D/I.  LVAD exit site: Well-healed and incorporated. Dressing dry and intact. No erythema or drainage. Stabilization device present and accurately applied. Driveline dressing changed daily per sterile technique. Extremities:  Warm and dry. No cyanosis, clubbing, rash, or edema.  Neuro:  Alert & oriented x 3. Cranial nerves grossly intact. Moves all 4 extremities w/o difficulty. Affect pleasant      Telemetry   NSR 70s   Labs   Basic Metabolic Panel: Recent Labs  Lab 06/09/20 1047 06/09/20 1047 06/10/20 0343  06/10/20 0343 06/11/20 0233 06/12/20 0156 06/13/20 0111  NA 136  --  139  --  135 138 135  K 4.2  --  4.3  --  3.9 3.9 4.0  CL 107  --  110  --  107 109 108  CO2 15*  --  21*  --  21* 20* 22  GLUCOSE 156*  --  116*  --  142* 143* 144*  BUN 27*  --  19  --  19 19 14   CREATININE 1.18  --  0.93  --  0.85 1.16 0.86  CALCIUM 8.9   < > 9.0   < > 8.8* 9.1 8.7*   < > = values in this interval not displayed.    Liver Function Tests: Recent Labs  Lab 06/09/20 1048  AST 17  ALT 9  ALKPHOS 74  BILITOT 0.7  PROT 6.5  ALBUMIN 3.4*   No results for input(s): LIPASE, AMYLASE in the last 168 hours. No results for input(s): AMMONIA in the last 168 hours.  CBC: Recent Labs  Lab 06/10/20 0343 06/11/20 0233 06/12/20 0156 06/13/20 0111 06/13/20 1500  WBC 8.2 10.2 10.5 9.9 9.5  NEUTROABS  --   --   --   --  6.2  HGB 7.5* 7.0* 7.4* 6.6* 9.9*  HCT 24.2* 21.3* 23.4* 21.4* 31.5*  MCV 88.6 87.7 87.3 89.2 88.0  PLT 249 248 233 235 244    INR: Recent Labs  Lab 06/09/20 1047 06/10/20 0343 06/11/20 0233 06/12/20 0156 06/13/20 0111  INR 3.2* 2.7* 2.0* 1.3* 1.1    Other results:     Imaging   No results found.   Medications:     Scheduled Medications:  sodium chloride   Intravenous Once   sodium chloride   Intravenous Once   gabapentin  600 mg Oral QID   pantoprazole (PROTONIX) IV  40 mg Intravenous Q12H   sacubitril-valsartan  1 tablet Oral BID   spironolactone  12.5 mg Oral Daily   thiamine  100 mg Oral Daily   zolpidem  5 mg Oral QHS    Infusions:  vancomycin 1,000 mg (06/14/20 0612)    PRN Medications: acetaminophen, bisacodyl, diphenhydrAMINE, ondansetron (ZOFRAN) IV, oxyCODONE   Assessment/Plan:    1. Acute GI bleeding:  with symptomatic anemia and hypotension.  Hgb 4.5 on admit (down from 11 7/12). CT abd/pelvis on 7/23 no RP bleed. Has now had 5 units PRBCs.  EGD with bleeding AVM in duodenum treated with APC, also with possible adenoma distal  duodenum that was not addressed.  No further overt bleeding.  - Waiting for today's CBC. - Restart warfarin today, new INR goal 1.8-2.3.  Will stay off ASA.  - Possible resumption of heparin for LVAD tomorrow while INR low.  - Will need octreotide as an outpatient.  2. 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 LDH stable.  MAP 80s.  - Have restart spironolactone 12.5 and Entresto 24/26 bid.   - Should be transplant candidate eventually if he can quit smoking. - VAD interrogated personally. Parameters stable. 3. Recurrent driveline infection with subxiphoid abscess: Now s/p rectus flap coverage of wound site.  He completed IV abx course w/ cefazolin via PICC and rifabutin. Has been on long-term Keflex for MSSA suppression. He has had Corynebacterium in wound cultures most recently.   Dressing changed 7/23. He has a small amount of serosanguinous drainage from lower portion of incisionand now there is an open area on the top of the incision, this area was cultured.Wound/BCX drawn -> NGTD.  CT was stable at admission.  - Discussed with ID, will have on vancomycin while in hospital and linezolid course when goes home.   - Continue wound care per Dr Vantrigt/Dr Ulice Bold.  4. Smoking: He stopped chantix. He has not smoked in over 2 weeks.   5. H/o RLE DVT: Restarting warfarin.   - SCDs for VTE prophylaxis   6. OSA: Continue CPAP. Continue nightly . 7. Hyperlipidemia: Atorvastatin.  8. Type II diabetes:  - SSI   I reviewed the LVAD parameters from today, and compared the results to the patient's prior recorded data.  No programming changes were made.  The LVAD is functioning within specified parameters.  The patient performs LVAD self-test daily.  LVAD interrogation was negative for any significant power changes, alarms or PI events/speed drops.  LVAD equipment check completed and is in good working order.  Back-up equipment present.   LVAD education  done on emergency procedures and precautions and reviewed exit site care.  Length of Stay: 5  Marca Ancona, MD 06/14/2020, 8:07 AM  VAD Team --- VAD ISSUES ONLY--- Pager [phone] (7am - 7am)  Advanced Heart Failure Team  Pager [phone] (M-F; 7a - 4p)  Please contact [facility] for night-coverage after hours (4p -7a ) and weekends on [email or web address]

## 2020-06-14 NOTE — Progress Notes (Signed)
LVAD Coordinator Rounding Note:  Admitted 06/09/20 due to GIB and hgb of 4.5.   HM III LVAD implanted on 08/03/18 by Dr. Laneta Simmers under Destination Therapy criteria due to smoking status.  Pt lying in bed this morning states that he slept well last night with minimal interruptions. No BM overnight. Hgb yesterday evening 9.9  Vital signs: Temp: 98.4 HR: 78 Doppler Pressure: 108 Auto cuff: 110/86(96) O2 Sat: 100% on RA Wt: 189  lbs   LVAD interrogation reveals:  Speed: 5700 Flow: 4.5 Power: 4.3w PI: 3.7  Alarms: none Events: 3 today Hematocrit: 21  Fixed speed: 5700 Low speed limit: 5400  Drive Line: CDI.  Every other day dressing change per bedside RN.   Next dressing change due 06/15/20    Abdominal muscle flap incision: Every other day. Next dressing change is due 06/15/20. Abdominal incision with gauze dressing in place. Every other day dressing changes to this area.  Labs:  LDH trend: 165>148>201  INR trend: 1.3>1.1  Anticoagulation Plan: -INR Goal: 2.0 - 2.5  -ASA Dose: stopped 06/09/20  Blood Products:  - 06/09/20- 3 u PRBCs - 06/11/20- 1 u PRBCs - 06/13/20 - 2 u PRBCs  Device: N/A   Infection:  - 04/16/20>>Covid positive - 04/16/20>>BCs staph aureus - 04/18/20>>sternal wound culture intra-op>>staph aureus - 04/20/20>>BCs negative  - 04/22/20>> COVID negative - 05/29/20>> abdominal muscle flap cutlure>> Rare Diphtheriods (cornebacterium species) - 06/09/20>> abdominal muscle flap cutlure>> Rare Diphtheriods (cornebacterium species) - 06/13/20>>culture driveline>>pending   Plan/Recommendations:  1. Call VAD Coordinator if any VAD equipment or drive line issues. 2. Every other day drive line dressing change and abdominal dressing changes per bedside RN   [name] RN VAD Coordinator  Office: [phone]  24/7 Pager: [phone]

## 2020-06-14 NOTE — Progress Notes (Signed)
Daily Rounding Note  06/14/2020, 10:33 AM  LOS: 5 days   SUBJECTIVE:   Chief complaint:  Melena, blood loss anemia    Soft BP readings to 87/72 overnight, 104/73 this AM.  HR in 60s to 70s. Feels well Stool green/dark, not burgundy dark as before.    OBJECTIVE:         Vital signs in last 24 hours:    Temp:  [98.1 F (36.7 C)-98.8 F (37.1 C)] 98.7 F (37.1 C) (07/28 0716) Pulse Rate:  [63-80] 70 (07/28 0716) Resp:  [15-23] 20 (07/28 0716) BP: (87-113)/(72-91) 104/73 (07/28 0716) SpO2:  [97 %-100 %] 100 % (07/28 0716) Weight:  [85.5 kg] 85.5 kg (07/28 0500) Last BM Date: 06/12/20 Filed Weights   06/12/20 0910 06/13/20 0250 06/14/20 0500  Weight: 85.7 kg 85.8 kg 85.5 kg   General: looks well. Alert, NAD.  No labored breathing.  Comfortable Did not physically re-examine the pt      Intake/Output from previous day: 07/27 0701 - 07/28 0700 In: 2195 [P.O.:1200; I.V.:250; Blood:345; IV Piggyback:400] Out: 1650 [Urine:1650]  Intake/Output this shift: No intake/output data recorded.  Lab Results: Recent Labs    06/12/20 0156 06/13/20 0111 06/13/20 1500  WBC 10.5 9.9 9.5  HGB 7.4* 6.6* 9.9*  HCT 23.4* 21.4* 31.5*  PLT 233 235 244   BMET Recent Labs    06/12/20 0156 06/13/20 0111  NA 138 135  K 3.9 4.0  CL 109 108  CO2 20* 22  GLUCOSE 143* 144*  BUN 19 14  CREATININE 1.16 0.86  CALCIUM 9.1 8.7*   LFT No results for input(s): PROT, ALBUMIN, AST, ALT, ALKPHOS, BILITOT, BILIDIR, IBILI in the last 72 hours. PT/INR Recent Labs    06/12/20 0156 06/13/20 0111  LABPROT 15.8* 14.2  INR 1.3* 1.1   Hepatitis Panel No results for input(s): HEPBSAG, HCVAB, HEPAIGM, HEPBIGM in the last 72 hours.  Studies/Results: No results found.   Scheduled Meds: . sodium chloride   Intravenous Once  . sodium chloride   Intravenous Once  . gabapentin  600 mg Oral QID  . pantoprazole (PROTONIX) IV  40 mg  Intravenous Q12H  . sacubitril-valsartan  1 tablet Oral BID  . spironolactone  12.5 mg Oral Daily  . thiamine  100 mg Oral Daily  . zolpidem  5 mg Oral QHS   Continuous Infusions: . vancomycin 1,000 mg (06/14/20 0612)   PRN Meds:.acetaminophen, bisacodyl, diphenhydrAMINE, ondansetron (ZOFRAN) IV, oxyCODONE   ASSESMENT:   *   Melena, blood loss anemia in LVAD pt on Counadin.   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 Isolated area of irregularity in D4, ? Small adenoma vs AVM vs granularity; not biopsied due to active AVM bleed/APC Up to date on colonoscopies done at [facility], hx small colon polyps.   *  Blood loss anemia.   Hgb 4.5 >> 4 PRBCs >> 7.4 >> 6.6 >> 2 PRBC >> 9.4.   *   Chronic coumadin on hold.  INR 3.2 >> 1.1.     PLAN   *   Protonix 40 mg po bid of 8 weeks, then reduce to daily dosing.   *   OK to resume Coumadin  *  Note LVAD team plans for depot Octreotide and lower target range for INR 1.8 - 2.3  *   GI signing off.  No plans for GI office fup.    Jennye Moccasin  06/14/2020,  10:33 AM Phone [phone]

## 2020-06-14 NOTE — Plan of Care (Signed)
  Problem: Clinical Measurements: Goal: Will remain free from infection Outcome: Progressing Goal: Diagnostic test results will improve Outcome: Progressing Goal: Cardiovascular complication will be avoided Outcome: Progressing   Problem: Nutrition: Goal: Adequate nutrition will be maintained Outcome: Progressing   Problem: Coping: Goal: Level of anxiety will decrease Outcome: Progressing   Problem: Elimination: Goal: Will not experience complications related to bowel motility Outcome: Progressing Goal: Will not experience complications related to urinary retention Outcome: Progressing   Problem: Pain Managment: Goal: General experience of comfort will improve Outcome: Progressing   Problem: Safety: Goal: Ability to remain free from injury will improve Outcome: Progressing   Problem: Skin Integrity: Goal: Risk for impaired skin integrity will decrease Outcome: Progressing   

## 2020-06-14 NOTE — Progress Notes (Signed)
ANTICOAGULATION CONSULT NOTE - Initial Consult  Pharmacy Consult for warfarin Indication: LVAD  Allergies  Allergen Reactions  . Pineapple Hives  . Chlorhexidine Rash  . Other Rash    Prep pads    Patient Measurements: Height: 5\' 10"  (177.8 cm) Weight: 85.5 kg (188 lb 7.9 oz) IBW/kg (Calculated) : 73  Vital Signs: Temp: 98.7 F (37.1 C) (07/28 0716) Temp Source: Oral (07/28 0716) BP: 104/73 (07/28 0716) Pulse Rate: 70 (07/28 0716)  Labs: Recent Labs    06/12/20 0156 06/12/20 0156 06/13/20 0111 06/13/20 1500  HGB 7.4*   < > 6.6* 9.9*  HCT 23.4*  --  21.4* 31.5*  PLT 233  --  235 244  LABPROT 15.8*  --  14.2  --   INR 1.3*  --  1.1  --   CREATININE 1.16  --  0.86  --    < > = values in this interval not displayed.    Estimated Creatinine Clearance: 93.1 mL/min (by C-G formula based on SCr of 0.86 mg/dL).   Medical History: Past Medical History:  Diagnosis Date  . 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 Cardiomyopathy, unspecified (HCC)   . CHF (congestive heart failure) (HCC)   . Chronic back pain   . Diabetes mellitus without complication (HCC)   . Enlarged heart   . Gastric ulcer   . Gastroenteritis   . H/O degenerative disc disease   . Hypertension   . LVAD (left ventricular assist device) present [facility])      Assessment: 67 yoM with HM3 LVAD admitted with GIB s/p EGD and APC x1 7/26. Pharmacy asked to restart warfarin today. INR 1.1, CBC stable. Note, per 8/26 will lower INR goal to 1.8-2.3 (previously 2-2.5).  *Home warfarin dose 7.5mg  MWF, 5mg  TTSS  Goal of Therapy:  INR 1.8-2.3 Monitor platelets by anticoagulation protocol: Yes   Plan:  -Warfarin 5mg  x1 tonight -Daily protime   Shirlee Latch, PharmD, BCPS Clinical Pharmacist [phone] Please check AMION for all [facility] Pharmacy numbers 06/14/2020

## 2020-06-14 NOTE — Progress Notes (Signed)
Paged Theodore Welch coordinator regarding hives on left arm, order for benadryl rec'd and given,

## 2020-06-15 DIAGNOSIS — Z95811 Presence of heart assist device: Secondary | ICD-10-CM | POA: Diagnosis not present

## 2020-06-15 DIAGNOSIS — D62 Acute posthemorrhagic anemia: Secondary | ICD-10-CM | POA: Diagnosis not present

## 2020-06-15 LAB — CULTURE, BLOOD (ROUTINE X 2)
Culture: NO GROWTH
Culture: NO GROWTH
Special Requests: ADEQUATE
Special Requests: ADEQUATE

## 2020-06-15 LAB — BASIC METABOLIC PANEL
Anion gap: 9 (ref 5–15)
BUN: 16 mg/dL (ref 8–23)
CO2: 21 mmol/L — ABNORMAL LOW (ref 22–32)
Calcium: 8.8 mg/dL — ABNORMAL LOW (ref 8.9–10.3)
Chloride: 110 mmol/L (ref 98–111)
Creatinine, Ser: 0.96 mg/dL (ref 0.61–1.24)
GFR calc Af Amer: >60 mL/min (ref >60)
GFR calc non Af Amer: >60 mL/min (ref >60)
Glucose, Bld: 107 mg/dL — ABNORMAL HIGH (ref 70–99)
Potassium: 4.5 mmol/L (ref 3.5–5.1)
Sodium: 140 mmol/L (ref 135–145)

## 2020-06-15 LAB — AEROBIC CULTURE W GRAM STAIN (SUPERFICIAL SPECIMEN)
Culture: NORMAL
Gram Stain: NONE SEEN

## 2020-06-15 LAB — CBC
HCT: 28.3 % — ABNORMAL LOW (ref 39.0–52.0)
Hemoglobin: 8.9 g/dL — ABNORMAL LOW (ref 13.0–17.0)
MCH: 27.6 pg (ref 26.0–34.0)
MCHC: 31.4 g/dL (ref 30.0–36.0)
MCV: 87.9 fL (ref 80.0–100.0)
Platelets: 225 10*3/uL (ref 150–400)
RBC: 3.22 MIL/uL — ABNORMAL LOW (ref 4.22–5.81)
RDW: 15.2 % (ref 11.5–15.5)
WBC: 8.8 10*3/uL (ref 4.0–10.5)
nRBC: 0 % (ref 0.0–0.2)

## 2020-06-15 LAB — VANCOMYCIN, TROUGH: Vancomycin Tr: 9 ug/mL — ABNORMAL LOW (ref 15–20)

## 2020-06-15 LAB — PROTIME-INR
INR: 1.1 (ref 0.8–1.2)
Prothrombin Time: 13.5 seconds (ref 11.4–15.2)

## 2020-06-15 LAB — HEPARIN LEVEL (UNFRACTIONATED): Heparin Unfractionated: <0.1 IU/mL — ABNORMAL LOW (ref 0.30–0.70)

## 2020-06-15 LAB — LACTATE DEHYDROGENASE: LDH: 152 U/L (ref 98–192)

## 2020-06-15 MED ORDER — VANCOMYCIN HCL 1250 MG/250ML IV SOLN
1250.0000 mg | Freq: Two times a day (BID) | INTRAVENOUS | Status: DC
  Administered 2020-06-15 – 2020-06-18 (×6): 1250 mg via INTRAVENOUS
  Filled 2020-06-15 (×7): qty 250

## 2020-06-15 MED ORDER — HEPARIN (PORCINE) 25000 UT/250ML-% IV SOLN
500.0000 [IU]/h | INTRAVENOUS | Status: DC
  Administered 2020-06-15 – 2020-06-17 (×2): 500 [IU]/h via INTRAVENOUS
  Filled 2020-06-15 (×2): qty 250

## 2020-06-15 MED ORDER — WARFARIN SODIUM 7.5 MG PO TABS
7.5000 mg | ORAL_TABLET | Freq: Once | ORAL | Status: AC
  Administered 2020-06-15: 7.5 mg via ORAL
  Filled 2020-06-15: qty 1

## 2020-06-15 NOTE — Progress Notes (Signed)
ANTICOAGULATION & ANTIBIOTIC CONSULT NOTE  Pharmacy Consult:  Heparin + Vancomycin Indication: LVAD + Wound infection  Allergies  Allergen Reactions  . Chlorhexidine Rash  . Other Rash    Prep pads    Patient Measurements: Height: 5\' 10"  (177.8 cm) Weight: 86.8 kg (191 lb 5.8 oz) IBW/kg (Calculated) : 73  Vital Signs: Temp: 98.3 F (36.8 C) (07/29 1930) Temp Source: Oral (07/29 1930) BP: 99/82 (07/29 1930) Pulse Rate: 91 (07/29 1930)  Labs: Recent Labs    06/13/20 0111 06/13/20 0111 06/13/20 1500 06/13/20 1500 06/14/20 0920 06/15/20 0212 06/15/20 1926  HGB 6.6*   < > 9.9*   < > 9.4* 8.9*  --   HCT 21.4*   < > 31.5*  --  30.7* 28.3*  --   PLT 235   < > 244  --  237 225  --   LABPROT 14.2  --   --   --  14.0 13.5  --   INR 1.1  --   --   --  1.1 1.1  --   HEPARINUNFRC  --   --   --   --   --   --  <0.10*  CREATININE 0.86  --   --   --  0.93 0.96  --    < > = values in this interval not displayed.    Estimated Creatinine Clearance: 83.4 mL/min (by C-G formula based on SCr of 0.96 mg/dL).   Assessment: 10 yoM with HM3 LVAD admitted with GIB s/p EGD and APC x1 7/26. Pharmacy asked to restart warfarin and begin low-dose heparin bridge.  Heparin level is undetectable - no uptitration per MD.  No bleeding reported.   Pharmacy also consulted to manage vancomycin for wound infection.  Recently grew Corynebacterium in wound culture; culture negative here.  Renal function stable and vancomycin trough is slightly sub-therapeutic at 9 mcg/mL.  Afebrile, WBC normalized.  Vanc 7/26 >>  7/29 VT = 9 mcg/mL on 1g q12 >> incr to 1250mg  q12  7/23 covid / MRSA PCR - negative  Goal of Therapy:  INR 1.8-2.3 Monitor platelets by anticoagulation protocol: Yes Vanc trough 10-15 mcg/mL  Plan:  Continue heparin infusion at 500 units/hr - no titrations without discussion with MD F/U AM labs  Increase vanc to 1250mg  IV Q12H Monitor renal fxn, clinical progress, PRN vanc  level  Isley Weisheit D. , PharmD, BCPS, BCCCP 06/15/2020, 8:14 PM

## 2020-06-15 NOTE — Plan of Care (Signed)
  Problem: Clinical Measurements: Goal: Will remain free from infection Outcome: Progressing Goal: Diagnostic test results will improve Outcome: Progressing Goal: Cardiovascular complication will be avoided Outcome: Progressing   Problem: Nutrition: Goal: Adequate nutrition will be maintained Outcome: Progressing   Problem: Coping: Goal: Level of anxiety will decrease Outcome: Progressing   Problem: Elimination: Goal: Will not experience complications related to bowel motility Outcome: Progressing Goal: Will not experience complications related to urinary retention Outcome: Progressing   Problem: Pain Managment: Goal: General experience of comfort will improve Outcome: Progressing   Problem: Safety: Goal: Ability to remain free from injury will improve Outcome: Progressing   Problem: Skin Integrity: Goal: Risk for impaired skin integrity will decrease Outcome: Progressing   

## 2020-06-15 NOTE — Progress Notes (Signed)
ANTICOAGULATION CONSULT NOTE  Pharmacy Consult for warfarin Indication: LVAD  Allergies  Allergen Reactions  . Chlorhexidine Rash  . Other Rash    Prep pads    Patient Measurements: Height: 5\' 10"  (177.8 cm) Weight: 86.8 kg (191 lb 5.8 oz) IBW/kg (Calculated) : 73  Vital Signs: Temp: 98.2 F (36.8 C) (07/29 0418) Temp Source: Oral (07/29 0418) BP: 83/64 (07/29 0418) Pulse Rate: 74 (07/29 0418)  Labs: Recent Labs    06/13/20 0111 06/13/20 0111 06/13/20 1500 06/13/20 1500 06/14/20 0920 06/15/20 0212  HGB 6.6*   < > 9.9*   < > 9.4* 8.9*  HCT 21.4*   < > 31.5*  --  30.7* 28.3*  PLT 235   < > 244  --  237 225  LABPROT 14.2  --   --   --  14.0 13.5  INR 1.1  --   --   --  1.1 1.1  CREATININE 0.86  --   --   --  0.93 0.96   < > = values in this interval not displayed.    Estimated Creatinine Clearance: 83.4 mL/min (by C-G formula based on SCr of 0.96 mg/dL).   Medical History: Past Medical History:  Diagnosis Date  . 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 Cardiomyopathy, unspecified (HCC)   . CHF (congestive heart failure) (HCC)   . Chronic back pain   . Diabetes mellitus without complication (HCC)   . Enlarged heart   . Gastric ulcer   . Gastroenteritis   . H/O degenerative disc disease   . Hypertension   . LVAD (left ventricular assist device) present [facility])      Assessment: 69 yoM with HM3 LVAD admitted with GIB s/p EGD and APC x1 7/26. Pharmacy asked to restart warfarin and begin low-dose heparin bridge.  INR 1.1 as expected, CBC and LDH stable.  *Home warfarin dose 7.5mg  MWF, 5mg  TTSS  Goal of Therapy:  INR 1.8-2.3 Monitor platelets by anticoagulation protocol: Yes   Plan:  -Warfarin 7.5mg  x1 tonight -Heparin 500 units/h - no titrations without discussion with MD -Check 8hr heparin level -Daily protime and anti-Xa lvl   8/26, PharmD, BCPS Clinical Pharmacist [phone] Please check AMION for all [facility] Pharmacy numbers 06/15/2020

## 2020-06-15 NOTE — Progress Notes (Signed)
LVAD Coordinator Rounding Note:  Admitted 06/09/20 due to GIB and hgb of 4.5.   HM III LVAD implanted on 08/03/18 by Dr. Laneta Simmers under Destination Therapy criteria due to smoking status.  Pt sitting up in chair at bedside. Denies complaints. Nurse reports he has been up walking and has been in a good mood today. Pt asked to clarify strips being used on his abdominal dressings;   Vital signs: Temp: 98.3 HR: 88 Doppler Pressure: 84 Auto cuff: 118/93 (101) O2 Sat: 100% on RA Wt: 188.4>188>189.3>189>188>191  lbs   LVAD interrogation reveals:  Speed: 5700 Flow: 4.6 Power: 4.1w PI: 3.4  Alarms: pt on batteries Events: pt on batteries Hematocrit: 28  Fixed speed: 5700 Low speed limit: 5400  Drive Line: CDI.  Every other day dressing change per bedside RN.   Next dressing change due 06/15/20    Abdominal muscle flap incision: Every other day. Next dressing change is due 06/15/20. Abdominal incision with gauze dressing in place; use Ca Alginate strips in wounds, cover with saline soaked 2 x 2's, gauze, and tape. Every other day dressing changes to this area.  Labs:  LDH trend: 165>148>201>152  INR trend: 1.3>1.1>1.1  Anticoagulation Plan: -INR Goal: 2.0 - 2.5  -ASA Dose: stopped 06/09/20  Gtts: - Heparin>> 500 units/hr  Blood Products:  - 06/09/20- 3 u PRBCs - 06/11/20- 1 u PRBCs - 06/13/20 - 2 u PRBCs  Device: N/A   Infection:  - 04/16/20>>Covid positive - 04/16/20>>BCs staph aureus - 04/18/20>>sternal wound culture intra-op>>staph aureus - 04/20/20>>BCs negative  - 04/22/20>> COVID negative - 05/29/20>> abdominal muscle flap cutlure>> Rare Diphtheriods (cornebacterium species) - 06/09/20>> abdominal muscle flap cutlure>> Rare Diphtheriods (cornebacterium species) - 06/13/20>>culture driveline>>pending   Plan/Recommendations:  1. Call VAD Coordinator if any VAD equipment or drive line issues. 2. Every other day drive line dressing change and abdominal dressing changes per  bedside RN   Hessie Diener RN VAD Coordinator  Office: [phone]  24/7 Pager: [phone]

## 2020-06-15 NOTE — Plan of Care (Signed)
  Problem: Clinical Measurements: Goal: Will remain free from infection Outcome: Progressing Goal: Diagnostic test results will improve Outcome: Progressing Goal: Cardiovascular complication will be avoided Outcome: Progressing   Problem: Nutrition: Goal: Adequate nutrition will be maintained Outcome: Progressing   Problem: Elimination: Goal: Will not experience complications related to bowel motility Outcome: Progressing Goal: Will not experience complications related to urinary retention Outcome: Progressing   Problem: Safety: Goal: Ability to remain free from injury will improve Outcome: Progressing

## 2020-06-15 NOTE — Progress Notes (Signed)
Patient ID: Theodore Welch, male   DOB: 12/11/1960, 59 y.o.   MRN: [id number]   Advanced Heart Failure VAD Team Note  PCP-Cardiologist: No primary care provider on file.   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    No further BRBPR/melena, normal BM yesterday.    No complaints, walking in hall.  MAP stable 70s-80s.   LVAD INTERROGATION:  HeartMate 3 LVAD:   Flow 4.5 liters/min, speed 5700, power 4.4 W, PI 3.6.  VAD interrogated personally. Parameters stable.  Objective:    Vital Signs:   Temp:  [97.7 F (36.5 C)-98.7 F (37.1 C)] 98.2 F (36.8 C) (07/29 0418) Pulse Rate:  [70-91] 74 (07/29 0418) Resp:  [17-20] 18 (07/29 0418) BP: (83-97)/(63-85) 83/64 (07/29 0418) SpO2:  [98 %-100 %] 100 % (07/29 0418) Weight:  [86.8 kg] 86.8 kg (07/29 0418) Last BM Date: 06/14/20 Mean arterial Pressure 70s-80s Intake/Output:   Intake/Output Summary (Last 24 hours) at 06/15/2020 0734 Last data filed at 06/15/2020 0600 Gross per 24 hour  Intake 480 ml  Output 1325 ml  Net -845 ml     Physical Exam    General: Well appearing this am. NAD.  HEENT: Normal. Neck: Supple, JVP 7-8 cm. Carotids OK.  Cardiac:  Mechanical heart sounds with LVAD hum present.  Lungs:  CTAB, normal effort.  Abdomen:  NT, ND, no HSM. No bruits or masses. +BS  LVAD exit site: Well-healed and incorporated. Dressing dry and intact. No erythema or drainage. Stabilization device present and accurately applied. Driveline dressing changed daily per sterile technique. Extremities:  Warm and dry. No cyanosis, clubbing, rash, or edema.  Neuro:  Alert & oriented x 3. Cranial nerves grossly intact. Moves all 4 extremities w/o difficulty. Affect pleasant     Telemetry   NSR 70s   Labs   Basic Metabolic Panel: Recent Labs  Lab 06/11/20 0233 06/11/20 0233 06/12/20 0156 06/12/20 0156 06/13/20 0111 06/14/20 0920 06/15/20 0212    NA 135  --  138  --  135 139 140  K 3.9  --  3.9  --  4.0 4.4 4.5  CL 107  --  109  --  108 107 110  CO2 21*  --  20*  --  22 22 21*  GLUCOSE 142*  --  143*  --  144* 87 107*  BUN 19  --  19  --  14 14 16   CREATININE 0.85  --  1.16  --  0.86 0.93 0.96  CALCIUM 8.8*   < > 9.1   < > 8.7* 9.2 8.8*   < > = values in this interval not displayed.    Liver Function Tests: Recent Labs  Lab 06/09/20 1048  AST 17  ALT 9  ALKPHOS 74  BILITOT 0.7  PROT 6.5  ALBUMIN 3.4*   No results for input(s): LIPASE, AMYLASE in the last 168 hours. No results for input(s): AMMONIA in the last 168 hours.  CBC: Recent Labs  Lab 06/12/20 0156 06/13/20 0111 06/13/20 1500 06/14/20 0920 06/15/20 0212  WBC 10.5 9.9 9.5 12.3* 8.8  NEUTROABS  --   --  6.2  --   --   HGB 7.4* 6.6* 9.9* 9.4* 8.9*  HCT 23.4* 21.4* 31.5* 30.7* 28.3*  MCV 87.3 89.2 88.0 88.2 87.9  PLT 233 235 244 237 225    INR: Recent Labs  Lab 06/11/20 0233 06/12/20 0156 06/13/20 0111 06/14/20 0920 06/15/20 0212  INR 2.0* 1.3* 1.1 1.1 1.1    Other results:     Imaging   No results found.   Medications:     Scheduled Medications: . sodium chloride   Intravenous Once  . sodium chloride   Intravenous Once  . gabapentin  600 mg Oral QID  . pantoprazole (PROTONIX) IV  40 mg Intravenous Q12H  . sacubitril-valsartan  1 tablet Oral BID  . spironolactone  12.5 mg Oral Daily  . thiamine  100 mg Oral Daily  . Warfarin - Pharmacist Dosing Inpatient   Does not apply q1600  . zolpidem  5 mg Oral QHS    Infusions: . vancomycin 1,000 mg (06/15/20 0612)    PRN Medications: acetaminophen, bisacodyl, diphenhydrAMINE, ondansetron (ZOFRAN) IV, oxyCODONE   Assessment/Plan:    1. Acute GI bleeding:  with symptomatic anemia and hypotension.  Hgb 4.5 on admit (down from 11 7/12). CT abd/pelvis on 7/23 no RP bleed. Has now had 5 units PRBCs.  EGD with bleeding AVM in duodenum treated with APC, also with possible adenoma  distal duodenum that was not addressed.  No further overt bleeding, normal BM yesterday. Hgb 8.9 today.  INR 1.1.  - Restarted warfarin, new INR goal 1.8-2.3.  Will stay off ASA.  - Start low dose heparin gtt today.  - Will need octreotide as an outpatient.  2. 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 LDH stable.  MAP 70s-80s.  - Have restarted spironolactone 12.5 and Entresto 24/26 bid.   - Should be transplant candidate eventually if he can quit smoking. - VAD interrogated personally. Parameters stable. 3. Recurrent driveline infection with subxiphoid abscess: Now s/p rectus flap coverage of wound site.  He completed IV abx course w/ cefazolin via PICC and rifabutin. Has been on long-term Keflex for MSSA suppression. He has had Corynebacterium in wound cultures most recently.   Dressing changed 7/23. He has a small amount of serosanguinous drainage from lower portion of incisionand now there is an open area on the top of the incision, this area was cultured.Wound/BCX drawn -> NGTD.  CT was stable at admission.  - Discussed with ID, will have on vancomycin while in hospital and linezolid course when goes home.   - Continue wound care per Dr Vantrigt/Dr Ulice Bold.  4. Smoking: He stopped chantix. He has not smoked in over 2 weeks.   5. H/o RLE DVT: Restarting warfarin.   - SCDs for VTE prophylaxis   6. OSA: Continue CPAP. Continue nightly . 7. Hyperlipidemia: Atorvastatin.  8. Type II diabetes:  - SSI   Home when INR > 1.6.   I reviewed the LVAD parameters from today, and compared the results to the patient's prior recorded data.  No programming changes were made.  The LVAD is functioning within specified parameters.  The patient performs LVAD self-test daily.  LVAD interrogation was negative for any significant power changes, alarms or PI events/speed drops.  LVAD equipment check completed and is in good working order.  Back-up equipment  present.   LVAD education done on emergency procedures and precautions and reviewed exit site care.  Length of Stay: 6  Marca Ancona, MD 06/15/2020, 7:34 AM  VAD Team --- VAD ISSUES ONLY--- Pager [phone] (7am - 7am)  Advanced Heart Failure Team  Pager [phone] (M-F; 7a - 4p)  Please contact [facility] for night-coverage after hours (4p -7a ) and weekends on  [email or web address]

## 2020-06-16 DIAGNOSIS — K5521 Angiodysplasia of colon with hemorrhage: Secondary | ICD-10-CM | POA: Diagnosis not present

## 2020-06-16 DIAGNOSIS — D62 Acute posthemorrhagic anemia: Secondary | ICD-10-CM | POA: Diagnosis not present

## 2020-06-16 DIAGNOSIS — Z95811 Presence of heart assist device: Secondary | ICD-10-CM | POA: Diagnosis not present

## 2020-06-16 LAB — CBC
HCT: 28.4 % — ABNORMAL LOW (ref 39.0–52.0)
Hemoglobin: 8.7 g/dL — ABNORMAL LOW (ref 13.0–17.0)
MCH: 27.3 pg (ref 26.0–34.0)
MCHC: 30.6 g/dL (ref 30.0–36.0)
MCV: 89 fL (ref 80.0–100.0)
Platelets: 259 10*3/uL (ref 150–400)
RBC: 3.19 MIL/uL — ABNORMAL LOW (ref 4.22–5.81)
RDW: 14.8 % (ref 11.5–15.5)
WBC: 6.4 10*3/uL (ref 4.0–10.5)
nRBC: 0 % (ref 0.0–0.2)

## 2020-06-16 LAB — LACTATE DEHYDROGENASE: LDH: 159 U/L (ref 98–192)

## 2020-06-16 LAB — BASIC METABOLIC PANEL
Anion gap: 9 (ref 5–15)
BUN: 12 mg/dL (ref 8–23)
CO2: 20 mmol/L — ABNORMAL LOW (ref 22–32)
Calcium: 8.9 mg/dL (ref 8.9–10.3)
Chloride: 112 mmol/L — ABNORMAL HIGH (ref 98–111)
Creatinine, Ser: 0.89 mg/dL (ref 0.61–1.24)
GFR calc Af Amer: >60 mL/min (ref >60)
GFR calc non Af Amer: >60 mL/min (ref >60)
Glucose, Bld: 106 mg/dL — ABNORMAL HIGH (ref 70–99)
Potassium: 4.4 mmol/L (ref 3.5–5.1)
Sodium: 141 mmol/L (ref 135–145)

## 2020-06-16 LAB — PROTIME-INR
INR: 1.1 (ref 0.8–1.2)
Prothrombin Time: 13.7 seconds (ref 11.4–15.2)

## 2020-06-16 LAB — HEPARIN LEVEL (UNFRACTIONATED): Heparin Unfractionated: <0.1 IU/mL — ABNORMAL LOW (ref 0.30–0.70)

## 2020-06-16 MED ORDER — WARFARIN SODIUM 7.5 MG PO TABS
7.5000 mg | ORAL_TABLET | Freq: Once | ORAL | Status: AC
  Administered 2020-06-16: 7.5 mg via ORAL
  Filled 2020-06-16: qty 1

## 2020-06-16 NOTE — Progress Notes (Signed)
LVAD Coordinator Rounding Note:  Admitted 06/09/20 due to GIB and hgb of 4.5.   HM III LVAD implanted on 08/03/18 by Dr. Cyndia Bent under Destination Therapy criteria due to smoking status.  Pt sitting up in chair at bedside; no complaints. Says Dr. [name] says he can go home when his INR is therapeutic.   Pt back to bed for dressing change. Janene Madeira, NP with ID requested update on drainage/appearance of wounds. Colletta [place] updated: see below.   Vital signs: Temp: 98.6 HR: 66 Doppler Pressure:  84 Auto cuff:  91/76 (83) O2 Sat: 99% on RA Wt: 188.4>188>189.3>189>188>191>190 lbs   LVAD interrogation reveals:  Speed: 5700 Flow: 4.6 Power: 4.3w PI: 2.9  Alarms: none  Events: > 120 PI events today Hematocrit: 28  Fixed speed: 5700 Low speed limit: 5400  Drive Line: Every other day dressing change per bedside RN.  Gauze dressing with anchor intact and accurately applied. Existing dressing removed using sterile technique. Site cleansed with betadine swab x 2 and allowed to dry. Scant amount of thick, clear/yellow drainage noted. No foul odor or tenderness. Exit site with minimal tissue ingrowth. Saline soaked 2 x 2s placed around DL and covered with gauze and tape. No silver strip or skin prep uses. Anchor intact. Every other daydressing changes using saline moinstened 2 x 2. Covered with driveline dressing kit sponge.  Next dressing change due 06/18/20.     Abdominal muscle flap incision: Abdominal incision with gauze dressing in place. Dressing removed. Small amount of serosanguinous drainage.Cleansed with betadine. Opened area on the bottom of incision tunnels 2 cm. Packed with Ca Alginate strip and covered w/saline soaked 2 x 2 and Ca alginate strip, gauze and tape.Every other day dressing changes to this area per [facility] nurse. Next dressing change due 06/18/20.     Labs:  LDH trend: 165>148>201>152>159  INR trend: 1.3>1.1>1.1>1.1  Anticoagulation Plan: -INR Goal: 2.0 - 2.5    -ASA Dose: stopped 06/09/20  Gtts: - Heparin>> 500 units/hr  Blood Products:  - 06/09/20- 3 u PRBCs - 06/11/20- 1 u PRBCs - 06/13/20 - 2 u PRBCs  Device: N/A   Infection:  - 04/16/20>>Covid positive - 04/16/20>>BCs staph aureus - 04/18/20>>sternal wound culture intra-op>>staph aureus - 04/20/20>>BCs negative  - 04/22/20>> COVID negative - 05/29/20>> abdominal muscle flap cutlure>> Rare Diphtheriods (cornebacterium species) - 06/09/20>> abdominal muscle flap cutlure>> Rare Diphtheriods (cornebacterium species) - 06/13/20>>culture driveline>>pending   Plan/Recommendations:  1. Call VAD Coordinator if any VAD equipment or drive line issues. 2. Every other day drive line dressing change and abdominal dressing changes per bedside RN   Zada Girt RN [place] Coordinator  Office: [phone]  24/7 Pager: [phone]

## 2020-06-16 NOTE — Progress Notes (Signed)
ANTICOAGULATION CONSULT NOTE  Pharmacy Consult for heparin>warfarin Indication: LVAD  Allergies  Allergen Reactions   Chlorhexidine Rash   Other Rash    Prep pads    Patient Measurements: Height: 5\' 10"  (177.8 cm) Weight: 86.6 kg (190 lb 14.7 oz) IBW/kg (Calculated) : 73  Vital Signs: Temp: 98.6 F (37 C) (07/30 0814) Temp Source: Oral (07/30 0814) BP: 91/76 (07/30 0828) Pulse Rate: 76 (07/30 0305)  Labs: Recent Labs    06/14/20 0920 06/14/20 0920 06/15/20 0212 06/15/20 1926 06/16/20 0327  HGB 9.4*   < > 8.9*  --  8.7*  HCT 30.7*  --  28.3*  --  28.4*  PLT 237  --  225  --  259  LABPROT 14.0  --  13.5  --  13.7  INR 1.1  --  1.1  --  1.1  HEPARINUNFRC  --   --   --  <0.10* <0.10*  CREATININE 0.93  --  0.96  --  0.89   < > = values in this interval not displayed.    Estimated Creatinine Clearance: 90 mL/min (by C-G formula based on SCr of 0.89 mg/dL).   Medical History: Past Medical History:  Diagnosis Date   Abscess 01/2020   sternal abscess   Cardiomyopathy, unspecified (HCC)    CHF (congestive heart failure) (HCC)    Chronic back pain    Diabetes mellitus without complication (HCC)    Enlarged heart    Gastric ulcer    Gastroenteritis    H/O degenerative disc disease    Hypertension    LVAD (left ventricular assist device) present [facility])      Assessment: 33 yoM with HM3 LVAD admitted with GIB s/p EGD and APC x1 7/26. Pharmacy asked to restart warfarin and begin low-dose heparin bridge.  INR 1.1 still below goal as expected, CBC and LDH stable. No bleeding issues noted. Heparin started yesterday, level undetected also as expected, no titrations at this time.   *Home warfarin dose 7.5mg  MWF, 5mg  TTSS  Goal of Therapy:  Heparin level <0.3 - no titrations INR 1.8-2.3 Monitor platelets by anticoagulation protocol: Yes   Plan:  -Warfarin 7.5mg  x1 tonight -Heparin 500 units/h - no titrations without discussion with MD -Daily protime  and anti-Xa lvl  8/26 PharmD., BCPS Clinical Pharmacist 06/16/2020 11:25 AM

## 2020-06-16 NOTE — Progress Notes (Signed)
Patient ID: Theodore Welch, male   DOB: 03-Mar-1961, 59 y.o.   MRN: [id number]   Advanced Heart Failure VAD Team Note  PCP-Cardiologist: No primary care provider on file.   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    No further BRBPR/melena, normal BMs yesterday.  Hgb 8.9 => 8.7.   No complaints, walking in hall.  MAP stable 80s.   LVAD INTERROGATION:  HeartMate 3 LVAD:   Flow 4.6 liters/min, speed 5700, power 4.3 W, PI 3.0.  VAD interrogated personally. Parameters stable.  Objective:    Vital Signs:   Temp:  [97.9 F (36.6 C)-98.6 F (37 C)] 98 F (36.7 C) (07/30 0305) Pulse Rate:  [76-91] 76 (07/30 0305) Resp:  [17-23] 17 (07/30 0305) BP: (84-118)/(56-93) 89/56 (07/30 0305) SpO2:  [99 %-100 %] 99 % (07/30 0305) Weight:  [86.6 kg] 86.6 kg (07/30 0555) Last BM Date: 06/15/20 Mean arterial Pressure 80s Intake/Output:   Intake/Output Summary (Last 24 hours) at 06/16/2020 0741 Last data filed at 06/16/2020 0556 Gross per 24 hour  Intake 59.98 ml  Output 1525 ml  Net -1465.02 ml     Physical Exam    General: Well appearing this am. NAD.  HEENT: Normal. Neck: Supple, JVP 7-8 cm. Carotids OK.  Cardiac:  Mechanical heart sounds with LVAD hum present.  Lungs:  CTAB, normal effort.  Abdomen:  NT, ND, no HSM. No bruits or masses. +BS  LVAD exit site: Well-healed and incorporated. Dressing dry and intact. No erythema or drainage. Stabilization device present and accurately applied. Driveline dressing changed daily per sterile technique. Extremities:  Warm and dry. No cyanosis, clubbing, rash, or edema.  Neuro:  Alert & oriented x 3. Cranial nerves grossly intact. Moves all 4 extremities w/o difficulty. Affect pleasant     Telemetry   NSR 70s   Labs   Basic Metabolic Panel: Recent Labs  Lab 06/12/20 0156 06/12/20 0156 06/13/20 0111 06/13/20 0111 06/14/20 0920 06/15/20 0212  06/16/20 0327  NA 138  --  135  --  139 140 141  K 3.9  --  4.0  --  4.4 4.5 4.4  CL 109  --  108  --  107 110 112*  CO2 20*  --  22  --  22 21* 20*  GLUCOSE 143*  --  144*  --  87 107* 106*  BUN 19  --  14  --  14 16 12   CREATININE 1.16  --  0.86  --  0.93 0.96 0.89  CALCIUM 9.1   < > 8.7*   < > 9.2 8.8* 8.9   < > = values in this interval not displayed.    Liver Function Tests: Recent Labs  Lab 06/09/20 1048  AST 17  ALT 9  ALKPHOS 74  BILITOT 0.7  PROT 6.5  ALBUMIN 3.4*   No results for input(s): LIPASE, AMYLASE in the last 168 hours. No results for input(s): AMMONIA in the last 168 hours.  CBC: Recent Labs  Lab 06/13/20 0111 06/13/20 1500 06/14/20 0920 06/15/20 0212 06/16/20 0327  WBC 9.9 9.5 12.3* 8.8 6.4  NEUTROABS  --  6.2  --   --   --   HGB 6.6* 9.9* 9.4* 8.9* 8.7*  HCT 21.4* 31.5* 30.7* 28.3* 28.4*  MCV 89.2 88.0 88.2 87.9 89.0  PLT 235 244 237 225 259  INR: Recent Labs  Lab 06/12/20 0156 06/13/20 0111 06/14/20 0920 06/15/20 0212 06/16/20 0327  INR 1.3* 1.1 1.1 1.1 1.1    Other results:     Imaging   No results found.   Medications:     Scheduled Medications: . sodium chloride   Intravenous Once  . sodium chloride   Intravenous Once  . gabapentin  600 mg Oral QID  . pantoprazole (PROTONIX) IV  40 mg Intravenous Q12H  . sacubitril-valsartan  1 tablet Oral BID  . spironolactone  12.5 mg Oral Daily  . thiamine  100 mg Oral Daily  . Warfarin - Pharmacist Dosing Inpatient   Does not apply q1600  . zolpidem  5 mg Oral QHS    Infusions: . heparin 500 Units/hr (06/15/20 1900)  . vancomycin 1,250 mg (06/15/20 2049)    PRN Medications: acetaminophen, bisacodyl, diphenhydrAMINE, ondansetron (ZOFRAN) IV, oxyCODONE   Assessment/Plan:    1. Acute GI bleeding:  with symptomatic anemia and hypotension.  Hgb 4.5 on admit (down from 11 7/12). CT abd/pelvis on 7/23 no RP bleed. Has now had 5 units PRBCs.  EGD with bleeding AVM in  duodenum treated with APC, also with possible adenoma distal duodenum that was not addressed.  No further overt bleeding, normal BM yesterday. Hgb 8.9 => 8.7 today.  INR 1.1.  - Restarted warfarin, new INR goal 1.8-2.3.  Will stay off ASA.  - Continue low dose heparin gtt today.  - Will need octreotide as an outpatient.  - Check Fe studies.  2. 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 LDH stable.  MAP 80s.  - Have restarted spironolactone 12.5 and Entresto 24/26 bid.   - Should be transplant candidate eventually if he can quit smoking. - VAD interrogated personally. Parameters stable. 3. Recurrent driveline infection with subxiphoid abscess: Now s/p rectus flap coverage of wound site.  He completed IV abx course w/ cefazolin via PICC and rifabutin. Has been on long-term Keflex for MSSA suppression. He has had Corynebacterium in wound cultures most recently.   Dressing changed 7/23. He has a small amount of serosanguinous drainage from lower portion of incisionand now there is an open area on the top of the incision, this area was cultured.Wound/BCX drawn -> NGTD.  CT was stable at admission.  - Discussed with ID, will have on vancomycin while in hospital and linezolid course when goes home.   - Continue wound care per Dr Zenaida Niece Trigt/Dr Dillingham.  4. Smoking: He stopped chantix. He has not smoked in over 2 weeks.   5. H/o RLE DVT: Restarting warfarin.   - SCDs for VTE prophylaxis   6. OSA: Continue CPAP. Continue nightly . 7. Hyperlipidemia: Atorvastatin.  8. Type II diabetes:  - SSI   Home when INR > 1.6.   I reviewed the LVAD parameters from today, and compared the results to the patient's prior recorded data.  No programming changes were made.  The LVAD is functioning within specified parameters.  The patient performs LVAD self-test daily.  LVAD interrogation was negative for any significant power changes, alarms or PI events/speed drops.  LVAD  equipment check completed and is in good working order.  Back-up equipment present.   LVAD education done on emergency procedures and precautions and reviewed exit site care.  Length of Stay: 7  Marca Ancona, MD 06/16/2020, 7:41 AM  VAD Team --- VAD ISSUES ONLY--- Pager [phone] (7am - 7am)  Advanced Heart Failure Team  Pager [phone] (  M-F; 7a - 4p)  Please contact [facility] for night-coverage after hours (4p -7a ) and weekends on [email or web address]

## 2020-06-16 NOTE — Plan of Care (Signed)
  Problem: Clinical Measurements: Goal: Will remain free from infection Outcome: Progressing Goal: Diagnostic test results will improve Outcome: Progressing Goal: Cardiovascular complication will be avoided Outcome: Progressing   Problem: Nutrition: Goal: Adequate nutrition will be maintained Outcome: Progressing   Problem: Coping: Goal: Level of anxiety will decrease Outcome: Progressing   Problem: Elimination: Goal: Will not experience complications related to bowel motility Outcome: Progressing Goal: Will not experience complications related to urinary retention Outcome: Progressing   Problem: Pain Managment: Goal: General experience of comfort will improve Outcome: Progressing   Problem: Safety: Goal: Ability to remain free from injury will improve Outcome: Progressing   Problem: Skin Integrity: Goal: Risk for impaired skin integrity will decrease Outcome: Progressing   

## 2020-06-17 LAB — IRON AND TIBC
Iron: 13 ug/dL — ABNORMAL LOW (ref 45–182)
Saturation Ratios: 3 % — ABNORMAL LOW (ref 17.9–39.5)
TIBC: 431 ug/dL (ref 250–450)
UIBC: 418 ug/dL

## 2020-06-17 LAB — CBC
HCT: 28.9 % — ABNORMAL LOW (ref 39.0–52.0)
Hemoglobin: 8.6 g/dL — ABNORMAL LOW (ref 13.0–17.0)
MCH: 26.6 pg (ref 26.0–34.0)
MCHC: 29.8 g/dL — ABNORMAL LOW (ref 30.0–36.0)
MCV: 89.5 fL (ref 80.0–100.0)
Platelets: 245 10*3/uL (ref 150–400)
RBC: 3.23 MIL/uL — ABNORMAL LOW (ref 4.22–5.81)
RDW: 14.6 % (ref 11.5–15.5)
WBC: 6.6 10*3/uL (ref 4.0–10.5)
nRBC: 0 % (ref 0.0–0.2)

## 2020-06-17 LAB — BASIC METABOLIC PANEL
Anion gap: 7 (ref 5–15)
BUN: 11 mg/dL (ref 8–23)
CO2: 21 mmol/L — ABNORMAL LOW (ref 22–32)
Calcium: 8.9 mg/dL (ref 8.9–10.3)
Chloride: 109 mmol/L (ref 98–111)
Creatinine, Ser: 0.93 mg/dL (ref 0.61–1.24)
GFR calc Af Amer: >60 mL/min (ref >60)
GFR calc non Af Amer: >60 mL/min (ref >60)
Glucose, Bld: 182 mg/dL — ABNORMAL HIGH (ref 70–99)
Potassium: 4.1 mmol/L (ref 3.5–5.1)
Sodium: 137 mmol/L (ref 135–145)

## 2020-06-17 LAB — FERRITIN: Ferritin: 44 ng/mL (ref 24–336)

## 2020-06-17 LAB — PROTIME-INR
INR: 1.3 — ABNORMAL HIGH (ref 0.8–1.2)
Prothrombin Time: 15.7 seconds — ABNORMAL HIGH (ref 11.4–15.2)

## 2020-06-17 LAB — LACTATE DEHYDROGENASE: LDH: 180 U/L (ref 98–192)

## 2020-06-17 LAB — HEPARIN LEVEL (UNFRACTIONATED): Heparin Unfractionated: <0.1 IU/mL — ABNORMAL LOW (ref 0.30–0.70)

## 2020-06-17 MED ORDER — WARFARIN SODIUM 10 MG PO TABS
10.0000 mg | ORAL_TABLET | Freq: Once | ORAL | Status: AC
  Administered 2020-06-17: 10 mg via ORAL
  Filled 2020-06-17: qty 1

## 2020-06-17 MED ORDER — SODIUM CHLORIDE 0.9 % IV SOLN
510.0000 mg | Freq: Once | INTRAVENOUS | Status: AC
  Administered 2020-06-17: 510 mg via INTRAVENOUS
  Filled 2020-06-17: qty 17

## 2020-06-17 NOTE — Progress Notes (Signed)
ANTICOAGULATION CONSULT NOTE  Pharmacy Consult for heparin>warfarin Indication: LVAD  Allergies  Allergen Reactions  . Chlorhexidine Rash  . Other Rash    Prep pads    Patient Measurements: Height: 5\' 10"  (177.8 cm) Weight: 87 kg (191 lb 12.8 oz) IBW/kg (Calculated) : 73  Vital Signs: Temp: 99 F (37.2 C) (07/31 0744) Temp Source: Oral (07/31 0744) BP: 83/71 (07/31 0257) Pulse Rate: 71 (07/31 0257)  Labs: Recent Labs    06/15/20 0212 06/15/20 0212 06/15/20 1926 06/16/20 0327 06/17/20 0047  HGB 8.9*   < >  --  8.7* 8.6*  HCT 28.3*  --   --  28.4* 28.9*  PLT 225  --   --  259 245  LABPROT 13.5  --   --  13.7 15.7*  INR 1.1  --   --  1.1 1.3*  HEPARINUNFRC  --   --  <0.10* <0.10* <0.10*  CREATININE 0.96  --   --  0.89 0.93   < > = values in this interval not displayed.    Estimated Creatinine Clearance: 86.1 mL/min (by C-G formula based on SCr of 0.93 mg/dL).   Medical History: Past Medical History:  Diagnosis Date  . 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 Cardiomyopathy, unspecified (HCC)   . CHF (congestive heart failure) (HCC)   . Chronic back pain   . Diabetes mellitus without complication (HCC)   . Enlarged heart   . Gastric ulcer   . Gastroenteritis   . H/O degenerative disc disease   . Hypertension   . LVAD (left ventricular assist device) present [facility])      Assessment: Theodore Welch with HM3 LVAD admitted with GIB s/p EGD and APC x1 7/26. Pharmacy asked to restart warfarin and begin low-dose heparin bridge.  INR 1.3 still below goal but trending up, CBC and LDH stable. No bleeding issues noted. Heparin started 7/29, level undetected also as expected, no titrations at this time.   *Home warfarin dose 7.5mg  MWF, 5mg  TTSS  Goal of Therapy:  Heparin level <0.3 - no titrations INR 1.8-2.3 Monitor platelets by anticoagulation protocol: Yes   Plan:  -Warfarin 10mg  x1 tonight to help facilitate discharge then will back down on dosing -Heparin 500  units/h - no titrations without discussion with MD -Daily protime and anti-Xa lvl  8/29 PharmD., BCPS Clinical Pharmacist 06/17/2020 8:32 AM

## 2020-06-17 NOTE — Progress Notes (Signed)
Patient ID: Theodore Welch, male   DOB: 09/05/1961, 59 y.o.   MRN: [id number]   Advanced Heart Failure VAD Team Note  PCP-Cardiologist: No primary care provider on file.   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    No further BRBPR/melena, stool now brown.  Hgb 8.9 => 8.7 => 8.6.   No complaints, walking in hall.  MAP stable 80s.   LVAD INTERROGATION:  HeartMate 3 LVAD:   Flow 4.6 liters/min, speed 5700, power 4.3 W, PI 2.9.  VAD interrogated personally. Parameters stable.  Objective:    Vital Signs:   Temp:  [97.6 F (36.4 C)-99 F (37.2 C)] 99 F (37.2 C) (07/31 0744) Pulse Rate:  [71-98] 71 (07/31 0257) Resp:  [16-20] 20 (07/31 0745) BP: (83-116)/(64-90) 88/76 (07/31 0745) SpO2:  [98 %-99 %] 99 % (07/31 0257) Weight:  [87 kg] 87 kg (07/31 0624) Last BM Date: 06/17/20 Mean arterial Pressure 80s Intake/Output:   Intake/Output Summary (Last 24 hours) at 06/17/2020 1029 Last data filed at 06/17/2020 0821 Gross per 24 hour  Intake 159.78 ml  Output 1100 ml  Net -940.22 ml     Physical Exam    General: Well appearing this am. NAD.  HEENT: Normal. Neck: Supple, JVP 7-8 cm. Carotids OK.  Cardiac:  Mechanical heart sounds with LVAD hum present.  Lungs:  CTAB, normal effort.  Abdomen:  NT, ND, no HSM. No bruits or masses. +BS  LVAD exit site: Well-healed and incorporated. Dressing dry and intact. No erythema or drainage. Stabilization device present and accurately applied. Driveline dressing changed daily per sterile technique. Extremities:  Warm and dry. No cyanosis, clubbing, rash, or edema.  Neuro:  Alert & oriented x 3. Cranial nerves grossly intact. Moves all 4 extremities w/o difficulty. Affect pleasant    Telemetry   NSR 70s   Labs   Basic Metabolic Panel: Recent Labs  Lab 06/13/20 0111 06/13/20 0111 06/14/20 0920 06/14/20 0920 06/15/20 0212 06/16/20 0327  06/17/20 0047  NA 135  --  139  --  140 141 137  K 4.0  --  4.4  --  4.5 4.4 4.1  CL 108  --  107  --  110 112* 109  CO2 22  --  22  --  21* 20* 21*  GLUCOSE 144*  --  87  --  107* 106* 182*  BUN 14  --  14  --  16 12 11   CREATININE 0.86  --  0.93  --  0.96 0.89 0.93  CALCIUM 8.7*   < > 9.2   < > 8.8* 8.9 8.9   < > = values in this interval not displayed.    Liver Function Tests: No results for input(s): AST, ALT, ALKPHOS, BILITOT, PROT, ALBUMIN in the last 168 hours. No results for input(s): LIPASE, AMYLASE in the last 168 hours. No results for input(s): AMMONIA in the last 168 hours.  CBC: Recent Labs  Lab 06/13/20 1500 06/14/20 0920 06/15/20 0212 06/16/20 0327 06/17/20 0047  WBC 9.5 12.3* 8.8 6.4 6.6  NEUTROABS 6.2  --   --   --   --   HGB 9.9* 9.4* 8.9* 8.7* 8.6*  HCT 31.5* 30.7* 28.3* 28.4* 28.9*  MCV 88.0 88.2 87.9 89.0 89.5  PLT 244 237 225 259 245    INR: Recent Labs  Lab 06/13/20 0111 06/14/20 0920 06/15/20  8366 06/16/20 0327 06/17/20 0047  INR 1.1 1.1 1.1 1.1 1.3*    Other results:     Imaging   No results found.   Medications:     Scheduled Medications: . sodium chloride   Intravenous Once  . sodium chloride   Intravenous Once  . gabapentin  600 mg Oral QID  . pantoprazole (PROTONIX) IV  40 mg Intravenous Q12H  . sacubitril-valsartan  1 tablet Oral BID  . spironolactone  12.5 mg Oral Daily  . thiamine  100 mg Oral Daily  . warfarin  10 mg Oral ONCE-1600  . Warfarin - Pharmacist Dosing Inpatient   Does not apply q1600  . zolpidem  5 mg Oral QHS    Infusions: . ferumoxytol    . heparin 500 Units/hr (06/15/20 1900)  . vancomycin 1,250 mg (06/17/20 0819)    PRN Medications: acetaminophen, bisacodyl, diphenhydrAMINE, ondansetron (ZOFRAN) IV, oxyCODONE   Assessment/Plan:    1. Acute GI bleeding:  with symptomatic anemia and hypotension.  Hgb 4.5 on admit (down from 11 7/12). CT abd/pelvis on 7/23 no RP bleed. Has now had 5 units  PRBCs.  EGD with bleeding AVM in duodenum treated with APC, also with possible adenoma distal duodenum that was not addressed.  No further overt bleeding, normal BM yesterday. Hgb 8.9 => 8.7 => 8.6 today.  INR 1.3.  - Restarted warfarin, new INR goal 1.8-2.3.  Will stay off ASA.  - Continue low dose heparin gtt today.  - Will need octreotide as an outpatient.  - Fe deficient, will give Feraheme today.  2. 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 LDH stable.  MAP 80s.  - Have restarted spironolactone 12.5 and Entresto 24/26 bid.   - Should be transplant candidate eventually if he can quit smoking. - VAD interrogated personally. Parameters stable. 3. Recurrent driveline infection with subxiphoid abscess: Now s/p rectus flap coverage of wound site.  He completed IV abx course w/ cefazolin via PICC and rifabutin. Has been on long-term Keflex for MSSA suppression. He has had Corynebacterium in wound cultures most recently.   Dressing changed 7/23. He has a small amount of serosanguinous drainage from lower portion of incisionand now there is an open area on the top of the incision, this area was cultured.Wound/BCX drawn -> NGTD.  CT was stable at admission.  - Discussed with ID, will have on vancomycin while in hospital and linezolid course when goes home.   - Continue wound care per Dr Zenaida Niece Trigt/Dr Dillingham.  4. Smoking: He stopped chantix. He has not smoked in over 2 weeks.   5. H/o RLE DVT: Restarting warfarin.   - SCDs for VTE prophylaxis   6. OSA: Continue CPAP. Continue nightly . 7. Hyperlipidemia: Atorvastatin.  8. Type II diabetes:  - SSI   Home when INR > 1.6.   I reviewed the LVAD parameters from today, and compared the results to the patient's prior recorded data.  No programming changes were made.  The LVAD is functioning within specified parameters.  The patient performs LVAD self-test daily.  LVAD interrogation was negative for any  significant power changes, alarms or PI events/speed drops.  LVAD equipment check completed and is in good working order.  Back-up equipment present.   LVAD education done on emergency procedures and precautions and reviewed exit site care.  Length of Stay: 8  Marca Ancona, MD 06/17/2020, 10:29 AM  VAD Team --- VAD ISSUES ONLY--- Pager [phone] (7am - 7am)  Advanced Heart Failure Team  Pager [phone] (M-F; [place])  Please contact [facility] for night-coverage after hours (4p -7a ) and weekends on [email or web address]

## 2020-06-17 NOTE — Progress Notes (Signed)
Patient requests no lab draws until after 5am to "give coumadin time to work".  Phlebotomists notified.

## 2020-06-18 ENCOUNTER — Telehealth (HOSPITAL_COMMUNITY): Payer: Self-pay | Admitting: Unknown Physician Specialty

## 2020-06-18 LAB — PROTIME-INR
INR: 1.7 — ABNORMAL HIGH (ref 0.8–1.2)
Prothrombin Time: 19 seconds — ABNORMAL HIGH (ref 11.4–15.2)

## 2020-06-18 LAB — CBC
HCT: 28.6 % — ABNORMAL LOW (ref 39.0–52.0)
Hemoglobin: 8.6 g/dL — ABNORMAL LOW (ref 13.0–17.0)
MCH: 26.3 pg (ref 26.0–34.0)
MCHC: 30.1 g/dL (ref 30.0–36.0)
MCV: 87.5 fL (ref 80.0–100.0)
Platelets: 230 10*3/uL (ref 150–400)
RBC: 3.27 MIL/uL — ABNORMAL LOW (ref 4.22–5.81)
RDW: 14.5 % (ref 11.5–15.5)
WBC: 5.3 10*3/uL (ref 4.0–10.5)
nRBC: 0 % (ref 0.0–0.2)

## 2020-06-18 LAB — BASIC METABOLIC PANEL
Anion gap: 8 (ref 5–15)
BUN: 10 mg/dL (ref 8–23)
CO2: 21 mmol/L — ABNORMAL LOW (ref 22–32)
Calcium: 9.2 mg/dL (ref 8.9–10.3)
Chloride: 113 mmol/L — ABNORMAL HIGH (ref 98–111)
Creatinine, Ser: 0.88 mg/dL (ref 0.61–1.24)
GFR calc Af Amer: >60 mL/min (ref >60)
GFR calc non Af Amer: >60 mL/min (ref >60)
Glucose, Bld: 102 mg/dL — ABNORMAL HIGH (ref 70–99)
Potassium: 4.5 mmol/L (ref 3.5–5.1)
Sodium: 142 mmol/L (ref 135–145)

## 2020-06-18 LAB — LACTATE DEHYDROGENASE: LDH: 205 U/L — ABNORMAL HIGH (ref 98–192)

## 2020-06-18 LAB — HEPARIN LEVEL (UNFRACTIONATED): Heparin Unfractionated: <0.1 IU/mL — ABNORMAL LOW (ref 0.30–0.70)

## 2020-06-18 MED ORDER — CEPHALEXIN 500 MG PO CAPS
500.0000 mg | ORAL_CAPSULE | Freq: Four times a day (QID) | ORAL | 5 refills | Status: DC
Start: 2020-06-18 — End: 2021-02-05

## 2020-06-18 MED ORDER — PANTOPRAZOLE SODIUM 40 MG PO TBEC: 40.0000 mg | DELAYED_RELEASE_TABLET | Freq: Two times a day (BID) | ORAL | 2 refills | Status: DC

## 2020-06-18 MED ORDER — LINEZOLID 600 MG PO TABS: 600.0000 mg | ORAL_TABLET | Freq: Two times a day (BID) | ORAL | 0 refills | Status: AC

## 2020-06-18 MED ORDER — SILDENAFIL CITRATE 100 MG PO TABS: 50.0000 mg | ORAL_TABLET | Freq: Every day | ORAL | 6 refills | Status: DC | PRN

## 2020-06-18 MED ORDER — WARFARIN SODIUM 5 MG PO TABS: 5.0000 mg | ORAL_TABLET | Freq: Once | ORAL | Status: DC

## 2020-06-18 NOTE — Discharge Summary (Signed)
Discharge Summary    Patient ID: Theodore Welch MRN: [id number]; DOB: [birth year]  Admit date: 06/09/2020 Discharge date: 06/18/2020  Primary Care Provider: [name], MD  Primary Cardiologist: Marca Ancona, MD  Primary Electrophysiologist:  None   Discharge Diagnoses    Principal Problem:   GIB (gastrointestinal bleeding) Active Problems:   Type 2 diabetes mellitus (HCC)   Hyperlipidemia   LVAD (left ventricular assist device) present (HCC)   Abscess of sternal region   Melena   Acute blood loss anemia   Coagulopathy (HCC)   Chronic anticoagulation   Congestive dilated cardiomyopathy (HCC)   AVM (arteriovenous malformation) of small bowel, acquired with hemorrhage   Diagnostic Studies/Procedures    EGD 06/12/20 with bleeding angioectasia in duodenum => treated with APC  LVAD INTERROGATION:  HeartMate 3 LVAD:   Flow 4.1 liters/min, speed 5700, power 4.3 W, PI 5.  VAD interrogated personally. Parameters stable.  Most recent echo 01/26/20 1. Significantly limited study with very poor endocardial visualization.  2. Left ventricular ejection fraction, by estimation, is 20%. Left  ventricular ejection fraction by PLAX is 20 %. The left ventricle has  severely decreased function. Left ventricular endocardial border not  optimally defined to evaluate regional wall  motion. There is mild left ventricular hypertrophy. Left ventricular  diastolic parameters are indeterminate.  3. Right ventricular systolic function was not well visualized. The right  ventricular size is not well visualized.  4. The aortic valve was not well visualized. Aortic valve regurgitation  is not visualized.  5. The mitral valve was not well visualized. No evidence of mitral valve  regurgitation.  6. LV inflow cannula not visualized.    _____________   History of Present Illness     Theodore Welch is a 59 y.o. male with history of nonischemic cardiomyopathy, RLE, DVT, cirrhosis,  smoking, OSA, CHF/LVAD placement. Cardiomyopathy was diagnosed in 04/2018 in [place]. LHC/RHC at that time showed low output. He was admitted to [facility] in 04/2018 with low output HF and started on ilrinone and diuresis. Unable to wean off milrinone. He had a degree of RV failure, but this improved on milrinone. Valvular heart disease also looked better with with milrinone and diuresis. 08/03/2018 he had Heartmate 3 LVAD placed. Speed was optimized by ramp echo post-op. Post-op course was relatively unremarkable.   He was admitted 1/20 with MSSA driveline infection  He was admitted 03/2019 with recurrent MSSA driveline infection. NO abscess on CT. He was stated on iv abc for 6 weeks. BP active meds decreased with low MAP.  He was admitted in 01/2020 with subxiphoid abscess and cellulitis. CT showed collection along the course of the riveline. NO evidence of sternal osteomyelitis. Abscess was debrided, MSSA grew from wound culture. Wound vac was placed. Again started on cefazolin and stopped after 8 weeks.   Patient was readmitted 01/2020 with facial Zoster treated with acyclovir and this resolved.   Admitted 04/2020 with bleeding from the site of the subxiphoid abscess as well a cellulitis requiring multiple units of PRBCs. He went to OR initially for I&D. Blood wound cultures grew MSSA. Went back to the OR for rectus flap. Started on cefazolin and rifabutin, has PICC.   Seen in VAD clinic 05/2018 and not feeling well. Complained of weakness and decreased appetite. BP was low and noted increased PI events on vad interrogation. HE refused being stuck again to check CBC but labs prior week showed stable h/o at 11 (baseline). INR was 2.4. Hydralazine was reduced to  25mg  TID.   He presented back to the VAD clinic 06/09/20 and complained of worsening fatigue/weakness and new exertional dyspnea. He reported a syncopal episode that morning and had "blacked out" in the car while his wife was driving him to the appointment.  He reported melanotic stool some days prior and had since been constipated. In the clinic he was hypotensive w/ SBPs in the 70s and anemic Hgb of 4.2, INR 3.2. He was given 1L fluid bolus with improvement of SBPs to the 90s. He was admitted with symptomatic anemia and w/u for suspected GIB.   Hospital Course     Consultants: GI, Pharmacy  Acute GIB with symptomatic anemia and hypotension - Hgb 4.5 on admission which was down from 11 on 7/12. CT abd/pelvis 7/23 with no RP bleed. Received 5 units PRBCs total (3U 7/23, 1U 7/25, 2U 7/27). EGD with bleeding AVM in duodenum treated with APC, also with possible adenoma distal duodenum not addressed. Patient has not had further over bleeding, normal BM yesterday.  - Hgb 8.9>8.7>8.6. INR 1.7 today - Warfarin restarted, new INR goal 1.8-2.7.  - Plan to stay off Aspirin at discharge - heparin drip stopped given INR 1.7 - Iron deficient, had feraheme - Will send home on Protonix 40 mg BID x 8 weeks then back down to 40 mg daily - Plan to check CBC and INR this week  Chronic systolic CHF/Nonischemic cardiomyopathy  - s/p Heartmate 3 LVAD in 07/2018 - LDH stable, MAP 90s - restarted spironolactone 12.5mg  daily and Entresto. Will restart home dose of Entresto and spironolactone at discharge.  - VAD interrogated by Dr. 08/2018 and parameters stable - plan to follow-up in VAD clinic in 2 weeks  Recurrent driveline infection with subxiphoid abscess - now s/p rectus flap coverage of wound site - He previously completed IV abx course of cefazolin via PICC and has been on longterm Keflex for MSSA suppression - He had corynebacterium in wound recently.  - Dressing changed 7/23 and had small amount of serosanguinous drainage from lower portion of incision and now there is an open area at the top pf incision and blood/wound culture drawn with no growth to date.  - Case was discussed with ID who recommended vancomycin in the hospital and Linezolid course when goes  home. Plan for 6 days of Linezolid then back to Keflex 500 mg BID chronically - Further wound care per Dr. 8/23 Trigt/Dr. Dillingham - Follow-up CBC as above  Smoking - stopped Chantix and has not smoked in over 2 weeks  H/o of RLE/DVT - warfarin restarted with new INR goal as above - per pharmacy notes Warfarin 7.5mg  MWF and 5mg  TTSS.  - SCDs for prophylaxis during hospitalization - INR check this week  OSA - continue CPAP  Hyperlipidemia - Continue Atrovastatin  DM2 - SSI during admission. Restart home meds at discharge  Plan for follow-up in LVAD clinic in 2 weeks with CBC and INR later this week. Meds: warfarin with new INR goal 1.8-2.7, per pharmacy note Warfarin 7.5mg  MWF and 5mg  TTSS,  Linezolid 600mg  BID x 6 than restart chronic Keflex 500 mg BID QID, Entresto 49/51 BID, spironolactone 25mg  daily, protonix 40 mg BID x 8 weeks than back to 40 mg daily. Stop aspirin  The patient was evaluated by Dr. Zenaida Niece on 06/18/20 and felt to be stable for discharge.    Did the patient have an acute coronary syndrome (MI, NSTEMI, STEMI, etc) this admission?:  No  Did the patient have a percutaneous coronary intervention (stent / angioplasty)?:  No.   _____________  Discharge Vitals Blood pressure (!) 97/86, pulse 70, temperature 98.1 F (36.7 C), temperature source Oral, resp. rate 18, height 5\' 10"  (1.778 m), weight 87.7 kg, SpO2 100 %.  Filed Weights   06/16/20 0555 06/17/20 0624 06/18/20 0530  Weight: 86.6 kg 87 kg 87.7 kg    Labs & Radiologic Studies    CBC Recent Labs    06/17/20 0047 06/18/20 0523  WBC 6.6 5.3  HGB 8.6* 8.6*  HCT 28.9* 28.6*  MCV 89.5 87.5  PLT 245 230   Basic Metabolic Panel Recent Labs    08/18/20 0047 06/18/20 0523  NA 137 142  K 4.1 4.5  CL 109 113*  CO2 21* 21*  GLUCOSE 182* 102*  BUN 11 10  CREATININE 0.93 0.88  CALCIUM 8.9 9.2   Liver Function Tests No results for input(s): AST, ALT, ALKPHOS, BILITOT,  PROT, ALBUMIN in the last 72 hours. No results for input(s): LIPASE, AMYLASE in the last 72 hours. High Sensitivity Troponin:   No results for input(s): TROPONINIHS in the last 720 hours.  BNP Invalid input(s): POCBNP D-Dimer No results for input(s): DDIMER in the last 72 hours. Hemoglobin A1C No results for input(s): HGBA1C in the last 72 hours. Fasting Lipid Panel No results for input(s): CHOL, HDL, LDLCALC, TRIG, CHOLHDL, LDLDIRECT in the last 72 hours. Thyroid Function Tests No results for input(s): TSH, T4TOTAL, T3FREE, THYROIDAB in the last 72 hours.  Invalid input(s): FREET3 _____________  CT ABDOMEN PELVIS WO CONTRAST  Result Date: 06/09/2020 CLINICAL DATA:  Decreased hemoglobin and weakness with black tarry stools, initial encounter EXAM: CT ABDOMEN AND PELVIS WITHOUT CONTRAST TECHNIQUE: Multidetector CT imaging of the abdomen and pelvis was performed following the standard protocol without IV contrast. COMPARISON:  04/16/2020 FINDINGS: Lower chest: Minimal scarring is noted in the left lung base stable from the prior exam. LVAD is noted in the left chest. Previously seen soft tissue changes along the anterior chest wall are slightly more prominent and likely related to the sternal wound debridement and pectoralis flap. Hepatobiliary: No focal liver abnormality is seen. No gallstones, gallbladder wall thickening, or biliary dilatation. Pancreas: Unremarkable. No pancreatic ductal dilatation or surrounding inflammatory changes. Spleen: Normal in size without focal abnormality. Adrenals/Urinary Tract: Adrenal glands are within normal limits. Kidneys are well visualize without renal calculi. Stable right renal cyst is noted. No obstructive changes are seen. The bladder is well distended. Stomach/Bowel: Colon shows no obstructive or inflammatory changes. The appendix is within normal limits. No definitive colonic mass is seen. Mild diverticular changes noted without diverticulitis. The small  bowel and stomach appear within normal limits. Vascular/Lymphatic: Aortic atherosclerosis. No enlarged abdominal or pelvic lymph nodes. Reproductive: Prostate is unremarkable. Other: No abdominal wall hernia or abnormality. No abdominopelvic ascites. Small amount of air is noted in the anterior abdominal wall just to the right of the umbilicus with some localized inflammatory change. This may be related to the recent surgery. No fluid collection is identified. Musculoskeletal: No acute or significant osseous findings. IMPRESSION: LVAD in place without definitive complicating factors. Mild fluid attenuation in the anterior chest wall inferiorly increased from the prior exam likely related to the recent sternal surgery and reconstruction flap. Small amount of air in the anterior abdominal wall to the right of the umbilicus without definitive hematoma or fluid collection. This may be related to the recent surgery. The wound is open to the skin  site. No other focal abnormality is noted. Electronically Signed   By: Alcide Clever M.D.   On: 06/09/2020 14:53   Disposition   Pt is being discharged home today in good condition.  Follow-up Plans & Appointments     Follow-up Information    [facility] Follow up.   Specialty: Cardiology Why: The office will call to arrange follow-up with VAD clinic in 2 weeks with INR check and CBC this week.  Contact information: [address] [place] [address] [phone]               Discharge Medications   Allergies as of 06/18/2020      Reactions   Chlorhexidine Rash   Other Rash   Prep pads      Medication List    STOP taking these medications   aspirin EC 81 MG tablet     TAKE these medications   acetaminophen 500 MG tablet Commonly known as: TYLENOL Take 1,000 mg by mouth every 6 (six) hours as needed for mild pain.   carbamide peroxide 6.5 % OTIC solution Commonly  known as: DEBROX Place 5 drops into both ears 3 (three) times daily.   cephALEXin 500 MG capsule Commonly known as: KEFLEX Take 1 capsule (500 mg total) by mouth 4 (four) times daily. Restart medication 06/25/20 What changed: additional instructions   docusate sodium 100 MG capsule Commonly known as: COLACE Take 2 capsules (200 mg total) by mouth daily as needed for mild constipation.   fluticasone 50 MCG/ACT nasal spray Commonly known as: FLONASE Place 1 spray into both nostrils 2 (two) times daily as needed for allergies.   freestyle lancets Use as instructed   gabapentin 300 MG capsule Commonly known as: NEURONTIN Take 2 capsules (600 mg total) by mouth 4 (four) times daily.   glucose blood test strip Commonly known as: Contour Next Test Use as instructed   hydrALAZINE 50 MG tablet Commonly known as: APRESOLINE Take 0.5 tablets (25 mg total) by mouth 3 (three) times daily.   hydrOXYzine 50 MG tablet Commonly known as: ATARAX/VISTARIL Take 1 tablet (50 mg total) by mouth 3 (three) times daily as needed for itching.   Ipratropium-Albuterol 20-100 MCG/ACT Aers respimat Commonly known as: COMBIVENT Inhale 1 puff into the lungs every 6 (six) hours as needed for wheezing or shortness of breath.   linezolid 600 MG tablet Commonly known as: ZYVOX Take 1 tablet (600 mg total) by mouth 2 (two) times daily for 6 days.   nystatin powder Commonly known as: MYCOSTATIN/NYSTOP Apply 1 application topically 3 (three) times daily.   oxyCODONE 5 MG immediate release tablet Commonly known as: Oxy IR/ROXICODONE Take 1-2 tablets (5-10 mg total) by mouth every 4 (four) hours as needed for moderate pain.   pantoprazole 40 MG tablet Commonly known as: PROTONIX Take 1 tablet (40 mg total) by mouth 2 (two) times daily. Take 40 mg twice daily for 8 weeks (2 months) and then back down to 40 mg daily What changed:   when to take this  additional instructions   sacubitril-valsartan  49-51 MG Commonly known as: ENTRESTO Take 1 tablet by mouth 2 (two) times daily.   sildenafil 100 MG tablet Commonly known as: VIAGRA Take 0.5 tablets (50 mg total) by mouth daily as needed for erectile dysfunction.   spironolactone 25 MG tablet Commonly known as: ALDACTONE Take 1 tablet (25 mg total) by mouth daily.   thiamine 100  MG tablet Take 1 tablet (100 mg total) by mouth daily.   traMADol 50 MG tablet Commonly known as: ULTRAM Take 2 tablets (100 mg total) by mouth every 6 (six) hours as needed for severe pain.   vitamin B-12 500 MCG tablet Commonly known as: CYANOCOBALAMIN Take 1,000 mcg by mouth daily.   warfarin 5 MG tablet Commonly known as: COUMADIN Take as directed. If you are unsure how to take this medication, talk to your nurse or doctor. Original instructions: Take 7.5 mg (1.5 tab) MWF and 5 mg all other days or as directed by HF Clinic. What changed:   how much to take  how to take this  when to take this   Zinc Gluconate 100 MG Tabs Take 1 tablet (100 mg total) by mouth daily.   zolpidem 5 MG tablet Commonly known as: AMBIEN TAKE 1 TABLET BY MOUTH EVERY DAY AT BEDTIME          Outstanding Labs/Studies   CBC and INR check this week  Duration of Discharge Encounter   Greater than 30 minutes including physician time.  Signed, Bellarose Burtt David Stall, PA-C 06/18/2020, 11:07 AM

## 2020-06-18 NOTE — Telephone Encounter (Signed)
Pt requesting refill on viagra.  [name] RN, BSN VAD Coordinator 24/7 Pager [phone]

## 2020-06-18 NOTE — Progress Notes (Signed)
Patient ID: Theodore Welch, male   DOB: Jun 28, 1961, 59 y.o.   MRN: [id number]   Advanced Heart Failure VAD Team Note  PCP-Cardiologist: No primary care provider on file.   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    No further BRBPR/melena, stool now brown.  Hgb 8.9 => 8.7 => 8.6 => 8.6.   No complaints, walking in hall.  MAP mildly high now in 90s.   LVAD INTERROGATION:  HeartMate 3 LVAD:   Flow 4.1 liters/min, speed 5700, power 4.3 W, PI 5.  VAD interrogated personally. Parameters stable.  Objective:    Vital Signs:   Temp:  [98 F (36.7 C)-98.5 F (36.9 C)] 98 F (36.7 C) (08/01 0530) Pulse Rate:  [70-91] 70 (08/01 0530) Resp:  [16-20] 18 (08/01 0530) BP: (97-120)/(73-88) 97/86 (08/01 0720) SpO2:  [98 %-100 %] 100 % (08/01 0530) Weight:  [87.7 kg] 87.7 kg (08/01 0530) Last BM Date: 06/17/20 Mean arterial Pressure 90s Intake/Output:   Intake/Output Summary (Last 24 hours) at 06/18/2020 0946 Last data filed at 06/18/2020 0542 Gross per 24 hour  Intake 2395.56 ml  Output 1200 ml  Net 1195.56 ml     Physical Exam    General: Well appearing this am. NAD.  HEENT: Normal. Neck: Supple, JVP 7-8 cm. Carotids OK.  Cardiac:  Mechanical heart sounds with LVAD hum present.  Lungs:  CTAB, normal effort.  Abdomen:  NT, ND, no HSM. No bruits or masses. +BS  LVAD exit site: Well-healed and incorporated. Dressing dry and intact. No erythema or drainage. Stabilization device present and accurately applied. Driveline dressing changed daily per sterile technique. Extremities:  Warm and dry. No cyanosis, clubbing, rash, or edema.  Neuro:  Alert & oriented x 3. Cranial nerves grossly intact. Moves all 4 extremities w/o difficulty. Affect pleasant    Telemetry   NSR 70s   Labs   Basic Metabolic Panel: Recent Labs  Lab 06/14/20 0920 06/14/20 0920 06/15/20 0254 06/15/20 0212 06/16/20 0327  06/17/20 0047 06/18/20 0523  NA 139  --  140  --  141 137 142  K 4.4  --  4.5  --  4.4 4.1 4.5  CL 107  --  110  --  112* 109 113*  CO2 22  --  21*  --  20* 21* 21*  GLUCOSE 87  --  107*  --  106* 182* 102*  BUN 14  --  16  --  12 11 10   CREATININE 0.93  --  0.96  --  0.89 0.93 0.88  CALCIUM 9.2   < > 8.8*   < > 8.9 8.9 9.2   < > = values in this interval not displayed.    Liver Function Tests: No results for input(s): AST, ALT, ALKPHOS, BILITOT, PROT, ALBUMIN in the last 168 hours. No results for input(s): LIPASE, AMYLASE in the last 168 hours. No results for input(s): AMMONIA in the last 168 hours.  CBC: Recent Labs  Lab 06/13/20 1500 06/13/20 1500 06/14/20 0920 06/15/20 0212 06/16/20 0327 06/17/20 0047 06/18/20 0523  WBC 9.5   < > 12.3* 8.8 6.4 6.6 5.3  NEUTROABS 6.2  --   --   --   --   --   --   HGB 9.9*   < > 9.4* 8.9* 8.7* 8.6* 8.6*  HCT 31.5*   < > 30.7* 28.3*  28.4* 28.9* 28.6*  MCV 88.0   < > 88.2 87.9 89.0 89.5 87.5  PLT 244   < > 237 225 259 245 230   < > = values in this interval not displayed.    INR: Recent Labs  Lab 06/14/20 0920 06/15/20 0212 06/16/20 0327 06/17/20 0047 06/18/20 0523  INR 1.1 1.1 1.1 1.3* 1.7*    Other results:     Imaging   No results found.   Medications:     Scheduled Medications:  sodium chloride   Intravenous Once   sodium chloride   Intravenous Once   gabapentin  600 mg Oral QID   pantoprazole (PROTONIX) IV  40 mg Intravenous Q12H   sacubitril-valsartan  1 tablet Oral BID   spironolactone  12.5 mg Oral Daily   thiamine  100 mg Oral Daily   warfarin  5 mg Oral ONCE-1600   Warfarin - Pharmacist Dosing Inpatient   Does not apply q1600   zolpidem  5 mg Oral QHS    Infusions:  vancomycin 1,250 mg (06/18/20 0802)    PRN Medications: acetaminophen, bisacodyl, diphenhydrAMINE, ondansetron (ZOFRAN) IV, oxyCODONE   Assessment/Plan:    1. Acute GI bleeding:  with symptomatic anemia and  hypotension.  Hgb 4.5 on admit (down from 11 7/12). CT abd/pelvis on 7/23 no RP bleed. Has now had 5 units PRBCs.  EGD with bleeding AVM in duodenum treated with APC, also with possible adenoma distal duodenum that was not addressed.  No further overt bleeding, normal BM yesterday. Hgb 8.9 => 8.7 => 8.6 => 8.6 today.  INR 1.7.  - Restarted warfarin, new INR goal 1.8-2.3.  Will stay off ASA.  - With INR up to 1.7, can stop heparin gtt.  - Will need octreotide as an outpatient.  - Fe deficient, has had feraheme.   2. 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 LDH stable.  MAP 90s.  - Have restarted spironolactone 12.5 and Entresto 24/26 bid => can increase back to his home doses at discharge.   - Should be transplant candidate eventually if he can quit smoking. - VAD interrogated personally. Parameters stable. 3. Recurrent driveline infection with subxiphoid abscess: Now s/p rectus flap coverage of wound site.  He completed IV abx course w/ cefazolin via PICC and rifabutin. Has been on long-term Keflex for MSSA suppression. He has had Corynebacterium in wound cultures most recently.   Dressing changed 7/23. He has a small amount of serosanguinous drainage from lower portion of incisionand now there is an open area on the top of the incision, this area was cultured.Wound/BCX drawn -> NGTD.  CT was stable at admission.  - Discussed with ID, will have on vancomycin while in hospital and linezolid course when goes home (will need 6 days of linezolid then back to Keflex 500 qid chronically).   - Continue wound care per Dr Zenaida Niece Trigt/Dr Dillingham.  4. Smoking: He stopped chantix. He has not smoked in over 2 weeks.   5. H/o RLE DVT: Restarting warfarin.   - SCDs for VTE prophylaxis   6. OSA: Continue CPAP. Continue nightly . 7. Hyperlipidemia: Atorvastatin.  8. Type II diabetes:  - SSI  9. Disposition: Home today.  Needs followup in LVAD clinic in 2 wks and  CBC + INR later this week.  Meds for home: Warfarin with new INR goal 1.8-2.3, linezolid 600 mg bid x 6 days then restart Keflex 500 mg 4 times/day, Entresto 49/51 bid, spironolactone  25 daily, protonix 40 bid x 8 wks then back to 40 daily.  Stop ASA.   I reviewed the LVAD parameters from today, and compared the results to the patient's prior recorded data.  No programming changes were made.  The LVAD is functioning within specified parameters.  The patient performs LVAD self-test daily.  LVAD interrogation was negative for any significant power changes, alarms or PI events/speed drops.  LVAD equipment check completed and is in good working order.  Back-up equipment present.   LVAD education done on emergency procedures and precautions and reviewed exit site care.  Length of Stay: 42  Marca Ancona, MD 06/18/2020, 9:46 AM  VAD Team --- VAD ISSUES ONLY--- Pager [phone] (7am - 7am)  Advanced Heart Failure Team  Pager [phone] (M-F; 7a - 4p)  Please contact [facility] for night-coverage after hours (4p -7a ) and weekends on [email or web address]

## 2020-06-18 NOTE — Progress Notes (Addendum)
ANTICOAGULATION CONSULT NOTE  Pharmacy Consult for heparin>warfarin Indication: LVAD  Allergies  Allergen Reactions  . Chlorhexidine Rash  . Other Rash    Prep pads    Patient Measurements: Height: 5\' 10"  (177.8 cm) Weight: 87.7 kg (193 lb 5.5 oz) IBW/kg (Calculated) : 73  Vital Signs: Temp: 98 F (36.7 C) (08/01 0530) Temp Source: Oral (08/01 0530) BP: 97/85 (08/01 0530) Pulse Rate: 70 (08/01 0530)  Labs: Recent Labs    06/16/20 0327 06/16/20 0327 06/17/20 0047 06/18/20 0523  HGB 8.7*   < > 8.6* 8.6*  HCT 28.4*  --  28.9* 28.6*  PLT 259  --  245 230  LABPROT 13.7  --  15.7* 19.0*  INR 1.1  --  1.3* 1.7*  HEPARINUNFRC <0.10*  --  <0.10* <0.10*  CREATININE 0.89  --  0.93 0.88   < > = values in this interval not displayed.    Estimated Creatinine Clearance: 98.4 mL/min (by C-G formula based on SCr of 0.88 mg/dL).   Medical History: Past Medical History:  Diagnosis Date  . 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 Cardiomyopathy, unspecified (HCC)   . CHF (congestive heart failure) (HCC)   . Chronic back pain   . Diabetes mellitus without complication (HCC)   . Enlarged heart   . Gastric ulcer   . Gastroenteritis   . H/O degenerative disc disease   . Hypertension   . LVAD (left ventricular assist device) present [facility])      Assessment: 59 yoM with HM3 LVAD admitted with GIB s/p EGD and APC x1 7/26. Pharmacy asked to restart warfarin and begin low-dose heparin bridge.  INR up significantly after high dose warfarin last night, expect it to continue trending up now, CBC and LDH stable. No bleeding issues noted. Heparin started 7/29, level undetected also as expected, no titrations at this time.   *Home warfarin dose 7.5mg  MWF, 5mg  TTSS  Patient continues on vancomycin for wound infection. No fevers noted, wbc within normal limit. Plan is for zyvox at discharge (copay $1.30 per previous check). Will need to watch INR closely after discharge.   Goal of  Therapy:  Heparin level <0.3 - no titrations INR 1.8-2.3 Monitor platelets by anticoagulation protocol: Yes   Plan:  Warfarin 5mg  x1 tonight  Check INR at noon if 1.8 or great can stop heparin  Based on outpatient warfarin records and new INR goal will likely need to reduce outpatient dose to 5mg  daily initially especially with adding zyvox.   8/29 PharmD., BCPS Clinical Pharmacist 06/18/2020 7:33 AM

## 2020-06-18 NOTE — Care Management (Signed)
Spoke w patient to discuss DC meds. He states that he is already at the pharmacy and has paid for his meds including Zyvox. No other CM needs

## 2020-06-19 ENCOUNTER — Other Ambulatory Visit (HOSPITAL_COMMUNITY): Payer: Self-pay | Admitting: Unknown Physician Specialty

## 2020-06-19 DIAGNOSIS — Z95811 Presence of heart assist device: Secondary | ICD-10-CM

## 2020-06-19 DIAGNOSIS — Z7901 Long term (current) use of anticoagulants: Secondary | ICD-10-CM

## 2020-06-20 ENCOUNTER — Other Ambulatory Visit: Payer: Self-pay

## 2020-06-20 ENCOUNTER — Ambulatory Visit (HOSPITAL_COMMUNITY): Payer: Self-pay | Admitting: Pharmacist

## 2020-06-20 ENCOUNTER — Ambulatory Visit (HOSPITAL_COMMUNITY)
Admit: 2020-06-20 | Discharge: 2020-06-20 | Disposition: A | Discharge status: Home / Self Care | Payer: No Typology Code available for payment source | Source: Ambulatory Visit | Attending: Cardiology | Admitting: Cardiology

## 2020-06-20 DIAGNOSIS — Z95811 Presence of heart assist device: Secondary | ICD-10-CM | POA: Diagnosis present

## 2020-06-20 DIAGNOSIS — Z7901 Long term (current) use of anticoagulants: Secondary | ICD-10-CM | POA: Diagnosis not present

## 2020-06-20 LAB — CBC
HCT: 30.6 % — ABNORMAL LOW (ref 39.0–52.0)
Hemoglobin: 8.9 g/dL — ABNORMAL LOW (ref 13.0–17.0)
MCH: 26.2 pg (ref 26.0–34.0)
MCHC: 29.1 g/dL — ABNORMAL LOW (ref 30.0–36.0)
MCV: 90 fL (ref 80.0–100.0)
Platelets: 272 10*3/uL (ref 150–400)
RBC: 3.4 MIL/uL — ABNORMAL LOW (ref 4.22–5.81)
RDW: 15.1 % (ref 11.5–15.5)
WBC: 6.7 10*3/uL (ref 4.0–10.5)
nRBC: 0 % (ref 0.0–0.2)

## 2020-06-20 LAB — LACTATE DEHYDROGENASE: LDH: 178 U/L (ref 98–192)

## 2020-06-20 LAB — BASIC METABOLIC PANEL
Anion gap: 10 (ref 5–15)
BUN: 10 mg/dL (ref 8–23)
CO2: 20 mmol/L — ABNORMAL LOW (ref 22–32)
Calcium: 9 mg/dL (ref 8.9–10.3)
Chloride: 109 mmol/L (ref 98–111)
Creatinine, Ser: 1.02 mg/dL (ref 0.61–1.24)
GFR calc Af Amer: >60 mL/min (ref >60)
GFR calc non Af Amer: >60 mL/min (ref >60)
Glucose, Bld: 145 mg/dL — ABNORMAL HIGH (ref 70–99)
Potassium: 4.7 mmol/L (ref 3.5–5.1)
Sodium: 139 mmol/L (ref 135–145)

## 2020-06-20 LAB — PROTIME-INR
INR: 2 — ABNORMAL HIGH (ref 0.8–1.2)
Prothrombin Time: 22 seconds — ABNORMAL HIGH (ref 11.4–15.2)

## 2020-06-20 NOTE — Progress Notes (Signed)
Patient presents for wound care in [facility] today with his wife. Reports no problems with VAD equipment or concerns with drive line. Denies any falls, dizziness, lightheadedness, or syncope.   Pt was hospitalized 7/23-8/1 for GIB w/ hgb of 4.5. culture of right abdominal wall -  Muscle flap area growing diphtheroids. Pt sent home on Linezolid twice a day for 6 days. Pt given Vanc in the hospital. Culture of drive line negative.  See wound care notes below.  Abdominal incision care: Abdominal incision with gauze dressing in place packed w/Ca Alginate. Dressing removed. Small amount of serosanguinous drainage from lower portion of incision. Cleansed with betadine. Opened area on the bottom of incision tunnels 2 cm.  Cellurate applied to wound. Packed with plain packing strip and covered w/4 x 4 and tape. Wife instructed to change every other day or as needed when saturated. Pt given 14 daily dressing kits, saline flushes, and adhesive remover. The top portion of the sternal incision was left OTA.       Exit Site Care: Drive line is being maintained daily by [employer]. Gauze dressing with anchor intact and accurately applied.  Existing dressing removed using sterile technique. Site cleansed with betadine swab x 2 and allowed to dry. Cellerate applied to wound bed. Topped with saline moistened 2 x 2 and dry 4 x 4. Scant amount of thick, clear/yellow drainage noted. No foul odor or tenderness. Exit site with minimal tissue ingrown. Anchor changed. Continue every other day dressing changes using saline moinstened 2 x 2. Covered with driveline dressing kit sponge. Provided with 14 daily dressing kits, saline flushes, cotton tip applicators, and 2 x 2s for home use.        Device: N/A   BP & Labs:    Hgb 8.9 - No S/S of bleeding. Specifically denies melena/BRBPR or nosebleeds.   LDH stable at 201 with established baseline of 150 - 220. Denies tea-colored urine. No power elevations noted on  interrogation.    Patient Instructions: 1. Return to clinic in 1 week for dressing change and INR/CBC  Tanda Rockers RN [place] Coordinator  Office: [phone]  24/7 Pager: [phone]

## 2020-06-20 NOTE — Progress Notes (Signed)
LVAD INR 

## 2020-06-22 ENCOUNTER — Other Ambulatory Visit (HOSPITAL_COMMUNITY): Payer: Self-pay | Admitting: *Deleted

## 2020-06-22 DIAGNOSIS — Z95811 Presence of heart assist device: Secondary | ICD-10-CM

## 2020-06-22 DIAGNOSIS — Z7901 Long term (current) use of anticoagulants: Secondary | ICD-10-CM

## 2020-06-27 ENCOUNTER — Ambulatory Visit (HOSPITAL_COMMUNITY): Payer: Self-pay | Admitting: Pharmacist

## 2020-06-27 ENCOUNTER — Ambulatory Visit (HOSPITAL_COMMUNITY)
Admission: RE | Admit: 2020-06-27 | Discharge: 2020-06-27 | Disposition: A | Discharge status: Home / Self Care | Payer: No Typology Code available for payment source | Source: Ambulatory Visit | Attending: Cardiology | Admitting: Cardiology

## 2020-06-27 ENCOUNTER — Other Ambulatory Visit: Payer: Self-pay

## 2020-06-27 DIAGNOSIS — Z7901 Long term (current) use of anticoagulants: Secondary | ICD-10-CM | POA: Diagnosis not present

## 2020-06-27 DIAGNOSIS — Z95811 Presence of heart assist device: Secondary | ICD-10-CM

## 2020-06-27 LAB — CBC
HCT: 35.1 % — ABNORMAL LOW (ref 39.0–52.0)
Hemoglobin: 10.6 g/dL — ABNORMAL LOW (ref 13.0–17.0)
MCH: 27.6 pg (ref 26.0–34.0)
MCHC: 30.2 g/dL (ref 30.0–36.0)
MCV: 91.4 fL (ref 80.0–100.0)
Platelets: 239 10*3/uL (ref 150–400)
RBC: 3.84 MIL/uL — ABNORMAL LOW (ref 4.22–5.81)
RDW: 17.4 % — ABNORMAL HIGH (ref 11.5–15.5)
WBC: 5.5 10*3/uL (ref 4.0–10.5)
nRBC: 0 % (ref 0.0–0.2)

## 2020-06-27 LAB — PROTIME-INR
INR: 3.9 — ABNORMAL HIGH (ref 0.8–1.2)
Prothrombin Time: 36.8 seconds — ABNORMAL HIGH (ref 11.4–15.2)

## 2020-06-27 MED ORDER — WARFARIN SODIUM 5 MG PO TABS: ORAL_TABLET | ORAL | 3 refills | Status: DC

## 2020-06-27 NOTE — Progress Notes (Signed)
Patient presents for wound care in V[facility] Clinic today with his wife. Reports no problems with VAD equipment or concerns with drive line. Denies any falls, dizziness, lightheadedness, or syncope.   Pt reports he completed Linezolid twice a day for 6 days and is now taking Keflex.  See wound care notes below.  Abdominal incision care: Abdominal incision with gauze dressing in place packed w/Ca Alginate. Dressing removed. Small amount of serosanguinous drainage from lower portion of incision. Cleansed with betadine. Opened area on the bottom of incision tunnels 2 cm.  Cellurate applied to wound. Packed with plain packing strip and covered w/4 x 4 and tape. Wife instructed to change every other day or as needed when saturated.  Exit Site Care: Drive line is being maintained daily by [name]. Gauze dressing with anchor intact and accurately applied.  Existing dressing removed using sterile technique. Site cleansed with betadine swab x 2 and allowed to dry. Cellerate applied to wound bed. Topped with saline moistened 2 x 2 and dry 4 x 4. Scant amount of thick, clear/yellow drainage noted. No foul odor or tenderness. Exit site with minimal tissue ingrown. Anchor changed. Continue every other day dressing changes using saline moinstened 2 x 2. Covered with gauze dressing. Anchor replaced. Pt given 6 extra achors for home use.     Patient Instructions: 1. Return to clinic in 1 week for dressing change and INR  Hessie Diener RN VAD Coordinator  Office: [phone]  24/7 Pager: [phone]

## 2020-06-27 NOTE — Progress Notes (Signed)
LVAD INR 

## 2020-06-27 NOTE — Addendum Note (Signed)
Encounter addended by: Levonne Spiller, RN on: 06/27/2020 1:21 PM  Actions taken: Clinical Note Signed

## 2020-06-29 ENCOUNTER — Other Ambulatory Visit (HOSPITAL_COMMUNITY): Payer: Self-pay | Admitting: Unknown Physician Specialty

## 2020-06-29 DIAGNOSIS — Z95811 Presence of heart assist device: Secondary | ICD-10-CM

## 2020-06-29 DIAGNOSIS — Z7901 Long term (current) use of anticoagulants: Secondary | ICD-10-CM

## 2020-07-04 ENCOUNTER — Ambulatory Visit (HOSPITAL_COMMUNITY): Payer: Self-pay | Admitting: Pharmacist

## 2020-07-04 ENCOUNTER — Ambulatory Visit (HOSPITAL_COMMUNITY)
Admission: RE | Admit: 2020-07-04 | Discharge: 2020-07-04 | Disposition: A | Discharge status: Home / Self Care | Payer: No Typology Code available for payment source | Source: Ambulatory Visit | Attending: Internal Medicine | Admitting: Internal Medicine

## 2020-07-04 ENCOUNTER — Other Ambulatory Visit: Payer: Self-pay

## 2020-07-04 DIAGNOSIS — Z95811 Presence of heart assist device: Secondary | ICD-10-CM | POA: Insufficient documentation

## 2020-07-04 DIAGNOSIS — Z7901 Long term (current) use of anticoagulants: Secondary | ICD-10-CM | POA: Diagnosis not present

## 2020-07-04 LAB — PROTIME-INR
INR: 1.8 — ABNORMAL HIGH (ref 0.8–1.2)
Prothrombin Time: 20.4 seconds — ABNORMAL HIGH (ref 11.4–15.2)

## 2020-07-04 NOTE — Addendum Note (Signed)
Encounter addended by: Bernita Raisin, RN on: 07/04/2020 9:58 AM  Actions taken: Clinical Note Signed

## 2020-07-04 NOTE — Progress Notes (Signed)
LVAD INR 

## 2020-07-04 NOTE — Progress Notes (Signed)
Patient presents for wound care in V[facility] Clinic today with his wife. Reports no problems with VAD equipment or concerns with drive line. Denies any falls, dizziness, lightheadedness, or syncope.   Pt reports he completed Linezolid twice a day for 6 days and is now taking Keflex.  See wound care notes below.  Abdominal incision care: Abdominal incision with gauze dressing in place packed with plain packing gauze. Dressing removed. Small amount of serosanguinous drainage from lower portion of incision. Cleansed with betadine. Opened area at top of area tunnels 2.5 cm. Opened area on the bottom of incision tunnels 3 cm.  Cellurate applied to wound. Packed with plain packing strip and covered w/4 x 4 and tape. Wife instructed to change every other day or as needed when saturated.   Exit Site Care: Drive line is being maintained daily by [name]. Gauze dressing with anchor intact and accurately applied.  Existing dressing removed using sterile technique. Site cleansed with betadine swab x 2 and allowed to dry. Cellerate applied to wound bed. Topped with saline moistened 2 x 2 and dry 4 x 4. Scant amount of thick, clear/yellow drainage noted. No foul odor or tenderness. Exit site with minimal tissue ingrown. Anchor changed. Continue every other day dressing changes using saline moinstened 2 x 2. Covered with gauze dressing. Anchor intact. Patient has adequate dressing supplies at home.    Patient Instructions: 1. Return to clinic in 1 week for full visit.   Alyce Pagan RN VAD Coordinator  Office: [phone]  24/7 Pager: [phone]

## 2020-07-10 ENCOUNTER — Other Ambulatory Visit (HOSPITAL_COMMUNITY): Payer: Self-pay | Admitting: Unknown Physician Specialty

## 2020-07-10 DIAGNOSIS — Z7901 Long term (current) use of anticoagulants: Secondary | ICD-10-CM

## 2020-07-10 DIAGNOSIS — Z95811 Presence of heart assist device: Secondary | ICD-10-CM

## 2020-07-11 ENCOUNTER — Encounter: Payer: Self-pay | Admitting: Infectious Diseases

## 2020-07-11 ENCOUNTER — Ambulatory Visit (INDEPENDENT_AMBULATORY_CARE_PROVIDER_SITE_OTHER): Payer: No Typology Code available for payment source | Admitting: Infectious Diseases

## 2020-07-11 ENCOUNTER — Ambulatory Visit (HOSPITAL_COMMUNITY): Payer: Self-pay | Admitting: Pharmacist

## 2020-07-11 ENCOUNTER — Other Ambulatory Visit: Payer: Self-pay

## 2020-07-11 ENCOUNTER — Other Ambulatory Visit (HOSPITAL_COMMUNITY): Payer: Self-pay | Admitting: Unknown Physician Specialty

## 2020-07-11 ENCOUNTER — Ambulatory Visit (HOSPITAL_COMMUNITY)
Admit: 2020-07-11 | Discharge: 2020-07-11 | Disposition: A | Discharge status: Home / Self Care | Payer: No Typology Code available for payment source | Attending: Internal Medicine | Admitting: Internal Medicine

## 2020-07-11 VITALS — BP 100/0 | HR 92

## 2020-07-11 DIAGNOSIS — E785 Hyperlipidemia, unspecified: Secondary | ICD-10-CM | POA: Diagnosis not present

## 2020-07-11 DIAGNOSIS — T827XXA Infection and inflammatory reaction due to other cardiac and vascular devices, implants and grafts, initial encounter: Secondary | ICD-10-CM

## 2020-07-11 DIAGNOSIS — I5022 Chronic systolic (congestive) heart failure: Secondary | ICD-10-CM

## 2020-07-11 DIAGNOSIS — F17200 Nicotine dependence, unspecified, uncomplicated: Secondary | ICD-10-CM | POA: Insufficient documentation

## 2020-07-11 DIAGNOSIS — I420 Dilated cardiomyopathy: Secondary | ICD-10-CM | POA: Diagnosis not present

## 2020-07-11 DIAGNOSIS — K7460 Unspecified cirrhosis of liver: Secondary | ICD-10-CM | POA: Diagnosis not present

## 2020-07-11 DIAGNOSIS — K5521 Angiodysplasia of colon with hemorrhage: Secondary | ICD-10-CM

## 2020-07-11 DIAGNOSIS — Z79899 Other long term (current) drug therapy: Secondary | ICD-10-CM | POA: Insufficient documentation

## 2020-07-11 DIAGNOSIS — Z8619 Personal history of other infectious and parasitic diseases: Secondary | ICD-10-CM | POA: Insufficient documentation

## 2020-07-11 DIAGNOSIS — Z7901 Long term (current) use of anticoagulants: Secondary | ICD-10-CM | POA: Insufficient documentation

## 2020-07-11 DIAGNOSIS — E119 Type 2 diabetes mellitus without complications: Secondary | ICD-10-CM | POA: Insufficient documentation

## 2020-07-11 DIAGNOSIS — K219 Gastro-esophageal reflux disease without esophagitis: Secondary | ICD-10-CM | POA: Diagnosis not present

## 2020-07-11 DIAGNOSIS — G4733 Obstructive sleep apnea (adult) (pediatric): Secondary | ICD-10-CM | POA: Diagnosis not present

## 2020-07-11 DIAGNOSIS — Z86718 Personal history of other venous thrombosis and embolism: Secondary | ICD-10-CM | POA: Diagnosis not present

## 2020-07-11 DIAGNOSIS — A4901 Methicillin susceptible Staphylococcus aureus infection, unspecified site: Secondary | ICD-10-CM | POA: Diagnosis not present

## 2020-07-11 DIAGNOSIS — Z95811 Presence of heart assist device: Secondary | ICD-10-CM | POA: Insufficient documentation

## 2020-07-11 DIAGNOSIS — I428 Other cardiomyopathies: Secondary | ICD-10-CM | POA: Insufficient documentation

## 2020-07-11 LAB — COMPREHENSIVE METABOLIC PANEL
ALT: 17 U/L (ref 0–44)
AST: 22 U/L (ref 15–41)
Albumin: 3.8 g/dL (ref 3.5–5.0)
Alkaline Phosphatase: 128 U/L — ABNORMAL HIGH (ref 38–126)
Anion gap: 12 (ref 5–15)
BUN: 14 mg/dL (ref 8–23)
CO2: 17 mmol/L — ABNORMAL LOW (ref 22–32)
Calcium: 9.2 mg/dL (ref 8.9–10.3)
Chloride: 110 mmol/L (ref 98–111)
Creatinine, Ser: 1 mg/dL (ref 0.61–1.24)
GFR calc Af Amer: >60 mL/min (ref >60)
GFR calc non Af Amer: >60 mL/min (ref >60)
Glucose, Bld: 114 mg/dL — ABNORMAL HIGH (ref 70–99)
Potassium: 3.9 mmol/L (ref 3.5–5.1)
Sodium: 139 mmol/L (ref 135–145)
Total Bilirubin: 0.5 mg/dL (ref 0.3–1.2)
Total Protein: 7 g/dL (ref 6.5–8.1)

## 2020-07-11 LAB — CBC
HCT: 38.3 % — ABNORMAL LOW (ref 39.0–52.0)
Hemoglobin: 11.8 g/dL — ABNORMAL LOW (ref 13.0–17.0)
MCH: 27.5 pg (ref 26.0–34.0)
MCHC: 30.8 g/dL (ref 30.0–36.0)
MCV: 89.3 fL (ref 80.0–100.0)
Platelets: 343 10*3/uL (ref 150–400)
RBC: 4.29 MIL/uL (ref 4.22–5.81)
RDW: 17.2 % — ABNORMAL HIGH (ref 11.5–15.5)
WBC: 7.6 10*3/uL (ref 4.0–10.5)
nRBC: 0 % (ref 0.0–0.2)

## 2020-07-11 LAB — PREALBUMIN: Prealbumin: 31.1 mg/dL (ref 18–38)

## 2020-07-11 LAB — PROTIME-INR
INR: 2.4 — ABNORMAL HIGH (ref 0.8–1.2)
Prothrombin Time: 25.7 seconds — ABNORMAL HIGH (ref 11.4–15.2)

## 2020-07-11 LAB — LACTATE DEHYDROGENASE: LDH: 214 U/L — ABNORMAL HIGH (ref 98–192)

## 2020-07-11 MED ORDER — OCTREOTIDE ACETATE 20 MG IM KIT: 20.0000 mg | PACK | INTRAMUSCULAR | Status: DC

## 2020-07-11 MED ORDER — HYDRALAZINE HCL 50 MG PO TABS
50.0000 mg | ORAL_TABLET | Freq: Three times a day (TID) | ORAL | 5 refills | Status: DC
Start: 2020-07-11 — End: 2020-09-12

## 2020-07-11 MED ORDER — BUPROPION HCL ER (SR) 150 MG PO TB12: 150.0000 mg | ORAL_TABLET | Freq: Two times a day (BID) | ORAL | 3 refills | Status: DC

## 2020-07-11 NOTE — Patient Instructions (Signed)
Please continue your Keflex 4 times a day and come back in 4 months  I will ask the VAD ladies to do some extra blood work I need next time you have a draw.

## 2020-07-11 NOTE — Progress Notes (Signed)
LVAD INR 

## 2020-07-11 NOTE — Progress Notes (Addendum)
Patient presents for sick visit in VAD Clinic today with his wife. Reports no problems with VAD equipment or concerns with drive line.  Pt was hospitalized 7/23-8/1 for GIB.  See wound care notes below.  Vital Signs:  Doppler Pressure: 100 Automatc BP: 124/86 (102) HR: 92 SPO2: 100% RA  Weight: 200.2 lb w/o eqt Last weight: 194.2 lb  VAD Indication: Destination Therapy due to smoking status  VAD interrogation & Equipment Management: Speed: 5700 Flow:  4.5 Power:  4.4w    PI: 4.5 Hct: 35  Alarms: few LV Events: 80 + PI events for yesterday Fixed speed: 5700 Low speed limit: 5400  Primary Controller:  Replace back up battery in 20 months. Back up controller:   Replace back up battery in 19 months.  Annual Equipment Maintenance on UBC/PM was performed on 07/2020.   I reviewed the LVAD parameters from today and compared the results to the patient's prior recorded data. LVAD interrogation was NEGATIVE for significant power changes, NEGATIVE for clinical alarms and NEGATIVE for PI events/speed drops. No programming changes were made and pump is functioning within specified parameters. Pt is performing daily controller and system monitor self tests along with completing weekly and monthly maintenance for LVAD equipment.  LVAD equipment check completed and is in good working order. Back-up equipment present.   Abdominal incision care: Abdominal incision with gauze dressing in place packed with plain packing gauze. Dressing removed. Scant amount of serosanguinous drainage from lower portion of incision. Cleansed with betadine. Opened area at top of area tunnels 2.5 cm. Opened area on the bottom of incision tunnels 3 cm.  Cellurate applied to wound. Packed with plain packing strip and covered w/4 x 4 and tape.Wife instructed to change on Friday or as needed when saturated.       Exit Site Care: Drive line is being maintained daily by [name]. Gauze dressing with anchor  intact and accurately applied. Existing dressing removed using sterile technique. Site cleansed with betadine swab x 2 and allowed to dry. Cellerate applied to wound bed. Topped with saline moistened 2 x 2 and dry 4 x 4. Scant amount of thick, clear/yellow drainage noted. No foul odor or tenderness. Exit site with minimal tissue ingrown. Anchor changed. Continue every other day dressing changes using saline moinstened 2 x 2. Covered with gauze dressing. Anchor intact. Patient has adequate dressing supplies at home. Instructed caregiver to change Friday.         Device: N/A   BP & Labs:  Doppler 100 - Doppler is reflecting MAP   Hgb 11.8 - No S/S of bleeding. Denies any s/s of bleeding    LDH stable at 214 with established baseline of 150 - 220. Denies tea-colored urine. No power elevations noted on interrogation.   2 year Intermacs follow up completed including:  Quality of Life, KCCQ-12, and Neurocognitive trail making.   Pt completed 1250 feet during 6 minute walk.  Back up controller:  11V backup battery charged during this visit.   Batteries Manufacture Date: Number of uses: Re-calibration  05/06/18 114 needs  05/06/18 194 needs   Annual maintenance completed per [employer] on patient's home power module and Warehouse manager.    Backup system controller 11 volt battery charged during visit.   [facility] Cardiomyopathy Questionnaire  KCCQ-12 07/11/2020  1 a. Ability to shower/bathe Slightly limited  1 b. Ability to walk 1 block Moderately limited  1 c. Ability to hurry/jog Quite a bit limited  2. Edema feet/ankles/legs  Never over the past 2 weeks  3. Limited by fatigue Less than once a week  4. Limited by dyspnea Less than once a week  5. Sitting up / on 3+ pillows Never over the past 2 weeks  6. Limited enjoyment of life Slightly limited  7. Rest of life w/ symptoms Mostly satisfied  8 a. Participation in hobbies Moderately limited  8 b. Participation in  chores Slightly limited  8 c. Visiting family/friends Slightly limited      Patient Instructions: 1. Increase Hydralazine to 1 tablet three times a day 2. Start Wellbutrin for smoking cessation, take 1 tablet daily for 3 days and then increase to 2 tablets daily 3. Return to clinic for a dressing change and BP check in 1 week 4. Change both dressings on Friday unless visibily soiled. 5. Return to clinic in 2 months for a visit w/Dr. Caroll Rancher RN VAD Coordinator  Office: [phone]  24/7 Pager: [phone]    59 y.o. with history of nonischemic cardiomyopathy, RLE DVT, cirrhosis, smoking, and OSA returns for followup of CHF/LVAD placement.  Cardiomyopathy was diagnosed in 6/19 in [place], [place] at that time showed low output.  He was admitted to [facility] in 9/19 with low output HF and was started on milrinone and diuresed.  Unable to wean off milrinone.  He had a degree of RV failure, but this improved on milrinone.  Valvular heart disease also looked better with milrinone and diuresis.  On 08/03/18, he had Heartmate 3 LVAD placed.  Speed was optimized by ramp echo post-op.  Post-op course was relatively unremarkable.  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  No abscess on CT.  He was started on cefazolin IV for 6 wks.  BP-active meds decreased with low MAP.   He was admitted in 3/21 with subxiphoid abscess and cellulitis.  CT showed collection along the course of the driveline.  There was no evidence for sternal osteomyelitis.  Abscess was debrided, MSSA grew from wound cultures.  Wound vac was placed.  Patient was started on cefazolin, which was recently stopped after completing 8 wks.   Patient was re-admitted later in 3/21 with facial Zoster.  He was treated with acyclovir and this has resolved.   He was admitted in 6/21 with bleeding from the site of the subxiphoid abscess as well as  cellulitis.  He required multiple units of PRBCs.  He went to the OR initially for I&D.  Blood and wound cultures grew MSSA.  He then went back to the OR for rectus flap over wound site (plastics).  He was started on cefazolin and rifabutin, has PICC.   He was admitted in 7/21 with GI bleeding, he had 6 units PRBCs.  EGD showed duodenal AVMs, treated with APC.  INR goal with warfarin lowered to 1.8-2.3.   He returns for LVAD followup.  He has been doing well recently.  Most recent hgb 10.6.  Still smoking a couple of cigarettes/day.  Driveline site is benign, wound site in abdomen with 2 open small ulcers but otherwise healing well. He denies dyspnea walking on flat ground.  Goes walking in the mall.  No lightheadedness. MAP elevated at 100 today.   Denies LVAD alarms.  Reports taking Coumadin as prescribed and adherence to anticoagulation based dietary restrictions.  Denies bright red blood per rectum or melena, no dark urine or hematuria.    Labs (  9/19): LDH 210, INR 2.17, WBCs 17.1 => 15, hgb 9.7, creatinine 0.72 Labs (10/19): K 4.3, creatinine 0.78 => 0.83, hgb 10.2 Labs (11/19): creatinine 0.79, hgb 10 Labs (2/20): K 3.9, creatinine 0.79 Labs (4/20): K 4, creatinine 1.15, hgb 11.4, LDH 199, INR 1.9 Labs (5/20): hgb 10.4, WBCs 8.5 Labs (6/20): K 4.2, creatinine 1.06, hgb 11.2 Labs (8/20): k 3.7, creatinine 1.0, hgb 11.5, LDH 186 Labs (10/20): K 3.5, creatinine 0.93, INR 2, LDH 221 Labs (12/20): hgb 15.6, K 3.7, creatinine 1.02, LDH 250 Labs (4/21): K 4.1, creatinine 0.86, hgb 11.1 Labs (6/21): K 4.1, creatinine 0.76, hgb 10, WBCs 11.2 Labs (8/21): hgb 10.6  PMH: 1. Degenerative disc disease.  2. GERD 3. Chronic systolic CHF:  Nonischemic cardiomyopathy.  Dilated cardiomyopathy diagnosed 6/19 in [place].  LHC/RHC in 7/19 showed elevated filling pressures, low cardiac output, and no significant CAD.   - Echo (9/19): Severe LV dilation with EF 10-20%, moderate-severe MR, severely dilated  RV with mildly decreased systolic function, severe TR.  - Cardiac MRI (9/19): EF 14%, moderate dilated LV, severely dilated RV with mod-severe systolic function and EF 25%, nonspecific RV insertion site LGE.  - RHC (5/19) on milrinone 0.375: mean RA 8, PA 40/19, mean PCWP 12, PAPi 2.65, CI 2.71.  - Echo (9/19) on milrinone and diuresed): EF 20-25% with moderate LV dilation, moderately dilated RV with mildly decreased systolic function, mild-moderate MR, moderate TR, PASP 51 mmHg.  Cannot rule out noncompaction.  - Heartmate 3 LVAD placement in 9/19.  4. OSA: CPAP use.  5. Prior smoker 6. Type 2 diabetes 7. Hyperlipidemia.  8. H/o NSVT 9. 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  10. RLE DVT: found in 9/19.  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 Subxiphoid abscess involving collection along driveline in 3/21, MSSA.  6/21 Rectus flap to cover abscess site.  12. Facial Zoster 3/21 13. 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   Current Outpatient Medications  Medication Sig Dispense Refill  . acetaminophen (TYLENOL) 500 MG tablet Take 1,000 mg by mouth every 6 (six) hours as needed for mild pain.    . carbamide peroxide (DEBROX) 6.5 % OTIC solution Place 5 drops into both ears 3 (three) times daily. 15 mL 0  . cephALEXin (KEFLEX) 500 MG capsule Take 1 capsule (500 mg total) by mouth 4 (four) times daily. Restart medication 06/25/20 120 capsule 5  . cyanocobalamin 500 MCG tablet Take 1,000 mcg by mouth daily.     Marland Kitchen docusate sodium (COLACE) 100 MG capsule Take 2 capsules (200 mg total) by mouth daily as needed for mild constipation. 10 capsule 0  . fluticasone (FLONASE) 50 MCG/ACT nasal spray Place 1 spray into both nostrils 2 (two) times daily as needed for allergies.     Marland Kitchen gabapentin (NEURONTIN) 300 MG capsule Take 2 capsules (600 mg total) by mouth 4 (four) times daily. 240 capsule 6  . glucose blood (CONTOUR NEXT TEST)  test strip Use as instructed 100 each 12  . hydrALAZINE (APRESOLINE) 50 MG tablet Take 1 tablet (50 mg total) by mouth 3 (three) times daily. 90 tablet 5  . hydrOXYzine (ATARAX/VISTARIL) 50 MG tablet Take 1 tablet (50 mg total) by mouth 3 (three) times daily as needed for itching. 40 tablet 3  . Ipratropium-Albuterol (COMBIVENT) 20-100 MCG/ACT AERS respimat Inhale 1 puff into the lungs every 6 (six) hours as needed for wheezing or shortness of breath.     Marland Kitchen  Lancets (FREESTYLE) lancets Use as instructed 100 each 12  . nystatin (MYCOSTATIN/NYSTOP) powder Apply 1 application topically 3 (three) times daily. 15 g 3  . oxyCODONE (OXY IR/ROXICODONE) 5 MG immediate release tablet Take 1-2 tablets (5-10 mg total) by mouth every 4 (four) hours as needed for moderate pain. 30 tablet 0  . pantoprazole (PROTONIX) 40 MG tablet Take 1 tablet (40 mg total) by mouth 2 (two) times daily. Take 40 mg twice daily for 8 weeks (2 months) and then back down to 40 mg daily (Patient taking differently: Take 40 mg by mouth 2 (two) times daily. ) 60 tablet 2  . sacubitril-valsartan (ENTRESTO) 49-51 MG Take 1 tablet by mouth 2 (two) times daily. 60 tablet 0  . sildenafil (VIAGRA) 100 MG tablet Take 0.5 tablets (50 mg total) by mouth daily as needed for erectile dysfunction. 10 tablet 6  . spironolactone (ALDACTONE) 25 MG tablet Take 1 tablet (25 mg total) by mouth daily. 30 tablet 5  . thiamine 100 MG tablet Take 1 tablet (100 mg total) by mouth daily. 30 tablet 0  . traMADol (ULTRAM) 50 MG tablet Take 2 tablets (100 mg total) by mouth every 6 (six) hours as needed for severe pain. 20 tablet 0  . warfarin (COUMADIN) 5 MG tablet Take 7.5 mg (1.5 tab) every Monday and 5 mg all other days or as directed by HF Clinic. 100 tablet 3  . Zinc Gluconate 100 MG TABS Take 1 tablet (100 mg total) by mouth daily. 90 tablet 3  . zolpidem (AMBIEN) 5 MG tablet TAKE 1 TABLET BY MOUTH EVERY DAY AT BEDTIME (Patient taking differently: Take 5 mg by  mouth at bedtime. ) 30 tablet 2  . buPROPion (WELLBUTRIN SR) 150 MG 12 hr tablet Take 1 tablet (150 mg total) by mouth 2 (two) times daily. TAKE ONE TABLET DAILY FOR 3 DAYS AND THEN INCREASE TO ONE TABLET TWICE DAILY 60 tablet 3   Current Facility-Administered Medications  Medication Dose Route Frequency Provider Last Rate Last Admin  . [START ON 07/18/2020] octreotide (SANDOSTATIN LAR) IM injection 20 mg  20 mg Intramuscular Q28 days Laurey Morale, MD        Chlorhexidine and Other  REVIEW OF SYSTEMS: All systems negative except as listed in HPI, PMH and Problem list.   LVAD INTERROGATION:  Please see LVAD nurse's note above for details.    I reviewed the LVAD parameters from today, and compared the results to the patient's prior recorded data.  No programming changes were made.  The LVAD is functioning within specified parameters.  The patient performs LVAD self-test daily.  LVAD interrogation was negative for any significant power changes, alarms or PI events/speed drops.  LVAD equipment check completed and is in good working order.  Back-up equipment present.   LVAD education done on emergency procedures and precautions and reviewed exit site care.   MAP 100  Physical Exam: General: Well appearing this am. NAD.  HEENT: Normal. Neck: Supple, JVP 7-8 cm. Carotids OK.  Cardiac:  Mechanical heart sounds with LVAD hum present.  Lungs:  CTAB, normal effort.  Abdomen:  NT, ND, no HSM. No bruits or masses. +BS  LVAD exit site: Well-healed and incorporated. Dressing dry and intact. No erythema or drainage. Stabilization device present and accurately applied. Driveline dressing changed daily per sterile technique. Extremities:  Warm and dry. No cyanosis, clubbing, rash, or edema.  Neuro:  Alert & oriented x 3. Cranial nerves grossly intact. Moves all 4  extremities w/o difficulty. Affect pleasant   Skin: abdominal wound healing but with 2 small ulcerations.     ASSESSMENT AND PLAN:   1.  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  LVAD parameters stable.  MAP elevated today.  NYHA class I-II.   He does not look volume overloaded.  - He does not need Lasix.  Encouraged hydration.    - Continue spironolactone 25 mg daily.  - Continue Entresto 49/51 bid.  - Increase hydralazine to 50 mg tid.  - He is off ASA with GI bleeding.  - Continue warfarin for INR 1.8-2.3.  CBC today.    - Should be transplant candidate eventually if he can quit smoking.  Will make transplant clinic referral when he is off totally.  2. Smoking: He has cut back a lot but still smoking.  - I will give him a prescription for Wellbutrin.  3. RLE DVT: On warfarin for LVAD.  4. OSA: Continue CPAP.  5. Hyperlipidemia: Atorvastatin.  6. Type II diabetes: Per PCP.  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   - He will have long-term Keflex for MSSA suppression.  Followup with ID soon.  8. 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  - Off ASA, INR goal now 1.8-2.3.  - I will have him start monthly octreotide injections.   Marca Ancona 07/11/2020

## 2020-07-11 NOTE — Patient Instructions (Signed)
1. Increase Hydralazine to 1 tablet three times a day 2. Start Wellbutrin for smoking cessation, take 1 tablet daily for 3 days and then increase to 2 tablets daily 3. Return to clinic for a dressing change and BP check in 1 week 4. Change both dressings on Friday unless visibily soiled. 5. Return to clinic in 2 months for a visit w/Dr. Shirlee Latch

## 2020-07-12 ENCOUNTER — Other Ambulatory Visit (HOSPITAL_COMMUNITY): Payer: Self-pay | Admitting: Cardiology

## 2020-07-12 ENCOUNTER — Other Ambulatory Visit (HOSPITAL_COMMUNITY): Payer: Self-pay | Admitting: *Deleted

## 2020-07-12 DIAGNOSIS — Z95811 Presence of heart assist device: Secondary | ICD-10-CM

## 2020-07-12 DIAGNOSIS — I159 Secondary hypertension, unspecified: Secondary | ICD-10-CM

## 2020-07-12 DIAGNOSIS — I5022 Chronic systolic (congestive) heart failure: Secondary | ICD-10-CM

## 2020-07-12 MED ORDER — SACUBITRIL-VALSARTAN 49-51 MG PO TABS: 1.0000 | ORAL_TABLET | Freq: Two times a day (BID) | ORAL | 11 refills | Status: DC

## 2020-07-13 ENCOUNTER — Other Ambulatory Visit (HOSPITAL_COMMUNITY): Payer: Self-pay | Admitting: *Deleted

## 2020-07-13 DIAGNOSIS — Z7901 Long term (current) use of anticoagulants: Secondary | ICD-10-CM

## 2020-07-13 DIAGNOSIS — Z95811 Presence of heart assist device: Secondary | ICD-10-CM

## 2020-07-13 DIAGNOSIS — K922 Gastrointestinal hemorrhage, unspecified: Secondary | ICD-10-CM

## 2020-07-18 ENCOUNTER — Ambulatory Visit (HOSPITAL_COMMUNITY)
Admission: RE | Admit: 2020-07-18 | Discharge: 2020-07-18 | Disposition: A | Discharge status: Home / Self Care | Payer: No Typology Code available for payment source | Source: Ambulatory Visit | Attending: Internal Medicine | Admitting: Internal Medicine

## 2020-07-18 ENCOUNTER — Encounter (HOSPITAL_COMMUNITY): Payer: Self-pay

## 2020-07-18 ENCOUNTER — Ambulatory Visit (HOSPITAL_COMMUNITY): Payer: Self-pay | Admitting: Pharmacist

## 2020-07-18 ENCOUNTER — Encounter (HOSPITAL_COMMUNITY)
Admission: RE | Admit: 2020-07-18 | Discharge: 2020-07-18 | Disposition: A | Discharge status: Home / Self Care | Payer: No Typology Code available for payment source | Source: Ambulatory Visit | Attending: Cardiology | Admitting: Cardiology

## 2020-07-18 ENCOUNTER — Other Ambulatory Visit: Payer: Self-pay

## 2020-07-18 ENCOUNTER — Other Ambulatory Visit (HOSPITAL_COMMUNITY): Payer: Self-pay | Admitting: *Deleted

## 2020-07-18 VITALS — BP 108/69 | HR 88

## 2020-07-18 DIAGNOSIS — Z7901 Long term (current) use of anticoagulants: Secondary | ICD-10-CM

## 2020-07-18 DIAGNOSIS — T827XXA Infection and inflammatory reaction due to other cardiac and vascular devices, implants and grafts, initial encounter: Secondary | ICD-10-CM

## 2020-07-18 DIAGNOSIS — Z95811 Presence of heart assist device: Secondary | ICD-10-CM

## 2020-07-18 DIAGNOSIS — K922 Gastrointestinal hemorrhage, unspecified: Secondary | ICD-10-CM | POA: Diagnosis present

## 2020-07-18 LAB — PROTIME-INR
INR: 3.1 — ABNORMAL HIGH (ref 0.8–1.2)
Prothrombin Time: 31.2 seconds — ABNORMAL HIGH (ref 11.4–15.2)

## 2020-07-18 MED ORDER — OCTREOTIDE ACETATE 20 MG IM KIT
20.0000 mg | PACK | INTRAMUSCULAR | Status: DC
  Administered 2020-07-18: 20 mg via INTRAMUSCULAR
  Filled 2020-07-18: qty 1

## 2020-07-18 NOTE — Progress Notes (Signed)
LVAD INR 

## 2020-07-18 NOTE — Telephone Encounter (Signed)
Refill for Ambien 5 mg tablets #30 refill 4 called in to [facility] per Dr Shirlee Latch / patient request.   Alyce Pagan RN VAD Coordinator  Office: [phone]  24/7 Pager: [phone]

## 2020-07-18 NOTE — Progress Notes (Signed)
Patient presents for INR check and wound care in [facility] Clinic today with his wife. Reports no problems with VAD equipment or concerns with drive line.  BP improved this week after increasing Hydralazine to 50 mg TID last week. See below.   Currently taking Keflex 500 mg four times daily. See wound care notes below. Wound culture sent today.  Vital Signs:  Doppler Pressure: not done Automatc BP: 108/69 (88) HR: 88 SPO2: 99% RA  Abdominal incision care: Abdominal incision with gauze dressing in place packed with plain packing gauze. Dressing removed. Scant amount of serosanguinous drainage from lower portion of incision. Cleansed with betadine. Opened area at top of area tunnels 2.5 cm. Opened area on the bottom of incision tunnels 3 cm.  Cellurate applied to wound. Packed with plain packing strip and covered w/4 x 4 and tape.Wife instructed to change on Friday or as needed when saturated.    Exit Site Care: Drive line is being maintained daily by [name]. Gauze dressing with anchor intact and accurately applied. Existing dressing removed using sterile technique. Site cleansed with betadine swab x 2 and allowed to dry. Cellerate applied to wound bed. Topped with saline moistened 2 x 2 and dry 4 x 4. Small amount of thick, green/yellow drainage noted. No foul odor or tenderness. Small bumpy rash noted under dressing. Exit site with minimal tissue ingrown. Anchor changed. Continue every other day dressing changes using saline moinstened 2 x 2. Covered with gauze dressing. Anchor intact. Wound culture obtained today. Patient provided with 14 daily kits, 4 x 4s, 2 x 2s, and adhesive remover wipes. Instructed caregiver to change Friday, or as needed if drainage noted.    Patient Instructions: 1. No medication changes today 2. Coumadin dosing per [name] PharmD 3. Return to VAD clinic in 1 week for INR & dressing change   Alyce Pagan RN VAD Coordinator  Office: [phone]  24/7 Pager:  [phone]

## 2020-07-18 NOTE — Addendum Note (Signed)
Addended by: [name] B on: 07/18/2020 10:47 AM   Modules accepted: Orders

## 2020-07-20 ENCOUNTER — Other Ambulatory Visit (HOSPITAL_COMMUNITY): Payer: Self-pay | Admitting: *Deleted

## 2020-07-20 DIAGNOSIS — Z95811 Presence of heart assist device: Secondary | ICD-10-CM

## 2020-07-20 DIAGNOSIS — Z7901 Long term (current) use of anticoagulants: Secondary | ICD-10-CM

## 2020-07-21 LAB — AEROBIC CULTURE W GRAM STAIN (SUPERFICIAL SPECIMEN)
Culture: NO GROWTH
Gram Stain: NONE SEEN

## 2020-07-25 ENCOUNTER — Other Ambulatory Visit: Payer: Self-pay

## 2020-07-25 ENCOUNTER — Ambulatory Visit (HOSPITAL_COMMUNITY)
Admission: RE | Admit: 2020-07-25 | Discharge: 2020-07-25 | Disposition: A | Discharge status: Home / Self Care | Payer: No Typology Code available for payment source | Source: Ambulatory Visit | Attending: Internal Medicine | Admitting: Internal Medicine

## 2020-07-25 ENCOUNTER — Ambulatory Visit (HOSPITAL_COMMUNITY): Payer: Self-pay | Admitting: Pharmacist

## 2020-07-25 DIAGNOSIS — Z7901 Long term (current) use of anticoagulants: Secondary | ICD-10-CM | POA: Insufficient documentation

## 2020-07-25 DIAGNOSIS — Z5181 Encounter for therapeutic drug level monitoring: Secondary | ICD-10-CM | POA: Diagnosis present

## 2020-07-25 LAB — PROTIME-INR
INR: 2.2 — ABNORMAL HIGH (ref 0.8–1.2)
Prothrombin Time: 23.2 seconds — ABNORMAL HIGH (ref 11.4–15.2)

## 2020-07-25 NOTE — Progress Notes (Signed)
Patient presents for INR check and wound care in V[facility] Clinic today with his wife. Reports no problems with VAD equipment or concerns with drive line.  Currently taking Keflex 500 mg four times daily. See wound care notes below. Wound culture from last week negative.   Abdominal incision care: Abdominal incision with gauze dressing in place packed with plain packing gauze. Dressing removed. Scant amount of serosanguinous drainage from lower portion of incision. Cleansed with betadine. Opened area at top of area tunnels 2.5 cm. Opened area on the bottom of incision tunnels 3 cm.  Cellurate applied to wound. Packed with plain packing strip and covered w/4 x 4 and tape.Wife instructed to change on Friday or as needed when saturated.     Exit Site Care: Drive line is being maintained once a week (on Fridays) by [name], and once weekly by VAD coordinators. Gauze dressing with anchor intact and accurately applied. Existing dressing removed using sterile technique. Site cleansed with betadine swab x 2 and allowed to dry. Cellerate applied to wound bed. Topped with saline moistened 2 x 2 and dry 4 x 4. Exit site with partial tissue ingrowth. Scant yellow/brown drainage dried on 2 x 2. No foul odor or tenderness. Rash around exit site resolved. Rash noted under previous anchor site. Anchor replaced; repositioned. Continue twice weekly dressing changes using saline moinstened 2 x 2. Covered with gauze dressing. Instructed caregiver to change Friday, or as needed if drainage noted.    Patient Instructions: 1. No medication changes today 2. Coumadin dosing per [name] PharmD 3. Return to VAD clinic in 1 week for INR & dressing change   Alyce Pagan RN VAD Coordinator  Office: [phone]  24/7 Pager: [phone]

## 2020-07-25 NOTE — Progress Notes (Signed)
LVAD INR 

## 2020-07-26 ENCOUNTER — Other Ambulatory Visit (HOSPITAL_COMMUNITY): Payer: Self-pay | Admitting: *Deleted

## 2020-07-26 DIAGNOSIS — Z95811 Presence of heart assist device: Secondary | ICD-10-CM

## 2020-07-26 DIAGNOSIS — Z7901 Long term (current) use of anticoagulants: Secondary | ICD-10-CM

## 2020-07-31 ENCOUNTER — Other Ambulatory Visit: Payer: Self-pay

## 2020-07-31 ENCOUNTER — Encounter (HOSPITAL_COMMUNITY): Payer: Medicare HMO

## 2020-07-31 ENCOUNTER — Ambulatory Visit (HOSPITAL_COMMUNITY): Payer: Self-pay | Admitting: Pharmacist

## 2020-07-31 ENCOUNTER — Ambulatory Visit (HOSPITAL_COMMUNITY)
Admission: RE | Admit: 2020-07-31 | Discharge: 2020-07-31 | Disposition: A | Discharge status: Home / Self Care | Payer: No Typology Code available for payment source | Source: Ambulatory Visit | Attending: Internal Medicine | Admitting: Internal Medicine

## 2020-07-31 DIAGNOSIS — Z95811 Presence of heart assist device: Secondary | ICD-10-CM

## 2020-07-31 DIAGNOSIS — Z7901 Long term (current) use of anticoagulants: Secondary | ICD-10-CM | POA: Insufficient documentation

## 2020-07-31 LAB — PROTIME-INR
INR: 2 — ABNORMAL HIGH (ref 0.8–1.2)
Prothrombin Time: 22.2 seconds — ABNORMAL HIGH (ref 11.4–15.2)

## 2020-07-31 NOTE — Progress Notes (Signed)
LVAD INR 

## 2020-07-31 NOTE — Progress Notes (Signed)
Patient presents for INR check and wound care in [facility] today with his wife. Reports no problems with VAD equipment or concerns with drive line.  Currently taking Keflex 500 mg four times daily. See wound care notes below. Wound culture from last week negative.   Abdominal incision care: Abdominal incision with gauze dressing in place packed with plain packing gauze. Dressing removed. Scant amount of serosanguinous drainage from lower portion of incision. Cleansed with betadine. Opened area at top of area tunnels 2.5 cm. Opened area on the bottom of incision tunnels 3 cm.  Cellurate applied to wound. Packed with plain packing strip and covered w/4 x 4 and tape.Wife instructed to change on Friday or as needed when saturated.       Exit Site Care: Drive line is being maintained once a week (on Fridays) by [name], and once weekly by VAD coordinators. Gauze dressing with anchor intact and accurately applied. Existing dressing removed using sterile technique. Site cleansed with betadine swab x 2 and allowed to dry. Cellerate applied to wound bed. Covered w/dry 4 x 4. Exit site with partial tissue ingrowth. Scant yellow/brown drainage dried on 2 x 2. No foul odor or tenderness.  Anchor replaced; repositioned. Continue twice weekly dressing changes using the daily kit without silver strip.      Patient Instructions: 1. No medication changes today 2. Coumadin dosing per Ander Purpura PharmD 3. Return to [place] clinic in 2 weeks for INR & dressing change  4. Will refer pt for home INR machine today  Tanda Rockers RN [place] Coordinator  Office: [phone]  24/7 Pager: [phone]

## 2020-08-09 ENCOUNTER — Other Ambulatory Visit (HOSPITAL_COMMUNITY): Payer: Self-pay | Admitting: *Deleted

## 2020-08-09 DIAGNOSIS — Z7901 Long term (current) use of anticoagulants: Secondary | ICD-10-CM

## 2020-08-09 DIAGNOSIS — Z95811 Presence of heart assist device: Secondary | ICD-10-CM

## 2020-08-12 ENCOUNTER — Other Ambulatory Visit (HOSPITAL_COMMUNITY): Payer: Self-pay | Admitting: *Deleted

## 2020-08-13 ENCOUNTER — Other Ambulatory Visit (HOSPITAL_COMMUNITY): Payer: Self-pay | Admitting: *Deleted

## 2020-08-13 DIAGNOSIS — Z95811 Presence of heart assist device: Secondary | ICD-10-CM

## 2020-08-13 DIAGNOSIS — T827XXA Infection and inflammatory reaction due to other cardiac and vascular devices, implants and grafts, initial encounter: Secondary | ICD-10-CM

## 2020-08-13 NOTE — Assessment & Plan Note (Signed)
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  He has completed another round of IV cefazolin.  Baseline chronic drainage from driveline exit site without any surrounding cellulitis or pain.  No tenderness tracking up towards driveline into the subxiphoid space.  Overall this has healed with the exception of 2 small openings.  We treated him with a short course of therapy covering corynebacterium stratum.  Not certain this has improved things much, as they just look to be chronically nonhealing not acutely infected. We will transition back to 4 times daily cephalexin.  We will try to bump back down to 3 times daily if wounds are stable and no changes at next office visit in 3 months. ESR and CRP to be drawn next time he is labs in the med clinic. 

## 2020-08-13 NOTE — Progress Notes (Signed)
Patient: Theodore Welch  DOB: 10/24/1961 MRN: [id number] PCP: Reubin Milan, MD    Patient Active Problem List   Diagnosis Date Noted  . Infection associated with driveline of left ventricular assist device (LVAD) ([place]) 11/28/2018    Priority: High  . Complication involving left ventricular assist device (LVAD) 11/23/2018    Priority: High  . LVAD (left ventricular assist device) present ([place]) 08/03/2018    Priority: High  . AVM (arteriovenous malformation) of small bowel, acquired with hemorrhage   . Melena   . Acute blood loss anemia   . Coagulopathy ([place])   . Chronic anticoagulation   . Congestive dilated cardiomyopathy ([place])   . GIB (gastrointestinal bleeding) 06/09/2020  . Abdominal wound dehiscence 05/15/2020  . Acute on chronic combined systolic and diastolic CHF (congestive heart failure) ([place])   . Abscess of sternal region 01/27/2020  . Staph aureus infection 01/27/2020  . Vaccine counseling 08/12/2019  . Chronic systolic (congestive) heart failure ([place]) 11/28/2018  . DVT (deep venous thrombosis) ([place]) 08/02/2018  . Advance care planning   . Ascites   . COPD (chronic obstructive pulmonary disease) ([place]) 07/21/2018  . HTN (hypertension) 07/21/2018  . Tobacco use 07/21/2018  . Congestive heart failure ([place]) 07/21/2018  . CHF (congestive heart failure) ([place]) 07/21/2018  . Type 2 diabetes mellitus ([place]) 07/21/2018  . Hyperlipidemia 07/21/2018  . Fibromyalgia syndrome 01/27/2017  . Myofascial pain syndrome, cervical 01/27/2017  . Lumbar radiculopathy 01/24/2014  . History of lumbar laminectomy for spinal cord decompression 12/07/2013  . 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      Subjective:  CC: Hospital follow up MSSA abscess involving proximal driveline s/p repeat debridement.    ID Hx: Theodore Welch is a 59 y.o. 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  H/O MSSA bacteremia in the setting of  driveline exit site infection s/p debridement of site.    January-2020: MSSA infection of counter incision to mid abdomen; s/p serial debridements. Initially did not involve exit site at this time. Received IV Cefazolin x 4 weeks --> PO Cephalexin x 4 weeks.   [id number]: recurrent MSSA infection, now involving driveline exit site and secondary bacteremia. TEE deferred due to low sensitivity for detecting VAD endocarditis. 6 weeks IV Cefazolin s/p abd wall debridement --> cephalexin for chronic suppression for duration pump is in place.    March-2021 sudden onset subxiphoid abscess involving proximal driveline, MSSA on cultures. Blood cultures negative. S/P I&D of abdominal wall exit site and subxiphoid space. IV cefazolin x 8 weeks (May 10th 2021).  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    Apr 16, 2020 - readmitted following re-opening and bleeding of subxiphoid wound. +MSSA and taken for debridement 6/01. Required repair of outflow graft at this surgery as well (?sharp puncture vs degenerated graft d/t infection). Vertical Rectus Flap repair performed with serial debridements. BC (-). PICC Cefazolin + Rifabutin planned through 7/7   April 27, 2020 - bleeding from sternum r/t subxiphoid abscess --> debridement in OR, Cx again grew out MSSA. Back on IV cefazolin for treatment.     HPI: Discharged recently from significant GIB with hemoglobin ~5. Now starting Octreotide injections.  He wants his PICC line out. Feeling well overall without fevers or chills.  Regaining energy but still little lagging with low hemoglobins.  He has had 2 small areas along the mid abdominal incision that have opened without  much drainage.  No signs of cellulitis.  Cultures twice now have grown out a corynebacterium.  Was on IV vancomycin in the hospital then 6 days of linezolid to treat.  He is now back on 3 times daily cephalexin. Driveline  still being changed once a day with small amount of thick clear serous drainage noted.   Review of Systems  Constitutional: Negative for chills and fever.  HENT: Negative for tinnitus.   Eyes: Negative for blurred vision and photophobia.  Respiratory: Negative for cough and sputum production.   Cardiovascular: Negative for chest pain.  Gastrointestinal: Negative for diarrhea, nausea and vomiting.  Genitourinary: Negative for dysuria.  Skin: Negative for rash.  Neurological: Negative for headaches.   Past Medical History:  Diagnosis Date  . 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 Cardiomyopathy, unspecified ([place])   . CHF (congestive heart failure) ([place])   . Chronic back pain   . Diabetes mellitus without complication ([place])   . Enlarged heart   . Gastric ulcer   . Gastroenteritis   . H/O degenerative disc disease   . Hypertension   . LVAD (left ventricular assist device) present [facility])     Outpatient Medications Prior to Visit  Medication Sig Dispense Refill  . acetaminophen (TYLENOL) 500 MG tablet Take 1,000 mg by mouth every 6 (six) hours as needed for mild pain.    Marland Kitchen buPROPion (WELLBUTRIN SR) 150 MG 12 hr tablet Take 1 tablet (150 mg total) by mouth 2 (two) times daily. TAKE ONE TABLET DAILY FOR 3 DAYS AND THEN INCREASE TO ONE TABLET TWICE DAILY 60 tablet 3  . carbamide peroxide (DEBROX) 6.5 % OTIC solution Place 5 drops into both ears 3 (three) times daily. 15 mL 0  . cephALEXin (KEFLEX) 500 MG capsule Take 1 capsule (500 mg total) by mouth 4 (four) times daily. Restart medication 06/25/20 120 capsule 5  . cyanocobalamin 500 MCG tablet Take 1,000 mcg by mouth daily.     Marland Kitchen docusate sodium (COLACE) 100 MG capsule Take 2 capsules (200 mg total) by mouth daily as needed for mild constipation. 10 capsule 0  . fluticasone (FLONASE) 50 MCG/ACT nasal spray Place 1 spray into both nostrils 2 (two) times daily as needed for allergies.     Marland Kitchen gabapentin (NEURONTIN) 300 MG capsule Take 2  capsules (600 mg total) by mouth 4 (four) times daily. 240 capsule 6  . glucose blood (CONTOUR NEXT TEST) test strip Use as instructed 100 each 12  . hydrALAZINE (APRESOLINE) 50 MG tablet Take 1 tablet (50 mg total) by mouth 3 (three) times daily. 90 tablet 5  . hydrOXYzine (ATARAX/VISTARIL) 50 MG tablet Take 1 tablet (50 mg total) by mouth 3 (three) times daily as needed for itching. 40 tablet 3  . Ipratropium-Albuterol (COMBIVENT) 20-100 MCG/ACT AERS respimat Inhale 1 puff into the lungs every 6 (six) hours as needed for wheezing or shortness of breath.     . Lancets (FREESTYLE) lancets Use as instructed 100 each 12  . nystatin (MYCOSTATIN/NYSTOP) powder Apply 1 application topically 3 (three) times daily. 15 g 3  . oxyCODONE (OXY IR/ROXICODONE) 5 MG immediate release tablet Take 1-2 tablets (5-10 mg total) by mouth every 4 (four) hours as needed for moderate pain. 30 tablet 0  . pantoprazole (PROTONIX) 40 MG tablet Take 1 tablet (40 mg total) by mouth 2 (two) times daily. Take 40 mg twice daily for 8 weeks (2 months) and then back down to 40 mg daily (Patient taking  differently: Take 40 mg by mouth 2 (two) times daily. ) 60 tablet 2  . sildenafil (VIAGRA) 100 MG tablet Take 0.5 tablets (50 mg total) by mouth daily as needed for erectile dysfunction. 10 tablet 6  . spironolactone (ALDACTONE) 25 MG tablet Take 1 tablet (25 mg total) by mouth daily. 30 tablet 5  . thiamine 100 MG tablet Take 1 tablet (100 mg total) by mouth daily. 30 tablet 0  . traMADol (ULTRAM) 50 MG tablet Take 2 tablets (100 mg total) by mouth every 6 (six) hours as needed for severe pain. 20 tablet 0  . warfarin (COUMADIN) 5 MG tablet Take 7.5 mg (1.5 tab) every Monday and 5 mg all other days or as directed by HF Clinic. 100 tablet 3  . Zinc Gluconate 100 MG TABS Take 1 tablet (100 mg total) by mouth daily. 90 tablet 3  . zolpidem (AMBIEN) 5 MG tablet TAKE 1 TABLET BY MOUTH EVERY DAY AT BEDTIME (Patient taking differently: Take 5  mg by mouth at bedtime. ) 30 tablet 2  . sacubitril-valsartan (ENTRESTO) 49-51 MG Take 1 tablet by mouth 2 (two) times daily. 60 tablet 0   No facility-administered medications prior to visit.     Allergies  Allergen Reactions  . Chlorhexidine Rash  . Other Rash    Prep pads    Social History   Tobacco Use  . Smoking status: Current Every Day Smoker    Packs/day: 0.25    Types: Cigarettes    Start date: 2003  . Smokeless tobacco: Never Used  . Tobacco comment: Nicoderm patch   Vaping Use  . Vaping Use: Never used  Substance Use Topics  . Alcohol use: Not Currently  . Drug use: Not Currently     Objective:   Vitals:   07/11/20 1033  BP: 113/81  Pulse: 91  Temp: 98.3 F (36.8 C)  TempSrc: Oral  Weight: 193 lb (87.5 kg)   Body mass index is 27.69 kg/m.  Physical Exam HENT:     Mouth/Throat:     Mouth: No oral lesions.     Dentition: Normal dentition. No dental caries.  Eyes:     General: No scleral icterus. Cardiovascular:     Rate and Rhythm: Normal rate and regular rhythm.     Heart sounds: Normal heart sounds.  Pulmonary:     Effort: Pulmonary effort is normal.     Breath sounds: Normal breath sounds.  Abdominal:     General: There is no distension.     Palpations: Abdomen is soft.     Tenderness: There is no abdominal tenderness.       Comments: Small shadowing to dressing at lower abdomen  LLQ driveline exit site bandage is clean and dry.  Lower sternal incision is healed up nicely with only a small scab.   Lymphadenopathy:     Cervical: No cervical adenopathy.  Skin:    General: Skin is warm and dry.     Findings: No rash.  Neurological:     Mental Status: He is alert and oriented to person, place, and time.     Lab Results: Lab Results  Component Value Date   WBC 7.6 07/11/2020   HGB 11.8 (L) 07/11/2020   HCT 38.3 (L) 07/11/2020   MCV 89.3 07/11/2020   PLT 343 07/11/2020    Lab Results  Component Value Date   CREATININE 1.00  07/11/2020   BUN 14 07/11/2020   NA 139 07/11/2020   K 3.9  07/11/2020   CL 110 07/11/2020   CO2 17 (L) 07/11/2020    Sed Rate  Date Value  05/23/2020 6 mm/h  02/21/2020 20 mm/hr (H)  05/05/2019 4 mm/hr   CRP  Date Value  05/23/2020 12.6 mg/L (H)  02/21/2020 1.2 mg/dL (H)  05/05/2019 <0.8 mg/dL     Assessment & Plan:   Problem List Items Addressed This Visit      High   Infection associated with driveline of left ventricular assist device (LVAD) ([place])    Chronic MSSA infections requiring debridements in the operating room, last on April 27, 2020.  He has completed another round of IV cefazolin.  Baseline chronic drainage from driveline exit site without any surrounding cellulitis or pain.  No tenderness tracking up towards driveline into the subxiphoid space.  Overall this has healed with the exception of 2 small openings.  We treated him with a short course of therapy covering corynebacterium stratum.  Not certain this has improved things much, as they just look to be chronically nonhealing not acutely infected. We will transition back to 4 times daily cephalexin.  We will try to bump back down to 3 times daily if wounds are stable and no changes at next office visit in 3 months. ESR and CRP to be drawn next time he is labs in the med clinic.         Janene Madeira, MSN, NP-C [facility] [email or web address] Pager: [phone] Office: [phone] [place]: [phone]

## 2020-08-14 ENCOUNTER — Other Ambulatory Visit: Payer: Self-pay | Admitting: Infectious Diseases

## 2020-08-15 ENCOUNTER — Ambulatory Visit (HOSPITAL_COMMUNITY)
Admission: RE | Admit: 2020-08-15 | Discharge: 2020-08-15 | Disposition: A | Discharge status: Home / Self Care | Payer: No Typology Code available for payment source | Source: Ambulatory Visit | Attending: Cardiology | Admitting: Cardiology

## 2020-08-15 ENCOUNTER — Ambulatory Visit (HOSPITAL_COMMUNITY): Payer: Self-pay | Admitting: Pharmacist

## 2020-08-15 ENCOUNTER — Encounter (HOSPITAL_COMMUNITY)
Admission: RE | Admit: 2020-08-15 | Discharge: 2020-08-15 | Disposition: A | Discharge status: Home / Self Care | Payer: No Typology Code available for payment source | Source: Ambulatory Visit | Attending: Cardiology | Admitting: Cardiology

## 2020-08-15 ENCOUNTER — Other Ambulatory Visit: Payer: Self-pay

## 2020-08-15 DIAGNOSIS — K922 Gastrointestinal hemorrhage, unspecified: Secondary | ICD-10-CM | POA: Insufficient documentation

## 2020-08-15 DIAGNOSIS — T827XXA Infection and inflammatory reaction due to other cardiac and vascular devices, implants and grafts, initial encounter: Secondary | ICD-10-CM | POA: Insufficient documentation

## 2020-08-15 DIAGNOSIS — Z7901 Long term (current) use of anticoagulants: Secondary | ICD-10-CM | POA: Insufficient documentation

## 2020-08-15 DIAGNOSIS — Z95811 Presence of heart assist device: Secondary | ICD-10-CM

## 2020-08-15 DIAGNOSIS — X58XXXA Exposure to other specified factors, initial encounter: Secondary | ICD-10-CM | POA: Insufficient documentation

## 2020-08-15 LAB — PROTIME-INR
INR: 2.3 — ABNORMAL HIGH (ref 0.8–1.2)
Prothrombin Time: 24.9 seconds — ABNORMAL HIGH (ref 11.4–15.2)

## 2020-08-15 LAB — C-REACTIVE PROTEIN: CRP: 1.3 mg/dL — ABNORMAL HIGH (ref <1.0)

## 2020-08-15 LAB — SEDIMENTATION RATE: Sed Rate: 4 mm/hr (ref 0–16)

## 2020-08-15 MED ORDER — OCTREOTIDE ACETATE 20 MG IM KIT
20.0000 mg | PACK | INTRAMUSCULAR | Status: DC
  Administered 2020-08-15: 20 mg via INTRAMUSCULAR
  Filled 2020-08-15: qty 1

## 2020-08-15 NOTE — Progress Notes (Signed)
Patient presents for labs and and wound care in [facility] today with his wife. Reports no problems with VAD equipment or concerns with drive line.  Currently taking Keflex 500 mg four times daily. See wound care notes below.   Pt will begin home INRs when home monitor arrives and training complete. (currently being shipped to their address).  Abdominal incision care: Abdominal incision with gauze dressing in place packed with plain packing gauze. Dressing removed. Scant amount of serosanguinous drainage from lower portion of incision. Cleansed with betadine. Opened area at top of area tunnels 1 cm. Opened area on the bottom of incision tunnels 2.5 cm. Packed with plain packing strip and covered w/ 2 x 2 gauze and tape.Wife reports she is changing on Tues/Friday each week. Provided with additional 2 x 2 gauze.   Exit Site Care: Drive line is being maintained twice weekly on Tues/Fri by [employer]. Gauze dressing with anchor intact and accurately applied. Existing dressing removed using sterile technique. Site cleansed with betadine swab x 2 and allowed to dry. Covered w/dry 4 x 4s. Exit site with partial tissue ingrowth. Scant yellow/brown drainage, no foul odor or tenderness.  Anchor replaced; repositioned. Continue twice weekly dressing changes using the daily kit without silver strip.    Patient Instructions: 1. Lauren, PharmD will contact you with INR results and warfarin dosing. 2. Return to [facility] on scheduled appointment.  3. Call VAD Clinic f any changes in wound appearances.   Zada Girt, RN VAD Coordinator  Office: [phone]  24/7 Pager: [phone]

## 2020-08-15 NOTE — Progress Notes (Signed)
LVAD INR  sch

## 2020-08-21 ENCOUNTER — Ambulatory Visit (HOSPITAL_COMMUNITY): Payer: Self-pay | Admitting: Pharmacist

## 2020-08-21 DIAGNOSIS — Z95811 Presence of heart assist device: Secondary | ICD-10-CM

## 2020-08-21 LAB — POCT INR: INR: 2 (ref 2.0–3.0)

## 2020-08-21 NOTE — Progress Notes (Signed)
LVAD INR 

## 2020-08-28 ENCOUNTER — Ambulatory Visit (HOSPITAL_COMMUNITY): Payer: Self-pay | Admitting: Pharmacist

## 2020-08-28 LAB — POCT INR: INR: 3.2 — AB (ref 2.0–3.0)

## 2020-08-28 NOTE — Progress Notes (Signed)
LVAD INR 

## 2020-08-31 ENCOUNTER — Other Ambulatory Visit (HOSPITAL_COMMUNITY): Payer: No Typology Code available for payment source

## 2020-09-04 ENCOUNTER — Ambulatory Visit (HOSPITAL_COMMUNITY): Payer: Self-pay | Admitting: Pharmacist

## 2020-09-04 LAB — POCT INR: INR: 2.9 (ref 2.0–3.0)

## 2020-09-04 MED ORDER — WARFARIN SODIUM 5 MG PO TABS
ORAL_TABLET | ORAL | 3 refills | Status: DC
Start: 2020-09-04 — End: 2020-10-09

## 2020-09-04 NOTE — Progress Notes (Signed)
LVAD INR 

## 2020-09-08 ENCOUNTER — Other Ambulatory Visit (HOSPITAL_COMMUNITY): Payer: Self-pay | Admitting: *Deleted

## 2020-09-08 DIAGNOSIS — Z7901 Long term (current) use of anticoagulants: Secondary | ICD-10-CM

## 2020-09-08 DIAGNOSIS — I5022 Chronic systolic (congestive) heart failure: Secondary | ICD-10-CM

## 2020-09-08 DIAGNOSIS — T827XXA Infection and inflammatory reaction due to other cardiac and vascular devices, implants and grafts, initial encounter: Secondary | ICD-10-CM

## 2020-09-08 DIAGNOSIS — Z95811 Presence of heart assist device: Secondary | ICD-10-CM

## 2020-09-11 ENCOUNTER — Ambulatory Visit (HOSPITAL_COMMUNITY): Payer: Self-pay | Admitting: Pharmacist

## 2020-09-11 LAB — POCT INR: INR: 2.4 (ref 2.0–3.0)

## 2020-09-11 NOTE — Progress Notes (Signed)
LVAD INR 

## 2020-09-12 ENCOUNTER — Other Ambulatory Visit: Payer: Self-pay

## 2020-09-12 ENCOUNTER — Encounter (HOSPITAL_COMMUNITY)
Admission: RE | Admit: 2020-09-12 | Discharge: 2020-09-12 | Disposition: A | Discharge status: Home / Self Care | Payer: No Typology Code available for payment source | Source: Ambulatory Visit | Attending: Cardiology | Admitting: Cardiology

## 2020-09-12 ENCOUNTER — Ambulatory Visit (HOSPITAL_BASED_OUTPATIENT_CLINIC_OR_DEPARTMENT_OTHER)
Admission: RE | Admit: 2020-09-12 | Discharge: 2020-09-12 | Disposition: A | Discharge status: Home / Self Care | Payer: No Typology Code available for payment source | Source: Ambulatory Visit | Attending: Cardiology | Admitting: Cardiology

## 2020-09-12 DIAGNOSIS — T827XXA Infection and inflammatory reaction due to other cardiac and vascular devices, implants and grafts, initial encounter: Secondary | ICD-10-CM | POA: Insufficient documentation

## 2020-09-12 DIAGNOSIS — E119 Type 2 diabetes mellitus without complications: Secondary | ICD-10-CM | POA: Insufficient documentation

## 2020-09-12 DIAGNOSIS — G4733 Obstructive sleep apnea (adult) (pediatric): Secondary | ICD-10-CM | POA: Insufficient documentation

## 2020-09-12 DIAGNOSIS — F17200 Nicotine dependence, unspecified, uncomplicated: Secondary | ICD-10-CM | POA: Insufficient documentation

## 2020-09-12 DIAGNOSIS — Z95811 Presence of heart assist device: Secondary | ICD-10-CM | POA: Insufficient documentation

## 2020-09-12 DIAGNOSIS — Z79899 Other long term (current) drug therapy: Secondary | ICD-10-CM | POA: Insufficient documentation

## 2020-09-12 DIAGNOSIS — Z7901 Long term (current) use of anticoagulants: Secondary | ICD-10-CM | POA: Insufficient documentation

## 2020-09-12 DIAGNOSIS — Z86718 Personal history of other venous thrombosis and embolism: Secondary | ICD-10-CM | POA: Insufficient documentation

## 2020-09-12 DIAGNOSIS — E785 Hyperlipidemia, unspecified: Secondary | ICD-10-CM | POA: Insufficient documentation

## 2020-09-12 DIAGNOSIS — I420 Dilated cardiomyopathy: Secondary | ICD-10-CM | POA: Insufficient documentation

## 2020-09-12 DIAGNOSIS — I5022 Chronic systolic (congestive) heart failure: Secondary | ICD-10-CM | POA: Insufficient documentation

## 2020-09-12 LAB — CBC
HCT: 41.2 % (ref 39.0–52.0)
Hemoglobin: 13.2 g/dL (ref 13.0–17.0)
MCH: 27.2 pg (ref 26.0–34.0)
MCHC: 32 g/dL (ref 30.0–36.0)
MCV: 84.9 fL (ref 80.0–100.0)
Platelets: 254 10*3/uL (ref 150–400)
RBC: 4.85 MIL/uL (ref 4.22–5.81)
RDW: 17.2 % — ABNORMAL HIGH (ref 11.5–15.5)
WBC: 8.6 10*3/uL (ref 4.0–10.5)
nRBC: 0 % (ref 0.0–0.2)

## 2020-09-12 LAB — BASIC METABOLIC PANEL
Anion gap: 13 (ref 5–15)
BUN: 12 mg/dL (ref 8–23)
CO2: 21 mmol/L — ABNORMAL LOW (ref 22–32)
Calcium: 9.3 mg/dL (ref 8.9–10.3)
Chloride: 104 mmol/L (ref 98–111)
Creatinine, Ser: 1.17 mg/dL (ref 0.61–1.24)
GFR, Estimated: >60 mL/min (ref >60)
Glucose, Bld: 224 mg/dL — ABNORMAL HIGH (ref 70–99)
Potassium: 4.5 mmol/L (ref 3.5–5.1)
Sodium: 138 mmol/L (ref 135–145)

## 2020-09-12 LAB — SEDIMENTATION RATE: Sed Rate: 2 mm/hr (ref 0–16)

## 2020-09-12 LAB — LACTATE DEHYDROGENASE: LDH: 177 U/L (ref 98–192)

## 2020-09-12 LAB — PROTIME-INR
INR: 2.3 — ABNORMAL HIGH (ref 0.8–1.2)
Prothrombin Time: 24.4 seconds — ABNORMAL HIGH (ref 11.4–15.2)

## 2020-09-12 MED ORDER — OCTREOTIDE ACETATE 20 MG IM KIT
20.0000 mg | PACK | INTRAMUSCULAR | Status: DC
  Administered 2020-09-12: 20 mg via INTRAMUSCULAR
  Filled 2020-09-12: qty 1

## 2020-09-12 MED ORDER — SPIRONOLACTONE 25 MG PO TABS
25.0000 mg | ORAL_TABLET | Freq: Every day | ORAL | 5 refills | Status: DC
Start: 2020-09-12 — End: 2021-04-09

## 2020-09-12 MED ORDER — HYDRALAZINE HCL 50 MG PO TABS
75.0000 mg | ORAL_TABLET | Freq: Three times a day (TID) | ORAL | 5 refills | Status: DC
Start: 2020-09-12 — End: 2021-01-16

## 2020-09-12 NOTE — Addendum Note (Signed)
Encounter addended by: Lebron Quam, RN on: 09/12/2020 11:54 AM  Actions taken: Pharmacy for encounter modified, Order list changed, Order Reconciliation Section accessed, Vitals modified, Clinical Note Signed

## 2020-09-12 NOTE — Progress Notes (Addendum)
Patient presents for 2 mo f/u in [facility] today with his wife. Reports no problems with VAD equipment or concerns with drive line.  Pt states that he has been feeling great lately. Denies any SOB, or blood in his stool. pts wounds are healing well. See wound note below.  pts weight is up 13 lbs since his last visit. Pt states that he is eating well and does not feel like he has any fluid on board today.  Pt states that his controller has been beeping on and off throughout the day while he is on batteries. Pt has had the same controller since implant September 2019. Controller was change today to [id number].  Vital Signs:  Doppler Pressure: 114 Automatc BP: 106/93 (100) HR: 87 SPO2: 98% RA  Weight: 213.8 lb w/o eqt Last weight: 200.2 lb  VAD Indication: Destination Therapy due to smoking status  VAD interrogation & Equipment Management: Speed: 5700 Flow:  4.6 Power:  4.5w    PI: 3.6 Hct: 41  Alarms: few LV Events: 40-50 Fixed speed: 5700 Low speed limit: 5400  Primary Controller:  Replace back up battery in 34 months. Back up controller:   Replace back up battery in 17 months.  Annual Equipment Maintenance on UBC/PM was performed on 07/2020.   I reviewed the LVAD parameters from today and compared the results to the patient's prior recorded data. LVAD interrogation was NEGATIVE for significant power changes, NEGATIVE for clinical alarms and NEGATIVE for PI events/speed drops. No programming changes were made and pump is functioning within specified parameters. Pt is performing daily controller and system monitor self tests along with completing weekly and monthly maintenance for LVAD equipment.  LVAD equipment check completed and is in good working order. Back-up equipment present.   Abdominal incision care: Abdominal incision with gauze dressing in place packed with plain packing gauze. Dressing removed. No drainage from open areas. Cleansed with betadine. Opened area  at top of area tunnels 1 cm. Opened area on the bottom of incision tunnels 1.5 cm.  Cellurate applied to wound. Packed with plain packing strip and covered w/MepilexWife instructed to change weekly or prn when visibly soiled.     Exit Site Care: Drive line is being maintained daily by [name]. Gauze dressing with anchor intact and accurately applied. Existing dressing removed using sterile technique. Site cleansed with betadine swab x 2 and allowed to dry. Cellerate applied to wound bed. Topped with dry 4 x 4. Scant amount of thick, clear/yellow drainage noted. No foul odor or tenderness. Exit site with minimal tissue ingrown. Anchor changed. Continue every other day dressing changes using saline moinstened 2 x 2. Covered with gauze dressing. Anchor intact. Patient given 12 daily dressing kits.        Device: N/A   BP & Labs:  Doppler 114 - Doppler is reflecting Modified systolic   Hgb 13.2 - No S/S of bleeding. Denies any s/s of bleeding    LDH stable at 177 with established baseline of 150 - 220. Denies tea-colored urine. No power elevations noted on interrogation.   Patient Instructions: 1. Increase Hydralazine to 75 mg three times a day (1 and half tablets) 2. Change Mepilex dressing weekly or as needed if soiled. Continue twice weekly driveline dressings. 3. Return to clinic in 2 months  [name] RN VAD Coordinator  Office: [phone]  24/7 Pager: [phone]   59 y.o. with history of nonischemic cardiomyopathy, RLE DVT, cirrhosis, smoking, and OSA returns for followup of CHF/LVAD placement.  Cardiomyopathy was diagnosed in 6/19 in [place], [place] at that time showed low output.  He was admitted to [facility] in 9/19 with low output HF and was started on milrinone and diuresed.  Unable to wean off milrinone.  He had a degree of RV failure, but this improved on milrinone.  Valvular heart disease also looked better with milrinone and diuresis.  On 08/03/18, he had Heartmate 3  LVAD placed.  Speed was optimized by ramp echo post-op.  Post-op course was relatively unremarkable.  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  No abscess on CT.  He was started on cefazolin IV for 6 wks.  BP-active meds decreased with low MAP.   He was admitted in 3/21 with subxiphoid abscess and cellulitis.  CT showed collection along the course of the driveline.  There was no evidence for sternal osteomyelitis.  Abscess was debrided, MSSA grew from wound cultures.  Wound vac was placed.  Patient was started on cefazolin, which was recently stopped after completing 8 wks.   Patient was re-admitted later in 3/21 with facial Zoster.  He was treated with acyclovir and this has resolved.   He was admitted in 6/21 with bleeding from the site of the subxiphoid abscess as well as cellulitis.  He required multiple units of PRBCs.  He went to the OR initially for I&D.  Blood and wound cultures grew MSSA.  He then went back to the OR for rectus flap over wound site (plastics).  He was started on cefazolin and rifabutin, has PICC.   He was admitted in 7/21 with GI bleeding, he had 6 units PRBCs.  EGD showed duodenal AVMs, treated with APC.  INR goal with warfarin lowered to 1.8-2.3.   He returns for LVAD followup.   Still smoking a couple of cigarettes/day.  Driveline site is benign, wound site in abdomen is now closed. No dyspnea walking on flat ground or up stairs, no orthopnea/PND.  No lightheadedness.  Weight is up, ascribes to eating more recently.  MAP elevated.   Denies LVAD alarms.  Reports taking Coumadin as prescribed and adherence to anticoagulation based dietary restrictions.  Denies bright red blood per rectum or melena, no dark urine or hematuria.    Labs (9/19): LDH 210, INR 2.17, WBCs 17.1 => 15, hgb 9.7, creatinine 0.72 Labs (10/19): K 4.3, creatinine 0.78 => 0.83, hgb 10.2 Labs (11/19): creatinine 0.79,  hgb 10 Labs (2/20): K 3.9, creatinine 0.79 Labs (4/20): K 4, creatinine 1.15, hgb 11.4, LDH 199, INR 1.9 Labs (5/20): hgb 10.4, WBCs 8.5 Labs (6/20): K 4.2, creatinine 1.06, hgb 11.2 Labs (8/20): k 3.7, creatinine 1.0, hgb 11.5, LDH 186 Labs (10/20): K 3.5, creatinine 0.93, INR 2, LDH 221 Labs (12/20): hgb 15.6, K 3.7, creatinine 1.02, LDH 250 Labs (4/21): K 4.1, creatinine 0.86, hgb 11.1 Labs (6/21): K 4.1, creatinine 0.76, hgb 10, WBCs 11.2 Labs (8/21): hgb 10.6 Labs (10/21): hgb 13.2  PMH: 1. Degenerative disc disease.  2. GERD 3. Chronic systolic CHF:  Nonischemic cardiomyopathy.  Dilated cardiomyopathy diagnosed 6/19 in [place].  LHC/RHC in 7/19 showed elevated filling pressures, low cardiac output, and no significant CAD.   - Echo (9/19): Severe LV dilation with EF 10-20%, moderate-severe MR, severely dilated RV with mildly decreased systolic function, severe TR.  - Cardiac MRI (9/19): EF 14%, moderate dilated LV, severely dilated RV with mod-severe systolic function and EF 25%, nonspecific RV  insertion site LGE.  - RHC (5/19) on milrinone 0.375: mean RA 8, PA 40/19, mean PCWP 12, PAPi 2.65, CI 2.71.  - Echo (9/19) on milrinone and diuresed): EF 20-25% with moderate LV dilation, moderately dilated RV with mildly decreased systolic function, mild-moderate MR, moderate TR, PASP 51 mmHg.  Cannot rule out noncompaction.  - Heartmate 3 LVAD placement in 9/19.  4. OSA: CPAP use.  5. Prior smoker 6. Type 2 diabetes 7. Hyperlipidemia.  8. H/o NSVT 9. 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  10. RLE DVT: found in 9/19.  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 Subxiphoid abscess involving collection along driveline in 3/21, MSSA.  6/21 Rectus flap to cover abscess site.  12. Facial Zoster 3/21 13. 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   Current Outpatient Medications  Medication Sig Dispense Refill  .  acetaminophen (TYLENOL) 500 MG tablet Take 1,000 mg by mouth every 6 (six) hours as needed for mild pain.    Marland Kitchen buPROPion (WELLBUTRIN SR) 150 MG 12 hr tablet Take 1 tablet (150 mg total) by mouth 2 (two) times daily. TAKE ONE TABLET DAILY FOR 3 DAYS AND THEN INCREASE TO ONE TABLET TWICE DAILY 60 tablet 3  . carbamide peroxide (DEBROX) 6.5 % OTIC solution Place 5 drops into both ears 3 (three) times daily. 15 mL 0  . cephALEXin (KEFLEX) 500 MG capsule Take 1 capsule (500 mg total) by mouth 4 (four) times daily. Restart medication 06/25/20 120 capsule 5  . cyanocobalamin 500 MCG tablet Take 1,000 mcg by mouth daily.     . fluticasone (FLONASE) 50 MCG/ACT nasal spray Place 1 spray into both nostrils 2 (two) times daily as needed for allergies.     Marland Kitchen gabapentin (NEURONTIN) 300 MG capsule Take 2 capsules (600 mg total) by mouth 4 (four) times daily. 240 capsule 6  . glucose blood (CONTOUR NEXT TEST) test strip Use as instructed 100 each 12  . hydrALAZINE (APRESOLINE) 50 MG tablet Take 1.5 tablets (75 mg total) by mouth 3 (three) times daily. 90 tablet 5  . hydrOXYzine (ATARAX/VISTARIL) 50 MG tablet Take 1 tablet (50 mg total) by mouth 3 (three) times daily as needed for itching. (Patient taking differently: Take 50 mg by mouth 2 (two) times daily as needed for itching. ) 40 tablet 3  . Ipratropium-Albuterol (COMBIVENT) 20-100 MCG/ACT AERS respimat Inhale 1 puff into the lungs every 6 (six) hours as needed for wheezing or shortness of breath.     . Lancets (FREESTYLE) lancets Use as instructed 100 each 12  . nystatin (MYCOSTATIN/NYSTOP) powder Apply 1 application topically 3 (three) times daily. 15 g 3  . pantoprazole (PROTONIX) 40 MG tablet Take 1 tablet (40 mg total) by mouth 2 (two) times daily. Take 40 mg twice daily for 8 weeks (2 months) and then back down to 40 mg daily (Patient taking differently: Take 40 mg by mouth daily. ) 60 tablet 2  . sacubitril-valsartan (ENTRESTO) 49-51 MG Take 1 tablet by mouth  2 (two) times daily. 60 tablet 11  . sildenafil (VIAGRA) 100 MG tablet Take 0.5 tablets (50 mg total) by mouth daily as needed for erectile dysfunction. 10 tablet 6  . spironolactone (ALDACTONE) 25 MG tablet Take 1 tablet (25 mg total) by mouth daily. 30 tablet 5  . thiamine 100 MG tablet Take 1 tablet (100 mg total) by mouth daily. 30 tablet 0  . warfarin (COUMADIN) 5 MG tablet Take  5 mg (1 tab) every day or as directed by HF Clinic. 100 tablet 3  . Zinc Gluconate 100 MG TABS Take 1 tablet (100 mg total) by mouth daily. 90 tablet 3  . zolpidem (AMBIEN) 5 MG tablet TAKE 1 TABLET BY MOUTH EVERY DAY AT BEDTIME (Patient taking differently: Take 5 mg by mouth at bedtime. ) 30 tablet 2  . docusate sodium (COLACE) 100 MG capsule Take 2 capsules (200 mg total) by mouth daily as needed for mild constipation. (Patient not taking: Reported on 09/12/2020) 10 capsule 0  . oxyCODONE (OXY IR/ROXICODONE) 5 MG immediate release tablet Take 1-2 tablets (5-10 mg total) by mouth every 4 (four) hours as needed for moderate pain. (Patient not taking: Reported on 09/12/2020) 30 tablet 0  . traMADol (ULTRAM) 50 MG tablet Take 2 tablets (100 mg total) by mouth every 6 (six) hours as needed for severe pain. (Patient not taking: Reported on 09/12/2020) 20 tablet 0   No current facility-administered medications for this encounter.   Facility-Administered Medications Ordered in Other Encounters  Medication Dose Route Frequency Provider Last Rate Last Admin  . octreotide (SANDOSTATIN LAR) IM injection 20 mg  20 mg Intramuscular Q28 days Laurey Morale, MD   20 mg at 09/12/20 1211    Chlorhexidine and Other  REVIEW OF SYSTEMS: All systems negative except as listed in HPI, PMH and Problem list.   LVAD INTERROGATION:  Please see LVAD nurse's note above for details.    I reviewed the LVAD parameters from today, and compared the results to the patient's prior recorded data.  No programming changes were made.  The LVAD is  functioning within specified parameters.  The patient performs LVAD self-test daily.  LVAD interrogation was negative for any significant power changes, alarms or PI events/speed drops.  LVAD equipment check completed and is in good working order.  Back-up equipment present.   LVAD education done on emergency procedures and precautions and reviewed exit site care.   MAP 100  Physical Exam: General: Well appearing this am. NAD.  HEENT: Normal. Neck: Supple, JVP 7-8 cm. Carotids OK.  Cardiac:  Mechanical heart sounds with LVAD hum present.  Lungs:  CTAB, normal effort.  Abdomen:  NT, ND, no HSM. No bruits or masses. +BS  LVAD exit site: Well-healed and incorporated. Dressing dry and intact. No erythema or drainage. Stabilization device present and accurately applied. Driveline dressing changed daily per sterile technique. Extremities:  Warm and dry. No cyanosis, clubbing, rash, or edema.  Neuro:  Alert & oriented x 3. Cranial nerves grossly intact. Moves all 4 extremities w/o difficulty. Affect pleasant      ASSESSMENT AND PLAN:   1. 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  LVAD parameters stable.  MAP elevated again today.  NYHA class I-II.   He does not look volume overloaded.  - He does not need Lasix.      - Continue spironolactone 25 mg daily.  - Continue Entresto 49/51 bid.  - Increase hydralazine to 75 mg tid.  - He is off ASA with GI bleeding.  - Continue warfarin for INR 1.8-2.3.  CBC stable today.    - Should be transplant candidate eventually if he can quit smoking.  Will make transplant clinic referral when he is off totally.  2. Smoking: He has cut back a lot but still smoking.  3. RLE DVT: On warfarin for LVAD.  4. OSA: Continue CPAP.  5. Hyperlipidemia: Atorvastatin.  6. Type II diabetes: Per PCP.  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   - He will have long-term  Keflex for MSSA suppression.  Followup with ID.  8. 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 Hgb today stable at 13.2.  - Off ASA, INR goal now 1.8-2.3.  - Continue monthly octreotide injections.   Marca Ancona 09/12/2020

## 2020-09-12 NOTE — Addendum Note (Signed)
Encounter addended by: Lebron Quam, RN on: 09/12/2020 1:52 PM  Actions taken: Clinical Note Signed

## 2020-09-12 NOTE — Patient Instructions (Signed)
1. Increase Hydralazine to 75 mg three times a day (1 and half tablets) 2. Change Mepilex dressing weekly or as needed if soiled. Continue twice weekly driveline dressings. 3. Return to clinic in 2 months

## 2020-09-12 NOTE — Addendum Note (Signed)
Encounter addended by: Laurey Morale, MD on: 09/12/2020 10:52 PM  Actions taken: Level of Service modified, Charge Capture section accepted, Clinical Note Signed

## 2020-09-12 NOTE — Addendum Note (Signed)
Encounter addended by: Lebron Quam, RN on: 09/12/2020 9:40 AM  Actions taken: Order list changed, Order Reconciliation Section accessed, Home Medications modified, Medication List reviewed, Medication taking status modified, Clinical Note Signed

## 2020-09-18 ENCOUNTER — Ambulatory Visit (HOSPITAL_COMMUNITY): Payer: Self-pay | Admitting: Pharmacist

## 2020-09-18 LAB — POCT INR: INR: 2.2 (ref 2.0–3.0)

## 2020-09-18 NOTE — Progress Notes (Signed)
LVAD INR 

## 2020-09-25 ENCOUNTER — Ambulatory Visit (HOSPITAL_COMMUNITY): Payer: Self-pay | Admitting: Pharmacist

## 2020-09-25 LAB — POCT INR: INR: 2.8 (ref 2.0–3.0)

## 2020-09-25 NOTE — Progress Notes (Signed)
LVAD INR 

## 2020-10-02 ENCOUNTER — Ambulatory Visit (HOSPITAL_COMMUNITY): Payer: Self-pay | Admitting: Pharmacist

## 2020-10-02 LAB — POCT INR: INR: 3.3 — AB (ref 2.0–3.0)

## 2020-10-02 NOTE — Progress Notes (Signed)
LVAD INR 

## 2020-10-09 ENCOUNTER — Ambulatory Visit (HOSPITAL_COMMUNITY): Payer: Self-pay | Admitting: Pharmacist

## 2020-10-09 LAB — POCT INR: INR: 3 (ref 2.0–3.0)

## 2020-10-09 MED ORDER — WARFARIN SODIUM 5 MG PO TABS: ORAL_TABLET | ORAL | 3 refills | Status: DC

## 2020-10-09 NOTE — Progress Notes (Signed)
LVAD INR 

## 2020-10-10 ENCOUNTER — Encounter (HOSPITAL_COMMUNITY): Payer: No Typology Code available for payment source

## 2020-10-10 ENCOUNTER — Other Ambulatory Visit: Payer: Self-pay

## 2020-10-10 ENCOUNTER — Encounter (HOSPITAL_COMMUNITY)
Admission: RE | Admit: 2020-10-10 | Discharge: 2020-10-10 | Disposition: A | Discharge status: Home / Self Care | Payer: No Typology Code available for payment source | Source: Ambulatory Visit | Attending: Cardiology | Admitting: Cardiology

## 2020-10-10 DIAGNOSIS — T827XXA Infection and inflammatory reaction due to other cardiac and vascular devices, implants and grafts, initial encounter: Secondary | ICD-10-CM | POA: Insufficient documentation

## 2020-10-10 DIAGNOSIS — Z7901 Long term (current) use of anticoagulants: Secondary | ICD-10-CM | POA: Diagnosis present

## 2020-10-10 DIAGNOSIS — Z95811 Presence of heart assist device: Secondary | ICD-10-CM | POA: Diagnosis present

## 2020-10-10 DIAGNOSIS — I5022 Chronic systolic (congestive) heart failure: Secondary | ICD-10-CM | POA: Insufficient documentation

## 2020-10-10 MED ORDER — OCTREOTIDE ACETATE 20 MG IM KIT
20.0000 mg | PACK | INTRAMUSCULAR | Status: DC
  Administered 2020-10-10: 20 mg via INTRAMUSCULAR
  Filled 2020-10-10: qty 1

## 2020-10-16 ENCOUNTER — Ambulatory Visit (HOSPITAL_COMMUNITY): Payer: Self-pay | Admitting: Pharmacist

## 2020-10-16 LAB — POCT INR: INR: 2.4 (ref 2.0–3.0)

## 2020-10-16 NOTE — Progress Notes (Signed)
LVAD INR 

## 2020-10-23 ENCOUNTER — Ambulatory Visit (HOSPITAL_COMMUNITY): Payer: Self-pay | Admitting: Pharmacist

## 2020-10-23 LAB — POCT INR: INR: 2.6 (ref 2.0–3.0)

## 2020-10-23 NOTE — Progress Notes (Signed)
LVAD INR 

## 2020-10-30 ENCOUNTER — Ambulatory Visit (HOSPITAL_COMMUNITY): Payer: Self-pay | Admitting: Pharmacist

## 2020-10-30 ENCOUNTER — Ambulatory Visit (INDEPENDENT_AMBULATORY_CARE_PROVIDER_SITE_OTHER): Payer: No Typology Code available for payment source | Admitting: Infectious Diseases

## 2020-10-30 ENCOUNTER — Other Ambulatory Visit: Payer: Self-pay

## 2020-10-30 ENCOUNTER — Encounter: Payer: Self-pay | Admitting: Infectious Diseases

## 2020-10-30 VITALS — BP 93/65 | HR 90 | Temp 98.0°F | Resp 15 | Ht 71.0 in | Wt 200.0 lb

## 2020-10-30 DIAGNOSIS — T827XXA Infection and inflammatory reaction due to other cardiac and vascular devices, implants and grafts, initial encounter: Secondary | ICD-10-CM

## 2020-10-30 LAB — POCT INR: INR: 2 (ref 2.0–3.0)

## 2020-10-30 NOTE — Progress Notes (Signed)
LVAD INR 

## 2020-10-30 NOTE — Progress Notes (Signed)
Patient: Theodore Welch  DOB: [birth year] MRN: [id number] PCP: [name], MD    Subjective:  CC: Hospital follow up MSSA abscess involving proximal driveline s/p repeat debridement.    ID Hx: Theodore Welch is a 59 y.o. 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    January-2020: MSSA infection of counter incision to mid abdomen; s/p serial debridements. Initially did not involve exit site at this time. Received IV Cefazolin x 4 weeks --> PO Cephalexin x 4 weeks.   May-2020: recurrent MSSA infection, now involving driveline exit site and secondary bacteremia. TEE deferred due to low sensitivity for detecting VAD endocarditis. 6 weeks IV Cefazolin s/p abd wall debridement --> cephalexin for chronic suppression for duration pump is in place.    March-2021 sudden onset subxiphoid abscess involving proximal driveline, MSSA on cultures. Blood cultures negative. S/P I&D of abdominal wall exit site and subxiphoid space. IV cefazolin x 8 weeks (May 10th 2021).  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    Apr 16, 2020 - readmitted following re-opening and bleeding of subxiphoid wound. +MSSA and taken for debridement 6/01. Required repair of outflow graft at this surgery as well (?sharp puncture vs degenerated graft d/t infection). Vertical Rectus Flap repair performed with serial debridements. BC (-). PICC Cefazolin + Rifabutin planned through 7/7   April 27, 2020 - bleeding from sternum r/t subxiphoid abscess --> debridement in OR, Cx again grew out MSSA. Back on IV cefazolin for treatment.     HPI: Theodore Welch and Theodore Welch are here for routine follow up on chronic suppression with cephalexin. Contineus to take this 4 times a day. Has been doing INRs at home with POC machine; lower goal now due to significant  GI bleeding.  He has not had any illnesses since recent discharge. Had covid booster and flu shot.   Theodore Welch says that his drainage has improved significantly from the left lower driveline incision. The two small holes along midline scar incision are healing up - the bottom one is nearly closed now, however the top still has some drainage on the bandage. Changes these about once a week. No fevers/chills.   Theodore Welch describes some intermittent swelling along the epigastric space where the flap repair was. This is never painful, erythematous or warm. It is more pronounced when he eats too much in his opinion. Wants to know how long that swelling may continue.  Original surgery for rectus muscle flap was 04/27/2020.    Review of Systems  Constitutional: Negative for chills and fever.  HENT: Negative for tinnitus.   Eyes: Negative for blurred vision and photophobia.  Respiratory: Negative for cough and sputum production.   Cardiovascular: Negative for chest pain.  Gastrointestinal: Negative for diarrhea, nausea and vomiting.  Genitourinary: Negative for dysuria.  Skin: Negative for rash.  Neurological: Negative for headaches.   Past Medical History:  Diagnosis Date  . 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 Cardiomyopathy, unspecified (HCC)   . CHF (congestive heart failure) (HCC)   . Chronic back pain   . Diabetes mellitus without complication (HCC)   . Enlarged heart   . Gastric ulcer   . Gastroenteritis   . H/O degenerative disc disease   . Hypertension   . LVAD (left ventricular assist device) present [facility])     Outpatient Medications Prior to  Visit  Medication Sig Dispense Refill  . acetaminophen (TYLENOL) 500 MG tablet Take 1,000 mg by mouth every 6 (six) hours as needed for mild pain.    Marland Kitchen buPROPion (WELLBUTRIN SR) 150 MG 12 hr tablet Take 1 tablet (150 mg total) by mouth 2 (two) times daily. TAKE ONE TABLET DAILY FOR 3 DAYS AND THEN INCREASE TO ONE TABLET TWICE DAILY (Patient not  taking: Reported on 11/14/2020) 60 tablet 3  . carbamide peroxide (DEBROX) 6.5 % OTIC solution Place 5 drops into both ears 3 (three) times daily. (Patient not taking: Reported on 11/14/2020) 15 mL 0  . cephALEXin (KEFLEX) 500 MG capsule Take 1 capsule (500 mg total) by mouth 4 (four) times daily. Restart medication 06/25/20 120 capsule 5  . cyanocobalamin 500 MCG tablet Take 1,000 mcg by mouth daily.     Marland Kitchen docusate sodium (COLACE) 100 MG capsule Take 2 capsules (200 mg total) by mouth daily as needed for mild constipation. (Patient not taking: No sig reported) 10 capsule 0  . fluticasone (FLONASE) 50 MCG/ACT nasal spray Place 1 spray into both nostrils 2 (two) times daily as needed for allergies.     Marland Kitchen glucose blood (CONTOUR NEXT TEST) test strip Use as instructed (Patient not taking: Reported on 11/14/2020) 100 each 12  . hydrALAZINE (APRESOLINE) 50 MG tablet Take 1.5 tablets (75 mg total) by mouth 3 (three) times daily. 90 tablet 5  . hydrOXYzine (ATARAX/VISTARIL) 50 MG tablet Take 1 tablet (50 mg total) by mouth 3 (three) times daily as needed for itching. (Patient taking differently: Take 50 mg by mouth 2 (two) times daily as needed for itching.) 40 tablet 3  . Ipratropium-Albuterol (COMBIVENT) 20-100 MCG/ACT AERS respimat Inhale 1 puff into the lungs every 6 (six) hours as needed for wheezing or shortness of breath.     . Lancets (FREESTYLE) lancets Use as instructed (Patient not taking: Reported on 11/14/2020) 100 each 12  . nystatin (MYCOSTATIN/NYSTOP) powder Apply 1 application topically 3 (three) times daily. 15 g 3  . pantoprazole (PROTONIX) 40 MG tablet Take 1 tablet (40 mg total) by mouth 2 (two) times daily. Take 40 mg twice daily for 8 weeks (2 months) and then back down to 40 mg daily (Patient taking differently: Take 40 mg by mouth daily.) 60 tablet 2  . sacubitril-valsartan (ENTRESTO) 49-51 MG Take 1 tablet by mouth 2 (two) times daily. 60 tablet 11  . sildenafil (VIAGRA) 100 MG tablet  Take 0.5 tablets (50 mg total) by mouth daily as needed for erectile dysfunction. 10 tablet 6  . spironolactone (ALDACTONE) 25 MG tablet Take 1 tablet (25 mg total) by mouth daily. 30 tablet 5  . thiamine 100 MG tablet Take 1 tablet (100 mg total) by mouth daily. 30 tablet 0  . warfarin (COUMADIN) 5 MG tablet Take 2.5 mg (1/2 tab) every Tuesday and 5 mg (1 tab) all other days or as directed by HF Clinic. 100 tablet 3  . Zinc Gluconate 100 MG TABS Take 1 tablet (100 mg total) by mouth daily. 90 tablet 3  . zolpidem (AMBIEN) 5 MG tablet TAKE 1 TABLET BY MOUTH EVERY DAY AT BEDTIME (Patient taking differently: Take 5 mg by mouth at bedtime.) 30 tablet 2  . gabapentin (NEURONTIN) 300 MG capsule Take 2 capsules (600 mg total) by mouth 4 (four) times daily. 240 capsule 6  . oxyCODONE (OXY IR/ROXICODONE) 5 MG immediate release tablet Take 1-2 tablets (5-10 mg total) by mouth every 4 (four) hours as  needed for moderate pain. (Patient not taking: Reported on 09/12/2020) 30 tablet 0  . traMADol (ULTRAM) 50 MG tablet Take 2 tablets (100 mg total) by mouth every 6 (six) hours as needed for severe pain. (Patient not taking: No sig reported) 20 tablet 0   No facility-administered medications prior to visit.     Allergies  Allergen Reactions  . Chlorhexidine Rash  . Other Rash    Prep pads    Social History   Tobacco Use  . Smoking status: Current Every Day Smoker    Packs/day: 0.25    Types: Cigarettes    Start date: 2003  . Smokeless tobacco: Never Used  . Tobacco comment: slowed down  Vaping Use  . Vaping Use: Never used  Substance Use Topics  . Alcohol use: Not Currently  . Drug use: Not Currently     Objective:   Vitals:   10/30/20 1056  BP: 93/65  Pulse: 90  Resp: 15  Temp: 98 F (36.7 C)  SpO2: 98%  Weight: 200 lb (90.7 kg)  Height: 5\' 11"  (1.803 m)   Body mass index is 27.89 kg/m.  Physical Exam Vitals reviewed.  Constitutional:      Appearance: He is well-developed  and well-nourished.     Comments: Seated comfortably in chair during visit.   HENT:     Mouth/Throat:     Mouth: Oropharynx is clear and moist and mucous membranes are normal.     Dentition: Normal dentition. No dental abscesses.  Cardiovascular:     Rate and Rhythm: Normal rate and regular rhythm.     Heart sounds: Normal heart sounds.  Pulmonary:     Effort: Pulmonary effort is normal.     Breath sounds: Normal breath sounds.  Abdominal:     General: There is no distension.     Palpations: Abdomen is soft.     Tenderness: There is no abdominal tenderness.     Comments: Abdominal dressings dry and clean. Small bandaid overlying right quadrant wound.   Lymphadenopathy:     Cervical: No cervical adenopathy.  Skin:    General: Skin is warm and dry.     Findings: No rash.  Neurological:     Mental Status: He is alert and oriented to person, place, and time.  Psychiatric:        Mood and Affect: Mood and affect normal.        Judgment: Judgment normal.     Comments: In good spirits today and engaged in care discussion.      Lab Results: Lab Results  Component Value Date   WBC 8.0 11/14/2020   HGB 12.5 (L) 11/14/2020   HCT 36.5 (L) 11/14/2020   MCV 85.7 11/14/2020   PLT 245 11/14/2020    Lab Results  Component Value Date   CREATININE 1.08 11/14/2020   BUN 14 11/14/2020   NA 137 11/14/2020   K 4.2 11/14/2020   CL 106 11/14/2020   CO2 21 (L) 11/14/2020    Sed Rate  Date Value  09/12/2020 2 mm/hr  08/15/2020 4 mm/hr  05/23/2020 6 mm/h   CRP  Date Value  08/15/2020 1.3 mg/dL (H)  08/17/2020 19/14/7829 mg/L (H)  02/21/2020 1.2 mg/dL (H)     Assessment & Plan:   Problem List Items Addressed This Visit      High   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 Inflammatory markers  improved significantly. Chest overlying sternal flap with expected defect - nothing to suspect active  infection. Continues to have intermittent drainage around driveline; likely due to biofilms from staph aureus. Increase dressing care as directed from LVAD team if drainage increases to try to avoid local infection.  Continue cephalexin for now - hopeful to drop to TID dosing. Would like to see some of the chronic open spots heal up.  Return in about 3 months (around 01/28/2021).          Rexene Alberts, MSN, NP-C [facility]  [email or web address] Pager: [phone] Office: [phone] [facility] Main Line: [phone]

## 2020-10-30 NOTE — Patient Instructions (Addendum)
Will send a request to the LVAD team to add some labs for me at your next visit with them.   No changes for now- continue the cephalexin 4 times a day for now.   I hope you have a happy holiday and see you next year! Will talk about the next follow up visit after we see how things are at your check in with the VAD clinic on the 28th.

## 2020-11-06 ENCOUNTER — Ambulatory Visit (HOSPITAL_COMMUNITY): Payer: Self-pay | Admitting: Pharmacist

## 2020-11-06 LAB — POCT INR: INR: 2.2 (ref 2.0–3.0)

## 2020-11-06 NOTE — Progress Notes (Signed)
LVAD INR 

## 2020-11-07 ENCOUNTER — Encounter (HOSPITAL_COMMUNITY)
Admission: RE | Admit: 2020-11-07 | Discharge: 2020-11-07 | Disposition: A | Discharge status: Home / Self Care | Payer: No Typology Code available for payment source | Source: Ambulatory Visit | Attending: Cardiology | Admitting: Cardiology

## 2020-11-07 DIAGNOSIS — T827XXA Infection and inflammatory reaction due to other cardiac and vascular devices, implants and grafts, initial encounter: Secondary | ICD-10-CM | POA: Diagnosis present

## 2020-11-07 DIAGNOSIS — I5022 Chronic systolic (congestive) heart failure: Secondary | ICD-10-CM | POA: Diagnosis present

## 2020-11-07 DIAGNOSIS — Z95811 Presence of heart assist device: Secondary | ICD-10-CM | POA: Insufficient documentation

## 2020-11-07 DIAGNOSIS — Z7901 Long term (current) use of anticoagulants: Secondary | ICD-10-CM | POA: Insufficient documentation

## 2020-11-07 MED ORDER — OCTREOTIDE ACETATE 20 MG IM KIT
20.0000 mg | PACK | INTRAMUSCULAR | Status: DC
  Administered 2020-11-07: 20 mg via INTRAMUSCULAR
  Filled 2020-11-07: qty 1

## 2020-11-08 ENCOUNTER — Other Ambulatory Visit (HOSPITAL_COMMUNITY): Payer: Self-pay | Admitting: *Deleted

## 2020-11-08 DIAGNOSIS — Z7901 Long term (current) use of anticoagulants: Secondary | ICD-10-CM

## 2020-11-08 DIAGNOSIS — Z95811 Presence of heart assist device: Secondary | ICD-10-CM

## 2020-11-13 ENCOUNTER — Ambulatory Visit (HOSPITAL_COMMUNITY): Payer: Self-pay | Admitting: Pharmacist

## 2020-11-13 LAB — POCT INR: INR: 2.5 (ref 2.0–3.0)

## 2020-11-13 NOTE — Progress Notes (Signed)
LVAD INR 

## 2020-11-14 ENCOUNTER — Other Ambulatory Visit: Payer: Self-pay

## 2020-11-14 ENCOUNTER — Ambulatory Visit (HOSPITAL_COMMUNITY)
Admission: RE | Admit: 2020-11-14 | Discharge: 2020-11-14 | Disposition: A | Discharge status: Home / Self Care | Payer: No Typology Code available for payment source | Source: Ambulatory Visit | Attending: Cardiology | Admitting: Cardiology

## 2020-11-14 VITALS — BP 114/92 | HR 72 | Wt 213.0 lb

## 2020-11-14 DIAGNOSIS — G4733 Obstructive sleep apnea (adult) (pediatric): Secondary | ICD-10-CM | POA: Insufficient documentation

## 2020-11-14 DIAGNOSIS — E119 Type 2 diabetes mellitus without complications: Secondary | ICD-10-CM | POA: Diagnosis not present

## 2020-11-14 DIAGNOSIS — E785 Hyperlipidemia, unspecified: Secondary | ICD-10-CM | POA: Insufficient documentation

## 2020-11-14 DIAGNOSIS — Z79899 Other long term (current) drug therapy: Secondary | ICD-10-CM | POA: Insufficient documentation

## 2020-11-14 DIAGNOSIS — I5022 Chronic systolic (congestive) heart failure: Secondary | ICD-10-CM | POA: Diagnosis not present

## 2020-11-14 DIAGNOSIS — Z7901 Long term (current) use of anticoagulants: Secondary | ICD-10-CM | POA: Diagnosis not present

## 2020-11-14 DIAGNOSIS — I428 Other cardiomyopathies: Secondary | ICD-10-CM | POA: Diagnosis not present

## 2020-11-14 DIAGNOSIS — Z86718 Personal history of other venous thrombosis and embolism: Secondary | ICD-10-CM | POA: Insufficient documentation

## 2020-11-14 DIAGNOSIS — F17210 Nicotine dependence, cigarettes, uncomplicated: Secondary | ICD-10-CM | POA: Insufficient documentation

## 2020-11-14 DIAGNOSIS — Z95811 Presence of heart assist device: Secondary | ICD-10-CM | POA: Diagnosis not present

## 2020-11-14 LAB — CBC
HCT: 36.5 % — ABNORMAL LOW (ref 39.0–52.0)
Hemoglobin: 12.5 g/dL — ABNORMAL LOW (ref 13.0–17.0)
MCH: 29.3 pg (ref 26.0–34.0)
MCHC: 34.2 g/dL (ref 30.0–36.0)
MCV: 85.7 fL (ref 80.0–100.0)
Platelets: 245 10*3/uL (ref 150–400)
RBC: 4.26 MIL/uL (ref 4.22–5.81)
RDW: 15.3 % (ref 11.5–15.5)
WBC: 8 10*3/uL (ref 4.0–10.5)
nRBC: 0 % (ref 0.0–0.2)

## 2020-11-14 LAB — BASIC METABOLIC PANEL
Anion gap: 10 (ref 5–15)
BUN: 14 mg/dL (ref 8–23)
CO2: 21 mmol/L — ABNORMAL LOW (ref 22–32)
Calcium: 8.8 mg/dL — ABNORMAL LOW (ref 8.9–10.3)
Chloride: 106 mmol/L (ref 98–111)
Creatinine, Ser: 1.08 mg/dL (ref 0.61–1.24)
GFR, Estimated: >60 mL/min (ref >60)
Glucose, Bld: 164 mg/dL — ABNORMAL HIGH (ref 70–99)
Potassium: 4.2 mmol/L (ref 3.5–5.1)
Sodium: 137 mmol/L (ref 135–145)

## 2020-11-14 LAB — PROTIME-INR
INR: 2.9 — ABNORMAL HIGH (ref 0.8–1.2)
Prothrombin Time: 29.7 seconds — ABNORMAL HIGH (ref 11.4–15.2)

## 2020-11-14 LAB — LACTATE DEHYDROGENASE: LDH: 217 U/L — ABNORMAL HIGH (ref 98–192)

## 2020-11-14 MED ORDER — GABAPENTIN 300 MG PO CAPS: 600.0000 mg | ORAL_CAPSULE | Freq: Four times a day (QID) | ORAL | 6 refills | Status: DC

## 2020-11-14 NOTE — Patient Instructions (Signed)
1. No medication changes today 2. Coumadin dosing per [name] PharmD 3. Continue twice weekly dressing changes 4. Return to VAD clinic in 2 months for full follow up visit. We will complete your 2.5 year Intermacs at this visit. Please wear tennis shoes to complete your 6 minute walk.

## 2020-11-14 NOTE — Progress Notes (Addendum)
Patient presents for 2 mo f/u in VAD Clinic today with his wife. Reports no problems with VAD equipment or concerns with drive line.  Pt states that he has been feeling great lately. Denies any SOB, or blood in his stool. pts wounds are healing well. See wound note below. Reports lightheaded episodes when he bends over, but these resolve with position change.   Reports he is still smoking. He has stopped taking the Wellbutrin due to "feeling like a zombie" and having nightmares. Encouraged complete cessation.   Recently had right great toe nail removed due to ingrown nail at the [place]. Site healing, with no signs of infection present.   Large tear of drive line outer covering noted near where drive line attaches to controller. Repaired with rescue tape. Provided patient with extra piece of tape in case tape falls off.    BP slightly elevated this morning. Reports he ran out of [place] after taking his dose last night. Plans to pick up refill from the pharmacy this morning. Will not make any medication changes today.     Pt had LOW FLOW this morning at 0700. He reports he rolled over in his sleep and was on his left side, and "my cords were all bent up and pinned underneath me." When he changed positions alarm resolved. No speed drops noted on interrogation.   Vital Signs:  Doppler Pressure: 100 Automatc BP: 114/92 (101) HR: 72 SPO2: 97% RA  Weight: 213 lb w/o eqt Last weight: 213.8 lb  VAD Indication: Destination Therapy due to smoking status  VAD interrogation & Equipment Management: Speed: 5700 Flow:  4.4 Power:  4.6w    PI: 4.4 Hct: 41  Alarms: 1 LOW FLOW this morning Events: 100+ PI events today Fixed speed: 5700 Low speed limit: 5400  Primary Controller:  Replace back up battery in 32 months. Back up controller:   Replace back up battery in 17 months.  Annual Equipment Maintenance on UBC/PM was performed on 07/2020.   I reviewed the LVAD parameters from today and  compared the results to the patient's prior recorded data. LVAD interrogation was NEGATIVE for significant power changes, NEGATIVE for clinical alarms and NEGATIVE for PI events/speed drops. No programming changes were made and pump is functioning within specified parameters. Pt is performing daily controller and system monitor self tests along with completing weekly and monthly maintenance for LVAD equipment.  LVAD equipment check completed and is in good working order. Back-up equipment present.   Abdominal incision care: Abdominal incision with gauze dressing in place packed with plain packing gauze. Dressing removed. No drainage from open areas. Cleansed with betadine. Opened area at top of area tunnels 1 cm. Opened area on the bottom of incision with 1 cm in diameter; with skin completely intact in wound bed.  Packed with plain packing strip and covered w/MepilexWife instructed to change weekly or prn when visibly soiled.    Exit Site Care: Drive line is being maintained daily by [name]. Gauze dressing with anchor intact and accurately applied. Existing dressing removed using sterile technique. Site cleansed with betadine swab x 2 and allowed to dry. Topped with dry 4 x 4. Scant amount of thick, brown/yellow drainage noted. No foul odor or tenderness. Exit site with moderate tissue ingrowth. Anchor changed. Continue twice weekly dressing changes using saline moinstened 2 x 2. Covered with gauze dressing. Anchor intact. Patient given 14 daily dressing kits, 15 anchors, sterile saline wipes, and 7 Mepilex [employer].  Device: N/A   BP & Labs:  Doppler 100 - Doppler is reflecting MAP   Hgb 12.5 - No S/S of bleeding. Specifically denies BRBPR.    LDH stable at 217 with established baseline of 150 - 220. Denies tea-colored urine. No power elevations noted on interrogation.   Patient Instructions: 1. No medication changes today 2. Coumadin dosing per [name] PharmD 3. Continue  twice weekly dressing changes 4. Return to VAD clinic in 2 months for full follow up visit. We will complete your 2.5 year Intermacs at this visit. Please wear tennis shoes to complete your 6 minute walk.    Alyce Pagan RN VAD Coordinator  Office: [phone]  24/7 Pager: [phone]    59 y.o. with history of nonischemic cardiomyopathy, RLE DVT, cirrhosis, smoking, and OSA returns for followup of CHF/LVAD placement.  Cardiomyopathy was diagnosed in 6/19 in [place], [place] at that time showed low output.  He was admitted to [facility] in 9/19 with low output HF and was started on milrinone and diuresed.  Unable to wean off milrinone.  He had a degree of RV failure, but this improved on milrinone.  Valvular heart disease also looked better with milrinone and diuresis.  On 08/03/18, he had Heartmate 3 LVAD placed.  Speed was optimized by ramp echo post-op.  Post-op course was relatively unremarkable.  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  No abscess on CT.  He was started on cefazolin IV for 6 wks.  BP-active meds decreased with low MAP.   He was admitted in 3/21 with subxiphoid abscess and cellulitis.  CT showed collection along the course of the driveline.  There was no evidence for sternal osteomyelitis.  Abscess was debrided, MSSA grew from wound cultures.  Wound vac was placed.  Patient was started on cefazolin, which was recently stopped after completing 8 wks.   Patient was re-admitted later in 3/21 with facial Zoster.  He was treated with acyclovir and this has resolved.   He was admitted in 6/21 with bleeding from the site of the subxiphoid abscess as well as cellulitis.  He required multiple units of PRBCs.  He went to the OR initially for I&D.  Blood and wound cultures grew MSSA.  He then went back to the OR for rectus flap over wound site (plastics).  He was started on cefazolin and rifabutin, has  PICC.   He was admitted in 7/21 with GI bleeding, he had 6 units PRBCs.  EGD showed duodenal AVMs, treated with APC.  INR goal with warfarin lowered to 1.8-2.3.   He returns for LVAD followup.   Still smoking a couple of cigarettes/day, unable to tolerate Wellbutrin (felt like a "zombie").  Driveline site is benign, wound site in abdomen is slowly healing. Shortness of breath only with moderate to heavy exertion.  No orthopnea/PND.  No lightheadedness.  MAP high today, had more PI events than usual today (MAP 100, out of Entresto).  No BRBPR/melena, he is on octreotide.   Denies LVAD alarms.  Reports taking Coumadin as prescribed and adherence to anticoagulation based dietary restrictions.  Denies bright red blood per rectum or melena, no dark urine or hematuria.    Labs (9/19): LDH 210, INR 2.17, WBCs 17.1 => 15, hgb 9.7, creatinine 0.72 Labs (10/19): K 4.3, creatinine 0.78 => 0.83, hgb 10.2 Labs (11/19): creatinine 0.79, hgb 10 Labs (2/20): K 3.9, creatinine 0.79 Labs (4/20): K  4, creatinine 1.15, hgb 11.4, LDH 199, INR 1.9 Labs (5/20): hgb 10.4, WBCs 8.5 Labs (6/20): K 4.2, creatinine 1.06, hgb 11.2 Labs (8/20): k 3.7, creatinine 1.0, hgb 11.5, LDH 186 Labs (10/20): K 3.5, creatinine 0.93, INR 2, LDH 221 Labs (12/20): hgb 15.6, K 3.7, creatinine 1.02, LDH 250 Labs (4/21): K 4.1, creatinine 0.86, hgb 11.1 Labs (6/21): K 4.1, creatinine 0.76, hgb 10, WBCs 11.2 Labs (8/21): hgb 10.6 Labs (10/21): hgb 13.2, creatinine 1.17  PMH: 1. Degenerative disc disease.  2. GERD 3. Chronic systolic CHF:  Nonischemic cardiomyopathy.  Dilated cardiomyopathy diagnosed 6/19 in [place].  LHC/RHC in 7/19 showed elevated filling pressures, low cardiac output, and no significant CAD.   - Echo (9/19): Severe LV dilation with EF 10-20%, moderate-severe MR, severely dilated RV with mildly decreased systolic function, severe TR.  - Cardiac MRI (9/19): EF 14%, moderate dilated LV, severely dilated RV with  mod-severe systolic function and EF 25%, nonspecific RV insertion site LGE.  - RHC (5/19) on milrinone 0.375: mean RA 8, PA 40/19, mean PCWP 12, PAPi 2.65, CI 2.71.  - Echo (9/19) on milrinone and diuresed): EF 20-25% with moderate LV dilation, moderately dilated RV with mildly decreased systolic function, mild-moderate MR, moderate TR, PASP 51 mmHg.  Cannot rule out noncompaction.  - Heartmate 3 LVAD placement in 9/19.  4. OSA: CPAP use.  5. Prior smoker 6. Type 2 diabetes 7. Hyperlipidemia.  8. H/o NSVT 9. 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  10. RLE DVT: found in 9/19.  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 Subxiphoid abscess involving collection along driveline in 3/21, MSSA.  6/21 Rectus flap to cover abscess site.  12. Facial Zoster 3/21 13. 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   Current Outpatient Medications  Medication Sig Dispense Refill  . acetaminophen (TYLENOL) 500 MG tablet Take 1,000 mg by mouth every 6 (six) hours as needed for mild pain.    . cephALEXin (KEFLEX) 500 MG capsule Take 1 capsule (500 mg total) by mouth 4 (four) times daily. Restart medication 06/25/20 120 capsule 5  . cyanocobalamin 500 MCG tablet Take 1,000 mcg by mouth daily.     . fluticasone (FLONASE) 50 MCG/ACT nasal spray Place 1 spray into both nostrils 2 (two) times daily as needed for allergies.     . hydrALAZINE (APRESOLINE) 50 MG tablet Take 1.5 tablets (75 mg total) by mouth 3 (three) times daily. 90 tablet 5  . hydrOXYzine (ATARAX/VISTARIL) 50 MG tablet Take 1 tablet (50 mg total) by mouth 3 (three) times daily as needed for itching. (Patient taking differently: Take 50 mg by mouth 2 (two) times daily as needed for itching.) 40 tablet 3  . Ipratropium-Albuterol (COMBIVENT) 20-100 MCG/ACT AERS respimat Inhale 1 puff into the lungs every 6 (six) hours as needed for wheezing or shortness of breath.     . nystatin  (MYCOSTATIN/NYSTOP) powder Apply 1 application topically 3 (three) times daily. 15 g 3  . pantoprazole (PROTONIX) 40 MG tablet Take 1 tablet (40 mg total) by mouth 2 (two) times daily. Take 40 mg twice daily for 8 weeks (2 months) and then back down to 40 mg daily (Patient taking differently: Take 40 mg by mouth daily.) 60 tablet 2  . sacubitril-valsartan (ENTRESTO) 49-51 MG Take 1 tablet by mouth 2 (two) times daily. 60 tablet 11  . sildenafil (VIAGRA) 100 MG tablet Take 0.5 tablets (50 mg total) by mouth daily as  needed for erectile dysfunction. 10 tablet 6  . spironolactone (ALDACTONE) 25 MG tablet Take 1 tablet (25 mg total) by mouth daily. 30 tablet 5  . thiamine 100 MG tablet Take 1 tablet (100 mg total) by mouth daily. 30 tablet 0  . warfarin (COUMADIN) 5 MG tablet Take 2.5 mg (1/2 tab) every Tuesday and 5 mg (1 tab) all other days or as directed by HF Clinic. 100 tablet 3  . Zinc Gluconate 100 MG TABS Take 1 tablet (100 mg total) by mouth daily. 90 tablet 3  . zolpidem (AMBIEN) 5 MG tablet TAKE 1 TABLET BY MOUTH EVERY DAY AT BEDTIME (Patient taking differently: Take 5 mg by mouth at bedtime.) 30 tablet 2  . buPROPion (WELLBUTRIN SR) 150 MG 12 hr tablet Take 1 tablet (150 mg total) by mouth 2 (two) times daily. TAKE ONE TABLET DAILY FOR 3 DAYS AND THEN INCREASE TO ONE TABLET TWICE DAILY (Patient not taking: Reported on 11/14/2020) 60 tablet 3  . carbamide peroxide (DEBROX) 6.5 % OTIC solution Place 5 drops into both ears 3 (three) times daily. (Patient not taking: Reported on 11/14/2020) 15 mL 0  . docusate sodium (COLACE) 100 MG capsule Take 2 capsules (200 mg total) by mouth daily as needed for mild constipation. (Patient not taking: No sig reported) 10 capsule 0  . gabapentin (NEURONTIN) 300 MG capsule Take 2 capsules (600 mg total) by mouth 4 (four) times daily. 240 capsule 6  . glucose blood (CONTOUR NEXT TEST) test strip Use as instructed (Patient not taking: Reported on 11/14/2020) 100  each 12  . Lancets (FREESTYLE) lancets Use as instructed (Patient not taking: Reported on 11/14/2020) 100 each 12   No current facility-administered medications for this encounter.    Chlorhexidine and Other  REVIEW OF SYSTEMS: All systems negative except as listed in HPI, PMH and Problem list.   LVAD INTERROGATION:  Please see LVAD nurse's note above for details.    I reviewed the LVAD parameters from today, and compared the results to the patient's prior recorded data.  No programming changes were made.  The LVAD is functioning within specified parameters.  The patient performs LVAD self-test daily.  LVAD interrogation was negative for any significant power changes, alarms or PI events/speed drops.  LVAD equipment check completed and is in good working order.  Back-up equipment present.   LVAD education done on emergency procedures and precautions and reviewed exit site care.   MAP 100  Physical Exam: General: Well appearing this am. NAD.  HEENT: Normal. Neck: Supple, JVP 7-8 cm. Carotids OK.  Cardiac:  Mechanical heart sounds with LVAD hum present.  Lungs:  CTAB, normal effort.  Abdomen:  NT, ND, no HSM. No bruits or masses. +BS  LVAD exit site: Well-healed and incorporated. Dressing dry and intact. No erythema or drainage. Stabilization device present and accurately applied. Driveline dressing changed daily per sterile technique. Extremities:  Warm and dry. No cyanosis, clubbing, rash, or edema.  Neuro:  Alert & oriented x 3. Cranial nerves grossly intact. Moves all 4 extremities w/o difficulty. Affect pleasant      ASSESSMENT AND PLAN:   1. 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  LVAD parameters stable.  MAP elevated today, likely the cause of the excess PI events today. He is out of [place].  NYHA class II.   He does not look volume overloaded.  - He does not need Lasix.      - Continue  spironolactone 25 mg daily. BMET today.  -  Restart Entresto 49/51 bid.  - Continue hydralazine 75 mg tid.  - He is off ASA with GI bleeding.  - Continue warfarin for INR 1.8-2.3.  CBC today.     - Should be transplant candidate eventually if he can quit smoking.  Will make transplant clinic referral when he is off totally.  2. Smoking: He has cut back a lot but still smoking.  Failed both Wellbutrin and Chantix due to side effects.  3. RLE DVT: On warfarin for LVAD.  4. OSA: Continue CPAP.  5. Hyperlipidemia: Atorvastatin.  6. Type II diabetes: Per PCP.  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   - He will have long-term Keflex for MSSA suppression.  Followup with ID.  8. 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 No overt bleeding.  - Off ASA, INR goal now 1.8-2.3.  - Continue monthly octreotide injections.   Marca Ancona 11/14/2020

## 2020-11-20 ENCOUNTER — Ambulatory Visit (HOSPITAL_COMMUNITY): Payer: Self-pay | Admitting: Pharmacist

## 2020-11-20 LAB — POCT INR: INR: 2.2 (ref 2.0–3.0)

## 2020-11-20 NOTE — Progress Notes (Signed)
LVAD INR 

## 2020-11-27 ENCOUNTER — Ambulatory Visit (HOSPITAL_COMMUNITY): Payer: Self-pay | Admitting: Pharmacist

## 2020-11-27 LAB — POCT INR: INR: 2.2 (ref 2.0–3.0)

## 2020-11-27 NOTE — Progress Notes (Signed)
LVAD INR 

## 2020-11-30 IMAGING — CT CT ABD-PELV W/O CM
2 of 5 series · 14 of 46 positions shown, 16 images · non-contrast
Comparison: 01/25/2020 CT chest, abdomen and pelvis.

CLINICAL DATA: Left ventricular assist device. Chest site bleeding.

EXAM:
CT CHEST, ABDOMEN AND PELVIS WITHOUT CONTRAST
TECHNIQUE: Multidetector CT imaging of the chest, abdomen and pelvis was
performed following the standard protocol without IV contrast.

[Series 5: cap w/o 2.0 mm st · axial · non-contrast · 0.93mm/px · z∈[+555,+1157]mm · 11 of 345 slices shown, 13 images]
[im 22/345  soft-tissue]
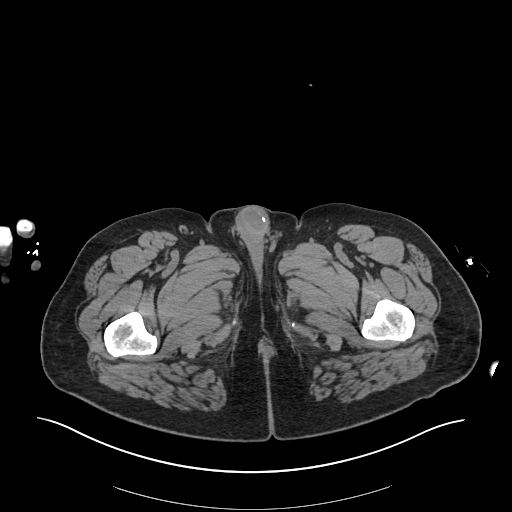
[im 22/345  bone]
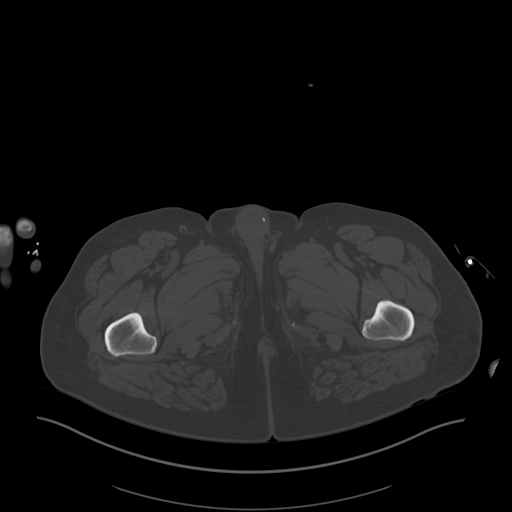
[im 65/345  soft-tissue]
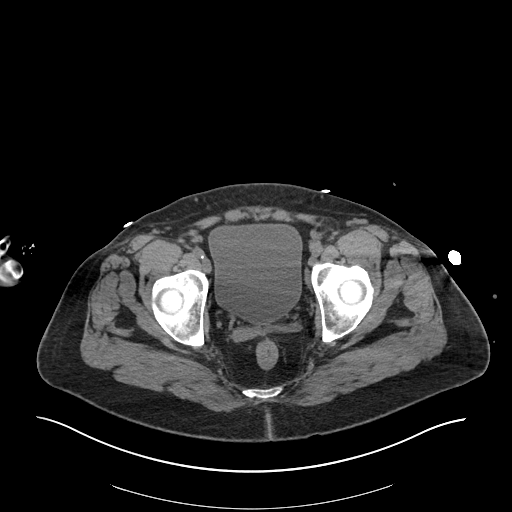
[im 87/345  soft-tissue]
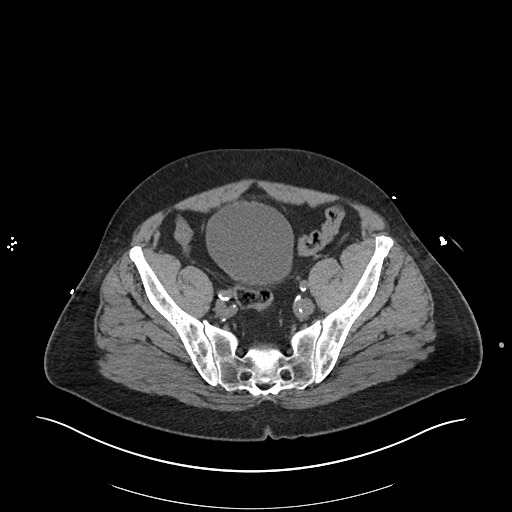
[im 108/345  soft-tissue]
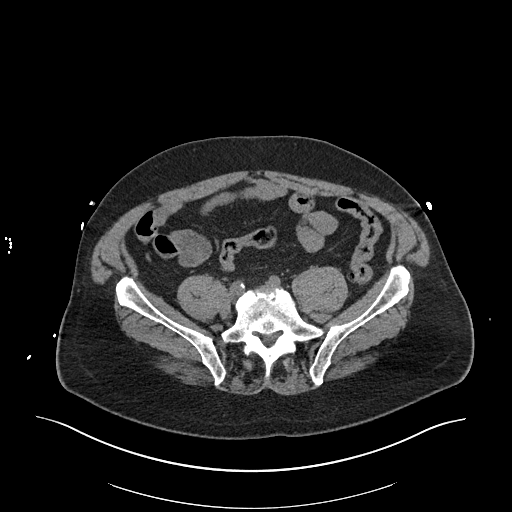
[im 151/345  soft-tissue]
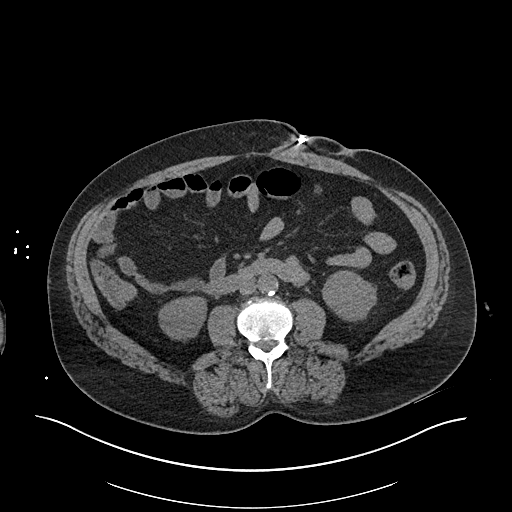
[im 173/345  soft-tissue]
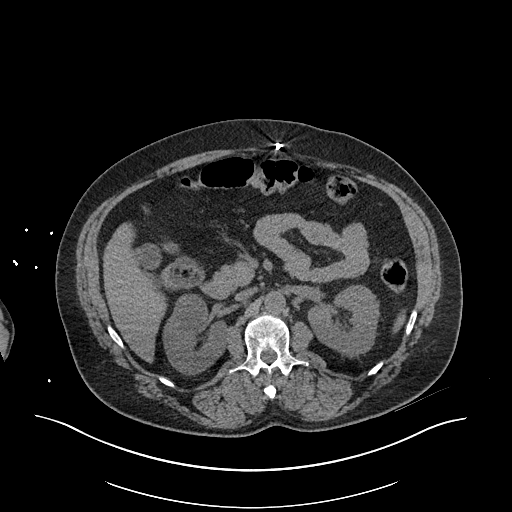
[im 194/345  soft-tissue]
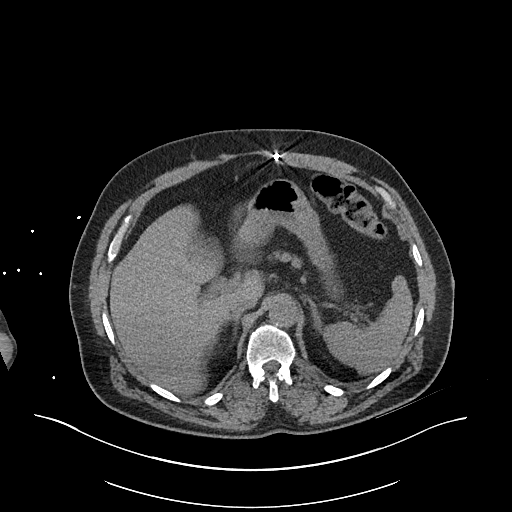
[im 237/345  soft-tissue]
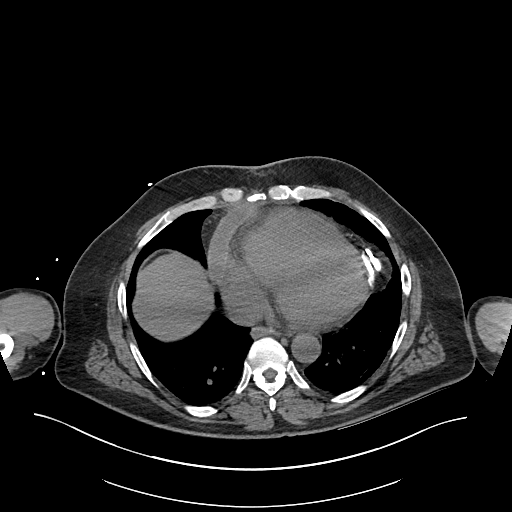
[im 259/345  soft-tissue]
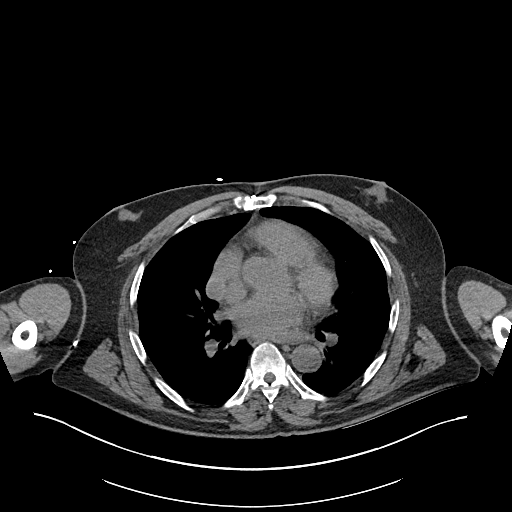
[im 259/345  bone]
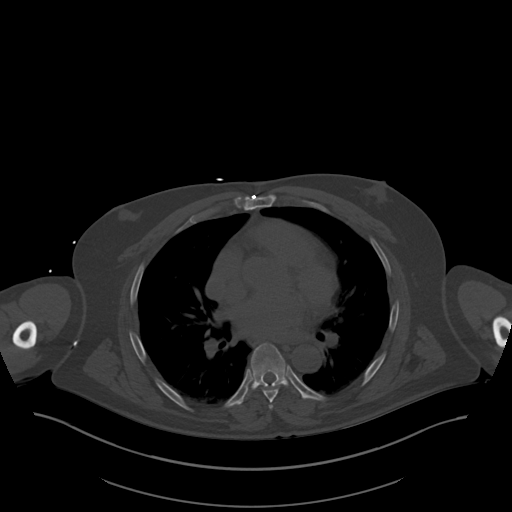
[im 280/345  soft-tissue]
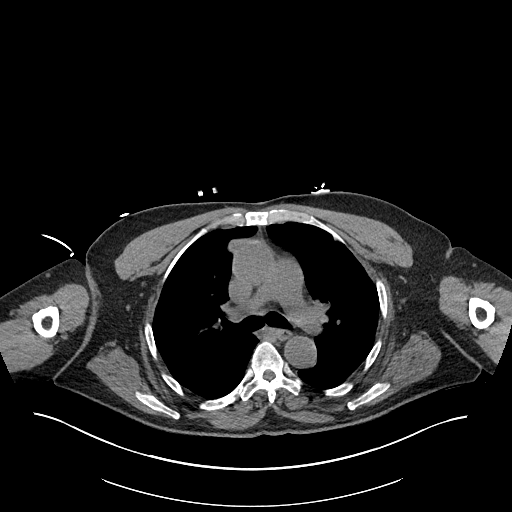
[im 323/345  soft-tissue]
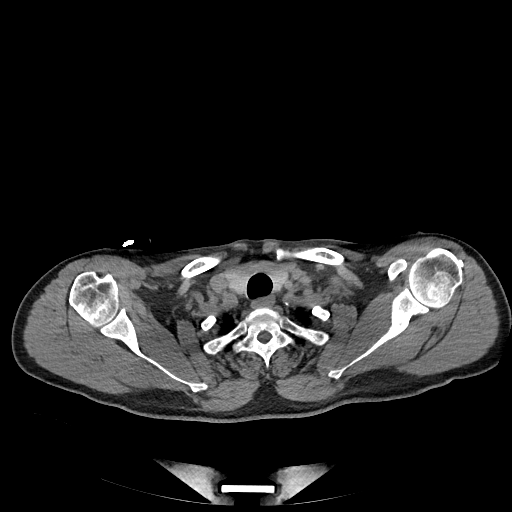

[Series 7: cap w/o 3.0 mm st cor · coronal · non-contrast · 0.86mm/px · 3 of 117 slices shown]
[im 39/117  soft-tissue]
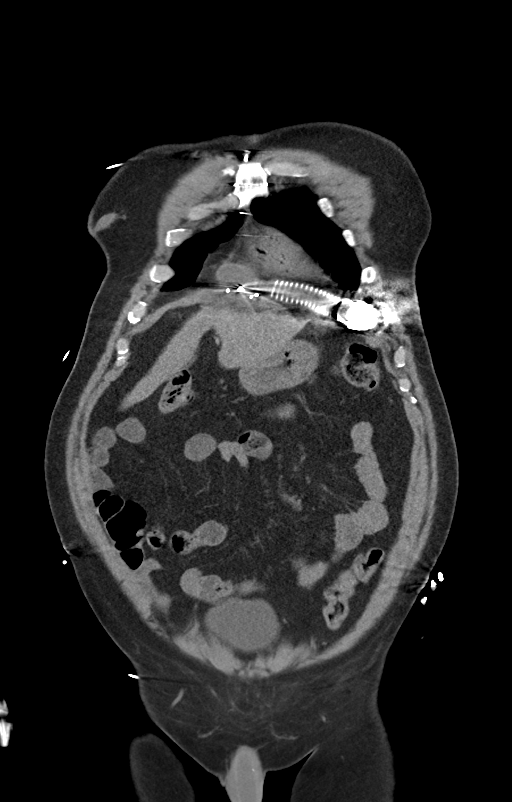
[im 52/117  soft-tissue]
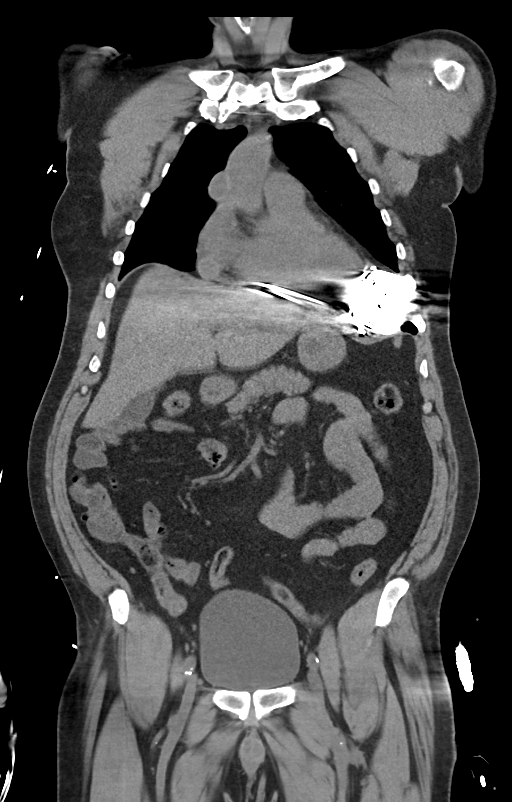
[im 65/117  soft-tissue]
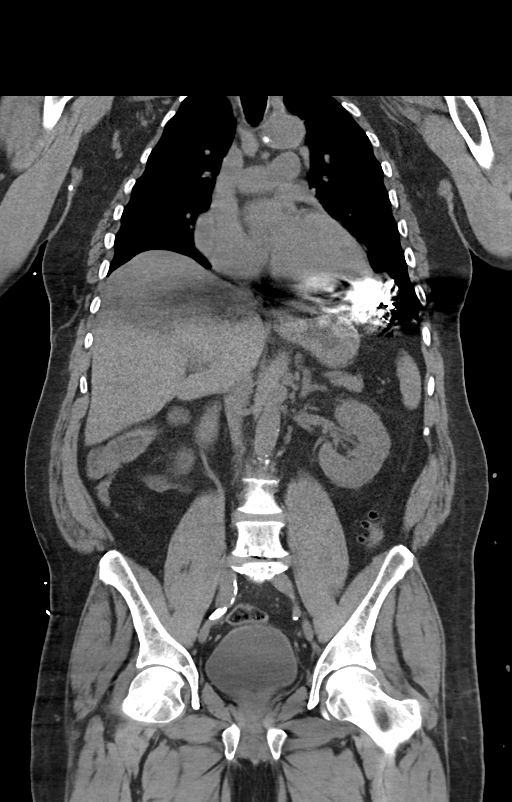

[14 of 46 positions shown; findings below may reference images not displayed]

FINDINGS: CT CHEST FINDINGS

Cardiovascular: Mild cardiomegaly. No significant pericardial
effusion/thickening. Stable position of left ventricular cyst devise
with intact appearing left abdominal driveline and outflow tract
cannula anastomosed with the ascending thoracic aorta. There is a
small amount of superficial fluid density in the midline chest wall
anterior to the xiphoid process measured 2.7 cm width (series
3/image 49), decreased from 3.5 cm on 01/25/2020 CT. A small amount
of soft tissue tracking along the proximal drive line in the
subcutaneous ventral left abdominal wall (series 3/image 78) is not
appreciably changed. No new focal fluid collections along the drive
line or along the left ventricular assist device components.
Atherosclerotic nonaneurysmal thoracic aorta. Normal caliber
pulmonary arteries.

Mediastinum/Nodes: No discrete thyroid nodules. Unremarkable
esophagus. No pathologically enlarged axillary, mediastinal or hilar
lymph nodes, noting limited sensitivity for the detection of hilar
adenopathy on this noncontrast study. No pneumomediastinum. No acute
mediastinal hematoma.

Lungs/Pleura: No pneumothorax. No pleural effusion. Mild
hypoventilatory changes in the dependent lower lobes bilaterally. No
acute consolidative airspace disease, lung masses or significant
pulmonary nodules.

Musculoskeletal: No aggressive appearing focal osseous lesions.
Intact sternotomy wires. Symmetric mild bilateral gynecomastia,
unchanged.

CT ABDOMEN PELVIS FINDINGS

Hepatobiliary: Normal liver with no liver mass. Normal gallbladder
with no radiopaque cholelithiasis. No biliary ductal dilatation.

Pancreas: Normal, with no mass or duct dilation.

Spleen: Normal size. No mass.

Adrenals/Urinary Tract: Normal adrenals. No renal stones. No
hydronephrosis. Simple 3.1 cm anterior interpolar right renal cyst.
No additional contour deforming renal lesions. Normal bladder.

Stomach/Bowel: Normal non-distended stomach. Normal caliber small
bowel with no small bowel wall thickening. Normal appendix. Mild
left colonic diverticulosis, with no large bowel wall thickening or
significant pericolonic fat stranding.

Vascular/Lymphatic: Atherosclerotic nonaneurysmal abdominal aorta.
No pathologically enlarged lymph nodes in the abdomen or pelvis.

Reproductive: Normal size prostate with nonspecific internal
prostatic calcifications.

Other: No pneumoperitoneum, ascites or focal fluid collection.

Musculoskeletal: No aggressive appearing focal osseous lesions.
Moderate lower lumbar spondylosis.
IMPRESSION: 1. No evidence of acute LVAD complication on this noncontrast scan.
Small amount of chronic superficial fluid density in the midline
chest wall anterior to the xiphoid process is decreased since
01/25/2020 CT. Stable small amount of soft tissue tracking along the
proximal drive line in the subcutaneous ventral left abdominal wall.
No new fluid collections. No acute mediastinal or intra-abdominal
hematoma.
2. Mild cardiomegaly.
3. No acute abnormality in the abdomen or pelvis.
4. Mild left colonic diverticulosis.
5. Aortic Atherosclerosis ([id number]).

## 2020-12-05 ENCOUNTER — Other Ambulatory Visit: Payer: Self-pay

## 2020-12-05 ENCOUNTER — Other Ambulatory Visit (HOSPITAL_COMMUNITY): Payer: Self-pay | Admitting: *Deleted

## 2020-12-05 ENCOUNTER — Encounter (HOSPITAL_COMMUNITY)
Admission: RE | Admit: 2020-12-05 | Discharge: 2020-12-05 | Disposition: A | Discharge status: Home / Self Care | Payer: No Typology Code available for payment source | Source: Ambulatory Visit | Attending: Cardiology | Admitting: Cardiology

## 2020-12-05 ENCOUNTER — Ambulatory Visit (HOSPITAL_COMMUNITY): Payer: Self-pay | Admitting: Pharmacist

## 2020-12-05 DIAGNOSIS — K31819 Angiodysplasia of stomach and duodenum without bleeding: Secondary | ICD-10-CM

## 2020-12-05 DIAGNOSIS — T827XXA Infection and inflammatory reaction due to other cardiac and vascular devices, implants and grafts, initial encounter: Secondary | ICD-10-CM | POA: Diagnosis present

## 2020-12-05 DIAGNOSIS — K5521 Angiodysplasia of colon with hemorrhage: Secondary | ICD-10-CM

## 2020-12-05 DIAGNOSIS — Z95811 Presence of heart assist device: Secondary | ICD-10-CM | POA: Diagnosis present

## 2020-12-05 DIAGNOSIS — Z7901 Long term (current) use of anticoagulants: Secondary | ICD-10-CM | POA: Diagnosis present

## 2020-12-05 DIAGNOSIS — I5022 Chronic systolic (congestive) heart failure: Secondary | ICD-10-CM | POA: Diagnosis present

## 2020-12-05 LAB — POCT INR: INR: 2.2 (ref 2.0–3.0)

## 2020-12-05 MED ORDER — OCTREOTIDE ACETATE 20 MG IM KIT
20.0000 mg | PACK | INTRAMUSCULAR | Status: DC
  Administered 2020-12-05: 20 mg via INTRAMUSCULAR
  Filled 2020-12-05: qty 1

## 2020-12-05 NOTE — Progress Notes (Signed)
LVAD INR 

## 2020-12-11 ENCOUNTER — Ambulatory Visit (HOSPITAL_COMMUNITY): Payer: Self-pay | Admitting: Pharmacist

## 2020-12-11 LAB — POCT INR: INR: 2.5 (ref 2.0–3.0)

## 2020-12-11 NOTE — Progress Notes (Signed)
LVAD INR 

## 2020-12-18 ENCOUNTER — Ambulatory Visit (HOSPITAL_COMMUNITY): Payer: Self-pay | Admitting: Pharmacist

## 2020-12-18 LAB — POCT INR: INR: 2.5 (ref 2.0–3.0)

## 2020-12-18 NOTE — Progress Notes (Signed)
LVAD INR 

## 2020-12-21 ENCOUNTER — Other Ambulatory Visit (HOSPITAL_COMMUNITY): Payer: Self-pay | Admitting: Unknown Physician Specialty

## 2020-12-21 DIAGNOSIS — Z95811 Presence of heart assist device: Secondary | ICD-10-CM

## 2020-12-21 DIAGNOSIS — Z7901 Long term (current) use of anticoagulants: Secondary | ICD-10-CM

## 2020-12-23 ENCOUNTER — Other Ambulatory Visit (HOSPITAL_COMMUNITY)
Admission: RE | Admit: 2020-12-23 | Discharge: 2020-12-23 | Disposition: A | Discharge status: Home / Self Care | Payer: Medicare HMO | Source: Ambulatory Visit | Attending: Cardiology | Admitting: Cardiology

## 2020-12-23 ENCOUNTER — Other Ambulatory Visit (HOSPITAL_COMMUNITY): Payer: No Typology Code available for payment source

## 2020-12-23 DIAGNOSIS — Z01812 Encounter for preprocedural laboratory examination: Secondary | ICD-10-CM | POA: Insufficient documentation

## 2020-12-23 DIAGNOSIS — Z20822 Contact with and (suspected) exposure to covid-19: Secondary | ICD-10-CM | POA: Diagnosis not present

## 2020-12-23 LAB — SARS CORONAVIRUS 2 (TAT 6-24 HRS): SARS Coronavirus 2: NEGATIVE

## 2020-12-25 ENCOUNTER — Ambulatory Visit (HOSPITAL_COMMUNITY): Payer: Self-pay | Admitting: Pharmacist

## 2020-12-25 ENCOUNTER — Ambulatory Visit (HOSPITAL_COMMUNITY)
Admission: RE | Admit: 2020-12-25 | Discharge: 2020-12-25 | Disposition: A | Discharge status: Home / Self Care | Payer: Medicare HMO | Source: Ambulatory Visit | Attending: Cardiology | Admitting: Cardiology

## 2020-12-25 ENCOUNTER — Other Ambulatory Visit: Payer: Self-pay

## 2020-12-25 DIAGNOSIS — Z7901 Long term (current) use of anticoagulants: Secondary | ICD-10-CM | POA: Diagnosis present

## 2020-12-25 DIAGNOSIS — Z95811 Presence of heart assist device: Secondary | ICD-10-CM

## 2020-12-25 DIAGNOSIS — J449 Chronic obstructive pulmonary disease, unspecified: Secondary | ICD-10-CM | POA: Insufficient documentation

## 2020-12-25 DIAGNOSIS — F17210 Nicotine dependence, cigarettes, uncomplicated: Secondary | ICD-10-CM | POA: Diagnosis not present

## 2020-12-25 LAB — PULMONARY FUNCTION TEST
DL/VA % pred: 80 %
DL/VA: 3.39 ml/min/mmHg/L
DLCO unc % pred: 51 %
DLCO unc: 14.02 ml/min/mmHg
FEF 25-75 Post: 2.3 L/sec
FEF 25-75 Pre: 1.93 L/sec
FEF2575-%Change-Post: 18 %
FEF2575-%Pred-Post: 80 %
FEF2575-%Pred-Pre: 67 %
FEV1-%Change-Post: 6 %
FEV1-%Pred-Post: 63 %
FEV1-%Pred-Pre: 59 %
FEV1-Post: 1.97 L
FEV1-Pre: 1.85 L
FEV1FVC-%Change-Post: -6 %
FEV1FVC-%Pred-Pre: 105 %
FEV6-%Change-Post: 14 %
FEV6-%Pred-Post: 66 %
FEV6-%Pred-Pre: 58 %
FEV6-Post: 2.57 L
FEV6-Pre: 2.26 L
FEV6FVC-%Pred-Post: 103 %
FEV6FVC-%Pred-Pre: 103 %
FVC-%Change-Post: 14 %
FVC-%Pred-Post: 64 %
FVC-%Pred-Pre: 56 %
FVC-Post: 2.57 L
FVC-Pre: 2.26 L
Post FEV1/FVC ratio: 76 %
Post FEV6/FVC ratio: 100 %
Pre FEV1/FVC ratio: 82 %
Pre FEV6/FVC Ratio: 100 %
RV % pred: 86 %
RV: 1.95 L
TLC % pred: 62 %
TLC: 4.37 L

## 2020-12-25 LAB — POCT INR: INR: 2.3 (ref 2.0–3.0)

## 2020-12-25 MED ORDER — ALBUTEROL SULFATE (2.5 MG/3ML) 0.083% IN NEBU
2.5000 mg | INHALATION_SOLUTION | Freq: Once | RESPIRATORY_TRACT | Status: AC
  Administered 2020-12-25: 2.5 mg via RESPIRATORY_TRACT

## 2020-12-25 NOTE — Progress Notes (Signed)
LVAD INR 

## 2021-01-01 ENCOUNTER — Ambulatory Visit (HOSPITAL_COMMUNITY): Payer: Self-pay | Admitting: Pharmacist

## 2021-01-01 LAB — POCT INR: INR: 2.2 (ref 2.0–3.0)

## 2021-01-01 NOTE — Progress Notes (Signed)
LVAD INR 

## 2021-01-02 ENCOUNTER — Encounter (HOSPITAL_COMMUNITY)
Admission: RE | Admit: 2021-01-02 | Discharge: 2021-01-02 | Disposition: A | Discharge status: Home / Self Care | Payer: No Typology Code available for payment source | Source: Ambulatory Visit | Attending: Cardiology | Admitting: Cardiology

## 2021-01-02 DIAGNOSIS — K31819 Angiodysplasia of stomach and duodenum without bleeding: Secondary | ICD-10-CM | POA: Insufficient documentation

## 2021-01-02 DIAGNOSIS — Z95811 Presence of heart assist device: Secondary | ICD-10-CM | POA: Diagnosis present

## 2021-01-02 MED ORDER — OCTREOTIDE ACETATE 20 MG IM KIT
20.0000 mg | PACK | INTRAMUSCULAR | Status: DC
  Administered 2021-01-02: 20 mg via INTRAMUSCULAR
  Filled 2021-01-02 (×2): qty 1

## 2021-01-08 ENCOUNTER — Ambulatory Visit (HOSPITAL_COMMUNITY): Payer: Self-pay | Admitting: Pharmacist

## 2021-01-08 LAB — POCT INR: INR: 2.3 (ref 2.0–3.0)

## 2021-01-08 NOTE — Progress Notes (Signed)
LVAD INR 

## 2021-01-09 NOTE — Assessment & Plan Note (Signed)
Doing well on cephalexin QID. Inflammatory markers improved significantly. Chest overlying sternal flap with expected defect - nothing to suspect active infection. Continues to have intermittent drainage around driveline; likely due to biofilms from staph aureus. Increase dressing care as directed from LVAD team if drainage increases to try to avoid local infection.  Continue cephalexin for now - hopeful to drop to TID dosing. Would like to see some of the chronic open spots heal up.  Return in about 3 months (around 01/28/2021).

## 2021-01-15 ENCOUNTER — Ambulatory Visit (HOSPITAL_COMMUNITY): Payer: Self-pay | Admitting: Pharmacist

## 2021-01-15 ENCOUNTER — Other Ambulatory Visit (HOSPITAL_COMMUNITY): Payer: Self-pay | Admitting: *Deleted

## 2021-01-15 DIAGNOSIS — Z95811 Presence of heart assist device: Secondary | ICD-10-CM

## 2021-01-15 DIAGNOSIS — Z7901 Long term (current) use of anticoagulants: Secondary | ICD-10-CM

## 2021-01-15 LAB — POCT INR: INR: 2.1 (ref 2.0–3.0)

## 2021-01-15 NOTE — Progress Notes (Signed)
LVAD INR 

## 2021-01-16 ENCOUNTER — Ambulatory Visit (HOSPITAL_COMMUNITY)
Admission: RE | Admit: 2021-01-16 | Discharge: 2021-01-16 | Disposition: A | Discharge status: Home / Self Care | Payer: Medicare HMO | Source: Ambulatory Visit | Attending: Internal Medicine | Admitting: Internal Medicine

## 2021-01-16 ENCOUNTER — Other Ambulatory Visit: Payer: Self-pay

## 2021-01-16 ENCOUNTER — Other Ambulatory Visit (HOSPITAL_COMMUNITY): Payer: Self-pay | Admitting: *Deleted

## 2021-01-16 ENCOUNTER — Encounter (HOSPITAL_COMMUNITY): Payer: Self-pay

## 2021-01-16 VITALS — BP 119/90 | HR 72 | Wt 213.4 lb

## 2021-01-16 DIAGNOSIS — E785 Hyperlipidemia, unspecified: Secondary | ICD-10-CM | POA: Diagnosis not present

## 2021-01-16 DIAGNOSIS — I82401 Acute embolism and thrombosis of unspecified deep veins of right lower extremity: Secondary | ICD-10-CM | POA: Diagnosis not present

## 2021-01-16 DIAGNOSIS — K922 Gastrointestinal hemorrhage, unspecified: Secondary | ICD-10-CM | POA: Insufficient documentation

## 2021-01-16 DIAGNOSIS — T827XXD Infection and inflammatory reaction due to other cardiac and vascular devices, implants and grafts, subsequent encounter: Secondary | ICD-10-CM | POA: Insufficient documentation

## 2021-01-16 DIAGNOSIS — E119 Type 2 diabetes mellitus without complications: Secondary | ICD-10-CM | POA: Diagnosis not present

## 2021-01-16 DIAGNOSIS — F17210 Nicotine dependence, cigarettes, uncomplicated: Secondary | ICD-10-CM | POA: Insufficient documentation

## 2021-01-16 DIAGNOSIS — J449 Chronic obstructive pulmonary disease, unspecified: Secondary | ICD-10-CM | POA: Diagnosis not present

## 2021-01-16 DIAGNOSIS — Z95811 Presence of heart assist device: Secondary | ICD-10-CM | POA: Diagnosis not present

## 2021-01-16 DIAGNOSIS — Z452 Encounter for adjustment and management of vascular access device: Secondary | ICD-10-CM | POA: Diagnosis present

## 2021-01-16 DIAGNOSIS — Z7901 Long term (current) use of anticoagulants: Secondary | ICD-10-CM | POA: Diagnosis not present

## 2021-01-16 DIAGNOSIS — Y838 Other surgical procedures as the cause of abnormal reaction of the patient, or of later complication, without mention of misadventure at the time of the procedure: Secondary | ICD-10-CM | POA: Diagnosis not present

## 2021-01-16 DIAGNOSIS — G4733 Obstructive sleep apnea (adult) (pediatric): Secondary | ICD-10-CM | POA: Diagnosis not present

## 2021-01-16 DIAGNOSIS — I5022 Chronic systolic (congestive) heart failure: Secondary | ICD-10-CM | POA: Insufficient documentation

## 2021-01-16 DIAGNOSIS — I159 Secondary hypertension, unspecified: Secondary | ICD-10-CM

## 2021-01-16 DIAGNOSIS — Z79899 Other long term (current) drug therapy: Secondary | ICD-10-CM | POA: Insufficient documentation

## 2021-01-16 LAB — COMPREHENSIVE METABOLIC PANEL
ALT: 25 U/L (ref 0–44)
AST: 20 U/L (ref 15–41)
Albumin: 3.8 g/dL (ref 3.5–5.0)
Alkaline Phosphatase: 127 U/L — ABNORMAL HIGH (ref 38–126)
Anion gap: 10 (ref 5–15)
BUN: 7 mg/dL — ABNORMAL LOW (ref 8–23)
CO2: 23 mmol/L (ref 22–32)
Calcium: 8.8 mg/dL — ABNORMAL LOW (ref 8.9–10.3)
Chloride: 106 mmol/L (ref 98–111)
Creatinine, Ser: 0.99 mg/dL (ref 0.61–1.24)
GFR, Estimated: >60 mL/min (ref >60)
Glucose, Bld: 159 mg/dL — ABNORMAL HIGH (ref 70–99)
Potassium: 3.7 mmol/L (ref 3.5–5.1)
Sodium: 139 mmol/L (ref 135–145)
Total Bilirubin: 0.6 mg/dL (ref 0.3–1.2)
Total Protein: 6.7 g/dL (ref 6.5–8.1)

## 2021-01-16 LAB — CBC
HCT: 40.2 % (ref 39.0–52.0)
Hemoglobin: 13 g/dL (ref 13.0–17.0)
MCH: 27.8 pg (ref 26.0–34.0)
MCHC: 32.3 g/dL (ref 30.0–36.0)
MCV: 86.1 fL (ref 80.0–100.0)
Platelets: 239 10*3/uL (ref 150–400)
RBC: 4.67 MIL/uL (ref 4.22–5.81)
RDW: 14.1 % (ref 11.5–15.5)
WBC: 7.4 10*3/uL (ref 4.0–10.5)
nRBC: 0 % (ref 0.0–0.2)

## 2021-01-16 LAB — SEDIMENTATION RATE: Sed Rate: 1 mm/hr (ref 0–16)

## 2021-01-16 LAB — PREALBUMIN: Prealbumin: 23.2 mg/dL (ref 18–38)

## 2021-01-16 LAB — PROTIME-INR
INR: 2.1 — ABNORMAL HIGH (ref 0.8–1.2)
Prothrombin Time: 22.4 seconds — ABNORMAL HIGH (ref 11.4–15.2)

## 2021-01-16 LAB — C-REACTIVE PROTEIN: CRP: 0.7 mg/dL (ref <1.0)

## 2021-01-16 LAB — LACTATE DEHYDROGENASE: LDH: 193 U/L — ABNORMAL HIGH (ref 98–192)

## 2021-01-16 MED ORDER — HYDRALAZINE HCL 50 MG PO TABS: 100.0000 mg | ORAL_TABLET | Freq: Three times a day (TID) | ORAL | 5 refills | Status: DC

## 2021-01-16 MED ORDER — FLUTICASONE PROPIONATE 50 MCG/ACT NA SUSP: 1.0000 | Freq: Two times a day (BID) | NASAL | 5 refills | Status: DC | PRN

## 2021-01-16 MED ORDER — GABAPENTIN 300 MG PO CAPS: 600.0000 mg | ORAL_CAPSULE | Freq: Four times a day (QID) | ORAL | 6 refills | Status: DC

## 2021-01-16 MED ORDER — PANTOPRAZOLE SODIUM 40 MG PO TBEC: 40.0000 mg | DELAYED_RELEASE_TABLET | Freq: Every day | ORAL | 3 refills | Status: DC

## 2021-01-16 MED ORDER — IPRATROPIUM-ALBUTEROL 20-100 MCG/ACT IN AERS: 1.0000 | INHALATION_SPRAY | Freq: Four times a day (QID) | RESPIRATORY_TRACT | 5 refills | Status: DC | PRN

## 2021-01-16 NOTE — Progress Notes (Addendum)
Patient presents for 2 mo f/u with 2.5 year Intermacs in VAD Clinic today with his wife. Reports no problems with VAD equipment or concerns with drive line.  Pt states that he has been feeling great lately. Denies any SOB (other than walking long distances), or blood in his stool. pts wounds are healing well. See wound note below. Reports lightheaded episodes when he bends over, but these resolve with position change.   Reports he is still smoking a few cigarettes a day. Encouraged complete cessation.   BP slightly elevated this morning. Per Dr Shirlee Latch will increase Hydralazine to 100 mg TID. Will bring back to clinic next week for BP check.   No LOW FLOWS noted on interrogation. Patient does note alarms if he rolls over on top of modular cable and "it bends in a weird way." These resolved immediately with position change. Unable to replicate in clinic.   Pt had recent CT at the [place]. CT report shown to Dr Shirlee Latch. Emphysema and slight gynecomastia noted. Pt does not have breast tenderness or lumps noted at this time. Will continue Cleda Daub today, but will continue to monitor for side effects.      Wound dressing changes as below. Remains on Keflex QID per ID. Has follow up with ID 01/30/21.   Refills sent in for Flonase, Gabapentin, Combivent, and Protonix.   Vital Signs:  Doppler Pressure: 120 Automatc BP: 119/90 (103) HR: 72 SPO2: 98% RA  Weight: 213.4 lb w/o eqt Last weight: 213 lb  VAD Indication: Destination Therapy due to smoking status  VAD interrogation & Equipment Management: Speed: 5700 Flow:  4.0 Power:  4.4w    PI: 5.4 Hct: 41  Alarms: few low voltage Events: 15 - 35 PI events daily Fixed speed: 5700 Low speed limit: 5400  Primary Controller:  Replace back up battery in 29 months. Back up controller:   Replace back up battery in 12 months.  Annual Equipment Maintenance on UBC/PM was performed on 07/2020.   I reviewed the LVAD parameters from today and compared the  results to the patient's prior recorded data. LVAD interrogation was NEGATIVE for significant power changes, NEGATIVE for clinical alarms and NEGATIVE for PI events/speed drops. No programming changes were made and pump is functioning within specified parameters. Pt is performing daily controller and system monitor self tests along with completing weekly and monthly maintenance for LVAD equipment.  LVAD equipment check completed and is in good working order. Back-up equipment present.   Abdominal incision care: Abdominal incision with gauze dressing in place packed with plain packing gauze. Dressing removed. No drainage from open areas. Cleansed with betadine. Opened area at top of area tunnels 1 cm. Bottom incision wound bed closed, 1 cm in diameter. Packed with plain packing strip and covered w/MepilexWife instructed to change weekly or prn when visibly soiled.     Exit Site Care: Drive line is being maintained twice a week by [name]. Gauze dressing with anchor intact and accurately applied. Existing dressing removed using sterile technique. Site cleansed with betadine swab x 2 and allowed to dry. Topped with dry 4 x 4. Scant amount of thick, brown/yellow drainage noted. No foul odor or tenderness. Exit site with moderate tissue ingrowth. Anchor changed. Continue twice weekly dressing changes using saline moinstened 2 x 2. Covered with gauze dressing. Anchor intact. Patient given 14 daily dressing kits, 25 anchors, sterile saline wipes, and betadine swabs.   Device: N/A   BP & Labs:  Doppler 120 - Doppler is  reflecting modified systolic   Hgb 13.0 - No S/S of bleeding. Specifically denies BRBPR.    LDH stable at 193 with established baseline of 150 - 220. Denies tea-colored urine. No power elevations noted on interrogation.   2.5 Intermacs follow up completed including:  Quality of Life, KCCQ-12, and Neurocognitive trail making.   Pt completed 1250 feet during 6 minute walk.  Back  up controller:  11V backup battery charged during this visit.  [place] Cardiomyopathy Questionnaire  KCCQ-12 01/16/2021 07/11/2020  1 a. Ability to shower/bathe Not at all limited Slightly limited  1 b. Ability to walk 1 block Slightly limited Moderately limited  1 c. Ability to hurry/jog Extremely limited Quite a bit limited  2. Edema feet/ankles/legs Never over the past 2 weeks Never over the past 2 weeks  3. Limited by fatigue At least once a day Less than once a week  4. Limited by dyspnea At least once a day Less than once a week  5. Sitting up / on 3+ pillows Never over the past 2 weeks Never over the past 2 weeks  6. Limited enjoyment of life Moderately limited Slightly limited  7. Rest of life w/ symptoms Somewhat satisfied Mostly satisfied  8 a. Participation in hobbies Severely limited Moderately limited  8 b. Participation in chores Moderately limited Slightly limited  8 c. Visiting family/friends Slightly limited Slightly limited    Patient Instructions: 1. Increase Hydralizine to 100 mg three times per day 2. Coumadin dosing per [name] PharmD 3. We will schedule you for a full echo with your next visit 4. Return to VAD clinic in 1 week for BP check 5. Return to VAD clinic in 2 months for follow up visit  Alyce Pagan RN VAD Coordinator  Office: [phone]  24/7 Pager: [phone]   60 y.o. with history of nonischemic cardiomyopathy, RLE DVT, cirrhosis, smoking, and OSA returns for followup of CHF/LVAD placement.  Cardiomyopathy was diagnosed in 6/19 in [place], [place] at that time showed low output.  He was admitted to [facility] in 9/19 with low output HF and was started on milrinone and diuresed.  Unable to wean off milrinone.  He had a degree of RV failure, but this improved on milrinone.  Valvular heart disease also looked better with milrinone and diuresis.  On 08/03/18, he had Heartmate 3 LVAD placed.  Speed was optimized by ramp echo post-op.  Post-op course was  relatively unremarkable.  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  No abscess on CT.  He was started on cefazolin IV for 6 wks.  BP-active meds decreased with low MAP.   He was admitted in 3/21 with subxiphoid abscess and cellulitis.  CT showed collection along the course of the driveline.  There was no evidence for sternal osteomyelitis.  Abscess was debrided, MSSA grew from wound cultures.  Wound vac was placed.  Patient was started on cefazolin, which was recently stopped after completing 8 wks.   Patient was re-admitted later in 3/21 with facial Zoster.  He was treated with acyclovir and this has resolved.   He was admitted in 6/21 with bleeding from the site of the subxiphoid abscess as well as cellulitis.  He required multiple units of PRBCs.  He went to the OR initially for I&D.  Blood and wound cultures grew MSSA.  He then went back to the OR for rectus flap over wound site (plastics).  He was started on cefazolin and rifabutin, has PICC.   He was admitted in 7/21 with GI bleeding, he had 6 units PRBCs.  EGD showed duodenal AVMs, treated with APC.  INR goal with warfarin lowered to 1.8-2.3.   He returns for LVAD followup.   Still smoking a couple of cigarettes/day, unable to tolerate Wellbutrin (felt like a "zombie").  Driveline site is benign, wound site in abdomen is slowly healing. No dyspnea walking on flat ground, no dyspnea with ADLs.  Can walk around [employer] without problems.  No BRBPR/melena.  MAP elevated at 103.  [facility]   Denies LVAD alarms.  Reports taking Coumadin as prescribed and adherence to anticoagulation based dietary restrictions.  Denies bright red blood per rectum or melena, no dark urine or hematuria.    Labs (9/19): LDH 210, INR 2.17, WBCs 17.1 => 15, hgb 9.7, creatinine 0.72 Labs (10/19): K 4.3, creatinine 0.78 => 0.83, hgb 10.2 Labs  (11/19): creatinine 0.79, hgb 10 Labs (2/20): K 3.9, creatinine 0.79 Labs (4/20): K 4, creatinine 1.15, hgb 11.4, LDH 199, INR 1.9 Labs (5/20): hgb 10.4, WBCs 8.5 Labs (6/20): K 4.2, creatinine 1.06, hgb 11.2 Labs (8/20): k 3.7, creatinine 1.0, hgb 11.5, LDH 186 Labs (10/20): K 3.5, creatinine 0.93, INR 2, LDH 221 Labs (12/20): hgb 15.6, K 3.7, creatinine 1.02, LDH 250 Labs (4/21): K 4.1, creatinine 0.86, hgb 11.1 Labs (6/21): K 4.1, creatinine 0.76, hgb 10, WBCs 11.2 Labs (8/21): hgb 10.6 Labs (10/21): hgb 13.2, creatinine 1.17  PMH: 1. Degenerative disc disease.  2. GERD 3. Chronic systolic CHF:  Nonischemic cardiomyopathy.  Dilated cardiomyopathy [place] 6/[place].  LHC/RHC in 7/19 showed elevated filling pressures, low cardiac output, and no significant CAD.   - Echo (9/19): Severe LV dilation with EF 10-20%, moderate-severe MR, severely dilated RV with mildly decreased systolic function, severe TR.  - Cardiac MRI (9/19): EF 14%, moderate dilated LV, severely dilated RV with mod-severe systolic function and EF 25%, nonspecific RV insertion site LGE.  - RHC (5/19) on milrinone 0.375: mean RA 8, PA 40/19, mean PCWP 12, PAPi 2.65, CI 2.71.  - Echo (9/19) on milrinone and diuresed): EF 20-25% with moderate LV dilation, moderately dilated RV with mildly decreased systolic function, mild-moderate MR, moderate TR, PASP 51 mmHg.  Cannot rule out noncompaction.  - Heartmate 3 LVAD placement in 9/19.  4. OSA: CPAP use.  5. Prior smoker 6. Type 2 diabetes 7. Hyperlipidemia.  8. H/o NSVT 9. 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  10. RLE DVT: found in 9/19.  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 Subxiphoid abscess involving collection along driveline in 3/21, MSSA.  6/21 Rectus flap to cover abscess site.  12. Facial Zoster 3/21 13. 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   Current Outpatient Medications   Medication Sig Dispense Refill   acetaminophen (TYLENOL) 500 MG tablet Take 1,000 mg by mouth every 6 (six) hours as needed for mild pain.     carbamide peroxide (DEBROX) 6.5 % OTIC solution Place 5 drops into both ears 3 (three) times daily. 15 mL 0   cephALEXin (KEFLEX) 500 MG capsule Take 1 capsule (500 mg total) by mouth 4 (four) times daily. Restart medication 06/25/20 120 capsule 5   cyanocobalamin 500 MCG tablet Take 1,000 mcg by mouth daily.      docusate sodium (COLACE) 100 MG capsule Take 2 capsules (  200 mg total) by mouth daily as needed for mild constipation. 10 capsule 0   hydrOXYzine (ATARAX/VISTARIL) 50 MG tablet Take 1 tablet (50 mg total) by mouth 3 (three) times daily as needed for itching. (Patient taking differently: Take 50 mg by mouth 2 (two) times daily as needed for itching.) 40 tablet 3   nystatin (MYCOSTATIN/NYSTOP) powder Apply 1 application topically 3 (three) times daily. 15 g 3   sacubitril-valsartan (ENTRESTO) 49-51 MG Take 1 tablet by mouth 2 (two) times daily. 60 tablet 11   sildenafil (VIAGRA) 100 MG tablet Take 0.5 tablets (50 mg total) by mouth daily as needed for erectile dysfunction. 10 tablet 6   spironolactone (ALDACTONE) 25 MG tablet Take 1 tablet (25 mg total) by mouth daily. 30 tablet 5   thiamine 100 MG tablet Take 1 tablet (100 mg total) by mouth daily. 30 tablet 0   warfarin (COUMADIN) 5 MG tablet Take 2.5 mg (1/2 tab) every Tuesday and 5 mg (1 tab) all other days or as directed by HF Clinic. 100 tablet 3   Zinc Gluconate 100 MG TABS Take 1 tablet (100 mg total) by mouth daily. 90 tablet 3   zolpidem (AMBIEN) 5 MG tablet TAKE 1 TABLET BY MOUTH EVERY DAY AT BEDTIME (Patient taking differently: Take 5 mg by mouth at bedtime.) 30 tablet 2   fluticasone (FLONASE) 50 MCG/ACT nasal spray Place 1 spray into both nostrils 2 (two) times daily as needed for allergies. 9.9 mL 5   gabapentin (NEURONTIN) 300 MG capsule Take 2 capsules (600 mg total) by  mouth 4 (four) times daily. 240 capsule 6   glucose blood (CONTOUR NEXT TEST) test strip Use as instructed (Patient not taking: No sig reported) 100 each 12   hydrALAZINE (APRESOLINE) 50 MG tablet Take 2 tablets (100 mg total) by mouth 3 (three) times daily. 90 tablet 5   Ipratropium-Albuterol (COMBIVENT) 20-100 MCG/ACT AERS respimat Inhale 1 puff into the lungs every 6 (six) hours as needed for wheezing or shortness of breath. 4 g 5   Lancets (FREESTYLE) lancets Use as instructed (Patient not taking: No sig reported) 100 each 12   pantoprazole (PROTONIX) 40 MG tablet Take 1 tablet (40 mg total) by mouth daily. Take 40 mg twice daily for 8 weeks (2 months) and then back down to 40 mg daily 90 tablet 3   No current facility-administered medications for this encounter.    Chlorhexidine and Other  REVIEW OF SYSTEMS: All systems negative except as listed in HPI, PMH and Problem list.   LVAD INTERROGATION:  Please see LVAD nurse's note above for details.    I reviewed the LVAD parameters from today, and compared the results to the patient's prior recorded data.  No programming changes were made.  The LVAD is functioning within specified parameters.  The patient performs LVAD self-test daily.  LVAD interrogation was negative for any significant power changes, alarms or PI events/speed drops.  LVAD equipment check completed and is in good working order.  Back-up equipment present.   LVAD education done on emergency procedures and precautions and reviewed exit site care.   MAP 103  Physical Exam: General: Well appearing this am. NAD.  HEENT: Normal. Neck: Supple, JVP 7-8 cm. Carotids OK.  Cardiac:  Mechanical heart sounds with LVAD hum present.  Lungs:  CTAB, normal effort.  Abdomen:  NT, ND, no HSM. No bruits or masses. +BS  LVAD exit site: Well-healed and incorporated. Dressing dry and intact. No erythema or drainage.  Stabilization device present and accurately applied. Driveline  dressing changed daily per sterile technique. Extremities:  Warm and dry. No cyanosis, clubbing, rash, or edema.  Neuro:  Alert & oriented x 3. Cranial nerves grossly intact. Moves all 4 extremities w/o difficulty. Affect pleasant      ASSESSMENT AND PLAN:   1. 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  LVAD parameters stable.  MAP remains elevated.  NYHA class II.   He does not look volume overloaded.  - He does not need Lasix.      - Continue spironolactone 25 mg daily. BMET today.  - Continue Entresto 49/51 bid.  - Increase hydralazine to 100 mg tid.  - He is off ASA with GI bleeding.  - Continue warfarin for INR 1.8-2.3.  CBC today.     - Should be transplant candidate eventually if he can quit smoking.  Will make transplant clinic referral when he is off totally.  - Repeat echo at followup appt.  2. Smoking: He has cut back a lot but still smoking.  Failed both Wellbutrin and Chantix due to side effects.  3. RLE DVT: On warfarin for LVAD.  4. OSA: Continue CPAP.  5. Hyperlipidemia: Atorvastatin.  6. Type II diabetes: Per PCP.  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   - He will have long-term Keflex for MSSA suppression.  Followup with ID.  8. 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 No overt bleeding.  - Off ASA, INR goal now 1.8-2.3.  - Continue monthly octreotide injections.   Marca Ancona 01/16/2021

## 2021-01-16 NOTE — Patient Instructions (Signed)
1. Increase Hydralizine to 100 mg three times per day 2. Coumadin dosing per [name] PharmD 3. We will schedule you for a full echo with your next visit 4. Return to VAD clinic in 1 week for BP check 5. Return to VAD clinic in 2 months for follow up visit

## 2021-01-22 ENCOUNTER — Ambulatory Visit (HOSPITAL_COMMUNITY): Payer: Self-pay | Admitting: Pharmacist

## 2021-01-22 LAB — POCT INR: INR: 2.2 (ref 2.0–3.0)

## 2021-01-22 NOTE — Progress Notes (Signed)
LVAD INR 

## 2021-01-23 ENCOUNTER — Ambulatory Visit (HOSPITAL_COMMUNITY)
Admission: RE | Admit: 2021-01-23 | Discharge: 2021-01-23 | Disposition: A | Discharge status: Home / Self Care | Payer: Medicare HMO | Source: Ambulatory Visit | Attending: Cardiology | Admitting: Cardiology

## 2021-01-23 ENCOUNTER — Other Ambulatory Visit: Payer: Self-pay

## 2021-01-23 DIAGNOSIS — G4709 Other insomnia: Secondary | ICD-10-CM

## 2021-01-23 DIAGNOSIS — I509 Heart failure, unspecified: Secondary | ICD-10-CM | POA: Diagnosis not present

## 2021-01-23 MED ORDER — ZOLPIDEM TARTRATE 5 MG PO TABS: 5.0000 mg | ORAL_TABLET | Freq: Every day | ORAL | 3 refills | Status: DC

## 2021-01-23 NOTE — Progress Notes (Signed)
Patient presents for BP check with his wife. Reports no problems with VAD equipment or concerns with drive line.  Pt states that he has been feeling great. Pt confirms that he increased his Hydralazine to 100 mg TID last week.  Vital Signs:  Automatc BP: 114/70 (92)  Patient Instructions: 1. No change in medications 2. Return to VAD clinic in 2 months for follow up visit  [name] RN VAD Coordinator  Office: [phone]  24/7 Pager: [phone]

## 2021-01-26 ENCOUNTER — Other Ambulatory Visit (HOSPITAL_COMMUNITY): Payer: Self-pay | Admitting: *Deleted

## 2021-01-26 ENCOUNTER — Other Ambulatory Visit (HOSPITAL_COMMUNITY): Payer: Self-pay

## 2021-01-26 DIAGNOSIS — E611 Iron deficiency: Secondary | ICD-10-CM

## 2021-01-29 ENCOUNTER — Telehealth: Payer: Self-pay | Admitting: Unknown Physician Specialty

## 2021-01-29 ENCOUNTER — Ambulatory Visit (INDEPENDENT_AMBULATORY_CARE_PROVIDER_SITE_OTHER): Payer: Medicare HMO | Admitting: Infectious Diseases

## 2021-01-29 ENCOUNTER — Encounter (HOSPITAL_COMMUNITY): Payer: No Typology Code available for payment source

## 2021-01-29 ENCOUNTER — Encounter: Payer: Self-pay | Admitting: Infectious Diseases

## 2021-01-29 ENCOUNTER — Other Ambulatory Visit: Payer: Self-pay

## 2021-01-29 VITALS — BP 110/76 | HR 71 | Temp 98.3°F | Wt 211.0 lb

## 2021-01-29 DIAGNOSIS — Z8619 Personal history of other infectious and parasitic diseases: Secondary | ICD-10-CM

## 2021-01-29 DIAGNOSIS — T148XXA Other injury of unspecified body region, initial encounter: Secondary | ICD-10-CM

## 2021-01-29 DIAGNOSIS — T827XXA Infection and inflammatory reaction due to other cardiac and vascular devices, implants and grafts, initial encounter: Secondary | ICD-10-CM

## 2021-01-29 DIAGNOSIS — L24A9 Irritant contact dermatitis due friction or contact with other specified body fluids: Secondary | ICD-10-CM | POA: Diagnosis not present

## 2021-01-29 NOTE — Progress Notes (Signed)
Patient: Theodore Welch  DOB: Aug 31, 1961 MRN: [id number] PCP: Reubin Milan, MD    Subjective:  CC: Hospital follow up MSSA abscess involving proximal driveline s/p repeat debridement.    ID Hx: Theodore Welch is a 61 y.o. 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    January-2020: MSSA infection of counter incision to mid abdomen; s/p serial debridements. Initially did not involve exit site at this time. Received IV Cefazolin x 4 weeks --> PO Cephalexin x 4 weeks.   [id number]: recurrent MSSA infection, now involving driveline exit site and secondary bacteremia. TEE deferred due to low sensitivity for detecting VAD endocarditis. 6 weeks IV Cefazolin s/p abd wall debridement --> cephalexin for chronic suppression for duration pump is in place.    March-2021 sudden onset subxiphoid abscess involving proximal driveline, MSSA on cultures. Blood cultures negative. S/P I&D of abdominal wall exit site and subxiphoid space. IV cefazolin x 8 weeks (May 10th 2021).  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    Apr 16, 2020 - readmitted following re-opening and bleeding of subxiphoid wound. +MSSA and taken for debridement 6/01. Required repair of outflow graft at this surgery as well (?sharp puncture vs degenerated graft d/t infection). Vertical Rectus Flap repair performed with serial debridements. BC (-). PICC Cefazolin + Rifabutin planned through 7/7   April 27, 2020 - bleeding from sternum r/t subxiphoid abscess --> debridement in OR, Cx again grew out MSSA. Back on IV cefazolin for treatment.     HPI: Theodore Welch and Theodore Welch are here for routine follow up on chronic suppression with cephalexin. Contineus to take this 4 times a day.   Says that he is doing very well. Top hole along the vertical incision has  still not healed up. Theodore Welch reports that it is still draining purulent looking material daily. She is packing with a dry iodoform strip.  No fevers, chills. This site does not hurt typically, only when packing pulled out. No erythema or swelling.     Review of Systems  Constitutional: Negative for chills and fever.  HENT: Negative for tinnitus.   Eyes: Negative for blurred vision and photophobia.  Respiratory: Negative for cough and sputum production.   Cardiovascular: Negative for chest pain.  Gastrointestinal: Negative for diarrhea, nausea and vomiting.  Genitourinary: Negative for dysuria.  Skin: Negative for rash.  Neurological: Negative for headaches.    Past Medical History:  Diagnosis Date  . 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 Cardiomyopathy, unspecified ([place])   . CHF (congestive heart failure) ([place])   . Chronic back pain   . Diabetes mellitus without complication ([place])   . Enlarged heart   . Gastric ulcer   . Gastroenteritis   . H/O degenerative disc disease   . Hypertension   . LVAD (left ventricular assist device) present [facility])     Outpatient Medications Prior to Visit  Medication Sig Dispense Refill  . acetaminophen (TYLENOL) 500 MG tablet Take 1,000 mg by mouth every 6 (six) hours as needed for mild pain.    . carbamide peroxide (DEBROX) 6.5 % OTIC solution Place 5 drops into both ears 3 (three) times daily. 15 mL 0  . cephALEXin (KEFLEX) 500 MG capsule Take 1 capsule (500 mg total) by mouth 4 (four) times daily. Restart medication 06/25/20 120 capsule 5  . cyanocobalamin  500 MCG tablet Take 1,000 mcg by mouth daily.     Marland Kitchen docusate sodium (COLACE) 100 MG capsule Take 2 capsules (200 mg total) by mouth daily as needed for mild constipation. 10 capsule 0  . fluticasone (FLONASE) 50 MCG/ACT nasal spray Place 1 spray into both nostrils 2 (two) times daily as needed for allergies. 9.9 mL 5  . gabapentin (NEURONTIN) 300 MG capsule Take 2 capsules (600 mg total) by  mouth 4 (four) times daily. 240 capsule 6  . glucose blood (CONTOUR NEXT TEST) test strip Use as instructed 100 each 12  . hydrALAZINE (APRESOLINE) 50 MG tablet Take 2 tablets (100 mg total) by mouth 3 (three) times daily. 90 tablet 5  . hydrOXYzine (ATARAX/VISTARIL) 50 MG tablet Take 1 tablet (50 mg total) by mouth 3 (three) times daily as needed for itching. (Patient taking differently: Take 50 mg by mouth 2 (two) times daily as needed for itching.) 40 tablet 3  . Ipratropium-Albuterol (COMBIVENT) 20-100 MCG/ACT AERS respimat Inhale 1 puff into the lungs every 6 (six) hours as needed for wheezing or shortness of breath. 4 g 5  . Lancets (FREESTYLE) lancets Use as instructed 100 each 12  . nystatin (MYCOSTATIN/NYSTOP) powder Apply 1 application topically 3 (three) times daily. 15 g 3  . pantoprazole (PROTONIX) 40 MG tablet Take 1 tablet (40 mg total) by mouth daily. Take 40 mg twice daily for 8 weeks (2 months) and then back down to 40 mg daily 90 tablet 3  . sacubitril-valsartan (ENTRESTO) 49-51 MG Take 1 tablet by mouth 2 (two) times daily. 60 tablet 11  . sildenafil (VIAGRA) 100 MG tablet Take 0.5 tablets (50 mg total) by mouth daily as needed for erectile dysfunction. 10 tablet 6  . spironolactone (ALDACTONE) 25 MG tablet Take 1 tablet (25 mg total) by mouth daily. 30 tablet 5  . thiamine 100 MG tablet Take 1 tablet (100 mg total) by mouth daily. 30 tablet 0  . warfarin (COUMADIN) 5 MG tablet Take 2.5 mg (1/2 tab) every Tuesday and 5 mg (1 tab) all other days or as directed by HF Clinic. 100 tablet 3  . Zinc Gluconate 100 MG TABS Take 1 tablet (100 mg total) by mouth daily. 90 tablet 3  . zolpidem (AMBIEN) 5 MG tablet Take 1 tablet (5 mg total) by mouth at bedtime. 30 tablet 3   No facility-administered medications prior to visit.     Allergies  Allergen Reactions  . Chlorhexidine Rash  . Other Rash    Prep pads    Social History   Tobacco Use  . Smoking status: Current Every Day  Smoker    Packs/day: 0.25    Types: Cigarettes    Start date: 2003  . Smokeless tobacco: Never Used  . Tobacco comment: slowed down  Vaping Use  . Vaping Use: Never used  Substance Use Topics  . Alcohol use: Not Currently  . Drug use: Not Currently     Objective:   Vitals:   01/29/21 1113  BP: 110/76  Pulse: 71  Temp: 98.3 F (36.8 C)  TempSrc: Oral  Weight: 211 lb (95.7 kg)   Body mass index is 29.43 kg/m.  Physical Exam Constitutional:      Appearance: Normal appearance. He is not ill-appearing.  HENT:     Head: Normocephalic.     Mouth/Throat:     Mouth: Mucous membranes are moist.     Pharynx: Oropharynx is clear.  Eyes:     General:  No scleral icterus. Pulmonary:     Effort: Pulmonary effort is normal.  Abdominal:    Musculoskeletal:        General: Normal range of motion.     Cervical back: Normal range of motion.  Skin:    Coloration: Skin is not jaundiced or pale.  Neurological:     Mental Status: He is alert and oriented to person, place, and time.  Psychiatric:        Mood and Affect: Mood normal.        Judgment: Judgment normal.     Lab Results: Lab Results  Component Value Date   WBC 7.4 01/16/2021   HGB 13.0 01/16/2021   HCT 40.2 01/16/2021   MCV 86.1 01/16/2021   PLT 239 01/16/2021    Lab Results  Component Value Date   CREATININE 0.99 01/16/2021   BUN 7 (L) 01/16/2021   NA 139 01/16/2021   K 3.7 01/16/2021   CL 106 01/16/2021   CO2 23 01/16/2021    Sed Rate (mm/hr)  Date Value  01/16/2021 1  09/12/2020 2  08/15/2020 4   CRP  Date Value  01/16/2021 0.7 mg/dL  08/15/2020 1.3 mg/dL (H)  05/23/2020 12.6 mg/L (H)     Assessment & Plan:   Problem List Items Addressed This Visit      High   Infection associated with driveline of left ventricular assist device (LVAD) ([place]) - Primary    Stable chronic infection on suppressive antibiotics. Will decrease to TID cephalexin. CRP / ESR normalized.   Sed Rate (mm/hr)   Date Value  01/16/2021 1  09/12/2020 2  08/15/2020 4   CRP  Date Value  01/16/2021 0.7 mg/dL  08/15/2020 1.3 mg/dL (H)  05/23/2020 12.6 mg/L (H)   Follow up in 4 months.         Unprioritized   Drainage from wound    Onur experienced wound dehiscence in the past from previous muscle flap closure over chronic sternal infection. The sternal site looks great today, however he has a small 1 cm annular wound that is not healed along the incision. There is more frequent white thick draiange from the lower portion of the wound in the 6 o'clock position. Unclear if this may be infected or sterile. I cleansed the area and then expressed new thick drainage for culture. He has in the past cultured out corynebacterium which we treated with 7d linezolid. May need another short course to see if this helps. Given the epibole I suspect that if this is to have a chance to heal it will need some chemical or mechanical disruption to re-stimulate tissue ingrowth. Not sure he is crazy to pursue that at this time.   I asked Theodore Welch to continue packing it but to moisten the gauze with sterile saline for more of a wet to dry technique to achieve better removal of debris.  Could consider re-consult with plastics if he wanted to pursue.       Relevant Orders   Wound culture      Janene Madeira, MSN, NP-C [facility] for [email or web address] Pager: [phone] Office: [phone] [place]: [phone]

## 2021-01-29 NOTE — Telephone Encounter (Signed)
Received auth from [facility] for DME for VAD supplies [phone]:   Auth # [id number] for 04/10/20-04/09/21   Auth# [id number] for 04/10/21-01/10/22   [name] RN, BSN VAD Coordinator 24/7 Pager [phone]

## 2021-01-29 NOTE — Patient Instructions (Addendum)
Decrease your keflex to three times a day.  For the wound - I would use saline dampened   Will see you back in 3 - 4 months.

## 2021-01-29 NOTE — Assessment & Plan Note (Signed)
Stable chronic infection on suppressive antibiotics. Will decrease to TID cephalexin. CRP / ESR normalized.   Sed Rate (mm/hr)  Date Value  01/16/2021 1  09/12/2020 2  08/15/2020 4   CRP  Date Value  01/16/2021 0.7 mg/dL  08/15/2020 1.3 mg/dL (H)  05/23/2020 12.6 mg/L (H)   Follow up in 4 months.

## 2021-01-29 NOTE — Assessment & Plan Note (Signed)
Theodore Welch experienced wound dehiscence in the past from previous muscle flap closure over chronic sternal infection. The sternal site looks great today, however he has a small 1 cm annular wound that is not healed along the incision. There is more frequent white thick draiange from the lower portion of the wound in the 6 o'clock position. Unclear if this may be infected or sterile. I cleansed the area and then expressed new thick drainage for culture. He has in the past cultured out corynebacterium which we treated with 7d linezolid. May need another short course to see if this helps. Given the epibole I suspect that if this is to have a chance to heal it will need some chemical or mechanical disruption to re-stimulate tissue ingrowth. Not sure he is crazy to pursue that at this time.   I asked [name] to continue packing it but to moisten the gauze with sterile saline for more of a wet to dry technique to achieve better removal of debris.  Could consider re-consult with plastics if he wanted to pursue.

## 2021-01-30 ENCOUNTER — Ambulatory Visit (HOSPITAL_COMMUNITY): Payer: Self-pay | Admitting: Pharmacist

## 2021-01-30 ENCOUNTER — Encounter (HOSPITAL_COMMUNITY)
Admission: RE | Admit: 2021-01-30 | Discharge: 2021-01-30 | Disposition: A | Discharge status: Home / Self Care | Payer: Medicare HMO | Source: Ambulatory Visit | Attending: Cardiology | Admitting: Cardiology

## 2021-01-30 DIAGNOSIS — E611 Iron deficiency: Secondary | ICD-10-CM | POA: Diagnosis not present

## 2021-01-30 LAB — POCT INR: INR: 2.5 (ref 2.0–3.0)

## 2021-01-30 MED ORDER — OCTREOTIDE ACETATE 20 MG IM KIT
20.0000 mg | PACK | INTRAMUSCULAR | Status: DC
  Administered 2021-01-30: 20 mg via INTRAMUSCULAR
  Filled 2021-01-30 (×2): qty 1

## 2021-01-30 NOTE — Progress Notes (Signed)
LVAD INR 

## 2021-01-31 MED ORDER — CIPROFLOXACIN HCL 500 MG PO TABS: 500.0000 mg | ORAL_TABLET | Freq: Two times a day (BID) | ORAL | 0 refills | Status: DC

## 2021-01-31 NOTE — Addendum Note (Signed)
Addended by: Blanchard Kelch on: 01/31/2021 01:49 PM   Modules accepted: Orders

## 2021-01-31 NOTE — Progress Notes (Signed)
Small non-healing surgical wound along right abdominal scar (not VAD DL) growing out enterobacter cloacae - always resistant to amp/1st gen cephalosporins.   Likely only easily attainable options bactrim vs cipro - Will start cipro 500 mg BID x 7d. Check INR at home Saturday and early next week (he usually checks Monday) with POC strip given his propensity to bleed.   Will forward to [facility] VAD team and warfarin pharmacist for help to follow closely if there is any anticipated warfarin dose changes they would recommend.   Will send to plastics given h/o muscle flap if no improvement with increased dressing changes and antibiotic.  He will need to continue cephalexin TID as previously directed.

## 2021-02-01 ENCOUNTER — Other Ambulatory Visit: Payer: Self-pay | Admitting: Medical

## 2021-02-01 LAB — WOUND CULTURE
MICRO NUMBER:: 11645422
SPECIMEN QUALITY:: ADEQUATE

## 2021-02-02 ENCOUNTER — Ambulatory Visit (HOSPITAL_COMMUNITY): Payer: Self-pay | Admitting: Pharmacist

## 2021-02-02 LAB — POCT INR: INR: 1.8 — AB (ref 2.0–3.0)

## 2021-02-02 NOTE — Progress Notes (Signed)
LVAD INR 

## 2021-02-05 ENCOUNTER — Other Ambulatory Visit: Payer: Self-pay | Admitting: Infectious Diseases

## 2021-02-05 MED ORDER — CEPHALEXIN 500 MG PO CAPS: 500.0000 mg | ORAL_CAPSULE | Freq: Three times a day (TID) | ORAL | 11 refills | Status: DC

## 2021-02-05 NOTE — Telephone Encounter (Signed)
Spoke with patient who states his drainage has decreased since starting Cipro. Would like refills for Cephalexin sent to [facility].

## 2021-02-05 NOTE — Telephone Encounter (Signed)
Please advise on refill.

## 2021-02-05 NOTE — Telephone Encounter (Signed)
Please give him a call to see how the drainage has been from his wound since using this antibiotic.

## 2021-02-05 NOTE — Telephone Encounter (Signed)
With improvement in drainage, let's do one more week of the cipro 500 mg BID and continue the cephalixin 500 mg TID.   Can you have him schedule a visit with me in 2 weeks to follow up on this wound drainage? I want to make sure we don't need to send him to see plastics.   I will send in to his pharmacy for both prescriptions CC: his coumadin team to help let them know re: cipro extension.

## 2021-02-06 ENCOUNTER — Other Ambulatory Visit (HOSPITAL_COMMUNITY): Payer: Self-pay | Admitting: *Deleted

## 2021-02-06 DIAGNOSIS — Z7901 Long term (current) use of anticoagulants: Secondary | ICD-10-CM

## 2021-02-06 DIAGNOSIS — Z95811 Presence of heart assist device: Secondary | ICD-10-CM

## 2021-02-06 DIAGNOSIS — E611 Iron deficiency: Secondary | ICD-10-CM

## 2021-02-07 ENCOUNTER — Ambulatory Visit (HOSPITAL_COMMUNITY): Payer: Self-pay | Admitting: Pharmacist

## 2021-02-07 ENCOUNTER — Ambulatory Visit (HOSPITAL_COMMUNITY)
Admission: RE | Admit: 2021-02-07 | Discharge: 2021-02-07 | Disposition: A | Discharge status: Home / Self Care | Payer: Medicare HMO | Source: Ambulatory Visit | Attending: Internal Medicine | Admitting: Internal Medicine

## 2021-02-07 ENCOUNTER — Other Ambulatory Visit: Payer: Self-pay

## 2021-02-07 DIAGNOSIS — Z7901 Long term (current) use of anticoagulants: Secondary | ICD-10-CM | POA: Diagnosis present

## 2021-02-07 DIAGNOSIS — Z95811 Presence of heart assist device: Secondary | ICD-10-CM

## 2021-02-07 LAB — PROTIME-INR
INR: 1.8 — ABNORMAL HIGH (ref 0.8–1.2)
Prothrombin Time: 20.7 seconds — ABNORMAL HIGH (ref 11.4–15.2)

## 2021-02-07 NOTE — Progress Notes (Signed)
LVAD INR 

## 2021-02-12 ENCOUNTER — Other Ambulatory Visit (HOSPITAL_COMMUNITY): Payer: Self-pay | Admitting: *Deleted

## 2021-02-12 DIAGNOSIS — Z7901 Long term (current) use of anticoagulants: Secondary | ICD-10-CM

## 2021-02-12 DIAGNOSIS — Z95811 Presence of heart assist device: Secondary | ICD-10-CM

## 2021-02-15 ENCOUNTER — Ambulatory Visit (HOSPITAL_COMMUNITY): Payer: Self-pay | Admitting: Pharmacist

## 2021-02-15 ENCOUNTER — Other Ambulatory Visit: Payer: Self-pay

## 2021-02-15 ENCOUNTER — Ambulatory Visit (HOSPITAL_COMMUNITY)
Admission: RE | Admit: 2021-02-15 | Discharge: 2021-02-15 | Disposition: A | Discharge status: Home / Self Care | Payer: Medicare HMO | Source: Ambulatory Visit | Attending: Internal Medicine | Admitting: Internal Medicine

## 2021-02-15 DIAGNOSIS — Z7901 Long term (current) use of anticoagulants: Secondary | ICD-10-CM | POA: Insufficient documentation

## 2021-02-15 DIAGNOSIS — Z95811 Presence of heart assist device: Secondary | ICD-10-CM

## 2021-02-15 LAB — PROTIME-INR
INR: 1.5 — ABNORMAL HIGH (ref 0.8–1.2)
Prothrombin Time: 17.5 seconds — ABNORMAL HIGH (ref 11.4–15.2)

## 2021-02-15 NOTE — Progress Notes (Signed)
LVAD INR 

## 2021-02-16 NOTE — Progress Notes (Signed)
Patient: Theodore Welch  DOB: [birth year] MRN: [id number] PCP: [name], MD    Subjective:  CC: FU non-healing wound along surgical incision.  LVAD infection    ID Hx: Theodore Welch is a 59 y.o. 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    January-2020: MSSA infection of counter incision to mid abdomen; s/p serial debridements. Initially did not involve exit site at this time. Received IV Cefazolin x 4 weeks --> PO Cephalexin x 4 weeks.   May-2020: recurrent MSSA infection, now involving driveline exit site and secondary bacteremia. TEE deferred due to low sensitivity for detecting VAD endocarditis. 6 weeks IV Cefazolin s/p abd wall debridement --> cephalexin for chronic suppression for duration pump is in place.    March-2021 sudden onset subxiphoid abscess involving proximal driveline, MSSA on cultures. Blood cultures negative. S/P I&D of abdominal wall exit site and subxiphoid space. IV cefazolin x 8 weeks (May 10th 2021).  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    Apr 16, 2020 - readmitted following re-opening and bleeding of subxiphoid wound. +MSSA and taken for debridement 6/01. Required repair of outflow graft at this surgery as well (?sharp puncture vs degenerated graft d/t infection). Vertical Rectus Flap repair performed with serial debridements. BC (-). PICC Cefazolin + Rifabutin planned through 7/7   April 27, 2020 - bleeding from sternum r/t subxiphoid abscess --> debridement in OR, Cx again grew out MSSA. Back on IV cefazolin for treatment.    March 2022 - enterobacter cloacae growing from non-healing soft tissue wound.     HPI: Theodore Welch and Theodore Welch are here for routine follow up after we treated increased drainage from chronic non-healing wound along abdominal incision. The  drainage from the abdominal incision has essentially stopped. Very little on the packing now if anything. No purulent material is able to be milked out of the site.   No problems with bleeding on Cipro - worked closely with VAD coumadin pharmacist. Had some constipation that was relieved from OTC stool softener.  Also noticed perianal itching without rash. He has relief from hydroxyzine.     Review of Systems  Constitutional: Negative for chills and fever.  HENT: Negative for tinnitus.   Eyes: Negative for blurred vision and photophobia.  Respiratory: Negative for cough and sputum production.   Cardiovascular: Negative for chest pain.  Gastrointestinal: Negative for diarrhea, nausea and vomiting.  Genitourinary: Negative for dysuria.  Musculoskeletal: Negative for back pain and joint pain.  Skin: Positive for itching. Negative for rash.  Neurological: Negative for headaches.    Past Medical History:  Diagnosis Date  . 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 Cardiomyopathy, unspecified ([facility])   . CHF (congestive heart failure) ([facility])   . Chronic back pain   . Diabetes mellitus without complication ([facility])   . Enlarged heart   . Gastric ulcer   . Gastroenteritis   . H/O degenerative disc disease   . Hypertension   . LVAD (left ventricular assist device) present [facility])     Outpatient Medications Prior to Visit  Medication Sig Dispense Refill  . acetaminophen (TYLENOL) 500 MG tablet Take 1,000 mg by mouth every 6 (six) hours as needed for mild pain.    . carbamide peroxide (DEBROX) 6.5 % OTIC solution Place 5 drops into both ears 3 (three) times daily.  15 mL 0  . cephALEXin (KEFLEX) 500 MG capsule Take 1 capsule (500 mg total) by mouth 3 (three) times daily. 90 capsule 11  . ciprofloxacin (CIPRO) 500 MG tablet Take 1 tablet by mouth twice daily for 7 days 14 tablet 0  . cyanocobalamin 500 MCG tablet Take 1,000 mcg by mouth daily.     Marland Kitchen docusate sodium (COLACE) 100 MG capsule Take 2  capsules (200 mg total) by mouth daily as needed for mild constipation. 10 capsule 0  . fluticasone (FLONASE) 50 MCG/ACT nasal spray Place 1 spray into both nostrils 2 (two) times daily as needed for allergies. 9.9 mL 5  . glucose blood (CONTOUR NEXT TEST) test strip Use as instructed 100 each 12  . hydrALAZINE (APRESOLINE) 50 MG tablet Take 2 tablets (100 mg total) by mouth 3 (three) times daily. 90 tablet 5  . hydrOXYzine (ATARAX/VISTARIL) 50 MG tablet Take 1 tablet (50 mg total) by mouth 3 (three) times daily as needed for itching. (Patient taking differently: Take 50 mg by mouth 2 (two) times daily as needed for itching.) 40 tablet 3  . Ipratropium-Albuterol (COMBIVENT) 20-100 MCG/ACT AERS respimat Inhale 1 puff into the lungs every 6 (six) hours as needed for wheezing or shortness of breath. 4 g 5  . Lancets (FREESTYLE) lancets Use as instructed 100 each 12  . nystatin (MYCOSTATIN/NYSTOP) powder Apply 1 application topically 3 (three) times daily. 15 g 3  . pantoprazole (PROTONIX) 40 MG tablet Take 1 tablet (40 mg total) by mouth daily. Take 40 mg twice daily for 8 weeks (2 months) and then back down to 40 mg daily 90 tablet 3  . sacubitril-valsartan (ENTRESTO) 49-51 MG Take 1 tablet by mouth 2 (two) times daily. 60 tablet 11  . sildenafil (VIAGRA) 100 MG tablet Take 0.5 tablets (50 mg total) by mouth daily as needed for erectile dysfunction. 10 tablet 6  . spironolactone (ALDACTONE) 25 MG tablet Take 1 tablet (25 mg total) by mouth daily. 30 tablet 5  . thiamine 100 MG tablet Take 1 tablet (100 mg total) by mouth daily. 30 tablet 0  . warfarin (COUMADIN) 5 MG tablet Take 2.5 mg (1/2 tab) every Tuesday and 5 mg (1 tab) all other days or as directed by HF Clinic. 100 tablet 3  . Zinc Gluconate 100 MG TABS Take 1 tablet (100 mg total) by mouth daily. 90 tablet 3  . zolpidem (AMBIEN) 5 MG tablet Take 1 tablet (5 mg total) by mouth at bedtime. 30 tablet 3  . gabapentin (NEURONTIN) 300 MG capsule  Take 2 capsules (600 mg total) by mouth 4 (four) times daily. 240 capsule 6   No facility-administered medications prior to visit.     Allergies  Allergen Reactions  . Chlorhexidine Rash  . Other Rash    Prep pads    Social History   Tobacco Use  . Smoking status: Current Every Day Smoker    Packs/day: 0.25    Types: Cigarettes    Start date: 2003  . Smokeless tobacco: Never Used  . Tobacco comment: slowed down  Vaping Use  . Vaping Use: Never used  Substance Use Topics  . Alcohol use: Not Currently  . Drug use: Not Currently     Objective:   Vitals:   02/19/21 0928  BP: 119/84  Pulse: 71  Temp: 98.1 F (36.7 C)  TempSrc: Oral  Weight: 210 lb (95.3 kg)   Body mass index is 29.29 kg/m.   Physical Exam Constitutional:  Appearance: Normal appearance. He is not ill-appearing.  HENT:     Head: Normocephalic.     Mouth/Throat:     Mouth: Mucous membranes are moist.     Pharynx: Oropharynx is clear.  Eyes:     General: No scleral icterus. Pulmonary:     Effort: Pulmonary effort is normal.  Abdominal:    Musculoskeletal:        General: Normal range of motion.     Cervical back: Normal range of motion.  Skin:    Coloration: Skin is not jaundiced or pale.  Neurological:     Mental Status: He is alert and oriented to person, place, and time.  Psychiatric:        Mood and Affect: Mood normal.        Judgment: Judgment normal.          Lab Results: Lab Results  Component Value Date   WBC 7.4 01/16/2021   HGB 13.0 01/16/2021   HCT 40.2 01/16/2021   MCV 86.1 01/16/2021   PLT 239 01/16/2021    Lab Results  Component Value Date   CREATININE 0.99 01/16/2021   BUN 7 (L) 01/16/2021   NA 139 01/16/2021   K 3.7 01/16/2021   CL 106 01/16/2021   CO2 23 01/16/2021    Sed Rate (mm/hr)  Date Value  01/16/2021 1  09/12/2020 2  08/15/2020 4   CRP  Date Value  01/16/2021 0.7 mg/dL  69/67/8938 1.3 mg/dL (H)  09/03/5101 58.5 mg/L (H)      Assessment & Plan:   Problem List Items Addressed This Visit      High   Infection associated with driveline of left ventricular assist device (LVAD) ([facility]) - Primary    Continue Cephalexin 500 mg TID. No findings concerning for relapsing infection. There is a new area of what appears to be hypergranulation tissue noted on what looks to be the 2-3 o'clock position on the exit site. They are using saline dampened gauze 2x weekly to change. Encouraged them to stick with dry dressings only to help decrease moisture exposure, which is in line with VAD clinic recommendations as well. They will try this approach and see how it looks over the next few weeks.  He will always likely have chronic drainage to some degree from the external site at the area of tunneling d/t likely biofilm formation around line and inability of tissue to re-incorporate and heal.   FU in 3 months.         Unprioritized   GIB (gastrointestinal bleeding)    No problems noted on Cipro with proactive dose adjustments from VAD team pharmacist. Appreciate her help!  Lab Results  Component Value Date   INR 1.5 (H) 02/15/2021   INR 1.8 (H) 02/07/2021   INR 1.8 (A) 02/02/2021         Drainage from wound    Cultures grew out enterobacter cloacea - treated with 2 weeks cipro. Wound drainage has resolved and appears to have healed over from the best I can see. Still with two chronic "pot holes" along incision and will require monitoring and cleaning to prevent infections here. I don't think we need to refer back to plastics at this time given improvement.  If they find no drainage on the packing material he can just cover with allevyn foam as he has been doing to keep clean. I asked them to call to report any drainage between visits.  Rexene Alberts, MSN, NP-C [facility] for Infectious Disease [facility]  [email or web address] Pager: [phone] Office: [phone] [facility] Main  Line: [phone]

## 2021-02-19 ENCOUNTER — Encounter: Payer: Self-pay | Admitting: Infectious Diseases

## 2021-02-19 ENCOUNTER — Other Ambulatory Visit: Payer: Self-pay

## 2021-02-19 ENCOUNTER — Ambulatory Visit (INDEPENDENT_AMBULATORY_CARE_PROVIDER_SITE_OTHER): Payer: Medicare HMO | Admitting: Infectious Diseases

## 2021-02-19 VITALS — BP 119/84 | HR 71 | Temp 98.1°F | Wt 210.0 lb

## 2021-02-19 DIAGNOSIS — T827XXD Infection and inflammatory reaction due to other cardiac and vascular devices, implants and grafts, subsequent encounter: Secondary | ICD-10-CM

## 2021-02-19 DIAGNOSIS — Z8619 Personal history of other infectious and parasitic diseases: Secondary | ICD-10-CM

## 2021-02-19 DIAGNOSIS — T8130XA Disruption of wound, unspecified, initial encounter: Secondary | ICD-10-CM

## 2021-02-19 DIAGNOSIS — L24A9 Irritant contact dermatitis due friction or contact with other specified body fluids: Secondary | ICD-10-CM | POA: Diagnosis not present

## 2021-02-19 DIAGNOSIS — T827XXA Infection and inflammatory reaction due to other cardiac and vascular devices, implants and grafts, initial encounter: Secondary | ICD-10-CM

## 2021-02-19 DIAGNOSIS — K31811 Angiodysplasia of stomach and duodenum with bleeding: Secondary | ICD-10-CM | POA: Diagnosis not present

## 2021-02-19 NOTE — Assessment & Plan Note (Signed)
No problems noted on Cipro with proactive dose adjustments from VAD team pharmacist. Appreciate her help!  Lab Results  Component Value Date   INR 1.5 (H) 02/15/2021   INR 1.8 (H) 02/07/2021   INR 1.8 (A) 02/02/2021

## 2021-02-19 NOTE — Patient Instructions (Signed)
Happy Early [place] Ouch!  Just continue the cephalexin now three times a day like before.   Will see you back in 3 months to check in.   Please call if you have any changes in your wound from either site that we need to see you earlier for.   Witch Hazel pads may be helpful for your itching. Preparation H cream may also help the itching too.   For the driveline - try just dry dressings after you clean it. I worry that new area may be due to the site being too wet often. Will let the VAD team know also.

## 2021-02-19 NOTE — Assessment & Plan Note (Addendum)
Continue Cephalexin 500 mg TID. No findings concerning for relapsing infection. There is a new area of what appears to be hypergranulation tissue noted on what looks to be the 2-3 o'clock position on the exit site. They are using saline dampened gauze 2x weekly to change. Encouraged them to stick with dry dressings only to help decrease moisture exposure, which is in line with VAD clinic recommendations as well. They will try this approach and see how it looks over the next few weeks.  He will always likely have chronic drainage to some degree from the external site at the area of tunneling d/t likely biofilm formation around line and inability of tissue to re-incorporate and heal.   FU in 3 months.

## 2021-02-19 NOTE — Assessment & Plan Note (Addendum)
Cultures grew out enterobacter cloacea - treated with 2 weeks cipro. Wound drainage has resolved and appears to have healed over from the best I can see. Still with two chronic "pot holes" along incision and will require monitoring and cleaning to prevent infections here. I don't think we need to refer back to plastics at this time given improvement.  If they find no drainage on the packing material he can just cover with allevyn foam as he has been doing to keep clean. I asked them to call to report any drainage between visits.

## 2021-02-21 ENCOUNTER — Other Ambulatory Visit (HOSPITAL_COMMUNITY): Payer: Self-pay | Admitting: Cardiology

## 2021-02-21 DIAGNOSIS — Z95811 Presence of heart assist device: Secondary | ICD-10-CM

## 2021-02-21 DIAGNOSIS — I159 Secondary hypertension, unspecified: Secondary | ICD-10-CM

## 2021-02-22 ENCOUNTER — Ambulatory Visit: Payer: No Typology Code available for payment source | Admitting: Infectious Diseases

## 2021-02-26 ENCOUNTER — Encounter (HOSPITAL_COMMUNITY)
Admission: RE | Admit: 2021-02-26 | Discharge: 2021-02-26 | Disposition: A | Discharge status: Home / Self Care | Payer: Medicare HMO | Source: Ambulatory Visit | Attending: Cardiology | Admitting: Cardiology

## 2021-02-26 ENCOUNTER — Ambulatory Visit (HOSPITAL_COMMUNITY): Payer: Self-pay | Admitting: Pharmacist

## 2021-02-26 DIAGNOSIS — K31819 Angiodysplasia of stomach and duodenum without bleeding: Secondary | ICD-10-CM | POA: Diagnosis present

## 2021-02-26 DIAGNOSIS — Z95811 Presence of heart assist device: Secondary | ICD-10-CM | POA: Diagnosis present

## 2021-02-26 LAB — POCT INR: INR: 2.1 (ref 2.0–3.0)

## 2021-02-26 MED ORDER — OCTREOTIDE ACETATE 20 MG IM KIT
20.0000 mg | PACK | INTRAMUSCULAR | Status: DC
Start: 2021-02-26 — End: 2021-02-27
  Administered 2021-02-26: 20 mg via INTRAMUSCULAR
  Filled 2021-02-26 (×2): qty 1

## 2021-02-26 NOTE — Progress Notes (Signed)
LVAD INR 

## 2021-02-27 ENCOUNTER — Encounter (HOSPITAL_COMMUNITY): Payer: No Typology Code available for payment source

## 2021-02-28 ENCOUNTER — Other Ambulatory Visit (HOSPITAL_COMMUNITY): Payer: Self-pay | Admitting: Cardiology

## 2021-03-05 ENCOUNTER — Other Ambulatory Visit (HOSPITAL_COMMUNITY): Payer: Self-pay | Admitting: Cardiology

## 2021-03-05 ENCOUNTER — Ambulatory Visit (HOSPITAL_COMMUNITY): Payer: Self-pay | Admitting: Pharmacist

## 2021-03-05 LAB — POCT INR: INR: 2.3 (ref 2.0–3.0)

## 2021-03-05 NOTE — Progress Notes (Signed)
LVAD INR 

## 2021-03-13 ENCOUNTER — Ambulatory Visit (HOSPITAL_COMMUNITY): Payer: Self-pay | Admitting: Pharmacist

## 2021-03-13 LAB — POCT INR: INR: 2 (ref 2.0–3.0)

## 2021-03-13 NOTE — Progress Notes (Signed)
LVAD INR 

## 2021-03-19 ENCOUNTER — Ambulatory Visit (HOSPITAL_COMMUNITY): Payer: Self-pay | Admitting: Pharmacist

## 2021-03-19 LAB — POCT INR: INR: 2.5 (ref 2.0–3.0)

## 2021-03-19 NOTE — Progress Notes (Signed)
LVAD INR 

## 2021-03-26 ENCOUNTER — Other Ambulatory Visit (HOSPITAL_COMMUNITY): Payer: Self-pay | Admitting: *Deleted

## 2021-03-26 ENCOUNTER — Ambulatory Visit (HOSPITAL_COMMUNITY): Payer: Self-pay

## 2021-03-26 ENCOUNTER — Encounter (HOSPITAL_COMMUNITY)
Admission: RE | Admit: 2021-03-26 | Discharge: 2021-03-26 | Disposition: A | Discharge status: Home / Self Care | Payer: Medicare HMO | Source: Ambulatory Visit | Attending: Internal Medicine | Admitting: Internal Medicine

## 2021-03-26 DIAGNOSIS — K31819 Angiodysplasia of stomach and duodenum without bleeding: Secondary | ICD-10-CM | POA: Diagnosis present

## 2021-03-26 DIAGNOSIS — Z7901 Long term (current) use of anticoagulants: Secondary | ICD-10-CM

## 2021-03-26 DIAGNOSIS — Z95811 Presence of heart assist device: Secondary | ICD-10-CM

## 2021-03-26 DIAGNOSIS — J449 Chronic obstructive pulmonary disease, unspecified: Secondary | ICD-10-CM

## 2021-03-26 LAB — POCT INR: INR: 2.3 (ref 2.0–3.0)

## 2021-03-26 MED ORDER — OCTREOTIDE ACETATE 20 MG IM KIT
20.0000 mg | PACK | INTRAMUSCULAR | Status: DC
  Administered 2021-03-26: 20 mg via INTRAMUSCULAR
  Filled 2021-03-26: qty 1

## 2021-03-26 NOTE — Progress Notes (Signed)
LVAD INR 

## 2021-03-27 ENCOUNTER — Ambulatory Visit (HOSPITAL_COMMUNITY)
Admission: RE | Admit: 2021-03-27 | Discharge: 2021-03-27 | Disposition: A | Discharge status: Home / Self Care | Payer: Medicare HMO | Source: Ambulatory Visit | Attending: Cardiology | Admitting: Cardiology

## 2021-03-27 ENCOUNTER — Ambulatory Visit (HOSPITAL_BASED_OUTPATIENT_CLINIC_OR_DEPARTMENT_OTHER)
Admission: RE | Admit: 2021-03-27 | Discharge: 2021-03-27 | Disposition: A | Discharge status: Home / Self Care | Payer: Medicare HMO | Source: Ambulatory Visit | Attending: Internal Medicine | Admitting: Internal Medicine

## 2021-03-27 ENCOUNTER — Other Ambulatory Visit: Payer: Self-pay

## 2021-03-27 ENCOUNTER — Ambulatory Visit (HOSPITAL_COMMUNITY): Payer: Self-pay

## 2021-03-27 VITALS — BP 94/0 | HR 71 | Ht 71.0 in | Wt 214.2 lb

## 2021-03-27 DIAGNOSIS — T827XXA Infection and inflammatory reaction due to other cardiac and vascular devices, implants and grafts, initial encounter: Secondary | ICD-10-CM | POA: Diagnosis not present

## 2021-03-27 DIAGNOSIS — I5022 Chronic systolic (congestive) heart failure: Secondary | ICD-10-CM

## 2021-03-27 DIAGNOSIS — X58XXXA Exposure to other specified factors, initial encounter: Secondary | ICD-10-CM | POA: Diagnosis not present

## 2021-03-27 DIAGNOSIS — Z95811 Presence of heart assist device: Secondary | ICD-10-CM | POA: Diagnosis not present

## 2021-03-27 DIAGNOSIS — G4733 Obstructive sleep apnea (adult) (pediatric): Secondary | ICD-10-CM | POA: Diagnosis not present

## 2021-03-27 DIAGNOSIS — Z79899 Other long term (current) drug therapy: Secondary | ICD-10-CM | POA: Diagnosis not present

## 2021-03-27 DIAGNOSIS — E119 Type 2 diabetes mellitus without complications: Secondary | ICD-10-CM | POA: Insufficient documentation

## 2021-03-27 DIAGNOSIS — L24A9 Irritant contact dermatitis due friction or contact with other specified body fluids: Secondary | ICD-10-CM | POA: Diagnosis not present

## 2021-03-27 DIAGNOSIS — F17200 Nicotine dependence, unspecified, uncomplicated: Secondary | ICD-10-CM | POA: Insufficient documentation

## 2021-03-27 DIAGNOSIS — J449 Chronic obstructive pulmonary disease, unspecified: Secondary | ICD-10-CM

## 2021-03-27 DIAGNOSIS — I159 Secondary hypertension, unspecified: Secondary | ICD-10-CM

## 2021-03-27 DIAGNOSIS — Z7901 Long term (current) use of anticoagulants: Secondary | ICD-10-CM | POA: Diagnosis not present

## 2021-03-27 DIAGNOSIS — Z86718 Personal history of other venous thrombosis and embolism: Secondary | ICD-10-CM | POA: Diagnosis not present

## 2021-03-27 DIAGNOSIS — I428 Other cardiomyopathies: Secondary | ICD-10-CM | POA: Diagnosis not present

## 2021-03-27 DIAGNOSIS — E785 Hyperlipidemia, unspecified: Secondary | ICD-10-CM | POA: Diagnosis not present

## 2021-03-27 LAB — CBC
HCT: 33.2 % — ABNORMAL LOW (ref 39.0–52.0)
Hemoglobin: 10 g/dL — ABNORMAL LOW (ref 13.0–17.0)
MCH: 25.4 pg — ABNORMAL LOW (ref 26.0–34.0)
MCHC: 30.1 g/dL (ref 30.0–36.0)
MCV: 84.3 fL (ref 80.0–100.0)
Platelets: 262 10*3/uL (ref 150–400)
RBC: 3.94 MIL/uL — ABNORMAL LOW (ref 4.22–5.81)
RDW: 15.2 % (ref 11.5–15.5)
WBC: 7.2 10*3/uL (ref 4.0–10.5)
nRBC: 0 % (ref 0.0–0.2)

## 2021-03-27 LAB — LACTATE DEHYDROGENASE: LDH: 192 U/L (ref 98–192)

## 2021-03-27 LAB — COMPREHENSIVE METABOLIC PANEL
ALT: 20 U/L (ref 0–44)
AST: 18 U/L (ref 15–41)
Albumin: 4.1 g/dL (ref 3.5–5.0)
Alkaline Phosphatase: 111 U/L (ref 38–126)
Anion gap: 8 (ref 5–15)
BUN: 17 mg/dL (ref 8–23)
CO2: 22 mmol/L (ref 22–32)
Calcium: 8.9 mg/dL (ref 8.9–10.3)
Chloride: 106 mmol/L (ref 98–111)
Creatinine, Ser: 1.24 mg/dL (ref 0.61–1.24)
GFR, Estimated: >60 mL/min (ref >60)
Glucose, Bld: 162 mg/dL — ABNORMAL HIGH (ref 70–99)
Potassium: 4.5 mmol/L (ref 3.5–5.1)
Sodium: 136 mmol/L (ref 135–145)
Total Bilirubin: 0.5 mg/dL (ref 0.3–1.2)
Total Protein: 7 g/dL (ref 6.5–8.1)

## 2021-03-27 LAB — PROTIME-INR
INR: 2.2 — ABNORMAL HIGH (ref 0.8–1.2)
Prothrombin Time: 24.8 seconds — ABNORMAL HIGH (ref 11.4–15.2)

## 2021-03-27 LAB — C-REACTIVE PROTEIN: CRP: 0.7 mg/dL (ref <1.0)

## 2021-03-27 LAB — ECHOCARDIOGRAM COMPLETE: S' Lateral: 4 cm

## 2021-03-27 LAB — SEDIMENTATION RATE: Sed Rate: 1 mm/hr (ref 0–16)

## 2021-03-27 MED ORDER — HYDRALAZINE HCL 50 MG PO TABS: 100.0000 mg | ORAL_TABLET | Freq: Three times a day (TID) | ORAL | 6 refills | Status: DC

## 2021-03-27 NOTE — Patient Instructions (Addendum)
1. Please be sure to drink plenty of fluids everyday. 2. We have increased your speed today to 5800 3. Return to clinic in 2 months

## 2021-03-27 NOTE — Progress Notes (Signed)
LVAD INR 

## 2021-03-27 NOTE — Progress Notes (Addendum)
Patient presents for 2 mo f/u in [facility] today with his wife. Reports no problems with VAD equipment or concerns with drive line.  Pt had an echo this morning, Dr. [name] reviewed. We will increase pts speed today, pts aortic valve is opening 5/5 beats on echo.  Pt states that he has been feeling great lately. Denies any SOB (other than walking long distances), or blood in his stool. Reports lightheaded episodes when he bends over, but these resolve with position change. Pt states that he did pass out on Easter but felt fine afterwards.   Reports he is still smoking a few cigarettes a day. Encouraged complete cessation. Pt encouraged to use nicorette gum or the patch.  Wound dressing changes as below. Remains on Keflex TID per ID.   Pt has a tear in his driveline near the connection of the driveline into the controller. This area is completely bent over and pt has rescue tape and medical tape over this area. Due to this I changed pts modular cable today to LOT [id number]; Exp 11/28/23, using his backup controller. Old primary controller placed in pts backup bag to be utilized as the new back up controller.    Vital Signs:  Doppler Pressure: 94 Automatc BP: 113/90 (95) HR: 71 SPO2: 100% RA  Weight: 214.2 lb w/o eqt Last weight: 213.4 lb  VAD Indication: Destination Therapy due to smoking status  VAD interrogation & Equipment Management: Speed: 5700 Flow:  3.9 Power:  4.2w    PI: 4.3 Hct: 40  Alarms: none Events: >100 PI events today Fixed speed: 5700 Low speed limit: 5400  Primary Controller:  Replace back up battery in 10 months. Back up controller:   Replace back up battery in 27 months.  Annual Equipment Maintenance on UBC/PM was performed on 07/2020.   I reviewed the LVAD parameters from today and compared the results to the patient's prior recorded data. LVAD interrogation was NEGATIVE for significant power changes, NEGATIVE for clinical alarms and NEGATIVE for PI  events/speed drops. No programming changes were made and pump is functioning within specified parameters. Pt is performing daily controller and system monitor self tests along with completing weekly and monthly maintenance for LVAD equipment.  LVAD equipment check completed and is in good working order. Back-up equipment present.   Abdominal incision care: Abdominal incision with gauze dressing in place. Dressing removed. No drainage from open areas. Cleansed with betadine.Covered w/MepilexWife instructed to change weekly or prn when visibly soiled.     Exit Site Care: Drive line is being maintained twice a week by [employer]. Gauze dressing with anchor intact and accurately applied. Existing dressing removed using sterile technique. Site cleansed with betadine swab x 2 and allowed to dry. Topped with dry 4 x 4. No drainage noted. No foul odor or tenderness. Exit site with moderate tissue ingrowth. Pt had a tiny abscess that was opened, drained, cultured and cauterized with silver nitrate. Anchor intact. May advance to weekly dressing changes using the daily kit. Patient given 14 daily dressing kits, 25 anchors, sterile saline wipes, and betadine swabs.    Device: N/A   BP & Labs:  Doppler 94 - Doppler is reflecting modified systolic   Hgb 21.3 - No S/S of bleeding. Specifically denies BRBPR. This is down 3 grams from March. We will recheck in 1 month   LDH stable at 192 with established baseline of 150 - 220. Denies tea-colored urine. No power elevations noted on interrogation.     Patient  Instructions: 1. Please be sure to drink plenty of fluids everyday. 2. We have increased your speed today to 5800 3. Return to clinic in 1 month following octreotide to have CBC rechecked. 4. Return to clinic in 2 months  Tanda Rockers RN [place] Coordinator  Office: [phone]  24/7 Pager: [phone]   60 y.o. with history of nonischemic cardiomyopathy, RLE DVT, cirrhosis, smoking, and OSA  returns for followup of CHF/LVAD placement.  Cardiomyopathy was diagnosed in 6/19 in [place], [place] at that time showed low output.  He was admitted to [facility] in 9/19 with low output HF and was started on milrinone and diuresed.  Unable to wean off milrinone.  He had a degree of RV failure, but this improved on milrinone.  Valvular heart disease also looked better with milrinone and diuresis.  On 08/03/18, he had Heartmate 3 LVAD placed.  Speed was optimized by ramp echo post-op.  Post-op course was relatively unremarkable.  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  No abscess on CT.  He was started on cefazolin IV for 6 wks.  BP-active meds decreased with low MAP.   He was admitted in 3/21 with subxiphoid abscess and cellulitis.  CT showed collection along the course of the driveline.  There was no evidence for sternal osteomyelitis.  Abscess was debrided, MSSA grew from wound cultures.  Wound vac was placed.  Patient was started on cefazolin, which was recently stopped after completing 8 wks.   Patient was re-admitted later in 3/21 with facial Zoster.  He was treated with acyclovir and this has resolved.   He was admitted in 6/21 with bleeding from the site of the subxiphoid abscess as well as cellulitis.  He required multiple units of PRBCs.  He went to the OR initially for I&D.  Blood and wound cultures grew MSSA.  He then went back to the OR for rectus flap over wound site (plastics).  He was started on cefazolin and rifabutin, has PICC.   He was admitted in 7/21 with GI bleeding, he had 6 units PRBCs.  EGD showed duodenal AVMs, treated with APC.  INR goal with warfarin lowered to 1.8-2.3.   Echo was done today, difficult study with EF 25-30%, mild LVH, aortic valve difficult to visualize but appears to open with each beat, mildly dilated RV with moderately decreased systolic function.   He returns for LVAD  followup.   Still smoking a couple of cigarettes/day, unable to tolerate Wellbutrin (felt like a "zombie").  Driveline site gradually healing.  There was a very small abscess next to the driveline today that was incised and drained today.  Weight stable.  No significant exertional dyspnea. No BRBPR/melena.  MAP mildly elevated at 95.  He had an episode of near-syncope at Easter, no recurrence.  No episodes of lightheadedness.   Denies LVAD alarms.  Reports taking Coumadin as prescribed and adherence to anticoagulation based dietary restrictions.  Denies bright red blood per rectum or melena, no dark urine or hematuria.    Labs (9/19): LDH 210, INR 2.17, WBCs 17.1 => 15, hgb 9.7, creatinine 0.72 Labs (10/19): K 4.3, creatinine 0.78 => 0.83, hgb 10.2 Labs (11/19): creatinine 0.79, hgb 10 Labs (2/20): K 3.9, creatinine 0.79 Labs (4/20): K 4, creatinine 1.15, hgb 11.4, LDH 199, INR 1.9 Labs (5/20): hgb 10.4, WBCs 8.5 Labs (6/20): K 4.2, creatinine 1.06, hgb 11.2 Labs (8/20): k 3.7, creatinine 1.0,  hgb 11.5, LDH 186 Labs (10/20): K 3.5, creatinine 0.93, INR 2, LDH 221 Labs (12/20): hgb 15.6, K 3.7, creatinine 1.02, LDH 250 Labs (4/21): K 4.1, creatinine 0.86, hgb 11.1 Labs (6/21): K 4.1, creatinine 0.76, hgb 10, WBCs 11.2 Labs (8/21): hgb 10.6 Labs (10/21): hgb 13.2, creatinine 1.17  PMH: 1. Degenerative disc disease.  2. GERD 3. Chronic systolic CHF:  Nonischemic cardiomyopathy.  Dilated cardiomyopathy diagnosed 6/19 in [place].  LHC/RHC in 7/19 showed elevated filling pressures, low cardiac output, and no significant CAD.   - Echo (9/19): Severe LV dilation with EF 10-20%, moderate-severe MR, severely dilated RV with mildly decreased systolic function, severe TR.  - Cardiac MRI (9/19): EF 14%, moderate dilated LV, severely dilated RV with mod-severe systolic function and EF 03%, nonspecific RV insertion site LGE.  - RHC (5/19) on milrinone 0.375: mean RA 8, PA 40/19, mean PCWP 12, PAPi 2.65, CI  2.71.  - Echo (9/19) on milrinone and diuresed): EF 20-25% with moderate LV dilation, moderately dilated RV with mildly decreased systolic function, mild-moderate MR, moderate TR, PASP 51 mmHg.  Cannot rule out noncompaction.  - Heartmate 3 LVAD placement in 9/19.  - Echo (5/22): EF 25-30%, mild LVH, aortic valve difficult to visualize but appears to open with each beat, mildly dilated RV with moderately decreased systolic function. 4. OSA: CPAP use.  5. Prior smoker 6. Type 2 diabetes 7. Hyperlipidemia.  8. H/o NSVT 9. 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  10. RLE DVT: found in 9/19.  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 Subxiphoid abscess involving collection along driveline in 0/09, MSSA.  6/21 Rectus flap to cover abscess site.  12. Facial Zoster 3/21 13. 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   Current Outpatient Medications  Medication Sig Dispense Refill  . acetaminophen (TYLENOL) 500 MG tablet Take 1,000 mg by mouth every 6 (six) hours as needed for mild pain.    . carbamide peroxide (DEBROX) 6.5 % OTIC solution Place 5 drops into both ears 3 (three) times daily. 15 mL 0  . cephALEXin (KEFLEX) 500 MG capsule Take 1 capsule (500 mg total) by mouth 3 (three) times daily. 90 capsule 11  . cyanocobalamin 500 MCG tablet Take 1,000 mcg by mouth daily.     Marland Kitchen docusate sodium (COLACE) 100 MG capsule Take 2 capsules (200 mg total) by mouth daily as needed for mild constipation. 10 capsule 0  . fluticasone (FLONASE) 50 MCG/ACT nasal spray Place 1 spray into both nostrils 2 (two) times daily as needed for allergies. 9.9 mL 5  . gabapentin (NEURONTIN) 300 MG capsule Take 2 capsules (600 mg total) by mouth 4 (four) times daily. 240 capsule 6  . glucose blood (CONTOUR NEXT TEST) test strip Use as instructed 100 each 12  . hydrOXYzine (ATARAX/VISTARIL) 50 MG tablet Take 1 tablet (50 mg total) by mouth 3 (three)  times daily as needed for itching. (Patient taking differently: Take 50 mg by mouth 2 (two) times daily as needed for itching.) 40 tablet 3  . Ipratropium-Albuterol (COMBIVENT) 20-100 MCG/ACT AERS respimat Inhale 1 puff into the lungs every 6 (six) hours as needed for wheezing or shortness of breath. 4 g 5  . Lancets (FREESTYLE) lancets Use as instructed 100 each 12  . nystatin (MYCOSTATIN/NYSTOP) powder Apply 1 application topically 3 (three) times daily. 15 g 3  . pantoprazole (PROTONIX) 40 MG tablet Take 1 tablet (40 mg total) by mouth daily.  Take 40 mg twice daily for 8 weeks (2 months) and then back down to 40 mg daily (Patient taking differently: Take 40 mg by mouth daily.) 90 tablet 3  . sacubitril-valsartan (ENTRESTO) 49-51 MG Take 1 tablet by mouth 2 (two) times daily. 60 tablet 11  . sildenafil (VIAGRA) 100 MG tablet Take 0.5 tablets (50 mg total) by mouth daily as needed for erectile dysfunction. 10 tablet 6  . spironolactone (ALDACTONE) 25 MG tablet Take 1 tablet (25 mg total) by mouth daily. 30 tablet 5  . thiamine 100 MG tablet Take 1 tablet (100 mg total) by mouth daily. 30 tablet 0  . warfarin (COUMADIN) 5 MG tablet TAKE 1 TABLET BY MOUTH DAILY EXCEPT 1 AND 1/2 TABLETS ON MONDAY,WEDNESDAY AND FRIDAY AS DIRECTED 100 tablet 0  . Zinc Gluconate 100 MG TABS Take 1 tablet (100 mg total) by mouth daily. 90 tablet 3  . zolpidem (AMBIEN) 5 MG tablet Take 1 tablet (5 mg total) by mouth at bedtime. 30 tablet 3  . hydrALAZINE (APRESOLINE) 50 MG tablet Take 2 tablets (100 mg total) by mouth 3 (three) times daily. 90 tablet 6   No current facility-administered medications for this encounter.    Chlorhexidine and Other  REVIEW OF SYSTEMS: All systems negative except as listed in HPI, PMH and Problem list.   LVAD INTERROGATION:  Please see LVAD nurse's note above for details.    I reviewed the LVAD parameters from today, and compared the results to the patient's prior recorded data.  No  programming changes were made.  The LVAD is functioning within specified parameters.  The patient performs LVAD self-test daily.  LVAD interrogation was negative for any significant power changes, alarms or PI events/speed drops.  LVAD equipment check completed and is in good working order.  Back-up equipment present.   LVAD education done on emergency procedures and precautions and reviewed exit site care.   MAP 95  Physical Exam: General: Well appearing this am. NAD.  HEENT: Normal. Neck: Supple, JVP 7-8 cm. Carotids OK.  Cardiac:  Mechanical heart sounds with LVAD hum present.  Lungs:  CTAB, normal effort.  Abdomen:  NT, ND, no HSM. No bruits or masses. +BS  LVAD exit site: Well-healed and incorporated. Dressing dry and intact. No erythema or drainage. Stabilization device present and accurately applied. Driveline dressing changed daily per sterile technique. Extremities:  Warm and dry. No cyanosis, clubbing, rash, or edema.  Neuro:  Alert & oriented x 3. Cranial nerves grossly intact. Moves all 4 extremities w/o difficulty. Affect pleasant      ASSESSMENT AND PLAN:   1. 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  LVAD parameters stable.  MAP remains mildly elevated.  NYHA class II.   He does not look volume overloaded.  Echo today showed aortic valve opening with each beat, LVAD speed increased to 5800 rpm.  - He does not need Lasix.      - Continue spironolactone 25 mg daily. BMET today.  - Continue Entresto 49/51 bid, can increase if MAP remains elevated in the future.  - Continue hydralazine 100 mg tid.  - He is off ASA with GI bleeding.  - Continue warfarin for INR 1.8-2.3.  CBC today.     - Should be transplant candidate eventually if he can quit smoking.  Will make transplant clinic referral when he is off totally.  2. Smoking: He has cut back a lot but still smoking.  Failed both  Wellbutrin and Chantix due to side effects.  3. RLE DVT: On  warfarin for LVAD.  4. OSA: Continue CPAP.  5. Hyperlipidemia: Atorvastatin.  6. Type II diabetes: Per PCP.  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  Small abscess at driveline site today underwent I&D.  - He will have long-term Keflex for MSSA suppression.  Followup with ID.  8. 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 No overt bleeding.  - Off ASA, INR goal now 1.8-2.3.  - Continue monthly octreotide injections.   Loralie Champagne 03/27/2021

## 2021-03-29 LAB — AEROBIC CULTURE W GRAM STAIN (SUPERFICIAL SPECIMEN): Culture: NORMAL

## 2021-04-02 ENCOUNTER — Ambulatory Visit (HOSPITAL_COMMUNITY): Payer: Self-pay

## 2021-04-02 LAB — POCT INR: INR: 2.3 (ref 2.0–3.0)

## 2021-04-02 NOTE — Progress Notes (Signed)
LVAD INR 

## 2021-04-08 ENCOUNTER — Other Ambulatory Visit (HOSPITAL_COMMUNITY): Payer: Self-pay | Admitting: Cardiology

## 2021-04-09 ENCOUNTER — Ambulatory Visit (HOSPITAL_COMMUNITY): Payer: Self-pay

## 2021-04-09 LAB — POCT INR: INR: 2.3 (ref 2.0–3.0)

## 2021-04-09 MED ORDER — WARFARIN SODIUM 5 MG PO TABS: ORAL_TABLET | ORAL | 0 refills | Status: DC

## 2021-04-09 NOTE — Patient Instructions (Signed)
Description   INR at goal - goal 1.8-2.3 2/2 GIB. Now on chronic cephalexin for suppression. Instructed to continue current regimen of warfarin 2.5 mg every Tuesday and 5 mg all other days.  Has new home INR machine - will check every Monday

## 2021-04-09 NOTE — Progress Notes (Signed)
LVAD INR 

## 2021-04-17 ENCOUNTER — Ambulatory Visit (HOSPITAL_COMMUNITY): Payer: Self-pay

## 2021-04-17 LAB — POCT INR: INR: 2.2 (ref 2.0–3.0)

## 2021-04-17 NOTE — Progress Notes (Signed)
LVAD INR 

## 2021-04-20 ENCOUNTER — Other Ambulatory Visit (HOSPITAL_COMMUNITY): Payer: Self-pay | Admitting: Unknown Physician Specialty

## 2021-04-20 DIAGNOSIS — Z95811 Presence of heart assist device: Secondary | ICD-10-CM

## 2021-04-20 DIAGNOSIS — Z7901 Long term (current) use of anticoagulants: Secondary | ICD-10-CM

## 2021-04-23 ENCOUNTER — Telehealth (HOSPITAL_COMMUNITY): Payer: Self-pay | Admitting: Unknown Physician Specialty

## 2021-04-23 ENCOUNTER — Ambulatory Visit (HOSPITAL_COMMUNITY)
Admission: RE | Admit: 2021-04-23 | Discharge: 2021-04-23 | Disposition: A | Discharge status: Home / Self Care | Payer: Medicare HMO | Source: Ambulatory Visit | Attending: Cardiology | Admitting: Cardiology

## 2021-04-23 ENCOUNTER — Encounter (HOSPITAL_COMMUNITY)
Admission: RE | Admit: 2021-04-23 | Discharge: 2021-04-23 | Disposition: A | Discharge status: Home / Self Care | Payer: Medicare HMO | Source: Ambulatory Visit | Attending: Cardiology | Admitting: Cardiology

## 2021-04-23 ENCOUNTER — Other Ambulatory Visit: Payer: Self-pay

## 2021-04-23 ENCOUNTER — Ambulatory Visit (HOSPITAL_COMMUNITY): Payer: Self-pay

## 2021-04-23 DIAGNOSIS — Z7901 Long term (current) use of anticoagulants: Secondary | ICD-10-CM | POA: Insufficient documentation

## 2021-04-23 DIAGNOSIS — Z95811 Presence of heart assist device: Secondary | ICD-10-CM | POA: Insufficient documentation

## 2021-04-23 DIAGNOSIS — K31819 Angiodysplasia of stomach and duodenum without bleeding: Secondary | ICD-10-CM | POA: Insufficient documentation

## 2021-04-23 LAB — CBC
HCT: 32.2 % — ABNORMAL LOW (ref 39.0–52.0)
Hemoglobin: 9.4 g/dL — ABNORMAL LOW (ref 13.0–17.0)
MCH: 24.4 pg — ABNORMAL LOW (ref 26.0–34.0)
MCHC: 29.2 g/dL — ABNORMAL LOW (ref 30.0–36.0)
MCV: 83.6 fL (ref 80.0–100.0)
Platelets: 269 10*3/uL (ref 150–400)
RBC: 3.85 MIL/uL — ABNORMAL LOW (ref 4.22–5.81)
RDW: 15.4 % (ref 11.5–15.5)
WBC: 6.9 10*3/uL (ref 4.0–10.5)
nRBC: 0 % (ref 0.0–0.2)

## 2021-04-23 LAB — POCT INR: INR: 2.3 (ref 2.0–3.0)

## 2021-04-23 MED ORDER — OCTREOTIDE ACETATE 20 MG IM KIT
20.0000 mg | PACK | INTRAMUSCULAR | Status: DC
  Administered 2021-04-23: 20 mg via INTRAMUSCULAR
  Filled 2021-04-23: qty 1

## 2021-04-23 NOTE — Telephone Encounter (Signed)
Call pt to inform him of lab results. Pt informed that his hgb is slowly trending down. Pt denies any dark stool or bright red blood. Pt states that he feels good and denies any fatigue or other issues. D/w Dr. Shirlee Latch, we will check an anemia panel with his regular labs at clinic next month. Pt is agreeable to the plan.  [name] RN, BSN VAD Coordinator 24/7 Pager [phone]

## 2021-04-23 NOTE — Progress Notes (Signed)
LVAD INR 

## 2021-05-01 ENCOUNTER — Ambulatory Visit (HOSPITAL_COMMUNITY): Payer: Self-pay

## 2021-05-01 LAB — POCT INR: INR: 2 (ref 2.0–3.0)

## 2021-05-01 NOTE — Progress Notes (Signed)
LVAD INR 

## 2021-05-07 ENCOUNTER — Ambulatory Visit (HOSPITAL_COMMUNITY): Payer: Self-pay

## 2021-05-07 LAB — POCT INR: INR: 2 (ref 2.0–3.0)

## 2021-05-07 NOTE — Progress Notes (Signed)
LVAD INR 

## 2021-05-14 ENCOUNTER — Ambulatory Visit (INDEPENDENT_AMBULATORY_CARE_PROVIDER_SITE_OTHER): Payer: Medicare HMO | Admitting: Infectious Diseases

## 2021-05-14 ENCOUNTER — Ambulatory Visit (HOSPITAL_COMMUNITY): Payer: Self-pay

## 2021-05-14 ENCOUNTER — Encounter: Payer: Self-pay | Admitting: Infectious Diseases

## 2021-05-14 ENCOUNTER — Other Ambulatory Visit: Payer: Self-pay

## 2021-05-14 ENCOUNTER — Ambulatory Visit (INDEPENDENT_AMBULATORY_CARE_PROVIDER_SITE_OTHER): Payer: Medicare HMO

## 2021-05-14 DIAGNOSIS — T8130XA Disruption of wound, unspecified, initial encounter: Secondary | ICD-10-CM | POA: Diagnosis not present

## 2021-05-14 DIAGNOSIS — Z23 Encounter for immunization: Secondary | ICD-10-CM

## 2021-05-14 DIAGNOSIS — T827XXA Infection and inflammatory reaction due to other cardiac and vascular devices, implants and grafts, initial encounter: Secondary | ICD-10-CM | POA: Diagnosis not present

## 2021-05-14 DIAGNOSIS — Z720 Tobacco use: Secondary | ICD-10-CM

## 2021-05-14 LAB — POCT INR: INR: 2 (ref 2.0–3.0)

## 2021-05-14 NOTE — Assessment & Plan Note (Addendum)
Theodore Welch seems to be doing very well. His driveline site seems to be very dry and stopped draining from their description. I think them stopping the damp saline dressing has allowed the wound to heal better. They will continue wound care at the interval discretion of VAD team (whom he sees in a week or so).  Reviewed his history of previous infections, including BSI. Will continue suppressive cephalexin TID. Check CRP / ESR at next visit along with CBC.

## 2021-05-14 NOTE — Assessment & Plan Note (Signed)
Continues to smoke 4 cigs a day.

## 2021-05-14 NOTE — Progress Notes (Signed)
Patient: Theodore Welch  DOB: January 11, 1961 MRN: [id number] PCP: Reubin Milan, MD    Subjective:  CC: FU chronic LVAD infection.  No concerns today.    ID Hx: Theodore Welch is a 60 y.o. 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   January-2020: MSSA infection of counter incision to mid abdomen; s/p serial debridements. Initially did not involve exit site at this time. Received IV Cefazolin x 4 weeks --> PO Cephalexin x 4 weeks.  [id number]: recurrent MSSA infection, now involving driveline exit site and secondary bacteremia. TEE deferred due to low sensitivity for detecting VAD endocarditis. 6 weeks IV Cefazolin s/p abd wall debridement --> cephalexin for chronic suppression for duration pump is in place.   March-2021 sudden onset subxiphoid abscess involving proximal driveline, MSSA on cultures. Blood cultures negative. S/P I&D of abdominal wall exit site and subxiphoid space. IV cefazolin x 8 weeks (May 10th 2021).  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   Apr 16, 2020 - readmitted following re-opening and bleeding of subxiphoid wound. +MSSA and taken for debridement 6/01. Required repair of outflow graft at this surgery as well (?sharp puncture vs degenerated graft d/t infection). Vertical Rectus Flap repair performed with serial debridements. BC (-). PICC Cefazolin + Rifabutin planned through 7/7  April 27, 2020 - bleeding from sternum r/t subxiphoid abscess --> debridement in OR, Cx again grew out MSSA. Back on IV cefazolin for treatment.   March 2022 - enterobacter cloacae growing from non-healing soft tissue wound. Tx 2 weeks cipro, continued on cephalexin.     HPI: [name] and Theodore Welch are here for routine follow up. They have been quite pleased that the driveline site has been exceptionally dry  recently healing. The two spots on the midabdominal scar line are not draining at all and very dry. She uses betadine to clean and covers with a allevyn foam dressing to keep clean.    Continues on Cephalexin 500 mg TID.  No concerns for any side effects to the antibiotics.  No new other medications.  Continues to follow-up with the LVAD team every 2 months.   He has not yet had his fourth COVID-vaccine. Interested in getting this today.     Review of Systems  Constitutional:  Negative for chills and fever.  HENT:  Negative for tinnitus.   Eyes:  Negative for blurred vision and photophobia.  Respiratory:  Negative for cough and sputum production.   Cardiovascular:  Negative for chest pain.  Gastrointestinal:  Negative for diarrhea, nausea and vomiting.  Genitourinary:  Negative for dysuria.  Skin:  Negative for rash.  Neurological:  Negative for headaches.     Past Medical History:  Diagnosis Date   Abscess 01/2020   sternal abscess   Cardiomyopathy, unspecified ([place])    CHF (congestive heart failure) ([facility])    Chronic back pain    Diabetes mellitus without complication ([place])    Enlarged heart    Gastric ulcer    Gastroenteritis    H/O degenerative disc disease    Hypertension    LVAD (left ventricular assist device) present [facility])     Outpatient Medications Prior to Visit  Medication Sig Dispense Refill   acetaminophen (TYLENOL) 500 MG tablet Take 1,000 mg by mouth every 6 (six) hours as needed for mild pain.     carbamide  peroxide (DEBROX) 6.5 % OTIC solution Place 5 drops into both ears 3 (three) times daily. 15 mL 0   cephALEXin (KEFLEX) 500 MG capsule Take 1 capsule (500 mg total) by mouth 3 (three) times daily. 90 capsule 11   cyanocobalamin 500 MCG tablet Take 1,000 mcg by mouth daily.      docusate sodium (COLACE) 100 MG capsule Take 2 capsules (200 mg total) by mouth daily as needed for mild constipation. 10 capsule 0   fluticasone (FLONASE) 50 MCG/ACT nasal spray  Place 1 spray into both nostrils 2 (two) times daily as needed for allergies. 9.9 mL 5   glucose blood (CONTOUR NEXT TEST) test strip Use as instructed 100 each 12   hydrALAZINE (APRESOLINE) 50 MG tablet Take 2 tablets (100 mg total) by mouth 3 (three) times daily. 90 tablet 6   hydrOXYzine (ATARAX/VISTARIL) 50 MG tablet Take 1 tablet (50 mg total) by mouth 3 (three) times daily as needed for itching. (Patient taking differently: Take 50 mg by mouth 2 (two) times daily as needed for itching.) 40 tablet 3   Ipratropium-Albuterol (COMBIVENT) 20-100 MCG/ACT AERS respimat Inhale 1 puff into the lungs every 6 (six) hours as needed for wheezing or shortness of breath. 4 g 5   Lancets (FREESTYLE) lancets Use as instructed 100 each 12   nystatin (MYCOSTATIN/NYSTOP) powder Apply 1 application topically 3 (three) times daily. 15 g 3   pantoprazole (PROTONIX) 40 MG tablet Take 1 tablet (40 mg total) by mouth daily. Take 40 mg twice daily for 8 weeks (2 months) and then back down to 40 mg daily (Patient taking differently: Take 40 mg by mouth daily.) 90 tablet 3   sacubitril-valsartan (ENTRESTO) 49-51 MG Take 1 tablet by mouth 2 (two) times daily. 60 tablet 11   sildenafil (VIAGRA) 100 MG tablet Take 0.5 tablets (50 mg total) by mouth daily as needed for erectile dysfunction. 10 tablet 6   spironolactone (ALDACTONE) 25 MG tablet Take 1 tablet (25 mg total) by mouth daily. 90 tablet 3   thiamine 100 MG tablet Take 1 tablet (100 mg total) by mouth daily. 30 tablet 0   warfarin (COUMADIN) 5 MG tablet TAKE 0.5 TABS (2.5 MG) ON TUESDAYS AND 1 TABLET (5 MG) ALL OTHER DAYS OR AS DIRECTED BY [facility] CLINIC 100 tablet 0   Zinc Gluconate 100 MG TABS Take 1 tablet (100 mg total) by mouth daily. 90 tablet 3   zolpidem (AMBIEN) 5 MG tablet Take 1 tablet (5 mg total) by mouth at bedtime. 30 tablet 3   gabapentin (NEURONTIN) 300 MG capsule Take 2 capsules (600 mg total) by mouth 4 (four) times daily. 240 capsule 6   No  facility-administered medications prior to visit.     Allergies  Allergen Reactions   Chlorhexidine Rash   Other Rash    Prep pads    Social History   Tobacco Use   Smoking status: Every Day    Packs/day: 0.25    Pack years: 0.00    Types: Cigarettes    Start date: 2003   Smokeless tobacco: Never   Tobacco comments:    slowed down  Vaping Use   Vaping Use: Never used  Substance Use Topics   Alcohol use: Not Currently   Drug use: Not Currently     Objective:   Vitals:   05/14/21 1133  Pulse: 85  Temp: (!) 97.2 F (36.2 C)  TempSrc: Oral  SpO2: 100%  Weight: 215 lb 8 oz (97.8  kg)   Body mass index is 30.06 kg/m.   Physical Exam Vitals reviewed.  Constitutional:      Appearance: Normal appearance. He is not ill-appearing.  Eyes:     General: No scleral icterus.    Pupils: Pupils are equal, round, and reactive to light.  Cardiovascular:     Rate and Rhythm: Normal rate.     Comments: LVAD Humm +  Pulmonary:     Effort: Pulmonary effort is normal.  Abdominal:     Palpations: Abdomen is soft.     Tenderness: There is no abdominal tenderness.       Comments: Superior concave divot along midline abd scar. Smaller in diameter compared to last assessment. < 0.5 cm in depth. No drainage.  Inferior concave divot along midline abd scar. < 0.25 cm deep. Completely epithelialized from what I can tell.  Driveline exit wound at LUQ is clean and dry.   Skin:    General: Skin is warm and dry.     Capillary Refill: Capillary refill takes less than 2 seconds.  Neurological:     Mental Status: He is alert and oriented to person, place, and time.      Lab Results: Lab Results  Component Value Date   WBC 6.9 04/23/2021   HGB 9.4 (L) 04/23/2021   HCT 32.2 (L) 04/23/2021   MCV 83.6 04/23/2021   PLT 269 04/23/2021    Lab Results  Component Value Date   CREATININE 1.24 03/27/2021   BUN 17 03/27/2021   NA 136 03/27/2021   K 4.5 03/27/2021   CL 106 03/27/2021    CO2 22 03/27/2021    Sed Rate (mm/hr)  Date Value  03/27/2021 1  01/16/2021 1  09/12/2020 2   CRP (mg/dL)  Date Value  03/27/2021 0.7  01/16/2021 0.7  08/15/2020 1.3 (H)     Assessment & Plan:   Problem List Items Addressed This Visit       High   Infection associated with driveline of left ventricular assist device (LVAD) ([place])    Argenis seems to be doing very well. His driveline site seems to be very dry and stopped draining from their description. I think them stopping the damp saline dressing has allowed the wound to heal better. They will continue wound care at the interval discretion of VAD team (whom he sees in a week or so).  Reviewed his history of previous infections, including BSI. Will continue suppressive cephalexin TID. Check CRP / ESR at next visit along with CBC.          Unprioritized   Tobacco use (Chronic)    Continues to smoke 4 cigs a day.        Abdominal wound dehiscence    Appears that these two spots have completely epithelialized. No drainage. Previously cultured enterobacter cloacea from the superior one that responded well to 2 weeks cipro and increased wound care frequency.  No changes today - wound care with soap / water and clean dry allevyn as he has been doing to avoid debris.         RTC 6 months.    Janene Madeira, MSN, NP-C [facility] Infectious [facility] [email or web address] Pager: [phone] Office: [phone] [place]: [phone]

## 2021-05-14 NOTE — Assessment & Plan Note (Signed)
Appears that these two spots have completely epithelialized. No drainage. Previously cultured enterobacter cloacea from the superior one that responded well to 2 weeks cipro and increased wound care frequency.  No changes today - wound care with soap / water and clean dry allevyn as he has been doing to avoid debris.

## 2021-05-14 NOTE — Patient Instructions (Signed)
Nice to see you today.   Please continue your cephalexin three times a day.   I will see if the VAD team can add on some helpful blood work for me but everything really looks great.   The two spots are healing in, the top one looks smaller. Would continue keeping it covered to avoid debris from getting in there and cleaning with soap and water.   Will plan to see you back in 6 months - if there is any concern for any new infection problems please let me know and VAD team as soon as you can tell.   We gave you your 4th COVID booster today - you are all up to date!

## 2021-05-14 NOTE — Progress Notes (Signed)
   Covid-19 Vaccination Clinic  Name:  Theodore Welch    MRN: [id number] DOB: 13-Jan-1961  05/14/2021  [name] was observed post Covid-19 immunization for 15 minutes without incident. He was provided with Vaccine Information Sheet and instruction to access the V-Safe system.   [name] was instructed to call 911 with any severe reactions post vaccine: Difficulty breathing  Swelling of face and throat  A fast heartbeat  A bad rash all over body  Dizziness and weakness   Immunizations Administered     Name Date Dose VIS Date Route   PFIZER Comrnaty(Gray TOP) Covid-19 Vaccine 05/14/2021 11:57 AM 0.3 mL 10/26/2020 Intramuscular   Manufacturer: [employer], [employer]   Lot: [id number]   NDC: [phone]

## 2021-05-14 NOTE — Progress Notes (Signed)
LVAD INR 

## 2021-05-22 ENCOUNTER — Ambulatory Visit (HOSPITAL_COMMUNITY)
Admission: RE | Admit: 2021-05-22 | Discharge: 2021-05-22 | Disposition: A | Discharge status: Home / Self Care | Payer: Medicare HMO | Source: Ambulatory Visit | Attending: Internal Medicine | Admitting: Internal Medicine

## 2021-05-22 ENCOUNTER — Ambulatory Visit (HOSPITAL_COMMUNITY): Payer: Self-pay

## 2021-05-22 DIAGNOSIS — Z95811 Presence of heart assist device: Secondary | ICD-10-CM | POA: Insufficient documentation

## 2021-05-22 DIAGNOSIS — K31819 Angiodysplasia of stomach and duodenum without bleeding: Secondary | ICD-10-CM

## 2021-05-22 LAB — POCT INR: INR: 2 (ref 2.0–3.0)

## 2021-05-22 MED ORDER — OCTREOTIDE ACETATE 20 MG IM KIT
20.0000 mg | PACK | INTRAMUSCULAR | Status: DC
  Administered 2021-05-22: 20 mg via INTRAMUSCULAR
  Filled 2021-05-22: qty 1

## 2021-05-22 NOTE — Progress Notes (Signed)
LVAD INR 

## 2021-05-24 ENCOUNTER — Ambulatory Visit: Payer: No Typology Code available for payment source | Admitting: Infectious Diseases

## 2021-05-25 ENCOUNTER — Other Ambulatory Visit (HOSPITAL_COMMUNITY): Payer: Self-pay | Admitting: Unknown Physician Specialty

## 2021-05-25 DIAGNOSIS — Z7901 Long term (current) use of anticoagulants: Secondary | ICD-10-CM

## 2021-05-25 DIAGNOSIS — Z95811 Presence of heart assist device: Secondary | ICD-10-CM

## 2021-05-28 ENCOUNTER — Other Ambulatory Visit: Payer: Self-pay

## 2021-05-28 ENCOUNTER — Ambulatory Visit (HOSPITAL_COMMUNITY): Payer: Self-pay

## 2021-05-28 ENCOUNTER — Ambulatory Visit (HOSPITAL_COMMUNITY)
Admission: RE | Admit: 2021-05-28 | Discharge: 2021-05-28 | Disposition: A | Discharge status: Home / Self Care | Payer: Medicare HMO | Source: Ambulatory Visit | Attending: Internal Medicine | Admitting: Internal Medicine

## 2021-05-28 VITALS — BP 100/0 | HR 74 | Ht 71.0 in | Wt 220.0 lb

## 2021-05-28 DIAGNOSIS — Z86718 Personal history of other venous thrombosis and embolism: Secondary | ICD-10-CM | POA: Diagnosis not present

## 2021-05-28 DIAGNOSIS — G4733 Obstructive sleep apnea (adult) (pediatric): Secondary | ICD-10-CM | POA: Insufficient documentation

## 2021-05-28 DIAGNOSIS — Z7901 Long term (current) use of anticoagulants: Secondary | ICD-10-CM

## 2021-05-28 DIAGNOSIS — E785 Hyperlipidemia, unspecified: Secondary | ICD-10-CM | POA: Diagnosis not present

## 2021-05-28 DIAGNOSIS — F17210 Nicotine dependence, cigarettes, uncomplicated: Secondary | ICD-10-CM | POA: Diagnosis not present

## 2021-05-28 DIAGNOSIS — I428 Other cardiomyopathies: Secondary | ICD-10-CM | POA: Insufficient documentation

## 2021-05-28 DIAGNOSIS — K5521 Angiodysplasia of colon with hemorrhage: Secondary | ICD-10-CM

## 2021-05-28 DIAGNOSIS — I5022 Chronic systolic (congestive) heart failure: Secondary | ICD-10-CM | POA: Diagnosis not present

## 2021-05-28 DIAGNOSIS — Z79899 Other long term (current) drug therapy: Secondary | ICD-10-CM | POA: Insufficient documentation

## 2021-05-28 DIAGNOSIS — Z95811 Presence of heart assist device: Secondary | ICD-10-CM | POA: Diagnosis present

## 2021-05-28 DIAGNOSIS — E119 Type 2 diabetes mellitus without complications: Secondary | ICD-10-CM | POA: Insufficient documentation

## 2021-05-28 DIAGNOSIS — B369 Superficial mycosis, unspecified: Secondary | ICD-10-CM

## 2021-05-28 DIAGNOSIS — K31811 Angiodysplasia of stomach and duodenum with bleeding: Secondary | ICD-10-CM

## 2021-05-28 LAB — CBC
HCT: 27.5 % — ABNORMAL LOW (ref 39.0–52.0)
Hemoglobin: 8 g/dL — ABNORMAL LOW (ref 13.0–17.0)
MCH: 22.2 pg — ABNORMAL LOW (ref 26.0–34.0)
MCHC: 29.1 g/dL — ABNORMAL LOW (ref 30.0–36.0)
MCV: 76.2 fL — ABNORMAL LOW (ref 80.0–100.0)
Platelets: 264 10*3/uL (ref 150–400)
RBC: 3.61 MIL/uL — ABNORMAL LOW (ref 4.22–5.81)
RDW: 15.9 % — ABNORMAL HIGH (ref 11.5–15.5)
WBC: 6 10*3/uL (ref 4.0–10.5)
nRBC: 0 % (ref 0.0–0.2)

## 2021-05-28 LAB — BASIC METABOLIC PANEL
Anion gap: 11 (ref 5–15)
BUN: 9 mg/dL (ref 8–23)
CO2: 20 mmol/L — ABNORMAL LOW (ref 22–32)
Calcium: 9 mg/dL (ref 8.9–10.3)
Chloride: 108 mmol/L (ref 98–111)
Creatinine, Ser: 0.95 mg/dL (ref 0.61–1.24)
GFR, Estimated: >60 mL/min (ref >60)
Glucose, Bld: 186 mg/dL — ABNORMAL HIGH (ref 70–99)
Potassium: 4.6 mmol/L (ref 3.5–5.1)
Sodium: 139 mmol/L (ref 135–145)

## 2021-05-28 LAB — VITAMIN B12: Vitamin B-12: 915 pg/mL — ABNORMAL HIGH (ref 180–914)

## 2021-05-28 LAB — C-REACTIVE PROTEIN: CRP: 0.6 mg/dL (ref <1.0)

## 2021-05-28 LAB — PROTIME-INR
INR: 1.9 — ABNORMAL HIGH (ref 0.8–1.2)
Prothrombin Time: 21.7 seconds — ABNORMAL HIGH (ref 11.4–15.2)

## 2021-05-28 LAB — FOLATE: Folate: 11.3 ng/mL (ref >5.9)

## 2021-05-28 LAB — IRON AND TIBC
Iron: 13 ug/dL — ABNORMAL LOW (ref 45–182)
Saturation Ratios: 3 % — ABNORMAL LOW (ref 17.9–39.5)
TIBC: 466 ug/dL — ABNORMAL HIGH (ref 250–450)
UIBC: 453 ug/dL

## 2021-05-28 LAB — FERRITIN: Ferritin: 7 ng/mL — ABNORMAL LOW (ref 24–336)

## 2021-05-28 LAB — SEDIMENTATION RATE: Sed Rate: 4 mm/hr (ref 0–16)

## 2021-05-28 LAB — LACTATE DEHYDROGENASE: LDH: 372 U/L — ABNORMAL HIGH (ref 98–192)

## 2021-05-28 MED ORDER — SILDENAFIL CITRATE 100 MG PO TABS
50.0000 mg | ORAL_TABLET | Freq: Every day | ORAL | 6 refills | Status: DC | PRN
Start: 2021-05-28 — End: 2022-05-27

## 2021-05-28 MED ORDER — CARBAMIDE PEROXIDE 6.5 % OT SOLN: 5.0000 [drp] | Freq: Three times a day (TID) | OTIC | 0 refills | Status: AC

## 2021-05-28 MED ORDER — NYSTATIN 100000 UNIT/GM EX POWD: 1.0000 "application " | Freq: Three times a day (TID) | CUTANEOUS | 3 refills | Status: DC

## 2021-05-28 NOTE — Addendum Note (Signed)
Encounter addended by: Lebron Quam, RN on: 05/28/2021 12:05 PM  Actions taken: Clinical Note Signed

## 2021-05-28 NOTE — Progress Notes (Signed)
LVAD INR 

## 2021-05-28 NOTE — Addendum Note (Signed)
Encounter addended by: Lebron Quam, RN on: 05/28/2021 1:26 PM  Actions taken: Visit diagnoses modified, Order list changed, Diagnosis association updated

## 2021-05-28 NOTE — Patient Instructions (Signed)
NO change in medications Advance to weekly dressing changes using the daily kit. You may shower. Provided patient with shower bag for home use. Demonstration along with written instructions and illustrated steps provided.  Patient and caregiver verbalized understanding of same. Here are some tips for washing:  Avoid getting the driveline exit site dressing wet, and consider planning bathing times around exit site dressing changes. Use only the shower bag we gave you to shower with and don't get creative with your equipment to shower. Water and electricity do not mix and will cause your pump to stop.   Sit on a chair or bathing stool in the bathtub or shower stall, and use a basin of warm water and washcloth or sponge to wash. Or, wash at the sink while standing on a towel, so as not to get the floor or bath rug wet. To wash your hair, try using a hand-held shower wand or sprayer while standing over the kitchen sink or bathtub. Be sure that all floor surfaces are dry when walking around after bathing, to avoid slipping. Do not use powder around the exit site dressing. Anytime your dressing appears wet CHANGE IT! Drink 2 large glasses of water prior to getting in shower for first time (always hydrate before shower). Caregiver needs to be accessible during first few showers. Call VAD Coordinator if any changes in appearance of drive line site.   3. Return to clinic in 2 months

## 2021-05-28 NOTE — Progress Notes (Signed)
Patient presents for 2 mo f/u in [facility] today with his wife. Reports no problems with VAD equipment or concerns with drive line.  Pt had an echo this morning, Dr. [name] reviewed. We will increase pts speed today, pts aortic valve is opening 5/5 beats on echo.  Pt states that he has been feeling great lately. Denies any SOB (other than walking long distances), or blood in his stool. Reports lightheaded episodes when he bends over, but these resolve with position change. Pt states that he did pass out on Easter but felt fine afterwards.   Reports he is still smoking a few cigarettes a day. Encouraged complete cessation. Pt encouraged to use nicorette gum or the patch.  Wound dressing changes as below. Remains on Keflex TID per ID.   Pt has a tear in his driveline near the connection of the driveline into the controller. This area is completely bent over and pt has rescue tape and medical tape over this area. Due to this I changed pts modular cable today to LOT [id number]; Exp 11/28/23, using his backup controller. Old primary controller placed in pts backup bag to be utilized as the new back up controller.    Vital Signs:  Doppler Pressure: 94 Automatc BP: 113/90 (95) HR: 71 SPO2: 100% RA   Weight: 214.2 lb w/o eqt Last weight: 213.4 lb  VAD Indication: Destination Therapy due to smoking status   VAD interrogation & Equipment Management: Speed: 5700 Flow:  3.9 Power:  4.2w    PI: 4.3 Hct: 40   Alarms: none Events: >100 PI events today Fixed speed: 5700 Low speed limit: 5400  Primary Controller:  Replace back up battery in 10 months. Back up controller:   Replace back up battery in 27 months.   Annual Equipment Maintenance on UBC/PM was performed on 07/2020.   I reviewed the LVAD parameters from today and compared the results to the patient's prior recorded data. LVAD interrogation was NEGATIVE for significant power changes, NEGATIVE for clinical alarms and NEGATIVE for PI  events/speed drops. No programming changes were made and pump is functioning within specified parameters. Pt is performing daily controller and system monitor self tests along with completing weekly and monthly maintenance for LVAD equipment.  LVAD equipment check completed and is in good working order. Back-up equipment present.   Abdominal incision care: Abdominal incision with gauze dressing in place. Dressing removed. No drainage from open areas. Cleansed with betadine.Covered w/Mepilex Wife instructed to change weekly or prn when visibly soiled.      Exit Site Care: Drive line is being maintained twice a week by [employer]. Gauze dressing with anchor intact and accurately applied.  Existing dressing removed using sterile technique. Site cleansed with betadine swab x 2 and allowed to dry. Topped with dry 4 x 4. No drainage noted. No foul odor or tenderness. Exit site with moderate tissue ingrowth. Pt had a tiny abscess that was opened, drained, cultured and cauterized with silver nitrate. Anchor intact. May advance to weekly dressing changes using the daily kit. Patient given 14 daily dressing kits, 25 anchors, sterile saline wipes, and betadine swabs.     Device: N/A   BP & Labs:  Doppler 94 - Doppler is reflecting modified systolic   Hgb 64.6 - No S/S of bleeding. Specifically denies BRBPR. This is down 3 grams from March. We will recheck in 1 month   LDH stable at 192 with established baseline of 150 - 220. Denies tea-colored urine. No power elevations  noted on interrogation.     Patient Instructions:  Tanda Rockers RN VAD Coordinator  Office: [phone]  24/7 Pager: [phone]    60 y.o. with history of nonischemic cardiomyopathy, RLE DVT, cirrhosis, smoking, and OSA returns for followup of CHF/LVAD placement.  Cardiomyopathy was diagnosed in 6/19 in [place], [place] at that time showed low output.  He was admitted to [facility] in 9/19 with low output HF and was started on  milrinone and diuresed.  Unable to wean off milrinone.  He had a degree of RV failure, but this improved on milrinone.  Valvular heart disease also looked better with milrinone and diuresis.  On 08/03/18, he had Heartmate 3 LVAD placed.  Speed was optimized by ramp echo post-op.  Post-op course was relatively unremarkable.  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  No abscess on CT.  He was started on cefazolin IV for 6 wks.  BP-active meds decreased with low MAP.   He was admitted in 3/21 with subxiphoid abscess and cellulitis.  CT showed collection along the course of the driveline.  There was no evidence for sternal osteomyelitis.  Abscess was debrided, MSSA grew from wound cultures.  Wound vac was placed.  Patient was started on cefazolin, which was recently stopped after completing 8 wks.   Patient was re-admitted later in 3/21 with facial Zoster.  He was treated with acyclovir and this has resolved.   He was admitted in 6/21 with bleeding from the site of the subxiphoid abscess as well as cellulitis.  He required multiple units of PRBCs.  He went to the OR initially for I&D.  Blood and wound cultures grew MSSA.  He then went back to the OR for rectus flap over wound site (plastics).  He was started on cefazolin and rifabutin, has PICC.   He was admitted in 7/21 with GI bleeding, he had 6 units PRBCs.  EGD showed duodenal AVMs, treated with APC.  INR goal with warfarin lowered to 1.8-2.3.   Echo in 5/22 was a difficult study with EF 25-30%, mild LVH, aortic valve difficult to visualize but appears to open with each beat, mildly dilated RV with moderately decreased systolic function.   He returns for LVAD followup.   Still smoking a couple of cigarettes/day, unable to tolerate Wellbutrin (felt like a "zombie").  Driveline site has healed.  MAP mildly elevated at 93.  Weight is up about 6 lbs, he has been eating a  lot and thinks this is why. No lightheadedness.  No significant exertional dyspnea.    Denies LVAD alarms.  Reports taking Coumadin as prescribed and adherence to anticoagulation based dietary restrictions.  Denies bright red blood per rectum or melena, no dark urine or hematuria.    Labs (9/19): LDH 210, INR 2.17, WBCs 17.1 => 15, hgb 9.7, creatinine 0.72 Labs (10/19): K 4.3, creatinine 0.78 => 0.83, hgb 10.2 Labs (11/19): creatinine 0.79, hgb 10 Labs (2/20): K 3.9, creatinine 0.79 Labs (4/20): K 4, creatinine 1.15, hgb 11.4, LDH 199, INR 1.9 Labs (5/20): hgb 10.4, WBCs 8.5 Labs (6/20): K 4.2, creatinine 1.06, hgb 11.2 Labs (8/20): k 3.7, creatinine 1.0, hgb 11.5, LDH 186 Labs (10/20): K 3.5, creatinine 0.93, INR 2, LDH 221 Labs (12/20): hgb 15.6, K 3.7, creatinine 1.02, LDH 250 Labs (4/21): K 4.1, creatinine 0.86, hgb 11.1 Labs (6/21): K 4.1, creatinine 0.76, hgb 10, WBCs 11.2 Labs (8/21): hgb 10.6 Labs (  10/21): hgb 13.2, creatinine 1.17 Labs (7/22): hgb 8, LDH 372, INR 1.9, creatinine 0.95  PMH: 1. Degenerative disc disease.  2. GERD 3. Chronic systolic CHF:  Nonischemic cardiomyopathy.  Dilated cardiomyopathy diagnosed 6/19 in [place].  LHC/RHC in 7/19 showed elevated filling pressures, low cardiac output, and no significant CAD.   - Echo (9/19): Severe LV dilation with EF 10-20%, moderate-severe MR, severely dilated RV with mildly decreased systolic function, severe TR.  - Cardiac MRI (9/19): EF 14%, moderate dilated LV, severely dilated RV with mod-severe systolic function and EF 49%, nonspecific RV insertion site LGE.  - RHC (5/19) on milrinone 0.375: mean RA 8, PA 40/19, mean PCWP 12, PAPi 2.65, CI 2.71.  - Echo (9/19) on milrinone and diuresed): EF 20-25% with moderate LV dilation, moderately dilated RV with mildly decreased systolic function, mild-moderate MR, moderate TR, PASP 51 mmHg.  Cannot rule out noncompaction.  - Heartmate 3 LVAD placement in 9/19.  - Echo (5/22): EF  25-30%, mild LVH, aortic valve difficult to visualize but appears to open with each beat, mildly dilated RV with moderately decreased systolic function. 4. OSA: CPAP use.  5. Prior smoker 6. Type 2 diabetes 7. Hyperlipidemia.  8. H/o NSVT 9. 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  10. RLE DVT: found in 9/19.  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 Subxiphoid abscess involving collection along driveline in 4/49, MSSA.  6/21 Rectus flap to cover abscess site.  12. Facial Zoster 3/21 13. 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   Current Outpatient Medications  Medication Sig Dispense Refill   acetaminophen (TYLENOL) 500 MG tablet Take 1,000 mg by mouth every 6 (six) hours as needed for mild pain.     cephALEXin (KEFLEX) 500 MG capsule Take 1 capsule (500 mg total) by mouth 3 (three) times daily. 90 capsule 11   cyanocobalamin 500 MCG tablet Take 1,000 mcg by mouth daily.      docusate sodium (COLACE) 100 MG capsule Take 2 capsules (200 mg total) by mouth daily as needed for mild constipation. 10 capsule 0   fluticasone (FLONASE) 50 MCG/ACT nasal spray Place 1 spray into both nostrils 2 (two) times daily as needed for allergies. 9.9 mL 5   gabapentin (NEURONTIN) 300 MG capsule Take 2 capsules (600 mg total) by mouth 4 (four) times daily. 240 capsule 6   hydrALAZINE (APRESOLINE) 50 MG tablet Take 2 tablets (100 mg total) by mouth 3 (three) times daily. 90 tablet 6   hydrOXYzine (ATARAX/VISTARIL) 50 MG tablet Take 1 tablet (50 mg total) by mouth 3 (three) times daily as needed for itching. (Patient taking differently: Take 50 mg by mouth 2 (two) times daily as needed for itching.) 40 tablet 3   Ipratropium-Albuterol (COMBIVENT) 20-100 MCG/ACT AERS respimat Inhale 1 puff into the lungs every 6 (six) hours as needed for wheezing or shortness of breath. 4 g 5   pantoprazole (PROTONIX) 40 MG tablet Take 1 tablet (40 mg  total) by mouth daily. Take 40 mg twice daily for 8 weeks (2 months) and then back down to 40 mg daily (Patient taking differently: Take 40 mg by mouth daily.) 90 tablet 3   sacubitril-valsartan (ENTRESTO) 49-51 MG Take 1 tablet by mouth 2 (two) times daily. 60 tablet 11   spironolactone (ALDACTONE) 25 MG tablet Take 1 tablet (25 mg total) by mouth daily. 90 tablet 3   thiamine 100 MG tablet Take 1 tablet (100 mg total)  by mouth daily. 30 tablet 0   warfarin (COUMADIN) 5 MG tablet TAKE 0.5 TABS (2.5 MG) ON TUESDAYS AND 1 TABLET (5 MG) ALL OTHER DAYS OR AS DIRECTED BY [facility] CLINIC 100 tablet 0   Zinc Gluconate 100 MG TABS Take 1 tablet (100 mg total) by mouth daily. 90 tablet 3   zolpidem (AMBIEN) 5 MG tablet Take 1 tablet (5 mg total) by mouth at bedtime. 30 tablet 3   carbamide peroxide (DEBROX) 6.5 % OTIC solution Place 5 drops into both ears 3 (three) times daily. 15 mL 0   glucose blood (CONTOUR NEXT TEST) test strip Use as instructed (Patient not taking: Reported on 05/28/2021) 100 each 12   Lancets (FREESTYLE) lancets Use as instructed (Patient not taking: Reported on 05/28/2021) 100 each 12   nystatin (MYCOSTATIN/NYSTOP) powder Apply 1 application topically 3 (three) times daily. 15 g 3   sildenafil (VIAGRA) 100 MG tablet Take 0.5 tablets (50 mg total) by mouth daily as needed for erectile dysfunction. 10 tablet 6   No current facility-administered medications for this encounter.    Chlorhexidine and Other  REVIEW OF SYSTEMS: All systems negative except as listed in HPI, PMH and Problem list.   LVAD INTERROGATION:  Please see LVAD nurse's note above for details.    I reviewed the LVAD parameters from today, and compared the results to the patient's prior recorded data.  No programming changes were made.  The LVAD is functioning within specified parameters.  The patient performs LVAD self-test daily.  LVAD interrogation was negative for any significant power changes, alarms or PI  events/speed drops.  LVAD equipment check completed and is in good working order.  Back-up equipment present.   LVAD education done on emergency procedures and precautions and reviewed exit site care.   MAP 93  Physical Exam: General: Well appearing this am. NAD.  HEENT: Normal. Neck: Supple, JVP 7-8 cm. Carotids OK.  Cardiac:  Mechanical heart sounds with LVAD hum present.  Lungs:  CTAB, normal effort.  Abdomen:  NT, ND, no HSM. No bruits or masses. +BS  LVAD exit site: Well-healed and incorporated. Dressing dry and intact. No erythema or drainage. Stabilization device present and accurately applied. Driveline dressing changed daily per sterile technique. Extremities:  Warm and dry. No cyanosis, clubbing, rash, or edema.  Neuro:  Alert & oriented x 3. Cranial nerves grossly intact. Moves all 4 extremities w/o difficulty. Affect pleasant      ASSESSMENT AND PLAN:   1. 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  LVAD parameters stable.  MAP slightly elevated.  NYHA class II.   He does not look volume overloaded. However, LDH is mildly elevated at 372.   - He does not need Lasix.      - Continue spironolactone 25 mg daily. BMET today.  - Continue Entresto 49/51 bid, can increase if MAP remains elevated in the future.  - Continue hydralazine 100 mg tid.  - He is off ASA with GI bleeding.  - Continue warfarin for INR 1.8-2.3.    - Elevated LDH with stable LVAD parameters, repeat LDH in 1 week.  - Should be transplant candidate eventually if he can quit smoking.  Will make transplant clinic referral when he is off totally.  2. Smoking: He has cut back a lot but still smoking.  Failed both Wellbutrin and Chantix due to side effects.  3. RLE DVT: On warfarin for LVAD.  4. OSA: Continue CPAP.  5. Hyperlipidemia: Atorvastatin.  6. Type II diabetes: Per PCP.  7. 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  Site improved.  - He will have long-term Keflex for MSSA suppression.  Followup with ID.  8. 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 No overt bleeding but hgb today noted to be down to 8.  - Off ASA, INR goal now 1.8-2.3.  - Continue monthly octreotide injections.  - Send Fe studies and repeat CBC in 1 week.  - Will have him see GI again, may need repeat enteroscopy.   Loralie Champagne 05/28/2021

## 2021-05-28 NOTE — Progress Notes (Addendum)
Patient presents for 2 mo f/u in [facility] today with his wife. Reports no problems with VAD equipment or concerns with drive line.  Pt states that he has been feeling great lately. Denies any SOB (other than walking long distances), or blood in his stool.   Reports he is still smoking a few cigarettes a day. Encouraged complete cessation. Pt encouraged to use nicorette gum or the patch.  Wound dressing changes as below. Remains on Keflex TID per ID.   I have refilled pts Debrox, nystatin, and Viagra.   Vital Signs:  Doppler Pressure: 100 Automatc BP: 117/75 (93) HR: 74 SPO2: 100% RA   Weight: 220 lb w/controller only Last weight: 214.2 lb  VAD Indication: Destination Therapy due to smoking status   VAD interrogation & Equipment Management: Speed: 5800 Flow:  4.2 Power:  4.4w    PI: 3.7 Hct: 32   Alarms: none Events: 70+ PI events today Fixed speed: 5700 Low speed limit: 5400  Primary Controller:  Replace back up battery in 8 months. Back up controller:   Replace back up battery in 25 months.   Annual Equipment Maintenance on UBC/PM was performed on 07/2020.   I reviewed the LVAD parameters from today and compared the results to the patient's prior recorded data. LVAD interrogation was NEGATIVE for significant power changes, NEGATIVE for clinical alarms and NEGATIVE for PI events/speed drops. No programming changes were made and pump is functioning within specified parameters. Pt is performing daily controller and system monitor self tests along with completing weekly and monthly maintenance for LVAD equipment.  LVAD equipment check completed and is in good working order. Back-up equipment present.   Abdominal incision care: Abdominal incision with gauze dressing in place.  No drainage from open areas. Covered w/Mepilex. Wife instructed to change weekly or prn when visibly soiled.   Exit Site Care: Drive line is being maintained twice a week by [employer]. Gauze dressing  with anchor intact and accurately appliedj however pt did not have an anchor applied.  Existing dressing removed using sterile technique. Site cleansed with betadine swab x 2 and allowed to dry. Topped w/ gauze dressing but no silver strip. No drainage noted. No foul odor or tenderness. Exit site with moderate tissue ingrowth.  Anchor applied. May advance to weekly dressing changes using the daily kit. Patient given 8 daily dressing kits, betadine swabs, adhesive remover and Qtips. Pt is asking if he can shower. Provided pt with large tegaderms to cover dressing. Pt instructed after shower, [name] must change the dressing.  Device: N/A   BP & Labs:  Doppler 100 - Doppler is reflecting modified systolic   Hgb 8 - No S/S of bleeding. Specifically denies BRBPR. This is down 3 grams from March. We will recheck in 1 week   LDH elevated at 372 with established baseline of 150 - 220. Denies tea-colored urine. No power elevations noted on interrogation.     Patient Instructions: NO change in medications Advance to weekly dressing changes using the daily kit. You may shower. Provided patient with shower bag for home use. Demonstration along with written instructions and illustrated steps provided.  Patient and caregiver verbalized understanding of same. Here are some tips for washing:  Avoid getting the driveline exit site dressing wet, and consider planning bathing times around exit site dressing changes. Use only the shower bag we gave you to shower with and don't get creative with your equipment to shower. Water and electricity do not mix and will  cause your pump to stop.   Sit on a chair or bathing stool in the bathtub or shower stall, and use a basin of warm water and washcloth or sponge to wash. Or, wash at the sink while standing on a towel, so as not to get the floor or bath rug wet. To wash your hair, try using a hand-held shower wand or sprayer while standing over the kitchen sink or bathtub. Be  sure that all floor surfaces are dry when walking around after bathing, to avoid slipping. Do not use powder around the exit site dressing. Anytime your dressing appears wet CHANGE IT! Drink 2 large glasses of water prior to getting in shower for first time (always hydrate before shower). Caregiver needs to be accessible during first few showers. Call VAD Coordinator if any changes in appearance of drive line site.   3. Return to clinic in 1 week for CBC, and LDH. 4. We will schedule you for 2 doses of IV Fereheme. 5. GI will be reaching out to you with an appt. 6. Return to clinic in 2 months   [place] Coordinator  Office: [phone]  24/7 Pager: [phone]

## 2021-05-28 NOTE — Addendum Note (Signed)
Encounter addended by: Lebron Quam, RN on: 05/28/2021 1:17 PM  Actions taken: Vitals modified, Clinical Note Signed

## 2021-05-30 LAB — HM DIABETES EYE EXAM

## 2021-06-01 ENCOUNTER — Other Ambulatory Visit (HOSPITAL_COMMUNITY): Payer: Self-pay | Admitting: Unknown Physician Specialty

## 2021-06-01 ENCOUNTER — Encounter (HOSPITAL_COMMUNITY)
Admission: RE | Admit: 2021-06-01 | Discharge: 2021-06-01 | Disposition: A | Discharge status: Home / Self Care | Payer: Medicare HMO | Source: Ambulatory Visit | Attending: Cardiology | Admitting: Cardiology

## 2021-06-01 DIAGNOSIS — K31811 Angiodysplasia of stomach and duodenum with bleeding: Secondary | ICD-10-CM | POA: Diagnosis present

## 2021-06-01 DIAGNOSIS — K5521 Angiodysplasia of colon with hemorrhage: Secondary | ICD-10-CM | POA: Insufficient documentation

## 2021-06-01 DIAGNOSIS — Z7901 Long term (current) use of anticoagulants: Secondary | ICD-10-CM

## 2021-06-01 DIAGNOSIS — Z95811 Presence of heart assist device: Secondary | ICD-10-CM

## 2021-06-01 MED ORDER — SODIUM CHLORIDE 0.9 % IV SOLN
510.0000 mg | INTRAVENOUS | Status: DC
  Administered 2021-06-01: 510 mg via INTRAVENOUS
  Filled 2021-06-01: qty 510

## 2021-06-01 MED ORDER — FERUMOXYTOL INJECTION 510 MG/17 ML: 510.0000 mg | INTRAVENOUS | Status: DC

## 2021-06-04 ENCOUNTER — Other Ambulatory Visit: Payer: Self-pay | Admitting: Unknown Physician Specialty

## 2021-06-04 ENCOUNTER — Ambulatory Visit (HOSPITAL_COMMUNITY): Payer: Self-pay

## 2021-06-04 ENCOUNTER — Ambulatory Visit (HOSPITAL_COMMUNITY): Admission: RE | Admit: 2021-06-04 | Payer: Medicare HMO | Source: Ambulatory Visit

## 2021-06-04 ENCOUNTER — Encounter (HOSPITAL_COMMUNITY): Payer: Self-pay | Admitting: Unknown Physician Specialty

## 2021-06-04 ENCOUNTER — Other Ambulatory Visit (HOSPITAL_COMMUNITY): Payer: Self-pay | Admitting: *Deleted

## 2021-06-04 ENCOUNTER — Other Ambulatory Visit: Payer: Self-pay

## 2021-06-04 ENCOUNTER — Encounter (HOSPITAL_COMMUNITY): Payer: Self-pay

## 2021-06-04 DIAGNOSIS — Z95811 Presence of heart assist device: Secondary | ICD-10-CM

## 2021-06-04 DIAGNOSIS — Z7901 Long term (current) use of anticoagulants: Secondary | ICD-10-CM

## 2021-06-04 LAB — POCT INR: INR: 1.9 — AB (ref 2.0–3.0)

## 2021-06-04 NOTE — Progress Notes (Signed)
LVAD INR 

## 2021-06-04 NOTE — Progress Notes (Signed)
Referral packet faxed to  GI today per their request.  [name] RN, BSN VAD Coordinator 24/7 Pager [phone]

## 2021-06-05 ENCOUNTER — Ambulatory Visit (HOSPITAL_COMMUNITY)
Admission: RE | Admit: 2021-06-05 | Discharge: 2021-06-05 | Disposition: A | Discharge status: Home / Self Care | Payer: Medicare HMO | Source: Ambulatory Visit | Attending: Cardiology | Admitting: Cardiology

## 2021-06-05 ENCOUNTER — Other Ambulatory Visit (HOSPITAL_COMMUNITY): Payer: Self-pay | Admitting: *Deleted

## 2021-06-05 DIAGNOSIS — Z95811 Presence of heart assist device: Secondary | ICD-10-CM | POA: Diagnosis not present

## 2021-06-05 LAB — CBC
HCT: 31.3 % — ABNORMAL LOW (ref 39.0–52.0)
Hemoglobin: 9 g/dL — ABNORMAL LOW (ref 13.0–17.0)
MCH: 22.5 pg — ABNORMAL LOW (ref 26.0–34.0)
MCHC: 28.8 g/dL — ABNORMAL LOW (ref 30.0–36.0)
MCV: 78.3 fL — ABNORMAL LOW (ref 80.0–100.0)
Platelets: 325 10*3/uL (ref 150–400)
RBC: 4 MIL/uL — ABNORMAL LOW (ref 4.22–5.81)
RDW: 18.1 % — ABNORMAL HIGH (ref 11.5–15.5)
WBC: 8.5 10*3/uL (ref 4.0–10.5)
nRBC: 0.2 % (ref 0.0–0.2)

## 2021-06-05 LAB — LACTATE DEHYDROGENASE: LDH: 178 U/L (ref 98–192)

## 2021-06-05 NOTE — Telephone Encounter (Signed)
Refill called in to patient's pharmacy for patient's Ambien 5 mg qHS per Dr Shirlee Latch.   Alyce Pagan RN VAD Coordinator  Office: [phone]  24/7 Pager: [phone]

## 2021-06-08 ENCOUNTER — Other Ambulatory Visit: Payer: Self-pay

## 2021-06-08 ENCOUNTER — Encounter (HOSPITAL_COMMUNITY)
Admission: RE | Admit: 2021-06-08 | Discharge: 2021-06-08 | Disposition: A | Discharge status: Home / Self Care | Payer: Medicare HMO | Source: Ambulatory Visit | Attending: Cardiology | Admitting: Cardiology

## 2021-06-08 DIAGNOSIS — K5521 Angiodysplasia of colon with hemorrhage: Secondary | ICD-10-CM | POA: Diagnosis not present

## 2021-06-08 MED ORDER — SODIUM CHLORIDE 0.9 % IV SOLN
510.0000 mg | INTRAVENOUS | Status: AC
  Administered 2021-06-08: 510 mg via INTRAVENOUS
  Filled 2021-06-08: qty 17

## 2021-06-12 ENCOUNTER — Ambulatory Visit (HOSPITAL_COMMUNITY): Payer: Self-pay

## 2021-06-12 LAB — POCT INR: INR: 2.1 (ref 2.0–3.0)

## 2021-06-12 NOTE — Progress Notes (Signed)
LVAD INR 

## 2021-06-13 ENCOUNTER — Encounter: Payer: Self-pay | Admitting: Physician Assistant

## 2021-06-18 ENCOUNTER — Ambulatory Visit (HOSPITAL_COMMUNITY): Payer: Self-pay | Admitting: Pharmacist

## 2021-06-18 LAB — POCT INR: INR: 2 (ref 2.0–3.0)

## 2021-06-18 NOTE — Progress Notes (Signed)
LVAD INR 

## 2021-06-19 ENCOUNTER — Encounter (HOSPITAL_COMMUNITY)
Admission: RE | Admit: 2021-06-19 | Discharge: 2021-06-19 | Disposition: A | Discharge status: Home / Self Care | Payer: Medicare HMO | Source: Ambulatory Visit | Attending: Cardiology | Admitting: Cardiology

## 2021-06-19 ENCOUNTER — Other Ambulatory Visit: Payer: Self-pay

## 2021-06-19 DIAGNOSIS — Z95811 Presence of heart assist device: Secondary | ICD-10-CM | POA: Diagnosis present

## 2021-06-19 DIAGNOSIS — K31819 Angiodysplasia of stomach and duodenum without bleeding: Secondary | ICD-10-CM | POA: Diagnosis not present

## 2021-06-19 MED ORDER — OCTREOTIDE ACETATE 20 MG IM KIT
20.0000 mg | PACK | INTRAMUSCULAR | Status: DC
  Administered 2021-06-19: 20 mg via INTRAMUSCULAR
  Filled 2021-06-19: qty 1

## 2021-06-25 ENCOUNTER — Other Ambulatory Visit: Payer: Self-pay

## 2021-06-25 ENCOUNTER — Ambulatory Visit (INDEPENDENT_AMBULATORY_CARE_PROVIDER_SITE_OTHER): Payer: Medicare HMO | Admitting: Nurse Practitioner

## 2021-06-25 ENCOUNTER — Other Ambulatory Visit (INDEPENDENT_AMBULATORY_CARE_PROVIDER_SITE_OTHER): Payer: Medicare HMO

## 2021-06-25 ENCOUNTER — Ambulatory Visit (HOSPITAL_COMMUNITY): Payer: Self-pay | Admitting: Pharmacist

## 2021-06-25 ENCOUNTER — Encounter: Payer: Self-pay | Admitting: Nurse Practitioner

## 2021-06-25 VITALS — BP 130/80 | HR 61 | Ht 71.0 in

## 2021-06-25 DIAGNOSIS — D509 Iron deficiency anemia, unspecified: Secondary | ICD-10-CM

## 2021-06-25 DIAGNOSIS — K5521 Angiodysplasia of colon with hemorrhage: Secondary | ICD-10-CM | POA: Diagnosis not present

## 2021-06-25 LAB — BASIC METABOLIC PANEL
BUN: 13 mg/dL (ref 6–23)
CO2: 22 mEq/L (ref 19–32)
Calcium: 9.6 mg/dL (ref 8.4–10.5)
Chloride: 105 mEq/L (ref 96–112)
Creatinine, Ser: 1.09 mg/dL (ref 0.40–1.50)
GFR: 72.83 mL/min (ref >60.00)
Glucose, Bld: 139 mg/dL — ABNORMAL HIGH (ref 70–99)
Potassium: 3.8 mEq/L (ref 3.5–5.1)
Sodium: 139 mEq/L (ref 135–145)

## 2021-06-25 LAB — CBC
HCT: 40 % (ref 39.0–52.0)
Hemoglobin: 12.6 g/dL — ABNORMAL LOW (ref 13.0–17.0)
MCHC: 31.4 g/dL (ref 30.0–36.0)
MCV: 78.1 fl (ref 78.0–100.0)
Platelets: 240 10*3/uL (ref 150.0–400.0)
RBC: 5.13 Mil/uL (ref 4.22–5.81)
RDW: 27 % — ABNORMAL HIGH (ref 11.5–15.5)
WBC: 9 10*3/uL (ref 4.0–10.5)

## 2021-06-25 LAB — POCT INR: INR: 2.2 (ref 2.0–3.0)

## 2021-06-25 NOTE — Patient Instructions (Signed)
If you are age 62 or older, your body mass index should be between 23-30. Your Body mass index is 30.68 kg/m. If this is out of the aforementioned range listed, please consider follow up with your Primary Care Provider.  If you are age 52 or younger, your body mass index should be between 19-25. Your Body mass index is 30.68 kg/m. If this is out of the aformentioned range listed, please consider follow up with your Primary Care Provider.   The [facility] providers would like to encourage you to use [facility] to communicate with providers for non-urgent requests or questions.  Due to long hold times on the telephone, sending your provider a message by [facility] may be faster and more efficient way to get a response. Please allow 48 business hours for a response.  Please remember that this is for non-urgent requests/questions.  LABS:  Lab work has been ordered for you today. Our lab is located in the basement. Press "B" on the elevator. The lab is located at the first door on the left as you exit the elevator.  HEALTHCARE LAWS AND MY CHART RESULTS: Due to recent changes in healthcare laws, you may see the results of your imaging and laboratory studies on MyChart before your provider has had a chance to review them.   We understand that in some cases there may be results that are confusing or concerning to you. Not all laboratory results come back in the same time frame and the provider may be waiting for multiple results in order to interpret others.  Please give [name] 48 hours in order for your provider to thoroughly review all the results before contacting the office for clarification of your results.   It was great seeing you today! Thank you for entrusting me with your care and choosing [facility].  Arnaldo Natal, CRNP

## 2021-06-25 NOTE — Progress Notes (Signed)
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    He has a history of upper GI bleed admitted to the hospital 05/2020.  Admission Hg 4.5. He required 6 units of PRBCs during this admission.  He underwent an enteroscopy by Dr. Barron Alvine 06/12/2020 identified a single bleeding AVM in the duodenum treated with APC and a localized area of mucosal irregularity in the fourth portion of the duodenum was noted, that the appearance of this area could be consistent with small adenoma versus telangiectasia versus benign granularity.  This area was not biopsied due to the active small bowel bleeding.  A repeat enteroscopy with biopsies of the fourth portion of the duodenum in the future was considered.  Pantoprazole 40 mg p.o. twice daily for 8 weeks was ordered then once daily.  He has not been seen in our outpatient clinic prior to today.  He presents to our office today as referred by his cardiologist Dr. Marca Ancona for further evaluation regarding anemia, recent drop in his hemoglobin level. His Hg level was 9.4 on 04/23/2021 -> Hg 8.0 on 05/28/2021 -> Hg 9.0 on 06/05/2021.   He received IV iron on 06/08/2021. Overall, he reports feeling well.  No dysphagia or heartburn.  No upper or lower abdominal pain.  He is passing normal formed brown bowel movement daily.  His appetite is good and his weight is stable.  He remains on Pantoprazole 40 mg daily.  He is scheduled for a colonoscopy at the [facility] hospital 07/25/2021.   Enteroscopy by Dr. Barron Alvine 06/12/2020: - Normal esophagus. -  Normal stomach. 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 Treated with argon plasma coagulation (APC). - Normal mucosa was found in the duodenal bulb and in the fourth portion of the duodenum. - Localized area of mucosal irregularity in the 4th portion of the duodenum. Appearance could be consistent with small adenoma vs telangeictasia vs benign granularity. This was not biopsied due to the active small bowel bleeding already. - No specimens collected. but potentially at lower INR target per Cardiology service. - ASA held. - Could consider repeat enteroscopy in the future with biopsy of the noted area of mucosal irregularity in the 4th portion of the duodenum.  Patient relates that he has had prior colonoscopies through the [place] in [place], and keeps up-to-date with those. He believes that he has had some small polyps in the past but was told that everything looked good at the time of last colonoscopy which was about 5 years ago.   ECHO 03/27/2021: 1. Left ventricular ejection fraction, by estimation, is 25 to 30%. The left ventricle has severely decreased function. Left ventricular endocardial border not optimally defined to evaluate regional wall motion. There is mild left ventricular hypertrophy. Left ventricular diastolic function could not be evaluated. LVAD inflow cannula at apex. 2. Right ventricular systolic function is moderately reduced. The right ventricular size is mildly enlarged. 3. Left atrial size was mildly dilated. 4. The mitral valve was not well visualized. No evidence of mitral valve regurgitation. No evidence of mitral stenosis. 5. The aortic valve was not well  visualized, difficult to tell with certainty but aortic valve appeared to open with each beat. Aortic valve regurgitation is not visualized. 6. Aortic dilatation noted. There is mild dilatation of the aortic root, measuring 38 mm. 7. IVC not visualized. 8. Technically difficult study with poor  images.  CBC Latest Ref Rng & Units 06/05/2021 05/28/2021 04/23/2021  WBC 4.0 - 10.5 K/uL 8.5 6.0 6.9  Hemoglobin 13.0 - 17.0 g/dL 9.0(L) 8.0(L) 9.4(L)  Hematocrit 39.0 - 52.0 % 31.3(L) 27.5(L) 32.2(L)  Platelets 150 - 400 K/uL 325 264 269     CMP Latest Ref Rng & Units 05/28/2021 03/27/2021 01/16/2021  Glucose 70 - 99 mg/dL 409(W186(H) 119(J162(H) 478(G159(H)  BUN 8 - 23 mg/dL 9 17 7(L)  Creatinine 9.560.61 - 1.24 mg/dL 2.130.95 0.861.24 5.780.99  Sodium 135 - 145 mmol/L 139 136 139  Potassium 3.5 - 5.1 mmol/L 4.6 4.5 3.7  Chloride 98 - 111 mmol/L 108 106 106  CO2 22 - 32 mmol/L 20(L) 22 23  Calcium 8.9 - 10.3 mg/dL 9.0 8.9 4.6(N8.8(L)  Total Protein 6.5 - 8.1 g/dL - 7.0 6.7  Total Bilirubin 0.3 - 1.2 mg/dL - 0.5 0.6  Alkaline Phos 38 - 126 U/L - 111 127(H)  AST 15 - 41 U/L - 18 20  ALT 0 - 44 U/L - 20 25      Past Medical History:  Diagnosis Date   Abscess 01/2020   sternal abscess   Cardiomyopathy, unspecified (HCC)    CHF (congestive heart failure) (HCC)    Chronic back pain    Diabetes mellitus without complication (HCC)    Enlarged heart    Gastric ulcer    Gastroenteritis    H/O degenerative disc disease    Hypertension    LVAD (left ventricular assist device) present [facility](HCC)    Past Surgical History:  Procedure Laterality Date   APPLICATION OF A-CELL OF CHEST/ABDOMEN N/A 04/27/2020   Procedure: APPLICATION OF A-CELL OF CHEST;  Surgeon: Peggye Formillingham, Claire S, DO;  Location: [facility] OR;  Service: Plastics;  Laterality: N/A;   APPLICATION OF WOUND VAC N/A 12/03/2018   Procedure: APPLICATION OF WOUND VAC;  Surgeon: Donata ClayVan Trigt, Theron AristaPeter, MD;  [facility];  Service: Thoracic;  Laterality: N/A;   APPLICATION OF WOUND VAC N/A 12/09/2018   Procedure: Removal of Wound Vac, Application of ACELL;  Surgeon: Kerin PernaVan Trigt, Peter, MD;  [facility];  Service: Thoracic;  Laterality: N/A;   APPLICATION OF WOUND VAC N/A 01/26/2020   Procedure: APPLICATION OF WOUND VAC;  Surgeon: Kerin PernaVan Trigt, Peter, MD;  [facility];  Service:  Thoracic;  Laterality: N/A;   APPLICATION OF WOUND VAC N/A 01/31/2020   Procedure: WOUND VAC CHANGE;  Surgeon: Kerin PernaVan Trigt, Peter, MD;  [facility];  Service: Thoracic;  Laterality: N/A;   APPLICATION OF WOUND VAC N/A 04/18/2020   Procedure: Application Of Wound Vac;  Surgeon: Kerin PernaVan Trigt, Peter, MD;  [facility];  Service: Vascular;  Laterality: N/A;   APPLICATION OF WOUND VAC N/A 04/21/2020   Procedure: WOUND VAC CHANGE;  Surgeon: Kerin PernaVan Trigt, Peter, MD;  [facility];  Service: Thoracic;  Laterality: N/A;   APPLICATION OF WOUND VAC N/A 04/27/2020   Procedure: APPLICATION OF PREVENA INCISIONAL WOUND VAC;  Surgeon: Peggye Formillingham, Claire S, DO;  Location: [facility] OR;  Service: Plastics;  Laterality: N/A;   ENTEROSCOPY N/A 06/12/2020   Procedure: ENTEROSCOPY;  Surgeon: Shellia Cleverlyirigliano, Vito V, DO;  Location: [facility] ENDOSCOPY;  Service: Gastroenterology;  Laterality: N/A;   HOT  HEMOSTASIS N/A 06/12/2020   Procedure: HOT HEMOSTASIS (ARGON PLASMA COAGULATION/BICAP);  Surgeon: Shellia Cleverly, DO;  Location: [facility] ENDOSCOPY;  Service: Gastroenterology;  Laterality: N/A;   INSERTION OF IMPLANTABLE LEFT VENTRICULAR ASSIST DEVICE N/A 08/03/2018   Procedure: INSERTION OF IMPLANTABLE LEFT VENTRICULAR ASSIST DEVICE - HM3;  Surgeon: Alleen Borne, MD;  Location: [facility] OR;  Service: Open Heart Surgery;  Laterality: N/A;  HM3   LUMBAR LAMINECTOMY/DECOMPRESSION MICRODISCECTOMY     ORCHIECTOMY     PECTORALIS FLAP N/A 04/27/2020   Procedure: MUSCLE FLAP TO STERNUM;  Surgeon: Peggye Form, DO;  Location: [facility] OR;  Service: Plastics;  Laterality: N/A;   RIGHT HEART CATH N/A 07/24/2018   Procedure: RIGHT HEART CATH;  Surgeon: Laurey Morale, MD;  Location: [facility] INVASIVE CV LAB;  Service: Cardiovascular;  Laterality: N/A;   STERNAL WOUND DEBRIDEMENT N/A 12/03/2018   Procedure: WOUND DEBRIDEMENT WITH A-CELL;  Surgeon: Kerin Perna, MD;  Location[facility] OR;  Service: Thoracic;  Laterality: N/A;   STERNAL WOUND DEBRIDEMENT N/A  01/26/2020   Procedure: STERNAL WOUND & driveline IRRIGATION AND DEBRIDEMENT;  Surgeon: Kerin Perna, MD;  Location: [facility] OR;  Service: Thoracic;  Laterality: N/A;   STERNAL WOUND DEBRIDEMENT N/A 01/31/2020   Procedure: STERNAL WOUND IRRIGATION;  Surgeon: Kerin Perna, MD;  Location: [facility] OR;  Service: Thoracic;  Laterality: N/A;  MESONIC JET   STERNAL WOUND DEBRIDEMENT N/A 02/04/2020   Procedure: STERNAL WOUND DEBRIDEMENT WITH WOUND VAC CHANGE;  Surgeon: Kerin Perna, MD;  [facility] OR;  Service: Thoracic;  Laterality: N/A;   STERNAL WOUND DEBRIDEMENT N/A 04/27/2020   Procedure: STERNAL WOUND DEBRIDEMENT;  Surgeon: Kerin Perna, MD;  Location: [facility] OR;  Service: Thoracic;  Laterality: N/A;   TEE WITHOUT CARDIOVERSION N/A 08/03/2018   Procedure: TRANSESOPHAGEAL ECHOCARDIOGRAM (TEE);  Surgeon: Alleen Borne, MD;  Location: [facility] OR;  Service: Open Heart Surgery;  Laterality: N/A;   WOUND DEBRIDEMENT  04/18/2020   Procedure: Debridement Abdominal Wound;  Surgeon: Kerin Perna, MD;  Location[facility] OR;  Service: Vascular;;   WOUND EXPLORATION N/A 04/18/2020   Procedure: REPAIR OF LVAD;  Surgeon: Kerin Perna, MD;  Location: [facility] OR;  Service: Vascular;  Laterality: N/A;   Social History: He is divorced.  He has 4 sons.   He reports that he has been smoking cigarettes. He started smoking about 19 years ago. He has been smoking an average of .25 packs per day. He has never used smokeless tobacco. He reports previous alcohol use. He reports previous drug use.  Family History: Father deceased MI. Mother deceased brain aneurysm.  No known family history of esophageal, gastric or colon cancer.   Allergies  Allergen Reactions   Chlorhexidine Rash    Blisters   Other Rash    Prep pads      Outpatient Encounter Medications as of 06/25/2021  Medication Sig   acetaminophen (TYLENOL) 500 MG tablet Take 1,000 mg by mouth every 6 (six) hours as needed for mild pain.   carbamide peroxide (DEBROX)  6.5 % OTIC solution Place 5 drops into both ears 3 (three) times daily.   cephALEXin (KEFLEX) 500 MG capsule Take 1 capsule (500 mg total) by mouth 3 (three) times daily.   cyanocobalamin 500 MCG tablet Take 1,000 mcg by mouth daily.    docusate sodium (COLACE) 100 MG capsule Take 2 capsules (200 mg total) by mouth daily as needed for mild constipation.   fluticasone (FLONASE) 50 MCG/ACT nasal  spray Place 1 spray into both nostrils 2 (two) times daily as needed for allergies.   gabapentin (NEURONTIN) 300 MG capsule Take 2 capsules (600 mg total) by mouth 4 (four) times daily.   glucose blood (CONTOUR NEXT TEST) test strip Use as instructed (Patient not taking: Reported on 05/28/2021)   hydrALAZINE (APRESOLINE) 50 MG tablet Take 2 tablets (100 mg total) by mouth 3 (three) times daily.   hydrOXYzine (ATARAX/VISTARIL) 50 MG tablet Take 1 tablet (50 mg total) by mouth 3 (three) times daily as needed for itching. (Patient taking differently: Take 50 mg by mouth 2 (two) times daily as needed for itching.)   Ipratropium-Albuterol (COMBIVENT) 20-100 MCG/ACT AERS respimat Inhale 1 puff into the lungs every 6 (six) hours as needed for wheezing or shortness of breath.   Lancets (FREESTYLE) lancets Use as instructed (Patient not taking: Reported on 05/28/2021)   nystatin (MYCOSTATIN/NYSTOP) powder Apply 1 application topically 3 (three) times daily.   pantoprazole (PROTONIX) 40 MG tablet Take 1 tablet (40 mg total) by mouth daily. Take 40 mg twice daily for 8 weeks (2 months) and then back down to 40 mg daily (Patient taking differently: Take 40 mg by mouth daily.)   sacubitril-valsartan (ENTRESTO) 49-51 MG Take 1 tablet by mouth 2 (two) times daily.   sildenafil (VIAGRA) 100 MG tablet Take 0.5 tablets (50 mg total) by mouth daily as needed for erectile dysfunction.   spironolactone (ALDACTONE) 25 MG tablet Take 1 tablet (25 mg total) by mouth daily.   thiamine 100 MG tablet Take 1 tablet (100 mg total) by mouth  daily.   warfarin (COUMADIN) 5 MG tablet TAKE 0.5 TABS (2.5 MG) ON TUESDAYS AND 1 TABLET (5 MG) ALL OTHER DAYS OR AS DIRECTED BY [facility] CLINIC   Zinc Gluconate 100 MG TABS Take 1 tablet (100 mg total) by mouth daily.   zolpidem (AMBIEN) 5 MG tablet Take 1 tablet (5 mg total) by mouth at bedtime.   No facility-administered encounter medications on file as of 06/25/2021.    REVIEW OF SYSTEMS:  Gen: Denies fever, sweats or chills. No weight loss.  CV: See HPI. Denies chest pain, palpitations or edema. Resp: Denies cough, shortness of breath of hemoptysis.  GI: See HPI. GU : Denies urinary burning, blood in urine, increased urinary frequency or incontinence. MS: + Back pain.  Derm: Denies rash, itchiness, skin lesions or unhealing ulcers. Psych: + Anxiety.  Heme: Denies bruising, bleeding. Neuro:  + headaches. No dizziness or paresthesias. Endo:  Denies any problems with DM, thyroid or adrenal function.   PHYSICAL EXAM: BP 130/80   Pulse 61   Ht 5\' 11"  (1.803 m)   SpO2 96%   BMI 30.68 kg/m   General: 60 year old male in no acute distress. Head: Normocephalic and atraumatic. Eyes:  Sclerae non-icteric, conjunctive pink. Ears: Normal auditory acuity. Mouth: Dentition intact. No ulcers or lesions.  Neck: Supple, no lymphadenopathy or thyromegaly.  Lungs: Clear bilaterally to auscultation without wheezes, crackles or rhonchi. Heart: Regular rate and rhythm. Mechanic heart sounds, LVAD hum.  Abdomen: Soft, nontender, non distended. No masses. No hepatosplenomegaly. Normoactive bowel sounds x 4 quadrants. Multiple abdominal scars intact. Mid abdominal drsg intact. LVAD drsg intact.  Rectal: Deferred. Musculoskeletal: Symmetrical with no gross deformities. Skin: Warm and dry. No rash or lesions on visible extremities. Extremities: No edema. Neurological: Alert oriented x 4, no focal deficits.  Psychological:  Alert and cooperative. Normal mood and affect.  ASSESSMENT AND PLAN:  60 year  old male  with a history of anemia and upper GI bleed 05/2020 secondary to a duodenal AVM  per EGD 06/12/2020 on Coumadin.  EGD also showed a localized mucosal irregularity to the 4th portion of the duodenum, future repeat enteroscopy was considered. Recently his Hg levels dropped. Hg level 10  -> 9.4 -> 8.0. Received IV iron. Hg 9.0 on 06/05/2021. No overt GI bleeding.  -CBC, iron, iron saturation, TIBC, ferritin and BMP today -I will consult with Dr. Marina Goodell to verify if a repeat enteroscopy warranted at this time  -Continue Pantoprazole 40 mg daily -Patient will call our office if he develops any black stool or rectal bleeding  History of colon polyps.  -Patient is scheduled for a colonoscopy at the [facility] in [place] on 07/25/2021  Ischemic cardiomyopathy with LV EF 25 - 30% s/p LVAD placement 07/2018 on Coumadin   OSA use CPAP  DM II    CC:  Laurey Morale, MD

## 2021-06-25 NOTE — Progress Notes (Signed)
LVAD INR 

## 2021-06-26 ENCOUNTER — Other Ambulatory Visit (HOSPITAL_COMMUNITY): Payer: Self-pay | Admitting: Cardiology

## 2021-06-26 LAB — IRON,TIBC AND FERRITIN PANEL
%SAT: 32 % (calc) (ref 20–48)
Ferritin: 85 ng/mL (ref 24–380)
Iron: 114 ug/dL (ref 50–180)
TIBC: 358 mcg/dL (calc) (ref 250–425)

## 2021-06-26 NOTE — Progress Notes (Signed)
Theodore Welch, 1.  Assessment and laboratories from yesterday reviewed.  Now with normal ferritin and iron.  Hemoglobin has improved to 12.6. 2.  No overt GI bleeding 3.  No indication for enteroscopy at this time 4.  Not sure why he is being followed here and GI elsewhere, but noted. 5.  You may want to set him up with hematology so that they can monitor his iron levels and provide IV iron infusion and/or blood transfusion on an as-needed basis.  He will certainly develop recurrent iron deficiency anemia given his history and chronic anticoagulation status. Thank you Dr. Marina Goodell

## 2021-06-27 ENCOUNTER — Telehealth: Payer: Self-pay

## 2021-06-27 NOTE — Telephone Encounter (Signed)
Patient notified of the recommendations and agrees to this plan.

## 2021-06-27 NOTE — Progress Notes (Signed)
Lauren,  Please contact the patient and let him know his labs showed a normal iron and ferritin level. Hg improved 12.6. Let him know Dr. Marina Goodell did not assess he needed an enteroscopy at this time.   [facility] to [facility]   Please refer him to hematology to monitor his anemia, iron levels and provide IV iron or infusions as needed.

## 2021-06-27 NOTE — Telephone Encounter (Addendum)
-----   Message from Arnaldo Natal, NP sent at 06/27/2021 11:52 AM EDT -----  [name], Please contact the patient and let him know his labs showed a normal iron and ferritin level. Hg improved 12.6. Let him know Dr. Marina Goodell did not assess he needed an enteroscopy at this time.   Patient to proceed with [facility] as [facility].   Please refer him to hematology to monitor his anemia, iron levels and provide IV iron or infusions as needed.

## 2021-07-02 ENCOUNTER — Ambulatory Visit (HOSPITAL_COMMUNITY): Payer: Self-pay | Admitting: Pharmacist

## 2021-07-02 ENCOUNTER — Other Ambulatory Visit: Payer: Self-pay | Admitting: *Deleted

## 2021-07-02 DIAGNOSIS — D509 Iron deficiency anemia, unspecified: Secondary | ICD-10-CM

## 2021-07-02 LAB — POCT INR: INR: 1.9 — AB (ref 2.0–3.0)

## 2021-07-02 NOTE — Progress Notes (Signed)
LVAD INR 

## 2021-07-03 ENCOUNTER — Telehealth: Payer: Self-pay | Admitting: Hematology and Oncology

## 2021-07-03 NOTE — Telephone Encounter (Signed)
Received a new hem referral from [facility] for [facility]DA. [name] has been cld and scheduled to see Dr. Al Pimple on 8/24 at 10am. Pt aware to arrive 20 minutes early.

## 2021-07-09 ENCOUNTER — Ambulatory Visit (HOSPITAL_COMMUNITY): Payer: Self-pay | Admitting: Pharmacist

## 2021-07-09 LAB — POCT INR: INR: 2.2 (ref 2.0–3.0)

## 2021-07-09 NOTE — Progress Notes (Signed)
LVAD INR 

## 2021-07-11 ENCOUNTER — Other Ambulatory Visit: Payer: Self-pay

## 2021-07-11 ENCOUNTER — Inpatient Hospital Stay: Payer: Medicare HMO | Attending: Hematology and Oncology | Admitting: Hematology and Oncology

## 2021-07-11 ENCOUNTER — Inpatient Hospital Stay: Payer: Medicare HMO

## 2021-07-11 ENCOUNTER — Encounter: Payer: Self-pay | Admitting: Hematology and Oncology

## 2021-07-11 VITALS — BP 115/101 | HR 78 | Temp 98.7°F | Resp 19 | Ht 71.0 in | Wt 214.0 lb

## 2021-07-11 DIAGNOSIS — I110 Hypertensive heart disease with heart failure: Secondary | ICD-10-CM | POA: Diagnosis not present

## 2021-07-11 DIAGNOSIS — E119 Type 2 diabetes mellitus without complications: Secondary | ICD-10-CM

## 2021-07-11 DIAGNOSIS — I10 Essential (primary) hypertension: Secondary | ICD-10-CM

## 2021-07-11 DIAGNOSIS — F17210 Nicotine dependence, cigarettes, uncomplicated: Secondary | ICD-10-CM

## 2021-07-11 DIAGNOSIS — D509 Iron deficiency anemia, unspecified: Secondary | ICD-10-CM | POA: Diagnosis present

## 2021-07-11 DIAGNOSIS — D500 Iron deficiency anemia secondary to blood loss (chronic): Secondary | ICD-10-CM | POA: Diagnosis not present

## 2021-07-11 NOTE — Progress Notes (Signed)
[place] CONSULT NOTE  Patient Care Team: Reubin Milan, MD as PCP - General (Internal Medicine) Larey Dresser, MD as PCP - Advanced Heart Failure (Cardiology) Larey Dresser, MD as PCP - Cardiology (Cardiology)  CHIEF COMPLAINTS/PURPOSE OF CONSULTATION:  IDA  ASSESSMENT & PLAN:   This is a very pleasant 60 year old male patient with past medical history significant for diabetes, hypertension, cardiomyopathy referred to hematology for evaluation of iron deficiency anemia.  Patient arrived to the appointment today with his wife.  Lasted in July, he was admitted with severe anemia, hemoglobin of 4.5 g/dL, needed multiple units of packed red blood cells.  Most recently he was also noticed to have a dropping hemoglobin and hence he received IV iron in July 2022.  His most recent labs show significant improvement in hemoglobin and iron panel.  He scheduled to have a colonoscopy in [place] on July 25, 2021.  His last colonoscopy was about 5 years ago.  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  There was also some mucosal irregularity in the fourth portion of duodenum and no biopsies were drawn at the time but there was some discussion if repeat enteroscopy should be considered on his most recent visit. I have encouraged him to keep his appointment for his colonoscopy, I would recommend considering a repeat enteroscopy to look at the duodenal irregularity as well as an upper GI endoscopy if clinically needed.  At this time his labs do not warrant any iron or blood transfusion.  No evidence of other nutritional deficiency, elevated CRP or hemolysis from July labs.  I have recommended follow-up in 6 weeks with repeat labs.  He was also asked to bring a copy of his colonoscopy report to his visit. We did discuss about the blood loss being the most common cause of iron deficiency in men and he  expressed understanding of all recommendations. Thank you for consulting [name] in the care of this patient.  Please not hesitate to contact us with any additional questions or concerns. I have also sent a message to the GI team for consideration of repeat endoscopy to better define the duodenal irregularity.    HISTORY OF PRESENTING ILLNESS:  Theodore Welch 60 y.o. male is here because of IDA  This is a very pleasant 60 year old male patient with a past medical history significant for diabetes mellitus, hypertension, CHF, LVAD in place referred to hematology for evaluation of persistent iron deficiency anemia.  In the past year Mr. Oma was admitted to the hospital with a hemoglobin of 4.5.  He required multiple units of packed red blood cells.  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  There was also a localized area of mucosal irregularity in the fourth portion of duodenum and the appearance of this area could be consistent with small adenoma versus telangiectasia versus benign granularity.   He is scheduled for a repeat colonoscopy on September 7 at the [place]. He most recently received IV iron on June 05, 2021.  He had repeat labs a couple weeks ago which showed improved hemoglobin at 12g/dl, iron panel and ferritin also showed significant improvement since iron infusion.  He denies any hematochezia or melena or change in bowel habits since last year. He feels great, energy levels are good, does get short of breath with exertion but is active  with daily activities.  He does have underlying ischemic cardiomyopathy with LVEF of 25 to 30% and has LVAD and is on chronic anticoagulation with Coumadin. Rest of the pertinent 10 point ROS reviewed and negative.  No family history of colon cancer.  REVIEW OF SYSTEMS:   Constitutional: Denies fevers, chills or abnormal night sweats Eyes: Denies blurriness of vision, double vision or  watery eyes Ears, nose, mouth, throat, and face: Denies mucositis or sore throat Respiratory: Denies cough, dyspnea or wheezes Cardiovascular: Denies palpitation, chest discomfort or lower extremity swelling Gastrointestinal:  Denies nausea, heartburn or change in bowel habits Skin: Denies abnormal skin rashes Lymphatics: Denies new lymphadenopathy or easy bruising Neurological:Denies numbness, tingling or new weaknesses Behavioral/Psych: Mood is stable, no new changes  All other systems were reviewed with the patient and are negative.  MEDICAL HISTORY:  Past Medical History:  Diagnosis Date   Abscess 01/2020   sternal abscess   Cardiomyopathy, unspecified ([place])    CHF (congestive heart failure) (HCC)    Chronic back pain    Diabetes mellitus without complication ([place])    Enlarged heart    Gastric ulcer    Gastroenteritis    H/O degenerative disc disease    Hypertension    LVAD (left ventricular assist device) present [facility])     SURGICAL HISTORY: Past Surgical History:  Procedure Laterality Date   APPLICATION OF A-CELL OF CHEST/ABDOMEN N/A 04/27/2020   Procedure: APPLICATION OF A-CELL OF CHEST;  Surgeon: Wallace Going, DO;  Location: [place];  Service: Plastics;  Laterality: N/A;   APPLICATION OF WOUND VAC N/A 12/03/2018   Procedure: APPLICATION OF WOUND VAC;  Surgeon: Prescott Gum, Collier Salina, MD;  Location: [place];  Service: Thoracic;  Laterality: N/A;   APPLICATION OF WOUND VAC N/A 12/09/2018   Procedure: Removal of Wound Vac, Application of ACELL;  Surgeon: Ivin Poot, MD;  Location: [place];  Service: Thoracic;  Laterality: N/A;   APPLICATION OF WOUND VAC N/A 01/26/2020   Procedure: APPLICATION OF WOUND VAC;  Surgeon: Ivin Poot, MD;  Location: [place];  Service: Thoracic;  Laterality: N/A;   APPLICATION OF WOUND VAC N/A 01/31/2020   Procedure: WOUND VAC CHANGE;  Surgeon: Ivin Poot, MD;  Location: [place];  Service: Thoracic;  Laterality: N/A;   APPLICATION OF WOUND  VAC N/A 04/18/2020   Procedure: Application Of Wound Vac;  Surgeon: Ivin Poot, MD;  Location: [place];  Service: Vascular;  Laterality: N/A;   APPLICATION OF WOUND VAC N/A 04/21/2020   Procedure: WOUND VAC CHANGE;  Surgeon: Ivin Poot, MD;  Location: [place];  Service: Thoracic;  Laterality: N/A;   APPLICATION OF WOUND VAC N/A 04/27/2020   Procedure: APPLICATION OF PREVENA INCISIONAL WOUND [place];  Surgeon: Wallace Going, DO;  Location: [place];  Service: Plastics;  Laterality: N/A;   ENTEROSCOPY N/A 06/12/2020   Procedure: ENTEROSCOPY;  Surgeon: Lavena Bullion, DO;  Location: [place];  Service: Gastroenterology;  Laterality: N/A;   HOT HEMOSTASIS N/A 06/12/2020   Procedure: HOT HEMOSTASIS (ARGON PLASMA COAGULATION/BICAP);  Surgeon: Lavena Bullion, DO;  Location: [facility] ENDOSCOPY;  Service: Gastroenterology;  Laterality: N/A;   INSERTION OF IMPLANTABLE LEFT VENTRICULAR ASSIST DEVICE N/A 08/03/2018   Procedure: INSERTION OF IMPLANTABLE LEFT VENTRICULAR ASSIST DEVICE - HM3;  Surgeon: Gaye Pollack, MD;  Location: [place];  Service: Open Heart Surgery;  Laterality: N/A;  HM3   LUMBAR LAMINECTOMY/DECOMPRESSION MICRODISCECTOMY     ORCHIECTOMY  PECTORALIS FLAP N/A 04/27/2020   Procedure: MUSCLE FLAP TO STERNUM;  Surgeon: Wallace Going, DO;  Location: [place];  Service: Plastics;  Laterality: N/A;   RIGHT HEART CATH N/A 07/24/2018   Procedure: RIGHT HEART CATH;  Surgeon: Larey Dresser, MD;  Location: [place] CV LAB;  Service: Cardiovascular;  Laterality: N/A;   STERNAL WOUND DEBRIDEMENT N/A 12/03/2018   Procedure: WOUND DEBRIDEMENT WITH A-CELL;  Surgeon: Ivin Poot, MD;  Location: [place];  Service: Thoracic;  Laterality: N/A;   STERNAL WOUND DEBRIDEMENT N/A 01/26/2020   Procedure: STERNAL WOUND & driveline IRRIGATION AND DEBRIDEMENT;  Surgeon: Ivin Poot, MD;  Location: [place];  Service: Thoracic;  Laterality: N/A;   STERNAL WOUND DEBRIDEMENT N/A 01/31/2020   Procedure:  STERNAL WOUND IRRIGATION;  Surgeon: Ivin Poot, MD;  Location: [place];  Service: Thoracic;  Laterality: N/A;  [place] N/A 02/04/2020   Procedure: STERNAL WOUND DEBRIDEMENT WITH WOUND VAC CHANGE;  Surgeon: Ivin Poot, MD;  Location: [place];  Service: Thoracic;  Laterality: N/A;   STERNAL WOUND DEBRIDEMENT N/A 04/27/2020   Procedure: STERNAL WOUND DEBRIDEMENT;  Surgeon: Ivin Poot, MD;  Location: [place];  Service: Thoracic;  Laterality: N/A;   TEE WITHOUT CARDIOVERSION N/A 08/03/2018   Procedure: TRANSESOPHAGEAL ECHOCARDIOGRAM (TEE);  Surgeon: Gaye Pollack, MD;  Location: [place];  Service: Open Heart Surgery;  Laterality: N/A;   WOUND DEBRIDEMENT  04/18/2020   Procedure: Debridement Abdominal Wound;  Surgeon: Ivin Poot, MD;  Location[facility] OR;  Service: Vascular;;   WOUND EXPLORATION N/A 04/18/2020   Procedure: REPAIR OF LVAD;  Surgeon: Ivin Poot, MD;  Location[facility];  Service: Vascular;  Laterality: N/A;    SOCIAL HISTORY: Social History   Socioeconomic History   Marital status: Single    Spouse name: Not on file   Number of children: 7   Years of education: 9th   Highest education level: 9th grade  Occupational History   Occupation: disabled  Tobacco Use   Smoking status: Every Day    Packs/day: 0.25    Types: Cigarettes    Start date: 2003   Smokeless tobacco: Never   Tobacco comments:    slowed down  Vaping Use   Vaping Use: Never used  Substance and Sexual Activity   Alcohol use: Not Currently   Drug use: Not Currently   Sexual activity: Not on file  Other Topics Concern   Not on file  Social History Narrative   Not on file   Social Determinants of Health   Financial Resource Strain: Not on file  Food Insecurity: Not on file  Transportation Needs: Not on file  Physical Activity: Not on file  Stress: Not on file  Social Connections: Not on file  Intimate Partner Violence: Not on file    FAMILY HISTORY: No  family history on file.  ALLERGIES:  is allergic to chlorhexidine and other.  MEDICATIONS:  Current Outpatient Medications  Medication Sig Dispense Refill   acetaminophen (TYLENOL) 500 MG tablet Take 1,000 mg by mouth every 6 (six) hours as needed for mild pain.     carbamide peroxide (DEBROX) 6.5 % OTIC solution Place 5 drops into both ears 3 (three) times daily. 15 mL 0   cephALEXin (KEFLEX) 500 MG capsule Take 1 capsule (500 mg total) by mouth 3 (three) times daily. 90 capsule 11   cyanocobalamin 500 MCG tablet Take 1,000 mcg by mouth daily.  docusate sodium (COLACE) 100 MG capsule Take 2 capsules (200 mg total) by mouth daily as needed for mild constipation. 10 capsule 0   fluticasone (FLONASE) 50 MCG/ACT nasal spray Place 1 spray into both nostrils 2 (two) times daily as needed for allergies. 9.9 mL 5   gabapentin (NEURONTIN) 300 MG capsule Take 2 capsules (600 mg total) by mouth 4 (four) times daily. 240 capsule 6   glucose blood (CONTOUR NEXT TEST) test strip Use as instructed 100 each 12   hydrALAZINE (APRESOLINE) 50 MG tablet Take 2 tablets (100 mg total) by mouth 3 (three) times daily. 90 tablet 6   hydrOXYzine (ATARAX/VISTARIL) 50 MG tablet Take 1 tablet (50 mg total) by mouth 3 (three) times daily as needed for itching. (Patient taking differently: Take 50 mg by mouth 2 (two) times daily as needed for itching.) 40 tablet 3   Ipratropium-Albuterol (COMBIVENT) 20-100 MCG/ACT AERS respimat Inhale 1 puff into the lungs every 6 (six) hours as needed for wheezing or shortness of breath. 4 g 5   Lancets (FREESTYLE) lancets Use as instructed 100 each 12   nystatin (MYCOSTATIN/NYSTOP) powder Apply 1 application topically 3 (three) times daily. 15 g 3   pantoprazole (PROTONIX) 40 MG tablet Take 1 tablet (40 mg total) by mouth daily. Take 40 mg twice daily for 8 weeks (2 months) and then back down to 40 mg daily (Patient taking differently: Take 40 mg by mouth daily.) 90 tablet 3    sacubitril-valsartan (ENTRESTO) 49-51 MG Take 1 tablet by mouth 2 (two) times daily. 60 tablet 11   sildenafil (VIAGRA) 100 MG tablet Take 0.5 tablets (50 mg total) by mouth daily as needed for erectile dysfunction. 10 tablet 6   spironolactone (ALDACTONE) 25 MG tablet Take 1 tablet (25 mg total) by mouth daily. 90 tablet 3   thiamine 100 MG tablet Take 1 tablet (100 mg total) by mouth daily. 30 tablet 0   warfarin (COUMADIN) 5 MG tablet Take 2.5 mg (1/2 tab) every Tuesday and 5 mg (1 tablet) all other days or as instructed by HF Clinic 100 tablet 3   Zinc Gluconate 100 MG TABS Take 1 tablet (100 mg total) by mouth daily. 90 tablet 3   zolpidem (AMBIEN) 5 MG tablet Take 1 tablet (5 mg total) by mouth at bedtime. 30 tablet 3   No current facility-administered medications for this visit.    PHYSICAL EXAMINATION: ECOG PERFORMANCE STATUS: 0 - Asymptomatic  Vitals:   07/11/21 1005  BP: (!) 115/101  Pulse: 78  Resp: 19  Temp: 98.7 F (37.1 C)  SpO2: 99%   Filed Weights   07/11/21 1005  Weight: 214 lb (97.1 kg)    GENERAL:alert, no distress and comfortable SKIN: skin color, texture, turgor are normal, no rashes or significant lesions EYES: normal, conjunctiva are pink and non-injected, sclera clear OROPHARYNX:no exudate, no erythema and lips, buccal mucosa, and tongue normal  NECK: supple, thyroid normal size, non-tender, without nodularity LYMPH:  no palpable lymphadenopathy in the cervical, axillary or inguinal LUNGS: clear to auscultation and percussion with normal breathing effort.  LVAD in place. HEART: regular rate & rhythm and no murmurs and no lower extremity edema ABDOMEN:abdomen soft, non-tender and normal bowel sounds Musculoskeletal:no cyanosis of digits and no clubbing  PSYCH: alert & oriented x 3 with fluent speech NEURO: no focal motor/sensory deficits  LABORATORY DATA:  I have reviewed the data as listed Lab Results  Component Value Date   WBC 9.0 06/25/2021  HGB 12.6 (L) 06/25/2021   HCT 40.0 06/25/2021   MCV 78.1 06/25/2021   PLT 240.0 06/25/2021     Chemistry      Component Value Date/Time   NA 139 06/25/2021 1404   K 3.8 06/25/2021 1404   CL 105 06/25/2021 1404   CO2 22 06/25/2021 1404   BUN 13 06/25/2021 1404   CREATININE 1.09 06/25/2021 1404      Component Value Date/Time   CALCIUM 9.6 06/25/2021 1404   ALKPHOS 111 03/27/2021 1032   AST 18 03/27/2021 1032   ALT 20 03/27/2021 1032   BILITOT 0.5 03/27/2021 1032     Have reviewed most recent labs without any concerns.  Labs back in July with no evidence of nutritional deficiency, hemolysis or elevated ESR or CRP.  RADIOGRAPHIC STUDIES: I have personally reviewed the radiological images as listed and agreed with the findings in the report. No results found.  All questions were answered. The patient knows to call the clinic with any problems, questions or concerns. I spent 30 minutes in the care of this patient including H and P, review of records, counseling and coordination of care.     Benay Pike, MD 07/11/2021 10:22 AM

## 2021-07-12 ENCOUNTER — Ambulatory Visit: Payer: Medicare HMO | Admitting: Physician Assistant

## 2021-07-16 ENCOUNTER — Other Ambulatory Visit (HOSPITAL_COMMUNITY): Payer: Self-pay | Admitting: *Deleted

## 2021-07-17 ENCOUNTER — Ambulatory Visit (HOSPITAL_COMMUNITY): Payer: Self-pay | Admitting: Pharmacist

## 2021-07-17 ENCOUNTER — Other Ambulatory Visit (HOSPITAL_COMMUNITY): Payer: Self-pay | Admitting: Unknown Physician Specialty

## 2021-07-17 ENCOUNTER — Ambulatory Visit (HOSPITAL_COMMUNITY)
Admission: RE | Admit: 2021-07-17 | Discharge: 2021-07-17 | Disposition: A | Discharge status: Home / Self Care | Payer: Medicare HMO | Source: Ambulatory Visit | Attending: Cardiology | Admitting: Cardiology

## 2021-07-17 DIAGNOSIS — Z95811 Presence of heart assist device: Secondary | ICD-10-CM | POA: Insufficient documentation

## 2021-07-17 DIAGNOSIS — K31819 Angiodysplasia of stomach and duodenum without bleeding: Secondary | ICD-10-CM | POA: Insufficient documentation

## 2021-07-17 LAB — POCT INR: INR: 2.2 (ref 2.0–3.0)

## 2021-07-17 MED ORDER — OCTREOTIDE ACETATE 20 MG IM KIT
20.0000 mg | PACK | INTRAMUSCULAR | Status: DC
  Administered 2021-07-17: 20 mg via INTRAMUSCULAR
  Filled 2021-07-17: qty 1

## 2021-07-17 MED ORDER — WARFARIN SODIUM 5 MG PO TABS: ORAL_TABLET | ORAL | 3 refills | Status: DC

## 2021-07-17 NOTE — Progress Notes (Signed)
LVAD INR 

## 2021-07-24 ENCOUNTER — Ambulatory Visit (HOSPITAL_COMMUNITY): Payer: Self-pay | Admitting: Pharmacist

## 2021-07-24 LAB — POCT INR: INR: 2 (ref 2.0–3.0)

## 2021-07-24 NOTE — Progress Notes (Signed)
LVAD INR 

## 2021-07-25 ENCOUNTER — Other Ambulatory Visit (HOSPITAL_COMMUNITY): Payer: Self-pay | Admitting: Cardiology

## 2021-07-25 DIAGNOSIS — I159 Secondary hypertension, unspecified: Secondary | ICD-10-CM

## 2021-07-25 DIAGNOSIS — Z95811 Presence of heart assist device: Secondary | ICD-10-CM

## 2021-07-25 DIAGNOSIS — I5022 Chronic systolic (congestive) heart failure: Secondary | ICD-10-CM

## 2021-07-27 ENCOUNTER — Other Ambulatory Visit (HOSPITAL_COMMUNITY): Payer: Self-pay | Admitting: *Deleted

## 2021-07-27 DIAGNOSIS — Z7901 Long term (current) use of anticoagulants: Secondary | ICD-10-CM

## 2021-07-27 DIAGNOSIS — Z95811 Presence of heart assist device: Secondary | ICD-10-CM

## 2021-07-30 ENCOUNTER — Other Ambulatory Visit: Payer: Self-pay

## 2021-07-30 ENCOUNTER — Encounter (HOSPITAL_COMMUNITY): Payer: Self-pay

## 2021-07-30 ENCOUNTER — Ambulatory Visit (HOSPITAL_COMMUNITY): Payer: Self-pay | Admitting: Pharmacist

## 2021-07-30 ENCOUNTER — Ambulatory Visit (HOSPITAL_COMMUNITY)
Admission: RE | Admit: 2021-07-30 | Discharge: 2021-07-30 | Disposition: A | Discharge status: Home / Self Care | Payer: Medicare HMO | Source: Ambulatory Visit | Attending: Cardiology | Admitting: Cardiology

## 2021-07-30 VITALS — BP 125/84 | HR 73 | Temp 98.1°F | Ht 71.0 in | Wt 209.2 lb

## 2021-07-30 DIAGNOSIS — K7030 Alcoholic cirrhosis of liver without ascites: Secondary | ICD-10-CM | POA: Diagnosis not present

## 2021-07-30 DIAGNOSIS — I159 Secondary hypertension, unspecified: Secondary | ICD-10-CM | POA: Diagnosis not present

## 2021-07-30 DIAGNOSIS — Z86718 Personal history of other venous thrombosis and embolism: Secondary | ICD-10-CM | POA: Diagnosis not present

## 2021-07-30 DIAGNOSIS — E785 Hyperlipidemia, unspecified: Secondary | ICD-10-CM | POA: Insufficient documentation

## 2021-07-30 DIAGNOSIS — I5022 Chronic systolic (congestive) heart failure: Secondary | ICD-10-CM | POA: Diagnosis not present

## 2021-07-30 DIAGNOSIS — Z95811 Presence of heart assist device: Secondary | ICD-10-CM

## 2021-07-30 DIAGNOSIS — Z7901 Long term (current) use of anticoagulants: Secondary | ICD-10-CM | POA: Diagnosis not present

## 2021-07-30 DIAGNOSIS — I428 Other cardiomyopathies: Secondary | ICD-10-CM | POA: Insufficient documentation

## 2021-07-30 DIAGNOSIS — Z79899 Other long term (current) drug therapy: Secondary | ICD-10-CM | POA: Diagnosis not present

## 2021-07-30 DIAGNOSIS — E119 Type 2 diabetes mellitus without complications: Secondary | ICD-10-CM | POA: Diagnosis not present

## 2021-07-30 DIAGNOSIS — L299 Pruritus, unspecified: Secondary | ICD-10-CM

## 2021-07-30 DIAGNOSIS — I420 Dilated cardiomyopathy: Secondary | ICD-10-CM | POA: Diagnosis not present

## 2021-07-30 DIAGNOSIS — G4733 Obstructive sleep apnea (adult) (pediatric): Secondary | ICD-10-CM | POA: Diagnosis not present

## 2021-07-30 DIAGNOSIS — F17210 Nicotine dependence, cigarettes, uncomplicated: Secondary | ICD-10-CM | POA: Diagnosis not present

## 2021-07-30 DIAGNOSIS — F17200 Nicotine dependence, unspecified, uncomplicated: Secondary | ICD-10-CM

## 2021-07-30 LAB — CBC
HCT: 41.1 % (ref 39.0–52.0)
Hemoglobin: 13.4 g/dL (ref 13.0–17.0)
MCH: 26.5 pg (ref 26.0–34.0)
MCHC: 32.6 g/dL (ref 30.0–36.0)
MCV: 81.4 fL (ref 80.0–100.0)
Platelets: 212 10*3/uL (ref 150–400)
RBC: 5.05 MIL/uL (ref 4.22–5.81)
RDW: 22 % — ABNORMAL HIGH (ref 11.5–15.5)
WBC: 7.6 10*3/uL (ref 4.0–10.5)
nRBC: 0 % (ref 0.0–0.2)

## 2021-07-30 LAB — COMPREHENSIVE METABOLIC PANEL
ALT: 23 U/L (ref 0–44)
AST: 23 U/L (ref 15–41)
Albumin: 4.1 g/dL (ref 3.5–5.0)
Alkaline Phosphatase: 105 U/L (ref 38–126)
Anion gap: 9 (ref 5–15)
BUN: 13 mg/dL (ref 8–23)
CO2: 20 mmol/L — ABNORMAL LOW (ref 22–32)
Calcium: 9.2 mg/dL (ref 8.9–10.3)
Chloride: 108 mmol/L (ref 98–111)
Creatinine, Ser: 1.11 mg/dL (ref 0.61–1.24)
GFR, Estimated: >60 mL/min (ref >60)
Glucose, Bld: 187 mg/dL — ABNORMAL HIGH (ref 70–99)
Potassium: 3.9 mmol/L (ref 3.5–5.1)
Sodium: 137 mmol/L (ref 135–145)
Total Bilirubin: 0.8 mg/dL (ref 0.3–1.2)
Total Protein: 6.7 g/dL (ref 6.5–8.1)

## 2021-07-30 LAB — PROTIME-INR
INR: 1.8 — ABNORMAL HIGH (ref 0.8–1.2)
Prothrombin Time: 20.9 seconds — ABNORMAL HIGH (ref 11.4–15.2)

## 2021-07-30 LAB — PREALBUMIN: Prealbumin: 24.7 mg/dL (ref 18–38)

## 2021-07-30 LAB — LACTATE DEHYDROGENASE: LDH: 213 U/L — ABNORMAL HIGH (ref 98–192)

## 2021-07-30 MED ORDER — SACUBITRIL-VALSARTAN 97-103 MG PO TABS
1.0000 | ORAL_TABLET | Freq: Two times a day (BID) | ORAL | 3 refills | Status: DC
Start: 2021-07-30 — End: 2022-05-27

## 2021-07-30 MED ORDER — HYDROXYZINE HCL 50 MG PO TABS: 50.0000 mg | ORAL_TABLET | Freq: Three times a day (TID) | ORAL | 3 refills | Status: DC | PRN

## 2021-07-30 MED ORDER — VARENICLINE TARTRATE 0.5 MG X 11 & 1 MG X 42 PO MISC: ORAL | 0 refills | Status: DC

## 2021-07-30 MED ORDER — HYDRALAZINE HCL 100 MG PO TABS: 100.0000 mg | ORAL_TABLET | Freq: Three times a day (TID) | ORAL | 3 refills | Status: DC

## 2021-07-30 NOTE — Patient Instructions (Addendum)
Start Chantix per directions on starting pak. If it is working for you, call [facility] office for next month supply. Increase Entresto to 97-103 twice daily for elevated BP. Refill for Hydralazine 100 mg three times daily sent (sent for 100 mg tabs in place of 50 mg tabs) Return to [facility] in 2 months.

## 2021-07-30 NOTE — Progress Notes (Addendum)
Patient presents for 2 mo f/u with 3 year Intermacs and annual maintenance in [facility] today with his wife. Reports no problems with VAD equipment or concerns with drive line.  Pt reports improved stamina and says he has no limitations with his daily activities.   Reports he is still smoking a few cigarettes a day. Encouraged complete cessation. Chantix Rx sent per Dr. [name].   Wife reports abdominal wounds have healed - see below. Remains on Keflex TID per ID.   BP elevated today, Entresto dose increased per Dr. [name].    Vital Signs:  Temp: 98.1 Doppler Pressure: 98 Automatc BP: 125/84 (105) HR: 73 SPO2: 99% RA   Weight: 209.2 lb w/o eqt Last weight: 220 lb  VAD Indication: Destination Therapy due to smoking status   VAD interrogation & Equipment Management: Speed: 5800 Flow:  4.6 Power:  4.6w    PI: 4.2 Hct: 32   Alarms: few low voltage advisories  Events: >80 PI events today Fixed speed: 5800 Low speed limit: 5500  Primary Controller:  Replace back up battery in 33 months. Back up controller:   Replace back up battery in 23 months.   Annual Equipment Maintenance on UBC/PM was performed on 07/30/21.  I reviewed the LVAD parameters from today and compared the results to the patient's prior recorded data. LVAD interrogation was NEGATIVE for significant power changes, NEGATIVE for clinical alarms and NEGATIVE for PI events/speed drops. No programming changes were made and pump is functioning within specified parameters. Pt is performing daily controller and system monitor self tests along with completing weekly and monthly maintenance for LVAD equipment.  LVAD equipment check completed and is in good working order. Back-up equipment present.   Abdominal incision care: Abdominal incision with Mepilex dressing.  Dressing removed. No drainage from open areas. Cleansed with betadine. Upper wound covered with band aid, lower wound left open. Wife will continue to clean and  change weekly or prn when visibly soiled.   Exit Site Care: Drive line is being maintained weekly by [employer]. Gauze dressing with anchor intact and accurately applied.  Existing dressing removed using sterile technique. Site cleansed with betadine swab x 2 and allowed to dry. Topped with dry 4 x 4. No drainage noted. No foul odor or tenderness. Anchor intact. Continue weekly dressing changes using the daily kit. Patient given 14 daily dressing kits, 25 anchors, sterile saline wipes, and betadine swabs.  Device: N/A   BP & Labs:  Doppler 98 - Doppler is reflecting MAP   Hgb 13.4 - No S/S of bleeding. Specifically denies BRBPR.    LDH stable at 213 with established baseline of 150 - 220. Denies tea-colored urine. No power elevations noted on interrogation.    Batteries Manufacture Date: Number of uses: Re-calibration  05/06/18 varies Performed by patient   Annual maintenance completed per [employer] on patient's mobile power unit and Electrical engineer.    Backup system controller 11 volt battery charged at home per patient.    3 year Intermacs follow up completed including:  Quality of Life, KCCQ-12, and Neurocognitive trail making.   Pt completed 1280 feet during 6 minute walk.  [facility] Cardiomyopathy Questionnaire  KCCQ-12 07/30/2021 01/16/2021 07/11/2020  1 a. Ability to shower/bathe Not at all limited Not at all limited Slightly limited  1 b. Ability to walk 1 block Not at all limited Slightly limited Moderately limited  1 c. Ability to hurry/jog Quite a bit limited Extremely limited Quite a bit limited  2. Edema feet/ankles/legs Never over the past 2 weeks Never over the past 2 weeks Never over the past 2 weeks  3. Limited by fatigue 1-2 times a week At least once a day Less than once a week  4. Limited by dyspnea Less than once a week At least once a day Less than once a week  5. Sitting up / on 3+ pillows Never over the past 2 weeks Never over the past 2 weeks Never over  the past 2 weeks  6. Limited enjoyment of life Slightly limited Moderately limited Slightly limited  7. Rest of life w/ symptoms Mostly satisfied Somewhat satisfied Mostly satisfied  8 a. Participation in hobbies Moderately limited Severely limited Moderately limited  8 b. Participation in chores Limited quite a bit Moderately limited Slightly limited  8 c. Visiting family/friends Slightly limited Slightly limited Slightly limited      Patient Instructions: Start Chantix per directions on starting pak. If it is working for you, call VAD office for next month supply. Increase Entresto to 97-103 twice daily for elevated BP. Refill for Hydralazine 100 mg three times daily sent (sent for 100 mg tabs in place of 50 mg tabs) Return to [facility] in 2 months.   Zada Girt RN VAD Coordinator  Office: [phone]  24/7 Pager: [phone]   60 y.o. with history of nonischemic cardiomyopathy, RLE DVT, cirrhosis, smoking, and OSA returns for followup of CHF/LVAD placement.  Cardiomyopathy was diagnosed in 6/19 in [place], [place] at that time showed low output.  He was admitted to [facility] in 9/19 with low output HF and was started on milrinone and diuresed.  Unable to wean off milrinone.  He had a degree of RV failure, but this improved on milrinone.  Valvular heart disease also looked better with milrinone and diuresis.  On 08/03/18, he had Heartmate 3 LVAD placed.  Speed was optimized by ramp echo post-op.  Post-op course was relatively unremarkable.  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  No abscess on CT.  He was started on cefazolin IV for 6 wks.  BP-active meds decreased with low MAP.   He was admitted in 3/21 with subxiphoid abscess and cellulitis.  CT showed collection along the course of the driveline.  There was no evidence for sternal osteomyelitis.  Abscess was debrided, MSSA grew from wound cultures.   Wound vac was placed.  Patient was started on cefazolin, which was recently stopped after completing 8 wks.   Patient was re-admitted later in 3/21 with facial Zoster.  He was treated with acyclovir and this has resolved.   He was admitted in 6/21 with bleeding from the site of the subxiphoid abscess as well as cellulitis.  He required multiple units of PRBCs.  He went to the OR initially for I&D.  Blood and wound cultures grew MSSA.  He then went back to the OR for rectus flap over wound site (plastics).  He was started on cefazolin and rifabutin, has PICC.   He was admitted in 7/21 with GI bleeding, he had 6 units PRBCs.  EGD showed duodenal AVMs, treated with APC.  INR goal with warfarin lowered to 1.8-2.3.   Echo in 5/22 was a difficult study with EF 25-30%, mild LVH, aortic valve difficult to visualize but appears to open with each beat, mildly dilated RV with moderately decreased systolic function.   He returns for LVAD followup.  Still smoking, unable to tolerate Wellbutrin (felt like a "zombie").  Driveline site has healed though remains on prophylactic Keflex.  MAP mildly elevated at 105 today.  Weight down 11 lbs, eating better. No exertional dyspnea.  No lightheadedness. He was in his brother's wedding this weekend.  LVAD parameters except for more frequent PI events.   Denies LVAD alarms.  Reports taking Coumadin as prescribed and adherence to anticoagulation based dietary restrictions.  Denies bright red blood per rectum or melena, no dark urine or hematuria.    Labs (9/19): LDH 210, INR 2.17, WBCs 17.1 => 15, hgb 9.7, creatinine 0.72 Labs (10/19): K 4.3, creatinine 0.78 => 0.83, hgb 10.2 Labs (11/19): creatinine 0.79, hgb 10 Labs (2/20): K 3.9, creatinine 0.79 Labs (4/20): K 4, creatinine 1.15, hgb 11.4, LDH 199, INR 1.9 Labs (5/20): hgb 10.4, WBCs 8.5 Labs (6/20): K 4.2, creatinine 1.06, hgb 11.2 Labs (8/20): k 3.7, creatinine 1.0, hgb 11.5, LDH 186 Labs (10/20): K 3.5,  creatinine 0.93, INR 2, LDH 221 Labs (12/20): hgb 15.6, K 3.7, creatinine 1.02, LDH 250 Labs (4/21): K 4.1, creatinine 0.86, hgb 11.1 Labs (6/21): K 4.1, creatinine 0.76, hgb 10, WBCs 11.2 Labs (8/21): hgb 10.6 Labs (10/21): hgb 13.2, creatinine 1.17 Labs (7/22): hgb 8, LDH 372, INR 1.9, creatinine 0.95 Labs (8/22): hgb 12.6  PMH: 1. Degenerative disc disease.  2. GERD 3. Chronic systolic CHF:  Nonischemic cardiomyopathy.  Dilated cardiomyopathy diagnosed 6/19 in [place].  LHC/RHC in 7/19 showed elevated filling pressures, low cardiac output, and no significant CAD.   - Echo (9/19): Severe LV dilation with EF 10-20%, moderate-severe MR, severely dilated RV with mildly decreased systolic function, severe TR.  - Cardiac MRI (9/19): EF 14%, moderate dilated LV, severely dilated RV with mod-severe systolic function and EF 38%, nonspecific RV insertion site LGE.  - RHC (5/19) on milrinone 0.375: mean RA 8, PA 40/19, mean PCWP 12, PAPi 2.65, CI 2.71.  - Echo (9/19) on milrinone and diuresed): EF 20-25% with moderate LV dilation, moderately dilated RV with mildly decreased systolic function, mild-moderate MR, moderate TR, PASP 51 mmHg.  Cannot rule out noncompaction.  - Heartmate 3 LVAD placement in 9/19.  - Echo (5/22): EF 25-30%, mild LVH, aortic valve difficult to visualize but appears to open with each beat, mildly dilated RV with moderately decreased systolic function. 4. OSA: CPAP use.  5. Prior smoker 6. Type 2 diabetes 7. Hyperlipidemia.  8. H/o NSVT 9. 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  10. RLE DVT: found in 9/19.  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 Subxiphoid abscess involving collection along driveline in 9/37, MSSA.  6/21 Rectus flap to cover abscess site.  12. Facial Zoster 3/21 13. 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   Current Outpatient Medications  Medication Sig Dispense Refill    acetaminophen (TYLENOL) 500 MG tablet Take 1,000 mg by mouth every 6 (six) hours as needed for mild pain.     carbamide peroxide (DEBROX) 6.5 % OTIC solution Place 5 drops into both ears 3 (three) times daily. 15 mL 0   cephALEXin (KEFLEX) 500 MG capsule Take 1 capsule (500 mg total) by mouth 3 (three) times daily. 90 capsule 11   cyanocobalamin 500 MCG tablet Take 1,000 mcg by mouth daily.      docusate sodium (COLACE) 100 MG capsule Take 2 capsules (200 mg total) by mouth daily as needed for mild constipation. 10 capsule 0  fluticasone (FLONASE) 50 MCG/ACT nasal spray Place 1 spray into both nostrils 2 (two) times daily as needed for allergies. 9.9 mL 5   Ipratropium-Albuterol (COMBIVENT) 20-100 MCG/ACT AERS respimat Inhale 1 puff into the lungs every 6 (six) hours as needed for wheezing or shortness of breath. 4 g 5   nystatin (MYCOSTATIN/NYSTOP) powder Apply 1 application topically 3 (three) times daily. 15 g 3   pantoprazole (PROTONIX) 40 MG tablet Take 1 tablet (40 mg total) by mouth daily. Take 40 mg twice daily for 8 weeks (2 months) and then back down to 40 mg daily (Patient taking differently: Take 40 mg by mouth daily.) 90 tablet 3   sacubitril-valsartan (ENTRESTO) 97-103 MG Take 1 tablet by mouth 2 (two) times daily. 180 tablet 3   sildenafil (VIAGRA) 100 MG tablet Take 0.5 tablets (50 mg total) by mouth daily as needed for erectile dysfunction. 10 tablet 6   spironolactone (ALDACTONE) 25 MG tablet Take 1 tablet (25 mg total) by mouth daily. 90 tablet 3   thiamine 100 MG tablet Take 1 tablet (100 mg total) by mouth daily. 30 tablet 0   varenicline (CHANTIX STARTING MONTH PAK) 0.5 MG X 11 & 1 MG X 42 tablet Take one 0.5 mg tablet by mouth once daily for 3 days, then increase to one 0.5 mg tablet twice daily for 4 days, then increase to one 1 mg tablet twice daily. 53 tablet 0   Zinc Gluconate 100 MG TABS Take 1 tablet (100 mg total) by mouth daily. 90 tablet 3   zolpidem (AMBIEN) 5 MG  tablet Take 1 tablet (5 mg total) by mouth at bedtime. 30 tablet 3   gabapentin (NEURONTIN) 300 MG capsule Take 2 capsules (600 mg total) by mouth 4 (four) times daily. 240 capsule 6   hydrALAZINE (APRESOLINE) 100 MG tablet Take 1 tablet (100 mg total) by mouth 3 (three) times daily. 90 tablet 3   hydrOXYzine (ATARAX/VISTARIL) 50 MG tablet Take 1 tablet (50 mg total) by mouth 3 (three) times daily as needed for itching. 60 tablet 3   warfarin (COUMADIN) 5 MG tablet Take 2.5 mg (1/2 tab) every Tuesday and 5 mg (1 tablet) all other days or as instructed by [facility] (Patient not taking: Reported on 07/30/2021) 100 tablet 3   No current facility-administered medications for this encounter.    Chlorhexidine and Other  REVIEW OF SYSTEMS: All systems negative except as listed in HPI, PMH and Problem list.   LVAD INTERROGATION:  Please see LVAD nurse's note above for details.    I reviewed the LVAD parameters from today, and compared the results to the patient's prior recorded data.  No programming changes were made.  The LVAD is functioning within specified parameters.  The patient performs LVAD self-test daily.  LVAD interrogation was negative for any significant power changes, alarms or PI events/speed drops.  LVAD equipment check completed and is in good working order.  Back-up equipment present.   LVAD education done on emergency procedures and precautions and reviewed exit site care.   MAP 105  Physical Exam: General: Well appearing this am. NAD.  HEENT: Normal. Neck: Supple, JVP 7-8 cm. Carotids OK.  Cardiac:  Mechanical heart sounds with LVAD hum present.  Lungs:  CTAB, normal effort.  Abdomen:  NT, ND, no HSM. No bruits or masses. +BS  LVAD exit site: Well-healed and incorporated. Dressing dry and intact. No erythema or drainage. Stabilization device present and accurately applied. Driveline dressing changed daily per  sterile technique. Extremities:  Warm and dry. No cyanosis,  clubbing, rash, or edema.  Neuro:  Alert & oriented x 3. Cranial nerves grossly intact. Moves all 4 extremities w/o difficulty. Affect pleasant      ASSESSMENT AND PLAN:   1. 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  LVAD parameters stable.  MAP elevated.  NYHA class II.   He does not look volume overloaded. I suspect increased PI events are related to HTN.   - He does not need Lasix.      - Continue spironolactone 25 mg daily. BMET today.  - Increase Entresto to 97/103 bid.   - Continue hydralazine 100 mg tid.  - He is off ASA with GI bleeding.  - Continue warfarin for INR 1.8-2.3.    - Should be transplant candidate eventually if he can quit smoking.  Will make transplant clinic referral when he is off totally.  2. Smoking: He has cut back a lot but still smoking.  Failed Wellbutrin, will have him try Chantix.  3. RLE DVT: On warfarin for LVAD.  4. OSA: Continue CPAP.  5. Hyperlipidemia: Atorvastatin.  6. Type II diabetes: Per PCP.  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  Site improved.  - He will have long-term Keflex for MSSA suppression.  Followup with ID.  8. 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 No overt bleeding, most recent hgb 12.6.   - Off ASA, INR goal now 1.8-2.3.  - Continue monthly octreotide injections.  - CBC today.   Followup in 2 months.    Loralie Champagne 07/30/2021

## 2021-07-30 NOTE — Progress Notes (Signed)
LVAD INR 

## 2021-08-06 ENCOUNTER — Ambulatory Visit (HOSPITAL_COMMUNITY): Payer: Self-pay | Admitting: Pharmacist

## 2021-08-06 LAB — POCT INR: INR: 2 (ref 2.0–3.0)

## 2021-08-06 NOTE — Progress Notes (Signed)
LVAD INR 

## 2021-08-13 ENCOUNTER — Ambulatory Visit (HOSPITAL_COMMUNITY): Payer: Self-pay | Admitting: Pharmacist

## 2021-08-13 LAB — POCT INR: INR: 1.9 — AB (ref 2.0–3.0)

## 2021-08-13 NOTE — Progress Notes (Signed)
LVAD INR 

## 2021-08-14 ENCOUNTER — Other Ambulatory Visit: Payer: Self-pay

## 2021-08-14 ENCOUNTER — Encounter (HOSPITAL_COMMUNITY)
Admission: RE | Admit: 2021-08-14 | Discharge: 2021-08-14 | Disposition: A | Discharge status: Home / Self Care | Payer: Medicare HMO | Source: Ambulatory Visit | Attending: Cardiology | Admitting: Cardiology

## 2021-08-14 DIAGNOSIS — Z95811 Presence of heart assist device: Secondary | ICD-10-CM | POA: Diagnosis present

## 2021-08-14 DIAGNOSIS — K31819 Angiodysplasia of stomach and duodenum without bleeding: Secondary | ICD-10-CM | POA: Diagnosis present

## 2021-08-14 MED ORDER — OCTREOTIDE ACETATE 20 MG IM KIT
20.0000 mg | PACK | INTRAMUSCULAR | Status: DC
  Administered 2021-08-14: 20 mg via INTRAMUSCULAR
  Filled 2021-08-14: qty 1

## 2021-08-21 ENCOUNTER — Ambulatory Visit (HOSPITAL_COMMUNITY): Payer: Self-pay | Admitting: Pharmacist

## 2021-08-21 LAB — POCT INR: INR: 2.1 (ref 2.0–3.0)

## 2021-08-21 NOTE — Progress Notes (Signed)
LVAD INR 

## 2021-08-23 ENCOUNTER — Inpatient Hospital Stay: Payer: Medicare HMO | Admitting: Hematology and Oncology

## 2021-08-23 ENCOUNTER — Inpatient Hospital Stay: Payer: Medicare HMO

## 2021-08-28 ENCOUNTER — Ambulatory Visit (HOSPITAL_COMMUNITY): Payer: Self-pay | Admitting: Pharmacist

## 2021-08-28 LAB — POCT INR: INR: 2 (ref 2.0–3.0)

## 2021-08-28 NOTE — Progress Notes (Signed)
LVAD INR 

## 2021-09-03 ENCOUNTER — Ambulatory Visit (HOSPITAL_COMMUNITY): Payer: Self-pay | Admitting: Pharmacist

## 2021-09-03 LAB — POCT INR: INR: 1.9 — AB (ref 2.0–3.0)

## 2021-09-03 NOTE — Progress Notes (Signed)
LVAD INR 

## 2021-09-10 ENCOUNTER — Ambulatory Visit (HOSPITAL_COMMUNITY): Payer: Self-pay | Admitting: Pharmacist

## 2021-09-10 LAB — POCT INR: INR: 2.1 (ref 2.0–3.0)

## 2021-09-10 NOTE — Progress Notes (Signed)
LVAD INR 

## 2021-09-11 ENCOUNTER — Encounter (HOSPITAL_COMMUNITY)
Admission: RE | Admit: 2021-09-11 | Discharge: 2021-09-11 | Disposition: A | Discharge status: Home / Self Care | Payer: Medicare HMO | Source: Ambulatory Visit | Attending: Cardiology | Admitting: Cardiology

## 2021-09-11 DIAGNOSIS — K31819 Angiodysplasia of stomach and duodenum without bleeding: Secondary | ICD-10-CM | POA: Diagnosis not present

## 2021-09-11 DIAGNOSIS — Z95811 Presence of heart assist device: Secondary | ICD-10-CM | POA: Insufficient documentation

## 2021-09-11 MED ORDER — OCTREOTIDE ACETATE 20 MG IM KIT
20.0000 mg | PACK | INTRAMUSCULAR | Status: DC
  Administered 2021-09-11: 20 mg via INTRAMUSCULAR
  Filled 2021-09-11: qty 1

## 2021-09-17 ENCOUNTER — Ambulatory Visit (HOSPITAL_COMMUNITY): Payer: Self-pay | Admitting: Pharmacist

## 2021-09-17 ENCOUNTER — Encounter (HOSPITAL_COMMUNITY): Payer: Medicare HMO

## 2021-09-17 LAB — POCT INR: INR: 2 (ref 2.0–3.0)

## 2021-09-17 NOTE — Progress Notes (Signed)
LVAD INR 

## 2021-09-24 ENCOUNTER — Ambulatory Visit (HOSPITAL_COMMUNITY): Payer: Self-pay | Admitting: Pharmacist

## 2021-09-24 LAB — POCT INR: INR: 1.9 — AB (ref 2.0–3.0)

## 2021-09-24 NOTE — Progress Notes (Signed)
LVAD INR 

## 2021-09-27 ENCOUNTER — Other Ambulatory Visit (HOSPITAL_COMMUNITY): Payer: Self-pay | Admitting: *Deleted

## 2021-09-27 DIAGNOSIS — Z7901 Long term (current) use of anticoagulants: Secondary | ICD-10-CM

## 2021-09-27 DIAGNOSIS — Z95811 Presence of heart assist device: Secondary | ICD-10-CM

## 2021-09-27 DIAGNOSIS — I5022 Chronic systolic (congestive) heart failure: Secondary | ICD-10-CM

## 2021-10-01 ENCOUNTER — Encounter (HOSPITAL_COMMUNITY): Payer: Self-pay | Admitting: *Deleted

## 2021-10-01 ENCOUNTER — Encounter (HOSPITAL_COMMUNITY): Payer: Self-pay

## 2021-10-01 ENCOUNTER — Ambulatory Visit (HOSPITAL_COMMUNITY)
Admission: RE | Admit: 2021-10-01 | Discharge: 2021-10-01 | Disposition: A | Discharge status: Home / Self Care | Payer: Medicare HMO | Source: Ambulatory Visit | Attending: Family Medicine | Admitting: Family Medicine

## 2021-10-01 ENCOUNTER — Ambulatory Visit (HOSPITAL_COMMUNITY): Payer: Self-pay | Admitting: Pharmacist

## 2021-10-01 VITALS — BP 130/100 | HR 67 | Temp 97.8°F | Ht 71.0 in | Wt 219.8 lb

## 2021-10-01 DIAGNOSIS — F17210 Nicotine dependence, cigarettes, uncomplicated: Secondary | ICD-10-CM | POA: Insufficient documentation

## 2021-10-01 DIAGNOSIS — I420 Dilated cardiomyopathy: Secondary | ICD-10-CM | POA: Insufficient documentation

## 2021-10-01 DIAGNOSIS — K7030 Alcoholic cirrhosis of liver without ascites: Secondary | ICD-10-CM | POA: Insufficient documentation

## 2021-10-01 DIAGNOSIS — E119 Type 2 diabetes mellitus without complications: Secondary | ICD-10-CM | POA: Diagnosis not present

## 2021-10-01 DIAGNOSIS — I5022 Chronic systolic (congestive) heart failure: Secondary | ICD-10-CM | POA: Insufficient documentation

## 2021-10-01 DIAGNOSIS — M79651 Pain in right thigh: Secondary | ICD-10-CM | POA: Diagnosis not present

## 2021-10-01 DIAGNOSIS — Z9989 Dependence on other enabling machines and devices: Secondary | ICD-10-CM | POA: Insufficient documentation

## 2021-10-01 DIAGNOSIS — M797 Fibromyalgia: Secondary | ICD-10-CM

## 2021-10-01 DIAGNOSIS — Z86718 Personal history of other venous thrombosis and embolism: Secondary | ICD-10-CM | POA: Insufficient documentation

## 2021-10-01 DIAGNOSIS — Z95811 Presence of heart assist device: Secondary | ICD-10-CM | POA: Diagnosis not present

## 2021-10-01 DIAGNOSIS — K219 Gastro-esophageal reflux disease without esophagitis: Secondary | ICD-10-CM | POA: Diagnosis not present

## 2021-10-01 DIAGNOSIS — Z7901 Long term (current) use of anticoagulants: Secondary | ICD-10-CM | POA: Diagnosis not present

## 2021-10-01 DIAGNOSIS — G4733 Obstructive sleep apnea (adult) (pediatric): Secondary | ICD-10-CM | POA: Diagnosis not present

## 2021-10-01 DIAGNOSIS — I428 Other cardiomyopathies: Secondary | ICD-10-CM | POA: Diagnosis not present

## 2021-10-01 DIAGNOSIS — I10 Essential (primary) hypertension: Secondary | ICD-10-CM

## 2021-10-01 DIAGNOSIS — Z79899 Other long term (current) drug therapy: Secondary | ICD-10-CM | POA: Insufficient documentation

## 2021-10-01 DIAGNOSIS — E785 Hyperlipidemia, unspecified: Secondary | ICD-10-CM | POA: Diagnosis not present

## 2021-10-01 LAB — POCT INR: INR: 2 (ref 2.0–3.0)

## 2021-10-01 LAB — BASIC METABOLIC PANEL
Anion gap: 9 (ref 5–15)
BUN: 8 mg/dL (ref 8–23)
CO2: 24 mmol/L (ref 22–32)
Calcium: 8.7 mg/dL — ABNORMAL LOW (ref 8.9–10.3)
Chloride: 104 mmol/L (ref 98–111)
Creatinine, Ser: 0.99 mg/dL (ref 0.61–1.24)
GFR, Estimated: >60 mL/min (ref >60)
Glucose, Bld: 194 mg/dL — ABNORMAL HIGH (ref 70–99)
Potassium: 3.7 mmol/L (ref 3.5–5.1)
Sodium: 137 mmol/L (ref 135–145)

## 2021-10-01 LAB — CBC
HCT: 39.9 % (ref 39.0–52.0)
Hemoglobin: 12.6 g/dL — ABNORMAL LOW (ref 13.0–17.0)
MCH: 27 pg (ref 26.0–34.0)
MCHC: 31.6 g/dL (ref 30.0–36.0)
MCV: 85.6 fL (ref 80.0–100.0)
Platelets: 215 10*3/uL (ref 150–400)
RBC: 4.66 MIL/uL (ref 4.22–5.81)
RDW: 14.8 % (ref 11.5–15.5)
WBC: 8 10*3/uL (ref 4.0–10.5)
nRBC: 0 % (ref 0.0–0.2)

## 2021-10-01 LAB — LACTATE DEHYDROGENASE: LDH: 205 U/L — ABNORMAL HIGH (ref 98–192)

## 2021-10-01 LAB — PROTIME-INR
INR: 2 — ABNORMAL HIGH (ref 0.8–1.2)
Prothrombin Time: 22.9 seconds — ABNORMAL HIGH (ref 11.4–15.2)

## 2021-10-01 MED ORDER — GABAPENTIN 300 MG PO CAPS
600.0000 mg | ORAL_CAPSULE | Freq: Four times a day (QID) | ORAL | 5 refills | Status: DC
Start: 2021-10-01 — End: 2021-10-01

## 2021-10-01 MED ORDER — GABAPENTIN 300 MG PO CAPS: 600.0000 mg | ORAL_CAPSULE | Freq: Four times a day (QID) | ORAL | 5 refills | Status: DC

## 2021-10-01 MED ORDER — CARVEDILOL 6.25 MG PO TABS: 6.2500 mg | ORAL_TABLET | Freq: Two times a day (BID) | ORAL | 3 refills | Status: DC

## 2021-10-01 MED ORDER — SPIRONOLACTONE 25 MG PO TABS: 50.0000 mg | ORAL_TABLET | Freq: Every day | ORAL | 3 refills | Status: DC

## 2021-10-01 MED ORDER — VARENICLINE TARTRATE 1 MG PO TABS: 1.0000 mg | ORAL_TABLET | Freq: Two times a day (BID) | ORAL | 3 refills | Status: DC

## 2021-10-01 NOTE — Progress Notes (Addendum)
Patient presents for 2 mo f/u in [facility] today with his wife. Reports no problems with VAD equipment or concerns with drive line.  Pt reports pain in right upper thigh, discussed with Dr. [name]. He will start walking more and cut snacks back, feels it might be from recent weight gain.   Reports he is still smoking a few cigarettes a day. He started Chantix as instructed last visit; he is now taking 1 mg bid; refill requested - sent per Dr. [name].   Wife reports abdominal wounds have healed - see below. Remains on Keflex TID per ID.   BP elevated today, Dr. [name] increased Arlyce Harman to 50 mg daily and added Coreg 6.25 mg bid. Rx sent and written instructions provided to patient. He will need BMP in 2 weeks, lab appt made with verbalized understanding of patient and wife.   Gabapentin Rx expired; new Rx printed, pt requested we call the refill. Refill called to local pharmacy.   Pt is requesting [place] information near [facility], [place] due to upcoming trip. Will research and send info to patient/wife.    Vital Signs: Temp: 97.8 Doppler Pressure: 126 Automatc BP: 130/100 (109) HR: 67 SPO2: 98% RA   Weight: 219.8 lb w/o eqt Last weight: 209 lb  VAD Indication: Destination Therapy due to smoking status   VAD interrogation & Equipment Management: Speed: 5800 Flow:  3.9 Power:  4.6w    PI:  7.4 Hct: 32   Alarms: few low voltage advisories  Events: 20 - 30 PI events daily Fixed speed: 5800 Low speed limit: 5500  Primary Controller:  Replace back up battery in 31 months. Back up controller:   Replace back up battery in 21 months.   Annual Equipment Maintenance on UBC/PM was performed on 07/30/21.  I reviewed the LVAD parameters from today and compared the results to the patient's prior recorded data. LVAD interrogation was NEGATIVE for significant power changes, NEGATIVE for clinical alarms and NEGATIVE for PI events/speed drops. No programming changes were made and pump is  functioning within specified parameters. Pt is performing daily controller and system monitor self tests along with completing weekly and monthly maintenance for LVAD equipment.  LVAD equipment check completed and is in good working order. Back-up equipment present.   Abdominal incision care: Band aid intact to upper wound; changed prn per [name]. Lower wound left open with no drainage reported.    Exit Site Care: Drive line is being maintained weekly by [employer]. Gauze dressing with anchor intact and accurately applied. Continue weekly dressing changes using the daily kit. Patient given 14 daily dressing kits, 10 anchors, sterile saline wipes, betadine swabs, adhesive removers, large tegaderm dressings, and box of band aids.   Device: N/A   BP & Labs:  Doppler 126- Doppler is reflecting modified systolic BP   Hgb 96.7 - No S/S of bleeding. Specifically denies BRBPR.    LDH stable at 205 with established baseline of 150 - 220. Denies tea-colored urine. No power elevations noted on interrogation.    Patient Instructions: Decrease snacking Increase walking Increase Spiro to 50 mg daily Start Coreg 6.25 mg twice daily  Lab in 10 days Return to [place] [facility] in 6 weeks Lauren will call you with INR results and warfarin dosing  Zada Girt RN VAD Coordinator  Office: [phone]  24/7 Pager: [phone]   60 y.o. with history of nonischemic cardiomyopathy, RLE DVT, cirrhosis, smoking, and OSA returns for followup of CHF/LVAD placement.  Cardiomyopathy was diagnosed  in 6/19 in [place], [place] at that time showed low output.  He was admitted to [facility] in 9/19 with low output HF and was started on milrinone and diuresed.  Unable to wean off milrinone.  He had a degree of RV failure, but this improved on milrinone.  Valvular heart disease also looked better with milrinone and diuresis.  On 08/03/18, he had Heartmate 3 LVAD placed.  Speed was optimized by ramp echo post-op.  Post-op course was  relatively unremarkable.  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  No abscess on CT.  He was started on cefazolin IV for 6 wks.  BP-active meds decreased with low MAP.   He was admitted in 3/21 with subxiphoid abscess and cellulitis.  CT showed collection along the course of the driveline.  There was no evidence for sternal osteomyelitis.  Abscess was debrided, MSSA grew from wound cultures.  Wound vac was placed.  Patient was started on cefazolin, which was recently stopped after completing 8 wks.   Patient was re-admitted later in 3/21 with facial Zoster.  He was treated with acyclovir and this has resolved.   He was admitted in 6/21 with bleeding from the site of the subxiphoid abscess as well as cellulitis.  He required multiple units of PRBCs.  He went to the OR initially for I&D.  Blood and wound cultures grew MSSA.  He then went back to the OR for rectus flap over wound site (plastics).  He was started on cefazolin and rifabutin, has PICC.   He was admitted in 7/21 with GI bleeding, he had 6 units PRBCs.  EGD showed duodenal AVMs, treated with APC.  INR goal with warfarin lowered to 1.8-2.3.   Echo in 5/22 was a difficult study with EF 25-30%, mild LVH, aortic valve difficult to visualize but appears to open with each beat, mildly dilated RV with moderately decreased systolic function.   He returns for LVAD followup.   Still smoking but has cut back on Chantix.  No driveline drainage.  No BRBPR/melena.  Walking more, denies dyspnea walking on flat ground or doing work around the house.  Weight elevated, has been snacking a lot. MAP elevated.   Denies LVAD alarms.  Reports taking Coumadin as prescribed and adherence to anticoagulation based dietary restrictions.  Denies bright red blood per rectum or melena, no dark urine or hematuria.    Labs (9/19): LDH 210, INR 2.17, WBCs 17.1 => 15, hgb 9.7, creatinine  0.72 Labs (10/19): K 4.3, creatinine 0.78 => 0.83, hgb 10.2 Labs (11/19): creatinine 0.79, hgb 10 Labs (2/20): K 3.9, creatinine 0.79 Labs (4/20): K 4, creatinine 1.15, hgb 11.4, LDH 199, INR 1.9 Labs (5/20): hgb 10.4, WBCs 8.5 Labs (6/20): K 4.2, creatinine 1.06, hgb 11.2 Labs (8/20): k 3.7, creatinine 1.0, hgb 11.5, LDH 186 Labs (10/20): K 3.5, creatinine 0.93, INR 2, LDH 221 Labs (12/20): hgb 15.6, K 3.7, creatinine 1.02, LDH 250 Labs (4/21): K 4.1, creatinine 0.86, hgb 11.1 Labs (6/21): K 4.1, creatinine 0.76, hgb 10, WBCs 11.2 Labs (8/21): hgb 10.6 Labs (10/21): hgb 13.2, creatinine 1.17 Labs (7/22): hgb 8, LDH 372, INR 1.9, creatinine 0.95 Labs (8/22): hgb 12.6 Labs (9/22): K 3.9, creatinine 1.1  PMH: 1. Degenerative disc disease.  2. GERD 3. Chronic systolic CHF:  Nonischemic cardiomyopathy.  Dilated cardiomyopathy diagnosed 6/19 in [place].  LHC/RHC in 7/19 showed elevated filling pressures, low cardiac output,  and no significant CAD.   - Echo (9/19): Severe LV dilation with EF 10-20%, moderate-severe MR, severely dilated RV with mildly decreased systolic function, severe TR.  - Cardiac MRI (9/19): EF 14%, moderate dilated LV, severely dilated RV with mod-severe systolic function and EF 56%, nonspecific RV insertion site LGE.  - RHC (5/19) on milrinone 0.375: mean RA 8, PA 40/19, mean PCWP 12, PAPi 2.65, CI 2.71.  - Echo (9/19) on milrinone and diuresed): EF 20-25% with moderate LV dilation, moderately dilated RV with mildly decreased systolic function, mild-moderate MR, moderate TR, PASP 51 mmHg.  Cannot rule out noncompaction.  - Heartmate 3 LVAD placement in 9/19.  - Echo (5/22): EF 25-30%, mild LVH, aortic valve difficult to visualize but appears to open with each beat, mildly dilated RV with moderately decreased systolic function. 4. OSA: CPAP use.  5. Prior smoker 6. Type 2 diabetes 7. Hyperlipidemia.  8. H/o NSVT 9. 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  10. RLE DVT: found in 9/19.  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 Subxiphoid abscess involving collection along driveline in 3/89, MSSA.  6/21 Rectus flap to cover abscess site.  12. Facial Zoster 3/21 13. 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   Current Outpatient Medications  Medication Sig Dispense Refill   acetaminophen (TYLENOL) 500 MG tablet Take 1,000 mg by mouth every 6 (six) hours as needed for mild pain.     carbamide peroxide (DEBROX) 6.5 % OTIC solution Place 5 drops into both ears 3 (three) times daily. 15 mL 0   carvedilol (COREG) 6.25 MG tablet Take 1 tablet (6.25 mg total) by mouth 2 (two) times daily with a meal. 180 tablet 3   cephALEXin (KEFLEX) 500 MG capsule Take 1 capsule (500 mg total) by mouth 3 (three) times daily. 90 capsule 11   cyanocobalamin 500 MCG tablet Take 1,000 mcg by mouth daily.      docusate sodium (COLACE) 100 MG capsule Take 2 capsules (200 mg total) by mouth daily as needed for mild constipation. 10 capsule 0   fluticasone (FLONASE) 50 MCG/ACT nasal spray Place 1 spray into both nostrils 2 (two) times daily as needed for allergies. 9.9 mL 5   hydrALAZINE (APRESOLINE) 100 MG tablet Take 1 tablet (100 mg total) by mouth 3 (three) times daily. 90 tablet 3   hydrOXYzine (ATARAX/VISTARIL) 50 MG tablet Take 1 tablet (50 mg total) by mouth 3 (three) times daily as needed for itching. 60 tablet 3   Ipratropium-Albuterol (COMBIVENT) 20-100 MCG/ACT AERS respimat Inhale 1 puff into the lungs every 6 (six) hours as needed for wheezing or shortness of breath. 4 g 5   nystatin (MYCOSTATIN/NYSTOP) powder Apply 1 application topically 3 (three) times daily. 15 g 3   pantoprazole (PROTONIX) 40 MG tablet Take 1 tablet (40 mg total) by mouth daily. Take 40 mg twice daily for 8 weeks (2 months) and then back down to 40 mg daily (Patient taking differently: Take 40 mg by mouth daily.) 90 tablet 3    sacubitril-valsartan (ENTRESTO) 97-103 MG Take 1 tablet by mouth 2 (two) times daily. 180 tablet 3   sildenafil (VIAGRA) 100 MG tablet Take 0.5 tablets (50 mg total) by mouth daily as needed for erectile dysfunction. 10 tablet 6   thiamine 100 MG tablet Take 1 tablet (100 mg total) by mouth daily. 30 tablet 0   varenicline (CHANTIX CONTINUING MONTH PAK) 1 MG tablet Take 1 tablet (  1 mg total) by mouth 2 (two) times daily. 60 tablet 3   warfarin (COUMADIN) 5 MG tablet Take 2.5 mg (1/2 tab) every Tuesday and 5 mg (1 tablet) all other days or as instructed by [facility] 100 tablet 3   Zinc Gluconate 100 MG TABS Take 1 tablet (100 mg total) by mouth daily. 90 tablet 3   zolpidem (AMBIEN) 5 MG tablet Take 1 tablet (5 mg total) by mouth at bedtime. 30 tablet 3   gabapentin (NEURONTIN) 300 MG capsule Take 2 capsules (600 mg total) by mouth 4 (four) times daily. 240 capsule 5   spironolactone (ALDACTONE) 25 MG tablet Take 2 tablets (50 mg total) by mouth daily. 180 tablet 3   No current facility-administered medications for this encounter.    Chlorhexidine and Other  REVIEW OF SYSTEMS: All systems negative except as listed in HPI, PMH and Problem list.   LVAD INTERROGATION:  Please see LVAD nurse's note above for details.   I reviewed the LVAD parameters from today, and compared the results to the patient's prior recorded data.  No programming changes were made.  The LVAD is functioning within specified parameters.  The patient performs LVAD self-test daily.  LVAD interrogation was negative for any significant power changes, alarms or PI events/speed drops.  LVAD equipment check completed and is in good working order.  Back-up equipment present.   LVAD education done on emergency procedures and precautions and reviewed exit site care.   MAP 109  Physical Exam: General: Well appearing this am. NAD.  HEENT: Normal. Neck: Supple, JVP 8 cm. Carotids OK.  Cardiac:  Mechanical heart sounds with LVAD hum  present.  Lungs:  CTAB, normal effort.  Abdomen:  NT, ND, no HSM. No bruits or masses. +BS  LVAD exit site: Well-healed and incorporated. Dressing dry and intact. No erythema or drainage. Stabilization device present and accurately applied. Driveline dressing changed daily per sterile technique. Extremities:  Warm and dry. No cyanosis, clubbing, rash, or edema.  Neuro:  Alert & oriented x 3. Cranial nerves grossly intact. Moves all 4 extremities w/o difficulty. Affect pleasant      ASSESSMENT AND PLAN:   1. 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  LVAD parameters stable.  MAP elevated.  NYHA class II.   He does not look volume overloaded. Stable LVAD parameters.  - He does not need Lasix.      - Increase spironolactone to 50 mg daily. BMET today and in 10 days.  - Continue Entresto 97/103 bid.   - Continue hydralazine 100 mg tid.  - Add Coreg 6.25 mg bid.  - He is off ASA with GI bleeding.  - Continue warfarin for INR 1.8-2.3.    - Should be transplant candidate eventually if he can quit smoking.  Will make transplant [facility] referral when he is off totally.  2. Smoking: He has cut back a lot but still smoking.  Continue Chantix.   3. RLE DVT: On warfarin for LVAD.  4. OSA: Continue CPAP.  5. Hyperlipidemia: Atorvastatin.  6. Type II diabetes: Per PCP.  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  Site improved.  - He will have long-term Keflex for MSSA suppression.  Followup with ID.  8. 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 No overt bleeding.   - Off ASA, INR goal now 1.8-2.3.  - Continue monthly octreotide injections.  -  CBC today.   Followup in 6 wks  Loralie Champagne 10/01/2021

## 2021-10-01 NOTE — Patient Instructions (Signed)
Decrease snacking Increase walking Increase Spiro to 50 mg daily Start Coreg 6.25 mg twice daily  Lab in 10 days Return to [facility] Clinic in 6 weeks Lauren will call you with INR results and warfarin dosing

## 2021-10-01 NOTE — Progress Notes (Signed)
LVAD INR 

## 2021-10-09 ENCOUNTER — Ambulatory Visit (HOSPITAL_COMMUNITY)
Admission: RE | Admit: 2021-10-09 | Discharge: 2021-10-09 | Disposition: A | Discharge status: Home / Self Care | Payer: Medicare HMO | Source: Ambulatory Visit | Attending: Cardiology | Admitting: Cardiology

## 2021-10-09 ENCOUNTER — Ambulatory Visit (HOSPITAL_COMMUNITY): Payer: Self-pay | Admitting: Pharmacist

## 2021-10-09 ENCOUNTER — Other Ambulatory Visit (HOSPITAL_COMMUNITY): Payer: Self-pay | Admitting: *Deleted

## 2021-10-09 ENCOUNTER — Other Ambulatory Visit: Payer: Self-pay

## 2021-10-09 DIAGNOSIS — K31811 Angiodysplasia of stomach and duodenum with bleeding: Secondary | ICD-10-CM

## 2021-10-09 DIAGNOSIS — Z95811 Presence of heart assist device: Secondary | ICD-10-CM

## 2021-10-09 DIAGNOSIS — K31819 Angiodysplasia of stomach and duodenum without bleeding: Secondary | ICD-10-CM | POA: Diagnosis present

## 2021-10-09 LAB — POCT INR: INR: 1.9 — AB (ref 2.0–3.0)

## 2021-10-09 MED ORDER — OCTREOTIDE ACETATE 20 MG IM KIT
20.0000 mg | PACK | INTRAMUSCULAR | Status: DC
  Administered 2021-10-09: 20 mg via INTRAMUSCULAR
  Filled 2021-10-09: qty 1

## 2021-10-09 NOTE — Progress Notes (Signed)
LVAD INR 

## 2021-10-12 ENCOUNTER — Other Ambulatory Visit (HOSPITAL_COMMUNITY): Payer: Self-pay | Admitting: *Deleted

## 2021-10-12 DIAGNOSIS — I5022 Chronic systolic (congestive) heart failure: Secondary | ICD-10-CM

## 2021-10-12 DIAGNOSIS — Z95811 Presence of heart assist device: Secondary | ICD-10-CM

## 2021-10-12 DIAGNOSIS — Z79899 Other long term (current) drug therapy: Secondary | ICD-10-CM

## 2021-10-15 ENCOUNTER — Ambulatory Visit (HOSPITAL_COMMUNITY)
Admission: RE | Admit: 2021-10-15 | Discharge: 2021-10-15 | Disposition: A | Discharge status: Home / Self Care | Payer: Medicare HMO | Source: Ambulatory Visit | Attending: Cardiology | Admitting: Cardiology

## 2021-10-15 ENCOUNTER — Other Ambulatory Visit: Payer: Self-pay

## 2021-10-15 ENCOUNTER — Ambulatory Visit (HOSPITAL_COMMUNITY): Payer: Self-pay | Admitting: Pharmacist

## 2021-10-15 DIAGNOSIS — I5022 Chronic systolic (congestive) heart failure: Secondary | ICD-10-CM

## 2021-10-15 DIAGNOSIS — Z79899 Other long term (current) drug therapy: Secondary | ICD-10-CM | POA: Diagnosis not present

## 2021-10-15 DIAGNOSIS — Z95811 Presence of heart assist device: Secondary | ICD-10-CM | POA: Diagnosis not present

## 2021-10-15 LAB — BASIC METABOLIC PANEL
Anion gap: 5 (ref 5–15)
BUN: 9 mg/dL (ref 8–23)
CO2: 26 mmol/L (ref 22–32)
Calcium: 9.2 mg/dL (ref 8.9–10.3)
Chloride: 107 mmol/L (ref 98–111)
Creatinine, Ser: 1.01 mg/dL (ref 0.61–1.24)
GFR, Estimated: >60 mL/min (ref >60)
Glucose, Bld: 147 mg/dL — ABNORMAL HIGH (ref 70–99)
Potassium: 4.1 mmol/L (ref 3.5–5.1)
Sodium: 138 mmol/L (ref 135–145)

## 2021-10-15 LAB — POCT INR: INR: 2 (ref 2.0–3.0)

## 2021-10-15 NOTE — Progress Notes (Signed)
LVAD INR 

## 2021-10-22 ENCOUNTER — Ambulatory Visit (HOSPITAL_COMMUNITY): Payer: Self-pay | Admitting: Pharmacist

## 2021-10-22 LAB — POCT INR: INR: 2.2 (ref 2.0–3.0)

## 2021-10-22 NOTE — Progress Notes (Signed)
LVAD INR 

## 2021-10-29 ENCOUNTER — Ambulatory Visit (HOSPITAL_COMMUNITY): Payer: Self-pay | Admitting: Pharmacist

## 2021-10-29 ENCOUNTER — Other Ambulatory Visit: Payer: Self-pay

## 2021-10-29 ENCOUNTER — Ambulatory Visit (INDEPENDENT_AMBULATORY_CARE_PROVIDER_SITE_OTHER): Payer: Medicare HMO | Admitting: Infectious Diseases

## 2021-10-29 ENCOUNTER — Encounter: Payer: Self-pay | Admitting: Infectious Diseases

## 2021-10-29 DIAGNOSIS — T827XXA Infection and inflammatory reaction due to other cardiac and vascular devices, implants and grafts, initial encounter: Secondary | ICD-10-CM | POA: Diagnosis not present

## 2021-10-29 DIAGNOSIS — T8130XA Disruption of wound, unspecified, initial encounter: Secondary | ICD-10-CM | POA: Diagnosis not present

## 2021-10-29 DIAGNOSIS — Z5181 Encounter for therapeutic drug level monitoring: Secondary | ICD-10-CM | POA: Diagnosis not present

## 2021-10-29 LAB — POCT INR: INR: 2.3 (ref 2.0–3.0)

## 2021-10-29 MED ORDER — CEPHALEXIN 500 MG PO CAPS: 500.0000 mg | ORAL_CAPSULE | Freq: Three times a day (TID) | ORAL | 11 refills | Status: DC

## 2021-10-29 NOTE — Progress Notes (Signed)
Patient: [facility]  DOB: [birth year] MRN: [id number] PCP: [name], MD    Subjective:  CC: FU chronic LVAD MSSA infection (bacteremic several times, sternal abscess, DL infections) No concerns today.    ID Hx: [facility] is a 60 y.o. 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   January-2020: MSSA infection of counter incision to mid abdomen; s/p serial debridements. Initially did not involve exit site at this time. Received IV Cefazolin x 4 weeks --> PO Cephalexin x 4 weeks.  May-2020: recurrent MSSA infection, now involving driveline exit site and secondary bacteremia. TEE deferred due to low sensitivity for detecting VAD endocarditis. 6 weeks IV Cefazolin s/p abd wall debridement --> cephalexin for chronic suppression for duration pump is in place.   March-2021 sudden onset subxiphoid abscess involving proximal driveline, MSSA on cultures. Blood cultures negative. S/P I&D of abdominal wall exit site and subxiphoid space. IV cefazolin x 8 weeks (May 10th 2021).  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   Apr 16, 2020 - readmitted following re-opening and bleeding of subxiphoid wound. +MSSA and taken for debridement 6/01. Required repair of outflow graft at this surgery as well (?sharp puncture vs degenerated graft d/t infection). Vertical Rectus Flap repair performed with serial debridements. BC (-). PICC Cefazolin + Rifabutin planned through 7/7  April 27, 2020 - bleeding from sternum r/t subxiphoid abscess --> debridement in OR, Cx again grew out MSSA. Back on IV cefazolin for treatment.   March 2022 - enterobacter cloacae growing from non-healing soft tissue wound. Tx 2 weeks cipro, continued on cephalexin.     HPI: [facility] and [facility] are here for routine follow up. They have been  quite pleased that the driveline site has continued to remain mostly dry, scab every so often. Two areas along abdominal incision that previously dehisced have healed over and they cover with band-aid with periodic checks under. No drainage from anywhere. Sternum is stable. Continues on Cephalexin 500 mg TID.  No concerns for any side effects to the antibiotics.  No new other medications.  Continues to follow-up with the LVAD team every 2 months.   Up to date on all recommended vaccines at this time.    Review of Systems  Constitutional:  Negative for chills and fever.  HENT:  Negative for tinnitus.   Eyes:  Negative for blurred vision and photophobia.  Respiratory:  Negative for cough and sputum production.   Cardiovascular:  Negative for chest pain.  Gastrointestinal:  Negative for diarrhea, nausea and vomiting.  Genitourinary:  Negative for dysuria.  Skin:  Negative for rash.  Neurological:  Negative for headaches.     Past Medical History:  Diagnosis Date   Abscess 01/2020   sternal abscess   Cardiomyopathy, unspecified (HCC)    CHF (congestive heart failure) (HCC)    Chronic back pain    Diabetes mellitus without complication (HCC)    Enlarged heart    Gastric ulcer    Gastroenteritis    H/O degenerative disc disease    Hypertension    LVAD (left ventricular assist device) present [facility])     Outpatient Medications Prior to Visit  Medication Sig Dispense Refill   acetaminophen (TYLENOL) 500 MG tablet Take 1,000 mg by mouth every 6 (six) hours as needed for mild pain.     carbamide  peroxide (DEBROX) 6.5 % OTIC solution Place 5 drops into both ears 3 (three) times daily. 15 mL 0   carvedilol (COREG) 6.25 MG tablet Take 1 tablet (6.25 mg total) by mouth 2 (two) times daily with a meal. 180 tablet 3   cyanocobalamin 500 MCG tablet Take 1,000 mcg by mouth daily.      docusate sodium (COLACE) 100 MG capsule Take 2 capsules (200 mg total) by mouth daily as needed for mild  constipation. 10 capsule 0   fluticasone (FLONASE) 50 MCG/ACT nasal spray Place 1 spray into both nostrils 2 (two) times daily as needed for allergies. 9.9 mL 5   gabapentin (NEURONTIN) 300 MG capsule Take 2 capsules (600 mg total) by mouth 4 (four) times daily. 240 capsule 5   hydrALAZINE (APRESOLINE) 100 MG tablet Take 1 tablet (100 mg total) by mouth 3 (three) times daily. 90 tablet 3   hydrOXYzine (ATARAX/VISTARIL) 50 MG tablet Take 1 tablet (50 mg total) by mouth 3 (three) times daily as needed for itching. 60 tablet 3   Ipratropium-Albuterol (COMBIVENT) 20-100 MCG/ACT AERS respimat Inhale 1 puff into the lungs every 6 (six) hours as needed for wheezing or shortness of breath. 4 g 5   nystatin (MYCOSTATIN/NYSTOP) powder Apply 1 application topically 3 (three) times daily. 15 g 3   pantoprazole (PROTONIX) 40 MG tablet Take 1 tablet (40 mg total) by mouth daily. Take 40 mg twice daily for 8 weeks (2 months) and then back down to 40 mg daily (Patient taking differently: Take 40 mg by mouth daily.) 90 tablet 3   sacubitril-valsartan (ENTRESTO) 97-103 MG Take 1 tablet by mouth 2 (two) times daily. 180 tablet 3   sildenafil (VIAGRA) 100 MG tablet Take 0.5 tablets (50 mg total) by mouth daily as needed for erectile dysfunction. 10 tablet 6   spironolactone (ALDACTONE) 25 MG tablet Take 2 tablets (50 mg total) by mouth daily. 180 tablet 3   thiamine 100 MG tablet Take 1 tablet (100 mg total) by mouth daily. 30 tablet 0   varenicline (CHANTIX CONTINUING MONTH PAK) 1 MG tablet Take 1 tablet (1 mg total) by mouth 2 (two) times daily. 60 tablet 3   warfarin (COUMADIN) 5 MG tablet Take 2.5 mg (1/2 tab) every Tuesday and 5 mg (1 tablet) all other days or as instructed by [facility] 100 tablet 3   Zinc Gluconate 100 MG TABS Take 1 tablet (100 mg total) by mouth daily. 90 tablet 3   zolpidem (AMBIEN) 5 MG tablet Take 1 tablet (5 mg total) by mouth at bedtime. 30 tablet 3   cephALEXin (KEFLEX) 500 MG capsule Take  1 capsule (500 mg total) by mouth 3 (three) times daily. 90 capsule 11   No facility-administered medications prior to visit.     Allergies  Allergen Reactions   Chlorhexidine Rash    Blisters   Other Rash    Prep pads    Social History   Tobacco Use   Smoking status: Every Day    Packs/day: 0.25    Types: Cigarettes    Start date: 2003   Smokeless tobacco: Never   Tobacco comments:    slowed down  Vaping Use   Vaping Use: Never used  Substance Use Topics   Alcohol use: Not Currently   Drug use: Not Currently     Objective:   Vitals:    There is no height or weight on file to calculate BMI.   Physical Exam Vitals reviewed.  Constitutional:      Appearance: Normal appearance. He is not ill-appearing.  Eyes:     General: No scleral icterus.    Pupils: Pupils are equal, round, and reactive to light.  Cardiovascular:     Rate and Rhythm: Normal rate.     Comments: LVAD Humm +  Pulmonary:     Effort: Pulmonary effort is normal.  Abdominal:     Palpations: Abdomen is soft.     Tenderness: There is no abdominal tenderness.       Comments: All dressings intact and clean/dry.   Skin:    General: Skin is warm and dry.     Capillary Refill: Capillary refill takes less than 2 seconds.  Neurological:     Mental Status: He is alert and oriented to person, place, and time.      Lab Results: Lab Results  Component Value Date   WBC 8.0 10/01/2021   HGB 12.6 (L) 10/01/2021   HCT 39.9 10/01/2021   MCV 85.6 10/01/2021   PLT 215 10/01/2021    Lab Results  Component Value Date   CREATININE 1.01 10/15/2021   BUN 9 10/15/2021   NA 138 10/15/2021   K 4.1 10/15/2021   CL 107 10/15/2021   CO2 26 10/15/2021    Sed Rate (mm/hr)  Date Value  05/28/2021 4  03/27/2021 1  01/16/2021 1   CRP (mg/dL)  Date Value  05/28/2021 0.6  03/27/2021 0.7  01/16/2021 0.7     Assessment & Plan:   Problem List Items Addressed This Visit       High   Infection  associated with driveline of left ventricular assist device (LVAD) ([place]) - Primary    Doing well on chronic suppressive cephalexin 500 mg TID. He is not experiencing any adverse effects nor any signs of active infection. 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  Discussed intention of continuing this suppressive antibiotic for lifelong duration or device duration (they still have hope of becoming transplant candidate at some point). Understand to never stop taking antibiotics. I offered once yearly visits to refill his abx, but he would like to come back again in 58m for some reassurance. Last intervention required for his MSSA was June 2021.       Relevant Medications   cephALEXin (KEFLEX) 500 MG capsule     Unprioritized   Abdominal wound dehiscence    Complete epithelialization over both areas along MAI since treating enterobacter earlier this year. No concern for superficial infection.       Encounter for medication monitoring    In review of labs at [facility] is tolerating long term suppressive cephalexin very well. CBC stable, no myelosuppression. No diarrhea. No interference with AC.  Would continue with Q3-50m CBC, CMP to ensure no changes required.        Janene Madeira, MSN, NP-C [facility] [email or web address] Pager: [phone] Office: [phone] [place]: [phone]

## 2021-10-29 NOTE — Progress Notes (Signed)
LVAD INR 

## 2021-10-29 NOTE — Assessment & Plan Note (Signed)
In review of labs at [facility] clinic [facility] is tolerating long term suppressive cephalexin very well. CBC stable, no myelosuppression. No diarrhea. No interference with AC.  Would continue with Q3-58m CBC, CMP to ensure no changes required.

## 2021-10-29 NOTE — Patient Instructions (Signed)
So nice to see you  Everything looks like it has been going very well. No changes.   Please continue your antibiotic (Keflex) 3 times a day. This will continue as long as the pump is still in you.   Please come back in 6 months to check in

## 2021-10-29 NOTE — Assessment & Plan Note (Signed)
Complete epithelialization over both areas along MAI since treating enterobacter earlier this year. No concern for superficial infection.

## 2021-10-29 NOTE — Assessment & Plan Note (Signed)
Doing well on chronic suppressive cephalexin 500 mg TID. He is not experiencing any adverse effects nor any signs of active infection. 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  Discussed intention of continuing this suppressive antibiotic for lifelong duration or device duration (they still have hope of becoming transplant candidate at some point). Understand to never stop taking antibiotics. I offered once yearly visits to refill his abx, but he would like to come back again in 71m for some reassurance. Last intervention required for his MSSA was June 2021.

## 2021-11-06 ENCOUNTER — Ambulatory Visit (HOSPITAL_COMMUNITY): Payer: Self-pay | Admitting: Pharmacist

## 2021-11-06 ENCOUNTER — Other Ambulatory Visit: Payer: Self-pay

## 2021-11-06 ENCOUNTER — Encounter (HOSPITAL_COMMUNITY)
Admission: RE | Admit: 2021-11-06 | Discharge: 2021-11-06 | Disposition: A | Discharge status: Home / Self Care | Payer: Medicare HMO | Source: Ambulatory Visit | Attending: Cardiology | Admitting: Cardiology

## 2021-11-06 DIAGNOSIS — Z95811 Presence of heart assist device: Secondary | ICD-10-CM | POA: Insufficient documentation

## 2021-11-06 DIAGNOSIS — K31811 Angiodysplasia of stomach and duodenum with bleeding: Secondary | ICD-10-CM | POA: Insufficient documentation

## 2021-11-06 LAB — POCT INR: INR: 1.9 — AB (ref 2.0–3.0)

## 2021-11-06 MED ORDER — OCTREOTIDE ACETATE 20 MG IM KIT
20.0000 mg | PACK | INTRAMUSCULAR | Status: DC
  Administered 2021-11-06: 11:00:00 20 mg via INTRAMUSCULAR
  Filled 2021-11-06: qty 1

## 2021-11-06 NOTE — Progress Notes (Signed)
LVAD INR 

## 2021-11-12 ENCOUNTER — Encounter (HOSPITAL_COMMUNITY): Payer: Medicare HMO

## 2021-11-13 ENCOUNTER — Ambulatory Visit (HOSPITAL_COMMUNITY): Payer: Self-pay | Admitting: Pharmacist

## 2021-11-13 LAB — POCT INR: INR: 1.7 — AB (ref 2.0–3.0)

## 2021-11-13 NOTE — Progress Notes (Signed)
LVAD INR 

## 2021-11-15 ENCOUNTER — Other Ambulatory Visit (HOSPITAL_COMMUNITY): Payer: Self-pay | Admitting: *Deleted

## 2021-11-15 DIAGNOSIS — I5022 Chronic systolic (congestive) heart failure: Secondary | ICD-10-CM

## 2021-11-15 DIAGNOSIS — Z7901 Long term (current) use of anticoagulants: Secondary | ICD-10-CM

## 2021-11-15 DIAGNOSIS — Z95811 Presence of heart assist device: Secondary | ICD-10-CM

## 2021-11-20 ENCOUNTER — Ambulatory Visit (HOSPITAL_COMMUNITY): Payer: Self-pay | Admitting: Pharmacist

## 2021-11-20 ENCOUNTER — Encounter (HOSPITAL_COMMUNITY): Payer: Self-pay

## 2021-11-20 ENCOUNTER — Other Ambulatory Visit: Payer: Self-pay

## 2021-11-20 ENCOUNTER — Ambulatory Visit (HOSPITAL_COMMUNITY)
Admission: RE | Admit: 2021-11-20 | Discharge: 2021-11-20 | Disposition: A | Discharge status: Home / Self Care | Payer: Medicare HMO | Source: Ambulatory Visit | Attending: Internal Medicine | Admitting: Internal Medicine

## 2021-11-20 DIAGNOSIS — Z86718 Personal history of other venous thrombosis and embolism: Secondary | ICD-10-CM | POA: Insufficient documentation

## 2021-11-20 DIAGNOSIS — G4733 Obstructive sleep apnea (adult) (pediatric): Secondary | ICD-10-CM | POA: Insufficient documentation

## 2021-11-20 DIAGNOSIS — L299 Pruritus, unspecified: Secondary | ICD-10-CM

## 2021-11-20 DIAGNOSIS — I159 Secondary hypertension, unspecified: Secondary | ICD-10-CM

## 2021-11-20 DIAGNOSIS — Z4502 Encounter for adjustment and management of automatic implantable cardiac defibrillator: Secondary | ICD-10-CM | POA: Insufficient documentation

## 2021-11-20 DIAGNOSIS — K922 Gastrointestinal hemorrhage, unspecified: Secondary | ICD-10-CM | POA: Insufficient documentation

## 2021-11-20 DIAGNOSIS — K7030 Alcoholic cirrhosis of liver without ascites: Secondary | ICD-10-CM | POA: Insufficient documentation

## 2021-11-20 DIAGNOSIS — F17210 Nicotine dependence, cigarettes, uncomplicated: Secondary | ICD-10-CM | POA: Insufficient documentation

## 2021-11-20 DIAGNOSIS — E785 Hyperlipidemia, unspecified: Secondary | ICD-10-CM | POA: Diagnosis not present

## 2021-11-20 DIAGNOSIS — Z4801 Encounter for change or removal of surgical wound dressing: Secondary | ICD-10-CM | POA: Diagnosis not present

## 2021-11-20 DIAGNOSIS — I5022 Chronic systolic (congestive) heart failure: Secondary | ICD-10-CM | POA: Diagnosis present

## 2021-11-20 DIAGNOSIS — E119 Type 2 diabetes mellitus without complications: Secondary | ICD-10-CM | POA: Insufficient documentation

## 2021-11-20 DIAGNOSIS — Z7901 Long term (current) use of anticoagulants: Secondary | ICD-10-CM | POA: Diagnosis not present

## 2021-11-20 DIAGNOSIS — Z95811 Presence of heart assist device: Secondary | ICD-10-CM

## 2021-11-20 DIAGNOSIS — Z79899 Other long term (current) drug therapy: Secondary | ICD-10-CM | POA: Insufficient documentation

## 2021-11-20 LAB — CBC
HCT: 40.2 % (ref 39.0–52.0)
Hemoglobin: 13.1 g/dL (ref 13.0–17.0)
MCH: 27.8 pg (ref 26.0–34.0)
MCHC: 32.6 g/dL (ref 30.0–36.0)
MCV: 85.4 fL (ref 80.0–100.0)
Platelets: 208 10*3/uL (ref 150–400)
RBC: 4.71 MIL/uL (ref 4.22–5.81)
RDW: 14.9 % (ref 11.5–15.5)
WBC: 6.3 10*3/uL (ref 4.0–10.5)
nRBC: 0 % (ref 0.0–0.2)

## 2021-11-20 LAB — BASIC METABOLIC PANEL
Anion gap: 8 (ref 5–15)
BUN: 10 mg/dL (ref 8–23)
CO2: 25 mmol/L (ref 22–32)
Calcium: 8.7 mg/dL — ABNORMAL LOW (ref 8.9–10.3)
Chloride: 105 mmol/L (ref 98–111)
Creatinine, Ser: 0.96 mg/dL (ref 0.61–1.24)
GFR, Estimated: >60 mL/min (ref >60)
Glucose, Bld: 173 mg/dL — ABNORMAL HIGH (ref 70–99)
Potassium: 4 mmol/L (ref 3.5–5.1)
Sodium: 138 mmol/L (ref 135–145)

## 2021-11-20 LAB — LACTATE DEHYDROGENASE: LDH: 197 U/L — ABNORMAL HIGH (ref 98–192)

## 2021-11-20 LAB — PROTIME-INR
INR: 2 — ABNORMAL HIGH (ref 0.8–1.2)
Prothrombin Time: 23 seconds — ABNORMAL HIGH (ref 11.4–15.2)

## 2021-11-20 MED ORDER — HYDRALAZINE HCL 100 MG PO TABS: 100.0000 mg | ORAL_TABLET | Freq: Three times a day (TID) | ORAL | 3 refills | Status: DC

## 2021-11-20 NOTE — Progress Notes (Signed)
LVAD INR 

## 2021-11-20 NOTE — Patient Instructions (Addendum)
No medication changes today Coumadin dosing per Lauren PharmD Return to [facility] clinic in 2 months. We will complete your 2.5 year Intermacs with 6 minute walk at this visit

## 2021-11-20 NOTE — Progress Notes (Addendum)
Patient presents for 2 mo f/u in [facility] today alone. Reports no problems with VAD equipment or concerns with drive line.  Pt reports he has been feeling great. They just returned from a trip to his daughter's in [place]. Denies lightheadedness, dizziness, falls, and signs of bleeding. Reports he has been watching his diet, avoiding carbs/starches as much as he can.   Reports he is still smoking a few cigarettes a day. He started Chantix as instructed last visit; he is now taking 1 mg bid.   Drive line dressing change completed today as documented below. Remains on Keflex TID per ID.   BP slightly elevated today. He reports he increased Spironolactone to 50 mg daily, and started Coreg 6.25 mg BID as instructed at his last visit. He has not taken Coreg yet this morning as he has not eaten breakfast yet.   Reports the [facility] sent him to see a sports medicine doctor. They CT scanned his neck and back. Recommending physical therapy vs steroid injections. Pt thinks he will pursue PT.  Refill sent for Hydralazine to patient's pharmacy. Refill for Ambien called to patient's pharmacy.    Vital Signs: Temp:  Doppler Pressure: 100 Automatc BP: 126/96 (105) HR: 63 SPO2: 98% RA   Weight: 209.2 lb w/o eqt Last weight: 219.8 lb  VAD Indication: Destination Therapy due to smoking status   VAD interrogation & Equipment Management: Speed: 5800 Flow: 4.4 Power:  4.5 w    PI: 6.0 Hct: 32   Alarms: few low voltage advisories  Events: 30 - 60 PI events daily Fixed speed: 5800 Low speed limit: 5500  Primary Controller:  Replace back up battery in 29 months. Back up controller:   Replace back up battery in 21 months.   Annual Equipment Maintenance on UBC/PM was performed on 07/30/21.  I reviewed the LVAD parameters from today and compared the results to the patient's prior recorded data. LVAD interrogation was NEGATIVE for significant power changes, NEGATIVE for clinical alarms and NEGATIVE  for PI events/speed drops. No programming changes were made and pump is functioning within specified parameters. Pt is performing daily controller and system monitor self tests along with completing weekly and monthly maintenance for LVAD equipment.  LVAD equipment check completed and is in good working order. Back-up equipment present.   Abdominal incision care: Band aid intact to upper wound; changed prn per [name]. Lower wound left open with no drainage reported.   Exit Site Care: Drive line is being maintained weekly by [employer]. Gauze dressing with anchor intact and accurately applied. Existing VAD dressing removed and site care performed using sterile technique. Drive line exit site cleaned with betadine swabs x 2, allowed to dry, and gauze dressing with NO SILVER applied. Exit site healed and incorporated, the velour is fully implanted at exit site. No redness, tenderness, drainage, foul odor or rash noted. Drive line anchor correctly applied. Pt denies fever or chills. Pt has adequate dressing supplies at home.     Device: N/A   BP & Labs: o Doppler 100- Doppler is reflecting MAP   Hgb 13.1 - No S/S of bleeding. Specifically denies BRBPR.    LDH stable at 197 with established baseline of 150 - 220. Denies tea-colored urine. No power elevations noted on interrogation.   Patient Instructions: No medication changes today Coumadin dosing per Lauren PharmD Return to [facility] clinic in 2 months  Emerson Monte RN [place] Coordinator  Office: [phone]  24/7 Pager: [phone]  y.o. with history of nonischemic cardiomyopathy, RLE DVT, cirrhosis, smoking, and OSA returns for followup of CHF/LVAD placement.  Cardiomyopathy was diagnosed in 6/19 in [place], [place] at that time showed low output.  He was admitted to [facility] in 9/19 with low output HF and was started on milrinone and diuresed.  Unable to wean off milrinone.  He had a degree of RV failure, but this improved on milrinone.   Valvular heart disease also looked better with milrinone and diuresis.  On 08/03/18, he had Heartmate 3 LVAD placed.  Speed was optimized by ramp echo post-op.  Post-op course was relatively unremarkable.  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  No abscess on CT.  He was started on cefazolin IV for 6 wks.  BP-active meds decreased with low MAP.   He was admitted in 3/21 with subxiphoid abscess and cellulitis.  CT showed collection along the course of the driveline.  There was no evidence for sternal osteomyelitis.  Abscess was debrided, MSSA grew from wound cultures.  Wound vac was placed.  Patient was started on cefazolin, which was recently stopped after completing 8 wks.   Patient was re-admitted later in 3/21 with facial Zoster.  He was treated with acyclovir and this has resolved.   He was admitted in 6/21 with bleeding from the site of the subxiphoid abscess as well as cellulitis.  He required multiple units of PRBCs.  He went to the OR initially for I&D.  Blood and wound cultures grew MSSA.  He then went back to the OR for rectus flap over wound site (plastics).  He was started on cefazolin and rifabutin, has PICC.   He was admitted in 7/21 with GI bleeding, he had 6 units PRBCs.  EGD showed duodenal AVMs, treated with APC.  INR goal with warfarin lowered to 1.8-2.3.   Echo in 5/22 was a difficult study with EF 25-30%, mild LVH, aortic valve difficult to visualize but appears to open with each beat, mildly dilated RV with moderately decreased systolic function.   He returns for LVAD followup.   Still smoking but has cut back on Chantix.  No driveline drainage.  MAP 100 today but has not taken all his BP-active meds.  Weight down 10 lbs.  Has been walking a lot, especially when he went to see his daughter in [place] for Christmas.  Dyspnea only after walking a long distance.  No lightheadedness.  No chest  pain.  No orthopnea/PND.    Denies LVAD alarms.  Reports taking Coumadin as prescribed and adherence to anticoagulation based dietary restrictions.  Denies bright red blood per rectum or melena, no dark urine or hematuria.    Labs (9/19): LDH 210, INR 2.17, WBCs 17.1 => 15, hgb 9.7, creatinine 0.72 Labs (10/19): K 4.3, creatinine 0.78 => 0.83, hgb 10.2 Labs (11/19): creatinine 0.79, hgb 10 Labs (2/20): K 3.9, creatinine 0.79 Labs (4/20): K 4, creatinine 1.15, hgb 11.4, LDH 199, INR 1.9 Labs (5/20): hgb 10.4, WBCs 8.5 Labs (6/20): K 4.2, creatinine 1.06, hgb 11.2 Labs (8/20): k 3.7, creatinine 1.0, hgb 11.5, LDH 186 Labs (10/20): K 3.5, creatinine 0.93, INR 2, LDH 221 Labs (12/20): hgb 15.6, K 3.7, creatinine 1.02, LDH 250 Labs (4/21): K 4.1, creatinine 0.86, hgb 11.1 Labs (6/21): K 4.1, creatinine 0.76, hgb 10, WBCs 11.2 Labs (8/21): hgb 10.6 Labs (10/21): hgb 13.2, creatinine 1.17 Labs (7/22): hgb 8, LDH  372, INR 1.9, creatinine 0.95 Labs (8/22): hgb 12.6 Labs (9/22): K 3.9, creatinine 1.1 Labs (11/22): creatinine 1.01  PMH: 1. Degenerative disc disease.  2. GERD 3. Chronic systolic CHF:  Nonischemic cardiomyopathy.  Dilated cardiomyopathy diagnosed 6/19 in [place].  LHC/RHC in 7/19 showed elevated filling pressures, low cardiac output, and no significant CAD.   - Echo (9/19): Severe LV dilation with EF 10-20%, moderate-severe MR, severely dilated RV with mildly decreased systolic function, severe TR.  - Cardiac MRI (9/19): EF 14%, moderate dilated LV, severely dilated RV with mod-severe systolic function and EF [id number], nonspecific RV insertion site LGE.  - RHC (5/19) on milrinone 0.375: mean RA 8, PA 40/19, mean PCWP 12, PAPi 2.65, CI 2.71.  - Echo (9/19) on milrinone and diuresed): EF 20-25% with moderate LV dilation, moderately dilated RV with mildly decreased systolic function, mild-moderate MR, moderate TR, PASP 51 mmHg.  Cannot rule out noncompaction.  - Heartmate 3 LVAD placement  in 9/19.  - Echo (5/22): EF 25-30%, mild LVH, aortic valve difficult to visualize but appears to open with each beat, mildly dilated RV with moderately decreased systolic function. 4. OSA: CPAP use.  5. Prior smoker 6. Type 2 diabetes 7. Hyperlipidemia.  8. H/o NSVT 9. 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  10. RLE DVT: found in 9/19.  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 Subxiphoid abscess involving collection along driveline in [id number], MSSA.  6/21 Rectus flap to cover abscess site.  12. Facial Zoster 3/21 13. 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   Current Outpatient Medications  Medication Sig Dispense Refill   acetaminophen (TYLENOL) 500 MG tablet Take 1,000 mg by mouth every 6 (six) hours as needed for mild pain.     carbamide peroxide (DEBROX) 6.5 % OTIC solution Place 5 drops into both ears 3 (three) times daily. 15 mL 0   carvedilol (COREG) 6.25 MG tablet Take 1 tablet (6.25 mg total) by mouth 2 (two) times daily with a meal. 180 tablet 3   cephALEXin (KEFLEX) 500 MG capsule Take 1 capsule (500 mg total) by mouth 3 (three) times daily. 90 capsule 11   cyanocobalamin 500 MCG tablet Take 1,000 mcg by mouth daily.      fluticasone (FLONASE) 50 MCG/ACT nasal spray Place 1 spray into both nostrils 2 (two) times daily as needed for allergies. 9.9 mL 5   gabapentin (NEURONTIN) 300 MG capsule Take 2 capsules (600 mg total) by mouth 4 (four) times daily. 240 capsule 5   hydrOXYzine (ATARAX/VISTARIL) 50 MG tablet Take 1 tablet (50 mg total) by mouth 3 (three) times daily as needed for itching. 60 tablet 3   Ipratropium-Albuterol (COMBIVENT) 20-100 MCG/ACT AERS respimat Inhale 1 puff into the lungs every 6 (six) hours as needed for wheezing or shortness of breath. 4 g 5   nystatin (MYCOSTATIN/NYSTOP) powder Apply 1 application topically 3 (three) times daily. 15 g 3   pantoprazole (PROTONIX) 40 MG tablet Take 1  tablet (40 mg total) by mouth daily. Take 40 mg twice daily for 8 weeks (2 months) and then back down to 40 mg daily (Patient taking differently: Take 40 mg by mouth daily.) 90 tablet 3   sacubitril-valsartan (ENTRESTO) 97-103 MG Take 1 tablet by mouth 2 (two) times daily. 180 tablet 3   sildenafil (VIAGRA) 100 MG tablet Take 0.5 tablets (50 mg total) by mouth daily as needed for erectile dysfunction. 10 tablet 6   spironolactone (  ALDACTONE) 25 MG tablet Take 2 tablets (50 mg total) by mouth daily. 180 tablet 3   thiamine 100 MG tablet Take 1 tablet (100 mg total) by mouth daily. 30 tablet 0   varenicline (CHANTIX CONTINUING MONTH PAK) 1 MG tablet Take 1 tablet (1 mg total) by mouth 2 (two) times daily. 60 tablet 3   warfarin (COUMADIN) 5 MG tablet Take 2.5 mg (1/2 tab) every Tuesday and 5 mg (1 tablet) all other days or as instructed by [facility] Clinic 100 tablet 3   Zinc Gluconate 100 MG TABS Take 1 tablet (100 mg total) by mouth daily. 90 tablet 3   zolpidem (AMBIEN) 5 MG tablet Take 1 tablet (5 mg total) by mouth at bedtime. 30 tablet 3   docusate sodium (COLACE) 100 MG capsule Take 2 capsules (200 mg total) by mouth daily as needed for mild constipation. (Patient not taking: Reported on 11/20/2021) 10 capsule 0   hydrALAZINE (APRESOLINE) 100 MG tablet Take 1 tablet (100 mg total) by mouth 3 (three) times daily. 90 tablet 3   No current facility-administered medications for this encounter.    Chlorhexidine and Other  REVIEW OF SYSTEMS: All systems negative except as listed in HPI, PMH and Problem list.   LVAD INTERROGATION:  Please see LVAD nurse's note above for details.   I reviewed the LVAD parameters from today, and compared the results to the patient's prior recorded data.  No programming changes were made.  The LVAD is functioning within specified parameters.  The patient performs LVAD self-test daily.  LVAD interrogation was negative for any significant power changes, alarms or PI  events/speed drops.  LVAD equipment check completed and is in good working order.  Back-up equipment present.   LVAD education done on emergency procedures and precautions and reviewed exit site care.   MAP 109  Physical Exam: General: Well appearing this am. NAD.  HEENT: Normal. Neck: Supple, JVP 7-8 cm. Carotids OK.  Cardiac:  Mechanical heart sounds with LVAD hum present.  Lungs:  CTAB, normal effort.  Abdomen:  NT, ND, no HSM. No bruits or masses. +BS  LVAD exit site: Well-healed and incorporated. Dressing dry and intact. No erythema or drainage. Stabilization device present and accurately applied. Driveline dressing changed daily per sterile technique. Extremities:  Warm and dry. No cyanosis, clubbing, rash, or edema.  Neuro:  Alert & oriented x 3. Cranial nerves grossly intact. Moves all 4 extremities w/o difficulty. Affect pleasant      ASSESSMENT AND PLAN:   1. 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  LVAD parameters stable.  MAP elevated but has not taken all his meds.  Will not increase BP meds today.  NYHA class II.   He does not look volume overloaded. Stable LVAD parameters, has stable but frequent PIs.  - He does not need Lasix.  Stay hydrated.  - Continue spironolactone 50 mg daily, BMET today.  - Continue Entresto 97/103 bid.   - Continue hydralazine 100 mg tid.  - Continue Coreg 6.25 mg bid.  - He is off ASA with GI bleeding.  - Continue warfarin for INR 1.8-2.3.    - Should be transplant candidate eventually if he can quit smoking.  Will make transplant clinic referral when he is off totally.  2. Smoking: He has cut back a lot but still smoking.  Continue Chantix.   3. RLE DVT: On warfarin for LVAD.  4. OSA: Continue CPAP.  5. Hyperlipidemia: Atorvastatin.  6. Type II diabetes: Per PCP.  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  Site improved.  - He will have  long-term Keflex for MSSA suppression.  Followup with ID.  8. 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 No overt bleeding.   - Off ASA, INR goal now 1.8-2.3.  - Continue monthly octreotide injections.  - CBC today  Followup in 2 months  Loralie Champagne 11/20/2021

## 2021-11-26 ENCOUNTER — Ambulatory Visit (HOSPITAL_COMMUNITY): Payer: Self-pay | Admitting: Pharmacist

## 2021-11-26 LAB — POCT INR: INR: 2 (ref 2.0–3.0)

## 2021-11-26 NOTE — Progress Notes (Signed)
LVAD INR 

## 2021-12-03 ENCOUNTER — Encounter (HOSPITAL_COMMUNITY): Payer: Medicare HMO

## 2021-12-04 ENCOUNTER — Ambulatory Visit (HOSPITAL_COMMUNITY)
Admission: RE | Admit: 2021-12-04 | Discharge: 2021-12-04 | Disposition: A | Discharge status: Home / Self Care | Payer: Medicare HMO | Source: Ambulatory Visit | Attending: Cardiology | Admitting: Cardiology

## 2021-12-04 ENCOUNTER — Ambulatory Visit (HOSPITAL_COMMUNITY): Payer: Self-pay | Admitting: Pharmacist

## 2021-12-04 DIAGNOSIS — K31811 Angiodysplasia of stomach and duodenum with bleeding: Secondary | ICD-10-CM | POA: Diagnosis present

## 2021-12-04 DIAGNOSIS — Z95811 Presence of heart assist device: Secondary | ICD-10-CM | POA: Insufficient documentation

## 2021-12-04 LAB — POCT INR: INR: 1.9 — AB (ref 2.0–3.0)

## 2021-12-04 MED ORDER — OCTREOTIDE ACETATE 20 MG IM KIT
20.0000 mg | PACK | INTRAMUSCULAR | Status: DC
  Administered 2021-12-04: 20 mg via INTRAMUSCULAR
  Filled 2021-12-04: qty 1

## 2021-12-04 NOTE — Progress Notes (Signed)
LVAD INR 

## 2021-12-10 ENCOUNTER — Ambulatory Visit (HOSPITAL_COMMUNITY): Payer: Self-pay | Admitting: Pharmacist

## 2021-12-10 LAB — POCT INR: INR: 1.9 — AB (ref 2.0–3.0)

## 2021-12-10 NOTE — Progress Notes (Signed)
LVAD INR 

## 2021-12-18 ENCOUNTER — Ambulatory Visit (HOSPITAL_COMMUNITY): Payer: Self-pay | Admitting: Pharmacist

## 2021-12-18 LAB — POCT INR: INR: 1.9 — AB (ref 2.0–3.0)

## 2021-12-18 NOTE — Progress Notes (Signed)
LVAD INR 

## 2021-12-24 ENCOUNTER — Ambulatory Visit (HOSPITAL_COMMUNITY): Payer: Self-pay | Admitting: Pharmacist

## 2021-12-24 LAB — POCT INR: INR: 2 (ref 2.0–3.0)

## 2021-12-24 NOTE — Progress Notes (Signed)
LVAD INR 

## 2021-12-31 ENCOUNTER — Ambulatory Visit (HOSPITAL_COMMUNITY): Payer: Self-pay | Admitting: Pharmacist

## 2021-12-31 LAB — POCT INR: INR: 2.1 (ref 2.0–3.0)

## 2021-12-31 NOTE — Progress Notes (Signed)
LVAD INR 

## 2022-01-01 ENCOUNTER — Encounter (HOSPITAL_COMMUNITY): Payer: Self-pay | Admitting: Cardiology

## 2022-01-01 ENCOUNTER — Encounter (HOSPITAL_COMMUNITY): Payer: Medicare HMO

## 2022-01-02 ENCOUNTER — Other Ambulatory Visit: Payer: Self-pay

## 2022-01-02 ENCOUNTER — Ambulatory Visit (HOSPITAL_COMMUNITY)
Admission: RE | Admit: 2022-01-02 | Discharge: 2022-01-02 | Disposition: A | Discharge status: Home / Self Care | Payer: Medicare HMO | Source: Ambulatory Visit | Attending: Cardiology | Admitting: Cardiology

## 2022-01-02 DIAGNOSIS — Z95811 Presence of heart assist device: Secondary | ICD-10-CM | POA: Diagnosis present

## 2022-01-02 DIAGNOSIS — K31811 Angiodysplasia of stomach and duodenum with bleeding: Secondary | ICD-10-CM | POA: Diagnosis present

## 2022-01-02 MED ORDER — OCTREOTIDE ACETATE 20 MG IM KIT
20.0000 mg | PACK | INTRAMUSCULAR | Status: DC
  Administered 2022-01-02: 20 mg via INTRAMUSCULAR
  Filled 2022-01-02: qty 1

## 2022-01-07 ENCOUNTER — Ambulatory Visit (HOSPITAL_COMMUNITY): Payer: Self-pay | Admitting: Pharmacist

## 2022-01-07 LAB — POCT INR: INR: 1.9 — AB (ref 2.0–3.0)

## 2022-01-07 NOTE — Progress Notes (Signed)
LVAD INR 

## 2022-01-14 ENCOUNTER — Ambulatory Visit (HOSPITAL_COMMUNITY): Payer: Self-pay | Admitting: Pharmacist

## 2022-01-14 LAB — POCT INR: INR: 1.9 — AB (ref 2.0–3.0)

## 2022-01-14 NOTE — Progress Notes (Signed)
LVAD INR 

## 2022-01-18 ENCOUNTER — Other Ambulatory Visit (HOSPITAL_COMMUNITY): Payer: Self-pay | Admitting: *Deleted

## 2022-01-18 DIAGNOSIS — Z95811 Presence of heart assist device: Secondary | ICD-10-CM

## 2022-01-18 DIAGNOSIS — Z7901 Long term (current) use of anticoagulants: Secondary | ICD-10-CM

## 2022-01-18 DIAGNOSIS — I5022 Chronic systolic (congestive) heart failure: Secondary | ICD-10-CM

## 2022-01-21 ENCOUNTER — Ambulatory Visit (HOSPITAL_COMMUNITY): Payer: Self-pay | Admitting: Pharmacist

## 2022-01-21 ENCOUNTER — Ambulatory Visit (HOSPITAL_COMMUNITY)
Admission: RE | Admit: 2022-01-21 | Discharge: 2022-01-21 | Disposition: A | Discharge status: Home / Self Care | Payer: Medicare HMO | Source: Ambulatory Visit | Attending: Cardiology | Admitting: Cardiology

## 2022-01-21 ENCOUNTER — Other Ambulatory Visit: Payer: Self-pay

## 2022-01-21 ENCOUNTER — Encounter (HOSPITAL_COMMUNITY): Payer: Self-pay

## 2022-01-21 VITALS — BP 120/74 | HR 72 | Wt 202.6 lb

## 2022-01-21 DIAGNOSIS — I428 Other cardiomyopathies: Secondary | ICD-10-CM | POA: Diagnosis not present

## 2022-01-21 DIAGNOSIS — Z792 Long term (current) use of antibiotics: Secondary | ICD-10-CM | POA: Insufficient documentation

## 2022-01-21 DIAGNOSIS — Z79899 Other long term (current) drug therapy: Secondary | ICD-10-CM | POA: Insufficient documentation

## 2022-01-21 DIAGNOSIS — Z7901 Long term (current) use of anticoagulants: Secondary | ICD-10-CM

## 2022-01-21 DIAGNOSIS — M5030 Other cervical disc degeneration, unspecified cervical region: Secondary | ICD-10-CM | POA: Insufficient documentation

## 2022-01-21 DIAGNOSIS — F17210 Nicotine dependence, cigarettes, uncomplicated: Secondary | ICD-10-CM | POA: Insufficient documentation

## 2022-01-21 DIAGNOSIS — I5022 Chronic systolic (congestive) heart failure: Secondary | ICD-10-CM

## 2022-01-21 DIAGNOSIS — E119 Type 2 diabetes mellitus without complications: Secondary | ICD-10-CM | POA: Insufficient documentation

## 2022-01-21 DIAGNOSIS — Z86718 Personal history of other venous thrombosis and embolism: Secondary | ICD-10-CM | POA: Insufficient documentation

## 2022-01-21 DIAGNOSIS — K7030 Alcoholic cirrhosis of liver without ascites: Secondary | ICD-10-CM | POA: Diagnosis not present

## 2022-01-21 DIAGNOSIS — Z8719 Personal history of other diseases of the digestive system: Secondary | ICD-10-CM | POA: Diagnosis not present

## 2022-01-21 DIAGNOSIS — Z95811 Presence of heart assist device: Secondary | ICD-10-CM | POA: Diagnosis not present

## 2022-01-21 DIAGNOSIS — F17200 Nicotine dependence, unspecified, uncomplicated: Secondary | ICD-10-CM

## 2022-01-21 DIAGNOSIS — E785 Hyperlipidemia, unspecified: Secondary | ICD-10-CM | POA: Insufficient documentation

## 2022-01-21 DIAGNOSIS — G4733 Obstructive sleep apnea (adult) (pediatric): Secondary | ICD-10-CM

## 2022-01-21 DIAGNOSIS — T827XXA Infection and inflammatory reaction due to other cardiac and vascular devices, implants and grafts, initial encounter: Secondary | ICD-10-CM

## 2022-01-21 DIAGNOSIS — Z9989 Dependence on other enabling machines and devices: Secondary | ICD-10-CM | POA: Diagnosis not present

## 2022-01-21 LAB — CBC
HCT: 41.2 % (ref 39.0–52.0)
Hemoglobin: 13.7 g/dL (ref 13.0–17.0)
MCH: 27.8 pg (ref 26.0–34.0)
MCHC: 33.3 g/dL (ref 30.0–36.0)
MCV: 83.6 fL (ref 80.0–100.0)
Platelets: 216 10*3/uL (ref 150–400)
RBC: 4.93 MIL/uL (ref 4.22–5.81)
RDW: 15.1 % (ref 11.5–15.5)
WBC: 9 10*3/uL (ref 4.0–10.5)
nRBC: 0 % (ref 0.0–0.2)

## 2022-01-21 LAB — COMPREHENSIVE METABOLIC PANEL
ALT: 16 U/L (ref 0–44)
AST: 21 U/L (ref 15–41)
Albumin: 4.2 g/dL (ref 3.5–5.0)
Alkaline Phosphatase: 108 U/L (ref 38–126)
Anion gap: 8 (ref 5–15)
BUN: 18 mg/dL (ref 8–23)
CO2: 23 mmol/L (ref 22–32)
Calcium: 9.2 mg/dL (ref 8.9–10.3)
Chloride: 106 mmol/L (ref 98–111)
Creatinine, Ser: 1.11 mg/dL (ref 0.61–1.24)
GFR, Estimated: >60 mL/min (ref >60)
Glucose, Bld: 148 mg/dL — ABNORMAL HIGH (ref 70–99)
Potassium: 4.4 mmol/L (ref 3.5–5.1)
Sodium: 137 mmol/L (ref 135–145)
Total Bilirubin: 1.2 mg/dL (ref 0.3–1.2)
Total Protein: 7 g/dL (ref 6.5–8.1)

## 2022-01-21 LAB — PROTIME-INR
INR: 2.1 — ABNORMAL HIGH (ref 0.8–1.2)
Prothrombin Time: 23.1 seconds — ABNORMAL HIGH (ref 11.4–15.2)

## 2022-01-21 LAB — LACTATE DEHYDROGENASE: LDH: 250 U/L — ABNORMAL HIGH (ref 98–192)

## 2022-01-21 NOTE — Progress Notes (Addendum)
VAD clinic follow-up note.  HF Cardiologist: Dr. Shirlee Latch  61 y.o. with history of nonischemic cardiomyopathy, RLE DVT, cirrhosis, smoking, and OSA returns for followup of CHF/LVAD placement.  Cardiomyopathy was diagnosed in 6/19 in [place], [place] at that time showed low output.  He was admitted to [facility] in 9/19 with low output HF and was started on milrinone and diuresed.  Unable to wean off milrinone.  He had a degree of RV failure, but this improved on milrinone.  Valvular heart disease also looked better with milrinone and diuresis.  On 08/03/18, he had Heartmate 3 LVAD placed.  Speed was optimized by ramp echo post-op.  Post-op course was relatively unremarkable.  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  No abscess on CT.  He was started on cefazolin IV for 6 wks.  BP-active meds decreased with low MAP.   He was admitted in 3/21 with subxiphoid abscess and cellulitis.  CT showed collection along the course of the driveline.  There was no evidence for sternal osteomyelitis.  Abscess was debrided, MSSA grew from wound cultures.  Wound vac was placed.  Patient was started on cefazolin, which was recently stopped after completing 8 wks.   Patient was re-admitted later in 3/21 with facial Zoster.  He was treated with acyclovir and this has resolved.   He was admitted in 6/21 with bleeding from the site of the subxiphoid abscess as well as cellulitis.  He required multiple units of PRBCs.  He went to the OR initially for I&D.  Blood and wound cultures grew MSSA.  He then went back to the OR for rectus flap over wound site (plastics).  He was started on cefazolin and rifabutin, has PICC.   He was admitted in 7/21 with GI bleeding, he had 6 units PRBCs.  EGD showed duodenal AVMs, treated with APC.  INR goal with warfarin lowered to 1.8-2.3.   Echo in 5/22 was a difficult study with EF 25-30%, mild LVH, aortic valve  difficult to visualize but appears to open with each beat, mildly dilated RV with moderately decreased systolic function.   Returns for 2 month follow-up. Has been feeling well. BP stable. Reports some shortness of breath with exertion which is chronic and has been stable. He takes breaks as needed. Able to complete ADLs without difficulty. No orthopnea, PND or leg edema. No dizziness, falls or bleeding issues. Weight down 7 lb from last visit and about 17 lb the last few months. He has cut back on carbohydrate intake.    Reports he is still smoking 1 cigarette a day. He is taking Chantix 1 mg BID.   Drive line dressing change completed today. Remains on Keflex TID per ID. He has follow up with ID in June.    [facility]. He is participating in PT once a week for degenerative disk disease in spine, neck and right shoulder. Planning to receive a steroid injection in his back and right shoulder on 03/24.      Labs (9/19): LDH 210, INR 2.17, WBCs 17.1 => 15, hgb 9.7, creatinine 0.72 Labs (10/19): K 4.3, creatinine 0.78 => 0.83, hgb 10.2 Labs (11/19): creatinine 0.79, hgb 10 Labs (2/20): K 3.9, creatinine 0.79 Labs (4/20): K 4, creatinine 1.15, hgb 11.4, LDH 199, INR 1.9 Labs (5/20): hgb 10.4, WBCs 8.5 Labs (6/20): K 4.2, creatinine 1.06, hgb 11.2  Labs (8/20): k 3.7, creatinine 1.0, hgb 11.5, LDH 186 Labs (10/20): K 3.5, creatinine 0.93, INR 2, LDH 221 Labs (12/20): hgb 15.6, K 3.7, creatinine 1.02, LDH 250 Labs (4/21): K 4.1, creatinine 0.86, hgb 11.1 Labs (6/21): K 4.1, creatinine 0.76, hgb 10, WBCs 11.2 Labs (8/21): hgb 10.6 Labs (10/21): hgb 13.2, creatinine 1.17 Labs (7/22): hgb 8, LDH 372, INR 1.9, creatinine 0.95 Labs (8/22): hgb 12.6 Labs (9/22): K 3.9, creatinine 1.1 Labs (11/22): creatinine 1.01 Labs (03/23): Scr 1.11, K 4.4, Hgb 13.7, LDH 250  PMH: 1. Degenerative disc disease.  2. GERD 3. Chronic systolic CHF:  Nonischemic  cardiomyopathy.  Dilated cardiomyopathy diagnosed 6/19 in [place].  LHC/RHC in 7/19 showed elevated filling pressures, low cardiac output, and no significant CAD.   - Echo (9/19): Severe LV dilation with EF 10-20%, moderate-severe MR, severely dilated RV with mildly decreased systolic function, severe TR.  - Cardiac MRI (9/19): EF 14%, moderate dilated LV, severely dilated RV with mod-severe systolic function and EF 25%, nonspecific RV insertion site LGE.  - RHC (5/19) on milrinone 0.375: mean RA 8, PA 40/19, mean PCWP 12, PAPi 2.65, CI 2.71.  - Echo (9/19) on milrinone and diuresed): EF 20-25% with moderate LV dilation, moderately dilated RV with mildly decreased systolic function, mild-moderate MR, moderate TR, PASP 51 mmHg.  Cannot rule out noncompaction.  - Heartmate 3 LVAD placement in 9/19.  - Echo (5/22): EF 25-30%, mild LVH, aortic valve difficult to visualize but appears to open with each beat, mildly dilated RV with moderately decreased systolic function. 4. OSA: CPAP use.  5. Smoker 6. Type 2 diabetes 7. Hyperlipidemia.  8. H/o NSVT 9. 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  10. RLE DVT: found in 9/19.  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 Subxiphoid abscess involving collection along driveline in 3/21, MSSA.  6/21 Rectus flap to cover abscess site.  12. Facial Zoster 3/21 13. 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   Current Outpatient Medications  Medication Sig Dispense Refill   acetaminophen (TYLENOL) 500 MG tablet Take 1,000 mg by mouth every 6 (six) hours as needed for mild pain.     carbamide peroxide (DEBROX) 6.5 % OTIC solution Place 5 drops into both ears 3 (three) times daily. 15 mL 0   carvedilol (COREG) 6.25 MG tablet Take 1 tablet (6.25 mg total) by mouth 2 (two) times daily with a meal. 180 tablet 3   cephALEXin (KEFLEX) 500 MG capsule Take 1 capsule (500 mg total) by mouth 3 (three)  times daily. 90 capsule 11   cyanocobalamin 500 MCG tablet Take 1,000 mcg by mouth daily.      docusate sodium (COLACE) 100 MG capsule Take 2 capsules (200 mg total) by mouth daily as needed for mild constipation. 10 capsule 0   fluticasone (FLONASE) 50 MCG/ACT nasal spray Place 1 spray into both nostrils 2 (two) times daily as needed for allergies. 9.9 mL 5   gabapentin (NEURONTIN) 300 MG capsule Take 2 capsules (600 mg total) by mouth 4 (four) times daily. 240 capsule 5   hydrALAZINE (APRESOLINE) 100 MG tablet Take 1 tablet (100 mg total) by mouth 3 (three) times daily. 90 tablet 3   hydrOXYzine (ATARAX/VISTARIL) 50 MG tablet Take 1 tablet (50 mg total) by mouth 3 (three) times daily as needed for itching. 60 tablet 3   Ipratropium-Albuterol (COMBIVENT) 20-100 MCG/ACT AERS respimat Inhale 1 puff into the lungs every 6 (six)  hours as needed for wheezing or shortness of breath. 4 g 5   nystatin (MYCOSTATIN/NYSTOP) powder Apply 1 application topically 3 (three) times daily. 15 g 3   pantoprazole (PROTONIX) 40 MG tablet Take 1 tablet (40 mg total) by mouth daily. Take 40 mg twice daily for 8 weeks (2 months) and then back down to 40 mg daily (Patient taking differently: Take 40 mg by mouth daily.) 90 tablet 3   sacubitril-valsartan (ENTRESTO) 97-103 MG Take 1 tablet by mouth 2 (two) times daily. 180 tablet 3   sildenafil (VIAGRA) 100 MG tablet Take 0.5 tablets (50 mg total) by mouth daily as needed for erectile dysfunction. 10 tablet 6   spironolactone (ALDACTONE) 25 MG tablet Take 2 tablets (50 mg total) by mouth daily. 180 tablet 3   thiamine 100 MG tablet Take 1 tablet (100 mg total) by mouth daily. 30 tablet 0   varenicline (CHANTIX CONTINUING MONTH PAK) 1 MG tablet Take 1 tablet (1 mg total) by mouth 2 (two) times daily. 60 tablet 3   warfarin (COUMADIN) 5 MG tablet Take 2.5 mg (1/2 tab) every Tuesday and 5 mg (1 tablet) all other days or as instructed by HF Clinic 100 tablet 3   Zinc Gluconate  100 MG TABS Take 1 tablet (100 mg total) by mouth daily. 90 tablet 3   zolpidem (AMBIEN) 5 MG tablet Take 1 tablet (5 mg total) by mouth at bedtime. 30 tablet 3   No current facility-administered medications for this encounter.    Chlorhexidine and Other  REVIEW OF SYSTEMS: All systems negative except as listed in HPI, PMH and Problem list.   LVAD INTERROGATION:  Please see LVAD nurse's note from today for details.  I reviewed the LVAD parameters from today, and compared the results to the patient's prior recorded data.  No programming changes were made.  The LVAD is functioning within specified parameters.  The patient performs LVAD self-test daily.  LVAD interrogation was negative for any significant power changes, alarms or PI events/speed drops.  LVAD equipment check completed and is in good working order.  Back-up equipment present.   LVAD education done on emergency procedures and precautions and reviewed exit site care.   Doppler Pressure 132 Automatic BP 120/74 (96) HR 72 SPO2 100% on RA  Weight 202.6 lb w/o eqt Last weight 209.2 lb  VAD interrogation & Equipment Management: Speed: 5800 Flow: 4.5 Power:  4.6 w    PI: 5.0 Hct: 32   Alarms: none Events: 30 - 60 PI events daily Fixed speed: 5800 Low speed limit: 5500  Physical Exam: Physical Exam: GENERAL: Well appearing, male who presents to clinic today in no acute distress. Wife present HEENT: normal  NECK: Supple, JVP 7-8 cm.  2+ bilaterally, no bruits.   CARDIAC:  Mechanical heart sounds with LVAD hum present.  LUNGS:  Clear to auscultation bilaterally.  ABDOMEN:  Soft, round, nontender, positive bowel sounds x4.     LVAD exit site: well-healed and incorporated.  Dressing dry and intact.  No erythema or drainage.  Stabilization device present and accurately applied.  Driveline dressing is being changed daily per sterile technique. EXTREMITIES:  Warm and dry, no cyanosis, clubbing, rash or edema  NEUROLOGIC:   Alert and oriented x 4.  Gait steady.  No aphasia.  No dysarthria.  Affect pleasant.        ASSESSMENT AND PLAN:   1. 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  LVAD parameters  stable.  Doppler pressure 132, reflecting modified systolic. No changes today.  NYHA class II.   He does not look volume overloaded. Stable LVAD parameters, has stable but frequent PIs.  - He does not need Lasix.  Stay hydrated.  - Continue spironolactone 50 mg daily, BMET and CBC today.  - Continue Entresto 97/103 bid.   - Continue hydralazine 100 mg tid.  - Continue Coreg 6.25 mg bid.  - He is off ASA with GI bleeding.  - Continue warfarin for INR 1.8-2.3.    - Should be transplant candidate eventually if he can quit smoking.  Will make transplant clinic referral when he has quit completely. 2. Smoking: He has cut back a lot but still smoking.  Continue Chantix.   3. RLE DVT: On warfarin for LVAD.  4. OSA: Continue CPAP.  5. Hyperlipidemia: Atorvastatin.  6. Type II diabetes: Per PCP.  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  Site improved.  - He will have long-term Keflex for MSSA suppression.  Followup with ID as planned in June. 8. 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 No overt bleeding.   - Off ASA, INR goal now 1.8-2.3. Today's INR pending. - Continue monthly octreotide injections.  - CBC today  Followup in 2 months  Orien Mayhall N 01/21/2022

## 2022-01-21 NOTE — Progress Notes (Addendum)
Patient presents for 2 mo f/u and 3.5 year Intermacs in VAD Clinic today with his wife Theodore Welch. Reports no problems with VAD equipment or concerns with drive line. ? ?Pt reports he has been feeling great. Denies lightheadedness, dizziness, falls, and signs of bleeding. Reports he has been watching his diet, avoiding carbs/starches as much as he can. He has lost another 7 lbs (17 lbs total) over the last few months. Encouraged him to keep up the great work!  ? ?Reports he is still smoking a few cigarettes a day. He started Chantix as instructed last visit; he is now taking 1 mg bid.  ? ?Drive line dressing change completed today as documented below. Remains on Keflex TID per ID. He has follow up with ID in June.  ? ?[facility]. Plan for steroid injection in his back and right shoulder on 02/08/22. He is currently participating in outpatient physical therapy once a week for his degenerative disc disease in his back, neck, and right shoulder.  ? ?Vital Signs: ?Temp:  ?Doppler Pressure: 132 ?Automatc BP: 120/74 (96) ?HR: 72 ?SPO2: 100% RA ?  ?Weight: 202.6 lb w/o eqt ?Last weight: 209.2 lb ? ?VAD Indication: ?Destination Therapy due to smoking status ?  ?VAD interrogation & Equipment Management: ?Speed: 5800 ?Flow: 4.5 ?Power:  4.6 w    ?PI: 5.0 ?Hct: 32 ?  ?Alarms: none ?Events: 30 - 60 PI events daily ?Fixed speed: 5800 ?Low speed limit: 5500 ? ?Primary Controller:  Replace back up battery in 27 months. ?Back up controller:   Replace back up battery in 21 months. -- did not bring today ?  ?Annual Equipment Maintenance on UBC/PM was performed on 07/30/21. ? ?I reviewed the LVAD parameters from today and compared the results to the patient's prior recorded data. LVAD interrogation was NEGATIVE for significant power changes, NEGATIVE for clinical alarms and NEGATIVE for PI events/speed drops. No programming changes were made and pump is functioning within specified parameters.  Pt is performing daily controller and system monitor self tests along with completing weekly and monthly maintenance for LVAD equipment. ? ?LVAD equipment check completed and is in good working order. Back-up equipment present.  ? ?Abdominal incision care: ?Both upper and lower wounds closed, no drainage noted. Site cleansed daily per [name]. Both are open to air.  ? ?Exit Site Care: ?Drive line is being maintained weekly by [name]. Gauze dressing with anchor intact and accurately applied. Existing VAD dressing removed and site care performed using sterile technique. Drive line exit site cleaned with betadine swabs x 2, allowed to dry, and gauze dressing with NO SILVER applied. Exit site healed and incorporated, the velour is fully implanted at exit site. No redness, tenderness, drainage, foul odor or rash noted. Drive line anchor correctly applied. Pt denies fever or chills. Continue weekly dressing changes. Pt provided with 7 daily kits, 10 anchors, saline wipes, and adhesive remover for home use.  ? ?Device: N/A ?  ?BP & Labs:  ?Doppler 132- Doppler is reflecting modified systolic ?  ?Hgb 13.7 - No S/S of bleeding. Specifically denies BRBPR.  ?  ?LDH stable at 250 with established baseline of 150 - 220. Denies tea-colored urine. No power elevations noted on interrogation.  ? ?2.5 year Intermacs follow up completed including:  Quality of Life, KCCQ-12, and [employer] trail making.  ? ?Pt completed 1125 feet during 6 minute walk. ? ?Back up controller: ?Did not bring back up equipment today ? ?  Patient Goals: ?Pt would like to quit smoking entirely over the next 6 months. He is currently using Chantix, and he feels like this is helping.  ? ?Patient Instructions: ?No medication changes today ?Coumadin dosing per Theodore Welch PharmD ?Return to VAD clinic in 2 months ? ?Alyce Pagan RN ?VAD Coordinator  ?Office: [phone]  ?24/7 Pager: [phone]  ?

## 2022-01-21 NOTE — Addendum Note (Signed)
Encounter addended by: Mertha Baars, RN on: 01/21/2022 1:54 PM ? Actions taken: Clinical Note Signed

## 2022-01-21 NOTE — Progress Notes (Signed)
LVAD INR 

## 2022-01-21 NOTE — Patient Instructions (Signed)
No medication changes today ?Coumadin dosing per [name] PharmD ?Return to VAD clinic in 2 months for follow up with Dr Shirlee Latch ?

## 2022-01-28 ENCOUNTER — Ambulatory Visit (HOSPITAL_COMMUNITY): Payer: Self-pay | Admitting: Pharmacist

## 2022-01-28 LAB — POCT INR: INR: 2 (ref 2.0–3.0)

## 2022-01-28 NOTE — Progress Notes (Signed)
LVAD INR 

## 2022-01-30 ENCOUNTER — Encounter (HOSPITAL_COMMUNITY)
Admission: RE | Admit: 2022-01-30 | Discharge: 2022-01-30 | Disposition: A | Discharge status: Home / Self Care | Payer: Medicare HMO | Source: Ambulatory Visit | Attending: Cardiology | Admitting: Cardiology

## 2022-01-30 ENCOUNTER — Other Ambulatory Visit (HOSPITAL_COMMUNITY): Payer: Self-pay | Admitting: *Deleted

## 2022-01-30 DIAGNOSIS — K31811 Angiodysplasia of stomach and duodenum with bleeding: Secondary | ICD-10-CM | POA: Insufficient documentation

## 2022-01-30 DIAGNOSIS — M797 Fibromyalgia: Secondary | ICD-10-CM

## 2022-01-30 DIAGNOSIS — Z95811 Presence of heart assist device: Secondary | ICD-10-CM | POA: Insufficient documentation

## 2022-01-30 DIAGNOSIS — I5022 Chronic systolic (congestive) heart failure: Secondary | ICD-10-CM

## 2022-01-30 MED ORDER — OCTREOTIDE ACETATE 20 MG IM KIT
20.0000 mg | PACK | INTRAMUSCULAR | Status: DC
  Administered 2022-01-30: 20 mg via INTRAMUSCULAR
  Filled 2022-01-30: qty 1

## 2022-01-30 MED ORDER — GABAPENTIN 300 MG PO CAPS: 600.0000 mg | ORAL_CAPSULE | Freq: Four times a day (QID) | ORAL | 8 refills | Status: DC

## 2022-01-30 MED ORDER — PANTOPRAZOLE SODIUM 40 MG PO TBEC: 40.0000 mg | DELAYED_RELEASE_TABLET | Freq: Every day | ORAL | 3 refills | Status: DC

## 2022-02-04 ENCOUNTER — Ambulatory Visit (HOSPITAL_COMMUNITY): Payer: Self-pay | Admitting: Pharmacist

## 2022-02-04 LAB — POCT INR: INR: 2.2 (ref 2.0–3.0)

## 2022-02-04 NOTE — Progress Notes (Signed)
LVAD INR 

## 2022-02-12 ENCOUNTER — Ambulatory Visit (HOSPITAL_COMMUNITY): Payer: Self-pay | Admitting: Pharmacist

## 2022-02-12 LAB — POCT INR: INR: 1.9 — AB (ref 2.0–3.0)

## 2022-02-12 NOTE — Progress Notes (Signed)
LVAD INR 

## 2022-02-18 ENCOUNTER — Ambulatory Visit (HOSPITAL_COMMUNITY): Payer: Self-pay | Admitting: Pharmacist

## 2022-02-18 LAB — POCT INR: INR: 2 (ref 2.0–3.0)

## 2022-02-18 NOTE — Progress Notes (Signed)
LVAD INR 

## 2022-02-22 ENCOUNTER — Other Ambulatory Visit (HOSPITAL_COMMUNITY): Payer: Self-pay | Admitting: *Deleted

## 2022-02-25 ENCOUNTER — Ambulatory Visit (HOSPITAL_COMMUNITY): Payer: Self-pay | Admitting: Pharmacist

## 2022-02-25 LAB — POCT INR: INR: 2 (ref 2.0–3.0)

## 2022-02-25 NOTE — Progress Notes (Signed)
LVAD INR 

## 2022-02-27 ENCOUNTER — Ambulatory Visit (HOSPITAL_COMMUNITY)
Admission: RE | Admit: 2022-02-27 | Discharge: 2022-02-27 | Disposition: A | Discharge status: Home / Self Care | Payer: Medicare HMO | Source: Ambulatory Visit | Attending: Cardiology | Admitting: Cardiology

## 2022-02-27 DIAGNOSIS — Z95811 Presence of heart assist device: Secondary | ICD-10-CM | POA: Diagnosis present

## 2022-02-27 DIAGNOSIS — K31811 Angiodysplasia of stomach and duodenum with bleeding: Secondary | ICD-10-CM

## 2022-02-27 MED ORDER — OCTREOTIDE ACETATE 20 MG IM KIT
20.0000 mg | PACK | INTRAMUSCULAR | Status: DC
  Administered 2022-02-27: 20 mg via INTRAMUSCULAR
  Filled 2022-02-27: qty 1

## 2022-03-04 ENCOUNTER — Ambulatory Visit (HOSPITAL_COMMUNITY): Payer: Self-pay | Admitting: Pharmacist

## 2022-03-04 LAB — POCT INR: INR: 2 (ref 2.0–3.0)

## 2022-03-04 NOTE — Progress Notes (Signed)
LVAD INR 

## 2022-03-06 ENCOUNTER — Other Ambulatory Visit: Payer: Self-pay | Admitting: Infectious Diseases

## 2022-03-11 ENCOUNTER — Ambulatory Visit (HOSPITAL_COMMUNITY): Payer: Self-pay | Admitting: Pharmacist

## 2022-03-11 LAB — POCT INR: INR: 2 (ref 2.0–3.0)

## 2022-03-11 NOTE — Progress Notes (Signed)
LVAD INR 

## 2022-03-18 ENCOUNTER — Ambulatory Visit (HOSPITAL_COMMUNITY): Payer: Self-pay | Admitting: Pharmacist

## 2022-03-18 LAB — POCT INR: INR: 2 (ref 2.0–3.0)

## 2022-03-18 NOTE — Progress Notes (Signed)
LVAD INR 

## 2022-03-22 ENCOUNTER — Other Ambulatory Visit (HOSPITAL_COMMUNITY): Payer: Self-pay | Admitting: Unknown Physician Specialty

## 2022-03-22 DIAGNOSIS — Z7901 Long term (current) use of anticoagulants: Secondary | ICD-10-CM

## 2022-03-22 DIAGNOSIS — Z95811 Presence of heart assist device: Secondary | ICD-10-CM

## 2022-03-25 ENCOUNTER — Other Ambulatory Visit (HOSPITAL_COMMUNITY): Payer: Self-pay | Admitting: *Deleted

## 2022-03-25 ENCOUNTER — Encounter (HOSPITAL_COMMUNITY): Payer: Self-pay

## 2022-03-25 ENCOUNTER — Ambulatory Visit (HOSPITAL_COMMUNITY)
Admission: RE | Admit: 2022-03-25 | Discharge: 2022-03-25 | Disposition: A | Discharge status: Home / Self Care | Payer: Medicare HMO | Source: Ambulatory Visit | Attending: Internal Medicine | Admitting: Internal Medicine

## 2022-03-25 ENCOUNTER — Ambulatory Visit (HOSPITAL_COMMUNITY): Payer: Self-pay | Admitting: Pharmacist

## 2022-03-25 VITALS — BP 104/0 | HR 58 | Temp 98.2°F | Wt 202.8 lb

## 2022-03-25 DIAGNOSIS — Z95811 Presence of heart assist device: Secondary | ICD-10-CM

## 2022-03-25 DIAGNOSIS — Z86718 Personal history of other venous thrombosis and embolism: Secondary | ICD-10-CM | POA: Diagnosis not present

## 2022-03-25 DIAGNOSIS — I420 Dilated cardiomyopathy: Secondary | ICD-10-CM | POA: Diagnosis not present

## 2022-03-25 DIAGNOSIS — I5022 Chronic systolic (congestive) heart failure: Secondary | ICD-10-CM | POA: Diagnosis not present

## 2022-03-25 DIAGNOSIS — G4733 Obstructive sleep apnea (adult) (pediatric): Secondary | ICD-10-CM | POA: Insufficient documentation

## 2022-03-25 DIAGNOSIS — F17200 Nicotine dependence, unspecified, uncomplicated: Secondary | ICD-10-CM | POA: Insufficient documentation

## 2022-03-25 DIAGNOSIS — Z7901 Long term (current) use of anticoagulants: Secondary | ICD-10-CM | POA: Insufficient documentation

## 2022-03-25 DIAGNOSIS — K7460 Unspecified cirrhosis of liver: Secondary | ICD-10-CM | POA: Diagnosis not present

## 2022-03-25 LAB — CBC
HCT: 39.7 % (ref 39.0–52.0)
Hemoglobin: 13.3 g/dL (ref 13.0–17.0)
MCH: 28.7 pg (ref 26.0–34.0)
MCHC: 33.5 g/dL (ref 30.0–36.0)
MCV: 85.7 fL (ref 80.0–100.0)
Platelets: 180 10*3/uL (ref 150–400)
RBC: 4.63 MIL/uL (ref 4.22–5.81)
RDW: 16.2 % — ABNORMAL HIGH (ref 11.5–15.5)
WBC: 7.9 10*3/uL (ref 4.0–10.5)
nRBC: 0 % (ref 0.0–0.2)

## 2022-03-25 LAB — BASIC METABOLIC PANEL
Anion gap: 10 (ref 5–15)
BUN: 11 mg/dL (ref 8–23)
CO2: 21 mmol/L — ABNORMAL LOW (ref 22–32)
Calcium: 8.9 mg/dL (ref 8.9–10.3)
Chloride: 109 mmol/L (ref 98–111)
Creatinine, Ser: 0.92 mg/dL (ref 0.61–1.24)
GFR, Estimated: >60 mL/min (ref >60)
Glucose, Bld: 143 mg/dL — ABNORMAL HIGH (ref 70–99)
Potassium: 4.4 mmol/L (ref 3.5–5.1)
Sodium: 140 mmol/L (ref 135–145)

## 2022-03-25 LAB — LACTATE DEHYDROGENASE: LDH: 214 U/L — ABNORMAL HIGH (ref 98–192)

## 2022-03-25 LAB — PROTIME-INR
INR: 2.3 — ABNORMAL HIGH (ref 0.8–1.2)
Prothrombin Time: 25.4 seconds — ABNORMAL HIGH (ref 11.4–15.2)

## 2022-03-25 NOTE — Progress Notes (Signed)
LVAD INR 

## 2022-03-25 NOTE — Progress Notes (Addendum)
Patient presents for 2 mo f/u in VAD Clinic today with his wife [name]. Reports no problems with VAD equipment or concerns with drive line. ? ?Pt reports he has been feeling great. Denies lightheadedness, dizziness, falls, heart failure symptoms, and signs of bleeding. Reports he noticed some shortness of breath when bending over last week but this has since resolved. He has been watching his diet, avoiding carbs/starches as much as he can. Weight has maintained since last visit.  ? ?Reports he is still smoking a few cigarettes a day. He takes Chantix when he remembers to take it.  ? ?Drive line dressing change completed today as documented below. Remains on Keflex TID per ID. He has follow up with ID in June.  ? 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 He will be picking up new hearing aides at the end of the month.  ? ?Receiving monthly Octreotide injections.  ? ?[facility]. Plan for steroid injection in his back and right shoulder 03/26/22. He is completed outpatient physical therapy for his degenerative disc disease in his back, neck, and right shoulder.  ? ?Per Dr Shirlee Latch will scheduled complete echo with next visit.  ? ?Vital Signs: ?Temp: 98.2 ?Doppler Pressure: 104 ?Automatc BP: 106/73 (89) ?HR: 58 SB ?SPO2: 98% RA ?  ?Weight: 202.8 lb w/o eqt ?Last weight: 202.6 lb ? ?VAD Indication: ?Destination Therapy due to smoking status ?  ?VAD interrogation & Equipment Management: ?Speed: 5800 ?Flow: 5.0 ?Power:  4.7 w    ?PI: 3.9 ?Hct: 41 ?  ?Alarms: none ?Events: 30 - 60 PI events daily ?Fixed speed: 5800 ?Low speed limit: 5500 ? ?Primary Controller:  Replace back up battery in 25 months. ?Back up controller:   Replace back up battery in 21 months. -- did not bring today ?  ?Annual Equipment Maintenance on UBC/PM was performed on 07/30/21. ? ?I reviewed the LVAD parameters from today and compared the results to the patient's prior recorded  data. LVAD interrogation was NEGATIVE for significant power changes, NEGATIVE for clinical alarms and NEGATIVE for PI events/speed drops. No programming changes were made and pump is functioning within specified parameters. Pt is performing daily controller and system monitor self tests along with completing weekly and monthly maintenance for LVAD equipment. ? ?LVAD equipment check completed and is in good working order. Back-up equipment present.  ? ?Abdominal incision care: ?Both upper and lower wounds closed, no drainage noted. Site cleansed daily per [place]. Both are open to air.  ? ?Exit Site Care: ?Drive line is being maintained weekly by [name]. Gauze dressing with anchor intact and accurately applied. Existing VAD dressing removed and site care performed using sterile technique. Drive line exit site cleaned with betadine swabs x 2, allowed to dry, and gauze dressing with NO SILVER applied. Exit site healed and incorporated, the velour is fully implanted at exit site. Scant amount of dried brown drainage noted at exit site. No redness, tenderness, foul odor or rash noted. Drive line anchor reapplied. Pt denies fever or chills. Continue weekly dressing changes. Pt provided with 7 daily kits, 10 anchors, and adhesive remover for home use.  ? ?Device: N/A ?  ?BP & Labs:  ?Doppler 104- Doppler is reflecting modified systolic ?  ?Hgb 13.3 - No S/S of bleeding. Specifically denies BRBPR.  ?  ?LDH stable at 214 with established baseline of 150 - 220. Denies tea-colored urine. No power elevations noted on  interrogation.  ? ? ?Patient Instructions: ?No medication changes today ?Coumadin dosing per [name] PharmD ?Return to VAD clinic in 2 months ? ?Alyce Pagan RN ?VAD Coordinator  ?Office: [phone]  ?24/7 Pager: [phone]  ? ? ?61 y.o. with history of nonischemic cardiomyopathy, RLE DVT, cirrhosis, smoking, and OSA returns for followup of CHF/LVAD placement.  Cardiomyopathy was diagnosed in 6/19 in  [place], [place] at that time showed low output.  He was admitted to [facility] in 9/19 with low output HF and was started on milrinone and diuresed.  Unable to wean off milrinone.  He had a degree of RV failure, but this improved on milrinone.  Valvular heart disease also looked better with milrinone and diuresis.  On 08/03/18, he had Heartmate 3 LVAD placed.  Speed was optimized by ramp echo post-op.  Post-op course was relatively unremarkable. ? ?He was admitted in 1/20 with MSSA driveline infection.   ? ?He was admitted again 5/20 with recurrent MSSA driveline infection.  No abscess on CT.  He was started on cefazolin IV for 6 wks.  BP-active meds decreased with low MAP.  ? ?He was admitted in 3/21 with subxiphoid abscess and cellulitis.  CT showed collection along the course of the driveline.  There was no evidence for sternal osteomyelitis.  Abscess was debrided, MSSA grew from wound cultures.  Wound vac was placed.  Patient was started on cefazolin, which was recently stopped after completing 8 wks.  ? ?Patient was re-admitted later in 3/21 with facial Zoster.  He was treated with acyclovir and this has resolved.  ? ?He was admitted in 6/21 with bleeding from the site of the subxiphoid abscess as well as cellulitis.  He required multiple units of PRBCs.  He went to the OR initially for I&D.  Blood and wound cultures grew MSSA.  He then went back to the OR for rectus flap over wound site (plastics).  He was started on cefazolin and rifabutin, has PICC.  ? ?He was admitted in 7/21 with GI bleeding, he had 6 units PRBCs.  EGD showed duodenal AVMs, treated with APC.  INR goal with warfarin lowered to 1.8-2.3.  ? ?Echo in 5/22 was a difficult study with EF 25-30%, mild LVH, aortic valve difficult to visualize but appears to open with each beat, mildly dilated RV with moderately decreased systolic function.  ? ?He returns for LVAD followup.   Still smoking 2-3 cigarettes/day.  No driveline drainage.  BP controlled on  current regimen. Weight stable.  Active, goes to stores, etc.  No significant exertional dyspnea with his usual activities.   ? ?Denies LVAD alarms.  Reports taking Coumadin as prescribed and adherence to anticoagulation based dietary restrictions.  Denies bright red blood per rectum or melena, no dark urine or hematuria.   ? ?Labs (9/19): LDH 210, INR 2.17, WBCs 17.1 => 15, hgb 9.7, creatinine 0.72 ?Labs (10/19): K 4.3, creatinine 0.78 => 0.83, hgb 10.2 ?Labs (11/19): creatinine 0.79, hgb 10 ?Labs (2/20): K 3.9, creatinine 0.79 ?Labs (4/20): K 4, creatinine 1.15, hgb 11.4, LDH 199, INR 1.9 ?Labs (5/20): hgb 10.4, WBCs 8.5 ?Labs (6/20): K 4.2, creatinine 1.06, hgb 11.2 ?Labs (8/20): k 3.7, creatinine 1.0, hgb 11.5, LDH 186 ?Labs (10/20): K 3.5, creatinine 0.93, INR 2, LDH 221 ?Labs (12/20): hgb 15.6, K 3.7, creatinine 1.02, LDH 250 ?Labs (4/21): K 4.1, creatinine 0.86, hgb 11.1 ?Labs (6/21): K 4.1, creatinine 0.76, hgb 10, WBCs 11.2 ?Labs (8/21): hgb 10.6 ?Labs (10/21): hgb 13.2, creatinine 1.17 ?Labs (  7/22): hgb 8, LDH 372, INR 1.9, creatinine 0.95 ?Labs (8/22): hgb 12.6 ?Labs (9/22): K 3.9, creatinine 1.1 ?Labs (11/22): creatinine 1.01 ?Labs (5/23): K 4.4, creatinine 0.72, hgb 13.3, LDH 214 ? ?PMH: ?1. Degenerative disc disease.  ?2. GERD ?3. Chronic systolic CHF:  Nonischemic cardiomyopathy.  Dilated cardiomyopathy diagnosed 6/19 in [place].  LHC/RHC in 7/19 showed elevated filling pressures, low cardiac output, and no significant CAD.   ?- Echo (9/19): Severe LV dilation with EF 10-20%, moderate-severe MR, severely dilated RV with mildly decreased systolic function, severe TR.  ?- Cardiac MRI (9/19): EF 14%, moderate dilated LV, severely dilated RV with mod-severe systolic function and EF 25%, nonspecific RV insertion site LGE.  ?- RHC (5/19) on milrinone 0.375: mean RA 8, PA 40/19, mean PCWP 12, PAPi 2.65, CI 2.71.  ?- Echo (9/19) on milrinone and diuresed): EF 20-25% with moderate LV dilation, moderately  dilated RV with mildly decreased systolic function, mild-moderate MR, moderate TR, PASP 51 mmHg.  Cannot rule out noncompaction.  ?- Heartmate 3 LVAD placement in 9/19.  ?- Echo (5/22): EF 25-30%, mild LVH, aortic valve d

## 2022-03-25 NOTE — Addendum Note (Signed)
Encounter addended by: Laurey Morale, MD on: 03/25/2022 5:24 PM ? Actions taken: Level of Service modified, Visit diagnoses modified

## 2022-03-25 NOTE — Patient Instructions (Addendum)
No medication changes today Coumadin dosing per Lauren PharmD Return to VAD clinic in 2 months 

## 2022-03-27 ENCOUNTER — Ambulatory Visit (HOSPITAL_COMMUNITY)
Admission: RE | Admit: 2022-03-27 | Discharge: 2022-03-27 | Disposition: A | Discharge status: Home / Self Care | Payer: Medicare HMO | Source: Ambulatory Visit | Attending: Cardiology | Admitting: Cardiology

## 2022-03-27 DIAGNOSIS — Z95811 Presence of heart assist device: Secondary | ICD-10-CM | POA: Insufficient documentation

## 2022-03-27 DIAGNOSIS — K31811 Angiodysplasia of stomach and duodenum with bleeding: Secondary | ICD-10-CM | POA: Diagnosis present

## 2022-03-27 MED ORDER — OCTREOTIDE ACETATE 20 MG IM KIT
20.0000 mg | PACK | INTRAMUSCULAR | Status: DC
  Administered 2022-03-27: 20 mg via INTRAMUSCULAR
  Filled 2022-03-27: qty 1

## 2022-04-02 ENCOUNTER — Ambulatory Visit (HOSPITAL_COMMUNITY): Payer: Self-pay | Admitting: Pharmacist

## 2022-04-02 LAB — POCT INR: INR: 2 (ref 2.0–3.0)

## 2022-04-02 NOTE — Progress Notes (Signed)
LVAD INR 

## 2022-04-08 ENCOUNTER — Ambulatory Visit (HOSPITAL_COMMUNITY): Payer: Self-pay | Admitting: Pharmacist

## 2022-04-08 LAB — POCT INR: INR: 1.9 — AB (ref 2.0–3.0)

## 2022-04-08 NOTE — Progress Notes (Signed)
LVAD INR 

## 2022-04-16 ENCOUNTER — Ambulatory Visit (HOSPITAL_COMMUNITY): Payer: Self-pay | Admitting: Pharmacist

## 2022-04-16 LAB — POCT INR: INR: 2 (ref 2.0–3.0)

## 2022-04-16 NOTE — Progress Notes (Signed)
LVAD INR 

## 2022-04-19 NOTE — Progress Notes (Signed)
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  No concerns today - his wife, [name] joins him.     ID Hx: RESHAD SAAB is a 61 y.o. 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   January-2020: MSSA infection of counter incision to mid abdomen; s/p serial debridements. Initially did not involve exit site at this time. Received IV Cefazolin x 4 weeks --> PO Cephalexin x 4 weeks.  May-2020: recurrent MSSA infection, now involving driveline exit site and secondary bacteremia. TEE deferred due to low sensitivity for detecting VAD endocarditis. 6 weeks IV Cefazolin s/p abd wall debridement --> cephalexin for chronic suppression for duration pump is in place.   March-2021 sudden onset subxiphoid abscess involving proximal driveline, MSSA on cultures. Blood cultures negative. S/P I&D of abdominal wall exit site and subxiphoid space. IV cefazolin x 8 weeks (May 10th 2021).  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   Apr 16, 2020 - readmitted following re-opening and bleeding of subxiphoid wound. +MSSA and taken for debridement 6/01. Required repair of outflow graft at this surgery as well (?sharp puncture vs degenerated graft d/t infection). Vertical Rectus Flap repair performed with serial debridements. BC (-). PICC Cefazolin + Rifabutin planned through 7/7  April 27, 2020 - bleeding from sternum r/t subxiphoid abscess --> debridement in OR, Cx again grew out MSSA. Back on IV cefazolin for treatment.   March 2022 - enterobacter cloacae growing from non-healing soft tissue wound. Added 2 weeks cipro to treat --> Resolved. Has  continued on cephalexin.     HPI: Theodore Welch is doing very well. No concerns or complaints today. Takes his keflex TID as prescribed - sometimes the second dose is delayed a few hours but he does well with adherence. The two spots on the mid abdominal scar are much smaller than they have been in the past but the top still looks like a dimple. No drainage or any signs of infection here. The exit site has some intermittent scant drainage from it but nothing that wicks to the gauze.   Wondering if he should get the RSV vaccine when available - he has young grand children he is around frequently.   Follow with LVAD team regularly - gets octreotide injections to help with GIB. No interval hospitalizations since LOV with me.  Working with sports medicine doctor and received steroid injections in spine and right shoulder 03/26/22.    Review of Systems  Constitutional:  Negative for chills and fever.  HENT:  Negative for tinnitus.   Eyes:  Negative for blurred vision and photophobia.  Respiratory:  Negative for cough and sputum production.   Cardiovascular:  Negative for chest pain.  Gastrointestinal:  Negative for diarrhea, nausea and vomiting.  Genitourinary:  Negative for dysuria.  Skin:  Negative for rash.  Neurological:  Negative for headaches.     Past Medical History:  Diagnosis Date   Abscess 01/2020   sternal abscess   Cardiomyopathy, unspecified (HCC)    CHF (congestive heart failure) (HCC)    Chronic back pain    Diabetes mellitus without complication (HCC)    Enlarged heart    Gastric ulcer  Gastroenteritis    H/O degenerative disc disease    Hypertension    LVAD (left ventricular assist device) present [facility])     Outpatient Medications Prior to Visit  Medication Sig Dispense Refill   acetaminophen (TYLENOL) 500 MG tablet Take 1,000 mg by mouth every 6 (six) hours as needed for mild pain.     carbamide peroxide (DEBROX) 6.5 % OTIC solution Place 5 drops into both ears 3  (three) times daily. 15 mL 0   carvedilol (COREG) 6.25 MG tablet Take 1 tablet (6.25 mg total) by mouth 2 (two) times daily with a meal. 180 tablet 3   cyanocobalamin 500 MCG tablet Take 1,000 mcg by mouth daily.      docusate sodium (COLACE) 100 MG capsule Take 2 capsules (200 mg total) by mouth daily as needed for mild constipation. 10 capsule 0   fluticasone (FLONASE) 50 MCG/ACT nasal spray Place 1 spray into both nostrils 2 (two) times daily as needed for allergies. 9.9 mL 5   gabapentin (NEURONTIN) 300 MG capsule Take 2 capsules (600 mg total) by mouth 4 (four) times daily. 240 capsule 8   hydrALAZINE (APRESOLINE) 100 MG tablet Take 1 tablet (100 mg total) by mouth 3 (three) times daily. 90 tablet 3   hydrOXYzine (ATARAX/VISTARIL) 50 MG tablet Take 1 tablet (50 mg total) by mouth 3 (three) times daily as needed for itching. 60 tablet 3   Ipratropium-Albuterol (COMBIVENT) 20-100 MCG/ACT AERS respimat Inhale 1 puff into the lungs every 6 (six) hours as needed for wheezing or shortness of breath. 4 g 5   nystatin (MYCOSTATIN/NYSTOP) powder Apply 1 application topically 3 (three) times daily. 15 g 3   pantoprazole (PROTONIX) 40 MG tablet Take 1 tablet (40 mg total) by mouth daily. 90 tablet 3   sacubitril-valsartan (ENTRESTO) 97-103 MG Take 1 tablet by mouth 2 (two) times daily. 180 tablet 3   sildenafil (VIAGRA) 100 MG tablet Take 0.5 tablets (50 mg total) by mouth daily as needed for erectile dysfunction. 10 tablet 6   spironolactone (ALDACTONE) 25 MG tablet Take 2 tablets (50 mg total) by mouth daily. 180 tablet 3   thiamine 100 MG tablet Take 1 tablet (100 mg total) by mouth daily. 30 tablet 0   varenicline (CHANTIX CONTINUING MONTH PAK) 1 MG tablet Take 1 tablet (1 mg total) by mouth 2 (two) times daily. 60 tablet 3   warfarin (COUMADIN) 5 MG tablet Take 2.5 mg (1/2 tab) every Tuesday and 5 mg (1 tablet) all other days or as instructed by HF Clinic 100 tablet 3   Zinc Gluconate 100 MG TABS  Take 1 tablet (100 mg total) by mouth daily. 90 tablet 3   zolpidem (AMBIEN) 5 MG tablet Take 1 tablet (5 mg total) by mouth at bedtime. 30 tablet 3   cephALEXin (KEFLEX) 500 MG capsule Take 1 capsule (500 mg total) by mouth 3 (three) times daily. 90 capsule 11   No facility-administered medications prior to visit.     Allergies  Allergen Reactions   Chlorhexidine Rash    Blisters   Other Rash    Prep pads    Social History   Tobacco Use   Smoking status: Every Day    Packs/day: 0.25    Types: Cigarettes    Start date: 2003   Smokeless tobacco: Never   Tobacco comments:    slowed down  Vaping Use   Vaping Use: Never used  Substance Use Topics   Alcohol use: Not Currently  Drug use: Not Currently     Objective:   Vitals:   04/22/22 0911  Resp: 16  Temp: 97.8 F (36.6 C)  TempSrc: Oral  SpO2: 99%  Weight: 198 lb (89.8 kg)  Height: 5\' 11"  (1.803 m)     Body mass index is 27.62 kg/m.   Physical Exam Vitals reviewed.  Constitutional:      Appearance: Normal appearance. He is not ill-appearing.  Eyes:     General: No scleral icterus.    Pupils: Pupils are equal, round, and reactive to light.  Cardiovascular:     Rate and Rhythm: Normal rate.     Comments: LVAD Humm +  Pulmonary:     Effort: Pulmonary effort is normal.  Abdominal:     Palpations: Abdomen is soft.     Tenderness: There is no abdominal tenderness.     Comments: Driveline dressing clean and dry. Small pinhole opening at the top incision from previous dehiscence is >95% healed in. Looks much smaller. No drainage or local symptoms of infection. Bottom has completely healed in.   Skin:    General: Skin is warm and dry.     Capillary Refill: Capillary refill takes less than 2 seconds.  Neurological:     Mental Status: He is alert and oriented to person, place, and time.     Lab Results: Lab Results  Component Value Date   WBC 7.9 03/25/2022   HGB 13.3 03/25/2022   HCT 39.7 03/25/2022    MCV 85.7 03/25/2022   PLT 180 03/25/2022    Lab Results  Component Value Date   CREATININE 0.92 03/25/2022   BUN 11 03/25/2022   NA 140 03/25/2022   K 4.4 03/25/2022   CL 109 03/25/2022   CO2 21 (L) 03/25/2022    Sed Rate (mm/hr)  Date Value  05/28/2021 4  03/27/2021 1  01/16/2021 1   CRP (mg/dL)  Date Value  26/71/2458 0.6  03/27/2021 0.7  01/16/2021 0.7     Assessment & Plan:   Problem List Items Addressed This Visit       High   Infection associated with driveline of left ventricular assist device (LVAD) (HCC) - Primary    Doing very well on TID keflex for suppression. Exit site sounds great and the two previously dehisced spots on mid-abdominal incision have healed over. The top I think will chronically have scared dimple appearance but overall quite satisfied with the progress since last office visit.  He is tolerating antibiotics well and not experiencing any pill fatigue at this point. Could change to BID cefadroxil, but I also don't want to make any changes given he has been doing so well.  We both decided to continue what we have been doing since it works well.  RTC in 6-12 months depending on his preference.        Relevant Medications   cephALEXin (KEFLEX) 500 MG capsule     Unprioritized   Vaccine counseling    RSV vaccination would be useful for both Theodore Welch and his wife. They have higher risk for exposure with young grand children and medical / age risk for more severe disease due to infection. He will see if he needs any prescription and let me know but I am not sure the vaccine has been put out quite yet.        Rexene Alberts, MSN, NP-C [facility]  [email or web address] Pager: [phone] Office: [phone] [facility] Main Line: [phone]

## 2022-04-22 ENCOUNTER — Ambulatory Visit (INDEPENDENT_AMBULATORY_CARE_PROVIDER_SITE_OTHER): Payer: Medicare HMO | Admitting: Infectious Diseases

## 2022-04-22 ENCOUNTER — Encounter: Payer: Self-pay | Admitting: Infectious Diseases

## 2022-04-22 ENCOUNTER — Other Ambulatory Visit: Payer: Self-pay

## 2022-04-22 VITALS — Temp 97.8°F | Resp 16 | Ht 71.0 in | Wt 198.0 lb

## 2022-04-22 DIAGNOSIS — T827XXA Infection and inflammatory reaction due to other cardiac and vascular devices, implants and grafts, initial encounter: Secondary | ICD-10-CM | POA: Diagnosis not present

## 2022-04-22 DIAGNOSIS — Z7185 Encounter for immunization safety counseling: Secondary | ICD-10-CM | POA: Diagnosis not present

## 2022-04-22 MED ORDER — CEPHALEXIN 500 MG PO CAPS: 500.0000 mg | ORAL_CAPSULE | Freq: Three times a day (TID) | ORAL | 11 refills | Status: DC

## 2022-04-22 NOTE — Patient Instructions (Signed)
Pleasure to see you both today!  Everything sounds great. Please continue your keflex 3 times a day like you have been taking.   We can see you back in 6 months.   If you need a prescription for the RSV vaccine please let me know and we can send on to the pharmacy for you It is recommended for people 60 and older. I am not sure it is out yet but looks like it is coming soon.

## 2022-04-22 NOTE — Assessment & Plan Note (Signed)
RSV vaccination would be useful for both Remijio and his wife. They have higher risk for exposure with young grand children and medical / age risk for more severe disease due to infection. He will see if he needs any prescription and let me know but I am not sure the vaccine has been put out quite yet.

## 2022-04-22 NOTE — Assessment & Plan Note (Signed)
Doing very well on TID keflex for suppression. Exit site sounds great and the two previously dehisced spots on mid-abdominal incision have healed over. The top I think will chronically have scared dimple appearance but overall quite satisfied with the progress since last office visit.  He is tolerating antibiotics well and not experiencing any pill fatigue at this point. Could change to BID cefadroxil, but I also don't want to make any changes given he has been doing so well.  We both decided to continue what we have been doing since it works well.  RTC in 6-12 months depending on his preference.

## 2022-04-23 ENCOUNTER — Ambulatory Visit (HOSPITAL_COMMUNITY): Payer: Self-pay | Admitting: Pharmacist

## 2022-04-23 LAB — POCT INR: INR: 2 (ref 2.0–3.0)

## 2022-04-23 NOTE — Progress Notes (Signed)
LVAD INR 

## 2022-04-24 ENCOUNTER — Encounter (HOSPITAL_COMMUNITY)
Admission: RE | Admit: 2022-04-24 | Discharge: 2022-04-24 | Disposition: A | Discharge status: Home / Self Care | Payer: Medicare HMO | Source: Ambulatory Visit | Attending: Cardiology | Admitting: Cardiology

## 2022-04-24 DIAGNOSIS — Z95811 Presence of heart assist device: Secondary | ICD-10-CM | POA: Insufficient documentation

## 2022-04-24 DIAGNOSIS — K31811 Angiodysplasia of stomach and duodenum with bleeding: Secondary | ICD-10-CM | POA: Insufficient documentation

## 2022-04-24 MED ORDER — OCTREOTIDE ACETATE 20 MG IM KIT
20.0000 mg | PACK | INTRAMUSCULAR | Status: DC
  Administered 2022-04-24: 20 mg via INTRAMUSCULAR
  Filled 2022-04-24: qty 1

## 2022-04-29 ENCOUNTER — Ambulatory Visit (HOSPITAL_COMMUNITY): Payer: Self-pay | Admitting: Pharmacist

## 2022-04-29 LAB — POCT INR: INR: 2.3 (ref 2.0–3.0)

## 2022-04-29 NOTE — Progress Notes (Signed)
LVAD INR 

## 2022-04-30 ENCOUNTER — Other Ambulatory Visit (HOSPITAL_COMMUNITY): Payer: Self-pay | Admitting: Cardiology

## 2022-04-30 DIAGNOSIS — I159 Secondary hypertension, unspecified: Secondary | ICD-10-CM

## 2022-04-30 DIAGNOSIS — Z95811 Presence of heart assist device: Secondary | ICD-10-CM

## 2022-04-30 DIAGNOSIS — I5022 Chronic systolic (congestive) heart failure: Secondary | ICD-10-CM

## 2022-04-30 DIAGNOSIS — L299 Pruritus, unspecified: Secondary | ICD-10-CM

## 2022-05-07 ENCOUNTER — Ambulatory Visit (HOSPITAL_COMMUNITY): Payer: Self-pay | Admitting: Pharmacist

## 2022-05-07 LAB — POCT INR: INR: 2 (ref 2.0–3.0)

## 2022-05-07 NOTE — Progress Notes (Signed)
LVAD INR 

## 2022-05-13 ENCOUNTER — Ambulatory Visit (HOSPITAL_COMMUNITY): Payer: Self-pay | Admitting: Pharmacist

## 2022-05-13 LAB — POCT INR: INR: 2.2 (ref 2.0–3.0)

## 2022-05-20 ENCOUNTER — Ambulatory Visit (HOSPITAL_COMMUNITY): Payer: Self-pay

## 2022-05-20 LAB — POCT INR: INR: 2.1 (ref 2.0–3.0)

## 2022-05-20 NOTE — Patient Instructions (Signed)
Description   INR at goal. Instructed to continue current regimen of warfarin 2.5 mg every Tuesday and 5 mg all other days. LVM Has new home INR machine - will check every Monday

## 2022-05-20 NOTE — Progress Notes (Signed)
LVAD INR 

## 2022-05-22 ENCOUNTER — Encounter (HOSPITAL_COMMUNITY)
Admission: RE | Admit: 2022-05-22 | Discharge: 2022-05-22 | Disposition: A | Discharge status: Home / Self Care | Payer: Medicare HMO | Source: Ambulatory Visit | Attending: Cardiology | Admitting: Cardiology

## 2022-05-22 DIAGNOSIS — K31811 Angiodysplasia of stomach and duodenum with bleeding: Secondary | ICD-10-CM | POA: Diagnosis present

## 2022-05-22 DIAGNOSIS — Z95811 Presence of heart assist device: Secondary | ICD-10-CM | POA: Diagnosis not present

## 2022-05-22 MED ORDER — OCTREOTIDE ACETATE 20 MG IM KIT
20.0000 mg | PACK | INTRAMUSCULAR | Status: DC
  Administered 2022-05-22: 20 mg via INTRAMUSCULAR
  Filled 2022-05-22: qty 1

## 2022-05-23 ENCOUNTER — Other Ambulatory Visit (HOSPITAL_COMMUNITY): Payer: Self-pay | Admitting: Unknown Physician Specialty

## 2022-05-23 DIAGNOSIS — Z7901 Long term (current) use of anticoagulants: Secondary | ICD-10-CM

## 2022-05-23 DIAGNOSIS — Z95811 Presence of heart assist device: Secondary | ICD-10-CM

## 2022-05-27 ENCOUNTER — Ambulatory Visit (HOSPITAL_BASED_OUTPATIENT_CLINIC_OR_DEPARTMENT_OTHER)
Admission: RE | Admit: 2022-05-27 | Discharge: 2022-05-27 | Disposition: A | Discharge status: Home / Self Care | Payer: Medicare HMO | Source: Ambulatory Visit | Attending: Internal Medicine | Admitting: Internal Medicine

## 2022-05-27 ENCOUNTER — Ambulatory Visit (HOSPITAL_COMMUNITY)
Admission: RE | Admit: 2022-05-27 | Discharge: 2022-05-27 | Disposition: A | Discharge status: Home / Self Care | Payer: Medicare HMO | Source: Ambulatory Visit | Attending: Internal Medicine | Admitting: Internal Medicine

## 2022-05-27 ENCOUNTER — Encounter (HOSPITAL_COMMUNITY): Payer: Self-pay

## 2022-05-27 ENCOUNTER — Ambulatory Visit (HOSPITAL_COMMUNITY): Payer: Self-pay | Admitting: Pharmacist

## 2022-05-27 VITALS — BP 109/66 | HR 55 | Wt 204.0 lb

## 2022-05-27 DIAGNOSIS — F17200 Nicotine dependence, unspecified, uncomplicated: Secondary | ICD-10-CM | POA: Diagnosis not present

## 2022-05-27 DIAGNOSIS — I159 Secondary hypertension, unspecified: Secondary | ICD-10-CM | POA: Diagnosis not present

## 2022-05-27 DIAGNOSIS — E119 Type 2 diabetes mellitus without complications: Secondary | ICD-10-CM | POA: Insufficient documentation

## 2022-05-27 DIAGNOSIS — N529 Male erectile dysfunction, unspecified: Secondary | ICD-10-CM

## 2022-05-27 DIAGNOSIS — Z79899 Other long term (current) drug therapy: Secondary | ICD-10-CM | POA: Diagnosis not present

## 2022-05-27 DIAGNOSIS — E785 Hyperlipidemia, unspecified: Secondary | ICD-10-CM | POA: Diagnosis not present

## 2022-05-27 DIAGNOSIS — Z95811 Presence of heart assist device: Secondary | ICD-10-CM

## 2022-05-27 DIAGNOSIS — Z8614 Personal history of Methicillin resistant Staphylococcus aureus infection: Secondary | ICD-10-CM | POA: Insufficient documentation

## 2022-05-27 DIAGNOSIS — Z86718 Personal history of other venous thrombosis and embolism: Secondary | ICD-10-CM | POA: Diagnosis not present

## 2022-05-27 DIAGNOSIS — I5022 Chronic systolic (congestive) heart failure: Secondary | ICD-10-CM | POA: Insufficient documentation

## 2022-05-27 DIAGNOSIS — Z4801 Encounter for change or removal of surgical wound dressing: Secondary | ICD-10-CM | POA: Insufficient documentation

## 2022-05-27 DIAGNOSIS — Z7901 Long term (current) use of anticoagulants: Secondary | ICD-10-CM | POA: Diagnosis not present

## 2022-05-27 DIAGNOSIS — I420 Dilated cardiomyopathy: Secondary | ICD-10-CM | POA: Diagnosis not present

## 2022-05-27 DIAGNOSIS — G4733 Obstructive sleep apnea (adult) (pediatric): Secondary | ICD-10-CM | POA: Insufficient documentation

## 2022-05-27 DIAGNOSIS — L299 Pruritus, unspecified: Secondary | ICD-10-CM

## 2022-05-27 DIAGNOSIS — I10 Essential (primary) hypertension: Secondary | ICD-10-CM

## 2022-05-27 LAB — BASIC METABOLIC PANEL
Anion gap: 10 (ref 5–15)
BUN: 10 mg/dL (ref 8–23)
CO2: 23 mmol/L (ref 22–32)
Calcium: 8.9 mg/dL (ref 8.9–10.3)
Chloride: 107 mmol/L (ref 98–111)
Creatinine, Ser: 0.99 mg/dL (ref 0.61–1.24)
GFR, Estimated: >60 mL/min (ref >60)
Glucose, Bld: 154 mg/dL — ABNORMAL HIGH (ref 70–99)
Potassium: 3.9 mmol/L (ref 3.5–5.1)
Sodium: 140 mmol/L (ref 135–145)

## 2022-05-27 LAB — LACTATE DEHYDROGENASE: LDH: 201 U/L — ABNORMAL HIGH (ref 98–192)

## 2022-05-27 LAB — CBC
HCT: 40.5 % (ref 39.0–52.0)
Hemoglobin: 13.4 g/dL (ref 13.0–17.0)
MCH: 29.3 pg (ref 26.0–34.0)
MCHC: 33.1 g/dL (ref 30.0–36.0)
MCV: 88.6 fL (ref 80.0–100.0)
Platelets: 213 10*3/uL (ref 150–400)
RBC: 4.57 MIL/uL (ref 4.22–5.81)
RDW: 14.6 % (ref 11.5–15.5)
WBC: 8.6 10*3/uL (ref 4.0–10.5)
nRBC: 0 % (ref 0.0–0.2)

## 2022-05-27 LAB — PROTIME-INR
INR: 2 — ABNORMAL HIGH (ref 0.8–1.2)
Prothrombin Time: 22.8 seconds — ABNORMAL HIGH (ref 11.4–15.2)

## 2022-05-27 MED ORDER — SPIRONOLACTONE 25 MG PO TABS: 25.0000 mg | ORAL_TABLET | Freq: Every day | ORAL | 3 refills | Status: DC

## 2022-05-27 MED ORDER — HYDROXYZINE HCL 50 MG PO TABS: 50.0000 mg | ORAL_TABLET | Freq: Three times a day (TID) | ORAL | 3 refills | Status: DC | PRN

## 2022-05-27 MED ORDER — SACUBITRIL-VALSARTAN 97-103 MG PO TABS: 1.0000 | ORAL_TABLET | Freq: Two times a day (BID) | ORAL | 3 refills | Status: DC

## 2022-05-27 MED ORDER — SILDENAFIL CITRATE 100 MG PO TABS: 50.0000 mg | ORAL_TABLET | Freq: Every day | ORAL | 6 refills | Status: DC | PRN

## 2022-05-27 NOTE — Progress Notes (Signed)
LVAD INR 

## 2022-05-27 NOTE — Progress Notes (Signed)
  Echocardiogram 2D Echocardiogram has been performed.  Delcie Roch 05/27/2022, 9:41 AM

## 2022-05-27 NOTE — Progress Notes (Addendum)
Patient presents for 2 mo f/u in [facility] today with his wife [name]. Reports no problems with VAD equipment or concerns with drive line.  Pt reports he has been feeling great. Denies lightheadedness, dizziness, falls, heart failure symptoms, and signs of bleeding. He has been watching his diet, avoiding carbs/starches as much as he can. Weight has maintained since last visit. Only complaint today is his seasonal allergies. Advised may take plain Claritin or Zyrtec for allergy management.   Reports he is still smoking a few cigarettes a day. He takes Chantix when he remembers to take it. Discussed again need for total documented cessation in order to proceed with possible transplant evaluation.   Reports he is taking Spironolactone 25 mg daily instead of 50 mg daily as prescribed. 100+ PI events noted per day. Per Dr [name] will continue Arlyce Harman at decreased dose of 25 mg daily at this time. Updated prescription sent to pt's pharmacy.   [name] is changing his drive line dressing weekly using daily kit. Dressing change completed today as documented below. Remains on Keflex TID per ID. He had follow up with ID in June- no changes. Has follow up appt in December.   Receiving monthly Octreotide injections.   Refills sent for Vistaril, Sildenafil, and Entresto per pt request.   Echo completed prior to visit today. Reviewed by Dr [name].   Vital Signs: Temp:  Doppler Pressure: 108 Automatc BP: 109/66 (85) HR: 55 SB SPO2: 97% RA   Weight: 204 lb w/o eqt Last weight: 202.8 lb  VAD Indication: Destination Therapy due to smoking status   VAD interrogation & Equipment Management: Speed: 5800 Flow: 4.8 Power:  4.6 w    PI: 3.7 Hct: 41   Alarms: none Events: 100+ PI events daily Fixed speed: 5800 Low speed limit: 5500  Primary Controller:  Replace back up battery in 23 months. Back up controller:   Replace back up battery in 21 months. -- did not bring today   Annual Equipment  Maintenance on UBC/PM was performed on 07/30/21.  I reviewed the LVAD parameters from today and compared the results to the patient's prior recorded data. LVAD interrogation was NEGATIVE for significant power changes, NEGATIVE for clinical alarms and NEGATIVE for PI events/speed drops. No programming changes were made and pump is functioning within specified parameters. Pt is performing daily controller and system monitor self tests along with completing weekly and monthly maintenance for LVAD equipment.  LVAD equipment check completed and is in good working order. Back-up equipment present.   Abdominal incision care: Both upper and lower wounds closed, no drainage noted. Site cleansed daily per [place]. Both are open to air.   Exit Site Care: Drive line is being maintained weekly by [employer]. Gauze dressing with anchor intact and accurately applied. Existing VAD dressing removed and site care performed using sterile technique. Drive line exit site cleaned with betadine swabs x 2, allowed to dry, and gauze dressing with NO SILVER applied. Exit site healed and incorporated, the velour is fully implanted at exit site. Scant amount of dried brown drainage noted at exit site. No redness, tenderness, foul odor or rash noted. Drive line anchor intact. Dry skin noted around anchor site. Pt denies fever or chills. Continue weekly dressing changes. Pt provided with 14 daily kits, 15 anchors, adhesive remover, and betadine swabs for home use.   Device: N/A   BP & Labs:  Doppler 108- Doppler is reflecting modified systolic   Hgb 31.5 - No S/S of bleeding.  Specifically denies BRBPR.    LDH stable at 201 with established baseline of 150 - 220. Denies tea-colored urine. No power elevations noted on interrogation.   Patient Instructions: No medication changes Continue Spirolactone 25 mg daily Coumadin dosing per Ander Purpura PharmD May take Claritin (plain) or Zyrtec for allergies Return to clinic in 2 months. We  will complete your 4 year Intermacs, 6 minute walk, and annual maintenance at this visit. Please bring your black bag, battery charger, and MPU  Emerson Monte RN [place] Coordinator  Office: [phone]  24/7 Pager: [phone]    61 y.o. with history of nonischemic cardiomyopathy, RLE DVT, cirrhosis, smoking, and OSA returns for followup of CHF/LVAD placement.  Cardiomyopathy was diagnosed in 6/19 in [place], [place] at that time showed low output.  He was admitted to [facility] in 9/19 with low output HF and was started on milrinone and diuresed.  Unable to wean off milrinone.  He had a degree of RV failure, but this improved on milrinone.  Valvular heart disease also looked better with milrinone and diuresis.  On 08/03/18, he had Heartmate 3 LVAD placed.  Speed was optimized by ramp echo post-op.  Post-op course was relatively unremarkable.  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  No abscess on CT.  He was started on cefazolin IV for 6 wks.  BP-active meds decreased with low MAP.   He was admitted in 3/21 with subxiphoid abscess and cellulitis.  CT showed collection along the course of the driveline.  There was no evidence for sternal osteomyelitis.  Abscess was debrided, MSSA grew from wound cultures.  Wound vac was placed.  Patient was started on cefazolin, which was recently stopped after completing 8 wks.   Patient was re-admitted later in 3/21 with facial Zoster.  He was treated with acyclovir and this has resolved.   He was admitted in 6/21 with bleeding from the site of the subxiphoid abscess as well as cellulitis.  He required multiple units of PRBCs.  He went to the OR initially for I&D.  Blood and wound cultures grew MSSA.  He then went back to the OR for rectus flap over wound site (plastics).  He was started on cefazolin and rifabutin, has PICC.   He was admitted in 7/21 with GI bleeding, he had 6 units  PRBCs.  EGD showed duodenal AVMs, treated with APC.  INR goal with warfarin lowered to 1.8-2.3.   Echo in 5/22 was a difficult study with EF 25-30%, mild LVH, aortic valve difficult to visualize but appears to open with each beat, mildly dilated RV with moderately decreased systolic function.   He returns for LVAD followup.   Still smoking 2-3 cigarettes/day.  MAP 85 today.  Feels good overall.  Not getting much exercise.  No significant exertional dyspnea.  No lightheadedness.  He says he is drinking plenty of fluid but has a lot of PI events.     Denies LVAD alarms.  Reports taking Coumadin as prescribed and adherence to anticoagulation based dietary restrictions.  Denies bright red blood per rectum or melena, no dark urine or hematuria.    Labs (9/19): LDH 210, INR 2.17, WBCs 17.1 => 15, hgb 9.7, creatinine 0.72 Labs (10/19): K 4.3, creatinine 0.78 => 0.83, hgb 10.2 Labs (11/19): creatinine 0.79, hgb 10 Labs (2/20): K 3.9, creatinine 0.79 Labs (4/20): K 4, creatinine 1.15, hgb 11.4, LDH 199, INR 1.9 Labs (5/20):  hgb 10.4, WBCs 8.5 Labs (6/20): K 4.2, creatinine 1.06, hgb 11.2 Labs (8/20): k 3.7, creatinine 1.0, hgb 11.5, LDH 186 Labs (10/20): K 3.5, creatinine 0.93, INR 2, LDH 221 Labs (12/20): hgb 15.6, K 3.7, creatinine 1.02, LDH 250 Labs (4/21): K 4.1, creatinine 0.86, hgb 11.1 Labs (6/21): K 4.1, creatinine 0.76, hgb 10, WBCs 11.2 Labs (8/21): hgb 10.6 Labs (10/21): hgb 13.2, creatinine 1.17 Labs (7/22): hgb 8, LDH 372, INR 1.9, creatinine 0.95 Labs (8/22): hgb 12.6 Labs (9/22): K 3.9, creatinine 1.1 Labs (11/22): creatinine 1.01 Labs (5/23): K 4.4, creatinine 0.72, hgb 13.3, LDH 214  PMH: 1. Degenerative disc disease.  2. GERD 3. Chronic systolic CHF:  Nonischemic cardiomyopathy.  Dilated cardiomyopathy diagnosed 6/19 in [place].  LHC/RHC in 7/19 showed elevated filling pressures, low cardiac output, and no significant CAD.   - Echo (9/19): Severe LV dilation with EF 10-20%,  moderate-severe MR, severely dilated RV with mildly decreased systolic function, severe TR.  - Cardiac MRI (9/19): EF 14%, moderate dilated LV, severely dilated RV with mod-severe systolic function and EF 91%, nonspecific RV insertion site LGE.  - RHC (5/19) on milrinone 0.375: mean RA 8, PA 40/19, mean PCWP 12, PAPi 2.65, CI 2.71.  - Echo (9/19) on milrinone and diuresed): EF 20-25% with moderate LV dilation, moderately dilated RV with mildly decreased systolic function, mild-moderate MR, moderate TR, PASP 51 mmHg.  Cannot rule out noncompaction.  - Heartmate 3 LVAD placement in 9/19.  - Echo (5/22): EF 25-30%, mild LVH, aortic valve difficult to visualize but appears to open with each beat, mildly dilated RV with moderately decreased systolic function. 4. OSA: CPAP use.  5. Prior smoker 6. Type 2 diabetes 7. Hyperlipidemia.  8. H/o NSVT 9. 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  10. RLE DVT: found in 9/19.  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 Subxiphoid abscess involving collection along driveline in 6/38, MSSA.  6/21 Rectus flap to cover abscess site.  12. Facial Zoster 3/21 13. 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   Current Outpatient Medications  Medication Sig Dispense Refill   acetaminophen (TYLENOL) 500 MG tablet Take 1,000 mg by mouth every 6 (six) hours as needed for mild pain.     carbamide peroxide (DEBROX) 6.5 % OTIC solution Place 5 drops into both ears 3 (three) times daily. 15 mL 0   carvedilol (COREG) 6.25 MG tablet Take 1 tablet (6.25 mg total) by mouth 2 (two) times daily with a meal. 180 tablet 3   cephALEXin (KEFLEX) 500 MG capsule Take 1 capsule (500 mg total) by mouth 3 (three) times daily. 90 capsule 11   cyanocobalamin 500 MCG tablet Take 1,000 mcg by mouth daily.      fluticasone (FLONASE) 50 MCG/ACT nasal spray Place 1 spray into both nostrils 2 (two) times daily as needed for  allergies. 9.9 mL 5   gabapentin (NEURONTIN) 300 MG capsule Take 2 capsules (600 mg total) by mouth 4 (four) times daily. 240 capsule 8   hydrALAZINE (APRESOLINE) 100 MG tablet TAKE 1 TABLET BY MOUTH THREE TIMES DAILY 90 tablet 6   Ipratropium-Albuterol (COMBIVENT) 20-100 MCG/ACT AERS respimat Inhale 1 puff into the lungs every 6 (six) hours as needed for wheezing or shortness of breath. 4 g 5   nystatin (MYCOSTATIN/NYSTOP) powder Apply 1 application topically 3 (three) times daily. 15 g 3   pantoprazole (PROTONIX) 40 MG tablet Take 1 tablet (40 mg total) by  mouth daily. 90 tablet 3   thiamine 100 MG tablet Take 1 tablet (100 mg total) by mouth daily. 30 tablet 0   varenicline (CHANTIX CONTINUING MONTH PAK) 1 MG tablet Take 1 tablet (1 mg total) by mouth 2 (two) times daily. 60 tablet 3   warfarin (COUMADIN) 5 MG tablet Take 2.5 mg (1/2 tab) every Tuesday and 5 mg (1 tablet) all other days or as instructed by [facility] Clinic 100 tablet 3   Zinc Gluconate 100 MG TABS Take 1 tablet (100 mg total) by mouth daily. 90 tablet 3   zolpidem (AMBIEN) 5 MG tablet Take 1 tablet (5 mg total) by mouth at bedtime. 30 tablet 3   docusate sodium (COLACE) 100 MG capsule Take 2 capsules (200 mg total) by mouth daily as needed for mild constipation. (Patient not taking: Reported on 05/27/2022) 10 capsule 0   hydrOXYzine (ATARAX) 50 MG tablet Take 1 tablet (50 mg total) by mouth 3 (three) times daily as needed for itching. 60 tablet 3   sacubitril-valsartan (ENTRESTO) 97-103 MG Take 1 tablet by mouth 2 (two) times daily. 180 tablet 3   sildenafil (VIAGRA) 100 MG tablet Take 0.5 tablets (50 mg total) by mouth daily as needed for erectile dysfunction. 10 tablet 6   spironolactone (ALDACTONE) 25 MG tablet Take 1 tablet (25 mg total) by mouth daily. 90 tablet 3   No current facility-administered medications for this encounter.    Chlorhexidine and Other  REVIEW OF SYSTEMS: All systems negative except as listed in HPI, PMH  and Problem list.   LVAD INTERROGATION:  Please see LVAD nurse's note above for details.   I reviewed the LVAD parameters from today, and compared the results to the patient's prior recorded data.  No programming changes were made.  The LVAD is functioning within specified parameters.  The patient performs LVAD self-test daily.  LVAD interrogation was negative for any significant power changes, alarms or PI events/speed drops.  LVAD equipment check completed and is in good working order.  Back-up equipment present.   LVAD education done on emergency procedures and precautions and reviewed exit site care.   MAP 85  Physical Exam: General: Well appearing this am. NAD.  HEENT: Normal. Neck: Supple, JVP 7-8 cm. Carotids OK.  Cardiac:  Mechanical heart sounds with LVAD hum present.  Lungs:  CTAB, normal effort.  Abdomen:  NT, ND, no HSM. No bruits or masses. +BS  LVAD exit site: Well-healed and incorporated. Dressing dry and intact. No erythema or drainage. Stabilization device present and accurately applied. Driveline dressing changed daily per sterile technique. Extremities:  Warm and dry. No cyanosis, clubbing, rash, or edema.  Neuro:  Alert & oriented x 3. Cranial nerves grossly intact. Moves all 4 extremities w/o difficulty. Affect pleasant      ASSESSMENT AND PLAN:   1. 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  LVAD parameters stable.  MAP controlled.  NYHA class I-II.   He does not look volume overloaded. He is having a lot of PI events but feels fine, MAP is fine.   No overt GI bleeding. Echo was done today but was a technically very difficult study, cannot see LV very well at all.  - Push hydration, pending LDH/CBC/BMET.  - Continue spironolactone 25 mg daily.  - Continue Entresto 97/103 bid.   - Continue hydralazine 100 mg tid.  - Continue Coreg 6.25 mg bid.  - He is off ASA with GI bleeding.  -  Continue warfarin for INR 1.8-2.3.    -  Should be transplant candidate eventually if he can quit smoking.  Will make transplant clinic referral when he is off totally.  2. Smoking: He has cut back a lot but still smoking.  He tried Chantix but it did not work.  3. RLE DVT: On warfarin for LVAD.  4. OSA: Continue CPAP.  5. Hyperlipidemia: Atorvastatin.  6. Type II diabetes: Per PCP.  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  Site improved.  - He will have long-term Keflex for MSSA suppression.  Followup with ID.  8. 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 No overt bleeding.   - CBC today.  - Off ASA, INR goal now 1.8-2.3.  - Continue monthly octreotide injections.   Followup in 2 months    Loralie Champagne 05/27/2022

## 2022-05-27 NOTE — Patient Instructions (Signed)
No medication changes Continue Spirolactone 25 mg daily Coumadin dosing per [name] PharmD May take Claritin (plain) or Zyrtec for allergies Return to clinic in 2 months. We will complete your 4 year Intermacs, 6 minute walk, and annual maintenance at this visit. Please bring your black bag, battery charger, and MPU.

## 2022-06-04 ENCOUNTER — Ambulatory Visit (HOSPITAL_COMMUNITY): Payer: Self-pay | Admitting: Pharmacist

## 2022-06-04 LAB — POCT INR: INR: 2.1 (ref 2.0–3.0)

## 2022-06-10 ENCOUNTER — Other Ambulatory Visit (HOSPITAL_COMMUNITY): Payer: Self-pay | Admitting: Cardiology

## 2022-06-10 DIAGNOSIS — G4709 Other insomnia: Secondary | ICD-10-CM

## 2022-06-11 ENCOUNTER — Other Ambulatory Visit (HOSPITAL_COMMUNITY): Payer: Self-pay | Admitting: *Deleted

## 2022-06-11 ENCOUNTER — Ambulatory Visit (HOSPITAL_COMMUNITY): Payer: Self-pay | Admitting: Pharmacist

## 2022-06-11 LAB — POCT INR: INR: 2 (ref 2.0–3.0)

## 2022-06-11 NOTE — Telephone Encounter (Signed)
Called in refill request for Zolpidem 5 mg daily at bedtime. #30 tablets 5 refills per Dr Shirlee Latch to pt's phamarcy.   Alyce Pagan RN VAD Coordinator  Office: [phone]  24/7 Pager: [phone]

## 2022-06-18 ENCOUNTER — Ambulatory Visit (HOSPITAL_COMMUNITY): Payer: Self-pay | Admitting: Pharmacist

## 2022-06-18 LAB — POCT INR: INR: 1.9 — AB (ref 2.0–3.0)

## 2022-06-18 NOTE — Progress Notes (Signed)
LVAD INR 

## 2022-06-19 ENCOUNTER — Inpatient Hospital Stay (HOSPITAL_COMMUNITY): Admission: RE | Admit: 2022-06-19 | Payer: Medicare HMO | Source: Ambulatory Visit

## 2022-06-24 ENCOUNTER — Encounter (HOSPITAL_COMMUNITY)
Admission: RE | Admit: 2022-06-24 | Discharge: 2022-06-24 | Disposition: A | Discharge status: Home / Self Care | Payer: Medicare HMO | Source: Ambulatory Visit | Attending: Cardiology | Admitting: Cardiology

## 2022-06-24 ENCOUNTER — Ambulatory Visit (HOSPITAL_COMMUNITY): Payer: Self-pay | Admitting: Pharmacist

## 2022-06-24 DIAGNOSIS — Z95811 Presence of heart assist device: Secondary | ICD-10-CM | POA: Diagnosis present

## 2022-06-24 DIAGNOSIS — K31811 Angiodysplasia of stomach and duodenum with bleeding: Secondary | ICD-10-CM | POA: Diagnosis present

## 2022-06-24 LAB — POCT INR: INR: 2.3 (ref 2.0–3.0)

## 2022-06-24 MED ORDER — OCTREOTIDE ACETATE 20 MG IM KIT
20.0000 mg | PACK | INTRAMUSCULAR | Status: DC
  Administered 2022-06-24: 20 mg via INTRAMUSCULAR
  Filled 2022-06-24: qty 1

## 2022-06-28 ENCOUNTER — Other Ambulatory Visit (HOSPITAL_COMMUNITY): Payer: Self-pay | Admitting: Unknown Physician Specialty

## 2022-07-02 ENCOUNTER — Ambulatory Visit (HOSPITAL_COMMUNITY): Payer: Self-pay | Admitting: Pharmacist

## 2022-07-02 LAB — POCT INR: INR: 2.2 (ref 2.0–3.0)

## 2022-07-08 ENCOUNTER — Ambulatory Visit (HOSPITAL_COMMUNITY): Payer: Self-pay | Admitting: Pharmacist

## 2022-07-08 LAB — POCT INR: INR: 2.6 (ref 2.0–3.0)

## 2022-07-16 ENCOUNTER — Ambulatory Visit (HOSPITAL_COMMUNITY): Payer: Self-pay | Admitting: Pharmacist

## 2022-07-16 LAB — POCT INR: INR: 2.2 (ref 2.0–3.0)

## 2022-07-19 ENCOUNTER — Other Ambulatory Visit (HOSPITAL_COMMUNITY): Payer: Self-pay | Admitting: *Deleted

## 2022-07-23 ENCOUNTER — Encounter (HOSPITAL_COMMUNITY): Payer: Medicare HMO

## 2022-07-23 ENCOUNTER — Ambulatory Visit (HOSPITAL_COMMUNITY): Payer: Self-pay | Admitting: Pharmacist

## 2022-07-23 LAB — POCT INR: INR: 2.1 (ref 2.0–3.0)

## 2022-07-24 ENCOUNTER — Encounter (HOSPITAL_COMMUNITY)
Admission: RE | Admit: 2022-07-24 | Discharge: 2022-07-24 | Disposition: A | Discharge status: Home / Self Care | Payer: Medicare HMO | Source: Ambulatory Visit | Attending: Cardiology | Admitting: Cardiology

## 2022-07-24 DIAGNOSIS — K31811 Angiodysplasia of stomach and duodenum with bleeding: Secondary | ICD-10-CM | POA: Insufficient documentation

## 2022-07-24 DIAGNOSIS — Z95811 Presence of heart assist device: Secondary | ICD-10-CM | POA: Diagnosis present

## 2022-07-24 MED ORDER — OCTREOTIDE ACETATE 20 MG IM KIT
20.0000 mg | PACK | INTRAMUSCULAR | Status: DC
  Administered 2022-07-24: 20 mg via INTRAMUSCULAR
  Filled 2022-07-24: qty 1

## 2022-07-30 ENCOUNTER — Ambulatory Visit (HOSPITAL_COMMUNITY): Payer: Self-pay | Admitting: Pharmacist

## 2022-07-30 LAB — POCT INR: INR: 2.1 (ref 2.0–3.0)

## 2022-07-31 ENCOUNTER — Telehealth: Payer: Self-pay

## 2022-07-31 NOTE — Telephone Encounter (Signed)
Patient requesting RSV vaccine to be sent to [facility] on [address] in [place].   Marka Treloar Lesli Albee, CMA

## 2022-08-01 NOTE — Telephone Encounter (Signed)
I don't see how I can order that in the chart - do you think we can do a hand written order for it and fax over?

## 2022-08-02 ENCOUNTER — Other Ambulatory Visit (HOSPITAL_COMMUNITY): Payer: Self-pay | Admitting: *Deleted

## 2022-08-02 DIAGNOSIS — Z7901 Long term (current) use of anticoagulants: Secondary | ICD-10-CM

## 2022-08-02 DIAGNOSIS — Z95811 Presence of heart assist device: Secondary | ICD-10-CM

## 2022-08-05 ENCOUNTER — Ambulatory Visit (HOSPITAL_COMMUNITY)
Admission: RE | Admit: 2022-08-05 | Discharge: 2022-08-05 | Disposition: A | Discharge status: Home / Self Care | Payer: Medicare HMO | Source: Ambulatory Visit | Attending: Cardiology | Admitting: Cardiology

## 2022-08-05 ENCOUNTER — Encounter (HOSPITAL_COMMUNITY): Payer: Self-pay

## 2022-08-05 ENCOUNTER — Ambulatory Visit (HOSPITAL_COMMUNITY): Payer: Self-pay | Admitting: Pharmacist

## 2022-08-05 DIAGNOSIS — K7030 Alcoholic cirrhosis of liver without ascites: Secondary | ICD-10-CM | POA: Diagnosis not present

## 2022-08-05 DIAGNOSIS — Z7901 Long term (current) use of anticoagulants: Secondary | ICD-10-CM | POA: Diagnosis not present

## 2022-08-05 DIAGNOSIS — J449 Chronic obstructive pulmonary disease, unspecified: Secondary | ICD-10-CM | POA: Diagnosis not present

## 2022-08-05 DIAGNOSIS — G4733 Obstructive sleep apnea (adult) (pediatric): Secondary | ICD-10-CM | POA: Insufficient documentation

## 2022-08-05 DIAGNOSIS — I420 Dilated cardiomyopathy: Secondary | ICD-10-CM | POA: Insufficient documentation

## 2022-08-05 DIAGNOSIS — E119 Type 2 diabetes mellitus without complications: Secondary | ICD-10-CM | POA: Diagnosis not present

## 2022-08-05 DIAGNOSIS — Z86718 Personal history of other venous thrombosis and embolism: Secondary | ICD-10-CM | POA: Diagnosis not present

## 2022-08-05 DIAGNOSIS — I5022 Chronic systolic (congestive) heart failure: Secondary | ICD-10-CM | POA: Insufficient documentation

## 2022-08-05 DIAGNOSIS — B369 Superficial mycosis, unspecified: Secondary | ICD-10-CM | POA: Diagnosis not present

## 2022-08-05 DIAGNOSIS — F17200 Nicotine dependence, unspecified, uncomplicated: Secondary | ICD-10-CM | POA: Diagnosis not present

## 2022-08-05 DIAGNOSIS — E785 Hyperlipidemia, unspecified: Secondary | ICD-10-CM | POA: Diagnosis not present

## 2022-08-05 DIAGNOSIS — Z95811 Presence of heart assist device: Secondary | ICD-10-CM | POA: Diagnosis not present

## 2022-08-05 DIAGNOSIS — I10 Essential (primary) hypertension: Secondary | ICD-10-CM

## 2022-08-05 DIAGNOSIS — Z79899 Other long term (current) drug therapy: Secondary | ICD-10-CM | POA: Insufficient documentation

## 2022-08-05 DIAGNOSIS — I159 Secondary hypertension, unspecified: Secondary | ICD-10-CM

## 2022-08-05 LAB — COMPREHENSIVE METABOLIC PANEL
ALT: 13 U/L (ref 0–44)
AST: 16 U/L (ref 15–41)
Albumin: 3.7 g/dL (ref 3.5–5.0)
Alkaline Phosphatase: 90 U/L (ref 38–126)
Anion gap: 9 (ref 5–15)
BUN: 7 mg/dL — ABNORMAL LOW (ref 8–23)
CO2: 24 mmol/L (ref 22–32)
Calcium: 8.9 mg/dL (ref 8.9–10.3)
Chloride: 109 mmol/L (ref 98–111)
Creatinine, Ser: 0.96 mg/dL (ref 0.61–1.24)
GFR, Estimated: >60 mL/min (ref >60)
Glucose, Bld: 129 mg/dL — ABNORMAL HIGH (ref 70–99)
Potassium: 3.8 mmol/L (ref 3.5–5.1)
Sodium: 142 mmol/L (ref 135–145)
Total Bilirubin: 1 mg/dL (ref 0.3–1.2)
Total Protein: 6.2 g/dL — ABNORMAL LOW (ref 6.5–8.1)

## 2022-08-05 LAB — LACTATE DEHYDROGENASE: LDH: 215 U/L — ABNORMAL HIGH (ref 98–192)

## 2022-08-05 LAB — CBC
HCT: 39.1 % (ref 39.0–52.0)
Hemoglobin: 12.9 g/dL — ABNORMAL LOW (ref 13.0–17.0)
MCH: 29 pg (ref 26.0–34.0)
MCHC: 33 g/dL (ref 30.0–36.0)
MCV: 87.9 fL (ref 80.0–100.0)
Platelets: 200 10*3/uL (ref 150–400)
RBC: 4.45 MIL/uL (ref 4.22–5.81)
RDW: 13.5 % (ref 11.5–15.5)
WBC: 7.6 10*3/uL (ref 4.0–10.5)
nRBC: 0 % (ref 0.0–0.2)

## 2022-08-05 LAB — PROTIME-INR
INR: 2.1 — ABNORMAL HIGH (ref 0.8–1.2)
Prothrombin Time: 23.8 seconds — ABNORMAL HIGH (ref 11.4–15.2)

## 2022-08-05 LAB — PREALBUMIN: Prealbumin: 22 mg/dL (ref 18–38)

## 2022-08-05 MED ORDER — BUPROPION HCL ER (SR) 150 MG PO TB12: ORAL_TABLET | ORAL | 3 refills | Status: DC

## 2022-08-05 MED ORDER — SPIRONOLACTONE 25 MG PO TABS: 50.0000 mg | ORAL_TABLET | Freq: Every day | ORAL | 3 refills | Status: DC

## 2022-08-05 MED ORDER — FLUTICASONE PROPIONATE 50 MCG/ACT NA SUSP: 1.0000 | Freq: Two times a day (BID) | NASAL | 5 refills | Status: DC | PRN

## 2022-08-05 MED ORDER — SACUBITRIL-VALSARTAN 97-103 MG PO TABS: 1.0000 | ORAL_TABLET | Freq: Two times a day (BID) | ORAL | 3 refills | Status: DC

## 2022-08-05 MED ORDER — NYSTATIN 100000 UNIT/GM EX POWD: 1.0000 | Freq: Three times a day (TID) | CUTANEOUS | 3 refills | Status: DC

## 2022-08-05 MED ORDER — IPRATROPIUM-ALBUTEROL 20-100 MCG/ACT IN AERS: 1.0000 | INHALATION_SPRAY | Freq: Four times a day (QID) | RESPIRATORY_TRACT | 5 refills | Status: DC | PRN

## 2022-08-05 NOTE — Progress Notes (Addendum)
Patient presents for 2 mo f/u in [facility] today with his wife [name]. Reports no problems with VAD equipment or concerns with drive line.  Pt reports he has been feeling great. Denies lightheadedness, dizziness, falls, heart failure symptoms, and signs of bleeding. Weight slightly up since last visit. Pt endorses good appetite and is staying active.  Reports he is still smoking a few cigarettes a day. He reports he is not taking Chantix. Discussed again need for total documented cessation in order to proceed with possible transplant evaluation. Dr [name] to start Wellbutrin for smoking cessation.   [name] is changing his drive line dressing weekly using daily kit. Dressing change completed today as documented below. Remains on Keflex TID per ID. Has follow up appt in December.   Receiving monthly Octreotide injections.   Vital Signs: Temp:  Doppler Pressure: 143 Automatc BP: 143/99 HR: 61 SB SPO2: 98% RA   Weight: 209 lbs w/o equipment Last weight: 204 lbs w/o equipment  VAD Indication: Destination Therapy due to smoking status   VAD interrogation & Equipment Management: Speed: 5800 Flow: 3.7 Power: 4.5 w    PI: 7.2 Hct: 41   Alarms: none Events: 10-15 PI events daily Fixed speed: 5800 Low speed limit: 5500  Primary Controller:  Replace back up battery in 21 months. Back up controller:   Replace back up battery in 11 months.    Annual Equipment Maintenance on UBC/PM was performed on 08/05/21.  I reviewed the LVAD parameters from today and compared the results to the patient's prior recorded data. LVAD interrogation was NEGATIVE for significant power changes, NEGATIVE for clinical alarms and NEGATIVE for PI events/speed drops. No programming changes were made and pump is functioning within specified parameters. Pt is performing daily controller and system monitor self tests along with completing weekly and monthly maintenance for LVAD equipment.  LVAD equipment check  completed and is in good working order. Back-up equipment present.   Abdominal incision care: Both upper and lower wounds closed, no drainage noted. Site cleansed daily per [place]. Both are open to air.   Exit Site Care: Drive line is being maintained weekly by [employer]. Gauze dressing with anchor intact and accurately applied. Existing VAD dressing removed and site care performed using sterile technique. Drive line exit site cleaned with betadine swabs x 2, allowed to dry, and gauze dressing with NO SILVER applied. Exit site healed and incorporated, the velour is fully implanted at exit site. Scant amount of dried brown drainage noted at exit site. No redness, tenderness, foul odor or rash noted. Drive line anchor intact. Dry skin noted around anchor site. Pt denies fever or chills. Continue weekly dressing changes. Pt provided with 14 daily kits, 7 anchors, adhesive remover and betadine swabs for home use.   Device: N/A   BP & Labs:  Doppler 143- Doppler is reflecting modified systolic   Hgb 25.3 - No S/S of bleeding. Specifically denies BRBPR.    LDH stable at 215 with established baseline of 150 - 220. Denies tea-colored urine. No power elevations noted on interrogation.   Batteries Manufacture Date: Number of uses: Re-calibration  07/19/2021 64 Performed by patient  07/19/2021 84    Annual maintenance completed per [place] on patient's home power module and Electrical engineer.    Backup system controller 11 volt battery charged during visit.   4 year Intermacs follow up completed including:  Quality of Life, KCCQ-12, and Neurocognitive trail making.   Pt completed 1360 feet during 6 minute  walk.  [facility] Cardiomyopathy Questionnaire     08/05/2022    1:40 PM 07/30/2021    2:32 PM 01/16/2021   11:47 AM  KCCQ-12  1 a. Ability to shower/bathe Moderately limited Not at all limited Not at all limited  1 b. Ability to walk 1 block Slightly limited Not at all limited Slightly  limited  1 c. Ability to hurry/jog Extremely limited Quite a bit limited Extremely limited  2. Edema feet/ankles/legs Never over the past 2 weeks Never over the past 2 weeks Never over the past 2 weeks  3. Limited by fatigue Less than once a week 1-2 times a week At least once a day  4. Limited by dyspnea Less than once a week Less than once a week At least once a day  5. Sitting up / on 3+ pillows Never over the past 2 weeks Never over the past 2 weeks Never over the past 2 weeks  6. Limited enjoyment of life Moderately limited Slightly limited Moderately limited  7. Rest of life w/ symptoms Mostly satisfied Mostly satisfied Somewhat satisfied  8 a. Participation in hobbies Limited quite a bit Moderately limited Severely limited  8 b. Participation in chores Slightly limited Limited quite a bit Moderately limited  8 c. Visiting family/friends Slightly limited Slightly limited Slightly limited     Patient Goals: Pt goal is to quit smoking. Dicussed with Dr [name]. Will add Wellbutrin to aid in smoking cessation.   Patient Instructions: Return to clinic in 2 months  Start Wellbutrin for smoking cessation 157m once daily for 3 days then increase to twice a day Increase Spironolactone to 50 mg daily Call LVAD Coordinator with any driveline or equipment issues  OPrudence DavidsonRN,BSN VAD Coordinator  Office: [phone] 24/7 Pager: [phone]  61y.o. with history of nonischemic cardiomyopathy, RLE DVT, cirrhosis, smoking, and OSA returns for followup of CHF/LVAD placement.  [place]  [facility] and was started on milrinone and diuresed.  Unable to wean off milrinone.  He had a degree of RV failure, but this improved on milrinone.  Valvular heart disease also looked better with milrinone and diuresis.  On 08/03/18, he had Heartmate 3 LVAD placed.  Speed was optimized by ramp  echo post-op.  Post-op course was relatively unremarkable.  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  No abscess on CT.  He was started on cefazolin IV for 6 wks.  BP-active meds decreased with low MAP.   He was admitted in 3/21 with subxiphoid abscess and cellulitis.  CT showed collection along the course of the driveline.  There was no evidence for sternal osteomyelitis.  Abscess was debrided, MSSA grew from wound cultures.  Wound vac was placed.  Patient was started on cefazolin, which was recently stopped after completing 8 wks.   Patient was re-admitted later in 3/21 with facial Zoster.  He was treated with acyclovir and this has resolved.   He was admitted in 6/21 with bleeding from the site of the subxiphoid abscess as well as cellulitis.  He required multiple units of PRBCs.  He went to the OR initially for I&D.  Blood and wound cultures grew MSSA.  He then went back to the OR for rectus flap over wound site (plastics).  He  was started on cefazolin and rifabutin, has PICC.   He was admitted in 7/21 with GI bleeding, he had 6 units PRBCs.  EGD showed duodenal AVMs, treated with APC.  INR goal with warfarin lowered to 1.8-2.3.   Echo in 5/22 was a difficult study with EF 25-30%, mild LVH, aortic valve difficult to visualize but appears to open with each beat, mildly dilated RV with moderately decreased systolic function.   He returns for LVAD followup.   Still smoking 2-3 cigarettes/day.  MAP elevated today at 108.  No complaints.  No dyspnea walking on flat ground.  No orthopnea/PND.  Driveline site looks good.  Weight is up 5 lbs.     Denies LVAD alarms.  Reports taking Coumadin as prescribed and adherence to anticoagulation based dietary restrictions.  Denies bright red blood per rectum or melena, no dark urine or hematuria.    Labs (9/19): LDH 210, INR 2.17, WBCs 17.1 => 15, hgb 9.7, creatinine  0.72 Labs (10/19): K 4.3, creatinine 0.78 => 0.83, hgb 10.2 Labs (11/19): creatinine 0.79, hgb 10 Labs (2/20): K 3.9, creatinine 0.79 Labs (4/20): K 4, creatinine 1.15, hgb 11.4, LDH 199, INR 1.9 Labs (5/20): hgb 10.4, WBCs 8.5 Labs (6/20): K 4.2, creatinine 1.06, hgb 11.2 Labs (8/20): k 3.7, creatinine 1.0, hgb 11.5, LDH 186 Labs (10/20): K 3.5, creatinine 0.93, INR 2, LDH 221 Labs (12/20): hgb 15.6, K 3.7, creatinine 1.02, LDH 250 Labs (4/21): K 4.1, creatinine 0.86, hgb 11.1 Labs (6/21): K 4.1, creatinine 0.76, hgb 10, WBCs 11.2 Labs (8/21): hgb 10.6 Labs (10/21): hgb 13.2, creatinine 1.17 Labs (7/22): hgb 8, LDH 372, INR 1.9, creatinine 0.95 Labs (8/22): hgb 12.6 Labs (9/22): K 3.9, creatinine 1.1 Labs (11/22): creatinine 1.01 Labs (5/23): K 4.4, creatinine 0.72, hgb 13.3, LDH 214  PMH: 1. Degenerative disc disease.  2. GERD 3. Chronic systolic CHF:  Nonischemic cardiomyopathy.  Dilated cardiomyopathy diagnosed 6/19 in [place].  LHC/RHC in 7/19 showed elevated filling pressures, low cardiac output, and no significant CAD.   - Echo (9/19): Severe LV dilation with EF 10-20%, moderate-severe MR, severely dilated RV with mildly decreased systolic function, severe TR.  - Cardiac MRI (9/19): EF 14%, moderate dilated LV, severely dilated RV with mod-severe systolic function and EF 68%, nonspecific RV insertion site LGE.  - RHC (5/19) on milrinone 0.375: mean RA 8, PA 40/19, mean PCWP 12, PAPi 2.65, CI 2.71.  - Echo (9/19) on milrinone and diuresed): EF 20-25% with moderate LV dilation, moderately dilated RV with mildly decreased systolic function, mild-moderate MR, moderate TR, PASP 51 mmHg.  Cannot rule out noncompaction.  - Heartmate 3 LVAD placement in 9/19.  - Echo (5/22): EF 25-30%, mild LVH, aortic valve difficult to visualize but appears to open with each beat, mildly dilated RV with moderately decreased systolic function. 4. OSA: CPAP use.  5. Prior smoker 6. Type 2 diabetes 7.  Hyperlipidemia.  8. H/o NSVT 9. 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  10. RLE DVT: found in 9/19.  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 Subxiphoid abscess involving collection along driveline in 3/41, MSSA.  6/21 Rectus flap to cover abscess site.  12. Facial Zoster 3/21 13. 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   Current Outpatient Medications  Medication Sig Dispense Refill   acetaminophen (TYLENOL) 500 MG tablet Take 1,000 mg by mouth every 6 (six) hours as needed for mild pain.     buPROPion [facility] SR)  150 MG 12 hr tablet Start by taking 1 tablet daily for three days; then increase to twice a daily.  164m once daily for 3 days; 1536mtwice daily 90 tablet 3   carbamide peroxide (DEBROX) 6.5 % OTIC solution Place 5 drops into both ears 3 (three) times daily. 15 mL 0   carvedilol (COREG) 6.25 MG tablet Take 1 tablet (6.25 mg total) by mouth 2 (two) times daily with a meal. 180 tablet 3   cephALEXin (KEFLEX) 500 MG capsule Take 1 capsule (500 mg total) by mouth 3 (three) times daily. 90 capsule 11   cyanocobalamin 500 MCG tablet Take 1,000 mcg by mouth daily.      docusate sodium (COLACE) 100 MG capsule Take 2 capsules (200 mg total) by mouth daily as needed for mild constipation. 10 capsule 0   gabapentin (NEURONTIN) 300 MG capsule Take 2 capsules (600 mg total) by mouth 4 (four) times daily. 240 capsule 8   hydrALAZINE (APRESOLINE) 100 MG tablet TAKE 1 TABLET BY MOUTH THREE TIMES DAILY 90 tablet 6   hydrOXYzine (ATARAX) 50 MG tablet Take 1 tablet (50 mg total) by mouth 3 (three) times daily as needed for itching. 60 tablet 3   pantoprazole (PROTONIX) 40 MG tablet Take 1 tablet (40 mg total) by mouth daily. 90 tablet 3   sildenafil (VIAGRA) 100 MG tablet Take 0.5 tablets (50 mg total) by mouth daily as needed for erectile dysfunction. 10 tablet 6   thiamine 100 MG tablet Take 1 tablet (100 mg total)  by mouth daily. 30 tablet 0   warfarin (COUMADIN) 5 MG tablet Take 2.5 mg (1/2 tab) every Tuesday and 5 mg (1 tablet) all other days or as instructed by HF Clinic 100 tablet 3   Zinc Gluconate 100 MG TABS Take 1 tablet (100 mg total) by mouth daily. 90 tablet 3   zolpidem (AMBIEN) 5 MG tablet TAKE 1 TABLET BY MOUTH ONCE DAILY AT BEDTIME 30 tablet 5   fluticasone (FLONASE) 50 MCG/ACT nasal spray Place 1 spray into both nostrils 2 (two) times daily as needed for allergies. 9.9 mL 5   Ipratropium-Albuterol (COMBIVENT) 20-100 MCG/ACT AERS respimat Inhale 1 puff into the lungs every 6 (six) hours as needed for wheezing or shortness of breath. 4 g 5   nystatin (MYCOSTATIN/NYSTOP) powder Apply 1 Application topically 3 (three) times daily. 30 g 3   sacubitril-valsartan (ENTRESTO) 97-103 MG Take 1 tablet by mouth 2 (two) times daily. 180 tablet 3   spironolactone (ALDACTONE) 25 MG tablet Take 2 tablets (50 mg total) by mouth daily. 90 tablet 3   varenicline (CHANTIX CONTINUING MONTH PAK) 1 MG tablet Take 1 tablet (1 mg total) by mouth 2 (two) times daily. (Patient not taking: Reported on 08/05/2022) 60 tablet 3   No current facility-administered medications for this encounter.    Chlorhexidine and Other  REVIEW OF SYSTEMS: All systems negative except as listed in HPI, PMH and Problem list.   LVAD INTERROGATION:  Please see LVAD nurse's note above for details.   I reviewed the LVAD parameters from today, and compared the results to the patient's prior recorded data.  No programming changes were made.  The LVAD is functioning within specified parameters.  The patient performs LVAD self-test daily.  LVAD interrogation was negative for any significant power changes, alarms or PI events/speed drops.  LVAD equipment check completed and is in good working order.  Back-up equipment present.   LVAD education done on emergency procedures and  precautions and reviewed exit site care.   MAP 108  Physical  Exam: General: Well appearing this am. NAD.  HEENT: Normal. Neck: Supple, JVP 7-8 cm. Carotids OK.  Cardiac:  Mechanical heart sounds with LVAD hum present.  Lungs:  CTAB, normal effort.  Abdomen:  NT, ND, no HSM. No bruits or masses. +BS  LVAD exit site: Well-healed and incorporated. Dressing dry and intact. No erythema or drainage. Stabilization device present and accurately applied. Driveline dressing changed daily per sterile technique. Extremities:  Warm and dry. No cyanosis, clubbing, rash, or edema.  Neuro:  Alert & oriented x 3. Cranial nerves grossly intact. Moves all 4 extremities w/o difficulty. Affect pleasant      ASSESSMENT AND PLAN:   1. 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  LVAD parameters stable.  NYHA class I-II.   He does not look volume overloaded. No overt GI bleeding.  MAP is elevated.  - Pending LDH/CBC/BMET.  - Increase spironolactone to 50 mg daily with elevated MAP.  - Continue Entresto 97/103 bid.   - Continue hydralazine 100 mg tid.  - Continue Coreg 6.25 mg bid.  - He is off ASA with GI bleeding.  - Continue warfarin for INR 1.8-2.3.    - Should be transplant candidate eventually if he can quit smoking.  Will make transplant clinic referral when he is off totally.  2. Smoking: He has cut back a lot but still smoking.  He tried Chantix but it did not work.  - He is willing to try wellbutrin for smoking cessation, will give prescription.  3. RLE DVT: On warfarin for LVAD.  4. OSA: Continue CPAP.  5. Hyperlipidemia: Atorvastatin.  6. Type II diabetes: Per PCP.  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  Site improved.  - He will have long-term Keflex for MSSA suppression.  Followup with ID.  8. 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 No overt bleeding.   - CBC today.  - Off ASA, INR goal now 1.8-2.3.  - Continue monthly  octreotide injections.   Followup in 2 months    Loralie Champagne 08/05/2022

## 2022-08-05 NOTE — Patient Instructions (Addendum)
Return to clinic in 2 months  Start Wellbutrin for smoking cessation 150mg  once daily for 3 days then increase to twice a day Increase Spironolactone to 50 mg daily Call LVAD Coordinator with any driveline or equipment issues

## 2022-08-07 ENCOUNTER — Other Ambulatory Visit (HOSPITAL_COMMUNITY): Payer: Self-pay

## 2022-08-07 DIAGNOSIS — Z95811 Presence of heart assist device: Secondary | ICD-10-CM

## 2022-08-07 DIAGNOSIS — B369 Superficial mycosis, unspecified: Secondary | ICD-10-CM

## 2022-08-07 MED ORDER — NYSTATIN 100000 UNIT/GM EX CREA: 1.0000 | TOPICAL_CREAM | Freq: Three times a day (TID) | CUTANEOUS | 0 refills | Status: DC

## 2022-08-07 NOTE — Progress Notes (Signed)
Patient called due to inability to pick up Nystatin powder from pharmacy. VAD Coordinator called [facility] and was notified that it was due to a formulary change and to call [facility] for details. VAD Coordinator reached out to [facility] and representative and was told that only the cream was covered.Pt made aware. Rx sent to pharmacy.  Bobbye Morton RN, BSN VAD Coordinator 24/7 Pager [phone]

## 2022-08-13 ENCOUNTER — Other Ambulatory Visit (HOSPITAL_COMMUNITY): Payer: Self-pay | Admitting: Cardiology

## 2022-08-13 ENCOUNTER — Ambulatory Visit (HOSPITAL_COMMUNITY): Payer: Self-pay | Admitting: Pharmacist

## 2022-08-13 LAB — POCT INR: INR: 2.1 (ref 2.0–3.0)

## 2022-08-19 ENCOUNTER — Ambulatory Visit (HOSPITAL_COMMUNITY): Payer: Self-pay | Admitting: Pharmacist

## 2022-08-19 LAB — POCT INR: INR: 2.2 (ref 2.0–3.0)

## 2022-08-21 ENCOUNTER — Ambulatory Visit (HOSPITAL_COMMUNITY)
Admission: RE | Admit: 2022-08-21 | Discharge: 2022-08-21 | Disposition: A | Discharge status: Home / Self Care | Payer: Medicare HMO | Source: Ambulatory Visit | Attending: Cardiology | Admitting: Cardiology

## 2022-08-21 DIAGNOSIS — K31811 Angiodysplasia of stomach and duodenum with bleeding: Secondary | ICD-10-CM | POA: Diagnosis present

## 2022-08-21 DIAGNOSIS — Z95811 Presence of heart assist device: Secondary | ICD-10-CM | POA: Insufficient documentation

## 2022-08-21 MED ORDER — OCTREOTIDE ACETATE 20 MG IM KIT
20.0000 mg | PACK | INTRAMUSCULAR | Status: DC
  Administered 2022-08-21: 20 mg via INTRAMUSCULAR
  Filled 2022-08-21: qty 1

## 2022-08-26 ENCOUNTER — Ambulatory Visit (HOSPITAL_COMMUNITY): Payer: Self-pay | Admitting: Pharmacist

## 2022-08-26 LAB — POCT INR: INR: 3 (ref 2.0–3.0)

## 2022-09-03 ENCOUNTER — Ambulatory Visit (HOSPITAL_COMMUNITY): Payer: Self-pay | Admitting: Pharmacist

## 2022-09-03 LAB — POCT INR: INR: 2 (ref 2.0–3.0)

## 2022-09-10 ENCOUNTER — Ambulatory Visit (HOSPITAL_COMMUNITY): Payer: Self-pay | Admitting: Pharmacist

## 2022-09-10 LAB — POCT INR: INR: 2.2 (ref 2.0–3.0)

## 2022-09-11 ENCOUNTER — Other Ambulatory Visit (HOSPITAL_COMMUNITY): Payer: Self-pay

## 2022-09-16 ENCOUNTER — Ambulatory Visit (HOSPITAL_COMMUNITY): Payer: Self-pay | Admitting: Pharmacist

## 2022-09-16 LAB — POCT INR: INR: 2.1 (ref 2.0–3.0)

## 2022-09-18 ENCOUNTER — Other Ambulatory Visit (HOSPITAL_COMMUNITY): Payer: Self-pay | Admitting: *Deleted

## 2022-09-18 ENCOUNTER — Ambulatory Visit (HOSPITAL_COMMUNITY)
Admission: RE | Admit: 2022-09-18 | Discharge: 2022-09-18 | Disposition: A | Discharge status: Home / Self Care | Payer: Medicare HMO | Source: Ambulatory Visit | Attending: Cardiology | Admitting: Cardiology

## 2022-09-18 DIAGNOSIS — K31811 Angiodysplasia of stomach and duodenum with bleeding: Secondary | ICD-10-CM | POA: Diagnosis present

## 2022-09-18 DIAGNOSIS — Z95811 Presence of heart assist device: Secondary | ICD-10-CM

## 2022-09-18 MED ORDER — OCTREOTIDE ACETATE 20 MG IM KIT
20.0000 mg | PACK | INTRAMUSCULAR | Status: DC
  Administered 2022-09-18: 20 mg via INTRAMUSCULAR
  Filled 2022-09-18: qty 1

## 2022-09-23 ENCOUNTER — Ambulatory Visit (HOSPITAL_COMMUNITY): Payer: Self-pay | Admitting: Pharmacist

## 2022-09-23 LAB — POCT INR: INR: 1.9 — AB (ref 2.0–3.0)

## 2022-10-01 ENCOUNTER — Ambulatory Visit (HOSPITAL_COMMUNITY): Payer: Self-pay | Admitting: Pharmacist

## 2022-10-01 LAB — POCT INR: INR: 2.1 (ref 2.0–3.0)

## 2022-10-04 ENCOUNTER — Other Ambulatory Visit (HOSPITAL_COMMUNITY): Payer: Self-pay | Admitting: Unknown Physician Specialty

## 2022-10-04 DIAGNOSIS — Z7901 Long term (current) use of anticoagulants: Secondary | ICD-10-CM

## 2022-10-04 DIAGNOSIS — Z95811 Presence of heart assist device: Secondary | ICD-10-CM

## 2022-10-07 ENCOUNTER — Encounter (HOSPITAL_COMMUNITY): Payer: Self-pay

## 2022-10-07 ENCOUNTER — Ambulatory Visit (HOSPITAL_COMMUNITY)
Admission: RE | Admit: 2022-10-07 | Discharge: 2022-10-07 | Disposition: A | Discharge status: Home / Self Care | Payer: Medicare HMO | Source: Ambulatory Visit | Attending: Internal Medicine | Admitting: Internal Medicine

## 2022-10-07 ENCOUNTER — Ambulatory Visit (HOSPITAL_COMMUNITY): Payer: Self-pay | Admitting: Pharmacist

## 2022-10-07 VITALS — BP 130/0 | HR 57

## 2022-10-07 DIAGNOSIS — F17210 Nicotine dependence, cigarettes, uncomplicated: Secondary | ICD-10-CM | POA: Insufficient documentation

## 2022-10-07 DIAGNOSIS — I5022 Chronic systolic (congestive) heart failure: Secondary | ICD-10-CM | POA: Diagnosis not present

## 2022-10-07 DIAGNOSIS — Z95811 Presence of heart assist device: Secondary | ICD-10-CM | POA: Diagnosis present

## 2022-10-07 DIAGNOSIS — I428 Other cardiomyopathies: Secondary | ICD-10-CM | POA: Diagnosis not present

## 2022-10-07 DIAGNOSIS — E785 Hyperlipidemia, unspecified: Secondary | ICD-10-CM | POA: Insufficient documentation

## 2022-10-07 DIAGNOSIS — Z7901 Long term (current) use of anticoagulants: Secondary | ICD-10-CM | POA: Diagnosis not present

## 2022-10-07 DIAGNOSIS — I110 Hypertensive heart disease with heart failure: Secondary | ICD-10-CM | POA: Diagnosis not present

## 2022-10-07 DIAGNOSIS — K7460 Unspecified cirrhosis of liver: Secondary | ICD-10-CM | POA: Diagnosis not present

## 2022-10-07 DIAGNOSIS — Z86718 Personal history of other venous thrombosis and embolism: Secondary | ICD-10-CM | POA: Diagnosis not present

## 2022-10-07 DIAGNOSIS — Z79899 Other long term (current) drug therapy: Secondary | ICD-10-CM | POA: Diagnosis not present

## 2022-10-07 DIAGNOSIS — E119 Type 2 diabetes mellitus without complications: Secondary | ICD-10-CM | POA: Diagnosis not present

## 2022-10-07 DIAGNOSIS — G4733 Obstructive sleep apnea (adult) (pediatric): Secondary | ICD-10-CM | POA: Insufficient documentation

## 2022-10-07 LAB — BASIC METABOLIC PANEL
Anion gap: 7 (ref 5–15)
BUN: 10 mg/dL (ref 8–23)
CO2: 23 mmol/L (ref 22–32)
Calcium: 8.8 mg/dL — ABNORMAL LOW (ref 8.9–10.3)
Chloride: 110 mmol/L (ref 98–111)
Creatinine, Ser: 1.02 mg/dL (ref 0.61–1.24)
GFR, Estimated: >60 mL/min (ref >60)
Glucose, Bld: 144 mg/dL — ABNORMAL HIGH (ref 70–99)
Potassium: 3.9 mmol/L (ref 3.5–5.1)
Sodium: 140 mmol/L (ref 135–145)

## 2022-10-07 LAB — CBC
HCT: 39.5 % (ref 39.0–52.0)
Hemoglobin: 12.8 g/dL — ABNORMAL LOW (ref 13.0–17.0)
MCH: 28.8 pg (ref 26.0–34.0)
MCHC: 32.4 g/dL (ref 30.0–36.0)
MCV: 88.8 fL (ref 80.0–100.0)
Platelets: 192 10*3/uL (ref 150–400)
RBC: 4.45 MIL/uL (ref 4.22–5.81)
RDW: 14.6 % (ref 11.5–15.5)
WBC: 8.2 10*3/uL (ref 4.0–10.5)
nRBC: 0 % (ref 0.0–0.2)

## 2022-10-07 LAB — LACTATE DEHYDROGENASE: LDH: 182 U/L (ref 98–192)

## 2022-10-07 LAB — PROTIME-INR
INR: 1.6 — ABNORMAL HIGH (ref 0.8–1.2)
Prothrombin Time: 18.9 seconds — ABNORMAL HIGH (ref 11.4–15.2)

## 2022-10-07 NOTE — Patient Instructions (Signed)
Return to clinic in 2 months  Call LVAD Coordinator with any driveline or equipment issues Call medical records for your medical records request at [phone]

## 2022-10-07 NOTE — Progress Notes (Addendum)
Patient presents for 2 mo f/u in [facility] today with his wife [name]. Reports no problems with VAD equipment or concerns with drive line.  Pt reports he has been feeling great. Denies lightheadedness, dizziness, falls, heart failure symptoms, and signs of bleeding. Pt endorses good appetite and is staying active.  Reports he is still smoking a few cigarettes a day. He reports he is not taking Wellbutrin as prescribed last visit.Marland Kitchen Discussed again need for total documented cessation in order to proceed with possible transplant evaluation.   [name] is changing his drive line dressing weekly using daily kit. Dressing change completed today as documented below. Remains on Keflex TID per ID. Has follow up appt in December.   Receiving monthly Octreotide injections.   Vital Signs: Doppler Pressure: 130 Automatc BP: 119/69 (90) HR: 57 SB SPO2: 97% RA   Weight: 204 lbs w/o equipment Last weight: 209 lbs w/o equipment  VAD Indication: Destination Therapy due to smoking status   VAD interrogation & Equipment Management: Speed: 5800 Flow: 4.3 Power: 4.5 w    PI: 5.7 Hct: 41   Alarms: none Events: 10-15 PI events daily Fixed speed: 5800 Low speed limit: 5500  Primary Controller:  Replace back up battery in 19 months. Back up controller:   Replace back up battery in 9 months.    Annual Equipment Maintenance on UBC/PM was performed on 08/05/21.  I reviewed the LVAD parameters from today and compared the results to the patient's prior recorded data. LVAD interrogation was NEGATIVE for significant power changes, NEGATIVE for clinical alarms and NEGATIVE for PI events/speed drops. No programming changes were made and pump is functioning within specified parameters. Pt is performing daily controller and system monitor self tests along with completing weekly and monthly maintenance for LVAD equipment.  LVAD equipment check completed and is in good working order. Back-up equipment present.    Abdominal incision care: Closed. No longer requires any wound care.   Exit Site Care: Drive line is being maintained weekly by [employer]. Gauze dressing with anchor intact and accurately applied. Existing VAD dressing removed and site care performed using sterile technique. Drive line exit site cleaned with betadine swabs x 2, allowed to dry, and gauze dressing with NO SILVER applied. Exit site healed and incorporated, the velour is fully implanted at exit site. No drainage, redness, tenderness, foul odor or rash noted. Drive line anchor intact. Dry skin noted around anchor site. Pt denies fever or chills. Continue weekly dressing changes. Pt provided with 14 daily kits, 15 anchors, adhesive remover and betadine swabs for home use.      Device: N/A   BP & Labs:  Doppler 130- Doppler is reflecting modified systolic   Hgb 15.0 - No S/S of bleeding. Specifically denies BRBPR.    LDH stable at 182 with established baseline of 150 - 220. Denies tea-colored urine. No power elevations noted on interrogation.    Patient Instructions: Return to clinic in 2 months  Call LVAD Coordinator with any driveline or equipment issues Call medical records for your medical records request at [place] RN,BSN VAD Coordinator  Office: [phone]  24/7 Pager: [phone]   61 y.o. with history of nonischemic cardiomyopathy, RLE DVT, cirrhosis, smoking, and OSA returns for followup of CHF/LVAD placement.  Cardiomyopathy was diagnosed in 6/19 in [place], [place] at that time showed low output.  He was admitted to [facility] in 9/19 with low output HF and was started on milrinone and diuresed.  Unable to wean  off milrinone.  He had a degree of RV failure, but this improved on milrinone.  Valvular heart disease also looked better with milrinone and diuresis.  On 08/03/18, he had Heartmate 3 LVAD placed.  Speed was optimized by ramp echo post-op.  Post-op course was relatively unremarkable.  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  No abscess on CT.  He was started on cefazolin IV for 6 wks.  BP-active meds decreased with low MAP.   He was admitted in 3/21 with subxiphoid abscess and cellulitis.  CT showed collection along the course of the driveline.  There was no evidence for sternal osteomyelitis.  Abscess was debrided, MSSA grew from wound cultures.  Wound vac was placed.  Patient was started on cefazolin, which was recently stopped after completing 8 wks.   Patient was re-admitted later in 3/21 with facial Zoster.  He was treated with acyclovir and this has resolved.   He was admitted in 6/21 with bleeding from the site of the subxiphoid abscess as well as cellulitis.  He required multiple units of PRBCs.  He went to the OR initially for I&D.  Blood and wound cultures grew MSSA.  He then went back to the OR for rectus flap over wound site (plastics).  He was started on cefazolin and rifabutin, has PICC.   He was admitted in 7/21 with GI bleeding, he had 6 units PRBCs.  EGD showed duodenal AVMs, treated with APC.  INR goal with warfarin lowered to 1.8-2.3.   Echo in 5/22 was a difficult study with EF 25-30%, mild LVH, aortic valve difficult to visualize but appears to open with each beat, mildly dilated RV with moderately decreased systolic function.  Echo in 7/23 was a very difficult study .  He returns for LVAD followup.   Still smoking 2-3 cigarettes/day, has failed Wellbutrin and Chantix.  BP is controlled, MAP 90 today.  Driveline looks good.  He has no significant exertional dyspnea, reports no limitations.  Weight is down 6 lbs.    Denies LVAD alarms.  Reports taking Coumadin as prescribed and adherence to anticoagulation based dietary restrictions.  Denies bright red blood per rectum or melena, no dark urine or hematuria.    Labs (9/19): LDH 210, INR 2.17, WBCs 17.1 => 15, hgb 9.7, creatinine  0.72 Labs (10/19): K 4.3, creatinine 0.78 => 0.83, hgb 10.2 Labs (11/19): creatinine 0.79, hgb 10 Labs (2/20): K 3.9, creatinine 0.79 Labs (4/20): K 4, creatinine 1.15, hgb 11.4, LDH 199, INR 1.9 Labs (5/20): hgb 10.4, WBCs 8.5 Labs (6/20): K 4.2, creatinine 1.06, hgb 11.2 Labs (8/20): k 3.7, creatinine 1.0, hgb 11.5, LDH 186 Labs (10/20): K 3.5, creatinine 0.93, INR 2, LDH 221 Labs (12/20): hgb 15.6, K 3.7, creatinine 1.02, LDH 250 Labs (4/21): K 4.1, creatinine 0.86, hgb 11.1 Labs (6/21): K 4.1, creatinine 0.76, hgb 10, WBCs 11.2 Labs (8/21): hgb 10.6 Labs (10/21): hgb 13.2, creatinine 1.17 Labs (7/22): hgb 8, LDH 372, INR 1.9, creatinine 0.95 Labs (8/22): hgb 12.6 Labs (9/22): K 3.9, creatinine 1.1 Labs (11/22): creatinine 1.01 Labs (5/23): K 4.4, creatinine 0.72, hgb 13.3, LDH 214 Labs (7/23): K 3.8, creatinine 0.96  PMH: 1. Degenerative disc disease.  2. GERD 3. Chronic systolic CHF:  Nonischemic cardiomyopathy.  Dilated cardiomyopathy diagnosed 6/19 in [place].  LHC/RHC in 7/19 showed elevated filling pressures, low cardiac output, and no significant CAD.   - Echo (  9/19): Severe LV dilation with EF 10-20%, moderate-severe MR, severely dilated RV with mildly decreased systolic function, severe TR.  - Cardiac MRI (9/19): EF 14%, moderate dilated LV, severely dilated RV with mod-severe systolic function and EF 96%, nonspecific RV insertion site LGE.  - RHC (5/19) on milrinone 0.375: mean RA 8, PA 40/19, mean PCWP 12, PAPi 2.65, CI 2.71.  - Echo (9/19) on milrinone and diuresed): EF 20-25% with moderate LV dilation, moderately dilated RV with mildly decreased systolic function, mild-moderate MR, moderate TR, PASP 51 mmHg.  Cannot rule out noncompaction.  - Heartmate 3 LVAD placement in 9/19.  - Echo (5/22): EF 25-30%, mild LVH, aortic valve difficult to visualize but appears to open with each beat, mildly dilated RV with moderately decreased systolic function. 4. OSA: CPAP use.  5.  Prior smoker 6. Type 2 diabetes 7. Hyperlipidemia.  8. H/o NSVT 9. 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  10. RLE DVT: found in 9/19.  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 Subxiphoid abscess involving collection along driveline in 2/95, MSSA.  6/21 Rectus flap to cover abscess site.  12. Facial Zoster 3/21 13. 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   Current Outpatient Medications  Medication Sig Dispense Refill   acetaminophen (TYLENOL) 500 MG tablet Take 1,000 mg by mouth every 6 (six) hours as needed for mild pain.     carbamide peroxide (DEBROX) 6.5 % OTIC solution Place 5 drops into both ears 3 (three) times daily. 15 mL 0   carvedilol (COREG) 6.25 MG tablet Take 1 tablet (6.25 mg total) by mouth 2 (two) times daily with a meal. 180 tablet 3   cephALEXin (KEFLEX) 500 MG capsule Take 1 capsule (500 mg total) by mouth 3 (three) times daily. 90 capsule 11   cyanocobalamin 500 MCG tablet Take 1,000 mcg by mouth daily.      docusate sodium (COLACE) 100 MG capsule Take 2 capsules (200 mg total) by mouth daily as needed for mild constipation. 10 capsule 0   fluticasone (FLONASE) 50 MCG/ACT nasal spray Place 1 spray into both nostrils 2 (two) times daily as needed for allergies. 9.9 mL 5   gabapentin (NEURONTIN) 300 MG capsule Take 2 capsules (600 mg total) by mouth 4 (four) times daily. 240 capsule 8   hydrALAZINE (APRESOLINE) 100 MG tablet TAKE 1 TABLET BY MOUTH THREE TIMES DAILY 90 tablet 6   hydrOXYzine (ATARAX) 50 MG tablet Take 1 tablet (50 mg total) by mouth 3 (three) times daily as needed for itching. 60 tablet 3   Ipratropium-Albuterol (COMBIVENT) 20-100 MCG/ACT AERS respimat Inhale 1 puff into the lungs every 6 (six) hours as needed for wheezing or shortness of breath. 4 g 5   nystatin cream (MYCOSTATIN) Apply 1 Application topically 3 (three) times daily. 30 g 0   pantoprazole (PROTONIX) 40 MG tablet  Take 1 tablet (40 mg total) by mouth daily. 90 tablet 3   sacubitril-valsartan (ENTRESTO) 97-103 MG Take 1 tablet by mouth 2 (two) times daily. 180 tablet 3   sildenafil (VIAGRA) 100 MG tablet Take 0.5 tablets (50 mg total) by mouth daily as needed for erectile dysfunction. 10 tablet 6   spironolactone (ALDACTONE) 25 MG tablet Take 2 tablets (50 mg total) by mouth daily. 90 tablet 3   thiamine 100 MG tablet Take 1 tablet (100 mg total) by mouth daily. 30 tablet 0   warfarin (COUMADIN) 5 MG tablet TAKE 1/2 (ONE-HALF) TABLET  BY MOUTH ONCE DAILY EVERY TUESDAY AND 1 ONCE DAILY ON ALL OTHER DAYS OR AS INSTRUCTED BY HF CLINIC 30 tablet 6   Zinc Gluconate 100 MG TABS Take 1 tablet (100 mg total) by mouth daily. 90 tablet 3   zolpidem (AMBIEN) 5 MG tablet TAKE 1 TABLET BY MOUTH ONCE DAILY AT BEDTIME 30 tablet 5   buPROPion (WELLBUTRIN SR) 150 MG 12 hr tablet Start by taking 1 tablet daily for three days; then increase to twice a daily.  130m once daily for 3 days; 1581mtwice daily (Patient not taking: Reported on 10/07/2022) 90 tablet 3   varenicline (CHANTIX CONTINUING MONTH PAK) 1 MG tablet Take 1 tablet (1 mg total) by mouth 2 (two) times daily. (Patient not taking: Reported on 08/05/2022) 60 tablet 3   No current facility-administered medications for this encounter.    Chlorhexidine and Other  REVIEW OF SYSTEMS: All systems negative except as listed in HPI, PMH and Problem list.   LVAD INTERROGATION:  Please see LVAD nurse's note above for details.   I reviewed the LVAD parameters from today, and compared the results to the patient's prior recorded data.  No programming changes were made.  The LVAD is functioning within specified parameters.  The patient performs LVAD self-test daily.  LVAD interrogation was negative for any significant power changes, alarms or PI events/speed drops.  LVAD equipment check completed and is in good working order.  Back-up equipment present.   LVAD education done on  emergency procedures and precautions and reviewed exit site care.   MAP 90  Physical Exam: General: Well appearing this am. NAD.  HEENT: Normal. Neck: Supple, JVP 7-8 cm. Carotids OK.  Cardiac:  Mechanical heart sounds with LVAD hum present.  Lungs:  CTAB, normal effort.  Abdomen:  NT, ND, no HSM. No bruits or masses. +BS  LVAD exit site: Well-healed and incorporated. Dressing dry and intact. No erythema or drainage. Stabilization device present and accurately applied. Driveline dressing changed daily per sterile technique. Extremities:  Warm and dry. No cyanosis, clubbing, rash, or edema.  Neuro:  Alert & oriented x 3. Cranial nerves grossly intact. Moves all 4 extremities w/o difficulty. Affect pleasant      ASSESSMENT AND PLAN:   1. 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  LVAD parameters stable.  NYHA class I-II.   He is not volume overloaded on exam.  MAP is controlled.  - LDH/CBC/BMET sent and pending.  - Continue spironolactone 50 mg daily.   - Continue Entresto 97/103 bid.   - Continue hydralazine 100 mg tid.  - Continue Coreg 6.25 mg bid.  - He is off ASA with GI bleeding.  - Continue warfarin for INR 1.8-2.3.    - Should be transplant candidate eventually if he can quit smoking.  Will make transplant clinic referral when he is off totally.  2. Smoking: He has cut back a lot but still smoking.  He tried Chantix and Wellbutrin but they did not work.   3. RLE DVT: On warfarin for LVAD.  4. OSA: Continue CPAP.  5. Hyperlipidemia: Atorvastatin.  6. Type II diabetes: Per PCP.  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  Site improved.  - He will have long-term Keflex for MSSA suppression.  Followup with ID.  8. 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 No overt bleeding.   - CBC today.  -  Off ASA, INR goal now 1.8-2.3.  - Continue monthly  octreotide injections.   Followup in 2 months    Loralie Champagne 10/07/2022

## 2022-10-14 ENCOUNTER — Ambulatory Visit (HOSPITAL_COMMUNITY): Payer: Self-pay | Admitting: Pharmacist

## 2022-10-14 LAB — POCT INR: INR: 2.1 (ref 2.0–3.0)

## 2022-10-16 ENCOUNTER — Ambulatory Visit (HOSPITAL_COMMUNITY)
Admission: RE | Admit: 2022-10-16 | Discharge: 2022-10-16 | Disposition: A | Discharge status: Home / Self Care | Payer: Medicare HMO | Source: Ambulatory Visit | Attending: Cardiology | Admitting: Cardiology

## 2022-10-16 DIAGNOSIS — Z95811 Presence of heart assist device: Secondary | ICD-10-CM | POA: Diagnosis present

## 2022-10-16 DIAGNOSIS — K31811 Angiodysplasia of stomach and duodenum with bleeding: Secondary | ICD-10-CM | POA: Insufficient documentation

## 2022-10-16 MED ORDER — OCTREOTIDE ACETATE 20 MG IM KIT
20.0000 mg | PACK | INTRAMUSCULAR | Status: DC
  Administered 2022-10-16: 20 mg via INTRAMUSCULAR
  Filled 2022-10-16: qty 1

## 2022-10-17 ENCOUNTER — Other Ambulatory Visit (HOSPITAL_COMMUNITY): Payer: Self-pay | Admitting: Cardiology

## 2022-10-17 DIAGNOSIS — I10 Essential (primary) hypertension: Secondary | ICD-10-CM

## 2022-10-17 DIAGNOSIS — I5022 Chronic systolic (congestive) heart failure: Secondary | ICD-10-CM

## 2022-10-17 DIAGNOSIS — Z95811 Presence of heart assist device: Secondary | ICD-10-CM

## 2022-10-22 ENCOUNTER — Ambulatory Visit (HOSPITAL_COMMUNITY): Payer: Self-pay | Admitting: Pharmacist

## 2022-10-22 LAB — POCT INR: INR: 2.2 (ref 2.0–3.0)

## 2022-10-24 ENCOUNTER — Other Ambulatory Visit (HOSPITAL_COMMUNITY): Payer: Self-pay | Admitting: *Deleted

## 2022-10-24 DIAGNOSIS — I5022 Chronic systolic (congestive) heart failure: Secondary | ICD-10-CM

## 2022-10-24 DIAGNOSIS — Z95811 Presence of heart assist device: Secondary | ICD-10-CM

## 2022-10-24 DIAGNOSIS — I10 Essential (primary) hypertension: Secondary | ICD-10-CM

## 2022-10-24 MED ORDER — SPIRONOLACTONE 25 MG PO TABS: 50.0000 mg | ORAL_TABLET | Freq: Every day | ORAL | 3 refills | Status: DC

## 2022-10-24 MED ORDER — CARVEDILOL 6.25 MG PO TABS: 6.2500 mg | ORAL_TABLET | Freq: Two times a day (BID) | ORAL | 3 refills | Status: DC

## 2022-10-29 ENCOUNTER — Other Ambulatory Visit: Payer: Self-pay

## 2022-10-29 ENCOUNTER — Ambulatory Visit (INDEPENDENT_AMBULATORY_CARE_PROVIDER_SITE_OTHER): Payer: Medicare HMO | Admitting: Infectious Diseases

## 2022-10-29 ENCOUNTER — Ambulatory Visit (HOSPITAL_COMMUNITY): Payer: Self-pay | Admitting: Pharmacist

## 2022-10-29 ENCOUNTER — Encounter: Payer: Self-pay | Admitting: Infectious Diseases

## 2022-10-29 VITALS — HR 66 | Resp 16 | Ht 71.0 in | Wt 202.0 lb

## 2022-10-29 DIAGNOSIS — Z7185 Encounter for immunization safety counseling: Secondary | ICD-10-CM | POA: Diagnosis not present

## 2022-10-29 DIAGNOSIS — T827XXA Infection and inflammatory reaction due to other cardiac and vascular devices, implants and grafts, initial encounter: Secondary | ICD-10-CM | POA: Diagnosis not present

## 2022-10-29 LAB — POCT INR: INR: 2 (ref 2.0–3.0)

## 2022-10-29 NOTE — Assessment & Plan Note (Signed)
Theodore Welch continues to have good control and suppression over MSSA infected LVAD pump. All dressings are clean and dry. He mentions some occasional pain over the sternum where the flap was constructed, however this is not frequent, not severe and seems to ease off on it's own with rest / massage. I think this is most likely scar tissue. We did go over symptoms that would be concerning to report to his LVAD team including swelling, erythema, warmth, open drainage of course.  He will continue chronic cephalexin 3 times a day, probably OK to drop to BID but since he is doing so well, would prefer to continue current dose in an effort to keep this as contained as possible going forward. He has no side effects from his antibiotic.   I reviewed current CBC and CMP - nothing concerning as it pertains to chronic antibiotic use.   He would like to follow up again in 63m. Has access to medicines until then.

## 2022-10-29 NOTE — Progress Notes (Signed)
Patient: Theodore Welch  DOB: 1961/03/12 MRN: [id number] PCP: [name], MD    Subjective:  CC: FU chronic LVAD MSSA infection (bacteremic several times, sternal abscess, DL exit site infections); s/p sternal rectus flap.  No concerns today - his wife, [name] joins him.     ID Hx: Theodore Welch is a 61 y.o. 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   January-2020: MSSA infection of counter incision to mid abdomen; s/p serial debridements. Initially did not involve exit site at this time. Received IV Cefazolin x 4 weeks --> PO Cephalexin x 4 weeks.  May-2020: recurrent MSSA infection, now involving driveline exit site and secondary bacteremia. TEE deferred due to low sensitivity for detecting VAD endocarditis. 6 weeks IV Cefazolin s/p abd wall debridement --> cephalexin for chronic suppression for duration pump is in place.   March-2021 sudden onset subxiphoid abscess involving proximal driveline, MSSA on cultures. Blood cultures negative. S/P I&D of abdominal wall exit site and subxiphoid space. IV cefazolin x 8 weeks (May 10th 2021).  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   Apr 16, 2020 - readmitted following re-opening and bleeding of subxiphoid wound. +MSSA and taken for debridement 6/01. Required repair of outflow graft at this surgery as well (?sharp puncture vs degenerated graft d/t infection). Vertical Rectus Flap repair performed with serial debridements. BC (-). PICC Cefazolin + Rifabutin planned through 7/7  April 27, 2020 - bleeding from sternum r/t subxiphoid abscess --> debridement in OR, Cx again grew out MSSA. Back on IV cefazolin for treatment.   March 2022 - enterobacter cloacae growing from non-healing soft tissue wound. Added 2 weeks cipro to treat --> Resolved. Has  continued on cephalexin.      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 Had all covid and flu boosters and RSV shot. Had a new grandbaby this past Friday. Sometimes gets he gets only 2 doses of cephalexin in but 90% of the time gets the 3 doses in.   FU with LVAD clinic 10/07/22 with photo of healed surgical wounds below. He has zero drainage, zero pain. No concern for symptoms of infection. He is on weekly gauze dressing changes and continues the chronic cephalexin.      Review of Systems  Constitutional:  Negative for chills and fever.  HENT:  Negative for tinnitus.   Eyes:  Negative for blurred vision and photophobia.  Respiratory:  Negative for cough and sputum production.   Cardiovascular:  Negative for chest pain.  Gastrointestinal:  Negative for diarrhea, nausea and vomiting.  Genitourinary:  Negative for dysuria.  Skin:  Negative for rash.  Neurological:  Negative for headaches.      Past Medical History:  Diagnosis Date   Abscess 01/2020   sternal abscess   Cardiomyopathy, unspecified (HCC)    CHF (congestive heart failure) (HCC)    Chronic back pain    Diabetes mellitus without complication (HCC)    Enlarged heart    Gastric ulcer    Gastroenteritis    H/O degenerative disc disease    Hypertension    LVAD (left ventricular assist device) present [facility])     Outpatient Medications Prior to Visit  Medication Sig Dispense Refill   acetaminophen (TYLENOL) 500 MG tablet Take 1,000 mg by  mouth every 6 (six) hours as needed for mild pain.     buPROPion (WELLBUTRIN SR) 150 MG 12 hr tablet Start by taking 1 tablet daily for three days; then increase to twice a daily.  150mg  once daily for 3 days; 150mg  twice daily 90 tablet 3   carbamide peroxide (DEBROX) 6.5 % OTIC solution Place 5 drops into both ears 3 (three) times daily. 15 mL 0   carvedilol (COREG) 6.25 MG tablet Take 1 tablet (6.25 mg total) by mouth 2 (two)  times daily with a meal. 180 tablet 3   cephALEXin (KEFLEX) 500 MG capsule Take 1 capsule (500 mg total) by mouth 3 (three) times daily. 90 capsule 11   cyanocobalamin 500 MCG tablet Take 1,000 mcg by mouth daily.      docusate sodium (COLACE) 100 MG capsule Take 2 capsules (200 mg total) by mouth daily as needed for mild constipation. 10 capsule 0   fluticasone (FLONASE) 50 MCG/ACT nasal spray Place 1 spray into both nostrils 2 (two) times daily as needed for allergies. 9.9 mL 5   gabapentin (NEURONTIN) 300 MG capsule Take 2 capsules (600 mg total) by mouth 4 (four) times daily. 240 capsule 8   hydrALAZINE (APRESOLINE) 100 MG tablet TAKE 1 TABLET BY MOUTH THREE TIMES DAILY 90 tablet 6   hydrOXYzine (ATARAX) 50 MG tablet Take 1 tablet (50 mg total) by mouth 3 (three) times daily as needed for itching. 60 tablet 3   Ipratropium-Albuterol (COMBIVENT) 20-100 MCG/ACT AERS respimat Inhale 1 puff into the lungs every 6 (six) hours as needed for wheezing or shortness of breath. 4 g 5   nystatin cream (MYCOSTATIN) Apply 1 Application topically 3 (three) times daily. 30 g 0   pantoprazole (PROTONIX) 40 MG tablet Take 1 tablet (40 mg total) by mouth daily. 90 tablet 3   sacubitril-valsartan (ENTRESTO) 97-103 MG Take 1 tablet by mouth 2 (two) times daily. 180 tablet 3   sildenafil (VIAGRA) 100 MG tablet Take 0.5 tablets (50 mg total) by mouth daily as needed for erectile dysfunction. 10 tablet 6   spironolactone (ALDACTONE) 25 MG tablet Take 2 tablets (50 mg total) by mouth daily. 180 tablet 3   thiamine 100 MG tablet Take 1 tablet (100 mg total) by mouth daily. 30 tablet 0   varenicline (CHANTIX CONTINUING MONTH PAK) 1 MG tablet Take 1 tablet (1 mg total) by mouth 2 (two) times daily. 60 tablet 3   warfarin (COUMADIN) 5 MG tablet TAKE 1/2 (ONE-HALF) TABLET BY MOUTH ONCE DAILY EVERY TUESDAY AND 1 ONCE DAILY ON ALL OTHER DAYS OR AS INSTRUCTED BY HF CLINIC 30 tablet 6   Zinc Gluconate 100 MG TABS Take 1 tablet  (100 mg total) by mouth daily. 90 tablet 3   zolpidem (AMBIEN) 5 MG tablet TAKE 1 TABLET BY MOUTH ONCE DAILY AT BEDTIME 30 tablet 5   No facility-administered medications prior to visit.     Allergies  Allergen Reactions   Chlorhexidine Rash    Blisters   Other Rash    Prep pads    Social History   Tobacco Use   Smoking status: Every Day    Packs/day: 0.25    Types: Cigarettes    Start date: 2003   Smokeless tobacco: Never   Tobacco comments:    slowed down  Vaping Use   Vaping Use: Never used  Substance Use Topics   Alcohol use: Not Currently   Drug use: Not Currently     Objective:  Vitals:   10/29/22 0857  Pulse: 66  Resp: 16  SpO2: 98%  Weight: 202 lb (91.6 kg)  Height: 5\' 11"  (1.803 m)     Body mass index is 28.17 kg/m.   Physical Exam Vitals reviewed.  Constitutional:      Appearance: Normal appearance. He is not ill-appearing.  Eyes:     General: No scleral icterus.    Pupils: Pupils are equal, round, and reactive to light.  Cardiovascular:     Rate and Rhythm: Normal rate.     Comments: LVAD Humm +  Pulmonary:     Effort: Pulmonary effort is normal.  Abdominal:     Palpations: Abdomen is soft.     Tenderness: There is no abdominal tenderness.     Comments: Driveline dressing clean and dry. Other abdominal incisions completely healed over and open to air.   Skin:    General: Skin is warm and dry.     Capillary Refill: Capillary refill takes less than 2 seconds.  Neurological:     Mental Status: He is alert and oriented to person, place, and time.      Lab Results: Lab Results  Component Value Date   WBC 8.2 10/07/2022   HGB 12.8 (L) 10/07/2022   HCT 39.5 10/07/2022   MCV 88.8 10/07/2022   PLT 192 10/07/2022    Lab Results  Component Value Date   CREATININE 1.02 10/07/2022   BUN 10 10/07/2022   NA 140 10/07/2022   K 3.9 10/07/2022   CL 110 10/07/2022   CO2 23 10/07/2022    Sed Rate (mm/hr)  Date Value  05/28/2021 4   03/27/2021 1  01/16/2021 1   CRP (mg/dL)  Date Value  05/28/2021 0.6  03/27/2021 0.7  01/16/2021 0.7     Assessment & Plan:   Problem List Items Addressed This Visit       High   Infection associated with driveline of left ventricular assist device (LVAD) ([place]) - Primary    Rena continues to have good control and suppression over MSSA infected LVAD pump. All dressings are clean and dry. He mentions some occasional pain over the sternum where the flap was constructed, however this is not frequent, not severe and seems to ease off on it's own with rest / massage. I think this is most likely scar tissue. We did go over symptoms that would be concerning to report to his LVAD team including swelling, erythema, warmth, open drainage of course.  He will continue chronic cephalexin 3 times a day, probably OK to drop to BID but since he is doing so well, would prefer to continue current dose in an effort to keep this as contained as possible going forward. He has no side effects from his antibiotic.   I reviewed current CBC and CMP - nothing concerning as it pertains to chronic antibiotic use.   He would like to follow up again in 70m. Has access to medicines until then.         Unprioritized   Vaccine counseling    Received RSV vaccine, covid booster and annual flu shot. Shingrix completed as well. No further recommendations.       Janene Madeira, MSN, NP-C [facility] [email or web address] Pager: [phone] Office: [place]: [phone]

## 2022-10-29 NOTE — Patient Instructions (Signed)
Nice to see you both!  Please continue the antibiotic 3 times a day.  Enjoy the holidays and all those great grand babies!  See you back in June of 2024 so we can refill your medicine.

## 2022-10-29 NOTE — Assessment & Plan Note (Signed)
Received RSV vaccine, covid booster and annual flu shot. Shingrix completed as well. No further recommendations.

## 2022-11-05 ENCOUNTER — Ambulatory Visit (HOSPITAL_COMMUNITY): Payer: Self-pay | Admitting: Pharmacist

## 2022-11-05 LAB — POCT INR: INR: 1.9 — AB (ref 2.0–3.0)

## 2022-11-07 ENCOUNTER — Other Ambulatory Visit (HOSPITAL_COMMUNITY): Payer: Self-pay

## 2022-11-12 ENCOUNTER — Ambulatory Visit (HOSPITAL_COMMUNITY): Payer: Self-pay | Admitting: Pharmacist

## 2022-11-12 LAB — POCT INR: INR: 2 (ref 2.0–3.0)

## 2022-11-13 ENCOUNTER — Encounter (HOSPITAL_COMMUNITY): Payer: Medicare HMO

## 2022-11-15 ENCOUNTER — Encounter (HOSPITAL_COMMUNITY)
Admission: RE | Admit: 2022-11-15 | Discharge: 2022-11-15 | Disposition: A | Discharge status: Home / Self Care | Payer: Medicare HMO | Source: Ambulatory Visit | Attending: Cardiology | Admitting: Cardiology

## 2022-11-15 DIAGNOSIS — K31811 Angiodysplasia of stomach and duodenum with bleeding: Secondary | ICD-10-CM | POA: Diagnosis present

## 2022-11-15 DIAGNOSIS — Z95811 Presence of heart assist device: Secondary | ICD-10-CM | POA: Diagnosis present

## 2022-11-15 MED ORDER — OCTREOTIDE ACETATE 20 MG IM KIT
20.0000 mg | PACK | INTRAMUSCULAR | Status: DC
  Administered 2022-11-15: 20 mg via INTRAMUSCULAR
  Filled 2022-11-15: qty 1

## 2022-11-19 ENCOUNTER — Ambulatory Visit (HOSPITAL_COMMUNITY): Payer: Self-pay | Admitting: Pharmacist

## 2022-11-19 LAB — POCT INR: INR: 2 (ref 2.0–3.0)

## 2022-11-26 ENCOUNTER — Ambulatory Visit (HOSPITAL_COMMUNITY): Payer: Self-pay | Admitting: Pharmacist

## 2022-11-26 LAB — POCT INR: INR: 1.9 — AB (ref 2.0–3.0)

## 2022-12-03 ENCOUNTER — Ambulatory Visit (HOSPITAL_COMMUNITY): Payer: Self-pay | Admitting: Pharmacist

## 2022-12-03 LAB — POCT INR: INR: 2.1 (ref 2.0–3.0)

## 2022-12-04 ENCOUNTER — Other Ambulatory Visit (HOSPITAL_COMMUNITY): Payer: Self-pay

## 2022-12-04 DIAGNOSIS — Z95811 Presence of heart assist device: Secondary | ICD-10-CM

## 2022-12-04 DIAGNOSIS — Z7901 Long term (current) use of anticoagulants: Secondary | ICD-10-CM

## 2022-12-06 ENCOUNTER — Ambulatory Visit (HOSPITAL_COMMUNITY): Payer: Self-pay | Admitting: Pharmacist

## 2022-12-06 ENCOUNTER — Ambulatory Visit (HOSPITAL_COMMUNITY)
Admission: RE | Admit: 2022-12-06 | Discharge: 2022-12-06 | Disposition: A | Discharge status: Home / Self Care | Payer: Medicare HMO | Source: Ambulatory Visit | Attending: Cardiology | Admitting: Cardiology

## 2022-12-06 DIAGNOSIS — Z86718 Personal history of other venous thrombosis and embolism: Secondary | ICD-10-CM | POA: Insufficient documentation

## 2022-12-06 DIAGNOSIS — A4901 Methicillin susceptible Staphylococcus aureus infection, unspecified site: Secondary | ICD-10-CM | POA: Diagnosis not present

## 2022-12-06 DIAGNOSIS — L299 Pruritus, unspecified: Secondary | ICD-10-CM

## 2022-12-06 DIAGNOSIS — F17200 Nicotine dependence, unspecified, uncomplicated: Secondary | ICD-10-CM | POA: Diagnosis not present

## 2022-12-06 DIAGNOSIS — T827XXD Infection and inflammatory reaction due to other cardiac and vascular devices, implants and grafts, subsequent encounter: Secondary | ICD-10-CM | POA: Insufficient documentation

## 2022-12-06 DIAGNOSIS — I5022 Chronic systolic (congestive) heart failure: Secondary | ICD-10-CM | POA: Insufficient documentation

## 2022-12-06 DIAGNOSIS — I159 Secondary hypertension, unspecified: Secondary | ICD-10-CM | POA: Diagnosis not present

## 2022-12-06 DIAGNOSIS — Z95811 Presence of heart assist device: Secondary | ICD-10-CM

## 2022-12-06 DIAGNOSIS — Z4509 Encounter for adjustment and management of other cardiac device: Secondary | ICD-10-CM | POA: Insufficient documentation

## 2022-12-06 DIAGNOSIS — G4709 Other insomnia: Secondary | ICD-10-CM

## 2022-12-06 DIAGNOSIS — E119 Type 2 diabetes mellitus without complications: Secondary | ICD-10-CM | POA: Diagnosis not present

## 2022-12-06 DIAGNOSIS — Z7901 Long term (current) use of anticoagulants: Secondary | ICD-10-CM | POA: Insufficient documentation

## 2022-12-06 DIAGNOSIS — G4733 Obstructive sleep apnea (adult) (pediatric): Secondary | ICD-10-CM | POA: Insufficient documentation

## 2022-12-06 DIAGNOSIS — K922 Gastrointestinal hemorrhage, unspecified: Secondary | ICD-10-CM | POA: Diagnosis not present

## 2022-12-06 DIAGNOSIS — Z79899 Other long term (current) drug therapy: Secondary | ICD-10-CM | POA: Insufficient documentation

## 2022-12-06 DIAGNOSIS — Z792 Long term (current) use of antibiotics: Secondary | ICD-10-CM | POA: Insufficient documentation

## 2022-12-06 LAB — CBC
HCT: 40.2 % (ref 39.0–52.0)
Hemoglobin: 13.1 g/dL (ref 13.0–17.0)
MCH: 28.4 pg (ref 26.0–34.0)
MCHC: 32.6 g/dL (ref 30.0–36.0)
MCV: 87 fL (ref 80.0–100.0)
Platelets: 216 10*3/uL (ref 150–400)
RBC: 4.62 MIL/uL (ref 4.22–5.81)
RDW: 14.3 % (ref 11.5–15.5)
WBC: 8.6 10*3/uL (ref 4.0–10.5)
nRBC: 0 % (ref 0.0–0.2)

## 2022-12-06 LAB — BASIC METABOLIC PANEL
Anion gap: 9 (ref 5–15)
BUN: 10 mg/dL (ref 8–23)
CO2: 24 mmol/L (ref 22–32)
Calcium: 8.9 mg/dL (ref 8.9–10.3)
Chloride: 106 mmol/L (ref 98–111)
Creatinine, Ser: 1.23 mg/dL (ref 0.61–1.24)
GFR, Estimated: >60 mL/min (ref >60)
Glucose, Bld: 147 mg/dL — ABNORMAL HIGH (ref 70–99)
Potassium: 4.2 mmol/L (ref 3.5–5.1)
Sodium: 139 mmol/L (ref 135–145)

## 2022-12-06 LAB — PROTIME-INR
INR: 1.3 — ABNORMAL HIGH (ref 0.8–1.2)
Prothrombin Time: 15.9 seconds — ABNORMAL HIGH (ref 11.4–15.2)

## 2022-12-06 LAB — LACTATE DEHYDROGENASE: LDH: 195 U/L — ABNORMAL HIGH (ref 98–192)

## 2022-12-06 MED ORDER — HYDRALAZINE HCL 100 MG PO TABS: 100.0000 mg | ORAL_TABLET | Freq: Three times a day (TID) | ORAL | 6 refills | Status: DC

## 2022-12-06 MED ORDER — PANTOPRAZOLE SODIUM 40 MG PO TBEC: 40.0000 mg | DELAYED_RELEASE_TABLET | Freq: Every day | ORAL | 3 refills | Status: DC

## 2022-12-06 MED ORDER — ZOLPIDEM TARTRATE 5 MG PO TABS: 5.0000 mg | ORAL_TABLET | Freq: Every day | ORAL | 5 refills | Status: DC

## 2022-12-06 NOTE — Patient Instructions (Signed)
Return to clinic in 2 months  Call LVAD Coordinator with any driveline or equipment issues Lauren will call you with Coumadin changes

## 2022-12-06 NOTE — Progress Notes (Addendum)
Patient presents for 2 mo f/u in [facility] today with his wife [name]. Reports no problems with VAD equipment or concerns with drive line.  Pt reports he has been feeling great. Denies lightheadedness, dizziness, falls, heart failure symptoms, and signs of bleeding. Pt endorses good appetite and is staying active.  Pt continues to smoke is not taking Wellbutrin or Chantix as prescribed for smoking cessation. Discussed again need for total documented cessation in order to proceed with possible transplant evaluation.   [name] is changing his drive line dressing weekly using daily kit. Remains on Keflex TID per ID. [name] states that he has scant amount of drainage each week when she changes dressing. Pt has some skin excoriation where anchor was previously. This area is OTA. Pt given cath grip anchors in clinic today in hopes these will not cause skin irritation.  Receiving monthly Octreotide injections.   Vital Signs: Doppler Pressure: 112 Automatc BP: 107/76 (87) HR: 67 SPO2: 98% RA   Weight: 205.6 lbs w/o equipment Last weight: 204 lbs w/o equipment  VAD Indication: Destination Therapy due to smoking status   VAD interrogation & Equipment Management: Speed: 5800 Flow: 4.3 Power: 4.6 w    PI: 5.1 Hct: 39   Alarms: none Events: 15-20 PI events daily Fixed speed: 5800 Low speed limit: 5500  Primary Controller:  Replace back up battery in 17 months. Back up controller:   Replace back up battery in 7 months.    Annual Equipment Maintenance on UBC/PM was performed on 08/05/21.  I reviewed the LVAD parameters from today and compared the results to the patient's prior recorded data. LVAD interrogation was NEGATIVE for significant power changes, NEGATIVE for clinical alarms and NEGATIVE for PI events/speed drops. No programming changes were made and pump is functioning within specified parameters. Pt is performing daily controller and system monitor self tests along with completing  weekly and monthly maintenance for LVAD equipment.  LVAD equipment check completed and is in good working order. Back-up equipment present.   Abdominal incision care: Closed. No longer requires any wound care.   Exit Site Care: Drive line is being maintained weekly by [employer]. Gauze dressing with anchor intact and accurately applied. Continue weekly dressing changes. Pt provided with 10 anchors, adhesive remover and cotton tip applicators for home use.    Device: N/A   BP & Labs:  Doppler 112- Doppler is reflecting modified systolic   Hgb 60.7 - No S/S of bleeding. Specifically denies BRBPR.    LDH stable at 195 with established baseline of 150 - 220. Denies tea-colored urine. No power elevations noted on interrogation.    Patient Instructions: Return to clinic in 2 months  Call LVAD Coordinator with any driveline or equipment issues Lauren will call you with Coumadin changes  Tanda Rockers RN,BSN VAD Coordinator  Office: [phone]  24/7 Pager: [phone]   62 y.o. with history of nonischemic cardiomyopathy, RLE DVT, cirrhosis, smoking, and OSA returns for followup of CHF/LVAD placement.  Cardiomyopathy was diagnosed in 6/19 in [place], [place] at that time showed low output.  He was admitted to [facility] in 9/19 with low output HF and was started on milrinone and diuresed.  Unable to wean off milrinone.  He had a degree of RV failure, but this improved on milrinone.  Valvular heart disease also looked better with milrinone and diuresis.  On 08/03/18, he had Heartmate 3 LVAD placed.  Speed was optimized by ramp echo post-op.  Post-op course was relatively unremarkable.  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  No abscess on CT.  He was started on cefazolin IV for 6 wks.  BP-active meds decreased with low MAP.   He was admitted in 3/21 with subxiphoid abscess and cellulitis.  CT showed collection  along the course of the driveline.  There was no evidence for sternal osteomyelitis.  Abscess was debrided, MSSA grew from wound cultures.  Wound vac was placed.  Patient was started on cefazolin, which was recently stopped after completing 8 wks.   Patient was re-admitted later in 3/21 with facial Zoster.  He was treated with acyclovir and this has resolved.   He was admitted in 6/21 with bleeding from the site of the subxiphoid abscess as well as cellulitis.  He required multiple units of PRBCs.  He went to the OR initially for I&D.  Blood and wound cultures grew MSSA.  He then went back to the OR for rectus flap over wound site (plastics).  He was started on cefazolin and rifabutin, has PICC.   He was admitted in 7/21 with GI bleeding, he had 6 units PRBCs.  EGD showed duodenal AVMs, treated with APC.  INR goal with warfarin lowered to 1.8-2.3.   Echo in 5/22 was a difficult study with EF 25-30%, mild LVH, aortic valve difficult to visualize but appears to open with each beat, mildly dilated RV with moderately decreased systolic function.  Echo in 7/23 was a very difficult study .  He returns for LVAD followup.   Still smoking 2-3 cigarettes/day, has failed Wellbutrin and Chantix.  BP controlled.  Driveline looks ok.  No significant exertional dyspnea.  No orthopnea/PND.  Generally doing whatever he wants. Weight up 3 lbs, ate well over the holidays.   Denies LVAD alarms.  Reports taking Coumadin as prescribed and adherence to anticoagulation based dietary restrictions.  Denies bright red blood per rectum or melena, no dark urine or hematuria.    Labs (9/19): LDH 210, INR 2.17, WBCs 17.1 => 15, hgb 9.7, creatinine 0.72 Labs (10/19): K 4.3, creatinine 0.78 => 0.83, hgb 10.2 Labs (11/19): creatinine 0.79, hgb 10 Labs (2/20): K 3.9, creatinine 0.79 Labs (4/20): K 4, creatinine 1.15, hgb 11.4, LDH 199, INR 1.9 Labs (5/20): hgb 10.4, WBCs 8.5 Labs (6/20): K 4.2, creatinine 1.06, hgb 11.2 Labs  (8/20): k 3.7, creatinine 1.0, hgb 11.5, LDH 186 Labs (10/20): K 3.5, creatinine 0.93, INR 2, LDH 221 Labs (12/20): hgb 15.6, K 3.7, creatinine 1.02, LDH 250 Labs (4/21): K 4.1, creatinine 0.86, hgb 11.1 Labs (6/21): K 4.1, creatinine 0.76, hgb 10, WBCs 11.2 Labs (8/21): hgb 10.6 Labs (10/21): hgb 13.2, creatinine 1.17 Labs (7/22): hgb 8, LDH 372, INR 1.9, creatinine 0.95 Labs (8/22): hgb 12.6 Labs (9/22): K 3.9, creatinine 1.1 Labs (11/22): creatinine 1.01 Labs (5/23): K 4.4, creatinine 0.72, hgb 13.3, LDH 214 Labs (7/23): K 3.8, creatinine 0.96 Labs (11/23): K 3.9, creatinine 1.02  PMH: 1. Degenerative disc disease.  2. GERD 3. Chronic systolic CHF:  Nonischemic cardiomyopathy.  Dilated cardiomyopathy diagnosed 6/19 in [place].  LHC/RHC in 7/19 showed elevated filling pressures, low cardiac output, and no significant CAD.   - Echo (9/19): Severe LV dilation with EF 10-20%, moderate-severe MR, severely dilated RV with mildly decreased systolic function, severe TR.  - Cardiac MRI (9/19): EF 14%, moderate dilated LV, severely dilated RV with mod-severe systolic function and EF 72%, nonspecific RV insertion site LGE.  -  RHC (5/19) on milrinone 0.375: mean RA 8, PA 40/19, mean PCWP 12, PAPi 2.65, CI 2.71.  - Echo (9/19) on milrinone and diuresed): EF 20-25% with moderate LV dilation, moderately dilated RV with mildly decreased systolic function, mild-moderate MR, moderate TR, PASP 51 mmHg.  Cannot rule out noncompaction.  - Heartmate 3 LVAD placement in 9/19.  - Echo (5/22): EF 25-30%, mild LVH, aortic valve difficult to visualize but appears to open with each beat, mildly dilated RV with moderately decreased systolic function. 4. OSA: CPAP use.  5. Prior smoker 6. Type 2 diabetes 7. Hyperlipidemia.  8. H/o NSVT 9. 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  10. RLE DVT: found in 9/19.  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  Subxiphoid abscess involving collection along driveline in 3/21, MSSA.  6/21 Rectus flap to cover abscess site.  12. Facial Zoster 3/21 13. 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   Current Outpatient Medications  Medication Sig Dispense Refill   acetaminophen (TYLENOL) 500 MG tablet Take 1,000 mg by mouth every 6 (six) hours as needed for mild pain.     carbamide peroxide (DEBROX) 6.5 % OTIC solution Place 5 drops into both ears 3 (three) times daily. 15 mL 0   carvedilol (COREG) 6.25 MG tablet Take 1 tablet (6.25 mg total) by mouth 2 (two) times daily with a meal. 180 tablet 3   cephALEXin (KEFLEX) 500 MG capsule Take 1 capsule (500 mg total) by mouth 3 (three) times daily. 90 capsule 11   cyanocobalamin 500 MCG tablet Take 1,000 mcg by mouth daily.      docusate sodium (COLACE) 100 MG capsule Take 2 capsules (200 mg total) by mouth daily as needed for mild constipation. 10 capsule 0   fluticasone (FLONASE) 50 MCG/ACT nasal spray Place 1 spray into both nostrils 2 (two) times daily as needed for allergies. 9.9 mL 5   gabapentin (NEURONTIN) 300 MG capsule Take 2 capsules (600 mg total) by mouth 4 (four) times daily. 240 capsule 8   hydrOXYzine (ATARAX) 50 MG tablet Take 1 tablet (50 mg total) by mouth 3 (three) times daily as needed for itching. 60 tablet 3   Ipratropium-Albuterol (COMBIVENT) 20-100 MCG/ACT AERS respimat Inhale 1 puff into the lungs every 6 (six) hours as needed for wheezing or shortness of breath. 4 g 5   nystatin cream (MYCOSTATIN) Apply 1 Application topically 3 (three) times daily. 30 g 0   sacubitril-valsartan (ENTRESTO) 97-103 MG Take 1 tablet by mouth 2 (two) times daily. 180 tablet 3   sildenafil (VIAGRA) 100 MG tablet Take 0.5 tablets (50 mg total) by mouth daily as needed for erectile dysfunction. 10 tablet 6   spironolactone (ALDACTONE) 25 MG tablet Take 2 tablets (50 mg total) by mouth daily. 180 tablet 3   thiamine 100 MG tablet Take 1 tablet  (100 mg total) by mouth daily. 30 tablet 0   warfarin (COUMADIN) 5 MG tablet TAKE 1/2 (ONE-HALF) TABLET BY MOUTH ONCE DAILY EVERY TUESDAY AND 1 ONCE DAILY ON ALL OTHER DAYS OR AS INSTRUCTED BY [facility] CLINIC 30 tablet 6   Zinc Gluconate 100 MG TABS Take 1 tablet (100 mg total) by mouth daily. 90 tablet 3   buPROPion (WELLBUTRIN SR) 150 MG 12 hr tablet Start by taking 1 tablet daily for three days; then increase to twice a daily.  150mg  once daily for 3 days; 150mg  twice daily (Patient not taking: Reported on 12/06/2022)  90 tablet 3   hydrALAZINE (APRESOLINE) 100 MG tablet Take 1 tablet (100 mg total) by mouth 3 (three) times daily. 90 tablet 6   pantoprazole (PROTONIX) 40 MG tablet Take 1 tablet (40 mg total) by mouth daily. 90 tablet 3   varenicline (CHANTIX CONTINUING MONTH PAK) 1 MG tablet Take 1 tablet (1 mg total) by mouth 2 (two) times daily. (Patient not taking: Reported on 12/06/2022) 60 tablet 3   zolpidem (AMBIEN) 5 MG tablet Take 1 tablet (5 mg total) by mouth at bedtime. 30 tablet 5   No current facility-administered medications for this encounter.    Chlorhexidine and Other  REVIEW OF SYSTEMS: All systems negative except as listed in HPI, PMH and Problem list.   LVAD INTERROGATION:  Please see LVAD nurse's note above for details.   I reviewed the LVAD parameters from today, and compared the results to the patient's prior recorded data.  No programming changes were made.  The LVAD is functioning within specified parameters.  The patient performs LVAD self-test daily.  LVAD interrogation was negative for any significant power changes, alarms or PI events/speed drops.  LVAD equipment check completed and is in good working order.  Back-up equipment present.   LVAD education done on emergency procedures and precautions and reviewed exit site care.   MAP 87  Physical Exam: General: Well appearing this am. NAD.  HEENT: Normal. Neck: Supple, JVP 7-8 cm. Carotids OK.  Cardiac:  Mechanical  heart sounds with LVAD hum present.  Lungs:  CTAB, normal effort.  Abdomen:  NT, ND, no HSM. No bruits or masses. +BS  LVAD exit site: Well-healed and incorporated. Dressing dry and intact. No erythema or drainage. Stabilization device present and accurately applied. Driveline dressing changed daily per sterile technique. Extremities:  Warm and dry. No cyanosis, clubbing, rash, or edema.  Neuro:  Alert & oriented x 3. Cranial nerves grossly intact. Moves all 4 extremities w/o difficulty. Affect pleasant      ASSESSMENT AND PLAN:   1. 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  LVAD parameters stable.  NYHA class I-II.   MAP controlled, not volume overloaded on exam.  - LDH/CBC/BMET sent and pending.  - Continue spironolactone 50 mg daily.   - Continue Entresto 97/103 bid.   - Continue hydralazine 100 mg tid.  - Continue Coreg 6.25 mg bid.  - He is off ASA with GI bleeding.  - Continue warfarin for INR 1.8-2.3.    - Should be transplant candidate eventually if he can quit smoking.  Will make transplant clinic referral when he is off totally.  2. Smoking: Still smoking.  He tried Chantix and Wellbutrin but they did not work.   3. RLE DVT: On warfarin for LVAD.  4. OSA: Continue CPAP.   5. Type II diabetes: Per PCP.  6. 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  Site improved.  - He is on long-term Keflex for MSSA suppression.  Followup with ID.  8. 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 No overt bleeding.   - CBC today.  - Off ASA, INR goal now 1.8-2.3.  - Continue monthly octreotide injections.   Followup in 2 months    Marca Ancona 12/06/2022

## 2022-12-10 ENCOUNTER — Ambulatory Visit (HOSPITAL_COMMUNITY): Payer: Self-pay | Admitting: Pharmacist

## 2022-12-10 LAB — POCT INR: INR: 1.9 — AB (ref 2.0–3.0)

## 2022-12-13 ENCOUNTER — Encounter (HOSPITAL_COMMUNITY)
Admission: RE | Admit: 2022-12-13 | Discharge: 2022-12-13 | Disposition: A | Discharge status: Home / Self Care | Payer: Medicare HMO | Source: Ambulatory Visit | Attending: Cardiology | Admitting: Cardiology

## 2022-12-13 DIAGNOSIS — K31811 Angiodysplasia of stomach and duodenum with bleeding: Secondary | ICD-10-CM | POA: Insufficient documentation

## 2022-12-13 DIAGNOSIS — Z95811 Presence of heart assist device: Secondary | ICD-10-CM | POA: Diagnosis present

## 2022-12-13 MED ORDER — OCTREOTIDE ACETATE 20 MG IM KIT
20.0000 mg | PACK | INTRAMUSCULAR | Status: DC
  Administered 2022-12-13: 20 mg via INTRAMUSCULAR
  Filled 2022-12-13: qty 1

## 2022-12-17 ENCOUNTER — Ambulatory Visit (HOSPITAL_COMMUNITY): Payer: Self-pay | Admitting: Pharmacist

## 2022-12-17 LAB — POCT INR: INR: 1.9 — AB (ref 2.0–3.0)

## 2022-12-24 ENCOUNTER — Ambulatory Visit (HOSPITAL_COMMUNITY): Payer: Self-pay | Admitting: Pharmacist

## 2022-12-24 LAB — POCT INR: INR: 2 (ref 2.0–3.0)

## 2022-12-31 ENCOUNTER — Ambulatory Visit (HOSPITAL_COMMUNITY): Payer: Self-pay | Admitting: Pharmacist

## 2022-12-31 LAB — POCT INR: INR: 2 (ref 2.0–3.0)

## 2023-01-07 ENCOUNTER — Ambulatory Visit (HOSPITAL_COMMUNITY): Payer: Self-pay | Admitting: Pharmacist

## 2023-01-07 LAB — POCT INR: INR: 1.9 — AB (ref 2.0–3.0)

## 2023-01-10 ENCOUNTER — Ambulatory Visit (HOSPITAL_COMMUNITY)
Admission: RE | Admit: 2023-01-10 | Discharge: 2023-01-10 | Disposition: A | Discharge status: Home / Self Care | Payer: Medicare HMO | Source: Ambulatory Visit | Attending: Cardiology | Admitting: Cardiology

## 2023-01-10 DIAGNOSIS — K31811 Angiodysplasia of stomach and duodenum with bleeding: Secondary | ICD-10-CM | POA: Diagnosis present

## 2023-01-10 DIAGNOSIS — Z95811 Presence of heart assist device: Secondary | ICD-10-CM | POA: Insufficient documentation

## 2023-01-10 MED ORDER — OCTREOTIDE ACETATE 20 MG IM KIT
20.0000 mg | PACK | INTRAMUSCULAR | Status: DC
  Administered 2023-01-10: 20 mg via INTRAMUSCULAR
  Filled 2023-01-10: qty 1

## 2023-01-14 ENCOUNTER — Ambulatory Visit (HOSPITAL_COMMUNITY): Payer: Self-pay | Admitting: Pharmacist

## 2023-01-14 LAB — POCT INR: INR: 2 (ref 2.0–3.0)

## 2023-01-21 ENCOUNTER — Ambulatory Visit (HOSPITAL_COMMUNITY): Payer: Self-pay | Admitting: Pharmacist

## 2023-01-21 LAB — POCT INR: INR: 2.1 (ref 2.0–3.0)

## 2023-01-27 ENCOUNTER — Ambulatory Visit (HOSPITAL_COMMUNITY)
Admission: RE | Admit: 2023-01-27 | Discharge: 2023-01-27 | Disposition: A | Discharge status: Home / Self Care | Payer: Medicare HMO | Source: Ambulatory Visit | Attending: Cardiology | Admitting: Cardiology

## 2023-01-27 ENCOUNTER — Encounter (HOSPITAL_COMMUNITY): Payer: Self-pay

## 2023-01-27 ENCOUNTER — Ambulatory Visit (HOSPITAL_COMMUNITY): Payer: Self-pay | Admitting: Pharmacist

## 2023-01-27 VITALS — BP 112/76 | HR 76

## 2023-01-27 DIAGNOSIS — I159 Secondary hypertension, unspecified: Secondary | ICD-10-CM

## 2023-01-27 DIAGNOSIS — Z7901 Long term (current) use of anticoagulants: Secondary | ICD-10-CM | POA: Diagnosis not present

## 2023-01-27 DIAGNOSIS — I110 Hypertensive heart disease with heart failure: Secondary | ICD-10-CM | POA: Insufficient documentation

## 2023-01-27 DIAGNOSIS — I10 Essential (primary) hypertension: Secondary | ICD-10-CM

## 2023-01-27 DIAGNOSIS — Z95811 Presence of heart assist device: Secondary | ICD-10-CM | POA: Insufficient documentation

## 2023-01-27 DIAGNOSIS — I5022 Chronic systolic (congestive) heart failure: Secondary | ICD-10-CM

## 2023-01-27 LAB — BASIC METABOLIC PANEL
Anion gap: 9 (ref 5–15)
BUN: 35 mg/dL — ABNORMAL HIGH (ref 8–23)
CO2: 15 mmol/L — ABNORMAL LOW (ref 22–32)
Calcium: 9.3 mg/dL (ref 8.9–10.3)
Chloride: 112 mmol/L — ABNORMAL HIGH (ref 98–111)
Creatinine, Ser: 1.54 mg/dL — ABNORMAL HIGH (ref 0.61–1.24)
GFR, Estimated: 50 mL/min — ABNORMAL LOW (ref >60)
Glucose, Bld: 71 mg/dL (ref 70–99)
Potassium: 5.4 mmol/L — ABNORMAL HIGH (ref 3.5–5.1)
Sodium: 136 mmol/L (ref 135–145)

## 2023-01-27 LAB — CBC
HCT: 39.5 % (ref 39.0–52.0)
Hemoglobin: 13.4 g/dL (ref 13.0–17.0)
MCH: 29.2 pg (ref 26.0–34.0)
MCHC: 33.9 g/dL (ref 30.0–36.0)
MCV: 86.1 fL (ref 80.0–100.0)
Platelets: 200 10*3/uL (ref 150–400)
RBC: 4.59 MIL/uL (ref 4.22–5.81)
RDW: 14.2 % (ref 11.5–15.5)
WBC: 10.5 10*3/uL (ref 4.0–10.5)
nRBC: 0 % (ref 0.0–0.2)

## 2023-01-27 LAB — LACTATE DEHYDROGENASE: LDH: 219 U/L — ABNORMAL HIGH (ref 98–192)

## 2023-01-27 LAB — PROTIME-INR
INR: 2 — ABNORMAL HIGH (ref 0.8–1.2)
Prothrombin Time: 22.1 seconds — ABNORMAL HIGH (ref 11.4–15.2)

## 2023-01-27 MED ORDER — SPIRONOLACTONE 25 MG PO TABS: 25.0000 mg | ORAL_TABLET | Freq: Every day | ORAL | 3 refills | Status: DC

## 2023-01-27 MED ORDER — ENTRESTO 24-26 MG PO TABS: 1.0000 | ORAL_TABLET | Freq: Two times a day (BID) | ORAL | 3 refills | Status: DC

## 2023-01-27 NOTE — Patient Instructions (Signed)
Please pick up new prescription for lower dose of Entresto Reduce Spironolactone to '25mg'$  daily Continue to drink fluid to prevent LOW FLOW alarms Call [facility] if alarming continues

## 2023-01-27 NOTE — Progress Notes (Signed)
Patient called the [facility] reporting intermittent alarming since last week. Asked patient to come to clinic for further evaluation. Patient states he is mostly asymptomatic when alarming occurs other than mild shortness of breath. Denies lightheadedness, dizziness, falls, shortness of breath, and signs of bleeding. States he has been staying well hydrated.  Upon interrogation of VAD patient having several suction events coupled with LOW FLOW alarms daily. 3 LOW FLOW events noted today and 4 yesterday.   Spoke with Dr. [name]. Plan to reduce Spironolactone to '25mg'$  daily and Entresto to 24-26 BID. Labs obtained at today's visit. Patient due to be seen in clinic 3/20 for full visit with Dr. [name]. Patient encouraged to continue to stay hydrated and eat something with salt in it.  Vital Signs: Automatc BP: 112/76 (94) HR: 76 NSR SPO2: 98% RA  VAD interrogation & Equipment Management: Speed: 5800 Flow: 4.0 Power: 4.6 w    PI: 6.5 Hct: 39   Alarms: 3 LOW FLOW today; 4 LOW FLOW alarms yesterday Fixed speed: 5800 Low speed limit: 5500   Patient Instructions:  Please pick up new prescription for lower dose of Entresto 24-'26mg'$  Reduce Spironolactone to '25mg'$  daily Continue to drink fluid to prevent LOW FLOW alarms Call [facility] Clinic if alarming continues  Bobbye Morton RN, BSN VAD Coordinator 24/7 Pager [phone]

## 2023-01-28 ENCOUNTER — Telehealth (HOSPITAL_COMMUNITY): Payer: Self-pay | Admitting: *Deleted

## 2023-01-28 DIAGNOSIS — I5022 Chronic systolic (congestive) heart failure: Secondary | ICD-10-CM

## 2023-01-28 DIAGNOSIS — I10 Essential (primary) hypertension: Secondary | ICD-10-CM

## 2023-01-28 DIAGNOSIS — Z95811 Presence of heart assist device: Secondary | ICD-10-CM

## 2023-01-28 MED ORDER — SPIRONOLACTONE 25 MG PO TABS: 12.5000 mg | ORAL_TABLET | Freq: Every day | ORAL | 3 refills | Status: DC

## 2023-01-28 NOTE — Telephone Encounter (Signed)
Per Dr [name]- K 5.4 Creatinine 1.54.  We cut back on spironolactone and Entresto today. Would hold spironolactone completely for 2 days then can restart at 12.5 daily. BMET 1 week.   Spoke with pt regarding need to hold Spironolactone x 2 days, then resume at 12.5 mg (1/2 tablet) daily. Will plan to recheck BMET at his full visit with Dr [name] next week. He verbalized understanding. Updated prescription sent to pt's pharmacy.   Emerson Monte RN [place] Coordinator  Office: [phone]  24/7 Pager: [phone]

## 2023-02-04 ENCOUNTER — Other Ambulatory Visit (HOSPITAL_COMMUNITY): Payer: Self-pay | Admitting: Unknown Physician Specialty

## 2023-02-04 DIAGNOSIS — Z7901 Long term (current) use of anticoagulants: Secondary | ICD-10-CM

## 2023-02-04 DIAGNOSIS — Z95811 Presence of heart assist device: Secondary | ICD-10-CM

## 2023-02-05 ENCOUNTER — Ambulatory Visit (HOSPITAL_COMMUNITY)
Admission: RE | Admit: 2023-02-05 | Discharge: 2023-02-05 | Disposition: A | Discharge status: Home / Self Care | Payer: Medicare HMO | Source: Ambulatory Visit | Attending: Cardiology | Admitting: Cardiology

## 2023-02-05 ENCOUNTER — Ambulatory Visit (HOSPITAL_COMMUNITY): Payer: Self-pay | Admitting: Pharmacist

## 2023-02-05 VITALS — BP 102/0 | HR 59 | Ht 71.0 in | Wt 202.0 lb

## 2023-02-05 DIAGNOSIS — F17200 Nicotine dependence, unspecified, uncomplicated: Secondary | ICD-10-CM | POA: Diagnosis not present

## 2023-02-05 DIAGNOSIS — Z86718 Personal history of other venous thrombosis and embolism: Secondary | ICD-10-CM | POA: Diagnosis not present

## 2023-02-05 DIAGNOSIS — E119 Type 2 diabetes mellitus without complications: Secondary | ICD-10-CM | POA: Insufficient documentation

## 2023-02-05 DIAGNOSIS — Z4801 Encounter for change or removal of surgical wound dressing: Secondary | ICD-10-CM | POA: Diagnosis present

## 2023-02-05 DIAGNOSIS — Z95811 Presence of heart assist device: Secondary | ICD-10-CM | POA: Diagnosis not present

## 2023-02-05 DIAGNOSIS — G4733 Obstructive sleep apnea (adult) (pediatric): Secondary | ICD-10-CM | POA: Insufficient documentation

## 2023-02-05 DIAGNOSIS — I420 Dilated cardiomyopathy: Secondary | ICD-10-CM | POA: Insufficient documentation

## 2023-02-05 DIAGNOSIS — I5022 Chronic systolic (congestive) heart failure: Secondary | ICD-10-CM | POA: Diagnosis not present

## 2023-02-05 DIAGNOSIS — Z7901 Long term (current) use of anticoagulants: Secondary | ICD-10-CM

## 2023-02-05 DIAGNOSIS — Z79899 Other long term (current) drug therapy: Secondary | ICD-10-CM | POA: Insufficient documentation

## 2023-02-05 LAB — LACTATE DEHYDROGENASE: LDH: 209 U/L — ABNORMAL HIGH (ref 98–192)

## 2023-02-05 LAB — CBC
HCT: 38.9 % — ABNORMAL LOW (ref 39.0–52.0)
Hemoglobin: 13.2 g/dL (ref 13.0–17.0)
MCH: 29.3 pg (ref 26.0–34.0)
MCHC: 33.9 g/dL (ref 30.0–36.0)
MCV: 86.3 fL (ref 80.0–100.0)
Platelets: 200 10*3/uL (ref 150–400)
RBC: 4.51 MIL/uL (ref 4.22–5.81)
RDW: 14.6 % (ref 11.5–15.5)
WBC: 7.6 10*3/uL (ref 4.0–10.5)
nRBC: 0 % (ref 0.0–0.2)

## 2023-02-05 LAB — COMPREHENSIVE METABOLIC PANEL
ALT: 16 U/L (ref 0–44)
AST: 19 U/L (ref 15–41)
Albumin: 4.1 g/dL (ref 3.5–5.0)
Alkaline Phosphatase: 94 U/L (ref 38–126)
Anion gap: 15 (ref 5–15)
BUN: 18 mg/dL (ref 8–23)
CO2: 21 mmol/L — ABNORMAL LOW (ref 22–32)
Calcium: 9.5 mg/dL (ref 8.9–10.3)
Chloride: 103 mmol/L (ref 98–111)
Creatinine, Ser: 1.43 mg/dL — ABNORMAL HIGH (ref 0.61–1.24)
GFR, Estimated: 55 mL/min — ABNORMAL LOW (ref >60)
Glucose, Bld: 160 mg/dL — ABNORMAL HIGH (ref 70–99)
Potassium: 4.3 mmol/L (ref 3.5–5.1)
Sodium: 139 mmol/L (ref 135–145)
Total Bilirubin: 1 mg/dL (ref 0.3–1.2)
Total Protein: 6.7 g/dL (ref 6.5–8.1)

## 2023-02-05 LAB — PROTIME-INR
INR: 2.1 — ABNORMAL HIGH (ref 0.8–1.2)
Prothrombin Time: 23.2 seconds — ABNORMAL HIGH (ref 11.4–15.2)

## 2023-02-05 LAB — PREALBUMIN: Prealbumin: 32 mg/dL (ref 18–38)

## 2023-02-05 NOTE — Patient Instructions (Signed)
Return to clinic in 2 months with an echo Call LVAD Coordinator with any driveline or equipment issues Lauren will call you with Coumadin changes

## 2023-02-05 NOTE — Progress Notes (Addendum)
Patient presents for 2 mo f/u in [facility] today with his wife [name]. Reports no problems with VAD equipment or concerns with drive line.  Pt reports he has been feeling great. Denies lightheadedness, dizziness, falls, heart failure symptoms, and signs of bleeding. Pt endorses good appetite and is staying active.  Pt called the office last week with low flow alarms. His spiro and entresto were decreased at that time. The low flow alarms have decreased. He did have low flow this morning at 0120 and yesterday morning at 0410. Pt states that he is eating and drinking plenty of water.  Pt continues to smoke and is not taking Wellbutrin or Chantix as prescribed for smoking cessation. Discussed again need for total documented cessation in order to proceed with possible transplant evaluation.   [name] is changing his drive line dressing weekly using daily kit. Remains on Keflex TID per ID. [name] states that he has scant amount of drainage each week when she changes dressing. Pt has some skin excoriation where anchor was previously. This area is OTA. Pt is not wearing an anchor today. Educated on the importance of wearing an anchor. See below for full driveline note.  Pts CR 1.43 today, down from 1.54. Still not at pts baseline. D/w Dr [name], pt was instructed to liberalize his fluid intake. Pt tells me that he has been restricting his fluid to 64 oz daily. Pt was informed that he does not need to restrict his fluid. He needs to hydrate as much as possible.   Receiving monthly Octreotide injections.   Vital Signs: Doppler Pressure: 102 Automatc BP: 115/64 (90) HR: 59 SPO2: 99% RA   Weight: 202 lbs w/o equipment Last weight: 205.6 lbs w/o equipment  VAD Indication: Destination Therapy due to smoking status   VAD interrogation & Equipment Management: Speed: 5800 Flow: 4.7 Power: 4.7 w    PI: 5.9 Hct: 39   Alarms: low flow alarms 3/12,13,14,19 and 20 mostly during the night Events: 50+  PI events daily Fixed speed: 5800 Low speed limit: 5500  Primary Controller:  Replace back up battery in 15 months. Back up controller:   Expiring. Replaced today with [id number]. Replace back up battery in 28 months.    Annual Equipment Maintenance on UBC/PM was performed on 08/05/22.  I reviewed the LVAD parameters from today and compared the results to the patient's prior recorded data. LVAD interrogation was NEGATIVE for significant power changes, NEGATIVE for clinical alarms and NEGATIVE for PI events/speed drops. No programming changes were made and pump is functioning within specified parameters. Pt is performing daily controller and system monitor self tests along with completing weekly and monthly maintenance for LVAD equipment.  LVAD equipment check completed and is in good working order. Back-up equipment present.   Exit Site Care: Drive line is being maintained weekly by [employer]. Existing VAD dressing removed and site care performed using sterile technique. Drive line exit site cleaned with Vashe solution x 2, allowed to dry, and gauze dressing applied. Exit site healed and incorporated, the velour is exposed about 2" at exit site. No redness, tenderness, drainage, foul odor or rash noted. Drive line anchor re-applied.  Continue weekly dressing changes. Pt provided with 10 anchors, 14 daily kits, adhesive remover and 2 small bottles of Vashe for home use.    Device: N/A   BP & Labs:  Doppler 102- Doppler is reflecting modified systolic   Hgb [id number] - No S/S of bleeding. Specifically denies BRBPR.    LDH  stable at 209 with established baseline of 150 - 220. Denies tea-colored urine. No power elevations noted on interrogation.   4.5 year Intermacs follow up completed including:  Quality of Life, KCCQ-12, and Neurocognitive trail making.   Pt completed 1400 feet during 6 minute walk.  Back up controller:  11V backup battery charged during this visit.  Patient Goals: Try to stop  smoking   [facility] Cardiomyopathy Questionnaire     02/05/2023   10:53 AM 08/05/2022    1:40 PM 07/30/2021    2:32 PM  KCCQ-12  1 a. Ability to shower/bathe Not at all limited Moderately limited Not at all limited  1 b. Ability to walk 1 block Not at all limited Slightly limited Not at all limited  1 c. Ability to hurry/jog Extremely limited Extremely limited Quite a bit limited  2. Edema feet/ankles/legs Never over the past 2 weeks Never over the past 2 weeks Never over the past 2 weeks  3. Limited by fatigue Less than once a week Less than once a week 1-2 times a week  4. Limited by dyspnea Less than once a week Less than once a week Less than once a week  5. Sitting up / on 3+ pillows Never over the past 2 weeks Never over the past 2 weeks Never over the past 2 weeks  6. Limited enjoyment of life Slightly limited Moderately limited Slightly limited  7. Rest of life w/ symptoms Mostly satisfied Mostly satisfied Mostly satisfied  8 a. Participation in hobbies Slightly limited Limited quite a bit Moderately limited  8 b. Participation in chores Slightly limited Slightly limited Limited quite a bit  8 c. Visiting family/friends Did not limit at all Slightly limited Slightly limited    Patient Instructions: Return to clinic in 2 months with an echo Please liberalize fluid intake Call LVAD Coordinator with any driveline or equipment issues Lauren will call you with Coumadin changes  Tanda Rockers RN,BSN VAD Coordinator  Office: [phone]  24/7 Pager: [phone]   62 y.o. with history of nonischemic cardiomyopathy, RLE DVT, cirrhosis, smoking, and OSA returns for followup of CHF/LVAD placement.  Cardiomyopathy was diagnosed in 6/19 in [place], [place] at that time showed low output.  He was admitted to [facility] in 9/19 with low output HF and was started on milrinone and diuresed.  Unable to wean off milrinone.  He had a degree of RV failure, but this improved on milrinone.  Valvular  heart disease also looked better with milrinone and diuresis.  On 08/03/18, he had Heartmate 3 LVAD placed.  Speed was optimized by ramp echo post-op.  Post-op course was relatively unremarkable.  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  No abscess on CT.  He was started on cefazolin IV for 6 wks.  BP-active meds decreased with low MAP.   He was admitted in 3/21 with subxiphoid abscess and cellulitis.  CT showed collection along the course of the driveline.  There was no evidence for sternal osteomyelitis.  Abscess was debrided, MSSA grew from wound cultures.  Wound vac was placed.  Patient was started on cefazolin, which was recently stopped after completing 8 wks.   Patient was re-admitted later in 3/21 with facial Zoster.  He was treated with acyclovir and this has resolved.   He was admitted in 6/21 with bleeding from the site of the subxiphoid abscess as well as cellulitis.  He  required multiple units of PRBCs.  He went to the OR initially for I&D.  Blood and wound cultures grew MSSA.  He then went back to the OR for rectus flap over wound site (plastics).  He was started on cefazolin and rifabutin, has PICC.   He was admitted in 7/21 with GI bleeding, he had 6 units PRBCs.  EGD showed duodenal AVMs, treated with APC.  INR goal with warfarin lowered to 1.8-2.3.   Echo in 5/22 was a difficult study with EF 25-30%, mild LVH, aortic valve difficult to visualize but appears to open with each beat, mildly dilated RV with moderately decreased systolic function.  Echo in 7/23 was a very difficult study .  He returns for LVAD followup.   After last appointment, spironolactone and Entresto were decreased due to low flows, AKI, and hyperkalemia.  He had a couple of low flow events since then but improved.  MAP 90 today.  Weight down 2 lbs. Still smoking 2-3 cigarettes/day, has failed Wellbutrin and Chantix.  Driveline  looks ok.  No exertional dyspnea.  No orthopnea/PND.  No lightheadedness.   Denies LVAD alarms.  Reports taking Coumadin as prescribed and adherence to anticoagulation based dietary restrictions.  Denies bright red blood per rectum or melena, no dark urine or hematuria.    Labs (9/19): LDH 210, INR 2.17, WBCs 17.1 => 15, hgb 9.7, creatinine 0.72 Labs (10/19): K 4.3, creatinine 0.78 => 0.83, hgb 10.2 Labs (11/19): creatinine 0.79, hgb 10 Labs (2/20): K 3.9, creatinine 0.79 Labs (4/20): K 4, creatinine 1.15, hgb 11.4, LDH 199, INR 1.9 Labs (5/20): hgb 10.4, WBCs 8.5 Labs (6/20): K 4.2, creatinine 1.06, hgb 11.2 Labs (8/20): k 3.7, creatinine 1.0, hgb 11.5, LDH 186 Labs (10/20): K 3.5, creatinine 0.93, INR 2, LDH 221 Labs (12/20): hgb 15.6, K 3.7, creatinine 1.02, LDH 250 Labs (4/21): K 4.1, creatinine 0.86, hgb 11.1 Labs (6/21): K 4.1, creatinine 0.76, hgb 10, WBCs 11.2 Labs (8/21): hgb 10.6 Labs (10/21): hgb 13.2, creatinine 1.17 Labs (7/22): hgb 8, LDH 372, INR 1.9, creatinine 0.95 Labs (8/22): hgb 12.6 Labs (9/22): K 3.9, creatinine 1.1 Labs (11/22): creatinine 1.01 Labs (5/23): K 4.4, creatinine 0.72, hgb 13.3, LDH 214 Labs (7/23): K 3.8, creatinine 0.96 Labs (11/23): K 3.9, creatinine 1.02 Labs (3/24): LDL 219, hgb 13.4, K 5.4, creatinine 1.54  PMH: 1. Degenerative disc disease.  2. GERD 3. Chronic systolic CHF:  Nonischemic cardiomyopathy.  Dilated cardiomyopathy diagnosed 6/19 in [place].  LHC/RHC in 7/19 showed elevated filling pressures, low cardiac output, and no significant CAD.   - Echo (9/19): Severe LV dilation with EF 10-20%, moderate-severe MR, severely dilated RV with mildly decreased systolic function, severe TR.  - Cardiac MRI (9/19): EF 14%, moderate dilated LV, severely dilated RV with mod-severe systolic function and EF [id number], nonspecific RV insertion site LGE.  - RHC (5/19) on milrinone 0.375: mean RA 8, PA 40/19, mean PCWP 12, PAPi 2.65, CI 2.71.  - Echo (9/19)  on milrinone and diuresed): EF 20-25% with moderate LV dilation, moderately dilated RV with mildly decreased systolic function, mild-moderate MR, moderate TR, PASP 51 mmHg.  Cannot rule out noncompaction.  - Heartmate 3 LVAD placement in 9/19.  - Echo (5/22): EF 25-30%, mild LVH, aortic valve difficult to visualize but appears to open with each beat, mildly dilated RV with moderately decreased systolic function. 4. OSA: CPAP use.  5. Prior smoker 6. Type 2 diabetes 7. Hyperlipidemia.  8. H/o NSVT 9. 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  10. RLE DVT: found in 9/19.  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 Subxiphoid abscess involving collection along driveline in [id number], MSSA.  6/21 Rectus flap to cover abscess site.  12. Facial Zoster 3/21 13. 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   Current Outpatient Medications  Medication Sig Dispense Refill   acetaminophen (TYLENOL) 500 MG tablet Take 1,000 mg by mouth every 6 (six) hours as needed for mild pain.     carbamide peroxide (DEBROX) 6.5 % OTIC solution Place 5 drops into both ears 3 (three) times daily. 15 mL 0   carvedilol (COREG) 6.25 MG tablet Take 1 tablet (6.25 mg total) by mouth 2 (two) times daily with a meal. 180 tablet 3   cephALEXin (KEFLEX) 500 MG capsule Take 1 capsule (500 mg total) by mouth 3 (three) times daily. 90 capsule 11   cyanocobalamin 500 MCG tablet Take 1,000 mcg by mouth daily.      docusate sodium (COLACE) 100 MG capsule Take 2 capsules (200 mg total) by mouth daily as needed for mild constipation. 10 capsule 0   fluticasone (FLONASE) 50 MCG/ACT nasal spray Place 1 spray into both nostrils 2 (two) times daily as needed for allergies. 9.9 mL 5   gabapentin (NEURONTIN) 300 MG capsule Take 2 capsules (600 mg total) by mouth 4 (four) times daily. 240 capsule 8   hydrALAZINE (APRESOLINE) 100 MG tablet Take 1 tablet (100 mg total) by mouth 3  (three) times daily. 90 tablet 6   hydrOXYzine (ATARAX) 50 MG tablet Take 1 tablet (50 mg total) by mouth 3 (three) times daily as needed for itching. 60 tablet 3   Ipratropium-Albuterol (COMBIVENT) 20-100 MCG/ACT AERS respimat Inhale 1 puff into the lungs every 6 (six) hours as needed for wheezing or shortness of breath. 4 g 5   nystatin cream (MYCOSTATIN) Apply 1 Application topically 3 (three) times daily. 30 g 0   pantoprazole (PROTONIX) 40 MG tablet Take 1 tablet (40 mg total) by mouth daily. 90 tablet 3   sacubitril-valsartan (ENTRESTO) 24-26 MG Take 1 tablet by mouth 2 (two) times daily. 60 tablet 3   sildenafil (VIAGRA) 100 MG tablet Take 0.5 tablets (50 mg total) by mouth daily as needed for erectile dysfunction. 10 tablet 6   spironolactone (ALDACTONE) 25 MG tablet Take 0.5 tablets (12.5 mg total) by mouth daily. 45 tablet 3   thiamine 100 MG tablet Take 1 tablet (100 mg total) by mouth daily. 30 tablet 0   warfarin (COUMADIN) 5 MG tablet TAKE 1/2 (ONE-HALF) TABLET BY MOUTH ONCE DAILY EVERY TUESDAY AND 1 ONCE DAILY ON ALL OTHER DAYS OR AS INSTRUCTED BY [facility] CLINIC 30 tablet 6   Zinc Gluconate 100 MG TABS Take 1 tablet (100 mg total) by mouth daily. 90 tablet 3   zolpidem (AMBIEN) 5 MG tablet Take 1 tablet (5 mg total) by mouth at bedtime. 30 tablet 5   No current facility-administered medications for this encounter.    Chlorhexidine and Other  REVIEW OF SYSTEMS: All systems negative except as listed in HPI, PMH and Problem list.   LVAD INTERROGATION:  Please see LVAD nurse's note above for details.   I reviewed the LVAD parameters from today, and compared the results to the patient's prior recorded data.  No programming changes were made.  The LVAD is functioning within specified parameters.  The patient performs LVAD self-test daily.  LVAD interrogation was negative for any  significant power changes, alarms or PI events/speed drops.  LVAD equipment check completed and is in good  working order.  Back-up equipment present.   LVAD education done on emergency procedures and precautions and reviewed exit site care.   MAP 90  Physical Exam: General: Well appearing this am. NAD.  HEENT: Normal. Neck: Supple, JVP 7-8 cm. Carotids OK.  Cardiac:  Mechanical heart sounds with LVAD hum present.  Lungs:  CTAB, normal effort.  Abdomen:  NT, ND, no HSM. No bruits or masses. +BS  LVAD exit site: Well-healed and incorporated. Dressing dry and intact. No erythema or drainage. Stabilization device present and accurately applied. Driveline dressing changed daily per sterile technique. Extremities:  Warm and dry. No cyanosis, clubbing, rash, or edema.  Neuro:  Alert & oriented x 3. Cranial nerves grossly intact. Moves all 4 extremities w/o difficulty. Affect pleasant      ASSESSMENT AND PLAN:   1. 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  LVAD parameters stable.  NYHA class I-II.   MAP controlled, volume status looks ok on exam. Recent hyperkalemia/AKI with spironolactone and Entresto cut back.  - BMET today.   - Continue spironolactone 12.5 mg daily.   - Continue Entresto 24/26 bid.   - Continue hydralazine 100 mg tid.  - Continue Coreg 6.25 mg bid.  - He is off ASA with GI bleeding.  - Continue warfarin for INR 1.8-2.3.    - Should be transplant candidate eventually if he can quit smoking.  Will make transplant clinic referral when he is off totally.  2. Smoking: Still smoking.  He tried Chantix and Wellbutrin but they did not work.   3. RLE DVT: On warfarin for LVAD.  4. OSA: Continue CPAP.   5. Type II diabetes: Per PCP.  6. 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  Site looks good today.   - He is on long-term Keflex for MSSA suppression.  Followup with ID.  8. 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 No overt bleeding.   - CBC today.  - Off  ASA, INR goal now 1.8-2.3.  - Continue monthly octreotide injections.   Followup in 2 months    Loralie Champagne 02/05/2023

## 2023-02-07 ENCOUNTER — Ambulatory Visit (HOSPITAL_COMMUNITY)
Admission: RE | Admit: 2023-02-07 | Discharge: 2023-02-07 | Disposition: A | Discharge status: Home / Self Care | Payer: Medicare HMO | Source: Ambulatory Visit | Attending: Cardiology | Admitting: Cardiology

## 2023-02-07 DIAGNOSIS — K31811 Angiodysplasia of stomach and duodenum with bleeding: Secondary | ICD-10-CM | POA: Diagnosis present

## 2023-02-07 DIAGNOSIS — Z95811 Presence of heart assist device: Secondary | ICD-10-CM | POA: Diagnosis present

## 2023-02-07 MED ORDER — OCTREOTIDE ACETATE 20 MG IM KIT
20.0000 mg | PACK | INTRAMUSCULAR | Status: DC
  Administered 2023-02-07: 20 mg via INTRAMUSCULAR
  Filled 2023-02-07: qty 1

## 2023-02-11 ENCOUNTER — Ambulatory Visit (HOSPITAL_COMMUNITY): Payer: Self-pay | Admitting: Pharmacist

## 2023-02-11 LAB — POCT INR: INR: 2 (ref 2.0–3.0)

## 2023-02-19 ENCOUNTER — Ambulatory Visit (HOSPITAL_COMMUNITY): Payer: Self-pay

## 2023-02-19 DIAGNOSIS — Z95811 Presence of heart assist device: Secondary | ICD-10-CM

## 2023-02-19 LAB — POCT INR: INR: 1.9 — AB (ref 2.0–3.0)

## 2023-02-25 ENCOUNTER — Ambulatory Visit (HOSPITAL_COMMUNITY): Payer: Self-pay

## 2023-02-25 LAB — POCT INR: INR: 2.1 (ref 2.0–3.0)

## 2023-02-27 ENCOUNTER — Other Ambulatory Visit (HOSPITAL_COMMUNITY): Payer: Self-pay | Admitting: Cardiology

## 2023-02-27 DIAGNOSIS — Z95811 Presence of heart assist device: Secondary | ICD-10-CM

## 2023-02-27 DIAGNOSIS — I5022 Chronic systolic (congestive) heart failure: Secondary | ICD-10-CM

## 2023-02-27 DIAGNOSIS — M797 Fibromyalgia: Secondary | ICD-10-CM

## 2023-03-05 ENCOUNTER — Ambulatory Visit (HOSPITAL_COMMUNITY): Payer: Self-pay | Admitting: Pharmacist

## 2023-03-05 LAB — POCT INR: INR: 1.9 — AB (ref 2.0–3.0)

## 2023-03-07 ENCOUNTER — Ambulatory Visit (HOSPITAL_COMMUNITY)
Admission: RE | Admit: 2023-03-07 | Discharge: 2023-03-07 | Disposition: A | Discharge status: Home / Self Care | Payer: Medicare HMO | Source: Ambulatory Visit | Attending: Cardiology | Admitting: Cardiology

## 2023-03-07 ENCOUNTER — Other Ambulatory Visit (HOSPITAL_COMMUNITY): Payer: Self-pay | Admitting: *Deleted

## 2023-03-07 DIAGNOSIS — K31811 Angiodysplasia of stomach and duodenum with bleeding: Secondary | ICD-10-CM | POA: Diagnosis present

## 2023-03-07 DIAGNOSIS — Z95811 Presence of heart assist device: Secondary | ICD-10-CM

## 2023-03-07 MED ORDER — OCTREOTIDE ACETATE 20 MG IM KIT
20.0000 mg | PACK | INTRAMUSCULAR | Status: DC
  Administered 2023-03-07: 20 mg via INTRAMUSCULAR
  Filled 2023-03-07: qty 1

## 2023-03-11 ENCOUNTER — Ambulatory Visit (HOSPITAL_COMMUNITY): Payer: Self-pay

## 2023-03-11 LAB — POCT INR: INR: 2.1 (ref 2.0–3.0)

## 2023-03-14 ENCOUNTER — Telehealth: Payer: Self-pay | Admitting: *Deleted

## 2023-03-19 ENCOUNTER — Ambulatory Visit (HOSPITAL_COMMUNITY): Payer: Self-pay | Admitting: Pharmacist

## 2023-03-19 ENCOUNTER — Telehealth (HOSPITAL_COMMUNITY): Payer: Self-pay

## 2023-03-19 LAB — POCT INR: INR: 2 (ref 2.0–3.0)

## 2023-03-19 NOTE — Telephone Encounter (Signed)
Theodore Welch called and said his INR this morning was 2.0.

## 2023-03-24 ENCOUNTER — Ambulatory Visit (HOSPITAL_COMMUNITY): Payer: Self-pay | Admitting: Pharmacist

## 2023-03-24 LAB — POCT INR: INR: 1.9 — AB (ref 2.0–3.0)

## 2023-04-02 ENCOUNTER — Ambulatory Visit (HOSPITAL_COMMUNITY): Payer: Self-pay | Admitting: Pharmacist

## 2023-04-02 LAB — POCT INR: INR: 2 (ref 2.0–3.0)

## 2023-04-04 ENCOUNTER — Ambulatory Visit (HOSPITAL_COMMUNITY)
Admission: RE | Admit: 2023-04-04 | Discharge: 2023-04-04 | Disposition: A | Discharge status: Home / Self Care | Payer: Medicare HMO | Source: Ambulatory Visit | Attending: Cardiology | Admitting: Cardiology

## 2023-04-04 DIAGNOSIS — K31811 Angiodysplasia of stomach and duodenum with bleeding: Secondary | ICD-10-CM | POA: Insufficient documentation

## 2023-04-04 DIAGNOSIS — Z95811 Presence of heart assist device: Secondary | ICD-10-CM | POA: Diagnosis not present

## 2023-04-04 MED ORDER — OCTREOTIDE ACETATE 20 MG IM KIT
20.0000 mg | PACK | INTRAMUSCULAR | Status: DC
  Administered 2023-04-04: 20 mg via INTRAMUSCULAR
  Filled 2023-04-04: qty 1

## 2023-04-08 ENCOUNTER — Other Ambulatory Visit (HOSPITAL_COMMUNITY): Payer: Self-pay | Admitting: Cardiology

## 2023-04-08 ENCOUNTER — Other Ambulatory Visit (HOSPITAL_COMMUNITY): Payer: Self-pay | Admitting: Unknown Physician Specialty

## 2023-04-08 DIAGNOSIS — I5022 Chronic systolic (congestive) heart failure: Secondary | ICD-10-CM

## 2023-04-08 DIAGNOSIS — Z95811 Presence of heart assist device: Secondary | ICD-10-CM

## 2023-04-08 DIAGNOSIS — M797 Fibromyalgia: Secondary | ICD-10-CM

## 2023-04-08 DIAGNOSIS — Z7901 Long term (current) use of anticoagulants: Secondary | ICD-10-CM

## 2023-04-09 ENCOUNTER — Ambulatory Visit (HOSPITAL_COMMUNITY)
Admission: RE | Admit: 2023-04-09 | Discharge: 2023-04-09 | Disposition: A | Discharge status: Home / Self Care | Payer: Medicare HMO | Source: Ambulatory Visit | Attending: Cardiology | Admitting: Cardiology

## 2023-04-09 ENCOUNTER — Encounter (HOSPITAL_COMMUNITY): Payer: Medicare HMO

## 2023-04-09 ENCOUNTER — Other Ambulatory Visit: Payer: Self-pay

## 2023-04-09 ENCOUNTER — Ambulatory Visit (HOSPITAL_BASED_OUTPATIENT_CLINIC_OR_DEPARTMENT_OTHER)
Admission: RE | Admit: 2023-04-09 | Discharge: 2023-04-09 | Disposition: A | Discharge status: Home / Self Care | Payer: Medicare HMO | Source: Ambulatory Visit | Attending: Cardiology | Admitting: Cardiology

## 2023-04-09 ENCOUNTER — Ambulatory Visit (HOSPITAL_COMMUNITY): Payer: Self-pay | Admitting: Pharmacist

## 2023-04-09 DIAGNOSIS — I10 Essential (primary) hypertension: Secondary | ICD-10-CM | POA: Diagnosis not present

## 2023-04-09 DIAGNOSIS — I110 Hypertensive heart disease with heart failure: Secondary | ICD-10-CM | POA: Diagnosis not present

## 2023-04-09 DIAGNOSIS — Z86718 Personal history of other venous thrombosis and embolism: Secondary | ICD-10-CM | POA: Diagnosis not present

## 2023-04-09 DIAGNOSIS — I5022 Chronic systolic (congestive) heart failure: Secondary | ICD-10-CM | POA: Diagnosis not present

## 2023-04-09 DIAGNOSIS — E119 Type 2 diabetes mellitus without complications: Secondary | ICD-10-CM | POA: Diagnosis not present

## 2023-04-09 DIAGNOSIS — Z7901 Long term (current) use of anticoagulants: Secondary | ICD-10-CM | POA: Diagnosis not present

## 2023-04-09 DIAGNOSIS — I3481 Nonrheumatic mitral (valve) annulus calcification: Secondary | ICD-10-CM | POA: Diagnosis not present

## 2023-04-09 DIAGNOSIS — Z95811 Presence of heart assist device: Secondary | ICD-10-CM

## 2023-04-09 DIAGNOSIS — K7460 Unspecified cirrhosis of liver: Secondary | ICD-10-CM | POA: Insufficient documentation

## 2023-04-09 DIAGNOSIS — I420 Dilated cardiomyopathy: Secondary | ICD-10-CM | POA: Insufficient documentation

## 2023-04-09 DIAGNOSIS — G4733 Obstructive sleep apnea (adult) (pediatric): Secondary | ICD-10-CM | POA: Insufficient documentation

## 2023-04-09 DIAGNOSIS — F17210 Nicotine dependence, cigarettes, uncomplicated: Secondary | ICD-10-CM | POA: Insufficient documentation

## 2023-04-09 DIAGNOSIS — Z79899 Other long term (current) drug therapy: Secondary | ICD-10-CM | POA: Insufficient documentation

## 2023-04-09 LAB — BASIC METABOLIC PANEL
Anion gap: 9 (ref 5–15)
BUN: 10 mg/dL (ref 8–23)
CO2: 22 mmol/L (ref 22–32)
Calcium: 8.9 mg/dL (ref 8.9–10.3)
Chloride: 108 mmol/L (ref 98–111)
Creatinine, Ser: 0.97 mg/dL (ref 0.61–1.24)
GFR, Estimated: >60 mL/min (ref >60)
Glucose, Bld: 150 mg/dL — ABNORMAL HIGH (ref 70–99)
Potassium: 3.9 mmol/L (ref 3.5–5.1)
Sodium: 139 mmol/L (ref 135–145)

## 2023-04-09 LAB — CBC
HCT: 38.5 % — ABNORMAL LOW (ref 39.0–52.0)
Hemoglobin: 13 g/dL (ref 13.0–17.0)
MCH: 29.6 pg (ref 26.0–34.0)
MCHC: 33.8 g/dL (ref 30.0–36.0)
MCV: 87.7 fL (ref 80.0–100.0)
Platelets: 204 10*3/uL (ref 150–400)
RBC: 4.39 MIL/uL (ref 4.22–5.81)
RDW: 14.1 % (ref 11.5–15.5)
WBC: 8.4 10*3/uL (ref 4.0–10.5)
nRBC: 0 % (ref 0.0–0.2)

## 2023-04-09 LAB — PROTIME-INR
INR: 1.9 — ABNORMAL HIGH (ref 0.8–1.2)
Prothrombin Time: 21.9 seconds — ABNORMAL HIGH (ref 11.4–15.2)

## 2023-04-09 LAB — LACTATE DEHYDROGENASE: LDH: 203 U/L — ABNORMAL HIGH (ref 98–192)

## 2023-04-09 LAB — ECHOCARDIOGRAM COMPLETE: Area-P 1/2: 2.44 cm2

## 2023-04-09 MED ORDER — SPIRONOLACTONE 25 MG PO TABS: 25.0000 mg | ORAL_TABLET | Freq: Every day | ORAL | 3 refills | Status: DC

## 2023-04-09 MED ORDER — AMLODIPINE BESYLATE 5 MG PO TABS: 5.0000 mg | ORAL_TABLET | Freq: Every day | ORAL | 3 refills | Status: DC

## 2023-04-09 NOTE — Patient Instructions (Signed)
Stop Coreg Start Amlodipine 5 mg daily Increase Spironolactone 25 mg daily Return to clinic in  2 weeks for BMET Return to clinic in 2 months  Lauren will call you with Coumadin changes

## 2023-04-09 NOTE — Progress Notes (Addendum)
Patient presents for 2 mo f/u in VAD Clinic today with his wife [name]. Reports no problems with VAD equipment or concerns with drive line.  Pt reports he has been feeling great. Denies lightheadedness, dizziness, falls, heart failure symptoms, and signs of bleeding. Pt endorses good appetite and is staying active.  Pt continues to smoke and is not taking Wellbutrin or Chantix as prescribed for smoking cessation. Discussed again need for total documented cessation in order to proceed with possible transplant evaluation.   [name] is changing his drive line dressing weekly using daily kit. Remains on Keflex TID per ID. [name] states that he has scant amount of drainage each week when she changes dressing.   BP up a bit today. HR 45. See below for plan.  Receiving monthly Octreotide injections.   Vital Signs: Doppler Pressure: 104 Automatc BP: 127/87 (100) HR: 45 SPO2: 98% RA   Weight: 205 lbs w/o equipment Last weight: 202 lbs w/o equipment  VAD Indication: Destination Therapy due to smoking status   VAD interrogation & Equipment Management: Speed: 5800 Flow: 4.4 Power: 4.6 w    PI: 5.8 Hct: 38   Alarms: few LV Events: 20-30 PI events daily Fixed speed: 5800 Low speed limit: 5500  Primary Controller:  Replace back up battery in 13 months. Back up controller:   Replace back up battery in 26 months.    Annual Equipment Maintenance on UBC/PM was performed on 08/05/22.  I reviewed the LVAD parameters from today and compared the results to the patient's prior recorded data. LVAD interrogation was NEGATIVE for significant power changes, NEGATIVE for clinical alarms and NEGATIVE for PI events/speed drops. No programming changes were made and pump is functioning within specified parameters. Pt is performing daily controller and system monitor self tests along with completing weekly and monthly maintenance for LVAD equipment.  LVAD equipment check completed and is in good working  order. Back-up equipment present.   Exit Site Care: Drive line is being maintained weekly by [name].  CDI.Marland Kitchen  Continue weekly dressing changes. Pt provided with 15 anchors, 14 daily kits, adhesive remover. Q-TIPS, and 3 boxes of large tegaderm.  Device: N/A   BP & Labs:  Doppler 104- Doppler is reflecting modified systolic   Hgb 13 - No S/S of bleeding. Specifically denies BRBPR.    LDH stable at 203 with established baseline of 150 - 220. Denies tea-colored urine. No power elevations noted on interrogation.     Patient Instructions: Stop Coreg Start Amlodipine 5 mg daily Increase Spironolactone 25 mg daily Return to clinic in  2 weeks for BMET Return to clinic in 2 months  Lauren will call you with Coumadin changes  [name] RN,BSN VAD Coordinator  Office: [phone]  24/7 Pager: [phone]   62 y.o. with history of nonischemic cardiomyopathy, RLE DVT, cirrhosis, smoking, and OSA returns for followup of CHF/LVAD placement.  Cardiomyopathy was diagnosed in 6/19 in [place], [place] at that time showed low output.  He was admitted to [facility] in 9/19 with low output HF and was started on milrinone and diuresed.  Unable to wean off milrinone.  He had a degree of RV failure, but this improved on milrinone.  Valvular heart disease also looked better with milrinone and diuresis.  On 08/03/18, he had Heartmate 3 LVAD placed.  Speed was optimized by ramp echo post-op.  Post-op course was relatively unremarkable.  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    He was admitted again 5/20 with recurrent  MSSA driveline infection.  No abscess on CT.  He was started on cefazolin IV for 6 wks.  BP-active meds decreased with low MAP.   He was admitted in 3/21 with subxiphoid abscess and cellulitis.  CT showed collection along the course of the driveline.  There was no evidence for sternal osteomyelitis.  Abscess was debrided, MSSA grew from wound cultures.  Wound vac was placed.   Patient was started on cefazolin, which was recently stopped after completing 8 wks.   Patient was re-admitted later in 3/21 with facial Zoster.  He was treated with acyclovir and this has resolved.   He was admitted in 6/21 with bleeding from the site of the subxiphoid abscess as well as cellulitis.  He required multiple units of PRBCs.  He went to the OR initially for I&D.  Blood and wound cultures grew MSSA.  He then went back to the OR for rectus flap over wound site (plastics).  He was started on cefazolin and rifabutin, has PICC.   He was admitted in 7/21 with GI bleeding, he had 6 units PRBCs.  EGD showed duodenal AVMs, treated with APC.  INR goal with warfarin lowered to 1.8-2.3.   Echo in 5/22 was a difficult study with EF 25-30%, mild LVH, aortic valve difficult to visualize but appears to open with each beat, mildly dilated RV with moderately decreased systolic function.  Echo in 7/23 was a very difficult study.  Echo was done today and reviewed, EF 20-25% with midline interventricular septum, RV moderately dilated with moderately decreased systolic function, IVC normal.   He returns for LVAD followup.   Still smoking a few cigarettes/day.  No dyspnea except with heavy exertion. Weight up 3 lbs.  Driveline site looks good, no drainage.  No orthopnea/PND.  MAP elevated. 1 low flow alarm since last visit.   Denies LVAD alarms.  Reports taking Coumadin as prescribed and adherence to anticoagulation based dietary restrictions.  Denies bright red blood per rectum or melena, no dark urine or hematuria.    ECG (personally reviewed): sinus brady at 45, inferolateral TWIs  Labs (9/19): LDH 210, INR 2.17, WBCs 17.1 => 15, hgb 9.7, creatinine 0.72 Labs (10/19): K 4.3, creatinine 0.78 => 0.83, hgb 10.2 Labs (11/19): creatinine 0.79, hgb 10 Labs (2/20): K 3.9, creatinine 0.79 Labs (4/20): K 4, creatinine 1.15, hgb 11.4, LDH 199, INR 1.9 Labs (5/20): hgb 10.4, WBCs 8.5 Labs (6/20): K 4.2,  creatinine 1.06, hgb 11.2 Labs (8/20): k 3.7, creatinine 1.0, hgb 11.5, LDH 186 Labs (10/20): K 3.5, creatinine 0.93, INR 2, LDH 221 Labs (12/20): hgb 15.6, K 3.7, creatinine 1.02, LDH 250 Labs (4/21): K 4.1, creatinine 0.86, hgb 11.1 Labs (6/21): K 4.1, creatinine 0.76, hgb 10, WBCs 11.2 Labs (8/21): hgb 10.6 Labs (10/21): hgb 13.2, creatinine 1.17 Labs (7/22): hgb 8, LDH 372, INR 1.9, creatinine 0.95 Labs (8/22): hgb 12.6 Labs (9/22): K 3.9, creatinine 1.1 Labs (11/22): creatinine 1.01 Labs (5/23): K 4.4, creatinine 0.72, hgb 13.3, LDH 214 Labs (7/23): K 3.8, creatinine 0.96 Labs (11/23): K 3.9, creatinine 1.02 Labs (3/24): LDL 219, hgb 13.4, K 5.4, creatinine 1.54 => 1.43 Labs (5/24): hgb 13  PMH: 1. Degenerative disc disease.  2. GERD 3. Chronic systolic CHF:  Nonischemic cardiomyopathy.  Dilated cardiomyopathy diagnosed 6/19 in [place].  LHC/RHC in 7/19 showed elevated filling pressures, low cardiac output, and no significant CAD.   - Echo (9/19): Severe LV dilation with EF 10-20%, moderate-severe MR, severely dilated RV with mildly decreased  systolic function, severe TR.  - Cardiac MRI (9/19): EF 14%, moderate dilated LV, severely dilated RV with mod-severe systolic function and EF 25%, nonspecific RV insertion site LGE.  - RHC (5/19) on milrinone 0.375: mean RA 8, PA 40/19, mean PCWP 12, PAPi 2.65, CI 2.71.  - Echo (9/19) on milrinone and diuresed): EF 20-25% with moderate LV dilation, moderately dilated RV with mildly decreased systolic function, mild-moderate MR, moderate TR, PASP 51 mmHg.  Cannot rule out noncompaction.  - Heartmate 3 LVAD placement in 9/19.  - Echo (5/22): EF 25-30%, mild LVH, aortic valve difficult to visualize but appears to open with each beat, mildly dilated RV with moderately decreased systolic function. - Echo (5/24): EF 20-25% with midline interventricular septum, RV moderately dilated with moderately decreased systolic function, IVC normal.  4. OSA:  CPAP use.  5. Prior smoker 6. Type 2 diabetes 7. Hyperlipidemia.  8. H/o NSVT 9. 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  10. RLE DVT: found in 9/19.  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 Subxiphoid abscess involving collection along driveline in 3/21, MSSA.  6/21 Rectus flap to cover abscess site.  12. Facial Zoster 3/21 13. 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   Current Outpatient Medications  Medication Sig Dispense Refill   acetaminophen (TYLENOL) 500 MG tablet Take 1,000 mg by mouth every 6 (six) hours as needed for mild pain.     amLODipine (NORVASC) 5 MG tablet Take 1 tablet (5 mg total) by mouth daily. 180 tablet 3   carbamide peroxide (DEBROX) 6.5 % OTIC solution Place 5 drops into both ears 3 (three) times daily. 15 mL 0   cephALEXin (KEFLEX) 500 MG capsule Take 1 capsule (500 mg total) by mouth 3 (three) times daily. 90 capsule 11   cyanocobalamin 500 MCG tablet Take 1,000 mcg by mouth daily.      docusate sodium (COLACE) 100 MG capsule Take 2 capsules (200 mg total) by mouth daily as needed for mild constipation. 10 capsule 0   fluticasone (FLONASE) 50 MCG/ACT nasal spray Place 1 spray into both nostrils 2 (two) times daily as needed for allergies. 9.9 mL 5   gabapentin (NEURONTIN) 300 MG capsule TAKE 2 CAPSULES BY MOUTH 4 TIMES DAILY 240 capsule 0   hydrALAZINE (APRESOLINE) 100 MG tablet Take 1 tablet (100 mg total) by mouth 3 (three) times daily. 90 tablet 6   hydrOXYzine (ATARAX) 50 MG tablet Take 1 tablet (50 mg total) by mouth 3 (three) times daily as needed for itching. 60 tablet 3   Ipratropium-Albuterol (COMBIVENT) 20-100 MCG/ACT AERS respimat Inhale 1 puff into the lungs every 6 (six) hours as needed for wheezing or shortness of breath. 4 g 5   nystatin cream (MYCOSTATIN) Apply 1 Application topically 3 (three) times daily. 30 g 0   pantoprazole (PROTONIX) 40 MG tablet Take 1 tablet (40  mg total) by mouth daily. 90 tablet 3   sacubitril-valsartan (ENTRESTO) 24-26 MG Take 1 tablet by mouth 2 (two) times daily. 60 tablet 3   sildenafil (VIAGRA) 100 MG tablet Take 0.5 tablets (50 mg total) by mouth daily as needed for erectile dysfunction. 10 tablet 6   thiamine 100 MG tablet Take 1 tablet (100 mg total) by mouth daily. 30 tablet 0   warfarin (COUMADIN) 5 MG tablet TAKE 1/2 (ONE-HALF) TABLET BY MOUTH ONCE DAILY EVERY TUESDAY AND 1 ONCE DAILY ON ALL OTHER DAYS OR AS INSTRUCTED BY HF CLINIC  30 tablet 6   Zinc Gluconate 100 MG TABS Take 1 tablet (100 mg total) by mouth daily. 90 tablet 3   zolpidem (AMBIEN) 5 MG tablet Take 1 tablet (5 mg total) by mouth at bedtime. 30 tablet 5   spironolactone (ALDACTONE) 25 MG tablet Take 1 tablet (25 mg total) by mouth daily. 45 tablet 3   No current facility-administered medications for this encounter.    Chlorhexidine and Other  REVIEW OF SYSTEMS: All systems negative except as listed in HPI, PMH and Problem list.   LVAD INTERROGATION:  Please see LVAD nurse's note above for details.   I reviewed the LVAD parameters from today, and compared the results to the patient's prior recorded data.  No programming changes were made.  The LVAD is functioning within specified parameters.  The patient performs LVAD self-test daily.  LVAD interrogation was negative for any significant power changes, alarms or PI events/speed drops.  LVAD equipment check completed and is in good working order.  Back-up equipment present.   LVAD education done on emergency procedures and precautions and reviewed exit site care.   MAP 107  Physical Exam: General: Well appearing this am. NAD.  HEENT: Normal. Neck: Supple, JVP 7-8 cm. Carotids OK.  Cardiac:  Mechanical heart sounds with LVAD hum present.  Lungs:  CTAB, normal effort.  Abdomen:  NT, ND, no HSM. No bruits or masses. +BS  LVAD exit site: Well-healed and incorporated. Dressing dry and intact. No erythema or  drainage. Stabilization device present and accurately applied. Driveline dressing changed daily per sterile technique. Extremities:  Warm and dry. No cyanosis, clubbing, rash, or edema.  Neuro:  Alert & oriented x 3. Cranial nerves grossly intact. Moves all 4 extremities w/o difficulty. Affect pleasant      ASSESSMENT AND PLAN:   1. 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  Echo today showed moderate RV dysfunction and midline interventricular septum, no speed change. LVAD parameters stable.  NYHA class I-II.   MAP still elevated, volume status looks ok on exam.  - BMET today.   - Increase spironolactone to 25 mg daily, BMET in 2 wks.  - Continue Entresto 24/26 bid, had AKI at higher dose.   - Continue hydralazine 100 mg tid.  - Stop Coreg with bradycardia and RV dysfunction by echo.  - Add amlodipine 5 mg daily for BP control.  - He is off ASA with GI bleeding.  - Continue warfarin for INR 1.8-2.3.    - Would be transplant candidate eventually if he can quit smoking.  Will make transplant clinic referral when he is off totally.  2. Smoking: Still smoking.  He tried Chantix and Wellbutrin but they did not work.   3. RLE DVT: On warfarin for LVAD.  4. OSA: Continue CPAP.   5. Type II diabetes: Per PCP.  6. 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  Site looks good today.   - He is on long-term Keflex for MSSA suppression.  Followup with ID.  8. 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 No overt bleeding.  Hgb stable today.  - Off ASA, INR goal now 1.8-2.3.  - Continue monthly octreotide injections.   Followup in 2 months    Marca Ancona 04/09/2023

## 2023-04-09 NOTE — Progress Notes (Signed)
Echocardiogram 2D Echocardiogram has been performed.  Theodore Welch 04/09/2023, 9:52 AM

## 2023-04-16 ENCOUNTER — Ambulatory Visit (HOSPITAL_COMMUNITY): Payer: Self-pay | Admitting: Pharmacist

## 2023-04-16 ENCOUNTER — Other Ambulatory Visit (HOSPITAL_COMMUNITY): Payer: Self-pay

## 2023-04-16 DIAGNOSIS — Z95811 Presence of heart assist device: Secondary | ICD-10-CM

## 2023-04-16 DIAGNOSIS — I5022 Chronic systolic (congestive) heart failure: Secondary | ICD-10-CM

## 2023-04-16 LAB — POCT INR: INR: 1.9 — AB (ref 2.0–3.0)

## 2023-04-18 ENCOUNTER — Other Ambulatory Visit: Payer: Self-pay

## 2023-04-18 ENCOUNTER — Encounter: Payer: Self-pay | Admitting: Infectious Diseases

## 2023-04-18 ENCOUNTER — Ambulatory Visit (INDEPENDENT_AMBULATORY_CARE_PROVIDER_SITE_OTHER): Payer: Medicare HMO | Admitting: Infectious Diseases

## 2023-04-18 VITALS — HR 78 | Temp 98.1°F | Resp 16 | Wt 205.0 lb

## 2023-04-18 DIAGNOSIS — T827XXA Infection and inflammatory reaction due to other cardiac and vascular devices, implants and grafts, initial encounter: Secondary | ICD-10-CM | POA: Diagnosis not present

## 2023-04-18 DIAGNOSIS — Z5181 Encounter for therapeutic drug level monitoring: Secondary | ICD-10-CM | POA: Diagnosis not present

## 2023-04-18 NOTE — Assessment & Plan Note (Signed)
Labs reviewed from other encounters with other providers including BMP and CBCs the last 3 months.  No side effects to antibiotics and no dose adjustments necessary.

## 2023-04-18 NOTE — Assessment & Plan Note (Signed)
Stable on suppressive keflex 500 mg TID. No changes in his treatment - site is stable and has only scant drainage. No pain/tenderness or swelling.   RTC in 70m for follow up on long term antibiotics. He is welcome to come once a year given close follow up with VAD team.

## 2023-04-18 NOTE — Patient Instructions (Signed)
So glad to see you both!  Please continue your keflex three times a day. Will plan to see you back in November.   All the labs that have been done look great -

## 2023-04-18 NOTE — Progress Notes (Signed)
Patient: Theodore Welch  DOB: 07/14/1961 MRN: [id number] PCP: [name], MD    Subjective:  CC: FU chronic LVAD MSSA infection (bacteremic several times, sternal abscess, DL exit site infections); s/p sternal rectus flap.  No concerns today - his wife, Theodore Welch joins him.     ID Hx: Theodore Welch is a 62 y.o. 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   January-2020: MSSA infection of counter incision to mid abdomen; s/p serial debridements. Initially did not involve exit site at this time. Received IV Cefazolin x 4 weeks --> PO Cephalexin x 4 weeks.  May-2020: recurrent MSSA infection, now involving driveline exit site and secondary bacteremia. TEE deferred due to low sensitivity for detecting VAD endocarditis. 6 weeks IV Cefazolin s/p abd wall debridement --> cephalexin for chronic suppression for duration pump is in place.   March-2021 sudden onset subxiphoid abscess involving proximal driveline, MSSA on cultures. Blood cultures negative. S/P I&D of abdominal wall exit site and subxiphoid space. IV cefazolin x 8 weeks (May 10th 2021).  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   Apr 16, 2020 - readmitted following re-opening and bleeding of subxiphoid wound. +MSSA and taken for debridement 6/01. Required repair of outflow graft at this surgery as well (?sharp puncture vs degenerated graft d/t infection). Vertical Rectus Flap repair performed with serial debridements. BC (-). PICC Cefazolin + Rifabutin planned through 7/7  April 27, 2020 - bleeding from sternum r/t subxiphoid abscess --> debridement in OR, Cx again grew out MSSA. Back on IV cefazolin for treatment.   March 2022 - enterobacter cloacae growing from non-healing soft tissue wound. Added 2 weeks cipro to treat --> Resolved. Has  continued on cephalexin.      HPI: Theodore Welch is here for 18m follow up on chronic LVAD driveline and device infection due to MSSA complicated by sternal abscess s/p flap repair. Gets his keflex in TID as prescribed. No side effects or trouble with getting his medication.    Review of Systems  Constitutional:  Negative for chills and fever.  HENT:  Negative for tinnitus.   Eyes:  Negative for blurred vision and photophobia.  Respiratory:  Negative for cough and sputum production.   Cardiovascular:  Negative for chest pain.  Gastrointestinal:  Negative for diarrhea, nausea and vomiting.  Genitourinary:  Negative for dysuria.  Skin:  Negative for rash.  Neurological:  Negative for headaches.      Past Medical History:  Diagnosis Date   Abscess 01/2020   sternal abscess   Cardiomyopathy, unspecified (HCC)    CHF (congestive heart failure) (HCC)    Chronic back pain    Diabetes mellitus without complication (HCC)    Enlarged heart    Gastric ulcer    Gastroenteritis    H/O degenerative disc disease    Hypertension    LVAD (left ventricular assist device) present [facility])     Outpatient Medications Prior to Visit  Medication Sig Dispense Refill   acetaminophen (TYLENOL) 500 MG tablet Take 1,000 mg by mouth every 6 (six) hours as needed for mild pain.     amLODipine (NORVASC) 5 MG tablet Take 1 tablet (5 mg total) by mouth daily. 180 tablet 3   carbamide peroxide (DEBROX) 6.5 % OTIC solution Place 5 drops into both ears 3 (three) times daily. 15 mL 0  Cephalexin 500 MG tablet Take 500 mg by mouth 3 (three) times daily.     cyanocobalamin 500 MCG tablet Take 1,000 mcg by mouth daily.      docusate sodium (COLACE) 100 MG capsule Take 2 capsules (200 mg total) by mouth daily as needed for mild constipation. 10 capsule 0   fluticasone (FLONASE) 50 MCG/ACT nasal spray Place 1 spray into both nostrils 2 (two) times daily as needed for allergies. 9.9 mL 5   gabapentin (NEURONTIN) 300 MG  capsule TAKE 2 CAPSULES BY MOUTH 4 TIMES DAILY 240 capsule 0   hydrALAZINE (APRESOLINE) 100 MG tablet Take 1 tablet (100 mg total) by mouth 3 (three) times daily. 90 tablet 6   hydrOXYzine (ATARAX) 50 MG tablet Take 1 tablet (50 mg total) by mouth 3 (three) times daily as needed for itching. 60 tablet 3   Ipratropium-Albuterol (COMBIVENT) 20-100 MCG/ACT AERS respimat Inhale 1 puff into the lungs every 6 (six) hours as needed for wheezing or shortness of breath. 4 g 5   nystatin cream (MYCOSTATIN) Apply 1 Application topically 3 (three) times daily. 30 g 0   pantoprazole (PROTONIX) 40 MG tablet Take 1 tablet (40 mg total) by mouth daily. 90 tablet 3   sacubitril-valsartan (ENTRESTO) 24-26 MG Take 1 tablet by mouth 2 (two) times daily. 60 tablet 3   sildenafil (VIAGRA) 100 MG tablet Take 0.5 tablets (50 mg total) by mouth daily as needed for erectile dysfunction. 10 tablet 6   spironolactone (ALDACTONE) 25 MG tablet Take 1 tablet (25 mg total) by mouth daily. 45 tablet 3   thiamine 100 MG tablet Take 1 tablet (100 mg total) by mouth daily. 30 tablet 0   warfarin (COUMADIN) 5 MG tablet TAKE 1/2 (ONE-HALF) TABLET BY MOUTH ONCE DAILY EVERY TUESDAY AND 1 ONCE DAILY ON ALL OTHER DAYS OR AS INSTRUCTED BY HF CLINIC 30 tablet 6   Zinc Gluconate 100 MG TABS Take 1 tablet (100 mg total) by mouth daily. 90 tablet 3   zolpidem (AMBIEN) 5 MG tablet Take 1 tablet (5 mg total) by mouth at bedtime. 30 tablet 5   No facility-administered medications prior to visit.     Allergies  Allergen Reactions   Chlorhexidine Rash    Blisters   Atorvastatin Other (See Comments)   Other Rash    Prep pads    Social History   Tobacco Use   Smoking status: Every Day    Packs/day: .25    Types: Cigarettes    Start date: 2003   Smokeless tobacco: Never   Tobacco comments:    slowed down  Vaping Use   Vaping Use: Never used  Substance Use Topics   Alcohol use: Not Currently   Drug use: Not Currently      Objective:   Vitals:   04/18/23 1030  Pulse: 78  Resp: 16  Temp: 98.1 F (36.7 C)  TempSrc: Oral  SpO2: 98%  Weight: 205 lb (93 kg)     Body mass index is 28.59 kg/m.   Physical Exam Vitals reviewed.  Constitutional:      Appearance: Normal appearance. He is not ill-appearing.  Eyes:     General: No scleral icterus.    Pupils: Pupils are equal, round, and reactive to light.  Cardiovascular:     Rate and Rhythm: Normal rate.     Comments: LVAD Humm +  Pulmonary:     Effort: Pulmonary effort is normal.  Abdominal:     Palpations: Abdomen is  soft.     Tenderness: There is no abdominal tenderness.     Comments: Driveline dressing clean and dry. Other abdominal incisions completely healed over and open to air.   Skin:    General: Skin is warm and dry.     Capillary Refill: Capillary refill takes less than 2 seconds.  Neurological:     Mental Status: He is alert and oriented to person, place, and time.      Lab Results: Lab Results  Component Value Date   WBC 8.4 04/09/2023   HGB 13.0 04/09/2023   HCT 38.5 (L) 04/09/2023   MCV 87.7 04/09/2023   PLT 204 04/09/2023    Lab Results  Component Value Date   CREATININE 0.97 04/09/2023   BUN 10 04/09/2023   NA 139 04/09/2023   K 3.9 04/09/2023   CL 108 04/09/2023   CO2 22 04/09/2023    Sed Rate (mm/hr)  Date Value  05/28/2021 4  03/27/2021 1  01/16/2021 1   CRP (mg/dL)  Date Value  16/08/9603 0.6  03/27/2021 0.7  01/16/2021 0.7     Assessment & Plan:   Problem List Items Addressed This Visit       High   Infection associated with driveline of left ventricular assist device (LVAD) (HCC) - Primary    Stable on suppressive keflex 500 mg TID. No changes in his treatment - site is stable and has only scant drainage. No pain/tenderness or swelling.   RTC in 32m for follow up on long term antibiotics. He is welcome to come once a year given close follow up with VAD team.        Relevant  Medications   Cephalexin 500 MG tablet     Unprioritized   Encounter for medication monitoring    Labs reviewed from other encounters with other providers including BMP and CBCs the last 3 months.  No side effects to antibiotics and no dose adjustments necessary.       Rexene Alberts, MSN, NP-C [facility]  [email or web address] Pager: [phone] Office: [phone] [facility] Main Line: [phone]

## 2023-04-22 ENCOUNTER — Ambulatory Visit (HOSPITAL_COMMUNITY): Payer: Self-pay | Admitting: Pharmacist

## 2023-04-22 ENCOUNTER — Ambulatory Visit: Payer: Medicare HMO | Admitting: Infectious Diseases

## 2023-04-22 LAB — POCT INR: INR: 2.1 (ref 2.0–3.0)

## 2023-04-23 ENCOUNTER — Ambulatory Visit (HOSPITAL_COMMUNITY)
Admission: RE | Admit: 2023-04-23 | Discharge: 2023-04-23 | Disposition: A | Discharge status: Home / Self Care | Payer: Medicare HMO | Source: Ambulatory Visit | Attending: Cardiology | Admitting: Cardiology

## 2023-04-23 DIAGNOSIS — I5022 Chronic systolic (congestive) heart failure: Secondary | ICD-10-CM | POA: Diagnosis not present

## 2023-04-23 DIAGNOSIS — Z95811 Presence of heart assist device: Secondary | ICD-10-CM | POA: Diagnosis not present

## 2023-04-23 LAB — BASIC METABOLIC PANEL
Anion gap: 14 (ref 5–15)
BUN: 13 mg/dL (ref 8–23)
CO2: 18 mmol/L — ABNORMAL LOW (ref 22–32)
Calcium: 9.2 mg/dL (ref 8.9–10.3)
Chloride: 103 mmol/L (ref 98–111)
Creatinine, Ser: 0.99 mg/dL (ref 0.61–1.24)
GFR, Estimated: >60 mL/min (ref >60)
Glucose, Bld: 112 mg/dL — ABNORMAL HIGH (ref 70–99)
Potassium: 3.7 mmol/L (ref 3.5–5.1)
Sodium: 135 mmol/L (ref 135–145)

## 2023-04-29 ENCOUNTER — Ambulatory Visit (HOSPITAL_COMMUNITY): Payer: Self-pay | Admitting: Pharmacist

## 2023-04-29 LAB — POCT INR: INR: 2 (ref 2.0–3.0)

## 2023-04-30 ENCOUNTER — Other Ambulatory Visit: Payer: Self-pay | Admitting: Infectious Diseases

## 2023-05-02 ENCOUNTER — Ambulatory Visit (HOSPITAL_COMMUNITY)
Admission: RE | Admit: 2023-05-02 | Discharge: 2023-05-02 | Disposition: A | Discharge status: Home / Self Care | Payer: Medicare HMO | Source: Ambulatory Visit | Attending: Cardiology | Admitting: Cardiology

## 2023-05-02 DIAGNOSIS — K31811 Angiodysplasia of stomach and duodenum with bleeding: Secondary | ICD-10-CM | POA: Diagnosis present

## 2023-05-02 DIAGNOSIS — Z95811 Presence of heart assist device: Secondary | ICD-10-CM | POA: Insufficient documentation

## 2023-05-02 MED ORDER — OCTREOTIDE ACETATE 20 MG IM KIT
20.0000 mg | PACK | INTRAMUSCULAR | Status: DC
  Administered 2023-05-02: 20 mg via INTRAMUSCULAR

## 2023-05-02 MED ORDER — OCTREOTIDE ACETATE 20 MG IM KIT
PACK | INTRAMUSCULAR | Status: AC
  Filled 2023-05-02: qty 1

## 2023-05-06 ENCOUNTER — Ambulatory Visit (HOSPITAL_COMMUNITY): Payer: Self-pay | Admitting: Pharmacist

## 2023-05-06 LAB — POCT INR: INR: 2.1 (ref 2.0–3.0)

## 2023-05-12 ENCOUNTER — Other Ambulatory Visit (HOSPITAL_COMMUNITY): Payer: Self-pay | Admitting: Cardiology

## 2023-05-12 DIAGNOSIS — I5022 Chronic systolic (congestive) heart failure: Secondary | ICD-10-CM

## 2023-05-12 DIAGNOSIS — Z95811 Presence of heart assist device: Secondary | ICD-10-CM

## 2023-05-12 DIAGNOSIS — M797 Fibromyalgia: Secondary | ICD-10-CM

## 2023-05-13 ENCOUNTER — Ambulatory Visit (HOSPITAL_COMMUNITY): Payer: Self-pay | Admitting: Pharmacist

## 2023-05-13 LAB — POCT INR: INR: 2.1 (ref 2.0–3.0)

## 2023-05-20 ENCOUNTER — Ambulatory Visit (HOSPITAL_COMMUNITY): Payer: Self-pay | Admitting: Pharmacist

## 2023-05-20 LAB — POCT INR: INR: 2.1 (ref 2.0–3.0)

## 2023-05-27 ENCOUNTER — Ambulatory Visit (HOSPITAL_COMMUNITY): Payer: Self-pay | Admitting: Pharmacist

## 2023-05-27 LAB — POCT INR: INR: 1.9 — AB (ref 2.0–3.0)

## 2023-05-30 ENCOUNTER — Ambulatory Visit (HOSPITAL_COMMUNITY)
Admission: RE | Admit: 2023-05-30 | Discharge: 2023-05-30 | Disposition: A | Discharge status: Home / Self Care | Payer: Medicare HMO | Source: Ambulatory Visit | Attending: Cardiology | Admitting: Cardiology

## 2023-05-30 DIAGNOSIS — Z95811 Presence of heart assist device: Secondary | ICD-10-CM | POA: Insufficient documentation

## 2023-05-30 DIAGNOSIS — K31811 Angiodysplasia of stomach and duodenum with bleeding: Secondary | ICD-10-CM | POA: Insufficient documentation

## 2023-05-30 MED ORDER — OCTREOTIDE ACETATE 20 MG IM KIT
20.0000 mg | PACK | INTRAMUSCULAR | Status: DC
  Administered 2023-05-30: 20 mg via INTRAMUSCULAR

## 2023-06-03 ENCOUNTER — Ambulatory Visit (HOSPITAL_COMMUNITY): Payer: Self-pay | Admitting: Pharmacist

## 2023-06-03 LAB — POCT INR: INR: 2.1 (ref 2.0–3.0)

## 2023-06-06 ENCOUNTER — Other Ambulatory Visit (HOSPITAL_COMMUNITY): Payer: Self-pay | Admitting: Cardiology

## 2023-06-09 ENCOUNTER — Other Ambulatory Visit (HOSPITAL_COMMUNITY): Payer: Self-pay | Admitting: *Deleted

## 2023-06-09 DIAGNOSIS — Z7901 Long term (current) use of anticoagulants: Secondary | ICD-10-CM

## 2023-06-09 DIAGNOSIS — I5022 Chronic systolic (congestive) heart failure: Secondary | ICD-10-CM

## 2023-06-09 DIAGNOSIS — Z95811 Presence of heart assist device: Secondary | ICD-10-CM

## 2023-06-10 ENCOUNTER — Ambulatory Visit (HOSPITAL_COMMUNITY): Payer: Self-pay | Admitting: Pharmacist

## 2023-06-10 LAB — POCT INR: INR: 1.9 — AB (ref 2.0–3.0)

## 2023-06-12 ENCOUNTER — Encounter (HOSPITAL_COMMUNITY): Payer: Self-pay | Admitting: Cardiology

## 2023-06-12 ENCOUNTER — Ambulatory Visit (HOSPITAL_COMMUNITY): Payer: Self-pay

## 2023-06-12 ENCOUNTER — Ambulatory Visit (HOSPITAL_COMMUNITY)
Admission: RE | Admit: 2023-06-12 | Discharge: 2023-06-12 | Disposition: A | Discharge status: Home / Self Care | Payer: Medicare HMO | Source: Ambulatory Visit | Attending: Cardiology | Admitting: Cardiology

## 2023-06-12 DIAGNOSIS — T827XXD Infection and inflammatory reaction due to other cardiac and vascular devices, implants and grafts, subsequent encounter: Secondary | ICD-10-CM | POA: Insufficient documentation

## 2023-06-12 DIAGNOSIS — I5022 Chronic systolic (congestive) heart failure: Secondary | ICD-10-CM

## 2023-06-12 DIAGNOSIS — K31811 Angiodysplasia of stomach and duodenum with bleeding: Secondary | ICD-10-CM | POA: Insufficient documentation

## 2023-06-12 DIAGNOSIS — B369 Superficial mycosis, unspecified: Secondary | ICD-10-CM

## 2023-06-12 DIAGNOSIS — G4733 Obstructive sleep apnea (adult) (pediatric): Secondary | ICD-10-CM | POA: Diagnosis not present

## 2023-06-12 DIAGNOSIS — I428 Other cardiomyopathies: Secondary | ICD-10-CM | POA: Diagnosis not present

## 2023-06-12 DIAGNOSIS — F17210 Nicotine dependence, cigarettes, uncomplicated: Secondary | ICD-10-CM | POA: Insufficient documentation

## 2023-06-12 DIAGNOSIS — E119 Type 2 diabetes mellitus without complications: Secondary | ICD-10-CM | POA: Diagnosis not present

## 2023-06-12 DIAGNOSIS — Z86718 Personal history of other venous thrombosis and embolism: Secondary | ICD-10-CM | POA: Insufficient documentation

## 2023-06-12 DIAGNOSIS — Z95811 Presence of heart assist device: Secondary | ICD-10-CM

## 2023-06-12 DIAGNOSIS — B9561 Methicillin susceptible Staphylococcus aureus infection as the cause of diseases classified elsewhere: Secondary | ICD-10-CM | POA: Insufficient documentation

## 2023-06-12 DIAGNOSIS — Z7901 Long term (current) use of anticoagulants: Secondary | ICD-10-CM

## 2023-06-12 LAB — BASIC METABOLIC PANEL
Anion gap: 14 (ref 5–15)
BUN: 13 mg/dL (ref 8–23)
CO2: 25 mmol/L (ref 22–32)
Calcium: 9.8 mg/dL (ref 8.9–10.3)
Chloride: 102 mmol/L (ref 98–111)
Creatinine, Ser: 1.12 mg/dL (ref 0.61–1.24)
GFR, Estimated: >60 mL/min (ref >60)
Glucose, Bld: 157 mg/dL — ABNORMAL HIGH (ref 70–99)
Potassium: 4.2 mmol/L (ref 3.5–5.1)
Sodium: 141 mmol/L (ref 135–145)

## 2023-06-12 LAB — CBC
HCT: 44 % (ref 39.0–52.0)
Hemoglobin: 14.3 g/dL (ref 13.0–17.0)
MCH: 28.5 pg (ref 26.0–34.0)
MCHC: 32.5 g/dL (ref 30.0–36.0)
MCV: 87.8 fL (ref 80.0–100.0)
Platelets: 211 10*3/uL (ref 150–400)
RBC: 5.01 MIL/uL (ref 4.22–5.81)
RDW: 13.2 % (ref 11.5–15.5)
WBC: 7.9 10*3/uL (ref 4.0–10.5)
nRBC: 0 % (ref 0.0–0.2)

## 2023-06-12 LAB — LACTATE DEHYDROGENASE: LDH: 186 U/L (ref 98–192)

## 2023-06-12 LAB — PROTIME-INR
INR: 1.4 — ABNORMAL HIGH (ref 0.8–1.2)
Prothrombin Time: 17.7 seconds — ABNORMAL HIGH (ref 11.4–15.2)

## 2023-06-12 MED ORDER — NYSTATIN 100000 UNIT/GM EX CREA: 1.0000 | TOPICAL_CREAM | Freq: Three times a day (TID) | CUTANEOUS | 5 refills | Status: DC

## 2023-06-12 NOTE — Patient Instructions (Signed)
No medication changes  Coumadin dosing per Lauren PharmD Return to clinic in 2 months. We will complete your 5 year Intermacs and annual maintenance at this visit. Please bring your back up bag, battery charger, and MPU for maintenance.

## 2023-06-12 NOTE — Progress Notes (Addendum)
Patient presents for 2 mo f/u in [facility] Clinic today alone. Reports no problems with VAD equipment or concerns with drive line.  Pt reports he has been feeling great. Denies lightheadedness, dizziness, falls, heart failure symptoms, and signs of bleeding. Pt endorses good appetite. Weight down 7 lbs. States he has modified his diet.   Confirmed pt started Amlodipine 5 mg daily and increased Spiro to 25 mg daily as prescribed last visit. BP improved today 100+ PI events noted on interrogation with increase in [place]. States he is drinking 2 L daily.   Pt continues to smoke and is not taking Wellbutrin or Chantix as prescribed for smoking cessation. Discussed again need for total documented cessation in order to proceed with possible transplant evaluation.  Seen at the [facility] yesterday for annual visit. C/o chronic back pain- [facility]. Had [facility] done yesterday.   [name] is changing his drive line dressing weekly using daily kit. Remains on Keflex TID per ID.   Receiving monthly Octreotide injections.   Vital Signs: Doppler Pressure: 80 Automatc BP: 95/65 (82) HR: 79 SR SPO2: 98% RA   Weight: 198.8 lbs w/o equipment Last weight: 205 lbs w/o equipment  VAD Indication: Destination Therapy due to smoking status   VAD interrogation & Equipment Management: Speed: 5800 Flow: 4.9 Power: 4.7 w    PI: 6.3 Hct: 38   Alarms: none Events: 100+ Fixed speed: 5800 Low speed limit: 5500  Primary Controller:  Replace back up battery in 11 months. Back up controller:   Replace back up battery in 26 months.    Annual Equipment Maintenance on UBC/PM was performed on 08/05/22.  I reviewed the LVAD parameters from today and compared the results to the patient's prior recorded data. LVAD interrogation was NEGATIVE for significant power changes, NEGATIVE for clinical alarms and NEGATIVE for PI events/speed drops. No programming changes were made and pump is functioning within specified parameters. Pt  is performing daily controller and system monitor self tests along with completing weekly and monthly maintenance for LVAD equipment.  LVAD equipment check completed and is in good working order. Back-up equipment present.   Exit Site Care: Drive line is being maintained weekly by [name]. CDI. Continue weekly dressing changes. Pt provided with 7 daily kits and adhesive remover for home use.   Device: N/A   BP & Labs:  Doppler 80- Doppler is reflecting MAP   Hgb 14.3- No S/S of bleeding. Specifically denies BRBPR.   LDH stable at 186 with established baseline of 150 - 220. Denies tea-colored urine. No power elevations noted on interrogation.    Patient Instructions: No medication changes  Coumadin dosing per Lauren PharmD Return to clinic in 2 months. We will complete your 5 year Intermacs and annual maintenance at this visit. Please bring your back up bag, battery charger, and MPU for maintenance.   Alyce Pagan RN VAD Coordinator  Office: [phone]  24/7 Pager: [phone]   62 y.o. with history of nonischemic cardiomyopathy, RLE DVT, cirrhosis, smoking, and OSA returns for followup of CHF/LVAD placement.  Cardiomyopathy was diagnosed in 6/19 in [place], [place] at that time showed low output.  He was admitted to [facility] in 9/19 with low output HF and was started on milrinone and diuresed.  Unable to wean off milrinone.  He had a degree of RV failure, but this improved on milrinone.  Valvular heart disease also looked better with milrinone and diuresis.  On 08/03/18, he had Heartmate 3 LVAD placed.  Speed was optimized  by ramp echo post-op.  Post-op course was relatively unremarkable.  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  No abscess on CT.  He was started on cefazolin IV for 6 wks.  BP-active meds decreased with low MAP.   He was admitted in 3/21 with subxiphoid abscess and cellulitis.  CT  showed collection along the course of the driveline.  There was no evidence for sternal osteomyelitis.  Abscess was debrided, MSSA grew from wound cultures.  Wound vac was placed.  Patient was started on cefazolin, which was recently stopped after completing 8 wks.   Patient was re-admitted later in 3/21 with facial Zoster.  He was treated with acyclovir and this has resolved.   He was admitted in 6/21 with bleeding from the site of the subxiphoid abscess as well as cellulitis.  He required multiple units of PRBCs.  He went to the OR initially for I&D.  Blood and wound cultures grew MSSA.  He then went back to the OR for rectus flap over wound site (plastics).  He was started on cefazolin and rifabutin, has PICC.   He was admitted in 7/21 with GI bleeding, he had 6 units PRBCs.  EGD showed duodenal AVMs, treated with APC.  INR goal with warfarin lowered to 1.8-2.3.   Echo in 5/22 was a difficult study with EF 25-30%, mild LVH, aortic valve difficult to visualize but appears to open with each beat, mildly dilated RV with moderately decreased systolic function.  Echo in 7/23 was a very difficult study.  Echo was done today and reviewed, EF 20-25% with midline interventricular septum, RV moderately dilated with moderately decreased systolic function, IVC normal.   He returns for LVAD followup.   Still smoking a few cigarettes/day.  Weight is down 7 lbs. MAP controlled. He has frequent PI events but no low flows. Main complaint is low back pain. Driveline looks ok. He is doing a lot of walking with no exertional dyspnea.   Denies LVAD alarms.  Reports taking Coumadin as prescribed and adherence to anticoagulation based dietary restrictions.  Denies bright red blood per rectum or melena, no dark urine or hematuria.    Labs (9/19): LDH 210, INR 2.17, WBCs 17.1 => 15, hgb 9.7, creatinine 0.72 Labs (10/19): K 4.3, creatinine 0.78 => 0.83, hgb 10.2 Labs (11/19): creatinine 0.79, hgb 10 Labs (2/20): K 3.9,  creatinine 0.79 Labs (4/20): K 4, creatinine 1.15, hgb 11.4, LDH 199, INR 1.9 Labs (5/20): hgb 10.4, WBCs 8.5 Labs (6/20): K 4.2, creatinine 1.06, hgb 11.2 Labs (8/20): k 3.7, creatinine 1.0, hgb 11.5, LDH 186 Labs (10/20): K 3.5, creatinine 0.93, INR 2, LDH 221 Labs (12/20): hgb 15.6, K 3.7, creatinine 1.02, LDH 250 Labs (4/21): K 4.1, creatinine 0.86, hgb 11.1 Labs (6/21): K 4.1, creatinine 0.76, hgb 10, WBCs 11.2 Labs (8/21): hgb 10.6 Labs (10/21): hgb 13.2, creatinine 1.17 Labs (7/22): hgb 8, LDH 372, INR 1.9, creatinine 0.95 Labs (8/22): hgb 12.6 Labs (9/22): K 3.9, creatinine 1.1 Labs (11/22): creatinine 1.01 Labs (5/23): K 4.4, creatinine 0.72, hgb 13.3, LDH 214 Labs (7/23): K 3.8, creatinine 0.96 Labs (11/23): K 3.9, creatinine 1.02 Labs (3/24): LDL 219, hgb 13.4, K 5.4, creatinine 1.54 => 1.43 Labs (5/24): hgb 13 Labs (6/24): K 3.7, creatinine 0.99  PMH: 1. Degenerative disc disease.  2. GERD 3. Chronic systolic CHF:  Nonischemic cardiomyopathy.  Dilated cardiomyopathy diagnosed 6/19 in [place].  LHC/RHC  in 7/19 showed elevated filling pressures, low cardiac output, and no significant CAD.   - Echo (9/19): Severe LV dilation with EF 10-20%, moderate-severe MR, severely dilated RV with mildly decreased systolic function, severe TR.  - Cardiac MRI (9/19): EF 14%, moderate dilated LV, severely dilated RV with mod-severe systolic function and EF 25%, nonspecific RV insertion site LGE.  - RHC (5/19) on milrinone 0.375: mean RA 8, PA 40/19, mean PCWP 12, PAPi 2.65, CI 2.71.  - Echo (9/19) on milrinone and diuresed): EF 20-25% with moderate LV dilation, moderately dilated RV with mildly decreased systolic function, mild-moderate MR, moderate TR, PASP 51 mmHg.  Cannot rule out noncompaction.  - Heartmate 3 LVAD placement in 9/19.  - Echo (5/22): EF 25-30%, mild LVH, aortic valve difficult to visualize but appears to open with each beat, mildly dilated RV with moderately decreased  systolic function. - Echo (5/24): EF 20-25% with midline interventricular septum, RV moderately dilated with moderately decreased systolic function, IVC normal.  4. OSA: CPAP use.  5. Prior smoker 6. Type 2 diabetes 7. Hyperlipidemia.  8. H/o NSVT 9. 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  10. RLE DVT: found in 9/19.  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 Subxiphoid abscess involving collection along driveline in 3/21, MSSA.  6/21 Rectus flap to cover abscess site.  12. Facial Zoster 3/21 13. 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   Current Outpatient Medications  Medication Sig Dispense Refill   acetaminophen (TYLENOL) 500 MG tablet Take 1,000 mg by mouth every 6 (six) hours as needed for mild pain.     amLODipine (NORVASC) 5 MG tablet Take 1 tablet (5 mg total) by mouth daily. 180 tablet 3   carbamide peroxide (DEBROX) 6.5 % OTIC solution Place 5 drops into both ears 3 (three) times daily. 15 mL 0   cephALEXin (KEFLEX) 500 MG capsule TAKE 1 CAPSULE BY MOUTH THREE TIMES DAILY 90 capsule 5   cyanocobalamin 500 MCG tablet Take 1,000 mcg by mouth daily.      docusate sodium (COLACE) 100 MG capsule Take 2 capsules (200 mg total) by mouth daily as needed for mild constipation. 10 capsule 0   fluticasone (FLONASE) 50 MCG/ACT nasal spray Place 1 spray into both nostrils 2 (two) times daily as needed for allergies. 9.9 mL 5   gabapentin (NEURONTIN) 300 MG capsule TAKE 2 CAPSULES BY MOUTH 4 TIMES DAILY 240 capsule 12   hydrALAZINE (APRESOLINE) 100 MG tablet Take 1 tablet (100 mg total) by mouth 3 (three) times daily. 90 tablet 6   hydrOXYzine (ATARAX) 50 MG tablet Take 1 tablet (50 mg total) by mouth 3 (three) times daily as needed for itching. 60 tablet 3   Ipratropium-Albuterol (COMBIVENT) 20-100 MCG/ACT AERS respimat Inhale 1 puff into the lungs every 6 (six) hours as needed for wheezing or shortness of breath. 4 g  5   pantoprazole (PROTONIX) 40 MG tablet Take 1 tablet (40 mg total) by mouth daily. 90 tablet 3   sacubitril-valsartan (ENTRESTO) 24-26 MG Take 1 tablet by mouth twice daily 60 tablet 11   sildenafil (VIAGRA) 100 MG tablet Take 0.5 tablets (50 mg total) by mouth daily as needed for erectile dysfunction. 10 tablet 6   spironolactone (ALDACTONE) 25 MG tablet Take 1 tablet (25 mg total) by mouth daily. 45 tablet 3   thiamine 100 MG tablet Take 1 tablet (100 mg total) by mouth daily. 30 tablet 0  warfarin (COUMADIN) 5 MG tablet TAKE 0.5 (ONE-HALF) TABLET BY MOUTH EVERY TUESDAY AND ONE TABLET ONCE DAILY ON ALL OTHER DAYS OR AS INSTRUCTED BY HF CLINIC 30 tablet 8   Zinc Gluconate 100 MG TABS Take 1 tablet (100 mg total) by mouth daily. 90 tablet 3   zolpidem (AMBIEN) 5 MG tablet Take 1 tablet (5 mg total) by mouth at bedtime. 30 tablet 5   Cephalexin 500 MG tablet Take 500 mg by mouth 3 (three) times daily.     nystatin cream (MYCOSTATIN) Apply 1 Application topically 3 (three) times daily. 30 g 5   No current facility-administered medications for this encounter.    Chlorhexidine, Atorvastatin, and Other  REVIEW OF SYSTEMS: All systems negative except as listed in HPI, PMH and Problem list.   LVAD INTERROGATION:  Please see LVAD nurse's note above for details.   I reviewed the LVAD parameters from today, and compared the results to the patient's prior recorded data.  No programming changes were made.  The LVAD is functioning within specified parameters.  The patient performs LVAD self-test daily.  LVAD interrogation was negative for any significant power changes, alarms or PI events/speed drops.  LVAD equipment check completed and is in good working order.  Back-up equipment present.   LVAD education done on emergency procedures and precautions and reviewed exit site care.   MAP 82  Physical Exam: General: Well appearing this am. NAD.  HEENT: Normal. Neck: Supple, JVP 7-8 cm. Carotids OK.   Cardiac:  Mechanical heart sounds with LVAD hum present.  Lungs:  CTAB, normal effort.  Abdomen:  NT, ND, no HSM. No bruits or masses. +BS  LVAD exit site: Well-healed and incorporated. Dressing dry and intact. No erythema or drainage. Stabilization device present and accurately applied. Driveline dressing changed daily per sterile technique. Extremities:  Warm and dry. No cyanosis, clubbing, rash, or edema.  Neuro:  Alert & oriented x 3. Cranial nerves grossly intact. Moves all 4 extremities w/o difficulty. Affect pleasant      ASSESSMENT AND PLAN:   1. 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  Echo today showed moderate RV dysfunction and midline interventricular septum, no speed change. LVAD parameters stable.  NYHA class I.   MAP controlled, volume status looks ok on exam.  - BMET today.   - Continue spironolactone 25 mg daily.  - Continue Entresto 24/26 bid, had AKI at higher dose.   - Continue hydralazine 100 mg tid.  - Off Coreg with bradycardia and RV dysfunction by echo.  - Continue amlodipine 5 mg daily for BP control.  - He is off ASA with GI bleeding.  - Continue warfarin for INR 1.8-2.3.    - Would be transplant candidate eventually if he can quit smoking.  Will make transplant clinic referral when he is off totally.  2. Smoking: Still smoking.  He tried Chantix and Wellbutrin but they did not work.   3. RLE DVT: On warfarin for LVAD.  4. OSA: Continue CPAP.   5. Type II diabetes: Per PCP.  6. 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  Site looks good today.   - He is on long-term Keflex for MSSA suppression.  Followup with ID.  8. 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 No overt bleeding. - Check Hgb today.  - Off ASA, INR goal now 1.8-2.3.  - Continue monthly octreotide injections.  Followup in 2 months    Marca Ancona 06/12/2023

## 2023-06-18 ENCOUNTER — Ambulatory Visit (HOSPITAL_COMMUNITY): Payer: Self-pay | Admitting: Pharmacist

## 2023-06-18 LAB — POCT INR: INR: 2.1 (ref 2.0–3.0)

## 2023-06-24 ENCOUNTER — Ambulatory Visit (HOSPITAL_COMMUNITY): Payer: Self-pay | Admitting: Pharmacist

## 2023-06-24 DIAGNOSIS — Z7901 Long term (current) use of anticoagulants: Secondary | ICD-10-CM | POA: Diagnosis not present

## 2023-06-24 LAB — POCT INR: INR: 2.1 (ref 2.0–3.0)

## 2023-06-27 ENCOUNTER — Ambulatory Visit (HOSPITAL_COMMUNITY)
Admission: RE | Admit: 2023-06-27 | Discharge: 2023-06-27 | Disposition: A | Discharge status: Home / Self Care | Payer: Medicare HMO | Source: Ambulatory Visit | Attending: Cardiology | Admitting: Cardiology

## 2023-06-27 DIAGNOSIS — K31811 Angiodysplasia of stomach and duodenum with bleeding: Secondary | ICD-10-CM | POA: Diagnosis not present

## 2023-06-27 DIAGNOSIS — Z95811 Presence of heart assist device: Secondary | ICD-10-CM | POA: Insufficient documentation

## 2023-06-27 MED ORDER — OCTREOTIDE ACETATE 20 MG IM KIT
PACK | INTRAMUSCULAR | Status: AC
  Administered 2023-06-27: 20 mg via INTRAMUSCULAR
  Filled 2023-06-27: qty 1

## 2023-06-27 MED ORDER — OCTREOTIDE ACETATE 20 MG IM KIT
20.0000 mg | PACK | INTRAMUSCULAR | Status: DC
Start: 2023-06-27 — End: 2023-06-27

## 2023-06-27 MED ORDER — OCTREOTIDE ACETATE 20 MG IM KIT: 20.0000 mg | PACK | INTRAMUSCULAR | Status: DC

## 2023-07-02 ENCOUNTER — Ambulatory Visit (HOSPITAL_COMMUNITY): Payer: Self-pay | Admitting: Pharmacist

## 2023-07-02 LAB — POCT INR: INR: 1.9 — AB (ref 2.0–3.0)

## 2023-07-06 ENCOUNTER — Other Ambulatory Visit (HOSPITAL_COMMUNITY): Payer: Self-pay | Admitting: Cardiology

## 2023-07-06 DIAGNOSIS — G4709 Other insomnia: Secondary | ICD-10-CM

## 2023-07-07 ENCOUNTER — Other Ambulatory Visit (HOSPITAL_COMMUNITY): Payer: Self-pay | Admitting: Unknown Physician Specialty

## 2023-07-07 DIAGNOSIS — G4709 Other insomnia: Secondary | ICD-10-CM

## 2023-07-07 MED ORDER — ZOLPIDEM TARTRATE 5 MG PO TABS: 5.0000 mg | ORAL_TABLET | Freq: Every evening | ORAL | 5 refills | Status: DC | PRN

## 2023-07-08 ENCOUNTER — Ambulatory Visit (HOSPITAL_COMMUNITY): Payer: Self-pay | Admitting: Pharmacist

## 2023-07-08 LAB — POCT INR: INR: 2.1 (ref 2.0–3.0)

## 2023-07-16 ENCOUNTER — Ambulatory Visit (HOSPITAL_COMMUNITY): Payer: Self-pay | Admitting: Pharmacist

## 2023-07-16 LAB — POCT INR: INR: 2 (ref 2.0–3.0)

## 2023-07-18 ENCOUNTER — Other Ambulatory Visit (HOSPITAL_COMMUNITY): Payer: Self-pay | Admitting: *Deleted

## 2023-07-22 ENCOUNTER — Ambulatory Visit (HOSPITAL_COMMUNITY): Payer: Self-pay | Admitting: Pharmacist

## 2023-07-22 LAB — POCT INR: INR: 2.1 (ref 2.0–3.0)

## 2023-07-25 ENCOUNTER — Ambulatory Visit (HOSPITAL_COMMUNITY)
Admission: RE | Admit: 2023-07-25 | Discharge: 2023-07-25 | Disposition: A | Discharge status: Home / Self Care | Payer: Medicare HMO | Source: Ambulatory Visit | Attending: Cardiology

## 2023-07-25 DIAGNOSIS — K31811 Angiodysplasia of stomach and duodenum with bleeding: Secondary | ICD-10-CM | POA: Insufficient documentation

## 2023-07-25 DIAGNOSIS — Z95811 Presence of heart assist device: Secondary | ICD-10-CM | POA: Insufficient documentation

## 2023-07-25 MED ORDER — OCTREOTIDE ACETATE 20 MG IM KIT: 20.0000 mg | PACK | INTRAMUSCULAR | Status: DC

## 2023-07-25 MED ORDER — OCTREOTIDE ACETATE 20 MG IM KIT
PACK | INTRAMUSCULAR | Status: AC
  Administered 2023-07-25: 20 mg via INTRAMUSCULAR
  Filled 2023-07-25: qty 1

## 2023-07-29 ENCOUNTER — Ambulatory Visit (HOSPITAL_COMMUNITY): Payer: Self-pay | Admitting: Pharmacist

## 2023-07-29 DIAGNOSIS — Z7901 Long term (current) use of anticoagulants: Secondary | ICD-10-CM | POA: Diagnosis not present

## 2023-07-29 LAB — POCT INR: INR: 1.9 — AB (ref 2.0–3.0)

## 2023-08-05 ENCOUNTER — Ambulatory Visit (HOSPITAL_COMMUNITY): Payer: Self-pay | Admitting: Pharmacist

## 2023-08-05 LAB — POCT INR: INR: 2.1 (ref 2.0–3.0)

## 2023-08-06 ENCOUNTER — Other Ambulatory Visit (HOSPITAL_COMMUNITY): Payer: Self-pay | Admitting: *Deleted

## 2023-08-06 DIAGNOSIS — Z95811 Presence of heart assist device: Secondary | ICD-10-CM

## 2023-08-06 DIAGNOSIS — Z7901 Long term (current) use of anticoagulants: Secondary | ICD-10-CM

## 2023-08-06 DIAGNOSIS — I5022 Chronic systolic (congestive) heart failure: Secondary | ICD-10-CM

## 2023-08-11 ENCOUNTER — Ambulatory Visit (HOSPITAL_COMMUNITY)
Admission: RE | Admit: 2023-08-11 | Discharge: 2023-08-11 | Disposition: A | Discharge status: Home / Self Care | Payer: Medicare HMO | Source: Ambulatory Visit | Attending: Cardiology

## 2023-08-11 ENCOUNTER — Ambulatory Visit (HOSPITAL_COMMUNITY): Payer: Self-pay | Admitting: Pharmacist

## 2023-08-11 DIAGNOSIS — L299 Pruritus, unspecified: Secondary | ICD-10-CM

## 2023-08-11 DIAGNOSIS — E119 Type 2 diabetes mellitus without complications: Secondary | ICD-10-CM | POA: Diagnosis not present

## 2023-08-11 DIAGNOSIS — Z4509 Encounter for adjustment and management of other cardiac device: Secondary | ICD-10-CM | POA: Diagnosis not present

## 2023-08-11 DIAGNOSIS — Z95811 Presence of heart assist device: Secondary | ICD-10-CM | POA: Diagnosis not present

## 2023-08-11 DIAGNOSIS — F17210 Nicotine dependence, cigarettes, uncomplicated: Secondary | ICD-10-CM | POA: Insufficient documentation

## 2023-08-11 DIAGNOSIS — G4733 Obstructive sleep apnea (adult) (pediatric): Secondary | ICD-10-CM | POA: Diagnosis not present

## 2023-08-11 DIAGNOSIS — B9561 Methicillin susceptible Staphylococcus aureus infection as the cause of diseases classified elsewhere: Secondary | ICD-10-CM | POA: Insufficient documentation

## 2023-08-11 DIAGNOSIS — Z7901 Long term (current) use of anticoagulants: Secondary | ICD-10-CM | POA: Insufficient documentation

## 2023-08-11 DIAGNOSIS — I5022 Chronic systolic (congestive) heart failure: Secondary | ICD-10-CM | POA: Insufficient documentation

## 2023-08-11 DIAGNOSIS — Z86718 Personal history of other venous thrombosis and embolism: Secondary | ICD-10-CM | POA: Diagnosis not present

## 2023-08-11 DIAGNOSIS — I10 Essential (primary) hypertension: Secondary | ICD-10-CM

## 2023-08-11 DIAGNOSIS — I428 Other cardiomyopathies: Secondary | ICD-10-CM | POA: Insufficient documentation

## 2023-08-11 DIAGNOSIS — T827XXA Infection and inflammatory reaction due to other cardiac and vascular devices, implants and grafts, initial encounter: Secondary | ICD-10-CM | POA: Diagnosis not present

## 2023-08-11 DIAGNOSIS — I159 Secondary hypertension, unspecified: Secondary | ICD-10-CM | POA: Diagnosis not present

## 2023-08-11 LAB — COMPREHENSIVE METABOLIC PANEL
ALT: 23 U/L (ref 0–44)
AST: 22 U/L (ref 15–41)
Albumin: 4.2 g/dL (ref 3.5–5.0)
Alkaline Phosphatase: 97 U/L (ref 38–126)
Anion gap: 12 (ref 5–15)
BUN: 24 mg/dL — ABNORMAL HIGH (ref 8–23)
CO2: 19 mmol/L — ABNORMAL LOW (ref 22–32)
Calcium: 9.7 mg/dL (ref 8.9–10.3)
Chloride: 106 mmol/L (ref 98–111)
Creatinine, Ser: 1.51 mg/dL — ABNORMAL HIGH (ref 0.61–1.24)
GFR, Estimated: 51 mL/min — ABNORMAL LOW (ref >60)
Glucose, Bld: 113 mg/dL — ABNORMAL HIGH (ref 70–99)
Potassium: 4.7 mmol/L (ref 3.5–5.1)
Sodium: 137 mmol/L (ref 135–145)
Total Bilirubin: 0.7 mg/dL (ref 0.3–1.2)
Total Protein: 7.3 g/dL (ref 6.5–8.1)

## 2023-08-11 LAB — CBC
HCT: 42.2 % (ref 39.0–52.0)
Hemoglobin: 13.7 g/dL (ref 13.0–17.0)
MCH: 27.7 pg (ref 26.0–34.0)
MCHC: 32.5 g/dL (ref 30.0–36.0)
MCV: 85.3 fL (ref 80.0–100.0)
Platelets: 208 10*3/uL (ref 150–400)
RBC: 4.95 MIL/uL (ref 4.22–5.81)
RDW: 14.6 % (ref 11.5–15.5)
WBC: 9.2 10*3/uL (ref 4.0–10.5)
nRBC: 0 % (ref 0.0–0.2)

## 2023-08-11 LAB — PROTIME-INR
INR: 1.9 — ABNORMAL HIGH (ref 0.8–1.2)
Prothrombin Time: 21.7 seconds — ABNORMAL HIGH (ref 11.4–15.2)

## 2023-08-11 LAB — LACTATE DEHYDROGENASE: LDH: 218 U/L — ABNORMAL HIGH (ref 98–192)

## 2023-08-11 LAB — PREALBUMIN: Prealbumin: 34 mg/dL (ref 18–38)

## 2023-08-11 MED ORDER — HYDROXYZINE HCL 50 MG PO TABS: 50.0000 mg | ORAL_TABLET | Freq: Three times a day (TID) | ORAL | 3 refills | Status: DC | PRN

## 2023-08-11 MED ORDER — ENTRESTO 49-51 MG PO TABS
1.0000 | ORAL_TABLET | Freq: Two times a day (BID) | ORAL | 3 refills | Status: DC
Start: 2023-08-11 — End: 2024-06-18

## 2023-08-11 MED ORDER — HYDRALAZINE HCL 100 MG PO TABS
100.0000 mg | ORAL_TABLET | Freq: Three times a day (TID) | ORAL | 3 refills | Status: DC
Start: 2023-08-11 — End: 2024-08-16

## 2023-08-11 MED ORDER — SPIRONOLACTONE 25 MG PO TABS: 25.0000 mg | ORAL_TABLET | Freq: Every day | ORAL | 3 refills | Status: DC

## 2023-08-11 NOTE — Patient Instructions (Signed)
Increase Entresto 49/51 mg twice daily.  Labs today - will call you if abnormal. Repeat lab on 08/22/23 at 9:30. Will schedule your 2 month follow up and will be available on MyChart. Please call us if any questions or concerns prior to your next visit.

## 2023-08-11 NOTE — Progress Notes (Signed)
6 Min Walk Test Completed  Pt ambulated 1475ft (441.62m) O2 Sat ranged 95-99 on None L oxygen HR ranged 72-80

## 2023-08-11 NOTE — Progress Notes (Addendum)
Patient presents for 2 mo f/u with 5 year Intermacs and Annual Maintenancein [facility] today with wife. Reports no problems with VAD equipment or concerns with drive line.  Pt reports he has been feeling good.  Denies lightheadedness, dizziness, falls, heart failure symptoms, and signs of bleeding. Pt endorses good appetite.   Pt's BP elevated today, Dr. Shirlee Latch increased [name] to 49/51 mg twice daily. Repeat BMP scheduled and ordered for 08/22/23 (pt will be here that day for Octreotide).  Theodore Welch is changing his drive line dressing weekly using daily kit. Remains on Keflex TID per ID.   Receiving monthly Octreotide injections.   Vital Signs: Doppler Pressure: 104 Automatic BP: 115/88 (98) HR: 77 SR SPO2: 98% RA   Weight: 195.2 lbs w/o equipment Last weight: 198.8 lbs w/o equipment  VAD Indication: Destination Therapy due to smoking status   VAD interrogation & Equipment Management: Speed: 5800 Flow: 4.8 Power: 4.6 w    PI: 6.6 Hct: 38   Alarms: none Events: > 80 today Fixed speed: 5800 Low speed limit: 5500  Primary Controller:  Replace back up battery in 9 months. Back up controller:   Replace back up battery in 18 months.    Annual Equipment Maintenance on UBC/PM was performed on 08/11/23.  I reviewed the LVAD parameters from today and compared the results to the patient's prior recorded data. LVAD interrogation was NEGATIVE for significant power changes, NEGATIVE for clinical alarms and NEGATIVE for PI events/speed drops. No programming changes were made and pump is functioning within specified parameters. Pt is performing daily controller and system monitor self tests along with completing weekly and monthly maintenance for LVAD equipment.  LVAD equipment check completed and is in good working order. Back-up equipment present.   Exit Site Care: Drive line is being maintained weekly by [name]. She asked that I change dressing today. Existing VAD dressing removed and  site care performed using sterile technique. Drive line exit site cleaned with Vashe cleanser, allowed to dry, and gauze dressing applied. Exit site healed and incorporated, the velour is fully implanted at exit site. No redness, tenderness, drainage, foul odor or rash noted. Drive line anchor re-applied. Pt denies fever or chills. Continue weekly dressing changes. Provided patient with 7 daily dressing kits, 8 anchors, alcohol pads, adhesive remover pads, and extra sterile 2 x 2 gauze.   Device: N/A   BP & Labs:  Doppler 104 - Doppler is reflecting MAP   Hgb 13.7 -  No S/S of bleeding. Specifically denies BRBPR.   LDH stable at 218 with established baseline of 150 - 220. Denies tea-colored urine. No power elevations noted on interrogation.   5 year Intermacs follow up completed including:  Quality of Life, KCCQ-12, and Neurocognitive trail making.   Pt completed 1450 feet during 6 minute walk.  [place] Cardiomyopathy Questionnaire     08/11/2023    1:46 PM 02/05/2023   10:53 AM 08/05/2022    1:40 PM  KCCQ-12  1 a. Ability to shower/bathe Slightly limited Not at all limited Moderately limited  1 b. Ability to walk 1 block Slightly limited Not at all limited Slightly limited  1 c. Ability to hurry/jog Extremely limited Extremely limited Extremely limited  2. Edema feet/ankles/legs Never over the past 2 weeks Never over the past 2 weeks Never over the past 2 weeks  3. Limited by fatigue Less than once a week Less than once a week Less than once a week  4. Limited by dyspnea Less  than once a week Less than once a week Less than once a week  5. Sitting up / on 3+ pillows Never over the past 2 weeks Never over the past 2 weeks Never over the past 2 weeks  6. Limited enjoyment of life Slightly limited Slightly limited Moderately limited  7. Rest of life w/ symptoms Mostly satisfied Mostly satisfied Mostly satisfied  8 a. Participation in hobbies Severely limited Slightly limited Limited  quite a bit  8 b. Participation in chores Moderately limited Slightly limited Slightly limited  8 c. Visiting family/friends Slightly limited Did not limit at all Slightly limited      Back up controller: 11V backup battery fully charged by patient.   Patient Goals: Continue caring for grandchildren as needed.    Patient Instructions: Increase Entresto 49/51 mg twice daily.  Labs today - will call you if abnormal. Repeat lab on 08/22/23 at 9:30. Will schedule your 2 month follow up and will be available on MyChart. Please call us if any questions or concerns prior to your next visit.    Theodore Diener RN VAD Coordinator  Office: [phone]  24/7 Pager: [phone]   62 y.o. with history of nonischemic cardiomyopathy, RLE DVT, cirrhosis, smoking, and OSA returns for followup of CHF/LVAD placement.  Cardiomyopathy was diagnosed in 6/19 in [place], [place] at that time showed low output.  He was admitted to [facility] in 9/19 with low output HF and was started on milrinone and diuresed.  Unable to wean off milrinone.  He had a degree of RV failure, but this improved on milrinone.  Valvular heart disease also looked better with milrinone and diuresis.  On 08/03/18, he had Heartmate 3 LVAD placed.  Speed was optimized by ramp echo post-op.  Post-op course was relatively unremarkable.  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  No abscess on CT.  He was started on cefazolin IV for 6 wks.  BP-active meds decreased with low MAP.   He was admitted in 3/21 with subxiphoid abscess and cellulitis.  CT showed collection along the course of the driveline.  There was no evidence for sternal osteomyelitis.  Abscess was debrided, MSSA grew from wound cultures.  Wound vac was placed.  Patient was started on cefazolin, which was recently stopped after completing 8 wks.   Patient was re-admitted later in 3/21 with facial Zoster.   He was treated with acyclovir and this has resolved.   He was admitted in 6/21 with bleeding from the site of the subxiphoid abscess as well as cellulitis.  He required multiple units of PRBCs.  He went to the OR initially for I&D.  Blood and wound cultures grew MSSA.  He then went back to the OR for rectus flap over wound site (plastics).  He was started on cefazolin and rifabutin, has PICC.   He was admitted in 7/21 with GI bleeding, he had 6 units PRBCs.  EGD showed duodenal AVMs, treated with APC.  INR goal with warfarin lowered to 1.8-2.3.   Echo in 5/22 was a difficult study with EF 25-30%, mild LVH, aortic valve difficult to visualize but appears to open with each beat, mildly dilated RV with moderately decreased systolic function.  Echo in 7/23 was a very difficult study.  Echo was done today and reviewed, EF 20-25% with midline interventricular septum, RV moderately dilated with moderately decreased systolic function, IVC normal.   He returns  for LVAD followup.   Still smoking a few cigarettes/day.  Weight is stable. No significant exertional dyspnea, no real limitations.  No lightheadedness.  MAP is mildly elevated today.    Denies LVAD alarms.  Reports taking Coumadin as prescribed and adherence to anticoagulation based dietary restrictions.  Denies bright red blood per rectum or melena, no dark urine or hematuria.  Gets monthly octreotide.   Labs (9/19): LDH 210, INR 2.17, WBCs 17.1 => 15, hgb 9.7, creatinine 0.72 Labs (10/19): K 4.3, creatinine 0.78 => 0.83, hgb 10.2 Labs (11/19): creatinine 0.79, hgb 10 Labs (2/20): K 3.9, creatinine 0.79 Labs (4/20): K 4, creatinine 1.15, hgb 11.4, LDH 199, INR 1.9 Labs (5/20): hgb 10.4, WBCs 8.5 Labs (6/20): K 4.2, creatinine 1.06, hgb 11.2 Labs (8/20): k 3.7, creatinine 1.0, hgb 11.5, LDH 186 Labs (10/20): K 3.5, creatinine 0.93, INR 2, LDH 221 Labs (12/20): hgb 15.6, K 3.7, creatinine 1.02, LDH 250 Labs (4/21): K 4.1, creatinine 0.86, hgb  11.1 Labs (6/21): K 4.1, creatinine 0.76, hgb 10, WBCs 11.2 Labs (8/21): hgb 10.6 Labs (10/21): hgb 13.2, creatinine 1.17 Labs (7/22): hgb 8, LDH 372, INR 1.9, creatinine 0.95 Labs (8/22): hgb 12.6 Labs (9/22): K 3.9, creatinine 1.1 Labs (11/22): creatinine 1.01 Labs (5/23): K 4.4, creatinine 0.72, hgb 13.3, LDH 214 Labs (7/23): K 3.8, creatinine 0.96 Labs (11/23): K 3.9, creatinine 1.02 Labs (3/24): LDL 219, hgb 13.4, K 5.4, creatinine 1.54 => 1.43 Labs (5/24): hgb 13 Labs (6/24): K 3.7, creatinine 0.99  PMH: 1. Degenerative disc disease.  2. GERD 3. Chronic systolic CHF:  Nonischemic cardiomyopathy.  Dilated cardiomyopathy diagnosed 6/19 in [place].  LHC/RHC in 7/19 showed elevated filling pressures, low cardiac output, and no significant CAD.   - Echo (9/19): Severe LV dilation with EF 10-20%, moderate-severe MR, severely dilated RV with mildly decreased systolic function, severe TR.  - Cardiac MRI (9/19): EF 14%, moderate dilated LV, severely dilated RV with mod-severe systolic function and EF 25%, nonspecific RV insertion site LGE.  - RHC (5/19) on milrinone 0.375: mean RA 8, PA 40/19, mean PCWP 12, PAPi 2.65, CI 2.71.  - Echo (9/19) on milrinone and diuresed): EF 20-25% with moderate LV dilation, moderately dilated RV with mildly decreased systolic function, mild-moderate MR, moderate TR, PASP 51 mmHg.  Cannot rule out noncompaction.  - Heartmate 3 LVAD placement in 9/19.  - Echo (5/22): EF 25-30%, mild LVH, aortic valve difficult to visualize but appears to open with each beat, mildly dilated RV with moderately decreased systolic function. - Echo (5/24): EF 20-25% with midline interventricular septum, RV moderately dilated with moderately decreased systolic function, IVC normal.  4. OSA: CPAP use.  5. Prior smoker 6. Type 2 diabetes 7. Hyperlipidemia.  8. H/o NSVT 9. 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  10. RLE DVT: found in 9/19.  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 Subxiphoid abscess involving collection along driveline in 3/21, MSSA.  6/21 Rectus flap to cover abscess site.  12. Facial Zoster 3/21 13. 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   Current Outpatient Medications  Medication Sig Dispense Refill   acetaminophen (TYLENOL) 500 MG tablet Take 1,000 mg by mouth every 6 (six) hours as needed for mild pain.     carbamide peroxide (DEBROX) 6.5 % OTIC solution Place 5 drops into both ears 3 (three) times daily. 15 mL 0   cephALEXin (KEFLEX) 500 MG capsule TAKE 1 CAPSULE BY MOUTH THREE  TIMES DAILY 90 capsule 5   cyanocobalamin 500 MCG tablet Take 1,000 mcg by mouth daily.      docusate sodium (COLACE) 100 MG capsule Take 2 capsules (200 mg total) by mouth daily as needed for mild constipation. 10 capsule 0   fluticasone (FLONASE) 50 MCG/ACT nasal spray Place 1 spray into both nostrils 2 (two) times daily as needed for allergies. 9.9 mL 5   gabapentin (NEURONTIN) 300 MG capsule TAKE 2 CAPSULES BY MOUTH 4 TIMES DAILY 240 capsule 12   Ipratropium-Albuterol (COMBIVENT) 20-100 MCG/ACT AERS respimat Inhale 1 puff into the lungs every 6 (six) hours as needed for wheezing or shortness of breath. 4 g 5   nystatin cream (MYCOSTATIN) Apply 1 Application topically 3 (three) times daily. 30 g 5   pantoprazole (PROTONIX) 40 MG tablet Take 1 tablet (40 mg total) by mouth daily. 90 tablet 3   sacubitril-valsartan (ENTRESTO) 49-51 MG Take 1 tablet by mouth 2 (two) times daily. 180 tablet 3   sildenafil (VIAGRA) 100 MG tablet Take 0.5 tablets (50 mg total) by mouth daily as needed for erectile dysfunction. 10 tablet 6   thiamine 100 MG tablet Take 1 tablet (100 mg total) by mouth daily. 30 tablet 0   warfarin (COUMADIN) 5 MG tablet TAKE 0.5 (ONE-HALF) TABLET BY MOUTH EVERY TUESDAY AND ONE TABLET ONCE DAILY ON ALL OTHER DAYS OR AS INSTRUCTED BY HF CLINIC 30 tablet 8   Zinc Gluconate 100 MG TABS Take  1 tablet (100 mg total) by mouth daily. 90 tablet 3   zolpidem (AMBIEN) 5 MG tablet Take 1 tablet (5 mg total) by mouth at bedtime as needed for sleep. 30 tablet 5   amLODipine (NORVASC) 5 MG tablet Take 1 tablet (5 mg total) by mouth daily. 180 tablet 3   Cephalexin 500 MG tablet Take 500 mg by mouth 3 (three) times daily.     hydrALAZINE (APRESOLINE) 100 MG tablet Take 1 tablet (100 mg total) by mouth 3 (three) times daily. 270 tablet 3   hydrOXYzine (ATARAX) 50 MG tablet Take 1 tablet (50 mg total) by mouth 3 (three) times daily as needed for itching. 60 tablet 3   spironolactone (ALDACTONE) 25 MG tablet Take 1 tablet (25 mg total) by mouth daily. 90 tablet 3   No current facility-administered medications for this encounter.    Chlorhexidine, Atorvastatin, and Other  REVIEW OF SYSTEMS: All systems negative except as listed in HPI, PMH and Problem list.   LVAD INTERROGATION:  Please see LVAD nurse's note above for details.   I reviewed the LVAD parameters from today, and compared the results to the patient's prior recorded data.  No programming changes were made.  The LVAD is functioning within specified parameters.  The patient performs LVAD self-test daily.  LVAD interrogation was negative for any significant power changes, alarms or PI events/speed drops.  LVAD equipment check completed and is in good working order.  Back-up equipment present.   LVAD education done on emergency procedures and precautions and reviewed exit site care.   MAP 98  Physical Exam: General: Well appearing this am. NAD.  HEENT: Normal. Neck: Supple, JVP 7-8 cm. Carotids OK.  Cardiac:  Mechanical heart sounds with LVAD hum present.  Lungs:  CTAB, normal effort.  Abdomen:  NT, ND, no HSM. No bruits or masses. +BS  LVAD exit site: Well-healed and incorporated. Dressing dry and intact. No erythema or drainage. Stabilization device present and accurately applied. Driveline dressing changed daily per sterile  technique. Extremities:  Warm and dry. No cyanosis, clubbing, rash, or edema.  Neuro:  Alert & oriented x 3. Cranial nerves grossly intact. Moves all 4 extremities w/o difficulty. Affect pleasant      ASSESSMENT AND PLAN:   1. 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  Echo today showed moderate RV dysfunction and midline interventricular septum, no speed change. LVAD parameters stable.  NYHA class I.   MAP elevated but volume status looks ok on exam.  - BMET today.   - Continue spironolactone 25 mg daily.  - Increase Entresto to 49/51 bid, BMET again in 2 wks. Stay hydrated.    - Continue hydralazine 100 mg tid.  - Off Coreg with bradycardia and RV dysfunction by echo.  - Continue amlodipine 5 mg daily for BP control.  - He is off ASA with GI bleeding.  - Continue warfarin for INR 1.8-2.3.    - Would be transplant candidate eventually if he can quit smoking.  Will make transplant clinic referral when he is off totally.  2. Smoking: Still smoking.  He tried Chantix and Wellbutrin but they did not work.   3. RLE DVT: On warfarin for LVAD.  4. OSA: Continue CPAP.   5. Type II diabetes: Per PCP.  6. 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  Site looks good today.   - He is on long-term Keflex for MSSA suppression.  Followup with ID.  8. 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 No overt bleeding. - Check Hgb today.  - Off ASA, INR goal now 1.8-2.3.  - Continue monthly octreotide injections.   Followup in 2 months    Marca Ancona 08/11/2023

## 2023-08-19 ENCOUNTER — Ambulatory Visit (HOSPITAL_COMMUNITY): Payer: Self-pay | Admitting: Pharmacist

## 2023-08-19 LAB — POCT INR: INR: 2.1 (ref 2.0–3.0)

## 2023-08-21 DIAGNOSIS — B0239 Other herpes zoster eye disease: Secondary | ICD-10-CM | POA: Diagnosis not present

## 2023-08-21 DIAGNOSIS — E119 Type 2 diabetes mellitus without complications: Secondary | ICD-10-CM | POA: Diagnosis not present

## 2023-08-21 DIAGNOSIS — H2513 Age-related nuclear cataract, bilateral: Secondary | ICD-10-CM | POA: Diagnosis not present

## 2023-08-21 LAB — HM DIABETES EYE EXAM

## 2023-08-22 ENCOUNTER — Other Ambulatory Visit (HOSPITAL_COMMUNITY): Payer: Self-pay | Admitting: *Deleted

## 2023-08-22 ENCOUNTER — Ambulatory Visit (HOSPITAL_COMMUNITY)
Admission: RE | Admit: 2023-08-22 | Discharge: 2023-08-22 | Disposition: A | Discharge status: Home / Self Care | Payer: Medicare HMO | Source: Ambulatory Visit | Attending: Cardiology | Admitting: Cardiology

## 2023-08-22 ENCOUNTER — Ambulatory Visit (HOSPITAL_COMMUNITY)
Admission: RE | Admit: 2023-08-22 | Discharge: 2023-08-22 | Disposition: A | Discharge status: Home / Self Care | Payer: Medicare HMO | Source: Ambulatory Visit | Attending: Internal Medicine | Admitting: Internal Medicine

## 2023-08-22 DIAGNOSIS — Z95811 Presence of heart assist device: Secondary | ICD-10-CM | POA: Diagnosis not present

## 2023-08-22 DIAGNOSIS — I5022 Chronic systolic (congestive) heart failure: Secondary | ICD-10-CM | POA: Diagnosis not present

## 2023-08-22 DIAGNOSIS — K31811 Angiodysplasia of stomach and duodenum with bleeding: Secondary | ICD-10-CM | POA: Diagnosis not present

## 2023-08-22 LAB — BASIC METABOLIC PANEL
Anion gap: 12 (ref 5–15)
BUN: 22 mg/dL (ref 8–23)
CO2: 21 mmol/L — ABNORMAL LOW (ref 22–32)
Calcium: 9.6 mg/dL (ref 8.9–10.3)
Chloride: 106 mmol/L (ref 98–111)
Creatinine, Ser: 1.3 mg/dL — ABNORMAL HIGH (ref 0.61–1.24)
GFR, Estimated: >60 mL/min (ref >60)
Glucose, Bld: 141 mg/dL — ABNORMAL HIGH (ref 70–99)
Potassium: 4.4 mmol/L (ref 3.5–5.1)
Sodium: 139 mmol/L (ref 135–145)

## 2023-08-22 MED ORDER — OCTREOTIDE ACETATE 20 MG IM KIT
20.0000 mg | PACK | INTRAMUSCULAR | Status: DC
  Administered 2023-08-22: 20 mg via INTRAMUSCULAR

## 2023-08-22 MED ORDER — OCTREOTIDE ACETATE 20 MG IM KIT
PACK | INTRAMUSCULAR | Status: AC
  Filled 2023-08-22: qty 1

## 2023-08-25 ENCOUNTER — Ambulatory Visit (HOSPITAL_COMMUNITY): Payer: Self-pay | Admitting: Pharmacist

## 2023-08-25 DIAGNOSIS — Z7901 Long term (current) use of anticoagulants: Secondary | ICD-10-CM | POA: Diagnosis not present

## 2023-08-25 LAB — POCT INR: INR: 1.9 — AB (ref 2.0–3.0)

## 2023-09-02 ENCOUNTER — Ambulatory Visit (HOSPITAL_COMMUNITY): Payer: Self-pay | Admitting: Pharmacist

## 2023-09-02 LAB — POCT INR: INR: 2 (ref 2.0–3.0)

## 2023-09-08 ENCOUNTER — Ambulatory Visit (HOSPITAL_COMMUNITY): Payer: Self-pay | Admitting: Pharmacist

## 2023-09-08 LAB — POCT INR: INR: 2 (ref 2.0–3.0)

## 2023-09-16 ENCOUNTER — Ambulatory Visit (HOSPITAL_COMMUNITY): Payer: Self-pay | Admitting: Pharmacist

## 2023-09-16 LAB — POCT INR: INR: 2.1 (ref 2.0–3.0)

## 2023-09-19 ENCOUNTER — Ambulatory Visit (HOSPITAL_COMMUNITY)
Admission: RE | Admit: 2023-09-19 | Discharge: 2023-09-19 | Disposition: A | Discharge status: Home / Self Care | Payer: Medicare HMO | Source: Ambulatory Visit | Attending: Cardiology

## 2023-09-19 DIAGNOSIS — K31811 Angiodysplasia of stomach and duodenum with bleeding: Secondary | ICD-10-CM | POA: Insufficient documentation

## 2023-09-19 DIAGNOSIS — Z95811 Presence of heart assist device: Secondary | ICD-10-CM | POA: Diagnosis not present

## 2023-09-19 MED ORDER — OCTREOTIDE ACETATE 20 MG IM KIT
PACK | INTRAMUSCULAR | Status: AC
  Filled 2023-09-19: qty 1

## 2023-09-19 MED ORDER — OCTREOTIDE ACETATE 20 MG IM KIT
20.0000 mg | PACK | INTRAMUSCULAR | Status: DC
  Administered 2023-09-19: 20 mg via INTRAMUSCULAR

## 2023-09-23 ENCOUNTER — Ambulatory Visit (HOSPITAL_COMMUNITY): Payer: Self-pay | Admitting: Pharmacist

## 2023-09-23 DIAGNOSIS — Z7901 Long term (current) use of anticoagulants: Secondary | ICD-10-CM | POA: Diagnosis not present

## 2023-09-23 LAB — POCT INR: INR: 1.9 — AB (ref 2.0–3.0)

## 2023-09-30 ENCOUNTER — Ambulatory Visit: Payer: Medicare HMO | Admitting: Infectious Diseases

## 2023-09-30 ENCOUNTER — Encounter: Payer: Self-pay | Admitting: Infectious Diseases

## 2023-09-30 ENCOUNTER — Ambulatory Visit (HOSPITAL_COMMUNITY): Payer: Self-pay | Admitting: Pharmacist

## 2023-09-30 ENCOUNTER — Other Ambulatory Visit: Payer: Self-pay

## 2023-09-30 DIAGNOSIS — B369 Superficial mycosis, unspecified: Secondary | ICD-10-CM

## 2023-09-30 DIAGNOSIS — T827XXD Infection and inflammatory reaction due to other cardiac and vascular devices, implants and grafts, subsequent encounter: Secondary | ICD-10-CM | POA: Diagnosis not present

## 2023-09-30 DIAGNOSIS — Z95811 Presence of heart assist device: Secondary | ICD-10-CM | POA: Diagnosis not present

## 2023-09-30 DIAGNOSIS — B9561 Methicillin susceptible Staphylococcus aureus infection as the cause of diseases classified elsewhere: Secondary | ICD-10-CM | POA: Diagnosis not present

## 2023-09-30 LAB — POCT INR: INR: 2 (ref 2.0–3.0)

## 2023-09-30 MED ORDER — NYSTATIN 100000 UNIT/GM EX POWD
1.0000 | Freq: Three times a day (TID) | CUTANEOUS | 11 refills | Status: AC
Start: 2023-09-30 — End: ?

## 2023-09-30 MED ORDER — CEPHALEXIN 500 MG PO CAPS: 500.0000 mg | ORAL_CAPSULE | Freq: Three times a day (TID) | ORAL | 5 refills | Status: DC

## 2023-09-30 NOTE — Patient Instructions (Signed)
Refills sent in for your powder and the antibiotics.   Keep the keflex going three times a day.   Will see you back in 6 months.

## 2023-09-30 NOTE — Progress Notes (Signed)
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  No concerns today - his wife, [name] joins him.     ID Hx: Theodore Welch is a 62 y.o. 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   January-2020: MSSA infection of counter incision to mid abdomen; s/p serial debridements. Initially did not involve exit site at this time. Received IV Cefazolin x 4 weeks --> PO Cephalexin x 4 weeks.  May-2020: recurrent MSSA infection, now involving driveline exit site and secondary bacteremia. TEE deferred due to low sensitivity for detecting VAD endocarditis. 6 weeks IV Cefazolin s/p abd wall debridement --> cephalexin for chronic suppression for duration pump is in place.   March-2021 sudden onset subxiphoid abscess involving proximal driveline, MSSA on cultures. Blood cultures negative. S/P I&D of abdominal wall exit site and subxiphoid space. IV cefazolin x 8 weeks (May 10th 2021).  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   Apr 16, 2020 - readmitted following re-opening and bleeding of subxiphoid wound. +MSSA and taken for debridement 6/01. Required repair of outflow graft at this surgery as well (?sharp puncture vs degenerated graft d/t infection). Vertical Rectus Flap repair performed with serial debridements. BC (-). PICC Cefazolin + Rifabutin planned through 7/7  April 27, 2020 - bleeding from sternum r/t subxiphoid abscess --> debridement in OR, Cx again grew out MSSA. Back on IV cefazolin for treatment.   March 2022 - enterobacter cloacae growing from non-healing soft tissue wound. Added 2 weeks cipro to treat --> Resolved. Has  continued on cephalexin.     Discussed the use of AI scribe software for clinical note transcription with the patient, who gave verbal consent to proceed.  History of Present Illness   The patient, with a history of a chronic skin condition, presents for a routine follow-up. He reports continued use of prescribed antibiotics, taken three times a day, without any issues. He expresses a preference for maintaining this regimen due to its alignment with his other medication schedules.  The patient also reports persistent itching, particularly noticeable during dressing changes. He describes an urge to scratch, often unconsciously during sleep, leading to visible skin marks and occasional bleeding. He is currently managing the itching with hydroxyzine and a topical powder, both of which he finds helpful.  The patient's LVAD exit site continues to look good. Using VASH cleanser now.     Review of Systems  Constitutional:  Negative for chills and fever.  HENT:  Negative for tinnitus.   Eyes:  Negative for blurred vision and photophobia.  Respiratory:  Negative for cough and sputum production.   Cardiovascular:  Negative for chest pain.  Gastrointestinal:  Negative for diarrhea, nausea and vomiting.  Genitourinary:  Negative for dysuria.  Skin:  Negative for rash.  Neurological:  Negative for headaches.      Past Medical History:  Diagnosis Date   Abscess 01/2020   sternal abscess   Cardiomyopathy, unspecified (HCC)    CHF (congestive heart failure) (HCC)    Chronic back pain    Diabetes mellitus without complication (HCC)    Enlarged heart    Gastric ulcer    Gastroenteritis    H/O  degenerative disc disease    Hypertension    LVAD (left ventricular assist device) present [facility])     Outpatient Medications Prior to Visit  Medication Sig Dispense Refill   acetaminophen (TYLENOL) 500 MG tablet Take 1,000 mg by mouth every 6 (six) hours as needed for mild pain.     carbamide peroxide  (DEBROX) 6.5 % OTIC solution Place 5 drops into both ears 3 (three) times daily. 15 mL 0   cyanocobalamin 500 MCG tablet Take 1,000 mcg by mouth daily.      docusate sodium (COLACE) 100 MG capsule Take 2 capsules (200 mg total) by mouth daily as needed for mild constipation. 10 capsule 0   fluticasone (FLONASE) 50 MCG/ACT nasal spray Place 1 spray into both nostrils 2 (two) times daily as needed for allergies. 9.9 mL 5   gabapentin (NEURONTIN) 300 MG capsule TAKE 2 CAPSULES BY MOUTH 4 TIMES DAILY 240 capsule 12   hydrALAZINE (APRESOLINE) 100 MG tablet Take 1 tablet (100 mg total) by mouth 3 (three) times daily. 270 tablet 3   hydrOXYzine (ATARAX) 50 MG tablet Take 1 tablet (50 mg total) by mouth 3 (three) times daily as needed for itching. 60 tablet 3   Ipratropium-Albuterol (COMBIVENT) 20-100 MCG/ACT AERS respimat Inhale 1 puff into the lungs every 6 (six) hours as needed for wheezing or shortness of breath. 4 g 5   pantoprazole (PROTONIX) 40 MG tablet Take 1 tablet (40 mg total) by mouth daily. 90 tablet 3   sacubitril-valsartan (ENTRESTO) 49-51 MG Take 1 tablet by mouth 2 (two) times daily. 180 tablet 3   sildenafil (VIAGRA) 100 MG tablet Take 0.5 tablets (50 mg total) by mouth daily as needed for erectile dysfunction. 10 tablet 6   spironolactone (ALDACTONE) 25 MG tablet Take 1 tablet (25 mg total) by mouth daily. 90 tablet 3   thiamine 100 MG tablet Take 1 tablet (100 mg total) by mouth daily. 30 tablet 0   warfarin (COUMADIN) 5 MG tablet TAKE 0.5 (ONE-HALF) TABLET BY MOUTH EVERY TUESDAY AND ONE TABLET ONCE DAILY ON ALL OTHER DAYS OR AS INSTRUCTED BY HF CLINIC 30 tablet 8   Zinc Gluconate 100 MG TABS Take 1 tablet (100 mg total) by mouth daily. 90 tablet 3   zolpidem (AMBIEN) 5 MG tablet Take 1 tablet (5 mg total) by mouth at bedtime as needed for sleep. 30 tablet 5   cephALEXin (KEFLEX) 500 MG capsule TAKE 1 CAPSULE BY MOUTH THREE TIMES DAILY 90 capsule 5   Cephalexin 500 MG tablet Take 500 mg  by mouth 3 (three) times daily.     nystatin cream (MYCOSTATIN) Apply 1 Application topically 3 (three) times daily. 30 g 5   amLODipine (NORVASC) 5 MG tablet Take 1 tablet (5 mg total) by mouth daily. 180 tablet 3   No facility-administered medications prior to visit.     Allergies  Allergen Reactions   Chlorhexidine Rash    Blisters   Atorvastatin Other (See Comments)   Other Rash    Prep pads    Social History   Tobacco Use   Smoking status: Every Day    Current packs/day: 0.25    Average packs/day: 0.3 packs/day for 21.9 years (5.5 ttl pk-yrs)    Types: Cigarettes    Start date: 2003   Smokeless tobacco: Never   Tobacco comments:    slowed down  Vaping Use   Vaping status: Never Used  Substance Use Topics   Alcohol use: Not Currently  Drug use: Not Currently     Objective:   Vitals:   09/30/23 0927  Weight: 198 lb (89.8 kg)     Body mass index is 27.62 kg/m.   Physical Exam Vitals reviewed.  Constitutional:      Appearance: Normal appearance. He is not ill-appearing.  Eyes:     General: No scleral icterus.    Pupils: Pupils are equal, round, and reactive to light.  Cardiovascular:     Rate and Rhythm: Normal rate.     Comments: LVAD Humm +  Pulmonary:     Effort: Pulmonary effort is normal.  Abdominal:     Palpations: Abdomen is soft.     Tenderness: There is no abdominal tenderness.     Comments: Driveline dressing clean and dry. Other abdominal incisions completely healed over and open to air.   Skin:    General: Skin is warm and dry.     Capillary Refill: Capillary refill takes less than 2 seconds.  Neurological:     Mental Status: He is alert and oriented to person, place, and time.      Lab Results: Lab Results  Component Value Date   WBC 9.2 08/11/2023   HGB 13.7 08/11/2023   HCT 42.2 08/11/2023   MCV 85.3 08/11/2023   PLT 208 08/11/2023    Lab Results  Component Value Date   CREATININE 1.30 (H) 08/22/2023   BUN 22  08/22/2023   NA 139 08/22/2023   K 4.4 08/22/2023   CL 106 08/22/2023   CO2 21 (L) 08/22/2023    Sed Rate (mm/hr)  Date Value  05/28/2021 4  03/27/2021 1  01/16/2021 1   CRP (mg/dL)  Date Value  40/34/7425 0.6  03/27/2021 0.7  01/16/2021 0.7     Assessment & Plan:     LVAD Infection, MSSA Doing well tolerating the keflex TID. No changes in condition reported. Patient is compliant with current treatment and prefers to maintain current regimen. -Continue current regimen of Keflex three times a day. -No additional labs needed at next follow up.   Pruritus Patient reports relief from itching with current medication. -Continue current regimen of hydroxyzine as needed for itching. -Continue topical nystop powder   General Maintenance Has received flu and covid vaccines this season     No orders of the defined types were placed in this encounter.  Meds ordered this encounter  Medications   cephALEXin (KEFLEX) 500 MG capsule    Sig: Take 1 capsule (500 mg total) by mouth 3 (three) times daily.    Dispense:  90 capsule    Refill:  5   nystatin powder    Sig: Apply 1 Application topically 3 (three) times daily.    Dispense:  15 g    Refill:  11   Return in about 6 months (around 03/29/2024).   Rexene Alberts, MSN, NP-C [facility]  [email or web address] Pager: [phone] Office: [phone] [facility] Main Line: [phone]

## 2023-10-03 ENCOUNTER — Other Ambulatory Visit (HOSPITAL_COMMUNITY): Payer: Self-pay | Admitting: Unknown Physician Specialty

## 2023-10-03 DIAGNOSIS — Z95811 Presence of heart assist device: Secondary | ICD-10-CM

## 2023-10-03 DIAGNOSIS — Z7901 Long term (current) use of anticoagulants: Secondary | ICD-10-CM

## 2023-10-06 ENCOUNTER — Encounter (HOSPITAL_COMMUNITY): Payer: Self-pay | Admitting: Cardiology

## 2023-10-06 ENCOUNTER — Ambulatory Visit (HOSPITAL_COMMUNITY)
Admission: RE | Admit: 2023-10-06 | Discharge: 2023-10-06 | Disposition: A | Discharge status: Home / Self Care | Payer: Medicare HMO | Source: Ambulatory Visit | Attending: Cardiology | Admitting: Cardiology

## 2023-10-06 ENCOUNTER — Ambulatory Visit (HOSPITAL_COMMUNITY): Payer: Self-pay | Admitting: Pharmacist

## 2023-10-06 DIAGNOSIS — T827XXA Infection and inflammatory reaction due to other cardiac and vascular devices, implants and grafts, initial encounter: Secondary | ICD-10-CM | POA: Diagnosis not present

## 2023-10-06 DIAGNOSIS — I428 Other cardiomyopathies: Secondary | ICD-10-CM | POA: Diagnosis not present

## 2023-10-06 DIAGNOSIS — I5022 Chronic systolic (congestive) heart failure: Secondary | ICD-10-CM | POA: Insufficient documentation

## 2023-10-06 DIAGNOSIS — Z79890 Hormone replacement therapy (postmenopausal): Secondary | ICD-10-CM | POA: Diagnosis not present

## 2023-10-06 DIAGNOSIS — Z792 Long term (current) use of antibiotics: Secondary | ICD-10-CM | POA: Insufficient documentation

## 2023-10-06 DIAGNOSIS — Z86718 Personal history of other venous thrombosis and embolism: Secondary | ICD-10-CM | POA: Diagnosis not present

## 2023-10-06 DIAGNOSIS — E119 Type 2 diabetes mellitus without complications: Secondary | ICD-10-CM | POA: Insufficient documentation

## 2023-10-06 DIAGNOSIS — B9561 Methicillin susceptible Staphylococcus aureus infection as the cause of diseases classified elsewhere: Secondary | ICD-10-CM | POA: Insufficient documentation

## 2023-10-06 DIAGNOSIS — Z95811 Presence of heart assist device: Secondary | ICD-10-CM | POA: Diagnosis not present

## 2023-10-06 DIAGNOSIS — F17210 Nicotine dependence, cigarettes, uncomplicated: Secondary | ICD-10-CM | POA: Diagnosis not present

## 2023-10-06 DIAGNOSIS — Z7901 Long term (current) use of anticoagulants: Secondary | ICD-10-CM | POA: Diagnosis not present

## 2023-10-06 DIAGNOSIS — Z4509 Encounter for adjustment and management of other cardiac device: Secondary | ICD-10-CM | POA: Diagnosis present

## 2023-10-06 DIAGNOSIS — G4733 Obstructive sleep apnea (adult) (pediatric): Secondary | ICD-10-CM | POA: Diagnosis not present

## 2023-10-06 LAB — CBC
HCT: 42 % (ref 39.0–52.0)
Hemoglobin: 14 g/dL (ref 13.0–17.0)
MCH: 28.8 pg (ref 26.0–34.0)
MCHC: 33.3 g/dL (ref 30.0–36.0)
MCV: 86.4 fL (ref 80.0–100.0)
Platelets: 214 10*3/uL (ref 150–400)
RBC: 4.86 MIL/uL (ref 4.22–5.81)
RDW: 14.6 % (ref 11.5–15.5)
WBC: 10.3 10*3/uL (ref 4.0–10.5)
nRBC: 0 % (ref 0.0–0.2)

## 2023-10-06 LAB — BASIC METABOLIC PANEL
Anion gap: 8 (ref 5–15)
BUN: 11 mg/dL (ref 8–23)
CO2: 21 mmol/L — ABNORMAL LOW (ref 22–32)
Calcium: 9 mg/dL (ref 8.9–10.3)
Chloride: 107 mmol/L (ref 98–111)
Creatinine, Ser: 1.41 mg/dL — ABNORMAL HIGH (ref 0.61–1.24)
GFR, Estimated: 56 mL/min — ABNORMAL LOW (ref >60)
Glucose, Bld: 143 mg/dL — ABNORMAL HIGH (ref 70–99)
Potassium: 4.4 mmol/L (ref 3.5–5.1)
Sodium: 136 mmol/L (ref 135–145)

## 2023-10-06 LAB — LACTATE DEHYDROGENASE: LDH: 217 U/L — ABNORMAL HIGH (ref 98–192)

## 2023-10-06 LAB — PROTIME-INR
INR: 1.7 — ABNORMAL HIGH (ref 0.8–1.2)
Prothrombin Time: 20.5 s — ABNORMAL HIGH (ref 11.4–15.2)

## 2023-10-06 MED ORDER — AMLODIPINE BESYLATE 5 MG PO TABS: 10.0000 mg | ORAL_TABLET | Freq: Every day | ORAL | 3 refills | Status: DC

## 2023-10-06 NOTE — Patient Instructions (Addendum)
Increase Amlodipine to 10mg  daily Coumadin dosing per [name] Follow up in VAD Clinic in

## 2023-10-06 NOTE — Progress Notes (Addendum)
Patient presents for 2 mo f/u in VAD Clinic today with alone. Reports no problems with VAD equipment or concerns with drive line.  Pt reports he has been feeling good.  Denies lightheadedness, dizziness, falls, heart failure symptoms, and signs of bleeding. Pt endorses good appetite.   Pt's BP elevated today. Dr. Shirlee Latch increased Amlodipine to 10mg  daily.   [name] is changing his drive line dressing weekly using daily kit. Remains on Keflex TID per ID.   Receiving monthly Octreotide injections.   Vital Signs: Doppler Pressure: UTO Automatic BP: 107/92 (99) HR: 77 SR SPO2: 99% RA   Weight: 196.4 lbs w/o equipment Last weight: 195.2 lbs w/o equipment  VAD Indication: Destination Therapy due to smoking status   VAD interrogation & Equipment Management: Speed: 5800 Flow: 4.5 Power: 4.2 w    PI: 5.1 Hct: 38   Alarms: Low flow 11/12 Events: 50 PI Events  Fixed speed: 5800 Low speed limit: 5500  Primary Controller:  Replace back up battery in 7 months. Back up controller:   Replace back up battery in 16 months.    Annual Equipment Maintenance on UBC/PM was performed on 08/11/23.  I reviewed the LVAD parameters from today and compared the results to the patient's prior recorded data. LVAD interrogation was NEGATIVE for significant power changes, NEGATIVE for clinical alarms and NEGATIVE for PI events/speed drops. No programming changes were made and pump is functioning within specified parameters. Pt is performing daily controller and system monitor self tests along with completing weekly and monthly maintenance for LVAD equipment.  LVAD equipment check completed and is in good working order. Back-up equipment present.   Exit Site Care: Drive line is being maintained weekly by [name]. Dressing CDI. Anchor applied correctly. Pt has adequate supplies at home.  Device: N/A   BP & Labs:  Doppler UTO - Attempted doppler pressure x 3 without success   Hgb 14 -  No S/S of  bleeding. Specifically denies BRBPR.   LDH stable at 217 with established baseline of 150 - 220. Denies tea-colored urine. No power elevations noted on interrogation.   Patient Instructions: Increase Amlodipine to 10mg  daily Coumadin dosing per [name] Follow up in VAD Clinic in   [place] RN,BSN VAD Coordinator  Office: [phone]  24/7 Pager: [phone]   62 y.o. with history of nonischemic cardiomyopathy, RLE DVT, cirrhosis, smoking, and OSA returns for followup of CHF/LVAD placement.  Cardiomyopathy was diagnosed in 6/19 in [place], [place] at that time showed low output.  He was admitted to [facility] in 9/19 with low output HF and was started on milrinone and diuresed.  Unable to wean off milrinone.  He had a degree of RV failure, but this improved on milrinone.  Valvular heart disease also looked better with milrinone and diuresis.  On 08/03/18, he had Heartmate 3 LVAD placed.  Speed was optimized by ramp echo post-op.  Post-op course was relatively unremarkable.  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  No abscess on CT.  He was started on cefazolin IV for 6 wks.  BP-active meds decreased with low MAP.   He was admitted in 3/21 with subxiphoid abscess and cellulitis.  CT showed collection along the course of the driveline.  There was no evidence for sternal osteomyelitis.  Abscess was debrided, MSSA grew from wound cultures.  Wound vac was placed.  Patient was started on cefazolin, which was recently stopped  after completing 8 wks.   Patient was re-admitted later in 3/21 with facial Zoster.  He was treated with acyclovir and this has resolved.   He was admitted in 6/21 with bleeding from the site of the subxiphoid abscess as well as cellulitis.  He required multiple units of PRBCs.  He went to the OR initially for I&D.  Blood and wound cultures grew MSSA.  He then went back to the OR for rectus  flap over wound site (plastics).  He was started on cefazolin and rifabutin, has PICC.   He was admitted in 7/21 with GI bleeding, he had 6 units PRBCs.  EGD showed duodenal AVMs, treated with APC.  INR goal with warfarin lowered to 1.8-2.3.   Echo in 5/22 was a difficult study with EF 25-30%, mild LVH, aortic valve difficult to visualize but appears to open with each beat, mildly dilated RV with moderately decreased systolic function.  Echo in 7/23 was a very difficult study.  Echo was done today and reviewed, EF 20-25% with midline interventricular septum, RV moderately dilated with moderately decreased systolic function, IVC normal.   He returns for LVAD followup.   Still smoking a few cigarettes/day.  Weight is stable. MAP elevated at 99 today.  He has 40-50 PI events daily, no low flow alarms.  No significant exertional dyspnea.  No lightheadedness.  He is under more stress recently, has a grandson who is going to be living with him.    Denies LVAD alarms.  Reports taking Coumadin as prescribed and adherence to anticoagulation based dietary restrictions.  Denies bright red blood per rectum or melena, no dark urine or hematuria.  Gets monthly octreotide.   Labs (9/19): LDH 210, INR 2.17, WBCs 17.1 => 15, hgb 9.7, creatinine 0.72 Labs (10/19): K 4.3, creatinine 0.78 => 0.83, hgb 10.2 Labs (11/19): creatinine 0.79, hgb 10 Labs (2/20): K 3.9, creatinine 0.79 Labs (4/20): K 4, creatinine 1.15, hgb 11.4, LDH 199, INR 1.9 Labs (5/20): hgb 10.4, WBCs 8.5 Labs (6/20): K 4.2, creatinine 1.06, hgb 11.2 Labs (8/20): k 3.7, creatinine 1.0, hgb 11.5, LDH 186 Labs (10/20): K 3.5, creatinine 0.93, INR 2, LDH 221 Labs (12/20): hgb 15.6, K 3.7, creatinine 1.02, LDH 250 Labs (4/21): K 4.1, creatinine 0.86, hgb 11.1 Labs (6/21): K 4.1, creatinine 0.76, hgb 10, WBCs 11.2 Labs (8/21): hgb 10.6 Labs (10/21): hgb 13.2, creatinine 1.17 Labs (7/22): hgb 8, LDH 372, INR 1.9, creatinine 0.95 Labs (8/22): hgb  12.6 Labs (9/22): K 3.9, creatinine 1.1 Labs (11/22): creatinine 1.01 Labs (5/23): K 4.4, creatinine 0.72, hgb 13.3, LDH 214 Labs (7/23): K 3.8, creatinine 0.96 Labs (11/23): K 3.9, creatinine 1.02 Labs (3/24): LDL 219, hgb 13.4, K 5.4, creatinine 1.54 => 1.43 Labs (5/24): hgb 13 Labs (6/24): K 3.7, creatinine 0.99 Labs (10/24): K 4.4, creatinine 1.3  PMH: 1. Degenerative disc disease.  2. GERD 3. Chronic systolic CHF:  Nonischemic cardiomyopathy.  Dilated cardiomyopathy diagnosed 6/19 in [place].  LHC/RHC in 7/19 showed elevated filling pressures, low cardiac output, and no significant CAD.   - Echo (9/19): Severe LV dilation with EF 10-20%, moderate-severe MR, severely dilated RV with mildly decreased systolic function, severe TR.  - Cardiac MRI (9/19): EF 14%, moderate dilated LV, severely dilated RV with mod-severe systolic function and EF 25%, nonspecific RV insertion site LGE.  - RHC (5/19) on milrinone 0.375: mean RA 8, PA 40/19, mean PCWP 12, PAPi 2.65, CI 2.71.  - Echo (9/19) on milrinone and diuresed): EF 20-25%  with moderate LV dilation, moderately dilated RV with mildly decreased systolic function, mild-moderate MR, moderate TR, PASP 51 mmHg.  Cannot rule out noncompaction.  - Heartmate 3 LVAD placement in 9/19.  - Echo (5/22): EF 25-30%, mild LVH, aortic valve difficult to visualize but appears to open with each beat, mildly dilated RV with moderately decreased systolic function. - Echo (5/24): EF 20-25% with midline interventricular septum, RV moderately dilated with moderately decreased systolic function, IVC normal.  4. OSA: CPAP use.  5. Prior smoker 6. Type 2 diabetes 7. Hyperlipidemia.  8. H/o NSVT 9. 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  10. RLE DVT: found in 9/19.  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 Subxiphoid abscess involving collection along driveline in 3/21, MSSA.  6/21 Rectus flap to cover  abscess site.  12. Facial Zoster 3/21 13. 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   Current Outpatient Medications  Medication Sig Dispense Refill   acetaminophen (TYLENOL) 500 MG tablet Take 1,000 mg by mouth every 6 (six) hours as needed for mild pain.     carbamide peroxide (DEBROX) 6.5 % OTIC solution Place 5 drops into both ears 3 (three) times daily. 15 mL 0   cephALEXin (KEFLEX) 500 MG capsule Take 1 capsule (500 mg total) by mouth 3 (three) times daily. 90 capsule 5   cyanocobalamin 500 MCG tablet Take 1,000 mcg by mouth daily.      docusate sodium (COLACE) 100 MG capsule Take 2 capsules (200 mg total) by mouth daily as needed for mild constipation. 10 capsule 0   fluticasone (FLONASE) 50 MCG/ACT nasal spray Place 1 spray into both nostrils 2 (two) times daily as needed for allergies. 9.9 mL 5   gabapentin (NEURONTIN) 300 MG capsule TAKE 2 CAPSULES BY MOUTH 4 TIMES DAILY 240 capsule 12   hydrALAZINE (APRESOLINE) 100 MG tablet Take 1 tablet (100 mg total) by mouth 3 (three) times daily. 270 tablet 3   hydrOXYzine (ATARAX) 50 MG tablet Take 1 tablet (50 mg total) by mouth 3 (three) times daily as needed for itching. 60 tablet 3   Ipratropium-Albuterol (COMBIVENT) 20-100 MCG/ACT AERS respimat Inhale 1 puff into the lungs every 6 (six) hours as needed for wheezing or shortness of breath. 4 g 5   nystatin powder Apply 1 Application topically 3 (three) times daily. 15 g 11   pantoprazole (PROTONIX) 40 MG tablet Take 1 tablet (40 mg total) by mouth daily. 90 tablet 3   sacubitril-valsartan (ENTRESTO) 49-51 MG Take 1 tablet by mouth 2 (two) times daily. 180 tablet 3   sildenafil (VIAGRA) 100 MG tablet Take 0.5 tablets (50 mg total) by mouth daily as needed for erectile dysfunction. 10 tablet 6   spironolactone (ALDACTONE) 25 MG tablet Take 1 tablet (25 mg total) by mouth daily. 90 tablet 3   thiamine 100 MG tablet Take 1 tablet (100 mg total) by mouth daily. 30 tablet 0    warfarin (COUMADIN) 5 MG tablet TAKE 0.5 (ONE-HALF) TABLET BY MOUTH EVERY TUESDAY AND ONE TABLET ONCE DAILY ON ALL OTHER DAYS OR AS INSTRUCTED BY HF CLINIC 30 tablet 8   Zinc Gluconate 100 MG TABS Take 1 tablet (100 mg total) by mouth daily. 90 tablet 3   zolpidem (AMBIEN) 5 MG tablet Take 1 tablet (5 mg total) by mouth at bedtime as needed for sleep. 30 tablet 5   amLODipine (NORVASC) 5 MG tablet Take 2 tablets (10 mg total) by  mouth daily. 180 tablet 3   No current facility-administered medications for this encounter.    Chlorhexidine, Atorvastatin, and Other  REVIEW OF SYSTEMS: All systems negative except as listed in HPI, PMH and Problem list.   LVAD INTERROGATION:  Please see LVAD nurse's note above for details.   I reviewed the LVAD parameters from today, and compared the results to the patient's prior recorded data.  No programming changes were made.  The LVAD is functioning within specified parameters.  The patient performs LVAD self-test daily.  LVAD interrogation was negative for any significant power changes, alarms or PI events/speed drops.  LVAD equipment check completed and is in good working order.  Back-up equipment present.   LVAD education done on emergency procedures and precautions and reviewed exit site care.   MAP 99  Physical Exam: General: Well appearing this am. NAD.  HEENT: Normal. Neck: Supple, JVP 7-8 cm. Carotids OK.  Cardiac:  Mechanical heart sounds with LVAD hum present.  Lungs:  CTAB, normal effort.  Abdomen:  NT, ND, no HSM. No bruits or masses. +BS  LVAD exit site: Well-healed and incorporated. Dressing dry and intact. No erythema or drainage. Stabilization device present and accurately applied. Driveline dressing changed daily per sterile technique. Extremities:  Warm and dry. No cyanosis, clubbing, rash, or edema.  Neuro:  Alert & oriented x 3. Cranial nerves grossly intact. Moves all 4 extremities w/o difficulty. Affect pleasant      ASSESSMENT AND  PLAN:   1. 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  Echo today showed moderate RV dysfunction and midline interventricular septum, no speed change. LVAD parameters stable.  NYHA class I.   MAP elevated but volume status looks ok on exam.  - BMET today.   - Continue spironolactone 25 mg daily.  - Continue Entresto 49/51 bid    - Continue hydralazine 100 mg tid.  - Off Coreg with bradycardia and RV dysfunction by echo.  - Increase amlodipine to 10 mg daily.  - He is off ASA with GI bleeding.  - Continue warfarin for INR 1.8-2.3.    - Would be transplant candidate eventually if he can quit smoking.  Will make transplant clinic referral when he is off totally.  2. Smoking: Still smoking.  He tried Chantix and Wellbutrin but they did not work.   3. RLE DVT: On warfarin for LVAD.  4. OSA: Continue CPAP.   5. Type II diabetes: Per PCP.  6. 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  Site looks good today.   - He is on long-term Keflex for MSSA suppression.  Followup with ID.  8. 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 No overt bleeding. - Check Hgb today.  - Off ASA, INR goal now 1.8-2.3.  - Continue monthly octreotide injections.   Followup in 2 months    Marca Ancona 10/06/2023

## 2023-10-14 ENCOUNTER — Ambulatory Visit (HOSPITAL_COMMUNITY): Payer: Self-pay | Admitting: Pharmacist

## 2023-10-14 LAB — POCT INR: INR: 2 (ref 2.0–3.0)

## 2023-10-17 ENCOUNTER — Ambulatory Visit (HOSPITAL_COMMUNITY)
Admission: RE | Admit: 2023-10-17 | Discharge: 2023-10-17 | Disposition: A | Discharge status: Home / Self Care | Payer: Medicare HMO | Source: Ambulatory Visit | Attending: Cardiology

## 2023-10-17 DIAGNOSIS — K31811 Angiodysplasia of stomach and duodenum with bleeding: Secondary | ICD-10-CM | POA: Insufficient documentation

## 2023-10-17 DIAGNOSIS — Z95811 Presence of heart assist device: Secondary | ICD-10-CM | POA: Diagnosis not present

## 2023-10-17 MED ORDER — OCTREOTIDE ACETATE 20 MG IM KIT
PACK | INTRAMUSCULAR | Status: AC
  Administered 2023-10-17: 20 mg via INTRAMUSCULAR
  Filled 2023-10-17: qty 1

## 2023-10-17 MED ORDER — OCTREOTIDE ACETATE 20 MG IM KIT: 20.0000 mg | PACK | INTRAMUSCULAR | Status: DC

## 2023-10-21 ENCOUNTER — Ambulatory Visit (HOSPITAL_COMMUNITY): Payer: Self-pay | Admitting: Pharmacist

## 2023-10-21 DIAGNOSIS — Z7901 Long term (current) use of anticoagulants: Secondary | ICD-10-CM | POA: Diagnosis not present

## 2023-10-21 LAB — POCT INR: INR: 2.1 (ref 2.0–3.0)

## 2023-10-23 ENCOUNTER — Telehealth: Payer: Self-pay

## 2023-10-23 ENCOUNTER — Other Ambulatory Visit (HOSPITAL_COMMUNITY): Payer: Self-pay

## 2023-10-23 NOTE — Telephone Encounter (Signed)
Pharmacy Patient Advocate Encounter  Received notification from [facility] that Prior Authorization for NYSTATIN POWDER has been APPROVED from 11/18/22 to 11/17/24. Ran test claim, Copay is $0. This test claim was processed through [facility]- copay amounts may vary at other pharmacies due to pharmacy/plan contracts, or as the patient moves through the different stages of their insurance plan.   PA #/Case ID/Reference #: [id number]

## 2023-10-23 NOTE — Telephone Encounter (Signed)
Pharmacy Patient Advocate Encounter   Received notification from CoverMyMeds that prior authorization for NYSTATIN POWDER  is required/requested.   Insurance verification completed.   The patient is insured through [employer] .   Per test claim: PA required; PA submitted to above mentioned insurance via CoverMyMeds Key/confirmation #/EOC [id number] Status is pending

## 2023-10-28 ENCOUNTER — Ambulatory Visit (HOSPITAL_COMMUNITY): Payer: Self-pay | Admitting: Pharmacist

## 2023-10-28 LAB — POCT INR: INR: 1.9 — AB (ref 2.0–3.0)

## 2023-11-04 ENCOUNTER — Ambulatory Visit (HOSPITAL_COMMUNITY): Payer: Self-pay | Admitting: Pharmacist

## 2023-11-04 LAB — POCT INR: INR: 2.1 (ref 2.0–3.0)

## 2023-11-07 ENCOUNTER — Other Ambulatory Visit (HOSPITAL_COMMUNITY): Payer: Self-pay | Admitting: *Deleted

## 2023-11-10 ENCOUNTER — Ambulatory Visit (HOSPITAL_COMMUNITY): Payer: Self-pay | Admitting: Pharmacist

## 2023-11-10 LAB — POCT INR: INR: 1.9 — AB (ref 2.0–3.0)

## 2023-11-14 ENCOUNTER — Ambulatory Visit (HOSPITAL_COMMUNITY)
Admission: RE | Admit: 2023-11-14 | Discharge: 2023-11-14 | Disposition: A | Discharge status: Home / Self Care | Payer: Medicare HMO | Source: Ambulatory Visit | Attending: Cardiology

## 2023-11-14 DIAGNOSIS — K31811 Angiodysplasia of stomach and duodenum with bleeding: Secondary | ICD-10-CM | POA: Insufficient documentation

## 2023-11-14 DIAGNOSIS — Z95811 Presence of heart assist device: Secondary | ICD-10-CM | POA: Diagnosis not present

## 2023-11-14 MED ORDER — OCTREOTIDE ACETATE 20 MG IM KIT: 20.0000 mg | PACK | INTRAMUSCULAR | Status: DC

## 2023-11-14 MED ORDER — OCTREOTIDE ACETATE 20 MG IM KIT
PACK | INTRAMUSCULAR | Status: AC
  Administered 2023-11-14: 20 mg via INTRAMUSCULAR
  Filled 2023-11-14: qty 1

## 2023-11-17 DIAGNOSIS — Z7901 Long term (current) use of anticoagulants: Secondary | ICD-10-CM | POA: Diagnosis not present

## 2023-11-18 ENCOUNTER — Ambulatory Visit (HOSPITAL_COMMUNITY): Payer: Self-pay | Admitting: Pharmacist

## 2023-11-18 LAB — POCT INR: INR: 2 (ref 2.0–3.0)

## 2023-11-25 ENCOUNTER — Ambulatory Visit (HOSPITAL_COMMUNITY): Payer: Self-pay | Admitting: Pharmacist

## 2023-11-25 LAB — POCT INR: INR: 2.1 (ref 2.0–3.0)

## 2023-11-26 ENCOUNTER — Encounter (HOSPITAL_COMMUNITY): Payer: Medicare HMO | Admitting: Cardiology

## 2023-12-03 ENCOUNTER — Ambulatory Visit (HOSPITAL_COMMUNITY): Payer: Self-pay | Admitting: Pharmacist

## 2023-12-03 LAB — POCT INR: INR: 2.1 (ref 2.0–3.0)

## 2023-12-09 ENCOUNTER — Ambulatory Visit (HOSPITAL_COMMUNITY): Payer: Self-pay | Admitting: Pharmacist

## 2023-12-09 LAB — POCT INR: INR: 1.9 — AB (ref 2.0–3.0)

## 2023-12-12 ENCOUNTER — Ambulatory Visit (HOSPITAL_COMMUNITY)
Admission: RE | Admit: 2023-12-12 | Discharge: 2023-12-12 | Disposition: A | Discharge status: Home / Self Care | Payer: 59 | Source: Ambulatory Visit | Attending: Cardiology | Admitting: Cardiology

## 2023-12-12 DIAGNOSIS — K31811 Angiodysplasia of stomach and duodenum with bleeding: Secondary | ICD-10-CM | POA: Insufficient documentation

## 2023-12-12 DIAGNOSIS — Z95811 Presence of heart assist device: Secondary | ICD-10-CM | POA: Diagnosis present

## 2023-12-12 MED ORDER — OCTREOTIDE ACETATE 20 MG IM KIT
20.0000 mg | PACK | INTRAMUSCULAR | Status: DC
  Administered 2023-12-12: 20 mg via INTRAMUSCULAR

## 2023-12-12 MED ORDER — OCTREOTIDE ACETATE 20 MG IM KIT
PACK | INTRAMUSCULAR | Status: AC
  Filled 2023-12-12: qty 1

## 2023-12-16 ENCOUNTER — Ambulatory Visit (HOSPITAL_COMMUNITY): Payer: Self-pay | Admitting: Pharmacist

## 2023-12-16 ENCOUNTER — Other Ambulatory Visit (HOSPITAL_COMMUNITY): Payer: Self-pay

## 2023-12-16 DIAGNOSIS — Z7901 Long term (current) use of anticoagulants: Secondary | ICD-10-CM

## 2023-12-16 DIAGNOSIS — Z95811 Presence of heart assist device: Secondary | ICD-10-CM

## 2023-12-16 LAB — POCT INR: INR: 2 (ref 2.0–3.0)

## 2023-12-17 ENCOUNTER — Ambulatory Visit (HOSPITAL_COMMUNITY)
Admission: RE | Admit: 2023-12-17 | Discharge: 2023-12-17 | Disposition: A | Discharge status: Home / Self Care | Payer: 59 | Source: Ambulatory Visit | Attending: Cardiology | Admitting: Cardiology

## 2023-12-17 ENCOUNTER — Encounter (HOSPITAL_COMMUNITY): Payer: Self-pay

## 2023-12-17 DIAGNOSIS — I5022 Chronic systolic (congestive) heart failure: Secondary | ICD-10-CM | POA: Insufficient documentation

## 2023-12-17 DIAGNOSIS — E119 Type 2 diabetes mellitus without complications: Secondary | ICD-10-CM | POA: Insufficient documentation

## 2023-12-17 DIAGNOSIS — I420 Dilated cardiomyopathy: Secondary | ICD-10-CM | POA: Insufficient documentation

## 2023-12-17 DIAGNOSIS — Z7901 Long term (current) use of anticoagulants: Secondary | ICD-10-CM | POA: Diagnosis not present

## 2023-12-17 DIAGNOSIS — G4733 Obstructive sleep apnea (adult) (pediatric): Secondary | ICD-10-CM | POA: Diagnosis not present

## 2023-12-17 DIAGNOSIS — Z86718 Personal history of other venous thrombosis and embolism: Secondary | ICD-10-CM | POA: Insufficient documentation

## 2023-12-17 DIAGNOSIS — Z716 Tobacco abuse counseling: Secondary | ICD-10-CM | POA: Insufficient documentation

## 2023-12-17 DIAGNOSIS — Z79899 Other long term (current) drug therapy: Secondary | ICD-10-CM | POA: Insufficient documentation

## 2023-12-17 DIAGNOSIS — F17210 Nicotine dependence, cigarettes, uncomplicated: Secondary | ICD-10-CM | POA: Diagnosis not present

## 2023-12-17 DIAGNOSIS — Z95811 Presence of heart assist device: Secondary | ICD-10-CM | POA: Insufficient documentation

## 2023-12-17 DIAGNOSIS — Z4801 Encounter for change or removal of surgical wound dressing: Secondary | ICD-10-CM | POA: Diagnosis present

## 2023-12-17 LAB — CBC
HCT: 43 % (ref 39.0–52.0)
Hemoglobin: 14.5 g/dL (ref 13.0–17.0)
MCH: 28.9 pg (ref 26.0–34.0)
MCHC: 33.7 g/dL (ref 30.0–36.0)
MCV: 85.8 fL (ref 80.0–100.0)
Platelets: 238 10*3/uL (ref 150–400)
RBC: 5.01 MIL/uL (ref 4.22–5.81)
RDW: 13.9 % (ref 11.5–15.5)
WBC: 10.3 10*3/uL (ref 4.0–10.5)
nRBC: 0 % (ref 0.0–0.2)

## 2023-12-17 LAB — BASIC METABOLIC PANEL
Anion gap: 11 (ref 5–15)
BUN: 15 mg/dL (ref 8–23)
CO2: 20 mmol/L — ABNORMAL LOW (ref 22–32)
Calcium: 9.6 mg/dL (ref 8.9–10.3)
Chloride: 107 mmol/L (ref 98–111)
Creatinine, Ser: 1.28 mg/dL — ABNORMAL HIGH (ref 0.61–1.24)
GFR, Estimated: >60 mL/min (ref >60)
Glucose, Bld: 166 mg/dL — ABNORMAL HIGH (ref 70–99)
Potassium: 4.5 mmol/L (ref 3.5–5.1)
Sodium: 138 mmol/L (ref 135–145)

## 2023-12-17 LAB — PROTIME-INR
INR: 1.7 — ABNORMAL HIGH (ref 0.8–1.2)
Prothrombin Time: 19.8 s — ABNORMAL HIGH (ref 11.4–15.2)

## 2023-12-17 LAB — LACTATE DEHYDROGENASE: LDH: 230 U/L — ABNORMAL HIGH (ref 98–192)

## 2023-12-17 MED ORDER — FLUTICASONE PROPIONATE 50 MCG/ACT NA SUSP: 1.0000 | Freq: Two times a day (BID) | NASAL | 5 refills | Status: DC | PRN

## 2023-12-17 NOTE — Progress Notes (Signed)
Patient presents for 2 mo f/u in [facility] Clinic today with alone. Reports no problems with VAD equipment or concerns with drive line.  Pt reports he has been feeling good.  Denies lightheadedness, dizziness, falls, heart failure symptoms, and signs of bleeding. Pt endorses good appetite.   Theod[place] is changing his drive line dressing weekly using daily kit. Pt reports that he had a rash underneath his anchor recently. Theodore Welch states that the area had a foul odor. Pt does not have an anchor on today. Dressing changed in clinic today. Pt refuses to have anchor place and refuses to try to new cath grip anchor.  Remains on Keflex TID per ID.   Receiving monthly Octreotide injections.   Pts flonase has been refilled today.  Vital Signs: Doppler Pressure: 84 Automatic BP: 89/77 (82) HR: 77 SR SPO2: 98% RA   Weight: 194 lbs w/o equipment Last weight: 196.4 lbs w/o equipment  VAD Indication: Destination Therapy due to smoking status   VAD interrogation & Equipment Management: Speed: 5800 Flow: 4.5 Power: 4.7 w    PI: 7.4 Hct: 42   Alarms: none Events: 40-50 PI Events  Fixed speed: 5800 Low speed limit: 5500  Primary Controller:  Expired. Replaced with [id number]. Replace back up battery in 31 months. Back up controller:   Replace back up battery in 16 months.    Annual Equipment Maintenance on UBC/PM was performed on 08/11/23.  I reviewed the LVAD parameters from today and compared the results to the patient's prior recorded data. LVAD interrogation was NEGATIVE for significant power changes, NEGATIVE for clinical alarms and NEGATIVE for PI events/speed drops. No programming changes were made and pump is functioning within specified parameters. Pt is performing daily controller and system monitor self tests along with completing weekly and monthly maintenance for LVAD equipment.  LVAD equipment check completed and is in good working order. Back-up equipment present.   Exit Site  Care: Drive line is being maintained weekly by [name]. Dressing CDI. Existing VAD dressing removed and site care performed using sterile technique. Drive line exit site cleaned with Vashe x 2, allowed to dry, and Vashe moistened 2 x 2 placed around driveline. Covered with 4 x 4. Exit site healed and incorporated, the velour is significantly exposed at exit site. No redness, tenderness, drainage, foul odor or rash noted. Pt refused anchor placement. Pt given 7 daily kits, adhesive removed and 12 anchors for home use.      Device: N/A   BP & Labs:  Doppler 84 - correlates with Map   Hgb 14.5 -  No S/S of bleeding. Specifically denies BRBPR.   LDH stable at 230 with established baseline of 150 - 220. Denies tea-colored urine. No power elevations noted on interrogation.   Patient Instructions: No change in medications Coumadin dosing by [facility] Return to clinic in 2 months with complete echo   Theodore Adam RN,BSN VAD Coordinator  Office: [phone]  24/7 Pager: [phone]   63 y.o. with history of nonischemic cardiomyopathy, RLE DVT, cirrhosis, smoking, and OSA returns for followup of CHF/LVAD placement.  Cardiomyopathy was diagnosed in 6/19 in [place], [place] at that time showed low output.  He was admitted to [facility] in 9/19 with low output HF and was started on milrinone and diuresed.  Unable to wean off milrinone.  He had a degree of RV failure, but this improved on milrinone.  Valvular heart disease also looked better with milrinone and diuresis.  On 08/03/18, he had Heartmate 3 LVAD  placed.  Speed was optimized by ramp echo post-op.  Post-op course was relatively unremarkable.  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  No abscess on CT.  He was started on cefazolin IV for 6 wks.  BP-active meds decreased with low MAP.   He was admitted in 3/21 with subxiphoid abscess and cellulitis.  CT  showed collection along the course of the driveline.  There was no evidence for sternal osteomyelitis.  Abscess was debrided, MSSA grew from wound cultures.  Wound vac was placed.  Patient was started on cefazolin, which was recently stopped after completing 8 wks.   Patient was re-admitted later in 3/21 with facial Zoster.  He was treated with acyclovir and this has resolved.   He was admitted in 6/21 with bleeding from the site of the subxiphoid abscess as well as cellulitis.  He required multiple units of PRBCs.  He went to the OR initially for I&D.  Blood and wound cultures grew MSSA.  He then went back to the OR for rectus flap over wound site (plastics).  He was started on cefazolin and rifabutin, has PICC.   He was admitted in 7/21 with GI bleeding, he had 6 units PRBCs.  EGD showed duodenal AVMs, treated with APC.  INR goal with warfarin lowered to 1.8-2.3.   Echo in 5/22 was a difficult study with EF 25-30%, mild LVH, aortic valve difficult to visualize but appears to open with each beat, mildly dilated RV with moderately decreased systolic function.  Echo in 7/23 was a very difficult study.  Echo was done today and reviewed, EF 20-25% with midline interventricular septum, RV moderately dilated with moderately decreased systolic function, IVC normal.   He returns for LVAD followup.   Still smoking a few cigarettes/day.  Weight is down 2 lbs.  MAP controlled at 84.  He is dyspneic with strenuous activity like walking up a hill, but generally no problems with his usual activities.  No lightheadedness.  No driveline drainage. Still with occasional PI events on device interrogation.    Denies LVAD alarms.  Reports taking Coumadin as prescribed and adherence to anticoagulation based dietary restrictions.  Denies bright red blood per rectum or melena, no dark urine or hematuria.  Gets monthly octreotide.   Labs (9/19): LDH 210, INR 2.17, WBCs 17.1 => 15, hgb 9.7, creatinine 0.72 Labs (10/19): K  4.3, creatinine 0.78 => 0.83, hgb 10.2 Labs (11/19): creatinine 0.79, hgb 10 Labs (2/20): K 3.9, creatinine 0.79 Labs (4/20): K 4, creatinine 1.15, hgb 11.4, LDH 199, INR 1.9 Labs (5/20): hgb 10.4, WBCs 8.5 Labs (6/20): K 4.2, creatinine 1.06, hgb 11.2 Labs (8/20): k 3.7, creatinine 1.0, hgb 11.5, LDH 186 Labs (10/20): K 3.5, creatinine 0.93, INR 2, LDH 221 Labs (12/20): hgb 15.6, K 3.7, creatinine 1.02, LDH 250 Labs (4/21): K 4.1, creatinine 0.86, hgb 11.1 Labs (6/21): K 4.1, creatinine 0.76, hgb 10, WBCs 11.2 Labs (8/21): hgb 10.6 Labs (10/21): hgb 13.2, creatinine 1.17 Labs (7/22): hgb 8, LDH 372, INR 1.9, creatinine 0.95 Labs (8/22): hgb 12.6 Labs (9/22): K 3.9, creatinine 1.1 Labs (11/22): creatinine 1.01 Labs (5/23): K 4.4, creatinine 0.72, hgb 13.3, LDH 214 Labs (7/23): K 3.8, creatinine 0.96 Labs (11/23): K 3.9, creatinine 1.02 Labs (3/24): LDL 219, hgb 13.4, K 5.4, creatinine 1.54 => 1.43 Labs (5/24): hgb 13 Labs (6/24): K 3.7, creatinine 0.99 Labs (10/24): K 4.4, creatinine 1.3 Labs (  11/24): hgb 14, K 4.4, creatinine 1.41  PMH: 1. Degenerative disc disease.  2. GERD 3. Chronic systolic CHF:  Nonischemic cardiomyopathy.  Dilated cardiomyopathy diagnosed 6/19 in [place].  LHC/RHC in 7/19 showed elevated filling pressures, low cardiac output, and no significant CAD.   - Echo (9/19): Severe LV dilation with EF 10-20%, moderate-severe MR, severely dilated RV with mildly decreased systolic function, severe TR.  - Cardiac MRI (9/19): EF 14%, moderate dilated LV, severely dilated RV with mod-severe systolic function and EF 25%, nonspecific RV insertion site LGE.  - RHC (5/19) on milrinone 0.375: mean RA 8, PA 40/19, mean PCWP 12, PAPi 2.65, CI 2.71.  - Echo (9/19) on milrinone and diuresed): EF 20-25% with moderate LV dilation, moderately dilated RV with mildly decreased systolic function, mild-moderate MR, moderate TR, PASP 51 mmHg.  Cannot rule out noncompaction.  - Heartmate 3  LVAD placement in 9/19.  - Echo (5/22): EF 25-30%, mild LVH, aortic valve difficult to visualize but appears to open with each beat, mildly dilated RV with moderately decreased systolic function. - Echo (5/24): EF 20-25% with midline interventricular septum, RV moderately dilated with moderately decreased systolic function, IVC normal.  4. OSA: CPAP use.  5. Prior smoker 6. Type 2 diabetes 7. Hyperlipidemia.  8. H/o NSVT 9. 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  10. RLE DVT: found in 9/19.  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 Subxiphoid abscess involving collection along driveline in 3/21, MSSA.  6/21 Rectus flap to cover abscess site.  12. Facial Zoster 3/21 13. 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   Current Outpatient Medications  Medication Sig Dispense Refill   acetaminophen (TYLENOL) 500 MG tablet Take 1,000 mg by mouth every 6 (six) hours as needed for mild pain.     amLODipine (NORVASC) 5 MG tablet Take 2 tablets (10 mg total) by mouth daily. 180 tablet 3   carbamide peroxide (DEBROX) 6.5 % OTIC solution Place 5 drops into both ears 3 (three) times daily. 15 mL 0   cephALEXin (KEFLEX) 500 MG capsule Take 1 capsule (500 mg total) by mouth 3 (three) times daily. 90 capsule 5   cyanocobalamin 500 MCG tablet Take 1,000 mcg by mouth daily.      docusate sodium (COLACE) 100 MG capsule Take 2 capsules (200 mg total) by mouth daily as needed for mild constipation. 10 capsule 0   gabapentin (NEURONTIN) 300 MG capsule TAKE 2 CAPSULES BY MOUTH 4 TIMES DAILY 240 capsule 12   hydrALAZINE (APRESOLINE) 100 MG tablet Take 1 tablet (100 mg total) by mouth 3 (three) times daily. 270 tablet 3   hydrOXYzine (ATARAX) 50 MG tablet Take 1 tablet (50 mg total) by mouth 3 (three) times daily as needed for itching. 60 tablet 3   Ipratropium-Albuterol (COMBIVENT) 20-100 MCG/ACT AERS respimat Inhale 1 puff into the lungs every  6 (six) hours as needed for wheezing or shortness of breath. 4 g 5   nystatin powder Apply 1 Application topically 3 (three) times daily. 15 g 11   pantoprazole (PROTONIX) 40 MG tablet Take 1 tablet (40 mg total) by mouth daily. 90 tablet 3   sacubitril-valsartan (ENTRESTO) 49-51 MG Take 1 tablet by mouth 2 (two) times daily. 180 tablet 3   sildenafil (VIAGRA) 100 MG tablet Take 0.5 tablets (50 mg total) by mouth daily as needed for erectile dysfunction. 10 tablet 6   spironolactone (ALDACTONE) 25 MG tablet Take 1 tablet (25  mg total) by mouth daily. 90 tablet 3   thiamine 100 MG tablet Take 1 tablet (100 mg total) by mouth daily. 30 tablet 0   warfarin (COUMADIN) 5 MG tablet TAKE 0.5 (ONE-HALF) TABLET BY MOUTH EVERY TUESDAY AND ONE TABLET ONCE DAILY ON ALL OTHER DAYS OR AS INSTRUCTED BY HF CLINIC 30 tablet 8   Zinc Gluconate 100 MG TABS Take 1 tablet (100 mg total) by mouth daily. 90 tablet 3   zolpidem (AMBIEN) 5 MG tablet Take 1 tablet (5 mg total) by mouth at bedtime as needed for sleep. 30 tablet 5   fluticasone (FLONASE) 50 MCG/ACT nasal spray Place 1 spray into both nostrils 2 (two) times daily as needed for allergies. 9.9 mL 5   No current facility-administered medications for this encounter.    Chlorhexidine, Atorvastatin, and Other  REVIEW OF SYSTEMS: All systems negative except as listed in HPI, PMH and Problem list.   LVAD INTERROGATION:  Please see LVAD nurse's note above for full interrogation.  I reviewed with the nurse.   I reviewed the LVAD parameters from today, and compared the results to the patient's prior recorded data.  No programming changes were made.  The LVAD is functioning within specified parameters.  The patient performs LVAD self-test daily.  LVAD interrogation was negative for any significant power changes, alarms or PI events/speed drops.  LVAD equipment check completed and is in good working order.  Back-up equipment present.   LVAD education done on emergency  procedures and precautions and reviewed exit site care.   BP (!) 84/0   Pulse 77   Ht 5\' 11"  (1.803 m)   Wt 88 kg (194 lb)   SpO2 98%   BMI 27.06 kg/m  MAP 84  Physical Exam: General: Well appearing this am. NAD.  HEENT: Normal. Neck: Supple, JVP 7-8 cm. Carotids OK.  Cardiac:  Mechanical heart sounds with LVAD hum present.  Lungs:  CTAB, normal effort.  Abdomen:  NT, ND, no HSM. No bruits or masses. +BS  LVAD exit site: Well-healed and incorporated. Dressing dry and intact. No erythema or drainage. Stabilization device present and accurately applied. Driveline dressing changed daily per sterile technique. Extremities:  Warm and dry. No cyanosis, clubbing, rash, or edema.  Neuro:  Alert & oriented x 3. Cranial nerves grossly intact. Moves all 4 extremities w/o difficulty. Affect pleasant      ASSESSMENT AND PLAN:   1. 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  Echo today showed moderate RV dysfunction and midline interventricular septum, no speed change. LVAD parameters reviewed and stable.  NYHA class I. MAP controlled. He is not volume overloaded on exam.   - BMET, LDH today.   - Continue spironolactone 25 mg daily.  - Continue Entresto 49/51 bid    - Continue hydralazine 100 mg tid.  - Off Coreg with bradycardia and RV dysfunction by echo.  - Continue amlodipine 10 mg daily.  - He is off ASA with GI bleeding.  - Continue warfarin for INR 1.8-2.3.    - Would be transplant candidate eventually if he can quit smoking.  Will make transplant clinic referral when he is off totally.  2. Smoking: Still smoking.  He tried Chantix and Wellbutrin but they did not work.  We discussed cessation.  3. RLE DVT: On warfarin for LVAD. INR sent today.  4. OSA: Continue CPAP.   5. Type II diabetes: Per PCP.  6. 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  Site looks benign. - He is on long-term  Keflex for MSSA suppression.  Followup with ID.  8. 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 No overt bleeding. - CBC today.  - Off ASA, INR goal now 1.8-2.3.  - Continue monthly octreotide injections.   Followup in 2 months    I spent 41 minutes reviewing chart, interacting with patient, reviewed LVAD parameters, and managing orders.   Marca Ancona 12/18/2023

## 2023-12-17 NOTE — Patient Instructions (Signed)
No change in medications Coumadin dosing by [place] Welch Return to clinic in 2 months with complete echo

## 2023-12-23 ENCOUNTER — Ambulatory Visit (HOSPITAL_COMMUNITY): Payer: Self-pay | Admitting: Pharmacist

## 2023-12-23 LAB — POCT INR: INR: 1.9 — AB (ref 2.0–3.0)

## 2023-12-30 ENCOUNTER — Ambulatory Visit (HOSPITAL_COMMUNITY): Payer: Self-pay | Admitting: Pharmacist

## 2023-12-30 LAB — POCT INR: INR: 2 (ref 2.0–3.0)

## 2024-01-06 ENCOUNTER — Ambulatory Visit (HOSPITAL_COMMUNITY): Payer: Self-pay | Admitting: Pharmacist

## 2024-01-06 LAB — POCT INR: INR: 1.9 — AB (ref 2.0–3.0)

## 2024-01-09 ENCOUNTER — Ambulatory Visit (HOSPITAL_COMMUNITY)
Admission: RE | Admit: 2024-01-09 | Discharge: 2024-01-09 | Disposition: A | Discharge status: Home / Self Care | Payer: 59 | Source: Ambulatory Visit | Attending: Cardiology | Admitting: Cardiology

## 2024-01-09 DIAGNOSIS — K31811 Angiodysplasia of stomach and duodenum with bleeding: Secondary | ICD-10-CM | POA: Diagnosis present

## 2024-01-09 DIAGNOSIS — Z95811 Presence of heart assist device: Secondary | ICD-10-CM | POA: Insufficient documentation

## 2024-01-09 MED ORDER — OCTREOTIDE ACETATE 20 MG IM KIT
20.0000 mg | PACK | INTRAMUSCULAR | Status: DC
  Administered 2024-01-09: 20 mg via INTRAMUSCULAR

## 2024-01-09 MED ORDER — OCTREOTIDE ACETATE 20 MG IM KIT
PACK | INTRAMUSCULAR | Status: AC
  Filled 2024-01-09: qty 1

## 2024-01-14 ENCOUNTER — Ambulatory Visit (HOSPITAL_COMMUNITY): Payer: Self-pay | Admitting: Pharmacist

## 2024-01-14 LAB — POCT INR: INR: 2.1 (ref 2.0–3.0)

## 2024-01-20 ENCOUNTER — Ambulatory Visit (HOSPITAL_COMMUNITY): Payer: Self-pay | Admitting: Pharmacist

## 2024-01-20 ENCOUNTER — Other Ambulatory Visit (HOSPITAL_COMMUNITY): Payer: Self-pay | Admitting: *Deleted

## 2024-01-20 ENCOUNTER — Other Ambulatory Visit (HOSPITAL_COMMUNITY): Payer: Self-pay | Admitting: Cardiology

## 2024-01-20 DIAGNOSIS — Z95811 Presence of heart assist device: Secondary | ICD-10-CM

## 2024-01-20 DIAGNOSIS — J449 Chronic obstructive pulmonary disease, unspecified: Secondary | ICD-10-CM

## 2024-01-20 DIAGNOSIS — G4709 Other insomnia: Secondary | ICD-10-CM

## 2024-01-20 LAB — POCT INR: INR: 1.9 — AB (ref 2.0–3.0)

## 2024-01-20 MED ORDER — IPRATROPIUM-ALBUTEROL 20-100 MCG/ACT IN AERS: 1.0000 | INHALATION_SPRAY | Freq: Four times a day (QID) | RESPIRATORY_TRACT | 5 refills | Status: DC | PRN

## 2024-01-20 MED ORDER — ZOLPIDEM TARTRATE 5 MG PO TABS: 5.0000 mg | ORAL_TABLET | Freq: Every evening | ORAL | 5 refills | Status: DC | PRN

## 2024-01-28 ENCOUNTER — Ambulatory Visit (HOSPITAL_COMMUNITY): Payer: Self-pay | Admitting: Pharmacist

## 2024-01-28 LAB — POCT INR: INR: 2.1 (ref 2.0–3.0)

## 2024-02-02 ENCOUNTER — Ambulatory Visit (HOSPITAL_COMMUNITY): Payer: Self-pay | Admitting: Pharmacist

## 2024-02-02 LAB — POCT INR: INR: 1.9 — AB (ref 2.0–3.0)

## 2024-02-06 ENCOUNTER — Other Ambulatory Visit (HOSPITAL_COMMUNITY): Payer: Self-pay | Admitting: *Deleted

## 2024-02-06 ENCOUNTER — Ambulatory Visit (HOSPITAL_COMMUNITY)
Admission: RE | Admit: 2024-02-06 | Discharge: 2024-02-06 | Disposition: A | Discharge status: Home / Self Care | Payer: 59 | Source: Ambulatory Visit | Attending: Cardiology | Admitting: Cardiology

## 2024-02-06 DIAGNOSIS — Z95811 Presence of heart assist device: Secondary | ICD-10-CM | POA: Diagnosis present

## 2024-02-06 DIAGNOSIS — I5022 Chronic systolic (congestive) heart failure: Secondary | ICD-10-CM

## 2024-02-06 DIAGNOSIS — Z7901 Long term (current) use of anticoagulants: Secondary | ICD-10-CM

## 2024-02-06 DIAGNOSIS — K31811 Angiodysplasia of stomach and duodenum with bleeding: Secondary | ICD-10-CM

## 2024-02-06 MED ORDER — OCTREOTIDE ACETATE 20 MG IM KIT
PACK | INTRAMUSCULAR | Status: AC
Start: 2024-02-06 — End: 2024-02-06
  Administered 2024-02-06: 20 mg via INTRAMUSCULAR
  Filled 2024-02-06: qty 1

## 2024-02-06 MED ORDER — OCTREOTIDE ACETATE 20 MG IM KIT: 20.0000 mg | PACK | INTRAMUSCULAR | Status: DC

## 2024-02-09 ENCOUNTER — Other Ambulatory Visit (HOSPITAL_COMMUNITY): Payer: Self-pay

## 2024-02-09 DIAGNOSIS — Z7901 Long term (current) use of anticoagulants: Secondary | ICD-10-CM

## 2024-02-09 DIAGNOSIS — Z95811 Presence of heart assist device: Secondary | ICD-10-CM

## 2024-02-10 ENCOUNTER — Ambulatory Visit (HOSPITAL_COMMUNITY)
Admission: RE | Admit: 2024-02-10 | Discharge: 2024-02-10 | Disposition: A | Discharge status: Home / Self Care | Payer: 59 | Source: Ambulatory Visit | Attending: Cardiology | Admitting: Cardiology

## 2024-02-10 ENCOUNTER — Ambulatory Visit (HOSPITAL_COMMUNITY): Payer: Self-pay | Admitting: Pharmacist

## 2024-02-10 ENCOUNTER — Ambulatory Visit (HOSPITAL_BASED_OUTPATIENT_CLINIC_OR_DEPARTMENT_OTHER)
Admission: RE | Admit: 2024-02-10 | Discharge: 2024-02-10 | Disposition: A | Discharge status: Home / Self Care | Payer: 59 | Source: Ambulatory Visit | Attending: Cardiology | Admitting: Cardiology

## 2024-02-10 DIAGNOSIS — I110 Hypertensive heart disease with heart failure: Secondary | ICD-10-CM | POA: Diagnosis not present

## 2024-02-10 DIAGNOSIS — Z95811 Presence of heart assist device: Secondary | ICD-10-CM | POA: Diagnosis not present

## 2024-02-10 DIAGNOSIS — F17210 Nicotine dependence, cigarettes, uncomplicated: Secondary | ICD-10-CM | POA: Diagnosis not present

## 2024-02-10 DIAGNOSIS — I420 Dilated cardiomyopathy: Secondary | ICD-10-CM | POA: Insufficient documentation

## 2024-02-10 DIAGNOSIS — E785 Hyperlipidemia, unspecified: Secondary | ICD-10-CM | POA: Diagnosis not present

## 2024-02-10 DIAGNOSIS — Z7901 Long term (current) use of anticoagulants: Secondary | ICD-10-CM

## 2024-02-10 DIAGNOSIS — I5022 Chronic systolic (congestive) heart failure: Secondary | ICD-10-CM

## 2024-02-10 DIAGNOSIS — E119 Type 2 diabetes mellitus without complications: Secondary | ICD-10-CM | POA: Diagnosis not present

## 2024-02-10 DIAGNOSIS — Z86718 Personal history of other venous thrombosis and embolism: Secondary | ICD-10-CM | POA: Insufficient documentation

## 2024-02-10 DIAGNOSIS — J449 Chronic obstructive pulmonary disease, unspecified: Secondary | ICD-10-CM | POA: Insufficient documentation

## 2024-02-10 DIAGNOSIS — K7030 Alcoholic cirrhosis of liver without ascites: Secondary | ICD-10-CM | POA: Insufficient documentation

## 2024-02-10 DIAGNOSIS — G4733 Obstructive sleep apnea (adult) (pediatric): Secondary | ICD-10-CM | POA: Insufficient documentation

## 2024-02-10 DIAGNOSIS — Z79899 Other long term (current) drug therapy: Secondary | ICD-10-CM | POA: Diagnosis not present

## 2024-02-10 LAB — CBC
HCT: 42.5 % (ref 39.0–52.0)
Hemoglobin: 14.1 g/dL (ref 13.0–17.0)
MCH: 28.7 pg (ref 26.0–34.0)
MCHC: 33.2 g/dL (ref 30.0–36.0)
MCV: 86.4 fL (ref 80.0–100.0)
Platelets: 251 10*3/uL (ref 150–400)
RBC: 4.92 MIL/uL (ref 4.22–5.81)
RDW: 14 % (ref 11.5–15.5)
WBC: 10.9 10*3/uL — ABNORMAL HIGH (ref 4.0–10.5)
nRBC: 0 % (ref 0.0–0.2)

## 2024-02-10 LAB — COMPREHENSIVE METABOLIC PANEL
ALT: 20 U/L (ref 0–44)
AST: 22 U/L (ref 15–41)
Albumin: 4.2 g/dL (ref 3.5–5.0)
Alkaline Phosphatase: 98 U/L (ref 38–126)
Anion gap: 12 (ref 5–15)
BUN: 19 mg/dL (ref 8–23)
CO2: 20 mmol/L — ABNORMAL LOW (ref 22–32)
Calcium: 9.5 mg/dL (ref 8.9–10.3)
Chloride: 107 mmol/L (ref 98–111)
Creatinine, Ser: 1.4 mg/dL — ABNORMAL HIGH (ref 0.61–1.24)
GFR, Estimated: 56 mL/min — ABNORMAL LOW (ref >60)
Glucose, Bld: 151 mg/dL — ABNORMAL HIGH (ref 70–99)
Potassium: 4.3 mmol/L (ref 3.5–5.1)
Sodium: 139 mmol/L (ref 135–145)
Total Bilirubin: 1.2 mg/dL (ref 0.0–1.2)
Total Protein: 7.1 g/dL (ref 6.5–8.1)

## 2024-02-10 LAB — PROTIME-INR
INR: 1.3 — ABNORMAL HIGH (ref 0.8–1.2)
Prothrombin Time: 16.7 s — ABNORMAL HIGH (ref 11.4–15.2)

## 2024-02-10 LAB — ECHOCARDIOGRAM COMPLETE
Area-P 1/2: 4.6 cm2
S' Lateral: 3.7 cm

## 2024-02-10 LAB — LACTATE DEHYDROGENASE: LDH: 214 U/L — ABNORMAL HIGH (ref 98–192)

## 2024-02-10 LAB — MAGNESIUM: Magnesium: 2.2 mg/dL (ref 1.7–2.4)

## 2024-02-10 LAB — PREALBUMIN: Prealbumin: 25 mg/dL (ref 18–38)

## 2024-02-10 NOTE — Progress Notes (Signed)
  Echocardiogram 2D Echocardiogram has been performed.  Ocie Doyne RDCS 02/10/2024, 9:41 AM

## 2024-02-10 NOTE — Patient Instructions (Signed)
 No medication changes today Follow up in 2 months Coumadin dosing per [name] [facility]

## 2024-02-10 NOTE — Progress Notes (Addendum)
 Patient presents for 2 mo f/u and 5.5 year Intermacs in VAD Clinic today with alone. Reports no problems with VAD equipment or concerns with drive line.  Pt reports he has been feeling good.  Denies lightheadedness, dizziness, falls, heart failure symptoms, and signs of bleeding. Pt endorses good appetite. He is remaining active helping with his grandkids and fishing.  [name] is changing his drive line dressing weekly using daily kit. Pt reports no issues with drive line. Remains on Keflex TID per ID.   Receiving monthly Octreotide injections.   Smoking cessation encouraged by Dr. Shirlee Latch.  Vital Signs: Doppler Pressure: 100 Automatic BP: 93/77 (82) HR: 88 SR SPO2: 98% RA   Weight: 194.2 lbs w/o equipment Last weight: 194 lbs w/o equipment  VAD Indication: Destination Therapy due to smoking status   VAD interrogation & Equipment Management: Speed: 5800 Flow: 4.8 Power: 4.7 w    PI: 5.4 Hct: 42   Alarms: none Events: 40-50 PI Events  Fixed speed: 5800 Low speed limit: 5500  Primary Controller:   Replace back up battery in 29 months. Back up controller:   Replace back up battery in 16 months.    Annual Equipment Maintenance on UBC/PM was performed on 08/11/23.  I reviewed the LVAD parameters from today and compared the results to the patient's prior recorded data. LVAD interrogation was NEGATIVE for significant power changes, NEGATIVE for clinical alarms and NEGATIVE for PI events/speed drops. No programming changes were made and pump is functioning within specified parameters. Pt is performing daily controller and system monitor self tests along with completing weekly and monthly maintenance for LVAD equipment.  LVAD equipment check completed and is in good working order. Back-up equipment present.   Exit Site Care: Drive line is being maintained weekly by [name]. Dressing CDI. Existing VAD dressing CDI. Pt reports no redness, tenderness, drainage, foul odor or rash noted.  Pt given 7 daily kits, adhesive removed and 7 anchors for home use.  Device: N/A   BP & Labs:  Doppler 100 - correlates with modified systolic   Hgb 14.1 -No S/S of bleeding. Specifically denies BRBPR.   LDH stable at 214 with established baseline of 150 - 220. Denies tea-colored urine. No power elevations noted on interrogation.   Patient Instructions: No change in medications Coumadin dosing by [facility] Return to clinic in 2 months   Simmie Davies RN,BSN VAD Coordinator  Office: [phone]  24/7 Pager: [phone]   Chief complaint: LVAD followup  63 y.o. with history of nonischemic cardiomyopathy, RLE DVT, cirrhosis, smoking, and OSA returns for followup of CHF/LVAD placement.  Cardiomyopathy was diagnosed in 6/19 in [place], [place] at that time showed low output.  He was admitted to [facility] in 9/19 with low output HF and was started on milrinone and diuresed.  Unable to wean off milrinone.  He had a degree of RV failure, but this improved on milrinone.  Valvular heart disease also looked better with milrinone and diuresis.  On 08/03/18, he had Heartmate 3 LVAD placed.  Speed was optimized by ramp echo post-op.  Post-op course was relatively unremarkable.  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  No abscess on CT.  He was started on cefazolin IV for 6 wks.  BP-active meds decreased with low MAP.   He was admitted in 3/21 with subxiphoid abscess and cellulitis.  CT showed collection along the course of the driveline.  There was no evidence for sternal osteomyelitis.  Abscess was debrided, MSSA grew from wound cultures.  Wound vac was placed.  Patient was started on cefazolin, which was recently stopped after completing 8 wks.   Patient was re-admitted later in 3/21 with facial Zoster.  He was treated with acyclovir and this has resolved.   He was admitted in 6/21 with bleeding from the site  of the subxiphoid abscess as well as cellulitis.  He required multiple units of PRBCs.  He went to the OR initially for I&D.  Blood and wound cultures grew MSSA.  He then went back to the OR for rectus flap over wound site (plastics).  He was started on cefazolin and rifabutin, has PICC.   He was admitted in 7/21 with GI bleeding, he had 6 units PRBCs.  EGD showed duodenal AVMs, treated with APC.  INR goal with warfarin lowered to 1.8-2.3.   Echo in 5/22 was a difficult study with EF 25-30%, mild LVH, aortic valve difficult to visualize but appears to open with each beat, mildly dilated RV with moderately decreased systolic function.  Echo in 7/23 was a very difficult study.  Echo in 5/24 showed EF 20-25% with midline interventricular septum, RV moderately dilated with moderately decreased systolic function, IVC normal. Echo was done today and reviewed, technically difficult study, interventricular septum was poorly visualized but probably midline, RV not seen.   He returns for LVAD followup.   Still smoking a few cigarettes/day.  Weight is stable. MAP is controlled. LVAD parameters stable with a few PIs daily. No exertional dyspnea.  No lightheadedness.  No orthopnea/PND.  Overall, feels quite good.    Denies LVAD alarms.  Reports taking Coumadin as prescribed and adherence to anticoagulation based dietary restrictions.  Denies bright red blood per rectum or melena, no dark urine or hematuria.  Gets monthly octreotide.   Labs (9/19): LDH 210, INR 2.17, WBCs 17.1 => 15, hgb 9.7, creatinine 0.72 Labs (10/19): K 4.3, creatinine 0.78 => 0.83, hgb 10.2 Labs (11/19): creatinine 0.79, hgb 10 Labs (2/20): K 3.9, creatinine 0.79 Labs (4/20): K 4, creatinine 1.15, hgb 11.4, LDH 199, INR 1.9 Labs (5/20): hgb 10.4, WBCs 8.5 Labs (6/20): K 4.2, creatinine 1.06, hgb 11.2 Labs (8/20): k 3.7, creatinine 1.0, hgb 11.5, LDH 186 Labs (10/20): K 3.5, creatinine 0.93, INR 2, LDH 221 Labs (12/20): hgb 15.6, K 3.7,  creatinine 1.02, LDH 250 Labs (4/21): K 4.1, creatinine 0.86, hgb 11.1 Labs (6/21): K 4.1, creatinine 0.76, hgb 10, WBCs 11.2 Labs (8/21): hgb 10.6 Labs (10/21): hgb 13.2, creatinine 1.17 Labs (7/22): hgb 8, LDH 372, INR 1.9, creatinine 0.95 Labs (8/22): hgb 12.6 Labs (9/22): K 3.9, creatinine 1.1 Labs (11/22): creatinine 1.01 Labs (5/23): K 4.4, creatinine 0.72, hgb 13.3, LDH 214 Labs (7/23): K 3.8, creatinine 0.96 Labs (11/23): K 3.9, creatinine 1.02 Labs (3/24): LDL 219, hgb 13.4, K 5.4, creatinine 1.54 => 1.43 Labs (5/24): hgb 13 Labs (6/24): K 3.7, creatinine 0.99 Labs (10/24): K 4.4, creatinine 1.3 Labs (11/24): hgb 14, K 4.4, creatinine 1.41  PMH: 1. Degenerative disc disease.  2. GERD 3. Chronic systolic CHF:  Nonischemic cardiomyopathy.  Dilated cardiomyopathy diagnosed 6/19 in [place].  LHC/RHC in 7/19 showed elevated filling pressures, low cardiac output, and no significant CAD.   - Echo (9/19): Severe LV dilation with EF 10-20%, moderate-severe MR, severely dilated RV with mildly decreased systolic function, severe TR.  - Cardiac MRI (9/19): EF 14%, moderate dilated LV, severely dilated RV with  mod-severe systolic function and EF 25%, nonspecific RV insertion site LGE.  - RHC (5/19) on milrinone 0.375: mean RA 8, PA 40/19, mean PCWP 12, PAPi 2.65, CI 2.71.  - Echo (9/19) on milrinone and diuresed): EF 20-25% with moderate LV dilation, moderately dilated RV with mildly decreased systolic function, mild-moderate MR, moderate TR, PASP 51 mmHg.  Cannot rule out noncompaction.  - Heartmate 3 LVAD placement in 9/19.  - Echo (5/22): EF 25-30%, mild LVH, aortic valve difficult to visualize but appears to open with each beat, mildly dilated RV with moderately decreased systolic function. - Echo (5/24): EF 20-25% with midline interventricular septum, RV moderately dilated with moderately decreased systolic function, IVC normal.  - Echo (3/25): Technically difficult, RV not visualized,  interventricular septum was probably midline.  4. OSA: CPAP use.  5. Prior smoker 6. Type 2 diabetes 7. Hyperlipidemia.  8. H/o NSVT 9. 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  10. RLE DVT: found in 9/19.  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 Subxiphoid abscess involving collection along driveline in 3/21, MSSA.  6/21 Rectus flap to cover abscess site.  12. Facial Zoster 3/21 13. 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   Current Outpatient Medications  Medication Sig Dispense Refill   acetaminophen (TYLENOL) 500 MG tablet Take 1,000 mg by mouth every 6 (six) hours as needed for mild pain.     carbamide peroxide (DEBROX) 6.5 % OTIC solution Place 5 drops into both ears 3 (three) times daily. 15 mL 0   cephALEXin (KEFLEX) 500 MG capsule Take 1 capsule (500 mg total) by mouth 3 (three) times daily. 90 capsule 5   cyanocobalamin 500 MCG tablet Take 1,000 mcg by mouth daily.      docusate sodium (COLACE) 100 MG capsule Take 2 capsules (200 mg total) by mouth daily as needed for mild constipation. 10 capsule 0   fluticasone (FLONASE) 50 MCG/ACT nasal spray Place 1 spray into both nostrils 2 (two) times daily as needed for allergies. 9.9 mL 5   gabapentin (NEURONTIN) 300 MG capsule TAKE 2 CAPSULES BY MOUTH 4 TIMES DAILY 240 capsule 12   hydrALAZINE (APRESOLINE) 100 MG tablet Take 1 tablet (100 mg total) by mouth 3 (three) times daily. 270 tablet 3   hydrOXYzine (ATARAX) 50 MG tablet Take 1 tablet (50 mg total) by mouth 3 (three) times daily as needed for itching. 60 tablet 3   Ipratropium-Albuterol (COMBIVENT) 20-100 MCG/ACT AERS respimat Inhale 1 puff into the lungs every 6 (six) hours as needed for wheezing or shortness of breath. 4 g 5   nystatin powder Apply 1 Application topically 3 (three) times daily. 15 g 11   pantoprazole (PROTONIX) 40 MG tablet Take 1 tablet by mouth once daily 90 tablet 0    sacubitril-valsartan (ENTRESTO) 49-51 MG Take 1 tablet by mouth 2 (two) times daily. 180 tablet 3   sildenafil (VIAGRA) 100 MG tablet Take 0.5 tablets (50 mg total) by mouth daily as needed for erectile dysfunction. 10 tablet 6   spironolactone (ALDACTONE) 25 MG tablet Take 1 tablet (25 mg total) by mouth daily. 90 tablet 3   thiamine 100 MG tablet Take 1 tablet (100 mg total) by mouth daily. 30 tablet 0   warfarin (COUMADIN) 5 MG tablet TAKE 0.5 (ONE-HALF) TABLET BY MOUTH EVERY TUESDAY AND ONE TABLET ONCE DAILY ON ALL OTHER DAYS OR AS INSTRUCTED BY HF CLINIC 30 tablet 8   Zinc Gluconate 100  MG TABS Take 1 tablet (100 mg total) by mouth daily. 90 tablet 3   zolpidem (AMBIEN) 5 MG tablet Take 1 tablet (5 mg total) by mouth at bedtime as needed for sleep. 30 tablet 5   amLODipine (NORVASC) 5 MG tablet Take 2 tablets (10 mg total) by mouth daily. 180 tablet 3   No current facility-administered medications for this encounter.    Chlorhexidine, Atorvastatin, and Other  REVIEW OF SYSTEMS: All systems negative except as listed in HPI, PMH and Problem list.   LVAD INTERROGATION:  Please see LVAD nurse's note above for full interrogation.  I reviewed with nurse.   I reviewed the LVAD parameters from today, and compared the results to the patient's prior recorded data.  No programming changes were made.  The LVAD is functioning within specified parameters.  The patient performs LVAD self-test daily.  LVAD interrogation was negative for any significant power changes, alarms or PI events/speed drops.  LVAD equipment check completed and is in good working order.  Back-up equipment present.   LVAD education done on emergency procedures and precautions and reviewed exit site care.   BP (!) 100/0   Pulse 88   Wt 88.1 kg (194 lb 3.2 oz)   SpO2 98%   BMI 27.09 kg/m  MAP 80  Physical Exam: General: Well appearing this am. NAD.  HEENT: Normal. Neck: Supple, JVP 7-8 cm. Carotids OK.  Cardiac:   Mechanical heart sounds with LVAD hum present.  Lungs:  CTAB, normal effort.  Abdomen:  NT, ND, no HSM. No bruits or masses. +BS  LVAD exit site: Well-healed and incorporated. Dressing dry and intact. No erythema or drainage. Stabilization device present and accurately applied. Driveline dressing changed daily per sterile technique. Extremities:  Warm and dry. No cyanosis, clubbing, rash, or edema.  Neuro:  Alert & oriented x 3. Cranial nerves grossly intact. Moves all 4 extremities w/o difficulty. Affect pleasant      ASSESSMENT AND PLAN:   1. 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  Echo today was difficult to interpret, but probably midline interventricular septum, no speed change. LVAD parameters reviewed and stable with a few PI events daily.  NYHA class I. MAP controlled, not volume overloaded on exam.    - BMET, LDH today.   - Continue spironolactone 25 mg daily.  - Continue Entresto 49/51 bid    - Continue hydralazine 100 mg tid.  - Off Coreg with bradycardia and RV dysfunction by prior echo (RV not visualized today).  - Continue amlodipine 10 mg daily.  - He is off ASA with GI bleeding.  - Continue warfarin for INR 1.8-2.3.    - Would be transplant candidate eventually if he can quit smoking.  Will make transplant clinic referral when he is off totally.  2. Smoking: Still smoking.  He tried Chantix and Wellbutrin but they did not work.  We discussed cessation.  3. RLE DVT: On warfarin for LVAD. INR sent today.  4. OSA: Continue CPAP.   5. Type II diabetes: Per PCP.  6. 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  Site looks benign. - He is on long-term Keflex for MSSA suppression.  Followup with ID.  8. 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 No overt bleeding. - CBC today.  - Off ASA, INR goal now 1.8-2.3.  - Continue monthly octreotide injections.    Followup in  2 months    I spent 42 minutes reviewing chart, interacting with patient, reviewed LVAD parameters, and managing orders.   Marca Ancona 02/10/2024

## 2024-02-13 ENCOUNTER — Encounter: Payer: Self-pay | Admitting: Neurology

## 2024-02-17 ENCOUNTER — Ambulatory Visit (HOSPITAL_COMMUNITY): Payer: Self-pay | Admitting: Pharmacist

## 2024-02-17 LAB — POCT INR: INR: 2.1 (ref 2.0–3.0)

## 2024-02-24 ENCOUNTER — Ambulatory Visit (HOSPITAL_COMMUNITY): Payer: Self-pay | Admitting: Pharmacist

## 2024-02-24 LAB — POCT INR: INR: 1.9 — AB (ref 2.0–3.0)

## 2024-03-02 ENCOUNTER — Ambulatory Visit (HOSPITAL_COMMUNITY): Payer: Self-pay | Admitting: Pharmacist

## 2024-03-02 LAB — POCT INR: INR: 2.1 (ref 2.0–3.0)

## 2024-03-05 ENCOUNTER — Encounter (HOSPITAL_COMMUNITY)

## 2024-03-08 ENCOUNTER — Encounter (HOSPITAL_COMMUNITY)
Admission: RE | Admit: 2024-03-08 | Discharge: 2024-03-08 | Disposition: A | Discharge status: Home / Self Care | Source: Ambulatory Visit | Attending: Cardiology | Admitting: Cardiology

## 2024-03-08 DIAGNOSIS — Z95811 Presence of heart assist device: Secondary | ICD-10-CM | POA: Diagnosis present

## 2024-03-08 DIAGNOSIS — Z7901 Long term (current) use of anticoagulants: Secondary | ICD-10-CM | POA: Insufficient documentation

## 2024-03-08 DIAGNOSIS — I5022 Chronic systolic (congestive) heart failure: Secondary | ICD-10-CM | POA: Diagnosis present

## 2024-03-08 DIAGNOSIS — K31811 Angiodysplasia of stomach and duodenum with bleeding: Secondary | ICD-10-CM | POA: Insufficient documentation

## 2024-03-08 MED ORDER — OCTREOTIDE ACETATE 20 MG IM KIT
20.0000 mg | PACK | INTRAMUSCULAR | Status: DC
  Administered 2024-03-08: 20 mg via INTRAMUSCULAR

## 2024-03-09 ENCOUNTER — Ambulatory Visit (HOSPITAL_COMMUNITY): Payer: Self-pay | Admitting: Pharmacist

## 2024-03-09 LAB — POCT INR: INR: 2 (ref 2.0–3.0)

## 2024-03-16 ENCOUNTER — Ambulatory Visit (HOSPITAL_COMMUNITY): Payer: Self-pay | Admitting: Pharmacist

## 2024-03-16 LAB — POCT INR: INR: 1.9 — AB (ref 2.0–3.0)

## 2024-03-23 ENCOUNTER — Other Ambulatory Visit: Payer: Self-pay

## 2024-03-23 ENCOUNTER — Encounter: Payer: Self-pay | Admitting: Infectious Diseases

## 2024-03-23 ENCOUNTER — Ambulatory Visit: Payer: Medicare HMO | Admitting: Infectious Diseases

## 2024-03-23 ENCOUNTER — Ambulatory Visit (HOSPITAL_COMMUNITY): Payer: Self-pay | Admitting: Pharmacist

## 2024-03-23 DIAGNOSIS — Z8619 Personal history of other infectious and parasitic diseases: Secondary | ICD-10-CM

## 2024-03-23 DIAGNOSIS — Z95811 Presence of heart assist device: Secondary | ICD-10-CM | POA: Diagnosis not present

## 2024-03-23 LAB — POCT INR: INR: 2.1 (ref 2.0–3.0)

## 2024-03-23 MED ORDER — CEPHALEXIN 500 MG PO CAPS: 500.0000 mg | ORAL_CAPSULE | Freq: Three times a day (TID) | ORAL | 3 refills | Status: DC

## 2024-03-23 NOTE — Progress Notes (Addendum)
 Patient: Theodore Welch  DOB: 1960-11-27 MRN: [id number] PCP: [facility]    ID Hx: Theodore Welch is a 64 y.o. 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   January-2020: MSSA infection of counter incision to mid abdomen; s/p serial debridements. Initially did not involve exit site at this time. Received IV Cefazolin  x 4 weeks --> PO Cephalexin  x 4 weeks.  May-2020: recurrent MSSA infection, now involving driveline exit site and secondary bacteremia. TEE deferred due to low sensitivity for detecting VAD endocarditis. 6 weeks IV Cefazolin  s/p abd wall debridement --> cephalexin  for chronic suppression for duration pump is in place.   March-2021 sudden onset subxiphoid abscess involving proximal driveline, MSSA on cultures. Blood cultures negative. S/P I&D of abdominal wall exit site and subxiphoid space. IV cefazolin  x 8 weeks (May 10th 2021).  (to cover for possible sternal osteomyelitis concern) with plans for resuming chronic suppression with cephalexin .   Apr 16, 2020 - readmitted following re-opening and bleeding of subxiphoid wound. +MSSA and taken for debridement 6/01. Required repair of outflow graft at this surgery as well (?sharp puncture vs degenerated graft d/t infection). Vertical Rectus Flap repair performed with serial debridements. BC (-). PICC Cefazolin  + Rifabutin  planned through 7/7  April 27, 2020 - bleeding from sternum r/t subxiphoid abscess --> debridement in OR, Cx again grew out MSSA. Back on IV cefazolin  for treatment.   March 2022 - enterobacter cloacae growing from non-healing soft tissue wound. Added 2 weeks cipro  to treat --> Resolved. Has continued on cephalexin .     Subjective   Chief Complaint  Patient presents with   Follow-up    Discussed the use of AI scribe software for clinical note transcription with the patient, who gave  verbal consent to proceed.  Discussed the use of AI scribe software for clinical note transcription with the patient, who gave verbal consent to proceed.  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   He is managing a chronic infection with Keflex , taken three times daily, though he occasionally misses a dose. He ensures to take it at least twice daily, once in the morning and once in the evening, alongside his other medications, including warfarin.  He describes his exit site as stable, with no new symptoms and no recent bleeding. Still maintained on iron and octreotide  for GIB treatments.   He has an upcoming appointment on May 21st for further routine care for LVAD.        Review of Systems  Constitutional:  Negative for chills and fever.  HENT:  Negative for tinnitus.   Eyes:  Negative for blurred vision and photophobia.  Respiratory:  Negative for cough and sputum production.   Cardiovascular:  Negative for chest pain.  Gastrointestinal:  Negative for diarrhea, nausea and vomiting.  Genitourinary:  Negative for dysuria.  Skin:  Negative for rash.  Neurological:  Negative for headaches.      Past Medical History:  Diagnosis Date   Abscess 01/2020   sternal abscess   Cardiomyopathy, unspecified (HCC)    CHF (congestive heart failure) (HCC)    Chronic back pain    Diabetes mellitus without complication (HCC)    Enlarged heart    Gastric ulcer    Gastroenteritis  H/O degenerative disc disease    Hypertension    LVAD (left ventricular assist device) present [facility])     Outpatient Medications Prior to Visit  Medication Sig Dispense Refill   acetaminophen  (TYLENOL ) 500 MG tablet Take 1,000 mg by mouth every 6 (six) hours as needed for mild pain.     carbamide peroxide (DEBROX) 6.5 % OTIC solution Place 5 drops into both ears 3 (three) times daily. 15  mL 0   cyanocobalamin  500 MCG tablet Take 1,000 mcg by mouth daily.      docusate sodium  (COLACE) 100 MG capsule Take 2 capsules (200 mg total) by mouth daily as needed for mild constipation. 10 capsule 0   fluticasone  (FLONASE ) 50 MCG/ACT nasal spray Place 1 spray into both nostrils 2 (two) times daily as needed for allergies. 9.9 mL 5   gabapentin  (NEURONTIN ) 300 MG capsule TAKE 2 CAPSULES BY MOUTH 4 TIMES DAILY 240 capsule 12   hydrALAZINE  (APRESOLINE ) 100 MG tablet Take 1 tablet (100 mg total) by mouth 3 (three) times daily. 270 tablet 3   hydrOXYzine  (ATARAX ) 50 MG tablet Take 1 tablet (50 mg total) by mouth 3 (three) times daily as needed for itching. 60 tablet 3   Ipratropium-Albuterol  (COMBIVENT ) 20-100 MCG/ACT AERS respimat Inhale 1 puff into the lungs every 6 (six) hours as needed for wheezing or shortness of breath. 4 g 5   nystatin  powder Apply 1 Application topically 3 (three) times daily. 15 g 11   pantoprazole  (PROTONIX ) 40 MG tablet Take 1 tablet by mouth once daily 90 tablet 0   sacubitril -valsartan  (ENTRESTO ) 49-51 MG Take 1 tablet by mouth 2 (two) times daily. 180 tablet 3   sildenafil  (VIAGRA ) 100 MG tablet Take 0.5 tablets (50 mg total) by mouth daily as needed for erectile dysfunction. 10 tablet 6   spironolactone  (ALDACTONE ) 25 MG tablet Take 1 tablet (25 mg total) by mouth daily. 90 tablet 3   thiamine  100 MG tablet Take 1 tablet (100 mg total) by mouth daily. 30 tablet 0   warfarin (COUMADIN ) 5 MG tablet TAKE 0.5 (ONE-HALF) TABLET BY MOUTH EVERY TUESDAY AND ONE TABLET ONCE DAILY ON ALL OTHER DAYS OR AS INSTRUCTED BY HF CLINIC 30 tablet 8   Zinc  Gluconate 100 MG TABS Take 1 tablet (100 mg total) by mouth daily. 90 tablet 3   zolpidem  (AMBIEN ) 5 MG tablet Take 1 tablet (5 mg total) by mouth at bedtime as needed for sleep. 30 tablet 5   cephALEXin  (KEFLEX ) 500 MG capsule Take 1 capsule (500 mg total) by mouth 3 (three) times daily. 90 capsule 5   amLODipine  (NORVASC ) 5 MG  tablet Take 2 tablets (10 mg total) by mouth daily. 180 tablet 3   No facility-administered medications prior to visit.     Allergies  Allergen Reactions   Chlorhexidine  Rash    Blisters   Atorvastatin  Other (See Comments)   Other Rash    Prep pads      Objective   Objective:   Vitals:   03/23/24 0940  Weight: 203 lb (92.1 kg)  Height: 5\' 11"  (1.803 m)     Body mass index is 28.31 kg/m.   Physical Exam Vitals reviewed.  Constitutional:      Appearance: Normal appearance. He is not ill-appearing.  Eyes:     General: No scleral icterus.    Pupils: Pupils are equal, round, and reactive to light.  Cardiovascular:     Rate and Rhythm: Normal rate.  Comments: LVAD Humm +  Pulmonary:     Effort: Pulmonary effort is normal.  Abdominal:     Palpations: Abdomen is soft.     Tenderness: There is no abdominal tenderness.     Comments: Driveline dressing clean and dry. Other abdominal incisions completely healed over and open to air.   Skin:    General: Skin is warm and dry.     Capillary Refill: Capillary refill takes less than 2 seconds.  Neurological:     Mental Status: He is alert and oriented to person, place, and time.      Lab Results: Lab Results  Component Value Date   WBC 10.9 (H) 02/10/2024   HGB 14.1 02/10/2024   HCT 42.5 02/10/2024   MCV 86.4 02/10/2024   PLT 251 02/10/2024    Lab Results  Component Value Date   CREATININE 1.40 (H) 02/10/2024   BUN 19 02/10/2024   NA 139 02/10/2024   K 4.3 02/10/2024   CL 107 02/10/2024   CO2 20 (L) 02/10/2024    Lab Results  Component Value Date   ALT 20 02/10/2024   AST 22 02/10/2024   ALKPHOS 98 02/10/2024   BILITOT 1.2 02/10/2024    Sed Rate (mm/hr)  Date Value  05/28/2021 4  03/27/2021 1  01/16/2021 1   CRP (mg/dL)  Date Value  16/08/9603 0.6  03/27/2021 0.7  01/16/2021 0.7       Assessment & Plan:     LVAD Driveline Infection, MSSA -  Secondary Bacteremia, MSSA (2020) -   Sternal/Subxiphoid Abscess, MSSA (2021) -  Theodore Welch is now 5 years now from index infection and has been doing well on chronic suppressive keflex . No concerns discussed during today's visit - the infection appears quiescent. High risk of recurrence if antibiotics are discontinued due to the adhesive nature of the staph aureus. Maintains at least twice daily dosing of Keflex , occasionally missing a dose. - Continue Keflex  for chronic indefinite suppression  - Will ask LVAD team to get updated photo in chart at next visit later this month.  - Will request updated CMP, CBC as well for monitoring  - Refills provided for 1 year - He would like to check in on a 24m interval.   Visit duration: 15 minutes           No orders of the defined types were placed in this encounter.  Meds ordered this encounter  Medications   cephALEXin  (KEFLEX ) 500 MG capsule    Sig: Take 1 capsule (500 mg total) by mouth 3 (three) times daily.    Dispense:  270 capsule    Refill:  3    Supervising Provider:   Liane Redman [[id number]]   Return in about 6 months (around 09/23/2024).   Gibson Kurtz, MSN, NP-C [facility]  [email or web address] Pager: [phone] Office: [phone] [facility] Main Line: [phone]

## 2024-03-23 NOTE — Patient Instructions (Signed)
 No changes from me - get the antibioitic in at least 2x  a day.   Refills have been sent in for a year  I will ask VAD team to update the chart with a photo of your driveline site and make sure we get some updated lab work for you too.   Be well! Enjoy the summer!

## 2024-03-30 ENCOUNTER — Ambulatory Visit (HOSPITAL_COMMUNITY): Payer: Self-pay | Admitting: Pharmacist

## 2024-03-30 LAB — POCT INR: INR: 1.9 — AB (ref 2.0–3.0)

## 2024-04-05 ENCOUNTER — Encounter (HOSPITAL_COMMUNITY)
Admission: RE | Admit: 2024-04-05 | Discharge: 2024-04-05 | Disposition: A | Discharge status: Home / Self Care | Source: Ambulatory Visit | Attending: Cardiology | Admitting: Cardiology

## 2024-04-05 DIAGNOSIS — Z95811 Presence of heart assist device: Secondary | ICD-10-CM | POA: Diagnosis not present

## 2024-04-05 DIAGNOSIS — I5022 Chronic systolic (congestive) heart failure: Secondary | ICD-10-CM | POA: Diagnosis present

## 2024-04-05 DIAGNOSIS — K31811 Angiodysplasia of stomach and duodenum with bleeding: Secondary | ICD-10-CM

## 2024-04-05 DIAGNOSIS — Z7901 Long term (current) use of anticoagulants: Secondary | ICD-10-CM

## 2024-04-05 MED ORDER — OCTREOTIDE ACETATE 20 MG IM KIT
20.0000 mg | PACK | INTRAMUSCULAR | Status: DC
  Administered 2024-04-05: 20 mg via INTRAMUSCULAR

## 2024-04-05 MED ORDER — OCTREOTIDE ACETATE 20 MG IM KIT
PACK | INTRAMUSCULAR | Status: AC
  Filled 2024-04-05: qty 1

## 2024-04-06 ENCOUNTER — Other Ambulatory Visit (HOSPITAL_COMMUNITY): Payer: Self-pay | Admitting: Unknown Physician Specialty

## 2024-04-06 DIAGNOSIS — Z7901 Long term (current) use of anticoagulants: Secondary | ICD-10-CM

## 2024-04-06 DIAGNOSIS — Z95811 Presence of heart assist device: Secondary | ICD-10-CM

## 2024-04-07 ENCOUNTER — Ambulatory Visit (HOSPITAL_COMMUNITY): Payer: Self-pay | Admitting: Pharmacist

## 2024-04-07 ENCOUNTER — Ambulatory Visit (HOSPITAL_COMMUNITY)
Admission: RE | Admit: 2024-04-07 | Discharge: 2024-04-07 | Disposition: A | Discharge status: Home / Self Care | Source: Ambulatory Visit | Attending: Cardiology | Admitting: Cardiology

## 2024-04-07 ENCOUNTER — Ambulatory Visit (HOSPITAL_COMMUNITY): Payer: Self-pay | Admitting: Cardiology

## 2024-04-07 DIAGNOSIS — Z7901 Long term (current) use of anticoagulants: Secondary | ICD-10-CM | POA: Diagnosis not present

## 2024-04-07 DIAGNOSIS — Z4509 Encounter for adjustment and management of other cardiac device: Secondary | ICD-10-CM | POA: Diagnosis not present

## 2024-04-07 DIAGNOSIS — Z95811 Presence of heart assist device: Secondary | ICD-10-CM | POA: Diagnosis present

## 2024-04-07 LAB — COMPREHENSIVE METABOLIC PANEL WITH GFR
ALT: 19 U/L (ref 0–44)
AST: 22 U/L (ref 15–41)
Albumin: 3.9 g/dL (ref 3.5–5.0)
Alkaline Phosphatase: 83 U/L (ref 38–126)
Anion gap: 9 (ref 5–15)
BUN: 11 mg/dL (ref 8–23)
CO2: 23 mmol/L (ref 22–32)
Calcium: 9.1 mg/dL (ref 8.9–10.3)
Chloride: 107 mmol/L (ref 98–111)
Creatinine, Ser: 1.16 mg/dL (ref 0.61–1.24)
GFR, Estimated: >60 mL/min (ref >60)
Glucose, Bld: 128 mg/dL — ABNORMAL HIGH (ref 70–99)
Potassium: 3.8 mmol/L (ref 3.5–5.1)
Sodium: 139 mmol/L (ref 135–145)
Total Bilirubin: 0.6 mg/dL (ref 0.0–1.2)
Total Protein: 6.6 g/dL (ref 6.5–8.1)

## 2024-04-07 LAB — CBC
HCT: 39.4 % (ref 39.0–52.0)
Hemoglobin: 13.3 g/dL (ref 13.0–17.0)
MCH: 28.5 pg (ref 26.0–34.0)
MCHC: 33.8 g/dL (ref 30.0–36.0)
MCV: 84.4 fL (ref 80.0–100.0)
Platelets: 224 10*3/uL (ref 150–400)
RBC: 4.67 MIL/uL (ref 4.22–5.81)
RDW: 14 % (ref 11.5–15.5)
WBC: 7.7 10*3/uL (ref 4.0–10.5)
nRBC: 0 % (ref 0.0–0.2)

## 2024-04-07 LAB — PROTIME-INR
INR: 2.2 — ABNORMAL HIGH (ref 0.8–1.2)
Prothrombin Time: 24.6 s — ABNORMAL HIGH (ref 11.4–15.2)

## 2024-04-07 LAB — LACTATE DEHYDROGENASE: LDH: 194 U/L — ABNORMAL HIGH (ref 98–192)

## 2024-04-07 NOTE — Progress Notes (Addendum)
 Patient presents for 2 mo f/u in [facility] today with his wife Theodore Welch. Reports no problems with VAD equipment or concerns with drive line.  Pt reports he has been feeling good.  Denies lightheadedness, dizziness, falls, heart failure symptoms, and signs of bleeding. Pt endorses good appetite. He is remaining active helping with his grandkids and fishing.  Theodore Welch is changing his drive line dressing weekly using daily kit. Pt reports no issues with drive line. Remains on Keflex  TID per ID.   Receiving monthly Octreotide  injections.   Smoking cessation encouraged by Dr. Mitzie Anda.  Vital Signs: Doppler Pressure: 100 Automatic BP: 89/69 (76) HR: 72 SR SPO2: 99% RA   Weight: 196.8 lbs w/o equipment Last weight: 194.2 lbs w/o equipment  VAD Indication: Destination Therapy due to smoking status   VAD interrogation & Equipment Management: Speed: 5800 Flow: 4.8 Power: 4.8 w    PI: 8.5 Hct: 42   Alarms: none Events: 70-80 PI Events  Fixed speed: 5800 Low speed limit: 5500  Primary Controller:   Replace back up battery in 27 months. Back up controller:   Replace back up battery in 10 months.    Annual Equipment Maintenance on UBC/PM was performed on 08/11/23.  I reviewed the LVAD parameters from today and compared the results to the patient's prior recorded data. LVAD interrogation was NEGATIVE for significant power changes, NEGATIVE for clinical alarms and NEGATIVE for PI events/speed drops. No programming changes were made and pump is functioning within specified parameters. Pt is performing daily controller and system monitor self tests along with completing weekly and monthly maintenance for LVAD equipment.  LVAD equipment check completed and is in good working order. Back-up equipment present.   Exit Site Care: Drive line is being maintained weekly by [name]. Dressing CDI. Existing VAD dressing removed and site care performed using sterile technique. Drive line exit site cleaned  with Vashe x 2, allowed to dry, and gauze dressing with Vashe moistened 2 x 2 applied around driveline. Exit site healed and incorporated, the velour is fully implanted at exit site. No redness, tenderness, drainage, foul odor or rash noted. Small scab around driveline removed with cleaning. Drive line anchor re-applied. Pt denies fever or chills.  Pt given 28 daily kits, adhesive remover, alcohol wipes, large bottle of Vashe, and 12 anchors for home use.      Device: N/A   BP & Labs:  Doppler 100 - correlates with modified systolic   Hgb 13.3 -No S/S of bleeding. Specifically denies BRBPR.   LDH stable at 194 with established baseline of 150 - 220. Denies tea-colored urine. No power elevations noted on interrogation.   Patient Instructions: No change in medications Coumadin  dosing by Daron Ellen D Return to clinic in 2 months   Theodore Adams RN,BSN VAD Coordinator  Office: [phone]  24/7 Pager: [phone]   Chief complaint: LVAD followup  63 y.o. with history of nonischemic cardiomyopathy, RLE DVT, cirrhosis, smoking, and OSA returns for followup of CHF/LVAD placement.  Cardiomyopathy was diagnosed in 6/19 in [place], [place] at that time showed low output.  He was admitted to [facility] in 9/19 with low output HF and was started on milrinone  and diuresed.  Unable to wean off milrinone .  He had a degree of RV failure, but this improved on milrinone .  Valvular heart disease also looked better with milrinone  and diuresis.  On 08/03/18, he had Heartmate 3 LVAD placed.  Speed was optimized by ramp echo post-op.  Post-op course was relatively unremarkable.  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  No abscess on CT.  He was started on cefazolin  IV for 6 wks.  BP-active meds decreased with low MAP.   He was admitted in 3/21 with subxiphoid abscess and cellulitis.  CT showed collection along the course of  the driveline.  There was no evidence for sternal osteomyelitis.  Abscess was debrided, MSSA grew from wound cultures.  Wound vac was placed.  Patient was started on cefazolin , which was recently stopped after completing 8 wks.   Patient was re-admitted later in 3/21 with facial Zoster.  He was treated with acyclovir  and this has resolved.   He was admitted in 6/21 with bleeding from the site of the subxiphoid abscess as well as cellulitis.  He required multiple units of PRBCs.  He went to the OR initially for I&D.  Blood and wound cultures grew MSSA.  He then went back to the OR for rectus flap over wound site (plastics).  He was started on cefazolin  and rifabutin , has PICC.   He was admitted in 7/21 with GI bleeding, he had 6 units PRBCs.  EGD showed duodenal AVMs, treated with APC.  INR goal with warfarin lowered to 1.8-2.3.   Echo in 5/22 was a difficult study with EF 25-30%, mild LVH, aortic valve difficult to visualize but appears to open with each beat, mildly dilated RV with moderately decreased systolic function.  Echo in 7/23 was a very difficult study.  Echo in 5/24 showed EF 20-25% with midline interventricular septum, RV moderately dilated with moderately decreased systolic function, IVC normal. Echo in 3/25 was technically difficult study, interventricular septum was poorly visualized but probably midline, RV not seen.   He returns for LVAD followup.   Still smoking a few cigarettes/day.  Weight up 2 lbs.  MAP 76.  Feels good overall, dyspnea only with heavy exertion.  No lightheadedness.  Still with chronic frequent PI events on LVAD interrogation but no low flows.     Denies LVAD alarms.  Reports taking Coumadin  as prescribed and adherence to anticoagulation based dietary restrictions.  Denies bright red blood per rectum or melena, no dark urine or hematuria.  Gets monthly octreotide .   Labs (9/19): LDH 210, INR 2.17, WBCs 17.1 => 15, hgb 9.7, creatinine 0.72 Labs (10/19): K 4.3,  creatinine 0.78 => 0.83, hgb 10.2 Labs (11/19): creatinine 0.79, hgb 10 Labs (2/20): K 3.9, creatinine 0.79 Labs (4/20): K 4, creatinine 1.15, hgb 11.4, LDH 199, INR 1.9 Labs (5/20): hgb 10.4, WBCs 8.5 Labs (6/20): K 4.2, creatinine 1.06, hgb 11.2 Labs (8/20): k 3.7, creatinine 1.0, hgb 11.5, LDH 186 Labs (10/20): K 3.5, creatinine 0.93, INR 2, LDH 221 Labs (12/20): hgb 15.6, K 3.7, creatinine 1.02, LDH 250 Labs (4/21): K 4.1, creatinine 0.86, hgb 11.1 Labs (6/21): K 4.1, creatinine 0.76, hgb 10, WBCs 11.2 Labs (8/21): hgb 10.6 Labs (10/21): hgb 13.2, creatinine 1.17 Labs (7/22): hgb 8, LDH 372, INR 1.9, creatinine 0.95 Labs (8/22): hgb 12.6 Labs (9/22): K 3.9, creatinine 1.1 Labs (11/22): creatinine 1.01 Labs (5/23): K 4.4, creatinine 0.72, hgb 13.3, LDH 214 Labs (7/23): K 3.8, creatinine 0.96 Labs (11/23): K 3.9, creatinine 1.02 Labs (3/24): LDL 219, hgb 13.4, K 5.4, creatinine 1.54 => 1.43 Labs (5/24): hgb 13 Labs (6/24): K 3.7, creatinine 0.99 Labs (10/24): K 4.4, creatinine 1.3 Labs (11/24): hgb 14, K 4.4, creatinine 1.41 Labs (3/25): hgb 14.1,  K 4.3, creatinine 1.4, LFTs normal, LDH 214  PMH: 1. Degenerative disc disease.  2. GERD 3. Chronic systolic CHF:  Nonischemic cardiomyopathy.  Dilated cardiomyopathy diagnosed 6/19 in [place].  LHC/RHC in 7/19 showed elevated filling pressures, low cardiac output, and no significant CAD.   - Echo (9/19): Severe LV dilation with EF 10-20%, moderate-severe MR, severely dilated RV with mildly decreased systolic function, severe TR.  - Cardiac MRI (9/19): EF 14%, moderate dilated LV, severely dilated RV with mod-severe systolic function and EF 25%, nonspecific RV insertion site LGE.  - RHC (5/19) on milrinone  0.375: mean RA 8, PA 40/19, mean PCWP 12, PAPi 2.65, CI 2.71.  - Echo (9/19) on milrinone  and diuresed): EF 20-25% with moderate LV dilation, moderately dilated RV with mildly decreased systolic function, mild-moderate MR, moderate TR,  PASP 51 mmHg.  Cannot rule out noncompaction.  - Heartmate 3 LVAD placement in 9/19.  - Echo (5/22): EF 25-30%, mild LVH, aortic valve difficult to visualize but appears to open with each beat, mildly dilated RV with moderately decreased systolic function. - Echo (5/24): EF 20-25% with midline interventricular septum, RV moderately dilated with moderately decreased systolic function, IVC normal.  - Echo (3/25): Technically difficult, RV not visualized, interventricular septum was probably midline.  4. OSA: CPAP use.  5. Prior smoker 6. Type 2 diabetes 7. Hyperlipidemia.  8. H/o NSVT 9. 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  10. RLE DVT: found in 9/19.  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 Subxiphoid abscess involving collection along driveline in 3/21, MSSA.  6/21 Rectus flap to cover abscess site.  12. Facial Zoster 3/21 13. 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   Current Outpatient Medications  Medication Sig Dispense Refill   acetaminophen  (TYLENOL ) 500 MG tablet Take 1,000 mg by mouth every 6 (six) hours as needed for mild pain.     amLODipine  (NORVASC ) 5 MG tablet Take 2 tablets (10 mg total) by mouth daily. 180 tablet 3   carbamide peroxide (DEBROX) 6.5 % OTIC solution Place 5 drops into both ears 3 (three) times daily. 15 mL 0   cephALEXin  (KEFLEX ) 500 MG capsule Take 1 capsule (500 mg total) by mouth 3 (three) times daily. 270 capsule 3   cyanocobalamin  500 MCG tablet Take 1,000 mcg by mouth daily.      docusate sodium  (COLACE) 100 MG capsule Take 2 capsules (200 mg total) by mouth daily as needed for mild constipation. 10 capsule 0   fluticasone  (FLONASE ) 50 MCG/ACT nasal spray Place 1 spray into both nostrils 2 (two) times daily as needed for allergies. 9.9 mL 5   gabapentin  (NEURONTIN ) 300 MG capsule TAKE 2 CAPSULES BY MOUTH 4 TIMES DAILY 240 capsule 12   hydrALAZINE  (APRESOLINE ) 100 MG tablet  Take 1 tablet (100 mg total) by mouth 3 (three) times daily. 270 tablet 3   hydrOXYzine  (ATARAX ) 50 MG tablet Take 1 tablet (50 mg total) by mouth 3 (three) times daily as needed for itching. 60 tablet 3   Ipratropium-Albuterol  (COMBIVENT ) 20-100 MCG/ACT AERS respimat Inhale 1 puff into the lungs every 6 (six) hours as needed for wheezing or shortness of breath. 4 g 5   nystatin  powder Apply 1 Application topically 3 (three) times daily. 15 g 11   pantoprazole  (PROTONIX ) 40 MG tablet Take 1 tablet by mouth once daily 90 tablet 0   sacubitril -valsartan  (ENTRESTO ) 49-51 MG Take 1 tablet by mouth 2 (two) times daily.  180 tablet 3   sildenafil  (VIAGRA ) 100 MG tablet Take 0.5 tablets (50 mg total) by mouth daily as needed for erectile dysfunction. 10 tablet 6   spironolactone  (ALDACTONE ) 25 MG tablet Take 1 tablet (25 mg total) by mouth daily. 90 tablet 3   thiamine  100 MG tablet Take 1 tablet (100 mg total) by mouth daily. 30 tablet 0   warfarin (COUMADIN ) 5 MG tablet TAKE 0.5 (ONE-HALF) TABLET BY MOUTH EVERY TUESDAY AND ONE TABLET ONCE DAILY ON ALL OTHER DAYS OR AS INSTRUCTED BY HF CLINIC 30 tablet 8   Zinc  Gluconate 100 MG TABS Take 1 tablet (100 mg total) by mouth daily. 90 tablet 3   zolpidem  (AMBIEN ) 5 MG tablet Take 1 tablet (5 mg total) by mouth at bedtime as needed for sleep. 30 tablet 5   No current facility-administered medications for this encounter.    Chlorhexidine , Atorvastatin , and Other  REVIEW OF SYSTEMS: All systems negative except as listed in HPI, PMH and Problem list.   LVAD INTERROGATION:  Please see LVAD nurse's note above for full interrogation.  I reviewed with nurse.   I reviewed the LVAD parameters from today, and compared the results to the patient's prior recorded data.  No programming changes were made.  The LVAD is functioning within specified parameters.  The patient performs LVAD self-test daily.  LVAD interrogation was negative for any significant power changes,  alarms or PI events/speed drops.  LVAD equipment check completed and is in good working order.  Back-up equipment present.   LVAD education done on emergency procedures and precautions and reviewed exit site care.   BP (!) 89/69 Comment: 76  Pulse 72   Ht 5\' 11"  (1.803 m)   Wt 88.1 kg (194 lb 3.2 oz)   SpO2 99%   BMI 27.09 kg/m  MAP 76  Physical Exam: General: Well appearing this am. NAD.  HEENT: Normal. Neck: Supple, JVP 7-8 cm. Carotids OK.  Cardiac:  Mechanical heart sounds with LVAD hum present.  Lungs:  CTAB, normal effort.  Abdomen:  NT, ND, no HSM. No bruits or masses. +BS  LVAD exit site: Well-healed and incorporated. Dressing dry and intact. No erythema or drainage. Stabilization device present and accurately applied. Driveline dressing changed daily per sterile technique. Extremities:  Warm and dry. No cyanosis, clubbing, rash, or edema.  Neuro:  Alert & oriented x 3. Cranial nerves grossly intact. Moves all 4 extremities w/o difficulty. Affect pleasant      ASSESSMENT AND PLAN:   1. 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  Echo in 3/25 was difficult to interpret, but probably midline interventricular septum, no speed change. LVAD parameters reviewed and stable with a ongoing chronic PI events daily but no low flows.  NYHA class II. MAP controlled.    - BMET, LDH today.    - Continue spironolactone  25 mg daily.  - Continue Entresto  49/51 bid    - Continue hydralazine  100 mg tid.  - Off Coreg  with bradycardia and RV dysfunction by a prior echo.  - Continue amlodipine  5 mg daily.  - He is off ASA with GI bleeding.  - Continue warfarin for INR 1.8-2.3.    - Would be transplant candidate eventually if he can quit smoking.  Will make transplant clinic referral when he is off totally.  2. Smoking: Still smoking.  He tried Chantix  and Wellbutrin  but they did not work.  We discussed cessation again.  3. RLE DVT: On  warfarin for LVAD.  INR sent today.  4. OSA: Continue CPAP.   5. Type II diabetes: Per PCP.  6. 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  Site looks benign. - He is on long-term Keflex  for MSSA suppression.  Followup with ID.  8. 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 No overt bleeding. - CBC today.  - Off ASA, INR goal now 1.8-2.3.  - Continue monthly octreotide  injections.   Followup in 2 months    I spent 41 minutes reviewing chart, interacting with patient, reviewed LVAD parameters, and managing orders.   Peder Bourdon 04/07/2024

## 2024-04-07 NOTE — Patient Instructions (Signed)
 No change in medications  Return to clinic in 2 months

## 2024-04-13 ENCOUNTER — Ambulatory Visit (HOSPITAL_COMMUNITY): Payer: Self-pay | Admitting: Pharmacist

## 2024-04-13 LAB — POCT INR: INR: 2.1 (ref 2.0–3.0)

## 2024-04-18 ENCOUNTER — Other Ambulatory Visit (HOSPITAL_COMMUNITY): Payer: Self-pay | Admitting: Cardiology

## 2024-04-18 DIAGNOSIS — Z95811 Presence of heart assist device: Secondary | ICD-10-CM

## 2024-04-21 ENCOUNTER — Ambulatory Visit (HOSPITAL_COMMUNITY): Payer: Self-pay | Admitting: Pharmacist

## 2024-04-21 LAB — POCT INR: INR: 1.9 — AB (ref 2.0–3.0)

## 2024-04-27 ENCOUNTER — Ambulatory Visit (HOSPITAL_COMMUNITY): Payer: Self-pay | Admitting: Pharmacist

## 2024-04-27 LAB — POCT INR: INR: 2 (ref 2.0–3.0)

## 2024-04-30 ENCOUNTER — Other Ambulatory Visit (HOSPITAL_COMMUNITY): Payer: Self-pay | Admitting: *Deleted

## 2024-04-30 DIAGNOSIS — Z95811 Presence of heart assist device: Secondary | ICD-10-CM

## 2024-04-30 DIAGNOSIS — Z7901 Long term (current) use of anticoagulants: Secondary | ICD-10-CM

## 2024-04-30 DIAGNOSIS — K31811 Angiodysplasia of stomach and duodenum with bleeding: Secondary | ICD-10-CM

## 2024-05-03 ENCOUNTER — Ambulatory Visit (HOSPITAL_COMMUNITY)
Admission: RE | Admit: 2024-05-03 | Discharge: 2024-05-03 | Disposition: A | Discharge status: Home / Self Care | Source: Ambulatory Visit | Attending: Cardiology | Admitting: Cardiology

## 2024-05-03 VITALS — BP 103/78 | HR 80 | Temp 97.4°F | Resp 18

## 2024-05-03 DIAGNOSIS — Z95811 Presence of heart assist device: Secondary | ICD-10-CM | POA: Insufficient documentation

## 2024-05-03 DIAGNOSIS — K51811 Other ulcerative colitis with rectal bleeding: Secondary | ICD-10-CM | POA: Insufficient documentation

## 2024-05-03 MED ORDER — OCTREOTIDE ACETATE 20 MG IM KIT
PACK | INTRAMUSCULAR | Status: AC
  Filled 2024-05-03: qty 1

## 2024-05-03 MED ORDER — OCTREOTIDE ACETATE 20 MG IM KIT: 20.0000 mg | PACK | INTRAMUSCULAR | Status: DC

## 2024-05-04 ENCOUNTER — Ambulatory Visit (HOSPITAL_COMMUNITY): Payer: Self-pay | Admitting: Pharmacist

## 2024-05-04 LAB — POCT INR: INR: 2 (ref 2.0–3.0)

## 2024-05-11 ENCOUNTER — Ambulatory Visit (HOSPITAL_COMMUNITY): Payer: Self-pay | Admitting: Pharmacist

## 2024-05-11 ENCOUNTER — Other Ambulatory Visit (HOSPITAL_COMMUNITY): Payer: Self-pay | Admitting: *Deleted

## 2024-05-11 DIAGNOSIS — Z7901 Long term (current) use of anticoagulants: Secondary | ICD-10-CM

## 2024-05-11 DIAGNOSIS — Z95811 Presence of heart assist device: Secondary | ICD-10-CM

## 2024-05-11 LAB — POCT INR: INR: 2.2 (ref 2.0–3.0)

## 2024-05-11 MED ORDER — WARFARIN SODIUM 5 MG PO TABS
ORAL_TABLET | ORAL | 3 refills | Status: AC
Start: 2024-05-11 — End: ?

## 2024-05-18 ENCOUNTER — Ambulatory Visit (HOSPITAL_COMMUNITY): Payer: Self-pay | Admitting: Pharmacist

## 2024-05-18 LAB — POCT INR: INR: 2 (ref 2.0–3.0)

## 2024-05-25 ENCOUNTER — Ambulatory Visit (HOSPITAL_COMMUNITY): Payer: Self-pay | Admitting: Pharmacist

## 2024-05-25 LAB — POCT INR: INR: 2.1 (ref 2.0–3.0)

## 2024-05-27 ENCOUNTER — Other Ambulatory Visit (HOSPITAL_COMMUNITY): Payer: Self-pay

## 2024-05-27 DIAGNOSIS — Z7901 Long term (current) use of anticoagulants: Secondary | ICD-10-CM

## 2024-05-27 DIAGNOSIS — K5521 Angiodysplasia of colon with hemorrhage: Secondary | ICD-10-CM

## 2024-05-27 DIAGNOSIS — Z95811 Presence of heart assist device: Secondary | ICD-10-CM

## 2024-05-31 ENCOUNTER — Ambulatory Visit (HOSPITAL_COMMUNITY): Payer: Self-pay | Admitting: Pharmacist

## 2024-05-31 ENCOUNTER — Ambulatory Visit (HOSPITAL_COMMUNITY)
Admission: RE | Admit: 2024-05-31 | Discharge: 2024-05-31 | Disposition: A | Discharge status: Home / Self Care | Source: Ambulatory Visit | Attending: Cardiology | Admitting: Cardiology

## 2024-05-31 ENCOUNTER — Other Ambulatory Visit (HOSPITAL_COMMUNITY): Payer: Self-pay

## 2024-05-31 DIAGNOSIS — K5521 Angiodysplasia of colon with hemorrhage: Secondary | ICD-10-CM | POA: Diagnosis present

## 2024-05-31 DIAGNOSIS — Z7901 Long term (current) use of anticoagulants: Secondary | ICD-10-CM | POA: Diagnosis present

## 2024-05-31 DIAGNOSIS — Z95811 Presence of heart assist device: Secondary | ICD-10-CM | POA: Insufficient documentation

## 2024-05-31 LAB — POCT INR: INR: 1.9 — AB (ref 2.0–3.0)

## 2024-05-31 MED ORDER — OCTREOTIDE ACETATE 20 MG IM KIT
PACK | INTRAMUSCULAR | Status: AC
Start: 2024-05-31 — End: 2024-05-31
  Filled 2024-05-31: qty 1

## 2024-05-31 MED ORDER — OCTREOTIDE ACETATE 20 MG IM KIT
20.0000 mg | PACK | INTRAMUSCULAR | Status: DC
  Administered 2024-05-31: 20 mg via INTRAMUSCULAR

## 2024-06-06 ENCOUNTER — Other Ambulatory Visit (HOSPITAL_COMMUNITY): Payer: Self-pay | Admitting: Cardiology

## 2024-06-06 DIAGNOSIS — I5022 Chronic systolic (congestive) heart failure: Secondary | ICD-10-CM

## 2024-06-06 DIAGNOSIS — Z95811 Presence of heart assist device: Secondary | ICD-10-CM

## 2024-06-06 DIAGNOSIS — M797 Fibromyalgia: Secondary | ICD-10-CM

## 2024-06-08 ENCOUNTER — Other Ambulatory Visit (HOSPITAL_COMMUNITY): Payer: Self-pay

## 2024-06-08 ENCOUNTER — Ambulatory Visit (HOSPITAL_COMMUNITY): Payer: Self-pay | Admitting: Pharmacist

## 2024-06-08 DIAGNOSIS — Z95811 Presence of heart assist device: Secondary | ICD-10-CM

## 2024-06-08 DIAGNOSIS — Z7901 Long term (current) use of anticoagulants: Secondary | ICD-10-CM

## 2024-06-08 LAB — POCT INR: INR: 2.2 (ref 2.0–3.0)

## 2024-06-09 ENCOUNTER — Encounter (HOSPITAL_COMMUNITY): Admitting: Cardiology

## 2024-06-14 ENCOUNTER — Encounter (HOSPITAL_COMMUNITY): Admitting: Cardiology

## 2024-06-15 ENCOUNTER — Ambulatory Visit (HOSPITAL_COMMUNITY): Payer: Self-pay | Admitting: Pharmacist

## 2024-06-15 LAB — POCT INR: INR: 2.1 (ref 2.0–3.0)

## 2024-06-18 ENCOUNTER — Ambulatory Visit (HOSPITAL_COMMUNITY)
Admission: RE | Admit: 2024-06-18 | Discharge: 2024-06-18 | Disposition: A | Discharge status: Home / Self Care | Source: Ambulatory Visit | Attending: Cardiology | Admitting: Cardiology

## 2024-06-18 ENCOUNTER — Ambulatory Visit (HOSPITAL_COMMUNITY): Payer: Self-pay | Admitting: Cardiology

## 2024-06-18 DIAGNOSIS — Z79899 Other long term (current) drug therapy: Secondary | ICD-10-CM | POA: Insufficient documentation

## 2024-06-18 DIAGNOSIS — Z8619 Personal history of other infectious and parasitic diseases: Secondary | ICD-10-CM | POA: Insufficient documentation

## 2024-06-18 DIAGNOSIS — M797 Fibromyalgia: Secondary | ICD-10-CM | POA: Diagnosis not present

## 2024-06-18 DIAGNOSIS — Z4509 Encounter for adjustment and management of other cardiac device: Secondary | ICD-10-CM | POA: Diagnosis present

## 2024-06-18 DIAGNOSIS — K761 Chronic passive congestion of liver: Secondary | ICD-10-CM | POA: Insufficient documentation

## 2024-06-18 DIAGNOSIS — N529 Male erectile dysfunction, unspecified: Secondary | ICD-10-CM

## 2024-06-18 DIAGNOSIS — Z792 Long term (current) use of antibiotics: Secondary | ICD-10-CM | POA: Insufficient documentation

## 2024-06-18 DIAGNOSIS — J449 Chronic obstructive pulmonary disease, unspecified: Secondary | ICD-10-CM

## 2024-06-18 DIAGNOSIS — G4709 Other insomnia: Secondary | ICD-10-CM

## 2024-06-18 DIAGNOSIS — G4733 Obstructive sleep apnea (adult) (pediatric): Secondary | ICD-10-CM | POA: Diagnosis not present

## 2024-06-18 DIAGNOSIS — Z86718 Personal history of other venous thrombosis and embolism: Secondary | ICD-10-CM | POA: Insufficient documentation

## 2024-06-18 DIAGNOSIS — I428 Other cardiomyopathies: Secondary | ICD-10-CM | POA: Insufficient documentation

## 2024-06-18 DIAGNOSIS — Z8719 Personal history of other diseases of the digestive system: Secondary | ICD-10-CM | POA: Diagnosis not present

## 2024-06-18 DIAGNOSIS — Z4801 Encounter for change or removal of surgical wound dressing: Secondary | ICD-10-CM | POA: Insufficient documentation

## 2024-06-18 DIAGNOSIS — I5022 Chronic systolic (congestive) heart failure: Secondary | ICD-10-CM | POA: Diagnosis not present

## 2024-06-18 DIAGNOSIS — Z79890 Hormone replacement therapy (postmenopausal): Secondary | ICD-10-CM | POA: Insufficient documentation

## 2024-06-18 DIAGNOSIS — Z95811 Presence of heart assist device: Secondary | ICD-10-CM | POA: Diagnosis not present

## 2024-06-18 DIAGNOSIS — E119 Type 2 diabetes mellitus without complications: Secondary | ICD-10-CM | POA: Diagnosis not present

## 2024-06-18 DIAGNOSIS — Z7901 Long term (current) use of anticoagulants: Secondary | ICD-10-CM | POA: Insufficient documentation

## 2024-06-18 DIAGNOSIS — F17210 Nicotine dependence, cigarettes, uncomplicated: Secondary | ICD-10-CM | POA: Insufficient documentation

## 2024-06-18 LAB — PROTIME-INR
INR: 1.9 — ABNORMAL HIGH (ref 0.8–1.2)
Prothrombin Time: 22.6 s — ABNORMAL HIGH (ref 11.4–15.2)

## 2024-06-18 LAB — CBC
HCT: 40.2 % (ref 39.0–52.0)
Hemoglobin: 12.9 g/dL — ABNORMAL LOW (ref 13.0–17.0)
MCH: 28 pg (ref 26.0–34.0)
MCHC: 32.1 g/dL (ref 30.0–36.0)
MCV: 87.4 fL (ref 80.0–100.0)
Platelets: 249 K/uL (ref 150–400)
RBC: 4.6 MIL/uL (ref 4.22–5.81)
RDW: 14.4 % (ref 11.5–15.5)
WBC: 9.7 K/uL (ref 4.0–10.5)
nRBC: 0 % (ref 0.0–0.2)

## 2024-06-18 LAB — BASIC METABOLIC PANEL WITH GFR
Anion gap: 12 (ref 5–15)
BUN: 11 mg/dL (ref 8–23)
CO2: 18 mmol/L — ABNORMAL LOW (ref 22–32)
Calcium: 8.8 mg/dL — ABNORMAL LOW (ref 8.9–10.3)
Chloride: 105 mmol/L (ref 98–111)
Creatinine, Ser: 1.18 mg/dL (ref 0.61–1.24)
GFR, Estimated: >60 mL/min (ref >60)
Glucose, Bld: 315 mg/dL — ABNORMAL HIGH (ref 70–99)
Potassium: 4 mmol/L (ref 3.5–5.1)
Sodium: 135 mmol/L (ref 135–145)

## 2024-06-18 LAB — LACTATE DEHYDROGENASE: LDH: 180 U/L (ref 98–192)

## 2024-06-18 MED ORDER — ZOLPIDEM TARTRATE 5 MG PO TABS: 5.0000 mg | ORAL_TABLET | Freq: Every evening | ORAL | 5 refills | Status: DC | PRN

## 2024-06-18 MED ORDER — SILDENAFIL CITRATE 100 MG PO TABS: 50.0000 mg | ORAL_TABLET | Freq: Every day | ORAL | 6 refills | Status: AC | PRN

## 2024-06-18 MED ORDER — SACUBITRIL-VALSARTAN 49-51 MG PO TABS: 1.0000 | ORAL_TABLET | Freq: Two times a day (BID) | ORAL | 3 refills | Status: DC

## 2024-06-18 MED ORDER — IPRATROPIUM-ALBUTEROL 20-100 MCG/ACT IN AERS: 1.0000 | INHALATION_SPRAY | Freq: Four times a day (QID) | RESPIRATORY_TRACT | 5 refills | Status: DC | PRN

## 2024-06-18 MED ORDER — ZOLPIDEM TARTRATE 5 MG PO TABS
5.0000 mg | ORAL_TABLET | Freq: Every evening | ORAL | 5 refills | Status: DC | PRN
Start: 2024-06-18 — End: 2024-06-18

## 2024-06-18 MED ORDER — CEPHALEXIN 500 MG PO CAPS: 500.0000 mg | ORAL_CAPSULE | Freq: Three times a day (TID) | ORAL | 3 refills | Status: DC

## 2024-06-18 MED ORDER — GABAPENTIN 300 MG PO CAPS: 600.0000 mg | ORAL_CAPSULE | Freq: Four times a day (QID) | ORAL | 3 refills | Status: DC

## 2024-06-18 NOTE — Progress Notes (Signed)
 Patient presents for 2 mo f/u in [facility] today with his wife Newell. Reports no problems with VAD equipment or concerns with drive line.  Pt reports he has been feeling good.  Denies lightheadedness, dizziness, falls, heart failure symptoms, and signs of bleeding. Pt endorses good appetite. He is remaining active helping with his grandkids and fishing.  Newell is changing his drive line dressing weekly using daily kit. Pt reports no issues with drive line. Remains on Keflex  TID per ID.   Receiving monthly Octreotide  injections.   Smoking cessation encouraged by Dr. Rolan.  Vital Signs: Doppler Pressure: 84 Automatic BP: 95/76 (83) HR: 80 SR SPO2: 98% RA   Weight: 195 lbs w/o equipment Last weight: 196.8 lbs w/o equipment  VAD Indication: Destination Therapy due to smoking status   VAD interrogation & Equipment Management: Speed: 5800 Flow: 4.8 Power: 4.8 w    PI: 6.1 Hct: 42   Alarms: none Events: 70-80 PI Events  Fixed speed: 5800 Low speed limit: 5500  Primary Controller:   Replace back up battery in 24 months. Back up controller:   Replace back up battery in 8 months.    Annual Equipment Maintenance on UBC/PM was performed on 08/11/23.  I reviewed the LVAD parameters from today and compared the results to the patient's prior recorded data. LVAD interrogation was NEGATIVE for significant power changes, NEGATIVE for clinical alarms and NEGATIVE for PI events/speed drops. No programming changes were made and pump is functioning within specified parameters. Pt is performing daily controller and system monitor self tests along with completing weekly and monthly maintenance for LVAD equipment.  LVAD equipment check completed and is in good working order. Back-up equipment present.   Exit Site Care: Drive line is being maintained weekly by [name]. Dressing CDI. Existing VAD dressing removed and site care performed using sterile technique. Drive line exit site cleaned  with Vashe x 2, allowed to dry, and gauze dressing with Vashe moistened 2 x 2 applied around driveline. Exit site healed and incorporated, the velour is fully implanted at exit site. No redness, tenderness, drainage, foul odor or rash noted. Small scab around driveline removed with cleaning. Drive line anchor re-applied. Pt denies fever or chills. Pt given adhesive remover, alcohol wipes and sterile 2x2's.  Pt had rescue tape rolling up on drive line. Extra piece of rescue tape applied to secure site.   Device: N/A   BP & Labs:  Doppler 84 - correlates with MAP   Hgb  -No S/S of bleeding. Specifically denies BRBPR.   LDH stable at  with established baseline of 150 - 220. Denies tea-colored urine. No power elevations noted on interrogation.   Patient Instructions: No change in medications Coumadin  dosing by [facility] Return to clinic in 2 months   Schuyler Lunger RN,BSN VAD Coordinator  Office: [phone]  24/7 Pager: [phone]   Chief complaint: LVAD followup  63 y.o. with history of nonischemic cardiomyopathy, RLE DVT, cirrhosis, smoking, and OSA returns for followup of CHF/LVAD placement.  Cardiomyopathy was diagnosed in 6/19 in [place], [place] at that time showed low output.  He was admitted to [facility] in 9/19 with low output HF and was started on milrinone  and diuresed.  Unable to wean off milrinone .  He had a degree of RV failure, but this improved on milrinone .  Valvular heart disease also looked better with milrinone  and diuresis.  On 08/03/18, he had Heartmate 3 LVAD placed.  Speed was optimized by ramp echo post-op.  Post-op course  was relatively unremarkable.  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  No abscess on CT.  He was started on cefazolin  IV for 6 wks.  BP-active meds decreased with low MAP.   He was admitted in 3/21 with subxiphoid abscess and cellulitis.  CT showed collection  along the course of the driveline.  There was no evidence for sternal osteomyelitis.  Abscess was debrided, MSSA grew from wound cultures.  Wound vac was placed.  Patient was started on cefazolin , which was recently stopped after completing 8 wks.   Patient was re-admitted later in 3/21 with facial Zoster.  He was treated with acyclovir  and this has resolved.   He was admitted in 6/21 with bleeding from the site of the subxiphoid abscess as well as cellulitis.  He required multiple units of PRBCs.  He went to the OR initially for I&D.  Blood and wound cultures grew MSSA.  He then went back to the OR for rectus flap over wound site (plastics).  He was started on cefazolin  and rifabutin , has PICC.   He was admitted in 7/21 with GI bleeding, he had 6 units PRBCs.  EGD showed duodenal AVMs, treated with APC.  INR goal with warfarin lowered to 1.8-2.3.   Echo in 5/22 was a difficult study with EF 25-30%, mild LVH, aortic valve difficult to visualize but appears to open with each beat, mildly dilated RV with moderately decreased systolic function.  Echo in 7/23 was a very difficult study.  Echo in 5/24 showed EF 20-25% with midline interventricular septum, RV moderately dilated with moderately decreased systolic function, IVC normal. Echo in 3/25 was technically difficult study, interventricular septum was poorly visualized but probably midline, RV not seen.   He returns for LVAD followup.   Chronically has frequent PI events, no low flows.  He had a bout with diarrhea last week, but feels back to normal now.  No lightheadedness, no exertional dyspnea.  Able to do all his activities with no limitations.  Generally feels good. Still smokes a few cigarettes/day.      Denies LVAD alarms.  Reports taking Coumadin  as prescribed and adherence to anticoagulation based dietary restrictions.  Denies bright red blood per rectum or melena, no dark urine or hematuria.  Gets monthly octreotide .   Labs (3/24): LDL 219,  hgb 13.4, K 5.4, creatinine 1.54 => 1.43 Labs (5/24): hgb 13 Labs (6/24): K 3.7, creatinine 0.99 Labs (10/24): K 4.4, creatinine 1.3 Labs (11/24): hgb 14, K 4.4, creatinine 1.41 Labs (3/25): hgb 14.1, K 4.3, creatinine 1.4, LFTs normal, LDH 214 Labs (5/25): hgb 13.3, LDH 194, K 3.8, creatinine 1.16, LFTs normal  PMH: 1. Degenerative disc disease.  2. GERD 3. Chronic systolic CHF:  Nonischemic cardiomyopathy.  Dilated cardiomyopathy diagnosed 6/19 in [place].  LHC/RHC in 7/19 showed elevated filling pressures, low cardiac output, and no significant CAD.   - Echo (9/19): Severe LV dilation with EF 10-20%, moderate-severe MR, severely dilated RV with mildly decreased systolic function, severe TR.  - Cardiac MRI (9/19): EF 14%, moderate dilated LV, severely dilated RV with mod-severe systolic function and EF 25%, nonspecific RV insertion site LGE.  - RHC (5/19) on milrinone  0.375: mean RA 8, PA 40/19, mean PCWP 12, PAPi 2.65, CI 2.71.  - Echo (9/19) on milrinone  and diuresed): EF 20-25% with moderate LV dilation, moderately dilated RV with mildly decreased systolic function, mild-moderate MR, moderate TR, PASP  51 mmHg.  Cannot rule out noncompaction.  - Heartmate 3 LVAD placement in 9/19.  - Echo (5/22): EF 25-30%, mild LVH, aortic valve difficult to visualize but appears to open with each beat, mildly dilated RV with moderately decreased systolic function. - Echo (5/24): EF 20-25% with midline interventricular septum, RV moderately dilated with moderately decreased systolic function, IVC normal.  - Echo (3/25): Technically difficult, RV not visualized, interventricular septum was probably midline.  4. OSA: CPAP use.  5. Prior smoker 6. Type 2 diabetes 7. Hyperlipidemia.  8. H/o NSVT 9. 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  10. RLE DVT: found in 9/19.  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 Subxiphoid abscess involving collection  along driveline in 3/21, MSSA.  6/21 Rectus flap to cover abscess site.  12. Facial Zoster 3/21 13. 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   Current Outpatient Medications  Medication Sig Dispense Refill   acetaminophen  (TYLENOL ) 500 MG tablet Take 1,000 mg by mouth every 6 (six) hours as needed for mild pain.     amLODipine  (NORVASC ) 5 MG tablet Take 2 tablets (10 mg total) by mouth daily. 180 tablet 3   carbamide peroxide (DEBROX) 6.5 % OTIC solution Place 5 drops into both ears 3 (three) times daily. 15 mL 0   cyanocobalamin  500 MCG tablet Take 1,000 mcg by mouth daily.      docusate sodium  (COLACE) 100 MG capsule Take 2 capsules (200 mg total) by mouth daily as needed for mild constipation. 10 capsule 0   fluticasone  (FLONASE ) 50 MCG/ACT nasal spray Place 1 spray into both nostrils 2 (two) times daily as needed for allergies. 9.9 mL 5   hydrALAZINE  (APRESOLINE ) 100 MG tablet Take 1 tablet (100 mg total) by mouth 3 (three) times daily. 270 tablet 3   hydrOXYzine  (ATARAX ) 50 MG tablet Take 1 tablet (50 mg total) by mouth 3 (three) times daily as needed for itching. 60 tablet 3   nystatin  powder Apply 1 Application topically 3 (three) times daily. 15 g 11   pantoprazole  (PROTONIX ) 40 MG tablet Take 1 tablet by mouth once daily 90 tablet 3   spironolactone  (ALDACTONE ) 25 MG tablet Take 1 tablet (25 mg total) by mouth daily. 90 tablet 3   thiamine  100 MG tablet Take 1 tablet (100 mg total) by mouth daily. 30 tablet 0   warfarin (COUMADIN ) 5 MG tablet Take 2.5 mg (5 mg x 0.5) every Tue; 5 mg (5 mg x 1) all other days, OR AS INSTRUCTED BY HF CLINIC 90 tablet 3   Zinc  Gluconate 100 MG TABS Take 1 tablet (100 mg total) by mouth daily. 90 tablet 3   cephALEXin  (KEFLEX ) 500 MG capsule Take 1 capsule (500 mg total) by mouth 3 (three) times daily. 270 capsule 3   gabapentin  (NEURONTIN ) 300 MG capsule Take 2 capsules (600 mg total) by mouth 4 (four) times daily. 240 capsule 3    Ipratropium-Albuterol  (COMBIVENT ) 20-100 MCG/ACT AERS respimat Inhale 1 puff into the lungs every 6 (six) hours as needed for wheezing or shortness of breath. 4 g 5   sacubitril -valsartan  (ENTRESTO ) 49-51 MG Take 1 tablet by mouth 2 (two) times daily. 180 tablet 3   sildenafil  (VIAGRA ) 100 MG tablet Take 0.5 tablets (50 mg total) by mouth daily as needed for erectile dysfunction. 10 tablet 6   zolpidem  (AMBIEN ) 5 MG tablet Take 1 tablet (5 mg total) by mouth at bedtime as needed  for sleep. 30 tablet 5   No current facility-administered medications for this encounter.    Chlorhexidine , Atorvastatin , and Other  REVIEW OF SYSTEMS: All systems negative except as listed in HPI, PMH and Problem list.   LVAD INTERROGATION:  Please see LVAD nurse's note above for full interrogation.  I reviewed with nurse.   I reviewed the LVAD parameters from today, and compared the results to the patient's prior recorded data.  No programming changes were made.  The LVAD is functioning within specified parameters.  The patient performs LVAD self-test daily.  LVAD interrogation was negative for any significant power changes, alarms or PI events/speed drops.  LVAD equipment check completed and is in good working order.  Back-up equipment present.   LVAD education done on emergency procedures and precautions and reviewed exit site care.   BP (!) 84/0   Pulse 80   Wt 88.5 kg (195 lb)   SpO2 98%   BMI 27.20 kg/m  MAP 83  Physical Exam: General: Well appearing this am. NAD.  HEENT: Normal. Neck: Supple, JVP 7-8 cm. Carotids OK.  Cardiac:  Mechanical heart sounds with LVAD hum present.  Lungs:  CTAB, normal effort.  Abdomen:  NT, ND, no HSM. No bruits or masses. +BS  LVAD exit site: Well-healed and incorporated. Dressing dry and intact. No erythema or drainage. Stabilization device present and accurately applied. Driveline dressing changed daily per sterile technique. Extremities:  Warm and dry. No cyanosis,  clubbing, rash, or edema.  Neuro:  Alert & oriented x 3. Cranial nerves grossly intact. Moves all 4 extremities w/o difficulty. Affect pleasant      ASSESSMENT AND PLAN:   1. 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  Echo in 3/25 was difficult to interpret, but probably midline interventricular septum, no speed change. LVAD parameters reviewed personally and stable with a ongoing chronic PI events daily but no low flows.  NYHA class I-II, MAP is controlled.   Driveline site was examined and looks benign.  - BMET, LDH today.    - Continue spironolactone  25 mg daily.  - Continue Entresto  49/51 bid    - Continue hydralazine  100 mg tid.  - Off Coreg  with bradycardia and RV dysfunction.   - Continue amlodipine  10 mg daily.  - Continue warfarin for INR 1.8-2.3.    - Would be transplant candidate eventually if he can quit smoking.  Will make transplant clinic referral when he is off totally.  2. Smoking: Still smoking.  He tried Chantix  and Wellbutrin  but they did not work.  We discussed cessation again.  3. RLE DVT: On warfarin for LVAD. INR sent today.  4. OSA: Continue CPAP.   5. Type II diabetes: Per PCP.  6. 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  Site looks benign. - He is on long-term Keflex  for MSSA suppression.  Followup with ID.  8. 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 No overt bleeding. - CBC today.  - Off ASA, INR goal now 1.8-2.3.  - Continue monthly octreotide  injections.   Followup in 2 months    I spent 42 minutes reviewing chart, interacting with patient, reviewed LVAD parameters, and managing orders.   Ezra Shuck 06/18/2024

## 2024-06-23 ENCOUNTER — Ambulatory Visit (HOSPITAL_COMMUNITY): Payer: Self-pay | Admitting: Pharmacist

## 2024-06-23 ENCOUNTER — Other Ambulatory Visit (HOSPITAL_COMMUNITY): Payer: Self-pay | Admitting: *Deleted

## 2024-06-23 LAB — POCT INR: INR: 2 (ref 2.0–3.0)

## 2024-06-28 ENCOUNTER — Encounter (HOSPITAL_COMMUNITY)
Admission: RE | Admit: 2024-06-28 | Discharge: 2024-06-28 | Disposition: A | Discharge status: Home / Self Care | Source: Ambulatory Visit | Attending: Cardiology | Admitting: Cardiology

## 2024-06-28 ENCOUNTER — Ambulatory Visit (HOSPITAL_COMMUNITY): Payer: Self-pay | Admitting: Pharmacist

## 2024-06-28 DIAGNOSIS — Z95811 Presence of heart assist device: Secondary | ICD-10-CM | POA: Insufficient documentation

## 2024-06-28 DIAGNOSIS — K5521 Angiodysplasia of colon with hemorrhage: Secondary | ICD-10-CM | POA: Insufficient documentation

## 2024-06-28 DIAGNOSIS — Z7901 Long term (current) use of anticoagulants: Secondary | ICD-10-CM | POA: Diagnosis not present

## 2024-06-28 LAB — POCT INR: INR: 2.1 (ref 2.0–3.0)

## 2024-06-28 MED ORDER — OCTREOTIDE ACETATE 20 MG IM KIT
PACK | INTRAMUSCULAR | Status: AC
  Filled 2024-06-28: qty 1

## 2024-06-28 MED ORDER — OCTREOTIDE ACETATE 20 MG IM KIT
20.0000 mg | PACK | INTRAMUSCULAR | Status: DC
  Administered 2024-06-28 (×2): 20 mg via INTRAMUSCULAR

## 2024-07-06 ENCOUNTER — Ambulatory Visit (HOSPITAL_COMMUNITY): Payer: Self-pay | Admitting: Pharmacist

## 2024-07-06 LAB — POCT INR: INR: 1.9 — AB (ref 2.0–3.0)

## 2024-07-13 ENCOUNTER — Ambulatory Visit (HOSPITAL_COMMUNITY): Payer: Self-pay | Admitting: Pharmacist

## 2024-07-13 LAB — POCT INR: INR: 2.1 (ref 2.0–3.0)

## 2024-07-20 ENCOUNTER — Ambulatory Visit (HOSPITAL_COMMUNITY): Payer: Self-pay | Admitting: Pharmacist

## 2024-07-20 LAB — POCT INR: INR: 2.1 (ref 2.0–3.0)

## 2024-07-23 ENCOUNTER — Telehealth: Payer: Self-pay | Admitting: Pharmacy Technician

## 2024-07-23 NOTE — Telephone Encounter (Signed)
 Auth Submission: NO AUTH NEEDED Site of care: Site of care: [facility] Payer: [employer] Medication & CPT/J Code(s) submitted: octreotide  J2353 Diagnosis Code: K92.2 Route of submission (phone, fax, portal):  Phone # Fax # Auth type: Buy/Bill HB Units/visits requested: 20MG  Q28D Reference number:  Approval from: 04/30/24 to 11/17/24

## 2024-07-25 ENCOUNTER — Other Ambulatory Visit (HOSPITAL_COMMUNITY): Payer: Self-pay | Admitting: Cardiology

## 2024-07-25 DIAGNOSIS — G4709 Other insomnia: Secondary | ICD-10-CM

## 2024-07-26 ENCOUNTER — Ambulatory Visit (HOSPITAL_COMMUNITY)
Admission: RE | Admit: 2024-07-26 | Discharge: 2024-07-26 | Disposition: A | Discharge status: Home / Self Care | Source: Ambulatory Visit | Attending: Cardiology | Admitting: Cardiology

## 2024-07-26 ENCOUNTER — Other Ambulatory Visit (HOSPITAL_COMMUNITY): Payer: Self-pay | Admitting: Unknown Physician Specialty

## 2024-07-26 DIAGNOSIS — Z95811 Presence of heart assist device: Secondary | ICD-10-CM

## 2024-07-26 DIAGNOSIS — M797 Fibromyalgia: Secondary | ICD-10-CM

## 2024-07-26 DIAGNOSIS — Z79818 Long term (current) use of other agents affecting estrogen receptors and estrogen levels: Secondary | ICD-10-CM | POA: Insufficient documentation

## 2024-07-26 DIAGNOSIS — Z7901 Long term (current) use of anticoagulants: Secondary | ICD-10-CM | POA: Diagnosis present

## 2024-07-26 DIAGNOSIS — K5521 Angiodysplasia of colon with hemorrhage: Secondary | ICD-10-CM | POA: Insufficient documentation

## 2024-07-26 DIAGNOSIS — G4709 Other insomnia: Secondary | ICD-10-CM

## 2024-07-26 DIAGNOSIS — I5022 Chronic systolic (congestive) heart failure: Secondary | ICD-10-CM

## 2024-07-26 MED ORDER — OCTREOTIDE ACETATE 20 MG IM KIT
PACK | INTRAMUSCULAR | Status: AC
Start: 2024-07-26 — End: 2024-07-26
  Filled 2024-07-26: qty 1

## 2024-07-26 MED ORDER — ZOLPIDEM TARTRATE 5 MG PO TABS: 5.0000 mg | ORAL_TABLET | Freq: Every day | ORAL | 5 refills | Status: DC

## 2024-07-26 MED ORDER — GABAPENTIN 300 MG PO CAPS: 600.0000 mg | ORAL_CAPSULE | Freq: Four times a day (QID) | ORAL | 3 refills | Status: AC

## 2024-07-26 MED ORDER — ZOLPIDEM TARTRATE 5 MG PO TABS: 5.0000 mg | ORAL_TABLET | Freq: Every day | ORAL | 5 refills | Status: AC

## 2024-07-26 MED ORDER — OCTREOTIDE ACETATE 20 MG IM KIT
20.0000 mg | PACK | INTRAMUSCULAR | Status: DC
  Administered 2024-07-26: 20 mg via INTRAMUSCULAR

## 2024-07-27 ENCOUNTER — Ambulatory Visit (HOSPITAL_COMMUNITY): Payer: Self-pay | Admitting: Pharmacist

## 2024-07-27 LAB — POCT INR: INR: 1.9 — AB (ref 2.0–3.0)

## 2024-08-03 ENCOUNTER — Ambulatory Visit (HOSPITAL_COMMUNITY): Payer: Self-pay | Admitting: Pharmacist

## 2024-08-03 LAB — POCT INR: INR: 2.1 (ref 2.0–3.0)

## 2024-08-10 ENCOUNTER — Ambulatory Visit (HOSPITAL_COMMUNITY): Payer: Self-pay | Admitting: Pharmacist

## 2024-08-10 LAB — POCT INR: INR: 2.1 (ref 2.0–3.0)

## 2024-08-13 ENCOUNTER — Other Ambulatory Visit (HOSPITAL_COMMUNITY): Payer: Self-pay | Admitting: Unknown Physician Specialty

## 2024-08-13 DIAGNOSIS — Z95811 Presence of heart assist device: Secondary | ICD-10-CM

## 2024-08-13 DIAGNOSIS — Z7901 Long term (current) use of anticoagulants: Secondary | ICD-10-CM

## 2024-08-16 ENCOUNTER — Ambulatory Visit (HOSPITAL_COMMUNITY): Payer: Self-pay | Admitting: Pharmacist

## 2024-08-16 ENCOUNTER — Ambulatory Visit (HOSPITAL_COMMUNITY): Payer: Self-pay | Admitting: Cardiology

## 2024-08-16 ENCOUNTER — Ambulatory Visit (HOSPITAL_COMMUNITY)
Admission: RE | Admit: 2024-08-16 | Discharge: 2024-08-16 | Disposition: A | Source: Ambulatory Visit | Attending: Cardiology | Admitting: Cardiology

## 2024-08-16 DIAGNOSIS — L299 Pruritus, unspecified: Secondary | ICD-10-CM | POA: Diagnosis not present

## 2024-08-16 DIAGNOSIS — Z7901 Long term (current) use of anticoagulants: Secondary | ICD-10-CM | POA: Insufficient documentation

## 2024-08-16 DIAGNOSIS — B9561 Methicillin susceptible Staphylococcus aureus infection as the cause of diseases classified elsewhere: Secondary | ICD-10-CM | POA: Insufficient documentation

## 2024-08-16 DIAGNOSIS — Z95811 Presence of heart assist device: Secondary | ICD-10-CM | POA: Diagnosis not present

## 2024-08-16 DIAGNOSIS — I428 Other cardiomyopathies: Secondary | ICD-10-CM | POA: Diagnosis not present

## 2024-08-16 DIAGNOSIS — I5022 Chronic systolic (congestive) heart failure: Secondary | ICD-10-CM | POA: Diagnosis present

## 2024-08-16 DIAGNOSIS — Z79899 Other long term (current) drug therapy: Secondary | ICD-10-CM | POA: Diagnosis not present

## 2024-08-16 DIAGNOSIS — E119 Type 2 diabetes mellitus without complications: Secondary | ICD-10-CM | POA: Insufficient documentation

## 2024-08-16 DIAGNOSIS — Z86718 Personal history of other venous thrombosis and embolism: Secondary | ICD-10-CM | POA: Diagnosis not present

## 2024-08-16 DIAGNOSIS — J449 Chronic obstructive pulmonary disease, unspecified: Secondary | ICD-10-CM

## 2024-08-16 DIAGNOSIS — Z716 Tobacco abuse counseling: Secondary | ICD-10-CM | POA: Insufficient documentation

## 2024-08-16 DIAGNOSIS — F17210 Nicotine dependence, cigarettes, uncomplicated: Secondary | ICD-10-CM | POA: Insufficient documentation

## 2024-08-16 DIAGNOSIS — I159 Secondary hypertension, unspecified: Secondary | ICD-10-CM

## 2024-08-16 DIAGNOSIS — G4733 Obstructive sleep apnea (adult) (pediatric): Secondary | ICD-10-CM | POA: Diagnosis not present

## 2024-08-16 LAB — COMPREHENSIVE METABOLIC PANEL WITH GFR
ALT: 20 U/L (ref 0–44)
AST: 24 U/L (ref 15–41)
Albumin: 4.1 g/dL (ref 3.5–5.0)
Alkaline Phosphatase: 99 U/L (ref 38–126)
Anion gap: 11 (ref 5–15)
BUN: 24 mg/dL — ABNORMAL HIGH (ref 8–23)
CO2: 18 mmol/L — ABNORMAL LOW (ref 22–32)
Calcium: 9.1 mg/dL (ref 8.9–10.3)
Chloride: 105 mmol/L (ref 98–111)
Creatinine, Ser: 1.44 mg/dL — ABNORMAL HIGH (ref 0.61–1.24)
GFR, Estimated: 54 mL/min — ABNORMAL LOW (ref >60)
Glucose, Bld: 140 mg/dL — ABNORMAL HIGH (ref 70–99)
Potassium: 4.4 mmol/L (ref 3.5–5.1)
Sodium: 134 mmol/L — ABNORMAL LOW (ref 135–145)
Total Bilirubin: 0.8 mg/dL (ref 0.0–1.2)
Total Protein: 7.1 g/dL (ref 6.5–8.1)

## 2024-08-16 LAB — PROTIME-INR
INR: 1.6 — ABNORMAL HIGH (ref 0.8–1.2)
Prothrombin Time: 19.8 s — ABNORMAL HIGH (ref 11.4–15.2)

## 2024-08-16 LAB — CBC
HCT: 41.6 % (ref 39.0–52.0)
Hemoglobin: 13.6 g/dL (ref 13.0–17.0)
MCH: 27.8 pg (ref 26.0–34.0)
MCHC: 32.7 g/dL (ref 30.0–36.0)
MCV: 84.9 fL (ref 80.0–100.0)
Platelets: 221 K/uL (ref 150–400)
RBC: 4.9 MIL/uL (ref 4.22–5.81)
RDW: 14.4 % (ref 11.5–15.5)
WBC: 10.3 K/uL (ref 4.0–10.5)
nRBC: 0 % (ref 0.0–0.2)

## 2024-08-16 LAB — PREALBUMIN: Prealbumin: 33 mg/dL (ref 18–38)

## 2024-08-16 LAB — LACTATE DEHYDROGENASE: LDH: 205 U/L — ABNORMAL HIGH (ref 98–192)

## 2024-08-16 MED ORDER — AMLODIPINE BESYLATE 5 MG PO TABS: 10.0000 mg | ORAL_TABLET | Freq: Every day | ORAL | 3 refills | Status: DC

## 2024-08-16 MED ORDER — SACUBITRIL-VALSARTAN 49-51 MG PO TABS: 1.0000 | ORAL_TABLET | Freq: Two times a day (BID) | ORAL | 3 refills | Status: DC

## 2024-08-16 MED ORDER — FLUTICASONE PROPIONATE 50 MCG/ACT NA SUSP: 1.0000 | Freq: Two times a day (BID) | NASAL | 5 refills | Status: AC | PRN

## 2024-08-16 MED ORDER — IPRATROPIUM-ALBUTEROL 20-100 MCG/ACT IN AERS: 1.0000 | INHALATION_SPRAY | Freq: Four times a day (QID) | RESPIRATORY_TRACT | 5 refills | Status: AC | PRN

## 2024-08-16 MED ORDER — HYDRALAZINE HCL 100 MG PO TABS: 100.0000 mg | ORAL_TABLET | Freq: Three times a day (TID) | ORAL | 3 refills | Status: DC

## 2024-08-16 NOTE — Progress Notes (Addendum)
 Patient presents for 2 mo f/u with 63 year Intermacs and Annual Maintenance in [facility] Clinic today with his wife Newell. Reports no problems with VAD equipment or concerns with drive line.  Pt reports he has been feeling good.  Denies lightheadedness, dizziness, falls, heart failure symptoms, and signs of bleeding. Pt endorses good appetite. He is remaining active helping with his grandkids and fishing.  Newell is changing his drive line dressing weekly using daily kit. Pt reports no issues with drive line. Remains on Keflex  TID per ID.   Receiving monthly Octreotide  injections.   Smoking cessation encouraged by Dr. Rolan.  Vital Signs: Doppler Pressure: 110 Automatic BP: 97/86 (91) HR: 74 SR SPO2: 99% RA   Weight: 195 lbs w/o equipment Last weight: 195 lbs w/o equipment  VAD Indication: Destination Therapy due to smoking status   VAD interrogation & Equipment Management: Speed: 5700 Flow: 4.4 Power: 4.5 w    PI: 5.5 Hct: 42   Alarms: none Events: >100+ PI Events  Fixed speed: 5800 Low speed limit: 5500  Primary Controller:   Replace back up battery in 23 months. Back up controller:   Replace back up battery in 6 months. Back up battery replaced today with [id number]. Replace in 32 months.  Annual Equipment Maintenance on UBC/PM was performed on 08/16/24.  I reviewed the LVAD parameters from today and compared the results to the patient's prior recorded data. LVAD interrogation was NEGATIVE for significant power changes, NEGATIVE for clinical alarms and NEGATIVE for PI events/speed drops. No programming changes were made and pump is functioning within specified parameters. Pt is performing daily controller and system monitor self tests along with completing weekly and monthly maintenance for LVAD equipment.  LVAD equipment check completed and is in good working order. Back-up equipment present.   Batteries Manufacture Date: Number of uses: Re-calibration  06/17/24 0 N/A    Annual maintenance completed per [employer] on patient's home power module and Warehouse manager.    Backup system controller 11 volt battery charged during visit.   63 year Intermacs follow up completed including:  Quality of Life, KCCQ-12, and Neurocognitive trail making.   Pt refused 6 minute walk.  Patient Goals: Continue spending time with his grandkids.  [place] Cardiomyopathy Questionnaire     08/16/2024   12:01 PM 02/10/2024    4:02 PM 08/11/2023    1:46 PM  KCCQ-12  1 a. Ability to shower/bathe Moderately limited Slightly limited Slightly limited  1 b. Ability to walk 1 block Moderately limited Slightly limited Slightly limited  1 c. Ability to hurry/jog Extremely limited Extremely limited Extremely limited  2. Edema feet/ankles/legs Never over the past 2 weeks Never over the past 2 weeks Never over the past 2 weeks  3. Limited by fatigue Less than once a week Less than once a week Less than once a week  4. Limited by dyspnea Less than once a week Never over the past 2 weeks Less than once a week  5. Sitting up / on 3+ pillows Never over the past 2 weeks Never over the past 2 weeks Never over the past 2 weeks  6. Limited enjoyment of life Slightly limited Slightly limited Slightly limited  7. Rest of life w/ symptoms Mostly satisfied Mostly satisfied Mostly satisfied  8 a. Participation in hobbies Moderately limited Slightly limited Severely limited  8 b. Participation in chores Moderately limited Slightly limited Moderately limited  8 c. Visiting family/friends Slightly limited Did not limit at all Slightly  limited     Exit Site Care: Drive line is being maintained weekly by [name]. Dressing CDI. Existing VAD dressing removed and site care performed using sterile technique. Drive line exit site cleaned with Vashe x 2, allowed to dry, and gauze dressing with Vashe moistened 2 x 2 applied around driveline. Exit site healed and incorporated, the velour is fully  implanted at exit site. No redness, tenderness, drainage, foul odor or rash noted. Small scab around driveline removed with cleaning. Drive line anchor re-applied. Pt denies fever or chills. Pt given adhesive remover, 8 daily kits and 6 anchors.   Small tear in drive line above the modular cable repaired with rescue tape.  Device: N/A   BP & Labs:  Doppler 110 - correlates with modified systolic.   Hgb 13.6 -No S/S of bleeding. Specifically denies BRBPR.   LDH stable at 205  with established baseline of 150 - 220. Denies tea-colored urine. No power elevations noted on interrogation.   Patient Instructions: No change in medications Coumadin  dosing by [facility] Return to clinic in 2 months   Schuyler Lunger RN,BSN VAD Coordinator  Office: [phone]  24/7 Pager: [phone]   Chief complaint: LVAD followup  63 y.o. with history of nonischemic cardiomyopathy, RLE DVT, cirrhosis, smoking, and OSA returns for followup of CHF/LVAD placement.  Cardiomyopathy was diagnosed in 6/19 in [place], [place] at that time showed low output.  He was [facility] to [facility] in [facility]/19 with low output HF and was started on milrinone  and diuresed.  Unable to wean off milrinone .  He had a degree of RV failure, but this improved on milrinone .  Valvular heart disease also looked better with milrinone  and diuresis.  On 08/03/18, he had Heartmate 3 LVAD placed.  Speed was optimized by ramp echo post-op.  Post-op course was relatively unremarkable.  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  No abscess on CT.  He was started on cefazolin  IV for 6 wks.  BP-active meds decreased with low MAP.   He was admitted in 3/21 with subxiphoid abscess and cellulitis.  CT showed collection along the course of the driveline.  There was no evidence for sternal osteomyelitis.  Abscess was debrided, MSSA grew from wound cultures.  Wound vac was placed.   Patient was started on cefazolin , which was recently stopped after completing 8 wks.   Patient was re-admitted later in 3/21 with facial Zoster.  He was treated with acyclovir  and this has resolved.   He was admitted in 6/21 with bleeding from the site of the subxiphoid abscess as well as cellulitis.  He required multiple units of PRBCs.  He went to the OR initially for I&D.  Blood and wound cultures grew MSSA.  He then went back to the OR for rectus flap over wound site (plastics).  He was started on cefazolin  and rifabutin , has PICC.   He was admitted in 7/21 with GI bleeding, he had 6 units PRBCs.  EGD showed duodenal AVMs, treated with APC.  INR goal with warfarin lowered to 1.8-2.3.   Echo in 5/22 was a difficult study with EF 25-30%, mild LVH, aortic valve difficult to visualize but appears to open with each beat, mildly dilated RV with moderately decreased systolic function.  Echo in 7/23 was a very difficult study.  Echo in 5/24 showed EF 20-25% with midline interventricular septum, RV moderately dilated with moderately decreased systolic function, IVC  normal. Echo in 3/25 was technically difficult study, interventricular septum was poorly visualized but probably midline, RV not seen.   He returns for LVAD followup.   Occasional PI events, no low flows on LVAD interrogation.  He continues chronic Keflex  for driveline infection prophylaxis.  MAP stable. No significant exertional dyspnea.  No lightheadedness.  No BRBPR/melena. Weight down 5 lbs. Still smokes a few cigarettes/day.      Denies LVAD alarms.  Reports taking Coumadin  as prescribed and adherence to anticoagulation based dietary restrictions.  Denies bright red blood per rectum or melena, no dark urine or hematuria.  Gets monthly octreotide .   Labs (3/24): LDL 219, hgb 13.4, K 5.4, creatinine 1.54 => 1.43 Labs (5/24): hgb 13 Labs (6/24): K 3.7, creatinine 0.99 Labs (10/24): K 4.4, creatinine 1.3 Labs (11/24): hgb 14, K 4.4,  creatinine 1.41 Labs (3/25): hgb 14.1, K 4.3, creatinine 1.4, LFTs normal, LDH 214 Labs (5/25): hgb 13.3, LDH 194, K 3.8, creatinine 1.16, LFTs normal Labs (8/25): K 4, creatinine 1.18 Labs (9/25): hgb 13.6  PMH: 1. Degenerative disc disease.  2. GERD 3. Chronic systolic CHF:  Nonischemic cardiomyopathy.  Dilated cardiomyopathy diagnosed 6/19 in [place].  LHC/RHC in 7/19 showed elevated filling pressures, low cardiac output, and no significant CAD.   - Echo (9/19): Severe LV dilation with EF 10-20%, moderate-severe MR, severely dilated RV with mildly decreased systolic function, severe TR.  - Cardiac MRI (9/19): EF 14%, moderate dilated LV, severely dilated RV with mod-severe systolic function and EF 25%, nonspecific RV insertion site LGE.  - RHC (5/19) on milrinone  0.375: mean RA 8, PA 40/19, mean PCWP 12, PAPi 2.65, CI 2.71.  - Echo (9/19) on milrinone  and diuresed): EF 20-25% with moderate LV dilation, moderately dilated RV with mildly decreased systolic function, mild-moderate MR, moderate TR, PASP 51 mmHg.  Cannot rule out noncompaction.  - Heartmate 3 LVAD placement in 9/19.  - Echo (5/22): EF 25-30%, mild LVH, aortic valve difficult to visualize but appears to open with each beat, mildly dilated RV with moderately decreased systolic function. - Echo (5/24): EF 20-25% with midline interventricular septum, RV moderately dilated with moderately decreased systolic function, IVC normal.  - Echo (3/25): Technically difficult, RV not visualized, interventricular septum was probably midline.  4. OSA: CPAP use.  5. Prior smoker 6. Type 2 diabetes 7. Hyperlipidemia.  8. H/o NSVT 9. 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  10. RLE DVT: found in 9/19.  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 Subxiphoid abscess involving collection along driveline in 3/21, MSSA.  6/21 Rectus flap to cover abscess site.  12. Facial Zoster 3/21 13. 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   Current Outpatient Medications  Medication Sig Dispense Refill   acetaminophen  (TYLENOL ) 500 MG tablet Take 1,000 mg by mouth every 6 (six) hours as needed for mild pain.     carbamide peroxide (DEBROX) 6.5 % OTIC solution Place 5 drops into both ears 3 (three) times daily. 15 mL 0   cephALEXin  (KEFLEX ) 500 MG capsule Take 1 capsule (500 mg total) by mouth 3 (three) times daily. 270 capsule 3   cyanocobalamin  500 MCG tablet Take 1,000 mcg by mouth daily.      docusate sodium  (COLACE) 100 MG capsule Take 2 capsules (200 mg total) by mouth daily as needed for mild constipation. 10 capsule 0   gabapentin  (NEURONTIN ) 300 MG capsule Take 2 capsules (600 mg total) by mouth  4 (four) times daily. 240 capsule 3   hydrOXYzine  (ATARAX ) 50 MG tablet Take 1 tablet (50 mg total) by mouth 3 (three) times daily as needed for itching. 60 tablet 3   nystatin  powder Apply 1 Application topically 3 (three) times daily. 15 g 11   pantoprazole  (PROTONIX ) 40 MG tablet Take 1 tablet by mouth once daily 90 tablet 3   sildenafil  (VIAGRA ) 100 MG tablet Take 0.5 tablets (50 mg total) by mouth daily as needed for erectile dysfunction. 10 tablet 6   spironolactone  (ALDACTONE ) 25 MG tablet Take 1 tablet (25 mg total) by mouth daily. 90 tablet 3   thiamine  100 MG tablet Take 1 tablet (100 mg total) by mouth daily. 30 tablet 0   warfarin (COUMADIN ) 5 MG tablet Take 2.5 mg (5 mg x 0.5) every Tue; 5 mg (5 mg x 1) all other days, OR AS INSTRUCTED BY HF CLINIC 90 tablet 3   Zinc  Gluconate 100 MG TABS Take 1 tablet (100 mg total) by mouth daily. 90 tablet 3   zolpidem  (AMBIEN ) 5 MG tablet Take 1 tablet (5 mg total) by mouth at bedtime. 30 tablet 5   amLODipine  (NORVASC ) 5 MG tablet Take 2 tablets (10 mg total) by mouth daily. 180 tablet 3   fluticasone  (FLONASE ) 50 MCG/ACT nasal spray Place 1 spray into both nostrils 2 (two) times daily as needed for allergies. 9.9 mL 5    hydrALAZINE  (APRESOLINE ) 100 MG tablet Take 1 tablet (100 mg total) by mouth 3 (three) times daily. 270 tablet 3   Ipratropium-Albuterol  (COMBIVENT ) 20-100 MCG/ACT AERS respimat Inhale 1 puff into the lungs every 6 (six) hours as needed for wheezing or shortness of breath. 4 g 5   sacubitril -valsartan  (ENTRESTO ) 49-51 MG Take 1 tablet by mouth 2 (two) times daily. 180 tablet 3   No current facility-administered medications for this encounter.    Chlorhexidine , Atorvastatin , and Other  REVIEW OF SYSTEMS: All systems negative except as listed in HPI, PMH and Problem list.   LVAD INTERROGATION:  Please see LVAD nurse's note above for full interrogation.  I reviewed with nurse.   I reviewed the LVAD parameters from today, and compared the results to the patient's prior recorded data.  No programming changes were made.  The LVAD is functioning within specified parameters.  The patient performs LVAD self-test daily.  LVAD interrogation was negative for any significant power changes, alarms or PI events/speed drops.  LVAD equipment check completed and is in good working order.  Back-up equipment present.   LVAD education done on emergency procedures and precautions and reviewed exit site care.   BP (!) 110/0   Pulse 74   Wt 86.5 kg (190 lb 12.8 oz)   SpO2 99%   BMI 26.61 kg/m  MAP 91  Physical Exam: General: Well appearing this am. NAD.  HEENT: Normal. Neck: Supple, JVP 7-8 cm. Carotids OK.  Cardiac:  Mechanical heart sounds with LVAD hum present.  Lungs:  CTAB, normal effort.  Abdomen:  NT, ND, no HSM. No bruits or masses. +BS  LVAD exit site: Well-healed and incorporated. Dressing dry and intact. No erythema or drainage. Stabilization device present and accurately applied. Driveline dressing changed daily per sterile technique. Extremities:  Warm and dry. No cyanosis, clubbing, rash, or edema.  Neuro:  Alert & oriented x 3. Cranial nerves grossly intact. Moves all 4 extremities w/o  difficulty. Affect pleasant      ASSESSMENT AND PLAN:   1. Chronic systolic CHF:  Nonischemic cardiomyopathy, now s/p Heartmate 3 LVAD in 9/19.  Echo in 3/25 was difficult to interpret, but probably midline interventricular septum, no speed change. LVAD parameters reviewed personally and stable with a ongoing chronic PI events daily but no low flows.  NYHA class I, MAP is controlled.   Driveline site was examined and looks benign.  - BMET, LDH today.    - Continue spironolactone  25 mg daily.  - Continue Entresto  49/51 bid    - Continue hydralazine  100 mg tid.  - Off Coreg  with bradycardia and RV dysfunction.   - Continue amlodipine  10 mg daily.  - Continue warfarin for INR 1.8-2.3.    - Would be transplant candidate eventually if he can quit smoking.  Will make transplant clinic referral when he is off totally.  2. Smoking: Still smoking.  He tried Chantix  and Wellbutrin  but they did not work.  We discussed cessation again.  3. RLE DVT: On warfarin for LVAD. INR sent today.  4. OSA: Continue CPAP.   5. Type II diabetes: Per PCP.  6. 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  Site looks benign. - He is on long-term Keflex  for MSSA suppression.  Followup with ID.  8. 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 No overt bleeding. - CBC today.  - Off ASA, INR goal now 1.8-2.3.  - Continue monthly octreotide  injections.   Followup in 2 months    I spent 41 minutes reviewing chart, interacting with patient, reviewed LVAD parameters, and managing orders.   Ezra Shuck 08/16/2024

## 2024-08-18 ENCOUNTER — Encounter: Payer: Self-pay | Admitting: Neurology

## 2024-08-23 ENCOUNTER — Ambulatory Visit (HOSPITAL_COMMUNITY)
Admission: RE | Admit: 2024-08-23 | Discharge: 2024-08-23 | Disposition: A | Discharge status: Home / Self Care | Source: Ambulatory Visit | Attending: Cardiology | Admitting: Cardiology

## 2024-08-23 VITALS — BP 82/67 | HR 84 | Temp 97.2°F | Resp 18

## 2024-08-23 DIAGNOSIS — Z79818 Long term (current) use of other agents affecting estrogen receptors and estrogen levels: Secondary | ICD-10-CM | POA: Diagnosis not present

## 2024-08-23 DIAGNOSIS — Z95811 Presence of heart assist device: Secondary | ICD-10-CM | POA: Insufficient documentation

## 2024-08-23 DIAGNOSIS — K5521 Angiodysplasia of colon with hemorrhage: Secondary | ICD-10-CM | POA: Diagnosis present

## 2024-08-23 MED ORDER — OCTREOTIDE ACETATE 20 MG IM KIT
PACK | INTRAMUSCULAR | Status: AC
  Filled 2024-08-23: qty 1

## 2024-08-23 MED ORDER — OCTREOTIDE ACETATE 20 MG IM KIT
20.0000 mg | PACK | Freq: Once | INTRAMUSCULAR | Status: AC
  Administered 2024-08-23: 20 mg via INTRAMUSCULAR

## 2024-08-24 LAB — POCT INR: INR: 2.1 (ref 2.0–3.0)

## 2024-08-25 ENCOUNTER — Ambulatory Visit (HOSPITAL_COMMUNITY): Payer: Self-pay | Admitting: Pharmacist

## 2024-08-25 DIAGNOSIS — Z95811 Presence of heart assist device: Secondary | ICD-10-CM

## 2024-09-01 ENCOUNTER — Ambulatory Visit (HOSPITAL_COMMUNITY): Payer: Self-pay | Admitting: Pharmacist

## 2024-09-01 ENCOUNTER — Telehealth (HOSPITAL_COMMUNITY): Payer: Self-pay | Admitting: Pharmacist

## 2024-09-01 DIAGNOSIS — Z95811 Presence of heart assist device: Secondary | ICD-10-CM

## 2024-09-01 LAB — POCT INR: INR: 1.9 — AB (ref 2.0–3.0)

## 2024-09-01 NOTE — Telephone Encounter (Signed)
 Left voicemail for reminder of INR check due 08/31/24.

## 2024-09-07 ENCOUNTER — Ambulatory Visit (HOSPITAL_COMMUNITY): Payer: Self-pay | Admitting: Pharmacist

## 2024-09-07 DIAGNOSIS — Z95811 Presence of heart assist device: Secondary | ICD-10-CM

## 2024-09-07 LAB — POCT INR: INR: 2.1 (ref 2.0–3.0)

## 2024-09-09 LAB — OPHTHALMOLOGY REPORT-SCANNED

## 2024-09-14 ENCOUNTER — Ambulatory Visit (HOSPITAL_COMMUNITY): Payer: Self-pay | Admitting: Pharmacist

## 2024-09-14 DIAGNOSIS — Z95811 Presence of heart assist device: Secondary | ICD-10-CM

## 2024-09-14 LAB — POCT INR: INR: 2 (ref 2.0–3.0)

## 2024-09-22 ENCOUNTER — Ambulatory Visit (HOSPITAL_COMMUNITY)
Admission: RE | Admit: 2024-09-22 | Discharge: 2024-09-22 | Disposition: A | Discharge status: Home / Self Care | Source: Ambulatory Visit | Attending: Cardiology | Admitting: Cardiology

## 2024-09-22 ENCOUNTER — Other Ambulatory Visit (HOSPITAL_COMMUNITY): Payer: Self-pay | Admitting: Unknown Physician Specialty

## 2024-09-22 VITALS — BP 88/75 | HR 95 | Temp 98.3°F | Resp 17

## 2024-09-22 DIAGNOSIS — Z95811 Presence of heart assist device: Secondary | ICD-10-CM | POA: Diagnosis present

## 2024-09-22 DIAGNOSIS — I5022 Chronic systolic (congestive) heart failure: Secondary | ICD-10-CM

## 2024-09-22 DIAGNOSIS — K5521 Angiodysplasia of colon with hemorrhage: Secondary | ICD-10-CM | POA: Diagnosis present

## 2024-09-22 MED ORDER — OCTREOTIDE ACETATE 20 MG IM KIT
20.0000 mg | PACK | Freq: Once | INTRAMUSCULAR | Status: AC
  Administered 2024-09-22: 20 mg via INTRAMUSCULAR

## 2024-09-22 MED ORDER — SACUBITRIL-VALSARTAN 49-51 MG PO TABS: 1.0000 | ORAL_TABLET | Freq: Two times a day (BID) | ORAL | 3 refills | Status: AC

## 2024-09-22 MED ORDER — OCTREOTIDE ACETATE 20 MG IM KIT
PACK | INTRAMUSCULAR | Status: AC
Start: 2024-09-22 — End: 2024-09-22
  Filled 2024-09-22: qty 1

## 2024-09-28 ENCOUNTER — Ambulatory Visit (HOSPITAL_COMMUNITY): Payer: Self-pay | Admitting: Pharmacist

## 2024-09-28 ENCOUNTER — Encounter: Payer: Self-pay | Admitting: Infectious Diseases

## 2024-09-28 ENCOUNTER — Ambulatory Visit: Admitting: Infectious Diseases

## 2024-09-28 ENCOUNTER — Other Ambulatory Visit: Payer: Self-pay

## 2024-09-28 VITALS — Temp 97.2°F | Ht 71.0 in | Wt 191.0 lb

## 2024-09-28 DIAGNOSIS — Z23 Encounter for immunization: Secondary | ICD-10-CM

## 2024-09-28 DIAGNOSIS — T827XXS Infection and inflammatory reaction due to other cardiac and vascular devices, implants and grafts, sequela: Secondary | ICD-10-CM

## 2024-09-28 DIAGNOSIS — Z7185 Encounter for immunization safety counseling: Secondary | ICD-10-CM | POA: Diagnosis not present

## 2024-09-28 DIAGNOSIS — Z95811 Presence of heart assist device: Secondary | ICD-10-CM | POA: Diagnosis not present

## 2024-09-28 DIAGNOSIS — B9561 Methicillin susceptible Staphylococcus aureus infection as the cause of diseases classified elsewhere: Secondary | ICD-10-CM

## 2024-09-28 DIAGNOSIS — T827XXD Infection and inflammatory reaction due to other cardiac and vascular devices, implants and grafts, subsequent encounter: Secondary | ICD-10-CM

## 2024-09-28 LAB — POCT INR: INR: 2.1 (ref 2.0–3.0)

## 2024-09-28 MED ORDER — CEFADROXIL 500 MG PO CAPS: 500.0000 mg | ORAL_CAPSULE | Freq: Two times a day (BID) | ORAL | 11 refills | Status: DC

## 2024-09-28 NOTE — Progress Notes (Signed)
 Patient: Theodore Welch  DOB: [birth year] MRN: [id number] PCP: [facility]    ID Hx: Theodore Welch is a 63 y.o. 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   January-2020: MSSA infection of counter incision to mid abdomen; s/p serial debridements. Initially did not involve exit site at this time. Received IV Cefazolin  x 4 weeks --> PO Cephalexin  x 4 weeks.  May-2020: recurrent MSSA infection, now involving driveline exit site and secondary bacteremia. TEE deferred due to low sensitivity for detecting VAD endocarditis. 6 weeks IV Cefazolin  s/p abd wall debridement --> cephalexin  for chronic suppression for duration pump is in place.   March-2021 sudden onset subxiphoid abscess involving proximal driveline, MSSA on cultures. Blood cultures negative. S/P I&D of abdominal wall exit site and subxiphoid space. IV cefazolin  x 8 weeks (May 10th 2021).  (to cover for possible sternal osteomyelitis concern) with plans for resuming chronic suppression with cephalexin .   Apr 16, 2020 - readmitted following re-opening and bleeding of subxiphoid wound. +MSSA and taken for debridement 6/01. Required repair of outflow graft at this surgery as well (?sharp puncture vs degenerated graft d/t infection). Vertical Rectus Flap repair performed with serial debridements. BC (-). PICC Cefazolin  + Rifabutin  planned through 7/7  April 27, 2020 - bleeding from sternum r/t subxiphoid abscess --> debridement in OR, Cx again grew out MSSA. Back on IV cefazolin  for treatment.   March 2022 - enterobacter cloacae growing from non-healing soft tissue wound. Added 2 weeks cipro  to treat --> Resolved. Has continued on cephalexin .   November 2025 - switched to cefadroxil 500 BID for longer half life and easier dosing scheme.     Subjective   Chief Complaint  Patient presents with   Follow-up    LVAD  on cephalexin      Discussed the use of AI scribe software for clinical note transcription with the patient, who gave verbal consent to proceed.  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 He is accompanied by his wife, Newell.  He has been on cephalexin  500 mg three times a day since March 2025 and is doing well with this regimen. He takes his doses in the morning, evening, and before bed, aligning them with his warfarin schedule. He manages his medication schedule effectively and finds it manageable.  He receives monthly octreotide  injections, with the last injection on September 22, 2024. He has a history of gastrointestinal bleeding and blood clots. He is diligent about his medical appointments and medication schedule.  He is due for his flu shot and COVID booster, with his last COVID booster approximately seven months ago.  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        Review of Systems  Constitutional:  Negative for chills and fever.  HENT:  Negative for tinnitus.   Eyes:  Negative for blurred vision and photophobia.  Respiratory:  Negative for cough and sputum production.   Cardiovascular:  Negative for chest pain.  Gastrointestinal:  Negative for diarrhea, nausea and vomiting.  Genitourinary:  Negative for dysuria.  Skin:  Negative for rash.  Neurological:  Negative for headaches.      Past Medical History:  Diagnosis Date   Abscess 01/2020   sternal abscess   Cardiomyopathy, unspecified (HCC)    CHF (congestive heart failure) (HCC)    Chronic back pain    Diabetes mellitus without complication (HCC)    Enlarged heart    Gastric ulcer    Gastroenteritis    H/O degenerative disc disease    Hypertension    LVAD (left ventricular assist device)  present [facility])     Outpatient Medications Prior to Visit  Medication Sig Dispense Refill   acetaminophen  (TYLENOL ) 500 MG tablet Take 1,000 mg by mouth every 6 (six) hours as needed for mild pain.     amLODipine  (NORVASC ) 5 MG tablet Take 2 tablets (10 mg total) by mouth daily. 180 tablet 3   carbamide peroxide (DEBROX) 6.5 % OTIC solution Place 5 drops into both ears 3 (three) times daily. 15 mL 0   cyanocobalamin  500 MCG tablet Take 1,000 mcg by mouth daily.      docusate sodium  (COLACE) 100 MG capsule Take 2 capsules (200 mg total) by mouth daily as needed for mild constipation. 10 capsule 0   fluticasone  (FLONASE ) 50 MCG/ACT nasal spray Place 1 spray into both nostrils 2 (two) times daily as needed for allergies. 9.9 mL 5   gabapentin  (NEURONTIN ) 300 MG capsule Take 2 capsules (600 mg total) by mouth 4 (four) times daily. 240 capsule 3   hydrALAZINE  (APRESOLINE ) 100 MG tablet Take 1 tablet (100 mg total) by mouth 3 (three) times daily. 270 tablet 3   hydrOXYzine  (ATARAX ) 50 MG tablet Take 1 tablet (50 mg total) by mouth 3 (three) times daily as needed for itching. 60 tablet 3   Ipratropium-Albuterol  (COMBIVENT ) 20-100 MCG/ACT AERS respimat Inhale 1 puff into the lungs every 6 (six) hours as needed for wheezing or shortness of breath. 4 g 5   nystatin  powder Apply 1 Application topically 3 (three) times daily. 15 g 11   pantoprazole  (PROTONIX ) 40 MG tablet Take 1 tablet by mouth once daily 90 tablet 3   sacubitril -valsartan  (ENTRESTO ) 49-51 MG Take 1 tablet by mouth 2 (two) times daily. 180 tablet 3   sildenafil  (VIAGRA ) 100 MG tablet Take 0.5 tablets (50 mg total) by mouth daily as needed for erectile dysfunction. 10 tablet 6   spironolactone  (ALDACTONE ) 25 MG tablet Take 1 tablet (25 mg total) by mouth daily. 90 tablet 3   thiamine  100 MG tablet Take 1 tablet (100 mg total) by mouth daily. 30 tablet 0   warfarin (COUMADIN ) 5 MG tablet Take 2.5 mg (5 mg x 0.5) every Tue; 5 mg (5 mg x 1) all  other days, OR AS INSTRUCTED BY HF CLINIC 90 tablet 3   Zinc  Gluconate 100 MG TABS Take 1 tablet (100 mg total) by mouth daily. 90 tablet 3   zolpidem  (AMBIEN ) 5 MG tablet Take 1 tablet (5 mg total) by mouth at bedtime. 30 tablet 5   cephALEXin  (KEFLEX ) 500 MG capsule Take 1 capsule (500 mg total) by mouth 3 (three) times daily. 270 capsule 3   No facility-administered medications prior to visit.     Allergies  Allergen Reactions   Chlorhexidine  Rash    Blisters   Atorvastatin  Other (See Comments)   Other Rash    Prep pads      Objective   Objective:   Vitals:   09/28/24 0854  Temp: (!) 97.2 F (36.2 C)  TempSrc: Temporal  SpO2: 100%  Weight: 191 lb (86.6 kg)  Height: 5' 11 (  1.803 m)     Body mass index is 26.64 kg/m.   Physical Exam Vitals reviewed.  Constitutional:      Appearance: Normal appearance. He is not ill-appearing.  Eyes:     General: No scleral icterus.    Pupils: Pupils are equal, round, and reactive to light.  Cardiovascular:     Rate and Rhythm: Normal rate.     Comments: LVAD Humm +  Pulmonary:     Effort: Pulmonary effort is normal.  Abdominal:     Palpations: Abdomen is soft.     Tenderness: There is no abdominal tenderness.     Comments: Driveline dressing clean and dry. Other abdominal incisions completely healed over and open to air.   Skin:    General: Skin is warm and dry.     Capillary Refill: Capillary refill takes less than 2 seconds.  Neurological:     Mental Status: He is alert and oriented to person, place, and time.      Lab Results: Lab Results  Component Value Date   WBC 10.3 08/16/2024   HGB 13.6 08/16/2024   HCT 41.6 08/16/2024   MCV 84.9 08/16/2024   PLT 221 08/16/2024    Lab Results  Component Value Date   CREATININE 1.44 (H) 08/16/2024   BUN 24 (H) 08/16/2024   NA 134 (L) 08/16/2024   K 4.4 08/16/2024   CL 105 08/16/2024   CO2 18 (L) 08/16/2024    Lab Results  Component Value Date   ALT 20  08/16/2024   AST 24 08/16/2024   ALKPHOS 99 08/16/2024   BILITOT 0.8 08/16/2024    Sed Rate (mm/hr)  Date Value  05/28/2021 4  03/27/2021 1  01/16/2021 1   CRP (mg/dL)  Date Value  92/88/7977 0.6  03/27/2021 0.7  01/16/2021 0.7       Assessment & Plan:     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 Currently on cephalexin  500 mg TID. Discussed switching to cefadroxil for twice-daily dosing due to its longer half-life, which allows for less frequent dosing without compromising efficacy. I have 100% confidence in the switch for him. Cefadroxil covers the same pathogens as Keflex  and will not make any significant impact to affect INR levels. - Finish current bottle of cephalexin  (Keflex ). - Switch to cefadroxil (Duricef) twice daily after current supply is finished. - Ensure cefadroxil is taken with Coumadin , maintaining a 12-hour interval between doses.  Immunization counseling and administration (COVID-19 and influenza) - He is due for a flu shot and a COVID-19 booster. Last COVID booster was approximately seven months ago. He is agreeable to receiving both vaccinations today. - Administered flu shot and COVID-19 booster today.  Visit duration: 16 minutes           Orders Placed This Sport And Exercise Psychologist Vaccine 75yrs & older   Flu vaccine HIGH DOSE PF(Fluzone Trivalent)   Meds ordered this encounter  Medications   cefadroxil (DURICEF) 500 MG capsule    Sig: Take 1 capsule (500 mg total) by mouth 2 (two) times daily.    Dispense:  60 capsule    Refill:  11    Switching from cephalexin , this will be a chronic medication to suppress infected hardware, continue to refill   Return in about 6 months (around 03/28/2025).   [name], MSN, NP-C [facility]  [facility]  [email or web address] Pager: [phone] Office: [phone] [facility] Main Line: [phone]

## 2024-09-28 NOTE — Patient Instructions (Addendum)
 Always a pleasure to see you   Fleurette out the cephalexin  bottle you have now.   THEN we will switch you to a easier to take antibiotic that is in the same drug class.   New medication name is Cefadroxil (Duricef) - take one capsule in the morning and one in the evening about 12 hours apart.   Call me if you have any concerns but I suspect you will have zero issues with this.

## 2024-10-05 ENCOUNTER — Ambulatory Visit (HOSPITAL_COMMUNITY): Payer: Self-pay | Admitting: Pharmacist

## 2024-10-05 DIAGNOSIS — Z95811 Presence of heart assist device: Secondary | ICD-10-CM

## 2024-10-05 LAB — POCT INR: INR: 1.9 — AB (ref 2.0–3.0)

## 2024-10-11 ENCOUNTER — Other Ambulatory Visit (HOSPITAL_COMMUNITY): Payer: Self-pay | Admitting: *Deleted

## 2024-10-11 DIAGNOSIS — I10 Essential (primary) hypertension: Secondary | ICD-10-CM

## 2024-10-11 DIAGNOSIS — Z95811 Presence of heart assist device: Secondary | ICD-10-CM

## 2024-10-11 DIAGNOSIS — I5022 Chronic systolic (congestive) heart failure: Secondary | ICD-10-CM

## 2024-10-11 MED ORDER — AMLODIPINE BESYLATE 5 MG PO TABS: 10.0000 mg | ORAL_TABLET | Freq: Every day | ORAL | 3 refills | Status: AC

## 2024-10-12 ENCOUNTER — Ambulatory Visit (HOSPITAL_COMMUNITY): Payer: Self-pay | Admitting: Pharmacist

## 2024-10-12 DIAGNOSIS — Z95811 Presence of heart assist device: Secondary | ICD-10-CM

## 2024-10-12 LAB — POCT INR: INR: 2 (ref 2.0–3.0)

## 2024-10-13 ENCOUNTER — Other Ambulatory Visit (HOSPITAL_COMMUNITY): Payer: Self-pay | Admitting: *Deleted

## 2024-10-13 DIAGNOSIS — Z7901 Long term (current) use of anticoagulants: Secondary | ICD-10-CM

## 2024-10-13 DIAGNOSIS — Z95811 Presence of heart assist device: Secondary | ICD-10-CM

## 2024-10-13 DIAGNOSIS — I5022 Chronic systolic (congestive) heart failure: Secondary | ICD-10-CM

## 2024-10-18 ENCOUNTER — Ambulatory Visit (HOSPITAL_COMMUNITY): Payer: Self-pay | Admitting: Pharmacist

## 2024-10-18 ENCOUNTER — Ambulatory Visit (HOSPITAL_COMMUNITY)
Admission: RE | Admit: 2024-10-18 | Discharge: 2024-10-18 | Disposition: A | Source: Ambulatory Visit | Attending: Cardiology | Admitting: Cardiology

## 2024-10-18 ENCOUNTER — Ambulatory Visit (HOSPITAL_COMMUNITY): Payer: Self-pay | Admitting: Cardiology

## 2024-10-18 DIAGNOSIS — Z7901 Long term (current) use of anticoagulants: Secondary | ICD-10-CM | POA: Diagnosis not present

## 2024-10-18 DIAGNOSIS — K7460 Unspecified cirrhosis of liver: Secondary | ICD-10-CM | POA: Insufficient documentation

## 2024-10-18 DIAGNOSIS — Z79899 Other long term (current) drug therapy: Secondary | ICD-10-CM | POA: Insufficient documentation

## 2024-10-18 DIAGNOSIS — I10 Essential (primary) hypertension: Secondary | ICD-10-CM

## 2024-10-18 DIAGNOSIS — E119 Type 2 diabetes mellitus without complications: Secondary | ICD-10-CM | POA: Diagnosis not present

## 2024-10-18 DIAGNOSIS — B9561 Methicillin susceptible Staphylococcus aureus infection as the cause of diseases classified elsewhere: Secondary | ICD-10-CM | POA: Diagnosis not present

## 2024-10-18 DIAGNOSIS — Z86718 Personal history of other venous thrombosis and embolism: Secondary | ICD-10-CM | POA: Insufficient documentation

## 2024-10-18 DIAGNOSIS — G4733 Obstructive sleep apnea (adult) (pediatric): Secondary | ICD-10-CM | POA: Insufficient documentation

## 2024-10-18 DIAGNOSIS — I159 Secondary hypertension, unspecified: Secondary | ICD-10-CM

## 2024-10-18 DIAGNOSIS — I428 Other cardiomyopathies: Secondary | ICD-10-CM | POA: Insufficient documentation

## 2024-10-18 DIAGNOSIS — Z95811 Presence of heart assist device: Secondary | ICD-10-CM | POA: Diagnosis not present

## 2024-10-18 DIAGNOSIS — I5022 Chronic systolic (congestive) heart failure: Secondary | ICD-10-CM | POA: Diagnosis present

## 2024-10-18 DIAGNOSIS — L299 Pruritus, unspecified: Secondary | ICD-10-CM

## 2024-10-18 DIAGNOSIS — F17210 Nicotine dependence, cigarettes, uncomplicated: Secondary | ICD-10-CM | POA: Insufficient documentation

## 2024-10-18 LAB — BASIC METABOLIC PANEL WITH GFR
Anion gap: 12 (ref 5–15)
BUN: 16 mg/dL (ref 8–23)
CO2: 24 mmol/L (ref 22–32)
Calcium: 9.6 mg/dL (ref 8.9–10.3)
Chloride: 104 mmol/L (ref 98–111)
Creatinine, Ser: 1.41 mg/dL — ABNORMAL HIGH (ref 0.61–1.24)
GFR, Estimated: 55 mL/min — ABNORMAL LOW (ref >60)
Glucose, Bld: 150 mg/dL — ABNORMAL HIGH (ref 70–99)
Potassium: 4.3 mmol/L (ref 3.5–5.1)
Sodium: 140 mmol/L (ref 135–145)

## 2024-10-18 LAB — LACTATE DEHYDROGENASE: LDH: 212 U/L (ref 105–235)

## 2024-10-18 LAB — CBC
HCT: 44.2 % (ref 39.0–52.0)
Hemoglobin: 14.4 g/dL (ref 13.0–17.0)
MCH: 28.5 pg (ref 26.0–34.0)
MCHC: 32.6 g/dL (ref 30.0–36.0)
MCV: 87.4 fL (ref 80.0–100.0)
Platelets: 226 K/uL (ref 150–400)
RBC: 5.06 MIL/uL (ref 4.22–5.81)
RDW: 14.6 % (ref 11.5–15.5)
WBC: 8.3 K/uL (ref 4.0–10.5)
nRBC: 0 % (ref 0.0–0.2)

## 2024-10-18 LAB — PROTIME-INR
INR: 1.3 — ABNORMAL HIGH (ref 0.8–1.2)
Prothrombin Time: 17.3 s — ABNORMAL HIGH (ref 11.4–15.2)

## 2024-10-18 MED ORDER — SPIRONOLACTONE 25 MG PO TABS: 25.0000 mg | ORAL_TABLET | Freq: Every day | ORAL | 3 refills | Status: AC

## 2024-10-18 MED ORDER — HYDRALAZINE HCL 100 MG PO TABS: 100.0000 mg | ORAL_TABLET | Freq: Three times a day (TID) | ORAL | 3 refills | Status: AC

## 2024-10-18 MED ORDER — HYDROXYZINE HCL 50 MG PO TABS: 50.0000 mg | ORAL_TABLET | Freq: Three times a day (TID) | ORAL | 3 refills | Status: DC | PRN

## 2024-10-18 NOTE — Progress Notes (Signed)
 HF cardiologist: Dr. Rolan  Chief complaint: LVAD followup  63 y.o. with history of nonischemic cardiomyopathy, RLE DVT, cirrhosis, smoking, and OSA returns for followup of CHF/LVAD placement.  Cardiomyopathy was diagnosed in 6/19 in [place], [place] at that time showed low output.  He was admitted to [facility] in 9/19 with low output HF and was started on milrinone  and diuresed.  Unable to wean off milrinone .  He had a degree of RV failure, but this improved on milrinone .  Valvular heart disease also looked better with milrinone  and diuresis.  On 08/03/18, he had Heartmate 3 LVAD placed.  Speed was optimized by ramp echo post-op.  Post-op course was relatively unremarkable.  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  No abscess on CT.  He was started on cefazolin  IV for 6 wks.  BP-active meds decreased with low MAP.   He was admitted in 3/21 with subxiphoid abscess and cellulitis.  CT showed collection along the course of the driveline.  There was no evidence for sternal osteomyelitis.  Abscess was debrided, MSSA grew from wound cultures.  Wound vac was placed.  Patient was started on cefazolin , which was recently stopped after completing 8 wks.   Patient was re-admitted later in 3/21 with facial Zoster.  He was treated with acyclovir  and this has resolved.   He was admitted in 6/21 with bleeding from the site of the subxiphoid abscess as well as cellulitis.  He required multiple units of PRBCs.  He went to the OR initially for I&D.  Blood and wound cultures grew MSSA.  He then went back to the OR for rectus flap over wound site (plastics).  He was started on cefazolin  and rifabutin , has PICC.   He was admitted in 7/21 with GI bleeding, he had 6 units PRBCs.  EGD showed duodenal AVMs, treated with APC.  INR goal with warfarin lowered to 1.8-2.3.   Echo in 5/22 was a difficult study with EF 25-30%, mild LVH, aortic valve  difficult to visualize but appears to open with each beat, mildly dilated RV with moderately decreased systolic function.  Echo in 7/23 was a very difficult study.  Echo in 5/24 showed EF 20-25% with midline interventricular septum, RV moderately dilated with moderately decreased systolic function, IVC normal. Echo in 3/25 was technically difficult study, interventricular septum was poorly visualized but probably midline, RV not seen.   He returns for LVAD followup.   Weight down 2 lbs.  BP stable.  Getting octreotide  regularly, no BRBPR/melena.  Chronic frequent PI events on device interrogation though less than in the past.  No significant dyspnea with ADLs.  Thinks he can do anything he wants but play football.  Gets a little tired walking a long distance.  No lightheadedness.  Still smokes a few cigarettes/day.      Denies LVAD alarms.  Reports taking Coumadin  as prescribed and adherence to anticoagulation based dietary restrictions.    Labs (3/24): LDL 219, hgb 13.4, K 5.4, creatinine 1.54 => 1.43 Labs (5/24): hgb 13 Labs (6/24): K 3.7, creatinine 0.99 Labs (10/24): K 4.4, creatinine 1.3 Labs (11/24): hgb 14, K 4.4, creatinine 1.41 Labs (3/25): hgb 14.1, K 4.3, creatinine 1.4, LFTs normal, LDH 214 Labs (5/25): hgb 13.3, LDH 194, K 3.8, creatinine 1.16, LFTs normal Labs (8/25): K 4, creatinine 1.18 Labs (9/25): hgb 13.6, K 4.4, creatinine 1.44, LDH 205  LVAD Documentation    10/18/2024  Device Info  LVAD Type: Heartmate III  Date of Implant: 07/24/2018  Therapy Type: Destination Therapy      10/18/2024  Vitals  Heart Rate: 76 BPM  Automatic BP: 89/74  Doppler MAP: 88 mmHg  SpO2: 99 %    Last 3 Weights Weight Weight  10/18/2024 85.458 kg 188 lb 6.4 oz  09/28/2024 86.637 kg 191 lb  08/16/2024 86.546 kg 190 lb 12.8 oz       10/18/2024  LVAD Paramaters  Speed: 5800 RPM  Flow: 5 LPM  PI: 5  Power: 5 Watts  Hematocrit: 42 %  Alarms: none  Events: 50+  Last Speed Change Date:  03/27/2021  Last Ramp Echo Date: 08/10/2018  Last Right Heart Cath Date: 07/24/2018  Bleeding History: Yes  Type of Bleeding Hx: GI Bleed  Type of Dressing: Weekly  Annual Maintenance Date: 07/24/2025    Labs    Units 10/18/24 1001 10/12/24 0000 10/05/24 0000 08/24/24 0000 08/16/24 0956 06/23/24 0000 06/18/24 1313  INR  1.3* 2.0 1.9*   < > 1.6*   < > 1.9*  LDH U/L 212  --   --   --  205*  --  180  HGB g/dL 85.5  --   --   --  86.3  --  12.9*  CREATININE mg/dL 8.58*  --   --   --  8.55*  --  1.18   < > = values in this interval not displayed.        PMH: 1. Degenerative disc disease.  2. GERD 3. Chronic systolic CHF:  Nonischemic cardiomyopathy.  Dilated cardiomyopathy diagnosed 6/19 in [place].  LHC/RHC in 7/19 showed elevated filling pressures, low cardiac output, and no significant CAD.   - Echo (9/19): Severe LV dilation with EF 10-20%, moderate-severe MR, severely dilated RV with mildly decreased systolic function, severe TR.  - Cardiac MRI (9/19): EF 14%, moderate dilated LV, severely dilated RV with mod-severe systolic function and EF 25%, nonspecific RV insertion site LGE.  - RHC (5/19) on milrinone  0.375: mean RA 8, PA 40/19, mean PCWP 12, PAPi 2.65, CI 2.71.  - Echo (9/19) on milrinone  and diuresed): EF 20-25% with moderate LV dilation, moderately dilated RV with mildly decreased systolic function, mild-moderate MR, moderate TR, PASP 51 mmHg.  Cannot rule out noncompaction.  - Heartmate 3 LVAD placement in 9/19.  - Echo (5/22): EF 25-30%, mild LVH, aortic valve difficult to visualize but appears to open with each beat, mildly dilated RV with moderately decreased systolic function. - Echo (5/24): EF 20-25% with midline interventricular septum, RV moderately dilated with moderately decreased systolic function, IVC normal.  - Echo (3/25): Technically difficult, RV not visualized, interventricular septum was probably midline.  4. OSA: CPAP use.  5. Prior smoker 6. Type 2  diabetes 7. Hyperlipidemia.  8. H/o NSVT 9. 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  10. RLE DVT: found in 9/19.  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 Subxiphoid abscess involving collection along driveline in 3/21, MSSA.  6/21 Rectus flap to cover abscess site.  12. Facial Zoster 3/21 13. 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   Current Outpatient Medications  Medication Sig Dispense Refill   acetaminophen  (TYLENOL ) 500 MG tablet Take 1,000 mg by mouth every 6 (six) hours as needed for mild pain.     amLODipine  (NORVASC ) 5 MG tablet Take 2 tablets (10 mg total) by mouth daily. 180 tablet 3  carbamide peroxide (DEBROX) 6.5 % OTIC solution Place 5 drops into both ears 3 (three) times daily. 15 mL 0   cefadroxil  (DURICEF) 500 MG capsule Take 1 capsule (500 mg total) by mouth 2 (two) times daily. 60 capsule 11   cyanocobalamin  500 MCG tablet Take 1,000 mcg by mouth daily.      docusate sodium  (COLACE) 100 MG capsule Take 2 capsules (200 mg total) by mouth daily as needed for mild constipation. 10 capsule 0   fluticasone  (FLONASE ) 50 MCG/ACT nasal spray Place 1 spray into both nostrils 2 (two) times daily as needed for allergies. 9.9 mL 5   gabapentin  (NEURONTIN ) 300 MG capsule Take 2 capsules (600 mg total) by mouth 4 (four) times daily. 240 capsule 3   Ipratropium-Albuterol  (COMBIVENT ) 20-100 MCG/ACT AERS respimat Inhale 1 puff into the lungs every 6 (six) hours as needed for wheezing or shortness of breath. 4 g 5   nystatin  powder Apply 1 Application topically 3 (three) times daily. 15 g 11   pantoprazole  (PROTONIX ) 40 MG tablet Take 1 tablet by mouth once daily 90 tablet 3   sacubitril -valsartan  (ENTRESTO ) 49-51 MG Take 1 tablet by mouth 2 (two) times daily. 180 tablet 3   sildenafil  (VIAGRA ) 100 MG tablet Take 0.5 tablets (50 mg total) by mouth daily as needed for erectile dysfunction. 10 tablet 6   thiamine   100 MG tablet Take 1 tablet (100 mg total) by mouth daily. 30 tablet 0   warfarin (COUMADIN ) 5 MG tablet Take 2.5 mg (5 mg x 0.5) every Tue; 5 mg (5 mg x 1) all other days, OR AS INSTRUCTED BY HF CLINIC 90 tablet 3   Zinc  Gluconate 100 MG TABS Take 1 tablet (100 mg total) by mouth daily. 90 tablet 3   zolpidem  (AMBIEN ) 5 MG tablet Take 1 tablet (5 mg total) by mouth at bedtime. 30 tablet 5   hydrALAZINE  (APRESOLINE ) 100 MG tablet Take 1 tablet (100 mg total) by mouth 3 (three) times daily. 270 tablet 3   hydrOXYzine  (ATARAX ) 50 MG tablet Take 1 tablet (50 mg total) by mouth 3 (three) times daily as needed for itching. 60 tablet 3   spironolactone  (ALDACTONE ) 25 MG tablet Take 1 tablet (25 mg total) by mouth daily. 90 tablet 3   No current facility-administered medications for this encounter.    Chlorhexidine , Atorvastatin , and Other  REVIEW OF SYSTEMS: All systems negative except as listed in HPI, PMH and Problem list.  BP (!) 88/0   Pulse 76   Wt 85.5 kg (188 lb 6.4 oz)   SpO2 99%   BMI 26.28 kg/m  MAP 81  Physical Exam: General: Well appearing this am. NAD.  HEENT: Normal. Neck: Supple, JVP 7-8 cm. Carotids OK.  Cardiac:  Mechanical heart sounds with LVAD hum present.  Lungs:  CTAB, normal effort.  Abdomen:  NT, ND, no HSM. No bruits or masses. +BS  LVAD exit site: Well-healed and incorporated. Dressing dry and intact. No erythema or drainage. Stabilization device present and accurately applied. Driveline dressing changed daily per sterile technique. Extremities:  Warm and dry. No cyanosis, clubbing, rash, or edema.  Neuro:  Alert & oriented x 3. Cranial nerves grossly intact. Moves all 4 extremities w/o difficulty. Affect pleasant      ASSESSMENT AND PLAN:   1. 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  Echo in 3/25 was difficult to interpret, but probably midline interventricular septum, no speed change. LVAD parameters reviewed  personally and stable with chronic frequent PI events (less than in the past) but no low flows.  NYHA class I, MAP not elevated.  Driveline site was examined and looks benign.  - BMET, LDH today.    - Continue spironolactone  25 mg daily.  - Continue Entresto  49/51 bid    - Continue hydralazine  100 mg tid.  - Off Coreg  with bradycardia and RV dysfunction.   - Continue amlodipine  10 mg daily.  - Continue warfarin for INR 1.8-2.3.    - Would be transplant candidate eventually if he can quit smoking.  Will make transplant clinic referral when he is off totally.  2. Smoking: Still smoking.  He tried Chantix  and Wellbutrin  but they did not work.  We discussed cessation again.  3. RLE DVT: On warfarin for LVAD. INR sent today.  4. OSA: Continue CPAP.   5. Type II diabetes: Per PCP.  6. 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  Site looks benign. - He is on long-term cefadroxil  for MSSA suppression.  Followup with ID.  8. 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 No overt bleeding. - CBC today.  - Off ASA, INR goal now 1.8-2.3.  - Continue monthly octreotide  injections.   Followup in 2 months    I spent 42 minutes reviewing chart, interacting with patient, reviewed LVAD parameters, and managing orders.   Ezra Shuck 10/18/2024

## 2024-10-18 NOTE — Progress Notes (Signed)
 Patient presents for 2 mo f/u in [facility] Clinic today with his wife Newell. Reports no problems with VAD equipment or concerns with drive line.  Pt reports he has been feeling good.  Denies lightheadedness, dizziness, falls, heart failure symptoms or signs of bleeding.   Reports he has been doing physical therapy for his back pain.   Newell is changing his drive line dressing weekly using daily kit. Pt reports no issues with drive line. Keflex  switched to Cefadroxil  per ID.  Receiving monthly Octreotide  injections.   Smoking cessation encouraged by Dr. Rolan.  Vital Signs: Doppler Pressure: 88 Automatic BP: 89/74 (81) HR: 76 SR SPO2: 99% RA   Weight: 188 lbs w/o equipment Last weight: 195 lbs w/o equipment  VAD Indication: Destination Therapy due to smoking status   VAD interrogation & Equipment Management: Speed: 5800 Flow: 4.8 Power: 5.0 w    PI: 5.2 Hct: 42   Alarms: none Events: 50+ PI Events  Fixed speed: 5800 Low speed limit: 5500  Primary Controller:   Replace back up battery in 20 months. Back up controller:   Replace back up battery in 21 months.  Annual Equipment Maintenance on UBC/PM was performed on 08/16/24.  I reviewed the LVAD parameters from today and compared the results to the patient's prior recorded data. LVAD interrogation was NEGATIVE for significant power changes, NEGATIVE for clinical alarms and NEGATIVE for PI events/speed drops. No programming changes were made and pump is functioning within specified parameters. Pt is performing daily controller and system monitor self tests along with completing weekly and monthly maintenance for LVAD equipment.  LVAD equipment check completed and is in good working order. Back-up equipment present.   Exit Site Care: Drive line is being maintained weekly by [name]. Dressing CDI. Existing VAD dressing removed and site care performed using sterile technique. Drive line exit site cleaned with Vashe x 2, allowed to  dry, and gauze dressing with Vashe moistened 2 x 2 applied around driveline. Exit site healed and incorporated, the velour is fully implanted at exit site. No redness, tenderness, drainage, foul odor or rash noted. Small scab around driveline removed with cleaning. Drive line anchor re-applied. Pt denies fever or chills. Pt given adhesive remover, 8 daily kits and 8 anchors.        Device: N/A   BP & Labs:  Doppler 88 - correlates with modified systolic.   Hgb 14.4 -No S/S of bleeding. Specifically denies BRBPR.   LDH stable at 212  with established baseline of 150 - 220. Denies tea-colored urine. No power elevations noted on interrogation.   Patient Instructions: No change in medications Coumadin  dosing by [facility] Return to clinic in 2 months   Schuyler Lunger RN,BSN VAD Coordinator  Office: [phone]  24/7 Pager: [phone]

## 2024-10-20 ENCOUNTER — Encounter (HOSPITAL_COMMUNITY)
Admission: RE | Admit: 2024-10-20 | Discharge: 2024-10-20 | Disposition: A | Discharge status: Home / Self Care | Source: Ambulatory Visit | Attending: Cardiology | Admitting: Cardiology

## 2024-10-20 VITALS — BP 99/86 | HR 79 | Temp 98.0°F | Resp 16

## 2024-10-20 DIAGNOSIS — K5521 Angiodysplasia of colon with hemorrhage: Secondary | ICD-10-CM | POA: Diagnosis present

## 2024-10-20 DIAGNOSIS — Z95811 Presence of heart assist device: Secondary | ICD-10-CM | POA: Insufficient documentation

## 2024-10-20 MED ORDER — OCTREOTIDE ACETATE 20 MG IM KIT
20.0000 mg | PACK | Freq: Once | INTRAMUSCULAR | Status: AC
  Administered 2024-10-20: 20 mg via INTRAMUSCULAR

## 2024-10-20 MED ORDER — OCTREOTIDE ACETATE 20 MG IM KIT
PACK | INTRAMUSCULAR | Status: AC
  Filled 2024-10-20: qty 1

## 2024-10-26 ENCOUNTER — Ambulatory Visit (HOSPITAL_COMMUNITY): Payer: Self-pay | Admitting: Pharmacist

## 2024-10-26 DIAGNOSIS — Z95811 Presence of heart assist device: Secondary | ICD-10-CM

## 2024-10-26 LAB — POCT INR: INR: 1.9 — AB (ref 2.0–3.0)

## 2024-11-02 ENCOUNTER — Ambulatory Visit (HOSPITAL_COMMUNITY): Payer: Self-pay | Admitting: Pharmacist

## 2024-11-02 DIAGNOSIS — Z95811 Presence of heart assist device: Secondary | ICD-10-CM

## 2024-11-02 LAB — POCT INR: INR: 2.1 (ref 2.0–3.0)

## 2024-11-09 ENCOUNTER — Ambulatory Visit (HOSPITAL_COMMUNITY): Payer: Self-pay | Admitting: Pharmacist

## 2024-11-09 DIAGNOSIS — Z95811 Presence of heart assist device: Secondary | ICD-10-CM

## 2024-11-09 LAB — POCT INR: INR: 1.9 — AB (ref 2.0–3.0)

## 2024-11-12 ENCOUNTER — Telehealth (HOSPITAL_COMMUNITY): Payer: Self-pay | Admitting: Pharmacy Technician

## 2024-11-12 NOTE — Telephone Encounter (Signed)
 Auth Submission: NO AUTH NEEDED Site of care: [facility] Payer: [employer] DUAL  Medication & CPT/J Code(s) submitted: Sandostatin  (Octreotide  LAR) G7646 Diagnosis Code: Z95.811, K55.21 Route of submission (phone, fax, portal):  Phone # Fax # Auth type: Buy/Bill HB Units/visits requested: 20mg  every 28 days Reference number: [id number] Approval from: 11/12/2024 to 11/12/25    Dagoberto Armour, CPhT [facility] Phone: [phone] 11/12/2024

## 2024-11-16 ENCOUNTER — Ambulatory Visit (HOSPITAL_COMMUNITY): Payer: Self-pay | Admitting: Pharmacist

## 2024-11-16 DIAGNOSIS — Z95811 Presence of heart assist device: Secondary | ICD-10-CM

## 2024-11-16 LAB — POCT INR: INR: 2.3 (ref 2.0–3.0)

## 2024-11-17 ENCOUNTER — Encounter (HOSPITAL_COMMUNITY)
Admission: RE | Admit: 2024-11-17 | Discharge: 2024-11-17 | Disposition: A | Discharge status: Home / Self Care | Source: Ambulatory Visit | Attending: Cardiology | Admitting: Cardiology

## 2024-11-17 VITALS — BP 93/78 | HR 87 | Temp 98.3°F | Resp 15

## 2024-11-17 DIAGNOSIS — K5521 Angiodysplasia of colon with hemorrhage: Secondary | ICD-10-CM

## 2024-11-17 DIAGNOSIS — Z95811 Presence of heart assist device: Secondary | ICD-10-CM | POA: Diagnosis not present

## 2024-11-17 MED ORDER — OCTREOTIDE ACETATE 20 MG IM KIT
PACK | INTRAMUSCULAR | Status: AC
  Filled 2024-11-17: qty 1

## 2024-11-17 MED ORDER — OCTREOTIDE ACETATE 20 MG IM KIT
20.0000 mg | PACK | Freq: Once | INTRAMUSCULAR | Status: AC
  Administered 2024-11-17: 20 mg via INTRAMUSCULAR

## 2024-11-23 ENCOUNTER — Ambulatory Visit (HOSPITAL_COMMUNITY): Payer: Self-pay | Admitting: Pharmacist

## 2024-11-23 LAB — POCT INR: INR: 1.9 — AB (ref 2.0–3.0)

## 2024-11-30 ENCOUNTER — Ambulatory Visit (HOSPITAL_COMMUNITY): Payer: Self-pay | Admitting: Pharmacist

## 2024-11-30 LAB — POCT INR: INR: 2.2 (ref 2.0–3.0)

## 2024-12-07 ENCOUNTER — Ambulatory Visit (HOSPITAL_COMMUNITY): Payer: Self-pay | Admitting: Pharmacist

## 2024-12-07 LAB — POCT INR: INR: 1.9 — AB (ref 2.0–3.0)

## 2024-12-09 ENCOUNTER — Telehealth (HOSPITAL_COMMUNITY): Payer: Self-pay
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
## 2024-12-15 ENCOUNTER — Ambulatory Visit (HOSPITAL_COMMUNITY)
Admission: RE | Admit: 2024-12-15 | Discharge: 2024-12-15 | Disposition: A | Discharge status: Home / Self Care | Source: Ambulatory Visit | Attending: Cardiology | Admitting: Cardiology

## 2024-12-15 VITALS — BP 85/72 | HR 86 | Temp 98.1°F | Resp 16

## 2024-12-15 DIAGNOSIS — Z95811 Presence of heart assist device: Secondary | ICD-10-CM | POA: Diagnosis present

## 2024-12-15 DIAGNOSIS — K5521 Angiodysplasia of colon with hemorrhage: Secondary | ICD-10-CM | POA: Insufficient documentation

## 2024-12-15 MED ORDER — OCTREOTIDE ACETATE 20 MG IM KIT
20.0000 mg | PACK | Freq: Once | INTRAMUSCULAR | Status: AC
  Administered 2024-12-15: 20 mg via INTRAMUSCULAR

## 2024-12-15 MED ORDER — OCTREOTIDE ACETATE 20 MG IM KIT
PACK | INTRAMUSCULAR | Status: AC
  Filled 2024-12-15: qty 1

## 2024-12-16 ENCOUNTER — Telehealth (HOSPITAL_COMMUNITY): Payer: Self-pay | Admitting: Cardiology

## 2024-12-16 ENCOUNTER — Ambulatory Visit (HOSPITAL_COMMUNITY): Payer: Self-pay | Admitting: Pharmacist

## 2024-12-16 LAB — POCT INR: INR: 2.1 (ref 2.0–3.0)

## 2024-12-16 NOTE — Telephone Encounter (Signed)
 The patient last received Octreotide  20 mg IM on 12/15/24. The provider was notified that the current orders have expired and new orders are required prior to the next appointment. The providers office will submit updated orders.  Thank you,  Norton Blush, PharmD, [facility] Pharmacist [facility]

## 2024-12-17 ENCOUNTER — Other Ambulatory Visit (HOSPITAL_COMMUNITY): Payer: Self-pay | Admitting: Unknown Physician Specialty

## 2024-12-17 DIAGNOSIS — Z95811 Presence of heart assist device: Secondary | ICD-10-CM

## 2024-12-17 DIAGNOSIS — K5521 Angiodysplasia of colon with hemorrhage: Secondary | ICD-10-CM

## 2024-12-20 ENCOUNTER — Ambulatory Visit (HOSPITAL_COMMUNITY): Payer: Self-pay | Admitting: Cardiology

## 2024-12-20 ENCOUNTER — Ambulatory Visit (HOSPITAL_COMMUNITY)
Admission: RE | Admit: 2024-12-20 | Discharge: 2024-12-20 | Disposition: A | Discharge status: Home / Self Care | Source: Ambulatory Visit | Attending: Cardiology | Admitting: Cardiology

## 2024-12-20 ENCOUNTER — Other Ambulatory Visit (HOSPITAL_COMMUNITY): Payer: Self-pay | Admitting: *Deleted

## 2024-12-20 VITALS — BP 105/90 | HR 72 | Wt 189.5 lb

## 2024-12-20 DIAGNOSIS — I159 Secondary hypertension, unspecified: Secondary | ICD-10-CM

## 2024-12-20 DIAGNOSIS — Z7901 Long term (current) use of anticoagulants: Secondary | ICD-10-CM | POA: Insufficient documentation

## 2024-12-20 DIAGNOSIS — F17210 Nicotine dependence, cigarettes, uncomplicated: Secondary | ICD-10-CM | POA: Insufficient documentation

## 2024-12-20 DIAGNOSIS — Z95811 Presence of heart assist device: Secondary | ICD-10-CM

## 2024-12-20 DIAGNOSIS — I2510 Atherosclerotic heart disease of native coronary artery without angina pectoris: Secondary | ICD-10-CM | POA: Insufficient documentation

## 2024-12-20 DIAGNOSIS — Z86718 Personal history of other venous thrombosis and embolism: Secondary | ICD-10-CM | POA: Insufficient documentation

## 2024-12-20 DIAGNOSIS — I5022 Chronic systolic (congestive) heart failure: Secondary | ICD-10-CM | POA: Insufficient documentation

## 2024-12-20 DIAGNOSIS — I428 Other cardiomyopathies: Secondary | ICD-10-CM | POA: Insufficient documentation

## 2024-12-20 DIAGNOSIS — L299 Pruritus, unspecified: Secondary | ICD-10-CM | POA: Diagnosis not present

## 2024-12-20 DIAGNOSIS — E119 Type 2 diabetes mellitus without complications: Secondary | ICD-10-CM | POA: Insufficient documentation

## 2024-12-20 DIAGNOSIS — T827XXA Infection and inflammatory reaction due to other cardiac and vascular devices, implants and grafts, initial encounter: Secondary | ICD-10-CM

## 2024-12-20 DIAGNOSIS — G4733 Obstructive sleep apnea (adult) (pediatric): Secondary | ICD-10-CM | POA: Insufficient documentation

## 2024-12-20 DIAGNOSIS — Z79899 Other long term (current) drug therapy: Secondary | ICD-10-CM | POA: Insufficient documentation

## 2024-12-20 DIAGNOSIS — Y831 Surgical operation with implant of artificial internal device as the cause of abnormal reaction of the patient, or of later complication, without mention of misadventure at the time of the procedure: Secondary | ICD-10-CM | POA: Insufficient documentation

## 2024-12-20 LAB — LACTATE DEHYDROGENASE: LDH: 329 U/L — ABNORMAL HIGH (ref 105–235)

## 2024-12-20 LAB — CBC
HCT: 43.4 % (ref 39.0–52.0)
Hemoglobin: 14.3 g/dL (ref 13.0–17.0)
MCH: 29.2 pg (ref 26.0–34.0)
MCHC: 32.9 g/dL (ref 30.0–36.0)
MCV: 88.8 fL (ref 80.0–100.0)
Platelets: 243 10*3/uL (ref 150–400)
RBC: 4.89 MIL/uL (ref 4.22–5.81)
RDW: 14.6 % (ref 11.5–15.5)
WBC: 10.4 10*3/uL (ref 4.0–10.5)
nRBC: 0 % (ref 0.0–0.2)

## 2024-12-20 LAB — BASIC METABOLIC PANEL WITH GFR
Anion gap: 11 (ref 5–15)
BUN: 13 mg/dL (ref 8–23)
CO2: 23 mmol/L (ref 22–32)
Calcium: 9.2 mg/dL (ref 8.9–10.3)
Chloride: 102 mmol/L (ref 98–111)
Creatinine, Ser: 1.16 mg/dL (ref 0.61–1.24)
GFR, Estimated: >60 mL/min
Glucose, Bld: 152 mg/dL — ABNORMAL HIGH (ref 70–99)
Potassium: 4.9 mmol/L (ref 3.5–5.1)
Sodium: 137 mmol/L (ref 135–145)

## 2024-12-20 LAB — PROTIME-INR
INR: 1.7 — ABNORMAL HIGH (ref 0.8–1.2)
Prothrombin Time: 20.4 s — ABNORMAL HIGH (ref 11.4–15.2)

## 2024-12-20 MED ORDER — HYDROXYZINE HCL 50 MG PO TABS: 50.0000 mg | ORAL_TABLET | Freq: Three times a day (TID) | ORAL | 6 refills | Status: AC | PRN

## 2024-12-20 MED ORDER — CEPHALEXIN 500 MG PO CAPS: 500.0000 mg | ORAL_CAPSULE | Freq: Four times a day (QID) | ORAL | 5 refills | Status: AC

## 2024-12-21 ENCOUNTER — Ambulatory Visit (HOSPITAL_COMMUNITY): Payer: Self-pay

## 2024-12-21 NOTE — Addendum Note (Signed)
 Encounter addended by: Berdine Isaiah NOVAK, RN on: 12/21/2024 11:08 AM  Actions taken: Charge Capture section accepted

## 2024-12-23 ENCOUNTER — Telehealth: Payer: Self-pay | Admitting: Infectious Diseases

## 2024-12-23 ENCOUNTER — Other Ambulatory Visit (HOSPITAL_COMMUNITY): Payer: Self-pay | Admitting: *Deleted

## 2024-12-23 DIAGNOSIS — Z95811 Presence of heart assist device: Secondary | ICD-10-CM

## 2024-12-23 DIAGNOSIS — T827XXA Infection and inflammatory reaction due to other cardiac and vascular devices, implants and grafts, initial encounter: Secondary | ICD-10-CM

## 2024-12-23 DIAGNOSIS — I5022 Chronic systolic (congestive) heart failure: Secondary | ICD-10-CM

## 2024-12-23 DIAGNOSIS — Z79899 Other long term (current) drug therapy: Secondary | ICD-10-CM

## 2024-12-23 DIAGNOSIS — L02211 Cutaneous abscess of abdominal wall: Secondary | ICD-10-CM

## 2024-12-23 DIAGNOSIS — Z7901 Long term (current) use of anticoagulants: Secondary | ICD-10-CM

## 2024-12-23 LAB — AEROBIC CULTURE W GRAM STAIN (SUPERFICIAL SPECIMEN): Gram Stain: NONE SEEN

## 2024-12-23 NOTE — Telephone Encounter (Signed)
 Initially contacted on 2 February with Dr. Rolan LVAD team regarding a small abscess that formed on the abdomen adjacent to his driveline exit site.  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  We switched him from cephalexin  to cefadroxil  in November for ease of twice daily dosing.  Photo reviewed of the site at the time of the clinic visit.  Small fluctuant area without overt cellulitis that was opened up and revealed some thicker yellow drainage and blood.  The site was cultured.  Theodore Welch requested to be put back on his cephalexin  instead of the cefadroxil . I do not think this is going to offer much benefit regarding spectrum of coverage, but we will increase him to a more treatment dose with 500 mg cephalexin  4 times daily and follow up his Gram stain for any other changes that would require escalation of gram-negative or MRSA coverage.     12/23/2024  Update today -Gram stain was negative no white blood cells detected with final culture revealing mixed flora.  No Staph aureus identified no group A strep identified I called micro to discuss and there is also no Pseudomonas identified.   I am hopeful that expressing the small area of fluctuance is gena be enough with increasing the dose of the cephalexin  to target MSSA.    If on Monday things do not look improved or if he calls with an update over the weekend, I would recommend broadening treatment with linezolid  600 mg twice daily plus Augmentin 875/125 mg twice daily.  We do not need pseudomonal coverage and without a predominating gram-negative target Augmentin should work well w/o any risk for INR impact. This will also include MSSA coverage we know we need to keep.   His white blood cell count is normal he has been recommended increased frequency of dressing care which I agree with to help decrease bioburden.   I personally spent a total of  22 minutes in the care of the patient including review of the previous records, micro and lab data and communication with consulting team. No face to face exam was performed.    [name], MSN, NP-C [facility]  [email or web address] Pager: [phone] Office: [phone] [facility] Main Line: [phone] *Secure Chat Communication Welcome

## 2024-12-27 ENCOUNTER — Ambulatory Visit (HOSPITAL_COMMUNITY): Admitting: Cardiology

## 2024-12-27 ENCOUNTER — Ambulatory Visit (HOSPITAL_COMMUNITY)

## 2025-01-12 ENCOUNTER — Inpatient Hospital Stay (HOSPITAL_COMMUNITY): Admission: RE | Admit: 2025-01-12 | Source: Ambulatory Visit

## 2025-03-21 ENCOUNTER — Ambulatory Visit: Admitting: Infectious Diseases
